# Patient Record
Sex: Male | Born: 1960 | Race: Black or African American | Hispanic: No | Marital: Single | Smoking: Never smoker
Health system: Southern US, Community
[De-identification: ages and names within clinical notes are randomized; demographics above are authoritative.]

## PROBLEM LIST (undated history)

## (undated) DIAGNOSIS — I10 Essential (primary) hypertension: Secondary | ICD-10-CM

## (undated) DIAGNOSIS — I639 Cerebral infarction, unspecified: Secondary | ICD-10-CM

## (undated) DIAGNOSIS — M1990 Unspecified osteoarthritis, unspecified site: Secondary | ICD-10-CM

## (undated) DIAGNOSIS — E119 Type 2 diabetes mellitus without complications: Secondary | ICD-10-CM

## (undated) DIAGNOSIS — M5412 Radiculopathy, cervical region: Secondary | ICD-10-CM

## (undated) DIAGNOSIS — F329 Major depressive disorder, single episode, unspecified: Secondary | ICD-10-CM

## (undated) DIAGNOSIS — I77810 Thoracic aortic ectasia: Secondary | ICD-10-CM

## (undated) DIAGNOSIS — K589 Irritable bowel syndrome without diarrhea: Secondary | ICD-10-CM

## (undated) DIAGNOSIS — I219 Acute myocardial infarction, unspecified: Secondary | ICD-10-CM

## (undated) DIAGNOSIS — K920 Hematemesis: Secondary | ICD-10-CM

## (undated) DIAGNOSIS — D649 Anemia, unspecified: Secondary | ICD-10-CM

## (undated) DIAGNOSIS — K219 Gastro-esophageal reflux disease without esophagitis: Secondary | ICD-10-CM

## (undated) DIAGNOSIS — F32A Depression, unspecified: Secondary | ICD-10-CM

## (undated) DIAGNOSIS — N289 Disorder of kidney and ureter, unspecified: Secondary | ICD-10-CM

## (undated) DIAGNOSIS — K760 Fatty (change of) liver, not elsewhere classified: Secondary | ICD-10-CM

## (undated) DIAGNOSIS — G629 Polyneuropathy, unspecified: Secondary | ICD-10-CM

## (undated) DIAGNOSIS — K279 Peptic ulcer, site unspecified, unspecified as acute or chronic, without hemorrhage or perforation: Secondary | ICD-10-CM

## (undated) DIAGNOSIS — M549 Dorsalgia, unspecified: Secondary | ICD-10-CM

## (undated) DIAGNOSIS — K859 Acute pancreatitis without necrosis or infection, unspecified: Secondary | ICD-10-CM

## (undated) DIAGNOSIS — I82409 Acute embolism and thrombosis of unspecified deep veins of unspecified lower extremity: Secondary | ICD-10-CM

## (undated) DIAGNOSIS — I2510 Atherosclerotic heart disease of native coronary artery without angina pectoris: Secondary | ICD-10-CM

## (undated) DIAGNOSIS — G8929 Other chronic pain: Secondary | ICD-10-CM

## (undated) DIAGNOSIS — I2699 Other pulmonary embolism without acute cor pulmonale: Secondary | ICD-10-CM

## (undated) DIAGNOSIS — T783XXA Angioneurotic edema, initial encounter: Secondary | ICD-10-CM

## (undated) DIAGNOSIS — I5032 Chronic diastolic (congestive) heart failure: Secondary | ICD-10-CM

## (undated) DIAGNOSIS — R0600 Dyspnea, unspecified: Secondary | ICD-10-CM

## (undated) DIAGNOSIS — G4733 Obstructive sleep apnea (adult) (pediatric): Secondary | ICD-10-CM

## (undated) DIAGNOSIS — E785 Hyperlipidemia, unspecified: Secondary | ICD-10-CM

## (undated) HISTORY — PX: OTHER SURGICAL HISTORY: SHX169

## (undated) HISTORY — DX: Hyperlipidemia, unspecified: E78.5

## (undated) HISTORY — DX: Type 2 diabetes mellitus without complications: E11.9

## (undated) HISTORY — DX: Obstructive sleep apnea (adult) (pediatric): G47.33

## (undated) HISTORY — DX: Irritable bowel syndrome, unspecified: K58.9

## (undated) HISTORY — DX: Essential (primary) hypertension: I10

## (undated) HISTORY — DX: Peptic ulcer, site unspecified, unspecified as acute or chronic, without hemorrhage or perforation: K27.9

## (undated) HISTORY — DX: Anemia, unspecified: D64.9

## (undated) HISTORY — DX: Atherosclerotic heart disease of native coronary artery without angina pectoris: I25.10

## (undated) HISTORY — DX: Acute pancreatitis without necrosis or infection, unspecified: K85.90

## (undated) HISTORY — DX: Angioneurotic edema, initial encounter: T78.3XXA

---

## 2000-08-31 ENCOUNTER — Encounter: Payer: Self-pay | Admitting: Emergency Medicine

## 2000-08-31 ENCOUNTER — Emergency Department (HOSPITAL_COMMUNITY): Admission: EM | Admit: 2000-08-31 | Discharge: 2000-08-31 | Payer: Self-pay | Admitting: Emergency Medicine

## 2001-10-30 ENCOUNTER — Emergency Department (HOSPITAL_COMMUNITY): Admission: EM | Admit: 2001-10-30 | Discharge: 2001-10-30 | Payer: Self-pay

## 2002-07-19 ENCOUNTER — Encounter: Payer: Self-pay | Admitting: Emergency Medicine

## 2002-07-19 ENCOUNTER — Emergency Department (HOSPITAL_COMMUNITY): Admission: EM | Admit: 2002-07-19 | Discharge: 2002-07-19 | Payer: Self-pay | Admitting: Emergency Medicine

## 2003-04-07 ENCOUNTER — Emergency Department (HOSPITAL_COMMUNITY): Admission: EM | Admit: 2003-04-07 | Discharge: 2003-04-07 | Payer: Self-pay | Admitting: Emergency Medicine

## 2003-04-07 ENCOUNTER — Encounter: Payer: Self-pay | Admitting: Emergency Medicine

## 2003-05-12 ENCOUNTER — Emergency Department (HOSPITAL_COMMUNITY): Admission: EM | Admit: 2003-05-12 | Discharge: 2003-05-12 | Payer: Self-pay | Admitting: *Deleted

## 2003-05-12 ENCOUNTER — Encounter: Payer: Self-pay | Admitting: Emergency Medicine

## 2003-05-13 ENCOUNTER — Emergency Department (HOSPITAL_COMMUNITY): Admission: EM | Admit: 2003-05-13 | Discharge: 2003-05-14 | Payer: Self-pay | Admitting: Emergency Medicine

## 2003-05-13 ENCOUNTER — Encounter: Payer: Self-pay | Admitting: Emergency Medicine

## 2003-06-30 ENCOUNTER — Emergency Department (HOSPITAL_COMMUNITY): Admission: EM | Admit: 2003-06-30 | Discharge: 2003-06-30 | Payer: Self-pay | Admitting: Emergency Medicine

## 2003-09-16 ENCOUNTER — Emergency Department (HOSPITAL_COMMUNITY): Admission: AD | Admit: 2003-09-16 | Discharge: 2003-09-16 | Payer: Self-pay | Admitting: Family Medicine

## 2003-11-24 ENCOUNTER — Emergency Department (HOSPITAL_COMMUNITY): Admission: EM | Admit: 2003-11-24 | Discharge: 2003-11-25 | Payer: Self-pay | Admitting: Emergency Medicine

## 2003-11-28 ENCOUNTER — Emergency Department (HOSPITAL_COMMUNITY): Admission: EM | Admit: 2003-11-28 | Discharge: 2003-11-28 | Payer: Self-pay | Admitting: Emergency Medicine

## 2003-12-08 ENCOUNTER — Ambulatory Visit (HOSPITAL_COMMUNITY): Admission: RE | Admit: 2003-12-08 | Discharge: 2003-12-08 | Payer: Self-pay | Admitting: Internal Medicine

## 2003-12-09 ENCOUNTER — Inpatient Hospital Stay (HOSPITAL_COMMUNITY): Admission: EM | Admit: 2003-12-09 | Discharge: 2003-12-12 | Payer: Self-pay | Admitting: Emergency Medicine

## 2003-12-10 ENCOUNTER — Encounter (INDEPENDENT_AMBULATORY_CARE_PROVIDER_SITE_OTHER): Payer: Self-pay | Admitting: Specialist

## 2004-03-15 ENCOUNTER — Encounter (INDEPENDENT_AMBULATORY_CARE_PROVIDER_SITE_OTHER): Payer: Self-pay | Admitting: *Deleted

## 2004-03-15 ENCOUNTER — Ambulatory Visit (HOSPITAL_COMMUNITY): Admission: RE | Admit: 2004-03-15 | Discharge: 2004-03-15 | Payer: Self-pay | Admitting: *Deleted

## 2004-07-01 ENCOUNTER — Ambulatory Visit: Payer: Self-pay | Admitting: Family Medicine

## 2004-07-01 ENCOUNTER — Ambulatory Visit: Payer: Self-pay | Admitting: *Deleted

## 2004-07-26 ENCOUNTER — Ambulatory Visit: Payer: Self-pay | Admitting: Family Medicine

## 2007-08-17 ENCOUNTER — Emergency Department (HOSPITAL_COMMUNITY): Admission: EM | Admit: 2007-08-17 | Discharge: 2007-08-17 | Payer: Self-pay | Admitting: *Deleted

## 2007-08-24 ENCOUNTER — Emergency Department (HOSPITAL_COMMUNITY): Admission: EM | Admit: 2007-08-24 | Discharge: 2007-08-24 | Payer: Self-pay | Admitting: Family Medicine

## 2009-09-22 ENCOUNTER — Emergency Department (HOSPITAL_COMMUNITY): Admission: EM | Admit: 2009-09-22 | Discharge: 2009-09-22 | Payer: Self-pay | Admitting: Emergency Medicine

## 2009-10-09 ENCOUNTER — Emergency Department (HOSPITAL_COMMUNITY): Admission: EM | Admit: 2009-10-09 | Discharge: 2009-10-09 | Payer: Self-pay | Admitting: Emergency Medicine

## 2009-10-13 ENCOUNTER — Ambulatory Visit (HOSPITAL_COMMUNITY): Admission: RE | Admit: 2009-10-13 | Discharge: 2009-10-13 | Payer: Self-pay | Admitting: Gastroenterology

## 2009-11-30 ENCOUNTER — Emergency Department (HOSPITAL_COMMUNITY): Admission: EM | Admit: 2009-11-30 | Discharge: 2009-11-30 | Payer: Self-pay | Admitting: Emergency Medicine

## 2009-12-02 ENCOUNTER — Emergency Department (HOSPITAL_COMMUNITY): Admission: EM | Admit: 2009-12-02 | Discharge: 2009-12-02 | Payer: Self-pay | Admitting: Emergency Medicine

## 2009-12-08 ENCOUNTER — Ambulatory Visit: Payer: Self-pay | Admitting: Internal Medicine

## 2009-12-22 ENCOUNTER — Ambulatory Visit: Payer: Self-pay | Admitting: Family Medicine

## 2009-12-23 ENCOUNTER — Encounter (INDEPENDENT_AMBULATORY_CARE_PROVIDER_SITE_OTHER): Payer: Self-pay | Admitting: Family Medicine

## 2009-12-23 ENCOUNTER — Ambulatory Visit: Payer: Self-pay | Admitting: Internal Medicine

## 2009-12-23 LAB — CONVERTED CEMR LAB
ALT: 34 units/L (ref 0–53)
AST: 25 units/L (ref 0–37)
Albumin: 4.6 g/dL (ref 3.5–5.2)
Alkaline Phosphatase: 33 units/L — ABNORMAL LOW (ref 39–117)
BUN: 17 mg/dL (ref 6–23)
Basophils Absolute: 0 10*3/uL (ref 0.0–0.1)
Basophils Relative: 0 % (ref 0–1)
CO2: 26 meq/L (ref 19–32)
Calcium: 9.5 mg/dL (ref 8.4–10.5)
Chloride: 101 meq/L (ref 96–112)
Cholesterol: 213 mg/dL — ABNORMAL HIGH (ref 0–200)
Creatinine, Ser: 0.98 mg/dL (ref 0.40–1.50)
Eosinophils Absolute: 0.1 10*3/uL (ref 0.0–0.7)
Eosinophils Relative: 1 % (ref 0–5)
Glucose, Bld: 98 mg/dL (ref 70–99)
HCT: 40.1 % (ref 39.0–52.0)
HDL: 50 mg/dL (ref >39)
Hemoglobin: 13.4 g/dL (ref 13.0–17.0)
LDL Cholesterol: 102 mg/dL — ABNORMAL HIGH (ref 0–99)
Lymphocytes Relative: 26 % (ref 12–46)
Lymphs Abs: 2.5 10*3/uL (ref 0.7–4.0)
MCHC: 33.4 g/dL (ref 30.0–36.0)
MCV: 93 fL (ref 78.0–100.0)
Monocytes Absolute: 0.8 10*3/uL (ref 0.1–1.0)
Monocytes Relative: 8 % (ref 3–12)
Neutro Abs: 6.1 10*3/uL (ref 1.7–7.7)
Neutrophils Relative %: 64 % (ref 43–77)
Platelets: 293 10*3/uL (ref 150–400)
Potassium: 4.3 meq/L (ref 3.5–5.3)
RBC: 4.31 M/uL (ref 4.22–5.81)
RDW: 13.5 % (ref 11.5–15.5)
Sodium: 140 meq/L (ref 135–145)
Total Bilirubin: 0.4 mg/dL (ref 0.3–1.2)
Total CHOL/HDL Ratio: 4.3
Total Protein: 8.1 g/dL (ref 6.0–8.3)
Triglycerides: 303 mg/dL — ABNORMAL HIGH (ref <150)
VLDL: 61 mg/dL — ABNORMAL HIGH (ref 0–40)
WBC: 9.5 10*3/uL (ref 4.0–10.5)

## 2010-04-05 ENCOUNTER — Encounter (INDEPENDENT_AMBULATORY_CARE_PROVIDER_SITE_OTHER): Payer: Self-pay | Admitting: Family Medicine

## 2010-04-05 ENCOUNTER — Ambulatory Visit: Payer: Self-pay | Admitting: Internal Medicine

## 2010-04-05 LAB — CONVERTED CEMR LAB
BUN: 15 mg/dL (ref 6–23)
CO2: 27 meq/L (ref 19–32)
Calcium: 10 mg/dL (ref 8.4–10.5)
Chloride: 104 meq/L (ref 96–112)
Creatinine, Ser: 0.92 mg/dL (ref 0.40–1.50)
Glucose, Bld: 98 mg/dL (ref 70–99)
Potassium: 4 meq/L (ref 3.5–5.3)
Sodium: 142 meq/L (ref 135–145)

## 2010-04-09 ENCOUNTER — Ambulatory Visit (HOSPITAL_COMMUNITY): Admission: RE | Admit: 2010-04-09 | Discharge: 2010-04-09 | Payer: Self-pay | Admitting: Internal Medicine

## 2010-07-06 ENCOUNTER — Ambulatory Visit: Payer: Self-pay | Admitting: Internal Medicine

## 2011-01-12 LAB — POCT CARDIAC MARKERS
CKMB, poc: 1.1 ng/mL (ref 1.0–8.0)
Myoglobin, poc: 88.4 ng/mL (ref 12–200)
Troponin i, poc: <0.05 ng/mL (ref 0.00–0.09)

## 2011-01-24 LAB — POCT I-STAT, CHEM 8
BUN: 15 mg/dL (ref 6–23)
Calcium, Ion: 1.17 mmol/L (ref 1.12–1.32)
Chloride: 104 mEq/L (ref 96–112)
Creatinine, Ser: 0.8 mg/dL (ref 0.4–1.5)
Glucose, Bld: 99 mg/dL (ref 70–99)
HCT: 43 % (ref 39.0–52.0)
Hemoglobin: 14.6 g/dL (ref 13.0–17.0)
Potassium: 3.9 mEq/L (ref 3.5–5.1)
Sodium: 138 mEq/L (ref 135–145)
TCO2: 27 mmol/L (ref 0–100)

## 2011-01-26 LAB — BASIC METABOLIC PANEL
BUN: 14 mg/dL (ref 6–23)
CO2: 23 mEq/L (ref 19–32)
Calcium: 9.4 mg/dL (ref 8.4–10.5)
Chloride: 104 mEq/L (ref 96–112)
Creatinine, Ser: 0.96 mg/dL (ref 0.4–1.5)
GFR calc Af Amer: >60 mL/min (ref >60)
GFR calc non Af Amer: >60 mL/min (ref >60)
Glucose, Bld: 128 mg/dL — ABNORMAL HIGH (ref 70–99)
Potassium: 3.8 mEq/L (ref 3.5–5.1)
Sodium: 138 mEq/L (ref 135–145)

## 2011-01-26 LAB — TYPE AND SCREEN
ABO/RH(D): O POS
Antibody Screen: NEGATIVE

## 2011-01-26 LAB — CBC
HCT: 40.3 % (ref 39.0–52.0)
Hemoglobin: 14.1 g/dL (ref 13.0–17.0)
MCHC: 35 g/dL (ref 30.0–36.0)
MCV: 94.3 fL (ref 78.0–100.0)
Platelets: 275 10*3/uL (ref 150–400)
RBC: 4.28 MIL/uL (ref 4.22–5.81)
RDW: 13.4 % (ref 11.5–15.5)
WBC: 8.6 10*3/uL (ref 4.0–10.5)

## 2011-01-26 LAB — PROTIME-INR
INR: 0.94 (ref 0.00–1.49)
Prothrombin Time: 12.5 seconds (ref 11.6–15.2)

## 2011-01-26 LAB — DIFFERENTIAL
Basophils Absolute: 0 10*3/uL (ref 0.0–0.1)
Basophils Relative: 0 % (ref 0–1)
Eosinophils Absolute: 0.1 10*3/uL (ref 0.0–0.7)
Eosinophils Relative: 1 % (ref 0–5)
Lymphocytes Relative: 27 % (ref 12–46)
Lymphs Abs: 2.3 10*3/uL (ref 0.7–4.0)
Monocytes Absolute: 0.6 10*3/uL (ref 0.1–1.0)
Monocytes Relative: 8 % (ref 3–12)
Neutro Abs: 5.5 10*3/uL (ref 1.7–7.7)
Neutrophils Relative %: 64 % (ref 43–77)

## 2011-01-26 LAB — HEMOCCULT GUIAC POC 1CARD (OFFICE): Fecal Occult Bld: POSITIVE

## 2011-01-26 LAB — ABO/RH: ABO/RH(D): O POS

## 2011-01-26 LAB — APTT: aPTT: 32 seconds (ref 24–37)

## 2011-02-04 ENCOUNTER — Inpatient Hospital Stay (INDEPENDENT_AMBULATORY_CARE_PROVIDER_SITE_OTHER)
Admission: RE | Admit: 2011-02-04 | Discharge: 2011-02-04 | Disposition: A | Discharge status: Home / Self Care | Payer: Self-pay | Source: Ambulatory Visit | Attending: Emergency Medicine | Admitting: Emergency Medicine

## 2011-02-04 DIAGNOSIS — I10 Essential (primary) hypertension: Secondary | ICD-10-CM

## 2011-02-04 DIAGNOSIS — R51 Headache: Secondary | ICD-10-CM

## 2011-02-04 LAB — GLUCOSE, CAPILLARY: Glucose-Capillary: 106 mg/dL — ABNORMAL HIGH (ref 70–99)

## 2011-03-11 NOTE — Discharge Summary (Signed)
NAMEDALTYN, DEGROAT                        ACCOUNT NO.:  [id number]   MEDICAL RECORD NO.:  [id number]                   PATIENT TYPE:  INP   LOCATION:  6712                                 FACILITY:  [facility]   PHYSICIAN:  Jonna L. Robb Matar, M.D.            DATE OF BIRTH:  Apr 11, 1961   DATE OF ADMISSION:  12/09/2003  DATE OF DISCHARGE:  12/12/2003                                 DISCHARGE SUMMARY   PRIMARY CARE PHYSICIAN:  Unassigned.   FINAL DIAGNOSES:  1. Antral ulcer.  2. Duodenal ulcer.  3. Fatty liver.  4. Gallbladder sludge.  5. Splenic hemangioma.  6. Urinary tract infection.  7. Helicobacter pylori positive.   ALLERGIES:  None.   OPERATION/PROCEDURE:  None.   HISTORY OF PRESENT ILLNESS:  This 50 year old African-American male had a  two to three week history of nausea, vomiting, abdominal pain, and started  to develop coffee-ground vomitus three to four times a day. CT scan on  February 5 showed small splenic lesions. He had and upper GI that was  abnormal. He came back to the emergency room with an elevated white count  and fever and was evaluated. He had elevated LFTs.   Past history is notable for being a recovering drug addict residing at the  [facility].   ADMISSION PHYSICAL EXAMINATION:  VITAL SIGNS:  Temperature 99.0, blood  pressure 188/64.  ABDOMEN:  Unremarkable.   ADMISSION LABORATORY DATA:  White count had gone up to 17.4, positive  esterase and leukocytes.   HOSPITAL COURSE:  The patient's serologic studies for hepatitis came back  normal. He was seen in consultation by Dr. Evette Cristal who did an upper endoscopy  on him, which showed a 3 cm antral ulcer, small duodenal ulcers, and  esophagitis. The patient was in the meantime kept on IV fluids, IV Protonix,  and given Phenergan as needed. Evaluation of he liver and gallbladder with  ultrasound showed sludge and again a lesion in the spleen, which was  probably a benign splenic hemangioma.  The H. pylori test came back positive.   DISPOSITION:  The patient is to be discharge on a Prevpac for two weeks  followed by Prilosec 20 mg daily to fill out a two month course. He has been  instructed to avoid any anti-inflammatories, aspirin, not to eat hot spices.  He can probably return to work in about a week when he is feeling better. I  will ask Dr. Evette Cristal if he needs GI follow-up.                                                Jonna L. Robb Matar, M.D.    Dorna Bloom  D:  12/11/2003  T:  12/13/2003  Job:  [id number]

## 2011-03-11 NOTE — Op Note (Signed)
NAME:  [name], [name]                        ACCOUNT NO.:  [id number]   MEDICAL RECORD NO.:  [id number]                   PATIENT TYPE:  AMB   LOCATION:  ENDO                                 FACILITY:  [facility]   PHYSICIAN:  Graylin Shiver, M.D.                DATE OF BIRTH:  [birth year]   DATE OF PROCEDURE:  03/15/2004  DATE OF DISCHARGE:                                 OPERATIVE REPORT   PROCEDURE:  Esophagogastroduodenoscopy with biopsy.   INDICATIONS FOR PROCEDURE:  This patient is a 50 year old male who was found  to have a 3 cm gastric antral ulcer in February of this year by endoscopy.  He was positive for H. pylori and was treated for same.  He is currently on  Protonix.  The previous biopsies of the antral ulcer showed ulcerated  gastric mucosa with Helicobacter pylori and mild lymphoid atypia.   CONSENT:  Informed consent was obtained after explanation of the risks of  bleeding, infection, and perforation.   PREMEDICATION:  Fentanyl 60 mcg IV, Versed 6 mg IV.   PROCEDURE IN DETAIL:  With the patient in the left lateral decubitus  position, the Olympus gastroscope was inserted into the oropharynx and  passed into the esophagus.  It was advanced down the esophagus, into the  stomach, and into the duodenum.  The second portion of all the duodenum were  normal.  The stomach showed erythematous mucosa compatible with gastritis.  There was scarring in the antrum where the previous ulcer was, but the  ulcer, itself, were healed.  Biopsies were obtained from the area where the  ulcer was and we will check to see whether Helicobacter pylori is still  present or not.  The body of the stomach showed gastritis.  The fundus and  cardia of the stomach was unremarkable.  The esophagus was normal in its  entirety.  He tolerated the procedure well without complications.   IMPRESSION:  1. Healed gastric ulcer.  2. Gastritis.   PLAN:  Biopsies will be checked.                          Graylin Shiver, M.D.    [name]  D:  03/15/2004  T:  03/15/2004  Job:  [id number]

## 2011-03-11 NOTE — Consult Note (Signed)
NAME:  [name], [name]                        ACCOUNT NO.:  [id number]   MEDICAL RECORD NO.:  [id number]                   PATIENT TYPE:  INP   LOCATION:  6712                                 FACILITY:  [facility]   PHYSICIAN:  Graylin Shiver, M.D.                DATE OF BIRTH:  02-Jul-1961   DATE OF CONSULTATION:  12/10/2003  DATE OF DISCHARGE:                                   CONSULTATION   REASON FOR CONSULTATION:  This patient is a 50 year old African-American  male who is currently in the [facility], residing there.  He  is a recovering crack and cocaine addict.  He was admitted to the hospital  on December 09, 2003 because of persistent complaints of nausea and  vomiting, upper abdominal pain.  He has had this problem for the past 1-1/2  weeks.  He has noticed some coffee-grounds emesis at times.  He has been  unable to keep much of anything down.  He denies peptic ulcer disease.   He came to the emergency room a few days ago and was evaluated there.  He  had a CT scan of the abdomen which showed evidence of an old, splenic  infarct or pseudocyst. He had an upper GI series done, however, in talking  to the primary hospitalist, there was some confusion as to the results of  this, and it was unclear as to whether the reading was actually on this  patient or not.  Recently in the emergency room, he did have some elevation  of his liver enzymes as well.  The patient was admitted to the hospital for  further evaluation and treatment of his nausea and vomiting, and upper  abdominal pain.   PAST MEDICAL HISTORY:  No medical problems. He is a recovering crack cocaine  addict, and he has been abstinent since November.   PAST SURGICAL HISTORY:  None.   MEDICATIONS PRIOR TO ADMISSION:  Phenergan, ibuprofen, Amoxil, a proton pump  inhibitor (which he does not know the name).   ALLERGIES:  NONE KNOWN.   SOCIAL HISTORY:  He denies drinking alcohol.   REVIEW OF SYSTEMS:   Has noticed some fever and chills, denies any anginal  chest pains or shortness of breath, cough or sputum production. Denies any  dysuria.   PHYSICAL EXAMINATION:  GENERAL:  He does not appear in any acute distress at  this time.  HEENT:  Nonicteric.  NECK:  Supple.  LUNGS:  Clear.  CARDIOVASCULAR:  Regular rhythm, no murmurs are heard.  ABDOMEN:  Bowel sounds are normal, soft. There is some mild tenderness in  the epigastrium and right upper quadrant but no rebound, guarding or  hepatosplenomegaly.   IMPRESSION:  Nausea and vomiting, upper abdominal pain and some associated  coffee-grounds emesis.   PLAN:  Patient will have EGD today to evaluate the upper GI tract to see if  there is anything going  on that might explain this problem.  Also of  consideration could be his gallbladder. I will also order a gallbladder  ultrasound.  He is currently on Protonix 40 mg IV daily.                                               Graylin Shiver, M.D.    SFG/MEDQ  D:  12/10/2003  T:  12/10/2003  Job:  [id number]   cc:   Hettie Holstein, D.O.  Fax: [phone]

## 2011-03-11 NOTE — H&P (Signed)
NAME:  [name], [name]                        ACCOUNT NO.:  [id number]   MEDICAL RECORD NO.:  [id number]                   PATIENT TYPE:  EMS   LOCATION:  MINO                                 FACILITY:  [facility]   PHYSICIAN:  Hettie Holstein, D.O.                 DATE OF BIRTH:  [birth year]   DATE OF ADMISSION:  12/09/2003  DATE OF DISCHARGE:                                HISTORY & PHYSICAL   CHIEF COMPLAINT:  Intractable nausea, vomiting, inability to eat, and weight  loss.   HISTORY OF PRESENT ILLNESS:  This is a 50 year old African-American male  without remarkable past medical history, who has, for the past 2-3 weeks,  been having increased nausea and vomiting accompanied by abdominal pain that  began with a sore throat.  He recently developed some flecks of dark blood  along with his episodes of vomiting.  He states that he vomits upwards of 3-  4 times daily, irregardless of what he eats, and it is not always in  associated with eating.  He states the dark blood is a very small amount.  It did not start out with blood earlier.  He has been seen in the emergency  department here at [facility] 3 times.  He underwent CT scan on  November 29, 2003 that revealed only a 2.8 x 4 cm low density splenic lesion,  felt to be possibly due to splenic infarction or a pseudocyst related to old  trauma, and subsequently he underwent an upper GI series, though the results  of this are awaiting further clarification, as I have discussed, but not  with the radiologist.  He had some questions with regard to the previous  read, and we await further clarification.  In any event, today he presented  to the emergency department with similar complaints.  He had an elevated  white count and fever, and subsequently is being evaluated for further work-  up.   In review of the records back in his initial evaluation in November 29, 2003,  he had elevated LFT's that have resolved to this point.  They  have been as  high as 105/209 back on November 28, 2003, without elevations in his total  bilirubin.  He continues to remain Hemoccult negative, as tested here in the  emergency department.  His urinalysis is suggestive of a urinary tract  infection at this time, however.  No elevations in lipase or amylase.   PAST MEDICAL HISTORY:  Unremarkable.  He is a recovering crack addict,  residing at [facility].  He has been abstinent since  November.  Contact information is through Nunzio Cory, the director at the  facility there (phone number [phone]).   PAST SURGICAL HISTORY:  He denies any surgery in the past.   MEDICATIONS:  He has been started on his multiple visits on the following -  1. Phenergan.  2. Ibuprofen.  3. Amoxil.  4. A proton pump inhibitor.  5. Flagyl.   ALLERGIES:  He denies any drug allergies.   SOCIAL HISTORY:  Significant for crack cocaine abuse in the past.  He is at  a rehab at this time.  He denies cigarettes.  Denies alcohol.  Denies IV  drugs.  Denies ever being tested for HIV.  He uses condoms occasionally.  He  has 2 heterosexual partners.  He has 5 children, and is separated currently.   FAMILY HISTORY:  His mother is living with epilepsy.  His father died at the  age of 38 with cancer, for which the patient is unable to provide more  information.   REVIEW OF SYSTEMS:  The patient has had weight loss, approximately 10 pounds  in the past 2 weeks, and poor p.o. intake secondary to his vomiting, sore  throat, though he has had a rapid strep screen that was negative.  He has  had some chills and shaking, subjective fevers, and has felt cold.  As noted  above, some episodes of dark flecks of blood in his emesis.  None in his  stool.  No diarrhea, no constipation.  No dysuria.  No prior history of  STD's.  No prior history of tuberculosis.  No childhood food intolerances.   PHYSICAL EXAMINATION:  VITAL SIGNS:  Temperature 99.0 oral, blood  pressure  188/64, heart rate 60, respirations 16, pulse oximetry 98% on room air.  GENERAL:  This is a well-developed African-American male appearing his  stated age in no apparent distress, alert.  HEENT:  Normocephalic and atraumatic.  Extraocular muscles are intact.  No  pallor, no jaundice.  NECK:  Supple, nontender.  No palpable thyromegaly.  CHEST:  Clear bilaterally.  HEART:  Regular S1 and S2 without murmur.  ABDOMEN:  Soft and nontender.  No palpable hepatosplenomegaly.  No rebound.  No CVA tenderness or suprapubic tenderness.  EXTREMITIES:  No edema, no calf tenderness.  Peripheral pulses were  symmetrical.   LABORATORY DATA:  The patient has had earlier this month a negative rapid  strep screen.  Amylase and lipase have persistently been within normal  limits.  Initial comprehensive metabolic panel 2 weeks ago revealed elevated  AST and ALT, as well as a mildly elevated alkaline phosphatase with normal  total bilirubin.  His white count has been slowly coming up to 17.4.  No  bands were noted.  Urinalysis was significant for WBC's, leukocyte esterase,  and leukocytes.  Urine drug screen was negative.   IMAGING STUDIES:  The CT was reported as only the findings stated above.  Upper GI series - awaiting for other clarification by the radiology  department.   IMPRESSION:  1. Intractable nausea and vomiting.  Abnormal LFT's, weight loss.  Question     of duodenitis on prior upper GI study.  Await further clarification.  2. Leukocytosis.  3. Fever.   PLAN:  At this time, I am going to admit Mr. Corine Shelter for further evaluation.  Will send some serologic studies for hepatitis, as well as some stool  studies, though at this time I think he does need a gastroenterologist  consultation.  Will notify them in the morning.  At this time, will just  continue hydration, and wait for the clarification of the upper GI study. We will send H. Pylori studies out, as well as noted above  stool studies.  If these are unremarkable, we may pursue antigliadin and sprue work-up  evaluation.  He  probably will need an upper endoscopy and anti-emetics, and  further plan pending imaging clarification.                                                Hettie Holstein, D.O.    ESS/MEDQ  D:  12/09/2003  T:  12/09/2003  Job:  [id number]

## 2011-03-11 NOTE — H&P (Signed)
NAME:  [name], [name]                        ACCOUNT NO.:  [id number]   MEDICAL RECORD NO.:  [id number]                   PATIENT TYPE:  EMS   LOCATION:  MINO                                 FACILITY:  [facility]   PHYSICIAN:  Hettie Holstein, D.O.                 DATE OF BIRTH:  [birth year]   DATE OF ADMISSION:  12/09/2003  DATE OF DISCHARGE:                                HISTORY & PHYSICAL   ADDENDUM:   DIAGNOSES:  1. Urinary tract infection based on urinalysis.   PLAN OF TREATMENT:  Cipro.                                                Hettie Holstein, D.O.    ESS/MEDQ  D:  12/09/2003  T:  12/09/2003  Job:  [id number]

## 2011-03-11 NOTE — Op Note (Signed)
[name], BORAK                        ACCOUNT NO.:  [id number]   MEDICAL RECORD NO.:  [id number]                   PATIENT TYPE:  INP   LOCATION:  6213                                 FACILITY:  [facility]   PHYSICIAN:  Graylin Shiver, M.D.                DATE OF BIRTH:  1960/10/31   DATE OF PROCEDURE:  12/10/2003  DATE OF DISCHARGE:                                 OPERATIVE REPORT   PROCEDURE:  Esophagogastroduodenoscopy with biopsy.   ENDOSCOPIST:  Graylin Shiver, M.D.   INDICATIONS:  Vomiting, upper abdominal pain.   INFORMED CONSENT:  Informed consent was obtained after explanation of the  risks of bleeding, infection and perforation.   PREMEDICATIONS:  Fentanyl 100 mcg IV, Versed 10 mg IV.   DESCRIPTION OF PROCEDURE:  With the patient in the left lateral decubitus  position, the Olympus gastroscope was inserted into the oropharynx and  passed into the esophagus.  It was advanced down the esophagus and into the  stomach and into the duodenum.  The second portion and bulb of the duodenum  looked normal.  The bulb of the duodenum looked deformed, and there were  some shallow ulcerations present within the bulb itself.  The stomach  revealed a large approximately 3 cm antral ulcer.  Biopsies were obtained  from around the rim of this ulcer.  A CLOtest was also obtained.  The body  of the stomach looked normal.  The fundus and cardia looked normal.  The  esophagus showed linear red streaks in the distal and mid esophagus  consistent with esophagitis.  The proximal esophagus looked normal.  He  tolerated the procedure well without complications.   IMPRESSION:  1. Esophagitis.  2. Large 3 cm antral ulcer.  3. Deformity and ulcerations in the duodenal bulb.   PLAN:  1. Check the biopsies.  2. Check the CLOtest.  3. Continue Protonix 40 mg IV daily.                                               Graylin Shiver, M.D.    SFG/MEDQ  D:  12/10/2003  T:  12/10/2003  Job:   [id number]   cc:   Hettie Holstein, D.O.  Fax: [phone]

## 2013-08-15 DIAGNOSIS — I10 Essential (primary) hypertension: Secondary | ICD-10-CM | POA: Insufficient documentation

## 2014-01-29 ENCOUNTER — Encounter: Payer: Self-pay | Admitting: Internal Medicine

## 2014-01-29 ENCOUNTER — Encounter (HOSPITAL_COMMUNITY): Payer: Self-pay | Admitting: Pharmacy Technician

## 2014-01-29 ENCOUNTER — Ambulatory Visit
Admission: RE | Admit: 2014-01-29 | Discharge: 2014-01-29 | Disposition: A | Discharge status: Home / Self Care | Payer: No Typology Code available for payment source | Source: Ambulatory Visit | Attending: Internal Medicine | Admitting: Internal Medicine

## 2014-01-29 ENCOUNTER — Ambulatory Visit (INDEPENDENT_AMBULATORY_CARE_PROVIDER_SITE_OTHER): Payer: No Typology Code available for payment source | Admitting: Internal Medicine

## 2014-01-29 VITALS — BP 136/80 | HR 85 | Ht 70.0 in | Wt 317.4 lb

## 2014-01-29 DIAGNOSIS — R9439 Abnormal result of other cardiovascular function study: Secondary | ICD-10-CM

## 2014-01-29 DIAGNOSIS — R0683 Snoring: Secondary | ICD-10-CM

## 2014-01-29 DIAGNOSIS — I10 Essential (primary) hypertension: Secondary | ICD-10-CM | POA: Insufficient documentation

## 2014-01-29 DIAGNOSIS — IMO0002 Reserved for concepts with insufficient information to code with codable children: Secondary | ICD-10-CM

## 2014-01-29 DIAGNOSIS — E1365 Other specified diabetes mellitus with hyperglycemia: Secondary | ICD-10-CM | POA: Insufficient documentation

## 2014-01-29 DIAGNOSIS — I429 Cardiomyopathy, unspecified: Secondary | ICD-10-CM

## 2014-01-29 DIAGNOSIS — E785 Hyperlipidemia, unspecified: Secondary | ICD-10-CM

## 2014-01-29 DIAGNOSIS — E1142 Type 2 diabetes mellitus with diabetic polyneuropathy: Secondary | ICD-10-CM

## 2014-01-29 DIAGNOSIS — Z01818 Encounter for other preprocedural examination: Secondary | ICD-10-CM

## 2014-01-29 DIAGNOSIS — R079 Chest pain, unspecified: Secondary | ICD-10-CM

## 2014-01-29 DIAGNOSIS — R0989 Other specified symptoms and signs involving the circulatory and respiratory systems: Secondary | ICD-10-CM

## 2014-01-29 DIAGNOSIS — R5383 Other fatigue: Secondary | ICD-10-CM

## 2014-01-29 DIAGNOSIS — D689 Coagulation defect, unspecified: Secondary | ICD-10-CM

## 2014-01-29 DIAGNOSIS — E1342 Other specified diabetes mellitus with diabetic polyneuropathy: Secondary | ICD-10-CM

## 2014-01-29 DIAGNOSIS — R5381 Other malaise: Secondary | ICD-10-CM

## 2014-01-29 DIAGNOSIS — E1349 Other specified diabetes mellitus with other diabetic neurological complication: Secondary | ICD-10-CM

## 2014-01-29 DIAGNOSIS — E119 Type 2 diabetes mellitus without complications: Secondary | ICD-10-CM | POA: Insufficient documentation

## 2014-01-29 DIAGNOSIS — R0789 Other chest pain: Secondary | ICD-10-CM | POA: Insufficient documentation

## 2014-01-29 DIAGNOSIS — R0609 Other forms of dyspnea: Secondary | ICD-10-CM

## 2014-01-29 DIAGNOSIS — I428 Other cardiomyopathies: Secondary | ICD-10-CM

## 2014-01-29 LAB — CBC
HCT: 37.1 % — ABNORMAL LOW (ref 39.0–52.0)
Hemoglobin: 12.9 g/dL — ABNORMAL LOW (ref 13.0–17.0)
MCH: 30.9 pg (ref 26.0–34.0)
MCHC: 34.8 g/dL (ref 30.0–36.0)
MCV: 89 fL (ref 78.0–100.0)
Platelets: 242 10*3/uL (ref 150–400)
RBC: 4.17 MIL/uL — ABNORMAL LOW (ref 4.22–5.81)
RDW: 14.6 % (ref 11.5–15.5)
WBC: 7.4 10*3/uL (ref 4.0–10.5)

## 2014-01-29 LAB — BASIC METABOLIC PANEL
BUN: 14 mg/dL (ref 6–23)
CO2: 24 mEq/L (ref 19–32)
Calcium: 10 mg/dL (ref 8.4–10.5)
Chloride: 98 mEq/L (ref 96–112)
Creat: 0.8 mg/dL (ref 0.50–1.35)
Glucose, Bld: 178 mg/dL — ABNORMAL HIGH (ref 70–99)
Potassium: 4.1 mEq/L (ref 3.5–5.3)
Sodium: 138 mEq/L (ref 135–145)

## 2014-01-29 LAB — TSH: TSH: 1.82 u[IU]/mL (ref 0.350–4.500)

## 2014-01-29 NOTE — Progress Notes (Signed)
OFFICE NOTE  Chief Complaint:  Recurrent chest pain  Primary Care Physician: Marry Guan  HPI:  [name] is a very pleasant 53 year old male with unfortunately numerous medical problems. He has poorly controlled diabetes with hemoglobin A1c greater than 9. He says marked dyslipidemia with high triglycerides. In addition he probably has sleep apnea and recently underwent a sleep study. There is peripheral neuropathy with lower extremity edema. He also has hypertension and there is a family history of coronary disease. Recently he's been having problems with chest pain. He describes the pain as sharp and almost knifelike in the left upper chest, occasionally in the back. He was admitted to [facility] in March of 2015 and ruled out for MI. His pain was felt to be atypical in light of the fact he had a negative nuclear stress test in February of 2015. The study was interpreted as low risk with no vertebral ischemia however EF was 47% and there was inferior hypokinesis. This is certainly not normal. His EKG demonstrates normal sinus rhythm with a rightward axis and a rate of 85.  He is seeing me today because of ongoing chest pain complaints.  PMHx:  Past Medical History  Diagnosis Date  . Type 2 diabetes mellitus   . Hypertension   . Peptic ulcer     History reviewed. No pertinent past surgical history.  FAMHx:  Family History  Problem Relation Age of Onset  . Hypertension Mother   . Diabetes Mother   . Cancer Father   . Hypertension Brother   . Hypertension Sister     SOCHx:   reports that he has never smoked. He has never used smokeless tobacco. He reports that he does not drink alcohol or use illicit drugs.  ALLERGIES:  Allergies  Allergen Reactions  . Ibuprofen     Made gastric ulcers worse    ROS: A comprehensive review of systems was negative except for: Respiratory: positive for dyspnea on exertion Cardiovascular: positive for exertional chest  pressure/discomfort and lower extremity edema  HOME MEDS: Current Outpatient Prescriptions  Medication Sig Dispense Refill  . amitriptyline (ELAVIL) 25 MG tablet Take 25 mg by mouth at bedtime.      . cloNIDine (CATAPRES) 0.1 MG tablet Take 0.1 mg by mouth 2 (two) times daily.      . cyclobenzaprine (FLEXERIL) 10 MG tablet Take 10 mg by mouth 3 (three) times daily as needed for muscle spasms.      Marland Kitchen esomeprazole (NEXIUM) 40 MG capsule Take 40 mg by mouth 2 (two) times daily before a meal.      . fenofibrate (TRICOR) 145 MG tablet Take 145 mg by mouth daily.      Marland Kitchen glipiZIDE (GLUCOTROL) 5 MG tablet Take 5 mg by mouth daily before breakfast.      . hydrALAZINE (APRESOLINE) 50 MG tablet Take 50 mg by mouth 3 (three) times daily.      Marland Kitchen lisinopril (PRINIVIL,ZESTRIL) 40 MG tablet Take 40 mg by mouth daily.      . metoprolol succinate (TOPROL-XL) 100 MG 24 hr tablet Take 100 mg by mouth daily. Take with or immediately following a meal.      . OXYCODONE-ACETAMINOPHEN PO Take by mouth as needed.      . promethazine (PHENERGAN) 25 MG tablet Take 25 mg by mouth every 6 (six) hours as needed for nausea or vomiting.      . sitaGLIPtin (JANUVIA) 100 MG tablet Take 100 mg by mouth  daily.       No current facility-administered medications for this visit.    LABS/IMAGING: No results found for this or any previous visit (from the past 48 hour(s)). No results found.  VITALS: BP 136/80  Pulse 85  Ht 5\' 10"  (1.778 m)  Wt 317 lb 6.4 oz (143.972 kg)  BMI 45.54 kg/m2  EXAM: General appearance: alert, no distress and morbidly obese Neck: no carotid bruit and no JVD Lungs: clear to auscultation bilaterally Heart: regular rate and rhythm, S1, S2 normal, no murmur, click, rub or gallop Abdomen: soft, non-tender; bowel sounds normal; no masses,  no organomegaly Extremities: edema 1+ edema Pulses: 2+ and symmetric Skin: Skin color, texture, turgor normal. No rashes or lesions Neurologic: Grossly  normal Psych: Pleasant  EKG: Normal sinus rhythm at 85, rightward axis  ASSESSMENT: 1. Persistent chest pain 2. Abnormal nuclear stress test with "normal perfusion" but reduced EF of 47% with inferior hypokinesis 3. Multiple cardiac risk factors 4. Poorly controlled insulin-dependent diabetes with peripheral neuropathy 5. Dyslipidemia 6. Hypertension 7. Morbid obesity 8. Probable obstructive sleep apnea 9. Family history coronary disease  PLAN: 1.   Mr. Tremaine continues to have chest pain although it is somewhat atypical, he does report decreased exercise tolerance, increasing shortness of breath and easier fatigue. He has attributed this to weight gain however I'm concerned this is a manifestation of coronary artery disease. He is nuclear stress test demonstrated "normal" perfusion however I'm concerned this could represent balanced ischemia or left main/high-grade LAD disease. There is inferior hypokinesis and his EF is reduced at 47%. It is incumbent on [place] to explain why his EF has been reduced. Coronary disease is the most likely explanation, although poorly controlled hypertension could be responsible. Based on this a definitive heart catheterization is recommended. I discussed the risk and benefits of this procedure in the office today and is agreeable to proceed. Hopefully we can perform this through a radial approach.  Thank you again for the kind referral. I'll keep you informed the results of his heart catheterization, which should be able to be performed the next few weeks.  Pixie Casino, MD, [facility] Attending Cardiologist [place] 01/29/2014, 1:35 PM

## 2014-01-29 NOTE — Patient Instructions (Signed)
Your physician has requested that you have a cardiac catheterization with Dr. Debara Pickett if possible. Cardiac catheterization is used to diagnose and/or treat various heart conditions. Doctors may recommend this procedure for a number of different reasons. The most common reason is to evaluate chest pain. Chest pain can be a symptom of coronary artery disease (CAD), and cardiac catheterization can show whether plaque is narrowing or blocking your heart's arteries. This procedure is also used to evaluate the valves, as well as measure the blood flow and oxygen levels in different parts of your heart. For further information please visit [email or web address]. Please follow instruction sheet, as given.  You will need to have blood work & a chest x-ray 3-5 days prior to this procedure.  Please go to [phone] do not need an appointment

## 2014-01-30 ENCOUNTER — Encounter: Payer: Self-pay | Admitting: Internal Medicine

## 2014-01-30 LAB — PROTIME-INR
INR: 0.95 (ref <1.50)
Prothrombin Time: 12.6 seconds (ref 11.6–15.2)

## 2014-01-30 LAB — APTT: aPTT: 30 seconds (ref 24–37)

## 2014-02-03 ENCOUNTER — Ambulatory Visit (HOSPITAL_COMMUNITY)
Admission: RE | Admit: 2014-02-03 | Discharge: 2014-02-03 | Disposition: A | Discharge status: Home / Self Care | Payer: No Typology Code available for payment source | Source: Ambulatory Visit | Attending: Internal Medicine | Admitting: Internal Medicine

## 2014-02-03 ENCOUNTER — Encounter (HOSPITAL_COMMUNITY)
Admission: RE | Disposition: A | Discharge status: Home / Self Care | Payer: Self-pay | Source: Ambulatory Visit | Attending: Internal Medicine

## 2014-02-03 DIAGNOSIS — I10 Essential (primary) hypertension: Secondary | ICD-10-CM | POA: Insufficient documentation

## 2014-02-03 DIAGNOSIS — R079 Chest pain, unspecified: Secondary | ICD-10-CM

## 2014-02-03 DIAGNOSIS — R9439 Abnormal result of other cardiovascular function study: Secondary | ICD-10-CM

## 2014-02-03 DIAGNOSIS — I2510 Atherosclerotic heart disease of native coronary artery without angina pectoris: Secondary | ICD-10-CM

## 2014-02-03 DIAGNOSIS — R0989 Other specified symptoms and signs involving the circulatory and respiratory systems: Secondary | ICD-10-CM | POA: Insufficient documentation

## 2014-02-03 DIAGNOSIS — I428 Other cardiomyopathies: Secondary | ICD-10-CM | POA: Insufficient documentation

## 2014-02-03 DIAGNOSIS — I429 Cardiomyopathy, unspecified: Secondary | ICD-10-CM

## 2014-02-03 DIAGNOSIS — E785 Hyperlipidemia, unspecified: Secondary | ICD-10-CM | POA: Insufficient documentation

## 2014-02-03 DIAGNOSIS — G609 Hereditary and idiopathic neuropathy, unspecified: Secondary | ICD-10-CM | POA: Insufficient documentation

## 2014-02-03 DIAGNOSIS — R0609 Other forms of dyspnea: Secondary | ICD-10-CM | POA: Insufficient documentation

## 2014-02-03 DIAGNOSIS — E119 Type 2 diabetes mellitus without complications: Secondary | ICD-10-CM

## 2014-02-03 HISTORY — PX: LEFT HEART CATHETERIZATION WITH CORONARY ANGIOGRAM: SHX5451

## 2014-02-03 LAB — GLUCOSE, CAPILLARY
Glucose-Capillary: 178 mg/dL — ABNORMAL HIGH (ref 70–99)
Glucose-Capillary: 159 mg/dL — ABNORMAL HIGH (ref 70–99)

## 2014-02-03 SURGERY — LEFT HEART CATHETERIZATION WITH CORONARY ANGIOGRAM
Anesthesia: LOCAL

## 2014-02-03 MED ORDER — HEPARIN (PORCINE) IN NACL 2-0.9 UNIT/ML-% IJ SOLN
INTRAMUSCULAR | Status: AC
  Filled 2014-02-03: qty 1000

## 2014-02-03 MED ORDER — LISINOPRIL 40 MG PO TABS
40.0000 mg | ORAL_TABLET | Freq: Every day | ORAL | Status: DC
  Administered 2014-02-03: 40 mg via ORAL
  Filled 2014-02-03: qty 1

## 2014-02-03 MED ORDER — SODIUM CHLORIDE 0.9 % IV SOLN: 1.0000 mL/kg/h | INTRAVENOUS | Status: DC

## 2014-02-03 MED ORDER — HYDRALAZINE HCL 50 MG PO TABS
50.0000 mg | ORAL_TABLET | Freq: Three times a day (TID) | ORAL | Status: DC
  Administered 2014-02-03: 50 mg via ORAL
  Filled 2014-02-03 (×2): qty 1

## 2014-02-03 MED ORDER — HYDROCODONE-ACETAMINOPHEN 5-325 MG PO TABS
2.0000 | ORAL_TABLET | Freq: Once | ORAL | Status: AC
  Administered 2014-02-03: 2 via ORAL

## 2014-02-03 MED ORDER — VERAPAMIL HCL 2.5 MG/ML IV SOLN
INTRAVENOUS | Status: AC
  Filled 2014-02-03: qty 2

## 2014-02-03 MED ORDER — HYDRALAZINE HCL 20 MG/ML IJ SOLN
10.0000 mg | INTRAMUSCULAR | Status: DC | PRN
  Filled 2014-02-03: qty 1

## 2014-02-03 MED ORDER — ASPIRIN 81 MG PO CHEW
81.0000 mg | CHEWABLE_TABLET | ORAL | Status: AC
  Administered 2014-02-03: 81 mg via ORAL
  Filled 2014-02-03: qty 1

## 2014-02-03 MED ORDER — FUROSEMIDE 10 MG/ML IJ SOLN
20.0000 mg | Freq: Once | INTRAMUSCULAR | Status: AC
  Administered 2014-02-03: 13:00:00 via INTRAVENOUS
  Filled 2014-02-03: qty 2

## 2014-02-03 MED ORDER — CLONIDINE HCL 0.1 MG PO TABS
0.1000 mg | ORAL_TABLET | Freq: Two times a day (BID) | ORAL | Status: DC
  Administered 2014-02-03: 0.1 mg via ORAL
  Filled 2014-02-03: qty 1

## 2014-02-03 MED ORDER — LIDOCAINE HCL (PF) 1 % IJ SOLN
INTRAMUSCULAR | Status: AC
  Filled 2014-02-03: qty 30

## 2014-02-03 MED ORDER — SODIUM CHLORIDE 0.9 % IJ SOLN: 3.0000 mL | INTRAMUSCULAR | Status: DC | PRN

## 2014-02-03 MED ORDER — NITROGLYCERIN 0.2 MG/ML ON CALL CATH LAB
INTRAVENOUS | Status: AC
  Filled 2014-02-03: qty 1

## 2014-02-03 MED ORDER — MIDAZOLAM HCL 2 MG/2ML IJ SOLN
INTRAMUSCULAR | Status: AC
  Filled 2014-02-03: qty 2

## 2014-02-03 MED ORDER — CLONIDINE HCL 0.1 MG PO TABS: 0.1000 mg | ORAL_TABLET | Freq: Two times a day (BID) | ORAL | Status: DC

## 2014-02-03 MED ORDER — HEPARIN SODIUM (PORCINE) 1000 UNIT/ML IJ SOLN
INTRAMUSCULAR | Status: AC
Start: 2014-02-03 — End: 2014-02-03
  Filled 2014-02-03: qty 1

## 2014-02-03 MED ORDER — HYDRALAZINE HCL 20 MG/ML IJ SOLN
INTRAMUSCULAR | Status: AC
  Filled 2014-02-03: qty 1

## 2014-02-03 MED ORDER — FENTANYL CITRATE 0.05 MG/ML IJ SOLN
INTRAMUSCULAR | Status: AC
  Filled 2014-02-03: qty 2

## 2014-02-03 MED ORDER — METOPROLOL SUCCINATE ER 100 MG PO TB24
100.0000 mg | ORAL_TABLET | Freq: Every day | ORAL | Status: DC
  Administered 2014-02-03: 100 mg via ORAL
  Filled 2014-02-03 (×2): qty 1

## 2014-02-03 MED ORDER — HYDROCODONE-ACETAMINOPHEN 5-325 MG PO TABS
ORAL_TABLET | ORAL | Status: AC
  Filled 2014-02-03: qty 2

## 2014-02-03 MED ORDER — SODIUM CHLORIDE 0.9 % IV SOLN
INTRAVENOUS | Status: DC
  Administered 2014-02-03: 08:00:00 via INTRAVENOUS

## 2014-02-03 NOTE — Discharge Instructions (Signed)

## 2014-02-03 NOTE — H&P (Signed)
     INTERVAL PROCEDURE H&P  History and Physical Interval Note:  02/03/2014 7:54 AM  [name] has presented today for their planned procedure. The various methods of treatment have been discussed with the patient and family. After consideration of risks, benefits and other options for treatment, the patient has consented to the procedure.  The patients' outpatient history has been reviewed, patient examined, and no change in status from most recent office note within the past 30 days. I have reviewed the patients' chart and labs and will proceed as planned. Questions were answered to the patient's satisfaction.   Cath Lab Visit (complete for each Cath Lab visit)  Clinical Evaluation Leading to the Procedure:   ACS: no  Non-ACS:    Anginal Classification: CCS III  Anti-ischemic medical therapy: Maximal Therapy (2 or more classes of medications)  Non-Invasive Test Results: Intermediate-risk stress test findings: cardiac mortality 1-3%/year  Prior CABG: No previous CABG  Pixie Casino, MD, [facility] Attending Cardiologist [place] 02/03/2014, 7:54 AM

## 2014-02-03 NOTE — Progress Notes (Signed)
C/O FEELING LIKE CAN'T BREATH WHEN GOES TO SLEEP AND CHRIS,PA NOTIFIED AND ORDERS NOTED

## 2014-02-03 NOTE — Progress Notes (Signed)
    S:  CTSP 2/2 mild dyspnea.  He is s/p cath earlier today-> nonobs dzs, EDP 16 mmHg -> Med rx.  No chest pain.  No r radial complaints.  O:   Filed Vitals:   02/03/14 1215  BP: 168/83  Pulse: 104  Temp:   Resp: 18   Pleasant, nad, aaox3.  Neck - obese - difficult to assess JVP.  Lungs cta, cor rrr, abd protuberant, nd, bs+x4, ext, no cce.  R radial cath site w/o bleeding/bruit/hematoma.  R radial 2+.  A/P:  1.  Acute dyspnea:  Mild.  Lungs are clear.  He is hypertensive and has yet to receive his AM meds.  These have been ordered through pharmacy.  EDP was sl elevated @ 16.  In setting of resting dyspnea, will provide one dose of lasix 20mg  IV now.    2.  HTN:  Resume home meds while here.    3.  Dispo:  D/c as previously planned once bp down and feeling better.  Murray Hodgkins, NP

## 2014-02-03 NOTE — Progress Notes (Signed)
UP AND WALKED AND TOL WELL; CHRIS BERGE,PA NOTIFIED OF B/P,HR, AND STATES FEELS BETTER

## 2014-02-03 NOTE — Progress Notes (Signed)
OK TO D/C HOME PER CHRIS,PA

## 2014-02-03 NOTE — CV Procedure (Signed)
CARDIAC CATHETERIZATION REPORT  [name]   [id number] July 24, 1961  Performing Cardiologist: Pixie Casino Primary Physician: Marry Guan Primary Cardiologist:  [facility]  Procedures Performed:  Left Heart Catheterization via 5 Fr right radial artery access  Native Coronary Angiography  Indication(s): chest pain  Pre-Procedural Diagnosis(es):  1. Cardiomyopathy, EF 47% 2. "Normal perfusion" by stress test 3. Persistent chest pain  Post-Procedural Diagnosis(es): 1. Moderate, non-obstructive CAD  Pre-Procedural Non-invasive testing: Normal perfusion, EF 47%  History: 53 y.o. male with unfortunately numerous medical problems. He has poorly controlled diabetes with hemoglobin A1c greater than 9. He says marked dyslipidemia with high triglycerides. In addition he probably has sleep apnea and recently underwent a sleep study. There is peripheral neuropathy with lower extremity edema. He also has hypertension and there is a family history of coronary disease. Recently he's been having problems with chest pain. He describes the pain as sharp and almost knifelike in the left upper chest, occasionally in the back. He was admitted to [facility] in March of 2015 and ruled out for MI. His pain was felt to be atypical in light of the fact he had a negative nuclear stress test in February of 2015. The study was interpreted as low risk with no vertebral ischemia however EF was 47% and there was inferior hypokinesis. This is certainly not normal. His EKG demonstrates normal sinus rhythm with a rightward axis and a rate of 85. He is seeing me today because of ongoing chest pain complaints.  Based on his ongoing symptoms and reduced EF, I recommended definitive cardiac catheterization.  Risks / Complications include, but not limited to: Death, MI, CVA/TIA, VF/VT (with defibrillation), Bradycardia (need for temporary pacer placement), contrast induced nephropathy, bleeding / bruising /  hematoma / pseudoaneurysm, vascular or coronary injury (with possible emergent CT or Vascular Surgery), adverse medication reactions, infection.    Consent: Risks of procedure as well as the alternatives and risks of each were explained to the (patient/caregiver).  Consent for procedure obtained.  Procedure: The patient was brought to the [facility] in the fasting state and prepped and draped in the usual sterile fashion for (Right radial) access. A modified Allen's test with plethysmography was performed on the right wrist demonstrating adequate Ulnar Artery collateral flow.    Time Out: Verified patient identification, verified procedure, site/side was marked, verified correct patient position, special equipment/implants available, radiation safety measures in place (including badges and shielding), medications/allergies/relevent history reviewed, required imaging and test results available.  Performed  Procedure: The right wrist was anesthetized with 1% subcutaneous Lidocaine.  The right radial artery was accessed using the Seldinger Technique with placement of a 6 Fr Glide Sheath. The sheath was aspirated and flushed.  Then a total of 10 ml of standard Radial Artery Cocktail (see medications) was infused.  A 5 Fr TIG 4.0 Catheter was advanced of over a Safety J wire into the ascending Aorta.  The catheter was used to engage the left and right coronary artery as well as sample LV pressure.  Multiple cineangiographic views of the left and right coronary artery system(s) were performed. LV hemodynamics were measured and the catheter was pulled back across the Aortic Valve for measurement of "pull-back" gradient. The LAD was noted to stream dye and was not adequately filled, therefore, the TIG was exchanged for a 46F easy rad left catheter. Multiple images were re-acquired.  The catheter and the wire were then removed completely out of  the body.  The sheath was  removed in the Cath Lab with a TR band placed at 16 ml Air at 10:40 am (time).  Reverse Allen's test did  reveal non-occlusive hemostasis.  Recovery: The patient was transported to the cath lab holding area in stable condition.   The patient  was stable before, during and following the procedure.   Patient did tolerate procedure well. There were not complications.  EBL: Minimal  Medications:  Premedication: none  Sedation:  2 mg IV Versed, 50 mcg IV Fentanyl  Contrast:  75 ml Omnipaque  Local Anesthesia: 3 cc 1% lidocaine  5000 IU of IV heparin  10 cc Radial Cocktail  20 mg IV Hydralazine  Hemodynamics:  Central Aortic Pressure / Mean Aortic Pressure: 132/97  LV Pressure / LV End diastolic Pressure:  16  Coronary Angiographic Data:  Left Main:  Normal  Left Anterior Descending (LAD):  Coarses down the anterior wall around the apex. There is a moderate ~50% stenosis at the LAD/D1 bifurcation, where the LAD takes a sharp bend. Otherwise, there are mild luminal irregularities. This is a large caliber vessel.  Large 1st septal perforator.  1st diagonal (D1):  Mild to moderate ostial disease, but otherwise no stenosis.    Circumflex (LCx):  Makes a corkscrew bend proximally, but no significant stenosis.    Right Coronary Artery: Mild luminal irregularities. Dominant.  right ventricle branch of right coronary artery: Normal  posterior descending artery: No significant stenosis.  posterior lateral branch:  No significant stenosis.  Impression: 1.  Moderate bifurcation disease at the LAD/D1 branchpoint. 2.  Uncontrolled hypertension - (patient ran out of clonidine last week) 3.  New complaints of right upper quadrant abdominal pain  Plan: 1.  While there is moderate LAD disease, it does not appear flow limiting. His nuclear stress test was normal. This would not be balanced ischemia as LCx and RCA circulation are normal, therefore, the LAD lesion is not significant. 2.  I  suspect his pain may be due to biliary disease - would be worthwhile pursing an abdominal ultrasound through his PCP. 3.  He needs better BP control - I advised him that he cannot run out of or stop taking clonidine due to rebound hypertension. I will provide and Rx for him today and he should get it filled and take another dose tonight. 4.  Follow-up with me in a few weeks for BP and radial cath site check.  The case and results was discussed with the patient and family if available.  The case and results was not discussed with the patient's PCP. The case and results was discussed with the patient's Cardiologist.  Time Spent Directly with the Patient:  60 minutes  Pixie Casino, MD, [facility] Attending Cardiologist [place] 02/03/2014, 10:57 AM

## 2014-02-05 ENCOUNTER — Telehealth: Payer: Self-pay | Admitting: *Deleted

## 2014-02-10 ENCOUNTER — Ambulatory Visit (INDEPENDENT_AMBULATORY_CARE_PROVIDER_SITE_OTHER): Payer: No Typology Code available for payment source | Admitting: Cardiovascular Disease

## 2014-02-10 ENCOUNTER — Encounter: Payer: Self-pay | Admitting: Cardiology

## 2014-02-10 VITALS — BP 147/92 | HR 80 | Ht 70.0 in | Wt 319.6 lb

## 2014-02-10 DIAGNOSIS — R079 Chest pain, unspecified: Secondary | ICD-10-CM

## 2014-02-10 NOTE — Patient Instructions (Signed)
Follow up with Dr Debara Pickett in 1 month

## 2014-02-10 NOTE — Progress Notes (Signed)
The patient returns today for right radial catheterization site check. He was on Cecilie Kicks registered nurse practitioners schedule her for such she was not here today. The heart cath was performed on 02/03/14 and showed some LAD/diagonal branch disease with ejection fraction of about 50%. His right radial puncture site is well-healed. I will arrange for him to Dr. Eber Jones be back in approximately one month for further discussion of his catheterization results and medications.  Lorretta Harp, M.D., [place], [facility], [place] [address] [place], [place]  [id number]  [phone] 02/10/2014 10:06 AM

## 2014-02-19 ENCOUNTER — Ambulatory Visit: Payer: Self-pay | Admitting: Cardiology

## 2014-02-28 ENCOUNTER — Ambulatory Visit: Payer: No Typology Code available for payment source | Admitting: Internal Medicine

## 2014-02-28 NOTE — Telephone Encounter (Signed)
Encounter Closed---5/8 TP 

## 2014-03-19 ENCOUNTER — Ambulatory Visit: Payer: No Typology Code available for payment source | Admitting: Internal Medicine

## 2014-03-21 DIAGNOSIS — K861 Other chronic pancreatitis: Secondary | ICD-10-CM | POA: Insufficient documentation

## 2014-03-21 DIAGNOSIS — R609 Edema, unspecified: Secondary | ICD-10-CM | POA: Insufficient documentation

## 2014-03-28 ENCOUNTER — Emergency Department (HOSPITAL_COMMUNITY)
Admission: EM | Admit: 2014-03-28 | Discharge: 2014-03-28 | Discharge status: Against Medical Advice | Payer: No Typology Code available for payment source | Attending: Emergency Medicine | Admitting: Emergency Medicine

## 2014-03-28 ENCOUNTER — Emergency Department (HOSPITAL_COMMUNITY): Payer: No Typology Code available for payment source

## 2014-03-28 ENCOUNTER — Encounter (HOSPITAL_COMMUNITY): Payer: Self-pay | Admitting: Emergency Medicine

## 2014-03-28 DIAGNOSIS — E119 Type 2 diabetes mellitus without complications: Secondary | ICD-10-CM | POA: Insufficient documentation

## 2014-03-28 DIAGNOSIS — R200 Anesthesia of skin: Secondary | ICD-10-CM

## 2014-03-28 DIAGNOSIS — K279 Peptic ulcer, site unspecified, unspecified as acute or chronic, without hemorrhage or perforation: Secondary | ICD-10-CM | POA: Insufficient documentation

## 2014-03-28 DIAGNOSIS — R209 Unspecified disturbances of skin sensation: Secondary | ICD-10-CM | POA: Insufficient documentation

## 2014-03-28 DIAGNOSIS — Z79899 Other long term (current) drug therapy: Secondary | ICD-10-CM | POA: Insufficient documentation

## 2014-03-28 DIAGNOSIS — I10 Essential (primary) hypertension: Secondary | ICD-10-CM | POA: Insufficient documentation

## 2014-03-28 LAB — RAPID URINE DRUG SCREEN, HOSP PERFORMED
Amphetamines: NOT DETECTED
Barbiturates: NOT DETECTED
Benzodiazepines: NOT DETECTED
Cocaine: NOT DETECTED
Opiates: NOT DETECTED
Tetrahydrocannabinol: NOT DETECTED

## 2014-03-28 LAB — URINE MICROSCOPIC-ADD ON

## 2014-03-28 LAB — DIFFERENTIAL
Basophils Absolute: 0 10*3/uL (ref 0.0–0.1)
Basophils Relative: 0 % (ref 0–1)
Eosinophils Absolute: 0.1 10*3/uL (ref 0.0–0.7)
Eosinophils Relative: 2 % (ref 0–5)
Lymphocytes Relative: 25 % (ref 12–46)
Lymphs Abs: 1.9 10*3/uL (ref 0.7–4.0)
Monocytes Absolute: 0.6 10*3/uL (ref 0.1–1.0)
Monocytes Relative: 8 % (ref 3–12)
Neutro Abs: 4.9 10*3/uL (ref 1.7–7.7)
Neutrophils Relative %: 65 % (ref 43–77)

## 2014-03-28 LAB — I-STAT CHEM 8, ED
BUN: 10 mg/dL (ref 6–23)
Calcium, Ion: 1.19 mmol/L (ref 1.12–1.23)
Chloride: 99 mEq/L (ref 96–112)
Creatinine, Ser: 0.8 mg/dL (ref 0.50–1.35)
Glucose, Bld: 181 mg/dL — ABNORMAL HIGH (ref 70–99)
HCT: 43 % (ref 39.0–52.0)
Hemoglobin: 14.6 g/dL (ref 13.0–17.0)
Potassium: 3.4 mEq/L — ABNORMAL LOW (ref 3.7–5.3)
Sodium: 138 mEq/L (ref 137–147)
TCO2: 27 mmol/L (ref 0–100)

## 2014-03-28 LAB — URINALYSIS, ROUTINE W REFLEX MICROSCOPIC
Bilirubin Urine: NEGATIVE
Glucose, UA: >1000 mg/dL — AB
Hgb urine dipstick: NEGATIVE
Ketones, ur: 15 mg/dL — AB
Leukocytes, UA: NEGATIVE
Nitrite: NEGATIVE
Protein, ur: 30 mg/dL — AB
Specific Gravity, Urine: 1.026 (ref 1.005–1.030)
Urobilinogen, UA: 0.2 mg/dL (ref 0.0–1.0)
pH: 5.5 (ref 5.0–8.0)

## 2014-03-28 LAB — APTT: aPTT: 30 seconds (ref 24–37)

## 2014-03-28 LAB — CBC
HCT: 39.3 % (ref 39.0–52.0)
Hemoglobin: 13.5 g/dL (ref 13.0–17.0)
MCH: 32 pg (ref 26.0–34.0)
MCHC: 34.4 g/dL (ref 30.0–36.0)
MCV: 93.1 fL (ref 78.0–100.0)
Platelets: 227 10*3/uL (ref 150–400)
RBC: 4.22 MIL/uL (ref 4.22–5.81)
RDW: 14.1 % (ref 11.5–15.5)
WBC: 7.6 10*3/uL (ref 4.0–10.5)

## 2014-03-28 LAB — COMPREHENSIVE METABOLIC PANEL
ALT: 50 U/L (ref 0–53)
AST: 52 U/L — ABNORMAL HIGH (ref 0–37)
Albumin: 3.8 g/dL (ref 3.5–5.2)
Alkaline Phosphatase: 66 U/L (ref 39–117)
BUN: 11 mg/dL (ref 6–23)
CO2: 22 mEq/L (ref 19–32)
Calcium: 9.8 mg/dL (ref 8.4–10.5)
Chloride: 94 mEq/L — ABNORMAL LOW (ref 96–112)
Creatinine, Ser: 0.58 mg/dL (ref 0.50–1.35)
GFR calc Af Amer: >90 mL/min (ref >90)
GFR calc non Af Amer: >90 mL/min (ref >90)
Glucose, Bld: 170 mg/dL — ABNORMAL HIGH (ref 70–99)
Potassium: 3.6 mEq/L — ABNORMAL LOW (ref 3.7–5.3)
Sodium: 135 mEq/L — ABNORMAL LOW (ref 137–147)
Total Bilirubin: 0.5 mg/dL (ref 0.3–1.2)
Total Protein: 8 g/dL (ref 6.0–8.3)

## 2014-03-28 LAB — I-STAT TROPONIN, ED: Troponin i, poc: 0 ng/mL (ref 0.00–0.08)

## 2014-03-28 LAB — PROTIME-INR
INR: 0.91 (ref 0.00–1.49)
Prothrombin Time: 12.1 seconds (ref 11.6–15.2)

## 2014-03-28 LAB — ETHANOL: Alcohol, Ethyl (B): <11 mg/dL (ref 0–11)

## 2014-03-28 MED ORDER — GI COCKTAIL ~~LOC~~
30.0000 mL | Freq: Once | ORAL | Status: DC
  Filled 2014-03-28: qty 30

## 2014-03-28 MED ORDER — ONDANSETRON HCL 4 MG/2ML IJ SOLN
4.0000 mg | Freq: Once | INTRAMUSCULAR | Status: AC
  Administered 2014-03-28: 4 mg via INTRAVENOUS
  Filled 2014-03-28: qty 2

## 2014-03-28 MED ORDER — HYDROMORPHONE HCL PF 1 MG/ML IJ SOLN
1.0000 mg | Freq: Once | INTRAMUSCULAR | Status: AC
Start: 2014-03-28 — End: 2014-03-28
  Administered 2014-03-28: 1 mg via INTRAVENOUS
  Filled 2014-03-28: qty 1

## 2014-03-28 NOTE — ED Notes (Signed)
Pt returned from MRI °

## 2014-03-28 NOTE — Progress Notes (Signed)
Orthopedic Tech Progress Note Patient Details:  [name] 21-Aug-1961 [id number]  Ortho Devices Type of Ortho Device: Velcro wrist forearm splint Ortho Device/Splint Interventions: Ordered;Application;Adjustment   [name] 03/28/2014, 7:16 PM

## 2014-03-28 NOTE — ED Notes (Signed)
PT comfortable with discharge and follow up instructions. Pt understands that he is leaving AMA r/t not having MRI. No prescriptions.

## 2014-03-28 NOTE — ED Notes (Signed)
Pt reports that he has weakness in the right hand that started about 2 weeks ago. Reports that he is having a hard time picking things up. Denies any pain. Denies any other neuro deficits, pt A&OX4. No chest pain or SOB.

## 2014-03-28 NOTE — Discharge Instructions (Signed)
Neuropathic Pain We often think that pain has a physical cause. If we get rid of the cause, the pain should go away. Nerves themselves can also cause pain. It is called neuropathic pain, which means nerve abnormality. It may be difficult for the patients who have it and for the treating caregivers. Pain is usually described as acute (short-lived) or chronic (long-lasting). Acute pain is related to the physical sensations caused by an injury. It can last from a few seconds to many weeks, but it usually goes away when normal healing occurs. Chronic pain lasts beyond the typical healing time. With neuropathic pain, the nerve fibers themselves may be damaged or injured. They then send incorrect signals to other pain centers. The pain you feel is real, but the cause is not easy to find.  CAUSES  Chronic pain can result from diseases, such as diabetes and shingles (an infection related to chickenpox), or from trauma, surgery, or amputation. It can also happen without any known injury or disease. The nerves are sending pain messages, even though there is no identifiable cause for such messages.   Other common causes of neuropathy include diabetes, phantom limb pain, or Regional Pain Syndrome (RPS).  As with all forms of chronic back pain, if neuropathy is not correctly treated, there can be a number of associated problems that lead to a downward cycle for the patient. These include depression, sleeplessness, feelings of fear and anxiety, limited social interaction and inability to do normal daily activities or work.  The most dramatic and mysterious example of neuropathic pain is called "phantom limb syndrome." This occurs when an arm or a leg has been removed because of illness or injury. The brain still gets pain messages from the nerves that originally carried impulses from the missing limb. These nerves now seem to misfire and cause troubling pain.  Neuropathic pain often seems to have no cause. It responds  poorly to standard pain treatment. Neuropathic pain can occur after:  Shingles (herpes zoster virus infection).  A lasting burning sensation of the skin, caused usually by injury to a peripheral nerve.  Peripheral neuropathy which is widespread nerve damage, often caused by diabetes or alcoholism.  Phantom limb pain following an amputation.  Facial nerve problems (trigeminal neuralgia).  Multiple sclerosis.  Reflex sympathetic dystrophy.  Pain which comes with cancer and cancer chemotherapy.  Entrapment neuropathy such as when pressure is put on a nerve such as in carpal tunnel syndrome.  Back, leg, and hip problems (sciatica).  Spine or back surgery.  HIV Infection or AIDS where nerves are infected by viruses. Your caregiver can explain items in the above list which may apply to you. SYMPTOMS  Characteristics of neuropathic pain are:  Severe, sharp, electric shock-like, shooting, lightening-like, knife-like.  Pins and needles sensation.  Deep burning, deep cold, or deep ache.  Persistent numbness, tingling, or weakness.  Pain resulting from light touch or other stimulus that would not usually cause pain.  Increased sensitivity to something that would normally cause pain, such as a pinprick. Pain may persist for months or years following the healing of damaged tissues. When this happens, pain signals no longer sound an alarm about current injuries or injuries about to happen. Instead, the alarm system itself is not working correctly.  Neuropathic pain may get worse instead of better over time. For some people, it can lead to serious disability. It is important to be aware that severe injury in a limb can occur without a proper, protective pain  response.Burns, cuts, and other injuries may go unnoticed. Without proper treatment, these injuries can become infected or lead to further disability. Take any injury seriously, and consult your caregiver for treatment. DIAGNOSIS    When you have a pain with no known cause, your caregiver will probably ask some specific questions:   Do you have any other conditions, such as diabetes, shingles, multiple sclerosis, or HIV infection?  How would you describe your pain? (Neuropathic pain is often described as shooting, stabbing, burning, or searing.)  Is your pain worse at any time of the day? (Neuropathic pain is usually worse at night.)  Does the pain seem to follow a certain physical pathway?  Does the pain come from an area that has missing or injured nerves? (An example would be phantom limb pain.)  Is the pain triggered by minor things such as rubbing against the sheets at night? These questions often help define the type of pain involved. Once your caregiver knows what is happening, treatment can begin. Anticonvulsant, antidepressant drugs, and various pain relievers seem to work in some cases. If another condition, such as diabetes is involved, better management of that disorder may relieve the neuropathic pain.  TREATMENT  Neuropathic pain is frequently long-lasting and tends not to respond to treatment with narcotic type pain medication. It may respond well to other drugs such as antiseizure and antidepressant medications. Usually, neuropathic problems do not completely go away, but partial improvement is often possible with proper treatment. Your caregivers have large numbers of medications available to treat you. Do not be discouraged if you do not get immediate relief. Sometimes different medications or a combination of medications will be tried before you receive the results you are hoping for. See your caregiver if you have pain that seems to be coming from nowhere and does not go away. Help is available.  SEEK IMMEDIATE MEDICAL CARE IF:   There is a sudden change in the quality of your pain, especially if the change is on only one side of the body.  You notice changes of the skin, such as redness, black or  purple discoloration, swelling, or an ulcer.  You cannot move the affected limbs. Document Released: 07/07/2004 Document Revised: 01/02/2012 Document Reviewed: 07/07/2004 [facility] Patient Information 2014 [place].  Paresthesia Paresthesia is a burning or prickling feeling. This feeling can happen in any part of the body. It often happens in the hands, arms, legs, or feet. HOME CARE  Avoid drinking alcohol.  Try massage or needle therapy (acupuncture) to help with your problems.  Keep all doctor visits as told. GET HELP RIGHT AWAY IF:   You feel weak.  You have trouble walking or moving.  You have problems speaking or seeing.  You feel confused.  You cannot control when you poop (bowel movement) or pee (urinate).  You lose feeling (numbness) after an injury.  You pass out (faint).  Your burning or prickling feeling gets worse when you walk.  You have pain, cramps, or feel dizzy.  You have a rash. MAKE SURE YOU:   Understand these instructions.  Will watch your condition.  Will get help right away if you are not doing well or get worse. Document Released: 09/22/2008 Document Revised: 01/02/2012 Document Reviewed: 07/01/2011 [facility] Patient Information 2014 [place].  Carpal Tunnel Syndrome The carpal tunnel is an area under the skin of the palm of your hand. Nerves, blood vessels, and strong tissues (tendons) pass through the tunnel. The tunnel can become puffy (swollen).  If this happens, a nerve can be pinched in the wrist. This causes carpal tunnel syndrome.  HOME CARE  Take all medicine as told by your doctor.  If you were given a splint, wear it as told. Wear it at night or at times when your doctor told you to.  Rest your wrist from the activity that causes your pain.  Put ice on your wrist after long periods of wrist activity.  Put ice in a plastic bag.  Place a towel between your skin and the bag.  Leave the ice on for 15-20 minutes,  03-04 times a day.  Keep all doctor visits as told. GET HELP RIGHT AWAY IF:  You have new problems you cannot explain.  Your problems get worse and medicine does not help. MAKE SURE YOU:   Understand these instructions.  Will watch your condition.  Will get help right away if you are not doing well or get worse. Document Released: 09/29/2011 Document Revised: 01/02/2012 Document Reviewed: 09/29/2011 [facility] Patient Information 2014 [place], [place].

## 2014-03-28 NOTE — ED Notes (Signed)
Pt to treatment room at this time.

## 2014-03-28 NOTE — ED Notes (Signed)
Pt states is unable to lay flat on his back for duration of MRI, states even with GI cocktail he will not tolerate MRI. PA made aware.

## 2014-03-28 NOTE — ED Provider Notes (Signed)
CSN: [id number]     Arrival date & time 03/28/14  1029 History   First MD Initiated Contact with Patient 03/28/14 1221     Chief Complaint  Patient presents with  . Numbness     (Consider location/radiation/quality/duration/timing/severity/associated sxs/prior Treatment) HPI Comments: Patient is a 53 year old male with history of diabetes and hypertension who presents today with 2 days of gradually worsening pain and numbness. He states that his first 2 fingers are numb and his fourth finger. It is a tingling sensation. He is having trouble holding on to his fork and having difficulty picking things up. He states that he has an associated headache, but this is not new. He has headaches every morning because "he doesn't sleep well". He has an appointment at the sleep clinic coming up. He denies any blurry vision, double vision, photophobia. He denies any chest pain or shortness of breath. No dizziness or lightheadedness.  The history is provided by the patient. No language interpreter was used.    Past Medical History  Diagnosis Date  . Type 2 diabetes mellitus   . Hypertension   . Peptic ulcer    Past Surgical History  Procedure Laterality Date  . Coronary stent placement     Family History  Problem Relation Age of Onset  . Hypertension Mother   . Diabetes Mother   . Cancer Father   . Hypertension Brother   . Hypertension Sister    History  Substance Use Topics  . Smoking status: Never Smoker   . Smokeless tobacco: Never Used  . Alcohol Use: No    Review of Systems  Constitutional: Negative for fever and chills.  Gastrointestinal: Negative for vomiting and abdominal pain.  Neurological: Positive for numbness and headaches. Negative for dizziness.  All other systems reviewed and are negative.     Allergies  Ibuprofen  Home Medications   Prior to Admission medications   Medication Sig Start Date End Date Taking? Authorizing Provider  amitriptyline (ELAVIL) 25 MG  tablet Take 25 mg by mouth at bedtime.   Yes Historical Provider, MD  cloNIDine (CATAPRES) 0.1 MG tablet Take 1 tablet (0.1 mg total) by mouth 2 (two) times daily. 02/03/14  Yes Pixie Casino, MD  esomeprazole (NEXIUM) 40 MG capsule Take 40 mg by mouth 2 (two) times daily before a meal.   Yes Historical Provider, MD  glipiZIDE (GLUCOTROL) 5 MG tablet Take 7.5 mg by mouth daily before breakfast.    Yes Historical Provider, MD  hydrALAZINE (APRESOLINE) 50 MG tablet Take 50 mg by mouth 2 (two) times daily.    Yes Historical Provider, MD  lisinopril (PRINIVIL,ZESTRIL) 40 MG tablet Take 40 mg by mouth daily.   Yes Historical Provider, MD  metoprolol succinate (TOPROL-XL) 100 MG 24 hr tablet Take 100 mg by mouth daily. Take with or immediately following a meal.   Yes Historical Provider, MD  oxyCODONE-acetaminophen (PERCOCET/ROXICET) 5-325 MG per tablet Take 1 tablet by mouth every 4 (four) hours as needed for severe pain.   Yes Historical Provider, MD  promethazine (PHENERGAN) 25 MG tablet Take 25 mg by mouth every 6 (six) hours as needed for nausea or vomiting.   Yes Historical Provider, MD  sitaGLIPtin (JANUVIA) 100 MG tablet Take 100 mg by mouth daily.   Yes Historical Provider, MD   BP 163/84  Pulse 93  Temp(Src) 98.4 F (36.9 C) (Oral)  Resp 19  Ht 5\' 10"  (1.778 m)  Wt 320 lb (145.151 kg)  BMI 45.92 kg/m2  SpO2 94% Physical Exam  Nursing note and vitals reviewed. Constitutional: He is oriented to person, place, and time. He appears well-developed and well-nourished. He does not appear ill. No distress.  Morbidly obese. NAD  HENT:  Head: Normocephalic and atraumatic.  Right Ear: External ear normal.  Left Ear: External ear normal.  Nose: Nose normal.  No temporal artery tenderness  Eyes: Conjunctivae and EOM are normal. Pupils are equal, round, and reactive to light.  Neck: Normal range of motion. No rigidity. No tracheal deviation present.  No nuchal rigidity or meningeal signs   Cardiovascular: Normal rate, regular rhythm, normal heart sounds, intact distal pulses and normal pulses.   Pulses:      Radial pulses are 2+ on the right side, and 2+ on the left side.       Posterior tibial pulses are 2+ on the right side, and 2+ on the left side.  Pulmonary/Chest: Effort normal and breath sounds normal. No stridor.  Abdominal: Soft. He exhibits no distension. There is no tenderness.  Musculoskeletal: Normal range of motion.  Moves all extremities without guarding or ataxia.  Neurological: He is alert and oriented to person, place, and time. No cranial nerve deficit. Coordination and gait normal. GCS eye subscore is 4. GCS verbal subscore is 5. GCS motor subscore is 6.  Mildly decreased grip strength on right due to pain. Finger nose finger normal. Rapid alternating movements normal. Patient with decreased sensation to fingers 1,2 and the distal tip of 4.   Skin: Skin is warm and dry. He is not diaphoretic.  Psychiatric: He has a normal mood and affect. His behavior is normal.    ED Course  Procedures (including critical care time) Labs Review Labs Reviewed  COMPREHENSIVE METABOLIC PANEL - Abnormal; Notable for the following:    Sodium 135 (*)    Potassium 3.6 (*)    Chloride 94 (*)    Glucose, Bld 170 (*)    AST 52 (*)    All other components within normal limits  URINALYSIS, ROUTINE W REFLEX MICROSCOPIC - Abnormal; Notable for the following:    Glucose, UA >1000 (*)    Ketones, ur 15 (*)    Protein, ur 30 (*)    All other components within normal limits  I-STAT CHEM 8, ED - Abnormal; Notable for the following:    Potassium 3.4 (*)    Glucose, Bld 181 (*)    All other components within normal limits  ETHANOL  PROTIME-INR  APTT  CBC  DIFFERENTIAL  URINE RAPID DRUG SCREEN (HOSP PERFORMED)  URINE MICROSCOPIC-ADD ON  I-STAT TROPOININ, ED  I-STAT TROPOININ, ED    Imaging Review Ct Head Wo Contrast  03/28/2014   CLINICAL DATA:  Right hand numbness  EXAM:  CT HEAD WITHOUT CONTRAST  TECHNIQUE: Contiguous axial images were obtained from the base of the skull through the vertex without intravenous contrast.  COMPARISON:  10/20/2013  FINDINGS: The bony calvarium is intact. The ventricles are of normal size and configuration. No findings to suggest acute hemorrhage, acute infarction or space-occupying mass lesion are noted.  IMPRESSION: No acute intracranial abnormality is noted.   Electronically Signed   By: Inez Catalina M.D.   On: 03/28/2014 13:39     EKG Interpretation   Date/Time:  Friday March 28 2014 12:53:56 EDT Ventricular Rate:  95 PR Interval:  158 QRS Duration: 84 QT Interval:  367 QTC Calculation: 461 R Axis:   78 Text Interpretation:  Sinus rhythm Abnormal R-wave progression,  late  transition Borderline T abnormalities, inferior leads No significant  change since last tracing Confirmed by [facility]  MD, [place] [phone]) on  03/28/2014 4:35:38 PM      MDM   Final diagnoses:  Numbness    Patient presents to ED for evaluation of numbness to his fingers. CT head is unremarkable. Labs are unremarkable. Attempted MRI of patient, but patient refused unless he was completely put to sleep. Meds offered which patient refused. Discussed with patient only way to rule out central cause of this numbness is MRI. Risks of leaving Against Medical Advice were discussed. Patient was given wrist splint as peripheral neuropathy such as carpal tunnel is in the differential. Patient encouraged to return to the ED if he changes his mind. Vital signs are stable at this time. Discussed case with Dr. Canary Brim who agrees with plan. Patient / Family / Caregiver informed of clinical course, understand medical decision-making process, and agree with plan.     Elwyn Lade, PA-C 03/29/14 [phone]

## 2014-03-29 NOTE — ED Provider Notes (Signed)
Medical screening examination/treatment/procedure(s) were performed by non-physician practitioner and as supervising physician I was immediately available for consultation/collaboration.   EKG Interpretation   Date/Time:  Friday March 28 2014 12:53:56 EDT Ventricular Rate:  95 PR Interval:  158 QRS Duration: 84 QT Interval:  367 QTC Calculation: 461 R Axis:   78 Text Interpretation:  Sinus rhythm Abnormal R-wave progression, late  transition Borderline T abnormalities, inferior leads No significant  change since last tracing Confirmed by Canary Brim  MD, Darius Fillingim [phone]) on  03/28/2014 4:35:38 PM       Threasa Beards, MD 03/29/14 [phone]

## 2014-05-27 ENCOUNTER — Emergency Department (HOSPITAL_COMMUNITY): Payer: Self-pay

## 2014-05-27 ENCOUNTER — Encounter (HOSPITAL_COMMUNITY): Payer: Self-pay | Admitting: Emergency Medicine

## 2014-05-27 ENCOUNTER — Inpatient Hospital Stay (HOSPITAL_COMMUNITY)
Admission: EM | Admit: 2014-05-27 | Discharge: 2014-06-01 | DRG: 442 | Disposition: A | Discharge status: Home / Self Care | Payer: No Typology Code available for payment source | Attending: Internal Medicine | Admitting: Internal Medicine

## 2014-05-27 DIAGNOSIS — E872 Acidosis, unspecified: Secondary | ICD-10-CM | POA: Diagnosis present

## 2014-05-27 DIAGNOSIS — R0789 Other chest pain: Secondary | ICD-10-CM | POA: Diagnosis present

## 2014-05-27 DIAGNOSIS — E1142 Type 2 diabetes mellitus with diabetic polyneuropathy: Secondary | ICD-10-CM | POA: Diagnosis present

## 2014-05-27 DIAGNOSIS — I10 Essential (primary) hypertension: Secondary | ICD-10-CM | POA: Diagnosis present

## 2014-05-27 DIAGNOSIS — E1365 Other specified diabetes mellitus with hyperglycemia: Secondary | ICD-10-CM | POA: Diagnosis present

## 2014-05-27 DIAGNOSIS — Z8711 Personal history of peptic ulcer disease: Secondary | ICD-10-CM

## 2014-05-27 DIAGNOSIS — E1149 Type 2 diabetes mellitus with other diabetic neurological complication: Secondary | ICD-10-CM | POA: Diagnosis present

## 2014-05-27 DIAGNOSIS — K7689 Other specified diseases of liver: Principal | ICD-10-CM | POA: Diagnosis present

## 2014-05-27 DIAGNOSIS — E119 Type 2 diabetes mellitus without complications: Secondary | ICD-10-CM | POA: Diagnosis present

## 2014-05-27 DIAGNOSIS — R109 Unspecified abdominal pain: Secondary | ICD-10-CM | POA: Diagnosis present

## 2014-05-27 DIAGNOSIS — Z6841 Body Mass Index (BMI) 40.0 and over, adult: Secondary | ICD-10-CM

## 2014-05-27 DIAGNOSIS — R1011 Right upper quadrant pain: Secondary | ICD-10-CM

## 2014-05-27 DIAGNOSIS — E860 Dehydration: Secondary | ICD-10-CM | POA: Diagnosis present

## 2014-05-27 DIAGNOSIS — E785 Hyperlipidemia, unspecified: Secondary | ICD-10-CM | POA: Diagnosis present

## 2014-05-27 DIAGNOSIS — R079 Chest pain, unspecified: Secondary | ICD-10-CM

## 2014-05-27 DIAGNOSIS — E1342 Other specified diabetes mellitus with diabetic polyneuropathy: Secondary | ICD-10-CM | POA: Diagnosis present

## 2014-05-27 DIAGNOSIS — L980 Pyogenic granuloma: Secondary | ICD-10-CM | POA: Diagnosis present

## 2014-05-27 DIAGNOSIS — IMO0002 Reserved for concepts with insufficient information to code with codable children: Secondary | ICD-10-CM

## 2014-05-27 DIAGNOSIS — Z79899 Other long term (current) drug therapy: Secondary | ICD-10-CM

## 2014-05-27 HISTORY — DX: Dorsalgia, unspecified: M54.9

## 2014-05-27 LAB — CBC
HCT: 36.1 % — ABNORMAL LOW (ref 39.0–52.0)
Hemoglobin: 12.6 g/dL — ABNORMAL LOW (ref 13.0–17.0)
MCH: 32.4 pg (ref 26.0–34.0)
MCHC: 34.9 g/dL (ref 30.0–36.0)
MCV: 92.8 fL (ref 78.0–100.0)
Platelets: 230 10*3/uL (ref 150–400)
RBC: 3.89 MIL/uL — ABNORMAL LOW (ref 4.22–5.81)
RDW: 13.4 % (ref 11.5–15.5)
WBC: 8.1 10*3/uL (ref 4.0–10.5)

## 2014-05-27 LAB — HEPATIC FUNCTION PANEL
ALT: 37 U/L (ref 0–53)
AST: 45 U/L — ABNORMAL HIGH (ref 0–37)
Albumin: 3.7 g/dL (ref 3.5–5.2)
Alkaline Phosphatase: 67 U/L (ref 39–117)
Bilirubin, Direct: <0.2 mg/dL (ref 0.0–0.3)
Total Bilirubin: 0.3 mg/dL (ref 0.3–1.2)
Total Protein: 7.6 g/dL (ref 6.0–8.3)

## 2014-05-27 LAB — URINE MICROSCOPIC-ADD ON

## 2014-05-27 LAB — URINALYSIS, ROUTINE W REFLEX MICROSCOPIC
Bilirubin Urine: NEGATIVE
Glucose, UA: >1000 mg/dL — AB
Hgb urine dipstick: NEGATIVE
Ketones, ur: 15 mg/dL — AB
Leukocytes, UA: NEGATIVE
Nitrite: NEGATIVE
Protein, ur: NEGATIVE mg/dL
Specific Gravity, Urine: 1.023 (ref 1.005–1.030)
Urobilinogen, UA: 0.2 mg/dL (ref 0.0–1.0)
pH: 5.5 (ref 5.0–8.0)

## 2014-05-27 LAB — BASIC METABOLIC PANEL
Anion gap: 21 — ABNORMAL HIGH (ref 5–15)
BUN: 9 mg/dL (ref 6–23)
CO2: 22 mEq/L (ref 19–32)
Calcium: 9.2 mg/dL (ref 8.4–10.5)
Chloride: 95 mEq/L — ABNORMAL LOW (ref 96–112)
Creatinine, Ser: 0.55 mg/dL (ref 0.50–1.35)
GFR calc Af Amer: >90 mL/min (ref >90)
GFR calc non Af Amer: >90 mL/min (ref >90)
Glucose, Bld: 219 mg/dL — ABNORMAL HIGH (ref 70–99)
Potassium: 3.4 mEq/L — ABNORMAL LOW (ref 3.7–5.3)
Sodium: 138 mEq/L (ref 137–147)

## 2014-05-27 LAB — I-STAT TROPONIN, ED: Troponin i, poc: 0 ng/mL (ref 0.00–0.08)

## 2014-05-27 LAB — I-STAT CG4 LACTIC ACID, ED: Lactic Acid, Venous: 4.15 mmol/L — ABNORMAL HIGH (ref 0.5–2.2)

## 2014-05-27 LAB — CBG MONITORING, ED: Glucose-Capillary: 276 mg/dL — ABNORMAL HIGH (ref 70–99)

## 2014-05-27 LAB — LIPASE, BLOOD: Lipase: 24 U/L (ref 11–59)

## 2014-05-27 MED ORDER — SODIUM CHLORIDE 0.9 % IV BOLUS (SEPSIS)
500.0000 mL | Freq: Once | INTRAVENOUS | Status: AC
  Administered 2014-05-28: 500 mL via INTRAVENOUS

## 2014-05-27 MED ORDER — MORPHINE SULFATE 4 MG/ML IJ SOLN
4.0000 mg | Freq: Once | INTRAMUSCULAR | Status: AC
  Administered 2014-05-27: 4 mg via INTRAVENOUS
  Filled 2014-05-27: qty 1

## 2014-05-27 MED ORDER — SODIUM CHLORIDE 0.9 % IV BOLUS (SEPSIS)
1000.0000 mL | Freq: Once | INTRAVENOUS | Status: AC
  Administered 2014-05-27: 1000 mL via INTRAVENOUS

## 2014-05-27 NOTE — ED Notes (Signed)
Pt presents with pain to right side of chest that wraps around to his back, worse with inspiration.  Pt reports he begins to feel short of breath and then the pain begins.  Denies recent cough.  Denies N/V/D.  Lung sounds clear.

## 2014-05-27 NOTE — ED Notes (Signed)
Pt ambulates without distress.  

## 2014-05-27 NOTE — ED Notes (Signed)
Lactic Acid results called to Dr.Nanavati

## 2014-05-27 NOTE — ED Notes (Signed)
cbg done in triage.

## 2014-05-27 NOTE — ED Provider Notes (Signed)
CSN: [id number]     Arrival date & time 05/27/14  1601 History   First MD Initiated Contact with Patient 05/27/14 2012     Chief Complaint  Patient presents with  . Shortness of Breath     (Consider location/radiation/quality/duration/timing/severity/associated sxs/prior Treatment) Patient is a 53 y.o. male presenting with shortness of breath and abdominal pain. The history is provided by the patient.  Shortness of Breath Associated symptoms: abdominal pain   Associated symptoms: no chest pain, no fever, no headaches, no rash and no vomiting   Abdominal Pain Pain location:  R flank Pain quality: sharp and shooting   Pain radiates to:  Does not radiate Pain severity:  Severe Onset quality:  Sudden Duration: 20 min. Timing:  Constant Progression:  Unchanged Chronicity:  New Relieved by:  Nothing Worsened by:  Nothing tried Ineffective treatments:  None tried Associated symptoms: shortness of breath   Associated symptoms: no chest pain, no chills, no diarrhea, no fever, no nausea and no vomiting    53 yo M with the chief complaint of right-sided flank pain. Patient states this has been going on for about 4 days now the pain comes on suddenly a sharp stabbing lasts for about 20 minutes and then resolves. Patient thinks it may be associated with food. Patient denies any worsening with palpation denies worsening with rotation.  Never had pain like this before.  Denies fever, chills.    Past Medical History  Diagnosis Date  . Type 2 diabetes mellitus   . Hypertension   . Peptic ulcer   . Back pain    Past Surgical History  Procedure Laterality Date  . Coronary stent placement     Family History  Problem Relation Age of Onset  . Hypertension Mother   . Diabetes Mother   . Cancer Father   . Hypertension Brother   . Hypertension Sister    History  Substance Use Topics  . Smoking status: Never Smoker   . Smokeless tobacco: Never Used  . Alcohol Use: No    Review of  Systems  Constitutional: Negative for fever and chills.  HENT: Negative for congestion and facial swelling.   Eyes: Negative for discharge and visual disturbance.  Respiratory: Positive for shortness of breath.   Cardiovascular: Negative for chest pain and palpitations.  Gastrointestinal: Positive for abdominal pain. Negative for nausea, vomiting and diarrhea.  Genitourinary: Positive for flank pain (R sided).  Musculoskeletal: Negative for arthralgias and myalgias.  Skin: Negative for color change and rash.  Neurological: Negative for tremors, syncope and headaches.  Psychiatric/Behavioral: Negative for confusion and dysphoric mood.      Allergies  Ibuprofen  Home Medications   Prior to Admission medications   Medication Sig Start Date End Date Taking? Authorizing Provider  amitriptyline (ELAVIL) 25 MG tablet Take 25 mg by mouth at bedtime.   Yes Historical Provider, MD  cloNIDine (CATAPRES) 0.1 MG tablet Take 0.1 mg by mouth 2 (two) times daily.   Yes Historical Provider, MD  esomeprazole (NEXIUM) 40 MG capsule Take 40 mg by mouth 2 (two) times daily before a meal.   Yes Historical Provider, MD  glipiZIDE (GLUCOTROL) 5 MG tablet Take 7.5 mg by mouth daily before breakfast.    Yes Historical Provider, MD  hydrALAZINE (APRESOLINE) 50 MG tablet Take 50 mg by mouth 2 (two) times daily.    Yes Historical Provider, MD  lisinopril (PRINIVIL,ZESTRIL) 40 MG tablet Take 40 mg by mouth daily.   Yes Historical Provider, MD  metoprolol succinate (TOPROL-XL) 100 MG 24 hr tablet Take 100 mg by mouth daily. Take with or immediately following a meal.   Yes Historical Provider, MD  oxyCODONE-acetaminophen (PERCOCET/ROXICET) 5-325 MG per tablet Take 1 tablet by mouth every 4 (four) hours as needed for severe pain.   Yes Historical Provider, MD  promethazine (PHENERGAN) 25 MG tablet Take 25 mg by mouth every 6 (six) hours as needed for nausea or vomiting.   Yes Historical Provider, MD  sitaGLIPtin  (JANUVIA) 100 MG tablet Take 100 mg by mouth daily.   Yes Historical Provider, MD   BP 165/91  Pulse 97  Temp(Src) 98.1 F (36.7 C) (Oral)  Resp 24  Wt 322 lb 8 oz (146.285 kg)  SpO2 97% Physical Exam  Constitutional: He is oriented to person, place, and time. He appears well-developed and well-nourished.  HENT:  Head: Normocephalic and atraumatic.  Eyes: EOM are normal. Pupils are equal, round, and reactive to light.  Neck: Normal range of motion. Neck supple. No JVD present.  Cardiovascular: Normal rate and regular rhythm.  Exam reveals no gallop and no friction rub.   No murmur heard. Pulmonary/Chest: No respiratory distress. He has no wheezes.  Abdominal: He exhibits no distension. There is tenderness (RUQ). There is guarding. There is no rebound.  Musculoskeletal: Normal range of motion.  Neurological: He is alert and oriented to person, place, and time.  Skin: No rash noted. No pallor.  Psychiatric: He has a normal mood and affect. His behavior is normal.    ED Course  Procedures (including critical care time) Labs Review Labs Reviewed  CBC - Abnormal; Notable for the following:    RBC 3.89 (*)    Hemoglobin 12.6 (*)    HCT 36.1 (*)    All other components within normal limits  BASIC METABOLIC PANEL - Abnormal; Notable for the following:    Potassium 3.4 (*)    Chloride 95 (*)    Glucose, Bld 219 (*)    Anion gap 21 (*)    All other components within normal limits  HEPATIC FUNCTION PANEL - Abnormal; Notable for the following:    AST 45 (*)    All other components within normal limits  URINALYSIS, ROUTINE W REFLEX MICROSCOPIC - Abnormal; Notable for the following:    APPearance CLOUDY (*)    Glucose, UA >1000 (*)    Ketones, ur 15 (*)    All other components within normal limits  CBG MONITORING, ED - Abnormal; Notable for the following:    Glucose-Capillary 276 (*)    All other components within normal limits  I-STAT CG4 LACTIC ACID, ED - Abnormal; Notable for  the following:    Lactic Acid, Venous 4.15 (*)    All other components within normal limits  LIPASE, BLOOD  URINE MICROSCOPIC-ADD ON  LACTIC ACID, PLASMA  I-STAT TROPOININ, ED    Imaging Review Dg Chest 2 View (if Patient Has Fever And/or Copd)  05/27/2014   CLINICAL DATA:  Shortness of breath, chest pain for 1 week  EXAM: CHEST  2 VIEW  COMPARISON:  Chest x-ray of 03/14/2014  FINDINGS: No focal infiltrate or effusion is seen. Mediastinal hilar contours are stable as is cardiomegaly. There are degenerative changes throughout the thoracic spine.  IMPRESSION: No definite active process.  Stable cardiomegaly   Electronically Signed   By: Ivar Drape M.D.   On: 05/27/2014 16:59   US Abdomen Limited Ruq  05/27/2014   CLINICAL DATA:  Right upper quadrant  pain.  EXAM: US ABDOMEN LIMITED - RIGHT UPPER QUADRANT  COMPARISON:  None.  FINDINGS: Gallbladder:  No gallstones or wall thickening visualized. No sonographic Murphy sign noted.  Common bile duct:  Diameter: 5 mm  Liver:  No focal lesion.  Increased in echogenicity.  IMPRESSION: Hepatic steatosis.  No cholelithiasis or sonographic evidence for acute cholecystitis.   Electronically Signed   By: Lovey Newcomer M.D.   On: 05/27/2014 21:23     EKG Interpretation   Date/Time:  Tuesday May 27 2014 16:27:34 EDT Ventricular Rate:  97 PR Interval:  148 QRS Duration: 82 QT Interval:  378 QTC Calculation: 480 R Axis:   30 Text Interpretation:  Normal sinus rhythm Inferior infarct , age  undetermined Anterior infarct , age undetermined Abnormal ECG No acute  changes Confirmed by Kathrynn Humble, MD, Thelma Comp [phone]) on 05/27/2014 9:41:46 PM      MDM   Final diagnoses:  None    53 yo M with R flank pain.  Exam with no CVA TTP, but with RUQ TTP patient feels like recreates his pain.  No hx of nephrolithiasis.   Patient with elevated anion gap. Obtain i-STAT lactate found to have a lactic acidosis of 4.15. Concern for mesenteric ischemia at this point. We'll  obtain a CT angiogram of the abdomen and pelvis.  Patient care turned over to Dr. Randal Buba, please see her note for further care.     Deno Etienne, MD 05/28/14 [phone]

## 2014-05-28 ENCOUNTER — Emergency Department (HOSPITAL_COMMUNITY): Payer: No Typology Code available for payment source

## 2014-05-28 ENCOUNTER — Encounter (HOSPITAL_COMMUNITY): Payer: Self-pay | Admitting: *Deleted

## 2014-05-28 DIAGNOSIS — R079 Chest pain, unspecified: Secondary | ICD-10-CM

## 2014-05-28 DIAGNOSIS — R109 Unspecified abdominal pain: Secondary | ICD-10-CM | POA: Diagnosis present

## 2014-05-28 DIAGNOSIS — E872 Acidosis, unspecified: Secondary | ICD-10-CM | POA: Diagnosis present

## 2014-05-28 DIAGNOSIS — R1011 Right upper quadrant pain: Secondary | ICD-10-CM

## 2014-05-28 DIAGNOSIS — E1349 Other specified diabetes mellitus with other diabetic neurological complication: Secondary | ICD-10-CM

## 2014-05-28 DIAGNOSIS — E785 Hyperlipidemia, unspecified: Secondary | ICD-10-CM

## 2014-05-28 DIAGNOSIS — I10 Essential (primary) hypertension: Secondary | ICD-10-CM

## 2014-05-28 DIAGNOSIS — E1365 Other specified diabetes mellitus with hyperglycemia: Secondary | ICD-10-CM

## 2014-05-28 DIAGNOSIS — E1142 Type 2 diabetes mellitus with diabetic polyneuropathy: Secondary | ICD-10-CM

## 2014-05-28 LAB — CBC
HCT: 37.4 % — ABNORMAL LOW (ref 39.0–52.0)
Hemoglobin: 12.4 g/dL — ABNORMAL LOW (ref 13.0–17.0)
MCH: 31.7 pg (ref 26.0–34.0)
MCHC: 33.2 g/dL (ref 30.0–36.0)
MCV: 95.7 fL (ref 78.0–100.0)
Platelets: 186 10*3/uL (ref 150–400)
RBC: 3.91 MIL/uL — ABNORMAL LOW (ref 4.22–5.81)
RDW: 13.7 % (ref 11.5–15.5)
WBC: 7.1 10*3/uL (ref 4.0–10.5)

## 2014-05-28 LAB — BASIC METABOLIC PANEL
Anion gap: 19 — ABNORMAL HIGH (ref 5–15)
Anion gap: 18 — ABNORMAL HIGH (ref 5–15)
BUN: 8 mg/dL (ref 6–23)
BUN: 8 mg/dL (ref 6–23)
CO2: 24 mEq/L (ref 19–32)
CO2: 24 mEq/L (ref 19–32)
Calcium: 8.8 mg/dL (ref 8.4–10.5)
Calcium: 8.5 mg/dL (ref 8.4–10.5)
Chloride: 98 mEq/L (ref 96–112)
Chloride: 98 mEq/L (ref 96–112)
Creatinine, Ser: 0.55 mg/dL (ref 0.50–1.35)
Creatinine, Ser: 0.56 mg/dL (ref 0.50–1.35)
GFR calc Af Amer: >90 mL/min (ref >90)
GFR calc Af Amer: >90 mL/min (ref >90)
GFR calc non Af Amer: >90 mL/min (ref >90)
GFR calc non Af Amer: >90 mL/min (ref >90)
Glucose, Bld: 171 mg/dL — ABNORMAL HIGH (ref 70–99)
Glucose, Bld: 197 mg/dL — ABNORMAL HIGH (ref 70–99)
Potassium: 3.4 mEq/L — ABNORMAL LOW (ref 3.7–5.3)
Potassium: 3.2 mEq/L — ABNORMAL LOW (ref 3.7–5.3)
Sodium: 141 mEq/L (ref 137–147)
Sodium: 140 mEq/L (ref 137–147)

## 2014-05-28 LAB — HEMOGLOBIN A1C
Hgb A1c MFr Bld: 9.2 % — ABNORMAL HIGH (ref <5.7)
Mean Plasma Glucose: 217 mg/dL — ABNORMAL HIGH (ref <117)

## 2014-05-28 LAB — HEPATIC FUNCTION PANEL
ALT: 39 U/L (ref 0–53)
AST: 40 U/L — ABNORMAL HIGH (ref 0–37)
Albumin: 3.7 g/dL (ref 3.5–5.2)
Alkaline Phosphatase: 56 U/L (ref 39–117)
Bilirubin, Direct: <0.2 mg/dL (ref 0.0–0.3)
Total Bilirubin: 0.4 mg/dL (ref 0.3–1.2)
Total Protein: 7.3 g/dL (ref 6.0–8.3)

## 2014-05-28 LAB — LACTIC ACID, PLASMA: Lactic Acid, Venous: 3.1 mmol/L — ABNORMAL HIGH (ref 0.5–2.2)

## 2014-05-28 LAB — GLUCOSE, CAPILLARY
Glucose-Capillary: 212 mg/dL — ABNORMAL HIGH (ref 70–99)
Glucose-Capillary: 237 mg/dL — ABNORMAL HIGH (ref 70–99)
Glucose-Capillary: 231 mg/dL — ABNORMAL HIGH (ref 70–99)
Glucose-Capillary: 216 mg/dL — ABNORMAL HIGH (ref 70–99)

## 2014-05-28 MED ORDER — PANTOPRAZOLE SODIUM 40 MG PO TBEC
40.0000 mg | DELAYED_RELEASE_TABLET | Freq: Every day | ORAL | Status: DC
  Administered 2014-05-28 – 2014-06-01 (×5): 40 mg via ORAL
  Filled 2014-05-28 (×5): qty 1

## 2014-05-28 MED ORDER — ACETAMINOPHEN 325 MG PO TABS: 650.0000 mg | ORAL_TABLET | Freq: Four times a day (QID) | ORAL | Status: DC | PRN

## 2014-05-28 MED ORDER — ENOXAPARIN SODIUM 80 MG/0.8ML ~~LOC~~ SOLN
70.0000 mg | SUBCUTANEOUS | Status: DC
  Filled 2014-05-28 (×4): qty 0.8

## 2014-05-28 MED ORDER — IOHEXOL 350 MG/ML SOLN
100.0000 mL | Freq: Once | INTRAVENOUS | Status: AC | PRN
  Administered 2014-05-28: 100 mL via INTRAVENOUS

## 2014-05-28 MED ORDER — INSULIN ASPART 100 UNIT/ML ~~LOC~~ SOLN
0.0000 [IU] | Freq: Three times a day (TID) | SUBCUTANEOUS | Status: DC
  Administered 2014-05-29: 7 [IU] via SUBCUTANEOUS
  Administered 2014-05-29: 4 [IU] via SUBCUTANEOUS
  Administered 2014-05-29: 7 [IU] via SUBCUTANEOUS
  Administered 2014-05-30: 11 [IU] via SUBCUTANEOUS
  Administered 2014-05-30 – 2014-05-31 (×2): 7 [IU] via SUBCUTANEOUS
  Administered 2014-05-31: 11 [IU] via SUBCUTANEOUS
  Administered 2014-05-31: 7 [IU] via SUBCUTANEOUS
  Administered 2014-06-01 (×2): 4 [IU] via SUBCUTANEOUS

## 2014-05-28 MED ORDER — HYDRALAZINE HCL 20 MG/ML IJ SOLN
10.0000 mg | Freq: Four times a day (QID) | INTRAMUSCULAR | Status: DC | PRN
  Administered 2014-05-28 – 2014-05-29 (×3): 10 mg via INTRAVENOUS
  Filled 2014-05-28 (×3): qty 1

## 2014-05-28 MED ORDER — ENOXAPARIN SODIUM 30 MG/0.3ML ~~LOC~~ SOLN
30.0000 mg | SUBCUTANEOUS | Status: DC
  Administered 2014-05-28: 30 mg via SUBCUTANEOUS
  Filled 2014-05-28: qty 0.3

## 2014-05-28 MED ORDER — HYDROMORPHONE HCL PF 1 MG/ML IJ SOLN
0.5000 mg | INTRAMUSCULAR | Status: DC | PRN
  Administered 2014-05-28 – 2014-06-01 (×18): 1 mg via INTRAVENOUS
  Filled 2014-05-28 (×21): qty 1

## 2014-05-28 MED ORDER — HYDRALAZINE HCL 50 MG PO TABS
50.0000 mg | ORAL_TABLET | Freq: Two times a day (BID) | ORAL | Status: DC
  Administered 2014-05-28 – 2014-05-29 (×4): 50 mg via ORAL
  Filled 2014-05-28 (×5): qty 1

## 2014-05-28 MED ORDER — CLONIDINE HCL 0.1 MG PO TABS
0.1000 mg | ORAL_TABLET | Freq: Two times a day (BID) | ORAL | Status: DC
  Administered 2014-05-28 – 2014-06-01 (×10): 0.1 mg via ORAL
  Filled 2014-05-28 (×12): qty 1

## 2014-05-28 MED ORDER — INSULIN ASPART 100 UNIT/ML ~~LOC~~ SOLN
0.0000 [IU] | Freq: Three times a day (TID) | SUBCUTANEOUS | Status: DC
  Administered 2014-05-28 (×3): 3 [IU] via SUBCUTANEOUS

## 2014-05-28 MED ORDER — ENOXAPARIN SODIUM 40 MG/0.4ML ~~LOC~~ SOLN
40.0000 mg | SUBCUTANEOUS | Status: AC
  Administered 2014-05-28: 40 mg via SUBCUTANEOUS
  Filled 2014-05-28: qty 0.4

## 2014-05-28 MED ORDER — SODIUM CHLORIDE 0.9 % IV SOLN
INTRAVENOUS | Status: DC
Start: 2014-05-28 — End: 2014-05-29
  Administered 2014-05-28 – 2014-05-29 (×3): via INTRAVENOUS

## 2014-05-28 MED ORDER — POTASSIUM CHLORIDE CRYS ER 20 MEQ PO TBCR
40.0000 meq | EXTENDED_RELEASE_TABLET | Freq: Two times a day (BID) | ORAL | Status: AC
  Administered 2014-05-28 (×2): 40 meq via ORAL
  Filled 2014-05-28 (×2): qty 2

## 2014-05-28 MED ORDER — OXYCODONE HCL 5 MG PO TABS
5.0000 mg | ORAL_TABLET | ORAL | Status: DC | PRN
Start: 2014-05-28 — End: 2014-06-01
  Administered 2014-05-28 – 2014-06-01 (×3): 5 mg via ORAL
  Filled 2014-05-28 (×3): qty 1

## 2014-05-28 MED ORDER — ALUM & MAG HYDROXIDE-SIMETH 200-200-20 MG/5ML PO SUSP: 30.0000 mL | Freq: Four times a day (QID) | ORAL | Status: DC | PRN

## 2014-05-28 MED ORDER — METOPROLOL SUCCINATE ER 100 MG PO TB24
100.0000 mg | ORAL_TABLET | Freq: Every day | ORAL | Status: DC
  Administered 2014-05-28 – 2014-06-01 (×5): 100 mg via ORAL
  Filled 2014-05-28 (×5): qty 1

## 2014-05-28 MED ORDER — AMITRIPTYLINE HCL 25 MG PO TABS
25.0000 mg | ORAL_TABLET | Freq: Every day | ORAL | Status: DC
  Administered 2014-05-28 – 2014-05-31 (×4): 25 mg via ORAL
  Filled 2014-05-28 (×5): qty 1

## 2014-05-28 MED ORDER — ONDANSETRON HCL 4 MG PO TABS
4.0000 mg | ORAL_TABLET | Freq: Four times a day (QID) | ORAL | Status: DC | PRN
  Administered 2014-05-28 – 2014-05-29 (×2): 4 mg via ORAL
  Filled 2014-05-28 (×2): qty 1

## 2014-05-28 MED ORDER — INSULIN ASPART 100 UNIT/ML ~~LOC~~ SOLN
0.0000 [IU] | Freq: Every day | SUBCUTANEOUS | Status: DC
  Administered 2014-05-28 – 2014-05-30 (×3): 2 [IU] via SUBCUTANEOUS

## 2014-05-28 MED ORDER — ACETAMINOPHEN 650 MG RE SUPP: 650.0000 mg | Freq: Four times a day (QID) | RECTAL | Status: DC | PRN

## 2014-05-28 MED ORDER — ONDANSETRON HCL 4 MG/2ML IJ SOLN: 4.0000 mg | Freq: Four times a day (QID) | INTRAMUSCULAR | Status: DC | PRN

## 2014-05-28 NOTE — Plan of Care (Signed)
Problem: Problem: Pain Management Progression Goal: PAIN MANAGEMENT ALTERNATIVE EFFECTIVE Outcome: Progressing Discussed pain/management.

## 2014-05-28 NOTE — Progress Notes (Signed)
Inpatient Diabetes Program Recommendations  AACE/ADA: New Consensus Statement on Inpatient Glycemic Control (2013)  Target Ranges:  Prepandial:   less than 140 mg/dL      Peak postprandial:   less than 180 mg/dL (1-2 hours)      Critically ill patients:  140 - 180 mg/dL   Results for [name], [name] (MRN [id number]) as of 05/28/2014 13:43  Ref. Range 05/27/2014 16:33 05/28/2014 07:48 05/28/2014 11:58  Glucose-Capillary Latest Range: 70-99 mg/dL 276 (H) 212 (H) 237 (H)   Diabetes history: DM2 Outpatient Diabetes medications: Glipizide 7.5 mg QAM, Januvia 100 mg daily Current orders for Inpatient glycemic control: Novolog 0-9 units AC, Novolog 0-5 units HS  Inpatient Diabetes Program Recommendations Correction (SSI): Please consider increasing Novolog correction to resistant scale.  Thanks, Barnie Alderman, RN, MSN, CCRN Diabetes Coordinator Inpatient Diabetes Program [phone] (Team Pager) [phone] ([facility] office) [phone] [facility] office)

## 2014-05-28 NOTE — ED Provider Notes (Signed)
I saw and evaluated the patient, reviewed the resident's note and I agree with the findings and plan.   EKG Interpretation   Date/Time:  Tuesday May 27 2014 16:27:34 EDT Ventricular Rate:  97 PR Interval:  148 QRS Duration: 82 QT Interval:  378 QTC Calculation: 480 R Axis:   30 Text Interpretation:  Normal sinus rhythm Inferior infarct , age  undetermined Anterior infarct , age undetermined Abnormal ECG No acute  changes Confirmed by Kathrynn Humble, MD, Thelma Comp [phone]) on 05/27/2014 9:41:46 PM      Pt with abd pain. Atypical. RUQ, with abd distention. Lactate is elevated, AG is 19, CBG is not terribly high. Pt has vascular dz and DM - pain post prandial - so ischemia considered, but CT is neg. Pt's lactate not clearing, so will admit. Repeat BMP ordered at the time of admission.  Varney Biles, MD 05/28/14 [phone]

## 2014-05-28 NOTE — Progress Notes (Signed)
ANTICOAGULATION CONSULT NOTE - Initial Consult  Pharmacy Consult for lovenox Indication: VTE prophylaxis  Allergies  Allergen Reactions  . Ibuprofen     Made gastric ulcers worse    Patient Measurements: Height: 5\' 11"  (180.3 cm) Weight: 320 lb 11.2 oz (145.469 kg) IBW/kg (Calculated) : 75.3   Vital Signs: Temp: 97.4 F (36.3 C) (08/05 1033) Temp src: Oral (08/05 1033) BP: 146/86 mmHg (08/05 1033) Pulse Rate: 89 (08/05 1033)  Labs:  Recent Labs  05/27/14 2000 05/28/14 0034 05/28/14 0619  HGB 12.6*  --  12.4*  HCT 36.1*  --  37.4*  PLT 230  --  186  CREATININE 0.55 0.55 0.56    Estimated Creatinine Clearance: 161.6 ml/min (by C-G formula based on Cr of 0.56).   Medical History: Past Medical History  Diagnosis Date  . Type 2 diabetes mellitus   . Hypertension   . Peptic ulcer   . Back pain    Assessment: Patient's a 53 y.o M admitted for CP.  Lovenox 30mg  daily ordered for VTE prophylaxis. Weight 145 kg, height 71 inches-- BMI>30   Goal of Therapy:  Anti-XA level 0.3-0.6 Monitor platelets by anticoagulation protocol: Yes   Plan:  1) with high BMI, will change lovenox dose to 70mg  SQ q24h 2) pharmacy will sign off for now  Maryalice Pasley P 05/28/2014,1:52 PM

## 2014-05-28 NOTE — Progress Notes (Signed)
[name] is a 53 y.o. male with a history of DM2, HTN, and PUD who presents to the ED with complaints of SOB, RUQ ABD Pain that radiates into his back that had sudden onset and lasted 20 -30 minutes tonight. He as found to have hepatic steatosis and elevated lactic acid levels. He was admitted to medical service for further recommendations.  His pain has resolved.  Continue to monitor.   Hosie Poisson, MD  [phone]

## 2014-05-28 NOTE — H&P (Signed)
[facility] Admission History and Physical       [name] DQQ:[id number] DOB: 09/06/1961 DOA: 05/27/2014  Referring physician:  PCP: [name]  Specialists:   Chief Complaint: SOB and Chest and Back Pain  HPI: [name] is a 53 y.o. male with a history of DM2, HTN, and PUD who presents to the ED with complaints of SOB, RUQ ABD Pain that radiates into his back that had sudden onset and lasted 20 -30 minutes tonight.    He denies having any nausea or vomiting or diarrhea or fevers or chills.    He was evaluated in the ED and had a Chest X-ray , US of the ABD and CT scan of the ABD performed which did not reveal acute findings.   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   His labs revealed a Lactic Acid Acidosis of 4.15 initially which decreased after IV Fluids were given to 3.1.   He was referred for medical admission.      Review of Systems:  Constitutional: No Weight Loss, No Weight Gain, Night Sweats, Fevers, Chills, Dizziness, Fatigue, or Generalized Weakness HEENT: No Headaches, Difficulty Swallowing,Tooth/Dental Problems,Sore Throat,  No Sneezing, Rhinitis, Ear Ache, Nasal Congestion, or Post Nasal Drip,  Cardio-vascular:  +Chest pain, Orthopnea, PND, Edema in Lower Extremities, Anasarca, Dizziness, Palpitations  Resp: +Dyspnea, No DOE, No Productive Cough, No Non-Productive Cough, No Hemoptysis, No Wheezing.    GI: No Heartburn, Indigestion, +Abdominal Pain, Nausea, Vomiting, Diarrhea, Hematemesis, Hematochezia, Melena, Change in Bowel Habits,  Loss of Appetite  GU: No Dysuria, Change in Color of Urine, No Urgency or Frequency, No Flank pain.  Musculoskeletal: No Joint Pain or Swelling, No Decreased Range of Motion, +Back Pain.  Neurologic: No Syncope, No Seizures, Muscle Weakness, Paresthesia, Vision Disturbance or Loss, No Diplopia, No Vertigo, No Difficulty Walking,  Skin: No Rash or Lesions. Psych:  No Change in Mood or Affect, No Depression or Anxiety, No Memory loss, No Confusion, or Hallucinations   Past Medical History  Diagnosis Date  . Type 2 diabetes mellitus   . Hypertension   . Peptic ulcer   . Back pain     Past Surgical History  Procedure Laterality Date  . Coronary stent placement       Prior to Admission medications   Medication Sig Start Date End Date Taking? Authorizing Provider  amitriptyline (ELAVIL) 25 MG tablet Take 25 mg by mouth at bedtime.   Yes Historical Provider, MD  cloNIDine (CATAPRES) 0.1 MG tablet Take 0.1 mg by mouth 2 (two) times daily.   Yes Historical Provider, MD  esomeprazole (NEXIUM) 40 MG capsule Take 40 mg by mouth 2 (two) times daily before a meal.   Yes Historical Provider, MD  glipiZIDE (GLUCOTROL) 5 MG tablet Take 7.5 mg by mouth daily before breakfast.    Yes Historical Provider, MD  hydrALAZINE (APRESOLINE) 50 MG tablet Take 50 mg by mouth 2 (two) times daily.    Yes Historical Provider, MD  lisinopril (PRINIVIL,ZESTRIL) 40 MG tablet Take 40 mg by mouth daily.   Yes Historical Provider, MD  metoprolol succinate (TOPROL-XL) 100 MG 24 hr tablet Take 100 mg by mouth daily. Take with or immediately following a meal.   Yes Historical Provider, MD  oxyCODONE-acetaminophen (PERCOCET/ROXICET) 5-325 MG per tablet Take 1 tablet by mouth every 4 (four) hours as needed for severe pain.   Yes Historical Provider, MD  promethazine (PHENERGAN) 25  MG tablet Take 25 mg by mouth every 6 (six) hours as needed for nausea or vomiting.   Yes Historical Provider, MD  sitaGLIPtin (JANUVIA) 100 MG tablet Take 100 mg by mouth daily.   Yes Historical Provider, MD     Allergies  Allergen Reactions  . Ibuprofen     Made gastric ulcers worse    Social History:  reports that he has never smoked. He has never used smokeless tobacco. He reports that he does not drink alcohol or use illicit drugs.     Family History  Problem Relation Age of Onset  .  Hypertension Mother   . Diabetes Mother   . Cancer Father   . Hypertension Brother   . Hypertension Sister        Physical Exam:  GEN: Pleasant Morbidly Obese 53 y.o. African American male examined  and in no acute distress; cooperative with exam Filed Vitals:   05/28/14 0000 05/28/14 0140 05/28/14 0230 05/28/14 0300  BP: 175/94 184/103 155/77 159/91  Pulse: 99 98 96 97  Temp:    98.3 F (36.8 C)  TempSrc:    Oral  Resp:    20  Height:    5\' 11"  (1.803 m)  Weight:    145.469 kg (320 lb 11.2 oz)  SpO2: 96% 96% 96% 96%   Blood pressure 159/91, pulse 97, temperature 98.3 F (36.8 C), temperature source Oral, resp. rate 20, height 5\' 11"  (1.803 m), weight 145.469 kg (320 lb 11.2 oz), SpO2 96.00%. PSYCH: He is alert and oriented x4; does not appear anxious does not appear depressed; affect is normal HEENT: Normocephalic and Atraumatic, Mucous membranes pink; PERRLA; EOM intact; Fundi:  Benign;  No scleral icterus, Nares: Patent, Oropharynx: Clear, Fair Dentition,    Neck:  FROM, No Cervical Lymphadenopathy nor Thyromegaly or Carotid Bruit; No JVD; Breasts:: Not examined CHEST WALL: No tenderness CHEST: Normal respiration, clear to auscultation bilaterally HEART: Regular rate and rhythm; no murmurs rubs or gallops BACK: No kyphosis or scoliosis; No CVA tenderness ABDOMEN: Positive Bowel Sounds, Scaphoid, Obese, Soft Non-Tender; No Masses, No Organomegaly. Rectal Exam: Not done EXTREMITIES: No Cyanosis, Clubbing, or Edema; No Ulcerations. Genitalia: not examined PULSES: 2+ and symmetric SKIN: Normal hydration no rash or ulceration CNS: Alert and Oriented x 4, No Focal Deficits Vascular: pulses palpable throughout    Labs on Admission:  Basic Metabolic Panel:  Recent Labs Lab 05/27/14 2000 05/28/14 0034  NA 138 141  K 3.4* 3.4*  CL 95* 98  CO2 22 24  GLUCOSE 219* 171*  BUN 9 8  CREATININE 0.55 0.55  CALCIUM 9.2 8.8   Liver Function Tests:  Recent Labs Lab  05/27/14 2000  AST 45*  ALT 37  ALKPHOS 67  BILITOT 0.3  PROT 7.6  ALBUMIN 3.7    Recent Labs Lab 05/27/14 2000  LIPASE 24   No results found for this basename: AMMONIA,  in the last 168 hours CBC:  Recent Labs Lab 05/27/14 2000  WBC 8.1  HGB 12.6*  HCT 36.1*  MCV 92.8  PLT 230   Cardiac Enzymes: No results found for this basename: CKTOTAL, CKMB, CKMBINDEX, TROPONINI,  in the last 168 hours  BNP (last 3 results) No results found for this basename: PROBNP,  in the last 8760 hours CBG:  Recent Labs Lab 05/27/14 1633  GLUCAP 276*    Radiological Exams on Admission: Dg Chest 2 View (if Patient Has Fever And/or Copd)  05/27/2014   CLINICAL DATA:  Shortness  of breath, chest pain for 1 week  EXAM: CHEST  2 VIEW  COMPARISON:  Chest x-ray of 03/14/2014  FINDINGS: No focal infiltrate or effusion is seen. Mediastinal hilar contours are stable as is cardiomegaly. There are degenerative changes throughout the thoracic spine.  IMPRESSION: No definite active process.  Stable cardiomegaly   Electronically Signed   By: Ivar Drape M.D.   On: 05/27/2014 16:59   Ct Cta Abd/pel W/cm &/or W/o Cm  05/28/2014   CLINICAL DATA:  Back and abdominal pain  EXAM: CTA ABDOMEN AND PELVIS WITH CONTRAST  TECHNIQUE: Multidetector CT imaging of the abdomen and pelvis was performed using the standard protocol during bolus administration of intravenous contrast. Multiplanar reconstructed images and MIPs were obtained and reviewed to evaluate the vascular anatomy.  CONTRAST:  130mL OMNIPAQUE IOHEXOL 350 MG/ML SOLN  COMPARISON:  03/02/2014  FINDINGS: BODY WALL: Unremarkable.  LOWER CHEST: Calcified granulomas in the right lower lobe.  ABDOMEN/PELVIS:  Liver: Marked in diffuse steatotic infiltration. There are enlarged lymph nodes within the deep liver drainage which are chronic and usually from chronic hepatocellular disease.  Biliary: No evidence of biliary obstruction or stone.  Pancreas: Unremarkable.   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 This largest lesion has been present without significant change from 2005 and is benign - possibly a remote infarct.  Adrenals: Unremarkable.  Kidneys and ureters: No hydronephrosis or stone.  Bladder: Unremarkable.  Reproductive: Unremarkable.  Bowel: No obstruction. Normal appendix.  Retroperitoneum: No mass or adenopathy.  Peritoneum: No ascites or pneumoperitoneum.  Vascular: Standard aortic branching pattern. No major vessel stenosis or occlusion. No significant atherosclerotic plaque deposition.  OSSEOUS: Sclerotic focus in the left iliac wing is stable from 2005 and benign.  Review of the MIP images confirms the above findings.  IMPRESSION: 1. No aortic dissection or other acute intra-abdominal abnormality. 2. Marked hepatic steatosis. 3. Other chronic findings are stable from prior and noted above.   Electronically Signed   By: Jorje Guild M.D.   On: 05/28/2014 01:43   US Abdomen Limited Ruq  05/27/2014   CLINICAL DATA:  Right upper quadrant pain.  EXAM: US ABDOMEN LIMITED - RIGHT UPPER QUADRANT  COMPARISON:  None.  FINDINGS: Gallbladder:  No gallstones or wall thickening visualized. No sonographic Murphy sign noted.  Common bile duct:  Diameter: 5 mm  Liver:  No focal lesion.  Increased in echogenicity.  IMPRESSION: Hepatic steatosis.  No cholelithiasis or sonographic evidence for acute cholecystitis.   Electronically Signed   By: Lovey Newcomer M.D.   On: 05/27/2014 21:23     EKG: Independently reviewed. Normal Sinus Rhythm at 97 with Age Indeterminant Infarcts of the Inferior and Anterior leads.       Assessment/Plan:   53 y.o. male with  Principal Problem:   1.   Abdominal pain   Monitor LFTs, and Lipase Levels   Pain control PRN    2.   Lactic acid acidosis-  Due to Early Sepsis, No Evidence of ischemia on CT scan of ABD   Improving with IVFs,   Monitor Levels      3.   Chest pain   Cardiac Monitoring   Cycle Troponins     4.   Uncontrolled secondary diabetes with peripheral neuropathy   Hold Glipizide and januvia   SSI coverage PRN, and Check HbA1c       5.   Dyslipidemia   No  Rx currently   Check Lipids      6.   HTN (hypertension)   Hold Lisinopril Rx   PRN IV Hydralazine for now.       7.   DVT Prophylaxis    Lovenox.      Code Status:  FULL CODE    Family Communication:  No Family Present Disposition Plan:   Inpatient       Time spent:  Alfred C [facility] Pager [phone]   If [place] Please Contact the Day Rounding Team MD for [facility]  If 7PM-7AM, Please Contact night-coverage  [email or web address] Password [facility] 05/28/2014, 6:21 AM

## 2014-05-28 NOTE — Progress Notes (Signed)
Patient got part of his K+ pill stuck in his throat. He cough for a second and the pill came out. Patient is stable and does not complain of anything else relating to matter. Patient broke the other pill and swallowed it with no issues. Will continue to monitor

## 2014-05-28 NOTE — ED Notes (Signed)
Nanavati, MD at bedside. 

## 2014-05-29 LAB — GLUCOSE, CAPILLARY
Glucose-Capillary: 211 mg/dL — ABNORMAL HIGH (ref 70–99)
Glucose-Capillary: 175 mg/dL — ABNORMAL HIGH (ref 70–99)
Glucose-Capillary: 249 mg/dL — ABNORMAL HIGH (ref 70–99)
Glucose-Capillary: 223 mg/dL — ABNORMAL HIGH (ref 70–99)

## 2014-05-29 LAB — BASIC METABOLIC PANEL
Anion gap: 17 — ABNORMAL HIGH (ref 5–15)
BUN: 9 mg/dL (ref 6–23)
CO2: 23 mEq/L (ref 19–32)
Calcium: 9 mg/dL (ref 8.4–10.5)
Chloride: 95 mEq/L — ABNORMAL LOW (ref 96–112)
Creatinine, Ser: 0.51 mg/dL (ref 0.50–1.35)
GFR calc Af Amer: >90 mL/min (ref >90)
GFR calc non Af Amer: >90 mL/min (ref >90)
Glucose, Bld: 216 mg/dL — ABNORMAL HIGH (ref 70–99)
Potassium: 3.7 mEq/L (ref 3.7–5.3)
Sodium: 135 mEq/L — ABNORMAL LOW (ref 137–147)

## 2014-05-29 MED ORDER — LISINOPRIL 40 MG PO TABS
40.0000 mg | ORAL_TABLET | Freq: Every day | ORAL | Status: DC
  Administered 2014-05-29 – 2014-06-01 (×4): 40 mg via ORAL
  Filled 2014-05-29 (×4): qty 1

## 2014-05-29 MED ORDER — HYDRALAZINE HCL 50 MG PO TABS: 50.0000 mg | ORAL_TABLET | Freq: Three times a day (TID) | ORAL | Status: DC

## 2014-05-29 MED ORDER — AMLODIPINE BESYLATE 10 MG PO TABS
10.0000 mg | ORAL_TABLET | Freq: Every day | ORAL | Status: DC
Start: 2014-05-29 — End: 2014-06-01
  Administered 2014-05-29 – 2014-06-01 (×4): 10 mg via ORAL
  Filled 2014-05-29 (×4): qty 1

## 2014-05-29 MED ORDER — HYDRALAZINE HCL 50 MG PO TABS
50.0000 mg | ORAL_TABLET | Freq: Three times a day (TID) | ORAL | Status: DC
  Administered 2014-05-29 – 2014-06-01 (×10): 50 mg via ORAL
  Filled 2014-05-29 (×12): qty 1

## 2014-05-29 NOTE — Progress Notes (Signed)
Patient asked for something to help him use the restroom- paging NP.

## 2014-05-29 NOTE — Progress Notes (Signed)
Pt BP maintain elevated even after iv hydralazine. IV pain med given to patient for pain but SBP still above 160. Karleen Hampshire MD notified and made aware.

## 2014-05-29 NOTE — Progress Notes (Signed)
[facility] PROGRESS NOTE  [name] RKY:[id number] DOB: [birth year] DOA: 05/27/2014 PCP: Marry Guan Interim summary: [name] is a 53 y.o. male with a history of DM2, HTN, and PUD who presents to the ED with complaints of SOB, RUQ ABD Pain that radiates into his back that had sudden onset and lasted 20 -30 minutes tonight. He as found to have hepatic steatosis and elevated lactic acid levels. He was admitted to medical service for further recommendations.   Assessment/Plan: 1. Abdominal pain: Probably from hepatic steatosis. Its resolved now. No nausea or vomiting. Liver fucntion panel within normal limits except for mildly elevated AST. Repeat liver panel in am.   2 . Uncontrolled DM: HGBA1C IS 9.2. Resume home medications and SSI.  CBG (last 3)   Recent Labs  05/29/14 0743 05/29/14 1139 05/29/14 1702  GLUCAP 211* 175* 249*   3. Atypical chest pain: - point of care troponin negative. EKG sinus .  - repeat EKG pending.   4. Lactic acidosis: Probably from dehydration.  Improved with fludis.  Repeat level in am.   5. Accelerated hypertension: - resume home medications. Prn hydralazine.  - start amlodipine.   6. Dyslipidemia: - resume static.      Code Status: full code.  Family Communication: none at bedside Disposition Plan: pending. , possibly in am.    Consultants:  none  Procedures:  US abdomen.  Antibiotics:  none  HPI/Subjective: No abdominal pain. Back pain occasionally.   Objective: Filed Vitals:   05/29/14 1603  BP: 177/92  Pulse:   Temp:   Resp:     Intake/Output Summary (Last 24 hours) at 05/29/14 1835 Last data filed at 05/29/14 1400  Gross per 24 hour  Intake    525 ml  Output      0 ml  Net    525 ml   Filed Weights   05/27/14 1636 05/28/14 0300  Weight: 146.285 kg (322 lb 8 oz) 145.469 kg (320 lb 11.2 oz)    Exam:   General:  Alert afebrile comfortable  Cardiovascular: s1s2  Respiratory:  ctab  Abdomen: soft obese, NT nd bs+  Musculoskeletal: pedal edema present.   Data Reviewed: Basic Metabolic Panel:  Recent Labs Lab 05/27/14 2000 05/28/14 0034 05/28/14 0619 05/29/14 1032  NA 138 141 140 135*  K 3.4* 3.4* 3.2* 3.7  CL 95* 98 98 95*  CO2 22 24 24 23   GLUCOSE 219* 171* 197* 216*  BUN 9 8 8 9   CREATININE 0.55 0.55 0.56 0.51  CALCIUM 9.2 8.8 8.5 9.0   Liver Function Tests:  Recent Labs Lab 05/27/14 2000 05/28/14 0619  AST 45* 40*  ALT 37 39  ALKPHOS 67 56  BILITOT 0.3 0.4  PROT 7.6 7.3  ALBUMIN 3.7 3.7    Recent Labs Lab 05/27/14 2000  LIPASE 24   No results found for this basename: AMMONIA,  in the last 168 hours CBC:  Recent Labs Lab 05/27/14 2000 05/28/14 0619  WBC 8.1 7.1  HGB 12.6* 12.4*  HCT 36.1* 37.4*  MCV 92.8 95.7  PLT 230 186   Cardiac Enzymes: No results found for this basename: CKTOTAL, CKMB, CKMBINDEX, TROPONINI,  in the last 168 hours BNP (last 3 results) No results found for this basename: PROBNP,  in the last 8760 hours CBG:  Recent Labs Lab 05/28/14 1709 05/28/14 2150 05/29/14 0743 05/29/14 1139 05/29/14 1702  GLUCAP 231* 216* 211* 175* 249*    No results found for this or  any previous visit (from the past 240 hour(s)).   Studies: Ct Cta Abd/pel W/cm &/or W/o Cm  05/28/2014   CLINICAL DATA:  Back and abdominal pain  EXAM: CTA ABDOMEN AND PELVIS WITH CONTRAST  TECHNIQUE: Multidetector CT imaging of the abdomen and pelvis was performed using the standard protocol during bolus administration of intravenous contrast. Multiplanar reconstructed images and MIPs were obtained and reviewed to evaluate the vascular anatomy.  CONTRAST:  122mL OMNIPAQUE IOHEXOL 350 MG/ML SOLN  COMPARISON:  03/02/2014  FINDINGS: BODY WALL: Unremarkable.  LOWER CHEST: Calcified granulomas in the right lower lobe.  ABDOMEN/PELVIS:  Liver: Marked in diffuse steatotic infiltration. There are enlarged lymph nodes within the deep liver drainage  which are chronic and usually from chronic hepatocellular disease.  Biliary: No evidence of biliary obstruction or stone.  Pancreas: Unremarkable.  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 This largest lesion has been present without significant change from 2005 and is benign - possibly a remote infarct.  Adrenals: Unremarkable.  Kidneys and ureters: No hydronephrosis or stone.  Bladder: Unremarkable.  Reproductive: Unremarkable.  Bowel: No obstruction. Normal appendix.  Retroperitoneum: No mass or adenopathy.  Peritoneum: No ascites or pneumoperitoneum.  Vascular: Standard aortic branching pattern. No major vessel stenosis or occlusion. No significant atherosclerotic plaque deposition.  OSSEOUS: Sclerotic focus in the left iliac wing is stable from 2005 and benign.  Review of the MIP images confirms the above findings.  IMPRESSION: 1. No aortic dissection or other acute intra-abdominal abnormality. 2. Marked hepatic steatosis. 3. Other chronic findings are stable from prior and noted above.   Electronically Signed   By: Jorje Guild M.D.   On: 05/28/2014 01:43   US Abdomen Limited Ruq  05/27/2014   CLINICAL DATA:  Right upper quadrant pain.  EXAM: US ABDOMEN LIMITED - RIGHT UPPER QUADRANT  COMPARISON:  None.  FINDINGS: Gallbladder:  No gallstones or wall thickening visualized. No sonographic Murphy sign noted.  Common bile duct:  Diameter: 5 mm  Liver:  No focal lesion.  Increased in echogenicity.  IMPRESSION: Hepatic steatosis.  No cholelithiasis or sonographic evidence for acute cholecystitis.   Electronically Signed   By: Lovey Newcomer M.D.   On: 05/27/2014 21:23    Scheduled Meds: . amitriptyline  25 mg Oral QHS  . amLODipine  10 mg Oral Daily  . cloNIDine  0.1 mg Oral BID  . enoxaparin (LOVENOX) injection  70 mg Subcutaneous Q24H  . hydrALAZINE  50 mg Oral 3 times per day  . insulin aspart  0-20 Units  Subcutaneous TID WC  . insulin aspart  0-5 Units Subcutaneous QHS  . lisinopril  40 mg Oral Daily  . metoprolol succinate  100 mg Oral Daily  . pantoprazole  40 mg Oral Daily   Continuous Infusions:   Principal Problem:   Abdominal pain Active Problems:   Chest pain   Uncontrolled secondary diabetes with peripheral neuropathy   Dyslipidemia   HTN (hypertension)   Lactic acid acidosis    Time spent: 30 minutes.     [facility] Pager [phone]. If 7PM-7AM, please contact night-coverage at [email or web address], password [facility] 05/29/2014, 6:35 PM  LOS: 2 days

## 2014-05-29 NOTE — Progress Notes (Signed)
BP still elevated even after BP medications given. Pt also c/o dizziness with standing and a Headache. Karleen Hampshire MD made aware again. Will continue to monitor pt.

## 2014-05-29 NOTE — Progress Notes (Signed)
Pt stated that he doesn't use CPAP at home and doesn't want to use one here.  RT to monitor and assess as needed.

## 2014-05-29 NOTE — Care Management Note (Signed)
    Page 1 of 1   05/29/2014     4:52:53 PM CARE MANAGEMENT NOTE 05/29/2014  Patient:  [name], [name]   Account Number:  [id number]  Date Initiated:  05/29/2014  Documentation initiated by:  Tomi Bamberger  Subjective/Objective Assessment:   dx sob,chest and back pain  admiit- lives with sister. pta indep.     Action/Plan:   Anticipated DC Date:  05/29/2014   Anticipated DC Plan:  HOME/SELF CARE      DC Planning Services  CM consult      PAC Choice  DURABLE MEDICAL EQUIPMENT   Choice offered to / List presented to:  C-1 Patient   DME arranged  CPAP      DME agency  [place].        Status of service:  Completed, signed off Medicare Important Message given?  NO (If response is "NO", the following Medicare IM given date fields will be blank) Date Medicare IM given:   Medicare IM given by:   Date Additional Medicare IM given:   Additional Medicare IM given by:    Discharge Disposition:  HOME/SELF CARE  Per UR Regulation:  Reviewed for med. necessity/level of care/duration of stay  If discussed at [place] of Stay Meetings, dates discussed:    Comments:  05/29/14 [place], BSN [phone] patient is for dc today, NCM recieved sleep study from [facility], order put in for cpap , Jermaine with [facility] notified.

## 2014-05-29 NOTE — Progress Notes (Signed)
Inpatient Diabetes Program Recommendations  AACE/ADA: New Consensus Statement on Inpatient Glycemic Control (2013)  Target Ranges:  Prepandial:   less than 140 mg/dL      Peak postprandial:   less than 180 mg/dL (1-2 hours)      Critically ill patients:  140 - 180 mg/dL  Results for [name], [name] (MRN [id number]) as of 05/29/2014 14:49  Ref. Range 05/28/2014 11:58 05/28/2014 17:09 05/28/2014 21:50 05/29/2014 07:43 05/29/2014 11:39  Glucose-Capillary Latest Range: 70-99 mg/dL 237 (H) 231 (H) 216 (H) 211 (H) 175 (H)   Inpatient Diabetes Program Recommendations Insulin - Basal: add Lantus 15 units Correction (SSI): Marland Kitchen Thank you  Raoul Pitch BSN, RN,CDE Inpatient Diabetes Coordinator [phone] (team pager)

## 2014-05-29 NOTE — Evaluation (Signed)
Physical Therapy Evaluation Patient Details Name: [name] MRN: [id number] DOB: [birth year] Today's Date: 05/29/2014   History of Present Illness  Patient is a 53 y/o male admitted with SOB, chest pain and back pain. PMH positive for DM2, HTN, and PUD. Noted to have granulomatous disease of the liver and spleen and staeatosis of the liver.   Clinical Impression  Patient functioning close to baseline and able to safely ambulate community distances and negotiate steps without difficulty. Pt becomes SOB with activity- which is premorbid. Pt aware to take frequent rest breaks as needed. Pt does not require skilled therapy services at this time. Safe to discharge from a mobility standpoint. Encourage mobility while in hospital to maintain strength. Pt agreeable. Elevated BP noted post evaluation. Rn made aware.     Follow Up Recommendations No PT follow up    Equipment Recommendations  None recommended by PT    Recommendations for Other Services       Precautions / Restrictions Precautions Precautions: None Restrictions Weight Bearing Restrictions: No      Mobility  Bed Mobility               General bed mobility comments: Sitting in chair upon arrival.   Transfers Overall transfer level: Independent                  Ambulation/Gait Ambulation/Gait assistance: Modified independent (Device/Increase time) Ambulation Distance (Feet): 200 Feet Assistive device: None Gait Pattern/deviations: Step-through pattern;Wide base of support     General Gait Details: Wide BoS most likely due to body habitus. SOB present with ambulation. Episode of dizziness post ambulation and steps, resolved after a few minutes in seated position. BP 164/109. RN made aware.  Stairs Stairs: Yes Stairs assistance: Modified independent (Device/Increase time) Stair Management: Alternating pattern;Two rails Number of Stairs: 12 General stair comments: SOB post stair negotiation -  reports as morbid.  Wheelchair Mobility    Modified Rankin (Stroke Patients Only)       Balance Overall balance assessment: Independent                                           Pertinent Vitals/Pain Pain Assessment: No/denies pain    Home Living Family/patient expects to be discharged to:: Private residence Living Arrangements: Other relatives Available Help at Discharge: Family;Available PRN/intermittently Type of Home: Apartment Home Access: Stairs to enter Entrance Stairs-Rails: Left;Right Entrance Stairs-Number of Steps: 3 flights of steps. Home Layout: One level Home Equipment: None      Prior Function Level of Independence: Independent         Comments: Does not drive. Living with mother right now in [place].     Hand Dominance        Extremity/Trunk Assessment   Upper Extremity Assessment: Overall WFL for tasks assessed           Lower Extremity Assessment: Overall WFL for tasks assessed         Communication   Communication: No difficulties  Cognition Arousal/Alertness: Awake/alert Behavior During Therapy: WFL for tasks assessed/performed Overall Cognitive Status: Within Functional Limits for tasks assessed                      General Comments      Exercises        Assessment/Plan    PT Assessment Patent does not need any  further PT services  PT Diagnosis     PT Problem List    PT Treatment Interventions     PT Goals (Current goals can be found in the Care Plan section) Acute Rehab PT Goals PT Goal Formulation: No goals set, d/c therapy    Frequency     Barriers to discharge        Co-evaluation               End of Session   Activity Tolerance: Patient tolerated treatment well Patient left: in chair;with call bell/phone within reach Nurse Communication: Mobility status         Time: 4332-9518 PT Time Calculation (min): 14 min   Charges:   PT Evaluation $Initial PT  Evaluation Tier I: 1 Procedure     PT G CodesCandy Sledge A 05/29/2014, 3:44 PM Candy Sledge, [place], DPT [phone]

## 2014-05-30 LAB — HEPATIC FUNCTION PANEL
ALT: 49 U/L (ref 0–53)
AST: 57 U/L — ABNORMAL HIGH (ref 0–37)
Albumin: 3.8 g/dL (ref 3.5–5.2)
Alkaline Phosphatase: 64 U/L (ref 39–117)
Bilirubin, Direct: <0.2 mg/dL (ref 0.0–0.3)
Total Bilirubin: 0.5 mg/dL (ref 0.3–1.2)
Total Protein: 7.6 g/dL (ref 6.0–8.3)

## 2014-05-30 LAB — BASIC METABOLIC PANEL
Anion gap: 17 — ABNORMAL HIGH (ref 5–15)
BUN: 9 mg/dL (ref 6–23)
CO2: 24 mEq/L (ref 19–32)
Calcium: 9.6 mg/dL (ref 8.4–10.5)
Chloride: 97 mEq/L (ref 96–112)
Creatinine, Ser: 0.59 mg/dL (ref 0.50–1.35)
GFR calc Af Amer: >90 mL/min (ref >90)
GFR calc non Af Amer: >90 mL/min (ref >90)
Glucose, Bld: 258 mg/dL — ABNORMAL HIGH (ref 70–99)
Potassium: 3.7 mEq/L (ref 3.7–5.3)
Sodium: 138 mEq/L (ref 137–147)

## 2014-05-30 LAB — GLUCOSE, CAPILLARY
Glucose-Capillary: 189 mg/dL — ABNORMAL HIGH (ref 70–99)
Glucose-Capillary: 253 mg/dL — ABNORMAL HIGH (ref 70–99)
Glucose-Capillary: 214 mg/dL — ABNORMAL HIGH (ref 70–99)
Glucose-Capillary: 229 mg/dL — ABNORMAL HIGH (ref 70–99)

## 2014-05-30 LAB — LACTATE DEHYDROGENASE: LDH: 266 U/L — ABNORMAL HIGH (ref 94–250)

## 2014-05-30 LAB — LACTIC ACID, PLASMA: Lactic Acid, Venous: 3.3 mmol/L — ABNORMAL HIGH (ref 0.5–2.2)

## 2014-05-30 MED ORDER — ISOSORBIDE MONONITRATE ER 30 MG PO TB24
30.0000 mg | ORAL_TABLET | Freq: Every day | ORAL | Status: DC
  Administered 2014-05-30 – 2014-06-01 (×3): 30 mg via ORAL
  Filled 2014-05-30 (×3): qty 1

## 2014-05-30 MED ORDER — INSULIN GLARGINE 100 UNIT/ML ~~LOC~~ SOLN
10.0000 [IU] | Freq: Every day | SUBCUTANEOUS | Status: DC
  Administered 2014-05-30: 10 [IU] via SUBCUTANEOUS
  Filled 2014-05-30 (×2): qty 0.1

## 2014-05-30 MED ORDER — SODIUM CHLORIDE 0.9 % IV SOLN
INTRAVENOUS | Status: DC
  Administered 2014-05-30 (×2): 100 mL/h via INTRAVENOUS
  Administered 2014-05-31 – 2014-06-01 (×2): via INTRAVENOUS

## 2014-05-30 NOTE — Progress Notes (Signed)
Pt states he doesn't have his cpap at home yet and does not want to try our cpap as he "does not sleep well in the bed here anyway" encouraged pt to call if he changed his mind, pt communicates understanding.

## 2014-05-30 NOTE — Progress Notes (Signed)
Inpatient Diabetes Program Recommendations  AACE/ADA: New Consensus Statement on Inpatient Glycemic Control (2013)  Target Ranges:  Prepandial:   less than 140 mg/dL      Peak postprandial:   less than 180 mg/dL (1-2 hours)      Critically ill patients:  140 - 180 mg/dL  Results for AMBER, GUTHRIDGE (MRN [id number]) as of 05/30/2014 14:23  Ref. Range 05/29/2014 21:33 05/30/2014 08:05 05/30/2014 11:36  Glucose-Capillary Latest Range: 70-99 mg/dL 223 (H) 189 (H) 253 (H)   Consider adding low dose basal and Novolog meal coverage TID per Glycemic Control order-set.  Thank you  Raoul Pitch BSN, RN,CDE Inpatient Diabetes Coordinator [phone] (team pager)

## 2014-05-30 NOTE — Progress Notes (Signed)
[facility] PROGRESS NOTE  IWAO SHAMBLIN GYI:[id number] DOB: 11-Nov-1960 DOA: 05/27/2014 PCP: Marry Guan Interim summary: [name] is a 53 y.o. male with a history of DM2, HTN, and PUD who presents to the ED with complaints of SOB, RUQ ABD Pain that radiates into his back that had sudden onset and lasted 20 -30 minutes tonight. He as found to have hepatic steatosis and elevated lactic acid levels. He was admitted to medical service for further recommendations.   Assessment/Plan: 1. Abdominal pain: Probably from hepatic steatosis. Its resolved now. No nausea or vomiting. Liver fucntion panel within normal limits except for mildly elevated AST. Repeat liver panel in am the same.   2 . Uncontrolled DM: HGBA1C IS 9.2. Resume home medications and SSI. Started on lantus tonight.  CBG (last 3)   Recent Labs  05/29/14 2133 05/30/14 0805 05/30/14 1136  GLUCAP 223* 189* 253*   3. Atypical chest pain: - point of care troponin negative. EKG sinus .  - repeat EKG show non specific t wave changes. Echocardiogram ordered.   4. Lactic acidosis: Probably from dehydration.  Repeat level in am show persistently high . CT angiogram of the abdomen is negative.   5. Accelerated hypertension: - resume home medications. Prn hydralazine.  - start amlodipine.   6. Dyslipidemia: - resume statin.  DVT prophylaxis.      Code Status: full code.  Family Communication: none at bedside Disposition Plan: pending. , possibly in am.    Consultants:  none  Procedures:  US abdomen.  Antibiotics:  none  HPI/Subjective: No abdominal pain. Back pain occasionally.   Objective: Filed Vitals:   05/30/14 1300  BP: 174/87  Pulse: 83  Temp: 98 F (36.7 C)  Resp: 16    Intake/Output Summary (Last 24 hours) at 05/30/14 1715 Last data filed at 05/30/14 0210  Gross per 24 hour  Intake      0 ml  Output      2 ml  Net     -2 ml   Filed Weights   05/27/14 1636 05/28/14  0300  Weight: 146.285 kg (322 lb 8 oz) 145.469 kg (320 lb 11.2 oz)    Exam:   General:  Alert afebrile comfortable  Cardiovascular: s1s2  Respiratory: ctab  Abdomen: soft obese, NT nd bs+  Musculoskeletal: pedal edema present.   Data Reviewed: Basic Metabolic Panel:  Recent Labs Lab 05/27/14 2000 05/28/14 0034 05/28/14 0619 05/29/14 1032 05/30/14 1025  NA 138 141 140 135* 138  K 3.4* 3.4* 3.2* 3.7 3.7  CL 95* 98 98 95* 97  CO2 22 24 24 23 24   GLUCOSE 219* 171* 197* 216* 258*  BUN 9 8 8 9 9   CREATININE 0.55 0.55 0.56 0.51 0.59  CALCIUM 9.2 8.8 8.5 9.0 9.6   Liver Function Tests:  Recent Labs Lab 05/27/14 2000 05/28/14 0619 05/30/14 0502  AST 45* 40* 57*  ALT 37 39 49  ALKPHOS 67 56 64  BILITOT 0.3 0.4 0.5  PROT 7.6 7.3 7.6  ALBUMIN 3.7 3.7 3.8    Recent Labs Lab 05/27/14 2000  LIPASE 24   No results found for this basename: AMMONIA,  in the last 168 hours CBC:  Recent Labs Lab 05/27/14 2000 05/28/14 0619  WBC 8.1 7.1  HGB 12.6* 12.4*  HCT 36.1* 37.4*  MCV 92.8 95.7  PLT 230 186   Cardiac Enzymes: No results found for this basename: CKTOTAL, CKMB, CKMBINDEX, TROPONINI,  in the last 168 hours BNP (  last 3 results) No results found for this basename: PROBNP,  in the last 8760 hours CBG:  Recent Labs Lab 05/29/14 1139 05/29/14 1702 05/29/14 2133 05/30/14 0805 05/30/14 1136  GLUCAP 175* 249* 223* 189* 253*    No results found for this or any previous visit (from the past 240 hour(s)).   Studies: No results found.  Scheduled Meds: . amitriptyline  25 mg Oral QHS  . amLODipine  10 mg Oral Daily  . cloNIDine  0.1 mg Oral BID  . enoxaparin (LOVENOX) injection  70 mg Subcutaneous Q24H  . hydrALAZINE  50 mg Oral 3 times per day  . insulin aspart  0-20 Units Subcutaneous TID WC  . insulin aspart  0-5 Units Subcutaneous QHS  . insulin glargine  10 Units Subcutaneous QHS  . isosorbide mononitrate  30 mg Oral Daily  . lisinopril  40  mg Oral Daily  . metoprolol succinate  100 mg Oral Daily  . pantoprazole  40 mg Oral Daily   Continuous Infusions: . sodium chloride 100 mL/hr (05/30/14 1549)    Principal Problem:   Abdominal pain Active Problems:   Chest pain   Uncontrolled secondary diabetes with peripheral neuropathy   Dyslipidemia   HTN (hypertension)   Lactic acid acidosis    Time spent: 20 minutes.     [facility] Pager [phone]. If 7PM-7AM, please contact night-coverage at [email or web address], password [facility] 05/30/2014, 5:15 PM  LOS: 3 days

## 2014-05-31 DIAGNOSIS — I517 Cardiomegaly: Secondary | ICD-10-CM

## 2014-05-31 LAB — BASIC METABOLIC PANEL
Anion gap: 18 — ABNORMAL HIGH (ref 5–15)
BUN: 8 mg/dL (ref 6–23)
CO2: 23 mEq/L (ref 19–32)
Calcium: 9.1 mg/dL (ref 8.4–10.5)
Chloride: 95 mEq/L — ABNORMAL LOW (ref 96–112)
Creatinine, Ser: 0.51 mg/dL (ref 0.50–1.35)
GFR calc Af Amer: >90 mL/min (ref >90)
GFR calc non Af Amer: >90 mL/min (ref >90)
Glucose, Bld: 229 mg/dL — ABNORMAL HIGH (ref 70–99)
Potassium: 3.4 mEq/L — ABNORMAL LOW (ref 3.7–5.3)
Sodium: 136 mEq/L — ABNORMAL LOW (ref 137–147)

## 2014-05-31 LAB — GLUCOSE, CAPILLARY
Glucose-Capillary: 219 mg/dL — ABNORMAL HIGH (ref 70–99)
Glucose-Capillary: 214 mg/dL — ABNORMAL HIGH (ref 70–99)
Glucose-Capillary: 283 mg/dL — ABNORMAL HIGH (ref 70–99)
Glucose-Capillary: 198 mg/dL — ABNORMAL HIGH (ref 70–99)

## 2014-05-31 LAB — LACTIC ACID, PLASMA: Lactic Acid, Venous: 3.5 mmol/L — ABNORMAL HIGH (ref 0.5–2.2)

## 2014-05-31 MED ORDER — INSULIN ASPART 100 UNIT/ML ~~LOC~~ SOLN
2.0000 [IU] | Freq: Three times a day (TID) | SUBCUTANEOUS | Status: DC
  Administered 2014-05-31 – 2014-06-01 (×2): 2 [IU] via SUBCUTANEOUS

## 2014-05-31 MED ORDER — GLIPIZIDE 5 MG PO TABS
5.0000 mg | ORAL_TABLET | Freq: Two times a day (BID) | ORAL | Status: DC
  Administered 2014-05-31 – 2014-06-01 (×2): 5 mg via ORAL
  Filled 2014-05-31 (×5): qty 1

## 2014-05-31 MED ORDER — LINAGLIPTIN 5 MG PO TABS
5.0000 mg | ORAL_TABLET | Freq: Every day | ORAL | Status: DC
  Administered 2014-05-31 – 2014-06-01 (×2): 5 mg via ORAL
  Filled 2014-05-31 (×2): qty 1

## 2014-05-31 MED ORDER — INSULIN GLARGINE 100 UNIT/ML ~~LOC~~ SOLN
15.0000 [IU] | Freq: Every day | SUBCUTANEOUS | Status: DC
  Filled 2014-05-31: qty 0.15

## 2014-05-31 MED ORDER — POTASSIUM CHLORIDE CRYS ER 20 MEQ PO TBCR
40.0000 meq | EXTENDED_RELEASE_TABLET | Freq: Two times a day (BID) | ORAL | Status: AC
  Administered 2014-05-31 – 2014-06-01 (×2): 40 meq via ORAL
  Filled 2014-05-31 (×2): qty 2

## 2014-05-31 MED ORDER — INSULIN ASPART PROT & ASPART (70-30 MIX) 100 UNIT/ML ~~LOC~~ SUSP
10.0000 [IU] | Freq: Two times a day (BID) | SUBCUTANEOUS | Status: DC
  Administered 2014-05-31 – 2014-06-01 (×2): 10 [IU] via SUBCUTANEOUS
  Filled 2014-05-31: qty 10

## 2014-05-31 NOTE — Progress Notes (Signed)
  Echocardiogram 2D Echocardiogram has been performed.  Darlina Sicilian M 05/31/2014, 12:44 PM

## 2014-05-31 NOTE — Progress Notes (Signed)
Pt refused cpap, but knows he can call at anytime if he changes his mind. Rt will continue to monitor.

## 2014-05-31 NOTE — Progress Notes (Signed)
[facility] PROGRESS NOTE  [name]:[id number] DOB: 09/27/1961 DOA: 05/27/2014 PCP: [name] Interim summary: [name] is a 53 y.o. male with a history of DM2, HTN, and PUD who presents to the ED with complaints of SOB, RUQ ABD Pain that radiates into his back that had sudden onset and lasted 20 -30 minutes tonight. He as found to have hepatic steatosis and elevated lactic acid levels. He was admitted to medical service for further recommendations.   Assessment/Plan: 1. Abdominal pain: Probably from hepatic steatosis. Its resolved now. No nausea or vomiting. Liver fucntion panel within normal limits except for mildly elevated AST. Repeat liver panel in am the same.   2 . Uncontrolled DM: HGBA1C IS 9.2. Resume home medications and SSI. Started on lantus, later on changed to novolog 70/30 as lantus is expensive. He is adamantly refusing to be on insulin at home. His lactic acid is still elevated and he appears to be in mild DKA WITH gap of 17 to 18. i have added on a little meal coverage and increased the novlog 70/30 and resumed glipizide and januvia. Will continue to monitor.  CBG (last 3)   Recent Labs  05/31/14 0800 05/31/14 1221 05/31/14 1726  GLUCAP 219* 214* 283*   3. Atypical chest pain: - point of care troponin negative. EKG sinus .  - repeat EKG show non specific t wave changes. Echocardiogram ordered.   4. Lactic acidosis: Probably from dehydration.  Repeat level in am show persistently high . CT angiogram of the abdomen is negative.   5. Accelerated hypertension: - resume home medications. Prn hydralazine.  - start amlodipine.   6. Dyslipidemia: - resume statin.  DVT prophylaxis.      Code Status: full code.  Family Communication: none at bedside Disposition Plan: pending. , possibly in am.    Consultants:  none  Procedures:  US abdomen.  Antibiotics:  none  HPI/Subjective: No abdominal pain. Back pain occasionally.    Objective: Filed Vitals:   05/31/14 1432  BP: 121/71  Pulse: 80  Temp: 98.6 F (37 C)  Resp: 18    Intake/Output Summary (Last 24 hours) at 05/31/14 1830 Last data filed at 05/31/14 0700  Gross per 24 hour  Intake   1290 ml  Output      0 ml  Net   1290 ml   Filed Weights   05/27/14 1636 05/28/14 0300  Weight: 146.285 kg (322 lb 8 oz) 145.469 kg (320 lb 11.2 oz)    Exam:   General:  Alert afebrile comfortable  Cardiovascular: s1s2  Respiratory: ctab  Abdomen: soft obese, NT nd bs+  Musculoskeletal: pedal edema present.   Data Reviewed: Basic Metabolic Panel:  Recent Labs Lab 05/28/14 0034 05/28/14 0619 05/29/14 1032 05/30/14 1025 05/31/14 1425  NA 141 140 135* 138 136*  K 3.4* 3.2* 3.7 3.7 3.4*  CL 98 98 95* 97 95*  CO2 24 24 23 24 23   GLUCOSE 171* 197* 216* 258* 229*  BUN 8 8 9 9 8   CREATININE 0.55 0.56 0.51 0.59 0.51  CALCIUM 8.8 8.5 9.0 9.6 9.1   Liver Function Tests:  Recent Labs Lab 05/27/14 2000 05/28/14 0619 05/30/14 0502  AST 45* 40* 57*  ALT 37 39 49  ALKPHOS 67 56 64  BILITOT 0.3 0.4 0.5  PROT 7.6 7.3 7.6  ALBUMIN 3.7 3.7 3.8    Recent Labs Lab 05/27/14 2000  LIPASE 24   No results found for this basename: AMMONIA,  in the last 168 hours CBC:  Recent Labs Lab 05/27/14 2000 05/28/14 0619  WBC 8.1 7.1  HGB 12.6* 12.4*  HCT 36.1* 37.4*  MCV 92.8 95.7  PLT 230 186   Cardiac Enzymes: No results found for this basename: CKTOTAL, CKMB, CKMBINDEX, TROPONINI,  in the last 168 hours BNP (last 3 results) No results found for this basename: PROBNP,  in the last 8760 hours CBG:  Recent Labs Lab 05/30/14 1719 05/30/14 2111 05/31/14 0800 05/31/14 1221 05/31/14 1726  GLUCAP 214* 229* 219* 214* 283*    No results found for this or any previous visit (from the past 240 hour(s)).   Studies: No results found.  Scheduled Meds: . amitriptyline  25 mg Oral QHS  . amLODipine  10 mg Oral Daily  . cloNIDine  0.1 mg  Oral BID  . enoxaparin (LOVENOX) injection  70 mg Subcutaneous Q24H  . glipiZIDE  5 mg Oral BID AC  . hydrALAZINE  50 mg Oral 3 times per day  . insulin aspart  0-20 Units Subcutaneous TID WC  . insulin aspart  0-5 Units Subcutaneous QHS  . insulin aspart  2 Units Subcutaneous TID WC  . insulin aspart protamine- aspart  10 Units Subcutaneous BID WC  . isosorbide mononitrate  30 mg Oral Daily  . linagliptin  5 mg Oral Daily  . lisinopril  40 mg Oral Daily  . metoprolol succinate  100 mg Oral Daily  . pantoprazole  40 mg Oral Daily  . potassium chloride  40 mEq Oral BID   Continuous Infusions: . sodium chloride 100 mL/hr at 05/31/14 0200    Principal Problem:   Abdominal pain Active Problems:   Chest pain   Uncontrolled secondary diabetes with peripheral neuropathy   Dyslipidemia   HTN (hypertension)   Lactic acid acidosis    Time spent: 20 minutes.     [facility] Pager [phone]. If 7PM-7AM, please contact night-coverage at [email or web address], password [facility] 05/31/2014, 6:30 PM  LOS: 4 days

## 2014-06-01 LAB — BASIC METABOLIC PANEL
Anion gap: 16 — ABNORMAL HIGH (ref 5–15)
BUN: 9 mg/dL (ref 6–23)
CO2: 24 mEq/L (ref 19–32)
Calcium: 9.3 mg/dL (ref 8.4–10.5)
Chloride: 100 mEq/L (ref 96–112)
Creatinine, Ser: 0.53 mg/dL (ref 0.50–1.35)
GFR calc Af Amer: >90 mL/min (ref >90)
GFR calc non Af Amer: >90 mL/min (ref >90)
Glucose, Bld: 209 mg/dL — ABNORMAL HIGH (ref 70–99)
Potassium: 3.3 mEq/L — ABNORMAL LOW (ref 3.7–5.3)
Sodium: 140 mEq/L (ref 137–147)

## 2014-06-01 LAB — GLUCOSE, CAPILLARY
Glucose-Capillary: 174 mg/dL — ABNORMAL HIGH (ref 70–99)
Glucose-Capillary: 182 mg/dL — ABNORMAL HIGH (ref 70–99)

## 2014-06-01 MED ORDER — GLIPIZIDE 5 MG PO TABS: 7.5000 mg | ORAL_TABLET | Freq: Two times a day (BID) | ORAL | Status: DC

## 2014-06-01 MED ORDER — OXYCODONE-ACETAMINOPHEN 5-325 MG PO TABS: 1.0000 | ORAL_TABLET | ORAL | Status: DC | PRN

## 2014-06-01 MED ORDER — POTASSIUM CHLORIDE CRYS ER 20 MEQ PO TBCR
40.0000 meq | EXTENDED_RELEASE_TABLET | Freq: Two times a day (BID) | ORAL | Status: DC
  Administered 2014-06-01: 40 meq via ORAL
  Filled 2014-06-01: qty 2

## 2014-06-01 MED ORDER — AMLODIPINE BESYLATE 10 MG PO TABS
10.0000 mg | ORAL_TABLET | Freq: Every day | ORAL | Status: DC
Start: 2014-06-01 — End: 2016-08-30

## 2014-06-01 NOTE — Progress Notes (Signed)
Pt given 1 mg dilaudid for c/o pain.

## 2014-06-01 NOTE — Progress Notes (Signed)
NURSING PROGRESS NOTE  [name] [id number] Discharge Data: 06/01/2014 2:14 PM Attending Provider: Hosie Poisson, MD IRS:[email or web address], Gennie Alma to be D/C'd Home per MD order.  Discussed with the patient the After Visit Summary and all questions fully answered. All IV's discontinued with no bleeding noted. All belongings returned to patient for patient to take home.   Last Vital Signs:  Blood pressure 154/85, pulse 85, temperature 98.2 F (36.8 C), temperature source Oral, resp. rate 18, height 5\' 11"  (1.803 m), weight 145.469 kg (320 lb 11.2 oz), SpO2 96.00%.  Discharge Medication List   Medication List         amitriptyline 25 MG tablet  Commonly known as:  ELAVIL  Take 25 mg by mouth at bedtime.     amLODipine 10 MG tablet  Commonly known as:  NORVASC  Take 1 tablet (10 mg total) by mouth daily.     cloNIDine 0.1 MG tablet  Commonly known as:  CATAPRES  Take 0.1 mg by mouth 2 (two) times daily.     esomeprazole 40 MG capsule  Commonly known as:  NEXIUM  Take 40 mg by mouth 2 (two) times daily before a meal.     glipiZIDE 5 MG tablet  Commonly known as:  GLUCOTROL  Take 1.5 tablets (7.5 mg total) by mouth 2 (two) times daily before a meal.     hydrALAZINE 50 MG tablet  Commonly known as:  APRESOLINE  Take 1 tablet (50 mg total) by mouth every 8 (eight) hours.     lisinopril 40 MG tablet  Commonly known as:  PRINIVIL,ZESTRIL  Take 40 mg by mouth daily.     metoprolol succinate 100 MG 24 hr tablet  Commonly known as:  TOPROL-XL  Take 100 mg by mouth daily. Take with or immediately following a meal.     oxyCODONE-acetaminophen 5-325 MG per tablet  Commonly known as:  PERCOCET/ROXICET  Take 1 tablet by mouth every 4 (four) hours as needed for severe pain.     promethazine 25 MG tablet  Commonly known as:  PHENERGAN  Take 25 mg by mouth every 6 (six) hours as needed for nausea or vomiting.     sitaGLIPtin 100 MG tablet  Commonly known as:   JANUVIA  Take 100 mg by mouth daily.

## 2014-06-06 NOTE — Discharge Summary (Addendum)
Physician Discharge Summary  [name] WUJ:[id number] DOB: 06/05/1961 DOA: 05/27/2014  PCP: Marry Guan  Admit date: 05/27/2014 Discharge date: 06/01/2014  Time spent: 30 minutes  Recommendations for Outpatient Follow-up:  1. Follow up with PCP in one week.   Discharge Diagnoses:  Principal Problem:   Abdominal pain Active Problems:   Chest pain   Uncontrolled secondary diabetes with peripheral neuropathy   Dyslipidemia   HTN (hypertension)   Lactic acid acidosis   Discharge Condition: improved.   Diet recommendation: low sodium diet. Carb modified diet.   Filed Weights   05/27/14 1636 05/28/14 0300  Weight: 146.285 kg (322 lb 8 oz) 145.469 kg (320 lb 11.2 oz)    History of present illness:  [name] is a 53 y.o. male with a history of DM2, HTN, and PUD who presents to the ED with complaints of SOB, RUQ ABD Pain that radiates into his back that had sudden onset and lasted 20 -30 minutes tonight. He as found to have hepatic steatosis and elevated lactic acid levels. He was admitted to medical service for further recommendations. His lactic acidosis was probably secondary to dehydration and elevated blood sugars.    Hospital Course:  1. Abdominal pain: Probably from hepatic steatosis. Its resolved now. No nausea or vomiting. Liver fucntion panel within normal limits except for mildly elevated AST. Repeat liver panel in am the same.  2 . Uncontrolled DM:  HGBA1C IS 9.2. Resume home medications and SSI. Started on lantus, later on changed to novolog 70/30 as lantus is expensive. He is adamantly refusing to be on insulin at home. His lactic acid is still elevated and he appears to be in mild DKA WITH gap of 17 to 18. i have added on a little meal coverage and increased the novlog 70/30 and resumed glipizide and januvia. We finally transitioned to oral anti diabetic medications. He absolutely refused to be on insulin.  CBG (last 3)   Recent Labs   05/31/14 0800   05/31/14 1221  05/31/14 1726   GLUCAP  219*  214*  283*    3. Atypical chest pain: on the right side.  Tender to touch. - point of care troponin negative. EKG sinus .  - repeat EKG show non specific t wave changes. Echocardiogram ordered showed grade 1 diastolic dysfunction. Outpatient follow up .  4. Lactic acidosis:  Probably from dehydration.  Repeat level in am show showed improvement. . CT angiogram of the abdomen is negative.  5. Accelerated hypertension:  - resume home medications. - start amlodipine.  6. Dyslipidemia:  - resume statin.   Procedures:  none  Consultations:  none  Discharge Exam: Filed Vitals:   06/01/14 1300  BP: 154/85  Pulse: 85  Temp: 98.2 F (36.8 C)  Resp: 18    General: alert afebrile comfortable. Cardiovascular: s1s2 Respiratory: ctab.no wheezing heard.   Discharge Instructions You were cared for by a hospitalist during your hospital stay. If you have any questions about your discharge medications or the care you received while you were in the hospital after you are discharged, you can call the unit and asked to speak with the hospitalist on call if the hospitalist that took care of you is not available. Once you are discharged, your primary care physician will handle any further medical issues. Please note that NO REFILLS for any discharge medications will be authorized once you are discharged, as it is imperative that you return to your primary care physician (or establish  a relationship with a primary care physician if you do not have one) for your aftercare needs so that they can reassess your need for medications and monitor your lab values.  Discharge Instructions   Diet - low sodium heart healthy    Complete by:  As directed      Diet - low sodium heart healthy    Complete by:  As directed      Discharge instructions    Complete by:  As directed   FOLLOW UP with PCP in one week.     Discharge instructions    Complete by:  As  directed   Follow up with PCP in one week.            Medication List         amitriptyline 25 MG tablet  Commonly known as:  ELAVIL  Take 25 mg by mouth at bedtime.     amLODipine 10 MG tablet  Commonly known as:  NORVASC  Take 1 tablet (10 mg total) by mouth daily.     cloNIDine 0.1 MG tablet  Commonly known as:  CATAPRES  Take 0.1 mg by mouth 2 (two) times daily.     esomeprazole 40 MG capsule  Commonly known as:  NEXIUM  Take 40 mg by mouth 2 (two) times daily before a meal.     glipiZIDE 5 MG tablet  Commonly known as:  GLUCOTROL  Take 1.5 tablets (7.5 mg total) by mouth 2 (two) times daily before a meal.     hydrALAZINE 50 MG tablet  Commonly known as:  APRESOLINE  Take 1 tablet (50 mg total) by mouth every 8 (eight) hours.     lisinopril 40 MG tablet  Commonly known as:  PRINIVIL,ZESTRIL  Take 40 mg by mouth daily.     metoprolol succinate 100 MG 24 hr tablet  Commonly known as:  TOPROL-XL  Take 100 mg by mouth daily. Take with or immediately following a meal.     oxyCODONE-acetaminophen 5-325 MG per tablet  Commonly known as:  PERCOCET/ROXICET  Take 1 tablet by mouth every 4 (four) hours as needed for severe pain.     promethazine 25 MG tablet  Commonly known as:  PHENERGAN  Take 25 mg by mouth every 6 (six) hours as needed for nausea or vomiting.     sitaGLIPtin 100 MG tablet  Commonly known as:  JANUVIA  Take 100 mg by mouth daily.       Allergies  Allergen Reactions  . Ibuprofen     Made gastric ulcers worse       Follow-up Information   Follow up with Marry Guan. Schedule an appointment as soon as possible for a visit in 1 week.   Specialty:  Family Medicine   Contact information:   [address] [facility] [place] [address] [phone]        The results of significant diagnostics from this hospitalization (including imaging, microbiology, ancillary and laboratory) are listed below  for reference.    Significant Diagnostic Studies: Dg Chest 2 View (if Patient Has Fever And/or Copd)  05/27/2014   CLINICAL DATA:  Shortness of breath, chest pain for 1 week  EXAM: CHEST  2 VIEW  COMPARISON:  Chest x-ray of 03/14/2014  FINDINGS: No focal infiltrate or effusion is seen. Mediastinal hilar contours are stable as is cardiomegaly. There are degenerative changes throughout the thoracic spine.  IMPRESSION: No definite active process.  Stable cardiomegaly   Electronically Signed   By: Ivar Drape M.D.   On: 05/27/2014 16:59   Ct Cta Abd/pel W/cm &/or W/o Cm  05/28/2014   CLINICAL DATA:  Back and abdominal pain  EXAM: CTA ABDOMEN AND PELVIS WITH CONTRAST  TECHNIQUE: Multidetector CT imaging of the abdomen and pelvis was performed using the standard protocol during bolus administration of intravenous contrast. Multiplanar reconstructed images and MIPs were obtained and reviewed to evaluate the vascular anatomy.  CONTRAST:  166mL OMNIPAQUE IOHEXOL 350 MG/ML SOLN  COMPARISON:  03/02/2014  FINDINGS: BODY WALL: Unremarkable.  LOWER CHEST: Calcified granulomas in the right lower lobe.  ABDOMEN/PELVIS:  Liver: Marked in diffuse steatotic infiltration. There are enlarged lymph nodes within the deep liver drainage which are chronic and usually from chronic hepatocellular disease.  Biliary: No evidence of biliary obstruction or stone.  Pancreas: Unremarkable.  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 This largest lesion has been present without significant change from 2005 and is benign - possibly a remote infarct.  Adrenals: Unremarkable.  Kidneys and ureters: No hydronephrosis or stone.  Bladder: Unremarkable.  Reproductive: Unremarkable.  Bowel: No obstruction. Normal appendix.  Retroperitoneum: No mass or adenopathy.  Peritoneum: No ascites or pneumoperitoneum.  Vascular: Standard aortic branching pattern. No  major vessel stenosis or occlusion. No significant atherosclerotic plaque deposition.  OSSEOUS: Sclerotic focus in the left iliac wing is stable from 2005 and benign.  Review of the MIP images confirms the above findings.  IMPRESSION: 1. No aortic dissection or other acute intra-abdominal abnormality. 2. Marked hepatic steatosis. 3. Other chronic findings are stable from prior and noted above.   Electronically Signed   By: Jorje Guild M.D.   On: 05/28/2014 01:43   US Abdomen Limited Ruq  05/27/2014   CLINICAL DATA:  Right upper quadrant pain.  EXAM: US ABDOMEN LIMITED - RIGHT UPPER QUADRANT  COMPARISON:  None.  FINDINGS: Gallbladder:  No gallstones or wall thickening visualized. No sonographic Murphy sign noted.  Common bile duct:  Diameter: 5 mm  Liver:  No focal lesion.  Increased in echogenicity.  IMPRESSION: Hepatic steatosis.  No cholelithiasis or sonographic evidence for acute cholecystitis.   Electronically Signed   By: Lovey Newcomer M.D.   On: 05/27/2014 21:23    Microbiology: No results found for this or any previous visit (from the past 240 hour(s)).   Labs: Basic Metabolic Panel:  Recent Labs Lab 05/31/14 1425 06/01/14 0910  NA 136* 140  K 3.4* 3.3*  CL 95* 100  CO2 23 24  GLUCOSE 229* 209*  BUN 8 9  CREATININE 0.51 0.53  CALCIUM 9.1 9.3   Liver Function Tests: No results found for this basename: AST, ALT, ALKPHOS, BILITOT, PROT, ALBUMIN,  in the last 168 hours No results found for this basename: LIPASE, AMYLASE,  in the last 168 hours No results found for this basename: AMMONIA,  in the last 168 hours CBC: No results found for this basename: WBC, NEUTROABS, HGB, HCT, MCV, PLT,  in the last 168 hours Cardiac Enzymes: No results found for this basename: CKTOTAL, CKMB, CKMBINDEX, TROPONINI,  in the last 168 hours BNP: BNP (last 3 results) No results found for this basename: PROBNP,  in the last 8760 hours CBG:  Recent Labs Lab 05/31/14 1221 05/31/14 1726  05/31/14 2132 06/01/14 0759 06/01/14 1211  GLUCAP 214* 283* 198* 174* 182*       Signed:  Harmon Bommarito  [facility] 06/06/2014, 8:13 PM

## 2014-10-02 ENCOUNTER — Encounter (HOSPITAL_COMMUNITY): Payer: Self-pay | Admitting: Internal Medicine

## 2014-12-21 ENCOUNTER — Emergency Department (HOSPITAL_COMMUNITY)
Admission: EM | Admit: 2014-12-21 | Discharge: 2014-12-22 | Disposition: A | Discharge status: Home / Self Care | Payer: No Typology Code available for payment source | Attending: Emergency Medicine | Admitting: Emergency Medicine

## 2014-12-21 ENCOUNTER — Encounter (HOSPITAL_COMMUNITY): Payer: Self-pay | Admitting: Emergency Medicine

## 2014-12-21 ENCOUNTER — Emergency Department (HOSPITAL_COMMUNITY): Payer: No Typology Code available for payment source

## 2014-12-21 DIAGNOSIS — R42 Dizziness and giddiness: Secondary | ICD-10-CM | POA: Insufficient documentation

## 2014-12-21 DIAGNOSIS — Z8711 Personal history of peptic ulcer disease: Secondary | ICD-10-CM | POA: Insufficient documentation

## 2014-12-21 DIAGNOSIS — E119 Type 2 diabetes mellitus without complications: Secondary | ICD-10-CM | POA: Insufficient documentation

## 2014-12-21 DIAGNOSIS — R5383 Other fatigue: Secondary | ICD-10-CM | POA: Insufficient documentation

## 2014-12-21 DIAGNOSIS — I10 Essential (primary) hypertension: Secondary | ICD-10-CM | POA: Insufficient documentation

## 2014-12-21 DIAGNOSIS — R079 Chest pain, unspecified: Secondary | ICD-10-CM | POA: Insufficient documentation

## 2014-12-21 DIAGNOSIS — Z8719 Personal history of other diseases of the digestive system: Secondary | ICD-10-CM | POA: Insufficient documentation

## 2014-12-21 DIAGNOSIS — R110 Nausea: Secondary | ICD-10-CM | POA: Insufficient documentation

## 2014-12-21 DIAGNOSIS — Z79899 Other long term (current) drug therapy: Secondary | ICD-10-CM | POA: Insufficient documentation

## 2014-12-21 LAB — BASIC METABOLIC PANEL
Anion gap: 11 (ref 5–15)
BUN: 11 mg/dL (ref 6–23)
CO2: 25 mmol/L (ref 19–32)
Calcium: 9.1 mg/dL (ref 8.4–10.5)
Chloride: 99 mmol/L (ref 96–112)
Creatinine, Ser: 0.9 mg/dL (ref 0.50–1.35)
GFR calc Af Amer: >90 mL/min (ref >90)
GFR calc non Af Amer: >90 mL/min (ref >90)
Glucose, Bld: 276 mg/dL — ABNORMAL HIGH (ref 70–99)
Potassium: 3.5 mmol/L (ref 3.5–5.1)
Sodium: 135 mmol/L (ref 135–145)

## 2014-12-21 LAB — CBC
HCT: 37.7 % — ABNORMAL LOW (ref 39.0–52.0)
Hemoglobin: 12.6 g/dL — ABNORMAL LOW (ref 13.0–17.0)
MCH: 30.9 pg (ref 26.0–34.0)
MCHC: 33.4 g/dL (ref 30.0–36.0)
MCV: 92.4 fL (ref 78.0–100.0)
Platelets: 213 10*3/uL (ref 150–400)
RBC: 4.08 MIL/uL — ABNORMAL LOW (ref 4.22–5.81)
RDW: 13.9 % (ref 11.5–15.5)
WBC: 8.2 10*3/uL (ref 4.0–10.5)

## 2014-12-21 LAB — I-STAT TROPONIN, ED: Troponin i, poc: 0 ng/mL (ref 0.00–0.08)

## 2014-12-21 NOTE — ED Notes (Signed)
Patient c/o sharp CP x1 hour, nausea/vomiting earlier today, fatigue, diaphoresis, tingling in bilateral feet, denies SOB.

## 2014-12-21 NOTE — ED Notes (Addendum)
Pt states that tonight he had a sharp chest pain and got tired and now has a headache. Hx of same. Cardiac cath in April of last year w/ stent placement. Alert and oriented.

## 2014-12-21 NOTE — ED Provider Notes (Signed)
CSN: [id number]     Arrival date & time 12/21/14  2124 History   First MD Initiated Contact with Patient 12/21/14 2159     Chief Complaint  Patient presents with  . Chest Pain   HPI  Patient is a 54 year old male with past medical history of diabetes, hypertension, and IBS who presents emergency room for evaluation of chest pain. Patient states that approximately an hour prior to coming in he developed a sharp stabbing substernal and left-sided chest pain that is currently getting better. It is associated with severe fatigue, some nausea, and a feeling of being hot. Patient states that the chest pain is getting better and his nausea is gone because he took promethazine at home. He still feels very fatigued. He states that his been under a lot of stress as he has been taking care of his mother lately. He does say that his blood sugars have been out of control. Patient had a left heart catheterization performed in 2015 with 50% blockage in the LAD which Dr. Debara Pickett felt was not related to anginal chest pain. Patient had adequate flow.  Past Medical History  Diagnosis Date  . Type 2 diabetes mellitus   . Hypertension   . Peptic ulcer   . Back pain    Past Surgical History  Procedure Laterality Date  . Coronary stent placement    . Left heart catheterization with coronary angiogram N/A 02/03/2014    Procedure: LEFT HEART CATHETERIZATION WITH CORONARY ANGIOGRAM;  Surgeon: Pixie Casino, MD;  Location: [facility] CATH LAB;  Service: Cardiovascular;  Laterality: N/A;   Family History  Problem Relation Age of Onset  . Hypertension Mother   . Diabetes Mother   . Cancer Father   . Hypertension Brother   . Hypertension Sister    History  Substance Use Topics  . Smoking status: Never Smoker   . Smokeless tobacco: Never Used  . Alcohol Use: No    Review of Systems  Constitutional: Negative for fever, chills and fatigue.  Respiratory: Negative for cough, chest tightness, shortness of breath and  wheezing.   Cardiovascular: Positive for chest pain. Negative for palpitations and leg swelling.  Gastrointestinal: Positive for nausea. Negative for vomiting, abdominal pain, diarrhea, constipation, blood in stool and anal bleeding.  Genitourinary: Negative for dysuria, urgency, frequency, hematuria and difficulty urinating.  Musculoskeletal: Negative for back pain and arthralgias.  Skin: Negative for rash.  Neurological: Positive for dizziness. Negative for weakness, numbness and headaches.  All other systems reviewed and are negative.     Allergies  Ibuprofen  Home Medications   Prior to Admission medications   Medication Sig Start Date End Date Taking? Authorizing Provider  amLODipine (NORVASC) 10 MG tablet Take 1 tablet (10 mg total) by mouth daily. 06/01/14  Yes Hosie Poisson, MD  cloNIDine (CATAPRES) 0.1 MG tablet Take 0.1 mg by mouth 2 (two) times daily.   Yes Historical Provider, MD  esomeprazole (NEXIUM) 40 MG capsule Take 40 mg by mouth 2 (two) times daily before a meal.   Yes Historical Provider, MD  furosemide (LASIX) 40 MG tablet Take 40 mg by mouth daily.   Yes Historical Provider, MD  gabapentin (NEURONTIN) 300 MG capsule Take 300 mg by mouth 2 (two) times daily.   Yes Historical Provider, MD  glipiZIDE (GLUCOTROL) 5 MG tablet Take 1.5 tablets (7.5 mg total) by mouth 2 (two) times daily before a meal. Patient taking differently: Take 10 mg by mouth daily before breakfast.  06/01/14  Yes Hosie Poisson, MD  hydrALAZINE (APRESOLINE) 50 MG tablet Take 1 tablet (50 mg total) by mouth every 8 (eight) hours. Patient taking differently: Take 100 mg by mouth 2 (two) times daily.  05/29/14  Yes Hosie Poisson, MD  lisinopril (PRINIVIL,ZESTRIL) 40 MG tablet Take 40 mg by mouth daily.   Yes Historical Provider, MD  metoprolol (LOPRESSOR) 50 MG tablet Take 50 mg by mouth 2 (two) times daily.   Yes Historical Provider, MD  oxyCODONE-acetaminophen (PERCOCET/ROXICET) 5-325 MG per tablet Take 1  tablet by mouth every 4 (four) hours as needed for severe pain. 06/01/14  Yes Hosie Poisson, MD  promethazine (PHENERGAN) 25 MG tablet Take 25 mg by mouth every 6 (six) hours as needed for nausea or vomiting.   Yes Historical Provider, MD  sertraline (ZOLOFT) 50 MG tablet Take 50 mg by mouth daily.   Yes Historical Provider, MD  sitaGLIPtin (JANUVIA) 100 MG tablet Take 100 mg by mouth daily.   Yes Historical Provider, MD   BP 156/82 mmHg  Pulse 78  Temp(Src) 98.1 F (36.7 C) (Oral)  Resp 10  Ht 5\' 10"  (1.778 m)  Wt 315 lb (142.883 kg)  BMI 45.20 kg/m2  SpO2 97% Physical Exam  Constitutional: He is oriented to person, place, and time. He appears well-developed and well-nourished. No distress.  HENT:  Head: Normocephalic and atraumatic.  Mouth/Throat: Oropharynx is clear and moist. No oropharyngeal exudate.  Eyes: Conjunctivae and EOM are normal. Pupils are equal, round, and reactive to light. No scleral icterus.  Neck: Normal range of motion. Neck supple. No JVD present. No thyromegaly present.  Cardiovascular: Normal rate, regular rhythm and intact distal pulses.  Exam reveals no gallop and no friction rub.   No murmur heard. Pulmonary/Chest: Effort normal and breath sounds normal. No respiratory distress. He has no wheezes. He has no rales. He exhibits no tenderness.  Abdominal: Soft. Bowel sounds are normal. He exhibits no distension and no mass. There is no tenderness. There is no rebound and no guarding.  Musculoskeletal: Normal range of motion.  Lymphadenopathy:    He has no cervical adenopathy.  Neurological: He is alert and oriented to person, place, and time. He has normal strength. No cranial nerve deficit or sensory deficit. Coordination normal.  Skin: Skin is warm and dry. He is not diaphoretic.  Psychiatric: He has a normal mood and affect. His behavior is normal. Judgment and thought content normal.  Nursing note and vitals reviewed.   ED Course  Procedures (including  critical care time) Labs Review Labs Reviewed  CBC - Abnormal; Notable for the following:    RBC 4.08 (*)    Hemoglobin 12.6 (*)    HCT 37.7 (*)    All other components within normal limits  BASIC METABOLIC PANEL - Abnormal; Notable for the following:    Glucose, Bld 276 (*)    All other components within normal limits  I-STAT TROPOININ, ED    Imaging Review Dg Chest 2 View  12/21/2014   CLINICAL DATA:  Extreme stabbing LEFT chest pain for 45 minutes, or LEFT arm pain. History of hypertension, diabetes and back pain.  EXAM: CHEST  2 VIEW  COMPARISON:  Chest radiograph September 23, 2014  FINDINGS: Cardiac silhouette is upper limits of normal, mediastinal silhouette is unremarkable. The lungs are clear without pleural effusions or focal consolidations. Trachea projects midline and there is no pneumothorax. Soft tissue planes and included osseous structures are non-suspicious. Moderate degenerative change of the thoracic spine.  IMPRESSION: Borderline cardiomegaly, no acute pulmonary process.   Electronically Signed   By: Elon Alas   On: 12/21/2014 22:52     EKG Interpretation   Date/Time:  Sunday December 21 2014 21:33:45 EST Ventricular Rate:  80 PR Interval:  175 QRS Duration: 80 QT Interval:  372 QTC Calculation: 429 R Axis:   74 Text Interpretation:  Sinus rhythm Nonspecific T abnormalities, lateral  leads Confirmed by Lacinda Axon  MD, BRIAN ([id number]) on 12/21/2014 11:23:47 PM      MDM   Final diagnoses:  Chest pain, unspecified chest pain type  Other fatigue    Patient is a 54 year old male who presents emergency room for evaluation of chest pain and fatigue. On initial evaluation patient is alert and nontoxic-appearing. There are no focal neurological deficits. CBC, and BMP are at baseline. I-STAT troponins negative. Chest x-ray reveals mild cardiomegaly. Chart review reveals a cardiac cath done in 2015 with mild blockage of the LAD which was not thought to be contributing  to chest pain. Patient was seen to have adequate flow at that time. EKG reveals nonspecific T-wave abnormalities. Delta troponin is scheduled for 1:42 AM. If delta troponin is negative patient can likely be sent home. Story is not super concerning. HEART score is 3-4.  Feel given recently performed catheterization that this is not likely ACS.  Patient seen by and discussed with Dr. Lacinda Axon who agrees with the above workup and plan.  Patient to be signed out with Charlann Lange PA-C.  Patient currently sleeping and pain free.  Patient to be discharged home if delta trop is negative.  Patient to follow-up with Dr. Debara Pickett and his PCP for further management.      Cherylann Parr, PA-C 12/22/14 [id number]  Nat Christen, MD 12/25/14 [phone]

## 2014-12-22 LAB — I-STAT TROPONIN, ED: Troponin i, poc: 0 ng/mL (ref 0.00–0.08)

## 2014-12-22 NOTE — Discharge Instructions (Signed)
Chest Pain (Nonspecific) °It is often hard to give a specific diagnosis for the cause of chest pain. There is always a chance that your pain could be related to something serious, such as a heart attack or a blood clot in the lungs. You need to follow up with your health care provider for further evaluation. °CAUSES  °· Heartburn. °· Pneumonia or bronchitis. °· Anxiety or stress. °· Inflammation around your heart (pericarditis) or lung (pleuritis or pleurisy). °· A blood clot in the lung. °· A collapsed lung (pneumothorax). It can develop suddenly on its own (spontaneous pneumothorax) or from trauma to the chest. °· Shingles infection (herpes zoster virus). °The chest wall is composed of bones, muscles, and cartilage. Any of these can be the source of the pain. °· The bones can be bruised by injury. °· The muscles or cartilage can be strained by coughing or overwork. °· The cartilage can be affected by inflammation and become sore (costochondritis). °DIAGNOSIS  °Lab tests or other studies may be needed to find the cause of your pain. Your health care provider may have you take a test called an ambulatory electrocardiogram (ECG). An ECG records your heartbeat patterns over a 24-hour period. You may also have other tests, such as: °· Transthoracic echocardiogram (TTE). During echocardiography, sound waves are used to evaluate how blood flows through your heart. °· Transesophageal echocardiogram (TEE). °· Cardiac monitoring. This allows your health care provider to monitor your heart rate and rhythm in real time. °· Holter monitor. This is a portable device that records your heartbeat and can help diagnose heart arrhythmias. It allows your health care provider to track your heart activity for several days, if needed. °· Stress tests by exercise or by giving medicine that makes the heart beat faster. °TREATMENT  °· Treatment depends on what may be causing your chest pain. Treatment may include: °· Acid blockers for  heartburn. °· Anti-inflammatory medicine. °· Pain medicine for inflammatory conditions. °· Antibiotics if an infection is present. °· You may be advised to change lifestyle habits. This includes stopping smoking and avoiding alcohol, caffeine, and chocolate. °· You may be advised to keep your head raised (elevated) when sleeping. This reduces the chance of acid going backward from your stomach into your esophagus. °Most of the time, nonspecific chest pain will improve within 2-3 days with rest and mild pain medicine.  °HOME CARE INSTRUCTIONS  °· If antibiotics were prescribed, take them as directed. Finish them even if you start to feel better. °· For the next few days, avoid physical activities that bring on chest pain. Continue physical activities as directed. °· Do not use any tobacco products, including cigarettes, chewing tobacco, or electronic cigarettes. °· Avoid drinking alcohol. °· Only take medicine as directed by your health care provider. °· Follow your health care provider's suggestions for further testing if your chest pain does not go away. °· Keep any follow-up appointments you made. If you do not go to an appointment, you could develop lasting (chronic) problems with pain. If there is any problem keeping an appointment, call to reschedule. °SEEK MEDICAL CARE IF:  °· Your chest pain does not go away, even after treatment. °· You have a rash with blisters on your chest. °· You have a fever. °SEEK IMMEDIATE MEDICAL CARE IF:  °· You have increased chest pain or pain that spreads to your arm, neck, jaw, back, or abdomen. °· You have shortness of breath. °· You have an increasing cough, or you cough   up blood. °· You have severe back or abdominal pain. °· You feel nauseous or vomit. °· You have severe weakness. °· You faint. °· You have chills. °This is an emergency. Do not wait to see if the pain will go away. Get medical help at once. Call your local emergency services (911 in [place] Do not drive  yourself to the hospital. °MAKE SURE YOU:  °· Understand these instructions. °· Will watch your condition. °· Will get help right away if you are not doing well or get worse. °Document Released: 07/20/2005 Document Revised: 10/15/2013 Document Reviewed: 05/15/2008 °ExitCare® Patient Information ©2015 [employer]. This information is not intended to replace advice given to you by your health care provider. Make sure you discuss any questions you have with your health care provider. °Fatigue °Fatigue is a feeling of tiredness, lack of energy, lack of motivation, or feeling tired all the time. Having enough rest, good nutrition, and reducing stress will normally reduce fatigue. Consult your caregiver if it persists. The nature of your fatigue will help your caregiver to find out its cause. The treatment is based on the cause.  °CAUSES  °There are many causes for fatigue. Most of the time, fatigue can be traced to one or more of your habits or routines. Most causes fit into one or more of three general areas. They are: °Lifestyle problems °· Sleep disturbances. °· Overwork. °· Physical exertion. °· Unhealthy habits. °¨ Poor eating habits or eating disorders. °¨ Alcohol and/or drug use . °¨ Lack of proper nutrition (malnutrition). °Psychological problems °· Stress and/or anxiety problems. °· Depression. °· Grief. °· Boredom. °Medical Problems or Conditions °· Anemia. °· Pregnancy. °· Thyroid gland problems. °· Recovery from major surgery. °· Continuous pain. °· Emphysema or asthma that is not well controlled °· Allergic conditions. °· Diabetes. °· Infections (such as mononucleosis). °· Obesity. °· Sleep disorders, such as sleep apnea. °· Heart failure or other heart-related problems. °· Cancer. °· Kidney disease. °· Liver disease. °· Effects of certain medicines such as antihistamines, cough and cold remedies, prescription pain medicines, heart and blood pressure medicines, drugs used for treatment of cancer, and some  antidepressants. °SYMPTOMS  °The symptoms of fatigue include:  °· Lack of energy. °· Lack of drive (motivation). °· Drowsiness. °· Feeling of indifference to the surroundings. °DIAGNOSIS  °The details of how you feel help guide your caregiver in finding out what is causing the fatigue. You will be asked about your present and past health condition. It is important to review all medicines that you take, including prescription and non-prescription items. A thorough exam will be done. You will be questioned about your feelings, habits, and normal lifestyle. Your caregiver may suggest blood tests, urine tests, or other tests to look for common medical causes of fatigue.  °TREATMENT  °Fatigue is treated by correcting the underlying cause. For example, if you have continuous pain or depression, treating these causes will improve how you feel. Similarly, adjusting the dose of certain medicines will help in reducing fatigue.  °HOME CARE INSTRUCTIONS  °· Try to get the required amount of good sleep every night. °· Eat a healthy and nutritious diet, and drink enough water throughout the day. °· Practice ways of relaxing (including yoga or meditation). °· Exercise regularly. °· Make plans to change situations that cause stress. Act on those plans so that stresses decrease over time. Keep your work and personal routine reasonable. °· Avoid street drugs and minimize use of alcohol. °· Start taking   a daily multivitamin after consulting your caregiver. °SEEK MEDICAL CARE IF:  °· You have persistent tiredness, which cannot be accounted for. °· You have fever. °· You have unintentional weight loss. °· You have headaches. °· You have disturbed sleep throughout the night. °· You are feeling sad. °· You have constipation. °· You have dry skin. °· You have gained weight. °· You are taking any new or different medicines that you suspect are causing fatigue. °· You are unable to sleep at night. °· You develop any unusual swelling of your  legs or other parts of your body. °SEEK IMMEDIATE MEDICAL CARE IF:  °· You are feeling confused. °· Your vision is blurred. °· You feel faint or pass out. °· You develop severe headache. °· You develop severe abdominal, pelvic, or back pain. °· You develop chest pain, shortness of breath, or an irregular or fast heartbeat. °· You are unable to pass a normal amount of urine. °· You develop abnormal bleeding such as bleeding from the rectum or you vomit blood. °· You have thoughts about harming yourself or committing suicide. °· You are worried that you might harm someone else. °MAKE SURE YOU:  °· Understand these instructions. °· Will watch your condition. °· Will get help right away if you are not doing well or get worse. °Document Released: 08/07/2007 Document Revised: 01/02/2012 Document Reviewed: 02/11/2014 °ExitCare® Patient Information ©2015 [employer]. This information is not intended to replace advice given to you by your health care provider. Make sure you discuss any questions you have with your health care provider. ° °

## 2014-12-22 NOTE — ED Provider Notes (Signed)
Left CP, biliary colic (chronic) 45 minutes, sharp stabbing, fatigue Cath in 2015, small LAD lesion - medical mgmt Normal labs, CXR normal x mild cardiomegaly  Pending delta trop - 1:40  Delta troponin negative. Patient discharged to follow up with cardiology.  Dewaine Oats, PA-C 12/26/14 [place], MD 01/03/15 [phone]

## 2015-01-29 ENCOUNTER — Ambulatory Visit: Payer: No Typology Code available for payment source

## 2016-01-06 ENCOUNTER — Ambulatory Visit (INDEPENDENT_AMBULATORY_CARE_PROVIDER_SITE_OTHER): Payer: No Typology Code available for payment source | Admitting: Cardiology

## 2016-01-06 ENCOUNTER — Encounter: Payer: Self-pay | Admitting: Cardiology

## 2016-01-06 DIAGNOSIS — I82409 Acute embolism and thrombosis of unspecified deep veins of unspecified lower extremity: Secondary | ICD-10-CM | POA: Insufficient documentation

## 2016-01-06 DIAGNOSIS — R0789 Other chest pain: Secondary | ICD-10-CM

## 2016-01-06 DIAGNOSIS — I2510 Atherosclerotic heart disease of native coronary artery without angina pectoris: Secondary | ICD-10-CM | POA: Insufficient documentation

## 2016-01-06 HISTORY — DX: Acute embolism and thrombosis of unspecified deep veins of unspecified lower extremity: I82.409

## 2016-01-06 MED ORDER — METOPROLOL TARTRATE 50 MG PO TABS: 75.0000 mg | ORAL_TABLET | Freq: Two times a day (BID) | ORAL | Status: DC

## 2016-01-06 MED ORDER — ATORVASTATIN CALCIUM 80 MG PO TABS: 80.0000 mg | ORAL_TABLET | Freq: Every day | ORAL | Status: DC

## 2016-01-06 NOTE — Progress Notes (Signed)
HPI: FU CP. Previously followed by Dr Debara Pickett. Nuclear 11/2013 in [facility] showed normal perfusion and ejection fraction 47%. Cardiac catheterization April 2015 showed a 50% stenosis at the LAD first diagonal bifurcation but no other significant disease noted. Echocardiogram August 2015 showed normal LV systolic function and grade 1 diastolic dysfunction. I do not have all records available. Patient was placed on anticoagulation approximately 5 months ago for a DVT. He was admitted to [facility] recently with hematochezia and chest pain. He apparently had a stress test which was unremarkable. The patient has dyspnea on exertion but no orthopnea or PND. He has occasional pain in his left chest that is described as a cramping sensation and sharp. It occurs with exertion and at rest. Last several minutes and resolves. Similar to previous chest pain.  Current Outpatient Prescriptions  Medication Sig Dispense Refill  . amitriptyline (ELAVIL) 150 MG tablet Take 1 tablet by mouth at bedtime.    Marland Kitchen amLODipine (NORVASC) 10 MG tablet Take 1 tablet (10 mg total) by mouth daily. 30 tablet 1  . cloNIDine (CATAPRES) 0.1 MG tablet Take 0.1 mg by mouth 2 (two) times daily.    . furosemide (LASIX) 40 MG tablet Take 40 mg by mouth daily.    Marland Kitchen gabapentin (NEURONTIN) 600 MG tablet Take 1 tablet by mouth 2 (two) times daily.    Marland Kitchen glipiZIDE (GLUCOTROL) 10 MG tablet Take 1 tablet by mouth 2 (two) times daily.  5  . hydrALAZINE (APRESOLINE) 50 MG tablet Take 1 tablet (50 mg total) by mouth every 8 (eight) hours. (Patient taking differently: Take 100 mg by mouth 2 (two) times daily. ) 90 tablet 0  . lisinopril (PRINIVIL,ZESTRIL) 40 MG tablet Take 40 mg by mouth daily.    . metoprolol (LOPRESSOR) 50 MG tablet Take 50 mg by mouth 2 (two) times daily.    Marland Kitchen omeprazole (PRILOSEC) 20 MG capsule Take 20 mg by mouth daily.    Marland Kitchen oxyCODONE-acetaminophen (PERCOCET/ROXICET) 5-325 MG per tablet Take 1 tablet by mouth every 4  (four) hours as needed for severe pain. 10 tablet 0  . Potassium Chloride ER (K-TAB) 20 MEQ TBCR Take 1 tablet by mouth 2 (two) times daily.    . promethazine (PHENERGAN) 25 MG tablet Take 25 mg by mouth every 6 (six) hours as needed for nausea or vomiting.    . rivaroxaban (XARELTO) 20 MG TABS tablet Take 20 mg by mouth daily.    . sertraline (ZOLOFT) 50 MG tablet Take 50 mg by mouth daily.    . sitaGLIPtin (JANUVIA) 100 MG tablet Take 100 mg by mouth daily.     No current facility-administered medications for this visit.    Allergies  Allergen Reactions  . Ibuprofen     Made gastric ulcers worse     Past Medical History  Diagnosis Date  . Type 2 diabetes mellitus ([place])   . Hypertension   . Peptic ulcer   . Back pain   . CAD (coronary artery disease)     Past Surgical History  Procedure Laterality Date  . Left heart catheterization with coronary angiogram N/A 02/03/2014    Procedure: LEFT HEART CATHETERIZATION WITH CORONARY ANGIOGRAM;  Surgeon: Pixie Casino, MD;  Location: [facility] CATH LAB;  Service: Cardiovascular;  Laterality: N/A;    Social History   Social History  . Marital Status: Single    Spouse Name: N/A  . Number of Children: 3  . Years of Education: 69  Occupational History  . security    Social History Main Topics  . Smoking status: Never Smoker   . Smokeless tobacco: Never Used  . Alcohol Use: No  . Drug Use: No  . Sexual Activity: Not on file   Other Topics Concern  . Not on file   Social History Narrative    Family History  Problem Relation Age of Onset  . Hypertension Mother   . Diabetes Mother   . Cancer Father   . Hypertension Brother   . Hypertension Sister     ROS: no fevers or chills, productive cough, hemoptysis, dysphasia, odynophagia, melena, hematochezia, dysuria, hematuria, rash, seizure activity, orthopnea, PND, pedal edema, claudication. Remaining systems are negative.  Physical Exam:   Blood pressure 149/87, pulse 93,  height 5\' 10"  (1.778 m), weight 320 lb (145.151 kg).  General:  Well developed/obese in NAD Skin warm/dry Patient not depressed No peripheral clubbing Back-normal HEENT-normal/normal eyelids Neck supple/normal carotid upstroke bilaterally; no bruits; no JVD; no thyromegaly chest - CTA/ normal expansion CV - RRR/normal S1 and S2; no murmurs, rubs or gallops;  PMI nondisplaced Abdomen -NT/ND, no HSM, no mass, + bowel sounds, no bruit 2+ femoral pulses, no bruits Ext-no edema, chords, 2+ DP Neuro-grossly nonfocal  ECG Sinus rhythm at a rate of 93. Nonspecific ST changes.

## 2016-01-06 NOTE — Assessment & Plan Note (Signed)
Blood pressure is elevated. Increase metoprolol to 75 mg twice a day and follow.

## 2016-01-06 NOTE — Patient Instructions (Signed)
Medication Instructions:   INCREASE METOPROLOL TO 75 MG TWICE DAILY= 1 AND 1/2 OF 50 MG TABLET TWICE DAILY  START ATORVASWTATIN 80 MG ONCE DAILY  Labwork:  Your physician recommends that you return for lab work in: 4 WEEKS= DO NOT EAT PRIOR TO LAB WORK  Follow-Up:  Your physician wants you to follow-up in: [place] will receive a reminder letter in the mail two months in advance. If you don't receive a letter, please call our office to schedule the follow-up appointment.

## 2016-01-06 NOTE — Assessment & Plan Note (Signed)
Plan to add Lipitor 80 mg daily given history of moderate coronary disease. Check lipids and liver in 4 weeks.

## 2016-01-06 NOTE — Assessment & Plan Note (Signed)
Management per primary care. 

## 2016-01-06 NOTE — Assessment & Plan Note (Signed)
Discussed weight loss. 

## 2016-01-06 NOTE — Assessment & Plan Note (Signed)
Symptoms are somewhat atypical. He did have a catheterization approximately 2 years ago that showed nonobstructive disease. He apparently had a recent stress test at [facility]. I will obtain the results and review. If normal I would suggest observation at this point.

## 2016-01-06 NOTE — Assessment & Plan Note (Signed)
Patient not on aspirin at present given need for anticoagulation for DVT. Add statin.

## 2016-01-08 ENCOUNTER — Telehealth: Payer: Self-pay | Admitting: Cardiology

## 2016-01-08 NOTE — Telephone Encounter (Signed)
Received records from [facility] as requested by Dr Crenshaw.  Records given to Dr Crenshaw to review. lp °

## 2016-01-12 ENCOUNTER — Telehealth: Payer: Self-pay | Admitting: *Deleted

## 2016-01-12 DIAGNOSIS — R072 Precordial pain: Secondary | ICD-10-CM

## 2016-01-12 NOTE — Telephone Encounter (Signed)
Records from [facility] reviewed by dr Stanford Breed, no stress testing done during recent hospitalization. Per dr Stanford Breed, the pt needs to be scheduled for lexiscan myoview. Left message for pt to call

## 2016-01-13 ENCOUNTER — Encounter: Payer: Self-pay | Admitting: Cardiology

## 2016-01-13 NOTE — Telephone Encounter (Signed)
Follow up    Pt is returning rn call

## 2016-01-13 NOTE — Telephone Encounter (Signed)
This encounter was created in error - please disregard.

## 2016-01-13 NOTE — Telephone Encounter (Signed)
Charlett Nose at 01/13/2016 8:41 AM     Status: Signed       Expand All Collapse All   Follow up    Pt is returning rn call        Reviewed lexiscan instructions with patient. He understands he will have a 2 day test and scheduling will call him with appointment time.  He understands to hold diabetic medications until he can eat and to hold diuretics until after test.  Instructed patient to call back with any further questions.  Patient agrees with treatment plan.

## 2016-01-28 ENCOUNTER — Telehealth (HOSPITAL_COMMUNITY): Payer: Self-pay | Admitting: *Deleted

## 2016-01-28 NOTE — Telephone Encounter (Signed)
Left message on voicemail per DPR in reference to upcoming appointment scheduled on 02/02/16 with detailed instructions given per Myocardial Perfusion Study Information Sheet for the test. LM to arrive 15 minutes early, and that it is imperative to arrive on time for appointment to keep from having the test rescheduled. If you need to cancel or reschedule your appointment, please call the office within 24 hours of your appointment. Failure to do so may result in a cancellation of your appointment, and a $50 no show fee. Phone number given for call back for any questions. Hubbard Robinson, RN

## 2016-02-02 ENCOUNTER — Ambulatory Visit (HOSPITAL_COMMUNITY): Payer: No Typology Code available for payment source | Attending: Cardiology

## 2016-02-02 DIAGNOSIS — R0609 Other forms of dyspnea: Secondary | ICD-10-CM | POA: Insufficient documentation

## 2016-02-02 DIAGNOSIS — R072 Precordial pain: Secondary | ICD-10-CM | POA: Insufficient documentation

## 2016-02-02 DIAGNOSIS — I10 Essential (primary) hypertension: Secondary | ICD-10-CM | POA: Insufficient documentation

## 2016-02-02 DIAGNOSIS — E119 Type 2 diabetes mellitus without complications: Secondary | ICD-10-CM | POA: Insufficient documentation

## 2016-02-02 LAB — HEPATIC FUNCTION PANEL
ALT: 22 U/L (ref 9–46)
AST: 19 U/L (ref 10–35)
Albumin: 4.2 g/dL (ref 3.6–5.1)
Alkaline Phosphatase: 44 U/L (ref 40–115)
Bilirubin, Direct: 0.1 mg/dL (ref <=0.2)
Indirect Bilirubin: 0.4 mg/dL (ref 0.2–1.2)
Total Bilirubin: 0.5 mg/dL (ref 0.2–1.2)
Total Protein: 7.4 g/dL (ref 6.1–8.1)

## 2016-02-02 LAB — LIPID PANEL
Cholesterol: 88 mg/dL — ABNORMAL LOW (ref 125–200)
HDL: 27 mg/dL — ABNORMAL LOW (ref >=40)
LDL Cholesterol: 5 mg/dL (ref <130)
Total CHOL/HDL Ratio: 3.3 Ratio (ref <=5.0)
Triglycerides: 281 mg/dL — ABNORMAL HIGH (ref <150)
VLDL: 56 mg/dL — ABNORMAL HIGH (ref <30)

## 2016-02-02 MED ORDER — TECHNETIUM TC 99M SESTAMIBI GENERIC - CARDIOLITE
33.0000 | Freq: Once | INTRAVENOUS | Status: AC | PRN
  Administered 2016-02-02: 33 via INTRAVENOUS

## 2016-02-03 ENCOUNTER — Ambulatory Visit (HOSPITAL_COMMUNITY): Payer: No Typology Code available for payment source | Attending: Cardiovascular Disease

## 2016-02-03 LAB — MYOCARDIAL PERFUSION IMAGING
LV dias vol: 145 mL (ref 62–150)
LV sys vol: 63 mL
Peak HR: 101 {beats}/min
RATE: 0.3
Rest HR: 85 {beats}/min
SDS: 3
SRS: 3
SSS: 6
TID: 0.93

## 2016-02-03 MED ORDER — REGADENOSON 0.4 MG/5ML IV SOLN
0.4000 mg | Freq: Once | INTRAVENOUS | Status: AC
  Administered 2016-02-03: 0.4 mg via INTRAVENOUS

## 2016-02-03 MED ORDER — TECHNETIUM TC 99M SESTAMIBI GENERIC - CARDIOLITE
31.5000 | Freq: Once | INTRAVENOUS | Status: AC | PRN
  Administered 2016-02-03: 32 via INTRAVENOUS

## 2016-03-18 ENCOUNTER — Telehealth: Payer: Self-pay | Admitting: Cardiology

## 2016-03-18 NOTE — Telephone Encounter (Signed)
Hello Patient stop by today for his records, I fax over to the [facility] office for them to release

## 2016-05-30 ENCOUNTER — Inpatient Hospital Stay (HOSPITAL_COMMUNITY)
Admission: EM | Admit: 2016-05-30 | Discharge: 2016-06-01 | DRG: 247 | Disposition: A | Discharge status: Home / Self Care | Payer: Medicaid Other | Attending: Family Medicine | Admitting: Family Medicine

## 2016-05-30 ENCOUNTER — Emergency Department (HOSPITAL_COMMUNITY): Payer: Medicaid Other

## 2016-05-30 ENCOUNTER — Encounter (HOSPITAL_COMMUNITY): Payer: Self-pay | Admitting: Emergency Medicine

## 2016-05-30 DIAGNOSIS — R0789 Other chest pain: Secondary | ICD-10-CM | POA: Diagnosis present

## 2016-05-30 DIAGNOSIS — I10 Essential (primary) hypertension: Secondary | ICD-10-CM | POA: Diagnosis present

## 2016-05-30 DIAGNOSIS — E1159 Type 2 diabetes mellitus with other circulatory complications: Secondary | ICD-10-CM | POA: Diagnosis present

## 2016-05-30 DIAGNOSIS — Z7902 Long term (current) use of antithrombotics/antiplatelets: Secondary | ICD-10-CM

## 2016-05-30 DIAGNOSIS — D649 Anemia, unspecified: Secondary | ICD-10-CM | POA: Diagnosis present

## 2016-05-30 DIAGNOSIS — Z86718 Personal history of other venous thrombosis and embolism: Secondary | ICD-10-CM

## 2016-05-30 DIAGNOSIS — Z955 Presence of coronary angioplasty implant and graft: Secondary | ICD-10-CM

## 2016-05-30 DIAGNOSIS — R079 Chest pain, unspecified: Secondary | ICD-10-CM | POA: Diagnosis present

## 2016-05-30 DIAGNOSIS — E785 Hyperlipidemia, unspecified: Secondary | ICD-10-CM | POA: Diagnosis present

## 2016-05-30 DIAGNOSIS — E662 Morbid (severe) obesity with alveolar hypoventilation: Secondary | ICD-10-CM | POA: Diagnosis present

## 2016-05-30 DIAGNOSIS — Z6841 Body Mass Index (BMI) 40.0 and over, adult: Secondary | ICD-10-CM

## 2016-05-30 DIAGNOSIS — Z7984 Long term (current) use of oral hypoglycemic drugs: Secondary | ICD-10-CM

## 2016-05-30 DIAGNOSIS — E1140 Type 2 diabetes mellitus with diabetic neuropathy, unspecified: Secondary | ICD-10-CM | POA: Diagnosis present

## 2016-05-30 DIAGNOSIS — I2583 Coronary atherosclerosis due to lipid rich plaque: Secondary | ICD-10-CM

## 2016-05-30 DIAGNOSIS — I2510 Atherosclerotic heart disease of native coronary artery without angina pectoris: Principal | ICD-10-CM | POA: Diagnosis present

## 2016-05-30 LAB — BASIC METABOLIC PANEL
Anion gap: 12 (ref 5–15)
BUN: 9 mg/dL (ref 6–20)
CO2: 27 mmol/L (ref 22–32)
Calcium: 8.7 mg/dL — ABNORMAL LOW (ref 8.9–10.3)
Chloride: 97 mmol/L — ABNORMAL LOW (ref 101–111)
Creatinine, Ser: 0.76 mg/dL (ref 0.61–1.24)
GFR calc Af Amer: >60 mL/min (ref >60)
GFR calc non Af Amer: >60 mL/min (ref >60)
Glucose, Bld: 209 mg/dL — ABNORMAL HIGH (ref 65–99)
Potassium: 3.7 mmol/L (ref 3.5–5.1)
Sodium: 136 mmol/L (ref 135–145)

## 2016-05-30 LAB — CBC
HCT: 38.7 % — ABNORMAL LOW (ref 39.0–52.0)
Hemoglobin: 12.5 g/dL — ABNORMAL LOW (ref 13.0–17.0)
MCH: 29.5 pg (ref 26.0–34.0)
MCHC: 32.3 g/dL (ref 30.0–36.0)
MCV: 91.3 fL (ref 78.0–100.0)
Platelets: 268 10*3/uL (ref 150–400)
RBC: 4.24 MIL/uL (ref 4.22–5.81)
RDW: 14.4 % (ref 11.5–15.5)
WBC: 10.2 10*3/uL (ref 4.0–10.5)

## 2016-05-30 LAB — I-STAT TROPONIN, ED: Troponin i, poc: 0 ng/mL (ref 0.00–0.08)

## 2016-05-30 LAB — BRAIN NATRIURETIC PEPTIDE: B Natriuretic Peptide: 15.8 pg/mL (ref 0.0–100.0)

## 2016-05-30 MED ORDER — HYDROMORPHONE HCL 1 MG/ML IJ SOLN
1.0000 mg | Freq: Once | INTRAMUSCULAR | Status: AC
  Administered 2016-05-30: 1 mg via INTRAVENOUS
  Filled 2016-05-30: qty 1

## 2016-05-30 MED ORDER — SODIUM CHLORIDE 0.9 % IV BOLUS (SEPSIS)
1000.0000 mL | Freq: Once | INTRAVENOUS | Status: AC
  Administered 2016-05-30: 1000 mL via INTRAVENOUS

## 2016-05-30 MED ORDER — IOPAMIDOL (ISOVUE-370) INJECTION 76%
INTRAVENOUS | Status: AC
  Administered 2016-05-30: 100 mL
  Filled 2016-05-30: qty 100

## 2016-05-30 MED ORDER — MORPHINE SULFATE (PF) 4 MG/ML IV SOLN
8.0000 mg | Freq: Once | INTRAVENOUS | Status: AC
  Administered 2016-05-30: 8 mg via INTRAVENOUS
  Filled 2016-05-30: qty 2

## 2016-05-30 MED ORDER — NITROGLYCERIN 2 % TD OINT
1.0000 [in_us] | TOPICAL_OINTMENT | Freq: Once | TRANSDERMAL | Status: AC
  Administered 2016-05-30: 1 [in_us] via TOPICAL
  Filled 2016-05-30: qty 1

## 2016-05-30 NOTE — ED Notes (Signed)
MD gave patient apple juice.,

## 2016-05-30 NOTE — ED Notes (Signed)
Patient transported to CT 

## 2016-05-30 NOTE — ED Notes (Signed)
Called Lab added BNP

## 2016-05-30 NOTE — ED Triage Notes (Signed)
Pt is arrived via [facility] from [place] [facility] office, with acute chest pain, radiated to Lt shoulder. 4 ASA given, and I SL nitro given in route. SBP dropped from 180 to 120. EKG wnl per EMS.

## 2016-05-30 NOTE — ED Notes (Signed)
Report given to [facility] RN

## 2016-05-30 NOTE — ED Provider Notes (Signed)
[facility] DEPT Provider Note   CSN: [id number] Arrival date & time: 05/30/16  1710  First Provider Contact:  First MD Initiated Contact with Patient 05/30/16 1723     History   Chief Complaint Chief Complaint  Patient presents with  . Chest Pain    HPI [name] is a 55 y.o. male.  The history is provided by the patient and the EMS personnel. No language interpreter was used.  Chest Pain   This is a new problem. The current episode started 3 to 5 hours ago. The problem occurs constantly. The pain is associated with rest. The pain is present in the substernal region. The pain is at a severity of 7/10. The pain is moderate. The quality of the pain is described as pressure-like. The pain radiates to the left jaw and left shoulder. Duration of episode(s) is 3 hours. Associated symptoms include malaise/fatigue, nausea and shortness of breath. Pertinent negatives include no abdominal pain, no back pain, no cough, no exertional chest pressure, no fever, no near-syncope, no numbness, no orthopnea, no palpitations and no vomiting. He has tried nitroglycerin for the symptoms. The treatment provided mild relief. Risk factors include male gender and obesity.  His past medical history is significant for CAD, diabetes, hyperlipidemia and hypertension.  Pertinent negatives for past medical history include no seizures.    Past Medical History:  Diagnosis Date  . Back pain   . CAD (coronary artery disease)    Nonobstructive  . Hyperlipidemia   . Hypertension   . IBS (irritable bowel syndrome)   . OSA (obstructive sleep apnea)   . Pancreatitis   . Peptic ulcer   . PUD (peptic ulcer disease)   . Type 2 diabetes mellitus [facility])     Patient Active Problem List   Diagnosis Date Noted  . CAD (coronary artery disease) 01/06/2016  . DVT (deep venous thrombosis) ([place]) 01/06/2016  . Lactic acid acidosis 05/28/2014  . Abdominal pain 05/28/2014  . Chest pain 01/29/2014  . Uncontrolled  secondary diabetes with peripheral neuropathy ([place]) 01/29/2014  . Morbid obesity ([place]) 01/29/2014  . Snoring 01/29/2014  . Dyslipidemia 01/29/2014  . HTN (hypertension) 01/29/2014  . Abnormal nuclear stress test 01/29/2014    Past Surgical History:  Procedure Laterality Date  . LEFT HEART CATHETERIZATION WITH CORONARY ANGIOGRAM N/A 02/03/2014   Procedure: LEFT HEART CATHETERIZATION WITH CORONARY ANGIOGRAM;  Surgeon: Pixie Casino, MD;  Location: [facility] CATH LAB;  Service: Cardiovascular;  Laterality: N/A;       Home Medications    Prior to Admission medications   Medication Sig Start Date End Date Taking? Authorizing Provider  amLODipine (NORVASC) 10 MG tablet Take 1 tablet (10 mg total) by mouth daily. 06/01/14  Yes Hosie Poisson, MD  atorvastatin (LIPITOR) 80 MG tablet Take 1 tablet (80 mg total) by mouth daily. 01/06/16  Yes Lelon Perla, MD  clopidogrel (PLAVIX) 75 MG tablet Take 75 mg by mouth daily.   Yes Historical Provider, MD  famotidine (PEPCID) 20 MG tablet Take 20 mg by mouth 2 (two) times daily.   Yes Historical Provider, MD  furosemide (LASIX) 40 MG tablet Take 40 mg by mouth 2 (two) times daily.    Yes Historical Provider, MD  gabapentin (NEURONTIN) 300 MG capsule Take 300 mg by mouth See admin instructions. Take one tablet by mouth in the morning and lunch, then take two at dinner and 1 tablet at bedtime per patient   Yes Historical Provider, MD  glipiZIDE ([place])  10 MG tablet Take 1 tablet by mouth 2 (two) times daily. 11/13/15  Yes Historical Provider, MD  hydrALAZINE (APRESOLINE) 50 MG tablet Take 1 tablet (50 mg total) by mouth every 8 (eight) hours. Patient taking differently: Take 100 mg by mouth 2 (two) times daily.  05/29/14  Yes Hosie Poisson, MD  isosorbide dinitrate (ISORDIL) 30 MG tablet Take 30 mg by mouth at bedtime.   Yes Historical Provider, MD  lisinopril (PRINIVIL,ZESTRIL) 40 MG tablet Take 40 mg by mouth daily.   Yes Historical Provider, MD  metFORMIN  (GLUCOPHAGE) 500 MG tablet Take 500 mg by mouth 2 (two) times daily with a meal.   Yes Historical Provider, MD  metoprolol (LOPRESSOR) 50 MG tablet Take 1.5 tablets (75 mg total) by mouth 2 (two) times daily. Patient taking differently: Take 50 mg by mouth 2 (two) times daily.  01/06/16  Yes Lelon Perla, MD  Potassium Chloride ER (K-TAB) 20 MEQ TBCR Take 1 tablet by mouth daily.  11/10/15  Yes Historical Provider, MD  promethazine (PHENERGAN) 25 MG tablet Take 25 mg by mouth every 6 (six) hours as needed for nausea or vomiting.   Yes Historical Provider, MD  sertraline (ZOLOFT) 50 MG tablet Take 50 mg by mouth daily.   Yes Historical Provider, MD  sitaGLIPtin (JANUVIA) 100 MG tablet Take 100 mg by mouth daily.   Yes Historical Provider, MD  traZODone (DESYREL) 50 MG tablet Take 50 mg by mouth at bedtime.   Yes Historical Provider, MD  oxyCODONE-acetaminophen (PERCOCET/ROXICET) 5-325 MG per tablet Take 1 tablet by mouth every 4 (four) hours as needed for severe pain. Patient not taking: Reported on 05/30/2016 06/01/14   Hosie Poisson, MD    Family History Family History  Problem Relation Age of Onset  . Cancer Father   . Hypertension Mother   . Diabetes Mother   . Hypertension Brother   . Hypertension Sister     Social History Social History  Substance Use Topics  . Smoking status: Never Smoker  . Smokeless tobacco: Never Used  . Alcohol use No     Allergies   Ibuprofen   Review of Systems Review of Systems  Constitutional: Positive for malaise/fatigue. Negative for chills and fever.  HENT: Negative for ear pain and sore throat.   Eyes: Negative for pain and visual disturbance.  Respiratory: Positive for shortness of breath. Negative for cough.   Cardiovascular: Positive for chest pain. Negative for palpitations, orthopnea, leg swelling and near-syncope.  Gastrointestinal: Positive for nausea. Negative for abdominal pain and vomiting.  Genitourinary: Negative for dysuria and  hematuria.  Musculoskeletal: Negative for arthralgias and back pain.  Skin: Negative for color change and rash.  Neurological: Negative for seizures, syncope and numbness.  All other systems reviewed and are negative.    Physical Exam Updated Vital Signs BP 123/64   Pulse 62   Temp 98.7 F (37.1 C) (Oral)   Resp 15   SpO2 92%   Physical Exam  Constitutional: He appears well-developed and well-nourished.  HENT:  Head: Normocephalic and atraumatic.  Eyes: Conjunctivae are normal.  Neck: Neck supple.  Cardiovascular: Normal rate and regular rhythm.   No murmur heard. Pulmonary/Chest: Effort normal and breath sounds normal. No respiratory distress.  Abdominal: Soft. There is no tenderness.  Musculoskeletal: He exhibits no edema, tenderness or deformity.  Neurological: He is alert.  Skin: Skin is warm and dry.  Psychiatric: He has a normal mood and affect.  Nursing note and vitals reviewed.  ED Treatments / Results  Labs (all labs ordered are listed, but only abnormal results are displayed) Labs Reviewed  BASIC METABOLIC PANEL - Abnormal; Notable for the following:       Result Value   Chloride 97 (*)    Glucose, Bld 209 (*)    Calcium 8.7 (*)    All other components within normal limits  CBC - Abnormal; Notable for the following:    Hemoglobin 12.5 (*)    HCT 38.7 (*)    All other components within normal limits  BRAIN NATRIURETIC PEPTIDE  I-STAT TROPOININ, ED  I-STAT TROPOININ, ED    EKG  EKG Interpretation  Date/Time:  Monday May 30 2016 17:17:11 EDT Ventricular Rate:  75 PR Interval:    QRS Duration: 95 QT Interval:  406 QTC Calculation: 454 R Axis:   59 Text Interpretation:  Sinus rhythm Borderline T abnormalities, inferior leads No significant change since last tracing Confirmed by LITTLE MD, RACHEL [phone]) on 05/30/2016 5:21:45 PM       Radiology Dg Chest 2 View  Result Date: 05/30/2016 CLINICAL DATA:  Chest pain, shortness of Breath  starting today EXAM: CHEST  2 VIEW COMPARISON:  05/23/2016 FINDINGS: Cardiomediastinal silhouette is stable. No acute infiltrate or pulmonary edema. Degenerative changes thoracic spine. IMPRESSION: No active cardiopulmonary disease. Electronically Signed   By: Lahoma Crocker M.D.   On: 05/30/2016 18:19   Ct Head Wo Contrast  Result Date: 05/30/2016 CLINICAL DATA:  Acute onset of headache and blurred vision. Initial encounter. EXAM: CT HEAD WITHOUT CONTRAST TECHNIQUE: Contiguous axial images were obtained from the base of the skull through the vertex without intravenous contrast. COMPARISON:  CT of the head performed 03/21/2016 FINDINGS: There is no evidence of acute infarction, mass lesion, or intra- or extra-axial hemorrhage on CT. Mild periventricular and subcortical white matter change likely reflects small vessel ischemic microangiopathy. The posterior fossa, including the cerebellum, brainstem and fourth ventricle, is within normal limits. The third and lateral ventricles, and basal ganglia are unremarkable in appearance. The cerebral hemispheres are symmetric in appearance, with normal gray-white differentiation. No mass effect or midline shift is seen. There is no evidence of fracture; visualized osseous structures are unremarkable in appearance. The orbits are within normal limits. The paranasal sinuses and mastoid air cells are well-aerated. No significant soft tissue abnormalities are seen. IMPRESSION: 1. No acute intracranial pathology seen on CT. 2. Mild small vessel ischemic microangiopathy. Electronically Signed   By: Garald Balding M.D.   On: 05/30/2016 21:29   Ct Angio Chest Pe W And/or Wo Contrast  Result Date: 05/30/2016 CLINICAL DATA:  Left-sided chest pain today. EXAM: CT ANGIOGRAPHY CHEST WITH CONTRAST TECHNIQUE: Multidetector CT imaging of the chest was performed using the standard protocol during bolus administration of intravenous contrast. Multiplanar CT image reconstructions and MIPs were  obtained to evaluate the vascular anatomy. CONTRAST:  100 cc Isovue 370 COMPARISON:  Chest CT 04/18/2015 FINDINGS: Chest wall: No chest wall mass, supraclavicular or axillary lymphadenopathy. Mild gynecomastia, stable. Cardiovascular: The heart is mildly enlarged but stable. No pericardial effusion. The aorta is normal in caliber. No dissection. The branch vessels are patent. Stable pulmonary artery enlargement suggesting pulmonary hypertension. No filling defects to suggest pulmonary embolism. Mediastinum/Nodes: No mediastinal or hilar mass or adenopathy. The esophagus is grossly normal. Lungs/Pleura: Dependent subpleural atelectasis but no infiltrates, edema or effusions. Upper Abdomen: Diffuse fatty infiltration of the liver but no hepatic lesions. No upper abdominal mass or adenopathy. Stable small scattered celiac axis  and periportal lymph nodes. Musculoskeletal: No significant bony findings. Stable changes of DISH. Review of the MIP images confirms the above findings. IMPRESSION: 1. No CT findings for pulmonary embolism. 2. Normal thoracic aorta. 3. Stable central pulmonary artery enlargement suggesting pulmonary hypertension. 4. No significant pulmonary findings. 5. Diffuse fatty infiltration of the liver. Electronically Signed   By: Marijo Sanes M.D.   On: 05/30/2016 21:36    Procedures Procedures (including critical care time)  Medications Ordered in ED Medications  nitroGLYCERIN (NITROGLYN) 2 % ointment 1 inch (1 inch Topical Given 05/30/16 1819)  morphine 4 MG/ML injection 8 mg (8 mg Intravenous Given 05/30/16 1822)  sodium chloride 0.9 % bolus 1,000 mL (0 mLs Intravenous Stopped 05/30/16 2204)  iopamidol (ISOVUE-370) 76 % injection (100 mLs  Contrast Given 05/30/16 2031)  HYDROmorphone (DILAUDID) injection 1 mg (1 mg Intravenous Given 05/30/16 2202)     Initial Impression / Assessment and Plan / ED Course  I have reviewed the triage vital signs and the nursing notes.  Pertinent labs & imaging  results that were available during my care of the patient were reviewed by me and considered in my medical decision making (see chart for details).  Clinical Course   Patient with a history of CAD status post 1 stent presents via EMS for evaluation of chest sure radiating to his left neck and left arm. He was having some nausea as well as some mild lightheadedness during the peak of the pain. This occurred at rest and has continued. He received 324 mg of aspirin, nitroglycerin prior to arrival. Patient states some improvement in his chest pressure but the pain in his neck and arm continue. Patient with no numbness, weakness, tingling.  Vital signs stable. Patient alert and oriented. Cranial nerve exam unremarkable. Patient normal strength and sensation bilateral upper and lower extremities.  EKG performed and reviewed by myself. There is T-wave flattening in the lateral leads as compared to the prior EKG.  Differential diagnosis with ACS versus dissection versus pneumothorax versus pulmonary embolism versus pneumonia versus musculoskeletal.  Will obtain chest x-ray, troponin 2, BMP, CBC.  Patient given 1 inch Nitropaste, morphine help with his pain. Normal saline bolus ordered.   Due to high risk features, patient's comorbidities, patient will need admission for chest pain evaluation. Nuclear medicine stress test in March 17 was nondiagnostic.  Discussed case with cardiology who requested admission to hospitalist service for chest pain rule out.  Hospitalist paged for admission.  Discussed case with my attending, Dr. Rex Kras.    Final Clinical Impressions(s) / ED Diagnoses   Final diagnoses:  Nonspecific chest pain    New Prescriptions New Prescriptions   No medications on file     Vira Blanco, MD 05/30/16 Vera Cruz, MD 06/03/16 1800

## 2016-05-30 NOTE — ED Notes (Signed)
Pt denies chest pain at this time.

## 2016-05-30 NOTE — ED Notes (Signed)
Patient sent to x-ray by another staff member,

## 2016-05-30 NOTE — ED Notes (Signed)
Pt denies chest pain

## 2016-05-31 ENCOUNTER — Encounter (HOSPITAL_COMMUNITY): Payer: No Typology Code available for payment source

## 2016-05-31 ENCOUNTER — Observation Stay (HOSPITAL_COMMUNITY): Payer: Medicaid Other

## 2016-05-31 ENCOUNTER — Encounter (HOSPITAL_COMMUNITY): Payer: Self-pay | Admitting: Internal Medicine

## 2016-05-31 ENCOUNTER — Encounter (HOSPITAL_COMMUNITY)
Admission: EM | Disposition: A | Discharge status: Home / Self Care | Payer: Self-pay | Source: Home / Self Care | Attending: Internal Medicine

## 2016-05-31 DIAGNOSIS — I2510 Atherosclerotic heart disease of native coronary artery without angina pectoris: Principal | ICD-10-CM

## 2016-05-31 DIAGNOSIS — R609 Edema, unspecified: Secondary | ICD-10-CM | POA: Diagnosis not present

## 2016-05-31 DIAGNOSIS — Z6841 Body Mass Index (BMI) 40.0 and over, adult: Secondary | ICD-10-CM | POA: Diagnosis not present

## 2016-05-31 DIAGNOSIS — R600 Localized edema: Secondary | ICD-10-CM | POA: Diagnosis not present

## 2016-05-31 DIAGNOSIS — Z86718 Personal history of other venous thrombosis and embolism: Secondary | ICD-10-CM | POA: Diagnosis not present

## 2016-05-31 DIAGNOSIS — I209 Angina pectoris, unspecified: Secondary | ICD-10-CM | POA: Diagnosis not present

## 2016-05-31 DIAGNOSIS — R079 Chest pain, unspecified: Secondary | ICD-10-CM | POA: Diagnosis present

## 2016-05-31 DIAGNOSIS — E785 Hyperlipidemia, unspecified: Secondary | ICD-10-CM

## 2016-05-31 DIAGNOSIS — D649 Anemia, unspecified: Secondary | ICD-10-CM | POA: Diagnosis present

## 2016-05-31 DIAGNOSIS — E1159 Type 2 diabetes mellitus with other circulatory complications: Secondary | ICD-10-CM | POA: Diagnosis present

## 2016-05-31 DIAGNOSIS — Z7984 Long term (current) use of oral hypoglycemic drugs: Secondary | ICD-10-CM | POA: Diagnosis not present

## 2016-05-31 DIAGNOSIS — E662 Morbid (severe) obesity with alveolar hypoventilation: Secondary | ICD-10-CM | POA: Diagnosis present

## 2016-05-31 DIAGNOSIS — I10 Essential (primary) hypertension: Secondary | ICD-10-CM | POA: Diagnosis present

## 2016-05-31 DIAGNOSIS — Z7902 Long term (current) use of antithrombotics/antiplatelets: Secondary | ICD-10-CM | POA: Diagnosis not present

## 2016-05-31 DIAGNOSIS — E1140 Type 2 diabetes mellitus with diabetic neuropathy, unspecified: Secondary | ICD-10-CM | POA: Diagnosis present

## 2016-05-31 DIAGNOSIS — Z955 Presence of coronary angioplasty implant and graft: Secondary | ICD-10-CM | POA: Diagnosis not present

## 2016-05-31 HISTORY — PX: CARDIAC CATHETERIZATION: SHX172

## 2016-05-31 LAB — GLUCOSE, CAPILLARY
Glucose-Capillary: 251 mg/dL — ABNORMAL HIGH (ref 65–99)
Glucose-Capillary: 235 mg/dL — ABNORMAL HIGH (ref 65–99)
Glucose-Capillary: 197 mg/dL — ABNORMAL HIGH (ref 65–99)
Glucose-Capillary: 171 mg/dL — ABNORMAL HIGH (ref 65–99)
Glucose-Capillary: 299 mg/dL — ABNORMAL HIGH (ref 65–99)

## 2016-05-31 LAB — LIPID PANEL
Cholesterol: 82 mg/dL (ref 0–200)
HDL: 26 mg/dL — ABNORMAL LOW (ref >40)
LDL Cholesterol: 7 mg/dL (ref 0–99)
Total CHOL/HDL Ratio: 3.2 RATIO
Triglycerides: 244 mg/dL — ABNORMAL HIGH (ref <150)
VLDL: 49 mg/dL — ABNORMAL HIGH (ref 0–40)

## 2016-05-31 LAB — CBC
HCT: 35.9 % — ABNORMAL LOW (ref 39.0–52.0)
Hemoglobin: 11.6 g/dL — ABNORMAL LOW (ref 13.0–17.0)
MCH: 29.4 pg (ref 26.0–34.0)
MCHC: 32.3 g/dL (ref 30.0–36.0)
MCV: 91.1 fL (ref 78.0–100.0)
Platelets: 178 10*3/uL (ref 150–400)
RBC: 3.94 MIL/uL — ABNORMAL LOW (ref 4.22–5.81)
RDW: 14.4 % (ref 11.5–15.5)
WBC: 8.4 10*3/uL (ref 4.0–10.5)

## 2016-05-31 LAB — POCT ACTIVATED CLOTTING TIME: Activated Clotting Time: 456 seconds

## 2016-05-31 LAB — CREATININE, SERUM
Creatinine, Ser: 0.74 mg/dL (ref 0.61–1.24)
GFR calc Af Amer: >60 mL/min (ref >60)
GFR calc non Af Amer: >60 mL/min (ref >60)

## 2016-05-31 LAB — TROPONIN I
Troponin I: <0.03 ng/mL (ref <0.03)
Troponin I: <0.03 ng/mL (ref <0.03)

## 2016-05-31 LAB — MRSA PCR SCREENING: MRSA by PCR: NEGATIVE

## 2016-05-31 SURGERY — LEFT HEART CATH AND CORONARY ANGIOGRAPHY

## 2016-05-31 MED ORDER — BIVALIRUDIN BOLUS VIA INFUSION - CUPID
INTRAVENOUS | Status: DC | PRN
  Administered 2016-05-31: 110.175 mg via INTRAVENOUS

## 2016-05-31 MED ORDER — FUROSEMIDE 40 MG PO TABS
40.0000 mg | ORAL_TABLET | Freq: Two times a day (BID) | ORAL | Status: DC
  Administered 2016-05-31: 40 mg via ORAL
  Filled 2016-05-31: qty 1

## 2016-05-31 MED ORDER — LISINOPRIL 40 MG PO TABS
40.0000 mg | ORAL_TABLET | Freq: Every day | ORAL | Status: DC
  Administered 2016-06-01: 40 mg via ORAL
  Filled 2016-05-31 (×2): qty 1
  Filled 2016-05-31: qty 2

## 2016-05-31 MED ORDER — ATORVASTATIN CALCIUM 80 MG PO TABS
80.0000 mg | ORAL_TABLET | Freq: Every day | ORAL | Status: DC
  Administered 2016-05-31 – 2016-06-01 (×3): 80 mg via ORAL
  Filled 2016-05-31 (×5): qty 1

## 2016-05-31 MED ORDER — VERAPAMIL HCL 2.5 MG/ML IV SOLN
INTRAVENOUS | Status: DC | PRN
  Administered 2016-05-31: 10 mL via INTRA_ARTERIAL

## 2016-05-31 MED ORDER — HYDRALAZINE HCL 50 MG PO TABS
100.0000 mg | ORAL_TABLET | Freq: Two times a day (BID) | ORAL | Status: DC
  Administered 2016-05-31 – 2016-06-01 (×3): 100 mg via ORAL
  Filled 2016-05-31 (×5): qty 2

## 2016-05-31 MED ORDER — GABAPENTIN 300 MG PO CAPS
300.0000 mg | ORAL_CAPSULE | ORAL | Status: DC
  Administered 2016-05-31 – 2016-06-01 (×5): 300 mg via ORAL
  Filled 2016-05-31 (×6): qty 1

## 2016-05-31 MED ORDER — TICAGRELOR 90 MG PO TABS
ORAL_TABLET | ORAL | Status: AC
  Filled 2016-05-31: qty 1

## 2016-05-31 MED ORDER — GLIPIZIDE 5 MG PO TABS
10.0000 mg | ORAL_TABLET | Freq: Two times a day (BID) | ORAL | Status: DC
  Administered 2016-06-01: 10 mg via ORAL
  Filled 2016-05-31: qty 2

## 2016-05-31 MED ORDER — HYDRALAZINE HCL 50 MG PO TABS: 100.0000 mg | ORAL_TABLET | Freq: Two times a day (BID) | ORAL | Status: DC

## 2016-05-31 MED ORDER — FENTANYL CITRATE (PF) 100 MCG/2ML IJ SOLN
INTRAMUSCULAR | Status: DC | PRN
  Administered 2016-05-31: 50 ug via INTRAVENOUS
  Administered 2016-05-31 (×2): 25 ug via INTRAVENOUS

## 2016-05-31 MED ORDER — AMLODIPINE BESYLATE 10 MG PO TABS
10.0000 mg | ORAL_TABLET | Freq: Every day | ORAL | Status: DC
  Administered 2016-05-31 – 2016-06-01 (×2): 10 mg via ORAL
  Filled 2016-05-31 (×2): qty 1
  Filled 2016-05-31: qty 2

## 2016-05-31 MED ORDER — HEPARIN (PORCINE) IN NACL 2-0.9 UNIT/ML-% IJ SOLN
INTRAMUSCULAR | Status: AC
  Filled 2016-05-31: qty 1000

## 2016-05-31 MED ORDER — ADENOSINE (DIAGNOSTIC) 140MCG/KG/MIN
INTRAVENOUS | Status: DC | PRN
  Administered 2016-05-31: 140 ug/kg/min via INTRAVENOUS

## 2016-05-31 MED ORDER — GABAPENTIN 300 MG PO CAPS: 300.0000 mg | ORAL_CAPSULE | ORAL | Status: DC

## 2016-05-31 MED ORDER — VERAPAMIL HCL 2.5 MG/ML IV SOLN
INTRAVENOUS | Status: AC
  Filled 2016-05-31: qty 2

## 2016-05-31 MED ORDER — ADENOSINE 12 MG/4ML IV SOLN
INTRAVENOUS | Status: AC
  Filled 2016-05-31: qty 4

## 2016-05-31 MED ORDER — HEPARIN (PORCINE) IN NACL 2-0.9 UNIT/ML-% IJ SOLN
INTRAMUSCULAR | Status: DC | PRN
  Administered 2016-05-31: 1000 mL

## 2016-05-31 MED ORDER — IOPAMIDOL (ISOVUE-370) INJECTION 76%
INTRAVENOUS | Status: AC
  Filled 2016-05-31: qty 100

## 2016-05-31 MED ORDER — FAMOTIDINE 20 MG PO TABS
20.0000 mg | ORAL_TABLET | Freq: Two times a day (BID) | ORAL | Status: DC
  Administered 2016-05-31 – 2016-06-01 (×3): 20 mg via ORAL
  Filled 2016-05-31 (×5): qty 1

## 2016-05-31 MED ORDER — ENOXAPARIN SODIUM 40 MG/0.4ML ~~LOC~~ SOLN
40.0000 mg | SUBCUTANEOUS | Status: DC
  Administered 2016-06-01: 11:00:00 40 mg via SUBCUTANEOUS
  Filled 2016-05-31: qty 0.4

## 2016-05-31 MED ORDER — MIDAZOLAM HCL 2 MG/2ML IJ SOLN
INTRAMUSCULAR | Status: DC | PRN
  Administered 2016-05-31 (×3): 1 mg via INTRAVENOUS

## 2016-05-31 MED ORDER — SODIUM CHLORIDE 0.9 % IV SOLN: 250.0000 mL | INTRAVENOUS | Status: DC | PRN

## 2016-05-31 MED ORDER — SODIUM CHLORIDE 0.9 % IV SOLN
INTRAVENOUS | Status: DC | PRN
  Administered 2016-05-31 (×2): 1.75 mg/kg/h via INTRAVENOUS

## 2016-05-31 MED ORDER — HEPARIN SODIUM (PORCINE) 1000 UNIT/ML IJ SOLN
INTRAMUSCULAR | Status: AC
  Filled 2016-05-31: qty 1

## 2016-05-31 MED ORDER — CLOPIDOGREL BISULFATE 75 MG PO TABS
75.0000 mg | ORAL_TABLET | Freq: Every day | ORAL | Status: DC
  Administered 2016-05-31 – 2016-06-01 (×2): 75 mg via ORAL
  Filled 2016-05-31 (×2): qty 1

## 2016-05-31 MED ORDER — FENTANYL CITRATE (PF) 100 MCG/2ML IJ SOLN
INTRAMUSCULAR | Status: AC
  Filled 2016-05-31: qty 2

## 2016-05-31 MED ORDER — BIVALIRUDIN 250 MG IV SOLR
INTRAVENOUS | Status: AC
  Filled 2016-05-31: qty 250

## 2016-05-31 MED ORDER — LIDOCAINE HCL (PF) 1 % IJ SOLN
INTRAMUSCULAR | Status: DC | PRN
  Administered 2016-05-31: 2 mL via INTRADERMAL

## 2016-05-31 MED ORDER — SODIUM CHLORIDE 0.9% FLUSH: 3.0000 mL | Freq: Two times a day (BID) | INTRAVENOUS | Status: DC

## 2016-05-31 MED ORDER — POTASSIUM CHLORIDE CRYS ER 20 MEQ PO TBCR
20.0000 meq | EXTENDED_RELEASE_TABLET | Freq: Every day | ORAL | Status: DC
  Administered 2016-05-31 – 2016-06-01 (×2): 20 meq via ORAL
  Filled 2016-05-31 (×3): qty 1

## 2016-05-31 MED ORDER — MORPHINE SULFATE (PF) 2 MG/ML IV SOLN
2.0000 mg | INTRAVENOUS | Status: DC | PRN
  Administered 2016-05-31 – 2016-06-01 (×4): 2 mg via INTRAVENOUS
  Filled 2016-05-31 (×5): qty 1

## 2016-05-31 MED ORDER — ENOXAPARIN SODIUM 40 MG/0.4ML ~~LOC~~ SOLN: 40.0000 mg | SUBCUTANEOUS | Status: DC

## 2016-05-31 MED ORDER — MIDAZOLAM HCL 2 MG/2ML IJ SOLN
INTRAMUSCULAR | Status: AC
  Filled 2016-05-31: qty 2

## 2016-05-31 MED ORDER — FAMOTIDINE 20 MG PO TABS: 20.0000 mg | ORAL_TABLET | Freq: Two times a day (BID) | ORAL | Status: DC

## 2016-05-31 MED ORDER — INSULIN ASPART 100 UNIT/ML ~~LOC~~ SOLN
0.0000 [IU] | Freq: Three times a day (TID) | SUBCUTANEOUS | Status: DC
  Administered 2016-05-31: 3 [IU] via SUBCUTANEOUS
  Administered 2016-05-31 (×2): 2 [IU] via SUBCUTANEOUS
  Administered 2016-06-01 (×2): 3 [IU] via SUBCUTANEOUS

## 2016-05-31 MED ORDER — LIDOCAINE HCL (PF) 1 % IJ SOLN
INTRAMUSCULAR | Status: AC
  Filled 2016-05-31: qty 30

## 2016-05-31 MED ORDER — SODIUM CHLORIDE 0.9% FLUSH: 3.0000 mL | INTRAVENOUS | Status: DC | PRN

## 2016-05-31 MED ORDER — SERTRALINE HCL 50 MG PO TABS
50.0000 mg | ORAL_TABLET | Freq: Every day | ORAL | Status: DC
  Administered 2016-05-31 – 2016-06-01 (×2): 50 mg via ORAL
  Filled 2016-05-31 (×3): qty 1

## 2016-05-31 MED ORDER — HEPARIN SODIUM (PORCINE) 1000 UNIT/ML IJ SOLN
INTRAMUSCULAR | Status: DC | PRN
  Administered 2016-05-31: 6000 [IU] via INTRAVENOUS

## 2016-05-31 MED ORDER — METOPROLOL TARTRATE 25 MG PO TABS
50.0000 mg | ORAL_TABLET | Freq: Two times a day (BID) | ORAL | Status: DC
  Administered 2016-05-31 – 2016-06-01 (×3): 50 mg via ORAL
  Filled 2016-05-31 (×5): qty 2

## 2016-05-31 MED ORDER — SODIUM CHLORIDE 0.9% FLUSH
3.0000 mL | Freq: Two times a day (BID) | INTRAVENOUS | Status: DC
  Administered 2016-05-31 – 2016-06-01 (×2): 3 mL via INTRAVENOUS

## 2016-05-31 MED ORDER — METOPROLOL TARTRATE 25 MG PO TABS: 50.0000 mg | ORAL_TABLET | Freq: Two times a day (BID) | ORAL | Status: DC

## 2016-05-31 MED ORDER — LINAGLIPTIN 5 MG PO TABS
5.0000 mg | ORAL_TABLET | Freq: Every day | ORAL | Status: DC
  Administered 2016-06-01: 5 mg via ORAL
  Filled 2016-05-31: qty 1

## 2016-05-31 MED ORDER — GABAPENTIN 300 MG PO CAPS
600.0000 mg | ORAL_CAPSULE | Freq: Every day | ORAL | Status: DC
  Administered 2016-05-31: 600 mg via ORAL
  Filled 2016-05-31: qty 2

## 2016-05-31 MED ORDER — ISOSORBIDE MONONITRATE ER 30 MG PO TB24
30.0000 mg | ORAL_TABLET | Freq: Every day | ORAL | Status: DC
  Administered 2016-05-31 (×2): 30 mg via ORAL
  Filled 2016-05-31 (×4): qty 1

## 2016-05-31 MED ORDER — SODIUM CHLORIDE 0.9 % WEIGHT BASED INFUSION
1.0000 mL/kg/h | INTRAVENOUS | Status: AC
  Administered 2016-05-31: 1 mL/kg/h via INTRAVENOUS

## 2016-05-31 MED ORDER — ACETAMINOPHEN 325 MG PO TABS
650.0000 mg | ORAL_TABLET | ORAL | Status: DC | PRN
  Filled 2016-05-31: qty 2

## 2016-05-31 MED ORDER — ASPIRIN 81 MG PO CHEW
81.0000 mg | CHEWABLE_TABLET | ORAL | Status: AC
  Administered 2016-05-31: 81 mg via ORAL
  Filled 2016-05-31: qty 1

## 2016-05-31 MED ORDER — IOPAMIDOL (ISOVUE-370) INJECTION 76%
INTRAVENOUS | Status: AC
  Filled 2016-05-31: qty 50

## 2016-05-31 MED ORDER — ASPIRIN 81 MG PO CHEW
81.0000 mg | CHEWABLE_TABLET | Freq: Every day | ORAL | Status: DC
  Administered 2016-06-01: 81 mg via ORAL
  Filled 2016-05-31: qty 1

## 2016-05-31 MED ORDER — TRAZODONE HCL 50 MG PO TABS
50.0000 mg | ORAL_TABLET | Freq: Every day | ORAL | Status: DC
  Administered 2016-05-31: 50 mg via ORAL
  Filled 2016-05-31 (×2): qty 1

## 2016-05-31 MED ORDER — ONDANSETRON HCL 4 MG/2ML IJ SOLN: 4.0000 mg | Freq: Four times a day (QID) | INTRAMUSCULAR | Status: DC | PRN

## 2016-05-31 MED ORDER — SODIUM CHLORIDE 0.9 % IV SOLN
INTRAVENOUS | Status: DC
  Administered 2016-05-31: 11:00:00 via INTRAVENOUS

## 2016-05-31 SURGICAL SUPPLY — 21 items
BALLN EMERGE MR 3.0X12 (BALLOONS) ×2
BALLN ~~LOC~~ EUPHORA RX 4.0X12 (BALLOONS) ×2
BALLOON EMERGE MR 3.0X12 (BALLOONS) ×1 IMPLANT
BALLOON ~~LOC~~ EUPHORA RX 4.0X12 (BALLOONS) ×1 IMPLANT
CATH INFINITI 5 FR JL3.5 (CATHETERS) ×2 IMPLANT
CATH INFINITI 5FR ANG PIGTAIL (CATHETERS) ×2 IMPLANT
CATH INFINITI 5FR JL4 (CATHETERS) ×2 IMPLANT
CATH INFINITI JR4 5F (CATHETERS) ×2 IMPLANT
CATH VISTA GUIDE 6FR XBLAD4 (CATHETERS) ×2 IMPLANT
DEVICE RAD COMP TR BAND LRG (VASCULAR PRODUCTS) ×2 IMPLANT
GLIDESHEATH SLEND SS 6F .021 (SHEATH) ×2 IMPLANT
GUIDEWIRE PRESSURE COMET II (WIRE) ×2 IMPLANT
KIT ENCORE 26 ADVANTAGE (KITS) ×2 IMPLANT
KIT ESSENTIALS PG (KITS) ×2 IMPLANT
KIT HEART LEFT (KITS) ×2 IMPLANT
PACK CARDIAC CATHETERIZATION (CUSTOM PROCEDURE TRAY) ×2 IMPLANT
STENT SYNERGY DES 4X16 (Permanent Stent) ×2 IMPLANT
SYR MEDRAD MARK V 150ML (SYRINGE) IMPLANT
TRANSDUCER W/STOPCOCK (MISCELLANEOUS) ×2 IMPLANT
TUBING CIL FLEX 10 FLL-RA (TUBING) ×2 IMPLANT
WIRE J 3MM .035X260CM (WIRE) ×2 IMPLANT

## 2016-05-31 NOTE — Consult Note (Signed)
CARDIOLOGY CONSULT NOTE   Patient ID: [name] MRN: [id number] DOB/AGE: April 13, 1961 55 y.o.  Admit date: 05/30/2016  Primary Physician   Geannie Risen, MD Primary Cardiologist   Dr. Stanford Breed Reason for Consultation   Chest pain  Requesting Physician  Dr. Tana Coast  HPI: [name] is a 55 y.o. male with a history of non-obstructive coronary artery disease, hypertension, hyperlipidemia, peptic ulcer disease, diabetes, DVT (off anticoagulation last month), and OSA on CPAP who presented to [facility] ER for evaluation of chest pain.  Nuclear 11/2013 in [facility] showed normal perfusion and ejection fraction 47%. Cardiac catheterization April 2015 showed a 50% stenosis at the LAD first diagonal bifurcation but no other significant disease noted. Last seen by Dr. Stanford Breed 01/06/16 for atypical chest pain. BB increased due to elevated blood pressure. Follow-up stress test 02/02/2016 was low risk study with EF of 55-65%.   The patient had a syncope episode 03/21/2016-->  felt vasovagal. Followed by Dr. Tonia Ghent @ [facility]. Echo 02/2016 with EF of 60-65%. Carotid doppler 03/21/16 showed bilateral 40-59% stenosis in the internal carotid arteries. Seen by GI for dysphagia  05/09/2016 by Dr. Dorrene German. Pending EGD 06/08/16. He also has a upcoming referral to vascular surgeon for symptoms concerning for claudication.  Study patient had a sudden onset of left axilla sharp stabbing pain while sitting. The pain radiated to to left anterior chest then left neck and then to left shoulder and arm. Then patient had a dull achy chest pain. Is to having intermittent shortness of breath and lower extremity edema. Recently increased Lasix with good diuresis. The patient had a episode of severe dizziness and headache while in the ER. Few days ago patient had a episode of diarrhea and nausea and felt likely due to IBS. Chest discomfort somewhat improved on nitroglycerin and IV dilaudid in the ER.  Troponin x 2  negative. Point-of-care troponin negative. BNP within normal limits. Creatinine normal. Hemoglobin of 11.6. EKG shows sinus rhythm at a rate of 75 bpm nonspecific T-wave abnormality in inferior lateral lead which is somewhat more pronounced than previous EKG of 01/06/16. CT angiogram of chest showed no evidence of pulmonary embolism, diffuse fatty liver, pulmonary artery enlargement suggests pulmonary hypertension. CT of head in ER shows no acute abnormality however shows mild small vessel ischemic microangiopathy.  Past Medical History:  Diagnosis Date  . Back pain   . CAD (coronary artery disease)    Nonobstructive  . Hyperlipidemia   . Hypertension   . IBS (irritable bowel syndrome)   . OSA (obstructive sleep apnea)   . Pancreatitis   . Peptic ulcer   . PUD (peptic ulcer disease)   . Type 2 diabetes mellitus ([place])      Past Surgical History:  Procedure Laterality Date  . LEFT HEART CATHETERIZATION WITH CORONARY ANGIOGRAM N/A 02/03/2014   Procedure: LEFT HEART CATHETERIZATION WITH CORONARY ANGIOGRAM;  Surgeon: Pixie Casino, MD;  Location: [facility] C[facility] LAB;  Service: Cardiovascular;  Laterality: N/A;    Allergies  Allergen Reactions  . Ibuprofen     Made gastric ulcers worse    I have reviewed the patient's current medications . amLODipine  10 mg Oral Daily  . atorvastatin  80 mg Oral Daily  . clopidogrel  75 mg Oral Daily  . enoxaparin (LOVENOX) injection  40 mg Subcutaneous Q24H  . famotidine  20 mg Oral BID  . furosemide  40 mg Oral BID  . gabapentin  300 mg Oral 3 times per  day  . gabapentin  600 mg Oral Q supper  . glipiZIDE  10 mg Oral BID WC  . hydrALAZINE  100 mg Oral BID  . insulin aspart  0-9 Units Subcutaneous TID WC  . isosorbide mononitrate  30 mg Oral QHS  . linagliptin  5 mg Oral Daily  . lisinopril  40 mg Oral Daily  . metoprolol  50 mg Oral BID  . potassium chloride SA  20 mEq Oral Daily  . sertraline  50 mg Oral Daily  . traZODone  50 mg Oral QHS       acetaminophen, morphine injection, ondansetron (ZOFRAN) IV  Prior to Admission medications   Medication Sig Start Date End Date Taking? Authorizing Provider  amLODipine (NORVASC) 10 MG tablet Take 1 tablet (10 mg total) by mouth daily. 06/01/14  Yes Hosie Poisson, MD  atorvastatin (LIPITOR) 80 MG tablet Take 1 tablet (80 mg total) by mouth daily. 01/06/16  Yes Lelon Perla, MD  clopidogrel (PLAVIX) 75 MG tablet Take 75 mg by mouth daily.   Yes Historical Provider, MD  famotidine (PEPCID) 20 MG tablet Take 20 mg by mouth 2 (two) times daily.   Yes Historical Provider, MD  furosemide (LASIX) 40 MG tablet Take 40 mg by mouth 2 (two) times daily.    Yes Historical Provider, MD  gabapentin (NEURONTIN) 300 MG capsule Take 300 mg by mouth See admin instructions. Take one tablet by mouth in the morning and lunch, then take two at dinner and 1 tablet at bedtime per patient   Yes Historical Provider, MD  glipiZIDE (GLUCOTROL) 10 MG tablet Take 1 tablet by mouth 2 (two) times daily. 11/13/15  Yes Historical Provider, MD  hydrALAZINE (APRESOLINE) 50 MG tablet Take 1 tablet (50 mg total) by mouth every 8 (eight) hours. Patient taking differently: Take 100 mg by mouth 2 (two) times daily.  05/29/14  Yes Hosie Poisson, MD  isosorbide dinitrate (ISORDIL) 30 MG tablet Take 30 mg by mouth at bedtime.   Yes Historical Provider, MD  lisinopril (PRINIVIL,ZESTRIL) 40 MG tablet Take 40 mg by mouth daily.   Yes Historical Provider, MD  metFORMIN (GLUCOPHAGE) 500 MG tablet Take 500 mg by mouth 2 (two) times daily with a meal.   Yes Historical Provider, MD  metoprolol (LOPRESSOR) 50 MG tablet Take 1.5 tablets (75 mg total) by mouth 2 (two) times daily. Patient taking differently: Take 50 mg by mouth 2 (two) times daily.  01/06/16  Yes Lelon Perla, MD  Potassium Chloride ER (K-TAB) 20 MEQ TBCR Take 1 tablet by mouth daily.  11/10/15  Yes Historical Provider, MD  promethazine (PHENERGAN) 25 MG tablet Take 25 mg by mouth  every 6 (six) hours as needed for nausea or vomiting.   Yes Historical Provider, MD  sertraline (ZOLOFT) 50 MG tablet Take 50 mg by mouth daily.   Yes Historical Provider, MD  sitaGLIPtin (JANUVIA) 100 MG tablet Take 100 mg by mouth daily.   Yes Historical Provider, MD  traZODone (DESYREL) 50 MG tablet Take 50 mg by mouth at bedtime.   Yes Historical Provider, MD  oxyCODONE-acetaminophen (PERCOCET/ROXICET) 5-325 MG per tablet Take 1 tablet by mouth every 4 (four) hours as needed for severe pain. Patient not taking: Reported on 05/30/2016 06/01/14   Hosie Poisson, MD     Social History   Social History  . Marital status: Single    Spouse name: N/A  . Number of children: 3  . Years of education: 38  Occupational History  . Scientist, product/process development   Social History Main Topics  . Smoking status: Never Smoker  . Smokeless tobacco: Never Used  . Alcohol use No  . Drug use: No  . Sexual activity: Not on file   Other Topics Concern  . Not on file   Social History Narrative  . No narrative on file    Family Status  Relation Status  . Father Deceased at age 15   cancer  . Mother   . Brother   . Sister    Family History  Problem Relation Age of Onset  . Cancer Father   . Hypertension Mother   . Diabetes Mother   . Hypertension Brother   . Hypertension Sister       ROS:  Full 14 point review of systems complete and found to be negative unless listed above.  Physical Exam: Blood pressure 115/64, pulse 64, temperature 97.9 F (36.6 C), temperature source Oral, resp. rate 18, height 5\' 10"  (1.778 m), weight (!) 323 lb 12.8 oz (146.9 kg), SpO2 98 %.  General: Well developed, well nourished, obsess  male in no acute distress Head: Eyes PERRLA, No xanthomas. Normocephalic and atraumatic, oropharynx without edema or exudate.  Lungs: Resp regular and unlabored, CTA. Heart: RRR no s3, s4, or murmurs..   Neck: No carotid bruits. No lymphadenopathy.  No JVD. Abdomen: Bowel  sounds present, abdomen soft and non-tender without masses or hernias noted. Msk:  No spine or cva tenderness. No weakness, no joint deformities or effusions. Extremities: No clubbing, cyanosis. 1+ BL LE  Edema with chronic skin changes. DP/PT/Radials 2+ and equal bilaterally. Neuro: Alert and oriented X 3. No focal deficits noted. Psych:  Good affect, responds appropriately Skin: No rashes or lesions noted.  Labs:   Lab Results  Component Value Date   WBC 8.4 05/31/2016   HGB 11.6 (L) 05/31/2016   HCT 35.9 (L) 05/31/2016   MCV 91.1 05/31/2016   PLT 178 05/31/2016   No results for input(s): INR in the last 72 hours.  Recent Labs Lab 05/30/16 1813 05/31/16 0050  NA 136  --   K 3.7  --   CL 97*  --   CO2 27  --   BUN 9  --   CREATININE 0.76 0.74  CALCIUM 8.7*  --   GLUCOSE 209*  --    No results found for: MG  Recent Labs  05/31/16 0050 05/31/16 0114  TROPONINI <0.03 <0.03    Recent Labs  05/30/16 1815  TROPIPOC 0.00   No results found for: PROBNP Lab Results  Component Value Date   CHOL 88 (L) 02/01/2016   HDL 27 (L) 02/01/2016   LDLCALC 5 02/01/2016   TRIG 281 (H) 02/01/2016     Echo: 02/2016 Conclusions Summary Normal left ventricular size and systolic function with no appreciable segmental abnormality. Ejection fraction is visually estimated at 60-65% Mild concentric left ventricular hypertrophy Mild tricuspid regurgitation Mild mitral regurgitation Signature ------------------------------------------------------------------------------  Electronically signed by Rozann Lesches, MD(Interpreting physician) on 03/22/2016  02:29 PM ------------------------------------------------------------------------------ Findings Mitral Valve Structurally normal mitral valve with good mobility and no significant regurgitation by color flow doppler examination. Aortic Valve Structurally normal aortic valve with good leaflet mobility, and no regurgitation by color  flow doppler examination. Tricuspid Valve Tricuspid valve is structurally normal. No significant tricuspid regurgitation by color flow doppler examination. Pulmonic Valve Pulmonic valve is structurally normal. No Doppler evidence of pulmonic stenosis or insufficiency. Left Atrium Normal  size left atrium. Left Ventricle Mild concentric left ventricular hypertrophy Ejection fraction is visually estimated at 60-65% Normal left ventricular size and systolic function with no appreciable segmental abnormality. Right Atrium Normal right atrium. Right Ventricle Normal right ventricular size and function. Pericardial Effusion No evidence of pericardial effusion. Miscellaneous The aorta is within normal limits. The IVC is dilated M-Mode/2D Measurements & Calculations  LV Diastolic Dimension:LV Systolic Dimension: LA Dimension: 3.9 cmAO  5.12 cm2.93 cmRoot Dimension: 3.6 cm  LV FS:42.77 %LV Volume Diastolic: Q000111Q  LV PW Diastolic: 123456 cm ml  Septum Diastolic: [id number] LV Volume Systolic: [id number]  cm ml LV EDV/LV EDV Index: 85.2LA/Aorta: 1.08 ml/31 m2LV ESV/LV ESV Index: 35.9 ml/13 m2  LV Area Diastolic: 123456 Calculated: 57.86 %  cm2  LV Area Systolic: 18 cm2 LV Length: 9.4 cm Doppler Measurements & Calculations  MV Peak E-Wave: 104 cm/s  MV Peak A-Wave: 73.2 cm/s  MV E/A Ratio: 1.42  MV Peak Gradient: 4.33 mmHg  MV Deceleration Time: 151 msec  TDI E' Velocity: 10.4 cm/s  E/E' prime 10  ECG:  sinus rhythm at a rate of 75 bpm nonspecific T-wave abnormality in inferior lateral lead which is somewhat more pronounced than previous EKG of 01/06/16.   Radiology:  Dg Chest 2 View  Result Date: 05/30/2016 CLINICAL DATA:  Chest pain, shortness of Breath starting today EXAM: CHEST  2 VIEW COMPARISON:  05/23/2016 FINDINGS:  Cardiomediastinal silhouette is stable. No acute infiltrate or pulmonary edema. Degenerative changes thoracic spine. IMPRESSION: No active cardiopulmonary disease. Electronically Signed   By: Lahoma Crocker M.D.   On: 05/30/2016 18:19   Ct Head Wo Contrast  Result Date: 05/30/2016 CLINICAL DATA:  Acute onset of headache and blurred vision. Initial encounter. EXAM: CT HEAD WITHOUT CONTRAST TECHNIQUE: Contiguous axial images were obtained from the base of the skull through the vertex without intravenous contrast. COMPARISON:  CT of the head performed 03/21/2016 FINDINGS: There is no evidence of acute infarction, mass lesion, or intra- or extra-axial hemorrhage on CT. Mild periventricular and subcortical white matter change likely reflects small vessel ischemic microangiopathy. The posterior fossa, including the cerebellum, brainstem and fourth ventricle, is within normal limits. The third and lateral ventricles, and basal ganglia are unremarkable in appearance. The cerebral hemispheres are symmetric in appearance, with normal gray-white differentiation. No mass effect or midline shift is seen. There is no evidence of fracture; visualized osseous structures are unremarkable in appearance. The orbits are within normal limits. The paranasal sinuses and mastoid air cells are well-aerated. No significant soft tissue abnormalities are seen. IMPRESSION: 1. No acute intracranial pathology seen on CT. 2. Mild small vessel ischemic microangiopathy. Electronically Signed   By: Garald Balding M.D.   On: 05/30/2016 21:29   Ct Angio Chest Pe W And/or Wo Contrast  Result Date: 05/30/2016 CLINICAL DATA:  Left-sided chest pain today. EXAM: CT ANGIOGRAPHY CHEST WITH CONTRAST TECHNIQUE: Multidetector CT imaging of the chest was performed using the standard protocol during bolus administration of intravenous contrast. Multiplanar CT image reconstructions and MIPs were obtained to evaluate the vascular anatomy. CONTRAST:  100 cc Isovue  370 COMPARISON:  Chest CT 04/18/2015 FINDINGS: Chest wall: No chest wall mass, supraclavicular or axillary lymphadenopathy. Mild gynecomastia, stable. Cardiovascular: The heart is mildly enlarged but stable. No pericardial effusion. The aorta is normal in caliber. No dissection. The branch vessels are patent. Stable pulmonary artery enlargement suggesting pulmonary hypertension. No filling defects to suggest pulmonary embolism. Mediastinum/Nodes: No mediastinal or hilar mass or adenopathy. The  esophagus is grossly normal. Lungs/Pleura: Dependent subpleural atelectasis but no infiltrates, edema or effusions. Upper Abdomen: Diffuse fatty infiltration of the liver but no hepatic lesions. No upper abdominal mass or adenopathy. Stable small scattered celiac axis and periportal lymph nodes. Musculoskeletal: No significant bony findings. Stable changes of DISH. Review of the MIP images confirms the above findings. IMPRESSION: 1. No CT findings for pulmonary embolism. 2. Normal thoracic aorta. 3. Stable central pulmonary artery enlargement suggesting pulmonary hypertension. 4. No significant pulmonary findings. 5. Diffuse fatty infiltration of the liver. Electronically Signed   By: Marijo Sanes M.D.   On: 05/30/2016 21:36    ASSESSMENT AND PLAN:     1. Nonspecific chest pain - Seems atypical. History of nonobstructive CA. Cardiac catheterization April 2015 showed a 50% stenosis at the LAD first diagonal bifurcation but no other significant disease noted. Recent stress test  02/02/2016 was low risk study with EF of 55-65%. Echocardiogram 02/2016 with EF of 60-65%.Troponin 2 negative. EKG with nonspecific T-wave changes. CT angiogram of the chest without evidence of pulmonary embolism however suggesting pulmonary hypertension. Likely no further ischemic cardiac evaluation. We will discuss with M.D. - Consider include GI etiology as patient has a dysphagia and pending EGD.   2. Dizziness/blurred vision - Worsening.   CT of head in ER shows no acute abnormality however shows mild small vessel ischemic microangiopathy. Patient had a history of syncope and felt to be vasovagal in May 2017. - Further evaluation per primary.  3. Lower extremity edema with some skin changes. - He also has a upcoming referral to vascular surgeon for symptoms concerning for claudication. Continue lost 7 pounds on increased dose of Lasix. History of DVT and pending lower extremity Doppler during this admission.  - Loss suspicious of CHF given Normal BNP, no evidence of volume overload on exam and chest x-ray.  4. Dyslipidemia - Continue statin    5. HTN (hypertension) - Stable and well controlled. Continue current regimen.    6. Type 2 diabetes mellitus with vascular disease ([place]) - Per primary.   SignedLeanor Kail, PA 05/31/2016, 8:15 AM Pager [phone]  Co-Sign MD  Agree with note by Robbie Lis PA-C  Pt with known non obstructive CAD by cath 2 years ago. Other probs as outlined. Recent neg MV 5/17. CTA neg for PE this admission. CP improved with topical nitrate. Enz neg. EKG w/o acute changes. Will need cor angio today to sort out etiology of CP.  Lorretta Harp, M.D., [place], [facility], [place] [address] [place], [place]  [address]  [phone] 05/31/2016 10:29 AM

## 2016-05-31 NOTE — ED Notes (Signed)
Admitting MD at the bedside.  

## 2016-05-31 NOTE — Interval H&P Note (Signed)
History and Physical Interval Note:  05/31/2016 2:04 PM  ROEL SIVERS  has presented today for surgery, with the diagnosis of cp  The various methods of treatment have been discussed with the patient and family. After consideration of risks, benefits and other options for treatment, the patient has consented to  Procedure(s): Left Heart Cath and Coronary Angiography (N/A) as a surgical intervention .  The patient's history has been reviewed, patient examined, no change in status, stable for surgery.  I have reviewed the patient's chart and labs.  Questions were answered to the patient's satisfaction.   Cath Lab Visit (complete for each Cath Lab visit)  Clinical Evaluation Leading to the Procedure:   ACS: No.  Non-ACS:    Anginal Classification: CCS III  Anti-ischemic medical therapy: Minimal Therapy (1 class of medications)  Non-Invasive Test Results: No non-invasive testing performed  Prior CABG: No previous CABG        Collier Salina [facility] 05/31/2016 2:04 PM

## 2016-05-31 NOTE — Progress Notes (Signed)
Patient seen and examined, admitted this morning by Dr. Hal Hope  Briefly 55 year old male with history of nonobstructive CAD, hypertension, hyperlipidemia, diabetes, OSA on CPAP presented with chest pain. Serial troponins negative so far. EKG with no acute ST-T wave changes suggestive of ischemia Cardiology consulted, recommended cardiac cath today for ischemic evaluation   Grayton Lobo M.D. [facility] 05/31/2016, 11:20 AM  Pager: [phone]

## 2016-05-31 NOTE — ED Notes (Signed)
Attempted report x1. 

## 2016-05-31 NOTE — H&P (Signed)
History and Physical    [name] [id number] DOB: [birth year] DOA: 05/30/2016  PCP: Geannie Risen, MD  Patient coming from: Home.  Chief Complaint: Chest pain.  HPI: [name] is a 55 y.o. male with CAD status post stenting, hypertension, previous history of DVT off anticoagulants last month, diabetes mellitus type 2, chronic anemia presents to the ER with complaints of chest pain. Patient started having chest pain last afternoon. Pain started in the left lateral side of the chest which moved to his center of the chest and became more pressure-like radiating to his left arm and became persistent. Chest pain improved with pain medications and nitroglycerin in the ER. EKG was showing normal sinus rhythm with T-wave changes. And cardiac markers were unremarkable. CT angiogram of the chest was negative for PE. Patient is being admitted for further management of chest pain. Patient denies any fever chills productive cough.  ED Course: Nitroglycerin and IV Dilaudid was given.  Review of Systems: As per HPI, rest all negative.   Past Medical History:  Diagnosis Date  . Back pain   . CAD (coronary artery disease)    Nonobstructive  . Hyperlipidemia   . Hypertension   . IBS (irritable bowel syndrome)   . OSA (obstructive sleep apnea)   . Pancreatitis   . Peptic ulcer   . PUD (peptic ulcer disease)   . Type 2 diabetes mellitus ([place])     Past Surgical History:  Procedure Laterality Date  . LEFT HEART CATHETERIZATION WITH CORONARY ANGIOGRAM N/A 02/03/2014   Procedure: LEFT HEART CATHETERIZATION WITH CORONARY ANGIOGRAM;  Surgeon: Pixie Casino, MD;  Location: [facility] CATH LAB;  Service: Cardiovascular;  Laterality: N/A;     reports that he has never smoked. He has never used smokeless tobacco. He reports that he does not drink alcohol or use drugs.  Allergies  Allergen Reactions  . Ibuprofen     Made gastric ulcers worse    Family History  Problem Relation Age of  Onset  . Cancer Father   . Hypertension Mother   . Diabetes Mother   . Hypertension Brother   . Hypertension Sister     Prior to Admission medications   Medication Sig Start Date End Date Taking? Authorizing Provider  amLODipine (NORVASC) 10 MG tablet Take 1 tablet (10 mg total) by mouth daily. 06/01/14  Yes Hosie Poisson, MD  atorvastatin (LIPITOR) 80 MG tablet Take 1 tablet (80 mg total) by mouth daily. 01/06/16  Yes Lelon Perla, MD  clopidogrel (PLAVIX) 75 MG tablet Take 75 mg by mouth daily.   Yes Historical Provider, MD  famotidine (PEPCID) 20 MG tablet Take 20 mg by mouth 2 (two) times daily.   Yes Historical Provider, MD  furosemide (LASIX) 40 MG tablet Take 40 mg by mouth 2 (two) times daily.    Yes Historical Provider, MD  gabapentin (NEURONTIN) 300 MG capsule Take 300 mg by mouth See admin instructions. Take one tablet by mouth in the morning and lunch, then take two at dinner and 1 tablet at bedtime per patient   Yes Historical Provider, MD  glipiZIDE (GLUCOTROL) 10 MG tablet Take 1 tablet by mouth 2 (two) times daily. 11/13/15  Yes Historical Provider, MD  hydrALAZINE (APRESOLINE) 50 MG tablet Take 1 tablet (50 mg total) by mouth every 8 (eight) hours. Patient taking differently: Take 100 mg by mouth 2 (two) times daily.  05/29/14  Yes Hosie Poisson, MD  isosorbide dinitrate (ISORDIL) 30 MG tablet Take  30 mg by mouth at bedtime.   Yes Historical Provider, MD  lisinopril (PRINIVIL,ZESTRIL) 40 MG tablet Take 40 mg by mouth daily.   Yes Historical Provider, MD  metFORMIN (GLUCOPHAGE) 500 MG tablet Take 500 mg by mouth 2 (two) times daily with a meal.   Yes Historical Provider, MD  metoprolol (LOPRESSOR) 50 MG tablet Take 1.5 tablets (75 mg total) by mouth 2 (two) times daily. Patient taking differently: Take 50 mg by mouth 2 (two) times daily.  01/06/16  Yes Lelon Perla, MD  Potassium Chloride ER (K-TAB) 20 MEQ TBCR Take 1 tablet by mouth daily.  11/10/15  Yes Historical Provider, MD   promethazine (PHENERGAN) 25 MG tablet Take 25 mg by mouth every 6 (six) hours as needed for nausea or vomiting.   Yes Historical Provider, MD  sertraline (ZOLOFT) 50 MG tablet Take 50 mg by mouth daily.   Yes Historical Provider, MD  sitaGLIPtin (JANUVIA) 100 MG tablet Take 100 mg by mouth daily.   Yes Historical Provider, MD  traZODone (DESYREL) 50 MG tablet Take 50 mg by mouth at bedtime.   Yes Historical Provider, MD  oxyCODONE-acetaminophen (PERCOCET/ROXICET) 5-325 MG per tablet Take 1 tablet by mouth every 4 (four) hours as needed for severe pain. Patient not taking: Reported on 05/30/2016 06/01/14   Hosie Poisson, MD    Physical Exam: Vitals:   05/30/16 2245 05/30/16 2330 05/31/16 0000 05/31/16 0017  BP: 128/68 123/64 111/67 108/71  Pulse: 69 62 66 66  Resp: 14 15 14 18   Temp:      TempSrc:      SpO2: 95% 92% 94% 97%      Constitutional: Not in distress. Vitals:   05/30/16 2245 05/30/16 2330 05/31/16 0000 05/31/16 0017  BP: 128/68 123/64 111/67 108/71  Pulse: 69 62 66 66  Resp: 14 15 14 18   Temp:      TempSrc:      SpO2: 95% 92% 94% 97%   Eyes: Anicteric. No pallor. ENMT: No discharge from the ears eyes nose and mouth. Neck: No mass felt. No neck rigidity. Respiratory: No rhonchi or crepitations. Cardiovascular: S1 and S2 heard. Abdomen: Soft nontender bowel sounds present. No guarding or rigidity. Musculoskeletal: No edema. Skin: No rash. Neurologic: Alert awake oriented to time place and person. Moves all extremities. Psychiatric: Appears normal.   Labs on Admission: I have personally reviewed following labs and imaging studies  CBC:  Recent Labs Lab 05/30/16 1813  WBC 10.2  HGB 12.5*  HCT 38.7*  MCV 91.3  PLT [id number]   Basic Metabolic Panel:  Recent Labs Lab 05/30/16 1813  NA 136  K 3.7  CL 97*  CO2 27  GLUCOSE 209*  BUN 9  CREATININE 0.76  CALCIUM 8.7*   GFR: CrCl cannot be calculated (Unknown ideal weight.). Liver Function Tests: No results  for input(s): AST, ALT, ALKPHOS, BILITOT, PROT, ALBUMIN in the last 168 hours. No results for input(s): LIPASE, AMYLASE in the last 168 hours. No results for input(s): AMMONIA in the last 168 hours. Coagulation Profile: No results for input(s): INR, PROTIME in the last 168 hours. Cardiac Enzymes: No results for input(s): CKTOTAL, CKMB, CKMBINDEX, TROPONINI in the last 168 hours. BNP (last 3 results) No results for input(s): PROBNP in the last 8760 hours. HbA1C: No results for input(s): HGBA1C in the last 72 hours. CBG: No results for input(s): GLUCAP in the last 168 hours. Lipid Profile: No results for input(s): CHOL, HDL, LDLCALC, TRIG, CHOLHDL, LDLDIRECT in  the last 72 hours. Thyroid Function Tests: No results for input(s): TSH, T4TOTAL, FREET4, T3FREE, THYROIDAB in the last 72 hours. Anemia Panel: No results for input(s): VITAMINB12, FOLATE, FERRITIN, TIBC, IRON, RETICCTPCT in the last 72 hours. Urine analysis:    Component Value Date/Time   COLORURINE YELLOW 05/27/2014 2312   APPEARANCEUR CLOUDY (A) 05/27/2014 2312   LABSPEC 1.023 05/27/2014 2312   PHURINE 5.5 05/27/2014 2312   GLUCOSEU >1000 (A) 05/27/2014 2312   HGBUR NEGATIVE 05/27/2014 2312   BILIRUBINUR NEGATIVE 05/27/2014 2312   KETONESUR 15 (A) 05/27/2014 2312   PROTEINUR NEGATIVE 05/27/2014 2312   UROBILINOGEN 0.2 05/27/2014 2312   NITRITE NEGATIVE 05/27/2014 2312   LEUKOCYTESUR NEGATIVE 05/27/2014 2312   Sepsis Labs: @LABRCNTIP (procalcitonin:4,lacticidven:4) )No results found for this or any previous visit (from the past 240 hour(s)).   Radiological Exams on Admission: Dg Chest 2 View  Result Date: 05/30/2016 CLINICAL DATA:  Chest pain, shortness of Breath starting today EXAM: CHEST  2 VIEW COMPARISON:  05/23/2016 FINDINGS: Cardiomediastinal silhouette is stable. No acute infiltrate or pulmonary edema. Degenerative changes thoracic spine. IMPRESSION: No active cardiopulmonary disease. Electronically Signed   By:  Lahoma Crocker M.D.   On: 05/30/2016 18:19   Ct Head Wo Contrast  Result Date: 05/30/2016 CLINICAL DATA:  Acute onset of headache and blurred vision. Initial encounter. EXAM: CT HEAD WITHOUT CONTRAST TECHNIQUE: Contiguous axial images were obtained from the base of the skull through the vertex without intravenous contrast. COMPARISON:  CT of the head performed 03/21/2016 FINDINGS: There is no evidence of acute infarction, mass lesion, or intra- or extra-axial hemorrhage on CT. Mild periventricular and subcortical white matter change likely reflects small vessel ischemic microangiopathy. The posterior fossa, including the cerebellum, brainstem and fourth ventricle, is within normal limits. The third and lateral ventricles, and basal ganglia are unremarkable in appearance. The cerebral hemispheres are symmetric in appearance, with normal gray-white differentiation. No mass effect or midline shift is seen. There is no evidence of fracture; visualized osseous structures are unremarkable in appearance. The orbits are within normal limits. The paranasal sinuses and mastoid air cells are well-aerated. No significant soft tissue abnormalities are seen. IMPRESSION: 1. No acute intracranial pathology seen on CT. 2. Mild small vessel ischemic microangiopathy. Electronically Signed   By: Garald Balding M.D.   On: 05/30/2016 21:29   Ct Angio Chest Pe W And/or Wo Contrast  Result Date: 05/30/2016 CLINICAL DATA:  Left-sided chest pain today. EXAM: CT ANGIOGRAPHY CHEST WITH CONTRAST TECHNIQUE: Multidetector CT imaging of the chest was performed using the standard protocol during bolus administration of intravenous contrast. Multiplanar CT image reconstructions and MIPs were obtained to evaluate the vascular anatomy. CONTRAST:  100 cc Isovue 370 COMPARISON:  Chest CT 04/18/2015 FINDINGS: Chest wall: No chest wall mass, supraclavicular or axillary lymphadenopathy. Mild gynecomastia, stable. Cardiovascular: The heart is mildly  enlarged but stable. No pericardial effusion. The aorta is normal in caliber. No dissection. The branch vessels are patent. Stable pulmonary artery enlargement suggesting pulmonary hypertension. No filling defects to suggest pulmonary embolism. Mediastinum/Nodes: No mediastinal or hilar mass or adenopathy. The esophagus is grossly normal. Lungs/Pleura: Dependent subpleural atelectasis but no infiltrates, edema or effusions. Upper Abdomen: Diffuse fatty infiltration of the liver but no hepatic lesions. No upper abdominal mass or adenopathy. Stable small scattered celiac axis and periportal lymph nodes. Musculoskeletal: No significant bony findings. Stable changes of DISH. Review of the MIP images confirms the above findings. IMPRESSION: 1. No CT findings for pulmonary embolism. 2. Normal  thoracic aorta. 3. Stable central pulmonary artery enlargement suggesting pulmonary hypertension. 4. No significant pulmonary findings. 5. Diffuse fatty infiltration of the liver. Electronically Signed   By: Marijo Sanes M.D.   On: 05/30/2016 21:36    EKG: Independently reviewed. Normal sinus rhythm with nonspecific T-wave changes.  Assessment/Plan Principal Problem:   Nonspecific chest pain Active Problems:   Dyslipidemia   HTN (hypertension)   Type 2 diabetes mellitus with vascular disease (HCC)   Normocytic normochromic anemia    1. Chest pain given the history of CAD status post stenting concerning for unstable angina - continue with nitroglycerin patch. Patient is on beta blockers and Plavix statins. Patient has had stress tests in April of this year which was unremarkable. Check 2-D echo. Consult cardiologist in a.m. 2. Hypertension - patient is on hydralazine and lisinopril and beta blockers. 3. Diabetes mellitus type 2 - patient is placed on sliding scale coverage.  4. Chronic anemia - follow CBC. 5. Increasing lower extremity edema - patient's Lasix dose was recently increased. Closely follow intake  output metabolic panel daily weights and 2-D echo. Check Dopplers. 6. Chronic anemia - follow CBC. 7. History of DVT - CT angiogram was negative for PE. Check Dopplers. Due to recent increase in lower extremity edema.   DVT prophylaxis: Lovenox. Code Status: Full code.  Family Communication: Discussed with patient.  Disposition Plan: Home.  Consults called: None.  Admission status: Observation. Telemetry.    Rise Patience MD [facility] Pager [phone].  If 7PM-7AM, please contact night-coverage [email or web address] Password [email or web address]  05/31/2016, 12:42 AM

## 2016-05-31 NOTE — ED Notes (Addendum)
Attempted to draw troponin without success.

## 2016-05-31 NOTE — H&P (View-Only) (Signed)
CARDIOLOGY CONSULT NOTE   Patient ID: [name] MRN: [id number] DOB/AGE: 29-Jun-1961 55 y.o.  Admit date: 05/30/2016  Primary Physician   Geannie Risen, MD Primary Cardiologist   Dr. Stanford Breed Reason for Consultation   Chest pain  Requesting Physician  Dr. Tana Coast  HPI: [name] is a 55 y.o. male with a history of non-obstructive coronary artery disease, hypertension, hyperlipidemia, peptic ulcer disease, diabetes, DVT (off anticoagulation last month), and OSA on CPAP who presented to [facility] ER for evaluation of chest pain.  Nuclear 11/2013 in [facility] showed normal perfusion and ejection fraction 47%. Cardiac catheterization April 2015 showed a 50% stenosis at the LAD first diagonal bifurcation but no other significant disease noted. Last seen by Dr. Stanford Breed 01/06/16 for atypical chest pain. BB increased due to elevated blood pressure. Follow-up stress test 02/02/2016 was low risk study with EF of 55-65%.   The patient had a syncope episode 03/21/2016-->  felt vasovagal. Followed by Dr. Tonia Ghent @ [facility]. Echo 02/2016 with EF of 60-65%. Carotid doppler 03/21/16 showed bilateral 40-59% stenosis in the internal carotid arteries. Seen by GI for dysphagia  05/09/2016 by Dr. Dorrene German. Pending EGD 06/08/16. He also has a upcoming referral to vascular surgeon for symptoms concerning for claudication.  Study patient had a sudden onset of left axilla sharp stabbing pain while sitting. The pain radiated to to left anterior chest then left neck and then to left shoulder and arm. Then patient had a dull achy chest pain. Is to having intermittent shortness of breath and lower extremity edema. Recently increased Lasix with good diuresis. The patient had a episode of severe dizziness and headache while in the ER. Few days ago patient had a episode of diarrhea and nausea and felt likely due to IBS. Chest discomfort somewhat improved on nitroglycerin and IV dilaudid in the ER.  Troponin x 2  negative. Point-of-care troponin negative. BNP within normal limits. Creatinine normal. Hemoglobin of 11.6. EKG shows sinus rhythm at a rate of 75 bpm nonspecific T-wave abnormality in inferior lateral lead which is somewhat more pronounced than previous EKG of 01/06/16. CT angiogram of chest showed no evidence of pulmonary embolism, diffuse fatty liver, pulmonary artery enlargement suggests pulmonary hypertension. CT of head in ER shows no acute abnormality however shows mild small vessel ischemic microangiopathy.  Past Medical History:  Diagnosis Date  . Back pain   . CAD (coronary artery disease)    Nonobstructive  . Hyperlipidemia   . Hypertension   . IBS (irritable bowel syndrome)   . OSA (obstructive sleep apnea)   . Pancreatitis   . Peptic ulcer   . PUD (peptic ulcer disease)   . Type 2 diabetes mellitus ([place])      Past Surgical History:  Procedure Laterality Date  . LEFT HEART CATHETERIZATION WITH CORONARY ANGIOGRAM N/A 02/03/2014   Procedure: LEFT HEART CATHETERIZATION WITH CORONARY ANGIOGRAM;  Surgeon: Pixie Casino, MD;  Location: [facility] CATH LAB;  Service: Cardiovascular;  Laterality: N/A;    Allergies  Allergen Reactions  . Ibuprofen     Made gastric ulcers worse    I have reviewed the patient's current medications . amLODipine  10 mg Oral Daily  . atorvastatin  80 mg Oral Daily  . clopidogrel  75 mg Oral Daily  . enoxaparin (LOVENOX) injection  40 mg Subcutaneous Q24H  . famotidine  20 mg Oral BID  . furosemide  40 mg Oral BID  . gabapentin  300 mg Oral 3 times per  day  . gabapentin  600 mg Oral Q supper  . glipiZIDE  10 mg Oral BID WC  . hydrALAZINE  100 mg Oral BID  . insulin aspart  0-9 Units Subcutaneous TID WC  . isosorbide mononitrate  30 mg Oral QHS  . linagliptin  5 mg Oral Daily  . lisinopril  40 mg Oral Daily  . metoprolol  50 mg Oral BID  . potassium chloride SA  20 mEq Oral Daily  . sertraline  50 mg Oral Daily  . traZODone  50 mg Oral QHS       acetaminophen, morphine injection, ondansetron (ZOFRAN) IV  Prior to Admission medications   Medication Sig Start Date End Date Taking? Authorizing Provider  amLODipine (NORVASC) 10 MG tablet Take 1 tablet (10 mg total) by mouth daily. 06/01/14  Yes Hosie Poisson, MD  atorvastatin (LIPITOR) 80 MG tablet Take 1 tablet (80 mg total) by mouth daily. 01/06/16  Yes Lelon Perla, MD  clopidogrel (PLAVIX) 75 MG tablet Take 75 mg by mouth daily.   Yes Historical Provider, MD  famotidine (PEPCID) 20 MG tablet Take 20 mg by mouth 2 (two) times daily.   Yes Historical Provider, MD  furosemide (LASIX) 40 MG tablet Take 40 mg by mouth 2 (two) times daily.    Yes Historical Provider, MD  gabapentin (NEURONTIN) 300 MG capsule Take 300 mg by mouth See admin instructions. Take one tablet by mouth in the morning and lunch, then take two at dinner and 1 tablet at bedtime per patient   Yes Historical Provider, MD  glipiZIDE (GLUCOTROL) 10 MG tablet Take 1 tablet by mouth 2 (two) times daily. 11/13/15  Yes Historical Provider, MD  hydrALAZINE (APRESOLINE) 50 MG tablet Take 1 tablet (50 mg total) by mouth every 8 (eight) hours. Patient taking differently: Take 100 mg by mouth 2 (two) times daily.  05/29/14  Yes Hosie Poisson, MD  isosorbide dinitrate (ISORDIL) 30 MG tablet Take 30 mg by mouth at bedtime.   Yes Historical Provider, MD  lisinopril (PRINIVIL,ZESTRIL) 40 MG tablet Take 40 mg by mouth daily.   Yes Historical Provider, MD  metFORMIN (GLUCOPHAGE) 500 MG tablet Take 500 mg by mouth 2 (two) times daily with a meal.   Yes Historical Provider, MD  metoprolol (LOPRESSOR) 50 MG tablet Take 1.5 tablets (75 mg total) by mouth 2 (two) times daily. Patient taking differently: Take 50 mg by mouth 2 (two) times daily.  01/06/16  Yes Lelon Perla, MD  Potassium Chloride ER (K-TAB) 20 MEQ TBCR Take 1 tablet by mouth daily.  11/10/15  Yes Historical Provider, MD  promethazine (PHENERGAN) 25 MG tablet Take 25 mg by mouth  every 6 (six) hours as needed for nausea or vomiting.   Yes Historical Provider, MD  sertraline (ZOLOFT) 50 MG tablet Take 50 mg by mouth daily.   Yes Historical Provider, MD  sitaGLIPtin (JANUVIA) 100 MG tablet Take 100 mg by mouth daily.   Yes Historical Provider, MD  traZODone (DESYREL) 50 MG tablet Take 50 mg by mouth at bedtime.   Yes Historical Provider, MD  oxyCODONE-acetaminophen (PERCOCET/ROXICET) 5-325 MG per tablet Take 1 tablet by mouth every 4 (four) hours as needed for severe pain. Patient not taking: Reported on 05/30/2016 06/01/14   Hosie Poisson, MD     Social History   Social History  . Marital status: Single    Spouse name: N/A  . Number of children: 3  . Years of education: 10  Occupational History  . Scientist, product/process development   Social History Main Topics  . Smoking status: Never Smoker  . Smokeless tobacco: Never Used  . Alcohol use No  . Drug use: No  . Sexual activity: Not on file   Other Topics Concern  . Not on file   Social History Narrative  . No narrative on file    Family Status  Relation Status  . Father Deceased at age 39   cancer  . Mother   . Brother   . Sister    Family History  Problem Relation Age of Onset  . Cancer Father   . Hypertension Mother   . Diabetes Mother   . Hypertension Brother   . Hypertension Sister       ROS:  Full 14 point review of systems complete and found to be negative unless listed above.  Physical Exam: Blood pressure 115/64, pulse 64, temperature 97.9 F (36.6 C), temperature source Oral, resp. rate 18, height 5\' 10"  (1.778 m), weight (!) 323 lb 12.8 oz (146.9 kg), SpO2 98 %.  General: Well developed, well nourished, obsess  male in no acute distress Head: Eyes PERRLA, No xanthomas. Normocephalic and atraumatic, oropharynx without edema or exudate.  Lungs: Resp regular and unlabored, CTA. Heart: RRR no s3, s4, or murmurs..   Neck: No carotid bruits. No lymphadenopathy.  No JVD. Abdomen: Bowel  sounds present, abdomen soft and non-tender without masses or hernias noted. Msk:  No spine or cva tenderness. No weakness, no joint deformities or effusions. Extremities: No clubbing, cyanosis. 1+ BL LE  Edema with chronic skin changes. DP/PT/Radials 2+ and equal bilaterally. Neuro: Alert and oriented X 3. No focal deficits noted. Psych:  Good affect, responds appropriately Skin: No rashes or lesions noted.  Labs:   Lab Results  Component Value Date   WBC 8.4 05/31/2016   HGB 11.6 (L) 05/31/2016   HCT 35.9 (L) 05/31/2016   MCV 91.1 05/31/2016   PLT 178 05/31/2016   No results for input(s): INR in the last 72 hours.  Recent Labs Lab 05/30/16 1813 05/31/16 0050  NA 136  --   K 3.7  --   CL 97*  --   CO2 27  --   BUN 9  --   CREATININE 0.76 0.74  CALCIUM 8.7*  --   GLUCOSE 209*  --    No results found for: MG  Recent Labs  05/31/16 0050 05/31/16 0114  TROPONINI <0.03 <0.03    Recent Labs  05/30/16 1815  TROPIPOC 0.00   No results found for: PROBNP Lab Results  Component Value Date   CHOL 88 (L) 02/01/2016   HDL 27 (L) 02/01/2016   LDLCALC 5 02/01/2016   TRIG 281 (H) 02/01/2016     Echo: 02/2016 Conclusions Summary Normal left ventricular size and systolic function with no appreciable segmental abnormality. Ejection fraction is visually estimated at 60-65% Mild concentric left ventricular hypertrophy Mild tricuspid regurgitation Mild mitral regurgitation Signature ------------------------------------------------------------------------------  Electronically signed by Rozann Lesches, MD(Interpreting physician) on 03/22/2016  02:29 PM ------------------------------------------------------------------------------ Findings Mitral Valve Structurally normal mitral valve with good mobility and no significant regurgitation by color flow doppler examination. Aortic Valve Structurally normal aortic valve with good leaflet mobility, and no regurgitation by color  flow doppler examination. Tricuspid Valve Tricuspid valve is structurally normal. No significant tricuspid regurgitation by color flow doppler examination. Pulmonic Valve Pulmonic valve is structurally normal. No Doppler evidence of pulmonic stenosis or insufficiency. Left Atrium Normal  size left atrium. Left Ventricle Mild concentric left ventricular hypertrophy Ejection fraction is visually estimated at 60-65% Normal left ventricular size and systolic function with no appreciable segmental abnormality. Right Atrium Normal right atrium. Right Ventricle Normal right ventricular size and function. Pericardial Effusion No evidence of pericardial effusion. Miscellaneous The aorta is within normal limits. The IVC is dilated M-Mode/2D Measurements & Calculations  LV Diastolic Dimension:LV Systolic Dimension: LA Dimension: 3.9 cmAO  5.12 cm2.93 cmRoot Dimension: 3.6 cm  LV FS:42.77 %LV Volume Diastolic: [id number]  LV PW Diastolic: 123456 cm ml  Septum Diastolic: [id number] LV Volume Systolic: [id number]  cm ml LV EDV/LV EDV Index: 85.2LA/Aorta: 1.08 ml/31 m2LV ESV/LV ESV Index: 35.9 ml/13 m2  LV Area Diastolic: 123456 Calculated: 57.86 %  cm2  LV Area Systolic: 18 cm2 LV Length: 9.4 cm Doppler Measurements & Calculations  MV Peak E-Wave: 104 cm/s  MV Peak A-Wave: 73.2 cm/s  MV E/A Ratio: 1.42  MV Peak Gradient: 4.33 mmHg  MV Deceleration Time: 151 msec  TDI E' Velocity: 10.4 cm/s  E/E' prime 10  ECG:  sinus rhythm at a rate of 75 bpm nonspecific T-wave abnormality in inferior lateral lead which is somewhat more pronounced than previous EKG of 01/06/16.   Radiology:  Dg Chest 2 View  Result Date: 05/30/2016 CLINICAL DATA:  Chest pain, shortness of Breath starting today EXAM: CHEST  2 VIEW COMPARISON:  05/23/2016 FINDINGS:  Cardiomediastinal silhouette is stable. No acute infiltrate or pulmonary edema. Degenerative changes thoracic spine. IMPRESSION: No active cardiopulmonary disease. Electronically Signed   By: Lahoma Crocker M.D.   On: 05/30/2016 18:19   Ct Head Wo Contrast  Result Date: 05/30/2016 CLINICAL DATA:  Acute onset of headache and blurred vision. Initial encounter. EXAM: CT HEAD WITHOUT CONTRAST TECHNIQUE: Contiguous axial images were obtained from the base of the skull through the vertex without intravenous contrast. COMPARISON:  CT of the head performed 03/21/2016 FINDINGS: There is no evidence of acute infarction, mass lesion, or intra- or extra-axial hemorrhage on CT. Mild periventricular and subcortical white matter change likely reflects small vessel ischemic microangiopathy. The posterior fossa, including the cerebellum, brainstem and fourth ventricle, is within normal limits. The third and lateral ventricles, and basal ganglia are unremarkable in appearance. The cerebral hemispheres are symmetric in appearance, with normal gray-white differentiation. No mass effect or midline shift is seen. There is no evidence of fracture; visualized osseous structures are unremarkable in appearance. The orbits are within normal limits. The paranasal sinuses and mastoid air cells are well-aerated. No significant soft tissue abnormalities are seen. IMPRESSION: 1. No acute intracranial pathology seen on CT. 2. Mild small vessel ischemic microangiopathy. Electronically Signed   By: Garald Balding M.D.   On: 05/30/2016 21:29   Ct Angio Chest Pe W And/or Wo Contrast  Result Date: 05/30/2016 CLINICAL DATA:  Left-sided chest pain today. EXAM: CT ANGIOGRAPHY CHEST WITH CONTRAST TECHNIQUE: Multidetector CT imaging of the chest was performed using the standard protocol during bolus administration of intravenous contrast. Multiplanar CT image reconstructions and MIPs were obtained to evaluate the vascular anatomy. CONTRAST:  100 cc Isovue  370 COMPARISON:  Chest CT 04/18/2015 FINDINGS: Chest wall: No chest wall mass, supraclavicular or axillary lymphadenopathy. Mild gynecomastia, stable. Cardiovascular: The heart is mildly enlarged but stable. No pericardial effusion. The aorta is normal in caliber. No dissection. The branch vessels are patent. Stable pulmonary artery enlargement suggesting pulmonary hypertension. No filling defects to suggest pulmonary embolism. Mediastinum/Nodes: No mediastinal or hilar mass or adenopathy. The  esophagus is grossly normal. Lungs/Pleura: Dependent subpleural atelectasis but no infiltrates, edema or effusions. Upper Abdomen: Diffuse fatty infiltration of the liver but no hepatic lesions. No upper abdominal mass or adenopathy. Stable small scattered celiac axis and periportal lymph nodes. Musculoskeletal: No significant bony findings. Stable changes of DISH. Review of the MIP images confirms the above findings. IMPRESSION: 1. No CT findings for pulmonary embolism. 2. Normal thoracic aorta. 3. Stable central pulmonary artery enlargement suggesting pulmonary hypertension. 4. No significant pulmonary findings. 5. Diffuse fatty infiltration of the liver. Electronically Signed   By: Marijo Sanes M.D.   On: 05/30/2016 21:36    ASSESSMENT AND PLAN:     1. Nonspecific chest pain - Seems atypical. History of nonobstructive CA. Cardiac catheterization April 2015 showed a 50% stenosis at the LAD first diagonal bifurcation but no other significant disease noted. Recent stress test  02/02/2016 was low risk study with EF of 55-65%. Echocardiogram 02/2016 with EF of 60-65%.Troponin 2 negative. EKG with nonspecific T-wave changes. CT angiogram of the chest without evidence of pulmonary embolism however suggesting pulmonary hypertension. Likely no further ischemic cardiac evaluation. We will discuss with M.D. - Consider include GI etiology as patient has a dysphagia and pending EGD.   2. Dizziness/blurred vision - Worsening.   CT of head in ER shows no acute abnormality however shows mild small vessel ischemic microangiopathy. Patient had a history of syncope and felt to be vasovagal in May 2017. - Further evaluation per primary.  3. Lower extremity edema with some skin changes. - He also has a upcoming referral to vascular surgeon for symptoms concerning for claudication. Continue lost 7 pounds on increased dose of Lasix. History of DVT and pending lower extremity Doppler during this admission.  - Loss suspicious of CHF given Normal BNP, no evidence of volume overload on exam and chest x-ray.  4. Dyslipidemia - Continue statin    5. HTN (hypertension) - Stable and well controlled. Continue current regimen.    6. Type 2 diabetes mellitus with vascular disease ([place]) - Per primary.   SignedLeanor Kail, PA 05/31/2016, 8:15 AM Pager [phone]  Co-Sign MD  Agree with note by Robbie Lis PA-C  Pt with known non obstructive CAD by cath 2 years ago. Other probs as outlined. Recent neg MV 5/17. CTA neg for PE this admission. CP improved with topical nitrate. Enz neg. EKG w/o acute changes. Will need cor angio today to sort out etiology of CP.  Lorretta Harp, M.D., [place], [facility], [place] [address] [place], [place]  [address]  [phone] 05/31/2016 10:29 AM

## 2016-06-01 ENCOUNTER — Inpatient Hospital Stay (HOSPITAL_COMMUNITY): Payer: Medicaid Other

## 2016-06-01 ENCOUNTER — Encounter (HOSPITAL_COMMUNITY): Payer: Self-pay | Admitting: Cardiology

## 2016-06-01 ENCOUNTER — Telehealth: Payer: Self-pay | Admitting: Cardiology

## 2016-06-01 DIAGNOSIS — R079 Chest pain, unspecified: Secondary | ICD-10-CM

## 2016-06-01 DIAGNOSIS — R600 Localized edema: Secondary | ICD-10-CM

## 2016-06-01 DIAGNOSIS — R609 Edema, unspecified: Secondary | ICD-10-CM

## 2016-06-01 DIAGNOSIS — I209 Angina pectoris, unspecified: Secondary | ICD-10-CM

## 2016-06-01 LAB — ECHOCARDIOGRAM COMPLETE
Ao-asc: 41 cm
E decel time: 313 msec
FS: 39 % (ref 28–44)
Height: 70 in
IVS/LV PW RATIO, ED: 1.17
LA ID, A-P, ES: 34 mm
LA diam end sys: 34 mm
LA diam index: 1.23 cm/m2
LA vol A4C: 65.5 ml
LV PW d: 12 mm — AB (ref 0.6–1.1)
LV dias vol index: 46 mL/m2
LV dias vol: 128 mL (ref 62–150)
LV e' LATERAL: 10.9 cm/s
LV sys vol index: 19 mL/m2
LV sys vol: 53 mL (ref 21–61)
LVOT SV: 108 mL
LVOT VTI: 34.3 cm
LVOT area: 3.14 cm2
LVOT diameter: 20 mm
LVOT peak grad rest: 9 mmHg
LVOT peak vel: 151 cm/s
Lateral S' vel: 25.7 cm/s
MV Dec: 313
MV pk E vel: 1.2 m/s
Simpson's disk: 59
Stroke v: 75 ml
TAPSE: 34.9 mm
TDI e' lateral: 10.9
TDI e' medial: 6.74
Weight: 5149.95 oz

## 2016-06-01 LAB — CBC
HCT: 35.6 % — ABNORMAL LOW (ref 39.0–52.0)
Hemoglobin: 11.2 g/dL — ABNORMAL LOW (ref 13.0–17.0)
MCH: 29.3 pg (ref 26.0–34.0)
MCHC: 31.5 g/dL (ref 30.0–36.0)
MCV: 93.2 fL (ref 78.0–100.0)
Platelets: 218 10*3/uL (ref 150–400)
RBC: 3.82 MIL/uL — ABNORMAL LOW (ref 4.22–5.81)
RDW: 14.6 % (ref 11.5–15.5)
WBC: 9.1 10*3/uL (ref 4.0–10.5)

## 2016-06-01 LAB — GLUCOSE, CAPILLARY
Glucose-Capillary: 233 mg/dL — ABNORMAL HIGH (ref 65–99)
Glucose-Capillary: 240 mg/dL — ABNORMAL HIGH (ref 65–99)

## 2016-06-01 LAB — BASIC METABOLIC PANEL
Anion gap: 11 (ref 5–15)
BUN: 8 mg/dL (ref 6–20)
CO2: 30 mmol/L (ref 22–32)
Calcium: 8.1 mg/dL — ABNORMAL LOW (ref 8.9–10.3)
Chloride: 96 mmol/L — ABNORMAL LOW (ref 101–111)
Creatinine, Ser: 0.72 mg/dL (ref 0.61–1.24)
GFR calc Af Amer: >60 mL/min (ref >60)
GFR calc non Af Amer: >60 mL/min (ref >60)
Glucose, Bld: 248 mg/dL — ABNORMAL HIGH (ref 65–99)
Potassium: 3.3 mmol/L — ABNORMAL LOW (ref 3.5–5.1)
Sodium: 137 mmol/L (ref 135–145)

## 2016-06-01 MED ORDER — ASPIRIN 81 MG PO CHEW: 81.0000 mg | CHEWABLE_TABLET | Freq: Every day | ORAL | 0 refills | Status: DC

## 2016-06-01 MED ORDER — FUROSEMIDE 40 MG PO TABS: 80.0000 mg | ORAL_TABLET | Freq: Two times a day (BID) | ORAL | Status: DC

## 2016-06-01 MED ORDER — NITROGLYCERIN 0.4 MG SL SUBL
SUBLINGUAL_TABLET | SUBLINGUAL | Status: AC
  Filled 2016-06-01: qty 2

## 2016-06-01 MED ORDER — METOPROLOL TARTRATE 50 MG PO TABS: 50.0000 mg | ORAL_TABLET | Freq: Two times a day (BID) | ORAL | 0 refills | Status: DC

## 2016-06-01 MED ORDER — FUROSEMIDE 80 MG PO TABS: 80.0000 mg | ORAL_TABLET | Freq: Two times a day (BID) | ORAL | 0 refills | Status: DC

## 2016-06-01 NOTE — Telephone Encounter (Signed)
TOC Phone Call .Marland Kitchen Appt is on 06/13/16 at 9:30am w/ Almyra Deforest at the [facility]

## 2016-06-01 NOTE — Progress Notes (Signed)
Inpatient Diabetes Program Recommendations  AACE/ADA: New Consensus Statement on Inpatient Glycemic Control (2015)  Target Ranges:  Prepandial:   less than 140 mg/dL      Peak postprandial:   less than 180 mg/dL (1-2 hours)      Critically ill patients:  140 - 180 mg/dL   Results for TRISTAIN, GOTTSHALL (MRN [id number]) as of 06/01/2016 10:43  Ref. Range 05/31/2016 07:33 05/31/2016 11:38 05/31/2016 15:49 05/31/2016 21:12 06/01/2016 07:08  Glucose-Capillary Latest Ref Range: 65 - 99 mg/dL 235 (H) 197 (H) 171 (H) 299 (H) 233 (H)    Review of Glycemic Control  Diabetes history: DM2 Outpatient Diabetes medications: Glipizide 10 BID, Metformin 500 BID, Januvia 100 QD Current orders for Inpatient glycemic control: Glipizide 10 BID, Tradjenta 5 QD, Novolog correction 0-9 units tid  Inpatient Diabetes Program Recommendations:  Please consider A1c to determine prehospital glycemic control and add Novolog correction 0-5 units hs.  Thank you, Nani Gasser. Gunnison Chahal, RN, MSN, CDE Inpatient Glycemic Control Team Team Pager [phone] (8am-5pm) 06/01/2016 11:01 AM

## 2016-06-01 NOTE — Discharge Summary (Signed)
Physician Discharge Summary  SCOTTE GILDEA [id number] DOB: [birth year] DOA: 05/30/2016  PCP: Geannie Risen, MD  Admit date: 05/30/2016 Discharge date: 06/01/2016  Time spent: 35 minutes  Recommendations for Outpatient Follow-up:  1. Cardiology and Vascular follow-up   Discharge Diagnoses:  Principal Problem:   Nonspecific chest pain Active Problems:   Dyslipidemia   HTN (hypertension)   Type 2 diabetes mellitus with vascular disease (HCC)   Normocytic normochromic anemia   Chest pain   Discharge Condition: stable  Diet recommendation: low cal, DM, low salt, hearthealthy  Filed Weights   05/31/16 0118 05/31/16 0433 06/01/16 0400  Weight: (!) 147.8 kg (325 lb 14.4 oz) (!) 146.9 kg (323 lb 12.8 oz) (!) 146 kg (321 lb 14 oz)    History of present illness:  [name] is a 55 y.o. male with CAD status post stenting, hypertension, previous history of DVT off anticoagulants last month, diabetes mellitus type 2, chronic anemia presents to the ER with complaints of chest pain. Patient started having chest pain last afternoon. Pain started in the left lateral side of the chest which moved to his center of the chest and became more pressure-like radiating to his left arm and became persistent. Chest pain improved with pain medications and nitroglycerin in the ER. EKG was showing normal sinus rhythm with T-wave changes. And cardiac markers were unremarkable. CT angiogram of the chest was negative for PE. Patient is being admitted for further management of chest pain. Patient denies any fever chills productive cough.  Hospital Course:  Pt admitted for CP rule out. Neg serial trop. CP better with nitrate. No PE on CTA.  DES placed in LAD which was 70% stenosed. D/c on DAPT.   Procedures: 8/9 Study Conclusions  - Left ventricle: The cavity size was normal. There was mild   concentric hypertrophy. Systolic function was normal. The   estimated ejection fraction was in the range  of 55% to 60%. Wall   motion was normal; there were no regional wall motion   abnormalities. Left ventricular diastolic function parameters   were normal. - Aortic valve: Transvalvular velocity was within the normal range.   There was no stenosis. There was no regurgitation. Valve area   (VTI): 2.67 cm^2. Valve area (Vmax): 2.23 cm^2. Valve area   (Vmean): 2.09 cm^2. - Mitral valve: Transvalvular velocity was within the normal range.   There was no evidence for stenosis. There was trivial   regurgitation. - Right ventricle: The cavity size was normal. Wall thickness was   normal. Systolic function was normal. - Atrial septum: No defect or patent foramen ovale was identified   by color flow Doppler. - Tricuspid valve: There was no regurgitation  8/8   Mid RCA lesion, 25 %stenosed.  A STENT SYNERGY DES 4X16 drug eluting stent was successfully placed.  Mid LAD lesion, 70 %stenosed.  Post intervention, there is a 0% residual stenosis.   1. Single vessel obstructive CAD. 70% mid LAD with abnormal FFR of 0.67.  2. Successful stenting of the mid LAD with DES 3. Elevated LVEDP  Plan: DAPT for one year. Anticipate DC in am.   Consultations:  Cardio  Discharge Exam: Vitals:   06/01/16 0800 06/01/16 0805  BP:  (!) 150/65  Pulse:  70  Resp: (!) 23 16  Temp:  98 F (36.7 C)     General:  No diaphoresis, anxious, no acute distress  Cardiovascular: Regular rate and rhythm no murmurs rubs or gallops  Respiratory: Clear to  auscultation bilaterally no more breathing  Abdomen: Nondistended bowel sounds normal nontender palpation  Musculoskeletal: Moving all extremities, no deformity, 5 out of 5 strength    Discharge Instructions   Discharge Instructions    AMB Referral to Cardiac Rehabilitation - Phase II    Complete by:  As directed   Diagnosis:  Coronary Stents   Amb Referral to Cardiac Rehabilitation    Complete by:  As directed   Diagnosis:  Coronary Stents  Comment - to [place]     Discharge Medication List as of 06/01/2016  4:05 PM    START taking these medications   Details  aspirin 81 MG chewable tablet Chew 1 tablet (81 mg total) by mouth daily., Starting Wed 06/01/2016, Normal      CONTINUE these medications which have CHANGED   Details  furosemide (LASIX) 80 MG tablet Take 1 tablet (80 mg total) by mouth 2 (two) times daily., Starting Wed 06/01/2016, Normal    metoprolol tartrate (LOPRESSOR) 50 MG tablet Take 1 tablet (50 mg total) by mouth 2 (two) times daily., Starting Wed 06/01/2016, Normal      CONTINUE these medications which have NOT CHANGED   Details  amLODipine (NORVASC) 10 MG tablet Take 1 tablet (10 mg total) by mouth daily., Starting 06/01/2014, Until Discontinued, Print    atorvastatin (LIPITOR) 80 MG tablet Take 1 tablet (80 mg total) by mouth daily., Starting Wed 01/06/2016, Normal    clopidogrel (PLAVIX) 75 MG tablet Take 75 mg by mouth daily., Historical Med    famotidine (PEPCID) 20 MG tablet Take 20 mg by mouth 2 (two) times daily., Historical Med    gabapentin (NEURONTIN) 300 MG capsule Take 300 mg by mouth See admin instructions. Take one tablet by mouth in the morning and lunch, then take two at dinner and 1 tablet at bedtime per patient, Historical Med    glipiZIDE (GLUCOTROL) 10 MG tablet Take 1 tablet by mouth 2 (two) times daily., Starting Fri 11/13/2015, Historical Med    hydrALAZINE (APRESOLINE) 50 MG tablet Take 1 tablet (50 mg total) by mouth every 8 (eight) hours., Starting 05/29/2014, Until Discontinued, Print    isosorbide dinitrate (ISORDIL) 30 MG tablet Take 30 mg by mouth at bedtime., Historical Med    lisinopril (PRINIVIL,ZESTRIL) 40 MG tablet Take 40 mg by mouth daily., Until Discontinued, Historical Med    metFORMIN (GLUCOPHAGE) 500 MG tablet Take 500 mg by mouth 2 (two) times daily with a meal., Historical Med    Potassium Chloride ER (K-TAB) 20 MEQ TBCR Take 1 tablet by mouth daily. , Starting  Tue 11/10/2015, Historical Med    sertraline (ZOLOFT) 50 MG tablet Take 50 mg by mouth daily., Until Discontinued, Historical Med    sitaGLIPtin (JANUVIA) 100 MG tablet Take 100 mg by mouth daily., Until Discontinued, Historical Med    traZODone (DESYREL) 50 MG tablet Take 50 mg by mouth at bedtime., Historical Med      STOP taking these medications     promethazine (PHENERGAN) 25 MG tablet      oxyCODONE-acetaminophen (PERCOCET/ROXICET) 5-325 MG per tablet        Allergies  Allergen Reactions  . Ibuprofen     Made gastric ulcers worse   Follow-up Information    Almyra Deforest, PA Follow up on 06/13/2016.   Specialties:  Cardiology, Radiology Why:  @ 9:30am ( please show up 15 minutes early) Contact information: [address] [place] [address] [phone]  The results of significant diagnostics from this hospitalization (including imaging, microbiology, ancillary and laboratory) are listed below for reference.    Significant Diagnostic Studies: Dg Chest 2 View  Result Date: 05/30/2016 CLINICAL DATA:  Chest pain, shortness of Breath starting today EXAM: CHEST  2 VIEW COMPARISON:  05/23/2016 FINDINGS: Cardiomediastinal silhouette is stable. No acute infiltrate or pulmonary edema. Degenerative changes thoracic spine. IMPRESSION: No active cardiopulmonary disease. Electronically Signed   By: Lahoma Crocker M.D.   On: 05/30/2016 18:19   Ct Head Wo Contrast  Result Date: 05/30/2016 CLINICAL DATA:  Acute onset of headache and blurred vision. Initial encounter. EXAM: CT HEAD WITHOUT CONTRAST TECHNIQUE: Contiguous axial images were obtained from the base of the skull through the vertex without intravenous contrast. COMPARISON:  CT of the head performed 03/21/2016 FINDINGS: There is no evidence of acute infarction, mass lesion, or intra- or extra-axial hemorrhage on CT. Mild periventricular and subcortical white matter change likely reflects small vessel ischemic  microangiopathy. The posterior fossa, including the cerebellum, brainstem and fourth ventricle, is within normal limits. The third and lateral ventricles, and basal ganglia are unremarkable in appearance. The cerebral hemispheres are symmetric in appearance, with normal gray-white differentiation. No mass effect or midline shift is seen. There is no evidence of fracture; visualized osseous structures are unremarkable in appearance. The orbits are within normal limits. The paranasal sinuses and mastoid air cells are well-aerated. No significant soft tissue abnormalities are seen. IMPRESSION: 1. No acute intracranial pathology seen on CT. 2. Mild small vessel ischemic microangiopathy. Electronically Signed   By: Garald Balding M.D.   On: 05/30/2016 21:29   Ct Angio Chest Pe W And/or Wo Contrast  Result Date: 05/30/2016 CLINICAL DATA:  Left-sided chest pain today. EXAM: CT ANGIOGRAPHY CHEST WITH CONTRAST TECHNIQUE: Multidetector CT imaging of the chest was performed using the standard protocol during bolus administration of intravenous contrast. Multiplanar CT image reconstructions and MIPs were obtained to evaluate the vascular anatomy. CONTRAST:  100 cc Isovue 370 COMPARISON:  Chest CT 04/18/2015 FINDINGS: Chest wall: No chest wall mass, supraclavicular or axillary lymphadenopathy. Mild gynecomastia, stable. Cardiovascular: The heart is mildly enlarged but stable. No pericardial effusion. The aorta is normal in caliber. No dissection. The branch vessels are patent. Stable pulmonary artery enlargement suggesting pulmonary hypertension. No filling defects to suggest pulmonary embolism. Mediastinum/Nodes: No mediastinal or hilar mass or adenopathy. The esophagus is grossly normal. Lungs/Pleura: Dependent subpleural atelectasis but no infiltrates, edema or effusions. Upper Abdomen: Diffuse fatty infiltration of the liver but no hepatic lesions. No upper abdominal mass or adenopathy. Stable small scattered celiac axis  and periportal lymph nodes. Musculoskeletal: No significant bony findings. Stable changes of DISH. Review of the MIP images confirms the above findings. IMPRESSION: 1. No CT findings for pulmonary embolism. 2. Normal thoracic aorta. 3. Stable central pulmonary artery enlargement suggesting pulmonary hypertension. 4. No significant pulmonary findings. 5. Diffuse fatty infiltration of the liver. Electronically Signed   By: Marijo Sanes M.D.   On: 05/30/2016 21:36    Microbiology: Recent Results (from the past 240 hour(s))  MRSA PCR Screening     Status: None   Collection Time: 05/31/16  1:55 AM  Result Value Ref Range Status   MRSA by PCR NEGATIVE NEGATIVE Final    Comment:        The GeneXpert MRSA Assay (FDA approved for NASAL specimens only), is one component of a comprehensive MRSA colonization surveillance program. It is not intended to diagnose MRSA infection nor to  guide or monitor treatment for MRSA infections.      Labs: Basic Metabolic Panel:  Recent Labs Lab 05/30/16 1813 05/31/16 0050 06/01/16 0411  NA 136  --  137  K 3.7  --  3.3*  CL 97*  --  96*  CO2 27  --  30  GLUCOSE 209*  --  248*  BUN 9  --  8  CREATININE 0.76 0.74 0.72  CALCIUM 8.7*  --  8.1*   Liver Function Tests: No results for input(s): AST, ALT, ALKPHOS, BILITOT, PROT, ALBUMIN in the last 168 hours. No results for input(s): LIPASE, AMYLASE in the last 168 hours. No results for input(s): AMMONIA in the last 168 hours. CBC:  Recent Labs Lab 05/30/16 1813 05/31/16 0050 06/01/16 0411  WBC 10.2 8.4 9.1  HGB 12.5* 11.6* 11.2*  HCT 38.7* 35.9* 35.6*  MCV 91.3 91.1 93.2  PLT 268 178 218   Cardiac Enzymes:  Recent Labs Lab 05/31/16 0050 05/31/16 0114  TROPONINI <0.03 <0.03   BNP: BNP (last 3 results)  Recent Labs  05/30/16 1840  BNP 15.8    ProBNP (last 3 results) No results for input(s): PROBNP in the last 8760 hours.  CBG:  Recent Labs Lab 05/31/16 1138 05/31/16 1549  05/31/16 2112 06/01/16 0708 06/01/16 1203  GLUCAP 197* 171* 299* 233* 240*       Signed:  Elwin Mocha MD  FACP  [facility] 06/01/2016, 3:40 PM

## 2016-06-01 NOTE — Care Management Note (Signed)
Case Management Note  Patient Details  Name: [name] MRN: [id number] Date of Birth: 1961-07-14  Subjective/Objective:     Patient is s/p stent intervention, NCM will follow for dc needs.               Action/Plan:   Expected Discharge Date:                  Expected Discharge Plan:  Home/Self Care  In-House Referral:     Discharge planning Services  CM Consult  Post Acute Care Choice:    Choice offered to:     DME Arranged:    DME Agency:     HH Arranged:    HH Agency:     Status of Service:  Completed, signed off  If discussed at [employer] of Stay Meetings, dates discussed:    Additional Comments:  Zenon Mayo, RN 06/01/2016, 9:06 AM

## 2016-06-01 NOTE — Progress Notes (Signed)
CARDIAC REHAB PHASE I   PRE:  Rate/Rhythm: 71 SR  BP:  Sitting: 150/65        SaO2: 93 RA  MODE:  Ambulation: 500 ft   POST:  Rate/Rhythm: 90 SR  BP:  Sitting: 173/79         SaO2: 94 RA  Pt states he felt very short of breath last night when lying in the bed, experienced heaviness in his chest, and had to sleep in the recliner. Pt states he takes a fluid pill, but does not watch his fluid or sodium intake. Pt ambulated 500 ft on RA, handheld assist, steady gait, tolerated fairly well.  Pt c/o of moderate DOE, mild lightheadedness, and L flank pain, seated rest x1. Pt activity appears soemwhat limited at baseline, pt states he gets very short of breath after climbing two flights of stairs and has to sit and rest (pt lives with his sister in a 3rd floor apartment). Completed PCI/stent education.  Reviewed risk factors, anti-platelet therapy, stent card, activity restrictions, ntg, exercise, heart healthy diet, carb counting, portion control, sodium and fluid restrictions, CHF booklet and zone tool, daily weights and phase 2 cardiac rehab. Pt verbalized understanding, fairly receptive to education, however, pt states he is unwilling to make certain diet changes at this time, of note, pt has seen a dietician in past. Pt agrees to phase 2 cardiac rehab referral, will send to [facility] per pt request. Pt to recliner after walk, call bell within reach.   [id number] Lenna Sciara, RN, BSN 06/01/2016 9:10 AM

## 2016-06-01 NOTE — Discharge Instructions (Signed)
Cardiac Rehabilitation °Cardiac rehabilitation is a medically supervised program that helps improve the health and well-being of people with heart problems. Cardiac rehabilitation includes exercise training, education, and counseling to help you get stronger and return to an active lifestyle. People who participate in cardiac rehabilitation programs get better faster and reduce future hospital stays. °Cardiac rehabilitation programs can help when you have had the following conditions: °· Heart attack. °· Heart failure. °· Peripheral artery disease. °· Coronary artery disease. °· Angina. °· Lung or breathing problems. °Cardiac rehabilitation programs are also used when you have the following procedures: °· Coronary artery bypass graft surgery. °· Heart valve replacement. °· Heart stent placement. °· Heart transplant. °· Aneurysm repair. °CARDIAC REHABILITATION MAY HELP YOU: °· Reduce problems like chest pain and trouble breathing. °· Change risk factors that contribute to heart disease, such as: °¨ Smoking. °¨ High blood pressure. °¨ High cholesterol. °¨ Diabetes. °¨ Being out of shape or not active. °¨ Weighing more than 30% over your ideal weight. °¨ Diet. °· Improve your mental outlook so you feel: °¨ Less depressed or "blue." °¨ More hopeful. °¨ Better about yourself. °¨ More confident about taking care of yourself. °· Get support from health experts as well as other people with similar problems. °· Learn how to manage and understand your medicines. °· Teach your family about your condition and how to participate in your recovery. °WHAT HAPPENS IN CARDIAC REHABILITATION? °You will be assessed by a cardiac rehabilitation team. They will check your health history and do a physical exam. You may need blood tests, stress tests, and other evaluations. You may not start a cardiac rehabilitation program if: °· You develop angina with exercise or while at rest. °· You have severe heart failure that limits your  activity. °· You have an abnormal heart rhythm at rest. °· You develop heart rhythm problems during exercise. °· You have high blood pressure that is not controlled. °The cardiac rehabilitation team works with you to make a plan based on your health and goals. Everyone is unique, so each program is customized and your program may change as you progress. Members of a typical cardiac rehabilitation team may include such health professionals as: °· Doctors. °· Nurses. °· Dietitians. °· Psychologists. °· Exercise specialists. °· Physical and occupational therapists. °A typical cardiac rehabilitation program is divided into phases. You advance from one phase to the next. Most cardiac rehabilitation sessions last for 60 minutes, 3 times a week.  °Phase One starts while you are still in the hospital. You may start by walking in your room and then in the hall. You may start some simple exercises with a therapist. Health care team members will give you information and ask you many questions. You may not be able to remember details, so have a family member or an advocate with you to help keep track of information.  °Phase Two begins when you go home or to another facility. This phase may last 8 to 12 weeks. You will travel to a cardiac rehabilitation center or a place where it is offered. Typically, you gradually increase your activity while being closely watched by a nurse or therapist. Exercises may be a combination of strength or resistance training and "cardio" or aerobic movement on a treadmill or other machines. Your condition will determine how often and how long these sessions will last.  °In phase two, you may learn how to cook healthy meals, control your blood sugar, and manage your medicines. You may need   help with scheduling or planning how and when to take your medicines. Use a timer, divided pill box, or follow a form to make taking your medicines easier. Use the method that works best for you. Some medicines  should not be taken with certain foods. If you take more than one blood pressure medicine, you may need to stagger the times you take them. Taking all your blood pressure medicine at the same time may lower your blood pressure too much. If you have questions about your medicines, ask your health care provider questions until you understand.  Phase Three continues for the rest of your life. There will be less supervision. You may still participate in cardiac rehabilitation activities or become part of a group in your community. You may benefit from talking to other people about your experience if they are facing similar challenges. How soon you drive, have sex, or return to work will depend on your condition. These decisions should be made by you and your health care provider. If you need help, ask for it. Find out where you can get the help you need. Ask questions until you get answers and understand. SEEK IMMEDIATE MEDICAL CARE IF:  Get medical help at once if you experience any of the following symptoms:  Severe chest discomfort, especially if the pain is crushing or pressure-like and spreads to the arms, back, neck, or jaw. Do not wait to see if the pain will go away.  Weakness or numbness in your face, arms, or legs, especially on one side of the body; slurred speech; confusion; sudden severe headache or loss of vision (all symptoms of stroke).  You have shortness of breath.  You are sweating and feel sick to your stomach (nausea).  You feel dizzy or faint.  You experience profound tiredness (fatigue). Call your local emergency service (911 in the [place] Do not drive yourself to the hospital.   This information is not intended to replace advice given to you by your health care provider. Make sure you discuss any questions you have with your health care provider.   Document Released: 07/19/2008 Document Revised: 10/31/2014 Document Reviewed: 01/14/2011 Elsevier Interactive Patient Education  2016 [place].  Chest Pain Observation It is often hard to give a specific diagnosis for the cause of chest pain. Among other possibilities your symptoms might be caused by inadequate oxygen delivery to your heart (angina). Angina that is not treated or evaluated can lead to a heart attack (myocardial infarction) or death. Blood tests, electrocardiograms, and X-rays may have been done to help determine a possible cause of your chest pain. After evaluation and observation, your health care provider has determined that it is unlikely your pain was caused by an unstable condition that requires hospitalization. However, a full evaluation of your pain may need to be completed, with additional diagnostic testing as directed. It is very important to keep your follow-up appointments. Not keeping your follow-up appointments could result in permanent heart damage, disability, or death. If there is any problem keeping your follow-up appointments, you must call your health care provider. HOME CARE INSTRUCTIONS  Due to the slight chance that your pain could be angina, it is important to follow your health care provider's treatment plan and also maintain a healthy lifestyle:  Maintain or work toward achieving a healthy weight.  Stay physically active and exercise regularly.  Decrease your salt intake.  Eat a balanced, healthy diet. Talk to a dietitian to learn about heart-healthy foods.  Increase your  fiber intake by including whole grains, vegetables, fruits, and nuts in your diet.  Avoid situations that cause stress, anger, or depression.  Take medicines as advised by your health care provider. Report any side effects to your health care provider. Do not stop medicines or adjust the dosages on your own.  Quit smoking. Do not use nicotine patches or gum until you check with your health care provider.  Keep your blood pressure, blood sugar, and cholesterol levels within normal limits.  Limit alcohol  intake to no more than 1 drink per day for women who are not pregnant and 2 drinks per day for men.  Do not abuse drugs. SEEK IMMEDIATE MEDICAL CARE IF: You have severe chest pain or pressure which may include symptoms such as:  You feel pain or pressure in your arms, neck, jaw, or back.  You have severe back or abdominal pain, feel sick to your stomach (nauseous), or throw up (vomit).  You are sweating profusely.  You are having a fast or irregular heartbeat.  You feel short of breath while at rest.  You notice increasing shortness of breath during rest, sleep, or with activity.  You have chest pain that does not get better after rest or after taking your usual medicine.  You wake from sleep with chest pain.  You are unable to sleep because you cannot breathe.  You develop a frequent cough or you are coughing up blood.  You feel dizzy, faint, or experience extreme fatigue.  You develop severe weakness, dizziness, fainting, or chills. Any of these symptoms may represent a serious problem that is an emergency. Do not wait to see if the symptoms will go away. Call your local emergency services (911 in the [place] Do not drive yourself to the hospital. MAKE SURE YOU:  Understand these instructions.  Will watch your condition.  Will get help right away if you are not doing well or get worse.   This information is not intended to replace advice given to you by your health care provider. Make sure you discuss any questions you have with your health care provider.   Document Released: 11/12/2010 Document Revised: 10/15/2013 Document Reviewed: 04/11/2013 Elsevier Interactive Patient Education 2016 [employer]   Heart-Healthy Eating Plan Heart-healthy meal planning includes:  Limiting unhealthy fats.  Increasing healthy fats.  Making other small dietary changes. You may need to talk with your doctor or a diet specialist (dietitian) to create an eating plan that is right  for you. WHAT TYPES OF FAT SHOULD I CHOOSE?  Choose healthy fats. These include olive oil and canola oil, flaxseeds, walnuts, almonds, and seeds.  Eat more omega-3 fats. These include salmon, mackerel, sardines, tuna, flaxseed oil, and ground flaxseeds. Try to eat fish at least twice each week.  Limit saturated fats.  Saturated fats are often found in animal products, such as meats, butter, and cream.  Plant sources of saturated fats include palm oil, palm kernel oil, and coconut oil.  Avoid foods with partially hydrogenated oils in them. These include stick margarine, some tub margarines, cookies, crackers, and other baked goods. These contain trans fats. WHAT GENERAL GUIDELINES DO I NEED TO FOLLOW?  Check food labels carefully. Identify foods with trans fats or high amounts of saturated fat.  Fill one half of your plate with vegetables and green salads. Eat 4-5 servings of vegetables per day. A serving of vegetables is:  1 cup of raw leafy vegetables.   cup of raw or cooked cut-up vegetables.  cup of vegetable juice.  Fill one fourth of your plate with whole grains. Look for the word "whole" as the first word in the ingredient list.  Fill one fourth of your plate with lean protein foods.  Eat 4-5 servings of fruit per day. A serving of fruit is:  One medium whole fruit.   cup of dried fruit.   cup of fresh, frozen, or canned fruit.   cup of 100% fruit juice.  Eat more foods that contain soluble fiber. These include apples, broccoli, carrots, beans, peas, and barley. Try to get 20-30 g of fiber per day.  Eat more home-cooked food. Eat less restaurant, buffet, and fast food.  Limit or avoid alcohol.  Limit foods high in starch and sugar.  Avoid fried foods.  Avoid frying your food. Try baking, boiling, grilling, or broiling it instead. You can also reduce fat by:  Removing the skin from poultry.  Removing all visible fats from meats.  Skimming the fat off  of stews, soups, and gravies before serving them.  Steaming vegetables in water or broth.  Lose weight if you are overweight.  Eat 4-5 servings of nuts, legumes, and seeds per week:  One serving of dried beans or legumes equals  cup after being cooked.  One serving of nuts equals 1 ounces.  One serving of seeds equals  ounce or one tablespoon.  You may need to keep track of how much salt or sodium you eat. This is especially true if you have high blood pressure. Talk with your doctor or dietitian to get more information. WHAT FOODS CAN I EAT? Grains Breads, including [place], white, pita, wheat, raisin, rye, oatmeal, and [place]. Tortillas that are neither fried nor made with lard or trans fat. Low-fat rolls, including hotdog and hamburger buns and English muffins. Biscuits. Muffins. Waffles. Pancakes. Light popcorn. Whole-grain cereals. Flatbread. Melba toast. Pretzels. Breadsticks. Rusks. Low-fat snacks. Low-fat crackers, including oyster, saltine, matzo, graham, animal, and rye. Rice and pasta, including brown rice and pastas that are made with whole wheat.  Vegetables All vegetables.  Fruits All fruits, but limit coconut. Meats and Other Protein Sources Lean, well-trimmed beef, veal, pork, and lamb. Chicken and [place] without skin. All fish and shellfish. Wild duck, rabbit, pheasant, and venison. Egg whites or low-cholesterol egg substitutes. Dried beans, peas, lentils, and tofu. Seeds and most nuts. Dairy Low-fat or nonfat cheeses, including ricotta, string, and mozzarella. Skim or 1% milk that is liquid, powdered, or evaporated. Buttermilk that is made with low-fat milk. Nonfat or low-fat yogurt. Beverages Mineral water. Diet carbonated beverages. Sweets and Desserts Sherbets and fruit ices. Honey, jam, marmalade, jelly, and syrups. Meringues and gelatins. Pure sugar candy, such as hard candy, jelly beans, gumdrops, mints, marshmallows, and small amounts of dark chocolate. [employer]. Eat all sweets and desserts in moderation. Fats and Oils Nonhydrogenated (trans-free) margarines. Vegetable oils, including soybean, sesame, sunflower, olive, peanut, safflower, corn, canola, and cottonseed. Salad dressings or mayonnaise made with a vegetable oil. Limit added fats and oils that you use for cooking, baking, salads, and as spreads. Other Cocoa powder. Coffee and tea. All seasonings and condiments. The items listed above may not be a complete list of recommended foods or beverages. Contact your dietitian for more options. WHAT FOODS ARE NOT RECOMMENDED? Grains Breads that are made with saturated or trans fats, oils, or whole milk. Croissants. Butter rolls. Cheese breads. Sweet rolls. Donuts. Buttered popcorn. Chow mein noodles. High-fat crackers, such as cheese or butter  crackers. Meats and Other Protein Sources Fatty meats, such as hotdogs, short ribs, sausage, spareribs, bacon, rib eye roast or steak, and mutton. High-fat deli meats, such as salami and bologna. Caviar. Domestic duck and goose. Organ meats, such as kidney, liver, sweetbreads, and heart. Dairy Cream, sour cream, cream cheese, and creamed cottage cheese. Whole-milk cheeses, including blue (bleu), Monte[place] Jack, [place], [place], American, [place], Swiss, cheddar, [place], and [place]. Whole or 2% milk that is liquid, evaporated, or condensed. Whole buttermilk. Cream sauce or high-fat cheese sauce. Yogurt that is made from whole milk. Beverages Regular sodas and juice drinks with added sugar. Sweets and Desserts Frosting. Pudding. Cookies. Cakes other than angel food cake. Candy that has milk chocolate or white chocolate, hydrogenated fat, butter, coconut, or unknown ingredients. Buttered syrups. Full-fat ice cream or ice cream drinks. Fats and Oils Gravy that has suet, meat fat, or shortening. Cocoa butter, hydrogenated oils, palm oil, coconut oil, palm kernel oil. These can often be found in baked products,  candy, fried foods, nondairy creamers, and whipped toppings. Solid fats and shortenings, including bacon fat, salt pork, lard, and butter. Nondairy cream substitutes, such as coffee creamers and sour cream substitutes. Salad dressings that are made of unknown oils, cheese, or sour cream. The items listed above may not be a complete list of foods and beverages to avoid. Contact your dietitian for more information.   This information is not intended to replace advice given to you by your health care provider. Make sure you discuss any questions you have with your health care provider.   Document Released: 04/10/2012 Document Revised: 10/31/2014 Document Reviewed: 04/03/2014 Elsevier Interactive Patient Education [employer].

## 2016-06-01 NOTE — Progress Notes (Signed)
  Echocardiogram 2D Echocardiogram has been performed.  [name] 06/01/2016, 10:16 AM

## 2016-06-01 NOTE — Progress Notes (Signed)
Subjective:  No CP/SOB s/p LAD PCI/DES  Objective:  Temp:  [97 F (36.1 C)-98.1 F (36.7 C)] 98 F (36.7 C) (08/09 0805) Pulse Rate:  [0-70] 70 (08/09 0805) Resp:  [0-64] 16 (08/09 0805) BP: (109-150)/(60-88) 150/65 (08/09 0805) SpO2:  [0 %-100 %] 95 % (08/09 0805) Weight:  [321 lb 14 oz (146 kg)] 321 lb 14 oz (146 kg) (08/09 0400) Weight change: -4 lb 0.5 oz (-1.827 kg)  Intake/Output from previous day: 08/08 0701 - 08/09 0700 In: 847.3 [P.O.:480; I.V.:367.3] Out: 1000 [Urine:1000]  Intake/Output from this shift: No intake/output data recorded.  Physical Exam: General appearance: alert and no distress Neck: no adenopathy, no carotid bruit, no JVD, supple, symmetrical, trachea midline and thyroid not enlarged, symmetric, no tenderness/mass/nodules Lungs: clear to auscultation bilaterally Heart: regular rate and rhythm, S1, S2 normal, no murmur, click, rub or gallop Extremities: tense edema BLE  Lab Results: Results for orders placed or performed during the hospital encounter of 05/30/16 (from the past 48 hour(s))  Basic metabolic panel     Status: Abnormal   Collection Time: 05/30/16  6:13 PM  Result Value Ref Range   Sodium 136 135 - 145 mmol/L   Potassium 3.7 3.5 - 5.1 mmol/L   Chloride 97 (L) 101 - 111 mmol/L   CO2 27 22 - 32 mmol/L   Glucose, Bld 209 (H) 65 - 99 mg/dL   BUN 9 6 - 20 mg/dL   Creatinine, Ser 0.76 0.61 - 1.24 mg/dL   Calcium 8.7 (L) 8.9 - 10.3 mg/dL   GFR calc non Af Amer >60 >60 mL/min   GFR calc Af Amer >60 >60 mL/min    Comment: (NOTE) The eGFR has been calculated using the CKD EPI equation. This calculation has not been validated in all clinical situations. eGFR's persistently <60 mL/min signify possible Chronic Kidney Disease.    Anion gap 12 5 - 15  CBC     Status: Abnormal   Collection Time: 05/30/16  6:13 PM  Result Value Ref Range   WBC 10.2 4.0 - 10.5 K/uL   RBC 4.24 4.22 - 5.81 MIL/uL   Hemoglobin 12.5 (L) 13.0 - 17.0 g/dL    HCT 38.7 (L) 39.0 - 52.0 %   MCV 91.3 78.0 - 100.0 fL   MCH 29.5 26.0 - 34.0 pg   MCHC 32.3 30.0 - 36.0 g/dL   RDW 14.4 11.5 - 15.5 %   Platelets 268 150 - 400 K/uL  I-stat troponin, ED     Status: None   Collection Time: 05/30/16  6:15 PM  Result Value Ref Range   Troponin i, poc 0.00 0.00 - 0.08 ng/mL   Comment 3            Comment: Due to the release kinetics of cTnI, a negative result within the first hours of the onset of symptoms does not rule out myocardial infarction with certainty. If myocardial infarction is still suspected, repeat the test at appropriate intervals.   Brain natriuretic peptide     Status: None   Collection Time: 05/30/16  6:40 PM  Result Value Ref Range   B Natriuretic Peptide 15.8 0.0 - 100.0 pg/mL  Troponin I (q 6hr x 3)     Status: None   Collection Time: 05/31/16 12:50 AM  Result Value Ref Range   Troponin I <0.03 <0.03 ng/mL  CBC     Status: Abnormal   Collection Time: 05/31/16 12:50 AM  Result Value Ref Range  WBC 8.4 4.0 - 10.5 K/uL   RBC 3.94 (L) 4.22 - 5.81 MIL/uL   Hemoglobin 11.6 (L) 13.0 - 17.0 g/dL   HCT 35.9 (L) 39.0 - 52.0 %   MCV 91.1 78.0 - 100.0 fL   MCH 29.4 26.0 - 34.0 pg   MCHC 32.3 30.0 - 36.0 g/dL   RDW 14.4 11.5 - 15.5 %   Platelets 178 150 - 400 K/uL  Creatinine, serum     Status: None   Collection Time: 05/31/16 12:50 AM  Result Value Ref Range   Creatinine, Ser 0.74 0.61 - 1.24 mg/dL   GFR calc non Af Amer >60 >60 mL/min   GFR calc Af Amer >60 >60 mL/min    Comment: (NOTE) The eGFR has been calculated using the CKD EPI equation. This calculation has not been validated in all clinical situations. eGFR's persistently <60 mL/min signify possible Chronic Kidney Disease.   Glucose, capillary     Status: Abnormal   Collection Time: 05/31/16  1:13 AM  Result Value Ref Range   Glucose-Capillary 251 (H) 65 - 99 mg/dL  Troponin I (q 6hr x 3)     Status: None   Collection Time: 05/31/16  1:14 AM  Result Value Ref  Range   Troponin I <0.03 <0.03 ng/mL  Lipid panel     Status: Abnormal   Collection Time: 05/31/16  1:14 AM  Result Value Ref Range   Cholesterol 82 0 - 200 mg/dL   Triglycerides 244 (H) <150 mg/dL   HDL 26 (L) >40 mg/dL   Total CHOL/HDL Ratio 3.2 RATIO   VLDL 49 (H) 0 - 40 mg/dL   LDL Cholesterol 7 0 - 99 mg/dL    Comment:        Total Cholesterol/HDL:CHD Risk Coronary Heart Disease Risk Table                     Men   Women  1/2 Average Risk   3.4   3.3  Average Risk       5.0   4.4  2 X Average Risk   9.6   7.1  3 X Average Risk  23.4   11.0        Use the calculated Patient Ratio above and the CHD Risk Table to determine the patient's CHD Risk.        ATP III CLASSIFICATION (LDL):  <100     mg/dL   Optimal  100-129  mg/dL   Near or Above                    Optimal  130-159  mg/dL   Borderline  160-189  mg/dL   High  >190     mg/dL   Very High   MRSA PCR Screening     Status: None   Collection Time: 05/31/16  1:55 AM  Result Value Ref Range   MRSA by PCR NEGATIVE NEGATIVE    Comment:        The GeneXpert MRSA Assay (FDA approved for NASAL specimens only), is one component of a comprehensive MRSA colonization surveillance program. It is not intended to diagnose MRSA infection nor to guide or monitor treatment for MRSA infections.   Glucose, capillary     Status: Abnormal   Collection Time: 05/31/16  7:33 AM  Result Value Ref Range   Glucose-Capillary 235 (H) 65 - 99 mg/dL  Glucose, capillary     Status: Abnormal   Collection  Time: 05/31/16 11:38 AM  Result Value Ref Range   Glucose-Capillary 197 (H) 65 - 99 mg/dL   Comment 1 Notify RN    Comment 2 Document in Chart   POCT Activated clotting time     Status: None   Collection Time: 05/31/16  2:48 PM  Result Value Ref Range   Activated Clotting Time 456 seconds  Glucose, capillary     Status: Abnormal   Collection Time: 05/31/16  3:49 PM  Result Value Ref Range   Glucose-Capillary 171 (H) 65 - 99 mg/dL    Glucose, capillary     Status: Abnormal   Collection Time: 05/31/16  9:12 PM  Result Value Ref Range   Glucose-Capillary 299 (H) 65 - 99 mg/dL   Comment 1 Notify RN    Comment 2 Document in Chart   Basic metabolic panel     Status: Abnormal   Collection Time: 06/01/16  4:11 AM  Result Value Ref Range   Sodium 137 135 - 145 mmol/L   Potassium 3.3 (L) 3.5 - 5.1 mmol/L   Chloride 96 (L) 101 - 111 mmol/L   CO2 30 22 - 32 mmol/L   Glucose, Bld 248 (H) 65 - 99 mg/dL   BUN 8 6 - 20 mg/dL   Creatinine, Ser 0.72 0.61 - 1.24 mg/dL   Calcium 8.1 (L) 8.9 - 10.3 mg/dL   GFR calc non Af Amer >60 >60 mL/min   GFR calc Af Amer >60 >60 mL/min    Comment: (NOTE) The eGFR has been calculated using the CKD EPI equation. This calculation has not been validated in all clinical situations. eGFR's persistently <60 mL/min signify possible Chronic Kidney Disease.    Anion gap 11 5 - 15  CBC     Status: Abnormal   Collection Time: 06/01/16  4:11 AM  Result Value Ref Range   WBC 9.1 4.0 - 10.5 K/uL   RBC 3.82 (L) 4.22 - 5.81 MIL/uL   Hemoglobin 11.2 (L) 13.0 - 17.0 g/dL   HCT 35.6 (L) 39.0 - 52.0 %   MCV 93.2 78.0 - 100.0 fL   MCH 29.3 26.0 - 34.0 pg   MCHC 31.5 30.0 - 36.0 g/dL   RDW 14.6 11.5 - 15.5 %   Platelets 218 150 - 400 K/uL  Glucose, capillary     Status: Abnormal   Collection Time: 06/01/16  7:08 AM  Result Value Ref Range   Glucose-Capillary 233 (H) 65 - 99 mg/dL   Comment 1 Notify RN    Comment 2 Document in Chart     Imaging: Imaging results have been reviewed  Tele- NSR 72  Assessment/Plan:   1. Principal Problem: 2.   Nonspecific chest pain 3. Active Problems: 4.   Dyslipidemia 5.   HTN (hypertension) 6.   Type 2 diabetes mellitus with vascular disease ([place]) 7.   Normocytic normochromic anemia 8.   Chest pain 9.   Time Spent Directly with Patient:  20 minutes  Length of Stay:  LOS: 1 day   S/P FFR guided (.67) prox LAD PCI DES with Synergy DES. No other  signig CAD. On DAPT. BLE edema on Lasix 40 mg PO BID. I/O neg 1 Liter. Will increase to 80 mg PO BID. Renal fxn nl. Suspect dietary indiscretion. Will get 2D for LV fxn. Venous dopplers pending. If no DVT can prob be D/Cd home with close OP F/U with Dr Stanford Breed.   [name] 06/01/2016, 9:18 AM

## 2016-06-01 NOTE — Progress Notes (Signed)
**  Preliminary report by tech**   Bilateral lower extremity venous duplex completed. There is no evidence of deep or superficial vein thrombosis involving the right or left lower extremities. All visualized vessels appear patent and compressible. There is no evidence of a Baker's cyst bilaterally.   06/01/16 11:24 AM [name] RVT

## 2016-06-02 NOTE — Telephone Encounter (Signed)
Patient contacted regarding discharge from [facility] on 06/01/16.  Patient understands to follow up with provider Almyra Deforest on 8/21 at 9:30a at [facility]. Patient understands discharge instructions? Yes Patient understands medications and regimen? Yes Patient understands to bring all medications to this visit? Yes

## 2016-06-03 ENCOUNTER — Telehealth: Payer: Self-pay | Admitting: Cardiology

## 2016-06-03 NOTE — Telephone Encounter (Signed)
[name] is calling because he has a question about one of his medications .Marland KitchenPlease call  Thanks

## 2016-06-03 NOTE — Telephone Encounter (Signed)
Question regarding cardiac meds addressed to pt's satisfaction. He voiced understanding and thanks.

## 2016-06-12 NOTE — Progress Notes (Addendum)
Cardiology Office Note    Date:  06/13/2016   ID:  [name], DOB 1960-12-18, MRN [id number]  PCP:  Geannie Risen, MD  Cardiologist:  Dr. Stanford Breed   Chief Complaint  Patient presents with  . Follow-up    has chest pain, has some shortness of breath with walking, has some edema in legs, has pain in legs, has lightheaded and dizziness daily    History of Present Illness:  [name] is a 55 y.o. male with PMH of CAD, HTN, HLD, peptic ulcer disease, DM, h/o DVT and OSA on CPAP. He recently presented to [facility] on 05/30/2016 for evaluation chest pain. He previously had a Myoview in February 2015 at [facility] showed normal perfusion EF of 47%. Cardiac catheterization in April 2015 showed 50% stenosis in LAD and first diagonal bifurcation but no other significant disease noted. He was last seen by Dr. Stanford Breed in March 2017 for atypical chest pain, beta blocker was increased due to elevated blood pressure. Follow-up stress test obtained on 02/02/2016 was low risk study with EF 55-65%. He did have a syncopal episode on 03/21/2016 which was felt to be vasovagal in nature. Echocardiogram obtained in May 2017 showed EF 60-65%. Carotid Doppler obtained on 03/21/2016 showed bilateral 40-59% stenosis. He was recently seen by GI for dysplasia on 05/09/2016, he is pending EGD for further evaluation. He also has upcoming referral to vascular surgery for symptoms concerning for claudication.   On arrival to [facility] on 05/30/2016, he was complaining of sudden onset of left-sided sharp stabbing pain at rest. The symptom was accompanied by episodic shortness of breath, lower extremity edema, severe dizziness and headache while in the ED. His Lasix was recently increased. CT of the head in the ED showed no acute abnormality. CTA of the chest showed no evidence of PE however suggest pulmonary hypertension. Given his morbid obesity and the recent negative Myoview, the decision was made  to proceed with coronary angiography to definitively assess coronary vessels. Cardiac catheterization performed on 05/31/2016 showed 25% mid RCA lesion, 70% mid LAD lesion with abnormal FFR of 0.67, this was treated with Synergy 4 x 16 mm DES, LVEDP of 29 mmHg. Cardiac catheterization performed on 06/01/2016 showed EF 55-60%, no regional wall motion abnormality, no significant valvular issues. Post procedure, he was continued on aspirin and Plavix. His Lasix was increased to 80 mg twice a day. Venous doppler obtained on 06/01/2016 was negative for DVT.  According to the patient, the day after his discharge from the [facility], he was readmitted to [facility] for recurrent chest pain. Serial troponin was negative x 3. He also complained of headache and dizziness. CT of the head was negative for acute process, there are mention of parenchymal masses or mass effect. CT of cervical spine showed mild narrowing in the C5-C7 area with mild loss of disc height around the same area. A limited echocardiogram was obtained on 06/04/2016 which showed EF 65-70%, mild concentric LVH. Patient is not sure what is causing the episode of chest discomfort, he says is similar to what he experienced prior to recent cath, however chest discomfort worse with cough and better with body rotation. Serial enzyme was negative 3 despite more than 6 hours of chest pain. He says he also experienced shortness breath with walking which has been ongoing for awhile and it did not seems to improve after cath, along with shortness breath, he also has chest discomfort. He think  his shortness breath with walking likely agitate him enough to bring on the chest discomfort. His shortness of breath only occurs with exertion, does not occur at rest. He has history of DVT and finished a course of Xarelto. I think his shortness of breath this likely related to body size and obesity instead of PE, I have encouraged him to continue to walk.  Note, recent CTA of chest negative on 8/7.  His symptom is somewhat atypical. I will repeat a BMET given his recent hypokalemia and increased dose of Lasix. On physical exam he appears to be largely euvolemic. His lower extremity appears to be woody with hard scale. I do not think he is fluid overloaded based on physical exam. His EKG is benign without significant ST-T wave changes. Given further workup done at [facility], I am more inclined to continue to observe his symptom. I will refer him to cardiac rehabilitation. He is also planning to see a Dr. Gean Quint with [facility] to further discuss vascular issue and have LE doppler. He says he can walk roughly 30 yards before having pain in the neck. He is compliant with his medication. His GI workup is being delayed given the need for DAPT.     Past Medical History:  Diagnosis Date  . Back pain   . CAD (coronary artery disease)    Nonobstructive  . Hyperlipidemia   . Hypertension   . IBS (irritable bowel syndrome)   . OSA (obstructive sleep apnea)   . Pancreatitis   . Peptic ulcer   . PUD (peptic ulcer disease)   . Type 2 diabetes mellitus ([place])     Past Surgical History:  Procedure Laterality Date  . CARDIAC CATHETERIZATION N/A 05/31/2016   Procedure: Left Heart Cath and Coronary Angiography;  Surgeon: Peter M Martinique, MD;  Location: [place] CV LAB;  Service: Cardiovascular;  Laterality: N/A;  . CARDIAC CATHETERIZATION N/A 05/31/2016   Procedure: Intravascular Pressure Wire/FFR Study;  Surgeon: Peter M Martinique, MD;  Location: [place] CV LAB;  Service: Cardiovascular;  Laterality: N/A;  . CARDIAC CATHETERIZATION N/A 05/31/2016   Procedure: Coronary Stent Intervention;  Surgeon: Peter M Martinique, MD;  Location: [place] CV LAB;  Service: Cardiovascular;  Laterality: N/A;  . LEFT HEART CATHETERIZATION WITH CORONARY ANGIOGRAM N/A 02/03/2014   Procedure: LEFT HEART CATHETERIZATION WITH CORONARY ANGIOGRAM;  Surgeon: Pixie Casino, MD;  Location: [facility] CATH LAB;  Service: Cardiovascular;  Laterality: N/A;    Current Medications: Outpatient Medications Prior to Visit  Medication Sig Dispense Refill  . amLODipine (NORVASC) 10 MG tablet Take 1 tablet (10 mg total) by mouth daily. 30 tablet 1  . aspirin 81 MG chewable tablet Chew 1 tablet (81 mg total) by mouth daily. 30 tablet 0  . atorvastatin (LIPITOR) 80 MG tablet Take 1 tablet (80 mg total) by mouth daily. 90 tablet 3  . clopidogrel (PLAVIX) 75 MG tablet Take 75 mg by mouth daily.    . famotidine (PEPCID) 20 MG tablet Take 20 mg by mouth 2 (two) times daily.    . furosemide (LASIX) 80 MG tablet Take 1 tablet (80 mg total) by mouth 2 (two) times daily. 60 tablet 0  . gabapentin (NEURONTIN) 300 MG capsule Take 300 mg by mouth See admin instructions. Take one tablet by mouth in the morning and lunch, then take two at dinner and 1 tablet at bedtime per patient    . glipiZIDE (GLUCOTROL) 10 MG tablet Take 1 tablet by mouth  2 (two) times daily.  5  . hydrALAZINE (APRESOLINE) 50 MG tablet Take 1 tablet (50 mg total) by mouth every 8 (eight) hours. (Patient taking differently: Take 100 mg by mouth 2 (two) times daily. ) 90 tablet 0  . isosorbide dinitrate (ISORDIL) 30 MG tablet Take 30 mg by mouth at bedtime.    Marland Kitchen lisinopril (PRINIVIL,ZESTRIL) 40 MG tablet Take 40 mg by mouth daily.    . metFORMIN (GLUCOPHAGE) 500 MG tablet Take 500 mg by mouth 2 (two) times daily with a meal.    . metoprolol tartrate (LOPRESSOR) 50 MG tablet Take 1 tablet (50 mg total) by mouth 2 (two) times daily. 60 tablet 0  . Potassium Chloride ER (K-TAB) 20 MEQ TBCR Take 1 tablet by mouth daily.     . sertraline (ZOLOFT) 50 MG tablet Take 50 mg by mouth daily.    . sitaGLIPtin (JANUVIA) 100 MG tablet Take 100 mg by mouth daily.    . traZODone (DESYREL) 50 MG tablet Take 50 mg by mouth at bedtime.     No facility-administered medications prior to visit.      Allergies:   Coconut oil; Aspirin;  Glucosamine; and Ibuprofen   Social History   Social History  . Marital status: Single    Spouse name: N/A  . Number of children: 3  . Years of education: 12   Occupational History  . Scientist, product/process development   Social History Main Topics  . Smoking status: Never Smoker  . Smokeless tobacco: Never Used  . Alcohol use No  . Drug use: No  . Sexual activity: Not Asked   Other Topics Concern  . None   Social History Narrative  . None     Family History:  The patient's family history includes Cancer in his father; Diabetes in his mother; Hypertension in his brother, mother, and sister.   ROS:   Please see the history of present illness.    ROS All other systems reviewed and are negative.   PHYSICAL EXAM:   VS:  BP 110/66 (BP Location: Right Arm, Patient Position: Sitting, Cuff Size: Large)   Pulse 80   Ht 5\' 10"  (1.778 m)   Wt (!) 321 lb 8 oz (145.8 kg)   SpO2 97%   BMI 46.13 kg/m    GEN: Well nourished, well developed, in no acute distress  HEENT: normal  Neck: no JVD, carotid bruits, or masses Cardiac: RRR; no murmurs, rubs, or gallops,no edema  Respiratory:  clear to auscultation bilaterally, normal work of breathing GI: soft, nontender, nondistended, + BS MS: no deformity or atrophy  Skin: warm and dry, no rash. LE woody harding scale Neuro:  Alert and Oriented x 3, Strength and sensation are intact Psych: euthymic mood, full affect  Wt Readings from Last 3 Encounters:  06/13/16 (!) 321 lb 8 oz (145.8 kg)  06/01/16 (!) 321 lb 14 oz (146 kg)  02/02/16 (!) 320 lb (145.2 kg)      Studies/Labs Reviewed:   EKG:  EKG is ordered today.  The ekg ordered today demonstrates NSR with nonspecific T wave changes, no ST changes  Recent Labs: 02/01/2016: ALT 22 05/30/2016: B Natriuretic Peptide 15.8 06/01/2016: BUN 8; Creatinine, Ser 0.72; Hemoglobin 11.2; Platelets 218; Potassium 3.3; Sodium 137   Lipid Panel    Component Value Date/Time   CHOL 82  05/31/2016 0114   TRIG 244 (H) 05/31/2016 0114   HDL 26 (L) 05/31/2016 0114   CHOLHDL 3.2 05/31/2016 0114  VLDL 49 (H) 05/31/2016 0114   LDLCALC 7 05/31/2016 0114    Additional studies/ records that were reviewed today include:   CTA of chest 05/30/2016 IMPRESSION: 1. No CT findings for pulmonary embolism. 2. Normal thoracic aorta. 3. Stable central pulmonary artery enlargement suggesting pulmonary hypertension. 4. No significant pulmonary findings. 5. Diffuse fatty infiltration of the liver.   CT head wo contrast 05/30/2016 IMPRESSION: 1. No acute intracranial pathology seen on CT. 2. Mild small vessel ischemic microangiopathy.   Cath 05/31/2016 Conclusion     Mid RCA lesion, 25 %stenosed.  A STENT SYNERGY DES 4X16 drug eluting stent was successfully placed.  Mid LAD lesion, 70 %stenosed.  Post intervention, there is a 0% residual stenosis.   1. Single vessel obstructive CAD. 70% mid LAD with abnormal FFR of 0.67.  2. Successful stenting of the mid LAD with DES 3. Elevated LVEDP  Plan: DAPT for one year. Anticipate DC in am.       Echo 06/01/2016 LV EF: 55% -   60%  ------------------------------------------------------------------- Indications:      Chest pain 786.51.  ------------------------------------------------------------------- History:   PMH:   Coronary artery disease.  Risk factors: Hypertension. Diabetes mellitus. Morbidly obese. Dyslipidemia.  ------------------------------------------------------------------- Study Conclusions  - Left ventricle: The cavity size was normal. There was mild   concentric hypertrophy. Systolic function was normal. The   estimated ejection fraction was in the range of 55% to 60%. Wall   motion was normal; there were no regional wall motion   abnormalities. Left ventricular diastolic function parameters   were normal. - Aortic valve: Transvalvular velocity was within the normal range.   There was no  stenosis. There was no regurgitation. Valve area   (VTI): 2.67 cm^2. Valve area (Vmax): 2.23 cm^2. Valve area   (Vmean): 2.09 cm^2. - Mitral valve: Transvalvular velocity was within the normal range.   There was no evidence for stenosis. There was trivial   regurgitation. - Right ventricle: The cavity size was normal. Wall thickness was   normal. Systolic function was normal. - Atrial septum: No defect or patent foramen ovale was identified   by color flow Doppler. - Tricuspid valve: There was no regurgitation.   Venous doppler 06/01/2016 - No evidence of deep vein or superficial thrombosis involving the   right lower extremity and left lower extremity. - No evidence of Baker&'s cyst on the right or left.   Limited TTE obtained at [facility] 06/04/2016 Findings Left Ventricle Ejection fraction is visually estimated at 65-70% Mild concentric left ventricular hypertrophy   Head and Neck CT obtained at [facility] on 06/04/2016  CT HEAD FINDINGS  The ventricles are normal in size and configuration.  There are parenchymal masses or mass effect. There is no evidence of an infarct. Subtle hypoattenuation is noted in the subcortical white matter consistent with chronic microvascular ischemic change.  There are no extra-axial masses or abnormal fluid collections.  There is no intracranial hemorrhage.  The visualized sinuses and mastoid air cells are clear.  CT CERVICAL SPINE FINDINGS  No fracture.No spondylolisthesis.  Mild loss of disc height at C5-C6 and C6-C7. There is endplate spurring from C4 through the upper thoracic spine. Mild neural foraminal narrowing from uncovertebral spurring noted on the right at C5-C6. Central spinal canal is mildly narrowed at at C5-C6 and C6-C7.  Soft tissues are unremarkable.Lung apices are clear.    ASSESSMENT:    1. Coronary artery disease involving native coronary artery of native heart with other  form of angina pectoris  ([place])   2. Essential hypertension   3. Hyperlipidemia   4. Diabetes mellitus type 2 in obese ([place])   5. OSA on CPAP   6. Morbid obesity due to excess calories ([place])      PLAN:  In order of problems listed above:  1. CAD s/p DES to mid LAD  - continue ASA and plavix. Check BMET given recent hypokalemia and increased lasix, if K is low, may need to increase KCl. He continued to have intermittent chest discomfort despite cath. He was readmitted at [facility] today after he was released from [facility]. His serial troponin was negative despite longer than 6 hours onset of chest pain and left arm numbness. Repeat echocardiogram obtained at [facility] showed EF 65-70%. Given reassuring workup, I think it is reasonable to monitor his symptom for now.  2. HTN: Very well controlled on current medication, he is on Imdur, hydralazine, metoprolol, lisinopril, and amlodipine. Systolic blood pressure in the 110s range. If he has more dizziness, we may begin to cut back on lisinopril.  3. HLD: lipid panel obtain on 05/31/2016 chol 82, trig 244, HDL 26, LDL 7. Consider add fenofibrate on next visit  4. DM: Poorly controlled, with average blood glucose 2-300, currently being managed by PCP, he says he is going to see an Endocrinologist.  5. h/o DVT: recently finished course of systemic anticoagulation. CTA of chest obtained on 05/30/2016 negative for PE  6. OSA on CPAP: continue on CPAP  7. Morbid obesity: ultimately need to lose weight in order to control his symptom  8. Dizziness: recent CT obtained at [facility] 06/04/2016 showed:" There are parenchymal masses or mass effect". Unclear significance, will defer to PCP.    Medication Adjustments/Labs and Tests Ordered: Current medicines are reviewed at length with the patient today.  Concerns regarding medicines are outlined above.  Medication changes, Labs and Tests ordered today are listed in the Patient Instructions  below. Patient Instructions  Medication Instructions:  Your physician recommends that you continue on your current medications as directed. Please refer to the Current Medication list given to you today.   Labwork: Bmet today  Testing/Procedures: None ordered  Follow-Up: Your physician recommends that you schedule a follow-up appointment in: 2 months with Dr.Crenshaw  You have been referred to [facility] Cardiac Rehab    Any Other Special Instructions Will Be Listed Below (If Applicable).     If you need a refill on your cardiac medications before your next appointment, please call your pharmacy.      Hilbert Corrigan, [place]  06/13/2016 10:46 AM    [address], [place], [place]  [address] Phone: [phone]; Fax: [phone]

## 2016-06-13 ENCOUNTER — Ambulatory Visit (INDEPENDENT_AMBULATORY_CARE_PROVIDER_SITE_OTHER): Payer: Medicaid Other | Admitting: Physician Assistant

## 2016-06-13 ENCOUNTER — Encounter: Payer: Self-pay | Admitting: Physician Assistant

## 2016-06-13 VITALS — BP 110/66 | HR 80 | Ht 70.0 in | Wt 321.5 lb

## 2016-06-13 DIAGNOSIS — E785 Hyperlipidemia, unspecified: Secondary | ICD-10-CM

## 2016-06-13 DIAGNOSIS — I25118 Atherosclerotic heart disease of native coronary artery with other forms of angina pectoris: Secondary | ICD-10-CM

## 2016-06-13 DIAGNOSIS — I10 Essential (primary) hypertension: Secondary | ICD-10-CM

## 2016-06-13 DIAGNOSIS — E119 Type 2 diabetes mellitus without complications: Secondary | ICD-10-CM

## 2016-06-13 DIAGNOSIS — E1169 Type 2 diabetes mellitus with other specified complication: Secondary | ICD-10-CM

## 2016-06-13 DIAGNOSIS — E669 Obesity, unspecified: Secondary | ICD-10-CM

## 2016-06-13 DIAGNOSIS — G4733 Obstructive sleep apnea (adult) (pediatric): Secondary | ICD-10-CM

## 2016-06-13 DIAGNOSIS — Z9989 Dependence on other enabling machines and devices: Secondary | ICD-10-CM

## 2016-06-13 NOTE — Patient Instructions (Signed)
Medication Instructions:  Your physician recommends that you continue on your current medications as directed. Please refer to the Current Medication list given to you today.   Labwork: Bmet today  Testing/Procedures: None ordered  Follow-Up: Your physician recommends that you schedule a follow-up appointment in: 2 months with Dr.Crenshaw  You have been referred to [facility] Cardiac Rehab    Any Other Special Instructions Will Be Listed Below (If Applicable).     If you need a refill on your cardiac medications before your next appointment, please call your pharmacy.

## 2016-06-15 ENCOUNTER — Emergency Department (HOSPITAL_COMMUNITY): Payer: Medicaid Other

## 2016-06-15 ENCOUNTER — Encounter (HOSPITAL_COMMUNITY): Payer: Self-pay

## 2016-06-15 ENCOUNTER — Emergency Department (HOSPITAL_COMMUNITY)
Admission: EM | Admit: 2016-06-15 | Discharge: 2016-06-15 | Disposition: A | Discharge status: Home / Self Care | Payer: Medicaid Other | Attending: Emergency Medicine | Admitting: Emergency Medicine

## 2016-06-15 DIAGNOSIS — I10 Essential (primary) hypertension: Secondary | ICD-10-CM | POA: Diagnosis not present

## 2016-06-15 DIAGNOSIS — Z79899 Other long term (current) drug therapy: Secondary | ICD-10-CM | POA: Insufficient documentation

## 2016-06-15 DIAGNOSIS — Z7984 Long term (current) use of oral hypoglycemic drugs: Secondary | ICD-10-CM | POA: Diagnosis not present

## 2016-06-15 DIAGNOSIS — R0602 Shortness of breath: Secondary | ICD-10-CM | POA: Diagnosis not present

## 2016-06-15 DIAGNOSIS — R079 Chest pain, unspecified: Secondary | ICD-10-CM | POA: Diagnosis present

## 2016-06-15 DIAGNOSIS — Z7982 Long term (current) use of aspirin: Secondary | ICD-10-CM | POA: Insufficient documentation

## 2016-06-15 DIAGNOSIS — I209 Angina pectoris, unspecified: Secondary | ICD-10-CM

## 2016-06-15 DIAGNOSIS — E1140 Type 2 diabetes mellitus with diabetic neuropathy, unspecified: Secondary | ICD-10-CM | POA: Diagnosis not present

## 2016-06-15 LAB — CBC WITH DIFFERENTIAL/PLATELET
Basophils Absolute: 0 10*3/uL (ref 0.0–0.1)
Basophils Relative: 0 %
Eosinophils Absolute: 0.1 10*3/uL (ref 0.0–0.7)
Eosinophils Relative: 1 %
HCT: 33.7 % — ABNORMAL LOW (ref 39.0–52.0)
Hemoglobin: 10.9 g/dL — ABNORMAL LOW (ref 13.0–17.0)
Lymphocytes Relative: 27 %
Lymphs Abs: 2.5 10*3/uL (ref 0.7–4.0)
MCH: 29.7 pg (ref 26.0–34.0)
MCHC: 32.3 g/dL (ref 30.0–36.0)
MCV: 91.8 fL (ref 78.0–100.0)
Monocytes Absolute: 0.6 10*3/uL (ref 0.1–1.0)
Monocytes Relative: 6 %
Neutro Abs: 5.8 10*3/uL (ref 1.7–7.7)
Neutrophils Relative %: 66 %
Platelets: 225 10*3/uL (ref 150–400)
RBC: 3.67 MIL/uL — ABNORMAL LOW (ref 4.22–5.81)
RDW: 14.4 % (ref 11.5–15.5)
WBC: 9 10*3/uL (ref 4.0–10.5)

## 2016-06-15 LAB — COMPREHENSIVE METABOLIC PANEL
ALT: 21 U/L (ref 17–63)
AST: 24 U/L (ref 15–41)
Albumin: 3.6 g/dL (ref 3.5–5.0)
Alkaline Phosphatase: 45 U/L (ref 38–126)
Anion gap: 13 (ref 5–15)
BUN: 10 mg/dL (ref 6–20)
CO2: 24 mmol/L (ref 22–32)
Calcium: 8.4 mg/dL — ABNORMAL LOW (ref 8.9–10.3)
Chloride: 98 mmol/L — ABNORMAL LOW (ref 101–111)
Creatinine, Ser: 0.8 mg/dL (ref 0.61–1.24)
GFR calc Af Amer: >60 mL/min (ref >60)
GFR calc non Af Amer: >60 mL/min (ref >60)
Glucose, Bld: 292 mg/dL — ABNORMAL HIGH (ref 65–99)
Potassium: 3.2 mmol/L — ABNORMAL LOW (ref 3.5–5.1)
Sodium: 135 mmol/L (ref 135–145)
Total Bilirubin: 0.6 mg/dL (ref 0.3–1.2)
Total Protein: 6.6 g/dL (ref 6.5–8.1)

## 2016-06-15 LAB — I-STAT TROPONIN, ED
Troponin i, poc: 0 ng/mL (ref 0.00–0.08)
Troponin i, poc: 0 ng/mL (ref 0.00–0.08)

## 2016-06-15 LAB — BRAIN NATRIURETIC PEPTIDE: B Natriuretic Peptide: 25 pg/mL (ref 0.0–100.0)

## 2016-06-15 MED ORDER — MORPHINE SULFATE (PF) 4 MG/ML IV SOLN
4.0000 mg | Freq: Once | INTRAVENOUS | Status: AC
  Administered 2016-06-15: 4 mg via INTRAVENOUS
  Filled 2016-06-15: qty 1

## 2016-06-15 MED ORDER — FUROSEMIDE 10 MG/ML IJ SOLN: 80.0000 mg | Freq: Once | INTRAMUSCULAR | Status: DC

## 2016-06-15 MED ORDER — HYDROCODONE-ACETAMINOPHEN 5-325 MG PO TABS: 1.0000 | ORAL_TABLET | ORAL | 0 refills | Status: DC | PRN

## 2016-06-15 MED ORDER — ONDANSETRON HCL 4 MG/2ML IJ SOLN
4.0000 mg | Freq: Once | INTRAMUSCULAR | Status: AC
Start: 2016-06-15 — End: 2016-06-15
  Administered 2016-06-15: 4 mg via INTRAVENOUS
  Filled 2016-06-15: qty 2

## 2016-06-15 NOTE — ED Triage Notes (Signed)
Pt arrived via EMS c/o chest pain/pressure radiating to shoulder pain bilaterally.  Stent placed 05/31/16.  Nausea, SOB.  EMS gave 4mg  Zofran, SL Nitro x1.

## 2016-06-15 NOTE — ED Notes (Signed)
Pt ambulated to bathroom 

## 2016-06-15 NOTE — ED Notes (Signed)
Pt ambulated to bathroom without difficulty.

## 2016-06-15 NOTE — ED Provider Notes (Signed)
[place] DEPT Provider Note   CSN: [id number] Arrival date & time: 06/15/16  0009  By signing my name below, I, Higinio Plan, attest that this documentation has been prepared under the direction and in the presence of Orpah Greek, MD . Electronically Signed: Higinio Plan, Scribe. 06/15/2016. 12:42 AM.  History   Chief Complaint Chief Complaint  Patient presents with  . Chest Pain   The history is provided by the patient. No language interpreter was used.   HPI Comments: MINUS BRIGNER is a 55 y.o. male with PMHx of CAD, HLD, HTN and Type II DM, who presents to the Emergency Department by EMS complaining of gradually worsening, persistent, chest pain described as pressure, headache and blurry vision that began at ~3:30 PM this afternoon and worsened PTA. Pt reports his pain is exacerbated when coughing. He notes associated neck pain, nausea, pain in his right shoulder and "tingling" in his right hand. He states he was given 1 NTG and 4 mg zofran by EMS with mild relief of his chest pain but reports it worsened his headache. He reports he was recently admitted to the [facility] on 05/31/16 for similar chest pain and shortness of breath in which he had a cardiac stent placed; he states this pain feels similar but the pain in his right shoulder is new. Pt notes his dosage of furosemide was recently increased and states the swelling in his legs appears reduced than before.   Past Medical History:  Diagnosis Date  . Back pain   . CAD (coronary artery disease)    Nonobstructive  . Hyperlipidemia   . Hypertension   . IBS (irritable bowel syndrome)   . OSA (obstructive sleep apnea)   . Pancreatitis   . Peptic ulcer   . PUD (peptic ulcer disease)   . Type 2 diabetes mellitus [facility])     Patient Active Problem List   Diagnosis Date Noted  . Type 2 diabetes mellitus with vascular disease ([place]) 05/31/2016  . Normocytic normochromic anemia 05/31/2016  . Chest pain 05/31/2016  . CAD  (coronary artery disease) 01/06/2016  . DVT (deep venous thrombosis) ([place]) 01/06/2016  . Lactic acid acidosis 05/28/2014  . Abdominal pain 05/28/2014  . Nonspecific chest pain 01/29/2014  . Uncontrolled secondary diabetes with peripheral neuropathy ([place]) 01/29/2014  . Morbid obesity ([place]) 01/29/2014  . Snoring 01/29/2014  . Dyslipidemia 01/29/2014  . HTN (hypertension) 01/29/2014  . Abnormal nuclear stress test 01/29/2014    Past Surgical History:  Procedure Laterality Date  . CARDIAC CATHETERIZATION N/A 05/31/2016   Procedure: Left Heart Cath and Coronary Angiography;  Surgeon: Peter M Martinique, MD;  Location: [place] CV LAB;  Service: Cardiovascular;  Laterality: N/A;  . CARDIAC CATHETERIZATION N/A 05/31/2016   Procedure: Intravascular Pressure Wire/FFR Study;  Surgeon: Peter M Martinique, MD;  Location: [place] CV LAB;  Service: Cardiovascular;  Laterality: N/A;  . CARDIAC CATHETERIZATION N/A 05/31/2016   Procedure: Coronary Stent Intervention;  Surgeon: Peter M Martinique, MD;  Location: [place] CV LAB;  Service: Cardiovascular;  Laterality: N/A;  . LEFT HEART CATHETERIZATION WITH CORONARY ANGIOGRAM N/A 02/03/2014   Procedure: LEFT HEART CATHETERIZATION WITH CORONARY ANGIOGRAM;  Surgeon: Pixie Casino, MD;  Location: [facility] CATH LAB;  Service: Cardiovascular;  Laterality: N/A;       Home Medications    Prior to Admission medications   Medication Sig Start Date End Date Taking? Authorizing Provider  amLODipine (NORVASC) 10 MG tablet Take 1 tablet (10 mg total) by  mouth daily. 06/01/14  Yes Hosie Poisson, MD  aspirin 81 MG chewable tablet Chew 1 tablet (81 mg total) by mouth daily. 06/01/16  Yes Elwin Mocha, MD  atorvastatin (LIPITOR) 80 MG tablet Take 1 tablet (80 mg total) by mouth daily. 01/06/16  Yes Lelon Perla, MD  clopidogrel (PLAVIX) 75 MG tablet Take 75 mg by mouth daily.   Yes Historical Provider, MD  famotidine (PEPCID) 20 MG tablet Take 20 mg by mouth 2 (two) times  daily.   Yes Historical Provider, MD  furosemide (LASIX) 80 MG tablet Take 1 tablet (80 mg total) by mouth 2 (two) times daily. 06/01/16  Yes Elwin Mocha, MD  gabapentin (NEURONTIN) 300 MG capsule Take 300 mg by mouth See admin instructions. Take one tablet by mouth in the morning and lunch, then take two at dinner and 1 tablet at bedtime per patient   Yes Historical Provider, MD  glipiZIDE (GLUCOTROL) 10 MG tablet Take 1 tablet by mouth 2 (two) times daily. 11/13/15  Yes Historical Provider, MD  hydrALAZINE (APRESOLINE) 50 MG tablet Take 1 tablet (50 mg total) by mouth every 8 (eight) hours. Patient taking differently: Take 100 mg by mouth 2 (two) times daily.  05/29/14  Yes Hosie Poisson, MD  isosorbide dinitrate (ISORDIL) 30 MG tablet Take 30 mg by mouth at bedtime.   Yes Historical Provider, MD  lisinopril (PRINIVIL,ZESTRIL) 40 MG tablet Take 40 mg by mouth daily.   Yes Historical Provider, MD  metFORMIN (GLUCOPHAGE) 500 MG tablet Take 500 mg by mouth 2 (two) times daily with a meal.   Yes Historical Provider, MD  metoprolol tartrate (LOPRESSOR) 50 MG tablet Take 1 tablet (50 mg total) by mouth 2 (two) times daily. 06/01/16  Yes Elwin Mocha, MD  Potassium Chloride ER (K-TAB) 20 MEQ TBCR Take 1 tablet by mouth daily.  11/10/15  Yes Historical Provider, MD  sertraline (ZOLOFT) 50 MG tablet Take 50 mg by mouth daily.   Yes Historical Provider, MD  sitaGLIPtin (JANUVIA) 100 MG tablet Take 100 mg by mouth daily.   Yes Historical Provider, MD  traZODone (DESYREL) 50 MG tablet Take 50 mg by mouth at bedtime.   Yes Historical Provider, MD  HYDROcodone-acetaminophen (NORCO/VICODIN) 5-325 MG tablet Take 1-2 tablets by mouth every 4 (four) hours as needed for moderate pain. 06/15/16   Orpah Greek, MD    Family History Family History  Problem Relation Age of Onset  . Cancer Father   . Hypertension Mother   . Diabetes Mother   . Hypertension Brother   . Hypertension Sister     Social  History Social History  Substance Use Topics  . Smoking status: Never Smoker  . Smokeless tobacco: Never Used  . Alcohol use No     Allergies   Coconut oil; Aspirin; Glucosamine; and Ibuprofen   Review of Systems Review of Systems  Eyes: Positive for visual disturbance.  Cardiovascular: Positive for chest pain. Negative for leg swelling.  Gastrointestinal: Positive for nausea.  Musculoskeletal: Positive for arthralgias, myalgias and neck pain.  Neurological: Positive for headaches.   Physical Exam Updated Vital Signs BP 105/91   Pulse 74   Resp 19   SpO2 94%   Physical Exam  Constitutional: He is oriented to person, place, and time. He appears well-developed and well-nourished. No distress.  HENT:  Head: Normocephalic and atraumatic.  Right Ear: Hearing normal.  Left Ear: Hearing normal.  Nose: Nose normal.  Mouth/Throat: Oropharynx is clear and  moist and mucous membranes are normal.  Eyes: Conjunctivae and EOM are normal. Pupils are equal, round, and reactive to light.  Neck: Normal range of motion. Neck supple.  Cardiovascular: Regular rhythm, S1 normal and S2 normal.  Exam reveals no gallop and no friction rub.   No murmur heard. Pulmonary/Chest: Effort normal and breath sounds normal. No respiratory distress. He exhibits no tenderness.  Abdominal: Soft. Normal appearance and bowel sounds are normal. There is no hepatosplenomegaly. There is no tenderness. There is no rebound, no guarding, no tenderness at McBurney's point and negative Murphy's sign. No hernia.  Musculoskeletal: Normal range of motion. He exhibits edema.  1+ edema with compression stockings in place  Neurological: He is alert and oriented to person, place, and time. He has normal strength. No cranial nerve deficit or sensory deficit. Coordination normal. GCS eye subscore is 4. GCS verbal subscore is 5. GCS motor subscore is 6.  Skin: Skin is warm, dry and intact. No rash noted. No cyanosis.    Psychiatric: He has a normal mood and affect. His speech is normal and behavior is normal. Thought content normal.  Nursing note and vitals reviewed.  ED Treatments / Results  Labs (all labs ordered are listed, but only abnormal results are displayed) Labs Reviewed  CBC WITH DIFFERENTIAL/PLATELET - Abnormal; Notable for the following:       Result Value   RBC 3.67 (*)    Hemoglobin 10.9 (*)    HCT 33.7 (*)    All other components within normal limits  COMPREHENSIVE METABOLIC PANEL - Abnormal; Notable for the following:    Potassium 3.2 (*)    Chloride 98 (*)    Glucose, Bld 292 (*)    Calcium 8.4 (*)    All other components within normal limits  BRAIN NATRIURETIC PEPTIDE  I-STAT TROPOININ, ED  I-STAT TROPOININ, ED    EKG  EKG Interpretation  Date/Time:  Wednesday June 15 2016 00:23:10 EDT Ventricular Rate:  73 PR Interval:    QRS Duration: 88 QT Interval:  408 QTC Calculation: 450 R Axis:   175 Text Interpretation:  Sinus or ectopic atrial rhythm Anterolateral infarct, age indeterminate T wave abnormality, consider inferolateral ischemia new since last EKG Confirmed by POLLINA  MD, CHRISTOPHER [phone]) on 06/15/2016 12:29:52 AM       Radiology Dg Chest 2 View  Result Date: 06/15/2016 CLINICAL DATA:  Chest pain, shortness of breath and cough tonight. EXAM: CHEST  2 VIEW COMPARISON:  Radiograph 06/04/2016.  Chest CT 05/30/2016 FINDINGS: The cardiomediastinal contours are unchanged with stable mild cardiomegaly. The lungs are clear. Pulmonary vasculature is normal. No consolidation, pleural effusion, or pneumothorax. No acute osseous abnormalities are seen. IMPRESSION: No active cardiopulmonary disease. Electronically Signed   By: Jeb Levering M.D.   On: 06/15/2016 01:16    Procedures Procedures  DIAGNOSTIC STUDIES:  Oxygen Saturation is 96% on RA, normal by my interpretation.    COORDINATION OF CARE:  12:30 AM Discussed treatment plan with pt at bedside and pt  agreed to plan.  Medications Ordered in ED Medications  morphine 4 MG/ML injection 4 mg (4 mg Intravenous Given 06/15/16 0136)  ondansetron (ZOFRAN) injection 4 mg (4 mg Intravenous Given 06/15/16 0135)  morphine 4 MG/ML injection 4 mg (4 mg Intravenous Given 06/15/16 0526)    Initial Impression / Assessment and Plan / ED Course  I have reviewed the triage vital signs and the nursing notes.  Pertinent labs & imaging results that were available during my  care of the patient were reviewed by me and considered in my medical decision making (see chart for details).  Clinical Course    Patient presents to the emergency para for evaluation of chest pain. Patient had cardiac catheterization earlier this month with drug-eluting stent placement. Since the procedure, however, he has continued to have chest pain. Patient was admitted to [facility] regional several days after the original procedure and ruled out. He was followed up in the office and felt to be having noncardiac pain. Patient was given aspirin and nitroglycerin prior to arrival without any improvement of his pain. Initial troponin was negative. Patient did have nonspecific T-wave inversions in his EKG. Case was discussed with Dr. Aundra Dubin, on-call for cardiology. Based on the patient's recent history, it was felt that this was likely noncardiac in nature. Dr. Aundra Dubin felt that if second troponin was negative, patient can be discharged and follow-up in the office. Patient did undergo second troponin which was negative.  I personally performed the services described in this documentation, which was scribed in my presence. The recorded information has been reviewed and is accurate.   Final Clinical Impressions(s) / ED Diagnoses   Chest Pain  New Prescriptions Discharge Medication List as of 06/15/2016  6:36 AM    START taking these medications   Details  HYDROcodone-acetaminophen (NORCO/VICODIN) 5-325 MG tablet Take 1-2 tablets by mouth every  4 (four) hours as needed for moderate pain., Starting Wed 06/15/2016, Print         Orpah Greek, MD 06/16/16 [phone]

## 2016-06-24 ENCOUNTER — Telehealth: Payer: Self-pay | Admitting: *Deleted

## 2016-06-24 NOTE — Telephone Encounter (Signed)
Cardiac Rehab Physician Referral Form for [facility] signed by Dr Stanford Breed and faxed to number provided along with most recent EKG. Diagnosis codes used were I25.10 and R07.9.

## 2016-07-03 ENCOUNTER — Emergency Department (HOSPITAL_COMMUNITY): Payer: Medicaid Other

## 2016-07-03 ENCOUNTER — Encounter (HOSPITAL_COMMUNITY): Payer: Self-pay | Admitting: Emergency Medicine

## 2016-07-03 ENCOUNTER — Emergency Department (HOSPITAL_COMMUNITY)
Admission: EM | Admit: 2016-07-03 | Discharge: 2016-07-03 | Disposition: A | Discharge status: Home / Self Care | Payer: Medicaid Other | Attending: Emergency Medicine | Admitting: Emergency Medicine

## 2016-07-03 DIAGNOSIS — I2510 Atherosclerotic heart disease of native coronary artery without angina pectoris: Secondary | ICD-10-CM | POA: Insufficient documentation

## 2016-07-03 DIAGNOSIS — E119 Type 2 diabetes mellitus without complications: Secondary | ICD-10-CM | POA: Insufficient documentation

## 2016-07-03 DIAGNOSIS — R0602 Shortness of breath: Secondary | ICD-10-CM | POA: Diagnosis not present

## 2016-07-03 DIAGNOSIS — Z7982 Long term (current) use of aspirin: Secondary | ICD-10-CM | POA: Insufficient documentation

## 2016-07-03 DIAGNOSIS — R0789 Other chest pain: Secondary | ICD-10-CM | POA: Diagnosis not present

## 2016-07-03 DIAGNOSIS — I25118 Atherosclerotic heart disease of native coronary artery with other forms of angina pectoris: Secondary | ICD-10-CM

## 2016-07-03 DIAGNOSIS — E785 Hyperlipidemia, unspecified: Secondary | ICD-10-CM

## 2016-07-03 DIAGNOSIS — I10 Essential (primary) hypertension: Secondary | ICD-10-CM | POA: Insufficient documentation

## 2016-07-03 DIAGNOSIS — Z7984 Long term (current) use of oral hypoglycemic drugs: Secondary | ICD-10-CM | POA: Insufficient documentation

## 2016-07-03 DIAGNOSIS — R079 Chest pain, unspecified: Secondary | ICD-10-CM | POA: Diagnosis present

## 2016-07-03 DIAGNOSIS — E876 Hypokalemia: Secondary | ICD-10-CM | POA: Diagnosis not present

## 2016-07-03 LAB — CBC WITH DIFFERENTIAL/PLATELET
Basophils Absolute: 0 10*3/uL (ref 0.0–0.1)
Basophils Relative: 0 %
Eosinophils Absolute: 0.1 10*3/uL (ref 0.0–0.7)
Eosinophils Relative: 1 %
HCT: 36.9 % — ABNORMAL LOW (ref 39.0–52.0)
Hemoglobin: 12 g/dL — ABNORMAL LOW (ref 13.0–17.0)
Lymphocytes Relative: 22 %
Lymphs Abs: 1.8 10*3/uL (ref 0.7–4.0)
MCH: 30.2 pg (ref 26.0–34.0)
MCHC: 32.5 g/dL (ref 30.0–36.0)
MCV: 92.7 fL (ref 78.0–100.0)
Monocytes Absolute: 0.5 10*3/uL (ref 0.1–1.0)
Monocytes Relative: 7 %
Neutro Abs: 5.7 10*3/uL (ref 1.7–7.7)
Neutrophils Relative %: 70 %
Platelets: 221 10*3/uL (ref 150–400)
RBC: 3.98 MIL/uL — ABNORMAL LOW (ref 4.22–5.81)
RDW: 15.3 % (ref 11.5–15.5)
WBC: 8.2 10*3/uL (ref 4.0–10.5)

## 2016-07-03 LAB — I-STAT TROPONIN, ED
Troponin i, poc: 0 ng/mL (ref 0.00–0.08)
Troponin i, poc: 0 ng/mL (ref 0.00–0.08)

## 2016-07-03 LAB — COMPREHENSIVE METABOLIC PANEL
ALT: 29 U/L (ref 17–63)
AST: 36 U/L (ref 15–41)
Albumin: 3.6 g/dL (ref 3.5–5.0)
Alkaline Phosphatase: 51 U/L (ref 38–126)
Anion gap: 15 (ref 5–15)
BUN: 8 mg/dL (ref 6–20)
CO2: 26 mmol/L (ref 22–32)
Calcium: 9.1 mg/dL (ref 8.9–10.3)
Chloride: 98 mmol/L — ABNORMAL LOW (ref 101–111)
Creatinine, Ser: 0.92 mg/dL (ref 0.61–1.24)
GFR calc Af Amer: >60 mL/min (ref >60)
GFR calc non Af Amer: >60 mL/min (ref >60)
Glucose, Bld: 328 mg/dL — ABNORMAL HIGH (ref 65–99)
Potassium: 3 mmol/L — ABNORMAL LOW (ref 3.5–5.1)
Sodium: 139 mmol/L (ref 135–145)
Total Bilirubin: 0.6 mg/dL (ref 0.3–1.2)
Total Protein: 7.1 g/dL (ref 6.5–8.1)

## 2016-07-03 LAB — BRAIN NATRIURETIC PEPTIDE: B Natriuretic Peptide: 16.1 pg/mL (ref 0.0–100.0)

## 2016-07-03 MED ORDER — POTASSIUM CHLORIDE CRYS ER 20 MEQ PO TBCR
80.0000 meq | EXTENDED_RELEASE_TABLET | Freq: Once | ORAL | Status: AC
  Administered 2016-07-03: 80 meq via ORAL
  Filled 2016-07-03: qty 4

## 2016-07-03 MED ORDER — ONDANSETRON HCL 4 MG/2ML IJ SOLN
4.0000 mg | Freq: Once | INTRAMUSCULAR | Status: AC
  Administered 2016-07-03: 4 mg via INTRAVENOUS
  Filled 2016-07-03: qty 2

## 2016-07-03 MED ORDER — POTASSIUM CHLORIDE ER 10 MEQ PO CPCR: 30.0000 meq | ORAL_CAPSULE | Freq: Two times a day (BID) | ORAL | 0 refills | Status: DC

## 2016-07-03 MED ORDER — MORPHINE SULFATE (PF) 4 MG/ML IV SOLN
4.0000 mg | Freq: Once | INTRAVENOUS | Status: AC
  Administered 2016-07-03: 4 mg via INTRAVENOUS
  Filled 2016-07-03: qty 1

## 2016-07-03 MED ORDER — HYDROCODONE-ACETAMINOPHEN 5-325 MG PO TABS
1.0000 | ORAL_TABLET | Freq: Once | ORAL | Status: AC
  Administered 2016-07-03: 1 via ORAL
  Filled 2016-07-03: qty 1

## 2016-07-03 NOTE — Consult Note (Signed)
CARDIOLOGY CONSULT NOTE  Patient ID: [name] MRN: [id number] DOB/AGE: [birth year] 53 y.o.  Admit date: 07/03/2016 Primary Physician: Geannie Risen, MD Referring Physician: Maryan Rued MD  Reason for Consultation: chest pain  HPI: 55 yr old male with CAD and LAD stent on 05/31/16 presented to ED with chest pain. Had some pain yesterday but ignored it. Today noticed left-sided pain radiating into neck and left arm. Had normal cardiac function by echo 8/9. PMH also includes HTN, hyperlipidemia, DVT, and OSA.  ECG showed NSR with nonspecific T wave abnormalities.  Chest xray normal.  Troponins normal x 2.  K low at 3.  At time of my evaluation, pain down to 3/10.    Allergies  Allergen Reactions  . Coconut Oil Hives  . Aspirin Other (See Comments)    Gi ulcers  . Ibuprofen     Made gastric ulcers worse    No current facility-administered medications for this encounter.    Current Outpatient Prescriptions  Medication Sig Dispense Refill  . amLODipine (NORVASC) 10 MG tablet Take 1 tablet (10 mg total) by mouth daily. 30 tablet 1  . aspirin 81 MG chewable tablet Chew 1 tablet (81 mg total) by mouth daily. 30 tablet 0  . atorvastatin (LIPITOR) 80 MG tablet Take 1 tablet (80 mg total) by mouth daily. 90 tablet 3  . clopidogrel (PLAVIX) 75 MG tablet Take 75 mg by mouth daily.    . famotidine (PEPCID) 20 MG tablet Take 20 mg by mouth 2 (two) times daily.    . furosemide (LASIX) 80 MG tablet Take 1 tablet (80 mg total) by mouth 2 (two) times daily. (Patient taking differently: Take 40 mg by mouth 2 (two) times daily. ) 60 tablet 0  . gabapentin (NEURONTIN) 300 MG capsule Take 300 mg by mouth See admin instructions. Take one tablet by mouth in the morning and lunch, then take two at dinner and 1 tablet at bedtime per patient    . glipiZIDE (GLUCOTROL) 10 MG tablet Take 1 tablet by mouth 2 (two) times daily.  5  . hydrALAZINE (APRESOLINE) 50 MG tablet Take 1 tablet  (50 mg total) by mouth every 8 (eight) hours. (Patient taking differently: Take 100 mg by mouth 2 (two) times daily. ) 90 tablet 0  . HYDROcodone-acetaminophen (NORCO/VICODIN) 5-325 MG tablet Take 1-2 tablets by mouth every 4 (four) hours as needed for moderate pain. 10 tablet 0  . isosorbide dinitrate (ISORDIL) 30 MG tablet Take 30 mg by mouth at bedtime.    Marland Kitchen lisinopril (PRINIVIL,ZESTRIL) 40 MG tablet Take 40 mg by mouth daily.    . metFORMIN (GLUCOPHAGE) 500 MG tablet Take 500 mg by mouth 2 (two) times daily with a meal.    . metoprolol tartrate (LOPRESSOR) 50 MG tablet Take 1 tablet (50 mg total) by mouth 2 (two) times daily. 60 tablet 0  . sertraline (ZOLOFT) 50 MG tablet Take 50 mg by mouth daily.    . sitaGLIPtin (JANUVIA) 100 MG tablet Take 100 mg by mouth daily.    . traZODone (DESYREL) 50 MG tablet Take 50 mg by mouth at bedtime.    . potassium chloride (MICRO-K) 10 MEQ CR capsule Take 3 capsules (30 mEq total) by mouth 2 (two) times daily. 180 capsule 0    Past Medical History:  Diagnosis Date  . Back pain   . CAD (coronary artery disease)    Nonobstructive  . Hyperlipidemia   . Hypertension   . IBS (  irritable bowel syndrome)   . OSA (obstructive sleep apnea)   . Pancreatitis   . Peptic ulcer   . PUD (peptic ulcer disease)   . Type 2 diabetes mellitus ([place])     Past Surgical History:  Procedure Laterality Date  . CARDIAC CATHETERIZATION N/A 05/31/2016   Procedure: Left Heart Cath and Coronary Angiography;  Surgeon: Peter M Martinique, MD;  Location: [place] CV LAB;  Service: Cardiovascular;  Laterality: N/A;  . CARDIAC CATHETERIZATION N/A 05/31/2016   Procedure: Intravascular Pressure Wire/FFR Study;  Surgeon: Peter M Martinique, MD;  Location: [place] CV LAB;  Service: Cardiovascular;  Laterality: N/A;  . CARDIAC CATHETERIZATION N/A 05/31/2016   Procedure: Coronary Stent Intervention;  Surgeon: Peter M Martinique, MD;  Location: [place] CV LAB;  Service: Cardiovascular;   Laterality: N/A;  . LEFT HEART CATHETERIZATION WITH CORONARY ANGIOGRAM N/A 02/03/2014   Procedure: LEFT HEART CATHETERIZATION WITH CORONARY ANGIOGRAM;  Surgeon: Pixie Casino, MD;  Location: [facility] CATH LAB;  Service: Cardiovascular;  Laterality: N/A;    Social History   Social History  . Marital status: Single    Spouse name: N/A  . Number of children: 3  . Years of education: 12   Occupational History  . Scientist, product/process development   Social History Main Topics  . Smoking status: Never Smoker  . Smokeless tobacco: Never Used  . Alcohol use No  . Drug use: No  . Sexual activity: Not on file   Other Topics Concern  . Not on file   Social History Narrative  . No narrative on file     No family history of premature CAD in 1st degree relatives.  Prior to Admission medications   Medication Sig Start Date End Date Taking? Authorizing Provider  amLODipine (NORVASC) 10 MG tablet Take 1 tablet (10 mg total) by mouth daily. 06/01/14  Yes Hosie Poisson, MD  aspirin 81 MG chewable tablet Chew 1 tablet (81 mg total) by mouth daily. 06/01/16  Yes Elwin Mocha, MD  atorvastatin (LIPITOR) 80 MG tablet Take 1 tablet (80 mg total) by mouth daily. 01/06/16  Yes Lelon Perla, MD  clopidogrel (PLAVIX) 75 MG tablet Take 75 mg by mouth daily.   Yes Historical Provider, MD  famotidine (PEPCID) 20 MG tablet Take 20 mg by mouth 2 (two) times daily.   Yes Historical Provider, MD  furosemide (LASIX) 80 MG tablet Take 1 tablet (80 mg total) by mouth 2 (two) times daily. Patient taking differently: Take 40 mg by mouth 2 (two) times daily.  06/01/16  Yes Elwin Mocha, MD  gabapentin (NEURONTIN) 300 MG capsule Take 300 mg by mouth See admin instructions. Take one tablet by mouth in the morning and lunch, then take two at dinner and 1 tablet at bedtime per patient   Yes Historical Provider, MD  glipiZIDE (GLUCOTROL) 10 MG tablet Take 1 tablet by mouth 2 (two) times daily. 11/13/15  Yes Historical  Provider, MD  hydrALAZINE (APRESOLINE) 50 MG tablet Take 1 tablet (50 mg total) by mouth every 8 (eight) hours. Patient taking differently: Take 100 mg by mouth 2 (two) times daily.  05/29/14  Yes Hosie Poisson, MD  HYDROcodone-acetaminophen (NORCO/VICODIN) 5-325 MG tablet Take 1-2 tablets by mouth every 4 (four) hours as needed for moderate pain. 06/15/16  Yes Orpah Greek, MD  isosorbide dinitrate (ISORDIL) 30 MG tablet Take 30 mg by mouth at bedtime.   Yes Historical Provider, MD  lisinopril (PRINIVIL,ZESTRIL) 40 MG tablet  Take 40 mg by mouth daily.   Yes Historical Provider, MD  metFORMIN (GLUCOPHAGE) 500 MG tablet Take 500 mg by mouth 2 (two) times daily with a meal.   Yes Historical Provider, MD  metoprolol tartrate (LOPRESSOR) 50 MG tablet Take 1 tablet (50 mg total) by mouth 2 (two) times daily. 06/01/16  Yes Elwin Mocha, MD  sertraline (ZOLOFT) 50 MG tablet Take 50 mg by mouth daily.   Yes Historical Provider, MD  sitaGLIPtin (JANUVIA) 100 MG tablet Take 100 mg by mouth daily.   Yes Historical Provider, MD  traZODone (DESYREL) 50 MG tablet Take 50 mg by mouth at bedtime.   Yes Historical Provider, MD  potassium chloride (MICRO-K) 10 MEQ CR capsule Take 3 capsules (30 mEq total) by mouth 2 (two) times daily. 07/03/16   Blanchie Dessert, MD     Review of systems complete and found to be negative unless listed above in HPI     Physical exam Blood pressure 116/69, pulse 69, resp. rate 18, SpO2 95 %. General: NAD Neck: No JVD, no thyromegaly or thyroid nodule.  Lungs: Clear to auscultation bilaterally with normal respiratory effort. CV: Nondisplaced PMI. Regular rate and rhythm, normal S1/S2, no S3/S4, no murmur.  No peripheral edema.  No carotid bruit.  Normal pedal pulses.  Abdomen: Soft, nontender, no hepatosplenomegaly, no distention.  Skin: Intact without lesions or rashes.  Neurologic: Alert and oriented x 3.  Psych: Normal affect. Extremities: No clubbing or cyanosis.    HEENT: Normal.   ECG: Most recent ECG reviewed.  Labs:   Lab Results  Component Value Date   WBC 8.2 07/03/2016   HGB 12.0 (L) 07/03/2016   HCT 36.9 (L) 07/03/2016   MCV 92.7 07/03/2016   PLT 221 07/03/2016    Recent Labs Lab 07/03/16 0955  NA 139  K 3.0*  CL 98*  CO2 26  BUN 8  CREATININE 0.92  CALCIUM 9.1  PROT 7.1  BILITOT 0.6  ALKPHOS 51  ALT 29  AST 36  GLUCOSE 328*   Lab Results  Component Value Date   TROPONINI <0.03 05/31/2016    Lab Results  Component Value Date   CHOL 82 05/31/2016   CHOL 88 (L) 02/01/2016   CHOL 213 (H) 12/23/2009   Lab Results  Component Value Date   HDL 26 (L) 05/31/2016   HDL 27 (L) 02/01/2016   HDL 50 12/23/2009   Lab Results  Component Value Date   LDLCALC 7 05/31/2016   LDLCALC 5 02/01/2016   LDLCALC 102 (H) 12/23/2009   Lab Results  Component Value Date   TRIG 244 (H) 05/31/2016   TRIG 281 (H) 02/01/2016   TRIG 303 (H) 12/23/2009   Lab Results  Component Value Date   CHOLHDL 3.2 05/31/2016   CHOLHDL 3.3 02/01/2016   CHOLHDL 4.3 Ratio 12/23/2009   No results found for: LDLDIRECT       Studies: Dg Chest 2 View  Result Date: 07/03/2016 CLINICAL DATA:  Chest pain EXAM: CHEST  2 VIEW COMPARISON:  None. FINDINGS: The heart size and mediastinal contours are within normal limits. Both lungs are clear. The visualized skeletal structures are unremarkable. IMPRESSION: No active cardiopulmonary disease. Electronically Signed   By: Dorise Bullion III M.D   On: 07/03/2016 10:27    ASSESSMENT AND PLAN:  1. Chest pain: Likely musculoskeletal and due to hypokalemia. K being repleted.   2. CAD s/p LAD stent: Stable. Troponins normal. Continue DAPT, beta blocker, nitrates, and statin.  3. HTN: Controlled. No changes.  4. Hyperlipidemia: Continue statin.  Dispo: Can be discharged from my standpoint.   Signed: Kate Sable, M.D., F.A.C.C.  07/03/2016, 2:20 PM

## 2016-07-03 NOTE — ED Provider Notes (Addendum)
[place] DEPT Provider Note   CSN: [id number] Arrival date & time: 07/03/16  [id number]     History   Chief Complaint Chief Complaint  Patient presents with  . Chest Pain  . Dizziness    HPI [name] is a 55 y.o. male.  Patient is a 55 year old male with a history of coronary artery disease status post stent placed in August of this year, diabetes, DVT, hypertension, GERD presenting today with chest pain. Patient states he had several episodes of dizziness and near syncope over the last 24 hours but this morning after taking out the trash and walking up stairs he developed significant left-sided chest discomfort that radiated down his left arm and into his throat. It caused him to be nauseated and short of breath. He took a nitroglycerin at home which caused him to have a severe headache but did not improve the pain. Patient states he gets this pain fairly regularly but not usually this badly. He was seen at [facility] regional 6 days ago for syncope and at that time was found to be hypokalemic. He had a CTA of his chest done at that time which showed no signs of PE. He continues to take Plavix and has been taking all of his medications that are prescribed to him. He denies any significant weight gain or leg swelling at this time but does complain of orthopnea and has to sleep upright. Patient recently underwent an EGD 4 days ago at [facility] and at that time it was found to be normal with no signs of ulcer or soft stricture.  Currently chest pain as a 4 out of 10 and he feels that is starting to improve.    Chest Pain   Associated symptoms include dizziness.  Dizziness  Associated symptoms: chest pain     Past Medical History:  Diagnosis Date  . Back pain   . CAD (coronary artery disease)    Nonobstructive  . Hyperlipidemia   . Hypertension   . IBS (irritable bowel syndrome)   . OSA (obstructive sleep apnea)   . Pancreatitis   . Peptic ulcer   . PUD (peptic  ulcer disease)   . Type 2 diabetes mellitus [facility])     Patient Active Problem List   Diagnosis Date Noted  . Type 2 diabetes mellitus with vascular disease ([place]) 05/31/2016  . Normocytic normochromic anemia 05/31/2016  . Chest pain 05/31/2016  . CAD (coronary artery disease) 01/06/2016  . DVT (deep venous thrombosis) ([place]) 01/06/2016  . Lactic acid acidosis 05/28/2014  . Abdominal pain 05/28/2014  . Nonspecific chest pain 01/29/2014  . Uncontrolled secondary diabetes with peripheral neuropathy ([place]) 01/29/2014  . Morbid obesity ([place]) 01/29/2014  . Snoring 01/29/2014  . Dyslipidemia 01/29/2014  . HTN (hypertension) 01/29/2014  . Abnormal nuclear stress test 01/29/2014    Past Surgical History:  Procedure Laterality Date  . CARDIAC CATHETERIZATION N/A 05/31/2016   Procedure: Left Heart Cath and Coronary Angiography;  Surgeon: Peter M Martinique, MD;  Location: [place] CV LAB;  Service: Cardiovascular;  Laterality: N/A;  . CARDIAC CATHETERIZATION N/A 05/31/2016   Procedure: Intravascular Pressure Wire/FFR Study;  Surgeon: Peter M Martinique, MD;  Location: [place] CV LAB;  Service: Cardiovascular;  Laterality: N/A;  . CARDIAC CATHETERIZATION N/A 05/31/2016   Procedure: Coronary Stent Intervention;  Surgeon: Peter M Martinique, MD;  Location: [place] CV LAB;  Service: Cardiovascular;  Laterality: N/A;  . LEFT HEART CATHETERIZATION WITH CORONARY ANGIOGRAM N/A 02/03/2014  Procedure: LEFT HEART CATHETERIZATION WITH CORONARY ANGIOGRAM;  Surgeon: Pixie Casino, MD;  Location: [facility] CATH LAB;  Service: Cardiovascular;  Laterality: N/A;       Home Medications    Prior to Admission medications   Medication Sig Start Date End Date Taking? Authorizing Provider  amLODipine (NORVASC) 10 MG tablet Take 1 tablet (10 mg total) by mouth daily. 06/01/14   Hosie Poisson, MD  aspirin 81 MG chewable tablet Chew 1 tablet (81 mg total) by mouth daily. 06/01/16   Elwin Mocha, MD  atorvastatin (LIPITOR) 80 MG  tablet Take 1 tablet (80 mg total) by mouth daily. 01/06/16   Lelon Perla, MD  clopidogrel (PLAVIX) 75 MG tablet Take 75 mg by mouth daily.    Historical Provider, MD  famotidine (PEPCID) 20 MG tablet Take 20 mg by mouth 2 (two) times daily.    Historical Provider, MD  furosemide (LASIX) 80 MG tablet Take 1 tablet (80 mg total) by mouth 2 (two) times daily. 06/01/16   Elwin Mocha, MD  gabapentin (NEURONTIN) 300 MG capsule Take 300 mg by mouth See admin instructions. Take one tablet by mouth in the morning and lunch, then take two at dinner and 1 tablet at bedtime per patient    Historical Provider, MD  glipiZIDE (GLUCOTROL) 10 MG tablet Take 1 tablet by mouth 2 (two) times daily. 11/13/15   Historical Provider, MD  hydrALAZINE (APRESOLINE) 50 MG tablet Take 1 tablet (50 mg total) by mouth every 8 (eight) hours. Patient taking differently: Take 100 mg by mouth 2 (two) times daily.  05/29/14   Hosie Poisson, MD  HYDROcodone-acetaminophen (NORCO/VICODIN) 5-325 MG tablet Take 1-2 tablets by mouth every 4 (four) hours as needed for moderate pain. 06/15/16   Orpah Greek, MD  isosorbide dinitrate (ISORDIL) 30 MG tablet Take 30 mg by mouth at bedtime.    Historical Provider, MD  lisinopril (PRINIVIL,ZESTRIL) 40 MG tablet Take 40 mg by mouth daily.    Historical Provider, MD  metFORMIN (GLUCOPHAGE) 500 MG tablet Take 500 mg by mouth 2 (two) times daily with a meal.    Historical Provider, MD  metoprolol tartrate (LOPRESSOR) 50 MG tablet Take 1 tablet (50 mg total) by mouth 2 (two) times daily. 06/01/16   Elwin Mocha, MD  Potassium Chloride ER (K-TAB) 20 MEQ TBCR Take 1 tablet by mouth daily.  11/10/15   Historical Provider, MD  sertraline (ZOLOFT) 50 MG tablet Take 50 mg by mouth daily.    Historical Provider, MD  sitaGLIPtin (JANUVIA) 100 MG tablet Take 100 mg by mouth daily.    Historical Provider, MD  traZODone (DESYREL) 50 MG tablet Take 50 mg by mouth at bedtime.    Historical Provider, MD     Family History Family History  Problem Relation Age of Onset  . Cancer Father   . Hypertension Mother   . Diabetes Mother   . Hypertension Brother   . Hypertension Sister     Social History Social History  Substance Use Topics  . Smoking status: Never Smoker  . Smokeless tobacco: Never Used  . Alcohol use No     Allergies   Coconut oil; Aspirin; Glucosamine; and Ibuprofen   Review of Systems Review of Systems  Cardiovascular: Positive for chest pain.  Neurological: Positive for dizziness.  All other systems reviewed and are negative.    Physical Exam Updated Vital Signs BP 119/71   Pulse 88   Resp 25   SpO2 96%  Physical Exam  Constitutional: He is oriented to person, place, and time. He appears well-developed and well-nourished. No distress.  Morbidly obese  HENT:  Head: Normocephalic and atraumatic.  Mouth/Throat: Oropharynx is clear and moist.  Eyes: Conjunctivae and EOM are normal. Pupils are equal, round, and reactive to light.  Neck: Normal range of motion. Neck supple.  Cardiovascular: Normal rate, regular rhythm and intact distal pulses.   No murmur heard. Pulses are equal in bilateral upper extremities  Pulmonary/Chest: Effort normal and breath sounds normal. No respiratory distress. He has no wheezes. He has no rales.  Abdominal: Soft. He exhibits no distension. There is no tenderness. There is no rebound and no guarding.  Musculoskeletal: Normal range of motion. He exhibits edema. He exhibits no tenderness.  Compression stockings are on 1+ edema bilaterally  Neurological: He is alert and oriented to person, place, and time.  Skin: Skin is warm and dry. No rash noted. No erythema.  Psychiatric: He has a normal mood and affect. His behavior is normal.  Nursing note and vitals reviewed.    ED Treatments / Results  Labs (all labs ordered are listed, but only abnormal results are displayed) Labs Reviewed  CBC WITH DIFFERENTIAL/PLATELET -  Abnormal; Notable for the following:       Result Value   RBC 3.98 (*)    Hemoglobin 12.0 (*)    HCT 36.9 (*)    All other components within normal limits  COMPREHENSIVE METABOLIC PANEL - Abnormal; Notable for the following:    Potassium 3.0 (*)    Chloride 98 (*)    Glucose, Bld 328 (*)    All other components within normal limits  BRAIN NATRIURETIC PEPTIDE  I-STAT TROPOININ, ED    EKG  EKG Interpretation  Date/Time:  Sunday July 03 2016 09:39:57 EDT Ventricular Rate:  85 PR Interval:    QRS Duration: 84 QT Interval:  452 QTC Calculation: 538 R Axis:   -22 Text Interpretation:  Sinus rhythm Borderline left axis deviation Borderline T wave abnormalities Prolonged QT interval No significant change since last tracing Confirmed by Maryan Rued  MD, Loree Fee ([id number]) on 07/03/2016 9:53:33 AM       Radiology Dg Chest 2 View  Result Date: 07/03/2016 CLINICAL DATA:  Chest pain EXAM: CHEST  2 VIEW COMPARISON:  None. FINDINGS: The heart size and mediastinal contours are within normal limits. Both lungs are clear. The visualized skeletal structures are unremarkable. IMPRESSION: No active cardiopulmonary disease. Electronically Signed   By: Dorise Bullion III M.D   On: 07/03/2016 10:27    Procedures Procedures (including critical care time)  Medications Ordered in ED Medications  potassium chloride SA (K-DUR,KLOR-CON) CR tablet 80 mEq (not administered)  morphine 4 MG/ML injection 4 mg (4 mg Intravenous Given 07/03/16 1008)  ondansetron (ZOFRAN) injection 4 mg (4 mg Intravenous Given 07/03/16 1006)     Initial Impression / Assessment and Plan / ED Course  I have reviewed the triage vital signs and the nursing notes.  Pertinent labs & imaging results that were available during my care of the patient were reviewed by me and considered in my medical decision making (see chart for details).  Clinical Course    Patient is a 55 year old male with multiple medical problems  presenting today with recurrent chest pain that's worse than normal. The pain worse today after he took the trash out and walk back up the stairs. A caused shortness of breath and nausea. He also had radiation of the pain to  the left neck and left upper extremity.  Patient recently with EGD which was normal 4 days ago and evaluation in the emergency room at [facility] regional with a CT of his chest which was negative for PE. Low suspicion for PE or GI cause of his symptoms today. Concern for potential ACS as the cause of his pain versus musculoskeletal. Patient's EKG today is unchanged from prior. Troponin, BNP, CBC and BMP pending. Patient given aspirin prior to arrival and he has Selena Batten taking his Plavix today. Patient given morphine for pain 10:39 AM Trop neg and CBC wnl.  CMP with hypokalemia of 3.0 and replaced with oral potassium.  Pt does take 120mg  of lasix daily and 44mEq of potassium. BNP wnl.  Will consult cards for further advise.  2:09 PM  second troponin is negative. Pain is resolved. At this time and feel that patient is safe for discharge home. He has a follow-up with Dr. Stanford Breed syndrome but recommended that he call tomorrow to ensure versus close follow-up. Also patient has difficulty swallowing his potassium pills so changed to Micro-K and increased to 64mEq per day as on 35mEq pt still hypokalemic.  Final Clinical Impressions(s) / ED Diagnoses   Final diagnoses:  Hypokalemia  Atypical chest pain    New Prescriptions New Prescriptions   POTASSIUM CHLORIDE (MICRO-K) 10 MEQ CR CAPSULE    Take 3 capsules (30 mEq total) by mouth 2 (two) times daily.     Blanchie Dessert, MD 07/03/16 1058    Blanchie Dessert, MD 07/03/16 [phone]

## 2016-07-03 NOTE — ED Triage Notes (Signed)
Pt reports dizziness and gait problem starting yesterday. New onset centralized chest pain radiating to left arm. Pt has hx of cardiac stent placed in August. Pt reports this pain feels the same as it did when he had his stent placed. 324 ASA given. 1nitro given pain currently 4/10 to chest. Pain to head 7/10. 20G  PIV placed to L hand.

## 2016-08-16 ENCOUNTER — Encounter: Payer: Self-pay | Admitting: Cardiology

## 2016-08-24 NOTE — Progress Notes (Signed)
HPI: HPI: FU CAD. Carotid Dopplers May 2017 showed 40-59% bilateral stenosis. Chest CT August 2017 showed no pulmonary embolus and normal thoracic aorta. Cardiac catheterization August 2017 showed a 70% mid LAD which was treated with a drug-eluting stent. Echocardiogram August 2017 showed normal LV systolic function. Lower extremity venous Dopplers August 2017 showed no DVT. He apparently was admitted to [facility] regional the day after the above discharge for chest pain and ruled out. He has had difficulties with hypokalemia. Has been seen in ER with recurrent episodes of chest pain. CTA 9/17 showed no pulmonary embolus. Patient was apparently an [place] regional recently with acute kidney injury and his diuretics were reduced as was his potassium. Since last seen he notes some dyspnea on exertion. He has pain in his left chest and shoulder. It occurs predominantly at night and this is described as a shooting pain. He does not have exertional chest pain. His symptoms lasted an hour and resolved spontaneously.  Current Outpatient Prescriptions  Medication Sig Dispense Refill  . aspirin 81 MG chewable tablet Chew 1 tablet (81 mg total) by mouth daily. 30 tablet 0  . atorvastatin (LIPITOR) 80 MG tablet Take 1 tablet (80 mg total) by mouth daily. 90 tablet 3  . clopidogrel (PLAVIX) 75 MG tablet Take 75 mg by mouth daily.    . famotidine (PEPCID) 20 MG tablet Take 20 mg by mouth 2 (two) times daily.    . furosemide (LASIX) 40 MG tablet Take 40 mg by mouth 2 (two) times daily.    Marland Kitchen gabapentin (NEURONTIN) 300 MG capsule Take 300 mg by mouth See admin instructions. Take one tablet by mouth in the morning and lunch, then take two at dinner and 1 tablet at bedtime per patient    . glipiZIDE (GLUCOTROL) 10 MG tablet Take 1 tablet by mouth 2 (two) times daily.  5  . hydrALAZINE (APRESOLINE) 50 MG tablet Take 1 tablet (50 mg total) by mouth every 8 (eight) hours. (Patient taking differently: Take 100 mg  by mouth 2 (two) times daily. ) 90 tablet 0  . isosorbide dinitrate (ISORDIL) 30 MG tablet Take 30 mg by mouth at bedtime.    Marland Kitchen lisinopril (PRINIVIL,ZESTRIL) 40 MG tablet Take 40 mg by mouth daily.    . metFORMIN (GLUCOPHAGE) 500 MG tablet Take 500 mg by mouth 2 (two) times daily with a meal.    . metoprolol tartrate (LOPRESSOR) 50 MG tablet Take 1 tablet (50 mg total) by mouth 2 (two) times daily. 60 tablet 0  . potassium chloride (MICRO-K) 10 MEQ CR capsule Take 3 capsules (30 mEq total) by mouth 2 (two) times daily. 180 capsule 0  . sertraline (ZOLOFT) 50 MG tablet Take 50 mg by mouth daily.    . sitaGLIPtin (JANUVIA) 100 MG tablet Take 100 mg by mouth daily.    . traZODone (DESYREL) 50 MG tablet Take 50 mg by mouth at bedtime.     No current facility-administered medications for this visit.      Past Medical History:  Diagnosis Date  . Back pain   . CAD (coronary artery disease)    Nonobstructive  . Hyperlipidemia   . Hypertension   . IBS (irritable bowel syndrome)   . OSA (obstructive sleep apnea)   . Pancreatitis   . Peptic ulcer   . PUD (peptic ulcer disease)   . Type 2 diabetes mellitus ([place])     Past Surgical History:  Procedure Laterality Date  .  CARDIAC CATHETERIZATION N/A 05/31/2016   Procedure: Left Heart Cath and Coronary Angiography;  Surgeon: Peter M Martinique, MD;  Location: [place] CV LAB;  Service: Cardiovascular;  Laterality: N/A;  . CARDIAC CATHETERIZATION N/A 05/31/2016   Procedure: Intravascular Pressure Wire/FFR Study;  Surgeon: Peter M Martinique, MD;  Location: [place] CV LAB;  Service: Cardiovascular;  Laterality: N/A;  . CARDIAC CATHETERIZATION N/A 05/31/2016   Procedure: Coronary Stent Intervention;  Surgeon: Peter M Martinique, MD;  Location: [place] CV LAB;  Service: Cardiovascular;  Laterality: N/A;  . LEFT HEART CATHETERIZATION WITH CORONARY ANGIOGRAM N/A 02/03/2014   Procedure: LEFT HEART CATHETERIZATION WITH CORONARY ANGIOGRAM;  Surgeon: Pixie Casino, MD;  Location: [facility] CATH LAB;  Service: Cardiovascular;  Laterality: N/A;    Social History   Social History  . Marital status: Single    Spouse name: N/A  . Number of children: 3  . Years of education: 12   Occupational History  . Scientist, product/process development   Social History Main Topics  . Smoking status: Never Smoker  . Smokeless tobacco: Never Used  . Alcohol use No  . Drug use: No  . Sexual activity: Not on file   Other Topics Concern  . Not on file   Social History Narrative  . No narrative on file    Family History  Problem Relation Age of Onset  . Cancer Father   . Hypertension Mother   . Diabetes Mother   . Hypertension Brother   . Hypertension Sister     ROS: no fevers or chills, productive cough, hemoptysis, dysphasia, odynophagia, melena, hematochezia, dysuria, hematuria, rash, seizure activity, orthopnea, PND, pedal edema, claudication. Remaining systems are negative.  Physical Exam: Well-developed obese in no acute distress.  Skin is warm and dry.  HEENT is normal.  Neck is supple.  Chest is clear to auscultation with normal expansion.  Cardiovascular exam is regular rate and rhythm.  Abdominal exam nontender or distended. No masses palpated. Extremities show no edema. neuro grossly intact  ECG-Sinus rhythm, right axis deviation, nonspecific ST changes.  A/P  1 coronary artery disease-continue aspirin, Plavix and statin.  2 hypertension-blood pressure controlled. Continue present medications. Patient had recent renal insufficiency and increased potassium. Will check potassium and renal function.   3 hyperlipidemia-continue statin.   4 obstructive sleep apnea  5 morbid obesity  6 carotid artery disease-continue aspirin and statin. Follow-up carotid Dopplers May 2018.  7 chest pain-EKG is unremarkable and symptoms atypical. We'll not pursue further ischemia evaluation at this point.   Kirk Ruths, MD

## 2016-08-30 ENCOUNTER — Ambulatory Visit (INDEPENDENT_AMBULATORY_CARE_PROVIDER_SITE_OTHER): Payer: Medicaid Other | Admitting: Cardiology

## 2016-08-30 ENCOUNTER — Encounter: Payer: Self-pay | Admitting: Cardiology

## 2016-08-30 VITALS — BP 144/72 | HR 82 | Ht 70.0 in | Wt 308.0 lb

## 2016-08-30 DIAGNOSIS — I2510 Atherosclerotic heart disease of native coronary artery without angina pectoris: Secondary | ICD-10-CM | POA: Diagnosis not present

## 2016-08-30 DIAGNOSIS — R0789 Other chest pain: Secondary | ICD-10-CM | POA: Diagnosis not present

## 2016-08-30 DIAGNOSIS — E876 Hypokalemia: Secondary | ICD-10-CM | POA: Diagnosis not present

## 2016-08-30 LAB — BASIC METABOLIC PANEL
BUN: 14 mg/dL (ref 7–25)
CO2: 25 mmol/L (ref 20–31)
Calcium: 9.9 mg/dL (ref 8.6–10.3)
Chloride: 100 mmol/L (ref 98–110)
Creat: 0.92 mg/dL (ref 0.70–1.33)
Glucose, Bld: 190 mg/dL — ABNORMAL HIGH (ref 65–99)
Potassium: 3.7 mmol/L (ref 3.5–5.3)
Sodium: 139 mmol/L (ref 135–146)

## 2016-08-30 NOTE — Patient Instructions (Signed)
Medication Instructions:   NO CHANGE  Labwork:  Your physician recommends that you HAVE LAB WORK TODAY  Follow-Up:  Your physician recommends that you schedule a follow-up appointment in: 3 MONTHS WITH DR CRENSHAW      

## 2016-09-16 ENCOUNTER — Emergency Department (HOSPITAL_COMMUNITY): Payer: Medicaid Other

## 2016-09-16 ENCOUNTER — Emergency Department (HOSPITAL_COMMUNITY)
Admission: EM | Admit: 2016-09-16 | Discharge: 2016-09-16 | Disposition: A | Discharge status: Home / Self Care | Payer: Medicaid Other | Attending: Emergency Medicine | Admitting: Emergency Medicine

## 2016-09-16 ENCOUNTER — Encounter (HOSPITAL_COMMUNITY): Payer: Self-pay

## 2016-09-16 DIAGNOSIS — M5432 Sciatica, left side: Secondary | ICD-10-CM | POA: Diagnosis not present

## 2016-09-16 DIAGNOSIS — M791 Myalgia: Secondary | ICD-10-CM | POA: Diagnosis not present

## 2016-09-16 DIAGNOSIS — M7918 Myalgia, other site: Secondary | ICD-10-CM

## 2016-09-16 DIAGNOSIS — E1140 Type 2 diabetes mellitus with diabetic neuropathy, unspecified: Secondary | ICD-10-CM | POA: Insufficient documentation

## 2016-09-16 DIAGNOSIS — M545 Low back pain: Secondary | ICD-10-CM | POA: Diagnosis present

## 2016-09-16 DIAGNOSIS — Z79899 Other long term (current) drug therapy: Secondary | ICD-10-CM | POA: Diagnosis not present

## 2016-09-16 DIAGNOSIS — I2510 Atherosclerotic heart disease of native coronary artery without angina pectoris: Secondary | ICD-10-CM | POA: Diagnosis not present

## 2016-09-16 DIAGNOSIS — Z7984 Long term (current) use of oral hypoglycemic drugs: Secondary | ICD-10-CM | POA: Insufficient documentation

## 2016-09-16 DIAGNOSIS — Z7982 Long term (current) use of aspirin: Secondary | ICD-10-CM | POA: Diagnosis not present

## 2016-09-16 DIAGNOSIS — I10 Essential (primary) hypertension: Secondary | ICD-10-CM | POA: Insufficient documentation

## 2016-09-16 LAB — CBG MONITORING, ED: Glucose-Capillary: 175 mg/dL — ABNORMAL HIGH (ref 65–99)

## 2016-09-16 MED ORDER — DEXAMETHASONE SODIUM PHOSPHATE 10 MG/ML IJ SOLN
10.0000 mg | Freq: Once | INTRAMUSCULAR | Status: AC
  Administered 2016-09-16: 10 mg via INTRAMUSCULAR
  Filled 2016-09-16: qty 1

## 2016-09-16 MED ORDER — HYDROMORPHONE HCL 2 MG/ML IJ SOLN
1.0000 mg | Freq: Once | INTRAMUSCULAR | Status: AC
  Administered 2016-09-16: 1 mg via INTRAMUSCULAR
  Filled 2016-09-16: qty 1

## 2016-09-16 MED ORDER — HYDROCODONE-ACETAMINOPHEN 5-325 MG PO TABS: 1.0000 | ORAL_TABLET | ORAL | 0 refills | Status: DC | PRN

## 2016-09-16 NOTE — ED Triage Notes (Signed)
Pt reports he has been having trouble with his back and spine. He states yesterday his right hip popped and is now causing pain. Pt also states he has lumps bilaterally in his armpits. Pt states he does have diabetes.

## 2016-09-16 NOTE — ED Provider Notes (Signed)
[facility] DEPT Provider Note   CSN: [id number] Arrival date & time: 09/16/16  [id number]     History   Chief Complaint Chief Complaint  Patient presents with  . Hip Pain    HPI [name] is a 55 y.o. male.  HPI 55 year old male with past medical history as below who presents with right hip and lower back pain. The patient states that he has chronic, mild, lower back pain. He was playing with his dog yesterday when he turned and felt a popping sensation in his right hip and posterior buttocks. He states that the pain as an aching, throbbing, stinging sensation intermittently shoots down the back of his right leg. He has intermittent numbness of the leg but denies any weakness. Denies any current numbness. Denies recent fevers, chills, or other symptoms. He denies any change in his back pain and all of his pain is now paraspinal and to the right buttocks area. Denies any recent fevers or chills. Has had no previous surgeries regimentation of his back. Denies any loss of bowel or bladder function. Denies any history of IV drug use.  Past Medical History:  Diagnosis Date  . Back pain   . CAD (coronary artery disease)    Nonobstructive  . Hyperlipidemia   . Hypertension   . IBS (irritable bowel syndrome)   . OSA (obstructive sleep apnea)   . Pancreatitis   . Peptic ulcer   . PUD (peptic ulcer disease)   . Type 2 diabetes mellitus [facility])     Patient Active Problem List   Diagnosis Date Noted  . Type 2 diabetes mellitus with vascular disease ([place]) 05/31/2016  . Normocytic normochromic anemia 05/31/2016  . Chest pain 05/31/2016  . CAD (coronary artery disease) 01/06/2016  . DVT (deep venous thrombosis) ([place]) 01/06/2016  . Lactic acid acidosis 05/28/2014  . Abdominal pain 05/28/2014  . Nonspecific chest pain 01/29/2014  . Uncontrolled secondary diabetes with peripheral neuropathy ([place]) 01/29/2014  . Morbid obesity ([place]) 01/29/2014  . Snoring 01/29/2014  . Dyslipidemia  01/29/2014  . HTN (hypertension) 01/29/2014  . Abnormal nuclear stress test 01/29/2014    Past Surgical History:  Procedure Laterality Date  . CARDIAC CATHETERIZATION N/A 05/31/2016   Procedure: Left Heart Cath and Coronary Angiography;  Surgeon: Peter M Martinique, MD;  Location: [place] CV LAB;  Service: Cardiovascular;  Laterality: N/A;  . CARDIAC CATHETERIZATION N/A 05/31/2016   Procedure: Intravascular Pressure Wire/FFR Study;  Surgeon: Peter M Martinique, MD;  Location: [place] CV LAB;  Service: Cardiovascular;  Laterality: N/A;  . CARDIAC CATHETERIZATION N/A 05/31/2016   Procedure: Coronary Stent Intervention;  Surgeon: Peter M Martinique, MD;  Location: [place] CV LAB;  Service: Cardiovascular;  Laterality: N/A;  . LEFT HEART CATHETERIZATION WITH CORONARY ANGIOGRAM N/A 02/03/2014   Procedure: LEFT HEART CATHETERIZATION WITH CORONARY ANGIOGRAM;  Surgeon: Pixie Casino, MD;  Location: [facility] CATH LAB;  Service: Cardiovascular;  Laterality: N/A;       Home Medications    Prior to Admission medications   Medication Sig Start Date End Date Taking? Authorizing Provider  aspirin 81 MG chewable tablet Chew 1 tablet (81 mg total) by mouth daily. 06/01/16   Elwin Mocha, MD  atorvastatin (LIPITOR) 80 MG tablet Take 1 tablet (80 mg total) by mouth daily. 01/06/16   Lelon Perla, MD  clopidogrel (PLAVIX) 75 MG tablet Take 75 mg by mouth daily.    Historical Provider, MD  famotidine (PEPCID) 20 MG tablet Take 20 mg  by mouth 2 (two) times daily.    Historical Provider, MD  furosemide (LASIX) 40 MG tablet Take 40 mg by mouth 2 (two) times daily.    Historical Provider, MD  gabapentin (NEURONTIN) 300 MG capsule Take 300 mg by mouth See admin instructions. Take one tablet by mouth in the morning and lunch, then take two at dinner and 1 tablet at bedtime per patient    Historical Provider, MD  glipiZIDE (GLUCOTROL) 10 MG tablet Take 1 tablet by mouth 2 (two) times daily. 11/13/15   Historical  Provider, MD  hydrALAZINE (APRESOLINE) 50 MG tablet Take 1 tablet (50 mg total) by mouth every 8 (eight) hours. Patient taking differently: Take 100 mg by mouth 2 (two) times daily.  05/29/14   Hosie Poisson, MD  HYDROcodone-acetaminophen (NORCO/VICODIN) 5-325 MG tablet Take 1-2 tablets by mouth every 4 (four) hours as needed for severe pain. 09/16/16   Duffy Bruce, MD  isosorbide dinitrate (ISORDIL) 30 MG tablet Take 30 mg by mouth at bedtime.    Historical Provider, MD  lisinopril (PRINIVIL,ZESTRIL) 40 MG tablet Take 40 mg by mouth daily.    Historical Provider, MD  metFORMIN (GLUCOPHAGE) 500 MG tablet Take 500 mg by mouth 2 (two) times daily with a meal.    Historical Provider, MD  metoprolol tartrate (LOPRESSOR) 50 MG tablet Take 1 tablet (50 mg total) by mouth 2 (two) times daily. 06/01/16   Elwin Mocha, MD  potassium chloride (MICRO-K) 10 MEQ CR capsule Take 3 capsules (30 mEq total) by mouth 2 (two) times daily. 07/03/16   Blanchie Dessert, MD  sertraline (ZOLOFT) 50 MG tablet Take 50 mg by mouth daily.    Historical Provider, MD  sitaGLIPtin (JANUVIA) 100 MG tablet Take 100 mg by mouth daily.    Historical Provider, MD  traZODone (DESYREL) 50 MG tablet Take 50 mg by mouth at bedtime.    Historical Provider, MD    Family History Family History  Problem Relation Age of Onset  . Cancer Father   . Hypertension Mother   . Diabetes Mother   . Hypertension Brother   . Hypertension Sister     Social History Social History  Substance Use Topics  . Smoking status: Never Smoker  . Smokeless tobacco: Never Used  . Alcohol use No     Allergies   Coconut oil; Aspirin; and Ibuprofen   Review of Systems Review of Systems  Constitutional: Negative for chills, fatigue and fever.  HENT: Negative for congestion and rhinorrhea.   Eyes: Negative for visual disturbance.  Respiratory: Negative for cough, shortness of breath and wheezing.   Cardiovascular: Negative for chest pain and leg  swelling.  Gastrointestinal: Negative for abdominal pain, diarrhea, nausea and vomiting.  Genitourinary: Negative for dysuria and flank pain.  Musculoskeletal: Positive for back pain and gait problem (Due to pain). Negative for neck pain and neck stiffness.  Skin: Negative for rash and wound.  Allergic/Immunologic: Negative for immunocompromised state.  Neurological: Negative for syncope, weakness and headaches.  All other systems reviewed and are negative.    Physical Exam Updated Vital Signs BP 127/83   Pulse 87   Temp 97.4 F (36.3 C) (Oral)   Resp 18   SpO2 100%   Physical Exam  Constitutional: He is oriented to person, place, and time. He appears well-developed and well-nourished. No distress.  HENT:  Head: Normocephalic and atraumatic.  Eyes: Conjunctivae are normal.  Neck: Neck supple.  Cardiovascular: Normal rate, regular rhythm and normal heart  sounds.  Exam reveals no friction rub.   No murmur heard. Pulmonary/Chest: Effort normal and breath sounds normal. No respiratory distress. He has no wheezes. He has no rales.  Abdominal: He exhibits no distension.  Musculoskeletal: He exhibits no edema.  Neurological: He is alert and oriented to person, place, and time. He exhibits normal muscle tone.  Skin: Skin is warm. Capillary refill takes less than 2 seconds.  Small, nontender, freely mobile lipomas versus sebaceous cyst and bilateral axilla  Psychiatric: He has a normal mood and affect.  Nursing note and vitals reviewed.   Spine Exam: Inspection/Palpation: Moderate TTP over right lateral lower paraspinal muscles along lumbosacral spine, with TTP over right medial buttocks. No midline TTP or deformity. Pain worsened with flexion of hip and internal/external rotation of flexed hip. Strength: 5/5 throughout LE bilaterally (hip flexion/extension, adduction/abduction; knee flexion/extension; foot dorsiflexion/plantarflexion, inversion/eversion; great toe  inversion) Sensation: Intact to light touch in proximal and distal LE bilaterally Reflexes: 2+ quadriceps and achilles reflexes   ED Treatments / Results  Labs (all labs ordered are listed, but only abnormal results are displayed) Labs Reviewed  CBG MONITORING, ED - Abnormal; Notable for the following:       Result Value   Glucose-Capillary 175 (*)    All other components within normal limits    EKG  EKG Interpretation None       Radiology Dg Hip Unilat  With Pelvis 2-3 Views Right  Result Date: 09/16/2016 CLINICAL DATA:  Pain EXAM: DG HIP (WITH OR WITHOUT PELVIS) 2-3V RIGHT COMPARISON:  September 02, 2016 FINDINGS: Frontal pelvis as well as frontal and lateral right hip images were obtained. There is no fracture or spondylolisthesis. There is moderate symmetric narrowing of both hip joints. No erosive change. There is a presumed bone island in the left superior iliac bone, stable. IMPRESSION: Moderate narrowing both hip joints, essentially stable. No fracture or dislocation. Presumed bone island left iliac crest. Electronically Signed   By: Lowella Grip III M.D.   On: 09/16/2016 08:14    Procedures Procedures (including critical care time)  Medications Ordered in ED Medications  HYDROmorphone (DILAUDID) injection 1 mg (1 mg Intramuscular Given 09/16/16 1108)  dexamethasone (DECADRON) injection 10 mg (10 mg Intramuscular Given 09/16/16 1107)     Initial Impression / Assessment and Plan / ED Course  I have reviewed the triage vital signs and the nursing notes.  Pertinent labs & imaging results that were available during my care of the patient were reviewed by me and considered in my medical decision making (see chart for details).  Clinical Course     54 year old male with past medical history as above here with atraumatic right buttocks pain radiating to his lower leg. Pain has been present for 24 hours only. On arrival, vital signs are stable. He has no red flag  symptoms. Suspect piriformis syndrome based on exam versus mild sciatica. Plain films of the hip show no acute fracture. There is no pelvic lesions as well. He has no midline tenderness of his back, no loss of bowel or bladder function do not suspect bony lumbar pathology or cauda equina. Will give IM analgesia here and discharge. Of note, patient cannot take NSAIDs due to history of GI bleeds. I also discussed risks versus benefits of steroids and patient would like to try them. Will give her a one-time dose of Decadron and I encouraged him to check his blood sugars regularly and follow-up with his doctor.  Final Clinical Impressions(s) /  ED Diagnoses   Final diagnoses:  Piriformis muscle pain  Sciatica of left side    New Prescriptions Discharge Medication List as of 09/16/2016 10:31 AM    START taking these medications   Details  HYDROcodone-acetaminophen (NORCO/VICODIN) 5-325 MG tablet Take 1-2 tablets by mouth every 4 (four) hours as needed for severe pain., Starting Fri 09/16/2016, Print         Duffy Bruce, MD 09/17/16 [phone]

## 2016-10-13 ENCOUNTER — Emergency Department (HOSPITAL_COMMUNITY)
Admission: EM | Admit: 2016-10-13 | Discharge: 2016-10-13 | Disposition: A | Discharge status: Home / Self Care | Payer: Medicaid Other | Attending: Emergency Medicine | Admitting: Emergency Medicine

## 2016-10-13 ENCOUNTER — Encounter (HOSPITAL_COMMUNITY): Payer: Self-pay | Admitting: Emergency Medicine

## 2016-10-13 DIAGNOSIS — Z7984 Long term (current) use of oral hypoglycemic drugs: Secondary | ICD-10-CM | POA: Insufficient documentation

## 2016-10-13 DIAGNOSIS — Z79899 Other long term (current) drug therapy: Secondary | ICD-10-CM | POA: Diagnosis not present

## 2016-10-13 DIAGNOSIS — M5431 Sciatica, right side: Secondary | ICD-10-CM | POA: Insufficient documentation

## 2016-10-13 DIAGNOSIS — I10 Essential (primary) hypertension: Secondary | ICD-10-CM | POA: Insufficient documentation

## 2016-10-13 DIAGNOSIS — E119 Type 2 diabetes mellitus without complications: Secondary | ICD-10-CM | POA: Diagnosis not present

## 2016-10-13 DIAGNOSIS — Z7982 Long term (current) use of aspirin: Secondary | ICD-10-CM | POA: Insufficient documentation

## 2016-10-13 DIAGNOSIS — R079 Chest pain, unspecified: Secondary | ICD-10-CM | POA: Diagnosis present

## 2016-10-13 DIAGNOSIS — I2510 Atherosclerotic heart disease of native coronary artery without angina pectoris: Secondary | ICD-10-CM | POA: Insufficient documentation

## 2016-10-13 MED ORDER — HYDROCODONE-ACETAMINOPHEN 5-325 MG PO TABS: 2.0000 | ORAL_TABLET | Freq: Two times a day (BID) | ORAL | 0 refills | Status: DC | PRN

## 2016-10-13 MED ORDER — HYDROMORPHONE HCL 2 MG/ML IJ SOLN
1.0000 mg | Freq: Once | INTRAMUSCULAR | Status: AC
  Administered 2016-10-13: 1 mg via INTRAMUSCULAR
  Filled 2016-10-13: qty 1

## 2016-10-13 MED ORDER — DEXAMETHASONE SODIUM PHOSPHATE 10 MG/ML IJ SOLN
10.0000 mg | Freq: Once | INTRAMUSCULAR | Status: AC
  Administered 2016-10-13: 10 mg via INTRAMUSCULAR
  Filled 2016-10-13: qty 1

## 2016-10-13 NOTE — ED Triage Notes (Signed)
Pt BIB EMS with complaint of sciatic pain this week. Today the back pain was worsening and the pt in an attempt to alleviate this was lying flat on his back on the floor.  After 2 hours of this he experienced sharp stabbing chest pain.  EMS gave 4 baby aspirin en route.  No nitro given. Chest pain completely resolved en route and pt has no complaints of chest pain at this time. No shortness of breath. Lung sounds CTA.

## 2016-10-13 NOTE — ED Provider Notes (Signed)
[facility] DEPT Provider Note   CSN: [id number] Arrival date & time: 10/13/16  [id number]     History   Chief Complaint Chief Complaint  Patient presents with  . Back Pain  . Chest Pain    HPI [name] is a 55 y.o. male with a past medical history of back pain and sciatica presenting today with the same. Patient states 2 days ago he twisted his back and the pain started. It radiates all the way down his right lower extremity to his toes. He states this feels like his normal sciatica that flares up on him. He did not take any pain medication at home because he does not have any. There is documentation of chest pain however he states that this was mild and started today and went away. He states is unrelated to his back pain. He states that he is here for his back pain. There are no further complaints. He denies any shortness of breath, vomiting or diaphoresis. He has no exertional component to the chest pain.  10 Systems reviewed and are negative for acute change except as noted in the HPI.    HPI  Past Medical History:  Diagnosis Date  . Back pain   . CAD (coronary artery disease)    Nonobstructive  . Hyperlipidemia   . Hypertension   . IBS (irritable bowel syndrome)   . OSA (obstructive sleep apnea)   . Pancreatitis   . Peptic ulcer   . PUD (peptic ulcer disease)   . Type 2 diabetes mellitus [facility])     Patient Active Problem List   Diagnosis Date Noted  . Type 2 diabetes mellitus with vascular disease ([place]) 05/31/2016  . Normocytic normochromic anemia 05/31/2016  . Chest pain 05/31/2016  . CAD (coronary artery disease) 01/06/2016  . DVT (deep venous thrombosis) ([place]) 01/06/2016  . Lactic acid acidosis 05/28/2014  . Abdominal pain 05/28/2014  . Nonspecific chest pain 01/29/2014  . Uncontrolled secondary diabetes with peripheral neuropathy ([place]) 01/29/2014  . Morbid obesity ([place]) 01/29/2014  . Snoring 01/29/2014  . Dyslipidemia 01/29/2014  . HTN (hypertension)  01/29/2014  . Abnormal nuclear stress test 01/29/2014    Past Surgical History:  Procedure Laterality Date  . CARDIAC CATHETERIZATION N/A 05/31/2016   Procedure: Left Heart Cath and Coronary Angiography;  Surgeon: Peter M Martinique, MD;  Location: [place] CV LAB;  Service: Cardiovascular;  Laterality: N/A;  . CARDIAC CATHETERIZATION N/A 05/31/2016   Procedure: Intravascular Pressure Wire/FFR Study;  Surgeon: Peter M Martinique, MD;  Location: [place] CV LAB;  Service: Cardiovascular;  Laterality: N/A;  . CARDIAC CATHETERIZATION N/A 05/31/2016   Procedure: Coronary Stent Intervention;  Surgeon: Peter M Martinique, MD;  Location: [place] CV LAB;  Service: Cardiovascular;  Laterality: N/A;  . LEFT HEART CATHETERIZATION WITH CORONARY ANGIOGRAM N/A 02/03/2014   Procedure: LEFT HEART CATHETERIZATION WITH CORONARY ANGIOGRAM;  Surgeon: Pixie Casino, MD;  Location: [facility] CATH LAB;  Service: Cardiovascular;  Laterality: N/A;       Home Medications    Prior to Admission medications   Medication Sig Start Date End Date Taking? Authorizing Provider  aspirin 81 MG chewable tablet Chew 1 tablet (81 mg total) by mouth daily. 06/01/16   Elwin Mocha, MD  atorvastatin (LIPITOR) 80 MG tablet Take 1 tablet (80 mg total) by mouth daily. 01/06/16   Lelon Perla, MD  clopidogrel (PLAVIX) 75 MG tablet Take 75 mg by mouth daily.    Historical Provider, MD  famotidine (  PEPCID) 20 MG tablet Take 20 mg by mouth 2 (two) times daily.    Historical Provider, MD  furosemide (LASIX) 40 MG tablet Take 40 mg by mouth 2 (two) times daily.    Historical Provider, MD  gabapentin (NEURONTIN) 300 MG capsule Take 300 mg by mouth See admin instructions. Take one tablet by mouth in the morning and lunch, then take two at dinner and 1 tablet at bedtime per patient    Historical Provider, MD  glipiZIDE (GLUCOTROL) 10 MG tablet Take 1 tablet by mouth 2 (two) times daily. 11/13/15   Historical Provider, MD  hydrALAZINE (APRESOLINE) 50  MG tablet Take 1 tablet (50 mg total) by mouth every 8 (eight) hours. Patient taking differently: Take 100 mg by mouth 2 (two) times daily.  05/29/14   Hosie Poisson, MD  HYDROcodone-acetaminophen (NORCO/VICODIN) 5-325 MG tablet Take 2 tablets by mouth 2 (two) times daily as needed. 10/13/16   Everlene Balls, MD  isosorbide dinitrate (ISORDIL) 30 MG tablet Take 30 mg by mouth at bedtime.    Historical Provider, MD  lisinopril (PRINIVIL,ZESTRIL) 40 MG tablet Take 40 mg by mouth daily.    Historical Provider, MD  metFORMIN (GLUCOPHAGE) 500 MG tablet Take 500 mg by mouth 2 (two) times daily with a meal.    Historical Provider, MD  metoprolol tartrate (LOPRESSOR) 50 MG tablet Take 1 tablet (50 mg total) by mouth 2 (two) times daily. 06/01/16   Elwin Mocha, MD  potassium chloride (MICRO-K) 10 MEQ CR capsule Take 3 capsules (30 mEq total) by mouth 2 (two) times daily. 07/03/16   Blanchie Dessert, MD  sertraline (ZOLOFT) 50 MG tablet Take 50 mg by mouth daily.    Historical Provider, MD  sitaGLIPtin (JANUVIA) 100 MG tablet Take 100 mg by mouth daily.    Historical Provider, MD  traZODone (DESYREL) 50 MG tablet Take 50 mg by mouth at bedtime.    Historical Provider, MD    Family History Family History  Problem Relation Age of Onset  . Cancer Father   . Hypertension Mother   . Diabetes Mother   . Hypertension Brother   . Hypertension Sister     Social History Social History  Substance Use Topics  . Smoking status: Never Smoker  . Smokeless tobacco: Never Used  . Alcohol use No     Allergies   Coconut oil; Aspirin; and Ibuprofen   Review of Systems Review of Systems   Physical Exam Updated Vital Signs BP 142/74 (BP Location: Right Arm)   Pulse 70   Resp 18   SpO2 98%   Physical Exam  Constitutional: He is oriented to person, place, and time. Vital signs are normal. He appears well-developed and well-nourished.  Non-toxic appearance. He does not appear ill. No distress.  HENT:    Head: Normocephalic and atraumatic.  Nose: Nose normal.  Mouth/Throat: Oropharynx is clear and moist. No oropharyngeal exudate.  Eyes: Conjunctivae and EOM are normal. Pupils are equal, round, and reactive to light. No scleral icterus.  Neck: Normal range of motion. Neck supple. No tracheal deviation, no edema, no erythema and normal range of motion present. No thyroid mass and no thyromegaly present.  Cardiovascular: Normal rate, regular rhythm, S1 normal, S2 normal, normal heart sounds, intact distal pulses and normal pulses.  Exam reveals no gallop and no friction rub.   No murmur heard. Pulmonary/Chest: Effort normal and breath sounds normal. No respiratory distress. He has no wheezes. He has no rhonchi. He has no  rales.  Abdominal: Soft. Normal appearance and bowel sounds are normal. He exhibits no distension, no ascites and no mass. There is no hepatosplenomegaly. There is no tenderness. There is no rebound, no guarding and no CVA tenderness.  Musculoskeletal: Normal range of motion. He exhibits no edema or tenderness.  Lymphadenopathy:    He has no cervical adenopathy.  Neurological: He is alert and oriented to person, place, and time. He has normal strength. No cranial nerve deficit or sensory deficit.  Normal strength and sensation in all extremities. Normal cerebellar testing.  Skin: Skin is warm, dry and intact. No petechiae and no rash noted. He is not diaphoretic. No erythema. No pallor.  Nursing note and vitals reviewed.    ED Treatments / Results  Labs (all labs ordered are listed, but only abnormal results are displayed) Labs Reviewed - No data to display  EKG  EKG Interpretation None       Radiology No results found.  Procedures Procedures (including critical care time)  Medications Ordered in ED Medications  HYDROmorphone (DILAUDID) injection 1 mg (1 mg Intramuscular Given 10/13/16 0641)  dexamethasone (DECADRON) injection 10 mg (10 mg Intramuscular Given  10/13/16 0641)     Initial Impression / Assessment and Plan / ED Course  I have reviewed the triage vital signs and the nursing notes.  Pertinent labs & imaging results that were available during my care of the patient were reviewed by me and considered in my medical decision making (see chart for details).  Clinical Course     Patient presents emergency department for back pain. His neurological exam is completely normal. Likely sciatica given his history. He was given Dilaudid and Decadron as he states that works for him last time. We'll discharge home with Norco for severe pain. He artery has follow-up, he was advised to repeat an MRI for further evaluation at his outpatient follow-up. She demonstrates good understanding. He appears well in no acute distress, vital signs were within his normal limits and he is safe for discharge.  Final Clinical Impressions(s) / ED Diagnoses   Final diagnoses:  Sciatica of right side    New Prescriptions New Prescriptions   HYDROCODONE-ACETAMINOPHEN (NORCO/VICODIN) 5-325 MG TABLET    Take 2 tablets by mouth 2 (two) times daily as needed.     Everlene Balls, MD 10/13/16 [phone]

## 2016-10-21 ENCOUNTER — Emergency Department (HOSPITAL_COMMUNITY)
Admission: EM | Admit: 2016-10-21 | Discharge: 2016-10-21 | Disposition: A | Discharge status: Home / Self Care | Payer: Medicaid Other | Attending: Emergency Medicine | Admitting: Emergency Medicine

## 2016-10-21 ENCOUNTER — Emergency Department (HOSPITAL_COMMUNITY): Payer: Medicaid Other

## 2016-10-21 DIAGNOSIS — E1140 Type 2 diabetes mellitus with diabetic neuropathy, unspecified: Secondary | ICD-10-CM | POA: Insufficient documentation

## 2016-10-21 DIAGNOSIS — R1084 Generalized abdominal pain: Secondary | ICD-10-CM | POA: Insufficient documentation

## 2016-10-21 DIAGNOSIS — I2510 Atherosclerotic heart disease of native coronary artery without angina pectoris: Secondary | ICD-10-CM | POA: Diagnosis not present

## 2016-10-21 DIAGNOSIS — Z7982 Long term (current) use of aspirin: Secondary | ICD-10-CM | POA: Insufficient documentation

## 2016-10-21 DIAGNOSIS — Z7984 Long term (current) use of oral hypoglycemic drugs: Secondary | ICD-10-CM | POA: Insufficient documentation

## 2016-10-21 DIAGNOSIS — M5441 Lumbago with sciatica, right side: Secondary | ICD-10-CM | POA: Insufficient documentation

## 2016-10-21 DIAGNOSIS — I10 Essential (primary) hypertension: Secondary | ICD-10-CM | POA: Insufficient documentation

## 2016-10-21 DIAGNOSIS — R1031 Right lower quadrant pain: Secondary | ICD-10-CM | POA: Diagnosis present

## 2016-10-21 DIAGNOSIS — M5431 Sciatica, right side: Secondary | ICD-10-CM

## 2016-10-21 DIAGNOSIS — R109 Unspecified abdominal pain: Secondary | ICD-10-CM

## 2016-10-21 LAB — URINALYSIS, ROUTINE W REFLEX MICROSCOPIC
Bilirubin Urine: NEGATIVE
Glucose, UA: NEGATIVE mg/dL
Hgb urine dipstick: NEGATIVE
Ketones, ur: 5 mg/dL — AB
Leukocytes, UA: NEGATIVE
Nitrite: NEGATIVE
Protein, ur: NEGATIVE mg/dL
Specific Gravity, Urine: 1.02 (ref 1.005–1.030)
pH: 5 (ref 5.0–8.0)

## 2016-10-21 LAB — COMPREHENSIVE METABOLIC PANEL
ALT: 21 U/L (ref 17–63)
AST: 21 U/L (ref 15–41)
Albumin: 3.8 g/dL (ref 3.5–5.0)
Alkaline Phosphatase: 42 U/L (ref 38–126)
Anion gap: 12 (ref 5–15)
BUN: 15 mg/dL (ref 6–20)
CO2: 26 mmol/L (ref 22–32)
Calcium: 8.6 mg/dL — ABNORMAL LOW (ref 8.9–10.3)
Chloride: 97 mmol/L — ABNORMAL LOW (ref 101–111)
Creatinine, Ser: 0.63 mg/dL (ref 0.61–1.24)
GFR calc Af Amer: >60 mL/min (ref >60)
GFR calc non Af Amer: >60 mL/min (ref >60)
Glucose, Bld: 127 mg/dL — ABNORMAL HIGH (ref 65–99)
Potassium: 3 mmol/L — ABNORMAL LOW (ref 3.5–5.1)
Sodium: 135 mmol/L (ref 135–145)
Total Bilirubin: 0.7 mg/dL (ref 0.3–1.2)
Total Protein: 6.8 g/dL (ref 6.5–8.1)

## 2016-10-21 LAB — CBC WITH DIFFERENTIAL/PLATELET
Basophils Absolute: 0 10*3/uL (ref 0.0–0.1)
Basophils Relative: 0 %
Eosinophils Absolute: 0.1 10*3/uL (ref 0.0–0.7)
Eosinophils Relative: 1 %
HCT: 36.8 % — ABNORMAL LOW (ref 39.0–52.0)
Hemoglobin: 12.8 g/dL — ABNORMAL LOW (ref 13.0–17.0)
Lymphocytes Relative: 24 %
Lymphs Abs: 3.1 10*3/uL (ref 0.7–4.0)
MCH: 30.9 pg (ref 26.0–34.0)
MCHC: 34.8 g/dL (ref 30.0–36.0)
MCV: 88.9 fL (ref 78.0–100.0)
Monocytes Absolute: 0.7 10*3/uL (ref 0.1–1.0)
Monocytes Relative: 6 %
Neutro Abs: 8.8 10*3/uL — ABNORMAL HIGH (ref 1.7–7.7)
Neutrophils Relative %: 69 %
Platelets: 247 10*3/uL (ref 150–400)
RBC: 4.14 MIL/uL — ABNORMAL LOW (ref 4.22–5.81)
RDW: 13.8 % (ref 11.5–15.5)
WBC: 12.8 10*3/uL — ABNORMAL HIGH (ref 4.0–10.5)

## 2016-10-21 MED ORDER — HYDROMORPHONE HCL 2 MG/ML IJ SOLN
1.0000 mg | Freq: Once | INTRAMUSCULAR | Status: DC
  Filled 2016-10-21: qty 1

## 2016-10-21 MED ORDER — POTASSIUM CHLORIDE CRYS ER 20 MEQ PO TBCR
40.0000 meq | EXTENDED_RELEASE_TABLET | Freq: Once | ORAL | Status: AC
  Administered 2016-10-21: 40 meq via ORAL
  Filled 2016-10-21: qty 2

## 2016-10-21 MED ORDER — HYDROMORPHONE HCL 2 MG/ML IJ SOLN
1.0000 mg | Freq: Once | INTRAMUSCULAR | Status: AC
  Administered 2016-10-21: 1 mg via INTRAMUSCULAR
  Filled 2016-10-21: qty 1

## 2016-10-21 MED ORDER — HYDROMORPHONE HCL 2 MG/ML IJ SOLN
1.0000 mg | Freq: Once | INTRAMUSCULAR | Status: AC
  Administered 2016-10-21: 1 mg via INTRAMUSCULAR

## 2016-10-21 NOTE — ED Notes (Signed)
Patient aware that a urine sample is needed. Urinal is at the bedside.  

## 2016-10-21 NOTE — ED Notes (Signed)
Confirmed with MD to administer 2nd dose of dilaudid.

## 2016-10-21 NOTE — ED Triage Notes (Addendum)
Per EMS, pt has complaints of right flank pain for last 5 hours that is sharp and 8/10 and painful to palpation. Pt has been told in the past that he has sciatica pain. Per EMS pt denies n/v/d.

## 2016-10-21 NOTE — ED Provider Notes (Signed)
[facility] DEPT Provider Note   CSN: [id number] Arrival date & time: 10/21/16  1413     History   Chief Complaint Chief Complaint  Patient presents with  . Flank Pain    HPI [name] is a 55 y.o. male.  The history is provided by the patient. No language interpreter was used.  Flank Pain    [name] is a 55 y.o. male who presents to the Emergency Department complaining of Flank pain. He has a history of sciatica and has had increased pain in his right low back radiating to his right thigh and knee. Today he developed right lower quadrant abdominal pain that is sharp and burning in nature. It is similar to sciatica pain but is still different location than typical. Pain is constant in nature. He denies any fevers, chest pain, vomiting. He does have some associated nausea and diarrhea. He denies any bloody stools. He has a history of ongoing urinary frequency-unchanged from baseline, no change in urine quantity. He denies any recent injuries. He is followed by pain management in [facility] and has appointment next week. He has chronic abdominal pain, unchanged from prior. Past Medical History:  Diagnosis Date  . Back pain   . CAD (coronary artery disease)    Nonobstructive  . Hyperlipidemia   . Hypertension   . IBS (irritable bowel syndrome)   . OSA (obstructive sleep apnea)   . Pancreatitis   . Peptic ulcer   . PUD (peptic ulcer disease)   . Type 2 diabetes mellitus [facility])     Patient Active Problem List   Diagnosis Date Noted  . Type 2 diabetes mellitus with vascular disease ([place]) 05/31/2016  . Normocytic normochromic anemia 05/31/2016  . Chest pain 05/31/2016  . CAD (coronary artery disease) 01/06/2016  . DVT (deep venous thrombosis) ([place]) 01/06/2016  . Lactic acid acidosis 05/28/2014  . Abdominal pain 05/28/2014  . Nonspecific chest pain 01/29/2014  . Uncontrolled secondary diabetes with peripheral neuropathy ([place]) 01/29/2014  . Morbid obesity  ([place]) 01/29/2014  . Snoring 01/29/2014  . Dyslipidemia 01/29/2014  . HTN (hypertension) 01/29/2014  . Abnormal nuclear stress test 01/29/2014    Past Surgical History:  Procedure Laterality Date  . CARDIAC CATHETERIZATION N/A 05/31/2016   Procedure: Left Heart Cath and Coronary Angiography;  Surgeon: Peter M Martinique, MD;  Location: [place] CV LAB;  Service: Cardiovascular;  Laterality: N/A;  . CARDIAC CATHETERIZATION N/A 05/31/2016   Procedure: Intravascular Pressure Wire/FFR Study;  Surgeon: Peter M Martinique, MD;  Location: [place] CV LAB;  Service: Cardiovascular;  Laterality: N/A;  . CARDIAC CATHETERIZATION N/A 05/31/2016   Procedure: Coronary Stent Intervention;  Surgeon: Peter M Martinique, MD;  Location: [place] CV LAB;  Service: Cardiovascular;  Laterality: N/A;  . LEFT HEART CATHETERIZATION WITH CORONARY ANGIOGRAM N/A 02/03/2014   Procedure: LEFT HEART CATHETERIZATION WITH CORONARY ANGIOGRAM;  Surgeon: Pixie Casino, MD;  Location: [facility] CATH LAB;  Service: Cardiovascular;  Laterality: N/A;       Home Medications    Prior to Admission medications   Medication Sig Start Date End Date Taking? Authorizing Provider  amLODipine (NORVASC) 10 MG tablet Take 10 mg by mouth daily.   Yes Historical Provider, MD  aspirin 81 MG chewable tablet Chew 1 tablet (81 mg total) by mouth daily. 06/01/16  Yes Elwin Mocha, MD  atorvastatin (LIPITOR) 80 MG tablet Take 1 tablet (80 mg total) by mouth daily. 01/06/16  Yes Lelon Perla, MD  clopidogrel (PLAVIX) 75 MG tablet Take 75 mg by mouth daily.   Yes Historical Provider, MD  famotidine (PEPCID) 20 MG tablet Take 20 mg by mouth 2 (two) times daily.   Yes Historical Provider, MD  furosemide (LASIX) 40 MG tablet Take 40 mg by mouth 2 (two) times daily.   Yes Historical Provider, MD  gabapentin (NEURONTIN) 300 MG capsule Take 300 mg by mouth 2 (two) times daily. Take one tablet by mouth in the morning and lunch, then take two at dinner and 1  tablet at bedtime per patient    Yes Historical Provider, MD  glipiZIDE (GLUCOTROL) 10 MG tablet Take 1 tablet by mouth 2 (two) times daily. 11/13/15  Yes Historical Provider, MD  hydrALAZINE (APRESOLINE) 50 MG tablet Take 1 tablet (50 mg total) by mouth every 8 (eight) hours. Patient taking differently: Take 50 mg by mouth 2 (two) times daily.  05/29/14  Yes Hosie Poisson, MD  isosorbide dinitrate (ISORDIL) 30 MG tablet Take 30 mg by mouth at bedtime.   Yes Historical Provider, MD  lisinopril (PRINIVIL,ZESTRIL) 40 MG tablet Take 40 mg by mouth daily.   Yes Historical Provider, MD  metFORMIN (GLUCOPHAGE) 1000 MG tablet Take 1,000 mg by mouth 2 (two) times daily with a meal.   Yes Historical Provider, MD  methocarbamol (ROBAXIN) 750 MG tablet Take 750 mg by mouth 3 (three) times daily as needed for muscle spasms.  10/12/16  Yes Historical Provider, MD  metoprolol tartrate (LOPRESSOR) 50 MG tablet Take 1 tablet (50 mg total) by mouth 2 (two) times daily. 06/01/16  Yes Elwin Mocha, MD  pantoprazole (PROTONIX) 20 MG tablet Take 20 mg by mouth daily.  10/04/16  Yes Historical Provider, MD  potassium chloride SA (K-DUR,KLOR-CON) 20 MEQ tablet Take 20 mEq by mouth daily.   Yes Historical Provider, MD  sertraline (ZOLOFT) 50 MG tablet Take 50 mg by mouth daily.   Yes Historical Provider, MD  sitaGLIPtin (JANUVIA) 100 MG tablet Take 100 mg by mouth daily.   Yes Historical Provider, MD  traMADol (ULTRAM) 50 MG tablet Take 1 tablet by mouth twice daily AS NEEDED PAIN 10/04/16  Yes Historical Provider, MD  traZODone (DESYREL) 50 MG tablet Take 50 mg by mouth at bedtime.   Yes Historical Provider, MD  HYDROcodone-acetaminophen (NORCO/VICODIN) 5-325 MG tablet Take 2 tablets by mouth 2 (two) times daily as needed. Patient not taking: Reported on 10/21/2016 10/13/16   Everlene Balls, MD  potassium chloride (MICRO-K) 10 MEQ CR capsule Take 3 capsules (30 mEq total) by mouth 2 (two) times daily. Patient not taking:  Reported on 10/21/2016 07/03/16   Blanchie Dessert, MD    Family History Family History  Problem Relation Age of Onset  . Cancer Father   . Hypertension Mother   . Diabetes Mother   . Hypertension Brother   . Hypertension Sister     Social History Social History  Substance Use Topics  . Smoking status: Never Smoker  . Smokeless tobacco: Never Used  . Alcohol use No     Allergies   Coconut oil; Aspirin; and Ibuprofen   Review of Systems Review of Systems  Genitourinary: Positive for flank pain.  All other systems reviewed and are negative.    Physical Exam Updated Vital Signs BP 126/85 (BP Location: Left Arm)   Pulse 75   Temp 98.1 F (36.7 C) (Oral)   Resp 16   SpO2 95%   Physical Exam  Constitutional: He is oriented to person,  place, and time. He appears well-developed and well-nourished.  HENT:  Head: Normocephalic and atraumatic.  Cardiovascular: Normal rate and regular rhythm.   No murmur heard. Pulmonary/Chest: Effort normal and breath sounds normal. No respiratory distress.  Abdominal: Soft. There is no rebound and no guarding.  Mild diffuse abdominal tenderness, greatest over the right lower quadrant and right inguinal canal  Musculoskeletal: He exhibits no edema or tenderness.  Mild tenderness to palpation over the lower lumbar spine and the right paraspinous muscles.  Neurological: He is alert and oriented to person, place, and time.  5 out of 5 strength in bilateral lower extremities with sensation to light touch intact in bilateral lower extremities  Skin: Skin is warm and dry. No rash noted.  Psychiatric: He has a normal mood and affect. His behavior is normal.  Nursing note and vitals reviewed.    ED Treatments / Results  Labs (all labs ordered are listed, but only abnormal results are displayed) Labs Reviewed  URINALYSIS, ROUTINE W REFLEX MICROSCOPIC - Abnormal; Notable for the following:       Result Value   Ketones, ur 5 (*)    All  other components within normal limits  COMPREHENSIVE METABOLIC PANEL - Abnormal; Notable for the following:    Potassium 3.0 (*)    Chloride 97 (*)    Glucose, Bld 127 (*)    Calcium 8.6 (*)    All other components within normal limits  CBC WITH DIFFERENTIAL/PLATELET - Abnormal; Notable for the following:    WBC 12.8 (*)    RBC 4.14 (*)    Hemoglobin 12.8 (*)    HCT 36.8 (*)    Neutro Abs 8.8 (*)    All other components within normal limits    EKG  EKG Interpretation None       Radiology Ct Renal Stone Study  Result Date: 10/21/2016 CLINICAL DATA:  RIGHT flank pain for 3-4 months. For seen today. History of pancreatitis and peptic ulcer disease. EXAM: CT ABDOMEN AND PELVIS WITHOUT CONTRAST TECHNIQUE: Multidetector CT imaging of the abdomen and pelvis was performed following the standard protocol without IV contrast. COMPARISON:  CT 03/30/2016 FINDINGS: Lower chest: Lung bases are clear. Hepatobiliary: Low-attenuation of the liver parenchyma diffusely suggests hepatic steatosis. No duct dilatation. Normal gallbladder. Normal common bile duct. Pancreas: No pancreatic inflammation. No duct dilatation. No fluid collections. No significant calcifications. Spleen: Caps Adrenals/urinary tract: Adrenal glands and kidneys are normal. The ureters and bladder normal. Stomach/Bowel: Stomach, small bowel, appendix, and cecum are normal. The colon and rectosigmoid colon are normal. Vascular/Lymphatic: Abdominal aorta is normal caliber. There is no retroperitoneal or periportal lymphadenopathy. No pelvic lymphadenopathy. Reproductive: Prostate normal. Other: No free fluid. Musculoskeletal: No aggressive osseous lesion. Degenerative osteophytosis of the spine. IMPRESSION: 1. No acute findings the abdomen pelvis. 2. Hepatic steatosis. 3. No nephrolithiasis or ureterolithiasis. Electronically Signed   By: Suzy Bouchard M.D.   On: 10/21/2016 15:44    Procedures Procedures (including critical care  time)  Medications Ordered in ED Medications  HYDROmorphone (DILAUDID) injection 1 mg (1 mg Intramuscular Given 10/21/16 1654)  potassium chloride SA (K-DUR,KLOR-CON) CR tablet 40 mEq (40 mEq Oral Given 10/21/16 1707)  HYDROmorphone (DILAUDID) injection 1 mg (1 mg Intramuscular Given 10/21/16 1812)     Initial Impression / Assessment and Plan / ED Course  I have reviewed the triage vital signs and the nursing notes.  Pertinent labs & imaging results that were available during my care of the patient were reviewed by  me and considered in my medical decision making (see chart for details).  Clinical Course   Patient with history of sciatica here with right flank and right lower quadrant/inguinal pain. Pain also radiates down to his knee. He is neurovascularly intact on examination. No evidence of renal stone or appendicitis on imaging. CBC with mild leukocytosis, thought to be secondary to recent Decadron administration. Discussed home care for sciatica with pain control, rest, alternating heat and ice. Discussed importance of PCP follow-up as well as return precautions.  Final Clinical Impressions(s) / ED Diagnoses   Final diagnoses:  Right flank pain  Sciatica of right side    New Prescriptions Discharge Medication List as of 10/21/2016  6:05 PM       Quintella Reichert, MD 10/22/16 [phone]

## 2016-10-21 NOTE — ED Notes (Signed)
Bed: [id number] Expected date:  Expected time:  Means of arrival:  Comments: 55 yo M flank pain

## 2016-10-21 NOTE — ED Notes (Signed)
Patient aware of the need for urine.  NT gave patient urinal and patient will call after he has sample.

## 2016-10-21 NOTE — ED Notes (Signed)
Patient d/c'd self care.  F/U reviewed.  Patient verbalized understanding. 

## 2016-10-28 ENCOUNTER — Encounter (HOSPITAL_COMMUNITY): Payer: Self-pay | Admitting: Emergency Medicine

## 2016-10-28 DIAGNOSIS — E119 Type 2 diabetes mellitus without complications: Secondary | ICD-10-CM | POA: Diagnosis not present

## 2016-10-28 DIAGNOSIS — R112 Nausea with vomiting, unspecified: Secondary | ICD-10-CM | POA: Diagnosis not present

## 2016-10-28 DIAGNOSIS — Z7984 Long term (current) use of oral hypoglycemic drugs: Secondary | ICD-10-CM | POA: Diagnosis not present

## 2016-10-28 DIAGNOSIS — Z7982 Long term (current) use of aspirin: Secondary | ICD-10-CM | POA: Diagnosis not present

## 2016-10-28 DIAGNOSIS — I10 Essential (primary) hypertension: Secondary | ICD-10-CM | POA: Diagnosis not present

## 2016-10-28 DIAGNOSIS — Z79899 Other long term (current) drug therapy: Secondary | ICD-10-CM | POA: Insufficient documentation

## 2016-10-28 DIAGNOSIS — Z7902 Long term (current) use of antithrombotics/antiplatelets: Secondary | ICD-10-CM | POA: Insufficient documentation

## 2016-10-28 DIAGNOSIS — M25551 Pain in right hip: Secondary | ICD-10-CM | POA: Insufficient documentation

## 2016-10-28 DIAGNOSIS — I2510 Atherosclerotic heart disease of native coronary artery without angina pectoris: Secondary | ICD-10-CM | POA: Diagnosis not present

## 2016-10-28 LAB — COMPREHENSIVE METABOLIC PANEL
ALT: 30 U/L (ref 17–63)
AST: 32 U/L (ref 15–41)
Albumin: 3.7 g/dL (ref 3.5–5.0)
Alkaline Phosphatase: 46 U/L (ref 38–126)
Anion gap: 14 (ref 5–15)
BUN: 9 mg/dL (ref 6–20)
CO2: 25 mmol/L (ref 22–32)
Calcium: 9.4 mg/dL (ref 8.9–10.3)
Chloride: 99 mmol/L — ABNORMAL LOW (ref 101–111)
Creatinine, Ser: 0.78 mg/dL (ref 0.61–1.24)
GFR calc Af Amer: >60 mL/min (ref >60)
GFR calc non Af Amer: >60 mL/min (ref >60)
Glucose, Bld: 175 mg/dL — ABNORMAL HIGH (ref 65–99)
Potassium: 3.5 mmol/L (ref 3.5–5.1)
Sodium: 138 mmol/L (ref 135–145)
Total Bilirubin: 1.1 mg/dL (ref 0.3–1.2)
Total Protein: 7 g/dL (ref 6.5–8.1)

## 2016-10-28 LAB — URINALYSIS, ROUTINE W REFLEX MICROSCOPIC
Bacteria, UA: NONE SEEN
Bilirubin Urine: NEGATIVE
Glucose, UA: >=500 mg/dL — AB
Hgb urine dipstick: NEGATIVE
Ketones, ur: 5 mg/dL — AB
Leukocytes, UA: NEGATIVE
Nitrite: NEGATIVE
Protein, ur: NEGATIVE mg/dL
Specific Gravity, Urine: 1.025 (ref 1.005–1.030)
Squamous Epithelial / LPF: NONE SEEN
WBC, UA: NONE SEEN WBC/hpf (ref 0–5)
pH: 5 (ref 5.0–8.0)

## 2016-10-28 LAB — LIPASE, BLOOD: Lipase: 23 U/L (ref 11–51)

## 2016-10-28 LAB — CBC
HCT: 39 % (ref 39.0–52.0)
Hemoglobin: 12.9 g/dL — ABNORMAL LOW (ref 13.0–17.0)
MCH: 30.4 pg (ref 26.0–34.0)
MCHC: 33.1 g/dL (ref 30.0–36.0)
MCV: 92 fL (ref 78.0–100.0)
Platelets: 198 10*3/uL (ref 150–400)
RBC: 4.24 MIL/uL (ref 4.22–5.81)
RDW: 14.1 % (ref 11.5–15.5)
WBC: 12.7 10*3/uL — ABNORMAL HIGH (ref 4.0–10.5)

## 2016-10-28 MED ORDER — OXYCODONE-ACETAMINOPHEN 5-325 MG PO TABS
1.0000 | ORAL_TABLET | ORAL | Status: DC | PRN
  Administered 2016-10-28: 1 via ORAL

## 2016-10-28 MED ORDER — ONDANSETRON 4 MG PO TBDP
4.0000 mg | ORAL_TABLET | Freq: Once | ORAL | Status: AC | PRN
  Administered 2016-10-28: 4 mg via ORAL

## 2016-10-28 MED ORDER — OXYCODONE-ACETAMINOPHEN 5-325 MG PO TABS
ORAL_TABLET | ORAL | Status: AC
  Filled 2016-10-28: qty 1

## 2016-10-28 NOTE — ED Notes (Signed)
Pt stated Pain medicine is wearing off and is requesting more pain medicine.

## 2016-10-28 NOTE — ED Triage Notes (Signed)
Patient felt pop while walking in right hip last night. States he felt it "go out on him." He has had 2 episodes of vomiting today. Hx HTN, diabetes. Hx of arthritis. Patient c/o pain on shoulder as well. 162/98, 78 HR, resp 16. Patient alert and oriented x 4.

## 2016-10-29 ENCOUNTER — Emergency Department (HOSPITAL_COMMUNITY)
Admission: EM | Admit: 2016-10-29 | Discharge: 2016-10-29 | Disposition: A | Discharge status: Home / Self Care | Payer: Medicaid Other | Attending: Emergency Medicine | Admitting: Emergency Medicine

## 2016-10-29 DIAGNOSIS — R112 Nausea with vomiting, unspecified: Secondary | ICD-10-CM

## 2016-10-29 DIAGNOSIS — M25551 Pain in right hip: Secondary | ICD-10-CM

## 2016-10-29 MED ORDER — OXYCODONE-ACETAMINOPHEN 5-325 MG PO TABS: 1.0000 | ORAL_TABLET | Freq: Four times a day (QID) | ORAL | 0 refills | Status: DC | PRN

## 2016-10-29 MED ORDER — OXYCODONE-ACETAMINOPHEN 5-325 MG PO TABS
1.0000 | ORAL_TABLET | Freq: Once | ORAL | Status: AC
  Administered 2016-10-29: 1 via ORAL
  Filled 2016-10-29: qty 1

## 2016-10-29 MED ORDER — ONDANSETRON 4 MG PO TBDP: 4.0000 mg | ORAL_TABLET | Freq: Three times a day (TID) | ORAL | 0 refills | Status: DC | PRN

## 2016-10-29 MED ORDER — ONDANSETRON 4 MG PO TBDP
4.0000 mg | ORAL_TABLET | Freq: Once | ORAL | Status: AC
  Administered 2016-10-29: 4 mg via ORAL
  Filled 2016-10-29: qty 1

## 2016-10-29 NOTE — ED Notes (Signed)
Pt verbalized understanding of d/c instructions. Pt states he will wait in the lobby until bus starts running in the AM

## 2016-10-29 NOTE — Discharge Instructions (Signed)
Return to the ED with any concerns including vomiting and not able to keep down liquids or your medications, abdominal pain especially if it localizes to the right lower abdomen, fever or chills, and decreased urine output, weakness of legs, not able to urinate, loss of control of urine or bowels, decreased level of alertness or lethargy, or any other alarming symptoms.

## 2016-10-29 NOTE — ED Provider Notes (Signed)
[facility] DEPT Provider Note   CSN: [id number] Arrival date & time: 10/28/16  1723  By signing my name below, I, Gwenlyn Fudge, attest that this documentation has been prepared under the direction and in the presence of Alfonzo Beers, MD. Electronically Signed: Gwenlyn Fudge, ED Scribe. 10/29/16. 5:24 PM.   History   Chief Complaint Chief Complaint  Patient presents with  . Nausea  . Hip Pain   Pt has PMHx of DM, Arthritis, HLD and HTN.   Pt complains of constant, moderate right sided hip pain onset 24 hours PTA. Pt was walking 2 nights ago when he heard a "pop" and the yesterday morning felt his hip "give out on him". This is not the first time experiencing this pain before. Pt has degenerative arthritis and has chronic back pain from his neck down to his right hip. Pt has sciatica in lower back. He has fallen 3x in 2 weeks. He has an appointment to received a cortisone injection. Pt receives injections for back and hip pain.  He also complains of gradual onset, episodic vomiting for 1 day. He had 2 episodes of vomiting yesterday and reports associated nausea. Nausea medication that he received in ED has worn off.    The history is provided by the patient. No language interpreter was used.  Hip Pain  This is a new problem. The current episode started 12 to 24 hours ago. The problem occurs constantly. The problem has not changed since onset.The symptoms are aggravated by walking, standing and twisting.   Past Medical History:  Diagnosis Date  . Back pain   . CAD (coronary artery disease)    Nonobstructive  . Hyperlipidemia   . Hypertension   . IBS (irritable bowel syndrome)   . OSA (obstructive sleep apnea)   . Pancreatitis   . Peptic ulcer   . PUD (peptic ulcer disease)   . Type 2 diabetes mellitus [facility])     Patient Active Problem List   Diagnosis Date Noted  . Type 2 diabetes mellitus with vascular disease ([place]) 05/31/2016  . Normocytic normochromic anemia 05/31/2016    . Chest pain 05/31/2016  . CAD (coronary artery disease) 01/06/2016  . DVT (deep venous thrombosis) ([place]) 01/06/2016  . Lactic acid acidosis 05/28/2014  . Abdominal pain 05/28/2014  . Nonspecific chest pain 01/29/2014  . Uncontrolled secondary diabetes with peripheral neuropathy ([place]) 01/29/2014  . Morbid obesity ([place]) 01/29/2014  . Snoring 01/29/2014  . Dyslipidemia 01/29/2014  . HTN (hypertension) 01/29/2014  . Abnormal nuclear stress test 01/29/2014    Past Surgical History:  Procedure Laterality Date  . CARDIAC CATHETERIZATION N/A 05/31/2016   Procedure: Left Heart Cath and Coronary Angiography;  Surgeon: Peter M Martinique, MD;  Location: [place] CV LAB;  Service: Cardiovascular;  Laterality: N/A;  . CARDIAC CATHETERIZATION N/A 05/31/2016   Procedure: Intravascular Pressure Wire/FFR Study;  Surgeon: Peter M Martinique, MD;  Location: [place] CV LAB;  Service: Cardiovascular;  Laterality: N/A;  . CARDIAC CATHETERIZATION N/A 05/31/2016   Procedure: Coronary Stent Intervention;  Surgeon: Peter M Martinique, MD;  Location: [place] CV LAB;  Service: Cardiovascular;  Laterality: N/A;  . LEFT HEART CATHETERIZATION WITH CORONARY ANGIOGRAM N/A 02/03/2014   Procedure: LEFT HEART CATHETERIZATION WITH CORONARY ANGIOGRAM;  Surgeon: Pixie Casino, MD;  Location: [facility] CATH LAB;  Service: Cardiovascular;  Laterality: N/A;       Home Medications    Prior to Admission medications   Medication Sig Start Date End Date Taking? Authorizing Provider  amLODipine (NORVASC) 10 MG tablet Take 10 mg by mouth daily.   Yes Historical Provider, MD  aspirin 81 MG chewable tablet Chew 1 tablet (81 mg total) by mouth daily. 06/01/16  Yes Elwin Mocha, MD  atorvastatin (LIPITOR) 80 MG tablet Take 1 tablet (80 mg total) by mouth daily. 01/06/16  Yes Lelon Perla, MD  clopidogrel (PLAVIX) 75 MG tablet Take 75 mg by mouth daily.   Yes Historical Provider, MD  famotidine (PEPCID) 20 MG tablet Take 20 mg by mouth  2 (two) times daily.   Yes Historical Provider, MD  furosemide (LASIX) 40 MG tablet Take 40 mg by mouth 2 (two) times daily.   Yes Historical Provider, MD  gabapentin (NEURONTIN) 300 MG capsule Take 300 mg by mouth 2 (two) times daily. Take one tablet by mouth in the morning and lunch, then take two at dinner and 1 tablet at bedtime per patient    Yes Historical Provider, MD  glipiZIDE (GLUCOTROL) 10 MG tablet Take 1 tablet by mouth 2 (two) times daily. 11/13/15  Yes Historical Provider, MD  hydrALAZINE (APRESOLINE) 50 MG tablet Take 1 tablet (50 mg total) by mouth every 8 (eight) hours. Patient taking differently: Take 50 mg by mouth 2 (two) times daily.  05/29/14  Yes Hosie Poisson, MD  isosorbide dinitrate (ISORDIL) 30 MG tablet Take 30 mg by mouth at bedtime.   Yes Historical Provider, MD  lisinopril (PRINIVIL,ZESTRIL) 40 MG tablet Take 40 mg by mouth daily.   Yes Historical Provider, MD  metFORMIN (GLUCOPHAGE) 1000 MG tablet Take 1,000 mg by mouth 2 (two) times daily with a meal.   Yes Historical Provider, MD  methocarbamol (ROBAXIN) 750 MG tablet Take 750 mg by mouth 3 (three) times daily as needed for muscle spasms.  10/12/16  Yes Historical Provider, MD  metoprolol tartrate (LOPRESSOR) 50 MG tablet Take 1 tablet (50 mg total) by mouth 2 (two) times daily. 06/01/16  Yes Elwin Mocha, MD  pantoprazole (PROTONIX) 20 MG tablet Take 20 mg by mouth daily.  10/04/16  Yes Historical Provider, MD  potassium chloride SA (K-DUR,KLOR-CON) 20 MEQ tablet Take 20 mEq by mouth daily.   Yes Historical Provider, MD  sertraline (ZOLOFT) 50 MG tablet Take 50 mg by mouth daily.   Yes Historical Provider, MD  sitaGLIPtin (JANUVIA) 100 MG tablet Take 100 mg by mouth daily.   Yes Historical Provider, MD  traMADol (ULTRAM) 50 MG tablet Take 1 tablet by mouth twice daily AS NEEDED PAIN 10/04/16  Yes Historical Provider, MD  traZODone (DESYREL) 50 MG tablet Take 50 mg by mouth at bedtime.   Yes Historical Provider, MD   ondansetron (ZOFRAN ODT) 4 MG disintegrating tablet Take 1 tablet (4 mg total) by mouth every 8 (eight) hours as needed for nausea or vomiting. 10/29/16   Alfonzo Beers, MD  oxyCODONE-acetaminophen (PERCOCET/ROXICET) 5-325 MG tablet Take 1-2 tablets by mouth every 6 (six) hours as needed for severe pain. 10/29/16   Alfonzo Beers, MD    Family History Family History  Problem Relation Age of Onset  . Cancer Father   . Hypertension Mother   . Diabetes Mother   . Hypertension Brother   . Hypertension Sister     Social History Social History  Substance Use Topics  . Smoking status: Never Smoker  . Smokeless tobacco: Never Used  . Alcohol use No     Allergies   Coconut oil; Aspirin; and Ibuprofen   Review of Systems Review of Systems  Constitutional: Negative for fever.  Gastrointestinal: Positive for nausea and vomiting.  Musculoskeletal: Positive for arthralgias.  All other systems reviewed and are negative.  Physical Exam Updated Vital Signs BP 123/77 (BP Location: Right Arm)   Pulse 85   Temp 98.5 F (36.9 C) (Oral)   Resp 16   Ht 5\' 10"  (1.778 m)   Wt (!) 138.8 kg   SpO2 94%   BMI 43.91 kg/m  Vitals reviewed Physical Exam Physical Examination: General appearance - alert, well appearing, and in no distress Mental status - alert, oriented to person, place, and time Eyes -nno conjunctival injection, no scleral icterus Chest - clear to auscultation, no wheezes, rales or rhonchi, symmetric air entry Heart - normal rate, regular rhythm, normal S1, S2, no murmurs, rubs, clicks or gallops Abdomen - soft, nontender, nondistended, no masses or organomegaly Back exam -no midline tenderness to palpation, no CVA Tenderness, ttp over right sided of paralumbar musculature Neurological - alert, oriented x 3, normal speech, strength 5/5 in lower extremitiies bilaterally, pt with limp on right side walks with cane all at baseline per patient Musculoskeletal - no joint tenderness,  deformity or swelling, no pain with ROM of right hip Extremities - peripheral pulses normal, no pedal edema, no clubbing or cyanosis Skin - normal coloration and turgor, no rashes   ED Treatments / Results  DIAGNOSTIC STUDIES: Oxygen Saturation is 97% on RA, normal by my interpretation.    COORDINATION OF CARE: 12:43 AM Discussed treatment plan with pt at bedside which includes Zofran and Percocet and pt agreed to plan.  Labs (all labs ordered are listed, but only abnormal results are displayed) Labs Reviewed  COMPREHENSIVE METABOLIC PANEL - Abnormal; Notable for the following:       Result Value   Chloride 99 (*)    Glucose, Bld 175 (*)    All other components within normal limits  CBC - Abnormal; Notable for the following:    WBC 12.7 (*)    Hemoglobin 12.9 (*)    All other components within normal limits  URINALYSIS, ROUTINE W REFLEX MICROSCOPIC - Abnormal; Notable for the following:    Glucose, UA >=500 (*)    Ketones, ur 5 (*)    All other components within normal limits  LIPASE, BLOOD    EKG  EKG Interpretation None       Radiology No results found.  Procedures Procedures (including critical care time)  Medications Ordered in ED Medications  ondansetron (ZOFRAN-ODT) disintegrating tablet 4 mg (4 mg Oral Given 10/28/16 1631)  ondansetron (ZOFRAN-ODT) disintegrating tablet 4 mg (4 mg Oral Given 10/29/16 0119)  oxyCODONE-acetaminophen (PERCOCET/ROXICET) 5-325 MG per tablet 1 tablet (1 tablet Oral Given 10/29/16 0119)     Initial Impression / Assessment and Plan / ED Course  I have reviewed the triage vital signs and the nursing notes.  Pertinent labs & imaging results that were available during my care of the patient were reviewed by me and considered in my medical decision making (see chart for details).  Clinical Course     Pt presenting with c/o right hip pain- pt states he is able to bear weight on hip- on exam there is no pain with internal or external  rotation. No trauma. Pain is more likely due to radicular pain from low back.  No midline low back tenderness. No signs or sympoms of cauda equina.  Discharged with strict return precautions.  Pt agreeable with plan.  Final Clinical Impressions(s) / ED Diagnoses  Final diagnoses:  Right hip pain  Non-intractable vomiting with nausea, unspecified vomiting type    New Prescriptions Discharge Medication List as of 10/29/2016  1:38 AM    START taking these medications   Details  ondansetron (ZOFRAN ODT) 4 MG disintegrating tablet Take 1 tablet (4 mg total) by mouth every 8 (eight) hours as needed for nausea or vomiting., Starting Sat 10/29/2016, Print    oxyCODONE-acetaminophen (PERCOCET/ROXICET) 5-325 MG tablet Take 1-2 tablets by mouth every 6 (six) hours as needed for severe pain., Starting Sat 10/29/2016, Print      I personally performed the services described in this documentation, which was scribed in my presence. The recorded information has been reviewed and is accurate.     Alfonzo Beers, MD 11/02/16 [phone]

## 2016-10-31 ENCOUNTER — Telehealth: Payer: Self-pay | Admitting: Cardiology

## 2016-10-31 NOTE — Telephone Encounter (Signed)
New Message   Sammie @ [facility] healthcare called to get surgical clearance. Wanted to know if it okay for pt to stop taking   clopidogrel (PLAVIX) 75 MG tablet   Would like call back or fax.

## 2016-10-31 NOTE — Telephone Encounter (Signed)
Left msg to call or fax clearance request.

## 2016-11-01 NOTE — Telephone Encounter (Signed)
Faxed advisements back to requesting personnel.

## 2016-11-01 NOTE — Telephone Encounter (Signed)
Patient is needing the okay to stop plavix prior to epidural steroid injection. Per dr Stanford Breed, patient CAN NOT stop plavix for elective procedure. (DES to LAD 05-31-16). If needed, delay procedure until mid febuary and could d/c plavix for 5-7 days prior to procedure at that time; ASA must be continued.

## 2016-11-03 ENCOUNTER — Telehealth: Payer: Self-pay | Admitting: Cardiology

## 2016-11-03 NOTE — Telephone Encounter (Signed)
From recent note: "Patient is needing the okay to stop plavix prior to epidural steroid injection. Per dr Stanford Breed, patient CAN NOT stop plavix for elective procedure. (DES to LAD 05-31-16). If needed, delay procedure until mid febuary and could d/c plavix for 5-7 days prior to procedure at that time; ASA must be continued."   Spoke to patient, he's wanting to get this procedure done. He understands the risks of interruption to plavix and ASA and that this is not advised prior to February. Recommended he wait until February to have this procedure done. Pt voiced understanding. He will contact [facility] providers regarding options and call back if further needs.

## 2016-11-03 NOTE — Telephone Encounter (Signed)
New Message  Pt voiced he is calling wanting to speak with nurse regarding a procedure he is having.  Please f/u with pt

## 2016-11-18 ENCOUNTER — Telehealth: Payer: Self-pay | Admitting: *Deleted

## 2016-11-18 NOTE — Telephone Encounter (Signed)
Patient is needing clearance for dental cleaning and possible fillings. They are also asking if he requires antibiotics and if he can stop his blood thinners- plavix, prior to the procedure. Will forward for dr Stanford Breed review

## 2016-11-19 NOTE — Telephone Encounter (Signed)
Does not need sbe prophylaxis; cannot stop plavix given recent [place]

## 2016-11-21 ENCOUNTER — Emergency Department (HOSPITAL_COMMUNITY)
Admission: EM | Admit: 2016-11-21 | Discharge: 2016-11-22 | Disposition: A | Discharge status: Home / Self Care | Payer: Medicaid Other | Attending: Emergency Medicine | Admitting: Emergency Medicine

## 2016-11-21 ENCOUNTER — Emergency Department (HOSPITAL_COMMUNITY): Payer: Medicaid Other

## 2016-11-21 ENCOUNTER — Telehealth: Payer: Self-pay | Admitting: Cardiology

## 2016-11-21 ENCOUNTER — Encounter (HOSPITAL_COMMUNITY): Payer: Self-pay

## 2016-11-21 DIAGNOSIS — Z7902 Long term (current) use of antithrombotics/antiplatelets: Secondary | ICD-10-CM | POA: Diagnosis not present

## 2016-11-21 DIAGNOSIS — I2510 Atherosclerotic heart disease of native coronary artery without angina pectoris: Secondary | ICD-10-CM | POA: Diagnosis not present

## 2016-11-21 DIAGNOSIS — I10 Essential (primary) hypertension: Secondary | ICD-10-CM | POA: Diagnosis not present

## 2016-11-21 DIAGNOSIS — R079 Chest pain, unspecified: Secondary | ICD-10-CM

## 2016-11-21 DIAGNOSIS — Z7982 Long term (current) use of aspirin: Secondary | ICD-10-CM | POA: Diagnosis not present

## 2016-11-21 DIAGNOSIS — Z7984 Long term (current) use of oral hypoglycemic drugs: Secondary | ICD-10-CM | POA: Diagnosis not present

## 2016-11-21 DIAGNOSIS — E119 Type 2 diabetes mellitus without complications: Secondary | ICD-10-CM | POA: Diagnosis not present

## 2016-11-21 DIAGNOSIS — R0789 Other chest pain: Secondary | ICD-10-CM

## 2016-11-21 DIAGNOSIS — Z79899 Other long term (current) drug therapy: Secondary | ICD-10-CM | POA: Diagnosis not present

## 2016-11-21 HISTORY — DX: Unspecified osteoarthritis, unspecified site: M19.90

## 2016-11-21 LAB — CBC
HCT: 39.5 % (ref 39.0–52.0)
Hemoglobin: 12.6 g/dL — ABNORMAL LOW (ref 13.0–17.0)
MCH: 29.8 pg (ref 26.0–34.0)
MCHC: 31.9 g/dL (ref 30.0–36.0)
MCV: 93.4 fL (ref 78.0–100.0)
Platelets: 215 10*3/uL (ref 150–400)
RBC: 4.23 MIL/uL (ref 4.22–5.81)
RDW: 14.3 % (ref 11.5–15.5)
WBC: 10.1 10*3/uL (ref 4.0–10.5)

## 2016-11-21 LAB — BASIC METABOLIC PANEL
Anion gap: 13 (ref 5–15)
BUN: 11 mg/dL (ref 6–20)
CO2: 23 mmol/L (ref 22–32)
Calcium: 9.5 mg/dL (ref 8.9–10.3)
Chloride: 101 mmol/L (ref 101–111)
Creatinine, Ser: 0.82 mg/dL (ref 0.61–1.24)
GFR calc Af Amer: >60 mL/min (ref >60)
GFR calc non Af Amer: >60 mL/min (ref >60)
Glucose, Bld: 293 mg/dL — ABNORMAL HIGH (ref 65–99)
Potassium: 3.1 mmol/L — ABNORMAL LOW (ref 3.5–5.1)
Sodium: 137 mmol/L (ref 135–145)

## 2016-11-21 LAB — I-STAT TROPONIN, ED: Troponin i, poc: 0 ng/mL (ref 0.00–0.08)

## 2016-11-21 MED ORDER — ACETAMINOPHEN 500 MG PO TABS
1000.0000 mg | ORAL_TABLET | Freq: Once | ORAL | Status: AC
  Administered 2016-11-22: 1000 mg via ORAL
  Filled 2016-11-21: qty 2

## 2016-11-21 MED ORDER — NITROGLYCERIN 0.4 MG SL SUBL: 0.4000 mg | SUBLINGUAL_TABLET | SUBLINGUAL | 4 refills | Status: DC | PRN

## 2016-11-21 NOTE — ED Notes (Signed)
Pt called out for something to drink and headache

## 2016-11-21 NOTE — Telephone Encounter (Signed)
Spoke with patient Completed rehab - chest pain off /on for 3 days with blurry vision.  No chest pan at present rehab informe patient to call office.   Patient states he did take medication to today and eat breakfasr .   blood sugar this morning 209 .  blood pressure per  Patient  132/60 earlier at rehab then dropped 102/?  Reviewed information with D.O.D ( Dr Sallyanne Kuster)  Instruction given to patient  How to use nitroglycerin  As well symptoms continue to call 911 go to ER. APPOINTMENT SCHEDULE FOR TOMORRROW

## 2016-11-21 NOTE — Telephone Encounter (Signed)
Pt c/o of Chest Pain: STAT if CP now or developed within 24 hours  1. Are you having CP right now?yes  2. Are you experiencing any other symptoms (ex. SOB, nausea, vomiting, sweating)? Blurred Vision and headaches, SOB( nausea and vomitting was on yesterday 11/20/16)  3. How long have you been experiencing CP? 3 days   4. Is your CP continuous or coming and going? Coming and going   5. Have you taken Nitroglycerin? no ?

## 2016-11-21 NOTE — Progress Notes (Signed)
Cardiology Office Note    Date:  11/22/2016   ID:  [name], DOB October 03, 1961, MRN [id number]  PCP:  [name], FNP  Cardiologist:  Dr. Stanford Breed   Chief Complaint  Patient presents with  . Chest Pain    History of Present Illness:    [name] is a 56 y.o. male with past medical history of CAD (s/p DES to LAD in 05/2016), HTN, HLD, Type 2 DM, and OSA (on CPAP) who presents to the office today for evaluation of chest pain.   Seen by Dr. Stanford Breed in 08/2016. Reported having left-sided chest pain which he described as a shooting pain occurring mostly at night. Denied any exertional symptoms at that time EKG was obtained which showed no acute changes and he pain was thought to be atypical for a cardiac etiology.   He called the office yesterday reporting repeat episodes of chest discomfort which had been occurring for the past 3 days. He was added on to my scheduled to be seen the following day but his symptoms persisted so he went to the emergency department. While there, 3 troponin values were negative. CXR showed no active cardiopulmonary disease. EKG showed NSR, HR 82, in no acute ST or T-wave changes. He was discharged and instructed to follow up with Cardiology.  In talking with the patient today, he denies any repeat episodes of chest discomfort since being seen in the ER. Prior to that he had been experiencing chest pain for the past 3-4 days. Reports developing a dry cough and having worsening chest pain when coughing or trying to take a deep breath. Did not take any sublingual nitroglycerin as he has not had the prescription for this filled.   Denies any exertional symptoms. Has been going to cardiac rehabilitation and participating in activities. He reports developing an aching sensation along his chest when moving his arms on the exercise bike. This pain resolves once he holds his arm still.  Reports good compliance with his medications including ASA and  Plavix. Denies any evidence of active bleeding. Does report bruising more easily.   Past Medical History:  Diagnosis Date  . Arthritis   . Back pain   . CAD (coronary artery disease)    a. s/p DES to LAD in 05/2016  . Hyperlipidemia   . Hypertension   . IBS (irritable bowel syndrome)   . OSA (obstructive sleep apnea)   . Pancreatitis   . Peptic ulcer   . PUD (peptic ulcer disease)   . Type 2 diabetes mellitus ([place])     Past Surgical History:  Procedure Laterality Date  . CARDIAC CATHETERIZATION N/A 05/31/2016   Procedure: Left Heart Cath and Coronary Angiography;  Surgeon: Peter M Martinique, MD;  Location: [facility];  Service: Cardiovascular;  Laterality: N/A;  . CARDIAC CATHETERIZATION N/A 05/31/2016   Procedure: Intravascular Pressure Wire/FFR Study;  Surgeon: Peter M Martinique, MD;  Location: [facility];  Service: Cardiovascular;  Laterality: N/A;  . CARDIAC CATHETERIZATION N/A 05/31/2016   Procedure: Coronary Stent Intervention;  Surgeon: Peter M Martinique, MD;  Location: [facility];  Service: Cardiovascular;  Laterality: N/A;  . LEFT HEART CATHETERIZATION WITH CORONARY ANGIOGRAM N/A 02/03/2014   Procedure: LEFT HEART CATHETERIZATION WITH CORONARY ANGIOGRAM;  Surgeon: Pixie Casino, MD;  Location: [facility];  Service: Cardiovascular;  Laterality: N/A;    Current Medications: Outpatient Medications Prior to Visit  Medication Sig Dispense Refill  . amLODipine (NORVASC)  10 MG tablet Take 10 mg by mouth daily.    Marland Kitchen aspirin 81 MG chewable tablet Chew 1 tablet (81 mg total) by mouth daily. 30 tablet 0  . atorvastatin (LIPITOR) 80 MG tablet Take 1 tablet (80 mg total) by mouth daily. 90 tablet 3  . clopidogrel (PLAVIX) 75 MG tablet Take 75 mg by mouth daily.    . famotidine (PEPCID) 20 MG tablet Take 20 mg by mouth 2 (two) times daily.    . furosemide (LASIX) 40 MG tablet Take 40 mg by mouth 2 (two) times daily.    Marland Kitchen gabapentin (NEURONTIN) 300 MG capsule Take 300 mg  by mouth 2 (two) times daily. Take one tablet by mouth in the morning and lunch, then take two at dinner and 1 tablet at bedtime per patient     . glipiZIDE (GLUCOTROL) 10 MG tablet Take 1 tablet by mouth 2 (two) times daily.  5  . lisinopril (PRINIVIL,ZESTRIL) 40 MG tablet Take 40 mg by mouth daily.    . metFORMIN (GLUCOPHAGE) 1000 MG tablet Take 1,000 mg by mouth 2 (two) times daily with a meal.    . methocarbamol (ROBAXIN) 750 MG tablet Take 750 mg by mouth 3 (three) times daily as needed for muscle spasms.   2  . metoprolol tartrate (LOPRESSOR) 50 MG tablet Take 1 tablet (50 mg total) by mouth 2 (two) times daily. 60 tablet 0  . nitroGLYCERIN (NITROSTAT) 0.4 MG SL tablet Place 1 tablet (0.4 mg total) under the tongue every 5 (five) minutes as needed for chest pain. 25 tablet 4  . oxyCODONE-acetaminophen (PERCOCET/ROXICET) 5-325 MG tablet Take 1-2 tablets by mouth every 6 (six) hours as needed for severe pain. 6 tablet 0  . pantoprazole (PROTONIX) 20 MG tablet Take 20 mg by mouth daily.     . potassium chloride SA (K-DUR,KLOR-CON) 20 MEQ tablet Take 20 mEq by mouth daily.    . sertraline (ZOLOFT) 50 MG tablet Take 50 mg by mouth daily.    . sitaGLIPtin (JANUVIA) 100 MG tablet Take 100 mg by mouth daily.    . traZODone (DESYREL) 50 MG tablet Take 50 mg by mouth at bedtime.    . isosorbide dinitrate (ISORDIL) 30 MG tablet Take 30 mg by mouth at bedtime.    . ondansetron (ZOFRAN ODT) 4 MG disintegrating tablet Take 1 tablet (4 mg total) by mouth every 8 (eight) hours as needed for nausea or vomiting. 20 tablet 0  . traMADol (ULTRAM) 50 MG tablet Take 1 tablet by mouth twice daily AS NEEDED PAIN    . hydrALAZINE (APRESOLINE) 50 MG tablet Take 1 tablet (50 mg total) by mouth every 8 (eight) hours. (Patient taking differently: Take 50 mg by mouth 2 (two) times daily. ) 90 tablet 0   No facility-administered medications prior to visit.      Allergies:   Coconut oil and Ibuprofen   Social History     Social History  . Marital status: Single    Spouse name: N/A  . Number of children: 3  . Years of education: 12   Occupational History  . Scientist, product/process development   Social History Main Topics  . Smoking status: Never Smoker  . Smokeless tobacco: Never Used  . Alcohol use No  . Drug use: No  . Sexual activity: Not Asked   Other Topics Concern  . None   Social History Narrative  . None     Family History:  The patient's family history  includes Cancer in his father; Diabetes in his mother; Hypertension in his brother, mother, and sister.   Review of Systems:   Please see the history of present illness.     General:  No chills, fever, night sweats or weight changes.  Cardiovascular:  No dyspnea on exertion, edema, orthopnea, palpitations, paroxysmal nocturnal dyspnea. Positive for chest pain.  Dermatological: No rash, lesions/masses Respiratory: No cough, dyspnea Urologic: No hematuria, dysuria Abdominal:   No nausea, vomiting, diarrhea, bright red blood per rectum, melena, or hematemesis Neurologic:  No visual changes, wkns, changes in mental status. All other systems reviewed and are otherwise negative except as noted above.   Physical Exam:    VS:  BP 126/73   Pulse 80   Ht 5\' 10"  (1.778 m)   Wt (!) 315 lb 12.8 oz (143.2 kg)   BMI 45.31 kg/m    General: Obese African American male appearing in no acute distress. Head: Normocephalic, atraumatic, sclera non-icteric, no xanthomas, nares are without discharge.  Neck: No carotid bruits. JVD not elevated.  Lungs: Respirations regular and unlabored, without wheezes or rales.  Heart: Regular rate and rhythm. No S3 or S4.  No murmur, no rubs, or gallops appreciated. Tender to palpation along left pectoral region.  Abdomen: Soft, non-tender, non-distended with normoactive bowel sounds. No hepatomegaly. No rebound/guarding. No obvious abdominal masses. Msk:  Strength and tone appear normal for age. No joint  deformities or effusions. Extremities: No clubbing or cyanosis. No edema.  Distal pedal pulses are 2+ bilaterally. Neuro: Alert and oriented X 3. Moves all extremities spontaneously. No focal deficits noted. Psych:  Responds to questions appropriately with a normal affect. Skin: No rashes or lesions noted  Wt Readings from Last 3 Encounters:  11/22/16 (!) 315 lb 12.8 oz (143.2 kg)  11/21/16 (!) 311 lb (141.1 kg)  10/28/16 (!) 306 lb (138.8 kg)     Studies/Labs Reviewed:   EKG:  EKG is ordered today. The ekg ordered today demonstrates NSR, HR 80, with minimal TWI in lateral leads (similar to prior tracings).   Recent Labs: 07/03/2016: B Natriuretic Peptide 16.1 10/28/2016: ALT 30 11/21/2016: BUN 11; Creatinine, Ser 0.82; Hemoglobin 12.6; Platelets 215; Potassium 3.1; Sodium 137   Lipid Panel    Component Value Date/Time   CHOL 82 05/31/2016 0114   TRIG 244 (H) 05/31/2016 0114   HDL 26 (L) 05/31/2016 0114   CHOLHDL 3.2 05/31/2016 0114   VLDL 49 (H) 05/31/2016 0114   LDLCALC 7 05/31/2016 0114    Additional studies/ records that were reviewed today include:   Cardiac Catheterization: 05/2016  Mid RCA lesion, 25 %stenosed.  A STENT SYNERGY DES 4X16 drug eluting stent was successfully placed.  Mid LAD lesion, 70 %stenosed.  Post intervention, there is a 0% residual stenosis.   1. Single vessel obstructive CAD. 70% mid LAD with abnormal FFR of 0.67.  2. Successful stenting of the mid LAD with DES 3. Elevated LVEDP  Plan: DAPT for one year. Anticipate DC in am.   Echocardiogram: 05/2016 Study Conclusions  - Left ventricle: The cavity size was normal. There was mild   concentric hypertrophy. Systolic function was normal. The   estimated ejection fraction was in the range of 55% to 60%. Wall   motion was normal; there were no regional wall motion   abnormalities. Left ventricular diastolic function parameters   were normal. - Aortic valve: Transvalvular velocity was  within the normal range.   There was no stenosis. There was no regurgitation.  Valve area   (VTI): 2.67 cm^2. Valve area (Vmax): 2.23 cm^2. Valve area   (Vmean): 2.09 cm^2. - Mitral valve: Transvalvular velocity was within the normal range.   There was no evidence for stenosis. There was trivial   regurgitation. - Right ventricle: The cavity size was normal. Wall thickness was   normal. Systolic function was normal. - Atrial septum: No defect or patent foramen ovale was identified   by color flow Doppler. - Tricuspid valve: There was no regurgitation.  Assessment:    1. Atypical chest pain   2. Coronary artery disease involving native coronary artery of native heart with other form of angina pectoris ([place])   3. Essential hypertension   4. Hyperlipidemia LDL goal <70      Plan:   In order of problems listed above:  1. Atypical Chest Pain - has reported episodes of chest pain ever since stent placement in 05/2016. Presented to the ER yesterday with chest pain for the past 3 days, with CXR showing no acute abnormalities and troponin values x3 being negative. EKG without acute ischemic changes.  - reports his pain has been worse with coughing or trying to take a deep breath. Can last from minutes up to hours. No association with exertion or lying down. Pain is reproducible with palpation on examination. Repeat EKG without acute ischemic changes.  - overall his pain seems atypical for UA and most consistent with MSK/Pleuritic etiology. Would not pursue further ischemic evaluation at this time. Reports SL NTG helped briefly in the ER. Will increase his Isordil from 30mg  daily to 30mg  BID as symptoms occur mostly during the day and he has only been taking one dose in the evening hours.   2. CAD - s/p DES to LAD in 05/2016. Cath only showing a residual 25% stenosis along the RCA. Report thoroughly reviewed with the patient today. - continue ASA, Plavix, BB, and statin therapy.   3.  Essential HTN - BP well-controlled at 126/73 during today's visit.  - continue current medication regimen.   4. HLD - Lipid Panel in 05/2016 showed total cholesterol 82, HDL 26, and LDL 7 (?). - not fasting today. Needs repeat Lipid Panel/LFT's. Says he has an upcoming appointment with his PCP and will have these checked at that time. - continue Atorvastatin 80mg  daily. Can likely decrease to 20mg  or 40mg  daily once FLP obtained. Goal LDL < 70.   Medication Adjustments/Labs and Tests Ordered: Current medicines are reviewed at length with the patient today.  Concerns regarding medicines are outlined above.  Medication changes, Labs and Tests ordered today are listed in the Patient Instructions below. Patient Instructions  Medication Instructions:  INCREASE isosorbide (Isordil) to 30 mg (1 tablet) two times daily.  Labwork: NONE  Testing/Procedures: NONE  Follow-Up: Your physician wants you to follow-up in: 3 MONTHS WITH DR. CRENSHAW. You will receive a reminder letter in the mail two months in advance. If you don't receive a letter, please call our office to schedule the follow-up appointment.  If you need a refill on your cardiac medications before your next appointment, please call your pharmacy.   Weston Brass Erma Heritage, PA  11/22/2016 7:44 PM    [facility] [address], [place], [place]  [address] Phone: [phone]; Fax: [phone]  [address] [place], [place] [address] Phone: [phone]

## 2016-11-21 NOTE — ED Triage Notes (Signed)
Pt brought in by [facility]. Pt c/o 10/10 sharp chest pain for past 3 days. Pt states chest pain started to radiate to left shoulder 2 days ago. C/o nausea, diarrhea, hip pain, and should pain. Pt took 324 of ASA before visit and received 2 nitro on transport with no relief. Hx of stent placement and Dm.

## 2016-11-21 NOTE — ED Notes (Signed)
Patient transported to X-ray 

## 2016-11-22 ENCOUNTER — Ambulatory Visit (INDEPENDENT_AMBULATORY_CARE_PROVIDER_SITE_OTHER): Payer: Medicaid Other | Admitting: Student

## 2016-11-22 ENCOUNTER — Encounter: Payer: Self-pay | Admitting: Student

## 2016-11-22 VITALS — BP 126/73 | HR 80 | Ht 70.0 in | Wt 315.8 lb

## 2016-11-22 DIAGNOSIS — E785 Hyperlipidemia, unspecified: Secondary | ICD-10-CM | POA: Insufficient documentation

## 2016-11-22 DIAGNOSIS — R0789 Other chest pain: Secondary | ICD-10-CM

## 2016-11-22 DIAGNOSIS — I10 Essential (primary) hypertension: Secondary | ICD-10-CM

## 2016-11-22 DIAGNOSIS — I25118 Atherosclerotic heart disease of native coronary artery with other forms of angina pectoris: Secondary | ICD-10-CM | POA: Diagnosis not present

## 2016-11-22 LAB — I-STAT TROPONIN, ED
Troponin i, poc: 0 ng/mL (ref 0.00–0.08)
Troponin i, poc: 0 ng/mL (ref 0.00–0.08)

## 2016-11-22 MED ORDER — ISOSORBIDE DINITRATE 30 MG PO TABS: 30.0000 mg | ORAL_TABLET | Freq: Two times a day (BID) | ORAL | 4 refills | Status: DC

## 2016-11-22 MED ORDER — HYDROCODONE-ACETAMINOPHEN 5-325 MG PO TABS
1.0000 | ORAL_TABLET | Freq: Once | ORAL | Status: AC
Start: 2016-11-22 — End: 2016-11-22
  Administered 2016-11-22: 1 via ORAL
  Filled 2016-11-22: qty 1

## 2016-11-22 NOTE — Discharge Instructions (Signed)
You will need to see your cardiologist today.  Return here as needed.  Your testing here tonight did not show significant abnormality

## 2016-11-22 NOTE — Telephone Encounter (Signed)
Will fax this note to the number provided. 

## 2016-11-22 NOTE — ED Notes (Signed)
EDP at bedside  

## 2016-11-22 NOTE — ED Notes (Signed)
Pt c/o headache returning.

## 2016-11-22 NOTE — Patient Instructions (Signed)
Medication Instructions:  INCREASE isosorbide (Isordil) to 30 mg (1 tablet) two times daily.  Labwork: NONE  Testing/Procedures: NONE  Follow-Up: Your physician wants you to follow-up in: 3 MONTHS WITH DR. CRENSHAW. You will receive a reminder letter in the mail two months in advance. If you don't receive a letter, please call our office to schedule the follow-up appointment.      If you need a refill on your cardiac medications before your next appointment, please call your pharmacy.

## 2016-11-23 NOTE — ED Provider Notes (Signed)
[place] DEPT Provider Note   CSN: [id number] Arrival date & time: 11/21/16  2208     History   Chief Complaint Chief Complaint  Patient presents with  . Chest Pain    HPI [name] is a 56 y.o. male.  HPI Patient presents to the emergency department with a 2 day history of sharp chest discomfort in the left lateral chest.  The patient states that movement and palpation makes the pain worse.  Patient states that he did not take any medications prior to arrival for his symptoms.  Patient states that he has been seen recently for various pain complaints.  Patient states that he did take some aspirin earlier today. The patient denies shortness of breath, headache,blurred vision, neck pain, fever, cough, weakness, numbness, dizziness, anorexia, edema, abdominal pain, nausea, vomiting, diarrhea, rash, back pain, dysuria, hematemesis, bloody stool, near syncope, or syncope.  Patient states he has not had a cardiac history of having one stent placed Past Medical History:  Diagnosis Date  . Arthritis   . Back pain   . CAD (coronary artery disease)    a. s/p DES to LAD in 05/2016  . Hyperlipidemia   . Hypertension   . IBS (irritable bowel syndrome)   . OSA (obstructive sleep apnea)   . Pancreatitis   . Peptic ulcer   . PUD (peptic ulcer disease)   . Type 2 diabetes mellitus [facility])     Patient Active Problem List   Diagnosis Date Noted  . Hyperlipidemia LDL goal <70 11/22/2016  . Type 2 diabetes mellitus with vascular disease ([place]) 05/31/2016  . Normocytic normochromic anemia 05/31/2016  . Chest pain 05/31/2016  . CAD (coronary artery disease) 01/06/2016  . DVT (deep venous thrombosis) ([place]) 01/06/2016  . Lactic acid acidosis 05/28/2014  . Abdominal pain 05/28/2014  . Nonspecific chest pain 01/29/2014  . Uncontrolled secondary diabetes with peripheral neuropathy ([place]) 01/29/2014  . Morbid obesity ([place]) 01/29/2014  . Snoring 01/29/2014  . Dyslipidemia 01/29/2014  .  HTN (hypertension) 01/29/2014  . Abnormal nuclear stress test 01/29/2014    Past Surgical History:  Procedure Laterality Date  . CARDIAC CATHETERIZATION N/A 05/31/2016   Procedure: Left Heart Cath and Coronary Angiography;  Surgeon: Peter M Martinique, MD;  Location: [place] CV LAB;  Service: Cardiovascular;  Laterality: N/A;  . CARDIAC CATHETERIZATION N/A 05/31/2016   Procedure: Intravascular Pressure Wire/FFR Study;  Surgeon: Peter M Martinique, MD;  Location: [place] CV LAB;  Service: Cardiovascular;  Laterality: N/A;  . CARDIAC CATHETERIZATION N/A 05/31/2016   Procedure: Coronary Stent Intervention;  Surgeon: Peter M Martinique, MD;  Location: [place] CV LAB;  Service: Cardiovascular;  Laterality: N/A;  . LEFT HEART CATHETERIZATION WITH CORONARY ANGIOGRAM N/A 02/03/2014   Procedure: LEFT HEART CATHETERIZATION WITH CORONARY ANGIOGRAM;  Surgeon: Pixie Casino, MD;  Location: [facility] CATH LAB;  Service: Cardiovascular;  Laterality: N/A;       Home Medications    Prior to Admission medications   Medication Sig Start Date End Date Taking? Authorizing Provider  amLODipine (NORVASC) 10 MG tablet Take 10 mg by mouth daily.   Yes Historical Provider, MD  aspirin 81 MG chewable tablet Chew 1 tablet (81 mg total) by mouth daily. 06/01/16  Yes Elwin Mocha, MD  atorvastatin (LIPITOR) 80 MG tablet Take 1 tablet (80 mg total) by mouth daily. 01/06/16  Yes Lelon Perla, MD  clopidogrel (PLAVIX) 75 MG tablet Take 75 mg by mouth daily.   Yes Historical Provider,  MD  famotidine (PEPCID) 20 MG tablet Take 20 mg by mouth 2 (two) times daily.   Yes Historical Provider, MD  furosemide (LASIX) 40 MG tablet Take 40 mg by mouth 2 (two) times daily.   Yes Historical Provider, MD  gabapentin (NEURONTIN) 300 MG capsule Take 300 mg by mouth 2 (two) times daily. Take one tablet by mouth in the morning and lunch, then take two at dinner and 1 tablet at bedtime per patient    Yes Historical Provider, MD  glipiZIDE  (GLUCOTROL) 10 MG tablet Take 1 tablet by mouth 2 (two) times daily. 11/13/15  Yes Historical Provider, MD  lisinopril (PRINIVIL,ZESTRIL) 40 MG tablet Take 40 mg by mouth daily.   Yes Historical Provider, MD  metFORMIN (GLUCOPHAGE) 1000 MG tablet Take 1,000 mg by mouth 2 (two) times daily with a meal.   Yes Historical Provider, MD  methocarbamol (ROBAXIN) 750 MG tablet Take 750 mg by mouth 3 (three) times daily as needed for muscle spasms.  10/12/16  Yes Historical Provider, MD  metoprolol tartrate (LOPRESSOR) 50 MG tablet Take 1 tablet (50 mg total) by mouth 2 (two) times daily. 06/01/16  Yes Elwin Mocha, MD  nitroGLYCERIN (NITROSTAT) 0.4 MG SL tablet Place 1 tablet (0.4 mg total) under the tongue every 5 (five) minutes as needed for chest pain. 11/21/16 02/19/17 Yes Mihai Croitoru, MD  pantoprazole (PROTONIX) 20 MG tablet Take 20 mg by mouth daily.  10/04/16  Yes Historical Provider, MD  potassium chloride SA (K-DUR,KLOR-CON) 20 MEQ tablet Take 20 mEq by mouth daily.   Yes Historical Provider, MD  sertraline (ZOLOFT) 50 MG tablet Take 50 mg by mouth daily.   Yes Historical Provider, MD  sitaGLIPtin (JANUVIA) 100 MG tablet Take 100 mg by mouth daily.   Yes Historical Provider, MD  traZODone (DESYREL) 50 MG tablet Take 50 mg by mouth at bedtime.   Yes Historical Provider, MD  hydrALAZINE (APRESOLINE) 50 MG tablet Take 50 mg by mouth 2 (two) times daily.    Historical Provider, MD  isosorbide dinitrate (ISORDIL) 30 MG tablet Take 1 tablet (30 mg total) by mouth 2 (two) times daily. 11/22/16   Erma Heritage, PA  oxyCODONE-acetaminophen (PERCOCET/ROXICET) 5-325 MG tablet Take 1-2 tablets by mouth every 6 (six) hours as needed for severe pain. 10/29/16   Alfonzo Beers, MD    Family History Family History  Problem Relation Age of Onset  . Cancer Father   . Hypertension Mother   . Diabetes Mother   . Hypertension Brother   . Hypertension Sister     Social History Social History  Substance Use  Topics  . Smoking status: Never Smoker  . Smokeless tobacco: Never Used  . Alcohol use No     Allergies   Coconut oil and Ibuprofen   Review of Systems Review of Systems All other systems negative except as documented in the HPI. All pertinent positives and negatives as reviewed in the HPI.  Physical Exam Updated Vital Signs BP 132/74 (BP Location: Right Arm)   Pulse 86   Resp 16   Ht 5\' 10"  (1.778 m)   Wt (!) 141.1 kg   SpO2 99%   BMI 44.62 kg/m   Physical Exam  Constitutional: He is oriented to person, place, and time. He appears well-developed and well-nourished. No distress.  HENT:  Head: Normocephalic and atraumatic.  Mouth/Throat: Oropharynx is clear and moist.  Eyes: Pupils are equal, round, and reactive to light.  Neck: Normal range of  motion. Neck supple.  Cardiovascular: Normal rate, regular rhythm and normal heart sounds.  Exam reveals no gallop and no friction rub.   No murmur heard. Pulmonary/Chest: Effort normal and breath sounds normal. No respiratory distress. He has no wheezes. He exhibits tenderness.    Abdominal: Soft. Bowel sounds are normal. He exhibits no distension. There is no tenderness.  Neurological: He is alert and oriented to person, place, and time. He exhibits normal muscle tone. Coordination normal.  Skin: Skin is warm and dry. No rash noted. No erythema.  Psychiatric: He has a normal mood and affect. His behavior is normal.  Nursing note and vitals reviewed.    ED Treatments / Results  Labs (all labs ordered are listed, but only abnormal results are displayed) Labs Reviewed  BASIC METABOLIC PANEL - Abnormal; Notable for the following:       Result Value   Potassium 3.1 (*)    Glucose, Bld 293 (*)    All other components within normal limits  CBC - Abnormal; Notable for the following:    Hemoglobin 12.6 (*)    All other components within normal limits  I-STAT TROPOININ, ED  I-STAT TROPOININ, ED  I-STAT TROPOININ, ED     EKG  EKG Interpretation  Date/Time:  Tuesday November 22 2016 03:54:25 EST Ventricular Rate:  71 PR Interval:    QRS Duration: 91 QT Interval:  427 QTC Calculation: 464 R Axis:   62 Text Interpretation:  Sinus rhythm Borderline T wave abnormalities No significant change since last tracing Confirmed by Glynn Octave [phone]) on 11/22/2016 4:48:02 AM Also confirmed by Glynn Octave [phone]), editor Lorenda Cahill CT, [place] [id number])  on 11/22/2016 9:32:34 AM       Radiology Dg Chest 2 View  Result Date: 11/21/2016 CLINICAL DATA:  Central and left chest pain with dyspnea for 3 days. EXAM: CHEST  2 VIEW COMPARISON:  07/22/2016 FINDINGS: The heart size and mediastinal contours are within normal limits. Both lungs are clear. The visualized skeletal structures are unremarkable. IMPRESSION: No active cardiopulmonary disease. Electronically Signed   By: Andreas Newport M.D.   On: 11/21/2016 23:34    Procedures Procedures (including critical care time)  Medications Ordered in ED Medications  acetaminophen (TYLENOL) tablet 1,000 mg (1,000 mg Oral Given 11/22/16 0001)  HYDROcodone-acetaminophen (NORCO/VICODIN) 5-325 MG per tablet 1 tablet (1 tablet Oral Given 11/22/16 0432)     Initial Impression / Assessment and Plan / ED Course  I have reviewed the triage vital signs and the nursing notes.  Pertinent labs & imaging results that were available during my care of the patient were reviewed by me and considered in my medical decision making (see chart for details).     Patient's chest pain seems atypical for cardiac disease based on the fact that he has a very specific area of point tenderness in the left upper chest wall.  I did advise the patient that this still could represent cardiac chest pain and that he would need to keep his appointment with his cardiologist today.  The patient states that he is feeling better at this time and is agreeable to following up with his  cardiologist  Final Clinical Impressions(s) / ED Diagnoses   Final diagnoses:  Atypical chest pain  Chest pain, unspecified type    New Prescriptions Discharge Medication List as of 11/22/2016  5:03 AM       Dalia Heading, PA-C 11/23/16 [id number]    Everlene Balls, MD 11/23/16 1515

## 2016-11-30 ENCOUNTER — Encounter: Payer: Self-pay | Admitting: Internal Medicine

## 2016-11-30 ENCOUNTER — Ambulatory Visit: Payer: Medicaid Other | Admitting: Cardiology

## 2016-11-30 ENCOUNTER — Ambulatory Visit (INDEPENDENT_AMBULATORY_CARE_PROVIDER_SITE_OTHER): Payer: Medicaid Other | Admitting: Internal Medicine

## 2016-11-30 DIAGNOSIS — Z9181 History of falling: Secondary | ICD-10-CM | POA: Diagnosis not present

## 2016-11-30 DIAGNOSIS — Z7984 Long term (current) use of oral hypoglycemic drugs: Secondary | ICD-10-CM

## 2016-11-30 DIAGNOSIS — M25562 Pain in left knee: Secondary | ICD-10-CM

## 2016-11-30 DIAGNOSIS — Z803 Family history of malignant neoplasm of breast: Secondary | ICD-10-CM

## 2016-11-30 DIAGNOSIS — Z0189 Encounter for other specified special examinations: Secondary | ICD-10-CM

## 2016-11-30 DIAGNOSIS — Z809 Family history of malignant neoplasm, unspecified: Secondary | ICD-10-CM | POA: Diagnosis not present

## 2016-11-30 DIAGNOSIS — Z833 Family history of diabetes mellitus: Secondary | ICD-10-CM

## 2016-11-30 DIAGNOSIS — E1159 Type 2 diabetes mellitus with other circulatory complications: Secondary | ICD-10-CM | POA: Diagnosis not present

## 2016-11-30 DIAGNOSIS — R208 Other disturbances of skin sensation: Secondary | ICD-10-CM

## 2016-11-30 DIAGNOSIS — R296 Repeated falls: Secondary | ICD-10-CM | POA: Diagnosis not present

## 2016-11-30 DIAGNOSIS — Z8249 Family history of ischemic heart disease and other diseases of the circulatory system: Secondary | ICD-10-CM

## 2016-11-30 DIAGNOSIS — L609 Nail disorder, unspecified: Secondary | ICD-10-CM

## 2016-11-30 MED ORDER — DICLOFENAC SODIUM 1 % TD GEL: 4.0000 g | Freq: Four times a day (QID) | TRANSDERMAL | 3 refills | Status: DC

## 2016-11-30 NOTE — Progress Notes (Signed)
CC: recent fall    HPI: [name] is a 56 y.o. with past medical history as outlined below who presents to clinic to establish care at the internal medicine center and to establish care. He also has left knee pain related to recent fall. Yesterday he was stepping off of the bus when his right knee gave out and he fell to the ground. He fell and landed on his left knee and right arm. Today he went for right hip injection with his [facility] orthopedic and sports medicine physician. The injection worked to relieve his right arm pain but he is still having some discomfort in his left knee. He has had 4 falls since November. All of these falls have been related to his knee giving out and all except this last one have happened in his home. He is currently working with cardiac rehab 3 days per week and PT for his right ankle two days per week.  Diabetes Mellitus  Home medications include metformin 1000 mg BID, sitagliptin 100 mg, glipizide 10 mg qd, and canagliflozin 100 mg qg. Last A1c 10/2016 8.5. Had a steroid injection the day prior to this office visit and we received a phone call the day following this visit that his AM fasting glucose was 375. He denies nausea or abdominal pain and said he was feeling like his usual self.   Please see problem list for status of the pt's chronic medical problems.  Past Medical History:  Diagnosis Date  . Arthritis   . Back pain   . CAD (coronary artery disease)    a. s/p DES to LAD in 05/2016  . Hyperlipidemia   . Hypertension   . IBS (irritable bowel syndrome)   . OSA (obstructive sleep apnea)   . Pancreatitis   . Peptic ulcer   . PUD (peptic ulcer disease)   . Type 2 diabetes mellitus ([place])    Past Surgical History:  Procedure Laterality Date  . CARDIAC CATHETERIZATION N/A 05/31/2016   Procedure: Left Heart Cath and Coronary Angiography;  Surgeon: Peter M Martinique, MD;  Location: [place] CV LAB;  Service: Cardiovascular;  Laterality: N/A;  .  CARDIAC CATHETERIZATION N/A 05/31/2016   Procedure: Intravascular Pressure Wire/FFR Study;  Surgeon: Peter M Martinique, MD;  Location: [place] CV LAB;  Service: Cardiovascular;  Laterality: N/A;  . CARDIAC CATHETERIZATION N/A 05/31/2016   Procedure: Coronary Stent Intervention;  Surgeon: Peter M Martinique, MD;  Location: [place] CV LAB;  Service: Cardiovascular;  Laterality: N/A;  . LEFT HEART CATHETERIZATION WITH CORONARY ANGIOGRAM N/A 02/03/2014   Procedure: LEFT HEART CATHETERIZATION WITH CORONARY ANGIOGRAM;  Surgeon: Pixie Casino, MD;  Location[facility] C[facility] LAB;  Service: Cardiovascular;  Laterality: N/A;  . left leg stent      Family History  Problem Relation Age of Onset  . Cancer Father   . Hypertension Mother   . Diabetes Mother   . Breast cancer Mother   . Hypertension Brother   . Diabetes Brother   . Hypertension Sister   . Diabetes Sister    Social History   Social History  . Marital status: Single    Spouse name: N/A  . Number of children: 3  . Years of education: 12   Occupational History  . Scientist, product/process development   Social History Main Topics  . Smoking status: Never Smoker  . Smokeless tobacco: Never Used  . Alcohol use No  . Drug use: No  . Sexual activity:  Not on file   Other Topics Concern  . Not on file   Social History Narrative  . No narrative on file     Review of Systems:  Please see each problem below for a pertinent review of systems.  Physical Exam:  Vitals:   11/30/16 1442  BP: 138/67  Pulse: 83  Temp: 98.2 F (36.8 C)  TempSrc: Oral  SpO2: 95%  Weight: (!) 312 lb 4.8 oz (141.7 kg)  Height: 5\' 10"  (1.778 m)   Physical Exam  Constitutional: He is oriented to person, place, and time. He appears well-developed and well-nourished. No distress.  Walking with a cane   HENT:  Head: Normocephalic and atraumatic.  Eyes: Conjunctivae are normal. No scleral icterus.  Cardiovascular: Normal rate and regular rhythm.   No murmur  heard. Pulmonary/Chest: Effort normal. No respiratory distress. He has no wheezes. He has no rales.  Abdominal: Bowel sounds are normal. He exhibits distension. There is no tenderness. There is no guarding.  Musculoskeletal:  Left knee joint line tenderness.  Strength equal and intact with bilateral hip, knee, and ankle flexion and extension   Neurological: He is alert and oriented to person, place, and time.  Skin: Skin is warm and dry. He is not diaphoretic.  Foot exam- no signs of fungal infection or ulcers. Toenails are black. Decreased sensation in feet.   Psychiatric: He has a normal mood and affect.   Assessment & Plan:   See Encounters Tab for problem based charting.  Left knee pain  Has no bruising but joint line tenderness on exam. ROM and strength are intact and equal over both ankles, knees, and hips. He is open to trying gait training physical therapy when his schedule is more open. He agrees to trial of ice, rest, and voltaren gel until then.  - prescribed voltaren gel  - trial of rest and ice  - RTC 2 months   Diabetes Mellitus  A1c is not at goal. Reported that he wasn't taking metformin as prescribed (was taking 500 mg BID, not 1000mg  BID). He is on 4 oral medications and is resistant to starting insulin. There is room to increase his dose of glipizide (max daily dose 15mg ), sitagliptin (max daily dose 100 mg), and canagliflozin (max daily dose 300 mg) in the future. BP is at goal today. - Continue current regiment  -will need close monitoring of his CBG for the next 1-2 weeks, I anticipate that his glucose will gradually improve - continue atorvastatin 80 mg daily  - will need eye exam if there is not one in the records he brings to next visit.  -will need urine microalbumin/ creatinine   Patient discussed with Dr. Dareen Piano

## 2016-11-30 NOTE — Patient Instructions (Addendum)
It was a pleasure meeting you today [name],   For your knee pain, try using voltaren gel START taking Metformin 1000 mg twice daily   Schedule a follow up appointment in two months

## 2016-11-30 NOTE — Assessment & Plan Note (Signed)
Yesterday he was stepping off of the bus when his right knee gave out and he fell to the ground. He fell and landed on his left knee and right arm. Today he went for right hip injection with his [facility] orthopedic and sports medicine physician. The injection worked to relieve his right arm pain but he is still having some discomfort in his left knee. He has had 4 falls since November. All of these falls have been related to his knee giving out and all except this last one have happened in his home. He is currently working with cardiac rehab 3 days per week and PT for his right ankle two days per week.   Has no bruising but joint line tenderness on exam. ROM and strength are intact and equal over both ankles, knees, and hips. He is open to trying gait training physical therapy when his schedule is more open. He agrees to trial of ice, rest, and voltaren gel until then.   - prescribed voltaren gel  - trial of rest and ice  - RTC 2 months

## 2016-12-01 ENCOUNTER — Telehealth: Payer: Self-pay | Admitting: Internal Medicine

## 2016-12-01 ENCOUNTER — Encounter (HOSPITAL_COMMUNITY): Payer: Self-pay | Admitting: Emergency Medicine

## 2016-12-01 ENCOUNTER — Emergency Department (HOSPITAL_COMMUNITY)
Admission: EM | Admit: 2016-12-01 | Discharge: 2016-12-01 | Disposition: A | Discharge status: Home / Self Care | Payer: Medicaid Other | Attending: Emergency Medicine | Admitting: Emergency Medicine

## 2016-12-01 DIAGNOSIS — E1165 Type 2 diabetes mellitus with hyperglycemia: Secondary | ICD-10-CM | POA: Diagnosis not present

## 2016-12-01 DIAGNOSIS — E1151 Type 2 diabetes mellitus with diabetic peripheral angiopathy without gangrene: Secondary | ICD-10-CM | POA: Insufficient documentation

## 2016-12-01 DIAGNOSIS — E1140 Type 2 diabetes mellitus with diabetic neuropathy, unspecified: Secondary | ICD-10-CM | POA: Insufficient documentation

## 2016-12-01 DIAGNOSIS — Z7984 Long term (current) use of oral hypoglycemic drugs: Secondary | ICD-10-CM | POA: Diagnosis not present

## 2016-12-01 DIAGNOSIS — I2510 Atherosclerotic heart disease of native coronary artery without angina pectoris: Secondary | ICD-10-CM | POA: Insufficient documentation

## 2016-12-01 DIAGNOSIS — Z7982 Long term (current) use of aspirin: Secondary | ICD-10-CM | POA: Insufficient documentation

## 2016-12-01 DIAGNOSIS — I10 Essential (primary) hypertension: Secondary | ICD-10-CM | POA: Insufficient documentation

## 2016-12-01 DIAGNOSIS — Z79899 Other long term (current) drug therapy: Secondary | ICD-10-CM | POA: Insufficient documentation

## 2016-12-01 DIAGNOSIS — R739 Hyperglycemia, unspecified: Secondary | ICD-10-CM

## 2016-12-01 LAB — URINALYSIS, ROUTINE W REFLEX MICROSCOPIC
Bacteria, UA: NONE SEEN
Bilirubin Urine: NEGATIVE
Glucose, UA: >=500 mg/dL — AB
Hgb urine dipstick: NEGATIVE
Ketones, ur: 5 mg/dL — AB
Leukocytes, UA: NEGATIVE
Nitrite: NEGATIVE
Protein, ur: NEGATIVE mg/dL
Specific Gravity, Urine: 1.029 (ref 1.005–1.030)
pH: 5 (ref 5.0–8.0)

## 2016-12-01 LAB — CBG MONITORING, ED
Glucose-Capillary: 413 mg/dL — ABNORMAL HIGH (ref 65–99)
Glucose-Capillary: 409 mg/dL — ABNORMAL HIGH (ref 65–99)
Glucose-Capillary: 313 mg/dL — ABNORMAL HIGH (ref 65–99)

## 2016-12-01 LAB — BASIC METABOLIC PANEL
Anion gap: 14 (ref 5–15)
BUN: 16 mg/dL (ref 6–20)
CO2: 22 mmol/L (ref 22–32)
Calcium: 9.9 mg/dL (ref 8.9–10.3)
Chloride: 101 mmol/L (ref 101–111)
Creatinine, Ser: 1.06 mg/dL (ref 0.61–1.24)
GFR calc Af Amer: >60 mL/min (ref >60)
GFR calc non Af Amer: >60 mL/min (ref >60)
Glucose, Bld: 426 mg/dL — ABNORMAL HIGH (ref 65–99)
Potassium: 3.5 mmol/L (ref 3.5–5.1)
Sodium: 137 mmol/L (ref 135–145)

## 2016-12-01 LAB — CBC
HCT: 37.6 % — ABNORMAL LOW (ref 39.0–52.0)
Hemoglobin: 12.4 g/dL — ABNORMAL LOW (ref 13.0–17.0)
MCH: 29.9 pg (ref 26.0–34.0)
MCHC: 33 g/dL (ref 30.0–36.0)
MCV: 90.6 fL (ref 78.0–100.0)
Platelets: 247 10*3/uL (ref 150–400)
RBC: 4.15 MIL/uL — ABNORMAL LOW (ref 4.22–5.81)
RDW: 14 % (ref 11.5–15.5)
WBC: 12.9 10*3/uL — ABNORMAL HIGH (ref 4.0–10.5)

## 2016-12-01 MED ORDER — CANAGLIFLOZIN 100 MG PO TABS: 100.0000 mg | ORAL_TABLET | Freq: Every day | ORAL | 3 refills | Status: DC

## 2016-12-01 MED ORDER — SODIUM CHLORIDE 0.9 % IV BOLUS (SEPSIS)
1000.0000 mL | Freq: Once | INTRAVENOUS | Status: AC
  Administered 2016-12-01: 1000 mL via INTRAVENOUS

## 2016-12-01 MED ORDER — INSULIN ASPART 100 UNIT/ML ~~LOC~~ SOLN
10.0000 [IU] | Freq: Once | SUBCUTANEOUS | Status: AC
  Administered 2016-12-01: 10 [IU] via INTRAVENOUS
  Filled 2016-12-01: qty 1

## 2016-12-01 MED ORDER — OXYCODONE-ACETAMINOPHEN 5-325 MG PO TABS
1.0000 | ORAL_TABLET | Freq: Once | ORAL | Status: AC
  Administered 2016-12-01: 1 via ORAL
  Filled 2016-12-01: qty 1

## 2016-12-01 NOTE — Telephone Encounter (Signed)
He should continue monitoring his blood sugar before and after meals, it should improve slowly over the next 1-2 weeks. Should eat foods that are lower in sugar and drink plenty ofwater and avoid juices and sodas until this (hopefully transient) rise in his blood sugar resolves. Should call us or go to the ED if he develops abdominal pain, vomiting, or neurologic changes.

## 2016-12-01 NOTE — ED Triage Notes (Signed)
Patient reports elevated blood sugar today 400+ with dry mouth , thirst and fatigue , denies fever or chills , he received a Cortizone injection at his right hip yesterday at his PCP clinic . He has not taken his oral diabetic medications this evening .

## 2016-12-01 NOTE — ED Provider Notes (Signed)
[facility] DEPT Provider Note   CSN: [id number] Arrival date & time: 12/01/16  1927     History   Chief Complaint Chief Complaint  Patient presents with  . Hyperglycemia    HPI [name] is a 56 y.o. male.  56 yo M with a cc of hyperglycemia. Going on for the past week after a cortisone injection. Only on oral anticoagulants.    The history is provided by the patient.  Hyperglycemia  Associated symptoms: no abdominal pain, no chest pain, no confusion, no fever, no shortness of breath and no vomiting   Illness  This is a new problem. The current episode started less than 1 hour ago. The problem occurs constantly. The problem has not changed since onset.Pertinent negatives include no chest pain, no abdominal pain, no headaches and no shortness of breath. Nothing aggravates the symptoms. Nothing relieves the symptoms. He has tried nothing for the symptoms. The treatment provided no relief.    Past Medical History:  Diagnosis Date  . Arthritis   . Back pain   . CAD (coronary artery disease)    a. s/p DES to LAD in 05/2016  . Hyperlipidemia   . Hypertension   . IBS (irritable bowel syndrome)   . OSA (obstructive sleep apnea)   . Pancreatitis   . PUD (peptic ulcer disease)   . Type 2 diabetes mellitus [facility])     Patient Active Problem List   Diagnosis Date Noted  . Knee pain, left 11/30/2016  . Hyperlipidemia LDL goal <70 11/22/2016  . Type 2 diabetes mellitus with vascular disease ([place]) 05/31/2016  . Normocytic normochromic anemia 05/31/2016  . CAD (coronary artery disease) 01/06/2016  . DVT (deep venous thrombosis) ([place]) 01/06/2016  . Nonspecific chest pain 01/29/2014  . Uncontrolled secondary diabetes with peripheral neuropathy ([place]) 01/29/2014  . Morbid obesity ([place]) 01/29/2014  . Snoring 01/29/2014  . Dyslipidemia 01/29/2014  . HTN (hypertension) 01/29/2014  . Abnormal nuclear stress test 01/29/2014    Past Surgical History:  Procedure Laterality  Date  . CARDIAC CATHETERIZATION N/A 05/31/2016   Procedure: Left Heart Cath and Coronary Angiography;  Surgeon: Peter M Martinique, MD;  Location: [facility] CV LAB;  Service: Cardiovascular;  Laterality: N/A;  . CARDIAC CATHETERIZATION N/A 05/31/2016   Procedure: Intravascular Pressure Wire/FFR Study;  Surgeon: Peter M Martinique, MD;  Location: [facility] CV LAB;  Service: Cardiovascular;  Laterality: N/A;  . CARDIAC CATHETERIZATION N/A 05/31/2016   Procedure: Coronary Stent Intervention;  Surgeon: Peter M Martinique, MD;  Location: [facility] CV LAB;  Service: Cardiovascular;  Laterality: N/A;  . LEFT HEART CATHETERIZATION WITH CORONARY ANGIOGRAM N/A 02/03/2014   Procedure: LEFT HEART CATHETERIZATION WITH CORONARY ANGIOGRAM;  Surgeon: Pixie Casino, MD;  Location: [facility] CATH LAB;  Service: Cardiovascular;  Laterality: N/A;  . left leg stent          Home Medications    Prior to Admission medications   Medication Sig Start Date End Date Taking? Authorizing Provider  aspirin 81 MG chewable tablet Chew 1 tablet (81 mg total) by mouth daily. 06/01/16   Elwin Mocha, MD  atorvastatin (LIPITOR) 80 MG tablet Take 1 tablet (80 mg total) by mouth daily. 01/06/16   Lelon Perla, MD  canagliflozin [facility]) 100 MG TABS tablet Take 1 tablet (100 mg total) by mouth daily before breakfast. 12/01/16   Ledell Noss, MD  carvedilol (COREG) 25 MG tablet Take 25 mg by mouth 2 (two) times daily with a meal.  Historical Provider, MD  clopidogrel (PLAVIX) 75 MG tablet Take 75 mg by mouth daily.    Historical Provider, MD  diclofenac sodium (VOLTAREN) 1 % GEL Apply 4 g topically 4 (four) times daily. 11/30/16   Ledell Noss, MD  famotidine (PEPCID) 20 MG tablet Take 20 mg by mouth 2 (two) times daily.    Historical Provider, MD  furosemide (LASIX) 40 MG tablet Take 40 mg by mouth 2 (two) times daily.    Historical Provider, MD  gabapentin (NEURONTIN) 300 MG capsule Take 300 mg by mouth 3 (three) times daily. Take 1-4 tablets  three times daily    Historical Provider, MD  glipiZIDE (GLUCOTROL) 10 MG tablet Take 1 tablet by mouth 2 (two) times daily. 11/13/15   Historical Provider, MD  hydrALAZINE (APRESOLINE) 50 MG tablet Take 50 mg by mouth 2 (two) times daily.    Historical Provider, MD  isosorbide dinitrate (ISORDIL) 30 MG tablet Take 1 tablet (30 mg total) by mouth 2 (two) times daily. 11/22/16   Erma Heritage, PA  metFORMIN (GLUCOPHAGE) 1000 MG tablet Take 1,000 mg by mouth 2 (two) times daily with a meal.    Historical Provider, MD  methocarbamol (ROBAXIN) 750 MG tablet Take 750 mg by mouth 3 (three) times daily as needed for muscle spasms.  10/12/16   Historical Provider, MD  pantoprazole (PROTONIX) 20 MG tablet Take 20 mg by mouth daily.  10/04/16   Historical Provider, MD  potassium chloride SA (K-DUR,KLOR-CON) 20 MEQ tablet Take 20 mEq by mouth daily.    Historical Provider, MD  sertraline (ZOLOFT) 50 MG tablet Take 50 mg by mouth daily.    Historical Provider, MD  sitaGLIPtin (JANUVIA) 100 MG tablet Take 100 mg by mouth daily.    Historical Provider, MD  traZODone (DESYREL) 50 MG tablet Take 50 mg by mouth at bedtime.    Historical Provider, MD    Family History Family History  Problem Relation Age of Onset  . Cancer Father   . Hypertension Mother   . Diabetes Mother   . Breast cancer Mother   . Hypertension Brother   . Diabetes Brother   . Hypertension Sister   . Diabetes Sister     Social History Social History  Substance Use Topics  . Smoking status: Never Smoker  . Smokeless tobacco: Never Used  . Alcohol use No     Allergies   Coconut oil and Ibuprofen   Review of Systems Review of Systems  Constitutional: Negative for chills and fever.  HENT: Negative for congestion and facial swelling.   Eyes: Negative for discharge and visual disturbance.  Respiratory: Negative for shortness of breath.   Cardiovascular: Negative for chest pain and palpitations.  Gastrointestinal: Negative  for abdominal pain, diarrhea and vomiting.  Musculoskeletal: Negative for arthralgias and myalgias.  Skin: Negative for color change and rash.  Neurological: Negative for tremors, syncope and headaches.  Psychiatric/Behavioral: Negative for confusion and dysphoric mood.     Physical Exam Updated Vital Signs BP 126/86   Pulse 80   Resp 20   SpO2 98%   Physical Exam  Constitutional: He is oriented to person, place, and time. He appears well-developed and well-nourished.  HENT:  Head: Normocephalic and atraumatic.  Eyes: EOM are normal. Pupils are equal, round, and reactive to light.  Neck: Normal range of motion. Neck supple. No JVD present.  Cardiovascular: Normal rate and regular rhythm.  Exam reveals no gallop and no friction rub.   No murmur  heard. Pulmonary/Chest: No respiratory distress. He has no wheezes.  Abdominal: He exhibits no distension and no mass. There is no tenderness. There is no rebound and no guarding.  Musculoskeletal: Normal range of motion.  Neurological: He is alert and oriented to person, place, and time.  Skin: No rash noted. No pallor.  Psychiatric: He has a normal mood and affect. His behavior is normal.  Nursing note and vitals reviewed.    ED Treatments / Results  Labs (all labs ordered are listed, but only abnormal results are displayed) Labs Reviewed  BASIC METABOLIC PANEL - Abnormal; Notable for the following:       Result Value   Glucose, Bld 426 (*)    All other components within normal limits  CBC - Abnormal; Notable for the following:    WBC 12.9 (*)    RBC 4.15 (*)    Hemoglobin 12.4 (*)    HCT 37.6 (*)    All other components within normal limits  URINALYSIS, ROUTINE W REFLEX MICROSCOPIC - Abnormal; Notable for the following:    Color, Urine STRAW (*)    Glucose, UA >=500 (*)    Ketones, ur 5 (*)    Squamous Epithelial / LPF 0-5 (*)    All other components within normal limits  CBG MONITORING, ED - Abnormal; Notable for the  following:    Glucose-Capillary 413 (*)    All other components within normal limits  CBG MONITORING, ED - Abnormal; Notable for the following:    Glucose-Capillary 409 (*)    All other components within normal limits    EKG  EKG Interpretation None       Radiology No results found.  Procedures Procedures (including critical care time)  Medications Ordered in ED Medications  oxyCODONE-acetaminophen (PERCOCET/ROXICET) 5-325 MG per tablet 1 tablet (not administered)  sodium chloride 0.9 % bolus 1,000 mL (1,000 mLs Intravenous New Bag/Given 12/01/16 2136)  insulin aspart (novoLOG) injection 10 Units (10 Units Intravenous Given 12/01/16 2249)     Initial Impression / Assessment and Plan / ED Course  I have reviewed the triage vital signs and the nursing notes.  Pertinent labs & imaging results that were available during my care of the patient were reviewed by me and considered in my medical decision making (see chart for details).     34 yoM with hyperglycemia, not in DKA.  Will have him follow up with his PCP.  Also with some blurry vision on ROS.  Both eyes, doubt stroke, will have follow up with optho.   11:23 PM:  I have discussed the diagnosis/risks/treatment options with the patient and family and believe the pt to be eligible for discharge home to follow-up with PCP. We also discussed returning to the ED immediately if new or worsening sx occur. We discussed the sx which are most concerning (e.g., sudden worsening pain, fever, inability to tolerate by mouth) that necessitate immediate return. Medications administered to the patient during their visit and any new prescriptions provided to the patient are listed below.  Medications given during this visit Medications  oxyCODONE-acetaminophen (PERCOCET/ROXICET) 5-325 MG per tablet 1 tablet (not administered)  sodium chloride 0.9 % bolus 1,000 mL (1,000 mLs Intravenous New Bag/Given 12/01/16 2136)  insulin aspart (novoLOG)  injection 10 Units (10 Units Intravenous Given 12/01/16 2249)     The patient appears reasonably screen and/or stabilized for discharge and I doubt any other medical condition or other [facility] requiring further screening, evaluation, or treatment in the ED at  this time prior to discharge.    Final Clinical Impressions(s) / ED Diagnoses   Final diagnoses:  Hyperglycemia    New Prescriptions New Prescriptions   No medications on file     Deno Etienne, DO 12/01/16 2323

## 2016-12-01 NOTE — Discharge Instructions (Signed)
Discuss with your family doc.  Also call your eye doctor.

## 2016-12-01 NOTE — Telephone Encounter (Signed)
Called pt - stated he had a steroid injection yesterday which he had before but stated it only increased his BS to 200's. And his BS is 375 this morning and wants to know if he should be concerned or should be seen again.

## 2016-12-01 NOTE — ED Notes (Signed)
Dr. Tyrone Nine ( EDP ) notified on pt.'s elevated blood sugar. Nurse first notified.

## 2016-12-01 NOTE — Telephone Encounter (Signed)
Pt states fasting AM cbg at 7:30 was 375. Before the cortisone shot his blood sugar was 164 and he was not expecting this much of a rise because usually his blood sugar increases to 200s after cortisone. When he is not on cortisone his morning cbg is usually 160-170s. He denies abdominal pain or nausea and says he has no other concerns or complaints. Was driving to cardiac rehab at the time of this phone call and said that he would have them check his cbg when he gets there. Told him that his blood sugar may be higher than usual for the next 1-2 weeks. Told him to call us if he develops any symptoms of N/V or abdominal pain.

## 2016-12-01 NOTE — Telephone Encounter (Signed)
He said ok, but he is going to drink his juice, he states he drinks a lot of water but he "aint" going to give up his juice.

## 2016-12-01 NOTE — Telephone Encounter (Signed)
Pt calling says his blood sugars are up this morning 375 with fasting. Spoke with Ulis Rias and sharon about this to see if patient needed to come in. Advised to send not to triage

## 2016-12-01 NOTE — Telephone Encounter (Signed)
Received a call from hme, r/t pt's cbg this am, when I opened chart to write note I see this has been addressed this morning, I will add my note just in case: Fasting bs this am 375, felt fine, but alarmed At 0730 pt had breakfast of grits w/ butter, eggs and liver pudding and 8 oz orange juice 0800 took his meds 1016 recheck bs 386 rtc to [phone] I have now read the other notes from this am, pt did not tell me that he had spoken to others

## 2016-12-02 NOTE — Assessment & Plan Note (Signed)
Home medications include metformin 1000 mg BID, sitagliptin 100 mg, glipizide 10 mg qd, and canagliflozin 100 mg qg. Last A1c 10/2016 8.5. Had a steroid injection the day prior to this office visit and we received a phone call the day following this visit that his AM fasting glucose was 375. He denies nausea or abdominal pain and said he was feeling like his usual self.   A1c is not at goal. Reported that he wasn't taking metformin as prescribed (was taking 500 mg BID, not 1000mg  BID). He is on 4 oral medications and is resistant to starting insulin. There is room to increase his dose of glipizide (max daily dose 15mg ), sitagliptin (max daily dose 100 mg), and canagliflozin (max daily dose 300 mg) in the future. BP is at goal today. - Continue current regiment  -will need close monitoring of his CBG for the next 1-2 weeks, I anticipate that his glucose will gradually improve - continue atorvastatin 80 mg daily  - will need eye exam if there is not one in the records he brings to next visit.  -will need urine microalbumin/ creatinine

## 2016-12-03 ENCOUNTER — Observation Stay (HOSPITAL_COMMUNITY)
Admission: EM | Admit: 2016-12-03 | Discharge: 2016-12-05 | Disposition: A | Discharge status: Home / Self Care | Payer: Medicaid Other | Attending: Student in an Organized Health Care Education/Training Program | Admitting: Student in an Organized Health Care Education/Training Program

## 2016-12-03 ENCOUNTER — Other Ambulatory Visit (HOSPITAL_COMMUNITY): Payer: Self-pay

## 2016-12-03 ENCOUNTER — Ambulatory Visit (HOSPITAL_COMMUNITY)
Admission: EM | Admit: 2016-12-03 | Discharge: 2016-12-03 | Disposition: A | Discharge status: Nursing Home (Includes ICF) | Payer: Medicaid Other | Attending: Radiology | Admitting: Radiology

## 2016-12-03 ENCOUNTER — Encounter (HOSPITAL_COMMUNITY): Payer: Self-pay | Admitting: *Deleted

## 2016-12-03 ENCOUNTER — Other Ambulatory Visit: Payer: Self-pay

## 2016-12-03 ENCOUNTER — Ambulatory Visit (HOSPITAL_COMMUNITY): Payer: Medicaid Other

## 2016-12-03 ENCOUNTER — Emergency Department (HOSPITAL_COMMUNITY): Payer: Medicaid Other

## 2016-12-03 ENCOUNTER — Encounter (HOSPITAL_COMMUNITY): Payer: Self-pay | Admitting: Vascular Surgery

## 2016-12-03 DIAGNOSIS — Z7982 Long term (current) use of aspirin: Secondary | ICD-10-CM | POA: Insufficient documentation

## 2016-12-03 DIAGNOSIS — E1151 Type 2 diabetes mellitus with diabetic peripheral angiopathy without gangrene: Secondary | ICD-10-CM | POA: Diagnosis not present

## 2016-12-03 DIAGNOSIS — R05 Cough: Secondary | ICD-10-CM | POA: Diagnosis not present

## 2016-12-03 DIAGNOSIS — J101 Influenza due to other identified influenza virus with other respiratory manifestations: Secondary | ICD-10-CM | POA: Insufficient documentation

## 2016-12-03 DIAGNOSIS — Z803 Family history of malignant neoplasm of breast: Secondary | ICD-10-CM | POA: Insufficient documentation

## 2016-12-03 DIAGNOSIS — Z886 Allergy status to analgesic agent status: Secondary | ICD-10-CM | POA: Insufficient documentation

## 2016-12-03 DIAGNOSIS — R059 Cough, unspecified: Secondary | ICD-10-CM

## 2016-12-03 DIAGNOSIS — E66813 Obesity, class 3: Secondary | ICD-10-CM | POA: Diagnosis present

## 2016-12-03 DIAGNOSIS — G8929 Other chronic pain: Secondary | ICD-10-CM | POA: Diagnosis not present

## 2016-12-03 DIAGNOSIS — G4733 Obstructive sleep apnea (adult) (pediatric): Secondary | ICD-10-CM | POA: Insufficient documentation

## 2016-12-03 DIAGNOSIS — F329 Major depressive disorder, single episode, unspecified: Secondary | ICD-10-CM | POA: Diagnosis not present

## 2016-12-03 DIAGNOSIS — Z79899 Other long term (current) drug therapy: Secondary | ICD-10-CM | POA: Diagnosis not present

## 2016-12-03 DIAGNOSIS — Z8249 Family history of ischemic heart disease and other diseases of the circulatory system: Secondary | ICD-10-CM | POA: Insufficient documentation

## 2016-12-03 DIAGNOSIS — Z7902 Long term (current) use of antithrombotics/antiplatelets: Secondary | ICD-10-CM | POA: Diagnosis not present

## 2016-12-03 DIAGNOSIS — I2510 Atherosclerotic heart disease of native coronary artery without angina pectoris: Secondary | ICD-10-CM | POA: Diagnosis not present

## 2016-12-03 DIAGNOSIS — R600 Localized edema: Secondary | ICD-10-CM | POA: Insufficient documentation

## 2016-12-03 DIAGNOSIS — Z955 Presence of coronary angioplasty implant and graft: Secondary | ICD-10-CM | POA: Insufficient documentation

## 2016-12-03 DIAGNOSIS — G4700 Insomnia, unspecified: Secondary | ICD-10-CM | POA: Insufficient documentation

## 2016-12-03 DIAGNOSIS — E876 Hypokalemia: Secondary | ICD-10-CM | POA: Insufficient documentation

## 2016-12-03 DIAGNOSIS — E872 Acidosis, unspecified: Secondary | ICD-10-CM | POA: Diagnosis present

## 2016-12-03 DIAGNOSIS — R6889 Other general symptoms and signs: Secondary | ICD-10-CM

## 2016-12-03 DIAGNOSIS — K219 Gastro-esophageal reflux disease without esophagitis: Secondary | ICD-10-CM | POA: Insufficient documentation

## 2016-12-03 DIAGNOSIS — E1159 Type 2 diabetes mellitus with other circulatory complications: Secondary | ICD-10-CM | POA: Diagnosis present

## 2016-12-03 DIAGNOSIS — M1990 Unspecified osteoarthritis, unspecified site: Secondary | ICD-10-CM | POA: Insufficient documentation

## 2016-12-03 DIAGNOSIS — Z7984 Long term (current) use of oral hypoglycemic drugs: Secondary | ICD-10-CM | POA: Diagnosis not present

## 2016-12-03 DIAGNOSIS — E785 Hyperlipidemia, unspecified: Secondary | ICD-10-CM | POA: Diagnosis not present

## 2016-12-03 DIAGNOSIS — Z6841 Body Mass Index (BMI) 40.0 and over, adult: Secondary | ICD-10-CM | POA: Insufficient documentation

## 2016-12-03 DIAGNOSIS — Z833 Family history of diabetes mellitus: Secondary | ICD-10-CM | POA: Insufficient documentation

## 2016-12-03 DIAGNOSIS — K589 Irritable bowel syndrome without diarrhea: Secondary | ICD-10-CM | POA: Diagnosis not present

## 2016-12-03 DIAGNOSIS — Z8711 Personal history of peptic ulcer disease: Secondary | ICD-10-CM | POA: Diagnosis not present

## 2016-12-03 DIAGNOSIS — I10 Essential (primary) hypertension: Secondary | ICD-10-CM | POA: Diagnosis present

## 2016-12-03 DIAGNOSIS — J111 Influenza due to unidentified influenza virus with other respiratory manifestations: Principal | ICD-10-CM | POA: Insufficient documentation

## 2016-12-03 DIAGNOSIS — Z91018 Allergy to other foods: Secondary | ICD-10-CM | POA: Insufficient documentation

## 2016-12-03 LAB — CBC
HCT: 36.4 % — ABNORMAL LOW (ref 39.0–52.0)
Hemoglobin: 12.1 g/dL — ABNORMAL LOW (ref 13.0–17.0)
MCH: 30 pg (ref 26.0–34.0)
MCHC: 33.2 g/dL (ref 30.0–36.0)
MCV: 90.1 fL (ref 78.0–100.0)
Platelets: 220 10*3/uL (ref 150–400)
RBC: 4.04 MIL/uL — ABNORMAL LOW (ref 4.22–5.81)
RDW: 14 % (ref 11.5–15.5)
WBC: 8.7 10*3/uL (ref 4.0–10.5)

## 2016-12-03 LAB — BASIC METABOLIC PANEL
Anion gap: 12 (ref 5–15)
BUN: 13 mg/dL (ref 6–20)
CO2: 24 mmol/L (ref 22–32)
Calcium: 9.2 mg/dL (ref 8.9–10.3)
Chloride: 101 mmol/L (ref 101–111)
Creatinine, Ser: 0.79 mg/dL (ref 0.61–1.24)
GFR calc Af Amer: >60 mL/min (ref >60)
GFR calc non Af Amer: >60 mL/min (ref >60)
Glucose, Bld: 186 mg/dL — ABNORMAL HIGH (ref 65–99)
Potassium: 3.2 mmol/L — ABNORMAL LOW (ref 3.5–5.1)
Sodium: 137 mmol/L (ref 135–145)

## 2016-12-03 LAB — I-STAT CG4 LACTIC ACID, ED: Lactic Acid, Venous: 3.02 mmol/L (ref 0.5–1.9)

## 2016-12-03 LAB — BRAIN NATRIURETIC PEPTIDE: B Natriuretic Peptide: 27.3 pg/mL (ref 0.0–100.0)

## 2016-12-03 LAB — I-STAT TROPONIN, ED: Troponin i, poc: 0 ng/mL (ref 0.00–0.08)

## 2016-12-03 MED ORDER — IPRATROPIUM-ALBUTEROL 0.5-2.5 (3) MG/3ML IN SOLN
RESPIRATORY_TRACT | Status: AC
  Filled 2016-12-03: qty 3

## 2016-12-03 MED ORDER — SODIUM CHLORIDE 0.9 % IV BOLUS (SEPSIS)
1000.0000 mL | Freq: Once | INTRAVENOUS | Status: AC
Start: 2016-12-03 — End: 2016-12-04
  Administered 2016-12-03: 1000 mL via INTRAVENOUS

## 2016-12-03 MED ORDER — IPRATROPIUM-ALBUTEROL 0.5-2.5 (3) MG/3ML IN SOLN
3.0000 mL | Freq: Once | RESPIRATORY_TRACT | Status: AC
  Administered 2016-12-03: 3 mL via RESPIRATORY_TRACT

## 2016-12-03 MED ORDER — HYDROCODONE-HOMATROPINE 5-1.5 MG/5ML PO SYRP
5.0000 mL | ORAL_SOLUTION | Freq: Once | ORAL | Status: AC
  Administered 2016-12-03: 5 mL via ORAL
  Filled 2016-12-03: qty 5

## 2016-12-03 NOTE — ED Triage Notes (Signed)
Pt reports to the ED for eval of cough, body aches, light headedness, N/V, chest pressure, and SOB. Pt has hx of CHF and states that he feels like he has fluid on his chest. Was seen at [facility] and given a breathing tx which he states made his symptoms worse. CP is generalized and radiates into his stomach. Pt also reports some BLE swelling as well but he has this at baseline. Pt reports thick, yellow sputum with the cough. Denies any fevers or chills.

## 2016-12-03 NOTE — ED Provider Notes (Signed)
CSN: [place]:[id number]     Arrival date & time 12/03/16  1654 History   First MD Initiated Contact with Patient 12/03/16 1901     Chief Complaint  Patient presents with  . Cough  . Emesis   (Consider location/radiation/quality/duration/timing/severity/associated sxs/prior Treatment) 56 y.o. male presents with productive cough  X 2 days. Patient states that cough began when he was discharged from the ED for hyperglycemia related to a steroid injection. Patient reports a productive cough that is yellow green in nature. Patient denies any fevers but endorses upper epigastric pain that radiates up to mid sternum with productive  Cough and dizziness.  Condition is acute in nature. Condition is made better by nothing. Condition is made worse by nothing. Patient denies any treatment prior to there arrival at this facility. Patient denies any radiation of pain or pain with palpation. Patient has a history of diabetes, and CHF. Patient has had 2 cardiac catherizations       Past Medical History:  Diagnosis Date  . Arthritis   . Back pain   . CAD (coronary artery disease)    a. s/p DES to LAD in 05/2016  . Hyperlipidemia   . Hypertension   . IBS (irritable bowel syndrome)   . OSA (obstructive sleep apnea)   . Pancreatitis   . PUD (peptic ulcer disease)   . Type 2 diabetes mellitus ([place])    Past Surgical History:  Procedure Laterality Date  . CARDIAC CATHETERIZATION N/A 05/31/2016   Procedure: Left Heart Cath and Coronary Angiography;  Surgeon: Peter M Martinique, MD;  Location: [facility] CV LAB;  Service: Cardiovascular;  Laterality: N/A;  . CARDIAC CATHETERIZATION N/A 05/31/2016   Procedure: Intravascular Pressure Wire/FFR Study;  Surgeon: Peter M Martinique, MD;  Location: [place] [facility] LAB;  Service: Cardiovascular;  Laterality: N/A;  . CARDIAC CATHETERIZATION N/A 05/31/2016   Procedure: Coronary Stent Intervention;  Surgeon: Peter M Martinique, MD;  Location: [facility] LAB;  Service: Cardiovascular;   Laterality: N/A;  . LEFT HEART CATHETERIZATION WITH CORONARY ANGIOGRAM N/A 02/03/2014   Procedure: LEFT HEART CATHETERIZATION WITH CORONARY ANGIOGRAM;  Surgeon: Pixie Casino, MD;  Location: [facility] CATH LAB;  Service: Cardiovascular;  Laterality: N/A;  . left leg stent      Family History  Problem Relation Age of Onset  . Cancer Father   . Hypertension Mother   . Diabetes Mother   . Breast cancer Mother   . Hypertension Brother   . Diabetes Brother   . Hypertension Sister   . Diabetes Sister    Social History  Substance Use Topics  . Smoking status: Never Smoker  . Smokeless tobacco: Never Used  . Alcohol use No    Review of Systems  Constitutional: Negative for chills and fever.  HENT: Negative for ear pain and sore throat.   Eyes: Negative for pain and visual disturbance.  Respiratory: Positive for cough ( productive yellow green in nature. ). Negative for shortness of breath. Choking:  productive yellow green in nature.    Cardiovascular: Negative for chest pain and palpitations.  Gastrointestinal: Negative for abdominal pain and vomiting.  Genitourinary: Negative for dysuria and hematuria.  Musculoskeletal: Negative for arthralgias and back pain.  Skin: Negative for color change and rash.  Neurological: Positive for dizziness. Negative for seizures, syncope and light-headedness.  All other systems reviewed and are negative.   Allergies  Coconut oil and Ibuprofen  Home Medications   Prior to Admission medications   Medication Sig Start  Date End Date Taking? Authorizing Provider  aspirin 81 MG chewable tablet Chew 1 tablet (81 mg total) by mouth daily. 06/01/16  Yes Elwin Mocha, MD  atorvastatin (LIPITOR) 80 MG tablet Take 1 tablet (80 mg total) by mouth daily. 01/06/16  Yes Lelon Perla, MD  canagliflozin (INVOKANA) 100 MG TABS tablet Take 1 tablet (100 mg total) by mouth daily before breakfast. 12/01/16  Yes Ledell Noss, MD  carvedilol (COREG) 25 MG tablet Take 25 mg  by mouth 2 (two) times daily with a meal.   Yes Historical Provider, MD  clopidogrel (PLAVIX) 75 MG tablet Take 75 mg by mouth daily.   Yes Historical Provider, MD  famotidine (PEPCID) 20 MG tablet Take 20 mg by mouth 2 (two) times daily.   Yes Historical Provider, MD  furosemide (LASIX) 40 MG tablet Take 40 mg by mouth 2 (two) times daily.   Yes Historical Provider, MD  gabapentin (NEURONTIN) 300 MG capsule Take 300 mg by mouth 3 (three) times daily. Take 1-4 tablets three times daily   Yes Historical Provider, MD  glipiZIDE (GLUCOTROL) 10 MG tablet Take 1 tablet by mouth 2 (two) times daily. 11/13/15  Yes Historical Provider, MD  hydrALAZINE (APRESOLINE) 50 MG tablet Take 50 mg by mouth 2 (two) times daily.   Yes Historical Provider, MD  isosorbide dinitrate (ISORDIL) 30 MG tablet Take 1 tablet (30 mg total) by mouth 2 (two) times daily. 11/22/16  Yes Erma Heritage, PA  metFORMIN (GLUCOPHAGE) 1000 MG tablet Take 1,000 mg by mouth 2 (two) times daily with a meal.   Yes Historical Provider, MD  methocarbamol (ROBAXIN) 750 MG tablet Take 750 mg by mouth 3 (three) times daily as needed for muscle spasms.  10/12/16  Yes Historical Provider, MD  pantoprazole (PROTONIX) 20 MG tablet Take 20 mg by mouth daily.  10/04/16  Yes Historical Provider, MD  potassium chloride SA (K-DUR,KLOR-CON) 20 MEQ tablet Take 20 mEq by mouth daily.   Yes Historical Provider, MD  sertraline (ZOLOFT) 50 MG tablet Take 50 mg by mouth daily.   Yes Historical Provider, MD  sitaGLIPtin (JANUVIA) 100 MG tablet Take 100 mg by mouth daily.   Yes Historical Provider, MD  traZODone (DESYREL) 50 MG tablet Take 50 mg by mouth at bedtime.   Yes Historical Provider, MD  diclofenac sodium (VOLTAREN) 1 % GEL Apply 4 g topically 4 (four) times daily. 11/30/16   Ledell Noss, MD   Meds Ordered and Administered this Visit   Medications  ipratropium-albuterol (DUONEB) 0.5-2.5 (3) MG/3ML nebulizer solution 3 mL (3 mLs Nebulization Given 12/03/16  1913)    BP 148/74   Pulse 93   Temp 98.4 F (36.9 C) (Oral)   Resp 18   SpO2 95%  No data found.   Physical Exam  Constitutional: He is oriented to person, place, and time. He appears well-developed and well-nourished.  HENT:  Head: Normocephalic.  Neck: Normal range of motion.  Pulmonary/Chest: Effort normal. He has wheezes. He exhibits no tenderness.  Musculoskeletal: Normal range of motion.  Neurological: He is alert and oriented to person, place, and time.  Skin: Skin is dry.  Psychiatric: He has a normal mood and affect.  Nursing note and vitals reviewed.   Urgent Care Course     Procedures (including critical care time)  Labs Review Labs Reviewed - No data to display  Imaging Review No results found.   Patient report worsening symptoms after administration of albuterol nebulizer.   Patient  sent to the ED for further evaluation and possible intervention     MDM   1. Cough        Jacqualine Mau, NP 12/03/16 1936

## 2016-12-03 NOTE — ED Triage Notes (Signed)
Critical lab called from lab.  Tilda Burrow RN charge nurse made aware

## 2016-12-03 NOTE — ED Triage Notes (Signed)
C/O cough, body aches x couple days without fever.  Emesis x 2 today.  Able to keep down PO fluids, no food.

## 2016-12-04 ENCOUNTER — Encounter (HOSPITAL_COMMUNITY): Payer: Self-pay | Admitting: Internal Medicine

## 2016-12-04 DIAGNOSIS — Z803 Family history of malignant neoplasm of breast: Secondary | ICD-10-CM

## 2016-12-04 DIAGNOSIS — K219 Gastro-esophageal reflux disease without esophagitis: Secondary | ICD-10-CM | POA: Diagnosis not present

## 2016-12-04 DIAGNOSIS — E119 Type 2 diabetes mellitus without complications: Secondary | ICD-10-CM | POA: Diagnosis not present

## 2016-12-04 DIAGNOSIS — I10 Essential (primary) hypertension: Secondary | ICD-10-CM | POA: Diagnosis not present

## 2016-12-04 DIAGNOSIS — G4700 Insomnia, unspecified: Secondary | ICD-10-CM

## 2016-12-04 DIAGNOSIS — G4733 Obstructive sleep apnea (adult) (pediatric): Secondary | ICD-10-CM

## 2016-12-04 DIAGNOSIS — J101 Influenza due to other identified influenza virus with other respiratory manifestations: Secondary | ICD-10-CM | POA: Diagnosis not present

## 2016-12-04 DIAGNOSIS — R6889 Other general symptoms and signs: Secondary | ICD-10-CM | POA: Insufficient documentation

## 2016-12-04 DIAGNOSIS — R600 Localized edema: Secondary | ICD-10-CM | POA: Diagnosis not present

## 2016-12-04 DIAGNOSIS — Z8249 Family history of ischemic heart disease and other diseases of the circulatory system: Secondary | ICD-10-CM

## 2016-12-04 DIAGNOSIS — Z9989 Dependence on other enabling machines and devices: Secondary | ICD-10-CM

## 2016-12-04 DIAGNOSIS — K279 Peptic ulcer, site unspecified, unspecified as acute or chronic, without hemorrhage or perforation: Secondary | ICD-10-CM

## 2016-12-04 DIAGNOSIS — E669 Obesity, unspecified: Secondary | ICD-10-CM

## 2016-12-04 DIAGNOSIS — Z833 Family history of diabetes mellitus: Secondary | ICD-10-CM

## 2016-12-04 DIAGNOSIS — F329 Major depressive disorder, single episode, unspecified: Secondary | ICD-10-CM

## 2016-12-04 DIAGNOSIS — E872 Acidosis: Secondary | ICD-10-CM

## 2016-12-04 LAB — INFLUENZA PANEL BY PCR (TYPE A & B)
Influenza A By PCR: NEGATIVE
Influenza B By PCR: POSITIVE — AB

## 2016-12-04 LAB — CBC WITH DIFFERENTIAL/PLATELET
Basophils Absolute: 0 10*3/uL (ref 0.0–0.1)
Basophils Relative: 0 %
Eosinophils Absolute: 0 10*3/uL (ref 0.0–0.7)
Eosinophils Relative: 0 %
HCT: 36.7 % — ABNORMAL LOW (ref 39.0–52.0)
Hemoglobin: 11.9 g/dL — ABNORMAL LOW (ref 13.0–17.0)
Lymphocytes Relative: 16 %
Lymphs Abs: 1.2 10*3/uL (ref 0.7–4.0)
MCH: 29.3 pg (ref 26.0–34.0)
MCHC: 32.4 g/dL (ref 30.0–36.0)
MCV: 90.4 fL (ref 78.0–100.0)
Monocytes Absolute: 0.8 10*3/uL (ref 0.1–1.0)
Monocytes Relative: 11 %
Neutro Abs: 5.5 10*3/uL (ref 1.7–7.7)
Neutrophils Relative %: 73 %
Platelets: 201 10*3/uL (ref 150–400)
RBC: 4.06 MIL/uL — ABNORMAL LOW (ref 4.22–5.81)
RDW: 13.9 % (ref 11.5–15.5)
WBC: 7.6 10*3/uL (ref 4.0–10.5)

## 2016-12-04 LAB — LACTIC ACID, PLASMA
Lactic Acid, Venous: 2.8 mmol/L (ref 0.5–1.9)
Lactic Acid, Venous: 2.6 mmol/L (ref 0.5–1.9)
Lactic Acid, Venous: 2.6 mmol/L (ref 0.5–1.9)
Lactic Acid, Venous: 3 mmol/L (ref 0.5–1.9)

## 2016-12-04 LAB — CBC
HCT: 36.1 % — ABNORMAL LOW (ref 39.0–52.0)
Hemoglobin: 11.7 g/dL — ABNORMAL LOW (ref 13.0–17.0)
MCH: 29.5 pg (ref 26.0–34.0)
MCHC: 32.4 g/dL (ref 30.0–36.0)
MCV: 90.9 fL (ref 78.0–100.0)
Platelets: 197 10*3/uL (ref 150–400)
RBC: 3.97 MIL/uL — ABNORMAL LOW (ref 4.22–5.81)
RDW: 14 % (ref 11.5–15.5)
WBC: 7 10*3/uL (ref 4.0–10.5)

## 2016-12-04 LAB — BASIC METABOLIC PANEL
Anion gap: 14 (ref 5–15)
BUN: 11 mg/dL (ref 6–20)
CO2: 21 mmol/L — ABNORMAL LOW (ref 22–32)
Calcium: 8.5 mg/dL — ABNORMAL LOW (ref 8.9–10.3)
Chloride: 102 mmol/L (ref 101–111)
Creatinine, Ser: 0.83 mg/dL (ref 0.61–1.24)
GFR calc Af Amer: >60 mL/min (ref >60)
GFR calc non Af Amer: >60 mL/min (ref >60)
Glucose, Bld: 176 mg/dL — ABNORMAL HIGH (ref 65–99)
Potassium: 3.1 mmol/L — ABNORMAL LOW (ref 3.5–5.1)
Sodium: 137 mmol/L (ref 135–145)

## 2016-12-04 LAB — I-STAT CG4 LACTIC ACID, ED: Lactic Acid, Venous: 4.43 mmol/L (ref 0.5–1.9)

## 2016-12-04 LAB — HIV ANTIBODY (ROUTINE TESTING W REFLEX): HIV Screen 4th Generation wRfx: NONREACTIVE

## 2016-12-04 LAB — GLUCOSE, CAPILLARY
Glucose-Capillary: 179 mg/dL — ABNORMAL HIGH (ref 65–99)
Glucose-Capillary: 222 mg/dL — ABNORMAL HIGH (ref 65–99)
Glucose-Capillary: 199 mg/dL — ABNORMAL HIGH (ref 65–99)
Glucose-Capillary: 244 mg/dL — ABNORMAL HIGH (ref 65–99)

## 2016-12-04 MED ORDER — PANTOPRAZOLE SODIUM 20 MG PO TBEC
20.0000 mg | DELAYED_RELEASE_TABLET | Freq: Every day | ORAL | Status: DC
  Administered 2016-12-04 – 2016-12-05 (×2): 20 mg via ORAL
  Filled 2016-12-04 (×2): qty 1

## 2016-12-04 MED ORDER — GUAIFENESIN-CODEINE 100-10 MG/5ML PO SOLN
5.0000 mL | ORAL | Status: DC | PRN
  Filled 2016-12-04: qty 5

## 2016-12-04 MED ORDER — ONDANSETRON HCL 4 MG PO TABS: 4.0000 mg | ORAL_TABLET | Freq: Four times a day (QID) | ORAL | Status: DC | PRN

## 2016-12-04 MED ORDER — TRAZODONE HCL 50 MG PO TABS
50.0000 mg | ORAL_TABLET | Freq: Every day | ORAL | Status: DC
  Administered 2016-12-04: 50 mg via ORAL
  Filled 2016-12-04: qty 1

## 2016-12-04 MED ORDER — ASPIRIN 81 MG PO CHEW
81.0000 mg | CHEWABLE_TABLET | Freq: Every day | ORAL | Status: DC
  Administered 2016-12-04 – 2016-12-05 (×2): 81 mg via ORAL
  Filled 2016-12-04 (×2): qty 1

## 2016-12-04 MED ORDER — ENOXAPARIN SODIUM 80 MG/0.8ML ~~LOC~~ SOLN
70.0000 mg | SUBCUTANEOUS | Status: DC
  Administered 2016-12-04 – 2016-12-05 (×2): 70 mg via SUBCUTANEOUS
  Filled 2016-12-04 (×2): qty 0.8

## 2016-12-04 MED ORDER — POTASSIUM CHLORIDE CRYS ER 20 MEQ PO TBCR
20.0000 meq | EXTENDED_RELEASE_TABLET | Freq: Every day | ORAL | Status: DC
  Administered 2016-12-04 – 2016-12-05 (×2): 20 meq via ORAL
  Filled 2016-12-04 (×2): qty 1

## 2016-12-04 MED ORDER — CARVEDILOL 25 MG PO TABS
25.0000 mg | ORAL_TABLET | Freq: Two times a day (BID) | ORAL | Status: DC
  Administered 2016-12-04 – 2016-12-05 (×3): 25 mg via ORAL
  Filled 2016-12-04 (×3): qty 1

## 2016-12-04 MED ORDER — ACETAMINOPHEN 325 MG PO TABS
650.0000 mg | ORAL_TABLET | Freq: Four times a day (QID) | ORAL | Status: DC | PRN
Start: 2016-12-04 — End: 2016-12-05

## 2016-12-04 MED ORDER — INSULIN ASPART 100 UNIT/ML ~~LOC~~ SOLN
0.0000 [IU] | Freq: Every day | SUBCUTANEOUS | Status: DC
  Administered 2016-12-04: 2 [IU] via SUBCUTANEOUS

## 2016-12-04 MED ORDER — ONDANSETRON HCL 4 MG/2ML IJ SOLN: 4.0000 mg | Freq: Four times a day (QID) | INTRAMUSCULAR | Status: DC | PRN

## 2016-12-04 MED ORDER — ISOSORBIDE DINITRATE 30 MG PO TABS
30.0000 mg | ORAL_TABLET | Freq: Two times a day (BID) | ORAL | Status: DC
  Administered 2016-12-04 – 2016-12-05 (×3): 30 mg via ORAL
  Filled 2016-12-04 (×4): qty 1

## 2016-12-04 MED ORDER — VANCOMYCIN HCL IN DEXTROSE 1-5 GM/200ML-% IV SOLN: 1000.0000 mg | Freq: Once | INTRAVENOUS | Status: DC

## 2016-12-04 MED ORDER — SODIUM CHLORIDE 0.9 % IV BOLUS (SEPSIS)
500.0000 mL | Freq: Once | INTRAVENOUS | Status: AC
  Administered 2016-12-04: 500 mL via INTRAVENOUS

## 2016-12-04 MED ORDER — POLYETHYLENE GLYCOL 3350 17 G PO PACK: 17.0000 g | PACK | Freq: Every day | ORAL | Status: DC | PRN

## 2016-12-04 MED ORDER — MENTHOL 3 MG MT LOZG: 1.0000 | LOZENGE | OROMUCOSAL | Status: DC | PRN

## 2016-12-04 MED ORDER — SODIUM CHLORIDE 0.9 % IV BOLUS (SEPSIS)
1000.0000 mL | Freq: Once | INTRAVENOUS | Status: AC
  Administered 2016-12-04: 1000 mL via INTRAVENOUS

## 2016-12-04 MED ORDER — METHOCARBAMOL 750 MG PO TABS
750.0000 mg | ORAL_TABLET | Freq: Three times a day (TID) | ORAL | Status: DC | PRN
  Administered 2016-12-04: 750 mg via ORAL
  Filled 2016-12-04: qty 1

## 2016-12-04 MED ORDER — FAMOTIDINE 20 MG PO TABS
20.0000 mg | ORAL_TABLET | Freq: Two times a day (BID) | ORAL | Status: DC
  Administered 2016-12-04 – 2016-12-05 (×3): 20 mg via ORAL
  Filled 2016-12-04 (×3): qty 1

## 2016-12-04 MED ORDER — HYDRALAZINE HCL 50 MG PO TABS
50.0000 mg | ORAL_TABLET | Freq: Two times a day (BID) | ORAL | Status: DC
  Administered 2016-12-04 – 2016-12-05 (×3): 50 mg via ORAL
  Filled 2016-12-04 (×3): qty 1

## 2016-12-04 MED ORDER — SERTRALINE HCL 50 MG PO TABS
50.0000 mg | ORAL_TABLET | Freq: Every day | ORAL | Status: DC
  Administered 2016-12-04 – 2016-12-05 (×2): 50 mg via ORAL
  Filled 2016-12-04 (×2): qty 1

## 2016-12-04 MED ORDER — FUROSEMIDE 40 MG PO TABS
40.0000 mg | ORAL_TABLET | Freq: Two times a day (BID) | ORAL | Status: DC
  Administered 2016-12-04 – 2016-12-05 (×3): 40 mg via ORAL
  Filled 2016-12-04 (×3): qty 1

## 2016-12-04 MED ORDER — ACETAMINOPHEN 650 MG RE SUPP: 650.0000 mg | Freq: Four times a day (QID) | RECTAL | Status: DC | PRN

## 2016-12-04 MED ORDER — CLOPIDOGREL BISULFATE 75 MG PO TABS
75.0000 mg | ORAL_TABLET | Freq: Every day | ORAL | Status: DC
  Administered 2016-12-04 – 2016-12-05 (×2): 75 mg via ORAL
  Filled 2016-12-04 (×2): qty 1

## 2016-12-04 MED ORDER — HYDROCODONE-ACETAMINOPHEN 5-325 MG PO TABS
1.0000 | ORAL_TABLET | Freq: Four times a day (QID) | ORAL | Status: DC | PRN
  Administered 2016-12-04 – 2016-12-05 (×5): 2 via ORAL
  Filled 2016-12-04 (×5): qty 2

## 2016-12-04 MED ORDER — SODIUM CHLORIDE 0.9 % IV SOLN
2500.0000 mg | Freq: Once | INTRAVENOUS | Status: DC
  Filled 2016-12-04: qty 2500

## 2016-12-04 MED ORDER — ATORVASTATIN CALCIUM 80 MG PO TABS
80.0000 mg | ORAL_TABLET | Freq: Every day | ORAL | Status: DC
  Administered 2016-12-04: 80 mg via ORAL
  Filled 2016-12-04: qty 1

## 2016-12-04 MED ORDER — GABAPENTIN 300 MG PO CAPS
600.0000 mg | ORAL_CAPSULE | Freq: Four times a day (QID) | ORAL | Status: DC
  Administered 2016-12-04 – 2016-12-05 (×5): 600 mg via ORAL
  Filled 2016-12-04 (×5): qty 2

## 2016-12-04 MED ORDER — SODIUM CHLORIDE 0.9 % IV SOLN
INTRAVENOUS | Status: AC
  Administered 2016-12-04: 11:00:00 via INTRAVENOUS

## 2016-12-04 MED ORDER — PIPERACILLIN-TAZOBACTAM 3.375 G IVPB 30 MIN
3.3750 g | Freq: Once | INTRAVENOUS | Status: AC
  Administered 2016-12-04: 3.375 g via INTRAVENOUS
  Filled 2016-12-04: qty 50

## 2016-12-04 MED ORDER — POTASSIUM CHLORIDE CRYS ER 20 MEQ PO TBCR
40.0000 meq | EXTENDED_RELEASE_TABLET | Freq: Once | ORAL | Status: AC
  Administered 2016-12-04: 40 meq via ORAL
  Filled 2016-12-04: qty 2

## 2016-12-04 MED ORDER — OSELTAMIVIR PHOSPHATE 75 MG PO CAPS
75.0000 mg | ORAL_CAPSULE | Freq: Two times a day (BID) | ORAL | Status: DC
  Administered 2016-12-04 – 2016-12-05 (×4): 75 mg via ORAL
  Filled 2016-12-04 (×4): qty 1

## 2016-12-04 MED ORDER — SODIUM CHLORIDE 0.9 % IV BOLUS (SEPSIS): 1000.0000 mL | Freq: Once | INTRAVENOUS | Status: DC

## 2016-12-04 MED ORDER — INSULIN ASPART 100 UNIT/ML ~~LOC~~ SOLN
0.0000 [IU] | Freq: Three times a day (TID) | SUBCUTANEOUS | Status: DC
  Administered 2016-12-04: 5 [IU] via SUBCUTANEOUS
  Administered 2016-12-04 (×2): 3 [IU] via SUBCUTANEOUS
  Administered 2016-12-05: 8 [IU] via SUBCUTANEOUS
  Administered 2016-12-05: 5 [IU] via SUBCUTANEOUS

## 2016-12-04 NOTE — H&P (Signed)
Date: 12/04/2016               Patient Name:  [name] MRN: [id number]  DOB: 12/31/1960 Age / Sex: 56 y.o., male   PCP: Pcp Not In System         Medical Service: Internal Medicine Teaching Service         Attending Physician: Dr. Gilles Chiquito    First Contact: Dr. Alphonzo Grieve Pager: [phone]  Second Contact: Dr. Maryellen Pile Pager: [phone]       After Hours (After 5p/  First Contact Pager: [phone]  weekends / holidays): Second Contact Pager: [phone]   Chief Complaint: Cough  History of Present Illness: [name] is a 56 y.o. gentleman with PMH T2DM, HTN, HLD, morbid obesity, OSA, PUD, osteoarthritis, CAD (s/p DES to CAD 05/2016) who presents for 1 week of worsening cough productive of yellow sputum. He reports subjective dyspnea and wheezing, and fatigue and myalgias that worsened today. He denies known sick contacts but lives in an ALF due to arthritis and falls. He endorses intermittent chills but no fever. He has developed aching pain across his chest that has gradually worsened with his persistent cough. He reports He has been using Robitussin-DM cough syrup with decreasing effectiveness. He developed nausea and vomiting today after trying a new OTC medication (Coricidin HBP) and was generally feeling worse and so presented first to an urgent care. They attempted a breathing treatment but he could not tolerate this due to increased coughing and was sent to the ED. He denies pharyngitis, rhinorrhea, sinus pain, or appetite loss.   In the ED, vitals 99.7 F, HR 92, RR 20, BP 152/79, SpO2 92% on RA. CXR with no acute findings, stable cardiomegaly. EKG NSR. Labs remarkable for initial lactic acid 3.02, which trended up to 4.43 four hours later. Otherwise CBC, BMP, BNP, troponin unremarkable. He received IV Vancomycin, Zosyn, 2x 1L NS bolus, and Tamiflu in the ED. IMTS contacted for admission.    Meds:  Current Meds  Medication Sig  . aspirin 81 MG chewable tablet  Chew 1 tablet (81 mg total) by mouth daily.  Marland Kitchen atorvastatin (LIPITOR) 80 MG tablet Take 1 tablet (80 mg total) by mouth daily.  . canagliflozin (INVOKANA) 100 MG TABS tablet Take 1 tablet (100 mg total) by mouth daily before breakfast.  . carvedilol (COREG) 25 MG tablet Take 25 mg by mouth 2 (two) times daily with a meal.  . clopidogrel (PLAVIX) 75 MG tablet Take 75 mg by mouth daily.  . famotidine (PEPCID) 20 MG tablet Take 20 mg by mouth 2 (two) times daily.  . furosemide (LASIX) 40 MG tablet Take 40 mg by mouth 2 (two) times daily.  Marland Kitchen gabapentin (NEURONTIN) 300 MG capsule Take 600 mg by mouth 4 (four) times daily.   Marland Kitchen glipiZIDE (GLUCOTROL) 10 MG tablet Take 1 tablet by mouth 2 (two) times daily.  . hydrALAZINE (APRESOLINE) 50 MG tablet Take 50 mg by mouth 2 (two) times daily.  . isosorbide dinitrate (ISORDIL) 30 MG tablet Take 1 tablet (30 mg total) by mouth 2 (two) times daily.  . metFORMIN (GLUCOPHAGE) 1000 MG tablet Take 1,000 mg by mouth 2 (two) times daily with a meal.  . methocarbamol (ROBAXIN) 750 MG tablet Take 750 mg by mouth 3 (three) times daily as needed for muscle spasms.   . pantoprazole (PROTONIX) 20 MG tablet Take 20 mg by mouth daily.   . potassium chloride SA (K-DUR,KLOR-CON) 20  MEQ tablet Take 20 mEq by mouth daily.  . sertraline (ZOLOFT) 50 MG tablet Take 50 mg by mouth daily.  . sitaGLIPtin (JANUVIA) 100 MG tablet Take 100 mg by mouth daily.  . traZODone (DESYREL) 50 MG tablet Take 50 mg by mouth at bedtime.   Hydrocodone-Acetaminophen 7.5-325 mg BID PRN via pain clinic provider in [address], [place]  Allergies: Allergies as of 12/03/2016 - Review Complete 12/03/2016  Allergen Reaction Noted  . Coconut oil Hives 03/09/2016  . Ibuprofen Other (See Comments) 01/29/2014   Past Medical History:  Diagnosis Date  . Arthritis   . Back pain   . CAD (coronary artery disease)    a. s/p DES to LAD in 05/2016  . Hyperlipidemia   . Hypertension   . IBS (irritable bowel  syndrome)   . OSA (obstructive sleep apnea)   . Pancreatitis   . PUD (peptic ulcer disease)   . Type 2 diabetes mellitus (HCC)     Family History:  Family History  Problem Relation Age of Onset  . Cancer Father   . Hypertension Mother   . Diabetes Mother   . Breast cancer Mother   . Hypertension Brother   . Diabetes Brother   . Hypertension Sister   . Diabetes Sister     Social History:  Social History   Social History  . Marital status: Single    Spouse name: N/A  . Number of children: 3  . Years of education: 12   Occupational History  . Scientist, product/process development   Social History Main Topics  . Smoking status: Never Smoker  . Smokeless tobacco: Never Used  . Alcohol use No  . Drug use: No  . Sexual activity: Not on file   Other Topics Concern  . Not on file   Social History Narrative   Lives in an ALF in [place]. Independent and ambulatory with cane.     Review of Systems: A complete ROS was negative except as per HPI.   Physical Exam: Stein pressure 136/88, pulse 84, temperature 99.7 F (37.6 C), temperature source Oral, resp. rate 24, SpO2 94 %.  General appearance: Obese gentleman resting in ER bed, in mild distress with persistent hacking cough HENT: Normocephalic, atraumatic, moist mucous membranes, poor dentition, scattered white plaques over buccal membranes and soft palate, no palpable lymphadenopathy, sinuses non-tender to palpation Eyes: PERRL, non-icteric Cardiovascular/Chest: Regular rate and rhythm, no murmurs, rubs, gallops, tender to palpation across sternum and bilateral chest wall Respiratory: Clear to auscultation bilaterally, normal work of breathing, no rales, rhonchi Abdomen: Obese, BS+, soft, mild epigastric tenderness, non-distended Extremities: Normal bulk and range of motion, 1+ pitting edema to mid calves, 2+ peripheral pulses Skin: Warm, dry, intact Psych: Appropriate affect, clear speech, thoughts linear and  goal-directed  Assessment & Plan by Problem: Principal Problem:   URI (upper respiratory infection) Active Problems:   Morbid obesity (HCC)   Dyslipidemia   HTN (hypertension)   Lactic acidosis   CAD (coronary artery disease)   Type 2 diabetes mellitus with vascular disease (HCC)  Upper respiratory infection, 1 week of progressive productive cough now with chills, myalgias, nausea flu-like illness. No fever, leukocytosis, or infiltrate on CXR but lactate trending up. Exam largely unremarkable except for white plaques over buccal membranes and soft palate, asymptomatic. Received 1 dose of Vanc/Zosyn and 2L IVF in the ED. -- Check influenza panel -- Tamiflu 75 mg BID for 5 days, empiric flu coverage -- Continue IV fluids, scheduled to  receive additional 2.5 L NS bolus via sepsis protocol -- Trend Lactate Q3h -- Follow Stein cultures -- Trend CBC -- Checking HIV antibody  -- Guaifenesin-codeine 5 mL Q4H PRN for cough -- Cepacol lozenge PRN -- Tylenol 650 mg Q6H PRN fever, mild pain -- Zofran 4 mg Q6H PRN for nausea  CAD, s/p DES to LAD in 05/2016 -- home Aspirin 81 mg and Plavix 75 mg -- home Atorvastatin 80 mg daily -- home Coreg 25 mg BID -- home Isordil 30 mg BID  Chronic lower extremity edema, 1+ pitting to mid calves on exam, no clear history of CHF with last LVEF 55-60% with no WMA and no diastolic dysfunction in [id number]. Weight 310 lbs, near dry weight of 305 lbs. -- Daily weights and strict I/Os -- home Lasix 40 mg BID -- home KCl 20 mEq daily -- TED compression hose  GERD/PUD -- home Pepcid 20 mg BID -- home Protonix 20 mg daily  T2DM, last A1c 8.5 on 11/01/2016, on four oral agents at home (Januvia, Invokana, Glipizide, Metformin) -- SSI-M and CBG TID AC HS -- home Gabapentin 600 mg QID  Hypertension -- home Hydralazine 50 mg BID  Chronic pain, osteoarthritis -- Hydrocodone-Acetaminophen 5-325 mg Q6h PRN -- home Methocarbamol 750 mg TID PRN  OSA - CPAP  QHS Depression - home Zoloft 50 mg daily Insomnia - home Trazodone 50 mg QHS  FEN/GI: CM diet, replete electrolytes as needed  DVT ppx: Lovenox  Code status: Full code  Dispo: Admit patient to Observation with expected length of stay less than 2 midnights.  Signed: Asencion Partridge, MD 12/04/2016, 12:37 AM  Pager: [phone]

## 2016-12-04 NOTE — Plan of Care (Signed)
Problem: Pain Managment: Goal: General experience of comfort will improve Outcome: Progressing Medicated once for pain  Problem: Activity: Goal: Risk for activity intolerance will decrease Outcome: Progressing Ambulates to BR independently and tolerates well  Problem: Bowel/Gastric: Goal: Will not experience complications related to bowel motility Outcome: Progressing Denies issues

## 2016-12-04 NOTE — Progress Notes (Signed)
Subjective: [name] reports no improvement this morning. Still reporting cough with yellow sputum production. Denies any shortness of breath. Reports myalgias, chills but no fever. Reports PO intake it reduced with little appetite.   Objective: Vital signs in last 24 hours: Vitals:   12/04/16 0245 12/04/16 0315 12/04/16 0414 12/04/16 0811  BP: 167/85 161/83 (!) 150/76 (!) 151/75  Pulse: 77 89 81 77  Resp: 26 20 18    Temp:   98.9 F (37.2 C)   TempSrc:   Oral   SpO2: 96% 97% 95%   Weight:   (!) 313 lb 11.2 oz (142.3 kg)   Height:       Weight change:   Intake/Output Summary (Last 24 hours) at 12/04/16 1257 Last data filed at 12/04/16 0900  Gross per 24 hour  Intake              880 ml  Output                0 ml  Net              880 ml    Physical Exam  Constitutional: He is well-developed, well-nourished, and in no distress. No distress.  Cardiovascular: Normal rate, regular rhythm and normal heart sounds.   Pulmonary/Chest: Effort normal and breath sounds normal. No respiratory distress. He has no wheezes. He has no rales.  Musculoskeletal: He exhibits edema.  1+ pitting edema in LE bilaterally, right wrist band tight 2/2 edema  Skin: Skin is warm and dry.  Psychiatric: Mood and affect normal.   Medications: I have reviewed the patient's current medications. Scheduled Meds: . aspirin  81 mg Oral Daily  . atorvastatin  80 mg Oral q1800  . carvedilol  25 mg Oral BID WC  . clopidogrel  75 mg Oral Daily  . enoxaparin (LOVENOX) injection  70 mg Subcutaneous Q24H  . famotidine  20 mg Oral BID  . furosemide  40 mg Oral BID  . gabapentin  600 mg Oral QID  . hydrALAZINE  50 mg Oral BID  . insulin aspart  0-15 Units Subcutaneous TID WC  . insulin aspart  0-5 Units Subcutaneous QHS  . isosorbide dinitrate  30 mg Oral BID  . oseltamivir  75 mg Oral BID  . pantoprazole  20 mg Oral Daily  . potassium chloride SA  20 mEq Oral Daily  . sertraline  50 mg Oral Daily  .  sodium chloride  1,000 mL Intravenous Once  . traZODone  50 mg Oral QHS   Continuous Infusions: . sodium chloride 100 mL/hr at 12/04/16 1042   PRN Meds:.acetaminophen **OR** acetaminophen, guaiFENesin-codeine, HYDROcodone-acetaminophen, menthol-cetylpyridinium, methocarbamol, ondansetron **OR** ondansetron (ZOFRAN) IV, polyethylene glycol Assessment/Plan:  Influenza B with lactic acidosis: Patient with continued influenza symptoms today. He is hemodynamically stable but still with poor PO intake. He received 3.5 L NS on admission. He has remained afebrile with no tachycardia, tachypnea or leukocytosis. His lactate was elevated >4 but has trended down to 2.6 with IVF. Suspect this is secondary to dehydration with continued Metformin use. Will continue IVF today until PO intake improves and trend lactate; holding metformin. Continue Tamiflu and symptomatic control. -Tamiflu 75 mg BID, day 1/5 -Acetaminophen prn (Ibuprofen listed as allergy 2/2 history of ulcers) -Continue NS 100 mL/hr x 10 hours -Trend Lactate  -Follow up blood cultures, still pending -Guaifenesin-codeine 5 mL q4hr prn -Cepacol lozenge prn -Zofran 4 mg q6hr prn   T2DM: Last A1c 8.5 on 11/01/2016. On  Januvia, Invokana, Glipizide, Metformin at home. Holding home medications.  -SSI-M and checking CBGs -Continue home gabapentin   HTN: Continue home hydralazine  CAD, s/p DES to LAD in 05/2016 -- home Aspirin 81 mg and Plavix 75 mg -- home Atorvastatin 80 mg daily -- home Coreg 25 mg BID -- home Isordil 30 mg BID  Chronic LE edema: No CHF history with last ECHO 05/2016 unremarkable. Likely chronic venous stasis. -Home lasix and KCl -TED hose  Chronic Pian Followed by pain clinic -Continue home Hydrocodone and methocarbamol   OSA: CPAP qhs  Depression - home Zoloft 50 mg daily  Insomnia - home Trazodone 50 mg QHS  Dispo: Disposition is deferred at this time, awaiting improvement of current medical problems.   Anticipated discharge in approximately 1 day(s).   The patient does have a current PCP (Pcp Not In System) and does need an [facility] hospital follow-up appointment after discharge.  The patient does not have transportation limitations that hinder transportation to clinic appointments.  .Services Needed at time of discharge: Y = Yes, Blank = No PT:   OT:   RN:   Equipment:   Other:     LOS: 0 days   Maryellen Pile, MD IMTS PGY-2 [phone] 12/04/2016, 12:57 PM

## 2016-12-04 NOTE — Progress Notes (Signed)
CRITICAL VALUE ALERT  Critical value received:  Lactic acid 2.6  Date of notification:  12/04/2016  Time of notification:  1222  Critical value read back:Yes.    Nurse who received alert:  Dorcas Carrow, RN  MD notified (1st page):  Dr. Lovena Le  Time of first page:  58  MD notified (2nd page):  Time of second page:  Responding MD:  Dr. Lovena Le  Time MD responded:  [phone]

## 2016-12-04 NOTE — ED Provider Notes (Signed)
[facility] DEPT Provider Note   CSN: [id number] Arrival date & time: 12/03/16  1947     History   Chief Complaint Chief Complaint  Patient presents with  . Cough  . Emesis    HPI [name] is a 56 y.o. male.  Patient presents emergency department with chief complaint of fever, chills, cough, and body aches. Contrary to triage note, patient has no known history of CHF. When questioned about this specifically, he states that he does not have CHF. Patient did not get a flu shot. He states that he has had a productive cough. He also reports some nausea, and vomiting. He was sent from urgent care, where he did have some wheezing. There are no other associated symptoms.   The history is provided by the patient. No language interpreter was used.    Past Medical History:  Diagnosis Date  . Arthritis   . Back pain   . CAD (coronary artery disease)    a. s/p DES to LAD in 05/2016  . Hyperlipidemia   . Hypertension   . IBS (irritable bowel syndrome)   . OSA (obstructive sleep apnea)   . Pancreatitis   . PUD (peptic ulcer disease)   . Type 2 diabetes mellitus [facility])     Patient Active Problem List   Diagnosis Date Noted  . Knee pain, left 11/30/2016  . Hyperlipidemia LDL goal <70 11/22/2016  . Type 2 diabetes mellitus with vascular disease ([place]) 05/31/2016  . Normocytic normochromic anemia 05/31/2016  . CAD (coronary artery disease) 01/06/2016  . DVT (deep venous thrombosis) ([place]) 01/06/2016  . Nonspecific chest pain 01/29/2014  . Uncontrolled secondary diabetes with peripheral neuropathy ([place]) 01/29/2014  . Morbid obesity ([place]) 01/29/2014  . Snoring 01/29/2014  . Dyslipidemia 01/29/2014  . HTN (hypertension) 01/29/2014  . Abnormal nuclear stress test 01/29/2014    Past Surgical History:  Procedure Laterality Date  . CARDIAC CATHETERIZATION N/A 05/31/2016   Procedure: Left Heart Cath and Coronary Angiography;  Surgeon: Peter M Martinique, MD;  Location: [facility]  CV LAB;  Service: Cardiovascular;  Laterality: N/A;  . CARDIAC CATHETERIZATION N/A 05/31/2016   Procedure: Intravascular Pressure Wire/FFR Study;  Surgeon: Peter M Martinique, MD;  Location: [facility] CV LAB;  Service: Cardiovascular;  Laterality: N/A;  . CARDIAC CATHETERIZATION N/A 05/31/2016   Procedure: Coronary Stent Intervention;  Surgeon: Peter M Martinique, MD;  Location: [facility] CV LAB;  Service: Cardiovascular;  Laterality: N/A;  . LEFT HEART CATHETERIZATION WITH CORONARY ANGIOGRAM N/A 02/03/2014   Procedure: LEFT HEART CATHETERIZATION WITH CORONARY ANGIOGRAM;  Surgeon: Pixie Casino, MD;  Location[facility] CATH LAB;  Service: Cardiovascular;  Laterality: N/A;  . left leg stent          Home Medications    Prior to Admission medications   Medication Sig Start Date End Date Taking? Authorizing Provider  aspirin 81 MG chewable tablet Chew 1 tablet (81 mg total) by mouth daily. 06/01/16  Yes Elwin Mocha, MD  atorvastatin (LIPITOR) 80 MG tablet Take 1 tablet (80 mg total) by mouth daily. 01/06/16  Yes Lelon Perla, MD  canagliflozin (INVOKANA) 100 MG TABS tablet Take 1 tablet (100 mg total) by mouth daily before breakfast. 12/01/16  Yes Ledell Noss, MD  carvedilol (COREG) 25 MG tablet Take 25 mg by mouth 2 (two) times daily with a meal.   Yes Historical Provider, MD  clopidogrel (PLAVIX) 75 MG tablet Take 75 mg by mouth daily.   Yes Historical Provider,  MD  famotidine (PEPCID) 20 MG tablet Take 20 mg by mouth 2 (two) times daily.   Yes Historical Provider, MD  furosemide (LASIX) 40 MG tablet Take 40 mg by mouth 2 (two) times daily.   Yes Historical Provider, MD  gabapentin (NEURONTIN) 300 MG capsule Take 600 mg by mouth 4 (four) times daily.    Yes Historical Provider, MD  glipiZIDE (GLUCOTROL) 10 MG tablet Take 1 tablet by mouth 2 (two) times daily. 11/13/15  Yes Historical Provider, MD  hydrALAZINE (APRESOLINE) 50 MG tablet Take 50 mg by mouth 2 (two) times daily.   Yes Historical Provider,  MD  isosorbide dinitrate (ISORDIL) 30 MG tablet Take 1 tablet (30 mg total) by mouth 2 (two) times daily. 11/22/16  Yes Erma [place], PA  metFORMIN (GLUCOPHAGE) 1000 MG tablet Take 1,000 mg by mouth 2 (two) times daily with a meal.   Yes Historical Provider, MD  methocarbamol (ROBAXIN) 750 MG tablet Take 750 mg by mouth 3 (three) times daily as needed for muscle spasms.  10/12/16  Yes Historical Provider, MD  pantoprazole (PROTONIX) 20 MG tablet Take 20 mg by mouth daily.  10/04/16  Yes Historical Provider, MD  potassium chloride SA (K-DUR,KLOR-CON) 20 MEQ tablet Take 20 mEq by mouth daily.   Yes Historical Provider, MD  sertraline (ZOLOFT) 50 MG tablet Take 50 mg by mouth daily.   Yes Historical Provider, MD  sitaGLIPtin (JANUVIA) 100 MG tablet Take 100 mg by mouth daily.   Yes Historical Provider, MD  traZODone (DESYREL) 50 MG tablet Take 50 mg by mouth at bedtime.   Yes Historical Provider, MD    Family History Family History  Problem Relation Age of Onset  . Cancer Father   . Hypertension Mother   . Diabetes Mother   . Breast cancer Mother   . Hypertension Brother   . Diabetes Brother   . Hypertension Sister   . Diabetes Sister     Social History Social History  Substance Use Topics  . Smoking status: Never Smoker  . Smokeless tobacco: Never Used  . Alcohol use No     Allergies   Coconut oil and Ibuprofen   Review of Systems Review of Systems  Constitutional: Positive for chills and fever.  Respiratory: Positive for cough.   Gastrointestinal: Positive for nausea and vomiting.  All other systems reviewed and are negative.    Physical Exam Updated Vital Signs BP 136/88   Pulse 84   Temp 99.7 F (37.6 C) (Oral)   Resp 24   SpO2 94%   Physical Exam  Constitutional: He is oriented to person, place, and time. He appears well-developed and well-nourished.  HENT:  Head: Normocephalic and atraumatic.  Eyes: Conjunctivae and EOM are normal. Pupils are equal,  round, and reactive to light. Right eye exhibits no discharge. Left eye exhibits no discharge. No scleral icterus.  Neck: Normal range of motion. Neck supple. No JVD present.  Cardiovascular: Normal rate, regular rhythm and normal heart sounds.  Exam reveals no gallop and no friction rub.   No murmur heard. Pulmonary/Chest: Effort normal and breath sounds normal. No respiratory distress. He has no wheezes. He has no rales. He exhibits no tenderness.  Clear to auscultation  Abdominal: Soft. He exhibits no distension and no mass. There is no tenderness. There is no rebound and no guarding.  Musculoskeletal: Normal range of motion. He exhibits no edema or tenderness.  Neurological: He is alert and oriented to person, place, and time.  Skin: Skin is warm and dry.  Psychiatric: He has a normal mood and affect. His behavior is normal. Judgment and thought content normal.  Nursing note and vitals reviewed.    ED Treatments / Results  Labs (all labs ordered are listed, but only abnormal results are displayed) Labs Reviewed  BASIC METABOLIC PANEL - Abnormal; Notable for the following:       Result Value   Potassium 3.2 (*)    Glucose, Bld 186 (*)    All other components within normal limits  CBC - Abnormal; Notable for the following:    RBC 4.04 (*)    Hemoglobin 12.1 (*)    HCT 36.4 (*)    All other components within normal limits  I-STAT CG4 LACTIC ACID, ED - Abnormal; Notable for the following:    Lactic Acid, Venous 3.02 (*)    All other components within normal limits  I-STAT CG4 LACTIC ACID, ED - Abnormal; Notable for the following:    Lactic Acid, Venous 4.43 (*)    All other components within normal limits  BRAIN NATRIURETIC PEPTIDE  I-STAT TROPOININ, ED    EKG  EKG Interpretation None       Radiology Dg Chest 2 View  Result Date: 12/03/2016 CLINICAL DATA:  Chest pain. EXAM: CHEST  2 VIEW COMPARISON:  November 21, 2016 FINDINGS: Stable cardiomegaly. The hila,  mediastinum, lungs, and pleura are unchanged. No acute abnormalities. IMPRESSION: No interval change. Electronically Signed   By: Dorise Bullion III M.D   On: 12/03/2016 21:23    Procedures Procedures (including critical care time) CRITICAL CARE Performed by: Montine Circle   Total critical care time: 35 minutes  Critical care time was exclusive of separately billable procedures and treating other patients.  Critical care was necessary to treat or prevent imminent or life-threatening deterioration.  Critical care was time spent personally by me on the following activities: development of treatment plan with patient and/or surrogate as well as nursing, discussions with consultants, evaluation of patient's response to treatment, examination of patient, obtaining history from patient or surrogate, ordering and performing treatments and interventions, ordering and review of laboratory studies, ordering and review of radiographic studies, pulse oximetry and re-evaluation of patient's condition.  Medications Ordered in ED Medications  sodium chloride 0.9 % bolus 1,000 mL (1,000 mLs Intravenous New Bag/Given 12/03/16 2247)  HYDROcodone-homatropine (HYCODAN) 5-1.5 MG/5ML syrup 5 mL (5 mLs Oral Given 12/03/16 2316)     Initial Impression / Assessment and Plan / ED Course  I have reviewed the triage vital signs and the nursing notes.  Pertinent labs & imaging results that were available during my care of the patient were reviewed by me and considered in my medical decision making (see chart for details).     Patient with flulike symptoms. He has had a cough, fever, body aches, nausea, and vomiting. He has a low-grade temperature of 99.7. His oxygen saturation is greater than 90%. His blood pressures in men stable. His initial lactic acid level is 3.04. Because his blood pressures were stable, and lactate was less than 4, could sepsis was not activated, and the patient did not initially receive  30 ML per kilogram fluid boluses.  Patient given 1 L of fluid, we'll repeat lactic acid. BNP is 27.3   Repeat lactate is 4.43, trending up. I suspect that the patient's symptoms are flu related, however now that his lactate is greater than 4, will activate code sepsis. Will treat with Bactrim antibiotics, give fluid  boluses, and will also give Tamiflu.  I discussed this plan with Dr. Betsey Holiday, who agrees.  Discussed the case with the internal medicine teaching service, who will come to admit the patient.  Final Clinical Impressions(s) / ED Diagnoses   Final diagnoses:  Flu-like symptoms    New Prescriptions New Prescriptions   No medications on file     Montine Circle, PA-C 12/04/16 0042    Leo Grosser, MD 12/04/16 [phone]

## 2016-12-04 NOTE — Progress Notes (Signed)
CRITICAL VALUE ALERT  Critical value received:  Lactic acid 3.0  Date of notification:  12/04/16  Time of notification:  [id number]  Critical value read back:Yes.    Nurse who received alert:  H. Rosana Hoes, RN  MD notified (1st page):  Dr. Lovena Le  Time of first page:  29  MD notified (2nd page):  Time of second page:  Responding MD:  Dr. Lovena Le  Time MD responded:  1426

## 2016-12-04 NOTE — Progress Notes (Signed)
CRITICAL VALUE ALERT  Critical value received:  Lactic acid 2.6  Date of notification:  12/04/2016  Time of notification:  [id number]  Critical value read back:Yes.    Nurse who received alert:  Dorcas Carrow, RN  MD notified (1st page):  Dr. Lovena Le  Time of first page:  [phone]  MD notified (2nd page):  Time of second page:  Responding MD:  Dr. Lovena Le  Time MD responded:  [phone]

## 2016-12-04 NOTE — Progress Notes (Signed)
Pt declines CPAP machine at this time.

## 2016-12-04 NOTE — Progress Notes (Signed)
Page did not repeat lactic acid. Currently to acid 2.6. This was the same as his previous lactic acid. Continue to monitor.

## 2016-12-04 NOTE — Progress Notes (Signed)
I was paged by the nurse concerning a critical lactic acid value. The most recent value is improved from prior and his LA is trending down. I will notify the primary team taking care of [name] of this value. Currently, the patient is afebrile and hemodynamically stable. We will continue to monitor.

## 2016-12-05 ENCOUNTER — Telehealth: Payer: Self-pay

## 2016-12-05 DIAGNOSIS — Z7982 Long term (current) use of aspirin: Secondary | ICD-10-CM

## 2016-12-05 DIAGNOSIS — E876 Hypokalemia: Secondary | ICD-10-CM

## 2016-12-05 DIAGNOSIS — Z79891 Long term (current) use of opiate analgesic: Secondary | ICD-10-CM

## 2016-12-05 DIAGNOSIS — K219 Gastro-esophageal reflux disease without esophagitis: Secondary | ICD-10-CM

## 2016-12-05 DIAGNOSIS — E119 Type 2 diabetes mellitus without complications: Secondary | ICD-10-CM

## 2016-12-05 DIAGNOSIS — Z7984 Long term (current) use of oral hypoglycemic drugs: Secondary | ICD-10-CM

## 2016-12-05 DIAGNOSIS — I2510 Atherosclerotic heart disease of native coronary artery without angina pectoris: Secondary | ICD-10-CM

## 2016-12-05 DIAGNOSIS — J101 Influenza due to other identified influenza virus with other respiratory manifestations: Secondary | ICD-10-CM

## 2016-12-05 DIAGNOSIS — R600 Localized edema: Secondary | ICD-10-CM

## 2016-12-05 DIAGNOSIS — Z886 Allergy status to analgesic agent status: Secondary | ICD-10-CM

## 2016-12-05 DIAGNOSIS — I10 Essential (primary) hypertension: Secondary | ICD-10-CM

## 2016-12-05 DIAGNOSIS — M1990 Unspecified osteoarthritis, unspecified site: Secondary | ICD-10-CM

## 2016-12-05 DIAGNOSIS — Z79899 Other long term (current) drug therapy: Secondary | ICD-10-CM

## 2016-12-05 DIAGNOSIS — Z91018 Allergy to other foods: Secondary | ICD-10-CM

## 2016-12-05 DIAGNOSIS — Z955 Presence of coronary angioplasty implant and graft: Secondary | ICD-10-CM

## 2016-12-05 LAB — CK: Total CK: 49 U/L (ref 49–397)

## 2016-12-05 LAB — LACTIC ACID, PLASMA
Lactic Acid, Venous: 2.7 mmol/L (ref 0.5–1.9)
Lactic Acid, Venous: 2 mmol/L (ref 0.5–1.9)

## 2016-12-05 LAB — GLUCOSE, CAPILLARY
Glucose-Capillary: 212 mg/dL — ABNORMAL HIGH (ref 65–99)
Glucose-Capillary: 289 mg/dL — ABNORMAL HIGH (ref 65–99)

## 2016-12-05 MED ORDER — OSELTAMIVIR PHOSPHATE 75 MG PO CAPS: 75.0000 mg | ORAL_CAPSULE | Freq: Two times a day (BID) | ORAL | 0 refills | Status: DC

## 2016-12-05 MED ORDER — BENZONATATE 100 MG PO CAPS: 200.0000 mg | ORAL_CAPSULE | Freq: Two times a day (BID) | ORAL | 0 refills | Status: DC

## 2016-12-05 MED ORDER — MENTHOL 3 MG MT LOZG: 1.0000 | LOZENGE | OROMUCOSAL | 12 refills | Status: DC | PRN

## 2016-12-05 NOTE — Progress Notes (Signed)
Reviewed discharge instructions/medications with patient.  Answered all of his questions.  Patient is stable and ready for discharge.  Patient is waiting on his ride.

## 2016-12-05 NOTE — Discharge Summary (Signed)
Name: [name] MRN: [id number] DOB: 12-Jan-1961 56 y.o. PCP: Pcp Not In System  Date of Admission: 12/03/2016  8:23 PM Date of Discharge: 12/05/2016 Attending Physician: Axel Filler, MD  Discharge Diagnosis: 1. Influenza B Principal Problem:   Influenza B Active Problems:   Morbid obesity (HCC)   Dyslipidemia   HTN (hypertension)   Lactic acidosis   CAD (coronary artery disease)   Type 2 diabetes mellitus with vascular disease ([place])  Discharge Medications: Allergies as of 12/05/2016      Reactions   Coconut Oil Hives   Ibuprofen Other (See Comments)   Made gastric ulcers worse      Medication List    TAKE these medications   aspirin 81 MG chewable tablet Chew 1 tablet (81 mg total) by mouth daily.   atorvastatin 80 MG tablet Commonly known as:  LIPITOR Take 1 tablet (80 mg total) by mouth daily.   benzonatate 100 MG capsule Commonly known as:  TESSALON PERLES Take 2 capsules (200 mg total) by mouth 2 (two) times daily.   canagliflozin 100 MG Tabs tablet Commonly known as:  INVOKANA Take 1 tablet (100 mg total) by mouth daily before breakfast.   carvedilol 25 MG tablet Commonly known as:  COREG Take 25 mg by mouth 2 (two) times daily with a meal.   clopidogrel 75 MG tablet Commonly known as:  PLAVIX Take 75 mg by mouth daily.   famotidine 20 MG tablet Commonly known as:  PEPCID Take 20 mg by mouth 2 (two) times daily.   furosemide 40 MG tablet Commonly known as:  LASIX Take 40 mg by mouth 2 (two) times daily.   gabapentin 300 MG capsule Commonly known as:  NEURONTIN Take 600 mg by mouth 4 (four) times daily.   glipiZIDE 10 MG tablet Commonly known as:  GLUCOTROL Take 1 tablet by mouth 2 (two) times daily.   hydrALAZINE 50 MG tablet Commonly known as:  APRESOLINE Take 50 mg by mouth 2 (two) times daily.   isosorbide dinitrate 30 MG tablet Commonly known as:  ISORDIL Take 1 tablet (30 mg total) by mouth 2 (two) times daily.   menthol-cetylpyridinium 3 MG lozenge Commonly known as:  CEPACOL Take 1 lozenge (3 mg total) by mouth as needed for sore throat.   metFORMIN 1000 MG tablet Commonly known as:  GLUCOPHAGE Take 1,000 mg by mouth 2 (two) times daily with a meal.   methocarbamol 750 MG tablet Commonly known as:  ROBAXIN Take 750 mg by mouth 3 (three) times daily as needed for muscle spasms.   oseltamivir 75 MG capsule Commonly known as:  TAMIFLU Take 1 capsule (75 mg total) by mouth 2 (two) times daily.   pantoprazole 20 MG tablet Commonly known as:  PROTONIX Take 20 mg by mouth daily.   potassium chloride SA 20 MEQ tablet Commonly known as:  K-DUR,KLOR-CON Take 20 mEq by mouth daily.   sertraline 50 MG tablet Commonly known as:  ZOLOFT Take 50 mg by mouth daily.   sitaGLIPtin 100 MG tablet Commonly known as:  JANUVIA Take 100 mg by mouth daily.   traZODone 50 MG tablet Commonly known as:  DESYREL Take 50 mg by mouth at bedtime.       Disposition and follow-up:   [name] was discharged from [facility] in Stable condition.  At the hospital follow up visit please address:  1.   Influenza B: --has patient finished Tamiflu course? Last day 2/15 --  is his cough improving?  --any fevers or chills?  Hypokalemia: --is he taking his KCl 47mEq daily? --please check BMP  2.  Labs / imaging needed at time of follow-up: BMP  3.  Pending labs/ test needing follow-up: Blood culture, NGTD x 4d  Hospital Course by problem list: Principal Problem:   Influenza B Active Problems:   Morbid obesity (HCC)   Dyslipidemia   HTN (hypertension)   Lactic acidosis   CAD (coronary artery disease)   Type 2 diabetes mellitus with vascular disease ([place])   Influenza B: Patient was admitted with productive cough, wheezing, fatigue and myalgias and was found to have Influenza B on PCR. On admission he was found to have lactic acidosis likely due to influenza infection. His  CK level was unremarkable and no other acute cause of lactic acidosis was found; his blood cultures remained negative to date, final results pending. He was started on Tamiflu, given IVF and supportive therapy for his symptoms. His lactic acidosis resolved and his symptoms overall improved. He was discharged with Tamiflu to complete a 5 day course, tylenol for myalgias, and Tessalon Perles for cough.  Chronic LE edema, hypokalemia: Patient with no history of CHF (last echo 05/2016 was unremarkable), treated with lasix. Due to lasix therapy, he is supplementing his potassium 35mEq per day. On admission, his K was 3.1. He was given an extra 74mEq KDUR on top of his daily dose. Please check his BMP for potassium levels and consider increasing his daily dose if still hypokalemic.  T2DM: Last A1c 8.5 on 11/01/2016. While in the hospital, he received sliding scale insulin. He was continued on his home regimen of Januvia, Invokana, Glipizide, and metformin at discharge.  HTN: Patient was continued on his home regimen of hydralazine 50mg  BID.  CAD: Patient with DES to LAD in 05/2016. He was continued on his home regimen of aspirin 81mg  daily, plavix 75mg  daily, atorvastatin 80mg  daily, Coreg 25mg  BID, and Isordil 30mg  BID.  GERD: Patient was continued on his home regimen of Pecid 20mg  BID and protonix 20mg  daily.  Chronic pain: Patient with chronic pain 2/2 osteoarthritis. He was continued on his home regimen of hydrocodone-acetaminophen 5-325mg  q6hr PRN and methocarbamol 750mg  TID PRN.   Discharge Vitals:   BP (!) 154/74 (BP Location: Right Arm)   Pulse 79   Temp 99.4 F (37.4 C) (Oral)   Resp 18   Ht 5\' 10"  (1.778 m)   Wt (!) 313 lb 11.2 oz (142.3 kg)   SpO2 95%   BMI 45.01 kg/m   Pertinent Labs, Studies, and Procedures:  BMP Latest Ref Rng & Units 12/07/2016 12/04/2016 12/03/2016  Glucose 65 - 99 mg/dL 179(H) 176(H) 186(H)  BUN 6 - 24 mg/dL 9 11 13   Creatinine 0.76 - 1.27 mg/dL 0.69(L) 0.83  0.79  BUN/Creat Ratio 9 - 20 13 - -  Sodium 134 - 144 mmol/L 141 137 137  Potassium 3.5 - 5.2 mmol/L 3.9 3.1(L) 3.2(L)  Chloride 96 - 106 mmol/L 97 102 101  CO2 18 - 29 mmol/L 23 21(L) 24  Calcium 8.7 - 10.2 mg/dL 9.2 8.5(L) 9.2   CBC Latest Ref Rng & Units 12/04/2016 12/04/2016 12/03/2016  WBC 4.0 - 10.5 K/uL 7.0 7.6 8.7  Hemoglobin 13.0 - 17.0 g/dL 11.7(L) 11.9(L) 12.1(L)  Hematocrit 39.0 - 52.0 % 36.1(L) 36.7(L) 36.4(L)  Platelets 150 - 400 K/uL 197 201 220   Influenza B positive CK 49 HIV nonreactive  Discharge Instructions: Discharge Instructions  Call MD for:  difficulty breathing, headache or visual disturbances    Complete by:  As directed    Call MD for:  extreme fatigue    Complete by:  As directed    Call MD for:  persistant dizziness or light-headedness    Complete by:  As directed    Call MD for:  persistant nausea and vomiting    Complete by:  As directed    Call MD for:  severe uncontrolled pain    Complete by:  As directed    Call MD for:  temperature >100.4    Complete by:  As directed    Diet - low sodium heart healthy    Complete by:  As directed    Discharge instructions    Complete by:  As directed    You were in the hospital for the flu.  --please pick up Tamiflu (sent to your pharmacy) and take twice a day (total 5 days) --Your cough may continue for a week or so; if you feel it is getting worse, you're producing more sputum, shortness of breath, fevers, chills, or if it is not improving at all, please return to the clinic for evaluation. --I have sent Cepachol losanges and tessalon perles to your pharmacy to help with the cough --you can take up 3000mg  of tylenol a day in total for your body aches if needed.  Your clinic follow up appointment that was scheduled for Feb 15 has been moved to the 22nd. Please call if you need to be seen before that time.   Increase activity slowly    Complete by:  As directed       Signed: Alphonzo Grieve,  MD 12/05/2016, 11:51 AM   Pager [phone]

## 2016-12-05 NOTE — Telephone Encounter (Signed)
Hospital TOC per Dr Jari Favre, discharge 12/05/2016, appt 12/15/2016.

## 2016-12-05 NOTE — Progress Notes (Signed)
Subjective: Patient reports improvement of symptoms though still not back to baseline. He continues endorsing body aches but this seems to be localized to his chronic right sided body pain. He continues to have a productive cough with no increase in shortness of breath. He endorses bilateral chest pain with coughing episodes, but none otherwise. He is tolerating PO intake and has walked in the hall with no shortness of breath or chest pain.   Objective: Vital signs in last 24 hours: Vitals:   12/04/16 1500 12/04/16 2053 12/05/16 0439 12/05/16 0826  BP: (!) 154/77 (!) 111/58 130/79 (!) 154/74  Pulse: 79 77 74 79  Resp: 18 18 18    Temp: 98.7 F (37.1 C) 99.4 F (37.4 C) 99.4 F (37.4 C)   TempSrc: Oral Oral Oral   SpO2: 98% 94% 98% 95%  Weight:      Height:       Weight change:   Intake/Output Summary (Last 24 hours) at 12/05/16 1131 Last data filed at 12/05/16 0900  Gross per 24 hour  Intake             1570 ml  Output                0 ml  Net             1570 ml    Physical Exam  Constitutional: He is well-developed, well-nourished, and in no distress.  Cardiovascular: Normal rate, regular rhythm and normal heart sounds.   Pulmonary/Chest: Effort normal and breath sounds normal. No respiratory distress. He has no wheezes. He has no rales.  Abdominal: Soft. Bowel sounds are normal. He exhibits no distension.  Musculoskeletal: He exhibits edema.  1+ pitting edema in LE bilaterally; TED hose on.  Skin: Skin is warm and dry.  Psychiatric: Mood and affect normal.   Medications: I have reviewed the patient's current medications. Scheduled Meds: . aspirin  81 mg Oral Daily  . atorvastatin  80 mg Oral q1800  . carvedilol  25 mg Oral BID WC  . clopidogrel  75 mg Oral Daily  . enoxaparin (LOVENOX) injection  70 mg Subcutaneous Q24H  . famotidine  20 mg Oral BID  . furosemide  40 mg Oral BID  . gabapentin  600 mg Oral QID  . hydrALAZINE  50 mg Oral BID  . insulin aspart   0-15 Units Subcutaneous TID WC  . insulin aspart  0-5 Units Subcutaneous QHS  . isosorbide dinitrate  30 mg Oral BID  . oseltamivir  75 mg Oral BID  . pantoprazole  20 mg Oral Daily  . potassium chloride SA  20 mEq Oral Daily  . sertraline  50 mg Oral Daily  . sodium chloride  1,000 mL Intravenous Once  . traZODone  50 mg Oral QHS   Continuous Infusions:  PRN Meds:.acetaminophen **OR** acetaminophen, guaiFENesin-codeine, HYDROcodone-acetaminophen, menthol-cetylpyridinium, methocarbamol, ondansetron **OR** ondansetron (ZOFRAN) IV, polyethylene glycol Assessment/Plan:  Influenza B with lactic acidosis: Patient with improvement of LA (at 2 early this AM). He states improvement in symptoms, though still not back to baseline. He was able to ambulate without much trouble and is taking PO intake, which is reassuring. Will d/c home with f/u in [facility]. Patient stated understanding of return precautions and is in agreement with continuing flu treatment with tamiflu and prn tylenol. Due to continued complaints of body aches and lactic acidosis, we checked a CK level which was 49. --Tamiflu 75 mg BID, day 2/5 --Acetaminophen prn (Ibuprofen listed as  allergy 2/2 history of ulcers) --Follow up blood cultures, still pending --Cepacol lozenge prn  T2DM: Last A1c 8.5 on 11/01/2016. On Januvia, Invokana, Glipizide, Metformin at home. --restart home meds at discharge   HTN: Continue home hydralazine  CAD, s/p DES to LAD in 05/2016 -- home Aspirin 81 mg and Plavix 75 mg -- home Atorvastatin 80 mg daily -- home Coreg 25 mg BID -- home Isordil 30 mg BID  Chronic LE edema: No CHF history with last ECHO 05/2016 unremarkable.Per patient a little worse after receiving IVF. -Home lasix and KCl -TED hose  Chronic Pian Followed by pain clinic -Continue home Hydrocodone and methocarbamol   OSA: CPAP qhs  Depression - home Zoloft 50 mg daily  Insomnia - home Trazodone 50 mg QHS  Dispo: Discharge to home  today.  Alphonzo Grieve, MD Pager: [phone] 12/05/2016, 11:31 AM

## 2016-12-05 NOTE — Progress Notes (Signed)
CRITICAL VALUE ALERT  Critical value received:  Lactic Acid 2.7  Date of notification:  12/05/2016  Time of notification:  0015  Critical value read back: Yes  Nurse who received alert:  Jari Sportsman, RN  MD notified (1st page):  [facility]  Time of first page:  0020  MD notified (2nd page):  Time of second page:  Responding MD:    Time MD responded:

## 2016-12-06 ENCOUNTER — Telehealth: Payer: Self-pay

## 2016-12-06 NOTE — Telephone Encounter (Signed)
Pt states he is not feeling well requesting to speak with a nurse. Please call back.  Scheduled appt 12/07/2016 @ 8:15.

## 2016-12-07 ENCOUNTER — Ambulatory Visit (INDEPENDENT_AMBULATORY_CARE_PROVIDER_SITE_OTHER): Payer: Medicaid Other | Admitting: Pulmonary Disease

## 2016-12-07 ENCOUNTER — Ambulatory Visit (HOSPITAL_COMMUNITY)
Admission: RE | Admit: 2016-12-07 | Discharge: 2016-12-07 | Disposition: A | Discharge status: Home / Self Care | Payer: Medicaid Other | Source: Ambulatory Visit | Attending: Internal Medicine | Admitting: Internal Medicine

## 2016-12-07 VITALS — BP 137/73 | HR 79 | Temp 98.1°F | Ht 70.0 in | Wt 311.3 lb

## 2016-12-07 DIAGNOSIS — I10 Essential (primary) hypertension: Secondary | ICD-10-CM

## 2016-12-07 DIAGNOSIS — M25551 Pain in right hip: Secondary | ICD-10-CM | POA: Diagnosis not present

## 2016-12-07 DIAGNOSIS — E876 Hypokalemia: Secondary | ICD-10-CM | POA: Insufficient documentation

## 2016-12-07 DIAGNOSIS — G8929 Other chronic pain: Secondary | ICD-10-CM | POA: Diagnosis not present

## 2016-12-07 DIAGNOSIS — J101 Influenza due to other identified influenza virus with other respiratory manifestations: Secondary | ICD-10-CM

## 2016-12-07 DIAGNOSIS — E559 Vitamin D deficiency, unspecified: Secondary | ICD-10-CM | POA: Diagnosis not present

## 2016-12-07 DIAGNOSIS — B9789 Other viral agents as the cause of diseases classified elsewhere: Secondary | ICD-10-CM

## 2016-12-07 DIAGNOSIS — Z79899 Other long term (current) drug therapy: Secondary | ICD-10-CM | POA: Diagnosis not present

## 2016-12-07 MED ORDER — KETOROLAC TROMETHAMINE 60 MG/2ML IM SOLN
60.0000 mg | Freq: Once | INTRAMUSCULAR | Status: AC
  Administered 2016-12-07: 60 mg via INTRAMUSCULAR

## 2016-12-07 NOTE — Telephone Encounter (Signed)
Pt is in clinic now, closing encounter

## 2016-12-07 NOTE — Progress Notes (Signed)
Internal Medicine Clinic Attending  Case discussed with Dr. Blum at the time of the visit.  We reviewed the resident's history and exam and pertinent patient test results.  I agree with the assessment, diagnosis, and plan of care documented in the resident's note. 

## 2016-12-07 NOTE — Patient Instructions (Addendum)
Please get your xrays done. We will call you with results. Follow up with your sports medicine doctors Follow up here in 4-6 weeks

## 2016-12-07 NOTE — Progress Notes (Signed)
   CC: right hip pain  HPI:  [name] is a 56 y.o. man with history as noted below presenting with acute on chronic right hip pain.  He has had right hip pain for 2-3 months. He has been seeing Sports Medicine in [facility]. Got worse yesterday. Pain is most in hip down to calf. He took Robaxin and hydrocodone for it. He last had a steroid injection two weeks ago. He has fallen 4 times over the last month. He last feel on the 6th. Sitting makes the pain worse. Standing improves it. He has associated lower back pain.     Past Medical History:  Diagnosis Date  . Arthritis   . Back pain   . CAD (coronary artery disease)    a. s/p DES to LAD in 05/2016  . Hyperlipidemia   . Hypertension   . IBS (irritable bowel syndrome)   . OSA (obstructive sleep apnea)   . Pancreatitis   . PUD (peptic ulcer disease)   . Type 2 diabetes mellitus (HCC)     Review of Systems:   +chills, no fever No dysuria  Physical Exam:  Vitals:   12/07/16 0823 12/07/16 0825  BP: (!) 151/73 137/73  Pulse: 83 79  Temp: 98.1 F (36.7 C)   TempSrc: Oral   SpO2: 96%   Weight: (!) 311 lb 4.8 oz (141.2 kg)   Height: 5\' 10"  (1.778 m)    General Apperance: NAD HEENT: Normocephalic, atraumatic, anicteric sclera Neck: Supple, trachea midline Lungs: Clear to auscultation bilaterally. No wheezes, rhonchi or rales. Breathing comfortably Heart: Regular rate and rhythm, no murmur/rub/gallop Abdomen: Soft, nontender, nondistended, no rebound/guarding Extremities: Warm and well perfused, no edema. Pain with internal and external rotation. No pain on palpation of thigh muscles. Skin: No rashes or lesions Neurologic: Alert and interactive. No gross deficits.  Assessment & Plan:   See Encounters Tab for problem based charting.  Patient discussed with Dr. Dareen Piano

## 2016-12-07 NOTE — Assessment & Plan Note (Signed)
Had mild hypokalemia on discharge.  Recheck BMP

## 2016-12-07 NOTE — Assessment & Plan Note (Signed)
Assessment: BP 137/73 after repeat.   Plan: Continue carvedilol 25mg  BID Continue hydralazine 50mg  BID

## 2016-12-07 NOTE — Assessment & Plan Note (Signed)
Finishing Tamiflu

## 2016-12-07 NOTE — Assessment & Plan Note (Signed)
Assessment: Acute on chronic right hip pain. Followed by sports medicine in [facility]. Unrelieved by Robaxin and hydrocodone. Getting steroid injections from them. XR hip and lumbar spine with no acute abnormality. R>L hip degenerative changes.   Plan: Toradol 60mg  IM x 1 Continue current home pain meds Follow up with sports medicine Check vitamin D level

## 2016-12-08 ENCOUNTER — Ambulatory Visit: Payer: Medicaid Other

## 2016-12-08 LAB — BMP8+ANION GAP
Anion Gap: 21 mmol/L — ABNORMAL HIGH (ref 10.0–18.0)
BUN/Creatinine Ratio: 13 (ref 9–20)
BUN: 9 mg/dL (ref 6–24)
CO2: 23 mmol/L (ref 18–29)
Calcium: 9.2 mg/dL (ref 8.7–10.2)
Chloride: 97 mmol/L (ref 96–106)
Creatinine, Ser: 0.69 mg/dL — ABNORMAL LOW (ref 0.76–1.27)
GFR calc Af Amer: 125 mL/min/{1.73_m2} (ref >59)
GFR calc non Af Amer: 108 mL/min/{1.73_m2} (ref >59)
Glucose: 179 mg/dL — ABNORMAL HIGH (ref 65–99)
Potassium: 3.9 mmol/L (ref 3.5–5.2)
Sodium: 141 mmol/L (ref 134–144)

## 2016-12-08 LAB — VITAMIN D 25 HYDROXY (VIT D DEFICIENCY, FRACTURES): Vit D, 25-Hydroxy: 15.5 ng/mL — ABNORMAL LOW (ref 30.0–100.0)

## 2016-12-08 NOTE — Progress Notes (Signed)
Internal Medicine Clinic Attending  Case discussed with Dr. Krall at the time of the visit.  We reviewed the resident's history and exam and pertinent patient test results.  I agree with the assessment, diagnosis, and plan of care documented in the resident's note.  

## 2016-12-09 DIAGNOSIS — E559 Vitamin D deficiency, unspecified: Secondary | ICD-10-CM | POA: Insufficient documentation

## 2016-12-09 LAB — CULTURE, BLOOD (ROUTINE X 2)
Culture: NO GROWTH
Culture: NO GROWTH

## 2016-12-09 MED ORDER — VITAMIN D 1000 UNITS PO TABS: 1000.0000 [IU] | ORAL_TABLET | Freq: Every day | ORAL | 3 refills | Status: DC

## 2016-12-09 NOTE — Telephone Encounter (Signed)
Transition Care Management Follow-up Telephone Call   Date discharged? I think monday   How have you been since you were released from the hospital? good   Do you understand why you were in the hospital? yes   Do you understand the discharge instructions? yes   Where were you discharged to? home   Items Reviewed:  Medications reviewed: yes  Allergies reviewed: yes  Dietary changes reviewed: yes  Referrals reviewed: yes   Functional Questionnaire:   Activities of Daily Living (ADLs):   He states they are independent in the following: self States they require assistance with the following: self   Any transportation issues/concerns?: no   Any patient concerns? Wish my doctor can find out what's wrong with my leg but it was going on before I got sick   Confirmed importance and date/time of follow-up visits scheduled yes  Provider Appointment booked with dr Randell Patient 2/14, already saw my dr, dr tipton too  Confirmed with patient if condition begins to worsen call PCP or go to the ER.  Patient was given the office number and encouraged to call back with question or concerns.  : yes

## 2016-12-09 NOTE — Assessment & Plan Note (Signed)
Vit D 15.5. Will start 1000 international units daily. Discussed with patient via phone

## 2016-12-09 NOTE — Addendum Note (Signed)
Addended by: Jacques Earthly T on: 12/09/2016 05:25 PM   Modules accepted: Orders

## 2016-12-15 ENCOUNTER — Ambulatory Visit (INDEPENDENT_AMBULATORY_CARE_PROVIDER_SITE_OTHER): Payer: Medicaid Other | Admitting: Internal Medicine

## 2016-12-15 ENCOUNTER — Encounter: Payer: Self-pay | Admitting: Internal Medicine

## 2016-12-15 VITALS — BP 139/62 | HR 97 | Temp 98.1°F | Ht 70.0 in | Wt 310.2 lb

## 2016-12-15 DIAGNOSIS — E559 Vitamin D deficiency, unspecified: Secondary | ICD-10-CM

## 2016-12-15 DIAGNOSIS — Z7984 Long term (current) use of oral hypoglycemic drugs: Secondary | ICD-10-CM

## 2016-12-15 DIAGNOSIS — G8929 Other chronic pain: Secondary | ICD-10-CM

## 2016-12-15 DIAGNOSIS — F32A Depression, unspecified: Secondary | ICD-10-CM

## 2016-12-15 DIAGNOSIS — E876 Hypokalemia: Secondary | ICD-10-CM | POA: Diagnosis not present

## 2016-12-15 DIAGNOSIS — J101 Influenza due to other identified influenza virus with other respiratory manifestations: Secondary | ICD-10-CM

## 2016-12-15 DIAGNOSIS — F329 Major depressive disorder, single episode, unspecified: Secondary | ICD-10-CM

## 2016-12-15 DIAGNOSIS — Z79899 Other long term (current) drug therapy: Secondary | ICD-10-CM

## 2016-12-15 DIAGNOSIS — E1159 Type 2 diabetes mellitus with other circulatory complications: Secondary | ICD-10-CM

## 2016-12-15 DIAGNOSIS — M25551 Pain in right hip: Secondary | ICD-10-CM | POA: Diagnosis not present

## 2016-12-15 DIAGNOSIS — Z8709 Personal history of other diseases of the respiratory system: Secondary | ICD-10-CM | POA: Diagnosis not present

## 2016-12-15 MED ORDER — VITAMIN D 1000 UNITS PO TABS: 1000.0000 [IU] | ORAL_TABLET | Freq: Every day | ORAL | 3 refills | Status: DC

## 2016-12-15 MED ORDER — TRAZODONE HCL 50 MG PO TABS: 50.0000 mg | ORAL_TABLET | Freq: Every day | ORAL | 11 refills | Status: AC

## 2016-12-15 NOTE — Assessment & Plan Note (Signed)
Patient was recently hospitalized from February 10 through February 12 with influenza B. He finished his course of Tamiflu. He feels much better, denies fever, chills, nasal congestion, nausea, vomiting. He does continue to have a mild cough which is improving.  Assessment: Resolved

## 2016-12-15 NOTE — Patient Instructions (Addendum)
Continue taking all medications as prescribed. I will send in a refill of the vitamin D and trazodone to [place] medication assistance program. Please follow up in 6 months, sooner if needed.

## 2016-12-15 NOTE — Assessment & Plan Note (Signed)
Patient is on trazodone 50 mg at night in addition to Zoloft 50 mg daily. He requests a refill of his trazodone   Plan: -Refilled trazadone 50 mg at night

## 2016-12-15 NOTE — Assessment & Plan Note (Signed)
Given degenerative changes and recurrent steroid injections, vitamin D level was checked which was low at 15.  He was prescribed 1000 units of vitamin D daily at previous visit. He states he was unable to get this medication from his family pharmacy due to cost. He prefers to get his medications if they have done at [facility] medication assistance program.  Plan:  Sent vitamin D 1000 units daily to [facility]

## 2016-12-15 NOTE — Assessment & Plan Note (Signed)
Patient does have diabetes and reports compliance with glipizide, sitagliptin, and Invokana, metformin. He follows with an endocrinologist. He also has an ophthalmologist Dr. Rober Minion at [facility]. He was seen there in fall 2017. He would prefer to get retinal scans here if he is able to.  Plan: -Continue current medications -Follow-up with endocrinology -Obtain records from ophthalmologist

## 2016-12-15 NOTE — Progress Notes (Signed)
Case discussed with Dr. Burns soon after the resident saw the patient. We reviewed the resident's history and exam and pertinent patient test results. I agree with the assessment, diagnosis, and plan of care documented in the resident's note. 

## 2016-12-15 NOTE — Progress Notes (Signed)
    CC: Follow-up for right hip pain  HPI: [name] is a 56 y.o. male with PMHx of obesity, hyperlipidemia, hypertension, CAD, type 2 diabetes who presents to the clinic for follow-up for right hip pain.   Patient was recently hospitalized from February 10 through February 12 with influenza B. He finished his course of Tamiflu. He feels much better, denies fever, chills, nasal congestion, nausea, vomiting. He does continue to have a mild cough which is improving.  Patient has a history of hypokalemia but reports compliance with his potassium replacement. Basic metabolic panel on February 14 revealed a normal potassium at 3.9.  Patient was diagnosed with vitamin D deficiency and prescribed 1000 units of vitamin D daily at previous visit. He states he was unable to get this medication from his family pharmacy due to cost. He prefers to get his medications if they have done at [facility] medication assistance program.  Patient does have diabetes and reports compliance with glipizide, sitagliptin, and Invokana, metformin. He follows with an endocrinologist. He also has an ophthalmologist Dr. Clinton Quant at [facility]. He was seen there in fall 2017. He would prefer to get retinal scans here if he is able to.  Patient has a history of acute on chronic right hip pain. He follows closely with source medicine in [facility]. He was last seen there on February 16. Given his radicular symptoms and right lower extremity weakness particularly with hip flexion, MRI was ordered. MRI will be completed on February 28. He has a follow-up appointment with them on March 9 to discuss further management which may include epidural injections, facet injections or possible hip replacement. Patient is not on NSAIDs due to history of bleeding ulcers. He also follows with pain management. He does not receive chronic opioid prescriptions from the internal medicine clinic.  Past Medical History:  Diagnosis  Date  . Arthritis   . Back pain   . CAD (coronary artery disease)    a. s/p DES to LAD in 05/2016  . Hyperlipidemia   . Hypertension   . IBS (irritable bowel syndrome)   . OSA (obstructive sleep apnea)   . Pancreatitis   . PUD (peptic ulcer disease)   . Type 2 diabetes mellitus ([place])      Review of Systems: Please see pertinent ROS reviewed in HPI and problem based charting.   Physical Exam: Vitals:   12/15/16 1026  BP: 139/62  Pulse: 97  Temp: 98.1 F (36.7 C)  TempSrc: Oral  SpO2: 97%  Weight: (!) 310 lb 3.2 oz (140.7 kg)  Height: 5\' 10"  (1.778 m)   General: Vital signs reviewed.  Patient is Obese in no acute distress and cooperative with exam.  Cardiovascular: RRR, S1 normal, S2 normal, no murmurs, gallops, or rubs. Pulmonary/Chest: Clear to auscultation bilaterally, no wheezes, rales, or rhonchi. Abdominal: Soft, non-tender, non-distended, BS +, obese.  Musculoskeletal: 3 out of 5 strength in right hip flexion, 5 out of 5 in right hip extension, 5 out of 5 right knee extension and flexion. 5 out of 5 strength in left lower extremity. Extremities: No lower extremity edema bilaterally Neurological: Sensory intact to light touch bilaterally in lower extremities.  Skin: Warm, dry and intact. No rashes or erythema. Psychiatric: Normal mood and affect. speech and behavior is normal. Cognition and memory are normal.   Assessment & Plan:  See encounters tab for problem based medical decision making. Patient discussed with Dr. Eppie Gibson

## 2016-12-15 NOTE — Assessment & Plan Note (Signed)
Patient has a history of acute on chronic right hip pain. He follows closely with source medicine in [facility]. He was last seen there on February 16. Given his radicular symptoms and right lower extremity weakness particularly with hip flexion, MRI was ordered. MRI will be completed on February 28. He has a follow-up appointment with them on March 9 to discuss further management which may include epidural injections, facet injections or possible hip replacement. Patient is not on NSAIDs due to history of bleeding ulcers. He also follows with pain management. He does not receive chronic opioid prescriptions from the internal medicine clinic.  Patient has 3 out of 5  Weakness in right hip flexion.   Plan: Follow-up wth sports medicine MRI on February 29   physical therapy

## 2016-12-15 NOTE — Assessment & Plan Note (Signed)
Patient has a history of hypokalemia but reports compliance with his potassium replacement. Basic metabolic panel on February 14 revealed a normal potassium at 3.9.   Plan: -Continue potassium replacement

## 2016-12-29 ENCOUNTER — Encounter: Payer: Self-pay | Admitting: Internal Medicine

## 2016-12-29 ENCOUNTER — Telehealth: Payer: Self-pay | Admitting: *Deleted

## 2016-12-29 ENCOUNTER — Ambulatory Visit (INDEPENDENT_AMBULATORY_CARE_PROVIDER_SITE_OTHER): Payer: Medicaid Other | Admitting: Internal Medicine

## 2016-12-29 DIAGNOSIS — G8929 Other chronic pain: Secondary | ICD-10-CM

## 2016-12-29 DIAGNOSIS — M5441 Lumbago with sciatica, right side: Secondary | ICD-10-CM | POA: Diagnosis not present

## 2016-12-29 DIAGNOSIS — M549 Dorsalgia, unspecified: Secondary | ICD-10-CM

## 2016-12-29 DIAGNOSIS — R200 Anesthesia of skin: Secondary | ICD-10-CM | POA: Diagnosis not present

## 2016-12-29 DIAGNOSIS — M546 Pain in thoracic spine: Secondary | ICD-10-CM

## 2016-12-29 DIAGNOSIS — Z79891 Long term (current) use of opiate analgesic: Secondary | ICD-10-CM

## 2016-12-29 MED ORDER — KETOROLAC TROMETHAMINE 30 MG/ML IJ SOLN
60.0000 mg | Freq: Once | INTRAMUSCULAR | Status: AC
  Administered 2016-12-29: 60 mg via INTRAMUSCULAR

## 2016-12-29 NOTE — Progress Notes (Signed)
CC: back pain  HPI:  [name] is a 56 y.o. with PMH as listed below is here with back pain.   Xray lspine 11/2016: Stable mild anterior spurring at L1-L2 level. Stable moderate anterior spurring at L3-L4 level. Mild anterior spurring upper endplate of L5. Stable mild disc space flattening at L5-S1 level. Mild facet degenerative changes at L4 and L5 level.   MRI lspine 2/28 1. Congenital and acquired spinal stenosis as described.  2. Moderate right foraminal stenosis at T12-L1  3. Mild left foraminal narrowing at L2-3.  4. Mild right subarticular narrowing at L3-4.  5. Moderate right and mild left foraminal stenosis at L3-4.  6. Moderate subarticular and foraminal narrowing bilaterally at  L4-5.  7. Mild subarticular narrowing and moderate foraminal stenosis  bilaterally at L5-S1, right greater than left.     On hydrocodone, robaxin, and neurontin. Pain has been gradually worsening and for last 2 days it has been unbearable. Could not go to his pain clinic due to transportation issues. Has spinal injection planned on March 22nd. Pain is not improving with his current meds. Has intermittent weakness and paralysis of right leg due in additional to his sciatica pain on this side. Also has chronic right hand numbness/tingling which was thought to be from spinal stenosis as well. He is following with ortho and sports medicine. They are still discussing options regarding PT and surgical intervention. He wants to give him a "shot" as he is tired of taking pills and not improving his pain. No bowel or bladder loss, fever, chills.     Past Medical History:  Diagnosis Date  . Arthritis   . Back pain   . CAD (coronary artery disease)    a. s/p DES to LAD in 05/2016  . Hyperlipidemia   . Hypertension   . IBS (irritable bowel syndrome)   . OSA (obstructive sleep apnea)   . Pancreatitis   . PUD (peptic ulcer disease)   . Type 2 diabetes mellitus ([place])     Review  of Systems:   Review of Systems  Constitutional: Negative for chills and fever.  Cardiovascular: Negative for chest pain and palpitations.  Gastrointestinal: Negative for abdominal pain, heartburn, nausea and vomiting.  Genitourinary: Negative for dysuria.  Musculoskeletal: Positive for back pain and joint pain.  Neurological: Positive for tingling and sensory change. Negative for dizziness and headaches.     Physical Exam:  Vitals:   12/29/16 1327  BP: (!) 144/76  Pulse: 80  Temp: 98.4 F (36.9 C)  TempSrc: Oral  SpO2: 97%  Weight: (!) 310 lb 9.6 oz (140.9 kg)  Height: 5\' 10"  (1.610 m)   Physical Exam  Constitutional: He is oriented to person, place, and time.  Obese male, NAD, sitting in chair.   HENT:  Head: Normocephalic and atraumatic.  Eyes: Conjunctivae are normal. Right eye exhibits no discharge.  Cardiovascular: Normal rate and regular rhythm.  Exam reveals no gallop and no friction rub.   No murmur heard. Respiratory: Effort normal and breath sounds normal. No respiratory distress. He has no wheezes.  Musculoskeletal:  Has tenderness over T and L spine. Has paraspinal muscle tenderness b/l on lower spine. Right leg ROM due to pain and weakness. ROM and str is movement in other areas.   Neurological: He is alert and oriented to person, place, and time.    Assessment & Plan:   See Encounters Tab for problem based charting.  Patient discussed with Dr. Eppie Gibson

## 2016-12-29 NOTE — Patient Instructions (Signed)
We gave you toradol shot today for the pain.  You need to get in the pain medicine clinic ASAP to further manage your pain.

## 2016-12-29 NOTE — Assessment & Plan Note (Signed)
Has back pain upper and lower and right leg sciatica and right finger numbness. MRI showed multilevel spinal stenosis. Has team of physicians who is managing this including ortho, sports medicine, and pain medicine clinic. On hydrocodone, robaxin, and neurontin. Has spinal steroid injection planned for later this month.  - will do toradol IM 60mg  today and continue his chronic med regimen. Asked him to get in pain clinic ASAP for further main management.

## 2016-12-29 NOTE — Telephone Encounter (Signed)
WALK IN PT STATES HE IS HAVING INCREASED PAIN TO HIS BACK, STATES HE GETS INJECTIONS AT PAIN CLINIC [place] BUT CANNOT GET TRANSPORTATION TO [place], wants to be seen today, ACC at 1315

## 2016-12-29 NOTE — Progress Notes (Signed)
Case discussed with Dr. Ahmed at the time of the visit.  We reviewed the resident's history and exam and pertinent patient test results.  I agree with the assessment, diagnosis and plan of care documented in the resident's note. 

## 2017-01-09 ENCOUNTER — Encounter (HOSPITAL_COMMUNITY): Payer: Self-pay | Admitting: Emergency Medicine

## 2017-01-09 ENCOUNTER — Emergency Department (HOSPITAL_COMMUNITY)
Admission: EM | Admit: 2017-01-09 | Discharge: 2017-01-09 | Disposition: A | Discharge status: Home / Self Care | Payer: Medicaid Other | Attending: Emergency Medicine | Admitting: Emergency Medicine

## 2017-01-09 DIAGNOSIS — I2510 Atherosclerotic heart disease of native coronary artery without angina pectoris: Secondary | ICD-10-CM | POA: Insufficient documentation

## 2017-01-09 DIAGNOSIS — Y999 Unspecified external cause status: Secondary | ICD-10-CM | POA: Insufficient documentation

## 2017-01-09 DIAGNOSIS — Z7982 Long term (current) use of aspirin: Secondary | ICD-10-CM | POA: Insufficient documentation

## 2017-01-09 DIAGNOSIS — S39012A Strain of muscle, fascia and tendon of lower back, initial encounter: Secondary | ICD-10-CM | POA: Insufficient documentation

## 2017-01-09 DIAGNOSIS — X58XXXA Exposure to other specified factors, initial encounter: Secondary | ICD-10-CM | POA: Insufficient documentation

## 2017-01-09 DIAGNOSIS — S3992XA Unspecified injury of lower back, initial encounter: Secondary | ICD-10-CM | POA: Diagnosis present

## 2017-01-09 DIAGNOSIS — I10 Essential (primary) hypertension: Secondary | ICD-10-CM | POA: Diagnosis not present

## 2017-01-09 DIAGNOSIS — Z79899 Other long term (current) drug therapy: Secondary | ICD-10-CM | POA: Insufficient documentation

## 2017-01-09 DIAGNOSIS — Z7984 Long term (current) use of oral hypoglycemic drugs: Secondary | ICD-10-CM | POA: Diagnosis not present

## 2017-01-09 DIAGNOSIS — Y929 Unspecified place or not applicable: Secondary | ICD-10-CM | POA: Diagnosis not present

## 2017-01-09 DIAGNOSIS — T148XXA Other injury of unspecified body region, initial encounter: Secondary | ICD-10-CM

## 2017-01-09 DIAGNOSIS — M545 Low back pain, unspecified: Secondary | ICD-10-CM

## 2017-01-09 DIAGNOSIS — Y939 Activity, unspecified: Secondary | ICD-10-CM | POA: Insufficient documentation

## 2017-01-09 MED ORDER — KETOROLAC TROMETHAMINE 30 MG/ML IJ SOLN
30.0000 mg | Freq: Once | INTRAMUSCULAR | Status: AC
  Administered 2017-01-09: 30 mg via INTRAMUSCULAR
  Filled 2017-01-09: qty 1

## 2017-01-09 NOTE — ED Triage Notes (Signed)
Pt sts lower back pain worse upon waking this am

## 2017-01-09 NOTE — ED Provider Notes (Signed)
[facility] DEPT Provider Note   CSN: [id number] Arrival date & time: 01/09/17  1017  By signing my name below, I, [name], attest that this documentation has been prepared under the direction and in the presence of [name], PA-C. Electronically Signed: Hansel Stein, ED Scribe. 01/09/17. 11:57 AM.    History   Chief Complaint Chief Complaint  Patient presents with  . Back Pain    HPI [name] is a 56 y.o. male with h/o spinal stenosis, sciatica who presents to the Emergency Department complaining of moderate, acute on chronic lower back pain with radiation to the right leg to the knee that began this morning. Per pt, his pain worsened this morning upon getting out of bed and standing up straight. He reports after about 2 hours of trying to stretch his back, he also felt a "pop". Pt denies recent trauma, injury, heavy lifting, falls, twisting. Pt also reports some numbness to the medial aspect of his right thigh. Pt is ambulatory with minimal difficulty with a cane per his baseline. Pt states his pain is worsened in certain positions and with standing up straight. Pt states he has Robaxin at home, but it did not provide relief of his pain today. Pt reports his PCP normally gives him IM Toradol with significant relief of his pain. He has f/u on the 29th of this month with sports medicine for spinal injections. He denies bowel or bladder incontinence, saddle anesthesia, dysuria, hematuria, frequency. He also denies focal weakness or paresthesia of the lower extremities.    The history is provided by the patient. No language interpreter was used.    Past Medical History:  Diagnosis Date  . Arthritis   . Back pain   . CAD (coronary artery disease)    a. s/p DES to LAD in 05/2016  . Hyperlipidemia   . Hypertension   . IBS (irritable bowel syndrome)   . OSA (obstructive sleep apnea)   . Pancreatitis   . PUD (peptic ulcer disease)   . Type 2 diabetes mellitus [facility])      Patient Active Problem List   Diagnosis Date Noted  . Chronic back pain 12/29/2016  . Depression 12/15/2016  . Vitamin D deficiency 12/09/2016  . Right hip pain 12/07/2016  . Hypokalemia 12/07/2016  . Influenza B 12/04/2016  . Type 2 diabetes mellitus with vascular disease ([name]) 05/31/2016  . Normocytic normochromic anemia 05/31/2016  . CAD (coronary artery disease) 01/06/2016  . DVT (deep venous thrombosis) ([name]) 01/06/2016  . Lactic acidosis 05/28/2014  . Nonspecific chest pain 01/29/2014  . Uncontrolled secondary diabetes with peripheral neuropathy ([name]) 01/29/2014  . Morbid obesity ([name]) 01/29/2014  . Snoring 01/29/2014  . Dyslipidemia 01/29/2014  . HTN (hypertension) 01/29/2014  . Abnormal nuclear stress test 01/29/2014    Past Surgical History:  Procedure Laterality Date  . CARDIAC CATHETERIZATION N/A 05/31/2016   Procedure: Left Heart Cath and Coronary Angiography;  Surgeon: Peter M Martinique, MD;  Location: [place] CV LAB;  Service: Cardiovascular;  Laterality: N/A;  . CARDIAC CATHETERIZATION N/A 05/31/2016   Procedure: Intravascular Pressure Wire/FFR Study;  Surgeon: Peter M Martinique, MD;  Location: [place] CV LAB;  Service: Cardiovascular;  Laterality: N/A;  . CARDIAC CATHETERIZATION N/A 05/31/2016   Procedure: Coronary Stent Intervention;  Surgeon: Peter M Martinique, MD;  Location: [place] CV LAB;  Service: Cardiovascular;  Laterality: N/A;  . LEFT HEART CATHETERIZATION WITH CORONARY ANGIOGRAM N/A 02/03/2014   Procedure: LEFT HEART CATHETERIZATION WITH CORONARY ANGIOGRAM;  Surgeon: Pixie Casino, MD;  Location: [facility] CATH LAB;  Service: Cardiovascular;  Laterality: N/A;  . left leg stent          Home Medications    Prior to Admission medications   Medication Sig Start Date End Date Taking? Authorizing Provider  aspirin 81 MG chewable tablet Chew 1 tablet (81 mg total) by mouth daily. 06/01/16   Elwin Mocha, MD  atorvastatin (LIPITOR) 80 MG tablet Take 1  tablet (80 mg total) by mouth daily. 01/06/16   Lelon Perla, MD  benzonatate (TESSALON PERLES) 100 MG capsule Take 2 capsules (200 mg total) by mouth 2 (two) times daily. 12/05/16   Alphonzo Grieve, MD  canagliflozin (INVOKANA) 100 MG TABS tablet Take 1 tablet (100 mg total) by mouth daily before breakfast. 12/01/16   Ledell Noss, MD  carvedilol (COREG) 25 MG tablet Take 25 mg by mouth 2 (two) times daily with a meal.    Historical Provider, MD  cholecalciferol (VITAMIN D) 1000 units tablet Take 1 tablet (1,000 Units total) by mouth daily. 12/15/16   Florinda Marker, MD  clopidogrel (PLAVIX) 75 MG tablet Take 75 mg by mouth daily.    Historical Provider, MD  famotidine (PEPCID) 20 MG tablet Take 20 mg by mouth 2 (two) times daily.    Historical Provider, MD  furosemide (LASIX) 40 MG tablet Take 40 mg by mouth 2 (two) times daily.    Historical Provider, MD  gabapentin (NEURONTIN) 300 MG capsule Take 600 mg by mouth 4 (four) times daily.     Historical Provider, MD  glipiZIDE (GLUCOTROL) 10 MG tablet Take 1 tablet by mouth 2 (two) times daily. 11/13/15   Historical Provider, MD  hydrALAZINE (APRESOLINE) 50 MG tablet Take 50 mg by mouth 2 (two) times daily.    Historical Provider, MD  isosorbide dinitrate (ISORDIL) 30 MG tablet Take 1 tablet (30 mg total) by mouth 2 (two) times daily. 11/22/16   Erma Heritage, PA  menthol-cetylpyridinium (CEPACOL) 3 MG lozenge Take 1 lozenge (3 mg total) by mouth as needed for sore throat. 12/05/16   Alphonzo Grieve, MD  metFORMIN (GLUCOPHAGE) 1000 MG tablet Take 1,000 mg by mouth 2 (two) times daily with a meal.    Historical Provider, MD  methocarbamol (ROBAXIN) 750 MG tablet Take 750 mg by mouth 3 (three) times daily as needed for muscle spasms.  10/12/16   Historical Provider, MD  oseltamivir (TAMIFLU) 75 MG capsule Take 1 capsule (75 mg total) by mouth 2 (two) times daily. 12/05/16   Alphonzo Grieve, MD  pantoprazole (PROTONIX) 20 MG tablet Take 20 mg by mouth daily.   10/04/16   Historical Provider, MD  potassium chloride SA (K-DUR,KLOR-CON) 20 MEQ tablet Take 20 mEq by mouth daily.    Historical Provider, MD  sertraline (ZOLOFT) 50 MG tablet Take 50 mg by mouth daily.    Historical Provider, MD  sitaGLIPtin (JANUVIA) 100 MG tablet Take 100 mg by mouth daily.    Historical Provider, MD  traZODone (DESYREL) 50 MG tablet Take 1 tablet (50 mg total) by mouth at bedtime. 12/15/16   Florinda Marker, MD    Family History Family History  Problem Relation Age of Onset  . Cancer Father   . Hypertension Mother   . Diabetes Mother   . Breast cancer Mother   . Hypertension Brother   . Diabetes Brother   . Hypertension Sister   . Diabetes Sister     Social History Social  History  Substance Use Topics  . Smoking status: Never Smoker  . Smokeless tobacco: Never Used  . Alcohol use No     Allergies   Coconut oil and Ibuprofen   Review of Systems Review of Systems  Respiratory: Negative for cough.   Gastrointestinal:       Negative for bowel incontinence.   Genitourinary: Negative for dysuria, frequency and hematuria.       Negative for bladder incontinence and saddle anesthesia.   Musculoskeletal: Positive for back pain.  Neurological: Positive for numbness. Negative for weakness.       Negative for paresthesias.      Physical Exam Updated Vital Signs BP (!) 157/80 (BP Location: Right Arm)   Pulse 86   Temp 98.8 F (37.1 C) (Oral)   Resp 18   SpO2 95%   Physical Exam  Constitutional: He is oriented to person, place, and time. He appears well-developed and well-nourished. No distress.  HENT:  Head: Normocephalic and atraumatic.  Mouth/Throat: Oropharynx is clear and moist.  Eyes: Conjunctivae are normal.  Neck: Normal range of motion. Neck supple. No spinous process tenderness and no muscular tenderness present.  Cardiovascular: Normal rate, regular rhythm and normal heart sounds.   Pulmonary/Chest: Effort normal and breath sounds  normal. No respiratory distress.  Abdominal: He exhibits no distension.  Musculoskeletal: Normal range of motion. He exhibits tenderness. He exhibits no edema.  Right lumbar musculature tenderness. No midline spinous process tenderness.   Neurological: He is alert and oriented to person, place, and time. He has normal strength.  Strength lower extremities 5/5 and equal bilateral. Sensation intact. Antalgic gait.  Skin: Skin is warm and dry. No rash noted. He is not diaphoretic.  Psychiatric: He has a normal mood and affect. His behavior is normal.  Nursing note and vitals reviewed.  ED Treatments / Results   DIAGNOSTIC STUDIES: Oxygen Saturation is 95% on RA, adequate by my interpretation.    COORDINATION OF CARE: 11:51 AM Discussed treatment plan with pt at bedside which includes IM Toradol and pt agreed to plan.    Labs (all labs ordered are listed, but only abnormal results are displayed) Labs Reviewed - No data to display  EKG  EKG Interpretation None       Radiology No results found.  Procedures Procedures (including critical care time)  Medications Ordered in ED Medications  ketorolac (TORADOL) 30 MG/ML injection 30 mg (not administered)     Initial Impression / Assessment and Plan / ED Course  I have reviewed the triage vital signs and the nursing notes.  Pertinent labs & imaging results that were available during my care of the patient were reviewed by me and considered in my medical decision making (see chart for details).     I have reviewed the relevant previous healthcare records. I obtained HPI from historian.  ED Course: IM Toradol   Assessment: Patient is a 56 y.o. male with a hx of spinal stenosis, sciatica who presents to the ED with back pain. No neurological deficits appreciated. Patient is ambulatory. No warning symptoms of back pain including: fecal incontinence, urinary retention or overflow incontinence, night sweats, waking from sleep  with back pain, unexplained fevers or weight loss, h/o cancer, IVDU, recent trauma. No concern for cauda equina, epidural abscess, or other serious cause of back pain. Conservative measures such as rest, ice/heat and pain medicine indicated with PCP follow-up if no improvement with conservative management.    Disposition/Plan:  Pt given IM Toradol  in the ED. Additional Verbal discharge instructions given and discussed with patient. Pt Instructed to f/u with PCP in the next week for evaluation and treatment of symptoms. Pt advised to keep f/u appointments with his specialists for further management of chronic pain.  Return precautions given Pt acknowledges and agrees with plan  Supervising Physician Gareth Morgan, MD    Final Clinical Impressions(s) / ED Diagnoses   Final diagnoses:  Acute bilateral low back pain without sciatica  Muscle strain    New Prescriptions New Prescriptions   No medications on file    I personally performed the services described in this documentation, which was scribed in my presence. The recorded information has been reviewed and is accurate.    [name], PA-C 01/09/17 1214    Gareth Morgan, MD 01/09/17 [phone]

## 2017-01-09 NOTE — Discharge Instructions (Signed)
Please read and follow all provided instructions.  Your diagnoses today include:  1. Acute bilateral low back pain without sciatica   2. Muscle strain     Tests performed today include: Vital signs - see below for your results today  Medications prescribed:   Take any prescribed medications only as directed.  Home care instructions:  Follow any educational materials contained in this packet Please rest, use ice or heat on your back for the next several days Do not lift, push, pull anything more than 10 pounds for the next week  Follow-up instructions: Please follow-up with your primary care provider in the next 1 week for further evaluation of your symptoms.   Return instructions:  SEEK IMMEDIATE MEDICAL ATTENTION IF YOU HAVE: New numbness, tingling, weakness, or problem with the use of your arms or legs Severe back pain not relieved with medications Loss control of your bowels or bladder Increasing pain in any areas of the body (such as chest or abdominal pain) Shortness of breath, dizziness, or fainting.  Worsening nausea (feeling sick to your stomach), vomiting, fever, or sweats Any other emergent concerns regarding your health   Additional Information:  Your vital signs today were: BP (!) 157/80 (BP Location: Right Arm)    Pulse 86    Temp 98.8 F (37.1 C) (Oral)    Resp 18    SpO2 95%  If your blood pressure (BP) was elevated above 135/85 this visit, please have this repeated by your doctor within one month. --------------

## 2017-01-15 ENCOUNTER — Emergency Department (HOSPITAL_COMMUNITY)
Admission: EM | Admit: 2017-01-15 | Discharge: 2017-01-15 | Disposition: A | Discharge status: Home / Self Care | Payer: Medicaid Other | Attending: Emergency Medicine | Admitting: Emergency Medicine

## 2017-01-15 ENCOUNTER — Encounter (HOSPITAL_COMMUNITY): Payer: Self-pay | Admitting: Emergency Medicine

## 2017-01-15 DIAGNOSIS — Z7982 Long term (current) use of aspirin: Secondary | ICD-10-CM | POA: Insufficient documentation

## 2017-01-15 DIAGNOSIS — M5441 Lumbago with sciatica, right side: Secondary | ICD-10-CM | POA: Diagnosis not present

## 2017-01-15 DIAGNOSIS — Z7984 Long term (current) use of oral hypoglycemic drugs: Secondary | ICD-10-CM | POA: Diagnosis not present

## 2017-01-15 DIAGNOSIS — I10 Essential (primary) hypertension: Secondary | ICD-10-CM | POA: Insufficient documentation

## 2017-01-15 DIAGNOSIS — M545 Low back pain: Secondary | ICD-10-CM | POA: Diagnosis present

## 2017-01-15 DIAGNOSIS — Z79899 Other long term (current) drug therapy: Secondary | ICD-10-CM | POA: Insufficient documentation

## 2017-01-15 DIAGNOSIS — I2510 Atherosclerotic heart disease of native coronary artery without angina pectoris: Secondary | ICD-10-CM | POA: Insufficient documentation

## 2017-01-15 DIAGNOSIS — G8929 Other chronic pain: Secondary | ICD-10-CM | POA: Insufficient documentation

## 2017-01-15 DIAGNOSIS — E119 Type 2 diabetes mellitus without complications: Secondary | ICD-10-CM | POA: Insufficient documentation

## 2017-01-15 MED ORDER — HYDROCODONE-ACETAMINOPHEN 5-325 MG PO TABS
1.0000 | ORAL_TABLET | Freq: Once | ORAL | Status: AC
Start: 2017-01-15 — End: 2017-01-15
  Administered 2017-01-15: 1 via ORAL
  Filled 2017-01-15: qty 1

## 2017-01-15 MED ORDER — KETOROLAC TROMETHAMINE 60 MG/2ML IM SOLN
60.0000 mg | Freq: Once | INTRAMUSCULAR | Status: AC
  Administered 2017-01-15: 60 mg via INTRAMUSCULAR
  Filled 2017-01-15: qty 2

## 2017-01-15 NOTE — ED Notes (Signed)
Declined W/C at D/C and was escorted to lobby by RN. 

## 2017-01-15 NOTE — ED Provider Notes (Signed)
[facility] DEPT Provider Note   CSN: [id number] Arrival date & time: 01/15/17  1023  By signing my name below, I, [name], attest that this documentation has been prepared under the direction and in the presence of [employer], PA-C. Electronically Signed: Sonum Stein, Education administrator. 01/15/17. 11:16 AM.  History   Chief Complaint Chief Complaint  Patient presents with  . Back Pain    The history is provided by the patient. No language interpreter was used.     HPI Comments: [name] is a 56 y.o. male who presents to the Emergency Department complaining of chronic, ongoing right sided back pain that has worsened over the last few days. He has associated weakness to the right lower leg due to pain and numbness to the right thigh which is chronic. He reports having an appointment with ortho due to spinal stenosis on 01/19/17 but states he has been unable to deal with the pain is requesting a shot. He denies any new injuries or trauma to the affected area. He is able to ambulate with a cane. He was seen on 01/09/17 for the same symptoms. He denies bowel/bladder incontinence, saddle anesthesia.    Past Medical History:  Diagnosis Date  . Arthritis   . Back pain   . CAD (coronary artery disease)    a. s/p DES to LAD in 05/2016  . Hyperlipidemia   . Hypertension   . IBS (irritable bowel syndrome)   . OSA (obstructive sleep apnea)   . Pancreatitis   . PUD (peptic ulcer disease)   . Type 2 diabetes mellitus [facility])     Patient Active Problem List   Diagnosis Date Noted  . Chronic back pain 12/29/2016  . Depression 12/15/2016  . Vitamin D deficiency 12/09/2016  . Right hip pain 12/07/2016  . Hypokalemia 12/07/2016  . Influenza B 12/04/2016  . Type 2 diabetes mellitus with vascular disease ([place]) 05/31/2016  . Normocytic normochromic anemia 05/31/2016  . CAD (coronary artery disease) 01/06/2016  . DVT (deep venous thrombosis) ([place]) 01/06/2016  . Lactic acidosis 05/28/2014  .  Nonspecific chest pain 01/29/2014  . Uncontrolled secondary diabetes with peripheral neuropathy ([place]) 01/29/2014  . Morbid obesity ([place]) 01/29/2014  . Snoring 01/29/2014  . Dyslipidemia 01/29/2014  . HTN (hypertension) 01/29/2014  . Abnormal nuclear stress test 01/29/2014    Past Surgical History:  Procedure Laterality Date  . CARDIAC CATHETERIZATION N/A 05/31/2016   Procedure: Left Heart Cath and Coronary Angiography;  Surgeon: Peter M Martinique, MD;  Location: [facility] CV LAB;  Service: Cardiovascular;  Laterality: N/A;  . CARDIAC CATHETERIZATION N/A 05/31/2016   Procedure: Intravascular Pressure Wire/FFR Study;  Surgeon: Peter M Martinique, MD;  Location: [facility] CV LAB;  Service: Cardiovascular;  Laterality: N/A;  . CARDIAC CATHETERIZATION N/A 05/31/2016   Procedure: Coronary Stent Intervention;  Surgeon: Peter M Martinique, MD;  Location: [facility] CV LAB;  Service: Cardiovascular;  Laterality: N/A;  . LEFT HEART CATHETERIZATION WITH CORONARY ANGIOGRAM N/A 02/03/2014   Procedure: LEFT HEART CATHETERIZATION WITH CORONARY ANGIOGRAM;  Surgeon: Pixie Casino, MD;  Location: [facility] CATH LAB;  Service: Cardiovascular;  Laterality: N/A;  . left leg stent          Home Medications    Prior to Admission medications   Medication Sig Start Date End Date Taking? Authorizing Provider  aspirin 81 MG chewable tablet Chew 1 tablet (81 mg total) by mouth daily. 06/01/16   Elwin Mocha, MD  atorvastatin (LIPITOR) 80 MG tablet  Take 1 tablet (80 mg total) by mouth daily. 01/06/16   Lelon Perla, MD  benzonatate (TESSALON PERLES) 100 MG capsule Take 2 capsules (200 mg total) by mouth 2 (two) times daily. 12/05/16   Alphonzo Grieve, MD  canagliflozin (INVOKANA) 100 MG TABS tablet Take 1 tablet (100 mg total) by mouth daily before breakfast. 12/01/16   Ledell Noss, MD  carvedilol (COREG) 25 MG tablet Take 25 mg by mouth 2 (two) times daily with a meal.    Historical Provider, MD  cholecalciferol (VITAMIN D) 1000  units tablet Take 1 tablet (1,000 Units total) by mouth daily. 12/15/16   Florinda Marker, MD  clopidogrel (PLAVIX) 75 MG tablet Take 75 mg by mouth daily.    Historical Provider, MD  famotidine (PEPCID) 20 MG tablet Take 20 mg by mouth 2 (two) times daily.    Historical Provider, MD  furosemide (LASIX) 40 MG tablet Take 40 mg by mouth 2 (two) times daily.    Historical Provider, MD  gabapentin (NEURONTIN) 300 MG capsule Take 600 mg by mouth 4 (four) times daily.     Historical Provider, MD  glipiZIDE (GLUCOTROL) 10 MG tablet Take 1 tablet by mouth 2 (two) times daily. 11/13/15   Historical Provider, MD  hydrALAZINE (APRESOLINE) 50 MG tablet Take 50 mg by mouth 2 (two) times daily.    Historical Provider, MD  isosorbide dinitrate (ISORDIL) 30 MG tablet Take 1 tablet (30 mg total) by mouth 2 (two) times daily. 11/22/16   Erma Heritage, PA  menthol-cetylpyridinium (CEPACOL) 3 MG lozenge Take 1 lozenge (3 mg total) by mouth as needed for sore throat. 12/05/16   Alphonzo Grieve, MD  metFORMIN (GLUCOPHAGE) 1000 MG tablet Take 1,000 mg by mouth 2 (two) times daily with a meal.    Historical Provider, MD  methocarbamol (ROBAXIN) 750 MG tablet Take 750 mg by mouth 3 (three) times daily as needed for muscle spasms.  10/12/16   Historical Provider, MD  oseltamivir (TAMIFLU) 75 MG capsule Take 1 capsule (75 mg total) by mouth 2 (two) times daily. 12/05/16   Alphonzo Grieve, MD  pantoprazole (PROTONIX) 20 MG tablet Take 20 mg by mouth daily.  10/04/16   Historical Provider, MD  potassium chloride SA (K-DUR,KLOR-CON) 20 MEQ tablet Take 20 mEq by mouth daily.    Historical Provider, MD  sertraline (ZOLOFT) 50 MG tablet Take 50 mg by mouth daily.    Historical Provider, MD  sitaGLIPtin (JANUVIA) 100 MG tablet Take 100 mg by mouth daily.    Historical Provider, MD  traZODone (DESYREL) 50 MG tablet Take 1 tablet (50 mg total) by mouth at bedtime. 12/15/16   Florinda Marker, MD    Family History Family History  Problem  Relation Age of Onset  . Cancer Father   . Hypertension Mother   . Diabetes Mother   . Breast cancer Mother   . Hypertension Brother   . Diabetes Brother   . Hypertension Sister   . Diabetes Sister     Social History Social History  Substance Use Topics  . Smoking status: Never Smoker  . Smokeless tobacco: Never Used  . Alcohol use No     Allergies   Coconut oil and Ibuprofen   Review of Systems Review of Systems  Musculoskeletal: Positive for back pain.  Neurological: Positive for weakness and numbness.     Physical Exam Updated Vital Signs BP 116/60 (BP Location: Left Arm)   Pulse 79   Temp 98 F (  36.7 C) (Oral)   Resp 20   SpO2 98%   Physical Exam  Constitutional: He is oriented to person, place, and time. He appears well-developed and well-nourished.  HENT:  Head: Normocephalic and atraumatic.  Cardiovascular: Normal rate.   Pulmonary/Chest: Effort normal.  Musculoskeletal: He exhibits tenderness. He exhibits no deformity.  Diffuse midline tenderness and right sided lumbar paraspinal muscle tenderness. Subjective numbness of right leg to knee. 5/5 strength bilaterally. Ambulatory with a cane.   Neurological: He is alert and oriented to person, place, and time.  Skin: Skin is warm and dry.  Psychiatric: He has a normal mood and affect.  Nursing note and vitals reviewed.    ED Treatments / Results  DIAGNOSTIC STUDIES: Oxygen Saturation is 98% on RA, normal by my interpretation.    COORDINATION OF CARE: 11:10 AM Discussed treatment plan with pt at bedside and pt agreed to plan.   Labs (all labs ordered are listed, but only abnormal results are displayed) Labs Reviewed - No data to display  EKG  EKG Interpretation None       Radiology No results found.  Procedures Procedures (including critical care time)  Medications Ordered in ED Medications  ketorolac (TORADOL) injection 60 mg (60 mg Intramuscular Given 01/15/17 1129)    HYDROcodone-acetaminophen (NORCO/VICODIN) 5-325 MG per tablet 1 tablet (1 tablet Oral Given 01/15/17 1129)     Initial Impression / Assessment and Plan / ED Course  I have reviewed the triage vital signs and the nursing notes.  Pertinent labs & imaging results that were available during my care of the patient were reviewed by me and considered in my medical decision making (see chart for details).  Patient with chronic back pain.  No neurological deficits and normal neuro exam.  Patient is ambulatory.  No red flags. Dose of norco given in ED as well as Toradol shot. Advised he cannot get rx for pain medicine for chronic pain. He verbalized understanding. Supportive care and return precaution discussed. Appears safe for discharge at this time. Follow up as indicated in discharge paperwork.   Final Clinical Impressions(s) / ED Diagnoses   Final diagnoses:  Chronic right-sided low back pain with right-sided sciatica    New Prescriptions New Prescriptions   No medications on file   I personally performed the services described in this documentation, which was scribed in my presence. The recorded information has been reviewed and is accurate.     Recardo Evangelist, PA-C 01/17/17 [place], MD 01/17/17 2005

## 2017-01-15 NOTE — ED Notes (Signed)
PA in to see pt. 

## 2017-01-15 NOTE — Discharge Instructions (Signed)
Please follow up with your doctor on the 29th Return for worsening symptoms

## 2017-01-15 NOTE — ED Triage Notes (Signed)
Bad back pain since yesterday states has run out of pain med  He wants a shot, hurts to lay down , sees his dr on the 29

## 2017-01-19 DIAGNOSIS — M48061 Spinal stenosis, lumbar region without neurogenic claudication: Secondary | ICD-10-CM | POA: Insufficient documentation

## 2017-01-22 ENCOUNTER — Emergency Department (HOSPITAL_COMMUNITY): Payer: Medicaid Other

## 2017-01-22 ENCOUNTER — Encounter (HOSPITAL_COMMUNITY): Payer: Self-pay | Admitting: Emergency Medicine

## 2017-01-22 ENCOUNTER — Emergency Department (HOSPITAL_COMMUNITY)
Admission: EM | Admit: 2017-01-22 | Discharge: 2017-01-22 | Disposition: A | Discharge status: Home / Self Care | Payer: Medicaid Other | Attending: Emergency Medicine | Admitting: Emergency Medicine

## 2017-01-22 DIAGNOSIS — R51 Headache: Secondary | ICD-10-CM | POA: Diagnosis not present

## 2017-01-22 DIAGNOSIS — I10 Essential (primary) hypertension: Secondary | ICD-10-CM | POA: Diagnosis not present

## 2017-01-22 DIAGNOSIS — I2510 Atherosclerotic heart disease of native coronary artery without angina pectoris: Secondary | ICD-10-CM | POA: Insufficient documentation

## 2017-01-22 DIAGNOSIS — E119 Type 2 diabetes mellitus without complications: Secondary | ICD-10-CM | POA: Diagnosis not present

## 2017-01-22 DIAGNOSIS — K529 Noninfective gastroenteritis and colitis, unspecified: Secondary | ICD-10-CM | POA: Insufficient documentation

## 2017-01-22 DIAGNOSIS — Z7984 Long term (current) use of oral hypoglycemic drugs: Secondary | ICD-10-CM | POA: Insufficient documentation

## 2017-01-22 DIAGNOSIS — R109 Unspecified abdominal pain: Secondary | ICD-10-CM | POA: Diagnosis present

## 2017-01-22 DIAGNOSIS — Z79899 Other long term (current) drug therapy: Secondary | ICD-10-CM | POA: Insufficient documentation

## 2017-01-22 DIAGNOSIS — Z7982 Long term (current) use of aspirin: Secondary | ICD-10-CM | POA: Insufficient documentation

## 2017-01-22 DIAGNOSIS — R42 Dizziness and giddiness: Secondary | ICD-10-CM

## 2017-01-22 DIAGNOSIS — R519 Headache, unspecified: Secondary | ICD-10-CM

## 2017-01-22 HISTORY — DX: Other pulmonary embolism without acute cor pulmonale: I26.99

## 2017-01-22 LAB — DIFFERENTIAL
Basophils Absolute: 0 10*3/uL (ref 0.0–0.1)
Basophils Relative: 0 %
Eosinophils Absolute: 0 10*3/uL (ref 0.0–0.7)
Eosinophils Relative: 0 %
Lymphocytes Relative: 23 %
Lymphs Abs: 3 10*3/uL (ref 0.7–4.0)
Monocytes Absolute: 1 10*3/uL (ref 0.1–1.0)
Monocytes Relative: 8 %
Neutro Abs: 9.4 10*3/uL — ABNORMAL HIGH (ref 1.7–7.7)
Neutrophils Relative %: 69 %

## 2017-01-22 LAB — PROTIME-INR
INR: 1
Prothrombin Time: 13.2 seconds (ref 11.4–15.2)

## 2017-01-22 LAB — ETHANOL: Alcohol, Ethyl (B): <5 mg/dL (ref <5)

## 2017-01-22 LAB — URINALYSIS, ROUTINE W REFLEX MICROSCOPIC
Bacteria, UA: NONE SEEN
Bilirubin Urine: NEGATIVE
Glucose, UA: >=500 mg/dL — AB
Hgb urine dipstick: NEGATIVE
Ketones, ur: NEGATIVE mg/dL
Leukocytes, UA: NEGATIVE
Nitrite: NEGATIVE
Protein, ur: NEGATIVE mg/dL
Specific Gravity, Urine: 1.013 (ref 1.005–1.030)
Squamous Epithelial / LPF: NONE SEEN
pH: 5 (ref 5.0–8.0)

## 2017-01-22 LAB — I-STAT CHEM 8, ED
BUN: 18 mg/dL (ref 6–20)
Calcium, Ion: 1.1 mmol/L — ABNORMAL LOW (ref 1.15–1.40)
Chloride: 101 mmol/L (ref 101–111)
Creatinine, Ser: 0.6 mg/dL — ABNORMAL LOW (ref 0.61–1.24)
Glucose, Bld: 205 mg/dL — ABNORMAL HIGH (ref 65–99)
HCT: 38 % — ABNORMAL LOW (ref 39.0–52.0)
Hemoglobin: 12.9 g/dL — ABNORMAL LOW (ref 13.0–17.0)
Potassium: 3.3 mmol/L — ABNORMAL LOW (ref 3.5–5.1)
Sodium: 140 mmol/L (ref 135–145)
TCO2: 27 mmol/L (ref 0–100)

## 2017-01-22 LAB — CBC
HCT: 36.9 % — ABNORMAL LOW (ref 39.0–52.0)
Hemoglobin: 12 g/dL — ABNORMAL LOW (ref 13.0–17.0)
MCH: 29.4 pg (ref 26.0–34.0)
MCHC: 32.5 g/dL (ref 30.0–36.0)
MCV: 90.4 fL (ref 78.0–100.0)
Platelets: 312 10*3/uL (ref 150–400)
RBC: 4.08 MIL/uL — ABNORMAL LOW (ref 4.22–5.81)
RDW: 13.9 % (ref 11.5–15.5)
WBC: 13.5 10*3/uL — ABNORMAL HIGH (ref 4.0–10.5)

## 2017-01-22 LAB — COMPREHENSIVE METABOLIC PANEL
ALT: 15 U/L — ABNORMAL LOW (ref 17–63)
AST: 18 U/L (ref 15–41)
Albumin: 4.1 g/dL (ref 3.5–5.0)
Alkaline Phosphatase: 37 U/L — ABNORMAL LOW (ref 38–126)
Anion gap: 14 (ref 5–15)
BUN: 16 mg/dL (ref 6–20)
CO2: 23 mmol/L (ref 22–32)
Calcium: 9.4 mg/dL (ref 8.9–10.3)
Chloride: 101 mmol/L (ref 101–111)
Creatinine, Ser: 0.79 mg/dL (ref 0.61–1.24)
GFR calc Af Amer: >60 mL/min (ref >60)
GFR calc non Af Amer: >60 mL/min (ref >60)
Glucose, Bld: 197 mg/dL — ABNORMAL HIGH (ref 65–99)
Potassium: 3.4 mmol/L — ABNORMAL LOW (ref 3.5–5.1)
Sodium: 138 mmol/L (ref 135–145)
Total Bilirubin: 0.7 mg/dL (ref 0.3–1.2)
Total Protein: 7.6 g/dL (ref 6.5–8.1)

## 2017-01-22 LAB — RAPID URINE DRUG SCREEN, HOSP PERFORMED
Amphetamines: NOT DETECTED
Barbiturates: NOT DETECTED
Benzodiazepines: NOT DETECTED
Cocaine: NOT DETECTED
Opiates: POSITIVE — AB
Tetrahydrocannabinol: NOT DETECTED

## 2017-01-22 LAB — I-STAT TROPONIN, ED: Troponin i, poc: 0 ng/mL (ref 0.00–0.08)

## 2017-01-22 LAB — LIPASE, BLOOD: Lipase: 16 U/L (ref 11–51)

## 2017-01-22 LAB — APTT: aPTT: 29 seconds (ref 24–36)

## 2017-01-22 LAB — CBG MONITORING, ED: Glucose-Capillary: 200 mg/dL — ABNORMAL HIGH (ref 65–99)

## 2017-01-22 MED ORDER — IOPAMIDOL (ISOVUE-300) INJECTION 61%
INTRAVENOUS | Status: AC
  Administered 2017-01-22: 100 mL
  Filled 2017-01-22: qty 100

## 2017-01-22 MED ORDER — PANTOPRAZOLE SODIUM 20 MG PO TBEC: 20.0000 mg | DELAYED_RELEASE_TABLET | Freq: Two times a day (BID) | ORAL | 0 refills | Status: DC

## 2017-01-22 MED ORDER — SODIUM CHLORIDE 0.9 % IV BOLUS (SEPSIS)
1000.0000 mL | Freq: Once | INTRAVENOUS | Status: AC
  Administered 2017-01-22: 1000 mL via INTRAVENOUS

## 2017-01-22 MED ORDER — ONDANSETRON HCL 4 MG/2ML IJ SOLN: 4.0000 mg | Freq: Once | INTRAMUSCULAR | Status: DC

## 2017-01-22 NOTE — ED Triage Notes (Addendum)
STATES WAS AT [place] H/A AND GOT DIZZY , IS ON PLAVIX FOR  dvt  FEELS LIKE HIS SUGAR WAS HIGH TODAY , feels tired , pt has equal grip and smile  Has no other deficits

## 2017-01-22 NOTE — ED Notes (Signed)
Pt understood dc material. NAD noted. Scripts given at dc 

## 2017-01-22 NOTE — ED Notes (Signed)
Pt insist he must stand to void. Assisted x 2

## 2017-01-22 NOTE — ED Notes (Signed)
Pt ambulatory to restroom with steady gait  Denies dizziness at this time

## 2017-01-22 NOTE — ED Provider Notes (Signed)
[facility] DEPT Provider Note   CSN: [id number] Arrival date & time: 01/22/17  1140     History   Chief Complaint Chief Complaint  Patient presents with  . Headache  . Dizziness    HPI JOSEFF LUCKMAN is a 56 y.o. male.  HPI Patient presents with dizziness which he describes as lightheadedness. He also is having generalized weakness and fatigue. This started while he was in church this morning. He complains of frontal headache with nausea. No vomiting. Patient has chronic abdominal pain which she states unchanged. Had loose bowel movement earlier this morning. No blood in stool. Denies any fever or chills. Denies any speech changes though he states his vision is blurred. States he felt like his blood sugar was elevated. Take his normal medications this morning. Past Medical History:  Diagnosis Date  . Arthritis   . Back pain   . CAD (coronary artery disease)    a. s/p DES to LAD in 05/2016  . Hyperlipidemia   . Hypertension   . IBS (irritable bowel syndrome)   . OSA (obstructive sleep apnea)   . Pancreatitis   . PE (pulmonary thromboembolism) ([place])   . PUD (peptic ulcer disease)   . Type 2 diabetes mellitus [facility])     Patient Active Problem List   Diagnosis Date Noted  . Chronic back pain 12/29/2016  . Depression 12/15/2016  . Vitamin D deficiency 12/09/2016  . Right hip pain 12/07/2016  . Hypokalemia 12/07/2016  . Influenza B 12/04/2016  . Type 2 diabetes mellitus with vascular disease ([place]) 05/31/2016  . Normocytic normochromic anemia 05/31/2016  . CAD (coronary artery disease) 01/06/2016  . DVT (deep venous thrombosis) ([place]) 01/06/2016  . Lactic acidosis 05/28/2014  . Nonspecific chest pain 01/29/2014  . Uncontrolled secondary diabetes with peripheral neuropathy ([place]) 01/29/2014  . Morbid obesity ([place]) 01/29/2014  . Snoring 01/29/2014  . Dyslipidemia 01/29/2014  . HTN (hypertension) 01/29/2014  . Abnormal nuclear stress test 01/29/2014    Past Surgical  History:  Procedure Laterality Date  . CARDIAC CATHETERIZATION N/A 05/31/2016   Procedure: Left Heart Cath and Coronary Angiography;  Surgeon: Peter M Martinique, MD;  Location: [facility] LAB;  Service: Cardiovascular;  Laterality: N/A;  . CARDIAC CATHETERIZATION N/A 05/31/2016   Procedure: Intravascular Pressure Wire/FFR Study;  Surgeon: Peter M Martinique, MD;  Location: [facility] CV LAB;  Service: Cardiovascular;  Laterality: N/A;  . CARDIAC CATHETERIZATION N/A 05/31/2016   Procedure: Coronary Stent Intervention;  Surgeon: Peter M Martinique, MD;  Location: [facility] LAB;  Service: Cardiovascular;  Laterality: N/A;  . LEFT HEART CATHETERIZATION WITH CORONARY ANGIOGRAM N/A 02/03/2014   Procedure: LEFT HEART CATHETERIZATION WITH CORONARY ANGIOGRAM;  Surgeon: Pixie Casino, MD;  Location: [facility] CATH LAB;  Service: Cardiovascular;  Laterality: N/A;  . left leg stent          Home Medications    Prior to Admission medications   Medication Sig Start Date End Date Taking? Authorizing Provider  aspirin 81 MG chewable tablet Chew 1 tablet (81 mg total) by mouth daily. 06/01/16  Yes Elwin Mocha, MD  atorvastatin (LIPITOR) 80 MG tablet Take 1 tablet (80 mg total) by mouth daily. 01/06/16  Yes Lelon Perla, MD  canagliflozin [facility]) 100 MG TABS tablet Take 1 tablet (100 mg total) by mouth daily before breakfast. 12/01/16  Yes Ledell Noss, MD  carvedilol (COREG) 25 MG tablet Take 25 mg by mouth 2 (two) times daily with a meal.  Yes Historical Provider, MD  clopidogrel (PLAVIX) 75 MG tablet Take 75 mg by mouth daily.   Yes Historical Provider, MD  famotidine (PEPCID) 20 MG tablet Take 20 mg by mouth 2 (two) times daily.   Yes Historical Provider, MD  furosemide (LASIX) 40 MG tablet Take 40 mg by mouth 2 (two) times daily.   Yes Historical Provider, MD  gabapentin (NEURONTIN) 300 MG capsule Take 600 mg by mouth 4 (four) times daily.    Yes Historical Provider, MD  glipiZIDE (GLUCOTROL) 10 MG tablet  Take 1 tablet by mouth 2 (two) times daily. 11/13/15  Yes Historical Provider, MD  hydrALAZINE (APRESOLINE) 50 MG tablet Take 50 mg by mouth 2 (two) times daily.   Yes Historical Provider, MD  isosorbide dinitrate (ISORDIL) 30 MG tablet Take 1 tablet (30 mg total) by mouth 2 (two) times daily. 11/22/16  Yes Erma Heritage, PA  menthol-cetylpyridinium (CEPACOL) 3 MG lozenge Take 1 lozenge (3 mg total) by mouth as needed for sore throat. 12/05/16  Yes Alphonzo Grieve, MD  metFORMIN (GLUCOPHAGE) 1000 MG tablet Take 1,000 mg by mouth 2 (two) times daily with a meal.   Yes Historical Provider, MD  methocarbamol (ROBAXIN) 750 MG tablet Take 750 mg by mouth 3 (three) times daily as needed for muscle spasms.  10/12/16  Yes Historical Provider, MD  nitroGLYCERIN (NITROSTAT) 0.4 MG SL tablet Place 0.4 mg under the tongue every 5 (five) minutes as needed for chest pain. 12/17/16  Yes Historical Provider, MD  potassium chloride SA (K-DUR,KLOR-CON) 20 MEQ tablet Take 20 mEq by mouth daily.   Yes Historical Provider, MD  sertraline (ZOLOFT) 50 MG tablet Take 50 mg by mouth daily.   Yes Historical Provider, MD  sitaGLIPtin (JANUVIA) 100 MG tablet Take 100 mg by mouth daily.   Yes Historical Provider, MD  traMADol (ULTRAM) 50 MG tablet Take 50 mg by mouth 2 (two) times daily as needed for pain. 11/05/16  Yes Historical Provider, MD  traZODone (DESYREL) 50 MG tablet Take 1 tablet (50 mg total) by mouth at bedtime. 12/15/16  Yes Alexa Angela Burke, MD  cholecalciferol (VITAMIN D) 1000 units tablet Take 1 tablet (1,000 Units total) by mouth daily. Patient not taking: Reported on 01/22/2017 12/15/16   Alexa Angela Burke, MD  pantoprazole (PROTONIX) 20 MG tablet Take 1 tablet (20 mg total) by mouth 2 (two) times daily. 01/22/17   Gareth Morgan, MD    Family History Family History  Problem Relation Age of Onset  . Cancer Father   . Hypertension Mother   . Diabetes Mother   . Breast cancer Mother   . Hypertension Brother   .  Diabetes Brother   . Hypertension Sister   . Diabetes Sister     Social History Social History  Substance Use Topics  . Smoking status: Never Smoker  . Smokeless tobacco: Never Used  . Alcohol use No     Allergies   Coconut oil and Ibuprofen   Review of Systems Review of Systems  Constitutional: Positive for appetite change and fatigue. Negative for chills and fever.  HENT: Positive for sinus pressure and tinnitus. Negative for ear pain and voice change.   Eyes: Positive for visual disturbance. Negative for photophobia.  Respiratory: Negative for cough and shortness of breath.   Cardiovascular: Negative for chest pain and leg swelling.  Gastrointestinal: Positive for abdominal pain, diarrhea and nausea. Negative for blood in stool, constipation and vomiting.  Genitourinary: Negative for dysuria, flank pain, frequency  and hematuria.  Musculoskeletal: Positive for back pain. Negative for myalgias, neck pain and neck stiffness.  Skin: Negative for rash and wound.  Neurological: Positive for dizziness, weakness (generalized), light-headedness and headaches. Negative for numbness.  All other systems reviewed and are negative.    Physical Exam Updated Vital Signs BP (!) 149/87   Pulse 71   Temp 99.1 F (37.3 C) (Oral)   Resp 19   SpO2 95%   Physical Exam  Constitutional: He is oriented to person, place, and time. He appears well-developed and well-nourished. No distress.  Patient appears drowsy but easily aroused.  HENT:  Head: Normocephalic and atraumatic.  Mouth/Throat: Oropharynx is clear and moist.  Tenderness to percussion especially in the right frontal sinus. Normal TMs bilaterally.  Eyes: EOM are normal. Pupils are equal, round, and reactive to light.  Pupils are 2+ and reactive. No nystagmus noted.  Neck: Normal range of motion. Neck supple.  No meningismus. No posterior midline cervical tenderness to palpation.  Cardiovascular: Normal rate and regular rhythm.   Exam reveals no gallop and no friction rub.   No murmur heard. Pulmonary/Chest: Effort normal and breath sounds normal. No respiratory distress. He has no wheezes. He has no rales. He exhibits no tenderness.  Abdominal: Soft. Bowel sounds are normal. There is tenderness. There is no rebound and no guarding.  Diffuse abdominal tenderness especially in the epigastric region. No definite rebound or guarding.  Musculoskeletal: Normal range of motion. He exhibits no edema or tenderness.  No lower extremity swelling or asymmetry.  Neurological: He is oriented to person, place, and time.  5/5 motor in bilateral upper and left lower extremities. Patient's having pain with range of motion of his right lower extremity limiting exam. Sensation is fully intact.  Skin: Skin is warm and dry. Capillary refill takes less than 2 seconds. No rash noted. No erythema.  Psychiatric: He has a normal mood and affect. His behavior is normal.  Nursing note and vitals reviewed.    ED Treatments / Results  Labs (all labs ordered are listed, but only abnormal results are displayed) Labs Reviewed  CBC - Abnormal; Notable for the following:       Result Value   WBC 13.5 (*)    RBC 4.08 (*)    Hemoglobin 12.0 (*)    HCT 36.9 (*)    All other components within normal limits  DIFFERENTIAL - Abnormal; Notable for the following:    Neutro Abs 9.4 (*)    All other components within normal limits  COMPREHENSIVE METABOLIC PANEL - Abnormal; Notable for the following:    Potassium 3.4 (*)    Glucose, Bld 197 (*)    ALT 15 (*)    Alkaline Phosphatase 37 (*)    All other components within normal limits  RAPID URINE DRUG SCREEN, HOSP PERFORMED - Abnormal; Notable for the following:    Opiates POSITIVE (*)    All other components within normal limits  URINALYSIS, ROUTINE W REFLEX MICROSCOPIC - Abnormal; Notable for the following:    Color, Urine STRAW (*)    Glucose, UA >=500 (*)    All other components within normal  limits  CBG MONITORING, ED - Abnormal; Notable for the following:    Glucose-Capillary 200 (*)    All other components within normal limits  I-STAT CHEM 8, ED - Abnormal; Notable for the following:    Potassium 3.3 (*)    Creatinine, Ser 0.60 (*)    Glucose, Bld 205 (*)  Calcium, Ion 1.10 (*)    Hemoglobin 12.9 (*)    HCT 38.0 (*)    All other components within normal limits  PROTIME-INR  APTT  LIPASE, BLOOD  ETHANOL  I-STAT TROPOININ, ED  CBG MONITORING, ED    EKG  EKG Interpretation  Date/Time:  Sunday January 22 2017 11:59:04 EDT Ventricular Rate:  75 PR Interval:  156 QRS Duration: 82 QT Interval:  396 QTC Calculation: 442 R Axis:   27 Text Interpretation:  Normal sinus rhythm Nonspecific T wave abnormality Abnormal ECG Confirmed by Lita Mains  MD, Zacari Stiff ([id number]) on 01/22/2017 12:45:00 PM       Radiology Ct Head Wo Contrast  Result Date: 01/22/2017 CLINICAL DATA:  Headache and dizziness. History of diabetes and hypertension. EXAM: CT HEAD WITHOUT CONTRAST TECHNIQUE: Contiguous axial images were obtained from the base of the skull through the vertex without intravenous contrast. COMPARISON:  07/22/2016 FINDINGS: Brain: No evidence of acute infarction, hemorrhage, hydrocephalus, extra-axial collection or mass lesion/mass effect. Vascular: No hyperdense vessel or unexpected calcification. Skull: Normal. Negative for fracture or focal lesion. Sinuses/Orbits: Globes and orbits are unremarkable. The sinuses and mastoid air cells are clear. Other: None. IMPRESSION: 1. Normal unenhanced CT scan of the brain. Electronically Signed   By: Lajean Manes M.D.   On: 01/22/2017 13:05   Ct Abdomen Pelvis W Contrast  Result Date: 01/22/2017 CLINICAL DATA:  Initial evaluation for acute severe abdominal pain. EXAM: CT ABDOMEN AND PELVIS WITH CONTRAST TECHNIQUE: Multidetector CT imaging of the abdomen and pelvis was performed using the standard protocol following bolus administration of  intravenous contrast. CONTRAST:  171mL ISOVUE-300 IOPAMIDOL (ISOVUE-300) INJECTION 61% COMPARISON:  Prior CT from 10/21/2016. FINDINGS: Lower chest: Mild bibasilar atelectatic changes present within the visualized lung bases. Visualized lung bases are otherwise clear. Cardiomegaly partially visualized. Hepatobiliary: Diffuse hypoattenuation liver consistent with steatosis. Liver otherwise unremarkable. Gallbladder within normal limits. No biliary dilatation. Pancreas: Pancreas within normal limits. Spleen: Scattered hypodensities within the spleen noted, indeterminate, grossly stable from previous. These are of doubtful significance. Wedging of the splenic contour also stable. Spleen otherwise unremarkable. Adrenals/Urinary Tract: Adrenal glands are normal. Kidneys equal size with symmetric enhancement. The no nephrolithiasis, hydronephrosis, or focal enhancing renal mass. No hydroureter. Bladder within normal limits. Stomach/Bowel: Stomach within normal limits. Mild hazy stranding about the fourth portion of the duodenum, which may reflect sequelae of acute do adenitis (series 3, image 38). No evidence for bowel obstruction. No other acute inflammatory changes seen about the bowels. Appendix within normal limits. Vascular/Lymphatic: Normal intravascular enhancement seen throughout the intra-abdominal aorta and its branch vessels. No pathologically enlarged intra-abdominal or pelvic lymph nodes identified. Mildly prominent 15 mm left inguinal node measures at the upper limits of normal. Reproductive: Prostate normal. Other: No free air or fluid. Musculoskeletal: No acute osseous abnormality. No worrisome lytic or blastic osseous lesions. Sclerotic focus within the left iliac wing most compatible with a benign bone island. Bilateral facet arthrosis noted within the lower lumbar spine. Degenerative osteoarthritic changes present about the hips. IMPRESSION: 1. Hazy inflammatory stranding about the duodenum, consistent  with acute duodenitis. 2. No other acute intra-abdominal or pelvic process identified. 3. Hepatic steatosis. Electronically Signed   By: Jeannine Boga M.D.   On: 01/22/2017 17:39    Procedures Procedures (including critical care time)  Medications Ordered in ED Medications  sodium chloride 0.9 % bolus 1,000 mL (0 mLs Intravenous Stopped 01/22/17 1749)  iopamidol (ISOVUE-300) 61 % injection (100 mLs  Contrast Given 01/22/17 1658)  Initial Impression / Assessment and Plan / ED Course  I have reviewed the triage vital signs and the nursing notes.  Pertinent labs & imaging results that were available during my care of the patient were reviewed by me and considered in my medical decision making (see chart for details).     CT head without acute abnormality. Patient does have elevation in white blood cell count. Continues to have diffuse abdominal tenderness. Will get CT abdomen and reevaluate. Signed out to oncoming emergency physician pending CT abdomen. Final Clinical Impressions(s) / ED Diagnoses   Final diagnoses:  Nonintractable headache, unspecified chronicity pattern, unspecified headache type  Enteritis  Lightheadedness    New Prescriptions Discharge Medication List as of 01/22/2017  7:15 PM       Julianne Rice, MD 01/23/17 1007

## 2017-01-24 ENCOUNTER — Telehealth: Payer: Self-pay

## 2017-01-24 NOTE — Telephone Encounter (Signed)
Lm for rtc 

## 2017-01-24 NOTE — Telephone Encounter (Signed)
Pt is calling back to speak with a nurse. Please call back.  

## 2017-01-24 NOTE — Telephone Encounter (Signed)
Requesting lab result. Please call back. 

## 2017-01-25 NOTE — Telephone Encounter (Signed)
Called pt, he states he thinks he might need some lab work, reviewed and he had a lot of bloodwork done 4/1 in ED visit, he wanted to see dr rice asap, that turns out to be 6/1, in the meantime he would like to know if there is anything you would like to f/u on?

## 2017-01-25 NOTE — Progress Notes (Signed)
Cardiology Office Note    Date:  01/26/2017   ID:  [name], DOB Mar 17, 1961, MRN [id number]  PCP:  Hinton Lovely, MD  Cardiologist: Dr. Stanford Breed  Chief Complaint  Patient presents with  . Follow-up    medical clearance     History of Present Illness:    [name] is a 56 y.o. male with past medical history of CAD (s/p DES to LAD in 05/2016), HTN, HLD, Type 2 DM, spinal stenosis, and OSA (on CPAP) who presents to the office today for medical clearance in regards to an upcoming GI procedure.   Was last seen by myself on 11/22/2016 after having been seen in the ER for evaluation of chest pain. His symptoms seemed atypical in etiology as they were worse with coughing and deep inspiration. EKG was without acute ischemic changes and he reported going to cardiac rehab and performing exercises without any anginal symptoms.   In the interim, he was hospitalized in 11/2016 for Influenza B. Completed a 5-day course of Tamiflu and no cardiac complications were noted during his admission.   He has since been seen in the ER 3 times within the past month, twice for back pain and once for dizziness.   By review of Care Everywhere, he has been followed by [facility] for dysphagia, vomiting, and rectal bleeding (history of hemorrhoids) and they are planning to perform and EGD and Flex Sigmoidoscopy to evaluate his symptoms.   In talking with the patient today, he denies any recent episodes of chest discomfort or dyspnea on exertion. He was participating in cardiac rehabilitation but is unable to do so now secondary to his significant back pain and right knee pain. He reports having episodes of chest discomfort when he was diagnosed with the flu in 11/2016 but says this was worse with coughing. Denies any repeat episodes of chest discomfort since.  Reports good compliance with his ASA and Plavix, having not missed any recent doses.   Past Medical History:  Diagnosis Date  . Arthritis     . Back pain   . CAD (coronary artery disease)    a. s/p DES to LAD in 05/2016  . Hyperlipidemia   . Hypertension   . IBS (irritable bowel syndrome)   . OSA (obstructive sleep apnea)   . Pancreatitis   . PE (pulmonary thromboembolism) ([place])   . PUD (peptic ulcer disease)   . Type 2 diabetes mellitus ([place])     Past Surgical History:  Procedure Laterality Date  . CARDIAC CATHETERIZATION N/A 05/31/2016   Procedure: Left Heart Cath and Coronary Angiography;  Surgeon: Peter M Martinique, MD;  Location: [place] [facility];  Service: Cardiovascular;  Laterality: N/A;  . CARDIAC CATHETERIZATION N/A 05/31/2016   Procedure: Intravascular Pressure Wire/FFR Study;  Surgeon: Peter M Martinique, MD;  Location: [place] [facility];  Service: Cardiovascular;  Laterality: N/A;  . CARDIAC CATHETERIZATION N/A 05/31/2016   Procedure: Coronary Stent Intervention;  Surgeon: Peter M Martinique, MD;  Location: [place] [facility];  Service: Cardiovascular;  Laterality: N/A;  . LEFT HEART CATHETERIZATION WITH CORONARY ANGIOGRAM N/A 02/03/2014   Procedure: LEFT HEART CATHETERIZATION WITH CORONARY ANGIOGRAM;  Surgeon: Pixie Casino, MD;  Location: [facility] C[facility];  Service: Cardiovascular;  Laterality: N/A;  . left leg stent       Current Medications: Outpatient Medications Prior to Visit  Medication Sig Dispense Refill  . aspirin 81 MG chewable tablet Chew 1 tablet (81 mg total) by  mouth daily. 30 tablet 0  . canagliflozin (INVOKANA) 100 MG TABS tablet Take 1 tablet (100 mg total) by mouth daily before breakfast. 30 tablet 3  . clopidogrel (PLAVIX) 75 MG tablet Take 75 mg by mouth daily.    . famotidine (PEPCID) 20 MG tablet Take 20 mg by mouth 2 (two) times daily.    Marland Kitchen gabapentin (NEURONTIN) 300 MG capsule Take 600 mg by mouth 4 (four) times daily.     Marland Kitchen glipiZIDE (GLUCOTROL) 10 MG tablet Take 1 tablet by mouth 2 (two) times daily.  5  . hydrALAZINE (APRESOLINE) 50 MG tablet Take 50 mg by mouth 2 (two) times daily.    .  isosorbide dinitrate (ISORDIL) 30 MG tablet Take 1 tablet (30 mg total) by mouth 2 (two) times daily. 60 tablet 4  . metFORMIN (GLUCOPHAGE) 1000 MG tablet Take 1,000 mg by mouth 2 (two) times daily with a meal.    . methocarbamol (ROBAXIN) 750 MG tablet Take 750 mg by mouth 3 (three) times daily as needed for muscle spasms.   2  . nitroGLYCERIN (NITROSTAT) 0.4 MG SL tablet Place 0.4 mg under the tongue every 5 (five) minutes as needed for chest pain.  4  . pantoprazole (PROTONIX) 20 MG tablet Take 1 tablet (20 mg total) by mouth 2 (two) times daily. 30 tablet 0  . potassium chloride SA (K-DUR,KLOR-CON) 20 MEQ tablet Take 20 mEq by mouth daily.    . sertraline (ZOLOFT) 50 MG tablet Take 50 mg by mouth daily.    . sitaGLIPtin (JANUVIA) 100 MG tablet Take 100 mg by mouth daily.    . traZODone (DESYREL) 50 MG tablet Take 1 tablet (50 mg total) by mouth at bedtime. 30 tablet 11  . atorvastatin (LIPITOR) 80 MG tablet Take 1 tablet (80 mg total) by mouth daily. 90 tablet 3  . carvedilol (COREG) 25 MG tablet Take 25 mg by mouth 2 (two) times daily with a meal.    . furosemide (LASIX) 40 MG tablet Take 40 mg by mouth 2 (two) times daily.    . cholecalciferol (VITAMIN D) 1000 units tablet Take 1 tablet (1,000 Units total) by mouth daily. (Patient not taking: Reported on 01/22/2017) 30 tablet 3  . menthol-cetylpyridinium (CEPACOL) 3 MG lozenge Take 1 lozenge (3 mg total) by mouth as needed for sore throat. (Patient not taking: Reported on 01/26/2017) 100 tablet 12  . traMADol (ULTRAM) 50 MG tablet Take 50 mg by mouth 2 (two) times daily as needed for pain.  0   No facility-administered medications prior to visit.      Allergies:   Coconut flavor [flavoring agent]; Coconut oil; and Ibuprofen   Social History   Social History  . Marital status: Single    Spouse name: N/A  . Number of children: 3  . Years of education: 12   Occupational History  . Scientist, product/process development   Social History Main  Topics  . Smoking status: Never Smoker  . Smokeless tobacco: Never Used  . Alcohol use No  . Drug use: No  . Sexual activity: Not Asked   Other Topics Concern  . None   Social History Narrative   Lives in an ALF in [place]. Independent and ambulatory with cane.      Family History:  The patient's family history includes Breast cancer in his mother; Cancer in his father; Diabetes in his brother, mother, and sister; Hypertension in his brother, mother, and sister.   Review of Systems:  Please see the history of present illness.     General:  No chills, fever, night sweats or weight changes.  Cardiovascular:  No chest pain, dyspnea on exertion, edema, orthopnea, palpitations, paroxysmal nocturnal dyspnea. Dermatological: No rash, lesions/masses Respiratory: No cough, dyspnea MSK: Positive for back pain and bilateral knee pain.  Urologic: No hematuria, dysuria Abdominal:   No nausea, vomiting, diarrhea, bright red blood per rectum, melena, or hematemesis Neurologic:  No visual changes, wkns, changes in mental status. All other systems reviewed and are otherwise negative except as noted above.   Physical Exam:    VS:  BP 126/70   Pulse 88   Ht 5\' 10"  (1.778 m)   Wt (!) 305 lb (138.3 kg)   BMI 43.76 kg/m    General: Well developed, obese African American male appearing in no acute distress. Head: Normocephalic, atraumatic, sclera non-icteric, no xanthomas, nares are without discharge.  Neck: No carotid bruits. JVD not elevated.  Lungs: Respirations regular and unlabored, without wheezes or rales.  Heart: Regular rate and rhythm. No S3 or S4.  No murmur, no rubs, or gallops appreciated. Abdomen: Soft, non-tender, non-distended with normoactive bowel sounds. No hepatomegaly. No rebound/guarding. No obvious abdominal masses. Msk:  Strength and tone appear normal for age. No joint deformities or effusions. Extremities: No clubbing or cyanosis. No lower extremity edema.  Distal  pedal pulses are 2+ bilaterally. Neuro: Alert and oriented X 3. Moves all extremities spontaneously. No focal deficits noted. Psych:  Responds to questions appropriately with a normal affect. Skin: No rashes or lesions noted  Wt Readings from Last 3 Encounters:  01/26/17 (!) 305 lb (138.3 kg)  12/29/16 (!) 310 lb 9.6 oz (140.9 kg)  12/15/16 (!) 310 lb 3.2 oz (140.7 kg)     Studies/Labs Reviewed:   EKG:  EKG is not ordered today. EKG from 01/22/2017 reviewed which showed NSR, HR 75, with no acute ST or T-wave changes when compared to prior tracings.   Recent Labs: 12/03/2016: B Natriuretic Peptide 27.3 01/22/2017: ALT 15; BUN 18; Creatinine, Ser 0.60; Hemoglobin 12.9; Platelets 312; Potassium 3.3; Sodium 140   Lipid Panel    Component Value Date/Time   CHOL 82 05/31/2016 0114   TRIG 244 (H) 05/31/2016 0114   HDL 26 (L) 05/31/2016 0114   CHOLHDL 3.2 05/31/2016 0114   VLDL 49 (H) 05/31/2016 0114   LDLCALC 7 05/31/2016 0114    Additional studies/ records that were reviewed today include:   Cardiac Catheterization: 05/31/2016  Mid RCA lesion, 25 %stenosed.  A STENT SYNERGY DES 4X16 drug eluting stent was successfully placed.  Mid LAD lesion, 70 %stenosed.  Post intervention, there is a 0% residual stenosis.   1. Single vessel obstructive CAD. 70% mid LAD with abnormal FFR of 0.67.  2. Successful stenting of the mid LAD with DES 3. Elevated LVEDP  Plan: DAPT for one year. Anticipate DC in am.   Assessment:    1. Encounter for pre-operative cardiovascular clearance   2. Coronary artery disease involving native coronary artery of native heart without angina pectoris   3. Essential hypertension   4. Pure hypercholesterolemia   5. OSA on CPAP   6. Spinal stenosis, unspecified spinal region      Plan:   In order of problems listed above:  1. Preoperative Cardiac Clearance for Endoscopy and Flexible Sigmoidoscopy - the patient has known CAD with DES placement to the LAD  in 05/2016. He experienced atypical chest pain in 11/2016 while battling the flu  with his pain being worse with coughing and deep inspiration. Denies any exertional chest pain or dyspnea on exertion. EKG from 01/22/2017 reviewed which is without acute ischemic changes. - has been experiencing dysphagia and rectal bleeding intermittently for several years. Says he was diagnosed with a stomach polyp last year and this needs further investigation as well.   - from a cardiac perspective, no further ischemic testing is indicated prior to the scheduled procedure. The procedure itself is low-risk from a cardiac perspective and he is of acceptable risk. Reviewed holding Plavix for the procedure with Dr. Stanford Breed and with him being 8 months out from stent placement, can hold Plavix 7 days prior to the procedure. Will need to continue with ASA 81mg  daily during this time frame. Resume both ASA and Plavix after the procedure, as he should be on DAPT for 12 months from stent placement.   2. CAD - s/p DES to LAD in 05/2016.  - recent EKG is without acute ischemic changes and he denies any recent exertional symptoms.  - continue ASA, Plavix (hold as above), Isordil, statin, and BB therapy.   3. HTN - BP well-controlled at 126/70 during today's visit.  - continue current medication regimen.   4. HLD - Lipid Panel in 05/2016 showed total cholesterol 82, HDL 26, and LDL 7. - continue statin therapy.   5. OSA - on CPAP.  6. Spinal Stenosis - being followed by Orthopedics as he has experienced worsening symptoms over the past 6+ months. Walking continuously with a cane. Had to stop cardiac rehab due to his significant back and knee pain.  - transportation request for [facility] completed today.    Medication Adjustments/Labs and Tests Ordered: Current medicines are reviewed at length with the patient today.  Concerns regarding medicines are outlined above.  Medication changes, Labs and Tests ordered today are listed  in the Patient Instructions below. Patient Instructions  Medication Instructions:  Continue current medications  Labwork: None Odered  Testing/Procedures: None Ordered  Follow-Up: Your physician recommends that you schedule a follow-up appointment in: 3 Months with Dr Stanford Breed.  Any Other Special Instructions Will Be Listed Below (If Applicable).  Hold Plavix 7 days prior to your procedure  If you need a refill on your cardiac medications before your next appointment, please call your pharmacy.  Signed, Erma Heritage, PA  01/26/2017 1:27 PM    [facility] [address], [address], [place]  [address] Phone: [phone]; Fax: [phone]  [address] [place],  [address] Phone: [phone]

## 2017-01-26 ENCOUNTER — Ambulatory Visit (INDEPENDENT_AMBULATORY_CARE_PROVIDER_SITE_OTHER): Payer: Medicaid Other | Admitting: Student

## 2017-01-26 ENCOUNTER — Encounter: Payer: Self-pay | Admitting: Student

## 2017-01-26 VITALS — BP 126/70 | HR 88 | Ht 70.0 in | Wt 305.0 lb

## 2017-01-26 DIAGNOSIS — Z01810 Encounter for preprocedural cardiovascular examination: Secondary | ICD-10-CM | POA: Diagnosis not present

## 2017-01-26 DIAGNOSIS — I10 Essential (primary) hypertension: Secondary | ICD-10-CM

## 2017-01-26 DIAGNOSIS — I2510 Atherosclerotic heart disease of native coronary artery without angina pectoris: Secondary | ICD-10-CM

## 2017-01-26 DIAGNOSIS — Z9989 Dependence on other enabling machines and devices: Secondary | ICD-10-CM

## 2017-01-26 DIAGNOSIS — E7800 Pure hypercholesterolemia, unspecified: Secondary | ICD-10-CM

## 2017-01-26 DIAGNOSIS — M4800 Spinal stenosis, site unspecified: Secondary | ICD-10-CM

## 2017-01-26 DIAGNOSIS — G4733 Obstructive sleep apnea (adult) (pediatric): Secondary | ICD-10-CM

## 2017-01-26 MED ORDER — CARVEDILOL 25 MG PO TABS: 25.0000 mg | ORAL_TABLET | Freq: Two times a day (BID) | ORAL | 3 refills | Status: DC

## 2017-01-26 MED ORDER — FUROSEMIDE 40 MG PO TABS: 40.0000 mg | ORAL_TABLET | Freq: Two times a day (BID) | ORAL | 3 refills | Status: DC

## 2017-01-26 MED ORDER — ATORVASTATIN CALCIUM 80 MG PO TABS: 80.0000 mg | ORAL_TABLET | Freq: Every day | ORAL | 3 refills | Status: DC

## 2017-01-26 NOTE — Patient Instructions (Signed)
Medication Instructions:  Continue current medications  Labwork: None Odered  Testing/Procedures: None Ordered  Follow-Up: Your physician recommends that you schedule a follow-up appointment in: 3 Months with Dr Stanford Breed.   Any Other Special Instructions Will Be Listed Below (If Applicable).   Hold Plavix 7 days prior to your procedure  If you need a refill on your cardiac medications before your next appointment, please call your pharmacy.

## 2017-02-08 ENCOUNTER — Ambulatory Visit (HOSPITAL_COMMUNITY)
Admission: RE | Admit: 2017-02-08 | Discharge: 2017-02-08 | Disposition: A | Discharge status: Home / Self Care | Payer: Medicaid Other | Source: Ambulatory Visit | Attending: Internal Medicine | Admitting: Internal Medicine

## 2017-02-08 ENCOUNTER — Encounter: Payer: Self-pay | Admitting: Internal Medicine

## 2017-02-08 ENCOUNTER — Ambulatory Visit (INDEPENDENT_AMBULATORY_CARE_PROVIDER_SITE_OTHER): Payer: Medicaid Other | Admitting: Internal Medicine

## 2017-02-08 VITALS — BP 147/76 | HR 88 | Temp 98.2°F | Ht 70.0 in | Wt 311.7 lb

## 2017-02-08 DIAGNOSIS — Z79891 Long term (current) use of opiate analgesic: Secondary | ICD-10-CM | POA: Diagnosis not present

## 2017-02-08 DIAGNOSIS — R1031 Right lower quadrant pain: Secondary | ICD-10-CM

## 2017-02-08 DIAGNOSIS — G8929 Other chronic pain: Secondary | ICD-10-CM

## 2017-02-08 DIAGNOSIS — M25551 Pain in right hip: Secondary | ICD-10-CM

## 2017-02-08 MED ORDER — KETOROLAC TROMETHAMINE 60 MG/2ML IM SOLN
60.0000 mg | Freq: Once | INTRAMUSCULAR | Status: AC
  Administered 2017-02-08: 60 mg via INTRAMUSCULAR

## 2017-02-08 NOTE — Patient Instructions (Signed)
[name],  Please schedule follow up with your orthopedic surgeon.

## 2017-02-08 NOTE — Progress Notes (Signed)
   CC: Groin pain  HPI:  Mr.Jomel L Antigua is a 56 y.o. male with a past medical history listed below here today with complaints of right groin pain.  For details of today's visit and the status of his chronic medical issues please refer to the assessment and plan.   Past Medical History:  Diagnosis Date  . Arthritis   . Back pain   . CAD (coronary artery disease)    a. s/p DES to LAD in 05/2016  . Hyperlipidemia   . Hypertension   . IBS (irritable bowel syndrome)   . OSA (obstructive sleep apnea)   . Pancreatitis   . PE (pulmonary thromboembolism) ([place])   . PUD (peptic ulcer disease)   . Type 2 diabetes mellitus ([facility])     Review of Systems:   See HPI  Physical Exam:  Vitals:   02/08/17 1527  BP: (!) 147/76  Pulse: 88  Temp: 98.2 F (36.8 C)  TempSrc: Oral  SpO2: 97%  Weight: (!) 311 lb 11.2 oz (141.4 kg)  Height: 5\' 10"  (1.778 m)   Physical Exam  Constitutional: He is well-developed, well-nourished, and in no distress. No distress.  Obese male  Cardiovascular: Normal rate and regular rhythm.   Pulmonary/Chest: Effort normal and breath sounds normal.  Musculoskeletal:       Right hip: Normal. He exhibits normal range of motion and no tenderness.       Right knee: He exhibits normal range of motion, no swelling and no bony tenderness. No tenderness found.  No tenderness to palpation. No erythema or warmth.   Neurological:  Strength in proximal right leg limited to pain. Otherwise 5/5 strength throughout. Sensation intact throughout.   Vitals reviewed.    Assessment & Plan:   See Encounters Tab for problem based charting.  Patient discussed with Dr. Lynnae January

## 2017-02-10 NOTE — Progress Notes (Signed)
Internal Medicine Clinic Attending  Case discussed with Dr. Boswell at the time of the visit.  We reviewed the resident's history and exam and pertinent patient test results.  I agree with the assessment, diagnosis, and plan of care documented in the resident's note.  

## 2017-02-10 NOTE — Assessment & Plan Note (Signed)
[name] presents today with complaints of acute onset right groin pain. He reports walking up a flight of stairs 3 days prior and experienced acute onset pain in his right groin. He describes the pain as if 'someone him me in the leg with a sledge hammer.' He reports this pain radiates down to his right knee. Denies any decreased sensation, bowel or bladder incontinence or weakness. He is followed by sports medicine in [facility] [place]. Reports that he has been seen in the past and gotten injections of Toradol which has helped with his pain. Currently on chronic opioid therapy for chronic pain.   Plan: toradol 60 mg IM x1 Recommended follow up with sports medicine XR right hip with no acute changes

## 2017-02-20 ENCOUNTER — Emergency Department (HOSPITAL_COMMUNITY)
Admission: EM | Admit: 2017-02-20 | Discharge: 2017-02-20 | Disposition: A | Discharge status: Home / Self Care | Payer: Medicaid Other | Attending: Emergency Medicine | Admitting: Emergency Medicine

## 2017-02-20 ENCOUNTER — Encounter (HOSPITAL_COMMUNITY): Payer: Self-pay

## 2017-02-20 DIAGNOSIS — G8929 Other chronic pain: Secondary | ICD-10-CM | POA: Diagnosis not present

## 2017-02-20 DIAGNOSIS — I2510 Atherosclerotic heart disease of native coronary artery without angina pectoris: Secondary | ICD-10-CM | POA: Insufficient documentation

## 2017-02-20 DIAGNOSIS — Z7984 Long term (current) use of oral hypoglycemic drugs: Secondary | ICD-10-CM | POA: Insufficient documentation

## 2017-02-20 DIAGNOSIS — M549 Dorsalgia, unspecified: Secondary | ICD-10-CM | POA: Diagnosis present

## 2017-02-20 DIAGNOSIS — Z7982 Long term (current) use of aspirin: Secondary | ICD-10-CM | POA: Diagnosis not present

## 2017-02-20 DIAGNOSIS — M5441 Lumbago with sciatica, right side: Secondary | ICD-10-CM | POA: Diagnosis not present

## 2017-02-20 DIAGNOSIS — I10 Essential (primary) hypertension: Secondary | ICD-10-CM | POA: Insufficient documentation

## 2017-02-20 DIAGNOSIS — E119 Type 2 diabetes mellitus without complications: Secondary | ICD-10-CM | POA: Diagnosis not present

## 2017-02-20 MED ORDER — KETOROLAC TROMETHAMINE 60 MG/2ML IM SOLN
60.0000 mg | Freq: Once | INTRAMUSCULAR | Status: AC
Start: 2017-02-20 — End: 2017-02-20
  Administered 2017-02-20: 60 mg via INTRAMUSCULAR
  Filled 2017-02-20: qty 2

## 2017-02-20 NOTE — Discharge Instructions (Signed)
Patient with back pain.  No neurological deficits and normal neuro exam.  Patient can walk but states is painful.  No loss of bowel or bladder control.  No concern for cauda equina.  No fever, night sweats, weight loss, h/o cancer, IVDU.  RICE protocol and pain medicine indicated and discussed with patient.

## 2017-02-20 NOTE — ED Triage Notes (Signed)
Per Pt, Pt is coming from home with complaints of sciatica pain that has flared up and created pain in his right knee and leg. Denies any change in bowel or bladder. Reports some numbness and tingling.

## 2017-02-20 NOTE — ED Provider Notes (Signed)
[place] DEPT Provider Note    By signing my name below, I, [name], attest that this documentation has been prepared under the direction and in the presence of Juan Mail, PA-C. Electronically Signed: Bea Stein, ED Scribe. 02/20/17. 9:53 AM.    History   Chief Complaint Chief Complaint  Patient presents with  . Back Pain   The history is provided by the patient and medical records. No language interpreter was used.    [name] is an obese 56 y.o. male with PMHx of chronic back pain, arthritis, HLD, HTN, CAD, T2DM who presents to the Emergency Department complaining of intermittent sharp, aching right sided back pain that began about six hours ago. He reports associated pain radiating down his right thigh. He reports some numbness into the right 4th and 5th toes. He states each episode of pain lasts about 3-4 minutes. He has not taken anything for pain. He denies modifying factors. He denies fever, chills, nausea, vomiting, bowel or bladder incontinence. He has an appt with his orthopedist next week. He is ambulatory with a cane.   Past Medical History:  Diagnosis Date  . Arthritis   . Back pain   . CAD (coronary artery disease)    a. s/p DES to LAD in 05/2016  . Hyperlipidemia   . Hypertension   . IBS (irritable bowel syndrome)   . OSA (obstructive sleep apnea)   . Pancreatitis   . PE (pulmonary thromboembolism) ([place])   . PUD (peptic ulcer disease)   . Type 2 diabetes mellitus [facility])     Patient Active Problem List   Diagnosis Date Noted  . Chronic back pain 12/29/2016  . Depression 12/15/2016  . Vitamin D deficiency 12/09/2016  . Right hip pain 12/07/2016  . Hypokalemia 12/07/2016  . Type 2 diabetes mellitus with vascular disease ([place]) 05/31/2016  . Normocytic normochromic anemia 05/31/2016  . CAD (coronary artery disease) 01/06/2016  . DVT (deep venous thrombosis) ([place]) 01/06/2016  . Lactic acidosis 05/28/2014  . Nonspecific chest  pain 01/29/2014  . Uncontrolled secondary diabetes with peripheral neuropathy ([place]) 01/29/2014  . Morbid obesity ([place]) 01/29/2014  . Snoring 01/29/2014  . Dyslipidemia 01/29/2014  . HTN (hypertension) 01/29/2014  . Abnormal nuclear stress test 01/29/2014    Past Surgical History:  Procedure Laterality Date  . CARDIAC CATHETERIZATION N/A 05/31/2016   Procedure: Left Heart Cath and Coronary Angiography;  Surgeon: Peter M Martinique, MD;  Location: [place] CV LAB;  Service: Cardiovascular;  Laterality: N/A;  . CARDIAC CATHETERIZATION N/A 05/31/2016   Procedure: Intravascular Pressure Wire/FFR Study;  Surgeon: Peter M Martinique, MD;  Location: [place] CV LAB;  Service: Cardiovascular;  Laterality: N/A;  . CARDIAC CATHETERIZATION N/A 05/31/2016   Procedure: Coronary Stent Intervention;  Surgeon: Peter M Martinique, MD;  Location: [place] CV LAB;  Service: Cardiovascular;  Laterality: N/A;  . LEFT HEART CATHETERIZATION WITH CORONARY ANGIOGRAM N/A 02/03/2014   Procedure: LEFT HEART CATHETERIZATION WITH CORONARY ANGIOGRAM;  Surgeon: Pixie Casino, MD;  Location[facility] LAB;  Service: Cardiovascular;  Laterality: N/A;  . left leg stent          Home Medications    Prior to Admission medications   Medication Sig Start Date End Date Taking? Authorizing Provider  aspirin 81 MG chewable tablet Chew 1 tablet (81 mg total) by mouth daily. 06/01/16   Elwin Mocha, MD  atorvastatin (LIPITOR) 80 MG tablet Take 1 tablet (80 mg total) by mouth daily. 01/26/17  Hidalgo, PA-C  canagliflozin (INVOKANA) 100 MG TABS tablet Take 1 tablet (100 mg total) by mouth daily before breakfast. 12/01/16   Ledell Noss, MD  carvedilol (COREG) 25 MG tablet Take 1 tablet (25 mg total) by mouth 2 (two) times daily with a meal. 01/26/17   Erma Heritage, PA-C  clopidogrel (PLAVIX) 75 MG tablet Take 75 mg by mouth daily.    Historical Provider, MD  famotidine (PEPCID) 20 MG tablet Take 20 mg by mouth 2 (two) times  daily.    Historical Provider, MD  furosemide (LASIX) 40 MG tablet Take 1 tablet (40 mg total) by mouth 2 (two) times daily. 01/26/17   Erma Heritage, PA-C  gabapentin (NEURONTIN) 300 MG capsule Take 600 mg by mouth 4 (four) times daily.     Historical Provider, MD  glipiZIDE (GLUCOTROL) 10 MG tablet Take 1 tablet by mouth 2 (two) times daily. 11/13/15   Historical Provider, MD  hydrALAZINE (APRESOLINE) 50 MG tablet Take 50 mg by mouth 2 (two) times daily.    Historical Provider, MD  isosorbide dinitrate (ISORDIL) 30 MG tablet Take 1 tablet (30 mg total) by mouth 2 (two) times daily. 11/22/16   Erma Heritage, PA-C  metFORMIN (GLUCOPHAGE) 1000 MG tablet Take 1,000 mg by mouth 2 (two) times daily with a meal.    Historical Provider, MD  methocarbamol (ROBAXIN) 750 MG tablet Take 750 mg by mouth 3 (three) times daily as needed for muscle spasms.  10/12/16   Historical Provider, MD  nitroGLYCERIN (NITROSTAT) 0.4 MG SL tablet Place 0.4 mg under the tongue every 5 (five) minutes as needed for chest pain. 12/17/16   Historical Provider, MD  pantoprazole (PROTONIX) 20 MG tablet Take 1 tablet (20 mg total) by mouth 2 (two) times daily. 01/22/17   Gareth Morgan, MD  potassium chloride SA (K-DUR,KLOR-CON) 20 MEQ tablet Take 20 mEq by mouth daily.    Historical Provider, MD  sertraline (ZOLOFT) 50 MG tablet Take 50 mg by mouth daily.    Historical Provider, MD  sitaGLIPtin (JANUVIA) 100 MG tablet Take 100 mg by mouth daily.    Historical Provider, MD  traZODone (DESYREL) 50 MG tablet Take 1 tablet (50 mg total) by mouth at bedtime. 12/15/16   Florinda Marker, MD    Family History Family History  Problem Relation Age of Onset  . Cancer Father   . Hypertension Mother   . Diabetes Mother   . Breast cancer Mother   . Hypertension Brother   . Diabetes Brother   . Hypertension Sister   . Diabetes Sister     Social History Social History  Substance Use Topics  . Smoking status: Never Smoker  .  Smokeless tobacco: Never Used  . Alcohol use No     Allergies   Coconut flavor [flavoring agent]; Coconut oil; and Ibuprofen   Review of Systems Review of Systems  Constitutional: Negative for chills and fever.  Gastrointestinal: Negative for nausea and vomiting.       No bowel or bladder incontinence  Musculoskeletal: Positive for back pain.  Neurological: Negative for weakness and numbness.     Physical Exam Updated Vital Signs BP (!) 147/74 (BP Location: Right Arm)   Pulse 83   Temp 98.5 F (36.9 C) (Oral)   Resp 16   Ht 5\' 10"  (1.778 m)   Wt (!) 307 lb (139.3 kg)   SpO2 97%   BMI 44.05 kg/m   Physical Exam  Constitutional: He  is oriented to person, place, and time. He appears well-developed and well-nourished.  HENT:  Head: Normocephalic and atraumatic.  Neck: Normal range of motion.  Cardiovascular: Normal rate.   Pulmonary/Chest: Effort normal.  Musculoskeletal: Normal range of motion. He exhibits tenderness.  Limited ROM. No midline spinal tenderness. Acute spasm on the right lower lumbar region. Tenderness to anterior right thigh. Normal ROM of right knee.  Neurological: He is alert and oriented to person, place, and time. He displays normal reflexes. No sensory deficit.  Antalgic gait. Normal DTRs. Sensations normal. Strength 5/5 of bilateral lower extremities.  Skin: Skin is warm and dry.  Psychiatric: He has a normal mood and affect. His behavior is normal.  Nursing note and vitals reviewed.    ED Treatments / Results  DIAGNOSTIC STUDIES: Oxygen Saturation is 97% on RA, normal by my interpretation.   COORDINATION OF CARE: 9:44 AM- Will order injection of Toradol. Pt verbalizes understanding and agrees to plan.  Medications  ketorolac (TORADOL) injection 60 mg (60 mg Intramuscular Given 02/20/17 0950)    Labs (all labs ordered are listed, but only abnormal results are displayed) Labs Reviewed - No data to display  EKG  EKG  Interpretation None       Radiology No results found.  Procedures Procedures (including critical care time)  Medications Ordered in ED Medications  ketorolac (TORADOL) injection 60 mg (60 mg Intramuscular Given 02/20/17 0950)     Initial Impression / Assessment and Plan / ED Course  I have reviewed the triage vital signs and the nursing notes.  Pertinent labs & imaging results that were available during my care of the patient were reviewed by me and considered in my medical decision making (see chart for details).     Patient with back pain. No neurological deficits and normal neuro exam. Patient is ambulatory. No loss of bowel or bladder control.  No concern for cauda equina.  No fever, night sweats, weight loss, h/o cancer, IVDA, no recent procedure to back. No urinary symptoms suggestive of UTI.  Supportive care and return precaution discussed. Appears safe for discharge at this time. Follow up as indicated in discharge paperwork.    Final Clinical Impressions(s) / ED Diagnoses   Final diagnoses:  None    New Prescriptions New Prescriptions   No medications on file   I personally performed the services described in this documentation, which was scribed in my presence. The recorded information has been reviewed and is accurate.      Juan Mail, PA-C 02/20/17 [place], MD 02/21/17 [phone]

## 2017-02-28 ENCOUNTER — Ambulatory Visit: Payer: Medicaid Other | Admitting: Cardiology

## 2017-03-24 ENCOUNTER — Encounter: Payer: Medicaid Other | Admitting: Internal Medicine

## 2017-03-27 ENCOUNTER — Emergency Department (HOSPITAL_COMMUNITY)
Admission: EM | Admit: 2017-03-27 | Discharge: 2017-03-27 | Disposition: A | Discharge status: Home / Self Care | Payer: Medicaid Other | Attending: Emergency Medicine | Admitting: Emergency Medicine

## 2017-03-27 ENCOUNTER — Encounter (HOSPITAL_COMMUNITY): Payer: Self-pay | Admitting: Emergency Medicine

## 2017-03-27 ENCOUNTER — Emergency Department (HOSPITAL_COMMUNITY): Payer: Medicaid Other

## 2017-03-27 DIAGNOSIS — M5441 Lumbago with sciatica, right side: Secondary | ICD-10-CM | POA: Diagnosis not present

## 2017-03-27 DIAGNOSIS — M545 Low back pain: Secondary | ICD-10-CM | POA: Diagnosis present

## 2017-03-27 DIAGNOSIS — H1131 Conjunctival hemorrhage, right eye: Secondary | ICD-10-CM | POA: Insufficient documentation

## 2017-03-27 DIAGNOSIS — Z7982 Long term (current) use of aspirin: Secondary | ICD-10-CM | POA: Diagnosis not present

## 2017-03-27 DIAGNOSIS — R51 Headache: Secondary | ICD-10-CM | POA: Diagnosis not present

## 2017-03-27 DIAGNOSIS — E1140 Type 2 diabetes mellitus with diabetic neuropathy, unspecified: Secondary | ICD-10-CM | POA: Diagnosis not present

## 2017-03-27 DIAGNOSIS — Z8673 Personal history of transient ischemic attack (TIA), and cerebral infarction without residual deficits: Secondary | ICD-10-CM | POA: Diagnosis not present

## 2017-03-27 DIAGNOSIS — G8929 Other chronic pain: Secondary | ICD-10-CM | POA: Diagnosis not present

## 2017-03-27 DIAGNOSIS — R519 Headache, unspecified: Secondary | ICD-10-CM

## 2017-03-27 DIAGNOSIS — Z7984 Long term (current) use of oral hypoglycemic drugs: Secondary | ICD-10-CM | POA: Insufficient documentation

## 2017-03-27 DIAGNOSIS — Z79899 Other long term (current) drug therapy: Secondary | ICD-10-CM | POA: Diagnosis not present

## 2017-03-27 DIAGNOSIS — I2510 Atherosclerotic heart disease of native coronary artery without angina pectoris: Secondary | ICD-10-CM | POA: Diagnosis not present

## 2017-03-27 HISTORY — DX: Cerebral infarction, unspecified: I63.9

## 2017-03-27 HISTORY — DX: Acute embolism and thrombosis of unspecified deep veins of unspecified lower extremity: I82.409

## 2017-03-27 HISTORY — DX: Disorder of kidney and ureter, unspecified: N28.9

## 2017-03-27 MED ORDER — KETOROLAC TROMETHAMINE 30 MG/ML IJ SOLN
60.0000 mg | Freq: Once | INTRAMUSCULAR | Status: AC
  Administered 2017-03-27: 60 mg via INTRAMUSCULAR
  Filled 2017-03-27: qty 2

## 2017-03-27 MED ORDER — KETOROLAC TROMETHAMINE 30 MG/ML IJ SOLN: 30.0000 mg | Freq: Once | INTRAMUSCULAR | Status: DC

## 2017-03-27 MED ORDER — TETRACAINE HCL 0.5 % OP SOLN
1.0000 [drp] | Freq: Once | OPHTHALMIC | Status: AC
  Administered 2017-03-27: 1 [drp] via OPHTHALMIC
  Filled 2017-03-27: qty 2

## 2017-03-27 MED ORDER — TIMOLOL MALEATE 0.5 % OP SOLN: 1.0000 [drp] | Freq: Two times a day (BID) | OPHTHALMIC | 0 refills | Status: DC

## 2017-03-27 NOTE — ED Notes (Signed)
Declined W/C at D/C and was escorted to lobby by RN. 

## 2017-03-27 NOTE — ED Notes (Signed)
Pt reports right leg and hip pain ongoing.  Pt states he normally goes to a pain clinic for a shot that starts with a "T" in [facility] but has moved to [place] and cannot get to [facility] for further pain management. NAD, A&O.

## 2017-03-27 NOTE — ED Provider Notes (Signed)
[facility] DEPT Provider Note   CSN: [id number] Arrival date & time: 03/27/17  1226  By signing my name below, I, Mayer Masker, attest that this documentation has been prepared under the direction and in the presence of Janetta Hora, PA-C. Electronically Signed: Mayer Masker, Scribe. 03/27/17. 2:26 PM.  History   Chief Complaint Chief Complaint  Patient presents with  . Leg Pain  . Hip Pain   The history is provided by the patient. No language interpreter was used.    HPI Comments: [name] is a 56 y.o. male with chronic pain who presents to the Emergency Department complaining of constant, chronic lower back, right leg, and hip pain that has worsened over the course of the last 3 days. He states he had associated numbness in his right toe, but this resolved. He has tried muscle relaxers at home with no relief. He denies any injury or trauma to the area. He has an appointment with his orthopedist in 2 days. Pt is on medication for his HTN and notes he stopped taking blood thinners 2 weeks ago due to a surgery. No leg weakness, bowel/bladder incontinence, saddle anesthesia. He states when the pain gets this bad he comes to get a shot of Toradol.  Additionally, pt also complains of a sudden onset, right-sided headache that began earlier this morning. He has associated blood and pain in his right eye and sneezing. Pt has had migraines in the past but states this is not similar to his current symptoms. He denies vision changes beyond baseline and cough. Pt denies glaucoma but does have an ophthalmologist in [facility] for his diabetes. He denies that this is the worst headache he's ever had. No fever, LOC, trauma, neck stiffness.  Past Medical History:  Diagnosis Date  . Arthritis   . Back pain   . CAD (coronary artery disease)    a. s/p DES to LAD in 05/2016  . DVT (deep venous thrombosis) ([place])   . Hyperlipidemia   . Hypertension   . IBS (irritable bowel syndrome)   . OSA  (obstructive sleep apnea)   . Pancreatitis   . PE (pulmonary thromboembolism) ([place])   . PUD (peptic ulcer disease)   . Renal disorder   . Stroke ([place])   . Type 2 diabetes mellitus [facility])     Patient Active Problem List   Diagnosis Date Noted  . Chronic back pain 12/29/2016  . Depression 12/15/2016  . Vitamin D deficiency 12/09/2016  . Right hip pain 12/07/2016  . Hypokalemia 12/07/2016  . Type 2 diabetes mellitus with vascular disease ([place]) 05/31/2016  . Normocytic normochromic anemia 05/31/2016  . CAD (coronary artery disease) 01/06/2016  . DVT (deep venous thrombosis) ([place]) 01/06/2016  . Lactic acidosis 05/28/2014  . Nonspecific chest pain 01/29/2014  . Uncontrolled secondary diabetes with peripheral neuropathy ([place]) 01/29/2014  . Morbid obesity ([place]) 01/29/2014  . Snoring 01/29/2014  . Dyslipidemia 01/29/2014  . HTN (hypertension) 01/29/2014  . Abnormal nuclear stress test 01/29/2014    Past Surgical History:  Procedure Laterality Date  . CARDIAC CATHETERIZATION N/A 05/31/2016   Procedure: Left Heart Cath and Coronary Angiography;  Surgeon: Peter M Martinique, MD;  Location: [place] CV LAB;  Service: Cardiovascular;  Laterality: N/A;  . CARDIAC CATHETERIZATION N/A 05/31/2016   Procedure: Intravascular Pressure Wire/FFR Study;  Surgeon: Peter M Martinique, MD;  Location: [place] CV LAB;  Service: Cardiovascular;  Laterality: N/A;  . CARDIAC CATHETERIZATION N/A 05/31/2016   Procedure: Coronary Stent Intervention;  Surgeon: Peter M Martinique, MD;  Location: [place] CV LAB;  Service: Cardiovascular;  Laterality: N/A;  . LEFT HEART CATHETERIZATION WITH CORONARY ANGIOGRAM N/A 02/03/2014   Procedure: LEFT HEART CATHETERIZATION WITH CORONARY ANGIOGRAM;  Surgeon: Pixie Casino, MD;  Location: [facility] CATH LAB;  Service: Cardiovascular;  Laterality: N/A;  . left leg stent          Home Medications    Prior to Admission medications   Medication Sig Start Date End Date Taking?  Authorizing Provider  aspirin 81 MG chewable tablet Chew 1 tablet (81 mg total) by mouth daily. 06/01/16   Elwin Mocha, MD  atorvastatin (LIPITOR) 80 MG tablet Take 1 tablet (80 mg total) by mouth daily. 01/26/17   Strader, Fransisco Hertz, PA-C  canagliflozin (INVOKANA) 100 MG TABS tablet Take 1 tablet (100 mg total) by mouth daily before breakfast. 12/01/16   Ledell Noss, MD  carvedilol (COREG) 25 MG tablet Take 1 tablet (25 mg total) by mouth 2 (two) times daily with a meal. 01/26/17   Strader, [place] M, PA-C  clopidogrel (PLAVIX) 75 MG tablet Take 75 mg by mouth daily.    [provider]  famotidine (PEPCID) 20 MG tablet Take 20 mg by mouth 2 (two) times daily.    [provider]  furosemide (LASIX) 40 MG tablet Take 1 tablet (40 mg total) by mouth 2 (two) times daily. 01/26/17   Strader, Fransisco Hertz, PA-C  gabapentin (NEURONTIN) 300 MG capsule Take 600 mg by mouth 4 (four) times daily.     [provider]  glipiZIDE (GLUCOTROL) 10 MG tablet Take 1 tablet by mouth 2 (two) times daily. 11/13/15   [provider]  hydrALAZINE (APRESOLINE) 50 MG tablet Take 50 mg by mouth 2 (two) times daily.    [provider]  isosorbide dinitrate (ISORDIL) 30 MG tablet Take 1 tablet (30 mg total) by mouth 2 (two) times daily. 11/22/16   Strader, Fransisco Hertz, PA-C  metFORMIN (GLUCOPHAGE) 1000 MG tablet Take 1,000 mg by mouth 2 (two) times daily with a meal.    [provider]  methocarbamol (ROBAXIN) 750 MG tablet Take 750 mg by mouth 3 (three) times daily as needed for muscle spasms.  10/12/16   [provider]  nitroGLYCERIN (NITROSTAT) 0.4 MG SL tablet Place 0.4 mg under the tongue every 5 (five) minutes as needed for chest pain. 12/17/16   [provider]  pantoprazole (PROTONIX) 20 MG tablet Take 1 tablet (20 mg total) by mouth 2 (two) times daily. 01/22/17   Gareth Morgan, MD  potassium chloride SA (K-DUR,KLOR-CON) 20 MEQ tablet Take 20 mEq by mouth  daily.    [provider]  sertraline (ZOLOFT) 50 MG tablet Take 50 mg by mouth daily.    [provider]  sitaGLIPtin (JANUVIA) 100 MG tablet Take 100 mg by mouth daily.    [provider]  traZODone (DESYREL) 50 MG tablet Take 1 tablet (50 mg total) by mouth at bedtime. 12/15/16   Florinda Marker, MD    Family History Family History  Problem Relation Age of Onset  . Cancer Father   . Hypertension Mother   . Diabetes Mother   . Breast cancer Mother   . Hypertension Brother   . Diabetes Brother   . Hypertension Sister   . Diabetes Sister     Social History Social History  Substance Use Topics  . Smoking status: Never Smoker  . Smokeless tobacco: Never Used  .  Alcohol use No     Allergies   Coconut flavor [flavoring agent]; Coconut oil; and Ibuprofen   Review of Systems Review of Systems  Constitutional: Negative for fever.  HENT: Positive for sneezing.   Eyes: Positive for pain and redness. Negative for visual disturbance.  Musculoskeletal: Positive for arthralgias, back pain and myalgias.  Neurological: Positive for numbness and headaches.  All other systems reviewed and are negative.    Physical Exam Updated Vital Signs BP (!) 146/86 (BP Location: Right Arm)   Pulse 93   Temp 98.5 F (36.9 C) (Oral)   Resp 20   Ht 5\' 10"  (1.778 m)   Wt (!) 310 lb (140.6 kg)   SpO2 97%   BMI 44.48 kg/m   Physical Exam  Constitutional: He is oriented to person, place, and time. He appears well-developed and well-nourished. No distress.  Calm, comfortable, pleasant  HENT:  Head: Normocephalic and atraumatic.  Eyes: EOM and lids are normal. Pupils are equal, round, and reactive to light. Right eye exhibits no discharge. Left eye exhibits no discharge. Right conjunctiva is not injected. Right conjunctiva has a hemorrhage (small subconjunctival hemorrhage in 9 o clock position). Left conjunctiva is not injected. Left conjunctiva has no hemorrhage. No  scleral icterus.  L eye IOP: 20 R eye IOP: 25, 22    Neck: Normal range of motion.  Cardiovascular: Normal rate.   Pulmonary/Chest: Effort normal. No respiratory distress.  Abdominal: He exhibits no distension.  Musculoskeletal:  Back: Inspection: No masses, deformity, or rash Palpation: Lumbar tenderness with right sided paraspinal tenderness Strength: 5/5 in lower extremities and normal plantar and dorsiflexion Sensation: Intact sensation with light touch in lower extremities bilaterally  Neurological: He is alert and oriented to person, place, and time.  Lying on stretcher in NAD. GCS 15. Speaks in a clear voice. Cranial nerves II through XII grossly intact. 5/5 strength in all extremities. Sensation fully intact.  Bilateral finger-to-nose intact. Ambulatory with limp and cane   Skin: Skin is warm and dry.  Psychiatric: He has a normal mood and affect. His behavior is normal.  Nursing note and vitals reviewed.    ED Treatments / Results  DIAGNOSTIC STUDIES: Oxygen Saturation is 97% on RA, normal by my interpretation.    COORDINATION OF CARE: 1:54 PM Discussed treatment plan with pt at bedside and pt agreed to plan.  Labs (all labs ordered are listed, but only abnormal results are displayed) Labs Reviewed - No data to display  EKG  EKG Interpretation None       Radiology No results found.  Procedures Procedures (including critical care time)  Medications Ordered in ED Medications - No data to display   Initial Impression / Assessment and Plan / ED Course  I have reviewed the triage vital signs and the nursing notes.  Pertinent labs & imaging results that were available during my care of the patient were reviewed by me and considered in my medical decision making (see chart for details).  56 year old with acute on chronic back pain as well as right sided headache with associated subconjunctival hemorrhage. He is mildly hypertensive but otherwise vitals are  normal. CT of head obtained for new headache which was negative. IOP was borderline elevated. Discussed with Dr. Wilson Singer. Will rx Timolol drops and advised follow up with ophthalmologist. Will give referral to one here in [place] since he has transportation issues.  Back pain is similar to prior episodes of worsening back pain. No red flags. Toradol  given and discussed other supportive measures. He has a follow up with his doctor in a couple days. Return precautions given.  Final Clinical Impressions(s) / ED Diagnoses   Final diagnoses:  Right-sided headache  Subconjunctival hemorrhage of right eye  Chronic right-sided low back pain with right-sided sciatica    New Prescriptions New Prescriptions   No medications on file   I personally performed the services described in this documentation, which was scribed in my presence. The recorded information has been reviewed and is accurate.     Recardo Evangelist, PA-C 03/29/17 1211    Virgel Manifold, MD 04/03/17 [phone]

## 2017-03-27 NOTE — Discharge Instructions (Signed)
Please follow up with your doctor regarding your back pain/hip pain Use eye drops twice a day in each eye until you can follow up with eye doctor Return for worsening symptoms

## 2017-04-09 DIAGNOSIS — M159 Polyosteoarthritis, unspecified: Secondary | ICD-10-CM | POA: Insufficient documentation

## 2017-04-20 ENCOUNTER — Emergency Department (HOSPITAL_COMMUNITY): Payer: Medicaid Other

## 2017-04-20 ENCOUNTER — Encounter (HOSPITAL_COMMUNITY): Payer: Self-pay | Admitting: Emergency Medicine

## 2017-04-20 ENCOUNTER — Other Ambulatory Visit: Payer: Self-pay

## 2017-04-20 ENCOUNTER — Emergency Department (HOSPITAL_COMMUNITY)
Admission: EM | Admit: 2017-04-20 | Discharge: 2017-04-20 | Disposition: A | Discharge status: Home / Self Care | Payer: Medicaid Other | Attending: Emergency Medicine | Admitting: Emergency Medicine

## 2017-04-20 DIAGNOSIS — Z955 Presence of coronary angioplasty implant and graft: Secondary | ICD-10-CM | POA: Insufficient documentation

## 2017-04-20 DIAGNOSIS — M5441 Lumbago with sciatica, right side: Secondary | ICD-10-CM | POA: Diagnosis not present

## 2017-04-20 DIAGNOSIS — Z7982 Long term (current) use of aspirin: Secondary | ICD-10-CM | POA: Insufficient documentation

## 2017-04-20 DIAGNOSIS — M79605 Pain in left leg: Secondary | ICD-10-CM

## 2017-04-20 DIAGNOSIS — E1140 Type 2 diabetes mellitus with diabetic neuropathy, unspecified: Secondary | ICD-10-CM | POA: Diagnosis not present

## 2017-04-20 DIAGNOSIS — M7989 Other specified soft tissue disorders: Secondary | ICD-10-CM | POA: Diagnosis present

## 2017-04-20 DIAGNOSIS — G8929 Other chronic pain: Secondary | ICD-10-CM | POA: Diagnosis not present

## 2017-04-20 DIAGNOSIS — R600 Localized edema: Secondary | ICD-10-CM | POA: Insufficient documentation

## 2017-04-20 DIAGNOSIS — Z8673 Personal history of transient ischemic attack (TIA), and cerebral infarction without residual deficits: Secondary | ICD-10-CM | POA: Diagnosis not present

## 2017-04-20 DIAGNOSIS — Z7902 Long term (current) use of antithrombotics/antiplatelets: Secondary | ICD-10-CM | POA: Diagnosis not present

## 2017-04-20 DIAGNOSIS — I2510 Atherosclerotic heart disease of native coronary artery without angina pectoris: Secondary | ICD-10-CM | POA: Diagnosis not present

## 2017-04-20 DIAGNOSIS — I10 Essential (primary) hypertension: Secondary | ICD-10-CM | POA: Diagnosis not present

## 2017-04-20 DIAGNOSIS — Z7984 Long term (current) use of oral hypoglycemic drugs: Secondary | ICD-10-CM | POA: Diagnosis not present

## 2017-04-20 DIAGNOSIS — R0789 Other chest pain: Secondary | ICD-10-CM | POA: Insufficient documentation

## 2017-04-20 LAB — CBC
HCT: 35.4 % — ABNORMAL LOW (ref 39.0–52.0)
Hemoglobin: 11.2 g/dL — ABNORMAL LOW (ref 13.0–17.0)
MCH: 28.1 pg (ref 26.0–34.0)
MCHC: 31.6 g/dL (ref 30.0–36.0)
MCV: 88.9 fL (ref 78.0–100.0)
Platelets: 239 10*3/uL (ref 150–400)
RBC: 3.98 MIL/uL — ABNORMAL LOW (ref 4.22–5.81)
RDW: 14.4 % (ref 11.5–15.5)
WBC: 9.4 10*3/uL (ref 4.0–10.5)

## 2017-04-20 LAB — I-STAT TROPONIN, ED
Troponin i, poc: 0.01 ng/mL (ref 0.00–0.08)
Troponin i, poc: 0 ng/mL (ref 0.00–0.08)

## 2017-04-20 LAB — BASIC METABOLIC PANEL
Anion gap: 11 (ref 5–15)
BUN: 8 mg/dL (ref 6–20)
CO2: 25 mmol/L (ref 22–32)
Calcium: 8.9 mg/dL (ref 8.9–10.3)
Chloride: 100 mmol/L — ABNORMAL LOW (ref 101–111)
Creatinine, Ser: 0.85 mg/dL (ref 0.61–1.24)
GFR calc Af Amer: >60 mL/min (ref >60)
GFR calc non Af Amer: >60 mL/min (ref >60)
Glucose, Bld: 276 mg/dL — ABNORMAL HIGH (ref 65–99)
Potassium: 3.4 mmol/L — ABNORMAL LOW (ref 3.5–5.1)
Sodium: 136 mmol/L (ref 135–145)

## 2017-04-20 LAB — D-DIMER, QUANTITATIVE (NOT AT ARMC): D-Dimer, Quant: 0.35 ug/mL-FEU (ref 0.00–0.50)

## 2017-04-20 LAB — BRAIN NATRIURETIC PEPTIDE: B Natriuretic Peptide: 14.4 pg/mL (ref 0.0–100.0)

## 2017-04-20 MED ORDER — OXYCODONE-ACETAMINOPHEN 5-325 MG PO TABS
2.0000 | ORAL_TABLET | Freq: Once | ORAL | Status: AC
  Administered 2017-04-20: 2 via ORAL
  Filled 2017-04-20: qty 2

## 2017-04-20 NOTE — ED Notes (Signed)
Called lab and BNP and CBC added on

## 2017-04-20 NOTE — ED Triage Notes (Signed)
Pt states "my legs are swollen really bad, and today I had some chest pains, I was hoping it was gas." pt endorsing sharp left sided chest pain. Pt had stroke and a stent in his heart. Pt had stent placed in august.

## 2017-04-20 NOTE — ED Notes (Signed)
Patient Alert and oriented X4. Stable and ambulatory. Patient verbalized understanding of the discharge instructions.  Patient belongings were taken by the patient.  

## 2017-04-20 NOTE — ED Notes (Signed)
Patient asking for pain medication.  Reviewed chart with triage RN.   Patient states he

## 2017-04-20 NOTE — ED Provider Notes (Signed)
[place] DEPT Provider Note   CSN: [id number] Arrival date & time: 04/20/17  1531     History   Chief Complaint Chief Complaint  Patient presents with  . Chest Pain  . Leg Swelling    HPI [name] is a 56 y.o. male.  HPI   Presents with concern for leg pain and tightness in the chest. Leg problem 4 days ago, bilateral leg swelling, tough to bend down, stand up, both sides.  Chest tightness going on for day and a half. Thought it was gas or too much fluid.  Comes and goes, not positional notes it when doing nothing, reports not exerting self so not sure if it is worse.  No shortness of breath.  Don't lay down flat because of low back pain. Nausea, no vomiting.  Mild abdominal pain, has hx of other troubles. Does not feel like prior MI.  Reports right-sided low back pain. He is scheduled to see neurosurgery at [facility] because of this. Reports he has been taking oxycodone for it, but does not take it" like he shows" because he would rather treat his pain in other ways. Reports his leg swelling has actually been worse on the left, and that this was assigned to have previous DVT. Reports he is concerned that this may represent new DVT. Also reports bilateral knee pain.  Past Medical History:  Diagnosis Date  . Arthritis   . Back pain   . CAD (coronary artery disease)    a. s/p DES to LAD in 05/2016  . DVT (deep venous thrombosis) ([place])   . Hyperlipidemia   . Hypertension   . IBS (irritable bowel syndrome)   . OSA (obstructive sleep apnea)   . Pancreatitis   . PE (pulmonary thromboembolism) ([place])   . PUD (peptic ulcer disease)   . Renal disorder   . Stroke ([place])   . Type 2 diabetes mellitus [facility])     Patient Active Problem List   Diagnosis Date Noted  . Chronic back pain 12/29/2016  . Depression 12/15/2016  . Vitamin D deficiency 12/09/2016  . Right hip pain 12/07/2016  . Hypokalemia 12/07/2016  . Type 2 diabetes mellitus with vascular disease ([place]) 05/31/2016   . Normocytic normochromic anemia 05/31/2016  . CAD (coronary artery disease) 01/06/2016  . DVT (deep venous thrombosis) ([place]) 01/06/2016  . Lactic acidosis 05/28/2014  . Nonspecific chest pain 01/29/2014  . Uncontrolled secondary diabetes with peripheral neuropathy ([place]) 01/29/2014  . Morbid obesity ([place]) 01/29/2014  . Snoring 01/29/2014  . Dyslipidemia 01/29/2014  . HTN (hypertension) 01/29/2014  . Abnormal nuclear stress test 01/29/2014    Past Surgical History:  Procedure Laterality Date  . CARDIAC CATHETERIZATION N/A 05/31/2016   Procedure: Left Heart Cath and Coronary Angiography;  Surgeon: Peter M Martinique, MD;  Location: [place] CV LAB;  Service: Cardiovascular;  Laterality: N/A;  . CARDIAC CATHETERIZATION N/A 05/31/2016   Procedure: Intravascular Pressure Wire/FFR Study;  Surgeon: Peter M Martinique, MD;  Location: [place] CV LAB;  Service: Cardiovascular;  Laterality: N/A;  . CARDIAC CATHETERIZATION N/A 05/31/2016   Procedure: Coronary Stent Intervention;  Surgeon: Peter M Martinique, MD;  Location: [place] CV LAB;  Service: Cardiovascular;  Laterality: N/A;  . LEFT HEART CATHETERIZATION WITH CORONARY ANGIOGRAM N/A 02/03/2014   Procedure: LEFT HEART CATHETERIZATION WITH CORONARY ANGIOGRAM;  Surgeon: Pixie Casino, MD;  Location: [facility] CATH LAB;  Service: Cardiovascular;  Laterality: N/A;  . left leg stent  Home Medications    Prior to Admission medications   Medication Sig Start Date End Date Taking? Authorizing Provider  amLODipine (NORVASC) 10 MG tablet Take 10 mg by mouth daily.   Yes [provider]  aspirin 81 MG chewable tablet Chew 1 tablet (81 mg total) by mouth daily. 06/01/16  Yes Elwin Mocha, MD  atorvastatin (LIPITOR) 80 MG tablet Take 1 tablet (80 mg total) by mouth daily. 01/26/17  Yes Strader, Fransisco Hertz, PA-C  carvedilol (COREG) 25 MG tablet Take 1 tablet (25 mg total) by mouth 2 (two) times daily with a meal. 01/26/17  Yes Strader, [place]  M, PA-C  clopidogrel (PLAVIX) 75 MG tablet Take 75 mg by mouth daily.   Yes [provider]  famotidine (PEPCID) 20 MG tablet Take 20 mg by mouth 2 (two) times daily.   Yes [provider]  furosemide (LASIX) 40 MG tablet Take 1 tablet (40 mg total) by mouth 2 (two) times daily. 01/26/17  Yes Strader, [place] M, PA-C  glipiZIDE (GLUCOTROL) 10 MG tablet Take 10 mg by mouth 2 (two) times daily.  11/13/15  Yes [provider]  hydrALAZINE (APRESOLINE) 50 MG tablet Take 50 mg by mouth 2 (two) times daily.   Yes [provider]  isosorbide dinitrate (ISORDIL) 30 MG tablet Take 1 tablet (30 mg total) by mouth 2 (two) times daily. 11/22/16  Yes Strader, [place] M, PA-C  LYRICA 75 MG capsule Take 75 mg by mouth 2 (two) times daily. 04/03/17  Yes [provider]  metFORMIN (GLUCOPHAGE-XR) 500 MG 24 hr tablet Take 1,000 mg by mouth 2 (two) times daily. 04/14/17  Yes [provider]  nitroGLYCERIN (NITROSTAT) 0.4 MG SL tablet Place 0.4 mg under the tongue every 5 (five) minutes as needed for chest pain. 12/17/16  Yes [provider]  Oxycodone HCl 10 MG TABS Take 10 mg by mouth 3 (three) times daily as needed (for pain).  04/18/17  Yes [provider]  pantoprazole (PROTONIX) 20 MG tablet Take 1 tablet (20 mg total) by mouth 2 (two) times daily. 01/22/17  Yes Gareth Morgan, MD  potassium chloride SA (K-DUR,KLOR-CON) 20 MEQ tablet Take 20 mEq by mouth daily.   Yes [provider]  sertraline (ZOLOFT) 50 MG tablet Take 50 mg by mouth daily.   Yes [provider]  sitaGLIPtin (JANUVIA) 100 MG tablet Take 100 mg by mouth daily.   Yes [provider]  tiZANidine (ZANAFLEX) 2 MG tablet Take 2 mg by mouth 2 (two) times daily as needed for muscle spasms.  04/18/17  Yes [provider]  traZODone (DESYREL) 50 MG tablet Take 1 tablet (50 mg total) by mouth at bedtime. 12/15/16  Yes Burns, Arloa Koh, MD  canagliflozin  (INVOKANA) 100 MG TABS tablet Take 1 tablet (100 mg total) by mouth daily before breakfast. Patient not taking: Reported on 04/20/2017 12/01/16   Ledell Noss, MD  timolol (TIMOPTIC) 0.5 % ophthalmic solution Place 1 drop into both eyes 2 (two) times daily. Patient not taking: Reported on 04/20/2017 03/27/17   Recardo Evangelist, PA-C    Family History Family History  Problem Relation Age of Onset  . Cancer Father   . Hypertension Mother   . Diabetes Mother   . Breast cancer Mother   . Hypertension Brother   . Diabetes Brother   . Hypertension Sister   . Diabetes Sister     Social History Social History  Substance Use Topics  . Smoking  status: Never Smoker  . Smokeless tobacco: Never Used  . Alcohol use No     Allergies   Coconut flavor [flavoring agent]; Coconut oil; and Ibuprofen   Review of Systems Review of Systems  Constitutional: Negative for fever.  HENT: Negative for sore throat.   Eyes: Negative for visual disturbance.  Respiratory: Negative for shortness of breath.   Cardiovascular: Positive for chest pain and leg swelling.  Gastrointestinal: Positive for nausea. Negative for abdominal pain, diarrhea and vomiting.  Genitourinary: Negative for difficulty urinating and dysuria.  Musculoskeletal: Positive for arthralgias, back pain and myalgias. Negative for neck stiffness.  Skin: Negative for rash.  Neurological: Positive for headaches. Negative for syncope.     Physical Exam Updated Vital Signs BP 135/66   Pulse 70   Temp 98.4 F (36.9 C) (Oral)   Resp 16   Ht 5\' 10"  (1.778 m)   Wt (!) 147.7 kg (325 lb 11.2 oz)   SpO2 98%   BMI 46.73 kg/m   Physical Exam  Constitutional: He is oriented to person, place, and time. He appears well-developed and well-nourished. No distress.  HENT:  Head: Normocephalic and atraumatic.  Eyes: Conjunctivae and EOM are normal.  Neck: Normal range of motion.  Cardiovascular: Normal rate, regular rhythm, normal heart sounds  and intact distal pulses.  Exam reveals no gallop and no friction rub.   No murmur heard. Pulmonary/Chest: Effort normal and breath sounds normal. No respiratory distress. He has no wheezes. He has no rales.  Abdominal: Soft. He exhibits no distension. There is no tenderness. There is no guarding.  Musculoskeletal: He exhibits edema (trace bilaterally, removed compression stockings).       Lumbar back: He exhibits tenderness. He exhibits no bony tenderness.  Neurological: He is alert and oriented to person, place, and time. He has normal strength. No sensory deficit. GCS eye subscore is 4. GCS verbal subscore is 5. GCS motor subscore is 6.  Skin: Skin is warm and dry. He is not diaphoretic.  Nursing note and vitals reviewed.    ED Treatments / Results  Labs (all labs ordered are listed, but only abnormal results are displayed) Labs Reviewed  BASIC METABOLIC PANEL - Abnormal; Notable for the following:       Result Value   Potassium 3.4 (*)    Chloride 100 (*)    Glucose, Bld 276 (*)    All other components within normal limits  CBC - Abnormal; Notable for the following:    RBC 3.98 (*)    Hemoglobin 11.2 (*)    HCT 35.4 (*)    All other components within normal limits  D-DIMER, QUANTITATIVE (NOT AT [facility])  BRAIN NATRIURETIC PEPTIDE  I-STAT TROPOININ, ED  I-STAT TROPOININ, ED    EKG  EKG Interpretation  Date/Time:  Thursday April 20 2017 15:37:34 EDT Ventricular Rate:  77 PR Interval:  162 QRS Duration: 80 QT Interval:  406 QTC Calculation: 459 R Axis:   52 Text Interpretation:  Normal sinus rhythm Possible Inferior infarct , age undetermined Anterior infarct , age undetermined Abnormal ECG No significant change since last tracing Confirmed by Gareth Morgan [phone]) on 04/20/2017 6:16:08 PM       Radiology Dg Chest 2 View  Result Date: 04/20/2017 CLINICAL DATA:  Bilateral lower extremity swelling over the last 4 days. EXAM: CHEST  2 VIEW COMPARISON:  12/03/2016  FINDINGS: Heart size at the upper limits of normal. Coronary stents evident. Mediastinal shadows otherwise normal. There is no pulmonary edema  or pleural effusion. Mild chronic scarring at the lung bases. IMPRESSION: No gross congestive heart failure by radiography. Mild basilar pulmonary scarring. Electronically Signed   By: Nelson Chimes M.D.   On: 04/20/2017 16:10    Procedures Procedures (including critical care time)  Medications Ordered in ED Medications  oxyCODONE-acetaminophen (PERCOCET/ROXICET) 5-325 MG per tablet 2 tablet (2 tablets Oral Given 04/20/17 1931)     Initial Impression / Assessment and Plan / ED Course  I have reviewed the triage vital signs and the nursing notes.  Pertinent labs & imaging results that were available during my care of the patient were reviewed by me and considered in my medical decision making (see chart for details).     56 year old male with a history of coronary artery disease, DVT, hypertension, hyperlipidemia, peptic ulcer disease, diabetes, CVA, chronic back pain presents with concern for bilateral leg swelling, with concern that left is worse than right, platelet of the legs, chest tightness for 1.5 days.  EKG showsa began change from last. Delta troponins negative. Discussed with patient that it is reasonable to observe him given his history of coronary artery disease, however given this does not feel like his typical cardiac pain, is not exertional, feel outpatient follow-up is also reasonable.  D-dimer is negative, and have low suspicion for pulmonary embolus, and overall low suspicion for DVT. However, given patient concerns, possibility of false negative ddimer, DVT ultrasound scheduled for tomorrow. Will not order empiric anticoagulation given negative d-dimer.   Pt with good pulses bilateral upper and lower extremities, doubt dissection or acute arterial occlusion.   Suspect patient's pain in legs is likely multifactorial, with hx of spinal  stenosis, right sided sciatica, and suspect arthritis in bilateral knees.  Offered lidocaine patch, however patient reports he'll continue to follow up with his pain physician, PCP, as well as neurosurgeon as scheduled at [place].  He will return tomorrow for left lower extension of the ultrasound. Patient discharged in stable condition with understanding of reasons to return.    Final Clinical Impressions(s) / ED Diagnoses   Final diagnoses:  Bilateral lower extremity edema  Left leg pain  Chronic right-sided low back pain with right-sided sciatica    New Prescriptions Discharge Medication List as of 04/20/2017 11:28 PM       Gareth Morgan, MD 04/21/17 0107

## 2017-04-21 ENCOUNTER — Ambulatory Visit (HOSPITAL_COMMUNITY)
Admission: RE | Admit: 2017-04-21 | Discharge: 2017-04-21 | Disposition: A | Discharge status: Home / Self Care | Payer: Medicaid Other | Source: Ambulatory Visit | Attending: Emergency Medicine | Admitting: Emergency Medicine

## 2017-04-21 DIAGNOSIS — M7989 Other specified soft tissue disorders: Secondary | ICD-10-CM | POA: Insufficient documentation

## 2017-04-21 DIAGNOSIS — M79605 Pain in left leg: Secondary | ICD-10-CM | POA: Insufficient documentation

## 2017-04-21 NOTE — Progress Notes (Signed)
VASCULAR LAB PRELIMINARY  PRELIMINARY  PRELIMINARY  PRELIMINARY  Left lower extremity venous duplex completed.    Preliminary report:  Left:  No obviousevidence of DVT unable to visualize the peroneal veins well enough to evaluate, superficial thrombosis, or Baker's cyst.  Kenden Brandt, RVS 04/21/2017, 8:40 AM

## 2017-04-22 ENCOUNTER — Emergency Department (HOSPITAL_COMMUNITY)
Admission: EM | Admit: 2017-04-22 | Discharge: 2017-04-22 | Disposition: A | Discharge status: Home / Self Care | Payer: Medicaid Other | Attending: Emergency Medicine | Admitting: Emergency Medicine

## 2017-04-22 ENCOUNTER — Encounter (HOSPITAL_COMMUNITY): Payer: Self-pay | Admitting: *Deleted

## 2017-04-22 DIAGNOSIS — I2510 Atherosclerotic heart disease of native coronary artery without angina pectoris: Secondary | ICD-10-CM | POA: Diagnosis not present

## 2017-04-22 DIAGNOSIS — R2243 Localized swelling, mass and lump, lower limb, bilateral: Secondary | ICD-10-CM | POA: Insufficient documentation

## 2017-04-22 DIAGNOSIS — E1140 Type 2 diabetes mellitus with diabetic neuropathy, unspecified: Secondary | ICD-10-CM | POA: Diagnosis not present

## 2017-04-22 DIAGNOSIS — Z7984 Long term (current) use of oral hypoglycemic drugs: Secondary | ICD-10-CM | POA: Diagnosis not present

## 2017-04-22 DIAGNOSIS — I10 Essential (primary) hypertension: Secondary | ICD-10-CM | POA: Diagnosis not present

## 2017-04-22 DIAGNOSIS — Z79899 Other long term (current) drug therapy: Secondary | ICD-10-CM | POA: Diagnosis not present

## 2017-04-22 DIAGNOSIS — R600 Localized edema: Secondary | ICD-10-CM

## 2017-04-22 LAB — BASIC METABOLIC PANEL
Anion gap: 10 (ref 5–15)
BUN: 8 mg/dL (ref 6–20)
CO2: 28 mmol/L (ref 22–32)
Calcium: 8.6 mg/dL — ABNORMAL LOW (ref 8.9–10.3)
Chloride: 99 mmol/L — ABNORMAL LOW (ref 101–111)
Creatinine, Ser: 0.81 mg/dL (ref 0.61–1.24)
GFR calc Af Amer: >60 mL/min (ref >60)
GFR calc non Af Amer: >60 mL/min (ref >60)
Glucose, Bld: 221 mg/dL — ABNORMAL HIGH (ref 65–99)
Potassium: 3.1 mmol/L — ABNORMAL LOW (ref 3.5–5.1)
Sodium: 137 mmol/L (ref 135–145)

## 2017-04-22 MED ORDER — OXYCODONE-ACETAMINOPHEN 5-325 MG PO TABS
2.0000 | ORAL_TABLET | Freq: Once | ORAL | Status: AC
  Administered 2017-04-22: 2 via ORAL
  Filled 2017-04-22: qty 2

## 2017-04-22 NOTE — Discharge Instructions (Addendum)
As discussed, wear your compression stockings and keep legs elevated. Follow up with your primary care office regarding management of lasix dosing. Take your prescribed dose of Lasix and call your doctor's office before modifying doses. Your kidney function was normal today.  Return if you experience chest pain, shortness of breath, redness that persist, warmth to the touch, fever, purulent discharge or other new concerning symptoms in the meantime.

## 2017-04-22 NOTE — ED Triage Notes (Signed)
Pt reports being seen on 6/28 for same. Having bilateral leg swelling. No sob. Reports having vascular study done yesterday and it was negative. Returned today due to still having the swelling. No acute distress is noted at triage.

## 2017-04-22 NOTE — ED Notes (Signed)
Pt verbalized understanding discharge instructions and denies any further needs or questions at this time. VS stable, ambulatory and steady gait.   

## 2017-04-22 NOTE — ED Provider Notes (Signed)
[place] DEPT Provider Note   CSN: [id number] Arrival date & time: 04/22/17  1236     History   Chief Complaint Chief Complaint  Patient presents with  . Leg Swelling    HPI [name] is a 56 y.o. male presenting with bilateral peripheral edema. Patient explains that he's been having this for months and was seen 2 days ago for same. He had an ultrasound yesterday which was negative for DVT. He denies any worsening other than his left foot having a little bit more swelling. He was unable to get to his PCP office so he increased his Lasix dose to 120 mg. He explains that he has not been wearing his compression stockings for the last 2 days. He reports that he has tried to elevate his legs. He cannot lay fully flatten truly have his legs up higher due to back pain. He denies chest pain, shortness of breath, fever, chills, nausea, vomiting, unilateral leg swelling, erythema, warmth or purulence.  HPI  Past Medical History:  Diagnosis Date  . Arthritis   . Back pain   . CAD (coronary artery disease)    a. s/p DES to LAD in 05/2016  . DVT (deep venous thrombosis) ([place])   . Hyperlipidemia   . Hypertension   . IBS (irritable bowel syndrome)   . OSA (obstructive sleep apnea)   . Pancreatitis   . PE (pulmonary thromboembolism) ([place])   . PUD (peptic ulcer disease)   . Renal disorder   . Stroke ([place])   . Type 2 diabetes mellitus [facility])     Patient Active Problem List   Diagnosis Date Noted  . Chronic back pain 12/29/2016  . Depression 12/15/2016  . Vitamin D deficiency 12/09/2016  . Right hip pain 12/07/2016  . Hypokalemia 12/07/2016  . Type 2 diabetes mellitus with vascular disease ([place]) 05/31/2016  . Normocytic normochromic anemia 05/31/2016  . CAD (coronary artery disease) 01/06/2016  . DVT (deep venous thrombosis) ([place]) 01/06/2016  . Lactic acidosis 05/28/2014  . Nonspecific chest pain 01/29/2014  . Uncontrolled secondary diabetes with peripheral neuropathy  ([place]) 01/29/2014  . Morbid obesity ([place]) 01/29/2014  . Snoring 01/29/2014  . Dyslipidemia 01/29/2014  . HTN (hypertension) 01/29/2014  . Abnormal nuclear stress test 01/29/2014    Past Surgical History:  Procedure Laterality Date  . CARDIAC CATHETERIZATION N/A 05/31/2016   Procedure: Left Heart Cath and Coronary Angiography;  Surgeon: Peter M Martinique, MD;  Location: [place] CV LAB;  Service: Cardiovascular;  Laterality: N/A;  . CARDIAC CATHETERIZATION N/A 05/31/2016   Procedure: Intravascular Pressure Wire/FFR Study;  Surgeon: Peter M Martinique, MD;  Location: [place] CV LAB;  Service: Cardiovascular;  Laterality: N/A;  . CARDIAC CATHETERIZATION N/A 05/31/2016   Procedure: Coronary Stent Intervention;  Surgeon: Peter M Martinique, MD;  Location: [place] CV LAB;  Service: Cardiovascular;  Laterality: N/A;  . LEFT HEART CATHETERIZATION WITH CORONARY ANGIOGRAM N/A 02/03/2014   Procedure: LEFT HEART CATHETERIZATION WITH CORONARY ANGIOGRAM;  Surgeon: Pixie Casino, MD;  Location: [facility] CATH LAB;  Service: Cardiovascular;  Laterality: N/A;  . left leg stent          Home Medications    Prior to Admission medications   Medication Sig Start Date End Date Taking? Authorizing Provider  amLODipine (NORVASC) 10 MG tablet Take 10 mg by mouth daily.    [provider]  aspirin 81 MG chewable tablet Chew 1 tablet (81 mg total) by mouth daily. 06/01/16   Benjaman Lobe  M, MD  atorvastatin (LIPITOR) 80 MG tablet Take 1 tablet (80 mg total) by mouth daily. 01/26/17   Strader, Fransisco Hertz, PA-C  canagliflozin (INVOKANA) 100 MG TABS tablet Take 1 tablet (100 mg total) by mouth daily before breakfast. Patient not taking: Reported on 04/20/2017 12/01/16   Ledell Noss, MD  carvedilol (COREG) 25 MG tablet Take 1 tablet (25 mg total) by mouth 2 (two) times daily with a meal. 01/26/17   Strader, [place] M, PA-C  clopidogrel (PLAVIX) 75 MG tablet Take 75 mg by mouth daily.    [provider]  famotidine  (PEPCID) 20 MG tablet Take 20 mg by mouth 2 (two) times daily.    [provider]  furosemide (LASIX) 40 MG tablet Take 1 tablet (40 mg total) by mouth 2 (two) times daily. 01/26/17   Strader, Fransisco Hertz, PA-C  glipiZIDE (GLUCOTROL) 10 MG tablet Take 10 mg by mouth 2 (two) times daily.  11/13/15   [provider]  hydrALAZINE (APRESOLINE) 50 MG tablet Take 50 mg by mouth 2 (two) times daily.    [provider]  isosorbide dinitrate (ISORDIL) 30 MG tablet Take 1 tablet (30 mg total) by mouth 2 (two) times daily. 11/22/16   Strader, Fransisco Hertz, PA-C  LYRICA 75 MG capsule Take 75 mg by mouth 2 (two) times daily. 04/03/17   [provider]  metFORMIN (GLUCOPHAGE-XR) 500 MG 24 hr tablet Take 1,000 mg by mouth 2 (two) times daily. 04/14/17   [provider]  nitroGLYCERIN (NITROSTAT) 0.4 MG SL tablet Place 0.4 mg under the tongue every 5 (five) minutes as needed for chest pain. 12/17/16   [provider]  Oxycodone HCl 10 MG TABS Take 10 mg by mouth 3 (three) times daily as needed (for pain).  04/18/17   [provider]  pantoprazole (PROTONIX) 20 MG tablet Take 1 tablet (20 mg total) by mouth 2 (two) times daily. 01/22/17   Gareth Morgan, MD  potassium chloride SA (K-DUR,KLOR-CON) 20 MEQ tablet Take 20 mEq by mouth daily.    [provider]  sertraline (ZOLOFT) 50 MG tablet Take 50 mg by mouth daily.    [provider]  sitaGLIPtin (JANUVIA) 100 MG tablet Take 100 mg by mouth daily.    [provider]  timolol (TIMOPTIC) 0.5 % ophthalmic solution Place 1 drop into both eyes 2 (two) times daily. Patient not taking: Reported on 04/20/2017 03/27/17   Recardo Evangelist, PA-C  tiZANidine (ZANAFLEX) 2 MG tablet Take 2 mg by mouth 2 (two) times daily as needed for muscle spasms.  04/18/17   [provider]  traZODone (DESYREL) 50 MG tablet Take 1 tablet (50 mg total) by mouth at bedtime. 12/15/16   Florinda Marker, MD     Family History Family History  Problem Relation Age of Onset  . Cancer Father   . Hypertension Mother   . Diabetes Mother   . Breast cancer Mother   . Hypertension Brother   . Diabetes Brother   . Hypertension Sister   . Diabetes Sister     Social History Social History  Substance Use Topics  . Smoking status: Never Smoker  . Smokeless tobacco: Never Used  . Alcohol use No     Allergies   Coconut flavor [flavoring agent]; Coconut oil; and Ibuprofen   Review of Systems Review of Systems  Constitutional: Negative for chills and fever.  HENT: Negative for ear pain and sore throat.   Eyes: Negative for  pain and visual disturbance.  Respiratory: Negative for cough, shortness of breath, wheezing and stridor.   Cardiovascular: Positive for leg swelling. Negative for chest pain and palpitations.  Gastrointestinal: Negative for abdominal pain, diarrhea, nausea and vomiting.  Genitourinary: Negative for dysuria and hematuria.  Musculoskeletal: Positive for back pain. Negative for arthralgias.       Chronic back pain  Skin: Negative for color change, pallor, rash and wound.  Neurological: Negative for dizziness, seizures, syncope, facial asymmetry, weakness, light-headedness and headaches.     Physical Exam Updated Vital Signs BP 136/85 (BP Location: Right Arm)   Pulse 72   Temp 98.7 F (37.1 C) (Oral)   Resp 17   SpO2 95%   Physical Exam  Constitutional: He appears well-developed and well-nourished. No distress.  Afebrile, nontoxic-appearing, sitting comfortably in bed in no acute distress.  HENT:  Head: Normocephalic and atraumatic.  Eyes: Conjunctivae and EOM are normal.  Neck: Neck supple.  Cardiovascular: Normal rate, regular rhythm, normal heart sounds and intact distal pulses.   No murmur heard. Pulmonary/Chest: Effort normal and breath sounds normal. No respiratory distress. He has no wheezes. He has no rales.  Abdominal: He exhibits no distension.   Musculoskeletal: Normal range of motion. He exhibits edema. He exhibits no tenderness or deformity.  Bilateral 3+ pitting edema lower extremities.  Neurological: He is alert. No sensory deficit.  Skin: Skin is warm and dry. He is not diaphoretic. No erythema. No pallor.  No erythema, warmth or signs of cellulitis.  Psychiatric: He has a normal mood and affect.  Nursing note and vitals reviewed.    ED Treatments / Results  Labs (all labs ordered are listed, but only abnormal results are displayed) Labs Reviewed  BASIC METABOLIC PANEL - Abnormal; Notable for the following:       Result Value   Potassium 3.1 (*)    Chloride 99 (*)    Glucose, Bld 221 (*)    Calcium 8.6 (*)    All other components within normal limits    EKG  EKG Interpretation None       Radiology No results found.  Procedures Procedures (including critical care time)  Medications Ordered in ED Medications  oxyCODONE-acetaminophen (PERCOCET/ROXICET) 5-325 MG per tablet 2 tablet (2 tablets Oral Given 04/22/17 1717)     Initial Impression / Assessment and Plan / ED Course  I have reviewed the triage vital signs and the nursing notes.  Pertinent labs & imaging results that were available during my care of the patient were reviewed by me and considered in my medical decision making (see chart for details).    Patient presents with chronic bilateral lower extremity edema. He was seen in the emergency department 2 days ago for same was sent for DVT study outpatient yesterday which was negative for DVT. Patient has increased his Lasix dose to 120 mg.  Skin is without evidence of cellulitis. Patient is afebrile nontoxic-appearing. This has been a chronic issue for him and he reports no change over the last couple days he just wants helps bring down the swelling and requesting an aspiration.   States he had his knee aspirated with swelling in the past and thought maybe that would help. Explained to patient  that a joint aspiration is different from fluid pooling in his lower extremities due to venous stasis. Provided patient education on venous return physiology.  Urge patient to take his regular dose of Lasix and contact his PCP for medication adjustments. He has not  been wearing his compression stockings over the last couple days. Encouraged compression stocking, reduced salt intake, elevation and PCP follow up. Dc home.  Discussed with patient and family reasons to return to the emergency department including erythema, warmth, fever, chest pain, shortness of breath which was understood. Patient agrees with discharge plan. Patient was discussed with Dr. Laverta Baltimore who has seen patient and agrees with assessment and plan.  Final Clinical Impressions(s) / ED Diagnoses   Final diagnoses:  Bilateral lower extremity edema    New Prescriptions Discharge Medication List as of 04/22/2017  5:33 PM       Emeline General, PA-C 04/22/17 1737    Long, Wonda Old[facility], MD 04/22/17 [phone]

## 2017-05-05 ENCOUNTER — Encounter (HOSPITAL_COMMUNITY): Payer: Self-pay

## 2017-05-05 ENCOUNTER — Emergency Department (HOSPITAL_COMMUNITY): Payer: Medicaid Other

## 2017-05-05 ENCOUNTER — Emergency Department (HOSPITAL_COMMUNITY)
Admission: EM | Admit: 2017-05-05 | Discharge: 2017-05-05 | Disposition: A | Discharge status: Home / Self Care | Payer: Medicaid Other | Attending: Emergency Medicine | Admitting: Emergency Medicine

## 2017-05-05 DIAGNOSIS — I2510 Atherosclerotic heart disease of native coronary artery without angina pectoris: Secondary | ICD-10-CM | POA: Insufficient documentation

## 2017-05-05 DIAGNOSIS — I10 Essential (primary) hypertension: Secondary | ICD-10-CM | POA: Diagnosis not present

## 2017-05-05 DIAGNOSIS — Z7902 Long term (current) use of antithrombotics/antiplatelets: Secondary | ICD-10-CM | POA: Insufficient documentation

## 2017-05-05 DIAGNOSIS — Z7982 Long term (current) use of aspirin: Secondary | ICD-10-CM | POA: Diagnosis not present

## 2017-05-05 DIAGNOSIS — Z7984 Long term (current) use of oral hypoglycemic drugs: Secondary | ICD-10-CM | POA: Diagnosis not present

## 2017-05-05 DIAGNOSIS — G44209 Tension-type headache, unspecified, not intractable: Secondary | ICD-10-CM | POA: Insufficient documentation

## 2017-05-05 DIAGNOSIS — Z8673 Personal history of transient ischemic attack (TIA), and cerebral infarction without residual deficits: Secondary | ICD-10-CM | POA: Insufficient documentation

## 2017-05-05 DIAGNOSIS — Z79899 Other long term (current) drug therapy: Secondary | ICD-10-CM | POA: Diagnosis not present

## 2017-05-05 DIAGNOSIS — E119 Type 2 diabetes mellitus without complications: Secondary | ICD-10-CM | POA: Insufficient documentation

## 2017-05-05 DIAGNOSIS — R0602 Shortness of breath: Secondary | ICD-10-CM | POA: Insufficient documentation

## 2017-05-05 DIAGNOSIS — H538 Other visual disturbances: Secondary | ICD-10-CM

## 2017-05-05 LAB — CBC WITH DIFFERENTIAL/PLATELET
Basophils Absolute: 0 10*3/uL (ref 0.0–0.1)
Basophils Relative: 0 %
Eosinophils Absolute: 0.1 10*3/uL (ref 0.0–0.7)
Eosinophils Relative: 1 %
HCT: 38.6 % — ABNORMAL LOW (ref 39.0–52.0)
Hemoglobin: 12.3 g/dL — ABNORMAL LOW (ref 13.0–17.0)
Lymphocytes Relative: 28 %
Lymphs Abs: 3 10*3/uL (ref 0.7–4.0)
MCH: 28.1 pg (ref 26.0–34.0)
MCHC: 31.9 g/dL (ref 30.0–36.0)
MCV: 88.1 fL (ref 78.0–100.0)
Monocytes Absolute: 0.8 10*3/uL (ref 0.1–1.0)
Monocytes Relative: 8 %
Neutro Abs: 6.6 10*3/uL (ref 1.7–7.7)
Neutrophils Relative %: 63 %
Platelets: 265 10*3/uL (ref 150–400)
RBC: 4.38 MIL/uL (ref 4.22–5.81)
RDW: 14.4 % (ref 11.5–15.5)
WBC: 10.5 10*3/uL (ref 4.0–10.5)

## 2017-05-05 LAB — I-STAT CHEM 8, ED
BUN: 10 mg/dL (ref 6–20)
Calcium, Ion: 1.06 mmol/L — ABNORMAL LOW (ref 1.15–1.40)
Chloride: 99 mmol/L — ABNORMAL LOW (ref 101–111)
Creatinine, Ser: 0.5 mg/dL — ABNORMAL LOW (ref 0.61–1.24)
Glucose, Bld: 201 mg/dL — ABNORMAL HIGH (ref 65–99)
HCT: 39 % (ref 39.0–52.0)
Hemoglobin: 13.3 g/dL (ref 13.0–17.0)
Potassium: 3.1 mmol/L — ABNORMAL LOW (ref 3.5–5.1)
Sodium: 141 mmol/L (ref 135–145)
TCO2: 30 mmol/L (ref 0–100)

## 2017-05-05 LAB — I-STAT TROPONIN, ED: Troponin i, poc: 0.03 ng/mL (ref 0.00–0.08)

## 2017-05-05 LAB — BRAIN NATRIURETIC PEPTIDE: B Natriuretic Peptide: 11.5 pg/mL (ref 0.0–100.0)

## 2017-05-05 MED ORDER — ACETAMINOPHEN 325 MG PO TABS
650.0000 mg | ORAL_TABLET | Freq: Once | ORAL | Status: AC
  Administered 2017-05-05: 650 mg via ORAL
  Filled 2017-05-05: qty 2

## 2017-05-05 NOTE — Discharge Instructions (Signed)
Today your evaluated for headache, shortness of blood per revision neck and shoulder pain.  All other studies, which include looking at your heart with an EKG and specific cardiac enzymes.  CT scan of your head and chest x-ray all within normal limits.  I feel it is safe for you to return home and follow-up with your primary care physician.  Please continue taking medications as prescribed.  If you develop new symptoms or worsening symptoms please return for further evaluation

## 2017-05-05 NOTE — ED Provider Notes (Signed)
[facility] DEPT Provider Note   CSN: [id number] Arrival date & time: 05/05/17  1923     History   Chief Complaint Chief Complaint  Patient presents with  . Blurred Vision    HPI [name] is a 56 y.o. male.  Obese 56 year old male who presents with increased shortness of breath, left-sided headache, left shoulder, arm and chest discomfort, which he states is chronic and it comes and goes, it is stabbing in nature.  Has lower extremity edema, which is per patient.  Stable or slightly improved.  He states that his shortness of breath is slightly better since arrival in the emergency department headache is worse, and blurry vision has improved.      Past Medical History:  Diagnosis Date  . Arthritis   . Back pain   . CAD (coronary artery disease)    a. s/p DES to LAD in 05/2016  . DVT (deep venous thrombosis) ([place])   . Hyperlipidemia   . Hypertension   . IBS (irritable bowel syndrome)   . OSA (obstructive sleep apnea)   . Pancreatitis   . PE (pulmonary thromboembolism) ([place])   . PUD (peptic ulcer disease)   . Renal disorder   . Stroke ([place])   . Type 2 diabetes mellitus [facility])     Patient Active Problem List   Diagnosis Date Noted  . Chronic back pain 12/29/2016  . Depression 12/15/2016  . Vitamin D deficiency 12/09/2016  . Right hip pain 12/07/2016  . Hypokalemia 12/07/2016  . Type 2 diabetes mellitus with vascular disease ([place]) 05/31/2016  . Normocytic normochromic anemia 05/31/2016  . CAD (coronary artery disease) 01/06/2016  . DVT (deep venous thrombosis) ([place]) 01/06/2016  . Lactic acidosis 05/28/2014  . Nonspecific chest pain 01/29/2014  . Uncontrolled secondary diabetes with peripheral neuropathy ([place]) 01/29/2014  . Morbid obesity ([place]) 01/29/2014  . Snoring 01/29/2014  . Dyslipidemia 01/29/2014  . HTN (hypertension) 01/29/2014  . Abnormal nuclear stress test 01/29/2014    Past Surgical History:  Procedure Laterality Date  . CARDIAC  CATHETERIZATION N/A 05/31/2016   Procedure: Left Heart Cath and Coronary Angiography;  Surgeon: Peter M Martinique, MD;  Location: [place] [facility] CV LAB;  Service: Cardiovascular;  Laterality: N/A;  . CARDIAC CATHETERIZATION N/A 05/31/2016   Procedure: Intravascular Pressure Wire/FFR Study;  Surgeon: Peter M Martinique, MD;  Location: [place] CV LAB;  Service: Cardiovascular;  Laterality: N/A;  . CARDIAC CATHETERIZATION N/A 05/31/2016   Procedure: Coronary Stent Intervention;  Surgeon: Peter M Martinique, MD;  Location: [facility] CV LAB;  Service: Cardiovascular;  Laterality: N/A;  . LEFT HEART CATHETERIZATION WITH CORONARY ANGIOGRAM N/A 02/03/2014   Procedure: LEFT HEART CATHETERIZATION WITH CORONARY ANGIOGRAM;  Surgeon: Pixie Casino, MD;  Location: [facility] CATH LAB;  Service: Cardiovascular;  Laterality: N/A;  . left leg stent          Home Medications    Prior to Admission medications   Medication Sig Start Date End Date Taking? Authorizing Provider  amLODipine (NORVASC) 10 MG tablet Take 10 mg by mouth daily.   Yes [provider]  aspirin 81 MG chewable tablet Chew 1 tablet (81 mg total) by mouth daily. 06/01/16  Yes Elwin Mocha, MD  atorvastatin (LIPITOR) 80 MG tablet Take 1 tablet (80 mg total) by mouth daily. 01/26/17  Yes Strader, Dixon, PA-C  carvedilol (COREG) 25 MG tablet Take 1 tablet (25 mg total) by mouth 2 (two) times daily with a meal. 01/26/17  Yes Strader,  [place] M, PA-C  clopidogrel (PLAVIX) 75 MG tablet Take 75 mg by mouth daily.   Yes [provider]  famotidine (PEPCID) 20 MG tablet Take 20 mg by mouth 2 (two) times daily.   Yes [provider]  furosemide (LASIX) 40 MG tablet Take 1 tablet (40 mg total) by mouth 2 (two) times daily. 01/26/17  Yes Strader, [place] M, PA-C  glipiZIDE (GLUCOTROL) 10 MG tablet Take 10 mg by mouth 2 (two) times daily.  11/13/15  Yes [provider]  hydrALAZINE (APRESOLINE) 50 MG tablet Take 50 mg by mouth 2 (two)  times daily.   Yes [provider]  isosorbide dinitrate (ISORDIL) 30 MG tablet Take 1 tablet (30 mg total) by mouth 2 (two) times daily. 11/22/16  Yes Strader, [place] M, PA-C  LYRICA 75 MG capsule Take 75 mg by mouth 2 (two) times daily. 04/03/17  Yes [provider]  metFORMIN (GLUCOPHAGE-XR) 500 MG 24 hr tablet Take 1,000 mg by mouth 2 (two) times daily. 04/14/17  Yes [provider]  Oxycodone HCl 10 MG TABS Take 10 mg by mouth 3 (three) times daily as needed (for pain).  04/18/17  Yes [provider]  pantoprazole (PROTONIX) 20 MG tablet Take 1 tablet (20 mg total) by mouth 2 (two) times daily. 01/22/17  Yes Gareth Morgan, MD  potassium chloride SA (K-DUR,KLOR-CON) 20 MEQ tablet Take 20 mEq by mouth daily.   Yes [provider]  sertraline (ZOLOFT) 50 MG tablet Take 50 mg by mouth daily.   Yes [provider]  sitaGLIPtin (JANUVIA) 100 MG tablet Take 100 mg by mouth daily.   Yes [provider]  tiZANidine (ZANAFLEX) 2 MG tablet Take 2 mg by mouth every 6 (six) hours as needed for muscle spasms.   Yes [provider]  traZODone (DESYREL) 50 MG tablet Take 1 tablet (50 mg total) by mouth at bedtime. 12/15/16  Yes Burns, Arloa Koh, MD  canagliflozin (INVOKANA) 100 MG TABS tablet Take 1 tablet (100 mg total) by mouth daily before breakfast. Patient not taking: Reported on 04/20/2017 12/01/16   Ledell Noss, MD  nitroGLYCERIN (NITROSTAT) 0.4 MG SL tablet Place 0.4 mg under the tongue every 5 (five) minutes as needed for chest pain. 12/17/16   [provider]  timolol (TIMOPTIC) 0.5 % ophthalmic solution Place 1 drop into both eyes 2 (two) times daily. Patient not taking: Reported on 04/20/2017 03/27/17   Recardo Evangelist, PA-C    Family History Family History  Problem Relation Age of Onset  . Cancer Father   . Hypertension Mother   . Diabetes Mother   . Breast cancer Mother   . Hypertension Brother   . Diabetes Brother     . Hypertension Sister   . Diabetes Sister     Social History Social History  Substance Use Topics  . Smoking status: Never Smoker  . Smokeless tobacco: Never Used  . Alcohol use No     Allergies   Coconut flavor [flavoring agent]; Coconut oil; and Ibuprofen   Review of Systems Review of Systems  Constitutional: Negative for fatigue and fever.  Respiratory: Positive for shortness of breath.   Cardiovascular: Positive for leg swelling. Negative for chest pain.  Gastrointestinal: Negative for nausea.  Musculoskeletal: Positive for neck pain.  Neurological: Positive for headaches.  All other systems reviewed and are negative.    Physical Exam Updated Vital Signs BP (!) 146/61 (BP Location: Left Arm)   Pulse 79  Temp 99.1 F (37.3 C) (Oral)   Resp 18   SpO2 95%   Physical Exam  Constitutional: He is oriented to person, place, and time. He appears well-developed and well-nourished.  HENT:  Head: Normocephalic.  Eyes: Pupils are equal, round, and reactive to light.  Neck: Normal range of motion.  Cardiovascular: Normal rate.   Pulmonary/Chest: Effort normal.  Abdominal: Soft.  Musculoskeletal: He exhibits edema. He exhibits no tenderness.  Neurological: He is alert and oriented to person, place, and time.  Skin: Skin is warm and dry. No rash noted.  Psychiatric: He has a normal mood and affect.  Nursing note and vitals reviewed.    ED Treatments / Results  Labs (all labs ordered are listed, but only abnormal results are displayed) Labs Reviewed  CBC WITH DIFFERENTIAL/PLATELET - Abnormal; Notable for the following:       Result Value   Hemoglobin 12.3 (*)    HCT 38.6 (*)    All other components within normal limits  I-STAT CHEM 8, ED - Abnormal; Notable for the following:    Potassium 3.1 (*)    Chloride 99 (*)    Creatinine, Ser 0.50 (*)    Glucose, Bld 201 (*)    Calcium, Ion 1.06 (*)    All other components within normal limits  BRAIN NATRIURETIC  PEPTIDE  I-STAT TROPOININ, ED    EKG  EKG Interpretation None       Radiology Dg Chest 2 View  Result Date: 05/05/2017 CLINICAL DATA:  Shortness of breath. EXAM: CHEST  2 VIEW COMPARISON:  Radiograph 04/20/2017.  CT 06/27/2016 FINDINGS: The cardiomediastinal contours are unchanged, heart size upper normal. Scarring at the left lung base. Pulmonary vasculature is normal. No consolidation, pleural effusion, or pneumothorax. No acute osseous abnormalities are seen. Degenerative change in the spine. IMPRESSION: No acute abnormality or change from prior exam. Electronically Signed   By: Jeb Levering M.D.   On: 05/05/2017 20:36   Ct Head Wo Contrast  Result Date: 05/05/2017 CLINICAL DATA:  Headache. Left upper extremity pain. History of CVA. EXAM: CT HEAD WITHOUT CONTRAST TECHNIQUE: Contiguous axial images were obtained from the base of the skull through the vertex without intravenous contrast. COMPARISON:  03/27/2017 head CT. FINDINGS: Brain: No evidence of parenchymal hemorrhage or extra-axial fluid collection. No mass lesion, mass effect, or midline shift. No CT evidence of acute infarction. Nonspecific mild subcortical and periventricular white matter hypodensity, most in keeping with chronic small vessel ischemic change. Cerebral volume is age appropriate. No ventriculomegaly. Vascular: No acute abnormality. Skull: No evidence of calvarial fracture. Sinuses/Orbits: The visualized paranasal sinuses are essentially clear. Other:  The mastoid air cells are unopacified. IMPRESSION: 1.  No evidence of acute intracranial abnormality. 2. Mild chronic small vessel ischemic change. Electronically Signed   By: Ilona Sorrel M.D.   On: 05/05/2017 21:12    Procedures Procedures (including critical care time)  Medications Ordered in ED Medications  acetaminophen (TYLENOL) tablet 650 mg (650 mg Oral Given 05/05/17 2049)     Initial Impression / Assessment and Plan / ED Course  I have reviewed the  triage vital signs and the nursing notes.  Pertinent labs & imaging results that were available during my care of the patient were reviewed by me and considered in my medical decision making (see chart for details).      Patient not does not appear to be in any distress.  Will obtain CBC i-STAT i-STAT troponin.  EKG, chest x-ray, head CT,  and we will reevaluate.  All studies and labs within normal parameters.  BNP is 11.  EKG is unchanged.  Headache, resolved with the use of Tylenol.  This was all discussed with the patient who is in agreement to go home and follow-up with his primary care physician.  He has been given.  Return parameters Final Clinical Impressions(s) / ED Diagnoses   Final diagnoses:  Blurred vision  Tension-type headache, not intractable, unspecified chronicity pattern  Shortness of breath    New Prescriptions New Prescriptions   No medications on file     Junius Creamer, NP 05/05/17 2224    Veryl Speak, MD 05/05/17 [phone]

## 2017-05-05 NOTE — ED Triage Notes (Signed)
PER EMS: pt from home with c/o blurred vision and SOB that started at 6pm, both of which have subsided since he called EMS. He states he always has blurred vision (does not have his glasses with him) and states this blurred vision he has now is his normal blurred vision. Pt also reports posterior HA that radiates down his left arm and to his left flank area. 12 lead unremarkable with EMS. BP-136/80, CBG-216, 94% RA.

## 2017-05-29 ENCOUNTER — Encounter (HOSPITAL_COMMUNITY): Payer: Self-pay

## 2017-05-29 ENCOUNTER — Emergency Department (HOSPITAL_COMMUNITY)
Admission: EM | Admit: 2017-05-29 | Discharge: 2017-05-29 | Discharge status: Against Medical Advice | Payer: Medicaid Other | Attending: Emergency Medicine | Admitting: Emergency Medicine

## 2017-05-29 ENCOUNTER — Other Ambulatory Visit: Payer: Self-pay

## 2017-05-29 DIAGNOSIS — R42 Dizziness and giddiness: Secondary | ICD-10-CM | POA: Insufficient documentation

## 2017-05-29 DIAGNOSIS — Z5321 Procedure and treatment not carried out due to patient leaving prior to being seen by health care provider: Secondary | ICD-10-CM | POA: Insufficient documentation

## 2017-05-29 LAB — CBG MONITORING, ED: Glucose-Capillary: 287 mg/dL — ABNORMAL HIGH (ref 65–99)

## 2017-05-29 LAB — CBC
HCT: 37.5 % — ABNORMAL LOW (ref 39.0–52.0)
Hemoglobin: 11.9 g/dL — ABNORMAL LOW (ref 13.0–17.0)
MCH: 27.9 pg (ref 26.0–34.0)
MCHC: 31.7 g/dL (ref 30.0–36.0)
MCV: 87.8 fL (ref 78.0–100.0)
Platelets: 241 10*3/uL (ref 150–400)
RBC: 4.27 MIL/uL (ref 4.22–5.81)
RDW: 14.4 % (ref 11.5–15.5)
WBC: 13.1 10*3/uL — ABNORMAL HIGH (ref 4.0–10.5)

## 2017-05-29 LAB — BASIC METABOLIC PANEL
Anion gap: 12 (ref 5–15)
BUN: 15 mg/dL (ref 6–20)
CO2: 25 mmol/L (ref 22–32)
Calcium: 9.1 mg/dL (ref 8.9–10.3)
Chloride: 96 mmol/L — ABNORMAL LOW (ref 101–111)
Creatinine, Ser: 0.77 mg/dL (ref 0.61–1.24)
GFR calc Af Amer: >60 mL/min (ref >60)
GFR calc non Af Amer: >60 mL/min (ref >60)
Glucose, Bld: 283 mg/dL — ABNORMAL HIGH (ref 65–99)
Potassium: 3.7 mmol/L (ref 3.5–5.1)
Sodium: 133 mmol/L — ABNORMAL LOW (ref 135–145)

## 2017-05-29 NOTE — ED Notes (Signed)
Writer called for room assignment, no response 

## 2017-05-29 NOTE — ED Triage Notes (Signed)
Pt presents for evaluation of dizziness since yesterday. Denies injury to head or falls. Patient reports elevated CBG. Pt denies pain to chest or sob.

## 2017-05-29 NOTE — ED Triage Notes (Signed)
Patient called for room again, no answer x 3. Moved OTF

## 2017-06-08 ENCOUNTER — Encounter (HOSPITAL_COMMUNITY): Payer: Self-pay | Admitting: Emergency Medicine

## 2017-06-08 DIAGNOSIS — I119 Hypertensive heart disease without heart failure: Secondary | ICD-10-CM | POA: Diagnosis not present

## 2017-06-08 DIAGNOSIS — Z7982 Long term (current) use of aspirin: Secondary | ICD-10-CM | POA: Insufficient documentation

## 2017-06-08 DIAGNOSIS — R112 Nausea with vomiting, unspecified: Secondary | ICD-10-CM | POA: Diagnosis not present

## 2017-06-08 DIAGNOSIS — K226 Gastro-esophageal laceration-hemorrhage syndrome: Secondary | ICD-10-CM | POA: Diagnosis not present

## 2017-06-08 DIAGNOSIS — Z79899 Other long term (current) drug therapy: Secondary | ICD-10-CM | POA: Diagnosis not present

## 2017-06-08 DIAGNOSIS — Z8673 Personal history of transient ischemic attack (TIA), and cerebral infarction without residual deficits: Secondary | ICD-10-CM | POA: Diagnosis not present

## 2017-06-08 DIAGNOSIS — R040 Epistaxis: Secondary | ICD-10-CM | POA: Insufficient documentation

## 2017-06-08 DIAGNOSIS — I2510 Atherosclerotic heart disease of native coronary artery without angina pectoris: Secondary | ICD-10-CM | POA: Diagnosis not present

## 2017-06-08 DIAGNOSIS — E119 Type 2 diabetes mellitus without complications: Secondary | ICD-10-CM | POA: Diagnosis not present

## 2017-06-08 DIAGNOSIS — Z7984 Long term (current) use of oral hypoglycemic drugs: Secondary | ICD-10-CM | POA: Diagnosis not present

## 2017-06-08 DIAGNOSIS — R1110 Vomiting, unspecified: Secondary | ICD-10-CM | POA: Diagnosis present

## 2017-06-08 LAB — COMPREHENSIVE METABOLIC PANEL
ALT: 21 U/L (ref 17–63)
AST: 30 U/L (ref 15–41)
Albumin: 3.8 g/dL (ref 3.5–5.0)
Alkaline Phosphatase: 44 U/L (ref 38–126)
Anion gap: 14 (ref 5–15)
BUN: 11 mg/dL (ref 6–20)
CO2: 23 mmol/L (ref 22–32)
Calcium: 9 mg/dL (ref 8.9–10.3)
Chloride: 99 mmol/L — ABNORMAL LOW (ref 101–111)
Creatinine, Ser: 0.83 mg/dL (ref 0.61–1.24)
GFR calc Af Amer: >60 mL/min (ref >60)
GFR calc non Af Amer: >60 mL/min (ref >60)
Glucose, Bld: 277 mg/dL — ABNORMAL HIGH (ref 65–99)
Potassium: 3.5 mmol/L (ref 3.5–5.1)
Sodium: 136 mmol/L (ref 135–145)
Total Bilirubin: 0.8 mg/dL (ref 0.3–1.2)
Total Protein: 6.8 g/dL (ref 6.5–8.1)

## 2017-06-08 LAB — LIPASE, BLOOD: Lipase: 34 U/L (ref 11–51)

## 2017-06-08 LAB — URINALYSIS, ROUTINE W REFLEX MICROSCOPIC
Bilirubin Urine: NEGATIVE
Glucose, UA: >=500 mg/dL — AB
Hgb urine dipstick: NEGATIVE
Ketones, ur: 5 mg/dL — AB
Leukocytes, UA: NEGATIVE
Nitrite: NEGATIVE
Protein, ur: NEGATIVE mg/dL
Specific Gravity, Urine: 1.023 (ref 1.005–1.030)
pH: 5 (ref 5.0–8.0)

## 2017-06-08 LAB — CBC
HCT: 36.8 % — ABNORMAL LOW (ref 39.0–52.0)
Hemoglobin: 11.8 g/dL — ABNORMAL LOW (ref 13.0–17.0)
MCH: 28.6 pg (ref 26.0–34.0)
MCHC: 32.1 g/dL (ref 30.0–36.0)
MCV: 89.1 fL (ref 78.0–100.0)
Platelets: 248 10*3/uL (ref 150–400)
RBC: 4.13 MIL/uL — ABNORMAL LOW (ref 4.22–5.81)
RDW: 14.8 % (ref 11.5–15.5)
WBC: 10.5 10*3/uL (ref 4.0–10.5)

## 2017-06-08 MED ORDER — ONDANSETRON 4 MG PO TBDP
ORAL_TABLET | ORAL | Status: AC
  Filled 2017-06-08: qty 1

## 2017-06-08 MED ORDER — ONDANSETRON 4 MG PO TBDP
4.0000 mg | ORAL_TABLET | Freq: Once | ORAL | Status: AC
  Administered 2017-06-08: 4 mg via ORAL

## 2017-06-08 MED ORDER — FENTANYL CITRATE (PF) 100 MCG/2ML IJ SOLN
50.0000 ug | INTRAMUSCULAR | Status: DC | PRN
Start: 2017-06-08 — End: 2017-06-09
  Administered 2017-06-08: 50 ug via NASAL

## 2017-06-08 MED ORDER — FENTANYL CITRATE (PF) 100 MCG/2ML IJ SOLN
INTRAMUSCULAR | Status: AC
  Filled 2017-06-08: qty 2

## 2017-06-08 NOTE — ED Notes (Signed)
Attempted phelbotomy x1 in L hand, no success. Second staff member to attempt.

## 2017-06-08 NOTE — ED Triage Notes (Signed)
Pt reports emesis and abdominal pain after eating. States emesis x3 in one hour. States "I feel like my stomach on fire." Reports dizziness x1year.

## 2017-06-09 ENCOUNTER — Emergency Department (HOSPITAL_COMMUNITY)
Admission: EM | Admit: 2017-06-09 | Discharge: 2017-06-09 | Disposition: A | Discharge status: Home / Self Care | Payer: Medicaid Other | Attending: Emergency Medicine | Admitting: Emergency Medicine

## 2017-06-09 ENCOUNTER — Other Ambulatory Visit: Payer: Self-pay | Admitting: Orthopedic Surgery

## 2017-06-09 DIAGNOSIS — K226 Gastro-esophageal laceration-hemorrhage syndrome: Secondary | ICD-10-CM

## 2017-06-09 DIAGNOSIS — R040 Epistaxis: Secondary | ICD-10-CM

## 2017-06-09 DIAGNOSIS — M50122 Cervical disc disorder at C5-C6 level with radiculopathy: Secondary | ICD-10-CM

## 2017-06-09 DIAGNOSIS — R112 Nausea with vomiting, unspecified: Secondary | ICD-10-CM

## 2017-06-09 LAB — CBC
HCT: 35.3 % — ABNORMAL LOW (ref 39.0–52.0)
Hemoglobin: 11.6 g/dL — ABNORMAL LOW (ref 13.0–17.0)
MCH: 28.7 pg (ref 26.0–34.0)
MCHC: 32.9 g/dL (ref 30.0–36.0)
MCV: 87.4 fL (ref 78.0–100.0)
Platelets: 219 10*3/uL (ref 150–400)
RBC: 4.04 MIL/uL — ABNORMAL LOW (ref 4.22–5.81)
RDW: 14.4 % (ref 11.5–15.5)
WBC: 10 10*3/uL (ref 4.0–10.5)

## 2017-06-09 MED ORDER — HYDROCODONE-ACETAMINOPHEN 5-325 MG PO TABS
1.0000 | ORAL_TABLET | Freq: Once | ORAL | Status: AC
  Administered 2017-06-09: 1 via ORAL
  Filled 2017-06-09: qty 1

## 2017-06-09 MED ORDER — SUCRALFATE 1 G PO TABS
1.0000 g | ORAL_TABLET | Freq: Once | ORAL | Status: AC
  Administered 2017-06-09: 1 g via ORAL
  Filled 2017-06-09: qty 1

## 2017-06-09 MED ORDER — FAMOTIDINE 20 MG PO TABS: 20.0000 mg | ORAL_TABLET | Freq: Two times a day (BID) | ORAL | 0 refills | Status: DC

## 2017-06-09 MED ORDER — ONDANSETRON 4 MG PO TBDP: 4.0000 mg | ORAL_TABLET | Freq: Three times a day (TID) | ORAL | 0 refills | Status: DC | PRN

## 2017-06-09 MED ORDER — PANTOPRAZOLE SODIUM 40 MG IV SOLR: 40.0000 mg | Freq: Once | INTRAVENOUS | Status: DC

## 2017-06-09 MED ORDER — ONDANSETRON 4 MG PO TBDP
4.0000 mg | ORAL_TABLET | Freq: Once | ORAL | Status: AC
  Administered 2017-06-09: 4 mg via ORAL
  Filled 2017-06-09: qty 1

## 2017-06-09 NOTE — ED Provider Notes (Signed)
[facility] DEPT Provider Note   CSN: [id number] Arrival date & time: 06/08/17  1919  By signing my name below, I, Margit Banda, attest that this documentation has been prepared under the direction and in the presence of Varney Biles, MD. Electronically Signed: Margit Banda, ED Scribe. 06/09/17. 1:29 AM.  History   Chief Complaint Chief Complaint  Patient presents with  . Emesis    HPI [name] is a 56 y.o. male with a PMHx of CAD, IBS, OSA, PE, PUD, renal disorder and stroke, who presents to the Emergency Department complaining of sudden emesis (3x) that started after eating ~ 6:30 pm on 06/08/17. Associated sx include nausea, sharp abdominal pain, nosebleeds, hematemesis (between BRB and DRB - small volume), dizziness, HA and CP. Pt reports every three months he will experience hematemesis and/or blood in stool. Notes it will get better on its own, however, this time he had a nosebleed, which he hasn't had before. Hasn't vomited since coming to the ED. Takes Plavix. Last BM was on 06/08/17. Pt denies fever, blood in stool, SOB, or any other complaints at this time.   The history is provided by the patient. No language interpreter was used.    Past Medical History:  Diagnosis Date  . Arthritis   . Back pain   . CAD (coronary artery disease)    a. s/p DES to LAD in 05/2016  . DVT (deep venous thrombosis) ([place])   . Hyperlipidemia   . Hypertension   . IBS (irritable bowel syndrome)   . OSA (obstructive sleep apnea)   . Pancreatitis   . PE (pulmonary thromboembolism) ([place])   . PUD (peptic ulcer disease)   . Renal disorder   . Stroke ([place])   . Type 2 diabetes mellitus [facility])     Patient Active Problem List   Diagnosis Date Noted  . Chronic back pain 12/29/2016  . Depression 12/15/2016  . Vitamin D deficiency 12/09/2016  . Right hip pain 12/07/2016  . Hypokalemia 12/07/2016  . Type 2 diabetes mellitus with vascular disease ([place]) 05/31/2016  . Normocytic  normochromic anemia 05/31/2016  . CAD (coronary artery disease) 01/06/2016  . DVT (deep venous thrombosis) ([place]) 01/06/2016  . Lactic acidosis 05/28/2014  . Nonspecific chest pain 01/29/2014  . Uncontrolled secondary diabetes with peripheral neuropathy ([place]) 01/29/2014  . Morbid obesity ([place]) 01/29/2014  . Snoring 01/29/2014  . Dyslipidemia 01/29/2014  . HTN (hypertension) 01/29/2014  . Abnormal nuclear stress test 01/29/2014    Past Surgical History:  Procedure Laterality Date  . CARDIAC CATHETERIZATION N/A 05/31/2016   Procedure: Left Heart Cath and Coronary Angiography;  Surgeon: Peter M Martinique, MD;  Location: [facility];  Service: Cardiovascular;  Laterality: N/A;  . CARDIAC CATHETERIZATION N/A 05/31/2016   Procedure: Intravascular Pressure Wire/FFR Study;  Surgeon: Peter M Martinique, MD;  Location: [facility];  Service: Cardiovascular;  Laterality: N/A;  . CARDIAC CATHETERIZATION N/A 05/31/2016   Procedure: Coronary Stent Intervention;  Surgeon: Peter M Martinique, MD;  Location: [place] [facility];  Service: Cardiovascular;  Laterality: N/A;  . LEFT HEART CATHETERIZATION WITH CORONARY ANGIOGRAM N/A 02/03/2014   Procedure: LEFT HEART CATHETERIZATION WITH CORONARY ANGIOGRAM;  Surgeon: Pixie Casino, MD;  Location: [facility] CATH LAB;  Service: Cardiovascular;  Laterality: N/A;  . left leg stent          Home Medications    Prior to Admission medications   Medication Sig Start Date End Date Taking? Authorizing Provider  amLODipine (  NORVASC) 10 MG tablet Take 10 mg by mouth daily.   Yes [provider]  aspirin 81 MG chewable tablet Chew 1 tablet (81 mg total) by mouth daily. 06/01/16  Yes Elwin Mocha, MD  atorvastatin (LIPITOR) 80 MG tablet Take 1 tablet (80 mg total) by mouth daily. 01/26/17  Yes Strader, Fransisco Hertz, PA-C  carvedilol (COREG) 25 MG tablet Take 1 tablet (25 mg total) by mouth 2 (two) times daily with a meal. 01/26/17  Yes Strader, [place] M, PA-C    clopidogrel (PLAVIX) 75 MG tablet Take 75 mg by mouth daily.   Yes [provider]  furosemide (LASIX) 40 MG tablet Take 1 tablet (40 mg total) by mouth 2 (two) times daily. 01/26/17  Yes Strader, [place] M, PA-C  glipiZIDE (GLUCOTROL) 10 MG tablet Take 10 mg by mouth 2 (two) times daily.  11/13/15  Yes [provider]  hydrALAZINE (APRESOLINE) 50 MG tablet Take 50 mg by mouth 2 (two) times daily.   Yes [provider]  isosorbide dinitrate (ISORDIL) 30 MG tablet Take 1 tablet (30 mg total) by mouth 2 (two) times daily. 11/22/16  Yes Strader, [place] M, PA-C  LYRICA 75 MG capsule Take 75 mg by mouth 2 (two) times daily. 04/03/17  Yes [provider]  metFORMIN (GLUCOPHAGE-XR) 500 MG 24 hr tablet Take 1,000 mg by mouth 2 (two) times daily. 04/14/17  Yes [provider]  nitroGLYCERIN (NITROSTAT) 0.4 MG SL tablet Place 0.4 mg under the tongue every 5 (five) minutes as needed for chest pain. 12/17/16  Yes [provider]  Oxycodone HCl 10 MG TABS Take 10 mg by mouth 3 (three) times daily as needed (for pain).  04/18/17  Yes [provider]  pantoprazole (PROTONIX) 20 MG tablet Take 1 tablet (20 mg total) by mouth 2 (two) times daily. 01/22/17  Yes Gareth Morgan, MD  potassium chloride SA (K-DUR,KLOR-CON) 20 MEQ tablet Take 20 mEq by mouth daily.   Yes [provider]  sertraline (ZOLOFT) 50 MG tablet Take 50 mg by mouth daily.   Yes [provider]  sitaGLIPtin (JANUVIA) 100 MG tablet Take 100 mg by mouth daily.   Yes [provider]  tiZANidine (ZANAFLEX) 2 MG tablet Take 2 mg by mouth every 6 (six) hours as needed for muscle spasms.   Yes [provider]  traZODone (DESYREL) 50 MG tablet Take 1 tablet (50 mg total) by mouth at bedtime. 12/15/16  Yes Burns, Arloa Koh, MD  famotidine (PEPCID) 20 MG tablet Take 1 tablet (20 mg total) by mouth 2 (two) times daily. 06/09/17   Varney Biles, MD  ondansetron (ZOFRAN  ODT) 4 MG disintegrating tablet Take 1 tablet (4 mg total) by mouth every 8 (eight) hours as needed for nausea or vomiting. 06/09/17   Varney Biles, MD    Family History Family History  Problem Relation Age of Onset  . Cancer Father   . Hypertension Mother   . Diabetes Mother   . Breast cancer Mother   . Hypertension Brother   . Diabetes Brother   . Hypertension Sister   . Diabetes Sister     Social History Social History  Substance Use Topics  . Smoking status: Never Smoker  . Smokeless tobacco: Never Used  . Alcohol use No     Allergies   Coconut flavor [flavoring agent]; Coconut oil; and Ibuprofen   Review of Systems Review of Systems  Constitutional: Negative for fever.  HENT: Positive for nosebleeds.  Respiratory: Negative for shortness of breath.   Cardiovascular: Positive for chest pain.  Gastrointestinal: Positive for abdominal pain, nausea and vomiting. Negative for blood in stool.  Neurological: Positive for dizziness and headaches.  All other systems reviewed and are negative.    Physical Exam Updated Vital Signs BP 128/76   Pulse 69   Temp 98.7 F (37.1 C) (Oral)   Resp 18   Ht 5\' 10"  (1.778 m)   Wt (!) 140.6 kg (310 lb)   SpO2 92%   BMI 44.48 kg/m   Physical Exam  Constitutional: He is oriented to person, place, and time. He appears well-developed and well-nourished.  HENT:  Head: Normocephalic.  No active scabbing, or bleeding appreciated in bilateral nares. No bleeding the oropharynx.   Eyes: EOM are normal.  Neck: Normal range of motion.  Pulmonary/Chest: Effort normal.  Abdominal: He exhibits no distension. There is generalized tenderness and tenderness in the right upper quadrant.  Musculoskeletal: Normal range of motion.  Neurological: He is alert and oriented to person, place, and time.  Psychiatric: He has a normal mood and affect.  Nursing note and vitals reviewed.    ED Treatments / Results  DIAGNOSTIC STUDIES: Oxygen  Saturation is 95% on RA, adequate by my interpretation.   COORDINATION OF CARE: 1:29 AM-Discussed next steps with pt. Pt verbalized understanding and is agreeable with the plan.   Labs (all labs ordered are listed, but only abnormal results are displayed) Labs Reviewed  COMPREHENSIVE METABOLIC PANEL - Abnormal; Notable for the following:       Result Value   Chloride 99 (*)    Glucose, Bld 277 (*)    All other components within normal limits  CBC - Abnormal; Notable for the following:    RBC 4.13 (*)    Hemoglobin 11.8 (*)    HCT 36.8 (*)    All other components within normal limits  URINALYSIS, ROUTINE W REFLEX MICROSCOPIC - Abnormal; Notable for the following:    Glucose, UA >=500 (*)    Ketones, ur 5 (*)    Bacteria, UA RARE (*)    Squamous Epithelial / LPF 0-5 (*)    All other components within normal limits  CBC - Abnormal; Notable for the following:    RBC 4.04 (*)    Hemoglobin 11.6 (*)    HCT 35.3 (*)    All other components within normal limits  LIPASE, BLOOD    EKG  EKG Interpretation None       Radiology No results found.  Procedures Procedures (including critical care time)  Medications Ordered in ED Medications  ondansetron (ZOFRAN-ODT) disintegrating tablet 4 mg (4 mg Oral Given 06/08/17 2044)  ondansetron (ZOFRAN-ODT) disintegrating tablet 4 mg (4 mg Oral Given 06/09/17 0208)  sucralfate (CARAFATE) tablet 1 g (1 g Oral Given 06/09/17 0207)  HYDROcodone-acetaminophen (NORCO/VICODIN) 5-325 MG per tablet 1 tablet (1 tablet Oral Given 06/09/17 0207)     Initial Impression / Assessment and Plan / ED Course  I have reviewed the triage vital signs and the nursing notes.  Pertinent labs & imaging results that were available during my care of the patient were reviewed by me and considered in my medical decision making (see chart for details).  Clinical Course as of Jun 13 1811  Fri Jun 09, 2017  0123 Findings from 06/2016 EGD for hematemesis and bloody  stools  1. Esophagus-. The exam of the esophagus was normal. The Z line was crisp, regular and corresponded to the  GE junction confirmed by the most proximal extent of gastric folds at approximately 44 cm from the incisors. The GE junction appeared to be normally patent. There was no evidence of any ring, stricture, web or narrowing in the esophagus. There was no endoscopic evidence of eosinophilic esophagitis.  2. Stomach-. The exam of the stomach included a retroflexed view of the angularis, cardia and fundus. In the fundus of the stomach there was a 5 mm sessile polyp. The exam of the stomach was otherwise normal. Gastric fold size, thickness and distensibility was normal.  3. Pylorus and duodenum-. The exam of the pylorus and duodenum was normal.  The upper digestive tract was deflated and the scope was removed.  The patient tolerated the procedure well.      [AN]  0331 Pt reassessed, he has passed oral challenge. Pt denies any emesis in the ER and the nose bleedings have stopped. Repeat hemoglobin is unchanged. Pt declined rectal exam.  [JG]  0335 Results from the ER workup discussed with the patient face to face and all questions answered to the best of my ability.  Please return to the ER if your symptoms worsen; you have increased pain, fevers, chills, inability to keep any medications down, confusion. Otherwise see the outpatient doctor as requested.   [AN]    Clinical Course User Index [AN] Varney Biles, MD [JG] Margit Banda   Pt comes to the ER with cc of GI bleed and nasal bleed. Nose bleeds have stopped spontaneously. Pt has hx of GI bleed. He gets care at OSH, and chart reviews show EGD last year that showed no significant abnormality - specifically there were no bleeding ulcers or severe gastritis/esophagitis and no varices. Pt reports similar episodes that come and go. He has non peritoneal abd exam. Initial CBC is showing Hb of 11.8. We dont have baseline in our  system. BUN is normal - thus making UGIB less likely. Pt hasn't had emesis for the 3.5 hours he has been in the Er, and he hasnt had any bloody stools either. Plan is to watch the patient even longer in the ER, and if he continues to have no bleeding, we will d/c with close return precautions and a close GI f/u recommendations.  Final Clinical Impressions(s) / ED Diagnoses   Final diagnoses:  Mallory-Weiss tear  Bleeding from the nose  Non-intractable vomiting with nausea, unspecified vomiting type    New Prescriptions Discharge Medication List as of 06/09/2017  3:38 AM    START taking these medications   Details  ondansetron (ZOFRAN ODT) 4 MG disintegrating tablet Take 1 tablet (4 mg total) by mouth every 8 (eight) hours as needed for nausea or vomiting., Starting Fri 06/09/2017, Print      I personally performed the services described in this documentation, which was scribed in my presence. The recorded information has been reviewed and is accurate.    Varney Biles, MD 06/12/17 [phone]

## 2017-06-09 NOTE — Discharge Instructions (Signed)
We saw you in the ER for the bleeding - which has stopped. All the results in the ER are normal, labs and imaging.  The workup in the ER is not complete, and is limited to screening for life threatening and emergent conditions only, so please see your GI doctor as soon as possible.  PLEASE RETURN TO THE ER IF THE BLEEDING GETS WORSE, YOU GET SHORT OF BREATH, HAVE CHEST PAINS OR GET DIZZY.

## 2017-06-16 ENCOUNTER — Encounter: Payer: Medicaid Other | Admitting: Internal Medicine

## 2017-06-17 ENCOUNTER — Emergency Department (HOSPITAL_COMMUNITY): Payer: Medicaid Other

## 2017-06-17 ENCOUNTER — Observation Stay (HOSPITAL_COMMUNITY)
Admission: EM | Admit: 2017-06-17 | Discharge: 2017-06-18 | Disposition: A | Discharge status: Home / Self Care | Payer: Medicaid Other | Attending: Internal Medicine | Admitting: Internal Medicine

## 2017-06-17 ENCOUNTER — Observation Stay (HOSPITAL_COMMUNITY): Payer: Medicaid Other

## 2017-06-17 DIAGNOSIS — Z7982 Long term (current) use of aspirin: Secondary | ICD-10-CM | POA: Insufficient documentation

## 2017-06-17 DIAGNOSIS — E1165 Type 2 diabetes mellitus with hyperglycemia: Secondary | ICD-10-CM | POA: Diagnosis not present

## 2017-06-17 DIAGNOSIS — D649 Anemia, unspecified: Secondary | ICD-10-CM | POA: Diagnosis not present

## 2017-06-17 DIAGNOSIS — R471 Dysarthria and anarthria: Secondary | ICD-10-CM | POA: Diagnosis not present

## 2017-06-17 DIAGNOSIS — F329 Major depressive disorder, single episode, unspecified: Secondary | ICD-10-CM | POA: Insufficient documentation

## 2017-06-17 DIAGNOSIS — E1151 Type 2 diabetes mellitus with diabetic peripheral angiopathy without gangrene: Secondary | ICD-10-CM | POA: Diagnosis not present

## 2017-06-17 DIAGNOSIS — Z86718 Personal history of other venous thrombosis and embolism: Secondary | ICD-10-CM | POA: Insufficient documentation

## 2017-06-17 DIAGNOSIS — I672 Cerebral atherosclerosis: Secondary | ICD-10-CM | POA: Diagnosis not present

## 2017-06-17 DIAGNOSIS — E876 Hypokalemia: Secondary | ICD-10-CM | POA: Diagnosis not present

## 2017-06-17 DIAGNOSIS — H538 Other visual disturbances: Secondary | ICD-10-CM | POA: Insufficient documentation

## 2017-06-17 DIAGNOSIS — G4733 Obstructive sleep apnea (adult) (pediatric): Secondary | ICD-10-CM | POA: Insufficient documentation

## 2017-06-17 DIAGNOSIS — G8929 Other chronic pain: Secondary | ICD-10-CM | POA: Insufficient documentation

## 2017-06-17 DIAGNOSIS — I6340 Cerebral infarction due to embolism of unspecified cerebral artery: Secondary | ICD-10-CM | POA: Diagnosis present

## 2017-06-17 DIAGNOSIS — E782 Mixed hyperlipidemia: Secondary | ICD-10-CM | POA: Diagnosis not present

## 2017-06-17 DIAGNOSIS — I10 Essential (primary) hypertension: Secondary | ICD-10-CM | POA: Diagnosis not present

## 2017-06-17 DIAGNOSIS — G459 Transient cerebral ischemic attack, unspecified: Secondary | ICD-10-CM | POA: Diagnosis present

## 2017-06-17 DIAGNOSIS — E1159 Type 2 diabetes mellitus with other circulatory complications: Secondary | ICD-10-CM | POA: Diagnosis not present

## 2017-06-17 DIAGNOSIS — M1990 Unspecified osteoarthritis, unspecified site: Secondary | ICD-10-CM | POA: Insufficient documentation

## 2017-06-17 DIAGNOSIS — I2510 Atherosclerotic heart disease of native coronary artery without angina pectoris: Secondary | ICD-10-CM | POA: Insufficient documentation

## 2017-06-17 DIAGNOSIS — I6310 Cerebral infarction due to embolism of unspecified precerebral artery: Secondary | ICD-10-CM | POA: Diagnosis not present

## 2017-06-17 DIAGNOSIS — M25512 Pain in left shoulder: Secondary | ICD-10-CM | POA: Insufficient documentation

## 2017-06-17 DIAGNOSIS — M542 Cervicalgia: Secondary | ICD-10-CM | POA: Insufficient documentation

## 2017-06-17 DIAGNOSIS — I6523 Occlusion and stenosis of bilateral carotid arteries: Secondary | ICD-10-CM | POA: Diagnosis not present

## 2017-06-17 DIAGNOSIS — D1779 Benign lipomatous neoplasm of other sites: Secondary | ICD-10-CM | POA: Diagnosis not present

## 2017-06-17 DIAGNOSIS — Z86711 Personal history of pulmonary embolism: Secondary | ICD-10-CM | POA: Insufficient documentation

## 2017-06-17 DIAGNOSIS — K219 Gastro-esophageal reflux disease without esophagitis: Secondary | ICD-10-CM | POA: Insufficient documentation

## 2017-06-17 DIAGNOSIS — Z955 Presence of coronary angioplasty implant and graft: Secondary | ICD-10-CM | POA: Insufficient documentation

## 2017-06-17 DIAGNOSIS — Z79899 Other long term (current) drug therapy: Secondary | ICD-10-CM | POA: Insufficient documentation

## 2017-06-17 DIAGNOSIS — Z91018 Allergy to other foods: Secondary | ICD-10-CM | POA: Insufficient documentation

## 2017-06-17 DIAGNOSIS — I272 Pulmonary hypertension, unspecified: Secondary | ICD-10-CM | POA: Diagnosis not present

## 2017-06-17 DIAGNOSIS — R4701 Aphasia: Secondary | ICD-10-CM | POA: Insufficient documentation

## 2017-06-17 DIAGNOSIS — Z7902 Long term (current) use of antithrombotics/antiplatelets: Secondary | ICD-10-CM | POA: Insufficient documentation

## 2017-06-17 DIAGNOSIS — Z8673 Personal history of transient ischemic attack (TIA), and cerebral infarction without residual deficits: Secondary | ICD-10-CM | POA: Diagnosis present

## 2017-06-17 DIAGNOSIS — R200 Anesthesia of skin: Principal | ICD-10-CM

## 2017-06-17 DIAGNOSIS — Z886 Allergy status to analgesic agent status: Secondary | ICD-10-CM | POA: Insufficient documentation

## 2017-06-17 DIAGNOSIS — Z7984 Long term (current) use of oral hypoglycemic drugs: Secondary | ICD-10-CM | POA: Insufficient documentation

## 2017-06-17 DIAGNOSIS — K589 Irritable bowel syndrome without diarrhea: Secondary | ICD-10-CM | POA: Insufficient documentation

## 2017-06-17 DIAGNOSIS — Z6841 Body Mass Index (BMI) 40.0 and over, adult: Secondary | ICD-10-CM | POA: Insufficient documentation

## 2017-06-17 DIAGNOSIS — Z8711 Personal history of peptic ulcer disease: Secondary | ICD-10-CM | POA: Insufficient documentation

## 2017-06-17 LAB — COMPREHENSIVE METABOLIC PANEL
ALT: 28 U/L (ref 17–63)
AST: 31 U/L (ref 15–41)
Albumin: 3.6 g/dL (ref 3.5–5.0)
Alkaline Phosphatase: 37 U/L — ABNORMAL LOW (ref 38–126)
Anion gap: 13 (ref 5–15)
BUN: 10 mg/dL (ref 6–20)
CO2: 25 mmol/L (ref 22–32)
Calcium: 8.8 mg/dL — ABNORMAL LOW (ref 8.9–10.3)
Chloride: 98 mmol/L — ABNORMAL LOW (ref 101–111)
Creatinine, Ser: 0.72 mg/dL (ref 0.61–1.24)
GFR calc Af Amer: >60 mL/min (ref >60)
GFR calc non Af Amer: >60 mL/min (ref >60)
Glucose, Bld: 249 mg/dL — ABNORMAL HIGH (ref 65–99)
Potassium: 3.4 mmol/L — ABNORMAL LOW (ref 3.5–5.1)
Sodium: 136 mmol/L (ref 135–145)
Total Bilirubin: 0.6 mg/dL (ref 0.3–1.2)
Total Protein: 7.1 g/dL (ref 6.5–8.1)

## 2017-06-17 LAB — URINALYSIS, ROUTINE W REFLEX MICROSCOPIC
Bacteria, UA: NONE SEEN
Bilirubin Urine: NEGATIVE
Glucose, UA: >=500 mg/dL — AB
Hgb urine dipstick: NEGATIVE
Ketones, ur: NEGATIVE mg/dL
Leukocytes, UA: NEGATIVE
Nitrite: NEGATIVE
Protein, ur: NEGATIVE mg/dL
Specific Gravity, Urine: 1.002 — ABNORMAL LOW (ref 1.005–1.030)
Squamous Epithelial / LPF: NONE SEEN
pH: 5 (ref 5.0–8.0)

## 2017-06-17 LAB — CBC
HCT: 35.5 % — ABNORMAL LOW (ref 39.0–52.0)
Hemoglobin: 11.2 g/dL — ABNORMAL LOW (ref 13.0–17.0)
MCH: 28.1 pg (ref 26.0–34.0)
MCHC: 31.5 g/dL (ref 30.0–36.0)
MCV: 89 fL (ref 78.0–100.0)
Platelets: 204 10*3/uL (ref 150–400)
RBC: 3.99 MIL/uL — ABNORMAL LOW (ref 4.22–5.81)
RDW: 14.5 % (ref 11.5–15.5)
WBC: 10.1 10*3/uL (ref 4.0–10.5)

## 2017-06-17 LAB — RAPID URINE DRUG SCREEN, HOSP PERFORMED
Amphetamines: NOT DETECTED
Barbiturates: NOT DETECTED
Benzodiazepines: NOT DETECTED
Cocaine: NOT DETECTED
Opiates: NOT DETECTED
Tetrahydrocannabinol: NOT DETECTED

## 2017-06-17 LAB — DIFFERENTIAL
Basophils Absolute: 0 10*3/uL (ref 0.0–0.1)
Basophils Relative: 0 %
Eosinophils Absolute: 0.1 10*3/uL (ref 0.0–0.7)
Eosinophils Relative: 1 %
Lymphocytes Relative: 26 %
Lymphs Abs: 2.6 10*3/uL (ref 0.7–4.0)
Monocytes Absolute: 0.7 10*3/uL (ref 0.1–1.0)
Monocytes Relative: 7 %
Neutro Abs: 6.6 10*3/uL (ref 1.7–7.7)
Neutrophils Relative %: 66 %

## 2017-06-17 LAB — CBG MONITORING, ED
Glucose-Capillary: 230 mg/dL — ABNORMAL HIGH (ref 65–99)
Glucose-Capillary: 245 mg/dL — ABNORMAL HIGH (ref 65–99)
Glucose-Capillary: 257 mg/dL — ABNORMAL HIGH (ref 65–99)

## 2017-06-17 LAB — GLUCOSE, CAPILLARY
Glucose-Capillary: 253 mg/dL — ABNORMAL HIGH (ref 65–99)
Glucose-Capillary: 242 mg/dL — ABNORMAL HIGH (ref 65–99)
Glucose-Capillary: 253 mg/dL — ABNORMAL HIGH (ref 65–99)

## 2017-06-17 LAB — I-STAT TROPONIN, ED: Troponin i, poc: 0 ng/mL (ref 0.00–0.08)

## 2017-06-17 LAB — PROTIME-INR
INR: 0.93
Prothrombin Time: 12.5 seconds (ref 11.4–15.2)

## 2017-06-17 LAB — I-STAT CHEM 8, ED
BUN: 11 mg/dL (ref 6–20)
Calcium, Ion: 0.88 mmol/L — CL (ref 1.15–1.40)
Chloride: 99 mmol/L — ABNORMAL LOW (ref 101–111)
Creatinine, Ser: 0.5 mg/dL — ABNORMAL LOW (ref 0.61–1.24)
Glucose, Bld: 249 mg/dL — ABNORMAL HIGH (ref 65–99)
HCT: 34 % — ABNORMAL LOW (ref 39.0–52.0)
Hemoglobin: 11.6 g/dL — ABNORMAL LOW (ref 13.0–17.0)
Potassium: 3.4 mmol/L — ABNORMAL LOW (ref 3.5–5.1)
Sodium: 136 mmol/L (ref 135–145)
TCO2: 25 mmol/L (ref 22–32)

## 2017-06-17 LAB — APTT: aPTT: 27 seconds (ref 24–36)

## 2017-06-17 MED ORDER — PREGABALIN 75 MG PO CAPS
75.0000 mg | ORAL_CAPSULE | Freq: Two times a day (BID) | ORAL | Status: DC
  Administered 2017-06-17 – 2017-06-18 (×3): 75 mg via ORAL
  Filled 2017-06-17 (×3): qty 1

## 2017-06-17 MED ORDER — ASPIRIN 81 MG PO CHEW
81.0000 mg | CHEWABLE_TABLET | Freq: Every day | ORAL | Status: DC
  Administered 2017-06-17 – 2017-06-18 (×2): 81 mg via ORAL
  Filled 2017-06-17 (×2): qty 1

## 2017-06-17 MED ORDER — ATORVASTATIN CALCIUM 80 MG PO TABS
80.0000 mg | ORAL_TABLET | Freq: Every day | ORAL | Status: DC
  Administered 2017-06-17: 80 mg via ORAL
  Filled 2017-06-17: qty 1

## 2017-06-17 MED ORDER — ACETAMINOPHEN 325 MG PO TABS: 650.0000 mg | ORAL_TABLET | ORAL | Status: DC | PRN

## 2017-06-17 MED ORDER — SENNOSIDES-DOCUSATE SODIUM 8.6-50 MG PO TABS: 1.0000 | ORAL_TABLET | Freq: Every evening | ORAL | Status: DC | PRN

## 2017-06-17 MED ORDER — OXYCODONE HCL 5 MG PO TABS
10.0000 mg | ORAL_TABLET | Freq: Three times a day (TID) | ORAL | Status: DC | PRN
  Administered 2017-06-17 – 2017-06-18 (×3): 10 mg via ORAL
  Filled 2017-06-17 (×3): qty 2

## 2017-06-17 MED ORDER — CARVEDILOL 25 MG PO TABS
25.0000 mg | ORAL_TABLET | Freq: Two times a day (BID) | ORAL | Status: DC
  Administered 2017-06-17 – 2017-06-18 (×3): 25 mg via ORAL
  Filled 2017-06-17: qty 2
  Filled 2017-06-17 (×2): qty 1

## 2017-06-17 MED ORDER — POTASSIUM CHLORIDE 20 MEQ PO PACK
20.0000 meq | PACK | Freq: Two times a day (BID) | ORAL | Status: DC
  Administered 2017-06-17: 20 meq via ORAL
  Filled 2017-06-17: qty 1

## 2017-06-17 MED ORDER — ACETAMINOPHEN 160 MG/5ML PO SOLN: 650.0000 mg | ORAL | Status: DC | PRN

## 2017-06-17 MED ORDER — TIZANIDINE HCL 4 MG PO TABS
2.0000 mg | ORAL_TABLET | Freq: Four times a day (QID) | ORAL | Status: DC | PRN
  Administered 2017-06-17: 2 mg via ORAL
  Filled 2017-06-17: qty 1

## 2017-06-17 MED ORDER — IOPAMIDOL (ISOVUE-370) INJECTION 76%
50.0000 mL | Freq: Once | INTRAVENOUS | Status: AC | PRN
  Administered 2017-06-17: 50 mL via INTRAVENOUS

## 2017-06-17 MED ORDER — INSULIN ASPART 100 UNIT/ML ~~LOC~~ SOLN
0.0000 [IU] | Freq: Three times a day (TID) | SUBCUTANEOUS | Status: DC
  Administered 2017-06-17 – 2017-06-18 (×3): 5 [IU] via SUBCUTANEOUS
  Filled 2017-06-17: qty 1

## 2017-06-17 MED ORDER — POTASSIUM CHLORIDE CRYS ER 20 MEQ PO TBCR
40.0000 meq | EXTENDED_RELEASE_TABLET | ORAL | Status: AC
  Administered 2017-06-17 – 2017-06-18 (×2): 40 meq via ORAL
  Filled 2017-06-17 (×3): qty 2

## 2017-06-17 MED ORDER — IOPAMIDOL (ISOVUE-370) INJECTION 76%
INTRAVENOUS | Status: AC
  Filled 2017-06-17: qty 50

## 2017-06-17 MED ORDER — ENOXAPARIN SODIUM 40 MG/0.4ML ~~LOC~~ SOLN
40.0000 mg | SUBCUTANEOUS | Status: DC
  Administered 2017-06-18: 40 mg via SUBCUTANEOUS
  Filled 2017-06-17: qty 0.4

## 2017-06-17 MED ORDER — TRAZODONE HCL 50 MG PO TABS
50.0000 mg | ORAL_TABLET | Freq: Every day | ORAL | Status: DC
  Administered 2017-06-17: 50 mg via ORAL
  Filled 2017-06-17: qty 1

## 2017-06-17 MED ORDER — PANTOPRAZOLE SODIUM 20 MG PO TBEC
20.0000 mg | DELAYED_RELEASE_TABLET | Freq: Two times a day (BID) | ORAL | Status: DC
  Administered 2017-06-17 – 2017-06-18 (×3): 20 mg via ORAL
  Filled 2017-06-17 (×3): qty 1

## 2017-06-17 MED ORDER — ACETAMINOPHEN 650 MG RE SUPP: 650.0000 mg | RECTAL | Status: DC | PRN

## 2017-06-17 MED ORDER — ONDANSETRON 4 MG PO TBDP: 4.0000 mg | ORAL_TABLET | Freq: Three times a day (TID) | ORAL | Status: DC | PRN

## 2017-06-17 MED ORDER — STROKE: EARLY STAGES OF RECOVERY BOOK
Freq: Once | Status: DC
  Filled 2017-06-17: qty 1

## 2017-06-17 MED ORDER — CLOPIDOGREL BISULFATE 75 MG PO TABS
75.0000 mg | ORAL_TABLET | Freq: Every day | ORAL | Status: DC
  Administered 2017-06-17 – 2017-06-18 (×2): 75 mg via ORAL
  Filled 2017-06-17 (×2): qty 1

## 2017-06-17 MED ORDER — FAMOTIDINE 20 MG PO TABS
20.0000 mg | ORAL_TABLET | Freq: Two times a day (BID) | ORAL | Status: DC
  Administered 2017-06-17 – 2017-06-18 (×3): 20 mg via ORAL
  Filled 2017-06-17 (×3): qty 1

## 2017-06-17 MED ORDER — HYDRALAZINE HCL 20 MG/ML IJ SOLN: 10.0000 mg | INTRAMUSCULAR | Status: DC | PRN

## 2017-06-17 MED ORDER — SERTRALINE HCL 50 MG PO TABS
50.0000 mg | ORAL_TABLET | Freq: Every day | ORAL | Status: DC
  Administered 2017-06-17 – 2017-06-18 (×2): 50 mg via ORAL
  Filled 2017-06-17 (×2): qty 1

## 2017-06-17 NOTE — Evaluation (Signed)
Speech Language Pathology Evaluation Patient Details Name: [name] MRN: [id number] DOB: November 22, 1960 Today's Date: 06/17/2017 Time: 1517-6160 SLP Time Calculation (min) (ACUTE ONLY): 10 min  Problem List:  Patient Active Problem List   Diagnosis Date Noted  . Cerebral embolism with cerebral infarction 06/17/2017  . TIA (transient ischemic attack) 06/17/2017  . Chronic back pain 12/29/2016  . Depression 12/15/2016  . Vitamin D deficiency 12/09/2016  . Right hip pain 12/07/2016  . Hypokalemia 12/07/2016  . Type 2 diabetes mellitus with vascular disease ([place]) 05/31/2016  . Normocytic normochromic anemia 05/31/2016  . CAD (coronary artery disease) 01/06/2016  . DVT (deep venous thrombosis) ([place]) 01/06/2016  . Lactic acidosis 05/28/2014  . Nonspecific chest pain 01/29/2014  . Uncontrolled secondary diabetes with peripheral neuropathy ([place]) 01/29/2014  . Morbid obesity ([place]) 01/29/2014  . Snoring 01/29/2014  . Dyslipidemia 01/29/2014  . HTN (hypertension) 01/29/2014  . Abnormal nuclear stress test 01/29/2014   Past Medical History:  Past Medical History:  Diagnosis Date  . Arthritis   . Back pain   . CAD (coronary artery disease)    a. s/p DES to LAD in 05/2016  . DVT (deep venous thrombosis) ([place])   . Hyperlipidemia   . Hypertension   . IBS (irritable bowel syndrome)   . OSA (obstructive sleep apnea)   . Pancreatitis   . PE (pulmonary thromboembolism) ([place])   . PUD (peptic ulcer disease)   . Renal disorder   . Stroke ([place])   . Type 2 diabetes mellitus ([place])    Past Surgical History:  Past Surgical History:  Procedure Laterality Date  . CARDIAC CATHETERIZATION N/A 05/31/2016   Procedure: Left Heart Cath and Coronary Angiography;  Surgeon: Peter M Martinique, MD;  Location: [facility];  Service: Cardiovascular;  Laterality: N/A;  . CARDIAC CATHETERIZATION N/A 05/31/2016   Procedure: Intravascular Pressure Wire/FFR Study;  Surgeon: Peter M Martinique, MD;  Location:  [place] [facility];  Service: Cardiovascular;  Laterality: N/A;  . CARDIAC CATHETERIZATION N/A 05/31/2016   Procedure: Coronary Stent Intervention;  Surgeon: Peter M Martinique, MD;  Location: [place] [facility];  Service: Cardiovascular;  Laterality: N/A;  . LEFT HEART CATHETERIZATION WITH CORONARY ANGIOGRAM N/A 02/03/2014   Procedure: LEFT HEART CATHETERIZATION WITH CORONARY ANGIOGRAM;  Surgeon: Pixie Casino, MD;  Location: [facility] CATH LAB;  Service: Cardiovascular;  Laterality: N/A;  . left leg stent      HPI:  56 y.o.malewith medical history significant of Stroke, HTN, DM2, CAD. Patient presents to the ED with sudden onset of L arm and shoulder numbness. MR brain did not reveal any acute intracranial abnormality.   Assessment / Plan / Recommendation Clinical Impression  Patient presents with a mild flaccid dysarthria secondary to right-sided facial weakness and decreased right-sided bilabial ROM, leading to mild slurring of speech. Overall speech intelligibility was 95% and patient also appeared to be tired, eyes heavy, which could be exacerbating symptoms.     SLP Assessment  SLP Recommendation/Assessment: Patient does not need any further Speech Lanaguage Pathology Services SLP Visit Diagnosis: Dysarthria and anarthria (R47.1)    Follow Up Recommendations  None    Frequency and Duration   N/A        SLP Evaluation Cognition  Overall Cognitive Status: Within Functional Limits for tasks assessed       Comprehension  Auditory Comprehension Overall Auditory Comprehension: Appears within functional limits for tasks assessed    Expression Expression Primary Mode of Expression: Verbal Verbal Expression Overall  Verbal Expression: Appears within functional limits for tasks assessed   Oral / Motor  Oral Motor/Sensory Function Overall Oral Motor/Sensory Function: Mild impairment Facial ROM: Reduced right Facial Symmetry: Abnormal symmetry right Lingual ROM: Within Functional  Limits Lingual Symmetry: Within Functional Limits Lingual Strength: Within Functional Limits Lingual Sensation: Within Functional Limits Velum: Within Functional Limits Mandible: Within Functional Limits Motor Speech Overall Motor Speech: Impaired Phonation: Normal Resonance: Within functional limits Articulation: Impaired Level of Impairment: Conversation Intelligibility: Intelligibility reduced Word: 75-100% accurate Phrase: 75-100% accurate Sentence: 75-100% accurate Conversation: 75-100% accurate Motor Planning: Witnin functional limits   GO          Functional Limitations: Motor speech Motor Speech Current Status [phone]): At least 1 percent but less than 20 percent impaired, limited or restricted Motor Speech Goal Status [phone]): At least 1 percent but less than 20 percent impaired, limited or restricted Motor Speech Goal Status [phone]): At least 1 percent but less than 20 percent impaired, limited or restricted         [name] 06/17/2017, 12:37 PM   [name], [place], [place] 06/17/17 12:37 PM

## 2017-06-17 NOTE — Progress Notes (Signed)
STROKE TEAM PROGRESS NOTE   HISTORY OF PRESENT ILLNESS (per record) [name] is an 56 y.o. male who presented via EMS as a Code Stroke for sudden onset of left arm and shoulder numbness at 0010. 5 minutes later the patient began to have headache, dizziness, blurred vision and intermittent dysphagia/aphasia. CBG was 244. The patient arrived to the ED at 0050 and continued to complain of the above symptoms, but felt that they were improving.   Emergent CT head was negative for acute abnormality. CTA of head and neck showed no LVO.   His PMHx includes CAD, DVT, IBS, OSA, PE, PUD, HLD, HTN, DM2, renal disorder and stroke. He was recently seen in the ED on 8/20 for sudden onset of bloody emesis x 3.   Home medications include ASA, Plavix and Lipitor.   LSN: [id number] tPA Given: No: NIHSS of 1 with rapidly resolving symptoms.    SUBJECTIVE (INTERVAL HISTORY) His RNis at the bedside.  He has no complaints. He has h/o spinal stenosis and c/o chronic neck and left shoulder pain  OBJECTIVE Temp:  [98.7 F (37.1 C)] 98.7 F (37.1 C) (08/25 0100) Pulse Rate:  [68-79] 70 (08/25 0733) Cardiac Rhythm: Normal sinus rhythm (08/25 0100) Resp:  [14-19] 19 (08/25 0733) BP: (111-156)/(67-89) 150/89 (08/25 0733) SpO2:  [90 %-97 %] 97 % (08/25 0733) Weight:  [324 lb 15.3 oz (147.4 kg)] 324 lb 15.3 oz (147.4 kg) (08/25 0124)  CBC:  Recent Labs Lab 06/17/17 0051 06/17/17 0100  WBC 10.1  --   NEUTROABS 6.6  --   HGB 11.2* 11.6*  HCT 35.5* 34.0*  MCV 89.0  --   PLT 204  --     Basic Metabolic Panel:  Recent Labs Lab 06/17/17 0051 06/17/17 0100  NA 136 136  K 3.4* 3.4*  CL 98* 99*  CO2 25  --   GLUCOSE 249* 249*  BUN 10 11  CREATININE 0.72 0.50*  CALCIUM 8.8*  --     Lipid Panel:    Component Value Date/Time   CHOL 82 05/31/2016 0114   TRIG 244 (H) 05/31/2016 0114   HDL 26 (L) 05/31/2016 0114   CHOLHDL 3.2 05/31/2016 0114   VLDL 49 (H) 05/31/2016 0114   LDLCALC 7 05/31/2016  0114   HgbA1c:  Lab Results  Component Value Date   HGBA1C 9.2 (H) 05/28/2014   Urine Drug Screen:    Component Value Date/Time   LABOPIA NONE DETECTED 06/17/2017 0132   COCAINSCRNUR NONE DETECTED 06/17/2017 0132   LABBENZ NONE DETECTED 06/17/2017 0132   AMPHETMU NONE DETECTED 06/17/2017 0132   THCU NONE DETECTED 06/17/2017 0132   LABBARB NONE DETECTED 06/17/2017 0132    Alcohol Level     Component Value Date/Time   ETH <5 01/22/2017 1225    IMAGING   Ct Angio Head W Or Wo Contrast Ct Angio Neck W Or Wo Contrast 06/17/2017 1. Suboptimal arterial contrast timing. Negative for large vessel occlusion.  2. Widespread intracranial atherosclerosis, including involvement of the bilateral M1 segments. No MCA branch occlusion identified.  3. No hemodynamically significant stenosis identified in the neck.  4. Chronically enlarged main pulmonary artery suggesting pulmonary artery hypertension.     Mr Brain Wo Contrast 06/17/2017 Prematurely truncated exam demonstrating no definite acute or focal intracranial findings.      Ct Head Code Stroke Wo Contrast 06/17/2017 1. Stable and normal noncontrast CT appearance of the brain.  2. ASPECTS is 10.     Transthoracic Echocardiogram -  pending     PHYSICAL EXAM Vitals:   06/17/17 0800 06/17/17 0900 06/17/17 1000 06/17/17 1200  BP: 128/70 (!) 153/81 121/85 131/63  Pulse: 65 71 71 72  Resp: (!) 21 15 15 18   Temp:    98 F (36.7 C)  TempSrc:    Oral  SpO2: 99% 91% 95% 95%  Weight:       obese middle aged african [place] male not in distress. . Afebrile. Head is nontraumatic. Neck is supple without bruit.    Cardiac exam no murmur or gallop. Lungs are clear to auscultation. Distal pulses are well felt. Neurological Exam ;  Awake  Alert oriented x 3. Normal speech and language.eye movements full without nystagmus.fundi were not visualized. Vision acuity and fields appear normal. Hearing is normal. Palatal movements are  normal. Face symmetric. Tongue midline. Normal strength, tone, reflexes and coordination. Normal sensation. Gait deferred.   ASSESSMENT/PLAN [name] is a 56 y.o. male with history of coronary artery disease, DVT, IBS, obstructive sleep apnea, pulmonary embolus, peptic ulcer disease, hyperlipidemia, hypertension, diabetes mellitus, renal disorder, previous stroke, and recent bloody emesis presenting with left arm and shoulder numbness, headache, dizziness, blurred vision, intermittent dysphagia/aphasia and an elevated glucose of 244. He did not receive IV t-PA due to rapidly resolving deficits.  Possible TIA: versus cervical radiculopathy from spinal stenosis patient refusing MRI C Spine a sprefers open scanner  Resultant  No deficits  CT head - normal  MRI head - limited exam with no significant findings.  MRA head - not performed  Carotid Doppler - CTA neck  CTA H&N - negative for large vessel occlusion.   2D Echo - pending  LDL - pending  HgbA1c - pending  VTE prophylaxis - Lovenox  Diet Carb Modified Fluid consistency: Thin; Room service appropriate? Yes  aspirin 81 mg daily and clopidogrel 75 mg daily prior to admission, now on aspirin 81 mg daily and clopidogrel 75 mg daily  Patient counseled to be compliant with his antithrombotic medications  Ongoing aggressive stroke risk factor management  Therapy recommendations: pending  Disposition: Pending  Hypertension  Stable  Permissive hypertension (OK if < 220/120) but gradually normalize in 5-7 days  Long-term BP goal normotensive  Hyperlipidemia  Home meds:  Lipitor 80 mg daily resumed in hospital  LDL pending, goal < 70  Continue statin at discharge  Diabetes  HgbA1c pending, goal < 7.0  Unc / Controlled  Other Stroke Risk Factors  Obesity, Body mass index is 46.63 kg/m., recommend weight loss, diet and exercise as appropriate   Hx stroke/TIA  Coronary artery disease  Obstructive  sleep apnea  Other Active Problems  Mild anemia - 11.6 / 34  Hypokalemia - 3.4  Chronically enlarged main pulmonary artery suggesting pulmonary artery hypertension.   Hx of previous stroke, DVT and PE - ? hypercoagulable  Hospital day # 0  I have personally examined this patient, reviewed notes, independently viewed imaging studies, participated in medical decision making and plan of care.ROS completed by me personally and pertinent positives fully documented  I have made any additions or clarifications directly to the above note.  He presented with transient left upper extremity numbness of unclear etiology TIA versus so cervical radiculopathy. Continue dual antiplatelet therapy given history of cardiac disease and ongoing stroke evaluation. Patient is refusing to undergo MRI scan of the cervical spine due to claustrophobia and prefers to d do this on outpatient basis on the open scanner. Greater than 50% time  during this 35 minute visit was spent on counseling and and coordination of care about his TIA and spinal stenosis discussion, evaluation and treatment plan and answering questions. Antony Contras, MD Medical Director [facility] Pager: [phone] 06/17/2017 3:59 PM   To contact Stroke Continuity provider, please refer to [email or web address] After hours, contact General Neurology

## 2017-06-17 NOTE — ED Provider Notes (Signed)
TIME SEEN: 12:52 AM  CHIEF COMPLAINT: Code stroke  HPI: Patient is a 56 year old obese male with history of CAD with drug-eluting stent to LAD in August 2017 at [facility], hypertension, hyperlipidemia, PE, diabetes, previous stroke who presents emergency department as a code stroke. Patient called EMS because he started having difficulty with his speech, slurred speech and left arm numbness that started 12:15 AM. He is on Plavix and aspirin. States symptoms feel similar to his previous stroke but that time he also had blurry vision which he does not have today. No weakness. Is complaining of diffuse mild headache. No head injury. No chest pain or shortness of breath. Was recently seen in the hospital for nosebleeds and hematemesis. Blood glucose 224 with EMS. Here it is 245.  ROS: See HPI Constitutional: no fever  Eyes: no drainage  ENT: no runny nose   Cardiovascular:  no chest pain  Resp: no SOB  GI: no vomiting GU: no dysuria Integumentary: no rash  Allergy: no hives  Musculoskeletal: no leg swelling  Neurological: slurred speech ROS otherwise negative  PAST MEDICAL HISTORY/PAST SURGICAL HISTORY:  Past Medical History:  Diagnosis Date  . Arthritis   . Back pain   . CAD (coronary artery disease)    a. s/p DES to LAD in 05/2016  . DVT (deep venous thrombosis) ([place])   . Hyperlipidemia   . Hypertension   . IBS (irritable bowel syndrome)   . OSA (obstructive sleep apnea)   . Pancreatitis   . PE (pulmonary thromboembolism) ([place])   . PUD (peptic ulcer disease)   . Renal disorder   . Stroke ([place])   . Type 2 diabetes mellitus ([facility])     MEDICATIONS:  Prior to Admission medications   Medication Sig Start Date End Date Taking? Authorizing Provider  amLODipine (NORVASC) 10 MG tablet Take 10 mg by mouth daily.    [provider]  aspirin 81 MG chewable tablet Chew 1 tablet (81 mg total) by mouth daily. 06/01/16   Elwin Mocha, MD  atorvastatin (LIPITOR) 80 MG  tablet Take 1 tablet (80 mg total) by mouth daily. 01/26/17   Ahmed Prima, Fransisco Hertz, PA-C  carvedilol (COREG) 25 MG tablet Take 1 tablet (25 mg total) by mouth 2 (two) times daily with a meal. 01/26/17   Strader, [place] M, PA-C  clopidogrel (PLAVIX) 75 MG tablet Take 75 mg by mouth daily.    [provider]  famotidine (PEPCID) 20 MG tablet Take 1 tablet (20 mg total) by mouth 2 (two) times daily. 06/09/17   Varney Biles, MD  furosemide (LASIX) 40 MG tablet Take 1 tablet (40 mg total) by mouth 2 (two) times daily. 01/26/17   Strader, Fransisco Hertz, PA-C  glipiZIDE (GLUCOTROL) 10 MG tablet Take 10 mg by mouth 2 (two) times daily.  11/13/15   [provider]  hydrALAZINE (APRESOLINE) 50 MG tablet Take 50 mg by mouth 2 (two) times daily.    [provider]  isosorbide dinitrate (ISORDIL) 30 MG tablet Take 1 tablet (30 mg total) by mouth 2 (two) times daily. 11/22/16   Strader, Fransisco Hertz, PA-C  LYRICA 75 MG capsule Take 75 mg by mouth 2 (two) times daily. 04/03/17   [provider]  metFORMIN (GLUCOPHAGE-XR) 500 MG 24 hr tablet Take 1,000 mg by mouth 2 (two) times daily. 04/14/17   [provider]  nitroGLYCERIN (NITROSTAT) 0.4 MG SL tablet Place 0.4 mg under the tongue every 5 (five) minutes as needed for  chest pain. 12/17/16   [provider]  ondansetron (ZOFRAN ODT) 4 MG disintegrating tablet Take 1 tablet (4 mg total) by mouth every 8 (eight) hours as needed for nausea or vomiting. 06/09/17   Varney Biles, MD  Oxycodone HCl 10 MG TABS Take 10 mg by mouth 3 (three) times daily as needed (for pain).  04/18/17   [provider]  pantoprazole (PROTONIX) 20 MG tablet Take 1 tablet (20 mg total) by mouth 2 (two) times daily. 01/22/17   Gareth Morgan, MD  potassium chloride SA (K-DUR,KLOR-CON) 20 MEQ tablet Take 20 mEq by mouth daily.    [provider]  sertraline (ZOLOFT) 50 MG tablet Take 50 mg by mouth daily.    [provider]   sitaGLIPtin (JANUVIA) 100 MG tablet Take 100 mg by mouth daily.    [provider]  tiZANidine (ZANAFLEX) 2 MG tablet Take 2 mg by mouth every 6 (six) hours as needed for muscle spasms.    [provider]  traZODone (DESYREL) 50 MG tablet Take 1 tablet (50 mg total) by mouth at bedtime. 12/15/16   Florinda Marker, MD    ALLERGIES:  Allergies  Allergen Reactions  . Coconut Flavor [Flavoring Agent] Hives  . Coconut Oil Hives  . Ibuprofen Other (See Comments)    Made gastric ulcers worse    SOCIAL HISTORY:  Social History  Substance Use Topics  . Smoking status: Never Smoker  . Smokeless tobacco: Never Used  . Alcohol use No    FAMILY HISTORY: Family History  Problem Relation Age of Onset  . Cancer Father   . Hypertension Mother   . Diabetes Mother   . Breast cancer Mother   . Hypertension Brother   . Diabetes Brother   . Hypertension Sister   . Diabetes Sister     EXAM: There were no vitals taken for this visit. CONSTITUTIONAL: Alert and oriented and responds appropriately to questions. Well-appearing; well-nourished, Obese HEAD: Normocephalic EYES: Conjunctivae clear, pupils appear equal, EOMI ENT: normal nose; moist mucous membranes NECK: Supple, no meningismus, no nuchal rigidity, no LAD  CARD: RRR; S1 and S2 appreciated; no murmurs, no clicks, no rubs, no gallops RESP: Normal chest excursion without splinting or tachypnea; breath sounds clear and equal bilaterally; no wheezes, no rhonchi, no rales, no hypoxia or respiratory distress, speaking full sentences ABD/GI: Normal bowel sounds; non-distended; soft, non-tender, no rebound, no guarding, no peritoneal signs, no hepatosplenomegaly BACK:  The back appears normal and is non-tender to palpation, there is no CVA tenderness EXT: Normal ROM in all joints; non-tender to palpation; no edema; normal capillary refill; no cyanosis, no calf tenderness or swelling    SKIN: Normal color for age and race; warm;  no rash NEURO: Moves all extremities equally, strength 5/5 in all 4 extremities, sensation to light touch diminished in the left arm but otherwise diffusely normal, slightly slurred speech on exam PSYCH: The patient's mood and manner are appropriate. Grooming and personal hygiene are appropriate.  MEDICAL DECISION MAKING: Patient here as a code stroke with left arm numbness and slurred speech. Seen by neurology. CT and CTA showed no large vessel occlusion. Widespread intracranial arthrosclerosis. Code stroke canceled by neurology. The recommended medical admission. Will discuss with hospitalist. PCP is Dr. [name].  Dr. Cheral Marker with neurology saw patient immediately upon arrival to the emergency part. We appreciate his help. Given AN NIH stroke scale of 1 and improving symptoms, patient not a TPA candidate per neurology.  ED PROGRESS: Patient's labs unremarkable. We'll discuss with medicine for admission for stroke workup per neurology recommendations.   4:03 AM Discussed patient's case with hospitalist, Dr. Alcario Drought.  I have recommended admission and patient (and family if present) agree with this plan. Admitting physician will place admission orders.   I reviewed all nursing notes, vitals, pertinent previous records, EKGs, lab and urine results, imaging (as available).        Date: 06/17/2017 1:20 AM  Rate: 75  Rhythm: normal sinus rhythm  QRS Axis: normal  Intervals: normal  ST/T Wave abnormalities: normal  Conduction Disutrbances: none  Narrative Interpretation: unremarkable       Ward, Delice Bison, DO 06/17/17 0403

## 2017-06-17 NOTE — H&P (Signed)
History and Physical    [name] [name]:[id number] DOB: 08-18-1961 DOA: 06/17/2017  PCP: Rogers Blocker, MD  Patient coming from: Home  I have personally briefly reviewed patient's old medical records in [place]  Chief Complaint: L arm and shoulder numbness  HPI: [name] is a 57 y.o. male with medical history significant of Stroke, HTN, DM2, CAD.  Patient presents to the ED with sudden onset of L arm and shoulder numbness at 0010 this morning.  5 min later patient began to have a headache, dizziness, blurred vision, and intermittent dysphagia / aphasia.  CBG 244.   ED Course: Patient arrived to ED at 0050.  Symptoms improving with NIH of 1.  CTA head and neg showed no acute abnormality.  Hospitalist asked to admit.    Review of Systems: As per HPI otherwise 10 point review of systems negative.   Past Medical History:  Diagnosis Date  . Arthritis   . Back pain   . CAD (coronary artery disease)    a. s/p DES to LAD in 05/2016  . DVT (deep venous thrombosis) ([place])   . Hyperlipidemia   . Hypertension   . IBS (irritable bowel syndrome)   . OSA (obstructive sleep apnea)   . Pancreatitis   . PE (pulmonary thromboembolism) ([place])   . PUD (peptic ulcer disease)   . Renal disorder   . Stroke ([place])   . Type 2 diabetes mellitus ([place])     Past Surgical History:  Procedure Laterality Date  . CARDIAC CATHETERIZATION N/A 05/31/2016   Procedure: Left Heart Cath and Coronary Angiography;  Surgeon: Peter M Martinique, MD;  Location: [facility];  Service: Cardiovascular;  Laterality: N/A;  . CARDIAC CATHETERIZATION N/A 05/31/2016   Procedure: Intravascular Pressure Wire/FFR Study;  Surgeon: Peter M Martinique, MD;  Location: [facility];  Service: Cardiovascular;  Laterality: N/A;  . CARDIAC CATHETERIZATION N/A 05/31/2016   Procedure: Coronary Stent Intervention;  Surgeon: Peter M Martinique, MD;  Location: [facility];  Service: Cardiovascular;  Laterality: N/A;    . LEFT HEART CATHETERIZATION WITH CORONARY ANGIOGRAM N/A 02/03/2014   Procedure: LEFT HEART CATHETERIZATION WITH CORONARY ANGIOGRAM;  Surgeon: Pixie Casino, MD;  Location: [facility];  Service: Cardiovascular;  Laterality: N/A;  . left leg stent        reports that he has never smoked. He has never used smokeless tobacco. He reports that he does not drink alcohol or use drugs.  Allergies  Allergen Reactions  . Coconut Flavor [Flavoring Agent] Hives  . Coconut Oil Hives  . Ibuprofen Other (See Comments)    Made gastric ulcers worse    Family History  Problem Relation Age of Onset  . Cancer Father   . Hypertension Mother   . Diabetes Mother   . Breast cancer Mother   . Hypertension Brother   . Diabetes Brother   . Hypertension Sister   . Diabetes Sister      Prior to Admission medications   Medication Sig Start Date End Date Taking? Authorizing Provider  amLODipine (NORVASC) 10 MG tablet Take 10 mg by mouth daily.   Yes [provider]  aspirin 81 MG chewable tablet Chew 1 tablet (81 mg total) by mouth daily. 06/01/16  Yes Elwin Mocha, MD  atorvastatin (LIPITOR) 80 MG tablet Take 1 tablet (80 mg total) by mouth daily. 01/26/17  Yes Strader, [place] M, PA-C  carvedilol (COREG) 25 MG tablet Take 1  tablet (25 mg total) by mouth 2 (two) times daily with a meal. 01/26/17  Yes Strader, [place] M, PA-C  clopidogrel (PLAVIX) 75 MG tablet Take 75 mg by mouth daily.   Yes [provider]  famotidine (PEPCID) 20 MG tablet Take 1 tablet (20 mg total) by mouth 2 (two) times daily. 06/09/17  Yes Varney Biles, MD  furosemide (LASIX) 40 MG tablet Take 1 tablet (40 mg total) by mouth 2 (two) times daily. 01/26/17  Yes Strader, [place] M, PA-C  glipiZIDE (GLUCOTROL) 10 MG tablet Take 10 mg by mouth 2 (two) times daily.  11/13/15  Yes [provider]  hydrALAZINE (APRESOLINE) 50 MG tablet Take 50 mg by mouth 2 (two) times daily.   Yes [provider]   isosorbide dinitrate (ISORDIL) 30 MG tablet Take 1 tablet (30 mg total) by mouth 2 (two) times daily. 11/22/16  Yes Strader, [place] M, PA-C  LYRICA 75 MG capsule Take 75 mg by mouth 2 (two) times daily. 04/03/17  Yes [provider]  metFORMIN (GLUCOPHAGE-XR) 500 MG 24 hr tablet Take 1,000 mg by mouth 2 (two) times daily. 04/14/17  Yes [provider]  nitroGLYCERIN (NITROSTAT) 0.4 MG SL tablet Place 0.4 mg under the tongue every 5 (five) minutes as needed for chest pain. 12/17/16  Yes [provider]  ondansetron (ZOFRAN ODT) 4 MG disintegrating tablet Take 1 tablet (4 mg total) by mouth every 8 (eight) hours as needed for nausea or vomiting. 06/09/17  Yes Varney Biles, MD  Oxycodone HCl 10 MG TABS Take 10 mg by mouth 3 (three) times daily as needed (for pain).  04/18/17  Yes [provider]  pantoprazole (PROTONIX) 20 MG tablet Take 1 tablet (20 mg total) by mouth 2 (two) times daily. 01/22/17  Yes Gareth Morgan, MD  potassium chloride SA (K-DUR,KLOR-CON) 20 MEQ tablet Take 20 mEq by mouth daily.   Yes [provider]  sertraline (ZOLOFT) 50 MG tablet Take 50 mg by mouth daily.   Yes [provider]  sitaGLIPtin (JANUVIA) 100 MG tablet Take 100 mg by mouth daily.   Yes [provider]  tiZANidine (ZANAFLEX) 2 MG tablet Take 2 mg by mouth every 6 (six) hours as needed for muscle spasms.   Yes [provider]  traZODone (DESYREL) 50 MG tablet Take 1 tablet (50 mg total) by mouth at bedtime. 12/15/16  Yes Florinda Marker, MD    Physical Exam: Vitals:   06/17/17 0330 06/17/17 0415 06/17/17 0430 06/17/17 0445  BP: (!) 152/83 (!) 144/72 137/85 132/70  Pulse: 68 79 74 76  Resp: 18 14 16 16   Temp:      TempSrc:      SpO2: 96% 94% 95% 95%  Weight:        Constitutional: NAD, calm, comfortable Eyes: PERRL, lids and conjunctivae normal ENMT: Mucous membranes are moist. Posterior pharynx clear of any exudate or lesions.Normal  dentition.  Neck: normal, supple, no masses, no thyromegaly Respiratory: clear to auscultation bilaterally, no wheezing, no crackles. Normal respiratory effort. No accessory muscle use.  Cardiovascular: Regular rate and rhythm, no murmurs / rubs / gallops. No extremity edema. 2+ pedal pulses. No carotid bruits.  Abdomen: no tenderness, no masses palpated. No hepatosplenomegaly. Bowel sounds positive.  Musculoskeletal: no clubbing / cyanosis. No joint deformity upper and lower extremities. Good ROM, no contractures. Normal muscle tone.  Skin: no rashes, lesions, ulcers. No induration Neurologic: CN 2-12 grossly intact. Sensation intact, DTR normal. Strength 5/5 in  all 4.  Psychiatric: Normal judgment and insight. Alert and oriented x 3. Normal mood.    Labs on Admission: I have personally reviewed following labs and imaging studies  CBC:  Recent Labs Lab 06/17/17 0051 06/17/17 0100  WBC 10.1  --   NEUTROABS 6.6  --   HGB 11.2* 11.6*  HCT 35.5* 34.0*  MCV 89.0  --   PLT 204  --    Basic Metabolic Panel:  Recent Labs Lab 06/17/17 0051 06/17/17 0100  NA 136 136  K 3.4* 3.4*  CL 98* 99*  CO2 25  --   GLUCOSE 249* 249*  BUN 10 11  CREATININE 0.72 0.50*  CALCIUM 8.8*  --    GFR: Estimated Creatinine Clearance: 155.3 mL/min (A) (by C-G formula based on SCr of 0.5 mg/dL (L)). Liver Function Tests:  Recent Labs Lab 06/17/17 0051  AST 31  ALT 28  ALKPHOS 37*  BILITOT 0.6  PROT 7.1  ALBUMIN 3.6   No results for input(s): LIPASE, AMYLASE in the last 168 hours. No results for input(s): AMMONIA in the last 168 hours. Coagulation Profile:  Recent Labs Lab 06/17/17 0051  INR 0.93   Cardiac Enzymes: No results for input(s): CKTOTAL, CKMB, CKMBINDEX, TROPONINI in the last 168 hours. BNP (last 3 results) No results for input(s): PROBNP in the last 8760 hours. HbA1C: No results for input(s): HGBA1C in the last 72 hours. CBG:  Recent Labs Lab 06/17/17 0049  GLUCAP  245*   Lipid Profile: No results for input(s): CHOL, HDL, LDLCALC, TRIG, CHOLHDL, LDLDIRECT in the last 72 hours. Thyroid Function Tests: No results for input(s): TSH, T4TOTAL, FREET4, T3FREE, THYROIDAB in the last 72 hours. Anemia Panel: No results for input(s): VITAMINB12, FOLATE, FERRITIN, TIBC, IRON, RETICCTPCT in the last 72 hours. Urine analysis:    Component Value Date/Time   COLORURINE COLORLESS (A) 06/17/2017 0131   APPEARANCEUR CLEAR 06/17/2017 0131   LABSPEC 1.002 (L) 06/17/2017 0131   PHURINE 5.0 06/17/2017 0131   GLUCOSEU >=500 (A) 06/17/2017 0131   HGBUR NEGATIVE 06/17/2017 0131   BILIRUBINUR NEGATIVE 06/17/2017 0131   KETONESUR NEGATIVE 06/17/2017 0131   PROTEINUR NEGATIVE 06/17/2017 0131   UROBILINOGEN 0.2 05/27/2014 2312   NITRITE NEGATIVE 06/17/2017 0131   LEUKOCYTESUR NEGATIVE 06/17/2017 0131    Radiological Exams on Admission: Ct Angio Head W Or Wo Contrast  Result Date: 06/17/2017 CLINICAL DATA:  56 year old male code stroke presentation with left side weakness. EXAM: CT ANGIOGRAPHY HEAD AND NECK TECHNIQUE: Multidetector CT imaging of the head and neck was performed using the standard protocol during bolus administration of intravenous contrast. Multiplanar CT image reconstructions and MIPs were obtained to evaluate the vascular anatomy. Carotid stenosis measurements (when applicable) are obtained utilizing NASCET criteria, using the distal internal carotid diameter as the denominator. CONTRAST:  50 mL Isovue 370 COMPARISON:  Head CT without contrast 0057 hours today, and earlier. Brain MRI 04/01/2014. Chest CTA 06/27/2016. FINDINGS: CTA NECK Skeleton: No acute osseous abnormality identified. Degenerative osteophytes and occasional interbody ankylosis in the spine. Upper chest: Mediastinal lipomatosis. Enlarged central pulmonary artery (estimated at 43 mm diameter versus adjacent ascending aorta of 41 mm). Atelectatic changes to the trachea. Negative visible lung  parenchyma. No superior mediastinal lymphadenopathy. Other neck: Negative.  No cervical lymphadenopathy. Aortic arch: Suboptimal contrast bolus, with significant reflux of venous contrast into both internal jugular veins, and left chest wall venous collaterals. There is no significant central venous stenosis visible on these images. Three vessel arch configuration with minimal  arch atherosclerosis. Right carotid system: Mild soft plaque in the right CCA without stenosis. Soft and calcified plaque at the right carotid bifurcation and posterior right ICA origin and bulb with less than 50 % stenosis with respect to the distal vessel. Left carotid system: The left CCA at the level of the hypopharynx is obscured by dense streak artifact from contrast reflux into the left IJ. The visible left CCA is patent without stenosis. Left carotid bifurcation is patent without stenosis. Partially retropharyngeal course of the left ICA in the neck. Vertebral arteries: No proximal right subclavian artery stenosis. Right vertebral artery origin appears normal. Suboptimal proximal V 2 contrast, but no right vertebral artery stenosis identified in the neck. No proximal left subclavian artery stenosis. Suboptimal proximal left vertebral artery contrast, but the left vertebral artery origin and proximal vessel appear grossly normal. The distal left vertebral artery is patent without stenosis. CTA HEAD Posterior circulation: Patent distal vertebral arteries and vertebrobasilar junction with atherosclerosis but no definite hemodynamically significant stenosis. Patent basilar artery without stenosis. SCA and PCA origins are patent. Posterior communicating arteries are diminutive or absent. Bilateral PCA branches are regular but patent. Anterior circulation: Both ICA siphons are patent. Moderate left and mild right ICA siphon calcified plaque. No significant siphon stenosis. Patent carotid termini. Patent MCA and ACA origins. Both MCA M1  segments are patent but irregular compatible with atherosclerosis. Allowing for the suboptimal contrast bolus timing, no left MCA branch occlusion is identified. Dominant right ACA A1 segment. Anterior communicating artery and bilateral ACA branches are patent with mild to moderate A2 segment irregularity. Venous sinuses: Early contrast timing, not evaluated. Anatomic variants: Dominant right A1 segment. Review of the MIP images confirms the above findings IMPRESSION: 1. Suboptimal arterial contrast timing. Negative for large vessel occlusion. This was preliminarily discussed by telephone with Dr. Kerney Elbe on 06/17/2017 at 0118 hours. 2. Widespread intracranial atherosclerosis, including involvement of the bilateral M1 segments. No MCA branch occlusion identified. 3. No hemodynamically significant stenosis identified in the neck. 4. Chronically enlarged main pulmonary artery suggesting pulmonary artery hypertension. Electronically Signed   By: Genevie Ann M.D.   On: 06/17/2017 01:33   Ct Angio Neck W Or Wo Contrast  Result Date: 06/17/2017 CLINICAL DATA:  56 year old male code stroke presentation with left side weakness. EXAM: CT ANGIOGRAPHY HEAD AND NECK TECHNIQUE: Multidetector CT imaging of the head and neck was performed using the standard protocol during bolus administration of intravenous contrast. Multiplanar CT image reconstructions and MIPs were obtained to evaluate the vascular anatomy. Carotid stenosis measurements (when applicable) are obtained utilizing NASCET criteria, using the distal internal carotid diameter as the denominator. CONTRAST:  50 mL Isovue 370 COMPARISON:  Head CT without contrast 0057 hours today, and earlier. Brain MRI 04/01/2014. Chest CTA 06/27/2016. FINDINGS: CTA NECK Skeleton: No acute osseous abnormality identified. Degenerative osteophytes and occasional interbody ankylosis in the spine. Upper chest: Mediastinal lipomatosis. Enlarged central pulmonary artery (estimated at 43  mm diameter versus adjacent ascending aorta of 41 mm). Atelectatic changes to the trachea. Negative visible lung parenchyma. No superior mediastinal lymphadenopathy. Other neck: Negative.  No cervical lymphadenopathy. Aortic arch: Suboptimal contrast bolus, with significant reflux of venous contrast into both internal jugular veins, and left chest wall venous collaterals. There is no significant central venous stenosis visible on these images. Three vessel arch configuration with minimal arch atherosclerosis. Right carotid system: Mild soft plaque in the right CCA without stenosis. Soft and calcified plaque at the right carotid bifurcation and posterior  right ICA origin and bulb with less than 50 % stenosis with respect to the distal vessel. Left carotid system: The left CCA at the level of the hypopharynx is obscured by dense streak artifact from contrast reflux into the left IJ. The visible left CCA is patent without stenosis. Left carotid bifurcation is patent without stenosis. Partially retropharyngeal course of the left ICA in the neck. Vertebral arteries: No proximal right subclavian artery stenosis. Right vertebral artery origin appears normal. Suboptimal proximal V 2 contrast, but no right vertebral artery stenosis identified in the neck. No proximal left subclavian artery stenosis. Suboptimal proximal left vertebral artery contrast, but the left vertebral artery origin and proximal vessel appear grossly normal. The distal left vertebral artery is patent without stenosis. CTA HEAD Posterior circulation: Patent distal vertebral arteries and vertebrobasilar junction with atherosclerosis but no definite hemodynamically significant stenosis. Patent basilar artery without stenosis. SCA and PCA origins are patent. Posterior communicating arteries are diminutive or absent. Bilateral PCA branches are regular but patent. Anterior circulation: Both ICA siphons are patent. Moderate left and mild right ICA siphon  calcified plaque. No significant siphon stenosis. Patent carotid termini. Patent MCA and ACA origins. Both MCA M1 segments are patent but irregular compatible with atherosclerosis. Allowing for the suboptimal contrast bolus timing, no left MCA branch occlusion is identified. Dominant right ACA A1 segment. Anterior communicating artery and bilateral ACA branches are patent with mild to moderate A2 segment irregularity. Venous sinuses: Early contrast timing, not evaluated. Anatomic variants: Dominant right A1 segment. Review of the MIP images confirms the above findings IMPRESSION: 1. Suboptimal arterial contrast timing. Negative for large vessel occlusion. This was preliminarily discussed by telephone with Dr. Kerney Elbe on 06/17/2017 at 0118 hours. 2. Widespread intracranial atherosclerosis, including involvement of the bilateral M1 segments. No MCA branch occlusion identified. 3. No hemodynamically significant stenosis identified in the neck. 4. Chronically enlarged main pulmonary artery suggesting pulmonary artery hypertension. Electronically Signed   By: Genevie Ann M.D.   On: 06/17/2017 01:33   Ct Head Code Stroke Wo Contrast  Result Date: 06/17/2017 CLINICAL DATA:  Code stroke. 56 year old male with left side weakness. Last seen normal 0015 hours. EXAM: CT HEAD WITHOUT CONTRAST TECHNIQUE: Contiguous axial images were obtained from the base of the skull through the vertex without intravenous contrast. COMPARISON:  05/05/2017 and earlier. FINDINGS: Brain: Cerebral volume remains normal. No midline shift, ventriculomegaly, mass effect, evidence of mass lesion, intracranial hemorrhage or evidence of cortically based acute infarction. Gray-white matter differentiation is within normal limits throughout the brain. Vascular: Calcified atherosclerosis at the skull base. No suspicious intracranial vascular hyperdensity. Skull: Stable.  No acute osseous abnormality identified. Sinuses/Orbits: Visualized paranasal  sinuses and mastoids are stable and well pneumatized. Other: No acute orbit or scalp soft tissue finding. ASPECTS ([place] Stroke Program Early CT Score) - Ganglionic level infarction (caudate, lentiform nuclei, internal capsule, insula, M1-M3 cortex): 7 - Supraganglionic infarction (M4-M6 cortex): 3 Total score (0-10 with 10 being normal): 10 IMPRESSION: 1. Stable and normal noncontrast CT appearance of the brain. 2. ASPECTS is 10. 3. The above was relayed via text pager to Dr. Bruce Donath On 06/17/2017 at 01:07 . Electronically Signed   By: Genevie Ann M.D.   On: 06/17/2017 01:07    EKG: Independently reviewed.  Assessment/Plan Principal Problem:   Cerebral embolism with cerebral infarction Active Problems:   HTN (hypertension)   Type 2 diabetes mellitus with vascular disease (HCC)   TIA (transient ischemic attack)    1.  Stroke vs TIA - 1. Stroke pathway 2. MRI brain 3. 2D echo 4. Tele monitor 5. PT/OT/SLP 6. Continue ASA/plavix and lipitor 7. Lipid profile and A1C 2. HTN - Per Dr. Cheral Marker, treat if BP > 180 1. Will continue home coreg 2. Will hold all his other home BP meds (amlodipine, PO hydralazine, lasix, isordil) 3. And use PRN hydralazine IV as needed for BP 3. DM2 - 1. Hold home PO hypoglycemics 2. Mod scale SSI AC  DVT prophylaxis: Lovenox Code Status: Full Family Communication: No family in room Disposition Plan: Home after admit Consults called: Neuro Admission status: Place in obs   Shaquille Murdy, [facility] Pager [phone]  If 7AM-7PM, please contact day team taking care of patient [email or web address] Password [email or web address]  06/17/2017, 5:13 AM

## 2017-06-17 NOTE — ED Notes (Signed)
Attempted report 

## 2017-06-17 NOTE — Progress Notes (Signed)
Patient seen and examined while in ED. Admitted after midnight secondary to left arm numbness and associated HA, dizziness, blurred vision and aphasia.  No CP, no SOB. Reporting mild HA. Hemodynamically stable. While in ED CT scan w/o acute intracranial abnormalities. Patient admitted for further work up and evaluation of most likely TIA vs Stroke. Please see H&P written by Dr. Alcario Drought for further info/details on admission.   Barton Dubois MD [phone]

## 2017-06-17 NOTE — ED Notes (Signed)
Pt arrives via EMS as a code stroke LSW 0010. Sudden onset numbness L arm and shoulder, 5 minutes later pt began to have headache, dizziness, blurred vision, intermittent dysphagia/asphasia.

## 2017-06-17 NOTE — Code Documentation (Signed)
Responded to Code Stroke called at [place].  Pt arrived at [place].  Pt c/o L arm numbness, dizziness, headache, SOB, slurred speech, and blurry vision.  [id number]. CBG-244. NIH-2 for sensory and mild dysarthria.  CT negative for acute findings. Code stroke cancelled at [place].

## 2017-06-17 NOTE — Consult Note (Signed)
Referring Physician: Dr. Leonides Schanz    Chief Complaint: Acute onset of left arm and shoulder numbness  HPI: CAIDON FOTI is an 56 y.o. male who presented via EMS as a Code Stroke for sudden onset of left arm and shoulder numbness at 0010. 5 minutes later the patient began to have headache, dizziness, blurred vision and intermittent dysphagia/aphasia. CBG was 244. The patient arrived to the ED at 0050 and continued to complain of the above symptoms, but felt that they were improving.   Emergent CT head was negative for acute abnormality. CTA of head and neck showed no LVO.   His PMHx includes CAD, DVT, IBS, OSA, PE, PUD, HLD, HTN, DM2, renal disorder and stroke. He was recently seen in the ED on 8/20 for sudden onset of bloody emesis x 3.   Home medications include ASA, Plavix and Lipitor.   LSN: [id number] tPA Given: No: NIHSS of 1 with rapidly resolving symptoms.   Past Medical History:  Diagnosis Date  . Arthritis   . Back pain   . CAD (coronary artery disease)    a. s/p DES to LAD in 05/2016  . DVT (deep venous thrombosis) ([place])   . Hyperlipidemia   . Hypertension   . IBS (irritable bowel syndrome)   . OSA (obstructive sleep apnea)   . Pancreatitis   . PE (pulmonary thromboembolism) ([place])   . PUD (peptic ulcer disease)   . Renal disorder   . Stroke ([place])   . Type 2 diabetes mellitus ([place])     Past Surgical History:  Procedure Laterality Date  . CARDIAC CATHETERIZATION N/A 05/31/2016   Procedure: Left Heart Cath and Coronary Angiography;  Surgeon: Peter M Martinique, MD;  Location: [facility];  Service: Cardiovascular;  Laterality: N/A;  . CARDIAC CATHETERIZATION N/A 05/31/2016   Procedure: Intravascular Pressure Wire/FFR Study;  Surgeon: Peter M Martinique, MD;  Location: [facility];  Service: Cardiovascular;  Laterality: N/A;  . CARDIAC CATHETERIZATION N/A 05/31/2016   Procedure: Coronary Stent Intervention;  Surgeon: Peter M Martinique, MD;  Location: [facility];  Service:  Cardiovascular;  Laterality: N/A;  . LEFT HEART CATHETERIZATION WITH CORONARY ANGIOGRAM N/A 02/03/2014   Procedure: LEFT HEART CATHETERIZATION WITH CORONARY ANGIOGRAM;  Surgeon: Pixie Casino, MD;  Location: [facility];  Service: Cardiovascular;  Laterality: N/A;  . left leg stent       Family History  Problem Relation Age of Onset  . Cancer Father   . Hypertension Mother   . Diabetes Mother   . Breast cancer Mother   . Hypertension Brother   . Diabetes Brother   . Hypertension Sister   . Diabetes Sister    Social History:  reports that he has never smoked. He has never used smokeless tobacco. He reports that he does not drink alcohol or use drugs.  Allergies:  Allergies  Allergen Reactions  . Coconut Flavor [Flavoring Agent] Hives  . Coconut Oil Hives  . Ibuprofen Other (See Comments)    Made gastric ulcers worse    Home Medications:    ROS: Positive for SOB, vertigo, right sided headache, visual blurring, and left upper extremity sensory numbness. Other ROS as per HPI.   Physical Examination: Blood pressure (!) 147/67, pulse 72, temperature 98.7 F (37.1 C), temperature source Oral, resp. rate 18, weight (!) 147.4 kg (324 lb 15.3 oz), SpO2 93 %.  HEENT: [place]/AT Lungs: Respirations unlabored Ext: Warm and well perfused  Neurologic Examination: Mental Status: Alert,  oriented, thought content appropriate.  In the context of lingual dysarthria, speech is fluent with intact comprehension, naming and repetition. Able to follow all commands without difficulty. Cranial Nerves: II:  Visual fields intact. PERRL. III,IV, VI: Ptosis not present, EOMI without nystagmus V,VII: No facial droop. Facial temp sensation normal bilaterally VIII: hearing intact to voice IX,X: No hypophonia XI: Symmetric XII: midline tongue extension  Motor: Right : Upper extremity   5/5    Left:     Upper extremity   5/5  Lower extremity   4/5 at hip, otherwise 5/5  Lower extremity   4/5 at hip,  otherwise 5/5 Normal tone throughout; no atrophy noted No drift. Sensory: Decreased temperature sensation to LUE, otherwise intact. No extinction.  Deep Tendon Reflexes:  Normoactive x 4 without asymmetry  Plantars: Right: downgoing  Left: downgoing Cerebellar: No ataxia with FNF bilaterally Gait: Deferred   Results for orders placed or performed during the hospital encounter of 06/17/17 (from the past 48 hour(s))  CBG monitoring, ED     Status: Abnormal   Collection Time: 06/17/17 12:49 AM  Result Value Ref Range   Glucose-Capillary 245 (H) 65 - 99 mg/dL  Protime-INR     Status: None   Collection Time: 06/17/17 12:51 AM  Result Value Ref Range   Prothrombin Time 12.5 11.4 - 15.2 seconds   INR 0.93   APTT     Status: None   Collection Time: 06/17/17 12:51 AM  Result Value Ref Range   aPTT 27 24 - 36 seconds  CBC     Status: Abnormal   Collection Time: 06/17/17 12:51 AM  Result Value Ref Range   WBC 10.1 4.0 - 10.5 K/uL   RBC 3.99 (L) 4.22 - 5.81 MIL/uL   Hemoglobin 11.2 (L) 13.0 - 17.0 g/dL   HCT 35.5 (L) 39.0 - 52.0 %   MCV 89.0 78.0 - 100.0 fL   MCH 28.1 26.0 - 34.0 pg   MCHC 31.5 30.0 - 36.0 g/dL   RDW 14.5 11.5 - 15.5 %   Platelets 204 150 - 400 K/uL  Differential     Status: None   Collection Time: 06/17/17 12:51 AM  Result Value Ref Range   Neutrophils Relative % 66 %   Neutro Abs 6.6 1.7 - 7.7 K/uL   Lymphocytes Relative 26 %   Lymphs Abs 2.6 0.7 - 4.0 K/uL   Monocytes Relative 7 %   Monocytes Absolute 0.7 0.1 - 1.0 K/uL   Eosinophils Relative 1 %   Eosinophils Absolute 0.1 0.0 - 0.7 K/uL   Basophils Relative 0 %   Basophils Absolute 0.0 0.0 - 0.1 K/uL  Comprehensive metabolic panel     Status: Abnormal   Collection Time: 06/17/17 12:51 AM  Result Value Ref Range   Sodium 136 135 - 145 mmol/L   Potassium 3.4 (L) 3.5 - 5.1 mmol/L   Chloride 98 (L) 101 - 111 mmol/L   CO2 25 22 - 32 mmol/L   Glucose, Bld 249 (H) 65 - 99 mg/dL   BUN 10 6 - 20 mg/dL    Creatinine, Ser 0.72 0.61 - 1.24 mg/dL   Calcium 8.8 (L) 8.9 - 10.3 mg/dL   Total Protein 7.1 6.5 - 8.1 g/dL   Albumin 3.6 3.5 - 5.0 g/dL   AST 31 15 - 41 U/L   ALT 28 17 - 63 U/L   Alkaline Phosphatase 37 (L) 38 - 126 U/L   Total Bilirubin 0.6 0.3 - 1.2 mg/dL  GFR calc non Af Amer >60 >60 mL/min   GFR calc Af Amer >60 >60 mL/min    Comment: (NOTE) The eGFR has been calculated using the CKD EPI equation. This calculation has not been validated in all clinical situations. eGFR's persistently <60 mL/min signify possible Chronic Kidney Disease.    Anion gap 13 5 - 15  I-stat troponin, ED     Status: None   Collection Time: 06/17/17 12:56 AM  Result Value Ref Range   Troponin i, poc 0.00 0.00 - 0.08 ng/mL   Comment 3            Comment: Due to the release kinetics of cTnI, a negative result within the first hours of the onset of symptoms does not rule out myocardial infarction with certainty. If myocardial infarction is still suspected, repeat the test at appropriate intervals.   I-Stat Chem 8, ED     Status: Abnormal   Collection Time: 06/17/17  1:00 AM  Result Value Ref Range   Sodium 136 135 - 145 mmol/L   Potassium 3.4 (L) 3.5 - 5.1 mmol/L   Chloride 99 (L) 101 - 111 mmol/L   BUN 11 6 - 20 mg/dL   Creatinine, Ser 0.50 (L) 0.61 - 1.24 mg/dL   Glucose, Bld 249 (H) 65 - 99 mg/dL   Calcium, Ion 0.88 (LL) 1.15 - 1.40 mmol/L   TCO2 25 22 - 32 mmol/L   Hemoglobin 11.6 (L) 13.0 - 17.0 g/dL   HCT 34.0 (L) 39.0 - 52.0 %  Urinalysis, Routine w reflex microscopic     Status: Abnormal   Collection Time: 06/17/17  1:31 AM  Result Value Ref Range   Color, Urine COLORLESS (A) YELLOW   APPearance CLEAR CLEAR   Specific Gravity, Urine 1.002 (L) 1.005 - 1.030   pH 5.0 5.0 - 8.0   Glucose, UA >=500 (A) NEGATIVE mg/dL   Hgb urine dipstick NEGATIVE NEGATIVE   Bilirubin Urine NEGATIVE NEGATIVE   Ketones, ur NEGATIVE NEGATIVE mg/dL   Protein, ur NEGATIVE NEGATIVE mg/dL   Nitrite  NEGATIVE NEGATIVE   Leukocytes, UA NEGATIVE NEGATIVE   RBC / HPF 0-5 0 - 5 RBC/hpf   WBC, UA 0-5 0 - 5 WBC/hpf   Bacteria, UA NONE SEEN NONE SEEN   Squamous Epithelial / LPF NONE SEEN NONE SEEN  Rapid urine drug screen (hospital performed)     Status: None   Collection Time: 06/17/17  1:32 AM  Result Value Ref Range   Opiates NONE DETECTED NONE DETECTED   Cocaine NONE DETECTED NONE DETECTED   Benzodiazepines NONE DETECTED NONE DETECTED   Amphetamines NONE DETECTED NONE DETECTED   Tetrahydrocannabinol NONE DETECTED NONE DETECTED   Barbiturates NONE DETECTED NONE DETECTED    Comment:        DRUG SCREEN FOR MEDICAL PURPOSES ONLY.  IF CONFIRMATION IS NEEDED FOR ANY PURPOSE, NOTIFY LAB WITHIN 5 DAYS.        LOWEST DETECTABLE LIMITS FOR URINE DRUG SCREEN Drug Class       Cutoff (ng/mL) Amphetamine      1000 Barbiturate      200 Benzodiazepine   237 Tricyclics       628 Opiates          300 Cocaine          300 THC              50    Ct Angio Head W Or Wo Contrast  Result Date: 06/17/2017 CLINICAL  DATA:  55 year old male code stroke presentation with left side weakness. EXAM: CT ANGIOGRAPHY HEAD AND NECK TECHNIQUE: Multidetector CT imaging of the head and neck was performed using the standard protocol during bolus administration of intravenous contrast. Multiplanar CT image reconstructions and MIPs were obtained to evaluate the vascular anatomy. Carotid stenosis measurements (when applicable) are obtained utilizing NASCET criteria, using the distal internal carotid diameter as the denominator. CONTRAST:  50 mL Isovue 370 COMPARISON:  Head CT without contrast 0057 hours today, and earlier. Brain MRI 04/01/2014. Chest CTA 06/27/2016. FINDINGS: CTA NECK Skeleton: No acute osseous abnormality identified. Degenerative osteophytes and occasional interbody ankylosis in the spine. Upper chest: Mediastinal lipomatosis. Enlarged central pulmonary artery (estimated at 43 mm diameter versus adjacent  ascending aorta of 41 mm). Atelectatic changes to the trachea. Negative visible lung parenchyma. No superior mediastinal lymphadenopathy. Other neck: Negative.  No cervical lymphadenopathy. Aortic arch: Suboptimal contrast bolus, with significant reflux of venous contrast into both internal jugular veins, and left chest wall venous collaterals. There is no significant central venous stenosis visible on these images. Three vessel arch configuration with minimal arch atherosclerosis. Right carotid system: Mild soft plaque in the right CCA without stenosis. Soft and calcified plaque at the right carotid bifurcation and posterior right ICA origin and bulb with less than 50 % stenosis with respect to the distal vessel. Left carotid system: The left CCA at the level of the hypopharynx is obscured by dense streak artifact from contrast reflux into the left IJ. The visible left CCA is patent without stenosis. Left carotid bifurcation is patent without stenosis. Partially retropharyngeal course of the left ICA in the neck. Vertebral arteries: No proximal right subclavian artery stenosis. Right vertebral artery origin appears normal. Suboptimal proximal V 2 contrast, but no right vertebral artery stenosis identified in the neck. No proximal left subclavian artery stenosis. Suboptimal proximal left vertebral artery contrast, but the left vertebral artery origin and proximal vessel appear grossly normal. The distal left vertebral artery is patent without stenosis. CTA HEAD Posterior circulation: Patent distal vertebral arteries and vertebrobasilar junction with atherosclerosis but no definite hemodynamically significant stenosis. Patent basilar artery without stenosis. SCA and PCA origins are patent. Posterior communicating arteries are diminutive or absent. Bilateral PCA branches are regular but patent. Anterior circulation: Both ICA siphons are patent. Moderate left and mild right ICA siphon calcified plaque. No significant  siphon stenosis. Patent carotid termini. Patent MCA and ACA origins. Both MCA M1 segments are patent but irregular compatible with atherosclerosis. Allowing for the suboptimal contrast bolus timing, no left MCA branch occlusion is identified. Dominant right ACA A1 segment. Anterior communicating artery and bilateral ACA branches are patent with mild to moderate A2 segment irregularity. Venous sinuses: Early contrast timing, not evaluated. Anatomic variants: Dominant right A1 segment. Review of the MIP images confirms the above findings IMPRESSION: 1. Suboptimal arterial contrast timing. Negative for large vessel occlusion. This was preliminarily discussed by telephone with Dr. Kerney Elbe on 06/17/2017 at 0118 hours. 2. Widespread intracranial atherosclerosis, including involvement of the bilateral M1 segments. No MCA branch occlusion identified. 3. No hemodynamically significant stenosis identified in the neck. 4. Chronically enlarged main pulmonary artery suggesting pulmonary artery hypertension. Electronically Signed   By: Genevie Ann M.D.   On: 06/17/2017 01:33   Ct Angio Neck W Or Wo Contrast  Result Date: 06/17/2017 CLINICAL DATA:  56 year old male code stroke presentation with left side weakness. EXAM: CT ANGIOGRAPHY HEAD AND NECK TECHNIQUE: Multidetector CT imaging of the head and  neck was performed using the standard protocol during bolus administration of intravenous contrast. Multiplanar CT image reconstructions and MIPs were obtained to evaluate the vascular anatomy. Carotid stenosis measurements (when applicable) are obtained utilizing NASCET criteria, using the distal internal carotid diameter as the denominator. CONTRAST:  50 mL Isovue 370 COMPARISON:  Head CT without contrast 0057 hours today, and earlier. Brain MRI 04/01/2014. Chest CTA 06/27/2016. FINDINGS: CTA NECK Skeleton: No acute osseous abnormality identified. Degenerative osteophytes and occasional interbody ankylosis in the spine. Upper  chest: Mediastinal lipomatosis. Enlarged central pulmonary artery (estimated at 43 mm diameter versus adjacent ascending aorta of 41 mm). Atelectatic changes to the trachea. Negative visible lung parenchyma. No superior mediastinal lymphadenopathy. Other neck: Negative.  No cervical lymphadenopathy. Aortic arch: Suboptimal contrast bolus, with significant reflux of venous contrast into both internal jugular veins, and left chest wall venous collaterals. There is no significant central venous stenosis visible on these images. Three vessel arch configuration with minimal arch atherosclerosis. Right carotid system: Mild soft plaque in the right CCA without stenosis. Soft and calcified plaque at the right carotid bifurcation and posterior right ICA origin and bulb with less than 50 % stenosis with respect to the distal vessel. Left carotid system: The left CCA at the level of the hypopharynx is obscured by dense streak artifact from contrast reflux into the left IJ. The visible left CCA is patent without stenosis. Left carotid bifurcation is patent without stenosis. Partially retropharyngeal course of the left ICA in the neck. Vertebral arteries: No proximal right subclavian artery stenosis. Right vertebral artery origin appears normal. Suboptimal proximal V 2 contrast, but no right vertebral artery stenosis identified in the neck. No proximal left subclavian artery stenosis. Suboptimal proximal left vertebral artery contrast, but the left vertebral artery origin and proximal vessel appear grossly normal. The distal left vertebral artery is patent without stenosis. CTA HEAD Posterior circulation: Patent distal vertebral arteries and vertebrobasilar junction with atherosclerosis but no definite hemodynamically significant stenosis. Patent basilar artery without stenosis. SCA and PCA origins are patent. Posterior communicating arteries are diminutive or absent. Bilateral PCA branches are regular but patent. Anterior  circulation: Both ICA siphons are patent. Moderate left and mild right ICA siphon calcified plaque. No significant siphon stenosis. Patent carotid termini. Patent MCA and ACA origins. Both MCA M1 segments are patent but irregular compatible with atherosclerosis. Allowing for the suboptimal contrast bolus timing, no left MCA branch occlusion is identified. Dominant right ACA A1 segment. Anterior communicating artery and bilateral ACA branches are patent with mild to moderate A2 segment irregularity. Venous sinuses: Early contrast timing, not evaluated. Anatomic variants: Dominant right A1 segment. Review of the MIP images confirms the above findings IMPRESSION: 1. Suboptimal arterial contrast timing. Negative for large vessel occlusion. This was preliminarily discussed by telephone with Dr. Kerney Elbe on 06/17/2017 at 0118 hours. 2. Widespread intracranial atherosclerosis, including involvement of the bilateral M1 segments. No MCA branch occlusion identified. 3. No hemodynamically significant stenosis identified in the neck. 4. Chronically enlarged main pulmonary artery suggesting pulmonary artery hypertension. Electronically Signed   By: Genevie Ann M.D.   On: 06/17/2017 01:33   Ct Head Code Stroke Wo Contrast  Result Date: 06/17/2017 CLINICAL DATA:  Code stroke. 56 year old male with left side weakness. Last seen normal 0015 hours. EXAM: CT HEAD WITHOUT CONTRAST TECHNIQUE: Contiguous axial images were obtained from the base of the skull through the vertex without intravenous contrast. COMPARISON:  05/05/2017 and earlier. FINDINGS: Brain: Cerebral volume remains normal. No midline  shift, ventriculomegaly, mass effect, evidence of mass lesion, intracranial hemorrhage or evidence of cortically based acute infarction. Gray-white matter differentiation is within normal limits throughout the brain. Vascular: Calcified atherosclerosis at the skull base. No suspicious intracranial vascular hyperdensity. Skull: Stable.  No  acute osseous abnormality identified. Sinuses/Orbits: Visualized paranasal sinuses and mastoids are stable and well pneumatized. Other: No acute orbit or scalp soft tissue finding. ASPECTS ([place] Stroke Program Early CT Score) - Ganglionic level infarction (caudate, lentiform nuclei, internal capsule, insula, M1-M3 cortex): 7 - Supraganglionic infarction (M4-M6 cortex): 3 Total score (0-10 with 10 being normal): 10 IMPRESSION: 1. Stable and normal noncontrast CT appearance of the brain. 2. ASPECTS is 10. 3. The above was relayed via text pager to Dr. Bruce Donath On 06/17/2017 at 01:07 . Electronically Signed   By: Genevie Ann M.D.   On: 06/17/2017 01:07    Assessment: 56 y.o. male with acute onset of left arm and shoulder numbness, dysarthria, dysphagia, headache and blurred vision 1. Exam shows mild sensory loss to LUE. IV tPA not indicated as risks of hemorrhage outweigh limited benefit in the setting of NIHSS of 1 and rapidly improving symptoms.  2. CT head is normal. 3. CTA head and neck is negative for large vessel occlusion. There is widespread intracranial atherosclerosis, including involvement of the bilateral M1 segments. No MCA branch occlusion identified. No hemodynamically significant stenosis identified in the neck.  4. Stroke Risk Factors - CAD, history of DVT, OSA, HLD, HTN, DM2 and prior stroke 5. Also seen on CTA is a chronically enlarged main pulmonary artery, suggesting pulmonary artery hypertension  Plan: 1. HgbA1c, fasting lipid panel 2. MRI of the brain without contrast 3. PT consult, OT consult, Speech consult 4. Echocardiogram 5. Continue ASA, Plavix and Lipitor 6. Risk factor modification to include weight loss with diet and non-strenuous aerobic exercise 7. Telemetry monitoring 8. Frequent neuro checks 9. Modified permissive HTN protocol: Treat BP if SBP is > 180. He has evidence for pulmonary HTN on CTA as well as CAD which would put him at risk for cardiac complications  with higher blood pressures  '@Electronically'  signed: Dr. Kerney Elbe  06/17/2017, 2:23 AM

## 2017-06-17 NOTE — Progress Notes (Signed)
PT Cancellation Note  Patient Details Name: [name] MRN: [id number] DOB: [birth year]   Cancelled Treatment:    Reason Eval/Treat Not Completed: Patient at procedure or test/unavailable   Duncan Dull 06/17/2017, 9:25 AM

## 2017-06-17 NOTE — ED Notes (Signed)
Per [place] cancel code stroke

## 2017-06-18 ENCOUNTER — Encounter (HOSPITAL_COMMUNITY): Payer: Self-pay | Admitting: Surgery

## 2017-06-18 DIAGNOSIS — I10 Essential (primary) hypertension: Secondary | ICD-10-CM | POA: Diagnosis not present

## 2017-06-18 DIAGNOSIS — R200 Anesthesia of skin: Secondary | ICD-10-CM | POA: Diagnosis not present

## 2017-06-18 DIAGNOSIS — E876 Hypokalemia: Secondary | ICD-10-CM

## 2017-06-18 DIAGNOSIS — E1159 Type 2 diabetes mellitus with other circulatory complications: Secondary | ICD-10-CM | POA: Diagnosis not present

## 2017-06-18 LAB — GLUCOSE, CAPILLARY
Glucose-Capillary: 247 mg/dL — ABNORMAL HIGH (ref 65–99)
Glucose-Capillary: 286 mg/dL — ABNORMAL HIGH (ref 65–99)
Glucose-Capillary: 261 mg/dL — ABNORMAL HIGH (ref 65–99)

## 2017-06-18 LAB — LIPID PANEL
Cholesterol: 81 mg/dL (ref 0–200)
HDL: 28 mg/dL — ABNORMAL LOW (ref >40)
LDL Cholesterol: 2 mg/dL (ref 0–99)
Total CHOL/HDL Ratio: 2.9 RATIO
Triglycerides: 255 mg/dL — ABNORMAL HIGH (ref <150)
VLDL: 51 mg/dL — ABNORMAL HIGH (ref 0–40)

## 2017-06-18 LAB — HEMOGLOBIN A1C
Hgb A1c MFr Bld: 9.7 % — ABNORMAL HIGH (ref 4.8–5.6)
Mean Plasma Glucose: 231.69 mg/dL

## 2017-06-18 MED ORDER — FAMOTIDINE 40 MG PO TABS: 40.0000 mg | ORAL_TABLET | Freq: Every day | ORAL | 2 refills | Status: DC

## 2017-06-18 MED ORDER — PANTOPRAZOLE SODIUM 40 MG PO TBEC
40.0000 mg | DELAYED_RELEASE_TABLET | Freq: Two times a day (BID) | ORAL | 1 refills | Status: DC
Start: 2017-06-18 — End: 2019-02-26

## 2017-06-18 MED ORDER — INSULIN PEN NEEDLE 31G X 5 MM MISC: 2 refills | Status: AC

## 2017-06-18 MED ORDER — FISH OIL CONCENTRATE 1000 MG PO CAPS: 2000.0000 mg | ORAL_CAPSULE | Freq: Every day | ORAL | 2 refills | Status: DC

## 2017-06-18 MED ORDER — INSULIN DETEMIR 100 UNIT/ML FLEXPEN: 20.0000 [IU] | PEN_INJECTOR | Freq: Every day | SUBCUTANEOUS | 3 refills | Status: DC

## 2017-06-18 NOTE — Progress Notes (Signed)
STROKE TEAM PROGRESS NOTE   HISTORY OF PRESENT ILLNESS (per record) [name] is an 56 y.o. male who presented via EMS as a Code Stroke for sudden onset of left arm and shoulder numbness at 0010. 5 minutes later the patient began to have headache, dizziness, blurred vision and intermittent dysphagia/aphasia. CBG was 244. The patient arrived to the ED at 0050 and continued to complain of the above symptoms, but felt that they were improving.   Emergent CT head was negative for acute abnormality. CTA of head and neck showed no LVO.   His PMHx includes CAD, DVT, IBS, OSA, PE, PUD, HLD, HTN, DM2, renal disorder and stroke. He was recently seen in the ED on 8/20 for sudden onset of bloody emesis x 3.   Home medications include ASA, Plavix and Lipitor.   LSN: [id number] tPA Given: No: NIHSS of 1 with rapidly resolving symptoms.    SUBJECTIVE (INTERVAL HISTORY) His RNis at the bedside.  He has no complaints. He has h/o spinal stenosis and c/o chronic neck and left shoulder pain  OBJECTIVE Temp:  [97.7 F (36.5 C)-98.6 F (37 C)] 97.7 F (36.5 C) (08/26 1400) Pulse Rate:  [66-69] 66 (08/26 1400) Cardiac Rhythm: Normal sinus rhythm (08/26 0710) Resp:  [18-20] 20 (08/26 1400) BP: (155-165)/(80-82) 165/82 (08/26 1400) SpO2:  [94 %-97 %] 94 % (08/26 1400)  CBC:   Recent Labs Lab 06/17/17 0051 06/17/17 0100  WBC 10.1  --   NEUTROABS 6.6  --   HGB 11.2* 11.6*  HCT 35.5* 34.0*  MCV 89.0  --   PLT 204  --     Basic Metabolic Panel:   Recent Labs Lab 06/17/17 0051 06/17/17 0100  NA 136 136  K 3.4* 3.4*  CL 98* 99*  CO2 25  --   GLUCOSE 249* 249*  BUN 10 11  CREATININE 0.72 0.50*  CALCIUM 8.8*  --     Lipid Panel:     Component Value Date/Time   CHOL 81 06/18/2017 0558   TRIG 255 (H) 06/18/2017 0558   HDL 28 (L) 06/18/2017 0558   CHOLHDL 2.9 06/18/2017 0558   VLDL 51 (H) 06/18/2017 0558   LDLCALC 2 06/18/2017 0558   HgbA1c:  Lab Results  Component Value Date    HGBA1C 9.7 (H) 06/18/2017   Urine Drug Screen:     Component Value Date/Time   LABOPIA NONE DETECTED 06/17/2017 0132   COCAINSCRNUR NONE DETECTED 06/17/2017 0132   LABBENZ NONE DETECTED 06/17/2017 0132   AMPHETMU NONE DETECTED 06/17/2017 0132   THCU NONE DETECTED 06/17/2017 0132   LABBARB NONE DETECTED 06/17/2017 0132    Alcohol Level     Component Value Date/Time   ETH <5 01/22/2017 1225    IMAGING   Ct Angio Head W Or Wo Contrast Ct Angio Neck W Or Wo Contrast 06/17/2017 1. Suboptimal arterial contrast timing. Negative for large vessel occlusion.  2. Widespread intracranial atherosclerosis, including involvement of the bilateral M1 segments. No MCA branch occlusion identified.  3. No hemodynamically significant stenosis identified in the neck.  4. Chronically enlarged main pulmonary artery suggesting pulmonary artery hypertension.     Mr Brain Wo Contrast 06/17/2017 Prematurely truncated exam demonstrating no definite acute or focal intracranial findings.      Ct Head Code Stroke Wo Contrast 06/17/2017 1. Stable and normal noncontrast CT appearance of the brain.  2. ASPECTS is 10.     Transthoracic Echocardiogram - pending     PHYSICAL EXAM Vitals:  06/17/17 1400 06/17/17 2219 06/18/17 0710 06/18/17 1400  BP: 128/66 (!) 155/80  (!) 165/82  Pulse: 71 69  66  Resp: 18 18  20   Temp: 97.6 F (36.4 C) 98.6 F (37 C)  97.7 F (36.5 C)  TempSrc: Oral Oral  Oral  SpO2: 94% 97%  94%  Weight:      Height:   5\' 10"  (1.778 m)    obese middle aged african [place] male not in distress. . Afebrile. Head is nontraumatic. Neck is supple without bruit.    Cardiac exam no murmur or gallop. Lungs are clear to auscultation. Distal pulses are well felt. Neurological Exam ;  Awake  Alert oriented x 3. Normal speech and language.eye movements full without nystagmus.fundi were not visualized. Vision acuity and fields appear normal. Hearing is normal. Palatal movements  are normal. Face symmetric. Tongue midline. Normal strength, tone, reflexes and coordination. Normal sensation. Gait deferred.   ASSESSMENT/PLAN [name] is a 56 y.o. male with history of coronary artery disease, DVT, IBS, obstructive sleep apnea, pulmonary embolus, peptic ulcer disease, hyperlipidemia, hypertension, diabetes mellitus, renal disorder, previous stroke, and recent bloody emesis presenting with left arm and shoulder numbness, headache, dizziness, blurred vision, intermittent dysphagia/aphasia and an elevated glucose of 244. He did not receive IV t-PA due to rapidly resolving deficits.  Possible TIA: versus cervical radiculopathy from spinal stenosis patient refusing MRI C Spine a sprefers open scanner  Resultant  No deficits  CT head - normal  MRI head - limited exam with no significant findings.  MRA head - not performed  Carotid Doppler - CTA neck  CTA H&N - negative for large vessel occlusion.   2D Echo - pending  LDL - 2  HgbA1c - 9.7  VTE prophylaxis - Lovenox Diet Carb Modified Fluid consistency: Thin; Room service appropriate? Yes Diet - low sodium heart healthy Diet Carb Modified  aspirin 81 mg daily and clopidogrel 75 mg daily prior to admission, now on aspirin 81 mg daily and clopidogrel 75 mg daily  Patient counseled to be compliant with his antithrombotic medications  Ongoing aggressive stroke risk factor management  Therapy recommendations: Outpatient physical therapy. No follow-up OT recommended.  Disposition: Pending  Hypertension  Stable  Permissive hypertension (OK if < 220/120) but gradually normalize in 5-7 days  Long-term BP goal normotensive  Hyperlipidemia  Home meds:  Lipitor 80 mg daily resumed in hospital  LDL pending, goal < 70  Continue statin at discharge  Diabetes  HgbA1c 9.7, goal < 7.0  Uncontrolled  Other Stroke Risk Factors  Obesity, Body mass index is 46.63 kg/m., recommend weight loss, diet  and exercise as appropriate   Hx stroke/TIA  Coronary artery disease  Obstructive sleep apnea  Other Active Problems  Mild anemia - 11.6 / 34  Hypokalemia - 3.4  Chronically enlarged main pulmonary artery suggesting pulmonary artery hypertension.   Hx of previous stroke, DVT and PE - ? hypercoagulable  Hospital day # 0  I have personally examined this patient, reviewed notes, independently viewed imaging studies, participated in medical decision making and plan of care.ROS completed by me personally and pertinent positives fully documented  I have made any additions or clarifications directly to the above note.  He presented with transient left upper extremity numbness of unclear etiology TIA versus so cervical radiculopathy. Continue dual antiplatelet therapy given history of cardiac disease and ongoing stroke evaluation. Patient is refusing to undergo MRI scan of the cervical spine due to claustrophobia  and prefers to d do this on outpatient basis on the open scanner. Discharge the patient home and arrange   For outpatient C Spine MRI on open scanner unit.   Discussed with Dr. Dyann Kief. Stroke team will sign off. Call for questions.  Antony Contras, MD  To contact Stroke Continuity provider, please refer to [email or web address] After hours, contact General Neurology

## 2017-06-18 NOTE — Progress Notes (Signed)
Patient states he does not want to wear a CPAP. He just wants to use oxygen. Placed machine in his room if he changes his mind. RT will continue to monitor.

## 2017-06-18 NOTE — Evaluation (Signed)
Physical Therapy Evaluation Patient Details Name: [name] MRN: [id number] DOB: 03/28/1961 Today's Date: 06/18/2017   History of Present Illness  Pt is a 56 yo male admitted through ED on 06/17/17 with L UE arm and shoulder numbness. MRI and CT are negative for acute infarct. Pt was admitted under observation. PMH significant for stroke, HTN, DM2, CAD and lumbar DDD and cervical stenosis.   Clinical Impression  Pt presents with the above diagnosis and below deficits for therapy evaluation. Prior to admission, pt was living alone in a third floor apartment and was completely independent with mobility. Pt now presents with Mod I to supervision for mobility due to increased low back pain and weakness in UE's. Pt will benefit from continued therapy follow-up at discharge in order to improve LE strength and assist with core strengthening prior to potential surgery. Pt will benefit from continued acute PT follow-up in order to address the below deficits including providing an HEP for lumbar exercises.     Follow Up Recommendations Outpatient PT    Equipment Recommendations  Rolling walker with 5" wheels;Other (comment) (4 wheeled RW and transfer bench)    Recommendations for Other Services       Precautions / Restrictions Precautions Precautions: None Restrictions Weight Bearing Restrictions: No      Mobility  Bed Mobility Overal bed mobility: Modified Independent                Transfers Overall transfer level: Modified independent               General transfer comment: able to stand from recliner with use of hands, no physical assistance.   Ambulation/Gait Ambulation/Gait assistance: Supervision Ambulation Distance (Feet): 500 Feet Assistive device: None Gait Pattern/deviations: Step-through pattern;Wide base of support Gait velocity: decreased Gait velocity interpretation: Below normal speed for age/gender General Gait Details: Pt with WBOS and mild to  moderate antalgia as gait distance increases. Pt has no LOB or significant instability noted.   Stairs            Wheelchair Mobility    Modified Rankin (Stroke Patients Only)       Balance Overall balance assessment: No apparent balance deficits (not formally assessed)                                           Pertinent Vitals/Pain Pain Assessment: 0-10 Pain Score: 8  Pain Location: back, neck and RUE Pain Descriptors / Indicators: Aching;Discomfort;Constant Pain Intervention(s): Monitored during session;Premedicated before session    [place] expects to be discharged to:: Private residence Living Arrangements: Alone Available Help at Discharge: Neighbor Type of Home: Apartment Home Access: Level entry;Elevator     Home Layout: One level Home Equipment: Cane - single point      Prior Function Level of Independence: Independent with assistive device(s)         Comments: uses  for mobility. Has diffiuclty donning compression stockings     Hand Dominance   Dominant Hand: Right    Extremity/Trunk Assessment   Upper Extremity Assessment Upper Extremity Assessment: Defer to OT evaluation    Lower Extremity Assessment Lower Extremity Assessment: Generalized weakness    Cervical / Trunk Assessment Cervical / Trunk Assessment: Normal  Communication   Communication: No difficulties  Cognition Arousal/Alertness: Awake/alert Behavior During Therapy: WFL for tasks assessed/performed Overall Cognitive Status: Within Functional Limits for  tasks assessed                                        General Comments      Exercises     Assessment/Plan    PT Assessment Patient needs continued PT services  PT Problem List Decreased strength;Decreased activity tolerance;Decreased mobility;Pain       PT Treatment Interventions DME instruction;Gait training;Functional mobility training;Therapeutic  activities;Therapeutic exercise;Balance training    PT Goals (Current goals can be found in the Care Plan section)  Acute Rehab PT Goals Patient Stated Goal: to have less pain and be able to move better PT Goal Formulation: With patient Time For Goal Achievement: 06/25/17 Potential to Achieve Goals: Good    Frequency Min 3X/week   Barriers to discharge        Co-evaluation               AM-PAC PT "6 Clicks" Daily Activity  Outcome Measure Difficulty turning over in bed (including adjusting bedclothes, sheets and blankets)?: None Difficulty moving from lying on back to sitting on the side of the bed? : None Difficulty sitting down on and standing up from a chair with arms (e.g., wheelchair, bedside commode, etc,.)?: A Little Help needed moving to and from a bed to chair (including a wheelchair)?: A Little Help needed walking in hospital room?: A Little Help needed climbing 3-5 steps with a railing? : A Little 6 Click Score: 20    End of Session Equipment Utilized During Treatment: Gait belt Activity Tolerance: Patient tolerated treatment well Patient left: in chair;with call bell/phone within reach Nurse Communication: Mobility status PT Visit Diagnosis: Pain;Difficulty in walking, not elsewhere classified (R26.2) Pain - Right/Left:  (cervical and lumbar) Pain - part of body:  (back)    Time: 1610-9604 PT Time Calculation (min) (ACUTE ONLY): 23 min   Charges:   PT Evaluation $PT Eval Moderate Complexity: 1 Mod PT Treatments $Gait Training: 8-22 mins   PT G Codes:   PT G-Codes **NOT FOR INPATIENT CLASS** Functional Assessment Tool Used: AM-PAC 6 Clicks Basic Mobility;Clinical judgement Functional Limitation: Mobility: Walking and moving around Mobility: Walking and Moving Around Current Status (V4098): At least 20 percent but less than 40 percent impaired, limited or restricted Mobility: Walking and Moving Around Goal Status [phone]): 0 percent impaired, limited  or restricted    Scheryl Marten PT, DPT  [phone]   Shanon Rosser 06/18/2017, 10:11 AM

## 2017-06-18 NOTE — Care Management Note (Signed)
Case Management Note  Patient Details  Name: [name] MRN: [id number] Date of Birth: [birth year]  Subjective/Objective:   56 y.o. M to be discharged with Rollator and transfer Bench to be delivered to room by [place].                  Action/Plan:CM will sign off for now but will be available should additional discharge needs arise or disposition change.    Expected Discharge Date:  06/18/17               Expected Discharge Plan:     In-House Referral:     Discharge planning Services  CM Consult  Post Acute Care Choice:  Durable Medical Equipment Choice offered to:     DME Arranged:  Other see comment, Walker rolling with seat DME Agency:  [place]:    [place] Agency:     Status of Service:  Completed, signed off  If discussed at [place] of Stay Meetings, dates discussed:    Additional Comments:  Delrae Sawyers, RN 06/18/2017, 2:37 PM

## 2017-06-18 NOTE — Evaluation (Signed)
Occupational Therapy Evaluation Patient Details Name: [name] MRN: [id number] DOB: 02-23-1961 Today's Date: 06/18/2017    History of Present Illness Pt is a 56 yo male admitted through ED on 06/17/17 with L UE arm and shoulder numbness. MRI and CT are negative for acute infarct. Pt was admitted under observation. PMH significant for stroke, HTN, DM2, CAD and lumbar DDD and cervical stenosis.    Clinical Impression   This 56 y/o M presents with the above. Pt lives alone, and at baseline is independent with ADLs and functional mobility with intermittent assist from neighbors for completing LB dressing (mostly TEDs). Pt completed room level functional mobility with supervision this session, reports increased difficulty completing LB ADLs and tub transfers. Educated Pt on proper DME and AE to increase safety and independence during task completion. Will continue to follow acutely to maximize Pt's safety and mobility with ADLs and functional mobility.     Follow Up Recommendations  No OT follow up;Supervision - Intermittent    Equipment Recommendations  Tub/shower bench;Toilet riser;Other (comment) (will need bariatric equipment)           Precautions / Restrictions Precautions Precautions: None Restrictions Weight Bearing Restrictions: No      Mobility Bed Mobility               General bed mobility comments: OOB in recliner   Transfers Overall transfer level: Modified independent               General transfer comment: no physical assist needed to stand     Balance Overall balance assessment: No apparent balance deficits (not formally assessed)                                         ADL either performed or assessed with clinical judgement   ADL Overall ADL's : Needs assistance/impaired Eating/Feeding: Independent;Sitting   Grooming: Wash/dry hands;Supervision/safety;Standing   Upper Body Bathing: Supervision/ safety;Sitting    Lower Body Bathing: Min guard;Sit to/from stand   Upper Body Dressing : Supervision/safety;Sitting   Lower Body Dressing: Minimal assistance;Sit to/from stand Lower Body Dressing Details (indicate cue type and reason): educated Pt on use of AE for completing LB dressing; Pt reports increased difficulty donning TEDs, has a neighbor assist; issued reacher to aide in LB dressing (Pt declined sock aide as they typically aren't strong enough to support donning TEDs)  Toilet Transfer: Supervision/safety;Ambulation;Grab bars;Regular Museum/gallery exhibitions officer and Hygiene: Min guard;Sit to/from stand       Functional mobility during ADLs: Supervision/safety General ADL Comments: Educated Pt on DME and AE use, issued pictures of sample bathroom DME for increased understanding     Vision Baseline Vision/History:  (blurred vision ) Patient Visual Report: Blurring of vision Vision Assessment?: Yes Eye Alignment: Within Functional Limits Ocular Range of Motion: Within Functional Limits Tracking/Visual Pursuits: Able to track stimulus in all quads without difficulty                Pertinent Vitals/Pain Pain Assessment: Faces Faces Pain Scale: Hurts a little bit Pain Location: back, neck and RUE Pain Descriptors / Indicators: Aching;Discomfort;Constant Pain Intervention(s): Monitored during session;Limited activity within patient's tolerance     Hand Dominance Right   Extremity/Trunk Assessment Upper Extremity Assessment Upper Extremity Assessment: Generalized weakness   Lower Extremity Assessment Lower Extremity Assessment: Defer to PT evaluation   Cervical / Trunk Assessment  Cervical / Trunk Assessment: Normal   Communication Communication Communication: No difficulties   Cognition Arousal/Alertness: Awake/alert Behavior During Therapy: WFL for tasks assessed/performed Overall Cognitive Status: Within Functional Limits for tasks assessed                                                       Home Living Family/patient expects to be discharged to:: Private residence Living Arrangements: Alone Available Help at Discharge: Neighbor Type of Home: Apartment Home Access: Level entry;Elevator     Home Layout: One level     Bathroom Shower/Tub: Teacher, early years/pre: Standard     Home Equipment: Cane - single point          Prior Functioning/Environment Level of Independence: Independent with assistive device(s)        Comments: uses [place] for mobility. Has diffiuclty donning compression stockings        OT Problem List: Decreased strength;Decreased knowledge of use of DME or AE;Decreased activity tolerance      OT Treatment/Interventions: Self-care/ADL training;DME and/or AE instruction;Therapeutic activities;Therapeutic exercise;Patient/family education    OT Goals(Current goals can be found in the care plan section) Acute Rehab OT Goals Patient Stated Goal: to have less pain and be able to move better OT Goal Formulation: With patient Time For Goal Achievement: 07/02/17 Potential to Achieve Goals: Good  OT Frequency: Min 2X/week                             AM-PAC PT "6 Clicks" Daily Activity     Outcome Measure Help from another person eating meals?: None Help from another person taking care of personal grooming?: None Help from another person toileting, which includes using toliet, bedpan, or urinal?: A Little Help from another person bathing (including washing, rinsing, drying)?: A Little Help from another person to put on and taking off regular upper body clothing?: None Help from another person to put on and taking off regular lower body clothing?: A Little 6 Click Score: 21   End of Session Nurse Communication: Mobility status  Activity Tolerance: Patient tolerated treatment well Patient left: in chair;with call bell/phone within reach  OT Visit Diagnosis: Other symptoms  and signs involving the nervous system (R29.898);Muscle weakness (generalized) (M62.81)                Time: 4656-8127 OT Time Calculation (min): 23 min Charges:  OT General Charges $OT Visit: 1 Procedure OT Evaluation $OT Eval Low Complexity: 1 Procedure G-Codes: OT G-codes **NOT FOR INPATIENT CLASS** Functional Assessment Tool Used: AM-PAC 6 Clicks Daily Activity;Clinical judgement Functional Limitation: Self care Self Care Current Status (N1700): At least 20 percent but less than 40 percent impaired, limited or restricted Self Care Goal Status (F7494): At least 1 percent but less than 20 percent impaired, limited or restricted   Lou Cal, OT Pager [phone] 06/18/2017   Raymondo Band 06/18/2017, 2:44 PM

## 2017-06-18 NOTE — Discharge Summary (Signed)
Physician Discharge Summary  TRAEGER SULTANA NIO:[id number] DOB: Sep 27, 1961 DOA: 06/17/2017  PCP: Rogers Blocker, MD  Admit date: 06/17/2017 Discharge date: 06/18/2017  Time spent: 35 minutes  Recommendations for Outpatient Follow-up:  Repeat BMET to follow electrolytes and renal function Close follow up to patient CBG's and TG level; started on Fish oil and Levemir; adjust medications as needed. Check 2-D echo as an outpatient ( recommended by neurology) Continue work up for cervical radiculopathy and left shoulder pain (with possible nerve impingement)  Discharge Diagnoses:  Hx of stroke and TIA Left arm numbness HLD (mixed; with elevated TG) HTN (hypertension) Type 2 diabetes mellitus with vascular disease ([place]) morbid obesity   Discharge Condition: stable and improved. Discharge home with instruction to follow up with PCP and to continue work up for cervical radiculopathy.  Diet recommendation: low calorie/modified carbohydrates and heart healthy diet.   Filed Weights   06/17/17 0124  Weight: (!) 147.4 kg (324 lb 15.3 oz)    History of present illness:  56 y.o. male with medical history significant of Stroke, HTN, DM2, CAD.  Patient presents to the ED with sudden onset of L arm and shoulder numbness at 0010 this morning.  5 min later patient began to have a headache, dizziness, blurred vision, and intermittent dysphagia / aphasia.  CBG 244.    Hospital Course:  1-left arm numbness/TIA symptoms/prior hx of strokes -neg CT and no acute abnormalities seen on MRI -after discussing with neurology service, presentation most likely due to cervical radiculopathy  -patient recommended to have 2-D echo as an outpatient -continue risk factors modifications, especially diabetes control -will continue ASA and plavix for secondary precaution   2-HTN -continue home antihypertensive regimen -advise to follow heart healthy diet   3-type 2 diabetes: uncontrolled -will d/c  glipizide -continue metformin and sitagliptin  -and start patient on levemir -advise to follow low carb diet  4-morbid obesity -Body mass index is 46.63 kg/m. -low calorie diet and exercise recommended -will benefit of evaluation in bariatric clinic   5-GERD -continue PPI and pepcid  6-depression -continue trazodone and zoloft  7-HLD/elevated TG -will continue statins and started fish oil   Procedures:  See below for x-ray report  Consultations:  Neurology   Discharge Exam: Vitals:   06/17/17 1400 06/17/17 2219  BP: 128/66 (!) 155/80  Pulse: 71 69  Resp: 18 18  Temp: 97.6 F (36.4 C) 98.6 F (37 C)  SpO2: 94% 97%    General: afebrile, no CP, no SOB, no dysarthria. Able to follow commands properly and in no acute distress. Cardiovascular: S1 and S2, no rubs, no gallops Respiratory: CTA bilaterally Abd: obese, soft, NT, ND Extremities: no edema, no cyanosis Neurology:AAOX3, LUE weakness 4/5, with associated intermittent tingling/numbness (no present at discharge); no dysarthria and no other neurologic deficit appreciated.   Discharge Instructions   Discharge Instructions    Diet - low sodium heart healthy    Complete by:  As directed    Diet Carb Modified    Complete by:  As directed    Discharge instructions    Complete by:  As directed    Follow low calorie diet and modified carbohydrates Take medications as prescribed Watch and decrease intake of fatty food. Follow up with PCP as instructed or not longer than 10 days Wear your CPAP every night     Current Discharge Medication List    START taking these medications   Details  Insulin Detemir (LEVEMIR) 100 UNIT/ML Pen Inject 20  Units into the skin daily at 10 pm. Qty: 15 mL, Refills: 3    Insulin Pen Needle 31G X 5 MM MISC Use 1 needle daily to inject insulin as prescribed Qty: 100 each, Refills: 2    Omega-3 Fatty Acids (FISH OIL CONCENTRATE) 1000 MG CAPS Take 2 capsules (2,000 mg total) by  mouth daily. Qty: 60 capsule, Refills: 2      CONTINUE these medications which have CHANGED   Details  famotidine (PEPCID) 40 MG tablet Take 1 tablet (40 mg total) by mouth at bedtime. Qty: 30 tablet, Refills: 2    pantoprazole (PROTONIX) 40 MG tablet Take 1 tablet (40 mg total) by mouth 2 (two) times daily. Qty: 60 tablet, Refills: 1      CONTINUE these medications which have NOT CHANGED   Details  amLODipine (NORVASC) 10 MG tablet Take 10 mg by mouth daily.    aspirin 81 MG chewable tablet Chew 1 tablet (81 mg total) by mouth daily. Qty: 30 tablet, Refills: 0    atorvastatin (LIPITOR) 80 MG tablet Take 1 tablet (80 mg total) by mouth daily. Qty: 90 tablet, Refills: 3   Associated Diagnoses: Encounter for pre-operative cardiovascular clearance    carvedilol (COREG) 25 MG tablet Take 1 tablet (25 mg total) by mouth 2 (two) times daily with a meal. Qty: 180 tablet, Refills: 3    clopidogrel (PLAVIX) 75 MG tablet Take 75 mg by mouth daily.    furosemide (LASIX) 40 MG tablet Take 1 tablet (40 mg total) by mouth 2 (two) times daily. Qty: 180 tablet, Refills: 3    hydrALAZINE (APRESOLINE) 50 MG tablet Take 50 mg by mouth 2 (two) times daily.    isosorbide dinitrate (ISORDIL) 30 MG tablet Take 1 tablet (30 mg total) by mouth 2 (two) times daily. Qty: 60 tablet, Refills: 4    LYRICA 75 MG capsule Take 75 mg by mouth 2 (two) times daily. Refills: 2    metFORMIN (GLUCOPHAGE-XR) 500 MG 24 hr tablet Take 1,000 mg by mouth 2 (two) times daily. Refills: 5    nitroGLYCERIN (NITROSTAT) 0.4 MG SL tablet Place 0.4 mg under the tongue every 5 (five) minutes as needed for chest pain. Refills: 4    ondansetron (ZOFRAN ODT) 4 MG disintegrating tablet Take 1 tablet (4 mg total) by mouth every 8 (eight) hours as needed for nausea or vomiting. Qty: 20 tablet, Refills: 0    Oxycodone HCl 10 MG TABS Take 10 mg by mouth 3 (three) times daily as needed (for pain).  Refills: 0    potassium  chloride SA (K-DUR,KLOR-CON) 20 MEQ tablet Take 20 mEq by mouth daily.    sertraline (ZOLOFT) 50 MG tablet Take 50 mg by mouth daily.    sitaGLIPtin (JANUVIA) 100 MG tablet Take 100 mg by mouth daily.    tiZANidine (ZANAFLEX) 2 MG tablet Take 2 mg by mouth every 6 (six) hours as needed for muscle spasms.    traZODone (DESYREL) 50 MG tablet Take 1 tablet (50 mg total) by mouth at bedtime. Qty: 30 tablet, Refills: 11   Associated Diagnoses: Depression, unspecified depression type      STOP taking these medications     glipiZIDE (GLUCOTROL) 10 MG tablet        Allergies  Allergen Reactions  . Coconut Flavor [Flavoring Agent] Hives  . Coconut Oil Hives  . Ibuprofen Other (See Comments)    Made gastric ulcers worse   Follow-up Information    Rogers Blocker, MD.  Schedule an appointment as soon as possible for a visit in 10 day(s).   Specialty:  Internal Medicine Contact information: [address] [place] [address] [phone]            The results of significant diagnostics from this hospitalization (including imaging, microbiology, ancillary and laboratory) are listed below for reference.    Significant Diagnostic Studies: Ct Angio Head W Or Wo Contrast  Result Date: 06/17/2017 CLINICAL DATA:  56 year old male code stroke presentation with left side weakness. EXAM: CT ANGIOGRAPHY HEAD AND NECK TECHNIQUE: Multidetector CT imaging of the head and neck was performed using the standard protocol during bolus administration of intravenous contrast. Multiplanar CT image reconstructions and MIPs were obtained to evaluate the vascular anatomy. Carotid stenosis measurements (when applicable) are obtained utilizing NASCET criteria, using the distal internal carotid diameter as the denominator. CONTRAST:  50 mL Isovue 370 COMPARISON:  Head CT without contrast 0057 hours today, and earlier. Brain MRI 04/01/2014. Chest CTA 06/27/2016. FINDINGS: CTA NECK Skeleton: No acute  osseous abnormality identified. Degenerative osteophytes and occasional interbody ankylosis in the spine. Upper chest: Mediastinal lipomatosis. Enlarged central pulmonary artery (estimated at 43 mm diameter versus adjacent ascending aorta of 41 mm). Atelectatic changes to the trachea. Negative visible lung parenchyma. No superior mediastinal lymphadenopathy. Other neck: Negative.  No cervical lymphadenopathy. Aortic arch: Suboptimal contrast bolus, with significant reflux of venous contrast into both internal jugular veins, and left chest wall venous collaterals. There is no significant central venous stenosis visible on these images. Three vessel arch configuration with minimal arch atherosclerosis. Right carotid system: Mild soft plaque in the right CCA without stenosis. Soft and calcified plaque at the right carotid bifurcation and posterior right ICA origin and bulb with less than 50 % stenosis with respect to the distal vessel. Left carotid system: The left CCA at the level of the hypopharynx is obscured by dense streak artifact from contrast reflux into the left IJ. The visible left CCA is patent without stenosis. Left carotid bifurcation is patent without stenosis. Partially retropharyngeal course of the left ICA in the neck. Vertebral arteries: No proximal right subclavian artery stenosis. Right vertebral artery origin appears normal. Suboptimal proximal V 2 contrast, but no right vertebral artery stenosis identified in the neck. No proximal left subclavian artery stenosis. Suboptimal proximal left vertebral artery contrast, but the left vertebral artery origin and proximal vessel appear grossly normal. The distal left vertebral artery is patent without stenosis. CTA HEAD Posterior circulation: Patent distal vertebral arteries and vertebrobasilar junction with atherosclerosis but no definite hemodynamically significant stenosis. Patent basilar artery without stenosis. SCA and PCA origins are patent.  Posterior communicating arteries are diminutive or absent. Bilateral PCA branches are regular but patent. Anterior circulation: Both ICA siphons are patent. Moderate left and mild right ICA siphon calcified plaque. No significant siphon stenosis. Patent carotid termini. Patent MCA and ACA origins. Both MCA M1 segments are patent but irregular compatible with atherosclerosis. Allowing for the suboptimal contrast bolus timing, no left MCA branch occlusion is identified. Dominant right ACA A1 segment. Anterior communicating artery and bilateral ACA branches are patent with mild to moderate A2 segment irregularity. Venous sinuses: Early contrast timing, not evaluated. Anatomic variants: Dominant right A1 segment. Review of the MIP images confirms the above findings IMPRESSION: 1. Suboptimal arterial contrast timing. Negative for large vessel occlusion. This was preliminarily discussed by telephone with Dr. Kerney Elbe on 06/17/2017 at 0118 hours. 2. Widespread intracranial atherosclerosis, including involvement of the bilateral M1  segments. No MCA branch occlusion identified. 3. No hemodynamically significant stenosis identified in the neck. 4. Chronically enlarged main pulmonary artery suggesting pulmonary artery hypertension. Electronically Signed   By: Genevie Ann M.D.   On: 06/17/2017 01:33   Ct Angio Neck W Or Wo Contrast  Result Date: 06/17/2017 CLINICAL DATA:  56 year old male code stroke presentation with left side weakness. EXAM: CT ANGIOGRAPHY HEAD AND NECK TECHNIQUE: Multidetector CT imaging of the head and neck was performed using the standard protocol during bolus administration of intravenous contrast. Multiplanar CT image reconstructions and MIPs were obtained to evaluate the vascular anatomy. Carotid stenosis measurements (when applicable) are obtained utilizing NASCET criteria, using the distal internal carotid diameter as the denominator. CONTRAST:  50 mL Isovue 370 COMPARISON:  Head CT without  contrast 0057 hours today, and earlier. Brain MRI 04/01/2014. Chest CTA 06/27/2016. FINDINGS: CTA NECK Skeleton: No acute osseous abnormality identified. Degenerative osteophytes and occasional interbody ankylosis in the spine. Upper chest: Mediastinal lipomatosis. Enlarged central pulmonary artery (estimated at 43 mm diameter versus adjacent ascending aorta of 41 mm). Atelectatic changes to the trachea. Negative visible lung parenchyma. No superior mediastinal lymphadenopathy. Other neck: Negative.  No cervical lymphadenopathy. Aortic arch: Suboptimal contrast bolus, with significant reflux of venous contrast into both internal jugular veins, and left chest wall venous collaterals. There is no significant central venous stenosis visible on these images. Three vessel arch configuration with minimal arch atherosclerosis. Right carotid system: Mild soft plaque in the right CCA without stenosis. Soft and calcified plaque at the right carotid bifurcation and posterior right ICA origin and bulb with less than 50 % stenosis with respect to the distal vessel. Left carotid system: The left CCA at the level of the hypopharynx is obscured by dense streak artifact from contrast reflux into the left IJ. The visible left CCA is patent without stenosis. Left carotid bifurcation is patent without stenosis. Partially retropharyngeal course of the left ICA in the neck. Vertebral arteries: No proximal right subclavian artery stenosis. Right vertebral artery origin appears normal. Suboptimal proximal V 2 contrast, but no right vertebral artery stenosis identified in the neck. No proximal left subclavian artery stenosis. Suboptimal proximal left vertebral artery contrast, but the left vertebral artery origin and proximal vessel appear grossly normal. The distal left vertebral artery is patent without stenosis. CTA HEAD Posterior circulation: Patent distal vertebral arteries and vertebrobasilar junction with atherosclerosis but no  definite hemodynamically significant stenosis. Patent basilar artery without stenosis. SCA and PCA origins are patent. Posterior communicating arteries are diminutive or absent. Bilateral PCA branches are regular but patent. Anterior circulation: Both ICA siphons are patent. Moderate left and mild right ICA siphon calcified plaque. No significant siphon stenosis. Patent carotid termini. Patent MCA and ACA origins. Both MCA M1 segments are patent but irregular compatible with atherosclerosis. Allowing for the suboptimal contrast bolus timing, no left MCA branch occlusion is identified. Dominant right ACA A1 segment. Anterior communicating artery and bilateral ACA branches are patent with mild to moderate A2 segment irregularity. Venous sinuses: Early contrast timing, not evaluated. Anatomic variants: Dominant right A1 segment. Review of the MIP images confirms the above findings IMPRESSION: 1. Suboptimal arterial contrast timing. Negative for large vessel occlusion. This was preliminarily discussed by telephone with Dr. Kerney Elbe on 06/17/2017 at 0118 hours. 2. Widespread intracranial atherosclerosis, including involvement of the bilateral M1 segments. No MCA branch occlusion identified. 3. No hemodynamically significant stenosis identified in the neck. 4. Chronically enlarged main pulmonary artery suggesting pulmonary artery hypertension.  Electronically Signed   By: Genevie Ann M.D.   On: 06/17/2017 01:33   Mr Brain Wo Contrast  Result Date: 06/17/2017 CLINICAL DATA:  Patient presents to the ED with history of LEFT arm and shoulder numbness beginning earlier today. LEFT-sided weakness? Headache with dizziness. Blurred vision. Aphasia. EXAM: MRI HEAD WITHOUT CONTRAST TECHNIQUE: Multiplanar, multiecho pulse sequences of the brain and surrounding structures were obtained without intravenous contrast. COMPARISON:  None. FINDINGS: The patient could not tolerate the exam due to shortness of breath and it was prematurely  truncated. Brain: No evidence for acute infarction, hemorrhage, mass lesion, hydrocephalus, or extra-axial fluid. Normal for age cerebral volume. No definite white matter disease. Vascular: Flow voids are maintained. Skull and upper cervical spine: Normal marrow signal. Sinuses/Orbits: Negative. Other: None. IMPRESSION: Prematurely truncated exam demonstrating no definite acute or focal intracranial findings. Electronically Signed   By: Staci Righter M.D.   On: 06/17/2017 07:09   Ct Head Code Stroke Wo Contrast  Result Date: 06/17/2017 CLINICAL DATA:  Code stroke. 56 year old male with left side weakness. Last seen normal 0015 hours. EXAM: CT HEAD WITHOUT CONTRAST TECHNIQUE: Contiguous axial images were obtained from the base of the skull through the vertex without intravenous contrast. COMPARISON:  05/05/2017 and earlier. FINDINGS: Brain: Cerebral volume remains normal. No midline shift, ventriculomegaly, mass effect, evidence of mass lesion, intracranial hemorrhage or evidence of cortically based acute infarction. Gray-white matter differentiation is within normal limits throughout the brain. Vascular: Calcified atherosclerosis at the skull base. No suspicious intracranial vascular hyperdensity. Skull: Stable.  No acute osseous abnormality identified. Sinuses/Orbits: Visualized paranasal sinuses and mastoids are stable and well pneumatized. Other: No acute orbit or scalp soft tissue finding. ASPECTS ([place] Stroke Program Early CT Score) - Ganglionic level infarction (caudate, lentiform nuclei, internal capsule, insula, M1-M3 cortex): 7 - Supraganglionic infarction (M4-M6 cortex): 3 Total score (0-10 with 10 being normal): 10 IMPRESSION: 1. Stable and normal noncontrast CT appearance of the brain. 2. ASPECTS is 10. 3. The above was relayed via text pager to Dr. Bruce Donath On 06/17/2017 at 01:07 . Electronically Signed   By: Genevie Ann M.D.   On: 06/17/2017 01:07    Microbiology: No results found for this or  any previous visit (from the past 240 hour(s)).   Labs: Basic Metabolic Panel:  Recent Labs Lab 06/17/17 0051 06/17/17 0100  NA 136 136  K 3.4* 3.4*  CL 98* 99*  CO2 25  --   GLUCOSE 249* 249*  BUN 10 11  CREATININE 0.72 0.50*  CALCIUM 8.8*  --    Liver Function Tests:  Recent Labs Lab 06/17/17 0051  AST 31  ALT 28  ALKPHOS 37*  BILITOT 0.6  PROT 7.1  ALBUMIN 3.6   CBC:  Recent Labs Lab 06/17/17 0051 06/17/17 0100  WBC 10.1  --   NEUTROABS 6.6  --   HGB 11.2* 11.6*  HCT 35.5* 34.0*  MCV 89.0  --   PLT 204  --     BNP (last 3 results)  Recent Labs  12/03/16 1959 04/20/17 1926 05/05/17 2051  BNP 27.3 14.4 11.5    CBG:  Recent Labs Lab 06/17/17 1003 06/17/17 1157 06/17/17 1635 06/17/17 2050 06/18/17 0746  GLUCAP 230* 253* 242* 253* 247*    Signed:  Barton Dubois MD.  [facility] 06/18/2017, 11:06 AM

## 2017-06-19 ENCOUNTER — Ambulatory Visit
Admission: RE | Admit: 2017-06-19 | Discharge: 2017-06-19 | Disposition: A | Discharge status: Home / Self Care | Payer: Medicaid Other | Source: Ambulatory Visit | Attending: Orthopedic Surgery | Admitting: Orthopedic Surgery

## 2017-06-19 DIAGNOSIS — M50122 Cervical disc disorder at C5-C6 level with radiculopathy: Secondary | ICD-10-CM

## 2017-06-29 ENCOUNTER — Emergency Department (HOSPITAL_COMMUNITY): Payer: Medicaid Other

## 2017-06-29 ENCOUNTER — Observation Stay (HOSPITAL_COMMUNITY)
Admission: EM | Admit: 2017-06-29 | Discharge: 2017-06-30 | Disposition: A | Discharge status: Home / Self Care | Payer: Medicaid Other | Attending: Internal Medicine | Admitting: Internal Medicine

## 2017-06-29 DIAGNOSIS — F329 Major depressive disorder, single episode, unspecified: Secondary | ICD-10-CM | POA: Insufficient documentation

## 2017-06-29 DIAGNOSIS — E785 Hyperlipidemia, unspecified: Secondary | ICD-10-CM | POA: Diagnosis present

## 2017-06-29 DIAGNOSIS — Z7902 Long term (current) use of antithrombotics/antiplatelets: Secondary | ICD-10-CM | POA: Insufficient documentation

## 2017-06-29 DIAGNOSIS — I6310 Cerebral infarction due to embolism of unspecified precerebral artery: Secondary | ICD-10-CM | POA: Diagnosis not present

## 2017-06-29 DIAGNOSIS — K219 Gastro-esophageal reflux disease without esophagitis: Secondary | ICD-10-CM | POA: Diagnosis present

## 2017-06-29 DIAGNOSIS — Z8673 Personal history of transient ischemic attack (TIA), and cerebral infarction without residual deficits: Secondary | ICD-10-CM | POA: Diagnosis present

## 2017-06-29 DIAGNOSIS — R072 Precordial pain: Secondary | ICD-10-CM | POA: Diagnosis not present

## 2017-06-29 DIAGNOSIS — R079 Chest pain, unspecified: Secondary | ICD-10-CM | POA: Diagnosis present

## 2017-06-29 DIAGNOSIS — I10 Essential (primary) hypertension: Secondary | ICD-10-CM | POA: Diagnosis present

## 2017-06-29 DIAGNOSIS — Z79899 Other long term (current) drug therapy: Secondary | ICD-10-CM | POA: Diagnosis not present

## 2017-06-29 DIAGNOSIS — E119 Type 2 diabetes mellitus without complications: Secondary | ICD-10-CM | POA: Insufficient documentation

## 2017-06-29 DIAGNOSIS — Z7982 Long term (current) use of aspirin: Secondary | ICD-10-CM | POA: Diagnosis not present

## 2017-06-29 DIAGNOSIS — R0602 Shortness of breath: Secondary | ICD-10-CM | POA: Insufficient documentation

## 2017-06-29 DIAGNOSIS — I2510 Atherosclerotic heart disease of native coronary artery without angina pectoris: Secondary | ICD-10-CM | POA: Diagnosis present

## 2017-06-29 DIAGNOSIS — M79669 Pain in unspecified lower leg: Secondary | ICD-10-CM

## 2017-06-29 DIAGNOSIS — E1159 Type 2 diabetes mellitus with other circulatory complications: Secondary | ICD-10-CM | POA: Diagnosis not present

## 2017-06-29 DIAGNOSIS — Z794 Long term (current) use of insulin: Secondary | ICD-10-CM | POA: Insufficient documentation

## 2017-06-29 DIAGNOSIS — F32A Depression, unspecified: Secondary | ICD-10-CM | POA: Diagnosis present

## 2017-06-29 DIAGNOSIS — I6340 Cerebral infarction due to embolism of unspecified cerebral artery: Secondary | ICD-10-CM | POA: Diagnosis present

## 2017-06-29 LAB — HEPATIC FUNCTION PANEL
ALT: 22 U/L (ref 17–63)
AST: 31 U/L (ref 15–41)
Albumin: 3.7 g/dL (ref 3.5–5.0)
Alkaline Phosphatase: 37 U/L — ABNORMAL LOW (ref 38–126)
Bilirubin, Direct: 0.2 mg/dL (ref 0.1–0.5)
Indirect Bilirubin: 0.5 mg/dL (ref 0.3–0.9)
Total Bilirubin: 0.7 mg/dL (ref 0.3–1.2)
Total Protein: 6.4 g/dL — ABNORMAL LOW (ref 6.5–8.1)

## 2017-06-29 LAB — CBC
HCT: 35.5 % — ABNORMAL LOW (ref 39.0–52.0)
Hemoglobin: 11.3 g/dL — ABNORMAL LOW (ref 13.0–17.0)
MCH: 28.2 pg (ref 26.0–34.0)
MCHC: 31.8 g/dL (ref 30.0–36.0)
MCV: 88.5 fL (ref 78.0–100.0)
Platelets: 235 10*3/uL (ref 150–400)
RBC: 4.01 MIL/uL — ABNORMAL LOW (ref 4.22–5.81)
RDW: 14.4 % (ref 11.5–15.5)
WBC: 8.8 10*3/uL (ref 4.0–10.5)

## 2017-06-29 LAB — BASIC METABOLIC PANEL
Anion gap: 14 (ref 5–15)
BUN: 8 mg/dL (ref 6–20)
CO2: 23 mmol/L (ref 22–32)
Calcium: 8.5 mg/dL — ABNORMAL LOW (ref 8.9–10.3)
Chloride: 96 mmol/L — ABNORMAL LOW (ref 101–111)
Creatinine, Ser: 0.79 mg/dL (ref 0.61–1.24)
GFR calc Af Amer: >60 mL/min (ref >60)
GFR calc non Af Amer: >60 mL/min (ref >60)
Glucose, Bld: 217 mg/dL — ABNORMAL HIGH (ref 65–99)
Potassium: 4.3 mmol/L (ref 3.5–5.1)
Sodium: 133 mmol/L — ABNORMAL LOW (ref 135–145)

## 2017-06-29 LAB — BRAIN NATRIURETIC PEPTIDE: B Natriuretic Peptide: 19.4 pg/mL (ref 0.0–100.0)

## 2017-06-29 LAB — I-STAT TROPONIN, ED: Troponin i, poc: 0 ng/mL (ref 0.00–0.08)

## 2017-06-29 LAB — LIPASE, BLOOD: Lipase: 32 U/L (ref 11–51)

## 2017-06-29 MED ORDER — PANTOPRAZOLE SODIUM 40 MG PO TBEC
40.0000 mg | DELAYED_RELEASE_TABLET | Freq: Two times a day (BID) | ORAL | Status: DC
  Administered 2017-06-30 (×2): 40 mg via ORAL
  Filled 2017-06-29 (×2): qty 1

## 2017-06-29 MED ORDER — PREGABALIN 50 MG PO CAPS
75.0000 mg | ORAL_CAPSULE | Freq: Two times a day (BID) | ORAL | Status: DC
  Administered 2017-06-30 (×2): 75 mg via ORAL
  Filled 2017-06-29: qty 1
  Filled 2017-06-29: qty 3

## 2017-06-29 MED ORDER — SERTRALINE HCL 50 MG PO TABS
50.0000 mg | ORAL_TABLET | Freq: Every day | ORAL | Status: DC
  Administered 2017-06-30: 50 mg via ORAL
  Filled 2017-06-29: qty 1

## 2017-06-29 MED ORDER — MORPHINE SULFATE (PF) 4 MG/ML IV SOLN
4.0000 mg | Freq: Once | INTRAVENOUS | Status: AC
  Administered 2017-06-29: 4 mg via INTRAVENOUS
  Filled 2017-06-29: qty 1

## 2017-06-29 MED ORDER — NITROGLYCERIN 0.4 MG SL SUBL: 0.4000 mg | SUBLINGUAL_TABLET | SUBLINGUAL | Status: DC | PRN

## 2017-06-29 MED ORDER — ASPIRIN 81 MG PO CHEW
81.0000 mg | CHEWABLE_TABLET | Freq: Every day | ORAL | Status: DC
  Administered 2017-06-30 (×2): 81 mg via ORAL
  Filled 2017-06-29 (×2): qty 1

## 2017-06-29 MED ORDER — ONDANSETRON HCL 4 MG/2ML IJ SOLN: 4.0000 mg | Freq: Four times a day (QID) | INTRAMUSCULAR | Status: DC | PRN

## 2017-06-29 MED ORDER — INSULIN ASPART 100 UNIT/ML ~~LOC~~ SOLN
0.0000 [IU] | Freq: Three times a day (TID) | SUBCUTANEOUS | Status: DC
  Administered 2017-06-30 (×2): 2 [IU] via SUBCUTANEOUS

## 2017-06-29 MED ORDER — TRAZODONE HCL 50 MG PO TABS
50.0000 mg | ORAL_TABLET | Freq: Every day | ORAL | Status: DC
  Administered 2017-06-30: 50 mg via ORAL
  Filled 2017-06-29: qty 1

## 2017-06-29 MED ORDER — HYDROMORPHONE HCL 1 MG/ML IJ SOLN
1.0000 mg | INTRAMUSCULAR | Status: DC | PRN
  Administered 2017-06-30 (×2): 1 mg via INTRAVENOUS
  Filled 2017-06-29 (×2): qty 1

## 2017-06-29 MED ORDER — SODIUM CHLORIDE 0.9 % IV SOLN
INTRAVENOUS | Status: DC
  Administered 2017-06-30: 02:00:00 via INTRAVENOUS

## 2017-06-29 MED ORDER — CLOPIDOGREL BISULFATE 75 MG PO TABS
75.0000 mg | ORAL_TABLET | Freq: Every day | ORAL | Status: DC
  Administered 2017-06-30: 75 mg via ORAL
  Filled 2017-06-29: qty 1

## 2017-06-29 MED ORDER — HYDRALAZINE HCL 20 MG/ML IJ SOLN: 5.0000 mg | INTRAMUSCULAR | Status: DC | PRN

## 2017-06-29 MED ORDER — HYDRALAZINE HCL 50 MG PO TABS
50.0000 mg | ORAL_TABLET | Freq: Two times a day (BID) | ORAL | Status: DC
  Filled 2017-06-29 (×2): qty 1

## 2017-06-29 MED ORDER — OXYCODONE HCL 5 MG PO TABS
10.0000 mg | ORAL_TABLET | Freq: Four times a day (QID) | ORAL | Status: DC | PRN
Start: 2017-06-29 — End: 2017-06-30
  Administered 2017-06-30: 10 mg via ORAL
  Filled 2017-06-29: qty 2

## 2017-06-29 MED ORDER — CARVEDILOL 25 MG PO TABS
25.0000 mg | ORAL_TABLET | Freq: Two times a day (BID) | ORAL | Status: DC
  Filled 2017-06-29: qty 1

## 2017-06-29 MED ORDER — HEPARIN (PORCINE) IN NACL 100-0.45 UNIT/ML-% IJ SOLN
1350.0000 [IU]/h | INTRAMUSCULAR | Status: DC
  Administered 2017-06-30: 1350 [IU]/h via INTRAVENOUS
  Filled 2017-06-29: qty 250

## 2017-06-29 MED ORDER — ISOSORBIDE DINITRATE 30 MG PO TABS
30.0000 mg | ORAL_TABLET | Freq: Two times a day (BID) | ORAL | Status: DC
  Administered 2017-06-30 (×2): 30 mg via ORAL
  Filled 2017-06-29: qty 1
  Filled 2017-06-29: qty 3
  Filled 2017-06-29: qty 1
  Filled 2017-06-29: qty 3

## 2017-06-29 MED ORDER — HEPARIN BOLUS VIA INFUSION
4000.0000 [IU] | Freq: Once | INTRAVENOUS | Status: AC
Start: 2017-06-30 — End: 2017-06-30
  Administered 2017-06-30: 4000 [IU] via INTRAVENOUS
  Filled 2017-06-29: qty 4000

## 2017-06-29 MED ORDER — ASPIRIN 81 MG PO CHEW: 324.0000 mg | CHEWABLE_TABLET | Freq: Every day | ORAL | Status: DC

## 2017-06-29 MED ORDER — ATORVASTATIN CALCIUM 80 MG PO TABS
80.0000 mg | ORAL_TABLET | Freq: Every day | ORAL | Status: DC
  Administered 2017-06-30: 80 mg via ORAL
  Filled 2017-06-29: qty 1

## 2017-06-29 MED ORDER — FAMOTIDINE 20 MG PO TABS
40.0000 mg | ORAL_TABLET | Freq: Every day | ORAL | Status: DC
  Administered 2017-06-30: 40 mg via ORAL
  Filled 2017-06-29: qty 2

## 2017-06-29 MED ORDER — ACETAMINOPHEN 325 MG PO TABS: 650.0000 mg | ORAL_TABLET | ORAL | Status: DC | PRN

## 2017-06-29 MED ORDER — TIZANIDINE HCL 2 MG PO TABS
2.0000 mg | ORAL_TABLET | Freq: Four times a day (QID) | ORAL | Status: DC | PRN
  Filled 2017-06-29: qty 1

## 2017-06-29 MED ORDER — INSULIN DETEMIR 100 UNIT/ML ~~LOC~~ SOLN
20.0000 [IU] | Freq: Every day | SUBCUTANEOUS | Status: DC
  Filled 2017-06-29: qty 0.2

## 2017-06-29 MED ORDER — ZOLPIDEM TARTRATE 5 MG PO TABS: 5.0000 mg | ORAL_TABLET | Freq: Every evening | ORAL | Status: DC | PRN

## 2017-06-29 MED ORDER — FUROSEMIDE 40 MG PO TABS
40.0000 mg | ORAL_TABLET | Freq: Two times a day (BID) | ORAL | Status: DC
  Administered 2017-06-30: 40 mg via ORAL
  Filled 2017-06-29: qty 1

## 2017-06-29 MED ORDER — DIPHENHYDRAMINE HCL 50 MG/ML IJ SOLN
25.0000 mg | Freq: Once | INTRAMUSCULAR | Status: AC
  Administered 2017-06-29: 25 mg via INTRAVENOUS
  Filled 2017-06-29: qty 1

## 2017-06-29 NOTE — ED Provider Notes (Signed)
[facility] DEPT Provider Note   CSN: [id number] Arrival date & time: 06/29/17  2020     History   Chief Complaint Chief Complaint  Patient presents with  . Chest Pain    HPI [name] is a 56 y.o. male.  The history is provided by the patient and medical records.  Chest Pain   This is a recurrent problem. The current episode started 1 to 2 hours ago. The problem occurs constantly. The problem has not changed since onset.The pain is present in the substernal region. The pain is at a severity of 8/10. The pain is severe. The quality of the pain is described as heavy and pressure-like. The pain radiates to the epigastrium, left jaw, left shoulder and left arm. Duration of episode(s) is 1 hour. Associated symptoms include abdominal pain, exertional chest pressure, lower extremity edema, palpitations and shortness of breath. Pertinent negatives include no back pain, no cough, no diaphoresis, no fever, no headaches, no hemoptysis, no irregular heartbeat, no nausea, no near-syncope, no sputum production, no syncope and no vomiting. He has tried nitroglycerin for the symptoms. The treatment provided mild relief. Risk factors include male gender and obesity.  His past medical history is significant for CAD, DVT, MI and PE.    Past Medical History:  Diagnosis Date  . Arthritis   . Back pain   . CAD (coronary artery disease)    a. s/p DES to LAD in 05/2016  . DVT (deep venous thrombosis) ([place])   . Hyperlipidemia   . Hypertension   . IBS (irritable bowel syndrome)   . OSA (obstructive sleep apnea)   . Pancreatitis   . PE (pulmonary thromboembolism) ([place])   . PUD (peptic ulcer disease)   . Renal disorder   . Stroke ([place])   . Type 2 diabetes mellitus [facility])     Patient Active Problem List   Diagnosis Date Noted  . Left arm numbness   . Cerebral embolism with cerebral infarction 06/17/2017  . TIA (transient ischemic attack) 06/17/2017  . Chronic back pain 12/29/2016  .  Depression 12/15/2016  . Vitamin D deficiency 12/09/2016  . Right hip pain 12/07/2016  . Hypokalemia 12/07/2016  . Type 2 diabetes mellitus with vascular disease ([place]) 05/31/2016  . Normocytic normochromic anemia 05/31/2016  . CAD (coronary artery disease) 01/06/2016  . DVT (deep venous thrombosis) ([place]) 01/06/2016  . Lactic acidosis 05/28/2014  . Nonspecific chest pain 01/29/2014  . Uncontrolled secondary diabetes with peripheral neuropathy ([place]) 01/29/2014  . Obesity, Class III, BMI 40-49.9 (morbid obesity) ([place]) 01/29/2014  . Snoring 01/29/2014  . Dyslipidemia 01/29/2014  . HTN (hypertension) 01/29/2014  . Abnormal nuclear stress test 01/29/2014    Past Surgical History:  Procedure Laterality Date  . CARDIAC CATHETERIZATION N/A 05/31/2016   Procedure: Left Heart Cath and Coronary Angiography;  Surgeon: Peter M Martinique, MD;  Location: [place] [facility] LAB;  Service: Cardiovascular;  Laterality: N/A;  . CARDIAC CATHETERIZATION N/A 05/31/2016   Procedure: Intravascular Pressure Wire/FFR Study;  Surgeon: Peter M Martinique, MD;  Location: [place] CV LAB;  Service: Cardiovascular;  Laterality: N/A;  . CARDIAC CATHETERIZATION N/A 05/31/2016   Procedure: Coronary Stent Intervention;  Surgeon: Peter M Martinique, MD;  Location: [place] CV LAB;  Service: Cardiovascular;  Laterality: N/A;  . LEFT HEART CATHETERIZATION WITH CORONARY ANGIOGRAM N/A 02/03/2014   Procedure: LEFT HEART CATHETERIZATION WITH CORONARY ANGIOGRAM;  Surgeon: Pixie Casino, MD;  Location: [facility] CATH LAB;  Service: Cardiovascular;  Laterality: N/A;  .  left leg stent          Home Medications    Prior to Admission medications   Medication Sig Start Date End Date Taking? Authorizing Provider  amLODipine (NORVASC) 10 MG tablet Take 10 mg by mouth daily.    [provider]  aspirin 81 MG chewable tablet Chew 1 tablet (81 mg total) by mouth daily. 06/01/16   Elwin Mocha, MD  atorvastatin (LIPITOR) 80 MG tablet Take 1  tablet (80 mg total) by mouth daily. 01/26/17   Ahmed Prima, Fransisco Hertz, PA-C  carvedilol (COREG) 25 MG tablet Take 1 tablet (25 mg total) by mouth 2 (two) times daily with a meal. 01/26/17   Strader, [place] M, PA-C  clopidogrel (PLAVIX) 75 MG tablet Take 75 mg by mouth daily.    [provider]  famotidine (PEPCID) 40 MG tablet Take 1 tablet (40 mg total) by mouth at bedtime. 06/18/17   Barton Dubois, MD  furosemide (LASIX) 40 MG tablet Take 1 tablet (40 mg total) by mouth 2 (two) times daily. 01/26/17   Strader, Fransisco Hertz, PA-C  hydrALAZINE (APRESOLINE) 50 MG tablet Take 50 mg by mouth 2 (two) times daily.    [provider]  Insulin Detemir (LEVEMIR) 100 UNIT/ML Pen Inject 20 Units into the skin daily at 10 pm. 06/18/17   Barton Dubois, MD  Insulin Pen Needle 31G X 5 MM MISC Use 1 needle daily to inject insulin as prescribed 06/18/17   Barton Dubois, MD  isosorbide dinitrate (ISORDIL) 30 MG tablet Take 1 tablet (30 mg total) by mouth 2 (two) times daily. 11/22/16   Strader, Fransisco Hertz, PA-C  LYRICA 75 MG capsule Take 75 mg by mouth 2 (two) times daily. 04/03/17   [provider]  metFORMIN (GLUCOPHAGE-XR) 500 MG 24 hr tablet Take 1,000 mg by mouth 2 (two) times daily. 04/14/17   [provider]  nitroGLYCERIN (NITROSTAT) 0.4 MG SL tablet Place 0.4 mg under the tongue every 5 (five) minutes as needed for chest pain. 12/17/16   [provider]  Omega-3 Fatty Acids (FISH OIL CONCENTRATE) 1000 MG CAPS Take 2 capsules (2,000 mg total) by mouth daily. 06/18/17   Barton Dubois, MD  ondansetron (ZOFRAN ODT) 4 MG disintegrating tablet Take 1 tablet (4 mg total) by mouth every 8 (eight) hours as needed for nausea or vomiting. 06/09/17   Varney Biles, MD  Oxycodone HCl 10 MG TABS Take 10 mg by mouth 3 (three) times daily as needed (for pain).  04/18/17   [provider]  pantoprazole (PROTONIX) 40 MG tablet Take 1 tablet (40 mg total) by mouth 2 (two) times daily.  06/18/17   Barton Dubois, MD  potassium chloride SA (K-DUR,KLOR-CON) 20 MEQ tablet Take 20 mEq by mouth daily.    [provider]  sertraline (ZOLOFT) 50 MG tablet Take 50 mg by mouth daily.    [provider]  sitaGLIPtin (JANUVIA) 100 MG tablet Take 100 mg by mouth daily.    [provider]  tiZANidine (ZANAFLEX) 2 MG tablet Take 2 mg by mouth every 6 (six) hours as needed for muscle spasms.    [provider]  traZODone (DESYREL) 50 MG tablet Take 1 tablet (50 mg total) by mouth at bedtime. 12/15/16   Florinda Marker, MD    Family History Family History  Problem Relation Age of Onset  . Cancer Father   . Hypertension Mother   . Diabetes Mother   . Breast cancer Mother   .  Hypertension Brother   . Diabetes Brother   . Hypertension Sister   . Diabetes Sister     Social History Social History  Substance Use Topics  . Smoking status: Never Smoker  . Smokeless tobacco: Never Used  . Alcohol use No     Allergies   Coconut flavor [flavoring agent]; Coconut oil; and Ibuprofen   Review of Systems Review of Systems  Constitutional: Negative for chills, diaphoresis, fatigue and fever.  HENT: Negative for rhinorrhea.   Eyes: Negative for visual disturbance.  Respiratory: Positive for chest tightness and shortness of breath. Negative for cough, hemoptysis, sputum production, choking, wheezing and stridor.   Cardiovascular: Positive for chest pain and palpitations. Negative for leg swelling (bilateral), syncope and near-syncope.  Gastrointestinal: Positive for abdominal pain. Negative for constipation, diarrhea, nausea and vomiting.  Genitourinary: Negative for dysuria and flank pain.  Musculoskeletal: Negative for back pain, neck pain and neck stiffness.  Neurological: Positive for light-headedness. Negative for headaches.  Psychiatric/Behavioral: Negative for agitation.  All other systems reviewed and are negative.    Physical Exam Updated  Vital Signs BP 128/67   Pulse 76   Temp 97.9 F (36.6 C) (Oral)   Resp (!) 23   Ht 5\' 10"  (1.778 m)   Wt (!) 139.3 kg (307 lb)   SpO2 97%   BMI 44.05 kg/m   Physical Exam  Constitutional: He is oriented to person, place, and time. He appears well-developed and well-nourished. No distress.  HENT:  Head: Normocephalic and atraumatic.  Mouth/Throat: Oropharynx is clear and moist. No oropharyngeal exudate.  Eyes: Pupils are equal, round, and reactive to light. Conjunctivae and EOM are normal.  Neck: Normal range of motion. Neck supple.  Cardiovascular: Normal rate.   No murmur heard. Pulmonary/Chest: Effort normal and breath sounds normal. No stridor. No respiratory distress. He has no wheezes. He has no rales. He exhibits tenderness.  Abdominal: Soft. There is tenderness (epigastric).  Musculoskeletal: He exhibits no edema or tenderness.  Neurological: He is alert and oriented to person, place, and time. No cranial nerve deficit or sensory deficit. He exhibits normal muscle tone.  Skin: Skin is warm and dry. Capillary refill takes less than 2 seconds. He is not diaphoretic. No erythema. No pallor.  Psychiatric: He has a normal mood and affect.  Nursing note and vitals reviewed.    ED Treatments / Results  Labs (all labs ordered are listed, but only abnormal results are displayed) Labs Reviewed  BASIC METABOLIC PANEL - Abnormal; Notable for the following:       Result Value   Sodium 133 (*)    Chloride 96 (*)    Glucose, Bld 217 (*)    Calcium 8.5 (*)    All other components within normal limits  HEPATIC FUNCTION PANEL - Abnormal; Notable for the following:    Total Protein 6.4 (*)    Alkaline Phosphatase 37 (*)    All other components within normal limits  CBC - Abnormal; Notable for the following:    RBC 4.01 (*)    Hemoglobin 11.3 (*)    HCT 35.5 (*)    All other components within normal limits  LIPASE, BLOOD  BRAIN NATRIURETIC PEPTIDE  D-DIMER, QUANTITATIVE (NOT  AT [facility])  RAPID URINE DRUG SCREEN, HOSP PERFORMED  HEMOGLOBIN A1C  LIPID PANEL  TROPONIN I  TROPONIN I  TROPONIN I  I-STAT TROPONIN, ED    EKG  EKG Interpretation  Date/Time:  Thursday June 29 2017 20:37:56 EDT Ventricular Rate:  73 PR Interval:    QRS Duration: 88 QT Interval:  402 QTC Calculation: 443 R Axis:   25 Text Interpretation:  Sinus rhythm Borderline T abnormalities, inferior leads when compared to prior, no significant changes. No STEMI Confirmed by Antony Blackbird [phone]) on 06/29/2017 10:22:35 PM       Radiology Dg Chest 2 View  Result Date: 06/29/2017 CLINICAL DATA:  Left chest pain radiating to the left arm EXAM: CHEST  2 VIEW COMPARISON:  May 05, 2017 FINDINGS: The mediastinal contour is normal. The heart size is enlarged. There is no focal infiltrate, pulmonary edema, or pleural effusion. Minimal atelectasis of right lung base is identified. Degenerative joint changes of the spine are noted. IMPRESSION: No focal pneumonia or pulmonary edema. Cardiomegaly. Electronically Signed   By: Abelardo Diesel M.D.   On: 06/29/2017 21:23    Procedures Procedures (including critical care time)  Medications Ordered in ED Medications  famotidine (PEPCID) tablet 40 mg (not administered)  Insulin Detemir (LEVEMIR) FlexPen 20 Units (not administered)  pantoprazole (PROTONIX) EC tablet 40 mg (not administered)  tiZANidine (ZANAFLEX) tablet 2 mg (not administered)  pregabalin (LYRICA) capsule 75 mg (not administered)  oxyCODONE (Oxy IR/ROXICODONE) immediate release tablet 10 mg (not administered)  atorvastatin (LIPITOR) tablet 80 mg (not administered)  carvedilol (COREG) tablet 25 mg (not administered)  furosemide (LASIX) tablet 40 mg (not administered)  nitroGLYCERIN (NITROSTAT) SL tablet 0.4 mg (not administered)  traZODone (DESYREL) tablet 50 mg (not administered)  hydrALAZINE (APRESOLINE) tablet 50 mg (not administered)  isosorbide dinitrate (ISORDIL) tablet 30 mg  (not administered)  clopidogrel (PLAVIX) tablet 75 mg (not administered)  sertraline (ZOLOFT) tablet 50 mg (not administered)  HYDROmorphone (DILAUDID) injection 1 mg (not administered)  aspirin chewable tablet 81 mg (not administered)  0.9 %  sodium chloride infusion (not administered)  insulin aspart (novoLOG) injection 0-9 Units (not administered)  acetaminophen (TYLENOL) tablet 650 mg (not administered)  ondansetron (ZOFRAN) injection 4 mg (not administered)  zolpidem (AMBIEN) tablet 5 mg (not administered)  hydrALAZINE (APRESOLINE) injection 5 mg (not administered)  morphine 4 MG/ML injection 4 mg (4 mg Intravenous Given 06/29/17 2317)     Initial Impression / Assessment and Plan / ED Course  I have reviewed the triage vital signs and the nursing notes.  Pertinent labs & imaging results that were available during my care of the patient were reviewed by me and considered in my medical decision making (see chart for details).     [name] is a 56 y.o. male with a past medical history significant for diabetes, hypertension, hyperlipidemia, prior DVT/PE, stroke, on Plavix, and CAD with PCI who presents with chest pain. Patient reports that he has a history of chronic chest pain but says one hour ago he had onset of severe chest pain that is different and chronic pain. He describes a central chest pressure and feels like a weight in his central chest. He reports the pain radiates towards his left neck and down his left arm with numbness. He says that this is how he felt when his prior heart attack was taking place. He reports lightheadedness and associated shortness of breath. His shortness of breath has improved since onset. He describes palpitations but denies any diaphoresis, nausea, vomiting, or syncope. He denies recent reticulocyte injuries. He denies productive cough but does report some mild congestion. He denies any new leg pain or leg swelling. He reports no urinary symptoms,  constipation, or diarrhea. He describes his chest pain as an  8 out of 10 severity currently.  Pt was given aspirin and nitroglycerin by EMS with some improvement in discomfort.  On exam, lungs are clear. Chest is tender to palpation as is the central upper epigastrium. Patient has normal sensation in all x-rays. Symmetric pulses in upper extremity. Patient has no focal neurologic deficits on my exam. Legs are slightly edematous bilaterally.  As patient reports his pain is the same as is prior heart attack,patient will need diagnostic testing to rule this out. Anticipate admission for high risk chest pain after workup.  Initial laboratory testing is reassuring. Initial troponin negative. Given patient's history, patient will be admitted for high risk chest pain.    Final Clinical Impressions(s) / ED Diagnoses   Final diagnoses:  Precordial chest pain   Clinical Impression: 1. Precordial chest pain     Disposition: Admit to Hospitalist service    Tegeler, Gwenyth Allegra, MD 06/30/17 1036

## 2017-06-29 NOTE — H&P (Addendum)
History and Physical    CARRIE SCHOONMAKER LPF:[id number] DOB: 1961-05-22 DOA: 06/29/2017  Referring MD/NP/PA:   PCP: Rogers Blocker, MD   Patient coming from:  The patient is coming from home.  At baseline, pt is independent for most of ADL.  Chief Complaint: chest pain  HPI: [name] is a 56 y.o. male with medical history significant of hypertension, hyperlipidemia, diabetes mellitus, stroke, depression, PUD, DVT, PE, OSA on CPAP, CAD, stent placement, chronic back pain, chronic abdominal pain and diarrhea due to IBS, who presents with chest pain.  Patient states that his chest pain started at about 7 PM. It is located in the left central chest, pressure-like, 10 out of 10 in severity, radiating to left arm. The chest pain has been persistent since it started. It is associated with SOB and dizziness. It is not pleuritic, not aggravated by deep breath. Patient states that he has mild tenderness in the left calf area, no recent long distant traveling. He has some mild cough, but no fever or chills. Patient states that he has chronic abdominal pain and diarrhea due to IBS, which has not changed significantly. Patient does not have nausea or vomiting, symptoms of UTI or unilateral weakness.  ED Course: pt was found to have negative troponin, lipase is 32, WBC 8.8, electrolytes renal function okay, temperature normal, no tachycardia, oxygen sat 95% on room air, negative chest x-ray. Patient is placed on telemetry bed for observation.  Review of Systems:   General: no fevers, chills, no body weight gain, has fatigue HEENT: no blurry vision, hearing changes or sore throat Respiratory: has dyspnea, coughing, no heezing CV: has chest pain, no palpitations GI: no nausea, vomiting, has dominal pain, diarrhea, no onstipation GU: no dysuria, burning on urination, increased urinary frequency, hematuria  Ext: no leg edema Neuro: no unilateral weakness, numbness, or tingling, no vision change or  hearing loss Skin: no rash, no skin tear. MSK: No muscle spasm, no deformity, no limitation of range of movement in spin Heme: No easy bruising.  Travel history: No recent long distant travel.  Allergy:  Allergies  Allergen Reactions  . Coconut Flavor [Flavoring Agent] Hives  . Coconut Oil Hives  . Ibuprofen Other (See Comments)    Made gastric ulcers worse    Past Medical History:  Diagnosis Date  . Arthritis   . Back pain   . CAD (coronary artery disease)    a. s/p DES to LAD in 05/2016  . DVT (deep venous thrombosis) ([place])   . Hyperlipidemia   . Hypertension   . IBS (irritable bowel syndrome)   . OSA (obstructive sleep apnea)   . Pancreatitis   . PE (pulmonary thromboembolism) ([place])   . PUD (peptic ulcer disease)   . Renal disorder   . Stroke ([place])   . Type 2 diabetes mellitus ([place])     Past Surgical History:  Procedure Laterality Date  . CARDIAC CATHETERIZATION N/A 05/31/2016   Procedure: Left Heart Cath and Coronary Angiography;  Surgeon: Peter M Martinique, MD;  Location: [facility];  Service: Cardiovascular;  Laterality: N/A;  . CARDIAC CATHETERIZATION N/A 05/31/2016   Procedure: Intravascular Pressure Wire/FFR Study;  Surgeon: Peter M Martinique, MD;  Location: [facility];  Service: Cardiovascular;  Laterality: N/A;  . CARDIAC CATHETERIZATION N/A 05/31/2016   Procedure: Coronary Stent Intervention;  Surgeon: Peter M Martinique, MD;  Location: [facility];  Service: Cardiovascular;  Laterality: N/A;  . LEFT HEART CATHETERIZATION  WITH CORONARY ANGIOGRAM N/A 02/03/2014   Procedure: LEFT HEART CATHETERIZATION WITH CORONARY ANGIOGRAM;  Surgeon: Pixie Casino, MD;  Location: [facility];  Service: Cardiovascular;  Laterality: N/A;  . left leg stent       Social History:  reports that he has never smoked. He has never used smokeless tobacco. He reports that he does not drink alcohol or use drugs.  Family History:  Family History  Problem Relation Age of  Onset  . Cancer Father   . Hypertension Mother   . Diabetes Mother   . Breast cancer Mother   . Hypertension Brother   . Diabetes Brother   . Hypertension Sister   . Diabetes Sister      Prior to Admission medications   Medication Sig Start Date End Date Taking? Authorizing Provider  aspirin 81 MG chewable tablet Chew 1 tablet (81 mg total) by mouth daily. 06/01/16  Yes Elwin Mocha, MD  atorvastatin (LIPITOR) 80 MG tablet Take 1 tablet (80 mg total) by mouth daily. 01/26/17  Yes Strader, Fransisco Hertz, PA-C  carvedilol (COREG) 25 MG tablet Take 1 tablet (25 mg total) by mouth 2 (two) times daily with a meal. 01/26/17  Yes Strader, [place] M, PA-C  clopidogrel (PLAVIX) 75 MG tablet Take 75 mg by mouth daily.   Yes [provider]  famotidine (PEPCID) 40 MG tablet Take 1 tablet (40 mg total) by mouth at bedtime. Patient taking differently: Take 40 mg by mouth daily.  06/18/17  Yes Barton Dubois, MD  furosemide (LASIX) 40 MG tablet Take 1 tablet (40 mg total) by mouth 2 (two) times daily. 01/26/17  Yes Strader, [place] M, PA-C  glipiZIDE (GLUCOTROL) 10 MG tablet Take 10 mg by mouth 2 (two) times daily. 05/30/17  Yes [provider]  hydrALAZINE (APRESOLINE) 50 MG tablet Take 50 mg by mouth 2 (two) times daily.   Yes [provider]  Insulin Detemir (LEVEMIR) 100 UNIT/ML Pen Inject 20 Units into the skin daily at 10 pm. 06/18/17  Yes Barton Dubois, MD  isosorbide dinitrate (ISORDIL) 30 MG tablet Take 1 tablet (30 mg total) by mouth 2 (two) times daily. Patient taking differently: Take 30 mg by mouth every morning.  11/22/16  Yes Strader, [place] M, PA-C  LYRICA 75 MG capsule Take 75 mg by mouth 2 (two) times daily. 04/03/17  Yes [provider]  metFORMIN (GLUCOPHAGE-XR) 500 MG 24 hr tablet Take 1,000 mg by mouth 2 (two) times daily. 04/14/17  Yes [provider]  nitroGLYCERIN (NITROSTAT) 0.4 MG SL tablet Place 0.4 mg under the tongue every 5 (five)  minutes as needed for chest pain. 12/17/16  Yes [provider]  ondansetron (ZOFRAN ODT) 4 MG disintegrating tablet Take 1 tablet (4 mg total) by mouth every 8 (eight) hours as needed for nausea or vomiting. 06/09/17  Yes Varney Biles, MD  Oxycodone HCl 10 MG TABS Take 10 mg by mouth 2 (two) times daily as needed (for pain).  04/18/17  Yes [provider]  pantoprazole (PROTONIX) 40 MG tablet Take 1 tablet (40 mg total) by mouth 2 (two) times daily. 06/18/17  Yes Barton Dubois, MD  potassium chloride SA (K-DUR,KLOR-CON) 20 MEQ tablet Take 20 mEq by mouth daily.   Yes [provider]  sertraline (ZOLOFT) 50 MG tablet Take 50 mg by mouth daily.   Yes [provider]  sitaGLIPtin (JANUVIA) 100 MG tablet Take 100 mg by mouth daily.   Yes [provider]  tiZANidine (ZANAFLEX) 2 MG tablet Take 2 mg by mouth every 6 (six) hours as needed for muscle spasms.   Yes [provider]  traZODone (DESYREL) 50 MG tablet Take 1 tablet (50 mg total) by mouth at bedtime. 12/15/16  Yes Burns, Arloa Koh, MD  Insulin Pen Needle 31G X 5 MM MISC Use 1 needle daily to inject insulin as prescribed 06/18/17   Barton Dubois, MD  Omega-3 Fatty Acids (FISH OIL CONCENTRATE) 1000 MG CAPS Take 2 capsules (2,000 mg total) by mouth daily. Patient not taking: Reported on 06/29/2017 06/18/17   Barton Dubois, MD    Physical Exam: Vitals:   06/29/17 2100 06/29/17 2145 06/29/17 2245 06/29/17 2300  BP: 118/78 (!) 142/69 (!) 146/67 (!) 106/48  Pulse: 73 74 72   Resp: (!) 25 (!) 25 (!) 21 (!) 23  Temp:      TempSrc:      SpO2: 97% 95% 95%   Weight:      Height:       General: Not in acute distress HEENT:       Eyes: PERRL, EOMI, no scleral icterus.       ENT: No discharge from the ears and nose, no pharynx injection, no tonsillar enlargement.        Neck: No JVD, no bruit, no mass felt. Heme: No neck lymph node enlargement. Cardiac: S1/S2, RRR, No murmurs, No gallops or  rubs. Respiratory: No rales, wheezing, rhonchi or rubs. GI: Soft, nondistended, has tender in right middle quadrant, no rebound pain, no organomegaly, BS present. GU: No hematuria Ext: No pitting leg edema bilaterally. 2+DP/PT pulse bilaterally. Musculoskeletal: No joint deformities, No joint redness or warmth, no limitation of ROM in spin. Skin: No rashes.  Neuro: Alert, oriented X3, cranial nerves II-XII grossly intact, moves all extremities normally. Psych: Patient is not psychotic, no suicidal or hemocidal ideation.  Labs on Admission: I have personally reviewed following labs and imaging studies  CBC:  Recent Labs Lab 06/29/17 2254  WBC 8.8  HGB 11.3*  HCT 35.5*  MCV 88.5  PLT 737   Basic Metabolic Panel:  Recent Labs Lab 06/29/17 2044  NA 133*  K 4.3  CL 96*  CO2 23  GLUCOSE 217*  BUN 8  CREATININE 0.79  CALCIUM 8.5*   GFR: Estimated Creatinine Clearance: 150.3 mL/min (by C-G formula based on SCr of 0.79 mg/dL). Liver Function Tests:  Recent Labs Lab 06/29/17 2107  AST 31  ALT 22  ALKPHOS 37*  BILITOT 0.7  PROT 6.4*  ALBUMIN 3.7    Recent Labs Lab 06/29/17 2107  LIPASE 32   No results for input(s): AMMONIA in the last 168 hours. Coagulation Profile: No results for input(s): INR, PROTIME in the last 168 hours. Cardiac Enzymes: No results for input(s): CKTOTAL, CKMB, CKMBINDEX, TROPONINI in the last 168 hours. BNP (last 3 results) No results for input(s): PROBNP in the last 8760 hours. HbA1C: No results for input(s): HGBA1C in the last 72 hours. CBG: No results for input(s): GLUCAP in the last 168 hours. Lipid Profile: No results for input(s): CHOL, HDL, LDLCALC, TRIG, CHOLHDL, LDLDIRECT in the last 72 hours. Thyroid Function Tests: No results for input(s): TSH, T4TOTAL, FREET4, T3FREE, THYROIDAB in the last 72 hours. Anemia Panel: No results for input(s): VITAMINB12, FOLATE, FERRITIN, TIBC, IRON, RETICCTPCT in the last 72 hours. Urine  analysis:    Component Value Date/Time   COLORURINE COLORLESS (A) 06/17/2017 0131   APPEARANCEUR CLEAR 06/17/2017 0131   LABSPEC  1.002 (L) 06/17/2017 0131   PHURINE 5.0 06/17/2017 0131   GLUCOSEU >=500 (A) 06/17/2017 0131   HGBUR NEGATIVE 06/17/2017 0131   BILIRUBINUR NEGATIVE 06/17/2017 0131   KETONESUR NEGATIVE 06/17/2017 0131   PROTEINUR NEGATIVE 06/17/2017 0131   UROBILINOGEN 0.2 05/27/2014 2312   NITRITE NEGATIVE 06/17/2017 0131   LEUKOCYTESUR NEGATIVE 06/17/2017 0131   Sepsis Labs: @LABRCNTIP (procalcitonin:4,lacticidven:4) )No results found for this or any previous visit (from the past 240 hour(s)).   Radiological Exams on Admission: Dg Chest 2 View  Result Date: 06/29/2017 CLINICAL DATA:  Left chest pain radiating to the left arm EXAM: CHEST  2 VIEW COMPARISON:  May 05, 2017 FINDINGS: The mediastinal contour is normal. The heart size is enlarged. There is no focal infiltrate, pulmonary edema, or pleural effusion. Minimal atelectasis of right lung base is identified. Degenerative joint changes of the spine are noted. IMPRESSION: No focal pneumonia or pulmonary edema. Cardiomegaly. Electronically Signed   By: Abelardo Diesel M.D.   On: 06/29/2017 21:23     EKG: Independently reviewed. Sinus rhythm, QTC 443, T-wave flattening, poor R-wave progression, Q wave in lead 3/aVF.  Assessment/Plan Principal Problem:   Chest pain Active Problems:   Dyslipidemia   HTN (hypertension)   CAD (coronary artery disease)   Type 2 diabetes mellitus with vascular disease (HCC)   Depression   Cerebral embolism with cerebral infarction   GERD (gastroesophageal reflux disease)   Chest pain and hx of CAD: Patient seems to have atypical chest pain. His chest pain has been persistent. Given history of CAD, myocardial infarction and s/p of stent placement, very concerning for unstable angina. Patient has history of DVT and PE, now has mild left calf tenderness, will also need to r/o PE.   - will  place on Tele bed for obs - start IV heparin  - cycle CE q6 x3 and repeat EKG in the am  - prn Nitroglycerin, dilaudid, and aspirin, lipitor, Lipitor - Risk factor stratification: will UDS (pt had A1c 9.7 and LDL 2 on 06/18/17) - 2d echo - Stat D-dimer, if positive, will get CTA to r/o PE - LE doppler to r/o DVT - Inpatient non-urgent consult order was put in Epic and message to Birdie Sons was sent out.  Addendum: D-dimer positive 0.75-->will get CTA to r/o PE.  Hx of stroke: -continue Plavix, aspirin, Lipitor  HLD: -lipitor  HTN (hypertension): -Continue Coreg, Lasix -IV hydralazine when necessary  Depression: Stable, no suicidal or homicidal ideations. -Continue home medications: Zoloft  GERD and hx of PUD -Protonix and Pepcid  Type 2 diabetes mellitus with vascular disease ([place]): Last A1c 9.7 on 06/19/15 , poorly controled. Patient is taking glipizide, metformin, Januvia, Levemir at home -will decrease Levemir dose from  20-15 units daily -SSI  OSA: -CPAP  DVT ppx: On IV Heparin  Code Status: Full code Family Communication: None at bed side.   Disposition Plan:  Anticipate discharge back to previous home environment Consults called:  none Admission status: Obs / tele    Date of Service 06/29/2017    Ivor Costa [facility] Pager [phone]  If 7PM-7AM, please contact night-coverage [email or web address] Password [email or web address] 06/29/2017, 11:38 PM

## 2017-06-29 NOTE — Progress Notes (Addendum)
ANTICOAGULATION CONSULT NOTE - Initial Consult  Pharmacy Consult for heparin  Indication: chest pain/ACS  Allergies  Allergen Reactions  . Coconut Flavor [Flavoring Agent] Hives  . Coconut Oil Hives  . Ibuprofen Other (See Comments)    Made gastric ulcers worse    Patient Measurements: Height: 5\' 10"  (177.8 cm) Weight: (!) 307 lb (139.3 kg) IBW/kg (Calculated) : 73 Heparin Dosing Weight: 105.7 kg   Vital Signs: Temp: 97.9 F (36.6 C) (09/06 2041) Temp Source: Oral (09/06 2041) BP: 146/67 (09/06 2245) Pulse Rate: 72 (09/06 2245)  Labs:  Recent Labs  06/29/17 2044 06/29/17 2254  HGB  --  11.3*  HCT  --  35.5*  PLT  --  235  CREATININE 0.79  --     Estimated Creatinine Clearance: 150.3 mL/min (by C-G formula based on SCr of 0.79 mg/dL).   Medical History: Past Medical History:  Diagnosis Date  . Arthritis   . Back pain   . CAD (coronary artery disease)    a. s/p DES to LAD in 05/2016  . DVT (deep venous thrombosis) ([place])   . Hyperlipidemia   . Hypertension   . IBS (irritable bowel syndrome)   . OSA (obstructive sleep apnea)   . Pancreatitis   . PE (pulmonary thromboembolism) ([place])   . PUD (peptic ulcer disease)   . Renal disorder   . Stroke ([place])   . Type 2 diabetes mellitus [facility])    Assessment: 56 yo male admitted with chest pain. Pharmacy consulted to dose heparin. No oral anticoagulation PTA. Hgb 11.3 and platelets normal. No s/s bleeding noted.   Noted recent admission for possible TIA work-up (8/26). Per neurology notes from that admission, possible TIA vs. cervical radiculopathy. No acute findings on head CT/MRI from 8/25.   Patient also with history of DVT/PE and previously on Xarelto. He reports not taking since some time in early 2017.   Goal of Therapy:  Heparin level 0.3-0.7 units/ml Monitor platelets by anticoagulation protocol: Yes   Plan:  Bolus heparin 4000 units/hr  Start heparin gtt at 1350 units/hr  Heparin level in 6 hrs Daily  heparin level and CBC Monitor for s/s bleeding  Argie Ramming, PharmD Clinical Pharmacist 06/29/17 11:32 PM

## 2017-06-29 NOTE — ED Triage Notes (Signed)
Pt from home via [facility]. Pt c/o L sided CP that radiates down to L arm. Rates pain @ 8/10 sharp & pressure. Endorses SHOB, weakness, dizziness, & back pain. Denies n/v. Given 324 ASA & 1 NTG PTA by EMS. Pt A&O x4 on arrival.

## 2017-06-30 ENCOUNTER — Encounter: Payer: Medicaid Other | Admitting: Internal Medicine

## 2017-06-30 ENCOUNTER — Observation Stay (HOSPITAL_COMMUNITY): Payer: Medicaid Other

## 2017-06-30 ENCOUNTER — Observation Stay (HOSPITAL_BASED_OUTPATIENT_CLINIC_OR_DEPARTMENT_OTHER): Payer: Medicaid Other

## 2017-06-30 ENCOUNTER — Encounter (HOSPITAL_COMMUNITY): Payer: Self-pay | Admitting: *Deleted

## 2017-06-30 DIAGNOSIS — I2510 Atherosclerotic heart disease of native coronary artery without angina pectoris: Secondary | ICD-10-CM | POA: Diagnosis not present

## 2017-06-30 DIAGNOSIS — R0789 Other chest pain: Secondary | ICD-10-CM | POA: Diagnosis not present

## 2017-06-30 DIAGNOSIS — I6310 Cerebral infarction due to embolism of unspecified precerebral artery: Secondary | ICD-10-CM | POA: Diagnosis not present

## 2017-06-30 DIAGNOSIS — I10 Essential (primary) hypertension: Secondary | ICD-10-CM | POA: Diagnosis not present

## 2017-06-30 DIAGNOSIS — K219 Gastro-esophageal reflux disease without esophagitis: Secondary | ICD-10-CM | POA: Diagnosis not present

## 2017-06-30 DIAGNOSIS — R072 Precordial pain: Secondary | ICD-10-CM | POA: Diagnosis not present

## 2017-06-30 DIAGNOSIS — E785 Hyperlipidemia, unspecified: Secondary | ICD-10-CM | POA: Diagnosis not present

## 2017-06-30 DIAGNOSIS — E1159 Type 2 diabetes mellitus with other circulatory complications: Secondary | ICD-10-CM | POA: Diagnosis not present

## 2017-06-30 DIAGNOSIS — F329 Major depressive disorder, single episode, unspecified: Secondary | ICD-10-CM

## 2017-06-30 LAB — ECHOCARDIOGRAM COMPLETE
Height: 70 in
Weight: 5036.8 oz

## 2017-06-30 LAB — GLUCOSE, CAPILLARY
Glucose-Capillary: 176 mg/dL — ABNORMAL HIGH (ref 65–99)
Glucose-Capillary: 172 mg/dL — ABNORMAL HIGH (ref 65–99)

## 2017-06-30 LAB — RAPID URINE DRUG SCREEN, HOSP PERFORMED
Amphetamines: NOT DETECTED
Barbiturates: NOT DETECTED
Benzodiazepines: NOT DETECTED
Cocaine: NOT DETECTED
Opiates: POSITIVE — AB
Tetrahydrocannabinol: NOT DETECTED

## 2017-06-30 LAB — TROPONIN I
Troponin I: <0.03 ng/mL (ref <0.03)
Troponin I: <0.03 ng/mL (ref <0.03)

## 2017-06-30 LAB — D-DIMER, QUANTITATIVE (NOT AT ARMC): D-Dimer, Quant: 0.73 ug/mL-FEU — ABNORMAL HIGH (ref 0.00–0.50)

## 2017-06-30 MED ORDER — IOPAMIDOL (ISOVUE-370) INJECTION 76%
INTRAVENOUS | Status: AC
  Administered 2017-06-30: 100 mL
  Filled 2017-06-30: qty 100

## 2017-06-30 MED ORDER — METFORMIN HCL ER 500 MG PO TB24: 1000.0000 mg | ORAL_TABLET | Freq: Two times a day (BID) | ORAL | Status: DC

## 2017-06-30 NOTE — Progress Notes (Signed)
Patient D-Dimer resulted at 0.73.  RN text paged [facility] with this information.

## 2017-06-30 NOTE — Progress Notes (Signed)
  Echocardiogram 2D Echocardiogram has been performed.  [name] 06/30/2017, 12:45 PM

## 2017-06-30 NOTE — Discharge Summary (Signed)
Physician Discharge Summary  [name]:[id number] DOB: 1961/05/17 DOA: 06/29/2017  PCP: Rogers Blocker, MD  Admit date: 06/29/2017 Discharge date: 06/30/2017  Time spent: 35 minutes  Recommendations for Outpatient Follow-up:  1. Repeat BMET to follow electrolyte and renal function 2. Reassess patient CBG's and adjust hypoglycemic regimen  3. Follow resolution/stability of chornic pain and make sure patient has follow up with provider for further assessment on his cervical radiculopathy. 4. Needs aggressive intervention on his obesity, make referral to bariatric clinic    Discharge Diagnoses:  Principal Problem:   Chest pain Active Problems:   Dyslipidemia   HTN (hypertension)   CAD (coronary artery disease)   Type 2 diabetes mellitus with vascular disease ([place])   Depression   Cerebral embolism with cerebral infarction   GERD (gastroesophageal reflux disease)   Discharge Condition: stable and improved. Discharge home with instructions to follow up with PCP.  Diet recommendation: heart healthy, modified carbohydrates and low calorie diet   Filed Weights   06/29/17 2042 06/30/17 0225  Weight: (!) 139.3 kg (307 lb) (!) 142.8 kg (314 lb 12.8 oz)    History of present illness:  As per H&P written by Dr. Blaine Hamper on 06/29/17 56 y.o. male with medical history significant of hypertension, hyperlipidemia, diabetes mellitus, stroke, depression, PUD, DVT, PE, OSA on CPAP, CAD, stent placement, chronic back pain, chronic abdominal pain and diarrhea due to IBS, who presents with chest pain.  Patient states that his chest pain started at about 7 PM. It is located in the left central chest, pressure-like, 10 out of 10 in severity, radiating to left arm. The chest pain has been persistent since it started. It is associated with SOB and dizziness. It is not pleuritic, not aggravated by deep breath. Patient states that he has mild tenderness in the left calf area, no recent long distant  traveling. He has some mild cough, but no fever or chills. Patient states that he has chronic abdominal pain and diarrhea due to IBS, which has not changed significantly. Patient does not have nausea or vomiting, symptoms of UTI or unilateral weakness.  Hospital Course:  1-chest pain: atypical in presentation. But in a patient with significant risk factors. (Heart score is 5) -still having some discomfort in left chest, but improved. Pain is reproducible on palpation and with movement on his left arm. -EKG w/o ischemic process and reassuring telemetry tracing. -patient's troponin neg X 3 -case discussed with cardiology who recommended continue current medication therapy and not to pursuit any further ischemic work up, as his pain is most likely MSK or due to known radiculopathy. -patient lipase and LFT's were normal and had a CTA that r/o PE or any other acute pulmonary process. -continue ASA, plavix, lipitor and coreg  2-type 2 diabetes: uncontrolled -patient reported that he was placed back on glipizide as well -will continue home hypoglycemic regimen -advise to follow low carb diet and to take medications as prescribed   3-morbid obesity -Body mass index is 46.63 kg/m. -low calorie diet and exercise recommended -will benefit of evaluation in bariatric clinic   4-GERD -continue PPI and pepcid  5-depression -continue trazodone and zoloft  6-HLD/elevated TG -will continue statins   7-radiculopathy/chronic pain -continue oxycodone and lyrica  -follow up with PCP and per patient already with upcoming appointment for outpatient follow up.  8-HTN -will continue current antihypertensive regimen -patient advise to follow heart healthy diet   Procedures:  See below for x-ray reports   Consultations:  Cardiology   Discharge Exam: Vitals:   06/30/17 0912 06/30/17 1010  BP: 116/67 105/71  Pulse:  80  Resp:  19  Temp:    SpO2:  98%    General: afebrile, no SOB, no  nausea, no vomiting. Patient with reproducible chest discomfort on left chest on palpation and while moving left arm. Most likely MSK and/or associated with known radiculopathy problem.   Cardiovascular: S1 and S2, no rubs, no gallops. No JVD Respiratory: CTA bilaterally Abd: soft, no tender, positive BS, no guarding  Extremities: trace edema bilaterally, no cyanosis or clubbing     Discharge Instructions   Discharge Instructions    Diet - low sodium heart healthy    Complete by:  As directed    Discharge instructions    Complete by:  As directed    Follow up with PCP in 2 weeks Take medications as prescribed Follow low calorie, heart healthy and modified carbohydrates diet  Maintain adequate hydration.     Current Discharge Medication List    CONTINUE these medications which have CHANGED   Details  metFORMIN (GLUCOPHAGE-XR) 500 MG 24 hr tablet Take 2 tablets (1,000 mg total) by mouth 2 (two) times daily.      CONTINUE these medications which have NOT CHANGED   Details  aspirin 81 MG chewable tablet Chew 1 tablet (81 mg total) by mouth daily. Qty: 30 tablet, Refills: 0    atorvastatin (LIPITOR) 80 MG tablet Take 1 tablet (80 mg total) by mouth daily. Qty: 90 tablet, Refills: 3   Associated Diagnoses: Encounter for pre-operative cardiovascular clearance    carvedilol (COREG) 25 MG tablet Take 1 tablet (25 mg total) by mouth 2 (two) times daily with a meal. Qty: 180 tablet, Refills: 3    clopidogrel (PLAVIX) 75 MG tablet Take 75 mg by mouth daily.    famotidine (PEPCID) 40 MG tablet Take 1 tablet (40 mg total) by mouth at bedtime. Qty: 30 tablet, Refills: 2    furosemide (LASIX) 40 MG tablet Take 1 tablet (40 mg total) by mouth 2 (two) times daily. Qty: 180 tablet, Refills: 3    glipiZIDE (GLUCOTROL) 10 MG tablet Take 10 mg by mouth 2 (two) times daily. Refills: 4    hydrALAZINE (APRESOLINE) 50 MG tablet Take 50 mg by mouth 2 (two) times daily.    Insulin Detemir  (LEVEMIR) 100 UNIT/ML Pen Inject 20 Units into the skin daily at 10 pm. Qty: 15 mL, Refills: 3    isosorbide dinitrate (ISORDIL) 30 MG tablet Take 1 tablet (30 mg total) by mouth 2 (two) times daily. Qty: 60 tablet, Refills: 4    LYRICA 75 MG capsule Take 75 mg by mouth 2 (two) times daily. Refills: 2    nitroGLYCERIN (NITROSTAT) 0.4 MG SL tablet Place 0.4 mg under the tongue every 5 (five) minutes as needed for chest pain. Refills: 4    ondansetron (ZOFRAN ODT) 4 MG disintegrating tablet Take 1 tablet (4 mg total) by mouth every 8 (eight) hours as needed for nausea or vomiting. Qty: 20 tablet, Refills: 0    Oxycodone HCl 10 MG TABS Take 10 mg by mouth 2 (two) times daily as needed (for pain).  Refills: 0    pantoprazole (PROTONIX) 40 MG tablet Take 1 tablet (40 mg total) by mouth 2 (two) times daily. Qty: 60 tablet, Refills: 1    potassium chloride SA (K-DUR,KLOR-CON) 20 MEQ tablet Take 20 mEq by mouth daily.    sertraline (ZOLOFT) 50 MG tablet  Take 50 mg by mouth daily.    sitaGLIPtin (JANUVIA) 100 MG tablet Take 100 mg by mouth daily.    tiZANidine (ZANAFLEX) 2 MG tablet Take 2 mg by mouth every 6 (six) hours as needed for muscle spasms.    traZODone (DESYREL) 50 MG tablet Take 1 tablet (50 mg total) by mouth at bedtime. Qty: 30 tablet, Refills: 11   Associated Diagnoses: Depression, unspecified depression type    Insulin Pen Needle 31G X 5 MM MISC Use 1 needle daily to inject insulin as prescribed Qty: 100 each, Refills: 2    Omega-3 Fatty Acids (FISH OIL CONCENTRATE) 1000 MG CAPS Take 2 capsules (2,000 mg total) by mouth daily. Qty: 60 capsule, Refills: 2       Allergies  Allergen Reactions  . Coconut Flavor [Flavoring Agent] Hives  . Coconut Oil Hives  . Ibuprofen Other (See Comments)    Made gastric ulcers worse   Follow-up Information    Rogers Blocker, MD. Schedule an appointment as soon as possible for a visit in 2 week(s).   Specialty:  Internal  Medicine Contact information: [address] [place] [address] [phone]           The results of significant diagnostics from this hospitalization (including imaging, microbiology, ancillary and laboratory) are listed below for reference.    Significant Diagnostic Studies: Ct Angio Head W Or Wo Contrast  Result Date: 06/17/2017 CLINICAL DATA:  56 year old male code stroke presentation with left side weakness. EXAM: CT ANGIOGRAPHY HEAD AND NECK TECHNIQUE: Multidetector CT imaging of the head and neck was performed using the standard protocol during bolus administration of intravenous contrast. Multiplanar CT image reconstructions and MIPs were obtained to evaluate the vascular anatomy. Carotid stenosis measurements (when applicable) are obtained utilizing NASCET criteria, using the distal internal carotid diameter as the denominator. CONTRAST:  50 mL Isovue 370 COMPARISON:  Head CT without contrast 0057 hours today, and earlier. Brain MRI 04/01/2014. Chest CTA 06/27/2016. FINDINGS: CTA NECK Skeleton: No acute osseous abnormality identified. Degenerative osteophytes and occasional interbody ankylosis in the spine. Upper chest: Mediastinal lipomatosis. Enlarged central pulmonary artery (estimated at 43 mm diameter versus adjacent ascending aorta of 41 mm). Atelectatic changes to the trachea. Negative visible lung parenchyma. No superior mediastinal lymphadenopathy. Other neck: Negative.  No cervical lymphadenopathy. Aortic arch: Suboptimal contrast bolus, with significant reflux of venous contrast into both internal jugular veins, and left chest wall venous collaterals. There is no significant central venous stenosis visible on these images. Three vessel arch configuration with minimal arch atherosclerosis. Right carotid system: Mild soft plaque in the right CCA without stenosis. Soft and calcified plaque at the right carotid bifurcation and posterior right ICA origin and bulb with  less than 50 % stenosis with respect to the distal vessel. Left carotid system: The left CCA at the level of the hypopharynx is obscured by dense streak artifact from contrast reflux into the left IJ. The visible left CCA is patent without stenosis. Left carotid bifurcation is patent without stenosis. Partially retropharyngeal course of the left ICA in the neck. Vertebral arteries: No proximal right subclavian artery stenosis. Right vertebral artery origin appears normal. Suboptimal proximal V 2 contrast, but no right vertebral artery stenosis identified in the neck. No proximal left subclavian artery stenosis. Suboptimal proximal left vertebral artery contrast, but the left vertebral artery origin and proximal vessel appear grossly normal. The distal left vertebral artery is patent without stenosis. CTA HEAD Posterior circulation: Patent distal vertebral  arteries and vertebrobasilar junction with atherosclerosis but no definite hemodynamically significant stenosis. Patent basilar artery without stenosis. SCA and PCA origins are patent. Posterior communicating arteries are diminutive or absent. Bilateral PCA branches are regular but patent. Anterior circulation: Both ICA siphons are patent. Moderate left and mild right ICA siphon calcified plaque. No significant siphon stenosis. Patent carotid termini. Patent MCA and ACA origins. Both MCA M1 segments are patent but irregular compatible with atherosclerosis. Allowing for the suboptimal contrast bolus timing, no left MCA branch occlusion is identified. Dominant right ACA A1 segment. Anterior communicating artery and bilateral ACA branches are patent with mild to moderate A2 segment irregularity. Venous sinuses: Early contrast timing, not evaluated. Anatomic variants: Dominant right A1 segment. Review of the MIP images confirms the above findings IMPRESSION: 1. Suboptimal arterial contrast timing. Negative for large vessel occlusion. This was preliminarily discussed by  telephone with Dr. Kerney Elbe on 06/17/2017 at 0118 hours. 2. Widespread intracranial atherosclerosis, including involvement of the bilateral M1 segments. No MCA branch occlusion identified. 3. No hemodynamically significant stenosis identified in the neck. 4. Chronically enlarged main pulmonary artery suggesting pulmonary artery hypertension. Electronically Signed   By: Genevie Ann M.D.   On: 06/17/2017 01:33   Dg Chest 2 View  Result Date: 06/29/2017 CLINICAL DATA:  Left chest pain radiating to the left arm EXAM: CHEST  2 VIEW COMPARISON:  May 05, 2017 FINDINGS: The mediastinal contour is normal. The heart size is enlarged. There is no focal infiltrate, pulmonary edema, or pleural effusion. Minimal atelectasis of right lung base is identified. Degenerative joint changes of the spine are noted. IMPRESSION: No focal pneumonia or pulmonary edema. Cardiomegaly. Electronically Signed   By: Abelardo Diesel M.D.   On: 06/29/2017 21:23   Ct Angio Neck W Or Wo Contrast  Result Date: 06/17/2017 CLINICAL DATA:  56 year old male code stroke presentation with left side weakness. EXAM: CT ANGIOGRAPHY HEAD AND NECK TECHNIQUE: Multidetector CT imaging of the head and neck was performed using the standard protocol during bolus administration of intravenous contrast. Multiplanar CT image reconstructions and MIPs were obtained to evaluate the vascular anatomy. Carotid stenosis measurements (when applicable) are obtained utilizing NASCET criteria, using the distal internal carotid diameter as the denominator. CONTRAST:  50 mL Isovue 370 COMPARISON:  Head CT without contrast 0057 hours today, and earlier. Brain MRI 04/01/2014. Chest CTA 06/27/2016. FINDINGS: CTA NECK Skeleton: No acute osseous abnormality identified. Degenerative osteophytes and occasional interbody ankylosis in the spine. Upper chest: Mediastinal lipomatosis. Enlarged central pulmonary artery (estimated at 43 mm diameter versus adjacent ascending aorta of 41 mm).  Atelectatic changes to the trachea. Negative visible lung parenchyma. No superior mediastinal lymphadenopathy. Other neck: Negative.  No cervical lymphadenopathy. Aortic arch: Suboptimal contrast bolus, with significant reflux of venous contrast into both internal jugular veins, and left chest wall venous collaterals. There is no significant central venous stenosis visible on these images. Three vessel arch configuration with minimal arch atherosclerosis. Right carotid system: Mild soft plaque in the right CCA without stenosis. Soft and calcified plaque at the right carotid bifurcation and posterior right ICA origin and bulb with less than 50 % stenosis with respect to the distal vessel. Left carotid system: The left CCA at the level of the hypopharynx is obscured by dense streak artifact from contrast reflux into the left IJ. The visible left CCA is patent without stenosis. Left carotid bifurcation is patent without stenosis. Partially retropharyngeal course of the left ICA in the neck. Vertebral arteries: No proximal  right subclavian artery stenosis. Right vertebral artery origin appears normal. Suboptimal proximal V 2 contrast, but no right vertebral artery stenosis identified in the neck. No proximal left subclavian artery stenosis. Suboptimal proximal left vertebral artery contrast, but the left vertebral artery origin and proximal vessel appear grossly normal. The distal left vertebral artery is patent without stenosis. CTA HEAD Posterior circulation: Patent distal vertebral arteries and vertebrobasilar junction with atherosclerosis but no definite hemodynamically significant stenosis. Patent basilar artery without stenosis. SCA and PCA origins are patent. Posterior communicating arteries are diminutive or absent. Bilateral PCA branches are regular but patent. Anterior circulation: Both ICA siphons are patent. Moderate left and mild right ICA siphon calcified plaque. No significant siphon stenosis. Patent  carotid termini. Patent MCA and ACA origins. Both MCA M1 segments are patent but irregular compatible with atherosclerosis. Allowing for the suboptimal contrast bolus timing, no left MCA branch occlusion is identified. Dominant right ACA A1 segment. Anterior communicating artery and bilateral ACA branches are patent with mild to moderate A2 segment irregularity. Venous sinuses: Early contrast timing, not evaluated. Anatomic variants: Dominant right A1 segment. Review of the MIP images confirms the above findings IMPRESSION: 1. Suboptimal arterial contrast timing. Negative for large vessel occlusion. This was preliminarily discussed by telephone with Dr. Kerney Elbe on 06/17/2017 at 0118 hours. 2. Widespread intracranial atherosclerosis, including involvement of the bilateral M1 segments. No MCA branch occlusion identified. 3. No hemodynamically significant stenosis identified in the neck. 4. Chronically enlarged main pulmonary artery suggesting pulmonary artery hypertension. Electronically Signed   By: Genevie Ann M.D.   On: 06/17/2017 01:33   Ct Angio Chest Pe W Or Wo Contrast  Result Date: 06/30/2017 CLINICAL DATA:  56 y/o  M; chest wall pain. EXAM: CT ANGIOGRAPHY CHEST WITH CONTRAST TECHNIQUE: Multidetector CT imaging of the chest was performed using the standard protocol during bolus administration of intravenous contrast. Multiplanar CT image reconstructions and MIPs were obtained to evaluate the vascular anatomy. CONTRAST:  100 cc Isovue 370 COMPARISON:  06/27/2016 CTA of the chest. FINDINGS: Cardiovascular: Stable ascending thoracic aorta measuring 4.3 cm. Stable enlarged main pulmonary artery measures 4.7 cm. Stable mild cardiomegaly. Coronary stent in the LAD. Satisfactory opacification of the main pulmonary artery. No evidence of acute pulmonary embolus. Mediastinum/Nodes: No enlarged mediastinal, hilar, or axillary lymph nodes. Thyroid gland, trachea, and esophagus demonstrate no significant findings.  Lungs/Pleura: Lungs are clear. No pleural effusion or pneumothorax. Upper Abdomen: No acute abnormality. Musculoskeletal: No chest wall abnormality. No acute or significant osseous findings. Review of the MIP images confirms the above findings. IMPRESSION: 1. No pulmonary embolus identified. 2. Stable ectatic ascending thoracic aorta measuring 4.3 cm. Recommend annual imaging followup by CTA or MRA. This recommendation follows 2010 ACCF/AHA/AATS/ACR/ASA/SCA/SCAI/SIR/STS/SVM Guidelines for the Diagnosis and Management of Patients with Thoracic Aortic Disease. Circulation. 2010; 121: Y694-W546. 3. Enlarged main pulmonary artery compatible with pulmonary artery hypertension. 4. No acute pulmonary process. Electronically Signed   By: Kristine Garbe M.D.   On: 06/30/2017 06:47   Mr Brain Wo Contrast  Result Date: 06/17/2017 CLINICAL DATA:  Patient presents to the ED with history of LEFT arm and shoulder numbness beginning earlier today. LEFT-sided weakness? Headache with dizziness. Blurred vision. Aphasia. EXAM: MRI HEAD WITHOUT CONTRAST TECHNIQUE: Multiplanar, multiecho pulse sequences of the brain and surrounding structures were obtained without intravenous contrast. COMPARISON:  None. FINDINGS: The patient could not tolerate the exam due to shortness of breath and it was prematurely truncated. Brain: No evidence for acute infarction, hemorrhage, mass  lesion, hydrocephalus, or extra-axial fluid. Normal for age cerebral volume. No definite white matter disease. Vascular: Flow voids are maintained. Skull and upper cervical spine: Normal marrow signal. Sinuses/Orbits: Negative. Other: None. IMPRESSION: Prematurely truncated exam demonstrating no definite acute or focal intracranial findings. Electronically Signed   By: Staci Righter M.D.   On: 06/17/2017 07:09   Mr Cervical Spine Wo Contrast  Result Date: 06/20/2017 CLINICAL DATA:  Disc disorder at C5-6 with radiculopathy. Pain radiates down the left arm  for 1.5 years. EXAM: MRI CERVICAL SPINE WITHOUT CONTRAST TECHNIQUE: Multiplanar, multisequence MR imaging of the cervical spine was performed. No intravenous contrast was administered. COMPARISON:  Cervical spine CT 06/04/2016 FINDINGS: Alignment: Straightening without subluxation. Vertebrae: No fracture, evidence of discitis, or bone lesion. Cord: Normal signal and morphology, relying mainly on sagittal acquisition due to motion degraded axials. Posterior Fossa, vertebral arteries, paraspinal tissues: Negative Disc levels: C2-3: No herniation or impingement C3-4: Minor facet spurring.  No herniation or impingement C4-5: Shallow central disc protrusion contacting the ventral cord with mild deformity. By CT, there is mild ossification of the posterior longitudinal ligament. Patent foramina C5-6: Degenerative disc narrowing with broad protrusion that migrates superiorly and inferiorly, accentuated by posterior longitudinal ligament ossification by CT. There is asymmetric right-sided uncovertebral spurring with right foraminal impingement. Left foramen is patent. Mild ligamentum flavum buckling. Spinal stenosis causes mild cord flattening. C6-7: Subligamentous disc protrusion with posterior longitudinal ligament ossification causing borderline stenosis without cord deformity. Negative facets. Patent foramina. C7-T1:Small right paracentral disc protrusion without visible neural contact. Negative facets. Motion degraded study, especially affecting axial acquisition. IMPRESSION: 1. 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 Stenosis is greatest at C5-6 where there is mild cord flattening. 2. C5-6 right foraminal impingement from asymmetric uncovertebral spurring. No definite foraminal impingement on the left. 3. Motion degraded axial acquisitions due to patient pain. Electronically Signed   By: Monte Fantasia M.D.   On: 06/20/2017 08:50    Ct Head Code Stroke Wo Contrast  Result Date: 06/17/2017 CLINICAL DATA:  Code stroke. 56 year old male with left side weakness. Last seen normal 0015 hours. EXAM: CT HEAD WITHOUT CONTRAST TECHNIQUE: Contiguous axial images were obtained from the base of the skull through the vertex without intravenous contrast. COMPARISON:  05/05/2017 and earlier. FINDINGS: Brain: Cerebral volume remains normal. No midline shift, ventriculomegaly, mass effect, evidence of mass lesion, intracranial hemorrhage or evidence of cortically based acute infarction. Gray-white matter differentiation is within normal limits throughout the brain. Vascular: Calcified atherosclerosis at the skull base. No suspicious intracranial vascular hyperdensity. Skull: Stable.  No acute osseous abnormality identified. Sinuses/Orbits: Visualized paranasal sinuses and mastoids are stable and well pneumatized. Other: No acute orbit or scalp soft tissue finding. ASPECTS ([place] Stroke Program Early CT Score) - Ganglionic level infarction (caudate, lentiform nuclei, internal capsule, insula, M1-M3 cortex): 7 - Supraganglionic infarction (M4-M6 cortex): 3 Total score (0-10 with 10 being normal): 10 IMPRESSION: 1. Stable and normal noncontrast CT appearance of the brain. 2. ASPECTS is 10. 3. The above was relayed via text pager to Dr. Bruce Donath On 06/17/2017 at 01:07 . Electronically Signed   By: Genevie Ann M.D.   On: 06/17/2017 01:07   Labs: Basic Metabolic Panel:  Recent Labs Lab 06/29/17 2044  NA 133*  K 4.3  CL 96*  CO2 23  GLUCOSE 217*  BUN 8  CREATININE 0.79  CALCIUM 8.5*   Liver Function Tests:  Recent  Labs Lab 06/29/17 2107  AST 31  ALT 22  ALKPHOS 37*  BILITOT 0.7  PROT 6.4*  ALBUMIN 3.7    Recent Labs Lab 06/29/17 2107  LIPASE 32   CBC:  Recent Labs Lab 06/29/17 2254  WBC 8.8  HGB 11.3*  HCT 35.5*  MCV 88.5  PLT 235   Cardiac Enzymes:  Recent Labs Lab 06/30/17 0320 06/30/17 0851  TROPONINI <0.03  <0.03   BNP: BNP (last 3 results)  Recent Labs  04/20/17 1926 05/05/17 2051 06/29/17 2218  BNP 14.4 11.5 19.4   CBG:  Recent Labs Lab 06/30/17 0810 06/30/17 1104  GLUCAP 176* 172*    Signed:  Barton Dubois MD.  [facility] 06/30/2017, 12:08 PM

## 2017-06-30 NOTE — Consult Note (Signed)
Cardiology Consultation:   Patient ID: [id number] FECTEAU; [id number]; 1961-10-18   Admit date: 06/29/2017 Date of Consult: 06/30/2017  Primary Care Provider: Rogers Blocker, MD Primary Cardiologist: Dr. Stanford Breed Primary Electrophysiologist:     Patient Profile:   DIO GILLER is a 56 y.o. male with a hx of CAD (s/p DES to LAD 05/2016), HTN, HLD, DM2, OSA on CPA, hx of DVT and PE, and hx of stroke (on ASA and plavix) who is being seen today for the evaluation of chest pain at the request of Dr. Dyann Kief.  History of Present Illness:   Mr. Bogue is known to this service and last saw [facility] in clnic on 11/22/16. At that time, he was being evaluated for chest pain. He visited the [facility] 11/21/16 with chest pain. While there had negative troponin x 3, EKG without signs of ischemia, and CXR without active cardiopulmonary disease. He was discharged and saw Strader in the office the next day. It was thought that this pain was related to MSK and no further workup was completed at that time. Imdur was increased to 30 mg BID from daily as he was having chest discomfort during the and was previously taking imdur once in the evening.  Since that clinic visit, he has had several admissions. On 06/17/17-06/18/17 he was evaluated by neurology for left arm numbness thought to be related to new stroke/TIA. Head CT and MRI brain without acute process. His left arm numbness and tingling was thought to be related to cervical radiculopathy - he had a history of spinal stenosis.  On 06/09/17 he was seen for hematemesis.  EGD from 2017 was reviewed and showed no ulcer or signs of bleeding. He was monitored in the ED for several hours without recurrence of hematemesis or bloody stool and was discharged.  On 05/05/17 he was seen in the ED for blurry vision and headache. Headache was resolved with administration of tylenol in ED and he was discharged.  On 04/20/17 he was seen for leg swelling. He was discharged  and duplex on 04/21/17 was negative for DVT. On 04/22/17 he was seen in the ED again for leg swelling. He had not been noncompliant with compression socks. Lasix was increased and he was discharged.   On 03/27/17 he was seen for leg and hip pain, thought to be related to his chronic back issues and he was discharged.  He was seen on 01/22/17, 01/15/17, 01/09/17 in the ED for similar complaints.   Yesterday, he presented to [facility] with recurrence of chest pain. He reported that chest pain started last evening around 6pm, located substernally and to the left, started in his head and radiated to his left arm. He also reported left calf tenderness and swelling that is at his baseline. He denies recent travel. The chest pain was rated as a 9/10, described as a pressure and as a stabbing pain, made worse by raising his left arm in the air, with no relieving factors. He was sitting outside when the chest pain started and reported diaphoresis, although it was very hot and humid outside yesterday. The pain never subsided. He did not take nitro and reports that nitro by EMS did not relieve his pain.   D-dimer was positive at 0.73. CTA negative for PE. Lower extremity duplex pending. BNP WNL   Past Medical History:  Diagnosis Date  . Arthritis   . Back pain   . CAD (coronary artery disease)    a. s/p DES  to LAD in 05/2016  . DVT (deep venous thrombosis) ([place])   . Hyperlipidemia   . Hypertension   . IBS (irritable bowel syndrome)   . OSA (obstructive sleep apnea)   . Pancreatitis   . PE (pulmonary thromboembolism) ([place])   . PUD (peptic ulcer disease)   . Renal disorder   . Stroke ([place])   . Type 2 diabetes mellitus ([place])     Past Surgical History:  Procedure Laterality Date  . CARDIAC CATHETERIZATION N/A 05/31/2016   Procedure: Left Heart Cath and Coronary Angiography;  Surgeon: Peter M Martinique, MD;  Location: [place] CV [facility];  Service: Cardiovascular;  Laterality: N/A;  . CARDIAC CATHETERIZATION N/A  05/31/2016   Procedure: Intravascular Pressure Wire/FFR Study;  Surgeon: Peter M Martinique, MD;  Location: [place] [facility];  Service: Cardiovascular;  Laterality: N/A;  . CARDIAC CATHETERIZATION N/A 05/31/2016   Procedure: Coronary Stent Intervention;  Surgeon: Peter M Martinique, MD;  Location: [place] [facility];  Service: Cardiovascular;  Laterality: N/A;  . LEFT HEART CATHETERIZATION WITH CORONARY ANGIOGRAM N/A 02/03/2014   Procedure: LEFT HEART CATHETERIZATION WITH CORONARY ANGIOGRAM;  Surgeon: Pixie Casino, MD;  Location: [facility] CATH LAB;  Service: Cardiovascular;  Laterality: N/A;  . left leg stent        Home Medications:  Prior to Admission medications   Medication Sig Start Date End Date Taking? Authorizing Provider  aspirin 81 MG chewable tablet Chew 1 tablet (81 mg total) by mouth daily. 06/01/16  Yes Elwin Mocha, MD  atorvastatin (LIPITOR) 80 MG tablet Take 1 tablet (80 mg total) by mouth daily. 01/26/17  Yes Strader, Fransisco Hertz, PA-C  carvedilol (COREG) 25 MG tablet Take 1 tablet (25 mg total) by mouth 2 (two) times daily with a meal. 01/26/17  Yes Strader, [place] M, PA-C  clopidogrel (PLAVIX) 75 MG tablet Take 75 mg by mouth daily.   Yes [provider]  famotidine (PEPCID) 40 MG tablet Take 1 tablet (40 mg total) by mouth at bedtime. Patient taking differently: Take 40 mg by mouth daily.  06/18/17  Yes Barton Dubois, MD  furosemide (LASIX) 40 MG tablet Take 1 tablet (40 mg total) by mouth 2 (two) times daily. 01/26/17  Yes Strader, [place] M, PA-C  glipiZIDE (GLUCOTROL) 10 MG tablet Take 10 mg by mouth 2 (two) times daily. 05/30/17  Yes [provider]  hydrALAZINE (APRESOLINE) 50 MG tablet Take 50 mg by mouth 2 (two) times daily.   Yes [provider]  Insulin Detemir (LEVEMIR) 100 UNIT/ML Pen Inject 20 Units into the skin daily at 10 pm. 06/18/17  Yes Barton Dubois, MD  isosorbide dinitrate (ISORDIL) 30 MG tablet Take 1 tablet (30 mg total) by mouth 2 (two)  times daily. Patient taking differently: Take 30 mg by mouth every morning.  11/22/16  Yes Strader, [place] M, PA-C  LYRICA 75 MG capsule Take 75 mg by mouth 2 (two) times daily. 04/03/17  Yes [provider]  metFORMIN (GLUCOPHAGE-XR) 500 MG 24 hr tablet Take 1,000 mg by mouth 2 (two) times daily. 04/14/17  Yes [provider]  nitroGLYCERIN (NITROSTAT) 0.4 MG SL tablet Place 0.4 mg under the tongue every 5 (five) minutes as needed for chest pain. 12/17/16  Yes [provider]  ondansetron (ZOFRAN ODT) 4 MG disintegrating tablet Take 1 tablet (4 mg total) by mouth every 8 (eight) hours as needed for nausea or vomiting. 06/09/17  Yes Varney Biles, MD  Oxycodone HCl 10 MG TABS  Take 10 mg by mouth 2 (two) times daily as needed (for pain).  04/18/17  Yes [provider]  pantoprazole (PROTONIX) 40 MG tablet Take 1 tablet (40 mg total) by mouth 2 (two) times daily. 06/18/17  Yes Barton Dubois, MD  potassium chloride SA (K-DUR,KLOR-CON) 20 MEQ tablet Take 20 mEq by mouth daily.   Yes [provider]  sertraline (ZOLOFT) 50 MG tablet Take 50 mg by mouth daily.   Yes [provider]  sitaGLIPtin (JANUVIA) 100 MG tablet Take 100 mg by mouth daily.   Yes [provider]  tiZANidine (ZANAFLEX) 2 MG tablet Take 2 mg by mouth every 6 (six) hours as needed for muscle spasms.   Yes [provider]  traZODone (DESYREL) 50 MG tablet Take 1 tablet (50 mg total) by mouth at bedtime. 12/15/16  Yes Burns, Arloa Koh, MD  Insulin Pen Needle 31G X 5 MM MISC Use 1 needle daily to inject insulin as prescribed 06/18/17   Barton Dubois, MD  Omega-3 Fatty Acids (FISH OIL CONCENTRATE) 1000 MG CAPS Take 2 capsules (2,000 mg total) by mouth daily. Patient not taking: Reported on 06/29/2017 06/18/17   Barton Dubois, MD    Inpatient Medications: Scheduled Meds: . aspirin  81 mg Oral Daily  . atorvastatin  80 mg Oral Daily  . carvedilol  25 mg Oral BID WC  .  clopidogrel  75 mg Oral Daily  . famotidine  40 mg Oral QHS  . furosemide  40 mg Oral BID  . hydrALAZINE  50 mg Oral BID  . insulin aspart  0-9 Units Subcutaneous TID WC  . insulin detemir  20 Units Subcutaneous Q2200  . isosorbide dinitrate  30 mg Oral BID  . pantoprazole  40 mg Oral BID  . pregabalin  75 mg Oral BID  . sertraline  50 mg Oral Daily  . traZODone  50 mg Oral QHS   Continuous Infusions: . sodium chloride 75 mL/hr at 06/30/17 0228  . heparin 1,350 Units/hr (06/30/17 0159)   PRN Meds: acetaminophen, hydrALAZINE, HYDROmorphone (DILAUDID) injection, nitroGLYCERIN, ondansetron (ZOFRAN) IV, oxyCODONE, tiZANidine, zolpidem  Allergies:    Allergies  Allergen Reactions  . Coconut Flavor [Flavoring Agent] Hives  . Coconut Oil Hives  . Ibuprofen Other (See Comments)    Made gastric ulcers worse    Social History:   Social History   Social History  . Marital status: Single    Spouse name: N/A  . Number of children: 3  . Years of education: 12   Occupational History  . Scientist, product/process development   Social History Main Topics  . Smoking status: Never Smoker  . Smokeless tobacco: Never Used  . Alcohol use No  . Drug use: No  . Sexual activity: Not on file   Other Topics Concern  . Not on file   Social History Narrative   Lives in an ALF in [place]. Independent and ambulatory with cane.     Family History:    Family History  Problem Relation Age of Onset  . Cancer Father   . Hypertension Mother   . Diabetes Mother   . Breast cancer Mother   . Hypertension Brother   . Diabetes Brother   . Hypertension Sister   . Diabetes Sister      ROS:  Please see the history of present illness.  ROS  All other ROS reviewed and negative.     Physical Exam/Data:   Vitals:   06/29/17 2330 06/30/17  0000 06/30/17 0030 06/30/17 0225  BP: 113/86 136/82 129/64 (!) 132/59  Pulse: 68 71 70 69  Resp: 11 17 (!) 22 (!) 22  Temp:    98.2 F (36.8 C)    TempSrc:    Oral  SpO2: 98% 94% 95% 97%  Weight:    (!) 314 lb 12.8 oz (142.8 kg)  Height:        Intake/Output Summary (Last 24 hours) at 06/30/17 0735 Last data filed at 06/30/17 0400  Gross per 24 hour  Intake           142.23 ml  Output                0 ml  Net           142.23 ml   Filed Weights   06/29/17 2042 06/30/17 0225  Weight: (!) 307 lb (139.3 kg) (!) 314 lb 12.8 oz (142.8 kg)   Body mass index is 45.17 kg/m.  General:  Morbidly obese male in no acute distress HEENT: normal Lymph: no adenopathy Neck: no JVD Endocrine:  No thryomegaly Vascular: No carotid bruits Cardiac:  normal S1, S2; RRR; no murmur Lungs:  clear to auscultation bilaterally, no wheezing, rhonchi or rales  Abd: soft, nontender, no hepatomegaly  Ext: 1+ to 2+ B LE edema with chronic skin changes Musculoskeletal:  No deformities, BUE and BLE strength normal and equal Skin: warm and dry  Psych:  Normal affect   EKG:  The EKG was personally reviewed and demonstrates:  NSR Telemetry:  Telemetry was personally reviewed and demonstrates:  NSR  Relevant CV Studies:  Cardiac Catheterization: 05/2016  Mid RCA lesion, 25 %stenosed.  A STENT SYNERGY DES 4X16 drug eluting stent was successfully placed.  Mid LAD lesion, 70 %stenosed.  Post intervention, there is a 0% residual stenosis.  1. Single vessel obstructive CAD. 70% mid LAD with abnormal FFR of 0.67.  2. Successful stenting of the mid LAD with DES 3. Elevated LVEDP  Plan: DAPT for one year. Anticipate DC in am.    Echocardiogram: 05/2016 Study Conclusions - Left ventricle: The cavity size was normal. There was mild concentric hypertrophy. Systolic function was normal. The estimated ejection fraction was in the range of 55% to 60%. Wall motion was normal; there were no regional wall motion abnormalities. Left ventricular diastolic function parameters were normal. - Aortic valve: Transvalvular velocity was within the  normal range. There was no stenosis. There was no regurgitation. Valve area (VTI): 2.67 cm^2. Valve area (Vmax): 2.23 cm^2. Valve area (Vmean): 2.09 cm^2. - Mitral valve: Transvalvular velocity was within the normal range. There was no evidence for stenosis. There was trivial regurgitation. - Right ventricle: The cavity size was normal. Wall thickness was normal. Systolic function was normal. - Atrial septum: No defect or patent foramen ovale was identified by color flow Doppler. - Tricuspid valve: There was no regurgitation.   Laboratory Data:  Chemistry Recent Labs Lab 06/29/17 2044  NA 133*  K 4.3  CL 96*  CO2 23  GLUCOSE 217*  BUN 8  CREATININE 0.79  CALCIUM 8.5*  GFRNONAA >60  GFRAA >60  ANIONGAP 14     Recent Labs Lab 06/29/17 2107  PROT 6.4*  ALBUMIN 3.7  AST 31  ALT 22  ALKPHOS 37*  BILITOT 0.7   Hematology Recent Labs Lab 06/29/17 2254  WBC 8.8  RBC 4.01*  HGB 11.3*  HCT 35.5*  MCV 88.5  MCH 28.2  MCHC 31.8  RDW  14.4  PLT 235   Cardiac Enzymes Recent Labs Lab 06/30/17 0320  TROPONINI <0.03    Recent Labs Lab 06/29/17 2109  TROPIPOC 0.00    BNP Recent Labs Lab 06/29/17 2218  BNP 19.4    DDimer  Recent Labs Lab 06/30/17 0226  DDIMER 0.73*    Radiology/Studies:  Dg Chest 2 View  Result Date: 06/29/2017 CLINICAL DATA:  Left chest pain radiating to the left arm EXAM: CHEST  2 VIEW COMPARISON:  May 05, 2017 FINDINGS: The mediastinal contour is normal. The heart size is enlarged. There is no focal infiltrate, pulmonary edema, or pleural effusion. Minimal atelectasis of right lung base is identified. Degenerative joint changes of the spine are noted. IMPRESSION: No focal pneumonia or pulmonary edema. Cardiomegaly. Electronically Signed   By: Abelardo Diesel M.D.   On: 06/29/2017 21:23   Ct Angio Chest Pe W Or Wo Contrast  Result Date: 06/30/2017 CLINICAL DATA:  56 y/o  M; chest wall pain. EXAM: CT ANGIOGRAPHY CHEST  WITH CONTRAST TECHNIQUE: Multidetector CT imaging of the chest was performed using the standard protocol during bolus administration of intravenous contrast. Multiplanar CT image reconstructions and MIPs were obtained to evaluate the vascular anatomy. CONTRAST:  100 cc Isovue 370 COMPARISON:  06/27/2016 CTA of the chest. FINDINGS: Cardiovascular: Stable ascending thoracic aorta measuring 4.3 cm. Stable enlarged main pulmonary artery measures 4.7 cm. Stable mild cardiomegaly. Coronary stent in the LAD. Satisfactory opacification of the main pulmonary artery. No evidence of acute pulmonary embolus. Mediastinum/Nodes: No enlarged mediastinal, hilar, or axillary lymph nodes. Thyroid gland, trachea, and esophagus demonstrate no significant findings. Lungs/Pleura: Lungs are clear. No pleural effusion or pneumothorax. Upper Abdomen: No acute abnormality. Musculoskeletal: No chest wall abnormality. No acute or significant osseous findings. Review of the MIP images confirms the above findings. IMPRESSION: 1. No pulmonary embolus identified. 2. Stable ectatic ascending thoracic aorta measuring 4.3 cm. Recommend annual imaging followup by CTA or MRA. This recommendation follows 2010 ACCF/AHA/AATS/ACR/ASA/SCA/SCAI/SIR/STS/SVM Guidelines for the Diagnosis and Management of Patients with Thoracic Aortic Disease. Circulation. 2010; 121: Q683-M196. 3. Enlarged main pulmonary artery compatible with pulmonary artery hypertension. 4. No acute pulmonary process. Electronically Signed   By: Kristine Garbe M.D.   On: 06/30/2017 06:47    Assessment and Plan:   1. Chest pain - troponin x 2 negative - EKG without signs of ischemia - recent heart cath (2017) with mild (25%) stenosis in the RCA following DES to LAD  - This chest pain has typical and atypical features. He has known disease, however was recently cathed in 2017 with only 25% stenosis in the RCA. Chest pain sounds musculoskeletal in nature with arm movement  worsening his pain, not relieved by nitro, and described by patient as related to his back, leg, and hip pain. Because of his weight, he is not a good coronary CT candidate. Will discuss with attending need for ischemic evaluation. Given his multiple recent ED visits this year, may elect to proceed with repeat heart cath to definitively rule out ACS. Alternatively, a repeat echo to evaluate WMA may help guide clinical decision. BNP WNL.   2. Elevated D-dimer - CTA negative for PE - lower extremity duplex pending   3. Hx of stroke, TIA - on ASA, palvix, and statin   4. HTN - he now takes 30 mg imdur every morning  - continue home coreg, lasix, and hydralazine    5. HLD - continue statin  Signed, Ledora Bottcher, [place]  06/30/2017 7:35  AM  Personally seen and examined. Agree with above.  56 year old male with LAD stent placed approximately one year ago here with atypical chest pain.  Exam: Alert, regular rate and rhythm, obese male with ability to duplicate chest discomfort described a sharpness when pushing on his left pectoralis muscle.  Atypical chest pain  - Musculoskeletal in etiology. I was able to duplicate with palpation.  - He also feels some of the sharp quality chest discomfort when using his left arm.  - No further cardiac ischemic testing needed.  - Troponin reassuring, EKG reassuring.  Morbid obesity  - Continue to encourage weight loss we discussed.  Hypertension  - Continue her medications as reviewed above.  No further cardiac ischemic testing. I'm comfortable with him being discharged from a cardiac perspective.  Candee Furbish, MD

## 2017-07-10 ENCOUNTER — Ambulatory Visit: Payer: Self-pay | Admitting: Physician Assistant

## 2017-07-11 ENCOUNTER — Encounter: Payer: Self-pay | Admitting: Physician Assistant

## 2017-07-11 ENCOUNTER — Ambulatory Visit (INDEPENDENT_AMBULATORY_CARE_PROVIDER_SITE_OTHER): Payer: Medicaid Other | Admitting: Physician Assistant

## 2017-07-11 VITALS — BP 150/74 | HR 84 | Ht 70.0 in | Wt 325.0 lb

## 2017-07-11 DIAGNOSIS — I10 Essential (primary) hypertension: Secondary | ICD-10-CM

## 2017-07-11 DIAGNOSIS — I5032 Chronic diastolic (congestive) heart failure: Secondary | ICD-10-CM

## 2017-07-11 DIAGNOSIS — R0789 Other chest pain: Secondary | ICD-10-CM | POA: Diagnosis not present

## 2017-07-11 DIAGNOSIS — I2510 Atherosclerotic heart disease of native coronary artery without angina pectoris: Secondary | ICD-10-CM | POA: Diagnosis not present

## 2017-07-11 MED ORDER — HYDRALAZINE HCL 50 MG PO TABS: 50.0000 mg | ORAL_TABLET | Freq: Three times a day (TID) | ORAL | 1 refills | Status: DC

## 2017-07-11 MED ORDER — HYDRALAZINE HCL 50 MG PO TABS: 50.0000 mg | ORAL_TABLET | Freq: Three times a day (TID) | ORAL | 6 refills | Status: DC

## 2017-07-11 NOTE — Patient Instructions (Addendum)
Medication Instructions:  INCREASE Hydralazine to 3 times per day.  Labwork: None   Testing/Procedures: None   Follow-Up: Your physician wants you to follow-up in: 6 months with Dr Stanford Breed. You will receive a reminder letter in the mail two months in advance. If you don't receive a letter, please call our office to schedule the follow-up appointment.  Any Other Special Instructions Will Be Listed Below (If Applicable).  Daily weights are important, start doing them as soon as possible. If the organization does not give you a scale, contact us, we may be able to provide it.   Minimize sodium in your diet, 2000 mg per day. If you need a refill on your cardiac medications before your next appointment, please call your pharmacy.

## 2017-07-11 NOTE — Progress Notes (Signed)
Cardiology Office Note   Date:  07/11/2017   ID:  [name], DOB [birth year], MRN [id number]  PCP:  Rogers Blocker, MD  Cardiologist:  Dr. Stanford Breed 08/2016 Darden Flemister, Suanne Marker, PA-C    History of Present Illness: [name] is a 56 y.o. male with a history of DES LAD 05/2016, HTN, HLD, DM, stroke (aspirin and Plavix), depression, PUD, DVT 2014 PE, OSA on CPAP, chronic back pain, chronic abdominal pain and diarrhea due to IBS, remote hx crack use, ARI 2017>> diuretics and potassium decreased  Admitted 09/6-09/04/2017 for chest pain, felt musculoskeletal or secondary to radiculopathy, CTA negative for PE, EF normal on echo, chest wall tenderness noted, no further cardiac testing needed (total of 15 ER visits in 2018 for various things)  [name] presents for cardiology follow up.  He is still getting chest pain. It comes and goes. It is pressure. It is not exertional. It is sharp, it resolves spontaneously. He gets it multiple times a week, but may skip a day at times.   Sometimes he will start breathing heavy, with minor exertion such as getting up out of a chair. He is not checking his weight at home. His weight was down 2 lbs from yesterday. He has been contacted about getting assistance with the CHF, they will be giving him scales. He does not currently have scales. He will do daily weights when he gets the scales. He does not always wake up with LE edema. He does not normally sleep in the bed because of pain in his back and hip. If he is able to sleep in the bed, his legs go down. Compression socks help. He is not doing that well with a low Na diet. He is baking more and trying to eat better.   He has gained weight on insulin and is frustrated with this.   He had never heard the term radiculopathy. However, he was aware that he has spinal stenosis and multiple problems in his neck and back.  He has several physicians in [place], but has moved to [place]. He wants to switch to  physicians that are in [place].  He has had his am medications today, but did not take them yesterday, was out of the house at appts all day.    Past Medical History:  Diagnosis Date  . Arthritis   . Back pain   . CAD (coronary artery disease)    a. s/p DES to LAD in 05/2016  . DVT (deep venous thrombosis) ([place])   . Hyperlipidemia   . Hypertension   . IBS (irritable bowel syndrome)   . OSA (obstructive sleep apnea)   . Pancreatitis   . PE (pulmonary thromboembolism) ([place])   . PUD (peptic ulcer disease)   . Renal disorder   . Stroke ([place])   . Type 2 diabetes mellitus ([place])     Past Surgical History:  Procedure Laterality Date  . CARDIAC CATHETERIZATION N/A 05/31/2016   Procedure: Left Heart Cath and Coronary Angiography;  Surgeon: Peter M Martinique, MD;  Location: [facility];  Service: Cardiovascular;  Laterality: N/A;  . CARDIAC CATHETERIZATION N/A 05/31/2016   Procedure: Intravascular Pressure Wire/FFR Study;  Surgeon: Peter M Martinique, MD;  Location: [facility];  Service: Cardiovascular;  Laterality: N/A;  . CARDIAC CATHETERIZATION N/A 05/31/2016   Procedure: Coronary Stent Intervention;  Surgeon: Peter M Martinique, MD;  Location: [facility];  Service: Cardiovascular;  Laterality: N/A;  .  LEFT HEART CATHETERIZATION WITH CORONARY ANGIOGRAM N/A 02/03/2014   Procedure: LEFT HEART CATHETERIZATION WITH CORONARY ANGIOGRAM;  Surgeon: Pixie Casino, MD;  Location: [facility];  Service: Cardiovascular;  Laterality: N/A;  . left leg stent       Current Outpatient Prescriptions  Medication Sig Dispense Refill  . aspirin 81 MG chewable tablet Chew 1 tablet (81 mg total) by mouth daily. 30 tablet 0  . atorvastatin (LIPITOR) 80 MG tablet Take 1 tablet (80 mg total) by mouth daily. 90 tablet 3  . carvedilol (COREG) 25 MG tablet Take 1 tablet (25 mg total) by mouth 2 (two) times daily with a meal. 180 tablet 3  . clopidogrel (PLAVIX) 75 MG tablet Take 75 mg by mouth daily.     . famotidine (PEPCID) 40 MG tablet Take 1 tablet (40 mg total) by mouth at bedtime. (Patient taking differently: Take 40 mg by mouth daily. ) 30 tablet 2  . furosemide (LASIX) 40 MG tablet Take 1 tablet (40 mg total) by mouth 2 (two) times daily. 180 tablet 3  . glipiZIDE (GLUCOTROL) 10 MG tablet Take 10 mg by mouth 2 (two) times daily.  4  . hydrALAZINE (APRESOLINE) 50 MG tablet Take 1 tablet (50 mg total) by mouth 2 (three) times daily. 270 tablet 1  . Insulin Detemir (LEVEMIR) 100 UNIT/ML Pen Inject 20 Units into the skin daily at 10 pm. 15 mL 3  . Insulin Pen Needle 31G X 5 MM MISC Use 1 needle daily to inject insulin as prescribed 100 each 2  . isosorbide dinitrate (ISORDIL) 30 MG tablet Take 1 tablet (30 mg total) by mouth 2 (two) times daily. (Patient taking differently: Take 30 mg by mouth every morning. ) 60 tablet 4  . LYRICA 75 MG capsule Take 75 mg by mouth 2 (two) times daily.  2  . metFORMIN (GLUCOPHAGE-XR) 500 MG 24 hr tablet Take 2 tablets (1,000 mg total) by mouth 2 (two) times daily.    . nitroGLYCERIN (NITROSTAT) 0.4 MG SL tablet Place 0.4 mg under the tongue every 5 (five) minutes as needed for chest pain.  4  . ondansetron (ZOFRAN ODT) 4 MG disintegrating tablet Take 1 tablet (4 mg total) by mouth every 8 (eight) hours as needed for nausea or vomiting. 20 tablet 0  . Oxycodone HCl 10 MG TABS Take 10 mg by mouth 2 (two) times daily as needed (for pain).   0  . pantoprazole (PROTONIX) 40 MG tablet Take 1 tablet (40 mg total) by mouth 2 (two) times daily. 60 tablet 1  . potassium chloride SA (K-DUR,KLOR-CON) 20 MEQ tablet Take 20 mEq by mouth daily.    . sertraline (ZOLOFT) 50 MG tablet Take 50 mg by mouth daily.    . sitaGLIPtin (JANUVIA) 100 MG tablet Take 100 mg by mouth daily.    Marland Kitchen tiZANidine (ZANAFLEX) 2 MG tablet Take 2 mg by mouth every 6 (six) hours as needed for muscle spasms.    . traZODone (DESYREL) 50 MG tablet Take 1 tablet (50 mg total) by mouth at bedtime. 30  tablet 11   No current facility-administered medications for this visit.     Allergies:   Coconut flavor [flavoring agent]; Coconut oil; and Ibuprofen    Social History:  The patient  reports that he has never smoked. He has never used smokeless tobacco. He reports that he does not drink alcohol or use drugs.   Family History:  The patient's family history includes Breast cancer  in his mother; Cancer in his father; Diabetes in his brother, mother, and sister; Hypertension in his brother, mother, and sister.    ROS:  Please see the history of present illness. All other systems are reviewed and negative.    PHYSICAL EXAM: VS:  BP (!) 150/74   Pulse 84   Ht 5\' 10"  (1.778 m)   Wt (!) 325 lb (147.4 kg)   BMI 46.63 kg/m  , BMI Body mass index is 46.63 kg/m. GEN: Well nourished, well developed, male in no acute distress  HEENT: normal for age  Neck: no JVD seen, difficult to evaluate secondary to body habitus, no carotid bruit, no masses Cardiac: RRR; soft murmur, no rubs, or gallops Respiratory:  clear to auscultation bilaterally, normal work of breathing GI: soft, nontender, nondistended, + BS MS: no deformity or atrophy; trace edema; distal pulses are 2+ in all 4 extremities; compression socks are in place   Skin: warm and dry, no rash Neuro:  Strength and sensation are intact Psych: euthymic mood, full affect   EKG:  EKG is not ordered today.  Echocardiogram: 06/30/2017 - Left ventricle: The cavity size was normal. Wall thickness was   increased in a pattern of mild LVH. Systolic function was normal.   The estimated ejection fraction was in the range of 50% to 55%.   Wall motion was normal; there were no regional wall motion   abnormalities. Features are consistent with a pseudonormal left   ventricular filling pattern, with concomitant abnormal relaxation   and increased filling pressure (grade 2 diastolic dysfunction). - Aortic valve: Valve area (VTI): 2.5 cm^2. Valve  area (Vmax): 2.33   cm^2. Valve area (Vmean): 2.28 cm^2.  CATH: 05/31/2016  Mid RCA lesion, 25 %stenosed.  A STENT SYNERGY DES 4X16 drug eluting stent was successfully placed.  Mid LAD lesion, 70 %stenosed.  Post intervention, there is a 0% residual stenosis.  1. Single vessel obstructive CAD. 70% mid LAD with abnormal FFR of 0.67.  2. Successful stenting of the mid LAD with DES 3. Elevated LVEDP Plan: DAPT for one year. Anticipate DC in am.    Recent Labs: 06/29/2017: ALT 22; B Natriuretic Peptide 19.4; BUN 8; Creatinine, Ser 0.79; Hemoglobin 11.3; Platelets 235; Potassium 4.3; Sodium 133    Lipid Panel    Component Value Date/Time   CHOL 81 06/18/2017 0558   TRIG 255 (H) 06/18/2017 0558   HDL 28 (L) 06/18/2017 0558   CHOLHDL 2.9 06/18/2017 0558   VLDL 51 (H) 06/18/2017 0558   LDLCALC 2 06/18/2017 0558     Wt Readings from Last 3 Encounters:  07/11/17 (!) 325 lb (147.4 kg)  06/30/17 (!) 314 lb 12.8 oz (142.8 kg)  06/17/17 (!) 324 lb 15.3 oz (147.4 kg)     Other studies Reviewed: Additional studies/ records that were reviewed today include: Office notes, hospital records and testing.  ASSESSMENT AND PLAN:  1.  Chronic diastolic CHF: According to the patient his weight goes up and down quite a bit. He is not paying that much attention to the amount of sodium in the food that he eats. However, he currently feels that his breathing is about at baseline. He has chronic dyspnea on exertion. He does not have a way to weigh himself, but hopes to get a scale given to him by an organization that has contacted him.  He is encouraged to be diet compliant and weigh himself as he is able. If he can get support socks or compression  socks that go up to his knees, that would also be helpful.  2. Hypertension: His blood pressure is elevated today. He has had his morning medications, but did not take them yesterday. I doubt his blood pressures controlled at baseline. He states he can  take his hydralazine 3 times a day. We will increase him from 50 mg twice a day to 3 times a day and he is to follow-up with primary care as well as cardiology for this.  3. CAD: He is not very active, but is not having chest pain with activity. He is on aspirin, high-dose beta blocker, Plavix, high-dose statin and Imdur. His EF is low normal. No medication changes.  4. Chest pain: His symptoms are atypical. I explained that he has multiple areas of musculoskeletal pain and the most likely culprit for his chest pain is also musculoskeletal pain. He is encouraged to keep treating his symptoms as directed.   Current medicines are reviewed at length with the patient today.  The patient does not have concerns regarding medicines.  The following changes have been made:  Increase hydralazine  Labs/ tests ordered today include:  No orders of the defined types were placed in this encounter.    Disposition:   FU with Dr. Stanford Breed  Signed, Calynn Ferrero, [address]  07/11/2017 3:12 PM    [place] Phone: [phone]; Fax: [phone]  This note was written with the assistance of speech recognition software. Please excuse any transcriptional errors.

## 2017-07-12 ENCOUNTER — Ambulatory Visit (HOSPITAL_BASED_OUTPATIENT_CLINIC_OR_DEPARTMENT_OTHER): Payer: Medicaid Other | Admitting: Oncology

## 2017-07-12 VITALS — BP 155/81 | HR 71 | Temp 98.5°F | Resp 18 | Ht 70.0 in | Wt 325.4 lb

## 2017-07-12 DIAGNOSIS — D649 Anemia, unspecified: Secondary | ICD-10-CM | POA: Diagnosis not present

## 2017-07-12 NOTE — Progress Notes (Signed)
Reason for Referral: Anemia.   HPI: [name] is a 56 year old gentleman currently of [place] where he lived the majority of his life. He is a gentleman with history of coronary disease, hypertension, obesity as well as a few other comorbid conditions. He was hospitalized on 2 separate occasions between August and September 2018 for complaints of chest pain as well as TIA was noted in August 2018. During that time he was noted to be mildly anemic with a hemoglobin of 11.2. His white cell count, platelet count and indices are all within normal range. He had a GI workup in the past including colonoscopy and in no scoop he was unrevealing. He denied any hematochezia or melena. He denied any excessive fatigue or tiredness. He does report chest pain and dyspnea on exertion as well as chronic back pain related to his spinal stenosis. Despite these symptoms, he still able to drive and attends to activities of daily living. His performance status and activity level has not changed. His most recent CBC on 06/29/2017 showed a hemoglobin 11.3, white cell count of 8.8, platelet count of 235. She had a normal differential at that time.  He does not report any headaches, blurry vision, syncope or seizures. He does not report any fevers, chills or sweats. He does not report any cough, wheezing or hemoptysis. He does not report any nausea, vomiting or abdominal pain. He does not report any frequency urgency or hesitancy. He is reporting skeletal complaints. Remaining review of systems unremarkable.   Past Medical History:  Diagnosis Date  . Arthritis   . Back pain   . CAD (coronary artery disease)    a. s/p DES to LAD in 05/2016  . DVT (deep venous thrombosis) ([place])   . Hyperlipidemia   . Hypertension   . IBS (irritable bowel syndrome)   . OSA (obstructive sleep apnea)   . Pancreatitis   . PE (pulmonary thromboembolism) ([place])   . PUD (peptic ulcer disease)   . Renal disorder   . Stroke ([place])   . Type 2  diabetes mellitus ([place])   :  Past Surgical History:  Procedure Laterality Date  . CARDIAC CATHETERIZATION N/A 05/31/2016   Procedure: Left Heart Cath and Coronary Angiography;  Surgeon: Peter M Martinique, MD;  Location: [facility];  Service: Cardiovascular;  Laterality: N/A;  . CARDIAC CATHETERIZATION N/A 05/31/2016   Procedure: Intravascular Pressure Wire/FFR Study;  Surgeon: Peter M Martinique, MD;  Location: [facility];  Service: Cardiovascular;  Laterality: N/A;  . CARDIAC CATHETERIZATION N/A 05/31/2016   Procedure: Coronary Stent Intervention;  Surgeon: Peter M Martinique, MD;  Location: [facility];  Service: Cardiovascular;  Laterality: N/A;  . LEFT HEART CATHETERIZATION WITH CORONARY ANGIOGRAM N/A 02/03/2014   Procedure: LEFT HEART CATHETERIZATION WITH CORONARY ANGIOGRAM;  Surgeon: Pixie Casino, MD;  Location: [facility];  Service: Cardiovascular;  Laterality: N/A;  . left leg stent     :   Current Outpatient Prescriptions:  .  aspirin 81 MG chewable tablet, Chew 1 tablet (81 mg total) by mouth daily., Disp: 30 tablet, Rfl: 0 .  atorvastatin (LIPITOR) 80 MG tablet, Take 1 tablet (80 mg total) by mouth daily., Disp: 90 tablet, Rfl: 3 .  carvedilol (COREG) 25 MG tablet, Take 1 tablet (25 mg total) by mouth 2 (two) times daily with a meal., Disp: 180 tablet, Rfl: 3 .  clopidogrel (PLAVIX) 75 MG tablet, Take 75 mg by mouth daily., Disp: , Rfl:  .  famotidine (PEPCID) 40 MG tablet, Take 1 tablet (40 mg total) by mouth at bedtime. (Patient taking differently: Take 40 mg by mouth daily. ), Disp: 30 tablet, Rfl: 2 .  furosemide (LASIX) 40 MG tablet, Take 1 tablet (40 mg total) by mouth 2 (two) times daily., Disp: 180 tablet, Rfl: 3 .  glipiZIDE (GLUCOTROL) 10 MG tablet, Take 10 mg by mouth 2 (two) times daily., Disp: , Rfl: 4 .  hydrALAZINE (APRESOLINE) 50 MG tablet, Take 1 tablet (50 mg total) by mouth 3 (three) times daily., Disp: 270 tablet, Rfl: 1 .  Insulin Detemir (LEVEMIR)  100 UNIT/ML Pen, Inject 20 Units into the skin daily at 10 pm., Disp: 15 mL, Rfl: 3 .  Insulin Pen Needle 31G X 5 MM MISC, Use 1 needle daily to inject insulin as prescribed, Disp: 100 each, Rfl: 2 .  isosorbide dinitrate (ISORDIL) 30 MG tablet, Take 1 tablet (30 mg total) by mouth 2 (two) times daily. (Patient taking differently: Take 30 mg by mouth every morning. ), Disp: 60 tablet, Rfl: 4 .  LYRICA 75 MG capsule, Take 75 mg by mouth 2 (two) times daily., Disp: , Rfl: 2 .  metFORMIN (GLUCOPHAGE-XR) 500 MG 24 hr tablet, Take 2 tablets (1,000 mg total) by mouth 2 (two) times daily., Disp: , Rfl:  .  nitroGLYCERIN (NITROSTAT) 0.4 MG SL tablet, Place 0.4 mg under the tongue every 5 (five) minutes as needed for chest pain., Disp: , Rfl: 4 .  ondansetron (ZOFRAN ODT) 4 MG disintegrating tablet, Take 1 tablet (4 mg total) by mouth every 8 (eight) hours as needed for nausea or vomiting., Disp: 20 tablet, Rfl: 0 .  Oxycodone HCl 10 MG TABS, Take 10 mg by mouth 2 (two) times daily as needed (for pain). , Disp: , Rfl: 0 .  pantoprazole (PROTONIX) 40 MG tablet, Take 1 tablet (40 mg total) by mouth 2 (two) times daily., Disp: 60 tablet, Rfl: 1 .  potassium chloride SA (K-DUR,KLOR-CON) 20 MEQ tablet, Take 20 mEq by mouth daily., Disp: , Rfl:  .  sertraline (ZOLOFT) 50 MG tablet, Take 50 mg by mouth daily., Disp: , Rfl:  .  sitaGLIPtin (JANUVIA) 100 MG tablet, Take 100 mg by mouth daily., Disp: , Rfl:  .  tiZANidine (ZANAFLEX) 2 MG tablet, Take 2 mg by mouth every 6 (six) hours as needed for muscle spasms., Disp: , Rfl:  .  traZODone (DESYREL) 50 MG tablet, Take 1 tablet (50 mg total) by mouth at bedtime., Disp: 30 tablet, Rfl: 11:  Allergies  Allergen Reactions  . Coconut Flavor [Flavoring Agent] Hives  . Coconut Oil Hives  . Ibuprofen Other (See Comments)    Made gastric ulcers worse  :  Family History  Problem Relation Age of Onset  . Cancer Father   . Hypertension Mother   . Diabetes Mother   .  Breast cancer Mother   . Hypertension Brother   . Diabetes Brother   . Hypertension Sister   . Diabetes Sister   :  Social History   Social History  . Marital status: Single    Spouse name: N/A  . Number of children: 3  . Years of education: 12   Occupational History  . Scientist, product/process development   Social History Main Topics  . Smoking status: Never Smoker  . Smokeless tobacco: Never Used  . Alcohol use No  . Drug use: No  . Sexual activity: Not on file   Other Topics Concern  .  Not on file   Social History Narrative   Lives in an ALF in [place]. Independent and ambulatory with cane.   :  Pertinent items are noted in HPI.  Exam: Blood pressure (!) 155/81, pulse 71, temperature 98.5 F (36.9 C), temperature source Oral, resp. rate 18, height 5\' 10"  (1.778 m), weight (!) 325 lb 6.4 oz (147.6 kg), SpO2 97 %.  ECOG 1  General appearance: alert and cooperative and no distress. Throat: No oral thrush. Neck: no adenopathy Back: negative Resp: clear to auscultation bilaterally dullness to percussion. Chest wall: no tenderness Cardio: regular rate and rhythm, S1, S2 normal, no murmur, click, rub or gallop GI: soft, non-tender; bowel sounds normal; no masses,  no organomegaly Extremities: extremities normal, atraumatic, no cyanosis or edema Pulses: 2+ and symmetric Skin: Skin color, texture, turgor normal. No rashes or lesions  CBC    Component Value Date/Time   WBC 8.8 06/29/2017 2254   RBC 4.01 (L) 06/29/2017 2254   HGB 11.3 (L) 06/29/2017 2254   HCT 35.5 (L) 06/29/2017 2254   PLT 235 06/29/2017 2254   MCV 88.5 06/29/2017 2254   MCH 28.2 06/29/2017 2254   MCHC 31.8 06/29/2017 2254   RDW 14.4 06/29/2017 2254   LYMPHSABS 2.6 06/17/2017 0051   MONOABS 0.7 06/17/2017 0051   EOSABS 0.1 06/17/2017 0051   BASOSABS 0.0 06/17/2017 0051     Chemistry      Component Value Date/Time   NA 133 (L) 06/29/2017 2044   NA 141 12/07/2016 1013   K 4.3 06/29/2017  2044   CL 96 (L) 06/29/2017 2044   CO2 23 06/29/2017 2044   BUN 8 06/29/2017 2044   BUN 9 12/07/2016 1013   CREATININE 0.79 06/29/2017 2044   CREATININE 0.92 08/30/2016 1526      Component Value Date/Time   CALCIUM 8.5 (L) 06/29/2017 2044   ALKPHOS 37 (L) 06/29/2017 2107   AST 31 06/29/2017 2107   ALT 22 06/29/2017 2107   BILITOT 0.7 06/29/2017 2107        Ct Angio Head W Or Wo Contrast  Result Date: 06/17/2017 CLINICAL DATA:  56 year old male code stroke presentation with left side weakness. EXAM: CT ANGIOGRAPHY HEAD AND NECK TECHNIQUE: Multidetector CT imaging of the head and neck was performed using the standard protocol during bolus administration of intravenous contrast. Multiplanar CT image reconstructions and MIPs were obtained to evaluate the vascular anatomy. Carotid stenosis measurements (when applicable) are obtained utilizing NASCET criteria, using the distal internal carotid diameter as the denominator. CONTRAST:  50 mL Isovue 370 COMPARISON:  Head CT without contrast 0057 hours today, and earlier. Brain MRI 04/01/2014. Chest CTA 06/27/2016. FINDINGS: CTA NECK Skeleton: No acute osseous abnormality identified. Degenerative osteophytes and occasional interbody ankylosis in the spine. Upper chest: Mediastinal lipomatosis. Enlarged central pulmonary artery (estimated at 43 mm diameter versus adjacent ascending aorta of 41 mm). Atelectatic changes to the trachea. Negative visible lung parenchyma. No superior mediastinal lymphadenopathy. Other neck: Negative.  No cervical lymphadenopathy. Aortic arch: Suboptimal contrast bolus, with significant reflux of venous contrast into both internal jugular veins, and left chest wall venous collaterals. There is no significant central venous stenosis visible on these images. Three vessel arch configuration with minimal arch atherosclerosis. Right carotid system: Mild soft plaque in the right CCA without stenosis. Soft and calcified plaque at the  right carotid bifurcation and posterior right ICA origin and bulb with less than 50 % stenosis with respect to the distal vessel. Left carotid system:  The left CCA at the level of the hypopharynx is obscured by dense streak artifact from contrast reflux into the left IJ. The visible left CCA is patent without stenosis. Left carotid bifurcation is patent without stenosis. Partially retropharyngeal course of the left ICA in the neck. Vertebral arteries: No proximal right subclavian artery stenosis. Right vertebral artery origin appears normal. Suboptimal proximal V 2 contrast, but no right vertebral artery stenosis identified in the neck. No proximal left subclavian artery stenosis. Suboptimal proximal left vertebral artery contrast, but the left vertebral artery origin and proximal vessel appear grossly normal. The distal left vertebral artery is patent without stenosis. CTA HEAD Posterior circulation: Patent distal vertebral arteries and vertebrobasilar junction with atherosclerosis but no definite hemodynamically significant stenosis. Patent basilar artery without stenosis. SCA and PCA origins are patent. Posterior communicating arteries are diminutive or absent. Bilateral PCA branches are regular but patent. Anterior circulation: Both ICA siphons are patent. Moderate left and mild right ICA siphon calcified plaque. No significant siphon stenosis. Patent carotid termini. Patent MCA and ACA origins. Both MCA M1 segments are patent but irregular compatible with atherosclerosis. Allowing for the suboptimal contrast bolus timing, no left MCA branch occlusion is identified. Dominant right ACA A1 segment. Anterior communicating artery and bilateral ACA branches are patent with mild to moderate A2 segment irregularity. Venous sinuses: Early contrast timing, not evaluated. Anatomic variants: Dominant right A1 segment. Review of the MIP images confirms the above findings IMPRESSION: 1. Suboptimal arterial contrast timing.  Negative for large vessel occlusion. This was preliminarily discussed by telephone with Dr. Kerney Elbe on 06/17/2017 at 0118 hours. 2. Widespread intracranial atherosclerosis, including involvement of the bilateral M1 segments. No MCA branch occlusion identified. 3. No hemodynamically significant stenosis identified in the neck. 4. Chronically enlarged main pulmonary artery suggesting pulmonary artery hypertension. Electronically Signed   By: Genevie Ann M.D.   On: 06/17/2017 01:33     Assessment and Plan:   56 year old gentleman with the following issues:  1. Normocytic normochromic anemia detected in August 2018. His hemoglobin have ranged between 11.3 and 11.8 with normal range of close to 13. His hemoglobin actually close to normal range in the last 3 years. His GI workup did not reveal any GI blood losses at this time.  The differential diagnosis was discussed today. I see no evidence to suggest acute hemolysis, autoimmune anemia or bone marrow disorder. His white cell count and platelet count all within normal range including a differential. Early myelodysplastic syndrome as a possibility but considered less likely.  Anemia of chronic disease is a possibility given his multiple comorbid conditions and the bone marrow per suppression associated with some the medication he is taking. From a management standpoint, his anemia is rather mild and asymptomatic. I recommend continued observation and surveillance at this time. I will repeat iron studies, W58, folic acid and a serum protein electrophoresis with the next visit to complete the workup.  2. Follow-up: Will be in 4 months to repeat his laboratory testing.

## 2017-07-13 ENCOUNTER — Telehealth: Payer: Self-pay | Admitting: Oncology

## 2017-07-13 NOTE — Telephone Encounter (Signed)
Spoke with patient regarding appts that were added per 9/19 sch msg. Sending confirmation letter

## 2017-07-14 ENCOUNTER — Encounter: Payer: Medicaid Other | Admitting: Internal Medicine

## 2017-07-14 ENCOUNTER — Encounter: Payer: Self-pay | Admitting: Internal Medicine

## 2017-08-03 ENCOUNTER — Encounter (HOSPITAL_COMMUNITY): Payer: Self-pay | Admitting: Emergency Medicine

## 2017-08-03 DIAGNOSIS — M25561 Pain in right knee: Secondary | ICD-10-CM | POA: Diagnosis not present

## 2017-08-03 DIAGNOSIS — Z8673 Personal history of transient ischemic attack (TIA), and cerebral infarction without residual deficits: Secondary | ICD-10-CM | POA: Diagnosis not present

## 2017-08-03 DIAGNOSIS — Z7982 Long term (current) use of aspirin: Secondary | ICD-10-CM | POA: Insufficient documentation

## 2017-08-03 DIAGNOSIS — M25551 Pain in right hip: Secondary | ICD-10-CM | POA: Diagnosis present

## 2017-08-03 DIAGNOSIS — E119 Type 2 diabetes mellitus without complications: Secondary | ICD-10-CM | POA: Insufficient documentation

## 2017-08-03 DIAGNOSIS — Z794 Long term (current) use of insulin: Secondary | ICD-10-CM | POA: Insufficient documentation

## 2017-08-03 DIAGNOSIS — I10 Essential (primary) hypertension: Secondary | ICD-10-CM | POA: Diagnosis not present

## 2017-08-03 DIAGNOSIS — Z79899 Other long term (current) drug therapy: Secondary | ICD-10-CM | POA: Insufficient documentation

## 2017-08-03 DIAGNOSIS — I2510 Atherosclerotic heart disease of native coronary artery without angina pectoris: Secondary | ICD-10-CM | POA: Insufficient documentation

## 2017-08-03 DIAGNOSIS — Z7902 Long term (current) use of antithrombotics/antiplatelets: Secondary | ICD-10-CM | POA: Insufficient documentation

## 2017-08-03 NOTE — ED Triage Notes (Signed)
Patient from home, states that he fell and landed on right knee, tried to stand again and fell again and landed on right knee and right hip.  Patient having pain with walking.

## 2017-08-04 ENCOUNTER — Emergency Department (HOSPITAL_COMMUNITY): Payer: Medicaid Other

## 2017-08-04 ENCOUNTER — Emergency Department (HOSPITAL_COMMUNITY)
Admission: EM | Admit: 2017-08-04 | Discharge: 2017-08-04 | Disposition: A | Discharge status: Home / Self Care | Payer: Medicaid Other | Attending: Emergency Medicine | Admitting: Emergency Medicine

## 2017-08-04 DIAGNOSIS — W19XXXA Unspecified fall, initial encounter: Secondary | ICD-10-CM

## 2017-08-04 DIAGNOSIS — M79604 Pain in right leg: Secondary | ICD-10-CM

## 2017-08-04 LAB — CBG MONITORING, ED: Glucose-Capillary: 199 mg/dL — ABNORMAL HIGH (ref 65–99)

## 2017-08-04 MED ORDER — KETOROLAC TROMETHAMINE 60 MG/2ML IM SOLN
60.0000 mg | Freq: Once | INTRAMUSCULAR | Status: AC
  Administered 2017-08-04: 60 mg via INTRAMUSCULAR
  Filled 2017-08-04: qty 2

## 2017-08-04 NOTE — ED Notes (Signed)
Pt verbalizes understanding of d/c instructions. Pt ambulatory at discharge with all belongings.

## 2017-08-04 NOTE — ED Provider Notes (Signed)
[facility] DEPT Provider Note   CSN: [id number] Arrival date & time: 08/03/17  2313     History   Chief Complaint Chief Complaint  Patient presents with  . Fall  . Leg Pain    HPI [name] is a 56 y.o. male with a hx of arthritis, back pain, coronary DVT, hypertension presents to the Emergency Department complaining of chronic right-sided hip pain which caused him to fall tonight. Patient reports that he was walking when his legs gave out and he fell, striking his right knee and hip. He denies loss of consciousness, hitting his head, chest pain or shortness of breath. Patient reports after attempting to get up he fell a second time onto his hip. He states that he has been able to bear weight on the hip since that time but it is painful. Patient reports he takes oxycodone 15 mg and tizanidine for his pain at home. He reports that his big concern is a possible fracture. He requests Toradol for pain control. He denies numbness, tingling, persistent weakness, loss of bowel or bladder control. Patient does report associated low back pain but reports this is chronic and not worse than usual. Low back pain is located on the right side and radiates through his buttock and into his leg. He states no change in this pain. He reports that he normally walks with a walker or cane due to his severe pain.  These problems are being managed by an orthopedist.  The history is provided by the patient and medical records. No language interpreter was used.    Past Medical History:  Diagnosis Date  . Arthritis   . Back pain   . CAD (coronary artery disease)    a. s/p DES to LAD in 05/2016  . DVT (deep venous thrombosis) ([place])   . Hyperlipidemia   . Hypertension   . IBS (irritable bowel syndrome)   . OSA (obstructive sleep apnea)   . Pancreatitis   . PE (pulmonary thromboembolism) ([place])   . PUD (peptic ulcer disease)   . Renal disorder   . Stroke ([place])   . Type 2 diabetes mellitus [facility])       Patient Active Problem List   Diagnosis Date Noted  . GERD (gastroesophageal reflux disease) 06/29/2017  . Left arm numbness   . Cerebral embolism with cerebral infarction 06/17/2017  . TIA (transient ischemic attack) 06/17/2017  . Chronic back pain 12/29/2016  . Depression 12/15/2016  . Vitamin D deficiency 12/09/2016  . Right hip pain 12/07/2016  . Hypokalemia 12/07/2016  . Type 2 diabetes mellitus with vascular disease ([place]) 05/31/2016  . Normocytic normochromic anemia 05/31/2016  . Chest pain 05/31/2016  . CAD (coronary artery disease) 01/06/2016  . DVT (deep venous thrombosis) ([place]) 01/06/2016  . Lactic acidosis 05/28/2014  . Nonspecific chest pain 01/29/2014  . Uncontrolled secondary diabetes with peripheral neuropathy ([place]) 01/29/2014  . Obesity, Class III, BMI 40-49.9 (morbid obesity) ([place]) 01/29/2014  . Snoring 01/29/2014  . Dyslipidemia 01/29/2014  . HTN (hypertension) 01/29/2014  . Abnormal nuclear stress test 01/29/2014    Past Surgical History:  Procedure Laterality Date  . CARDIAC CATHETERIZATION N/A 05/31/2016   Procedure: Left Heart Cath and Coronary Angiography;  Surgeon: Peter M Martinique, MD;  Location: [facility] CV LAB;  Service: Cardiovascular;  Laterality: N/A;  . CARDIAC CATHETERIZATION N/A 05/31/2016   Procedure: Intravascular Pressure Wire/FFR Study;  Surgeon: Peter M Martinique, MD;  Location: [facility] CV LAB;  Service: Cardiovascular;  Laterality:  N/A;  . CARDIAC CATHETERIZATION N/A 05/31/2016   Procedure: Coronary Stent Intervention;  Surgeon: Peter M Martinique, MD;  Location: [facility] CV LAB;  Service: Cardiovascular;  Laterality: N/A;  . LEFT HEART CATHETERIZATION WITH CORONARY ANGIOGRAM N/A 02/03/2014   Procedure: LEFT HEART CATHETERIZATION WITH CORONARY ANGIOGRAM;  Surgeon: Pixie Casino, MD;  Location: [facility] CATH LAB;  Service: Cardiovascular;  Laterality: N/A;  . left leg stent          Home Medications    Prior to Admission medications    Medication Sig Start Date End Date Taking? Authorizing Provider  aspirin 81 MG chewable tablet Chew 1 tablet (81 mg total) by mouth daily. 06/01/16   Elwin Mocha, MD  atorvastatin (LIPITOR) 80 MG tablet Take 1 tablet (80 mg total) by mouth daily. 01/26/17   Ahmed Prima, Fransisco Hertz, PA-C  carvedilol (COREG) 25 MG tablet Take 1 tablet (25 mg total) by mouth 2 (two) times daily with a meal. 01/26/17   Strader, [place] M, PA-C  clopidogrel (PLAVIX) 75 MG tablet Take 75 mg by mouth daily.    [provider]  famotidine (PEPCID) 40 MG tablet Take 1 tablet (40 mg total) by mouth at bedtime. Patient taking differently: Take 40 mg by mouth daily.  06/18/17   Barton Dubois, MD  furosemide (LASIX) 40 MG tablet Take 1 tablet (40 mg total) by mouth 2 (two) times daily. 01/26/17   Strader, Fransisco Hertz, PA-C  glipiZIDE (GLUCOTROL) 10 MG tablet Take 10 mg by mouth 2 (two) times daily. 05/30/17   [provider]  hydrALAZINE (APRESOLINE) 50 MG tablet Take 1 tablet (50 mg total) by mouth 3 (three) times daily. 07/11/17   Barrett, Evelene Croon, PA-C  Insulin Detemir (LEVEMIR) 100 UNIT/ML Pen Inject 20 Units into the skin daily at 10 pm. 06/18/17   Barton Dubois, MD  Insulin Pen Needle 31G X 5 MM MISC Use 1 needle daily to inject insulin as prescribed 06/18/17   Barton Dubois, MD  isosorbide dinitrate (ISORDIL) 30 MG tablet Take 1 tablet (30 mg total) by mouth 2 (two) times daily. Patient taking differently: Take 30 mg by mouth every morning.  11/22/16   Strader, Fransisco Hertz, PA-C  LYRICA 75 MG capsule Take 75 mg by mouth 2 (two) times daily. 04/03/17   [provider]  metFORMIN (GLUCOPHAGE-XR) 500 MG 24 hr tablet Take 2 tablets (1,000 mg total) by mouth 2 (two) times daily. 07/02/17   Barton Dubois, MD  nitroGLYCERIN (NITROSTAT) 0.4 MG SL tablet Place 0.4 mg under the tongue every 5 (five) minutes as needed for chest pain. 12/17/16   [provider]  ondansetron (ZOFRAN ODT) 4 MG disintegrating  tablet Take 1 tablet (4 mg total) by mouth every 8 (eight) hours as needed for nausea or vomiting. 06/09/17   Varney Biles, MD  Oxycodone HCl 10 MG TABS Take 10 mg by mouth 2 (two) times daily as needed (for pain).  04/18/17   [provider]  pantoprazole (PROTONIX) 40 MG tablet Take 1 tablet (40 mg total) by mouth 2 (two) times daily. 06/18/17   Barton Dubois, MD  potassium chloride SA (K-DUR,KLOR-CON) 20 MEQ tablet Take 20 mEq by mouth daily.    [provider]  sertraline (ZOLOFT) 50 MG tablet Take 50 mg by mouth daily.    [provider]  sitaGLIPtin (JANUVIA) 100 MG tablet Take 100 mg by mouth daily.    [provider]  tiZANidine (ZANAFLEX) 2 MG tablet Take 2  mg by mouth every 6 (six) hours as needed for muscle spasms.    [provider]  traZODone (DESYREL) 50 MG tablet Take 1 tablet (50 mg total) by mouth at bedtime. 12/15/16   Florinda Marker, MD    Family History Family History  Problem Relation Age of Onset  . Cancer Father   . Hypertension Mother   . Diabetes Mother   . Breast cancer Mother   . Hypertension Brother   . Diabetes Brother   . Hypertension Sister   . Diabetes Sister     Social History Social History  Substance Use Topics  . Smoking status: Never Smoker  . Smokeless tobacco: Never Used  . Alcohol use No     Allergies   Coconut flavor [flavoring agent]; Coconut oil; and Ibuprofen   Review of Systems Review of Systems  Constitutional: Negative for chills and fever.  Gastrointestinal: Negative for nausea and vomiting.  Musculoskeletal: Positive for arthralgias. Negative for back pain, joint swelling, neck pain and neck stiffness.  Skin: Negative for wound.  Neurological: Negative for numbness.  Hematological: Does not bruise/bleed easily.  Psychiatric/Behavioral: The patient is not nervous/anxious.   All other systems reviewed and are negative.    Physical Exam Updated Vital Signs BP (!) 141/82    Pulse (!) 58   Temp 97.7 F (36.5 C) (Oral)   Resp 18   SpO2 95%   Physical Exam  Constitutional: He appears well-developed and well-nourished. No distress.  HENT:  Head: Normocephalic and atraumatic.  Mouth/Throat: Oropharynx is clear and moist. No oropharyngeal exudate.  Eyes: Conjunctivae are normal.  Neck: Normal range of motion. Neck supple.  Full ROM without pain  Cardiovascular: Normal rate, regular rhythm and intact distal pulses.   Capillary refill < 3 sec  Pulmonary/Chest: Effort normal and breath sounds normal. No respiratory distress. He has no wheezes.  Abdominal: Soft. He exhibits no distension. There is no tenderness.  Musculoskeletal: He exhibits tenderness. He exhibits no edema.  Baseline range of motion of the T-spine and L-spine No midline tenderness to the  T-spine or L-spine Tenderness to palpation of the right paraspinous muscles of the L-spine and in the right buttock Painful, but baseline ROM to the right hip and knee. Pt is able to ambulate with his cane at baseline.  No peripheral edema  Lymphadenopathy:    He has no cervical adenopathy.  Neurological: He is alert. Coordination normal.  Speech is clear and goal oriented, follows commands Normal 5/5 strength in upper and lower extremities bilaterally including dorsiflexion and plantar flexion, strong and equal grip strength Sensation normal to light and sharp touch Moves extremities without ataxia, coordination intact Antalgic gait Normal balance No Clonus  Skin: Skin is warm and dry. No rash noted. He is not diaphoretic. No erythema.  No tenting of the skin  Psychiatric: He has a normal mood and affect. His behavior is normal.  Nursing note and vitals reviewed.    ED Treatments / Results  Labs (all labs ordered are listed, but only abnormal results are displayed) Labs Reviewed  CBG MONITORING, ED - Abnormal; Notable for the following:       Result Value   Glucose-Capillary 199 (*)    All other  components within normal limits     Radiology Dg Knee Complete 4 Views Right  Result Date: 08/04/2017 CLINICAL DATA:  [place] twice in 1 day, RIGHT hip pain EXAM: RIGHT KNEE - COMPLETE 4+ VIEW COMPARISON:  None FINDINGS: Diffuse osseous demineralization.  Minimal joint space narrowing. Superimposed clothing/bedding artifacts. Mild spur formation at patellofemoral joint. Patellar spur at quadriceps tendon insertion. No acute fracture, dislocation, or bone destruction. IMPRESSION: Mild degenerative changes RIGHT knee. No acute abnormalities. Electronically Signed   By: Lavonia Dana M.D.   On: 08/04/2017 00:57   Dg Hip Unilat  With Pelvis 2-3 Views Right  Result Date: 08/04/2017 CLINICAL DATA:  [place] twice in 1 day, RIGHT hip pain EXAM: DG HIP (WITH OR WITHOUT PELVIS) 2-3V RIGHT COMPARISON:  02/08/2017 FINDINGS: Osseous mineralization normal. SI joints symmetric. RIGHT hip joint space narrowing and spur formation. No acute fracture, dislocation, or bone destruction. IMPRESSION: Degenerative changes RIGHT hip. No acute abnormalities. Electronically Signed   By: Lavonia Dana M.D.   On: 08/04/2017 00:58    Procedures Procedures (including critical care time)  Medications Ordered in ED Medications  ketorolac (TORADOL) injection 60 mg (60 mg Intramuscular Given 08/04/17 0429)     Initial Impression / Assessment and Plan / ED Course  I have reviewed the triage vital signs and the nursing notes.  Pertinent labs & imaging results that were available during my care of the patient were reviewed by me and considered in my medical decision making (see chart for details).     Patient reports after several falls today. No syncope. No saddle anesthesia, numbness, tingling of the lower extremities. No clinical evidence of cauda equina. No loss of bowel or bladder control. No overflow incontinence. Patient is neurovascularly intact in the bilateral lower extremities. X-rays are without acute abnormalities  including no fractures. Significant degenerative arthritis is noted of the right hip and knee. Patient request Toradol for pain control and discharge home. He reports that he has an orthopedist that continues to monitor and manage his pain. Patient is able to ambulate in the room. No evidence of cauda equina. Discussed reasons to return immediately to the emergency department.  Patient states understanding and is in agreement with the plan.  Final Clinical Impressions(s) / ED Diagnoses   Final diagnoses:  Fall, initial encounter  Right leg pain    New Prescriptions Discharge Medication List as of 08/04/2017  4:15 AM       Harlee Pursifull, Jarrett Soho, PA-C 08/04/17 [place], [place], MD 08/11/17 [phone]

## 2017-08-04 NOTE — Discharge Instructions (Signed)
1. Medications: use home oxycodone and tizanidine for pain control, usual home medications 2. Treatment: rest, ice, elevate and use brace, drink plenty of fluids, gentle stretching 3. Follow Up: Please followup with orthopedics as directed or your PCP in 1 week if no improvement for discussion of your diagnoses and further evaluation after today's visit; if you do not have a primary care doctor use the resource guide provided to find one; Please return to the ER for worsening symptoms or other concerns

## 2017-08-04 NOTE — ED Notes (Signed)
Per pt request checked pt CBG 199, RN Woody informed of CBG. Pt also requested some ice water or ice chips ok per [employer]. Pt given the same

## 2017-08-09 ENCOUNTER — Ambulatory Visit (INDEPENDENT_AMBULATORY_CARE_PROVIDER_SITE_OTHER): Payer: Medicaid Other | Admitting: Neurology

## 2017-08-09 ENCOUNTER — Encounter: Payer: Self-pay | Admitting: Neurology

## 2017-08-09 VITALS — BP 131/73 | HR 82 | Ht 70.0 in | Wt 316.0 lb

## 2017-08-09 DIAGNOSIS — R51 Headache: Secondary | ICD-10-CM | POA: Diagnosis not present

## 2017-08-09 DIAGNOSIS — G459 Transient cerebral ischemic attack, unspecified: Secondary | ICD-10-CM | POA: Diagnosis not present

## 2017-08-09 DIAGNOSIS — I2510 Atherosclerotic heart disease of native coronary artery without angina pectoris: Secondary | ICD-10-CM

## 2017-08-09 DIAGNOSIS — R519 Headache, unspecified: Secondary | ICD-10-CM | POA: Insufficient documentation

## 2017-08-09 MED ORDER — PROPRANOLOL HCL ER 80 MG PO CP24: 80.0000 mg | ORAL_CAPSULE | Freq: Every day | ORAL | 11 refills | Status: DC

## 2017-08-09 MED ORDER — NAPROXEN 500 MG PO TABS: 500.0000 mg | ORAL_TABLET | Freq: Two times a day (BID) | ORAL | 11 refills | Status: DC

## 2017-08-09 NOTE — Progress Notes (Signed)
PATIENT: [name] DOB: [birth year]  Chief Complaint  Patient presents with  . Headache    He is here with his case manager, [name].  Reports daily headaches that vary in severity.  He will sometimes have dizziness, nausea and vision difficulty.  He takes oxycodone and tizanidine for his most severe pain.  These medications tend to improve his headaches to a tolerable level.  He has history of strokes with his most recent being September 2018.  Marland Kitchen PCP    Juan Blocker, MD     [place] [name] is a 56 year old male, seen in refer by his primary care doctor  [name] for evaluation of headaches, initial evaluation was on August 09 2017. He came in with case worker.  I reviewed and summarized in note, he has history of hyperlipidemia, diabetes since 2012, vitamin D deficiency, obesity, coronary artery disease, stroke in August 2017, He presented with blurred vision, could not breath, pancreatitis,   He began to have headaches since 2017, starting from occipital region, pressure sensation, spreading forward to become bilateral frontal headaches, mild blurry vision, pressure pain, radiating his ear, he denies significant light noise sensitivity, goes all day long, take NSAIDs, muscle relaxant, which has been helpful,  He also complains of chronic neck pain, multiple joints pain, low back pain, hip pain, radiating pain to bilateral upper extremity, and hands, paresthesia,  Laboratory evaluations in June 2018, CMP, creatinine of 0.62, CBC hemoglobin of 12.5, A1c was elevated 9.4,  Personally reviewed MRI of the brain June 17 2017, no acute abnormality.  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, due to disc protrusion, posterior longitudinal ligament ossification, no cord signal change, no significant foraminal narrowing  CT angiogram of the chest, no evidence of pulmonary emboli, stable ectatic ascending thoracic aorta are   Labs, September 2018, UDS  was positive for opiates, CBC hemoglobin of 11.3, normal BNP, negative troponin, lipase was 32, lipid profile, LDL was 2, triglyceride 255,     CT angiogram of the head and neck showed no large vessel disease, evidence of intracranial atherosclerotic disease  REVIEW OF SYSTEMS: Full 14 system review of systems performed and notable only for gait abnormality due to knee pain,  ALLERGIES: Allergies  Allergen Reactions  . Coconut Flavor [Flavoring Agent] Hives  . Coconut Oil Hives  . Ibuprofen Other (See Comments)    Made gastric ulcers worse    HOME MEDICATIONS: Current Outpatient Prescriptions  Medication Sig Dispense Refill  . aspirin 81 MG chewable tablet Chew 1 tablet (81 mg total) by mouth daily. 30 tablet 0  . atorvastatin (LIPITOR) 80 MG tablet Take 1 tablet (80 mg total) by mouth daily. 90 tablet 3  . carvedilol (COREG) 25 MG tablet Take 1 tablet (25 mg total) by mouth 2 (two) times daily with a meal. 180 tablet 3  . clopidogrel (PLAVIX) 75 MG tablet Take 75 mg by mouth daily.    . famotidine (PEPCID) 40 MG tablet Take 1 tablet (40 mg total) by mouth at bedtime. (Patient taking differently: Take 40 mg by mouth daily. ) 30 tablet 2  . furosemide (LASIX) 40 MG tablet Take 1 tablet (40 mg total) by mouth 2 (two) times daily. 180 tablet 3  . glipiZIDE (GLUCOTROL) 10 MG tablet Take 10 mg by mouth 2 (two) times daily.  4  . hydrALAZINE (APRESOLINE) 50 MG tablet Take 1 tablet (50 mg total) by mouth 3 (three) times daily. [name]  tablet 1  . Insulin Detemir (LEVEMIR) 100 UNIT/ML Pen Inject 20 Units into the skin daily at 10 pm. 15 mL 3  . Insulin Pen Needle 31G X 5 MM MISC Use 1 needle daily to inject insulin as prescribed 100 each 2  . isosorbide dinitrate (ISORDIL) 30 MG tablet Take 1 tablet (30 mg total) by mouth 2 (two) times daily. (Patient taking differently: Take 30 mg by mouth every morning. ) 60 tablet 4  . LYRICA 75 MG capsule Take 75 mg by mouth 2 (two) times daily.  2  . metFORMIN  (GLUCOPHAGE-XR) 500 MG 24 hr tablet Take 2 tablets (1,000 mg total) by mouth 2 (two) times daily.    . nitroGLYCERIN (NITROSTAT) 0.4 MG SL tablet Place 0.4 mg under the tongue every 5 (five) minutes as needed for chest pain.  4  . ondansetron (ZOFRAN ODT) 4 MG disintegrating tablet Take 1 tablet (4 mg total) by mouth every 8 (eight) hours as needed for nausea or vomiting. 20 tablet 0  . Oxycodone HCl 10 MG TABS Take 10 mg by mouth 2 (two) times daily as needed (for pain).   0  . pantoprazole (PROTONIX) 40 MG tablet Take 1 tablet (40 mg total) by mouth 2 (two) times daily. 60 tablet 1  . potassium chloride SA (K-DUR,KLOR-CON) 20 MEQ tablet Take 20 mEq by mouth daily.    . sertraline (ZOLOFT) 50 MG tablet Take 50 mg by mouth daily.    . sitaGLIPtin (JANUVIA) 100 MG tablet Take 100 mg by mouth daily.    Marland Kitchen tiZANidine (ZANAFLEX) 2 MG tablet Take 2 mg by mouth every 6 (six) hours as needed for muscle spasms.    . traZODone (DESYREL) 50 MG tablet Take 1 tablet (50 mg total) by mouth at bedtime. 30 tablet 11   No current facility-administered medications for this visit.     PAST MEDICAL HISTORY: Past Medical History:  Diagnosis Date  . Arthritis   . Back pain   . CAD (coronary artery disease)    a. s/p DES to LAD in 05/2016  . DVT (deep venous thrombosis) ([place])   . Hyperlipidemia   . Hypertension   . IBS (irritable bowel syndrome)   . OSA (obstructive sleep apnea)   . Pancreatitis   . PE (pulmonary thromboembolism) ([place])   . PUD (peptic ulcer disease)   . Renal disorder   . Stroke ([place])   . Type 2 diabetes mellitus ([place])     PAST SURGICAL HISTORY: Past Surgical History:  Procedure Laterality Date  . CARDIAC CATHETERIZATION N/A 05/31/2016   Procedure: Left Heart Cath and Coronary Angiography;  Surgeon: Juan M Martinique, MD;  Location: [facility] CV LAB;  Service: Cardiovascular;  Laterality: N/A;  . CARDIAC CATHETERIZATION N/A 05/31/2016   Procedure: Intravascular Pressure Wire/FFR Study;   Surgeon: Juan M Martinique, MD;  Location: [facility] CV LAB;  Service: Cardiovascular;  Laterality: N/A;  . CARDIAC CATHETERIZATION N/A 05/31/2016   Procedure: Coronary Stent Intervention;  Surgeon: Juan M Martinique, MD;  Location: [facility] CV LAB;  Service: Cardiovascular;  Laterality: N/A;  . LEFT HEART CATHETERIZATION WITH CORONARY ANGIOGRAM N/A 02/03/2014   Procedure: LEFT HEART CATHETERIZATION WITH CORONARY ANGIOGRAM;  Surgeon: Pixie Casino, MD;  Location: [facility] CATH LAB;  Service: Cardiovascular;  Laterality: N/A;  . left leg stent       FAMILY HISTORY: Family History  Problem Relation Age of Onset  . Cancer Father   . Hypertension Mother   .  Diabetes Mother   . Breast cancer Mother   . Hypertension Brother   . Diabetes Brother   . Hypertension Sister   . Diabetes Sister     SOCIAL HISTORY:  Social History   Social History  . Marital status: Single    Spouse name: N/A  . Number of children: 3  . Years of education: 12   Occupational History  . Disabled    Social History Main Topics  . Smoking status: Never Smoker  . Smokeless tobacco: Never Used  . Alcohol use No  . Drug use: No  . Sexual activity: Not on file   Other Topics Concern  . Not on file   Social History Narrative   Independent and ambulatory with cane.   Lives at home alone.   Right-handed.   1-2 cups caffeine per day.     PHYSICAL EXAM   Vitals:   08/09/17 1618  BP: 131/73  Pulse: 82  Weight: (!) 316 lb (143.3 kg)  Height: '5\' 10"'  (1.778 m)    Not recorded      Body mass index is 45.34 kg/m.  PHYSICAL EXAMNIATION:  Gen: NAD, conversant, well nourised, obese, well groomed                     Cardiovascular: Regular rate rhythm, no peripheral edema, warm, nontender. Eyes: Conjunctivae clear without exudates or hemorrhage Neck: Supple, no carotid bruits. Pulmonary: Clear to auscultation bilaterally   NEUROLOGICAL EXAM:  MENTAL STATUS: Speech:    Speech is normal; fluent and  spontaneous with normal comprehension.  Cognition:     Orientation to time, place and person     Normal recent and remote memory     Normal Attention span and concentration     Normal Language, naming, repeating,spontaneous speech     Fund of knowledge   CRANIAL NERVES: CN II: Visual fields are full to confrontation. Fundoscopic exam is normal with sharp discs and no vascular changes. Pupils are round equal and briskly reactive to light. CN III, IV, VI: extraocular movement are normal. No ptosis. CN V: Facial sensation is intact to pinprick in all 3 divisions bilaterally. Corneal responses are intact.  CN VII: Face is symmetric with normal eye closure and smile. CN VIII: Hearing is normal to rubbing fingers CN IX, X: Palate elevates symmetrically. Phonation is normal. CN XI: Head turning and shoulder shrug are intact CN XII: Tongue is midline with normal movements and no atrophy.  MOTOR: There is no pronator drift of out-stretched arms. Muscle bulk and tone are normal. Muscle strength is normal.  REFLEXES: Reflexes are 2+ and symmetric at the biceps, triceps, knees, and ankles. Plantar responses are flexor.  SENSORY: Intact to light touch, pinprick, positional sensation and vibratory sensation are intact in fingers and toes.  COORDINATION: Rapid alternating movements and fine finger movements are intact. There is no dysmetria on finger-to-nose and heel-knee-shin.    GAIT/STANCE: He needs push up to get up from seated position, rely on his cane, obese, cautious, unsteady gait   DIAGNOSTIC DATA (LABS, IMAGING, TESTING) - I reviewed patient records, labs, notes, testing and imaging myself where available.   ASSESSMENT AND PLAN  RAYMAR JOINER is a 56 y.o. male   New-onset of headaches  C-reactive protein, ESR to rule out temporal arteritis  He has multiple vascular risk factors, hypertension, hyperlipidemia, obesity, diabetes  As needed NSAIDs for headaches,  Add on beta  Stein Inderal 80 mg every night as  headache prevention  Marcial Pacas, M.D. Ph.D.  [facility] [address], [place], [place] [address] Ph: [phone] Fax: [phone]   CC: Juan Blocker, MD

## 2017-08-10 ENCOUNTER — Encounter: Payer: Self-pay | Admitting: *Deleted

## 2017-08-10 ENCOUNTER — Telehealth: Payer: Self-pay | Admitting: Neurology

## 2017-08-10 DIAGNOSIS — E538 Deficiency of other specified B group vitamins: Secondary | ICD-10-CM | POA: Insufficient documentation

## 2017-08-10 LAB — RPR: RPR Ser Ql: NONREACTIVE

## 2017-08-10 LAB — TSH: TSH: 1.88 u[IU]/mL (ref 0.450–4.500)

## 2017-08-10 LAB — C-REACTIVE PROTEIN: CRP: 7.2 mg/L — ABNORMAL HIGH (ref 0.0–4.9)

## 2017-08-10 LAB — ANA W/REFLEX: Anti Nuclear Antibody(ANA): NEGATIVE

## 2017-08-10 LAB — VITAMIN B12: Vitamin B-12: <150 pg/mL — ABNORMAL LOW (ref 232–1245)

## 2017-08-10 LAB — SEDIMENTATION RATE: Sed Rate: 27 mm/hr (ref 0–30)

## 2017-08-10 NOTE — Telephone Encounter (Addendum)
Spoke to [name], case worker - she requested B12 schedule be written down and faxed to her so she can set this up with his home health nurse.  Completed, faxed and confirmed to [phone].  Injection schedule:  B12 1056mcg, IM, daily x one week. B12 1053mcg, IM, weekly x one month. B12 1070mcg, IM, monthly x one year.

## 2017-08-10 NOTE — Telephone Encounter (Signed)
Please call patient, laboratory evaluation showed evidence of vitamin B12 deficiency less than 150, he should take vitamin B12 supplements, 1000 g daily, then weekly, then monthly, this can be arranged at home, he has a case Freight forwarder, primary care's office, or our office,

## 2017-08-10 NOTE — Telephone Encounter (Signed)
Spoke to patient - he is aware of results.  He has home health nursing and would like injections added to his orders.  I have called his case worker, Craig Guess (on HIPAA) and left a message requesting a return call.  We will need her to add the B12 injections to his care plan.

## 2017-08-11 ENCOUNTER — Telehealth: Payer: Self-pay | Admitting: *Deleted

## 2017-08-11 IMAGING — DX DG CHEST 2V
2 series · 2 of 2 positions shown · non-contrast
Comparison: 07/22/2016

CLINICAL DATA: Central and left chest pain with dyspnea for 3 days.

EXAM:
CHEST  2 VIEW

[chest pa]
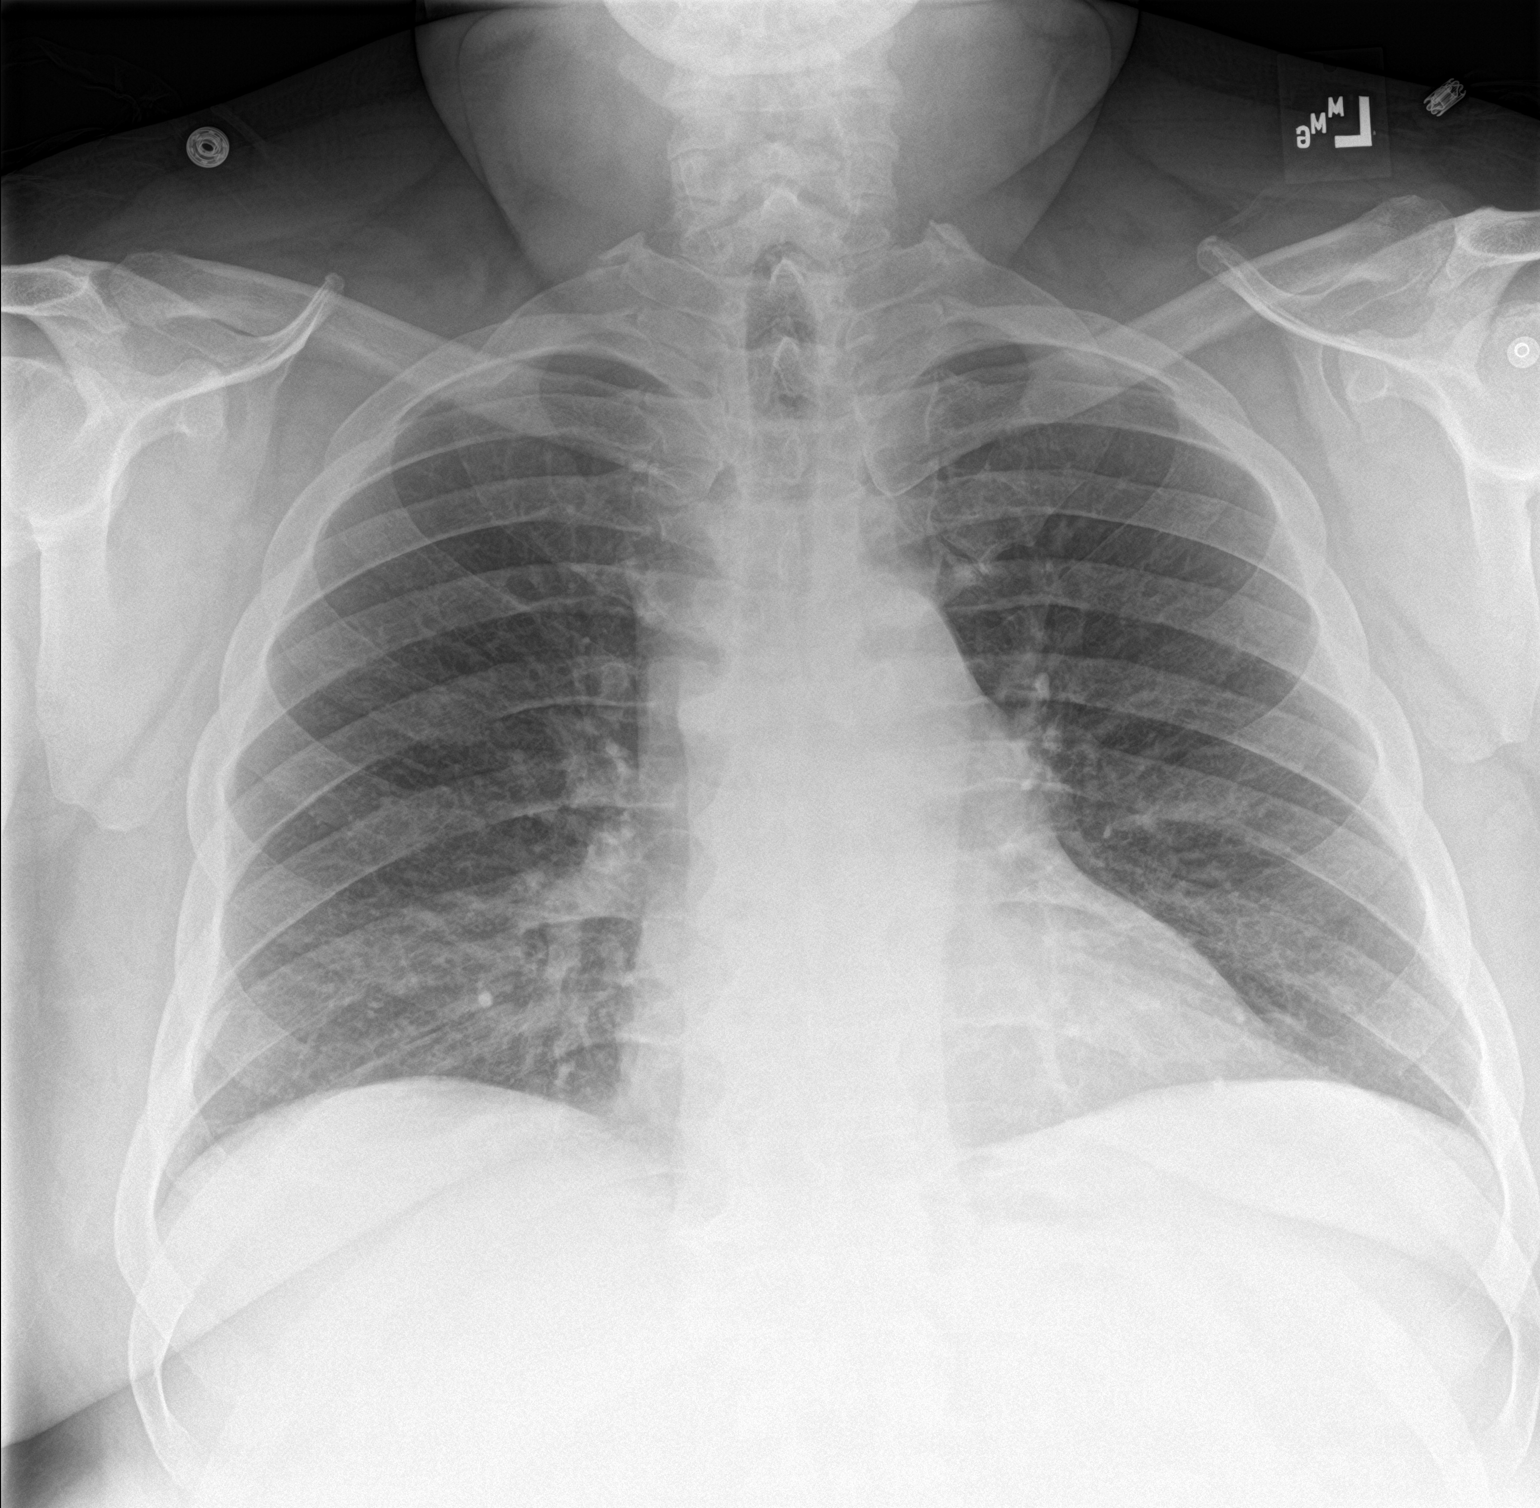

[chest lat]
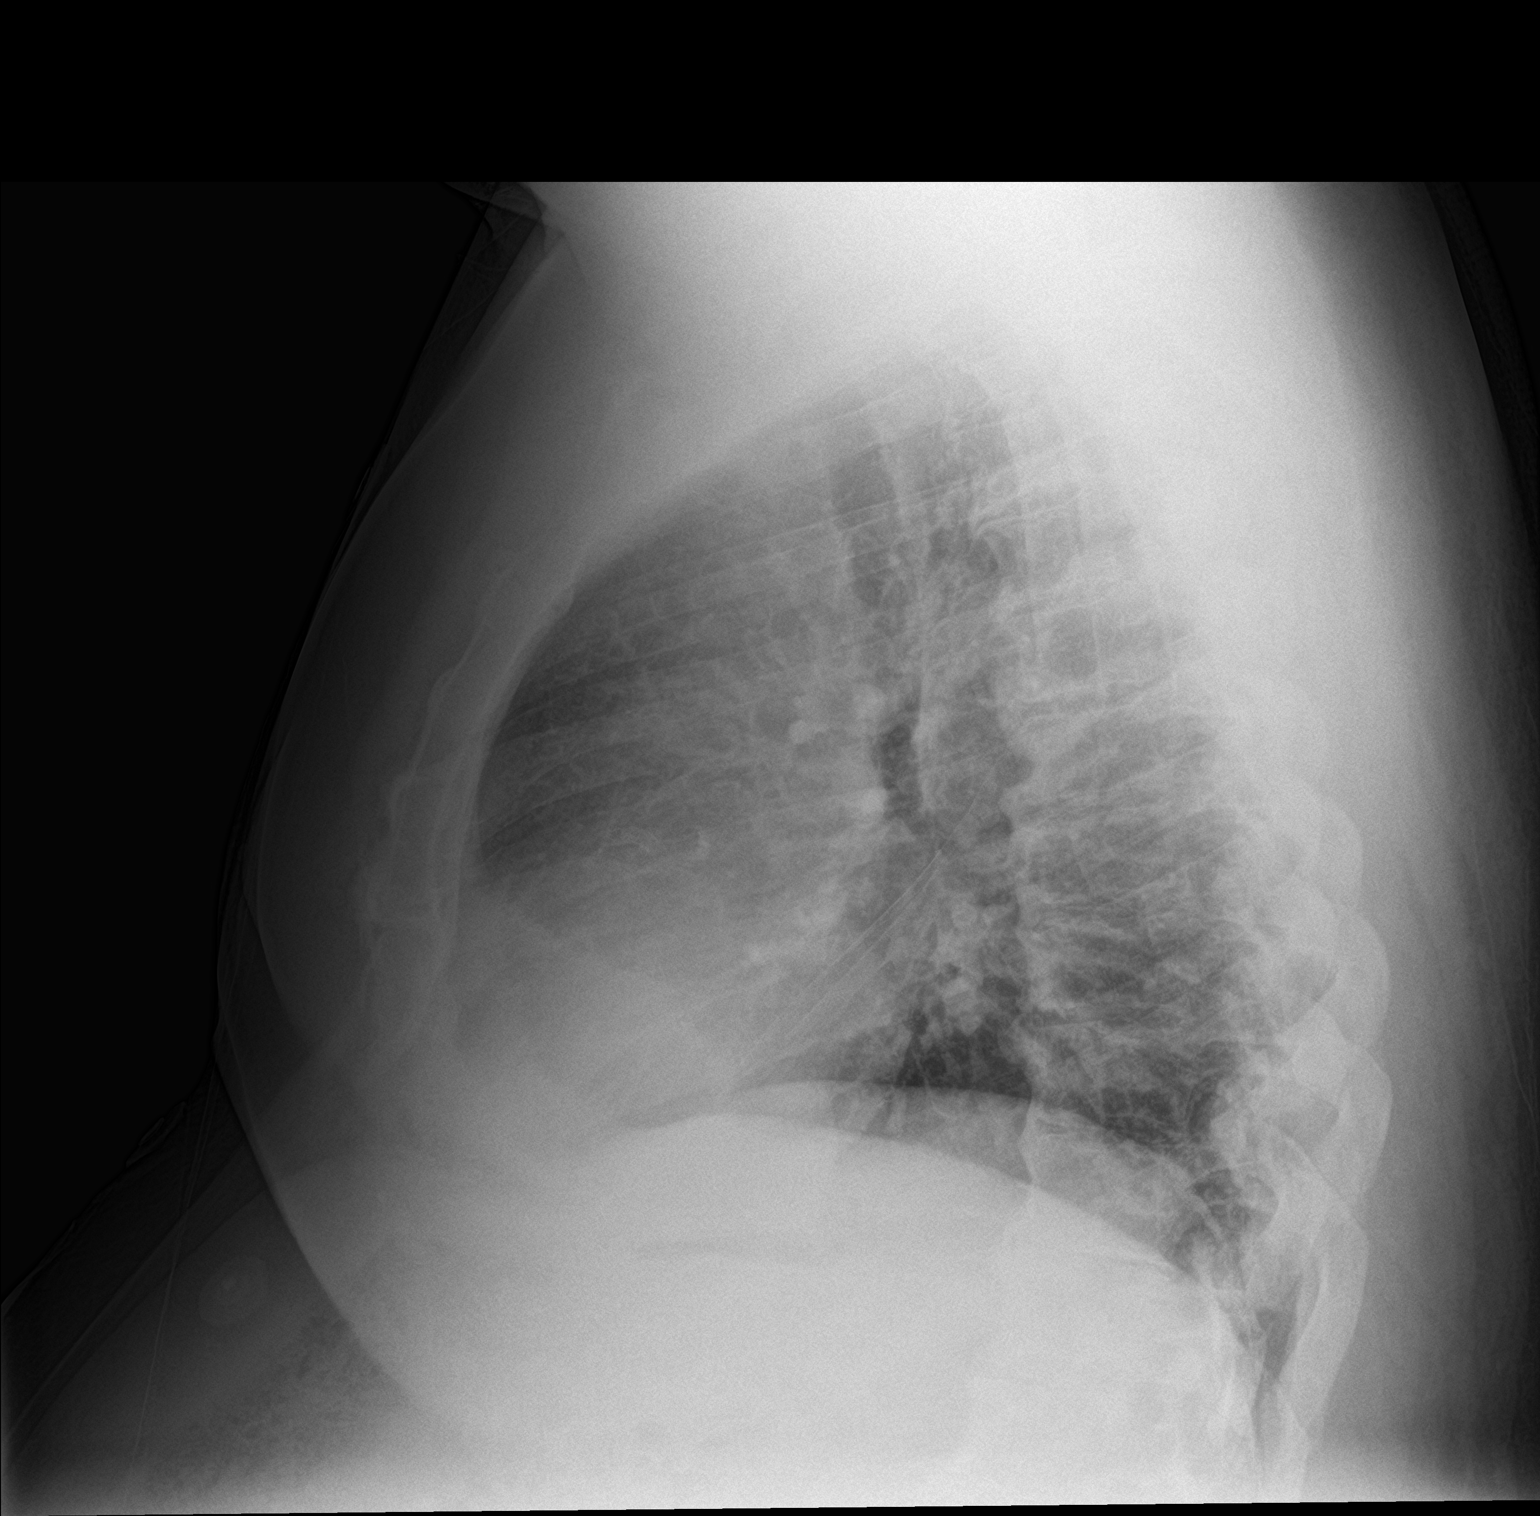

[2 of 2 positions shown; findings below may reference images not displayed]

FINDINGS: The heart size and mediastinal contours are within normal limits.
Both lungs are clear. The visualized skeletal structures are
unremarkable.
IMPRESSION: No active cardiopulmonary disease.

## 2017-08-11 NOTE — Telephone Encounter (Signed)
Labs received from PCP (collected 04/03/17):  CMP: Glucose - 134 (H) Creatinine - 0.62 (L) Potassium - 3.1 (L) Alkaline Phosphatase - 39 (L) (all other values wnl)  CBC: Hemoglobin - 12.5 (L) Hematocrit - 36.3% (L)  Vitamin D - 25 (L)  Hemoglobin A1C - 9.4% (H)

## 2017-08-14 NOTE — Telephone Encounter (Signed)
Pt states he has not heard from anyone re:his 12 shots and he is asking for a call please

## 2017-08-14 NOTE — Telephone Encounter (Signed)
Returned call to patient - he is aware that his case worker has the orders and is getting his injections set up through his home health nurse.

## 2017-08-21 ENCOUNTER — Other Ambulatory Visit: Payer: Self-pay | Admitting: *Deleted

## 2017-08-21 ENCOUNTER — Ambulatory Visit (INDEPENDENT_AMBULATORY_CARE_PROVIDER_SITE_OTHER): Payer: Medicaid Other | Admitting: *Deleted

## 2017-08-21 DIAGNOSIS — E538 Deficiency of other specified B group vitamins: Secondary | ICD-10-CM | POA: Diagnosis not present

## 2017-08-21 MED ORDER — CYANOCOBALAMIN 1000 MCG/ML IJ SOLN: 1000.0000 ug | INTRAMUSCULAR | Status: DC

## 2017-08-21 MED ORDER — CYANOCOBALAMIN 1000 MCG/ML IJ SOLN
1000.0000 ug | INTRAMUSCULAR | Status: AC
  Administered 2017-08-31 – 2017-09-20 (×4): 1000 ug via INTRAMUSCULAR

## 2017-08-21 MED ORDER — CYANOCOBALAMIN 1000 MCG/ML IJ SOLN
1000.0000 ug | Freq: Every day | INTRAMUSCULAR | Status: AC
  Administered 2017-08-21 – 2017-08-24 (×4): 1000 ug via INTRAMUSCULAR

## 2017-08-21 NOTE — Telephone Encounter (Signed)
Spoke to both Craig Guess and patient.  The PA doing home health care is unable to inject B12.  He will start injections today in our office.

## 2017-08-21 NOTE — Telephone Encounter (Signed)
His caseworker< Craig Guess on [place], was suppose to set his injections up through his home health nurse.  I have called and left her a message requesting a return call.

## 2017-08-21 NOTE — Telephone Encounter (Signed)
Pt called he has not rec'd a call from home health RN and is wanting to come to the clinic and get it daily. Pt said he lives close and would rather do that. Please call to discuss at [phone]

## 2017-08-22 ENCOUNTER — Ambulatory Visit (INDEPENDENT_AMBULATORY_CARE_PROVIDER_SITE_OTHER): Payer: Medicaid Other | Admitting: *Deleted

## 2017-08-22 DIAGNOSIS — E538 Deficiency of other specified B group vitamins: Secondary | ICD-10-CM | POA: Diagnosis not present

## 2017-08-23 ENCOUNTER — Ambulatory Visit (INDEPENDENT_AMBULATORY_CARE_PROVIDER_SITE_OTHER): Payer: Medicaid Other | Admitting: *Deleted

## 2017-08-23 DIAGNOSIS — E538 Deficiency of other specified B group vitamins: Secondary | ICD-10-CM | POA: Diagnosis not present

## 2017-08-24 ENCOUNTER — Ambulatory Visit (INDEPENDENT_AMBULATORY_CARE_PROVIDER_SITE_OTHER): Payer: Medicaid Other | Admitting: *Deleted

## 2017-08-24 DIAGNOSIS — E538 Deficiency of other specified B group vitamins: Secondary | ICD-10-CM | POA: Diagnosis not present

## 2017-08-31 ENCOUNTER — Ambulatory Visit (INDEPENDENT_AMBULATORY_CARE_PROVIDER_SITE_OTHER): Payer: Medicaid Other | Admitting: *Deleted

## 2017-08-31 DIAGNOSIS — E538 Deficiency of other specified B group vitamins: Secondary | ICD-10-CM

## 2017-08-31 NOTE — Progress Notes (Signed)
Pt here for B12 injection .  Under aseptic technique cyanocobalamin 1074mcg/1ml IM to L deltoid.    Tolerated well.  Bandaid applied.

## 2017-09-02 ENCOUNTER — Other Ambulatory Visit: Payer: Self-pay

## 2017-09-02 ENCOUNTER — Emergency Department (HOSPITAL_COMMUNITY)
Admission: EM | Admit: 2017-09-02 | Discharge: 2017-09-02 | Disposition: A | Discharge status: Home / Self Care | Payer: Medicaid Other | Attending: Emergency Medicine | Admitting: Emergency Medicine

## 2017-09-02 ENCOUNTER — Encounter (HOSPITAL_COMMUNITY): Payer: Self-pay | Admitting: Emergency Medicine

## 2017-09-02 DIAGNOSIS — R809 Proteinuria, unspecified: Secondary | ICD-10-CM

## 2017-09-02 DIAGNOSIS — M545 Low back pain, unspecified: Secondary | ICD-10-CM

## 2017-09-02 DIAGNOSIS — Z794 Long term (current) use of insulin: Secondary | ICD-10-CM | POA: Diagnosis not present

## 2017-09-02 DIAGNOSIS — Z7982 Long term (current) use of aspirin: Secondary | ICD-10-CM | POA: Diagnosis not present

## 2017-09-02 DIAGNOSIS — Z8673 Personal history of transient ischemic attack (TIA), and cerebral infarction without residual deficits: Secondary | ICD-10-CM | POA: Insufficient documentation

## 2017-09-02 DIAGNOSIS — R319 Hematuria, unspecified: Secondary | ICD-10-CM

## 2017-09-02 DIAGNOSIS — I2510 Atherosclerotic heart disease of native coronary artery without angina pectoris: Secondary | ICD-10-CM | POA: Diagnosis not present

## 2017-09-02 DIAGNOSIS — I10 Essential (primary) hypertension: Secondary | ICD-10-CM | POA: Insufficient documentation

## 2017-09-02 DIAGNOSIS — Z79899 Other long term (current) drug therapy: Secondary | ICD-10-CM | POA: Diagnosis not present

## 2017-09-02 DIAGNOSIS — F329 Major depressive disorder, single episode, unspecified: Secondary | ICD-10-CM | POA: Insufficient documentation

## 2017-09-02 DIAGNOSIS — E119 Type 2 diabetes mellitus without complications: Secondary | ICD-10-CM | POA: Diagnosis not present

## 2017-09-02 DIAGNOSIS — G8929 Other chronic pain: Secondary | ICD-10-CM

## 2017-09-02 DIAGNOSIS — Z7902 Long term (current) use of antithrombotics/antiplatelets: Secondary | ICD-10-CM | POA: Diagnosis not present

## 2017-09-02 LAB — URINALYSIS, ROUTINE W REFLEX MICROSCOPIC
Glucose, UA: 50 mg/dL — AB
Ketones, ur: NEGATIVE mg/dL
Leukocytes, UA: NEGATIVE
Nitrite: NEGATIVE
Protein, ur: >=300 mg/dL — AB
Specific Gravity, Urine: 1.014 (ref 1.005–1.030)
pH: 9 — ABNORMAL HIGH (ref 5.0–8.0)

## 2017-09-02 LAB — LIPASE, BLOOD: Lipase: 22 U/L (ref 11–51)

## 2017-09-02 LAB — COMPREHENSIVE METABOLIC PANEL
ALT: 33 U/L (ref 17–63)
AST: 35 U/L (ref 15–41)
Albumin: 3.8 g/dL (ref 3.5–5.0)
Alkaline Phosphatase: 38 U/L (ref 38–126)
Anion gap: 12 (ref 5–15)
BUN: 10 mg/dL (ref 6–20)
CO2: 26 mmol/L (ref 22–32)
Calcium: 8.7 mg/dL — ABNORMAL LOW (ref 8.9–10.3)
Chloride: 98 mmol/L — ABNORMAL LOW (ref 101–111)
Creatinine, Ser: 0.77 mg/dL (ref 0.61–1.24)
GFR calc Af Amer: >60 mL/min (ref >60)
GFR calc non Af Amer: >60 mL/min (ref >60)
Glucose, Bld: 256 mg/dL — ABNORMAL HIGH (ref 65–99)
Potassium: 3.1 mmol/L — ABNORMAL LOW (ref 3.5–5.1)
Sodium: 136 mmol/L (ref 135–145)
Total Bilirubin: 0.7 mg/dL (ref 0.3–1.2)
Total Protein: 6.7 g/dL (ref 6.5–8.1)

## 2017-09-02 LAB — CBC
HCT: 35.7 % — ABNORMAL LOW (ref 39.0–52.0)
Hemoglobin: 11.2 g/dL — ABNORMAL LOW (ref 13.0–17.0)
MCH: 27.9 pg (ref 26.0–34.0)
MCHC: 31.4 g/dL (ref 30.0–36.0)
MCV: 89 fL (ref 78.0–100.0)
Platelets: 220 10*3/uL (ref 150–400)
RBC: 4.01 MIL/uL — ABNORMAL LOW (ref 4.22–5.81)
RDW: 15 % (ref 11.5–15.5)
WBC: 7.1 10*3/uL (ref 4.0–10.5)

## 2017-09-02 MED ORDER — KETOROLAC TROMETHAMINE 60 MG/2ML IM SOLN
60.0000 mg | Freq: Once | INTRAMUSCULAR | Status: AC
  Administered 2017-09-02: 60 mg via INTRAMUSCULAR
  Filled 2017-09-02: qty 2

## 2017-09-02 MED ORDER — ONDANSETRON HCL 4 MG PO TABS: 4.0000 mg | ORAL_TABLET | Freq: Three times a day (TID) | ORAL | 0 refills | Status: DC | PRN

## 2017-09-02 MED ORDER — KETOROLAC TROMETHAMINE 30 MG/ML IJ SOLN: 30.0000 mg | Freq: Once | INTRAMUSCULAR | Status: DC

## 2017-09-02 NOTE — ED Provider Notes (Signed)
[facility] EMERGENCY DEPARTMENT Provider Note   CSN: [id number] Arrival date & time: 09/02/17  0957     History   Chief Complaint Chief Complaint  Patient presents with  . Back Pain  . Diarrhea    HPI [name] is a 56 y.o. male with a past medical history of morbid obesity, coronary artery disease, chronic back pain managed by spinal stenosis orthopod specialist, type 2 diabetes, and obstructive sleep apnea with intermittent compliance to CPAP.  Patient states that over the past 2 days he has had intermittent sharp bilateral lumbar spine pain.  He states that his pain is worse with certain movements.  He denies any numbness or tingling in the bilateral lower extremities, weakness, saddle anesthesia, loss of bowel or bladder continence.  The patient was unsure if it might be from his kidneys.  He denies hematuria, dysuria, or history of kidney stones.  Patient is on chronic opiate narcotics which had not been helping with his pain.  HPI  Past Medical History:  Diagnosis Date  . Arthritis   . Back pain   . CAD (coronary artery disease)    a. s/p DES to LAD in 05/2016  . DVT (deep venous thrombosis) ([place])   . Hyperlipidemia   . Hypertension   . IBS (irritable bowel syndrome)   . OSA (obstructive sleep apnea)   . Pancreatitis   . PE (pulmonary thromboembolism) ([place])   . PUD (peptic ulcer disease)   . Renal disorder   . Stroke ([place])   . Type 2 diabetes mellitus [facility])     Patient Active Problem List   Diagnosis Date Noted  . B12 deficiency 08/10/2017  . Persistent headaches 08/09/2017  . GERD (gastroesophageal reflux disease) 06/29/2017  . Left arm numbness   . Cerebral embolism with cerebral infarction 06/17/2017  . TIA (transient ischemic attack) 06/17/2017  . Chronic back pain 12/29/2016  . Depression 12/15/2016  . Vitamin D deficiency 12/09/2016  . Right hip pain 12/07/2016  . Hypokalemia 12/07/2016  . Type 2 diabetes mellitus with  vascular disease ([place]) 05/31/2016  . Normocytic normochromic anemia 05/31/2016  . Chest pain 05/31/2016  . CAD (coronary artery disease) 01/06/2016  . DVT (deep venous thrombosis) ([place]) 01/06/2016  . Lactic acidosis 05/28/2014  . Nonspecific chest pain 01/29/2014  . Uncontrolled secondary diabetes with peripheral neuropathy ([place]) 01/29/2014  . Obesity, Class III, BMI 40-49.9 (morbid obesity) ([place]) 01/29/2014  . Snoring 01/29/2014  . Dyslipidemia 01/29/2014  . HTN (hypertension) 01/29/2014  . Abnormal nuclear stress test 01/29/2014    Past Surgical History:  Procedure Laterality Date  . left leg stent          Home Medications    Prior to Admission medications   Medication Sig Start Date End Date Taking? Authorizing Provider  ACCU-CHEK SOFTCLIX LANCETS lancets 1 each as needed. 02/03/17  Yes [provider]  aspirin 81 MG chewable tablet Chew 1 tablet (81 mg total) by mouth daily. 06/01/16  Yes Elwin Mocha, MD  atorvastatin (LIPITOR) 80 MG tablet Take 1 tablet (80 mg total) by mouth daily. 01/26/17  Yes Strader, Fransisco Hertz, PA-C  carvedilol (COREG) 25 MG tablet Take 1 tablet (25 mg total) by mouth 2 (two) times daily with a meal. 01/26/17  Yes Strader, [place] M, PA-C  clopidogrel (PLAVIX) 75 MG tablet Take 75 mg by mouth daily.   Yes [provider]  famotidine (PEPCID) 40 MG tablet Take 1 tablet (40 mg total) by  mouth at bedtime. Patient taking differently: Take 40 mg by mouth daily.  06/18/17  Yes Barton Dubois, MD  furosemide (LASIX) 40 MG tablet Take 1 tablet (40 mg total) by mouth 2 (two) times daily. 01/26/17  Yes Strader, [place] M, PA-C  glipiZIDE (GLUCOTROL) 10 MG tablet Take 10 mg by mouth 2 (two) times daily. 05/30/17  Yes [provider]  hydrALAZINE (APRESOLINE) 50 MG tablet Take 1 tablet (50 mg total) by mouth 3 (three) times daily. 07/11/17  Yes Barrett, Evelene Croon, PA-C  Insulin Detemir (LEVEMIR) 100 UNIT/ML Pen Inject 20 Units into the skin  daily at 10 pm. Patient taking differently: Inject 50 Units daily at 10 pm into the skin.  06/18/17  Yes Barton Dubois, MD  Insulin Pen Needle 31G X 5 MM MISC Use 1 needle daily to inject insulin as prescribed 06/18/17  Yes Barton Dubois, MD  isosorbide dinitrate (ISORDIL) 30 MG tablet Take 1 tablet (30 mg total) by mouth 2 (two) times daily. Patient taking differently: Take 30 mg by mouth every morning.  11/22/16  Yes Strader, [place] M, PA-C  ketoconazole (NIZORAL) 2 % cream Apply 1 application daily as needed topically for irritation.  07/04/17  Yes [provider]  LYRICA 75 MG capsule Take 75 mg by mouth 2 (two) times daily. 04/03/17  Yes [provider]  metFORMIN (GLUCOPHAGE-XR) 500 MG 24 hr tablet Take 2 tablets (1,000 mg total) by mouth 2 (two) times daily. 07/02/17  Yes Barton Dubois, MD  nitroGLYCERIN (NITROSTAT) 0.4 MG SL tablet Place 0.4 mg under the tongue every 5 (five) minutes as needed for chest pain. 12/17/16  Yes [provider]  ondansetron (ZOFRAN ODT) 4 MG disintegrating tablet Take 1 tablet (4 mg total) by mouth every 8 (eight) hours as needed for nausea or vomiting. 06/09/17  Yes Nanavati, Ankit, MD  oxyCODONE (ROXICODONE) 15 MG immediate release tablet Take 15 mg 3 (three) times daily as needed by mouth for pain.  08/28/17  Yes [provider]  pantoprazole (PROTONIX) 40 MG tablet Take 1 tablet (40 mg total) by mouth 2 (two) times daily. 06/18/17  Yes Barton Dubois, MD  potassium chloride SA (K-DUR,KLOR-CON) 20 MEQ tablet Take 20 mEq by mouth daily.   Yes [provider]  propranolol ER (INDERAL LA) 80 MG 24 hr capsule Take 1 capsule (80 mg total) by mouth at bedtime. 08/09/17  Yes Marcial Pacas, MD  sertraline (ZOLOFT) 50 MG tablet Take 50 mg by mouth daily.   Yes [provider]  sitaGLIPtin (JANUVIA) 100 MG tablet Take 100 mg by mouth daily.   Yes [provider]  terbinafine (LAMISIL) 250 MG tablet Take 250 mg daily by  mouth. 08/09/17  Yes [provider]  tiZANidine (ZANAFLEX) 2 MG tablet Take 2 mg by mouth every 6 (six) hours as needed for muscle spasms.   Yes [provider]  traZODone (DESYREL) 50 MG tablet Take 1 tablet (50 mg total) by mouth at bedtime. 12/15/16  Yes Burns, Arloa Koh, MD  Oxycodone HCl 10 MG TABS Take 10 mg by mouth 2 (two) times daily as needed (for pain).  04/18/17   [provider]    Family History Family History  Problem Relation Age of Onset  . Cancer Father   . Hypertension Mother   . Diabetes Mother   . Breast cancer Mother   . Hypertension Brother   . Diabetes Brother   . Hypertension Sister   . Diabetes Sister  Social History Social History   Tobacco Use  . Smoking status: Never Smoker  . Smokeless tobacco: Never Used  Substance Use Topics  . Alcohol use: No  . Drug use: No     Allergies   Coconut flavor [flavoring agent]; Coconut oil; Ibuprofen; and Aleve [naproxen]   Review of Systems Review of Systems  Ten systems reviewed and are negative for acute change, except as noted in the HPI.   Physical Exam Updated Vital Signs BP (!) 152/88   Pulse 74   Temp 97.9 F (36.6 C) (Oral)   Resp (!) 22   Ht 5\' 10"  (1.778 m)   Wt (!) 140.6 kg (310 lb)   SpO2 97%   BMI 44.48 kg/m   Physical Exam  Constitutional: He is oriented to person, place, and time. He appears well-developed and well-nourished. No distress.  HENT:  Head: Normocephalic and atraumatic.  Eyes: Conjunctivae and EOM are normal. Pupils are equal, round, and reactive to light. No scleral icterus.  Neck: Normal range of motion. Neck supple.  Cardiovascular: Normal rate, regular rhythm and normal heart sounds.  Pulmonary/Chest: Effort normal and breath sounds normal. No respiratory distress.  Abdominal: Soft. He exhibits no distension. There is no tenderness.  No CVA tenderness  Musculoskeletal: He exhibits no edema.       Right shoulder: He exhibits decreased  range of motion and spasm. He exhibits no bony tenderness.       Arms: Pain is worse with forward/ lateral flexion esp to the R. Palpable Lumbar paraspinal spasm    Neurological: He is alert and oriented to person, place, and time. He displays normal reflexes. No sensory deficit.  Reflex Scores:      Patellar reflexes are 2+ on the right side and 2+ on the left side.      Achilles reflexes are 2+ on the right side and 2+ on the left side. Skin: Skin is warm and dry. He is not diaphoretic.  Psychiatric: His behavior is normal.  Nursing note and vitals reviewed.    ED Treatments / Results  Labs (all labs ordered are listed, but only abnormal results are displayed) Labs Reviewed  COMPREHENSIVE METABOLIC PANEL - Abnormal; Notable for the following components:      Result Value   Potassium 3.1 (*)    Chloride 98 (*)    Glucose, Bld 256 (*)    Calcium 8.7 (*)    All other components within normal limits  CBC - Abnormal; Notable for the following components:   RBC 4.01 (*)    Hemoglobin 11.2 (*)    HCT 35.7 (*)    All other components within normal limits  URINALYSIS, ROUTINE W REFLEX MICROSCOPIC - Abnormal; Notable for the following components:   Color, Urine AMBER (*)    pH 9.0 (*)    Glucose, UA 50 (*)    Hgb urine dipstick SMALL (*)    Bilirubin Urine SMALL (*)    Protein, ur >=300 (*)    All other components within normal limits  LIPASE, BLOOD    EKG  EKG Interpretation None       Radiology No results found.  Procedures Procedures (including critical care time)  Medications Ordered in ED Medications - No data to display   Initial Impression / Assessment and Plan / ED Course  I have reviewed the triage vital signs and the nursing notes.  Pertinent labs & imaging results that were available during my care of the patient were reviewed  by me and considered in my medical decision making (see chart for details).  Clinical Course as of Sep 03 1627  Sat Sep 02, 2017  1626 Patient pain well controlled here in the emergency department.  No CVA tenderness, patient presentation does not appear to be consistent with renal colic.  I believe this is an exacerbation of his existing back pain.  He is taken Toradol in the past with good relief of his symptoms.  Also has low potassium.  The patient has refused to take any at this visit and has known hypokalemia and will take his regular prescription at home.  [AH]    Clinical Course User Index [AH] [name], PA-C   Patient advised to have a recheck with his primary care physician regarding his hematuria and proteinuria. Patient with back pain.  No neurological deficits and normal neuro exam.  Patient can walk but states is painful.  No loss of bowel or bladder control.  No concern for cauda equina.  No fever, night sweats, weight loss, h/o cancer, IVDU.  RICE protocol and pain medicine indicated and discussed with patient.    Final Clinical Impressions(s) / ED Diagnoses   Final diagnoses:  Chronic bilateral low back pain without sciatica  Hematuria, unspecified type  Proteinuria, unspecified type    ED Discharge Orders    None       [name], PA-C 09/02/17 1628    Gareth Morgan, MD 09/03/17 2147

## 2017-09-02 NOTE — ED Notes (Signed)
Got patient hooked up to the monitor patient is resting with call bell in reach 

## 2017-09-02 NOTE — Discharge Instructions (Signed)
SEEK IMMEDIATE MEDICAL ATTENTION IF: New numbness, tingling, weakness, or problem with the use of your arms or legs.  Severe back pain not relieved with medications.  Change in bowel or bladder control.  Increasing pain in any areas of the body (such as chest or abdominal pain).  Shortness of breath, dizziness or fainting.  Nausea (feeling sick to your stomach), vomiting, fever, or sweats.  

## 2017-09-02 NOTE — ED Triage Notes (Signed)
Pt c/o diarrhea for "a few days" now is watery-- has hx of IBS- and diabetes, pt states has "shooting low back pains" across lumbar area-- "that takes my breath away"

## 2017-09-04 ENCOUNTER — Encounter (HOSPITAL_COMMUNITY): Payer: Self-pay | Admitting: Emergency Medicine

## 2017-09-04 ENCOUNTER — Emergency Department (HOSPITAL_COMMUNITY): Payer: Medicaid Other

## 2017-09-04 ENCOUNTER — Other Ambulatory Visit: Payer: Self-pay

## 2017-09-04 ENCOUNTER — Emergency Department (HOSPITAL_COMMUNITY)
Admission: EM | Admit: 2017-09-04 | Discharge: 2017-09-04 | Disposition: A | Discharge status: Home / Self Care | Payer: Medicaid Other | Attending: Emergency Medicine | Admitting: Emergency Medicine

## 2017-09-04 DIAGNOSIS — Z8673 Personal history of transient ischemic attack (TIA), and cerebral infarction without residual deficits: Secondary | ICD-10-CM | POA: Diagnosis not present

## 2017-09-04 DIAGNOSIS — Z79899 Other long term (current) drug therapy: Secondary | ICD-10-CM | POA: Insufficient documentation

## 2017-09-04 DIAGNOSIS — Z794 Long term (current) use of insulin: Secondary | ICD-10-CM | POA: Diagnosis not present

## 2017-09-04 DIAGNOSIS — I2510 Atherosclerotic heart disease of native coronary artery without angina pectoris: Secondary | ICD-10-CM | POA: Diagnosis not present

## 2017-09-04 DIAGNOSIS — E119 Type 2 diabetes mellitus without complications: Secondary | ICD-10-CM | POA: Diagnosis not present

## 2017-09-04 DIAGNOSIS — Z7982 Long term (current) use of aspirin: Secondary | ICD-10-CM | POA: Insufficient documentation

## 2017-09-04 DIAGNOSIS — F329 Major depressive disorder, single episode, unspecified: Secondary | ICD-10-CM | POA: Diagnosis not present

## 2017-09-04 DIAGNOSIS — I10 Essential (primary) hypertension: Secondary | ICD-10-CM | POA: Insufficient documentation

## 2017-09-04 DIAGNOSIS — Z7902 Long term (current) use of antithrombotics/antiplatelets: Secondary | ICD-10-CM | POA: Diagnosis not present

## 2017-09-04 LAB — CBC
HCT: 35.3 % — ABNORMAL LOW (ref 39.0–52.0)
Hemoglobin: 11.3 g/dL — ABNORMAL LOW (ref 13.0–17.0)
MCH: 28.3 pg (ref 26.0–34.0)
MCHC: 32 g/dL (ref 30.0–36.0)
MCV: 88.5 fL (ref 78.0–100.0)
Platelets: 211 10*3/uL (ref 150–400)
RBC: 3.99 MIL/uL — ABNORMAL LOW (ref 4.22–5.81)
RDW: 14.9 % (ref 11.5–15.5)
WBC: 7.8 10*3/uL (ref 4.0–10.5)

## 2017-09-04 LAB — BASIC METABOLIC PANEL
Anion gap: 12 (ref 5–15)
BUN: 10 mg/dL (ref 6–20)
CO2: 27 mmol/L (ref 22–32)
Calcium: 9 mg/dL (ref 8.9–10.3)
Chloride: 100 mmol/L — ABNORMAL LOW (ref 101–111)
Creatinine, Ser: 0.72 mg/dL (ref 0.61–1.24)
GFR calc Af Amer: >60 mL/min (ref >60)
GFR calc non Af Amer: >60 mL/min (ref >60)
Glucose, Bld: 218 mg/dL — ABNORMAL HIGH (ref 65–99)
Potassium: 3.1 mmol/L — ABNORMAL LOW (ref 3.5–5.1)
Sodium: 139 mmol/L (ref 135–145)

## 2017-09-04 LAB — I-STAT TROPONIN, ED: Troponin i, poc: 0 ng/mL (ref 0.00–0.08)

## 2017-09-04 MED ORDER — MAGNESIUM OXIDE 400 (241.3 MG) MG PO TABS
800.0000 mg | ORAL_TABLET | Freq: Once | ORAL | Status: AC
  Administered 2017-09-04: 800 mg via ORAL
  Filled 2017-09-04: qty 2

## 2017-09-04 MED ORDER — POTASSIUM CHLORIDE CRYS ER 20 MEQ PO TBCR
40.0000 meq | EXTENDED_RELEASE_TABLET | Freq: Once | ORAL | Status: AC
  Administered 2017-09-04: 40 meq via ORAL
  Filled 2017-09-04: qty 2

## 2017-09-04 NOTE — ED Triage Notes (Signed)
Pt to ER for evaluation of shortness of breath and diaphoresis onset 2 days ago, states was here two days ago for "issues with my kidneys." pt in NAD, dyspneic on exertion. BP 183/100.

## 2017-09-04 NOTE — Discharge Instructions (Signed)
Call your family doctor today and discuss your blood pressure regimen.

## 2017-09-04 NOTE — ED Provider Notes (Signed)
[facility] EMERGENCY DEPARTMENT Provider Note   CSN: [id number] Arrival date & time: 09/04/17  1007     History   Chief Complaint Chief Complaint  Patient presents with  . Shortness of Breath    HPI [name] is a 56 y.o. male.  56 yo M with a chief complaint of hypertension.  The patient checked his blood pressure at home and it was noted to be elevated.  He has a monitoring system at home and he was called about his reading.  At that point he was on his way to the pharmacy to get a prescription refilled.  They suggested he get his blood pressure rechecked.  It remained high there.  They then told him to come immediately to the emergency department.  The patient has had some shortness of breath on exertion.  He estimates this been going on for at least the past 2 years.  He denies any recent change.  He has had some lower extremity edema as well which has not had any significant change.  He denies chest pain or pressure.  Patient also has had headaches and blurry vision.  This also has been going on for at least 2 years.  He has had no change to this.   The history is provided by the patient.  Shortness of Breath  This is a chronic problem. The average episode lasts 2 years. The problem occurs continuously.The current episode started more than 1 week ago. The problem has not changed since onset.Associated symptoms include headaches. Pertinent negatives include no fever, no chest pain, no vomiting, no abdominal pain and no rash. He has tried nothing for the symptoms. The treatment provided no relief. He has had prior hospitalizations. He has had prior ED visits. Associated medical issues include heart failure and past MI.    Past Medical History:  Diagnosis Date  . Arthritis   . Back pain   . CAD (coronary artery disease)    a. s/p DES to LAD in 05/2016  . DVT (deep venous thrombosis) ([place])   . Hyperlipidemia   . Hypertension   . IBS (irritable bowel  syndrome)   . OSA (obstructive sleep apnea)   . Pancreatitis   . PE (pulmonary thromboembolism) ([place])   . PUD (peptic ulcer disease)   . Renal disorder   . Stroke ([place])   . Type 2 diabetes mellitus [facility])     Patient Active Problem List   Diagnosis Date Noted  . B12 deficiency 08/10/2017  . Persistent headaches 08/09/2017  . GERD (gastroesophageal reflux disease) 06/29/2017  . Left arm numbness   . Cerebral embolism with cerebral infarction 06/17/2017  . TIA (transient ischemic attack) 06/17/2017  . Chronic back pain 12/29/2016  . Depression 12/15/2016  . Vitamin D deficiency 12/09/2016  . Right hip pain 12/07/2016  . Hypokalemia 12/07/2016  . Type 2 diabetes mellitus with vascular disease ([place]) 05/31/2016  . Normocytic normochromic anemia 05/31/2016  . Chest pain 05/31/2016  . CAD (coronary artery disease) 01/06/2016  . DVT (deep venous thrombosis) ([place]) 01/06/2016  . Lactic acidosis 05/28/2014  . Nonspecific chest pain 01/29/2014  . Uncontrolled secondary diabetes with peripheral neuropathy ([place]) 01/29/2014  . Obesity, Class III, BMI 40-49.9 (morbid obesity) ([place]) 01/29/2014  . Snoring 01/29/2014  . Dyslipidemia 01/29/2014  . HTN (hypertension) 01/29/2014  . Abnormal nuclear stress test 01/29/2014    Past Surgical History:  Procedure Laterality Date  . left leg stent  Home Medications    Prior to Admission medications   Medication Sig Start Date End Date Taking? Authorizing Provider  ACCU-CHEK SOFTCLIX LANCETS lancets 1 each as needed. 02/03/17   [provider]  aspirin 81 MG chewable tablet Chew 1 tablet (81 mg total) by mouth daily. 06/01/16   Elwin Mocha, MD  atorvastatin (LIPITOR) 80 MG tablet Take 1 tablet (80 mg total) by mouth daily. 01/26/17   Ahmed Prima, Fransisco Hertz, PA-C  carvedilol (COREG) 25 MG tablet Take 1 tablet (25 mg total) by mouth 2 (two) times daily with a meal. 01/26/17   Strader, [place] M, PA-C  clopidogrel (PLAVIX) 75 MG  tablet Take 75 mg by mouth daily.    [provider]  famotidine (PEPCID) 40 MG tablet Take 1 tablet (40 mg total) by mouth at bedtime. Patient taking differently: Take 40 mg by mouth daily.  06/18/17   Barton Dubois, MD  furosemide (LASIX) 40 MG tablet Take 1 tablet (40 mg total) by mouth 2 (two) times daily. 01/26/17   Strader, Fransisco Hertz, PA-C  glipiZIDE (GLUCOTROL) 10 MG tablet Take 10 mg by mouth 2 (two) times daily. 05/30/17   [provider]  hydrALAZINE (APRESOLINE) 50 MG tablet Take 1 tablet (50 mg total) by mouth 3 (three) times daily. 07/11/17   Barrett, Evelene Croon, PA-C  Insulin Detemir (LEVEMIR) 100 UNIT/ML Pen Inject 20 Units into the skin daily at 10 pm. Patient taking differently: Inject 50 Units daily at 10 pm into the skin.  06/18/17   Barton Dubois, MD  Insulin Pen Needle 31G X 5 MM MISC Use 1 needle daily to inject insulin as prescribed 06/18/17   Barton Dubois, MD  isosorbide dinitrate (ISORDIL) 30 MG tablet Take 1 tablet (30 mg total) by mouth 2 (two) times daily. Patient taking differently: Take 30 mg by mouth every morning.  11/22/16   Strader, Fransisco Hertz, PA-C  ketoconazole (NIZORAL) 2 % cream Apply 1 application daily as needed topically for irritation.  07/04/17   [provider]  LYRICA 75 MG capsule Take 75 mg by mouth 2 (two) times daily. 04/03/17   [provider]  metFORMIN (GLUCOPHAGE-XR) 500 MG 24 hr tablet Take 2 tablets (1,000 mg total) by mouth 2 (two) times daily. 07/02/17   Barton Dubois, MD  nitroGLYCERIN (NITROSTAT) 0.4 MG SL tablet Place 0.4 mg under the tongue every 5 (five) minutes as needed for chest pain. 12/17/16   [provider]  ondansetron (ZOFRAN ODT) 4 MG disintegrating tablet Take 1 tablet (4 mg total) by mouth every 8 (eight) hours as needed for nausea or vomiting. 06/09/17   Varney Biles, MD  ondansetron (ZOFRAN) 4 MG tablet Take 1 tablet (4 mg total) every 8 (eight) hours as needed by mouth for nausea or  vomiting. 09/02/17   Harris, Vernie Shanks, PA-C  oxyCODONE (ROXICODONE) 15 MG immediate release tablet Take 15 mg 3 (three) times daily as needed by mouth for pain.  08/28/17   [provider]  Oxycodone HCl 10 MG TABS Take 10 mg by mouth 2 (two) times daily as needed (for pain).  04/18/17   [provider]  pantoprazole (PROTONIX) 40 MG tablet Take 1 tablet (40 mg total) by mouth 2 (two) times daily. 06/18/17   Barton Dubois, MD  potassium chloride SA (K-DUR,KLOR-CON) 20 MEQ tablet Take 20 mEq by mouth daily.    [provider]  propranolol ER (INDERAL LA) 80 MG 24 hr capsule Take 1 capsule (80 mg total)  by mouth at bedtime. 08/09/17   Marcial Pacas, MD  sertraline (ZOLOFT) 50 MG tablet Take 50 mg by mouth daily.    [provider]  sitaGLIPtin (JANUVIA) 100 MG tablet Take 100 mg by mouth daily.    [provider]  terbinafine (LAMISIL) 250 MG tablet Take 250 mg daily by mouth. 08/09/17   [provider]  tiZANidine (ZANAFLEX) 2 MG tablet Take 2 mg by mouth every 6 (six) hours as needed for muscle spasms.    [provider]  traZODone (DESYREL) 50 MG tablet Take 1 tablet (50 mg total) by mouth at bedtime. 12/15/16   Florinda Marker, MD    Family History Family History  Problem Relation Age of Onset  . Cancer Father   . Hypertension Mother   . Diabetes Mother   . Breast cancer Mother   . Hypertension Brother   . Diabetes Brother   . Hypertension Sister   . Diabetes Sister     Social History Social History   Tobacco Use  . Smoking status: Never Smoker  . Smokeless tobacco: Never Used  Substance Use Topics  . Alcohol use: No  . Drug use: No     Allergies   Coconut flavor [flavoring agent]; Coconut oil; Ibuprofen; and Aleve [naproxen]   Review of Systems Review of Systems  Constitutional: Negative for chills and fever.  HENT: Negative for congestion and facial swelling.   Eyes: Positive for visual disturbance. Negative for  discharge.  Respiratory: Positive for shortness of breath.   Cardiovascular: Negative for chest pain and palpitations.  Gastrointestinal: Negative for abdominal pain, diarrhea and vomiting.  Musculoskeletal: Negative for arthralgias and myalgias.  Skin: Negative for color change and rash.  Neurological: Positive for headaches. Negative for tremors and syncope.  Psychiatric/Behavioral: Negative for confusion and dysphoric mood.     Physical Exam Updated Vital Signs BP (!) 183/100 (BP Location: Right Arm)   Pulse 70   Temp 98.1 F (36.7 C) (Oral)   Resp 20   Physical Exam  Constitutional: He is oriented to person, place, and time. He appears well-developed and well-nourished.  HENT:  Head: Normocephalic and atraumatic.  Eyes: EOM are normal. Pupils are equal, round, and reactive to light.  Neck: Normal range of motion. Neck supple. No JVD present.  Cardiovascular: Normal rate and regular rhythm. Exam reveals no gallop and no friction rub.  No murmur heard. Pulmonary/Chest: No respiratory distress. He has no wheezes.  Abdominal: He exhibits no distension. There is no rebound and no guarding.  Musculoskeletal: Normal range of motion.       Right lower leg: He exhibits edema (3+\).       Left lower leg: He exhibits edema (3+).  Neurological: He is alert and oriented to person, place, and time. He has normal strength. No cranial nerve deficit or sensory deficit. He displays a negative Romberg sign. Coordination and gait normal. GCS eye subscore is 4. GCS verbal subscore is 5. GCS motor subscore is 6.  Benign neuro exam  Skin: No rash noted. No pallor.  Psychiatric: He has a normal mood and affect. His behavior is normal.  Nursing note and vitals reviewed.    ED Treatments / Results  Labs (all labs ordered are listed, but only abnormal results are displayed) Labs Reviewed  BASIC METABOLIC PANEL - Abnormal; Notable for the following components:      Result Value   Potassium 3.1  (*)    Chloride 100 (*)  Glucose, Bld 218 (*)    All other components within normal limits  CBC - Abnormal; Notable for the following components:   RBC 3.99 (*)    Hemoglobin 11.3 (*)    HCT 35.3 (*)    All other components within normal limits  I-STAT TROPONIN, ED    EKG  EKG Interpretation None       Radiology Dg Chest 2 View  Result Date: 09/04/2017 CLINICAL DATA:  Revision and headache this morning, found to be hypertensive. The patient reports some shortness of breath and chest tightness which has abated. EXAM: CHEST  2 VIEW COMPARISON:  CT scan of the chest of July 01, 2015 and chest x-ray of June 30, 2015 FINDINGS: The lungs are well-expanded. The interstitial markings are mildly prominent especially in the lower lung zones. The cardiac silhouette is enlarged. The central pulmonary vascularity is mildly prominent. The mediastinum is normal in width. The trachea is midline. There is calcification in the wall of the aortic arch. IMPRESSION: Cardiomegaly with mild central pulmonary vascular congestion and mild interstitial edema. No alveolar edema, pneumonia, or pleural effusion. Thoracic aortic atherosclerosis. Electronically Signed   By: David  Martinique M.D.   On: 09/04/2017 11:17    Procedures Procedures (including critical care time)  Medications Ordered in ED Medications  potassium chloride SA (K-DUR,KLOR-CON) CR tablet 40 mEq (not administered)  magnesium oxide (MAG-OX) tablet 800 mg (not administered)     Initial Impression / Assessment and Plan / ED Course  I have reviewed the triage vital signs and the nursing notes.  Pertinent labs & imaging results that were available during my care of the patient were reviewed by me and considered in my medical decision making (see chart for details).     56  Yo M with a chief complaint of hypertension.  He has a benign neuro exam.  Blood pressure is improved significantly with an appropriate sized cuff.  As he is  having no change of his chronic symptoms I feel he is safe for discharge.  We will have him follow-up with his PCP.  Patient asymptomatic with no noted s/s of end organ damage.  No chest pain, diaphoresis, nausea different than his baseline.  No headache or neurologic complaints changed from baselien, no unequal pulses, normal pulse ox without rales.  Feel this is unlikely to be a Hypertensive Emergency and recent studies suggest no benefit for inpatient admission.  There are also no studies to my knowledge suggesting that patients with hypertensive urgency have increased risk for end organ disease.The patient will follow up closely with their PCP.  Compliance with their medication stressed.    Lowell Guitar, Cicero Duck EH, et al. Characteristics and outcomes of patients presenting with hypertensive urgency in the office setting. JAMA Intern Med. 2016 Jul 1; 176(7): 981-8.   12:07 PM:  I have discussed the diagnosis/risks/treatment options with the patient and family and believe the pt to be eligible for discharge home to follow-up with PCP, neuro. We also discussed returning to the ED immediately if new or worsening sx occur. We discussed the sx which are most concerning (e.g., sudden worsening pain, fever, inability to tolerate by mouth) that necessitate immediate return. Medications administered to the patient during their visit and any new prescriptions provided to the patient are listed below.  Medications given during this visit Medications  potassium chloride SA (K-DUR,KLOR-CON) CR tablet 40 mEq (not administered)  magnesium oxide (MAG-OX) tablet 800 mg (not administered)  The patient appears reasonably screen and/or stabilized for discharge and I doubt any other medical condition or other [facility] requiring further screening, evaluation, or treatment in the ED at this time prior to discharge.    Final Clinical Impressions(s) / ED Diagnoses   Final diagnoses:  Asymptomatic hypertension      ED Discharge Orders    None       Deno Etienne, DO 09/04/17 1207

## 2017-09-07 ENCOUNTER — Ambulatory Visit (INDEPENDENT_AMBULATORY_CARE_PROVIDER_SITE_OTHER): Payer: Medicaid Other

## 2017-09-07 DIAGNOSIS — E538 Deficiency of other specified B group vitamins: Secondary | ICD-10-CM

## 2017-09-07 NOTE — Progress Notes (Signed)
B 12 INJECTION IN RIGHT DELTOID. Patient tolerated well.

## 2017-09-13 ENCOUNTER — Ambulatory Visit (INDEPENDENT_AMBULATORY_CARE_PROVIDER_SITE_OTHER): Payer: Medicaid Other

## 2017-09-13 DIAGNOSIS — E538 Deficiency of other specified B group vitamins: Secondary | ICD-10-CM

## 2017-09-20 ENCOUNTER — Ambulatory Visit (INDEPENDENT_AMBULATORY_CARE_PROVIDER_SITE_OTHER): Payer: Medicaid Other

## 2017-09-20 DIAGNOSIS — E538 Deficiency of other specified B group vitamins: Secondary | ICD-10-CM | POA: Diagnosis not present

## 2017-09-27 ENCOUNTER — Ambulatory Visit: Payer: Medicaid Other

## 2017-10-10 ENCOUNTER — Encounter (HOSPITAL_COMMUNITY): Payer: Self-pay | Admitting: Family Medicine

## 2017-10-10 ENCOUNTER — Ambulatory Visit (HOSPITAL_COMMUNITY)
Admission: EM | Admit: 2017-10-10 | Discharge: 2017-10-10 | Disposition: A | Discharge status: Home / Self Care | Payer: Medicaid Other | Attending: Family Medicine | Admitting: Family Medicine

## 2017-10-10 DIAGNOSIS — M542 Cervicalgia: Secondary | ICD-10-CM

## 2017-10-10 DIAGNOSIS — M5412 Radiculopathy, cervical region: Secondary | ICD-10-CM

## 2017-10-10 NOTE — Discharge Instructions (Signed)
Episode earlier today likely related to neck spinal stenosis.   No immediate concern to go to emergency room right now.  Please go straight to emergency room if experiencing changes in vision, blackening of vision, severe headache, difficulty speaking, one sided weakness/paralysis, dizziness, chest pain, shortness of breath.

## 2017-10-10 NOTE — ED Provider Notes (Signed)
[place]    CSN: [id number] Arrival date & time: 10/10/17  1817     History   Chief Complaint Chief Complaint  Patient presents with  . Neck Pain    HPI [name] is a 56 y.o. male with complicated PMH presenting after episode of sharp cervical pain and weakness in both arms. Episode lasted 3-4 minutes. He was eating when this happened. No associated changes in vision, dizziness, headache. Paitent has headache and intermittent blurry vision that is normal for him. Known cervical stenosis on MRI (results below). History of stroke in 2016 with more recent TIA. Neck pain now is an aching sensation at the base of his neck. He sees multiple specialist for pain management and to follow his multiple issues.  MRI from 05/2017 : 1. 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 Stenosis is greatest at C5-6 where there is mild cord flattening. 2. C5-6 right foraminal impingement from asymmetric uncovertebral spurring. No definite foraminal impingement on the left  Surgery on neck deferred until he can lose some weight. Receives injections into neck.   HPI  Past Medical History:  Diagnosis Date  . Arthritis   . Back pain   . CAD (coronary artery disease)    a. s/p DES to LAD in 05/2016  . DVT (deep venous thrombosis) ([place])   . Hyperlipidemia   . Hypertension   . IBS (irritable bowel syndrome)   . OSA (obstructive sleep apnea)   . Pancreatitis   . PE (pulmonary thromboembolism) ([place])   . PUD (peptic ulcer disease)   . Renal disorder   . Stroke ([place])   . Type 2 diabetes mellitus [facility])     Patient Active Problem List   Diagnosis Date Noted  . B12 deficiency 08/10/2017  . Persistent headaches 08/09/2017  . GERD (gastroesophageal reflux disease) 06/29/2017  . Left arm numbness   . Cerebral embolism with cerebral infarction 06/17/2017  . TIA (transient ischemic attack) 06/17/2017    . Chronic back pain 12/29/2016  . Depression 12/15/2016  . Vitamin D deficiency 12/09/2016  . Right hip pain 12/07/2016  . Hypokalemia 12/07/2016  . Type 2 diabetes mellitus with vascular disease ([place]) 05/31/2016  . Normocytic normochromic anemia 05/31/2016  . Chest pain 05/31/2016  . CAD (coronary artery disease) 01/06/2016  . DVT (deep venous thrombosis) ([place]) 01/06/2016  . Lactic acidosis 05/28/2014  . Nonspecific chest pain 01/29/2014  . Uncontrolled secondary diabetes with peripheral neuropathy ([place]) 01/29/2014  . Obesity, Class III, BMI 40-49.9 (morbid obesity) ([place]) 01/29/2014  . Snoring 01/29/2014  . Dyslipidemia 01/29/2014  . HTN (hypertension) 01/29/2014  . Abnormal nuclear stress test 01/29/2014    Past Surgical History:  Procedure Laterality Date  . CARDIAC CATHETERIZATION N/A 05/31/2016   Procedure: Left Heart Cath and Coronary Angiography;  Surgeon: Peter M Martinique, MD;  Location: [place] CV LAB;  Service: Cardiovascular;  Laterality: N/A;  . CARDIAC CATHETERIZATION N/A 05/31/2016   Procedure: Intravascular Pressure Wire/FFR Study;  Surgeon: Peter M Martinique, MD;  Location: [place] CV LAB;  Service: Cardiovascular;  Laterality: N/A;  . CARDIAC CATHETERIZATION N/A 05/31/2016   Procedure: Coronary Stent Intervention;  Surgeon: Peter M Martinique, MD;  Location: [place] CV LAB;  Service: Cardiovascular;  Laterality: N/A;  . LEFT HEART CATHETERIZATION WITH CORONARY ANGIOGRAM N/A 02/03/2014   Procedure: LEFT HEART CATHETERIZATION WITH CORONARY ANGIOGRAM;  Surgeon: Pixie Casino, MD;  Location: [facility] CATH  LAB;  Service: Cardiovascular;  Laterality: N/A;  . left leg stent          Home Medications    Prior to Admission medications   Medication Sig Start Date End Date Taking? Authorizing Provider  ACCU-CHEK SOFTCLIX LANCETS lancets 1 each as needed. 02/03/17   [provider]  aspirin 81 MG chewable tablet Chew 1 tablet (81 mg total) by mouth daily. 06/01/16    Elwin Mocha, MD  atorvastatin (LIPITOR) 80 MG tablet Take 1 tablet (80 mg total) by mouth daily. 01/26/17   Ahmed Prima, Fransisco Hertz, PA-C  carvedilol (COREG) 25 MG tablet Take 1 tablet (25 mg total) by mouth 2 (two) times daily with a meal. 01/26/17   Strader, [place] M, PA-C  clopidogrel (PLAVIX) 75 MG tablet Take 75 mg by mouth daily.    [provider]  famotidine (PEPCID) 40 MG tablet Take 1 tablet (40 mg total) by mouth at bedtime. Patient taking differently: Take 40 mg by mouth daily.  06/18/17   Barton Dubois, MD  furosemide (LASIX) 40 MG tablet Take 1 tablet (40 mg total) by mouth 2 (two) times daily. 01/26/17   Strader, Fransisco Hertz, PA-C  glipiZIDE (GLUCOTROL) 10 MG tablet Take 10 mg by mouth 2 (two) times daily. 05/30/17   [provider]  hydrALAZINE (APRESOLINE) 50 MG tablet Take 1 tablet (50 mg total) by mouth 3 (three) times daily. 07/11/17   Barrett, Evelene Croon, PA-C  Insulin Detemir (LEVEMIR) 100 UNIT/ML Pen Inject 20 Units into the skin daily at 10 pm. Patient taking differently: Inject 50 Units daily at 10 pm into the skin.  06/18/17   Barton Dubois, MD  Insulin Pen Needle 31G X 5 MM MISC Use 1 needle daily to inject insulin as prescribed 06/18/17   Barton Dubois, MD  isosorbide dinitrate (ISORDIL) 30 MG tablet Take 1 tablet (30 mg total) by mouth 2 (two) times daily. Patient taking differently: Take 30 mg by mouth every morning.  11/22/16   Strader, Fransisco Hertz, PA-C  ketoconazole (NIZORAL) 2 % cream Apply 1 application daily as needed topically for irritation.  07/04/17   [provider]  LYRICA 75 MG capsule Take 75 mg by mouth 2 (two) times daily. 04/03/17   [provider]  metFORMIN (GLUCOPHAGE-XR) 500 MG 24 hr tablet Take 2 tablets (1,000 mg total) by mouth 2 (two) times daily. 07/02/17   Barton Dubois, MD  nitroGLYCERIN (NITROSTAT) 0.4 MG SL tablet Place 0.4 mg under the tongue every 5 (five) minutes as needed for chest pain. 12/17/16   [provider]  ondansetron (ZOFRAN ODT) 4 MG disintegrating tablet Take 1 tablet (4 mg total) by mouth every 8 (eight) hours as needed for nausea or vomiting. 06/09/17   Varney Biles, MD  ondansetron (ZOFRAN) 4 MG tablet Take 1 tablet (4 mg total) every 8 (eight) hours as needed by mouth for nausea or vomiting. 09/02/17   Harris, Vernie Shanks, PA-C  oxyCODONE (ROXICODONE) 15 MG immediate release tablet Take 15 mg 3 (three) times daily as needed by mouth for pain.  08/28/17   [provider]  Oxycodone HCl 10 MG TABS Take 10 mg by mouth 2 (two) times daily as needed (for pain).  04/18/17   [provider]  pantoprazole (PROTONIX) 40 MG tablet Take 1 tablet (40 mg total) by mouth 2 (two) times daily. 06/18/17   Barton Dubois, MD  potassium chloride SA (K-DUR,KLOR-CON) 20 MEQ tablet Take 20 mEq by mouth daily.  [provider]  propranolol ER (INDERAL LA) 80 MG 24 hr capsule Take 1 capsule (80 mg total) by mouth at bedtime. 08/09/17   Marcial Pacas, MD  sertraline (ZOLOFT) 50 MG tablet Take 50 mg by mouth daily.    [provider]  sitaGLIPtin (JANUVIA) 100 MG tablet Take 100 mg by mouth daily.    [provider]  terbinafine (LAMISIL) 250 MG tablet Take 250 mg daily by mouth. 08/09/17   [provider]  tiZANidine (ZANAFLEX) 2 MG tablet Take 2 mg by mouth every 6 (six) hours as needed for muscle spasms.    [provider]  traZODone (DESYREL) 50 MG tablet Take 1 tablet (50 mg total) by mouth at bedtime. 12/15/16   Florinda Marker, MD    Family History Family History  Problem Relation Age of Onset  . Cancer Father   . Hypertension Mother   . Diabetes Mother   . Breast cancer Mother   . Hypertension Brother   . Diabetes Brother   . Hypertension Sister   . Diabetes Sister     Social History Social History   Tobacco Use  . Smoking status: Never Smoker  . Smokeless tobacco: Never Used  Substance Use Topics  . Alcohol use: No  . Drug  use: No     Allergies   Coconut flavor [flavoring agent]; Coconut oil; Ibuprofen; and Aleve [naproxen]   Review of Systems Review of Systems  Constitutional: Negative for fatigue and fever.  HENT: Negative for trouble swallowing.   Eyes: Negative for visual disturbance.  Respiratory: Negative for shortness of breath.   Cardiovascular: Negative for chest pain.  Gastrointestinal: Negative for abdominal pain, nausea and vomiting.  Musculoskeletal: Positive for back pain, neck pain and neck stiffness.  Skin: Negative for rash.  Neurological: Positive for weakness, numbness and headaches. Negative for dizziness, speech difficulty and light-headedness.     Physical Exam Triage Vital Signs ED Triage Vitals  Enc Vitals Group     BP 10/10/17 1902 (!) 184/89     Pulse Rate 10/10/17 1902 78     Resp 10/10/17 1902 18     Temp 10/10/17 1902 98.3 F (36.8 C)     Temp src --      SpO2 10/10/17 1902 98 %     Weight --      Height --      Head Circumference --      Peak Flow --      Pain Score 10/10/17 1901 7     Pain Loc --      Pain Edu? --      Excl. in [place]? --    No data found.  Updated Vital Signs BP (!) 184/89   Pulse 78   Temp 98.3 F (36.8 C)   Resp 18   SpO2 98%  BP rechecked- 159/69  Visual Acuity Right Eye Distance:   20/70 Left Eye Distance:   20/80 Bilateral Distance:  20/70 Patient normally wears glasses and does not have on today  Physical Exam  Constitutional: He is oriented to person, place, and time. He appears well-developed and well-nourished.  HENT:  Head: Normocephalic and atraumatic.  Eyes: Conjunctivae are normal.  Neck: Neck supple.  Cardiovascular: Normal rate and regular rhythm.  No murmur heard. Pulmonary/Chest: Effort normal and breath sounds normal. No respiratory distress.  Abdominal: Soft. There is no tenderness.  Musculoskeletal: He exhibits no edema.  Neurological: He is alert and oriented to person, place, and  time. He has normal  strength and normal reflexes. No cranial nerve deficit or sensory deficit. Coordination normal.  Cranial nerves 2-12 grossly intact. Strength and sensation normal and symmetric. Normal finger to nose, rapid alternating movements   Skin: Skin is warm and dry.  Psychiatric: He has a normal mood and affect.  Nursing note and vitals reviewed.    UC Treatments / Results  Labs (all labs ordered are listed, but only abnormal results are displayed) Labs Reviewed - No data to display  EKG  EKG Interpretation None      Lab Results  Component Value Date   HGBA1C 9.7 (H) 06/18/2017   Lab Results  Component Value Date   CHOL 81 06/18/2017   HDL 28 (L) 06/18/2017   LDLCALC 2 06/18/2017   TRIG 255 (H) 06/18/2017   CHOLHDL 2.9 06/18/2017    Radiology No results found.  Procedures Procedures (including critical care time)  Medications Ordered in UC Medications - No data to display   Initial Impression / Assessment and Plan / UC Course  I have reviewed the triage vital signs and the nursing notes.  Pertinent labs & imaging results that were available during my care of the patient were reviewed by me and considered in my medical decision making (see chart for details).    Patient does not seem to be experiencing any neuro deficits from his baseline. Neuro exam grossly normal. Blood pressure decreased from initial reading. Weakness bilateral vs unilateral with no vision changes, no speech difficulty; does not appear to be experiencing CVA/TIA. Given known history of cervical stenosis  And impingement likely an acute episode of nerve impingement with quick resolution.  Advised to follow up with Primary care and Neurosurgeon.    Discussed return precautions. Patient verbalized understanding and is agreeable with plan.  Final Clinical Impressions(s) / UC Diagnoses   Final diagnoses:  None    ED Discharge Orders    None       Controlled Substance Prescriptions [place]  Controlled Substance Registry consulted? Not Applicable   Janith Lima, PA-C 10/10/17 2113    Janith Lima, PA-C 10/10/17 2113

## 2017-10-10 NOTE — ED Triage Notes (Signed)
Pt here today with episode of cervical pain and numbness,  Weakness, in both arms reports he felt like he was paralyzed. This lasted about 4 minutes and resolved. Only complaint now is that his neck hurts.

## 2017-10-16 ENCOUNTER — Other Ambulatory Visit: Payer: Self-pay | Admitting: Orthopedic Surgery

## 2017-10-16 DIAGNOSIS — M4722 Other spondylosis with radiculopathy, cervical region: Secondary | ICD-10-CM

## 2017-10-20 ENCOUNTER — Ambulatory Visit (INDEPENDENT_AMBULATORY_CARE_PROVIDER_SITE_OTHER): Payer: Medicaid Other

## 2017-10-20 DIAGNOSIS — E538 Deficiency of other specified B group vitamins: Secondary | ICD-10-CM | POA: Diagnosis not present

## 2017-10-20 MED ORDER — CYANOCOBALAMIN 1000 MCG/ML IJ SOLN
1000.0000 ug | Freq: Once | INTRAMUSCULAR | Status: AC
  Administered 2017-10-20: 1000 ug via INTRAMUSCULAR

## 2017-10-25 ENCOUNTER — Ambulatory Visit
Admission: RE | Admit: 2017-10-25 | Discharge: 2017-10-25 | Disposition: A | Discharge status: Home / Self Care | Payer: Medicaid Other | Source: Ambulatory Visit | Attending: Orthopedic Surgery | Admitting: Orthopedic Surgery

## 2017-10-25 DIAGNOSIS — M4722 Other spondylosis with radiculopathy, cervical region: Secondary | ICD-10-CM

## 2017-10-29 IMAGING — DX DG HIP (WITH OR WITHOUT PELVIS) 2-3V*R*
3 series · 3 of 3 positions shown · non-contrast
Comparison: 12/07/2016

CLINICAL DATA: Right hip pain, worse over the last 3 days

EXAM:
DG HIP (WITH OR WITHOUT PELVIS) 2-3V RIGHT

[pelvis ap]
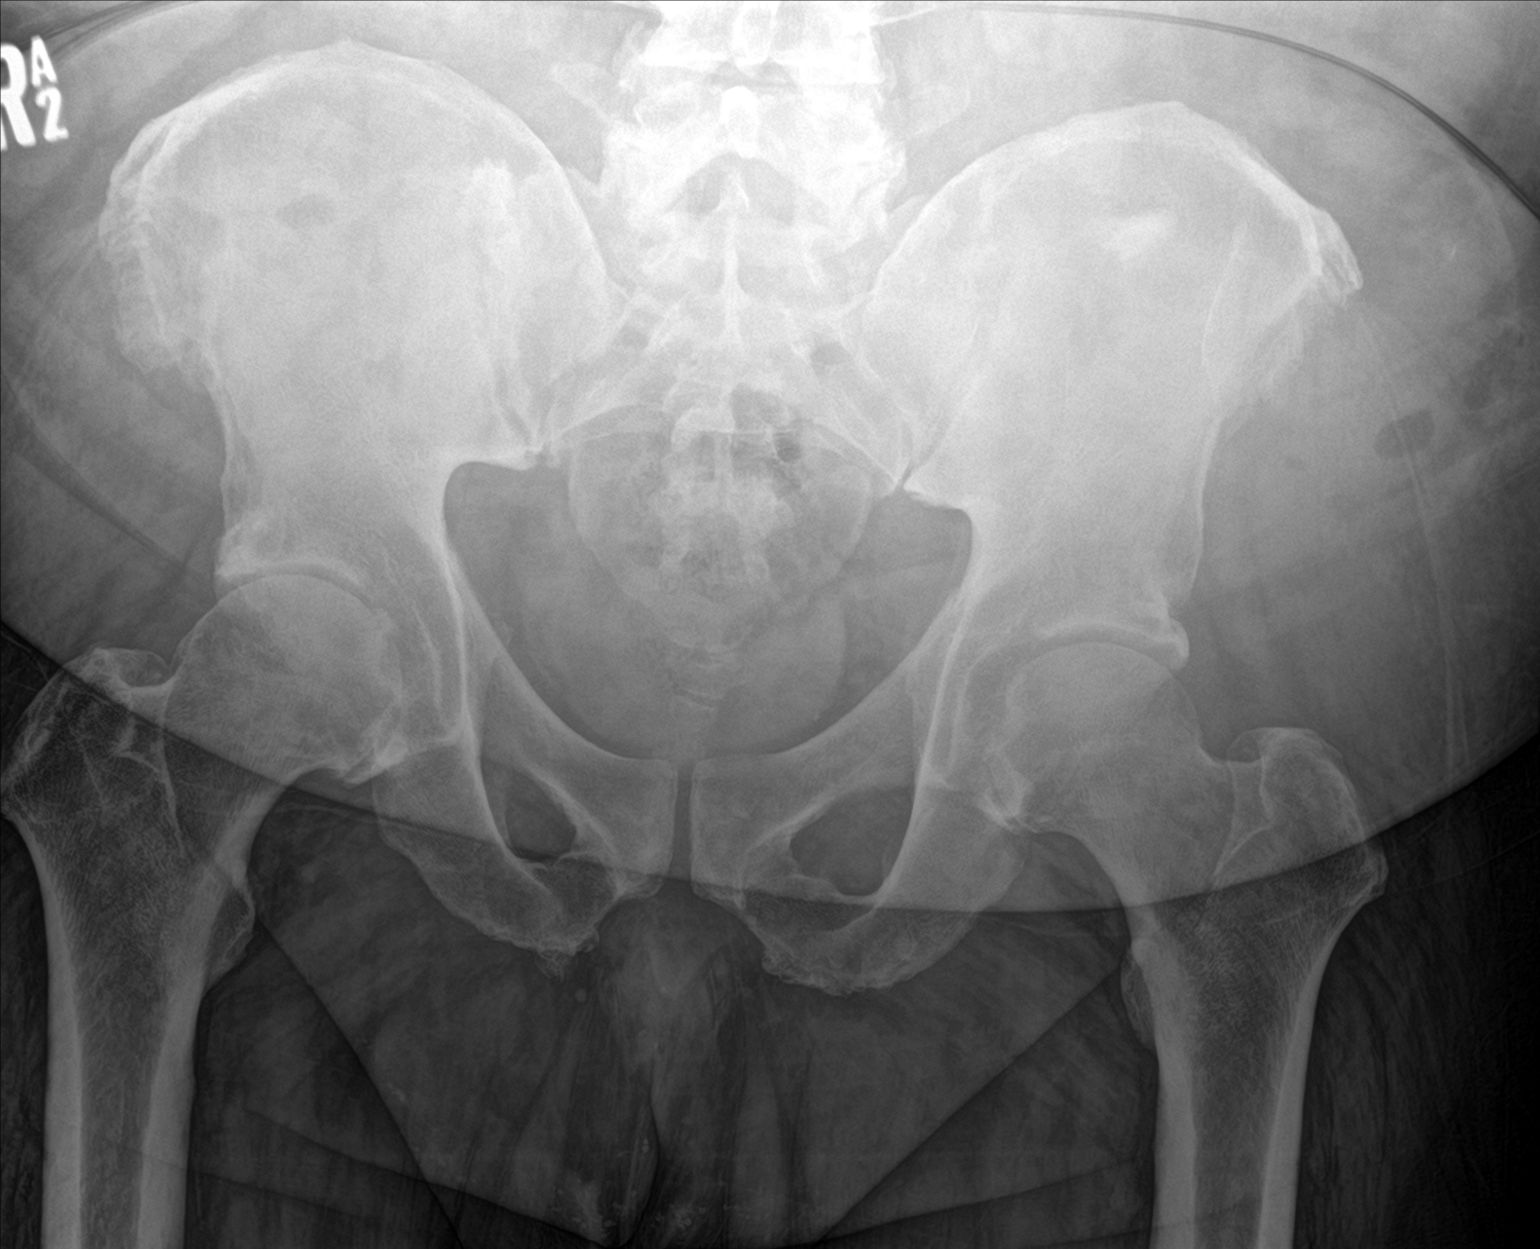

[hip ap]
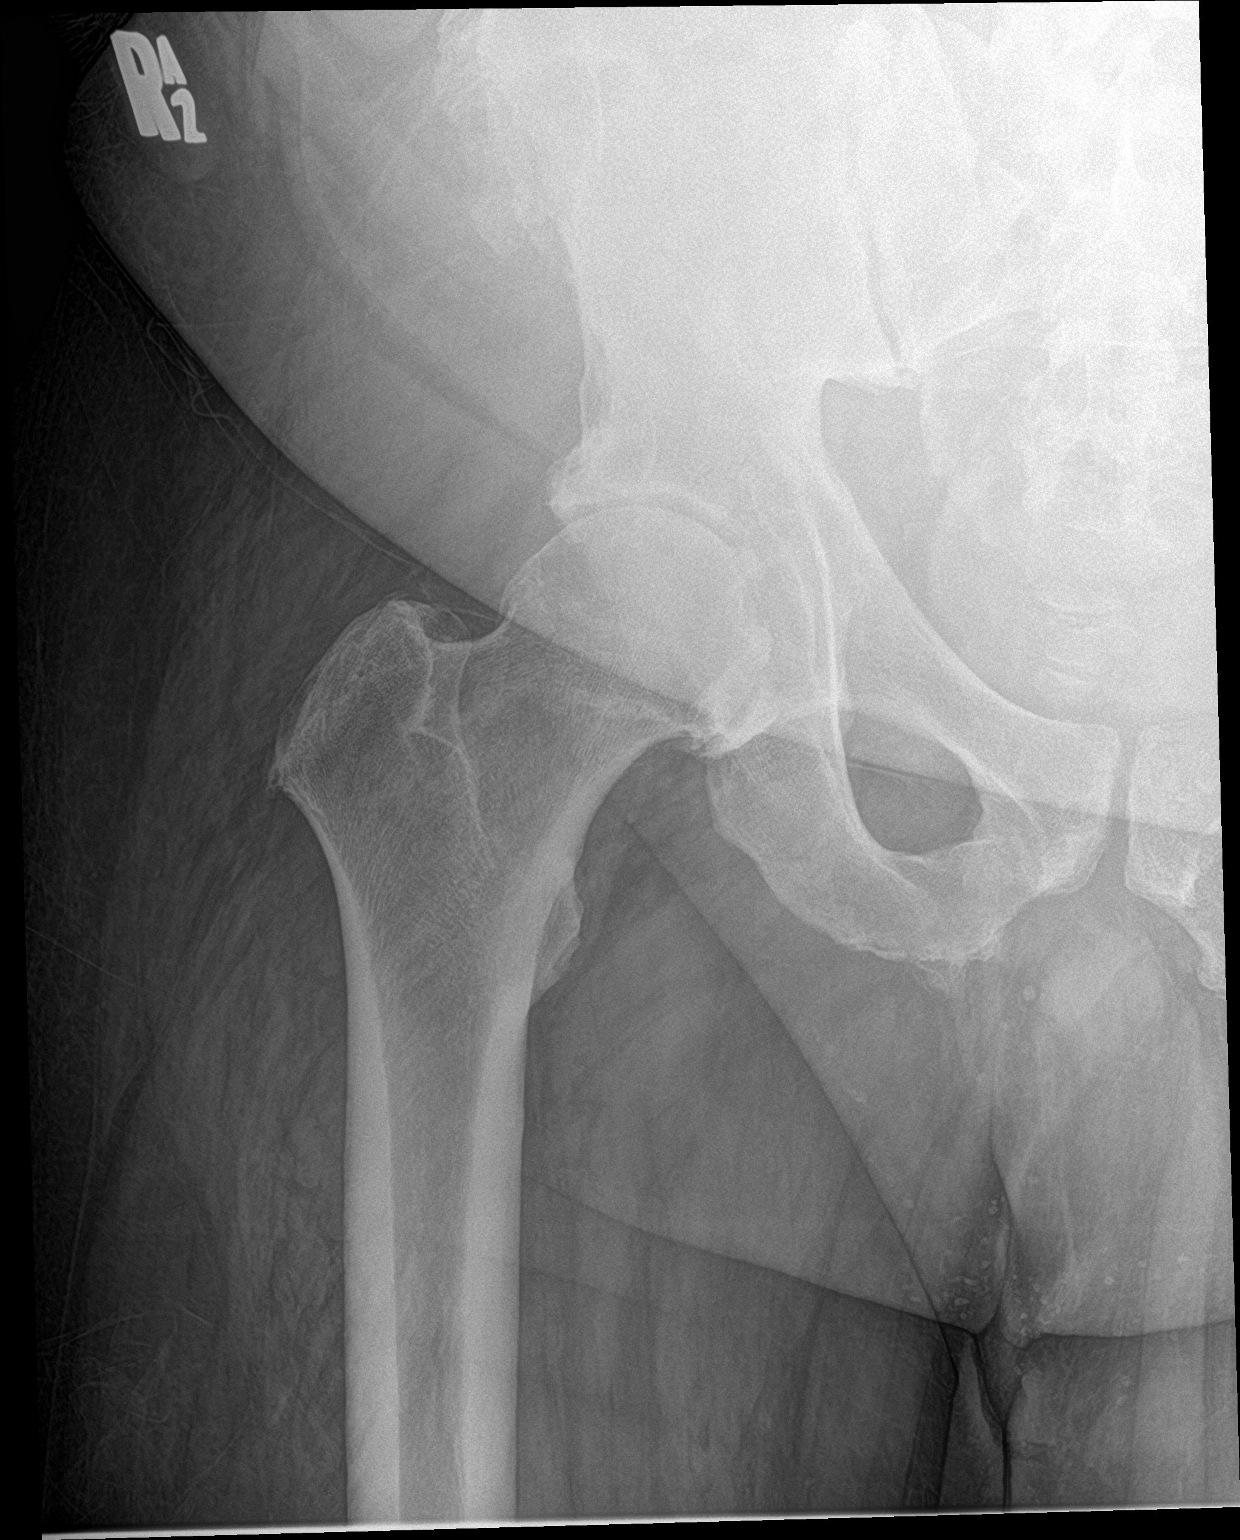

[hip lat]
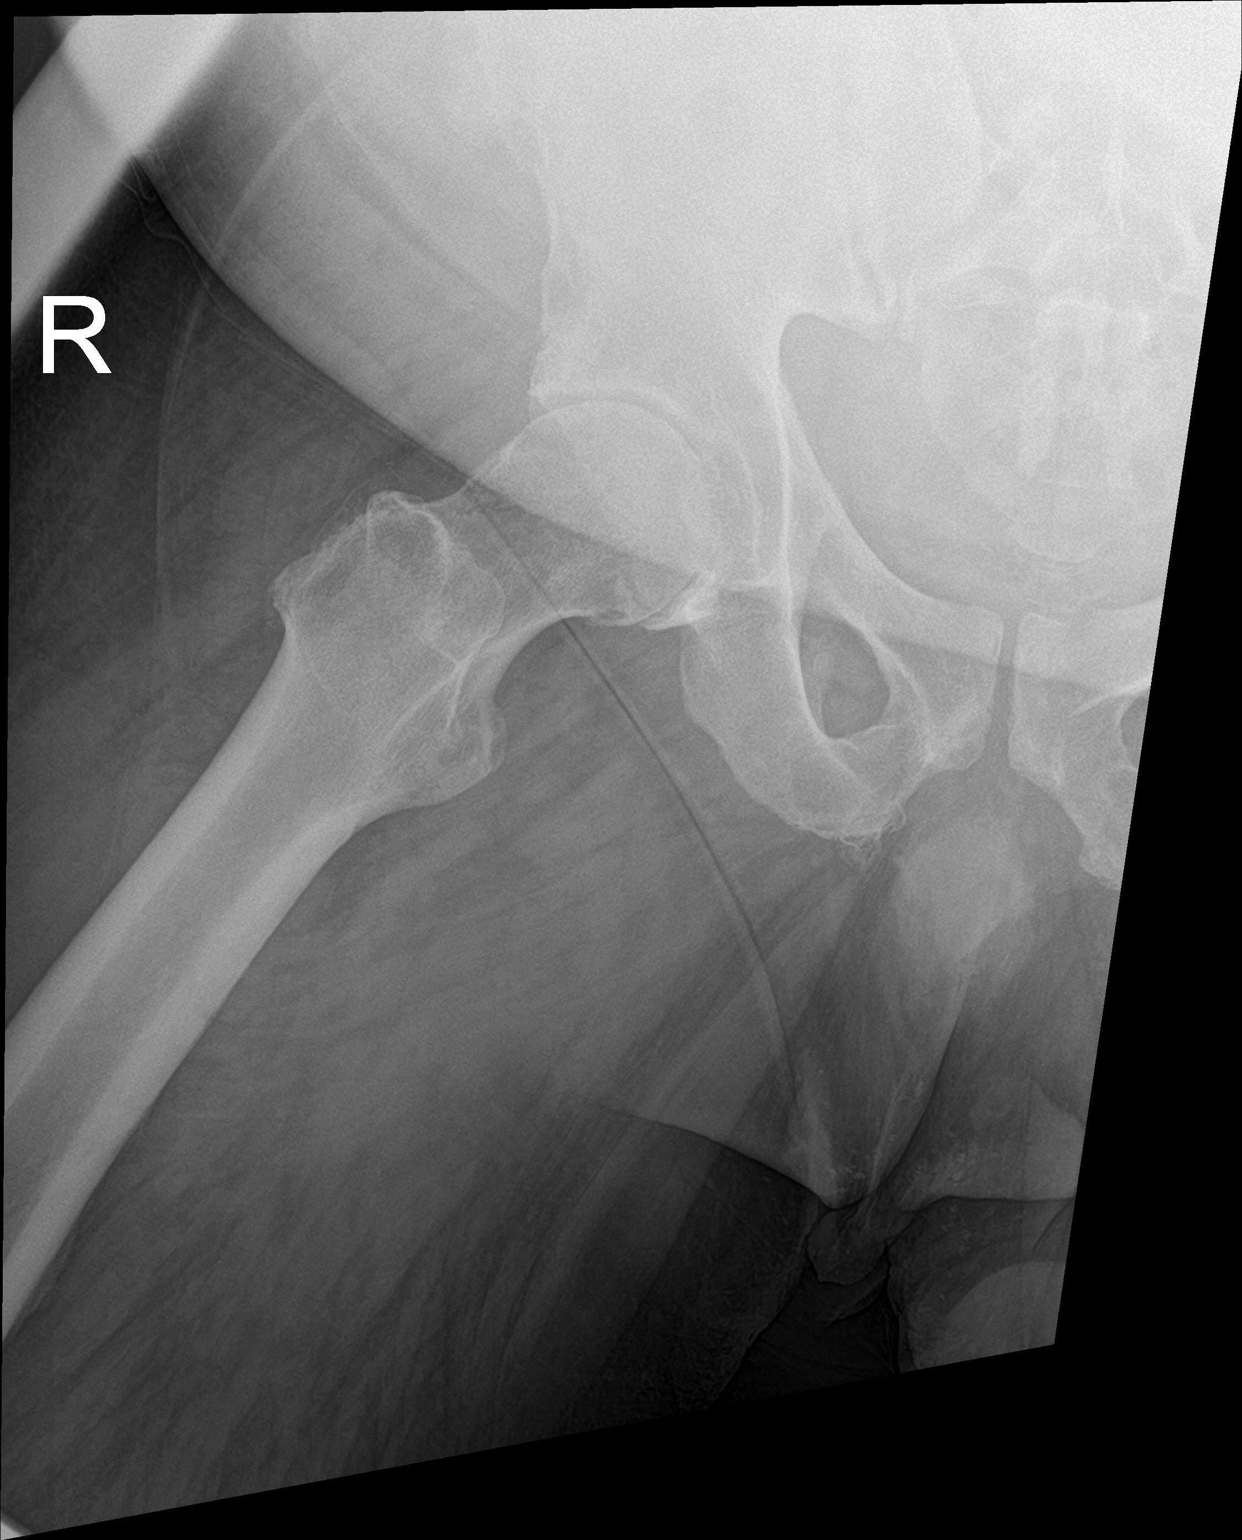

[3 of 3 positions shown; findings below may reference images not displayed]

FINDINGS: Moderately severe degenerative arthropathy of both hips with joint
space loss, sclerosis and bony spurring. No malalignment,
subluxation or dislocation. Hips appear symmetric. Normal SI joints
for age.
IMPRESSION: Moderately severe degenerative changes of both hips.

No acute osseous abnormality.

## 2017-11-06 ENCOUNTER — Ambulatory Visit: Payer: Medicaid Other | Admitting: Neurology

## 2017-11-06 ENCOUNTER — Encounter: Payer: Self-pay | Admitting: Neurology

## 2017-11-06 VITALS — BP 136/74 | HR 83 | Ht 70.0 in | Wt 325.0 lb

## 2017-11-06 DIAGNOSIS — R519 Headache, unspecified: Secondary | ICD-10-CM

## 2017-11-06 DIAGNOSIS — R269 Unspecified abnormalities of gait and mobility: Secondary | ICD-10-CM

## 2017-11-06 DIAGNOSIS — R51 Headache: Secondary | ICD-10-CM | POA: Diagnosis not present

## 2017-11-06 DIAGNOSIS — E538 Deficiency of other specified B group vitamins: Secondary | ICD-10-CM

## 2017-11-06 MED ORDER — DULOXETINE HCL 60 MG PO CPEP: 60.0000 mg | ORAL_CAPSULE | Freq: Every day | ORAL | 12 refills | Status: DC

## 2017-11-06 NOTE — Progress Notes (Signed)
PATIENT: [name] DOB: January 28, 1961  Chief Complaint  Patient presents with  . Follow-up     HISTORICAL  [name] is a 57 year old male, seen in refer by his primary care doctor  [name] for evaluation of headaches, initial evaluation was on August 09 2017. He came in with case worker.  I reviewed and summarized in note, he has history of hyperlipidemia, diabetes since 2012, vitamin D deficiency, obesity, coronary artery disease, stroke in August 2017, He presented with blurred vision, could not breath, pancreatitis,   He began to have headaches since 2017, starting from occipital region, pressure sensation, spreading forward to become bilateral frontal headaches, mild blurry vision, pressure pain, radiating his ear, he denies significant light noise sensitivity, goes all day long, take NSAIDs, muscle relaxant, which has been helpful,  He also complains of chronic neck pain, multiple joints pain, low back pain, hip pain, radiating pain to bilateral upper extremity, and hands, paresthesia,  Laboratory evaluations in June 2018, CMP, creatinine of 0.62, CBC hemoglobin of 12.5, A1c was elevated 9.4,  Personally reviewed MRI of the brain June 17 2017, no acute abnormality.  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, due to disc protrusion, posterior longitudinal ligament ossification, no cord signal change, no significant foraminal narrowing  CT angiogram of the chest, no evidence of pulmonary emboli, stable ectatic ascending thoracic aorta are   Labs, September 2018, UDS was positive for opiates, CBC hemoglobin of 11.3, normal BNP, negative troponin, lipase was 32, lipid profile, LDL was 2, triglyceride 255,     CT angiogram of the head and neck showed no large vessel disease, evidence of intracranial atherosclerotic disease  UPDATE Nov 06 2017: He still has right arm and leg numbness, weakness, occasionally left hand numbness, no gait difficulty, he has  true history of obesity, is using CPAP machine  Reviewed laboratory evaluations, CMP, creatinine 0.62, hemoglobin of 12 vitamin D 25, A1c was elevated 9.4,  He had a sudden onset acute worsening of neck pain December 2018, was evaluated at emergency room, had a repeat CT of cervical spine, multilevel degenerative changes, spinal stenosis, there was no significant spinal cord signal change.  He has gait abnormality due to knee pain, no bowel bladder incontinence  REVIEW OF SYSTEMS: Full 14 system review of systems performed and notable only for unexpected weight change, light sensitivity, double vision, ringing the ears, trouble swallowing, shortness of breath, chest pain, leg swelling, excessive thirst, abdominal pain, shortness of breath, restless leg, apnea, frequent awakening, daytime sleepiness, frequent urination, back pain, joint pain, anemia, dizziness, numbness, behavior change, anxiety  ALLERGIES: Allergies  Allergen Reactions  . Coconut Flavor [Flavoring Agent] Hives  . Coconut Oil Hives  . Ibuprofen Other (See Comments)    Made gastric ulcers worse  . Aleve [Naproxen]     DUE TO KIDNEYS    HOME MEDICATIONS: Current Outpatient Medications  Medication Sig Dispense Refill  . ACCU-CHEK SOFTCLIX LANCETS lancets 1 each as needed.    Marland Kitchen aspirin 81 MG chewable tablet Chew 1 tablet (81 mg total) by mouth daily. 30 tablet 0  . atorvastatin (LIPITOR) 80 MG tablet Take 1 tablet (80 mg total) by mouth daily. 90 tablet 3  . carvedilol (COREG) 25 MG tablet Take 1 tablet (25 mg total) by mouth 2 (two) times daily with a meal. 180 tablet 3  . clopidogrel (PLAVIX) 75 MG tablet Take 75 mg by mouth daily.    . famotidine (PEPCID) 40  MG tablet Take 1 tablet (40 mg total) by mouth at bedtime. (Patient taking differently: Take 40 mg by mouth daily. ) 30 tablet 2  . furosemide (LASIX) 40 MG tablet Take 1 tablet (40 mg total) by mouth 2 (two) times daily. 180 tablet 3  . glipiZIDE (GLUCOTROL) 10 MG  tablet Take 10 mg by mouth 2 (two) times daily.  4  . hydrALAZINE (APRESOLINE) 50 MG tablet Take 1 tablet (50 mg total) by mouth 3 (three) times daily. 270 tablet 1  . Insulin Detemir (LEVEMIR) 100 UNIT/ML Pen Inject 20 Units into the skin daily at 10 pm. (Patient taking differently: Inject 50 Units daily at 10 pm into the skin. ) 15 mL 3  . Insulin Pen Needle 31G X 5 MM MISC Use 1 needle daily to inject insulin as prescribed 100 each 2  . isosorbide dinitrate (ISORDIL) 30 MG tablet Take 1 tablet (30 mg total) by mouth 2 (two) times daily. (Patient taking differently: Take 30 mg by mouth every morning. ) 60 tablet 4  . ketoconazole (NIZORAL) 2 % cream Apply 1 application daily as needed topically for irritation.   1  . LYRICA 75 MG capsule Take 75 mg by mouth 2 (two) times daily.  2  . metFORMIN (GLUCOPHAGE-XR) 500 MG 24 hr tablet Take 2 tablets (1,000 mg total) by mouth 2 (two) times daily.    . nitroGLYCERIN (NITROSTAT) 0.4 MG SL tablet Place 0.4 mg under the tongue every 5 (five) minutes as needed for chest pain.  4  . ondansetron (ZOFRAN ODT) 4 MG disintegrating tablet Take 1 tablet (4 mg total) by mouth every 8 (eight) hours as needed for nausea or vomiting. 20 tablet 0  . ondansetron (ZOFRAN) 4 MG tablet Take 1 tablet (4 mg total) every 8 (eight) hours as needed by mouth for nausea or vomiting. 10 tablet 0  . oxyCODONE (ROXICODONE) 15 MG immediate release tablet Take 15 mg 3 (three) times daily as needed by mouth for pain.   0  . Oxycodone HCl 10 MG TABS Take 10 mg by mouth 2 (two) times daily as needed (for pain).   0  . pantoprazole (PROTONIX) 40 MG tablet Take 1 tablet (40 mg total) by mouth 2 (two) times daily. 60 tablet 1  . potassium chloride SA (K-DUR,KLOR-CON) 20 MEQ tablet Take 20 mEq by mouth daily.    . propranolol ER (INDERAL LA) 80 MG 24 hr capsule Take 1 capsule (80 mg total) by mouth at bedtime. 30 capsule 11  . sertraline (ZOLOFT) 50 MG tablet Take 50 mg by mouth daily.    .  sitaGLIPtin (JANUVIA) 100 MG tablet Take 100 mg by mouth daily.    Marland Kitchen terbinafine (LAMISIL) 250 MG tablet Take 250 mg daily by mouth.  0  . tiZANidine (ZANAFLEX) 2 MG tablet Take 2 mg by mouth every 6 (six) hours as needed for muscle spasms.    . traZODone (DESYREL) 50 MG tablet Take 1 tablet (50 mg total) by mouth at bedtime. 30 tablet 11   Current Facility-Administered Medications  Medication Dose Route Frequency Provider Last Rate Last Dose  . cyanocobalamin ((VITAMIN B-12)) injection 1,000 mcg  1,000 mcg Intramuscular Q30 days Marcial Pacas, MD        PAST MEDICAL HISTORY: Past Medical History:  Diagnosis Date  . Arthritis   . Back pain   . CAD (coronary artery disease)    a. s/p DES to LAD in 05/2016  . DVT (deep venous thrombosis) (  [place])   . Hyperlipidemia   . Hypertension   . IBS (irritable bowel syndrome)   . OSA (obstructive sleep apnea)   . Pancreatitis   . PE (pulmonary thromboembolism) ([place])   . PUD (peptic ulcer disease)   . Renal disorder   . Stroke ([place])   . Type 2 diabetes mellitus ([place])     PAST SURGICAL HISTORY: Past Surgical History:  Procedure Laterality Date  . CARDIAC CATHETERIZATION N/A 05/31/2016   Procedure: Left Heart Cath and Coronary Angiography;  Surgeon: Peter M Martinique, MD;  Location: [facility];  Service: Cardiovascular;  Laterality: N/A;  . CARDIAC CATHETERIZATION N/A 05/31/2016   Procedure: Intravascular Pressure Wire/FFR Study;  Surgeon: Peter M Martinique, MD;  Location: [facility];  Service: Cardiovascular;  Laterality: N/A;  . CARDIAC CATHETERIZATION N/A 05/31/2016   Procedure: Coronary Stent Intervention;  Surgeon: Peter M Martinique, MD;  Location: [facility];  Service: Cardiovascular;  Laterality: N/A;  . LEFT HEART CATHETERIZATION WITH CORONARY ANGIOGRAM N/A 02/03/2014   Procedure: LEFT HEART CATHETERIZATION WITH CORONARY ANGIOGRAM;  Surgeon: Pixie Casino, MD;  Location[facility];  Service: Cardiovascular;  Laterality: N/A;    . left leg stent       FAMILY HISTORY: Family History  Problem Relation Age of Onset  . Cancer Father   . Hypertension Mother   . Diabetes Mother   . Breast cancer Mother   . Hypertension Brother   . Diabetes Brother   . Hypertension Sister   . Diabetes Sister     SOCIAL HISTORY:  Social History   Socioeconomic History  . Marital status: Single    Spouse name: Not on file  . Number of children: 3  . Years of education: 67  . Highest education level: Not on file  Social Needs  . Financial resource strain: Not on file  . Food insecurity - worry: Not on file  . Food insecurity - inability: Not on file  . Transportation needs - medical: Not on file  . Transportation needs - non-medical: Not on file  Occupational History  . Occupation: Disabled  Tobacco Use  . Smoking status: Never Smoker  . Smokeless tobacco: Never Used  Substance and Sexual Activity  . Alcohol use: No  . Drug use: No  . Sexual activity: Not on file  Other Topics Concern  . Not on file  Social History Narrative   Independent and ambulatory with cane.   Lives at home alone.   Right-handed.   1-2 cups caffeine per day.     PHYSICAL EXAM   Vitals:   11/06/17 1541  BP: 136/74  Pulse: 83  Weight: (!) 325 lb (147.4 kg)  Height: 5\' 10"  (1.778 m)    Not recorded      Body mass index is 46.63 kg/m.  PHYSICAL EXAMNIATION:  Gen: NAD, conversant, well nourised, obese, well groomed                     Cardiovascular: Regular rate rhythm, no peripheral edema, warm, nontender. Eyes: Conjunctivae clear without exudates or hemorrhage Neck: Supple, no carotid bruits. Pulmonary: Clear to auscultation bilaterally   NEUROLOGICAL EXAM:  MENTAL STATUS: Speech:    Speech is normal; fluent and spontaneous with normal comprehension.  Cognition:     Orientation to time, place and person     Normal recent and remote memory     Normal Attention span and concentration     Normal  Language, naming,  repeating,spontaneous speech     Fund of knowledge   CRANIAL NERVES: CN II: Visual fields are full to confrontation. Fundoscopic exam is normal with sharp discs and no vascular changes. Pupils are round equal and briskly reactive to light. CN III, IV, VI: extraocular movement are normal. No ptosis. CN V: Facial sensation is intact to pinprick in all 3 divisions bilaterally. Corneal responses are intact.  CN VII: Face is symmetric with normal eye closure and smile. CN VIII: Hearing is normal to rubbing fingers CN IX, X: Palate elevates symmetrically. Phonation is normal. CN XI: Head turning and shoulder shrug are intact CN XII: Tongue is midline with normal movements and no atrophy.  MOTOR: There is no pronator drift of out-stretched arms. Muscle bulk and tone are normal. Muscle strength is normal.  REFLEXES: Reflexes are 2+ and symmetric at the biceps, triceps, knees, and ankles. Plantar responses are flexor.  SENSORY: Intact to light touch, pinprick, positional sensation and vibratory sensation are intact in fingers and toes.  COORDINATION: Rapid alternating movements and fine finger movements are intact. There is no dysmetria on finger-to-nose and heel-knee-shin.    GAIT/STANCE: He needs push up to get up from seated position, rely on his cane, obese, cautious, unsteady gait   DIAGNOSTIC DATA (LABS, IMAGING, TESTING) - I reviewed patient records, labs, notes, testing and imaging myself where available.   ASSESSMENT AND PLAN  [name] is a 57 y.o. male   New-onset of headaches Neck pain Gait abnormality  His headaches are likely multifactorial including obstructive sleep apnea, cervicogenic pain,  There is no evidence of cervical myelopathy  Will refer him to physical therapy,  He was noted to have mild droopy eyelid bilaterally, acetylcholine receptor antibody to rule out neuromuscular junctional disorder,   Marcial Pacas, M.D. Ph.D.  [facility] [address] [place], [place] [address] Ph: [phone] Fax: [phone]   CC: Rogers Blocker, MD

## 2017-11-09 ENCOUNTER — Telehealth: Payer: Self-pay | Admitting: *Deleted

## 2017-11-09 NOTE — Telephone Encounter (Signed)
Left patient a detailed message, with results, on voicemail (ok per DPR).  Provided our number to call back with any questions.  

## 2017-11-09 NOTE — Telephone Encounter (Signed)
-----   Message from Marcial Pacas, MD sent at 11/09/2017  5:15 PM EST ----- Please call patient: Laboratory evaluation showed elevated A1c 8.9, he should have better control of his diabetes.  Rest of the laboratory evaluation showed no significant abnormality

## 2017-11-11 LAB — ACETYLCHOLINE RECEPTOR AB, ALL
AChR Binding Ab, Serum: <0.03 nmol/L (ref 0.00–0.24)
Acetylchol Block Ab: 19 % (ref 0–25)
Acetylcholine Modulat Ab: <12 % (ref 0–20)

## 2017-11-11 LAB — HEMOGLOBIN A1C
Est. average glucose Bld gHb Est-mCnc: 209 mg/dL
Hgb A1c MFr Bld: 8.9 % — ABNORMAL HIGH (ref 4.8–5.6)

## 2017-11-14 ENCOUNTER — Inpatient Hospital Stay: Payer: Medicaid Other

## 2017-11-14 ENCOUNTER — Inpatient Hospital Stay: Payer: Medicaid Other | Attending: Oncology | Admitting: Oncology

## 2017-11-14 VITALS — BP 147/74 | HR 74 | Temp 98.5°F | Resp 18 | Ht 70.0 in | Wt 323.8 lb

## 2017-11-14 DIAGNOSIS — E538 Deficiency of other specified B group vitamins: Secondary | ICD-10-CM

## 2017-11-14 DIAGNOSIS — D649 Anemia, unspecified: Secondary | ICD-10-CM

## 2017-11-14 DIAGNOSIS — D72829 Elevated white blood cell count, unspecified: Secondary | ICD-10-CM

## 2017-11-14 DIAGNOSIS — D638 Anemia in other chronic diseases classified elsewhere: Secondary | ICD-10-CM | POA: Diagnosis present

## 2017-11-14 LAB — CBC WITH DIFFERENTIAL/PLATELET
Basophils Absolute: 0 10*3/uL (ref 0.0–0.1)
Basophils Relative: 0 %
Eosinophils Absolute: 0.1 10*3/uL (ref 0.0–0.5)
Eosinophils Relative: 1 %
HCT: 37.4 % — ABNORMAL LOW (ref 38.4–49.9)
Hemoglobin: 12 g/dL — ABNORMAL LOW (ref 13.0–17.1)
Lymphocytes Relative: 21 %
Lymphs Abs: 2.3 10*3/uL (ref 0.9–3.3)
MCH: 28.8 pg (ref 27.2–33.4)
MCHC: 32.1 g/dL (ref 32.0–36.0)
MCV: 89.7 fL (ref 79.3–98.0)
Monocytes Absolute: 0.6 10*3/uL (ref 0.1–0.9)
Monocytes Relative: 6 %
Neutro Abs: 8.1 10*3/uL — ABNORMAL HIGH (ref 1.5–6.5)
Neutrophils Relative %: 72 %
Platelets: 227 10*3/uL (ref 140–400)
RBC: 4.17 MIL/uL — ABNORMAL LOW (ref 4.20–5.82)
RDW: 14.7 % (ref 11.0–15.6)
WBC: 11.1 10*3/uL — ABNORMAL HIGH (ref 4.0–10.3)

## 2017-11-14 LAB — COMPREHENSIVE METABOLIC PANEL
ALT: 13 U/L (ref 0–55)
AST: 12 U/L (ref 5–34)
Albumin: 3.5 g/dL (ref 3.5–5.0)
Alkaline Phosphatase: 44 U/L (ref 40–150)
Anion gap: 11 (ref 3–11)
BUN: 11 mg/dL (ref 7–26)
CO2: 26 mmol/L (ref 22–29)
Calcium: 8.9 mg/dL (ref 8.4–10.4)
Chloride: 102 mmol/L (ref 98–109)
Creatinine, Ser: 0.8 mg/dL (ref 0.70–1.30)
GFR calc Af Amer: >60 mL/min (ref >60)
GFR calc non Af Amer: >60 mL/min (ref >60)
Glucose, Bld: 190 mg/dL — ABNORMAL HIGH (ref 70–140)
Potassium: 3.3 mmol/L — ABNORMAL LOW (ref 3.5–5.1)
Sodium: 139 mmol/L (ref 136–145)
Total Bilirubin: 0.5 mg/dL (ref 0.2–1.2)
Total Protein: 6.8 g/dL (ref 6.4–8.3)

## 2017-11-14 LAB — IRON AND TIBC
Iron: 44 ug/dL (ref 42–163)
Saturation Ratios: 13 % — ABNORMAL LOW (ref 42–163)
TIBC: 324 ug/dL (ref 202–409)
UIBC: 280 ug/dL

## 2017-11-14 LAB — SAVE SMEAR

## 2017-11-14 LAB — FERRITIN: Ferritin: 25 ng/mL (ref 22–316)

## 2017-11-14 NOTE — Progress Notes (Signed)
Hematology and Oncology Follow Up Visit  [name] [id number] [birth year] 57 y.o. 11/14/2017 12:15 PM [name] [name], MDDean, [name], MD   Principle Diagnosis: 57 year old gentleman with mild anemia.  His anemia is related to B12 deficiency and chronic disease diagnosed in 2018.    Current therapy: B12 injections on a monthly basis.  Interim History: [name] presents today for a follow-up visit.  Since the last visit, he was seen in the emergency department on multiple occasions in November and in December 2018.  He was complaining of neck pain and chest pain at times.  He is evaluation has been unrevealing for clear-cut diagnosis.  He was found to have low B12 levels and started on B12 injections on a monthly basis.  He reports he is able to drive and attend activities of daily living but does report symptoms of fatigue and mild dyspnea on exertion.  He denies any hematochezia or melena.  He does not report any headaches, blurry vision, syncope or seizures. Does not report any fevers, chills or sweats.  Does not report any cough, wheezing or hemoptysis.  Does not report any chest pain, palpitation, orthopnea or leg edema.  Does not report any nausea, vomiting or abdominal pain.  She does not report any constipation or diarrhea.  Does not report any skeletal complaints.   Does not report any skin rashes or lesions. Does not report any heat or cold intolerance.  Does not report any lymphadenopathy or petechiae.  Does not report any anxiety or depression.  Remaining review of systems is negative.    Medications: I have reviewed the patient's current medications.  Current Outpatient Medications  Medication Sig Dispense Refill  . ACCU-CHEK SOFTCLIX LANCETS lancets 1 each as needed.    Marland Kitchen aspirin 81 MG chewable tablet Chew 1 tablet (81 mg total) by mouth daily. 30 tablet 0  . atorvastatin (LIPITOR) 80 MG tablet Take 1 tablet (80 mg total) by mouth daily. 90 tablet 3  . carvedilol (COREG) 25  MG tablet Take 1 tablet (25 mg total) by mouth 2 (two) times daily with a meal. 180 tablet 3  . clopidogrel (PLAVIX) 75 MG tablet Take 75 mg by mouth daily.    . DULoxetine (CYMBALTA) 60 MG capsule Take 1 capsule (60 mg total) by mouth daily. 30 capsule 12  . famotidine (PEPCID) 40 MG tablet Take 1 tablet (40 mg total) by mouth at bedtime. (Patient taking differently: Take 40 mg by mouth daily. ) 30 tablet 2  . furosemide (LASIX) 40 MG tablet Take 1 tablet (40 mg total) by mouth 2 (two) times daily. 180 tablet 3  . glipiZIDE (GLUCOTROL) 10 MG tablet Take 10 mg by mouth 2 (two) times daily.  4  . hydrALAZINE (APRESOLINE) 50 MG tablet Take 1 tablet (50 mg total) by mouth 3 (three) times daily. 270 tablet 1  . Insulin Detemir (LEVEMIR) 100 UNIT/ML Pen Inject 20 Units into the skin daily at 10 pm. (Patient taking differently: Inject 50 Units daily at 10 pm into the skin. ) 15 mL 3  . Insulin Pen Needle 31G X 5 MM MISC Use 1 needle daily to inject insulin as prescribed 100 each 2  . isosorbide dinitrate (ISORDIL) 30 MG tablet Take 1 tablet (30 mg total) by mouth 2 (two) times daily. (Patient taking differently: Take 30 mg by mouth every morning. ) 60 tablet 4  . ketoconazole (NIZORAL) 2 % cream Apply 1 application daily as needed topically for irritation.  1  . LYRICA 75 MG capsule Take 75 mg by mouth 2 (two) times daily.  2  . metFORMIN (GLUCOPHAGE-XR) 500 MG 24 hr tablet Take 2 tablets (1,000 mg total) by mouth 2 (two) times daily.    . nitroGLYCERIN (NITROSTAT) 0.4 MG SL tablet Place 0.4 mg under the tongue every 5 (five) minutes as needed for chest pain.  4  . ondansetron (ZOFRAN ODT) 4 MG disintegrating tablet Take 1 tablet (4 mg total) by mouth every 8 (eight) hours as needed for nausea or vomiting. 20 tablet 0  . ondansetron (ZOFRAN) 4 MG tablet Take 1 tablet (4 mg total) every 8 (eight) hours as needed by mouth for nausea or vomiting. 10 tablet 0  . oxyCODONE (ROXICODONE) 15 MG immediate release  tablet Take 15 mg 3 (three) times daily as needed by mouth for pain.   0  . Oxycodone HCl 10 MG TABS Take 10 mg by mouth 2 (two) times daily as needed (for pain).   0  . pantoprazole (PROTONIX) 40 MG tablet Take 1 tablet (40 mg total) by mouth 2 (two) times daily. 60 tablet 1  . potassium chloride SA (K-DUR,KLOR-CON) 20 MEQ tablet Take 20 mEq by mouth daily.    . propranolol ER (INDERAL LA) 80 MG 24 hr capsule Take 1 capsule (80 mg total) by mouth at bedtime. 30 capsule 11  . sertraline (ZOLOFT) 50 MG tablet Take 50 mg by mouth daily.    . sitaGLIPtin (JANUVIA) 100 MG tablet Take 100 mg by mouth daily.    Marland Kitchen terbinafine (LAMISIL) 250 MG tablet Take 250 mg daily by mouth.  0  . tiZANidine (ZANAFLEX) 2 MG tablet Take 2 mg by mouth every 6 (six) hours as needed for muscle spasms.    . traZODone (DESYREL) 50 MG tablet Take 1 tablet (50 mg total) by mouth at bedtime. 30 tablet 11   Current Facility-Administered Medications  Medication Dose Route Frequency Provider Last Rate Last Dose  . cyanocobalamin ((VITAMIN B-12)) injection 1,000 mcg  1,000 mcg Intramuscular Q30 days Marcial Pacas, MD         Allergies:  Allergies  Allergen Reactions  . Coconut Flavor [Flavoring Agent] Hives  . Coconut Oil Hives  . Ibuprofen Other (See Comments)    Made gastric ulcers worse  . Aleve [Naproxen]     DUE TO KIDNEYS    Past Medical History, Surgical history, Social history, and Family History were reviewed and updated.   Physical Exam: Blood pressure (!) 147/74, pulse 74, temperature 98.5 F (36.9 C), temperature source Oral, resp. rate 18, height 5\' 10"  (1.778 m), weight (!) 323 lb 12.8 oz (146.9 kg), SpO2 98 %. ECOG: 1 General appearance: alert and cooperative appeared without distress. Head: Atraumatic. Oropharynx: Oral ulcers or lesions. Eyes: No scleral icterus. Lymph nodes: Cervical, supraclavicular, and axillary nodes normal. Heart:regular rate and rhythm, S1, S2 normal, no murmur, click, rub or  gallop Lung:chest clear, no wheezing, rales, normal symmetric air entry Abdomin: soft, non-tender, without masses or organomegaly Musculoskeletal: No joint deformity or effusions.   Lab Results: Lab Results  Component Value Date   WBC 11.1 (H) 11/14/2017   HGB 12.0 (L) 11/14/2017   HCT 37.4 (L) 11/14/2017   MCV 89.7 11/14/2017   PLT 227 11/14/2017     Chemistry      Component Value Date/Time   NA 139 09/04/2017 1028   NA 141 12/07/2016 1013   K 3.1 (L) 09/04/2017 1028   CL 100 (L)  09/04/2017 1028   CO2 27 09/04/2017 1028   BUN 10 09/04/2017 1028   BUN 9 12/07/2016 1013   CREATININE 0.72 09/04/2017 1028   CREATININE 0.92 08/30/2016 1526      Component Value Date/Time   CALCIUM 9.0 09/04/2017 1028   ALKPHOS 38 09/02/2017 1005   AST 35 09/02/2017 1005   ALT 33 09/02/2017 1005   BILITOT 0.7 09/02/2017 1005     Results for DUSTAN, HYAMS (MRN [id number]) as of 11/14/2017 12:10  Ref. Range 08/09/2017 16:54  Vitamin B12 Latest Ref Range: 232 - 1245 pg/mL <150 (L)    Impression and Plan:  57 year old gentleman with the following issues:  1.  Anemia: Multifactorial in nature with element of B12 deficiency in addition to chronic disease.  His hemoglobin is 12.0 today and currently receiving B12 injections on a monthly basis.   The rest of his workup is currently pending including serum protein electrophoresis as well as iron studies.  I do not believe there is a hematological disorder at this time including myelodysplasia or plasma cell disorder.  I recommend continuing B12 injections as he is receiving him right now.  2.  Leukocytosis: Reactive in nature without any suggestion of a primary hematological condition.  3.  Follow-up: I am happy to see him in the future as needed.  His hemoglobin start to drop in the future we will certainly reevaluate the differential diagnosis and other possibilities.   15 minutes was spent with the patient face-to-face today.  More than  50% of time was dedicated to patient counseling, education and coordination of the his care.   Zola Button, MD 1/22/201912:15 PM

## 2017-11-16 LAB — MULTIPLE MYELOMA PANEL, SERUM
Albumin SerPl Elph-Mcnc: 3.4 g/dL (ref 2.9–4.4)
Albumin/Glob SerPl: 1.1 (ref 0.7–1.7)
Alpha 1: 0.2 g/dL (ref 0.0–0.4)
Alpha2 Glob SerPl Elph-Mcnc: 0.7 g/dL (ref 0.4–1.0)
B-Globulin SerPl Elph-Mcnc: 1.3 g/dL (ref 0.7–1.3)
Gamma Glob SerPl Elph-Mcnc: 0.9 g/dL (ref 0.4–1.8)
Globulin, Total: 3.1 g/dL (ref 2.2–3.9)
IgA: 319 mg/dL (ref 90–386)
IgG (Immunoglobin G), Serum: 905 mg/dL (ref 700–1600)
IgM (Immunoglobulin M), Srm: 119 mg/dL (ref 20–172)
Total Protein ELP: 6.5 g/dL (ref 6.0–8.5)

## 2017-11-16 LAB — METHYLMALONIC ACID, SERUM: Methylmalonic Acid, Quantitative: 71 nmol/L (ref 0–378)

## 2017-11-17 ENCOUNTER — Encounter: Payer: Self-pay | Admitting: Physical Therapy

## 2017-11-17 ENCOUNTER — Ambulatory Visit: Payer: Medicaid Other | Attending: Neurology | Admitting: Physical Therapy

## 2017-11-17 DIAGNOSIS — M6281 Muscle weakness (generalized): Secondary | ICD-10-CM | POA: Diagnosis present

## 2017-11-17 DIAGNOSIS — M545 Low back pain: Secondary | ICD-10-CM | POA: Insufficient documentation

## 2017-11-17 DIAGNOSIS — G8929 Other chronic pain: Secondary | ICD-10-CM

## 2017-11-17 DIAGNOSIS — R293 Abnormal posture: Secondary | ICD-10-CM

## 2017-11-17 DIAGNOSIS — R2689 Other abnormalities of gait and mobility: Secondary | ICD-10-CM | POA: Insufficient documentation

## 2017-11-17 DIAGNOSIS — R2681 Unsteadiness on feet: Secondary | ICD-10-CM | POA: Diagnosis present

## 2017-11-17 NOTE — Therapy (Signed)
[facility] [address] [place], [place], [address] Phone: [phone]   Fax:  [phone]  Physical Therapy Evaluation  Patient Details  Name: [name] MRN: [id number] Date of Birth: 02-20-1961 Referring Provider: Marcial Pacas   Encounter Date: 11/17/2017  PT End of Session - 11/17/17 1623    Visit Number  1    Number of Visits  12 3 + 8 + eval    Date for PT Re-Evaluation  02/09/18    Authorization Type  Medicaid    PT Start Time  0806    PT Stop Time  0848    PT Time Calculation (min)  42 min    Activity Tolerance  Patient tolerated treatment well    Behavior During Therapy  [facility] for tasks assessed/performed       Past Medical History:  Diagnosis Date  . Arthritis   . Back pain   . CAD (coronary artery disease)    a. s/p DES to LAD in 05/2016  . DVT (deep venous thrombosis) ([place])   . Hyperlipidemia   . Hypertension   . IBS (irritable bowel syndrome)   . OSA (obstructive sleep apnea)   . Pancreatitis   . PE (pulmonary thromboembolism) ([place])   . PUD (peptic ulcer disease)   . Renal disorder   . Stroke ([place])   . Type 2 diabetes mellitus ([place])     Past Surgical History:  Procedure Laterality Date  . CARDIAC CATHETERIZATION N/A 05/31/2016   Procedure: Left Heart Cath and Coronary Angiography;  Surgeon: Peter M Martinique, MD;  Location: [place] [facility];  Service: Cardiovascular;  Laterality: N/A;  . CARDIAC CATHETERIZATION N/A 05/31/2016   Procedure: Intravascular Pressure Wire/FFR Study;  Surgeon: Peter M Martinique, MD;  Location: [place] [facility];  Service: Cardiovascular;  Laterality: N/A;  . CARDIAC CATHETERIZATION N/A 05/31/2016   Procedure: Coronary Stent Intervention;  Surgeon: Peter M Martinique, MD;  Location: [place] [facility];  Service: Cardiovascular;  Laterality: N/A;  . LEFT HEART CATHETERIZATION WITH CORONARY ANGIOGRAM N/A 02/03/2014   Procedure: LEFT HEART CATHETERIZATION WITH CORONARY  ANGIOGRAM;  Surgeon: Pixie Casino, MD;  Location: [facility];  Service: Cardiovascular;  Laterality: N/A;  . left leg stent       There were no vitals filed for this visit.   Subjective Assessment - 11/17/17 0809    Subjective  Pt reports stenosis, arthritis through shoulders, and "bad right hip".  Have a hard time getting up in the mornings.  From the recent CVA (Nov/Dec 2018 per patient report), I've noticed changes in balance.  Sometimes using a cane, but for the most part, trying to walk without it.  Has fallen twice in the past 6 months-leg just gave way.  Think my R leg is weaker.    Pertinent History  PMH includes CVA (last one December 2018-per patient report), arthritis R hip, DVT LLE, spinal stenosis C4-5, C5-6 with disc protrusion/no cord signal change; multi-level degenerative changes and spinal stenosis    Patient Stated Goals  Pt's goals for PT are to walk normal, without pain.    Currently in Pain?  Yes    Pain Score  6     Pain Location  Shoulder    Pain Orientation  Right;Left    Pain Descriptors / Indicators  Aching    Pain Type  Chronic pain    Pain Onset  More than a month ago "almost a year"  Pain Frequency  Constant    Aggravating Factors   cold weather    Pain Relieving Factors  gentle stretches and movement    Effect of Pain on Daily Activities  PT will attempt to address pain through exercises, posture/positioning education, possible use of modalities    Multiple Pain Sites  Yes    Pain Score  7    Pain Location  Back    Pain Orientation  Lower;Right    Pain Descriptors / Indicators  Aching;Sharp    Pain Type  Chronic pain    Pain Onset  More than a month ago a year    Pain Frequency  Constant    Aggravating Factors   "everything I do", don't even sleep in the bed (8-9 months)    Pain Relieving Factors  nothing much except injections    Pain Score  8    Pain Location  Hip    Pain Orientation  Right    Pain Descriptors / Indicators  Aching    Pain  Type  Chronic pain    Pain Onset  More than a month ago year ago at least    Pain Frequency  Constant    Aggravating Factors   walking, sitting sometimes    Pain Relieving Factors  injections    Effect of Pain on Daily Activities  Limitations from activities, on a bad day, "don't even get out of the house"         [facility] PT Assessment - 11/17/17 0820      Assessment   Medical Diagnosis  gait abnormality    Referring Provider  Marcial Pacas    Onset Date/Surgical Date  -- MD visit 11/06/17    Hand Dominance  Right      Precautions   Precautions  Fall      Balance Screen   Has the patient fallen in the past 6 months  Yes    How many times?  2    Has the patient had a decrease in activity level because of a fear of falling?   No    Is the patient reluctant to leave their home because of a fear of falling?   No      Home Environment   Living Environment  Private residence    Living Arrangements  Alone    Type of [place]  One level    Home Equipment  [place] - 4 wheels;Cane - single point;Shower seat      Prior Function   Level of Independence  Independent    Vocation  Retired;On disability    Leisure  Enjoyed sports  in the past, but does not do that anymore; has a young grandson and would like to be able to play with him.      Observation/Other Assessments   Focus on Therapeutic Outcomes (FOTO)   NA      Posture/Postural Control   Posture/Postural Control  Postural limitations    Postural Limitations  Rounded Shoulders;Forward head;Posterior pelvic tilt      ROM / Strength   AROM / PROM / Strength  Strength      Strength   Overall Strength Comments  grossly tested bilateral lower extremities:  5/5 hip flexion, quads, hamstrings; bilateral ankle dorsiflexion 4/5.  Grossly tested UE strength:  WFL, no deficits noted R vs. L      Transfers   Transfers  Sit to  Stand;Stand to Sit    Sit to Stand  6: Modified independent  (Device/Increase time);With upper extremity assist;From chair/3-in-1    Stand to Sit  6: Modified independent (Device/Increase time);With upper extremity assist;To chair/3-in-1      Ambulation/Gait   Ambulation/Gait  Yes    Ambulation/Gait Assistance  6: Modified independent (Device/Increase time)    Ambulation Distance (Feet)  250 Feet    Assistive device  None    Gait Pattern  Step-through pattern;Lateral trunk lean to left;Antalgic;Trendelenburg;Wide base of support    Ambulation Surface  Level    Gait velocity  12.94 sec = 2.53 ft/sec      Standardized Balance Assessment   Standardized Balance Assessment  Timed Up and Go Test;Dynamic Gait Index      Dynamic Gait Index   Level Surface  Mild Impairment    Change in Gait Speed  Normal    Gait with Horizontal Head Turns  Mild Impairment    Gait with Vertical Head Turns  Mild Impairment    Gait and Pivot Turn  Mild Impairment    Step Over Obstacle  Mild Impairment    Step Around Obstacles  Mild Impairment    Steps  Moderate Impairment    Total Score  16    DGI comment:  Scores <19/24 indicate increased fall risk.      Timed Up and Go Test   Normal TUG (seconds)  19.03 Reports this is better walking since recent injection    TUG Comments  Scores>13.5 seconds indicate increased fall risk.             Objective measurements completed on examination: See above findings.                PT Short Term Goals - 11/17/17 1634      PT SHORT TERM GOAL #1   Title  Pt will be independent with HEP to address back pain, balance, and gait.  TARGET 12/15/17    Baseline  No current HEP    Time  3    Period  Weeks    Status  New    Target Date  12/15/17      PT SHORT TERM GOAL #2   Title  Pt will improve improve TUG score to less than or equal to 15 seconds for decreased fall risk.    Baseline  TUG 19.03 seconds (Scores >13.5 seconds indicate increased fall risk.)    Time  3    Period  Weeks    Status  New    Target  Date  12/15/17      PT SHORT TERM GOAL #3   Title  Pt will verbalize understanding of posture/positioning with ADLs for improved functional mobility and decreased pain.    Baseline  rates pain as 6-8/10 in shoulders, hip, back at eval; sits in forward head, rounded shoulders, posterior pelvic tilt posture    Time  3    Period  Weeks    Status  New    Target Date  12/15/17      PT SHORT TERM GOAL #4   Title  Pt will improve gait velocity score to at least 2.62 ft/sec for improved efficiency and safety with gait.    Baseline  Gait velocity 2.53 ft/sec at eval; scores 1.31-2.62 ft/sec indicate limited community ambulator    Time  3    Period  Weeks    Status  New    Target Date  12/15/17  PT Long Term Goals - 11/17/17 1639      PT LONG TERM GOAL #1   Title  Pt will verbalize understanding of fall prevention in home environment.  TARGET 02/09/18    Baseline  at fall risk per DGI and TUG scores    Time  12    Period  Weeks    Status  New    Target Date  02/09/18      PT LONG TERM GOAL #2   Title  Pt will improve DGI score to at least 19/24 for decreased fall risk.    Baseline  DGI 16/24 (scores <19/24 indicate increased fall risk)    Time  12    Period  Weeks    Status  New    Target Date  02/09/18      PT LONG TERM GOAL #3   Title  Pt will rate at least 2 point decrease in pain scale for shoulder, hip, back pain, with functional mobility activities.    Baseline  rates pain 6-8/10 at eval    Time  12    Period  Weeks    Status  New    Target Date  02/09/18      PT LONG TERM GOAL #4   Title  Pt will improve TUGs core to less than or equal to 13.5 seconds for decreased fall risk.    Baseline  TUG score 19.03 sec at eval    Time  12    Period  Weeks    Status  New    Target Date  02/09/18      PT LONG TERM GOAL #5   Title  Pt will verbalize plans for ongoing community fitness upon D/C from PT.    Baseline  no current HEP or community fitness routine    Time  12     Period  Weeks    Status  New    Target Date  02/09/18             Plan - 11/17/17 1624    Clinical Impression Statement  Pt is a 57 year old male who presents to OP PT with diagnosis of gait abnormality, which pt attributes to pain from arthritis in hip and spinal stenosis in neck and low back.  He is limited in leisure activities and playing with his 76 yo grandson.  He has significant PMH and has had 2 falls in the past 6 months, due to leg giving way.  He presents to OP PT with decreased balance, abnormal gait, decreased strength, pain in shoulder/neck area, low back and hip (pain is not aggravated by resisted or ROM movements, except for slight radiating pain into L shoulder/arm area).  Pt is at risk of falls per DGI and TUG scores.  Pt would benefit from skilled PT to address the above stated deficits to improve functional mobility and decrease fall risk.    History and Personal Factors relevant to plan of care:  PMH:  arthritis, back pain, CAD, DVT, HTN, IBS, OSA, PE, CVA,  DM, MRI shows cervical spinal stenosis, disc protrusion with no cord signal damage, multilevel spinal stensosis and degenerative changes    Clinical Presentation  Evolving    Clinical Presentation due to:  2 falls in past 6 month; cervical spine, hip, and lumbar spine pain involvement    Clinical Decision Making  Moderate    Rehab Potential  Good    PT Frequency  Other (comment) 1x/wk for 3  weeks, then 2x/wk for 4 weeks    PT Duration  Other (comment) over 12 week period    PT Treatment/Interventions  ADLs/Self Care Home Management;Electrical Stimulation;Traction;Gait training;Functional mobility training;Ultrasound;Therapeutic activities;Therapeutic exercise;Balance training;DME Instruction;Neuromuscular re-education;Stair training;Patient/family education    PT Next Visit Plan  Initiate HEP and postural/positioning education; gait training with device for improved staiblity with gait    Consulted and Agree with  Plan of Care  Patient       Patient will benefit from skilled therapeutic intervention in order to improve the following deficits and impairments:  Abnormal gait, Decreased activity tolerance, Decreased balance, Decreased mobility, Decreased knowledge of use of DME, Decreased strength, Difficulty walking, Postural dysfunction, Pain  Visit Diagnosis: Other abnormalities of gait and mobility  Unsteadiness on feet  Abnormal posture  Muscle weakness (generalized)  Chronic right-sided low back pain without sciatica     Problem List Patient Active Problem List   Diagnosis Date Noted  . B12 deficiency 08/10/2017  . Persistent headaches 08/09/2017  . GERD (gastroesophageal reflux disease) 06/29/2017  . Left arm numbness   . Cerebral embolism with cerebral infarction 06/17/2017  . TIA (transient ischemic attack) 06/17/2017  . Chronic back pain 12/29/2016  . Depression 12/15/2016  . Vitamin D deficiency 12/09/2016  . Right hip pain 12/07/2016  . Hypokalemia 12/07/2016  . Type 2 diabetes mellitus with vascular disease ([place]) 05/31/2016  . Normocytic normochromic anemia 05/31/2016  . Chest pain 05/31/2016  . CAD (coronary artery disease) 01/06/2016  . DVT (deep venous thrombosis) ([place]) 01/06/2016  . Lactic acidosis 05/28/2014  . Nonspecific chest pain 01/29/2014  . Uncontrolled secondary diabetes with peripheral neuropathy ([place]) 01/29/2014  . Obesity, Class III, BMI 40-49.9 (morbid obesity) ([place]) 01/29/2014  . Snoring 01/29/2014  . Dyslipidemia 01/29/2014  . HTN (hypertension) 01/29/2014  . Abnormal nuclear stress test 01/29/2014    [place] Krist W. 11/17/2017, 4:47 PM  Frazier Butt., PT   [facility] [address] [place], [place], [address] Phone: [phone]   Fax:  [phone]  Name: VIKRAM TILLETT MRN: [id number] Date of Birth: Jul 05, 1961

## 2017-11-20 ENCOUNTER — Ambulatory Visit (INDEPENDENT_AMBULATORY_CARE_PROVIDER_SITE_OTHER): Payer: Medicaid Other

## 2017-11-20 DIAGNOSIS — E538 Deficiency of other specified B group vitamins: Secondary | ICD-10-CM

## 2017-11-20 MED ORDER — CYANOCOBALAMIN 1000 MCG/ML IJ SOLN
1000.0000 ug | Freq: Once | INTRAMUSCULAR | Status: AC
  Administered 2017-11-20: 1000 ug via INTRAMUSCULAR

## 2017-11-24 ENCOUNTER — Ambulatory Visit: Payer: Medicaid Other | Attending: Neurology | Admitting: Physical Therapy

## 2017-11-24 ENCOUNTER — Encounter: Payer: Self-pay | Admitting: Physical Therapy

## 2017-11-24 DIAGNOSIS — R2681 Unsteadiness on feet: Secondary | ICD-10-CM | POA: Diagnosis present

## 2017-11-24 DIAGNOSIS — M545 Low back pain: Secondary | ICD-10-CM | POA: Diagnosis present

## 2017-11-24 DIAGNOSIS — G8929 Other chronic pain: Secondary | ICD-10-CM | POA: Insufficient documentation

## 2017-11-24 DIAGNOSIS — R2689 Other abnormalities of gait and mobility: Secondary | ICD-10-CM | POA: Diagnosis not present

## 2017-11-24 DIAGNOSIS — R293 Abnormal posture: Secondary | ICD-10-CM | POA: Insufficient documentation

## 2017-11-24 DIAGNOSIS — M6281 Muscle weakness (generalized): Secondary | ICD-10-CM | POA: Insufficient documentation

## 2017-11-24 NOTE — Patient Instructions (Addendum)
Chair Sitting    Sit at edge of seat, spine straight, one leg extended. Put a hand on each thigh and bend forward from the hip, keeping spine straight. Allow hand on extended leg to reach toward toes. Support upper body with other arm. Hold _30__ seconds. Repeat _2-3_ times each leg per session. Do 1-2 sessions per day.  Copyright  VHI. All rights reserved.   Functional Quadriceps: Sit to Stand    Sit on edge of chair, feet flat on floor. Stand upright, extending knees fully. Repeat _10_ times per set. Do _1_ sets per session. Do _1-2_ sessions per day.  http://orth.exer.us/734   Copyright  VHI. All rights reserved.   Holding onto something sturdy for balance assistance:  Ankle Plantar Flexion / Dorsiflexion, Standing    Stand while holding a stable object. Rise up on toes. Then rock back on heels. Hold each position _3-5__ seconds. Repeat _10_ times per session. Do _1-2_ sessions per day.  Copyright  VHI. All rights reserved.   Hip Side Kick    Holding sturdy surface for balance, keep legs shoulder width apart and toes pointed forward. Bring one leg out to side, keeping knee straight.Do not lean . Repeat using other leg, alternating legs. Repeat _10_ times. Do _1-2_ sessions per day.  http://gt2.exer.us/342   Copyright  VHI. All rights reserved.   http://gt2.exer.us/483     HIP EXTENSION - STANDING  While standing, balance on one leg and move your other leg in a backward direction. Do not swing the leg. Perform smooth and controlled movements.   Keep your trunk stable and without arching during the movement.   Use your arms for support if needed for balance and safety.   10 reps each leg, 1-2 times a day.  Perform this for balance in a corner with a chair in front of you for safety:       Feet Together, Varied Arm Positions - Eyes Closed    Stand with feet together and arms at sides. Close eyes and visualize upright position. Hold __30__  seconds. Repeat __2-3__ times per session. Do _1-2_ sessions per day.  Copyright  VHI. All rights reserved.

## 2017-11-25 NOTE — Therapy (Signed)
[facility] [address] [place], [place], [address] Phone: [phone]   Fax:  [phone]  Physical Therapy Treatment  Patient Details  Name: [name] MRN: [id number] Date of Birth: 1961/01/28 Referring Provider: Marcial Pacas   Encounter Date: 11/24/2017  PT End of Session - 11/24/17 1535    Visit Number  2    Number of Visits  12 3 + 8 + eval    Date for PT Re-Evaluation  02/09/18    Authorization Type  Medicaid    Authorization - Visit Number  1    Authorization - Number of Visits  3    PT Start Time  1533    PT Stop Time  1612    PT Time Calculation (min)  39 min    Equipment Utilized During Treatment  Gait belt    Activity Tolerance  Patient tolerated treatment well    Behavior During Therapy  WFL for tasks assessed/performed       Past Medical History:  Diagnosis Date  . Arthritis   . Back pain   . CAD (coronary artery disease)    a. s/p DES to LAD in 05/2016  . DVT (deep venous thrombosis) ([place])   . Hyperlipidemia   . Hypertension   . IBS (irritable bowel syndrome)   . OSA (obstructive sleep apnea)   . Pancreatitis   . PE (pulmonary thromboembolism) ([facility])   . PUD (peptic ulcer disease)   . Renal disorder   . Stroke ([facility] [facility])   . Type 2 diabetes mellitus ([place])     Past Surgical History:  Procedure Laterality Date  . CARDIAC CATHETERIZATION N/A 05/31/2016   Procedure: Left Heart Cath and Coronary Angiography;  Surgeon: Peter M Martinique, MD;  Location: [facility];  Service: Cardiovascular;  Laterality: N/A;  . CARDIAC CATHETERIZATION N/A 05/31/2016   Procedure: Intravascular Pressure Wire/FFR Study;  Surgeon: Peter M Martinique, MD;  Location: [facility];  Service: Cardiovascular;  Laterality: N/A;  . CARDIAC CATHETERIZATION N/A 05/31/2016   Procedure: Coronary Stent Intervention;  Surgeon: Peter M Martinique, MD;  Location: [facility];  Service: Cardiovascular;  Laterality: N/A;  .  LEFT HEART CATHETERIZATION WITH CORONARY ANGIOGRAM N/A 02/03/2014   Procedure: LEFT HEART CATHETERIZATION WITH CORONARY ANGIOGRAM;  Surgeon: Pixie Casino, MD;  Location: [facility];  Service: Cardiovascular;  Laterality: N/A;  . left leg stent       There were no vitals filed for this visit.  Subjective Assessment - 11/24/17 1533    Subjective  Having back pain today. Was 9-10/10. He took a pain pill and muscle relaxer so now down to a 7/10. To therapy today without cane.      Pertinent History  PMH includes CVA (last one December 2018-per patient report), arthritis R hip, DVT LLE, spinal stenosis C4-5, C5-6 with disc protrusion/no cord signal change; multi-level degenerative changes and spinal stenosis    Patient Stated Goals  Pt's goals for PT are to walk normal, without pain.    Currently in Pain?  Yes    Pain Score  7     Pain Location  Back    Pain Orientation  Lower;Right    Pain Descriptors / Indicators  Aching    Pain Type  Chronic pain    Pain Onset  More than a month ago    Pain Frequency  Constant    Aggravating Factors   cold weather    Pain  Relieving Factors  gentle movements      issued the following to HEP today with cues on correct ex form and posture provided with practice in session\ today.   Chair Sitting    Sit at edge of seat, spine straight, one leg extended. Put a hand on each thigh and bend forward from the hip, keeping spine straight. Allow hand on extended leg to reach toward toes. Support upper body with other arm. Hold _30__ seconds. Repeat _2-3_ times each leg per session. Do 1-2 sessions per day.  Copyright  VHI. All rights reserved.   Functional Quadriceps: Sit to Stand    Sit on edge of chair, feet flat on floor. Stand upright, extending knees fully. Repeat _10_ times per set. Do _1_ sets per session. Do _1-2_ sessions per day.  http://orth.exer.us/734   Copyright  VHI. All rights reserved.   Holding onto something sturdy for balance  assistance:  Ankle Plantar Flexion / Dorsiflexion, Standing    Stand while holding a stable object. Rise up on toes. Then rock back on heels. Hold each position _3-5__ seconds. Repeat _10_ times per session. Do _1-2_ sessions per day.  Copyright  VHI. All rights reserved.   Hip Side Kick    Holding sturdy surface for balance, keep legs shoulder width apart and toes pointed forward. Bring one leg out to side, keeping knee straight.Do not lean . Repeat using other leg, alternating legs. Repeat _10_ times. Do _1-2_ sessions per day.  http://gt2.exer.us/342   Copyright  VHI. All rights reserved.   http://gt2.exer.us/483     HIP EXTENSION - STANDING  While standing, balance on one leg and move your other leg in a backward direction. Do not swing the leg. Perform smooth and controlled movements.   Keep your trunk stable and without arching during the movement.   Use your arms for support if needed for balance and safety.   10 reps each leg, 1-2 times a day.  Perform this for balance in a corner with a chair in front of you for safety:       Feet Together, Varied Arm Positions - Eyes Closed    Stand with feet together and arms at sides. Close eyes and visualize upright position. Hold __30__ seconds. Repeat __2-3__ times per session. Do _1-2_ sessions per day.  Copyright  VHI. All rights reserved.       PT Education - 11/24/17 1609    Education provided  Yes    Education Details  HEP for flexibility, strengthening and balance    Person(s) Educated  Patient    Methods  Demonstration;Explanation;Verbal cues;Handout       PT Short Term Goals - 11/17/17 1634      PT SHORT TERM GOAL #1   Title  Pt will be independent with HEP to address back pain, balance, and gait.  TARGET 12/15/17    Baseline  No current HEP    Time  3    Period  Weeks    Status  New    Target Date  12/15/17      PT SHORT TERM GOAL #2   Title  Pt will improve improve TUG score to  less than or equal to 15 seconds for decreased fall risk.    Baseline  TUG 19.03 seconds (Scores >13.5 seconds indicate increased fall risk.)    Time  3    Period  Weeks    Status  New    Target Date  12/15/17  PT SHORT TERM GOAL #3   Title  Pt will verbalize understanding of posture/positioning with ADLs for improved functional mobility and decreased pain.    Baseline  rates pain as 6-8/10 in shoulders, hip, back at eval; sits in forward head, rounded shoulders, posterior pelvic tilt posture    Time  3    Period  Weeks    Status  New    Target Date  12/15/17      PT SHORT TERM GOAL #4   Title  Pt will improve gait velocity score to at least 2.62 ft/sec for improved efficiency and safety with gait.    Baseline  Gait velocity 2.53 ft/sec at eval; scores 1.31-2.62 ft/sec indicate limited community ambulator    Time  3    Period  Weeks    Status  New    Target Date  12/15/17        PT Long Term Goals - 11/17/17 1639      PT LONG TERM GOAL #1   Title  Pt will verbalize understanding of fall prevention in home environment.  TARGET 02/09/18    Baseline  at fall risk per DGI and TUG scores    Time  12    Period  Weeks    Status  New    Target Date  02/09/18      PT LONG TERM GOAL #2   Title  Pt will improve DGI score to at least 19/24 for decreased fall risk.    Baseline  DGI 16/24 (scores <19/24 indicate increased fall risk)    Time  12    Period  Weeks    Status  New    Target Date  02/09/18      PT LONG TERM GOAL #3   Title  Pt will rate at least 2 point decrease in pain scale for shoulder, hip, back pain, with functional mobility activities.    Baseline  rates pain 6-8/10 at eval    Time  12    Period  Weeks    Status  New    Target Date  02/09/18      PT LONG TERM GOAL #4   Title  Pt will improve TUGs core to less than or equal to 13.5 seconds for decreased fall risk.    Baseline  TUG score 19.03 sec at eval    Time  12    Period  Weeks    Status  New     Target Date  02/09/18      PT LONG TERM GOAL #5   Title  Pt will verbalize plans for ongoing community fitness upon D/C from PT.    Baseline  no current HEP or community fitness routine    Time  12    Period  Weeks    Status  New    Target Date  02/09/18         Plan - 11/24/17 1535    Clinical Impression Statement  Today's skilled session focused on establishment of an HEP for home to address flexibility, strengthening and balance. No complaints with performance in session today with pain 6/10 after session. Pt should benefit from continued PT to progress toward unmet goals.    Rehab Potential  Good    PT Frequency  Other (comment) 1x/wk for 3 weeks, then 2x/wk for 4 weeks    PT Duration  Other (comment) over 12 week period    PT Treatment/Interventions  ADLs/Self Care Home Management;Electrical Stimulation;Traction;Gait training;Functional  mobility training;Ultrasound;Therapeutic activities;Therapeutic exercise;Balance training;DME Instruction;Neuromuscular re-education;Stair training;Patient/family education    PT Next Visit Plan  gait training with device for improved staiblity with gait, continue to address mm tightness with stretching, gentle core/LE strengthening, balance activities as back/hip pain permit    Consulted and Agree with Plan of Care  Patient       Patient will benefit from skilled therapeutic intervention in order to improve the following deficits and impairments:  Abnormal gait, Decreased activity tolerance, Decreased balance, Decreased mobility, Decreased knowledge of use of DME, Decreased strength, Difficulty walking, Postural dysfunction, Pain  Visit Diagnosis: Other abnormalities of gait and mobility  Unsteadiness on feet  Abnormal posture  Muscle weakness (generalized)     Problem List Patient Active Problem List   Diagnosis Date Noted  . B12 deficiency 08/10/2017  . Persistent headaches 08/09/2017  . GERD (gastroesophageal reflux disease)  06/29/2017  . Left arm numbness   . Cerebral embolism with cerebral infarction 06/17/2017  . TIA (transient ischemic attack) 06/17/2017  . Chronic back pain 12/29/2016  . Depression 12/15/2016  . Vitamin D deficiency 12/09/2016  . Right hip pain 12/07/2016  . Hypokalemia 12/07/2016  . Type 2 diabetes mellitus with vascular disease ([facility]) 05/31/2016  . Normocytic normochromic anemia 05/31/2016  . Chest pain 05/31/2016  . CAD (coronary artery disease) 01/06/2016  . DVT (deep venous thrombosis) ([place] [facility]) 01/06/2016  . Lactic acidosis 05/28/2014  . Nonspecific chest pain 01/29/2014  . Uncontrolled secondary diabetes with peripheral neuropathy ([place]) 01/29/2014  . Obesity, Class III, BMI 40-49.9 (morbid obesity) ([place]) 01/29/2014  . Snoring 01/29/2014  . Dyslipidemia 01/29/2014  . HTN (hypertension) 01/29/2014  . Abnormal nuclear stress test 01/29/2014    Willow Ora, PTA, [facility] [address] [place], [place] [address] [phone] 11/25/17, 6:21 PM   Name: [name] MRN: [id number] Date of Birth: 20-Sep-1961

## 2017-12-01 ENCOUNTER — Ambulatory Visit: Payer: Medicaid Other | Admitting: Physical Therapy

## 2017-12-01 ENCOUNTER — Encounter: Payer: Self-pay | Admitting: Physical Therapy

## 2017-12-01 DIAGNOSIS — R293 Abnormal posture: Secondary | ICD-10-CM

## 2017-12-01 DIAGNOSIS — M545 Low back pain, unspecified: Secondary | ICD-10-CM

## 2017-12-01 DIAGNOSIS — R2689 Other abnormalities of gait and mobility: Secondary | ICD-10-CM

## 2017-12-01 DIAGNOSIS — G8929 Other chronic pain: Secondary | ICD-10-CM

## 2017-12-01 DIAGNOSIS — M6281 Muscle weakness (generalized): Secondary | ICD-10-CM

## 2017-12-01 DIAGNOSIS — R2681 Unsteadiness on feet: Secondary | ICD-10-CM

## 2017-12-01 NOTE — Therapy (Signed)
[facility] [address] [place], [place], [address] Phone: [phone]   Fax:  [phone]  Physical Therapy Treatment  Patient Details  Name: [name] MRN: [id number] Date of Birth: 03-10-1961 Referring Provider: Marcial Pacas   Encounter Date: 12/01/2017  PT End of Session - 12/01/17 1540    Visit Number  3    Number of Visits  12 3 + 8 + eval    Date for PT Re-Evaluation  02/09/18    Authorization Type  Medicaid    Authorization - Visit Number  2    Authorization - Number of Visits  3    PT Start Time  1535    PT Stop Time  1614    PT Time Calculation (min)  39 min    Equipment Utilized During Treatment  Gait belt    Activity Tolerance  Patient tolerated treatment well    Behavior During Therapy  WFL for tasks assessed/performed       Past Medical History:  Diagnosis Date  . Arthritis   . Back pain   . CAD (coronary artery disease)    a. s/p DES to LAD in 05/2016  . DVT (deep venous thrombosis) ([facility])   . Hyperlipidemia   . Hypertension   . IBS (irritable bowel syndrome)   . OSA (obstructive sleep apnea)   . Pancreatitis   . PE (pulmonary thromboembolism) ([place])   . PUD (peptic ulcer disease)   . Renal disorder   . Stroke ([place])   . Type 2 diabetes mellitus ([place])     Past Surgical History:  Procedure Laterality Date  . CARDIAC CATHETERIZATION N/A 05/31/2016   Procedure: Left Heart Cath and Coronary Angiography;  Surgeon: Peter M Martinique, MD;  Location: [facility];  Service: Cardiovascular;  Laterality: N/A;  . CARDIAC CATHETERIZATION N/A 05/31/2016   Procedure: Intravascular Pressure Wire/FFR Study;  Surgeon: Peter M Martinique, MD;  Location: [facility];  Service: Cardiovascular;  Laterality: N/A;  . CARDIAC CATHETERIZATION N/A 05/31/2016   Procedure: Coronary Stent Intervention;  Surgeon: Peter M Martinique, MD;  Location: [facility];  Service: Cardiovascular;  Laterality: N/A;  .  LEFT HEART CATHETERIZATION WITH CORONARY ANGIOGRAM N/A 02/03/2014   Procedure: LEFT HEART CATHETERIZATION WITH CORONARY ANGIOGRAM;  Surgeon: Pixie Casino, MD;  Location: [facility];  Service: Cardiovascular;  Laterality: N/A;  . left leg stent       There were no vitals filed for this visit.  Subjective Assessment - 12/01/17 1537    Subjective  Having a rough day due to had some chest pain that he took his nitroglycerin tablet for and that caused headache. HEP is going well, not flaring up pain too much. Has been consistent with doing a little of everything issued for up to 30 mintues at a time.     Pertinent History  PMH includes CVA (last one December 2018-per patient report), arthritis R hip, DVT LLE, spinal stenosis C4-5, C5-6 with disc protrusion/no cord signal change; multi-level degenerative changes and spinal stenosis    Patient Stated Goals  Pt's goals for PT are to walk normal, without pain.    Currently in Pain?  Yes    Pain Location  Head    Pain Descriptors / Indicators  Headache    Pain Type  Acute pain    Pain Onset  Today    Pain Frequency  Occasional    Aggravating Factors   nitroglycerin tablets  Pain Relieving Factors  time, other medications he takes         [facility] PT Assessment - 12/01/17 1544      Timed Up and Go Test   TUG  Normal TUG    Normal TUG (seconds)  14.41 no AD    TUG Comments  Scores>13.5 seconds indicate increased fall risk.           [facility] Adult PT Treatment/Exercise - 12/01/17 1544      Ambulation/Gait   Ambulation/Gait  Yes    Ambulation/Gait Assistance  5: Supervision    Ambulation Distance (Feet)  100 Feet x2, plus around gym for testing/activities    Assistive device  None    Gait Pattern  Step-through pattern;Lateral trunk lean to left;Antalgic;Trendelenburg;Wide base of support    Ambulation Surface  Level;Indoor    Gait velocity  11.72 sec's= 2.80 ft/sec      High Level Balance   High Level Balance Activities  Side  stepping;Tandem walking;Marching forwards;Marching backwards tandem/toe walk fwd/bwd    High Level Balance Comments  at counter top with no to single UE support: on floor x2 laps each, then on blue mat x 2 laps each. min guard to min assist for balance.       Exercises   Other Exercises   seated in chair with pilllow to lower back: upper trunk extension to back of chair with arms up/out to side for chest stretch as well. 5 sec holds x 10 reps. standing with back against door frame reaching up to top of doorframe then sliding hands back for upper back/chest stretch, 5 sec holds x 5 reps.           Balance Exercises - 12/01/17 1549      Balance Exercises: Standing   Standing Eyes Closed  Wide (BOA);Foam/compliant surface;Other reps (comment);30 secs;Limitations    Balance Beam  standign across red beam: alternaitng fwd heel taps, then alternating bwd toe taps x5 reps each with min to mod assist for balance and intermittent./light UE support on bars.       Balance Exercises: Standing   Standing Eyes Closed Limitations  on airex in corner with chair in front for safety: EC no head movements, progressing to EC head movements left<>right, up<>down and diagonals both ways. min guard to occasional min assit for balance. cues on posture, weight shifting and stance position for balance.           PT Short Term Goals - 11/17/17 1634      PT SHORT TERM GOAL #1   Title  Pt will be independent with HEP to address back pain, balance, and gait.  TARGET 12/15/17    Baseline  No current HEP    Time  3    Period  Weeks    Status  New    Target Date  12/15/17      PT SHORT TERM GOAL #2   Title  Pt will improve improve TUG score to less than or equal to 15 seconds for decreased fall risk.    Baseline  TUG 19.03 seconds (Scores >13.5 seconds indicate increased fall risk.)    Time  3    Period  Weeks    Status  New    Target Date  12/15/17      PT SHORT TERM GOAL #3   Title  Pt will verbalize  understanding of posture/positioning with ADLs for improved functional mobility and decreased pain.    Baseline  rates pain  as 6-8/10 in shoulders, hip, back at eval; sits in forward head, rounded shoulders, posterior pelvic tilt posture    Time  3    Period  Weeks    Status  New    Target Date  12/15/17      PT SHORT TERM GOAL #4   Title  Pt will improve gait velocity score to at least 2.62 ft/sec for improved efficiency and safety with gait.    Baseline  Gait velocity 2.53 ft/sec at eval; scores 1.31-2.62 ft/sec indicate limited community ambulator    Time  3    Period  Weeks    Status  New    Target Date  12/15/17        PT Long Term Goals - 11/17/17 1639      PT LONG TERM GOAL #1   Title  Pt will verbalize understanding of fall prevention in home environment.  TARGET 02/09/18    Baseline  at fall risk per DGI and TUG scores    Time  12    Period  Weeks    Status  New    Target Date  02/09/18      PT LONG TERM GOAL #2   Title  Pt will improve DGI score to at least 19/24 for decreased fall risk.    Baseline  DGI 16/24 (scores <19/24 indicate increased fall risk)    Time  12    Period  Weeks    Status  New    Target Date  02/09/18      PT LONG TERM GOAL #3   Title  Pt will rate at least 2 point decrease in pain scale for shoulder, hip, back pain, with functional mobility activities.    Baseline  rates pain 6-8/10 at eval    Time  12    Period  Weeks    Status  New    Target Date  02/09/18      PT LONG TERM GOAL #4   Title  Pt will improve TUGs core to less than or equal to 13.5 seconds for decreased fall risk.    Baseline  TUG score 19.03 sec at eval    Time  12    Period  Weeks    Status  New    Target Date  02/09/18      PT LONG TERM GOAL #5   Title  Pt will verbalize plans for ongoing community fitness upon D/C from PT.    Baseline  no current HEP or community fitness routine    Time  12    Period  Weeks    Status  New    Target Date  02/09/18             Plan - 12/01/17 1541    Clinical Impression Statement  Today's skilled session continued to address postural re-education and high level balance activities with pt continuing to fatigue and need short rest breaks. Pt did demo improvement on timed up and go test and 10 meter gait speed test today. Will request additional visits from Medicaid. Pt is progressing toward goals and should benefit from continued PT to progress toward unmet goals.    Rehab Potential  Good    PT Frequency  Other (comment) 1x/wk for 3 weeks, then 2x/wk for 4 weeks    PT Duration  Other (comment) over 12 week period    PT Treatment/Interventions  ADLs/Self Care Home Management;Electrical Stimulation;Traction;Gait training;Functional mobility training;Ultrasound;Therapeutic activities;Therapeutic exercise;Balance training;DME Instruction;Neuromuscular  re-education;Stair training;Patient/family education    PT Next Visit Plan  continue to work on postural stretching, LE stretching, balance and exercises. add more visits as they are approved vs transition to [facility] (they have already been in contact with pt)    Consulted and Agree with Plan of Care  Patient       Patient will benefit from skilled therapeutic intervention in order to improve the following deficits and impairments:  Abnormal gait, Decreased activity tolerance, Decreased balance, Decreased mobility, Decreased knowledge of use of DME, Decreased strength, Difficulty walking, Postural dysfunction, Pain, Decreased knowledge of precautions  Visit Diagnosis: Other abnormalities of gait and mobility  Unsteadiness on feet  Abnormal posture  Muscle weakness (generalized)  Chronic right-sided low back pain without sciatica     Problem List Patient Active Problem List   Diagnosis Date Noted  . B12 deficiency 08/10/2017  . Persistent headaches 08/09/2017  . GERD (gastroesophageal reflux disease) 06/29/2017  . Left  arm numbness   . Cerebral embolism with cerebral infarction 06/17/2017  . TIA (transient ischemic attack) 06/17/2017  . Chronic back pain 12/29/2016  . Depression 12/15/2016  . Vitamin D deficiency 12/09/2016  . Right hip pain 12/07/2016  . Hypokalemia 12/07/2016  . Type 2 diabetes mellitus with vascular disease ([place] [place]) 05/31/2016  . Normocytic normochromic anemia 05/31/2016  . Chest pain 05/31/2016  . CAD (coronary artery disease) 01/06/2016  . DVT (deep venous thrombosis) ([place]) 01/06/2016  . Lactic acidosis 05/28/2014  . Nonspecific chest pain 01/29/2014  . Uncontrolled secondary diabetes with peripheral neuropathy ([place]) 01/29/2014  . Obesity, Class III, BMI 40-49.9 (morbid obesity) ([place] [facility]) 01/29/2014  . Snoring 01/29/2014  . Dyslipidemia 01/29/2014  . HTN (hypertension) 01/29/2014  . Abnormal nuclear stress test 01/29/2014   Willow Ora, PTA, [facility] [address] [place], [place] [address] [phone] 12/01/17, 11:29 PM   Name: [name] MRN: [id number] Date of Birth: 1961/05/03

## 2017-12-07 ENCOUNTER — Observation Stay (HOSPITAL_COMMUNITY)
Admission: EM | Admit: 2017-12-07 | Discharge: 2017-12-09 | Disposition: A | Discharge status: Home / Self Care | Payer: Medicaid Other | Attending: Family Medicine | Admitting: Family Medicine

## 2017-12-07 ENCOUNTER — Encounter (HOSPITAL_COMMUNITY): Payer: Self-pay | Admitting: Emergency Medicine

## 2017-12-07 ENCOUNTER — Ambulatory Visit (HOSPITAL_BASED_OUTPATIENT_CLINIC_OR_DEPARTMENT_OTHER)
Admit: 2017-12-07 | Discharge: 2017-12-07 | Disposition: A | Discharge status: Home / Self Care | Payer: Medicaid Other | Attending: Emergency Medicine | Admitting: Emergency Medicine

## 2017-12-07 ENCOUNTER — Other Ambulatory Visit: Payer: Self-pay

## 2017-12-07 ENCOUNTER — Emergency Department (HOSPITAL_COMMUNITY): Payer: Medicaid Other

## 2017-12-07 DIAGNOSIS — R609 Edema, unspecified: Secondary | ICD-10-CM

## 2017-12-07 DIAGNOSIS — Z794 Long term (current) use of insulin: Secondary | ICD-10-CM | POA: Insufficient documentation

## 2017-12-07 DIAGNOSIS — R079 Chest pain, unspecified: Principal | ICD-10-CM | POA: Diagnosis present

## 2017-12-07 DIAGNOSIS — E876 Hypokalemia: Secondary | ICD-10-CM | POA: Diagnosis not present

## 2017-12-07 DIAGNOSIS — F32A Depression, unspecified: Secondary | ICD-10-CM | POA: Diagnosis present

## 2017-12-07 DIAGNOSIS — R0600 Dyspnea, unspecified: Secondary | ICD-10-CM | POA: Insufficient documentation

## 2017-12-07 DIAGNOSIS — E1165 Type 2 diabetes mellitus with hyperglycemia: Secondary | ICD-10-CM | POA: Diagnosis not present

## 2017-12-07 DIAGNOSIS — I5032 Chronic diastolic (congestive) heart failure: Secondary | ICD-10-CM | POA: Insufficient documentation

## 2017-12-07 DIAGNOSIS — I2510 Atherosclerotic heart disease of native coronary artery without angina pectoris: Secondary | ICD-10-CM | POA: Diagnosis not present

## 2017-12-07 DIAGNOSIS — E1142 Type 2 diabetes mellitus with diabetic polyneuropathy: Secondary | ICD-10-CM | POA: Diagnosis not present

## 2017-12-07 DIAGNOSIS — Z7902 Long term (current) use of antithrombotics/antiplatelets: Secondary | ICD-10-CM | POA: Diagnosis not present

## 2017-12-07 DIAGNOSIS — E1342 Other specified diabetes mellitus with diabetic polyneuropathy: Secondary | ICD-10-CM | POA: Diagnosis present

## 2017-12-07 DIAGNOSIS — I200 Unstable angina: Secondary | ICD-10-CM

## 2017-12-07 DIAGNOSIS — R0789 Other chest pain: Secondary | ICD-10-CM

## 2017-12-07 DIAGNOSIS — I6340 Cerebral infarction due to embolism of unspecified cerebral artery: Secondary | ICD-10-CM | POA: Diagnosis present

## 2017-12-07 DIAGNOSIS — Z8673 Personal history of transient ischemic attack (TIA), and cerebral infarction without residual deficits: Secondary | ICD-10-CM | POA: Diagnosis present

## 2017-12-07 DIAGNOSIS — D649 Anemia, unspecified: Secondary | ICD-10-CM | POA: Insufficient documentation

## 2017-12-07 DIAGNOSIS — Z86718 Personal history of other venous thrombosis and embolism: Secondary | ICD-10-CM | POA: Diagnosis not present

## 2017-12-07 DIAGNOSIS — Z955 Presence of coronary angioplasty implant and graft: Secondary | ICD-10-CM | POA: Diagnosis not present

## 2017-12-07 DIAGNOSIS — F329 Major depressive disorder, single episode, unspecified: Secondary | ICD-10-CM | POA: Diagnosis not present

## 2017-12-07 DIAGNOSIS — IMO0002 Reserved for concepts with insufficient information to code with codable children: Secondary | ICD-10-CM | POA: Diagnosis present

## 2017-12-07 DIAGNOSIS — G8929 Other chronic pain: Secondary | ICD-10-CM | POA: Diagnosis not present

## 2017-12-07 DIAGNOSIS — E66813 Obesity, class 3: Secondary | ICD-10-CM | POA: Diagnosis present

## 2017-12-07 DIAGNOSIS — Z79891 Long term (current) use of opiate analgesic: Secondary | ICD-10-CM | POA: Diagnosis not present

## 2017-12-07 DIAGNOSIS — G4733 Obstructive sleep apnea (adult) (pediatric): Secondary | ICD-10-CM | POA: Diagnosis not present

## 2017-12-07 DIAGNOSIS — R072 Precordial pain: Secondary | ICD-10-CM

## 2017-12-07 DIAGNOSIS — Z6841 Body Mass Index (BMI) 40.0 and over, adult: Secondary | ICD-10-CM | POA: Diagnosis not present

## 2017-12-07 DIAGNOSIS — Z79899 Other long term (current) drug therapy: Secondary | ICD-10-CM | POA: Insufficient documentation

## 2017-12-07 DIAGNOSIS — E785 Hyperlipidemia, unspecified: Secondary | ICD-10-CM | POA: Diagnosis not present

## 2017-12-07 DIAGNOSIS — E1365 Other specified diabetes mellitus with hyperglycemia: Secondary | ICD-10-CM | POA: Diagnosis present

## 2017-12-07 DIAGNOSIS — Z7982 Long term (current) use of aspirin: Secondary | ICD-10-CM | POA: Diagnosis not present

## 2017-12-07 DIAGNOSIS — Z86711 Personal history of pulmonary embolism: Secondary | ICD-10-CM | POA: Diagnosis not present

## 2017-12-07 DIAGNOSIS — I82409 Acute embolism and thrombosis of unspecified deep veins of unspecified lower extremity: Secondary | ICD-10-CM | POA: Diagnosis present

## 2017-12-07 DIAGNOSIS — E1159 Type 2 diabetes mellitus with other circulatory complications: Secondary | ICD-10-CM | POA: Diagnosis not present

## 2017-12-07 DIAGNOSIS — I10 Essential (primary) hypertension: Secondary | ICD-10-CM | POA: Diagnosis not present

## 2017-12-07 DIAGNOSIS — E119 Type 2 diabetes mellitus without complications: Secondary | ICD-10-CM | POA: Diagnosis present

## 2017-12-07 DIAGNOSIS — I110 Hypertensive heart disease with heart failure: Secondary | ICD-10-CM | POA: Diagnosis not present

## 2017-12-07 HISTORY — DX: Major depressive disorder, single episode, unspecified: F32.9

## 2017-12-07 HISTORY — DX: Thoracic aortic ectasia: I77.810

## 2017-12-07 HISTORY — DX: Hematemesis: K92.0

## 2017-12-07 HISTORY — DX: Other chronic pain: G89.29

## 2017-12-07 HISTORY — DX: Chronic diastolic (congestive) heart failure: I50.32

## 2017-12-07 HISTORY — DX: Radiculopathy, cervical region: M54.12

## 2017-12-07 HISTORY — DX: Polyneuropathy, unspecified: G62.9

## 2017-12-07 HISTORY — DX: Depression, unspecified: F32.A

## 2017-12-07 HISTORY — DX: Fatty (change of) liver, not elsewhere classified: K76.0

## 2017-12-07 HISTORY — DX: Morbid (severe) obesity due to excess calories: E66.01

## 2017-12-07 LAB — BASIC METABOLIC PANEL
Anion gap: 14 (ref 5–15)
BUN: 10 mg/dL (ref 6–20)
CO2: 25 mmol/L (ref 22–32)
Calcium: 8.5 mg/dL — ABNORMAL LOW (ref 8.9–10.3)
Chloride: 100 mmol/L — ABNORMAL LOW (ref 101–111)
Creatinine, Ser: 0.72 mg/dL (ref 0.61–1.24)
GFR calc Af Amer: >60 mL/min (ref >60)
GFR calc non Af Amer: >60 mL/min (ref >60)
Glucose, Bld: 220 mg/dL — ABNORMAL HIGH (ref 65–99)
Potassium: 2.8 mmol/L — ABNORMAL LOW (ref 3.5–5.1)
Sodium: 139 mmol/L (ref 135–145)

## 2017-12-07 LAB — CBC
HCT: 36.5 % — ABNORMAL LOW (ref 39.0–52.0)
Hemoglobin: 11.8 g/dL — ABNORMAL LOW (ref 13.0–17.0)
MCH: 28.9 pg (ref 26.0–34.0)
MCHC: 32.3 g/dL (ref 30.0–36.0)
MCV: 89.5 fL (ref 78.0–100.0)
Platelets: 230 10*3/uL (ref 150–400)
RBC: 4.08 MIL/uL — ABNORMAL LOW (ref 4.22–5.81)
RDW: 14 % (ref 11.5–15.5)
WBC: 8.1 10*3/uL (ref 4.0–10.5)

## 2017-12-07 LAB — RAPID URINE DRUG SCREEN, HOSP PERFORMED
Amphetamines: NOT DETECTED
Barbiturates: NOT DETECTED
Benzodiazepines: NOT DETECTED
Cocaine: NOT DETECTED
Opiates: NOT DETECTED
Tetrahydrocannabinol: NOT DETECTED

## 2017-12-07 LAB — I-STAT TROPONIN, ED: Troponin i, poc: 0 ng/mL (ref 0.00–0.08)

## 2017-12-07 LAB — TROPONIN I
Troponin I: <0.03 ng/mL (ref <0.03)
Troponin I: <0.03 ng/mL (ref <0.03)

## 2017-12-07 LAB — BRAIN NATRIURETIC PEPTIDE: B Natriuretic Peptide: 23.6 pg/mL (ref 0.0–100.0)

## 2017-12-07 LAB — D-DIMER, QUANTITATIVE: D-Dimer, Quant: 0.47 ug/mL-FEU (ref 0.00–0.50)

## 2017-12-07 MED ORDER — ASPIRIN 81 MG PO CHEW
81.0000 mg | CHEWABLE_TABLET | Freq: Every day | ORAL | Status: DC
  Administered 2017-12-09: 81 mg via ORAL
  Filled 2017-12-07: qty 1

## 2017-12-07 MED ORDER — ATORVASTATIN CALCIUM 40 MG PO TABS
80.0000 mg | ORAL_TABLET | Freq: Every day | ORAL | Status: DC
  Administered 2017-12-09: 80 mg via ORAL
  Filled 2017-12-07: qty 2

## 2017-12-07 MED ORDER — HYDRALAZINE HCL 50 MG PO TABS
50.0000 mg | ORAL_TABLET | Freq: Three times a day (TID) | ORAL | Status: DC
  Administered 2017-12-07 – 2017-12-09 (×3): 50 mg via ORAL
  Filled 2017-12-07 (×3): qty 1

## 2017-12-07 MED ORDER — ASPIRIN 81 MG PO CHEW
324.0000 mg | CHEWABLE_TABLET | Freq: Once | ORAL | Status: AC
  Administered 2017-12-07: 324 mg via ORAL
  Filled 2017-12-07: qty 4

## 2017-12-07 MED ORDER — NITROGLYCERIN 2 % TD OINT
1.0000 [in_us] | TOPICAL_OINTMENT | Freq: Once | TRANSDERMAL | Status: AC
  Administered 2017-12-07: 1 [in_us] via TOPICAL
  Filled 2017-12-07: qty 1

## 2017-12-07 MED ORDER — CLOPIDOGREL BISULFATE 75 MG PO TABS
75.0000 mg | ORAL_TABLET | Freq: Every day | ORAL | Status: DC
  Administered 2017-12-09: 75 mg via ORAL
  Filled 2017-12-07: qty 1

## 2017-12-07 MED ORDER — INSULIN DETEMIR 100 UNIT/ML ~~LOC~~ SOLN
20.0000 [IU] | Freq: Every day | SUBCUTANEOUS | Status: DC
  Administered 2017-12-07 – 2017-12-08 (×2): 20 [IU] via SUBCUTANEOUS
  Filled 2017-12-07 (×3): qty 0.2

## 2017-12-07 MED ORDER — DULOXETINE HCL 60 MG PO CPEP
60.0000 mg | ORAL_CAPSULE | Freq: Every day | ORAL | Status: DC
  Administered 2017-12-07 – 2017-12-09 (×2): 60 mg via ORAL
  Filled 2017-12-07: qty 2
  Filled 2017-12-07: qty 1

## 2017-12-07 MED ORDER — CARVEDILOL 25 MG PO TABS
25.0000 mg | ORAL_TABLET | Freq: Two times a day (BID) | ORAL | Status: DC
  Administered 2017-12-07 – 2017-12-09 (×4): 25 mg via ORAL
  Filled 2017-12-07 (×4): qty 1

## 2017-12-07 MED ORDER — ISOSORBIDE DINITRATE 20 MG PO TABS
30.0000 mg | ORAL_TABLET | Freq: Every morning | ORAL | Status: DC
  Administered 2017-12-09: 30 mg via ORAL
  Filled 2017-12-07 (×2): qty 1

## 2017-12-07 MED ORDER — FAMOTIDINE 20 MG PO TABS
40.0000 mg | ORAL_TABLET | Freq: Every day | ORAL | Status: DC
  Administered 2017-12-07 – 2017-12-08 (×2): 40 mg via ORAL
  Filled 2017-12-07 (×2): qty 2

## 2017-12-07 MED ORDER — MORPHINE SULFATE (PF) 2 MG/ML IV SOLN: 2.0000 mg | INTRAVENOUS | Status: DC | PRN

## 2017-12-07 MED ORDER — NITROGLYCERIN 2 % TD OINT
1.0000 [in_us] | TOPICAL_OINTMENT | Freq: Once | TRANSDERMAL | Status: AC
  Administered 2017-12-07: 1 [in_us] via TOPICAL
  Filled 2017-12-07: qty 30

## 2017-12-07 MED ORDER — MORPHINE SULFATE (PF) 4 MG/ML IV SOLN
2.0000 mg | INTRAVENOUS | Status: DC | PRN
  Administered 2017-12-08 – 2017-12-09 (×2): 2 mg via INTRAVENOUS
  Filled 2017-12-07 (×2): qty 1

## 2017-12-07 MED ORDER — ACETAMINOPHEN 325 MG PO TABS: 650.0000 mg | ORAL_TABLET | ORAL | Status: DC | PRN

## 2017-12-07 MED ORDER — METOPROLOL TARTRATE 25 MG PO TABS
12.5000 mg | ORAL_TABLET | Freq: Two times a day (BID) | ORAL | Status: DC
  Administered 2017-12-07: 12.5 mg via ORAL
  Filled 2017-12-07: qty 1

## 2017-12-07 MED ORDER — ONDANSETRON HCL 4 MG/2ML IJ SOLN
4.0000 mg | Freq: Four times a day (QID) | INTRAMUSCULAR | Status: DC | PRN
  Administered 2017-12-08: 4 mg via INTRAVENOUS
  Filled 2017-12-07: qty 2

## 2017-12-07 MED ORDER — POTASSIUM CHLORIDE CRYS ER 20 MEQ PO TBCR
40.0000 meq | EXTENDED_RELEASE_TABLET | Freq: Once | ORAL | Status: AC
  Administered 2017-12-07: 40 meq via ORAL
  Filled 2017-12-07: qty 2

## 2017-12-07 MED ORDER — GI COCKTAIL ~~LOC~~: 30.0000 mL | Freq: Four times a day (QID) | ORAL | Status: DC | PRN

## 2017-12-07 NOTE — H&P (View-Only) (Signed)
Cardiology Consultation:   Patient ID: [name]; [id number]; [birth year]   Admit date: 12/07/2017 Date of Consult: 12/07/2017  Primary Care Provider: Rogers Blocker, MD Primary Cardiologist: Dr. Stanford Stein previously, pt transitioned care to [facility] closer to home  Chief Complaint: chest pain, nose bleeds, metal taste in mouth  Patient Profile:   [name] is a 57 y.o. male with a hx of PUD 2005, CAD (s/p DES to LAD 05/2016), frequent ED visits, morbid obesity, cervical radiculopathy, HTN, HLD, DM2, peripheral neuropathy/chronic pain, OSA, DVT ?2016-2017 in [place] (on Xarelto for several months per pt report), reported hx of stroke (not seen on imaging during adm for TIA symptoms 05/2017 felt more likely due to cervical radiculopathy), chronic dCHF, multifactorial/B12 deficient anemia, depression, 4.3cm thoracic aorta by CT 06/2017 who is being seen today for the evaluation of chest pain at the request of Dr. Leonette Stein.  History of Present Illness:   [name] has history of numerous encounters for atypical chest pain amongst other varying complaints (total of 15 ER visits in 2018, 5 in the last 6 months). Last cath was in 05/2016 with 70% LAD with abnormal FFR 0.67, residual 25% mRCA, elevated LVEDP. Last echo was in 06/2017 with mild LVH, EF 50-55%, grade 1 DD.  He presented to the ER with 10 days worth of intermittent chest pain, about once a day. Some of these episodes have occurred while watching TV; others have occurred during exertion including during sex. He has chronic dyspnea which is unchanged. Episodes last about 15 minutes, but are able to be cut short if he takes a SL NTG. He estimates he's taken about 10 over the last 10 days. He is able to make the pain worse by lifting or rotating his left arm. It feels like prior angina as well as prior atypical CP. BNP normal and troponin is negative, d-dimer negative, hemoglobin is 11.8 (baseline 11.2-12), K 2.8 and  glucose 220. EKG shows NSR with lateral TW changes - new TWI I, aVL, chronic in V5-V6. He is not tachycardic, tachypneic or hypoxic but is hypertensive up to 178/79. He reports frequent hx of LEE. LE duplex for DVT.  He reports spotty compliance with CPAP. When asked if he takes his medication regularly, he admits he occasionally misses doses if he has a doctor's appointment. This was also mentioned during his OV 06/2017 when his BP was high.   Past Medical History:  Diagnosis Date  . Arthritis   . Back pain   . CAD (coronary artery disease)    a. s/p DES to LAD in 05/2016  . Cervical radiculopathy   . Chronic diastolic CHF (congestive heart failure) ([place])   . Chronic pain   . Depression   . DVT (deep venous thrombosis) ([place])   . Hematemesis   . Hepatic steatosis   . Hyperlipidemia   . Hypertension   . IBS (irritable bowel syndrome)   . Morbid obesity ([place])   . OSA (obstructive sleep apnea)   . Pancreatitis   . PE (pulmonary thromboembolism) ([place])   . Peripheral neuropathy   . PUD (peptic ulcer disease)   . Renal disorder   . Stroke [facility])    a. ?details unclear - not seen on imaging when he was admitted in 05/2017 for TIA symptoms which were felt due to cervical radiculopathy.  . Thoracic aortic ectasia ([facility])    a. 4.3cm ectatic ascending thoracic aorta by CT 06/2017.   Marland Kitchen Type  2 diabetes mellitus ([place])     Past Surgical History:  Procedure Laterality Date  . CARDIAC CATHETERIZATION N/A 05/31/2016   Procedure: Left Heart Cath and Coronary Angiography;  Surgeon: [name] Martinique, MD;  Location: [place] [place] CV LAB;  Service: Cardiovascular;  Laterality: N/A;  . CARDIAC CATHETERIZATION N/A 05/31/2016   Procedure: Intravascular Pressure Wire/FFR Study;  Surgeon: [name] Martinique, MD;  Location: [place] CV LAB;  Service: Cardiovascular;  Laterality: N/A;  . CARDIAC CATHETERIZATION N/A 05/31/2016   Procedure: Coronary Stent Intervention;  Surgeon: [name] Martinique, MD;  Location: [place]  CV LAB;  Service: Cardiovascular;  Laterality: N/A;  . LEFT HEART CATHETERIZATION WITH CORONARY ANGIOGRAM N/A 02/03/2014   Procedure: LEFT HEART CATHETERIZATION WITH CORONARY ANGIOGRAM;  Surgeon: Juan Casino, MD;  Location[facility] CATH LAB;  Service: Cardiovascular;  Laterality: N/A;  . left leg stent        Inpatient Medications: Scheduled Meds:  Continuous Infusions:  PRN Meds:   Home Meds: Prior to Admission medications   Medication Sig Start Date End Date Taking? Authorizing Provider  aspirin 81 MG chewable tablet Chew 1 tablet (81 mg total) by mouth daily. 06/01/16  Yes Juan Mocha, MD  atorvastatin (LIPITOR) 80 MG tablet Take 1 tablet (80 mg total) by mouth daily. 01/26/17  Yes [name], Juan Hertz, PA-C  carvedilol (COREG) 25 MG tablet Take 1 tablet (25 mg total) by mouth 2 (two) times daily with a meal. 01/26/17  Yes [name], Juan M, PA-C  clopidogrel (PLAVIX) 75 MG tablet Take 75 mg by mouth daily.   Yes [provider]  DULoxetine (CYMBALTA) 60 MG capsule Take 1 capsule (60 mg total) by mouth daily. 11/06/17  Yes Juan Pacas, MD  famotidine (PEPCID) 40 MG tablet Take 1 tablet (40 mg total) by mouth at bedtime. Patient taking differently: Take 40 mg by mouth daily.  06/18/17  Yes Juan Dubois, MD  furosemide (LASIX) 40 MG tablet Take 1 tablet (40 mg total) by mouth 2 (two) times daily. 01/26/17  Yes [name], Juan M, PA-C  glipiZIDE (GLUCOTROL) 10 MG tablet Take 10 mg by mouth 2 (two) times daily. 05/30/17  Yes [provider]  hydrALAZINE (APRESOLINE) 50 MG tablet Take 1 tablet (50 mg total) by mouth 3 (three) times daily. 07/11/17  Yes [name], Juan Croon, PA-C  Insulin Detemir (LEVEMIR) 100 UNIT/ML Pen Inject 20 Units into the skin daily at 10 pm. Patient taking differently: Inject 50 Units daily at 10 pm into the skin.  06/18/17  Yes Juan Dubois, MD  isosorbide dinitrate (ISORDIL) 30 MG tablet Take 1 tablet (30 mg total) by mouth 2 (two) times  daily. Patient taking differently: Take 30 mg by mouth every morning.  11/22/16  Yes [name], Juan M, PA-C  LYRICA 100 MG capsule Take 100 mg by mouth 2 (two) times daily. 11/29/17  Yes [provider]  metFORMIN (GLUCOPHAGE-XR) 500 MG 24 hr tablet Take 2 tablets (1,000 mg total) by mouth 2 (two) times daily. 07/02/17  Yes Juan Dubois, MD  nitroGLYCERIN (NITROSTAT) 0.4 MG SL tablet Place 0.4 mg under the tongue every 5 (five) minutes as needed for chest pain. 12/17/16  Yes [provider]  ondansetron (ZOFRAN) 4 MG tablet Take 1 tablet (4 mg total) every 8 (eight) hours as needed by mouth for nausea or vomiting. 09/02/17  Yes Harris, Abigail, PA-C  oxyCODONE (ROXICODONE) 15 MG immediate release tablet Take 15 mg 3 (three) times daily as needed by mouth for  pain.  08/28/17  Yes [provider]  pantoprazole (PROTONIX) 40 MG tablet Take 1 tablet (40 mg total) by mouth 2 (two) times daily. 06/18/17  Yes Juan Dubois, MD  potassium chloride SA (K-DUR,KLOR-CON) 20 MEQ tablet Take 20 mEq by mouth daily.   Yes [provider]  propranolol ER (INDERAL LA) 80 MG 24 hr capsule Take 1 capsule (80 mg total) by mouth at bedtime. 08/09/17  Yes Juan Pacas, MD  sertraline (ZOLOFT) 50 MG tablet Take 50 mg by mouth daily.   Yes [provider]  sitaGLIPtin (JANUVIA) 100 MG tablet Take 100 mg by mouth daily.   Yes [provider]  tiZANidine (ZANAFLEX) 2 MG tablet Take 2 mg by mouth every 6 (six) hours as needed for muscle spasms.   Yes [provider]  traZODone (DESYREL) 50 MG tablet Take 1 tablet (50 mg total) by mouth at bedtime. 12/15/16  Yes Burns, Arloa Koh, MD  Vitamin D, Ergocalciferol, (DRISDOL) 50000 units CAPS capsule Take 1 capsule by mouth every Monday. 11/06/17  Yes [provider]  Insulin Pen Needle 31G X 5 MM MISC Use 1 needle daily to inject insulin as prescribed 06/18/17   Juan Dubois, MD  ondansetron (ZOFRAN ODT) 4 MG  disintegrating tablet Take 1 tablet (4 mg total) by mouth every 8 (eight) hours as needed for nausea or vomiting. Patient not taking: Reported on 12/07/2017 06/09/17   Varney Biles, MD    Allergies:    Allergies  Allergen Reactions  . Coconut Flavor [Flavoring Agent] Hives  . Coconut Oil Hives  . Ibuprofen Other (See Comments)    Made gastric ulcers worse  . Aleve [Naproxen]     DUE TO KIDNEYS    Social History:   Social History   Socioeconomic History  . Marital status: Single    Spouse name: Not on file  . Number of children: 3  . Years of education: 8  . Highest education level: Not on file  Social Needs  . Financial resource strain: Not on file  . Food insecurity - worry: Not on file  . Food insecurity - inability: Not on file  . Transportation needs - medical: Not on file  . Transportation needs - non-medical: Not on file  Occupational History  . Occupation: Disabled  Tobacco Use  . Smoking status: Never Smoker  . Smokeless tobacco: Never Used  Substance and Sexual Activity  . Alcohol use: No  . Drug use: No  . Sexual activity: Not on file  Other Topics Concern  . Not on file  Social History Narrative   Independent and ambulatory with cane.   Lives at home alone.   Right-handed.   1-2 cups caffeine per day.    Family History:   The patient's family history includes Breast cancer in his mother; Cancer in his father; Diabetes in his brother, mother, and sister; Hypertension in his brother, mother, and sister.   ROS:  Please see the history of present illness.  all other ROS reviewed and negative.     Physical Exam/Data:   Vitals:   12/07/17 1300 12/07/17 1302 12/07/17 1330 12/07/17 1400  BP: (!) 161/87 (!) 161/87 (!) 187/97 (!) 159/98  Pulse: 71 78 67 70  Resp:  18 19 14   Temp:      TempSrc:      SpO2: 94% 97% 93% 97%   No intake or output data in the 24 hours ending 12/07/17 1524 There were no vitals filed for  this visit. There is no height or  weight on file to calculate BMI.  General: Well developed, well nourished morbidly obese AAM in no acute distress. Head: Normocephalic, atraumatic, sclera non-icteric, no xanthomas, nares are without discharge.  Neck: Negative for carotid bruits. JVD not elevated. Lungs: Clear bilaterally to auscultation without wheezes, rales, or rhonchi. Breathing is unlabored. Heart: RRR with S1 S2. No murmurs, rubs, or gallops appreciated. Abdomen: Soft, non-tender, non-distended with normoactive bowel sounds. No hepatomegaly. No rebound/guarding. No obvious abdominal masses. Msk:  Strength and tone appear normal for age. Extremities: No clubbing or cyanosis. Chronic appearing venous stasis changes with mild edema superimposed on baseline large leg habitus.  Distal pedal pulses are 2+ and equal bilaterally. Neuro: Alert and oriented X 3. No facial asymmetry. No focal deficit. Moves all extremities spontaneously. Psych:  Responds to questions appropriately with a normal affect.  EKG:  The EKG was personally reviewed and demonstrates EKG shows NSR with lateral TW changes - new TWI I, aVL, chronic in V5-V6.  Relevant CV Studies: Cath 2017  Conclusion     Mid RCA lesion, 25 %stenosed.  A STENT SYNERGY DES 4X16 drug eluting stent was successfully placed.  Mid LAD lesion, 70 %stenosed.  Post intervention, there is a 0% residual stenosis.   1. Single vessel obstructive CAD. 70% mid LAD with abnormal FFR of 0.67.  2. Successful stenting of the mid LAD with DES 3. Elevated LVEDP  Plan: DAPT for one year. Anticipate DC in am.   Laboratory Data:  Chemistry Recent Labs  Lab 12/07/17 1111  NA 139  K 2.8*  CL 100*  CO2 25  GLUCOSE 220*  BUN 10  CREATININE 0.72  CALCIUM 8.5*  GFRNONAA >60  GFRAA >60  ANIONGAP 14    No results for input(s): PROT, ALBUMIN, AST, ALT, ALKPHOS, BILITOT in the last 168 hours. Hematology Recent Labs  Lab 12/07/17 1111  WBC 8.1  RBC 4.08*  HGB 11.8*  HCT  36.5*  MCV 89.5  MCH 28.9  MCHC 32.3  RDW 14.0  PLT 230   Cardiac EnzymesNo results for input(s): TROPONINI in the last 168 hours.  Recent Labs  Lab 12/07/17 1233  TROPIPOC 0.00    BNP Recent Labs  Lab 12/07/17 1111  BNP 23.6    DDimer  Recent Labs  Lab 12/07/17 1111  DDIMER 0.47    Radiology/Studies:  Dg Chest 2 View  Result Date: 12/07/2017 CLINICAL DATA:  Ten days of left-sided chest pain which is been relieved with nitroglycerin. History of coronary artery disease with stent placement, diabetes, previous CVA, nonsmoker. EXAM: CHEST  2 VIEW COMPARISON:  Chest x-ray of September 04, 2017 FINDINGS: The lungs are adequately inflated. There is no focal infiltrate. There is no pleural effusion. The heart is top-normal in size. The pulmonary vascularity is normal. The mediastinum is normal in width. There is multilevel degenerative disc disease of the thoracic spine. IMPRESSION: There is no pneumonia nor other acute cardiopulmonary disease. Top-normal cardiac size. Electronically Signed   By: David  Stein Stein.D.   On: 12/07/2017 11:33   Vas [place] Lower Extremity Venous (dvt) (only [facility])  Result Date: 12/07/2017  Lower Venous Study Indication: Edema. Risk Factors: Obesity. 323 lbs Examination Guidelines: A complete evaluation includes B-mode imaging, spectral doppler, color doppler, and power doppler as needed of all accessible portions of each vessel. Bilateral testing is considered an integral part of a complete examination. Limited examinations for reoccurring indications may be performed as noted.  Right Venous Findings: +---+---------------+---------+-----------+----------+-------+    CompressibilityPhasicitySpontaneityPropertiesSummary +---+---------------+---------+-----------+----------+-------+ CFVFull           Yes      Yes                          +---+---------------+---------+-----------+----------+-------+  Left Venous Findings:  +---------+---------------+---------+-----------+----------+-------------------+          CompressibilityPhasicitySpontaneityPropertiesSummary             +---------+---------------+---------+-----------+----------+-------------------+ CFV      Full           Yes      Yes                                      +---------+---------------+---------+-----------+----------+-------------------+ FV Prox  Full           Yes      Yes                                      +---------+---------------+---------+-----------+----------+-------------------+ FV Mid   Full           Yes      Yes                                      +---------+---------------+---------+-----------+----------+-------------------+ FV DistalFull           Yes      Yes                                      +---------+---------------+---------+-----------+----------+-------------------+ PFV      Full           Yes      Yes                                      +---------+---------------+---------+-----------+----------+-------------------+ POP      Full           Yes      Yes                                      +---------+---------------+---------+-----------+----------+-------------------+ PTV      Full                                                             +---------+---------------+---------+-----------+----------+-------------------+ PERO                                                  Limited  visualization due                                                         to body habitus     +---------+---------------+---------+-----------+----------+-------------------+ Gastroc  Full                                                             +---------+---------------+---------+-----------+----------+-------------------+    Final Interpretation: Right: There is no evidence of a common femoral vein  obstruction. Left: Limited visualization due o body habitus. There is no obvious evidence of deep vein thrombosis of the lower extremity. There is no obvious evidence of a superficial thrombosis in the lower extremity. There is no obvious evidence of a Baker's cyst.  *See table(s) above for measurements and observations. Electronically signed by Servando Snare on 12/07/2017 at 3:20:29 PM.     Assessment and Plan:   1. Chest pain with mixed typical/atypical features - chest pain has mixed atypical/typical features - worse when specifically moving left arm, but has been NTG responsive with new TW changes in I, avL. Body habitus makes noninvasive testing significantly less sensitive for detection of ischemia. Furthermore, he has had multiple presentations for chest pain. Cardiac cath is indicated for definitive evaluation. Dr. Stanford Stein reviewed risks/benefits with patient who is agreeable to proceed. IM to admit given other complaints and comorbidities. We are OK with him being admitted to [facility] and can transfer to [facility] for cath. This will be tomorrow at 10:30 with Dr. Ellyn Hack. Orders written- currently listed under sign/held. Keep NPO after midnight. Otherwise OK to eat today once cleared by IM. Would hold off heparin unless enzymes turn positive.  Continue ASA, Plavix, nitrate, carvedilol and statin. Will also check UDS for completeness.  2. Chronic dyspnea, likely multifactorial, compounded by severe obesity with untreated OSA - cath as above. Importance of long-term weight loss reviewed with patient.  3. Chronic diastolic CHF - volume status nearly impossible to assess given habitus. CXR was nonacute. He reports LEE flucutates. Prior dietary noncompliance noted in chart. Anticipate LVEDP will be evaluated by cath. Continue home regimen.  4. Hypokalemia - frequently hypokalemic. Likely needs more standing repletion. Further management per primary team.  5. HTN - poor control today in ER but questionable  compliance. Can continue home regimen and follow.  6. DM - when admitting team places orders, should keep in mind patient will be NPO after midnight for cath at 10:30 therefore home regimen will likely need to be adjusted.  For questions or updates, please contact [place] Please consult [email or web address] for contact info under Cardiology/STEMI.    Signed, Charlie Pitter, PA-C  12/07/2017 3:24 PM As above, patient seen and examined.  Briefly he is a 57 year old male with past medical history of coronary artery disease, hypertension, diabetes mellitus, hyperlipidemia,, question prior stroke, chronic diastolic congestive heart failure, dilated thoracic aorta with recurrent chest pain.  Patient had PCI of his LAD in August 2017.  He had no other obstructive disease at that time.  He has continued to have chest pain with multiple ER visits.  He states he develops chest pain radiating  to his left upper extremity.  It is described as a pressure.  There is shortness of breath.  It is improved with nitroglycerin.  It occurs both with exertion and at rest.  It is not pleuritic or related to food.  He also has chronic dyspnea on exertion.  Patient presents to the emergency room for further evaluation. Troponin is 0.00.  BNP 23.6.  Hemoglobin 11.8.  Electrocardiogram shows sinus rhythm with slightly more prominent lateral T wave inversion.  1 chest pain-difficult situation.  Patient has had previous PCI but also has chronic chest pain.  He has had multiple emergency room visits (15 in 2018).  I feel we must be definitive.  We will plan to proceed with cardiac catheterization to rule out restenosis of LAD.  The risks and benefits including myocardial infarction, CVA and death discussed and he agrees to proceed.  Hold Glucophage for 48 hours following procedure.  2 history of chronic diastolic congestive heart failure-would hold Lasix prior to procedure.  3 hypertension-blood pressure is elevated.  However  unclear if he is compliant with all of his medications.  Would resume home regimen and follow.  4 coronary artery disease-continue aspirin, Plavix and statin.  Kirk Ruths, MD

## 2017-12-07 NOTE — ED Triage Notes (Addendum)
Per pt, states he has been having left chest pain for 10 days-states he has been using his nitro for relief-cant see cardiology till March-states having nose bleeds and had metal taste in mouth this am-stent placed in 2017

## 2017-12-07 NOTE — Consult Note (Signed)
Cardiology Consultation:   Patient ID: [name]; [id number]; 23-Feb-1961   Admit date: 12/07/2017 Date of Consult: 12/07/2017  Primary Care Provider: Rogers Blocker, MD Primary Cardiologist: Dr. Stanford Breed previously, pt transitioned care to [facility] closer to home  Chief Complaint: chest pain, nose bleeds, metal taste in mouth  Patient Profile:   [name] is a 57 y.o. male with a hx of PUD 2005, CAD (s/p DES to LAD 05/2016), frequent ED visits, morbid obesity, cervical radiculopathy, HTN, HLD, DM2, peripheral neuropathy/chronic pain, OSA, DVT ?2016-2017 in [place] (on Xarelto for several months per pt report), reported hx of stroke (not seen on imaging during adm for TIA symptoms 05/2017 felt more likely due to cervical radiculopathy), chronic dCHF, multifactorial/B12 deficient anemia, depression, 4.3cm thoracic aorta by CT 06/2017 who is being seen today for the evaluation of chest pain at the request of Dr. Leonette Monarch.  History of Present Illness:   [name] has history of numerous encounters for atypical chest pain amongst other varying complaints (total of 15 ER visits in 2018, 5 in the last 6 months). Last cath was in 05/2016 with 70% LAD with abnormal FFR 0.67, residual 25% mRCA, elevated LVEDP. Last echo was in 06/2017 with mild LVH, EF 50-55%, grade 1 DD.  He presented to the ER with 10 days worth of intermittent chest pain, about once a day. Some of these episodes have occurred while watching TV; others have occurred during exertion including during sex. He has chronic dyspnea which is unchanged. Episodes last about 15 minutes, but are able to be cut short if he takes a SL NTG. He estimates he's taken about 10 over the last 10 days. He is able to make the pain worse by lifting or rotating his left arm. It feels like prior angina as well as prior atypical CP. BNP normal and troponin is negative, d-dimer negative, hemoglobin is 11.8 (baseline 11.2-12), K 2.8 and  glucose 220. EKG shows NSR with lateral TW changes - new TWI I, aVL, chronic in V5-V6. He is not tachycardic, tachypneic or hypoxic but is hypertensive up to 178/79. He reports frequent hx of LEE. LE duplex for DVT.  He reports spotty compliance with CPAP. When asked if he takes his medication regularly, he admits he occasionally misses doses if he has a doctor's appointment. This was also mentioned during his OV 06/2017 when his BP was high.   Past Medical History:  Diagnosis Date  . Arthritis   . Back pain   . CAD (coronary artery disease)    a. s/p DES to LAD in 05/2016  . Cervical radiculopathy   . Chronic diastolic CHF (congestive heart failure) ([place])   . Chronic pain   . Depression   . DVT (deep venous thrombosis) ([place])   . Hematemesis   . Hepatic steatosis   . Hyperlipidemia   . Hypertension   . IBS (irritable bowel syndrome)   . Morbid obesity ([place])   . OSA (obstructive sleep apnea)   . Pancreatitis   . PE (pulmonary thromboembolism) ([place])   . Peripheral neuropathy   . PUD (peptic ulcer disease)   . Renal disorder   . Stroke [facility])    a. ?details unclear - not seen on imaging when he was admitted in 05/2017 for TIA symptoms which were felt due to cervical radiculopathy.  . Thoracic aortic ectasia ([facility])    a. 4.3cm ectatic ascending thoracic aorta by CT 06/2017.   Marland Kitchen Type  2 diabetes mellitus ([place])     Past Surgical History:  Procedure Laterality Date  . CARDIAC CATHETERIZATION N/A 05/31/2016   Procedure: Left Heart Cath and Coronary Angiography;  Surgeon: Peter M Martinique, MD;  Location: [place] CV LAB;  Service: Cardiovascular;  Laterality: N/A;  . CARDIAC CATHETERIZATION N/A 05/31/2016   Procedure: Intravascular Pressure Wire/FFR Study;  Surgeon: Peter M Martinique, MD;  Location: [place] CV LAB;  Service: Cardiovascular;  Laterality: N/A;  . CARDIAC CATHETERIZATION N/A 05/31/2016   Procedure: Coronary Stent Intervention;  Surgeon: Peter M Martinique, MD;  Location: [place]  CV LAB;  Service: Cardiovascular;  Laterality: N/A;  . LEFT HEART CATHETERIZATION WITH CORONARY ANGIOGRAM N/A 02/03/2014   Procedure: LEFT HEART CATHETERIZATION WITH CORONARY ANGIOGRAM;  Surgeon: Pixie Casino, MD;  Location: [facility] CATH LAB;  Service: Cardiovascular;  Laterality: N/A;  . left leg stent        Inpatient Medications: Scheduled Meds:  Continuous Infusions:  PRN Meds:   Home Meds: Prior to Admission medications   Medication Sig Start Date End Date Taking? Authorizing Provider  aspirin 81 MG chewable tablet Chew 1 tablet (81 mg total) by mouth daily. 06/01/16  Yes Elwin Mocha, MD  atorvastatin (LIPITOR) 80 MG tablet Take 1 tablet (80 mg total) by mouth daily. 01/26/17  Yes Strader, Fransisco Hertz, PA-C  carvedilol (COREG) 25 MG tablet Take 1 tablet (25 mg total) by mouth 2 (two) times daily with a meal. 01/26/17  Yes Strader, [place] M, PA-C  clopidogrel (PLAVIX) 75 MG tablet Take 75 mg by mouth daily.   Yes [provider]  DULoxetine (CYMBALTA) 60 MG capsule Take 1 capsule (60 mg total) by mouth daily. 11/06/17  Yes Marcial Pacas, MD  famotidine (PEPCID) 40 MG tablet Take 1 tablet (40 mg total) by mouth at bedtime. Patient taking differently: Take 40 mg by mouth daily.  06/18/17  Yes Barton Dubois, MD  furosemide (LASIX) 40 MG tablet Take 1 tablet (40 mg total) by mouth 2 (two) times daily. 01/26/17  Yes Strader, [place] M, PA-C  glipiZIDE (GLUCOTROL) 10 MG tablet Take 10 mg by mouth 2 (two) times daily. 05/30/17  Yes [provider]  hydrALAZINE (APRESOLINE) 50 MG tablet Take 1 tablet (50 mg total) by mouth 3 (three) times daily. 07/11/17  Yes Barrett, Evelene Croon, PA-C  Insulin Detemir (LEVEMIR) 100 UNIT/ML Pen Inject 20 Units into the skin daily at 10 pm. Patient taking differently: Inject 50 Units daily at 10 pm into the skin.  06/18/17  Yes Barton Dubois, MD  isosorbide dinitrate (ISORDIL) 30 MG tablet Take 1 tablet (30 mg total) by mouth 2 (two) times  daily. Patient taking differently: Take 30 mg by mouth every morning.  11/22/16  Yes Strader, [place] M, PA-C  LYRICA 100 MG capsule Take 100 mg by mouth 2 (two) times daily. 11/29/17  Yes [provider]  metFORMIN (GLUCOPHAGE-XR) 500 MG 24 hr tablet Take 2 tablets (1,000 mg total) by mouth 2 (two) times daily. 07/02/17  Yes Barton Dubois, MD  nitroGLYCERIN (NITROSTAT) 0.4 MG SL tablet Place 0.4 mg under the tongue every 5 (five) minutes as needed for chest pain. 12/17/16  Yes [provider]  ondansetron (ZOFRAN) 4 MG tablet Take 1 tablet (4 mg total) every 8 (eight) hours as needed by mouth for nausea or vomiting. 09/02/17  Yes Harris, Abigail, PA-C  oxyCODONE (ROXICODONE) 15 MG immediate release tablet Take 15 mg 3 (three) times daily as needed by mouth for  pain.  08/28/17  Yes [provider]  pantoprazole (PROTONIX) 40 MG tablet Take 1 tablet (40 mg total) by mouth 2 (two) times daily. 06/18/17  Yes Barton Dubois, MD  potassium chloride SA (K-DUR,KLOR-CON) 20 MEQ tablet Take 20 mEq by mouth daily.   Yes [provider]  propranolol ER (INDERAL LA) 80 MG 24 hr capsule Take 1 capsule (80 mg total) by mouth at bedtime. 08/09/17  Yes Marcial Pacas, MD  sertraline (ZOLOFT) 50 MG tablet Take 50 mg by mouth daily.   Yes [provider]  sitaGLIPtin (JANUVIA) 100 MG tablet Take 100 mg by mouth daily.   Yes [provider]  tiZANidine (ZANAFLEX) 2 MG tablet Take 2 mg by mouth every 6 (six) hours as needed for muscle spasms.   Yes [provider]  traZODone (DESYREL) 50 MG tablet Take 1 tablet (50 mg total) by mouth at bedtime. 12/15/16  Yes Burns, Arloa Koh, MD  Vitamin D, Ergocalciferol, (DRISDOL) 50000 units CAPS capsule Take 1 capsule by mouth every Monday. 11/06/17  Yes [provider]  Insulin Pen Needle 31G X 5 MM MISC Use 1 needle daily to inject insulin as prescribed 06/18/17   Barton Dubois, MD  ondansetron (ZOFRAN ODT) 4 MG  disintegrating tablet Take 1 tablet (4 mg total) by mouth every 8 (eight) hours as needed for nausea or vomiting. Patient not taking: Reported on 12/07/2017 06/09/17   Varney Biles, MD    Allergies:    Allergies  Allergen Reactions  . Coconut Flavor [Flavoring Agent] Hives  . Coconut Oil Hives  . Ibuprofen Other (See Comments)    Made gastric ulcers worse  . Aleve [Naproxen]     DUE TO KIDNEYS    Social History:   Social History   Socioeconomic History  . Marital status: Single    Spouse name: Not on file  . Number of children: 3  . Years of education: 57  . Highest education level: Not on file  Social Needs  . Financial resource strain: Not on file  . Food insecurity - worry: Not on file  . Food insecurity - inability: Not on file  . Transportation needs - medical: Not on file  . Transportation needs - non-medical: Not on file  Occupational History  . Occupation: Disabled  Tobacco Use  . Smoking status: Never Smoker  . Smokeless tobacco: Never Used  Substance and Sexual Activity  . Alcohol use: No  . Drug use: No  . Sexual activity: Not on file  Other Topics Concern  . Not on file  Social History Narrative   Independent and ambulatory with cane.   Lives at home alone.   Right-handed.   1-2 cups caffeine per day.    Family History:   The patient's family history includes Breast cancer in his mother; Cancer in his father; Diabetes in his brother, mother, and sister; Hypertension in his brother, mother, and sister.   ROS:  Please see the history of present illness.  all other ROS reviewed and negative.     Physical Exam/Data:   Vitals:   12/07/17 1300 12/07/17 1302 12/07/17 1330 12/07/17 1400  BP: (!) 161/87 (!) 161/87 (!) 187/97 (!) 159/98  Pulse: 71 78 67 70  Resp:  18 19 14   Temp:      TempSrc:      SpO2: 94% 97% 93% 97%   No intake or output data in the 24 hours ending 12/07/17 1524 There were no vitals filed for  this visit. There is no height or  weight on file to calculate BMI.  General: Well developed, well nourished morbidly obese AAM in no acute distress. Head: Normocephalic, atraumatic, sclera non-icteric, no xanthomas, nares are without discharge.  Neck: Negative for carotid bruits. JVD not elevated. Lungs: Clear bilaterally to auscultation without wheezes, rales, or rhonchi. Breathing is unlabored. Heart: RRR with S1 S2. No murmurs, rubs, or gallops appreciated. Abdomen: Soft, non-tender, non-distended with normoactive bowel sounds. No hepatomegaly. No rebound/guarding. No obvious abdominal masses. Msk:  Strength and tone appear normal for age. Extremities: No clubbing or cyanosis. Chronic appearing venous stasis changes with mild edema superimposed on baseline large leg habitus.  Distal pedal pulses are 2+ and equal bilaterally. Neuro: Alert and oriented X 3. No facial asymmetry. No focal deficit. Moves all extremities spontaneously. Psych:  Responds to questions appropriately with a normal affect.  EKG:  The EKG was personally reviewed and demonstrates EKG shows NSR with lateral TW changes - new TWI I, aVL, chronic in V5-V6.  Relevant CV Studies: Cath 2017  Conclusion     Mid RCA lesion, 25 %stenosed.  A STENT SYNERGY DES 4X16 drug eluting stent was successfully placed.  Mid LAD lesion, 70 %stenosed.  Post intervention, there is a 0% residual stenosis.   1. Single vessel obstructive CAD. 70% mid LAD with abnormal FFR of 0.67.  2. Successful stenting of the mid LAD with DES 3. Elevated LVEDP  Plan: DAPT for one year. Anticipate DC in am.   Laboratory Data:  Chemistry Recent Labs  Lab 12/07/17 1111  NA 139  K 2.8*  CL 100*  CO2 25  GLUCOSE 220*  BUN 10  CREATININE 0.72  CALCIUM 8.5*  GFRNONAA >60  GFRAA >60  ANIONGAP 14    No results for input(s): PROT, ALBUMIN, AST, ALT, ALKPHOS, BILITOT in the last 168 hours. Hematology Recent Labs  Lab 12/07/17 1111  WBC 8.1  RBC 4.08*  HGB 11.8*  HCT  36.5*  MCV 89.5  MCH 28.9  MCHC 32.3  RDW 14.0  PLT 230   Cardiac EnzymesNo results for input(s): TROPONINI in the last 168 hours.  Recent Labs  Lab 12/07/17 1233  TROPIPOC 0.00    BNP Recent Labs  Lab 12/07/17 1111  BNP 23.6    DDimer  Recent Labs  Lab 12/07/17 1111  DDIMER 0.47    Radiology/Studies:  Dg Chest 2 View  Result Date: 12/07/2017 CLINICAL DATA:  Ten days of left-sided chest pain which is been relieved with nitroglycerin. History of coronary artery disease with stent placement, diabetes, previous CVA, nonsmoker. EXAM: CHEST  2 VIEW COMPARISON:  Chest x-ray of September 04, 2017 FINDINGS: The lungs are adequately inflated. There is no focal infiltrate. There is no pleural effusion. The heart is top-normal in size. The pulmonary vascularity is normal. The mediastinum is normal in width. There is multilevel degenerative disc disease of the thoracic spine. IMPRESSION: There is no pneumonia nor other acute cardiopulmonary disease. Top-normal cardiac size. Electronically Signed   By: David  Martinique M.D.   On: 12/07/2017 11:33   Vas [place] Lower Extremity Venous (dvt) (only [facility])  Result Date: 12/07/2017  Lower Venous Study Indication: Edema. Risk Factors: Obesity. 323 lbs Examination Guidelines: A complete evaluation includes B-mode imaging, spectral doppler, color doppler, and power doppler as needed of all accessible portions of each vessel. Bilateral testing is considered an integral part of a complete examination. Limited examinations for reoccurring indications may be performed as noted.  Right Venous Findings: +---+---------------+---------+-----------+----------+-------+    CompressibilityPhasicitySpontaneityPropertiesSummary +---+---------------+---------+-----------+----------+-------+ CFVFull           Yes      Yes                          +---+---------------+---------+-----------+----------+-------+  Left Venous Findings:  +---------+---------------+---------+-----------+----------+-------------------+          CompressibilityPhasicitySpontaneityPropertiesSummary             +---------+---------------+---------+-----------+----------+-------------------+ CFV      Full           Yes      Yes                                      +---------+---------------+---------+-----------+----------+-------------------+ FV Prox  Full           Yes      Yes                                      +---------+---------------+---------+-----------+----------+-------------------+ FV Mid   Full           Yes      Yes                                      +---------+---------------+---------+-----------+----------+-------------------+ FV DistalFull           Yes      Yes                                      +---------+---------------+---------+-----------+----------+-------------------+ PFV      Full           Yes      Yes                                      +---------+---------------+---------+-----------+----------+-------------------+ POP      Full           Yes      Yes                                      +---------+---------------+---------+-----------+----------+-------------------+ PTV      Full                                                             +---------+---------------+---------+-----------+----------+-------------------+ PERO                                                  Limited  visualization due                                                         to body habitus     +---------+---------------+---------+-----------+----------+-------------------+ Gastroc  Full                                                             +---------+---------------+---------+-----------+----------+-------------------+    Final Interpretation: Right: There is no evidence of a common femoral vein  obstruction. Left: Limited visualization due o body habitus. There is no obvious evidence of deep vein thrombosis of the lower extremity. There is no obvious evidence of a superficial thrombosis in the lower extremity. There is no obvious evidence of a Baker's cyst.  *See table(s) above for measurements and observations. Electronically signed by Servando Snare on 12/07/2017 at 3:20:29 PM.     Assessment and Plan:   1. Chest pain with mixed typical/atypical features - chest pain has mixed atypical/typical features - worse when specifically moving left arm, but has been NTG responsive with new TW changes in I, avL. Body habitus makes noninvasive testing significantly less sensitive for detection of ischemia. Furthermore, he has had multiple presentations for chest pain. Cardiac cath is indicated for definitive evaluation. Dr. Stanford Breed reviewed risks/benefits with patient who is agreeable to proceed. IM to admit given other complaints and comorbidities. We are OK with him being admitted to [facility] and can transfer to [facility] for cath. This will be tomorrow at 10:30 with Dr. Ellyn Hack. Orders written- currently listed under sign/held. Keep NPO after midnight. Otherwise OK to eat today once cleared by IM. Would hold off heparin unless enzymes turn positive.  Continue ASA, Plavix, nitrate, carvedilol and statin. Will also check UDS for completeness.  2. Chronic dyspnea, likely multifactorial, compounded by severe obesity with untreated OSA - cath as above. Importance of long-term weight loss reviewed with patient.  3. Chronic diastolic CHF - volume status nearly impossible to assess given habitus. CXR was nonacute. He reports LEE flucutates. Prior dietary noncompliance noted in chart. Anticipate LVEDP will be evaluated by cath. Continue home regimen.  4. Hypokalemia - frequently hypokalemic. Likely needs more standing repletion. Further management per primary team.  5. HTN - poor control today in ER but questionable  compliance. Can continue home regimen and follow.  6. DM - when admitting team places orders, should keep in mind patient will be NPO after midnight for cath at 10:30 therefore home regimen will likely need to be adjusted.  For questions or updates, please contact [place] Please consult [email or web address] for contact info under Cardiology/STEMI.    Signed, Charlie Pitter, PA-C  12/07/2017 3:24 PM As above, patient seen and examined.  Briefly he is a 57 year old male with past medical history of coronary artery disease, hypertension, diabetes mellitus, hyperlipidemia,, question prior stroke, chronic diastolic congestive heart failure, dilated thoracic aorta with recurrent chest pain.  Patient had PCI of his LAD in August 2017.  He had no other obstructive disease at that time.  He has continued to have chest pain with multiple ER visits.  He states he develops chest pain radiating  to his left upper extremity.  It is described as a pressure.  There is shortness of breath.  It is improved with nitroglycerin.  It occurs both with exertion and at rest.  It is not pleuritic or related to food.  He also has chronic dyspnea on exertion.  Patient presents to the emergency room for further evaluation. Troponin is 0.00.  BNP 23.6.  Hemoglobin 11.8.  Electrocardiogram shows sinus rhythm with slightly more prominent lateral T wave inversion.  1 chest pain-difficult situation.  Patient has had previous PCI but also has chronic chest pain.  He has had multiple emergency room visits (15 in 2018).  I feel we must be definitive.  We will plan to proceed with cardiac catheterization to rule out restenosis of LAD.  The risks and benefits including myocardial infarction, CVA and death discussed and he agrees to proceed.  Hold Glucophage for 48 hours following procedure.  2 history of chronic diastolic congestive heart failure-would hold Lasix prior to procedure.  3 hypertension-blood pressure is elevated.  However  unclear if he is compliant with all of his medications.  Would resume home regimen and follow.  4 coronary artery disease-continue aspirin, Plavix and statin.  Kirk Ruths, MD

## 2017-12-07 NOTE — H&P (Signed)
History and Physical    [name] IEP:[id number] DOB: [birth year] DOA: 12/07/2017  PCP: Rogers Blocker, MD   Patient coming from: Home   Chief Complaint: Chest pain  HPI: [name] is a 57 y.o. male with medical history significant of coronary artery disease status post drug-eluting stent to LAD in August 2017, hypertension, hyperlipidemia, type 2 diabetes, likely provoked DVT, obstructive sleep apnea, TIAs, diastolic heart failure, anemia who comes in with chest pain.  Patient has had approximately 15 ER visits in the past year for chest pain.  Last cardiac catheterization resulted in stent in 2017 showed a 70% LAD lesion with abnormal fractional flow reserve.  He reports that frequently without any particular exacerbating factor he has episodes of squeezing left-sided chest pain.  He reports is occasionally radiates down his arm.  He reports this pain lasts for several minutes and improves with sublingual nitro.  He reports that for the past 2 weeks this episodes of chest pain have been occurring daily.  He denies any syncope, presyncope, nausea vomiting, diarrhea, abdominal pain, lower extremity edema, orthopnea, paroxysmal nocturnal dyspnea.  He does have chronic lower extremity swelling he reports somewhat compliance with his CPAP machine.  He is just started a physical therapy program however he does not report that any of the physical therapy regimen induces this chest pain.  ED Course: Patient was seen in the ED vitals wereNotable only for mild hypertension.  Labs were notable for negative troponin.  Chronic anemia.  Hypokalemia.  Cardiology was consulted and recommended cardiac catheterization.  DVT ultrasound of lower extremities was negative.  Patient was admitted for observation and cardiac catheterization tomorrow.  Review of Systems: As per HPI otherwise 10 point review of systems negative.     Past Medical History:  Diagnosis Date  . Arthritis   . Back pain   . CAD  (coronary artery disease)    a. s/p DES to LAD in 05/2016  . Cervical radiculopathy   . Chronic diastolic CHF (congestive heart failure) ([place])   . Chronic pain   . Depression   . DVT (deep venous thrombosis) ([place])   . Hematemesis   . Hepatic steatosis   . Hyperlipidemia   . Hypertension   . IBS (irritable bowel syndrome)   . Morbid obesity ([place])   . OSA (obstructive sleep apnea)   . Pancreatitis   . PE (pulmonary thromboembolism) ([place])   . Peripheral neuropathy   . PUD (peptic ulcer disease)   . Renal disorder   . Stroke [facility])    a. ?details unclear - not seen on imaging when he was admitted in 05/2017 for TIA symptoms which were felt due to cervical radiculopathy.  . Thoracic aortic ectasia (H[facility])    a. 4.3cm ectatic ascending thoracic aorta by CT 06/2017.   . Type 2 diabetes mellitus ([place])     Past Surgical History:  Procedure Laterality Date  . CARDIAC CATHETERIZATION N/A 05/31/2016   Procedure: Left Heart Cath and Coronary Angiography;  Surgeon: Peter M Martinique, MD;  Location: [place] [facility];  Service: Cardiovascular;  Laterality: N/A;  . CARDIAC CATHETERIZATION N/A 05/31/2016   Procedure: Intravascular Pressure Wire/FFR Study;  Surgeon: Peter M Martinique, MD;  Location: [place] [facility];  Service: Cardiovascular;  Laterality: N/A;  . CARDIAC CATHETERIZATION N/A 05/31/2016   Procedure: Coronary Stent Intervention;  Surgeon: Peter M Martinique, MD;  Location: [place] [facility];  Service: Cardiovascular;  Laterality: N/A;  . LEFT HEART CATHETERIZATION  WITH CORONARY ANGIOGRAM N/A 02/03/2014   Procedure: LEFT HEART CATHETERIZATION WITH CORONARY ANGIOGRAM;  Surgeon: Pixie Casino, MD;  Location: [facility];  Service: Cardiovascular;  Laterality: N/A;  . left leg stent        reports that  has never smoked. he has never used smokeless tobacco. He reports that he does not drink alcohol or use drugs.  Allergies  Allergen Reactions  . Coconut Flavor [Flavoring Agent] Hives  . Coconut  Oil Hives  . Ibuprofen Other (See Comments)    Made gastric ulcers worse  . Aleve [Naproxen]     DUE TO KIDNEYS    Family History  Problem Relation Age of Onset  . Cancer Father   . Hypertension Mother   . Diabetes Mother   . Breast cancer Mother   . Hypertension Brother   . Diabetes Brother   . Hypertension Sister   . Diabetes Sister    Unacceptable: Noncontributory, unremarkable, or negative. Acceptable: Family history reviewed and not pertinent (If you reviewed it)  Prior to Admission medications   Medication Sig Start Date End Date Taking? Authorizing Provider  aspirin 81 MG chewable tablet Chew 1 tablet (81 mg total) by mouth daily. 06/01/16  Yes Elwin Mocha, MD  atorvastatin (LIPITOR) 80 MG tablet Take 1 tablet (80 mg total) by mouth daily. 01/26/17  Yes Strader, Fransisco Hertz, PA-C  carvedilol (COREG) 25 MG tablet Take 1 tablet (25 mg total) by mouth 2 (two) times daily with a meal. 01/26/17  Yes Strader, [place] M, PA-C  clopidogrel (PLAVIX) 75 MG tablet Take 75 mg by mouth daily.   Yes [provider]  DULoxetine (CYMBALTA) 60 MG capsule Take 1 capsule (60 mg total) by mouth daily. 11/06/17  Yes Marcial Pacas, MD  famotidine (PEPCID) 40 MG tablet Take 1 tablet (40 mg total) by mouth at bedtime. Patient taking differently: Take 40 mg by mouth daily.  06/18/17  Yes Barton Dubois, MD  furosemide (LASIX) 40 MG tablet Take 1 tablet (40 mg total) by mouth 2 (two) times daily. 01/26/17  Yes Strader, [place] M, PA-C  glipiZIDE (GLUCOTROL) 10 MG tablet Take 10 mg by mouth 2 (two) times daily. 05/30/17  Yes [provider]  hydrALAZINE (APRESOLINE) 50 MG tablet Take 1 tablet (50 mg total) by mouth 3 (three) times daily. 07/11/17  Yes Barrett, Evelene Croon, PA-C  Insulin Detemir (LEVEMIR) 100 UNIT/ML Pen Inject 20 Units into the skin daily at 10 pm. Patient taking differently: Inject 50 Units daily at 10 pm into the skin.  06/18/17  Yes Barton Dubois, MD  isosorbide dinitrate  (ISORDIL) 30 MG tablet Take 1 tablet (30 mg total) by mouth 2 (two) times daily. Patient taking differently: Take 30 mg by mouth every morning.  11/22/16  Yes Strader, [place] M, PA-C  LYRICA 100 MG capsule Take 100 mg by mouth 2 (two) times daily. 11/29/17  Yes [provider]  metFORMIN (GLUCOPHAGE-XR) 500 MG 24 hr tablet Take 2 tablets (1,000 mg total) by mouth 2 (two) times daily. 07/02/17  Yes Barton Dubois, MD  nitroGLYCERIN (NITROSTAT) 0.4 MG SL tablet Place 0.4 mg under the tongue every 5 (five) minutes as needed for chest pain. 12/17/16  Yes [provider]  ondansetron (ZOFRAN) 4 MG tablet Take 1 tablet (4 mg total) every 8 (eight) hours as needed by mouth for nausea or vomiting. 09/02/17  Yes Harris, Abigail, PA-C  oxyCODONE (ROXICODONE) 15 MG immediate release tablet Take 15 mg 3 (three)  times daily as needed by mouth for pain.  08/28/17  Yes [provider]  pantoprazole (PROTONIX) 40 MG tablet Take 1 tablet (40 mg total) by mouth 2 (two) times daily. 06/18/17  Yes Barton Dubois, MD  potassium chloride SA (K-DUR,KLOR-CON) 20 MEQ tablet Take 20 mEq by mouth daily.   Yes [provider]  propranolol ER (INDERAL LA) 80 MG 24 hr capsule Take 1 capsule (80 mg total) by mouth at bedtime. 08/09/17  Yes Marcial Pacas, MD  sertraline (ZOLOFT) 50 MG tablet Take 50 mg by mouth daily.   Yes [provider]  sitaGLIPtin (JANUVIA) 100 MG tablet Take 100 mg by mouth daily.   Yes [provider]  tiZANidine (ZANAFLEX) 2 MG tablet Take 2 mg by mouth every 6 (six) hours as needed for muscle spasms.   Yes [provider]  traZODone (DESYREL) 50 MG tablet Take 1 tablet (50 mg total) by mouth at bedtime. 12/15/16  Yes Burns, Arloa Koh, MD  Vitamin D, Ergocalciferol, (DRISDOL) 50000 units CAPS capsule Take 1 capsule by mouth every Monday. 11/06/17  Yes [provider]  Insulin Pen Needle 31G X 5 MM MISC Use 1 needle daily to inject insulin as  prescribed 06/18/17   Barton Dubois, MD  ondansetron (ZOFRAN ODT) 4 MG disintegrating tablet Take 1 tablet (4 mg total) by mouth every 8 (eight) hours as needed for nausea or vomiting. Patient not taking: Reported on 12/07/2017 06/09/17   Varney Biles, MD    Physical Exam: Vitals:   12/07/17 1300 12/07/17 1302 12/07/17 1330 12/07/17 1400  BP: (!) 161/87 (!) 161/87 (!) 187/97 (!) 159/98  Pulse: 71 78 67 70  Resp:  18 19 14   Temp:      TempSrc:      SpO2: 94% 97% 93% 97%    Constitutional: NAD, calm, comfortable Vitals:   12/07/17 1300 12/07/17 1302 12/07/17 1330 12/07/17 1400  BP: (!) 161/87 (!) 161/87 (!) 187/97 (!) 159/98  Pulse: 71 78 67 70  Resp:  18 19 14   Temp:      TempSrc:      SpO2: 94% 97% 93% 97%   Eyes: Anicteric l ENMT: Poor dentition,  Neck: pickwickian neck Respiratory: Clear to auscultation bilaterally, no crackles rales or rhonchi Cardiovascular: Distant heart sounds, normal S1-S2, no murmurs Abdomen: no tenderness, no masses palpated. No hepatosplenomegaly. Bowel sounds positive.  Musculoskeletal: Trace lower extremity edema Skin: Tonic venous stasis changes Neurologic: Moving all extremities, Psychiatric: Normal judgment and insight. Alert and oriented x 3. Normal mood.   (Anything < 9 systems with 2 bullets each down codes to level 1) (If patient refuses exam can't bill higher level) (Make sure to document decubitus ulcers present on admission -- if possible -- and whether patient has chronic indwelling catheter at time of admission)  Labs on Admission: I have personally reviewed following labs and imaging studies  CBC: Recent Labs  Lab 12/07/17 1111  WBC 8.1  HGB 11.8*  HCT 36.5*  MCV 89.5  PLT 629   Basic Metabolic Panel: Recent Labs  Lab 12/07/17 1111  NA 139  K 2.8*  CL 100*  CO2 25  GLUCOSE 220*  BUN 10  CREATININE 0.72  CALCIUM 8.5*   GFR: CrCl cannot be calculated (Unknown ideal weight.). Liver Function Tests: No results  for input(s): AST, ALT, ALKPHOS, BILITOT, PROT, ALBUMIN in the last 168 hours. No results for input(s): LIPASE, AMYLASE in the last 168 hours. No results for input(s): AMMONIA  in the last 168 hours. Coagulation Profile: No results for input(s): INR, PROTIME in the last 168 hours. Cardiac Enzymes: No results for input(s): CKTOTAL, CKMB, CKMBINDEX, TROPONINI in the last 168 hours. BNP (last 3 results) No results for input(s): PROBNP in the last 8760 hours. HbA1C: No results for input(s): HGBA1C in the last 72 hours. CBG: No results for input(s): GLUCAP in the last 168 hours. Lipid Profile: No results for input(s): CHOL, HDL, LDLCALC, TRIG, CHOLHDL, LDLDIRECT in the last 72 hours. Thyroid Function Tests: No results for input(s): TSH, T4TOTAL, FREET4, T3FREE, THYROIDAB in the last 72 hours. Anemia Panel: No results for input(s): VITAMINB12, FOLATE, FERRITIN, TIBC, IRON, RETICCTPCT in the last 72 hours. Urine analysis:    Component Value Date/Time   COLORURINE AMBER (A) 09/02/2017 1030   APPEARANCEUR CLEAR 09/02/2017 1030   LABSPEC 1.014 09/02/2017 1030   PHURINE 9.0 (H) 09/02/2017 1030   GLUCOSEU 50 (A) 09/02/2017 1030   HGBUR SMALL (A) 09/02/2017 1030   BILIRUBINUR SMALL (A) 09/02/2017 1030   KETONESUR NEGATIVE 09/02/2017 1030   PROTEINUR >=300 (A) 09/02/2017 1030   UROBILINOGEN 0.2 05/27/2014 2312   NITRITE NEGATIVE 09/02/2017 1030   LEUKOCYTESUR NEGATIVE 09/02/2017 1030    Radiological Exams on Admission: Dg Chest 2 View  Result Date: 12/07/2017 CLINICAL DATA:  Ten days of left-sided chest pain which is been relieved with nitroglycerin. History of coronary artery disease with stent placement, diabetes, previous CVA, nonsmoker. EXAM: CHEST  2 VIEW COMPARISON:  Chest x-ray of September 04, 2017 FINDINGS: The lungs are adequately inflated. There is no focal infiltrate. There is no pleural effusion. The heart is top-normal in size. The pulmonary vascularity is normal. The mediastinum  is normal in width. There is multilevel degenerative disc disease of the thoracic spine. IMPRESSION: There is no pneumonia nor other acute cardiopulmonary disease. Top-normal cardiac size. Electronically Signed   By: David  Martinique M.D.   On: 12/07/2017 11:33   Vas [place] Lower Extremity Venous (dvt) (only [facility])  Result Date: 12/07/2017  Lower Venous Study Indication: Edema. Risk Factors: Obesity. 323 lbs Examination Guidelines: A complete evaluation includes B-mode imaging, spectral doppler, color doppler, and power doppler as needed of all accessible portions of each vessel. Bilateral testing is considered an integral part of a complete examination. Limited examinations for reoccurring indications may be performed as noted.  Right Venous Findings: +---+---------------+---------+-----------+----------+-------+    CompressibilityPhasicitySpontaneityPropertiesSummary +---+---------------+---------+-----------+----------+-------+ CFVFull           Yes      Yes                          +---+---------------+---------+-----------+----------+-------+  Left Venous Findings: +---------+---------------+---------+-----------+----------+-------------------+          CompressibilityPhasicitySpontaneityPropertiesSummary             +---------+---------------+---------+-----------+----------+-------------------+ CFV      Full           Yes      Yes                                      +---------+---------------+---------+-----------+----------+-------------------+ FV Prox  Full           Yes      Yes                                      +---------+---------------+---------+-----------+----------+-------------------+  FV Mid   Full           Yes      Yes                                      +---------+---------------+---------+-----------+----------+-------------------+ FV DistalFull           Yes      Yes                                       +---------+---------------+---------+-----------+----------+-------------------+ PFV      Full           Yes      Yes                                      +---------+---------------+---------+-----------+----------+-------------------+ POP      Full           Yes      Yes                                      +---------+---------------+---------+-----------+----------+-------------------+ PTV      Full                                                             +---------+---------------+---------+-----------+----------+-------------------+ PERO                                                  Limited                                                                   visualization due                                                         to body habitus     +---------+---------------+---------+-----------+----------+-------------------+ Gastroc  Full                                                             +---------+---------------+---------+-----------+----------+-------------------+    Final Interpretation: Right: There is no evidence of a common femoral vein obstruction. Left: Limited visualization due o body habitus. There is no obvious evidence of deep vein thrombosis of the lower extremity. There is no obvious evidence of a  superficial thrombosis in the lower extremity. There is no obvious evidence of a Baker's cyst.  *See table(s) above for measurements and observations. Electronically signed by Servando Snare on 12/07/2017 at 3:20:29 PM.     EKG: Independently reviewed.  Noted to have some T wave inversions in 1 and aVL that are new.  Assessment/Plan Principal Problem:   Chest pain Active Problems:   Uncontrolled secondary diabetes with peripheral neuropathy (HCC)   Obesity, Class III, BMI 40-49.9 (morbid obesity) (HCC)   Dyslipidemia   HTN (hypertension)   CAD (coronary artery disease)   DVT (deep venous thrombosis) (HCC)   Type 2 diabetes  mellitus with vascular disease (HCC)   Depression   Cerebral embolism with cerebral infarction   ##) Coronary artery disease status post stent with chest pain: Patient's chest pain seems atypical and not completely consistent with angina.  He does however have several risk factors including prior coronary artery disease, diabetes, hyperlipidemia, obesity.  He is Artie been seen by cardiology was pending definitive evaluation with cardiac catheterization.  We will hold on heparin at this time unless troponins come back abnormal. -Continue aspirin 81 mg -Continue atorvastatin 80 mg daily -Continue clopidogrel 75 mg daily -Continue isosorbide dinitrate 30 mg twice a day - Continue carvedilol 25 mg twice a day -Pre-hydration with 75 mL's an hour of normal saline  ##) Type 2 diabetes: Patient is on significant amounts of insulin at home. -Continue Levemir 20 units nightly, patient is on 50 units nightly at home next line-hold home glipizide 10 mg twice a day -Sliding scale insulin  ##) History of DVT: Patient is now off rivaroxaban - Lower extremity ultrasound negative  ##) Hypertension: -Continue carvedilol, isosorbide dinitrate -Continue hydralazine 50 mg 3 times daily  ##) Hyperlipidemia: -Continue statin  ##) Chronic pain: -Continue duloxetine 60 mg daily   Fluids: IV rehydration prior to cardiac catheterization Electrolytes: Monitor and supplement Nutrition: N.p.o. at midnight, heart healthy low carb diet  Disposition: Pending cardiac catheterization  Prophylaxis: Enoxaparin  Full code    Cristy Folks MD [facility]   If 7PM-7AM, please contact night-coverage [email or web address] Password [facility]  12/07/2017, 4:28 PM

## 2017-12-07 NOTE — ED Notes (Signed)
ED TO INPATIENT HANDOFF REPORT  Name/Age/Gender [name] 57 y.o. male  Code Status    Code Status Orders  (From admission, onward)        Start     Ordered   12/07/17 1938  Full code  Continuous     12/07/17 1937    Code Status History    Date Active Date Inactive Code Status Order ID Comments User Context   06/29/2017 23:21 06/30/2017 17:04 Full Code [id number]  Ivor Costa, MD ED   06/17/2017 05:13 06/18/2017 22:21 Full Code [id number]  Etta Quill, DO ED   12/04/2016 02:47 12/05/2016 16:23 Full Code [id number]  Jule Ser, DO ED   05/31/2016 00:42 06/01/2016 19:38 Full Code [id number]  Rise Patience, MD ED   05/28/2014 03:37 06/01/2014 18:35 Full Code [id number]  Theressa Millard, MD Inpatient   02/03/2014 10:49 02/03/2014 17:54 Full Code [id number]  Pixie Casino, MD Inpatient      Home/SNF/Other Home  Chief Complaint Unstable angina ([place]) [I20.0]  Level of Care/Admitting Diagnosis ED Disposition    ED Disposition Condition Comment   [place]: [facility] [[id number]]  Level of Care: Telemetry [5]  Admit to tele based on following criteria: Monitor for Ischemic changes  Diagnosis: Chest pain [509326]  Admitting Physician: Cristy Folks [[id number]]  Attending Physician: Cristy Folks [[id number]]  PT Class (Do Not Modify): Observation [104]  PT Acc Code (Do Not Modify): Observation [10022]       Medical History Past Medical History:  Diagnosis Date  . Arthritis   . Back pain   . CAD (coronary artery disease)    a. s/p DES to LAD in 05/2016  . Cervical radiculopathy   . Chronic diastolic CHF (congestive heart failure) ([place])   . Chronic pain   . Depression   . DVT (deep venous thrombosis) ([place])   . Hematemesis   . Hepatic steatosis   . Hyperlipidemia   . Hypertension   . IBS (irritable bowel syndrome)   . Morbid obesity ([place])   . OSA (obstructive sleep apnea)   . Pancreatitis   . PE (pulmonary thromboembolism)  ([place])   . Peripheral neuropathy   . PUD (peptic ulcer disease)   . Renal disorder   . Stroke [facility])    a. ?details unclear - not seen on imaging when he was admitted in 05/2017 for TIA symptoms which were felt due to cervical radiculopathy.  . Thoracic aortic ectasia ([facility])    a. 4.3cm ectatic ascending thoracic aorta by CT 06/2017.   . Type 2 diabetes mellitus ([facility])     Allergies Allergies  Allergen Reactions  . Coconut Flavor [Flavoring Agent] Hives  . Coconut Oil Hives  . Ibuprofen Other (See Comments)    Made gastric ulcers worse  . Aleve [Naproxen]     DUE TO KIDNEYS    IV Location/Drains/Wounds Patient Lines/Drains/Airways Status   Active Line/Drains/Airways    Name:   Placement date:   Placement time:   Site:   Days:   Peripheral IV 12/07/17 Right Antecubital   12/07/17    1235    Antecubital   less than 1          Labs/Imaging Results for orders placed or performed during the hospital encounter of 12/07/17 (from the past 48 hour(s))  Basic metabolic panel     Status: Abnormal   Collection Time: 12/07/17 11:11 AM  Result Value Ref Range   Sodium 139  135 - 145 mmol/L   Potassium 2.8 (L) 3.5 - 5.1 mmol/L   Chloride 100 (L) 101 - 111 mmol/L   CO2 25 22 - 32 mmol/L   Glucose, Bld 220 (H) 65 - 99 mg/dL   BUN 10 6 - 20 mg/dL   Creatinine, Ser 0.72 0.61 - 1.24 mg/dL   Calcium 8.5 (L) 8.9 - 10.3 mg/dL   GFR calc non Af Amer >60 >60 mL/min   GFR calc Af Amer >60 >60 mL/min    Comment: (NOTE) The eGFR has been calculated using the CKD EPI equation. This calculation has not been validated in all clinical situations. eGFR's persistently <60 mL/min signify possible Chronic Kidney Disease.    Anion gap 14 5 - 15    Comment: Performed at [facility], [place] [address] [place], [place] [address]  CBC     Status: Abnormal   Collection Time: 12/07/17 11:11 AM  Result Value Ref Range   WBC 8.1 4.0 - 10.5 K/uL   RBC 4.08 (L) 4.22 - 5.81 MIL/uL   Hemoglobin  11.8 (L) 13.0 - 17.0 g/dL   HCT 36.5 (L) 39.0 - 52.0 %   MCV 89.5 78.0 - 100.0 fL   MCH 28.9 26.0 - 34.0 pg   MCHC 32.3 30.0 - 36.0 g/dL   RDW 14.0 11.5 - 15.5 %   Platelets 230 150 - 400 K/uL    Comment: Performed at [facility], [place] [address], [place], [place] [address]  Brain natriuretic peptide     Status: None   Collection Time: 12/07/17 11:11 AM  Result Value Ref Range   B Natriuretic Peptide 23.6 0.0 - 100.0 pg/mL    Comment: Performed at [facility], [place] [address], [place], [place] [address]  D-dimer, quantitative (not at [facility])     Status: None   Collection Time: 12/07/17 11:11 AM  Result Value Ref Range   D-Dimer, Quant 0.47 0.00 - 0.50 ug/mL-FEU    Comment: (NOTE) At the manufacturer cut-off of 0.50 ug/mL FEU, this assay has been documented to exclude PE with a sensitivity and negative predictive value of 97 to 99%.  At this time, this assay has not been approved by the FDA to exclude DVT/VTE. Results should be correlated with clinical presentation. Performed at [facility], [place] [address] [place], [place] [address]   I-stat troponin, ED     Status: None   Collection Time: 12/07/17 12:33 PM  Result Value Ref Range   Troponin i, poc 0.00 0.00 - 0.08 ng/mL   Comment 3            Comment: Due to the release kinetics of cTnI, a negative result within the first hours of the onset of symptoms does not rule out myocardial infarction with certainty. If myocardial infarction is still suspected, repeat the test at appropriate intervals.   Urine rapid drug screen (hosp performed)     Status: None   Collection Time: 12/07/17  4:33 PM  Result Value Ref Range   Opiates NONE DETECTED NONE DETECTED   Cocaine NONE DETECTED NONE DETECTED   Benzodiazepines NONE DETECTED NONE DETECTED   Amphetamines NONE DETECTED NONE DETECTED   Tetrahydrocannabinol NONE DETECTED NONE DETECTED   Barbiturates NONE DETECTED NONE DETECTED     Comment: (NOTE) DRUG SCREEN FOR MEDICAL PURPOSES ONLY.  IF CONFIRMATION IS NEEDED FOR ANY PURPOSE, NOTIFY LAB WITHIN 5 DAYS. LOWEST DETECTABLE LIMITS FOR URINE DRUG SCREEN Drug Class  Cutoff (ng/mL) Amphetamine and metabolites    1000 Barbiturate and metabolites    200 Benzodiazepine                 193 Tricyclics and metabolites     300 Opiates and metabolites        300 Cocaine and metabolites        300 THC                            50 Performed at [facility], [place] [address], [place], [place] [address]   Troponin I (q 6hr x 3)     Status: None   Collection Time: 12/07/17  5:27 PM  Result Value Ref Range   Troponin I <0.03 <0.03 ng/mL    Comment: Performed at [facility], [place] [address], [place], [place] [address]  Troponin I-serum (0, 3, 6 hours)     Status: None   Collection Time: 12/07/17  7:56 PM  Result Value Ref Range   Troponin I <0.03 <0.03 ng/mL    Comment: Performed at [facility], [place] [address] [place], [place] [address]     Pending Labs Unresulted Labs (From admission, onward)   Start     Ordered   12/08/17 0500  Lipid panel  Tomorrow morning,   R     12/07/17 1532   12/07/17 1938  HIV antibody (Routine Testing)  Once,   R     12/07/17 1937   12/07/17 1938  Troponin I-serum (0, 3, 6 hours)  Now then every 3 hours,   TIMED     12/07/17 1937   12/07/17 1800  Troponin I (q 6hr x 3)  Now then every 6 hours,   R     12/07/17 1722      Vitals/Pain Today's Vitals   12/07/17 1700 12/07/17 2001 12/07/17 2003 12/07/17 2008  BP: (!) 172/89 (!) 194/99  (!) (P) 194/99  Pulse: 68 71  (P) 70  Resp: 19   (P) 20  Temp:    (P) 98.1 F (36.7 C)  TempSrc:    (P) Oral  SpO2: 98%   (P) 97%  Weight:  (!) 319 lb 9.6 oz (145 kg)    Height:    (P) _0  (1.778 m)  PainSc:   0-No pain     Isolation Precautions No active isolations  Medications Medications  aspirin chewable  tablet 81 mg (not administered)  atorvastatin (LIPITOR) tablet 80 mg (not administered)  carvedilol (COREG) tablet 25 mg (25 mg Oral Given 12/07/17 2001)  clopidogrel (PLAVIX) tablet 75 mg (not administered)  DULoxetine (CYMBALTA) DR capsule 60 mg (60 mg Oral Given 12/07/17 2001)  famotidine (PEPCID) tablet 40 mg (not administered)  hydrALAZINE (APRESOLINE) tablet 50 mg (not administered)  insulin detemir (LEVEMIR) injection 20 Units (not administered)  isosorbide dinitrate (ISORDIL) tablet 30 mg (not administered)  acetaminophen (TYLENOL) tablet 650 mg (not administered)  ondansetron (ZOFRAN) injection 4 mg (not administered)  gi cocktail (Maalox,Lidocaine,Donnatal) (not administered)  morphine 4 MG/ML injection 2 mg (not administered)  nitroGLYCERIN (NITROGLYN) 2 % ointment 1 inch (not administered)  metoprolol tartrate (LOPRESSOR) tablet 12.5 mg (12.5 mg Oral Given 12/07/17 2002)  aspirin chewable tablet 324 mg (324 mg Oral Given 12/07/17 1301)  nitroGLYCERIN (NITROGLYN) 2 % ointment 1 inch (1 inch Topical Given 12/07/17 1301)  potassium chloride SA (K-DUR,KLOR-CON) CR tablet 40 mEq (40 mEq Oral Given  12/07/17 1348)    Mobility walks

## 2017-12-07 NOTE — Progress Notes (Addendum)
Reviewed precath fluids with Dr. Stanford Breed - 75/hr starting 6am tomorrow for 10:30 case. He also recommends to hold the patient's Lasix in prep for cath  (I clicked Dont Order on home meds). Dayna Dunn PA-C

## 2017-12-07 NOTE — ED Provider Notes (Signed)
[facility] DEPT Provider Note  CSN: [id number] Arrival date & time: 12/07/17 1051  Chief Complaint(s) Chest Pain  HPI KYPTON ELTRINGHAM is a 57 y.o. male    Chest Pain   This is a recurrent problem. Episode onset: for several yrs, but more frequent in the last 10 days. Episode frequency: intermittent. Progression since onset: fluctuating. The pain is associated with exertion and rest. The pain is present in the substernal region. The pain is moderate. The quality of the pain is described as sharp (and achy). The pain radiates to the left arm. The symptoms are aggravated by exertion. Associated symptoms include shortness of breath. Pertinent negatives include no cough, no fever, no leg pain and no lower extremity edema. Risk factors include male gender and obesity.  His past medical history is significant for CAD (with stent ), diabetes, DVT, hyperlipidemia, hypertension, PE and strokes.  Pertinent negatives for past medical history include no MI.  Procedure history is positive for cardiac catheterization.    Past Medical History Past Medical History:  Diagnosis Date  . Arthritis   . Back pain   . CAD (coronary artery disease)    a. s/p DES to LAD in 05/2016  . Cervical radiculopathy   . Chronic diastolic CHF (congestive heart failure) ([place])   . Chronic pain   . Depression   . DVT (deep venous thrombosis) ([place])   . Hematemesis   . Hepatic steatosis   . Hyperlipidemia   . Hypertension   . IBS (irritable bowel syndrome)   . Morbid obesity ([place])   . OSA (obstructive sleep apnea)   . Pancreatitis   . PE (pulmonary thromboembolism) ([place])   . Peripheral neuropathy   . PUD (peptic ulcer disease)   . Renal disorder   . Stroke [facility])    a. ?details unclear - not seen on imaging when he was admitted in 05/2017 for TIA symptoms which were felt due to cervical radiculopathy.  . Thoracic aortic ectasia (HCC)    a. 4.3cm ectatic ascending thoracic aorta by  CT 06/2017.   . Type 2 diabetes mellitus [facility])    Patient Active Problem List   Diagnosis Date Noted  . B12 deficiency 08/10/2017  . Persistent headaches 08/09/2017  . GERD (gastroesophageal reflux disease) 06/29/2017  . Left arm numbness   . Cerebral embolism with cerebral infarction 06/17/2017  . TIA (transient ischemic attack) 06/17/2017  . Chronic back pain 12/29/2016  . Depression 12/15/2016  . Vitamin D deficiency 12/09/2016  . Right hip pain 12/07/2016  . Hypokalemia 12/07/2016  . Type 2 diabetes mellitus with vascular disease ([place]) 05/31/2016  . Normocytic normochromic anemia 05/31/2016  . Chest pain 05/31/2016  . CAD (coronary artery disease) 01/06/2016  . DVT (deep venous thrombosis) ([place]) 01/06/2016  . Lactic acidosis 05/28/2014  . Nonspecific chest pain 01/29/2014  . Uncontrolled secondary diabetes with peripheral neuropathy ([place]) 01/29/2014  . Obesity, Class III, BMI 40-49.9 (morbid obesity) ([place]) 01/29/2014  . Snoring 01/29/2014  . Dyslipidemia 01/29/2014  . HTN (hypertension) 01/29/2014  . Abnormal nuclear stress test 01/29/2014   Home Medication(s) Prior to Admission medications   Medication Sig Start Date End Date Taking? Authorizing Provider  aspirin 81 MG chewable tablet Chew 1 tablet (81 mg total) by mouth daily. 06/01/16  Yes Elwin Mocha, MD  atorvastatin (LIPITOR) 80 MG tablet Take 1 tablet (80 mg total) by mouth daily. 01/26/17  Yes Strader, [place] M, PA-C  carvedilol (COREG) 25 MG tablet Take 1  tablet (25 mg total) by mouth 2 (two) times daily with a meal. 01/26/17  Yes Strader, [place] M, PA-C  clopidogrel (PLAVIX) 75 MG tablet Take 75 mg by mouth daily.   Yes [provider]  DULoxetine (CYMBALTA) 60 MG capsule Take 1 capsule (60 mg total) by mouth daily. 11/06/17  Yes Marcial Pacas, MD  famotidine (PEPCID) 40 MG tablet Take 1 tablet (40 mg total) by mouth at bedtime. Patient taking differently: Take 40 mg by mouth daily.  06/18/17  Yes Barton Dubois, MD  furosemide (LASIX) 40 MG tablet Take 1 tablet (40 mg total) by mouth 2 (two) times daily. 01/26/17  Yes Strader, [place] M, PA-C  glipiZIDE (GLUCOTROL) 10 MG tablet Take 10 mg by mouth 2 (two) times daily. 05/30/17  Yes [provider]  hydrALAZINE (APRESOLINE) 50 MG tablet Take 1 tablet (50 mg total) by mouth 3 (three) times daily. 07/11/17  Yes Barrett, Evelene Croon, PA-C  Insulin Detemir (LEVEMIR) 100 UNIT/ML Pen Inject 20 Units into the skin daily at 10 pm. Patient taking differently: Inject 50 Units daily at 10 pm into the skin.  06/18/17  Yes Barton Dubois, MD  isosorbide dinitrate (ISORDIL) 30 MG tablet Take 1 tablet (30 mg total) by mouth 2 (two) times daily. Patient taking differently: Take 30 mg by mouth every morning.  11/22/16  Yes Strader, [place] M, PA-C  LYRICA 100 MG capsule Take 100 mg by mouth 2 (two) times daily. 11/29/17  Yes [provider]  metFORMIN (GLUCOPHAGE-XR) 500 MG 24 hr tablet Take 2 tablets (1,000 mg total) by mouth 2 (two) times daily. 07/02/17  Yes Barton Dubois, MD  nitroGLYCERIN (NITROSTAT) 0.4 MG SL tablet Place 0.4 mg under the tongue every 5 (five) minutes as needed for chest pain. 12/17/16  Yes [provider]  ondansetron (ZOFRAN) 4 MG tablet Take 1 tablet (4 mg total) every 8 (eight) hours as needed by mouth for nausea or vomiting. 09/02/17  Yes Harris, Abigail, PA-C  oxyCODONE (ROXICODONE) 15 MG immediate release tablet Take 15 mg 3 (three) times daily as needed by mouth for pain.  08/28/17  Yes [provider]  pantoprazole (PROTONIX) 40 MG tablet Take 1 tablet (40 mg total) by mouth 2 (two) times daily. 06/18/17  Yes Barton Dubois, MD  potassium chloride SA (K-DUR,KLOR-CON) 20 MEQ tablet Take 20 mEq by mouth daily.   Yes [provider]  propranolol ER (INDERAL LA) 80 MG 24 hr capsule Take 1 capsule (80 mg total) by mouth at bedtime. 08/09/17  Yes Marcial Pacas, MD  sertraline (ZOLOFT) 50 MG tablet Take 50 mg by  mouth daily.   Yes [provider]  sitaGLIPtin (JANUVIA) 100 MG tablet Take 100 mg by mouth daily.   Yes [provider]  tiZANidine (ZANAFLEX) 2 MG tablet Take 2 mg by mouth every 6 (six) hours as needed for muscle spasms.   Yes [provider]  traZODone (DESYREL) 50 MG tablet Take 1 tablet (50 mg total) by mouth at bedtime. 12/15/16  Yes Burns, Arloa Koh, MD  Vitamin D, Ergocalciferol, (DRISDOL) 50000 units CAPS capsule Take 1 capsule by mouth every Monday. 11/06/17  Yes [provider]  Insulin Pen Needle 31G X 5 MM MISC Use 1 needle daily to inject insulin as prescribed 06/18/17   Barton Dubois, MD  ondansetron (ZOFRAN ODT) 4 MG disintegrating tablet Take 1 tablet (4 mg total) by mouth every 8 (eight) hours as needed for nausea or vomiting. Patient not taking:  Reported on 12/07/2017 06/09/17   Varney Biles, MD                                                                                                                                    Past Surgical History Past Surgical History:  Procedure Laterality Date  . CARDIAC CATHETERIZATION N/A 05/31/2016   Procedure: Left Heart Cath and Coronary Angiography;  Surgeon: Peter M Martinique, MD;  Location: [facility] LAB;  Service: Cardiovascular;  Laterality: N/A;  . CARDIAC CATHETERIZATION N/A 05/31/2016   Procedure: Intravascular Pressure Wire/FFR Study;  Surgeon: Peter M Martinique, MD;  Location: [facility] LAB;  Service: Cardiovascular;  Laterality: N/A;  . CARDIAC CATHETERIZATION N/A 05/31/2016   Procedure: Coronary Stent Intervention;  Surgeon: Peter M Martinique, MD;  Location: [facility];  Service: Cardiovascular;  Laterality: N/A;  . LEFT HEART CATHETERIZATION WITH CORONARY ANGIOGRAM N/A 02/03/2014   Procedure: LEFT HEART CATHETERIZATION WITH CORONARY ANGIOGRAM;  Surgeon: Pixie Casino, MD;  Location: [facility] CATH LAB;  Service: Cardiovascular;  Laterality: N/A;  . left leg stent      Family History Family  History  Problem Relation Age of Onset  . Cancer Father   . Hypertension Mother   . Diabetes Mother   . Breast cancer Mother   . Hypertension Brother   . Diabetes Brother   . Hypertension Sister   . Diabetes Sister     Social History Social History   Tobacco Use  . Smoking status: Never Smoker  . Smokeless tobacco: Never Used  Substance Use Topics  . Alcohol use: No  . Drug use: No   Allergies Coconut flavor [flavoring agent]; Coconut oil; Ibuprofen; and Aleve [naproxen]  Review of Systems Review of Systems  Constitutional: Negative for fever.  Respiratory: Positive for shortness of breath. Negative for cough.   Cardiovascular: Positive for chest pain.   All other systems are reviewed and are negative for acute change except as noted in the HPI  Physical Exam Vital Signs  I have reviewed the triage vital signs BP (!) 178/79   Pulse 70   Temp 98 F (36.7 C) (Oral)   Resp (!) 22   SpO2 94%   Physical Exam  Constitutional: He is oriented to person, place, and time. He appears well-developed and well-nourished. No distress.  obese  HENT:  Head: Normocephalic and atraumatic.  Nose: Nose normal.  Eyes: Conjunctivae and EOM are normal. Pupils are equal, round, and reactive to light. Right eye exhibits no discharge. Left eye exhibits no discharge. No scleral icterus.  Neck: Normal range of motion. Neck supple.  Cardiovascular: Normal rate and regular rhythm. Exam reveals no gallop and no friction rub.  No murmur heard. Pulmonary/Chest: Effort normal and breath sounds normal. No stridor. No respiratory distress. He has no rales.  Abdominal: Soft. He exhibits no distension. There is no tenderness.  Musculoskeletal: He exhibits no edema or  tenderness.  BLE swelling; L>R  Neurological: He is alert and oriented to person, place, and time.  Skin: Skin is warm and dry. No rash noted. He is not diaphoretic. No erythema.  Psychiatric: He has a normal mood and affect.    Vitals reviewed.   ED Results and Treatments Labs (all labs ordered are listed, but only abnormal results are displayed) Labs Reviewed  BASIC METABOLIC PANEL - Abnormal; Notable for the following components:      Result Value   Potassium 2.8 (*)    Chloride 100 (*)    Glucose, Bld 220 (*)    Calcium 8.5 (*)    All other components within normal limits  CBC - Abnormal; Notable for the following components:   RBC 4.08 (*)    Hemoglobin 11.8 (*)    HCT 36.5 (*)    All other components within normal limits  BRAIN NATRIURETIC PEPTIDE  D-DIMER, QUANTITATIVE (NOT AT [facility])  RAPID URINE DRUG SCREEN, HOSP PERFORMED  I-STAT TROPONIN, ED                                                                                                                         EKG  EKG Interpretation  Date/Time:  Thursday December 07 2017 11:13:38 EST Ventricular Rate:  70 PR Interval:    QRS Duration: 87 QT Interval:  421 QTC Calculation: 455 R Axis:   14 Text Interpretation:  Sinus rhythm Abnormal T, consider ischemia, lateral leads more prominent that previous tracings NO STEMI Confirmed by Addison Lank [phone]) on 12/07/2017 12:36:58 PM      Radiology Dg Chest 2 View  Result Date: 12/07/2017 CLINICAL DATA:  Ten days of left-sided chest pain which is been relieved with nitroglycerin. History of coronary artery disease with stent placement, diabetes, previous CVA, nonsmoker. EXAM: CHEST  2 VIEW COMPARISON:  Chest x-ray of September 04, 2017 FINDINGS: The lungs are adequately inflated. There is no focal infiltrate. There is no pleural effusion. The heart is top-normal in size. The pulmonary vascularity is normal. The mediastinum is normal in width. There is multilevel degenerative disc disease of the thoracic spine. IMPRESSION: There is no pneumonia nor other acute cardiopulmonary disease. Top-normal cardiac size. Electronically Signed   By: David  Martinique M.D.   On: 12/07/2017 11:33   Vas [place] Lower  Extremity Venous (dvt) (only [facility])  Result Date: 12/07/2017  Lower Venous Study Indication: Edema. Risk Factors: Obesity. 323 lbs Examination Guidelines: A complete evaluation includes B-mode imaging, spectral doppler, color doppler, and power doppler as needed of all accessible portions of each vessel. Bilateral testing is considered an integral part of a complete examination. Limited examinations for reoccurring indications may be performed as noted.  Right Venous Findings: +---+---------------+---------+-----------+----------+-------+    CompressibilityPhasicitySpontaneityPropertiesSummary +---+---------------+---------+-----------+----------+-------+ CFVFull           Yes      Yes                          +---+---------------+---------+-----------+----------+-------+  Left Venous Findings: +---------+---------------+---------+-----------+----------+-------------------+          CompressibilityPhasicitySpontaneityPropertiesSummary             +---------+---------------+---------+-----------+----------+-------------------+ CFV      Full           Yes      Yes                                      +---------+---------------+---------+-----------+----------+-------------------+ FV Prox  Full           Yes      Yes                                      +---------+---------------+---------+-----------+----------+-------------------+ FV Mid   Full           Yes      Yes                                      +---------+---------------+---------+-----------+----------+-------------------+ FV DistalFull           Yes      Yes                                      +---------+---------------+---------+-----------+----------+-------------------+ PFV      Full           Yes      Yes                                      +---------+---------------+---------+-----------+----------+-------------------+ POP      Full           Yes      Yes                                       +---------+---------------+---------+-----------+----------+-------------------+ PTV      Full                                                             +---------+---------------+---------+-----------+----------+-------------------+ PERO                                                  Limited                                                                   visualization due  to body habitus     +---------+---------------+---------+-----------+----------+-------------------+ Gastroc  Full                                                             +---------+---------------+---------+-----------+----------+-------------------+    Final Interpretation: Right: There is no evidence of a common femoral vein obstruction. Left: Limited visualization due o body habitus. There is no obvious evidence of deep vein thrombosis of the lower extremity. There is no obvious evidence of a superficial thrombosis in the lower extremity. There is no obvious evidence of a Baker's cyst.  *See table(s) above for measurements and observations. Electronically signed by Servando Snare on 12/07/2017 at 3:20:29 PM.  ** Final **  Pertinent labs & imaging results that were available during my care of the patient were reviewed by me and considered in my medical decision making (see chart for details).  Medications Ordered in ED Medications  aspirin chewable tablet 324 mg (324 mg Oral Given 12/07/17 1301)  nitroGLYCERIN (NITROGLYN) 2 % ointment 1 inch (1 inch Topical Given 12/07/17 1301)  potassium chloride SA (K-DUR,KLOR-CON) CR tablet 40 mEq (40 mEq Oral Given 12/07/17 1348)                                                                                                                                    Procedures Procedures  (including critical care time)  Medical Decision Making / ED Course I have reviewed the nursing notes for this  encounter and the patient's prior records (if available in EHR or on provided paperwork).    Patient is describing substernal chest pain that is concerning for unstable angina.  EKG with new T wave inversions in high lateral leads.  Initial troponin negative.  Will discuss case with cardiology for possible admission for ACS rule out if rest of the workup is negative.   Patient does have a history of DVTs and PEs but pain is not pleuritic in nature.  D-dimer was negative.  Ultrasound of the left lower extremity was obtained which rule out DVT.  Presentation is not classic for aortic dissection or esophageal perforation. Chest x-ray without evidence suggestive of pneumonia, pneumothorax, pneumomediastinum.  No abnormal contour of the mediastinum to suggest dissection. No evidence of acute injuries.  Cardiology evaluated the patient in the emergency department and recommended admission for ACS rule out.  They plan to catheterize the patient.  They requested we admit the patient to hospitalist service here at [facility].  They will continue to follow along during his admission.  Final Clinical Impression(s) / ED Diagnoses Final diagnoses:  Unstable angina ([place])      This chart was dictated using voice recognition software.  Despite best efforts to proofread,  errors can occur  which can change the documentation meaning.   Fatima Blank, MD 12/07/17 1531

## 2017-12-07 NOTE — Progress Notes (Signed)
Left lower extremity venous duplex completed. No obvious evidence of DVT, superficial thrombosis, or Baker's cyst. No changes since previous exam of 04/20/2017. [facility], [place] 12/07/2017, 2 :34 PM

## 2017-12-08 ENCOUNTER — Ambulatory Visit: Payer: Medicaid Other | Admitting: Physical Therapy

## 2017-12-08 ENCOUNTER — Telehealth: Payer: Self-pay | Admitting: Physical Therapy

## 2017-12-08 ENCOUNTER — Ambulatory Visit (HOSPITAL_COMMUNITY)
Admission: EM | Disposition: A | Discharge status: Home / Self Care | Payer: Self-pay | Source: Home / Self Care | Attending: Emergency Medicine

## 2017-12-08 DIAGNOSIS — I25118 Atherosclerotic heart disease of native coronary artery with other forms of angina pectoris: Secondary | ICD-10-CM

## 2017-12-08 DIAGNOSIS — I10 Essential (primary) hypertension: Secondary | ICD-10-CM | POA: Diagnosis not present

## 2017-12-08 DIAGNOSIS — E785 Hyperlipidemia, unspecified: Secondary | ICD-10-CM | POA: Diagnosis not present

## 2017-12-08 DIAGNOSIS — R079 Chest pain, unspecified: Secondary | ICD-10-CM | POA: Diagnosis not present

## 2017-12-08 HISTORY — PX: LEFT HEART CATH AND CORONARY ANGIOGRAPHY: CATH118249

## 2017-12-08 LAB — BASIC METABOLIC PANEL
Anion gap: 15 (ref 5–15)
BUN: 14 mg/dL (ref 6–20)
CO2: 27 mmol/L (ref 22–32)
Calcium: 8.8 mg/dL — ABNORMAL LOW (ref 8.9–10.3)
Chloride: 101 mmol/L (ref 101–111)
Creatinine, Ser: 0.66 mg/dL (ref 0.61–1.24)
GFR calc Af Amer: >60 mL/min (ref >60)
GFR calc non Af Amer: >60 mL/min (ref >60)
Glucose, Bld: 209 mg/dL — ABNORMAL HIGH (ref 65–99)
Potassium: 2.9 mmol/L — ABNORMAL LOW (ref 3.5–5.1)
Sodium: 143 mmol/L (ref 135–145)

## 2017-12-08 LAB — GLUCOSE, CAPILLARY
Glucose-Capillary: 185 mg/dL — ABNORMAL HIGH (ref 65–99)
Glucose-Capillary: 169 mg/dL — ABNORMAL HIGH (ref 65–99)

## 2017-12-08 LAB — HIV ANTIBODY (ROUTINE TESTING W REFLEX): HIV Screen 4th Generation wRfx: NONREACTIVE

## 2017-12-08 LAB — LIPID PANEL
Cholesterol: 88 mg/dL (ref 0–200)
HDL: 29 mg/dL — ABNORMAL LOW (ref >40)
LDL Cholesterol: 16 mg/dL (ref 0–99)
Total CHOL/HDL Ratio: 3 RATIO
Triglycerides: 217 mg/dL — ABNORMAL HIGH (ref <150)
VLDL: 43 mg/dL — ABNORMAL HIGH (ref 0–40)

## 2017-12-08 LAB — PROTIME-INR
INR: 1.01
Prothrombin Time: 13.2 seconds (ref 11.4–15.2)

## 2017-12-08 LAB — TROPONIN I: Troponin I: <0.03 ng/mL (ref <0.03)

## 2017-12-08 SURGERY — LEFT HEART CATH AND CORONARY ANGIOGRAPHY
Anesthesia: LOCAL

## 2017-12-08 MED ORDER — LIDOCAINE HCL (PF) 1 % IJ SOLN
INTRAMUSCULAR | Status: AC
  Filled 2017-12-08: qty 30

## 2017-12-08 MED ORDER — SODIUM CHLORIDE 0.9 % IV SOLN
INTRAVENOUS | Status: DC
  Administered 2017-12-08: 05:00:00 via INTRAVENOUS

## 2017-12-08 MED ORDER — VERAPAMIL HCL 2.5 MG/ML IV SOLN
INTRAVENOUS | Status: DC | PRN
  Administered 2017-12-08: 10 mL via INTRA_ARTERIAL

## 2017-12-08 MED ORDER — HYDRALAZINE HCL 20 MG/ML IJ SOLN
10.0000 mg | INTRAMUSCULAR | Status: DC | PRN
  Filled 2017-12-08: qty 1

## 2017-12-08 MED ORDER — SODIUM CHLORIDE 0.9 % IV SOLN: INTRAVENOUS | Status: AC

## 2017-12-08 MED ORDER — MIDAZOLAM HCL 2 MG/2ML IJ SOLN
INTRAMUSCULAR | Status: AC
  Filled 2017-12-08: qty 2

## 2017-12-08 MED ORDER — MIDAZOLAM HCL 2 MG/2ML IJ SOLN
INTRAMUSCULAR | Status: DC | PRN
  Administered 2017-12-08: 1 mg via INTRAVENOUS
  Administered 2017-12-08: 2 mg via INTRAVENOUS

## 2017-12-08 MED ORDER — FENTANYL CITRATE (PF) 100 MCG/2ML IJ SOLN
INTRAMUSCULAR | Status: DC | PRN
  Administered 2017-12-08 (×2): 50 ug via INTRAVENOUS

## 2017-12-08 MED ORDER — SODIUM CHLORIDE 0.9% FLUSH: 3.0000 mL | INTRAVENOUS | Status: DC | PRN

## 2017-12-08 MED ORDER — HEPARIN SODIUM (PORCINE) 1000 UNIT/ML IJ SOLN
INTRAMUSCULAR | Status: DC | PRN
  Administered 2017-12-08: 6000 [IU] via INTRAVENOUS

## 2017-12-08 MED ORDER — ASPIRIN 81 MG PO CHEW
81.0000 mg | CHEWABLE_TABLET | ORAL | Status: AC
  Administered 2017-12-08: 81 mg via ORAL
  Filled 2017-12-08: qty 1

## 2017-12-08 MED ORDER — VERAPAMIL HCL 2.5 MG/ML IV SOLN
INTRAVENOUS | Status: AC
  Filled 2017-12-08: qty 2

## 2017-12-08 MED ORDER — IOPAMIDOL (ISOVUE-370) INJECTION 76%
INTRAVENOUS | Status: AC
  Filled 2017-12-08: qty 100

## 2017-12-08 MED ORDER — OXYCODONE HCL 5 MG PO TABS
ORAL_TABLET | ORAL | Status: AC
  Filled 2017-12-08: qty 3

## 2017-12-08 MED ORDER — POTASSIUM CHLORIDE CRYS ER 20 MEQ PO TBCR
40.0000 meq | EXTENDED_RELEASE_TABLET | ORAL | Status: AC
Start: 2017-12-08 — End: 2017-12-08
  Administered 2017-12-08: 40 meq via ORAL
  Filled 2017-12-08: qty 2

## 2017-12-08 MED ORDER — SODIUM CHLORIDE 0.9 % IV SOLN: 250.0000 mL | INTRAVENOUS | Status: DC | PRN

## 2017-12-08 MED ORDER — CLOPIDOGREL BISULFATE 75 MG PO TABS
ORAL_TABLET | ORAL | Status: DC | PRN
  Administered 2017-12-08: 75 mg via ORAL

## 2017-12-08 MED ORDER — CLOPIDOGREL BISULFATE 75 MG PO TABS
ORAL_TABLET | ORAL | Status: AC
  Filled 2017-12-08: qty 1

## 2017-12-08 MED ORDER — OXYCODONE HCL 5 MG PO TABS
15.0000 mg | ORAL_TABLET | Freq: Three times a day (TID) | ORAL | Status: DC | PRN
  Administered 2017-12-08 – 2017-12-09 (×2): 15 mg via ORAL
  Filled 2017-12-08 (×3): qty 3

## 2017-12-08 MED ORDER — FUROSEMIDE 40 MG PO TABS: 40.0000 mg | ORAL_TABLET | Freq: Every evening | ORAL | Status: DC

## 2017-12-08 MED ORDER — ASPIRIN 81 MG PO CHEW: 81.0000 mg | CHEWABLE_TABLET | ORAL | Status: DC

## 2017-12-08 MED ORDER — POTASSIUM CHLORIDE CRYS ER 20 MEQ PO TBCR: 40.0000 meq | EXTENDED_RELEASE_TABLET | Freq: Every day | ORAL | Status: DC

## 2017-12-08 MED ORDER — SODIUM CHLORIDE 0.9% FLUSH: 3.0000 mL | Freq: Two times a day (BID) | INTRAVENOUS | Status: DC

## 2017-12-08 MED ORDER — POTASSIUM CHLORIDE IN NACL 40-0.9 MEQ/L-% IV SOLN
INTRAVENOUS | Status: AC
  Administered 2017-12-08: 75 mL/h via INTRAVENOUS
  Filled 2017-12-08: qty 1000

## 2017-12-08 MED ORDER — HEPARIN SODIUM (PORCINE) 1000 UNIT/ML IJ SOLN
INTRAMUSCULAR | Status: AC
  Filled 2017-12-08: qty 1

## 2017-12-08 MED ORDER — POTASSIUM CHLORIDE CRYS ER 20 MEQ PO TBCR: 40.0000 meq | EXTENDED_RELEASE_TABLET | Freq: Once | ORAL | Status: DC

## 2017-12-08 MED ORDER — SODIUM CHLORIDE 0.9% FLUSH
3.0000 mL | Freq: Two times a day (BID) | INTRAVENOUS | Status: DC
  Administered 2017-12-08: 3 mL via INTRAVENOUS

## 2017-12-08 MED ORDER — HEPARIN (PORCINE) IN NACL 2-0.9 UNIT/ML-% IJ SOLN
INTRAMUSCULAR | Status: AC | PRN
  Administered 2017-12-08 (×2): 500 mL via INTRA_ARTERIAL

## 2017-12-08 MED ORDER — FUROSEMIDE 40 MG PO TABS
80.0000 mg | ORAL_TABLET | Freq: Every day | ORAL | Status: DC
  Administered 2017-12-08 – 2017-12-09 (×2): 80 mg via ORAL
  Filled 2017-12-08 (×2): qty 2

## 2017-12-08 MED ORDER — POTASSIUM CHLORIDE CRYS ER 20 MEQ PO TBCR
EXTENDED_RELEASE_TABLET | ORAL | Status: AC
  Filled 2017-12-08: qty 1

## 2017-12-08 MED ORDER — FENTANYL CITRATE (PF) 100 MCG/2ML IJ SOLN
INTRAMUSCULAR | Status: AC
  Filled 2017-12-08: qty 2

## 2017-12-08 MED ORDER — HEPARIN (PORCINE) IN NACL 2-0.9 UNIT/ML-% IJ SOLN
INTRAMUSCULAR | Status: AC
  Filled 2017-12-08: qty 1000

## 2017-12-08 MED ORDER — POTASSIUM CHLORIDE CRYS ER 20 MEQ PO TBCR
40.0000 meq | EXTENDED_RELEASE_TABLET | Freq: Two times a day (BID) | ORAL | Status: DC
  Administered 2017-12-08 – 2017-12-09 (×2): 40 meq via ORAL
  Filled 2017-12-08 (×2): qty 2

## 2017-12-08 MED ORDER — LIDOCAINE HCL (PF) 1 % IJ SOLN
INTRAMUSCULAR | Status: DC | PRN
  Administered 2017-12-08: 2 mL via INTRADERMAL

## 2017-12-08 MED ORDER — POTASSIUM CHLORIDE CRYS ER 20 MEQ PO TBCR
20.0000 meq | EXTENDED_RELEASE_TABLET | Freq: Once | ORAL | Status: AC
  Administered 2017-12-08: 20 meq via ORAL

## 2017-12-08 MED ORDER — IOPAMIDOL (ISOVUE-370) INJECTION 76%
INTRAVENOUS | Status: DC | PRN
  Administered 2017-12-08: 95 mL via INTRA_ARTERIAL

## 2017-12-08 SURGICAL SUPPLY — 11 items
CATH OPTITORQUE TIG 4.0 5F (CATHETERS) ×2 IMPLANT
COVER PRB 48X5XTLSCP FOLD TPE (BAG) ×1 IMPLANT
COVER PROBE 5X48 (BAG) ×1
DEVICE RAD COMP TR BAND LRG (VASCULAR PRODUCTS) ×2 IMPLANT
GLIDESHEATH SLEND SS 6F .021 (SHEATH) ×2 IMPLANT
GUIDEWIRE INQWIRE 1.5J.035X260 (WIRE) ×1 IMPLANT
INQWIRE 1.5J .035X260CM (WIRE) ×2
KIT HEART LEFT (KITS) ×2 IMPLANT
PACK CARDIAC CATHETERIZATION (CUSTOM PROCEDURE TRAY) ×2 IMPLANT
TRANSDUCER W/STOPCOCK (MISCELLANEOUS) ×2 IMPLANT
TUBING CIL FLEX 10 FLL-RA (TUBING) ×2 IMPLANT

## 2017-12-08 NOTE — Progress Notes (Addendum)
Progress Note  Patient Name: [name] Date of Encounter: 12/08/2017  Primary Cardiologist: Previously Dr. Stanford Breed, now [place]  Subjective   No CP this AM, only mild headache.  Inpatient Medications    Scheduled Meds: . aspirin  81 mg Oral Daily  . aspirin  81 mg Oral Pre-Cath  . atorvastatin  80 mg Oral Daily  . carvedilol  25 mg Oral BID WC  . clopidogrel  75 mg Oral Daily  . DULoxetine  60 mg Oral Daily  . famotidine  40 mg Oral QHS  . hydrALAZINE  50 mg Oral TID  . insulin detemir  20 Units Subcutaneous Q2200  . isosorbide dinitrate  30 mg Oral q morning - 10a  . metoprolol tartrate  12.5 mg Oral BID  . potassium chloride  40 mEq Oral Q4H  . sodium chloride flush  3 mL Intravenous Q12H   Continuous Infusions: . sodium chloride    . sodium chloride 75 mL/hr at 12/08/17 0459  . 0.9 % NaCl with KCl 40 mEq / L 75 mL/hr (12/08/17 0339)   PRN Meds: sodium chloride, acetaminophen, gi cocktail, hydrALAZINE, morphine injection, ondansetron (ZOFRAN) IV, sodium chloride flush   Vital Signs    Vitals:   12/07/17 2001 12/07/17 2008 12/07/17 2120 12/08/17 0524  BP: (!) 194/99 (!) 194/99 (!) 153/74 (!) 167/80  Pulse: 71 70 67 65  Resp:  20 18 16   Temp:  98.1 F (36.7 C)  98 F (36.7 C)  TempSrc:  Oral  Oral  SpO2:  97%  96%  Weight: (!) 319 lb 9.6 oz (145 kg)     Height:  5\' 10"  (1.778 m)      Intake/Output Summary (Last 24 hours) at 12/08/2017 0755 Last data filed at 12/08/2017 0200 Gross per 24 hour  Intake 0 ml  Output -  Net 0 ml   Filed Weights   12/07/17 2001  Weight: (!) 319 lb 9.6 oz (145 kg)    Telemetry    NSR occasional variability in HR with difficult to see p wave amplitude but still apparent P wave activity - Personally Reviewed  Physical Exam   GEN: Morbidly obese, no acute distress.  HEENT: Normocephalic, atraumatic, sclera non-icteric. Neck: No JVD or bruits. Cardiac: RRR no murmurs, rubs, or gallops.  Radials/DP/PT 1+ and  equal bilaterally.  Respiratory: Clear to auscultation bilaterally. Breathing is unlabored. GI: Soft, nontender, non-distended, BS +x 4. MS: no deformity. Extremities: No clubbing or cyanosis. Woody appearing chronic LEE superimposed on baseline obese leg habitus.  Neuro:  AAOx3. Follows commands. Psych:  Responds to questions appropriately with a normal affect.  Labs    Chemistry Recent Labs  Lab 12/07/17 1111 12/08/17 0359  NA 139 143  K 2.8* 2.9*  CL 100* 101  CO2 25 27  GLUCOSE 220* 209*  BUN 10 14  CREATININE 0.72 0.66  CALCIUM 8.5* 8.8*  GFRNONAA >60 >60  GFRAA >60 >60  ANIONGAP 14 15     Hematology Recent Labs  Lab 12/07/17 1111  WBC 8.1  RBC 4.08*  HGB 11.8*  HCT 36.5*  MCV 89.5  MCH 28.9  MCHC 32.3  RDW 14.0  PLT 230    Cardiac Enzymes Recent Labs  Lab 12/07/17 1727 12/07/17 1956 12/08/17 0212  TROPONINI <0.03 <0.03 <0.03    Recent Labs  Lab 12/07/17 1233  TROPIPOC 0.00     BNP Recent Labs  Lab 12/07/17 1111  BNP 23.6     DDimer  Recent Labs  Lab 12/07/17 1111  DDIMER 0.47     Radiology    Dg Chest 2 View  Result Date: 12/07/2017 CLINICAL DATA:  Ten days of left-sided chest pain which is been relieved with nitroglycerin. History of coronary artery disease with stent placement, diabetes, previous CVA, nonsmoker. EXAM: CHEST  2 VIEW COMPARISON:  Chest x-ray of September 04, 2017 FINDINGS: The lungs are adequately inflated. There is no focal infiltrate. There is no pleural effusion. The heart is top-normal in size. The pulmonary vascularity is normal. The mediastinum is normal in width. There is multilevel degenerative disc disease of the thoracic spine. IMPRESSION: There is no pneumonia nor other acute cardiopulmonary disease. Top-normal cardiac size. Electronically Signed   By: Zavannah Deblois  [place] M.D.   On: 12/07/2017 11:33   Vas [place] Lower Extremity Venous (dvt) (only Mc & Wl)  Result Date: 12/07/2017  Lower Venous Study Indication:  Edema. Risk Factors: Obesity. 323 lbs Examination Guidelines: A complete evaluation includes B-mode imaging, spectral doppler, color doppler, and power doppler as needed of all accessible portions of each vessel. Bilateral testing is considered an integral part of a complete examination. Limited examinations for reoccurring indications may be performed as noted.  Right Venous Findings: +---+---------------+---------+-----------+----------+-------+    CompressibilityPhasicitySpontaneityPropertiesSummary +---+---------------+---------+-----------+----------+-------+ CFVFull           Yes      Yes                          +---+---------------+---------+-----------+----------+-------+  Left Venous Findings: +---------+---------------+---------+-----------+----------+-------------------+          CompressibilityPhasicitySpontaneityPropertiesSummary             +---------+---------------+---------+-----------+----------+-------------------+ CFV      Full           Yes      Yes                                      +---------+---------------+---------+-----------+----------+-------------------+ FV Prox  Full           Yes      Yes                                      +---------+---------------+---------+-----------+----------+-------------------+ FV Mid   Full           Yes      Yes                                      +---------+---------------+---------+-----------+----------+-------------------+ FV DistalFull           Yes      Yes                                      +---------+---------------+---------+-----------+----------+-------------------+ PFV      Full           Yes      Yes                                      +---------+---------------+---------+-----------+----------+-------------------+ POP      Full  Yes      Yes                                      +---------+---------------+---------+-----------+----------+-------------------+ PTV       Full                                                             +---------+---------------+---------+-----------+----------+-------------------+ PERO                                                  Limited                                                                   visualization due                                                         to body habitus     +---------+---------------+---------+-----------+----------+-------------------+ Gastroc  Full                                                             +---------+---------------+---------+-----------+----------+-------------------+    Final Interpretation: Right: There is no evidence of a common femoral vein obstruction. Left: Limited visualization due o body habitus. There is no obvious evidence of deep vein thrombosis of the lower extremity. There is no obvious evidence of a superficial thrombosis in the lower extremity. There is no obvious evidence of a Baker's cyst.  *See table(s) above for measurements and observations. Electronically signed by Servando Snare on 12/07/2017 at 3:20:29 PM.   Final    Cardiac Studies   2D Echo 06/2017 Study Conclusions - Left ventricle: The cavity size was normal. Wall thickness was   increased in a pattern of mild LVH. Systolic function was normal.   The estimated ejection fraction was in the range of 50% to 55%.   Wall motion was normal; there were no regional wall motion   abnormalities. Features are consistent with a pseudonormal left   ventricular filling pattern, with concomitant abnormal relaxation   and increased filling pressure (grade 2 diastolic dysfunction). - Aortic valve: Valve area (VTI): 2.5 cm^2. Valve area (Vmax): 2.33   cm^2. Valve area (Vmean): 2.28 cm^2.   Patient Profile     57 y.o. 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 Leonette Monarch.  Assessment & Plan    1. Chest pain with mixed typical/atypical features - plan cath today for definitive eval. Ruled out for MI.  2. Chronic dyspnea, likely multifactorial, compounded by severe obesity with untreated OSA - cath as above. Importance of long-term weight loss reviewed with patient. Also reinforced importance of CPAP.  3. Chronic diastolic CHF - weight is actually down a few lbs from baseline. Volume status nearly impossible to assess given habitus. CXR was nonacute. He reports LEE flucutates. Prior dietary noncompliance noted in chart. Anticipate LVEDP will be evaluated by cath. Lasix on hold today for cath; recommend resumption post-cath if renal status stable.  4. Hypokalemia - frequently hypokalemic. Likely needs more standing repletion than home regimen. Early cardiology APP has already put in 72meq po x 2. Will increase standing repletion to 78meq daily. Placed order for renin/aldosterone eval with next labs in AM. If he ends up going home today post-cath, this can be evaluated as OP.  5. HTN - poor control today in ER but questionable compliance. Not clear to me why IM ordered metoprolol in addition to his home carvedilol; will hold Lopressor. Continue hydralazine and nitrates. Not clear why he is not on ACEI/ARB in setting of his DM but this can be reviewed as OP by PCP. He receives care at multiple institutions making it challenging to track down med changes.  6. DM - primary team has adjusted meds to allow for NPO status.  For questions or updates, please contact [facility] Please consult  [email or web address] for contact info under Cardiology/STEMI.  Signed, Charlie Pitter, PA-C 12/08/2017, 7:55 AM    I saw and evaluated the patient this morning in the Cath Lab after being seen by Sharrell Ku, PA-C over [facility].  The patient is not having any more chest pain.  Plan for today is left heart catheterization to be performed by me. He was given potassium 4 hypokalemia we will recheck in the Cath Lab. He seems euvolemic lying comfortably flat on the table. Need to ensure compliance with medical management of home blood pressure and cholesterol medications prior to discharge.  More plans per cath note.   Glenetta Hew, M.D., M.S. Interventional Cardiologist   Pager # [phone] Phone # [phone] [address] [place], [place] [address]

## 2017-12-08 NOTE — Telephone Encounter (Signed)
Pt currently being seen at [facility] for decreased balance and gait instability. On chart review for today's note it was discovered he is currently admitted to hospital for work up due to chest pain. Pt will need MD orders okaying him to resume PT upon discharge before outpatient PT can resume treating him. Attempted to call pt regarding this and unable to reach him.   Willow Ora, PTA, [place] [address], [place], [place] [address] [phone] 12/08/17, 3:48 PM

## 2017-12-08 NOTE — Progress Notes (Signed)
Inpatient Diabetes Program Recommendations  AACE/ADA: New Consensus Statement on Inpatient Glycemic Control (2015)  Target Ranges:  Prepandial:   less than 140 mg/dL      Peak postprandial:   less than 180 mg/dL (1-2 hours)      Critically ill patients:  140 - 180 mg/dL   Lab Results  Component Value Date   GLUCAP 199 (H) 08/04/2017   HGBA1C 8.9 (H) 11/06/2017   Review of Glycemic Control  Diabetes history: DM 2 Outpatient Diabetes medications: Levemir 50 units Daily, Metformin 1000 mg BID, Glipizide 10 mg BID, Januvia 100 mg Daily Current orders for Inpatient glycemic control: Levemir 20 units QHS  Inpatient Diabetes Program Recommendations:    Consider Novolog Moderate Correction 0-15 units tid + Novolog HS scale while here in addition to Levemir already prescribed.  Thanks,  Tama Headings RN, MSN, [facility] Inpatient Diabetes Coordinator Team Pager [phone] (8a-5p)

## 2017-12-08 NOTE — Progress Notes (Signed)
Pt BP 167/80. No PRN orders at this time. MD on call notified. Will continue to monitor.

## 2017-12-08 NOTE — Progress Notes (Addendum)
Per discussion with Dr. Ellyn Hack, re-initiate Lasix at slightly higher dose than home dose (oral form). As previously stated, compliance has been in question. He was supposed to be on 40mg  BID but reported occasionally missing doses when he had doctor's or other appointments. Per MD discussion will start 80mg  QAM/40mg  QPM. Recommend following BP and diuretic response today with possible DC in AM. Also need to consider ACEI/ARB at dc unless there is another reason why he's not previously been on one. Repeat potassium around 4pm to help guide further repletion (received IVF with KCl per IM overnight and also 75meq po x 2 prior to cath). Will start standing repletion in AM at 66meq BID.  Dayna Dunn PA-C

## 2017-12-08 NOTE — Progress Notes (Signed)
TR BAND REMOVAL  LOCATION:    Radial rt radial  DEFLATED PER PROTOCOL:   yes  TIME BAND OFF / DRESSING APPLIED:    1300/ gauze and tegaderm  SITE UPON ARRIVAL:    Level 0  SITE AFTER BAND REMOVAL:    Level 0  CIRCULATION SENSATION AND MOVEMENT:    Within Normal Limits : yes; rt hand and fingers warm and pink; sensation present  COMMENTS:   Instructions reviewed w/patient

## 2017-12-08 NOTE — Progress Notes (Signed)
Pt have [facility]. A call was made to [facility] to confirm services. Pt have PCS only, with [facility]. There is no need to have an order to resume PCS.

## 2017-12-08 NOTE — Progress Notes (Signed)
PROGRESS NOTE    [name]  ZSW:[id number] DOB: [birth year] DOA: 12/07/2017 PCP: Rogers Blocker, MD    Brief Narrative:  57 y.o. male with medical history significant of coronary artery disease status post drug-eluting stent to LAD in August 2017, hypertension, hyperlipidemia, type 2 diabetes, likely provoked DVT, obstructive sleep apnea, TIAs, diastolic heart failure, anemia who comes in with chest pain.  Patient has had approximately 15 ER visits in the past year for chest pain.    Assessment & Plan:   Principal Problem:   Chest pain - continue supportive therapy - cardiology assisting. Pt had heart cath, no stents placed but plan is to optimize medical management.  Active Problems:   Uncontrolled secondary diabetes with peripheral neuropathy (HCC) - Pt on Levemir - diabetic diet.    Obesity, Class III, BMI 40-49.9 (morbid obesity) (HCC)   Dyslipidemia - Pt on statin    HTN (hypertension) - pt on carvedilol, isordil, hydralazine,     CAD (coronary artery disease) - pt on statin, aspirin, and plavix.    DVT (deep venous thrombosis) (HCC)    Depression - Pt on cymbalta    Cerebral embolism with cerebral infarction - pt on statin, asa, plavix   DVT prophylaxis:  Early ambulation Code Status: Full Family Communication: d/c patient directly Disposition Plan: once cleared by oncology  Consultants:  Cardiology   Procedures: cardiac cath   Antimicrobials: none   Subjective: Pt has no new complaints. Looking forward to going home soon. Denies any chest pain.  Objective: Vitals:   12/08/17 1355 12/08/17 1425 12/08/17 1555 12/08/17 1600  BP: (!) 157/89 (!) 147/78 (!) 169/88 (!) 164/94  Pulse: 66 63 (!) 58 60  Resp: 18 11    Temp:   97.6 F (36.4 C)   TempSrc:   Oral   SpO2: 95% 95% 92%   Weight:      Height:        Intake/Output Summary (Last 24 hours) at 12/08/2017 1733 Last data filed at 12/08/2017 0200 Gross per 24 hour  Intake 0 ml  Output -    Net 0 ml   Filed Weights   12/07/17 2001  Weight: (!) 145 kg (319 lb 9.6 oz)    Examination:  General exam: Appears calm and comfortable, in nad. Respiratory system: Clear to auscultation. Respiratory effort normal, no wheezes Cardiovascular system: S1 & S2 heard, RRR. No JVD, murmurs, rubs, gallops  Gastrointestinal system: Abdomen is nondistended, soft and nontender. No organomegaly or masses felt. Normal bowel sounds heard. Central nervous system: Alert and oriented. No focal neurological deficits. Extremities: Symmetric 5 x 5 power. Skin: No rashes, lesions or ulcers, on limited exam. Psychiatry: Judgement and insight appear normal. Mood & affect appropriate.   Data Reviewed: I have personally reviewed following labs and imaging studies  CBC: Recent Labs  Lab 12/07/17 1111  WBC 8.1  HGB 11.8*  HCT 36.5*  MCV 89.5  PLT 322   Basic Metabolic Panel: Recent Labs  Lab 12/07/17 1111 12/08/17 0359  NA 139 143  K 2.8* 2.9*  CL 100* 101  CO2 25 27  GLUCOSE 220* 209*  BUN 10 14  CREATININE 0.72 0.66  CALCIUM 8.5* 8.8*   GFR: Estimated Creatinine Clearance: 152 mL/min (by C-G formula based on SCr of 0.66 mg/dL). Liver Function Tests: No results for input(s): AST, ALT, ALKPHOS, BILITOT, PROT, ALBUMIN in the last 168 hours. No results for input(s): LIPASE, AMYLASE in the last 168 hours. No results for  input(s): AMMONIA in the last 168 hours. Coagulation Profile: Recent Labs  Lab 12/08/17 0359  INR 1.01   Cardiac Enzymes: Recent Labs  Lab 12/07/17 1727 12/07/17 1956 12/08/17 0212  TROPONINI <0.03 <0.03 <0.03   BNP (last 3 results) No results for input(s): PROBNP in the last 8760 hours. HbA1C: No results for input(s): HGBA1C in the last 72 hours. CBG: Recent Labs  Lab 12/08/17 0903 12/08/17 1128  GLUCAP 185* 169*   Lipid Profile: Recent Labs    12/08/17 0212  CHOL 88  HDL 29*  LDLCALC 16  TRIG 217*  CHOLHDL 3.0   Thyroid Function Tests: No  results for input(s): TSH, T4TOTAL, FREET4, T3FREE, THYROIDAB in the last 72 hours. Anemia Panel: No results for input(s): VITAMINB12, FOLATE, FERRITIN, TIBC, IRON, RETICCTPCT in the last 72 hours. Sepsis Labs: No results for input(s): PROCALCITON, LATICACIDVEN in the last 168 hours.  No results found for this or any previous visit (from the past 240 hour(s)).       Radiology Studies: Dg Chest 2 View  Result Date: 12/07/2017 CLINICAL DATA:  Ten days of left-sided chest pain which is been relieved with nitroglycerin. History of coronary artery disease with stent placement, diabetes, previous CVA, nonsmoker. EXAM: CHEST  2 VIEW COMPARISON:  Chest x-ray of September 04, 2017 FINDINGS: The lungs are adequately inflated. There is no focal infiltrate. There is no pleural effusion. The heart is top-normal in size. The pulmonary vascularity is normal. The mediastinum is normal in width. There is multilevel degenerative disc disease of the thoracic spine. IMPRESSION: There is no pneumonia nor other acute cardiopulmonary disease. Top-normal cardiac size. Electronically Signed   By: David  Martinique M.D.   On: 12/07/2017 11:33   Vas [place] Lower Extremity Venous (dvt) (only Mc & Wl)  Result Date: 12/07/2017  Lower Venous Study Indication: Edema. Risk Factors: Obesity. 323 lbs Examination Guidelines: A complete evaluation includes B-mode imaging, spectral doppler, color doppler, and power doppler as needed of all accessible portions of each vessel. Bilateral testing is considered an integral part of a complete examination. Limited examinations for reoccurring indications may be performed as noted.  Right Venous Findings: +---+---------------+---------+-----------+----------+-------+    CompressibilityPhasicitySpontaneityPropertiesSummary +---+---------------+---------+-----------+----------+-------+ CFVFull           Yes      Yes                           +---+---------------+---------+-----------+----------+-------+  Left Venous Findings: +---------+---------------+---------+-----------+----------+-------------------+          CompressibilityPhasicitySpontaneityPropertiesSummary             +---------+---------------+---------+-----------+----------+-------------------+ CFV      Full           Yes      Yes                                      +---------+---------------+---------+-----------+----------+-------------------+ FV Prox  Full           Yes      Yes                                      +---------+---------------+---------+-----------+----------+-------------------+ FV Mid   Full           Yes      Yes                                      +---------+---------------+---------+-----------+----------+-------------------+  FV DistalFull           Yes      Yes                                      +---------+---------------+---------+-----------+----------+-------------------+ PFV      Full           Yes      Yes                                      +---------+---------------+---------+-----------+----------+-------------------+ POP      Full           Yes      Yes                                      +---------+---------------+---------+-----------+----------+-------------------+ PTV      Full                                                             +---------+---------------+---------+-----------+----------+-------------------+ PERO                                                  Limited                                                                   visualization due                                                         to body habitus     +---------+---------------+---------+-----------+----------+-------------------+ Gastroc  Full                                                             +---------+---------------+---------+-----------+----------+-------------------+     Final Interpretation: Right: There is no evidence of a common femoral vein obstruction. Left: Limited visualization due o body habitus. There is no obvious evidence of deep vein thrombosis of the lower extremity. There is no obvious evidence of a superficial thrombosis in the lower extremity. There is no obvious evidence of a Baker's cyst.  *See table(s) above for measurements and observations. Electronically signed by Servando Snare on 12/07/2017 at 3:20:29 PM.         Scheduled Meds: . aspirin  81 mg Oral Daily  . atorvastatin  80 mg Oral Daily  .  carvedilol  25 mg Oral BID WC  . clopidogrel  75 mg Oral Daily  . DULoxetine  60 mg Oral Daily  . famotidine  40 mg Oral QHS  . furosemide  80 mg Oral Daily   And  . [START ON 12/09/2017] furosemide  40 mg Oral QPM  . hydrALAZINE  50 mg Oral TID  . insulin detemir  20 Units Subcutaneous Q2200  . isosorbide dinitrate  30 mg Oral q morning - 10a  . potassium chloride SA  40 mEq Oral BID  . sodium chloride flush  3 mL Intravenous Q12H   Continuous Infusions: . sodium chloride       LOS: 0 days    Time spent: > 35 minutes  Velvet Bathe, MD [facility] Pager [phone]  If 7PM-7AM, please contact night-coverage [email or web address] Password [facility] 12/08/2017, 5:33 PM

## 2017-12-08 NOTE — Interval H&P Note (Signed)
History and Physical Interval Note:  12/08/2017 10:27 AM  [name]  has presented today for surgery, with the diagnosis of cp- concerning for progressive angina.  The various methods of treatment have been discussed with the patient and family. After consideration of risks, benefits and other options for treatment, the patient has consented to  Procedure(s): LEFT HEART CATH AND CORONARY ANGIOGRAPHY (N/A) with possible PERCUTANEOUS CORONARY INTERVENTION as a surgical intervention .  The patient's history has been reviewed, patient examined, no change in status, stable for surgery.  I have reviewed the patient's chart and labs.  Questions were answered to the patient's satisfaction.    Cath Lab Visit (complete for each Cath Lab visit)  Clinical Evaluation Leading to the Procedure:   ACS: Yes.   r/o MI - but + Progressive Angina  Non-ACS:    Anginal Classification: CCS III  Anti-ischemic medical therapy: Maximal Therapy (2 or more classes of medications)  Non-Invasive Test Results: No non-invasive testing performed  Prior CABG: No previous CABG    Glenetta Hew

## 2017-12-09 DIAGNOSIS — R079 Chest pain, unspecified: Secondary | ICD-10-CM | POA: Diagnosis not present

## 2017-12-09 DIAGNOSIS — I200 Unstable angina: Secondary | ICD-10-CM

## 2017-12-09 DIAGNOSIS — I5033 Acute on chronic diastolic (congestive) heart failure: Secondary | ICD-10-CM | POA: Diagnosis not present

## 2017-12-09 DIAGNOSIS — I10 Essential (primary) hypertension: Secondary | ICD-10-CM | POA: Diagnosis not present

## 2017-12-09 DIAGNOSIS — E1159 Type 2 diabetes mellitus with other circulatory complications: Secondary | ICD-10-CM | POA: Diagnosis not present

## 2017-12-09 DIAGNOSIS — I2510 Atherosclerotic heart disease of native coronary artery without angina pectoris: Secondary | ICD-10-CM

## 2017-12-09 DIAGNOSIS — E785 Hyperlipidemia, unspecified: Secondary | ICD-10-CM | POA: Diagnosis not present

## 2017-12-09 LAB — BASIC METABOLIC PANEL
Anion gap: 14 (ref 5–15)
BUN: 9 mg/dL (ref 6–20)
CO2: 28 mmol/L (ref 22–32)
Calcium: 9 mg/dL (ref 8.9–10.3)
Chloride: 99 mmol/L — ABNORMAL LOW (ref 101–111)
Creatinine, Ser: 0.63 mg/dL (ref 0.61–1.24)
GFR calc Af Amer: >60 mL/min (ref >60)
GFR calc non Af Amer: >60 mL/min (ref >60)
Glucose, Bld: 213 mg/dL — ABNORMAL HIGH (ref 65–99)
Potassium: 3.5 mmol/L (ref 3.5–5.1)
Sodium: 141 mmol/L (ref 135–145)

## 2017-12-09 LAB — GLUCOSE, CAPILLARY: Glucose-Capillary: 206 mg/dL — ABNORMAL HIGH (ref 65–99)

## 2017-12-09 MED ORDER — FUROSEMIDE 80 MG PO TABS: 80.0000 mg | ORAL_TABLET | Freq: Every day | ORAL | Status: DC

## 2017-12-09 MED ORDER — ISOSORBIDE DINITRATE 30 MG PO TABS: 30.0000 mg | ORAL_TABLET | Freq: Every morning | ORAL | 0 refills | Status: DC

## 2017-12-09 MED ORDER — FUROSEMIDE 40 MG PO TABS: 40.0000 mg | ORAL_TABLET | Freq: Every evening | ORAL | Status: DC

## 2017-12-09 MED ORDER — LISINOPRIL 10 MG PO TABS: 10.0000 mg | ORAL_TABLET | Freq: Every day | ORAL | 0 refills | Status: DC

## 2017-12-09 MED ORDER — LISINOPRIL 10 MG PO TABS
10.0000 mg | ORAL_TABLET | Freq: Every day | ORAL | Status: DC
  Administered 2017-12-09: 10 mg via ORAL
  Filled 2017-12-09: qty 1

## 2017-12-09 NOTE — Discharge Summary (Signed)
Physician Discharge Summary  [name]:[id number] DOB: 06/22/1961 DOA: 12/07/2017  PCP: Rogers Blocker, MD  Admit date: 12/07/2017 Discharge date: 12/09/2017  Time spent: > 35 minutes  Recommendations for Outpatient Follow-up:  1.    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  Filed Weights   12/07/17 2001  Weight: (!) 145 kg (319 lb 9.6 oz)    History of present illness:  57 y.o. male withPUD2005, CAD (s/p DES to LAD 05/2016), frequent ED visits, morbid obesity, cervical radiculopathy, HTN, HLD, DM2,peripheral neuropathy/chronic pain, OSA, DVT?2016-2017 in [place] (on Xarelto for several months per pt report), reported hx of stroke (not seen on imaging during adm for TIA symptoms 05/2017 felt more likely due to cervical radiculopathy),chronic dCHF,multifactorial/B12 deficient anemia,depression, 4.3cmthoracic aorta by CT 9/2018who is being seen today for the evaluation ofchest pain  Hospital Course:  1.  Chest pain - this has resolved.   - cath showed patent prox to mid LAD stent, 20% prox RCA, normal LVF and elevated LVEDP at 23mmHg.   - it was felt patient's CP secondary to elevated BP and LVEDP and Lasix increased.   - continue ASA, Plavix, long acting nitrate, statin and BB.    2.  Acute on chronic diastolic CHF  - I&O's and daily weights are incomplete - creatinine stable at 0.63.   - continue Lasix 80mg  qam and 40mg  qpm  3.  HTN - BP remains elevated - continue Hydralazine 50mg  TID, Imdur 30mg  daily, carvedilol  25mg  BID - add Lisinopril 10mg  daily  4.  Hypokalemia - Low normal on last check. - pt will be discharge on supplemental K regimen  Agree with plan listed above.  Procedures:  None  Consultations:  cardiology  Discharge Exam: Vitals:   12/09/17 0447 12/09/17 0949  BP: (!) 170/90 (!) 153/78  Pulse: 66   Resp: 16   Temp: 98.4 F (36.9 C)   SpO2: 93%     General: Pt in nad, alert and awake Cardiovascular: rrr, no rubs Respiratory: no increased wob, no wheezes  Discharge Instructions   Discharge Instructions    Call MD for:  extreme fatigue   Complete by:  As directed    Call MD for:  severe uncontrolled pain   Complete by:  As directed    Call MD for:  temperature >100.4   Complete by:  As directed    Diet - low sodium heart healthy   Complete by:  As directed    Discharge instructions   Complete by:  As directed    Please follow up with your cardiologist for further evaluation and recommendations.   Increase activity slowly   Complete by:  As directed      Allergies as of 12/09/2017      Reactions   Coconut Flavor [flavoring Agent] Hives   Coconut Oil Hives   Ibuprofen Other (See Comments)   Made gastric ulcers worse   Aleve [naproxen]    DUE TO KIDNEYS      Medication List    STOP taking these medications  propranolol ER 80 MG 24 hr capsule Commonly known as:  INDERAL LA     TAKE these medications   aspirin 81 MG chewable tablet Chew 1 tablet (81 mg total) by mouth daily.   atorvastatin 80 MG tablet Commonly known as:  LIPITOR Take 1 tablet (80 mg total) by mouth daily.   carvedilol 25 MG tablet Commonly known as:  COREG Take 1 tablet (25 mg total) by mouth 2 (two) times daily with a meal.   clopidogrel 75 MG tablet Commonly known as:  PLAVIX Take 75 mg by mouth daily.   DULoxetine 60 MG capsule Commonly known as:  CYMBALTA Take 1 capsule (60 mg total) by mouth daily.   famotidine 40 MG tablet Commonly known as:  PEPCID Take 1  tablet (40 mg total) by mouth at bedtime. What changed:  when to take this   furosemide 40 MG tablet Commonly known as:  LASIX Take 1 tablet (40 mg total) by mouth every evening. What changed:  You were already taking a medication with the same name, and this prescription was added. Make sure you understand how and when to take each.   furosemide 80 MG tablet Commonly known as:  LASIX Take 1 tablet (80 mg total) by mouth daily. Start taking on:  12/10/2017 What changed:    medication strength  how much to take  when to take this   glipiZIDE 10 MG tablet Commonly known as:  GLUCOTROL Take 10 mg by mouth 2 (two) times daily.   hydrALAZINE 50 MG tablet Commonly known as:  APRESOLINE Take 1 tablet (50 mg total) by mouth 3 (three) times daily.   Insulin Detemir 100 UNIT/ML Pen Commonly known as:  LEVEMIR Inject 20 Units into the skin daily at 10 pm. What changed:  how much to take   Insulin Pen Needle 31G X 5 MM Misc Use 1 needle daily to inject insulin as prescribed   isosorbide dinitrate 30 MG tablet Commonly known as:  ISORDIL Take 1 tablet (30 mg total) by mouth every morning. Start taking on:  12/10/2017   lisinopril 10 MG tablet Commonly known as:  PRINIVIL,ZESTRIL Take 1 tablet (10 mg total) by mouth daily. Start taking on:  12/10/2017   LYRICA 100 MG capsule Generic drug:  pregabalin Take 100 mg by mouth 2 (two) times daily.   metFORMIN 500 MG 24 hr tablet Commonly known as:  GLUCOPHAGE-XR Take 2 tablets (1,000 mg total) by mouth 2 (two) times daily.   nitroGLYCERIN 0.4 MG SL tablet Commonly known as:  NITROSTAT Place 0.4 mg under the tongue every 5 (five) minutes as needed for chest pain.   ondansetron 4 MG disintegrating tablet Commonly known as:  ZOFRAN ODT Take 1 tablet (4 mg total) by mouth every 8 (eight) hours as needed for nausea or vomiting.   ondansetron 4 MG tablet Commonly known as:  ZOFRAN Take 1 tablet (4 mg total) every 8 (eight) hours as  needed by mouth for nausea or vomiting.   oxyCODONE 15 MG immediate release tablet Commonly known as:  ROXICODONE Take 15 mg 3 (three) times daily as needed by mouth for pain.   pantoprazole 40 MG tablet Commonly known as:  PROTONIX Take 1 tablet (40 mg total) by mouth 2 (two) times daily.   potassium chloride SA 20 MEQ tablet Commonly known as:  K-DUR,KLOR-CON Take 20 mEq by mouth daily.   sertraline 50 MG tablet Commonly known as:  ZOLOFT Take 50 mg by mouth daily.  sitaGLIPtin 100 MG tablet Commonly known as:  JANUVIA Take 100 mg by mouth daily.   tiZANidine 2 MG tablet Commonly known as:  ZANAFLEX Take 2 mg by mouth every 6 (six) hours as needed for muscle spasms.   traZODone 50 MG tablet Commonly known as:  DESYREL Take 1 tablet (50 mg total) by mouth at bedtime.   Vitamin D (Ergocalciferol) 50000 units Caps capsule Commonly known as:  DRISDOL Take 1 capsule by mouth every Monday.      Allergies  Allergen Reactions  . Coconut Flavor [Flavoring Agent] Hives  . Coconut Oil Hives  . Ibuprofen Other (See Comments)    Made gastric ulcers worse  . Aleve [Naproxen]     DUE TO KIDNEYS      The results of significant diagnostics from this hospitalization (including imaging, microbiology, ancillary and laboratory) are listed below for reference.    Significant Diagnostic Studies: Dg Chest 2 View  Result Date: 12/07/2017 CLINICAL DATA:  Ten days of left-sided chest pain which is been relieved with nitroglycerin. History of coronary artery disease with stent placement, diabetes, previous CVA, nonsmoker. EXAM: CHEST  2 VIEW COMPARISON:  Chest x-ray of September 04, 2017 FINDINGS: The lungs are adequately inflated. There is no focal infiltrate. There is no pleural effusion. The heart is top-normal in size. The pulmonary vascularity is normal. The mediastinum is normal in width. There is multilevel degenerative disc disease of the thoracic spine. IMPRESSION: There is no  pneumonia nor other acute cardiopulmonary disease. Top-normal cardiac size. Electronically Signed   By: David  Martinique M.D.   On: 12/07/2017 11:33   Vas [place] Lower Extremity Venous (dvt) (only [facility])  Result Date: 12/07/2017  Lower Venous Study Indication: Edema. Risk Factors: Obesity. 323 lbs Examination Guidelines: A complete evaluation includes B-mode imaging, spectral doppler, color doppler, and power doppler as needed of all accessible portions of each vessel. Bilateral testing is considered an integral part of a complete examination. Limited examinations for reoccurring indications may be performed as noted.  Right Venous Findings: +---+---------------+---------+-----------+----------+-------+    CompressibilityPhasicitySpontaneityPropertiesSummary +---+---------------+---------+-----------+----------+-------+ CFVFull           Yes      Yes                          +---+---------------+---------+-----------+----------+-------+  Left Venous Findings: +---------+---------------+---------+-----------+----------+-------------------+          CompressibilityPhasicitySpontaneityPropertiesSummary             +---------+---------------+---------+-----------+----------+-------------------+ CFV      Full           Yes      Yes                                      +---------+---------------+---------+-----------+----------+-------------------+ FV Prox  Full           Yes      Yes                                      +---------+---------------+---------+-----------+----------+-------------------+ FV Mid   Full           Yes      Yes                                      +---------+---------------+---------+-----------+----------+-------------------+  FV DistalFull           Yes      Yes                                      +---------+---------------+---------+-----------+----------+-------------------+ PFV      Full           Yes      Yes                                       +---------+---------------+---------+-----------+----------+-------------------+ POP      Full           Yes      Yes                                      +---------+---------------+---------+-----------+----------+-------------------+ PTV      Full                                                             +---------+---------------+---------+-----------+----------+-------------------+ PERO                                                  Limited                                                                   visualization due                                                         to body habitus     +---------+---------------+---------+-----------+----------+-------------------+ Gastroc  Full                                                             +---------+---------------+---------+-----------+----------+-------------------+    Final Interpretation: Right: There is no evidence of a common femoral vein obstruction. Left: Limited visualization due o body habitus. There is no obvious evidence of deep vein thrombosis of the lower extremity. There is no obvious evidence of a superficial thrombosis in the lower extremity. There is no obvious evidence of a Baker's cyst.  *See table(s) above for measurements and observations. Electronically signed by Servando Snare on 12/07/2017 at 3:20:29 PM.  Final    Microbiology: No results found for this or any previous visit (from the past 240 hour(s)).   Labs: Basic Metabolic Panel: Recent  Labs  Lab 12/07/17 1111 12/08/17 0359 12/09/17 0420  NA 139 143 141  K 2.8* 2.9* 3.5  CL 100* 101 99*  CO2 25 27 28   GLUCOSE 220* 209* 213*  BUN 10 14 9   CREATININE 0.72 0.66 0.63  CALCIUM 8.5* 8.8* 9.0   Liver Function Tests: No results for input(s): AST, ALT, ALKPHOS, BILITOT, PROT, ALBUMIN in the last 168 hours. No results for input(s): LIPASE, AMYLASE in the last 168 hours. No results for input(s): AMMONIA in  the last 168 hours. CBC: Recent Labs  Lab 12/07/17 1111  WBC 8.1  HGB 11.8*  HCT 36.5*  MCV 89.5  PLT 230   Cardiac Enzymes: Recent Labs  Lab 12/07/17 1727 12/07/17 1956 12/08/17 0212  TROPONINI <0.03 <0.03 <0.03   BNP: BNP (last 3 results) Recent Labs    05/05/17 2051 06/29/17 2218 12/07/17 1111  BNP 11.5 19.4 23.6    ProBNP (last 3 results) No results for input(s): PROBNP in the last 8760 hours.  CBG: Recent Labs  Lab 12/08/17 0903 12/08/17 1128 12/09/17 0809  GLUCAP 185* 169* 206*     Signed:  Velvet Bathe MD.  [facility] 12/09/2017, 11:34 AM

## 2017-12-09 NOTE — Progress Notes (Signed)
Pt discharged from the unit via wheelchair. Discharge instructions reviewed with patient. No questions or concerns at this time.  Hye Trawick W Abdulrahman Bracey, RN

## 2017-12-09 NOTE — Progress Notes (Signed)
Progress Note  Patient Name: [name] Date of Encounter: 12/09/2017  Primary Cardiologist: No primary care provider on file.   Subjective   Feeling better.  No SOB or CP  Inpatient Medications    Scheduled Meds: . aspirin  81 mg Oral Daily  . atorvastatin  80 mg Oral Daily  . carvedilol  25 mg Oral BID WC  . clopidogrel  75 mg Oral Daily  . DULoxetine  60 mg Oral Daily  . famotidine  40 mg Oral QHS  . furosemide  80 mg Oral Daily   And  . furosemide  40 mg Oral QPM  . hydrALAZINE  50 mg Oral TID  . insulin detemir  20 Units Subcutaneous Q2200  . isosorbide dinitrate  30 mg Oral q morning - 10a  . potassium chloride SA  40 mEq Oral BID  . sodium chloride flush  3 mL Intravenous Q12H   Continuous Infusions: . sodium chloride     PRN Meds: sodium chloride, acetaminophen, gi cocktail, hydrALAZINE, morphine injection, ondansetron (ZOFRAN) IV, oxyCODONE, sodium chloride flush   Vital Signs    Vitals:   12/08/17 1555 12/08/17 1600 12/08/17 2022 12/09/17 0447  BP: (!) 169/88 (!) 164/94 140/90 (!) 170/90  Pulse: (!) 58 60 68 66  Resp:   16 16  Temp: 97.6 F (36.4 C)  97.8 F (36.6 C) 98.4 F (36.9 C)  TempSrc: Oral  Oral Oral  SpO2: 92%  98% 93%  Weight:      Height:        Intake/Output Summary (Last 24 hours) at 12/09/2017 0754 Last data filed at 12/09/2017 0600 Gross per 24 hour  Intake 0 ml  Output -  Net 0 ml   Filed Weights   12/07/17 2001  Weight: (!) 319 lb 9.6 oz (145 kg)    Telemetry    NSR - Personally Reviewed  ECG    No new EKG to review - Personally Reviewed  Physical Exam   GEN: No acute distress.   Neck: No JVD Cardiac: RRR, no murmurs, rubs, or gallops.  Respiratory: Clear to auscultation bilaterally. GI: Soft, nontender, non-distended  MS: No edema; No deformity. Neuro:  Nonfocal  Psych: Normal affect   Labs    Chemistry Recent Labs  Lab 12/07/17 1111 12/08/17 0359 12/09/17 0420  NA 139 143 141  K 2.8* 2.9*  3.5  CL 100* 101 99*  CO2 25 27 28   GLUCOSE 220* 209* 213*  BUN 10 14 9   CREATININE 0.72 0.66 0.63  CALCIUM 8.5* 8.8* 9.0  GFRNONAA >60 >60 >60  GFRAA >60 >60 >60  ANIONGAP 14 15 14      Hematology Recent Labs  Lab 12/07/17 1111  WBC 8.1  RBC 4.08*  HGB 11.8*  HCT 36.5*  MCV 89.5  MCH 28.9  MCHC 32.3  RDW 14.0  PLT 230    Cardiac Enzymes Recent Labs  Lab 12/07/17 1727 12/07/17 1956 12/08/17 0212  TROPONINI <0.03 <0.03 <0.03    Recent Labs  Lab 12/07/17 1233  TROPIPOC 0.00     BNP Recent Labs  Lab 12/07/17 1111  BNP 23.6     DDimer  Recent Labs  Lab 12/07/17 1111  DDIMER 0.47     Radiology    Dg Chest 2 View  Result Date: 12/07/2017 CLINICAL DATA:  Ten days of left-sided chest pain which is been relieved with nitroglycerin. History of coronary artery disease with stent placement, diabetes, previous CVA, nonsmoker. EXAM: CHEST  2 VIEW COMPARISON:  Chest x-ray of September 04, 2017 FINDINGS: The lungs are adequately inflated. There is no focal infiltrate. There is no pleural effusion. The heart is top-normal in size. The pulmonary vascularity is normal. The mediastinum is normal in width. There is multilevel degenerative disc disease of the thoracic spine. IMPRESSION: There is no pneumonia nor other acute cardiopulmonary disease. Top-normal cardiac size. Electronically Signed   By: David  Martinique M.D.   On: 12/07/2017 11:33   Vas [place] Lower Extremity Venous (dvt) (only [facility])  Result Date: 12/07/2017  Lower Venous Study Indication: Edema. Risk Factors: Obesity. 323 lbs Examination Guidelines: A complete evaluation includes B-mode imaging, spectral doppler, color doppler, and power doppler as needed of all accessible portions of each vessel. Bilateral testing is considered an integral part of a complete examination. Limited examinations for reoccurring indications may be performed as noted.  Right Venous Findings:  +---+---------------+---------+-----------+----------+-------+    CompressibilityPhasicitySpontaneityPropertiesSummary +---+---------------+---------+-----------+----------+-------+ CFVFull           Yes      Yes                          +---+---------------+---------+-----------+----------+-------+  Left Venous Findings: +---------+---------------+---------+-----------+----------+-------------------+          CompressibilityPhasicitySpontaneityPropertiesSummary             +---------+---------------+---------+-----------+----------+-------------------+ CFV      Full           Yes      Yes                                      +---------+---------------+---------+-----------+----------+-------------------+ FV Prox  Full           Yes      Yes                                      +---------+---------------+---------+-----------+----------+-------------------+ FV Mid   Full           Yes      Yes                                      +---------+---------------+---------+-----------+----------+-------------------+ FV DistalFull           Yes      Yes                                      +---------+---------------+---------+-----------+----------+-------------------+ PFV      Full           Yes      Yes                                      +---------+---------------+---------+-----------+----------+-------------------+ POP      Full           Yes      Yes                                      +---------+---------------+---------+-----------+----------+-------------------+ PTV  Full                                                             +---------+---------------+---------+-----------+----------+-------------------+ PERO                                                  Limited                                                                   visualization due                                                         to body  habitus     +---------+---------------+---------+-----------+----------+-------------------+ Gastroc  Full                                                             +---------+---------------+---------+-----------+----------+-------------------+    Final Interpretation: Right: There is no evidence of a common femoral vein obstruction. Left: Limited visualization due o body habitus. There is no obvious evidence of deep vein thrombosis of the lower extremity. There is no obvious evidence of a superficial thrombosis in the lower extremity. There is no obvious evidence of a Baker's cyst.  *See table(s) above for measurements and observations. Electronically signed by Servando Snare on 12/07/2017 at 3:20:29 PM.     Cardiac Studies   2D Echo 06/2017 Study Conclusions - Left ventricle: The cavity size was normal. Wall thickness was increased in a pattern of mild LVH. Systolic function was normal. The estimated ejection fraction was in the range of 50% to 55%. Wall motion was normal; there were no regional wall motion abnormalities. Features are consistent with a pseudonormal left ventricular filling pattern, with concomitant abnormal relaxation and increased filling pressure (grade 2 diastolic dysfunction). - Aortic valve: Valve area (VTI): 2.5 cm^2. Valve area (Vmax): 2.33 cm^2. Valve area (Vmean): 2.28 cm^2.  Cardiac Cath 12/08/2016 Conclusion     Previously placed Prox LAD to Mid LAD stent (Synergy DES 4.0 mm x 16 mm) is widely patent.  Prox RCA lesion is 20% stenosed.  The left ventricular systolic function is normal. The left ventricular ejection fraction is 55-65% by visual estimate.  LV end diastolic pressure is severely elevated. 27 mmHg  The left ventricular ejection fraction is 55-65% by visual estimate.   Angiographically normal coronary arteries with widely patent LAD stent.  No significant lesions noted. Technically right dominant, but anatomically  codominant.  Essential hypertension with elevated LVEDP.  This could potentially be the explanation for his symptoms.  Recommend continued aggressive  blood pressure management and diuresis.      Patient Profile     57 y.o. male with PUD2005, CAD (s/p DES to LAD 05/2016), frequent ED visits, morbid obesity, cervical radiculopathy, HTN, HLD, DM2,peripheral neuropathy/chronic pain, OSA, DVT?2016-2017 in [place] (on Xarelto for several months per pt report), reported hx of stroke (not seen on imaging during adm for TIA symptoms 05/2017 felt more likely due to cervical radiculopathy),chronic dCHF,multifactorial/B12 deficient anemia,depression, 4.3cmthoracic aorta by CT 9/2018who is being seen today for the evaluation ofchest painat the request of Dr. Leonette Monarch.  Assessment & Plan    1.  Chest pain - this has resolved.   - cath showed patent prox to mid LAD stent, 20% prox RCA, normal LVF and elevated LVEDP at 44mmHg.   - it was felt patient's CP secondary to elevated BP and LVEDP and Lasix increased.   - continue ASA, Plavix, long acting nitrate, statin and BB.    2.  Acute on chronic diastolic CHF  - I&O's and daily weights are incomplete - creatinine stable at 0.63.   - continue Lasix 80mg  qam and 40mg  qpm  3.  HTN - BP remains elevated - continue Hydralazine 50mg  TID, Imdur 30mg  daily, carvedilol 25mg  BID - add Lisinopril 10mg  daily  4.  Hypokalemia - K+ on low end of normal - continue current dose of potassium repletion - we are adding an ACE I so this may help with keeping K+ in normal range - repeat BMET in am  For questions or updates, please contact [place] Please consult [email or web address] for contact info under Cardiology/STEMI.      Signed, Fransico Him, MD  12/09/2017, 7:54 AM

## 2017-12-11 ENCOUNTER — Telehealth: Payer: Self-pay | Admitting: Cardiology

## 2017-12-11 ENCOUNTER — Encounter (HOSPITAL_COMMUNITY): Payer: Self-pay | Admitting: Cardiology

## 2017-12-11 MED FILL — Heparin Sodium (Porcine) 2 Unit/ML in Sodium Chloride 0.9%: INTRAMUSCULAR | Qty: 1000 | Status: AC

## 2017-12-11 NOTE — Telephone Encounter (Signed)
New Message   Patient is calling stating that he was advised from his physical therapist that he needs a letter of release in order to resume therapy. He states that it can be faxed to [facility] at [phone]. Thank you.

## 2017-12-11 NOTE — Telephone Encounter (Signed)
Spoke with pt, aware okay for PT. This note will be faxed to the number provided.

## 2017-12-11 NOTE — Telephone Encounter (Signed)
Ok to resume pt

## 2017-12-11 NOTE — Telephone Encounter (Signed)
Spoke with pt, he is not having any problems since discharge.n his PT is for balance and gait instibility. Will forward for dr Stanford Breed review and ok to resume.

## 2017-12-14 ENCOUNTER — Ambulatory Visit: Payer: Medicaid Other

## 2017-12-14 DIAGNOSIS — M545 Low back pain: Secondary | ICD-10-CM

## 2017-12-14 DIAGNOSIS — R2689 Other abnormalities of gait and mobility: Secondary | ICD-10-CM | POA: Diagnosis not present

## 2017-12-14 DIAGNOSIS — G8929 Other chronic pain: Secondary | ICD-10-CM

## 2017-12-14 DIAGNOSIS — R2681 Unsteadiness on feet: Secondary | ICD-10-CM

## 2017-12-14 DIAGNOSIS — R293 Abnormal posture: Secondary | ICD-10-CM

## 2017-12-14 DIAGNOSIS — M6281 Muscle weakness (generalized): Secondary | ICD-10-CM

## 2017-12-14 LAB — ALDOSTERONE + RENIN ACTIVITY W/ RATIO
ALDO / PRA Ratio: 19.8 (ref 0.0–30.0)
Aldosterone: 5.4 ng/dL (ref 0.0–30.0)
PRA LC/MS/MS: 0.273 ng/mL/hr (ref 0.167–5.380)

## 2017-12-14 NOTE — Patient Instructions (Signed)
Chair Sitting    Sit at edge of seat, spine straight, one leg extended. Put a hand on each thigh and bend forward from the hip, keeping spine straight. Allow hand on extended leg to reach toward toes. Support upper body with other arm. Hold _30__ seconds. Repeat _2-3_ times each leg per session. Do 1-2 sessions per day.  Copyright  VHI. All rights reserved.   Functional Quadriceps: Sit to Stand    Sit on edge of chair, feet flat on floor. Stand upright, extending knees fully. Repeat _10_ times per set. Do _1_ sets per session. Do _1-2_ sessions per day.  http://orth.exer.us/734   Copyright  VHI. All rights reserved.   Holding onto something sturdy for balance assistance:  Ankle Plantar Flexion / Dorsiflexion, Standing    Stand while holding a stable object. Rise up on toes. Then rock back on heels. Hold each position _3-5__ seconds. Repeat _10_ times per session. Do _1-2_ sessions per day.  Copyright  VHI. All rights reserved.   Hip Side Kick    Holding sturdy surface for balance, keep legs shoulder width apart and toes pointed forward. Make sure that your kicking leg is half a step back. Bring one leg out to side, keeping knee straight.Do not lean . Repeat using other leg, alternating legs. Repeat _10_ times. Do _1-2_ sessions per day. Perform 3 days a week.   http://gt2.exer.us/342   Copyright  VHI. All rights reserved.   http://gt2.exer.us/483     HIP EXTENSION - STANDING  While standing, balance on one leg and move your other leg in a backward direction. Do not swing the leg. Perform smooth and controlled movements.   Keep your trunk stable and without arching during the movement.   Use your arms for support if needed for balance and safety.   10 reps each leg, 1-2 times a day. Perform 3 days a week.   Perform this for balance in a corner with a chair in front of you for safety:       Feet Together, Varied Arm Positions - Eyes  Closed    Stand with feet together and arms at sides. Close eyes and visualize upright position. Hold __30__ seconds. Repeat __2-3__ times per session. Do _1-2_ sessions per day.  Copyright  VHI. All rights reserved.

## 2017-12-14 NOTE — Therapy (Signed)
[facility] [address] [place], [place], [address] Phone: [phone]   Fax:  [phone]  Physical Therapy Treatment  Patient Details  Name: [name] MRN: [id number] Date of Birth: 21-Dec-1960 Referring Provider: Marcial Pacas   Encounter Date: 12/14/2017  PT End of Session - 12/14/17 1627    Visit Number  4    Number of Visits  12    Date for PT Re-Evaluation  02/09/18    Authorization Type  Medicaid    Authorization - Visit Number  3    Authorization - Number of Visits  3    PT Start Time  7948    PT Stop Time  1620    PT Time Calculation (min)  46 min    Equipment Utilized During Treatment  -- min guard to S prn    Activity Tolerance  Patient tolerated treatment well    Behavior During Therapy  [facility] for tasks assessed/performed       Past Medical History:  Diagnosis Date  . Arthritis   . Back pain   . CAD (coronary artery disease)    a. s/p DES to LAD in 05/2016  . Cervical radiculopathy   . Chronic diastolic CHF (congestive heart failure) ([place])   . Chronic pain   . Depression   . DVT (deep venous thrombosis) ([place])   . Hematemesis   . Hepatic steatosis   . Hyperlipidemia   . Hypertension   . IBS (irritable bowel syndrome)   . Morbid obesity ([place])   . OSA (obstructive sleep apnea)   . Pancreatitis   . PE (pulmonary thromboembolism) ([place])   . Peripheral neuropathy   . PUD (peptic ulcer disease)   . Renal disorder   . Stroke [facility])    a. ?details unclear - not seen on imaging when he was admitted in 05/2017 for TIA symptoms which were felt due to cervical radiculopathy.  . Thoracic aortic ectasia ([facility])    a. 4.3cm ectatic ascending thoracic aorta by CT 06/2017.   . Type 2 diabetes mellitus ([place])     Past Surgical History:  Procedure Laterality Date  . CARDIAC CATHETERIZATION N/A 05/31/2016   Procedure: Left Heart Cath and Coronary Angiography;  Surgeon: Peter M Martinique, MD;  Location: [facility];  Service: Cardiovascular;  Laterality: N/A;  . CARDIAC CATHETERIZATION N/A 05/31/2016   Procedure: Intravascular Pressure Wire/FFR Study;  Surgeon: Peter M Martinique, MD;  Location: [place] [facility];  Service: Cardiovascular;  Laterality: N/A;  . CARDIAC CATHETERIZATION N/A 05/31/2016   Procedure: Coronary Stent Intervention;  Surgeon: Peter M Martinique, MD;  Location: [place] [facility];  Service: Cardiovascular;  Laterality: N/A;  . LEFT HEART CATH AND CORONARY ANGIOGRAPHY N/A 12/08/2017   Procedure: LEFT HEART CATH AND CORONARY ANGIOGRAPHY;  Surgeon: Leonie Man, MD;  Location: [facility];  Service: Cardiovascular;  Laterality: N/A;  . LEFT HEART CATHETERIZATION WITH CORONARY ANGIOGRAM N/A 02/03/2014   Procedure: LEFT HEART CATHETERIZATION WITH CORONARY ANGIOGRAM;  Surgeon: Pixie Casino, MD;  Location: [facility] CATH LAB;  Service: Cardiovascular;  Laterality: N/A;  . left leg stent       There were no vitals filed for this visit.  Subjective Assessment - 12/14/17 1539    Subjective  Pt reported he was hospitalized for chest pain and reported he has another stent, MD send resume therapy order. Pt denied chest pain today. Pt denied falls.     Pertinent History  PMH includes CVA (last one December 2018-per patient report), arthritis R hip, DVT LLE, spinal stenosis C4-5, C5-6 with disc protrusion/no cord signal change; multi-level degenerative changes and spinal stenosis    Patient Stated Goals  Pt's goals for PT are to walk normal, without pain.    Currently in Pain?  Yes    Pain Score  -- 4-5/10    Pain Location  Back    Pain Orientation  Lower;Left;Right    Pain Descriptors / Indicators  Aching;Sharp;Cramping    Pain Type  Chronic pain    Pain Onset  More than a month ago    Pain Frequency  Constant    Aggravating Factors   certain movements    Pain Relieving Factors  pain medication, injections every 3 months        Therex: Pt performed the following HEP seated  and standing with minimal cues for technique. Pt required intermittent short, seated rest breaks 2/2 pain and fatigue. Please see pt instructions for HEP details.    Chair Sitting    Sit at edge of seat, spine straight, one leg extended. Put a hand on each thigh and bend forward from the hip, keeping spine straight. Allow hand on extended leg to reach toward toes. Support upper body with other arm. Hold _30__ seconds. Repeat _2-3_ times each leg per session. Do 1-2 sessions per day.  Copyright  VHI. All rights reserved.   Functional Quadriceps: Sit to Stand    Sit on edge of chair, feet flat on floor. Stand upright, extending knees fully. Repeat _10_ times per set. Do _1_ sets per session. Do _1-2_ sessions per day.  http://orth.exer.us/734   Copyright  VHI. All rights reserved.   Holding onto something sturdy for balance assistance:  Ankle Plantar Flexion / Dorsiflexion, Standing    Stand while holding a stable object. Rise up on toes. Then rock back on heels. Hold each position _3-5__ seconds. Repeat _10_ times per session. Do _1-2_ sessions per day.  Copyright  VHI. All rights reserved.   Hip Side Kick    Holding sturdy surface for balance, keep legs shoulder width apart and toes pointed forward. Bring one leg out to side, keeping knee straight.Do not lean . Repeat using other leg, alternating legs. Repeat _10_ times. Do _1-2_ sessions per day.  http://gt2.exer.us/342   Copyright  VHI. All rights reserved.   http://gt2.exer.us/483     HIP EXTENSION - STANDING  While standing, balance on one leg and move your other leg in a backward direction. Do not swing the leg. Perform smooth and controlled movements.   Keep your trunk stable and without arching during the movement.   Use your arms for support if needed for balance and safety.   10 reps each leg, 1-2 times a day.  Perform this for balance in a corner with a chair in front of you for  safety:       Feet Together, Varied Arm Positions - Eyes Closed    Stand with feet together and arms at sides. Close eyes and visualize upright position. Hold __30__ seconds. Repeat __2-3__ times per session. Do _1-2_ sessions per day.  Copyright  VHI. All rights reserved.             [facility] Adult PT Treatment/Exercise - 12/14/17 1543      Ambulation/Gait   Ambulation/Gait  Yes    Ambulation/Gait Assistance  5: Supervision    Ambulation/Gait Assistance Details  Pt noted to experience antalgic gait.  Ambulation Distance (Feet)  100 Feet    Assistive device  None    Gait Pattern  Step-through pattern;Lateral trunk lean to left;Antalgic;Trendelenburg;Wide base of support    Ambulation Surface  Level;Indoor    Gait velocity  3.36f/sec and 3.497fsec.       Standardized Balance Assessment   Standardized Balance Assessment  Timed Up and Go Test      Timed Up and Go Test   TUG  Normal TUG    Normal TUG (seconds)  12.05 no AD               PT Short Term Goals - 12/14/17 1630      PT SHORT TERM GOAL #1   Title  Pt will be independent with HEP to address back pain, balance, and gait.  TARGET 12/15/17    Baseline  No current HEP upon eval; pt required cues for technique on 12/14/17 while assessing STGs.     Time  3    Period  Weeks    Status  Partially Met      PT SHORT TERM GOAL #2   Title  Pt will improve improve TUG score to less than or equal to 15 seconds for decreased fall risk.    Baseline  TUG 19.03 seconds (Scores >13.5 seconds indicate increased fall risk.) during eval and 12.05 sec. on 12/14/17    Time  3    Period  Weeks    Status  Achieved      PT SHORT TERM GOAL #3   Title  Pt will verbalize understanding of posture/positioning with ADLs for improved functional mobility and decreased pain.    Baseline  rates pain as 6-8/10 in shoulders, hip, back at eval; sits in forward head, rounded shoulders, posterior pelvic tilt posture, pt rated  pain 4-5/10 in back/LEs and was able to verbalize ways to decr. pain and improve posture on 12/14/17    Time  3    Period  Weeks    Status  Achieved      PT SHORT TERM GOAL #4   Title  Pt will improve gait velocity score to at least 2.62 ft/sec for improved efficiency and safety with gait.    Baseline  Gait velocity 2.53 ft/sec at eval; scores 1.31-2.62 ft/sec indicate limited community ambulator, 3.0036fec. and 3.42f23fc. no AD on 12/14/17    Time  3    Period  Weeks    Status  Achieved        PT Long Term Goals - 12/14/17 1631      PT LONG TERM GOAL #1   Title  Pt will verbalize understanding of fall prevention in home environment.  TARGET 02/09/18    Baseline  at fall risk per DGI and TUG scores    Time  12    Period  Weeks    Status  New      PT LONG TERM GOAL #2   Title  Pt will improve DGI score to at least 19/24 for decreased fall risk.    Baseline  DGI 16/24 (scores <19/24 indicate increased fall risk)    Time  12    Period  Weeks    Status  New      PT LONG TERM GOAL #3   Title  Pt will rate at least 2 point decrease in pain scale for shoulder, hip, back pain, with functional mobility activities.    Baseline  rates pain 6-8/10 at eval    Time  12    Period  Weeks    Status  New      PT LONG TERM GOAL #4   Title  Pt will improve TUGs core to less than or equal to 13.5 seconds for decreased fall risk.    Baseline  TUG score 19.03 sec at eval, 12.05 sec. on 12/14/17    Time  12    Period  Weeks    Status  Achieved      PT LONG TERM GOAL #5   Title  Pt will verbalize plans for ongoing community fitness upon D/C from PT.    Baseline  no current HEP or community fitness routine    Time  12    Period  Weeks    Status  New            Plan - 12/14/17 1627    Clinical Impression Statement  Pt demonstrated progress, as he met STGs 2, 3, and 4. Pt partially met STG 1, as he required minimal cues during HEP. Pt also met LTG 4. Pt continues to require short rest  breaks 2/2 pain and fatigue but is very willing to participate in PT. Pt would continue to benefit from skilled PT to improve safety during functional mobility and decr. pain. PT requesting additional 2x/week for 4 weeks.    Rehab Potential  Good    PT Frequency  Other (comment) 1x/wk for 3 weeks, then 2x/wk for 4 weeks    PT Duration  Other (comment) over 12 week period    PT Treatment/Interventions  ADLs/Self Care Home Management;Electrical Stimulation;Traction;Gait training;Functional mobility training;Ultrasound;Therapeutic activities;Therapeutic exercise;Balance training;DME Instruction;Neuromuscular re-education;Stair training;Patient/family education    PT Next Visit Plan  continue to work on postural stretching, LE stretching, balance and exercises. add more visits as they are approved vs transition to [facility] (they have already been in contact with pt)    Consulted and Agree with Plan of Care  Patient       Patient will benefit from skilled therapeutic intervention in order to improve the following deficits and impairments:  Abnormal gait, Decreased activity tolerance, Decreased balance, Decreased mobility, Decreased knowledge of use of DME, Decreased strength, Difficulty walking, Postural dysfunction, Pain, Decreased knowledge of precautions  Visit Diagnosis: Other abnormalities of gait and mobility  Unsteadiness on feet  Abnormal posture  Muscle weakness (generalized)  Chronic right-sided low back pain without sciatica     Problem List Patient Active Problem List   Diagnosis Date Noted  . B12 deficiency 08/10/2017  . Persistent headaches 08/09/2017  . GERD (gastroesophageal reflux disease) 06/29/2017  . Left arm numbness   . Cerebral embolism with cerebral infarction 06/17/2017  . TIA (transient ischemic attack) 06/17/2017  . Chronic back pain 12/29/2016  . Depression 12/15/2016  . Vitamin D deficiency 12/09/2016  . Right hip pain  12/07/2016  . Hypokalemia 12/07/2016  . Type 2 diabetes mellitus with vascular disease ([place]) 05/31/2016  . Normocytic normochromic anemia 05/31/2016  . Chest pain 05/31/2016  . CAD (coronary artery disease) 01/06/2016  . DVT (deep venous thrombosis) ([place]) 01/06/2016  . Lactic acidosis 05/28/2014  . Nonspecific chest pain 01/29/2014  . Uncontrolled secondary diabetes with peripheral neuropathy ([place]) 01/29/2014  . Obesity, Class III, BMI 40-49.9 (morbid obesity) ([place]) 01/29/2014  . Snoring 01/29/2014  . Dyslipidemia 01/29/2014  . HTN (hypertension) 01/29/2014  . Abnormal nuclear stress test 01/29/2014    Darryl Willner L 12/14/2017, 4:34 PM  [facility]  [address] [place], [place], [address] Phone: [phone]   Fax:  [phone]  Name: BORNA WESSINGER MRN: [id number] Date of Birth: 1961-02-21  Geoffry Paradise, PT,DPT 12/14/17 4:35 PM Phone: [phone] Fax: [phone]

## 2017-12-21 ENCOUNTER — Ambulatory Visit: Payer: Medicaid Other | Admitting: *Deleted

## 2017-12-21 DIAGNOSIS — E538 Deficiency of other specified B group vitamins: Secondary | ICD-10-CM

## 2017-12-21 MED ORDER — CYANOCOBALAMIN 1000 MCG/ML IJ SOLN
1000.0000 ug | Freq: Once | INTRAMUSCULAR | Status: AC
  Administered 2017-12-21: 1000 ug via INTRAMUSCULAR

## 2017-12-26 ENCOUNTER — Encounter: Payer: Self-pay | Admitting: Physical Therapy

## 2017-12-26 ENCOUNTER — Ambulatory Visit: Payer: Medicaid Other | Attending: Neurology | Admitting: Physical Therapy

## 2017-12-26 DIAGNOSIS — M545 Low back pain: Secondary | ICD-10-CM | POA: Insufficient documentation

## 2017-12-26 DIAGNOSIS — R293 Abnormal posture: Secondary | ICD-10-CM

## 2017-12-26 DIAGNOSIS — R2681 Unsteadiness on feet: Secondary | ICD-10-CM | POA: Diagnosis present

## 2017-12-26 DIAGNOSIS — M6281 Muscle weakness (generalized): Secondary | ICD-10-CM | POA: Diagnosis present

## 2017-12-26 DIAGNOSIS — R2689 Other abnormalities of gait and mobility: Secondary | ICD-10-CM | POA: Diagnosis not present

## 2017-12-26 DIAGNOSIS — G8929 Other chronic pain: Secondary | ICD-10-CM | POA: Diagnosis present

## 2017-12-26 NOTE — Therapy (Addendum)
[facility] [address] [place], [place], [address] Phone: [phone]   Fax:  [phone]  Physical Therapy Treatment  Patient Details  Name: [name] MRN: [id number] Date of Birth: [birth year] Referring Provider: Marcial Pacas   Encounter Date: 12/26/2017  PT End of Session - 12/26/17 1516    Visit Number  5    Number of Visits  12    Date for PT Re-Evaluation  02/09/18    Authorization Type  Medicaid: 3 visits initially authorized, recieved 8 additional visits from 12/26/17- 01/22/18    Authorization - Visit Number  5    Authorization - Number of Visits  11    PT Start Time  4076    PT Stop Time  1445    PT Time Calculation (min)  43 min    Equipment Utilized During Treatment  Gait belt    Activity Tolerance  Patient tolerated treatment well;Patient limited by pain    Behavior During Therapy  [facility] for tasks assessed/performed       Past Medical History:  Diagnosis Date  . Arthritis   . Back pain   . CAD (coronary artery disease)    a. s/p DES to LAD in 05/2016  . Cervical radiculopathy   . Chronic diastolic CHF (congestive heart failure) ([place])   . Chronic pain   . Depression   . DVT (deep venous thrombosis) ([place])   . Hematemesis   . Hepatic steatosis   . Hyperlipidemia   . Hypertension   . IBS (irritable bowel syndrome)   . Morbid obesity ([place])   . OSA (obstructive sleep apnea)   . Pancreatitis   . PE (pulmonary thromboembolism) ([place])   . Peripheral neuropathy   . PUD (peptic ulcer disease)   . Renal disorder   . Stroke [facility])    a. ?details unclear - not seen on imaging when he was admitted in 05/2017 for TIA symptoms which were felt due to cervical radiculopathy.  . Thoracic aortic ectasia ([facility])    a. 4.3cm ectatic ascending thoracic aorta by CT 06/2017.   . Type 2 diabetes mellitus ([place])     Past Surgical History:  Procedure Laterality Date  . CARDIAC CATHETERIZATION N/A 05/31/2016    Procedure: Left Heart Cath and Coronary Angiography;  Surgeon: Peter M Martinique, MD;  Location: [facility];  Service: Cardiovascular;  Laterality: N/A;  . CARDIAC CATHETERIZATION N/A 05/31/2016   Procedure: Intravascular Pressure Wire/FFR Study;  Surgeon: Peter M Martinique, MD;  Location: [facility];  Service: Cardiovascular;  Laterality: N/A;  . CARDIAC CATHETERIZATION N/A 05/31/2016   Procedure: Coronary Stent Intervention;  Surgeon: Peter M Martinique, MD;  Location: [facility];  Service: Cardiovascular;  Laterality: N/A;  . LEFT HEART CATH AND CORONARY ANGIOGRAPHY N/A 12/08/2017   Procedure: LEFT HEART CATH AND CORONARY ANGIOGRAPHY;  Surgeon: Leonie Man, MD;  Location: [facility];  Service: Cardiovascular;  Laterality: N/A;  . LEFT HEART CATHETERIZATION WITH CORONARY ANGIOGRAM N/A 02/03/2014   Procedure: LEFT HEART CATHETERIZATION WITH CORONARY ANGIOGRAM;  Surgeon: Pixie Casino, MD;  Location: [facility] CATH LAB;  Service: Cardiovascular;  Laterality: N/A;  . left leg stent       There were no vitals filed for this visit.  Subjective Assessment - 12/26/17 1401    Subjective  Pt reported increased low back and B knee pain. No changes in medication and no falls.     Pertinent History  PMH includes CVA (last one December 2018-per patient report), arthritis R hip, DVT LLE, spinal stenosis C4-5, C5-6 with disc protrusion/no cord signal change; multi-level degenerative changes and spinal stenosis    Patient Stated Goals  Pt's goals for PT are to walk normal, without pain.    Currently in Pain?  Yes    Pain Score  7     Pain Location  Back    Pain Orientation  Lower    Pain Descriptors / Indicators  Aching    Pain Type  Chronic pain    Pain Onset  More than a month ago    Pain Frequency  Constant    Multiple Pain Sites  Yes    Pain Score  7    Pain Location  Knee    Pain Orientation  Right;Left    Pain Descriptors / Indicators  Aching    Pain Onset  More than a month  ago    Pain Frequency  Constant              [facility] Adult PT Treatment/Exercise - 12/26/17 1453      Transfers   Transfers  Sit to Stand;Stand to Sit    Sit to Stand  6: Modified independent (Device/Increase time);With upper extremity assist;From chair/3-in-1    Stand to Sit  6: Modified independent (Device/Increase time);With upper extremity assist;To chair/3-in-1 x 5 reps during pt rest breaks.       Ambulation/Gait   Ambulation/Gait  Yes    Ambulation/Gait Assistance  5: Supervision    Ambulation Distance (Feet)  50 Feet x 3 reps around clinic    Assistive device  None    Gait Pattern  Step-through pattern;Lateral trunk lean to left;Antalgic;Trendelenburg;Wide base of support    Ambulation Surface  Level;Indoor      High Level Balance   High Level Balance Activities  Side stepping;Tandem walking;Marching forwards;Marching backwards;Backward walking;Other (comment) ; Forward walking; Fwd/Bwd tandem    High Level Balance Comments  Performed at counter top with no to single UE support: on red and blue mat x 3 laps each. min guard to min assist for balance.           Balance Exercises - 12/26/17 1455      Balance Exercises: Standing   Standing Eyes Opened  Narrow base of support (BOS);Wide (BOA);Head turns;Foam/compliant surface;30 secs;2 reps standing on Airex; chair in front for occasional UE support    Standing Eyes Closed  Wide (BOA);Foam/compliant surface;Other reps (comment);30 secs;Limitations;Narrow base of support (BOS);Head turns;2 reps Standing on airex pad, with chair for occasional UE support.    SLS with Vectors  --    Rockerboard  Anterior/posterior;Lateral;Head turns;EO;EC;30 seconds;Intermittent UE support;10 reps Standing on small rockerboard, at parallel bars    Balance Beam  standing across black foam beam: alternating fwd heel taps, and forward step x10 reps each with min to mod assist for balance and intermittent./light UE support on chair.        Balance Exercises: Standing   Standing Eyes Closed Limitations  on airex in corner with chair in front for safety: EC no head movements, EC head movements left to right, up and down, min guard to occasional min assist for balance. cues on posture, weight shifting and stance position for balance.     Rebounder Limitations  Standing on rockerboard performed 30 sec x 2 each: A/P weight shifting, Lateral weight shifting, open stance EC with head turns up/down, left/right. Pt required occasional two finger touch to  regain balance during EC and head turn activities. Pt swayed in all directions, but most of the time had a posterior lean when trying to regain his center of gravity.            PT Short Term Goals - 12/14/17 1630      PT SHORT TERM GOAL #1   Title  Pt will be independent with HEP to address back pain, balance, and gait.  TARGET 12/15/17    Baseline  No current HEP upon eval; pt required cues for technique on 12/14/17 while assessing STGs.     Time  3    Period  Weeks    Status  Partially Met      PT SHORT TERM GOAL #2   Title  Pt will improve improve TUG score to less than or equal to 15 seconds for decreased fall risk.    Baseline  TUG 19.03 seconds (Scores >13.5 seconds indicate increased fall risk.) during eval and 12.05 sec. on 12/14/17    Time  3    Period  Weeks    Status  Achieved      PT SHORT TERM GOAL #3   Title  Pt will verbalize understanding of posture/positioning with ADLs for improved functional mobility and decreased pain.    Baseline  rates pain as 6-8/10 in shoulders, hip, back at eval; sits in forward head, rounded shoulders, posterior pelvic tilt posture, pt rated pain 4-5/10 in back/LEs and was able to verbalize ways to decr. pain and improve posture on 12/14/17    Time  3    Period  Weeks    Status  Achieved      PT SHORT TERM GOAL #4   Title  Pt will improve gait velocity score to at least 2.62 ft/sec for improved efficiency and safety with gait.     Baseline  Gait velocity 2.53 ft/sec at eval; scores 1.31-2.62 ft/sec indicate limited community ambulator, 3.74f/sec. and 3.480fsec. no AD on 12/14/17    Time  3    Period  Weeks    Status  Achieved        PT Long Term Goals - 12/14/17 1631      PT LONG TERM GOAL #1   Title  Pt will verbalize understanding of fall prevention in home environment.  TARGET 02/09/18    Baseline  at fall risk per DGI and TUG scores    Time  12    Period  Weeks    Status  New      PT LONG TERM GOAL #2   Title  Pt will improve DGI score to at least 19/24 for decreased fall risk.    Baseline  DGI 16/24 (scores <19/24 indicate increased fall risk)    Time  12    Period  Weeks    Status  New      PT LONG TERM GOAL #3   Title  Pt will rate at least 2 point decrease in pain scale for shoulder, hip, back pain, with functional mobility activities.    Baseline  rates pain 6-8/10 at eval    Time  12    Period  Weeks    Status  New      PT LONG TERM GOAL #4   Title  Pt will improve TUGs core to less than or equal to 13.5 seconds for decreased fall risk.    Baseline  TUG score 19.03 sec at eval, 12.05 sec. on 12/14/17    Time  12    Period  Weeks    Status  Achieved      PT LONG TERM GOAL #5   Title  Pt will verbalize plans for ongoing community fitness upon D/C from PT.    Baseline  no current HEP or community fitness routine    Time  12    Period  Weeks    Status  New            Plan - 12/26/17 1519    Clinical Impression Statement  Today's skilled PT session focused on working on improving static and dynamic balance activities on uneven surfaces. Pt required brief rest breaks due to increased pain and fatigue throughout session. Pt continues to progress with balance activity tolerance. However, is not yet compliant to current HEP due to reported pain. Pt would benefit from continued PT towards unmet LTG's.     History and Personal Factors relevant to plan of care:  PMH: arthritis, back pain,  CAD, DVT, HTN, IBS, OSA, PE, CVA, DM, MRI shows cervical spinal stenosis, disc protrusion with no cord signal damage, multilevel spinal stenosis and degenerative changes    Clinical Presentation  Evolving    Clinical Presentation due to:  2 falls in past 6 months; cervical spine, hip, lumbar spine pain involvement    Clinical Decision Making  Moderate    Rehab Potential  Good    PT Frequency  Other (comment)    PT Duration  Other (comment)    PT Treatment/Interventions  ADLs/Self Care Home Management;Electrical Stimulation;Traction;Gait training;Functional mobility training;Ultrasound;Therapeutic activities;Therapeutic exercise;Balance training;DME Instruction;Neuromuscular re-education;Stair training;Patient/family education    PT Next Visit Plan  Continue to work on high level balance on uneven surfaces. Consider postural stretching, LE stretching, balance and exercises.     Consulted and Agree with Plan of Care  Patient       Patient will benefit from skilled therapeutic intervention in order to improve the following deficits and impairments:  Abnormal gait, Decreased activity tolerance, Decreased balance, Decreased mobility, Decreased knowledge of use of DME, Decreased strength, Difficulty walking, Postural dysfunction, Pain, Decreased knowledge of precautions  Visit Diagnosis: Other abnormalities of gait and mobility  Unsteadiness on feet  Abnormal posture     Problem List Patient Active Problem List   Diagnosis Date Noted  . B12 deficiency 08/10/2017  . Persistent headaches 08/09/2017  . GERD (gastroesophageal reflux disease) 06/29/2017  . Left arm numbness   . Cerebral embolism with cerebral infarction 06/17/2017  . TIA (transient ischemic attack) 06/17/2017  . Chronic back pain 12/29/2016  . Depression 12/15/2016  . Vitamin D deficiency 12/09/2016  . Right hip pain 12/07/2016  . Hypokalemia 12/07/2016  . Type 2 diabetes mellitus with vascular disease ([place]) 05/31/2016   . Normocytic normochromic anemia 05/31/2016  . Chest pain 05/31/2016  . CAD (coronary artery disease) 01/06/2016  . DVT (deep venous thrombosis) ([place]) 01/06/2016  . Lactic acidosis 05/28/2014  . Nonspecific chest pain 01/29/2014  . Uncontrolled secondary diabetes with peripheral neuropathy ([place]) 01/29/2014  . Obesity, Class III, BMI 40-49.9 (morbid obesity) ([place]) 01/29/2014  . Snoring 01/29/2014  . Dyslipidemia 01/29/2014  . HTN (hypertension) 01/29/2014  . Abnormal nuclear stress test 01/29/2014    Carlena Sax 12/26/2017, 3:27 PM  [facility] [address] [place], [place], [address] Phone: [phone]   Fax:  [phone]  Name: [name] MRN: [id number] Date of Birth: 1961-02-02

## 2017-12-27 ENCOUNTER — Encounter (HOSPITAL_COMMUNITY): Payer: Self-pay | Admitting: Emergency Medicine

## 2017-12-27 ENCOUNTER — Emergency Department (HOSPITAL_COMMUNITY)
Admission: EM | Admit: 2017-12-27 | Discharge: 2017-12-27 | Disposition: A | Discharge status: Home / Self Care | Payer: Medicaid Other | Attending: Emergency Medicine | Admitting: Emergency Medicine

## 2017-12-27 ENCOUNTER — Emergency Department (HOSPITAL_COMMUNITY): Payer: Medicaid Other

## 2017-12-27 DIAGNOSIS — Z7902 Long term (current) use of antithrombotics/antiplatelets: Secondary | ICD-10-CM | POA: Diagnosis not present

## 2017-12-27 DIAGNOSIS — Z79899 Other long term (current) drug therapy: Secondary | ICD-10-CM | POA: Insufficient documentation

## 2017-12-27 DIAGNOSIS — Z7982 Long term (current) use of aspirin: Secondary | ICD-10-CM | POA: Diagnosis not present

## 2017-12-27 DIAGNOSIS — E1140 Type 2 diabetes mellitus with diabetic neuropathy, unspecified: Secondary | ICD-10-CM | POA: Diagnosis not present

## 2017-12-27 DIAGNOSIS — I5032 Chronic diastolic (congestive) heart failure: Secondary | ICD-10-CM | POA: Insufficient documentation

## 2017-12-27 DIAGNOSIS — Z794 Long term (current) use of insulin: Secondary | ICD-10-CM | POA: Insufficient documentation

## 2017-12-27 DIAGNOSIS — I259 Chronic ischemic heart disease, unspecified: Secondary | ICD-10-CM | POA: Insufficient documentation

## 2017-12-27 DIAGNOSIS — E1159 Type 2 diabetes mellitus with other circulatory complications: Secondary | ICD-10-CM | POA: Insufficient documentation

## 2017-12-27 DIAGNOSIS — I110 Hypertensive heart disease with heart failure: Secondary | ICD-10-CM | POA: Diagnosis not present

## 2017-12-27 DIAGNOSIS — R079 Chest pain, unspecified: Secondary | ICD-10-CM | POA: Diagnosis not present

## 2017-12-27 DIAGNOSIS — Z955 Presence of coronary angioplasty implant and graft: Secondary | ICD-10-CM | POA: Insufficient documentation

## 2017-12-27 LAB — CBC
HCT: 33.6 % — ABNORMAL LOW (ref 39.0–52.0)
Hemoglobin: 10.9 g/dL — ABNORMAL LOW (ref 13.0–17.0)
MCH: 29.5 pg (ref 26.0–34.0)
MCHC: 32.4 g/dL (ref 30.0–36.0)
MCV: 91.1 fL (ref 78.0–100.0)
Platelets: 240 K/uL (ref 150–400)
RBC: 3.69 MIL/uL — ABNORMAL LOW (ref 4.22–5.81)
RDW: 14.7 % (ref 11.5–15.5)
WBC: 9.6 K/uL (ref 4.0–10.5)

## 2017-12-27 LAB — BASIC METABOLIC PANEL WITH GFR
Anion gap: 17 — ABNORMAL HIGH (ref 5–15)
BUN: 15 mg/dL (ref 6–20)
CO2: 23 mmol/L (ref 22–32)
Calcium: 7.9 mg/dL — ABNORMAL LOW (ref 8.9–10.3)
Chloride: 98 mmol/L — ABNORMAL LOW (ref 101–111)
Creatinine, Ser: 1.38 mg/dL — ABNORMAL HIGH (ref 0.61–1.24)
GFR calc Af Amer: >60 mL/min
GFR calc non Af Amer: 57 mL/min — ABNORMAL LOW
Glucose, Bld: 231 mg/dL — ABNORMAL HIGH (ref 65–99)
Potassium: 2.9 mmol/L — ABNORMAL LOW (ref 3.5–5.1)
Sodium: 138 mmol/L (ref 135–145)

## 2017-12-27 LAB — I-STAT TROPONIN, ED
Troponin i, poc: 0.01 ng/mL (ref 0.00–0.08)
Troponin i, poc: 0 ng/mL (ref 0.00–0.08)

## 2017-12-27 MED ORDER — POTASSIUM CHLORIDE CRYS ER 20 MEQ PO TBCR
40.0000 meq | EXTENDED_RELEASE_TABLET | Freq: Once | ORAL | Status: AC
  Administered 2017-12-27: 40 meq via ORAL
  Filled 2017-12-27: qty 2

## 2017-12-27 MED ORDER — NITROGLYCERIN 0.4 MG SL SUBL: 0.4000 mg | SUBLINGUAL_TABLET | SUBLINGUAL | Status: DC | PRN

## 2017-12-27 NOTE — ED Provider Notes (Signed)
[facility] EMERGENCY DEPARTMENT Provider Note   CSN: [id number] Arrival date & time: 12/27/17  1044     History   Chief Complaint Chief Complaint  Patient presents with  . Chest Pain    HPI [name] is a 57 y.o. male with a past medical history of CAD s/p DES to LAD in 2017, HTN, HLD, T2DM, remote hx provoke DVT, OSA, obesity, TIAs, CHF, anemia who presented to the ED today complaining of chest pain. Patient states that he woke up around 7:30 this morning and upon standing he felt sudden onset dizziness, cold sweats. He had associated left-sided chest pain that was sharp in nature and then eventually changed to a chest pressure which has persisted for several hours. He denies any associated paresthesias, syncope, shortness of breath. Patient states this feels like similar chest pain he's pad in the past. He did not take his nitroglycerin this morning but did take 2-3 baby aspirins. Patient had a similar presentation to the ED on 12/07/2017. He underwent left heart cath which showed patent LAD stent, RCA with 20% stenosis, EF 55-60%, elevated left ventricular end diastolic pressure. Per cardiology notes they reported that his chest pain is most likely related to his essential hypertension and LV EDP. Patient states he been doing well at home without pain until today. He does take his Plavix and reports compliance with this medication.  Of note, he had a vascular doppler study of his lower extremities in February 2019 that was negative for DVT.  HPI  Past Medical History:  Diagnosis Date  . Arthritis   . Back pain   . CAD (coronary artery disease)    a. s/p DES to LAD in 05/2016  . Cervical radiculopathy   . Chronic diastolic CHF (congestive heart failure) ([place])   . Chronic pain   . Depression   . DVT (deep venous thrombosis) ([place])   . Hematemesis   . Hepatic steatosis   . Hyperlipidemia   . Hypertension   . IBS (irritable bowel syndrome)   . Morbid  obesity ([place])   . OSA (obstructive sleep apnea)   . Pancreatitis   . PE (pulmonary thromboembolism) ([place])   . Peripheral neuropathy   . PUD (peptic ulcer disease)   . Renal disorder   . Stroke [facility])    a. ?details unclear - not seen on imaging when he was admitted in 05/2017 for TIA symptoms which were felt due to cervical radiculopathy.  . Thoracic aortic ectasia (HCC)    a. 4.3cm ectatic ascending thoracic aorta by CT 06/2017.   . Type 2 diabetes mellitus [facility])     Patient Active Problem List   Diagnosis Date Noted  . B12 deficiency 08/10/2017  . Persistent headaches 08/09/2017  . GERD (gastroesophageal reflux disease) 06/29/2017  . Left arm numbness   . Cerebral embolism with cerebral infarction 06/17/2017  . TIA (transient ischemic attack) 06/17/2017  . Chronic back pain 12/29/2016  . Depression 12/15/2016  . Vitamin D deficiency 12/09/2016  . Right hip pain 12/07/2016  . Hypokalemia 12/07/2016  . Type 2 diabetes mellitus with vascular disease ([place]) 05/31/2016  . Normocytic normochromic anemia 05/31/2016  . Chest pain 05/31/2016  . CAD (coronary artery disease) 01/06/2016  . DVT (deep venous thrombosis) ([place]) 01/06/2016  . Lactic acidosis 05/28/2014  . Nonspecific chest pain 01/29/2014  . Uncontrolled secondary diabetes with peripheral neuropathy ([place]) 01/29/2014  . Obesity, Class III, BMI 40-49.9 (morbid obesity) ([place]) 01/29/2014  . Snoring  01/29/2014  . Dyslipidemia 01/29/2014  . HTN (hypertension) 01/29/2014  . Abnormal nuclear stress test 01/29/2014    Past Surgical History:  Procedure Laterality Date  . CARDIAC CATHETERIZATION N/A 05/31/2016   Procedure: Left Heart Cath and Coronary Angiography;  Surgeon: Peter M Martinique, MD;  Location: [facility] CV LAB;  Service: Cardiovascular;  Laterality: N/A;  . CARDIAC CATHETERIZATION N/A 05/31/2016   Procedure: Intravascular Pressure Wire/FFR Study;  Surgeon: Peter M Martinique, MD;  Location: [facility] CV LAB;  Service:  Cardiovascular;  Laterality: N/A;  . CARDIAC CATHETERIZATION N/A 05/31/2016   Procedure: Coronary Stent Intervention;  Surgeon: Peter M Martinique, MD;  Location: [facility] CV LAB;  Service: Cardiovascular;  Laterality: N/A;  . LEFT HEART CATH AND CORONARY ANGIOGRAPHY N/A 12/08/2017   Procedure: LEFT HEART CATH AND CORONARY ANGIOGRAPHY;  Surgeon: Leonie Man, MD;  Location: [facility] CV LAB;  Service: Cardiovascular;  Laterality: N/A;  . LEFT HEART CATHETERIZATION WITH CORONARY ANGIOGRAM N/A 02/03/2014   Procedure: LEFT HEART CATHETERIZATION WITH CORONARY ANGIOGRAM;  Surgeon: Pixie Casino, MD;  Location: [facility] CATH LAB;  Service: Cardiovascular;  Laterality: N/A;  . left leg stent          Home Medications    Prior to Admission medications   Medication Sig Start Date End Date Taking? Authorizing Provider  aspirin 81 MG chewable tablet Chew 1 tablet (81 mg total) by mouth daily. 06/01/16   Elwin Mocha, MD  atorvastatin (LIPITOR) 80 MG tablet Take 1 tablet (80 mg total) by mouth daily. 01/26/17   Ahmed Prima, Fransisco Hertz, PA-C  carvedilol (COREG) 25 MG tablet Take 1 tablet (25 mg total) by mouth 2 (two) times daily with a meal. 01/26/17   Strader, [place] M, PA-C  clopidogrel (PLAVIX) 75 MG tablet Take 75 mg by mouth daily.    [provider]  DULoxetine (CYMBALTA) 60 MG capsule Take 1 capsule (60 mg total) by mouth daily. 11/06/17   Marcial Pacas, MD  famotidine (PEPCID) 40 MG tablet Take 1 tablet (40 mg total) by mouth at bedtime. Patient taking differently: Take 40 mg by mouth daily.  06/18/17   Barton Dubois, MD  furosemide (LASIX) 40 MG tablet Take 1 tablet (40 mg total) by mouth every evening. 12/09/17   Velvet Bathe, MD  furosemide (LASIX) 80 MG tablet Take 1 tablet (80 mg total) by mouth daily. 12/10/17   Velvet Bathe, MD  glipiZIDE (GLUCOTROL) 10 MG tablet Take 10 mg by mouth 2 (two) times daily. 05/30/17   [provider]  hydrALAZINE (APRESOLINE) 50 MG tablet Take 1  tablet (50 mg total) by mouth 3 (three) times daily. 07/11/17   Barrett, Evelene Croon, PA-C  Insulin Detemir (LEVEMIR) 100 UNIT/ML Pen Inject 20 Units into the skin daily at 10 pm. Patient taking differently: Inject 50 Units daily at 10 pm into the skin.  06/18/17   Barton Dubois, MD  Insulin Pen Needle 31G X 5 MM MISC Use 1 needle daily to inject insulin as prescribed 06/18/17   Barton Dubois, MD  isosorbide dinitrate (ISORDIL) 30 MG tablet Take 1 tablet (30 mg total) by mouth every morning. 12/10/17   Velvet Bathe, MD  lisinopril (PRINIVIL,ZESTRIL) 10 MG tablet Take 1 tablet (10 mg total) by mouth daily. 12/10/17   Velvet Bathe, MD  LYRICA 100 MG capsule Take 100 mg by mouth 2 (two) times daily. 11/29/17   [provider]  metFORMIN (GLUCOPHAGE-XR) 500 MG 24 hr tablet Take 2 tablets (1,000 mg  total) by mouth 2 (two) times daily. 07/02/17   Barton Dubois, MD  nitroGLYCERIN (NITROSTAT) 0.4 MG SL tablet Place 0.4 mg under the tongue every 5 (five) minutes as needed for chest pain. 12/17/16   [provider]  ondansetron (ZOFRAN ODT) 4 MG disintegrating tablet Take 1 tablet (4 mg total) by mouth every 8 (eight) hours as needed for nausea or vomiting. Patient not taking: Reported on 12/07/2017 06/09/17   Varney Biles, MD  ondansetron (ZOFRAN) 4 MG tablet Take 1 tablet (4 mg total) every 8 (eight) hours as needed by mouth for nausea or vomiting. 09/02/17   Harris, Vernie Shanks, PA-C  oxyCODONE (ROXICODONE) 15 MG immediate release tablet Take 15 mg 3 (three) times daily as needed by mouth for pain.  08/28/17   [provider]  pantoprazole (PROTONIX) 40 MG tablet Take 1 tablet (40 mg total) by mouth 2 (two) times daily. 06/18/17   Barton Dubois, MD  potassium chloride SA (K-DUR,KLOR-CON) 20 MEQ tablet Take 20 mEq by mouth daily.    [provider]  sertraline (ZOLOFT) 50 MG tablet Take 50 mg by mouth daily.    [provider]  sitaGLIPtin (JANUVIA) 100 MG tablet Take 100 mg  by mouth daily.    [provider]  tiZANidine (ZANAFLEX) 2 MG tablet Take 2 mg by mouth every 6 (six) hours as needed for muscle spasms.    [provider]  traZODone (DESYREL) 50 MG tablet Take 1 tablet (50 mg total) by mouth at bedtime. 12/15/16   Florinda Marker, MD  Vitamin D, Ergocalciferol, (DRISDOL) 50000 units CAPS capsule Take 1 capsule by mouth every Monday. 11/06/17   [provider]    Family History Family History  Problem Relation Age of Onset  . Cancer Father   . Hypertension Mother   . Diabetes Mother   . Breast cancer Mother   . Hypertension Brother   . Diabetes Brother   . Hypertension Sister   . Diabetes Sister     Social History Social History   Tobacco Use  . Smoking status: Never Smoker  . Smokeless tobacco: Never Used  Substance Use Topics  . Alcohol use: No  . Drug use: No     Allergies   Coconut flavor [flavoring agent]; Coconut oil; Ibuprofen; and Aleve [naproxen]   Review of Systems Review of Systems  All other systems reviewed and are negative.    Physical Exam Updated Vital Signs BP (!) 100/50   Pulse 69   Resp 14   Ht 5\' 10"  (1.778 m)   Wt (!) 144.7 kg (319 lb)   SpO2 94%   BMI 45.77 kg/m   Physical Exam  Constitutional: He is oriented to person, place, and time. He appears well-developed. No distress.  obese  HENT:  Head: Normocephalic and atraumatic.  Mouth/Throat: No oropharyngeal exudate.  Eyes: Conjunctivae and EOM are normal. Pupils are equal, round, and reactive to light. Right eye exhibits no discharge. Left eye exhibits no discharge. No scleral icterus.  Cardiovascular: Normal rate, regular rhythm, normal heart sounds and intact distal pulses. Exam reveals no gallop and no friction rub.  No murmur heard. Venous stasis in b/l LE  Pulmonary/Chest: Effort normal and breath sounds normal. No respiratory distress. He has no wheezes. He has no rales. He exhibits no tenderness.  Abdominal: Soft. He  exhibits no distension. There is no tenderness. There is no guarding.  Musculoskeletal: Normal range of motion. He exhibits no edema.  Neurological: He is  alert and oriented to person, place, and time.  Skin: Skin is warm and dry. No rash noted. He is not diaphoretic. No erythema. No pallor.  Psychiatric: He has a normal mood and affect. His behavior is normal.  Nursing note and vitals reviewed.    ED Treatments / Results  Labs (all labs ordered are listed, but only abnormal results are displayed) Labs Reviewed  BASIC METABOLIC PANEL - Abnormal; Notable for the following components:      Result Value   Potassium 2.9 (*)    Chloride 98 (*)    Glucose, Bld 231 (*)    Creatinine, Ser 1.38 (*)    Calcium 7.9 (*)    GFR calc non Af Amer 57 (*)    Anion gap 17 (*)    All other components within normal limits  CBC - Abnormal; Notable for the following components:   RBC 3.69 (*)    Hemoglobin 10.9 (*)    HCT 33.6 (*)    All other components within normal limits  I-STAT TROPONIN, ED    EKG  EKG Interpretation None       Radiology No results found.  Procedures Procedures (including critical care time)  Medications Ordered in ED Medications  nitroGLYCERIN (NITROSTAT) SL tablet 0.4 mg (not administered)     Initial Impression / Assessment and Plan / ED Course  I have reviewed the triage vital signs and the nursing notes.  Pertinent labs & imaging results that were available during my care of the patient were reviewed by me and considered in my medical decision making (see chart for details).     57 year old male with history of coronary artery disease presents to the ED today complaining of chest pain onset this morning. On arrival to the ED vitals are stable. He was given several nitroglycerin with relief in his symptoms. Per chart review he had a left heart catheterization 2/15 with a patent LAD stent, 20% RCA stenosis. Per cardiology note his chest pain at that time  was related to essential hypertension in the setting of elevated left ventricular end diastolic pressure. EKG today unchanged from prior and without evidence of ischemia. Initial and delta troponin also unremarkable. Given his recent normal catheterization and stable clinical appearance and the setting of a normal EKG and normal cardiac enzymes feel that his pain is again most likely related to essential hypertension setting of elevated LVEDP. Patient will be discharged to home with close cardiology follow-up. Return precautions outlined in patient discharge instructions.  Patient was discussed with and seen by Dr. Leonette Monarch who agrees with the treatment plan.    Final Clinical Impressions(s) / ED Diagnoses   Final diagnoses:  None    ED Discharge Orders    None       Wonda Cerise, Dondra Spry, PA-C 12/27/17 1800    Fatima Blank, MD 12/27/17 2112

## 2017-12-27 NOTE — ED Triage Notes (Signed)
Pt arrives from home via EMS with complaints of central chest pressure with no radiation that began at 730 this AM. Pt reports using his home nitro multiple times that past couple days. EMS gave 324 ASA.

## 2017-12-27 NOTE — Discharge Instructions (Signed)
Follow up with your cardiologist as well as your primary care provider. Take home medications as prescribed. Return to the ED if you experience severe worsening of your symptoms, loss of consciousness, weakness, difficulty breathing.

## 2017-12-29 ENCOUNTER — Ambulatory Visit: Payer: Medicaid Other | Admitting: Physical Therapy

## 2018-01-01 ENCOUNTER — Ambulatory Visit: Payer: Medicaid Other | Admitting: Physical Therapy

## 2018-01-05 ENCOUNTER — Ambulatory Visit: Payer: Medicaid Other | Admitting: Physical Therapy

## 2018-01-05 ENCOUNTER — Encounter: Payer: Self-pay | Admitting: Cardiology

## 2018-01-05 DIAGNOSIS — R2681 Unsteadiness on feet: Secondary | ICD-10-CM

## 2018-01-05 DIAGNOSIS — M545 Low back pain: Secondary | ICD-10-CM

## 2018-01-05 DIAGNOSIS — R2689 Other abnormalities of gait and mobility: Secondary | ICD-10-CM | POA: Diagnosis not present

## 2018-01-05 DIAGNOSIS — G8929 Other chronic pain: Secondary | ICD-10-CM

## 2018-01-05 NOTE — Therapy (Signed)
[facility] [address] [place], [place], [address] Phone: [phone]   Fax:  [phone]  Physical Therapy Treatment  Patient Details  Name: [name] MRN: [id number] Date of Birth: 12-27-1960 Referring Provider: Marcial Pacas   Encounter Date: 01/05/2018  PT End of Session - 01/05/18 1614    Visit Number  6    Number of Visits  12    Date for PT Re-Evaluation  02/09/18    Authorization Type  Medicaid: 3 visits initially authorized, recieved 8 additional visits from 12/26/17- 01/22/18    Authorization - Visit Number  6    Authorization - Number of Visits  11    PT Start Time  1533    PT Stop Time  1615    PT Time Calculation (min)  42 min    Equipment Utilized During Treatment  --    Activity Tolerance  Patient tolerated treatment well;Patient limited by pain    Behavior During Therapy  [facility] for tasks assessed/performed       Past Medical History:  Diagnosis Date  . Arthritis   . Back pain   . CAD (coronary artery disease)    a. s/p DES to LAD in 05/2016  . Cervical radiculopathy   . Chronic diastolic CHF (congestive heart failure) ([place])   . Chronic pain   . Depression   . DVT (deep venous thrombosis) ([place])   . Hematemesis   . Hepatic steatosis   . Hyperlipidemia   . Hypertension   . IBS (irritable bowel syndrome)   . Morbid obesity ([place])   . OSA (obstructive sleep apnea)   . Pancreatitis   . PE (pulmonary thromboembolism) ([place])   . Peripheral neuropathy   . PUD (peptic ulcer disease)   . Renal disorder   . Stroke [facility])    a. ?details unclear - not seen on imaging when he was admitted in 05/2017 for TIA symptoms which were felt due to cervical radiculopathy.  . Thoracic aortic ectasia (HCC)    a. 4.3cm ectatic ascending thoracic aorta by CT 06/2017.   . Type 2 diabetes mellitus ([place])     Past Surgical History:  Procedure Laterality Date  . CARDIAC CATHETERIZATION N/A 05/31/2016   Procedure: Left  Heart Cath and Coronary Angiography;  Surgeon: Peter M Martinique, MD;  Location: [facility] CV LAB;  Service: Cardiovascular;  Laterality: N/A;  . CARDIAC CATHETERIZATION N/A 05/31/2016   Procedure: Intravascular Pressure Wire/FFR Study;  Surgeon: Peter M Martinique, MD;  Location: [place] CV LAB;  Service: Cardiovascular;  Laterality: N/A;  . CARDIAC CATHETERIZATION N/A 05/31/2016   Procedure: Coronary Stent Intervention;  Surgeon: Peter M Martinique, MD;  Location: [place] CV LAB;  Service: Cardiovascular;  Laterality: N/A;  . LEFT HEART CATH AND CORONARY ANGIOGRAPHY N/A 12/08/2017   Procedure: LEFT HEART CATH AND CORONARY ANGIOGRAPHY;  Surgeon: Leonie Man, MD;  Location: [facility] CV LAB;  Service: Cardiovascular;  Laterality: N/A;  . LEFT HEART CATHETERIZATION WITH CORONARY ANGIOGRAM N/A 02/03/2014   Procedure: LEFT HEART CATHETERIZATION WITH CORONARY ANGIOGRAM;  Surgeon: Pixie Casino, MD;  [facility] CATH LAB;  Service: Cardiovascular;  Laterality: N/A;  . left leg stent       There were no vitals filed for this visit.  Subjective Assessment - 01/05/18 1534    Pertinent History  PMH includes CVA (last one December 2018-per patient report), arthritis R hip, DVT LLE, spinal stenosis C4-5, C5-6 with disc protrusion/no cord signal  change; multi-level degenerative changes and spinal stenosis  (Pended)     Patient Stated Goals  Pt's goals for PT are to walk normal, without pain.  (Pended)     Currently in Pain?  Yes  (Pended)     Pain Score  8   (Pended)     Pain Location  Back  (Pended)     Pain Orientation  Lower  (Pended)     Pain Type  Chronic pain  (Pended)     Pain Radiating Towards  knees   (Pended)     Pain Onset  More than a month ago  (Pended)     Pain Frequency  Intermittent  (Pended)     Pain Relieving Factors  pain meds; injections are not lasting   (Pended)     Pain Score  7  (Pended)     Pain Location  Knee  (Pended)     Pain Orientation  Right;Left  (Pended)     Pain  Descriptors / Indicators  Aching  (Pended)     Pain Type  Chronic pain  (Pended)     Pain Onset  More than a month ago  (Pended)     Pain Frequency  Constant  (Pended)                       [facility] Adult PT Treatment/Exercise - 01/05/18 1645      Lumbar Exercises: Stretches   Single Knee to Chest Stretch  Right;Left;2 reps;30 seconds    Single Knee to Chest Stretch Limitations  no strap 1st round; strap under thigh and UE assist 2nd round    Lower Trunk Rotation  1 rep;30 seconds rt and lt    Pelvic Tilt  10 reps;5 seconds          Balance Exercises - 01/05/18 1646      Balance Exercises: Standing   Standing Eyes Closed  Narrow base of support (BOS);Foam/compliant surface;30 secs intermittent UE light support    Tandem Stance  Eyes open;Foam/compliant surface;Intermittent upper extremity support blue beam in // bars    Balance Beam  blue beam crosswise, feet apart no UE support EO up to 15 sec; step on/off forward and backward 10 reps each leg        PT Education - 01/05/18 1613    Education Details  see additions to HEP     Person(s) Educated  Patient    Methods  Explanation;Demonstration;Handout       PT Short Term Goals - 12/14/17 1630      PT SHORT TERM GOAL #1   Title  Pt will be independent with HEP to address back pain, balance, and gait.  TARGET 12/15/17    Baseline  No current HEP upon eval; pt required cues for technique on 12/14/17 while assessing STGs.     Time  3    Period  Weeks    Status  Partially Met      PT SHORT TERM GOAL #2   Title  Pt will improve improve TUG score to less than or equal to 15 seconds for decreased fall risk.    Baseline  TUG 19.03 seconds (Scores >13.5 seconds indicate increased fall risk.) during eval and 12.05 sec. on 12/14/17    Time  3    Period  Weeks    Status  Achieved      PT SHORT TERM GOAL #3   Title  Pt will verbalize understanding of posture/positioning  with ADLs for improved functional mobility and  decreased pain.    Baseline  rates pain as 6-8/10 in shoulders, hip, back at eval; sits in forward head, rounded shoulders, posterior pelvic tilt posture, pt rated pain 4-5/10 in back/LEs and was able to verbalize ways to decr. pain and improve posture on 12/14/17    Time  3    Period  Weeks    Status  Achieved      PT SHORT TERM GOAL #4   Title  Pt will improve gait velocity score to at least 2.62 ft/sec for improved efficiency and safety with gait.    Baseline  Gait velocity 2.53 ft/sec at eval; scores 1.31-2.62 ft/sec indicate limited community ambulator, 3.63f/sec. and 3.488fsec. no AD on 12/14/17    Time  3    Period  Weeks    Status  Achieved        PT Long Term Goals - 12/14/17 1631      PT LONG TERM GOAL #1   Title  Pt will verbalize understanding of fall prevention in home environment.  TARGET 02/09/18    Baseline  at fall risk per DGI and TUG scores    Time  12    Period  Weeks    Status  New      PT LONG TERM GOAL #2   Title  Pt will improve DGI score to at least 19/24 for decreased fall risk.    Baseline  DGI 16/24 (scores <19/24 indicate increased fall risk)    Time  12    Period  Weeks    Status  New      PT LONG TERM GOAL #3   Title  Pt will rate at least 2 point decrease in pain scale for shoulder, hip, back pain, with functional mobility activities.    Baseline  rates pain 6-8/10 at eval    Time  12    Period  Weeks    Status  New      PT LONG TERM GOAL #4   Title  Pt will improve TUGs core to less than or equal to 13.5 seconds for decreased fall risk.    Baseline  TUG score 19.03 sec at eval, 12.05 sec. on 12/14/17    Time  12    Period  Weeks    Status  Achieved      PT LONG TERM GOAL #5   Title  Pt will verbalize plans for ongoing community fitness upon D/C from PT.    Baseline  no current HEP or community fitness routine    Time  12    Period  Weeks    Status  New            Plan - 01/05/18 1629    Clinical Impression Statement  Patient  arrived with 8/10 back pain and 7/10 bil knee pain. Patient with antalgic gait and reporting rt hip also painful. Educated on back stretches for pain relief prior to initiating balance training. Utlized hot pack to low back while stretching for incr relaxation of muscles. Patient reported pain down to 6/10 and able to participate in balance training. Patient significantly improved in balance activities on compliant surfaces. By end of session, reported back pain down to 5/10. Reiterated benefits of incr activity on pain due to arthritis, joint stiffness, or muscle spasms/tightness.     Rehab Potential  Good    PT Frequency  Other (comment) 1x/wk for 3 weeks, then 2x/wk for 4  weeks    PT Duration  Other (comment) over 12 week period    PT Treatment/Interventions  ADLs/Self Care Home Management;Electrical Stimulation;Traction;Gait training;Functional mobility training;Ultrasound;Therapeutic activities;Therapeutic exercise;Balance training;DME Instruction;Neuromuscular re-education;Stair training;Patient/family education    PT Next Visit Plan  See if has tried heat (shower or heating pad) followed by lumbar stretches. See if any ?'s. Continue to work on high level balance on uneven surfaces. Has goal related to decreased pain with functional activities--may need other postural stretching.     Consulted and Agree with Plan of Care  Patient       Patient will benefit from skilled therapeutic intervention in order to improve the following deficits and impairments:  Abnormal gait, Decreased activity tolerance, Decreased balance, Decreased mobility, Decreased knowledge of use of DME, Decreased strength, Difficulty walking, Postural dysfunction, Pain, Decreased knowledge of precautions  Visit Diagnosis: Unsteadiness on feet  Chronic right-sided low back pain without sciatica     Problem List Patient Active Problem List   Diagnosis Date Noted  . B12 deficiency 08/10/2017  . Persistent headaches  08/09/2017  . GERD (gastroesophageal reflux disease) 06/29/2017  . Left arm numbness   . Cerebral embolism with cerebral infarction 06/17/2017  . TIA (transient ischemic attack) 06/17/2017  . Chronic back pain 12/29/2016  . Depression 12/15/2016  . Vitamin D deficiency 12/09/2016  . Right hip pain 12/07/2016  . Hypokalemia 12/07/2016  . Type 2 diabetes mellitus with vascular disease ([place]) 05/31/2016  . Normocytic normochromic anemia 05/31/2016  . Chest pain 05/31/2016  . CAD (coronary artery disease) 01/06/2016  . DVT (deep venous thrombosis) ([place]) 01/06/2016  . Lactic acidosis 05/28/2014  . Nonspecific chest pain 01/29/2014  . Uncontrolled secondary diabetes with peripheral neuropathy ([place]) 01/29/2014  . Obesity, Class III, BMI 40-49.9 (morbid obesity) ([place]) 01/29/2014  . Snoring 01/29/2014  . Dyslipidemia 01/29/2014  . HTN (hypertension) 01/29/2014  . Abnormal nuclear stress test 01/29/2014    Rexanne Mano, PT 01/05/2018, 4:48 PM  [facility] [address] [place], [place], [address] Phone: [phone]   Fax:  [phone]  Name: [name] MRN: [id number] Date of Birth: 12-14-1960

## 2018-01-05 NOTE — Patient Instructions (Signed)
Pelvic Tilt: Posterior - Legs Bent (Supine)    Tighten stomach and flatten back by rolling pelvis down, pushing your low back into the bed. Hold __10__ seconds. Relax. Repeat __10__ times per set. Do __1__ sets per session.  http://orth.exer.us/202   Copyright  VHI. All rights reserved.     Knee to Chest (Flexion)    Pull knee toward chest. May use belt or towel behind your knee to help pull knee to chest. Feel stretch in lower back or buttock area. Breathing deeply, Hold _30___ seconds. Repeat with other knee. Repeat __3__ times. Do __1__ sessions per day.  http://gt2.exer.us/225   Copyright  VHI. All rights reserved.    Lower Trunk Rotation Stretch    Keeping back flat and feet together, rotate knees to left side. Hold __30__ seconds. Repeat to other side.  Repeat _3___ times per set. Do __1__ sets per session.  http://orth.exer.us/122   Copyright  VHI. All rights reserved.

## 2018-01-08 NOTE — Progress Notes (Signed)
HPI: FU CAD and diastolic CHF. Carotid Dopplers May 2017 showed 40-59% bilateral stenosis. Pt had DES to LAD 8/17. CTA 9/17 showed no pulmonary embolus. Last echocardiogram September 2018 showed normal LV function and grade 2 diastolic dysfunction.  Chest CTA September 2018 showed thoracic aorta measuring 4.3 cm.  Cardiac catheterization February 2019 showed patent stents and normal LV function.  Left ventricular end-diastolic pressure elevated at 27 mmHg.  Since last seen  patient has dyspnea with more vigorous activities but not routine activities.  No orthopnea or PND.  Occasional chest pain not exertional.  Current Outpatient Medications  Medication Sig Dispense Refill  . aspirin 81 MG chewable tablet Chew 1 tablet (81 mg total) by mouth daily. 30 tablet 0  . atorvastatin (LIPITOR) 80 MG tablet Take 1 tablet (80 mg total) by mouth daily. 90 tablet 3  . carvedilol (COREG) 25 MG tablet Take 1 tablet (25 mg total) by mouth 2 (two) times daily with a meal. 180 tablet 3  . clopidogrel (PLAVIX) 75 MG tablet Take 75 mg by mouth daily.    . DULoxetine (CYMBALTA) 60 MG capsule Take 1 capsule (60 mg total) by mouth daily. 30 capsule 12  . famotidine (PEPCID) 40 MG tablet Take 1 tablet (40 mg total) by mouth at bedtime. (Patient taking differently: Take 40 mg by mouth daily. ) 30 tablet 2  . furosemide (LASIX) 40 MG tablet Take 1 tablet (40 mg total) by mouth every evening. 30 tablet   . furosemide (LASIX) 80 MG tablet Take 1 tablet (80 mg total) by mouth daily. 30 tablet   . glipiZIDE (GLUCOTROL) 10 MG tablet Take 10 mg by mouth 2 (two) times daily.  4  . hydrALAZINE (APRESOLINE) 50 MG tablet Take 1 tablet (50 mg total) by mouth 3 (three) times daily. 270 tablet 1  . Insulin Detemir (LEVEMIR) 100 UNIT/ML Pen Inject 20 Units into the skin daily at 10 pm. (Patient taking differently: Inject 55 Units into the skin daily at 10 pm. ) 15 mL 3  . Insulin Pen Needle 31G X 5 MM MISC Use 1 needle daily to  inject insulin as prescribed 100 each 2  . isosorbide dinitrate (ISORDIL) 30 MG tablet Take 1 tablet (30 mg total) by mouth every morning. 30 tablet 0  . lisinopril (PRINIVIL,ZESTRIL) 10 MG tablet Take 1 tablet (10 mg total) by mouth daily. 30 tablet 0  . LYRICA 100 MG capsule Take 100 mg by mouth 2 (two) times daily.  4  . metFORMIN (GLUCOPHAGE-XR) 500 MG 24 hr tablet Take 2 tablets (1,000 mg total) by mouth 2 (two) times daily.    . nitroGLYCERIN (NITROSTAT) 0.4 MG SL tablet Place 0.4 mg under the tongue every 5 (five) minutes as needed for chest pain.  4  . ondansetron (ZOFRAN ODT) 4 MG disintegrating tablet Take 1 tablet (4 mg total) by mouth every 8 (eight) hours as needed for nausea or vomiting. 20 tablet 0  . ondansetron (ZOFRAN) 4 MG tablet Take 1 tablet (4 mg total) every 8 (eight) hours as needed by mouth for nausea or vomiting. 10 tablet 0  . oxyCODONE (ROXICODONE) 15 MG immediate release tablet Take 15 mg 3 (three) times daily as needed by mouth for pain.   0  . pantoprazole (PROTONIX) 40 MG tablet Take 1 tablet (40 mg total) by mouth 2 (two) times daily. 60 tablet 1  . potassium chloride SA (K-DUR,KLOR-CON) 20 MEQ tablet Take 20 mEq by mouth daily.    Marland Kitchen  sertraline (ZOLOFT) 50 MG tablet Take 50 mg by mouth daily.    . sitaGLIPtin (JANUVIA) 100 MG tablet Take 100 mg by mouth daily.    Marland Kitchen terbinafine (LAMISIL) 250 MG tablet Take 250 mg by mouth daily.  0  . tiZANidine (ZANAFLEX) 2 MG tablet Take 2 mg by mouth every 6 (six) hours as needed for muscle spasms.    . traZODone (DESYREL) 50 MG tablet Take 1 tablet (50 mg total) by mouth at bedtime. 30 tablet 11  . Vitamin D, Ergocalciferol, (DRISDOL) 50000 units CAPS capsule Take 1 capsule by mouth every Monday.  2   No current facility-administered medications for this visit.      Past Medical History:  Diagnosis Date  . Arthritis   . Back pain   . CAD (coronary artery disease)    a. s/p DES to LAD in 05/2016  . Cervical radiculopathy    . Chronic diastolic CHF (congestive heart failure) ([place])   . Chronic pain   . Depression   . DVT (deep venous thrombosis) ([place])   . Hematemesis   . Hepatic steatosis   . Hyperlipidemia   . Hypertension   . IBS (irritable bowel syndrome)   . Morbid obesity ([place])   . OSA (obstructive sleep apnea)   . Pancreatitis   . PE (pulmonary thromboembolism) ([place])   . Peripheral neuropathy   . PUD (peptic ulcer disease)   . Renal disorder   . Stroke [facility])    a. ?details unclear - not seen on imaging when he was admitted in 05/2017 for TIA symptoms which were felt due to cervical radiculopathy.  . Thoracic aortic ectasia ([facility])    a. 4.3cm ectatic ascending thoracic aorta by CT 06/2017.   . Type 2 diabetes mellitus ([place])     Past Surgical History:  Procedure Laterality Date  . CARDIAC CATHETERIZATION N/A 05/31/2016   Procedure: Left Heart Cath and Coronary Angiography;  Surgeon: Peter M Martinique, MD;  Location: [place] [facility];  Service: Cardiovascular;  Laterality: N/A;  . CARDIAC CATHETERIZATION N/A 05/31/2016   Procedure: Intravascular Pressure Wire/FFR Study;  Surgeon: Peter M Martinique, MD;  Location: [place] [facility];  Service: Cardiovascular;  Laterality: N/A;  . CARDIAC CATHETERIZATION N/A 05/31/2016   Procedure: Coronary Stent Intervention;  Surgeon: Peter M Martinique, MD;  Location: [place] [facility];  Service: Cardiovascular;  Laterality: N/A;  . LEFT HEART CATH AND CORONARY ANGIOGRAPHY N/A 12/08/2017   Procedure: LEFT HEART CATH AND CORONARY ANGIOGRAPHY;  Surgeon: Leonie Man, MD;  Location: [place] [facility];  Service: Cardiovascular;  Laterality: N/A;  . LEFT HEART CATHETERIZATION WITH CORONARY ANGIOGRAM N/A 02/03/2014   Procedure: LEFT HEART CATHETERIZATION WITH CORONARY ANGIOGRAM;  Surgeon: Pixie Casino, MD;  Location: [facility] C[facility] LAB;  Service: Cardiovascular;  Laterality: N/A;  . left leg stent       Social History   Socioeconomic History  . Marital status: Single    Spouse  name: Not on file  . Number of children: 3  . Years of education: 49  . Highest education level: Not on file  Occupational History  . Occupation: Disabled  Social Needs  . Financial resource strain: Not on file  . Food insecurity:    Worry: Not on file    Inability: Not on file  . Transportation needs:    Medical: Not on file    Non-medical: Not on file  Tobacco Use  . Smoking status: Never Smoker  . Smokeless  tobacco: Never Used  Substance and Sexual Activity  . Alcohol use: No  . Drug use: No  . Sexual activity: Not on file  Lifestyle  . Physical activity:    Days per week: Not on file    Minutes per session: Not on file  . Stress: Not on file  Relationships  . Social connections:    Talks on phone: Not on file    Gets together: Not on file    Attends religious service: Not on file    Active member of club or organization: Not on file    Attends meetings of clubs or organizations: Not on file    Relationship status: Not on file  . Intimate partner violence:    Fear of current or ex partner: Not on file    Emotionally abused: Not on file    Physically abused: Not on file    Forced sexual activity: Not on file  Other Topics Concern  . Not on file  Social History Narrative   Independent and ambulatory with cane.   Lives at home alone.   Right-handed.   1-2 cups caffeine per day.    Family History  Problem Relation Age of Onset  . Cancer Father   . Hypertension Mother   . Diabetes Mother   . Breast cancer Mother   . Hypertension Brother   . Diabetes Brother   . Hypertension Sister   . Diabetes Sister     ROS: Back and hip pain but no fevers or chills, productive cough, hemoptysis, dysphasia, odynophagia, melena, hematochezia, dysuria, hematuria, rash, seizure activity, orthopnea, PND, pedal edema, claudication. Remaining systems are negative.  Physical Exam: Well-developed morbidly obese in no acute distress.  Skin is warm and dry.  HEENT is normal.    Neck is supple.  Chest is clear to auscultation with normal expansion.  Cardiovascular exam is regular rate and rhythm.  Abdominal exam nontender or distended. No masses palpated. Extremities show trace edema. neuro grossly intact  A/P  1 coronary artery disease-continue aspirin and statin. DC plavix.  2 Thoracic aortic aneurysm-plan follow-up CTA September 2019.  3 chest pain-symptoms are chronic.  Most recent catheterization revealed patent stents.  No plans for further evaluation at this point.  4 chronic diastolic congestive heart failure-patient appears to be euvolemic on examination.  Plan continue present dose of Lasix.  Continue low-sodium diet and fluid restriction.  Check potassium and renal function.  5 hypertension-blood pressure mildly elevated.  Increase hydralazine to 75 mg 3 times daily and follow.  6 hypokalemia-recheck potassium today.  7 dyspnea-likely multifactorial including chronic diastolic congestive heart failure but also obesity hypoventilation syndrome and sleep apnea.  8 carotid artery disease-schedule follow-up carotid Dopplers.  9 Morbid obesity-we discussed importance of weight loss.  Kirk Ruths, MD

## 2018-01-10 ENCOUNTER — Encounter: Payer: Self-pay | Admitting: Physical Therapy

## 2018-01-10 ENCOUNTER — Ambulatory Visit: Payer: Medicaid Other | Admitting: Physical Therapy

## 2018-01-10 DIAGNOSIS — G8929 Other chronic pain: Secondary | ICD-10-CM

## 2018-01-10 DIAGNOSIS — M545 Low back pain, unspecified: Secondary | ICD-10-CM

## 2018-01-10 DIAGNOSIS — R2689 Other abnormalities of gait and mobility: Secondary | ICD-10-CM | POA: Diagnosis not present

## 2018-01-10 DIAGNOSIS — R2681 Unsteadiness on feet: Secondary | ICD-10-CM

## 2018-01-10 DIAGNOSIS — R293 Abnormal posture: Secondary | ICD-10-CM

## 2018-01-11 NOTE — Therapy (Signed)
[facility] [address] [place], [place], [address] Phone: [phone]   Fax:  [phone]  Physical Therapy Treatment  Patient Details  Name: [name] MRN: [id number] Date of Birth: 1961-09-26 Referring Provider: Marcial Pacas   Encounter Date: 01/10/2018  PT End of Session - 01/11/18 0746    Visit Number  7    Number of Visits  12    Date for PT Re-Evaluation  02/09/18    Authorization Type  Medicaid: 3 visits initially authorized, recieved 8 additional visits from 12/26/17- 01/22/18    Authorization - Visit Number  7    Authorization - Number of Visits  11    PT Start Time  6378    PT Stop Time  1657    PT Time Calculation (min)  42 min    Equipment Utilized During Treatment  Gait belt    Activity Tolerance  Patient tolerated treatment well;Patient limited by pain    Behavior During Therapy  [facility] for tasks assessed/performed       Past Medical History:  Diagnosis Date  . Arthritis   . Back pain   . CAD (coronary artery disease)    a. s/p DES to LAD in 05/2016  . Cervical radiculopathy   . Chronic diastolic CHF (congestive heart failure) ([place])   . Chronic pain   . Depression   . DVT (deep venous thrombosis) ([place])   . Hematemesis   . Hepatic steatosis   . Hyperlipidemia   . Hypertension   . IBS (irritable bowel syndrome)   . Morbid obesity ([place])   . OSA (obstructive sleep apnea)   . Pancreatitis   . PE (pulmonary thromboembolism) ([place])   . Peripheral neuropathy   . PUD (peptic ulcer disease)   . Renal disorder   . Stroke [facility])    a. ?details unclear - not seen on imaging when he was admitted in 05/2017 for TIA symptoms which were felt due to cervical radiculopathy.  . Thoracic aortic ectasia (H[facility])    a. 4.3cm ectatic ascending thoracic aorta by CT 06/2017.   . Type 2 diabetes mellitus ([place])     Past Surgical History:  Procedure Laterality Date  . CARDIAC CATHETERIZATION N/A 05/31/2016   Procedure: Left Heart Cath and Coronary Angiography;  Surgeon: Peter M Martinique, MD;  Location: [place] [facility];  Service: Cardiovascular;  Laterality: N/A;  . CARDIAC CATHETERIZATION N/A 05/31/2016   Procedure: Intravascular Pressure Wire/FFR Study;  Surgeon: Peter M Martinique, MD;  Location: [place] [facility];  Service: Cardiovascular;  Laterality: N/A;  . CARDIAC CATHETERIZATION N/A 05/31/2016   Procedure: Coronary Stent Intervention;  Surgeon: Peter M Martinique, MD;  Location: [place] [facility];  Service: Cardiovascular;  Laterality: N/A;  . LEFT HEART CATH AND CORONARY ANGIOGRAPHY N/A 12/08/2017   Procedure: LEFT HEART CATH AND CORONARY ANGIOGRAPHY;  Surgeon: Leonie Man, MD;  Location: [place] [facility];  Service: Cardiovascular;  Laterality: N/A;  . LEFT HEART CATHETERIZATION WITH CORONARY ANGIOGRAM N/A 02/03/2014   Procedure: LEFT HEART CATHETERIZATION WITH CORONARY ANGIOGRAM;  Surgeon: Pixie Casino, MD;  Location: [facility] CATH LAB;  Service: Cardiovascular;  Laterality: N/A;  . left leg stent       There were no vitals filed for this visit.  Subjective Assessment - 01/10/18 1617    Subjective  Pt reported no changes in medication and no falls. However , stated " I have high low back and both knee pain. Both at  9/10 pain since I haven't took any medications. Pt also stated "I did use the warm shower to help with my back pain, but it only helped a few minutes."     Pertinent History  PMH includes CVA (last one December 2018-per patient report), arthritis R hip, DVT LLE, spinal stenosis C4-5, C5-6 with disc protrusion/no cord signal change; multi-level degenerative changes and spinal stenosis    Patient Stated Goals  Pt's goals for PT are to walk normal, without pain.    Currently in Pain?  Yes    Pain Score  9     Pain Location  Back    Pain Orientation  Lower    Pain Descriptors / Indicators  Tightness;Aching    Pain Type  Chronic pain    Pain Onset  More than a month ago    Pain  Frequency  Intermittent    Aggravating Factors   walking a long time    Pain Relieving Factors  pain medication, injections     Multiple Pain Sites  Yes    Pain Score  9    Pain Location  Knee    Pain Orientation  Right;Left;Anterior    Pain Descriptors / Indicators  Aching    Pain Type  Chronic pain    Pain Onset  More than a month ago    Pain Frequency  Constant    Aggravating Factors   squatting deep and walking alot    Pain Relieving Factors  injections, laying on my back with pillows support on my head and with MHP         [facility] Adult PT Treatment/Exercise - 01/10/18 1755      Bed Mobility   Bed Mobility  Sit to Supine    Sit to Supine  5: Supervision;Other (comment) Elevated head using wedge and two pillows.     Sit to Supine - Details (indicate cue type and reason)  Performed for supine stretches and exercises.       Transfers   Transfers  Sit to Stand;Stand to Sit    Sit to Stand  6: Modified independent (Device/Increase time);5: Supervision    Sit to Stand Details  Verbal cues for sequencing;Verbal cues for technique    Sit to Stand Details (indicate cue type and reason)  Verbal and demo cues for foot positioning, neutral foot alignment, and forward momentum trunk flexion to help with energy conservation during task. Pt able to perform sit to stands/ stand to sits from PT mat without hand or UE assistance.     Stand to Sit  6: Modified independent (Device/Increase time);To chair/3-in-1;5: Supervision    Number of Reps  10 reps;2 sets Using no UE assistance.       Ambulation/Gait   Ambulation/Gait  Yes    Ambulation/Gait Assistance  5: Supervision    Ambulation Distance (Feet)  70 Feet plus 50 x 2 reps around clinic.     Assistive device  None    Gait Pattern  Step-through pattern;Lateral trunk lean to left;Antalgic;Trendelenburg;Wide base of support    Ambulation Surface  Level;Indoor      High Level Balance   High Level Balance Activities  Side stepping    High  Level Balance Comments  Performed at counter top with no to single UE support: x 3 laps each, supervision assist for balance.       Lumbar Exercises: Stretches   Single Knee to Chest Stretch  Right;Left;2 reps;30 seconds    Single Knee to Chest  Stretch Limitations  Used strap under thigh and UE assist    Lower Trunk Rotation  10 seconds;5 reps;Other (comment) 5 reps x 2.     Pelvic Tilt  10 reps;5 seconds    Standing Extension  20 seconds;3 reps;Other (comment) Using counter for stable support.       Lumbar Exercises: Standing   Wall Slides  15 reps;3 seconds;Other (comment) Cues for back and head against wall for upright posture       Knee/Hip Exercises: Standing   Heel Raises  Both;1 set;15 reps      Knee/Hip Exercises: Seated   Hamstring Curl  Strengthening;Left;Right;15 reps;Other (comment) Using blue theraband      Knee/Hip Exercises: Supine   Bridges  Strengthening;Both;10 reps;2 sets;Limitations;Other (comment) Partial and modified bridge    Other Supine Knee/Hip Exercises  Supine alternating march to 90 degrees x 10 reps. Followed by single leg marching to 90 degrees hip and knee flexion due to fatigue. Pt required verbal and tactile cues for march distance.       Modalities   Modalities  Moist Heat x 10 mins to lower back, pt in supported supine position.           PT Short Term Goals - 12/14/17 1630      PT SHORT TERM GOAL #1   Title  Pt will be independent with HEP to address back pain, balance, and gait.  TARGET 12/15/17    Baseline  No current HEP upon eval; pt required cues for technique on 12/14/17 while assessing STGs.     Time  3    Period  Weeks    Status  Partially Met      PT SHORT TERM GOAL #2   Title  Pt will improve improve TUG score to less than or equal to 15 seconds for decreased fall risk.    Baseline  TUG 19.03 seconds (Scores >13.5 seconds indicate increased fall risk.) during eval and 12.05 sec. on 12/14/17    Time  3    Period  Weeks    Status   Achieved      PT SHORT TERM GOAL #3   Title  Pt will verbalize understanding of posture/positioning with ADLs for improved functional mobility and decreased pain.    Baseline  rates pain as 6-8/10 in shoulders, hip, back at eval; sits in forward head, rounded shoulders, posterior pelvic tilt posture, pt rated pain 4-5/10 in back/LEs and was able to verbalize ways to decr. pain and improve posture on 12/14/17    Time  3    Period  Weeks    Status  Achieved      PT SHORT TERM GOAL #4   Title  Pt will improve gait velocity score to at least 2.62 ft/sec for improved efficiency and safety with gait.    Baseline  Gait velocity 2.53 ft/sec at eval; scores 1.31-2.62 ft/sec indicate limited community ambulator, 3.57f/sec. and 3.434fsec. no AD on 12/14/17    Time  3    Period  Weeks    Status  Achieved        PT Long Term Goals - 12/14/17 1631      PT LONG TERM GOAL #1   Title  Pt will verbalize understanding of fall prevention in home environment.  TARGET 02/09/18    Baseline  at fall risk per DGI and TUG scores    Time  12    Period  Weeks    Status  New  PT LONG TERM GOAL #2   Title  Pt will improve DGI score to at least 19/24 for decreased fall risk.    Baseline  DGI 16/24 (scores <19/24 indicate increased fall risk)    Time  12    Period  Weeks    Status  New      PT LONG TERM GOAL #3   Title  Pt will rate at least 2 point decrease in pain scale for shoulder, hip, back pain, with functional mobility activities.    Baseline  rates pain 6-8/10 at eval    Time  12    Period  Weeks    Status  New      PT LONG TERM GOAL #4   Title  Pt will improve TUGs core to less than or equal to 13.5 seconds for decreased fall risk.    Baseline  TUG score 19.03 sec at eval, 12.05 sec. on 12/14/17    Time  12    Period  Weeks    Status  Achieved      PT LONG TERM GOAL #5   Title  Pt will verbalize plans for ongoing community fitness upon D/C from PT.    Baseline  no current HEP or  community fitness routine    Time  12    Period  Weeks    Status  New        Plan - 01/10/18 1807    Clinical Impression Statement  Today's session focused maintaining and reducing low back pain using a MHP during supine stretches and exercises. After pain was maintained at baseline, pt was able to perform supine bilateral LE exercises, standing closed chain lower extremity exercises, and open chain lower extremity exercises. Pt was also able to perform HEP stretches and exercises during today session without any issues. Pt stated pain was a factor for not being able to perform HEP lately. Pt required minimal sitting rest breaks due to increased fatigue. Pt is making progress towards LTG's and would benefit from continued PT interventions towards  reaching unmet goals.       History and Personal Factors relevant to plan of care:  PMH; arthritis, back pain, CAD, DVT, HTN, IBS, OSA, PE, CVA, DM, MRI shows cervical spinal stenosis, disc protrusion with no cord signal damage, multilevel spinal stenosis and degenerative     Clinical Presentation  Evolving    Clinical Presentation due to:  2 falls in past 6 months; cervical spine, hip, lumbar spine pain involvement     Clinical Decision Making  Moderate    Rehab Potential  Good    PT Frequency  Other (comment)    PT Treatment/Interventions  ADLs/Self Care Home Management;Electrical Stimulation;Traction;Gait training;Functional mobility training;Ultrasound;Therapeutic activities;Therapeutic exercise;Balance training;DME Instruction;Neuromuscular re-education;Stair training;Patient/family education    PT Next Visit Plan  Follow up on pain levels after today's session. Continue to work on high level balance on uneven surfaces. Has goal related to decreased pain with functional activities--may need other postural stretching.        Patient will benefit from skilled therapeutic intervention in order to improve the following deficits and impairments:   Abnormal gait, Decreased activity tolerance, Decreased balance, Decreased mobility, Decreased knowledge of use of DME, Decreased strength, Difficulty walking, Postural dysfunction, Pain, Decreased knowledge of precautions  Visit Diagnosis: Unsteadiness on feet  Chronic right-sided low back pain without sciatica  Other abnormalities of gait and mobility  Abnormal posture     Problem List Patient Active Problem List  Diagnosis Date Noted  . B12 deficiency 08/10/2017  . Persistent headaches 08/09/2017  . GERD (gastroesophageal reflux disease) 06/29/2017  . Left arm numbness   . Cerebral embolism with cerebral infarction 06/17/2017  . TIA (transient ischemic attack) 06/17/2017  . Chronic back pain 12/29/2016  . Depression 12/15/2016  . Vitamin D deficiency 12/09/2016  . Right hip pain 12/07/2016  . Hypokalemia 12/07/2016  . Type 2 diabetes mellitus with vascular disease ([place]) 05/31/2016  . Normocytic normochromic anemia 05/31/2016  . Chest pain 05/31/2016  . CAD (coronary artery disease) 01/06/2016  . DVT (deep venous thrombosis) ([place]) 01/06/2016  . Lactic acidosis 05/28/2014  . Nonspecific chest pain 01/29/2014  . Uncontrolled secondary diabetes with peripheral neuropathy ([place]) 01/29/2014  . Obesity, Class III, BMI 40-49.9 (morbid obesity) ([place]) 01/29/2014  . Snoring 01/29/2014  . Dyslipidemia 01/29/2014  . HTN (hypertension) 01/29/2014  . Abnormal nuclear stress test 01/29/2014    Carlena Sax, SPTA 01/11/2018, 7:51 AM  [facility] [address] [place], [place], [address] Phone: [phone]   Fax:  [phone]  Name: [name] MRN: [id number] Date of Birth: 03-15-1961

## 2018-01-12 ENCOUNTER — Ambulatory Visit: Payer: Medicaid Other | Admitting: Physical Therapy

## 2018-01-12 DIAGNOSIS — G8929 Other chronic pain: Secondary | ICD-10-CM

## 2018-01-12 DIAGNOSIS — R2689 Other abnormalities of gait and mobility: Secondary | ICD-10-CM

## 2018-01-12 DIAGNOSIS — M6281 Muscle weakness (generalized): Secondary | ICD-10-CM

## 2018-01-12 DIAGNOSIS — M545 Low back pain: Secondary | ICD-10-CM

## 2018-01-12 DIAGNOSIS — R2681 Unsteadiness on feet: Secondary | ICD-10-CM

## 2018-01-12 NOTE — Therapy (Signed)
[facility] [address] [place], [place], [address] Phone: [phone]   Fax:  [phone]  Physical Therapy Treatment  Patient Details  Name: [name] MRN: [id number] Date of Birth: Feb 05, 1961 Referring Provider: Marcial Pacas   Encounter Date: 01/12/2018  PT End of Session - 01/12/18 1711    Visit Number  8    Number of Visits  12    Date for PT Re-Evaluation  02/09/18    Authorization Type  Medicaid: 3 visits initially authorized, recieved 8 additional visits from 12/26/17- 01/22/18    Authorization - Visit Number  8    Authorization - Number of Visits  11    PT Start Time  1532    PT Stop Time  1614    PT Time Calculation (min)  42 min    Equipment Utilized During Treatment  Gait belt    Activity Tolerance  Patient tolerated treatment well;Patient limited by pain    Behavior During Therapy  [facility] for tasks assessed/performed       Past Medical History:  Diagnosis Date  . Arthritis   . Back pain   . CAD (coronary artery disease)    a. s/p DES to LAD in 05/2016  . Cervical radiculopathy   . Chronic diastolic CHF (congestive heart failure) ([place])   . Chronic pain   . Depression   . DVT (deep venous thrombosis) ([place])   . Hematemesis   . Hepatic steatosis   . Hyperlipidemia   . Hypertension   . IBS (irritable bowel syndrome)   . Morbid obesity ([place])   . OSA (obstructive sleep apnea)   . Pancreatitis   . PE (pulmonary thromboembolism) ([place])   . Peripheral neuropathy   . PUD (peptic ulcer disease)   . Renal disorder   . Stroke [facility])    a. ?details unclear - not seen on imaging when he was admitted in 05/2017 for TIA symptoms which were felt due to cervical radiculopathy.  . Thoracic aortic ectasia (H[facility])    a. 4.3cm ectatic ascending thoracic aorta by CT 06/2017.   . Type 2 diabetes mellitus ([place])     Past Surgical History:  Procedure Laterality Date  . CARDIAC CATHETERIZATION N/A 05/31/2016   Procedure: Left Heart Cath and Coronary Angiography;  Surgeon: Peter M Martinique, MD;  Location: [facility] [facility];  Service: Cardiovascular;  Laterality: N/A;  . CARDIAC CATHETERIZATION N/A 05/31/2016   Procedure: Intravascular Pressure Wire/FFR Study;  Surgeon: Peter M Martinique, MD;  Location: [facility] [facility];  Service: Cardiovascular;  Laterality: N/A;  . CARDIAC CATHETERIZATION N/A 05/31/2016   Procedure: Coronary Stent Intervention;  Surgeon: Peter M Martinique, MD;  Location: [place] [facility] LAB;  Service: Cardiovascular;  Laterality: N/A;  . LEFT HEART CATH AND CORONARY ANGIOGRAPHY N/A 12/08/2017   Procedure: LEFT HEART CATH AND CORONARY ANGIOGRAPHY;  Surgeon: Leonie Man, MD;  Location: [place] CV LAB;  Service: Cardiovascular;  Laterality: N/A;  . LEFT HEART CATHETERIZATION WITH CORONARY ANGIOGRAM N/A 02/03/2014   Procedure: LEFT HEART CATHETERIZATION WITH CORONARY ANGIOGRAM;  Surgeon: Pix[facility] Casino, MD;  Location: [facility] CATH LAB;  Service: Cardiovascular;  Laterality: N/A;  . left leg stent       There were no vitals filed for this visit.  Subjective Assessment - 01/12/18 1534    Subjective  Pt reported no changes in medication and no falls. However, stated "Today I have high low back, right hip pain, and both knee pain.  I also attempted 12 flight of stairs at my apartment complex today."      Pertinent History  PMH includes CVA (last one December 2018-per patient report), arthritis R hip, DVT LLE, spinal stenosis C4-5, C5-6 with disc protrusion/no cord signal change; multi-level degenerative changes and spinal stenosis    Patient Stated Goals  Pt's goals for PT are to walk normal, without pain.    Currently in Pain?  Yes    Pain Score  7     Pain Location  Back    Pain Orientation  Lower    Pain Descriptors / Indicators  Aching;Tightness    Pain Type  Chronic pain    Pain Onset  More than a month ago    Pain Frequency  Intermittent    Pain Relieving Factors  pain medication,  injections    Multiple Pain Sites  Yes    Pain Score  7    Pain Location  Knee    Pain Orientation  Right;Left;Anterior    Pain Descriptors / Indicators  Aching    Pain Onset  More than a month ago    Pain Frequency  Constant    Aggravating Factors   squatting deep    Pain Relieving Factors  injections, laying on my back with pillow support on head    Pain Score  7    Pain Location  Hip    Pain Orientation  Right    Pain Descriptors / Indicators  Aching    Pain Type  Chronic pain    Pain Onset  More than a month ago    Pain Frequency  Constant    Aggravating Factors   stairs    Pain Relieving Factors  injections          [facility] Adult PT Treatment/Exercise - 01/12/18 1601      Bed Mobility   Bed Mobility  Sit to Supine    Sit to Supine  5: Supervision;Other (comment)    Sit to Supine - Details (indicate cue type and reason)  Performed x 1 for supine stretches, MHP to low back, and exercises      Transfers   Transfers  Sit to Stand;Stand to Sit    Sit to Stand  6: Modified independent (Device/Increase time);5: Supervision    Sit to Stand Details  Verbal cues for sequencing;Verbal cues for technique    Stand to Sit  6: Modified independent (Device/Increase time);To chair/3-in-1;5: Supervision    Number of Reps  Other reps (comment) x 5 reps during seated rest breaks.       Ambulation/Gait   Ambulation/Gait  Yes    Ambulation/Gait Assistance  5: Supervision    Ambulation Distance (Feet)  100 Feet plus 70 feet x 2    Assistive device  None    Gait Pattern  Step-through pattern;Lateral trunk lean to left;Antalgic;Trendelenburg;Wide base of support    Ambulation Surface  Level;Indoor      Lumbar Exercises: Stretches   Single Knee to Chest Stretch  Right;Left;2 reps;30 seconds    Single Knee to Chest Stretch Limitations  Used strap under thigh and UE assist; with MHP    Lower Trunk Rotation  10 seconds;5 reps;Other (comment) with MHP    Pelvic Tilt  10 reps;5 seconds;Other  (comment) with MHP      Lumbar Exercises: Aerobic   Nustep  L5 x 10 mins using UE and LE's for endurance and lower extremity strengthening; also for supportive seated position patient could tolerate.  Knee/Hip Exercises: Standing   Wall Squat  10 reps;5 seconds;1 set      Knee/Hip Exercises: Supine   Hip Adduction Isometric  Strengthening;10 reps;Both;Left;Right;Other (comment)    Hip Adduction Isometric Limitations  Performed in supine/hooklying position: hip adduction with ball squeeze along with an transverse abdominal activation, and pillow press down of UE's x 10 reps with 5 sec holds; for core strengthening and stabilzation to reduce low back pain. Progressed to hooklying hip adduction with ball squeeze, with abdominal bracing with isometric oblique press down towards pillows to x 10 reps for 5 sec holds bilaterally.     Other Supine Knee/Hip Exercises  Leg Press # 100 pounds 2 x 10 reps.       Modalities   Modalities  Moist Heat concurrent with supine exercises          PT Short Term Goals - 12/14/17 1630      PT SHORT TERM GOAL #1   Title  Pt will be independent with HEP to address back pain, balance, and gait.  TARGET 12/15/17    Baseline  No current HEP upon eval; pt required cues for technique on 12/14/17 while assessing STGs.     Time  3    Period  Weeks    Status  Partially Met      PT SHORT TERM GOAL #2   Title  Pt will improve improve TUG score to less than or equal to 15 seconds for decreased fall risk.    Baseline  TUG 19.03 seconds (Scores >13.5 seconds indicate increased fall risk.) during eval and 12.05 sec. on 12/14/17    Time  3    Period  Weeks    Status  Achieved      PT SHORT TERM GOAL #3   Title  Pt will verbalize understanding of posture/positioning with ADLs for improved functional mobility and decreased pain.    Baseline  rates pain as 6-8/10 in shoulders, hip, back at eval; sits in forward head, rounded shoulders, posterior pelvic tilt posture,  pt rated pain 4-5/10 in back/LEs and was able to verbalize ways to decr. pain and improve posture on 12/14/17    Time  3    Period  Weeks    Status  Achieved      PT SHORT TERM GOAL #4   Title  Pt will improve gait velocity score to at least 2.62 ft/sec for improved efficiency and safety with gait.    Baseline  Gait velocity 2.53 ft/sec at eval; scores 1.31-2.62 ft/sec indicate limited community ambulator, 3.32f/sec. and 3.462fsec. no AD on 12/14/17    Time  3    Period  Weeks    Status  Achieved        PT Long Term Goals - 12/14/17 1631      PT LONG TERM GOAL #1   Title  Pt will verbalize understanding of fall prevention in home environment.  TARGET 02/09/18    Baseline  at fall risk per DGI and TUG scores    Time  12    Period  Weeks    Status  New      PT LONG TERM GOAL #2   Title  Pt will improve DGI score to at least 19/24 for decreased fall risk.    Baseline  DGI 16/24 (scores <19/24 indicate increased fall risk)    Time  12    Period  Weeks    Status  New      PT LONG  TERM GOAL #3   Title  Pt will rate at least 2 point decrease in pain scale for shoulder, hip, back pain, with functional mobility activities.    Baseline  rates pain 6-8/10 at eval    Time  12    Period  Weeks    Status  New      PT LONG TERM GOAL #4   Title  Pt will improve TUGs core to less than or equal to 13.5 seconds for decreased fall risk.    Baseline  TUG score 19.03 sec at eval, 12.05 sec. on 12/14/17    Time  12    Period  Weeks    Status  Achieved      PT LONG TERM GOAL #5   Title  Pt will verbalize plans for ongoing community fitness upon D/C from PT.    Baseline  no current HEP or community fitness routine    Time  12    Period  Weeks    Status  New            Plan - 01/12/18 1712    Clinical Impression Statement  Today's skilled session focused on low back stretching with MHP, core strengthening and stabilization to help with low back pain, using of Nustep for endurance  training and BLE strengthening in a supportive seated position, and bilateral lower extermity strengthening exercises. Pt is making progress towards LTG's and would benefit towards continued PT interventions towards unmet goals.     History and Personal Factors relevant to plan of care:  PMH; arthritis, back pain, CAD, DVT, HTN, IBS, OSA, PE, CVA, DM, MRI shows cervical spinal stenosis, disc protrusion with no cord signal damage, multilevel spinal stenosis and degenerative    Clinical Presentation  Evolving    Clinical Presentation due to:  2 falls in past 6 months; cervical spine, hip, lumbar spine involvement     Clinical Decision Making  Moderate    Rehab Potential  Good    PT Frequency  Other (comment)    PT Duration  Other (comment)    PT Next Visit Plan  Follow up on pain levels after today's session. Continue to work on high level balance on uneven surfaces. Has goal related to decreased pain with functional activities--may need other postural stretching.     Consulted and Agree with Plan of Care  Patient       Patient will benefit from skilled therapeutic intervention in order to improve the following deficits and impairments:  Abnormal gait, Decreased activity tolerance, Decreased balance, Decreased mobility, Decreased knowledge of use of DME, Decreased strength, Difficulty walking, Postural dysfunction, Pain, Decreased knowledge of precautions  Visit Diagnosis: Unsteadiness on feet  Chronic right-sided low back pain without sciatica  Other abnormalities of gait and mobility  Muscle weakness (generalized)     Problem List Patient Active Problem List   Diagnosis Date Noted  . B12 deficiency 08/10/2017  . Persistent headaches 08/09/2017  . GERD (gastroesophageal reflux disease) 06/29/2017  . Left arm numbness   . Cerebral embolism with cerebral infarction 06/17/2017  . TIA (transient ischemic attack) 06/17/2017  . Chronic back pain 12/29/2016  . Depression 12/15/2016  .  Vitamin D deficiency 12/09/2016  . Right hip pain 12/07/2016  . Hypokalemia 12/07/2016  . Type 2 diabetes mellitus with vascular disease ([place]) 05/31/2016  . Normocytic normochromic anemia 05/31/2016  . Chest pain 05/31/2016  . CAD (coronary artery disease) 01/06/2016  . DVT (deep venous thrombosis) ([place]) 01/06/2016  .  Lactic acidosis 05/28/2014  . Nonspecific chest pain 01/29/2014  . Uncontrolled secondary diabetes with peripheral neuropathy ([place]) 01/29/2014  . Obesity, Class III, BMI 40-49.9 (morbid obesity) ([place]) 01/29/2014  . Snoring 01/29/2014  . Dyslipidemia 01/29/2014  . HTN (hypertension) 01/29/2014  . Abnormal nuclear stress test 01/29/2014    Carlena Sax, SPTA  01/12/2018, 5:19 PM  [facility] [address] [place], [place], [address] Phone: [phone]   Fax:  [phone]  Name: [name] MRN: [id number] Date of Birth: [birth year]

## 2018-01-15 ENCOUNTER — Encounter: Payer: Self-pay | Admitting: Cardiology

## 2018-01-15 ENCOUNTER — Ambulatory Visit (INDEPENDENT_AMBULATORY_CARE_PROVIDER_SITE_OTHER): Payer: Medicaid Other | Admitting: Cardiology

## 2018-01-15 VITALS — BP 139/78 | HR 81 | Ht 70.0 in | Wt 316.2 lb

## 2018-01-15 DIAGNOSIS — I679 Cerebrovascular disease, unspecified: Secondary | ICD-10-CM

## 2018-01-15 DIAGNOSIS — I5032 Chronic diastolic (congestive) heart failure: Secondary | ICD-10-CM

## 2018-01-15 DIAGNOSIS — I2510 Atherosclerotic heart disease of native coronary artery without angina pectoris: Secondary | ICD-10-CM

## 2018-01-15 DIAGNOSIS — I10 Essential (primary) hypertension: Secondary | ICD-10-CM | POA: Diagnosis not present

## 2018-01-15 LAB — BASIC METABOLIC PANEL
BUN/Creatinine Ratio: 17 (ref 9–20)
BUN: 11 mg/dL (ref 6–24)
CO2: 25 mmol/L (ref 20–29)
Calcium: 8.6 mg/dL — ABNORMAL LOW (ref 8.7–10.2)
Chloride: 99 mmol/L (ref 96–106)
Creatinine, Ser: 0.66 mg/dL — ABNORMAL LOW (ref 0.76–1.27)
GFR calc Af Amer: 127 mL/min/{1.73_m2} (ref >59)
GFR calc non Af Amer: 110 mL/min/{1.73_m2} (ref >59)
Glucose: 196 mg/dL — ABNORMAL HIGH (ref 65–99)
Potassium: 3 mmol/L — ABNORMAL LOW (ref 3.5–5.2)
Sodium: 141 mmol/L (ref 134–144)

## 2018-01-15 MED ORDER — HYDRALAZINE HCL 25 MG PO TABS: 75.0000 mg | ORAL_TABLET | Freq: Three times a day (TID) | ORAL | 3 refills | Status: DC

## 2018-01-15 NOTE — Patient Instructions (Signed)
Medication Instructions:   INCREASE HYDRALAZINE TO 75 MG THREE TIMES DAILY= 3 OF THE 25 MG TABLETS THREE TIMES DAILY  STOP PLAVIX  Labwork:  Your physician recommends that you HAVE LAB WORK TODAY  Testing/Procedures:  Your physician has requested that you have a carotid duplex. This test is an ultrasound of the carotid arteries in your neck. It looks at blood flow through these arteries that supply the brain with blood. Allow one hour for this exam. There are no restrictions or special instructions.    Follow-Up:  Your physician wants you to follow-up in: [place] will receive a reminder letter in the mail two months in advance. If you don't receive a letter, please call our office to schedule the follow-up appointment.   If you need a refill on your cardiac medications before your next appointment, please call your pharmacy.

## 2018-01-16 ENCOUNTER — Telehealth: Payer: Self-pay | Admitting: *Deleted

## 2018-01-16 DIAGNOSIS — E876 Hypokalemia: Secondary | ICD-10-CM

## 2018-01-16 NOTE — Telephone Encounter (Addendum)
-----   Message from Lelon Perla, MD sent at 01/16/2018  7:43 AM EDT ----- Change kdur to 40 meq daily; bmet one week Kirk Ruths  Left message for pt to call

## 2018-01-17 ENCOUNTER — Encounter: Payer: Self-pay | Admitting: Physical Therapy

## 2018-01-17 ENCOUNTER — Ambulatory Visit: Payer: Medicaid Other | Admitting: Physical Therapy

## 2018-01-17 VITALS — BP 161/74 | HR 97

## 2018-01-17 DIAGNOSIS — G8929 Other chronic pain: Secondary | ICD-10-CM

## 2018-01-17 DIAGNOSIS — R2689 Other abnormalities of gait and mobility: Secondary | ICD-10-CM | POA: Diagnosis not present

## 2018-01-17 DIAGNOSIS — M545 Low back pain, unspecified: Secondary | ICD-10-CM

## 2018-01-17 DIAGNOSIS — R2681 Unsteadiness on feet: Secondary | ICD-10-CM
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
## 2018-01-18 ENCOUNTER — Ambulatory Visit (INDEPENDENT_AMBULATORY_CARE_PROVIDER_SITE_OTHER): Payer: Medicaid Other

## 2018-01-18 DIAGNOSIS — E538 Deficiency of other specified B group vitamins: Secondary | ICD-10-CM

## 2018-01-18 MED ORDER — CYANOCOBALAMIN 1000 MCG/ML IJ SOLN
1000.0000 ug | Freq: Once | INTRAMUSCULAR | Status: AC
  Administered 2018-01-18: 1000 ug via INTRAMUSCULAR

## 2018-01-19 ENCOUNTER — Encounter (HOSPITAL_COMMUNITY): Payer: Self-pay | Admitting: Emergency Medicine

## 2018-01-19 ENCOUNTER — Ambulatory Visit: Payer: Medicaid Other | Admitting: Physical Therapy

## 2018-01-19 ENCOUNTER — Encounter: Payer: Self-pay | Admitting: Physical Therapy

## 2018-01-19 ENCOUNTER — Ambulatory Visit (HOSPITAL_COMMUNITY)
Admission: EM | Admit: 2018-01-19 | Discharge: 2018-01-19 | Disposition: A | Discharge status: Home / Self Care | Payer: Medicaid Other | Attending: Emergency Medicine | Admitting: Emergency Medicine

## 2018-01-19 DIAGNOSIS — R2689 Other abnormalities of gait and mobility: Secondary | ICD-10-CM

## 2018-01-19 DIAGNOSIS — L600 Ingrowing nail: Secondary | ICD-10-CM | POA: Diagnosis not present

## 2018-01-19 DIAGNOSIS — R2681 Unsteadiness on feet: Secondary | ICD-10-CM

## 2018-01-19 DIAGNOSIS — M6281 Muscle weakness (generalized): Secondary | ICD-10-CM

## 2018-01-19 MED ORDER — CEPHALEXIN 500 MG PO CAPS: 500.0000 mg | ORAL_CAPSULE | Freq: Four times a day (QID) | ORAL | 0 refills | Status: DC

## 2018-01-19 NOTE — Telephone Encounter (Signed)
Patient aware of lab results, MD recommendations. He is coming to the office on 4/3 for carotid doppler and would like to get repeat BMET then. Lab ordered.    Notes recorded by Lelon Perla, MD on 01/16/2018 at 7:43 AM EDT Change kdur to 40 meq daily; bmet one week Kirk [facility]s

## 2018-01-19 NOTE — Telephone Encounter (Signed)
Left message for pt to call.

## 2018-01-19 NOTE — ED Triage Notes (Signed)
Pt states he noticed the edge of his R thumb pus started leaking out. NO redness or swelling noted. Pt concerned due to diabetes hx.

## 2018-01-19 NOTE — ED Provider Notes (Signed)
[facility]    CSN: [id number] Arrival date & time: 01/19/18  1025     History   Chief Complaint Chief Complaint  Patient presents with  . Finger Injury    HPI ASWAD WANDREY is a 57 y.o. male.   Pt noticed drainage to LT thumb in the corner area with tenderness. He was concerned it was an infected ingrown nail. Pt is a diabetic and was told that he should have an ABX. Has not treated pta.      Past Medical History:  Diagnosis Date  . Arthritis   . Back pain   . CAD (coronary artery disease)    a. s/p DES to LAD in 05/2016  . Cervical radiculopathy   . Chronic diastolic CHF (congestive heart failure) ([place])   . Chronic pain   . Depression   . DVT (deep venous thrombosis) ([place])   . Hematemesis   . Hepatic steatosis   . Hyperlipidemia   . Hypertension   . IBS (irritable bowel syndrome)   . Morbid obesity ([place])   . OSA (obstructive sleep apnea)   . Pancreatitis   . PE (pulmonary thromboembolism) ([place])   . Peripheral neuropathy   . PUD (peptic ulcer disease)   . Renal disorder   . Stroke [facility])    a. ?details unclear - not seen on imaging when he was admitted in 05/2017 for TIA symptoms which were felt due to cervical radiculopathy.  . Thoracic aortic ectasia (HCC)    a. 4.3cm ectatic ascending thoracic aorta by CT 06/2017.   . Type 2 diabetes mellitus [facility])     Patient Active Problem List   Diagnosis Date Noted  . B12 deficiency 08/10/2017  . Persistent headaches 08/09/2017  . GERD (gastroesophageal reflux disease) 06/29/2017  . Left arm numbness   . Cerebral embolism with cerebral infarction 06/17/2017  . TIA (transient ischemic attack) 06/17/2017  . Chronic back pain 12/29/2016  . Depression 12/15/2016  . Vitamin D deficiency 12/09/2016  . Right hip pain 12/07/2016  . Hypokalemia 12/07/2016  . Type 2 diabetes mellitus with vascular disease ([place] ) 05/31/2016  . Normocytic normochromic anemia 05/31/2016  . Chest pain 05/31/2016  . CAD  (coronary artery disease) 01/06/2016  . DVT (deep venous thrombosis) ([place]) 01/06/2016  . Lactic acidosis 05/28/2014  . Nonspecific chest pain 01/29/2014  . Uncontrolled secondary diabetes with peripheral neuropathy ([place]) 01/29/2014  . Obesity, Class III, BMI 40-49.9 (morbid obesity) ([place]) 01/29/2014  . Snoring 01/29/2014  . Dyslipidemia 01/29/2014  . HTN (hypertension) 01/29/2014  . Abnormal nuclear stress test 01/29/2014    Past Surgical History:  Procedure Laterality Date  . CARDIAC CATHETERIZATION N/A 05/31/2016   Procedure: Left Heart Cath and Coronary Angiography;  Surgeon: Peter M Martinique, MD;  Location: [place] CV LAB;  Service: Cardiovascular;  Laterality: N/A;  . CARDIAC CATHETERIZATION N/A 05/31/2016   Procedure: Intravascular Pressure Wire/FFR Study;  Surgeon: Peter M Martinique, MD;  Location: [place] CV LAB;  Service: Cardiovascular;  Laterality: N/A;  . CARDIAC CATHETERIZATION N/A 05/31/2016   Procedure: Coronary Stent Intervention;  Surgeon: Peter M Martinique, MD;  Location: [place] CV LAB;  Service: Cardiovascular;  Laterality: N/A;  . LEFT HEART CATH AND CORONARY ANGIOGRAPHY N/A 12/08/2017   Procedure: LEFT HEART CATH AND CORONARY ANGIOGRAPHY;  Surgeon: Leonie Man, MD;  Location: [place] CV LAB;  Service: Cardiovascular;  Laterality: N/A;  . LEFT HEART CATHETERIZATION WITH CORONARY ANGIOGRAM N/A 02/03/2014   Procedure: LEFT HEART CATHETERIZATION  WITH CORONARY ANGIOGRAM;  Surgeon: Pixie Casino, MD;  Location: [facility] CATH LAB;  Service: Cardiovascular;  Laterality: N/A;  . left leg stent          Home Medications    Prior to Admission medications   Medication Sig Start Date End Date Taking? Authorizing Provider  aspirin 81 MG chewable tablet Chew 1 tablet (81 mg total) by mouth daily. 06/01/16   Elwin Mocha, MD  atorvastatin (LIPITOR) 80 MG tablet Take 1 tablet (80 mg total) by mouth daily. 01/26/17   Ahmed Prima, Fransisco Hertz, PA-C  carvedilol (COREG) 25 MG tablet  Take 1 tablet (25 mg total) by mouth 2 (two) times daily with a meal. 01/26/17   Strader, [place] M, PA-C  cephALEXin (KEFLEX) 500 MG capsule Take 1 capsule (500 mg total) by mouth 4 (four) times daily. 01/19/18   Marney Setting, NP  DULoxetine (CYMBALTA) 60 MG capsule Take 1 capsule (60 mg total) by mouth daily. 11/06/17   Marcial Pacas, MD  famotidine (PEPCID) 40 MG tablet Take 1 tablet (40 mg total) by mouth at bedtime. Patient taking differently: Take 40 mg by mouth daily.  06/18/17   Barton Dubois, MD  furosemide (LASIX) 40 MG tablet Take 1 tablet (40 mg total) by mouth every evening. 12/09/17   Velvet Bathe, MD  furosemide (LASIX) 80 MG tablet Take 1 tablet (80 mg total) by mouth daily. 12/10/17   Velvet Bathe, MD  glipiZIDE (GLUCOTROL) 10 MG tablet Take 10 mg by mouth 2 (two) times daily. 05/30/17   [provider]  hydrALAZINE (APRESOLINE) 25 MG tablet Take 3 tablets (75 mg total) by mouth 3 (three) times daily. 01/15/18   Lelon Perla, MD  Insulin Detemir (LEVEMIR) 100 UNIT/ML Pen Inject 20 Units into the skin daily at 10 pm. Patient taking differently: Inject 55 Units into the skin daily at 10 pm.  06/18/17   Barton Dubois, MD  Insulin Pen Needle 31G X 5 MM MISC Use 1 needle daily to inject insulin as prescribed 06/18/17   Barton Dubois, MD  isosorbide dinitrate (ISORDIL) 30 MG tablet Take 1 tablet (30 mg total) by mouth every morning. 12/10/17   Velvet Bathe, MD  lisinopril (PRINIVIL,ZESTRIL) 10 MG tablet Take 1 tablet (10 mg total) by mouth daily. 12/10/17   Velvet Bathe, MD  LYRICA 100 MG capsule Take 100 mg by mouth 2 (two) times daily. 11/29/17   [provider]  metFORMIN (GLUCOPHAGE-XR) 500 MG 24 hr tablet Take 2 tablets (1,000 mg total) by mouth 2 (two) times daily. 07/02/17   Barton Dubois, MD  nitroGLYCERIN (NITROSTAT) 0.4 MG SL tablet Place 0.4 mg under the tongue every 5 (five) minutes as needed for chest pain. 12/17/16   [provider]  ondansetron  (ZOFRAN ODT) 4 MG disintegrating tablet Take 1 tablet (4 mg total) by mouth every 8 (eight) hours as needed for nausea or vomiting. 06/09/17   Varney Biles, MD  ondansetron (ZOFRAN) 4 MG tablet Take 1 tablet (4 mg total) every 8 (eight) hours as needed by mouth for nausea or vomiting. 09/02/17   Harris, Vernie Shanks, PA-C  oxyCODONE (ROXICODONE) 15 MG immediate release tablet Take 15 mg 3 (three) times daily as needed by mouth for pain.  08/28/17   [provider]  pantoprazole (PROTONIX) 40 MG tablet Take 1 tablet (40 mg total) by mouth 2 (two) times daily. 06/18/17   Barton Dubois, MD  potassium chloride SA (K-DUR,KLOR-CON) 20 MEQ tablet Take 20 mEq by  mouth daily.    [provider]  sertraline (ZOLOFT) 50 MG tablet Take 50 mg by mouth daily.    [provider]  sitaGLIPtin (JANUVIA) 100 MG tablet Take 100 mg by mouth daily.    [provider]  terbinafine (LAMISIL) 250 MG tablet Take 250 mg by mouth daily. 12/07/17   [provider]  tiZANidine (ZANAFLEX) 2 MG tablet Take 2 mg by mouth every 6 (six) hours as needed for muscle spasms.    [provider]  traZODone (DESYREL) 50 MG tablet Take 1 tablet (50 mg total) by mouth at bedtime. 12/15/16   Florinda Marker, MD  Vitamin D, Ergocalciferol, (DRISDOL) 50000 units CAPS capsule Take 1 capsule by mouth every Monday. 11/06/17   [provider]    Family History Family History  Problem Relation Age of Onset  . Cancer Father   . Hypertension Mother   . Diabetes Mother   . Breast cancer Mother   . Hypertension Brother   . Diabetes Brother   . Hypertension Sister   . Diabetes Sister     Social History Social History   Tobacco Use  . Smoking status: Never Smoker  . Smokeless tobacco: Never Used  Substance Use Topics  . Alcohol use: No  . Drug use: No     Allergies   Coconut flavor [flavoring agent]; Coconut oil; Ibuprofen; Aleve [naproxen]; and Nsaids   Review of  Systems Review of Systems  Constitutional: Negative.   Respiratory: Negative.   Cardiovascular: Negative.   Musculoskeletal: Negative.   Skin:       LT thumb small amount of swelling, redness, and drainage   Neurological: Negative.      Physical Exam Triage Vital Signs ED Triage Vitals  Enc Vitals Group     BP 01/19/18 1140 (!) 150/65     Pulse Rate 01/19/18 1140 81     Resp 01/19/18 1140 16     Temp 01/19/18 1140 98.4 F (36.9 C)     Temp src --      SpO2 01/19/18 1140 96 %     Weight --      Height --      Head Circumference --      Peak Flow --      Pain Score 01/19/18 1142 0     Pain Loc --      Pain Edu? --      Excl. in [place]? --    No data found.  Updated Vital Signs BP (!) 150/65   Pulse 81   Temp 98.4 F (36.9 C)   Resp 16   SpO2 96%   Visual Acuity Right Eye Distance:   Left Eye Distance:   Bilateral Distance:    Right Eye Near:   Left Eye Near:    Bilateral Near:     Physical Exam  Constitutional: He appears well-developed.  Cardiovascular: Normal rate and regular rhythm.  Pulmonary/Chest: Effort normal and breath sounds normal.  Musculoskeletal: Normal range of motion.  Skin: There is erythema.  Lt corner of thumb has small amount of erythema, swelling. No signs of cellulitis. Full ROM,      UC Treatments / Results  Labs (all labs ordered are listed, but only abnormal results are displayed) Labs Reviewed - No data to display  EKG None Radiology No results found.  Procedures Procedures (including critical care time)  Medications Ordered in UC Medications - No data to display   Initial Impression / Assessment  and Plan / UC Course  I have reviewed the triage vital signs and the nursing notes.  Pertinent labs & imaging results that were available during my care of the patient were reviewed by me and considered in my medical decision making (see chart for details).     May use warm compressed to help promote drainage Take  tylenol as needed for discomfort Take full dose of abx with food  Keep area covered if possible exposure   Final Clinical Impressions(s) / UC Diagnoses   Final diagnoses:  Nail, ingrown    ED Discharge Orders        Ordered    cephALEXin (KEFLEX) 500 MG capsule  4 times daily     01/19/18 1203       Controlled Substance Prescriptions [place] Controlled Substance Registry consulted? Not Applicable   Marney Setting, NP 01/19/18 1208

## 2018-01-19 NOTE — Discharge Instructions (Addendum)
May use warm compressed to help promote drainage Take tylenol as needed for discomfort Take full dose of abx with food  Keep area covered if possible exposure

## 2018-01-19 NOTE — Telephone Encounter (Signed)
New Message ° ° ° °Pt is returning call  °

## 2018-01-19 NOTE — Therapy (Signed)
[facility] [address] [place], [place], [address] Phone: [phone]   Fax:  [phone]  Physical Therapy Treatment  Patient Details  Name: [name] MRN: [id number] Date of Birth: [birth year] Referring Provider: Marcial Pacas   Encounter Date: 01/19/2018  PT End of Session - 01/19/18 1536    Visit Number  10    Number of Visits  12    Date for PT Re-Evaluation  02/09/18    Authorization Type  Medicaid: 3 visits initially authorized, recieved 8 additional visits from 12/26/17- 01/22/18    Authorization - Visit Number  9    Authorization - Number of Visits  11    PT Start Time  6761    PT Stop Time  1615    PT Time Calculation (min)  42 min    Equipment Utilized During Treatment  Gait belt    Activity Tolerance  Patient tolerated treatment well;Patient limited by pain;No increased pain    Behavior During Therapy  WFL for tasks assessed/performed       Past Medical History:  Diagnosis Date  . Arthritis   . Back pain   . CAD (coronary artery disease)    a. s/p DES to LAD in 05/2016  . Cervical radiculopathy   . Chronic diastolic CHF (congestive heart failure) ([place])   . Chronic pain   . Depression   . DVT (deep venous thrombosis) ([place])   . Hematemesis   . Hepatic steatosis   . Hyperlipidemia   . Hypertension   . IBS (irritable bowel syndrome)   . Morbid obesity ([place])   . OSA (obstructive sleep apnea)   . Pancreatitis   . PE (pulmonary thromboembolism) ([place])   . Peripheral neuropathy   . PUD (peptic ulcer disease)   . Renal disorder   . Stroke [facility])    a. ?details unclear - not seen on imaging when he was admitted in 05/2017 for TIA symptoms which were felt due to cervical radiculopathy.  . Thoracic aortic ectasia (HCC)    a. 4.3cm ectatic ascending thoracic aorta by CT 06/2017.   . Type 2 diabetes mellitus ([place])     Past Surgical History:  Procedure Laterality Date  . CARDIAC CATHETERIZATION N/A  05/31/2016   Procedure: Left Heart Cath and Coronary Angiography;  Surgeon: Peter M Martinique, MD;  Location: [place] [facility];  Service: Cardiovascular;  Laterality: N/A;  . CARDIAC CATHETERIZATION N/A 05/31/2016   Procedure: Intravascular Pressure Wire/FFR Study;  Surgeon: Peter M Martinique, MD;  Location: [place] CV LAB;  Service: Cardiovascular;  Laterality: N/A;  . CARDIAC CATHETERIZATION N/A 05/31/2016   Procedure: Coronary Stent Intervention;  Surgeon: Peter M Martinique, MD;  Location: [place] CV LAB;  Service: Cardiovascular;  Laterality: N/A;  . LEFT HEART CATH AND CORONARY ANGIOGRAPHY N/A 12/08/2017   Procedure: LEFT HEART CATH AND CORONARY ANGIOGRAPHY;  Surgeon: Leonie Man, MD;  Location: [place] [facility] LAB;  Service: Cardiovascular;  Laterality: N/A;  . LEFT HEART CATHETERIZATION WITH CORONARY ANGIOGRAM N/A 02/03/2014   Procedure: LEFT HEART CATHETERIZATION WITH CORONARY ANGIOGRAM;  Surgeon: Pixie Casino, MD;  Location: [facility] CATH LAB;  Service: Cardiovascular;  Laterality: N/A;  . left leg stent       There were no vitals filed for this visit.  Subjective Assessment - 01/19/18 1534    Subjective  No new complaints. No falls to report. Was in urgent care this am due to ingrown fingernail, now on an antibiotic.  Reports the HEP is going well at home.     Pertinent History  PMH includes CVA (last one December 2018-per patient report), arthritis R hip, DVT LLE, spinal stenosis C4-5, C5-6 with disc protrusion/no cord signal change; multi-level degenerative changes and spinal stenosis    Patient Stated Goals  Pt's goals for PT are to walk normal, without pain.    Currently in Pain?  Yes    Pain Score  6     Pain Location  Generalized    Pain Descriptors / Indicators  Aching;Sore    Pain Type  Chronic pain    Pain Onset  More than a month ago    Pain Frequency  Constant    Aggravating Factors   increased activity, walking a long time    Pain Relieving Factors  pain medication,  injections          [facility] Adult PT Treatment/Exercise - 01/19/18 1537      High Level Balance   High Level Balance Activities  Side stepping;Marching forwards;Marching backwards;Tandem walking tandem fwd/bwd    High Level Balance Comments  on both red mats next to counter top: 3-4 laps each with no UE support, min guard assist with intermittent touch to counter as needed for balance.       Lumbar Exercises: Aerobic   Tread Mill  1.5-1.7 mph x 5 minutes with bil UE support, HR max 108.           Balance Exercises - 01/19/18 1551      Balance Exercises: Standing   Standing Eyes Closed  Narrow base of support (BOS);Wide (BOA);Head turns;Foam/compliant surface;Other reps (comment);30 secs;Limitations    SLS with Vectors  Foam/compliant surface;Limitations    Rockerboard  Anterior/posterior;Head turns;Lateral;EO;EC;30 seconds;10 reps;Intermittent UE support      Balance Exercises: Standing   Standing Eyes Closed Limitations  on airex in corner with chair in front for safety: wide base of support EC no head movements progressing to narrow base of support EC no head movements, progressing to wide base of support EC head movements left/right, up/down and diagonals both ways. min guard to min assist for balance.                      SLS with Vectors Limitations  on blue mat with 2 tall cones (in bars for safety): alternating fwd toe taps, then alternating cross toe taps x 10 reps each. min guard assist with intermittent touch to bars for support, cues on posture/weight shifting to assist with balance.                             Rebounder Limitations  both ways on balance board: EO rocking board with emphasis on tall posture, weight shifting; EC holding board steady, progressing to EC rocking board, progressed to holding board steady with EC for head movements left/right and up/down. min guard to min assist for balance with cues on posture/weight shifting to assist with balance and intermittent  touch to bars.          PT Short Term Goals - 12/14/17 1630      PT SHORT TERM GOAL #1   Title  Pt will be independent with HEP to address back pain, balance, and gait.  TARGET 12/15/17    Baseline  No current HEP upon eval; pt required cues for technique on 12/14/17 while assessing STGs.     Time  3  Period  Weeks    Status  Partially Met      PT SHORT TERM GOAL #2   Title  Pt will improve improve TUG score to less than or equal to 15 seconds for decreased fall risk.    Baseline  TUG 19.03 seconds (Scores >13.5 seconds indicate increased fall risk.) during eval and 12.05 sec. on 12/14/17    Time  3    Period  Weeks    Status  Achieved      PT SHORT TERM GOAL #3   Title  Pt will verbalize understanding of posture/positioning with ADLs for improved functional mobility and decreased pain.    Baseline  rates pain as 6-8/10 in shoulders, hip, back at eval; sits in forward head, rounded shoulders, posterior pelvic tilt posture, pt rated pain 4-5/10 in back/LEs and was able to verbalize ways to decr. pain and improve posture on 12/14/17    Time  3    Period  Weeks    Status  Achieved      PT SHORT TERM GOAL #4   Title  Pt will improve gait velocity score to at least 2.62 ft/sec for improved efficiency and safety with gait.    Baseline  Gait velocity 2.53 ft/sec at eval; scores 1.31-2.62 ft/sec indicate limited community ambulator, 3.67f/sec. and 3.459fsec. no AD on 12/14/17    Time  3    Period  Weeks    Status  Achieved        PT Long Term Goals - 12/14/17 1631      PT LONG TERM GOAL #1   Title  Pt will verbalize understanding of fall prevention in home environment.  TARGET 02/09/18    Baseline  at fall risk per DGI and TUG scores    Time  12    Period  Weeks    Status  New      PT LONG TERM GOAL #2   Title  Pt will improve DGI score to at least 19/24 for decreased fall risk.    Baseline  DGI 16/24 (scores <19/24 indicate increased fall risk)    Time  12    Period  Weeks     Status  New      PT LONG TERM GOAL #3   Title  Pt will rate at least 2 point decrease in pain scale for shoulder, hip, back pain, with functional mobility activities.    Baseline  rates pain 6-8/10 at eval    Time  12    Period  Weeks    Status  New      PT LONG TERM GOAL #4   Title  Pt will improve TUGs core to less than or equal to 13.5 seconds for decreased fall risk.    Baseline  TUG score 19.03 sec at eval, 12.05 sec. on 12/14/17    Time  12    Period  Weeks    Status  Achieved      PT LONG TERM GOAL #5   Title  Pt will verbalize plans for ongoing community fitness upon D/C from PT.    Baseline  no current HEP or community fitness routine    Time  12    Period  Weeks    Status  New          Plan - 01/19/18 1536    Clinical Impression Statement  Today's skilled session shifted focus to high level balance and endurace training with treadmill. Pt did need a  few seated rest breaks due to hip pain that resolved to base line with rest. Again encouraged pt to go to apartment gym and begin a fitness routine to carry over after PT. Pt is progressing toward goals.    Rehab Potential  Good    PT Frequency  Other (comment)    PT Duration  Other (comment)    PT Treatment/Interventions  ADLs/Self Care Home Management;Electrical Stimulation;Traction;Gait training;Functional mobility training;Ultrasound;Therapeutic activities;Therapeutic exercise;Balance training;DME Instruction;Neuromuscular re-education;Stair training;Patient/family education    PT Next Visit Plan  Check LTGs for discharge vs renew/ask for date extension from Medicaid. inquire if pt has gone to/used apartment gym.    Consulted and Agree with Plan of Care  Patient       Patient will benefit from skilled therapeutic intervention in order to improve the following deficits and impairments:  Abnormal gait, Decreased activity tolerance, Decreased balance, Decreased mobility, Decreased knowledge of use of DME, Decreased  strength, Difficulty walking, Postural dysfunction, Pain, Decreased knowledge of precautions  Visit Diagnosis: Unsteadiness on feet  Other abnormalities of gait and mobility  Muscle weakness (generalized)     Problem List Patient Active Problem List   Diagnosis Date Noted  . B12 deficiency 08/10/2017  . Persistent headaches 08/09/2017  . GERD (gastroesophageal reflux disease) 06/29/2017  . Left arm numbness   . Cerebral embolism with cerebral infarction 06/17/2017  . TIA (transient ischemic attack) 06/17/2017  . Chronic back pain 12/29/2016  . Depression 12/15/2016  . Vitamin D deficiency 12/09/2016  . Right hip pain 12/07/2016  . Hypokalemia 12/07/2016  . Type 2 diabetes mellitus with vascular disease ([place]) 05/31/2016  . Normocytic normochromic anemia 05/31/2016  . Chest pain 05/31/2016  . CAD (coronary artery disease) 01/06/2016  . DVT (deep venous thrombosis) ([place]) 01/06/2016  . Lactic acidosis 05/28/2014  . Nonspecific chest pain 01/29/2014  . Uncontrolled secondary diabetes with peripheral neuropathy ([place]) 01/29/2014  . Obesity, Class III, BMI 40-49.9 (morbid obesity) ([place]) 01/29/2014  . Snoring 01/29/2014  . Dyslipidemia 01/29/2014  . HTN (hypertension) 01/29/2014  . Abnormal nuclear stress test 01/29/2014    Willow Ora, PTA, [facility] [address], [place], [place] [address] [phone] 01/19/18, 4:32 PM   Name: [name] MRN: [id number] Date of Birth: 07/10/1961

## 2018-01-22 ENCOUNTER — Encounter: Payer: Self-pay | Admitting: Physical Therapy

## 2018-01-22 ENCOUNTER — Ambulatory Visit: Payer: Medicaid Other | Attending: Neurology | Admitting: Physical Therapy

## 2018-01-22 DIAGNOSIS — R2681 Unsteadiness on feet: Secondary | ICD-10-CM | POA: Diagnosis not present

## 2018-01-22 DIAGNOSIS — M6281 Muscle weakness (generalized): Secondary | ICD-10-CM | POA: Diagnosis present

## 2018-01-22 DIAGNOSIS — R2689 Other abnormalities of gait and mobility: Secondary | ICD-10-CM | POA: Diagnosis present

## 2018-01-22 NOTE — Therapy (Signed)
[facility] [address] [place], [place], [address] Phone: [phone]   Fax:  [phone]  Physical Therapy Treatment  Patient Details  Name: [name] MRN: [id number] Date of Birth: June 28, 1961 Referring Provider: Marcial Pacas   Encounter Date: 01/22/2018  PT End of Session - 01/22/18 1505    Visit Number  11    Number of Visits  12    Date for PT Re-Evaluation  02/09/18    Authorization Type  Medicaid: 3 visits initially authorized, recieved 8 additional visits from 12/26/17- 01/22/18    Authorization - Visit Number  10    Authorization - Number of Visits  11    PT Start Time  1409    PT Stop Time  1450    PT Time Calculation (min)  41 min    Equipment Utilized During Treatment  Gait belt    Activity Tolerance  Patient tolerated treatment well;Patient limited by pain    Behavior During Therapy  [facility] for tasks assessed/performed       Past Medical History:  Diagnosis Date  . Arthritis   . Back pain   . CAD (coronary artery disease)    a. s/p DES to LAD in 05/2016  . Cervical radiculopathy   . Chronic diastolic CHF (congestive heart failure) ([place])   . Chronic pain   . Depression   . DVT (deep venous thrombosis) ([place])   . Hematemesis   . Hepatic steatosis   . Hyperlipidemia   . Hypertension   . IBS (irritable bowel syndrome)   . Morbid obesity ([place])   . OSA (obstructive sleep apnea)   . Pancreatitis   . PE (pulmonary thromboembolism) ([place])   . Peripheral neuropathy   . PUD (peptic ulcer disease)   . Renal disorder   . Stroke [facility])    a. ?details unclear - not seen on imaging when he was admitted in 05/2017 for TIA symptoms which were felt due to cervical radiculopathy.  . Thoracic aortic ectasia (HCC)    a. 4.3cm ectatic ascending thoracic aorta by CT 06/2017.   . Type 2 diabetes mellitus ([place])     Past Surgical History:  Procedure Laterality Date  . CARDIAC CATHETERIZATION N/A 05/31/2016    Procedure: Left Heart Cath and Coronary Angiography;  Surgeon: Peter M Martinique, MD;  Location: [place] [facility];  Service: Cardiovascular;  Laterality: N/A;  . CARDIAC CATHETERIZATION N/A 05/31/2016   Procedure: Intravascular Pressure Wire/FFR Study;  Surgeon: Peter M Martinique, MD;  Location: [place] CV LAB;  Service: Cardiovascular;  Laterality: N/A;  . CARDIAC CATHETERIZATION N/A 05/31/2016   Procedure: Coronary Stent Intervention;  Surgeon: Peter M Martinique, MD;  Location: [place] CV LAB;  Service: Cardiovascular;  Laterality: N/A;  . LEFT HEART CATH AND CORONARY ANGIOGRAPHY N/A 12/08/2017   Procedure: LEFT HEART CATH AND CORONARY ANGIOGRAPHY;  Surgeon: Leonie Man, MD;  Location: [place] CV LAB;  Service: Cardiovascular;  Laterality: N/A;  . LEFT HEART CATHETERIZATION WITH CORONARY ANGIOGRAM N/A 02/03/2014   Procedure: LEFT HEART CATHETERIZATION WITH CORONARY ANGIOGRAM;  Surgeon: Pixie Casino, MD;  Location: [facility] CATH LAB;  Service: Cardiovascular;  Laterality: N/A;  . left leg stent       There were no vitals filed for this visit.  Subjective Assessment - 01/22/18 1410    Subjective  Think therapy has helped.  More pain today, with the cold weather.    Pertinent History  PMH includes CVA (last one  December 2018-per patient report), arthritis R hip, DVT LLE, spinal stenosis C4-5, C5-6 with disc protrusion/no cord signal change; multi-level degenerative changes and spinal stenosis    Patient Stated Goals  Pt's goals for PT are to walk normal, without pain.    Currently in Pain?  Yes    Pain Score  6     Pain Location  Generalized    Pain Descriptors / Indicators  Aching;Sore    Pain Type  Chronic pain    Pain Onset  More than a month ago    Pain Frequency  Constant    Aggravating Factors   "just about everything"-standing and sitting    Pain Relieving Factors  pain medications                       [facility] Adult PT Treatment/Exercise - 01/22/18 0001       Standardized Balance Assessment   Standardized Balance Assessment  Timed Up and Go Test;Dynamic Gait Index      Dynamic Gait Index   Level Surface  Mild Impairment    Change in Gait Speed  Normal    Gait with Horizontal Head Turns  Normal    Gait with Vertical Head Turns  Normal    Gait and Pivot Turn  Normal    Step Over Obstacle  Mild Impairment    Step Around Obstacles  Mild Impairment    Steps  Moderate Impairment    Total Score  19      Timed Up and Go Test   TUG  Normal TUG    Normal TUG (seconds)  12.75 12, 11.28 sec      Self-Care   Self-Care  Other Self-Care Comments    Other Self-Care Comments   Fall prevention education discussed; discussion regarding continued community fitness options including [employer] fitness room and pool for water aerobics.  Discussed POC, progress towards PT  and plans for d/c this visit.      Lumbar Exercises: Aerobic   Nustep  L5 x 10 mins using UE and LE's for endurance and lower extremity strengthening; also for supportive seated position. (Pace over 90-98 today)      Knee/Hip Exercises: Standing   Forward Step Up  Right;Left;1 set;5 reps;Hand Hold: 2;Step Height: 6" Pt c/o pain in R groin after 4-5 reps RLE step ups      Knee/Hip Exercises: Supine   Other Supine Knee/Hip Exercises  Leg Press 110# x 10 reps ,120 pounds x 10 reps.              PT Education - 01/22/18 1504    Education provided  Yes    Education Details  Fall prevention, [facility] fitness center and pool options for ongoing fitness; POC and plans for d/c this visit.    Person(s) Educated  Patient    Methods  Explanation;Handout    Comprehension  Verbalized understanding       PT Short Term Goals - 12/14/17 1630      PT SHORT TERM GOAL #1   Title  Pt will be independent with HEP to address back pain, balance, and gait.  TARGET 12/15/17    Baseline  No current HEP upon eval; pt required cues for technique on 12/14/17 while assessing STGs.     Time  3     Period  Weeks    Status  Partially Met      PT SHORT TERM GOAL #2   Title  Pt will improve improve TUG score to less than or equal to 15 seconds for decreased fall risk.    Baseline  TUG 19.03 seconds (Scores >13.5 seconds indicate increased fall risk.) during eval and 12.05 sec. on 12/14/17    Time  3    Period  Weeks    Status  Achieved      PT SHORT TERM GOAL #3   Title  Pt will verbalize understanding of posture/positioning with ADLs for improved functional mobility and decreased pain.    Baseline  rates pain as 6-8/10 in shoulders, hip, back at eval; sits in forward head, rounded shoulders, posterior pelvic tilt posture, pt rated pain 4-5/10 in back/LEs and was able to verbalize ways to decr. pain and improve posture on 12/14/17    Time  3    Period  Weeks    Status  Achieved      PT SHORT TERM GOAL #4   Title  Pt will improve gait velocity score to at least 2.62 ft/sec for improved efficiency and safety with gait.    Baseline  Gait velocity 2.53 ft/sec at eval; scores 1.31-2.62 ft/sec indicate limited community ambulator, 3.37f/sec. and 3.423fsec. no AD on 12/14/17    Time  3    Period  Weeks    Status  Achieved        PT Long Term Goals - 01/22/18 1413      PT LONG TERM GOAL #1   Title  Pt will verbalize understanding of fall prevention in home environment.  TARGET 02/09/18    Baseline  at fall risk per DGI and TUG scores    Time  12    Period  Weeks    Status  Achieved      PT LONG TERM GOAL #2   Title  Pt will improve DGI score to at least 19/24 for decreased fall risk.    Baseline  DGI 16/24 (scores <19/24 indicate increased fall risk); 01/22/18 19/24    Time  12    Period  Weeks    Status  Achieved      PT LONG TERM GOAL #3   Title  Pt will rate at least 2 point decrease in pain scale for shoulder, hip, back pain, with functional mobility activities.    Baseline  rates pain 6-8/10 at eval; some days pain very low 01/22/18    Time  12    Period  Weeks    Status   Partially Met      PT LONG TERM GOAL #4   Title  Pt will improve TUGs core to less than or equal to 13.5 seconds for decreased fall risk.    Baseline  TUG score 19.03 sec at eval, 12.05 sec. on 12/14/17; 11.28 sec 01/22/18    Time  12    Period  Weeks    Status  Achieved      PT LONG TERM GOAL #5   Title  Pt will verbalize plans for ongoing community fitness upon D/C from PT.    Baseline  no current HEP or community fitness routine    Time  12    Period  Weeks    Status  Achieved            Plan - 01/22/18 1506    Clinical Impression Statement  Assessed pt's LTGs this visit, with pt meeting LTG 1, 2, 4, 5.  LTG 3 partially met, with some days pain lower than others with activities.  Pt has demonstrated improvements in TUG and Dynamic Gait Index scores and has been provided information on fall prevention (discussion) and community fitness (discussion and handouts).  He has tried the treadmill in apartment complex gym, and PT provided information today on [facility] fitness center, which patient is interested in.  Pt appropriate for d/c this visit.    Rehab Potential  Good    PT Frequency  Other (comment)    PT Duration  Other (comment)    PT Treatment/Interventions  ADLs/Self Care Home Management;Electrical Stimulation;Traction;Gait training;Functional mobility training;Ultrasound;Therapeutic activities;Therapeutic exercise;Balance training;DME Instruction;Neuromuscular re-education;Stair training;Patient/family education    PT Next Visit Plan  Discharge this visit.    Consulted and Agree with Plan of Care  Patient       Patient will benefit from skilled therapeutic intervention in order to improve the following deficits and impairments:  Abnormal gait, Decreased activity tolerance, Decreased balance, Decreased mobility, Decreased knowledge of use of DME, Decreased strength, Difficulty walking, Postural dysfunction, Pain, Decreased knowledge of precautions  Visit  Diagnosis: Unsteadiness on feet  Other abnormalities of gait and mobility  Muscle weakness (generalized)     Problem List Patient Active Problem List   Diagnosis Date Noted  . B12 deficiency 08/10/2017  . Persistent headaches 08/09/2017  . GERD (gastroesophageal reflux disease) 06/29/2017  . Left arm numbness   . Cerebral embolism with cerebral infarction 06/17/2017  . TIA (transient ischemic attack) 06/17/2017  . Chronic back pain 12/29/2016  . Depression 12/15/2016  . Vitamin D deficiency 12/09/2016  . Right hip pain 12/07/2016  . Hypokalemia 12/07/2016  . Type 2 diabetes mellitus with vascular disease ([place]) 05/31/2016  . Normocytic normochromic anemia 05/31/2016  . Chest pain 05/31/2016  . CAD (coronary artery disease) 01/06/2016  . DVT (deep venous thrombosis) ([place]) 01/06/2016  . Lactic acidosis 05/28/2014  . Nonspecific chest pain 01/29/2014  . Uncontrolled secondary diabetes with peripheral neuropathy ([place]) 01/29/2014  . Obesity, Class III, BMI 40-49.9 (morbid obesity) ([place]) 01/29/2014  . Snoring 01/29/2014  . Dyslipidemia 01/29/2014  . HTN (hypertension) 01/29/2014  . Abnormal nuclear stress test 01/29/2014    Kaydense Rizo W. 01/22/2018, 3:09 PM  Frazier Butt., PT   [facility] [address] [place], [place], [address] Phone: [phone]   Fax:  [phone]  Name: NICOLAS BANH MRN: [id number] Date of Birth: [birth year]   PHYSICAL THERAPY DISCHARGE SUMMARY  Visits from Start of Care: 11  Current functional level related to goals / functional outcomes: PT Long Term Goals - 01/22/18 1413      PT LONG TERM GOAL #1   Title  Pt will verbalize understanding of fall prevention in home environment.  TARGET 02/09/18    Baseline  at fall risk per DGI and TUG scores    Time  12    Period  Weeks    Status  Achieved      PT LONG TERM GOAL #2   Title  Pt will improve DGI score to at least 19/24  for decreased fall risk.    Baseline  DGI 16/24 (scores <19/24 indicate increased fall risk); 01/22/18 19/24    Time  12    Period  Weeks    Status  Achieved      PT LONG TERM GOAL #3   Title  Pt will rate at least 2 point decrease in pain scale for shoulder, hip, back pain, with functional mobility activities.    Baseline  rates pain 6-8/10 at eval; some  days pain very low 01/22/18    Time  12    Period  Weeks    Status  Partially Met      PT LONG TERM GOAL #4   Title  Pt will improve TUGs core to less than or equal to 13.5 seconds for decreased fall risk.    Baseline  TUG score 19.03 sec at eval, 12.05 sec. on 12/14/17; 11.28 sec 01/22/18    Time  12    Period  Weeks    Status  Achieved      PT LONG TERM GOAL #5   Title  Pt will verbalize plans for ongoing community fitness upon D/C from PT.    Baseline  no current HEP or community fitness routine    Time  12    Period  Weeks    Status  Achieved      Pt has met 4 of 5 goals, with LTG partially met for fluctuating pain with functional activities.  Pt has improved TUG score and DGI to lower his fall risk throughout course of therapy.   Remaining deficits: R hip and back pain   Education / Equipment: Educated in [employer], fall prevention, community fitness.  Plan: Patient agrees to discharge.  Patient goals were met. Patient is being discharged due to meeting the stated rehab goals.  ?????   Mady Haagensen, PT 01/22/18 3:12 PM Phone: [phone] Fax: [phone]

## 2018-01-24 ENCOUNTER — Ambulatory Visit (HOSPITAL_COMMUNITY)
Admission: RE | Admit: 2018-01-24 | Discharge: 2018-01-24 | Disposition: A | Discharge status: Home / Self Care | Payer: Medicaid Other | Source: Ambulatory Visit | Attending: Cardiovascular Disease | Admitting: Cardiovascular Disease

## 2018-01-24 DIAGNOSIS — R55 Syncope and collapse: Secondary | ICD-10-CM | POA: Insufficient documentation

## 2018-01-24 DIAGNOSIS — H538 Other visual disturbances: Secondary | ICD-10-CM | POA: Insufficient documentation

## 2018-01-24 DIAGNOSIS — Z794 Long term (current) use of insulin: Secondary | ICD-10-CM | POA: Diagnosis not present

## 2018-01-24 DIAGNOSIS — R51 Headache: Secondary | ICD-10-CM | POA: Insufficient documentation

## 2018-01-24 DIAGNOSIS — I679 Cerebrovascular disease, unspecified: Secondary | ICD-10-CM | POA: Diagnosis present

## 2018-01-24 DIAGNOSIS — R42 Dizziness and giddiness: Secondary | ICD-10-CM | POA: Diagnosis not present

## 2018-01-24 DIAGNOSIS — I6521 Occlusion and stenosis of right carotid artery: Secondary | ICD-10-CM | POA: Insufficient documentation

## 2018-01-24 DIAGNOSIS — Z8673 Personal history of transient ischemic attack (TIA), and cerebral infarction without residual deficits: Secondary | ICD-10-CM | POA: Insufficient documentation

## 2018-01-24 DIAGNOSIS — E119 Type 2 diabetes mellitus without complications: Secondary | ICD-10-CM | POA: Insufficient documentation

## 2018-01-24 DIAGNOSIS — E785 Hyperlipidemia, unspecified: Secondary | ICD-10-CM | POA: Insufficient documentation

## 2018-01-24 DIAGNOSIS — I10 Essential (primary) hypertension: Secondary | ICD-10-CM | POA: Insufficient documentation

## 2018-01-24 DIAGNOSIS — R200 Anesthesia of skin: Secondary | ICD-10-CM | POA: Diagnosis not present

## 2018-01-24 LAB — BASIC METABOLIC PANEL
BUN/Creatinine Ratio: 15 (ref 9–20)
BUN: 9 mg/dL (ref 6–24)
CO2: 30 mmol/L — ABNORMAL HIGH (ref 20–29)
Calcium: 8.9 mg/dL (ref 8.7–10.2)
Chloride: 95 mmol/L — ABNORMAL LOW (ref 96–106)
Creatinine, Ser: 0.6 mg/dL — ABNORMAL LOW (ref 0.76–1.27)
GFR calc Af Amer: 132 mL/min/{1.73_m2} (ref >59)
GFR calc non Af Amer: 114 mL/min/{1.73_m2} (ref >59)
Glucose: 168 mg/dL — ABNORMAL HIGH (ref 65–99)
Potassium: 3 mmol/L — ABNORMAL LOW (ref 3.5–5.2)
Sodium: 141 mmol/L (ref 134–144)

## 2018-01-25 ENCOUNTER — Other Ambulatory Visit: Payer: Self-pay | Admitting: *Deleted

## 2018-01-25 DIAGNOSIS — Z79899 Other long term (current) drug therapy: Secondary | ICD-10-CM

## 2018-01-25 DIAGNOSIS — E876 Hypokalemia: Secondary | ICD-10-CM

## 2018-01-26 ENCOUNTER — Other Ambulatory Visit: Payer: Self-pay | Admitting: *Deleted

## 2018-01-26 DIAGNOSIS — Z79899 Other long term (current) drug therapy: Secondary | ICD-10-CM

## 2018-01-26 DIAGNOSIS — E876 Hypokalemia: Secondary | ICD-10-CM

## 2018-01-30 ENCOUNTER — Other Ambulatory Visit: Payer: Self-pay | Admitting: *Deleted

## 2018-01-30 MED ORDER — HYDRALAZINE HCL 25 MG PO TABS: 75.0000 mg | ORAL_TABLET | Freq: Three times a day (TID) | ORAL | 3 refills | Status: DC

## 2018-02-02 ENCOUNTER — Encounter (HOSPITAL_COMMUNITY): Payer: Self-pay | Admitting: Family Medicine

## 2018-02-02 ENCOUNTER — Ambulatory Visit (HOSPITAL_COMMUNITY)
Admission: EM | Admit: 2018-02-02 | Discharge: 2018-02-02 | Disposition: A | Discharge status: Home / Self Care | Payer: Medicaid Other | Attending: Family Medicine | Admitting: Family Medicine

## 2018-02-02 DIAGNOSIS — M25551 Pain in right hip: Secondary | ICD-10-CM | POA: Diagnosis not present

## 2018-02-02 DIAGNOSIS — I10 Essential (primary) hypertension: Secondary | ICD-10-CM

## 2018-02-02 MED ORDER — KETOROLAC TROMETHAMINE 60 MG/2ML IM SOLN
INTRAMUSCULAR | Status: AC
  Filled 2018-02-02: qty 2

## 2018-02-02 MED ORDER — KETOROLAC TROMETHAMINE 60 MG/2ML IM SOLN
60.0000 mg | Freq: Once | INTRAMUSCULAR | Status: AC
  Administered 2018-02-02: 60 mg via INTRAMUSCULAR

## 2018-02-02 NOTE — ED Provider Notes (Signed)
[place]    CSN: [id number] Arrival date & time: 02/02/18  1655     History   Chief Complaint Chief Complaint  Patient presents with  . Hip Pain    HPI ALANDIS BLUEMEL is a 57 y.o. male.   57 yo male with arthritis and chronic HTN here for right hip pain that is acute on chronic. He says toradol usually helps and requests this today. He denies headache or vision changes.      Past Medical History:  Diagnosis Date  . Arthritis   . Back pain   . CAD (coronary artery disease)    a. s/p DES to LAD in 05/2016  . Cervical radiculopathy   . Chronic diastolic CHF (congestive heart failure) ([place])   . Chronic pain   . Depression   . DVT (deep venous thrombosis) ([place])   . Hematemesis   . Hepatic steatosis   . Hyperlipidemia   . Hypertension   . IBS (irritable bowel syndrome)   . Morbid obesity ([place])   . OSA (obstructive sleep apnea)   . Pancreatitis   . PE (pulmonary thromboembolism) ([place])   . Peripheral neuropathy   . PUD (peptic ulcer disease)   . Renal disorder   . Stroke [facility])    a. ?details unclear - not seen on imaging when he was admitted in 05/2017 for TIA symptoms which were felt due to cervical radiculopathy.  . Thoracic aortic ectasia (HCC)    a. 4.3cm ectatic ascending thoracic aorta by CT 06/2017.   . Type 2 diabetes mellitus [facility])     Patient Active Problem List   Diagnosis Date Noted  . B12 deficiency 08/10/2017  . Persistent headaches 08/09/2017  . GERD (gastroesophageal reflux disease) 06/29/2017  . Left arm numbness   . Cerebral embolism with cerebral infarction 06/17/2017  . TIA (transient ischemic attack) 06/17/2017  . Chronic back pain 12/29/2016  . Depression 12/15/2016  . Vitamin D deficiency 12/09/2016  . Right hip pain 12/07/2016  . Hypokalemia 12/07/2016  . Type 2 diabetes mellitus with vascular disease ([place]) 05/31/2016  . Normocytic normochromic anemia 05/31/2016  . Chest pain 05/31/2016  . CAD (coronary artery  disease) 01/06/2016  . DVT (deep venous thrombosis) ([place]) 01/06/2016  . Lactic acidosis 05/28/2014  . Nonspecific chest pain 01/29/2014  . Uncontrolled secondary diabetes with peripheral neuropathy ([place]) 01/29/2014  . Obesity, Class III, BMI 40-49.9 (morbid obesity) ([place]) 01/29/2014  . Snoring 01/29/2014  . Dyslipidemia 01/29/2014  . HTN (hypertension) 01/29/2014  . Abnormal nuclear stress test 01/29/2014    Past Surgical History:  Procedure Laterality Date  . CARDIAC CATHETERIZATION N/A 05/31/2016   Procedure: Left Heart Cath and Coronary Angiography;  Surgeon: Peter M Martinique, MD;  Location: [place] CV LAB;  Service: Cardiovascular;  Laterality: N/A;  . CARDIAC CATHETERIZATION N/A 05/31/2016   Procedure: Intravascular Pressure Wire/FFR Study;  Surgeon: Peter M Martinique, MD;  Location: [place] CV LAB;  Service: Cardiovascular;  Laterality: N/A;  . CARDIAC CATHETERIZATION N/A 05/31/2016   Procedure: Coronary Stent Intervention;  Surgeon: Peter M Martinique, MD;  Location: [place] CV LAB;  Service: Cardiovascular;  Laterality: N/A;  . LEFT HEART CATH AND CORONARY ANGIOGRAPHY N/A 12/08/2017   Procedure: LEFT HEART CATH AND CORONARY ANGIOGRAPHY;  Surgeon: Leonie Man, MD;  Location: [place] CV LAB;  Service: Cardiovascular;  Laterality: N/A;  . LEFT HEART CATHETERIZATION WITH CORONARY ANGIOGRAM N/A 02/03/2014   Procedure: LEFT HEART CATHETERIZATION WITH CORONARY ANGIOGRAM;  Surgeon:  Pixie Casino, MD;  Location: [facility] CATH LAB;  Service: Cardiovascular;  Laterality: N/A;  . left leg stent          Home Medications    Prior to Admission medications   Medication Sig Start Date End Date Taking? Authorizing Provider  aspirin 81 MG chewable tablet Chew 1 tablet (81 mg total) by mouth daily. 06/01/16   Elwin Mocha, MD  atorvastatin (LIPITOR) 80 MG tablet Take 1 tablet (80 mg total) by mouth daily. 01/26/17   Ahmed Prima, Fransisco Hertz, PA-C  carvedilol (COREG) 25 MG tablet Take 1 tablet  (25 mg total) by mouth 2 (two) times daily with a meal. 01/26/17   Strader, [place] M, PA-C  DULoxetine (CYMBALTA) 60 MG capsule Take 1 capsule (60 mg total) by mouth daily. 11/06/17   Marcial Pacas, MD  famotidine (PEPCID) 40 MG tablet Take 1 tablet (40 mg total) by mouth at bedtime. Patient taking differently: Take 40 mg by mouth daily.  06/18/17   Barton Dubois, MD  furosemide (LASIX) 40 MG tablet Take 1 tablet (40 mg total) by mouth every evening. 12/09/17   Velvet Bathe, MD  furosemide (LASIX) 80 MG tablet Take 1 tablet (80 mg total) by mouth daily. 12/10/17   Velvet Bathe, MD  glipiZIDE (GLUCOTROL) 10 MG tablet Take 10 mg by mouth 2 (two) times daily. 05/30/17   [provider]  hydrALAZINE (APRESOLINE) 25 MG tablet Take 3 tablets (75 mg total) by mouth 3 (three) times daily. 01/30/18   Barrett, Evelene Croon, PA-C  Insulin Detemir (LEVEMIR) 100 UNIT/ML Pen Inject 20 Units into the skin daily at 10 pm. Patient taking differently: Inject 55 Units into the skin daily at 10 pm.  06/18/17   Barton Dubois, MD  Insulin Pen Needle 31G X 5 MM MISC Use 1 needle daily to inject insulin as prescribed 06/18/17   Barton Dubois, MD  isosorbide dinitrate (ISORDIL) 30 MG tablet Take 1 tablet (30 mg total) by mouth every morning. 12/10/17   Velvet Bathe, MD  lisinopril (PRINIVIL,ZESTRIL) 10 MG tablet Take 1 tablet (10 mg total) by mouth daily. 12/10/17   Velvet Bathe, MD  LYRICA 100 MG capsule Take 100 mg by mouth 2 (two) times daily. 11/29/17   [provider]  metFORMIN (GLUCOPHAGE-XR) 500 MG 24 hr tablet Take 2 tablets (1,000 mg total) by mouth 2 (two) times daily. 07/02/17   Barton Dubois, MD  nitroGLYCERIN (NITROSTAT) 0.4 MG SL tablet Place 0.4 mg under the tongue every 5 (five) minutes as needed for chest pain. 12/17/16   [provider]  ondansetron (ZOFRAN ODT) 4 MG disintegrating tablet Take 1 tablet (4 mg total) by mouth every 8 (eight) hours as needed for nausea or vomiting. 06/09/17    Varney Biles, MD  ondansetron (ZOFRAN) 4 MG tablet Take 1 tablet (4 mg total) every 8 (eight) hours as needed by mouth for nausea or vomiting. 09/02/17   Harris, Vernie Shanks, PA-C  oxyCODONE (ROXICODONE) 15 MG immediate release tablet Take 15 mg 3 (three) times daily as needed by mouth for pain.  08/28/17   [provider]  pantoprazole (PROTONIX) 40 MG tablet Take 1 tablet (40 mg total) by mouth 2 (two) times daily. 06/18/17   Barton Dubois, MD  potassium chloride SA (K-DUR,KLOR-CON) 20 MEQ tablet Take 40 mEq by mouth daily.     [provider]  sertraline (ZOLOFT) 50 MG tablet Take 50 mg by mouth daily.    [provider]  sitaGLIPtin Celesta Gentile)  100 MG tablet Take 100 mg by mouth daily.    [provider]  terbinafine (LAMISIL) 250 MG tablet Take 250 mg by mouth daily. 12/07/17   [provider]  tiZANidine (ZANAFLEX) 2 MG tablet Take 2 mg by mouth every 6 (six) hours as needed for muscle spasms.    [provider]  traZODone (DESYREL) 50 MG tablet Take 1 tablet (50 mg total) by mouth at bedtime. 12/15/16   Florinda Marker, MD  Vitamin D, Ergocalciferol, (DRISDOL) 50000 units CAPS capsule Take 1 capsule by mouth every Monday. 11/06/17   [provider]    Family History Family History  Problem Relation Age of Onset  . Cancer Father   . Hypertension Mother   . Diabetes Mother   . Breast cancer Mother   . Hypertension Brother   . Diabetes Brother   . Hypertension Sister   . Diabetes Sister     Social History Social History   Tobacco Use  . Smoking status: Never Smoker  . Smokeless tobacco: Never Used  Substance Use Topics  . Alcohol use: No  . Drug use: No     Allergies   Coconut flavor [flavoring agent]; Coconut oil; Ibuprofen; Aleve [naproxen]; and Nsaids   Review of Systems Review of Systems  Constitutional: Negative for activity change and appetite change.  HENT: Negative for congestion and ear discharge.     Eyes: Negative for discharge and itching.  Respiratory: Negative for apnea and chest tightness.   Cardiovascular: Negative for chest pain and leg swelling.  Gastrointestinal: Negative for abdominal distention and abdominal pain.  Endocrine: Negative for cold intolerance and heat intolerance.  Genitourinary: Negative for difficulty urinating and dysuria.  Musculoskeletal:       Hip pain  Neurological: Negative for dizziness and headaches.  Hematological: Negative for adenopathy. Does not bruise/bleed easily.     Physical Exam Triage Vital Signs ED Triage Vitals  Enc Vitals Group     BP 02/02/18 1720 (!) 172/96     Pulse Rate 02/02/18 1720 82     Resp 02/02/18 1720 18     Temp 02/02/18 1720 98.6 F (37 C)     Temp Source 02/02/18 1720 Oral     SpO2 02/02/18 1720 95 %     Weight --      Height --      Head Circumference --      Peak Flow --      Pain Score 02/02/18 1724 6     Pain Loc --      Pain Edu? --      Excl. in [place]? --    No data found.  Updated Vital Signs BP (!) 172/96 (BP Location: Left Arm) Comment: reported BP to Nurse Traci Bast  Pulse 82   Temp 98.6 F (37 C) (Oral)   Resp 18   SpO2 95%   Visual Acuity Right Eye Distance:   Left Eye Distance:   Bilateral Distance:    Right Eye Near:   Left Eye Near:    Bilateral Near:     Physical Exam  Constitutional: He is oriented to person, place, and time. He appears well-developed and well-nourished.  HENT:  Head: Normocephalic and atraumatic.  Eyes: Pupils are equal, round, and reactive to light. EOM are normal.  Neck: Normal range of motion. Neck supple.  Pulmonary/Chest: [place] normal. No respiratory distress.  Musculoskeletal: Normal range of motion. He exhibits no edema, tenderness or deformity.  Neurological: He is  alert and oriented to person, place, and time.  Skin: Skin is warm and dry.  Psychiatric: He has a normal mood and affect. His behavior is normal.     UC Treatments / Results   Labs (all labs ordered are listed, but only abnormal results are displayed) Labs Reviewed - No data to display  EKG None Radiology No results found.  Procedures Procedures (including critical care time)  Medications Ordered in UC Medications - No data to display   Initial Impression / Assessment and Plan / UC Course  I have reviewed the triage vital signs and the nursing notes.  Pertinent labs & imaging results that were available during my care of the patient were reviewed by me and considered in my medical decision making (see chart for details).     1. Right hip pain- toradol for pain 2. Chronic HTN with elevated blood pressure today- asymptomatic. follow up with PCP  Final Clinical Impressions(s) / UC Diagnoses   Final diagnoses:  None    ED Discharge Orders    None       Controlled Substance Prescriptions [place] Controlled Substance Registry consulted? Not Applicable   Dannielle Huh, DO 02/02/18 1807

## 2018-02-02 NOTE — ED Triage Notes (Signed)
Pt here for right hip pain. He reports that he feels as if its grinding or out of socket. He has had a Toradol shot in the past that has helped.

## 2018-02-19 ENCOUNTER — Ambulatory Visit (INDEPENDENT_AMBULATORY_CARE_PROVIDER_SITE_OTHER): Payer: Medicaid Other | Admitting: *Deleted

## 2018-02-19 DIAGNOSIS — E538 Deficiency of other specified B group vitamins: Secondary | ICD-10-CM | POA: Diagnosis not present

## 2018-02-19 MED ORDER — CYANOCOBALAMIN 1000 MCG/ML IJ SOLN
1000.0000 ug | Freq: Once | INTRAMUSCULAR | Status: AC
  Administered 2018-02-19: 1000 ug via INTRAMUSCULAR

## 2018-02-19 NOTE — Progress Notes (Signed)
Gave Vit B12 IM in left deltoid. Cleaned with alcohol wipe prior to injection. Band-aid applied. Pt tolerated well.

## 2018-02-23 ENCOUNTER — Encounter (HOSPITAL_COMMUNITY): Payer: Self-pay | Admitting: Emergency Medicine

## 2018-02-23 ENCOUNTER — Ambulatory Visit (HOSPITAL_COMMUNITY)
Admission: EM | Admit: 2018-02-23 | Discharge: 2018-02-23 | Disposition: A | Discharge status: Home / Self Care | Payer: Medicaid Other | Attending: Family Medicine | Admitting: Family Medicine

## 2018-02-23 DIAGNOSIS — J111 Influenza due to unidentified influenza virus with other respiratory manifestations: Secondary | ICD-10-CM

## 2018-02-23 DIAGNOSIS — R69 Illness, unspecified: Secondary | ICD-10-CM

## 2018-02-23 MED ORDER — OSELTAMIVIR PHOSPHATE 75 MG PO CAPS: 75.0000 mg | ORAL_CAPSULE | Freq: Two times a day (BID) | ORAL | 0 refills | Status: DC

## 2018-02-23 MED ORDER — BENZONATATE 200 MG PO CAPS: 200.0000 mg | ORAL_CAPSULE | Freq: Two times a day (BID) | ORAL | 0 refills | Status: DC | PRN

## 2018-02-23 MED ORDER — CETIRIZINE HCL 10 MG PO TABS: 10.0000 mg | ORAL_TABLET | Freq: Every day | ORAL | 0 refills | Status: DC

## 2018-02-23 NOTE — Discharge Instructions (Addendum)
Make sure you drink plenty of fluids Take the Tamiflu twice a day for 5 days Take cetirizine (generic Zyrtec) daily to help with runny nose Take Tessalon twice a day for cough until symptoms resolve Follow-up with your PCP if not improving by Monday

## 2018-02-23 NOTE — ED Provider Notes (Signed)
[place]    CSN: [id number] Arrival date & time: 02/23/18  1046     History   Chief Complaint Chief Complaint  Patient presents with  . URI    HPI [name] is a 57 y.o. male.   HPI  Chart is reviewed.  This is an insulin-dependent diabetic with hypertension hyperlipidemia and heart disease.  He states he is compliant with his medications.  He states last time he had symptoms similar, he ended up in ketoacidosis and hospitalization.  He is here today with cough cold runny nose symptoms.  It started 2 days ago.  He is coughing so hard that he vomits.  He feels significantly fatigued.  His whole body hurts.  Symptoms came on suddenly.  He feels like he has "the flu".  No flu exposure known.  The patient is out a lot socially.  He did not get a flu shot.  He has clear mucus from his nose and sinuses.  Some postnasal drip.  Some sore throat.  Moderate headache.  Cough and congestion.  No chest pain.  Currently no nausea or vomiting, he is keeping down liquids well.  Past Medical History:  Diagnosis Date  . Arthritis   . Back pain   . CAD (coronary artery disease)    a. s/p DES to LAD in 05/2016  . Cervical radiculopathy   . Chronic diastolic CHF (congestive heart failure) ([place])   . Chronic pain   . Depression   . DVT (deep venous thrombosis) ([place])   . Hematemesis   . Hepatic steatosis   . Hyperlipidemia   . Hypertension   . IBS (irritable bowel syndrome)   . Morbid obesity ([place])   . OSA (obstructive sleep apnea)   . Pancreatitis   . PE (pulmonary thromboembolism) ([place])   . Peripheral neuropathy   . PUD (peptic ulcer disease)   . Renal disorder   . Stroke [facility])    a. ?details unclear - not seen on imaging when he was admitted in 05/2017 for TIA symptoms which were felt due to cervical radiculopathy.  . Thoracic aortic ectasia (HCC)    a. 4.3cm ectatic ascending thoracic aorta by CT 06/2017.   . Type 2 diabetes mellitus [facility])     Patient Active  Problem List   Diagnosis Date Noted  . B12 deficiency 08/10/2017  . Persistent headaches 08/09/2017  . GERD (gastroesophageal reflux disease) 06/29/2017  . Left arm numbness   . Cerebral embolism with cerebral infarction 06/17/2017  . TIA (transient ischemic attack) 06/17/2017  . Chronic back pain 12/29/2016  . Depression 12/15/2016  . Vitamin D deficiency 12/09/2016  . Right hip pain 12/07/2016  . Hypokalemia 12/07/2016  . Type 2 diabetes mellitus with vascular disease ([place]) 05/31/2016  . Normocytic normochromic anemia 05/31/2016  . Chest pain 05/31/2016  . CAD (coronary artery disease) 01/06/2016  . DVT (deep venous thrombosis) ([place]) 01/06/2016  . Lactic acidosis 05/28/2014  . Nonspecific chest pain 01/29/2014  . Uncontrolled secondary diabetes with peripheral neuropathy ([place]) 01/29/2014  . Obesity, Class III, BMI 40-49.9 (morbid obesity) ([place]) 01/29/2014  . Snoring 01/29/2014  . Dyslipidemia 01/29/2014  . HTN (hypertension) 01/29/2014  . Abnormal nuclear stress test 01/29/2014    Past Surgical History:  Procedure Laterality Date  . CARDIAC CATHETERIZATION N/A 05/31/2016   Procedure: Left Heart Cath and Coronary Angiography;  Surgeon: Peter M Martinique, MD;  Location: [place] CV LAB;  Service: Cardiovascular;  Laterality: N/A;  . CARDIAC CATHETERIZATION  N/A 05/31/2016   Procedure: Intravascular Pressure Wire/FFR Study;  Surgeon: Peter M Martinique, MD;  Location: [place] CV LAB;  Service: Cardiovascular;  Laterality: N/A;  . CARDIAC CATHETERIZATION N/A 05/31/2016   Procedure: Coronary Stent Intervention;  Surgeon: Peter M Martinique, MD;  Location: [place] CV LAB;  Service: Cardiovascular;  Laterality: N/A;  . LEFT HEART CATH AND CORONARY ANGIOGRAPHY N/A 12/08/2017   Procedure: LEFT HEART CATH AND CORONARY ANGIOGRAPHY;  Surgeon: Leonie Man, MD;  Location: [place] CV LAB;  Service: Cardiovascular;  Laterality: N/A;  . LEFT HEART CATHETERIZATION WITH CORONARY ANGIOGRAM N/A  02/03/2014   Procedure: LEFT HEART CATHETERIZATION WITH CORONARY ANGIOGRAM;  Surgeon: Pixie Casino, MD;  Location: [facility] CATH LAB;  Service: Cardiovascular;  Laterality: N/A;  . left leg stent          Home Medications    Prior to Admission medications   Medication Sig Start Date End Date Taking? Authorizing Provider  aspirin 81 MG chewable tablet Chew 1 tablet (81 mg total) by mouth daily. 06/01/16   Elwin Mocha, MD  atorvastatin (LIPITOR) 80 MG tablet Take 1 tablet (80 mg total) by mouth daily. 01/26/17   Strader, Fransisco Hertz, PA-C  benzonatate (TESSALON) 200 MG capsule Take 1 capsule (200 mg total) by mouth 2 (two) times daily as needed for cough. 02/23/18   Raylene Everts, MD  carvedilol (COREG) 25 MG tablet Take 1 tablet (25 mg total) by mouth 2 (two) times daily with a meal. 01/26/17   Strader, [place] M, PA-C  cetirizine (ZYRTEC) 10 MG tablet Take 1 tablet (10 mg total) by mouth daily. 02/23/18   Raylene Everts, MD  DULoxetine (CYMBALTA) 60 MG capsule Take 1 capsule (60 mg total) by mouth daily. 11/06/17   Marcial Pacas, MD  famotidine (PEPCID) 40 MG tablet Take 1 tablet (40 mg total) by mouth at bedtime. Patient taking differently: Take 40 mg by mouth daily.  06/18/17   Barton Dubois, MD  furosemide (LASIX) 40 MG tablet Take 1 tablet (40 mg total) by mouth every evening. 12/09/17   Velvet Bathe, MD  furosemide (LASIX) 80 MG tablet Take 1 tablet (80 mg total) by mouth daily. 12/10/17   Velvet Bathe, MD  glipiZIDE (GLUCOTROL) 10 MG tablet Take 10 mg by mouth 2 (two) times daily. 05/30/17   [provider]  hydrALAZINE (APRESOLINE) 25 MG tablet Take 3 tablets (75 mg total) by mouth 3 (three) times daily. 01/30/18   Barrett, Evelene Croon, PA-C  Insulin Detemir (LEVEMIR) 100 UNIT/ML Pen Inject 20 Units into the skin daily at 10 pm. Patient taking differently: Inject 55 Units into the skin daily at 10 pm.  06/18/17   Barton Dubois, MD  Insulin Pen Needle 31G X 5 MM MISC Use 1 needle daily  to inject insulin as prescribed 06/18/17   Barton Dubois, MD  isosorbide dinitrate (ISORDIL) 30 MG tablet Take 1 tablet (30 mg total) by mouth every morning. 12/10/17   Velvet Bathe, MD  lisinopril (PRINIVIL,ZESTRIL) 10 MG tablet Take 1 tablet (10 mg total) by mouth daily. 12/10/17   Velvet Bathe, MD  LYRICA 100 MG capsule Take 100 mg by mouth 2 (two) times daily. 11/29/17   [provider]  metFORMIN (GLUCOPHAGE-XR) 500 MG 24 hr tablet Take 2 tablets (1,000 mg total) by mouth 2 (two) times daily. 07/02/17   Barton Dubois, MD  nitroGLYCERIN (NITROSTAT) 0.4 MG SL tablet Place 0.4 mg under the tongue every 5 (five) minutes as  needed for chest pain. 12/17/16   [provider]  ondansetron (ZOFRAN ODT) 4 MG disintegrating tablet Take 1 tablet (4 mg total) by mouth every 8 (eight) hours as needed for nausea or vomiting. 06/09/17   Varney Biles, MD  ondansetron (ZOFRAN) 4 MG tablet Take 1 tablet (4 mg total) every 8 (eight) hours as needed by mouth for nausea or vomiting. 09/02/17   [name], PA-C  oseltamivir (TAMIFLU) 75 MG capsule Take 1 capsule (75 mg total) by mouth every 12 (twelve) hours. 02/23/18   Raylene Everts, MD  oxyCODONE (ROXICODONE) 15 MG immediate release tablet Take 15 mg 3 (three) times daily as needed by mouth for pain.  08/28/17   [provider]  pantoprazole (PROTONIX) 40 MG tablet Take 1 tablet (40 mg total) by mouth 2 (two) times daily. 06/18/17   Barton Dubois, MD  potassium chloride SA (K-DUR,KLOR-CON) 20 MEQ tablet Take 40 mEq by mouth daily.     [provider]  sertraline (ZOLOFT) 50 MG tablet Take 50 mg by mouth daily.    [provider]  sitaGLIPtin (JANUVIA) 100 MG tablet Take 100 mg by mouth daily.    [provider]  terbinafine (LAMISIL) 250 MG tablet Take 250 mg by mouth daily. 12/07/17   [provider]  tiZANidine (ZANAFLEX) 2 MG tablet Take 2 mg by mouth every 6 (six) hours as needed for muscle  spasms.    [provider]  traZODone (DESYREL) 50 MG tablet Take 1 tablet (50 mg total) by mouth at bedtime. 12/15/16   Florinda Marker, MD  Vitamin D, Ergocalciferol, (DRISDOL) 50000 units CAPS capsule Take 1 capsule by mouth every Monday. 11/06/17   [provider]    Family History Family History  Problem Relation Age of Onset  . Cancer Father   . Hypertension Mother   . Diabetes Mother   . Breast cancer Mother   . Hypertension Brother   . Diabetes Brother   . Hypertension Sister   . Diabetes Sister     Social History Social History   Tobacco Use  . Smoking status: Never Smoker  . Smokeless tobacco: Never Used  Substance Use Topics  . Alcohol use: No  . Drug use: No     Allergies   Coconut flavor [flavoring agent]; Coconut oil; Ibuprofen; Aleve [naproxen]; and Nsaids   Review of Systems Review of Systems  Constitutional: Positive for chills and fatigue. Negative for fever.       Sweats and chills all night, did not take temp.  Took Tylenol prior to his visit today.  HENT: Positive for congestion, postnasal drip and rhinorrhea. Negative for ear pain and sore throat.   Eyes: Negative for pain and visual disturbance.  Respiratory: Positive for cough. Negative for shortness of breath.   Cardiovascular: Negative for chest pain and palpitations.  Gastrointestinal: Negative for abdominal pain and vomiting.  Genitourinary: Negative for dysuria and hematuria.  Musculoskeletal: Negative for arthralgias and back pain.  Skin: Negative for color change and rash.  Neurological: Positive for dizziness and headaches. Negative for seizures and syncope.  Psychiatric/Behavioral: Positive for sleep disturbance.  All other systems reviewed and are negative.    Physical Exam Triage Vital Signs ED Triage Vitals  Enc Vitals Group     BP 02/23/18 1102 (!) 143/78     Pulse Rate 02/23/18 1102 87     Resp 02/23/18 1102 18     Temp 02/23/18 1102 98.5 F (36.9 C)  Temp src --      SpO2 02/23/18 1102 98 %     Weight --      Height --      Head Circumference --      Peak Flow --      Pain Score 02/23/18 1233 0     Pain Loc --      Pain Edu? --      Excl. in [place]? --    No data found.  Updated Vital Signs BP (!) 143/78   Pulse 87   Temp 98.5 F (36.9 C)   Resp 18   SpO2 98%   Visual Acuity Right Eye Distance:   Left Eye Distance:   Bilateral Distance:    Right Eye Near:   Left Eye Near:    Bilateral Near:     Physical Exam  Constitutional: He is oriented to person, place, and time. He appears well-developed and well-nourished.  HENT:  Head: Normocephalic and atraumatic.  Right Ear: External ear normal.  Left Ear: External ear normal.  Nose: Nose normal.  Mouth/Throat: Oropharynx is clear and moist.  Nose congested, clear mucus.   edentulous.  No sinus tenderness.  Eyes: Pupils are equal, round, and reactive to light. Conjunctivae are normal.  Neck: Normal range of motion. Neck supple.  Cardiovascular: Normal rate and regular rhythm.  Murmur heard. Soft systolic murmur  Pulmonary/Chest: [place] normal and breath sounds normal. No respiratory distress.  Lungs are clear  Abdominal: Soft. There is no tenderness.  Obese abdomen  Musculoskeletal: He exhibits edema.  Chronic significant pedal edema with stasis changes  Lymphadenopathy:    He has no cervical adenopathy.  Neurological: He is alert and oriented to person, place, and time.  Skin: Skin is warm and dry.  Psychiatric: He has a normal mood and affect.  Nursing note and vitals reviewed.    UC Treatments / Results    Initial Impression / Assessment and Plan / UC Course  I have reviewed the triage vital signs and the nursing notes.  Pertinent labs & imaging results that were available during my care of the patient were reviewed by me and considered in my medical decision making (see chart for details).     Discussed that this is possibly influenza.  Likely a viral  illness.  Because he feels like his flu, and has significant underlying illness I will treat him with Tamiflu.  Symptoms for slightly less than 48 hours.  Discussed  symptomatic care. Final Clinical Impressions(s) / UC Diagnoses   Final diagnoses:  Influenza-like illness     Discharge Instructions     Make sure you drink plenty of fluids Take the Tamiflu twice a day for 5 days Take cetirizine (generic Zyrtec) daily to help with runny nose Take Tessalon twice a day for cough until symptoms resolve Follow-up with your PCP if not improving by Monday   ED Prescriptions    Medication Sig Dispense Auth. Provider   oseltamivir (TAMIFLU) 75 MG capsule Take 1 capsule (75 mg total) by mouth every 12 (twelve) hours. 10 capsule Raylene Everts, MD   benzonatate (TESSALON) 200 MG capsule Take 1 capsule (200 mg total) by mouth 2 (two) times daily as needed for cough. 20 capsule Raylene Everts, MD   cetirizine (ZYRTEC) 10 MG tablet Take 1 tablet (10 mg total) by mouth daily. 14 tablet Raylene Everts, MD     Controlled Substance Prescriptions [place] Controlled Substance Registry consulted? Not Applicable   Meda Coffee,  Jennette Banker, MD 02/23/18 1253

## 2018-02-23 NOTE — ED Triage Notes (Signed)
Pt c/o chest congestion, cold sweats, bad mucous, coughing.

## 2018-03-06 ENCOUNTER — Telehealth: Payer: Self-pay | Admitting: Cardiology

## 2018-03-06 NOTE — Telephone Encounter (Signed)
[facility] nurse called in on behalf of NP  Spoke with nurse at this practice. NP wanted Dr. Stanford Breed to be aware that patient is on carvedilol 25mg  BID and propranolol 80mg  for headaches Rx'ed by Dr. Krista Blue (neurologist)  BP & HR were "good" during his office visit yesterday - 129/72 HR 75  Will route to Dr. Stanford Breed & Hilda Blades RN as Juluis Rainier

## 2018-03-08 ENCOUNTER — Telehealth: Payer: Self-pay | Admitting: Neurology

## 2018-03-08 ENCOUNTER — Other Ambulatory Visit: Payer: Self-pay

## 2018-03-08 DIAGNOSIS — I83893 Varicose veins of bilateral lower extremities with other complications: Secondary | ICD-10-CM

## 2018-03-08 DIAGNOSIS — I82401 Acute embolism and thrombosis of unspecified deep veins of right lower extremity: Secondary | ICD-10-CM

## 2018-03-08 MED ORDER — DULOXETINE HCL 60 MG PO CPEP: 60.0000 mg | ORAL_CAPSULE | Freq: Every day | ORAL | 8 refills | Status: DC

## 2018-03-08 NOTE — Telephone Encounter (Signed)
PT stated he would like a refill sent to [facility] [address]. Pt change pharmacy. PT wanted his inderal, and [facility]ymbalta [facility]. Rn stated the Cymbalta has been sent. Rn stated a message will be sent to Dr. Krista Blue about the inderal. Pt stated he had one pill left of inderal. PT verbalized understanding.

## 2018-03-08 NOTE — Telephone Encounter (Signed)
Patient in lobby stating he needs Rx to send [facility]

## 2018-03-08 NOTE — Telephone Encounter (Signed)
Patient presented to lobby stating he needs to know the medications he takes as he switched pharmacies. [facility] [address] He stated the pharmacy told him to get new Rx to bring in. Best call back [phone]

## 2018-03-09 NOTE — Telephone Encounter (Signed)
Reviewed his comprehensive medication list, he is on Coreg 25 mg twice a day, but there was no Inderal listed on his medication,  He may contact prescriber to clarify the issue about Inderal

## 2018-03-13 NOTE — Telephone Encounter (Signed)
Called, LVM for pt letting him know per Dr. Krista Blue, he will need to contact PCP about refill on Inderal. Gave [place] phone number if he has further questions/concerns.

## 2018-03-21 ENCOUNTER — Ambulatory Visit: Payer: Medicaid Other

## 2018-03-22 ENCOUNTER — Ambulatory Visit (INDEPENDENT_AMBULATORY_CARE_PROVIDER_SITE_OTHER): Payer: Medicaid Other | Admitting: *Deleted

## 2018-03-22 DIAGNOSIS — E538 Deficiency of other specified B group vitamins: Secondary | ICD-10-CM | POA: Diagnosis not present

## 2018-03-22 MED ORDER — CYANOCOBALAMIN 1000 MCG/ML IJ SOLN
1000.0000 ug | Freq: Once | INTRAMUSCULAR | Status: AC
  Administered 2018-03-22: 1000 ug via INTRAMUSCULAR

## 2018-03-22 NOTE — Progress Notes (Signed)
Gave Vitamin B12 1058mcg/ml in Left deltoid. Cleaned with alcohol wipe prior to injection.  Pt tolerated well.

## 2018-03-24 ENCOUNTER — Other Ambulatory Visit: Payer: Self-pay | Admitting: Physician Assistant

## 2018-03-25 ENCOUNTER — Emergency Department (HOSPITAL_COMMUNITY): Payer: Medicaid Other

## 2018-03-25 ENCOUNTER — Encounter (HOSPITAL_COMMUNITY): Payer: Self-pay | Admitting: Emergency Medicine

## 2018-03-25 ENCOUNTER — Other Ambulatory Visit: Payer: Self-pay

## 2018-03-25 ENCOUNTER — Emergency Department (HOSPITAL_COMMUNITY)
Admission: EM | Admit: 2018-03-25 | Discharge: 2018-03-25 | Disposition: A | Discharge status: Home / Self Care | Payer: Medicaid Other | Attending: Emergency Medicine | Admitting: Emergency Medicine

## 2018-03-25 DIAGNOSIS — Z7982 Long term (current) use of aspirin: Secondary | ICD-10-CM | POA: Diagnosis not present

## 2018-03-25 DIAGNOSIS — E119 Type 2 diabetes mellitus without complications: Secondary | ICD-10-CM | POA: Diagnosis not present

## 2018-03-25 DIAGNOSIS — Z79899 Other long term (current) drug therapy: Secondary | ICD-10-CM | POA: Diagnosis not present

## 2018-03-25 DIAGNOSIS — Z794 Long term (current) use of insulin: Secondary | ICD-10-CM | POA: Diagnosis not present

## 2018-03-25 DIAGNOSIS — I2510 Atherosclerotic heart disease of native coronary artery without angina pectoris: Secondary | ICD-10-CM | POA: Diagnosis not present

## 2018-03-25 DIAGNOSIS — R2243 Localized swelling, mass and lump, lower limb, bilateral: Secondary | ICD-10-CM | POA: Diagnosis present

## 2018-03-25 DIAGNOSIS — R609 Edema, unspecified: Secondary | ICD-10-CM | POA: Diagnosis not present

## 2018-03-25 DIAGNOSIS — I10 Essential (primary) hypertension: Secondary | ICD-10-CM | POA: Insufficient documentation

## 2018-03-25 LAB — CBC
HCT: 34.9 % — ABNORMAL LOW (ref 39.0–52.0)
Hemoglobin: 11 g/dL — ABNORMAL LOW (ref 13.0–17.0)
MCH: 29 pg (ref 26.0–34.0)
MCHC: 31.5 g/dL (ref 30.0–36.0)
MCV: 92.1 fL (ref 78.0–100.0)
Platelets: 228 10*3/uL (ref 150–400)
RBC: 3.79 MIL/uL — ABNORMAL LOW (ref 4.22–5.81)
RDW: 14.5 % (ref 11.5–15.5)
WBC: 9 10*3/uL (ref 4.0–10.5)

## 2018-03-25 LAB — BASIC METABOLIC PANEL
Anion gap: 13 (ref 5–15)
BUN: 8 mg/dL (ref 6–20)
CO2: 26 mmol/L (ref 22–32)
Calcium: 8.6 mg/dL — ABNORMAL LOW (ref 8.9–10.3)
Chloride: 96 mmol/L — ABNORMAL LOW (ref 101–111)
Creatinine, Ser: 0.85 mg/dL (ref 0.61–1.24)
GFR calc Af Amer: >60 mL/min (ref >60)
GFR calc non Af Amer: >60 mL/min (ref >60)
Glucose, Bld: 246 mg/dL — ABNORMAL HIGH (ref 65–99)
Potassium: 2.7 mmol/L — CL (ref 3.5–5.1)
Sodium: 135 mmol/L (ref 135–145)

## 2018-03-25 LAB — I-STAT TROPONIN, ED: Troponin i, poc: 0 ng/mL (ref 0.00–0.08)

## 2018-03-25 MED ORDER — FUROSEMIDE 20 MG PO TABS
40.0000 mg | ORAL_TABLET | Freq: Once | ORAL | Status: AC
  Administered 2018-03-25: 40 mg via ORAL
  Filled 2018-03-25: qty 2

## 2018-03-25 MED ORDER — OXYCODONE-ACETAMINOPHEN 5-325 MG PO TABS
1.0000 | ORAL_TABLET | Freq: Once | ORAL | Status: AC
  Administered 2018-03-25: 1 via ORAL
  Filled 2018-03-25 (×2): qty 1

## 2018-03-25 MED ORDER — POTASSIUM CHLORIDE CRYS ER 20 MEQ PO TBCR
40.0000 meq | EXTENDED_RELEASE_TABLET | Freq: Once | ORAL | Status: AC
  Administered 2018-03-25: 40 meq via ORAL
  Filled 2018-03-25: qty 2

## 2018-03-25 NOTE — ED Triage Notes (Addendum)
Pt presents with bilat lower extremity swelling and pain, open wound noted to bilat lower extremities.  Pt states pain and swelling is normal but blisters opening is not normal for him Pt reports upper chest discomfort radiating to left at times onset this afternoon. Minor shob

## 2018-03-25 NOTE — Discharge Instructions (Signed)
Take an extra 40 mg of Lasix daily, for the next 3 to 4 days to help with your leg swelling.  Double the amount of potassium you are taking daily, take 80 mEq to prevent your potassium from going too low while you increase your Lasix. Wear the compression stockings at all times, and elevate your legs as much as possible. Keep your wounds clean. Schedule an appointment with your primary care provider within the next few days to follow-up.

## 2018-03-25 NOTE — ED Provider Notes (Signed)
[facility] EMERGENCY DEPARTMENT Provider Note   CSN: [id number] Arrival date & time: 03/25/18  1945     History   Chief Complaint Chief Complaint  Patient presents with  . Leg Swelling    HPI [name] is a 57 y.o. male with past medical history of hypertension, hyperlipidemia, CHF, CAD, type 2 diabetes, hypokalemia, presenting to the ED with worsening swelling to bilateral lower extremities.  He states today his legs began to blister with associated burning pain.  Per triage note, patient reported chest discomfort and shortness of breath, though repeatedly states those symptoms are not new nor are they worsening. States he is here for evaluation of his legs. Reports he takes his lasix daily as prescribed, as well as potassium supplement. States he stopped wearing his compression stockings 2 days ago due to discomfort.  He does not elevate his legs very often, only at bedtime.  He takes oxycodone for pain, however ran out today and does not have appointment with pain clinic until the 20th of this month. Denies fever, purulent drainage, or any other complaints.  The history is provided by the patient.    Past Medical History:  Diagnosis Date  . Arthritis   . Back pain   . CAD (coronary artery disease)    a. s/p DES to LAD in 05/2016  . Cervical radiculopathy   . Chronic diastolic CHF (congestive heart failure) ([place])   . Chronic pain   . Depression   . DVT (deep venous thrombosis) ([place])   . Hematemesis   . Hepatic steatosis   . Hyperlipidemia   . Hypertension   . IBS (irritable bowel syndrome)   . Morbid obesity ([place])   . OSA (obstructive sleep apnea)   . Pancreatitis   . PE (pulmonary thromboembolism) ([place])   . Peripheral neuropathy   . PUD (peptic ulcer disease)   . Renal disorder   . Stroke [facility])    a. ?details unclear - not seen on imaging when he was admitted in 05/2017 for TIA symptoms which were felt due to cervical radiculopathy.  .  Thoracic aortic ectasia (H[facility])    a. 4.3cm ectatic ascending thoracic aorta by CT 06/2017.   . Type 2 diabetes mellitus [facility])     Patient Active Problem List   Diagnosis Date Noted  . B12 deficiency 08/10/2017  . Persistent headaches 08/09/2017  . GERD (gastroesophageal reflux disease) 06/29/2017  . Left arm numbness   . Cerebral embolism with cerebral infarction 06/17/2017  . TIA (transient ischemic attack) 06/17/2017  . Chronic back pain 12/29/2016  . Depression 12/15/2016  . Vitamin D deficiency 12/09/2016  . Right hip pain 12/07/2016  . Hypokalemia 12/07/2016  . Type 2 diabetes mellitus with vascular disease ([place]) 05/31/2016  . Normocytic normochromic anemia 05/31/2016  . Chest pain 05/31/2016  . CAD (coronary artery disease) 01/06/2016  . DVT (deep venous thrombosis) ([place]) 01/06/2016  . Lactic acidosis 05/28/2014  . Nonspecific chest pain 01/29/2014  . Uncontrolled secondary diabetes with peripheral neuropathy ([place]) 01/29/2014  . Obesity, Class III, BMI 40-49.9 (morbid obesity) ([place]) 01/29/2014  . Snoring 01/29/2014  . Dyslipidemia 01/29/2014  . HTN (hypertension) 01/29/2014  . Abnormal nuclear stress test 01/29/2014    Past Surgical History:  Procedure Laterality Date  . CARDIAC CATHETERIZATION N/A 05/31/2016   Procedure: Left Heart Cath and Coronary Angiography;  Surgeon: Peter M [place]ique, MD;  Location: [facility] [facility] CV LAB;  Service: Cardiovascular;  Laterality: N/A;  . CARDIAC CATHETERIZATION  N/A 05/31/2016   Procedure: Intravascular Pressure Wire/FFR Study;  Surgeon: Peter M [place], MD;  Location: [place] CV LAB;  Service: Cardiovascular;  Laterality: N/A;  . CARDIAC CATHETERIZATION N/A 05/31/2016   Procedure: Coronary Stent Intervention;  Surgeon: Peter M [place], MD;  Location: [place] CV LAB;  Service: Cardiovascular;  Laterality: N/A;  . LEFT HEART CATH AND CORONARY ANGIOGRAPHY N/A 12/08/2017   Procedure: LEFT HEART CATH AND CORONARY ANGIOGRAPHY;  Surgeon: Leonie Man, MD;  Location: [place] CV LAB;  Service: Cardiovascular;  Laterality: N/A;  . LEFT HEART CATHETERIZATION WITH CORONARY ANGIOGRAM N/A 02/03/2014   Procedure: LEFT HEART CATHETERIZATION WITH CORONARY ANGIOGRAM;  Surgeon: Pixie Casino, MD;  Location[facility] CATH LAB;  Service: Cardiovascular;  Laterality: N/A;  . left leg stent           Home Medications    Prior to Admission medications   Medication Sig Start Date End Date Taking? Authorizing Provider  aspirin 81 MG chewable tablet Chew 1 tablet (81 mg total) by mouth daily. 06/01/16   Elwin Mocha, MD  atorvastatin (LIPITOR) 80 MG tablet Take 1 tablet (80 mg total) by mouth daily. 01/26/17   Strader, Fransisco Hertz, PA-C  benzonatate (TESSALON) 200 MG capsule Take 1 capsule (200 mg total) by mouth 2 (two) times daily as needed for cough. 02/23/18   Raylene Everts, MD  carvedilol (COREG) 25 MG tablet Take 1 tablet (25 mg total) by mouth 2 (two) times daily with a meal. 01/26/17   Strader, [place] M, PA-C  cetirizine (ZYRTEC) 10 MG tablet Take 1 tablet (10 mg total) by mouth daily. 02/23/18   Raylene Everts, MD  DULoxetine (CYMBALTA) 60 MG capsule Take 1 capsule (60 mg total) by mouth daily. 03/08/18   Marcial Pacas, MD  famotidine (PEPCID) 40 MG tablet Take 1 tablet (40 mg total) by mouth at bedtime. Patient taking differently: Take 40 mg by mouth daily.  06/18/17   Barton Dubois, MD  furosemide (LASIX) 40 MG tablet Take 1 tablet (40 mg total) by mouth every evening. 12/09/17   Velvet Bathe, MD  furosemide (LASIX) 80 MG tablet Take 1 tablet (80 mg total) by mouth daily. 12/10/17   Velvet Bathe, MD  glipiZIDE (GLUCOTROL) 10 MG tablet Take 10 mg by mouth 2 (two) times daily. 05/30/17   [provider]  hydrALAZINE (APRESOLINE) 25 MG tablet Take 3 tablets (75 mg total) by mouth 3 (three) times daily. 01/30/18   Barrett, Evelene Croon, PA-C  Insulin Detemir (LEVEMIR) 100 UNIT/ML Pen Inject 20 Units into the skin daily at 10 pm. Patient  taking differently: Inject 55 Units into the skin daily at 10 pm.  06/18/17   Barton Dubois, MD  Insulin Pen Needle 31G X 5 MM MISC Use 1 needle daily to inject insulin as prescribed 06/18/17   Barton Dubois, MD  isosorbide dinitrate (ISORDIL) 30 MG tablet Take 1 tablet (30 mg total) by mouth every morning. 12/10/17   Velvet Bathe, MD  lisinopril (PRINIVIL,ZESTRIL) 10 MG tablet Take 1 tablet (10 mg total) by mouth daily. 12/10/17   Velvet Bathe, MD  LYRICA 100 MG capsule Take 100 mg by mouth 2 (two) times daily. 11/29/17   [provider]  metFORMIN (GLUCOPHAGE-XR) 500 MG 24 hr tablet Take 2 tablets (1,000 mg total) by mouth 2 (two) times daily. 07/02/17   Barton Dubois, MD  nitroGLYCERIN (NITROSTAT) 0.4 MG SL tablet Place 0.4 mg under the tongue every 5 (five) minutes  as needed for chest pain. 12/17/16   [provider]  ondansetron (ZOFRAN ODT) 4 MG disintegrating tablet Take 1 tablet (4 mg total) by mouth every 8 (eight) hours as needed for nausea or vomiting. 06/09/17   Varney Biles, MD  ondansetron (ZOFRAN) 4 MG tablet Take 1 tablet (4 mg total) every 8 (eight) hours as needed by mouth for nausea or vomiting. 09/02/17   [name], PA-C  oseltamivir (TAMIFLU) 75 MG capsule Take 1 capsule (75 mg total) by mouth every 12 (twelve) hours. 02/23/18   Raylene Everts, MD  oxyCODONE (ROXICODONE) 15 MG immediate release tablet Take 15 mg 3 (three) times daily as needed by mouth for pain.  08/28/17   [provider]  pantoprazole (PROTONIX) 40 MG tablet Take 1 tablet (40 mg total) by mouth 2 (two) times daily. 06/18/17   Barton Dubois, MD  potassium chloride SA (K-DUR,KLOR-CON) 20 MEQ tablet Take 40 mEq by mouth daily.     [provider]  propranolol (INDERAL) 80 MG tablet Take 80 mg by mouth.    [provider]  sertraline (ZOLOFT) 50 MG tablet Take 50 mg by mouth daily.    [provider]  sitaGLIPtin (JANUVIA) 100 MG tablet Take 100 mg by mouth  daily.    [provider]  terbinafine (LAMISIL) 250 MG tablet Take 250 mg by mouth daily. 12/07/17   [provider]  tiZANidine (ZANAFLEX) 2 MG tablet Take 2 mg by mouth every 6 (six) hours as needed for muscle spasms.    [provider]  traZODone (DESYREL) 50 MG tablet Take 1 tablet (50 mg total) by mouth at bedtime. 12/15/16   Florinda Marker, MD  Vitamin D, Ergocalciferol, (DRISDOL) 50000 units CAPS capsule Take 1 capsule by mouth every Monday. 11/06/17   [provider]    Family History Family History  Problem Relation Age of Onset  . Cancer Father   . Hypertension Mother   . Diabetes Mother   . Breast cancer Mother   . Hypertension Brother   . Diabetes Brother   . Hypertension Sister   . Diabetes Sister     Social History Social History   Tobacco Use  . Smoking status: Never Smoker  . Smokeless tobacco: Never Used  Substance Use Topics  . Alcohol use: No  . Drug use: No     Allergies   Coconut flavor [flavoring agent]; Coconut oil; Ibuprofen; Aleve [naproxen]; and Nsaids   Review of Systems Review of Systems  Constitutional: Negative for fever.  Respiratory: Positive for shortness of breath (chronic, exertional, unchanged).   Cardiovascular: Positive for chest pain (chronic, unchanged) and leg swelling.  Skin: Positive for wound.  All other systems reviewed and are negative.    Physical Exam Updated Vital Signs BP 138/72 (BP Location: Right Arm)   Pulse 69   Temp 98.1 F (36.7 C) (Oral)   Resp 15   Ht 5\' 10"  (1.778 m)   Wt (!) 140.6 kg (310 lb)   SpO2 98%   BMI 44.48 kg/m   Physical Exam  Constitutional: He appears well-developed and well-nourished. No distress.  HENT:  Head: Normocephalic and atraumatic.  Eyes: Conjunctivae are normal.  Cardiovascular: Normal rate, regular rhythm and intact distal pulses.  Pulmonary/Chest: Effort normal and breath sounds normal. No stridor. No respiratory distress. He has no  wheezes. He has no rales.  Abdominal: Soft.  Musculoskeletal:  Bilateral lower extremity edema with venous stasis changes.  There are superficial  weeping wounds to bilateral anterior lower legs, no purulence, no erythema.  Mild warmth.   Neurological: He is alert.  Skin: Skin is warm.  Psychiatric: He has a normal mood and affect. His behavior is normal.  Nursing note and vitals reviewed.    ED Treatments / Results  Labs (all labs ordered are listed, but only abnormal results are displayed) Labs Reviewed  BASIC METABOLIC PANEL - Abnormal; Notable for the following components:      Result Value   Potassium 2.7 (*)    Chloride 96 (*)    Glucose, Bld 246 (*)    Calcium 8.6 (*)    All other components within normal limits  CBC - Abnormal; Notable for the following components:   RBC 3.79 (*)    Hemoglobin 11.0 (*)    HCT 34.9 (*)    All other components within normal limits  I-STAT TROPONIN, ED    EKG EKG Interpretation  Date/Time:  Sunday March 25 2018 20:44:25 EDT Ventricular Rate:  81 PR Interval:    QRS Duration: 105 QT Interval:  404 QTC Calculation: 469 R Axis:   48 Text Interpretation:  Sinus rhythm Borderline T abnormalities, diffuse leads No significant change since last tracing Confirmed by Blanchie Dessert [phone]) on 03/25/2018 8:54:22 PM   Radiology Dg Chest 2 View  Result Date: 03/25/2018 CLINICAL DATA:  Chest pain EXAM: CHEST - 2 VIEW COMPARISON:  12/27/2017 FINDINGS: The heart size and mediastinal contours are within normal limits. Both lungs are clear. The visualized skeletal structures are unremarkable. IMPRESSION: No active cardiopulmonary disease. Electronically Signed   By: Inez Catalina M.D.   On: 03/25/2018 20:51    Procedures Procedures (including critical care time)  Medications Ordered in ED Medications  potassium chloride SA (K-DUR,KLOR-CON) CR tablet 40 mEq (40 mEq Oral Given 03/25/18 2153)  furosemide (LASIX) tablet 40 mg (40 mg Oral Given  03/25/18 2153)  oxyCODONE-acetaminophen (PERCOCET/ROXICET) 5-325 MG per tablet 1 tablet (1 tablet Oral Given 03/25/18 2154)     Initial Impression / Assessment and Plan / ED Course  I have reviewed the triage vital signs and the nursing notes.  Pertinent labs & imaging results that were available during my care of the patient were reviewed by me and considered in my medical decision making (see chart for details).     Patient with peripheral edema to bilateral lower extremities, superficial weeping wounds.  No evidence of infection.  Denies new shortness of breath or chest pains.  Afebrile, not in distress.  Labs with hypokalemia though not significantly worse from baseline.  No EKG changes.  Chest x-ray negative. VSS.  Patient does take daily supplement of potassium as well as Lasix.  Patient care plan discussed with Dr. Maryan Rued.  Will provide dose of p.o. potassium in the ED as well as dose of Lasix.  Recommend patient take increased dose of Lasix and double his potassium supplement intake for the next 3 to 4 days to help with swelling.  Encouraged compression stockings, elevation, low-salt diet.  PCP follow-up for potassium recheck.  Wound care instructions and wound clinic referral provided as needed.  Return precautions discussed.  Safe for discharge.  Discussed results, findings, treatment and follow up. Patient advised of return precautions. Patient verbalized understanding and agreed with plan.]  Final Clinical Impressions(s) / ED Diagnoses   Final diagnoses:  Peripheral edema    ED Discharge Orders    None       Saylee Sherrill, [place] N, [place] 03/25/18 2242  Blanchie Dessert, MD 03/26/18 2105

## 2018-03-27 NOTE — Telephone Encounter (Signed)
Rx sent to pharmacy   

## 2018-03-28 ENCOUNTER — Telehealth: Payer: Self-pay | Admitting: Cardiology

## 2018-03-28 NOTE — Telephone Encounter (Signed)
Follow up   [name], states she called last month about the pt being on 2 beta blockers Carvedilol and propanolol and never heard anything back. Please call  See not 5/14

## 2018-03-28 NOTE — Telephone Encounter (Signed)
Left message for [name], the patient is on propranolol for headaches. As long as his heart rate and blood pressure are ok and the patient is not having problems, we are not making any changes in his medications.

## 2018-04-01 ENCOUNTER — Emergency Department (HOSPITAL_COMMUNITY)
Admission: EM | Admit: 2018-04-01 | Discharge: 2018-04-01 | Disposition: A | Discharge status: Home / Self Care | Payer: Medicaid Other | Attending: Emergency Medicine | Admitting: Emergency Medicine

## 2018-04-01 ENCOUNTER — Encounter (HOSPITAL_COMMUNITY): Payer: Self-pay | Admitting: Emergency Medicine

## 2018-04-01 DIAGNOSIS — L909 Atrophic disorder of skin, unspecified: Secondary | ICD-10-CM | POA: Insufficient documentation

## 2018-04-01 DIAGNOSIS — Z79899 Other long term (current) drug therapy: Secondary | ICD-10-CM | POA: Diagnosis not present

## 2018-04-01 DIAGNOSIS — R079 Chest pain, unspecified: Secondary | ICD-10-CM | POA: Diagnosis not present

## 2018-04-01 DIAGNOSIS — Z7982 Long term (current) use of aspirin: Secondary | ICD-10-CM | POA: Insufficient documentation

## 2018-04-01 DIAGNOSIS — Z5189 Encounter for other specified aftercare: Secondary | ICD-10-CM

## 2018-04-01 DIAGNOSIS — Z8673 Personal history of transient ischemic attack (TIA), and cerebral infarction without residual deficits: Secondary | ICD-10-CM | POA: Diagnosis not present

## 2018-04-01 DIAGNOSIS — E1140 Type 2 diabetes mellitus with diabetic neuropathy, unspecified: Secondary | ICD-10-CM | POA: Diagnosis not present

## 2018-04-01 DIAGNOSIS — Z794 Long term (current) use of insulin: Secondary | ICD-10-CM | POA: Diagnosis not present

## 2018-04-01 DIAGNOSIS — R2243 Localized swelling, mass and lump, lower limb, bilateral: Secondary | ICD-10-CM | POA: Diagnosis not present

## 2018-04-01 DIAGNOSIS — I110 Hypertensive heart disease with heart failure: Secondary | ICD-10-CM | POA: Insufficient documentation

## 2018-04-01 DIAGNOSIS — I2510 Atherosclerotic heart disease of native coronary artery without angina pectoris: Secondary | ICD-10-CM | POA: Diagnosis not present

## 2018-04-01 DIAGNOSIS — I5032 Chronic diastolic (congestive) heart failure: Secondary | ICD-10-CM | POA: Diagnosis not present

## 2018-04-01 DIAGNOSIS — R238 Other skin changes: Secondary | ICD-10-CM

## 2018-04-01 LAB — CBC WITH DIFFERENTIAL/PLATELET
Abs Immature Granulocytes: 0 10*3/uL (ref 0.0–0.1)
Basophils Absolute: 0 10*3/uL (ref 0.0–0.1)
Basophils Relative: 0 %
Eosinophils Absolute: 0.1 10*3/uL (ref 0.0–0.7)
Eosinophils Relative: 1 %
HCT: 36.1 % — ABNORMAL LOW (ref 39.0–52.0)
Hemoglobin: 11.3 g/dL — ABNORMAL LOW (ref 13.0–17.0)
Immature Granulocytes: 1 %
Lymphocytes Relative: 23 %
Lymphs Abs: 1.9 10*3/uL (ref 0.7–4.0)
MCH: 29 pg (ref 26.0–34.0)
MCHC: 31.3 g/dL (ref 30.0–36.0)
MCV: 92.6 fL (ref 78.0–100.0)
Monocytes Absolute: 0.7 10*3/uL (ref 0.1–1.0)
Monocytes Relative: 8 %
Neutro Abs: 5.5 10*3/uL (ref 1.7–7.7)
Neutrophils Relative %: 67 %
Platelets: 252 10*3/uL (ref 150–400)
RBC: 3.9 MIL/uL — ABNORMAL LOW (ref 4.22–5.81)
RDW: 14.7 % (ref 11.5–15.5)
WBC: 8.2 10*3/uL (ref 4.0–10.5)

## 2018-04-01 LAB — COMPREHENSIVE METABOLIC PANEL
ALT: 19 U/L (ref 17–63)
AST: 20 U/L (ref 15–41)
Albumin: 3.5 g/dL (ref 3.5–5.0)
Alkaline Phosphatase: 37 U/L — ABNORMAL LOW (ref 38–126)
Anion gap: 13 (ref 5–15)
BUN: 9 mg/dL (ref 6–20)
CO2: 28 mmol/L (ref 22–32)
Calcium: 8.9 mg/dL (ref 8.9–10.3)
Chloride: 101 mmol/L (ref 101–111)
Creatinine, Ser: 0.71 mg/dL (ref 0.61–1.24)
GFR calc Af Amer: >60 mL/min (ref >60)
GFR calc non Af Amer: >60 mL/min (ref >60)
Glucose, Bld: 229 mg/dL — ABNORMAL HIGH (ref 65–99)
Potassium: 3.2 mmol/L — ABNORMAL LOW (ref 3.5–5.1)
Sodium: 142 mmol/L (ref 135–145)
Total Bilirubin: 0.5 mg/dL (ref 0.3–1.2)
Total Protein: 6.7 g/dL (ref 6.5–8.1)

## 2018-04-01 LAB — I-STAT CG4 LACTIC ACID, ED
Lactic Acid, Venous: 4.28 mmol/L (ref 0.5–1.9)
Lactic Acid, Venous: 4.01 mmol/L (ref 0.5–1.9)

## 2018-04-01 LAB — I-STAT TROPONIN, ED: Troponin i, poc: 0.01 ng/mL (ref 0.00–0.08)

## 2018-04-01 MED ORDER — MORPHINE SULFATE (PF) 4 MG/ML IV SOLN
4.0000 mg | Freq: Once | INTRAVENOUS | Status: AC
  Administered 2018-04-01: 4 mg via INTRAVENOUS
  Filled 2018-04-01: qty 1

## 2018-04-01 MED ORDER — DOXYCYCLINE HYCLATE 100 MG PO CAPS: 100.0000 mg | ORAL_CAPSULE | Freq: Two times a day (BID) | ORAL | 0 refills | Status: DC

## 2018-04-01 MED ORDER — SODIUM CHLORIDE 0.9 % IV BOLUS
1000.0000 mL | Freq: Once | INTRAVENOUS | Status: AC
  Administered 2018-04-01: 1000 mL via INTRAVENOUS

## 2018-04-01 MED ORDER — MUPIROCIN CALCIUM 2 % EX CREA
TOPICAL_CREAM | Freq: Once | CUTANEOUS | Status: AC
  Administered 2018-04-01: 16:00:00 via TOPICAL
  Filled 2018-04-01: qty 15

## 2018-04-01 MED ORDER — POTASSIUM CHLORIDE CRYS ER 20 MEQ PO TBCR
40.0000 meq | EXTENDED_RELEASE_TABLET | Freq: Once | ORAL | Status: AC
  Administered 2018-04-01: 40 meq via ORAL
  Filled 2018-04-01: qty 2

## 2018-04-01 MED ORDER — ONDANSETRON HCL 4 MG/2ML IJ SOLN
4.0000 mg | Freq: Once | INTRAMUSCULAR | Status: AC
  Administered 2018-04-01: 4 mg via INTRAVENOUS
  Filled 2018-04-01: qty 2

## 2018-04-01 NOTE — ED Notes (Signed)
Pt stable, ambulatory, and verbalizes understanding of d/c instructions.  

## 2018-04-01 NOTE — ED Triage Notes (Signed)
Pt states he needs a wound check and dressing change to bilateral ankles, diabetic wounds. Pt has an appointment Tuesday to establish his care at the wound center in [place]. Pt reports increase drainage to left wound.

## 2018-04-01 NOTE — Discharge Instructions (Addendum)
Please change the dressings over your shins daily as I showed you.  Take antibiotic twice a day for the next week.  Follow-up at your scheduled appointment with the wound clinic on Tuesday 6/11.  Return to the emergency department if you have any new or concerning symptoms like fever, worsening redness and swelling over the legs or vomiting.

## 2018-04-01 NOTE — ED Provider Notes (Addendum)
With superficial open wounds clean appearing along bilateral shins.  No inguinal adenopathy.  No red streaks proximally.  DP pulses 2+ bilaterally.  Elevated lactic acid felt to be secondary to metformin.  Patient certainly exhibiting no signs of sepsis   Orlie Dakin, MD 04/01/18 1548 ED ECG REPORT   Date: 04/01/2018  Rate: 75  Rhythm: normal sinus rhythm  QRS Axis: normal  Intervals: normal  ST/T Wave abnormalities: nonspecific T wave changes  Conduction Disutrbances:none  Narrative Interpretation:   Old EKG Reviewed: unchanged  I have personally reviewed the EKG tracing and agree with the computerized printout as noted.   Orlie Dakin, MD 04/01/18 1730

## 2018-04-01 NOTE — ED Provider Notes (Signed)
[facility] EMERGENCY DEPARTMENT Provider Note   CSN: [id number] Arrival date & time: 04/01/18  1122     History   Chief Complaint Chief Complaint  Patient presents with  . Wound Check    HPI [name] is a 57 y.o. male.  HPI  Patient is a 57yo male with a history of CHF (EF 50 to 55%), CAD, HTN, HLD, obesity, OSA, who presents to the emergency department for evaluation of bilateral venous stasis ulcers.  Patient reports that he has had bilateral lower extremity swelling and associated wounds for the past week now.  He was seen in the ED 6/2 for this.  States that he also saw his PCP last week for wound check and has a follow-up appointment with wound care in two days.  States that he has the lower legs wrapped by home nursing staff every few days.  He was concerned because yesterday he noticed some leaking from the bandage on his left leg.  He reports burning sensation over bilateral legs which is 9/10 in severity.  Takes oxycodone at home for pain, but it has not been providing relief.  Denies fevers, chills, abdominal pain, nausea/vomiting, diarrhea, shortness of breath, numbness, weakness, lightheadedness, syncope.  He reports that overnight he felt sharp left-sided chest pain which radiated down his left arm, but that went away around 7 AM today and has not returned.  He denies associated diaphoresis, shortness of breath, nausea/vomiting or lightheadedness at the time.    Past Medical History:  Diagnosis Date  . Arthritis   . Back pain   . CAD (coronary artery disease)    a. s/p DES to LAD in 05/2016  . Cervical radiculopathy   . Chronic diastolic CHF (congestive heart failure) ([place])   . Chronic pain   . Depression   . DVT (deep venous thrombosis) ([place])   . Hematemesis   . Hepatic steatosis   . Hyperlipidemia   . Hypertension   . IBS (irritable bowel syndrome)   . Morbid obesity ([place])   . OSA (obstructive sleep apnea)   . Pancreatitis   . PE  (pulmonary thromboembolism) ([place])   . Peripheral neuropathy   . PUD (peptic ulcer disease)   . Renal disorder   . Stroke [facility])    a. ?details unclear - not seen on imaging when he was admitted in 05/2017 for TIA symptoms which were felt due to cervical radiculopathy.  . Thoracic aortic ectasia (HCC)    a. 4.3cm ectatic ascending thoracic aorta by CT 06/2017.   . Type 2 diabetes mellitus [facility])     Patient Active Problem List   Diagnosis Date Noted  . B12 deficiency 08/10/2017  . Persistent headaches 08/09/2017  . GERD (gastroesophageal reflux disease) 06/29/2017  . Left arm numbness   . Cerebral embolism with cerebral infarction 06/17/2017  . TIA (transient ischemic attack) 06/17/2017  . Chronic back pain 12/29/2016  . Depression 12/15/2016  . Vitamin D deficiency 12/09/2016  . Right hip pain 12/07/2016  . Hypokalemia 12/07/2016  . Type 2 diabetes mellitus with vascular disease ([place]) 05/31/2016  . Normocytic normochromic anemia 05/31/2016  . Chest pain 05/31/2016  . CAD (coronary artery disease) 01/06/2016  . DVT (deep venous thrombosis) ([place]) 01/06/2016  . Lactic acidosis 05/28/2014  . Nonspecific chest pain 01/29/2014  . Uncontrolled secondary diabetes with peripheral neuropathy ([place]) 01/29/2014  . Obesity, Class III, BMI 40-49.9 (morbid obesity) ([place]) 01/29/2014  . Snoring 01/29/2014  . Dyslipidemia 01/29/2014  .  HTN (hypertension) 01/29/2014  . Abnormal nuclear stress test 01/29/2014    Past Surgical History:  Procedure Laterality Date  . CARDIAC CATHETERIZATION N/A 05/31/2016   Procedure: Left Heart Cath and Coronary Angiography;  Surgeon: Peter M Martinique, MD;  Location: [facility] CV LAB;  Service: Cardiovascular;  Laterality: N/A;  . CARDIAC CATHETERIZATION N/A 05/31/2016   Procedure: Intravascular Pressure Wire/FFR Study;  Surgeon: Peter M Martinique, MD;  Location: [place] CV LAB;  Service: Cardiovascular;  Laterality: N/A;  . CARDIAC CATHETERIZATION N/A 05/31/2016    Procedure: Coronary Stent Intervention;  Surgeon: Peter M Martinique, MD;  Location: [place] CV LAB;  Service: Cardiovascular;  Laterality: N/A;  . LEFT HEART CATH AND CORONARY ANGIOGRAPHY N/A 12/08/2017   Procedure: LEFT HEART CATH AND CORONARY ANGIOGRAPHY;  Surgeon: Leonie Man, MD;  Location: [place] [facility] CV LAB;  Service: Cardiovascular;  Laterality: N/A;  . LEFT HEART CATHETERIZATION WITH CORONARY ANGIOGRAM N/A 02/03/2014   Procedure: LEFT HEART CATHETERIZATION WITH CORONARY ANGIOGRAM;  Surgeon: Pixie Casino, MD;  Location: [facility] CATH LAB;  Service: Cardiovascular;  Laterality: N/A;  . left leg stent           Home Medications    Prior to Admission medications   Medication Sig Start Date End Date Taking? Authorizing Provider  aspirin 81 MG chewable tablet Chew 1 tablet (81 mg total) by mouth daily. 06/01/16   Elwin Mocha, MD  atorvastatin (LIPITOR) 80 MG tablet Take 1 tablet (80 mg total) by mouth daily. 01/26/17   Strader, Fransisco Hertz, PA-C  benzonatate (TESSALON) 200 MG capsule Take 1 capsule (200 mg total) by mouth 2 (two) times daily as needed for cough. 02/23/18   Raylene Everts, MD  carvedilol (COREG) 25 MG tablet Take 1 tablet (25 mg total) by mouth 2 (two) times daily with a meal. 01/26/17   Strader, [place] M, PA-C  cetirizine (ZYRTEC) 10 MG tablet Take 1 tablet (10 mg total) by mouth daily. 02/23/18   Raylene Everts, MD  DULoxetine (CYMBALTA) 60 MG capsule Take 1 capsule (60 mg total) by mouth daily. 03/08/18   Marcial Pacas, MD  famotidine (PEPCID) 40 MG tablet Take 1 tablet (40 mg total) by mouth at bedtime. Patient taking differently: Take 40 mg by mouth daily.  06/18/17   Barton Dubois, MD  furosemide (LASIX) 40 MG tablet Take 1 tablet (40 mg total) by mouth every evening. 12/09/17   Velvet Bathe, MD  furosemide (LASIX) 80 MG tablet Take 1 tablet (80 mg total) by mouth daily. 12/10/17   Velvet Bathe, MD  glipiZIDE (GLUCOTROL) 10 MG tablet Take 10 mg by mouth 2 (two)  times daily. 05/30/17   [provider]  hydrALAZINE (APRESOLINE) 25 MG tablet Take 3 tablets (75 mg total) by mouth 3 (three) times daily. 01/30/18   Barrett, Evelene Croon, PA-C  hydrALAZINE (APRESOLINE) 50 MG tablet TAKE 1 TABLET (50 MG TOTAL) BY MOUTH 3 (THREE) TIMES DAILY. 03/27/18   Lelon Perla, MD  Insulin Detemir (LEVEMIR) 100 UNIT/ML Pen Inject 20 Units into the skin daily at 10 pm. Patient taking differently: Inject 55 Units into the skin daily at 10 pm.  06/18/17   Barton Dubois, MD  Insulin Pen Needle 31G X 5 MM MISC Use 1 needle daily to inject insulin as prescribed 06/18/17   Barton Dubois, MD  isosorbide dinitrate (ISORDIL) 30 MG tablet Take 1 tablet (30 mg total) by mouth every morning. 12/10/17   Velvet Bathe, MD  lisinopril (  PRINIVIL,ZESTRIL) 10 MG tablet Take 1 tablet (10 mg total) by mouth daily. 12/10/17   Velvet Bathe, MD  LYRICA 100 MG capsule Take 100 mg by mouth 2 (two) times daily. 11/29/17   [provider]  metFORMIN (GLUCOPHAGE-XR) 500 MG 24 hr tablet Take 2 tablets (1,000 mg total) by mouth 2 (two) times daily. 07/02/17   Barton Dubois, MD  nitroGLYCERIN (NITROSTAT) 0.4 MG SL tablet Place 0.4 mg under the tongue every 5 (five) minutes as needed for chest pain. 12/17/16   [provider]  ondansetron (ZOFRAN ODT) 4 MG disintegrating tablet Take 1 tablet (4 mg total) by mouth every 8 (eight) hours as needed for nausea or vomiting. 06/09/17   Varney Biles, MD  ondansetron (ZOFRAN) 4 MG tablet Take 1 tablet (4 mg total) every 8 (eight) hours as needed by mouth for nausea or vomiting. 09/02/17   [name], PA-C  oseltamivir (TAMIFLU) 75 MG capsule Take 1 capsule (75 mg total) by mouth every 12 (twelve) hours. 02/23/18   Raylene Everts, MD  oxyCODONE (ROXICODONE) 15 MG immediate release tablet Take 15 mg 3 (three) times daily as needed by mouth for pain.  08/28/17   [provider]  pantoprazole (PROTONIX) 40 MG tablet Take 1 tablet (40 mg  total) by mouth 2 (two) times daily. 06/18/17   Barton Dubois, MD  potassium chloride SA (K-DUR,KLOR-CON) 20 MEQ tablet Take 40 mEq by mouth daily.     [provider]  propranolol (INDERAL) 80 MG tablet Take 80 mg by mouth.    [provider]  sertraline (ZOLOFT) 50 MG tablet Take 50 mg by mouth daily.    [provider]  sitaGLIPtin (JANUVIA) 100 MG tablet Take 100 mg by mouth daily.    [provider]  terbinafine (LAMISIL) 250 MG tablet Take 250 mg by mouth daily. 12/07/17   [provider]  tiZANidine (ZANAFLEX) 2 MG tablet Take 2 mg by mouth every 6 (six) hours as needed for muscle spasms.    [provider]  traZODone (DESYREL) 50 MG tablet Take 1 tablet (50 mg total) by mouth at bedtime. 12/15/16   Florinda Marker, MD  Vitamin D, Ergocalciferol, (DRISDOL) 50000 units CAPS capsule Take 1 capsule by mouth every Monday. 11/06/17   [provider]    Family History Family History  Problem Relation Age of Onset  . Cancer Father   . Hypertension Mother   . Diabetes Mother   . Breast cancer Mother   . Hypertension Brother   . Diabetes Brother   . Hypertension Sister   . Diabetes Sister     Social History Social History   Tobacco Use  . Smoking status: Never Smoker  . Smokeless tobacco: Never Used  Substance Use Topics  . Alcohol use: No  . Drug use: No     Allergies   Coconut flavor [flavoring agent]; Coconut oil; Ibuprofen; Aleve [naproxen]; and Nsaids   Review of Systems Review of Systems  Constitutional: Negative for chills, diaphoresis and fever.  Eyes: Negative for visual disturbance.  Respiratory: Negative for shortness of breath.   Cardiovascular: Positive for leg swelling (bilateral). Negative for chest pain (had chest pain last night, resolved since 7am today).  Gastrointestinal: Negative for abdominal pain, nausea and vomiting.  Genitourinary: Negative for difficulty urinating and frequency.    Musculoskeletal: Positive for myalgias (bilateral leg swelling). Negative for gait problem.  Skin: Positive for wound (venous stasis ulcers bilaterally).  Neurological: Negative for weakness,  light-headedness and headaches.  Psychiatric/Behavioral: Negative for agitation.     Physical Exam Updated Vital Signs BP 133/81   Pulse 79   Temp 98.2 F (36.8 C) (Oral)   Resp 17   Ht 5\' 10"  (1.778 m)   Wt 136.1 kg (300 lb)   SpO2 97%   BMI 43.05 kg/m   Physical Exam  Constitutional: He is oriented to person, place, and time. He appears well-developed and well-nourished. No distress.  Sitting at bedside in no apparent distress, nontoxic-appearing.  HENT:  Head: Normocephalic and atraumatic.  Mouth/Throat: Oropharynx is clear and moist. No oropharyngeal exudate.  Eyes: Pupils are equal, round, and reactive to light. Conjunctivae are normal. Right eye exhibits no discharge. Left eye exhibits no discharge.  Neck: Normal range of motion. Neck supple. No JVD present. No tracheal deviation present.  Cardiovascular: Normal rate, regular rhythm and intact distal pulses.  Pulmonary/Chest: Effort normal and breath sounds normal. No respiratory distress.  Abdominal: Soft. There is no tenderness.  Musculoskeletal:  Bilateral shins with superficial open wounds.  No active drainage.  No surrounding erythema, warmth or induration.  DP pulses 2+ bilaterally.  See picture below.  Neurological: He is alert and oriented to person, place, and time. Coordination normal.  Skin: Skin is warm and dry. He is not diaphoretic.  Psychiatric: He has a normal mood and affect. His behavior is normal.  Nursing note and vitals reviewed.      ED Treatments / Results  Labs (all labs ordered are listed, but only abnormal results are displayed) Labs Reviewed  COMPREHENSIVE METABOLIC PANEL - Abnormal; Notable for the following components:      Result Value   Potassium 3.2 (*)    Glucose, Bld 229 (*)    Alkaline  Phosphatase 37 (*)    All other components within normal limits  CBC WITH DIFFERENTIAL/PLATELET - Abnormal; Notable for the following components:   RBC 3.90 (*)    Hemoglobin 11.3 (*)    HCT 36.1 (*)    All other components within normal limits  I-STAT CG4 LACTIC ACID, ED - Abnormal; Notable for the following components:   Lactic Acid, Venous 4.28 (*)    All other components within normal limits  I-STAT CG4 LACTIC ACID, ED - Abnormal; Notable for the following components:   Lactic Acid, Venous 4.01 (*)    All other components within normal limits  I-STAT TROPONIN, ED    EKG None  Radiology No results found.  Procedures Procedures (including critical care time)  Medications Ordered in ED Medications  sodium chloride 0.9 % bolus 1,000 mL (0 mLs Intravenous Stopped 04/01/18 1603)  morphine 4 MG/ML injection 4 mg (4 mg Intravenous Given 04/01/18 1442)  ondansetron (ZOFRAN) injection 4 mg (4 mg Intravenous Given 04/01/18 1442)  potassium chloride SA (K-DUR,KLOR-CON) CR tablet 40 mEq (40 mEq Oral Given 04/01/18 1604)  mupirocin cream (BACTROBAN) 2 % ( Topical Given 04/01/18 1606)     Initial Impression / Assessment and Plan / ED Course  I have reviewed the triage vital signs and the nursing notes.  Pertinent labs & imaging results that were available during my care of the patient were reviewed by me and considered in my medical decision making (see chart for details).    Presents to the emergency department for evaluation of painful bilateral venous stasis ulcers which according to patient were draining brown pus yesterday evening.  On exam he is afebrile and in NAD.  He has shallow ulcers present over bilateral  shins. No streaking over the upper thigh. No active drainage. Bilateral lower extremities are neurovascularly intact.   Lab work reviewed.  He has a lactic acidosis of 4.28.  CBC without leukocytosis, hemoglobin baseline. CMP with hypokalemia (3.2), although this appears improved  from baseline. Replaced with PO potassium in the ED. His glucose is elevated at 229, counseled pt to follow up with his PCP for further management. No anion gap acidosis. Discussed this patient with Dr. Cathleen Fears who also saw the patient. Given patient afebrile, non-toxic, no leukocytosis and legs do not appear overtly infected agree that his lactic acidosis is likely due to his Metformin use.  Patient's wounds redressed in the emergency department. Have counseled him on daily dressing changes. Will cover him with doxycycline for infection given he reports noting pus coming from wounds yesterday evening. Patient instructed to follow up at his appointment with wound care in two days. Discussed reasons to return to the emergency department and he agrees and voiced understanding to the above plan and appears reliable for follow-up.  This was a shared visit with Dr. Cathleen Fears who also saw the patient and agrees with plan and discharge home.  Final Clinical Impressions(s) / ED Diagnoses   Final diagnoses:  Skin breakdown  Visit for wound check    ED Discharge Orders        Ordered    doxycycline (VIBRAMYCIN) 100 MG capsule  2 times daily     04/01/18 1546       Glyn Ade, PA-C 04/01/18 1701    Orlie Dakin, MD 04/01/18 [phone]

## 2018-04-03 ENCOUNTER — Encounter: Payer: Medicaid Other | Attending: Physician Assistant | Admitting: Physician Assistant

## 2018-04-03 DIAGNOSIS — K589 Irritable bowel syndrome without diarrhea: Secondary | ICD-10-CM | POA: Insufficient documentation

## 2018-04-03 DIAGNOSIS — Z8711 Personal history of peptic ulcer disease: Secondary | ICD-10-CM | POA: Insufficient documentation

## 2018-04-03 DIAGNOSIS — E1140 Type 2 diabetes mellitus with diabetic neuropathy, unspecified: Secondary | ICD-10-CM | POA: Diagnosis not present

## 2018-04-03 DIAGNOSIS — I890 Lymphedema, not elsewhere classified: Secondary | ICD-10-CM | POA: Insufficient documentation

## 2018-04-03 DIAGNOSIS — L97921 Non-pressure chronic ulcer of unspecified part of left lower leg limited to breakdown of skin: Secondary | ICD-10-CM | POA: Insufficient documentation

## 2018-04-03 DIAGNOSIS — I5032 Chronic diastolic (congestive) heart failure: Secondary | ICD-10-CM | POA: Insufficient documentation

## 2018-04-03 DIAGNOSIS — L97811 Non-pressure chronic ulcer of other part of right lower leg limited to breakdown of skin: Secondary | ICD-10-CM | POA: Diagnosis not present

## 2018-04-03 DIAGNOSIS — I2510 Atherosclerotic heart disease of native coronary artery without angina pectoris: Secondary | ICD-10-CM | POA: Insufficient documentation

## 2018-04-03 DIAGNOSIS — M1990 Unspecified osteoarthritis, unspecified site: Secondary | ICD-10-CM | POA: Diagnosis not present

## 2018-04-03 DIAGNOSIS — E11622 Type 2 diabetes mellitus with other skin ulcer: Secondary | ICD-10-CM | POA: Diagnosis not present

## 2018-04-03 DIAGNOSIS — I110 Hypertensive heart disease with heart failure: Secondary | ICD-10-CM | POA: Insufficient documentation

## 2018-04-03 DIAGNOSIS — E1151 Type 2 diabetes mellitus with diabetic peripheral angiopathy without gangrene: Secondary | ICD-10-CM | POA: Insufficient documentation

## 2018-04-03 DIAGNOSIS — Z823 Family history of stroke: Secondary | ICD-10-CM | POA: Insufficient documentation

## 2018-04-03 DIAGNOSIS — E669 Obesity, unspecified: Secondary | ICD-10-CM | POA: Diagnosis not present

## 2018-04-03 DIAGNOSIS — Z86718 Personal history of other venous thrombosis and embolism: Secondary | ICD-10-CM | POA: Diagnosis not present

## 2018-04-03 DIAGNOSIS — Z6841 Body Mass Index (BMI) 40.0 and over, adult: Secondary | ICD-10-CM | POA: Diagnosis not present

## 2018-04-03 DIAGNOSIS — Z8673 Personal history of transient ischemic attack (TIA), and cerebral infarction without residual deficits: Secondary | ICD-10-CM | POA: Diagnosis not present

## 2018-04-05 ENCOUNTER — Encounter (HOSPITAL_COMMUNITY): Payer: Self-pay | Admitting: Emergency Medicine

## 2018-04-05 ENCOUNTER — Ambulatory Visit (HOSPITAL_COMMUNITY)
Admission: EM | Admit: 2018-04-05 | Discharge: 2018-04-05 | Disposition: A | Discharge status: Home / Self Care | Payer: Medicaid Other | Attending: Emergency Medicine | Admitting: Emergency Medicine

## 2018-04-05 DIAGNOSIS — M545 Low back pain, unspecified: Secondary | ICD-10-CM

## 2018-04-05 MED ORDER — DICLOFENAC SODIUM 1 % TD GEL: 1.0000 "application " | Freq: Four times a day (QID) | TRANSDERMAL | 0 refills | Status: DC

## 2018-04-05 MED ORDER — METHOCARBAMOL 750 MG PO TABS: 750.0000 mg | ORAL_TABLET | ORAL | 0 refills | Status: DC

## 2018-04-05 MED ORDER — ACETAMINOPHEN 500 MG PO TABS
1000.0000 mg | ORAL_TABLET | Freq: Once | ORAL | Status: AC
  Administered 2018-04-05: 975 mg via ORAL

## 2018-04-05 MED ORDER — ACETAMINOPHEN 325 MG PO TABS
ORAL_TABLET | ORAL | Status: AC
Start: 2018-04-05 — End: ?
  Filled 2018-04-05: qty 3

## 2018-04-05 MED ORDER — KETOROLAC TROMETHAMINE 30 MG/ML IJ SOLN
30.0000 mg | Freq: Once | INTRAMUSCULAR | Status: AC
  Administered 2018-04-05: 30 mg via INTRAMUSCULAR

## 2018-04-05 MED ORDER — KETOROLAC TROMETHAMINE 30 MG/ML IJ SOLN
INTRAMUSCULAR | Status: AC
  Filled 2018-04-05: qty 1

## 2018-04-05 NOTE — ED Provider Notes (Signed)
HPI  SUBJECTIVE:  [name] is a 57 y.o. male who presents with 3 to 4 days of intermittent, bilateral low back pain that lasts hours and then resolves.  He states that this pain is identical to previous episodes of back pain but he states it is getting worse.  He is requesting a shot of Toradol which has worked well for him in the past.  Tried lidocaine patch, oxycodone 15 mg rx'd to him for chronic pain.  He has not tried any oral NSAIDs, with h/o gastric ulcers but can have toradol.  He has not also tried any Tylenol. No alleviating factors. symptoms worse with walking, standing for long period of time, lifting. He reports nausea, but denies Vomiting, fevers, flank pain, abdominal pain, urinary urgency, frequency, dysuria, cloudy or odorous urine, hematuria.  No syncope. No saddle anesthesia, distal weakness new numbness, bilateral radicular leg pain/weakness, fevers/night sweats, recent h/o trauma, neurological deficits,  bladder/ bowel incontinence, urinary retention, h/o CA / multiple myleoma, unexplained weight loss, pain worse at night,  h/o prolonged steroid use, h/o osteopenia, h/o IVDU, h/o HIV, known AAA.  States feels similar to previous episodes of back pain. no h/o UTI, pyelonephritis, nephrolithiasis. PMH: kidney disease, CHF , DM with neuropathy, TIA, DVT, CAD, spinal stenosis. PCP; [name], FNP]   Past Medical History:  Diagnosis Date  . Arthritis   . Back pain   . CAD (coronary artery disease)    a. s/p DES to LAD in 05/2016  . Cervical radiculopathy   . Chronic diastolic CHF (congestive heart failure) ([place])   . Chronic pain   . Depression   . DVT (deep venous thrombosis) ([place])   . Hematemesis   . Hepatic steatosis   . Hyperlipidemia   . Hypertension   . IBS (irritable bowel syndrome)   . Morbid obesity ([place])   . OSA (obstructive sleep apnea)   . Pancreatitis   . PE (pulmonary thromboembolism) ([place])   . Peripheral neuropathy   . PUD (peptic ulcer disease)    . Renal disorder   . Stroke [facility])    a. ?details unclear - not seen on imaging when he was admitted in 05/2017 for TIA symptoms which were felt due to cervical radiculopathy.  . Thoracic aortic ectasia ([facility])    a. 4.3cm ectatic ascending thoracic aorta by CT 06/2017.   . Type 2 diabetes mellitus ([place])     Past Surgical History:  Procedure Laterality Date  . CARDIAC CATHETERIZATION N/A 05/31/2016   Procedure: Left Heart Cath and Coronary Angiography;  Surgeon: Peter M Martinique, MD;  Location: [place] CV LAB;  Service: Cardiovascular;  Laterality: N/A;  . CARDIAC CATHETERIZATION N/A 05/31/2016   Procedure: Intravascular Pressure Wire/FFR Study;  Surgeon: Peter M Martinique, MD;  Location: [place] CV LAB;  Service: Cardiovascular;  Laterality: N/A;  . CARDIAC CATHETERIZATION N/A 05/31/2016   Procedure: Coronary Stent Intervention;  Surgeon: Peter M Martinique, MD;  Location: [place] CV LAB;  Service: Cardiovascular;  Laterality: N/A;  . LEFT HEART CATH AND CORONARY ANGIOGRAPHY N/A 12/08/2017   Procedure: LEFT HEART CATH AND CORONARY ANGIOGRAPHY;  Surgeon: Leonie Man, MD;  Location: [place] CV LAB;  Service: Cardiovascular;  Laterality: N/A;  . LEFT HEART CATHETERIZATION WITH CORONARY ANGIOGRAM N/A 02/03/2014   Procedure: LEFT HEART CATHETERIZATION WITH CORONARY ANGIOGRAM;  Surgeon: Pixie Casino, MD;  Location: [facility] CATH LAB;  Service: Cardiovascular;  Laterality: N/A;  . left leg stent  Family History  Problem Relation Age of Onset  . Cancer Father   . Hypertension Mother   . Diabetes Mother   . Breast cancer Mother   . Hypertension Brother   . Diabetes Brother   . Hypertension Sister   . Diabetes Sister     Social History   Tobacco Use  . Smoking status: Never Smoker  . Smokeless tobacco: Never Used  Substance Use Topics  . Alcohol use: No  . Drug use: No    No current facility-administered medications for this encounter.   Current Outpatient Medications:  .   aspirin 81 MG chewable tablet, Chew 1 tablet (81 mg total) by mouth daily., Disp: 30 tablet, Rfl: 0 .  atorvastatin (LIPITOR) 80 MG tablet, Take 1 tablet (80 mg total) by mouth daily., Disp: 90 tablet, Rfl: 3 .  carvedilol (COREG) 25 MG tablet, Take 1 tablet (25 mg total) by mouth 2 (two) times daily with a meal., Disp: 180 tablet, Rfl: 3 .  doxycycline (VIBRAMYCIN) 100 MG capsule, Take 1 capsule (100 mg total) by mouth 2 (two) times daily., Disp: 14 capsule, Rfl: 0 .  DULoxetine (CYMBALTA) 60 MG capsule, Take 1 capsule (60 mg total) by mouth daily., Disp: 30 capsule, Rfl: 8 .  famotidine (PEPCID) 40 MG tablet, Take 1 tablet (40 mg total) by mouth at bedtime. (Patient taking differently: Take 40 mg by mouth daily. ), Disp: 30 tablet, Rfl: 2 .  furosemide (LASIX) 40 MG tablet, Take 1 tablet (40 mg total) by mouth every evening., Disp: 30 tablet, Rfl:  .  furosemide (LASIX) 80 MG tablet, Take 1 tablet (80 mg total) by mouth daily., Disp: 30 tablet, Rfl:  .  glipiZIDE (GLUCOTROL) 10 MG tablet, Take 10 mg by mouth 2 (two) times daily., Disp: , Rfl: 4 .  hydrALAZINE (APRESOLINE) 50 MG tablet, TAKE 1 TABLET (50 MG TOTAL) BY MOUTH 3 (THREE) TIMES DAILY., Disp: 90 tablet, Rfl: 3 .  Insulin Detemir (LEVEMIR) 100 UNIT/ML Pen, Inject 20 Units into the skin daily at 10 pm. (Patient taking differently: Inject 55 Units into the skin daily at 10 pm. ), Disp: 15 mL, Rfl: 3 .  Insulin Pen Needle 31G X 5 MM MISC, Use 1 needle daily to inject insulin as prescribed, Disp: 100 each, Rfl: 2 .  isosorbide dinitrate (ISORDIL) 30 MG tablet, Take 1 tablet (30 mg total) by mouth every morning., Disp: 30 tablet, Rfl: 0 .  lisinopril (PRINIVIL,ZESTRIL) 10 MG tablet, Take 1 tablet (10 mg total) by mouth daily., Disp: 30 tablet, Rfl: 0 .  LYRICA 100 MG capsule, Take 100 mg by mouth 2 (two) times daily., Disp: , Rfl: 4 .  metFORMIN (GLUCOPHAGE-XR) 500 MG 24 hr tablet, Take 2 tablets (1,000 mg total) by mouth 2 (two) times daily.,  Disp: , Rfl:  .  nitroGLYCERIN (NITROSTAT) 0.4 MG SL tablet, Place 0.4 mg under the tongue every 5 (five) minutes as needed for chest pain., Disp: , Rfl: 4 .  potassium chloride SA (K-DUR,KLOR-CON) 20 MEQ tablet, Take 40 mEq by mouth daily. , Disp: , Rfl:  .  sertraline (ZOLOFT) 50 MG tablet, Take 50 mg by mouth daily., Disp: , Rfl:  .  sitaGLIPtin (JANUVIA) 100 MG tablet, Take 100 mg by mouth daily., Disp: , Rfl:  .  traZODone (DESYREL) 50 MG tablet, Take 1 tablet (50 mg total) by mouth at bedtime., Disp: 30 tablet, Rfl: 11 .  Vitamin D, Ergocalciferol, (DRISDOL) 50000 units CAPS capsule, Take 1 capsule  by mouth every Monday., Disp: , Rfl: 2 .  diclofenac sodium (VOLTAREN) 1 % GEL, Apply 1 application topically 4 (four) times daily., Disp: 100 g, Rfl: 0 .  hydrALAZINE (APRESOLINE) 25 MG tablet, Take 3 tablets (75 mg total) by mouth 3 (three) times daily., Disp: 540 tablet, Rfl: 3 .  methocarbamol (ROBAXIN) 750 MG tablet, Take 1 tablet (750 mg total) by mouth every 4 (four) hours., Disp: 40 tablet, Rfl: 0 .  oxyCODONE (ROXICODONE) 15 MG immediate release tablet, Take 15 mg 3 (three) times daily as needed by mouth for pain. , Disp: , Rfl: 0 .  pantoprazole (PROTONIX) 40 MG tablet, Take 1 tablet (40 mg total) by mouth 2 (two) times daily., Disp: 60 tablet, Rfl: 1 .  propranolol (INDERAL) 80 MG tablet, Take 80 mg by mouth., Disp: , Rfl:  .  terbinafine (LAMISIL) 250 MG tablet, Take 250 mg by mouth daily., Disp: , Rfl: 0  Allergies  Allergen Reactions  . Coconut Flavor [Flavoring Agent] Hives  . Coconut Oil Hives  . Ibuprofen Other (See Comments)    Made gastric ulcers worse  . Aleve [Naproxen] Other (See Comments)    DUE TO KIDNEYS  . Nsaids Other (See Comments)    Stomach ulcers      ROS  As noted in HPI.   Physical Exam  BP (!) 148/98   Pulse 89   Temp 98.5 F (36.9 C)   Resp 18   SpO2 98%   Constitutional: Well developed, well nourished, no acute distress Eyes:  EOMI,  conjunctiva normal bilaterally HENT: Normocephalic, atraumatic,mucus membranes moist Respiratory: Normal inspiratory effort Cardiovascular: Normal rate GI: nondistended. No suprapubic tenderness skin: No rash, skin intact Musculoskeletal: no CVAT. + Bilateral paralumbar tenderness, + muscle spasm. No bony tenderness. Bilateral lower extremities nontender.  baseline ROM with intact DP  Pulses.  Pain with internal/external rotation of the right hip but patient states that this is not new. No pain with passive flex/extension hips bilaterally.  Pain Aggravated with right hip flexion against resistance.  SLR neg bilaterally. Sensation baseline light touch bilaterally for Pt, DTR's symmetric and intact bilaterally KJ, Motor symmetric bilateral 5/5 hip flexion, quadriceps, hamstrings, EHL, foot dorsiflexion, foot plantarflexion, gait somewhat antalgic but without apparent new ataxia.  Uses cane for ambulation.   Neurologic: Alert & oriented x 3, no focal neuro deficits Psychiatric: Speech and behavior appropriate   ED Course   Medications  ketorolac (TORADOL) 30 MG/ML injection 30 mg (30 mg Intramuscular Given 04/05/18 1143)  acetaminophen (TYLENOL) tablet 1,000 mg (975 mg Oral Given 04/05/18 1142)    No orders of the defined types were placed in this encounter.   No results found for this or any previous visit (from the past 24 hour(s)). No results found.  ED Clinical Impression  Acute bilateral low back pain without sciatica   ED Assessment/Plan  [place] Narcotic database reviewed for this patient.  Patient was prescribed 15 mg of oxycodone #90 on 5/23 which is a 1 month supply.  He was also prescribed 60 Lyrica on 5/13.  No historical evidence of uti, nephrolithiasis  No evidence of spinal cord involvement based on H&P. Pt describing typical back pain, has been < 6 week duration. No historical red flags as noted in HPI. No physical red flags such as fever, bony tenderness, lower extremity  weakness, saddle anesthesia. Imaging not indicated at this time.   Kidney function from 6/9 normal.  Giving 30 mg of Toradol IM with 1 g  of Tylenol p.o.  Will send home with Tylenol 1 g 3 or 4 times a day for him to take with his OxyContin as prescribed.  He is not taking any other Tylenol-containing products currently.  Also Robaxin and Voltaren gel.  Follow-up with his primary care physician in several days if not getting better.  To the ER if he gets worse.  Discussed MDM, treatment plan, and plan for follow-up with patient. Discussed sn/sx that should prompt return to the ED. patient agrees with plan.   Meds ordered this encounter  Medications  . ketorolac (TORADOL) 30 MG/ML injection 30 mg  . acetaminophen (TYLENOL) tablet 1,000 mg  . diclofenac sodium (VOLTAREN) 1 % GEL    Sig: Apply 1 application topically 4 (four) times daily.    Dispense:  100 g    Refill:  0  . methocarbamol (ROBAXIN) 750 MG tablet    Sig: Take 1 tablet (750 mg total) by mouth every 4 (four) hours.    Dispense:  40 tablet    Refill:  0    *This clinic note was created using [employer]. Therefore, there may be occasional mistakes despite careful proofreading.  ?    Melynda Ripple, MD 04/05/18 1424

## 2018-04-05 NOTE — Discharge Instructions (Addendum)
1 g of Tylenol 3 or 4 times a day as needed for pain.  Continue your oxycodone as tolerated.  Try the Voltaren gel, which is like ibuprofen, but it will not affect your stomach and the Robaxin which is a muscle relaxant.

## 2018-04-05 NOTE — ED Triage Notes (Signed)
Pt presents with complaints of pain in his lower back, pt has known bulging disc. Ambulatory with cane.

## 2018-04-05 NOTE — Progress Notes (Signed)
BRIGHTEN, ORNDOFF ([id number]) Visit Report for 04/03/2018 Abuse/Suicide Risk Screen Details Patient Name: [name], [name] Date of Service: 04/03/2018 9:45 AM Medical Record Number: [id number] Patient Account Number: [id number] Date of Birth/Sex: 1961-05-13 (57 y.o. Male) Treating RN: Montey Hora Primary Care Mirra Basilio: [name] Other Clinician: Referring Shivan Hodes: Blanchie Dessert Treating Shawnya Mayor/Extender: Melburn Hake, [name] Weeks in Treatment: 0 Abuse/Suicide Risk Screen Items Answer ABUSE/SUICIDE RISK SCREEN: Has anyone close to you tried to hurt or harm you recentlyo No Do you feel uncomfortable with anyone in your familyo No Has anyone forced you do things that you didnot want to doo No Do you have any thoughts of harming yourselfo No Patient displays signs or symptoms of abuse and/or neglect. No Electronic Signature(s) Signed: 04/03/2018 3:48:56 PM By: Montey Hora Entered By: Montey Hora on 04/03/2018 10:05:54 Newmann, Chadwin L. ([id number]) -------------------------------------------------------------------------------- Activities of Daily Living Details Patient Name: [name], [name] Date of Service: 04/03/2018 9:45 AM Medical Record Number: [id number] Patient Account Number: [id number] Date of Birth/Sex: 06/05/1961 (57 y.o. Male) Treating RN: Montey Hora Primary Care Elmore Hyslop: [name] Other Clinician: Referring Leviticus Harton: Blanchie Dessert Treating Merlyn Bollen/Extender: Melburn Hake, [name] Weeks in Treatment: 0 Activities of Daily Living Items Answer Activities of Daily Living (Please select one for each item) Drive Automobile Completely Able Take Medications Completely Able Use Telephone Completely Able Care for Appearance Completely Able Use Toilet Completely Able Bath / Shower Completely Able Dress Self Completely Able Feed Self Completely Able Walk Completely Able Get In / Out Bed Completely Able Housework Completely Able Prepare Meals  Completely Able Handle Money Completely Able Shop for Self Completely Able Electronic Signature(s) Signed: 04/03/2018 3:48:56 PM By: Montey Hora Entered By: Montey Hora on 04/03/2018 10:06:16 Jesus, Requan L. ([id number]) -------------------------------------------------------------------------------- Education Assessment Details Patient Name: [name], [name] Date of Service: 04/03/2018 9:45 AM Medical Record Number: [id number] Patient Account Number: [id number] Date of Birth/Sex: 23-Oct-1961 (57 y.o. Male) Treating RN: Montey Hora Primary Care Oseias Horsey: [name] Other Clinician: Referring Yuji Walth: Blanchie Dessert Treating Cherish Runde/Extender: Melburn Hake, [name] Weeks in Treatment: 0 Primary Learner Assessed: Patient Learning Preferences/Education Level/Primary Language Learning Preference: Explanation, Demonstration Highest Education Level: College or Above Preferred Language: English Cognitive Barrier Assessment/Beliefs Language Barrier: No Translator Needed: No Memory Deficit: No Emotional Barrier: No Cultural/Religious Beliefs Affecting Medical Care: No Physical Barrier Assessment Impaired Vision: No Impaired Hearing: No Decreased Hand dexterity: No Knowledge/Comprehension Assessment Knowledge Level: Medium Comprehension Level: Medium Ability to understand written Medium instructions: Ability to understand verbal Medium instructions: Motivation Assessment Anxiety Level: Calm Cooperation: Cooperative Education Importance: Acknowledges Need Interest in Health Problems: Asks Questions Perception: Coherent Willingness to Engage in Self- Medium Management Activities: Readiness to Engage in Self- Medium Management Activities: Electronic Signature(s) Signed: 04/03/2018 3:48:56 PM By: Montey Hora Entered By: Montey Hora on 04/03/2018 10:08:18 Sallie, Jareth Carlean Jews  ([id number]) -------------------------------------------------------------------------------- Fall Risk Assessment Details Patient Name: [name], [name] Date of Service: 04/03/2018 9:45 AM Medical Record Number: [id number] Patient Account Number: [id number] Date of Birth/Sex: [birth year] (57 y.o. Male) Treating RN: Montey Hora Primary Care Burney Calzadilla: [name] Other Clinician: Referring Altha Sweitzer: Blanchie Dessert Treating Jackolyn Geron/Extender: Melburn Hake, [name] Weeks in Treatment: 0 Fall Risk Assessment Items Have you had 2 or more falls in the last 12 monthso 0 No Have you had any fall that resulted in injury in the last 12 monthso 0 No FALL RISK ASSESSMENT: History of falling - immediate or within 3 months 0 No Secondary diagnosis 0 No Ambulatory aid None/bed rest/wheelchair/nurse 0 Yes Crutches/cane/walker 0 No Furniture  0 No IV Access/Saline Lock 0 No Gait/Training Normal/bed rest/immobile 0 No Weak 10 Yes Impaired 0 No Mental Status Oriented to own ability 0 Yes Electronic Signature(s) Signed: 04/03/2018 3:48:56 PM By: Montey Hora Entered By: Montey Hora on 04/03/2018 10:08:26 Machi, Jaidyn L. ([id number]) -------------------------------------------------------------------------------- Foot Assessment Details Patient Name: [name], [name] Date of Service: 04/03/2018 9:45 AM Medical Record Number: [id number] Patient Account Number: [id number] Date of Birth/Sex: 10/30/1960 (57 y.o. Male) Treating RN: Montey Hora Primary Care Aidee Latimore: [name] Other Clinician: Referring Sherial Ebrahim: Blanchie Dessert Treating Tirth Cothron/Extender: Melburn Hake, [name] Weeks in Treatment: 0 Foot Assessment Items Site Locations + = Sensation present, - = Sensation absent, C = Callus, U = Ulcer R = Redness, W = Warmth, M = Maceration, PU = Pre-ulcerative lesion F = Fissure, S = Swelling, D = Dryness Assessment Right: Left: Other Deformity: No No Prior Foot Ulcer: No No Prior  Amputation: No No Charcot Joint: No No Ambulatory Status: Ambulatory Without Help Gait: Steady Electronic Signature(s) Signed: 04/03/2018 3:48:56 PM By: Montey Hora Entered By: Montey Hora on 04/03/2018 10:20:32 Shelnutt, Nnamdi L. ([id number]) -------------------------------------------------------------------------------- Nutrition Risk Assessment Details Patient Name: [name], Danilo L. Date of Service: 04/03/2018 9:45 AM Medical Record Number: [id number] Patient Account Number: [id number] Date of Birth/Sex: August 02, 1961 (57 y.o. Male) Treating RN: Montey Hora Primary Care Layza Summa: [name] Other Clinician: Referring Keegan Bensch: Blanchie Dessert Treating Kenley Rettinger/Extender: [name], [name] Weeks in Treatment: 0 Height (in): Weight (lbs): Body Mass Index (BMI): Nutrition Risk Assessment Items NUTRITION RISK SCREEN: I have an illness or condition that made me change the kind and/or amount of 0 No food I eat I eat fewer than two meals per day 0 No I eat few fruits and vegetables, or milk products 0 No I have three or more drinks of beer, liquor or wine almost every day 0 No I have tooth or mouth problems that make it hard for me to eat 0 No I don't always have enough money to buy the food I need 0 No I eat alone most of the time 0 No I take three or more different prescribed or over-the-counter drugs a day 1 Yes Without wanting to, I have lost or gained 10 pounds in the last six months 0 No I am not always physically able to shop, cook and/or feed myself 0 No Nutrition Protocols Good Risk Protocol 0 No interventions needed Moderate Risk Protocol Electronic Signature(s) Signed: 04/03/2018 3:48:56 PM By: Montey Hora Entered By: Montey Hora on 04/03/2018 10:08:36

## 2018-04-06 NOTE — Progress Notes (Signed)
WELCOME, FULTS ([id number]) Visit Report for 04/03/2018 Allergy List Details Patient Name: [name], Shray L. Date of Service: 04/03/2018 9:45 AM Medical Record Number: [id number] Patient Account Number: [id number] Date of Birth/Sex: 12/20/1960 (57 y.o. Male) Treating RN: Montey Hora Primary Care Leata Dominy: [name] Other Clinician: Referring Catalaya Garr: Blanchie Dessert Treating Izella Ybanez/Extender: [name], [name] Weeks in Treatment: 0 Allergies Active Allergies ibuprofen naproxen Allergy Notes Electronic Signature(s) Signed: 04/03/2018 3:48:56 PM By: Montey Hora Entered By: Montey Hora on 04/03/2018 10:04:31 Agostinelli, Willford L. ([id number]) -------------------------------------------------------------------------------- [place] Information Details Patient Name: [name], Colson L. Date of Service: 04/03/2018 9:45 AM Medical Record Number: [id number] Patient Account Number: [id number] Date of Birth/Sex: [birth year] (57 y.o. Male) Treating RN: Montey Hora Primary Care Citlalli Weikel: [name] Other Clinician: Referring Beyounce Dickens: Blanchie Dessert Treating Avrielle Fry/Extender: Melburn Hake, [name] Weeks in Treatment: 0 Visit Information Patient Arrived: Ambulatory Arrival Time: 10:02 Accompanied By: self Transfer Assistance: None Patient Identification Verified: Yes Secondary Verification Process Yes Completed: Patient Has Alerts: Yes Patient Alerts: DMII ABI [place] BILATERAL >220 Electronic Signature(s) Signed: 04/03/2018 3:48:56 PM By: Montey Hora Entered By: Montey Hora on 04/03/2018 10:34:52 Cirilo, Hagen L. ([id number]) -------------------------------------------------------------------------------- Clinic Level of Care Assessment Details Patient Name: [name], Kaydon L. Date of Service: 04/03/2018 9:45 AM Medical Record Number: [id number] Patient Account Number: [id number] Date of Birth/Sex: 1961-02-11 (57 y.o. Male) Treating RN: Carolyne Fiscal, Debi Primary Care  Cristopher Ciccarelli: [name] Other Clinician: Referring Shrey Boike: Blanchie Dessert Treating Willodean Leven/Extender: Melburn Hake, [name] Weeks in Treatment: 0 Clinic Level of Care Assessment Items TOOL 2 Quantity Score X - Use when only an EandM is performed on the INITIAL visit 1 0 ASSESSMENTS - Nursing Assessment / Reassessment X - General Physical Exam (combine w/ comprehensive assessment (listed just below) when 1 20 performed on new pt. evals) X- 1 25 Comprehensive Assessment (HX, ROS, Risk Assessments, Wounds Hx, etc.) ASSESSMENTS - Wound and Skin Assessment / Reassessment []  - Simple Wound Assessment / Reassessment - one wound 0 X- 2 5 Complex Wound Assessment / Reassessment - multiple wounds []  - 0 Dermatologic / Skin Assessment (not related to wound area) ASSESSMENTS - Ostomy and/or Continence Assessment and Care []  - Incontinence Assessment and Management 0 []  - 0 Ostomy Care Assessment and Management (repouching, etc.) PROCESS - Coordination of Care []  - Simple Patient / Family Education for ongoing care 0 X- 1 20 Complex (extensive) Patient / Family Education for ongoing care X- 1 10 Staff obtains Programmer, systems, Records, Test Results / Process Orders X- 1 10 Staff telephones HHA, Nursing Homes / Clarify orders / etc []  - 0 Routine Transfer to another Facility (non-emergent condition) []  - 0 Routine Hospital Admission (non-emergent condition) X- 1 15 New Admissions / Biomedical engineer / Ordering NPWT, Apligraf, etc. []  - 0 Emergency Hospital Admission (emergent condition) X- 1 10 Simple Discharge Coordination []  - 0 Complex (extensive) Discharge Coordination PROCESS - Special Needs []  - Pediatric / Minor Patient Management 0 []  - 0 Isolation Patient Management Rackham, Tyse L. ([id number]) []  - 0 Hearing / Language / Visual special needs []  - 0 Assessment of Community assistance (transportation, D/C planning, etc.) []  - 0 Additional assistance / Altered  mentation []  - 0 Support Surface(s) Assessment (bed, cushion, seat, etc.) INTERVENTIONS - Wound Cleansing / Measurement X - Wound Imaging (photographs - any number of wounds) 1 5 []  - 0 Wound Tracing (instead of photographs) []  - 0 Simple Wound Measurement - one wound X- 2 5 Complex Wound Measurement - multiple wounds []  - 0 Simple Wound  Cleansing - one wound X- 2 5 Complex Wound Cleansing - multiple wounds INTERVENTIONS - Wound Dressings []  - Small Wound Dressing one or multiple wounds 0 X- 2 15 Medium Wound Dressing one or multiple wounds []  - 0 Large Wound Dressing one or multiple wounds []  - 0 Application of Medications - injection INTERVENTIONS - Miscellaneous []  - External ear exam 0 []  - 0 Specimen Collection (cultures, biopsies, blood, body fluids, etc.) []  - 0 Specimen(s) / Culture(s) sent or taken to Lab for analysis []  - 0 Patient Transfer (multiple staff / Harrel Lemon Lift / Similar devices) []  - 0 Simple Staple / Suture removal (25 or less) []  - 0 Complex Staple / Suture removal (26 or more) []  - 0 Hypo / Hyperglycemic Management (close monitor of Blood Glucose) X- 1 15 Ankle / Brachial Index (ABI) - do not check if billed separately Has the patient been seen at the hospital within the last three years: Yes Total Score: 190 Level Of Care: New/Established - Level 5 Electronic Signature(s) Signed: 04/03/2018 4:32:24 PM By: Alric Quan Entered By: Alric Quan on 04/03/2018 16:09:18 Snowdon, Andrews L. ([id number]) -------------------------------------------------------------------------------- Encounter Discharge Information Details Patient Name: [name], Khallid L. Date of Service: 04/03/2018 9:45 AM Medical Record Number: [id number] Patient Account Number: [id number] Date of Birth/Sex: 01-22-1961 (57 y.o. Male) Treating RN: Roger Shelter Primary Care Navjot Pilgrim: [name] Other Clinician: Referring Tyler Robidoux: Blanchie Dessert Treating  Genowefa Morga/Extender: Melburn Hake, [name] Weeks in Treatment: 0 Encounter Discharge Information Items Discharge Condition: Stable Ambulatory Status: Ambulatory Discharge Destination: Home Transportation: Private Auto Schedule Follow-up Appointment: Yes Clinical Summary of Care: Electronic Signature(s) Signed: 04/03/2018 11:44:07 AM By: Roger Shelter Entered By: Roger Shelter on 04/03/2018 11:02:02 Krager, Lejon L. ([id number]) -------------------------------------------------------------------------------- Lower Extremity Assessment Details Patient Name: [name], [name] Date of Service: 04/03/2018 9:45 AM Medical Record Number: [id number] Patient Account Number: [id number] Date of Birth/Sex: [birth year] (57 y.o. Male) Treating RN: Montey Hora Primary Care Demaryius Imran: [name] Other Clinician: Referring Daven Pinckney: Blanchie Dessert Treating Kayvon Mo/Extender: Melburn Hake, [name] Weeks in Treatment: 0 Edema Assessment Assessed: [Left: No] [Right: No] Edema: [Left: Yes] [Right: Yes] Calf Left: Right: Point of Measurement: 36 cm From Medial Instep 47.6 cm 46.5 cm Ankle Left: Right: Point of Measurement: 12 cm From Medial Instep 28 cm 27.1 cm Vascular Assessment Pulses: Dorsalis Pedis Palpable: [Left:Yes] [Right:Yes] Doppler Audible: [Left:Yes] [Right:Yes] Posterior Tibial Palpable: [Left:Yes] [Right:Yes] Doppler Audible: [Left:Yes] [Right:Yes] Extremity colors, hair growth, and conditions: Extremity Color: [Left:Hyperpigmented] [Right:Hyperpigmented] Hair Growth on Extremity: [Left:No] [Right:No] Temperature of Extremity: [Left:Warm] [Right:Warm] Capillary Refill: [Left:< 3 seconds] [Right:< 3 seconds] Toe Nail Assessment Left: Right: Thick: Yes Yes Discolored: Yes Yes Deformed: No No Improper Length and Hygiene: Yes Yes Notes ABI [place] BILATERAL >220 Electronic Signature(s) Signed: 04/03/2018 3:48:56 PM By: Montey Hora Entered By: Montey Hora on 04/03/2018  10:34:22 Deandrade, Jhamir L. ([id number]) -------------------------------------------------------------------------------- Multi Wound Chart Details Patient Name: [name], Ravinder L. Date of Service: 04/03/2018 9:45 AM Medical Record Number: [id number] Patient Account Number: [id number] Date of Birth/Sex: 1961/06/19 (57 y.o. Male) Treating RN: Ahmed Prima Primary Care [name]: [name] Other Clinician: Referring Hayleigh Bawa: Blanchie Dessert Treating Eiza Canniff/Extender: [name], [name] Weeks in Treatment: 0 Vital Signs Height(in): 70 Pulse(bpm): 45 Weight(lbs): 302 Blood Pressure(mmHg): 136/65 Body Mass Index(BMI): 43 Temperature(F): 97.9 Respiratory Rate 18 (breaths/min): Photos: [1:No Photos] [2:No Photos] [N/A:N/A] Wound Location: [1:Right Lower Leg - Anterior] [2:Left Lower Leg - Anterior] [N/A:N/A] Wounding Event: [1:Gradually Appeared] [2:Gradually Appeared] [N/A:N/A] Primary Etiology: [1:Diabetic Wound/Ulcer of the Lower Extremity] [2:Diabetic Wound/Ulcer of  the Lower Extremity] [N/A:N/A] Secondary Etiology: [1:Lymphedema] [2:Lymphedema] [N/A:N/A] Comorbid History: [1:Congestive Heart Failure, Coronary Artery Disease, Deep Vein Thrombosis, Hypertension, Type II Diabetes, Osteoarthritis, Neuropathy] [2:Congestive Heart Failure, Coronary Artery Disease, Deep Vein Thrombosis, Hypertension, Type II  Diabetes, Osteoarthritis, Neuropathy] [N/A:N/A] Date Acquired: [1:03/20/2018] [2:03/20/2018] [N/A:N/A] Weeks of Treatment: [1:0] [2:0] [N/A:N/A] Wound Status: [1:Open] [2:Open] [N/A:N/A] Clustered Wound: [1:Yes] [2:Yes] [N/A:N/A] Clustered Quantity: [1:3] [2:4] [N/A:N/A] Measurements L x W x D [1:3x3x0.1] [2:4.5x2x0.1] [N/A:N/A] (cm) Area (cm) : [1:7.069] [2:7.069] [N/A:N/A] Volume (cm) : [1:0.707] [2:0.707] [N/A:N/A] Classification: [1:Grade 1] [2:Grade 1] [N/A:N/A] Exudate Amount: [1:Medium] [2:Medium] [N/A:N/A] Exudate Type: [1:Serous] [2:Serous] [N/A:N/A] Exudate  Color: [1:amber] [2:amber] [N/A:N/A] Wound Margin: [1:Flat and Intact] [2:Flat and Intact] [N/A:N/A] Granulation Amount: [1:Large (67-100%)] [2:Large (67-100%)] [N/A:N/A] Granulation Quality: [1:Pink] [2:Red] [N/A:N/A] Necrotic Amount: [1:None Present (0%)] [2:Small (1-33%)] [N/A:N/A] Exposed Structures: [1:Fascia: No Fat Layer (Subcutaneous Tissue) Exposed: No Tendon: No Muscle: No Joint: No Bone: No] [2:Fascia: No Fat Layer (Subcutaneous Tissue) Exposed: No Tendon: No Muscle: No Joint: No Bone: No] [N/A:N/A] Epithelialization: [1:Large (67-100%)] [2:Medium (34-66%)] [N/A:N/A] Periwound Skin Texture: Excoriation: No Excoriation: No N/A Induration: No Induration: No Callus: No Callus: No Crepitus: No Crepitus: No Rash: No Rash: No Scarring: No Scarring: No Periwound Skin Moisture: Maceration: No Maceration: No N/A Dry/Scaly: No Dry/Scaly: No Periwound Skin Color: Hemosiderin Staining: Yes Hemosiderin Staining: Yes N/A Atrophie Blanche: No Atrophie Blanche: No Cyanosis: No Cyanosis: No Ecchymosis: No Ecchymosis: No Erythema: No Erythema: No Mottled: No Mottled: No Pallor: No Pallor: No Rubor: No Rubor: No Temperature: No Abnormality No Abnormality N/A Tenderness on Palpation: Yes Yes N/A Wound Preparation: Ulcer Cleansing: Ulcer Cleansing: N/A Rinsed/Irrigated with Saline Rinsed/Irrigated with Saline Topical Anesthetic Applied: Topical Anesthetic Applied: None None Treatment Notes Electronic Signature(s) Signed: 04/03/2018 4:32:24 PM By: Alric Quan Entered By: Alric Quan on 04/03/2018 10:44:27 Radney, Prescott L. ([id number]) -------------------------------------------------------------------------------- [place] Details Patient Name: [name], [name] Date of Service: 04/03/2018 9:45 AM Medical Record Number: [id number] Patient Account Number: [id number] Date of Birth/Sex: June 28, 1961 (57 y.o. Male) Treating RN: Carolyne Fiscal,  Debi Primary Care Arben Packman: [name] Other Clinician: Referring Sigourney Portillo: Blanchie Dessert Treating Hollyn Stucky/Extender: Melburn Hake, [name] Weeks in Treatment: 0 Active Inactive ` Abuse / Safety / Falls / Self Care Management Nursing Diagnoses: Potential for falls Goals: Patient will not experience any injury related to falls Date Initiated: 04/03/2018 Target Resolution Date: 06/30/2018 Goal Status: Active Interventions: Assess Activities of Daily Living upon admission and as needed Assess fall risk on admission and as needed Assess: immobility, friction, shearing, incontinence upon admission and as needed Assess impairment of mobility on admission and as needed per policy Assess personal safety and home safety (as indicated) on admission and as needed Assess self care needs on admission and as needed Notes: ` Nutrition Nursing Diagnoses: Imbalanced nutrition Impaired glucose control: actual or potential Potential for alteratiion in Nutrition/Potential for imbalanced nutrition Goals: Patient/caregiver agrees to and verbalizes understanding of need to use nutritional supplements and/or vitamins as prescribed Date Initiated: 04/03/2018 Target Resolution Date: 07/28/2018 Goal Status: Active Interventions: Assess patient nutrition upon admission and as needed per policy Provide education on elevated blood sugars and impact on wound healing Provide education on nutrition Notes: ` Orientation to the Wound Care Program RAMEEN, QUINNEY ([id number]) Nursing Diagnoses: Knowledge deficit related to the wound healing center program Goals: Patient/caregiver will verbalize understanding of the [place] Date Initiated: 04/03/2018 Target Resolution Date: 04/28/2018 Goal Status: Active Interventions: Provide education on orientation to the wound center  Notes: ` Wound/Skin Impairment Nursing Diagnoses: Impaired tissue integrity Knowledge deficit related to  ulceration/compromised skin integrity Goals: Ulcer/skin breakdown will have a volume reduction of 80% by week 12 Date Initiated: 04/03/2018 Target Resolution Date: 07/21/2018 Goal Status: Active Interventions: Assess patient/caregiver ability to perform ulcer/skin care regimen upon admission and as needed Assess ulceration(s) every visit Notes: Electronic Signature(s) Signed: 04/03/2018 4:32:24 PM By: Alric Quan Entered By: Alric Quan on 04/03/2018 10:44:13 Kent, Talton L. ([id number]) -------------------------------------------------------------------------------- Pain Assessment Details Patient Name: [name], [name] Date of Service: 04/03/2018 9:45 AM Medical Record Number: [id number] Patient Account Number: [id number] Date of Birth/Sex: [birth year] (58 y.o. Male) Treating RN: Montey Hora Primary Care Clorissa Gruenberg: [name] Other Clinician: Referring Laray Corbit: Blanchie Dessert Treating Jolin Benavides/Extender: Melburn Hake, [name] Weeks in Treatment: 0 Active Problems Location of Pain Severity and Description of Pain Patient Has Paino Yes Site Locations Pain Location: Pain in Ulcers With Dressing Change: Yes Pain Management and Medication Current Pain Management: Electronic Signature(s) Signed: 04/03/2018 3:48:56 PM By: Montey Hora Entered By: Montey Hora on 04/03/2018 10:03:10 [name] ([id number]) -------------------------------------------------------------------------------- Patient/Caregiver Education Details Patient Name: [name], [name] Date of Service: 04/03/2018 9:45 AM Medical Record Number: [id number] Patient Account Number: [id number] Date of Birth/Gender: [birth year] (57 y.o. Male) Treating RN: Roger Shelter Primary Care Physician: [name] Other Clinician: Referring Physician: Blanchie Dessert Treating Physician/Extender: Sharalyn Ink in Treatment: 0 Education Assessment Education Provided To: Patient Education  Topics Provided Welcome To The [facility]: Handouts: Welcome To [facility] Methods: Explain/Verbal Responses: State content correctly Wound/Skin Impairment: Handouts: Caring for Your Ulcer Methods: Explain/Verbal Responses: State content correctly Electronic Signature(s) Signed: 04/03/2018 11:44:07 AM By: Roger Shelter Entered By: Roger Shelter on 04/03/2018 11:02:21 Eye, Jameel L. ([id number]) -------------------------------------------------------------------------------- Wound Assessment Details Patient Name: [name], [name] Date of Service: 04/03/2018 9:45 AM Medical Record Number: [id number] Patient Account Number: [id number] Date of Birth/Sex: Sep 22, 1961 (57 y.o. Male) Treating RN: Montey Hora Primary Care Johanny Segers: [name] Other Clinician: Referring Levonia Wolfley: Blanchie Dessert Treating Deshonda Cryderman/Extender: Melburn Hake, [name] Weeks in Treatment: 0 Wound Status Wound Number: 1 Primary Diabetic Wound/Ulcer of the Lower Extremity Etiology: Wound Location: Right Lower Leg - Anterior Secondary Lymphedema Wounding Event: Gradually Appeared Etiology: Date Acquired: 03/20/2018 Wound Open Weeks Of Treatment: 0 Status: Clustered Wound: Yes Comorbid Congestive Heart Failure, Coronary Artery History: Disease, Deep Vein Thrombosis, Hypertension, Type II Diabetes, Osteoarthritis, Neuropathy Photos Photo Uploaded By: Montey Hora on 04/03/2018 11:07:29 Wound Measurements Length: (cm) 3 % Redu Width: (cm) 3 % Redu Depth: (cm) 0.1 Epithe Clustered Quantity: 3 Tunnel Area: (cm) 7.069 Under Volume: (cm) 0.707 ction in Area: ction in Volume: lialization: Large (67-100%) ing: No mining: No Wound Description Classification: Grade 1 Wound Margin: Flat and Intact Exudate Amount: Medium Exudate Type: Serous Exudate Color: amber Foul Odor After Cleansing: No Slough/Fibrino No Wound Bed Granulation Amount: Large (67-100%) Exposed  Structure Granulation Quality: Pink Fascia Exposed: No Necrotic Amount: None Present (0%) Fat Layer (Subcutaneous Tissue) Exposed: No Tendon Exposed: No Muscle Exposed: No Joint Exposed: No Pierro, Phinneas L. ([id number]) Bone Exposed: No Periwound Skin Texture Texture Color No Abnormalities Noted: No No Abnormalities Noted: No Callus: No Atrophie Blanche: No Crepitus: No Cyanosis: No Excoriation: No Ecchymosis: No Induration: No Erythema: No Rash: No Hemosiderin Staining: Yes Scarring: No Mottled: No Pallor: No Moisture Rubor: No No Abnormalities Noted: No Dry / Scaly: No Temperature / Pain Maceration: No Temperature: No Abnormality Tenderness on Palpation: Yes Wound Preparation Ulcer Cleansing: Rinsed/Irrigated with Saline  Topical Anesthetic Applied: None Treatment Notes Wound #1 (Right, Anterior Lower Leg) 1. Cleansed with: Clean wound with Normal Saline 2. Anesthetic Topical Lidocaine 4% cream to wound bed prior to debridement 4. Dressing Applied: Other dressing (specify in notes) Notes kerlix and tubigrip biateral Electronic Signature(s) Signed: 04/03/2018 3:48:56 PM By: Montey Hora Entered By: Montey Hora on 04/03/2018 10:23:10 Purkey, Ewell L. ([id number]) -------------------------------------------------------------------------------- Wound Assessment Details Patient Name: [name], [name] Date of Service: 04/03/2018 9:45 AM Medical Record Number: [id number] Patient Account Number: [id number] Date of Birth/Sex: 08/02/1961 (57 y.o. Male) Treating RN: Montey Hora Primary Care Shaquina Gillham: [name] Other Clinician: Referring Gabriella Woodhead: Blanchie Dessert Treating Shaina Gullatt/Extender: Melburn Hake, [name] Weeks in Treatment: 0 Wound Status Wound Number: 2 Primary Diabetic Wound/Ulcer of the Lower Extremity Etiology: Wound Location: Left Lower Leg - Anterior Secondary Lymphedema Wounding Event: Gradually Appeared Etiology: Date Acquired:  03/20/2018 Wound Open Weeks Of Treatment: 0 Status: Clustered Wound: Yes Comorbid Congestive Heart Failure, Coronary Artery History: Disease, Deep Vein Thrombosis, Hypertension, Type II Diabetes, Osteoarthritis, Neuropathy Photos Photo Uploaded By: Montey Hora on 04/03/2018 11:07:29 Wound Measurements Length: (cm) 4.5 % Redu Width: (cm) 2 % Redu Depth: (cm) 0.1 Epithe Clustered Quantity: 4 Tunnel Area: (cm) 7.069 Under Volume: (cm) 0.707 ction in Area: ction in Volume: lialization: Medium (34-66%) ing: No mining: No Wound Description Classification: Grade 1 Wound Margin: Flat and Intact Exudate Amount: Medium Exudate Type: Serous Exudate Color: amber Foul Odor After Cleansing: No Slough/Fibrino Yes Wound Bed Granulation Amount: Large (67-100%) Exposed Structure Granulation Quality: Red Fascia Exposed: No Necrotic Amount: Small (1-33%) Fat Layer (Subcutaneous Tissue) Exposed: No Necrotic Quality: Adherent Slough Tendon Exposed: No Muscle Exposed: No Joint Exposed: No Hight, Ademola L. ([id number]) Bone Exposed: No Periwound Skin Texture Texture Color No Abnormalities Noted: No No Abnormalities Noted: No Callus: No Atrophie Blanche: No Crepitus: No Cyanosis: No Excoriation: No Ecchymosis: No Induration: No Erythema: No Rash: No Hemosiderin Staining: Yes Scarring: No Mottled: No Pallor: No Moisture Rubor: No No Abnormalities Noted: No Dry / Scaly: No Temperature / Pain Maceration: No Temperature: No Abnormality Tenderness on Palpation: Yes Wound Preparation Ulcer Cleansing: Rinsed/Irrigated with Saline Topical Anesthetic Applied: None Treatment Notes Wound #2 (Left, Anterior Lower Leg) 1. Cleansed with: Clean wound with Normal Saline 2. Anesthetic Topical Lidocaine 4% cream to wound bed prior to debridement 4. Dressing Applied: Other dressing (specify in notes) Notes kerlix and tubigrip biateral Electronic Signature(s) Signed:  04/03/2018 3:48:56 PM By: Montey Hora Entered By: Montey Hora on 04/03/2018 10:25:03 Patane, Dequavius L. ([id number]) -------------------------------------------------------------------------------- [place] Details Patient Name: [name], [name] Date of Service: 04/03/2018 9:45 AM Medical Record Number: [id number] Patient Account Number: [id number] Date of Birth/Sex: 1961-06-24 (57 y.o. Male) Treating RN: Montey Hora Primary Care Dawood Spitler: [name] Other Clinician: Referring Skylyn Slezak: Blanchie Dessert Treating Dondre Catalfamo/Extender: Melburn Hake, [name] Weeks in Treatment: 0 Vital Signs Time Taken: 10:14 Temperature (F): 97.9 Height (in): 70 Pulse (bpm): 85 Source: Measured Respiratory Rate (breaths/min): 18 Weight (lbs): 302 Blood Pressure (mmHg): 136/65 Source: Measured Reference Range: 80 - 120 mg / dl Body Mass Index (BMI): 43.3 Electronic Signature(s) Signed: 04/03/2018 3:48:56 PM By: Montey Hora Entered By: Montey Hora on 04/03/2018 10:14:39

## 2018-04-06 NOTE — Progress Notes (Addendum)
[name] ([id number]) Visit Report for 04/03/2018 Chief Complaint Document Details Patient Name: [name], [name]. Date of Service: 04/03/2018 9:45 AM Medical Record Number: [id number] Patient Account Number: [id number] Date of Birth/Sex: Apr 10, 1961 (57 y.o. Male) Treating RN: [name] Primary Care Provider: Windy Stein Other Clinician: Referring Provider: Blanchie Stein Treating Provider/Extender: Juan Hake, [name]: 0 Information Obtained from: Patient Chief Complaint Bilateral anterior lower leg ulcer Electronic Signature(s) Signed: 04/03/2018 5:32:46 PM By: Juan Keeler PA-C Entered By: [name] on 04/03/2018 16:32:17 [name], [name]. ([id number]) -------------------------------------------------------------------------------- HPI Details Patient Name: [name], [name]. Date of Service: 04/03/2018 9:45 AM Medical Record Number: [id number] Patient Account Number: [id number] Date of Birth/Sex: 07-Mar-1961 (57 y.o. Male) Treating RN: Carolyne Fiscal, Debi Primary Care Provider: Windy Stein Other Clinician: Referring Provider: Blanchie Stein Treating Provider/Extender: Juan Hake, [name]: 0 History of Present Illness HPI Description: 04/03/18 patient presents today for initial evaluation and our clinic as root referral from his emergency department after patient was seen on 03/25/18 due to bilateral leg swelling. He had blistering along with burning discomfort. According to the report patient does take Lasix daily as prescribed as well is a potassium supplement. He has not been wearing his compression stockings and states these are thigh-high and he does not like to wear them at all. Subsequently he has previously warned stockings on a regular basis but these are just too uncomfortable. He did not have any fever no evidence of cellulitis at that point in time. Subsequently they seem to be more focused on his potassium level  during the stay. Nonetheless the patient was discharged and told to call the wound care center for a follow-up appointment to evaluate his lower extremities. He does have evidence of lymphedema this changes including thickened/scaly skin over the bilateral lower extremities this does appear to be stage III lymphedema. Patient has had a vascular ultrasound for DVT on 12/07/17 and apparently has a repeat evaluation in July. However I did not find venous reflex testing as a part of any of the studies seen through epic. Subsequently his hemoglobin A-1 C was 8.9 as obtained on 11/09/17. Patient states that he has pain and seems to be more consistent with neuropathy but really no significant discomfort otherwise at this point. He does have a history of hypertension, DVT, lymphedema, congestive heart failure, and diabetes mellitus type II. Electronic Signature(s) Signed: 04/03/2018 5:32:46 PM By: Juan Keeler PA-C Entered By: [name] on 04/03/2018 16:41:06 [name] Kitchen ([id number]) -------------------------------------------------------------------------------- Physical Exam Details Patient Name: [name], [name]. Date of Service: 04/03/2018 9:45 AM Medical Record Number: [id number] Patient Account Number: [id number] Date of Birth/Sex: May 08, 1961 (57 y.o. Male) Treating RN: Carolyne Fiscal, Debi Primary Care Provider: Windy Stein Other Clinician: Referring Provider: Blanchie Stein Treating Provider/Extender: Juan III, [name]: 0 Constitutional patient is hypertensive.. pulse regular and within target range for patient.Marland Kitchen respirations regular, non-labored and within target range for patient.Marland Kitchen temperature within target range for patient.. Well-nourished and well-hydrated in no acute distress. Eyes conjunctiva clear no eyelid edema noted. pupils equal round and reactive to light and accommodation. Ears, Nose, Mouth, and Throat no gross abnormality of ear auricles or  external auditory canals. normal hearing noted during conversation. mucus membranes moist. Respiratory normal breathing without difficulty. clear to auscultation bilaterally. Cardiovascular regular rate and rhythm with normal S1, S2. 1+ dorsalis pedis/posterior tibialis pulses. no clubbing, cyanosis, significant edema, <3 sec cap refill. Gastrointestinal (GI) soft, non-tender, non-distended, +BS. no  ventral hernia noted. Musculoskeletal normal gait and posture. no significant deformity or arthritic changes, no loss or range of motion, no clubbing. Psychiatric this patient is able to make decisions and demonstrates good insight into disease process. Alert and Oriented x 3. pleasant and cooperative. Notes On inspection today patient's wounds appear to be rather superficial which is good news. He in fact seems to have scratched these off and in fact tells me that he was in the shower noted the dry skin and begin to scrub and pick off the areas causing several small wounds. I explained that this is something he definitely does not want to do as that can cause open wounds which can lead to subsequently him developing issues with infection which is something he definitely does not want. He understands. With that being said I told him that the most I would do as far as cleaning the legs would be dilated time record real soap on his hand gently rubbing the area but nothing more brazen than that. No debridement was required today. Electronic Signature(s) Signed: 04/03/2018 5:32:46 PM By: Juan Keeler PA-C Entered By: [name] on 04/03/2018 16:42:31 [name], [name] ([id number]) -------------------------------------------------------------------------------- Physician Orders Details Patient Name: [name], [name]. Date of Service: 04/03/2018 9:45 AM Medical Record Number: [id number] Patient Account Number: [id number] Date of Birth/Sex: Mar 14, 1961 (57 y.o. Male) Treating RN: Carolyne Fiscal,  Debi Primary Care Provider: Windy Stein Other Clinician: Referring Provider: Blanchie Stein Treating Provider/Extender: Juan Hake, [name]: 0 Verbal / Phone Orders: Yes Clinician: Pinkerton, Debi Read Back and Verified: Yes Diagnosis Coding Wound Cleansing Wound #1 Right,Anterior Lower Leg o Clean wound with Normal Saline. o Cleanse wound with mild soap and water o May Shower, gently pat wound dry prior to applying new dressing. - before the nurse changes dressings Wound #2 Left,Anterior Lower Leg o Clean wound with Normal Saline. o Cleanse wound with mild soap and water o May Shower, gently pat wound dry prior to applying new dressing. - before the nurse changes dressings Skin Barriers/Peri-Wound Care Wound #1 Right,Anterior Lower Leg o Triamcinolone Acetonide Ointment (TCA) Wound #2 Left,Anterior Lower Leg o Triamcinolone Acetonide Ointment (TCA) Primary Wound Dressing Wound #1 Right,Anterior Lower Leg o Silver Alginate Wound #2 Left,Anterior Lower Leg o Silver Alginate Secondary Dressing Wound #1 Right,Anterior Lower Leg o ABD pad o Conform/Kerlix Wound #2 Left,Anterior Lower Leg o ABD pad o Conform/Kerlix Dressing Change Frequency Wound #1 Right,Anterior Lower Leg o Change dressing every other day. Wound #2 Left,Anterior Lower Leg o Change dressing every other day. Follow-up Appointments Wound #1 Right,Anterior Lower Leg Face, Jmichael Stein. ([id number]) o Return Appointment in 1 week. Wound #2 Left,Anterior Lower Leg o Return Appointment in 1 week. Edema Control Wound #1 Right,Anterior Lower Leg o Elevate legs to the level of the heart and pump ankles as often as possible o Other: - tubigrip to bilateral legs Wound #2 Left,Anterior Lower Leg o Elevate legs to the level of the heart and pump ankles as often as possible o Other: - tubigrip to bilateral legs Additional Orders / Instructions Wound #1  Right,Anterior Lower Leg o Increase protein intake. Wound #2 Left,Anterior Lower Leg o Increase protein intake. Home Health Wound #1 [place] Visits o Home Health Nurse may visit PRN to address patientos wound care needs. o FACE TO FACE ENCOUNTER: MEDICARE and MEDICAID PATIENTS: I certify that this patient is under my care and that I had a face-to-face encounter that meets the  physician face-to-face encounter requirements with this patient on this date. The encounter with the patient was in whole or in part for the following MEDICAL CONDITION: (primary reason for [place]) MEDICAL NECESSITY: I certify, that based on my findings, NURSING services are a medically necessary home health service. HOME BOUND STATUS: I certify that my clinical findings support that this patient is homebound (i.e., Due to illness or injury, pt requires aid of supportive devices such as crutches, cane, wheelchairs, walkers, the use of special transportation or the assistance of another person to leave their place of residence. There is a normal inability to leave the home and doing so requires considerable and taxing effort. Other absences are for medical reasons / religious services and are infrequent or of short duration when for other reasons). o If current dressing causes regression in wound condition, may D/C ordered dressing product/s and apply Normal Saline Moist Dressing daily until next [place] / Other MD appointment. [place] of regression in wound condition at [phone]. o Please direct any NON-WOUND related issues/requests for orders to patient's Primary Care Physician Wound #2 [place] Nurse may visit PRN to address patientos wound care needs. o FACE TO FACE ENCOUNTER: MEDICARE and MEDICAID PATIENTS: I certify that this patient is under my  care and that I had a face-to-face encounter that meets the physician face-to-face encounter requirements with this patient on this date. The encounter with the patient was in whole or in part for the following MEDICAL CONDITION: (primary reason for [place]) MEDICAL NECESSITY: I certify, that based on my findings, NURSING services are a medically necessary home health service. HOME BOUND STATUS: I certify that my clinical findings support that this patient is homebound (i.e., Due to illness or injury, pt requires aid of supportive devices such as crutches, cane, wheelchairs, walkers, the use of special transportation or the assistance of another person to leave their place of residence. There is a normal inability to leave the home and doing so requires considerable and taxing effort. Other absences are for medical reasons / religious services and are infrequent or of short duration when for other reasons). o If current dressing causes regression in wound condition, may D/C ordered dressing product/s and apply Normal Saline Moist Dressing daily until next [place] / Other MD appointment. [place] of regression in wound condition at [phone]. ANANTH, FIALLOS ([id number]) o Please direct any NON-WOUND related issues/requests for orders to patient's Primary Care Physician Services and Therapies o Arterial Studies- Bilateral - [facility] o Venous Duplex Doppler - Bilateral - [facility] Electronic Signature(s) Signed: 04/05/2018 3:04:13 PM By: Alric Quan Signed: 04/08/2018 11:18:14 PM By: Juan Keeler PA-C Previous Signature: 04/03/2018 4:32:24 PM Version By: Alric Quan Previous Signature: 04/03/2018 5:32:46 PM Version By: Juan Keeler PA-C Entered By: Alric Quan on 04/05/2018 08:34:46 [name], [name]. ([id number]) -------------------------------------------------------------------------------- Problem List  Details Patient Name: [name], [name]. Date of Service: 04/03/2018 9:45 AM Medical Record Number: [id number] Patient Account Number: [id number] Date of Birth/Sex: 08-Nov-1960 (57 y.o. 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 ([id number]) -------------------------------------------------------------------------------- Progress Note Details Patient Name: [name], [name]. Date of Service: 04/03/2018 9:45 AM Medical Record Number: [id number] Patient Account Number: [id number] Date of Birth/Sex: 21-Jul-1961 (57 y.o. Male) Treating RN: [name] Primary Care Provider: Windy Stein Other Clinician: Referring Provider: Blanchie Stein Treating Provider/Extender: Juan Hake, [name]: 0 Subjective Chief Complaint Information obtained from Patient Bilateral anterior lower leg ulcer History of Present Illness (HPI) 04/03/18 patient presents today for initial evaluation and our  clinic as root referral from his emergency department after patient was seen on 03/25/18 due to bilateral leg swelling. He had blistering along with burning discomfort. According to the report patient does take Lasix daily as prescribed as well is a potassium supplement. He has not been wearing his compression stockings and states these are thigh-high and he does not like to wear them at all. Subsequently he has previously warned stockings on a regular basis but these are just too uncomfortable. He did not have any fever no evidence of cellulitis at that point in time. Subsequently they seem to be more focused on his potassium level during the stay. Nonetheless the patient was discharged and told to call the wound care center for a follow-up appointment to evaluate his lower extremities. He does have evidence of lymphedema this changes including thickened/scaly skin over the bilateral lower extremities this does appear to be stage III lymphedema. Patient has had a vascular ultrasound for DVT on 12/07/17 and apparently has a repeat evaluation in July. However I did not find venous reflex testing as a part of any of the studies seen through epic. Subsequently his hemoglobin A-1 C was 8.9 as obtained on 11/09/17. Patient states that he has pain and seems to be more consistent with neuropathy but really no significant discomfort otherwise at this point. He does have a history of hypertension, DVT, lymphedema, congestive heart failure, and diabetes mellitus type II. Wound History Patient reportedly has not tested positive for osteomyelitis. Patient reportedly has not had testing performed to evaluate circulation in the legs. Patient History Information obtained from Patient. Allergies ibuprofen, naproxen Family History Cancer - Father,Mother, Diabetes, Seizures - Mother, Stroke - Siblings, No family history of Heart Disease, Hereditary Spherocytosis, Hypertension, Kidney Disease, Lung Disease,  Thyroid Problems, Tuberculosis. Social History Never smoker, Marital Status - Single, Alcohol Use - Never, Drug Use - No History, Caffeine Use - Daily. Medical History Eyes Denies history of Cataracts, Glaucoma, Optic Neuritis Ear/Nose/Mouth/Throat Denies history of Chronic sinus problems/congestion, Middle ear problems Hematologic/Lymphatic Denies history of Anemia, Hemophilia, Human Immunodeficiency Virus, Lymphedema, Sickle Cell Disease [name], [name]. ([id number]) Respiratory Denies history of Aspiration, Asthma, Chronic Obstructive Pulmonary Disease (COPD), Pneumothorax, Sleep Apnea, Tuberculosis Cardiovascular Patient has history of Congestive Heart Failure, Coronary Artery Disease, Deep Vein Thrombosis, Hypertension Denies history of Angina, Arrhythmia, Hypotension, Myocardial Infarction, Peripheral Arterial Disease, Peripheral Venous Disease, Phlebitis, Vasculitis Gastrointestinal Denies history of Cirrhosis , Colitis, Crohn s, Hepatitis A, Hepatitis B, Hepatitis C Endocrine Patient has history of Type II Diabetes  Denies history of Type I Diabetes Genitourinary Denies history of End Stage Renal Disease Immunological Denies history of Lupus Erythematosus, Raynaud s, Scleroderma Integumentary (Skin) Denies history of History of Burn, History of pressure wounds Musculoskeletal Patient has history of Osteoarthritis Denies history of Gout, Rheumatoid Arthritis, Osteomyelitis Neurologic Patient has history of Neuropathy Denies history of Dementia, Quadriplegia, Paraplegia, Seizure Disorder Oncologic Denies history of Received Chemotherapy, Received Radiation Psychiatric Denies history of Anorexia/bulimia, Confinement Anxiety Patient is treated with Insulin, Oral Agents. 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 Eyes Denies complaints or symptoms of Dry Eyes, Vision Changes, Glasses / Contacts. Ear/Nose/Mouth/Throat Denies complaints or symptoms of Difficult clearing ears, Sinusitis. Hematologic/Lymphatic Denies complaints or symptoms of Bleeding / Clotting Disorders, Human Immunodeficiency Virus. Cardiovascular Complains or has symptoms of LE edema. Denies complaints or symptoms of Chest pain. Gastrointestinal Denies complaints or symptoms of Frequent diarrhea, Nausea, Vomiting. Endocrine Denies complaints or symptoms of Hepatitis, Thyroid disease, Polydypsia (Excessive Thirst). Genitourinary Denies complaints or symptoms of Kidney failure/ Dialysis, Incontinence/dribbling. Immunological Denies complaints or symptoms of Hives, Itching. Integumentary (Skin) Bisig, Darragh Stein. ([id number]) Complains or has symptoms of Wounds, Swelling. Denies complaints or symptoms of Bleeding or bruising tendency, Breakdown. Musculoskeletal Complains or has symptoms of Muscle Weakness. Denies complaints or symptoms of Muscle Pain. Neurologic Denies complaints or symptoms of Numbness/parasthesias, Focal/Weakness. Psychiatric Denies complaints or symptoms of Anxiety, Claustrophobia. Objective Constitutional patient is hypertensive.. pulse regular and within target range for patient.Marland Kitchen respirations regular, non-labored and within target range for patient.Marland Kitchen temperature within target range for patient.. Well-nourished and well-hydrated in no acute distress. Vitals Time Taken: 10:14 AM, Height: 70 in, Source: Measured, Weight: 302 lbs, Source: Measured, BMI: 43.3, Temperature: 97.9 F, Pulse: 85 bpm, Respiratory Rate: 18 breaths/min, Blood Pressure: 136/65 mmHg. Eyes conjunctiva clear no eyelid edema noted. pupils equal round and reactive to light and accommodation. Ears, Nose, Mouth, and Throat no gross abnormality of ear  auricles or external auditory canals. normal hearing noted during conversation. mucus membranes moist. Respiratory normal breathing without difficulty. clear to auscultation bilaterally. Cardiovascular regular rate and rhythm with normal S1, S2. 1+ dorsalis pedis/posterior tibialis pulses. no clubbing, cyanosis, significant edema, Gastrointestinal (GI) soft, non-tender, non-distended, +BS. no ventral hernia noted. Musculoskeletal normal gait and posture. no significant deformity or arthritic changes, no loss or range of motion, no clubbing. Psychiatric this patient is able to make decisions and demonstrates good insight into disease process. Alert and Oriented x 3. pleasant and cooperative. General Notes: On inspection today patient's wounds appear to be rather superficial which is good news. He in fact seems to have scratched these off and in fact tells me that he was in the shower noted the dry skin and begin to scrub and pick off the areas causing several small wounds. I explained that this is something he definitely does not want to do as that can cause open wounds which can lead to subsequently him developing issues with infection which is something he definitely does not want. He understands. With that being said I told him that the most I would do as far as cleaning the legs would be dilated time record real soap on his hand gently rubbing the area but nothing more brazen than that. No debridement was required today. [place], Melbert Stein. ([id number]) Integumentary (Hair, Skin) Wound #1 status is Open.  Original cause of wound was Gradually Appeared. The wound is located on the Right,Anterior Lower Leg. The wound measures 3cm length x 3cm width x 0.1cm depth; 7.069cm^2 area and 0.707cm^3 volume. There is no tunneling or undermining noted. There is a medium amount of serous drainage noted. The wound margin is flat and intact. There is large (67-100%) pink granulation within the wound bed.  There is no necrotic tissue within the wound bed. The periwound skin appearance exhibited: Hemosiderin Staining. The periwound skin appearance did not exhibit: Callus, Crepitus, Excoriation, Induration, Rash, Scarring, Dry/Scaly, Maceration, Atrophie Blanche, Cyanosis, Ecchymosis, Mottled, Pallor, Rubor, Erythema. Periwound temperature was noted as No Abnormality. The periwound has tenderness on palpation. Wound #2 status is Open. Original cause of wound was Gradually Appeared. The wound is located on the Left,Anterior Lower Leg. The wound measures 4.5cm length x 2cm width x 0.1cm depth; 7.069cm^2 area and 0.707cm^3 volume. There is no tunneling or undermining noted. There is a medium amount of serous drainage noted. The wound margin is flat and intact. There is large (67-100%) red granulation within the wound bed. There is a small (1-33%) amount of necrotic tissue within the wound bed including Adherent Slough. The periwound skin appearance exhibited: Hemosiderin Staining. The periwound skin appearance did not exhibit: Callus, Crepitus, Excoriation, Induration, Rash, Scarring, Dry/Scaly, Maceration, Atrophie Blanche, Cyanosis, Ecchymosis, Mottled, Pallor, Rubor, Erythema. Periwound temperature was noted as No Abnormality. The periwound has tenderness on palpation. 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 Cleanse wound with mild soap and water May Shower, gently pat wound dry prior to applying new dressing. -  before the nurse changes dressings Wound #2 Left,Anterior Lower Leg: Clean wound with Normal Saline. Cleanse wound with mild soap and water May Shower, gently pat wound dry prior to applying new dressing. - before the nurse changes dressings Skin Barriers/Peri-Wound Care: Wound #1 Right,Anterior Lower Leg: Triamcinolone Acetonide Ointment (TCA) Wound #2 Left,Anterior Lower Leg: Triamcinolone Acetonide Ointment (TCA) Primary Wound Dressing: Wound #1 Right,Anterior Lower Leg: [name], [name]. ([id number]) Silver Alginate Wound #2 Left,Anterior Lower Leg: Silver Alginate Secondary Dressing: Wound #1 Right,Anterior Lower Leg: ABD pad Conform/Kerlix Wound #2 Left,Anterior Lower Leg: ABD pad Conform/Kerlix Dressing Change Frequency: Wound #1 Right,Anterior Lower Leg: Change dressing every other day. Wound #2 Left,Anterior Lower Leg: Change dressing every other day. Follow-up Appointments: Wound #1 Right,Anterior Lower Leg: Return Appointment in 1 week. Wound #2 Left,Anterior Lower Leg: Return Appointment in 1 week. Edema Control: Wound #1 Right,Anterior Lower Leg: Elevate legs to the level of the heart and pump ankles as often as possible Other: - tubigrip to bilateral legs Wound #2 Left,Anterior Lower Leg: Elevate legs to the level of the heart and pump ankles as often as possible Other: - tubigrip to bilateral legs Additional Orders / Instructions: Wound #1 Right,Anterior Lower Leg: Increase protein intake. Wound #2 Left,Anterior Lower Leg: Increase protein intake. Home Health: Wound #1 Right,Anterior Lower Leg: Continue Home Health Visits - [facility] Nurse may visit PRN to address patient s wound care needs. FACE TO FACE ENCOUNTER: MEDICARE and MEDICAID PATIENTS: I certify that this patient is  under my care and that I had a face-to-face encounter that meets the physician face-to-face encounter requirements with this patient on this date. The encounter with  the patient was in whole or in part for the following MEDICAL CONDITION: (primary reason for [place]) MEDICAL NECESSITY: I certify, that based on my findings, NURSING services are a medically necessary home health service. HOME BOUND STATUS: I certify that my clinical findings support that this patient is homebound (i.e., Due to illness or injury, pt requires aid of supportive devices such as crutches, cane, wheelchairs, walkers, the use of special transportation or the assistance of another person to leave their place of residence. There is a normal inability to leave the home and doing so requires considerable and taxing effort. Other absences are for medical reasons / religious services and are infrequent or of short duration when for other reasons). If current dressing causes regression in wound condition, may D/C ordered dressing product/s and apply Normal Saline Moist Dressing daily until next [place] / Other MD appointment. [place] of regression in wound condition at [phone]. Please direct any NON-WOUND related issues/requests for orders to patient's Primary Care Physician Wound #2 Left,Anterior Lower Leg: [place] Visits - [facility] Nurse may visit PRN to address patient s wound care needs. FACE TO FACE ENCOUNTER: MEDICARE and MEDICAID PATIENTS: I certify that this patient is under my care and that I had a face-to-face encounter that meets the physician face-to-face encounter requirements with this patient on this date. The encounter with the patient was in whole or in part for the following MEDICAL CONDITION: (primary reason for [place]) MEDICAL NECESSITY: I certify, that based on my findings, NURSING services are a medically necessary home health service. HOME BOUND STATUS: I certify that my clinical findings support that this patient is homebound (i.e., Due to illness or injury, pt requires aid of supportive  devices such as crutches, cane, wheelchairs, walkers, the use of special transportation or the assistance of another person to leave their place of residence. There is a normal inability to leave the home and doing so requires considerable and taxing effort. Other absences are for medical reasons / religious services and NICANDRO, PERRAULT. ([id number]) are infrequent or of short duration when for other reasons). If current dressing causes regression in wound condition, may D/C ordered dressing product/s and apply Normal Saline Moist Dressing daily until next [place] / Other MD appointment. [place] of regression in wound condition at [phone]. Please direct any NON-WOUND related issues/requests for orders to patient's Primary Care Physician Services and Therapies ordered were: Arterial Studies- Bilateral - [facility], Venous Duplex Doppler - Bilateral - [place] [facility] At this point in time I am definitely concerned about the fact that the patient does seem to have chronic lymphedema and I think this is something that needs to be addressed. With that being said before initiating compression wrapping I would like them to have an arterial study to further evaluate his blood flow. In the interim we will use Tubigrip in order to help with some compression. Patient is in agreement with this plan. We will see were things stand at follow-up in one weeks time. Please see above for specific wound care orders. We will see patient for re-evaluation in 1 week(s) here in the clinic. If anything worsens or changes patient will contact our office for additional recommendations. Electronic Signature(s) Signed: 04/03/2018 5:32:46 PM By: Juan Keeler PA-C  Entered By: [name] on 04/03/2018 16:43:36 Gotwalt, Trusten Carlean Jews ([id number]) -------------------------------------------------------------------------------- ROS/PFSH Details Patient Name: [name], [name]. Date  of Service: 04/03/2018 9:45 AM Medical Record Number: [id number] Patient Account Number: [id number] Date of Birth/Sex: 04-19-1961 (57 y.o. Male) Treating RN: Montey Hora Primary Care Provider: Windy Stein Other Clinician: Referring Provider: Blanchie Stein Treating Provider/Extender: Juan Hake, [name]: 0 Information Obtained From Patient Wound History Do you currently have one or more open woundso Yes Approximately how long have you had your woundso 2 weeks How have you been treating your wound(s) until nowo nurse is wrapping Has your wound(s) ever healed and then re-openedo No Have you had any lab work done in the past montho Yes Who ordered the lab work doneo [facility] Have you tested positive for an antibiotic resistant organism (MRSA, VRE)o No Have you tested positive for osteomyelitis (bone infection)o No Have you had any tests for circulation on your legso No Constitutional Symptoms (General Health) Complaints and Symptoms: Negative for: Fatigue; Fever; Chills; Marked Weight Change Eyes Complaints and Symptoms: Negative for: Dry Eyes; Vision Changes; Glasses / Contacts Medical History: Negative for: Cataracts; Glaucoma; Optic Neuritis Ear/Nose/Mouth/Throat Complaints and Symptoms: Negative for: Difficult clearing ears; Sinusitis Medical History: Negative for: Chronic sinus problems/congestion; Middle ear problems Hematologic/Lymphatic Complaints and Symptoms: Negative for: Bleeding / Clotting Disorders; Human Immunodeficiency Virus Medical History: Negative for: Anemia; Hemophilia; Human Immunodeficiency Virus; Lymphedema; Sickle Cell Disease Cardiovascular Complaints and Symptoms: Positive for: LE edema Negative for: Chest pain Medical History: Positive for: Congestive Heart Failure; Coronary Artery Disease; Deep Vein Thrombosis; Hypertension Sartin, Rollan Stein. ([id number]) Negative for: Angina; Arrhythmia; Hypotension; Myocardial Infarction;  Peripheral Arterial Disease; Peripheral Venous Disease; Phlebitis; Vasculitis Gastrointestinal Complaints and Symptoms: Negative for: Frequent diarrhea; Nausea; Vomiting Medical History: Negative for: Cirrhosis ; Colitis; Crohnos; Hepatitis A; Hepatitis B; Hepatitis C Past Medical History Notes: PUD, IBS Endocrine Complaints and Symptoms: Negative for: Hepatitis; Thyroid disease; Polydypsia (Excessive Thirst) Medical History: Positive for: Type II Diabetes Negative for: Type I Diabetes Treated with: Insulin, Oral agents Genitourinary Complaints and Symptoms: Negative for: Kidney failure/ Dialysis; Incontinence/dribbling Medical History: Negative for: End Stage Renal Disease Past Medical History Notes: kidney disease in hospital Immunological Complaints and Symptoms: Negative for: Hives; Itching Medical History: Negative for: Lupus Erythematosus; Raynaudos; Scleroderma Integumentary (Skin) Complaints and Symptoms: Positive for: Wounds; Swelling Negative for: Bleeding or bruising tendency; Breakdown Medical History: Negative for: History of Burn; History of pressure wounds Musculoskeletal Complaints and Symptoms: Positive for: Muscle Weakness Negative for: Muscle Pain Medical History: Positive for: Osteoarthritis Deboard, Saquan Stein. ([id number]) Negative for: Gout; Rheumatoid Arthritis; Osteomyelitis Neurologic Complaints and Symptoms: Negative for: Numbness/parasthesias; Focal/Weakness Medical History: Positive for: Neuropathy Negative for: Dementia; Quadriplegia; Paraplegia; Seizure Disorder Past Medical History Notes: history of CVA Psychiatric Complaints and Symptoms: Negative for: Anxiety; Claustrophobia Medical History: Negative for: Anorexia/bulimia; Confinement Anxiety Respiratory Medical History: Negative for: Aspiration; Asthma; Chronic Obstructive Pulmonary Disease (COPD); Pneumothorax; Sleep Apnea; Tuberculosis Oncologic Medical History: Negative  for: Received Chemotherapy; Received Radiation Immunizations Pneumococcal Vaccine: Received Pneumococcal Vaccination: No Implantable Devices Family and Social History Cancer: Yes - Father,Mother; Diabetes: Yes; Heart Disease: No; Hereditary Spherocytosis: No; Hypertension: No; Kidney Disease: No; Lung Disease: No; Seizures: Yes - Mother; Stroke: Yes - Siblings; Thyroid Problems: No; Tuberculosis: No; Never smoker; Marital Status - Single; Alcohol Use: Never; Drug Use: No History; Caffeine Use: Daily; Financial Concerns: No; Food, Clothing or Shelter Needs: No; Support System Lacking: No; Transportation Concerns: No; Advanced Directives: No; Patient does not want  information on Administrator) Signed: 04/03/2018 3:48:56 PM By: Montey Hora Signed: 04/03/2018 5:32:46 PM By: Juan Keeler PA-C Entered By: Montey Hora on 04/03/2018 10:13:56 Cygan, Andrae Stein. ([id number]) -------------------------------------------------------------------------------- Export Details Patient Name: [name], Makari Stein. Date of Service: 04/03/2018 Medical Record Number: [id number] Patient Account Number: [id number] Date of Birth/Sex: [birth year] (57 y.o. 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.821 Non-pressure chronic ulcer of other part of left lower leg limited to breakdown of skin N16.57 Chronic diastolic (congestive) heart failure E11.40 Type 2 diabetes mellitus with diabetic neuropathy, unspecified I10 Essential (primary) hypertension Facility Procedures CPT4 Code:  [id number] Description: 32919 - WOUND CARE VISIT-LEV 5 EST PT Modifier: Quantity: 1 Physician Procedures CPT4 Code Description: [id number] - WC PHYS LEVEL 4 - NEW PT ICD-10 Diagnosis Description E11.622 Type 2 diabetes mellitus with other skin ulcer I89.0 Lymphedema, not elsewhere classified L97.811 Non-pressure chronic ulcer of other part of right lower  leg lim L97.821 Non-pressure chronic ulcer of other part of left lower leg limi Modifier: ited to breakdown ted to breakdown Quantity: 1 of skin of skin Electronic Signature(s) Signed: 04/03/2018 5:32:46 PM By: Juan Keeler PA-C Previous Signature: 04/03/2018 4:09:24 PM Version By: Alric Quan Entered By: [name] on 04/03/2018 16:44:03

## 2018-04-10 ENCOUNTER — Encounter: Payer: Medicaid Other | Admitting: Physician Assistant

## 2018-04-10 DIAGNOSIS — E11622 Type 2 diabetes mellitus with other skin ulcer: Secondary | ICD-10-CM | POA: Diagnosis not present

## 2018-04-11 ENCOUNTER — Other Ambulatory Visit: Payer: Self-pay | Admitting: Physician Assistant

## 2018-04-11 DIAGNOSIS — M79606 Pain in leg, unspecified: Secondary | ICD-10-CM

## 2018-04-12 NOTE — Progress Notes (Signed)
JERONE, CUDMORE ([id number]) Visit Report for 04/10/2018 Arrival Information Details Patient Name: CHOICE, KLEINSASSER. Date of Service: 04/10/2018 2:15 PM Medical Record Number: [id number] Patient Account Number: [id number] Date of Birth/Sex: 03/15/1961 (57 y.o. M) Treating RN: Montey Hora Primary Care Jesseka Drinkard: [name] Other Clinician: Referring Teneil Shiller: [name] Treating Marquia Costello/Extender: Melburn Hake, [name] Weeks in Treatment: 1 Visit Information History Since Last Visit Added or deleted any medications: No Patient Arrived: Ambulatory Any new allergies or adverse reactions: No Arrival Time: 14:53 Had a fall or experienced change in No Accompanied By: self activities of daily living that may affect Transfer Assistance: None risk of falls: Patient Identification Verified: Yes Signs or symptoms of abuse/neglect since last visito No Secondary Verification Process Yes Hospitalized since last visit: No Completed: Implantable device outside of the clinic excluding No Patient Has Alerts: Yes cellular tissue based products placed in the center Patient Alerts: DMII since last visit: ABI  BILATERAL Has Dressing in Place as Prescribed: Yes >220 Has Compression in Place as Prescribed: Yes Pain Present Now: No Electronic Signature(s) Signed: 04/10/2018 3:48:21 PM By: Montey Hora Entered By: Montey Hora on 04/10/2018 14:54:41 Sherrer, Therman L. ([id number]) -------------------------------------------------------------------------------- Clinic Level of Care Assessment Details Patient Name: [name], Macintyre L. Date of Service: 04/10/2018 2:15 PM Medical Record Number: [id number] Patient Account Number: [id number] Date of Birth/Sex: Feb 15, 1961 (57 y.o. M) Treating RN: Ahmed Prima Primary Care Milia Warth: [name] Other Clinician: Referring Tema Alire: [name] Treating Abubakr Wieman/Extender: Melburn Hake, [name] Weeks in Treatment: 1 Clinic Level of Care Assessment  Items TOOL 4 Quantity Score X - Use when only an EandM is performed on FOLLOW-UP visit 1 0 ASSESSMENTS - Nursing Assessment / Reassessment []  - Reassessment of Co-morbidities (includes updates in patient status) 0 []  - 0 Reassessment of Adherence to Treatment Plan ASSESSMENTS - Wound and Skin Assessment / Reassessment []  - Simple Wound Assessment / Reassessment - one wound 0 []  - 0 Complex Wound Assessment / Reassessment - multiple wounds []  - 0 Dermatologic / Skin Assessment (not related to wound area) ASSESSMENTS - Focused Assessment []  - Circumferential Edema Measurements - multi extremities 0 []  - 0 Nutritional Assessment / Counseling / Intervention []  - 0 Lower Extremity Assessment (monofilament, tuning fork, pulses) []  - 0 Peripheral Arterial Disease Assessment (using hand held doppler) ASSESSMENTS - Ostomy and/or Continence Assessment and Care []  - Incontinence Assessment and Management 0 []  - 0 Ostomy Care Assessment and Management (repouching, etc.) PROCESS - Coordination of Care []  - Simple Patient / Family Education for ongoing care 0 X- 1 20 Complex (extensive) Patient / Family Education for ongoing care X- 1 10 Staff obtains Programmer, systems, Records, Test Results / Process Orders X- 1 10 Staff telephones HHA, Nursing Homes / Clarify orders / etc []  - 0 Routine Transfer to another Facility (non-emergent condition) []  - 0 Routine Hospital Admission (non-emergent condition) []  - 0 New Admissions / Biomedical engineer / Ordering NPWT, Apligraf, etc. []  - 0 Emergency Hospital Admission (emergent condition) X- 1 10 Simple Discharge Coordination Faiella, Danilo L. ([id number]) []  - 0 Complex (extensive) Discharge Coordination PROCESS - Special Needs []  - Pediatric / Minor Patient Management 0 []  - 0 Isolation Patient Management []  - 0 Hearing / Language / Visual special needs []  - 0 Assessment of Community assistance (transportation, D/C planning, etc.) []  -  0 Additional assistance / Altered mentation []  - 0 Support Surface(s) Assessment (bed, cushion, seat, etc.) INTERVENTIONS - Wound Cleansing / Measurement []  - Simple Wound Cleansing - one wound  0 []  - 0 Complex Wound Cleansing - multiple wounds []  - 0 Wound Imaging (photographs - any number of wounds) []  - 0 Wound Tracing (instead of photographs) []  - 0 Simple Wound Measurement - one wound []  - 0 Complex Wound Measurement - multiple wounds INTERVENTIONS - Wound Dressings []  - Small Wound Dressing one or multiple wounds 0 []  - 0 Medium Wound Dressing one or multiple wounds []  - 0 Large Wound Dressing one or multiple wounds []  - 0 Application of Medications - topical []  - 0 Application of Medications - injection INTERVENTIONS - Miscellaneous []  - External ear exam 0 []  - 0 Specimen Collection (cultures, biopsies, blood, body fluids, etc.) []  - 0 Specimen(s) / Culture(s) sent or taken to Lab for analysis []  - 0 Patient Transfer (multiple staff / Civil Service fast streamer / Similar devices) []  - 0 Simple Staple / Suture removal (25 or less) []  - 0 Complex Staple / Suture removal (26 or more) []  - 0 Hypo / Hyperglycemic Management (close monitor of Blood Glucose) []  - 0 Ankle / Brachial Index (ABI) - do not check if billed separately X- 1 5 Vital Signs Beegle, Cache L. ([id number]) Has the patient been seen at the hospital within the last three years: Yes Total Score: 55 Level Of Care: New/Established - Level 2 Electronic Signature(s) Signed: 04/10/2018 4:21:54 PM By: Alric Quan Entered By: Alric Quan on 04/10/2018 16:19:03 Savary, Lesley L. ([id number]) -------------------------------------------------------------------------------- Encounter Discharge Information Details Patient Name: [name], Xavius L. Date of Service: 04/10/2018 2:15 PM Medical Record Number: [id number] Patient Account Number: [id number] Date of Birth/Sex: 1961-09-08 (57 y.o. M) Treating RN:  Roger Shelter Primary Care Brinson Tozzi: [name] Other Clinician: Referring Devra Stare: [name] Treating Rhyli Depaula/Extender: Melburn Hake, [name] Weeks in Treatment: 1 Encounter Discharge Information Items Discharge Condition: Stable Ambulatory Status: Cane Discharge Destination: Home Transportation: Private Auto Schedule Follow-up Appointment: Yes Clinical Summary of Care: Electronic Signature(s) Signed: 04/10/2018 4:08:56 PM By: Roger Shelter Entered By: Roger Shelter on 04/10/2018 15:21:31 Greff, Aubrey L. ([id number]) -------------------------------------------------------------------------------- Lower Extremity Assessment Details Patient Name: [name], Shonta L. Date of Service: 04/10/2018 2:15 PM Medical Record Number: [id number] Patient Account Number: [id number] Date of Birth/Sex: Jan 11, 1961 (57 y.o. M) Treating RN: Montey Hora Primary Care Jeremaih Klima: [name] Other Clinician: Referring Evey Mcmahan: [name] Treating Avah Bashor/Extender: Melburn Hake, [name] Weeks in Treatment: 1 Edema Assessment Assessed: [Left: No] [Right: No] [Left: Edema] [Right: :] Calf Left: Right: Point of Measurement: 36 cm From Medial Instep 47.7 cm 45.7 cm Ankle Left: Right: Point of Measurement: 12 cm From Medial Instep 28.3 cm 27.5 cm Vascular Assessment Pulses: Dorsalis Pedis Palpable: [Left:Yes] [Right:Yes] Posterior Tibial Extremity colors, hair growth, and conditions: Extremity Color: [Left:Hyperpigmented] [Right:Hyperpigmented] Hair Growth on Extremity: [Left:No] [Right:No] Temperature of Extremity: [Left:Warm] [Right:Warm] Capillary Refill: [Left:< 3 seconds] [Right:< 3 seconds] Toe Nail Assessment Left: Right: Thick: Yes Yes Discolored: Yes Yes Deformed: No No Improper Length and Hygiene: Yes Yes Electronic Signature(s) Signed: 04/10/2018 3:48:21 PM By: Montey Hora Entered By: Montey Hora on 04/10/2018 15:01:18 Delbridge, Jayshon L.  ([id number]) -------------------------------------------------------------------------------- Multi Wound Chart Details Patient Name: [name], Khalid L. Date of Service: 04/10/2018 2:15 PM Medical Record Number: [id number] Patient Account Number: [id number] Date of Birth/Sex: Mar 21, 1961 (57 y.o. 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 Extremity Secondary Etiology: Lymphedema Lymphedema N/A Date Acquired: 03/20/2018 03/20/2018 N/A Weeks of Treatment: 1 1 N/A Wound Status: Open Open N/A Clustered Wound: Yes Yes N/A Measurements L x W x D 0.1x0.1x0.1 0.1x0.1x0.1 N/A (cm) Area (cm) : 0.008 0.008 N/A Volume (cm) : 0.001 0.001 N/A % Reduction in Area: 99.90% 99.90% N/A % Reduction in Volume: 99.90% 99.90% N/A Classification: Grade 1 Grade 1 N/A Periwound Skin Texture: No Abnormalities Noted No Abnormalities Noted N/A Periwound Skin Moisture: No Abnormalities Noted No Abnormalities Noted N/A Periwound Skin Color: No Abnormalities Noted No Abnormalities Noted N/A Tenderness on Palpation: No No N/A Treatment Notes Electronic Signature(s) Signed: 04/10/2018 4:21:54 PM By: Alric Quan Entered By: Alric Quan on 04/10/2018 15:10:49 Plante, Jovany L. ([id number]) Zumbro, Dossie L. ([id number]) -------------------------------------------------------------------------------- [place] Details Patient  Name: [name], Tobyn L. Date of Service: 04/10/2018 2:15 PM Medical Record Number: [id number] Patient Account Number: [id number] Date of Birth/Sex: 1961-09-09 (57 y.o. M) Treating RN: Ahmed Prima Primary Care Shakora Nordquist: [name] Other Clinician: Referring Mercedees Convery: [name] Treating Ledia Hanford/Extender: Melburn Hake, [name] Weeks in Treatment: 1 Active Inactive ` Abuse / Safety / Falls / Self Care Management Nursing Diagnoses: Potential for falls Goals: Patient will not experience any injury related to falls Date Initiated: 04/03/2018 Target Resolution Date: 06/30/2018 Goal Status: Active Interventions: Assess Activities of Daily Living upon admission and as needed Assess fall risk on admission and as needed Assess: immobility, friction, shearing, incontinence upon admission and as needed Assess impairment of mobility on admission and as needed per policy Assess personal safety and home safety (as indicated) on admission and as needed Assess self care needs on admission and as needed Notes: ` Nutrition Nursing Diagnoses: Imbalanced nutrition Impaired glucose control: actual or potential Potential for alteratiion in Nutrition/Potential for imbalanced nutrition Goals: Patient/caregiver agrees to and verbalizes understanding of need to use nutritional supplements and/or vitamins as prescribed Date Initiated: 04/03/2018 Target Resolution Date: 07/28/2018 Goal Status: Active Interventions: Assess patient nutrition upon admission and as needed per policy Provide education on elevated blood sugars and impact on wound healing Provide education on nutrition Notes: ` Orientation to the Wound Care Program KEITHON, MCCOIN ([id number]) Nursing Diagnoses: Knowledge deficit related to the wound healing center program Goals: Patient/caregiver will verbalize understanding of the [facility] Date Initiated: 04/03/2018 Target Resolution Date: 04/28/2018 Goal Status:  Active Interventions: Provide education on orientation to the wound center Notes: ` Wound/Skin Impairment Nursing Diagnoses: Impaired tissue integrity Knowledge deficit related to ulceration/compromised skin integrity Goals: Ulcer/skin breakdown will have a volume reduction of 80% by week 12 Date Initiated: 04/03/2018 Target Resolution Date: 07/21/2018 Goal Status: Active Interventions: Assess patient/caregiver ability to perform ulcer/skin care regimen upon admission and as needed Assess ulceration(s) every visit Notes: Electronic Signature(s) Signed: 04/10/2018 4:21:54 PM By: Alric Quan Entered By: Alric Quan on 04/10/2018 15:10:37 Kolbeck, Jaquail L. ([id number]) -------------------------------------------------------------------------------- Pain Assessment Details Patient Name: [name], Jerzy L. Date of Service: 04/10/2018 2:15 PM Medical Record Number: [id number] Patient Account Number: [id number] Date of Birth/Sex: 09/16/1961 (57 y.o. M) Treating RN: Montey Hora Primary Care Dellene Mcgroarty: [name] Other Clinician: Referring Victorya Hillman: [name] Treating Harshal Sirmon/Extender: Melburn Hake, [name] Weeks in Treatment: 1 Active Problems Location of Pain Severity and Description of Pain Patient Has Paino Yes Site Locations Pain Location: Pain in Ulcers With Dressing  Change: No Duration of the Pain. Constant / Intermittento Intermittent Character of Pain Describe the Pain: Burning Pain Management and Medication Current Pain Management: Electronic Signature(s) Signed: 04/10/2018 3:48:21 PM By: Montey Hora Entered By: Montey Hora on 04/10/2018 14:54:53 Moorman, Maicol L. ([id number]) -------------------------------------------------------------------------------- Patient/Caregiver Education Details Patient Name: [name], Nakota L. Date of Service: 04/10/2018 2:15 PM Medical Record Number: [id number] Patient Account Number: [id number] Date of Birth/Gender:  04/03/1961 (57 y.o. M) Treating RN: Roger Shelter Primary Care Physician: [name] Other Clinician: Referring Physician: [name] Treating Physician/Extender: Sharalyn Ink in Treatment: 1 Education Assessment Education Provided To: Patient Education Topics Provided Wound/Skin Impairment: Handouts: Skin Care Do's and Dont's Methods: Explain/Verbal Responses: State content correctly Electronic Signature(s) Signed: 04/10/2018 4:08:56 PM By: Roger Shelter Entered By: Roger Shelter on 04/10/2018 15:21:44 [place]uk, Akshay L. ([id number]) -------------------------------------------------------------------------------- Wound Assessment Details Patient Name: [name], Benjaman L. Date of Service: 04/10/2018 2:15 PM Medical Record Number: [id number] Patient Account Number: [id number] Date of Birth/Sex: Apr 28, 1961 (57 y.o. M) Treating RN: Ahmed Prima Primary Care Shaundrea Carrigg: [name] Other Clinician: Referring Lawsen Arnott: [name] Treating Jaydien Panepinto/Extender: [name], [name] Weeks in Treatment: 1 Wound Status Wound Number: 1 Primary Diabetic Wound/Ulcer of the Lower Extremity Etiology: Wound Location: Right Lower Leg - Anterior Secondary Lymphedema Wounding Event: Gradually Appeared Etiology: Date Acquired: 03/20/2018 Wound Healed - Epithelialized Weeks Of Treatment: 1 Status: Clustered Wound: Yes Comorbid Congestive Heart Failure, Coronary Artery History: Disease, Deep Vein Thrombosis, Hypertension, Type II Diabetes, Osteoarthritis, Neuropathy Photos Wound Measurements Length: (cm) 0 % Reductio Width: (cm) 0 % Reductio Depth: (cm) 0 Epithelial Clustered Quantity: 3 Area: (cm) 0 Volume: (cm) 0 n in Area: 100% n in Volume: 100% ization: Large (67-100%) Wound Description Classification: Grade 1 Foul Odor Wound Margin: Flat and Intact Slough/Fib Exudate Amount: Medium Exudate Type: Serous Exudate Color: amber After Cleansing: No rino  No Wound Bed Granulation Amount: Large (67-100%) Exposed Structure Granulation Quality: Pink Fascia Exposed: No Necrotic Amount: None Present (0%) Fat Layer (Subcutaneous Tissue) Exposed: No Tendon Exposed: No Muscle Exposed: No Joint Exposed: No Bone Exposed: No Angelucci, Nobel L. ([id number]) Periwound Skin Texture Texture Color No Abnormalities Noted: No No Abnormalities Noted: No Callus: No Atrophie Blanche: No Crepitus: No Cyanosis: No Excoriation: No Ecchymosis: No Induration: No Erythema: No Rash: No Hemosiderin Staining: Yes Scarring: No Mottled: No Pallor: No Moisture Rubor: No No Abnormalities Noted: No Dry / Scaly: No Temperature / Pain Maceration: No Temperature: No Abnormality Tenderness on Palpation: Yes Wound Preparation Ulcer Cleansing: Rinsed/Irrigated with Saline Topical Anesthetic Applied: None Electronic Signature(s) Signed: 04/10/2018 4:21:54 PM By: Alric Quan Entered By: Alric Quan on 04/10/2018 15:14:18 Dunson, Nichael L. ([id number]) -------------------------------------------------------------------------------- Wound Assessment Details Patient Name: [name], Abhiraj L. Date of Service: 04/10/2018 2:15 PM Medical Record Number: [id number] Patient Account Number: [id number] Date of Birth/Sex: April 21, 1961 (57 y.o. M) Treating RN: Ahmed Prima Primary Care Brittain Smithey: [name] Other Clinician: Referring Meri Pelot: [name] Treating Rosali Augello/Extender: [name], [name] Weeks in Treatment: 1 Wound Status Wound Number: 2 Primary Etiology: Diabetic Wound/Ulcer of the Lower Extremity Wound Location: Left, Anterior Lower Leg Secondary Lymphedema Wounding Event: Gradually Appeared Etiology: Date Acquired: 03/20/2018 Wound Status: Healed - Epithelialized Weeks Of Treatment: 1 Clustered Wound: Yes Photos Photo Uploaded By: Montey Hora on 04/10/2018 15:09:58 Wound Measurements Length: (cm) 0 % Width: (cm) 0 % Depth:  (cm) 0 Area: (cm) 0 Volume: (cm) 0 Reduction in Area: 100% Reduction in Volume: 100% Wound Description Classification: Grade 1 Periwound Skin Texture Texture Color No Abnormalities  Noted: No No Abnormalities Noted: No Moisture No Abnormalities Noted: No Electronic Signature(s) Signed: 04/10/2018 4:21:54 PM By: Alric Quan Entered By: Alric Quan on 04/10/2018 15:13:51 Deshotel, Tevion L. ([id number]) -------------------------------------------------------------------------------- [place] Details Patient Name: [name], Bazil L. Date of Service: 04/10/2018 2:15 PM Medical Record Number: [id number] Patient Account Number: [id number] Date of Birth/Sex: 1960-11-03 (57 y.o. M) Treating RN: Montey Hora Primary Care Goran Olden: [name] Other Clinician: Referring Helon Wisinski: [name] Treating Salote Weidmann/Extender: Melburn Hake, [name] Weeks in Treatment: 1 Vital Signs Time Taken: 14:55 Temperature (F): 98.2 Height (in): 70 Pulse (bpm): 91 Weight (lbs): 302 Respiratory Rate (breaths/min): 20 Body Mass Index (BMI): 43.3 Blood Pressure (mmHg): 148/65 Reference Range: 80 - 120 mg / dl Electronic Signature(s) Signed: 04/10/2018 3:48:21 PM By: Montey Hora Entered By: Montey Hora on 04/10/2018 14:58:58

## 2018-04-13 ENCOUNTER — Ambulatory Visit (HOSPITAL_COMMUNITY)
Admission: RE | Admit: 2018-04-13 | Discharge: 2018-04-13 | Disposition: A | Discharge status: Home / Self Care | Payer: Medicaid Other | Source: Ambulatory Visit | Attending: Family | Admitting: Family

## 2018-04-13 ENCOUNTER — Encounter (HOSPITAL_COMMUNITY): Payer: Self-pay

## 2018-04-13 DIAGNOSIS — Z87891 Personal history of nicotine dependence: Secondary | ICD-10-CM | POA: Insufficient documentation

## 2018-04-13 DIAGNOSIS — I2510 Atherosclerotic heart disease of native coronary artery without angina pectoris: Secondary | ICD-10-CM | POA: Insufficient documentation

## 2018-04-13 DIAGNOSIS — I10 Essential (primary) hypertension: Secondary | ICD-10-CM | POA: Diagnosis not present

## 2018-04-13 DIAGNOSIS — M79604 Pain in right leg: Secondary | ICD-10-CM | POA: Insufficient documentation

## 2018-04-13 DIAGNOSIS — M79606 Pain in leg, unspecified: Secondary | ICD-10-CM

## 2018-04-13 DIAGNOSIS — E785 Hyperlipidemia, unspecified: Secondary | ICD-10-CM | POA: Insufficient documentation

## 2018-04-13 DIAGNOSIS — E119 Type 2 diabetes mellitus without complications: Secondary | ICD-10-CM | POA: Diagnosis not present

## 2018-04-13 DIAGNOSIS — M79605 Pain in left leg: Secondary | ICD-10-CM | POA: Insufficient documentation

## 2018-04-14 NOTE — Progress Notes (Signed)
XAN, INGRAHAM ([id number]) Visit Report for 04/10/2018 Chief Complaint Document Details Patient Name: [name], [name] Date of Service: 04/10/2018 2:15 PM Medical Record Number: [id number] Patient Account Number: [id number] Date of Birth/Sex: 1960/11/29 (57 y.o. M) Treating RN: Ahmed Prima Primary Care Provider: [name] Other Clinician: Referring Provider: [name] Treating Provider/Extender: Melburn Hake, [name] Weeks in Treatment: 1 Information Obtained from: Patient Chief Complaint Bilateral anterior lower leg ulcer Electronic Signature(s) Signed: 04/11/2018 7:52:50 AM By: Worthy Keeler PA-C Entered By: Worthy Keeler on 04/10/2018 15:03:26 [name], [name] ([id number]) -------------------------------------------------------------------------------- HPI Details Patient Name: [name], [name] Date of Service: 04/10/2018 2:15 PM Medical Record Number: [id number] Patient Account Number: [id number] Date of Birth/Sex: 03/07/1961 (57 y.o. M) Treating RN: Ahmed Prima Primary Care Provider: [name] Other Clinician: Referring Provider: [name] Treating Provider/Extender: Melburn Hake, [name] Weeks in Treatment: 1 History of Present Illness HPI Description: 04/03/18 patient presents today for initial evaluation and our clinic as root referral from his emergency department after patient was seen on 03/25/18 due to bilateral leg swelling. He had blistering along with burning discomfort. According to the report patient does take Lasix daily as prescribed as well is a potassium supplement. He has not been wearing his compression stockings and states these are thigh-high and he does not like to wear them at all. Subsequently he has previously warned stockings on a regular basis but these are just too uncomfortable. He did not have any fever no evidence of cellulitis at that point in time. Subsequently they seem to be more focused on his potassium level during the  stay. Nonetheless the patient was discharged and told to call the wound care center for a follow-up appointment to evaluate his lower extremities. He does have evidence of lymphedema this changes including thickened/scaly skin over the bilateral lower extremities this does appear to be stage III lymphedema. Patient has had a vascular ultrasound for DVT on 12/07/17 and apparently has a repeat evaluation in July. However I did not find venous reflex testing as a part of any of the studies seen through epic. Subsequently his hemoglobin A-1 C was 8.9 as obtained on 11/09/17. Patient states that he has pain and seems to be more consistent with neuropathy but really no significant discomfort otherwise at this point. He does have a history of hypertension, DVT, lymphedema, congestive heart failure, and diabetes mellitus type II. 04/10/18 on evaluation today patient's bilateral lower extremities actually appear to be doing well from the standpoint of ulcers. There do not appear to be any significant issues with his legs and weeping either. In fact they are little bit dry if anything. Overall I'm pleased with how things seem to be progressing. He is scheduled for his vascular appointment upcoming shortly him we had this [facility] is July 8 but he tells me that he is actually seeing them on the 21st. That is of June. This is even better news obviously. The sooner we can determine whether or not the patient can tolerate compression therapy the sooner I think we can get him better. Electronic Signature(s) Signed: 04/11/2018 7:52:50 AM By: Worthy Keeler PA-C Entered By: Worthy Keeler on 04/10/2018 17:16:11 [name], Juan LMarland Kitchen ([id number]) -------------------------------------------------------------------------------- Physical Exam Details Patient Name: [name], [name] Date of Service: 04/10/2018 2:15 PM Medical Record Number: [id number] Patient Account Number: [id number] Date of Birth/Sex: 1961/07/30 (57  y.o. M) Treating RN: Ahmed Prima Primary Care Provider: [name] Other Clinician: Referring Provider: [name] Treating Provider/Extender: Melburn Hake, [name]  Weeks in Treatment: 1 Constitutional Obese and well-hydrated in no acute distress. Respiratory normal breathing without difficulty. clear to auscultation bilaterally. Cardiovascular 1+ pitting edema of the bilateral lower extremities. Psychiatric this patient is able to make decisions and demonstrates good insight into disease process. Alert and Oriented x 3. pleasant and cooperative. Notes At this point on evaluation patient's leg show no evidence of weeping or wounds which is great news. He does have swelling this is a little bit better but nonetheless would do even better with better that is stronger compression. In general however I'm very pleased with the progress. Electronic Signature(s) Signed: 04/11/2018 7:52:50 AM By: Worthy Keeler PA-C Entered By: Worthy Keeler on 04/10/2018 17:17:59 [name], [name] ([id number]) -------------------------------------------------------------------------------- Physician Orders Details Patient Name: [name], [name] Date of Service: 04/10/2018 2:15 PM Medical Record Number: [id number] Patient Account Number: [id number] Date of Birth/Sex: 1961/04/12 (57 y.o. M) Treating RN: Ahmed Prima Primary Care Provider: [name] Other Clinician: Referring Provider: [name] Treating Provider/Extender: Melburn Hake, [name] Weeks in Treatment: 1 Verbal / Phone Orders: Yes Clinician: Pinkerton, Debi Read Back and Verified: Yes Diagnosis Coding ICD-10 Coding Code Description E11.622 Type 2 diabetes mellitus with other skin ulcer I89.0 Lymphedema, not elsewhere classified L97.811 Non-pressure chronic ulcer of other part of right lower leg limited to breakdown of skin L97.821 Non-pressure chronic ulcer of other part of left lower leg limited to breakdown of skin I45.80  Chronic diastolic (congestive) heart failure E11.40 Type 2 diabetes mellitus with diabetic neuropathy, unspecified I10 Essential (primary) hypertension Wound Cleansing o Cleanse wound with mild soap and water o May Shower, gently pat wound dry prior to applying new dressing. Skin Barriers/Peri-Wound Care o Moisturizing lotion - bilateral legs Follow-up Appointments o Return Appointment in 1 week. Edema Control o Elevate legs to the level of the heart and pump ankles as often as possible - bilateral legs o Other: - tubigrip bilateral legs bilateral legs Additional Orders / Instructions o Increase protein intake. Home Health o D/C Home Health Services Electronic Signature(s) Signed: 04/10/2018 4:21:54 PM By: Alric Quan Signed: 04/11/2018 7:52:50 AM By: Worthy Keeler PA-C Entered By: Alric Quan on 04/10/2018 15:15:56 [name], [name] ([id number]) -------------------------------------------------------------------------------- Problem List Details Patient Name: [name], Garwood L. Date of Service: 04/10/2018 2:15 PM Medical Record Number: [id number] Patient Account Number: [id number] Date of Birth/Sex: 1961-04-27 (57 y.o. 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 ([id number]) -------------------------------------------------------------------------------- Progress Note Details Patient Name: [name], Frans L. Date of Service: 04/10/2018 2:15 PM Medical Record Number: [id number] Patient Account Number: [id number] Date of Birth/Sex: [birth year] (57 y.o. M) Treating RN: Ahmed Prima Primary Care Provider: [name] Other Clinician: Referring Provider: Amedeo Plenty,  ROMANA Treating Provider/Extender: [name], [name] Weeks in Treatment: 1 Subjective Chief Complaint Information obtained from Patient Bilateral anterior lower leg ulcer History of Present Illness (HPI) 04/03/18 patient presents today for initial evaluation and our clinic as root referral from his emergency department after patient was seen on 03/25/18 due to bilateral leg swelling. He had blistering along with burning discomfort. According to the report patient does take Lasix daily as prescribed as well is a potassium supplement. He has not been wearing his compression stockings and states these are thigh-high and he does not like to wear them at all. Subsequently he has previously warned stockings on a regular basis but these are just too uncomfortable. He did not have any fever no evidence of cellulitis at that point in time. Subsequently they seem to be more focused on his potassium level during the stay. Nonetheless the patient was discharged and told to call the wound care center for a follow-up appointment to evaluate his lower extremities. He does have evidence of lymphedema this changes including thickened/scaly skin over the bilateral lower extremities this does appear to be stage III lymphedema.  Patient has had a vascular ultrasound for DVT on 12/07/17 and apparently has a repeat evaluation in July. However I did not find venous reflex testing as a part of any of the studies seen through epic. Subsequently his hemoglobin A-1 C was 8.9 as obtained on 11/09/17. Patient states that he has pain and seems to be more consistent with neuropathy but really no significant discomfort otherwise at this point. He does have a history of hypertension, DVT, lymphedema, congestive heart failure, and diabetes mellitus type II. 04/10/18 on evaluation today patient's bilateral lower extremities actually appear to be doing well from the standpoint of ulcers. There do not appear to be any significant issues with his legs and weeping either. In fact they are little bit dry if anything. Overall I'm pleased with how things seem to be progressing. He is scheduled for his vascular appointment upcoming shortly him we had this [facility] is July 8 but he tells me that he is actually seeing them on the 21st. That is of June. This is even better news obviously. The sooner we can determine whether or not the patient can tolerate compression therapy the sooner I think we can get him better. Patient History Information obtained from Patient. Family History Cancer - Father,Mother, Diabetes, Seizures - Mother, Stroke - Siblings, No family history of Heart Disease, Hereditary Spherocytosis, Hypertension, Kidney Disease, Lung Disease, Thyroid Problems, Tuberculosis. Social History Never smoker, Marital Status - Single, Alcohol Use - Never, Drug Use - No History, Caffeine Use - Daily. 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 ([id number]) Review of Systems (ROS) Constitutional Symptoms (General Health) Denies complaints or symptoms of Fever, Chills. Respiratory The patient has no complaints or  symptoms. Cardiovascular Complains or has symptoms of LE edema. Psychiatric The patient has no complaints or symptoms. Objective Constitutional Obese and well-hydrated in no acute distress. Vitals Time Taken: 2:55 PM, Height: 70 in, Weight: 302 lbs, BMI: 43.3, Temperature: 98.2 F, Pulse: 91 bpm, Respiratory Rate: 20 breaths/min, Blood Pressure: 148/65 mmHg. Respiratory normal breathing without difficulty. clear to auscultation bilaterally. Cardiovascular 1+ pitting edema of the bilateral lower extremities. Psychiatric this patient is able to make decisions and demonstrates good insight into disease process. Alert and Oriented x 3. pleasant and cooperative. General Notes: At this point on evaluation patient's leg  show no evidence of weeping or wounds which is great news. He does have swelling this is a little bit better but nonetheless would do even better with better that is stronger compression. In general however I'm very pleased with the progress. Integumentary (Hair, Skin) Wound #1 status is Healed - Epithelialized. Original cause of wound was Gradually Appeared. The wound is located on the Right,Anterior Lower Leg. The wound measures 0cm length x 0cm width x 0cm depth; 0cm^2 area and 0cm^3 volume. There is a medium amount of serous drainage noted. The wound margin is flat and intact. There is large (67-100%) pink granulation within the wound bed. There is no necrotic tissue within the wound bed. The periwound skin appearance exhibited: Hemosiderin Staining. The periwound skin appearance did not exhibit: Callus, Crepitus, Excoriation, Induration, Rash, Scarring, Dry/Scaly, Maceration, Atrophie Blanche, Cyanosis, Ecchymosis, Mottled, Pallor, Rubor, Erythema. Periwound temperature was noted as No Abnormality. The periwound has tenderness on palpation. Wound #2 status is Healed - Epithelialized. Original cause of wound was Gradually Appeared. The wound is located on the Left,Anterior  Lower Leg. The wound measures 0cm length x 0cm width x 0cm depth; 0cm^2 area and 0cm^3 volume. 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 Skin Barriers/Peri-Wound Care: Moisturizing lotion - bilateral legs Follow-up Appointments: Return Appointment in 1 week. Edema Control: Elevate legs to the level of the heart and pump ankles as often as possible - bilateral legs Other: - tubigrip bilateral legs bilateral legs Additional Orders / Instructions: Increase protein intake. Home Health: D/C Home Health Services I am going to suggest currently based on the findings that we see at this point that we go ahead and continue with the Current wound care measures for the next week at least until we get the results of his arterial study back. Then hopefully we will be able to initiate a stronger compression therapy as well as recommending what strength of compression stockings he would need. Patient completely understands. Please see above for specific wound care orders. We will see patient for re-evaluation in 1 week(s) here in the clinic. If anything worsens or changes patient will contact our office for additional recommendations. Electronic Signature(s) Signed: 04/11/2018 7:52:50 AM By: Worthy Keeler PA-C Entered By: Worthy Keeler on 04/10/2018 17:19:46 [name], Juan LMarland Kitchen  ([id number]) -------------------------------------------------------------------------------- ROS/PFSH Details Patient Name: [name], Ulysess L. Date of Service: 04/10/2018 2:15 PM Medical Record Number: [id number] Patient Account Number: [id number] Date of Birth/Sex: 18-Feb-1961 (57 y.o. M) Treating RN: Ahmed Prima Primary Care Provider: [name] Other Clinician: Referring Provider: [name] Treating Provider/Extender: Melburn Hake, [name] Weeks in Treatment: 1 Information Obtained From Patient Wound History Do you currently have one or more open woundso Yes Approximately how long have you had your woundso 2 weeks How have you been treating your wound(s) until nowo nurse is wrapping Has your wound(s) ever healed and then re-openedo No Have you had any lab work done in the past montho Yes Who ordered the lab [facility] Have you tested positive for an antibiotic resistant  organism (MRSA, VRE)o No Have you tested positive for osteomyelitis (bone infection)o No Have you had any tests for circulation on your legso No Constitutional Symptoms (General Health) Complaints and Symptoms: Negative for: Fever; Chills Cardiovascular Complaints and Symptoms: Positive for: LE edema Medical History: Positive for: Congestive Heart Failure; Coronary Artery Disease; Deep Vein Thrombosis; Hypertension Negative for: Angina; Arrhythmia; Hypotension; Myocardial Infarction; Peripheral Arterial Disease; Peripheral Venous Disease; Phlebitis; Vasculitis Eyes Medical History: Negative for: Cataracts; Glaucoma; Optic Neuritis Ear/Nose/Mouth/Throat Medical History: Negative for: Chronic sinus problems/congestion; Middle ear problems Hematologic/Lymphatic Medical History: Negative for: Anemia; Hemophilia; Human Immunodeficiency Virus; Lymphedema; Sickle Cell Disease Respiratory Complaints and Symptoms: No Complaints or Symptoms Medical History: Negative for: Aspiration; Asthma; Chronic  Obstructive Pulmonary Disease (COPD); Pneumothorax; Sleep Apnea; [name], [name] ([id number]) Tuberculosis Gastrointestinal Medical History: Negative for: Cirrhosis ; Colitis; Crohnos; Hepatitis A; Hepatitis B; Hepatitis C Past Medical History Notes: PUD, IBS Endocrine Medical History: Positive for: Type II Diabetes Negative for: Type I Diabetes Treated with: Insulin, Oral agents Genitourinary Medical History: Negative for: End Stage Renal Disease Past Medical History Notes: kidney disease in hospital Immunological Medical History: Negative for: Lupus Erythematosus; Raynaudos; Scleroderma Integumentary (Skin) Medical History: Negative for: History of Burn; History of pressure wounds Musculoskeletal Medical History: Positive for: Osteoarthritis Negative for: Gout; Rheumatoid Arthritis; Osteomyelitis Neurologic Medical History: Positive for: Neuropathy Negative for: Dementia; Quadriplegia; Paraplegia; Seizure Disorder Past Medical History Notes: history of CVA Oncologic Medical History: Negative for: Received Chemotherapy; Received Radiation Psychiatric Complaints and Symptoms: No Complaints or Symptoms Medical History: [name], [name] ([id number]) Negative for: Anorexia/bulimia; Confinement Anxiety Immunizations Pneumococcal Vaccine: Received Pneumococcal Vaccination: No Implantable Devices Family and Social History Cancer: Yes - Father,Mother; Diabetes: Yes; Heart Disease: No; Hereditary Spherocytosis: No; Hypertension: No; Kidney Disease: No; Lung Disease: No; Seizures: Yes - Mother; Stroke: Yes - Siblings; Thyroid Problems: No; Tuberculosis: No; Never smoker; Marital Status - Single; Alcohol Use: Never; Drug Use: No History; Caffeine Use: Daily; Financial Concerns: No; Food, Clothing or Shelter Needs: No; Support System Lacking: No; Transportation Concerns: No; Advanced Directives: No; Patient does not want information on Advanced Directives Physician  Affirmation I have reviewed and agree with the above information. Electronic Signature(s) Signed: 04/11/2018 7:52:50 AM By: Worthy Keeler PA-C Signed: 04/12/2018 4:29:22 PM By: Alric Quan Entered By: Worthy Keeler on 04/10/2018 17:17:30 [name], [name] ([id number]) -------------------------------------------------------------------------------- SuperBill Details Patient Name: [name], Indio L. Date of Service: 04/10/2018 Medical Record Number: [id number] Patient Account Number: [id number] Date of Birth/Sex: [birth year] (57 y.o. M) Treating RN: Ahmed Prima Primary Care Provider: [name] Other Clinician: Referring Provider: [name] Treating Provider/Extender: Melburn Hake, [name] Weeks in Treatment: 1 Diagnosis Coding ICD-10 Codes Code Description E11.622 Type 2 diabetes mellitus with other skin ulcer I89.0 Lymphedema, not elsewhere classified L97.811 Non-pressure chronic ulcer of other part of right lower leg limited to breakdown of skin L97.821 Non-pressure chronic ulcer of other part of left lower leg limited to breakdown of skin V61.60 Chronic diastolic (congestive) heart failure E11.40 Type 2 diabetes mellitus with diabetic neuropathy, unspecified I10 Essential (primary) hypertension Facility Procedures CPT4 Code: [id number] Description: 94854 - WOUND CARE VISIT-LEV 2 EST PT Modifier: Quantity: 1 Physician Procedures CPT4 Code Description: [id number] - WC PHYS LEVEL 3 - EST PT ICD-10 Diagnosis Description E11.622 Type 2 diabetes mellitus with other skin ulcer L97.811 Non-pressure chronic ulcer of other part of right lower leg lim I89.0 Lymphedema, not elsewhere  classified L97.821 Non-pressure chronic ulcer of other part of left lower leg limi Modifier:  ited to breakdown ted to breakdown Quantity: 1 of skin of skin Electronic Signature(s) Signed: 04/11/2018 7:52:50 AM By: Worthy Keeler PA-C Entered By: Worthy Keeler on 04/10/2018 17:20:19

## 2018-04-17 ENCOUNTER — Encounter: Payer: Medicaid Other | Admitting: Physician Assistant

## 2018-04-17 DIAGNOSIS — E11622 Type 2 diabetes mellitus with other skin ulcer: Secondary | ICD-10-CM | POA: Diagnosis not present

## 2018-04-19 ENCOUNTER — Ambulatory Visit: Payer: Medicaid Other

## 2018-04-19 NOTE — Progress Notes (Signed)
RAYLAN, HANTON ([id number]) Visit Report for 04/17/2018 Arrival Information Details Patient Name: [name], [name]. Date of Service: 04/17/2018 10:30 AM Medical Record Number: [id number] Patient Account Number: [id number] Date of Birth/Sex: [birth year] (57 y.o. M) Treating RN: Roger Shelter Primary Care Tadashi Burkel: [name] Other Clinician: Referring Ellarie Picking: [name] Treating Marcanthony Sleight/Extender: Melburn Hake, [name] Weeks in Treatment: 2 Visit Information History Since Last Visit All ordered tests and consults were completed: No Patient Arrived: Ambulatory Added or deleted any medications: No Arrival Time: 10:33 Any new allergies or adverse reactions: No Accompanied By: self Had a fall or experienced change in No Transfer Assistance: None activities of daily living that may affect Patient Identification Verified: Yes risk of falls: Secondary Verification Process Yes Signs or symptoms of abuse/neglect since last visito No Completed: Hospitalized since last visit: No Patient Has Alerts: Yes Implantable device outside of the clinic excluding No Patient Alerts: DMII cellular tissue based products placed in the center ABI [place] BILATERAL since last visit: >220 Pain Present Now: No Electronic Signature(s) Signed: 04/17/2018 4:47:28 PM By: Roger Shelter Entered By: Roger Shelter on 04/17/2018 10:33:44 Robidoux, Sherrod L. ([id number]) -------------------------------------------------------------------------------- Clinic Level of Care Assessment Details Patient Name: [name], Price L. Date of Service: 04/17/2018 10:30 AM Medical Record Number: [id number] Patient Account Number: [id number] Date of Birth/Sex: 1961-08-20 (57 y.o. M) Treating RN: Montey Hora Primary Care Elvin Mccartin: [name] Other Clinician: Referring Ericca Labra: [name] Treating Kemp Gomes/Extender: Melburn Hake, [name] Weeks in Treatment: 2 Clinic Level of Care Assessment Items TOOL 4 Quantity  Score []  - Use when only an EandM is performed on FOLLOW-UP visit 0 ASSESSMENTS - Nursing Assessment / Reassessment X - Reassessment of Co-morbidities (includes updates in patient status) 1 10 X- 1 5 Reassessment of Adherence to Treatment Plan ASSESSMENTS - Wound and Skin Assessment / Reassessment X - Simple Wound Assessment / Reassessment - one wound 1 5 []  - 0 Complex Wound Assessment / Reassessment - multiple wounds []  - 0 Dermatologic / Skin Assessment (not related to wound area) ASSESSMENTS - Focused Assessment X - Circumferential Edema Measurements - multi extremities 2 5 []  - 0 Nutritional Assessment / Counseling / Intervention X- 1 5 Lower Extremity Assessment (monofilament, tuning fork, pulses) []  - 0 Peripheral Arterial Disease Assessment (using hand held doppler) ASSESSMENTS - Ostomy and/or Continence Assessment and Care []  - Incontinence Assessment and Management 0 []  - 0 Ostomy Care Assessment and Management (repouching, etc.) PROCESS - Coordination of Care X - Simple Patient / Family Education for ongoing care 1 15 []  - 0 Complex (extensive) Patient / Family Education for ongoing care []  - 0 Staff obtains Programmer, systems, Records, Test Results / Process Orders []  - 0 Staff telephones HHA, Nursing Homes / Clarify orders / etc []  - 0 Routine Transfer to another Facility (non-emergent condition) []  - 0 Routine Hospital Admission (non-emergent condition) []  - 0 New Admissions / Biomedical engineer / Ordering NPWT, Apligraf, etc. []  - 0 Emergency Hospital Admission (emergent condition) X- 1 10 Simple Discharge Coordination Koker, Silver L. ([id number]) []  - 0 Complex (extensive) Discharge Coordination PROCESS - Special Needs []  - Pediatric / Minor Patient Management 0 []  - 0 Isolation Patient Management []  - 0 Hearing / Language / Visual special needs []  - 0 Assessment of Community assistance (transportation, D/C planning, etc.) []  - 0 Additional  assistance / Altered mentation []  - 0 Support Surface(s) Assessment (bed, cushion, seat, etc.) INTERVENTIONS - Wound Cleansing / Measurement X - Simple Wound Cleansing - one wound 1 5 []  -  0 Complex Wound Cleansing - multiple wounds X- 1 5 Wound Imaging (photographs - any number of wounds) []  - 0 Wound Tracing (instead of photographs) X- 1 5 Simple Wound Measurement - one wound []  - 0 Complex Wound Measurement - multiple wounds INTERVENTIONS - Wound Dressings X - Small Wound Dressing one or multiple wounds 1 10 []  - 0 Medium Wound Dressing one or multiple wounds []  - 0 Large Wound Dressing one or multiple wounds []  - 0 Application of Medications - topical []  - 0 Application of Medications - injection INTERVENTIONS - Miscellaneous []  - External ear exam 0 []  - 0 Specimen Collection (cultures, biopsies, blood, body fluids, etc.) []  - 0 Specimen(s) / Culture(s) sent or taken to Lab for analysis []  - 0 Patient Transfer (multiple staff / Civil Service fast streamer / Similar devices) []  - 0 Simple Staple / Suture removal (25 or less) []  - 0 Complex Staple / Suture removal (26 or more) []  - 0 Hypo / Hyperglycemic Management (close monitor of Blood Glucose) []  - 0 Ankle / Brachial Index (ABI) - do not check if billed separately X- 1 5 Vital Signs Segundo, Darvell L. ([id number]) Has the patient been seen at the hospital within the last three years: Yes Total Score: 90 Level Of Care: New/Established - Level 3 Electronic Signature(s) Signed: 04/17/2018 5:42:13 PM By: Montey Hora Entered By: Montey Hora on 04/17/2018 11:35:38 Welden, Marshel L. ([id number]) -------------------------------------------------------------------------------- Encounter Discharge Information Details Patient Name: [name], Manolo L. Date of Service: 04/17/2018 10:30 AM Medical Record Number: [id number] Patient Account Number: [id number] Date of Birth/Sex: 01-23-1961 (57 y.o. M) Treating RN: Cornell Barman Primary  Care Leonarda Leis: [name] Other Clinician: Referring Sherrell Weir: [name] Treating Marigold Mom/Extender: Sharalyn Ink in Treatment: 2 Encounter Discharge Information Items Discharge Condition: Stable Ambulatory Status: Ambulatory Discharge Destination: Home Transportation: Private Auto Accompanied By: self Schedule Follow-up Appointment: Yes Clinical Summary of Care: Electronic Signature(s) Signed: 04/17/2018 5:19:43 PM By: Gretta Cool, BSN, RN, CWS, Kim RN, BSN Entered By: Gretta Cool, BSN, RN, CWS, Kim on 04/17/2018 11:51:14 [name], Susano Carlean Jews ([id number]) -------------------------------------------------------------------------------- Lower Extremity Assessment Details Patient Name: Mantel, Orestes L. Date of Service: 04/17/2018 10:30 AM Medical Record Number: [id number] Patient Account Number: [id number] Date of Birth/Sex: 1961-04-16 (57 y.o. M) Treating RN: Roger Shelter Primary Care Juliano Mceachin: [name] Other Clinician: Referring Jaylend Reiland: [name] Treating Makaylyn Sinyard/Extender: Melburn Hake, [name] Weeks in Treatment: 2 Edema Assessment Assessed: [Left: No] [Right: No] Edema: [Left: Yes] [Right: Yes] Calf Left: Right: Point of Measurement: 37 cm From Medial Instep 47 cm 46 cm Ankle Left: Right: Point of Measurement: 13 cm From Medial Instep 27.5 cm 27.5 cm Vascular Assessment Claudication: Claudication Assessment [Left:None] [Right:None] Pulses: Dorsalis Pedis Palpable: [Left:No] [Right:No] Doppler Audible: [Left:Yes] [Right:Yes] Posterior Tibial Extremity colors, hair growth, and conditions: Extremity Color: [Left:Hyperpigmented] [Right:Hyperpigmented] Hair Growth on Extremity: [Left:No] [Right:No] Temperature of Extremity: [Left:Warm] [Right:Warm] Capillary Refill: [Left:< 3 seconds] [Right:< 3 seconds] Toe Nail Assessment Left: Right: Thick: Yes Yes Discolored: Yes Yes Deformed: No No Improper Length and Hygiene: No No Electronic Signature(s) Signed:  04/17/2018 4:47:28 PM By: Roger Shelter Entered By: Roger Shelter on 04/17/2018 10:47:44 Kayes, Franko L. ([id number]) -------------------------------------------------------------------------------- Multi Wound Chart Details Patient Name: [name], Torie L. Date of Service: 04/17/2018 10:30 AM Medical Record Number: [id number] Patient Account Number: [id number] Date of Birth/Sex: 11-14-1960 (57 y.o. 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] [N/A:N/A] Secondary Etiology: [2R:Lymphedema] [N/A:N/A] Comorbid History: [2R:Congestive Heart Failure, Coronary Artery Disease, Deep Vein Thrombosis, Hypertension, Type II Diabetes, Osteoarthritis, Neuropathy] [N/A:N/A] Date Acquired: [2R:04/14/2018] [N/A:N/A] Weeks of Treatment: [2R:2] [N/A:N/A] Wound Status: [2R:Open] [N/A:N/A] Wound Recurrence: [2R:Yes] [N/A:N/A] Clustered Wound: [2R:Yes] [N/A:N/A] Measurements L x W x D [2R:4.5x7x0.1] [N/A:N/A] (cm) Area (cm) : [2R:24.74] [N/A:N/A] Volume (cm) : [2R:2.474] [N/A:N/A] % Reduction in Area: [2R:-250.00%] [N/A:N/A] % Reduction in Volume: [2R:-249.90%] [N/A:N/A] Classification: [2R:Grade 1] [N/A:N/A] Exudate Amount: [2R:Medium] [N/A:N/A] Exudate Type: [2R:Serous] [N/A:N/A] Exudate Color: [2R:amber] [N/A:N/A] Wound Margin: [2R:Distinct, outline attached] [N/A:N/A] Granulation Amount: [2R:Medium (34-66%)] [N/A:N/A] Granulation Quality: [2R:Red, Pink]  [N/A:N/A] Necrotic Amount: [2R:Medium (34-66%)] [N/A:N/A] Exposed Structures: [2R:Fascia: No Fat Layer (Subcutaneous Tissue) Exposed: No Tendon: No Muscle: No] [N/A:N/A] Joint: No Bone: No Epithelialization: Small (1-33%) N/A N/A Periwound Skin Texture: Excoriation: No N/A N/A Induration: No Callus: No Crepitus: No Rash: No Scarring: No Periwound Skin Moisture: Dry/Scaly: Yes N/A N/A Maceration: No Periwound Skin Color: Atrophie Blanche: No N/A N/A Cyanosis: No Ecchymosis: No Erythema: No Hemosiderin Staining: No Mottled: No Pallor: No Rubor: No Tenderness on Palpation: No N/A N/A Wound Preparation: Ulcer Cleansing: N/A N/A Rinsed/Irrigated with Saline Topical Anesthetic Applied: Other: lidocaine 4% Treatment Notes Electronic Signature(s) Signed: 04/17/2018 5:42:13 PM By: Montey Hora Entered By: Montey Hora on 04/17/2018 11:31:39 Iten, Barre Carlean Jews ([id number]) -------------------------------------------------------------------------------- [place] Details Patient Name: [name], Benicio L. Date of Service: 04/17/2018 10:30 AM Medical Record Number: [id number] Patient Account Number: [id number] Date of Birth/Sex: Sep 24, 1961 (57 y.o. M) Treating RN: Montey Hora Primary Care Martino Tompson: [name] Other Clinician: Referring Ahleah Simko: [name] Treating Tawona Filsinger/Extender: Melburn Hake, [name] Weeks in Treatment: 2 Active Inactive ` Abuse / Safety / Falls / Self Care Management Nursing Diagnoses: Potential for falls Goals: Patient will not experience any injury related to falls Date Initiated: 04/03/2018 Target Resolution Date: 06/30/2018 Goal Status: Active Interventions: Assess Activities of Daily Living upon admission and as needed Assess fall risk on admission and as needed Assess: immobility, friction, shearing, incontinence upon admission and as needed Assess impairment of mobility on admission and as needed per policy Assess  personal safety and home safety (as indicated) on admission and as needed Assess self care needs on admission and as needed Notes: ` Nutrition Nursing Diagnoses: Imbalanced nutrition Impaired glucose control: actual or potential Potential for alteratiion in Nutrition/Potential for imbalanced nutrition Goals: Patient/caregiver agrees to and verbalizes understanding of need to use nutritional supplements and/or vitamins as prescribed Date Initiated: 04/03/2018 Target Resolution Date: 07/28/2018 Goal Status: Active Interventions: Assess patient nutrition upon admission and as needed per policy Provide education on elevated blood sugars and impact on wound healing Provide education on nutrition Notes: ` Orientation to the Wound Care Program LEVY, CEDANO ([id number]) Nursing Diagnoses: Knowledge deficit related to the wound healing center program Goals: Patient/caregiver will verbalize understanding of the [place] Date Initiated: 04/03/2018 Target Resolution Date: 04/28/2018 Goal Status: Active Interventions: Provide education on orientation to the wound center Notes: ` Wound/Skin Impairment Nursing Diagnoses: Impaired tissue integrity Knowledge deficit related to ulceration/compromised skin integrity Goals: Ulcer/skin breakdown will have a volume reduction of 80% by week 12 Date Initiated: 04/03/2018 Target Resolution Date: 07/21/2018 Goal Status: Active Interventions: Assess patient/caregiver ability to perform ulcer/skin care regimen  upon admission and as needed Assess ulceration(s) every visit Notes: Electronic Signature(s) Signed: 04/17/2018 5:42:13 PM By: Montey Hora Entered By: Montey Hora on 04/17/2018 11:31:18 Gaw, Cooper L. ([id number]) -------------------------------------------------------------------------------- Pain Assessment Details Patient Name: [name], Kha L. Date of Service: 04/17/2018 10:30 AM Medical Record Number:  [id number] Patient Account Number: [id number] Date of Birth/Sex: Mar 12, 1961 (58 y.o. M) Treating RN: Roger Shelter Primary Care Preslyn Warr: [name] Other Clinician: Referring Nayquan Evinger: [name] Treating Vanna Sailer/Extender: Melburn Hake, [name] Weeks in Treatment: 2 Active Problems Location of Pain Severity and Description of Pain Patient Has Paino No Site Locations Pain Management and Medication Current Pain Management: Electronic Signature(s) Signed: 04/17/2018 4:47:28 PM By: Roger Shelter Entered By: Roger Shelter on 04/17/2018 10:34:49 Bramble, Eddrick L. ([id number]) -------------------------------------------------------------------------------- Patient/Caregiver Education Details Patient Name: [name], Dewey L. Date of Service: 04/17/2018 10:30 AM Medical Record Number: [id number] Patient Account Number: [id number] Date of Birth/Gender: [birth year] (57 y.o. M) Treating RN: Cornell Barman Primary Care Physician: [name] Other Clinician: Referring Physician: [name] Treating Physician/Extender: Sharalyn Ink in Treatment: 2 Education Assessment Education Provided To: Patient Education Topics Provided Wound/Skin Impairment: Handouts: Caring for Your Ulcer Methods: Demonstration, Explain/Verbal Responses: State content correctly Electronic Signature(s) Signed: 04/17/2018 5:19:43 PM By: Gretta Cool, BSN, RN, CWS, Kim RN, BSN Entered By: Gretta Cool, BSN, RN, CWS, Kim on 04/17/2018 11:51:36 Lincks, Juleon Carlean Jews ([id number]) -------------------------------------------------------------------------------- Wound Assessment Details Patient Name: [name], Captain L. Date of Service: 04/17/2018 10:30 AM Medical Record Number: [id number] Patient Account Number: [id number] Date of Birth/Sex: 04-19-1961 (57 y.o. M) Treating RN: Roger Shelter Primary Care Del Overfelt: [name] Other Clinician: Referring [place] Seaborn: [name] Treating Lindell Renfrew/Extender: [name],  [name] Weeks in Treatment: 2 Wound Status Wound Number: 2R Primary Diabetic Wound/Ulcer of the Lower Extremity Etiology: Wound Location: Left Lower Leg - Anterior Secondary Lymphedema Wounding Event: Gradually Appeared Etiology: Date Acquired: 04/14/2018 Wound Open Weeks Of Treatment: 2 Status: Clustered Wound: Yes Comorbid Congestive Heart Failure, Coronary Artery History: Disease, Deep Vein Thrombosis, Hypertension, Type II Diabetes, Osteoarthritis, Neuropathy Photos Photo Uploaded By: Montey Hora on 04/17/2018 17:44:00 Wound Measurements Length: (cm) 4.5 Width: (cm) 7 Depth: (cm) 0.1 Area: (cm) 24.74 Volume: (cm) 2.474 % Reduction in Area: -250% % Reduction in Volume: -249.9% Epithelialization: Small (1-33%) Tunneling: No Undermining: No Wound Description Classification: Grade 1 Wound Margin: Distinct, outline attached Exudate Amount: Medium Exudate Type: Serous Exudate Color: amber Foul Odor After Cleansing: No Slough/Fibrino Yes Wound Bed Granulation Amount: Medium (34-66%) Exposed Structure Granulation Quality: Red, Pink Fascia Exposed: No Necrotic Amount: Medium (34-66%) Fat Layer (Subcutaneous Tissue) Exposed: No Necrotic Quality: Adherent Slough Tendon Exposed: No Muscle Exposed: No Joint Exposed: No Bone Exposed: No Bors, Lundy L. ([id number]) Periwound Skin Texture Texture Color No Abnormalities Noted: No No Abnormalities Noted: No Callus: No Atrophie Blanche: No Crepitus: No Cyanosis: No Excoriation: No Ecchymosis: No Induration: No Erythema: No Rash: No Hemosiderin Staining: No Scarring: No Mottled: No Pallor: No Moisture Rubor: No No Abnormalities Noted: No Dry / Scaly: Yes Maceration: No Wound Preparation Ulcer Cleansing: Rinsed/Irrigated with Saline Topical Anesthetic Applied: Other: lidocaine 4%, Treatment Notes Wound #2R (Left, Anterior Lower Leg) 1. Cleansed with: Clean wound with Normal Saline 4. Dressing  Applied: Other dressing (specify in notes) 5. Secondary Dressing Applied ABD and Kerlix/Conform Notes silver cell, ABD, Conform, secured with tubigrip G Electronic Signature(s) Signed: 04/17/2018 4:47:28 PM By: Roger Shelter Entered By: Roger Shelter on 04/17/2018 10:43:56 Dunbar, Landynn L. ([id number]) -------------------------------------------------------------------------------- [place] Details Patient Name: [name], Daryon L. Date of Service: 04/17/2018 10:30  AM Medical Record Number: [id number] Patient Account Number: [id number] Date of Birth/Sex: Jan 10, 1961 (57 y.o. M) Treating RN: Roger Shelter Primary Care Zilpha Mcandrew: [name] Other Clinician: Referring Averil Digman: [name] Treating Nichalas Coin/Extender: Melburn Hake, [name] Weeks in Treatment: 2 Vital Signs Time Taken: 10:34 Temperature (F): 98.4 Height (in): 70 Pulse (bpm): 93 Weight (lbs): 302 Respiratory Rate (breaths/min): 18 Body Mass Index (BMI): 43.3 Blood Pressure (mmHg): 133/71 Reference Range: 80 - 120 mg / dl Electronic Signature(s) Signed: 04/17/2018 4:47:28 PM By: Roger Shelter Entered By: Roger Shelter on 04/17/2018 10:35:11

## 2018-04-19 NOTE — Progress Notes (Signed)
TATSUO, MUSIAL ([id number]) Visit Report for 04/17/2018 Chief Complaint Document Details Patient Name: Stein, [name] Date of Service: 04/17/2018 10:30 AM Medical Record Number: [id number] Patient Account Number: [id number] Date of Birth/Sex: 04/08/1961 (57 y.o. M) Treating RN: Ahmed Prima Primary Care Provider: [name] Other Clinician: Referring Provider: [name] Treating Provider/Extender: Melburn Hake, Avereigh Spainhower Weeks in Treatment: 2 Information Obtained from: Patient Chief Complaint Bilateral anterior lower leg ulcer Electronic Signature(s) Signed: 04/18/2018 12:21:38 AM By: Worthy Keeler PA-C Entered By: Worthy Keeler on 04/17/2018 10:45:56 Stein, [name] ([id number]) -------------------------------------------------------------------------------- HPI Details Patient Name: [name], [name] Date of Service: 04/17/2018 10:30 AM Medical Record Number: [id number] Patient Account Number: [id number] Date of Birth/Sex: 15-Mar-1961 (57 y.o. M) Treating RN: Montey Hora Primary Care Provider: [name] Other Clinician: Referring Provider: [name] Treating Provider/Extender: Melburn Hake, Ferd Horrigan Weeks in Treatment: 2 History of Present Illness HPI Description: 04/03/18 patient presents today for initial evaluation and our clinic as root referral from his emergency department after patient was seen on 03/25/18 due to bilateral leg swelling. He had blistering along with burning discomfort. According to the report patient does take Lasix daily as prescribed as well is a potassium supplement. He has not been wearing his compression stockings and states these are thigh-high and he does not like to wear them at all. Subsequently he has previously warned stockings on a regular basis but these are just too uncomfortable. He did not have any fever no evidence of cellulitis at that point in time. Subsequently they seem to be more focused on his potassium level during the  stay. Nonetheless the patient was discharged and told to call the wound care center for a follow-up appointment to evaluate his lower extremities. He does have evidence of lymphedema this changes including thickened/scaly skin over the bilateral lower extremities this does appear to be stage III lymphedema. Patient has had a vascular ultrasound for DVT on 12/07/17 and apparently has a repeat evaluation in July. However I did not find venous reflex testing as a part of any of the studies seen through epic. Subsequently his hemoglobin A-1 C was 8.9 as obtained on 11/09/17. Patient states that he has pain and seems to be more consistent with neuropathy but really no significant discomfort otherwise at this point. He does have a history of hypertension, DVT, lymphedema, congestive heart failure, and diabetes mellitus type II. 04/10/18 on evaluation today patient's bilateral lower extremities actually appear to be doing well from the standpoint of ulcers. There do not appear to be any significant issues with his legs and weeping either. In fact they are little bit dry if anything. Overall I'm pleased with how things seem to be progressing. He is scheduled for his vascular appointment upcoming shortly him we had this [facility] is July 8 but he tells me that he is actually seeing them on the 21st. That is of June. This is even better news obviously. The sooner we can determine whether or not the patient can tolerate compression therapy the sooner I think we can get him better. 04/17/18 on evaluation today patient actually appears to be doing very well. Did review the results of his vascular testing which did appear to be normal he had a right ABI of 1.26 with a TBI of 0.95 in the left ABI of 1.30 with a TBI of 0.71. There did not appear to be any evidence of significant arterial disease bilaterally. Overall this is excellent news. He seems to be doing very well  today and in fact he does have some evidence of  the right lower extremity actually doing very well with no openings. He does have several superficial openings in regard to the left lower extremity fortunately this does not appear to be too terrible however there mainly superficial. Electronic Signature(s) Signed: 04/18/2018 12:21:38 AM By: Worthy Keeler PA-C Entered By: Worthy Keeler on 04/17/2018 13:49:56 Stein, [name] ([id number]) -------------------------------------------------------------------------------- Physical Exam Details Patient Name: [name], [name] Date of Service: 04/17/2018 10:30 AM Medical Record Number: [id number] Patient Account Number: [id number] Date of Birth/Sex: 1961-06-06 (57 y.o. M) Treating RN: Montey Hora Primary Care Provider: [name] Other Clinician: Referring Provider: [name] Treating Provider/Extender: [name], Arlynn Stare Weeks in Treatment: 2 Constitutional Obese and well-hydrated in no acute distress. Respiratory normal breathing without difficulty. clear to auscultation bilaterally. Cardiovascular regular rate and rhythm with normal S1, S2. 1+ pitting edema of the bilateral lower extremities. Psychiatric this patient is able to make decisions and demonstrates good insight into disease process. Alert and Oriented x 3. pleasant and cooperative. Notes At this time I'm very pleased with the progress that the patient has made in regard to his wounds. He seems to be tolerating the compression well and now with his normal arterial studies I think is definitely a candidate for good compression stockings. I'd recommend 20 to 30 mmHg range. Overall I think she will likely be healed by next week in regard to left lower extremity she just had some issues this week he tells me with having bumped his leg. Electronic Signature(s) Signed: 04/18/2018 12:21:38 AM By: Worthy Keeler PA-C Entered By: Worthy Keeler on 04/17/2018 13:50:49 [name], [name]  ([id number]) -------------------------------------------------------------------------------- Physician Orders Details Patient Name: [name], [name] Date of Service: 04/17/2018 10:30 AM Medical Record Number: [id number] Patient Account Number: [id number] Date of Birth/Sex: [birth year] (57 y.o. M) Treating RN: Montey Hora Primary Care Provider: [name] Other Clinician: Referring Provider: [name] Treating Provider/Extender: Melburn Hake, Nocole Zammit Weeks in Treatment: 2 Verbal / Phone Orders: No Diagnosis Coding ICD-10 Coding Code Description E11.622 Type 2 diabetes mellitus with other skin ulcer I89.0 Lymphedema, not elsewhere classified L97.811 Non-pressure chronic ulcer of other part of right lower leg limited to breakdown of skin L97.821 Non-pressure chronic ulcer of other part of left lower leg limited to breakdown of skin H96.22 Chronic diastolic (congestive) heart failure E11.40 Type 2 diabetes mellitus with diabetic neuropathy, unspecified I10 Essential (primary) hypertension Wound Cleansing o Cleanse wound with mild soap and water o May Shower, gently pat wound dry prior to applying new dressing. Skin Barriers/Peri-Wound Care o Moisturizing lotion - bilateral legs Primary Wound Dressing Wound #2R Left,Anterior Lower Leg o Silver Alginate Secondary Dressing o Conform/Kerlix Dressing Change Frequency Wound #2R Left,Anterior Lower Leg o Change dressing every week - or if needed Follow-up Appointments o Return Appointment in 1 week. Edema Control o Elevate legs to the level of the heart and pump ankles as often as possible - bilateral legs o Other: - tubigrip bilateral legs bilateral legs Additional Orders / Instructions o Increase protein intake. Electronic Signature(s) Signed: 04/17/2018 5:42:13 PM By: Montey Hora Signed: 04/18/2018 12:21:38 AM By: Worthy Keeler PA-C Entered By: Montey Hora on 04/17/2018 11:32:35 Pollino, Chinonso L.  ([id number]) Claude, Diandre LMarland Kitchen ([id number]) -------------------------------------------------------------------------------- Problem List Details Patient Name: GERDEMAN, Marcellus L. Date of Service: 04/17/2018 10:30 AM Medical Record Number: [id number] Patient Account Number: [id number] Date of Birth/Sex: July 15, 1961 (57 y.o. 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 ([id number]) -------------------------------------------------------------------------------- Progress Note Details Patient Name: [name], [name] Date of Service: 04/17/2018 10:30 AM Medical Record Number: [id number] Patient Account Number: [id number] Date of Birth/Sex: 11-20-1960 (57 y.o. M) Treating RN: Montey Hora Primary Care Provider: [name] Other Clinician: Referring Provider: [name] Treating Provider/Extender: Melburn Hake, Rileigh Kawashima Weeks in Treatment: 2 Subjective Chief Complaint Information obtained from Patient Bilateral  anterior lower leg ulcer History of Present Illness (HPI) 04/03/18 patient presents today for initial evaluation and our clinic as root referral from his emergency department after patient was seen on 03/25/18 due to bilateral leg swelling. He had blistering along with burning discomfort. According to the report patient does take Lasix daily as prescribed as well is a potassium supplement. He has not been wearing his compression stockings and states these are thigh-high and he does not like to wear them at all. Subsequently he has previously warned stockings on a regular basis but these are just too uncomfortable. He did not have any fever no evidence of cellulitis at that point in time. Subsequently they seem to be more focused on his potassium level during the stay. Nonetheless the patient was discharged and told to call the wound care center for a follow-up appointment to evaluate his lower extremities. He does have evidence of lymphedema this changes including thickened/scaly skin over the bilateral lower extremities this does appear to be stage III lymphedema. Patient has had a vascular ultrasound for DVT on 12/07/17 and apparently has a repeat evaluation in July. However I did not find venous reflex testing as a part of any of the studies seen through epic. Subsequently his hemoglobin A-1 C was 8.9 as obtained on 11/09/17. Patient states that he has pain and seems to be more consistent with neuropathy but really no significant discomfort otherwise at this point. He does have a history of hypertension, DVT, lymphedema, congestive heart failure, and diabetes mellitus type II. 04/10/18 on evaluation today patient's bilateral lower extremities actually appear to be doing well from the standpoint of ulcers. There do not appear to be any significant issues with his legs and weeping either. In fact they are little bit dry if anything. Overall I'm pleased with how things seem to be progressing. He is  scheduled for his vascular appointment upcoming shortly him we had this [facility] is July 8 but he tells me that he is actually seeing them on the 21st. That is of June. This is even better news obviously. The sooner we can determine whether or not the patient can tolerate compression therapy the sooner I think we can get him better. 04/17/18 on evaluation today patient actually appears to be doing very well. Did review the results of  his vascular testing which did appear to be normal he had a right ABI of 1.26 with a TBI of 0.95 in the left ABI of 1.30 with a TBI of 0.71. There did not appear to be any evidence of significant arterial disease bilaterally. Overall this is excellent news. He seems to be doing very well today and in fact he does have some evidence of the right lower extremity actually doing very well with no openings. He does have several superficial openings in regard to the left lower extremity fortunately this does not appear to be too terrible however there mainly superficial. Patient History Information obtained from Patient. Family History Cancer - Father,Mother, Diabetes, Seizures - Mother, Stroke - Siblings, No family history of Heart Disease, Hereditary Spherocytosis, Hypertension, Kidney Disease, Lung Disease, Thyroid Problems, Tuberculosis. Social History Never smoker, Marital Status - Single, Alcohol Use - Never, Drug Use - No History, Caffeine Use - Daily. 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 Respiratory The patient has no complaints or symptoms. Cardiovascular Complains or has symptoms of LE edema. Psychiatric The patient has no complaints or symptoms. Objective Constitutional Obese and well-hydrated in no acute  distress. Vitals Time Taken: 10:34 AM, Height: 70 in, Weight: 302 lbs, BMI: 43.3, Temperature: 98.4 F, Pulse: 93 bpm, Respiratory Rate: 18 breaths/min, Blood Pressure: 133/71 mmHg. Respiratory normal breathing without difficulty. clear to auscultation bilaterally. Cardiovascular regular rate and rhythm with normal S1, S2. 1+ pitting edema of the bilateral lower extremities. Psychiatric this patient is able to make decisions and demonstrates good insight into disease process. Alert and Oriented x 3. pleasant and cooperative. General Notes: At this time I'm very pleased with the progress that the patient has made in regard to his wounds. He seems to be tolerating the compression well and now with his normal arterial studies I think is definitely a candidate for good compression stockings. I'd recommend 20 to 30 mmHg range. Overall I think she will likely be healed by next week in regard to left lower extremity she just had some issues this week he tells me with having bumped his leg. Integumentary (Hair, Skin) Wound #2R status is Open. Original cause of wound was Gradually Appeared. The wound is located on the Left,Anterior Lower Leg. The wound measures 4.5cm length x 7cm width x 0.1cm depth; 24.74cm^2 area and 2.474cm^3 volume. There is no tunneling or undermining noted. There is a medium amount of serous drainage noted. The wound margin is distinct with the outline attached to the wound base. There is medium (34-66%) red, pink granulation within the wound bed. There is a medium (34-66%) amount of necrotic tissue within the wound bed including Adherent Slough. The periwound skin appearance exhibited: Dry/Scaly. The periwound skin appearance did not exhibit: Callus, Crepitus, Excoriation, Induration, Rash, Scarring, Maceration, Atrophie Blanche, Cyanosis, Ecchymosis, Hemosiderin Staining, Mottled, Pallor, Rubor, Javed, Reubin L. ([id number]) Erythema. 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 Skin Barriers/Peri-Wound Care: Moisturizing lotion - bilateral legs Primary Wound Dressing: Wound #2R Left,Anterior Lower Leg: Silver Alginate Secondary Dressing: Conform/Kerlix Dressing Change Frequency: Wound #2R Left,Anterior Lower Leg: Change dressing every week - or if needed Follow-up Appointments: Return Appointment in 1 week. Edema Control: Elevate legs to the level of the heart and pump ankles as often as possible - bilateral legs Other: - tubigrip bilateral legs bilateral legs Additional Orders / Instructions: Increase protein intake. At this point again I'm gonna recommend that we go ahead and have him order compression stockings from [facility] in [place]. Again I would recommend the 20-30 mmHg strength. He's in agreement this plan. We will subsequently see were things stand at follow-up when we see him back next week. Hopefully that point you will be completely healed with his compression stockings and we will just have him proceed from there. Please see above for specific wound care orders. We will see patient for re-evaluation in 1 week(s) here in the clinic. If anything worsens or changes patient will contact our office for additional recommendations. [name], [name] ([id number]) Electronic Signature(s) Signed: 04/18/2018 12:21:38 AM By: Worthy Keeler PA-C Entered By: Worthy Keeler on 04/17/2018 13:51:57 [name],  Juan LMarland Kitchen ([id number]) -------------------------------------------------------------------------------- ROS/PFSH Details Patient Name: [name], [name] Date of Service: 04/17/2018 10:30 AM Medical Record Number: [id number] Patient Account Number: [id number] Date of Birth/Sex: 1961-02-09 (57 y.o. M) Treating RN: Montey Hora Primary Care Provider: [name] Other Clinician: Referring Provider: [name] Treating Provider/Extender: Melburn Hake, Daden Mahany Weeks in Treatment: 2 Information Obtained From Patient Wound History Do you currently have one or more open woundso Yes Approximately how long have you had your woundso 2 weeks How have you been treating your wound(s) until nowo nurse is wrapping Has your wound(s) ever healed and then re-openedo No Have you had any lab work done in the past montho Yes Who ordered the lab work [facility]o [facility] Have you tested positive for an antibiotic resistant organism (MRSA, VRE)o No Have you tested positive for osteomyelitis (bone infection)o No Have you had any tests for circulation on your legso No Constitutional Symptoms (General Health) Complaints and Symptoms: Negative for: Fever; Chills Cardiovascular Complaints and Symptoms: Positive for: LE edema Medical History: Positive for: Congestive Heart Failure; Coronary Artery Disease; Deep Vein Thrombosis; Hypertension Negative for: Angina; Arrhythmia; Hypotension; Myocardial Infarction; Peripheral Arterial Disease; Peripheral Venous Disease; Phlebitis; Vasculitis Eyes Medical History: Negative for: Cataracts; Glaucoma; Optic Neuritis Ear/Nose/Mouth/Throat Medical History: Negative for: Chronic sinus problems/congestion; Middle ear problems Hematologic/Lymphatic Medical History: Negative for: Anemia; Hemophilia; Human Immunodeficiency Virus; Lymphedema; Sickle Cell Disease Respiratory Complaints and Symptoms: No Complaints or Symptoms Medical History: Negative for: Aspiration; Asthma;  Chronic Obstructive Pulmonary Disease (COPD); Pneumothorax; Sleep Apnea; Giese, [name] ([id number]) Tuberculosis Gastrointestinal Medical History: Negative for: Cirrhosis ; Colitis; Crohnos; Hepatitis A; Hepatitis B; Hepatitis C Past Medical History Notes: PUD, IBS Endocrine Medical History: Positive for: Type II Diabetes Negative for: Type I Diabetes Treated with: Insulin, Oral agents Genitourinary Medical History: Negative for: End Stage Renal Disease Past Medical History Notes: kidney disease in hospital Immunological Medical History: Negative for: Lupus Erythematosus; Raynaudos; Scleroderma Integumentary (Skin) Medical History: Negative for: History of Burn; History of pressure wounds Musculoskeletal Medical History: Positive for: Osteoarthritis Negative for: Gout; Rheumatoid Arthritis; Osteomyelitis Neurologic Medical History: Positive for: Neuropathy Negative for: Dementia; Quadriplegia; Paraplegia; Seizure Disorder Past Medical History Notes: history of CVA Oncologic Medical History: Negative for: Received Chemotherapy; Received Radiation Psychiatric Complaints and Symptoms:  No Complaints or Symptoms Medical History: MISHAWN, [name] ([id number]) Negative for: Anorexia/bulimia; Confinement Anxiety Immunizations Pneumococcal Vaccine: Received Pneumococcal Vaccination: No Implantable Devices Family and Social History Cancer: Yes - Father,Mother; Diabetes: Yes; Heart Disease: No; Hereditary Spherocytosis: No; Hypertension: No; Kidney Disease: No; Lung Disease: No; Seizures: Yes - Mother; Stroke: Yes - Siblings; Thyroid Problems: No; Tuberculosis: No; Never smoker; Marital Status - Single; Alcohol Use: Never; Drug Use: No History; Caffeine Use: Daily; Financial Concerns: No; Food, Clothing or Shelter Needs: No; Support System Lacking: No; Transportation Concerns: No; Advanced Directives: No; Patient does not want information on Advanced  Directives Physician Affirmation I have reviewed and agree with the above information. Electronic Signature(s) Signed: 04/17/2018 5:42:13 PM By: Montey Hora Signed: 04/18/2018 12:21:38 AM By: Worthy Keeler PA-C Entered By: Worthy Keeler on 04/17/2018 13:50:15 Venard, Jagger L. ([id number]) -------------------------------------------------------------------------------- SuperBill Details Patient Name: [name], Napoleon L. Date of Service: 04/17/2018 Medical Record Number: [id number] Patient Account Number: [id number] Date of Birth/Sex: 12/05/1960 (57 y.o. M) Treating RN: Montey Hora Primary Care Provider: [name] Other Clinician: Referring Provider: [name] Treating Provider/Extender: Melburn Hake, Waldemar Siegel Weeks in Treatment: 2 Diagnosis Coding ICD-10 Codes Code Description E11.622 Type 2 diabetes mellitus with other skin ulcer I89.0 Lymphedema, not elsewhere classified L97.811 Non-pressure chronic ulcer of other part of right lower leg limited to breakdown of skin L97.821 Non-pressure chronic ulcer of other part of left lower leg limited to breakdown of skin F54.36 Chronic diastolic (congestive) heart failure E11.40 Type 2 diabetes mellitus with diabetic neuropathy, unspecified I10 Essential (primary) hypertension Facility Procedures CPT4 Code: [id number] Description: 99213 - WOUND CARE VISIT-LEV 3 EST PT Modifier: Quantity: 1 Physician Procedures CPT4 Code Description: [id number] - WC PHYS LEVEL 3 - EST PT ICD-10 Diagnosis Description E11.622 Type 2 diabetes mellitus with other skin ulcer I89.0 Lymphedema, not elsewhere classified L97.811 Non-pressure chronic ulcer of other part of right lower  leg lim L97.821 Non-pressure chronic ulcer of other part of left lower leg limi Modifier: ited to breakdown ted to breakdown Quantity: 1 of skin of skin Electronic Signature(s) Signed: 04/18/2018 12:21:38 AM By: Worthy Keeler PA-C Entered By: Worthy Keeler on 04/17/2018  13:52:12

## 2018-04-23 ENCOUNTER — Encounter: Payer: Self-pay | Admitting: *Deleted

## 2018-04-23 ENCOUNTER — Telehealth: Payer: Self-pay | Admitting: Neurology

## 2018-04-23 NOTE — Telephone Encounter (Signed)
Dr. Krista Blue is aware and she is not dismissing him from our practice. However, he does not need to schedule a follow up with her unless a new neurological need arises.  Per vo by Dr. Krista Blue, he can discontinue B12 injections and convert to a daily oral supplement of 1064mcg daily.  He should request his PCP to take over his duloxetine prescription and continuation of his B12 monitoring.  Pt was agreeable to this plan and appreciative of call.

## 2018-04-23 NOTE — Telephone Encounter (Signed)
Pt called to r/s the appt he missed on 6/27. He questioned if he could r/s it. I advised him there have been 3 no shows since 12/18 and would need to send message to RN. I did explain the no show policy to him. Please advise

## 2018-04-24 ENCOUNTER — Other Ambulatory Visit: Payer: Self-pay

## 2018-04-24 ENCOUNTER — Encounter: Payer: Medicaid Other | Attending: Physician Assistant | Admitting: Physician Assistant

## 2018-04-24 ENCOUNTER — Ambulatory Visit (HOSPITAL_COMMUNITY)
Admission: EM | Admit: 2018-04-24 | Discharge: 2018-04-24 | Disposition: A | Discharge status: Home / Self Care | Payer: Medicaid Other | Attending: Family Medicine | Admitting: Family Medicine

## 2018-04-24 ENCOUNTER — Encounter (HOSPITAL_COMMUNITY): Payer: Self-pay | Admitting: Emergency Medicine

## 2018-04-24 DIAGNOSIS — Z09 Encounter for follow-up examination after completed treatment for conditions other than malignant neoplasm: Secondary | ICD-10-CM | POA: Insufficient documentation

## 2018-04-24 DIAGNOSIS — I5032 Chronic diastolic (congestive) heart failure: Secondary | ICD-10-CM | POA: Diagnosis not present

## 2018-04-24 DIAGNOSIS — Z8673 Personal history of transient ischemic attack (TIA), and cerebral infarction without residual deficits: Secondary | ICD-10-CM | POA: Insufficient documentation

## 2018-04-24 DIAGNOSIS — I110 Hypertensive heart disease with heart failure: Secondary | ICD-10-CM | POA: Insufficient documentation

## 2018-04-24 DIAGNOSIS — Z8631 Personal history of diabetic foot ulcer: Secondary | ICD-10-CM | POA: Diagnosis not present

## 2018-04-24 DIAGNOSIS — I890 Lymphedema, not elsewhere classified: Secondary | ICD-10-CM | POA: Diagnosis not present

## 2018-04-24 DIAGNOSIS — M25551 Pain in right hip: Secondary | ICD-10-CM

## 2018-04-24 DIAGNOSIS — E1140 Type 2 diabetes mellitus with diabetic neuropathy, unspecified: Secondary | ICD-10-CM | POA: Insufficient documentation

## 2018-04-24 MED ORDER — METHYLPREDNISOLONE ACETATE 80 MG/ML IJ SUSP
INTRAMUSCULAR | Status: AC
  Filled 2018-04-24: qty 1

## 2018-04-24 MED ORDER — BUPIVACAINE HCL (PF) 0.5 % IJ SOLN
INTRAMUSCULAR | Status: AC
  Filled 2018-04-24: qty 10

## 2018-04-24 NOTE — ED Provider Notes (Signed)
[place]    CSN: [id number] Arrival date & time: 04/24/18  1149     History   Chief Complaint Chief Complaint  Patient presents with  . Hip Pain    HPI [name] is a 57 y.o. male.  Patient complains of hip pain today.  He has a history of lumbar disc disease as well as degenerative changes in the hip.  This pain radiates down to the knee and also hurts in the groin.  Pain localizes mostly though over the lateral aspect of the hip.   HPI  Past Medical History:  Diagnosis Date  . Arthritis   . Back pain   . CAD (coronary artery disease)    a. s/p DES to LAD in 05/2016  . Cervical radiculopathy   . Chronic diastolic CHF (congestive heart failure) ([place])   . Chronic pain   . Depression   . DVT (deep venous thrombosis) ([place])   . Hematemesis   . Hepatic steatosis   . Hyperlipidemia   . Hypertension   . IBS (irritable bowel syndrome)   . Morbid obesity ([place])   . OSA (obstructive sleep apnea)   . Pancreatitis   . PE (pulmonary thromboembolism) ([place])   . Peripheral neuropathy   . PUD (peptic ulcer disease)   . Renal disorder   . Stroke [facility])    a. ?details unclear - not seen on imaging when he was admitted in 05/2017 for TIA symptoms which were felt due to cervical radiculopathy.  . Thoracic aortic ectasia (HCC)    a. 4.3cm ectatic ascending thoracic aorta by CT 06/2017.   . Type 2 diabetes mellitus [facility])     Patient Active Problem List   Diagnosis Date Noted  . B12 deficiency 08/10/2017  . Persistent headaches 08/09/2017  . GERD (gastroesophageal reflux disease) 06/29/2017  . Left arm numbness   . Cerebral embolism with cerebral infarction 06/17/2017  . TIA (transient ischemic attack) 06/17/2017  . Chronic back pain 12/29/2016  . Depression 12/15/2016  . Vitamin D deficiency 12/09/2016  . Right hip pain 12/07/2016  . Hypokalemia 12/07/2016  . Type 2 diabetes mellitus with vascular disease ([place]) 05/31/2016  . Normocytic normochromic anemia  05/31/2016  . Chest pain 05/31/2016  . CAD (coronary artery disease) 01/06/2016  . DVT (deep venous thrombosis) ([place]) 01/06/2016  . Lactic acidosis 05/28/2014  . Nonspecific chest pain 01/29/2014  . Uncontrolled secondary diabetes with peripheral neuropathy ([place]) 01/29/2014  . Obesity, Class III, BMI 40-49.9 (morbid obesity) ([place]) 01/29/2014  . Snoring 01/29/2014  . Dyslipidemia 01/29/2014  . HTN (hypertension) 01/29/2014  . Abnormal nuclear stress test 01/29/2014    Past Surgical History:  Procedure Laterality Date  . CARDIAC CATHETERIZATION N/A 05/31/2016   Procedure: Left Heart Cath and Coronary Angiography;  Surgeon: Peter M Martinique, MD;  Location: [place] CV [facility];  Service: Cardiovascular;  Laterality: N/A;  . CARDIAC CATHETERIZATION N/A 05/31/2016   Procedure: Intravascular Pressure Wire/FFR Study;  Surgeon: Peter M Martinique, MD;  Location: [place] [facility];  Service: Cardiovascular;  Laterality: N/A;  . CARDIAC CATHETERIZATION N/A 05/31/2016   Procedure: Coronary Stent Intervention;  Surgeon: Peter M Martinique, MD;  Location: [place] [facility] LAB;  Service: Cardiovascular;  Laterality: N/A;  . LEFT HEART CATH AND CORONARY ANGIOGRAPHY N/A 12/08/2017   Procedure: LEFT HEART CATH AND CORONARY ANGIOGRAPHY;  Surgeon: Leonie Man, MD;  Location: [place] [facility];  Service: Cardiovascular;  Laterality: N/A;  . LEFT HEART CATHETERIZATION WITH CORONARY  ANGIOGRAM N/A 02/03/2014   Procedure: LEFT HEART CATHETERIZATION WITH CORONARY ANGIOGRAM;  Surgeon: Pixie Casino, MD;  Location: [facility] CATH LAB;  Service: Cardiovascular;  Laterality: N/A;  . left leg stent          Home Medications    Prior to Admission medications   Medication Sig Start Date End Date Taking? Authorizing Provider  aspirin 81 MG chewable tablet Chew 1 tablet (81 mg total) by mouth daily. 06/01/16   Elwin Mocha, MD  atorvastatin (LIPITOR) 80 MG tablet Take 1 tablet (80 mg total) by mouth daily. 01/26/17   Ahmed Prima,  Fransisco Hertz, PA-C  carvedilol (COREG) 25 MG tablet Take 1 tablet (25 mg total) by mouth 2 (two) times daily with a meal. 01/26/17   Strader, [place] M, PA-C  diclofenac sodium (VOLTAREN) 1 % GEL Apply 1 application topically 4 (four) times daily. 04/05/18   Melynda Ripple, MD  doxycycline (VIBRAMYCIN) 100 MG capsule Take 1 capsule (100 mg total) by mouth 2 (two) times daily. 04/01/18   Glyn Ade, PA-C  DULoxetine (CYMBALTA) 60 MG capsule Take 1 capsule (60 mg total) by mouth daily. 03/08/18   Marcial Pacas, MD  famotidine (PEPCID) 40 MG tablet Take 1 tablet (40 mg total) by mouth at bedtime. Patient taking differently: Take 40 mg by mouth daily.  06/18/17   Barton Dubois, MD  furosemide (LASIX) 40 MG tablet Take 1 tablet (40 mg total) by mouth every evening. 12/09/17   Velvet Bathe, MD  furosemide (LASIX) 80 MG tablet Take 1 tablet (80 mg total) by mouth daily. 12/10/17   Velvet Bathe, MD  glipiZIDE (GLUCOTROL) 10 MG tablet Take 10 mg by mouth 2 (two) times daily. 05/30/17   [provider]  hydrALAZINE (APRESOLINE) 25 MG tablet Take 3 tablets (75 mg total) by mouth 3 (three) times daily. 01/30/18   Barrett, Evelene Croon, PA-C  hydrALAZINE (APRESOLINE) 50 MG tablet TAKE 1 TABLET (50 MG TOTAL) BY MOUTH 3 (THREE) TIMES DAILY. 03/27/18   Lelon Perla, MD  Insulin Detemir (LEVEMIR) 100 UNIT/ML Pen Inject 20 Units into the skin daily at 10 pm. Patient taking differently: Inject 55 Units into the skin daily at 10 pm.  06/18/17   Barton Dubois, MD  Insulin Pen Needle 31G X 5 MM MISC Use 1 needle daily to inject insulin as prescribed 06/18/17   Barton Dubois, MD  isosorbide dinitrate (ISORDIL) 30 MG tablet Take 1 tablet (30 mg total) by mouth every morning. 12/10/17   Velvet Bathe, MD  lisinopril (PRINIVIL,ZESTRIL) 10 MG tablet Take 1 tablet (10 mg total) by mouth daily. 12/10/17   Velvet Bathe, MD  LYRICA 100 MG capsule Take 100 mg by mouth 2 (two) times daily. 11/29/17   [provider]    metFORMIN (GLUCOPHAGE-XR) 500 MG 24 hr tablet Take 2 tablets (1,000 mg total) by mouth 2 (two) times daily. 07/02/17   Barton Dubois, MD  methocarbamol (ROBAXIN) 750 MG tablet Take 1 tablet (750 mg total) by mouth every 4 (four) hours. 04/05/18   Melynda Ripple, MD  nitroGLYCERIN (NITROSTAT) 0.4 MG SL tablet Place 0.4 mg under the tongue every 5 (five) minutes as needed for chest pain. 12/17/16   [provider]  oxyCODONE (ROXICODONE) 15 MG immediate release tablet Take 15 mg 3 (three) times daily as needed by mouth for pain.  08/28/17   [provider]  pantoprazole (PROTONIX) 40 MG tablet Take 1 tablet (40 mg total) by mouth 2 (two) times daily.  06/18/17   Barton Dubois, MD  potassium chloride SA (K-DUR,KLOR-CON) 20 MEQ tablet Take 40 mEq by mouth daily.     [provider]  propranolol (INDERAL) 80 MG tablet Take 80 mg by mouth.    [provider]  sertraline (ZOLOFT) 50 MG tablet Take 50 mg by mouth daily.    [provider]  sitaGLIPtin (JANUVIA) 100 MG tablet Take 100 mg by mouth daily.    [provider]  terbinafine (LAMISIL) 250 MG tablet Take 250 mg by mouth daily. 12/07/17   [provider]  traZODone (DESYREL) 50 MG tablet Take 1 tablet (50 mg total) by mouth at bedtime. 12/15/16   Burns, Arloa Koh, MD  vitamin B-12 (CYANOCOBALAMIN) 1000 MCG tablet Take 1,000 mcg by mouth daily.    [provider]  Vitamin D, Ergocalciferol, (DRISDOL) 50000 units CAPS capsule Take 1 capsule by mouth every Monday. 11/06/17   [provider]    Family History Family History  Problem Relation Age of Onset  . Cancer Father   . Hypertension Mother   . Diabetes Mother   . Breast cancer Mother   . Hypertension Brother   . Diabetes Brother   . Hypertension Sister   . Diabetes Sister     Social History Social History   Tobacco Use  . Smoking status: Never Smoker  . Smokeless tobacco: Never Used  Substance Use Topics  .  Alcohol use: No  . Drug use: No     Allergies   Coconut flavor [flavoring agent]; Coconut oil; Ibuprofen; Aleve [naproxen]; and Nsaids   Review of Systems Review of Systems  Constitutional: Negative.   HENT: Negative.   Respiratory: Negative.   Cardiovascular: Negative.   Musculoskeletal: Positive for arthralgias.     Physical Exam Triage Vital Signs ED Triage Vitals  Enc Vitals Group     BP 04/24/18 1200 127/64     Pulse Rate 04/24/18 1200 91     Resp 04/24/18 1200 18     Temp 04/24/18 1200 98.6 F (37 C)     Temp Source 04/24/18 1200 Oral     SpO2 04/24/18 1200 97 %     Weight --      Height --      Head Circumference --      Peak Flow --      Pain Score 04/24/18 1159 9     Pain Loc --      Pain Edu? --      Excl. in [place]? --    No data found.  Updated Vital Signs BP 127/64 (BP Location: Left Arm)   Pulse 91   Temp 98.6 F (37 C) (Oral)   Resp 18   SpO2 97%   Visual Acuity Right Eye Distance:   Left Eye Distance:   Bilateral Distance:    Right Eye Near:   Left Eye Near:    Bilateral Near:     Physical Exam  Constitutional: He appears well-developed and well-nourished.  Cardiovascular: Normal rate.  Musculoskeletal:  Patient has tenderness over the greater trochanter of the right hip.  Straight leg raising is positive.  Unable to check reflexes since he has a brace on the right knee.     UC Treatments / Results  Labs (all labs ordered are listed, but only abnormal results are displayed) Labs Reviewed - No data to display  EKG None  Radiology No results found.  Procedures Procedures (including critical care time)  Medications Ordered in  UC Medications - No data to display  Initial Impression / Assessment and Plan / UC Course  I have reviewed the triage vital signs and the nursing notes.  Pertinent labs & imaging results that were available during my care of the patient were reviewed by me and considered in my medical decision making  (see chart for details).     Right hip pain, likely trochanteric bursitis.  Injection with Depo-Medrol and Marcaine performed.  Patient experienced some immediate relief from Marcaine.  He does have follow-up appointment with orthopedics in about 1 month Final Clinical Impressions(s) / UC Diagnoses   Final diagnoses:  None   Discharge Instructions   None    ED Prescriptions    None     Controlled Substance Prescriptions [place] Controlled Substance Registry consulted? No   Wardell Honour, MD 04/24/18 1244

## 2018-04-24 NOTE — ED Triage Notes (Signed)
The patient presented to the [facility] with a complaint of chronic right side hip pain. The patient requested a toradol injection.

## 2018-04-25 NOTE — Progress Notes (Signed)
GLENDALE, YOUNGBLOOD ([id number]) Visit Report for 04/24/2018 Chief Complaint Document Details Patient Name: [name], [name]. Date of Service: 04/24/2018 11:00 AM Medical Record Number: [id number] Patient Account Number: [id number] Date of Birth/Sex: Mar 29, 1961 (57 y.o. M) Treating RN: [name] Primary Care Provider: Windy Stein Other Clinician: Referring Provider: Windy Stein Treating Provider/Extender: [name], [name] in Treatment: 3 Information Obtained from: Patient Chief Complaint Bilateral anterior lower leg ulcer Electronic Signature(s) Signed: 04/25/2018 1:22:53 AM By: Worthy Keeler PA-C Entered By: Worthy Stein on 04/24/2018 11:11:55 Stein, [name]. ([id number]) -------------------------------------------------------------------------------- HPI Details Patient Name: [name], [name]. Date of Service: 04/24/2018 11:00 AM Medical Record Number: [id number] Patient Account Number: [id number] Date of Birth/Sex: 19-Jan-1961 (57 y.o. M) Treating RN: [name] Primary Care Provider: Windy Stein Other Clinician: Referring Provider: Windy Stein Treating Provider/Extender: [name], [name] in Treatment: 3 History of Present Illness HPI Description: 04/03/18 patient presents today for initial evaluation and our clinic as root referral from his emergency department after patient was seen on 03/25/18 due to bilateral leg swelling. He had blistering along with burning discomfort. According to the report patient does take Lasix daily as prescribed as well is a potassium supplement. He has not been wearing his compression stockings and states these are thigh-high and he does not like to wear them at all. Subsequently he has previously warned stockings on a regular basis but these are just too uncomfortable. He did not have any fever no evidence of cellulitis at that point in time. Subsequently they seem to be more focused on his potassium level during the  stay. Nonetheless the patient was discharged and told to call the wound care center for a follow-up appointment to evaluate his lower extremities. He does have evidence of lymphedema this changes including thickened/scaly skin over the bilateral lower extremities this does appear to be stage III lymphedema. Patient has had a vascular ultrasound for DVT on 12/07/17 and apparently has a repeat evaluation in July. However I did not find venous reflex testing as a part of any of the studies seen through epic. Subsequently his hemoglobin A-1 C was 8.9 as obtained on 11/09/17. Patient states that he has pain and seems to be more consistent with neuropathy but really no significant discomfort otherwise at this point. He does have a history of hypertension, DVT, lymphedema, congestive heart failure, and diabetes mellitus type II. 04/10/18 on evaluation today patient's bilateral lower extremities actually appear to be doing well from the standpoint of ulcers. There do not appear to be any significant issues with his legs and weeping either. In fact they are little bit dry if anything. Overall I'm pleased with how things seem to be progressing. He is scheduled for his vascular appointment upcoming shortly him we had this [name] is July 8 but he tells me that he is actually seeing them on the 21st. That is of June. This is even better news obviously. The sooner we can determine whether or not the patient can tolerate compression therapy the sooner I think we can get him better. 04/17/18 on evaluation today patient actually appears to be doing very well. Did review the results of his vascular testing which did appear to be normal he had a right ABI of 1.26 with a TBI of 0.95 in the left ABI of 1.30 with a TBI of 0.71. There did not appear to be any evidence of significant arterial disease bilaterally. Overall this is excellent news. He seems to be doing very well  today and in fact he does have some evidence of  the right lower extremity actually doing very well with no openings. He does have several superficial openings in regard to the left lower extremity fortunately this does not appear to be too terrible however there mainly superficial. 04/24/18 on evaluation today patient appears to be doing rather well in regard to his bilateral lower extremities. He has been tolerating the dressing changes without complication and there does not appear to be any evidence of infection. The good news is his wounds actually all appear to be completely healed which is great. He states that he's actually going to take the measurements and got [place] to get his compression stockings today. I think this is definitely going to be the best thing for him although he can continue to wear the Tubigrip until he gets them. Electronic Signature(s) Signed: 04/25/2018 1:22:53 AM By: Worthy Keeler PA-C Entered By: Worthy Stein on 04/24/2018 11:12:06 [name], Terril LMarland Kitchen ([id number]) -------------------------------------------------------------------------------- Physical Exam Details Patient Name: [name], [name]. Date of Service: 04/24/2018 11:00 AM Medical Record Number: [id number] Patient Account Number: [id number] Date of Birth/Sex: 1961-07-04 (57 y.o. M) Treating RN: [name] Primary Care Provider: Windy Stein Other Clinician: Referring Provider: Windy Stein Treating Provider/Extender: [name], [name] in Treatment: 3 Constitutional Well-nourished and well-hydrated in no acute distress. Respiratory normal breathing without difficulty. Cardiovascular 1+ pitting edema of the bilateral lower extremities. Psychiatric this patient is able to make decisions and demonstrates good insight into disease process. Alert and Oriented x 3. pleasant and cooperative. Notes On evaluation today patient seems to be doing excellent in regard to his lower extremity ulcers which are all completely healed. Overall his  swelling seems to be decent although I do think she could tolerate and do better with stronger compression at this point he is going to get compression stockings today he tells me. Electronic Signature(s) Signed: 04/25/2018 1:22:53 AM By: Worthy Keeler PA-C Entered By: Worthy Stein on 04/24/2018 11:13:08 Bracknell, Isaac Carlean Jews ([id number]) -------------------------------------------------------------------------------- Physician Orders Details Patient Name: [name], [name]. Date of Service: 04/24/2018 11:00 AM Medical Record Number: [id number] Patient Account Number: [id number] Date of Birth/Sex: 1960/10/31 (57 y.o. M) Treating RN: [name] Primary Care Provider: Windy Stein Other Clinician: Referring Provider: Windy Stein Treating Provider/Extender: [name], [name] in Treatment: 3 Verbal / Phone Orders: Yes Clinician: Carolyne Fiscal, Debi Read Back and Verified: Yes Diagnosis Coding Discharge From [facility] o Discharge from [place] - Use lotion on legs at night before bed. Wear your compression stockings daily, put them on fist thing in the morning and take them off at night. Please call our office if you have any questions or concerns. Electronic Signature(s) Signed: 04/24/2018 4:10:39 PM By: Alric Quan Signed: 04/25/2018 1:22:53 AM By: Worthy Keeler PA-C Entered By: Alric Quan on 04/24/2018 11:09:42 Below, [place] ([id number]) -------------------------------------------------------------------------------- Problem List Details Patient Name: [name], [name]. Date of Service: 04/24/2018 11:00 AM Medical Record Number: [id number] Patient Account Number: [id number] Date of Birth/Sex: May 25, 1961 (57 y.o. M) Treating RN: [name] Primary Care Provider: Windy Stein Other Clinician: Referring Provider: Windy Stein Treating Provider/Extender: [name], [name] in Treatment: 3 Active Problems ICD-10 Evaluated Encounter Code  Description Active Date Today Diagnosis E11.622 Type 2 diabetes mellitus with other skin ulcer 04/03/2018 No Yes I89.0 Lymphedema, not elsewhere classified 04/03/2018 No Yes L97.811 Non-pressure chronic ulcer of other part of right lower leg 04/03/2018 No Yes limited to breakdown of skin  E93.810 Non-pressure chronic ulcer of other part of left lower leg 04/03/2018 No Yes limited to breakdown of skin F75.10 Chronic diastolic (congestive) heart failure 04/03/2018 No Yes E11.40 Type 2 diabetes mellitus with diabetic neuropathy, 04/03/2018 No Yes unspecified I10 Essential (primary) hypertension 04/03/2018 No Yes Inactive Problems Resolved Problems Electronic Signature(s) Signed: 04/25/2018 1:22:53 AM By: Worthy Keeler PA-C Entered By: Worthy Stein on 04/24/2018 11:11:46 [name], [name]. ([id number]) -------------------------------------------------------------------------------- Progress Note Details Patient Name: [name], [name]. Date of Service: 04/24/2018 11:00 AM Medical Record Number: [id number] Patient Account Number: [id number] Date of Birth/Sex: 1961/05/07 (57 y.o. M) Treating RN: [name] Primary Care Provider: Windy Stein Other Clinician: Referring Provider: Windy Stein Treating Provider/Extender: [name], [name] in Treatment: 3 Subjective Chief Complaint Information obtained from Patient Bilateral anterior lower leg ulcer History of Present Illness (HPI) 04/03/18 patient presents today for initial evaluation and our clinic as root referral from his emergency department after patient was seen on 03/25/18 due to bilateral leg swelling. He had blistering along with burning discomfort. According to the report patient does take Lasix daily as prescribed as well is a potassium supplement. He has not been wearing his compression stockings and states these are thigh-high and he does not like to wear them at all. Subsequently he has previously warned stockings on a  regular basis but these are just too uncomfortable. He did not have any fever no evidence of cellulitis at that point in time. Subsequently they seem to be more focused on his potassium level during the stay. Nonetheless the patient was discharged and told to call the wound care center for a follow-up appointment to evaluate his lower extremities. He does have evidence of lymphedema this changes including thickened/scaly skin over the bilateral lower extremities this does appear to be stage III lymphedema. Patient has had a vascular ultrasound for DVT on 12/07/17 and apparently has a repeat evaluation in July. However I did not find venous reflex testing as a part of any of the studies seen through epic. Subsequently his hemoglobin A-1 C was 8.9 as obtained on 11/09/17. Patient states that he has pain and seems to be more consistent with neuropathy but really no significant discomfort otherwise at this point. He does have a history of hypertension, DVT, lymphedema, congestive heart failure, and diabetes mellitus type II. 04/10/18 on evaluation today patient's bilateral lower extremities actually appear to be doing well from the standpoint of ulcers. There do not appear to be any significant issues with his legs and weeping either. In fact they are little bit dry if anything. Overall I'm pleased with how things seem to be progressing. He is scheduled for his vascular appointment upcoming shortly him we had this [name] is July 8 but he tells me that he is actually seeing them on the 21st. That is of June. This is even better news obviously. The sooner we can determine whether or not the patient can tolerate compression therapy the sooner I think we can get him better. 04/17/18 on evaluation today patient actually appears to be doing very well. Did review the results of his vascular testing which did appear to be normal he had a right ABI of 1.26 with a TBI of 0.95 in the left ABI of 1.30 with a TBI of  0.71. There did not appear to be any evidence of significant arterial disease bilaterally. Overall this is excellent news. He seems to be doing very well today and in fact he does have some evidence  of the right lower extremity actually doing very well with no openings. He does have several superficial openings in regard to the left lower extremity fortunately this does not appear to be too terrible however there mainly superficial. 04/24/18 on evaluation today patient appears to be doing rather well in regard to his bilateral lower extremities. He has been tolerating the dressing changes without complication and there does not appear to be any evidence of infection. The good news is his wounds actually all appear to be completely healed which is great. He states that he's actually going to take the measurements and got [place] to get his compression stockings today. I think this is definitely going to be the best thing for him although he can continue to wear the Tubigrip until he gets them. Patient History Information obtained from Patient. Family History Cancer - Father,Mother, Diabetes, Seizures - Mother, Stroke - Siblings, No family history of Heart Disease, Hereditary Spherocytosis, Hypertension, Kidney Disease, Lung Disease, Thyroid [name], [name]. ([id number]) Problems, Tuberculosis. Social History Never smoker, Marital Status - Single, Alcohol Use - Never, Drug Use - No History, Caffeine Use - Daily. 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 Respiratory The patient has no complaints or symptoms. Cardiovascular Complains or has symptoms of LE edema. Psychiatric The patient has no complaints or symptoms. Objective Constitutional Well-nourished and well-hydrated in no acute  distress. Vitals Time Taken: 10:54 AM, Height: 70 in, Weight: 302 lbs, BMI: 43.3, Temperature: 98.1 F, Pulse: 93 bpm, Respiratory Rate: 18 breaths/min, Blood Pressure: 129/64 mmHg. Respiratory normal breathing without difficulty. Cardiovascular 1+ pitting edema of the bilateral lower extremities. Psychiatric this patient is able to make decisions and demonstrates good insight into disease process. Alert and Oriented x 3. pleasant and cooperative. General Notes: On evaluation today patient seems to be doing excellent in regard to his lower extremity ulcers which are all completely healed. Overall his swelling seems to be decent although I do think she could tolerate and do better with stronger compression at this point he is going to get compression stockings today he tells me. Integumentary (Hair, Skin) Wound #2R status is Open. Original cause of wound was Gradually Appeared. The wound is located on the Left,Anterior Lower Leg. The wound measures 0cm length x 0cm width x 0cm depth; 0cm^2 area and 0cm^3 volume. 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 Wear your compression stockings daily, put them on fist thing in the morning and take them off at night. Please call our office if you have any questions or concerns. At this point we will discontinue wound care services and we will see were things stand at follow-up in one Stein time. Patient is in agreement  with that plan. If anything changes or worsens he will contact the office and let us know otherwise his only question he really had for me was whether or not the skin changes that he is  seeing will get better that is reversed. I explained that reversal of the skin changes can happen although the only way that's gonna happen is with good compression in time. With that being said is at the stage where it's gonna be difficult to know how much will change or not just depending on how things progress. He understands. He knows to wears compression stockings from when he gets up first thing in the morning until he goes to bed. Electronic Signature(s) Signed: 04/25/2018 1:22:53 AM By: Worthy Keeler PA-C Entered By: Worthy Stein on 04/24/2018 11:13:44 [name], Juan LMarland Kitchen ([id number]) -------------------------------------------------------------------------------- ROS/PFSH Details Patient Name: [name], Carden Stein. Date of Service: 04/24/2018 11:00 AM Medical Record Number: [id number] Patient Account Number: [id number] Date of Birth/Sex: November 27, 1960 (57 y.o. M) Treating RN: [name] Primary Care Provider: Windy Stein Other Clinician: Referring Provider: Windy Stein Treating Provider/Extender: [name], [name] in Treatment: 3 Information Obtained From Patient Wound History Do you currently have one or more open woundso Yes Approximately how long have you had your woundso 2 Stein How have you been treating your wound(s) until nowo nurse is wrapping Has your wound(s) ever healed and then re-openedo No Have you had any lab work done in the past montho Yes Who ordered the lab work doneo [facility] Have you tested positive for an antibiotic resistant organism (MRSA, VRE)o No Have you tested positive for osteomyelitis (bone infection)o No Have you had any tests for circulation on your legso No Constitutional Symptoms (General Health) Complaints and Symptoms: Negative for: Fever;  Chills Cardiovascular Complaints and Symptoms: Positive for: LE edema Medical History: Positive for: Congestive Heart Failure; Coronary Artery Disease; Deep Vein Thrombosis; Hypertension Negative for: Angina; Arrhythmia; Hypotension; Myocardial Infarction; Peripheral Arterial Disease; Peripheral Venous Disease; Phlebitis; Vasculitis Eyes Medical History: Negative for: Cataracts; Glaucoma; Optic Neuritis Ear/Nose/Mouth/Throat Medical History: Negative for: Chronic sinus problems/congestion; Middle ear problems Hematologic/Lymphatic Medical History: Negative for: Anemia; Hemophilia; Human Immunodeficiency Virus; Lymphedema; Sickle Cell Disease Respiratory Complaints and Symptoms: No Complaints or Symptoms Medical History: Negative for: Aspiration; Asthma; Chronic Obstructive Pulmonary Disease (COPD); Pneumothorax; Sleep Apnea; Gunning, Gianni Stein. ([id number]) Tuberculosis Gastrointestinal Medical History: Negative for: Cirrhosis ; Colitis; Crohnos; Hepatitis A; Hepatitis B; Hepatitis C Past Medical History Notes: PUD, IBS Endocrine Medical History: Positive for: Type II Diabetes Negative for: Type I Diabetes Treated with: Insulin, Oral agents Genitourinary Medical History: Negative for: End Stage Renal Disease Past Medical History Notes: kidney disease in hospital Immunological Medical History: Negative for: Lupus Erythematosus; Raynaudos; Scleroderma Integumentary (Skin) Medical History: Negative for: History of Burn; History of pressure wounds Musculoskeletal Medical History: Positive for: Osteoarthritis Negative for: Gout; Rheumatoid Arthritis; Osteomyelitis Neurologic Medical History: Positive for: Neuropathy Negative for: Dementia; Quadriplegia; Paraplegia; Seizure Disorder Past Medical History Notes: history of CVA Oncologic Medical History: Negative for: Received Chemotherapy; Received Radiation Psychiatric Complaints and Symptoms: No Complaints or  Symptoms Medical History: LAYKEN, DOENGES ([id number]) Negative for: Anorexia/bulimia; Confinement Anxiety Immunizations Pneumococcal Vaccine: Received Pneumococcal Vaccination: No Implantable Devices Family and Social History Cancer: Yes - Father,Mother; Diabetes: Yes; Heart Disease: No; Hereditary Spherocytosis: No; Hypertension: No; Kidney Disease: No; Lung Disease: No; Seizures: Yes - Mother; Stroke: Yes - Siblings; Thyroid Problems: No; Tuberculosis: No; Never smoker; Marital Status - Single; Alcohol Use: Never; Drug Use: No History; Caffeine Use: Daily; Financial Concerns: No; Food, Clothing or Shelter Needs: No; Support System Lacking: No; Transportation Concerns: No; Advanced Directives: No; Patient does not want information on Advanced Directives Physician Affirmation I have reviewed and agree with  the above information. Electronic Signature(s) Signed: 04/24/2018 4:10:39 PM By: Alric Quan Signed: 04/25/2018 1:22:53 AM By: Worthy Keeler PA-C Entered By: Worthy Stein on 04/24/2018 11:12:31 Zetina, Edelmiro Stein. ([id number]) -------------------------------------------------------------------------------- SuperBill Details Patient Name: [name], Kayson Stein. Date of Service: 04/24/2018 Medical Record Number: [id number] Patient Account Number: [id number] Date of Birth/Sex: Jul 05, 1961 (57 y.o. M) Treating RN: [name] Primary Care Provider: Windy Stein Other Clinician: Referring Provider: Windy Stein Treating Provider/Extender: [name], [name] in Treatment: 3 Diagnosis Coding ICD-10 Codes Code Description E11.622 Type 2 diabetes mellitus with other skin ulcer I89.0 Lymphedema, not elsewhere classified L97.811 Non-pressure chronic ulcer of other part of right lower leg limited to breakdown of skin L97.821 Non-pressure chronic ulcer of other part of left lower leg limited to breakdown of skin G40.10 Chronic diastolic (congestive) heart failure E11.40 Type 2  diabetes mellitus with diabetic neuropathy, unspecified I10 Essential (primary) hypertension Facility Procedures CPT4 Code: [id number] Description: [phone] - WOUND CARE VISIT-LEV 2 EST PT Modifier: Quantity: 1 Physician Procedures CPT4 Code Description: [id number] - WC PHYS LEVEL 2 - EST PT ICD-10 Diagnosis Description E11.622 Type 2 diabetes mellitus with other skin ulcer I89.0 Lymphedema, not elsewhere classified L97.811 Non-pressure chronic ulcer of other part of right lower  leg lim L97.821 Non-pressure chronic ulcer of other part of left lower leg limi Modifier: ited to breakdown ted to breakdown Quantity: 1 of skin of skin Electronic Signature(s) Unsigned Previous Signature: 04/25/2018 1:22:53 AM Version By: Worthy Keeler PA-C Entered By: Sharon Mt on 04/25/2018 11:52:22 Signature(s): Date(s):

## 2018-04-25 NOTE — Progress Notes (Signed)
ARLYNN, MCDERMID ([id number]) Visit Report for 04/24/2018 Arrival Information Details Patient Name: [name], [name]. Date of Service: 04/24/2018 11:00 AM Medical Record Number: [id number] Patient Account Number: [id number] Date of Birth/Sex: 12/04/1960 (57 y.o. M) Treating RN: Montey Hora Primary Care Romon Devereux: [name] Other Clinician: Referring Joane Postel: [name] Treating Lylith Bebeau/Extender: Melburn Hake, [name] Weeks in Treatment: 3 Visit Information History Since Last Visit Added or deleted any medications: No Patient Arrived: Ambulatory Any new allergies or adverse reactions: No Arrival Time: 10:50 Had a fall or experienced change in No Accompanied By: self activities of daily living that may affect Transfer Assistance: None risk of falls: Patient Identification Verified: Yes Signs or symptoms of abuse/neglect since last visito No Secondary Verification Process Yes Hospitalized since last visit: No Completed: Implantable device outside of the clinic excluding No Patient Has Alerts: Yes cellular tissue based products placed in the center Patient Alerts: DMII since last visit: ABI [place] BILATERAL Has Dressing in Place as Prescribed: Yes >220 Has Compression in Place as Prescribed: Yes Pain Present Now: No Electronic Signature(s) Signed: 04/24/2018 4:10:25 PM By: Montey Hora Entered By: Montey Hora on 04/24/2018 10:51:01 Shaler, Kolbie Stein. ([id number]) -------------------------------------------------------------------------------- Clinic Level of Care Assessment Details Patient Name: [name], [name]. Date of Service: 04/24/2018 11:00 AM Medical Record Number: [id number] Patient Account Number: [id number] Date of Birth/Sex: 11/15/1960 (57 y.o. M) Treating RN: Ahmed Prima Primary Care Melana Hingle: [name] Other Clinician: Referring Mykel Sponaugle: [name] Treating Tanessa Tidd/Extender: Melburn Hake, [name] Weeks in Treatment: 3 Clinic Level of Care Assessment  Items TOOL 4 Quantity Score X - Use when only an EandM is performed on FOLLOW-UP visit 1 0 ASSESSMENTS - Nursing Assessment / Reassessment X - Reassessment of Co-morbidities (includes updates in patient status) 1 10 X- 1 5 Reassessment of Adherence to Treatment Plan ASSESSMENTS - Wound and Skin Assessment / Reassessment X - Simple Wound Assessment / Reassessment - one wound 1 5 []  - 0 Complex Wound Assessment / Reassessment - multiple wounds []  - 0 Dermatologic / Skin Assessment (not related to wound area) ASSESSMENTS - Focused Assessment []  - Circumferential Edema Measurements - multi extremities 0 []  - 0 Nutritional Assessment / Counseling / Intervention []  - 0 Lower Extremity Assessment (monofilament, tuning fork, pulses) []  - 0 Peripheral Arterial Disease Assessment (using hand held doppler) ASSESSMENTS - Ostomy and/or Continence Assessment and Care []  - Incontinence Assessment and Management 0 []  - 0 Ostomy Care Assessment and Management (repouching, etc.) PROCESS - Coordination of Care X - Simple Patient / Family Education for ongoing care 1 15 []  - 0 Complex (extensive) Patient / Family Education for ongoing care []  - 0 Staff obtains Programmer, systems, Records, Test Results / Process Orders []  - 0 Staff telephones HHA, Nursing Homes / Clarify orders / etc []  - 0 Routine Transfer to another Facility (non-emergent condition) []  - 0 Routine Hospital Admission (non-emergent condition) []  - 0 New Admissions / Biomedical engineer / Ordering NPWT, Apligraf, etc. []  - 0 Emergency Hospital Admission (emergent condition) X- 1 10 Simple Discharge Coordination [name], [name]. ([id number]) []  - 0 Complex (extensive) Discharge Coordination PROCESS - Special Needs []  - Pediatric / Minor Patient Management 0 []  - 0 Isolation Patient Management []  - 0 Hearing / Language / Visual special needs []  - 0 Assessment of Community assistance (transportation, D/C planning, etc.) []  -  0 Additional assistance / Altered mentation []  - 0 Support Surface(s) Assessment (bed, cushion, seat, etc.) INTERVENTIONS - Wound Cleansing / Measurement X - Simple Wound Cleansing -  one wound 1 5 []  - 0 Complex Wound Cleansing - multiple wounds X- 1 5 Wound Imaging (photographs - any number of wounds) []  - 0 Wound Tracing (instead of photographs) []  - 0 Simple Wound Measurement - one wound []  - 0 Complex Wound Measurement - multiple wounds INTERVENTIONS - Wound Dressings []  - Small Wound Dressing one or multiple wounds 0 []  - 0 Medium Wound Dressing one or multiple wounds []  - 0 Large Wound Dressing one or multiple wounds []  - 0 Application of Medications - topical []  - 0 Application of Medications - injection INTERVENTIONS - Miscellaneous []  - External ear exam 0 []  - 0 Specimen Collection (cultures, biopsies, blood, body fluids, etc.) []  - 0 Specimen(s) / Culture(s) sent or taken to Lab for analysis []  - 0 Patient Transfer (multiple staff / Civil Service fast streamer / Similar devices) []  - 0 Simple Staple / Suture removal (25 or less) []  - 0 Complex Staple / Suture removal (26 or more) []  - 0 Hypo / Hyperglycemic Management (close monitor of Blood Glucose) []  - 0 Ankle / Brachial Index (ABI) - do not check if billed separately X- 1 5 Vital Signs [name], [name]. ([id number]) Has the patient been seen at the hospital within the last three years: Yes Total Score: 60 Level Of Care: New/Established - Level 2 Electronic Signature(s) Signed: 04/24/2018 4:10:39 PM By: Alric Quan Entered By: Alric Quan on 04/24/2018 11:15:02 [name], [name]. ([id number]) -------------------------------------------------------------------------------- Encounter Discharge Information Details Patient Name: [name], [name]. Date of Service: 04/24/2018 11:00 AM Medical Record Number: [id number] Patient Account Number: [id number] Date of Birth/Sex: 11-23-1960 (57 y.o. M) Treating RN:  Ahmed Prima Primary Care Shaylen Nephew: [name] Other Clinician: Referring Johnie Stadel: [name] Treating Trudee Chirino/Extender: Melburn Hake, [name] Weeks in Treatment: 3 Encounter Discharge Information Items Discharge Condition: Stable Ambulatory Status: Ambulatory Discharge Destination: Home Transportation: Private Auto Accompanied By: self Schedule Follow-up Appointment: No Clinical Summary of Care: Electronic Signature(s) Signed: 04/24/2018 4:10:39 PM By: Alric Quan Entered By: Alric Quan on 04/24/2018 11:10:05 Amer, Korie Stein. ([id number]) -------------------------------------------------------------------------------- Lower Extremity Assessment Details Patient Name: [name], [name]. Date of Service: 04/24/2018 11:00 AM Medical Record Number: [id number] Patient Account Number: [id number] Date of Birth/Sex: 04/25/1961 (57 y.o. M) Treating RN: Montey Hora Primary Care Mckenzi Buonomo: [name] Other Clinician: Referring Wilkin Lippy: [name] Treating Larrell Rapozo/Extender: Melburn Hake, [name] Weeks in Treatment: 3 Edema Assessment Assessed: [Left: No] [Right: No] [Left: Edema] [Right: :] Calf Left: Right: Point of Measurement: 37 cm From Medial Instep 45.3 cm 47.5 cm Ankle Left: Right: Point of Measurement: 13 cm From Medial Instep 27 cm 27.6 cm Vascular Assessment Pulses: Dorsalis Pedis Palpable: [Left:Yes] [Right:Yes] Posterior Tibial Extremity colors, hair growth, and conditions: Extremity Color: [Left:Hyperpigmented] [Right:Hyperpigmented] Hair Growth on Extremity: [Left:No] [Right:No] Temperature of Extremity: [Left:Warm] [Right:Warm] Capillary Refill: [Left:< 3 seconds] [Right:< 3 seconds] Toe Nail Assessment Left: Right: Thick: Yes Yes Discolored: Yes Yes Deformed: No No Improper Length and Hygiene: No No Electronic Signature(s) Signed: 04/24/2018 4:10:25 PM By: Montey Hora Entered By: Montey Hora on 04/24/2018 10:56:25 [name], [name].  ([id number]) -------------------------------------------------------------------------------- Multi Wound Chart Details Patient Name: [name], [name]. Date of Service: 04/24/2018 11:00 AM Medical Record Number: [id number] Patient Account Number: [id number] Date of Birth/Sex: 07/07/1961 (57 y.o. 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] [N/A:N/A] Secondary Etiology: [2R:Lymphedema] [N/A:N/A] Date Acquired: [2R:04/14/2018] [N/A:N/A] Weeks of Treatment: [2R:3] [N/A:N/A] Wound Status: [2R:Open] [N/A:N/A] Wound Recurrence: [2R:Yes] [N/A:N/A] Clustered Wound: [2R:Yes] [N/A:N/A] Measurements Stein x W x D [2R:0x0x0] [N/A:N/A] (cm) Area (cm) : [2R:0] [N/A:N/A] Volume (cm) : [2R:0] [N/A:N/A] % Reduction in Area: [2R:100.00%] [N/A:N/A] % Reduction in Volume: [2R:100.00%] [N/A:N/A] Classification: [2R:Grade 1] [N/A:N/A] Periwound Skin Texture: [2R:No Abnormalities Noted] [N/A:N/A] Periwound Skin Moisture: [2R:No Abnormalities Noted] [N/A:N/A] Periwound Skin Color: [2R:No Abnormalities Noted No] [N/A:N/A N/A] Treatment Notes Electronic Signature(s) Signed: 04/24/2018 4:10:39 PM By: Alric Quan Entered By: Alric Quan on 04/24/2018 11:07:04 Christman, Abhiraj Stein. ([id number]) -------------------------------------------------------------------------------- [place] Details Patient Name: [name], [name]. Date of Service: 04/24/2018 11:00 AM Medical  Record Number: [id number] Patient Account Number: [id number] Date of Birth/Sex: 1961-07-08 (57 y.o. M) Treating RN: Ahmed Prima Primary Care Amyre Segundo: [name] Other Clinician: Referring Cristen Bredeson: [name] Treating Eugenie Harewood/Extender: Melburn Hake, [name] Weeks in Treatment: 3 Active Inactive Electronic Signature(s) Signed: 04/24/2018 4:10:39 PM By: Alric Quan Entered By: Alric Quan on 04/24/2018 11:10:45 Mellinger, Darshan Stein. ([id number]) -------------------------------------------------------------------------------- Pain Assessment Details Patient Name: [name], [name]. Date of Service: 04/24/2018 11:00 AM Medical Record Number: [id number] Patient Account Number: [id number] Date of Birth/Sex: 01-18-1961 (56 y.o. M) Treating RN: Montey Hora Primary Care Modean Mccullum: [name] Other Clinician: Referring Jamelyn Bovard: [name] Treating Sumayyah Custodio/Extender: Melburn Hake, [name] Weeks in Treatment: 3 Active Problems Location of Pain Severity and Description of Pain Patient Has Paino Yes Site Locations Pain Location: Generalized Pain Pain Management and Medication Current Pain Management: Goals for Pain Management legs ache Electronic Signature(s) Signed: 04/24/2018 4:10:25 PM By: Montey Hora Entered By: Montey Hora on 04/24/2018 10:51:16 [name], [name]. ([id number]) -------------------------------------------------------------------------------- Patient/Caregiver Education Details Patient Name: [name], [name]. Date of Service: 04/24/2018 11:00 AM Medical Record Number: [id number] Patient Account Number: [id number] Date of Birth/Gender: 1961-04-23 (57 y.o. M) Treating RN: Ahmed Prima Primary Care Physician: [name] Other Clinician: Referring Physician: [name] Treating Physician/Extender: Sharalyn Ink in Treatment: 3 Education Assessment Education Provided To: Patient Education Topics Provided Wound/Skin  Impairment: Handouts: Other: Please call our office if you have any questions or concerns. Methods: Explain/Verbal Responses: State content correctly Electronic Signature(s) Signed: 04/24/2018 4:10:39 PM By: Alric Quan Entered By: Alric Quan on 04/24/2018 11:10:26 [name], [name]. ([id number]) -------------------------------------------------------------------------------- Wound Assessment Details Patient Name: [name], [name]. Date of Service: 04/24/2018 11:00 AM Medical Record Number: [id number] Patient Account Number: [id number] Date of Birth/Sex: [birth year] (57 y.o. M) Treating RN: Montey Hora Primary Care Shakeyla Giebler: [name] Other Clinician: Referring Kacelyn Rowzee: [name] Treating Yanky Vanderburg/Extender: Melburn Hake, [name] Weeks in Treatment: 3 Wound Status Wound Number: [id number] Primary Etiology: Diabetic Wound/Ulcer of the Lower Extremity Wound Location: Left, Anterior Lower Leg Secondary Lymphedema Wounding Event: Gradually Appeared Etiology: Date Acquired: 04/14/2018 Wound Status: Open Weeks Of Treatment: 3 Clustered Wound: Yes Photos Photo Uploaded By: Montey Hora on 04/24/2018 13:05:41 Wound Measurements Length: (cm) 0 % Width: (cm) 0 % Depth: (cm) 0 Area: (cm) 0 Volume: (cm) 0 Reduction in Area: 100% Reduction in Volume: 100% Wound Description Classification: Grade 1 Periwound Skin Texture Texture Color No Abnormalities Noted: No No Abnormalities Noted: No Moisture No Abnormalities Noted: No Electronic Signature(s) Signed: 04/24/2018 4:10:25 PM By: Montey Hora Entered By: Montey Hora on 04/24/2018 10:53:56 Marcon, Jerol Stein. ([id number]) -------------------------------------------------------------------------------- [place] Details Patient Name:  Vasconcelos, [name]. Date of Service: 04/24/2018 11:00 AM Medical Record Number: [id number] Patient Account Number: [id number] Date of Birth/Sex: 1961-06-06 (57 y.o. M) Treating RN: Montey Hora Primary Care Christo Hain: [name] Other Clinician: Referring Jeanmarie Mccowen: [name] Treating Jashiya Bassett/Extender: Melburn Hake, [name] Weeks in Treatment: 3 Vital Signs Time Taken: 10:54 Temperature (F): 98.1 Height (in): 70 Pulse (bpm): 93 Weight (lbs): 302 Respiratory Rate (breaths/min): 18 Body Mass Index (BMI): 43.3 Blood Pressure (mmHg): 129/64 Reference Range: 80 - 120 mg / dl Electronic Signature(s) Signed: 04/24/2018 4:10:25 PM By: Montey Hora Entered By: Montey Hora on 04/24/2018 10:54:38

## 2018-04-30 ENCOUNTER — Other Ambulatory Visit: Payer: Self-pay

## 2018-04-30 ENCOUNTER — Encounter: Payer: Self-pay | Admitting: Surgery

## 2018-04-30 ENCOUNTER — Ambulatory Visit (HOSPITAL_COMMUNITY)
Admission: RE | Admit: 2018-04-30 | Discharge: 2018-04-30 | Disposition: A | Discharge status: Home / Self Care | Payer: Medicaid Other | Source: Ambulatory Visit | Attending: Surgery | Admitting: Surgery

## 2018-04-30 ENCOUNTER — Ambulatory Visit (INDEPENDENT_AMBULATORY_CARE_PROVIDER_SITE_OTHER): Payer: Medicaid Other | Admitting: Surgery

## 2018-04-30 VITALS — BP 119/69 | HR 82 | Resp 20 | Ht 70.0 in | Wt 309.0 lb

## 2018-04-30 DIAGNOSIS — Z86718 Personal history of other venous thrombosis and embolism: Secondary | ICD-10-CM | POA: Diagnosis not present

## 2018-04-30 DIAGNOSIS — I872 Venous insufficiency (chronic) (peripheral): Secondary | ICD-10-CM

## 2018-04-30 DIAGNOSIS — I82401 Acute embolism and thrombosis of unspecified deep veins of right lower extremity: Secondary | ICD-10-CM | POA: Diagnosis not present

## 2018-04-30 DIAGNOSIS — I83893 Varicose veins of bilateral lower extremities with other complications: Secondary | ICD-10-CM | POA: Diagnosis not present

## 2018-04-30 NOTE — Progress Notes (Signed)
[facility] [place]  Patient name: [name] MRN: [id number] DOB: Sep 09, 1961 Sex: male   REQUESTING PROVIDER:    Jeri Cos   REASON FOR CONSULT:    Venous stasis  HISTORY OF PRESENT ILLNESS:   [name] is a 57 y.o. male, who is referred for evaluation of venous stasis changes.  Patient has had long-standing history of bilateral leg swelling.  He does wear 20-30 compression stockings.  He has had some skin discoloration and drainage.  He does report a history of DVT and PE in the past.  The patient suffers from diabetes.  His last hemoglobin A1c was 8.2.  He is also managed by pain management with oxycodone's.  He also gets injections from neurology as well as Lyrica.  He suffers from morbid obesity and is entertaining weight loss surgery.  He is medically managed for hypertension.  He takes a statin for hypercholesterolemia.  He has a history of coronary artery disease and has undergone stenting to his LAD in 2017.   PAST MEDICAL HISTORY    Past Medical History:  Diagnosis Date  . Anemia   . Arthritis   . Back pain   . CAD (coronary artery disease)    a. s/p DES to LAD in 05/2016  . Cervical radiculopathy   . Chronic diastolic CHF (congestive heart failure) ([place])   . Chronic pain   . Depression   . DVT (deep venous thrombosis) ([place])   . Hematemesis   . Hepatic steatosis   . Hyperlipidemia   . Hypertension   . IBS (irritable bowel syndrome)   . Morbid obesity ([place])   . OSA (obstructive sleep apnea)   . Pancreatitis   . PE (pulmonary thromboembolism) ([facility])   . Peripheral neuropathy   . PUD (peptic ulcer disease)   . Renal disorder   . Stroke [facility])    a. ?details unclear - not seen on imaging when he was admitted in 05/2017 for TIA symptoms which were felt due to cervical radiculopathy.  . Thoracic aortic ectasia ([facility])    a. 4.3cm ectatic ascending thoracic aorta by CT 06/2017.   . Type 2  diabetes mellitus ([place])      FAMILY HISTORY   Family History  Problem Relation Age of Onset  . Cancer Father   . Hypertension Mother   . Diabetes Mother   . Breast cancer Mother   . Hypertension Brother   . Diabetes Brother   . Hypertension Sister   . Diabetes Sister     SOCIAL HISTORY:   Social History   Socioeconomic History  . Marital status: Single    Spouse name: Not on file  . Number of children: 3  . Years of education: 13  . Highest education level: Not on file  Occupational History  . Occupation: Disabled  Social Needs  . Financial resource strain: Not on file  . Food insecurity:    Worry: Not on file    Inability: Not on file  . Transportation needs:    Medical: Not on file    Non-medical: Not on file  Tobacco Use  . Smoking status: Never Smoker  . Smokeless tobacco: Never Used  Substance and Sexual Activity  . Alcohol use: No  . Drug use: No  . Sexual activity: Not on file  Lifestyle  . Physical activity:    Days per week: Not on file    Minutes per session: Not on file  . Stress: Not  on file  Relationships  . Social connections:    Talks on phone: Not on file    Gets together: Not on file    Attends religious service: Not on file    Active member of club or organization: Not on file    Attends meetings of clubs or organizations: Not on file    Relationship status: Not on file  . Intimate partner violence:    Fear of current or ex partner: Not on file    Emotionally abused: Not on file    Physically abused: Not on file    Forced sexual activity: Not on file  Other Topics Concern  . Not on file  Social History Narrative   Independent and ambulatory with cane.   Lives at home alone.   Right-handed.   1-2 cups caffeine per day.    ALLERGIES:    Allergies  Allergen Reactions  . Coconut Flavor [Flavoring Agent] Hives  . Coconut Oil Hives  . Ibuprofen Other (See Comments)    Made gastric ulcers worse  . Aleve [Naproxen] Other (See  Comments)    DUE TO KIDNEYS  . Nsaids Other (See Comments)    Stomach ulcers     CURRENT MEDICATIONS:    Current Outpatient Medications  Medication Sig Dispense Refill  . aspirin 81 MG chewable tablet Chew 1 tablet (81 mg total) by mouth daily. 30 tablet 0  . atorvastatin (LIPITOR) 80 MG tablet Take 1 tablet (80 mg total) by mouth daily. 90 tablet 3  . carvedilol (COREG) 25 MG tablet Take 1 tablet (25 mg total) by mouth 2 (two) times daily with a meal. 180 tablet 3  . diclofenac sodium (VOLTAREN) 1 % GEL Apply 1 application topically 4 (four) times daily. 100 g 0  . doxycycline (VIBRAMYCIN) 100 MG capsule Take 1 capsule (100 mg total) by mouth 2 (two) times daily. 14 capsule 0  . DULoxetine (CYMBALTA) 60 MG capsule Take 1 capsule (60 mg total) by mouth daily. 30 capsule 8  . furosemide (LASIX) 40 MG tablet Take 1 tablet (40 mg total) by mouth every evening. 30 tablet   . furosemide (LASIX) 80 MG tablet Take 1 tablet (80 mg total) by mouth daily. 30 tablet   . glipiZIDE (GLUCOTROL) 10 MG tablet Take 10 mg by mouth 2 (two) times daily.  4  . hydrALAZINE (APRESOLINE) 25 MG tablet Take 3 tablets (75 mg total) by mouth 3 (three) times daily. 540 tablet 3  . hydrALAZINE (APRESOLINE) 50 MG tablet TAKE 1 TABLET (50 MG TOTAL) BY MOUTH 3 (THREE) TIMES DAILY. 90 tablet 3  . Insulin Detemir (LEVEMIR) 100 UNIT/ML Pen Inject 20 Units into the skin daily at 10 pm. (Patient taking differently: Inject 55 Units into the skin daily at 10 pm. ) 15 mL 3  . Insulin Pen Needle 31G X 5 MM MISC Use 1 needle daily to inject insulin as prescribed 100 each 2  . isosorbide dinitrate (ISORDIL) 30 MG tablet Take 1 tablet (30 mg total) by mouth every morning. 30 tablet 0  . lisinopril (PRINIVIL,ZESTRIL) 10 MG tablet Take 1 tablet (10 mg total) by mouth daily. 30 tablet 0  . LYRICA 100 MG capsule Take 100 mg by mouth 2 (two) times daily.  4  . metFORMIN (GLUCOPHAGE-XR) 500 MG 24 hr tablet Take 2 tablets (1,000 mg total)  by mouth 2 (two) times daily.    . nitroGLYCERIN (NITROSTAT) 0.4 MG SL tablet Place 0.4 mg under the tongue every 5 (  five) minutes as needed for chest pain.  4  . oxyCODONE (ROXICODONE) 15 MG immediate release tablet Take 15 mg 3 (three) times daily as needed by mouth for pain.   0  . pantoprazole (PROTONIX) 40 MG tablet Take 1 tablet (40 mg total) by mouth 2 (two) times daily. 60 tablet 1  . potassium chloride SA (K-DUR,KLOR-CON) 20 MEQ tablet Take 40 mEq by mouth daily.     . sertraline (ZOLOFT) 50 MG tablet Take 50 mg by mouth daily.    . sitaGLIPtin (JANUVIA) 100 MG tablet Take 100 mg by mouth daily.    . traZODone (DESYREL) 50 MG tablet Take 1 tablet (50 mg total) by mouth at bedtime. 30 tablet 11  . vitamin B-12 (CYANOCOBALAMIN) 1000 MCG tablet Take 1,000 mcg by mouth daily.    . Vitamin D, Ergocalciferol, (DRISDOL) 50000 units CAPS capsule Take 1 capsule by mouth every Monday.  2  . famotidine (PEPCID) 40 MG tablet Take 1 tablet (40 mg total) by mouth at bedtime. (Patient not taking: Reported on 04/30/2018) 30 tablet 2  . methocarbamol (ROBAXIN) 750 MG tablet Take 1 tablet (750 mg total) by mouth every 4 (four) hours. (Patient not taking: Reported on 04/30/2018) 40 tablet 0  . propranolol (INDERAL) 80 MG tablet Take 80 mg by mouth.    . terbinafine (LAMISIL) 250 MG tablet Take 250 mg by mouth daily.  0   No current facility-administered medications for this visit.     REVIEW OF SYSTEMS:   [X]  denotes positive finding, [ ]  denotes negative finding Cardiac  Comments:  Chest pain or chest pressure: x   Shortness of breath upon exertion: x   Short of breath when lying flat:    Irregular heart rhythm:        Vascular    Pain in calf, thigh, or hip brought on by ambulation: x   Pain in feet at night that wakes you up from your sleep:     Blood clot in your veins: x   Leg swelling:  x       Pulmonary    Oxygen at home:    Productive cough:     Wheezing:         Neurologic    Sudden  weakness in arms or legs:  x   Sudden numbness in arms or legs:  x   Sudden onset of difficulty speaking or slurred speech:    Temporary loss of vision in one eye:     Problems with dizziness:  x       Gastrointestinal    Blood in stool:      Vomited blood:         Genitourinary    Burning when urinating:     Blood in urine:        Psychiatric    Major depression:  x       Hematologic    Bleeding problems: x   Problems with blood clotting too easily:        Skin    Rashes or ulcers:        Constitutional    Fever or chills:     PHYSICAL EXAM:   Vitals:   04/30/18 1419  BP: 119/69  Pulse: 82  Resp: 20  SpO2: 97%  Weight: (!) 309 lb (140.2 kg)  Height: 5\' 10"  (1.778 m)    GENERAL: The patient is a well-nourished male, in no acute distress. The vital signs are documented above.  CARDIAC: There is a regular rate and rhythm.  VASCULAR: Nonpitting bilateral edema  PULMONARY: Nonlabored respirations ABDOMEN: Soft and non-tender with normal pitched bowel sounds.  MUSCULOSKELETAL: There are no major deformities or cyanosis. NEUROLOGIC: No focal weakness or paresthesias are detected. SKIN:  Bilateral hyperpigmentation with superficial sloughing of the skin and drainage pSYCHIATRIC: The patient has a normal affect.  STUDIES:   I have ordered and reviewed her vascular lab studies.  There were abnormal reflux times within the right common femoral vein.  There is no evidence of DVT.  There are abnormal reflux times in the left common femoral vein.  There is no evidence of DVT.  ASSESSMENT and PLAN   Chronic venous stasis changes: By ultrasound, there is no correctable axial reflux.  He does have bilateral common femoral reflux.  I suspect there is also a significant component of lymphedema.  He will be best managed with compression therapy.  He may even want to consider higher compression in 20-30.  I am also making a referral to the lymphedema clinic.  He will follow-up as  needed.   Annamarie Major, MD [facility] [phone] Pager [phone]

## 2018-05-04 ENCOUNTER — Emergency Department (HOSPITAL_COMMUNITY)
Admission: EM | Admit: 2018-05-04 | Discharge: 2018-05-04 | Disposition: A | Discharge status: Home / Self Care | Payer: Medicaid Other | Attending: Emergency Medicine | Admitting: Emergency Medicine

## 2018-05-04 ENCOUNTER — Emergency Department (HOSPITAL_COMMUNITY): Payer: Medicaid Other

## 2018-05-04 ENCOUNTER — Encounter (HOSPITAL_COMMUNITY): Payer: Self-pay | Admitting: Emergency Medicine

## 2018-05-04 DIAGNOSIS — Z86711 Personal history of pulmonary embolism: Secondary | ICD-10-CM | POA: Diagnosis not present

## 2018-05-04 DIAGNOSIS — I5032 Chronic diastolic (congestive) heart failure: Secondary | ICD-10-CM | POA: Diagnosis not present

## 2018-05-04 DIAGNOSIS — Z7982 Long term (current) use of aspirin: Secondary | ICD-10-CM | POA: Diagnosis not present

## 2018-05-04 DIAGNOSIS — S8001XA Contusion of right knee, initial encounter: Secondary | ICD-10-CM | POA: Diagnosis not present

## 2018-05-04 DIAGNOSIS — I2510 Atherosclerotic heart disease of native coronary artery without angina pectoris: Secondary | ICD-10-CM | POA: Insufficient documentation

## 2018-05-04 DIAGNOSIS — I110 Hypertensive heart disease with heart failure: Secondary | ICD-10-CM | POA: Insufficient documentation

## 2018-05-04 DIAGNOSIS — Y9301 Activity, walking, marching and hiking: Secondary | ICD-10-CM | POA: Diagnosis not present

## 2018-05-04 DIAGNOSIS — Z794 Long term (current) use of insulin: Secondary | ICD-10-CM | POA: Insufficient documentation

## 2018-05-04 DIAGNOSIS — Z86718 Personal history of other venous thrombosis and embolism: Secondary | ICD-10-CM | POA: Diagnosis not present

## 2018-05-04 DIAGNOSIS — Y929 Unspecified place or not applicable: Secondary | ICD-10-CM | POA: Insufficient documentation

## 2018-05-04 DIAGNOSIS — Y999 Unspecified external cause status: Secondary | ICD-10-CM | POA: Insufficient documentation

## 2018-05-04 DIAGNOSIS — E1140 Type 2 diabetes mellitus with diabetic neuropathy, unspecified: Secondary | ICD-10-CM | POA: Diagnosis not present

## 2018-05-04 DIAGNOSIS — Z8673 Personal history of transient ischemic attack (TIA), and cerebral infarction without residual deficits: Secondary | ICD-10-CM | POA: Diagnosis not present

## 2018-05-04 DIAGNOSIS — W19XXXA Unspecified fall, initial encounter: Secondary | ICD-10-CM | POA: Diagnosis not present

## 2018-05-04 DIAGNOSIS — Z79899 Other long term (current) drug therapy: Secondary | ICD-10-CM | POA: Diagnosis not present

## 2018-05-04 MED ORDER — KETOROLAC TROMETHAMINE 60 MG/2ML IM SOLN
60.0000 mg | Freq: Once | INTRAMUSCULAR | Status: AC
  Administered 2018-05-04: 60 mg via INTRAMUSCULAR
  Filled 2018-05-04: qty 2

## 2018-05-04 NOTE — ED Triage Notes (Signed)
Patient reports "my hip gave out and I fell." C/o right knee and right hip pain. Denies head injury and LOC. Ambulatory with cane.

## 2018-05-04 NOTE — Discharge Instructions (Addendum)
See your Orthopaedist for recheck.   Call Monday to schedule appointment.

## 2018-05-06 NOTE — ED Provider Notes (Signed)
[facility] DEPT Provider Note   CSN: [id number] Arrival date & time: 05/04/18  1719     History   Chief Complaint Chief Complaint  Patient presents with  . Fall    HPI [name] is a 57 y.o. male.  The history is provided by the patient. No language interpreter was used.  Fall  This is a new problem. The current episode started 3 to 5 hours ago. The problem occurs constantly. The problem has been rapidly worsening. Nothing aggravates the symptoms. Nothing relieves the symptoms. He has tried nothing for the symptoms. The treatment provided no relief.   Pt complains of his hip going out and he fell hitting right knee.  Pt reports he did not hit hip.  Pt complains of soren3ss with walking.   Past Medical History:  Diagnosis Date  . Anemia   . Arthritis   . Back pain   . CAD (coronary artery disease)    a. s/p DES to LAD in 05/2016  . Cervical radiculopathy   . Chronic diastolic CHF (congestive heart failure) ([place])   . Chronic pain   . Depression   . DVT (deep venous thrombosis) ([place])   . Hematemesis   . Hepatic steatosis   . Hyperlipidemia   . Hypertension   . IBS (irritable bowel syndrome)   . Morbid obesity ([place])   . OSA (obstructive sleep apnea)   . Pancreatitis   . PE (pulmonary thromboembolism) ([place])   . Peripheral neuropathy   . PUD (peptic ulcer disease)   . Renal disorder   . Stroke [facility])    a. ?details unclear - not seen on imaging when he was admitted in 05/2017 for TIA symptoms which were felt due to cervical radiculopathy.  . Thoracic aortic ectasia (HCC)    a. 4.3cm ectatic ascending thoracic aorta by CT 06/2017.   . Type 2 diabetes mellitus [facility])     Patient Active Problem List   Diagnosis Date Noted  . B12 deficiency 08/10/2017  . Persistent headaches 08/09/2017  . GERD (gastroesophageal reflux disease) 06/29/2017  . Left arm numbness   . Cerebral embolism with cerebral infarction 06/17/2017  . TIA  (transient ischemic attack) 06/17/2017  . Chronic back pain 12/29/2016  . Depression 12/15/2016  . Vitamin D deficiency 12/09/2016  . Right hip pain 12/07/2016  . Hypokalemia 12/07/2016  . Type 2 diabetes mellitus with vascular disease ([place]) 05/31/2016  . Normocytic normochromic anemia 05/31/2016  . Chest pain 05/31/2016  . CAD (coronary artery disease) 01/06/2016  . DVT (deep venous thrombosis) ([place]) 01/06/2016  . Lactic acidosis 05/28/2014  . Nonspecific chest pain 01/29/2014  . Uncontrolled secondary diabetes with peripheral neuropathy ([place]) 01/29/2014  . Obesity, Class III, BMI 40-49.9 (morbid obesity) ([place]) 01/29/2014  . Snoring 01/29/2014  . Dyslipidemia 01/29/2014  . HTN (hypertension) 01/29/2014  . Abnormal nuclear stress test 01/29/2014    Past Surgical History:  Procedure Laterality Date  . CARDIAC CATHETERIZATION N/A 05/31/2016   Procedure: Left Heart Cath and Coronary Angiography;  Surgeon: Peter M Martinique, MD;  Location: [place] CV LAB;  Service: Cardiovascular;  Laterality: N/A;  . CARDIAC CATHETERIZATION N/A 05/31/2016   Procedure: Intravascular Pressure Wire/FFR Study;  Surgeon: Peter M Martinique, MD;  Location: [place] CV LAB;  Service: Cardiovascular;  Laterality: N/A;  . CARDIAC CATHETERIZATION N/A 05/31/2016   Procedure: Coronary Stent Intervention;  Surgeon: Peter M Martinique, MD;  Location: [place] CV LAB;  Service: Cardiovascular;  Laterality: N/A;  .  LEFT HEART CATH AND CORONARY ANGIOGRAPHY N/A 12/08/2017   Procedure: LEFT HEART CATH AND CORONARY ANGIOGRAPHY;  Surgeon: Leonie Man, MD;  Location: [place] CV LAB;  Service: Cardiovascular;  Laterality: N/A;  . LEFT HEART CATHETERIZATION WITH CORONARY ANGIOGRAM N/A 02/03/2014   Procedure: LEFT HEART CATHETERIZATION WITH CORONARY ANGIOGRAM;  Surgeon: Pixie Casino, MD;  Location: [facility] C[facility] LAB;  Service: Cardiovascular;  Laterality: N/A;  . left leg stent           Home Medications    Prior to  Admission medications   Medication Sig Start Date End Date Taking? Authorizing Provider  aspirin 81 MG chewable tablet Chew 1 tablet (81 mg total) by mouth daily. 06/01/16   Elwin Mocha, MD  atorvastatin (LIPITOR) 80 MG tablet Take 1 tablet (80 mg total) by mouth daily. 01/26/17   Ahmed Prima, Fransisco Hertz, PA-C  carvedilol (COREG) 25 MG tablet Take 1 tablet (25 mg total) by mouth 2 (two) times daily with a meal. 01/26/17   Strader, [place] M, PA-C  diclofenac sodium (VOLTAREN) 1 % GEL Apply 1 application topically 4 (four) times daily. 04/05/18   Melynda Ripple, MD  doxycycline (VIBRAMYCIN) 100 MG capsule Take 1 capsule (100 mg total) by mouth 2 (two) times daily. 04/01/18   Glyn Ade, PA-C  DULoxetine (CYMBALTA) 60 MG capsule Take 1 capsule (60 mg total) by mouth daily. 03/08/18   Marcial Pacas, MD  famotidine (PEPCID) 40 MG tablet Take 1 tablet (40 mg total) by mouth at bedtime. Patient not taking: Reported on 04/30/2018 06/18/17   Barton Dubois, MD  furosemide (LASIX) 40 MG tablet Take 1 tablet (40 mg total) by mouth every evening. 12/09/17   Velvet Bathe, MD  furosemide (LASIX) 80 MG tablet Take 1 tablet (80 mg total) by mouth daily. 12/10/17   Velvet Bathe, MD  glipiZIDE (GLUCOTROL) 10 MG tablet Take 10 mg by mouth 2 (two) times daily. 05/30/17   [provider]  hydrALAZINE (APRESOLINE) 25 MG tablet Take 3 tablets (75 mg total) by mouth 3 (three) times daily. 01/30/18   Barrett, Evelene Croon, PA-C  hydrALAZINE (APRESOLINE) 50 MG tablet TAKE 1 TABLET (50 MG TOTAL) BY MOUTH 3 (THREE) TIMES DAILY. 03/27/18   Lelon Perla, MD  Insulin Detemir (LEVEMIR) 100 UNIT/ML Pen Inject 20 Units into the skin daily at 10 pm. Patient taking differently: Inject 55 Units into the skin daily at 10 pm.  06/18/17   Barton Dubois, MD  Insulin Pen Needle 31G X 5 MM MISC Use 1 needle daily to inject insulin as prescribed 06/18/17   Barton Dubois, MD  isosorbide dinitrate (ISORDIL) 30 MG tablet Take 1 tablet (30 mg  total) by mouth every morning. 12/10/17   Velvet Bathe, MD  lisinopril (PRINIVIL,ZESTRIL) 10 MG tablet Take 1 tablet (10 mg total) by mouth daily. 12/10/17   Velvet Bathe, MD  LYRICA 100 MG capsule Take 100 mg by mouth 2 (two) times daily. 11/29/17   [provider]  metFORMIN (GLUCOPHAGE-XR) 500 MG 24 hr tablet Take 2 tablets (1,000 mg total) by mouth 2 (two) times daily. 07/02/17   Barton Dubois, MD  methocarbamol (ROBAXIN) 750 MG tablet Take 1 tablet (750 mg total) by mouth every 4 (four) hours. Patient not taking: Reported on 04/30/2018 04/05/18   Melynda Ripple, MD  nitroGLYCERIN (NITROSTAT) 0.4 MG SL tablet Place 0.4 mg under the tongue every 5 (five) minutes as needed for chest pain. 12/17/16   [provider]  oxyCODONE (  ROXICODONE) 15 MG immediate release tablet Take 15 mg 3 (three) times daily as needed by mouth for pain.  08/28/17   [provider]  pantoprazole (PROTONIX) 40 MG tablet Take 1 tablet (40 mg total) by mouth 2 (two) times daily. 06/18/17   Barton Dubois, MD  potassium chloride SA (K-DUR,KLOR-CON) 20 MEQ tablet Take 40 mEq by mouth daily.     [provider]  propranolol (INDERAL) 80 MG tablet Take 80 mg by mouth.    [provider]  sertraline (ZOLOFT) 50 MG tablet Take 50 mg by mouth daily.    [provider]  sitaGLIPtin (JANUVIA) 100 MG tablet Take 100 mg by mouth daily.    [provider]  terbinafine (LAMISIL) 250 MG tablet Take 250 mg by mouth daily. 12/07/17   [provider]  traZODone (DESYREL) 50 MG tablet Take 1 tablet (50 mg total) by mouth at bedtime. 12/15/16   Burns, Arloa Koh, MD  vitamin B-12 (CYANOCOBALAMIN) 1000 MCG tablet Take 1,000 mcg by mouth daily.    [provider]  Vitamin D, Ergocalciferol, (DRISDOL) 50000 units CAPS capsule Take 1 capsule by mouth every Monday. 11/06/17   [provider]    Family History Family History  Problem Relation Age of Onset  . Cancer  Father   . Hypertension Mother   . Diabetes Mother   . Breast cancer Mother   . Hypertension Brother   . Diabetes Brother   . Hypertension Sister   . Diabetes Sister     Social History Social History   Tobacco Use  . Smoking status: Never Smoker  . Smokeless tobacco: Never Used  Substance Use Topics  . Alcohol use: No  . Drug use: No     Allergies   Coconut flavor [flavoring agent]; Coconut oil; Ibuprofen; Aleve [naproxen]; and Nsaids   Review of Systems Review of Systems  Musculoskeletal: Positive for joint swelling and myalgias.  All other systems reviewed and are negative.    Physical Exam Updated Vital Signs BP 136/87 (BP Location: Left Arm)   Pulse 91   Temp 98.6 F (37 C) (Oral)   Resp 18   Ht 5\' 10"  (1.778 m)   Wt (!) 138.8 kg (306 lb)   SpO2 96%   BMI 43.91 kg/m   Physical Exam  Constitutional: He is oriented to person, place, and time. He appears well-developed and well-nourished.  HENT:  Head: Normocephalic and atraumatic.  Eyes: Conjunctivae and EOM are normal.  Cardiovascular:  No murmur heard. Pulmonary/Chest: No respiratory distress.  Abdominal: He exhibits no distension. There is no tenderness.  Musculoskeletal: He exhibits tenderness and deformity. He exhibits no edema.  Diffusely tender right knee, pain with movement,nv and ns intact  Neurological: He is alert and oriented to person, place, and time.  Skin: Skin is warm and dry.  Psychiatric: He has a normal mood and affect.  Nursing note and vitals reviewed.    ED Treatments / Results  Labs (all labs ordered are listed, but only abnormal results are displayed) Labs Reviewed - No data to display  EKG None  Radiology Dg Knee Complete 4 Views Right  Result Date: 05/04/2018 CLINICAL DATA:  57 y/o  M; fall with right knee pain. EXAM: RIGHT KNEE - COMPLETE 4+ VIEW COMPARISON:  08/04/2017 right knee radiograph. FINDINGS: No acute fracture or dislocation. No joint effusion. Stable  tricompartmental osteoarthrosis with mild narrowing of the medial femorotibial compartment and moderate tricompartmental osteophytosis. Superior patellar enthesophyte. IMPRESSION: No  acute fracture or dislocation identified. Stable osteoarthrosis of the knee. Electronically Signed   By: Kristine Garbe M.D.   On: 05/04/2018 19:19    Procedures Procedures (including critical care time)  Medications Ordered in ED Medications  ketorolac (TORADOL) injection 60 mg (60 mg Intramuscular Given 05/04/18 1837)     Initial Impression / Assessment and Plan / ED Course  I have reviewed the triage vital signs and the nursing notes.  Pertinent labs & imaging results that were available during my care of the patient were reviewed by me and considered in my medical decision making (see chart for details).     Pt advisedt o follow up with his Orthopaedist for evaluation  Final Clinical Impressions(s) / ED Diagnoses   Final diagnoses:  Contusion of right knee, initial encounter    ED Discharge Orders    None    An After Visit Summary was printed and given to the patient.   Fransico Meadow, [place] 05/06/18 1118    Daleen Bo, MD 05/06/18 [id number]

## 2018-05-15 ENCOUNTER — Emergency Department (HOSPITAL_COMMUNITY)
Admission: EM | Admit: 2018-05-15 | Discharge: 2018-05-15 | Disposition: A | Discharge status: Home / Self Care | Payer: Medicaid Other | Attending: Emergency Medicine | Admitting: Emergency Medicine

## 2018-05-15 ENCOUNTER — Encounter (HOSPITAL_COMMUNITY): Payer: Self-pay | Admitting: Emergency Medicine

## 2018-05-15 DIAGNOSIS — I110 Hypertensive heart disease with heart failure: Secondary | ICD-10-CM | POA: Insufficient documentation

## 2018-05-15 DIAGNOSIS — M25551 Pain in right hip: Secondary | ICD-10-CM | POA: Diagnosis present

## 2018-05-15 DIAGNOSIS — Z794 Long term (current) use of insulin: Secondary | ICD-10-CM | POA: Diagnosis not present

## 2018-05-15 DIAGNOSIS — Z7982 Long term (current) use of aspirin: Secondary | ICD-10-CM | POA: Insufficient documentation

## 2018-05-15 DIAGNOSIS — I5032 Chronic diastolic (congestive) heart failure: Secondary | ICD-10-CM | POA: Diagnosis not present

## 2018-05-15 DIAGNOSIS — I2510 Atherosclerotic heart disease of native coronary artery without angina pectoris: Secondary | ICD-10-CM | POA: Diagnosis not present

## 2018-05-15 DIAGNOSIS — M5441 Lumbago with sciatica, right side: Secondary | ICD-10-CM | POA: Diagnosis not present

## 2018-05-15 DIAGNOSIS — E119 Type 2 diabetes mellitus without complications: Secondary | ICD-10-CM | POA: Insufficient documentation

## 2018-05-15 DIAGNOSIS — Z8673 Personal history of transient ischemic attack (TIA), and cerebral infarction without residual deficits: Secondary | ICD-10-CM | POA: Diagnosis not present

## 2018-05-15 DIAGNOSIS — Z79899 Other long term (current) drug therapy: Secondary | ICD-10-CM | POA: Insufficient documentation

## 2018-05-15 DIAGNOSIS — G8929 Other chronic pain: Secondary | ICD-10-CM | POA: Insufficient documentation

## 2018-05-15 MED ORDER — KETOROLAC TROMETHAMINE 15 MG/ML IJ SOLN
15.0000 mg | Freq: Once | INTRAMUSCULAR | Status: AC
  Administered 2018-05-15: 15 mg via INTRAMUSCULAR
  Filled 2018-05-15: qty 1

## 2018-05-15 NOTE — ED Provider Notes (Signed)
[place] DEPT Provider Note   CSN: [id number] Arrival date & time: 05/15/18  1524     History   Chief Complaint Chief Complaint  Patient presents with  . Hip Pain  . Back Pain    HPI [name] is a 57 y.o. male.  HPI   Pt is a 57 y/o male with a h/o anemia, arthritis, chronic back pain, CAD, CHF, depression, hyperlipidemia, hypertension, morbid obesity, T2DM who presents to the ED today for evaluation of right lower back pain and right hip pain that has been chronic, but seemed to worsen last month when he fell. States he was seen in the ED and received Toradol which improved his sxs temprarily up until last night when they seemed to worsen again.  Patient states he did some heavy lifting yesterday which he thinks caused his symptoms to worsen.  States he takes oxycodone at home from chronic back pain, but he has not taken any because it makes him sleeping and he did not want to sleep. Denies any numbness to the legs. Has been ambulatory at home. No loss of control of bowel or bladder function. No personally h/o CA or IVDU. No dysuria, frequency, or GU sxs.  No chest pain, short of breath, headaches, numbness or weakness.  Past Medical History:  Diagnosis Date  . Anemia   . Arthritis   . Back pain   . CAD (coronary artery disease)    a. s/p DES to LAD in 05/2016  . Cervical radiculopathy   . Chronic diastolic CHF (congestive heart failure) ([place])   . Chronic pain   . Depression   . DVT (deep venous thrombosis) ([place])   . Hematemesis   . Hepatic steatosis   . Hyperlipidemia   . Hypertension   . IBS (irritable bowel syndrome)   . Morbid obesity ([place])   . OSA (obstructive sleep apnea)   . Pancreatitis   . PE (pulmonary thromboembolism) ([place])   . Peripheral neuropathy   . PUD (peptic ulcer disease)   . Renal disorder   . Stroke [facility])    a. ?details unclear - not seen on imaging when he was admitted in 05/2017 for TIA symptoms which  were felt due to cervical radiculopathy.  . Thoracic aortic ectasia (HCC)    a. 4.3cm ectatic ascending thoracic aorta by CT 06/2017.   . Type 2 diabetes mellitus [facility])     Patient Active Problem List   Diagnosis Date Noted  . B12 deficiency 08/10/2017  . Persistent headaches 08/09/2017  . GERD (gastroesophageal reflux disease) 06/29/2017  . Left arm numbness   . Cerebral embolism with cerebral infarction 06/17/2017  . TIA (transient ischemic attack) 06/17/2017  . Chronic back pain 12/29/2016  . Depression 12/15/2016  . Vitamin D deficiency 12/09/2016  . Right hip pain 12/07/2016  . Hypokalemia 12/07/2016  . Type 2 diabetes mellitus with vascular disease ([place]) 05/31/2016  . Normocytic normochromic anemia 05/31/2016  . Chest pain 05/31/2016  . CAD (coronary artery disease) 01/06/2016  . DVT (deep venous thrombosis) ([place]) 01/06/2016  . Lactic acidosis 05/28/2014  . Nonspecific chest pain 01/29/2014  . Uncontrolled secondary diabetes with peripheral neuropathy ([place]) 01/29/2014  . Obesity, Class III, BMI 40-49.9 (morbid obesity) ([place]) 01/29/2014  . Snoring 01/29/2014  . Dyslipidemia 01/29/2014  . HTN (hypertension) 01/29/2014  . Abnormal nuclear stress test 01/29/2014    Past Surgical History:  Procedure Laterality Date  . CARDIAC CATHETERIZATION N/A 05/31/2016   Procedure: Left  Heart Cath and Coronary Angiography;  Surgeon: Peter M Martinique, MD;  Location: [place] CV LAB;  Service: Cardiovascular;  Laterality: N/A;  . CARDIAC CATHETERIZATION N/A 05/31/2016   Procedure: Intravascular Pressure Wire/FFR Study;  Surgeon: Peter M Martinique, MD;  Location: [place] CV LAB;  Service: Cardiovascular;  Laterality: N/A;  . CARDIAC CATHETERIZATION N/A 05/31/2016   Procedure: Coronary Stent Intervention;  Surgeon: Peter M Martinique, MD;  Location: [place] CV LAB;  Service: Cardiovascular;  Laterality: N/A;  . LEFT HEART CATH AND CORONARY ANGIOGRAPHY N/A 12/08/2017   Procedure: LEFT HEART CATH  AND CORONARY ANGIOGRAPHY;  Surgeon: Leonie Man, MD;  Location: [place] CV LAB;  Service: Cardiovascular;  Laterality: N/A;  . LEFT HEART CATHETERIZATION WITH CORONARY ANGIOGRAM N/A 02/03/2014   Procedure: LEFT HEART CATHETERIZATION WITH CORONARY ANGIOGRAM;  Surgeon: Pixie Casino, MD;  Location: [facility] CATH LAB;  Service: Cardiovascular;  Laterality: N/A;  . left leg stent           Home Medications    Prior to Admission medications   Medication Sig Start Date End Date Taking? Authorizing Provider  aspirin 81 MG chewable tablet Chew 1 tablet (81 mg total) by mouth daily. 06/01/16  Yes Elwin Mocha, MD  atorvastatin (LIPITOR) 80 MG tablet Take 1 tablet (80 mg total) by mouth daily. 01/26/17  Yes Strader, [place], PA-C  carvedilol (COREG) 25 MG tablet Take 1 tablet (25 mg total) by mouth 2 (two) times daily with a meal. 01/26/17  Yes Strader, [place] M, PA-C  diclofenac (FLECTOR) 1.3 % PTCH APPLY 1 PATCH ONCE DAILY AS NEEDED FOR 30 DAYS 04/03/18  Yes [provider]  diclofenac sodium (VOLTAREN) 1 % GEL Apply 1 application topically 4 (four) times daily. 04/05/18  Yes Melynda Ripple, MD  DULoxetine (CYMBALTA) 60 MG capsule Take 1 capsule (60 mg total) by mouth daily. 03/08/18  Yes Marcial Pacas, MD  famotidine (PEPCID) 40 MG tablet Take 1 tablet (40 mg total) by mouth at bedtime. 06/18/17  Yes Barton Dubois, MD  furosemide (LASIX) 40 MG tablet Take 1 tablet (40 mg total) by mouth every evening. 12/09/17  Yes Velvet Bathe, MD  furosemide (LASIX) 80 MG tablet Take 1 tablet (80 mg total) by mouth daily. 12/10/17  Yes Velvet Bathe, MD  glipiZIDE (GLUCOTROL) 10 MG tablet Take 10 mg by mouth 2 (two) times daily. 05/30/17  Yes [provider]  hydrALAZINE (APRESOLINE) 25 MG tablet Take 3 tablets (75 mg total) by mouth 3 (three) times daily. 01/30/18  Yes Barrett, Evelene Croon, PA-C  Insulin Detemir (LEVEMIR) 100 UNIT/ML Pen Inject 20 Units into the skin daily at 10 pm. Patient taking  differently: Inject 55 Units into the skin daily at 10 pm.  06/18/17  Yes Barton Dubois, MD  isosorbide dinitrate (ISORDIL) 30 MG tablet Take 1 tablet (30 mg total) by mouth every morning. 12/10/17  Yes Velvet Bathe, MD  lisinopril (PRINIVIL,ZESTRIL) 10 MG tablet Take 1 tablet (10 mg total) by mouth daily. 12/10/17  Yes Velvet Bathe, MD  LYRICA 100 MG capsule Take 100 mg by mouth 2 (two) times daily. 11/29/17  Yes [provider]  metFORMIN (GLUCOPHAGE-XR) 500 MG 24 hr tablet Take 2 tablets (1,000 mg total) by mouth 2 (two) times daily. 07/02/17  Yes Barton Dubois, MD  oxyCODONE (ROXICODONE) 15 MG immediate release tablet Take 15 mg 3 (three) times daily as needed by mouth for pain.  08/28/17  Yes [provider]  potassium chloride SA (  K-DUR,KLOR-CON) 20 MEQ tablet Take 40 mEq by mouth daily.    Yes [provider]  sertraline (ZOLOFT) 50 MG tablet Take 50 mg by mouth daily.   Yes [provider]  sitaGLIPtin (JANUVIA) 100 MG tablet Take 100 mg by mouth daily.   Yes [provider]  tiZANidine (ZANAFLEX) 2 MG tablet Take 2 mg by mouth 2 (two) times daily as needed for pain.   Yes [provider]  traZODone (DESYREL) 50 MG tablet Take 1 tablet (50 mg total) by mouth at bedtime. 12/15/16  Yes Burns, Arloa Koh, MD  doxycycline (VIBRAMYCIN) 100 MG capsule Take 1 capsule (100 mg total) by mouth 2 (two) times daily. Patient not taking: Reported on 05/15/2018 04/01/18   Glyn Ade, PA-C  hydrALAZINE (APRESOLINE) 50 MG tablet TAKE 1 TABLET (50 MG TOTAL) BY MOUTH 3 (THREE) TIMES DAILY. Patient not taking: Reported on 05/15/2018 03/27/18   Lelon Perla, MD  Insulin Pen Needle 31G X 5 MM MISC Use 1 needle daily to inject insulin as prescribed 06/18/17   Barton Dubois, MD  methocarbamol (ROBAXIN) 750 MG tablet Take 1 tablet (750 mg total) by mouth every 4 (four) hours. Patient not taking: Reported on 04/30/2018 04/05/18   Melynda Ripple, MD  nitroGLYCERIN  (NITROSTAT) 0.4 MG SL tablet Place 0.4 mg under the tongue every 5 (five) minutes as needed for chest pain. 12/17/16   [provider]  pantoprazole (PROTONIX) 40 MG tablet Take 1 tablet (40 mg total) by mouth 2 (two) times daily. Patient not taking: Reported on 05/15/2018 06/18/17   Barton Dubois, MD    Family History Family History  Problem Relation Age of Onset  . Cancer Father   . Hypertension Mother   . Diabetes Mother   . Breast cancer Mother   . Hypertension Brother   . Diabetes Brother   . Hypertension Sister   . Diabetes Sister     Social History Social History   Tobacco Use  . Smoking status: Never Smoker  . Smokeless tobacco: Never Used  Substance Use Topics  . Alcohol use: No  . Drug use: No     Allergies   Coconut flavor [flavoring agent]; Coconut oil; Ibuprofen; Aleve [naproxen]; and Nsaids   Review of Systems Review of Systems  Constitutional: Negative for fever.  Eyes: Negative for visual disturbance.  Respiratory: Negative for shortness of breath.   Cardiovascular: Negative for chest pain.  Gastrointestinal: Negative for abdominal pain.  Genitourinary: Negative for flank pain.  Musculoskeletal: Positive for back pain.  Skin: Negative for wound.  Neurological: Negative for dizziness, weakness, light-headedness, numbness and headaches.     Physical Exam Updated Vital Signs BP (!) 173/101 (BP Location: Right Arm)   Pulse 88   Temp 98.9 F (37.2 C) (Oral)   Resp 20   SpO2 96%   Physical Exam  Constitutional: He is oriented to person, place, and time. He appears well-developed and well-nourished. No distress.  HENT:  Mouth/Throat: Oropharynx is clear and moist.  Eyes: Conjunctivae are normal.  Neck: Neck supple.  Cardiovascular: Normal rate, regular rhythm, normal heart sounds and intact distal pulses.  Pulmonary/Chest: Effort normal and breath sounds normal. No stridor. No respiratory distress. He has no wheezes.  Abdominal: Soft.  Bowel sounds are normal. There is no tenderness.  Musculoskeletal:  TTP to the lumbar spine, right paraspinous muscles, and right hip area.   Neurological: He is alert and oriented to person, place, and time.  Motor:  Normal  tone. 5/5 strength of BUE and BLE major muscle groups including strong and equal grip strength and dorsiflexion/plantar flexion Sensory: light touch normal in all extremities. Gait: normal gait and balance. CV: 2+ radial and DP/PT pulses  Skin: Skin is warm and dry. Capillary refill takes less than 2 seconds.  Psychiatric: He has a normal mood and affect.   ED Treatments / Results  Labs (all labs ordered are listed, but only abnormal results are displayed) Labs Reviewed - No data to display  EKG None  Radiology No results found.  Procedures Procedures (including critical care time)  Medications Ordered in ED Medications  ketorolac (TORADOL) 15 MG/ML injection 15 mg (has no administration in time range)     Initial Impression / Assessment and Plan / ED Course  I have reviewed the triage vital signs and the nursing notes.  Pertinent labs & imaging results that were available during my care of the patient were reviewed by me and considered in my medical decision making (see chart for details).     Final Clinical Impressions(s) / ED Diagnoses   Final diagnoses:  Chronic low back pain with right-sided sciatica, unspecified back pain laterality  Right hip pain   Patient with back pain.  No neurological deficits and normal neuro exam.  Patient can walk but states is painful.  No loss of bowel or bladder control.  No concern for cauda equina.  No fever, night sweats, weight loss, h/o cancer, IVDU.  RICE protocol and pain medicine indicated and discussed with patient.  Patient has elevated blood pressure today.  No chest pain, shortness of breath, headaches to suggest hypertensive emergency.  Advised patient to take his normal blood pressure medications when  he gets home.  Suspect that his pressures are also elevated due to his pain.  Doubt AAA.  He has an appointment with his orthopedist in 1 week for follow-up.  Advised him to follow-up as directed and return if he has any new or worsening symptoms in the meantime.  All questions answered and patient understands plan reasons to return immediately to the ED.   ED Discharge Orders    None       Rodney Booze, [place] 05/15/18 1653    Malvin Johns, MD 05/15/18 1941

## 2018-05-15 NOTE — ED Triage Notes (Signed)
Patient here from home with complaints of right hip pain and lower back pain. Hx of same. Ambulatory.

## 2018-05-15 NOTE — Discharge Instructions (Addendum)
Please complete stretches at home and use warm/cold compresses for your symptoms. Follow up with your orthopedist next week and return to the ER for any new or worsening symptoms.

## 2018-05-17 ENCOUNTER — Other Ambulatory Visit: Payer: Self-pay

## 2018-05-17 ENCOUNTER — Emergency Department (HOSPITAL_COMMUNITY)
Admission: EM | Admit: 2018-05-17 | Discharge: 2018-05-17 | Disposition: A | Discharge status: Home / Self Care | Payer: Medicaid Other | Attending: Emergency Medicine | Admitting: Emergency Medicine

## 2018-05-17 ENCOUNTER — Encounter (HOSPITAL_COMMUNITY): Payer: Self-pay

## 2018-05-17 ENCOUNTER — Emergency Department (HOSPITAL_COMMUNITY): Payer: Medicaid Other

## 2018-05-17 DIAGNOSIS — E1140 Type 2 diabetes mellitus with diabetic neuropathy, unspecified: Secondary | ICD-10-CM | POA: Insufficient documentation

## 2018-05-17 DIAGNOSIS — Z8673 Personal history of transient ischemic attack (TIA), and cerebral infarction without residual deficits: Secondary | ICD-10-CM | POA: Insufficient documentation

## 2018-05-17 DIAGNOSIS — L03032 Cellulitis of left toe: Secondary | ICD-10-CM

## 2018-05-17 DIAGNOSIS — I5032 Chronic diastolic (congestive) heart failure: Secondary | ICD-10-CM | POA: Insufficient documentation

## 2018-05-17 DIAGNOSIS — L089 Local infection of the skin and subcutaneous tissue, unspecified: Secondary | ICD-10-CM | POA: Diagnosis not present

## 2018-05-17 DIAGNOSIS — Z79899 Other long term (current) drug therapy: Secondary | ICD-10-CM | POA: Diagnosis not present

## 2018-05-17 DIAGNOSIS — Z794 Long term (current) use of insulin: Secondary | ICD-10-CM | POA: Diagnosis not present

## 2018-05-17 DIAGNOSIS — I2510 Atherosclerotic heart disease of native coronary artery without angina pectoris: Secondary | ICD-10-CM | POA: Insufficient documentation

## 2018-05-17 DIAGNOSIS — Z7982 Long term (current) use of aspirin: Secondary | ICD-10-CM | POA: Diagnosis not present

## 2018-05-17 DIAGNOSIS — I110 Hypertensive heart disease with heart failure: Secondary | ICD-10-CM | POA: Diagnosis not present

## 2018-05-17 DIAGNOSIS — M79675 Pain in left toe(s): Secondary | ICD-10-CM | POA: Diagnosis present

## 2018-05-17 LAB — COMPREHENSIVE METABOLIC PANEL
ALT: 29 U/L (ref 0–44)
AST: 24 U/L (ref 15–41)
Albumin: 3.4 g/dL — ABNORMAL LOW (ref 3.5–5.0)
Alkaline Phosphatase: 35 U/L — ABNORMAL LOW (ref 38–126)
Anion gap: 9 (ref 5–15)
BUN: 11 mg/dL (ref 6–20)
CO2: 27 mmol/L (ref 22–32)
Calcium: 8.7 mg/dL — ABNORMAL LOW (ref 8.9–10.3)
Chloride: 106 mmol/L (ref 98–111)
Creatinine, Ser: 0.91 mg/dL (ref 0.61–1.24)
GFR calc Af Amer: >60 mL/min (ref >60)
GFR calc non Af Amer: >60 mL/min (ref >60)
Glucose, Bld: 158 mg/dL — ABNORMAL HIGH (ref 70–99)
Potassium: 3.7 mmol/L (ref 3.5–5.1)
Sodium: 142 mmol/L (ref 135–145)
Total Bilirubin: 0.7 mg/dL (ref 0.3–1.2)
Total Protein: 6 g/dL — ABNORMAL LOW (ref 6.5–8.1)

## 2018-05-17 LAB — CBC WITH DIFFERENTIAL/PLATELET
Abs Immature Granulocytes: 0.1 10*3/uL (ref 0.0–0.1)
Basophils Absolute: 0 10*3/uL (ref 0.0–0.1)
Basophils Relative: 0 %
Eosinophils Absolute: 0.1 10*3/uL (ref 0.0–0.7)
Eosinophils Relative: 1 %
HCT: 33.1 % — ABNORMAL LOW (ref 39.0–52.0)
Hemoglobin: 10.2 g/dL — ABNORMAL LOW (ref 13.0–17.0)
Immature Granulocytes: 1 %
Lymphocytes Relative: 22 %
Lymphs Abs: 2.4 10*3/uL (ref 0.7–4.0)
MCH: 29.1 pg (ref 26.0–34.0)
MCHC: 30.8 g/dL (ref 30.0–36.0)
MCV: 94.3 fL (ref 78.0–100.0)
Monocytes Absolute: 0.8 10*3/uL (ref 0.1–1.0)
Monocytes Relative: 7 %
Neutro Abs: 7.4 10*3/uL (ref 1.7–7.7)
Neutrophils Relative %: 69 %
Platelets: 235 10*3/uL (ref 150–400)
RBC: 3.51 MIL/uL — ABNORMAL LOW (ref 4.22–5.81)
RDW: 14.7 % (ref 11.5–15.5)
WBC: 10.7 10*3/uL — ABNORMAL HIGH (ref 4.0–10.5)

## 2018-05-17 MED ORDER — SULFAMETHOXAZOLE-TRIMETHOPRIM 800-160 MG PO TABS
1.0000 | ORAL_TABLET | Freq: Once | ORAL | Status: AC
  Administered 2018-05-17: 1 via ORAL
  Filled 2018-05-17: qty 1

## 2018-05-17 MED ORDER — CEPHALEXIN 500 MG PO CAPS: 500.0000 mg | ORAL_CAPSULE | Freq: Three times a day (TID) | ORAL | 0 refills | Status: AC

## 2018-05-17 MED ORDER — LIDOCAINE HCL 2 % IJ SOLN
10.0000 mL | Freq: Once | INTRAMUSCULAR | Status: AC
  Administered 2018-05-17: 200 mg via INTRADERMAL
  Filled 2018-05-17: qty 20

## 2018-05-17 MED ORDER — CEPHALEXIN 250 MG PO CAPS
500.0000 mg | ORAL_CAPSULE | Freq: Once | ORAL | Status: AC
  Administered 2018-05-17: 500 mg via ORAL
  Filled 2018-05-17: qty 2

## 2018-05-17 MED ORDER — SULFAMETHOXAZOLE-TRIMETHOPRIM 800-160 MG PO TABS: 1.0000 | ORAL_TABLET | Freq: Two times a day (BID) | ORAL | 0 refills | Status: AC

## 2018-05-17 MED ORDER — OXYCODONE-ACETAMINOPHEN 5-325 MG PO TABS
2.0000 | ORAL_TABLET | Freq: Once | ORAL | Status: AC
  Administered 2018-05-17: 2 via ORAL
  Filled 2018-05-17: qty 2

## 2018-05-17 MED ORDER — OXYCODONE-ACETAMINOPHEN 5-325 MG PO TABS: 1.0000 | ORAL_TABLET | Freq: Four times a day (QID) | ORAL | 0 refills | Status: DC | PRN

## 2018-05-17 NOTE — ED Triage Notes (Signed)
Pt endorses pain and redness to big toe on left foot. Pt has DM. VSS. Afebrile.

## 2018-05-17 NOTE — ED Provider Notes (Signed)
[facility] EMERGENCY DEPARTMENT Provider Note   CSN: [id number] Arrival date & time: 05/17/18  1100     History   Chief Complaint Chief Complaint  Patient presents with  . Toe Pain    HPI [name] is a 57 y.o. male.  HPI 57 year old male with past medical history as below including diabetes and morbid obesity who with left toe pain.  Patient states that over the last week, he is noticed pain and redness along the medial aspect of his left great toe.  He states that his home health nurse was trying to put on a stocking today when she hit it.  He endorses associated aching, throbbing, 8 out of 10 pain along the left toe.  He states the redness has mildly worsened over the last several days.  He denies any pain with movement of the toe.  Denies any drainage.  Denies any fevers, chills, nausea, vomiting, or signs of systemic illness.  Pain is worse with movement, weightbearing, and palpation.  No alleviating factors.  Past Medical History:  Diagnosis Date  . Anemia   . Arthritis   . Back pain   . CAD (coronary artery disease)    a. s/p DES to LAD in 05/2016  . Cervical radiculopathy   . Chronic diastolic CHF (congestive heart failure) ([place])   . Chronic pain   . Depression   . DVT (deep venous thrombosis) ([place])   . Hematemesis   . Hepatic steatosis   . Hyperlipidemia   . Hypertension   . IBS (irritable bowel syndrome)   . Morbid obesity ([place])   . OSA (obstructive sleep apnea)   . Pancreatitis   . PE (pulmonary thromboembolism) ([place])   . Peripheral neuropathy   . PUD (peptic ulcer disease)   . Renal disorder   . Stroke [facility])    a. ?details unclear - not seen on imaging when he was admitted in 05/2017 for TIA symptoms which were felt due to cervical radiculopathy.  . Thoracic aortic ectasia (HCC)    a. 4.3cm ectatic ascending thoracic aorta by CT 06/2017.   . Type 2 diabetes mellitus [facility])     Patient Active Problem List   Diagnosis Date Noted    . B12 deficiency 08/10/2017  . Persistent headaches 08/09/2017  . GERD (gastroesophageal reflux disease) 06/29/2017  . Left arm numbness   . Cerebral embolism with cerebral infarction 06/17/2017  . TIA (transient ischemic attack) 06/17/2017  . Chronic back pain 12/29/2016  . Depression 12/15/2016  . Vitamin D deficiency 12/09/2016  . Right hip pain 12/07/2016  . Hypokalemia 12/07/2016  . Type 2 diabetes mellitus with vascular disease ([place]) 05/31/2016  . Normocytic normochromic anemia 05/31/2016  . Chest pain 05/31/2016  . CAD (coronary artery disease) 01/06/2016  . DVT (deep venous thrombosis) ([place]) 01/06/2016  . Lactic acidosis 05/28/2014  . Nonspecific chest pain 01/29/2014  . Uncontrolled secondary diabetes with peripheral neuropathy ([place]) 01/29/2014  . Obesity, Class III, BMI 40-49.9 (morbid obesity) ([place]) 01/29/2014  . Snoring 01/29/2014  . Dyslipidemia 01/29/2014  . HTN (hypertension) 01/29/2014  . Abnormal nuclear stress test 01/29/2014    Past Surgical History:  Procedure Laterality Date  . CARDIAC CATHETERIZATION N/A 05/31/2016   Procedure: Left Heart Cath and Coronary Angiography;  Surgeon: Peter M Martinique, MD;  Location: [facility] CV LAB;  Service: Cardiovascular;  Laterality: N/A;  . CARDIAC CATHETERIZATION N/A 05/31/2016   Procedure: Intravascular Pressure Wire/FFR Study;  Surgeon: Peter M Martinique, MD;  Location: [facility] CV LAB;  Service: Cardiovascular;  Laterality: N/A;  . CARDIAC CATHETERIZATION N/A 05/31/2016   Procedure: Coronary Stent Intervention;  Surgeon: Peter M Martinique, MD;  Location: [facility] CV LAB;  Service: Cardiovascular;  Laterality: N/A;  . LEFT HEART CATH AND CORONARY ANGIOGRAPHY N/A 12/08/2017   Procedure: LEFT HEART CATH AND CORONARY ANGIOGRAPHY;  Surgeon: Leonie Man, MD;  Location: [facility] CV LAB;  Service: Cardiovascular;  Laterality: N/A;  . LEFT HEART CATHETERIZATION WITH CORONARY ANGIOGRAM N/A 02/03/2014   Procedure: LEFT HEART  CATHETERIZATION WITH CORONARY ANGIOGRAM;  Surgeon: Pixie Casino, MD;  Location: [facility] CATH LAB;  Service: Cardiovascular;  Laterality: N/A;  . left leg stent           Home Medications    Prior to Admission medications   Medication Sig Start Date End Date Taking? Authorizing Provider  aspirin 81 MG chewable tablet Chew 1 tablet (81 mg total) by mouth daily. 06/01/16   Elwin Mocha, MD  atorvastatin (LIPITOR) 80 MG tablet Take 1 tablet (80 mg total) by mouth daily. 01/26/17   Ahmed Prima, Fransisco Hertz, PA-C  carvedilol (COREG) 25 MG tablet Take 1 tablet (25 mg total) by mouth 2 (two) times daily with a meal. 01/26/17   Strader, [place] M, PA-C  cephALEXin (KEFLEX) 500 MG capsule Take 1 capsule (500 mg total) by mouth 3 (three) times daily for 7 days. 05/17/18 05/24/18  Duffy Bruce, MD  diclofenac (FLECTOR) 1.3 % PTCH APPLY 1 PATCH ONCE DAILY AS NEEDED FOR 30 DAYS 04/03/18   [provider]  diclofenac sodium (VOLTAREN) 1 % GEL Apply 1 application topically 4 (four) times daily. 04/05/18   Melynda Ripple, MD  doxycycline (VIBRAMYCIN) 100 MG capsule Take 1 capsule (100 mg total) by mouth 2 (two) times daily. Patient not taking: Reported on 05/15/2018 04/01/18   Glyn Ade, PA-C  DULoxetine (CYMBALTA) 60 MG capsule Take 1 capsule (60 mg total) by mouth daily. 03/08/18   Marcial Pacas, MD  famotidine (PEPCID) 40 MG tablet Take 1 tablet (40 mg total) by mouth at bedtime. 06/18/17   Barton Dubois, MD  furosemide (LASIX) 40 MG tablet Take 1 tablet (40 mg total) by mouth every evening. 12/09/17   Velvet Bathe, MD  furosemide (LASIX) 80 MG tablet Take 1 tablet (80 mg total) by mouth daily. 12/10/17   Velvet Bathe, MD  glipiZIDE (GLUCOTROL) 10 MG tablet Take 10 mg by mouth 2 (two) times daily. 05/30/17   [provider]  hydrALAZINE (APRESOLINE) 25 MG tablet Take 3 tablets (75 mg total) by mouth 3 (three) times daily. 01/30/18   Barrett, Evelene Croon, PA-C  hydrALAZINE (APRESOLINE) 50 MG tablet  TAKE 1 TABLET (50 MG TOTAL) BY MOUTH 3 (THREE) TIMES DAILY. Patient not taking: Reported on 05/15/2018 03/27/18   Lelon Perla, MD  Insulin Detemir (LEVEMIR) 100 UNIT/ML Pen Inject 20 Units into the skin daily at 10 pm. Patient taking differently: Inject 55 Units into the skin daily at 10 pm.  06/18/17   Barton Dubois, MD  Insulin Pen Needle 31G X 5 MM MISC Use 1 needle daily to inject insulin as prescribed 06/18/17   Barton Dubois, MD  isosorbide dinitrate (ISORDIL) 30 MG tablet Take 1 tablet (30 mg total) by mouth every morning. 12/10/17   Velvet Bathe, MD  lisinopril (PRINIVIL,ZESTRIL) 10 MG tablet Take 1 tablet (10 mg total) by mouth daily. 12/10/17   Velvet Bathe, MD  LYRICA 100 MG capsule Take 100 mg  by mouth 2 (two) times daily. 11/29/17   [provider]  metFORMIN (GLUCOPHAGE-XR) 500 MG 24 hr tablet Take 2 tablets (1,000 mg total) by mouth 2 (two) times daily. 07/02/17   Barton Dubois, MD  methocarbamol (ROBAXIN) 750 MG tablet Take 1 tablet (750 mg total) by mouth every 4 (four) hours. Patient not taking: Reported on 04/30/2018 04/05/18   Melynda Ripple, MD  nitroGLYCERIN (NITROSTAT) 0.4 MG SL tablet Place 0.4 mg under the tongue every 5 (five) minutes as needed for chest pain. 12/17/16   [provider]  oxyCODONE (ROXICODONE) 15 MG immediate release tablet Take 15 mg 3 (three) times daily as needed by mouth for pain.  08/28/17   [provider]  oxyCODONE-acetaminophen (PERCOCET/ROXICET) 5-325 MG tablet Take 1-2 tablets by mouth every 6 (six) hours as needed for moderate pain or severe pain. 05/17/18   Duffy Bruce, MD  pantoprazole (PROTONIX) 40 MG tablet Take 1 tablet (40 mg total) by mouth 2 (two) times daily. Patient not taking: Reported on 05/15/2018 06/18/17   Barton Dubois, MD  potassium chloride SA (K-DUR,KLOR-CON) 20 MEQ tablet Take 40 mEq by mouth daily.     [provider]  sertraline (ZOLOFT) 50 MG tablet Take 50 mg by mouth daily.     [provider]  sitaGLIPtin (JANUVIA) 100 MG tablet Take 100 mg by mouth daily.    [provider]  sulfamethoxazole-trimethoprim (BACTRIM DS,SEPTRA DS) 800-160 MG tablet Take 1 tablet by mouth 2 (two) times daily for 7 days. 05/17/18 05/24/18  Duffy Bruce, MD  tiZANidine (ZANAFLEX) 2 MG tablet Take 2 mg by mouth 2 (two) times daily as needed for pain.    [provider]  traZODone (DESYREL) 50 MG tablet Take 1 tablet (50 mg total) by mouth at bedtime. 12/15/16   Florinda Marker, MD    Family History Family History  Problem Relation Age of Onset  . Cancer Father   . Hypertension Mother   . Diabetes Mother   . Breast cancer Mother   . Hypertension Brother   . Diabetes Brother   . Hypertension Sister   . Diabetes Sister     Social History Social History   Tobacco Use  . Smoking status: Never Smoker  . Smokeless tobacco: Never Used  Substance Use Topics  . Alcohol use: No  . Drug use: No     Allergies   Coconut flavor [flavoring agent]; Coconut oil; Ibuprofen; Aleve [naproxen]; and Nsaids   Review of Systems Review of Systems  Constitutional: Negative for chills, fatigue and fever.  HENT: Negative for congestion and rhinorrhea.   Eyes: Negative for visual disturbance.  Respiratory: Negative for cough, shortness of breath and wheezing.   Cardiovascular: Negative for chest pain and leg swelling.  Gastrointestinal: Negative for abdominal pain, diarrhea, nausea and vomiting.  Genitourinary: Negative for dysuria and flank pain.  Musculoskeletal: Positive for gait problem. Negative for neck pain and neck stiffness.  Skin: Positive for rash and wound.  Allergic/Immunologic: Negative for immunocompromised state.  Neurological: Negative for syncope, weakness and headaches.  All other systems reviewed and are negative.    Physical Exam Updated Vital Signs BP 123/75   Pulse 63   Temp 97.9 F (36.6 C) (Oral)   Resp 18   Ht 5\' 10"  (1.778 m)   Wt  (!) 138.8 kg (306 lb)   SpO2 97%   BMI 43.91 kg/m   Physical Exam  Constitutional: He is oriented to person, place, and time. He  appears well-developed and well-nourished. No distress.  HENT:  Head: Normocephalic and atraumatic.  Eyes: Conjunctivae are normal.  Neck: Neck supple.  Cardiovascular: Normal rate, regular rhythm and normal heart sounds. Exam reveals no friction rub.  No murmur heard. Pulmonary/Chest: Effort normal and breath sounds normal. No respiratory distress. He has no wheezes. He has no rales.  Abdominal: He exhibits no distension.  Musculoskeletal: He exhibits no edema.  Neurological: He is alert and oriented to person, place, and time. He exhibits normal muscle tone.  Skin: Skin is warm. Capillary refill takes less than 2 seconds.  Psychiatric: He has a normal mood and affect.  Nursing note and vitals reviewed.   LOWER EXTREMITY EXAM: LEFT  INSPECTION & PALPATION: Moderate erythema along the distal aspect of the left great toe.  Significant onychomycosis noted with nail thickening, as well as bilateral ingrown aspects of the nail.  Along the medial aspect, there is moderate erythema and a small area of dried purulence.  No open ulcerations.  No pain with range of motion of the toe.  SENSORY: sensation is intact to light touch in:  Superficial peroneal nerve distribution (over dorsum of foot) Deep peroneal nerve distribution (over first dorsal web space) Sural nerve distribution (over lateral aspect 5th metatarsal) Saphenous nerve distribution (over medial instep)  MOTOR:  + Motor EHL (great toe dorsiflexion) + FHL (great toe plantar flexion)  + TA (ankle dorsiflexion)  + GSC (ankle plantar flexion)  VASCULAR: 2+ dorsalis pedis and posterior tibialis pulses Capillary refill < 2 sec, toes warm and well-perfused  COMPARTMENTS: Soft, warm, well-perfused No pain with passive extension No parethesias     ED Treatments / Results  Labs (all labs  ordered are listed, but only abnormal results are displayed) Labs Reviewed  COMPREHENSIVE METABOLIC PANEL - Abnormal; Notable for the following components:      Result Value   Glucose, Bld 158 (*)    Calcium 8.7 (*)    Total Protein 6.0 (*)    Albumin 3.4 (*)    Alkaline Phosphatase 35 (*)    All other components within normal limits  CBC WITH DIFFERENTIAL/PLATELET - Abnormal; Notable for the following components:   WBC 10.7 (*)    RBC 3.51 (*)    Hemoglobin 10.2 (*)    HCT 33.1 (*)    All other components within normal limits    EKG None  Radiology Dg Toe Great Left  Result Date: 05/17/2018 CLINICAL DATA:  Great toe pain. EXAM: LEFT GREAT TOE COMPARISON:  Left foot radiographs 10/12/2015 FINDINGS: No acute fracture, dislocation, or osseous erosion is seen. Mild-to-moderate marginal spurring at the first MTP joint is unchanged. There is also mild great toe IP joint spurring. No focal soft tissue abnormality is seen. IMPRESSION: Degenerative changes without acute osseous abnormality. Electronically Signed   By: Logan Bores M.D.   On: 05/17/2018 12:56    Procedures .Marland KitchenIncision and Drainage Date/Time: 05/17/2018 8:21 PM Performed by: Duffy Bruce, MD Authorized by: Duffy Bruce, MD   Consent:    Consent obtained:  Verbal   Consent given by:  Patient   Risks discussed:  Bleeding, damage to other organs, incomplete drainage, infection and pain   Alternatives discussed:  Alternative treatment and delayed treatment Location:    Type:  Abscess   Location: Left great toe. Pre-procedure details:    Skin preparation:  Betadine Anesthesia (see MAR for exact dosages):    Anesthesia method:  Local infiltration   Local anesthetic:  Lidocaine 1% WITH  epi Procedure type:    Complexity:  Simple Procedure details:    Incision types:  Single straight   Incision depth:  Dermal   Scalpel blade:  11   Wound management:  Probed and deloculated and irrigated with saline   Drainage:   Purulent   Drainage amount:  Moderate   Wound treatment:  Wound left open   Packing materials:  None Post-procedure details:    Patient tolerance of procedure:  Tolerated well, no immediate complications   (including critical care time)  Medications Ordered in ED Medications  oxyCODONE-acetaminophen (PERCOCET/ROXICET) 5-325 MG per tablet 2 tablet (2 tablets Oral Given 05/17/18 1219)  sulfamethoxazole-trimethoprim (BACTRIM DS,SEPTRA DS) 800-160 MG per tablet 1 tablet (1 tablet Oral Given 05/17/18 1219)  cephALEXin (KEFLEX) capsule 500 mg (500 mg Oral Given 05/17/18 1219)  lidocaine (XYLOCAINE) 2 % (with pres) injection 200 mg (200 mg Intradermal Given 05/17/18 1219)     Initial Impression / Assessment and Plan / ED Course  I have reviewed the triage vital signs and the nursing notes.  Pertinent labs & imaging results that were available during my care of the patient were reviewed by me and considered in my medical decision making (see chart for details).     57 year old male here with left great toe pain.  Exam is consistent with likely local infected ingrown toenail, complicated by mild paronychia.  Following a digital block, the wound was gently open with expression of moderate amount of purulence.  The incision was made along the medial nail fold, and was well approximated.  Will advise warm soaks, start on empiric antibiotics, and refer to podiatry.  Given the degree of his nail thickening and onychomycosis, nail removal will be difficult.  Advised him to wear diabetic shoes, return to his PCP in 48 hours for wound check.  Final Clinical Impressions(s) / ED Diagnoses   Final diagnoses:  Toe infection  Paronychia of great toe of left foot    ED Discharge Orders        Ordered    oxyCODONE-acetaminophen (PERCOCET/ROXICET) 5-325 MG tablet  Every 6 hours PRN     05/17/18 1357    cephALEXin (KEFLEX) 500 MG capsule  3 times daily     05/17/18 1357    sulfamethoxazole-trimethoprim  (BACTRIM DS,SEPTRA DS) 800-160 MG tablet  2 times daily     05/17/18 1357       Duffy Bruce, MD 05/17/18 2023

## 2018-05-18 ENCOUNTER — Encounter (HOSPITAL_COMMUNITY): Payer: Self-pay

## 2018-05-18 ENCOUNTER — Emergency Department (HOSPITAL_COMMUNITY)
Admission: EM | Admit: 2018-05-18 | Discharge: 2018-05-19 | Disposition: A | Discharge status: Home / Self Care | Payer: Medicaid Other | Attending: Emergency Medicine | Admitting: Emergency Medicine

## 2018-05-18 DIAGNOSIS — Z8673 Personal history of transient ischemic attack (TIA), and cerebral infarction without residual deficits: Secondary | ICD-10-CM | POA: Insufficient documentation

## 2018-05-18 DIAGNOSIS — M79675 Pain in left toe(s): Secondary | ICD-10-CM

## 2018-05-18 DIAGNOSIS — Z79899 Other long term (current) drug therapy: Secondary | ICD-10-CM | POA: Insufficient documentation

## 2018-05-18 DIAGNOSIS — I110 Hypertensive heart disease with heart failure: Secondary | ICD-10-CM | POA: Diagnosis not present

## 2018-05-18 DIAGNOSIS — Z794 Long term (current) use of insulin: Secondary | ICD-10-CM | POA: Diagnosis not present

## 2018-05-18 DIAGNOSIS — Z7982 Long term (current) use of aspirin: Secondary | ICD-10-CM | POA: Diagnosis not present

## 2018-05-18 DIAGNOSIS — I2510 Atherosclerotic heart disease of native coronary artery without angina pectoris: Secondary | ICD-10-CM | POA: Diagnosis not present

## 2018-05-18 DIAGNOSIS — E119 Type 2 diabetes mellitus without complications: Secondary | ICD-10-CM | POA: Insufficient documentation

## 2018-05-18 DIAGNOSIS — I5032 Chronic diastolic (congestive) heart failure: Secondary | ICD-10-CM | POA: Insufficient documentation

## 2018-05-18 NOTE — ED Triage Notes (Signed)
Pt states that he was here yesterday for ingrown tonail on L foot, put on antibiotics, hx of DM, pain is not better

## 2018-05-19 ENCOUNTER — Encounter (HOSPITAL_COMMUNITY): Payer: Self-pay | Admitting: Student

## 2018-05-19 NOTE — Discharge Instructions (Signed)
You were seen in the emergency department today for a recheck of your left toe.  We recommend that you continue the medication prescribed at your visit yesterday.  We would like you to follow-up with the podiatrist within the next 48 hours.  We have given you information for multiple podiatrist in the area of your podiatrist is unable to see you sooner than August 7.  You unable to see a podiatrist he would like you to see your primary care provider.  Return to the ER for worsening pain, spreading redness, fever, numbness, or any other concerns.

## 2018-05-19 NOTE — ED Provider Notes (Signed)
[place] EMERGENCY DEPARTMENT Provider Note   CSN: [id number] Arrival date & time: 05/18/18  1951     History   Chief Complaint Chief Complaint  Patient presents with  . Toe Pain    HPI [name] is a 57 y.o. male with past medical history as below including diabetes and morbid obesity who returns to the emergency department for left great toe pain and swelling.  He was seen in the emergency department yesterday after about 1 week of left great toe medial pain and redness -previous note reviewed, exam appeared to be consistent with local infection of an ingrown toenail complicated by a mild paronychia-I&D performed at the bedside, patient discharged home recommendations for warm soaks and prescriptions for Keflex, Bactrim, and Percocet with 48 hours follow up with PCP. He states he has been taking medication as prescribed and utilizing warm soaks with improvement.  He states that tonight after he was up on his feet a lot cooking dinner he had some increased swelling and discomfort to the medial aspect of the toe, he did not try percocet or warm soaks after this increase in sxs.  He has not had any purulent drainage.  He has not noticed return of redness.  Denies fevers. No other specific alleviating or aggravating factors. he states that he called his podiatrist and is unable to be seen until August 7.   HPI  Past Medical History:  Diagnosis Date  . Anemia   . Arthritis   . Back pain   . CAD (coronary artery disease)    a. s/p DES to LAD in 05/2016  . Cervical radiculopathy   . Chronic diastolic CHF (congestive heart failure) ([place])   . Chronic pain   . Depression   . DVT (deep venous thrombosis) ([place])   . Hematemesis   . Hepatic steatosis   . Hyperlipidemia   . Hypertension   . IBS (irritable bowel syndrome)   . Morbid obesity ([place])   . OSA (obstructive sleep apnea)   . Pancreatitis   . PE (pulmonary thromboembolism) ([place])   . Peripheral  neuropathy   . PUD (peptic ulcer disease)   . Renal disorder   . Stroke [facility])    a. ?details unclear - not seen on imaging when he was admitted in 05/2017 for TIA symptoms which were felt due to cervical radiculopathy.  . Thoracic aortic ectasia (H[facility])    a. 4.3cm ectatic ascending thoracic aorta by CT 06/2017.   . Type 2 diabetes mellitus [facility])     Patient Active Problem List   Diagnosis Date Noted  . B12 deficiency 08/10/2017  . Persistent headaches 08/09/2017  . GERD (gastroesophageal reflux disease) 06/29/2017  . Left arm numbness   . Cerebral embolism with cerebral infarction 06/17/2017  . TIA (transient ischemic attack) 06/17/2017  . Chronic back pain 12/29/2016  . Depression 12/15/2016  . Vitamin D deficiency 12/09/2016  . Right hip pain 12/07/2016  . Hypokalemia 12/07/2016  . Type 2 diabetes mellitus with vascular disease ([place]) 05/31/2016  . Normocytic normochromic anemia 05/31/2016  . Chest pain 05/31/2016  . CAD (coronary artery disease) 01/06/2016  . DVT (deep venous thrombosis) ([place]) 01/06/2016  . Lactic acidosis 05/28/2014  . Nonspecific chest pain 01/29/2014  . Uncontrolled secondary diabetes with peripheral neuropathy ([place]) 01/29/2014  . Obesity, Class III, BMI 40-49.9 (morbid obesity) ([place]) 01/29/2014  . Snoring 01/29/2014  . Dyslipidemia 01/29/2014  . HTN (hypertension) 01/29/2014  . Abnormal nuclear stress test 01/29/2014  Past Surgical History:  Procedure Laterality Date  . CARDIAC CATHETERIZATION N/A 05/31/2016   Procedure: Left Heart Cath and Coronary Angiography;  Surgeon: Peter M Martinique, MD;  Location: [place] CV LAB;  Service: Cardiovascular;  Laterality: N/A;  . CARDIAC CATHETERIZATION N/A 05/31/2016   Procedure: Intravascular Pressure Wire/FFR Study;  Surgeon: Peter M Martinique, MD;  Location: [place] CV LAB;  Service: Cardiovascular;  Laterality: N/A;  . CARDIAC CATHETERIZATION N/A 05/31/2016   Procedure: Coronary Stent Intervention;  Surgeon:  Peter M Martinique, MD;  Location: [place] CV LAB;  Service: Cardiovascular;  Laterality: N/A;  . LEFT HEART CATH AND CORONARY ANGIOGRAPHY N/A 12/08/2017   Procedure: LEFT HEART CATH AND CORONARY ANGIOGRAPHY;  Surgeon: Leonie Man, MD;  Location: [place] CV LAB;  Service: Cardiovascular;  Laterality: N/A;  . LEFT HEART CATHETERIZATION WITH CORONARY ANGIOGRAM N/A 02/03/2014   Procedure: LEFT HEART CATHETERIZATION WITH CORONARY ANGIOGRAM;  Surgeon: Pixie Casino, MD;  Location: [facility] CATH LAB;  Service: Cardiovascular;  Laterality: N/A;  . left leg stent           Home Medications    Prior to Admission medications   Medication Sig Start Date End Date Taking? Authorizing Provider  aspirin 81 MG chewable tablet Chew 1 tablet (81 mg total) by mouth daily. 06/01/16   Elwin Mocha, MD  atorvastatin (LIPITOR) 80 MG tablet Take 1 tablet (80 mg total) by mouth daily. 01/26/17   Ahmed Prima, Fransisco Hertz, PA-C  carvedilol (COREG) 25 MG tablet Take 1 tablet (25 mg total) by mouth 2 (two) times daily with a meal. 01/26/17   Strader, [place] M, PA-C  cephALEXin (KEFLEX) 500 MG capsule Take 1 capsule (500 mg total) by mouth 3 (three) times daily for 7 days. 05/17/18 05/24/18  Duffy Bruce, MD  diclofenac (FLECTOR) 1.3 % PTCH APPLY 1 PATCH ONCE DAILY AS NEEDED FOR 30 DAYS 04/03/18   [provider]  diclofenac sodium (VOLTAREN) 1 % GEL Apply 1 application topically 4 (four) times daily. 04/05/18   Melynda Ripple, MD  DULoxetine (CYMBALTA) 60 MG capsule Take 1 capsule (60 mg total) by mouth daily. 03/08/18   Marcial Pacas, MD  famotidine (PEPCID) 40 MG tablet Take 1 tablet (40 mg total) by mouth at bedtime. 06/18/17   Barton Dubois, MD  furosemide (LASIX) 40 MG tablet Take 1 tablet (40 mg total) by mouth every evening. 12/09/17   Velvet Bathe, MD  furosemide (LASIX) 80 MG tablet Take 1 tablet (80 mg total) by mouth daily. 12/10/17   Velvet Bathe, MD  glipiZIDE (GLUCOTROL) 10 MG tablet Take 10 mg by  mouth 2 (two) times daily. 05/30/17   [provider]  hydrALAZINE (APRESOLINE) 25 MG tablet Take 3 tablets (75 mg total) by mouth 3 (three) times daily. 01/30/18   Barrett, Evelene Croon, PA-C  hydrALAZINE (APRESOLINE) 50 MG tablet TAKE 1 TABLET (50 MG TOTAL) BY MOUTH 3 (THREE) TIMES DAILY. Patient not taking: Reported on 05/15/2018 03/27/18   Lelon Perla, MD  Insulin Detemir (LEVEMIR) 100 UNIT/ML Pen Inject 20 Units into the skin daily at 10 pm. Patient taking differently: Inject 55 Units into the skin daily at 10 pm.  06/18/17   Barton Dubois, MD  Insulin Pen Needle 31G X 5 MM MISC Use 1 needle daily to inject insulin as prescribed 06/18/17   Barton Dubois, MD  isosorbide dinitrate (ISORDIL) 30 MG tablet Take 1 tablet (30 mg total) by mouth every morning. 12/10/17   Velvet Bathe, MD  lisinopril (PRINIVIL,ZESTRIL) 10 MG tablet Take 1 tablet (10 mg total) by mouth daily. 12/10/17   Velvet Bathe, MD  LYRICA 100 MG capsule Take 100 mg by mouth 2 (two) times daily. 11/29/17   [provider]  metFORMIN (GLUCOPHAGE-XR) 500 MG 24 hr tablet Take 2 tablets (1,000 mg total) by mouth 2 (two) times daily. 07/02/17   Barton Dubois, MD  methocarbamol (ROBAXIN) 750 MG tablet Take 1 tablet (750 mg total) by mouth every 4 (four) hours. Patient not taking: Reported on 04/30/2018 04/05/18   Melynda Ripple, MD  nitroGLYCERIN (NITROSTAT) 0.4 MG SL tablet Place 0.4 mg under the tongue every 5 (five) minutes as needed for chest pain. 12/17/16   [provider]  oxyCODONE (ROXICODONE) 15 MG immediate release tablet Take 15 mg 3 (three) times daily as needed by mouth for pain.  08/28/17   [provider]  oxyCODONE-acetaminophen (PERCOCET/ROXICET) 5-325 MG tablet Take 1-2 tablets by mouth every 6 (six) hours as needed for moderate pain or severe pain. 05/17/18   Duffy Bruce, MD  pantoprazole (PROTONIX) 40 MG tablet Take 1 tablet (40 mg total) by mouth 2 (two) times daily. Patient not taking:  Reported on 05/15/2018 06/18/17   Barton Dubois, MD  potassium chloride SA (K-DUR,KLOR-CON) 20 MEQ tablet Take 40 mEq by mouth daily.     [provider]  sertraline (ZOLOFT) 50 MG tablet Take 50 mg by mouth daily.    [provider]  sitaGLIPtin (JANUVIA) 100 MG tablet Take 100 mg by mouth daily.    [provider]  sulfamethoxazole-trimethoprim (BACTRIM DS,SEPTRA DS) 800-160 MG tablet Take 1 tablet by mouth 2 (two) times daily for 7 days. 05/17/18 05/24/18  Duffy Bruce, MD  tiZANidine (ZANAFLEX) 2 MG tablet Take 2 mg by mouth 2 (two) times daily as needed for pain.    [provider]  traZODone (DESYREL) 50 MG tablet Take 1 tablet (50 mg total) by mouth at bedtime. 12/15/16   Florinda Marker, MD    Family History Family History  Problem Relation Age of Onset  . Cancer Father   . Hypertension Mother   . Diabetes Mother   . Breast cancer Mother   . Hypertension Brother   . Diabetes Brother   . Hypertension Sister   . Diabetes Sister     Social History Social History   Tobacco Use  . Smoking status: Never Smoker  . Smokeless tobacco: Never Used  Substance Use Topics  . Alcohol use: No  . Drug use: No     Allergies   Coconut flavor [flavoring agent]; Coconut oil; Ibuprofen; Aleve [naproxen]; and Nsaids   Review of Systems Review of Systems  Constitutional: Negative for chills and fever.  Musculoskeletal:       Positive for left great toe pain and swelling.  Skin: Negative for color change.  Neurological: Negative for weakness and numbness.     Physical Exam Updated Vital Signs BP (!) 149/85 (BP Location: Right Arm)   Pulse 69   Temp 98.4 F (36.9 C) (Oral)   Resp 18   SpO2 98%   Physical Exam  Constitutional: He appears well-developed and well-nourished. No distress.  HENT:  Head: Normocephalic and atraumatic.  Eyes: Conjunctivae are normal. Right eye exhibits no discharge. Left eye exhibits no discharge.  Cardiovascular:    2+ symmetric DP and PT pulses.  Musculoskeletal:  Lower extremities: Patient has significant onychomycotic changes to the nails with associated thickening.  The left great toenail appears  ingrown medially and laterally.  There is some mild swelling to the medial aspect, however there is no erythema, warmth, purulent drainage, or palpable fluctuance.  No open wounds.  He has normal range of motion to bilateral ankles and all digits.  Medial aspect of the toe is somewhat tender to palpation.  Neurovascularly intact distally.  Brisk capillary refill.    Neurological: He is alert.  Clear speech.  Sensation grossly intact bilateral lower extremities.  5 out of 5 strength plantar dorsiflexion bilaterally.  Gait is intact.  Skin: Skin is warm and dry.  Psychiatric: He has a normal mood and affect. His behavior is normal. Thought content normal.  Nursing note and vitals reviewed.    ED Treatments / Results  Labs (all labs ordered are listed, but only abnormal results are displayed) Labs Reviewed - No data to display  EKG None  Radiology Dg Toe Great Left  Result Date: 05/17/2018 CLINICAL DATA:  Great toe pain. EXAM: LEFT GREAT TOE COMPARISON:  Left foot radiographs 10/12/2015 FINDINGS: No acute fracture, dislocation, or osseous erosion is seen. Mild-to-moderate marginal spurring at the first MTP joint is unchanged. There is also mild great toe IP joint spurring. No focal soft tissue abnormality is seen. IMPRESSION: Degenerative changes without acute osseous abnormality. Electronically Signed   By: Logan Bores M.D.   On: 05/17/2018 12:56    Procedures Procedures (including critical care time)  Medications Ordered in ED Medications - No data to display   Initial Impression / Assessment and Plan / ED Course  I have reviewed the triage vital signs and the nursing notes.  Pertinent labs & imaging results that were available during my care of the patient were reviewed by me and considered  in my medical decision making (see chart for details).   Patient returns to the emergency department for pain/swelling to the left toe after being on his feet and cooking dinner.  His ED visit yesterday has been reviewed, nonspecific leukocytosis at 10.7, glucose 158.  X-ray obtained negative for acute osseous abnormality.  Incision and drainage performed at bedside for mild paronychia complicating likely locally infected ingrown toenail.  He was discharged home with antibiotics and Percocet with recommendation for warm soaks.  This regimen seemed to be helping until he was on his feet cooking dinner.  He did not retry warm soaks or Percocet after this increase in pain/swelling.  On exam nail is fairly thick with onychomycosis changes, mild swelling and tenderness to the medial aspect of the toe, no erythema/warmth/drainage/ fluctuance, do not feel that repeat I&D is necessary.  Infection does not appear to be worsened, it appears improved upon review of note from yesterday.  Doubtful of underlying osteomyelitis.  Given the onychomycosis and thickness of the nail foresee difficult nail removal, do not feel that this needs to be emergently performed at this time.  Recommended continuation of his antibiotics and warm soaks.  Information for alternative podiatrist in the area given.  Follow-up for recheck on Monday, if unable to see podiatry would like him to see PCP. I discussed  treatment plan, need for follow-up, and return precautions with the patient. Provided opportunity for questions, patient confirmed understanding and is in agreement with plan.    Final Clinical Impressions(s) / ED Diagnoses   Final diagnoses:  Pain of toe of left foot    ED Discharge Orders    None       Amaryllis Dyke, PA-C 37/16/96 [id number]    Delora Fuel, MD  05/19/18 [id number]  

## 2018-05-19 NOTE — ED Notes (Signed)
Pt ready for discharge VS documented 

## 2018-05-19 NOTE — ED Notes (Signed)
See providers notes

## 2018-06-05 ENCOUNTER — Encounter (HOSPITAL_COMMUNITY): Payer: Self-pay

## 2018-06-05 ENCOUNTER — Emergency Department (HOSPITAL_COMMUNITY)
Admission: EM | Admit: 2018-06-05 | Discharge: 2018-06-06 | Disposition: A | Discharge status: Home / Self Care | Payer: Medicaid Other | Attending: Emergency Medicine | Admitting: Emergency Medicine

## 2018-06-05 ENCOUNTER — Other Ambulatory Visit: Payer: Self-pay

## 2018-06-05 DIAGNOSIS — E119 Type 2 diabetes mellitus without complications: Secondary | ICD-10-CM | POA: Diagnosis not present

## 2018-06-05 DIAGNOSIS — R1033 Periumbilical pain: Secondary | ICD-10-CM | POA: Diagnosis present

## 2018-06-05 DIAGNOSIS — I2510 Atherosclerotic heart disease of native coronary artery without angina pectoris: Secondary | ICD-10-CM | POA: Insufficient documentation

## 2018-06-05 DIAGNOSIS — I5032 Chronic diastolic (congestive) heart failure: Secondary | ICD-10-CM | POA: Insufficient documentation

## 2018-06-05 DIAGNOSIS — I10 Essential (primary) hypertension: Secondary | ICD-10-CM | POA: Diagnosis not present

## 2018-06-05 DIAGNOSIS — K580 Irritable bowel syndrome with diarrhea: Secondary | ICD-10-CM

## 2018-06-05 DIAGNOSIS — Z7984 Long term (current) use of oral hypoglycemic drugs: Secondary | ICD-10-CM | POA: Diagnosis not present

## 2018-06-05 DIAGNOSIS — R072 Precordial pain: Secondary | ICD-10-CM | POA: Diagnosis not present

## 2018-06-05 DIAGNOSIS — Z86711 Personal history of pulmonary embolism: Secondary | ICD-10-CM | POA: Insufficient documentation

## 2018-06-05 DIAGNOSIS — Z86718 Personal history of other venous thrombosis and embolism: Secondary | ICD-10-CM | POA: Insufficient documentation

## 2018-06-05 DIAGNOSIS — Z79899 Other long term (current) drug therapy: Secondary | ICD-10-CM | POA: Diagnosis not present

## 2018-06-05 NOTE — ED Notes (Signed)
Attempted to obtain EKG on 2 different monitors. Attempted EKG on portable EKG machine, unsuccessful. Pt states they always have a difficult time obtaining EKGs.

## 2018-06-06 ENCOUNTER — Emergency Department (HOSPITAL_COMMUNITY): Payer: Medicaid Other

## 2018-06-06 ENCOUNTER — Encounter (HOSPITAL_COMMUNITY): Payer: Self-pay

## 2018-06-06 LAB — I-STAT CHEM 8, ED
BUN: 15 mg/dL (ref 6–20)
Calcium, Ion: 1.03 mmol/L — ABNORMAL LOW (ref 1.15–1.40)
Chloride: 97 mmol/L — ABNORMAL LOW (ref 98–111)
Creatinine, Ser: 0.8 mg/dL (ref 0.61–1.24)
Glucose, Bld: 232 mg/dL — ABNORMAL HIGH (ref 70–99)
HCT: 40 % (ref 39.0–52.0)
Hemoglobin: 13.6 g/dL (ref 13.0–17.0)
Potassium: 3.7 mmol/L (ref 3.5–5.1)
Sodium: 139 mmol/L (ref 135–145)
TCO2: 27 mmol/L (ref 22–32)

## 2018-06-06 LAB — CBC WITH DIFFERENTIAL/PLATELET
Basophils Absolute: 0 10*3/uL (ref 0.0–0.1)
Basophils Relative: 0 %
Eosinophils Absolute: 0.1 10*3/uL (ref 0.0–0.7)
Eosinophils Relative: 1 %
HCT: 38.9 % — ABNORMAL LOW (ref 39.0–52.0)
Hemoglobin: 12.6 g/dL — ABNORMAL LOW (ref 13.0–17.0)
Lymphocytes Relative: 18 %
Lymphs Abs: 2.1 10*3/uL (ref 0.7–4.0)
MCH: 29.6 pg (ref 26.0–34.0)
MCHC: 32.4 g/dL (ref 30.0–36.0)
MCV: 91.3 fL (ref 78.0–100.0)
Monocytes Absolute: 0.9 10*3/uL (ref 0.1–1.0)
Monocytes Relative: 8 %
Neutro Abs: 8.5 10*3/uL — ABNORMAL HIGH (ref 1.7–7.7)
Neutrophils Relative %: 73 %
Platelets: 288 10*3/uL (ref 150–400)
RBC: 4.26 MIL/uL (ref 4.22–5.81)
RDW: 14.2 % (ref 11.5–15.5)
WBC: 11.6 10*3/uL — ABNORMAL HIGH (ref 4.0–10.5)

## 2018-06-06 LAB — URINALYSIS, ROUTINE W REFLEX MICROSCOPIC
Bilirubin Urine: NEGATIVE
Glucose, UA: 150 mg/dL — AB
Hgb urine dipstick: NEGATIVE
Ketones, ur: NEGATIVE mg/dL
Leukocytes, UA: NEGATIVE
Nitrite: NEGATIVE
Protein, ur: NEGATIVE mg/dL
Specific Gravity, Urine: 1.008 (ref 1.005–1.030)
pH: 5 (ref 5.0–8.0)

## 2018-06-06 LAB — I-STAT TROPONIN, ED
Troponin i, poc: 0.01 ng/mL (ref 0.00–0.08)
Troponin i, poc: 0.01 ng/mL (ref 0.00–0.08)

## 2018-06-06 MED ORDER — GI COCKTAIL ~~LOC~~
30.0000 mL | Freq: Once | ORAL | Status: AC
  Administered 2018-06-06: 30 mL via ORAL
  Filled 2018-06-06: qty 30

## 2018-06-06 MED ORDER — DICYCLOMINE HCL 10 MG/ML IM SOLN
20.0000 mg | Freq: Once | INTRAMUSCULAR | Status: AC
  Administered 2018-06-06: 20 mg via INTRAMUSCULAR
  Filled 2018-06-06: qty 2

## 2018-06-06 MED ORDER — IOPAMIDOL (ISOVUE-370) INJECTION 76%
INTRAVENOUS | Status: AC
  Filled 2018-06-06: qty 100

## 2018-06-06 MED ORDER — IOPAMIDOL (ISOVUE-370) INJECTION 76%
100.0000 mL | Freq: Once | INTRAVENOUS | Status: AC | PRN
  Administered 2018-06-06: 100 mL via INTRAVENOUS

## 2018-06-06 MED ORDER — DICYCLOMINE HCL 20 MG PO TABS: 20.0000 mg | ORAL_TABLET | Freq: Two times a day (BID) | ORAL | 0 refills | Status: DC

## 2018-06-06 NOTE — ED Provider Notes (Signed)
[facility] DEPT Provider Note   CSN: [id number] Arrival date & time: 06/05/18  2302     History   Chief Complaint Chief Complaint  Patient presents with  . Abdominal Pain  . Chest Pain    HPI [name] is a 57 y.o. male.  The history is provided by the patient.  Abdominal Pain   This is a recurrent problem. The current episode started more than 2 days ago. The problem occurs constantly. The problem has not changed since onset.The pain is associated with an unknown factor. The pain is located in the suprapubic region. The quality of the pain is cramping. The pain is at a severity of 5/10. The pain is moderate. Associated symptoms include diarrhea. Pertinent negatives include anorexia, fever, belching, flatus, hematochezia, melena, nausea, vomiting, constipation, dysuria, frequency, hematuria, headaches, arthralgias and myalgias. Nothing aggravates the symptoms. Nothing relieves the symptoms. Past workup includes CT scan. His past medical history is significant for irritable bowel syndrome.  Chest Pain   This is a recurrent problem. The current episode started more than 2 days ago. The problem occurs rarely. The problem has been resolved. The pain is associated with rest. The pain is present in the lateral region. The pain is moderate. The quality of the pain is described as sharp and pleuritic. The pain does not radiate. The symptoms are aggravated by deep breathing. Associated symptoms include abdominal pain. Pertinent negatives include no diaphoresis, no fever, no headaches, no nausea, no palpitations, no shortness of breath and no vomiting. He has tried nothing for the symptoms. The treatment provided no relief. Risk factors include male gender.  Pertinent negatives for past medical history include no aneurysm.  Pertinent negatives for family medical history include: no Marfan's syndrome.  Procedure history is negative for EPS study.  No DOE, no  n/v/d.  No f/c/r. No leg pain or swelling.  No weakness or numbness.    Past Medical History:  Diagnosis Date  . Anemia   . Arthritis   . Back pain   . CAD (coronary artery disease)    a. s/p DES to LAD in 05/2016  . Cervical radiculopathy   . Chronic diastolic CHF (congestive heart failure) ([place])   . Chronic pain   . Depression   . DVT (deep venous thrombosis) ([place])   . Hematemesis   . Hepatic steatosis   . Hyperlipidemia   . Hypertension   . IBS (irritable bowel syndrome)   . Morbid obesity ([place])   . OSA (obstructive sleep apnea)   . Pancreatitis   . PE (pulmonary thromboembolism) ([place])   . Peripheral neuropathy   . PUD (peptic ulcer disease)   . Renal disorder   . Stroke [facility])    a. ?details unclear - not seen on imaging when he was admitted in 05/2017 for TIA symptoms which were felt due to cervical radiculopathy.  . Thoracic aortic ectasia (HCC)    a. 4.3cm ectatic ascending thoracic aorta by CT 06/2017.   . Type 2 diabetes mellitus [facility])     Patient Active Problem List   Diagnosis Date Noted  . B12 deficiency 08/10/2017  . Persistent headaches 08/09/2017  . GERD (gastroesophageal reflux disease) 06/29/2017  . Left arm numbness   . Cerebral embolism with cerebral infarction 06/17/2017  . TIA (transient ischemic attack) 06/17/2017  . Chronic back pain 12/29/2016  . Depression 12/15/2016  . Vitamin D deficiency 12/09/2016  . Right hip pain 12/07/2016  . Hypokalemia 12/07/2016  .  Type 2 diabetes mellitus with vascular disease ([place]) 05/31/2016  . Normocytic normochromic anemia 05/31/2016  . Chest pain 05/31/2016  . CAD (coronary artery disease) 01/06/2016  . DVT (deep venous thrombosis) ([place]) 01/06/2016  . Lactic acidosis 05/28/2014  . Nonspecific chest pain 01/29/2014  . Uncontrolled secondary diabetes with peripheral neuropathy ([place]) 01/29/2014  . Obesity, Class III, BMI 40-49.9 (morbid obesity) ([place]) 01/29/2014  . Snoring 01/29/2014  . Dyslipidemia 01/29/2014   . HTN (hypertension) 01/29/2014  . Abnormal nuclear stress test 01/29/2014    Past Surgical History:  Procedure Laterality Date  . CARDIAC CATHETERIZATION N/A 05/31/2016   Procedure: Left Heart Cath and Coronary Angiography;  Surgeon: Peter M Martinique, MD;  Location: [facility] CV LAB;  Service: Cardiovascular;  Laterality: N/A;  . CARDIAC CATHETERIZATION N/A 05/31/2016   Procedure: Intravascular Pressure Wire/FFR Study;  Surgeon: Peter M Martinique, MD;  Location: [facility] CV LAB;  Service: Cardiovascular;  Laterality: N/A;  . CARDIAC CATHETERIZATION N/A 05/31/2016   Procedure: Coronary Stent Intervention;  Surgeon: Peter M Martinique, MD;  Location: [facility] CV LAB;  Service: Cardiovascular;  Laterality: N/A;  . LEFT HEART CATH AND CORONARY ANGIOGRAPHY N/A 12/08/2017   Procedure: LEFT HEART CATH AND CORONARY ANGIOGRAPHY;  Surgeon: Leonie Man, MD;  Location: [facility] CV LAB;  Service: Cardiovascular;  Laterality: N/A;  . LEFT HEART CATHETERIZATION WITH CORONARY ANGIOGRAM N/A 02/03/2014   Procedure: LEFT HEART CATHETERIZATION WITH CORONARY ANGIOGRAM;  Surgeon: Pixie Casino, MD;  Location: [facility] CATH LAB;  Service: Cardiovascular;  Laterality: N/A;  . left leg stent           Home Medications    Prior to Admission medications   Medication Sig Start Date End Date Taking? Authorizing Provider  aspirin 81 MG chewable tablet Chew 1 tablet (81 mg total) by mouth daily. 06/01/16  Yes Elwin Mocha, MD  atorvastatin (LIPITOR) 80 MG tablet Take 1 tablet (80 mg total) by mouth daily. 01/26/17  Yes Strader, [place] M, PA-C  diclofenac (FLECTOR) 1.3 % PTCH APPLY 1 PATCH ONCE DAILY AS NEEDED FOR 30 DAYS 04/03/18  Yes [provider]  DULoxetine (CYMBALTA) 60 MG capsule Take 1 capsule (60 mg total) by mouth daily. 03/08/18  Yes Marcial Pacas, MD  famotidine (PEPCID) 40 MG tablet Take 1 tablet (40 mg total) by mouth at bedtime. 06/18/17  Yes Barton Dubois, MD  furosemide (LASIX) 20 MG tablet  Take 60 mg by mouth daily.   Yes [provider]  glipiZIDE (GLUCOTROL) 10 MG tablet Take 10 mg by mouth 2 (two) times daily. 05/30/17  Yes [provider]  hydrALAZINE (APRESOLINE) 25 MG tablet Take 3 tablets (75 mg total) by mouth 3 (three) times daily. 01/30/18  Yes Barrett, Evelene Croon, PA-C  Insulin Detemir (LEVEMIR) 100 UNIT/ML Pen Inject 20 Units into the skin daily at 10 pm. Patient taking differently: Inject 55 Units into the skin daily at 10 pm.  06/18/17  Yes Barton Dubois, MD  isosorbide dinitrate (ISORDIL) 30 MG tablet Take 1 tablet (30 mg total) by mouth every morning. 12/10/17  Yes Velvet Bathe, MD  lisinopril (PRINIVIL,ZESTRIL) 10 MG tablet Take 1 tablet (10 mg total) by mouth daily. 12/10/17  Yes Velvet Bathe, MD  LYRICA 100 MG capsule Take 100 mg by mouth 2 (two) times daily. 11/29/17  Yes [provider]  metFORMIN (GLUCOPHAGE-XR) 500 MG 24 hr tablet Take 2 tablets (1,000 mg total) by mouth 2 (two) times daily. 07/02/17  Yes Barton Dubois,  MD  nitroGLYCERIN (NITROSTAT) 0.4 MG SL tablet Place 0.4 mg under the tongue every 5 (five) minutes as needed for chest pain. 12/17/16  Yes [provider]  oxyCODONE (ROXICODONE) 15 MG immediate release tablet Take 15 mg 3 (three) times daily as needed by mouth for pain.  08/28/17  Yes [provider]  potassium chloride SA (K-DUR,KLOR-CON) 20 MEQ tablet Take 40 mEq by mouth daily.    Yes [provider]  sertraline (ZOLOFT) 50 MG tablet Take 50 mg by mouth daily.   Yes [provider]  sitaGLIPtin (JANUVIA) 100 MG tablet Take 100 mg by mouth daily.   Yes [provider]  tiZANidine (ZANAFLEX) 2 MG tablet Take 2 mg by mouth 2 (two) times daily as needed for pain.   Yes [provider]  traZODone (DESYREL) 50 MG tablet Take 1 tablet (50 mg total) by mouth at bedtime. 12/15/16  Yes Burns, Arloa Koh, MD  carvedilol (COREG) 25 MG tablet Take 1 tablet (25 mg total) by mouth 2 (two)  times daily with a meal. Patient not taking: Reported on 06/06/2018 01/26/17   Erma Heritage, PA-C  diclofenac sodium (VOLTAREN) 1 % GEL Apply 1 application topically 4 (four) times daily. Patient not taking: Reported on 06/06/2018 04/05/18   Melynda Ripple, MD  furosemide (LASIX) 40 MG tablet Take 1 tablet (40 mg total) by mouth every evening. Patient not taking: Reported on 06/06/2018 12/09/17   Velvet Bathe, MD  furosemide (LASIX) 80 MG tablet Take 1 tablet (80 mg total) by mouth daily. Patient not taking: Reported on 06/06/2018 12/10/17   Velvet Bathe, MD  hydrALAZINE (APRESOLINE) 50 MG tablet TAKE 1 TABLET (50 MG TOTAL) BY MOUTH 3 (THREE) TIMES DAILY. Patient not taking: Reported on 05/15/2018 03/27/18   Lelon Perla, MD  Insulin Pen Needle 31G X 5 MM MISC Use 1 needle daily to inject insulin as prescribed 06/18/17   Barton Dubois, MD  oxyCODONE-acetaminophen (PERCOCET/ROXICET) 5-325 MG tablet Take 1-2 tablets by mouth every 6 (six) hours as needed for moderate pain or severe pain. Patient not taking: Reported on 06/06/2018 05/17/18   Duffy Bruce, MD  pantoprazole (PROTONIX) 40 MG tablet Take 1 tablet (40 mg total) by mouth 2 (two) times daily. Patient not taking: Reported on 05/15/2018 06/18/17   Barton Dubois, MD    Family History Family History  Problem Relation Age of Onset  . Cancer Father   . Hypertension Mother   . Diabetes Mother   . Breast cancer Mother   . Hypertension Brother   . Diabetes Brother   . Hypertension Sister   . Diabetes Sister     Social History Social History   Tobacco Use  . Smoking status: Never Smoker  . Smokeless tobacco: Never Used  Substance Use Topics  . Alcohol use: No  . Drug use: No     Allergies   Coconut flavor [flavoring agent]; Coconut oil; Ibuprofen; Aleve [naproxen]; and Nsaids   Review of Systems Review of Systems  Constitutional: Negative for diaphoresis and fever.  Respiratory: Negative for chest tightness and  shortness of breath.   Cardiovascular: Positive for chest pain. Negative for palpitations and leg swelling.  Gastrointestinal: Positive for abdominal pain and diarrhea. Negative for anorexia, constipation, flatus, hematochezia, melena, nausea and vomiting.  Genitourinary: Negative for dysuria, frequency and hematuria.  Musculoskeletal: Negative for arthralgias, myalgias and neck pain.  Neurological: Negative for headaches.  All other systems reviewed and are negative.    Physical Exam  Updated Vital Signs BP 124/77 (BP Location: Right Arm)   Pulse 94   Temp 98.1 F (36.7 C) (Oral)   Resp 19   Ht 5\' 10"  (1.778 m)   Wt (!) 138.8 kg   SpO2 91%   BMI 43.91 kg/m   Physical Exam  Constitutional: He is oriented to person, place, and time. He appears well-developed and well-nourished. No distress.  HENT:  Head: Normocephalic and atraumatic.  Mouth/Throat: No oropharyngeal exudate.  Eyes: Pupils are equal, round, and reactive to light. Conjunctivae are normal.  Neck: Normal range of motion. Neck supple. No JVD present. No tracheal deviation present.  Cardiovascular: Normal rate, regular rhythm, normal heart sounds and intact distal pulses.  Pulmonary/Chest: Effort normal and breath sounds normal. No stridor. No respiratory distress. He has no wheezes. He has no rales.  Abdominal: Soft. Bowel sounds are normal. He exhibits no mass. There is no tenderness. There is no rebound and no guarding.  Musculoskeletal: Normal range of motion. He exhibits no edema or tenderness.  Neurological: He is alert and oriented to person, place, and time. He displays normal reflexes.  Skin: Skin is warm and dry. Capillary refill takes less than 2 seconds.  Psychiatric: He has a normal mood and affect.     ED Treatments / Results  Labs (all labs ordered are listed, but only abnormal results are displayed) Results for orders placed or performed during the hospital encounter of 06/05/18  CBC with  Differential  Result Value Ref Range   WBC 11.6 (H) 4.0 - 10.5 K/uL   RBC 4.26 4.22 - 5.81 MIL/uL   Hemoglobin 12.6 (L) 13.0 - 17.0 g/dL   HCT 38.9 (L) 39.0 - 52.0 %   MCV 91.3 78.0 - 100.0 fL   MCH 29.6 26.0 - 34.0 pg   MCHC 32.4 30.0 - 36.0 g/dL   RDW 14.2 11.5 - 15.5 %   Platelets 288 150 - 400 K/uL   Neutrophils Relative % 73 %   Neutro Abs 8.5 (H) 1.7 - 7.7 K/uL   Lymphocytes Relative 18 %   Lymphs Abs 2.1 0.7 - 4.0 K/uL   Monocytes Relative 8 %   Monocytes Absolute 0.9 0.1 - 1.0 K/uL   Eosinophils Relative 1 %   Eosinophils Absolute 0.1 0.0 - 0.7 K/uL   Basophils Relative 0 %   Basophils Absolute 0.0 0.0 - 0.1 K/uL  Urinalysis, Routine w reflex microscopic  Result Value Ref Range   Color, Urine STRAW (A) YELLOW   APPearance CLEAR CLEAR   Specific Gravity, Urine 1.008 1.005 - 1.030   pH 5.0 5.0 - 8.0   Glucose, UA 150 (A) NEGATIVE mg/dL   Hgb urine dipstick NEGATIVE NEGATIVE   Bilirubin Urine NEGATIVE NEGATIVE   Ketones, ur NEGATIVE NEGATIVE mg/dL   Protein, ur NEGATIVE NEGATIVE mg/dL   Nitrite NEGATIVE NEGATIVE   Leukocytes, UA NEGATIVE NEGATIVE  I-Stat Troponin, ED (not at [facility])  Result Value Ref Range   Troponin i, poc 0.01 0.00 - 0.08 ng/mL   Comment 3          I-Stat Chem 8, ED  Result Value Ref Range   Sodium 139 135 - 145 mmol/L   Potassium 3.7 3.5 - 5.1 mmol/L   Chloride 97 (L) 98 - 111 mmol/L   BUN 15 6 - 20 mg/dL   Creatinine, Ser 0.80 0.61 - 1.24 mg/dL   Glucose, Bld 232 (H) 70 - 99 mg/dL   Calcium, Ion 1.03 (L) 1.15 - 1.40  mmol/L   TCO2 27 22 - 32 mmol/L   Hemoglobin 13.6 13.0 - 17.0 g/dL   HCT 40.0 39.0 - 52.0 %  I-stat troponin, ED  Result Value Ref Range   Troponin i, poc 0.01 0.00 - 0.08 ng/mL   Comment 3           Ct Angio Chest Pe W And/or Wo Contrast  Result Date: 06/06/2018 CLINICAL DATA:  Chest pain and heaviness. History of hypertension and diabetes. Previous history of pulmonary embolus and deep venous thrombosis. EXAM: CT  ANGIOGRAPHY CHEST WITH CONTRAST TECHNIQUE: Multidetector CT imaging of the chest was performed using the standard protocol during bolus administration of intravenous contrast. Multiplanar CT image reconstructions and MIPs were obtained to evaluate the vascular anatomy. CONTRAST:  12mL ISOVUE-370 IOPAMIDOL (ISOVUE-370) INJECTION 76% COMPARISON:  06/30/2017 FINDINGS: Cardiovascular: There is good opacification of the central and segmental pulmonary arteries. No focal filling defects. No evidence of significant pulmonary embolus. Normal caliber thoracic aorta. Normal heart size. No pericardial effusion. Coronary artery calcifications. Mediastinum/Nodes: No significant lymphadenopathy in the chest. Esophagus is decompressed. Lungs/Pleura: Motion artifact limits examination. No evidence of focal consolidation. No pleural effusions. No pneumothorax. Airways are patent. Upper Abdomen: Diffuse fatty infiltration of the liver. Musculoskeletal: Degenerative changes throughout the spine. No destructive bone lesions. Review of the MIP images confirms the above findings. IMPRESSION: No evidence of significant pulmonary embolus. No evidence of active pulmonary disease. Electronically Signed   By: Lucienne Capers M.D.   On: 06/06/2018 02:57   Dg Toe Great Left  Result Date: 05/17/2018 CLINICAL DATA:  Great toe pain. EXAM: LEFT GREAT TOE COMPARISON:  Left foot radiographs 10/12/2015 FINDINGS: No acute fracture, dislocation, or osseous erosion is seen. Mild-to-moderate marginal spurring at the first MTP joint is unchanged. There is also mild great toe IP joint spurring. No focal soft tissue abnormality is seen. IMPRESSION: Degenerative changes without acute osseous abnormality. Electronically Signed   By: Logan Bores M.D.   On: 05/17/2018 12:56    EKG EKG Interpretation  Date/Time:  Tuesday June 05 2018 23:59:36 EDT Ventricular Rate:  100 PR Interval:    QRS Duration: 76 QT Interval:  385 QTC Calculation: 497 R  Axis:   32 Text Interpretation:  Sinus tachycardia Confirmed by Dory Horn) on 06/06/2018 1:18:21 AM   Radiology Ct Angio Chest Pe W And/or Wo Contrast  Result Date: 06/06/2018 CLINICAL DATA:  Chest pain and heaviness. History of hypertension and diabetes. Previous history of pulmonary embolus and deep venous thrombosis. EXAM: CT ANGIOGRAPHY CHEST WITH CONTRAST TECHNIQUE: Multidetector CT imaging of the chest was performed using the standard protocol during bolus administration of intravenous contrast. Multiplanar CT image reconstructions and MIPs were obtained to evaluate the vascular anatomy. CONTRAST:  166mL ISOVUE-370 IOPAMIDOL (ISOVUE-370) INJECTION 76% COMPARISON:  06/30/2017 FINDINGS: Cardiovascular: There is good opacification of the central and segmental pulmonary arteries. No focal filling defects. No evidence of significant pulmonary embolus. Normal caliber thoracic aorta. Normal heart size. No pericardial effusion. Coronary artery calcifications. Mediastinum/Nodes: No significant lymphadenopathy in the chest. Esophagus is decompressed. Lungs/Pleura: Motion artifact limits examination. No evidence of focal consolidation. No pleural effusions. No pneumothorax. Airways are patent. Upper Abdomen: Diffuse fatty infiltration of the liver. Musculoskeletal: Degenerative changes throughout the spine. No destructive bone lesions. Review of the MIP images confirms the above findings. IMPRESSION: No evidence of significant pulmonary embolus. No evidence of active pulmonary disease. Electronically Signed   By: Lucienne Capers M.D.   On: 06/06/2018 02:57  Procedures Procedures (including critical care time)  Medications Ordered in ED Medications  gi cocktail (Maalox,Lidocaine,Donnatal) (30 mLs Oral Given 06/06/18 0137)  dicyclomine (BENTYL) injection 20 mg (20 mg Intramuscular Given 06/06/18 0137)  iopamidol (ISOVUE-370) 76 % injection 100 mL (100 mLs Intravenous Contrast Given 06/06/18  0233)      Final Clinical Impressions(s) / ED Diagnoses   Ruled out for MI and PE in the ED.  Symptoms lasting only seconds is inconsistent with cardiac pain.  His abdominal pain is consistent with his IBS.  Will start bentyl and have patient follow up with his PMD.  Return for numbness, changes in vision or speech, fevers >100.4 unrelieved by medication, shortness of breath, intractable vomiting, or diarrhea, abdominal pain, Inability to tolerate liquids or food, cough, altered mental status or any concerns. No signs of systemic illness or infection. The patient is nontoxic-appearing on exam and vital signs are within normal limits. Will refer to urology for microscopy hematuria as patient is asymptomatic.  I have reviewed the triage vital signs and the nursing notes. Pertinent labs &imaging results that were available during my care of the patient were reviewed by me and considered in my medical decision making (see chart for details).  After history, exam, and medical workup I feel the patient has been appropriately medically screened and is safe for discharge home. Pertinent diagnoses were discussed with the patient. Patient was given return precautions.      Karrisa Didio, MD 06/06/18 1884

## 2018-06-10 ENCOUNTER — Emergency Department (HOSPITAL_COMMUNITY): Payer: Medicaid Other

## 2018-06-10 ENCOUNTER — Emergency Department (HOSPITAL_COMMUNITY)
Admission: EM | Admit: 2018-06-10 | Discharge: 2018-06-10 | Disposition: A | Discharge status: Home / Self Care | Payer: Medicaid Other | Attending: Emergency Medicine | Admitting: Emergency Medicine

## 2018-06-10 ENCOUNTER — Encounter (HOSPITAL_COMMUNITY): Payer: Self-pay | Admitting: Emergency Medicine

## 2018-06-10 DIAGNOSIS — R0602 Shortness of breath: Secondary | ICD-10-CM | POA: Insufficient documentation

## 2018-06-10 DIAGNOSIS — Z79899 Other long term (current) drug therapy: Secondary | ICD-10-CM | POA: Diagnosis not present

## 2018-06-10 DIAGNOSIS — R202 Paresthesia of skin: Secondary | ICD-10-CM | POA: Insufficient documentation

## 2018-06-10 DIAGNOSIS — Z8673 Personal history of transient ischemic attack (TIA), and cerebral infarction without residual deficits: Secondary | ICD-10-CM | POA: Insufficient documentation

## 2018-06-10 DIAGNOSIS — R200 Anesthesia of skin: Secondary | ICD-10-CM

## 2018-06-10 DIAGNOSIS — L03032 Cellulitis of left toe: Secondary | ICD-10-CM | POA: Diagnosis not present

## 2018-06-10 DIAGNOSIS — I110 Hypertensive heart disease with heart failure: Secondary | ICD-10-CM | POA: Insufficient documentation

## 2018-06-10 DIAGNOSIS — R531 Weakness: Secondary | ICD-10-CM

## 2018-06-10 DIAGNOSIS — M542 Cervicalgia: Secondary | ICD-10-CM | POA: Diagnosis not present

## 2018-06-10 DIAGNOSIS — R079 Chest pain, unspecified: Secondary | ICD-10-CM | POA: Diagnosis not present

## 2018-06-10 DIAGNOSIS — E1140 Type 2 diabetes mellitus with diabetic neuropathy, unspecified: Secondary | ICD-10-CM | POA: Diagnosis not present

## 2018-06-10 DIAGNOSIS — I5032 Chronic diastolic (congestive) heart failure: Secondary | ICD-10-CM | POA: Insufficient documentation

## 2018-06-10 DIAGNOSIS — Z794 Long term (current) use of insulin: Secondary | ICD-10-CM | POA: Diagnosis not present

## 2018-06-10 DIAGNOSIS — I77810 Thoracic aortic ectasia: Secondary | ICD-10-CM | POA: Insufficient documentation

## 2018-06-10 LAB — CBC
HCT: 38.7 % — ABNORMAL LOW (ref 39.0–52.0)
Hemoglobin: 12.2 g/dL — ABNORMAL LOW (ref 13.0–17.0)
MCH: 29.2 pg (ref 26.0–34.0)
MCHC: 31.5 g/dL (ref 30.0–36.0)
MCV: 92.6 fL (ref 78.0–100.0)
Platelets: 261 10*3/uL (ref 150–400)
RBC: 4.18 MIL/uL — ABNORMAL LOW (ref 4.22–5.81)
RDW: 14.1 % (ref 11.5–15.5)
WBC: 10.2 10*3/uL (ref 4.0–10.5)

## 2018-06-10 LAB — COMPREHENSIVE METABOLIC PANEL
ALT: 33 U/L (ref 0–44)
AST: 31 U/L (ref 15–41)
Albumin: 3.6 g/dL (ref 3.5–5.0)
Alkaline Phosphatase: 47 U/L (ref 38–126)
Anion gap: 15 (ref 5–15)
BUN: 10 mg/dL (ref 6–20)
CO2: 25 mmol/L (ref 22–32)
Calcium: 8.6 mg/dL — ABNORMAL LOW (ref 8.9–10.3)
Chloride: 98 mmol/L (ref 98–111)
Creatinine, Ser: 0.82 mg/dL (ref 0.61–1.24)
GFR calc Af Amer: >60 mL/min (ref >60)
GFR calc non Af Amer: >60 mL/min (ref >60)
Glucose, Bld: 251 mg/dL — ABNORMAL HIGH (ref 70–99)
Potassium: 3.6 mmol/L (ref 3.5–5.1)
Sodium: 138 mmol/L (ref 135–145)
Total Bilirubin: 0.7 mg/dL (ref 0.3–1.2)
Total Protein: 6.9 g/dL (ref 6.5–8.1)

## 2018-06-10 LAB — DIFFERENTIAL
Abs Immature Granulocytes: 0 10*3/uL (ref 0.0–0.1)
Basophils Absolute: 0.1 10*3/uL (ref 0.0–0.1)
Basophils Relative: 1 %
Eosinophils Absolute: 0.1 10*3/uL (ref 0.0–0.7)
Eosinophils Relative: 1 %
Immature Granulocytes: 0 %
Lymphocytes Relative: 26 %
Lymphs Abs: 2.6 10*3/uL (ref 0.7–4.0)
Monocytes Absolute: 0.7 10*3/uL (ref 0.1–1.0)
Monocytes Relative: 7 %
Neutro Abs: 6.6 10*3/uL (ref 1.7–7.7)
Neutrophils Relative %: 65 %

## 2018-06-10 LAB — I-STAT TROPONIN, ED
Troponin i, poc: 0.01 ng/mL (ref 0.00–0.08)
Troponin i, poc: 0.01 ng/mL (ref 0.00–0.08)

## 2018-06-10 LAB — I-STAT CHEM 8, ED
BUN: 12 mg/dL (ref 6–20)
Calcium, Ion: 0.99 mmol/L — ABNORMAL LOW (ref 1.15–1.40)
Chloride: 98 mmol/L (ref 98–111)
Creatinine, Ser: 0.6 mg/dL — ABNORMAL LOW (ref 0.61–1.24)
Glucose, Bld: 259 mg/dL — ABNORMAL HIGH (ref 70–99)
HCT: 39 % (ref 39.0–52.0)
Hemoglobin: 13.3 g/dL (ref 13.0–17.0)
Potassium: 3.7 mmol/L (ref 3.5–5.1)
Sodium: 137 mmol/L (ref 135–145)
TCO2: 26 mmol/L (ref 22–32)

## 2018-06-10 LAB — PROTIME-INR
INR: 0.92
Prothrombin Time: 12.3 seconds (ref 11.4–15.2)

## 2018-06-10 LAB — APTT: aPTT: 28 seconds (ref 24–36)

## 2018-06-10 MED ORDER — SODIUM CHLORIDE 0.9 % IV BOLUS
1000.0000 mL | Freq: Once | INTRAVENOUS | Status: AC
  Administered 2018-06-10: 1000 mL via INTRAVENOUS

## 2018-06-10 MED ORDER — ACETAMINOPHEN 325 MG PO TABS
650.0000 mg | ORAL_TABLET | Freq: Once | ORAL | Status: AC
Start: 2018-06-10 — End: 2018-06-10
  Administered 2018-06-10: 650 mg via ORAL
  Filled 2018-06-10: qty 2

## 2018-06-10 MED ORDER — IOPAMIDOL (ISOVUE-370) INJECTION 76%
50.0000 mL | Freq: Once | INTRAVENOUS | Status: AC | PRN
Start: 2018-06-10 — End: 2018-06-10
  Administered 2018-06-10: 50 mL via INTRAVENOUS

## 2018-06-10 MED ORDER — CLINDAMYCIN HCL 150 MG PO CAPS: 450.0000 mg | ORAL_CAPSULE | Freq: Three times a day (TID) | ORAL | 0 refills | Status: AC

## 2018-06-10 NOTE — Discharge Instructions (Signed)
Please take the new antibiotic for the next week for your toe.  Please follow-up with your podiatrist and PCP for further management of this.  Please follow-up with neurology for the neurologic symptoms you had.  We did not see evidence of stroke on the imaging today.  If any symptoms change or worsen, please return to the nearest emergency department.

## 2018-06-10 NOTE — ED Notes (Signed)
Pt states that he is having trouble breathing. Phlebotomy at bedside. RN aware.

## 2018-06-10 NOTE — ED Notes (Signed)
Pt ambulated without difficulty to wheelchair.

## 2018-06-10 NOTE — ED Provider Notes (Signed)
4:25 PM Care assumed from Dr. Melina Copa.  At time of transfer care, patient is awaiting MRI to rule out stroke.  If patient's MRI is negative, plan is to discharge patient with outpatient neurology and PCP follow-up.  Delta troponin was negative and previous team thought patient was cleared from a cardiac standpoint.  Next  Anticipate discharge if MRI is negative.  6:51 PM MRI was negative.  Neurology confirmed plan to discharge after MRI was negative.  Patient will follow-up with outpatient neurologist for further evaluation and management.  As patient is being discharged she requested evaluation of his left great toe.  He says it has been infected.  He reports that he is complete other antibiotics but continues to have some purulence and foul smell.  On my exam, he had no tenderness but there was a small amount of purulence.  Patient was switched to clindamycin and follow-up with his PCP and podiatrist.  Patient voiced understanding plan of care as well as return precautions.  Patient had no other questions or concerns and was discharged in good condition.     Clinical Impression: 1. Neck pain   2. Chest pain, unspecified type   3. Left sided numbness   4. Cellulitis of great toe of left foot     Disposition: Discharge  Condition: Good  I have discussed the results, Dx and Tx plan with the pt(& family if present). He/she/they expressed understanding and agree(s) with the plan. Discharge instructions discussed at great length. Strict return precautions discussed and pt &/or family have verbalized understanding of the instructions. No further questions at time of discharge.    New Prescriptions   CLINDAMYCIN (CLEOCIN) 150 MG CAPSULE    Take 3 capsules (450 mg total) by mouth 3 (three) times daily for 7 days.    Follow Up: [facility] [address]     [address] [place] [address] [place], [place], [place] [address] [place] [address] [id number] mc [place] [phone]       Bobbette Eakes, Gwenyth Allegra, MD 06/10/18 [phone]

## 2018-06-10 NOTE — ED Triage Notes (Addendum)
Pt arrives c.o. Left sided neck pain that started in church at 11am. Pt states he also was having difficulty breathing, but denied chest pain when asked.  Pt has numbness to the left side of his body as well, to his cheek, left arm and left leg. Significant hx, hx of blood clots. Pt case discussed with MD Melina Copa who requested we call a code stroke on the pt, LSN 11am.

## 2018-06-10 NOTE — ED Notes (Signed)
Hot pack place to L shoulder.

## 2018-06-10 NOTE — ED Notes (Signed)
Pt requesting meds for L sided headache 7/10.  MD notified.

## 2018-06-10 NOTE — ED Provider Notes (Signed)
[facility] EMERGENCY DEPARTMENT Provider Note   CSN: [id number] Arrival date & time: 06/10/18  1245   An emergency department physician performed an initial assessment on this suspected stroke patient at 1352.  History   Chief Complaint Chief Complaint  Patient presents with  . Neck Pain  . Left sided numbness  . Shortness of Breath  . Code Stroke    HPI [name] is a 57 y.o. male.  He has multiple medical problems including coronary disease CHF stroke IBS DVT.  He was just at [facility] a few days ago.  He presents today complaining of acute onset of left-sided neck pain around 11 or 12 today with associated dizziness and left face left arm left leg numbness.  He has had face arm and leg numbness before.  He presented to triage with these complaints and also said he had a little bit of chest pain and shortness of breath.  He felt it was worse when he was lying down the CAT scan table but prior to the injection of the contrast.  He has had these symptoms of chest pain or shortness of breath in the past.  He states his had strokes in the past and his residual symptoms are to do with balance and sometimes forgetfulness.  The history is provided by the patient.  Cerebrovascular Accident  This is a recurrent problem. The current episode started 3 to 5 hours ago. The problem occurs constantly. The problem has not changed since onset.Associated symptoms include chest pain and shortness of breath. Pertinent negatives include no abdominal pain and no headaches. Nothing aggravates the symptoms. Nothing relieves the symptoms. He has tried nothing for the symptoms. The treatment provided no relief.  Chest Pain   This is a recurrent problem. The current episode started 1 to 2 hours ago. The problem occurs constantly. The problem has been gradually improving. The pain is present in the substernal region. The pain is moderate. The quality of the pain is described as  pressure-like. The pain radiates to the left jaw, left neck and left shoulder. Associated symptoms include dizziness, numbness and shortness of breath. Pertinent negatives include no abdominal pain, no cough, no diaphoresis, no fever, no headaches, no hemoptysis, no leg pain, no nausea, no palpitations, no syncope and no vomiting. He has tried nothing for the symptoms. The treatment provided no relief. Risk factors include male gender.    Past Medical History:  Diagnosis Date  . Anemia   . Arthritis   . Back pain   . CAD (coronary artery disease)    a. s/p DES to LAD in 05/2016  . Cervical radiculopathy   . Chronic diastolic CHF (congestive heart failure) ([place])   . Chronic pain   . Depression   . DVT (deep venous thrombosis) ([place])   . Hematemesis   . Hepatic steatosis   . Hyperlipidemia   . Hypertension   . IBS (irritable bowel syndrome)   . Morbid obesity ([place])   . OSA (obstructive sleep apnea)   . Pancreatitis   . PE (pulmonary thromboembolism) ([place])   . Peripheral neuropathy   . PUD (peptic ulcer disease)   . Renal disorder   . Stroke [facility])    a. ?details unclear - not seen on imaging when he was admitted in 05/2017 for TIA symptoms which were felt due to cervical radiculopathy.  . Thoracic aortic ectasia (HCC)    a. 4.3cm ectatic ascending thoracic aorta by CT 06/2017.   Marland Kitchen  Type 2 diabetes mellitus [facility])     Patient Active Problem List   Diagnosis Date Noted  . B12 deficiency 08/10/2017  . Persistent headaches 08/09/2017  . GERD (gastroesophageal reflux disease) 06/29/2017  . Left arm numbness   . Cerebral embolism with cerebral infarction 06/17/2017  . TIA (transient ischemic attack) 06/17/2017  . Chronic back pain 12/29/2016  . Depression 12/15/2016  . Vitamin D deficiency 12/09/2016  . Right hip pain 12/07/2016  . Hypokalemia 12/07/2016  . Type 2 diabetes mellitus with vascular disease ([place]) 05/31/2016  . Normocytic normochromic anemia 05/31/2016  . Chest pain  05/31/2016  . CAD (coronary artery disease) 01/06/2016  . DVT (deep venous thrombosis) ([place]) 01/06/2016  . Lactic acidosis 05/28/2014  . Nonspecific chest pain 01/29/2014  . Uncontrolled secondary diabetes with peripheral neuropathy ([place]) 01/29/2014  . Obesity, Class III, BMI 40-49.9 (morbid obesity) ([place]) 01/29/2014  . Snoring 01/29/2014  . Dyslipidemia 01/29/2014  . HTN (hypertension) 01/29/2014  . Abnormal nuclear stress test 01/29/2014    Past Surgical History:  Procedure Laterality Date  . CARDIAC CATHETERIZATION N/A 05/31/2016   Procedure: Left Heart Cath and Coronary Angiography;  Surgeon: Peter M Martinique, MD;  Location: [place] CV LAB;  Service: Cardiovascular;  Laterality: N/A;  . CARDIAC CATHETERIZATION N/A 05/31/2016   Procedure: Intravascular Pressure Wire/FFR Study;  Surgeon: Peter M Martinique, MD;  Location: [place] CV LAB;  Service: Cardiovascular;  Laterality: N/A;  . CARDIAC CATHETERIZATION N/A 05/31/2016   Procedure: Coronary Stent Intervention;  Surgeon: Peter M Martinique, MD;  Location: [place] CV LAB;  Service: Cardiovascular;  Laterality: N/A;  . LEFT HEART CATH AND CORONARY ANGIOGRAPHY N/A 12/08/2017   Procedure: LEFT HEART CATH AND CORONARY ANGIOGRAPHY;  Surgeon: Leonie Man, MD;  Location: [place] CV LAB;  Service: Cardiovascular;  Laterality: N/A;  . LEFT HEART CATHETERIZATION WITH CORONARY ANGIOGRAM N/A 02/03/2014   Procedure: LEFT HEART CATHETERIZATION WITH CORONARY ANGIOGRAM;  Surgeon: Pixie Casino, MD;  Location: [facility] CATH LAB;  Service: Cardiovascular;  Laterality: N/A;  . left leg stent           Home Medications    Prior to Admission medications   Medication Sig Start Date End Date Taking? Authorizing Provider  aspirin 81 MG chewable tablet Chew 1 tablet (81 mg total) by mouth daily. 06/01/16  Yes Elwin Mocha, MD  atorvastatin (LIPITOR) 80 MG tablet Take 1 tablet (80 mg total) by mouth daily. Patient taking differently: Take 80 mg by  mouth daily at 6 PM.  01/26/17  Yes Strader, [place] M, PA-C  diclofenac (FLECTOR) 1.3 % PTCH Place 1 patch onto the skin as needed.  04/03/18  Yes [provider]  dicyclomine (BENTYL) 20 MG tablet Take 1 tablet (20 mg total) by mouth 2 (two) times daily. 06/06/18  Yes Palumbo, April, MD  DULoxetine (CYMBALTA) 60 MG capsule Take 1 capsule (60 mg total) by mouth daily. 03/08/18  Yes Marcial Pacas, MD  famotidine (PEPCID) 40 MG tablet Take 1 tablet (40 mg total) by mouth at bedtime. Patient taking differently: Take 40 mg by mouth daily.  06/18/17  Yes Barton Dubois, MD  furosemide (LASIX) 40 MG tablet Take 1 tablet (40 mg total) by mouth every evening. Patient taking differently: Take 40 mg by mouth 3 (three) times daily.  12/09/17  Yes Velvet Bathe, MD  glipiZIDE (GLUCOTROL) 10 MG tablet Take 10 mg by mouth 2 (two) times daily. 05/30/17  Yes [provider]  hydrALAZINE (APRESOLINE) 25  MG tablet Take 3 tablets (75 mg total) by mouth 3 (three) times daily. 01/30/18  Yes Barrett, Evelene Croon, PA-C  Insulin Detemir (LEVEMIR) 100 UNIT/ML Pen Inject 20 Units into the skin daily at 10 pm. Patient taking differently: Inject 55 Units into the skin daily at 10 pm.  06/18/17  Yes Barton Dubois, MD  isosorbide dinitrate (ISORDIL) 30 MG tablet Take 1 tablet (30 mg total) by mouth every morning. 12/10/17  Yes Velvet Bathe, MD  lisinopril (PRINIVIL,ZESTRIL) 10 MG tablet Take 1 tablet (10 mg total) by mouth daily. Patient taking differently: Take 40 mg by mouth daily.  12/10/17  Yes Velvet Bathe, MD  LYRICA 100 MG capsule Take 100 mg by mouth 2 (two) times daily. 11/29/17  Yes [provider]  metFORMIN (GLUCOPHAGE-XR) 500 MG 24 hr tablet Take 2 tablets (1,000 mg total) by mouth 2 (two) times daily. 07/02/17  Yes Barton Dubois, MD  carvedilol (COREG) 25 MG tablet Take 1 tablet (25 mg total) by mouth 2 (two) times daily with a meal. Patient not taking: Reported on 06/06/2018 01/26/17   Erma Heritage,  PA-C  diclofenac sodium (VOLTAREN) 1 % GEL Apply 1 application topically 4 (four) times daily. Patient not taking: Reported on 06/06/2018 04/05/18   Melynda Ripple, MD  furosemide (LASIX) 80 MG tablet Take 1 tablet (80 mg total) by mouth daily. Patient not taking: Reported on 06/06/2018 12/10/17   Velvet Bathe, MD  hydrALAZINE (APRESOLINE) 50 MG tablet TAKE 1 TABLET (50 MG TOTAL) BY MOUTH 3 (THREE) TIMES DAILY. Patient not taking: Reported on 06/10/2018 03/27/18   Lelon Perla, MD  Insulin Pen Needle 31G X 5 MM MISC Use 1 needle daily to inject insulin as prescribed 06/18/17   Barton Dubois, MD  nitroGLYCERIN (NITROSTAT) 0.4 MG SL tablet Place 0.4 mg under the tongue every 5 (five) minutes as needed for chest pain. 12/17/16   [provider]  oxyCODONE (ROXICODONE) 15 MG immediate release tablet Take 15 mg 3 (three) times daily as needed by mouth for pain.  08/28/17   [provider]  oxyCODONE-acetaminophen (PERCOCET/ROXICET) 5-325 MG tablet Take 1-2 tablets by mouth every 6 (six) hours as needed for moderate pain or severe pain. Patient not taking: Reported on 06/06/2018 05/17/18   Duffy Bruce, MD  pantoprazole (PROTONIX) 40 MG tablet Take 1 tablet (40 mg total) by mouth 2 (two) times daily. Patient not taking: Reported on 05/15/2018 06/18/17   Barton Dubois, MD  potassium chloride SA (K-DUR,KLOR-CON) 20 MEQ tablet Take 40 mEq by mouth daily.     [provider]  sertraline (ZOLOFT) 50 MG tablet Take 50 mg by mouth daily.    [provider]  sitaGLIPtin (JANUVIA) 100 MG tablet Take 100 mg by mouth daily.    [provider]  tiZANidine (ZANAFLEX) 2 MG tablet Take 2 mg by mouth 2 (two) times daily as needed for pain.    [provider]  traZODone (DESYREL) 50 MG tablet Take 1 tablet (50 mg total) by mouth at bedtime. 12/15/16   Florinda Marker, MD    Family History Family History  Problem Relation Age of Onset  . Cancer Father   .  Hypertension Mother   . Diabetes Mother   . Breast cancer Mother   . Hypertension Brother   . Diabetes Brother   . Hypertension Sister   . Diabetes Sister     Social History Social History   Tobacco Use  . Smoking status: Never Smoker  .  Smokeless tobacco: Never Used  Substance Use Topics  . Alcohol use: No  . Drug use: No     Allergies   Coconut flavor [flavoring agent]; Coconut oil; Ibuprofen; Aleve [naproxen]; and Nsaids   Review of Systems Review of Systems  Constitutional: Negative for diaphoresis and fever.  HENT: Negative for hearing loss and sore throat.   Eyes: Negative for pain and visual disturbance.  Respiratory: Positive for chest tightness and shortness of breath. Negative for cough and hemoptysis.   Cardiovascular: Positive for chest pain. Negative for palpitations and syncope.  Gastrointestinal: Positive for diarrhea. Negative for abdominal pain, nausea and vomiting.  Genitourinary: Negative for frequency and hematuria.  Musculoskeletal: Positive for neck pain.  Skin: Negative for rash.  Neurological: Positive for dizziness and numbness. Negative for headaches.     Physical Exam Updated Vital Signs BP 108/61   Pulse 99   Temp 99.1 F (37.3 C) (Oral)   Resp 20   Ht 5\' 10"  (1.778 m)   Wt 131.5 kg   SpO2 97%   BMI 41.61 kg/m   Physical Exam  Constitutional: He is oriented to person, place, and time. He appears well-developed and well-nourished.  HENT:  Head: Normocephalic and atraumatic.  Eyes: Conjunctivae are normal.  Neck: Neck supple.  Cardiovascular: Normal rate and regular rhythm.  No murmur heard. Pulmonary/Chest: Effort normal and breath sounds normal. No respiratory distress.  Abdominal: Soft. There is no tenderness.  Musculoskeletal: He exhibits no edema.       Right lower leg: Normal. He exhibits no tenderness.       Left lower leg: Normal. He exhibits no tenderness.  Neurological: He is alert and oriented to person, place, and  time. GCS eye subscore is 4. GCS verbal subscore is 5. GCS motor subscore is 6.  Patient has subjective decreased sensation left face left arm left leg.  Strength intact.  Skin: Skin is warm and dry. Capillary refill takes less than 2 seconds.  Psychiatric: He has a normal mood and affect.  Nursing note and vitals reviewed.    ED Treatments / Results  Labs (all labs ordered are listed, but only abnormal results are displayed) Labs Reviewed  CBC - Abnormal; Notable for the following components:      Result Value   RBC 4.18 (*)    Hemoglobin 12.2 (*)    HCT 38.7 (*)    All other components within normal limits  I-STAT CHEM 8, ED - Abnormal; Notable for the following components:   Creatinine, Ser 0.60 (*)    Glucose, Bld 259 (*)    Calcium, Ion 0.99 (*)    All other components within normal limits  DIFFERENTIAL  PROTIME-INR  APTT  COMPREHENSIVE METABOLIC PANEL  I-STAT TROPONIN, ED  CBG MONITORING, ED    EKG EKG Interpretation  Date/Time:  Sunday June 10 2018 13:25:42 EDT Ventricular Rate:  104 PR Interval:  154 QRS Duration: 74 QT Interval:  382 QTC Calculation: 502 R Axis:   88 Text Interpretation:  Sinus tachycardia Cannot rule out Anterior infarct , age undetermined Abnormal ECG nonspecific t wave inversions compared with 8/19 Confirmed by Aletta Edouard [phone]) on 06/10/2018 2:06:45 PM   Radiology Ct Head Code Stroke Wo Contrast  Result Date: 06/10/2018 CLINICAL DATA:  Code stroke. 57 year old male with left side numbness. Last seen normal 1100 hours. EXAM: CT HEAD WITHOUT CONTRAST TECHNIQUE: Contiguous axial images were obtained from the base of the skull through the vertex without intravenous contrast. COMPARISON:  Brain  MRI and head CT 06/17/2017. FINDINGS: Brain: Cerebral volume remains normal. No midline shift, ventriculomegaly, mass effect, evidence of mass lesion, intracranial hemorrhage or evidence of cortically based acute infarction. Gray-white matter  differentiation is within normal limits throughout the brain. Vascular: Calcified atherosclerosis at the skull base. No suspicious intracranial vascular hyperdensity. Skull: Negative. Sinuses/Orbits: Clear. Other: No acute orbit or scalp soft tissue findings. ASPECTS ([place] Stroke Program Early CT Score) - Ganglionic level infarction (caudate, lentiform nuclei, internal capsule, insula, M1-M3 cortex): 7 - Supraganglionic infarction (M4-M6 cortex): 3 Total score (0-10 with 10 being normal): 10 IMPRESSION: 1. Stable and normal noncontrast CT appearance of the brain. 2. ASPECTS is 10. 3. These results were communicated to Dr. Rory Percy at [place] 8/18/2019by text page via the [facility] messaging system. Electronically Signed   By: Genevie Ann M.D.   On: 06/10/2018 13:59    Procedures Procedures (including critical care time)  Medications Ordered in ED Medications  sodium chloride 0.9 % bolus 1,000 mL (has no administration in time range)  iopamidol (ISOVUE-370) 76 % injection 50 mL (50 mLs Intravenous Contrast Given 06/10/18 1414)     Initial Impression / Assessment and Plan / ED Course  I have reviewed the triage vital signs and the nursing notes.  Pertinent labs & imaging results that were available during my care of the patient were reviewed by me and considered in my medical decision making (see chart for details).  Clinical Course as of Jun 10 1736  Sun Jun 10, 2018  1447 Patient was stroke activation on arrival.  He was seen by Dr. Malen Gauze from neurology who examined the patient and reviewed his imaging.  He feels that the patient needs continued work-up for other causes of his symptoms but if no other concerning findings found he would recommend the patient get an MRI.   [MB]    Clinical Course User Index [MB] Hayden Rasmussen, MD    Patient was signed out to oncoming physician Dr Gustavus Messing. Plan is to followup on second trop and MRI brain results. If both are unremarkable, plan from neurology is  outpatient followup.   Final Clinical Impressions(s) / ED Diagnoses   Final diagnoses:  Neck pain  Chest pain, unspecified type  Left sided numbness    ED Discharge Orders    None       Hayden Rasmussen, MD 06/10/18 1739

## 2018-06-10 NOTE — Consult Note (Addendum)
Neurology Consultation  Reason for Consult: Code stroke for left-sided numbness Referring Physician: Chest pain, left-sided neck pain  CC: Code stroke for left-sided numbness  History is obtained from: EMS, patient  HPI: [name] is a 57 y.o. male PMH diabetes, hyperlipidemia, obesity, coronary artery disease, history of stroke, admission to the stroke service in 2018 with left-sided symptoms with MRI not positive for stroke, presents for evaluation when he had sudden onset of left-sided chest pain, shortness of breath along with pain radiating from his lower jaw to the arm while at church at around 11 AM. The symptoms have been persistent.  He denies any headaches.  He thinks this episode is different than what had brought him in in August 2018. At the time, he was discharged with dual antiplatelet therapy because of cardiac disease.  He was advised to get a MRI of the C-spine which showed multilevel degenerative disc disease.  Also showed spinal stenosis at C4-5 and C5-6 due to disc protrusions, stenosis greatest at C5-6 with mild cord flattening.  C5-6 right foraminal impingement from asymmetric uncovertebral spurring.  No left-sided foraminal narrowing.  LKW: 11am tpa given?: no, NIH stroke scale of 1 for sensory Premorbid modified Rankin scale (mRS): 2  ROS:ROS was performed and is negative except as noted in the HPI   Past Medical History:  Diagnosis Date  . Anemia   . Arthritis   . Back pain   . CAD (coronary artery disease)    a. s/p DES to LAD in 05/2016  . Cervical radiculopathy   . Chronic diastolic CHF (congestive heart failure) ([place])   . Chronic pain   . Depression   . DVT (deep venous thrombosis) ([place])   . Hematemesis   . Hepatic steatosis   . Hyperlipidemia   . Hypertension   . IBS (irritable bowel syndrome)   . Morbid obesity ([place])   . OSA (obstructive sleep apnea)   . Pancreatitis   . PE (pulmonary thromboembolism) ([place])   . Peripheral neuropathy   .  PUD (peptic ulcer disease)   . Renal disorder   . Stroke [facility])    a. ?details unclear - not seen on imaging when he was admitted in 05/2017 for TIA symptoms which were felt due to cervical radiculopathy.  . Thoracic aortic ectasia ([facility])    a. 4.3cm ectatic ascending thoracic aorta by CT 06/2017.   . Type 2 diabetes mellitus ([place])     Family History  Problem Relation Age of Onset  . Cancer Father   . Hypertension Mother   . Diabetes Mother   . Breast cancer Mother   . Hypertension Brother   . Diabetes Brother   . Hypertension Sister   . Diabetes Sister    Social History:   reports that he has never smoked. He has never used smokeless tobacco. He reports that he does not drink alcohol or use drugs.  Medications  Current Facility-Administered Medications:  .  sodium chloride 0.9 % bolus 1,000 mL, 1,000 mL, Intravenous, Once, Hayden Rasmussen, MD, Last Rate: 984 mL/hr at 06/10/18 1511, 1,000 mL at 06/10/18 1511  Current Outpatient Medications:  .  aspirin 81 MG chewable tablet, Chew 1 tablet (81 mg total) by mouth daily., Disp: 30 tablet, Rfl: 0 .  atorvastatin (LIPITOR) 80 MG tablet, Take 1 tablet (80 mg total) by mouth daily. (Patient taking differently: Take 80 mg by mouth daily at 6 PM. ), Disp: 90 tablet, Rfl: 3 .  diclofenac (FLECTOR) 1.3 %  PTCH, Place 1 patch onto the skin as needed. , Disp: , Rfl: 4 .  dicyclomine (BENTYL) 20 MG tablet, Take 1 tablet (20 mg total) by mouth 2 (two) times daily., Disp: 20 tablet, Rfl: 0 .  DULoxetine (CYMBALTA) 60 MG capsule, Take 1 capsule (60 mg total) by mouth daily., Disp: 30 capsule, Rfl: 8 .  famotidine (PEPCID) 40 MG tablet, Take 1 tablet (40 mg total) by mouth at bedtime. (Patient taking differently: Take 40 mg by mouth daily. ), Disp: 30 tablet, Rfl: 2 .  furosemide (LASIX) 40 MG tablet, Take 1 tablet (40 mg total) by mouth every evening. (Patient taking differently: Take 40 mg by mouth 3 (three) times daily. ), Disp: 30 tablet, Rfl:  .   glipiZIDE (GLUCOTROL) 10 MG tablet, Take 10 mg by mouth 2 (two) times daily., Disp: , Rfl: 4 .  hydrALAZINE (APRESOLINE) 25 MG tablet, Take 3 tablets (75 mg total) by mouth 3 (three) times daily., Disp: 540 tablet, Rfl: 3 .  Insulin Detemir (LEVEMIR) 100 UNIT/ML Pen, Inject 20 Units into the skin daily at 10 pm. (Patient taking differently: Inject 55 Units into the skin daily at 10 pm. ), Disp: 15 mL, Rfl: 3 .  isosorbide dinitrate (ISORDIL) 30 MG tablet, Take 1 tablet (30 mg total) by mouth every morning., Disp: 30 tablet, Rfl: 0 .  lisinopril (PRINIVIL,ZESTRIL) 10 MG tablet, Take 1 tablet (10 mg total) by mouth daily. (Patient taking differently: Take 40 mg by mouth daily. ), Disp: 30 tablet, Rfl: 0 .  LYRICA 100 MG capsule, Take 100 mg by mouth 2 (two) times daily., Disp: , Rfl: 4 .  metFORMIN (GLUCOPHAGE-XR) 500 MG 24 hr tablet, Take 2 tablets (1,000 mg total) by mouth 2 (two) times daily., Disp: , Rfl:  .  nitroGLYCERIN (NITROSTAT) 0.4 MG SL tablet, Place 0.4 mg under the tongue every 5 (five) minutes as needed for chest pain., Disp: , Rfl: 4 .  oxyCODONE (ROXICODONE) 15 MG immediate release tablet, Take 15 mg 3 (three) times daily as needed by mouth for pain. , Disp: , Rfl: 0 .  pantoprazole (PROTONIX) 40 MG tablet, Take 1 tablet (40 mg total) by mouth 2 (two) times daily., Disp: 60 tablet, Rfl: 1 .  potassium chloride SA (K-DUR,KLOR-CON) 20 MEQ tablet, Take 40 mEq by mouth daily. , Disp: , Rfl:  .  sertraline (ZOLOFT) 50 MG tablet, Take 50 mg by mouth daily., Disp: , Rfl:  .  sitaGLIPtin (JANUVIA) 100 MG tablet, Take 100 mg by mouth daily., Disp: , Rfl:  .  tiZANidine (ZANAFLEX) 2 MG tablet, Take 2 mg by mouth 2 (two) times daily as needed for pain., Disp: , Rfl:  .  traZODone (DESYREL) 50 MG tablet, Take 1 tablet (50 mg total) by mouth at bedtime., Disp: 30 tablet, Rfl: 11 .  carvedilol (COREG) 25 MG tablet, Take 1 tablet (25 mg total) by mouth 2 (two) times daily with a meal. (Patient not  taking: Reported on 06/06/2018), Disp: 180 tablet, Rfl: 3 .  diclofenac sodium (VOLTAREN) 1 % GEL, Apply 1 application topically 4 (four) times daily. (Patient not taking: Reported on 06/06/2018), Disp: 100 g, Rfl: 0 .  furosemide (LASIX) 80 MG tablet, Take 1 tablet (80 mg total) by mouth daily. (Patient not taking: Reported on 06/06/2018), Disp: 30 tablet, Rfl:  .  hydrALAZINE (APRESOLINE) 50 MG tablet, TAKE 1 TABLET (50 MG TOTAL) BY MOUTH 3 (THREE) TIMES DAILY. (Patient not taking: Reported on 06/10/2018), Disp: 90 tablet,  Rfl: 3 .  Insulin Pen Needle 31G X 5 MM MISC, Use 1 needle daily to inject insulin as prescribed, Disp: 100 each, Rfl: 2 .  oxyCODONE-acetaminophen (PERCOCET/ROXICET) 5-325 MG tablet, Take 1-2 tablets by mouth every 6 (six) hours as needed for moderate pain or severe pain. (Patient not taking: Reported on 06/06/2018), Disp: 15 tablet, Rfl: 0  Exam: Current vital signs: BP 108/61   Pulse 99   Temp 99.1 F (37.3 C) (Oral)   Resp 20   Ht 5\' 10"  (1.778 m)   Wt 131.5 kg   SpO2 97%   BMI 41.61 kg/m  Vital signs in last 24 hours: Temp:  [99.1 F (37.3 C)] 99.1 F (37.3 C) (08/18 1322) Pulse Rate:  [99] 99 (08/18 1322) Resp:  [20] 20 (08/18 1322) BP: (108)/(61) 108/61 (08/18 1322) SpO2:  [97 %] 97 % (08/18 1322) Weight:  [131.5 kg] 131.5 kg (08/18 1322)  GENERAL: Awake, alert in NAD HEENT: - Normocephalic and atraumatic, dry mm, no LN++, no Thyromegally LUNGS - Clear to auscultation bilaterally with no wheezes CV - S1S2 RRR, no m/r/g, equal pulses bilaterally. ABDOMEN -obese, nontender, with normoactive BS Ext: warm, well perfused, intact peripheral pulses, no edema  NEURO:  Mental Status: AA&Ox3  Language: speech is not dysarthric.  Naming, repetition, fluency, and comprehension intact. Cranial Nerves: PERRL, visual fields full, no facial asymmetry, facial sensation decreased on the left, hearing intact, tongue/uvula/soft palate midline, normal sternocleidomastoid and  trapezius muscle strength. No evidence of tongue atrophy or fibrillations Motor: 5/5 right upper extremity, 5/5 left upper extremity.  Limited by pain strength exam on the left lower extremity due to chronic pain there.  No weakness on the right lower extremity. Tone: is normal and bulk is normal Sensation-decreased sensation to all modalities in the left hemibody Coordination: FTN intact bilaterally Gait- deferred  NIHSS-1 for sensory   Labs I have reviewed labs in epic and the results pertinent to this consultation are:  CBC    Component Value Date/Time   WBC 10.2 06/10/2018 1407   RBC 4.18 (L) 06/10/2018 1407   HGB 13.3 06/10/2018 1429   HCT 39.0 06/10/2018 1429   PLT 261 06/10/2018 1407   MCV 92.6 06/10/2018 1407   MCH 29.2 06/10/2018 1407   MCHC 31.5 06/10/2018 1407   RDW 14.1 06/10/2018 1407   LYMPHSABS 2.6 06/10/2018 1407   MONOABS 0.7 06/10/2018 1407   EOSABS 0.1 06/10/2018 1407   BASOSABS 0.1 06/10/2018 1407    CMP     Component Value Date/Time   NA 137 06/10/2018 1429   NA 141 01/24/2018 1055   K 3.7 06/10/2018 1429   CL 98 06/10/2018 1429   CO2 25 06/10/2018 1407   GLUCOSE 259 (H) 06/10/2018 1429   BUN 12 06/10/2018 1429   BUN 9 01/24/2018 1055   CREATININE 0.60 (L) 06/10/2018 1429   CREATININE 0.92 08/30/2016 1526   CALCIUM 8.6 (L) 06/10/2018 1407   PROT 6.9 06/10/2018 1407   ALBUMIN 3.6 06/10/2018 1407   AST 31 06/10/2018 1407   ALT 33 06/10/2018 1407   ALKPHOS 47 06/10/2018 1407   BILITOT 0.7 06/10/2018 1407   GFRNONAA >60 06/10/2018 1407   GFRAA >60 06/10/2018 1407   Imaging I have reviewed the images obtained:  CT-scan of the brain-noncontrast CT of the head is unremarkable. CT angiogram head and the neck negative for LVO.  No significant carotid or vertebral artery stenosis.  Assessment:  57 year old man past history of diabetes hyperlipidemia obesity  coronary artery disease history of recommended stroke, and admission for evaluation of  stroke with MRI negative for stroke in 2018 with left-sided symptoms with no residuals right now, presents for left-sided weakness that started in the setting of chest pain or shortness of breath. On my examination, there is subjective decreased sensation on the left but no other objective findings. He was in the window for IV TPA but was not given IV TPA because of mild symptoms. CT scan of the brain noncontrast as well as CT Angie of head and neck were negative for LVO. At this time, I think his symptoms might be related to worsening of his cervical radiculopathy. He would benefit from an outpatient evaluation for cervical adenopathy and he has establish care for that. From an inpatient work-up perspective, I think he should only get a MRI of the brain without contrast.  If that is negative.  No further work-up inpatient is required.  Recommendations: MRI brain without contrast-no further work-up needed if negative for stroke. Outpatient neurology follow-up for sleep apnea as well as potential cervical radiculopathy.. Please call with questions.  -- Amie Portland, MD [facility] Pager: [phone] If 7pm to 7am, please call on call as listed on AMION.   ADDENDUM MRI Reviewed 1756 hrs - no acute stroke. Official read pending. Await official read and if confirms no acute stroke, discharged home with outpatient neurology follow-up.  Plan has been relayed to the ED providers.

## 2018-06-10 NOTE — ED Notes (Signed)
Pt states decreased tingling and L chest pain after hot packs placed.

## 2018-06-18 ENCOUNTER — Other Ambulatory Visit: Payer: Self-pay | Admitting: Orthopedic Surgery

## 2018-06-18 DIAGNOSIS — M4722 Other spondylosis with radiculopathy, cervical region: Secondary | ICD-10-CM

## 2018-06-29 ENCOUNTER — Ambulatory Visit
Admission: RE | Admit: 2018-06-29 | Discharge: 2018-06-29 | Disposition: A | Discharge status: Home / Self Care | Payer: Medicaid Other | Source: Ambulatory Visit | Attending: Orthopedic Surgery | Admitting: Orthopedic Surgery

## 2018-06-29 DIAGNOSIS — M4722 Other spondylosis with radiculopathy, cervical region: Secondary | ICD-10-CM

## 2018-07-03 ENCOUNTER — Encounter (HOSPITAL_COMMUNITY): Payer: Self-pay | Admitting: Emergency Medicine

## 2018-07-03 ENCOUNTER — Emergency Department (HOSPITAL_COMMUNITY)
Admission: EM | Admit: 2018-07-03 | Discharge: 2018-07-03 | Disposition: A | Discharge status: Home / Self Care | Payer: Medicaid Other | Attending: Emergency Medicine | Admitting: Emergency Medicine

## 2018-07-03 ENCOUNTER — Emergency Department (HOSPITAL_COMMUNITY): Payer: Medicaid Other

## 2018-07-03 DIAGNOSIS — Z79899 Other long term (current) drug therapy: Secondary | ICD-10-CM | POA: Diagnosis not present

## 2018-07-03 DIAGNOSIS — I2510 Atherosclerotic heart disease of native coronary artery without angina pectoris: Secondary | ICD-10-CM | POA: Diagnosis not present

## 2018-07-03 DIAGNOSIS — I110 Hypertensive heart disease with heart failure: Secondary | ICD-10-CM | POA: Insufficient documentation

## 2018-07-03 DIAGNOSIS — E1151 Type 2 diabetes mellitus with diabetic peripheral angiopathy without gangrene: Secondary | ICD-10-CM | POA: Insufficient documentation

## 2018-07-03 DIAGNOSIS — Z794 Long term (current) use of insulin: Secondary | ICD-10-CM | POA: Insufficient documentation

## 2018-07-03 DIAGNOSIS — Z7982 Long term (current) use of aspirin: Secondary | ICD-10-CM | POA: Diagnosis not present

## 2018-07-03 DIAGNOSIS — I5032 Chronic diastolic (congestive) heart failure: Secondary | ICD-10-CM | POA: Diagnosis not present

## 2018-07-03 DIAGNOSIS — R200 Anesthesia of skin: Secondary | ICD-10-CM | POA: Insufficient documentation

## 2018-07-03 LAB — BASIC METABOLIC PANEL
Anion gap: 15 (ref 5–15)
BUN: 12 mg/dL (ref 6–20)
CO2: 30 mmol/L (ref 22–32)
Calcium: 9.1 mg/dL (ref 8.9–10.3)
Chloride: 98 mmol/L (ref 98–111)
Creatinine, Ser: 0.66 mg/dL (ref 0.61–1.24)
GFR calc Af Amer: >60 mL/min (ref >60)
GFR calc non Af Amer: >60 mL/min (ref >60)
Glucose, Bld: 214 mg/dL — ABNORMAL HIGH (ref 70–99)
Potassium: 3.3 mmol/L — ABNORMAL LOW (ref 3.5–5.1)
Sodium: 143 mmol/L (ref 135–145)

## 2018-07-03 LAB — CBC
HCT: 37.2 % — ABNORMAL LOW (ref 39.0–52.0)
Hemoglobin: 12.1 g/dL — ABNORMAL LOW (ref 13.0–17.0)
MCH: 29.7 pg (ref 26.0–34.0)
MCHC: 32.5 g/dL (ref 30.0–36.0)
MCV: 91.2 fL (ref 78.0–100.0)
Platelets: 296 10*3/uL (ref 150–400)
RBC: 4.08 MIL/uL — ABNORMAL LOW (ref 4.22–5.81)
RDW: 15 % (ref 11.5–15.5)
WBC: 10.7 10*3/uL — ABNORMAL HIGH (ref 4.0–10.5)

## 2018-07-03 MED ORDER — IOPAMIDOL (ISOVUE-370) INJECTION 76%
80.0000 mL | Freq: Once | INTRAVENOUS | Status: AC | PRN
  Administered 2018-07-03: 100 mL via INTRAVENOUS

## 2018-07-03 MED ORDER — IOPAMIDOL (ISOVUE-370) INJECTION 76%
INTRAVENOUS | Status: AC
  Filled 2018-07-03: qty 100

## 2018-07-03 MED ORDER — OXYCODONE-ACETAMINOPHEN 5-325 MG PO TABS
3.0000 | ORAL_TABLET | Freq: Once | ORAL | Status: AC
  Administered 2018-07-03: 3 via ORAL
  Filled 2018-07-03: qty 3

## 2018-07-03 NOTE — ED Provider Notes (Signed)
[name]   CSN: [id number] Arrival date & time: 07/03/18  1004     History   Chief Complaint Chief Complaint  Patient presents with  . Blurred Vision  . arm numbness  . Headache    HPI [name] is a 57 y.o. male.  HPI Patient is a 57 year old male presents to the emergency department with complaints of left facial numbness and transient blurred vision as well as left-sided headache over the past 5 to 6 hours.  He states this started around 7 AM while he was in the shower.  He is currently being seen and evaluated by a spine specialist for cervical radiculopathy.  He was seen by a spinal specialist today who did not believe the cause was coming from his cervical spine as the patient is had a recent MRI which was somewhat reassuring to the spine specialist.  Given the patient's complaints of symptoms in the office he recommended the ER for further evaluation of the left-sided facial numbness.  Denies new weakness in the arms or legs.  No fevers or chills.  No history of complicated migraines.  No recent head injury or trauma   Past Medical History:  Diagnosis Date  . Anemia   . Arthritis   . Back pain   . CAD (coronary artery disease)    a. s/p DES to LAD in 05/2016  . Cervical radiculopathy   . Chronic diastolic CHF (congestive heart failure) ([place])   . Chronic pain   . Depression   . DVT (deep venous thrombosis) ([place])   . Hematemesis   . Hepatic steatosis   . Hyperlipidemia   . Hypertension   . IBS (irritable bowel syndrome)   . Morbid obesity ([place])   . OSA (obstructive sleep apnea)   . Pancreatitis   . PE (pulmonary thromboembolism) ([place])   . Peripheral neuropathy   . PUD (peptic ulcer disease)   . Renal disorder   . Stroke [facility])    a. ?details unclear - not seen on imaging when he was admitted in 05/2017 for TIA symptoms which were felt due to cervical radiculopathy.  . Thoracic aortic ectasia (HCC)    a.  4.3cm ectatic ascending thoracic aorta by CT 06/2017.   . Type 2 diabetes mellitus [facility])     Patient Active Problem List   Diagnosis Date Noted  . B12 deficiency 08/10/2017  . Persistent headaches 08/09/2017  . GERD (gastroesophageal reflux disease) 06/29/2017  . Left arm numbness   . Cerebral embolism with cerebral infarction 06/17/2017  . TIA (transient ischemic attack) 06/17/2017  . Chronic back pain 12/29/2016  . Depression 12/15/2016  . Vitamin D deficiency 12/09/2016  . Right hip pain 12/07/2016  . Hypokalemia 12/07/2016  . Type 2 diabetes mellitus with vascular disease ([place]) 05/31/2016  . Normocytic normochromic anemia 05/31/2016  . Chest pain 05/31/2016  . CAD (coronary artery disease) 01/06/2016  . DVT (deep venous thrombosis) ([place]) 01/06/2016  . Lactic acidosis 05/28/2014  . Nonspecific chest pain 01/29/2014  . Uncontrolled secondary diabetes with peripheral neuropathy ([place]) 01/29/2014  . Obesity, Class III, BMI 40-49.9 (morbid obesity) ([place]) 01/29/2014  . Snoring 01/29/2014  . Dyslipidemia 01/29/2014  . HTN (hypertension) 01/29/2014  . Abnormal nuclear stress test 01/29/2014    Past Surgical History:  Procedure Laterality Date  . CARDIAC CATHETERIZATION N/A 05/31/2016   Procedure: Left Heart Cath and Coronary Angiography;  Surgeon: Peter M Martinique, MD;  Location: [place] [facility];  Service: Cardiovascular;  Laterality: N/A;  . CARDIAC CATHETERIZATION N/A 05/31/2016   Procedure: Intravascular Pressure Wire/FFR Study;  Surgeon: Peter M Martinique, MD;  Location: [place] [facility];  Service: Cardiovascular;  Laterality: N/A;  . CARDIAC CATHETERIZATION N/A 05/31/2016   Procedure: Coronary Stent Intervention;  Surgeon: Peter M Martinique, MD;  Location: [place] CV [facility];  Service: Cardiovascular;  Laterality: N/A;  . LEFT HEART CATH AND CORONARY ANGIOGRAPHY N/A 12/08/2017   Procedure: LEFT HEART CATH AND CORONARY ANGIOGRAPHY;  Surgeon: Leonie Man, MD;  Location: [place]  CV LAB;  Service: Cardiovascular;  Laterality: N/A;  . LEFT HEART CATHETERIZATION WITH CORONARY ANGIOGRAM N/A 02/03/2014   Procedure: LEFT HEART CATHETERIZATION WITH CORONARY ANGIOGRAM;  Surgeon: Pixie Casino, MD;  Location: [facility] CATH LAB;  Service: Cardiovascular;  Laterality: N/A;  . left leg stent           Home Medications    Prior to Admission medications   Medication Sig Start Date End Date Taking? Authorizing Provider  aspirin 81 MG chewable tablet Chew 1 tablet (81 mg total) by mouth daily. 06/01/16  Yes Elwin Mocha, MD  atorvastatin (LIPITOR) 80 MG tablet Take 1 tablet (80 mg total) by mouth daily. Patient taking differently: Take 80 mg by mouth daily at 6 PM.  01/26/17  Yes Strader, Fransisco Hertz, PA-C  carvedilol (COREG) 25 MG tablet Take 1 tablet (25 mg total) by mouth 2 (two) times daily with a meal. 01/26/17  Yes Strader, [place] M, PA-C  DULoxetine (CYMBALTA) 60 MG capsule Take 1 capsule (60 mg total) by mouth daily. 03/08/18  Yes Marcial Pacas, MD  famotidine (PEPCID) 40 MG tablet Take 1 tablet (40 mg total) by mouth at bedtime. Patient taking differently: Take 40 mg by mouth daily.  06/18/17  Yes Barton Dubois, MD  furosemide (LASIX) 40 MG tablet Take 1 tablet (40 mg total) by mouth every evening. Patient taking differently: Take 40 mg by mouth 3 (three) times daily.  12/09/17  Yes Velvet Bathe, MD  glipiZIDE (GLUCOTROL) 10 MG tablet Take 10 mg by mouth 2 (two) times daily. 05/30/17  Yes [provider]  hydrALAZINE (APRESOLINE) 50 MG tablet TAKE 1 TABLET (50 MG TOTAL) BY MOUTH 3 (THREE) TIMES DAILY. Patient taking differently: Take 50 mg by mouth 3 (three) times daily.  03/27/18  Yes Lelon Perla, MD  Insulin Detemir (LEVEMIR) 100 UNIT/ML Pen Inject 20 Units into the skin daily at 10 pm. Patient taking differently: Inject 55 Units into the skin daily at 10 pm.  06/18/17  Yes Barton Dubois, MD  isosorbide dinitrate (ISORDIL) 30 MG tablet Take 1 tablet (30 mg total) by  mouth every morning. 12/10/17  Yes Velvet Bathe, MD  isosorbide mononitrate (IMDUR) 30 MG 24 hr tablet Take 30 mg by mouth daily. 06/27/18  Yes [provider]  lisinopril (PRINIVIL,ZESTRIL) 10 MG tablet Take 1 tablet (10 mg total) by mouth daily. Patient taking differently: Take 40 mg by mouth daily.  12/10/17  Yes Velvet Bathe, MD  LYRICA 100 MG capsule Take 100 mg by mouth 2 (two) times daily. 11/29/17  Yes [provider]  magnesium oxide (MAG-OX) 400 MG tablet Take 400 mg by mouth daily. 06/28/18  Yes [provider]  metFORMIN (GLUCOPHAGE-XR) 500 MG 24 hr tablet Take 2 tablets (1,000 mg total) by mouth 2 (two) times daily. 07/02/17  Yes Barton Dubois, MD  oxyCODONE (ROXICODONE) 15 MG immediate release tablet Take 15 mg 3 (three) times daily  as needed by mouth for pain.  08/28/17  Yes [provider]  pantoprazole (PROTONIX) 40 MG tablet Take 1 tablet (40 mg total) by mouth 2 (two) times daily. 06/18/17  Yes Barton Dubois, MD  potassium chloride (MICRO-K) 10 MEQ CR capsule Take 20 mEq by mouth 3 (three) times daily with meals. 20 meq at lunch, 10 meq at dinner and 10 meq at bedtime 06/28/18  Yes [provider]  potassium chloride SA (K-DUR,KLOR-CON) 20 MEQ tablet Take 20-40 mEq by mouth daily. 40 meq in the lunch, 20 med at dinner and 20 meq at bedtime   Yes [provider]  sertraline (ZOLOFT) 50 MG tablet Take 50 mg by mouth daily.   Yes [provider]  sitaGLIPtin (JANUVIA) 100 MG tablet Take 100 mg by mouth daily.   Yes [provider]  tiZANidine (ZANAFLEX) 2 MG tablet Take 2 mg by mouth 2 (two) times daily as needed for pain.   Yes [provider]  traZODone (DESYREL) 50 MG tablet Take 1 tablet (50 mg total) by mouth at bedtime. 12/15/16  Yes Burns, Alexa R, MD  diclofenac sodium (VOLTAREN) 1 % GEL Apply 1 application topically 4 (four) times daily. Patient not taking: Reported on 07/03/2018 04/05/18   Melynda Ripple,  MD  dicyclomine (BENTYL) 20 MG tablet Take 1 tablet (20 mg total) by mouth 2 (two) times daily. Patient not taking: Reported on 07/03/2018 06/06/18   Palumbo, April, MD  furosemide (LASIX) 80 MG tablet Take 1 tablet (80 mg total) by mouth daily. Patient not taking: Reported on 06/06/2018 12/10/17   Velvet Bathe, MD  hydrALAZINE (APRESOLINE) 25 MG tablet Take 3 tablets (75 mg total) by mouth 3 (three) times daily. Patient not taking: Reported on 07/03/2018 01/30/18   Barrett, Evelene Croon, PA-C  Insulin Pen Needle 31G X 5 MM MISC Use 1 needle daily to inject insulin as prescribed 06/18/17   Barton Dubois, MD  nitroGLYCERIN (NITROSTAT) 0.4 MG SL tablet Place 0.4 mg under the tongue every 5 (five) minutes as needed for chest pain. 12/17/16   [provider]  oxyCODONE-acetaminophen (PERCOCET/ROXICET) 5-325 MG tablet Take 1-2 tablets by mouth every 6 (six) hours as needed for moderate pain or severe pain. Patient not taking: Reported on 07/03/2018 05/17/18   Duffy Bruce, MD    Family History Family History  Problem Relation Age of Onset  . Cancer Father   . Hypertension Mother   . Diabetes Mother   . Breast cancer Mother   . Hypertension Brother   . Diabetes Brother   . Hypertension Sister   . Diabetes Sister     Social History Social History   Tobacco Use  . Smoking status: Never Smoker  . Smokeless tobacco: Never Used  Substance Use Topics  . Alcohol use: No  . Drug use: No     Allergies   Coconut flavor [flavoring agent]; Coconut oil; Ibuprofen; Aleve [naproxen]; and Nsaids   Review of Systems Review of Systems  All other systems reviewed and are negative.    Physical Exam Updated Vital Signs BP (!) 117/57   Pulse 74   Temp 98.7 F (37.1 C) (Oral)   Resp 12   Ht 5\' 10"  (1.778 m)   Wt 135.6 kg   SpO2 92%   BMI 42.90 kg/m   Physical Exam  Constitutional: He is oriented to person, place, and time. He appears well-developed and well-nourished.  HENT:  Head:  Normocephalic and atraumatic.  Eyes: Pupils are  equal, round, and reactive to light. EOM are normal.  Neck: Normal range of motion.  Cardiovascular: Normal rate, regular rhythm, normal heart sounds and intact distal pulses.  Pulmonary/Chest: Effort normal and breath sounds normal. No respiratory distress.  Abdominal: Soft. He exhibits no distension. There is no tenderness.  Musculoskeletal: Normal range of motion.  Neurological: He is alert and oriented to person, place, and time.  5/5 strength in major muscle groups of  bilateral upper and lower extremities. Speech normal. No facial asymetry.   Skin: Skin is warm and dry.  Psychiatric: He has a normal mood and affect. Judgment normal.  Nursing Stein and vitals reviewed.    ED Treatments / Results  Labs (all labs ordered are listed, but only abnormal results are displayed) Labs Reviewed  CBC - Abnormal; Notable for the following components:      Result Value   WBC 10.7 (*)    RBC 4.08 (*)    Hemoglobin 12.1 (*)    HCT 37.2 (*)    All other components within normal limits  BASIC METABOLIC PANEL - Abnormal; Notable for the following components:   Potassium 3.3 (*)    Glucose, Bld 214 (*)    All other components within normal limits    EKG None  Radiology Ct Angio Head W Or Wo Contrast  Result Date: 07/03/2018 CLINICAL DATA:  Numbness or tingling, paresthesia. Headache with blurred vision and left arm numbness. EXAM: CT ANGIOGRAPHY HEAD AND NECK TECHNIQUE: Multidetector CT imaging of the head and neck was performed using the standard protocol during bolus administration of intravenous contrast. Multiplanar CT image reconstructions and MIPs were obtained to evaluate the vascular anatomy. Carotid stenosis measurements (when applicable) are obtained utilizing NASCET criteria, using the distal internal carotid diameter as the denominator. CONTRAST:  113mL ISOVUE-370 IOPAMIDOL (ISOVUE-370) INJECTION 76% COMPARISON:  CTA head neck  06/10/2018. FINDINGS: CT HEAD FINDINGS Brain: No evidence of acute infarction, hemorrhage, hydrocephalus, extra-axial collection or mass lesion/mass effect. Vascular: Atherosclerotic calcification of the carotid siphons. Skull: No acute or aggressive finding Sinuses: Negative Orbits: Negative Review of the MIP images confirms the above findings CTA NECK FINDINGS Aortic arch: Normal. Right carotid system: Atheromatous wall thickening of the distal common carotid. Calcified plaque at the ICA bulb. No flow limiting stenosis or ulceration. Negative for beading. Left carotid system: Mild atheromatous wall thickening mainly about the common carotid bifurcation. No stenosis or ulceration. Vertebral arteries: No proximal subclavian stenosis or atherosclerosis. The vertebrals are smooth and widely patent. Skeleton: Spondylosis and posterior longitudinal ligament ossification causes spinal stenosis as characterized by MRI 4 days ago. No acute osseous finding. Other neck: Negative Upper chest: Negative Review of the MIP images confirms the above findings CTA HEAD FINDINGS Anterior circulation: Calcified plaque on the carotid siphons with mild left mid cavernous narrowing. No branch occlusion, beading, or aneurysm. Posterior circulation: Codominant vertebral arteries. The vertebral and basilar arteries are smooth and widely patent. Posterior circulation branches are symmetrically opacified. Venous sinuses: Patent. Anatomic variants: None significant Delayed phase: No abnormal intracranial enhancement Review of the MIP images confirms the above findings IMPRESSION: 1. No emergent finding.  Stable from CTA last month. 2. Atherosclerosis without flow limiting stenosis in the head or neck. 3. 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 Electronically Signed   By: Monte Fantasia M.D.   On: 07/03/2018 14:24   Ct Angio Neck W And/or Wo  Contrast  Result Date: 07/03/2018  CLINICAL DATA:  Numbness or tingling, paresthesia. Headache with blurred vision and left arm numbness. EXAM: CT ANGIOGRAPHY HEAD AND NECK TECHNIQUE: Multidetector CT imaging of the head and neck was performed using the standard protocol during bolus administration of intravenous contrast. Multiplanar CT image reconstructions and MIPs were obtained to evaluate the vascular anatomy. Carotid stenosis measurements (when applicable) are obtained utilizing NASCET criteria, using the distal internal carotid diameter as the denominator. CONTRAST:  160mL ISOVUE-370 IOPAMIDOL (ISOVUE-370) INJECTION 76% COMPARISON:  CTA head neck 06/10/2018. FINDINGS: CT HEAD FINDINGS Brain: No evidence of acute infarction, hemorrhage, hydrocephalus, extra-axial collection or mass lesion/mass effect. Vascular: Atherosclerotic calcification of the carotid siphons. Skull: No acute or aggressive finding Sinuses: Negative Orbits: Negative Review of the MIP images confirms the above findings CTA NECK FINDINGS Aortic arch: Normal. Right carotid system: Atheromatous wall thickening of the distal common carotid. Calcified plaque at the ICA bulb. No flow limiting stenosis or ulceration. Negative for beading. Left carotid system: Mild atheromatous wall thickening mainly about the common carotid bifurcation. No stenosis or ulceration. Vertebral arteries: No proximal subclavian stenosis or atherosclerosis. The vertebrals are smooth and widely patent. Skeleton: Spondylosis and posterior longitudinal ligament ossification causes spinal stenosis as characterized by MRI 4 days ago. No acute osseous finding. Other neck: Negative Upper chest: Negative Review of the MIP images confirms the above findings CTA HEAD FINDINGS Anterior circulation: Calcified plaque on the carotid siphons with mild left mid cavernous narrowing. No branch occlusion, beading, or aneurysm. Posterior circulation: Codominant vertebral arteries. The  vertebral and basilar arteries are smooth and widely patent. Posterior circulation branches are symmetrically opacified. Venous sinuses: Patent. Anatomic variants: None significant Delayed phase: No abnormal intracranial enhancement Review of the MIP images confirms the above findings IMPRESSION: 1. No emergent finding.  Stable from CTA last month. 2. Atherosclerosis without flow limiting stenosis in the head or neck. 3. 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 Electronically Signed   By: Monte Fantasia M.D.   On: 07/03/2018 14:24    Procedures Procedures (including critical care time)  Medications Ordered in ED Medications  iopamidol (ISOVUE-370) 76 % injection (has no administration in time range)  iopamidol (ISOVUE-370) 76 % injection (has no administration in time range)  oxyCODONE-acetaminophen (PERCOCET/ROXICET) 5-325 MG per tablet 3 tablet (3 tablets Oral Given 07/03/18 1139)  iopamidol (ISOVUE-370) 76 % injection 80 mL (100 mLs Intravenous Contrast Given 07/03/18 1346)     Initial Impression / Assessment and Plan / ED Course  I have reviewed the triage vital signs and the nursing notes.  Pertinent labs & imaging results that were available during my care of the patient were reviewed by me and considered in my medical decision making (see chart for details).     CTA and CT head as well as CT angios neck without acute abnormality.  Chronic findings.  Primary care and neurology follow-up.  No indication for additional work-up at this time.   Final Clinical Impressions(s) / ED Diagnoses   Final diagnoses:  Facial numbness    ED Discharge Orders    None       Jola Schmidt, MD 07/03/18 1450

## 2018-07-03 NOTE — ED Triage Notes (Signed)
Pt c/o headache, blurred vision and left arm numbness that started around 7am this morning while in shower and heard popping and grinding when lifted arm up. Went to spine specialist and was advised to to go ED. Pt denies left leg numbness like episode in August.

## 2018-07-12 NOTE — Progress Notes (Signed)
HPI: FU CAD and diastolic CHF.Pt had DES to LAD 8/17. CTA 9/17 showed no pulmonary embolus.Last echocardiogram September 2018 showed normal LV function and grade 2 diastolic dysfunction. Cardiac catheterization February 2019 showed patent stents and normal LV function.  Left ventricular end-diastolic pressure elevated at 27 mmHg.    Carotid Dopplers April 2019 showed 1 to 39% right and near normal left carotid.  CTA August 2019 showed no thoracic aortic aneurysm.  Since last seenoccasional dyspnea but no chest pain.  He denies increased pedal edema or syncope.  Current Outpatient Medications  Medication Sig Dispense Refill  . aspirin 81 MG chewable tablet Chew 1 tablet (81 mg total) by mouth daily. 30 tablet 0  . atorvastatin (LIPITOR) 80 MG tablet Take 1 tablet (80 mg total) by mouth daily. (Patient taking differently: Take 80 mg by mouth daily at 6 PM. ) 90 tablet 3  . carvedilol (COREG) 25 MG tablet Take 1 tablet (25 mg total) by mouth 2 (two) times daily with a meal. 180 tablet 3  . diclofenac sodium (VOLTAREN) 1 % GEL Apply 1 application topically 4 (four) times daily. 100 g 0  . dicyclomine (BENTYL) 20 MG tablet Take 1 tablet (20 mg total) by mouth 2 (two) times daily. 20 tablet 0  . DULoxetine (CYMBALTA) 60 MG capsule Take 1 capsule (60 mg total) by mouth daily. 30 capsule 8  . famotidine (PEPCID) 40 MG tablet Take 1 tablet (40 mg total) by mouth at bedtime. (Patient taking differently: Take 40 mg by mouth daily. ) 30 tablet 2  . furosemide (LASIX) 40 MG tablet Take 1 tablet (40 mg total) by mouth every evening. (Patient taking differently: Take 40 mg by mouth 3 (three) times daily. ) 30 tablet   . furosemide (LASIX) 80 MG tablet Take 1 tablet (80 mg total) by mouth daily. 30 tablet   . glipiZIDE (GLUCOTROL) 10 MG tablet Take 10 mg by mouth 2 (two) times daily.  4  . hydrALAZINE (APRESOLINE) 25 MG tablet Take 3 tablets (75 mg total) by mouth 3 (three) times daily. 540 tablet 3  .  hydrALAZINE (APRESOLINE) 50 MG tablet TAKE 1 TABLET (50 MG TOTAL) BY MOUTH 3 (THREE) TIMES DAILY. (Patient taking differently: Take 50 mg by mouth 3 (three) times daily. ) 90 tablet 3  . Insulin Detemir (LEVEMIR) 100 UNIT/ML Pen Inject 20 Units into the skin daily at 10 pm. (Patient taking differently: Inject 55 Units into the skin daily at 10 pm. ) 15 mL 3  . Insulin Pen Needle 31G X 5 MM MISC Use 1 needle daily to inject insulin as prescribed 100 each 2  . isosorbide dinitrate (ISORDIL) 30 MG tablet Take 1 tablet (30 mg total) by mouth every morning. 30 tablet 0  . isosorbide mononitrate (IMDUR) 30 MG 24 hr tablet Take 30 mg by mouth daily.  2  . lisinopril (PRINIVIL,ZESTRIL) 10 MG tablet Take 1 tablet (10 mg total) by mouth daily. (Patient taking differently: Take 40 mg by mouth daily. ) 30 tablet 0  . LYRICA 100 MG capsule Take 100 mg by mouth 2 (two) times daily.  4  . magnesium oxide (MAG-OX) 400 MG tablet Take 400 mg by mouth daily.  0  . metFORMIN (GLUCOPHAGE-XR) 500 MG 24 hr tablet Take 2 tablets (1,000 mg total) by mouth 2 (two) times daily.    . nitroGLYCERIN (NITROSTAT) 0.4 MG SL tablet Place 0.4 mg under the tongue every 5 (five) minutes as needed  for chest pain.  4  . oxyCODONE (ROXICODONE) 15 MG immediate release tablet Take 15 mg 3 (three) times daily as needed by mouth for pain.   0  . oxyCODONE-acetaminophen (PERCOCET/ROXICET) 5-325 MG tablet Take 1-2 tablets by mouth every 6 (six) hours as needed for moderate pain or severe pain. 15 tablet 0  . pantoprazole (PROTONIX) 40 MG tablet Take 1 tablet (40 mg total) by mouth 2 (two) times daily. 60 tablet 1  . potassium chloride (MICRO-K) 10 MEQ CR capsule Take 20 mEq by mouth 3 (three) times daily with meals. 20 meq at lunch, 10 meq at dinner and 10 meq at bedtime  0  . potassium chloride SA (K-DUR,KLOR-CON) 20 MEQ tablet Take 20-40 mEq by mouth daily. 40 meq in the lunch, 20 med at dinner and 20 meq at bedtime    . sertraline (ZOLOFT) 50  MG tablet Take 50 mg by mouth daily.    . sitaGLIPtin (JANUVIA) 100 MG tablet Take 100 mg by mouth daily.    Marland Kitchen tiZANidine (ZANAFLEX) 2 MG tablet Take 2 mg by mouth 2 (two) times daily as needed for pain.    . traZODone (DESYREL) 50 MG tablet Take 1 tablet (50 mg total) by mouth at bedtime. 30 tablet 11   No current facility-administered medications for this visit.      Past Medical History:  Diagnosis Date  . Anemia   . Arthritis   . Back pain   . CAD (coronary artery disease)    a. s/p DES to LAD in 05/2016  . Cervical radiculopathy   . Chronic diastolic CHF (congestive heart failure) ([place])   . Chronic pain   . Depression   . DVT (deep venous thrombosis) ([place])   . Hematemesis   . Hepatic steatosis   . Hyperlipidemia   . Hypertension   . IBS (irritable bowel syndrome)   . Morbid obesity ([place])   . OSA (obstructive sleep apnea)   . Pancreatitis   . PE (pulmonary thromboembolism) ([place])   . Peripheral neuropathy   . PUD (peptic ulcer disease)   . Renal disorder   . Stroke [facility])    a. ?details unclear - not seen on imaging when he was admitted in 05/2017 for TIA symptoms which were felt due to cervical radiculopathy.  . Thoracic aortic ectasia ([facility])    a. 4.3cm ectatic ascending thoracic aorta by CT 06/2017.   . Type 2 diabetes mellitus ([place])     Past Surgical History:  Procedure Laterality Date  . CARDIAC CATHETERIZATION N/A 05/31/2016   Procedure: Left Heart Cath and Coronary Angiography;  Surgeon: Peter M Martinique, MD;  Location: [place] [facility] LAB;  Service: Cardiovascular;  Laterality: N/A;  . CARDIAC CATHETERIZATION N/A 05/31/2016   Procedure: Intravascular Pressure Wire/FFR Study;  Surgeon: Peter M Martinique, MD;  Location: [place] CV LAB;  Service: Cardiovascular;  Laterality: N/A;  . CARDIAC CATHETERIZATION N/A 05/31/2016   Procedure: Coronary Stent Intervention;  Surgeon: Peter M Martinique, MD;  Location: [place] CV LAB;  Service: Cardiovascular;  Laterality: N/A;  . LEFT  HEART CATH AND CORONARY ANGIOGRAPHY N/A 12/08/2017   Procedure: LEFT HEART CATH AND CORONARY ANGIOGRAPHY;  Surgeon: Leonie Man, MD;  Location: [place] CV LAB;  Service: Cardiovascular;  Laterality: N/A;  . LEFT HEART CATHETERIZATION WITH CORONARY ANGIOGRAM N/A 02/03/2014   Procedure: LEFT HEART CATHETERIZATION WITH CORONARY ANGIOGRAM;  Surgeon: Pixie Casino, MD;  Location: [facility] CATH LAB;  Service: Cardiovascular;  Laterality: N/A;  .  left leg stent       Social History   Socioeconomic History  . Marital status: Single    Spouse name: Not on file  . Number of children: 3  . Years of education: 87  . Highest education level: Not on file  Occupational History  . Occupation: Disabled  Social Needs  . Financial resource strain: Not on file  . Food insecurity:    Worry: Not on file    Inability: Not on file  . Transportation needs:    Medical: Not on file    Non-medical: Not on file  Tobacco Use  . Smoking status: Never Smoker  . Smokeless tobacco: Never Used  Substance and Sexual Activity  . Alcohol use: No  . Drug use: No  . Sexual activity: Not on file  Lifestyle  . Physical activity:    Days per week: Not on file    Minutes per session: Not on file  . Stress: Not on file  Relationships  . Social connections:    Talks on phone: Not on file    Gets together: Not on file    Attends religious service: Not on file    Active member of club or organization: Not on file    Attends meetings of clubs or organizations: Not on file    Relationship status: Not on file  . Intimate partner violence:    Fear of current or ex partner: Not on file    Emotionally abused: Not on file    Physically abused: Not on file    Forced sexual activity: Not on file  Other Topics Concern  . Not on file  Social History Narrative   Independent and ambulatory with cane.   Lives at home alone.   Right-handed.   1-2 cups caffeine per day.    Family History  Problem Relation Age of  Onset  . Cancer Father   . Hypertension Mother   . Diabetes Mother   . Breast cancer Mother   . Hypertension Brother   . Diabetes Brother   . Hypertension Sister   . Diabetes Sister     ROS: no fevers or chills, productive cough, hemoptysis, dysphasia, odynophagia, melena, hematochezia, dysuria, hematuria, rash, seizure activity, orthopnea, PND, pedal edema, claudication. Remaining systems are negative.  Physical Exam: Well-developed obese in no acute distress.  Skin is warm and dry.  HEENT is normal.  Neck is supple.  Chest is clear to auscultation with normal expansion.  Cardiovascular exam is regular rate and rhythm.  Abdominal exam nontender or distended. No masses palpated. Extremities show no edema. neuro grossly intact  ECG-normal sinus rhythm at a rate of 85, nonspecific ST changes.  Personally reviewed  A/P  1 coronary artery disease-plan to continue medical therapy with aspirin and statin.  2 thoracic aortic aneurysm-last CT did not show aneurysm.  3 chest pain-No recent symptoms. Continue medical therapy.  4 chronic diastolic congestive heart failure-plan to continue present dose of Lasix as he appears to be euvolemic on examination.  Continue low-sodium diet and fluid restriction.    5 hypertension-patient's blood pressure is elevated; increase hydralazine to 75 mg p.o. 3 times daily and follow.  6 morbid obesity-we discussed the importance of weight loss.  He has lost 20 pounds by his report and he is exercising routinely.  7 carotid artery disease-mild on most recent carotid Dopplers.  8 dyspnea-likely multifactorial including chronic diastolic congestive heart failure, obesity hypoventilation syndrome and sleep apnea.  Kirk Ruths, MD

## 2018-07-19 ENCOUNTER — Encounter: Payer: Self-pay | Admitting: Cardiology

## 2018-07-19 ENCOUNTER — Ambulatory Visit (INDEPENDENT_AMBULATORY_CARE_PROVIDER_SITE_OTHER): Payer: Medicaid Other | Admitting: Cardiology

## 2018-07-19 VITALS — BP 148/70 | HR 85 | Ht 70.0 in

## 2018-07-19 DIAGNOSIS — I5032 Chronic diastolic (congestive) heart failure: Secondary | ICD-10-CM | POA: Diagnosis not present

## 2018-07-19 DIAGNOSIS — I2510 Atherosclerotic heart disease of native coronary artery without angina pectoris: Secondary | ICD-10-CM | POA: Diagnosis not present

## 2018-07-19 DIAGNOSIS — I10 Essential (primary) hypertension: Secondary | ICD-10-CM

## 2018-07-19 MED ORDER — HYDRALAZINE HCL 25 MG PO TABS: 75.0000 mg | ORAL_TABLET | Freq: Three times a day (TID) | ORAL | 3 refills | Status: DC

## 2018-07-19 NOTE — Patient Instructions (Signed)
Medication Instructions:   INCREASE HYDRALAZINE TO 75 MG THREE TIMES DAILY= 3 OF THE 25 MG TABLETS THREE TIMES DAILY  Follow-Up:  Your physician wants you to follow-up in: [place] will receive a reminder letter in the mail two months in advance. If you don't receive a letter, please call our office to schedule the follow-up appointment.   If you need a refill on your cardiac medications before your next appointment, please call your pharmacy.

## 2018-07-20 ENCOUNTER — Other Ambulatory Visit: Payer: Self-pay

## 2018-07-20 ENCOUNTER — Ambulatory Visit (HOSPITAL_COMMUNITY)
Admission: EM | Admit: 2018-07-20 | Discharge: 2018-07-20 | Disposition: A | Discharge status: Home / Self Care | Payer: Medicaid Other | Attending: Family Medicine | Admitting: Family Medicine

## 2018-07-20 ENCOUNTER — Encounter (HOSPITAL_COMMUNITY): Payer: Self-pay

## 2018-07-20 DIAGNOSIS — M1611 Unilateral primary osteoarthritis, right hip: Secondary | ICD-10-CM

## 2018-07-20 DIAGNOSIS — M25551 Pain in right hip: Secondary | ICD-10-CM

## 2018-07-20 DIAGNOSIS — M545 Low back pain, unspecified: Secondary | ICD-10-CM

## 2018-07-20 DIAGNOSIS — M51369 Other intervertebral disc degeneration, lumbar region without mention of lumbar back pain or lower extremity pain: Secondary | ICD-10-CM

## 2018-07-20 DIAGNOSIS — G8929 Other chronic pain: Secondary | ICD-10-CM

## 2018-07-20 DIAGNOSIS — M5136 Other intervertebral disc degeneration, lumbar region: Secondary | ICD-10-CM

## 2018-07-20 MED ORDER — KETOROLAC TROMETHAMINE 60 MG/2ML IM SOLN
60.0000 mg | Freq: Once | INTRAMUSCULAR | Status: AC
  Administered 2018-07-20: 60 mg via INTRAMUSCULAR

## 2018-07-20 MED ORDER — KETOROLAC TROMETHAMINE 60 MG/2ML IM SOLN
INTRAMUSCULAR | Status: AC
  Filled 2018-07-20: qty 2

## 2018-07-20 NOTE — ED Provider Notes (Signed)
MRN: [id number] DOB: 05-30-1961  Subjective:   [name] is a 57 y.o. male presenting for recurrent chronic low back pain and right hip pain for the past 2 days.  Patient has a history of degenerative disc disease, osteoarthritis of right hip.  He sees to orthopedist and has received steroid injections, has been advised that he needs surgery but has not yet been approved for this.  He has his chronic pain managed by [facility].  Takes oxycodone regularly for his chronic pain.  He also uses tizanidine as a muscle relaxant.  Currently he states that his back pain is pretty debilitating and has a hard time working.  His pain is also interrupting his sleep but gets good relief with this from using tizanidine.  He has a history of peptic ulcer disease from using NSAIDs and so has been advised to not use these.  However, he has used Toradol injections without any issues.  He does have uncontrolled diabetes, hypertension.  Reports that he went to his cardiologist yesterday and was told that his heart is healthy.  Denies falls, trauma, weakness, incontinence, difficulty urinating.  reports that he has never smoked. He has never used smokeless tobacco. He reports that he does not drink alcohol or use drugs.   No current facility-administered medications for this encounter.   Current Outpatient Medications:  .  aspirin 81 MG chewable tablet, Chew 1 tablet (81 mg total) by mouth daily., Disp: 30 tablet, Rfl: 0 .  atorvastatin (LIPITOR) 80 MG tablet, Take 1 tablet (80 mg total) by mouth daily. (Patient taking differently: Take 80 mg by mouth daily at 6 PM. ), Disp: 90 tablet, Rfl: 3 .  carvedilol (COREG) 25 MG tablet, Take 1 tablet (25 mg total) by mouth 2 (two) times daily with a meal., Disp: 180 tablet, Rfl: 3 .  diclofenac sodium (VOLTAREN) 1 % GEL, Apply 1 application topically 4 (four) times daily., Disp: 100 g, Rfl: 0 .  dicyclomine (BENTYL) 20 MG tablet, Take 1 tablet (20 mg total) by mouth 2  (two) times daily., Disp: 20 tablet, Rfl: 0 .  DULoxetine (CYMBALTA) 60 MG capsule, Take 1 capsule (60 mg total) by mouth daily., Disp: 30 capsule, Rfl: 8 .  famotidine (PEPCID) 40 MG tablet, Take 1 tablet (40 mg total) by mouth at bedtime. (Patient taking differently: Take 40 mg by mouth daily. ), Disp: 30 tablet, Rfl: 2 .  furosemide (LASIX) 40 MG tablet, Take 1 tablet (40 mg total) by mouth every evening. (Patient taking differently: Take 40 mg by mouth 3 (three) times daily. ), Disp: 30 tablet, Rfl:  .  furosemide (LASIX) 80 MG tablet, Take 1 tablet (80 mg total) by mouth daily., Disp: 30 tablet, Rfl:  .  glipiZIDE (GLUCOTROL) 10 MG tablet, Take 10 mg by mouth 2 (two) times daily., Disp: , Rfl: 4 .  hydrALAZINE (APRESOLINE) 25 MG tablet, Take 3 tablets (75 mg total) by mouth 3 (three) times daily., Disp: 540 tablet, Rfl: 3 .  Insulin Detemir (LEVEMIR) 100 UNIT/ML Pen, Inject 20 Units into the skin daily at 10 pm. (Patient taking differently: Inject 55 Units into the skin daily at 10 pm. ), Disp: 15 mL, Rfl: 3 .  Insulin Pen Needle 31G X 5 MM MISC, Use 1 needle daily to inject insulin as prescribed, Disp: 100 each, Rfl: 2 .  isosorbide dinitrate (ISORDIL) 30 MG tablet, Take 1 tablet (30 mg total) by mouth every morning., Disp: 30 tablet, Rfl: 0 .  isosorbide mononitrate (IMDUR) 30 MG 24 hr tablet, Take 30 mg by mouth daily., Disp: , Rfl: 2 .  lisinopril (PRINIVIL,ZESTRIL) 10 MG tablet, Take 1 tablet (10 mg total) by mouth daily. (Patient taking differently: Take 40 mg by mouth daily. ), Disp: 30 tablet, Rfl: 0 .  LYRICA 100 MG capsule, Take 100 mg by mouth 2 (two) times daily., Disp: , Rfl: 4 .  magnesium oxide (MAG-OX) 400 MG tablet, Take 400 mg by mouth daily., Disp: , Rfl: 0 .  metFORMIN (GLUCOPHAGE-XR) 500 MG 24 hr tablet, Take 2 tablets (1,000 mg total) by mouth 2 (two) times daily., Disp: , Rfl:  .  nitroGLYCERIN (NITROSTAT) 0.4 MG SL tablet, Place 0.4 mg under the tongue every 5 (five)  minutes as needed for chest pain., Disp: , Rfl: 4 .  oxyCODONE (ROXICODONE) 15 MG immediate release tablet, Take 15 mg 3 (three) times daily as needed by mouth for pain. , Disp: , Rfl: 0 .  oxyCODONE-acetaminophen (PERCOCET/ROXICET) 5-325 MG tablet, Take 1-2 tablets by mouth every 6 (six) hours as needed for moderate pain or severe pain., Disp: 15 tablet, Rfl: 0 .  pantoprazole (PROTONIX) 40 MG tablet, Take 1 tablet (40 mg total) by mouth 2 (two) times daily., Disp: 60 tablet, Rfl: 1 .  potassium chloride (MICRO-K) 10 MEQ CR capsule, Take 20 mEq by mouth 3 (three) times daily with meals. 20 meq at lunch, 10 meq at dinner and 10 meq at bedtime, Disp: , Rfl: 0 .  potassium chloride SA (K-DUR,KLOR-CON) 20 MEQ tablet, Take 20-40 mEq by mouth daily. 40 meq in the lunch, 20 med at dinner and 20 meq at bedtime, Disp: , Rfl:  .  sertraline (ZOLOFT) 50 MG tablet, Take 50 mg by mouth daily., Disp: , Rfl:  .  sitaGLIPtin (JANUVIA) 100 MG tablet, Take 100 mg by mouth daily., Disp: , Rfl:  .  tiZANidine (ZANAFLEX) 2 MG tablet, Take 2 mg by mouth 2 (two) times daily as needed for pain., Disp: , Rfl:  .  traZODone (DESYREL) 50 MG tablet, Take 1 tablet (50 mg total) by mouth at bedtime., Disp: 30 tablet, Rfl: 11   Allergies  Allergen Reactions  . Coconut Flavor [Flavoring Agent] Hives  . Coconut Oil Hives  . Ibuprofen Other (See Comments)    Made gastric ulcers worse  . Aleve [Naproxen] Other (See Comments)    DUE TO KIDNEYS  . Nsaids Other (See Comments)    Stomach ulcers     Past Medical History:  Diagnosis Date  . Anemia   . Arthritis   . Back pain   . CAD (coronary artery disease)    a. s/p DES to LAD in 05/2016  . Cervical radiculopathy   . Chronic diastolic CHF (congestive heart failure) ([place])   . Chronic pain   . Depression   . DVT (deep venous thrombosis) ([place])   . Hematemesis   . Hepatic steatosis   . Hyperlipidemia   . Hypertension   . IBS (irritable bowel syndrome)   . Morbid  obesity ([place])   . OSA (obstructive sleep apnea)   . Pancreatitis   . PE (pulmonary thromboembolism) ([place])   . Peripheral neuropathy   . PUD (peptic ulcer disease)   . Renal disorder   . Stroke [facility])    a. ?details unclear - not seen on imaging when he was admitted in 05/2017 for TIA symptoms which were felt due to cervical radiculopathy.  . Thoracic aortic ectasia ([place])  a. 4.3cm ectatic ascending thoracic aorta by CT 06/2017.   . Type 2 diabetes mellitus ([place])      Past Surgical History:  Procedure Laterality Date  . CARDIAC CATHETERIZATION N/A 05/31/2016   Procedure: Left Heart Cath and Coronary Angiography;  Surgeon: Peter M Martinique, MD;  Location: [place] CV LAB;  Service: Cardiovascular;  Laterality: N/A;  . CARDIAC CATHETERIZATION N/A 05/31/2016   Procedure: Intravascular Pressure Wire/FFR Study;  Surgeon: Peter M Martinique, MD;  Location: [place] CV LAB;  Service: Cardiovascular;  Laterality: N/A;  . CARDIAC CATHETERIZATION N/A 05/31/2016   Procedure: Coronary Stent Intervention;  Surgeon: Peter M Martinique, MD;  Location: [place] CV LAB;  Service: Cardiovascular;  Laterality: N/A;  . LEFT HEART CATH AND CORONARY ANGIOGRAPHY N/A 12/08/2017   Procedure: LEFT HEART CATH AND CORONARY ANGIOGRAPHY;  Surgeon: Leonie Man, MD;  Location: [place] CV LAB;  Service: Cardiovascular;  Laterality: N/A;  . LEFT HEART CATHETERIZATION WITH CORONARY ANGIOGRAM N/A 02/03/2014   Procedure: LEFT HEART CATHETERIZATION WITH CORONARY ANGIOGRAM;  Surgeon: Pixie Casino, MD;  Location: [facility] CATH LAB;  Service: Cardiovascular;  Laterality: N/A;  . left leg stent       Objective:   Vitals: BP (!) 154/84 (BP Location: Right Arm)   Pulse 86   Temp 98.4 F (36.9 C) (Oral)   Resp 20   Wt 299 lb (135.6 kg)   SpO2 96%   BMI 42.90 kg/m   BP Readings from Last 3 Encounters:  07/20/18 (!) 154/84  07/19/18 (!) 148/70  07/03/18 (!) 100/56    Physical Exam  Constitutional: He is oriented to  person, place, and time. He appears well-developed and well-nourished.  Cardiovascular: Normal rate.  Pulmonary/Chest: Effort normal.  Musculoskeletal:       Right hip: He exhibits decreased range of motion and decreased strength (secondary to pain). He exhibits no swelling, no crepitus and no deformity.       Lumbar back: He exhibits decreased range of motion, tenderness and spasm. He exhibits no swelling, no edema and no deformity.       Back:  Neurological: He is alert and oriented to person, place, and time. Coordination (moving very gingerly favoring his back) abnormal.    Assessment and Plan :   Right hip pain  Acute bilateral low back pain without sciatica  Chronic low back pain without sciatica, unspecified back pain laterality  Degenerative disc disease, lumbar  Osteoarthritis of right hip, unspecified osteoarthritis type  Patient needs to have surgery but is not currently a good candidate and is working with his orthopedist for this.  He has uncontrolled diabetes and recently received a steroid injection and therefore another steroid course is not viable.  He already takes oxycodone and tizanidine for his chronic back pain and has up-to-date scripts for this.  His primary goal was to obtain a Toradol injection as this provides patient good relief and has not had issues with it given his history of PUD with NSAIDs. Counseled patient on potential for adverse effects with medications prescribed today, patient verbalized understanding.  Otherwise patient is to follow-up with his orthopedist, chronic pain provider.   Jaynee Eagles, PA-C 07/20/18 1108

## 2018-07-20 NOTE — ED Triage Notes (Signed)
Pt states he has back and right hip pain x 1 day.

## 2018-08-11 ENCOUNTER — Encounter (HOSPITAL_COMMUNITY): Payer: Self-pay

## 2018-08-11 ENCOUNTER — Ambulatory Visit (HOSPITAL_COMMUNITY)
Admission: EM | Admit: 2018-08-11 | Discharge: 2018-08-11 | Disposition: A | Discharge status: Home / Self Care | Payer: Medicaid Other | Attending: Family Medicine | Admitting: Family Medicine

## 2018-08-11 DIAGNOSIS — G8929 Other chronic pain: Secondary | ICD-10-CM

## 2018-08-11 DIAGNOSIS — M79604 Pain in right leg: Secondary | ICD-10-CM | POA: Diagnosis not present

## 2018-08-11 DIAGNOSIS — M25562 Pain in left knee: Secondary | ICD-10-CM | POA: Diagnosis not present

## 2018-08-11 DIAGNOSIS — M79602 Pain in left arm: Secondary | ICD-10-CM

## 2018-08-11 MED ORDER — KETOROLAC TROMETHAMINE 30 MG/ML IJ SOLN
INTRAMUSCULAR | Status: AC
  Filled 2018-08-11: qty 1

## 2018-08-11 MED ORDER — KETOROLAC TROMETHAMINE 30 MG/ML IJ SOLN
30.0000 mg | Freq: Once | INTRAMUSCULAR | Status: AC
  Administered 2018-08-11: 30 mg via INTRAMUSCULAR

## 2018-08-11 NOTE — ED Provider Notes (Signed)
[place]    CSN: [id number] Arrival date & time: 08/11/18  1627     History   Chief Complaint Chief Complaint  Patient presents with  . Hip Pain  . Hand Pain  . Knee Pain    HPI [name] is a 57 y.o. male.   Pt c/o he fell today injured left leg and arm and right hip pain and pr states he was walking around in his house and his right leg gave away and he fell. Patient does not believe he broke anything.  He is moving his left leg and left arm normally and there is no significant or observable swelling.  He also lacks any ecchymosis or point tenderness.    Note from recent visit(07/20/18) here when he rec'd Toradol injection: [name] is a 57 y.o. male presenting for recurrent chronic low back pain and right hip pain for the past 2 days.  Patient has a history of degenerative disc disease, osteoarthritis of right hip.  He sees to orthopedist and has received steroid injections, has been advised that he needs surgery but has not yet been approved for this.  He has his chronic pain managed by [facility] medical.  Takes oxycodone regularly for his chronic pain.  He also uses tizanidine as a muscle relaxant.  Currently he states that his back pain is pretty debilitating and has a hard time working.  His pain is also interrupting his sleep but gets good relief with this from using tizanidine.  He has a history of peptic ulcer disease from using NSAIDs and so has been advised to not use these.  However, he has used Toradol injections without any issues.  He does have uncontrolled diabetes, hypertension.  Reports that he went to his cardiologist yesterday and was told that his heart is healthy.  Denies falls, trauma, weakness, incontinence, difficulty urinating.  reports that he has never smoked. He has never used smokeless tobacco. He reports that he does not drink alcohol or use drugs.      Past Medical History:  Diagnosis Date  . Anemia   . Arthritis   . Back  pain   . CAD (coronary artery disease)    a. s/p DES to LAD in 05/2016  . Cervical radiculopathy   . Chronic diastolic CHF (congestive heart failure) ([place])   . Chronic pain   . Depression   . DVT (deep venous thrombosis) ([place])   . Hematemesis   . Hepatic steatosis   . Hyperlipidemia   . Hypertension   . IBS (irritable bowel syndrome)   . Morbid obesity ([place])   . OSA (obstructive sleep apnea)   . Pancreatitis   . PE (pulmonary thromboembolism) ([place])   . Peripheral neuropathy   . PUD (peptic ulcer disease)   . Renal disorder   . Stroke [facility])    a. ?details unclear - not seen on imaging when he was admitted in 05/2017 for TIA symptoms which were felt due to cervical radiculopathy.  . Thoracic aortic ectasia (HCC)    a. 4.3cm ectatic ascending thoracic aorta by CT 06/2017.   . Type 2 diabetes mellitus [facility])     Patient Active Problem List   Diagnosis Date Noted  . B12 deficiency 08/10/2017  . Persistent headaches 08/09/2017  . GERD (gastroesophageal reflux disease) 06/29/2017  . Left arm numbness   . Cerebral embolism with cerebral infarction 06/17/2017  . TIA (transient ischemic attack) 06/17/2017  . Chronic back pain 12/29/2016  .  Depression 12/15/2016  . Vitamin D deficiency 12/09/2016  . Right hip pain 12/07/2016  . Hypokalemia 12/07/2016  . Type 2 diabetes mellitus with vascular disease ([place]) 05/31/2016  . Normocytic normochromic anemia 05/31/2016  . Chest pain 05/31/2016  . CAD (coronary artery disease) 01/06/2016  . DVT (deep venous thrombosis) ([place]) 01/06/2016  . Lactic acidosis 05/28/2014  . Nonspecific chest pain 01/29/2014  . Uncontrolled secondary diabetes with peripheral neuropathy ([place]) 01/29/2014  . Obesity, Class III, BMI 40-49.9 (morbid obesity) ([place]) 01/29/2014  . Snoring 01/29/2014  . Dyslipidemia 01/29/2014  . HTN (hypertension) 01/29/2014  . Abnormal nuclear stress test 01/29/2014    Past Surgical History:  Procedure Laterality Date  . CARDIAC  CATHETERIZATION N/A 05/31/2016   Procedure: Left Heart Cath and Coronary Angiography;  Surgeon: Peter M Martinique, MD;  Location: [place] CV LAB;  Service: Cardiovascular;  Laterality: N/A;  . CARDIAC CATHETERIZATION N/A 05/31/2016   Procedure: Intravascular Pressure Wire/FFR Study;  Surgeon: Peter M Martinique, MD;  Location: [place] CV LAB;  Service: Cardiovascular;  Laterality: N/A;  . CARDIAC CATHETERIZATION N/A 05/31/2016   Procedure: Coronary Stent Intervention;  Surgeon: Peter M Martinique, MD;  Location: [place] CV LAB;  Service: Cardiovascular;  Laterality: N/A;  . LEFT HEART CATH AND CORONARY ANGIOGRAPHY N/A 12/08/2017   Procedure: LEFT HEART CATH AND CORONARY ANGIOGRAPHY;  Surgeon: Leonie Man, MD;  Location: [place] CV LAB;  Service: Cardiovascular;  Laterality: N/A;  . LEFT HEART CATHETERIZATION WITH CORONARY ANGIOGRAM N/A 02/03/2014   Procedure: LEFT HEART CATHETERIZATION WITH CORONARY ANGIOGRAM;  Surgeon: Pixie Casino, MD;  Location: [facility] CATH LAB;  Service: Cardiovascular;  Laterality: N/A;  . left leg stent          Home Medications    Prior to Admission medications   Medication Sig Start Date End Date Taking? Authorizing Provider  aspirin 81 MG chewable tablet Chew 1 tablet (81 mg total) by mouth daily. 06/01/16   Elwin Mocha, MD  atorvastatin (LIPITOR) 80 MG tablet Take 1 tablet (80 mg total) by mouth daily. Patient taking differently: Take 80 mg by mouth daily at 6 PM.  01/26/17   Ahmed Prima, Fransisco Hertz, PA-C  carvedilol (COREG) 25 MG tablet Take 1 tablet (25 mg total) by mouth 2 (two) times daily with a meal. 01/26/17   Strader, [place] M, PA-C  diclofenac sodium (VOLTAREN) 1 % GEL Apply 1 application topically 4 (four) times daily. 04/05/18   Melynda Ripple, MD  dicyclomine (BENTYL) 20 MG tablet Take 1 tablet (20 mg total) by mouth 2 (two) times daily. 06/06/18   Palumbo, April, MD  DULoxetine (CYMBALTA) 60 MG capsule Take 1 capsule (60 mg total) by mouth daily.  03/08/18   Marcial Pacas, MD  famotidine (PEPCID) 40 MG tablet Take 1 tablet (40 mg total) by mouth at bedtime. Patient taking differently: Take 40 mg by mouth daily.  06/18/17   Barton Dubois, MD  furosemide (LASIX) 40 MG tablet Take 1 tablet (40 mg total) by mouth every evening. Patient taking differently: Take 40 mg by mouth 3 (three) times daily.  12/09/17   Velvet Bathe, MD  furosemide (LASIX) 80 MG tablet Take 1 tablet (80 mg total) by mouth daily. 12/10/17   Velvet Bathe, MD  glipiZIDE (GLUCOTROL) 10 MG tablet Take 10 mg by mouth 2 (two) times daily. 05/30/17   [provider]  hydrALAZINE (APRESOLINE) 25 MG tablet Take 3 tablets (75 mg total) by mouth 3 (three) times daily. 07/19/18  Lelon Perla, MD  Insulin Detemir (LEVEMIR) 100 UNIT/ML Pen Inject 20 Units into the skin daily at 10 pm. Patient taking differently: Inject 55 Units into the skin daily at 10 pm.  06/18/17   Barton Dubois, MD  Insulin Pen Needle 31G X 5 MM MISC Use 1 needle daily to inject insulin as prescribed 06/18/17   Barton Dubois, MD  isosorbide dinitrate (ISORDIL) 30 MG tablet Take 1 tablet (30 mg total) by mouth every morning. 12/10/17   Velvet Bathe, MD  isosorbide mononitrate (IMDUR) 30 MG 24 hr tablet Take 30 mg by mouth daily. 06/27/18   [provider]  lisinopril (PRINIVIL,ZESTRIL) 10 MG tablet Take 1 tablet (10 mg total) by mouth daily. Patient taking differently: Take 40 mg by mouth daily.  12/10/17   Velvet Bathe, MD  LYRICA 100 MG capsule Take 100 mg by mouth 2 (two) times daily. 11/29/17   [provider]  magnesium oxide (MAG-OX) 400 MG tablet Take 400 mg by mouth daily. 06/28/18   [provider]  metFORMIN (GLUCOPHAGE-XR) 500 MG 24 hr tablet Take 2 tablets (1,000 mg total) by mouth 2 (two) times daily. 07/02/17   Barton Dubois, MD  nitroGLYCERIN (NITROSTAT) 0.4 MG SL tablet Place 0.4 mg under the tongue every 5 (five) minutes as needed for chest pain. 12/17/16   [provider]  oxyCODONE (ROXICODONE) 15 MG immediate release tablet Take 15 mg 3 (three) times daily as needed by mouth for pain.  08/28/17   [provider]  oxyCODONE-acetaminophen (PERCOCET/ROXICET) 5-325 MG tablet Take 1-2 tablets by mouth every 6 (six) hours as needed for moderate pain or severe pain. 05/17/18   Duffy Bruce, MD  pantoprazole (PROTONIX) 40 MG tablet Take 1 tablet (40 mg total) by mouth 2 (two) times daily. 06/18/17   Barton Dubois, MD  potassium chloride (MICRO-K) 10 MEQ CR capsule Take 20 mEq by mouth 3 (three) times daily with meals. 20 meq at lunch, 10 meq at dinner and 10 meq at bedtime 06/28/18   [provider]  potassium chloride SA (K-DUR,KLOR-CON) 20 MEQ tablet Take 20-40 mEq by mouth daily. 40 meq in the lunch, 20 med at dinner and 20 meq at bedtime    [provider]  sertraline (ZOLOFT) 50 MG tablet Take 50 mg by mouth daily.    [provider]  sitaGLIPtin (JANUVIA) 100 MG tablet Take 100 mg by mouth daily.    [provider]  tiZANidine (ZANAFLEX) 2 MG tablet Take 2 mg by mouth 2 (two) times daily as needed for pain.    [provider]  traZODone (DESYREL) 50 MG tablet Take 1 tablet (50 mg total) by mouth at bedtime. 12/15/16   Florinda Marker, MD    Family History Family History  Problem Relation Age of Onset  . Cancer Father   . Hypertension Mother   . Diabetes Mother   . Breast cancer Mother   . Hypertension Brother   . Diabetes Brother   . Hypertension Sister   . Diabetes Sister     Social History Social History   Tobacco Use  . Smoking status: Never Smoker  . Smokeless tobacco: Never Used  Substance Use Topics  . Alcohol use: No  . Drug use: No     Allergies   Coconut flavor [flavoring agent]; Coconut oil; Ibuprofen; Aleve [naproxen]; and Nsaids   Review of Systems Review of Systems   Physical Exam Triage Vital Signs ED Triage Vitals  Enc  Vitals Group     BP      Pulse       Resp      Temp      Temp src      SpO2      Weight      Height      Head Circumference      Peak Flow      Pain Score      Pain Loc      Pain Edu?      Excl. in [place]?    No data found.  Updated Vital Signs BP (!) 157/70   Pulse 81   Temp 98.1 F (36.7 C) (Oral)   Resp 18   SpO2 98%   Physical Exam  Constitutional: He is oriented to person, place, and time. He appears well-developed and well-nourished. No distress.  Patient is seen in the exam room, in no distress whatsoever wearing headphones.  HENT:  Head: Normocephalic and atraumatic.  Right Ear: External ear normal.  Left Ear: External ear normal.  Mouth/Throat: Oropharynx is clear and moist.  Eyes: Conjunctivae are normal.  Neck: Normal range of motion. Neck supple.  Pulmonary/Chest: Effort normal.  Musculoskeletal: Normal range of motion. He exhibits no tenderness or deformity.  I palpated patient's back, left arm including elbow and wrist, left knee, and shoulder and found no localized tenderness, ecchymosis, or swelling.  Patient is moving normally in each of these joints.  Neurological: He is alert and oriented to person, place, and time.  Skin: Skin is warm and dry. He is not diaphoretic.  Patient has brawny edema with induration in both lower extremities up to the knees.  Nursing note and vitals reviewed.    UC Treatments / Results  Labs (all labs ordered are listed, but only abnormal results are displayed) Labs Reviewed - No data to display  EKG None  Radiology No results found.  Procedures Procedures (including critical care time)  Medications Ordered in UC Medications  ketorolac (TORADOL) 30 MG/ML injection 30 mg (has no administration in time range)    Initial Impression / Assessment and Plan / UC Course  I have reviewed the triage vital signs and the nursing notes.  Pertinent labs & imaging results that were available during my care of the patient were reviewed by me and considered in my  medical decision making (see chart for details).    Final Clinical Impressions(s) / UC Diagnoses   Final diagnoses:  Left arm pain  Acute pain of left knee  Chronic pain of right lower extremity     Discharge Instructions     Follow-up with your doctor if you develop any significant pain.  Apply ice to injured areas as needed.    ED Prescriptions    None     Controlled Substance Prescriptions [place] Controlled Substance Registry consulted? Not Applicable   Robyn Haber, MD 08/11/18 1733

## 2018-08-11 NOTE — ED Triage Notes (Signed)
Pt c/o he fell today injured left leg and arm. And right hip pain and pr states he was walking around in his house and his right leg gave away and he fell.

## 2018-08-11 NOTE — Discharge Instructions (Addendum)
Follow-up with your doctor if you develop any significant pain.  Apply ice to injured areas as needed.

## 2018-08-14 ENCOUNTER — Other Ambulatory Visit: Payer: Self-pay

## 2018-08-14 ENCOUNTER — Encounter (HOSPITAL_COMMUNITY): Payer: Self-pay

## 2018-08-14 ENCOUNTER — Emergency Department (HOSPITAL_COMMUNITY)
Admission: EM | Admit: 2018-08-14 | Discharge: 2018-08-14 | Disposition: A | Discharge status: Home / Self Care | Payer: Medicaid Other | Attending: Emergency Medicine | Admitting: Emergency Medicine

## 2018-08-14 DIAGNOSIS — Z794 Long term (current) use of insulin: Secondary | ICD-10-CM | POA: Diagnosis not present

## 2018-08-14 DIAGNOSIS — I5032 Chronic diastolic (congestive) heart failure: Secondary | ICD-10-CM | POA: Diagnosis not present

## 2018-08-14 DIAGNOSIS — M79602 Pain in left arm: Secondary | ICD-10-CM | POA: Insufficient documentation

## 2018-08-14 DIAGNOSIS — Z7982 Long term (current) use of aspirin: Secondary | ICD-10-CM | POA: Diagnosis not present

## 2018-08-14 DIAGNOSIS — E119 Type 2 diabetes mellitus without complications: Secondary | ICD-10-CM | POA: Diagnosis not present

## 2018-08-14 DIAGNOSIS — I110 Hypertensive heart disease with heart failure: Secondary | ICD-10-CM | POA: Insufficient documentation

## 2018-08-14 DIAGNOSIS — Z79899 Other long term (current) drug therapy: Secondary | ICD-10-CM | POA: Insufficient documentation

## 2018-08-14 DIAGNOSIS — M5412 Radiculopathy, cervical region: Secondary | ICD-10-CM | POA: Diagnosis not present

## 2018-08-14 MED ORDER — KETOROLAC TROMETHAMINE 30 MG/ML IJ SOLN
30.0000 mg | Freq: Once | INTRAMUSCULAR | Status: AC
  Administered 2018-08-14: 30 mg via INTRAMUSCULAR
  Filled 2018-08-14: qty 1

## 2018-08-14 NOTE — ED Triage Notes (Signed)
Pt states he has had decreased use of the left arm. He reports a fall 2 days ago. Pt states intermittent numbness of the extremity.

## 2018-08-14 NOTE — Discharge Instructions (Signed)
Follow-up with your spine specialist for further evaluation. Return to ED for worsening symptoms, injuries or falls, fever, chest pain, shortness of breath.

## 2018-08-14 NOTE — ED Provider Notes (Signed)
Patient placed in Quick Look pathway, seen and evaluated   Chief Complaint: Left arm pain  HPI:   [name] is a 57 y.o. male presents to the emergency department for evaluation of with some intermittent numbness and tingling.  He did fall 2 days ago but reports he has been having issues with this arm for longer than that, was seen last month for similar issues with numbness and weakness and had a MR of the brain and cervical spine done as well as other imaging which did show some central disc protrusions at C4 and C5.  Patient reports pain seems to be improving at this point he is not experiencing any deficits currently.  No significant change in his symptoms since the fall.  No severe neck pain also bowel or bladder control numbness or tingling in his lower extremities or right arm.  ROS: + Arm pain, numbness, paresthesia. -Neck pain, headache, loss of bowel or bladder control, lower extremity pain or weakness Physical Exam:   Gen: No distress  Neuro: Awake and Alert  Skin: Warm    Focused Exam: No tenderness to palpation over the cervical spine.  Normal cervical range of motion.  There is some mild tenderness throughout the left upper extremity with no overlying swelling, erythema or warmth, normal range of motion, 5/5 strength and sensation is intact.  Chart review reveals cervical MRI 06/29/2018 which showed some central disc protrusions but normal cord signal.   Initiation of care has begun. The patient has been counseled on the process, plan, and necessity for staying for the completion/evaluation, and the remainder of the medical screening examination    [name] 08/14/18 1510    Mesner, Corene Cornea, MD 08/15/18 0000

## 2018-08-14 NOTE — ED Provider Notes (Signed)
[facility] EMERGENCY DEPARTMENT Provider Note   CSN: [id number] Arrival date & time: 08/14/18  1446     History   Chief Complaint Chief Complaint  Patient presents with  . Arm Pain    HPI [name] is a 57 y.o. male past medical history of CAD, cervical radiculopathy, CHF, chronic pain, diabetes who presents to ED for left arm pain.  He has had symptoms on and off for the past several months.  The specific episode worsened in the past 4 days.  Describes it as an aching sensation, sharp shooting pain radiating from his neck down to his arm.  He is established and following up with a spine specialist.  States that he takes Roxicodone at home with improvement in his symptoms.  Denies any injuries or falls, numbness in arms or legs, chest pain. Chart review reveals cervical MRI 06/29/2018 which showed some central disc protrusions but normal cord signal.  HPI  Past Medical History:  Diagnosis Date  . Anemia   . Arthritis   . Back pain   . CAD (coronary artery disease)    a. s/p DES to LAD in 05/2016  . Cervical radiculopathy   . Chronic diastolic CHF (congestive heart failure) ([place])   . Chronic pain   . Depression   . DVT (deep venous thrombosis) ([place])   . Hematemesis   . Hepatic steatosis   . Hyperlipidemia   . Hypertension   . IBS (irritable bowel syndrome)   . Morbid obesity ([place])   . OSA (obstructive sleep apnea)   . Pancreatitis   . PE (pulmonary thromboembolism) ([place])   . Peripheral neuropathy   . PUD (peptic ulcer disease)   . Renal disorder   . Stroke [facility])    a. ?details unclear - not seen on imaging when he was admitted in 05/2017 for TIA symptoms which were felt due to cervical radiculopathy.  . Thoracic aortic ectasia (HCC)    a. 4.3cm ectatic ascending thoracic aorta by CT 06/2017.   . Type 2 diabetes mellitus [facility])     Patient Active Problem List   Diagnosis Date Noted  . B12 deficiency 08/10/2017  . Persistent headaches  08/09/2017  . GERD (gastroesophageal reflux disease) 06/29/2017  . Left arm numbness   . Cerebral embolism with cerebral infarction 06/17/2017  . TIA (transient ischemic attack) 06/17/2017  . Chronic back pain 12/29/2016  . Depression 12/15/2016  . Vitamin D deficiency 12/09/2016  . Right hip pain 12/07/2016  . Hypokalemia 12/07/2016  . Type 2 diabetes mellitus with vascular disease ([place]) 05/31/2016  . Normocytic normochromic anemia 05/31/2016  . Chest pain 05/31/2016  . CAD (coronary artery disease) 01/06/2016  . DVT (deep venous thrombosis) ([place]) 01/06/2016  . Lactic acidosis 05/28/2014  . Nonspecific chest pain 01/29/2014  . Uncontrolled secondary diabetes with peripheral neuropathy ([place]) 01/29/2014  . Obesity, Class III, BMI 40-49.9 (morbid obesity) ([place]) 01/29/2014  . Snoring 01/29/2014  . Dyslipidemia 01/29/2014  . HTN (hypertension) 01/29/2014  . Abnormal nuclear stress test 01/29/2014    Past Surgical History:  Procedure Laterality Date  . CARDIAC CATHETERIZATION N/A 05/31/2016   Procedure: Left Heart Cath and Coronary Angiography;  Surgeon: Peter M Martinique, MD;  Location: [facility] CV LAB;  Service: Cardiovascular;  Laterality: N/A;  . CARDIAC CATHETERIZATION N/A 05/31/2016   Procedure: Intravascular Pressure Wire/FFR Study;  Surgeon: Peter M Martinique, MD;  Location: [facility] CV LAB;  Service: Cardiovascular;  Laterality: N/A;  . CARDIAC CATHETERIZATION  N/A 05/31/2016   Procedure: Coronary Stent Intervention;  Surgeon: Peter M Martinique, MD;  Location: [facility] CV LAB;  Service: Cardiovascular;  Laterality: N/A;  . LEFT HEART CATH AND CORONARY ANGIOGRAPHY N/A 12/08/2017   Procedure: LEFT HEART CATH AND CORONARY ANGIOGRAPHY;  Surgeon: Leonie Man, MD;  Location: [facility] CV LAB;  Service: Cardiovascular;  Laterality: N/A;  . LEFT HEART CATHETERIZATION WITH CORONARY ANGIOGRAM N/A 02/03/2014   Procedure: LEFT HEART CATHETERIZATION WITH CORONARY ANGIOGRAM;  Surgeon: Pixie Casino, MD;  Location: [facility] CATH LAB;  Service: Cardiovascular;  Laterality: N/A;  . left leg stent           Home Medications    Prior to Admission medications   Medication Sig Start Date End Date Taking? Authorizing Provider  aspirin 81 MG chewable tablet Chew 1 tablet (81 mg total) by mouth daily. 06/01/16   Elwin Mocha, MD  atorvastatin (LIPITOR) 80 MG tablet Take 1 tablet (80 mg total) by mouth daily. Patient taking differently: Take 80 mg by mouth daily at 6 PM.  01/26/17   Ahmed Prima, Fransisco Hertz, PA-C  carvedilol (COREG) 25 MG tablet Take 1 tablet (25 mg total) by mouth 2 (two) times daily with a meal. 01/26/17   Strader, [place] M, PA-C  diclofenac sodium (VOLTAREN) 1 % GEL Apply 1 application topically 4 (four) times daily. 04/05/18   Melynda Ripple, MD  dicyclomine (BENTYL) 20 MG tablet Take 1 tablet (20 mg total) by mouth 2 (two) times daily. 06/06/18   Palumbo, April, MD  DULoxetine (CYMBALTA) 60 MG capsule Take 1 capsule (60 mg total) by mouth daily. 03/08/18   Marcial Pacas, MD  famotidine (PEPCID) 40 MG tablet Take 1 tablet (40 mg total) by mouth at bedtime. Patient taking differently: Take 40 mg by mouth daily.  06/18/17   Barton Dubois, MD  furosemide (LASIX) 40 MG tablet Take 1 tablet (40 mg total) by mouth every evening. Patient taking differently: Take 40 mg by mouth 3 (three) times daily.  12/09/17   Velvet Bathe, MD  furosemide (LASIX) 80 MG tablet Take 1 tablet (80 mg total) by mouth daily. 12/10/17   Velvet Bathe, MD  glipiZIDE (GLUCOTROL) 10 MG tablet Take 10 mg by mouth 2 (two) times daily. 05/30/17   [provider]  hydrALAZINE (APRESOLINE) 25 MG tablet Take 3 tablets (75 mg total) by mouth 3 (three) times daily. 07/19/18   Lelon Perla, MD  Insulin Detemir (LEVEMIR) 100 UNIT/ML Pen Inject 20 Units into the skin daily at 10 pm. Patient taking differently: Inject 55 Units into the skin daily at 10 pm.  06/18/17   Barton Dubois, MD  Insulin Pen Needle 31G X 5  MM MISC Use 1 needle daily to inject insulin as prescribed 06/18/17   Barton Dubois, MD  isosorbide dinitrate (ISORDIL) 30 MG tablet Take 1 tablet (30 mg total) by mouth every morning. 12/10/17   Velvet Bathe, MD  isosorbide mononitrate (IMDUR) 30 MG 24 hr tablet Take 30 mg by mouth daily. 06/27/18   [provider]  lisinopril (PRINIVIL,ZESTRIL) 10 MG tablet Take 1 tablet (10 mg total) by mouth daily. Patient taking differently: Take 40 mg by mouth daily.  12/10/17   Velvet Bathe, MD  LYRICA 100 MG capsule Take 100 mg by mouth 2 (two) times daily. 11/29/17   [provider]  magnesium oxide (MAG-OX) 400 MG tablet Take 400 mg by mouth daily. 06/28/18   [provider]  metFORMIN (GLUCOPHAGE-XR) 500  MG 24 hr tablet Take 2 tablets (1,000 mg total) by mouth 2 (two) times daily. 07/02/17   Barton Dubois, MD  nitroGLYCERIN (NITROSTAT) 0.4 MG SL tablet Place 0.4 mg under the tongue every 5 (five) minutes as needed for chest pain. 12/17/16   [provider]  oxyCODONE (ROXICODONE) 15 MG immediate release tablet Take 15 mg 3 (three) times daily as needed by mouth for pain.  08/28/17   [provider]  oxyCODONE-acetaminophen (PERCOCET/ROXICET) 5-325 MG tablet Take 1-2 tablets by mouth every 6 (six) hours as needed for moderate pain or severe pain. 05/17/18   Duffy Bruce, MD  pantoprazole (PROTONIX) 40 MG tablet Take 1 tablet (40 mg total) by mouth 2 (two) times daily. 06/18/17   Barton Dubois, MD  potassium chloride (MICRO-K) 10 MEQ CR capsule Take 20 mEq by mouth 3 (three) times daily with meals. 20 meq at lunch, 10 meq at dinner and 10 meq at bedtime 06/28/18   [provider]  potassium chloride SA (K-DUR,KLOR-CON) 20 MEQ tablet Take 20-40 mEq by mouth daily. 40 meq in the lunch, 20 med at dinner and 20 meq at bedtime    [provider]  sertraline (ZOLOFT) 50 MG tablet Take 50 mg by mouth daily.    [provider]  sitaGLIPtin (JANUVIA) 100  MG tablet Take 100 mg by mouth daily.    [provider]  tiZANidine (ZANAFLEX) 2 MG tablet Take 2 mg by mouth 2 (two) times daily as needed for pain.    [provider]  traZODone (DESYREL) 50 MG tablet Take 1 tablet (50 mg total) by mouth at bedtime. 12/15/16   Florinda Marker, MD    Family History Family History  Problem Relation Age of Onset  . Cancer Father   . Hypertension Mother   . Diabetes Mother   . Breast cancer Mother   . Hypertension Brother   . Diabetes Brother   . Hypertension Sister   . Diabetes Sister     Social History Social History   Tobacco Use  . Smoking status: Never Smoker  . Smokeless tobacco: Never Used  Substance Use Topics  . Alcohol use: No  . Drug use: No     Allergies   Coconut flavor [flavoring agent]; Coconut oil; Ibuprofen; Aleve [naproxen]; and Nsaids   Review of Systems Review of Systems  Constitutional: Negative for chills and fever.  Gastrointestinal: Negative for vomiting.  Musculoskeletal: Positive for arthralgias and myalgias.  Neurological: Negative for weakness and numbness.     Physical Exam Updated Vital Signs BP (!) 112/51 (BP Location: Right Arm)   Pulse 73   Temp 99.1 F (37.3 C) (Oral)   Resp 18   SpO2 95%   Physical Exam  Constitutional: He appears well-developed and well-nourished. No distress.  HENT:  Head: Normocephalic and atraumatic.  Eyes: Conjunctivae and EOM are normal. No scleral icterus.  Neck: Normal range of motion.  Pulmonary/Chest: Effort normal. No respiratory distress.  Musculoskeletal:  No C, T or L-spine tenderness to palpation.  No tenderness palpation of the arm.  No overlying skin changes noted.  No edema, erythema or warmth of joints noted.  Equal grip strength bilaterally.  2+ radial pulse noted bilaterally.  Neurological: He is alert.  Skin: No rash noted. He is not diaphoretic.  Psychiatric: He has a normal mood and affect.  Nursing note and vitals  reviewed.    ED Treatments / Results  Labs (all labs ordered are listed, but only  abnormal results are displayed) Labs Reviewed - No data to display  EKG None  Radiology No results found.  Procedures Procedures (including critical care time)  Medications Ordered in ED Medications  ketorolac (TORADOL) 30 MG/ML injection 30 mg (30 mg Intramuscular Given 08/14/18 1552)     Initial Impression / Assessment and Plan / ED Course  I have reviewed the triage vital signs and the nursing notes.  Pertinent labs & imaging results that were available during my care of the patient were reviewed by me and considered in my medical decision making (see chart for details).     57 year old male presents for chronic left arm pain.  States that he has cervical radiculopathy which is followed by spine specialist for.  He takes oxycodone at home with improvement of his symptoms.  States that this episode again 4 days ago.  He is requesting anti-inflammatory shot which has helped him in the past.  On exam there are no neurological deficits noted.  Equal grip strength bilaterally.  Area is neurovascularly intact.  Denies any chest pain.  Denies any new injuries.  No skin changes that would concern me for infectious cause.  Suspect that symptoms are due to radiculopathy.  Will give Toradol, advised him to follow-up with spine specialist.  Advised to return to ED for any severe worsening symptoms.  Portions of this note were generated with [employer]. Dictation errors may occur despite best attempts at proofreading.   Final Clinical Impressions(s) / ED Diagnoses   Final diagnoses:  Left arm pain  Cervical radiculopathy    ED Discharge Orders    None       Delia Heady, PA-C 08/14/18 1634    Quintella Reichert, MD 08/15/18 [phone]

## 2018-09-12 DIAGNOSIS — R208 Other disturbances of skin sensation: Secondary | ICD-10-CM | POA: Insufficient documentation

## 2018-09-25 ENCOUNTER — Other Ambulatory Visit: Payer: Self-pay

## 2018-09-25 ENCOUNTER — Ambulatory Visit: Payer: Medicaid Other | Attending: Family Medicine | Admitting: Physical Therapy

## 2018-09-25 ENCOUNTER — Encounter: Payer: Self-pay | Admitting: Physical Therapy

## 2018-09-25 DIAGNOSIS — M545 Low back pain, unspecified: Secondary | ICD-10-CM

## 2018-09-25 DIAGNOSIS — G8929 Other chronic pain: Secondary | ICD-10-CM | POA: Diagnosis present

## 2018-09-25 DIAGNOSIS — M6281 Muscle weakness (generalized): Secondary | ICD-10-CM | POA: Diagnosis present

## 2018-09-25 DIAGNOSIS — R2681 Unsteadiness on feet: Secondary | ICD-10-CM | POA: Diagnosis present

## 2018-09-25 DIAGNOSIS — R2689 Other abnormalities of gait and mobility: Secondary | ICD-10-CM | POA: Diagnosis present

## 2018-09-25 NOTE — Therapy (Signed)
[place], [place], [address] Phone: [phone]   Fax:  [phone]  Physical Therapy Evaluation  Patient Details  Name: [name] MRN: [id number] Date of Birth: 20-Nov-1960 Referring Provider (PT): Lowella Fairy Jerilee Hoh, FNP-C   Encounter Date: 09/25/2018  PT End of Session - 09/25/18 1308    Visit Number  1    Number of Visits  4   eval + 3 treatment visits   Date for PT Re-Evaluation  10/23/18    Authorization Type  Medicaid    PT Start Time  0924    PT Stop Time  1006    PT Time Calculation (min)  42 min    Activity Tolerance  Patient tolerated treatment well    Behavior During Therapy  [facility] for tasks assessed/performed       Past Medical History:  Diagnosis Date  . Anemia   . Arthritis   . Back pain   . CAD (coronary artery disease)    a. s/p DES to LAD in 05/2016  . Cervical radiculopathy   . Chronic diastolic CHF (congestive heart failure) ([place])   . Chronic pain   . Depression   . DVT (deep venous thrombosis) ([place])   . Hematemesis   . Hepatic steatosis   . Hyperlipidemia   . Hypertension   . IBS (irritable bowel syndrome)   . Morbid obesity ([place])   . OSA (obstructive sleep apnea)   . Pancreatitis   . PE (pulmonary thromboembolism) ([place])   . Peripheral neuropathy   . PUD (peptic ulcer disease)   . Renal disorder   . Stroke [facility])    a. ?details unclear - not seen on imaging when he was admitted in 05/2017 for TIA symptoms which were felt due to cervical radiculopathy.  . Thoracic aortic ectasia (HCC)    a. 4.3cm ectatic ascending thoracic aorta by CT 06/2017.   . Type 2 diabetes mellitus ([place])     Past Surgical History:  Procedure Laterality Date  . CARDIAC CATHETERIZATION N/A 05/31/2016   Procedure: Left Heart Cath and Coronary Angiography;  Surgeon: Peter M Martinique, MD;  Location: [facility] CV LAB;  Service: Cardiovascular;  Laterality: N/A;  . CARDIAC CATHETERIZATION N/A  05/31/2016   Procedure: Intravascular Pressure Wire/FFR Study;  Surgeon: Peter M Martinique, MD;  Location: [facility] [place] CV LAB;  Service: Cardiovascular;  Laterality: N/A;  . CARDIAC CATHETERIZATION N/A 05/31/2016   Procedure: Coronary Stent Intervention;  Surgeon: Peter M Martinique, MD;  Location: [place] CV LAB;  Service: Cardiovascular;  Laterality: N/A;  . LEFT HEART CATH AND CORONARY ANGIOGRAPHY N/A 12/08/2017   Procedure: LEFT HEART CATH AND CORONARY ANGIOGRAPHY;  Surgeon: Leonie Man, MD;  Location: [place] CV LAB;  Service: Cardiovascular;  Laterality: N/A;  . LEFT HEART CATHETERIZATION WITH CORONARY ANGIOGRAM N/A 02/03/2014   Procedure: LEFT HEART CATHETERIZATION WITH CORONARY ANGIOGRAM;  Surgeon: Pixie Casino, MD;  Location: [facility] CATH LAB;  Service: Cardiovascular;  Laterality: N/A;  . left leg stent       There were no vitals filed for this visit.   Subjective Assessment - 09/25/18 0935    Subjective  Pt. reports 3 year history of insidious onset LBP. No specific mechanism of injury noted but associates with many years spent in physically demanding jobs including working for [employer]. Pt. also with right hip pain with estblished OA dx. this region but otherwise no radiating symptoms noted.    Pertinent  History  cardiac history, stents, cervical radiculopathy, CVA, chronic pain-in pain management    Limitations  Sitting;Standing;Walking;Lifting;House hold activities    How long can you sit comfortably?  20-25 minutes    How long can you stand comfortably?  25-20 minutes    How long can you walk comfortably?  30 minutes    Diagnostic tests  MRI per pt. report, X-rays    Patient Stated Goals  "Walk straight", "Run again", play with grandson    Currently in Pain?  Yes    Pain Score  6     Pain Location  Back    Pain Orientation  Right;Left;Lower    Pain Descriptors / Indicators  Aching;Dull;Sharp;Burning    Pain Type  Chronic pain    Pain Onset  More than a  month ago    Pain Frequency  Constant    Aggravating Factors   "everything"    Pain Relieving Factors  medication and exercise when able to perform    Effect of Pain on Daily Activities  limits positional tolerance for sitting, standing, ability lifting for chores         [facility] PT Assessment - 09/25/18 0001      Assessment   Medical Diagnosis  Back pain, lumbosacral pain    Referring Provider (PT)  Lowella Fairy Jerilee Hoh, FNP-C    Onset Date/Surgical Date  --   estimated 09/24/15 per report 3 year history   Hand Dominance  Right    Prior Therapy  --   PT early this year, 3 past episodes PT for back     Precautions   Precautions  None      Restrictions   Weight Bearing Restrictions  No      Balance Screen   Has the patient fallen in the past 6 months  Yes    How many times?  [place] residence    Living Arrangements  Alone    Type of [place] Access  --   no stairs   Home Equipment  --   reports has RW, cane, shower chair     Prior Function   Level of Independence  Independent with basic ADLs      Cognition   Overall Cognitive Status  Within Functional Limits for tasks assessed      Sensation   Additional Comments  "burning" reported in lower legs with history peripheral neuropathy otherwise LE dermatomal screen grossly intact      ROM / Strength   AROM / PROM / Strength  AROM;Strength      AROM   AROM Assessment Site  Hip;Lumbar    Right/Left Hip  Right   Left hip AROM grossly WFL   Right Hip Flexion  100    Right Hip External Rotation   30    Right Hip Internal Rotation   0    Right Hip ABduction  30    Lumbar Flexion  80    Lumbar Extension  20   increased LBP   Lumbar - Right Side Bend  20   increased hip pain   Lumbar - Left Side Bend  30   increased LBP   Lumbar - Right Rotation  50%    Lumbar - Left Rotation  70%      Strength   Strength Assessment Site  Hip;Knee;Ankle    Right/Left Hip   Right;Left  Right Hip Flexion  4/5    Right Hip Extension  4+/5    Right Hip External Rotation   5/5    Right Hip Internal Rotation  5/5   at neutral   Right Hip ABduction  4/5    Left Hip Flexion  5/5    Left Hip Extension  5/5    Left Hip External Rotation  5/5    Left Hip Internal Rotation  5/5    Left Hip ABduction  5/5    Right/Left Knee  Right;Left    Right Knee Flexion  5/5    Right Knee Extension  5/5    Left Knee Flexion  5/5    Left Knee Extension  5/5    Right/Left Ankle  Right;Left    Right Ankle Dorsiflexion  5/5    Right Ankle Inversion  5/5    Right Ankle Eversion  5/5    Left Ankle Dorsiflexion  5/5    Left Ankle Inversion  5/5    Left Ankle Eversion  5/5      Flexibility   Soft Tissue Assessment /Muscle Length  --   piriformis tightness bilat., left SLR 80 deg, right 70 deg     Palpation   Palpation comment  general tightness/tenderness to palpation bilat. lumbar paraspinals      Special Tests   Other special tests  R anterior innominate rotation noted                Objective measurements completed on examination: See above findings.      [facility] Adult PT Treatment/Exercise - 09/25/18 0001      Exercises   Exercises  Lumbar      Lumbar Exercises: Stretches   Passive Hamstring Stretch  --   instructed HEP seated stretch   Single Knee to Chest Stretch  --   instructed HEP with brief practice 2-3 reps bilat.   Pelvic Tilt  --   instructed HEP with brief practice x 5 reps   Piriformis Stretch  --   instructed HEP figure 4 stretch     Lumbar Exercises: Supine   Bridge  --   instructed HEP with brief practice x 5 reps            PT Education - 09/25/18 1007    Education Details  HEP, spine, SI anatomy, POC    Person(s) Educated  Patient    Methods  Explanation;Demonstration;Tactile cues;Verbal cues;Handout    Comprehension  Verbalized understanding;Returned demonstration          PT Long Term Goals - 09/25/18 1315       PT LONG TERM GOAL #1   Title  Independent with HEP    Baseline  no HEP    Time  4    Period  Weeks    Status  New    Target Date  10/23/18      PT LONG TERM GOAL #2   Title  Tolerate sitting 30 min or greater for eating meals, car travel    Baseline  20-25 min    Time  4    Period  Weeks    Status  New    Target Date  10/23/18      PT LONG TERM GOAL #3   Title  Tolerate walking 35-40 min for grocery shopping, IADLs    Baseline  30 min    Time  4    Period  Weeks    Status  New  Target Date  10/23/18      PT LONG TERM GOAL #5   Title  Tolerate walking             Plan - 09/25/18 1309    Clinical Impression Statement  Pt. presents with lumbosacral pain with flexion bias ROM which suspect is associated with underlying degenerative changes. Pt. also with SI region pain with right anterior innominate rotation noted. Pt. does present with hip pain but suspect this is associated with local pain from OA/not radicular in etiology. Pt. would benefit from PT to address current associated functional limitations.    History and Personal Factors relevant to plan of care:  underlying degenerative changes, cardiac + CVA history, chronic pain history, past PT efforts    Clinical Presentation  Stable    Clinical Decision Making  Low    Rehab Potential  Fair    Clinical Impairments Affecting Rehab Potential  chronic pain history, past therapy, comorbidities    PT Frequency  --   eval + 3 treatment visits   PT Duration  4 weeks    PT Treatment/Interventions  ADLs/Self Care Home Management;Cryotherapy;Electrical Stimulation;Ultrasound;Moist Heat;Traction;Neuromuscular re-education;Functional mobility training;Therapeutic activities;Therapeutic exercise;Patient/family education;Manual techniques;Dry needling    PT Next Visit Plan  review HEP as needed, check for innominate rotation (right anterior) and perform METs as needed to address, NUSTEP, pelvic tilts, SKTC, hip bridges, hip add.  isometrics, clamshells, hamstring and piriformis stretches, flexion bias core strengthening    PT Home Exercise Plan  pelvic tilts, SKTC, hip. add. isometrics, clamshell, hip bridges, hamstring and piriformis stretches    Consulted and Agree with Plan of Care  Patient       Patient will benefit from skilled therapeutic intervention in order to improve the following deficits and impairments:  Pain, Impaired sensation, Decreased activity tolerance, Decreased endurance, Decreased range of motion, Decreased strength, Obesity, Difficulty walking  Visit Diagnosis: Chronic bilateral low back pain without sciatica  Muscle weakness (generalized)     Problem List Patient Active Problem List   Diagnosis Date Noted  . B12 deficiency 08/10/2017  . Persistent headaches 08/09/2017  . GERD (gastroesophageal reflux disease) 06/29/2017  . Left arm numbness   . Cerebral embolism with cerebral infarction 06/17/2017  . TIA (transient ischemic attack) 06/17/2017  . Chronic back pain 12/29/2016  . Depression 12/15/2016  . Vitamin D deficiency 12/09/2016  . Right hip pain 12/07/2016  . Hypokalemia 12/07/2016  . Type 2 diabetes mellitus with vascular disease ([place]) 05/31/2016  . Normocytic normochromic anemia 05/31/2016  . Chest pain 05/31/2016  . CAD (coronary artery disease) 01/06/2016  . DVT (deep venous thrombosis) ([place]) 01/06/2016  . Lactic acidosis 05/28/2014  . Nonspecific chest pain 01/29/2014  . Uncontrolled secondary diabetes with peripheral neuropathy ([place]) 01/29/2014  . Obesity, Class III, BMI 40-49.9 (morbid obesity) ([place]) 01/29/2014  . Snoring 01/29/2014  . Dyslipidemia 01/29/2014  . HTN (hypertension) 01/29/2014  . Abnormal nuclear stress test 01/29/2014    Beaulah Dinning, PT, DPT 09/25/18 1:21 PM  [facility] [address] [place], [place], [address] Phone: [phone]   Fax:  [phone]  Name: CRISTIANO CAPRI MRN:  [id number] Date of Birth: [birth year]

## 2018-10-03 ENCOUNTER — Ambulatory Visit: Payer: Medicaid Other

## 2018-10-03 DIAGNOSIS — M545 Low back pain, unspecified: Secondary | ICD-10-CM

## 2018-10-03 DIAGNOSIS — R2689 Other abnormalities of gait and mobility: Secondary | ICD-10-CM

## 2018-10-03 DIAGNOSIS — G8929 Other chronic pain: Secondary | ICD-10-CM

## 2018-10-03 DIAGNOSIS — M6281 Muscle weakness (generalized): Secondary | ICD-10-CM

## 2018-10-03 DIAGNOSIS — R2681 Unsteadiness on feet: Secondary | ICD-10-CM

## 2018-10-03 NOTE — Therapy (Signed)
[place], [place], [address] Phone: [phone]   Fax:  [phone]  Physical Therapy Treatment  Patient Details  Name: [name] MRN: [id number] Date of Birth: 1961/05/29 Referring Provider (PT): Lowella Fairy Jerilee Hoh, FNP-C   Encounter Date: 10/03/2018  PT End of Session - 10/03/18 0848    Visit Number  2    Date for PT Re-Evaluation  10/23/18    Authorization Type  Medicaid    Authorization Time Period  through 10/15/18    Authorization - Visit Number  1    PT Start Time  0845    PT Stop Time  0945    PT Time Calculation (min)  60 min    Activity Tolerance  Patient tolerated treatment well;No increased pain    Behavior During Therapy  WFL for tasks assessed/performed       Past Medical History:  Diagnosis Date  . Anemia   . Arthritis   . Back pain   . CAD (coronary artery disease)    a. s/p DES to LAD in 05/2016  . Cervical radiculopathy   . Chronic diastolic CHF (congestive heart failure) ([place])   . Chronic pain   . Depression   . DVT (deep venous thrombosis) ([place])   . Hematemesis   . Hepatic steatosis   . Hyperlipidemia   . Hypertension   . IBS (irritable bowel syndrome)   . Morbid obesity ([place])   . OSA (obstructive sleep apnea)   . Pancreatitis   . PE (pulmonary thromboembolism) ([place])   . Peripheral neuropathy   . PUD (peptic ulcer disease)   . Renal disorder   . Stroke [facility])    a. ?details unclear - not seen on imaging when he was admitted in 05/2017 for TIA symptoms which were felt due to cervical radiculopathy.  . Thoracic aortic ectasia (HCC)    a. 4.3cm ectatic ascending thoracic aorta by CT 06/2017.   . Type 2 diabetes mellitus ([place])     Past Surgical History:  Procedure Laterality Date  . CARDIAC CATHETERIZATION N/A 05/31/2016   Procedure: Left Heart Cath and Coronary Angiography;  Surgeon: Peter M Martinique, MD;  Location: [place] CV LAB;  Service: Cardiovascular;   Laterality: N/A;  . CARDIAC CATHETERIZATION N/A 05/31/2016   Procedure: Intravascular Pressure Wire/FFR Study;  Surgeon: Peter M Martinique, MD;  Location: [place] CV [facility];  Service: Cardiovascular;  Laterality: N/A;  . CARDIAC CATHETERIZATION N/A 05/31/2016   Procedure: Coronary Stent Intervention;  Surgeon: Peter M Martinique, MD;  Location: [place] CV LAB;  Service: Cardiovascular;  Laterality: N/A;  . LEFT HEART CATH AND CORONARY ANGIOGRAPHY N/A 12/08/2017   Procedure: LEFT HEART CATH AND CORONARY ANGIOGRAPHY;  Surgeon: Leonie Man, MD;  Location: [place] [facility];  Service: Cardiovascular;  Laterality: N/A;  . LEFT HEART CATHETERIZATION WITH CORONARY ANGIOGRAM N/A 02/03/2014   Procedure: LEFT HEART CATHETERIZATION WITH CORONARY ANGIOGRAM;  Surgeon: Pixie Casino, MD;  Location: [facility] CATH LAB;  Service: Cardiovascular;  Laterality: N/A;  . left leg stent       There were no vitals filed for this visit.  Subjective Assessment - 10/03/18 0851    Subjective  Reports doing HEP    Pain Score  6     Pain Location  Back    Pain Orientation  Right;Left;Lower    Pain Descriptors / Indicators  Aching;Dull;Burning    Pain Type  Chronic pain    Pain Onset  More  than a month ago    Pain Frequency  Constant                       [facility] Adult PT Treatment/Exercise - 10/03/18 0001      Exercises   Exercises  Lumbar      Lumbar Exercises: Sidelying   Clam  Right;Left;10 reps    Clam Limitations  cued to keep body alighned.       Lumbar Exercises: Quadruped   [place]  10 reps    Other Quadruped Lumbar Exercises  childs pose movin but unable  so flexed and did back raises with abdomen      Manual Therapy   Manual Therapy  Passive ROM;Manual Traction;Soft tissue mobilization;Joint mobilization    Joint Mobilization  to lower thoracic to lower lumba Gr 4     Passive ROM  IR /ER each hip nad knee flexion all prone.     Manual Traction  long axix pulls to each leg x  100 reps       Added to HEp      PT Education - 10/03/18 0930    Education Details  HEP    Person(s) Educated  Patient    Methods  Explanation;Tactile cues;Verbal cues;Handout;Demonstration    Comprehension  Returned demonstration;Verbalized understanding      All HEP were reviewed and he needed  Cuing for 75% or them.     PT Long Term Goals - 09/25/18 1315      PT LONG TERM GOAL #1   Title  Independent with HEP    Baseline  no HEP    Time  4    Period  Weeks    Status  New    Target Date  10/23/18      PT LONG TERM GOAL #2   Title  Tolerate sitting 30 min or greater for eating meals, car travel    Baseline  20-25 min    Time  4    Period  Weeks    Status  New    Target Date  10/23/18      PT LONG TERM GOAL #3   Title  Tolerate walking 35-40 min for grocery shopping, IADLs    Baseline  30 min    Time  4    Period  Weeks    Status  New    Target Date  10/23/18      PT LONG TERM GOAL #5   Title  Tolerate walking            Plan - 10/03/18 0926    Clinical Impression Statement  Tolerated al session. Very stiff all over in spine /hips and thighs.   Did HEP correctly.   Continue manual and exercises    PT Treatment/Interventions  ADLs/Self Care Home Management;Cryotherapy;Electrical Stimulation;Ultrasound;Moist Heat;Traction;Neuromuscular re-education;Functional mobility training;Therapeutic activities;Therapeutic exercise;Patient/family education;Manual techniques;Dry needling    PT Next Visit Plan  Continue core strength and manual heat as needed    PT Home Exercise Plan  pelvic tilts, SKTC, hip. add. isometrics, clamshell, hip bridges, hamstring and piriformis stretches, clam and reverse clam, ab set in standing , prone hip ext with pillows.     Consulted and Agree with Plan of Care  Patient       Patient will benefit from skilled therapeutic intervention in order to improve the following deficits and impairments:  Pain, Impaired sensation, Decreased  activity tolerance, Decreased endurance, Decreased range of motion, Decreased strength,  Obesity, Difficulty walking  Visit Diagnosis: Chronic bilateral low back pain without sciatica  Muscle weakness (generalized)  Unsteadiness on feet  Other abnormalities of gait and mobility     Problem List Patient Active Problem List   Diagnosis Date Noted  . B12 deficiency 08/10/2017  . Persistent headaches 08/09/2017  . GERD (gastroesophageal reflux disease) 06/29/2017  . Left arm numbness   . Cerebral embolism with cerebral infarction 06/17/2017  . TIA (transient ischemic attack) 06/17/2017  . Chronic back pain 12/29/2016  . Depression 12/15/2016  . Vitamin D deficiency 12/09/2016  . Right hip pain 12/07/2016  . Hypokalemia 12/07/2016  . Type 2 diabetes mellitus with vascular disease ([place]) 05/31/2016  . Normocytic normochromic anemia 05/31/2016  . Chest pain 05/31/2016  . CAD (coronary artery disease) 01/06/2016  . DVT (deep venous thrombosis) ([place]) 01/06/2016  . Lactic acidosis 05/28/2014  . Nonspecific chest pain 01/29/2014  . Uncontrolled secondary diabetes with peripheral neuropathy ([place]) 01/29/2014  . Obesity, Class III, BMI 40-49.9 (morbid obesity) ([place]) 01/29/2014  . Snoring 01/29/2014  . Dyslipidemia 01/29/2014  . HTN (hypertension) 01/29/2014  . Abnormal nuclear stress test 01/29/2014    Darrel Hoover  PT 10/03/2018, 9:30 AM  [facility] [address] [place], [place], [address] Phone: [phone]   Fax:  [phone]  Name: [name] MRN: [id number] Date of Birth: 04-14-1961

## 2018-10-03 NOTE — Patient Instructions (Signed)
Clam    Lie on side, legs bent 90. Open top knee to ceiling, rotating leg outward. Touch toes to ankle of bottom leg. Close knees, rotating leg inward. Maintain hip position. Repeat _10-20___ times. Repeat on other side. Do _1Hip Extension (Prone)    Lift left leg _8-12___ inches from floor, keeping knee locked. Repeat _10-20___ times per set. Do _1___ sets per session. Do _1Pelvic Tilt: Posterior - Legs Bent (Supine)    Tighten stomach and flatten back by rolling pelvis down. Hold ____ seconds. Relax. Repeat ____ times per set. Do ____ sets per session. Do ____ sessions per day.  http://orth.exer.us/202   Copyright  VHI. All rights reserved.  Pelvic Tilt: Posterior (Standing)    With knees slightly bent, tighten stomach and flatten back by rolling pelvis down. Hold __5__ seconds. Relax. Repeat __2-10__ times per set. Do __1__ sets per session. Do __4-5__ sessions per day.  http://orth.exer.us/206   Copyright  VHI. All rights reserved.  ___ sessions per day.  http://orth.exer.us/98   Copyright  VHI. All rights reserved.  ___ sessions per day.  http://pm.exer.us/68   Copyright  VHI. All rights reserved.

## 2018-10-09 ENCOUNTER — Encounter: Payer: Self-pay | Admitting: Neurology

## 2018-10-09 ENCOUNTER — Ambulatory Visit: Payer: Medicaid Other | Admitting: Neurology

## 2018-10-09 VITALS — BP 122/67 | HR 96 | Ht 70.0 in | Wt 304.0 lb

## 2018-10-09 DIAGNOSIS — R29898 Other symptoms and signs involving the musculoskeletal system: Secondary | ICD-10-CM | POA: Diagnosis not present

## 2018-10-09 DIAGNOSIS — M542 Cervicalgia: Secondary | ICD-10-CM | POA: Diagnosis not present

## 2018-10-09 MED ORDER — LYRICA 100 MG PO CAPS: 100.0000 mg | ORAL_CAPSULE | Freq: Two times a day (BID) | ORAL | 4 refills | Status: DC

## 2018-10-09 NOTE — Progress Notes (Signed)
PATIENT: [name] DOB: Mar 03, 1961  Chief Complaint  Patient presents with  . Left-sided weakness    Reports left-sided weakness since August 2019.  It started without injury and his symptoms have become progressively worse.  He is having more difficulty walking.  Marland Kitchen PCP    [name], FNP     HISTORICAL  [name]      PATIENT: [name] DOB: 1961-08-23  Chief Complaint  Patient presents with  . Left-sided weakness    Reports left-sided weakness since August 2019.  It started without injury and his symptoms have become progressively become worse.  He is having more difficulty walking.  Marland Kitchen PCP    [name], FNP     HISTORICAL  [name] is a 57 year old male, seen in refer by his primary care doctor  [name] for evaluation of headaches, initial evaluation was on August 09 2017. He came in with case worker.  I reviewed and summarized in note, he has history of hyperlipidemia, diabetes since 2012, vitamin D deficiency, obesity, coronary artery disease, stroke in August 2017, He presented with blurred vision, could not breath, pancreatitis,   He began to have headaches since 2017, starting from occipital region, pressure sensation, spreading forward to become bilateral frontal headaches, mild blurry vision, pressure pain, radiating his ear, he denies significant light noise sensitivity, goes all day long, take NSAIDs, muscle relaxant, which has been helpful,  He also complains of chronic neck pain, multiple joints pain, low back pain, hip pain, radiating pain to bilateral upper extremity, and hands, paresthesia,  Laboratory evaluations in June 2018, CMP, creatinine of 0.62, CBC hemoglobin of 12.5, A1c was elevated 9.4,  Personally reviewed MRI of the brain June 17 2017, no acute abnormality.  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, due to disc protrusion, posterior longitudinal ligament ossification, no cord signal  change, no significant foraminal narrowing  CT angiogram of the chest, no evidence of pulmonary emboli, stable ectatic ascending thoracic aorta are   Labs, September 2018, UDS was positive for opiates, CBC hemoglobin of 11.3, normal BNP, negative troponin, lipase was 32, lipid profile, LDL was 2, triglyceride 255,     CT angiogram of the head and neck showed no large vessel disease, evidence of intracranial atherosclerotic disease  UPDATE Nov 06 2017: He still has right arm and leg numbness, weakness, occasionally left hand numbness, no gait difficulty, he has true history of obesity, is using CPAP machine  Reviewed laboratory evaluations, CMP, creatinine 0.62, hemoglobin of 12 vitamin D 25, A1c was elevated 9.4,  He had a sudden onset acute worsening of neck pain December 2018, was evaluated at emergency room, had a repeat CT of cervical spine, multilevel degenerative changes, spinal stenosis, there was no significant spinal cord signal change.  He has gait abnormality due to knee pain, no bowel bladder incontinence  UPDATE Oct 09 2018: I reviewed multiple ED presentation, June 10, 2018, acute left neck pain, July 03, 2018, left facial numbness, transient blurred vision, left-sided headaches, July 20, 2018, recurrent chronic low back pain, right hip pain, August 14, 2018, numbness tingling,  CT Angiogram of head and neck were normal.   He now complains of left-sided weakness, neck pain, radiating pain to left shoulder, left arm,  We personally reviewed MRI of cervical spine September 2019, no significant change, central disc protrusion at C4-5, C5-6, with mild cord flattening, no cord signal changes, moderate foraminal narrowing,  REVIEW OF SYSTEMS: Full 14 system review of systems performed and notable only for appetite change, fatigue, excessive sweating, thirst, abdominal pain, diarrhea, nausea, chest pain, leg swelling, memory loss, frequent urination, joint pain  aching muscles walking difficulty neck pain, stiffness, depression, weakness,  All rest review of the system were negative  ALLERGIES: Allergies  Allergen Reactions  . Coconut Flavor [Flavoring Agent] Hives  . Coconut Oil Hives  . Ibuprofen Other (See Comments)    Made gastric ulcers worse  . Aleve [Naproxen] Other (See Comments)    DUE TO KIDNEYS  . Nsaids Other (See Comments)    Stomach ulcers     HOME MEDICATIONS: Current Outpatient Medications  Medication Sig Dispense Refill  . aspirin 81 MG chewable tablet Chew 1 tablet (81 mg total) by mouth daily. 30 tablet 0  . atorvastatin (LIPITOR) 80 MG tablet Take 1 tablet (80 mg total) by mouth daily. (Patient taking differently: Take 80 mg by mouth daily at 6 PM. ) 90 tablet 3  . carvedilol (COREG) 25 MG tablet Take 1 tablet (25 mg total) by mouth 2 (two) times daily with a meal. 180 tablet 3  . diclofenac sodium (VOLTAREN) 1 % GEL Apply 1 application topically 4 (four) times daily. (Patient not taking: Reported on 09/25/2018) 100 g 0  . dicyclomine (BENTYL) 20 MG tablet Take 1 tablet (20 mg total) by mouth 2 (two) times daily. (Patient not taking: Reported on 09/25/2018) 20 tablet 0  . DULoxetine (CYMBALTA) 60 MG capsule Take 1 capsule (60 mg total) by mouth daily. 30 capsule 8  . famotidine (PEPCID) 40 MG tablet Take 1 tablet (40 mg total) by mouth at bedtime. (Patient taking differently: Take 40 mg by mouth daily. ) 30 tablet 2  . furosemide (LASIX) 40 MG tablet Take 1 tablet (40 mg total) by mouth every evening. (Patient taking differently: Take 40 mg by mouth 3 (three) times daily. ) 30 tablet   . glipiZIDE (GLUCOTROL) 10 MG tablet Take 10 mg by mouth 2 (two) times daily.  4  . hydrALAZINE (APRESOLINE) 25 MG tablet Take 3 tablets (75 mg total) by mouth 3 (three) times daily. 540 tablet 3  . Insulin Detemir (LEVEMIR) 100 UNIT/ML Pen Inject 20 Units into the skin daily at 10 pm. (Patient taking differently: Inject 55 Units into the skin  daily at 10 pm. ) 15 mL 3  . Insulin Pen Needle 31G X 5 MM MISC Use 1 needle daily to inject insulin as prescribed 100 each 2  . isosorbide dinitrate (ISORDIL) 30 MG tablet Take 1 tablet (30 mg total) by mouth every morning. 30 tablet 0  . isosorbide mononitrate (IMDUR) 30 MG 24 hr tablet Take 30 mg by mouth daily.  2  . lisinopril (PRINIVIL,ZESTRIL) 10 MG tablet Take 1 tablet (10 mg total) by mouth daily. (Patient taking differently: Take 40 mg by mouth daily. ) 30 tablet 0  . LYRICA 100 MG capsule Take 100 mg by mouth 2 (two) times daily.  4  . metFORMIN (GLUCOPHAGE-XR) 500 MG 24 hr tablet Take 2 tablets (1,000 mg total) by mouth 2 (two) times daily.    . nitroGLYCERIN (NITROSTAT) 0.4 MG SL tablet Place 0.4 mg under the tongue every 5 (five) minutes as needed for chest pain.  4  . oxyCODONE (ROXICODONE) 15 MG immediate release tablet Take 15 mg 3 (three) times daily as needed by mouth for pain.   0  . pantoprazole (PROTONIX) 40 MG tablet Take 1 tablet (40  mg total) by mouth 2 (two) times daily. 60 tablet 1  . potassium chloride (MICRO-K) 10 MEQ CR capsule Take 20 mEq by mouth 3 (three) times daily with meals. 20 meq at lunch, 10 meq at dinner and 10 meq at bedtime  0  . potassium chloride SA (K-DUR,KLOR-CON) 20 MEQ tablet Take 20-40 mEq by mouth daily. 40 meq in the lunch, 20 med at dinner and 20 meq at bedtime    . sertraline (ZOLOFT) 50 MG tablet Take 50 mg by mouth daily.    . sitaGLIPtin (JANUVIA) 100 MG tablet Take 100 mg by mouth daily.    Marland Kitchen tiZANidine (ZANAFLEX) 2 MG tablet Take 2 mg by mouth 2 (two) times daily as needed for pain.    . traZODone (DESYREL) 50 MG tablet Take 1 tablet (50 mg total) by mouth at bedtime. 30 tablet 11   No current facility-administered medications for this visit.     PAST MEDICAL HISTORY: Past Medical History:  Diagnosis Date  . Anemia   . Arthritis   . Back pain   . CAD (coronary artery disease)    a. s/p DES to LAD in 05/2016  . Cervical  radiculopathy   . Chronic diastolic CHF (congestive heart failure) ([place])   . Chronic pain   . Depression   . DVT (deep venous thrombosis) ([place])   . Hematemesis   . Hepatic steatosis   . Hyperlipidemia   . Hypertension   . IBS (irritable bowel syndrome)   . Morbid obesity ([place])   . OSA (obstructive sleep apnea)   . Pancreatitis   . PE (pulmonary thromboembolism) ([place])   . Peripheral neuropathy   . PUD (peptic ulcer disease)   . Renal disorder   . Stroke [facility])    a. ?details unclear - not seen on imaging when he was admitted in 05/2017 for TIA symptoms which were felt due to cervical radiculopathy.  . Thoracic aortic ectasia (HCC)    a. 4.3cm ectatic ascending thoracic aorta by CT 06/2017.   . Type 2 diabetes mellitus ([place])     PAST SURGICAL HISTORY: Past Surgical History:  Procedure Laterality Date  . CARDIAC CATHETERIZATION N/A 05/31/2016   Procedure: Left Heart Cath and Coronary Angiography;  Surgeon: Peter M Martinique, MD;  Location: [place] CV LAB;  Service: Cardiovascular;  Laterality: N/A;  . CARDIAC CATHETERIZATION N/A 05/31/2016   Procedure: Intravascular Pressure Wire/FFR Study;  Surgeon: Peter M Martinique, MD;  Location: [place] CV LAB;  Service: Cardiovascular;  Laterality: N/A;  . CARDIAC CATHETERIZATION N/A 05/31/2016   Procedure: Coronary Stent Intervention;  Surgeon: Peter M Martinique, MD;  Location: [place] CV LAB;  Service: Cardiovascular;  Laterality: N/A;  . LEFT HEART CATH AND CORONARY ANGIOGRAPHY N/A 12/08/2017   Procedure: LEFT HEART CATH AND CORONARY ANGIOGRAPHY;  Surgeon: Leonie Man, MD;  Location: [place] CV LAB;  Service: Cardiovascular;  Laterality: N/A;  . LEFT HEART CATHETERIZATION WITH CORONARY ANGIOGRAM N/A 02/03/2014   Procedure: LEFT HEART CATHETERIZATION WITH CORONARY ANGIOGRAM;  Surgeon: Pixie Casino, MD;  Location: [facility] CATH LAB;  Service: Cardiovascular;  Laterality: N/A;  . left leg stent       FAMILY HISTORY: Family History  Problem  Relation Age of Onset  . Cancer Father   . Hypertension Mother   . Diabetes Mother   . Breast cancer Mother   . Hypertension Brother   . Diabetes Brother   . Hypertension Sister   . Diabetes Sister  SOCIAL HISTORY:  Social History   Socioeconomic History  . Marital status: Single    Spouse name: Not on file  . Number of children: 3  . Years of education: 29  . Highest education level: Not on file  Occupational History  . Occupation: Disabled  Social Needs  . Financial resource strain: Not on file  . Food insecurity:    Worry: Not on file    Inability: Not on file  . Transportation needs:    Medical: Not on file    Non-medical: Not on file  Tobacco Use  . Smoking status: Never Smoker  . Smokeless tobacco: Never Used  Substance and Sexual Activity  . Alcohol use: No  . Drug use: No  . Sexual activity: Not on file  Lifestyle  . Physical activity:    Days per week: Not on file    Minutes per session: Not on file  . Stress: Not on file  Relationships  . Social connections:    Talks on phone: Not on file    Gets together: Not on file    Attends religious service: Not on file    Active member of club or organization: Not on file    Attends meetings of clubs or organizations: Not on file    Relationship status: Not on file  . Intimate partner violence:    Fear of current or ex partner: Not on file    Emotionally abused: Not on file    Physically abused: Not on file    Forced sexual activity: Not on file  Other Topics Concern  . Not on file  Social History Narrative   Independent and ambulatory with cane.   Lives at home alone.   Right-handed.   1-2 cups caffeine per day.     PHYSICAL EXAM   Vitals:   10/09/18 1452  BP: 122/67  Pulse: 96  Weight: (!) 304 lb (137.9 kg)  Height: 5\' 10"  (1.778 m)    Not recorded      Body mass index is 43.62 kg/m.  PHYSICAL EXAMNIATION:  Gen: NAD, conversant, well nourised, obese, well groomed                      Cardiovascular: Regular rate rhythm, no peripheral edema, warm, nontender. Eyes: Conjunctivae clear without exudates or hemorrhage Neck: Supple, no carotid bruits. Pulmonary: Clear to auscultation bilaterally   NEUROLOGICAL EXAM:  MENTAL STATUS: Speech:    Speech is normal; fluent and spontaneous with normal comprehension.  Cognition:     Orientation to time, place and person     Normal recent and remote memory     Normal Attention span and concentration     Normal Language, naming, repeating,spontaneous speech     Fund of knowledge   CRANIAL NERVES: CN II: Visual fields are full to confrontation. Pupils are round equal and briskly reactive to light. CN III, IV, VI: extraocular movement are normal. No ptosis. CN V: Facial sensation is intact to pinprick in all 3 divisions bilaterally. Corneal responses are intact.  CN VII: Face is symmetric with normal eye closure and smile. CN VIII: Hearing is normal to rubbing fingers CN IX, X: Palate elevates symmetrically. Phonation is normal. CN XI: Head turning and shoulder shrug are intact CN XII: Tongue is midline with normal movements and no atrophy.  MOTOR: Variable effort on motor examination, felt there was no significant muscle weakness  REFLEXES: Reflexes are 2+ and symmetric at the biceps, triceps,  knees, and ankles. Plantar responses are flexor.  SENSORY: Intact to light touch, pinprick, positional sensation and vibratory sensation are intact in fingers and toes.  COORDINATION: Rapid alternating movements and fine finger movements are intact. There is no dysmetria on finger-to-nose and heel-knee-shin.    GAIT/STANCE: He needs push up to get up from seated position, rely on his cane, obese, cautious, unsteady gait   DIAGNOSTIC DATA (LABS, IMAGING, TESTING) - I reviewed patient records, labs, notes, testing and imaging myself where available.   ASSESSMENT AND PLAN  [name] is a 57 y.o. male   Neck pain,  radiating pain to left shoulder,  Need to rule out left cervical radiculopathy  Proceed with EMG nerve conduction study  Evidence of cervical degenerative changes on MRI of cervical spine, will refer him to neurosurgeon for further evaluation   Marcial Pacas, M.D. Ph.D.  [facility] [address] [place],  [address] Ph: [phone] Fax: [phone]   CC: Rogers Blocker, MD, seen in request by [name], FNP

## 2018-10-10 ENCOUNTER — Ambulatory Visit: Payer: Medicaid Other

## 2018-10-10 DIAGNOSIS — R2681 Unsteadiness on feet: Secondary | ICD-10-CM

## 2018-10-10 DIAGNOSIS — R2689 Other abnormalities of gait and mobility: Secondary | ICD-10-CM

## 2018-10-10 DIAGNOSIS — M6281 Muscle weakness (generalized): Secondary | ICD-10-CM

## 2018-10-10 DIAGNOSIS — M545 Low back pain, unspecified: Secondary | ICD-10-CM

## 2018-10-10 DIAGNOSIS — G8929 Other chronic pain: Secondary | ICD-10-CM

## 2018-10-10 NOTE — Therapy (Signed)
[place], [place], [address] Phone: [phone]   Fax:  [phone]  Physical Therapy Treatment  Patient Details  Name: [name] MRN: [id number] Date of Birth: [birth year] Referring Provider (PT): Lowella Fairy Jerilee Hoh, FNP-C   Encounter Date: 10/10/2018  PT End of Session - 10/10/18 1026    Visit Number  3    Number of Visits  4    Date for PT Re-Evaluation  10/23/18    Authorization Type  Medicaid    Authorization Time Period  through 10/15/18    Authorization - Visit Number  2    Authorization - Number of Visits  3    PT Start Time  0630   PT late   PT Stop Time  1110   heat last 12 min   PT Time Calculation (min)  42 min    Activity Tolerance  Patient tolerated treatment well;No increased pain    Behavior During Therapy  WFL for tasks assessed/performed       Past Medical History:  Diagnosis Date  . Anemia   . Arthritis   . Back pain   . CAD (coronary artery disease)    a. s/p DES to LAD in 05/2016  . Cervical radiculopathy   . Chronic diastolic CHF (congestive heart failure) ([place])   . Chronic pain   . Depression   . DVT (deep venous thrombosis) ([place])   . Hematemesis   . Hepatic steatosis   . Hyperlipidemia   . Hypertension   . IBS (irritable bowel syndrome)   . Morbid obesity ([place])   . OSA (obstructive sleep apnea)   . Pancreatitis   . PE (pulmonary thromboembolism) ([place])   . Peripheral neuropathy   . PUD (peptic ulcer disease)   . Renal disorder   . Stroke [facility])    a. ?details unclear - not seen on imaging when he was admitted in 05/2017 for TIA symptoms which were felt due to cervical radiculopathy.  . Thoracic aortic ectasia (HCC)    a. 4.3cm ectatic ascending thoracic aorta by CT 06/2017.   . Type 2 diabetes mellitus ([place])     Past Surgical History:  Procedure Laterality Date  . CARDIAC CATHETERIZATION N/A 05/31/2016   Procedure: Left Heart Cath and Coronary  Angiography;  Surgeon: Peter M Martinique, MD;  Location: [place] [facility];  Service: Cardiovascular;  Laterality: N/A;  . CARDIAC CATHETERIZATION N/A 05/31/2016   Procedure: Intravascular Pressure Wire/FFR Study;  Surgeon: Peter M Martinique, MD;  Location: [place] [facility];  Service: Cardiovascular;  Laterality: N/A;  . CARDIAC CATHETERIZATION N/A 05/31/2016   Procedure: Coronary Stent Intervention;  Surgeon: Peter M Martinique, MD;  Location: [place] [facility];  Service: Cardiovascular;  Laterality: N/A;  . LEFT HEART CATH AND CORONARY ANGIOGRAPHY N/A 12/08/2017   Procedure: LEFT HEART CATH AND CORONARY ANGIOGRAPHY;  Surgeon: Leonie Man, MD;  Location: [place] [facility];  Service: Cardiovascular;  Laterality: N/A;  . LEFT HEART CATHETERIZATION WITH CORONARY ANGIOGRAM N/A 02/03/2014   Procedure: LEFT HEART CATHETERIZATION WITH CORONARY ANGIOGRAM;  Surgeon: Pixie Casino, MD;  Location: [facility] CATH LAB;  Service: Cardiovascular;  Laterality: N/A;  . left leg stent       There were no vitals filed for this visit.  Subjective Assessment - 10/10/18 1032    Subjective  No better and other issues make getting out of bed hard today.  He states LT neck and arm to hand numbness. MD  said degenerative changes. He will see neurosurgeon    Pertinent History  cardiac history, stents, cervical radiculopathy, CVA, chronic pain-in pain management    Pain Score  6     Pain Location  Back    Pain Orientation  Right;Left;Lower    Pain Descriptors / Indicators  Aching;Dull;Burning    Pain Type  Chronic pain    Pain Onset  More than a month ago    Pain Frequency  Constant    Aggravating Factors   every thing                       [facility] Adult PT Treatment/Exercise - 10/10/18 0001      Lumbar Exercises: Stretches   Pelvic Tilt  10 reps;10 seconds    Figure 4 Stretch  2 reps;30 seconds    Figure 4 Stretch Limitations  RT/LT    Other Lumbar Stretch Exercise  Arm openings RT and LT x 5     Other  Lumbar Stretch Exercise  LTR  x 3 RT/LT  30 sec with legs apart to get incr hip rotation stretching      Lumbar Exercises: Aerobic   Nustep  L5   5 min   LE only      Lumbar Exercises: Supine   Clam  10 reps    Clam Limitations  single leg with ab set    Bridge with [employer]  15 reps    Bridge with [employer] Limitations  short range      Modalities   Modalities  Moist Heat      Moist Heat Therapy   Number Minutes Moist Heat  12 Minutes    Moist Heat Location  Lumbar Spine   sitting                 PT Long Term Goals - 10/10/18 1114      PT LONG TERM GOAL #1   Title  Independent with HEP    Status  On-going      PT LONG TERM GOAL #2   Title  Tolerate sitting 30 min or greater for eating meals, car travel    Baseline  about same    Status  On-going      PT LONG TERM GOAL #3   Title  Tolerate walking 35-40 min for grocery shopping, IADLs    Baseline  30 max    Status  On-going            Plan - 10/10/18 1031    Clinical Impression Statement  No better He reports other things going on.  On review he was at neurologist yesterday.  Degenerative changes in neck. ALways feel better after heat    Clinical Impairments Affecting Rehab Potential  chronic pain history, past therapy, comorbidities    PT Treatment/Interventions  ADLs/Self Care Home Management;Cryotherapy;Electrical Stimulation;Ultrasound;Moist Heat;Traction;Neuromuscular re-education;Functional mobility training;Therapeutic activities;Therapeutic exercise;Patient/family education;Manual techniques;Dry needling    PT Next Visit Plan  Continue core strength and manual heat as needed.  Reassessment and extention request    PT Home Exercise Plan  pelvic tilts, SKTC, hip. add. isometrics, clamshell, hip bridges, hamstring and piriformis stretches, clam and reverse clam, ab set in standing , prone hip ext with pillows.     Consulted and Agree with Plan of Care  Patient       Patient will benefit  from skilled therapeutic intervention in order to improve the following deficits and impairments:  Pain, Impaired  sensation, Decreased activity tolerance, Decreased endurance, Decreased range of motion, Decreased strength, Obesity, Difficulty walking  Visit Diagnosis: Chronic bilateral low back pain without sciatica  Muscle weakness (generalized)  Unsteadiness on feet  Other abnormalities of gait and mobility     Problem List Patient Active Problem List   Diagnosis Date Noted  . Neck pain 10/09/2018  . Left arm weakness 10/09/2018  . B12 deficiency 08/10/2017  . Persistent headaches 08/09/2017  . GERD (gastroesophageal reflux disease) 06/29/2017  . Left arm numbness   . Cerebral embolism with cerebral infarction 06/17/2017  . TIA (transient ischemic attack) 06/17/2017  . Chronic back pain 12/29/2016  . Depression 12/15/2016  . Vitamin D deficiency 12/09/2016  . Right hip pain 12/07/2016  . Hypokalemia 12/07/2016  . Type 2 diabetes mellitus with vascular disease ([place]) 05/31/2016  . Normocytic normochromic anemia 05/31/2016  . Chest pain 05/31/2016  . CAD (coronary artery disease) 01/06/2016  . DVT (deep venous thrombosis) ([place]) 01/06/2016  . Lactic acidosis 05/28/2014  . Nonspecific chest pain 01/29/2014  . Uncontrolled secondary diabetes with peripheral neuropathy ([place]) 01/29/2014  . Obesity, Class III, BMI 40-49.9 (morbid obesity) ([place]) 01/29/2014  . Snoring 01/29/2014  . Dyslipidemia 01/29/2014  . HTN (hypertension) 01/29/2014  . Abnormal nuclear stress test 01/29/2014    Darrel Hoover  PT 10/10/2018, 11:18 AM  [facility] [address] [place], [place], [address] Phone: [phone]   Fax:  [phone]  Name: [name] MRN: [id number] Date of Birth: 1961/09/10

## 2018-10-16 ENCOUNTER — Ambulatory Visit: Payer: Medicaid Other

## 2018-10-16 DIAGNOSIS — G8929 Other chronic pain: Secondary | ICD-10-CM

## 2018-10-16 DIAGNOSIS — R2689 Other abnormalities of gait and mobility: Secondary | ICD-10-CM

## 2018-10-16 DIAGNOSIS — M545 Low back pain, unspecified: Secondary | ICD-10-CM

## 2018-10-16 DIAGNOSIS — M6281 Muscle weakness (generalized): Secondary | ICD-10-CM

## 2018-10-16 DIAGNOSIS — R2681 Unsteadiness on feet: Secondary | ICD-10-CM

## 2018-10-16 NOTE — Therapy (Signed)
[place], [place], [address] Phone: [phone]   Fax:  [phone]  Physical Therapy Treatment  Patient Details  Name: [name] MRN: [id number] Date of Birth: 09/06/1961 Referring Provider (PT): Lowella Fairy Jerilee Hoh, FNP-C   Encounter Date: 10/16/2018  PT End of Session - 10/16/18 0936    Visit Number  4    Number of Visits  4    Date for PT Re-Evaluation  10/23/18    Authorization Type  Medicaid    Authorization Time Period  through 10/15/18    Authorization - Visit Number  3    Authorization - Number of Visits  3    PT Start Time  0930    PT Stop Time  1025    PT Time Calculation (min)  55 min    Activity Tolerance  Patient tolerated treatment well;No increased pain    Behavior During Therapy  WFL for tasks assessed/performed       Past Medical History:  Diagnosis Date  . Anemia   . Arthritis   . Back pain   . CAD (coronary artery disease)    a. s/p DES to LAD in 05/2016  . Cervical radiculopathy   . Chronic diastolic CHF (congestive heart failure) ([place])   . Chronic pain   . Depression   . DVT (deep venous thrombosis) ([place])   . Hematemesis   . Hepatic steatosis   . Hyperlipidemia   . Hypertension   . IBS (irritable bowel syndrome)   . Morbid obesity ([place])   . OSA (obstructive sleep apnea)   . Pancreatitis   . PE (pulmonary thromboembolism) ([place])   . Peripheral neuropathy   . PUD (peptic ulcer disease)   . Renal disorder   . Stroke [facility])    a. ?details unclear - not seen on imaging when he was admitted in 05/2017 for TIA symptoms which were felt due to cervical radiculopathy.  . Thoracic aortic ectasia (H[facility])    a. 4.3cm ectatic ascending thoracic aorta by CT 06/2017.   . Type 2 diabetes mellitus ([place])     Past Surgical History:  Procedure Laterality Date  . CARDIAC CATHETERIZATION N/A 05/31/2016   Procedure: Left Heart Cath and Coronary Angiography;  Surgeon: Peter M Martinique,  MD;  Location: [place] [facility];  Service: Cardiovascular;  Laterality: N/A;  . CARDIAC CATHETERIZATION N/A 05/31/2016   Procedure: Intravascular Pressure Wire/FFR Study;  Surgeon: Peter M Martinique, MD;  Location: [place] [facility];  Service: Cardiovascular;  Laterality: N/A;  . CARDIAC CATHETERIZATION N/A 05/31/2016   Procedure: Coronary Stent Intervention;  Surgeon: Peter M Martinique, MD;  Location: [place] [facility];  Service: Cardiovascular;  Laterality: N/A;  . LEFT HEART CATH AND CORONARY ANGIOGRAPHY N/A 12/08/2017   Procedure: LEFT HEART CATH AND CORONARY ANGIOGRAPHY;  Surgeon: Leonie Man, MD;  Location: [place] [facility];  Service: Cardiovascular;  Laterality: N/A;  . LEFT HEART CATHETERIZATION WITH CORONARY ANGIOGRAM N/A 02/03/2014   Procedure: LEFT HEART CATHETERIZATION WITH CORONARY ANGIOGRAM;  Surgeon: Pixie Casino, MD;  Location: [facility] CATH LAB;  Service: Cardiovascular;  Laterality: N/A;  . left leg stent       There were no vitals filed for this visit.  Subjective Assessment - 10/16/18 0935    Subjective  Mild pain and stiffness today.     Pain Score  3     Pain Location  Back    Pain Orientation  Right;Left;Lower  Pain Descriptors / Indicators  Dull;Aching;Burning    Pain Type  Chronic pain    Pain Onset  More than a month ago    Pain Frequency  Constant    Aggravating Factors   activity    Pain Relieving Factors  med and sometime exercise                       [facility] Adult PT Treatment/Exercise - 10/16/18 0001      Lumbar Exercises: Stretches   Passive Hamstring Stretch  Right;Left;1 rep;30 seconds    Passive Hamstring Stretch Limitations  sitting  with DF    Pelvic Tilt  10 reps;10 seconds    Figure 4 Stretch  2 reps;30 seconds    Figure 4 Stretch Limitations  RT/LT    Other Lumbar Stretch Exercise  Arm openings RT and LT x 5       Lumbar Exercises: Aerobic   Nustep  L5   5 min   LE only      Lumbar Exercises: Supine   Clam  10 reps     Clam Limitations  both legs with ab set  with green band and  ab set    Bridge with [employer]  15 reps    Bridge with [employer] Limitations  short range with glut set      Lumbar Exercises: Sidelying   Clam  Right;Left;10 reps      Lumbar Exercises: Prone   Straight Leg Raise  10 reps    Straight Leg Raises Limitations  RT/LT                  PT Long Term Goals - 10/16/18 0102      PT LONG TERM GOAL #1   Title  Independent with HEP    Baseline  independent with initial hEp     Time  4    Period  Weeks    Status  On-going      PT LONG TERM GOAL #2   Title  Tolerate sitting 30 min or greater for eating meals, car travel    Baseline  about same    Time  4    Period  Weeks    Status  On-going      PT LONG TERM GOAL #3   Title  Tolerate walking 35-40 min for grocery shopping, IADLs    Baseline  30 max    Time  4    Status  On-going            Plan - 10/16/18 0933    Clinical Impression Statement  Better day today. Able to perform all  HEP correctly.  As he is good with his HEp he desrve extension to progress HEP and see if pain can be reduced as it was today.    Clinical Impairments Affecting Rehab Potential  chronic pain history, past therapy, comorbidities    PT Frequency  2x / week    PT Duration  4 weeks    PT Treatment/Interventions  ADLs/Self Care Home Management;Cryotherapy;Electrical Stimulation;Ultrasound;Moist Heat;Traction;Neuromuscular re-education;Functional mobility training;Therapeutic activities;Therapeutic exercise;Patient/family education;Manual techniques;Dry needling    PT Next Visit Plan  Continue core strength and manual heat as needed.      PT Home Exercise Plan  pelvic tilts, SKTC, hip. add. isometrics, clamshell, hip bridges, hamstring and piriformis stretches, clam and reverse clam, ab set in standing , prone hip ext with pillows.     Consulted and Agree  with Plan of Care  Patient       Patient will benefit from skilled  therapeutic intervention in order to improve the following deficits and impairments:  Pain, Impaired sensation, Decreased activity tolerance, Decreased endurance, Decreased range of motion, Decreased strength, Obesity, Difficulty walking  Visit Diagnosis: Chronic bilateral low back pain without sciatica  Muscle weakness (generalized)  Unsteadiness on feet  Other abnormalities of gait and mobility     Problem List Patient Active Problem List   Diagnosis Date Noted  . Neck pain 10/09/2018  . Left arm weakness 10/09/2018  . B12 deficiency 08/10/2017  . Persistent headaches 08/09/2017  . GERD (gastroesophageal reflux disease) 06/29/2017  . Left arm numbness   . Cerebral embolism with cerebral infarction 06/17/2017  . TIA (transient ischemic attack) 06/17/2017  . Chronic back pain 12/29/2016  . Depression 12/15/2016  . Vitamin D deficiency 12/09/2016  . Right hip pain 12/07/2016  . Hypokalemia 12/07/2016  . Type 2 diabetes mellitus with vascular disease ([place]) 05/31/2016  . Normocytic normochromic anemia 05/31/2016  . Chest pain 05/31/2016  . CAD (coronary artery disease) 01/06/2016  . DVT (deep venous thrombosis) ([place]) 01/06/2016  . Lactic acidosis 05/28/2014  . Nonspecific chest pain 01/29/2014  . Uncontrolled secondary diabetes with peripheral neuropathy ([place]) 01/29/2014  . Obesity, Class III, BMI 40-49.9 (morbid obesity) ([place]) 01/29/2014  . Snoring 01/29/2014  . Dyslipidemia 01/29/2014  . HTN (hypertension) 01/29/2014  . Abnormal nuclear stress test 01/29/2014    Darrel Hoover  PT 10/16/2018, 10:11 AM  [facility] [address] [place], [place], [address] Phone: [phone]   Fax:  [phone]  Name: [name] MRN: [id number] Date of Birth: Nov 24, 1960

## 2018-10-24 ENCOUNTER — Ambulatory Visit (HOSPITAL_COMMUNITY)
Admission: EM | Admit: 2018-10-24 | Discharge: 2018-10-24 | Disposition: A | Discharge status: Home / Self Care | Payer: Medicaid Other | Attending: Family Medicine | Admitting: Family Medicine

## 2018-10-24 ENCOUNTER — Encounter (HOSPITAL_COMMUNITY): Payer: Self-pay | Admitting: Emergency Medicine

## 2018-10-24 DIAGNOSIS — G8929 Other chronic pain: Secondary | ICD-10-CM | POA: Insufficient documentation

## 2018-10-24 DIAGNOSIS — M25551 Pain in right hip: Secondary | ICD-10-CM | POA: Diagnosis not present

## 2018-10-24 MED ORDER — KETOROLAC TROMETHAMINE 60 MG/2ML IM SOLN
INTRAMUSCULAR | Status: AC
  Filled 2018-10-24: qty 2

## 2018-10-24 MED ORDER — KETOROLAC TROMETHAMINE 60 MG/2ML IM SOLN
60.0000 mg | Freq: Once | INTRAMUSCULAR | Status: AC
  Administered 2018-10-24: 60 mg via INTRAMUSCULAR

## 2018-10-24 NOTE — ED Triage Notes (Signed)
Pt c/o chronic R hip pain, states its been going on for "a while" it just recently has been hurting more, states he usually goes to his ortho doctor for it.

## 2018-10-24 NOTE — Discharge Instructions (Addendum)
Toradol injection given here for hip pain Follow-up with your primary care for continued or worsening symptoms

## 2018-10-24 NOTE — ED Provider Notes (Signed)
[place]    CSN: [id number] Arrival date & time: 10/24/18  7616     History   Chief Complaint Chief Complaint  Patient presents with  . Hip Pain    HPI [name] is a 58 y.o. male.   Is a 58 year old male with chronic right hip pain.  He presents with worsening pain since waking up this morning.  He has been taking his oxycodone and muscle relaxers as prescribed for his chronic pain.  He denies any new injuries or new problems associated with his chronic pain.  He denies any fever, chills, body aches, night sweats.   ROS per HPI    Hip Pain     Past Medical History:  Diagnosis Date  . Anemia   . Arthritis   . Back pain   . CAD (coronary artery disease)    a. s/p DES to LAD in 05/2016  . Cervical radiculopathy   . Chronic diastolic CHF (congestive heart failure) ([place])   . Chronic pain   . Depression   . DVT (deep venous thrombosis) ([place])   . Hematemesis   . Hepatic steatosis   . Hyperlipidemia   . Hypertension   . IBS (irritable bowel syndrome)   . Morbid obesity ([place])   . OSA (obstructive sleep apnea)   . Pancreatitis   . PE (pulmonary thromboembolism) ([place])   . Peripheral neuropathy   . PUD (peptic ulcer disease)   . Renal disorder   . Stroke [facility])    a. ?details unclear - not seen on imaging when he was admitted in 05/2017 for TIA symptoms which were felt due to cervical radiculopathy.  . Thoracic aortic ectasia (HCC)    a. 4.3cm ectatic ascending thoracic aorta by CT 06/2017.   . Type 2 diabetes mellitus [facility])     Patient Active Problem List   Diagnosis Date Noted  . Neck pain 10/09/2018  . Left arm weakness 10/09/2018  . B12 deficiency 08/10/2017  . Persistent headaches 08/09/2017  . GERD (gastroesophageal reflux disease) 06/29/2017  . Left arm numbness   . Cerebral embolism with cerebral infarction 06/17/2017  . TIA (transient ischemic attack) 06/17/2017  . Chronic back pain 12/29/2016  . Depression 12/15/2016  . Vitamin D  deficiency 12/09/2016  . Right hip pain 12/07/2016  . Hypokalemia 12/07/2016  . Type 2 diabetes mellitus with vascular disease ([place]) 05/31/2016  . Normocytic normochromic anemia 05/31/2016  . Chest pain 05/31/2016  . CAD (coronary artery disease) 01/06/2016  . DVT (deep venous thrombosis) ([place]) 01/06/2016  . Lactic acidosis 05/28/2014  . Nonspecific chest pain 01/29/2014  . Uncontrolled secondary diabetes with peripheral neuropathy ([place]) 01/29/2014  . Obesity, Class III, BMI 40-49.9 (morbid obesity) ([place]) 01/29/2014  . Snoring 01/29/2014  . Dyslipidemia 01/29/2014  . HTN (hypertension) 01/29/2014  . Abnormal nuclear stress test 01/29/2014    Past Surgical History:  Procedure Laterality Date  . CARDIAC CATHETERIZATION N/A 05/31/2016   Procedure: Left Heart Cath and Coronary Angiography;  Surgeon: Peter M Martinique, MD;  Location: [place] [facility];  Service: Cardiovascular;  Laterality: N/A;  . CARDIAC CATHETERIZATION N/A 05/31/2016   Procedure: Intravascular Pressure Wire/FFR Study;  Surgeon: Peter M Martinique, MD;  Location: [place] CV LAB;  Service: Cardiovascular;  Laterality: N/A;  . CARDIAC CATHETERIZATION N/A 05/31/2016   Procedure: Coronary Stent Intervention;  Surgeon: Peter M Martinique, MD;  Location: [place] [facility] LAB;  Service: Cardiovascular;  Laterality: N/A;  . LEFT HEART CATH AND CORONARY  ANGIOGRAPHY N/A 12/08/2017   Procedure: LEFT HEART CATH AND CORONARY ANGIOGRAPHY;  Surgeon: Leonie Man, MD;  Location: [place] [facility];  Service: Cardiovascular;  Laterality: N/A;  . LEFT HEART CATHETERIZATION WITH CORONARY ANGIOGRAM N/A 02/03/2014   Procedure: LEFT HEART CATHETERIZATION WITH CORONARY ANGIOGRAM;  Surgeon: Pixie Casino, MD;  Location: [facility] CATH LAB;  Service: Cardiovascular;  Laterality: N/A;  . left leg stent          Home Medications    Prior to Admission medications   Medication Sig Start Date End Date Taking? Authorizing Provider  aspirin 81 MG chewable  tablet Chew 1 tablet (81 mg total) by mouth daily. 06/01/16   Elwin Mocha, MD  atorvastatin (LIPITOR) 80 MG tablet Take 1 tablet (80 mg total) by mouth daily. Patient taking differently: Take 80 mg by mouth daily at 6 PM.  01/26/17   Ahmed Prima, Fransisco Hertz, PA-C  carvedilol (COREG) 25 MG tablet Take 1 tablet (25 mg total) by mouth 2 (two) times daily with a meal. 01/26/17   Strader, [place] M, PA-C  dapagliflozin propanediol (FARXIGA) 5 MG TABS tablet Take 5 mg by mouth daily.    [provider]  diclofenac sodium (VOLTAREN) 1 % GEL Apply 1 application topically 4 (four) times daily. 04/05/18   Melynda Ripple, MD  dicyclomine (BENTYL) 20 MG tablet Take 1 tablet (20 mg total) by mouth 2 (two) times daily. 06/06/18   Palumbo, April, MD  DULoxetine (CYMBALTA) 60 MG capsule Take 1 capsule (60 mg total) by mouth daily. 03/08/18   Marcial Pacas, MD  famotidine (PEPCID) 40 MG tablet Take 1 tablet (40 mg total) by mouth at bedtime. Patient taking differently: Take 40 mg by mouth daily.  06/18/17   Barton Dubois, MD  furosemide (LASIX) 40 MG tablet Take 1 tablet (40 mg total) by mouth every evening. Patient taking differently: Take 40 mg by mouth 3 (three) times daily.  12/09/17   Velvet Bathe, MD  glipiZIDE (GLUCOTROL) 10 MG tablet Take 10 mg by mouth 2 (two) times daily. 05/30/17   [provider]  hydrALAZINE (APRESOLINE) 25 MG tablet Take 3 tablets (75 mg total) by mouth 3 (three) times daily. 07/19/18   Lelon Perla, MD  Insulin Detemir (LEVEMIR) 100 UNIT/ML Pen Inject 20 Units into the skin daily at 10 pm. Patient taking differently: Inject 55 Units into the skin daily at 10 pm.  06/18/17   Barton Dubois, MD  Insulin Pen Needle 31G X 5 MM MISC Use 1 needle daily to inject insulin as prescribed 06/18/17   Barton Dubois, MD  isosorbide dinitrate (ISORDIL) 30 MG tablet Take 1 tablet (30 mg total) by mouth every morning. 12/10/17   Velvet Bathe, MD  isosorbide mononitrate (IMDUR) 30 MG 24 hr  tablet Take 30 mg by mouth daily. 06/27/18   [provider]  lisinopril (PRINIVIL,ZESTRIL) 10 MG tablet Take 1 tablet (10 mg total) by mouth daily. Patient taking differently: Take 40 mg by mouth daily.  12/10/17   Velvet Bathe, MD  LYRICA 100 MG capsule Take 1 capsule (100 mg total) by mouth 2 (two) times daily. 10/09/18   Marcial Pacas, MD  metFORMIN (GLUCOPHAGE-XR) 500 MG 24 hr tablet Take 2 tablets (1,000 mg total) by mouth 2 (two) times daily. 07/02/17   Barton Dubois, MD  nitroGLYCERIN (NITROSTAT) 0.4 MG SL tablet Place 0.4 mg under the tongue every 5 (five) minutes as needed for chest pain. 12/17/16   [provider]  oxyCODONE (ROXICODONE) 15 MG immediate release tablet Take 15 mg 3 (three) times daily as needed by mouth for pain.  08/28/17   [provider]  pantoprazole (PROTONIX) 40 MG tablet Take 1 tablet (40 mg total) by mouth 2 (two) times daily. 06/18/17   Barton Dubois, MD  potassium chloride (MICRO-K) 10 MEQ CR capsule Take 20 mEq by mouth 3 (three) times daily with meals. 20 meq at lunch, 10 meq at dinner and 10 meq at bedtime 06/28/18   [provider]  potassium chloride SA (K-DUR,KLOR-CON) 20 MEQ tablet Take 20-40 mEq by mouth daily. 40 meq in the lunch, 20 med at dinner and 20 meq at bedtime    [provider]  sertraline (ZOLOFT) 50 MG tablet Take 50 mg by mouth daily.    [provider]  tiZANidine (ZANAFLEX) 2 MG tablet Take 2 mg by mouth 2 (two) times daily as needed for pain.    [provider]  traZODone (DESYREL) 50 MG tablet Take 1 tablet (50 mg total) by mouth at bedtime. 12/15/16   Florinda Marker, MD    Family History Family History  Problem Relation Age of Onset  . Cancer Father   . Hypertension Mother   . Diabetes Mother   . Breast cancer Mother   . Hypertension Brother   . Diabetes Brother   . Hypertension Sister   . Diabetes Sister     Social History Social History   Tobacco Use  . Smoking status:  Never Smoker  . Smokeless tobacco: Never Used  Substance Use Topics  . Alcohol use: No  . Drug use: No     Allergies   Coconut flavor [flavoring agent]; Coconut oil; Ibuprofen; Aleve [naproxen]; and Nsaids   Review of Systems Review of Systems   Physical Exam Triage Vital Signs ED Triage Vitals  Enc Vitals Group     BP 10/24/18 1007 136/78     Pulse Rate 10/24/18 1007 88     Resp 10/24/18 1007 16     Temp 10/24/18 1007 98.5 F (36.9 C)     Temp Source 10/24/18 1007 Oral     SpO2 10/24/18 1007 98 %     Weight --      Height --      Head Circumference --      Peak Flow --      Pain Score 10/24/18 1008 9     Pain Loc --      Pain Edu? --      Excl. in [place]? --    No data found.  Updated Vital Signs BP 136/78 (BP Location: Right Arm)   Pulse 88   Temp 98.5 F (36.9 C) (Oral)   Resp 16   SpO2 98%   Visual Acuity Right Eye Distance:   Left Eye Distance:   Bilateral Distance:    Right Eye Near:   Left Eye Near:    Bilateral Near:     Physical Exam Vitals signs reviewed.  Constitutional:      General: He is not in acute distress.    Appearance: Normal appearance. He is not ill-appearing, toxic-appearing or diaphoretic.  HENT:     Head: Normocephalic and atraumatic.     Nose: Nose normal.  Eyes:     Conjunctiva/sclera: Conjunctivae normal.  Neck:     Musculoskeletal: Normal range of motion.  Pulmonary:     Effort: Pulmonary effort is normal.  Musculoskeletal:        General:  Tenderness present. No swelling, deformity or signs of injury.     Comments: Tenderness to palpation of right lower lumbar paravertebral muscles and right hip area.  No bony tenderness. Pain elicited with standing, sitting and rotation of the hip.   Skin:    General: Skin is warm and dry.  Neurological:     Mental Status: He is alert.  Psychiatric:        Mood and Affect: Mood normal.      UC Treatments / Results  Labs (all labs ordered are listed, but only abnormal  results are displayed) Labs Reviewed - No data to display  EKG None  Radiology No results found.  Procedures Procedures (including critical care time)  Medications Ordered in UC Medications  ketorolac (TORADOL) injection 60 mg (60 mg Intramuscular Given 10/24/18 1103)    Initial Impression / Assessment and Plan / UC Course  I have reviewed the triage vital signs and the nursing notes.  Pertinent labs & imaging results that were available during my care of the patient were reviewed by me and considered in my medical decision making (see chart for details).     Toradol injection given here for right hip pain. He received this at a prior visit with relief Instructed to follow-up with his PCP for continued or worsening symptoms. Final Clinical Impressions(s) / UC Diagnoses   Final diagnoses:  Chronic pain of right hip     Discharge Instructions     Toradol injection given here for hip pain Follow-up with your primary care for continued or worsening symptoms    ED Prescriptions    None     Controlled Substance Prescriptions [place] Controlled Substance Registry consulted? Not Applicable   Orvan July, NP 10/24/18 1119

## 2018-10-30 ENCOUNTER — Ambulatory Visit: Payer: Medicaid Other | Attending: Family Medicine

## 2018-10-30 DIAGNOSIS — R293 Abnormal posture: Secondary | ICD-10-CM | POA: Diagnosis present

## 2018-10-30 DIAGNOSIS — M6281 Muscle weakness (generalized): Secondary | ICD-10-CM | POA: Insufficient documentation

## 2018-10-30 DIAGNOSIS — M545 Low back pain, unspecified: Secondary | ICD-10-CM

## 2018-10-30 DIAGNOSIS — G8929 Other chronic pain: Secondary | ICD-10-CM | POA: Insufficient documentation

## 2018-10-30 NOTE — Therapy (Addendum)
[facility] [place], [place], [address] Phone: [phone]   Fax:  [phone]  Physical Therapy Treatment  Patient Details  Name: [name] MRN: [id number] Date of Birth: June 10, 1961 Referring Provider (PT): Lowella Fairy Jerilee Hoh, FNP-C   Encounter Date: 10/30/2018  PT End of Session - 10/30/18 0846    Date for PT Re-Evaluation  11/28/18    Authorization Type  Medicaid    Authorization Time Period  through 11/28/18    Authorization - Visit Number  1    Authorization - Number of Visits  3    PT Start Time  0800    PT Stop Time  6811    PT Time Calculation (min)  44 min    Activity Tolerance  Patient tolerated treatment well;No increased pain    Behavior During Therapy  WFL for tasks assessed/performed       Past Medical History:  Diagnosis Date  . Anemia   . Arthritis   . Back pain   . CAD (coronary artery disease)    a. s/p DES to LAD in 05/2016  . Cervical radiculopathy   . Chronic diastolic CHF (congestive heart failure) ([place])   . Chronic pain   . Depression   . DVT (deep venous thrombosis) ([place])   . Hematemesis   . Hepatic steatosis   . Hyperlipidemia   . Hypertension   . IBS (irritable bowel syndrome)   . Morbid obesity ([place])   . OSA (obstructive sleep apnea)   . Pancreatitis   . PE (pulmonary thromboembolism) ([place])   . Peripheral neuropathy   . PUD (peptic ulcer disease)   . Renal disorder   . Stroke [facility])    a. ?details unclear - not seen on imaging when he was admitted in 05/2017 for TIA symptoms which were felt due to cervical radiculopathy.  . Thoracic aortic ectasia (HCC)    a. 4.3cm ectatic ascending thoracic aorta by CT 06/2017.   . Type 2 diabetes mellitus ([place])     Past Surgical History:  Procedure Laterality Date  . CARDIAC CATHETERIZATION N/A 05/31/2016   Procedure: Left Heart Cath and Coronary Angiography;  Surgeon: Peter M Martinique, MD;  Location: [place] CV LAB;  Service:  Cardiovascular;  Laterality: N/A;  . CARDIAC CATHETERIZATION N/A 05/31/2016   Procedure: Intravascular Pressure Wire/FFR Study;  Surgeon: Peter M Martinique, MD;  Location: [place] CV LAB;  Service: Cardiovascular;  Laterality: N/A;  . CARDIAC CATHETERIZATION N/A 05/31/2016   Procedure: Coronary Stent Intervention;  Surgeon: Peter M Martinique, MD;  Location: [place] CV LAB;  Service: Cardiovascular;  Laterality: N/A;  . LEFT HEART CATH AND CORONARY ANGIOGRAPHY N/A 12/08/2017   Procedure: LEFT HEART CATH AND CORONARY ANGIOGRAPHY;  Surgeon: Leonie Man, MD;  Location: [place] CV LAB;  Service: Cardiovascular;  Laterality: N/A;  . LEFT HEART CATHETERIZATION WITH CORONARY ANGIOGRAM N/A 02/03/2014   Procedure: LEFT HEART CATHETERIZATION WITH CORONARY ANGIOGRAM;  Surgeon: Pixie Casino, MD;  Location: [facility] CATH LAB;  Service: Cardiovascular;  Laterality: N/A;  . left leg stent       There were no vitals filed for this visit.  Subjective Assessment - 10/30/18 0803    Subjective  Pain today 9/10 in RT hip and lower back    Pain Score  9     Pain Location  Back    Pain Orientation  Right;Left;Lower    Pain Descriptors / Indicators  Dull;Aching;Burning    Pain Type  Chronic pain    Pain Onset  More than a month ago    Pain Frequency  Constant    Aggravating Factors   activity    Pain Relieving Factors  meds         [facility] PT Assessment - 10/30/18 0001      Standardized Balance Assessment   Standardized Balance Assessment  Berg Balance Test      Berg Balance Test   Sit to Stand  Able to stand without using hands and stabilize independently    Standing Unsupported  Able to stand safely 2 minutes    Sitting with Back Unsupported but Feet Supported on Floor or Stool  Able to sit safely and securely 2 minutes    Stand to Sit  Sits safely with minimal use of hands    Transfers  Able to transfer safely, minor use of hands    Standing Unsupported with Eyes Closed  Able to stand 10 seconds  safely    Standing Ubsupported with Feet Together  Able to place feet together independently and stand 1 minute safely    From Standing, Reach Forward with Outstretched Arm  Can reach forward >12 cm safely (5")    From Standing Position, Pick up Object from Floor  Able to pick up shoe, needs supervision   limited back flexion   From Standing Position, Turn to Look Behind Over each Shoulder  Turn sideways only but maintains balance   decreased trunk and hip flexibility   Turn 360 Degrees  Able to turn 360 degrees safely in 4 seconds or less    Standing Unsupported, Alternately Place Feet on Step/Stool  Able to stand independently and safely and complete 8 steps in 20 seconds    Standing Unsupported, One Foot in Front  Able to take small step independently and hold 30 seconds    Standing on One Leg  Able to lift leg independently and hold equal to or more than 3 seconds    Total Score  48    Berg comment:  Score in range for use of cane out of home . Explained to pt.                    [place] Adult PT Treatment/Exercise - 10/30/18 0001      Lumbar Exercises: Stretches   Active Hamstring Stretch  Right;Left;10 seconds    Active Hamstring Stretch Limitations  SLR sitting then 30 sec stretch each    Single Knee to Chest Stretch  Right;Left;2 reps;30 seconds    Single Knee to Chest Stretch Limitations  sitting      Lumbar Exercises: Aerobic   Nustep  L6   5 min   LE only      Lumbar Exercises: Standing   Row  Right;Left;20 reps    Theraband Level (Row)  Level 3 (Green)    Shoulder Extension  Right;Left;20 reps    Theraband Level (Shoulder Extension)  Level 3 (Green)    Other Standing Lumbar Exercises  and green band RT/Lt  cross chest pulls issued for home along with green band       Lumbar Exercises: Seated   Other Seated Lumbar Exercises  Ball squeeze with abdominal draw      Lumbar Exercises: Supine   Clam  10 reps    Clam Limitations  2 setsboth legs with ab set  with  green band and  ab set   sitting  PT Education - 10/30/18 0827    Education Details  active hamstring stretching  sit SLR x 10 with 30 sec passive stretching RT. /LT   2-3 reps  2x/day  also sit hip flexion 20-30 sec x 2 RT /Lt  2x/day    Person(s) Educated  Patient    Methods  Explanation;Demonstration;Handout;Verbal cues    Comprehension  Returned demonstration;Verbalized understanding          PT Long Term Goals - 10/16/18 0939      PT LONG TERM GOAL #1   Title  Independent with HEP    Baseline  independent with initial hEp     Time  4    Period  Weeks    Status  On-going      PT LONG TERM GOAL #2   Title  Tolerate sitting 30 min or greater for eating meals, car travel    Baseline  about same    Time  4    Period  Weeks    Status  On-going      PT LONG TERM GOAL #3   Title  Tolerate walking 35-40 min for grocery shopping, IADLs    Baseline  30 max    Time  4    Status  On-going            Plan - 10/30/18 0806    Clinical Impression Statement  Pt reports 9/10 pain yetr walks with no limp or significnat deviations and affect pleasant.   declined heat.   Progress HEP     Clinical Impairments Affecting Rehab Potential  chronic pain history, past therapy, comorbidities    PT Treatment/Interventions  ADLs/Self Care Home Management;Cryotherapy;Electrical Stimulation;Ultrasound;Moist Heat;Traction;Neuromuscular re-education;Functional mobility training;Therapeutic activities;Therapeutic exercise;Patient/family education;Manual techniques;Dry needling    PT Next Visit Plan  Continue core strength and manual heat as needed.      PT Home Exercise Plan  pelvic tilts, SKTC, hip. add. isometrics, clamshell, hip bridges, hamstring and piriformis stretches, clam and reverse clam, ab set in standing , prone hip ext with pillows.   Row , extension snd cross chest pulls RT and LT green band    Consulted and Agree with Plan of Care  Patient       Patient will  benefit from skilled therapeutic intervention in order to improve the following deficits and impairments:  Pain, Impaired sensation, Decreased activity tolerance, Decreased endurance, Decreased range of motion, Decreased strength, Obesity, Difficulty walking  Visit Diagnosis: Chronic bilateral low back pain without sciatica  Muscle weakness (generalized)     Problem List Patient Active Problem List   Diagnosis Date Noted  . Neck pain 10/09/2018  . Left arm weakness 10/09/2018  . B12 deficiency 08/10/2017  . Persistent headaches 08/09/2017  . GERD (gastroesophageal reflux disease) 06/29/2017  . Left arm numbness   . Cerebral embolism with cerebral infarction 06/17/2017  . TIA (transient ischemic attack) 06/17/2017  . Chronic back pain 12/29/2016  . Depression 12/15/2016  . Vitamin D deficiency 12/09/2016  . Right hip pain 12/07/2016  . Hypokalemia 12/07/2016  . Type 2 diabetes mellitus with vascular disease ([place]) 05/31/2016  . Normocytic normochromic anemia 05/31/2016  . Chest pain 05/31/2016  . CAD (coronary artery disease) 01/06/2016  . DVT (deep venous thrombosis) ([place]) 01/06/2016  . Lactic acidosis 05/28/2014  . Nonspecific chest pain 01/29/2014  . Uncontrolled secondary diabetes with peripheral neuropathy ([place]) 01/29/2014  . Obesity, Class III, BMI 40-49.9 (morbid obesity) ([place]) 01/29/2014  . Snoring 01/29/2014  . Dyslipidemia  01/29/2014  . HTN (hypertension) 01/29/2014  . Abnormal nuclear stress test 01/29/2014    Darrel Hoover  PT 10/30/2018, 8:46 AM  [facility] [address] [place], [place], [address] Phone: [phone]   Fax:  [phone]  Name: [name] MRN: [id number] Date of Birth: June 13, 1961

## 2018-11-01 ENCOUNTER — Other Ambulatory Visit: Payer: Self-pay | Admitting: Neurological Surgery

## 2018-11-01 ENCOUNTER — Ambulatory Visit: Payer: Medicaid Other

## 2018-11-01 DIAGNOSIS — M6281 Muscle weakness (generalized): Secondary | ICD-10-CM

## 2018-11-01 DIAGNOSIS — M5412 Radiculopathy, cervical region: Secondary | ICD-10-CM

## 2018-11-01 DIAGNOSIS — M545 Low back pain, unspecified: Secondary | ICD-10-CM

## 2018-11-01 DIAGNOSIS — R293 Abnormal posture: Secondary | ICD-10-CM

## 2018-11-01 DIAGNOSIS — G8929 Other chronic pain: Secondary | ICD-10-CM

## 2018-11-01 NOTE — Therapy (Signed)
[place], [place], [address] Phone: [phone]   Fax:  [phone]  Physical Therapy Treatment  Patient Details  Name: [name] MRN: [id number] Date of Birth: [birth year] Referring Provider (PT): Lowella Fairy Jerilee Hoh, FNP-C   Encounter Date: 11/01/2018  PT End of Session - 11/01/18 0932    Visit Number  6    Number of Visits  8    Date for PT Re-Evaluation  11/28/18    Authorization Type  Medicaid    Authorization Time Period  through 11/28/18    Authorization - Visit Number  2    Authorization - Number of Visits  3    PT Start Time  0932    PT Stop Time  2778    PT Time Calculation (min)  43 min    Activity Tolerance  Patient tolerated treatment well;No increased pain    Behavior During Therapy  WFL for tasks assessed/performed       Past Medical History:  Diagnosis Date  . Anemia   . Arthritis   . Back pain   . CAD (coronary artery disease)    a. s/p DES to LAD in 05/2016  . Cervical radiculopathy   . Chronic diastolic CHF (congestive heart failure) ([place])   . Chronic pain   . Depression   . DVT (deep venous thrombosis) ([place])   . Hematemesis   . Hepatic steatosis   . Hyperlipidemia   . Hypertension   . IBS (irritable bowel syndrome)   . Morbid obesity ([place])   . OSA (obstructive sleep apnea)   . Pancreatitis   . PE (pulmonary thromboembolism) ([place])   . Peripheral neuropathy   . PUD (peptic ulcer disease)   . Renal disorder   . Stroke [facility])    a. ?details unclear - not seen on imaging when he was admitted in 05/2017 for TIA symptoms which were felt due to cervical radiculopathy.  . Thoracic aortic ectasia (HCC)    a. 4.3cm ectatic ascending thoracic aorta by CT 06/2017.   . Type 2 diabetes mellitus ([place])     Past Surgical History:  Procedure Laterality Date  . CARDIAC CATHETERIZATION N/A 05/31/2016   Procedure: Left Heart Cath and Coronary Angiography;  Surgeon: Peter M Martinique, MD;   Location: [place] CV LAB;  Service: Cardiovascular;  Laterality: N/A;  . CARDIAC CATHETERIZATION N/A 05/31/2016   Procedure: Intravascular Pressure Wire/FFR Study;  Surgeon: Peter M Martinique, MD;  Location: [place] [facility];  Service: Cardiovascular;  Laterality: N/A;  . CARDIAC CATHETERIZATION N/A 05/31/2016   Procedure: Coronary Stent Intervention;  Surgeon: Peter M Martinique, MD;  Location: [place] [facility];  Service: Cardiovascular;  Laterality: N/A;  . LEFT HEART CATH AND CORONARY ANGIOGRAPHY N/A 12/08/2017   Procedure: LEFT HEART CATH AND CORONARY ANGIOGRAPHY;  Surgeon: Leonie Man, MD;  Location: [place] [facility];  Service: Cardiovascular;  Laterality: N/A;  . LEFT HEART CATHETERIZATION WITH CORONARY ANGIOGRAM N/A 02/03/2014   Procedure: LEFT HEART CATHETERIZATION WITH CORONARY ANGIOGRAM;  Surgeon: Pixie Casino, MD;  Location: [facility] CATH LAB;  Service: Cardiovascular;  Laterality: N/A;  . left leg stent       There were no vitals filed for this visit.  Subjective Assessment - 11/01/18 0943    Subjective  10/10 in LT neck and arm . Pinched nerve. Saw Psychologist, sport and exercise . Injections and if not better  possible surgery.  Back moderate high today.    Pertinent History  Pain Score  7     Pain Location  Back    Pain Orientation  Right;Left;Lower    Pain Descriptors / Indicators  Aching    Pain Type  Chronic pain    Pain Onset  More than a month ago    Pain Frequency  Constant    Multiple Pain Sites  Yes    Pain Score  10    Pain Location  Neck    Pain Orientation  Left    Pain Descriptors / Indicators  Radiating    Pain Type  Chronic pain    Pain Radiating Towards  Lt arm    Pain Onset  More than a month ago    Pain Frequency  Constant    Aggravating Factors   using arm     Pain Relieving Factors  rest, meds                       [facility] Adult PT Treatment/Exercise - 11/01/18 0001      Lumbar Exercises: Stretches   Active Hamstring Stretch  Right;Left;10 seconds     Active Hamstring Stretch Limitations  SLR sitting then 30 sec stretch each    Single Knee to Chest Stretch  Right;Left;2 reps;30 seconds    Single Knee to Chest Stretch Limitations  sitting    Other Lumbar Stretch Exercise  rotation and side stretches of trunk 3 x 15 .        Lumbar Exercises: Aerobic   Nustep  L6   5 min   LE only      Lumbar Exercises: Standing   Row  Right;Left;20 reps    Theraband Level (Row)  Level 3 (Green)    Shoulder Extension  Right;Left;20 reps    Theraband Level (Shoulder Extension)  Level 3 (Green)    Other Standing Lumbar Exercises  and green band RT/Lt  cross chest pulls x20     Other Standing Lumbar Exercises  hip ext lean to counter x 12 RT/LT       Lumbar Exercises: Seated   Sit to Stand  15 reps    Other Seated Lumbar Exercises  Ball squeeze with abdominal draw x 15  and clam green band x 15 reps with ab draw                   PT Long Term Goals - 11/01/18 1014      PT LONG TERM GOAL #1   Title  Independent with HEP    Baseline  doing well with initial program and added exer    Status  Partially Met            Plan - 11/01/18 0933    Clinical Impression Statement  He reported back flet good post session an dlikes band exercises and has been doing them at home.   Continue core strenght and stretching    PT Treatment/Interventions  ADLs/Self Care Home Management;Cryotherapy;Electrical Stimulation;Ultrasound;Moist Heat;Traction;Neuromuscular re-education;Functional mobility training;Therapeutic activities;Therapeutic exercise;Patient/family education;Manual techniques;Dry needling    PT Next Visit Plan  Continue core strength and manual heat as needed.    maybe incr band to blue    PT Home Exercise Plan  pelvic tilts, SKTC, hip. add. isometrics, clamshell, hip bridges, hamstring and piriformis stretches, clam and reverse clam, ab set in standing , prone hip ext with pillows.   Row , extension snd cross chest pulls RT and LT green  band    Consulted and Agree  with Plan of Care  Patient       Patient will benefit from skilled therapeutic intervention in order to improve the following deficits and impairments:  Pain, Impaired sensation, Decreased activity tolerance, Decreased endurance, Decreased range of motion, Decreased strength, Obesity, Difficulty walking  Visit Diagnosis: Chronic bilateral low back pain without sciatica  Muscle weakness (generalized)  Abnormal posture     Problem List Patient Active Problem List   Diagnosis Date Noted  . Neck pain 10/09/2018  . Left arm weakness 10/09/2018  . B12 deficiency 08/10/2017  . Persistent headaches 08/09/2017  . GERD (gastroesophageal reflux disease) 06/29/2017  . Left arm numbness   . Cerebral embolism with cerebral infarction 06/17/2017  . TIA (transient ischemic attack) 06/17/2017  . Chronic back pain 12/29/2016  . Depression 12/15/2016  . Vitamin D deficiency 12/09/2016  . Right hip pain 12/07/2016  . Hypokalemia 12/07/2016  . Type 2 diabetes mellitus with vascular disease ([place]) 05/31/2016  . Normocytic normochromic anemia 05/31/2016  . Chest pain 05/31/2016  . CAD (coronary artery disease) 01/06/2016  . DVT (deep venous thrombosis) ([place]) 01/06/2016  . Lactic acidosis 05/28/2014  . Nonspecific chest pain 01/29/2014  . Uncontrolled secondary diabetes with peripheral neuropathy ([place]) 01/29/2014  . Obesity, Class III, BMI 40-49.9 (morbid obesity) ([place]) 01/29/2014  . Snoring 01/29/2014  . Dyslipidemia 01/29/2014  . HTN (hypertension) 01/29/2014  . Abnormal nuclear stress test 01/29/2014    Darrel Hoover  PT 11/01/2018, 10:15 AM  [facility] [address] [place], [place], [address] Phone: [phone]   Fax:  [phone]  Name: [name] MRN: [id number] Date of Birth: 1961/05/19

## 2018-11-06 ENCOUNTER — Ambulatory Visit: Payer: Medicaid Other | Admitting: Physical Therapy

## 2018-11-06 ENCOUNTER — Ambulatory Visit (HOSPITAL_COMMUNITY)
Admission: EM | Admit: 2018-11-06 | Discharge: 2018-11-06 | Disposition: A | Discharge status: Home / Self Care | Payer: Medicaid Other | Attending: Family Medicine | Admitting: Family Medicine

## 2018-11-06 ENCOUNTER — Encounter (HOSPITAL_COMMUNITY): Payer: Self-pay

## 2018-11-06 ENCOUNTER — Other Ambulatory Visit: Payer: Self-pay

## 2018-11-06 ENCOUNTER — Encounter: Payer: Self-pay | Admitting: Physical Therapy

## 2018-11-06 DIAGNOSIS — R293 Abnormal posture: Secondary | ICD-10-CM

## 2018-11-06 DIAGNOSIS — I10 Essential (primary) hypertension: Secondary | ICD-10-CM

## 2018-11-06 DIAGNOSIS — E669 Obesity, unspecified: Secondary | ICD-10-CM

## 2018-11-06 DIAGNOSIS — Z6841 Body Mass Index (BMI) 40.0 and over, adult: Secondary | ICD-10-CM

## 2018-11-06 DIAGNOSIS — R0789 Other chest pain: Secondary | ICD-10-CM | POA: Diagnosis not present

## 2018-11-06 DIAGNOSIS — M545 Low back pain, unspecified: Secondary | ICD-10-CM

## 2018-11-06 DIAGNOSIS — J029 Acute pharyngitis, unspecified: Secondary | ICD-10-CM | POA: Insufficient documentation

## 2018-11-06 DIAGNOSIS — G8929 Other chronic pain: Secondary | ICD-10-CM

## 2018-11-06 DIAGNOSIS — M6281 Muscle weakness (generalized): Secondary | ICD-10-CM

## 2018-11-06 LAB — POCT RAPID STREP A: Streptococcus, Group A Screen (Direct): NEGATIVE

## 2018-11-06 MED ORDER — ALBUTEROL SULFATE 108 (90 BASE) MCG/ACT IN AEPB: 2.0000 | INHALATION_SPRAY | Freq: Four times a day (QID) | RESPIRATORY_TRACT | 0 refills | Status: DC | PRN

## 2018-11-06 MED ORDER — ALBUTEROL SULFATE (2.5 MG/3ML) 0.083% IN NEBU
2.5000 mg | INHALATION_SOLUTION | Freq: Once | RESPIRATORY_TRACT | Status: AC
  Administered 2018-11-06: 2.5 mg via RESPIRATORY_TRACT

## 2018-11-06 MED ORDER — ALBUTEROL SULFATE (2.5 MG/3ML) 0.083% IN NEBU
INHALATION_SOLUTION | RESPIRATORY_TRACT | Status: AC
  Filled 2018-11-06: qty 3

## 2018-11-06 NOTE — ED Provider Notes (Addendum)
[name]    CSN: [id number] Arrival date & time: 11/06/18  1344     History   Chief Complaint Chief Complaint  Patient presents with  . Sore Throat    HPI [name] is a 58 y.o. male.   This is a 58 year old man who presents an established patient at [facility] urgent care.  He complains about  sore throat. Pt  states his throat feels like its closing up this started last night.  Chest discomfort that comes and goes.left arm numbness and chest discomfort has been going on for several months.  No chest pain or shortness of breath no fever, no sore throat     Past Medical History:  Diagnosis Date  . Anemia   . Arthritis   . Back pain   . CAD (coronary artery disease)    a. s/p DES to LAD in 05/2016  . Cervical radiculopathy   . Chronic diastolic CHF (congestive heart failure) ([place])   . Chronic pain   . Depression   . DVT (deep venous thrombosis) ([place])   . Hematemesis   . Hepatic steatosis   . Hyperlipidemia   . Hypertension   . IBS (irritable bowel syndrome)   . Morbid obesity ([place])   . OSA (obstructive sleep apnea)   . Pancreatitis   . PE (pulmonary thromboembolism) ([place])   . Peripheral neuropathy   . PUD (peptic ulcer disease)   . Renal disorder   . Stroke [facility])    a. ?details unclear - not seen on imaging when he was admitted in 05/2017 for TIA symptoms which were felt due to cervical radiculopathy.  . Thoracic aortic ectasia (HCC)    a. 4.3cm ectatic ascending thoracic aorta by CT 06/2017.   . Type 2 diabetes mellitus [facility])     Patient Active Problem List   Diagnosis Date Noted  . Neck pain 10/09/2018  . Left arm weakness 10/09/2018  . B12 deficiency 08/10/2017  . Persistent headaches 08/09/2017  . GERD (gastroesophageal reflux disease) 06/29/2017  . Left arm numbness   . Cerebral embolism with cerebral infarction 06/17/2017  . TIA (transient ischemic attack) 06/17/2017  . Chronic back pain 12/29/2016  . Depression 12/15/2016    . Vitamin D deficiency 12/09/2016  . Right hip pain 12/07/2016  . Hypokalemia 12/07/2016  . Type 2 diabetes mellitus with vascular disease ([place]) 05/31/2016  . Normocytic normochromic anemia 05/31/2016  . Chest pain 05/31/2016  . CAD (coronary artery disease) 01/06/2016  . DVT (deep venous thrombosis) ([place]) 01/06/2016  . Lactic acidosis 05/28/2014  . Nonspecific chest pain 01/29/2014  . Uncontrolled secondary diabetes with peripheral neuropathy ([place]) 01/29/2014  . Obesity, Class III, BMI 40-49.9 (morbid obesity) ([place]) 01/29/2014  . Snoring 01/29/2014  . Dyslipidemia 01/29/2014  . HTN (hypertension) 01/29/2014  . Abnormal nuclear stress test 01/29/2014    Past Surgical History:  Procedure Laterality Date  . CARDIAC CATHETERIZATION N/A 05/31/2016   Procedure: Left Heart Cath and Coronary Angiography;  Surgeon: Peter M Martinique, MD;  Location: [place] [facility];  Service: Cardiovascular;  Laterality: N/A;  . CARDIAC CATHETERIZATION N/A 05/31/2016   Procedure: Intravascular Pressure Wire/FFR Study;  Surgeon: Peter M Martinique, MD;  Location: [place] [facility];  Service: Cardiovascular;  Laterality: N/A;  . CARDIAC CATHETERIZATION N/A 05/31/2016   Procedure: Coronary Stent Intervention;  Surgeon: Peter M Martinique, MD;  Location: [place] CV LAB;  Service: Cardiovascular;  Laterality: N/A;  . LEFT HEART CATH AND CORONARY ANGIOGRAPHY  N/A 12/08/2017   Procedure: LEFT HEART CATH AND CORONARY ANGIOGRAPHY;  Surgeon: Leonie Man, MD;  Location: [place] CV LAB;  Service: Cardiovascular;  Laterality: N/A;  . LEFT HEART CATHETERIZATION WITH CORONARY ANGIOGRAM N/A 02/03/2014   Procedure: LEFT HEART CATHETERIZATION WITH CORONARY ANGIOGRAM;  Surgeon: Pixie Casino, MD;  Location: [facility] CATH LAB;  Service: Cardiovascular;  Laterality: N/A;  . left leg stent          Home Medications    Prior to Admission medications   Medication Sig Start Date End Date Taking? Authorizing Provider  Albuterol  Sulfate (PROAIR RESPICLICK) 254 (90 Base) MCG/ACT AEPB Inhale 2 puffs into the lungs every 6 (six) hours as needed. 11/06/18   Robyn Haber, MD  aspirin 81 MG chewable tablet Chew 1 tablet (81 mg total) by mouth daily. 06/01/16   Elwin Mocha, MD  atorvastatin (LIPITOR) 80 MG tablet Take 1 tablet (80 mg total) by mouth daily. Patient taking differently: Take 80 mg by mouth daily at 6 PM.  01/26/17   Ahmed Prima, Fransisco Hertz, PA-C  carvedilol (COREG) 25 MG tablet Take 1 tablet (25 mg total) by mouth 2 (two) times daily with a meal. 01/26/17   Strader, [place] M, PA-C  dapagliflozin propanediol (FARXIGA) 5 MG TABS tablet Take 5 mg by mouth daily.    [provider]  diclofenac sodium (VOLTAREN) 1 % GEL Apply 1 application topically 4 (four) times daily. 04/05/18   Melynda Ripple, MD  dicyclomine (BENTYL) 20 MG tablet Take 1 tablet (20 mg total) by mouth 2 (two) times daily. 06/06/18   Palumbo, April, MD  DULoxetine (CYMBALTA) 60 MG capsule Take 1 capsule (60 mg total) by mouth daily. 03/08/18   Marcial Pacas, MD  famotidine (PEPCID) 40 MG tablet Take 1 tablet (40 mg total) by mouth at bedtime. Patient taking differently: Take 40 mg by mouth daily.  06/18/17   Barton Dubois, MD  furosemide (LASIX) 40 MG tablet Take 1 tablet (40 mg total) by mouth every evening. Patient taking differently: Take 40 mg by mouth 3 (three) times daily.  12/09/17   Velvet Bathe, MD  glipiZIDE (GLUCOTROL) 10 MG tablet Take 10 mg by mouth 2 (two) times daily. 05/30/17   [provider]  hydrALAZINE (APRESOLINE) 25 MG tablet Take 3 tablets (75 mg total) by mouth 3 (three) times daily. 07/19/18   Lelon Perla, MD  Insulin Detemir (LEVEMIR) 100 UNIT/ML Pen Inject 20 Units into the skin daily at 10 pm. Patient taking differently: Inject 55 Units into the skin daily at 10 pm.  06/18/17   Barton Dubois, MD  Insulin Pen Needle 31G X 5 MM MISC Use 1 needle daily to inject insulin as prescribed 06/18/17   Barton Dubois,  MD  isosorbide dinitrate (ISORDIL) 30 MG tablet Take 1 tablet (30 mg total) by mouth every morning. 12/10/17   Velvet Bathe, MD  isosorbide mononitrate (IMDUR) 30 MG 24 hr tablet Take 30 mg by mouth daily. 06/27/18   [provider]  lisinopril (PRINIVIL,ZESTRIL) 10 MG tablet Take 1 tablet (10 mg total) by mouth daily. Patient taking differently: Take 40 mg by mouth daily.  12/10/17   Velvet Bathe, MD  LYRICA 100 MG capsule Take 1 capsule (100 mg total) by mouth 2 (two) times daily. 10/09/18   Marcial Pacas, MD  metFORMIN (GLUCOPHAGE-XR) 500 MG 24 hr tablet Take 2 tablets (1,000 mg total) by mouth 2 (two) times daily. 07/02/17   Barton Dubois, MD  nitroGLYCERIN (  NITROSTAT) 0.4 MG SL tablet Place 0.4 mg under the tongue every 5 (five) minutes as needed for chest pain. 12/17/16   [provider]  oxyCODONE (ROXICODONE) 15 MG immediate release tablet Take 15 mg 3 (three) times daily as needed by mouth for pain.  08/28/17   [provider]  pantoprazole (PROTONIX) 40 MG tablet Take 1 tablet (40 mg total) by mouth 2 (two) times daily. 06/18/17   Barton Dubois, MD  potassium chloride (MICRO-K) 10 MEQ CR capsule Take 20 mEq by mouth 3 (three) times daily with meals. 20 meq at lunch, 10 meq at dinner and 10 meq at bedtime 06/28/18   [provider]  potassium chloride SA (K-DUR,KLOR-CON) 20 MEQ tablet Take 20-40 mEq by mouth daily. 40 meq in the lunch, 20 med at dinner and 20 meq at bedtime    [provider]  sertraline (ZOLOFT) 50 MG tablet Take 50 mg by mouth daily.    [provider]  tiZANidine (ZANAFLEX) 2 MG tablet Take 2 mg by mouth 2 (two) times daily as needed for pain.    [provider]  traZODone (DESYREL) 50 MG tablet Take 1 tablet (50 mg total) by mouth at bedtime. 12/15/16   Florinda Marker, MD    Family History Family History  Problem Relation Age of Onset  . Cancer Father   . Hypertension Mother   . Diabetes Mother   . Breast cancer  Mother   . Hypertension Brother   . Diabetes Brother   . Hypertension Sister   . Diabetes Sister     Social History Social History   Tobacco Use  . Smoking status: Never Smoker  . Smokeless tobacco: Never Used  Substance Use Topics  . Alcohol use: No  . Drug use: No     Allergies   Coconut flavor [flavoring agent]; Coconut oil; Ibuprofen; Aleve [naproxen]; and Nsaids   Review of Systems Review of Systems   Physical Exam Triage Vital Signs ED Triage Vitals  Enc Vitals Group     BP 11/06/18 1415 (!) 178/90     Pulse Rate 11/06/18 1415 63     Resp 11/06/18 1415 18     Temp 11/06/18 1415 97.8 F (36.6 C)     Temp src --      SpO2 11/06/18 1415 98 %     Weight 11/06/18 1417 (!) 306 lb (138.8 kg)     Height --      Head Circumference --      Peak Flow --      Pain Score 11/06/18 1417 9     Pain Loc --      Pain Edu? --      Excl. in [place]? --    No data found.  Updated Vital Signs BP (!) 178/90 (BP Location: Right Arm)   Pulse 63   Temp 97.8 F (36.6 C)   Resp 18   Wt (!) 138.8 kg   SpO2 98%   BMI 43.91 kg/m    Physical Exam Vitals signs and nursing note reviewed.  Constitutional:      Appearance: He is well-developed. He is obese.  HENT:     Head: Normocephalic and atraumatic.     Right Ear: Tympanic membrane normal.     Left Ear: Tympanic membrane normal.     Nose: Congestion present.     Mouth/Throat:     Mouth: Mucous membranes are moist.     Pharynx: Uvula midline.  Tonsils: Swelling: 2+ on the right. 2+ on the left.  Neck:     Musculoskeletal: Normal range of motion and neck supple.  Pulmonary:     Effort: Pulmonary effort is normal.     Breath sounds: Normal breath sounds.  Skin:    General: Skin is warm and dry.  Neurological:     General: No focal deficit present.     Mental Status: He is alert.  Psychiatric:        Mood and Affect: Mood normal.   Nebulizer treatment was ordered to see whether tightness in chest would resolve  with this treatment.  He reports the chest discomfort resolved with the treatment   UC Treatments / Results  Labs (all labs ordered are listed, but only abnormal results are displayed) Labs Reviewed  CULTURE, GROUP A STREP [facility])  POCT RAPID STREP A    EKG Normal sinus rhythm without worrisome abnormality Radiology No results found.  Procedures Procedures (including critical care time)  Medications Ordered in UC Medications  albuterol (PROVENTIL) (2.5 MG/3ML) 0.083% nebulizer solution 2.5 mg (has no administration in time range)    Initial Impression / Assessment and Plan / UC Course  I have reviewed the triage vital signs and the nursing notes.  Pertinent labs & imaging results that were available during my care of the patient were reviewed by me and considered in my medical decision making (see chart for details).    Final Clinical Impressions(s) / UC Diagnoses   Final diagnoses:  Pharyngitis, unspecified etiology   Discharge Instructions   None    ED Prescriptions    Medication Sig Dispense Auth. Provider   Albuterol Sulfate (PROAIR RESPICLICK) 023 (90 Base) MCG/ACT AEPB Inhale 2 puffs into the lungs every 6 (six) hours as needed. 1 each Robyn Haber, MD     Controlled Substance Prescriptions [place] Controlled Substance Registry consulted? Not Applicable   Robyn Haber, MD 11/06/18 [place], Aamori Mcmasters, MD 12/05/18 1530

## 2018-11-06 NOTE — ED Notes (Signed)
Patient at [facility] for throat concern.  Throat feeling different since last night.  No new meds, throat feels slightly "tight".  No sob, able to talk without difficulty.  Patient swallows liquids without difficulty.  Patient reports he has not tried to swallow solids, feels like"something might be in the way".    Patient has cardiac history and left arm numbness that is being worked up by specialist.    Patient made aware the ED would be the most appropriate.  Patient not willing to go .  Explained that provider can talk and examine him, but we may not have the available tests to determine diagnosi.

## 2018-11-06 NOTE — Therapy (Signed)
[place], [place], [address] Phone: [phone]   Fax:  [phone]  Physical Therapy Treatment  Patient Details  Name: [name] MRN: [id number] Date of Birth: January 10, 1961 Referring Provider (PT): Elenore Paddy, FNP-C   Encounter Date: 11/06/2018  PT End of Session - 11/06/18 0950    Visit Number  7    Number of Visits  15    Date for PT Re-Evaluation  12/11/18    Authorization Type  Medicaid    Authorization Time Period  through 11/28/18    Authorization - Visit Number  3    Authorization - Number of Visits  3    PT Start Time  0923    PT Stop Time  1001    PT Time Calculation (min)  38 min    Activity Tolerance  --   limited tolerance supine position due to cough today so more focus seated + standing activities   Behavior During Therapy  [facility] for tasks assessed/performed       Past Medical History:  Diagnosis Date  . Anemia   . Arthritis   . Back pain   . CAD (coronary artery disease)    a. s/p DES to LAD in 05/2016  . Cervical radiculopathy   . Chronic diastolic CHF (congestive heart failure) ([place])   . Chronic pain   . Depression   . DVT (deep venous thrombosis) ([place])   . Hematemesis   . Hepatic steatosis   . Hyperlipidemia   . Hypertension   . IBS (irritable bowel syndrome)   . Morbid obesity ([place])   . OSA (obstructive sleep apnea)   . Pancreatitis   . PE (pulmonary thromboembolism) ([place])   . Peripheral neuropathy   . PUD (peptic ulcer disease)   . Renal disorder   . Stroke [facility])    a. ?details unclear - not seen on imaging when he was admitted in 05/2017 for TIA symptoms which were felt due to cervical radiculopathy.  . Thoracic aortic ectasia (HCC)    a. 4.3cm ectatic ascending thoracic aorta by CT 06/2017.   . Type 2 diabetes mellitus ([place])     Past Surgical History:  Procedure Laterality Date  . CARDIAC CATHETERIZATION N/A 05/31/2016   Procedure: Left Heart Cath and  Coronary Angiography;  Surgeon: Peter M Martinique, MD;  Location: [place] CV LAB;  Service: Cardiovascular;  Laterality: N/A;  . CARDIAC CATHETERIZATION N/A 05/31/2016   Procedure: Intravascular Pressure Wire/FFR Study;  Surgeon: Peter M Martinique, MD;  Location: [place] [facility];  Service: Cardiovascular;  Laterality: N/A;  . CARDIAC CATHETERIZATION N/A 05/31/2016   Procedure: Coronary Stent Intervention;  Surgeon: Peter M Martinique, MD;  Location: [place] [facility];  Service: Cardiovascular;  Laterality: N/A;  . LEFT HEART CATH AND CORONARY ANGIOGRAPHY N/A 12/08/2017   Procedure: LEFT HEART CATH AND CORONARY ANGIOGRAPHY;  Surgeon: Leonie Man, MD;  Location: [place] [facility];  Service: Cardiovascular;  Laterality: N/A;  . LEFT HEART CATHETERIZATION WITH CORONARY ANGIOGRAM N/A 02/03/2014   Procedure: LEFT HEART CATHETERIZATION WITH CORONARY ANGIOGRAM;  Surgeon: Pixie Casino, MD;  Location: [facility] C[facility] LAB;  Service: Cardiovascular;  Laterality: N/A;  . left leg stent       There were no vitals filed for this visit.  Subjective Assessment - 11/06/18 0925    Subjective  LBP 5/10 this morning (with medication). Pt. reports therapy and HEP have been helping with LBP. Improving positional tolerance with  decreased LBP but still limited with bending, lifting activities for chores.    Pertinent History  chronic LBP history, left cervical radiculopathy pending injections    Limitations  Sitting;Standing;Walking;Lifting;House hold activities    How long can you sit comfortably?  up to an hour    How long can you stand comfortably?  30-40 min "on a good day"    How long can you walk comfortably?  30 minutes/variable-at times can walk longer    Diagnostic tests  MRI per pt. report, X-rays    Patient Stated Goals  "Walk straight", "Run again", play with grandson    Currently in Pain?  Yes    Pain Score  5     Pain Location  Back    Pain Orientation  Right;Left;Lower    Pain Descriptors / Indicators   Aching    Pain Type  Chronic pain    Pain Onset  More than a month ago    Pain Frequency  Constant    Aggravating Factors   activity    Pain Relieving Factors  medication    Effect of Pain on Daily Activities  limits positional tolerance for sitting, standing, ability for lifting for chores    Pain Score  7    Pain Location  Neck    Pain Orientation  Left    Pain Descriptors / Indicators  Radiating    Pain Type  Chronic pain    Pain Radiating Towards  left arm    Pain Onset  More than a month ago    Pain Frequency  Constant    Aggravating Factors   activity, arm use    Pain Relieving Factors  medication         [facility] PT Assessment - 11/06/18 0001      AROM   Right Hip Flexion  104    Right Hip External Rotation   34    Right Hip Internal Rotation   5    Right Hip ABduction  30    Lumbar Flexion  75    Lumbar Extension  20    Lumbar - Right Side Bend  24    Lumbar - Left Side Bend  25    Lumbar - Right Rotation  60%    Lumbar - Left Rotation  70%      Strength   Right Hip Flexion  4+/5    Right Hip Extension  4+/5    Right Hip External Rotation   5/5    Right Hip Internal Rotation  5/5    Right Hip ABduction  5/5    Left Hip Flexion  5/5    Left Hip Extension  5/5    Left Hip External Rotation  5/5    Left Hip Internal Rotation  5/5    Left Hip ABduction  5/5    Right Knee Flexion  5/5    Right Knee Extension  5/5    Left Knee Flexion  5/5    Left Knee Extension  5/5    Right Ankle Dorsiflexion  5/5    Right Ankle Inversion  5/5    Right Ankle Eversion  5/5    Left Ankle Dorsiflexion  5/5    Left Ankle Inversion  5/5    Left Ankle Eversion  5/5                   [facility] Adult PT Treatment/Exercise - 11/06/18 0001      Lumbar Exercises: Stretches  Passive Hamstring Stretch  Right;Left;3 reps;30 seconds    Single Knee to Chest Stretch  Right;Left;3 reps;20 seconds    Single Knee to Chest Stretch Limitations  with strap    Piriformis Stretch   Right;Left;2 reps;30 seconds      Lumbar Exercises: Aerobic   Nustep  L4 x 5 min LE only      Lumbar Exercises: Standing   Row  Right;Left;20 reps    Theraband Level (Row)  Level 4 (Blue)    Shoulder Extension  Right;Left;20 reps    Theraband Level (Shoulder Extension)  Level 4 (Blue)    Other Standing Lumbar Exercises  Pall of press blue band x 15 ea. way      Lumbar Exercises: Seated   Other Seated Lumbar Exercises  ball squeeze with abd. bracing 3-5 sec holds x 15 reps    Other Seated Lumbar Exercises  seated clamshell green 2x10      Lumbar Exercises: Supine   Bridge with [employer]  15 reps    Bridge with [employer] Limitations  partial ROM             PT Education - 11/06/18 0949    Education Details  POC    Person(s) Educated  Patient    Methods  Explanation    Comprehension  Verbalized understanding          PT Long Term Goals - 11/06/18 0943      PT LONG TERM GOAL #1   Title  Independent with HEP    Baseline  met, will further update prn    Time  4    Period  Weeks    Status  Achieved    Target Date  11/28/18      PT LONG TERM GOAL #2   Title  Tolerate sitting 30 min or greater for eating meals, car travel    Baseline  met    Time  4    Period  Weeks    Target Date  11/28/18      PT LONG TERM GOAL #3   Title  Tolerate walking 35-40 min for grocery shopping, IADLs    Baseline  improved but not consistently met    Time  4    Period  Weeks    Status  On-going    Target Date  12/11/18      PT LONG TERM GOAL #4   Title  Right hip strength 5/5 to improve ability for lifting for chores at home    Baseline  4+/5 flex and ext    Time  4    Period  Weeks    Status  New    Target Date  12/11/18      PT LONG TERM GOAL #5   Title  Increase trunk flexion AROM 5-10 deg to improve ability for bending for lifting for chores, donning shoes    Baseline  75 deg    Time  4    Period  Weeks    Status  New    Target Date  12/11/18             Plan - 11/06/18 0950    Clinical Impression Statement  Pt. has made moderate improvement with therapy for decreased LBP with objective improvements for strength gains, associated functional gains for positional tolerance for standing and sitting as well as walking tolerance from baseline status. Pt. continues with functional limitations for bending and lifting activities. Status and speed of progress  impacted by chronic LBP history and comorbidities with cervical radiculopathy pending injections next week. Pt. would benefit form continued PT for further progress to address remaining functional limitations.    History and Personal Factors relevant to plan of care:  underlying degenerative changes, cardiac and CVA history, chronic pain history, past PT, comorbidities with cervical radiculopathy    Clinical Presentation  Stable    Clinical Decision Making  Low    Clinical Impairments Affecting Rehab Potential  chronic pain history, past therapy, comorbidities    PT Frequency  2x / week    PT Duration  4 weeks    PT Treatment/Interventions  ADLs/Self Care Home Management;Cryotherapy;Electrical Stimulation;Ultrasound;Moist Heat;Traction;Neuromuscular re-education;Functional mobility training;Therapeutic activities;Therapeutic exercise;Patient/family education;Manual techniques;Dry needling;Taping    PT Next Visit Plan  Continue core strength and manual heat as needed.  Check for innominate rotation and perform METs prn to address (right anterior)-unable to tolerate supine positioning for METs today due to cough    PT Home Exercise Plan  pelvic tilts, SKTC, hip. add. isometrics, clamshell, hip bridges, hamstring and piriformis stretches, clam and reverse clam, ab set in standing , prone hip ext with pillows.   Row , extension snd cross chest pulls RT and LT green band    Consulted and Agree with Plan of Care  Patient       Patient will benefit from skilled therapeutic intervention in order  to improve the following deficits and impairments:  Pain, Impaired sensation, Decreased activity tolerance, Decreased endurance, Decreased range of motion, Decreased strength, Obesity, Difficulty walking  Visit Diagnosis: Chronic bilateral low back pain without sciatica  Muscle weakness (generalized)  Abnormal posture     Problem List Patient Active Problem List   Diagnosis Date Noted  . Neck pain 10/09/2018  . Left arm weakness 10/09/2018  . B12 deficiency 08/10/2017  . Persistent headaches 08/09/2017  . GERD (gastroesophageal reflux disease) 06/29/2017  . Left arm numbness   . Cerebral embolism with cerebral infarction 06/17/2017  . TIA (transient ischemic attack) 06/17/2017  . Chronic back pain 12/29/2016  . Depression 12/15/2016  . Vitamin D deficiency 12/09/2016  . Right hip pain 12/07/2016  . Hypokalemia 12/07/2016  . Type 2 diabetes mellitus with vascular disease ([place]) 05/31/2016  . Normocytic normochromic anemia 05/31/2016  . Chest pain 05/31/2016  . CAD (coronary artery disease) 01/06/2016  . DVT (deep venous thrombosis) ([place]) 01/06/2016  . Lactic acidosis 05/28/2014  . Nonspecific chest pain 01/29/2014  . Uncontrolled secondary diabetes with peripheral neuropathy ([place]) 01/29/2014  . Obesity, Class III, BMI 40-49.9 (morbid obesity) ([place]) 01/29/2014  . Snoring 01/29/2014  . Dyslipidemia 01/29/2014  . HTN (hypertension) 01/29/2014  . Abnormal nuclear stress test 01/29/2014    Beaulah Dinning, PT, DPT 11/06/18 10:04 AM  [facility] [address] [place], [place], [address] Phone: [phone]   Fax:  [phone]  Name: [name] MRN: [id number] Date of Birth: March 29, 1961

## 2018-11-06 NOTE — ED Triage Notes (Signed)
Pt cc sore throat. Pt  states his throat feels like its closing up this started last night.  Chest discomfort that comes and goes.left arm numbness and chest discomfort has been going on for several months.

## 2018-11-07 ENCOUNTER — Other Ambulatory Visit: Payer: Self-pay

## 2018-11-07 ENCOUNTER — Encounter (HOSPITAL_COMMUNITY): Payer: Self-pay

## 2018-11-07 ENCOUNTER — Emergency Department (HOSPITAL_COMMUNITY)
Admission: EM | Admit: 2018-11-07 | Discharge: 2018-11-07 | Disposition: A | Discharge status: Home / Self Care | Payer: Medicaid Other | Attending: Emergency Medicine | Admitting: Emergency Medicine

## 2018-11-07 ENCOUNTER — Emergency Department (HOSPITAL_COMMUNITY): Payer: Medicaid Other

## 2018-11-07 DIAGNOSIS — E119 Type 2 diabetes mellitus without complications: Secondary | ICD-10-CM | POA: Diagnosis not present

## 2018-11-07 DIAGNOSIS — Z79899 Other long term (current) drug therapy: Secondary | ICD-10-CM | POA: Diagnosis not present

## 2018-11-07 DIAGNOSIS — I110 Hypertensive heart disease with heart failure: Secondary | ICD-10-CM | POA: Diagnosis not present

## 2018-11-07 DIAGNOSIS — Z794 Long term (current) use of insulin: Secondary | ICD-10-CM | POA: Insufficient documentation

## 2018-11-07 DIAGNOSIS — Z7982 Long term (current) use of aspirin: Secondary | ICD-10-CM | POA: Diagnosis not present

## 2018-11-07 DIAGNOSIS — M7918 Myalgia, other site: Secondary | ICD-10-CM

## 2018-11-07 DIAGNOSIS — R55 Syncope and collapse: Secondary | ICD-10-CM | POA: Diagnosis present

## 2018-11-07 DIAGNOSIS — I5032 Chronic diastolic (congestive) heart failure: Secondary | ICD-10-CM | POA: Diagnosis not present

## 2018-11-07 DIAGNOSIS — M791 Myalgia, unspecified site: Secondary | ICD-10-CM | POA: Diagnosis not present

## 2018-11-07 LAB — CBC WITH DIFFERENTIAL/PLATELET
Abs Immature Granulocytes: 0.03 10*3/uL (ref 0.00–0.07)
Basophils Absolute: 0 10*3/uL (ref 0.0–0.1)
Basophils Relative: 0 %
Eosinophils Absolute: 0.1 10*3/uL (ref 0.0–0.5)
Eosinophils Relative: 1 %
HCT: 33.4 % — ABNORMAL LOW (ref 39.0–52.0)
Hemoglobin: 10.2 g/dL — ABNORMAL LOW (ref 13.0–17.0)
Immature Granulocytes: 0 %
Lymphocytes Relative: 21 %
Lymphs Abs: 1.9 10*3/uL (ref 0.7–4.0)
MCH: 27.4 pg (ref 26.0–34.0)
MCHC: 30.5 g/dL (ref 30.0–36.0)
MCV: 89.8 fL (ref 80.0–100.0)
Monocytes Absolute: 0.7 10*3/uL (ref 0.1–1.0)
Monocytes Relative: 8 %
Neutro Abs: 6.3 10*3/uL (ref 1.7–7.7)
Neutrophils Relative %: 70 %
Platelets: 250 10*3/uL (ref 150–400)
RBC: 3.72 MIL/uL — ABNORMAL LOW (ref 4.22–5.81)
RDW: 14 % (ref 11.5–15.5)
WBC: 9.1 10*3/uL (ref 4.0–10.5)
nRBC: 0 % (ref 0.0–0.2)

## 2018-11-07 LAB — BASIC METABOLIC PANEL
Anion gap: 9 (ref 5–15)
BUN: 9 mg/dL (ref 6–20)
CO2: 25 mmol/L (ref 22–32)
Calcium: 8.8 mg/dL — ABNORMAL LOW (ref 8.9–10.3)
Chloride: 107 mmol/L (ref 98–111)
Creatinine, Ser: 0.85 mg/dL (ref 0.61–1.24)
GFR calc Af Amer: >60 mL/min (ref >60)
GFR calc non Af Amer: >60 mL/min (ref >60)
Glucose, Bld: 120 mg/dL — ABNORMAL HIGH (ref 70–99)
Potassium: 3.4 mmol/L — ABNORMAL LOW (ref 3.5–5.1)
Sodium: 141 mmol/L (ref 135–145)

## 2018-11-07 MED ORDER — OXYCODONE HCL 5 MG PO TABS
10.0000 mg | ORAL_TABLET | Freq: Once | ORAL | Status: AC
  Administered 2018-11-07: 10 mg via ORAL
  Filled 2018-11-07: qty 2

## 2018-11-07 NOTE — ED Notes (Signed)
Splint placed. Pt verbalizes understanding of directions with device
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
## 2018-11-07 NOTE — ED Notes (Signed)
ED Provider at bedside. 

## 2018-11-07 NOTE — ED Notes (Signed)
Patient transported to X-ray 

## 2018-11-07 NOTE — Discharge Instructions (Signed)
Please read attached information. If you experience any new or worsening signs or symptoms please return to the emergency room for evaluation. Please follow-up with your primary care provider or specialist as discussed. Please use home medication as needed for pain.

## 2018-11-08 ENCOUNTER — Encounter: Payer: Medicaid Other | Admitting: Physical Therapy

## 2018-11-08 ENCOUNTER — Telehealth: Payer: Self-pay | Admitting: Neurology
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
## 2018-11-09 LAB — CULTURE, GROUP A STREP (THRC)

## 2018-11-13 ENCOUNTER — Encounter: Payer: Medicaid Other | Admitting: Physical Therapy

## 2018-11-13 ENCOUNTER — Ambulatory Visit
Admission: RE | Admit: 2018-11-13 | Discharge: 2018-11-13 | Disposition: A | Discharge status: Home / Self Care | Payer: Medicaid Other | Source: Ambulatory Visit | Attending: Neurological Surgery | Admitting: Neurological Surgery

## 2018-11-13 DIAGNOSIS — M5412 Radiculopathy, cervical region: Secondary | ICD-10-CM

## 2018-11-13 MED ORDER — TRIAMCINOLONE ACETONIDE 40 MG/ML IJ SUSP (RADIOLOGY)
60.0000 mg | Freq: Once | INTRAMUSCULAR | Status: AC
  Administered 2018-11-13: 60 mg via EPIDURAL

## 2018-11-13 MED ORDER — IOPAMIDOL (ISOVUE-M 300) INJECTION 61%
1.0000 mL | Freq: Once | INTRAMUSCULAR | Status: AC | PRN
  Administered 2018-11-13: 1 mL via EPIDURAL

## 2018-11-13 NOTE — Discharge Instructions (Signed)

## 2018-11-15 ENCOUNTER — Telehealth: Payer: Self-pay | Admitting: Neurology

## 2018-11-15 ENCOUNTER — Encounter: Payer: Medicaid Other | Admitting: Physical Therapy
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
## 2018-11-19 ENCOUNTER — Ambulatory Visit: Payer: Medicaid Other | Admitting: Neurology

## 2018-11-19 ENCOUNTER — Encounter: Payer: Self-pay | Admitting: Neurology

## 2018-11-19 VITALS — BP 123/65 | HR 80 | Ht 70.0 in | Wt 303.0 lb

## 2018-11-19 DIAGNOSIS — R404 Transient alteration of awareness: Secondary | ICD-10-CM | POA: Diagnosis not present
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
## 2018-11-20 ENCOUNTER — Encounter: Payer: Medicaid Other | Admitting: Physical Therapy

## 2018-11-20 ENCOUNTER — Ambulatory Visit: Payer: Medicaid Other | Admitting: Neurology

## 2018-11-20 DIAGNOSIS — R55 Syncope and collapse: Secondary | ICD-10-CM

## 2018-11-20 DIAGNOSIS — R404 Transient alteration of awareness: Secondary | ICD-10-CM

## 2018-11-22 ENCOUNTER — Encounter: Payer: Medicaid Other | Admitting: Physical Therapy

## 2018-11-23 ENCOUNTER — Ambulatory Visit (INDEPENDENT_AMBULATORY_CARE_PROVIDER_SITE_OTHER): Payer: Medicaid Other | Admitting: Neurology

## 2018-11-23 ENCOUNTER — Encounter: Payer: Medicaid Other | Admitting: Neurology

## 2018-11-23 DIAGNOSIS — M542 Cervicalgia: Secondary | ICD-10-CM | POA: Diagnosis not present

## 2018-11-23 DIAGNOSIS — R29898 Other symptoms and signs involving the musculoskeletal system: Secondary | ICD-10-CM

## 2018-11-23 DIAGNOSIS — Z0289 Encounter for other administrative examinations: Secondary | ICD-10-CM

## 2018-11-23 NOTE — Procedures (Signed)
Full Name: Teague Goynes Gender: Male MRN #: [id number] Date of Birth: Mar 25, 1961    Visit Date: 11/23/2018 11:53 Age: 58 Years 2 Months Old Examining Physician: Marcial Pacas, MD  Referring Physician: Marcial Pacas, MD History: 58 year old male presented with chronic neck pain radiating pain to left upper extremity  Summary of the tests:  Nerve conduction study: Left sural, superficial peroneal, median, ulnar sensory responses were absent.  Right median, ulnar sensory responses were also absent.  Left peroneal to EDB, tibial motor responses were absent.  Left ulnar motor responses showed no significant abnormality.  Bilateral median motor responses showed significantly prolonged distal latency, left worse than right, with normal C map amplitude, conduction velocity.  Right ulnar motor response showed normal distal latency, C map amplitude at distal stimulation sites, but was not able to elicit any motor response at right below elbow and above elbow sites.  It was technically difficult study due to patient's big body habitus, and very thick callused skin  Electromyography:  Selective needle examination of left upper extremity and left cervical paraspinals showed no significant abnormality.    Conclusion: This is an abnormal study.  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  In addition, there is evidence of moderate bilateral carpal tunnel syndromes.  There is no evidence of left cervical radiculopathy.    ------------------------------- Marcial Pacas, M.D. PhD  [facility] [place], [place] [address] Tel: [phone] Fax: [phone]        [facility]    Nerve / Sites Muscle Latency Ref. Amplitude Ref. Rel Amp Segments Distance Velocity Ref. Area    ms ms mV mV %  cm m/s m/s mVms  L Median - APB     Wrist APB 8.9 ?4.4 4.6 ?4.0 100 Wrist - APB 7   20.0     Upper arm APB 13.4  4.2  92.7  Upper arm - Wrist 22 49 ?49 20.4  R Median - APB     Wrist APB 5.8 ?4.4 6.3 ?4.0 100 Wrist - APB 7   23.8     Upper arm APB 12.4  5.7  91.6 Upper arm - Wrist 23 35 ?49 22.7  L Ulnar - ADM     Wrist ADM 3.2 ?3.3 6.0 ?6.0 100 Wrist - ADM 7   23.2     B.Elbow ADM 9.3  5.4  90.4 B.Elbow - Wrist 26 43 ?49 22.8     A.Elbow ADM 11.6  4.8  88.5 A.Elbow - B.Elbow 10 44 ?49 22.9         A.Elbow - Wrist      R Ulnar - ADM     Wrist ADM 2.7 ?3.3 5.9 ?6.0 100 Wrist - ADM 7   21.8     B.Elbow ADM NR  NR  NR B.Elbow - Wrist 22 NR ?49 NR     A.Elbow ADM 10.9  5.2   A.Elbow - B.Elbow 10 NR ?49 22.1         A.Elbow - Wrist      L Peroneal - EDB     Ankle EDB NR ?6.5 NR ?2.0 NR Ankle - EDB 9   NR     Fib head EDB NR  NR  NR Fib head - Ankle   ?44 NR  L Tibial - AH     Ankle AH NR ?5.8 NR ?4.0 NR Ankle - AH 9   NR  [place]    Nerve / Sites Rec. Site Peak Lat Ref.  Amp Ref. Segments Distance    ms ms V V  cm  L Sural - Ankle (Calf)     Calf Ankle NR ?4.4 NR ?6 Calf - Ankle 14  L Superficial peroneal - Ankle     Lat leg Ankle NR ?4.4 NR ?6 Lat leg - Ankle 14  L Median - Orthodromic (Dig II, Mid palm)     Dig II Wrist NR ?3.4 NR ?10 Dig II - Wrist 13  R Median - Orthodromic (Dig II, Mid palm)     Dig II Wrist NR ?3.4 NR ?10 Dig II - Wrist 13  L Ulnar - Orthodromic, (Dig V, Mid palm)     Dig V Wrist NR ?3.1 NR ?5 Dig V - Wrist 11  R Ulnar - Orthodromic, (Dig V, Mid palm)     Dig V Wrist NR ?3.1 NR ?5 Dig V - Wrist 93                  F  Wave    Nerve F Lat Ref.   ms ms  L Ulnar - ADM 36.9 ?32.0  R Ulnar - ADM 36.5 ?32.0         EMG full       EMG Summary Table    Spontaneous MUAP Recruitment  Muscle IA Fib PSW Fasc Other Amp Dur. Poly Pattern  L. Abductor digiti minimi (manus) Normal None None None _______ Normal Normal Normal Normal  L. Pronator teres Normal None None None _______ Normal Normal Normal Normal  L. Biceps brachii Normal None None None _______ Normal Normal  Normal Normal  L. Deltoid Normal None None None _______ Normal Normal Normal Normal  L. Triceps brachii Normal None None None _______ Normal Normal Normal Normal  L. Flexor carpi ulnaris Normal None None None _______ Normal Normal Normal Normal  L. First dorsal interosseous Normal None None None _______ Normal Normal Normal Normal  L. Abductor pollicis brevis Normal None None None _______ Normal Normal Normal Reduced

## 2018-11-29 ENCOUNTER — Encounter: Payer: Self-pay | Admitting: Physical Therapy

## 2018-11-29 ENCOUNTER — Ambulatory Visit: Payer: Medicaid Other | Attending: Family Medicine | Admitting: Physical Therapy

## 2018-11-29 DIAGNOSIS — R293 Abnormal posture: Secondary | ICD-10-CM | POA: Insufficient documentation

## 2018-11-29 DIAGNOSIS — M6281 Muscle weakness (generalized): Secondary | ICD-10-CM | POA: Diagnosis present

## 2018-11-29 DIAGNOSIS — R2681 Unsteadiness on feet: Secondary | ICD-10-CM | POA: Diagnosis present

## 2018-11-29 DIAGNOSIS — R2689 Other abnormalities of gait and mobility: Secondary | ICD-10-CM | POA: Insufficient documentation

## 2018-11-29 DIAGNOSIS — M545 Low back pain, unspecified: Secondary | ICD-10-CM

## 2018-11-29 DIAGNOSIS — G8929 Other chronic pain: Secondary | ICD-10-CM | POA: Diagnosis present
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
## 2018-11-30 NOTE — Procedures (Signed)
   HISTORY: 58 year old male with history of passing out spells  TECHNIQUE:  This is a routine 16 channel EEG recording with one channel devoted to a limited EKG recording.  It was performed during wakefulness, drowsiness and asleep.  Hyperventilation and photic stimulation were performed as activating procedures.  There are minimum muscle and movement artifact noted.  Upon maximum arousal, posterior dominant waking rhythm consistent of mildly dysrhythmic activity with mixed alpha and theta range activity, activities are symmetric over the bilateral posterior derivations and attenuated with eye opening.  Hyperventilation produced mild/moderate buildup with higher amplitude and the slower activities noted.  Photic stimulation did not alter the tracing.  During EEG recording, patient developed drowsiness and no deeper stage of sleep was achieved  During EEG recording, there was no epileptiform discharge noted.  EKG demonstrate sinus rhythm, with heart rate of 84 bpm  CONCLUSION: This is a mild abnormal EEG.  There is no electrodiagnostic evidence of epileptiform discharge, the mild dysrhythmic slow background activity is usually due to metabolic toxic etiology.  Marcial Pacas, M.D. Ph.D.  [facility] [place], [place] [address] Phone: [phone] Fax:      [phone]

## 2018-12-04 ENCOUNTER — Ambulatory Visit: Payer: Medicaid Other | Admitting: Physical Therapy

## 2018-12-06 ENCOUNTER — Ambulatory Visit: Payer: Medicaid Other | Admitting: Physical Therapy

## 2018-12-06 ENCOUNTER — Encounter: Payer: Self-pay | Admitting: Physical Therapy

## 2018-12-06 DIAGNOSIS — G8929 Other chronic pain: Secondary | ICD-10-CM

## 2018-12-06 DIAGNOSIS — M6281 Muscle weakness (generalized): Secondary | ICD-10-CM

## 2018-12-06 DIAGNOSIS — M545 Low back pain, unspecified: Secondary | ICD-10-CM

## 2018-12-06 DIAGNOSIS — R2689 Other abnormalities of gait and mobility: Secondary | ICD-10-CM

## 2018-12-06 DIAGNOSIS — R293 Abnormal posture: Secondary | ICD-10-CM

## 2018-12-06 DIAGNOSIS — R2681 Unsteadiness on feet: Secondary | ICD-10-CM

## 2018-12-06 NOTE — Therapy (Signed)
[place], [place], [address] Phone: [phone]   Fax:  [phone]  Physical Therapy Treatment  Patient Details  Name: [name] MRN: [id number] Date of Birth: [birth year] Referring Provider (PT): Lowella Fairy Jerilee Hoh, FNP-C   Encounter Date: 12/06/2018  PT End of Session - 12/06/18 1052    Visit Number  9    Number of Visits  15    Date for PT Re-Evaluation  12/11/18    Authorization Type  Medicaid    Authorization Time Period  11/29/18-12/26/18    Authorization - Visit Number  2    Authorization - Number of Visits  8    PT Start Time  0935    PT Stop Time  6256    PT Time Calculation (min)  39 min    Activity Tolerance  Patient tolerated treatment well    Behavior During Therapy  [facility] for tasks assessed/performed       Past Medical History:  Diagnosis Date  . Anemia   . Arthritis   . Back pain   . CAD (coronary artery disease)    a. s/p DES to LAD in 05/2016  . Cervical radiculopathy   . Chronic diastolic CHF (congestive heart failure) ([place])   . Chronic pain   . Depression   . DVT (deep venous thrombosis) ([place])   . Hematemesis   . Hepatic steatosis   . Hyperlipidemia   . Hypertension   . IBS (irritable bowel syndrome)   . Morbid obesity ([place])   . OSA (obstructive sleep apnea)   . Pancreatitis   . PE (pulmonary thromboembolism) ([place])   . Peripheral neuropathy   . PUD (peptic ulcer disease)   . Renal disorder   . Stroke [facility])    a. ?details unclear - not seen on imaging when he was admitted in 05/2017 for TIA symptoms which were felt due to cervical radiculopathy.  . Thoracic aortic ectasia (HCC)    a. 4.3cm ectatic ascending thoracic aorta by CT 06/2017.   . Type 2 diabetes mellitus ([place])     Past Surgical History:  Procedure Laterality Date  . CARDIAC CATHETERIZATION N/A 05/31/2016   Procedure: Left Heart Cath and Coronary Angiography;  Surgeon: Peter M Martinique, MD;  Location: [place] CV LAB;  Service: Cardiovascular;  Laterality: N/A;  . CARDIAC CATHETERIZATION N/A 05/31/2016   Procedure: Intravascular Pressure Wire/FFR Study;  Surgeon: Peter M Martinique, MD;  Location: [place] CV LAB;  Service: Cardiovascular;  Laterality: N/A;  . CARDIAC CATHETERIZATION N/A 05/31/2016   Procedure: Coronary Stent Intervention;  Surgeon: Peter M Martinique, MD;  Location: [place] [facility] LAB;  Service: Cardiovascular;  Laterality: N/A;  . LEFT HEART CATH AND CORONARY ANGIOGRAPHY N/A 12/08/2017   Procedure: LEFT HEART CATH AND CORONARY ANGIOGRAPHY;  Surgeon: Leonie Man, MD;  Location: [place] CV LAB;  Service: Cardiovascular;  Laterality: N/A;  . LEFT HEART CATHETERIZATION WITH CORONARY ANGIOGRAM N/A 02/03/2014   Procedure: LEFT HEART CATHETERIZATION WITH CORONARY ANGIOGRAM;  Surgeon: Pixie Casino, MD;  Location: [facility] CATH LAB;  Service: Cardiovascular;  Laterality: N/A;  . left leg stent       There were no vitals filed for this visit.  Subjective Assessment - 12/06/18 0937    Subjective  Pt reports same pain as usual in hips, back, and knees.     Pain Score  7     Pain Location  Hip   And back   Pain  Orientation  Right    Pain Descriptors / Indicators  Stabbing;Aching    Pain Type  Chronic pain    Aggravating Factors   eather    Pain Relieving Factors  meds, band exercises                       [facility] Adult PT Treatment/Exercise - 12/06/18 0001      Self-Care   Self-Care  Other Self-Care Comments    Other Self-Care Comments   Pt requested information about how to strengthen core and lose weight, so PTA instructed pt to be mindful of tightening abdominal when performing other exercises for now and just be aware of what he's eating. Educated on importance of stretching to reduce back pain.       Lumbar Exercises: Stretches   Active Hamstring Stretch  2 reps;30 seconds    Passive Hamstring Stretch  Right   With VMO    Piriformis Stretch  1 rep;10  seconds   Right side, but pt reports pain in groin     Lumbar Exercises: Machines for Strengthening   Leg Press  2x10; half on #100; half on #80      Lumbar Exercises: Standing   Heel Raises  20 reps   Break between     Lumbar Exercises: Seated   Other Seated Lumbar Exercises  ball squeeze with abd. bracing 3-5 sec holds 10 reps   on sit fit     Manual Therapy   Manual Therapy  Soft tissue mobilization;Joint mobilization;Manual Traction    Joint Mobilization  A/P glides to rt hip to stretch posterior capsule and increase hip flexion    Soft tissue mobilization  Along rt quads and ITB to reduce hip pain and increase ROM    Done while pt in supine hip flexor stretch   Manual Traction  Long axis Distraction of rt hip to reduce pain and increase ROM; x3 for 30 sec holds               PT Short Term Goals - 12/06/18 1056      PT SHORT TERM GOAL #1   Title  Pt will be independent with HEP to address back pain, balance, and gait.  TARGET 12/15/17    Baseline  Pt reports not performing stretches and supine exercises.     Time  3    Period  Weeks    Status  On-going    Target Date  12/15/17      PT SHORT TERM GOAL #2   Title  Pt will improve improve TUG score to less than or equal to 15 seconds for decreased fall risk.    Baseline  TUG 19.03 seconds (Scores >13.5 seconds indicate increased fall risk.) during eval and 12.05 sec. on 12/14/17    Time  3    Period  Weeks    Status  Achieved      PT SHORT TERM GOAL #3   Title  Pt will verbalize understanding of posture/positioning with ADLs for improved functional mobility and decreased pain.    Baseline  rates pain as 6-8/10 in shoulders, hip, back at eval; sits in forward head, rounded shoulders, posterior pelvic tilt posture, pt rated pain 4-5/10 in back/LEs and was able to verbalize ways to decr. pain and improve posture on 12/14/17    Time  3    Period  Weeks    Status  Achieved    Target Date  12/15/17  PT SHORT TERM  GOAL #4   Title  Pt will improve gait velocity score to at least 2.62 ft/sec for improved efficiency and safety with gait.    Baseline  Gait velocity 2.53 ft/sec at eval; scores 1.31-2.62 ft/sec indicate limited community ambulator, 3.67f/sec. and 3.439fsec. no AD on 12/14/17    Time  3    Period  Weeks    Status  Achieved    Target Date  12/15/17        PT Long Term Goals - 12/06/18 1057      PT LONG TERM GOAL #1   Title  Independent with HEP    Baseline  met, will further update prn    Time  4    Period  Weeks    Status  Achieved      PT LONG TERM GOAL #2   Title  Tolerate sitting 30 min or greater for eating meals, car travel    Baseline  met    Time  4    Period  Weeks    Status  On-going      PT LONG TERM GOAL #3   Title  Tolerate walking 35-40 min for grocery shopping, IADLs    Baseline  improved but not consistently met    Time  4    Period  Weeks    Status  On-going      PT LONG TERM GOAL #4   Title  Right hip strength 5/5 to improve ability for lifting for chores at home    Baseline  4+/5 flex and ext    Time  4    Period  Weeks    Status  On-going      PT LONG TERM GOAL #5   Title  Increase trunk flexion AROM 5-10 deg to improve ability for bending for lifting for chores, donning shoes    Baseline  75 deg    Time  4    Period  Weeks    Status  On-going            Plan - 12/06/18 1059    Clinical Impression Statement  Mr.[place] still having stabbing pain at the same rate he was last week in his lower back, hips, and knees. He stated that his right hip is more stiff and painful, so PTA focused on stretching and reducing pain. Pt reports laying on his back increases his hip and back pain, so he tends to fall asleep in his recliner. Educated pt on why his hips might be tight from staying in flexed position. Provided joint mobs to rt hip to reduce pain and increase flexibility. Pt reports having less pain after tx.    PT Next Visit Plan   Continue core strength and manual heat as needed.  Check for innominate rotation and perform METs prn to address (right anterior), check goals    PT Home Exercise Plan  pelvic tilts, SKTC, hip. add. isometrics, clamshell, hip bridges, hamstring and piriformis stretches, clam and reverse clam, ab set in standing , prone hip ext with pillows.   Row , extension snd cross chest pulls RT and LT green band    Consulted and Agree with Plan of Care  Patient      During this treatment session, the therapist was present, participating in and directing the treatment.  Patient will benefit from skilled therapeutic intervention in order to improve the following deficits and impairments:  Pain, Impaired sensation, Decreased activity tolerance, Decreased endurance,  Decreased range of motion, Decreased strength, Obesity, Difficulty walking  Visit Diagnosis: Chronic bilateral low back pain without sciatica  Muscle weakness (generalized)  Abnormal posture  Unsteadiness on feet  Other abnormalities of gait and mobility  Chronic right-sided low back pain without sciatica     Problem List Patient Active Problem List   Diagnosis Date Noted  . Alteration consciousness 11/19/2018  . Neck pain 10/09/2018  . Left arm weakness 10/09/2018  . B12 deficiency 08/10/2017  . Persistent headaches 08/09/2017  . GERD (gastroesophageal reflux disease) 06/29/2017  . Left arm numbness   . Cerebral embolism with cerebral infarction 06/17/2017  . TIA (transient ischemic attack) 06/17/2017  . Chronic back pain 12/29/2016  . Depression 12/15/2016  . Vitamin D deficiency 12/09/2016  . Right hip pain 12/07/2016  . Hypokalemia 12/07/2016  . Type 2 diabetes mellitus with vascular disease ([place]) 05/31/2016  . Normocytic normochromic anemia 05/31/2016  . Chest pain 05/31/2016  . CAD (coronary artery disease) 01/06/2016  . DVT (deep venous thrombosis) ([place]) 01/06/2016  . Lactic acidosis 05/28/2014  . Nonspecific chest  pain 01/29/2014  . Uncontrolled secondary diabetes with peripheral neuropathy ([place]) 01/29/2014  . Obesity, Class III, BMI 40-49.9 (morbid obesity) ([place]) 01/29/2014  . Snoring 01/29/2014  . Dyslipidemia 01/29/2014  . HTN (hypertension) 01/29/2014  . Abnormal nuclear stress test 01/29/2014    Fuller Mandril, SPTA 12/06/2018, 11:47 AM   [name], PTA 12/06/18 12:08 PM Phone: [phone] Fax: [facility] [place], [place], [address] Phone: [phone]   Fax:  [phone]  Name: [name] MRN: [id number] Date of Birth: [birth year]

## 2018-12-11 ENCOUNTER — Ambulatory Visit: Payer: Medicaid Other

## 2018-12-11 DIAGNOSIS — M545 Low back pain, unspecified: Secondary | ICD-10-CM

## 2018-12-11 DIAGNOSIS — G8929 Other chronic pain: Secondary | ICD-10-CM

## 2018-12-11 DIAGNOSIS — R293 Abnormal posture: Secondary | ICD-10-CM

## 2018-12-11 DIAGNOSIS — M6281 Muscle weakness (generalized): Secondary | ICD-10-CM

## 2018-12-11 NOTE — Therapy (Signed)
[facility] [place], [place], [address] Phone: [phone]   Fax:  [phone]  Physical Therapy Treatment  Patient Details  Name: [name] MRN: [id number] Date of Birth: [birth year] Referring Provider (PT): Lowella Fairy Jerilee Hoh, FNP-C   Encounter Date: 12/11/2018  PT End of Session - 12/11/18 0929    Visit Number  10    Number of Visits  15    Date for PT Re-Evaluation  12/11/18    Authorization Type  Medicaid    Authorization Time Period  11/29/18-12/26/18    Authorization - Visit Number  3    Authorization - Number of Visits  8    PT Start Time  0930    PT Stop Time  3007    PT Time Calculation (min)  44 min    Activity Tolerance  Patient tolerated treatment well    Behavior During Therapy  [facility] for tasks assessed/performed       Past Medical History:  Diagnosis Date  . Anemia   . Arthritis   . Back pain   . CAD (coronary artery disease)    a. s/p DES to LAD in 05/2016  . Cervical radiculopathy   . Chronic diastolic CHF (congestive heart failure) ([place])   . Chronic pain   . Depression   . DVT (deep venous thrombosis) ([place])   . Hematemesis   . Hepatic steatosis   . Hyperlipidemia   . Hypertension   . IBS (irritable bowel syndrome)   . Morbid obesity ([place])   . OSA (obstructive sleep apnea)   . Pancreatitis   . PE (pulmonary thromboembolism) ([place])   . Peripheral neuropathy   . PUD (peptic ulcer disease)   . Renal disorder   . Stroke [facility])    a. ?details unclear - not seen on imaging when he was admitted in 05/2017 for TIA symptoms which were felt due to cervical radiculopathy.  . Thoracic aortic ectasia (HCC)    a. 4.3cm ectatic ascending thoracic aorta by CT 06/2017.   . Type 2 diabetes mellitus ([place])     Past Surgical History:  Procedure Laterality Date  . CARDIAC CATHETERIZATION N/A 05/31/2016   Procedure: Left Heart Cath and Coronary Angiography;  Surgeon: Peter M Martinique, MD;  Location: [place] CV LAB;  Service: Cardiovascular;  Laterality: N/A;  . CARDIAC CATHETERIZATION N/A 05/31/2016   Procedure: Intravascular Pressure Wire/FFR Study;  Surgeon: Peter M Martinique, MD;  Location: [place] CV LAB;  Service: Cardiovascular;  Laterality: N/A;  . CARDIAC CATHETERIZATION N/A 05/31/2016   Procedure: Coronary Stent Intervention;  Surgeon: Peter M Martinique, MD;  Location: [place] CV LAB;  Service: Cardiovascular;  Laterality: N/A;  . LEFT HEART CATH AND CORONARY ANGIOGRAPHY N/A 12/08/2017   Procedure: LEFT HEART CATH AND CORONARY ANGIOGRAPHY;  Surgeon: Leonie Man, MD;  Location: [place] CV LAB;  Service: Cardiovascular;  Laterality: N/A;  . LEFT HEART CATHETERIZATION WITH CORONARY ANGIOGRAM N/A 02/03/2014   Procedure: LEFT HEART CATHETERIZATION WITH CORONARY ANGIOGRAM;  Surgeon: Pixie Casino, MD;  Location: [facility] CATH LAB;  Service: Cardiovascular;  Laterality: N/A;  . left leg stent       There were no vitals filed for this visit.  Subjective Assessment - 12/11/18 0934    Subjective  5/10 pain today . Low BP so have not done much.     Pain Score  5     Pain Location  Back   and hip   Pain  Orientation  Right    Pain Descriptors / Indicators  Aching;Stabbing    Pain Type  Chronic pain    Pain Onset  More than a month ago    Pain Frequency  Constant    Aggravating Factors   activity    Pain Relieving Factors  meds , exercise    Pain Score  5    Pain Location  Neck    Pain Orientation  Left    Pain Descriptors / Indicators  Radiating    Pain Type  Chronic pain    Pain Frequency  Constant         [facility] PT Assessment - 12/11/18 0001      Assessment   Medical Diagnosis  Back pain, lumbosacral pain    Referring Provider (PT)  Lowella Fairy Jerilee Hoh, FNP-C      AROM   Right Hip Flexion  110   opposite knee flexed   Right Hip External Rotation   30    Right Hip Internal Rotation   0    Right Hip ABduction  23    Lumbar Flexion  75      Strength   Right Hip  Flexion  4+/5    Right Hip Extension  4+/5    Right Hip External Rotation   5/5    Right Hip Internal Rotation  5/5    Right Hip ABduction  5/5    Left Hip Flexion  5/5    Left Hip Extension  5/5    Left Hip External Rotation  5/5    Left Hip Internal Rotation  5/5    Left Hip ABduction  5/5    Right Knee Flexion  5/5    Right Knee Extension  5/5    Left Knee Flexion  5/5    Left Knee Extension  5/5    Right Ankle Dorsiflexion  5/5    Left Ankle Dorsiflexion  5/5                   [facility] Adult PT Treatment/Exercise - 12/11/18 0001      Lumbar Exercises: Stretches   Active Hamstring Stretch  2 reps;30 seconds      Lumbar Exercises: Standing   Heel Raises  20 reps      Lumbar Exercises: Seated   Other Seated Lumbar Exercises   on sit fit LAQ 3 # x 15 RT/Lt  ball squeeze with abd. bracing 3-5 sec holds 10 reps      Lumbar Exercises: Supine   Ab Set  10 reps    AB Set Limitations  10 sec    Pelvic Tilt  15 reps;5 seconds    Bridge  15 reps      Lumbar Exercises: Sidelying   Clam  Right;Left    Clam Limitations  12 reps and also reverse clams.     Hip Abduction  Right;Left;10 reps       Blue band shoulder ext and rows x 15 and cross chest pulls RT and LT x12         PT Short Term Goals - 12/11/18 0940      PT SHORT TERM GOAL #1   Title  Pt will be independent with HEP to address back pain, balance, and gait.  TARGET 12/15/17    Baseline  Band exer every day  other exercises 1-2days per week.     Status  Partially Met      PT SHORT TERM GOAL #2  Title  Pt will improve improve TUG score to less than or equal to 15 seconds for decreased fall risk.    Baseline  TUG 19.03 seconds (Scores >13.5 seconds indicate increased fall risk.) during eval and 12.05 sec. on 12/14/17    Status  Achieved      PT SHORT TERM GOAL #3   Title  Pt will verbalize understanding of posture/positioning with ADLs for improved functional mobility and decreased pain.    Status   Achieved      PT SHORT TERM GOAL #4   Title  Pt will improve gait velocity score to at least 2.62 ft/sec for improved efficiency and safety with gait.    Status  Achieved        PT Long Term Goals - 12/11/18 0941      PT LONG TERM GOAL #1   Title  Independent with HEP    Status  On-going      PT LONG TERM GOAL #2   Title  Tolerate sitting 30 min or greater for eating meals, car travel    Baseline  45 min generally    Status  Achieved      PT LONG TERM GOAL #3   Title  Tolerate walking 35-40 min for grocery shopping, IADLs    Baseline  close to an hour    Status  Achieved      PT LONG TERM GOAL #4   Title  Right hip strength 5/5 to improve ability for lifting for chores at home    Baseline  4+/5 flex and ext    Status  On-going      PT LONG TERM GOAL #5   Title  Increase trunk flexion AROM 5-10 deg to improve ability for bending for lifting for chores, donning shoes    Baseline  75 deg    Status  On-going            Plan - 12/11/18 0930    Clinical Impression Statement  less pain today due to less activity.   His pain levels fluctuate.  His function reportedly is better. RT hip ROM decreased from last measured (different PT).   He is not doing his HEP consistently.   His  ilia rotation RT probably due to RT hip joint tightness.  Will extend per plan and then probable discharge at that time    PT Treatment/Interventions  ADLs/Self Care Home Management;Cryotherapy;Electrical Stimulation;Ultrasound;Moist Heat;Traction;Neuromuscular re-education;Functional mobility training;Therapeutic activities;Therapeutic exercise;Patient/family education;Manual techniques;Dry needling;Taping    PT Next Visit Plan  Continue core strength and manual heat as needed.  Check for innominate rotation and perform METs prn to address (right anterior), check goals    PT Home Exercise Plan  pelvic tilts, SKTC, hip. add. isometrics, clamshell, hip bridges, hamstring and piriformis stretches, clam and  reverse clam, ab set in standing , prone hip ext with pillows.   Row , extension snd cross chest pulls RT and LT green band    Consulted and Agree with Plan of Care  Patient       Patient will benefit from skilled therapeutic intervention in order to improve the following deficits and impairments:  Pain, Impaired sensation, Decreased activity tolerance, Decreased endurance, Decreased range of motion, Decreased strength, Obesity, Difficulty walking  Visit Diagnosis: Chronic bilateral low back pain without sciatica  Muscle weakness (generalized)  Abnormal posture     Problem List Patient Active Problem List   Diagnosis Date Noted  . Alteration consciousness 11/19/2018  . Neck pain  10/09/2018  . Left arm weakness 10/09/2018  . B12 deficiency 08/10/2017  . Persistent headaches 08/09/2017  . GERD (gastroesophageal reflux disease) 06/29/2017  . Left arm numbness   . Cerebral embolism with cerebral infarction 06/17/2017  . TIA (transient ischemic attack) 06/17/2017  . Chronic back pain 12/29/2016  . Depression 12/15/2016  . Vitamin D deficiency 12/09/2016  . Right hip pain 12/07/2016  . Hypokalemia 12/07/2016  . Type 2 diabetes mellitus with vascular disease ([place]) 05/31/2016  . Normocytic normochromic anemia 05/31/2016  . Chest pain 05/31/2016  . CAD (coronary artery disease) 01/06/2016  . DVT (deep venous thrombosis) ([place]) 01/06/2016  . Lactic acidosis 05/28/2014  . Nonspecific chest pain 01/29/2014  . Uncontrolled secondary diabetes with peripheral neuropathy ([place]) 01/29/2014  . Obesity, Class III, BMI 40-49.9 (morbid obesity) ([place]) 01/29/2014  . Snoring 01/29/2014  . Dyslipidemia 01/29/2014  . HTN (hypertension) 01/29/2014  . Abnormal nuclear stress test 01/29/2014    Darrel Hoover  PT 12/11/2018, 10:04 AM  [facility] [address] [place], [place], [address] Phone: [phone]   Fax:  [phone]  Name:  MELECIO CUETO MRN: [id number] Date of Birth: 06-11-1961

## 2018-12-13 ENCOUNTER — Encounter: Payer: Self-pay | Admitting: Physical Therapy

## 2018-12-13 ENCOUNTER — Ambulatory Visit (INDEPENDENT_AMBULATORY_CARE_PROVIDER_SITE_OTHER): Payer: Medicaid Other

## 2018-12-13 ENCOUNTER — Encounter (HOSPITAL_COMMUNITY): Payer: Self-pay | Admitting: Emergency Medicine

## 2018-12-13 ENCOUNTER — Other Ambulatory Visit: Payer: Self-pay

## 2018-12-13 ENCOUNTER — Ambulatory Visit: Payer: Medicaid Other | Admitting: Physical Therapy

## 2018-12-13 ENCOUNTER — Ambulatory Visit (HOSPITAL_COMMUNITY)
Admission: EM | Admit: 2018-12-13 | Discharge: 2018-12-13 | Disposition: A | Discharge status: Home / Self Care | Payer: Medicaid Other | Attending: Family Medicine | Admitting: Family Medicine

## 2018-12-13 DIAGNOSIS — M25522 Pain in left elbow: Secondary | ICD-10-CM

## 2018-12-13 DIAGNOSIS — M545 Low back pain, unspecified: Secondary | ICD-10-CM

## 2018-12-13 DIAGNOSIS — R293 Abnormal posture: Secondary | ICD-10-CM

## 2018-12-13 DIAGNOSIS — G8929 Other chronic pain: Secondary | ICD-10-CM

## 2018-12-13 DIAGNOSIS — M6281 Muscle weakness (generalized): Secondary | ICD-10-CM

## 2018-12-13 MED ORDER — PREDNISONE 10 MG (21) PO TBPK: ORAL_TABLET | Freq: Every day | ORAL | 0 refills | Status: DC

## 2018-12-13 NOTE — Therapy (Signed)
[facility] [place], [place], [address] Phone: [phone]   Fax:  [phone]  Physical Therapy Treatment  Patient Details  Name: [name] MRN: [id number] Date of Birth: [birth year] Referring Provider (PT): Lowella Fairy Jerilee Hoh, FNP-C   Encounter Date: 12/13/2018  PT End of Session - 12/13/18 1018    Visit Number  11    Number of Visits  15    Date for PT Re-Evaluation  12/26/18    Authorization Type  Medicaid    Authorization Time Period  11/29/18-12/26/18    Authorization - Visit Number  4    Authorization - Number of Visits  8    PT Start Time  0929    PT Stop Time  1008    PT Time Calculation (min)  39 min    Activity Tolerance  Patient tolerated treatment well    Behavior During Therapy  [facility] for tasks assessed/performed       Past Medical History:  Diagnosis Date  . Anemia   . Arthritis   . Back pain   . CAD (coronary artery disease)    a. s/p DES to LAD in 05/2016  . Cervical radiculopathy   . Chronic diastolic CHF (congestive heart failure) ([place])   . Chronic pain   . Depression   . DVT (deep venous thrombosis) ([place])   . Hematemesis   . Hepatic steatosis   . Hyperlipidemia   . Hypertension   . IBS (irritable bowel syndrome)   . Morbid obesity ([place])   . OSA (obstructive sleep apnea)   . Pancreatitis   . PE (pulmonary thromboembolism) ([place])   . Peripheral neuropathy   . PUD (peptic ulcer disease)   . Renal disorder   . Stroke [facility])    a. ?details unclear - not seen on imaging when he was admitted in 05/2017 for TIA symptoms which were felt due to cervical radiculopathy.  . Thoracic aortic ectasia (HCC)    a. 4.3cm ectatic ascending thoracic aorta by CT 06/2017.   . Type 2 diabetes mellitus ([place])     Past Surgical History:  Procedure Laterality Date  . CARDIAC CATHETERIZATION N/A 05/31/2016   Procedure: Left Heart Cath and Coronary Angiography;  Surgeon: Peter M Martinique, MD;  Location: [place] CV LAB;  Service: Cardiovascular;  Laterality: N/A;  . CARDIAC CATHETERIZATION N/A 05/31/2016   Procedure: Intravascular Pressure Wire/FFR Study;  Surgeon: Peter M Martinique, MD;  Location: [place] CV LAB;  Service: Cardiovascular;  Laterality: N/A;  . CARDIAC CATHETERIZATION N/A 05/31/2016   Procedure: Coronary Stent Intervention;  Surgeon: Peter M Martinique, MD;  Location: [place] CV LAB;  Service: Cardiovascular;  Laterality: N/A;  . LEFT HEART CATH AND CORONARY ANGIOGRAPHY N/A 12/08/2017   Procedure: LEFT HEART CATH AND CORONARY ANGIOGRAPHY;  Surgeon: Leonie Man, MD;  Location: [place] CV LAB;  Service: Cardiovascular;  Laterality: N/A;  . LEFT HEART CATHETERIZATION WITH CORONARY ANGIOGRAM N/A 02/03/2014   Procedure: LEFT HEART CATHETERIZATION WITH CORONARY ANGIOGRAM;  Surgeon: Pixie Casino, MD;  Location: [facility] CATH LAB;  Service: Cardiovascular;  Laterality: N/A;  . left leg stent       There were no vitals filed for this visit.  Subjective Assessment - 12/13/18 0932    Subjective  No new complaints/concerns regarding low back or hip. Pt. continues with complaints of left arm symptoms-he reports this was exacerbated with driving and that he plans to go to the ED later this AM  to have arm checked. Pt. has had arm assessed recently with testing including cervical MRI, nerve conduction studies with findings of peripheral neuropathy and carpal tunnel syndrome. Did not advise pt. against ED visit but based on previous assessment of these symptoms by MD and diagnostics to this point advised to contact MD office (has seen neurologist re: symptoms) for further advice/options.    Currently in Pain?  Yes    Pain Score  9     Pain Location  Arm    Pain Orientation  Left    Pain Descriptors / Indicators  Sharp;Numbness    Pain Type  --   acute on chronic   Pain Radiating Towards  left elbow to hand    Pain Onset  --   exacerbated for the past 3 days   Pain Frequency  Constant     Aggravating Factors   activity, use of arm    Pain Relieving Factors  use of brace, topical analgesic    Effect of Pain on Daily Activities  Limits ability for left UE use for ADLs with reaching and lifting activities                       [facility] Adult PT Treatment/Exercise - 12/13/18 0001      Lumbar Exercises: Stretches   Passive Hamstring Stretch  Right;Left;3 reps;30 seconds    Double Knee to Chest Stretch  --   AAROM with 55 cm P-ball x 15 reps     Lumbar Exercises: Seated   Other Seated Lumbar Exercises  LAQ with ball squeeze 3 lbs. x 15 ea. bilat.    Other Seated Lumbar Exercises  clamshell x 15 with green band      Lumbar Exercises: Supine   Pelvic Tilt  15 reps;5 seconds    Clam  15 reps    Clam Limitations  Green band    Bent Knee Raise  15 reps    Bridge with [employer]  15 reps      Manual Therapy   Joint Mobilization  AP glides to right hip with belt use and long axis distraction to bilat. hips grade I-IV             PT Education - 12/13/18 1016    Education Details  Contact MD office re: arm symptoms for advice as noted in subjective    Person(s) Educated  Patient    Methods  Explanation    Comprehension  Verbalized understanding       PT Short Term Goals - 12/11/18 0940      PT SHORT TERM GOAL #1   Title  Pt will be independent with HEP to address back pain, balance, and gait.  TARGET 12/15/17    Baseline  Band exer every day  other exercises 1-2days per week.     Status  Partially Met      PT SHORT TERM GOAL #2   Title  Pt will improve improve TUG score to less than or equal to 15 seconds for decreased fall risk.    Baseline  TUG 19.03 seconds (Scores >13.5 seconds indicate increased fall risk.) during eval and 12.05 sec. on 12/14/17    Status  Achieved      PT SHORT TERM GOAL #3   Title  Pt will verbalize understanding of posture/positioning with ADLs for improved functional mobility and decreased pain.    Status  Achieved       PT SHORT TERM GOAL #  4   Title  Pt will improve gait velocity score to at least 2.62 ft/sec for improved efficiency and safety with gait.    Status  Achieved        PT Long Term Goals - 12/11/18 0941      PT LONG TERM GOAL #1   Title  Independent with HEP    Status  On-going      PT LONG TERM GOAL #2   Title  Tolerate sitting 30 min or greater for eating meals, car travel    Baseline  45 min generally    Status  Achieved      PT LONG TERM GOAL #3   Title  Tolerate walking 35-40 min for grocery shopping, IADLs    Baseline  close to an hour    Status  Achieved      PT LONG TERM GOAL #4   Title  Right hip strength 5/5 to improve ability for lifting for chores at home    Baseline  4+/5 flex and ext    Status  On-going      PT LONG TERM GOAL #5   Title  Increase trunk flexion AROM 5-10 deg to improve ability for bending for lifting for chores, donning shoes    Baseline  75 deg    Status  On-going            Plan - 12/13/18 1018    Clinical Impression Statement  Held exercises involving UE use due to c/o severe arm pain. Pt. rated (arm pain) 9/10 with no overt signs of physiological distress-pt. advised to contact/follow up with MD as needed re: arm symptoms. Pt. continuing to improve in terms of back and hip pain symptoms from baseline status.    Clinical Impairments Affecting Rehab Potential  chronic pain history, past therapy, comorbidities    PT Frequency  2x / week    PT Duration  4 weeks    PT Treatment/Interventions  ADLs/Self Care Home Management;Cryotherapy;Electrical Stimulation;Ultrasound;Moist Heat;Traction;Neuromuscular re-education;Functional mobility training;Therapeutic activities;Therapeutic exercise;Patient/family education;Manual techniques;Dry needling;Taping    PT Next Visit Plan  Monitor arm pain symptoms, Continue core strength and manual therapy as needed.  Check for innominate rotation and perform METs prn to address (right anterior), check goals    PT  Home Exercise Plan  pelvic tilts, SKTC, hip. add. isometrics, clamshell, hip bridges, hamstring and piriformis stretches, clam and reverse clam, ab set in standing , prone hip ext with pillows.   Row , extension snd cross chest pulls RT and LT green band    Consulted and Agree with Plan of Care  Patient       Patient will benefit from skilled therapeutic intervention in order to improve the following deficits and impairments:  Pain, Impaired sensation, Decreased activity tolerance, Decreased endurance, Decreased range of motion, Decreased strength, Obesity, Difficulty walking  Visit Diagnosis: Chronic bilateral low back pain without sciatica  Muscle weakness (generalized)  Abnormal posture     Problem List Patient Active Problem List   Diagnosis Date Noted  . Alteration consciousness 11/19/2018  . Neck pain 10/09/2018  . Left arm weakness 10/09/2018  . B12 deficiency 08/10/2017  . Persistent headaches 08/09/2017  . GERD (gastroesophageal reflux disease) 06/29/2017  . Left arm numbness   . Cerebral embolism with cerebral infarction 06/17/2017  . TIA (transient ischemic attack) 06/17/2017  . Chronic back pain 12/29/2016  . Depression 12/15/2016  . Vitamin D deficiency 12/09/2016  . Right hip pain 12/07/2016  . Hypokalemia 12/07/2016  .  Type 2 diabetes mellitus with vascular disease ([place]) 05/31/2016  . Normocytic normochromic anemia 05/31/2016  . Chest pain 05/31/2016  . CAD (coronary artery disease) 01/06/2016  . DVT (deep venous thrombosis) ([place]) 01/06/2016  . Lactic acidosis 05/28/2014  . Nonspecific chest pain 01/29/2014  . Uncontrolled secondary diabetes with peripheral neuropathy ([place]) 01/29/2014  . Obesity, Class III, BMI 40-49.9 (morbid obesity) ([place]) 01/29/2014  . Snoring 01/29/2014  . Dyslipidemia 01/29/2014  . HTN (hypertension) 01/29/2014  . Abnormal nuclear stress test 01/29/2014    Beaulah Dinning, PT, DPT 12/13/18 10:24 AM  [facility] [address] [place], [place], [address] Phone: [phone]   Fax:  [phone]  Name: OATHER MUILENBURG MRN: [id number] Date of Birth: Sep 15, 1961

## 2018-12-13 NOTE — ED Provider Notes (Signed)
Aneta   [id number] 12/13/18 Arrival Time: [place] PLAN:  1. Left elbow pain    I have personally viewed the imaging studies ordered this visit. Noticeable elevated anterior and posterior fat pads.  I do not suspect infectious etiology. Discussed inflammatory vs occult fx but he reports no trauma. Plans to call his orthopaedist to arrange prompt follow up.  Cannot take NSAIDS. Trial of: Meds ordered this encounter  Medications  . predniSONE (STERAPRED UNI-PAK 21 TAB) 10 MG (21) TBPK tablet    Sig: Take by mouth daily. Take as directed.    Dispense:  21 tablet    Refill:  0    Reviewed expectations re: course of current medical issues. Questions answered. Outlined signs and symptoms indicating need for more acute intervention. Patient verbalized understanding. After Visit Summary given.  SUBJECTIVE: History from: patient. [name] is a 58 y.o. male who reports persistent marked pain of his left elbow; described as aching without radiation. Onset: abrupt, today. Injury/trama: no, reports acute pain in L elbow while turning his steering wheel to the right; having difficulty fully extending his arm at elbow since. Symptoms have progressed to a point and plateaued since beginning. Aggravating factors: movement. Alleviating factors: holding still. Associated symptoms: none reported. Extremity sensation changes or weakness: none. Self treatment: has not tried OTCs for relief of pain. History of similar: no.  Past Surgical History:  Procedure Laterality Date  . CARDIAC CATHETERIZATION N/A 05/31/2016   Procedure: Left Heart Cath and Coronary Angiography;  Surgeon: Peter M Martinique, MD;  Location: [facility];  Service: Cardiovascular;  Laterality: N/A;  . CARDIAC CATHETERIZATION N/A 05/31/2016   Procedure: Intravascular Pressure Wire/FFR Study;  Surgeon: Peter M Martinique, MD;  Location: [facility];  Service: Cardiovascular;  Laterality:  N/A;  . CARDIAC CATHETERIZATION N/A 05/31/2016   Procedure: Coronary Stent Intervention;  Surgeon: Peter M Martinique, MD;  Location: [facility];  Service: Cardiovascular;  Laterality: N/A;  . LEFT HEART CATH AND CORONARY ANGIOGRAPHY N/A 12/08/2017   Procedure: LEFT HEART CATH AND CORONARY ANGIOGRAPHY;  Surgeon: Leonie Man, MD;  Location: [facility];  Service: Cardiovascular;  Laterality: N/A;  . LEFT HEART CATHETERIZATION WITH CORONARY ANGIOGRAM N/A 02/03/2014   Procedure: LEFT HEART CATHETERIZATION WITH CORONARY ANGIOGRAM;  Surgeon: Pixie Casino, MD;  Location: [facility] CATH LAB;  Service: Cardiovascular;  Laterality: N/A;  . left leg stent       ROS: As per HPI. All other systems negative.    OBJECTIVE:  Vitals:   12/13/18 1417  BP: 120/63  Pulse: 85  Resp: 16  Temp: 98.5 F (36.9 C)  TempSrc: Oral  SpO2: 98%    General appearance: alert; no distress Extremities: . LUE: warm and well perfused; poorly localized marked tenderness over left elbow; "just hurts all over"; without gross deformities; with mild swelling; with no bruising; ROM: limited by pain; difficulty fully extending L elbow CV: brisk extremity capillary refill of LUE; 2+ radial pulse of LUE. Skin: warm and dry; no visible rashes Neurologic: gait normal; normal reflexes of RUE and LUE; normal sensation of RUE and LUE; normal strength of RUE and LUE Psychological: alert and cooperative; normal mood and affect  Imaging: Dg Elbow Complete Left  Result Date: 12/13/2018 CLINICAL DATA:  58 y/o M; pain and swelling of the left elbow. No known injury. EXAM: LEFT ELBOW - COMPLETE 3+ VIEW COMPARISON:  04/18/2015 left elbow radiographs. FINDINGS: No acute  fracture or dislocation identified. Elevation of anterior and posterior fat pads indicates an underlying joint effusion which may be infectious, inflammatory, or related to occult fracture. Osteoarthrosis of the elbow joint with periarticular osteophytosis.  Enthesopathy of the epicondyles. IMPRESSION: 1. No acute fracture or dislocation identified. 2. Elevation of anterior and posterior fat pads indicates an underlying joint effusion which may be infectious, inflammatory, or related to occult fracture. Electronically Signed   By: Kristine Garbe M.D.   On: 12/13/2018 14:43     Allergies  Allergen Reactions  . Coconut Flavor [Flavoring Agent] Hives  . Coconut Oil Hives  . Ibuprofen Other (See Comments)    Made gastric ulcers worse  . Aleve [Naproxen] Other (See Comments)    DUE TO KIDNEYS  . Nsaids Other (See Comments)    Stomach ulcers     Past Medical History:  Diagnosis Date  . Anemia   . Arthritis   . Back pain   . CAD (coronary artery disease)    a. s/p DES to LAD in 05/2016  . Cervical radiculopathy   . Chronic diastolic CHF (congestive heart failure) ([place])   . Chronic pain   . Depression   . DVT (deep venous thrombosis) ([place])   . Hematemesis   . Hepatic steatosis   . Hyperlipidemia   . Hypertension   . IBS (irritable bowel syndrome)   . Morbid obesity ([place])   . OSA (obstructive sleep apnea)   . Pancreatitis   . PE (pulmonary thromboembolism) ([place])   . Peripheral neuropathy   . PUD (peptic ulcer disease)   . Renal disorder   . Stroke [facility])    a. ?details unclear - not seen on imaging when he was admitted in 05/2017 for TIA symptoms which were felt due to cervical radiculopathy.  . Thoracic aortic ectasia (HCC)    a. 4.3cm ectatic ascending thoracic aorta by CT 06/2017.   . Type 2 diabetes mellitus ([place])    Social History   Socioeconomic History  . Marital status: Single    Spouse name: Not on file  . Number of children: 3  . Years of education: 57  . Highest education level: Not on file  Occupational History  . Occupation: Disabled  Social Needs  . Financial resource strain: Not on file  . Food insecurity:    Worry: Not on file    Inability: Not on file  . Transportation needs:    Medical: Not  on file    Non-medical: Not on file  Tobacco Use  . Smoking status: Never Smoker  . Smokeless tobacco: Never Used  Substance and Sexual Activity  . Alcohol use: No  . Drug use: No  . Sexual activity: Not on file  Lifestyle  . Physical activity:    Days per week: Not on file    Minutes per session: Not on file  . Stress: Not on file  Relationships  . Social connections:    Talks on phone: Not on file    Gets together: Not on file    Attends religious service: Not on file    Active member of club or organization: Not on file    Attends meetings of clubs or organizations: Not on file    Relationship status: Not on file  Other Topics Concern  . Not on file  Social History Narrative   Independent and ambulatory with cane.   Lives at home alone.   Right-handed.   1-2 cups caffeine per day.  Family History  Problem Relation Age of Onset  . Cancer Father   . Hypertension Mother   . Diabetes Mother   . Breast cancer Mother   . Hypertension Brother   . Diabetes Brother   . Hypertension Sister   . Diabetes Sister    Past Surgical History:  Procedure Laterality Date  . CARDIAC CATHETERIZATION N/A 05/31/2016   Procedure: Left Heart Cath and Coronary Angiography;  Surgeon: Peter M Martinique, MD;  Location: [place];  Service: Cardiovascular;  Laterality: N/A;  . CARDIAC CATHETERIZATION N/A 05/31/2016   Procedure: Intravascular Pressure Wire/FFR Study;  Surgeon: Peter M Martinique, MD;  Location: [facility];  Service: Cardiovascular;  Laterality: N/A;  . CARDIAC CATHETERIZATION N/A 05/31/2016   Procedure: Coronary Stent Intervention;  Surgeon: Peter M Martinique, MD;  Location: [facility];  Service: Cardiovascular;  Laterality: N/A;  . LEFT HEART CATH AND CORONARY ANGIOGRAPHY N/A 12/08/2017   Procedure: LEFT HEART CATH AND CORONARY ANGIOGRAPHY;  Surgeon: Leonie Man, MD;  Location: [facility];  Service: Cardiovascular;  Laterality: N/A;  . LEFT HEART  CATHETERIZATION WITH CORONARY ANGIOGRAM N/A 02/03/2014   Procedure: LEFT HEART CATHETERIZATION WITH CORONARY ANGIOGRAM;  Surgeon: Pixie Casino, MD;  Location: [facility] CATH LAB;  Service: Cardiovascular;  Laterality: N/A;  . left leg stent         Vanessa Kick, MD 12/13/18 1515

## 2018-12-13 NOTE — Discharge Instructions (Addendum)
If you decide to start the prednisone, be aware that this will raise your blood sugars.
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
## 2018-12-17 ENCOUNTER — Ambulatory Visit: Payer: Medicaid Other | Admitting: Physical Therapy

## 2018-12-20 ENCOUNTER — Encounter: Payer: Medicaid Other | Admitting: Physical Therapy

## 2018-12-20 ENCOUNTER — Ambulatory Visit: Payer: Medicaid Other | Admitting: Physical Therapy

## 2018-12-20 ENCOUNTER — Encounter: Payer: Self-pay | Admitting: Physical Therapy

## 2018-12-20 DIAGNOSIS — M6281 Muscle weakness (generalized): Secondary | ICD-10-CM

## 2018-12-20 DIAGNOSIS — G8929 Other chronic pain: Secondary | ICD-10-CM

## 2018-12-20 DIAGNOSIS — M545 Low back pain, unspecified: Secondary | ICD-10-CM

## 2018-12-20 DIAGNOSIS — R293 Abnormal posture: Secondary | ICD-10-CM

## 2018-12-20 NOTE — Therapy (Signed)
[place], [place], [address] Phone: [phone]   Fax:  [phone]  Physical Therapy Treatment  Patient Details  Name: [name] MRN: [id number] Date of Birth: 30-Jun-1961 Referring Provider (PT): Lowella Fairy Jerilee Hoh, FNP-C   Encounter Date: 12/20/2018  PT End of Session - 12/20/18 0851    Visit Number  12    Number of Visits  15    Date for PT Re-Evaluation  12/26/18    Authorization Type  Medicaid    Authorization Time Period  11/29/18-12/26/18    Authorization - Visit Number  5    Authorization - Number of Visits  8    PT Start Time  0846    PT Stop Time  0925    PT Time Calculation (min)  39 min       Past Medical History:  Diagnosis Date  . Anemia   . Arthritis   . Back pain   . CAD (coronary artery disease)    a. s/p DES to LAD in 05/2016  . Cervical radiculopathy   . Chronic diastolic CHF (congestive heart failure) ([place])   . Chronic pain   . Depression   . DVT (deep venous thrombosis) ([place])   . Hematemesis   . Hepatic steatosis   . Hyperlipidemia   . Hypertension   . IBS (irritable bowel syndrome)   . Morbid obesity ([place])   . OSA (obstructive sleep apnea)   . Pancreatitis   . PE (pulmonary thromboembolism) ([place])   . Peripheral neuropathy   . PUD (peptic ulcer disease)   . Renal disorder   . Stroke [facility])    a. ?details unclear - not seen on imaging when he was admitted in 05/2017 for TIA symptoms which were felt due to cervical radiculopathy.  . Thoracic aortic ectasia (HCC)    a. 4.3cm ectatic ascending thoracic aorta by CT 06/2017.   . Type 2 diabetes mellitus ([place])     Past Surgical History:  Procedure Laterality Date  . CARDIAC CATHETERIZATION N/A 05/31/2016   Procedure: Left Heart Cath and Coronary Angiography;  Surgeon: Peter M Martinique, MD;  Location: [facility];  Service: Cardiovascular;  Laterality: N/A;  . CARDIAC CATHETERIZATION N/A 05/31/2016   Procedure:  Intravascular Pressure Wire/FFR Study;  Surgeon: Peter M Martinique, MD;  Location: [facility];  Service: Cardiovascular;  Laterality: N/A;  . CARDIAC CATHETERIZATION N/A 05/31/2016   Procedure: Coronary Stent Intervention;  Surgeon: Peter M Martinique, MD;  Location: [facility];  Service: Cardiovascular;  Laterality: N/A;  . LEFT HEART CATH AND CORONARY ANGIOGRAPHY N/A 12/08/2017   Procedure: LEFT HEART CATH AND CORONARY ANGIOGRAPHY;  Surgeon: Leonie Man, MD;  Location: [facility];  Service: Cardiovascular;  Laterality: N/A;  . LEFT HEART CATHETERIZATION WITH CORONARY ANGIOGRAM N/A 02/03/2014   Procedure: LEFT HEART CATHETERIZATION WITH CORONARY ANGIOGRAM;  Surgeon: Pixie Casino, MD;  Location: [facility] CATH LAB;  Service: Cardiovascular;  Laterality: N/A;  . left leg stent       There were no vitals filed for this visit.  Subjective Assessment - 12/20/18 0849    Subjective  MD sent me to hospital for arm pain. Found inflammation in my elbow and they put me on steroids which helped but still have arm pain at night. No back pain likely due to predisone as well. I am seeing another MD today at [facility] who is a pain doctor. No pain right now.  Currently in Pain?  No/denies         [facility] PT Assessment - 12/20/18 0001      AROM   Lumbar Flexion  90   able to touch toes today     Strength   Right Hip Flexion  4+/5    Right Hip Extension  4+/5                   [facility] Adult PT Treatment/Exercise - 12/20/18 0001      Lumbar Exercises: Stretches   Other Lumbar Stretch Exercise  seated childs pose stretch with large ball- right hip pain       Lumbar Exercises: Aerobic   Nustep  L6 LE only       Lumbar Exercises: Standing   Row  20 reps    Theraband Level (Row)  Level 4 (Blue)    Shoulder Extension  20 reps    Theraband Level (Shoulder Extension)  Level 4 (Blue)    Other Standing Lumbar Exercises  Pall of press double blue band x 20 ea. way,  green band D1 extension , D2 extension, D2 flexion all with green x 20, rotational stab single green band pull horizontal adduction to mid line -feet narrow x 15 each way     Other Standing Lumbar Exercises  squat to chair tap 10 x 2, added 10# kettle bell, few reps of dead lift however mechanics not the best so discontinued                PT Short Term Goals - 12/11/18 0940      PT SHORT TERM GOAL #1   Title  Pt will be independent with HEP to address back pain, balance, and gait.  TARGET 12/15/17    Baseline  Band exer every day  other exercises 1-2days per week.     Status  Partially Met      PT SHORT TERM GOAL #2   Title  Pt will improve improve TUG score to less than or equal to 15 seconds for decreased fall risk.    Baseline  TUG 19.03 seconds (Scores >13.5 seconds indicate increased fall risk.) during eval and 12.05 sec. on 12/14/17    Status  Achieved      PT SHORT TERM GOAL #3   Title  Pt will verbalize understanding of posture/positioning with ADLs for improved functional mobility and decreased pain.    Status  Achieved      PT SHORT TERM GOAL #4   Title  Pt will improve gait velocity score to at least 2.62 ft/sec for improved efficiency and safety with gait.    Status  Achieved        PT Long Term Goals - 12/20/18 0926      PT LONG TERM GOAL #1   Title  Independent with HEP    Baseline  prefers standing, sitting HEP    Time  4    Period  Weeks    Status  On-going      PT LONG TERM GOAL #2   Title  Tolerate sitting 30 min or greater for eating meals, car travel    Baseline  45 min generally    Time  4    Period  Weeks    Status  Achieved      PT LONG TERM GOAL #3   Title  Tolerate walking 35-40 min for grocery shopping, IADLs    Baseline  close to an hour  Time  4    Period  Weeks    Status  Achieved      PT LONG TERM GOAL #4   Title  Right hip strength 5/5 to improve ability for lifting for chores at home    Baseline  4+/5 flex and ext    Time   4    Period  Weeks    Status  Partially Met      PT LONG TERM GOAL #5   Title  Increase trunk flexion AROM 5-10 deg to improve ability for bending for lifting for chores, donning shoes    Baseline  90 degrees flexion    Time  4    Period  Weeks    Status  Partially Met            Plan - 12/20/18 0908    Clinical Impression Statement  Pt reported being treated with steroids for arm pain. Elbow still hurts at night. He has no LBP today likey due to steroids treatment. He requests to work with [facility]ands for standing core. He did not have any arm pain with this. Added kettle bell squats. Attempted dead lifts but poor form so discontinued. Lumbar AROM improved, able to touch toes, LTG#4 partially met. Hip flexion and extension still 4+/5, LTG#5 partially met. He has 2 more treatments prior to discarge. Overall, much improved. Sme limitiations from right hip , which he reports may need hip replacement.     PT Next Visit Plan  Monitor arm pain symptoms, Finalize HEP next 2 visits and discahrge.   Review squats and standing core (he does not like mat exercises. Prefers standing/seated.     PT Home Exercise Plan  pelvic tilts, SKTC, hip. add. isometrics, clamshell, hip bridges, hamstring and piriformis stretches, clam and reverse clam, ab set in standing , prone hip ext with pillows.   Row , extension snd cross chest pulls RT and LT green band    Consulted and Agree with Plan of Care  Patient       Patient will benefit from skilled therapeutic intervention in order to improve the following deficits and impairments:  Pain, Impaired sensation, Decreased activity tolerance, Decreased endurance, Decreased range of motion, Decreased strength, Obesity, Difficulty walking  Visit Diagnosis: Chronic bilateral low back pain without sciatica  Muscle weakness (generalized)  Abnormal posture     Problem List Patient Active Problem List   Diagnosis Date Noted  . Alteration consciousness 11/19/2018   . Neck pain 10/09/2018  . Left arm weakness 10/09/2018  . B12 deficiency 08/10/2017  . Persistent headaches 08/09/2017  . GERD (gastroesophageal reflux disease) 06/29/2017  . Left arm numbness   . Cerebral embolism with cerebral infarction 06/17/2017  . TIA (transient ischemic attack) 06/17/2017  . Chronic back pain 12/29/2016  . Depression 12/15/2016  . Vitamin D deficiency 12/09/2016  . Right hip pain 12/07/2016  . Hypokalemia 12/07/2016  . Type 2 diabetes mellitus with vascular disease ([place]) 05/31/2016  . Normocytic normochromic anemia 05/31/2016  . Chest pain 05/31/2016  . CAD (coronary artery disease) 01/06/2016  . DVT (deep venous thrombosis) ([place]) 01/06/2016  . Lactic acidosis 05/28/2014  . Nonspecific chest pain 01/29/2014  . Uncontrolled secondary diabetes with peripheral neuropathy ([place]) 01/29/2014  . Obesity, Class III, BMI 40-49.9 (morbid obesity) ([place]) 01/29/2014  . Snoring 01/29/2014  . Dyslipidemia 01/29/2014  . HTN (hypertension) 01/29/2014  . Abnormal nuclear stress test 01/29/2014    Dorene Ar , PTA 12/20/2018, 10:50 AM  [facility] [address] [place], [place], [address] Phone: [phone]   Fax:  [phone]  Name: CHUNG CHAGOYA MRN: [id number] Date of Birth: [birth year]

## 2018-12-22 ENCOUNTER — Other Ambulatory Visit: Payer: Self-pay | Admitting: Neurology

## 2018-12-24 ENCOUNTER — Encounter: Payer: Medicaid Other | Admitting: Physical Therapy

## 2018-12-25 ENCOUNTER — Encounter: Payer: Self-pay | Admitting: Physical Therapy

## 2018-12-25 ENCOUNTER — Ambulatory Visit: Payer: Medicaid Other | Attending: Family Medicine | Admitting: Physical Therapy

## 2018-12-25 DIAGNOSIS — M545 Low back pain, unspecified: Secondary | ICD-10-CM

## 2018-12-25 DIAGNOSIS — R293 Abnormal posture: Secondary | ICD-10-CM

## 2018-12-25 DIAGNOSIS — R2689 Other abnormalities of gait and mobility: Secondary | ICD-10-CM | POA: Diagnosis present

## 2018-12-25 DIAGNOSIS — G8929 Other chronic pain: Secondary | ICD-10-CM | POA: Diagnosis present

## 2018-12-25 DIAGNOSIS — R2681 Unsteadiness on feet: Secondary | ICD-10-CM | POA: Insufficient documentation

## 2018-12-25 DIAGNOSIS — M6281 Muscle weakness (generalized): Secondary | ICD-10-CM | POA: Diagnosis present

## 2018-12-25 NOTE — Therapy (Signed)
[place], [place], [address] Phone: [phone]   Fax:  [phone]  Physical Therapy Treatment  Patient Details  Name: [name] MRN: [id number] Date of Birth: 1961-09-16 Referring Provider (PT): Lowella Fairy Jerilee Hoh, FNP-C   Encounter Date: 12/25/2018  PT End of Session - 12/25/18 1324    Visit Number  13    Number of Visits  15    Date for PT Re-Evaluation  12/26/18    Authorization Type  Medicaid    Authorization Time Period  11/29/18-12/26/18    Authorization - Visit Number  6    Authorization - Number of Visits  8    PT Start Time  0762    PT Stop Time  0915    PT Time Calculation (min)  41 min    Activity Tolerance  Patient tolerated treatment well    Behavior During Therapy  [facility] for tasks assessed/performed       Past Medical History:  Diagnosis Date  . Anemia   . Arthritis   . Back pain   . CAD (coronary artery disease)    a. s/p DES to LAD in 05/2016  . Cervical radiculopathy   . Chronic diastolic CHF (congestive heart failure) ([place])   . Chronic pain   . Depression   . DVT (deep venous thrombosis) ([place])   . Hematemesis   . Hepatic steatosis   . Hyperlipidemia   . Hypertension   . IBS (irritable bowel syndrome)   . Morbid obesity ([place])   . OSA (obstructive sleep apnea)   . Pancreatitis   . PE (pulmonary thromboembolism) ([place])   . Peripheral neuropathy   . PUD (peptic ulcer disease)   . Renal disorder   . Stroke [facility])    a. ?details unclear - not seen on imaging when he was admitted in 05/2017 for TIA symptoms which were felt due to cervical radiculopathy.  . Thoracic aortic ectasia (HCC)    a. 4.3cm ectatic ascending thoracic aorta by CT 06/2017.   . Type 2 diabetes mellitus ([place])     Past Surgical History:  Procedure Laterality Date  . CARDIAC CATHETERIZATION N/A 05/31/2016   Procedure: Left Heart Cath and Coronary Angiography;  Surgeon: Peter M Martinique, MD;  Location: [place] [facility];  Service: Cardiovascular;  Laterality: N/A;  . CARDIAC CATHETERIZATION N/A 05/31/2016   Procedure: Intravascular Pressure Wire/FFR Study;  Surgeon: Peter M Martinique, MD;  Location: [place] [facility];  Service: Cardiovascular;  Laterality: N/A;  . CARDIAC CATHETERIZATION N/A 05/31/2016   Procedure: Coronary Stent Intervention;  Surgeon: Peter M Martinique, MD;  Location: [place] [facility] LAB;  Service: Cardiovascular;  Laterality: N/A;  . LEFT HEART CATH AND CORONARY ANGIOGRAPHY N/A 12/08/2017   Procedure: LEFT HEART CATH AND CORONARY ANGIOGRAPHY;  Surgeon: Leonie Man, MD;  Location: [place] [facility];  Service: Cardiovascular;  Laterality: N/A;  . LEFT HEART CATHETERIZATION WITH CORONARY ANGIOGRAM N/A 02/03/2014   Procedure: LEFT HEART CATHETERIZATION WITH CORONARY ANGIOGRAM;  Surgeon: Pixie Casino, MD;  Location: [facility] CATH LAB;  Service: Cardiovascular;  Laterality: N/A;  . left leg stent       There were no vitals filed for this visit.  Subjective Assessment - 12/25/18 0854    Subjective    Off steriods  ended last week.  Elbow still sore.    I am doing the exercises I like th e bands the best.   Hates supine exercises.  and  prone exercises  due to increased pain.                       [place] Adult PT Treatment/Exercise - 12/25/18 0001      Self-Care   Self-Care  Other Self-Care Comments    Other Self-Care Comments   anatomy hip flexor.      Lumbar Exercises: Stretches   Passive Hamstring Stretch  3 reps;20 seconds    Passive Hamstring Stretch Limitations  sitting  mod cues for trunk posture    Hip Flexor Stretch  2 reps;30 seconds;Left;Right    Hip Flexor Stretch Limitations  tried standing , bench pillow for knee      Lumbar Exercises: Aerobic   Nustep  L6 LE only       Lumbar Exercises: Standing   Row  20 reps   also extension cued for scapulare motion   Theraband Level (Row)  Level 4 (Blue)    Shoulder Extension  20 reps    Theraband Level  (Shoulder Extension)  Level 4 (Blue)    Other Standing Lumbar Exercises  [place] press  double blue band 20 X    Other Standing Lumbar Exercises  2 sets of 10 1 set with 6 LB barbell.  hurt shoulders      Lumbar Exercises: Seated   Other Seated Lumbar Exercises  on sit fit,  pelvic tilt,   tilt and hold hip flexion alternatoing each,  Hip IR/ER 5 X each with good posture.               PT Education - 12/25/18 1324    Education Details  Exercise form    Person(s) Educated  Patient    Methods  Explanation;Demonstration;Tactile cues;Verbal cues    Comprehension  Returned demonstration;Verbalized understanding       PT Short Term Goals - 12/11/18 0940      PT SHORT TERM GOAL #1   Title  Pt will be independent with HEP to address back pain, balance, and gait.  TARGET 12/15/17    Baseline  Band exer every day  other exercises 1-2days per week.     Status  Partially Met      PT SHORT TERM GOAL #2   Title  Pt will improve improve TUG score to less than or equal to 15 seconds for decreased fall risk.    Baseline  TUG 19.03 seconds (Scores >13.5 seconds indicate increased fall risk.) during eval and 12.05 sec. on 12/14/17    Status  Achieved      PT SHORT TERM GOAL #3   Title  Pt will verbalize understanding of posture/positioning with ADLs for improved functional mobility and decreased pain.    Status  Achieved      PT SHORT TERM GOAL #4   Title  Pt will improve gait velocity score to at least 2.62 ft/sec for improved efficiency and safety with gait.    Status  Achieved        PT Long Term Goals - 12/20/18 0926      PT LONG TERM GOAL #1   Title  Independent with HEP    Baseline  prefers standing, sitting HEP    Time  4    Period  Weeks    Status  On-going      PT LONG TERM GOAL #2   Title  Tolerate sitting 30 min or greater for eating meals, car travel    Baseline  45 min generally  Time  4    Period  Weeks    Status  Achieved      PT LONG TERM GOAL #3   Title   Tolerate walking 35-40 min for grocery shopping, IADLs    Baseline  close to an hour    Time  4    Period  Weeks    Status  Achieved      PT LONG TERM GOAL #4   Title  Right hip strength 5/5 to improve ability for lifting for chores at home    Baseline  4+/5 flex and ext    Time  4    Period  Weeks    Status  Partially Met      PT LONG TERM GOAL #5   Title  Increase trunk flexion AROM 5-10 deg to improve ability for bending for lifting for chores, donning shoes    Baseline  90 degrees flexion    Time  4    Period  Weeks    Status  Partially Met            Plan - 12/25/18 1325    Clinical Impression Statement  Exercise standing and sitting due to patient does not like to do exercises prone or supine.  Hip flexor is tight and probably is painful in these positions.  He needs work with pole for posture exercises  he declined the need for modalities for pain.   Moderate cues for some of his HEP for modifications.      PT Next Visit Plan    Hip hinge with pole. Monitor arm pain symptoms, Finalize HEP next 2 visits and discahrge.   Review squats and standing core (he does not like mat exercises. Prefers standing/seated.     PT Home Exercise Plan  pelvic tilts, SKTC, hip. add. isometrics, clamshell, hip bridges, hamstring and piriformis stretches, clam and reverse clam, ab set in standing , prone hip ext with pillows.   Row , extension snd cross chest pulls RT and LT green band    Consulted and Agree with Plan of Care  Patient       Patient will benefit from skilled therapeutic intervention in order to improve the following deficits and impairments:     Visit Diagnosis: Chronic bilateral low back pain without sciatica  Muscle weakness (generalized)  Abnormal posture  Unsteadiness on feet  Other abnormalities of gait and mobility  Chronic right-sided low back pain without sciatica     Problem List Patient Active Problem List   Diagnosis Date Noted  . Alteration  consciousness 11/19/2018  . Neck pain 10/09/2018  . Left arm weakness 10/09/2018  . B12 deficiency 08/10/2017  . Persistent headaches 08/09/2017  . GERD (gastroesophageal reflux disease) 06/29/2017  . Left arm numbness   . Cerebral embolism with cerebral infarction 06/17/2017  . TIA (transient ischemic attack) 06/17/2017  . Chronic back pain 12/29/2016  . Depression 12/15/2016  . Vitamin D deficiency 12/09/2016  . Right hip pain 12/07/2016  . Hypokalemia 12/07/2016  . Type 2 diabetes mellitus with vascular disease ([place]) 05/31/2016  . Normocytic normochromic anemia 05/31/2016  . Chest pain 05/31/2016  . CAD (coronary artery disease) 01/06/2016  . DVT (deep venous thrombosis) ([place]) 01/06/2016  . Lactic acidosis 05/28/2014  . Nonspecific chest pain 01/29/2014  . Uncontrolled secondary diabetes with peripheral neuropathy ([place]) 01/29/2014  . Obesity, Class III, BMI 40-49.9 (morbid obesity) ([place]) 01/29/2014  . Snoring 01/29/2014  . Dyslipidemia 01/29/2014  . HTN (hypertension) 01/29/2014  .  Abnormal nuclear stress test 01/29/2014    Joesphine Schemm  PTA 12/25/2018, 1:29 PM  [facility] [address] [place], [place], [address] Phone: [phone]   Fax:  [phone]  Name: [name] MRN: [id number] Date of Birth: 05-Nov-1960

## 2018-12-26 ENCOUNTER — Encounter: Payer: Self-pay | Admitting: Physical Therapy

## 2018-12-26 ENCOUNTER — Ambulatory Visit: Payer: Medicaid Other | Admitting: Physical Therapy

## 2018-12-26 VITALS — BP 133/70

## 2018-12-26 DIAGNOSIS — R2689 Other abnormalities of gait and mobility: Secondary | ICD-10-CM

## 2018-12-26 DIAGNOSIS — M545 Low back pain, unspecified: Secondary | ICD-10-CM

## 2018-12-26 DIAGNOSIS — G8929 Other chronic pain: Secondary | ICD-10-CM

## 2018-12-26 DIAGNOSIS — M6281 Muscle weakness (generalized): Secondary | ICD-10-CM

## 2018-12-26 DIAGNOSIS — R293 Abnormal posture: Secondary | ICD-10-CM

## 2018-12-26 DIAGNOSIS — R2681 Unsteadiness on feet: Secondary | ICD-10-CM

## 2018-12-26 NOTE — Therapy (Signed)
[place], [place], [address] Phone: [phone]   Fax:  [phone]  Physical Therapy Treatment  Patient Details  Name: [name] MRN: [id number] Date of Birth: 07-Dec-1960 Referring Provider (PT): Lowella Fairy Jerilee Hoh, FNP-C   Encounter Date: 12/26/2018  PT End of Session - 12/26/18 1128    Visit Number  14    Number of Visits  15    Date for PT Re-Evaluation  12/26/18    Authorization Type  Medicaid    Authorization Time Period  11/29/18-12/26/18    Authorization - Visit Number  7    Authorization - Number of Visits  8    PT Start Time  0848   short session due to patient dizzy,   PT Stop Time  0920    PT Time Calculation (min)  32 min    Activity Tolerance  Treatment limited secondary to medical complications (Comment)       Past Medical History:  Diagnosis Date  . Anemia   . Arthritis   . Back pain   . CAD (coronary artery disease)    a. s/p DES to LAD in 05/2016  . Cervical radiculopathy   . Chronic diastolic CHF (congestive heart failure) ([place])   . Chronic pain   . Depression   . DVT (deep venous thrombosis) ([place])   . Hematemesis   . Hepatic steatosis   . Hyperlipidemia   . Hypertension   . IBS (irritable bowel syndrome)   . Morbid obesity ([place])   . OSA (obstructive sleep apnea)   . Pancreatitis   . PE (pulmonary thromboembolism) ([place])   . Peripheral neuropathy   . PUD (peptic ulcer disease)   . Renal disorder   . Stroke [facility])    a. ?details unclear - not seen on imaging when he was admitted in 05/2017 for TIA symptoms which were felt due to cervical radiculopathy.  . Thoracic aortic ectasia (HCC)    a. 4.3cm ectatic ascending thoracic aorta by CT 06/2017.   . Type 2 diabetes mellitus ([name])     Past Surgical History:  Procedure Laterality Date  . CARDIAC CATHETERIZATION N/A 05/31/2016   Procedure: Left Heart Cath and Coronary Angiography;  Surgeon: Peter M Martinique, MD;  Location:  [place] [facility];  Service: Cardiovascular;  Laterality: N/A;  . CARDIAC CATHETERIZATION N/A 05/31/2016   Procedure: Intravascular Pressure Wire/FFR Study;  Surgeon: Peter M Martinique, MD;  Location: [place] CV LAB;  Service: Cardiovascular;  Laterality: N/A;  . CARDIAC CATHETERIZATION N/A 05/31/2016   Procedure: Coronary Stent Intervention;  Surgeon: Peter M Martinique, MD;  Location: [place] CV LAB;  Service: Cardiovascular;  Laterality: N/A;  . LEFT HEART CATH AND CORONARY ANGIOGRAPHY N/A 12/08/2017   Procedure: LEFT HEART CATH AND CORONARY ANGIOGRAPHY;  Surgeon: Leonie Man, MD;  Location: [place] CV LAB;  Service: Cardiovascular;  Laterality: N/A;  . LEFT HEART CATHETERIZATION WITH CORONARY ANGIOGRAM N/A 02/03/2014   Procedure: LEFT HEART CATHETERIZATION WITH CORONARY ANGIOGRAM;  Surgeon: Pixie Casino, MD;  Location: [facility] CATH LAB;  Service: Cardiovascular;  Laterality: N/A;  . left leg stent       Vitals:   12/26/18 0913  BP: 133/70    Subjective Assessment - 12/26/18 0905    Subjective  lrft arm pain getting worse                       [facility] Adult PT Treatment/Exercise - 12/26/18  0001      Self-Care   Other Self-Care Comments   Best time to exercise for diabetics is 1 hour after eating.       Lumbar Exercises: Stretches   Passive Hamstring Stretch  3 reps;20 seconds    Passive Hamstring Stretch Limitations  gentle        Lumbar Exercises: Aerobic   Nustep  L6 LE only 6 minutes      Lumbar Exercises: Standing   Functional Squats Limitations  5 reps 2 sets with long pole for neutral back able to do correctly      Lumbar Exercises: Seated   Other Seated Lumbar Exercises  on sit fit,  pelvic tilt,   tilt and hold hip flexion alternatoing each,  Hip I abd single leg  with green band ,  limite dissasociation  trunk from pelvis today.               PT Education - 12/26/18 1127    Education Details  self care:  best time to exercise for diabetics  is 1 hour after eating.    Person(s) Educated  Patient    Methods  Explanation    Comprehension  Verbalized understanding       PT Short Term Goals - 12/11/18 0940      PT SHORT TERM GOAL #1   Title  Pt will be independent with HEP to address back pain, balance, and gait.  TARGET 12/15/17    Baseline  Band exer every day  other exercises 1-2days per week.     Status  Partially Met      PT SHORT TERM GOAL #2   Title  Pt will improve improve TUG score to less than or equal to 15 seconds for decreased fall risk.    Baseline  TUG 19.03 seconds (Scores >13.5 seconds indicate increased fall risk.) during eval and 12.05 sec. on 12/14/17    Status  Achieved      PT SHORT TERM GOAL #3   Title  Pt will verbalize understanding of posture/positioning with ADLs for improved functional mobility and decreased pain.    Status  Achieved      PT SHORT TERM GOAL #4   Title  Pt will improve gait velocity score to at least 2.62 ft/sec for improved efficiency and safety with gait.    Status  Achieved        PT Long Term Goals - 12/26/18 1134      PT LONG TERM GOAL #1   Title  Independent with HEP    Baseline  prefers standing, sitting HEP    Time  4    Period  Weeks    Status  On-going      PT LONG TERM GOAL #2   Title  Tolerate sitting 30 min or greater for eating meals, car travel    Time  4    Period  Weeks    Status  Achieved      PT LONG TERM GOAL #3   Title  Tolerate walking 35-40 min for grocery shopping, IADLs    Baseline  close to an hour    Time  4    Period  Weeks    Status  Achieved      PT LONG TERM GOAL #4   Title  4    Period  Weeks    Status  Unable to assess      PT LONG TERM GOAL #5   Title  Increase trunk flexion AROM 5-10 deg to improve ability for bending for lifting for chores, donning shoes    Time  4    Period  Weeks    Status  Unable to assess            Plan - 12/26/18 1130    Clinical Impression Statement  Session started well with patient  being able to learn good form for squatting.  Then he became dizzy.  BP taken and was in the normal range,  see flow sheet.  Session shortened due to patient not feeling good about continueing exercise.  Dizziness may be due to eating 2.5 hours prior to PT vs the optimal 1 hour.  He palns to follow up with his MD.  today.  P.O.C. ends today. He wanted 1 more visit , so he was asked to schedule with his PT.  Goals not assessed due to short session..  Patient  did not leave until his dizziness was resolved..    PT Next Visit Plan    Hip hinge with pole. Monitor arm pain symptoms, Finalize HEP next 2 visits and discahrge.   Review squats and standing core (he does not like mat exercises. Prefers standing/seated.        Patient will benefit from skilled therapeutic intervention in order to improve the following deficits and impairments:     Visit Diagnosis: Chronic bilateral low back pain without sciatica  Muscle weakness (generalized)  Abnormal posture  Unsteadiness on feet  Other abnormalities of gait and mobility  Chronic right-sided low back pain without sciatica     Problem List Patient Active Problem List   Diagnosis Date Noted  . Alteration consciousness 11/19/2018  . Neck pain 10/09/2018  . Left arm weakness 10/09/2018  . B12 deficiency 08/10/2017  . Persistent headaches 08/09/2017  . GERD (gastroesophageal reflux disease) 06/29/2017  . Left arm numbness   . Cerebral embolism with cerebral infarction 06/17/2017  . TIA (transient ischemic attack) 06/17/2017  . Chronic back pain 12/29/2016  . Depression 12/15/2016  . Vitamin D deficiency 12/09/2016  . Right hip pain 12/07/2016  . Hypokalemia 12/07/2016  . Type 2 diabetes mellitus with vascular disease ([place]) 05/31/2016  . Normocytic normochromic anemia 05/31/2016  . Chest pain 05/31/2016  . CAD (coronary artery disease) 01/06/2016  . DVT (deep venous thrombosis) ([place]) 01/06/2016  . Lactic acidosis 05/28/2014  .  Nonspecific chest pain 01/29/2014  . Uncontrolled secondary diabetes with peripheral neuropathy ([place]) 01/29/2014  . Obesity, Class III, BMI 40-49.9 (morbid obesity) ([place]) 01/29/2014  . Snoring 01/29/2014  . Dyslipidemia 01/29/2014  . HTN (hypertension) 01/29/2014  . Abnormal nuclear stress test 01/29/2014    [facility] PTA 12/26/2018, 11:41 AM  [facility] [address] [place], [place], [address] Phone: [phone]   Fax:  [phone]  Name: TRAVAS SCHEXNAYDER MRN: [id number] Date of Birth: 1961/01/23

## 2018-12-30 ENCOUNTER — Other Ambulatory Visit: Payer: Self-pay

## 2018-12-30 ENCOUNTER — Emergency Department (HOSPITAL_COMMUNITY)
Admission: EM | Admit: 2018-12-30 | Discharge: 2018-12-30 | Disposition: A | Discharge status: Home / Self Care | Payer: Medicaid Other | Attending: Emergency Medicine | Admitting: Emergency Medicine

## 2018-12-30 ENCOUNTER — Encounter (HOSPITAL_COMMUNITY): Payer: Self-pay | Admitting: Emergency Medicine

## 2018-12-30 DIAGNOSIS — E785 Hyperlipidemia, unspecified: Secondary | ICD-10-CM | POA: Diagnosis not present

## 2018-12-30 DIAGNOSIS — I110 Hypertensive heart disease with heart failure: Secondary | ICD-10-CM | POA: Diagnosis not present

## 2018-12-30 DIAGNOSIS — I5032 Chronic diastolic (congestive) heart failure: Secondary | ICD-10-CM | POA: Diagnosis not present

## 2018-12-30 DIAGNOSIS — Z7982 Long term (current) use of aspirin: Secondary | ICD-10-CM | POA: Diagnosis not present

## 2018-12-30 DIAGNOSIS — M109 Gout, unspecified: Secondary | ICD-10-CM

## 2018-12-30 DIAGNOSIS — I2510 Atherosclerotic heart disease of native coronary artery without angina pectoris: Secondary | ICD-10-CM | POA: Diagnosis not present

## 2018-12-30 DIAGNOSIS — Z8673 Personal history of transient ischemic attack (TIA), and cerebral infarction without residual deficits: Secondary | ICD-10-CM | POA: Diagnosis not present

## 2018-12-30 DIAGNOSIS — Z86718 Personal history of other venous thrombosis and embolism: Secondary | ICD-10-CM | POA: Diagnosis not present

## 2018-12-30 DIAGNOSIS — Z794 Long term (current) use of insulin: Secondary | ICD-10-CM | POA: Diagnosis not present

## 2018-12-30 DIAGNOSIS — Z79899 Other long term (current) drug therapy: Secondary | ICD-10-CM | POA: Insufficient documentation

## 2018-12-30 DIAGNOSIS — E119 Type 2 diabetes mellitus without complications: Secondary | ICD-10-CM | POA: Insufficient documentation

## 2018-12-30 DIAGNOSIS — M25522 Pain in left elbow: Secondary | ICD-10-CM | POA: Diagnosis present

## 2018-12-30 MED ORDER — PREDNISONE 50 MG PO TABS: 50.0000 mg | ORAL_TABLET | Freq: Every day | ORAL | 0 refills | Status: DC

## 2018-12-30 MED ORDER — COLCHICINE 0.6 MG PO TABS: 0.6000 mg | ORAL_TABLET | Freq: Two times a day (BID) | ORAL | 1 refills | Status: DC

## 2018-12-30 NOTE — Discharge Instructions (Addendum)
Take the medication as prescribed to help with the inflammation in your left elbow joint, monitor your blood sugar while you are taking the prednisone as it can make it run higher, follow-up with your primary care doctor or orthopedic doctor for further evaluation as we discussed

## 2018-12-30 NOTE — ED Provider Notes (Signed)
[facility] EMERGENCY DEPARTMENT Provider Note   CSN: [id number] Arrival date & time: 12/30/18  1145    History   Chief Complaint Chief Complaint  Patient presents with  . Arm Pain    HPI [name] is a 58 y.o. male.   HPI Patient presents to the emergency room for evaluation of left elbow pain.  Patient states he is had this problem off and on for months.  Pain increases when he tries to bend his elbow or prop it on a table.  Patient denies any recent falls or injuries.  He was seen a few weeks ago for similar symptoms and had x-rays that suggested some joint inflammation.  Patient states he took prednisone which seemed to help but the symptoms returned.  Patient does have an orthopedic doctor but has not seen him for this particular issue.  He denies any fevers or chills.  No chest pain or shortness of breath. Past Medical History:  Diagnosis Date  . Anemia   . Arthritis   . Back pain   . CAD (coronary artery disease)    a. s/p DES to LAD in 05/2016  . Cervical radiculopathy   . Chronic diastolic CHF (congestive heart failure) ([place])   . Chronic pain   . Depression   . DVT (deep venous thrombosis) ([place])   . Hematemesis   . Hepatic steatosis   . Hyperlipidemia   . Hypertension   . IBS (irritable bowel syndrome)   . Morbid obesity ([place])   . OSA (obstructive sleep apnea)   . Pancreatitis   . PE (pulmonary thromboembolism) ([place])   . Peripheral neuropathy   . PUD (peptic ulcer disease)   . Renal disorder   . Stroke [facility])    a. ?details unclear - not seen on imaging when he was admitted in 05/2017 for TIA symptoms which were felt due to cervical radiculopathy.  . Thoracic aortic ectasia (HCC)    a. 4.3cm ectatic ascending thoracic aorta by CT 06/2017.   . Type 2 diabetes mellitus [facility])     Patient Active Problem List   Diagnosis Date Noted  . Alteration consciousness 11/19/2018  . Neck pain 10/09/2018  . Left arm weakness 10/09/2018  . B12  deficiency 08/10/2017  . Persistent headaches 08/09/2017  . GERD (gastroesophageal reflux disease) 06/29/2017  . Left arm numbness   . Cerebral embolism with cerebral infarction 06/17/2017  . TIA (transient ischemic attack) 06/17/2017  . Chronic back pain 12/29/2016  . Depression 12/15/2016  . Vitamin D deficiency 12/09/2016  . Right hip pain 12/07/2016  . Hypokalemia 12/07/2016  . Type 2 diabetes mellitus with vascular disease ([place]) 05/31/2016  . Normocytic normochromic anemia 05/31/2016  . Chest pain 05/31/2016  . CAD (coronary artery disease) 01/06/2016  . DVT (deep venous thrombosis) ([place]) 01/06/2016  . Lactic acidosis 05/28/2014  . Nonspecific chest pain 01/29/2014  . Uncontrolled secondary diabetes with peripheral neuropathy ([place]) 01/29/2014  . Obesity, Class III, BMI 40-49.9 (morbid obesity) ([place]) 01/29/2014  . Snoring 01/29/2014  . Dyslipidemia 01/29/2014  . HTN (hypertension) 01/29/2014  . Abnormal nuclear stress test 01/29/2014    Past Surgical History:  Procedure Laterality Date  . CARDIAC CATHETERIZATION N/A 05/31/2016   Procedure: Left Heart Cath and Coronary Angiography;  Surgeon: Peter M Martinique, MD;  Location: [place] CV LAB;  Service: Cardiovascular;  Laterality: N/A;  . CARDIAC CATHETERIZATION N/A 05/31/2016   Procedure: Intravascular Pressure Wire/FFR Study;  Surgeon: Peter M Martinique, MD;  Location: [place] CV LAB;  Service: Cardiovascular;  Laterality: N/A;  . CARDIAC CATHETERIZATION N/A 05/31/2016   Procedure: Coronary Stent Intervention;  Surgeon: Peter M Martinique, MD;  Location: [place] CV LAB;  Service: Cardiovascular;  Laterality: N/A;  . LEFT HEART CATH AND CORONARY ANGIOGRAPHY N/A 12/08/2017   Procedure: LEFT HEART CATH AND CORONARY ANGIOGRAPHY;  Surgeon: Leonie Man, MD;  Location: [place] CV LAB;  Service: Cardiovascular;  Laterality: N/A;  . LEFT HEART CATHETERIZATION WITH CORONARY ANGIOGRAM N/A 02/03/2014   Procedure: LEFT HEART  CATHETERIZATION WITH CORONARY ANGIOGRAM;  Surgeon: Pixie Casino, MD;  Location: [facility] C[facility] LAB;  Service: Cardiovascular;  Laterality: N/A;  . left leg stent           Home Medications    Prior to Admission medications   Medication Sig Start Date End Date Taking? Authorizing Provider  Albuterol Sulfate (PROAIR RESPICLICK) 998 (90 Base) MCG/ACT AEPB Inhale 2 puffs into the lungs every 6 (six) hours as needed. 11/06/18   Robyn Haber, MD  aspirin 81 MG chewable tablet Chew 1 tablet (81 mg total) by mouth daily. 06/01/16   Elwin Mocha, MD  atorvastatin (LIPITOR) 80 MG tablet Take 1 tablet (80 mg total) by mouth daily. Patient taking differently: Take 80 mg by mouth daily at 6 PM.  01/26/17   Strader, Fransisco Hertz, PA-C  carvedilol (COREG) 25 MG tablet Take 1 tablet (25 mg total) by mouth 2 (two) times daily with a meal. 01/26/17   Strader, [place] M, PA-C  colchicine 0.6 MG tablet Take 1 tablet (0.6 mg total) by mouth 2 (two) times daily. 12/30/18   Dorie Rank, MD  dapagliflozin propanediol (FARXIGA) 5 MG TABS tablet Take 5 mg by mouth daily.    [provider]  diclofenac sodium (VOLTAREN) 1 % GEL Apply 1 application topically 4 (four) times daily. 04/05/18   Melynda Ripple, MD  dicyclomine (BENTYL) 20 MG tablet Take 1 tablet (20 mg total) by mouth 2 (two) times daily. 06/06/18   Palumbo, April, MD  DULoxetine (CYMBALTA) 60 MG capsule TAKE 1 CAPSULE BY MOUTH EVERY DAY 12/24/18   Marcial Pacas, MD  famotidine (PEPCID) 40 MG tablet Take 1 tablet (40 mg total) by mouth at bedtime. Patient taking differently: Take 40 mg by mouth daily.  06/18/17   Barton Dubois, MD  furosemide (LASIX) 40 MG tablet Take 1 tablet (40 mg total) by mouth every evening. Patient taking differently: Take 40 mg by mouth 3 (three) times daily.  12/09/17   Velvet Bathe, MD  glipiZIDE (GLUCOTROL) 10 MG tablet Take 10 mg by mouth 2 (two) times daily. 05/30/17   [provider]  hydrALAZINE (APRESOLINE) 25 MG  tablet Take 3 tablets (75 mg total) by mouth 3 (three) times daily. 07/19/18   Lelon Perla, MD  Insulin Detemir (LEVEMIR) 100 UNIT/ML Pen Inject 20 Units into the skin daily at 10 pm. Patient taking differently: Inject 55 Units into the skin daily at 10 pm.  06/18/17   Barton Dubois, MD  Insulin Pen Needle 31G X 5 MM MISC Use 1 needle daily to inject insulin as prescribed 06/18/17   Barton Dubois, MD  isosorbide dinitrate (ISORDIL) 30 MG tablet Take 1 tablet (30 mg total) by mouth every morning. 12/10/17   Velvet Bathe, MD  isosorbide mononitrate (IMDUR) 30 MG 24 hr tablet Take 30 mg by mouth daily. 06/27/18   [provider]  lisinopril (PRINIVIL,ZESTRIL) 10 MG tablet Take 1 tablet (10 mg  total) by mouth daily. Patient taking differently: Take 40 mg by mouth daily.  12/10/17   Velvet Bathe, MD  LYRICA 100 MG capsule Take 1 capsule (100 mg total) by mouth 2 (two) times daily. 10/09/18   Marcial Pacas, MD  metFORMIN (GLUCOPHAGE-XR) 500 MG 24 hr tablet Take 2 tablets (1,000 mg total) by mouth 2 (two) times daily. 07/02/17   Barton Dubois, MD  nitroGLYCERIN (NITROSTAT) 0.4 MG SL tablet Place 0.4 mg under the tongue every 5 (five) minutes as needed for chest pain. 12/17/16   [provider]  oxyCODONE (ROXICODONE) 15 MG immediate release tablet Take 15 mg 3 (three) times daily as needed by mouth for pain.  08/28/17   [provider]  pantoprazole (PROTONIX) 40 MG tablet Take 1 tablet (40 mg total) by mouth 2 (two) times daily. 06/18/17   Barton Dubois, MD  potassium chloride (MICRO-K) 10 MEQ CR capsule Take 20 mEq by mouth 3 (three) times daily with meals. 20 meq at lunch, 10 meq at dinner and 10 meq at bedtime 06/28/18   [provider]  potassium chloride SA (K-DUR,KLOR-CON) 20 MEQ tablet Take 20-40 mEq by mouth daily. 40 meq in the lunch, 20 med at dinner and 20 meq at bedtime    [provider]  predniSONE (DELTASONE) 50 MG tablet Take 1 tablet (50 mg total) by  mouth daily. 12/30/18   Dorie Rank, MD  sertraline (ZOLOFT) 50 MG tablet Take 50 mg by mouth daily.    [provider]  tiZANidine (ZANAFLEX) 2 MG tablet Take 2 mg by mouth 2 (two) times daily as needed for pain.    [provider]  traZODone (DESYREL) 50 MG tablet Take 1 tablet (50 mg total) by mouth at bedtime. 12/15/16   Florinda Marker, MD    Family History Family History  Problem Relation Age of Onset  . Cancer Father   . Hypertension Mother   . Diabetes Mother   . Breast cancer Mother   . Hypertension Brother   . Diabetes Brother   . Hypertension Sister   . Diabetes Sister     Social History Social History   Tobacco Use  . Smoking status: Never Smoker  . Smokeless tobacco: Never Used  Substance Use Topics  . Alcohol use: No  . Drug use: No     Allergies   Coconut flavor [flavoring agent]; Coconut oil; Ibuprofen; Aleve [naproxen]; and Nsaids   Review of Systems Review of Systems  All other systems reviewed and are negative.    Physical Exam Updated Vital Signs BP (!) 118/54 (BP Location: Left Arm)   Pulse 89   Temp 99.6 F (37.6 C) (Oral)   Resp 16   Ht 1.778 m (5\' 10" )   Wt 133.8 kg   SpO2 96%   BMI 42.33 kg/m   Physical Exam Vitals signs and nursing note reviewed.  Constitutional:      General: He is not in acute distress.    Appearance: He is well-developed.  HENT:     Head: Normocephalic and atraumatic.     Right Ear: External ear normal.     Left Ear: External ear normal.  Eyes:     General: No scleral icterus.       Right eye: No discharge.        Left eye: No discharge.     Conjunctiva/sclera: Conjunctivae normal.  Neck:     Musculoskeletal: Neck supple.     Trachea: No tracheal deviation.  Cardiovascular:     Rate and Rhythm: Normal rate.  Pulmonary:     Effort: Pulmonary effort is normal. No respiratory distress.     Breath sounds: No stridor.  Abdominal:     General: There is no distension.  Musculoskeletal:         General: Tenderness present. No swelling, deformity or signs of injury.     Comments: Mild tenderness palpation left elbow, pain with range of motion, no evidence of erythema, extremities are warm and well perfused, normal sensation and strength  Skin:    General: Skin is warm and dry.     Findings: No rash.  Neurological:     Mental Status: He is alert.     Cranial Nerves: Cranial nerve deficit: no gross deficits.      ED Treatments / Results  Labs (all labs ordered are listed, but only abnormal results are displayed) Labs Reviewed - No data to display  EKG None  Radiology No results found.  Procedures Procedures (including critical care time)  Medications Ordered in ED Medications - No data to display   Initial Impression / Assessment and Plan / ED Course  I have reviewed the triage vital signs and the nursing notes.  Pertinent labs & imaging results that were available during my care of the patient were reviewed by me and considered in my medical decision making (see chart for details).   Reviewed the x-rays that the patient had from a few weeks ago.  There was the suggestion of a joint effusion.  Patient did notice improvement after he took the prior course of steroids.  I suspect he could be having gouty arthropathy.  Plan on discharge home with prednisone and colchicine.  Outpatient follow-up with his primary care doctor or orthopedic doctor.  Final Clinical Impressions(s) / ED Diagnoses   Final diagnoses:  Gouty arthritis    ED Discharge Orders         Ordered    colchicine 0.6 MG tablet  2 times daily     12/30/18 1246    predniSONE (DELTASONE) 50 MG tablet  Daily     12/30/18 1246           Dorie Rank, MD 12/30/18 1248

## 2018-12-30 NOTE — ED Notes (Signed)
Patient verbalizes understanding of discharge instructions. Opportunity for questioning and answers were provided. Armband removed by staff, pt discharged from ED ambulatory.   

## 2018-12-30 NOTE — ED Triage Notes (Signed)
The patient said he has been having left arm pain.  He has been seen for it before and was told he has arthritis and has seen ortho specialist.  He is here today with the same pain and rate 8/10.  He has not taken anything for the pain.   Patient says his arm is swollen.  Has good pulse.

## 2019-01-09 ENCOUNTER — Encounter: Payer: Self-pay | Admitting: Physical Therapy

## 2019-01-09 ENCOUNTER — Other Ambulatory Visit: Payer: Self-pay

## 2019-01-09 ENCOUNTER — Ambulatory Visit: Payer: Medicaid Other | Admitting: Physical Therapy

## 2019-01-09 DIAGNOSIS — M6281 Muscle weakness (generalized): Secondary | ICD-10-CM

## 2019-01-09 DIAGNOSIS — M545 Low back pain, unspecified: Secondary | ICD-10-CM

## 2019-01-09 DIAGNOSIS — G8929 Other chronic pain: Secondary | ICD-10-CM

## 2019-01-09 NOTE — Therapy (Signed)
[place], [place], [address] Phone: [phone]   Fax:  [phone]  Physical Therapy Treatment/Re-evaluation/Discharge  Patient Details  Name: [name] MRN: [id number] Date of Birth: 02-08-1961 Referring Provider (PT): Lowella Fairy Jerilee Hoh, FNP-C   Encounter Date: 01/09/2019  PT End of Session - 01/09/19 0909    Visit Number  15    Number of Visits  15    Date for PT Re-Evaluation  --   re-eval today   Authorization Type  Medicaid    PT Start Time  0840    PT Stop Time  0918    PT Time Calculation (min)  38 min    Activity Tolerance  Treatment limited secondary to medical complications (Comment)    Behavior During Therapy  [facility] for tasks assessed/performed       Past Medical History:  Diagnosis Date  . Anemia   . Arthritis   . Back pain   . CAD (coronary artery disease)    a. s/p DES to LAD in 05/2016  . Cervical radiculopathy   . Chronic diastolic CHF (congestive heart failure) ([place])   . Chronic pain   . Depression   . DVT (deep venous thrombosis) ([place])   . Hematemesis   . Hepatic steatosis   . Hyperlipidemia   . Hypertension   . IBS (irritable bowel syndrome)   . Morbid obesity ([place])   . OSA (obstructive sleep apnea)   . Pancreatitis   . PE (pulmonary thromboembolism) ([place])   . Peripheral neuropathy   . PUD (peptic ulcer disease)   . Renal disorder   . Stroke [facility])    a. ?details unclear - not seen on imaging when he was admitted in 05/2017 for TIA symptoms which were felt due to cervical radiculopathy.  . Thoracic aortic ectasia (HCC)    a. 4.3cm ectatic ascending thoracic aorta by CT 06/2017.   . Type 2 diabetes mellitus ([place])     Past Surgical History:  Procedure Laterality Date  . CARDIAC CATHETERIZATION N/A 05/31/2016   Procedure: Left Heart Cath and Coronary Angiography;  Surgeon: Peter M Martinique, MD;  Location: [place] CV LAB;  Service: Cardiovascular;  Laterality: N/A;   . CARDIAC CATHETERIZATION N/A 05/31/2016   Procedure: Intravascular Pressure Wire/FFR Study;  Surgeon: Peter M Martinique, MD;  Location: [place] CV LAB;  Service: Cardiovascular;  Laterality: N/A;  . CARDIAC CATHETERIZATION N/A 05/31/2016   Procedure: Coronary Stent Intervention;  Surgeon: Peter M Martinique, MD;  Location: [place] CV LAB;  Service: Cardiovascular;  Laterality: N/A;  . LEFT HEART CATH AND CORONARY ANGIOGRAPHY N/A 12/08/2017   Procedure: LEFT HEART CATH AND CORONARY ANGIOGRAPHY;  Surgeon: Leonie Man, MD;  Location: [place] CV LAB;  Service: Cardiovascular;  Laterality: N/A;  . LEFT HEART CATHETERIZATION WITH CORONARY ANGIOGRAM N/A 02/03/2014   Procedure: LEFT HEART CATHETERIZATION WITH CORONARY ANGIOGRAM;  Surgeon: Pixie Casino, MD;  Location[facility] CATH LAB;  Service: Cardiovascular;  Laterality: N/A;  . left leg stent       There were no vitals filed for this visit.  Subjective Assessment - 01/09/19 0842    Subjective  Pt. went to ED last week for elbow and was diagnosed with suspected gout. He reports pain including back and hip pain increased after fall last Friday with impact to right hip region.     Pertinent History  chronic LBP history, left cervical radiculopathy pending injections    Currently in Pain?  Yes    Pain Score  10-Worst pain ever    Pain Location  Arm    Pain Orientation  Left    Pain Descriptors / Indicators  Sharp;Aching    Pain Type  Chronic pain    Pain Radiating Towards  elbow    Pain Frequency  Constant    Aggravating Factors   no specific aggs noted    Pain Relieving Factors  topical patch    Effect of Pain on Daily Activities  limits ability left UE use for reaching ADLs, lifting activities    Multiple Pain Sites  Yes    Pain Score  7    Pain Location  Back    Pain Orientation  Lower    Pain Descriptors / Indicators  Sharp;Aching    Pain Type  Chronic pain    Pain Onset  More than a month ago    Pain Frequency  Constant     Aggravating Factors   bending, sitting    Pain Relieving Factors  medication    Effect of Pain on Daily Activities  limits positional tolerance and ability bending    Pain Score  7    Pain Location  Hip    Pain Orientation  Right    Pain Descriptors / Indicators  Sharp;Aching    Pain Type  Chronic pain    Pain Onset  --   acute on chronic exacerbation 01/04/19   Pain Frequency  Constant    Aggravating Factors   no specific aggs noted    Pain Relieving Factors  medication         [facility] PT Assessment - 01/09/19 0001      AROM   Right Hip Flexion  105    Right Hip External Rotation   42    Right Hip Internal Rotation   6    Right Hip ABduction  40    Right Hip ADduction  --   [facility]   Lumbar Flexion  90   able to touch toes   Lumbar Extension  27    Lumbar - Right Side Bend  28    Lumbar - Left Side Bend  30    Lumbar - Right Rotation  50%    Lumbar - Left Rotation  70%      Strength   Right Hip Flexion  5/5    Right Hip Extension  4+/5    Right Hip External Rotation   5/5    Right Hip Internal Rotation  5/5    Right Hip ABduction  4+/5                   [facility] Adult PT Treatment/Exercise - 01/09/19 0001      Lumbar Exercises: Stretches   Passive Hamstring Stretch  Right;Left;3 reps;30 seconds    Single Knee to Chest Stretch  Right;Left;3 reps;20 seconds      Lumbar Exercises: Aerobic   Nustep  L3 x 6 min LE only      Lumbar Exercises: Supine   Clam  20 reps    Clam Limitations  blue band    Bent Knee Raise  15 reps    Bridge with Ball Squeeze  20 reps      Manual Therapy   Joint Mobilization  Right hip long axis distraction grade I-IV             PT Education - 01/09/19 0909    Education Details  POC, HEP  Person(s) Educated  Patient    Methods  Explanation    Comprehension  Verbalized understanding       PT Short Term Goals - 01/09/19 0927      PT SHORT TERM GOAL #1   Title  Pt will be independent with HEP to address back pain,  balance, and gait.  TARGET 12/15/17    Baseline  Band exer every day  other exercises 1-2days per week.     Time  3    Period  Weeks    Status  Achieved      PT SHORT TERM GOAL #2   Title  Pt will improve improve TUG score to less than or equal to 15 seconds for decreased fall risk.    Baseline  TUG 19.03 seconds (Scores >13.5 seconds indicate increased fall risk.) during eval and 12.05 sec. on 12/14/17    Time  3    Period  Weeks    Status  Achieved        PT Long Term Goals - 01/09/19 4239      PT LONG TERM GOAL #1   Title  Independent with HEP    Baseline  met    Time  4    Period  Weeks    Status  Achieved      PT LONG TERM GOAL #2   Title  Tolerate sitting 30 min or greater for eating meals, car travel    Baseline  45 min generally    Time  4    Period  Weeks    Status  Achieved      PT LONG TERM GOAL #3   Title  Tolerate walking 35-40 min for grocery shopping, IADLs    Baseline  met    Time  4    Period  Weeks    Status  Achieved      PT LONG TERM GOAL #4   Title  Right hip strength 5/5 to improve ability for lifting for chores, IADLs    Baseline  see flowsheet    Time  4    Period  Weeks    Status  Partially Met      PT LONG TERM GOAL #5   Title  Increase trunk flexion AROM 5-10 deg to improve ability for bending for lifting for chores, donning shoes    Baseline  90 degrees flexion    Time  4    Period  Weeks    Status  Achieved            Plan - 01/09/19 0925    Clinical Impression Statement  P.t has attended 15 therapy session for chronic LBP issues. Though recent setback secondary to fall otherwise functional goals have been met (all goals met excepting LTG for hip strength partially met). Expect at this point pt. can continue progress via HEP, recommend follow up with MD with any changes in status.    Stability/Clinical Decision Making  Evolving/Moderate complexity    Clinical Decision Making  Moderate    Rehab Potential  Fair    Clinical  Impairments Affecting Rehab Potential  chronic pain history, past therapy, comorbidities    PT Frequency  2x / week    PT Duration  4 weeks    PT Treatment/Interventions  ADLs/Self Care Home Management;Cryotherapy;Electrical Stimulation;Ultrasound;Moist Heat;Traction;Neuromuscular re-education;Functional mobility training;Therapeutic activities;Therapeutic exercise;Patient/family education;Manual techniques;Dry needling;Taping    PT Next Visit Plan  NA    PT Home Exercise Plan  pelvic tilts, SKTC, hip.  add. isometrics, clamshell, hip bridges, hamstring and piriformis stretches, clam and reverse clam, ab set in standing , prone hip ext with pillows.   Row , extension snd cross chest pulls RT and LT green band    Consulted and Agree with Plan of Care  Patient       Patient will benefit from skilled therapeutic intervention in order to improve the following deficits and impairments:  Pain, Impaired sensation, Decreased activity tolerance, Decreased endurance, Decreased range of motion, Decreased strength, Obesity, Difficulty walking  Visit Diagnosis: Chronic bilateral low back pain without sciatica - Plan: PT plan of care cert/re-cert  Muscle weakness (generalized) - Plan: PT plan of care cert/re-cert     Problem List Patient Active Problem List   Diagnosis Date Noted  . Alteration consciousness 11/19/2018  . Neck pain 10/09/2018  . Left arm weakness 10/09/2018  . B12 deficiency 08/10/2017  . Persistent headaches 08/09/2017  . GERD (gastroesophageal reflux disease) 06/29/2017  . Left arm numbness   . Cerebral embolism with cerebral infarction 06/17/2017  . TIA (transient ischemic attack) 06/17/2017  . Chronic back pain 12/29/2016  . Depression 12/15/2016  . Vitamin D deficiency 12/09/2016  . Right hip pain 12/07/2016  . Hypokalemia 12/07/2016  . Type 2 diabetes mellitus with vascular disease ([place]) 05/31/2016  . Normocytic normochromic anemia 05/31/2016  . Chest pain 05/31/2016  .  CAD (coronary artery disease) 01/06/2016  . DVT (deep venous thrombosis) ([place]) 01/06/2016  . Lactic acidosis 05/28/2014  . Nonspecific chest pain 01/29/2014  . Uncontrolled secondary diabetes with peripheral neuropathy ([place]) 01/29/2014  . Obesity, Class III, BMI 40-49.9 (morbid obesity) ([place]) 01/29/2014  . Snoring 01/29/2014  . Dyslipidemia 01/29/2014  . HTN (hypertension) 01/29/2014  . Abnormal nuclear stress test 01/29/2014        PHYSICAL THERAPY DISCHARGE SUMMARY  Visits from Start of Care: 15  Current functional level related to goals / functional outcomes: See above-exacerbation of pain s/p fall but functional goals met   Remaining deficits: Right hip weakness and stiffness   Education / Equipment: HEP Plan: Patient agrees to discharge.  Patient goals were partially met. Patient is being discharged due to meeting the stated rehab goals.  ?????          Beaulah Dinning, PT, DPT 01/09/19 9:32 AM     [facility] [address] [place], [place], [address] Phone: [phone]   Fax:  [phone]  Name: [name] MRN: [id number] Date of Birth: 04/15/1961

## 2019-01-09 NOTE — Therapy (Signed)
[place], [place], [address] Phone: [phone]   Fax:  [phone]  Physical Therapy Treatment/Re-evaluation  Patient Details  Name: [name] MRN: [id number] Date of Birth: 1961/05/07 Referring Provider (PT): Lowella Fairy Jerilee Hoh, FNP-C   Encounter Date: 01/09/2019  PT End of Session - 01/09/19 0909    Visit Number  15    Number of Visits  15    Date for PT Re-Evaluation  --   re-eval today   Authorization Type  Medicaid    PT Start Time  0840    PT Stop Time  0918    PT Time Calculation (min)  38 min    Activity Tolerance  Treatment limited secondary to medical complications (Comment)    Behavior During Therapy  [facility] for tasks assessed/performed       Past Medical History:  Diagnosis Date  . Anemia   . Arthritis   . Back pain   . CAD (coronary artery disease)    a. s/p DES to LAD in 05/2016  . Cervical radiculopathy   . Chronic diastolic CHF (congestive heart failure) ([place])   . Chronic pain   . Depression   . DVT (deep venous thrombosis) ([place])   . Hematemesis   . Hepatic steatosis   . Hyperlipidemia   . Hypertension   . IBS (irritable bowel syndrome)   . Morbid obesity ([place])   . OSA (obstructive sleep apnea)   . Pancreatitis   . PE (pulmonary thromboembolism) ([place])   . Peripheral neuropathy   . PUD (peptic ulcer disease)   . Renal disorder   . Stroke [facility])    a. ?details unclear - not seen on imaging when he was admitted in 05/2017 for TIA symptoms which were felt due to cervical radiculopathy.  . Thoracic aortic ectasia (HCC)    a. 4.3cm ectatic ascending thoracic aorta by CT 06/2017.   . Type 2 diabetes mellitus ([place])     Past Surgical History:  Procedure Laterality Date  . CARDIAC CATHETERIZATION N/A 05/31/2016   Procedure: Left Heart Cath and Coronary Angiography;  Surgeon: Peter M Martinique, MD;  Location: [place] [facility];  Service: Cardiovascular;  Laterality: N/A;  .  CARDIAC CATHETERIZATION N/A 05/31/2016   Procedure: Intravascular Pressure Wire/FFR Study;  Surgeon: Peter M Martinique, MD;  Location: [place] [facility];  Service: Cardiovascular;  Laterality: N/A;  . CARDIAC CATHETERIZATION N/A 05/31/2016   Procedure: Coronary Stent Intervention;  Surgeon: Peter M Martinique, MD;  Location: [place] [facility];  Service: Cardiovascular;  Laterality: N/A;  . LEFT HEART CATH AND CORONARY ANGIOGRAPHY N/A 12/08/2017   Procedure: LEFT HEART CATH AND CORONARY ANGIOGRAPHY;  Surgeon: Leonie Man, MD;  Location: [place] [facility];  Service: Cardiovascular;  Laterality: N/A;  . LEFT HEART CATHETERIZATION WITH CORONARY ANGIOGRAM N/A 02/03/2014   Procedure: LEFT HEART CATHETERIZATION WITH CORONARY ANGIOGRAM;  Surgeon: Pixie Casino, MD;  Location: [facility] CATH LAB;  Service: Cardiovascular;  Laterality: N/A;  . left leg stent       There were no vitals filed for this visit.  Subjective Assessment - 01/09/19 0842    Subjective  Pt. went to ED last week for elbow and was diagnosed with suspected gout. He reports pain including back and hip pain increased after fall last Friday with impact to right hip region.     Pertinent History  chronic LBP history, left cervical radiculopathy pending injections    Currently in Pain?  Yes    Pain Score  10-Worst pain ever    Pain Location  Arm    Pain Orientation  Left    Pain Descriptors / Indicators  Sharp;Aching    Pain Type  Chronic pain    Pain Radiating Towards  elbow    Pain Frequency  Constant    Aggravating Factors   no specific aggs noted    Pain Relieving Factors  topical patch    Effect of Pain on Daily Activities  limits ability left UE use for reaching ADLs, lifting activities    Multiple Pain Sites  Yes    Pain Score  7    Pain Location  Back    Pain Orientation  Lower    Pain Descriptors / Indicators  Sharp;Aching    Pain Type  Chronic pain    Pain Onset  More than a month ago    Pain Frequency  Constant     Aggravating Factors   bending, sitting    Pain Relieving Factors  medication    Effect of Pain on Daily Activities  limits positional tolerance and ability bending    Pain Score  7    Pain Location  Hip    Pain Orientation  Right    Pain Descriptors / Indicators  Sharp;Aching    Pain Type  Chronic pain    Pain Onset  --   acute on chronic exacerbation 01/04/19   Pain Frequency  Constant    Aggravating Factors   no specific aggs noted    Pain Relieving Factors  medication         [facility] PT Assessment - 01/09/19 0001      AROM   Right Hip Flexion  105    Right Hip External Rotation   42    Right Hip Internal Rotation   6    Right Hip ABduction  40    Right Hip ADduction  --   [facility]   Lumbar Flexion  90   able to touch toes   Lumbar Extension  27    Lumbar - Right Side Bend  28    Lumbar - Left Side Bend  30    Lumbar - Right Rotation  50%    Lumbar - Left Rotation  70%      Strength   Right Hip Flexion  5/5    Right Hip Extension  4+/5    Right Hip External Rotation   5/5    Right Hip Internal Rotation  5/5    Right Hip ABduction  4+/5                   [facility] Adult PT Treatment/Exercise - 01/09/19 0001      Lumbar Exercises: Stretches   Passive Hamstring Stretch  Right;Left;3 reps;30 seconds    Single Knee to Chest Stretch  Right;Left;3 reps;20 seconds      Lumbar Exercises: Aerobic   Nustep  L3 x 6 min LE only      Lumbar Exercises: Supine   Clam  20 reps    Clam Limitations  blue band    Bent Knee Raise  15 reps    Bridge with Ball Squeeze  20 reps      Manual Therapy   Joint Mobilization  Right hip long axis distraction grade I-IV             PT Education - 01/09/19 0909    Education Details  POC, HEP  Person(s) Educated  Patient    Methods  Explanation    Comprehension  Verbalized understanding       PT Short Term Goals - 01/09/19 0927      PT SHORT TERM GOAL #1   Title  Pt will be independent with HEP to address back pain,  balance, and gait.  TARGET 12/15/17    Baseline  Band exer every day  other exercises 1-2days per week.     Time  3    Period  Weeks    Status  Achieved      PT SHORT TERM GOAL #2   Title  Pt will improve improve TUG score to less than or equal to 15 seconds for decreased fall risk.    Baseline  TUG 19.03 seconds (Scores >13.5 seconds indicate increased fall risk.) during eval and 12.05 sec. on 12/14/17    Time  3    Period  Weeks    Status  Achieved        PT Long Term Goals - 01/09/19 4239      PT LONG TERM GOAL #1   Title  Independent with HEP    Baseline  met    Time  4    Period  Weeks    Status  Achieved      PT LONG TERM GOAL #2   Title  Tolerate sitting 30 min or greater for eating meals, car travel    Baseline  45 min generally    Time  4    Period  Weeks    Status  Achieved      PT LONG TERM GOAL #3   Title  Tolerate walking 35-40 min for grocery shopping, IADLs    Baseline  met    Time  4    Period  Weeks    Status  Achieved      PT LONG TERM GOAL #4   Title  Right hip strength 5/5 to improve ability for lifting for chores, IADLs    Baseline  see flowsheet    Time  4    Period  Weeks    Status  Partially Met      PT LONG TERM GOAL #5   Title  Increase trunk flexion AROM 5-10 deg to improve ability for bending for lifting for chores, donning shoes    Baseline  90 degrees flexion    Time  4    Period  Weeks    Status  Achieved            Plan - 01/09/19 0925    Clinical Impression Statement  P.t has attended 15 therapy session for chronic LBP issues. Though recent setback secondary to fall otherwise functional goals have been met (all goals met excepting LTG for hip strength partially met). Expect at this point pt. can continue progress via HEP, recommend follow up with MD with any changes in status.    Stability/Clinical Decision Making  Evolving/Moderate complexity    Clinical Decision Making  Moderate    Rehab Potential  Fair    Clinical  Impairments Affecting Rehab Potential  chronic pain history, past therapy, comorbidities    PT Frequency  2x / week    PT Duration  4 weeks    PT Treatment/Interventions  ADLs/Self Care Home Management;Cryotherapy;Electrical Stimulation;Ultrasound;Moist Heat;Traction;Neuromuscular re-education;Functional mobility training;Therapeutic activities;Therapeutic exercise;Patient/family education;Manual techniques;Dry needling;Taping    PT Next Visit Plan  NA    PT Home Exercise Plan  pelvic tilts, SKTC, hip.  add. isometrics, clamshell, hip bridges, hamstring and piriformis stretches, clam and reverse clam, ab set in standing , prone hip ext with pillows.   Row , extension snd cross chest pulls RT and LT green band    Consulted and Agree with Plan of Care  Patient       Patient will benefit from skilled therapeutic intervention in order to improve the following deficits and impairments:  Pain, Impaired sensation, Decreased activity tolerance, Decreased endurance, Decreased range of motion, Decreased strength, Obesity, Difficulty walking  Visit Diagnosis: Chronic bilateral low back pain without sciatica  Muscle weakness (generalized)     Problem List Patient Active Problem List   Diagnosis Date Noted  . Alteration consciousness 11/19/2018  . Neck pain 10/09/2018  . Left arm weakness 10/09/2018  . B12 deficiency 08/10/2017  . Persistent headaches 08/09/2017  . GERD (gastroesophageal reflux disease) 06/29/2017  . Left arm numbness   . Cerebral embolism with cerebral infarction 06/17/2017  . TIA (transient ischemic attack) 06/17/2017  . Chronic back pain 12/29/2016  . Depression 12/15/2016  . Vitamin D deficiency 12/09/2016  . Right hip pain 12/07/2016  . Hypokalemia 12/07/2016  . Type 2 diabetes mellitus with vascular disease ([place]) 05/31/2016  . Normocytic normochromic anemia 05/31/2016  . Chest pain 05/31/2016  . CAD (coronary artery disease) 01/06/2016  . DVT (deep venous thrombosis)  ([place]) 01/06/2016  . Lactic acidosis 05/28/2014  . Nonspecific chest pain 01/29/2014  . Uncontrolled secondary diabetes with peripheral neuropathy ([place]) 01/29/2014  . Obesity, Class III, BMI 40-49.9 (morbid obesity) ([place]) 01/29/2014  . Snoring 01/29/2014  . Dyslipidemia 01/29/2014  . HTN (hypertension) 01/29/2014  . Abnormal nuclear stress test 01/29/2014    Beaulah Dinning, PT, DPT 01/09/19 9:29 AM  [facility] [address] [place], [place], [address] Phone: [phone]   Fax:  [phone]  Name: [name] MRN: [id number] Date of Birth: 11-17-1960

## 2019-01-22 ENCOUNTER — Telehealth: Payer: Self-pay | Admitting: *Deleted

## 2019-01-22 NOTE — Telephone Encounter (Signed)
Left message for pt to call to discuss 02-04-2019 appointment. 

## 2019-01-22 NOTE — Telephone Encounter (Signed)
Follow up: ° ° °Patient returning call back concerning appt. °

## 2019-01-22 NOTE — Telephone Encounter (Signed)
Spoke with pt, he would like to keep his appointment, he has concerns about some chest pain that he is having occasionally. [phone] questions and recommendations regarding appointment discussed with the patient.

## 2019-01-25 DIAGNOSIS — Z794 Long term (current) use of insulin: Secondary | ICD-10-CM | POA: Insufficient documentation

## 2019-01-31 ENCOUNTER — Telehealth: Payer: Self-pay

## 2019-01-31 NOTE — Telephone Encounter (Signed)
Left detailed message for the patient about switching his upcoming appointment scheduled for 4/13 at 64 with Dr. Stanford Breed to a virtual visit and that he could give our office a call back and someone would be able to assist with the matter

## 2019-02-01 ENCOUNTER — Telehealth: Payer: Self-pay

## 2019-02-01 NOTE — Telephone Encounter (Signed)
Created in error

## 2019-02-01 NOTE — Telephone Encounter (Signed)
Called and left a message telling patient to disregard previous message left for him and that this is a reminder call of his appointment for Monday 02/04/2019 at 12:00 PM with Dr. Stanford Breed in office and to call our office to let us know that he received the message and will be in office on 02/04/19 at 12:00 PM

## 2019-02-01 NOTE — Telephone Encounter (Signed)
Called the patient's case worker Craig Guess who is on the patient's DPR and informed her that the office has been trying to get in contact with Mr. Werts and that I was calling her to inform her of his upcoming in office visit with Dr. Stanford Breed on Monday 4/13 at 12:00 PM. She stated that she will get in touch with Mr. Miranda and that he is pretty good about going to his appointments.

## 2019-02-04 ENCOUNTER — Ambulatory Visit (INDEPENDENT_AMBULATORY_CARE_PROVIDER_SITE_OTHER): Payer: Medicaid Other | Admitting: Cardiology

## 2019-02-04 DIAGNOSIS — I2510 Atherosclerotic heart disease of native coronary artery without angina pectoris: Secondary | ICD-10-CM

## 2019-02-04 NOTE — Progress Notes (Signed)
Not seen Kirk Ruths

## 2019-02-05 ENCOUNTER — Telehealth: Payer: Self-pay | Admitting: Cardiology

## 2019-02-05 NOTE — Telephone Encounter (Signed)
Spoke with pt, Follow up scheduled with the DOD.

## 2019-02-05 NOTE — Telephone Encounter (Signed)
New message  Patient would like a call in reference to rescheduling appt that was scheduled on 02/04/2019. The patient states that he should be seen in the office. Please call to discuss.

## 2019-02-06 ENCOUNTER — Ambulatory Visit (INDEPENDENT_AMBULATORY_CARE_PROVIDER_SITE_OTHER): Payer: Medicaid Other | Admitting: Cardiovascular Disease

## 2019-02-06 ENCOUNTER — Other Ambulatory Visit: Payer: Self-pay

## 2019-02-06 ENCOUNTER — Encounter: Payer: Self-pay | Admitting: Cardiovascular Disease

## 2019-02-06 VITALS — BP 122/62 | HR 88 | Ht 70.0 in | Wt 300.4 lb

## 2019-02-06 DIAGNOSIS — I679 Cerebrovascular disease, unspecified: Secondary | ICD-10-CM

## 2019-02-06 DIAGNOSIS — I5033 Acute on chronic diastolic (congestive) heart failure: Secondary | ICD-10-CM | POA: Diagnosis not present

## 2019-02-06 DIAGNOSIS — R079 Chest pain, unspecified: Secondary | ICD-10-CM | POA: Diagnosis not present

## 2019-02-06 DIAGNOSIS — I10 Essential (primary) hypertension: Secondary | ICD-10-CM

## 2019-02-06 DIAGNOSIS — I82401 Acute embolism and thrombosis of unspecified deep veins of right lower extremity: Secondary | ICD-10-CM

## 2019-02-06 DIAGNOSIS — I2510 Atherosclerotic heart disease of native coronary artery without angina pectoris: Secondary | ICD-10-CM | POA: Diagnosis not present

## 2019-02-06 MED ORDER — ISOSORBIDE MONONITRATE ER 60 MG PO TB24: 60.0000 mg | ORAL_TABLET | Freq: Every day | ORAL | 1 refills | Status: DC

## 2019-02-06 MED ORDER — NITROGLYCERIN 0.4 MG SL SUBL: 0.4000 mg | SUBLINGUAL_TABLET | SUBLINGUAL | 1 refills | Status: AC | PRN

## 2019-02-06 NOTE — Progress Notes (Signed)
Cardiology Office Note   Date:  02/06/2019   ID:  BREYLIN DOM, DOB 07/05/1961, MRN [id number]  PCP:  [name], FNP  Cardiologist:   Kirk Ruths, MD  No chief complaint on file.    History of Present Illness: [name] is a 58 y.o. male with chronic diastolic heart failure, CAD status post LAD PCI (05/2016), mild carotid stenosis, hypertension, hyperlipidemia, diabetes DVT, morbid obesity, OSA, and ascending aortic aneurysm who presents for follow up.  [name] reports 5 or 6 months of intermittent chest tightness.  The episodes come and go randomly.  He was previously going to the gym regularly and felt better in the gym.  Lately he notes it when sitting at rest or when stretching his arms.  It lasts for approximately 5 minutes and there is no associated shortness of breath.  He has nausea constantly and it is not worse during these episodes.  He takes a nitroglycerin and this seems to help.  He has persistent lower extremity edema that is at baseline.  He wears compression socks with seems to help.  He does report orthopnea but no PND.  He drinks 2.5 gallons of water daily in addition to other beverages. His weight has been increasing lately, but he attributes this to not being as active in the setting of COVID-19.  [name] underwent cardiac catheterization 11/2017 that showed a patent stent and normal systolic function.  LVEDP was 27 mmHg.  He previously had a DVT/PE and recalls being on Xarelto followed by another medication for at least 8 months.  He has been off all anticoagulation for at least a year.  He denies any asymmetric edema or recent travel.  He last saw Dr. Stanford Breed 06/2018 at which time he reported some exertional dyspnea but no chest pain.  Hydralazine was increased due to poorly controlled blood pressure.  Past Medical History:  Diagnosis Date  . Anemia   . Arthritis   . Back pain   . CAD (coronary artery disease)    a. s/p DES to LAD in  05/2016  . Cervical radiculopathy   . Chronic diastolic CHF (congestive heart failure) ([place])   . Chronic pain   . Depression   . DVT (deep venous thrombosis) ([place])   . Hematemesis   . Hepatic steatosis   . Hyperlipidemia   . Hypertension   . IBS (irritable bowel syndrome)   . Morbid obesity ([place])   . OSA (obstructive sleep apnea)   . Pancreatitis   . PE (pulmonary thromboembolism) ([place])   . Peripheral neuropathy   . PUD (peptic ulcer disease)   . Renal disorder   . Stroke [facility])    a. ?details unclear - not seen on imaging when he was admitted in 05/2017 for TIA symptoms which were felt due to cervical radiculopathy.  . Thoracic aortic ectasia (HCC)    a. 4.3cm ectatic ascending thoracic aorta by CT 06/2017.   . Type 2 diabetes mellitus ([place])     Past Surgical History:  Procedure Laterality Date  . CARDIAC CATHETERIZATION N/A 05/31/2016   Procedure: Left Heart Cath and Coronary Angiography;  Surgeon: Peter M Martinique, MD;  Location: [place] [facility];  Service: Cardiovascular;  Laterality: N/A;  . CARDIAC CATHETERIZATION N/A 05/31/2016   Procedure: Intravascular Pressure Wire/FFR Study;  Surgeon: Peter M Martinique, MD;  Location: [place] [facility];  Service: Cardiovascular;  Laterality: N/A;  . CARDIAC CATHETERIZATION N/A 05/31/2016   Procedure:  Coronary Stent Intervention;  Surgeon: Peter M Martinique, MD;  Location: [place] [facility];  Service: Cardiovascular;  Laterality: N/A;  . LEFT HEART CATH AND CORONARY ANGIOGRAPHY N/A 12/08/2017   Procedure: LEFT HEART CATH AND CORONARY ANGIOGRAPHY;  Surgeon: Leonie Man, MD;  Location: [place] [facility];  Service: Cardiovascular;  Laterality: N/A;  . LEFT HEART CATHETERIZATION WITH CORONARY ANGIOGRAM N/A 02/03/2014   Procedure: LEFT HEART CATHETERIZATION WITH CORONARY ANGIOGRAM;  Surgeon: Pixie Casino, MD;  Location: [facility] CATH LAB;  Service: Cardiovascular;  Laterality: N/A;  . left leg stent        Current Outpatient Medications   Medication Sig Dispense Refill  . Albuterol Sulfate (PROAIR RESPICLICK) 322 (90 Base) MCG/ACT AEPB Inhale 2 puffs into the lungs every 6 (six) hours as needed. 1 each 0  . aspirin 81 MG chewable tablet Chew 1 tablet (81 mg total) by mouth daily. 30 tablet 0  . atorvastatin (LIPITOR) 80 MG tablet Take 1 tablet (80 mg total) by mouth daily. 90 tablet 3  . carvedilol (COREG) 25 MG tablet Take 1 tablet (25 mg total) by mouth 2 (two) times daily with a meal. 180 tablet 3  . colchicine-probenecid 0.5-500 MG tablet Take 1 tablet by mouth 2 (two) times daily.    . diclofenac (FLECTOR) 1.3 % PTCH Place 1 patch onto the skin daily as needed.    . diclofenac sodium (VOLTAREN) 1 % GEL Apply 1 application topically 4 (four) times daily. 100 g 0  . DULoxetine (CYMBALTA) 60 MG capsule TAKE 1 CAPSULE BY MOUTH EVERY DAY 90 capsule 3  . famotidine (PEPCID) 40 MG tablet Take 1 tablet (40 mg total) by mouth at bedtime. 30 tablet 2  . FARXIGA 10 MG TABS tablet Take 10 mg by mouth daily.    . furosemide (LASIX) 40 MG tablet Take 40 mg by mouth 3 (three) times daily.    Marland Kitchen glipiZIDE (GLUCOTROL) 10 MG tablet Take 10 mg by mouth 2 (two) times daily.  4  . hydrALAZINE (APRESOLINE) 25 MG tablet Take 3 tablets (75 mg total) by mouth 3 (three) times daily. 540 tablet 3  . Insulin Detemir (LEVEMIR FLEXTOUCH) 100 UNIT/ML Pen Inject 55 Units into the skin daily.    . Insulin Pen Needle 31G X 5 MM MISC Use 1 needle daily to inject insulin as prescribed 100 each 2  . isosorbide mononitrate (IMDUR) 60 MG 24 hr tablet Take 1 tablet (60 mg total) by mouth daily. 90 tablet 1  . lisinopril (PRINIVIL,ZESTRIL) 10 MG tablet Take 1 tablet (10 mg total) by mouth daily. 30 tablet 0  . LYRICA 100 MG capsule Take 1 capsule (100 mg total) by mouth 2 (two) times daily. 60 capsule 4  . metFORMIN (GLUCOPHAGE-XR) 500 MG 24 hr tablet Take 2 tablets (1,000 mg total) by mouth 2 (two) times daily.    . nitroGLYCERIN (NITROSTAT) 0.4 MG SL tablet Place  1 tablet (0.4 mg total) under the tongue every 5 (five) minutes as needed for chest pain. 100 tablet 1  . oxyCODONE (ROXICODONE) 15 MG immediate release tablet Take 15 mg 3 (three) times daily as needed by mouth for pain.   0  . pantoprazole (PROTONIX) 40 MG tablet Take 1 tablet (40 mg total) by mouth 2 (two) times daily. 60 tablet 1  . potassium chloride SA (K-DUR,KLOR-CON) 20 MEQ tablet Take 20-40 mEq by mouth daily. 40 meq in the lunch, 20 med at dinner and 20 meq at bedtime    .  sertraline (ZOLOFT) 50 MG tablet Take 50 mg by mouth daily.    Marland Kitchen tiZANidine (ZANAFLEX) 2 MG tablet Take 2 mg by mouth 2 (two) times daily as needed for pain.    . traZODone (DESYREL) 50 MG tablet Take 1 tablet (50 mg total) by mouth at bedtime. 30 tablet 11   No current facility-administered medications for this visit.     Allergies:   Coconut flavor [flavoring agent]; Coconut oil; Ibuprofen; Aleve [naproxen]; and Nsaids    Social History:  The patient  reports that he has never smoked. He has never used smokeless tobacco. He reports that he does not drink alcohol or use drugs.   Family History:  The patient's family history includes Breast cancer in his mother; Cancer in his father; Diabetes in his brother, mother, and sister; Hypertension in his brother, mother, and sister.    ROS:  Please see the history of present illness.   Otherwise, review of systems are positive for none.   All other systems are reviewed and negative.    PHYSICAL EXAM: VS:  BP 122/62   Pulse 88   Ht 5\' 10"  (1.778 m)   Wt (!) 300 lb 6.4 oz (136.3 kg)   BMI 43.10 kg/m  , BMI Body mass index is 43.1 kg/m. GENERAL:  Well appearing HEENT:  Pupils equal round and reactive, fundi not visualized, oral mucosa unremarkable NECK:  No jugular venous distention, waveform within normal limits, carotid upstroke brisk and symmetric, no bruits, no thyromegaly LYMPHATICS:  No cervical adenopathy LUNGS:  Clear to auscultation bilaterally HEART:   RRR.  PMI not displaced or sustained,S1 and S2 within normal limits, no S3, no S4, no clicks, no rubs, II/VI systolic murmur at the LUSB ABD:  Flat, positive bowel sounds normal in frequency in pitch, no bruits, no rebound, no guarding, no midline pulsatile mass, no hepatomegaly, no splenomegaly EXT:  2 plus pulses throughout, no edema, no cyanosis no clubbing SKIN:  No rashes no nodules NEURO:  Cranial nerves II through XII grossly intact, motor grossly intact throughout PSYCH:  Cognitively intact, oriented to person place and time   EKG:  EKG is ordered today. The ekg ordered today demonstrates sinus rhythm.  Rate 88bpm.     Recent Labs: 06/10/2018: ALT 33 11/07/2018: BUN 9; Creatinine, Ser 0.85; Hemoglobin 10.2; Platelets 250; Potassium 3.4; Sodium 141    Lipid Panel    Component Value Date/Time   CHOL 88 12/08/2017 0212   TRIG 217 (H) 12/08/2017 0212   HDL 29 (L) 12/08/2017 0212   CHOLHDL 3.0 12/08/2017 0212   VLDL 43 (H) 12/08/2017 0212   LDLCALC 16 12/08/2017 0212      Wt Readings from Last 3 Encounters:  02/06/19 (!) 300 lb 6.4 oz (136.3 kg)  12/30/18 295 lb (133.8 kg)  11/19/18 (!) 303 lb (137.4 kg)      ASSESSMENT AND PLAN:  # Atypical chest pain: # Acute on chronic diastolic heart failure: Mr. Fontenette has atypical chest pain that seems to be more attributable to volume overload than ischemia. He is drinking >2.5 gallons of water daily and has chronic LE edema.  Neck veins are difficult to assess 2/2 body habitus.  Symptoms have been ongoing for months and are unchanged from when he underwent [facility] 2/20219.  He is going to work on limiting fluid intake to 2L.  Continue lasix 40mg  tid. Given that his symptoms improve with sublingual nitroglycerin and elevated LVEDP in the past, we will increase Imdur to  60mg , which will also help afterload reduction.  His symptoms are not pleuritic, associated with SOB or asymmetric edema, making recurrent DVT/PE less likely.  #  Hypertension:  BP well-controlled.  Continue carvedilol, lisinopril, hydralazine, and increase Imdur as above.   # CAD: s/p LAD PCI.  Symptoms not consistent with ischemia as above.  Continue aspirin, atorvastatin and carvedilol.  Current medicines are reviewed at length with the patient today.  The patient does not have concerns regarding medicines.  The following changes have been made:  Increase Imdur  Labs/ tests ordered today include:   Orders Placed This Encounter  Procedures  . EKG 12-Lead     Disposition:   FU with Dr. Stanford Breed in 2-4 weeks.    Signed, Christos Mixson C. Oval Linsey, MD, [facility]  02/06/2019 1:02 PM    [facility]

## 2019-02-06 NOTE — Patient Instructions (Signed)
Medication Instructions:  INCREASE YOUR ISOSORBIDE TO 60 MG DAILY   If you need a refill on your cardiac medications before your next appointment, please call your pharmacy.   Lab work: NONE  Testing/Procedures: NONE   Follow-Up: Your physician recommends that you schedule a follow-up appointment in: 2-4 WEEKS VIDEO VISIT WITH DR Stanford Breed   Any Other Special Instructions Will Be Listed Below (If Applicable).  LIMIT YOUR FLUIDS TO 2 AND 1/2 LITERS A DAY   Fluid Restriction With some health conditions, you must restrict your fluid intake. This means that you need to limit the amount of fluid that you drink each day (fluid restriction). When you have a fluid restriction, you must carefully measure and keep track of the amount of fluid that you drink. Your health care provider will identify the specific amount of fluid you are allowed each day (fluid allowance). This amount may depend on several things, such as:  How well your kidneys function.  How much fluid you are keeping (retaining) in your body tissues.  Your blood pressure.  Your heart function.  Your blood sodium level. What is my plan? Your health care provider recommends that you limit your fluid intake to _2 AND 1/2 LITERS _ per day. What counts toward my fluid intake? Your fluid intake includes all liquids that you drink, as well as any foods that become liquid at room temperature. The following are examples of some fluids that you will have to restrict:  Tea, coffee, soda, lemonade, milk, water, juice, sports drinks, and nutritional supplement beverages.  Alcoholic beverages.  Cream.  Gravy.  Ice cubes.  Soup and broth. The following are examples of foods that become liquid at room temperature. These foods will also count toward your fluid intake.  Ice cream and ice milk.  Frozen yogurt and sherbet.  Frozen ice pops.  Flavored gelatin. How do I keep track of my fluid intake? Each morning, fill a jug  with the amount of water that is equal to your daily fluid allowance. You can use this water as a guideline for fluid allowance. Each time you take in any form of fluid (including ice cubes and foods that become liquid at room temperature), pour an equal amount of water out of the container. This helps you to see how much fluid you are taking in. It also helps you to see how much more fluid you can take in during the rest of the day. The following conversions may also be helpful in measuring your fluid intake:  1 cup equals 8 oz (240 mL).   cup equals 6 oz (180 mL).  ? cup equals 5? oz (160 mL).   cup equals 4 oz (120 mL).  ? cup equals 2? oz (80 mL).   cup equals 2 oz (60 mL).  2 Tbsp equals 1 oz (30 mL). What are tips for following this plan? General instructions  Make sure that you stay within your recommended fluid allowance each day. Always measure and keep track of your fluids (including ice cubes and foods that become liquid at room temperature).  Use small cups and glasses and learn to sip fluids slowly.  Try frozen fruits between meals, such as grapes or strawberries. These can satisfy thirst without adding to your fluid intake.  Swallow your pills along with meals or soft foods such as applesauce or mashed potatoes, instead of with liquids. Doing this helps you to save your fluid allowance for something that you enjoy. Weigh yourself each  day     Weigh yourself every day. Keeping track of your daily weight can help you and your health care provider to notice as soon as possible if you are retaining too much fluid in your body.  Follow this sequence every morning: 1. Urinate. 2. Weigh yourself. 3. Eat breakfast.  Wear the same amount of clothing each time you weigh yourself.  Write down your daily weight. Give this weight record to your health care provider. If your weight is going up, you may be retaining too much fluid. Every 1 lb (0.45 kg) of body weight that you  gain is a sign that your body is retaining 2 cups (480 mL) of fluid.  Manage your thirst  Add lemon juice or a slice of fresh lemon to water or ice. Doing this helps to satisfy your thirst.  Freeze fruit juice or water in an ice cube tray. Use this as part of your fluid allowance. These cubes are useful for quenching your thirst. Before you freeze the juice or water, measure how much liquid you use to fill a cube section of the ice tray. Subtract this amount from your day's allowance each time you consume a frozen cube.  Avoid salty (high-sodium) foods. These foods make you thirsty and make it more difficult to stay within your daily fluid allowance.  Keep the temperature in your home at a cooler level.  Keep the air in your home as humid as possible. Dry air increases thirst.  Avoid being out in the hot sun, which can cause you to sweat and become thirsty.  To help avoid dry mouth, brush your teeth often or rinse out your mouth with mouthwash. Lemon wedges, hard sour candies, chewing gum, or breath spray may also help to moisten your mouth. What are some signs that I may be taking in too much fluid? You may be taking in too much fluid if:  Your weight increases. Contact your health care provider if you gain weight rapidly.  Your face, hands, legs, feet, and abdomen start to swell.  You have trouble breathing. Summary  With some health conditions, you must limit (restrict) your fluid intake. This means that you need to limit the amount of fluid you drink each day (fluid restriction). Your health care provider will identify the specific amount of fluid that you are allowed each day.  When you have a fluid restriction, you must carefully measure and keep track of the amount of fluid that you drink.  Your fluid intake includes all liquids that you drink, as well as any foods that become liquid at room temperature (such as ice cream and gelatin).  You may be taking in too much fluid if  your weight increases, your body starts to swell, or you have trouble breathing. This information is not intended to replace advice given to you by your health care provider. Make sure you discuss any questions you have with your health care provider. Document Released: 08/07/2007 Document Revised: 06/14/2017 Document Reviewed: 06/14/2017 Elsevier Interactive Patient Education  2019 [employer].

## 2019-02-11 DIAGNOSIS — M7060 Trochanteric bursitis, unspecified hip: Secondary | ICD-10-CM | POA: Insufficient documentation

## 2019-02-25 ENCOUNTER — Telehealth: Payer: Self-pay | Admitting: Cardiology

## 2019-02-25 NOTE — Progress Notes (Signed)
Virtual Visit via Video Note changed to telephone visit at patient request due to technical difficulties   This visit type was conducted due to national recommendations for restrictions regarding the COVID-19 Pandemic (e.g. social distancing) in an effort to limit this patient's exposure and mitigate transmission in our community.  Due to his co-morbid illnesses, this patient is at least at moderate risk for complications without adequate follow up.  This format is felt to be most appropriate for this patient at this time.  All issues noted in this document were discussed and addressed.  A limited physical exam was performed with this format.  Please refer to the patient's chart for his consent to telehealth for [facility].   Date:  02/26/2019   ID:  [name], DOB September 01, 1961, MRN [id number]  Patient Location: Home Provider Location: Home  PCP:  [name], FNP  Cardiologist:  Dr Stanford Breed  Evaluation Performed:  Follow-Up Visit  Chief Complaint:  FU CAD  History of Present Illness:    FU CADand diastolic CHF.Pt had DES to LAD 8/17.CTA 9/17 showed no pulmonary embolus.Last echocardiogram September 2018 showed normal LV function and grade 2 diastolic dysfunction. Cardiac catheterization February 2019 showed patent stents and normal LV function. Left ventricular end-diastolic pressure elevated at 27 mmHg.   Carotid Dopplers April 2019 showed 1 to 39% right and near normal left carotid.  CTA August 2019 showed no thoracic aortic aneurysm.  Since last seenpatient continues to have occasional chest pain.  It is in the left chest area described as a tightness lasting several minutes and resolves spontaneously.  It improves with exercise.  It has been chronic.  He has mild dyspnea on exertion but no orthopnea or PND.  He did have a syncopal episode while driving his car back in January by his report.  No preceding palpitations, chest pain or dyspnea.  The patient does not have  symptoms concerning for COVID-19 infection (fever, chills, cough, or new shortness of breath).    Past Medical History:  Diagnosis Date  . Anemia   . Arthritis   . Back pain   . CAD (coronary artery disease)    a. s/p DES to LAD in 05/2016  . Cervical radiculopathy   . Chronic diastolic CHF (congestive heart failure) ([place])   . Chronic pain   . Depression   . DVT (deep venous thrombosis) ([place])   . Hematemesis   . Hepatic steatosis   . Hyperlipidemia   . Hypertension   . IBS (irritable bowel syndrome)   . Morbid obesity ([place])   . OSA (obstructive sleep apnea)   . Pancreatitis   . PE (pulmonary thromboembolism) ([place])   . Peripheral neuropathy   . PUD (peptic ulcer disease)   . Renal disorder   . Stroke [facility])    a. ?details unclear - not seen on imaging when he was admitted in 05/2017 for TIA symptoms which were felt due to cervical radiculopathy.  . Thoracic aortic ectasia ([facility])    a. 4.3cm ectatic ascending thoracic aorta by CT 06/2017.   . Type 2 diabetes mellitus ([place])    Past Surgical History:  Procedure Laterality Date  . CARDIAC CATHETERIZATION N/A 05/31/2016   Procedure: Left Heart Cath and Coronary Angiography;  Surgeon: Peter M Martinique, MD;  Location: [place] CV LAB;  Service: Cardiovascular;  Laterality: N/A;  . CARDIAC CATHETERIZATION N/A 05/31/2016   Procedure: Intravascular Pressure Wire/FFR Study;  Surgeon: Peter M Martinique, MD;  Location: [place]  CV LAB;  Service: Cardiovascular;  Laterality: N/A;  . CARDIAC CATHETERIZATION N/A 05/31/2016   Procedure: Coronary Stent Intervention;  Surgeon: Peter M Martinique, MD;  Location: [place] CV LAB;  Service: Cardiovascular;  Laterality: N/A;  . LEFT HEART CATH AND CORONARY ANGIOGRAPHY N/A 12/08/2017   Procedure: LEFT HEART CATH AND CORONARY ANGIOGRAPHY;  Surgeon: Leonie Man, MD;  Location: [place] CV LAB;  Service: Cardiovascular;  Laterality: N/A;  . LEFT HEART CATHETERIZATION WITH CORONARY ANGIOGRAM N/A 02/03/2014    Procedure: LEFT HEART CATHETERIZATION WITH CORONARY ANGIOGRAM;  Surgeon: Pixie Casino, MD;  Location: [facility] CATH LAB;  Service: Cardiovascular;  Laterality: N/A;  . left leg stent        Current Meds  Medication Sig  . Albuterol Sulfate (PROAIR RESPICLICK) 601 (90 Base) MCG/ACT AEPB Inhale 2 puffs into the lungs every 6 (six) hours as needed.  Marland Kitchen aspirin 81 MG chewable tablet Chew 1 tablet (81 mg total) by mouth daily.  Marland Kitchen atorvastatin (LIPITOR) 80 MG tablet Take 1 tablet (80 mg total) by mouth daily.  . carvedilol (COREG) 25 MG tablet Take 1 tablet (25 mg total) by mouth 2 (two) times daily with a meal.  . colchicine-probenecid 0.5-500 MG tablet Take 1 tablet by mouth 2 (two) times daily.  . diclofenac (FLECTOR) 1.3 % PTCH Place 1 patch onto the skin daily as needed.  . diclofenac sodium (VOLTAREN) 1 % GEL Apply 1 application topically 4 (four) times daily.  . DULoxetine (CYMBALTA) 60 MG capsule TAKE 1 CAPSULE BY MOUTH EVERY DAY  . famotidine (PEPCID) 40 MG tablet Take 1 tablet (40 mg total) by mouth at bedtime.  Marland Kitchen FARXIGA 10 MG TABS tablet Take 10 mg by mouth daily.  . furosemide (LASIX) 40 MG tablet Take 40 mg by mouth 3 (three) times daily.  Marland Kitchen glipiZIDE (GLUCOTROL) 10 MG tablet Take 10 mg by mouth 2 (two) times daily.  . Insulin Detemir (LEVEMIR FLEXTOUCH) 100 UNIT/ML Pen Inject 55 Units into the skin daily.  . Insulin Pen Needle 31G X 5 MM MISC Use 1 needle daily to inject insulin as prescribed  . isosorbide mononitrate (IMDUR) 60 MG 24 hr tablet Take 1 tablet (60 mg total) by mouth daily.  Marland Kitchen lisinopril (PRINIVIL,ZESTRIL) 10 MG tablet Take 1 tablet (10 mg total) by mouth daily.  . metFORMIN (GLUCOPHAGE-XR) 500 MG 24 hr tablet Take 2 tablets (1,000 mg total) by mouth 2 (two) times daily.  . nitroGLYCERIN (NITROSTAT) 0.4 MG SL tablet Place 1 tablet (0.4 mg total) under the tongue every 5 (five) minutes as needed for chest pain.  Marland Kitchen oxyCODONE (ROXICODONE) 15 MG immediate release tablet  Take 15 mg 3 (three) times daily as needed by mouth for pain.   . potassium chloride SA (K-DUR,KLOR-CON) 20 MEQ tablet Take 20-40 mEq by mouth daily. 40 meq in the lunch, 20 med at dinner and 20 meq at bedtime  . sertraline (ZOLOFT) 50 MG tablet Take 50 mg by mouth daily.  Marland Kitchen tiZANidine (ZANAFLEX) 2 MG tablet Take 2 mg by mouth 2 (two) times daily as needed for pain.  . traZODone (DESYREL) 50 MG tablet Take 1 tablet (50 mg total) by mouth at bedtime.  . [DISCONTINUED] LYRICA 100 MG capsule Take 1 capsule (100 mg total) by mouth 2 (two) times daily.  . [DISCONTINUED] pantoprazole (PROTONIX) 40 MG tablet Take 1 tablet (40 mg total) by mouth 2 (two) times daily.     Allergies:   Coconut flavor [flavoring agent]; Coconut  oil; Ibuprofen; Aleve [naproxen]; and Nsaids   Social History   Tobacco Use  . Smoking status: Never Smoker  . Smokeless tobacco: Never Used  Substance Use Topics  . Alcohol use: No  . Drug use: No     Family Hx: The patient's family history includes Breast cancer in his mother; Cancer in his father; Diabetes in his brother, mother, and sister; Hypertension in his brother, mother, and sister.  ROS:   Please see the history of present illness.    No fevers, chills or productive cough. All other systems reviewed and are negative.  Recent Labs: 06/10/2018: ALT 33 11/07/2018: BUN 9; Creatinine, Ser 0.85; Hemoglobin 10.2; Platelets 250; Potassium 3.4; Sodium 141   Recent Lipid Panel Lab Results  Component Value Date/Time   CHOL 88 12/08/2017 02:12 AM   TRIG 217 (H) 12/08/2017 02:12 AM   HDL 29 (L) 12/08/2017 02:12 AM   CHOLHDL 3.0 12/08/2017 02:12 AM   LDLCALC 16 12/08/2017 02:12 AM    Wt Readings from Last 3 Encounters:  02/26/19 (!) 301 lb (136.5 kg)  02/06/19 (!) 300 lb 6.4 oz (136.3 kg)  12/30/18 295 lb (133.8 kg)    February 06, 2019-sinus rhythm, nonspecific ST changes.  Personally reviewed.  Objective:    Vital Signs:  BP 123/61   Pulse 91   Ht 5\' 10"   (1.778 m)   Wt (!) 301 lb (136.5 kg)   BMI 43.19 kg/m    VITAL SIGNS:  reviewed  No acute distress Normal affect Answers questions appropriately Remainder of physical examination not performed (telehealth visit; coronavirus pandemic)  ASSESSMENT & PLAN:    1. Coronary artery disease-continue aspirin and statin. 2. Chest pain-symptoms are chronic.  Most recent catheterization did not reveal obstructive coronary disease. We will not pursue further ischemia evaluation at this time. 3. Chronic diastolic congestive heart failure-patient apparently euvolemic based on history.  Plan to continue present dose of Lasix, fluid restriction and low-sodium diet. 4. Hypertension-patient's blood pressure is controlled.  Continue present medications and follow. 5. Morbid obesity-we discussed the importance of diet, exercise and weight loss. 6. Carotid artery disease-mild on most recent Dopplers. 7. Dyspnea-this is well to be multifactorial including sleep apnea, obesity hypoventilation syndrome, chronic diastolic congestive heart failure. 8. Syncope-patient describes a syncopal episode in January.  He has had no recurrences since then.  His LV function has been normal in the past but we will plan to repeat echocardiogram.  If he has more frequent episodes in the future we can consider an event monitor versus implantable loop monitor.  COVID-19 Education: The importance of social distancing was discussed today.  Time:   Today, I have spent 15 minutes with the patient with telehealth technology discussing the above problems.     Medication Adjustments/Labs and Tests Ordered: Current medicines are reviewed at length with the patient today.  Concerns regarding medicines are outlined above.   Tests Ordered: No orders of the defined types were placed in this encounter.   Medication Changes: No orders of the defined types were placed in this encounter.   Disposition:  Follow up in 6 month(s)   Signed, Kirk Ruths, MD  02/26/2019 8:30 AM    [facility]

## 2019-02-26 ENCOUNTER — Telehealth (INDEPENDENT_AMBULATORY_CARE_PROVIDER_SITE_OTHER): Payer: Medicaid Other | Admitting: Cardiology

## 2019-02-26 VITALS — BP 123/61 | HR 91 | Ht 70.0 in | Wt 301.0 lb

## 2019-02-26 DIAGNOSIS — I10 Essential (primary) hypertension: Secondary | ICD-10-CM

## 2019-02-26 DIAGNOSIS — R55 Syncope and collapse: Secondary | ICD-10-CM

## 2019-02-26 DIAGNOSIS — R079 Chest pain, unspecified: Secondary | ICD-10-CM | POA: Diagnosis not present

## 2019-02-26 DIAGNOSIS — I2510 Atherosclerotic heart disease of native coronary artery without angina pectoris: Secondary | ICD-10-CM

## 2019-02-26 DIAGNOSIS — I5032 Chronic diastolic (congestive) heart failure: Secondary | ICD-10-CM

## 2019-02-26 NOTE — Patient Instructions (Signed)
Medication Instructions:  NO CHANGE If you need a refill on your cardiac medications before your next appointment, please call your pharmacy.   Lab work: Your physician recommends that you return for lab work in: [place] If you have labs (blood work) drawn today and your tests are completely normal, you will receive your results only by: Marland Kitchen MyChart Message (if you have MyChart) OR . A paper copy in the mail If you have any lab test that is abnormal or we need to change your treatment, we will call you to review the results.  Testing/Procedures: Your physician has requested that you have an echocardiogram. Echocardiography is a painless test that uses sound waves to create images of your heart. It provides your doctor with information about the size and shape of your heart and how well your heart's chambers and valves are working. This procedure takes approximately one hour. There are no restrictions for this procedure.  [place]  Follow-Up: At [facility], you and your health needs are our priority.  As part of our continuing mission to provide you with exceptional heart care, we have created designated Provider Care Teams.  These Care Teams include your primary Cardiologist (physician) and Advanced Practice Providers (APPs -  Physician Assistants and Nurse Practitioners) who all work together to provide you with the care you need, when you need it. You will need a follow up appointment in 6 months.  Please call our office 2 months in advance to schedule this appointment.  You may see Kirk Ruths MD or one of the following Advanced Practice Providers on your designated Care Team:   Kerin Ransom, PA-C Roby Lofts, [place] . Sande Rives, PA-C

## 2019-03-08 ENCOUNTER — Other Ambulatory Visit: Payer: Self-pay | Admitting: Cardiology

## 2019-03-09 LAB — COMPLETE METABOLIC PANEL WITH GFR
AG Ratio: 1.6 (calc) (ref 1.0–2.5)
ALT: 15 U/L (ref 9–46)
AST: 14 U/L (ref 10–35)
Albumin: 4.2 g/dL (ref 3.6–5.1)
Alkaline phosphatase (APISO): 39 U/L (ref 35–144)
BUN: 15 mg/dL (ref 7–25)
CO2: 27 mmol/L (ref 20–32)
Calcium: 9.8 mg/dL (ref 8.6–10.3)
Chloride: 99 mmol/L (ref 98–110)
Creat: 0.74 mg/dL (ref 0.70–1.33)
GFR, Est African American: 120 mL/min/{1.73_m2} (ref >=60)
GFR, Est Non African American: 104 mL/min/{1.73_m2} (ref >=60)
Globulin: 2.6 g/dL (calc) (ref 1.9–3.7)
Glucose, Bld: 180 mg/dL — ABNORMAL HIGH (ref 65–99)
Potassium: 4 mmol/L (ref 3.5–5.3)
Sodium: 139 mmol/L (ref 135–146)
Total Bilirubin: 0.5 mg/dL (ref 0.2–1.2)
Total Protein: 6.8 g/dL (ref 6.1–8.1)

## 2019-03-09 LAB — LIPID PANEL
Cholesterol: 152 mg/dL (ref <200)
HDL: 35 mg/dL — ABNORMAL LOW (ref >=40)
Non-HDL Cholesterol (Calc): 117 mg/dL (calc) (ref <130)
Total CHOL/HDL Ratio: 4.3 (calc) (ref <5.0)
Triglycerides: 599 mg/dL — ABNORMAL HIGH (ref <150)

## 2019-03-13 ENCOUNTER — Encounter: Payer: Self-pay | Admitting: *Deleted

## 2019-03-13 ENCOUNTER — Telehealth: Payer: Self-pay | Admitting: Cardiology

## 2019-03-13 DIAGNOSIS — E782 Mixed hyperlipidemia: Secondary | ICD-10-CM

## 2019-03-13 NOTE — Telephone Encounter (Signed)
Letter of results sent to pt, referral to lipid clinic placed

## 2019-03-13 NOTE — Telephone Encounter (Signed)
New message   Patient received a phone call. He believes it could have been about lab results. Please call.

## 2019-04-04 ENCOUNTER — Other Ambulatory Visit: Payer: Self-pay | Admitting: Family Medicine

## 2019-04-05 ENCOUNTER — Other Ambulatory Visit: Payer: Self-pay | Admitting: Family Medicine

## 2019-04-05 DIAGNOSIS — R1900 Intra-abdominal and pelvic swelling, mass and lump, unspecified site: Secondary | ICD-10-CM

## 2019-04-17 ENCOUNTER — Ambulatory Visit
Admission: RE | Admit: 2019-04-17 | Discharge: 2019-04-17 | Disposition: A | Discharge status: Home / Self Care | Payer: Medicaid Other | Source: Ambulatory Visit | Attending: Family Medicine | Admitting: Family Medicine

## 2019-04-17 ENCOUNTER — Other Ambulatory Visit: Payer: Medicaid Other

## 2019-04-17 DIAGNOSIS — R1900 Intra-abdominal and pelvic swelling, mass and lump, unspecified site: Secondary | ICD-10-CM

## 2019-04-19 ENCOUNTER — Telehealth: Payer: Self-pay | Admitting: Internal Medicine

## 2019-04-19 ENCOUNTER — Other Ambulatory Visit: Payer: Medicaid Other

## 2019-04-19 ENCOUNTER — Telehealth (HOSPITAL_COMMUNITY): Payer: Self-pay | Admitting: *Deleted

## 2019-04-19 NOTE — Telephone Encounter (Signed)
Left Message per DPR containing following:  COVID-19 Pre-Screening Questions:  . Do you currently have a fever? vm (yes = cancel and refer to pcp for e-visit) . Have you recently travelled on a cruise, internationally, or to [place], [place], [place], [place], [place], or [place], [place] [employer]) ? vm (yes = cancel, stay home, monitor symptoms, and contact pcp or initiate e-visit if symptoms develop) . Have you been in contact with someone that is currently pending confirmation of Covid19 testing or has been confirmed to have the Covid19 virus?  vm (yes = cancel, stay home, away from tested individual, monitor symptoms, and contact pcp or initiate e-visit if symptoms develop) . Are you currently experiencing fatigue or cough? vm (yes = pt should be prepared to have a mask placed at the time of their visit).   . Reiterated no additional visitors. [name] no earlier than 15 minutes before appointment time. . Please bring own mask.  [name]

## 2019-04-22 ENCOUNTER — Ambulatory Visit (HOSPITAL_COMMUNITY): Payer: Medicaid Other | Attending: Cardiology

## 2019-04-22 ENCOUNTER — Other Ambulatory Visit: Payer: Self-pay

## 2019-04-22 DIAGNOSIS — R55 Syncope and collapse: Secondary | ICD-10-CM | POA: Insufficient documentation

## 2019-04-23 NOTE — Telephone Encounter (Signed)
LVM, reminding pt of his appt on 04-24-19 with Dr Debara Pickett.

## 2019-04-23 NOTE — Telephone Encounter (Signed)
Open n error °

## 2019-04-24 ENCOUNTER — Telehealth: Payer: Self-pay

## 2019-04-24 ENCOUNTER — Telehealth (INDEPENDENT_AMBULATORY_CARE_PROVIDER_SITE_OTHER): Payer: Medicaid Other | Admitting: Internal Medicine

## 2019-04-24 ENCOUNTER — Encounter: Payer: Self-pay | Admitting: Internal Medicine

## 2019-04-24 ENCOUNTER — Telehealth: Payer: Self-pay | Admitting: Internal Medicine

## 2019-04-24 VITALS — BP 110/60 | HR 87 | Ht 70.0 in | Wt 296.0 lb

## 2019-04-24 DIAGNOSIS — E1165 Type 2 diabetes mellitus with hyperglycemia: Secondary | ICD-10-CM | POA: Diagnosis not present

## 2019-04-24 DIAGNOSIS — I2510 Atherosclerotic heart disease of native coronary artery without angina pectoris: Secondary | ICD-10-CM

## 2019-04-24 DIAGNOSIS — E782 Mixed hyperlipidemia: Secondary | ICD-10-CM | POA: Diagnosis not present

## 2019-04-24 DIAGNOSIS — E66813 Obesity, class 3: Secondary | ICD-10-CM

## 2019-04-24 DIAGNOSIS — I10 Essential (primary) hypertension: Secondary | ICD-10-CM

## 2019-04-24 DIAGNOSIS — IMO0001 Reserved for inherently not codable concepts without codable children: Secondary | ICD-10-CM

## 2019-04-24 DIAGNOSIS — Z794 Long term (current) use of insulin: Secondary | ICD-10-CM

## 2019-04-24 MED ORDER — VASCEPA 1 G PO CAPS: 2.0000 g | ORAL_CAPSULE | Freq: Two times a day (BID) | ORAL | 11 refills | Status: DC

## 2019-04-24 NOTE — Telephone Encounter (Signed)
Prior Auth started for Rx Vascepa 1gm. PA # [id number]  TRACKS

## 2019-04-24 NOTE — Patient Instructions (Signed)
Medication Instructions:  START vasecepa 2 grams (2 capsules) twice daily = 4 capsules daily total If you need a refill on your cardiac medications before your next appointment, please call your pharmacy.   Lab work: FASTING lab work to check cholesterol in 3 months If you have labs (blood work) drawn today and your tests are completely normal, you will receive your results only by: Marland Kitchen MyChart Message (if you have MyChart) OR . A paper copy in the mail If you have any lab test that is abnormal or we need to change your treatment, we will call you to review the results.  Testing/Procedures: NONE  Follow-Up: Dr. Debara Pickett recommends that you schedule a follow up visit with him the in the [place] in 3 months.  Please have fasting blood work about 1 week prior to this visit and he will review the blood work results with you at your appointment.

## 2019-04-24 NOTE — Telephone Encounter (Signed)
Patient called to review e-visit instructions. Patient aware that the following changes have been made: start vascepa 2g PO BID - will need PA (patient aware) Patient aware that they will need the following labs: fasting lab work in 3 months Patient aware that they will need the following test(s): none  Recall for 3 month (lipid clinic) entered.   No further assistance needed at this time.

## 2019-04-24 NOTE — Progress Notes (Signed)
Virtual Visit via Telephone Note   This visit type was conducted due to national recommendations for restrictions regarding the COVID-19 Pandemic (e.g. social distancing) in an effort to limit this patient's exposure and mitigate transmission in our community.  Due to his co-morbid illnesses, this patient is at least at moderate risk for complications without adequate follow up.  This format is felt to be most appropriate for this patient at this time.  The patient did not have access to video technology/had technical difficulties with video requiring transitioning to audio format only (telephone).  All issues noted in this document were discussed and addressed.  No physical exam could be performed with this format.  Please refer to the patient's chart for his  consent to telehealth for [facility].   Evaluation Performed:  Video visit  Date:  04/24/2019   ID:  [name], DOB 1960-11-24, MRN [id number]  Patient Location:  [place] [id number]  Provider location:   [address], [place] [address] [place], [place] [address]  PCP:  [name], FNP  Cardiologist:  No primary care provider on file. Electrophysiologist:  None   Chief Complaint:  Lipid clinic visit  History of Present Illness:    [name] is a 58 y.o. male who presents via audio/video conferencing for a telehealth visit today.  [name] is a pleasant 58 year old male patient who I previously took care of back in 2015.  In fact he underwent cardiac catheterization by myself which showed just moderate LAD disease at the time.  He had been managed medically for this.  Other comorbidities include morbid obesity, insulin-dependent diabetes with recently worse blood sugar controlled hemoglobin A1c 9.8.  He has dyslipidemia with persistently elevated triglycerides which has ranged between 202 and 250 on treatment with atorvastatin 80 mg, however recently a repeat lipid profile showed  markedly elevated triglycerides near 600.  Over the past year he is suffered from chronic pain and is received steroids, he also has a history of pancreatitis in the past.  Is not clear whether this was related to elevated triglycerides or not.  He ultimately had progression of coronary disease and received a stent to the LAD in August 2017 and transition care over to Dr. Stanford Breed.  He is currently referred today for evaluation and management of elevated triglycerides.  The patient does not have symptoms concerning for COVID-19 infection (fever, chills, cough, or new SHORTNESS OF BREATH).    Prior CV studies:   The following studies were reviewed today:  Chart review Lab work  PMHx:  Past Medical History:  Diagnosis Date  . Anemia   . Arthritis   . Back pain   . CAD (coronary artery disease)    a. s/p DES to LAD in 05/2016  . Cervical radiculopathy   . Chronic diastolic CHF (congestive heart failure) ([place])   . Chronic pain   . Depression   . DVT (deep venous thrombosis) ([place])   . Hematemesis   . Hepatic steatosis   . Hyperlipidemia   . Hypertension   . IBS (irritable bowel syndrome)   . Morbid obesity ([place])   . OSA (obstructive sleep apnea)   . Pancreatitis   . PE (pulmonary thromboembolism) ([place])   . Peripheral neuropathy   . PUD (peptic ulcer disease)   . Renal disorder   . Stroke [facility])    a. ?details unclear - not seen on imaging when he was admitted in 05/2017  for TIA symptoms which were felt due to cervical radiculopathy.  . Thoracic aortic ectasia ([facility])    a. 4.3cm ectatic ascending thoracic aorta by CT 06/2017.   . Type 2 diabetes mellitus ([place])     Past Surgical History:  Procedure Laterality Date  . CARDIAC CATHETERIZATION N/A 05/31/2016   Procedure: Left Heart Cath and Coronary Angiography;  Surgeon: Peter M Martinique, MD;  Location: [place] [facility];  Service: Cardiovascular;  Laterality: N/A;  . CARDIAC CATHETERIZATION N/A 05/31/2016   Procedure: Intravascular  Pressure Wire/FFR Study;  Surgeon: Peter M Martinique, MD;  Location: [place] [facility];  Service: Cardiovascular;  Laterality: N/A;  . CARDIAC CATHETERIZATION N/A 05/31/2016   Procedure: Coronary Stent Intervention;  Surgeon: Peter M Martinique, MD;  Location: [place] [facility];  Service: Cardiovascular;  Laterality: N/A;  . LEFT HEART CATH AND CORONARY ANGIOGRAPHY N/A 12/08/2017   Procedure: LEFT HEART CATH AND CORONARY ANGIOGRAPHY;  Surgeon: Leonie Man, MD;  Location: [place] CV [facility];  Service: Cardiovascular;  Laterality: N/A;  . LEFT HEART CATHETERIZATION WITH CORONARY ANGIOGRAM N/A 02/03/2014   Procedure: LEFT HEART CATHETERIZATION WITH CORONARY ANGIOGRAM;  Surgeon: Pixie Casino, MD;  Location: [facility] CATH LAB;  Service: Cardiovascular;  Laterality: N/A;  . left leg stent       FAMHx:  Family History  Problem Relation Age of Onset  . Cancer Father   . Hypertension Mother   . Diabetes Mother   . Breast cancer Mother   . Hypertension Brother   . Diabetes Brother   . Hypertension Sister   . Diabetes Sister     SOCHx:   reports that he has never smoked. He has never used smokeless tobacco. He reports that he does not drink alcohol or use drugs.  ALLERGIES:  Allergies  Allergen Reactions  . Coconut Flavor [Flavoring Agent] Hives  . Coconut Oil Hives  . Ibuprofen Other (See Comments)    Made gastric ulcers worse  . Aleve [Naproxen] Other (See Comments)    DUE TO KIDNEYS  . Nsaids Other (See Comments)    Stomach ulcers     MEDS:  Current Meds  Medication Sig  . Albuterol Sulfate (PROAIR RESPICLICK) 245 (90 Base) MCG/ACT AEPB Inhale 2 puffs into the lungs every 6 (six) hours as needed.  Marland Kitchen aspirin 81 MG chewable tablet Chew 1 tablet (81 mg total) by mouth daily.  Marland Kitchen atorvastatin (LIPITOR) 80 MG tablet Take 1 tablet (80 mg total) by mouth daily.  . carvedilol (COREG) 25 MG tablet Take 1 tablet (25 mg total) by mouth 2 (two) times daily with a meal.  . diclofenac (FLECTOR)  1.3 % PTCH Place 1 patch onto the skin daily as needed.  . diclofenac sodium (VOLTAREN) 1 % GEL Apply 1 application topically 4 (four) times daily.  . DULoxetine (CYMBALTA) 60 MG capsule TAKE 1 CAPSULE BY MOUTH EVERY DAY  . famotidine (PEPCID) 40 MG tablet Take 1 tablet (40 mg total) by mouth at bedtime.  Marland Kitchen FARXIGA 10 MG TABS tablet Take 10 mg by mouth daily.  . furosemide (LASIX) 40 MG tablet Take 40 mg by mouth 3 (three) times daily.  Marland Kitchen glipiZIDE (GLUCOTROL) 10 MG tablet Take 10 mg by mouth 2 (two) times daily.  . Insulin Detemir (LEVEMIR FLEXTOUCH) 100 UNIT/ML Pen Inject 55 Units into the skin daily.  . Insulin Pen Needle 31G X 5 MM MISC Use 1 needle daily to inject insulin as prescribed  . isosorbide mononitrate (IMDUR) 60  MG 24 hr tablet Take 1 tablet (60 mg total) by mouth daily.  Marland Kitchen lisinopril (PRINIVIL,ZESTRIL) 10 MG tablet Take 1 tablet (10 mg total) by mouth daily.  . metFORMIN (GLUCOPHAGE-XR) 500 MG 24 hr tablet Take 2 tablets (1,000 mg total) by mouth 2 (two) times daily.  . nitroGLYCERIN (NITROSTAT) 0.4 MG SL tablet Place 1 tablet (0.4 mg total) under the tongue every 5 (five) minutes as needed for chest pain.  Marland Kitchen oxyCODONE (ROXICODONE) 15 MG immediate release tablet Take 15 mg 3 (three) times daily as needed by mouth for pain.   . potassium chloride SA (K-DUR,KLOR-CON) 20 MEQ tablet Take 20-40 mEq by mouth daily. 40 meq in the lunch, 20 med at dinner and 20 meq at bedtime  . sertraline (ZOLOFT) 50 MG tablet Take 50 mg by mouth daily.  Marland Kitchen tiZANidine (ZANAFLEX) 2 MG tablet Take 2 mg by mouth 2 (two) times daily as needed for pain.  . traZODone (DESYREL) 50 MG tablet Take 1 tablet (50 mg total) by mouth at bedtime.     ROS: Pertinent items noted in HPI and remainder of comprehensive ROS otherwise negative.  Labs/Other Tests and Data Reviewed:    Recent Labs: 11/07/2018: Hemoglobin 10.2; Platelets 250 03/08/2019: ALT 15; BUN 15; Creat 0.74; Potassium 4.0; Sodium 139   Recent Lipid  Panel Lab Results  Component Value Date/Time   CHOL 152 03/08/2019 10:08 AM   TRIG 599 (H) 03/08/2019 10:08 AM   HDL 35 (L) 03/08/2019 10:08 AM   CHOLHDL 4.3 03/08/2019 10:08 AM   LDLCALC  03/08/2019 10:08 AM     Comment:     . LDL cholesterol not calculated. Triglyceride levels greater than 400 mg/dL invalidate calculated LDL results. . Reference range: <100 . Desirable range <100 mg/dL for primary prevention;   <70 mg/dL for patients with CHD or diabetic patients  with > or = 2 CHD risk factors. Marland Kitchen LDL-C is now calculated using the Martin-Hopkins  calculation, which is a validated novel method providing  better accuracy than the Friedewald equation in the  estimation of LDL-C.  Cresenciano Genre et al. Annamaria Helling. 8938;101(75): 2061-2068  (http://education.[employer].com/faq/FAQ164)     Wt Readings from Last 3 Encounters:  04/24/19 296 lb (134.3 kg)  02/26/19 (!) 301 lb (136.5 kg)  02/06/19 (!) 300 lb 6.4 oz (136.3 kg)     Exam:    Vital Signs:  BP 110/60   Pulse 87   Ht 5\' 10"  (1.778 m)   Wt 296 lb (134.3 kg)   BMI 42.47 kg/m    Exam not performed due to telephone visit  ASSESSMENT & PLAN:    1. Coronary artery disease status post PCI to the LAD (2017) 2. Insulin-dependent diabetes-uncontrolled, hemoglobin A1c 9.8 3. Morbid obesity with recent weight loss 4. Hypertension 5. Mixed dyslipidemia with very high triglycerides 6. Intermittent prescription steroid use 7. History of pancreatitis  Mr. Luckow has a history of coronary artery disease and multiple cardiovascular risk factors including poorly controlled diabetes with elevated blood sugar, recent steroid use, morbid obesity and a history of pancreatitis.  Multiple factors as above have contributed to elevated triglycerides include the fact that his study was nonfasting.  He mentioned that he had had some food just prior to the lab work.  However, if we adjust this down about 20% to accommodate for fasting, his  triglycerides remain quite elevated.  Per data from the reduce it trial he has had persistently elevated triglycerides despite LDL at goal, therefore remains at  residual risk.  I think he is a good candidate for Vascepa 2 g twice daily in addition to his current regimen.  He will need to continue to work on weight loss, reduction in carbohydrates and saturated fats.  He still drinks a moderate amount of soda which would help if he could abstain from that.  More physical activity would be helpful as well.  We will plan a repeat lipid profile in about 3 months with direct LDL and follow-up with me then in the clinic.  Thanks again for the kind referral.  COVID-19 Education: The signs and symptoms of COVID-19 were discussed with the patient and how to seek care for testing (follow up with PCP or arrange E-visit).  The importance of social distancing was discussed today.  Patient Risk:   After full review of this patients clinical status, I feel that they are at least moderate risk at this time.  Time:   Today, I have spent 25 minutes with the patient with telehealth technology discussing dyslipidemia, persistent coronary risk, coronary artery disease, management of triglycerides.     Medication Adjustments/Labs and Tests Ordered: Current medicines are reviewed at length with the patient today.  Concerns regarding medicines are outlined above.   Tests Ordered: Orders Placed This Encounter  Procedures  . Lipid panel  . LDL cholesterol, direct    Medication Changes: Meds ordered this encounter  Medications  . Icosapent Ethyl (VASCEPA) 1 g CAPS    Sig: Take 2 capsules (2 g total) by mouth 2 (two) times a day.    Dispense:  120 capsule    Refill:  11    Disposition:  in 3 month(s)  Pixie Casino, MD, [facility], [place] Director of the [facility] Lipid Disorders &  Cardiovascular Risk Reduction Clinic Diplomate of the American Board of Clinical Lipidology  Attending Cardiologist  Direct Dial: [phone]  Fax: [phone]  Website:  [email or web address]  Pixie Casino, MD  04/24/2019 9:00 AM

## 2019-04-29 ENCOUNTER — Telehealth: Payer: Self-pay | Admitting: Internal Medicine

## 2019-04-29 NOTE — Telephone Encounter (Signed)
PA for vascepa submitted to [employer] @ [phone] Faxed in [place] [facility] PA form, letter, MD note, recent labs

## 2019-05-04 ENCOUNTER — Other Ambulatory Visit: Payer: Self-pay

## 2019-05-04 ENCOUNTER — Emergency Department (HOSPITAL_COMMUNITY): Payer: Medicaid Other

## 2019-05-04 ENCOUNTER — Observation Stay (HOSPITAL_COMMUNITY)
Admission: EM | Admit: 2019-05-04 | Discharge: 2019-05-06 | Disposition: A | Discharge status: Home / Self Care | Payer: Medicaid Other | Attending: Internal Medicine | Admitting: Internal Medicine

## 2019-05-04 DIAGNOSIS — R072 Precordial pain: Secondary | ICD-10-CM | POA: Insufficient documentation

## 2019-05-04 DIAGNOSIS — Z8673 Personal history of transient ischemic attack (TIA), and cerebral infarction without residual deficits: Secondary | ICD-10-CM | POA: Diagnosis not present

## 2019-05-04 DIAGNOSIS — I110 Hypertensive heart disease with heart failure: Secondary | ICD-10-CM | POA: Insufficient documentation

## 2019-05-04 DIAGNOSIS — G4733 Obstructive sleep apnea (adult) (pediatric): Secondary | ICD-10-CM | POA: Diagnosis not present

## 2019-05-04 DIAGNOSIS — Z794 Long term (current) use of insulin: Secondary | ICD-10-CM | POA: Diagnosis not present

## 2019-05-04 DIAGNOSIS — Z7982 Long term (current) use of aspirin: Secondary | ICD-10-CM | POA: Insufficient documentation

## 2019-05-04 DIAGNOSIS — Z955 Presence of coronary angioplasty implant and graft: Secondary | ICD-10-CM | POA: Diagnosis not present

## 2019-05-04 DIAGNOSIS — E785 Hyperlipidemia, unspecified: Secondary | ICD-10-CM | POA: Diagnosis not present

## 2019-05-04 DIAGNOSIS — Z86718 Personal history of other venous thrombosis and embolism: Secondary | ICD-10-CM | POA: Diagnosis not present

## 2019-05-04 DIAGNOSIS — F329 Major depressive disorder, single episode, unspecified: Secondary | ICD-10-CM | POA: Insufficient documentation

## 2019-05-04 DIAGNOSIS — I5032 Chronic diastolic (congestive) heart failure: Secondary | ICD-10-CM | POA: Insufficient documentation

## 2019-05-04 DIAGNOSIS — Z8249 Family history of ischemic heart disease and other diseases of the circulatory system: Secondary | ICD-10-CM | POA: Insufficient documentation

## 2019-05-04 DIAGNOSIS — Z6841 Body Mass Index (BMI) 40.0 and over, adult: Secondary | ICD-10-CM | POA: Insufficient documentation

## 2019-05-04 DIAGNOSIS — I2510 Atherosclerotic heart disease of native coronary artery without angina pectoris: Secondary | ICD-10-CM | POA: Diagnosis not present

## 2019-05-04 DIAGNOSIS — E1140 Type 2 diabetes mellitus with diabetic neuropathy, unspecified: Secondary | ICD-10-CM | POA: Diagnosis not present

## 2019-05-04 DIAGNOSIS — E1110 Type 2 diabetes mellitus with ketoacidosis without coma: Principal | ICD-10-CM | POA: Diagnosis present

## 2019-05-04 DIAGNOSIS — Z8711 Personal history of peptic ulcer disease: Secondary | ICD-10-CM | POA: Insufficient documentation

## 2019-05-04 DIAGNOSIS — Z833 Family history of diabetes mellitus: Secondary | ICD-10-CM | POA: Diagnosis not present

## 2019-05-04 DIAGNOSIS — Z79899 Other long term (current) drug therapy: Secondary | ICD-10-CM | POA: Insufficient documentation

## 2019-05-04 DIAGNOSIS — K589 Irritable bowel syndrome without diarrhea: Secondary | ICD-10-CM | POA: Insufficient documentation

## 2019-05-04 DIAGNOSIS — Z886 Allergy status to analgesic agent status: Secondary | ICD-10-CM | POA: Insufficient documentation

## 2019-05-04 DIAGNOSIS — R079 Chest pain, unspecified: Secondary | ICD-10-CM | POA: Diagnosis present

## 2019-05-04 DIAGNOSIS — H538 Other visual disturbances: Secondary | ICD-10-CM | POA: Diagnosis not present

## 2019-05-04 DIAGNOSIS — Z1159 Encounter for screening for other viral diseases: Secondary | ICD-10-CM | POA: Diagnosis not present

## 2019-05-04 DIAGNOSIS — I10 Essential (primary) hypertension: Secondary | ICD-10-CM | POA: Diagnosis present

## 2019-05-04 DIAGNOSIS — F419 Anxiety disorder, unspecified: Secondary | ICD-10-CM | POA: Insufficient documentation

## 2019-05-04 DIAGNOSIS — R0789 Other chest pain: Secondary | ICD-10-CM | POA: Diagnosis present

## 2019-05-04 LAB — CBC
HCT: 41.1 % (ref 39.0–52.0)
Hemoglobin: 13.5 g/dL (ref 13.0–17.0)
MCH: 30.2 pg (ref 26.0–34.0)
MCHC: 32.8 g/dL (ref 30.0–36.0)
MCV: 91.9 fL (ref 80.0–100.0)
Platelets: 239 10*3/uL (ref 150–400)
RBC: 4.47 MIL/uL (ref 4.22–5.81)
RDW: 15 % (ref 11.5–15.5)
WBC: 9.5 10*3/uL (ref 4.0–10.5)
nRBC: 0 % (ref 0.0–0.2)

## 2019-05-04 LAB — DIFFERENTIAL
Abs Immature Granulocytes: 0.02 10*3/uL (ref 0.00–0.07)
Basophils Absolute: 0 10*3/uL (ref 0.0–0.1)
Basophils Relative: 0 %
Eosinophils Absolute: 0.1 10*3/uL (ref 0.0–0.5)
Eosinophils Relative: 1 %
Immature Granulocytes: 0 %
Lymphocytes Relative: 31 %
Lymphs Abs: 2.9 10*3/uL (ref 0.7–4.0)
Monocytes Absolute: 0.8 10*3/uL (ref 0.1–1.0)
Monocytes Relative: 8 %
Neutro Abs: 5.7 10*3/uL (ref 1.7–7.7)
Neutrophils Relative %: 60 %

## 2019-05-04 LAB — BASIC METABOLIC PANEL
Anion gap: 17 — ABNORMAL HIGH (ref 5–15)
BUN: 12 mg/dL (ref 6–20)
CO2: 18 mmol/L — ABNORMAL LOW (ref 22–32)
Calcium: 9.6 mg/dL (ref 8.9–10.3)
Chloride: 102 mmol/L (ref 98–111)
Creatinine, Ser: 1.15 mg/dL (ref 0.61–1.24)
GFR calc Af Amer: >60 mL/min (ref >60)
GFR calc non Af Amer: >60 mL/min (ref >60)
Glucose, Bld: 335 mg/dL — ABNORMAL HIGH (ref 70–99)
Potassium: 4.4 mmol/L (ref 3.5–5.1)
Sodium: 137 mmol/L (ref 135–145)

## 2019-05-04 LAB — TROPONIN I (HIGH SENSITIVITY): Troponin I (High Sensitivity): 5 ng/L (ref <18)

## 2019-05-04 LAB — PROTIME-INR
INR: 0.9 (ref 0.8–1.2)
Prothrombin Time: 12.3 seconds (ref 11.4–15.2)

## 2019-05-04 LAB — APTT: aPTT: 28 seconds (ref 24–36)

## 2019-05-04 MED ORDER — OXYCODONE-ACETAMINOPHEN 5-325 MG PO TABS
1.0000 | ORAL_TABLET | ORAL | Status: DC | PRN
  Administered 2019-05-04: 21:00:00 1 via ORAL
  Filled 2019-05-04: qty 1

## 2019-05-04 MED ORDER — SODIUM CHLORIDE 0.9% FLUSH
3.0000 mL | Freq: Once | INTRAVENOUS | Status: AC
  Administered 2019-05-04: 3 mL via INTRAVENOUS

## 2019-05-04 NOTE — ED Triage Notes (Addendum)
Per EMS pt called to report feelings like his past TIAs. Endorses left-sided chest pain non radiating, headache, double vision, and states he normally is hypertensive. CHF hx. Swelling in abdomen per pt. VSS 124/84  95%, HR 120  Given 324 ASA no nitro. 20 g L hand  Pt;'s neuro exam intact in triage. NIH 0

## 2019-05-04 NOTE — ED Provider Notes (Signed)
[facility] EMERGENCY DEPARTMENT Provider Note   CSN: [id number] Arrival date & time: 05/04/19  2015    History   Chief Complaint Chief Complaint  Patient presents with   Chest Pain   Blurred Vision    HPI [name] is a 58 y.o. male with a PMHx of CAD s/p LAD stent placement in 2017, CHF, PE, TIA, T2DM on insulin and chronic pain who presents to the ED for evaluation of left-sided chest pain.   [name] states his symptoms began suddenly at 7:15pm, approximately 4 hours ago, after becoming angry. Symptoms began as left-sided chest pain that radiates to his jaw, neck and left arm. Pain has been consistent since and is described as stabbing. Pain is worsened by talking or movement, no alleviating factors. Has not taken any nitroglycerin due to HA side effect.He also endorses SOB that is worsened by walking. He denies fever, chills, cough. He also c/o headache that is concentrated behind his eyes, blurry vision, double vision and dizziness. He denies any syncope or head trauma. [name] is also experiencing left foot numbness and tingling. He denies pain in his right sided arm or leg. He states current symptoms feel like his past TIA. [name] reports he is in significant pain all over and only oxycodone reliefs his pain generally.   He is not currently taking any blood thinners.     The history is provided by the patient.  Chest Pain Associated symptoms: abdominal pain (Right sided, chronic), back pain (chronic), dizziness, headache, numbness and shortness of breath   Associated symptoms: no cough, no fever, no nausea and no vomiting     Past Medical History:  Diagnosis Date   Anemia    Arthritis    Back pain    CAD (coronary artery disease)    a. s/p DES to LAD in 05/2016   Cervical radiculopathy    Chronic diastolic CHF (congestive heart failure) (HCC)    Chronic pain    Depression    DVT (deep venous thrombosis) (HCC)     Hematemesis    Hepatic steatosis    Hyperlipidemia    Hypertension    IBS (irritable bowel syndrome)    Morbid obesity (HCC)    OSA (obstructive sleep apnea)    Pancreatitis    PE (pulmonary thromboembolism) ([place])    Peripheral neuropathy    PUD (peptic ulcer disease)    Renal disorder    Stroke [facility])    a. ?details unclear - not seen on imaging when he was admitted in 05/2017 for TIA symptoms which were felt due to cervical radiculopathy.   Thoracic aortic ectasia (HCC)    a. 4.3cm ectatic ascending thoracic aorta by CT 06/2017.    Type 2 diabetes mellitus [facility])     Patient Active Problem List   Diagnosis Date Noted   DKA (diabetic ketoacidoses) ([place]) 05/05/2019   Blurred vision 05/05/2019   Alteration consciousness 11/19/2018   Neck pain 10/09/2018   Left arm weakness 10/09/2018   B12 deficiency 08/10/2017   Persistent headaches 08/09/2017   GERD (gastroesophageal reflux disease) 06/29/2017   Left arm numbness    Cerebral embolism with cerebral infarction 06/17/2017   TIA (transient ischemic attack) 06/17/2017   Chronic back pain 12/29/2016   Depression 12/15/2016   Vitamin D deficiency 12/09/2016   Right hip pain 12/07/2016   Hypokalemia 12/07/2016   Type 2 diabetes mellitus with vascular disease ([place]) 05/31/2016   Normocytic normochromic anemia 05/31/2016  Chest pain 05/31/2016   CAD (coronary artery disease) 01/06/2016   DVT (deep venous thrombosis) ([place]) 01/06/2016   Lactic acidosis 05/28/2014   Nonspecific chest pain 01/29/2014   Uncontrolled secondary diabetes with peripheral neuropathy ([place]) 01/29/2014   Obesity, Class III, BMI 40-49.9 (morbid obesity) ([place]) 01/29/2014   Snoring 01/29/2014   Dyslipidemia 01/29/2014   HTN (hypertension) 01/29/2014   Abnormal nuclear stress test 01/29/2014    Past Surgical History:  Procedure Laterality Date   CARDIAC CATHETERIZATION N/A 05/31/2016   Procedure: Left Heart Cath and  Coronary Angiography;  Surgeon: Peter M Martinique, MD;  Location: [place] CV LAB;  Service: Cardiovascular;  Laterality: N/A;   CARDIAC CATHETERIZATION N/A 05/31/2016   Procedure: Intravascular Pressure Wire/FFR Study;  Surgeon: Peter M Martinique, MD;  Location: [place] CV LAB;  Service: Cardiovascular;  Laterality: N/A;   CARDIAC CATHETERIZATION N/A 05/31/2016   Procedure: Coronary Stent Intervention;  Surgeon: Peter M Martinique, MD;  Location: [place] CV LAB;  Service: Cardiovascular;  Laterality: N/A;   LEFT HEART CATH AND CORONARY ANGIOGRAPHY N/A 12/08/2017   Procedure: LEFT HEART CATH AND CORONARY ANGIOGRAPHY;  Surgeon: Leonie Man, MD;  Location: [place] CV LAB;  Service: Cardiovascular;  Laterality: N/A;   LEFT HEART CATHETERIZATION WITH CORONARY ANGIOGRAM N/A 02/03/2014   Procedure: LEFT HEART CATHETERIZATION WITH CORONARY ANGIOGRAM;  Surgeon: Pixie Casino, MD;  Location: [facility] CATH LAB;  Service: Cardiovascular;  Laterality: N/A;   left leg stent           Home Medications    Prior to Admission medications   Medication Sig Start Date End Date Taking? Authorizing Provider  Albuterol Sulfate (PROAIR RESPICLICK) 992 (90 Base) MCG/ACT AEPB Inhale 2 puffs into the lungs every 6 (six) hours as needed. Patient taking differently: Inhale 2 puffs into the lungs every 6 (six) hours as needed (for breathing).  11/06/18  Yes Robyn Haber, MD  aspirin 81 MG chewable tablet Chew 1 tablet (81 mg total) by mouth daily. 06/01/16  Yes Elwin Mocha, MD  atorvastatin (LIPITOR) 80 MG tablet Take 1 tablet (80 mg total) by mouth daily. 01/26/17  Yes Strader, Fransisco Hertz, PA-C  carvedilol (COREG) 25 MG tablet Take 1 tablet (25 mg total) by mouth 2 (two) times daily with a meal. 01/26/17  Yes Strader, [place] M, PA-C  diclofenac (FLECTOR) 1.3 % PTCH Place 1 patch onto the skin daily as needed (for pain).  12/31/18  Yes [provider]  diclofenac sodium (VOLTAREN) 1 % GEL Apply 1  application topically 4 (four) times daily. Patient taking differently: Apply 1 application topically 4 (four) times daily as needed (for pain).  04/05/18  Yes Melynda Ripple, MD  DULoxetine (CYMBALTA) 60 MG capsule TAKE 1 CAPSULE BY MOUTH EVERY DAY Patient taking differently: Take 60 mg by mouth daily.  12/24/18  Yes Marcial Pacas, MD  FARXIGA 10 MG TABS tablet Take 10 mg by mouth daily. 01/25/19  Yes [provider]  furosemide (LASIX) 40 MG tablet Take 40 mg by mouth 3 (three) times daily.   Yes [provider]  glipiZIDE (GLUCOTROL) 10 MG tablet Take 10 mg by mouth 2 (two) times daily. 05/30/17  Yes [provider]  Icosapent Ethyl (VASCEPA) 1 g CAPS Take 2 capsules (2 g total) by mouth 2 (two) times a day. 04/24/19  Yes Hilty, Nadean Corwin, MD  Insulin Detemir (LEVEMIR FLEXTOUCH) 100 UNIT/ML Pen Inject 55 Units into the skin daily. 01/25/19  Yes [provider]  isosorbide mononitrate (IMDUR) 60 MG 24 hr tablet Take 1 tablet (60 mg total) by mouth daily. 02/06/19  Yes Skeet Latch, MD  lisinopril (PRINIVIL,ZESTRIL) 10 MG tablet Take 1 tablet (10 mg total) by mouth daily. 12/10/17  Yes Velvet Bathe, MD  metFORMIN (GLUCOPHAGE-XR) 500 MG 24 hr tablet Take 2 tablets (1,000 mg total) by mouth 2 (two) times daily. 07/02/17  Yes Barton Dubois, MD  nitroGLYCERIN (NITROSTAT) 0.4 MG SL tablet Place 1 tablet (0.4 mg total) under the tongue every 5 (five) minutes as needed for chest pain. 02/06/19  Yes Skeet Latch, MD  oxyCODONE (ROXICODONE) 15 MG immediate release tablet Take 15 mg 3 (three) times daily as needed by mouth for pain.  08/28/17  Yes [provider]  potassium chloride SA (K-DUR,KLOR-CON) 20 MEQ tablet Take 20-40 mEq by mouth See admin instructions. Take 2 tablets at lunch then take 1 tablet at dinner and at bedtime   Yes [provider]  sertraline (ZOLOFT) 50 MG tablet Take 50 mg by mouth daily.   Yes [provider]  tiZANidine  (ZANAFLEX) 2 MG tablet Take 2 mg by mouth 2 (two) times daily as needed for pain.   Yes [provider]  traZODone (DESYREL) 50 MG tablet Take 1 tablet (50 mg total) by mouth at bedtime. 12/15/16  Yes Burns, Arloa Koh, MD  famotidine (PEPCID) 40 MG tablet Take 1 tablet (40 mg total) by mouth at bedtime. Patient not taking: Reported on 05/05/2019 06/18/17   Barton Dubois, MD  Insulin Pen Needle 31G X 5 MM MISC Use 1 needle daily to inject insulin as prescribed 06/18/17   Barton Dubois, MD    Family History Family History  Problem Relation Age of Onset   Cancer Father    Hypertension Mother    Diabetes Mother    Breast cancer Mother    Hypertension Brother    Diabetes Brother    Hypertension Sister    Diabetes Sister     Social History Social History   Tobacco Use   Smoking status: Never Smoker   Smokeless tobacco: Never Used  Substance Use Topics   Alcohol use: No   Drug use: No     Allergies   Coconut flavor [flavoring agent], Coconut oil, Ibuprofen, Aleve [naproxen], and Nsaids   Review of Systems Review of Systems  Constitutional: Negative for chills and fever.  HENT: Negative for rhinorrhea and sore throat.   Eyes: Positive for visual disturbance (double vision, blurry vision).  Respiratory: Positive for shortness of breath. Negative for cough.   Cardiovascular: Positive for chest pain and leg swelling.  Gastrointestinal: Positive for abdominal pain (Right sided, chronic) and diarrhea (chronic). Negative for nausea and vomiting.  Musculoskeletal: Positive for arthralgias, back pain (chronic) and myalgias.  Skin: Negative for rash and wound.  Neurological: Positive for dizziness, numbness and headaches. Negative for syncope.  All other systems reviewed and are negative.    Physical Exam Updated Vital Signs BP (!) 144/71 (BP Location: Left Arm)    Pulse 85    Temp (!) 97.5 F (36.4 C) (Oral)    Resp 19    Ht 5\' 10"  (1.778 m)    Wt (!) 136.3 kg     SpO2 93%    BMI 43.12 kg/m   Physical Exam Constitutional:      General: He is not in acute distress.    Appearance: He is obese. He is not toxic-appearing.  HENT:     Head: Normocephalic and atraumatic.  Eyes:     Extraocular Movements: Extraocular movements intact.     Pupils: Pupils are equal, round, and reactive to light.  Neck:     Musculoskeletal: Normal range of motion and neck supple.     Vascular: No JVD.  Cardiovascular:     Rate and Rhythm: Regular rhythm. Tachycardia present.     Heart sounds: Normal heart sounds. No murmur. No S3 or S4 sounds.   Pulmonary:     Effort: Pulmonary effort is normal. Tachypnea present. No respiratory distress.     Breath sounds: Normal breath sounds.  Chest:     Chest wall: Tenderness (left sided tenderness to palpation) present. No deformity, crepitus or edema.  Abdominal:     General: Bowel sounds are normal. There is no abdominal bruit.     Palpations: Abdomen is soft.     Tenderness: There is no abdominal tenderness. There is no guarding or rebound.  Skin:    General: Skin is warm and dry.     Capillary Refill: Capillary refill takes less than 2 seconds.  Neurological:     General: No focal deficit present.     Mental Status: He is alert and oriented to person, place, and time.  Psychiatric:        Mood and Affect: Mood normal.        Behavior: Behavior normal.      ED Treatments / Results  Labs (all labs ordered are listed, but only abnormal results are displayed) Labs Reviewed  BASIC METABOLIC PANEL - Abnormal; Notable for the following components:      Result Value   CO2 18 (*)    Glucose, Bld 335 (*)    Anion gap 17 (*)    All other components within normal limits  BETA-HYDROXYBUTYRIC ACID - Abnormal; Notable for the following components:   Beta-Hydroxybutyric Acid 0.91 (*)    All other components within normal limits  URINALYSIS, ROUTINE W REFLEX MICROSCOPIC - Abnormal; Notable for the following components:    Specific Gravity, Urine 1.033 (*)    Glucose, UA >=500 (*)    Ketones, ur 20 (*)    All other components within normal limits  BASIC METABOLIC PANEL - Abnormal; Notable for the following components:   Glucose, Bld 229 (*)    All other components within normal limits  BASIC METABOLIC PANEL - Abnormal; Notable for the following components:   Glucose, Bld 220 (*)    All other components within normal limits  HEMOGLOBIN A1C - Abnormal; Notable for the following components:   Hgb A1c MFr Bld 10.5 (*)    All other components within normal limits  GLUCOSE, CAPILLARY - Abnormal; Notable for the following components:   Glucose-Capillary 211 (*)    All other components within normal limits  GLUCOSE, CAPILLARY - Abnormal; Notable for the following components:   Glucose-Capillary 194 (*)    All other components within normal limits  GLUCOSE, CAPILLARY - Abnormal; Notable for the following components:   Glucose-Capillary 181 (*)    All other components within normal limits  CBG MONITORING, ED - Abnormal; Notable for the following components:   Glucose-Capillary 226 (*)    All other components within normal limits  POCT I-STAT EG7 - Abnormal; Notable for the following components:   pH, Ven 7.438 (*)    pCO2, Ven 37.9 (*)    pO2, Ven 61.0 (*)    All other components within normal limits  CBG MONITORING, ED - Abnormal; Notable for the following components:  Glucose-Capillary 215 (*)    All other components within normal limits  CBG MONITORING, ED - Abnormal; Notable for the following components:   Glucose-Capillary 226 (*)    All other components within normal limits  NOVEL CORONAVIRUS, NAA (HOSPITAL ORDER, SEND-OUT TO REF LAB)  CBC  PROTIME-INR  APTT  DIFFERENTIAL  D-DIMER, QUANTITATIVE (NOT AT [facility])  CK TOTAL AND CKMB (NOT AT [facility])  HIV ANTIBODY (ROUTINE TESTING W REFLEX)  BASIC METABOLIC PANEL  BASIC METABOLIC PANEL  BASIC METABOLIC PANEL  I-STAT CHEM 8, ED  I-STAT VENOUS BLOOD  GAS, ED  TROPONIN I (HIGH SENSITIVITY)  TROPONIN I (HIGH SENSITIVITY)  TROPONIN I (HIGH SENSITIVITY)  TROPONIN I (HIGH SENSITIVITY)    EKG None  Radiology Dg Chest 2 View  Result Date: 05/04/2019 CLINICAL DATA:  Acute onset left chest pain radiating to jaw 1 hour ago. Coronary artery disease. Diabetes. EXAM: CHEST - 2 VIEW COMPARISON:  11/07/2018 FINDINGS: The heart size and mediastinal contours are within normal limits. Both lungs are clear. Incidental note is made of diffuse idiopathic skeletal hyperostosis involving the thoracic spine. IMPRESSION: No active cardiopulmonary disease. Electronically Signed   By: Marlaine Hind M.D.   On: 05/04/2019 20:50   Ct Head Wo Contrast  Result Date: 05/04/2019 CLINICAL DATA:  Left-sided chest pain.  Headache and double vision. EXAM: CT HEAD WITHOUT CONTRAST TECHNIQUE: Contiguous axial images were obtained from the base of the skull through the vertex without intravenous contrast. COMPARISON:  July 03, 2018 FINDINGS: Brain: No evidence of acute infarction, hemorrhage, hydrocephalus, extra-axial collection or mass lesion/mass effect. Vascular: Calcified atherosclerosis is seen in the intracranial carotids. Skull: Normal. Negative for fracture or focal lesion. Sinuses/Orbits: No acute finding. Other: None. IMPRESSION: No acute intracranial abnormalities identified. Electronically Signed   By: Dorise Bullion III M.D   On: 05/04/2019 21:15    Procedures Procedures (including critical care time)  Medications Ordered in ED Medications  oxyCODONE-acetaminophen (PERCOCET/ROXICET) 5-325 MG per tablet 1 tablet (1 tablet Oral Not Given 05/04/19 2332)  0.9 %  sodium chloride infusion (has no administration in time range)  0.9 %  sodium chloride infusion ( Intravenous Not Given 05/05/19 0513)  dextrose 5 %-0.45 % sodium chloride infusion ( Intravenous Transfusing/Transfer 05/05/19 0457)  insulin regular, human (MYXREDLIN) 100 units/ 100 mL infusion (4.5  Units/hr Intravenous Rate/Dose Change 05/05/19 0450)  enoxaparin (LOVENOX) injection 40 mg (has no administration in time range)  aspirin chewable tablet 81 mg (has no administration in time range)  oxyCODONE (Oxy IR/ROXICODONE) immediate release tablet 15 mg (15 mg Oral Given 05/05/19 0639)  atorvastatin (LIPITOR) tablet 80 mg (has no administration in time range)  carvedilol (COREG) tablet 25 mg (has no administration in time range)  furosemide (LASIX) tablet 40 mg (has no administration in time range)  Icosapent Ethyl CAPS 2 g (has no administration in time range)  isosorbide mononitrate (IMDUR) 24 hr tablet 60 mg (has no administration in time range)  lisinopril (ZESTRIL) tablet 10 mg (has no administration in time range)  traZODone (DESYREL) tablet 50 mg (has no administration in time range)  DULoxetine (CYMBALTA) DR capsule 60 mg (has no administration in time range)  sertraline (ZOLOFT) tablet 50 mg (has no administration in time range)  tiZANidine (ZANAFLEX) tablet 2 mg (has no administration in time range)  potassium chloride SA (K-DUR) CR tablet 40 mEq (has no administration in time range)  albuterol (PROVENTIL) (2.5 MG/3ML) 0.083% nebulizer solution 3 mL (has no administration in time range)  diclofenac  sodium (VOLTAREN) 1 % transdermal gel 2 g (has no administration in time range)  potassium chloride SA (K-DUR) CR tablet 20 mEq (has no administration in time range)  sodium chloride flush (NS) 0.9 % injection 3 mL (3 mLs Intravenous Given 05/04/19 2341)  oxyCODONE (Oxy IR/ROXICODONE) immediate release tablet 15 mg (15 mg Oral Given 05/05/19 0040)     Initial Impression / Assessment and Plan / ED Course  I have reviewed the triage vital signs and the nursing notes.  Pertinent labs & imaging results that were available during my care of the patient were reviewed by me and considered in my medical decision making (see chart for details).  [name] is a 58 y/o male who  presents to the ED with c/o left-sided chest pain, SOB, HA, vision changes. PMHx includes CAD s/p stent placement in 2017, CHF with recent Juan that showed a EF of 60%, T2DM on insulin that is poorly controlled with recent A1c of 9.8. Per chart review, he recently followed up with his cardiologist regarding his significantly elevated triglycerides in the 600s and he was started on Vascepa.   Initial blood work demonstrated a CMP consistent with metabolic acidosis (CO2 18) and elevated anion gap of 17. Glucose elevated at 335. Renal function is WNL with a BUN of 12 and creatinine of 1.15. CBC is WNL with no leukocytosis or anemia. Initial high sensitivity troponin is 5 and second was 7. D-dimer was negative. VBG demonstrated an elevated pH at 7.438 with a decreased CO2 of 37.9.   CT Head did not show any acute intracranial processes. Chest xray was negative for acute cardiopulmonary processes. EKG did not show any acute ST changes.   Patient is having reproducible chest pain with tachycardia. BP is stable. Tachypnea with 90% O2 sat initially but O2sats were 96% during physical exam. Acute CP with SOB differential includes ACS, PE, anxiety. Patient does have hx of thoracic aortic ectasia but xray was negative. Wells criteria score of 3 but with a negative D-dimer, PE is a more unlikely differential. Patient's tachycardia and tachypnea has improved with pain control.  Metabolic acidosis may be secondary to ketosis given patient's initial elevated glucose at 335. Beta-hydroxybutyric acid was elevated as well, supporting possible DKA. At this time, patient will be admitted for DKA. He understands and agrees with this plan.     Final Clinical Impressions(s) / ED Diagnoses   Final diagnoses:  Diabetic ketoacidosis without coma associated with type 2 diabetes mellitus [facility])    ED Discharge Orders    None       Jose Persia, MD 05/05/19 4650    Merrily Pew, MD 05/05/19 2309

## 2019-05-05 ENCOUNTER — Encounter (HOSPITAL_COMMUNITY): Payer: Self-pay | Admitting: Internal Medicine

## 2019-05-05 DIAGNOSIS — E1110 Type 2 diabetes mellitus with ketoacidosis without coma: Secondary | ICD-10-CM | POA: Diagnosis not present

## 2019-05-05 DIAGNOSIS — H538 Other visual disturbances: Secondary | ICD-10-CM | POA: Diagnosis not present

## 2019-05-05 DIAGNOSIS — R079 Chest pain, unspecified: Secondary | ICD-10-CM | POA: Diagnosis not present

## 2019-05-05 DIAGNOSIS — I2510 Atherosclerotic heart disease of native coronary artery without angina pectoris: Secondary | ICD-10-CM

## 2019-05-05 LAB — URINALYSIS, ROUTINE W REFLEX MICROSCOPIC
Bacteria, UA: NONE SEEN
Bilirubin Urine: NEGATIVE
Glucose, UA: >=500 mg/dL — AB
Hgb urine dipstick: NEGATIVE
Ketones, ur: 20 mg/dL — AB
Leukocytes,Ua: NEGATIVE
Nitrite: NEGATIVE
Protein, ur: NEGATIVE mg/dL
Specific Gravity, Urine: 1.033 — ABNORMAL HIGH (ref 1.005–1.030)
pH: 5 (ref 5.0–8.0)

## 2019-05-05 LAB — POCT I-STAT EG7
Acid-Base Excess: 2 mmol/L (ref 0.0–2.0)
Bicarbonate: 25.6 mmol/L (ref 20.0–28.0)
Calcium, Ion: 1.17 mmol/L (ref 1.15–1.40)
HCT: 39 % (ref 39.0–52.0)
Hemoglobin: 13.3 g/dL (ref 13.0–17.0)
O2 Saturation: 92 %
Potassium: 3.8 mmol/L (ref 3.5–5.1)
Sodium: 138 mmol/L (ref 135–145)
TCO2: 27 mmol/L (ref 22–32)
pCO2, Ven: 37.9 mmHg — ABNORMAL LOW (ref 44.0–60.0)
pH, Ven: 7.438 — ABNORMAL HIGH (ref 7.250–7.430)
pO2, Ven: 61 mmHg — ABNORMAL HIGH (ref 32.0–45.0)

## 2019-05-05 LAB — TROPONIN I (HIGH SENSITIVITY)
Troponin I (High Sensitivity): 7 ng/L (ref <18)
Troponin I (High Sensitivity): 7 ng/L (ref <18)
Troponin I (High Sensitivity): 7 ng/L (ref <18)

## 2019-05-05 LAB — BASIC METABOLIC PANEL
Anion gap: 14 (ref 5–15)
Anion gap: 11 (ref 5–15)
Anion gap: 13 (ref 5–15)
Anion gap: 11 (ref 5–15)
Anion gap: 12 (ref 5–15)
BUN: 12 mg/dL (ref 6–20)
BUN: 12 mg/dL (ref 6–20)
BUN: 10 mg/dL (ref 6–20)
BUN: 10 mg/dL (ref 6–20)
BUN: 11 mg/dL (ref 6–20)
CO2: 22 mmol/L (ref 22–32)
CO2: 24 mmol/L (ref 22–32)
CO2: 23 mmol/L (ref 22–32)
CO2: 23 mmol/L (ref 22–32)
CO2: 22 mmol/L (ref 22–32)
Calcium: 9 mg/dL (ref 8.9–10.3)
Calcium: 8.9 mg/dL (ref 8.9–10.3)
Calcium: 9.2 mg/dL (ref 8.9–10.3)
Calcium: 9.1 mg/dL (ref 8.9–10.3)
Calcium: 9.1 mg/dL (ref 8.9–10.3)
Chloride: 100 mmol/L (ref 98–111)
Chloride: 100 mmol/L (ref 98–111)
Chloride: 100 mmol/L (ref 98–111)
Chloride: 100 mmol/L (ref 98–111)
Chloride: 99 mmol/L (ref 98–111)
Creatinine, Ser: 0.84 mg/dL (ref 0.61–1.24)
Creatinine, Ser: 0.66 mg/dL (ref 0.61–1.24)
Creatinine, Ser: 0.69 mg/dL (ref 0.61–1.24)
Creatinine, Ser: 0.7 mg/dL (ref 0.61–1.24)
Creatinine, Ser: 0.73 mg/dL (ref 0.61–1.24)
GFR calc Af Amer: >60 mL/min (ref >60)
GFR calc Af Amer: >60 mL/min (ref >60)
GFR calc Af Amer: >60 mL/min (ref >60)
GFR calc Af Amer: >60 mL/min (ref >60)
GFR calc Af Amer: >60 mL/min (ref >60)
GFR calc non Af Amer: >60 mL/min (ref >60)
GFR calc non Af Amer: >60 mL/min (ref >60)
GFR calc non Af Amer: >60 mL/min (ref >60)
GFR calc non Af Amer: >60 mL/min (ref >60)
GFR calc non Af Amer: >60 mL/min (ref >60)
Glucose, Bld: 229 mg/dL — ABNORMAL HIGH (ref 70–99)
Glucose, Bld: 220 mg/dL — ABNORMAL HIGH (ref 70–99)
Glucose, Bld: 175 mg/dL — ABNORMAL HIGH (ref 70–99)
Glucose, Bld: 260 mg/dL — ABNORMAL HIGH (ref 70–99)
Glucose, Bld: 273 mg/dL — ABNORMAL HIGH (ref 70–99)
Potassium: 4 mmol/L (ref 3.5–5.1)
Potassium: 3.5 mmol/L (ref 3.5–5.1)
Potassium: 4.1 mmol/L (ref 3.5–5.1)
Potassium: 4.3 mmol/L (ref 3.5–5.1)
Potassium: 4.5 mmol/L (ref 3.5–5.1)
Sodium: 136 mmol/L (ref 135–145)
Sodium: 135 mmol/L (ref 135–145)
Sodium: 136 mmol/L (ref 135–145)
Sodium: 134 mmol/L — ABNORMAL LOW (ref 135–145)
Sodium: 133 mmol/L — ABNORMAL LOW (ref 135–145)

## 2019-05-05 LAB — GLUCOSE, CAPILLARY
Glucose-Capillary: 211 mg/dL — ABNORMAL HIGH (ref 70–99)
Glucose-Capillary: 194 mg/dL — ABNORMAL HIGH (ref 70–99)
Glucose-Capillary: 181 mg/dL — ABNORMAL HIGH (ref 70–99)
Glucose-Capillary: 188 mg/dL — ABNORMAL HIGH (ref 70–99)
Glucose-Capillary: 186 mg/dL — ABNORMAL HIGH (ref 70–99)
Glucose-Capillary: 199 mg/dL — ABNORMAL HIGH (ref 70–99)
Glucose-Capillary: 225 mg/dL — ABNORMAL HIGH (ref 70–99)
Glucose-Capillary: 256 mg/dL — ABNORMAL HIGH (ref 70–99)
Glucose-Capillary: 360 mg/dL — ABNORMAL HIGH (ref 70–99)

## 2019-05-05 LAB — HEMOGLOBIN A1C
Hgb A1c MFr Bld: 10.5 % — ABNORMAL HIGH (ref 4.8–5.6)
Mean Plasma Glucose: 254.65 mg/dL

## 2019-05-05 LAB — BETA-HYDROXYBUTYRIC ACID: Beta-Hydroxybutyric Acid: 0.91 mmol/L — ABNORMAL HIGH (ref 0.05–0.27)

## 2019-05-05 LAB — CBG MONITORING, ED
Glucose-Capillary: 226 mg/dL — ABNORMAL HIGH (ref 70–99)
Glucose-Capillary: 215 mg/dL — ABNORMAL HIGH (ref 70–99)
Glucose-Capillary: 226 mg/dL — ABNORMAL HIGH (ref 70–99)

## 2019-05-05 LAB — HIV ANTIBODY (ROUTINE TESTING W REFLEX): HIV Screen 4th Generation wRfx: NONREACTIVE

## 2019-05-05 LAB — CK TOTAL AND CKMB (NOT AT ARMC)
CK, MB: 2.9 ng/mL (ref 0.5–5.0)
Relative Index: INVALID (ref 0.0–2.5)
Total CK: 91 U/L (ref 49–397)

## 2019-05-05 LAB — D-DIMER, QUANTITATIVE: D-Dimer, Quant: 0.3 ug/mL-FEU (ref 0.00–0.50)

## 2019-05-05 MED ORDER — INSULIN ASPART 100 UNIT/ML ~~LOC~~ SOLN
0.0000 [IU] | Freq: Every day | SUBCUTANEOUS | Status: DC
  Administered 2019-05-05: 22:00:00 5 [IU] via SUBCUTANEOUS

## 2019-05-05 MED ORDER — ENOXAPARIN SODIUM 40 MG/0.4ML ~~LOC~~ SOLN
40.0000 mg | SUBCUTANEOUS | Status: DC
  Administered 2019-05-05 – 2019-05-06 (×2): 40 mg via SUBCUTANEOUS
  Filled 2019-05-05 (×3): qty 0.4

## 2019-05-05 MED ORDER — SERTRALINE HCL 50 MG PO TABS
50.0000 mg | ORAL_TABLET | Freq: Every day | ORAL | Status: DC
  Administered 2019-05-05 – 2019-05-06 (×2): 50 mg via ORAL
  Filled 2019-05-05 (×2): qty 1

## 2019-05-05 MED ORDER — DEXTROSE-NACL 5-0.45 % IV SOLN
INTRAVENOUS | Status: DC
  Administered 2019-05-05: 03:00:00 via INTRAVENOUS

## 2019-05-05 MED ORDER — SODIUM CHLORIDE 0.9 % IV SOLN: INTRAVENOUS | Status: DC

## 2019-05-05 MED ORDER — POTASSIUM CHLORIDE CRYS ER 20 MEQ PO TBCR
40.0000 meq | EXTENDED_RELEASE_TABLET | Freq: Every day | ORAL | Status: DC
  Administered 2019-05-05 – 2019-05-06 (×2): 40 meq via ORAL
  Filled 2019-05-05 (×2): qty 2

## 2019-05-05 MED ORDER — POTASSIUM CHLORIDE CRYS ER 20 MEQ PO TBCR
20.0000 meq | EXTENDED_RELEASE_TABLET | ORAL | Status: DC
  Administered 2019-05-05: 20 meq via ORAL
  Filled 2019-05-05: qty 1

## 2019-05-05 MED ORDER — LISINOPRIL 10 MG PO TABS
10.0000 mg | ORAL_TABLET | Freq: Every day | ORAL | Status: DC
  Administered 2019-05-05 – 2019-05-06 (×2): 10 mg via ORAL
  Filled 2019-05-05 (×2): qty 1

## 2019-05-05 MED ORDER — DICLOFENAC SODIUM 1 % TD GEL
2.0000 g | Freq: Four times a day (QID) | TRANSDERMAL | Status: DC | PRN
  Filled 2019-05-05: qty 100

## 2019-05-05 MED ORDER — CARVEDILOL 25 MG PO TABS
25.0000 mg | ORAL_TABLET | Freq: Two times a day (BID) | ORAL | Status: DC
  Administered 2019-05-05 – 2019-05-06 (×2): 25 mg via ORAL
  Filled 2019-05-05 (×2): qty 1

## 2019-05-05 MED ORDER — INSULIN REGULAR(HUMAN) IN NACL 100-0.9 UT/100ML-% IV SOLN
INTRAVENOUS | Status: DC
  Administered 2019-05-05: 1.6 [IU]/h via INTRAVENOUS
  Filled 2019-05-05: qty 100

## 2019-05-05 MED ORDER — OXYCODONE HCL 5 MG PO TABS
15.0000 mg | ORAL_TABLET | Freq: Once | ORAL | Status: AC
  Administered 2019-05-05: 15 mg via ORAL
  Filled 2019-05-05: qty 3

## 2019-05-05 MED ORDER — ICOSAPENT ETHYL 1 G PO CAPS
2.0000 g | ORAL_CAPSULE | Freq: Two times a day (BID) | ORAL | Status: DC
  Administered 2019-05-05 (×2): 2 g via ORAL
  Filled 2019-05-05 (×3): qty 2

## 2019-05-05 MED ORDER — DULOXETINE HCL 60 MG PO CPEP
60.0000 mg | ORAL_CAPSULE | Freq: Every day | ORAL | Status: DC
  Administered 2019-05-05 – 2019-05-06 (×2): 60 mg via ORAL
  Filled 2019-05-05 (×2): qty 1

## 2019-05-05 MED ORDER — INSULIN GLARGINE 100 UNIT/ML ~~LOC~~ SOLN: 40.0000 [IU] | Freq: Every day | SUBCUTANEOUS | Status: DC

## 2019-05-05 MED ORDER — FUROSEMIDE 40 MG PO TABS
40.0000 mg | ORAL_TABLET | Freq: Three times a day (TID) | ORAL | Status: DC
  Administered 2019-05-05 – 2019-05-06 (×4): 40 mg via ORAL
  Filled 2019-05-05 (×4): qty 1

## 2019-05-05 MED ORDER — INSULIN REGULAR(HUMAN) IN NACL 100-0.9 UT/100ML-% IV SOLN: INTRAVENOUS | Status: DC

## 2019-05-05 MED ORDER — INSULIN DETEMIR 100 UNIT/ML ~~LOC~~ SOLN
40.0000 [IU] | Freq: Every day | SUBCUTANEOUS | Status: DC
  Administered 2019-05-05: 12:00:00 40 [IU] via SUBCUTANEOUS
  Filled 2019-05-05 (×2): qty 0.4

## 2019-05-05 MED ORDER — ASPIRIN 81 MG PO CHEW
81.0000 mg | CHEWABLE_TABLET | Freq: Every day | ORAL | Status: DC
  Administered 2019-05-05 – 2019-05-06 (×2): 81 mg via ORAL
  Filled 2019-05-05 (×2): qty 1

## 2019-05-05 MED ORDER — OXYCODONE HCL 5 MG PO TABS
15.0000 mg | ORAL_TABLET | Freq: Three times a day (TID) | ORAL | Status: DC | PRN
  Administered 2019-05-05 – 2019-05-06 (×3): 15 mg via ORAL
  Filled 2019-05-05 (×3): qty 3

## 2019-05-05 MED ORDER — ATORVASTATIN CALCIUM 80 MG PO TABS
80.0000 mg | ORAL_TABLET | Freq: Every day | ORAL | Status: DC
  Administered 2019-05-05 – 2019-05-06 (×2): 80 mg via ORAL
  Filled 2019-05-05 (×2): qty 1

## 2019-05-05 MED ORDER — ISOSORBIDE MONONITRATE ER 60 MG PO TB24
60.0000 mg | ORAL_TABLET | Freq: Every day | ORAL | Status: DC
  Administered 2019-05-05 – 2019-05-06 (×2): 60 mg via ORAL
  Filled 2019-05-05 (×2): qty 1

## 2019-05-05 MED ORDER — TIZANIDINE HCL 4 MG PO TABS: 2.0000 mg | ORAL_TABLET | Freq: Two times a day (BID) | ORAL | Status: DC | PRN

## 2019-05-05 MED ORDER — ALBUTEROL SULFATE (2.5 MG/3ML) 0.083% IN NEBU: 3.0000 mL | INHALATION_SOLUTION | Freq: Four times a day (QID) | RESPIRATORY_TRACT | Status: DC | PRN

## 2019-05-05 MED ORDER — POTASSIUM CHLORIDE 10 MEQ/100ML IV SOLN
10.0000 meq | INTRAVENOUS | Status: DC
  Administered 2019-05-05: 10 meq via INTRAVENOUS
  Filled 2019-05-05: qty 100

## 2019-05-05 MED ORDER — DEXTROSE-NACL 5-0.45 % IV SOLN: INTRAVENOUS | Status: DC

## 2019-05-05 MED ORDER — TRAZODONE HCL 50 MG PO TABS
50.0000 mg | ORAL_TABLET | Freq: Every day | ORAL | Status: DC
  Administered 2019-05-05: 50 mg via ORAL
  Filled 2019-05-05: qty 1

## 2019-05-05 MED ORDER — INSULIN ASPART 100 UNIT/ML ~~LOC~~ SOLN
0.0000 [IU] | Freq: Three times a day (TID) | SUBCUTANEOUS | Status: DC
  Administered 2019-05-05: 12:00:00 5 [IU] via SUBCUTANEOUS
  Administered 2019-05-05: 8 [IU] via SUBCUTANEOUS
  Administered 2019-05-06: 5 [IU] via SUBCUTANEOUS
  Administered 2019-05-06: 12:00:00 8 [IU] via SUBCUTANEOUS

## 2019-05-05 NOTE — H&P (Addendum)
TRH H&P    Patient Demographics:    [name], is a 58 y.o. male  MRN: [id number]  DOB - 1961/06/28  Admit Date - 05/04/2019  Referring MD/NP/PA:  Merrily Pew  Outpatient Primary MD for the patient is [name], FNP  Patient coming from:  home  Chief complaint-  Chest pain,    HPI:    Ryker Sudbury  is a 58 y.o. male,w hypertension, hyperlipidemia, dm2 w neuropathy, CAD s/p DES to LAD in 2017, h/o DVT, h/o Stroke, apparently presents with varied complaints in particular blurred vision and headache starting yesterday afternoon. Pt was concerned about stroke and therefore presented to ED.  Pt had some substernal chest pain but this has been going on since 2016.    In ED,  T 98.2, P 125  R 17, Bp 97/64  Pox 96%   CT brain IMPRESSION: No acute intracranial abnormalities identified.  CXR IMPRESSION: No active cardiopulmonary disease.  Na 137, K 4.4  Bun 12, Creatinine 1.15 AG 17, Hco3 18  Wbc 9.5, Hgb 13.5, Plt 239 Trop 5 INR 0.9  Reviewed prior cardiac catheterizations 12/08/2017  Previously placed Prox LAD to Mid LAD stent (Synergy DES 4.0 mm x 16 mm) is widely patent.  Prox RCA lesion is 20% stenosed.  The left ventricular systolic function is normal. The left ventricular ejection fraction is 55-65% by visual estimate.  LV end diastolic pressure is severely elevated. 27 mmHg  The left ventricular ejection fraction is 55-65% by visual estimate.   Pt will be admitted for  hyperglycemia and DKA , and blurred vision, chest pain.      Review of systems:    In addition to the HPI above,  No Fever-chills, No Headache, No changes with Vision or hearing, No problems swallowing food or Liquids, No Cough or Shortness of Breath, No Abdominal pain, No Nausea or Vomiting, bowel movements are regular, No Blood in stool or Urine, No dysuria, No new skin rashes or bruises, No  new joints pains-aches,  No new weakness, tingling, numbness in any extremity, No recent weight gain or loss, No polyuria, polydypsia or polyphagia, No significant Mental Stressors.  All other systems reviewed and are negative.    Past History of the following :    Past Medical History:  Diagnosis Date   Anemia    Arthritis    Back pain    CAD (coronary artery disease)    a. s/p DES to LAD in 05/2016   Cervical radiculopathy    Chronic diastolic CHF (congestive heart failure) (HCC)    Chronic pain    Depression    DVT (deep venous thrombosis) (HCC)    Hematemesis    Hepatic steatosis    Hyperlipidemia    Hypertension    IBS (irritable bowel syndrome)    Morbid obesity (HCC)    OSA (obstructive sleep apnea)    Pancreatitis    PE (pulmonary thromboembolism) ([place])    Peripheral neuropathy    PUD (peptic ulcer disease)    Renal disorder  Stroke [facility])    a. ?details unclear - not seen on imaging when he was admitted in 05/2017 for TIA symptoms which were felt due to cervical radiculopathy.   Thoracic aortic ectasia (HCC)    a. 4.3cm ectatic ascending thoracic aorta by CT 06/2017.    Type 2 diabetes mellitus ([place])       Past Surgical History:  Procedure Laterality Date   CARDIAC CATHETERIZATION N/A 05/31/2016   Procedure: Left Heart Cath and Coronary Angiography;  Surgeon: Peter M Martinique, MD;  Location: [place] [facility];  Service: Cardiovascular;  Laterality: N/A;   CARDIAC CATHETERIZATION N/A 05/31/2016   Procedure: Intravascular Pressure Wire/FFR Study;  Surgeon: Peter M Martinique, MD;  Location: [facility];  Service: Cardiovascular;  Laterality: N/A;   CARDIAC CATHETERIZATION N/A 05/31/2016   Procedure: Coronary Stent Intervention;  Surgeon: Peter M Martinique, MD;  Location: [place] [facility];  Service: Cardiovascular;  Laterality: N/A;   LEFT HEART CATH AND CORONARY ANGIOGRAPHY N/A 12/08/2017   Procedure: LEFT HEART CATH AND CORONARY  ANGIOGRAPHY;  Surgeon: Leonie Man, MD;  Location: [facility] [facility];  Service: Cardiovascular;  Laterality: N/A;   LEFT HEART CATHETERIZATION WITH CORONARY ANGIOGRAM N/A 02/03/2014   Procedure: LEFT HEART CATHETERIZATION WITH CORONARY ANGIOGRAM;  Surgeon: Pixie Casino, MD;  Location: [facility] CATH LAB;  Service: Cardiovascular;  Laterality: N/A;   left leg stent         Social History:      Social History   Tobacco Use   Smoking status: Never Smoker   Smokeless tobacco: Never Used  Substance Use Topics   Alcohol use: No       Family History :     Family History  Problem Relation Age of Onset   Cancer Father    Hypertension Mother    Diabetes Mother    Breast cancer Mother    Hypertension Brother    Diabetes Brother    Hypertension Sister    Diabetes Sister        Home Medications:   Prior to Admission medications   Medication Sig Start Date End Date Taking? Authorizing Provider  Albuterol Sulfate (PROAIR RESPICLICK) 751 (90 Base) MCG/ACT AEPB Inhale 2 puffs into the lungs every 6 (six) hours as needed. Patient taking differently: Inhale 2 puffs into the lungs every 6 (six) hours as needed (for breathing).  11/06/18  Yes Robyn Haber, MD  aspirin 81 MG chewable tablet Chew 1 tablet (81 mg total) by mouth daily. 06/01/16  Yes Elwin Mocha, MD  atorvastatin (LIPITOR) 80 MG tablet Take 1 tablet (80 mg total) by mouth daily. 01/26/17  Yes Strader, Fransisco Hertz, PA-C  carvedilol (COREG) 25 MG tablet Take 1 tablet (25 mg total) by mouth 2 (two) times daily with a meal. 01/26/17  Yes Strader, [place] M, PA-C  diclofenac (FLECTOR) 1.3 % PTCH Place 1 patch onto the skin daily as needed (for pain).  12/31/18  Yes [provider]  diclofenac sodium (VOLTAREN) 1 % GEL Apply 1 application topically 4 (four) times daily. Patient taking differently: Apply 1 application topically 4 (four) times daily as needed (for pain).  04/05/18  Yes Melynda Ripple, MD    DULoxetine (CYMBALTA) 60 MG capsule TAKE 1 CAPSULE BY MOUTH EVERY DAY Patient taking differently: Take 60 mg by mouth daily.  12/24/18  Yes Marcial Pacas, MD  FARXIGA 10 MG TABS tablet Take 10 mg by mouth daily. 01/25/19  Yes [provider]  furosemide (  LASIX) 40 MG tablet Take 40 mg by mouth 3 (three) times daily.   Yes [provider]  glipiZIDE (GLUCOTROL) 10 MG tablet Take 10 mg by mouth 2 (two) times daily. 05/30/17  Yes [provider]  Icosapent Ethyl (VASCEPA) 1 g CAPS Take 2 capsules (2 g total) by mouth 2 (two) times a day. 04/24/19  Yes Hilty, Nadean Corwin, MD  Insulin Detemir (LEVEMIR FLEXTOUCH) 100 UNIT/ML Pen Inject 55 Units into the skin daily. 01/25/19  Yes [provider]  isosorbide mononitrate (IMDUR) 60 MG 24 hr tablet Take 1 tablet (60 mg total) by mouth daily. 02/06/19  Yes Skeet Latch, MD  lisinopril (PRINIVIL,ZESTRIL) 10 MG tablet Take 1 tablet (10 mg total) by mouth daily. 12/10/17  Yes Velvet Bathe, MD  metFORMIN (GLUCOPHAGE-XR) 500 MG 24 hr tablet Take 2 tablets (1,000 mg total) by mouth 2 (two) times daily. 07/02/17  Yes Barton Dubois, MD  nitroGLYCERIN (NITROSTAT) 0.4 MG SL tablet Place 1 tablet (0.4 mg total) under the tongue every 5 (five) minutes as needed for chest pain. 02/06/19  Yes Skeet Latch, MD  oxyCODONE (ROXICODONE) 15 MG immediate release tablet Take 15 mg 3 (three) times daily as needed by mouth for pain.  08/28/17  Yes [provider]  potassium chloride SA (K-DUR,KLOR-CON) 20 MEQ tablet Take 20-40 mEq by mouth See admin instructions. Take 2 tablets at lunch then take 1 tablet at dinner and at bedtime   Yes [provider]  sertraline (ZOLOFT) 50 MG tablet Take 50 mg by mouth daily.   Yes [provider]  tiZANidine (ZANAFLEX) 2 MG tablet Take 2 mg by mouth 2 (two) times daily as needed for pain.   Yes [provider]  traZODone (DESYREL) 50 MG tablet Take 1 tablet (50 mg total) by mouth at  bedtime. 12/15/16  Yes Burns, Arloa Koh, MD  famotidine (PEPCID) 40 MG tablet Take 1 tablet (40 mg total) by mouth at bedtime. Patient not taking: Reported on 05/05/2019 06/18/17   Barton Dubois, MD  Insulin Pen Needle 31G X 5 MM MISC Use 1 needle daily to inject insulin as prescribed 06/18/17   Barton Dubois, MD     Allergies:     Allergies  Allergen Reactions   Coconut Flavor [Flavoring Agent] Hives   Coconut Oil Hives   Ibuprofen Other (See Comments)    Made gastric ulcers worse   Aleve [Naproxen] Other (See Comments)    DUE TO KIDNEYS   Nsaids Other (See Comments)    Stomach ulcers      Physical Exam:   Vitals  Blood pressure (!) 142/90, pulse 89, temperature 98.2 F (36.8 C), temperature source Oral, resp. rate 18, SpO2 96 %.  1.  General: axox3  2. Psychiatric: euthymic  3. Neurologic: cn2-12 intact, reflexes 2+ symmetric, diffuse with no clonus, motor 5/5 in all 4 ext   4. HEENMT:  Anicteric, pupils 1.76mm symmetric, direct, consensual , near intact Neck: no jvd, no bruit  5. Respiratory : CTAB  6. Cardiovascular : rrr s1, s2, no m/g/r  7. Gastrointestinal:  Abd: soft, obese, nt, nd, +bs  8. Skin:  Ext: no c/c/e,  No rash  9.Musculoskeletal:  Good ROM  No adenopathy    Data Review:    CBC Recent Labs  Lab 05/04/19 2037 05/05/19 0125  WBC 9.5  --   HGB 13.5 13.3  HCT 41.1 39.0  PLT 239  --   MCV 91.9  --   MCH 30.2  --  MCHC 32.8  --   RDW 15.0  --   LYMPHSABS 2.9  --   MONOABS 0.8  --   EOSABS 0.1  --   BASOSABS 0.0  --    ------------------------------------------------------------------------------------------------------------------  Results for orders placed or performed during the hospital encounter of 05/04/19 (from the past 48 hour(s))  Basic metabolic panel     Status: Abnormal   Collection Time: 05/04/19  8:37 PM  Result Value Ref Range   Sodium 137 135 - 145 mmol/L    Comment: POST-ULTRACENTRIFUGATION LIPEMIC  SPECIMEN    Potassium 4.4 3.5 - 5.1 mmol/L    Comment: SLIGHT HEMOLYSIS   Chloride 102 98 - 111 mmol/L   CO2 18 (L) 22 - 32 mmol/L   Glucose, Bld 335 (H) 70 - 99 mg/dL   BUN 12 6 - 20 mg/dL   Creatinine, Ser 1.15 0.61 - 1.24 mg/dL   Calcium 9.6 8.9 - 10.3 mg/dL   GFR calc non Af Amer >60 >60 mL/min   GFR calc Af Amer >60 >60 mL/min   Anion gap 17 (H) 5 - 15    Comment: Performed at [facility], [address] [place] [address]  CBC     Status: None   Collection Time: 05/04/19  8:37 PM  Result Value Ref Range   WBC 9.5 4.0 - 10.5 K/uL   RBC 4.47 4.22 - 5.81 MIL/uL   Hemoglobin 13.5 13.0 - 17.0 g/dL   HCT 41.1 39.0 - 52.0 %   MCV 91.9 80.0 - 100.0 fL   MCH 30.2 26.0 - 34.0 pg   MCHC 32.8 30.0 - 36.0 g/dL   RDW 15.0 11.5 - 15.5 %   Platelets 239 150 - 400 K/uL   nRBC 0.0 0.0 - 0.2 %    Comment: Performed at [facility], [place] [address] [place], [place] [address]  Troponin I (High Sensitivity)     Status: None   Collection Time: 05/04/19  8:37 PM  Result Value Ref Range   Troponin I (High Sensitivity) 5 <18 ng/L    Comment: (NOTE) Elevated high sensitivity troponin I (hsTnI) values and significant  changes across serial measurements may suggest ACS but many other  chronic and acute conditions are known to elevate hsTnI results.  Refer to the Links section for chest pain algorithms and additional  guidance. Performed at [facility], [place] [address], [place], [place] [address]   Protime-INR     Status: None   Collection Time: 05/04/19  8:37 PM  Result Value Ref Range   Prothrombin Time 12.3 11.4 - 15.2 seconds   INR 0.9 0.8 - 1.2    Comment: (NOTE) INR goal varies based on device and disease states. Performed at [facility], [place] [address] [place], [place] [address]   APTT     Status: None   Collection Time: 05/04/19  8:37 PM  Result Value Ref Range   aPTT 28 24 - 36 seconds    Comment: Performed at [place] [address] [place], [place] [address]  Differential     Status: None   Collection Time: 05/04/19  8:37 PM  Result Value Ref Range   Neutrophils Relative % 60 %   Neutro Abs 5.7 1.7 - 7.7 K/uL   Lymphocytes Relative 31 %   Lymphs Abs 2.9 0.7 - 4.0 K/uL   Monocytes Relative 8 %   Monocytes Absolute 0.8 0.1 - 1.0  K/uL   Eosinophils Relative 1 %   Eosinophils Absolute 0.1 0.0 - 0.5 K/uL   Basophils Relative 0 %   Basophils Absolute 0.0 0.0 - 0.1 K/uL   Immature Granulocytes 0 %   Abs Immature Granulocytes 0.02 0.00 - 0.07 K/uL    Comment: Performed at [facility], [place] [address], [place], [place] [address]  Troponin I (High Sensitivity)     Status: None   Collection Time: 05/04/19 11:22 PM  Result Value Ref Range   Troponin I (High Sensitivity) 7 <18 ng/L    Comment: (NOTE) Elevated high sensitivity troponin I (hsTnI) values and significant  changes across serial measurements may suggest ACS but many other  chronic and acute conditions are known to elevate hsTnI results.  Refer to the "Links" section for chest pain algorithms and additional  guidance. Performed at [facility], [place] [address] [place], [place] [address]   D-dimer, quantitative (not at [facility])     Status: None   Collection Time: 05/05/19  1:20 AM  Result Value Ref Range   D-Dimer, Quant 0.30 0.00 - 0.50 ug/mL-FEU    Comment: (NOTE) At the manufacturer cut-off of 0.50 ug/mL FEU, this assay has been documented to exclude PE with a sensitivity and negative predictive value of 97 to 99%.  At this time, this assay has not been approved by the FDA to exclude DVT/VTE. Results should be correlated with clinical presentation. Performed at [facility], [place] [address] [place], [place] [address]   Beta-hydroxybutyric acid     Status: Abnormal   Collection Time: 05/05/19  1:20 AM  Result Value Ref Range   Beta-Hydroxybutyric Acid 0.91 (H) 0.05 - 0.27 mmol/L    Comment: Performed at [facility] [address] [place], [place] [address]  CBG monitoring, ED     Status: Abnormal   Collection Time: 05/05/19  1:24 AM  Result Value Ref Range   Glucose-Capillary 226 (H) 70 - 99 mg/dL  POCT I-Stat EG7     Status: Abnormal   Collection Time: 05/05/19  1:25 AM  Result Value Ref Range   pH, Ven 7.438 (H) 7.250 - 7.430   pCO2, Ven 37.9 (L) 44.0 - 60.0 mmHg   pO2, Ven 61.0 (H) 32.0 - 45.0 mmHg   Bicarbonate 25.6 20.0 - 28.0 mmol/L   TCO2 27 22 - 32 mmol/L   O2 Saturation 92.0 %   Acid-Base Excess 2.0 0.0 - 2.0 mmol/L   Sodium 138 135 - 145 mmol/L   Potassium 3.8 3.5 - 5.1 mmol/L   Calcium, Ion 1.17 1.15 - 1.40 mmol/L   HCT 39.0 39.0 - 52.0 %   Hemoglobin 13.3 13.0 - 17.0 g/dL   Patient temperature HIDE    Sample type VENOUS   Urinalysis, Routine w reflex microscopic     Status: Abnormal   Collection Time: 05/05/19  2:30 AM  Result Value Ref Range   Color, Urine YELLOW YELLOW   APPearance CLEAR CLEAR   Specific Gravity, Urine 1.033 (H) 1.005 - 1.030   pH 5.0 5.0 - 8.0   Glucose, UA >=500 (A) NEGATIVE mg/dL   Hgb urine dipstick NEGATIVE NEGATIVE   Bilirubin Urine NEGATIVE NEGATIVE   Ketones, ur 20 (A) NEGATIVE mg/dL   Protein, ur NEGATIVE NEGATIVE mg/dL   Nitrite NEGATIVE NEGATIVE   Leukocytes,Ua NEGATIVE NEGATIVE   RBC / HPF 0-5 0 - 5 RBC/hpf   WBC, UA 0-5 0 - 5 WBC/hpf  Bacteria, UA NONE SEEN NONE SEEN   Squamous Epithelial / LPF 0-5 0 - 5    Comment: Performed at [facility], [place] [address] [place], [place] [address]  CBG monitoring, ED     Status: Abnormal   Collection Time: 05/05/19  2:41 AM  Result Value Ref Range   Glucose-Capillary 215 (H) 70 - 99 mg/dL  Hemoglobin A1c     Status: Abnormal   Collection Time: 05/05/19  3:34 AM  Result Value Ref Range   Hgb A1c MFr Bld 10.5 (H) 4.8 - 5.6 %    Comment: (NOTE) Pre diabetes:          5.7%-6.4% Diabetes:              >6.4% Glycemic control for   <7.0% adults with diabetes    Mean Plasma Glucose 254.65 mg/dL     Comment: Performed at [facility] [address], [place], [place] [address]  CBG monitoring, ED     Status: Abnormal   Collection Time: 05/05/19  3:44 AM  Result Value Ref Range   Glucose-Capillary 226 (H) 70 - 99 mg/dL    Chemistries  Recent Labs  Lab 05/04/19 2037 05/05/19 0125  NA 137 138  K 4.4 3.8  CL 102  --   CO2 18*  --   GLUCOSE 335*  --   BUN 12  --   CREATININE 1.15  --   CALCIUM 9.6  --    ------------------------------------------------------------------------------------------------------------------  ------------------------------------------------------------------------------------------------------------------ GFR: Estimated Creatinine Clearance: 100.1 mL/min (by C-G formula based on SCr of 1.15 mg/dL). Liver Function Tests: No results for input(s): AST, ALT, ALKPHOS, BILITOT, PROT, ALBUMIN in the last 168 hours. No results for input(s): LIPASE, AMYLASE in the last 168 hours. No results for input(s): AMMONIA in the last 168 hours. Coagulation Profile: Recent Labs  Lab 05/04/19 2037  INR 0.9   Cardiac Enzymes: No results for input(s): CKTOTAL, CKMB, CKMBINDEX, TROPONINI in the last 168 hours. BNP (last 3 results) No results for input(s): PROBNP in the last 8760 hours. HbA1C: Recent Labs    05/05/19 0334  HGBA1C 10.5*   CBG: Recent Labs  Lab 05/05/19 0124 05/05/19 0241 05/05/19 0344  GLUCAP 226* 215* 226*   Lipid Profile: No results for input(s): CHOL, HDL, LDLCALC, TRIG, CHOLHDL, LDLDIRECT in the last 72 hours. Thyroid Function Tests: No results for input(s): TSH, T4TOTAL, FREET4, T3FREE, THYROIDAB in the last 72 hours. Anemia Panel: No results for input(s): VITAMINB12, FOLATE, FERRITIN, TIBC, IRON, RETICCTPCT in the last 72 hours.  --------------------------------------------------------------------------------------------------------------- Urine analysis:    Component Value Date/Time   COLORURINE YELLOW 05/05/2019 0230    APPEARANCEUR CLEAR 05/05/2019 0230   LABSPEC 1.033 (H) 05/05/2019 0230   PHURINE 5.0 05/05/2019 0230   GLUCOSEU >=500 (A) 05/05/2019 0230   HGBUR NEGATIVE 05/05/2019 0230   BILIRUBINUR NEGATIVE 05/05/2019 0230   KETONESUR 20 (A) 05/05/2019 0230   PROTEINUR NEGATIVE 05/05/2019 0230   UROBILINOGEN 0.2 05/27/2014 2312   NITRITE NEGATIVE 05/05/2019 0230   LEUKOCYTESUR NEGATIVE 05/05/2019 0230      Imaging Results:    Dg Chest 2 View  Result Date: 05/04/2019 CLINICAL DATA:  Acute onset left chest pain radiating to jaw 1 hour ago. Coronary artery disease. Diabetes. EXAM: CHEST - 2 VIEW COMPARISON:  11/07/2018 FINDINGS: The heart size and mediastinal contours are within normal limits. Both lungs are clear. Incidental note is made of diffuse idiopathic skeletal hyperostosis involving the thoracic spine. IMPRESSION: No active cardiopulmonary  disease. Electronically Signed   By: Marlaine Hind M.D.   On: 05/04/2019 20:50   Ct Head Wo Contrast  Result Date: 05/04/2019 CLINICAL DATA:  Left-sided chest pain.  Headache and double vision. EXAM: CT HEAD WITHOUT CONTRAST TECHNIQUE: Contiguous axial images were obtained from the base of the skull through the vertex without intravenous contrast. COMPARISON:  July 03, 2018 FINDINGS: Brain: No evidence of acute infarction, hemorrhage, hydrocephalus, extra-axial collection or mass lesion/mass effect. Vascular: Calcified atherosclerosis is seen in the intracranial carotids. Skull: Normal. Negative for fracture or focal lesion. Sinuses/Orbits: No acute finding. Other: None. IMPRESSION: No acute intracranial abnormalities identified. Electronically Signed   By: Dorise Bullion III M.D   On: 05/04/2019 21:15     ekg st at 125, nl axis, nl int, t inversion in v6    Assessment & Plan:    Principal Problem:   DKA (diabetic ketoacidoses) ([place]) Active Problems:   Nonspecific chest pain   Dyslipidemia   HTN (hypertension)   CAD (coronary artery disease)    Blurred vision  Hyperglycemia w DKA NPO NS iv Insulin iv Bmp q4h x5  Blurred vision likely secondary to hyperglycemia Check MRI brain r/o CVA Lower sugar as above  Chest pain Prior cardiac catheterization 2019-> no significant lesions Trop I  CAD s/p DES to LAD 2017 Cont Aspirin 81mg  po qday Cont Lipitor 80mg  po qhs Cont carvedilol 25mg  po bid Cont Vascepa  Cont Imdur 60mg  po qday Cont Lisinopril 10mg  po qday Cont Lasix 40mg  po tid  Dm2 Hold Metformin Hold Glipizide Hold Farxiga  (note that this can be  Hold Levemir Insulin iv as above  Anxiety Cont Zoloft 50mg  po qday Cont Duloxetine 60mg  po qday Cont Trazodone  DVT Prophylaxis-    Lovenox,  SCDs   AM Labs Ordered, also please review Full Orders  Family Communication: Admission, patients condition and plan of care including tests being ordered have been discussed with the patient  who indicate understanding and agree with the plan and Code Status.  Code Status:  FULL CODE, attempted call to  sister to let her know patient admitted,  No response, left message  Admission status: Observation: Based on patients clinical presentation and evaluation of above clinical data, I have made determination that patient meets observation criteria at this time.    Time spent in minutes : 70   Jani Gravel M.D on 05/05/2019 at 3:56 AM

## 2019-05-05 NOTE — ED Notes (Signed)
Patient transported to MRI 

## 2019-05-05 NOTE — Progress Notes (Addendum)
Please H&P from this morning for full details. 58 y/o m with DM , TN, hyperlipidemia,DVT, CVA, CAD, S/O DES to LAD in 2017, chronic angina presents with chest pain associated with headache/blurred vision which felt like symptoms during old stroke. Noted to have BG 461 by EMS and Glucose elevated at 335 with HCO3 18 on labs here in ED. Renal function is WNL with a BUN of 12 and creatinine of 1.15. CBC is WNL with no leukocytosis or anemia. Initial high sensitivity troponin is 5 and second was 7. D-dimer was negative. VBG demonstrated an elevated pH at 7.438 with a decreased CO2 of 37.9. CT Head did not show any acute intracranial processes. Chest xray was negative for acute cardiopulmonary processes. EKG did not show any acute ST changes.    Admitted with DKA , insulin drip. MRI head pending to r/o CVA. He underwent recent echo that showed EF of 60%. Per chart review, he recently followed up with his cardiologist regarding his significantly elevated triglycerides in the 600s and he was started on Vascepa. He states he takes 55 units of Levemir along with oral hypoglycemics at home and is semi strick with diabetic diet. His last HgbAIc was 9.8. Labs this morning show improved Hco3 and BG levels. Will resume Levemir at 40 units and d/c insulin drip an hour later. Awaiting MRI results. He feels better in terms of chest pain/blurred vision.Can be downgraded to telemetry status once off insulin drip . Ok to eat. Anticipate d/c in am if BG well controlled and stroke w/u -ve

## 2019-05-05 NOTE — ED Notes (Signed)
ED TO INPATIENT HANDOFF REPORT  ED Nurse Name and Phone #: [phone] Threasa Beards, RN  S Name/Age/Gender [name] 58 y.o. male Room/Bed: 021C/021C  Code Status   Code Status: Full Code  Home/SNF/Other Home Patient oriented to: self, place, time and situation Is this baseline? Yes   Triage Complete: Triage complete  Chief Complaint cp  Triage Note Per EMS pt called to report feelings like his past TIAs. Endorses left-sided chest pain non radiating, headache, double vision, and states he normally is hypertensive. CHF hx. Swelling in abdomen per pt. VSS 124/84  95%, HR 120  Given 324 ASA no nitro. 20 g L hand  Pt;'s neuro exam intact in triage. NIH 0   Allergies Allergies  Allergen Reactions  . Coconut Flavor [Flavoring Agent] Hives  . Coconut Oil Hives  . Ibuprofen Other (See Comments)    Made gastric ulcers worse  . Aleve [Naproxen] Other (See Comments)    DUE TO KIDNEYS  . Nsaids Other (See Comments)    Stomach ulcers     Level of Care/Admitting Diagnosis ED Disposition    ED Disposition Condition Comment   Admit  Hospital Area: [facility] [[id number]]  Level of Care: Progressive [102]  I expect the patient will be discharged within 24 hours: No (not a candidate for 5C-Observation unit)  Covid Evaluation: Asymptomatic Screening Protocol (No Symptoms)  Diagnosis: DKA (diabetic ketoacidoses) [facility]) [588502]  Admitting Physician: Jani Gravel [[id number]]  Attending Physician: Jani Gravel [[id number]]  PT Class (Do Not Modify): Observation [104]  PT Acc Code (Do Not Modify): Observation [10022]       B Medical/Surgery History Past Medical History:  Diagnosis Date  . Anemia   . Arthritis   . Back pain   . CAD (coronary artery disease)    a. s/p DES to LAD in 05/2016  . Cervical radiculopathy   . Chronic diastolic CHF (congestive heart failure) ([place])   . Chronic pain   . Depression   . DVT (deep venous thrombosis) ([place])   . Hematemesis   . Hepatic  steatosis   . Hyperlipidemia   . Hypertension   . IBS (irritable bowel syndrome)   . Morbid obesity ([place])   . OSA (obstructive sleep apnea)   . Pancreatitis   . PE (pulmonary thromboembolism) ([place])   . Peripheral neuropathy   . PUD (peptic ulcer disease)   . Renal disorder   . Stroke [facility])    a. ?details unclear - not seen on imaging when he was admitted in 05/2017 for TIA symptoms which were felt due to cervical radiculopathy.  . Thoracic aortic ectasia ([facility])    a. 4.3cm ectatic ascending thoracic aorta by CT 06/2017.   . Type 2 diabetes mellitus ([place])    Past Surgical History:  Procedure Laterality Date  . CARDIAC CATHETERIZATION N/A 05/31/2016   Procedure: Left Heart Cath and Coronary Angiography;  Surgeon: Peter M Martinique, MD;  Location: [facility] CV LAB;  Service: Cardiovascular;  Laterality: N/A;  . CARDIAC CATHETERIZATION N/A 05/31/2016   Procedure: Intravascular Pressure Wire/FFR Study;  Surgeon: Peter M Martinique, MD;  Location: [facility];  Service: Cardiovascular;  Laterality: N/A;  . CARDIAC CATHETERIZATION N/A 05/31/2016   Procedure: Coronary Stent Intervention;  Surgeon: Peter M Martinique, MD;  Location: [facility] CV [facility];  Service: Cardiovascular;  Laterality: N/A;  . LEFT HEART CATH AND CORONARY ANGIOGRAPHY N/A 12/08/2017   Procedure: LEFT HEART CATH AND CORONARY ANGIOGRAPHY;  Surgeon: Leonie Man,  MD;  Location: [facility];  Service: Cardiovascular;  Laterality: N/A;  . LEFT HEART CATHETERIZATION WITH CORONARY ANGIOGRAM N/A 02/03/2014   Procedure: LEFT HEART CATHETERIZATION WITH CORONARY ANGIOGRAM;  Surgeon: Pixie Casino, MD;  Location: [facility] CATH LAB;  Service: Cardiovascular;  Laterality: N/A;  . left leg stent        A IV Location/Drains/Wounds Patient Lines/Drains/Airways Status   Active Line/Drains/Airways    Name:   Placement date:   Placement time:   Site:   Days:   Peripheral IV 05/04/19 Left Forearm   05/04/19    2332    Forearm   1           Intake/Output Last 24 hours No intake or output data in the 24 hours ending 05/05/19 0354  Labs/Imaging Results for orders placed or performed during the hospital encounter of 05/04/19 (from the past 48 hour(s))  Basic metabolic panel     Status: Abnormal   Collection Time: 05/04/19  8:37 PM  Result Value Ref Range   Sodium 137 135 - 145 mmol/L    Comment: POST-ULTRACENTRIFUGATION LIPEMIC SPECIMEN    Potassium 4.4 3.5 - 5.1 mmol/L    Comment: SLIGHT HEMOLYSIS   Chloride 102 98 - 111 mmol/L   CO2 18 (L) 22 - 32 mmol/L   Glucose, Bld 335 (H) 70 - 99 mg/dL   BUN 12 6 - 20 mg/dL   Creatinine, Ser 1.15 0.61 - 1.24 mg/dL   Calcium 9.6 8.9 - 10.3 mg/dL   GFR calc non Af Amer >60 >60 mL/min   GFR calc Af Amer >60 >60 mL/min   Anion gap 17 (H) 5 - 15    Comment: Performed at [facility], [address] [place] [address]  CBC     Status: None   Collection Time: 05/04/19  8:37 PM  Result Value Ref Range   WBC 9.5 4.0 - 10.5 K/uL   RBC 4.47 4.22 - 5.81 MIL/uL   Hemoglobin 13.5 13.0 - 17.0 g/dL   HCT 41.1 39.0 - 52.0 %   MCV 91.9 80.0 - 100.0 fL   MCH 30.2 26.0 - 34.0 pg   MCHC 32.8 30.0 - 36.0 g/dL   RDW 15.0 11.5 - 15.5 %   Platelets 239 150 - 400 K/uL   nRBC 0.0 0.0 - 0.2 %    Comment: Performed at [facility], [place] [address], [place], [place] [address]  Troponin I (High Sensitivity)     Status: None   Collection Time: 05/04/19  8:37 PM  Result Value Ref Range   Troponin I (High Sensitivity) 5 <18 ng/L    Comment: (NOTE) Elevated high sensitivity troponin I (hsTnI) values and significant  changes across serial measurements may suggest ACS but many other  chronic and acute conditions are known to elevate hsTnI results.  Refer to the Links section for chest pain algorithms and additional  guidance. Performed at [facility], [place] [address] [place], [place] [address]   Protime-INR     Status: None   Collection Time: 05/04/19  8:37 PM   Result Value Ref Range   Prothrombin Time 12.3 11.4 - 15.2 seconds   INR 0.9 0.8 - 1.2    Comment: (NOTE) INR goal varies based on device and disease states. Performed at [facility], [place] [address] [place], [place] [address]   APTT     Status: None   Collection Time: 05/04/19  8:37 PM  Result Value Ref Range   aPTT 28 24 - 36 seconds    Comment: Performed at [facility], [place] [address], [place], [place] [address]  Differential     Status: None   Collection Time: 05/04/19  8:37 PM  Result Value Ref Range   Neutrophils Relative % 60 %   Neutro Abs 5.7 1.7 - 7.7 K/uL   Lymphocytes Relative 31 %   Lymphs Abs 2.9 0.7 - 4.0 K/uL   Monocytes Relative 8 %   Monocytes Absolute 0.8 0.1 - 1.0 K/uL   Eosinophils Relative 1 %   Eosinophils Absolute 0.1 0.0 - 0.5 K/uL   Basophils Relative 0 %   Basophils Absolute 0.0 0.0 - 0.1 K/uL   Immature Granulocytes 0 %   Abs Immature Granulocytes 0.02 0.00 - 0.07 K/uL    Comment: Performed at [facility], [place] [address], [place], [place] [address]  Troponin I (High Sensitivity)     Status: None   Collection Time: 05/04/19 11:22 PM  Result Value Ref Range   Troponin I (High Sensitivity) 7 <18 ng/L    Comment: (NOTE) Elevated high sensitivity troponin I (hsTnI) values and significant  changes across serial measurements may suggest ACS but many other  chronic and acute conditions are known to elevate hsTnI results.  Refer to the "Links" section for chest pain algorithms and additional  guidance. Performed at [facility], [place] [address] [place], [place] [address]   D-dimer, quantitative (not at [facility])     Status: None   Collection Time: 05/05/19  1:20 AM  Result Value Ref Range   D-Dimer, Quant 0.30 0.00 - 0.50 ug/mL-FEU    Comment: (NOTE) At the manufacturer cut-off of 0.50 ug/mL FEU, this assay has been documented to exclude PE with a sensitivity and negative predictive value of 97 to 99%.  At this time, this  assay has not been approved by the FDA to exclude DVT/VTE. Results should be correlated with clinical presentation. Performed at [facility], [place] [address], [place], [place] [address]   Beta-hydroxybutyric acid     Status: Abnormal   Collection Time: 05/05/19  1:20 AM  Result Value Ref Range   Beta-Hydroxybutyric Acid 0.91 (H) 0.05 - 0.27 mmol/L    Comment: Performed at [facility] [address], [place],  [address]  CBG monitoring, ED     Status: Abnormal   Collection Time: 05/05/19  1:24 AM  Result Value Ref Range   Glucose-Capillary 226 (H) 70 - 99 mg/dL  POCT I-Stat EG7     Status: Abnormal   Collection Time: 05/05/19  1:25 AM  Result Value Ref Range   pH, Ven 7.438 (H) 7.250 - 7.430   pCO2, Ven 37.9 (L) 44.0 - 60.0 mmHg   pO2, Ven 61.0 (H) 32.0 - 45.0 mmHg   Bicarbonate 25.6 20.0 - 28.0 mmol/L   TCO2 27 22 - 32 mmol/L   O2 Saturation 92.0 %   Acid-Base Excess 2.0 0.0 - 2.0 mmol/L   Sodium 138 135 - 145 mmol/L   Potassium 3.8 3.5 - 5.1 mmol/L   Calcium, Ion 1.17 1.15 - 1.40 mmol/L   HCT 39.0 39.0 - 52.0 %   Hemoglobin 13.3 13.0 - 17.0 g/dL   Patient temperature HIDE    Sample type VENOUS   Urinalysis, Routine w reflex microscopic     Status: Abnormal   Collection Time: 05/05/19  2:30 AM  Result Value Ref Range   Color, Urine YELLOW YELLOW   APPearance CLEAR CLEAR   Specific Gravity, Urine 1.033 (H) 1.005 - 1.030   pH 5.0 5.0 - 8.0   Glucose, UA >=500 (A) NEGATIVE mg/dL   Hgb urine dipstick NEGATIVE NEGATIVE   Bilirubin Urine NEGATIVE NEGATIVE   Ketones, ur 20 (A) NEGATIVE mg/dL   Protein, ur NEGATIVE NEGATIVE mg/dL   Nitrite NEGATIVE NEGATIVE   Leukocytes,Ua NEGATIVE NEGATIVE   RBC / HPF 0-5 0 - 5 RBC/hpf   WBC, UA 0-5 0 - 5 WBC/hpf   Bacteria, UA NONE SEEN NONE SEEN   Squamous Epithelial / LPF 0-5 0 - 5    Comment: Performed at [facility], [place] [address], [place], [place] [address]  CBG monitoring, ED     Status: Abnormal    Collection Time: 05/05/19  2:41 AM  Result Value Ref Range   Glucose-Capillary 215 (H) 70 - 99 mg/dL  Hemoglobin A1c     Status: Abnormal   Collection Time: 05/05/19  3:34 AM  Result Value Ref Range   Hgb A1c MFr Bld 10.5 (H) 4.8 - 5.6 %    Comment: (NOTE) Pre diabetes:          5.7%-6.4% Diabetes:              >6.4% Glycemic control for   <7.0% adults with diabetes    Mean Plasma Glucose 254.65 mg/dL    Comment: Performed at [facility] [address] [place], [place] [address]  CBG monitoring, ED     Status: Abnormal   Collection Time: 05/05/19  3:44 AM  Result Value Ref Range   Glucose-Capillary 226 (H) 70 - 99 mg/dL   Dg Chest 2 View  Result Date: 05/04/2019 CLINICAL DATA:  Acute onset left chest pain radiating to jaw 1 hour ago. Coronary artery disease. Diabetes. EXAM: CHEST - 2 VIEW COMPARISON:  11/07/2018 FINDINGS: The heart size and mediastinal contours are within normal limits. Both lungs are clear. Incidental note is made of diffuse idiopathic skeletal hyperostosis involving the thoracic spine. IMPRESSION: No active cardiopulmonary disease. Electronically Signed   By: Marlaine Hind M.D.   On: 05/04/2019 20:50   Ct Head Wo Contrast  Result Date: 05/04/2019 CLINICAL DATA:  Left-sided chest pain.  Headache and double vision. EXAM: CT HEAD WITHOUT CONTRAST TECHNIQUE: Contiguous axial images were obtained from the base of the skull through the vertex without intravenous contrast. COMPARISON:  July 03, 2018 FINDINGS: Brain: No evidence of acute infarction, hemorrhage, hydrocephalus, extra-axial collection or mass lesion/mass effect. Vascular: Calcified atherosclerosis is seen in the intracranial carotids. Skull: Normal. Negative for fracture or focal lesion. Sinuses/Orbits: No acute finding. Other: None. IMPRESSION: No acute intracranial abnormalities identified. Electronically Signed   By: Dorise Bullion III M.D   On: 05/04/2019 21:15    Pending Labs Unresulted Labs  (From admission, onward)    Start     Ordered   05/12/19 0500  Creatinine, serum  (enoxaparin (LOVENOX)    CrCl >/= 30 ml/min)  Weekly,   R    Comments: while on enoxaparin therapy    05/05/19 0314   05/05/19 0312  Novel Coronavirus,NAA,(SEND-OUT TO REF LAB - TAT 24-48 hrs); Hosp Order  (Asymptomatic Patients Labs)  ONCE - STAT,   STAT    Question:  Rule Out  Answer:  Yes   05/05/19 0311   05/05/19 0312  HIV antibody (Routine Testing)  Once,   STAT  05/05/19 0314   05/05/19 9458  Basic metabolic panel  STAT Now then every 4 hours ,   STAT     05/05/19 0314          Vitals/Pain Today's Vitals   05/05/19 0030 05/05/19 0039 05/05/19 0200 05/05/19 0245  BP: 126/67  130/80 (!) 142/90  Pulse: 99  86 89  Resp: 13  19 18   Temp:      TempSrc:      SpO2: 96%  94% 96%  PainSc:  10-Worst pain ever      Isolation Precautions No active isolations  Medications Medications  oxyCODONE-acetaminophen (PERCOCET/ROXICET) 5-325 MG per tablet 1 tablet (1 tablet Oral Not Given 05/04/19 2332)  dextrose 5 %-0.45 % sodium chloride infusion ( Intravenous New Bag/Given 05/05/19 0255)  0.9 %  sodium chloride infusion (has no administration in time range)  0.9 %  sodium chloride infusion (has no administration in time range)  dextrose 5 %-0.45 % sodium chloride infusion (has no administration in time range)  insulin regular, human (MYXREDLIN) 100 units/ 100 mL infusion (has no administration in time range)  enoxaparin (LOVENOX) injection 40 mg (has no administration in time range)  potassium chloride 10 mEq in 100 mL IVPB (has no administration in time range)  sodium chloride flush (NS) 0.9 % injection 3 mL (3 mLs Intravenous Given 05/04/19 2341)  oxyCODONE (Oxy IR/ROXICODONE) immediate release tablet 15 mg (15 mg Oral Given 05/05/19 0040)    Mobility walks Low fall risk   Focused Assessments Neuro Assessment Handoff:  Swallow screen pass? Yes    NIH Stroke Scale ( + Modified Stroke Scale  Criteria)  Interval: Initial Level of Consciousness (1a.)   : Alert, keenly responsive LOC Questions (1b. )   +: Answers both questions correctly LOC Commands (1c. )   + : Performs both tasks correctly Best Gaze (2. )  +: Normal Facial Palsy (4. )    : Normal symmetrical movements Motor Arm, Left (5a. )   +: No drift Motor Arm, Right (5b. )   +: No drift Motor Leg, Left (6a. )   +: No drift Motor Leg, Right (6b. )   +: No drift Limb Ataxia (7. ): Absent Sensory (8. )   +: Normal, no sensory loss Best Language (9. )   +: No aphasia Dysarthria (10. ): Normal Extinction/Inattention (11.)   +: No Abnormality     Neuro Assessment:   Neuro Checks:   Initial (05/05/19 0009)  Last Documented NIHSS Modified Score:   Has TPA been given? No If patient is a Neuro Trauma and patient is going to OR before floor call report to [place] nurse: [phone] or [phone]     R Recommendations: See Admitting Provider Note  Report given to:   Additional Notes:

## 2019-05-05 NOTE — Progress Notes (Signed)
Pt received from ED, AO x4. Pt on glucose stabilizer. CHG bath completed. Connected to tele, CCMD notified. On droplet precaution for covid rule out. Oriented patient to room and call bell system. Call bell within reach. Will continue to monitor.

## 2019-05-05 NOTE — Plan of Care (Signed)
Poc progressing.  

## 2019-05-06 DIAGNOSIS — R079 Chest pain, unspecified: Secondary | ICD-10-CM | POA: Diagnosis not present

## 2019-05-06 DIAGNOSIS — H538 Other visual disturbances: Secondary | ICD-10-CM | POA: Diagnosis not present

## 2019-05-06 DIAGNOSIS — E1110 Type 2 diabetes mellitus with ketoacidosis without coma: Secondary | ICD-10-CM | POA: Diagnosis not present

## 2019-05-06 LAB — GLUCOSE, CAPILLARY
Glucose-Capillary: 245 mg/dL — ABNORMAL HIGH (ref 70–99)
Glucose-Capillary: 282 mg/dL — ABNORMAL HIGH (ref 70–99)

## 2019-05-06 LAB — BASIC METABOLIC PANEL
Anion gap: 12 (ref 5–15)
BUN: 10 mg/dL (ref 6–20)
CO2: 24 mmol/L (ref 22–32)
Calcium: 9 mg/dL (ref 8.9–10.3)
Chloride: 96 mmol/L — ABNORMAL LOW (ref 98–111)
Creatinine, Ser: 0.58 mg/dL — ABNORMAL LOW (ref 0.61–1.24)
GFR calc Af Amer: >60 mL/min (ref >60)
GFR calc non Af Amer: >60 mL/min (ref >60)
Glucose, Bld: 258 mg/dL — ABNORMAL HIGH (ref 70–99)
Potassium: 4.5 mmol/L (ref 3.5–5.1)
Sodium: 132 mmol/L — ABNORMAL LOW (ref 135–145)

## 2019-05-06 MED ORDER — LEVEMIR FLEXTOUCH 100 UNIT/ML ~~LOC~~ SOPN: 60.0000 [IU] | PEN_INJECTOR | Freq: Every day | SUBCUTANEOUS | 11 refills | Status: DC

## 2019-05-06 MED ORDER — OMEGA-3-ACID ETHYL ESTERS 1 G PO CAPS
2.0000 g | ORAL_CAPSULE | Freq: Two times a day (BID) | ORAL | Status: DC
  Administered 2019-05-06: 2 g via ORAL
  Filled 2019-05-06: qty 2

## 2019-05-06 MED ORDER — INSULIN DETEMIR 100 UNIT/ML ~~LOC~~ SOLN
50.0000 [IU] | Freq: Every day | SUBCUTANEOUS | Status: DC
  Administered 2019-05-06: 10:00:00 50 [IU] via SUBCUTANEOUS
  Filled 2019-05-06: qty 0.5

## 2019-05-06 NOTE — Progress Notes (Addendum)
Inpatient Diabetes Program Recommendations  AACE/ADA: New Consensus Statement on Inpatient Glycemic Control (2015)  Target Ranges:  Prepandial:   less than 140 mg/dL      Peak postprandial:   less than 180 mg/dL (1-2 hours)      Critically ill patients:  140 - 180 mg/dL     Review of Glycemic Control  Diabetes history: DM 2 Outpatient Diabetes medications: Levemir 55 units Daily, Farxiga 10 mg Daily, Metformin 1000 mg bid, Glipizide 10 mg bid Current orders for Inpatient glycemic control:  Levemir 50 units, Novolog 0-15 units tid, Novolog 0-5 units qhs  A1c 10.5 on 7/12  Inpatient Diabetes Program Recommendations:    Patient admitted with DKA, also noted patient on Farxiga an SGLT-2 inhibitor that is known to cause DKA in patients.  Noted current trends elevated. Levemir increased closer to home dose, 50 units Daily today.  Will see patient to inquire when he was placed on Farxiga, however may need to d/c at time of d/c until he can follow up with the MD that placed him on it.  Addendum 1:35pm:  Spoke with patient regarding A1c level. Discussed farxiga having risk for DKA and to follow up with his physician. Patient's glucose runs slightly elevated but did spike up recently with an addition of another home medication. Told patient to follow up with his PCP> Discussed for him to monitor his glucose closely and we want his glucose better controlled at a goal of 7% or less.  Thanks,  Tama Headings RN, MSN, BC-ADM Inpatient Diabetes Coordinator Team Pager [phone] (8a-5p)

## 2019-05-06 NOTE — Telephone Encounter (Signed)
Spoke with [facility]. Patient has picked up Rx - assume it was approved with [place] Medicaid

## 2019-05-06 NOTE — Discharge Instructions (Signed)
Your A1c was 10.5% this admission. You need close follow up with your doctor to get your glucose levels down to a 7% or less (150 blood sugar all the time).

## 2019-05-06 NOTE — Discharge Summary (Signed)
Physician Discharge Summary  [name]:[id number] DOB: 1961/02/17 DOA: 05/04/2019  PCP: [name], FNP  Admit date: 05/04/2019 Discharge date: 05/06/2019  Time spent: 45 minutes  Recommendations for Outpatient Follow-up:  1. Follow up with PCP as scheduled for evaluation of symptoms, DM control, medication review 2. Follow up with cardiology regarding medication changes.    Discharge Diagnoses:  Principal Problem:   DKA (diabetic ketoacidoses) ([place]) Active Problems:   Nonspecific chest pain   Dyslipidemia   HTN (hypertension)   CAD (coronary artery disease)   Blurred vision   Discharge Condition: stable  Diet recommendation: heart healthy carb modified  Filed Weights   05/05/19 0447  Weight: (!) 136.3 kg    History of present illness:  [name]  is a 58 y.o. male,w hypertension, hyperlipidemia, dm2 w neuropathy, CAD s/p DES to LAD in 2017, h/o DVT, h/o Stroke,  Presented 7/12 with varied complaints in particular blurred vision and headache starting the day prior.  Pt was concerned about stroke and therefore presented to ED.  Pt had some substernal chest pain but this had been going on since 2016.   Hospital Course:  Hyperglycemia w DKA . In setting of Farxiga that, according to diabetes coordinator is known to cause DKA . Resolved at discharge.  Poor control at best. Hg A1c 10.5 on 7/12. Patient states readings at home "between 150 and 200". He was placed on insulin gtt that was discontinued 7/12 in afternoon. Home levemir increased and Farxiga stopped. Has appointment with PCP in 2 weeks.   Blurred vision likely secondary to hyperglycemia. MRI unremarkable. CT head without acute abnormality. Resolved at discharge  Chest pain Prior cardiac catheterization 2019-> no significant lesions Chronic. No events on tele. Pain free at discharge  CAD s/p DES to LAD 2017 Cont Aspirin 81mg  po qday Cont Lipitor 80mg  po qhs Cont carvedilol 25mg  po bid stop  Vascepa  Cont Imdur 60mg  po qday Cont Lisinopril 10mg  po qday Cont Lasix 40mg  po tid  Dm2 see #1 stop Farxiga  Increase Levemir  Anxiety Cont Zoloft 50mg  po qday Cont Duloxetine 60mg  po qday Cont Trazodone   Procedures: Consultations:    Discharge Exam: Vitals:   05/06/19 0700 05/06/19 0900  BP:    Pulse:  (!) 55  Resp: 20 19  Temp:    SpO2:  100%    General: sitting in chair alert and oriented Cardiovascular: rrr no mgr trace LE edema Respiratory: normal effort BS clear bilaterally no wheeze  Discharge Instructions   Discharge Instructions    Call MD for:  difficulty breathing, headache or visual disturbances   Complete by: As directed    Call MD for:  persistant dizziness or light-headedness   Complete by: As directed    Call MD for:  temperature >100.4   Complete by: As directed    Diet - low sodium heart healthy   Complete by: As directed    Discharge instructions   Complete by: As directed    Follow up with PCP as scheduled for evaluation of symptoms, DM control and review of medication changes.  Follow up with cardiology regarding medication changes   Increase activity slowly   Complete by: As directed      Allergies as of 05/06/2019      Reactions   Coconut Flavor [flavoring Agent] Hives   Coconut Oil Hives   Ibuprofen Other (See Comments)   Made gastric ulcers worse   Aleve [naproxen] Other (See Comments)  DUE TO KIDNEYS   Nsaids Other (See Comments)   Stomach ulcers      Medication List    STOP taking these medications   famotidine 40 MG tablet Commonly known as: PEPCID   Farxiga 10 MG Tabs tablet Generic drug: dapagliflozin propanediol   Vascepa 1 g Caps Generic drug: Icosapent Ethyl     TAKE these medications   Albuterol Sulfate 108 (90 Base) MCG/ACT Aepb Commonly known as: ProAir RespiClick Inhale 2 puffs into the lungs every 6 (six) hours as needed. What changed: reasons to take this   aspirin 81 MG chewable  tablet Chew 1 tablet (81 mg total) by mouth daily.   atorvastatin 80 MG tablet Commonly known as: LIPITOR Take 1 tablet (80 mg total) by mouth daily.   carvedilol 25 MG tablet Commonly known as: COREG Take 1 tablet (25 mg total) by mouth 2 (two) times daily with a meal.   diclofenac 1.3 % Ptch Commonly known as: FLECTOR Place 1 patch onto the skin daily as needed (for pain).   diclofenac sodium 1 % Gel Commonly known as: VOLTAREN Apply 1 application topically 4 (four) times daily. What changed:   when to take this  reasons to take this   DULoxetine 60 MG capsule Commonly known as: CYMBALTA TAKE 1 CAPSULE BY MOUTH EVERY DAY What changed: how much to take   furosemide 40 MG tablet Commonly known as: LASIX Take 40 mg by mouth 3 (three) times daily.   glipiZIDE 10 MG tablet Commonly known as: GLUCOTROL Take 10 mg by mouth 2 (two) times daily.   Insulin Pen Needle 31G X 5 MM Misc Use 1 needle daily to inject insulin as prescribed   isosorbide mononitrate 60 MG 24 hr tablet Commonly known as: IMDUR Take 1 tablet (60 mg total) by mouth daily.   Levemir FlexTouch 100 UNIT/ML Pen Generic drug: Insulin Detemir Inject 60 Units into the skin daily. What changed: how much to take   lisinopril 10 MG tablet Commonly known as: ZESTRIL Take 1 tablet (10 mg total) by mouth daily.   metFORMIN 500 MG 24 hr tablet Commonly known as: GLUCOPHAGE-XR Take 2 tablets (1,000 mg total) by mouth 2 (two) times daily.   nitroGLYCERIN 0.4 MG SL tablet Commonly known as: NITROSTAT Place 1 tablet (0.4 mg total) under the tongue every 5 (five) minutes as needed for chest pain.   oxyCODONE 15 MG immediate release tablet Commonly known as: ROXICODONE Take 15 mg 3 (three) times daily as needed by mouth for pain.   potassium chloride SA 20 MEQ tablet Commonly known as: K-DUR Take 20-40 mEq by mouth See admin instructions. Take 2 tablets at lunch then take 1 tablet at dinner and at  bedtime   sertraline 50 MG tablet Commonly known as: ZOLOFT Take 50 mg by mouth daily.   tiZANidine 2 MG tablet Commonly known as: ZANAFLEX Take 2 mg by mouth 2 (two) times daily as needed for pain.   traZODone 50 MG tablet Commonly known as: DESYREL Take 1 tablet (50 mg total) by mouth at bedtime.      Allergies  Allergen Reactions  . Coconut Flavor [Flavoring Agent] Hives  . Coconut Oil Hives  . Ibuprofen Other (See Comments)    Made gastric ulcers worse  . Aleve [Naproxen] Other (See Comments)    DUE TO KIDNEYS  . Nsaids Other (See Comments)    Stomach ulcers       The results of significant diagnostics from this hospitalization (  including imaging, microbiology, ancillary and laboratory) are listed below for reference.    Significant Diagnostic Studies: Dg Chest 2 View  Result Date: 05/04/2019 CLINICAL DATA:  Acute onset left chest pain radiating to jaw 1 hour ago. Coronary artery disease. Diabetes. EXAM: CHEST - 2 VIEW COMPARISON:  11/07/2018 FINDINGS: The heart size and mediastinal contours are within normal limits. Both lungs are clear. Incidental note is made of diffuse idiopathic skeletal hyperostosis involving the thoracic spine. IMPRESSION: No active cardiopulmonary disease. Electronically Signed   By: Marlaine Hind M.D.   On: 05/04/2019 20:50   Ct Head Wo Contrast  Result Date: 05/04/2019 CLINICAL DATA:  Left-sided chest pain.  Headache and double vision. EXAM: CT HEAD WITHOUT CONTRAST TECHNIQUE: Contiguous axial images were obtained from the base of the skull through the vertex without intravenous contrast. COMPARISON:  July 03, 2018 FINDINGS: Brain: No evidence of acute infarction, hemorrhage, hydrocephalus, extra-axial collection or mass lesion/mass effect. Vascular: Calcified atherosclerosis is seen in the intracranial carotids. Skull: Normal. Negative for fracture or focal lesion. Sinuses/Orbits: No acute finding. Other: None. IMPRESSION: No acute  intracranial abnormalities identified. Electronically Signed   By: Dorise Bullion III M.D   On: 05/04/2019 21:15   US Abdomen Limited  Result Date: 04/17/2019 CLINICAL DATA:  Palpable lump. EXAM: ULTRASOUND ABDOMEN LIMITED COMPARISON:  None. FINDINGS: In the patient's palpable area of concern, there is no sonographic abnormality detected. No discrete mass or fluid collection. IMPRESSION: No sonographic abnormality detected in the patient's palpable area of concern. Electronically Signed   By: Constance Holster M.D.   On: 04/17/2019 20:25    Microbiology: No results found for this or any previous visit (from the past 240 hour(s)).   Labs: Basic Metabolic Panel: Recent Labs  Lab 05/05/19 0500 05/05/19 1055 05/05/19 1512 05/05/19 1917 05/06/19 0410  NA 135 136 134* 133* 132*  K 3.5 4.1 4.3 4.5 4.5  CL 100 100 100 99 96*  CO2 24 23 23 22 24   GLUCOSE 220* 175* 260* 273* 258*  BUN 12 10 10 11 10   CREATININE 0.66 0.69 0.70 0.73 0.58*  CALCIUM 8.9 9.2 9.1 9.1 9.0   Liver Function Tests: No results for input(s): AST, ALT, ALKPHOS, BILITOT, PROT, ALBUMIN in the last 168 hours. No results for input(s): LIPASE, AMYLASE in the last 168 hours. No results for input(s): AMMONIA in the last 168 hours. CBC: Recent Labs  Lab 05/04/19 2037 05/05/19 0125  WBC 9.5  --   NEUTROABS 5.7  --   HGB 13.5 13.3  HCT 41.1 39.0  MCV 91.9  --   PLT 239  --    Cardiac Enzymes: Recent Labs  Lab 05/05/19 0500  CKTOTAL 91  CKMB 2.9   BNP: BNP (last 3 results) No results for input(s): BNP in the last 8760 hours.  ProBNP (last 3 results) No results for input(s): PROBNP in the last 8760 hours.  CBG: Recent Labs  Lab 05/05/19 1123 05/05/19 1637 05/05/19 2153 05/06/19 0634 05/06/19 1114  GLUCAP 225* 256* 360* 245* 282*       Signed:  Radene Gunning NP [facility] 05/06/2019, 12:58 PM

## 2019-05-06 NOTE — Progress Notes (Signed)
Discharged home w/ paperwork and belongings. Verbalized understanding. Escorted to [employer] entrance via wheelchair.

## 2019-05-08 LAB — NOVEL CORONAVIRUS, NAA (HOSP ORDER, SEND-OUT TO REF LAB; TAT 18-24 HRS): SARS-CoV-2, NAA: NOT DETECTED

## 2019-06-05 ENCOUNTER — Emergency Department (HOSPITAL_COMMUNITY): Payer: Medicaid Other

## 2019-06-05 ENCOUNTER — Emergency Department (HOSPITAL_COMMUNITY)
Admission: EM | Admit: 2019-06-05 | Discharge: 2019-06-05 | Disposition: A | Discharge status: Home / Self Care | Payer: Medicaid Other | Attending: Emergency Medicine | Admitting: Emergency Medicine

## 2019-06-05 DIAGNOSIS — I2510 Atherosclerotic heart disease of native coronary artery without angina pectoris: Secondary | ICD-10-CM | POA: Diagnosis not present

## 2019-06-05 DIAGNOSIS — R0600 Dyspnea, unspecified: Secondary | ICD-10-CM | POA: Diagnosis not present

## 2019-06-05 DIAGNOSIS — R601 Generalized edema: Secondary | ICD-10-CM | POA: Insufficient documentation

## 2019-06-05 DIAGNOSIS — Z7982 Long term (current) use of aspirin: Secondary | ICD-10-CM | POA: Insufficient documentation

## 2019-06-05 DIAGNOSIS — R0602 Shortness of breath: Secondary | ICD-10-CM | POA: Diagnosis present

## 2019-06-05 DIAGNOSIS — I110 Hypertensive heart disease with heart failure: Secondary | ICD-10-CM | POA: Insufficient documentation

## 2019-06-05 DIAGNOSIS — Z79899 Other long term (current) drug therapy: Secondary | ICD-10-CM | POA: Diagnosis not present

## 2019-06-05 DIAGNOSIS — E119 Type 2 diabetes mellitus without complications: Secondary | ICD-10-CM | POA: Diagnosis not present

## 2019-06-05 DIAGNOSIS — I5032 Chronic diastolic (congestive) heart failure: Secondary | ICD-10-CM | POA: Insufficient documentation

## 2019-06-05 DIAGNOSIS — Z7984 Long term (current) use of oral hypoglycemic drugs: Secondary | ICD-10-CM | POA: Insufficient documentation

## 2019-06-05 DIAGNOSIS — Z86718 Personal history of other venous thrombosis and embolism: Secondary | ICD-10-CM | POA: Diagnosis not present

## 2019-06-05 LAB — BASIC METABOLIC PANEL
Anion gap: 13 (ref 5–15)
BUN: 9 mg/dL (ref 6–20)
CO2: 21 mmol/L — ABNORMAL LOW (ref 22–32)
Calcium: 8.6 mg/dL — ABNORMAL LOW (ref 8.9–10.3)
Chloride: 104 mmol/L (ref 98–111)
Creatinine, Ser: 0.83 mg/dL (ref 0.61–1.24)
GFR calc Af Amer: >60 mL/min (ref >60)
GFR calc non Af Amer: >60 mL/min (ref >60)
Glucose, Bld: 306 mg/dL — ABNORMAL HIGH (ref 70–99)
Potassium: 3.6 mmol/L (ref 3.5–5.1)
Sodium: 138 mmol/L (ref 135–145)

## 2019-06-05 LAB — BRAIN NATRIURETIC PEPTIDE: B Natriuretic Peptide: 24.1 pg/mL (ref 0.0–100.0)

## 2019-06-05 LAB — CBC
HCT: 33.4 % — ABNORMAL LOW (ref 39.0–52.0)
Hemoglobin: 10.6 g/dL — ABNORMAL LOW (ref 13.0–17.0)
MCH: 28.5 pg (ref 26.0–34.0)
MCHC: 31.7 g/dL (ref 30.0–36.0)
MCV: 89.8 fL (ref 80.0–100.0)
Platelets: 209 10*3/uL (ref 150–400)
RBC: 3.72 MIL/uL — ABNORMAL LOW (ref 4.22–5.81)
RDW: 15.1 % (ref 11.5–15.5)
WBC: 7.8 10*3/uL (ref 4.0–10.5)
nRBC: 0 % (ref 0.0–0.2)

## 2019-06-05 LAB — TROPONIN I (HIGH SENSITIVITY)
Troponin I (High Sensitivity): 5 ng/L (ref <18)
Troponin I (High Sensitivity): 5 ng/L (ref <18)

## 2019-06-05 LAB — D-DIMER, QUANTITATIVE: D-Dimer, Quant: 0.39 ug/mL-FEU (ref 0.00–0.50)

## 2019-06-05 MED ORDER — FUROSEMIDE 10 MG/ML IJ SOLN
40.0000 mg | Freq: Once | INTRAMUSCULAR | Status: AC
  Administered 2019-06-05: 40 mg via INTRAVENOUS
  Filled 2019-06-05: qty 4

## 2019-06-05 MED ORDER — SODIUM CHLORIDE 0.9% FLUSH: 3.0000 mL | Freq: Once | INTRAVENOUS | Status: DC

## 2019-06-05 MED ORDER — OXYCODONE HCL 5 MG PO TABS
10.0000 mg | ORAL_TABLET | Freq: Once | ORAL | Status: AC
  Administered 2019-06-05: 10 mg via ORAL
  Filled 2019-06-05: qty 2

## 2019-06-05 NOTE — ED Triage Notes (Signed)
Pt arrives to ED ambulatory with c/o of 3 day history of sob with swelling in his left arm and leg. Pt denies any chest pain, cough fever or chills.

## 2019-06-05 NOTE — ED Provider Notes (Signed)
[facility] EMERGENCY DEPARTMENT Provider Note   CSN: [id number] Arrival date & time: 06/05/19  1212    History   Chief Complaint Chief Complaint  Patient presents with  . Shortness of Breath    HPI DELVON CHIPPS is a 58 y.o. male.     The history is provided by the patient and medical records. No language interpreter was used.  Shortness of Breath  NAKAI POLLIO is a 58 y.o. male who presents to the Emergency Department complaining of sob. He presents for evaluation of shortness of breath over the last three days. He has dyspnea on exertion. He also complains of progressive swelling all over his body but it started on the left side. He denies any fevers, chest pain, abdominal pain. He does have a mild cough that is nonproductive. He has chronic back pain and this is worse compared to prior. Pain is located in the thoracic back. He was recently admitted to the hospital for DKA related to medications. He is compliant with his medications since hospital discharge. No history of blood clot. Symptoms are moderate, constant, worsening. Past Medical History:  Diagnosis Date  . Anemia   . Arthritis   . Back pain   . CAD (coronary artery disease)    a. s/p DES to LAD in 05/2016  . Cervical radiculopathy   . Chronic diastolic CHF (congestive heart failure) ([place])   . Chronic pain   . Depression   . DVT (deep venous thrombosis) ([place])   . Hematemesis   . Hepatic steatosis   . Hyperlipidemia   . Hypertension   . IBS (irritable bowel syndrome)   . Morbid obesity ([place])   . OSA (obstructive sleep apnea)   . Pancreatitis   . PE (pulmonary thromboembolism) ([place])   . Peripheral neuropathy   . PUD (peptic ulcer disease)   . Renal disorder   . Stroke [facility])    a. ?details unclear - not seen on imaging when he was admitted in 05/2017 for TIA symptoms which were felt due to cervical radiculopathy.  . Thoracic aortic ectasia ([facility])    a. 4.3cm ectatic ascending  thoracic aorta by CT 06/2017.   . Type 2 diabetes mellitus [facility])     Patient Active Problem List   Diagnosis Date Noted  . DKA (diabetic ketoacidoses) ([place]) 05/05/2019  . Blurred vision 05/05/2019  . Alteration consciousness 11/19/2018  . Neck pain 10/09/2018  . Left arm weakness 10/09/2018  . B12 deficiency 08/10/2017  . Persistent headaches 08/09/2017  . GERD (gastroesophageal reflux disease) 06/29/2017  . Left arm numbness   . Cerebral embolism with cerebral infarction 06/17/2017  . TIA (transient ischemic attack) 06/17/2017  . Chronic back pain 12/29/2016  . Depression 12/15/2016  . Vitamin D deficiency 12/09/2016  . Right hip pain 12/07/2016  . Hypokalemia 12/07/2016  . Type 2 diabetes mellitus with vascular disease ([place]) 05/31/2016  . Normocytic normochromic anemia 05/31/2016  . Chest pain 05/31/2016  . CAD (coronary artery disease) 01/06/2016  . DVT (deep venous thrombosis) ([place]) 01/06/2016  . Lactic acidosis 05/28/2014  . Nonspecific chest pain 01/29/2014  . Uncontrolled secondary diabetes with peripheral neuropathy ([place]) 01/29/2014  . Obesity, Class III, BMI 40-49.9 (morbid obesity) ([place]) 01/29/2014  . Snoring 01/29/2014  . Dyslipidemia 01/29/2014  . HTN (hypertension) 01/29/2014  . Abnormal nuclear stress test 01/29/2014    Past Surgical History:  Procedure Laterality Date  . CARDIAC CATHETERIZATION N/A 05/31/2016   Procedure: Left Heart Cath and  Coronary Angiography;  Surgeon: Peter M Martinique, MD;  Location: [facility] CV LAB;  Service: Cardiovascular;  Laterality: N/A;  . CARDIAC CATHETERIZATION N/A 05/31/2016   Procedure: Intravascular Pressure Wire/FFR Study;  Surgeon: Peter M Martinique, MD;  Location: [place] CV LAB;  Service: Cardiovascular;  Laterality: N/A;  . CARDIAC CATHETERIZATION N/A 05/31/2016   Procedure: Coronary Stent Intervention;  Surgeon: Peter M Martinique, MD;  Location: [facility] CV LAB;  Service: Cardiovascular;  Laterality: N/A;  . LEFT HEART  CATH AND CORONARY ANGIOGRAPHY N/A 12/08/2017   Procedure: LEFT HEART CATH AND CORONARY ANGIOGRAPHY;  Surgeon: Leonie Man, MD;  Location: [place] CV LAB;  Service: Cardiovascular;  Laterality: N/A;  . LEFT HEART CATHETERIZATION WITH CORONARY ANGIOGRAM N/A 02/03/2014   Procedure: LEFT HEART CATHETERIZATION WITH CORONARY ANGIOGRAM;  Surgeon: Pixie Casino, MD;  Location: [facility] CATH LAB;  Service: Cardiovascular;  Laterality: N/A;  . left leg stent           Home Medications    Prior to Admission medications   Medication Sig Start Date End Date Taking? Authorizing Provider  Albuterol Sulfate (PROAIR RESPICLICK) 097 (90 Base) MCG/ACT AEPB Inhale 2 puffs into the lungs every 6 (six) hours as needed. Patient taking differently: Inhale 2 puffs into the lungs every 6 (six) hours as needed (for breathing).  11/06/18   Robyn Haber, MD  aspirin 81 MG chewable tablet Chew 1 tablet (81 mg total) by mouth daily. 06/01/16   Elwin Mocha, MD  atorvastatin (LIPITOR) 80 MG tablet Take 1 tablet (80 mg total) by mouth daily. 01/26/17   Ahmed Prima, Fransisco Hertz, PA-C  carvedilol (COREG) 25 MG tablet Take 1 tablet (25 mg total) by mouth 2 (two) times daily with a meal. 01/26/17   Strader, [place] M, PA-C  diclofenac (FLECTOR) 1.3 % PTCH Place 1 patch onto the skin daily as needed (for pain).  12/31/18   [provider]  diclofenac sodium (VOLTAREN) 1 % GEL Apply 1 application topically 4 (four) times daily. Patient taking differently: Apply 1 application topically 4 (four) times daily as needed (for pain).  04/05/18   Melynda Ripple, MD  DULoxetine (CYMBALTA) 60 MG capsule TAKE 1 CAPSULE BY MOUTH EVERY DAY Patient taking differently: Take 60 mg by mouth daily.  12/24/18   Marcial Pacas, MD  furosemide (LASIX) 40 MG tablet Take 40 mg by mouth 3 (three) times daily.    [provider]  glipiZIDE (GLUCOTROL) 10 MG tablet Take 10 mg by mouth 2 (two) times daily. 05/30/17   [provider]   Insulin Detemir (LEVEMIR FLEXTOUCH) 100 UNIT/ML Pen Inject 60 Units into the skin daily. 05/06/19   Black, Lezlie Octave, NP  Insulin Pen Needle 31G X 5 MM MISC Use 1 needle daily to inject insulin as prescribed 06/18/17   Barton Dubois, MD  isosorbide mononitrate (IMDUR) 60 MG 24 hr tablet Take 1 tablet (60 mg total) by mouth daily. 02/06/19   Skeet Latch, MD  lisinopril (PRINIVIL,ZESTRIL) 10 MG tablet Take 1 tablet (10 mg total) by mouth daily. 12/10/17   Velvet Bathe, MD  metFORMIN (GLUCOPHAGE-XR) 500 MG 24 hr tablet Take 2 tablets (1,000 mg total) by mouth 2 (two) times daily. 07/02/17   Barton Dubois, MD  nitroGLYCERIN (NITROSTAT) 0.4 MG SL tablet Place 1 tablet (0.4 mg total) under the tongue every 5 (five) minutes as needed for chest pain. 02/06/19   Skeet Latch, MD  oxyCODONE (ROXICODONE) 15 MG immediate release tablet Take 15 mg  3 (three) times daily as needed by mouth for pain.  08/28/17   [provider]  potassium chloride SA (K-DUR,KLOR-CON) 20 MEQ tablet Take 20-40 mEq by mouth See admin instructions. Take 2 tablets at lunch then take 1 tablet at dinner and at bedtime    [provider]  sertraline (ZOLOFT) 50 MG tablet Take 50 mg by mouth daily.    [provider]  tiZANidine (ZANAFLEX) 2 MG tablet Take 2 mg by mouth 2 (two) times daily as needed for pain.    [provider]  traZODone (DESYREL) 50 MG tablet Take 1 tablet (50 mg total) by mouth at bedtime. 12/15/16   Florinda Marker, MD    Family History Family History  Problem Relation Age of Onset  . Cancer Father   . Hypertension Mother   . Diabetes Mother   . Breast cancer Mother   . Hypertension Brother   . Diabetes Brother   . Hypertension Sister   . Diabetes Sister     Social History Social History   Tobacco Use  . Smoking status: Never Smoker  . Smokeless tobacco: Never Used  Substance Use Topics  . Alcohol use: No  . Drug use: No     Allergies   Coconut flavor  [flavoring agent], Coconut oil, Ibuprofen, Aleve [naproxen], and Nsaids   Review of Systems Review of Systems  Respiratory: Positive for shortness of breath.   All other systems reviewed and are negative.    Physical Exam Updated Vital Signs BP (!) 144/82   Pulse 61   Temp 98.8 F (37.1 C) (Oral)   Resp 13   SpO2 99%   Physical Exam Vitals signs and nursing note reviewed.  Constitutional:      Appearance: He is well-developed.  HENT:     Head: Normocephalic and atraumatic.  Cardiovascular:     Rate and Rhythm: Normal rate and regular rhythm.     Heart sounds: No murmur.  Pulmonary:     Effort: Pulmonary effort is normal. No respiratory distress.     Breath sounds: Normal breath sounds.  Abdominal:     Palpations: Abdomen is soft.     Tenderness: There is no abdominal tenderness. There is no guarding or rebound.  Musculoskeletal:        General: No tenderness.     Comments: Nonpitting edema to BLE and BUE  Skin:    General: Skin is warm and dry.  Neurological:     Mental Status: He is alert and oriented to person, place, and time.  Psychiatric:        Behavior: Behavior normal.      ED Treatments / Results  Labs (all labs ordered are listed, but only abnormal results are displayed) Labs Reviewed  BASIC METABOLIC PANEL - Abnormal; Notable for the following components:      Result Value   CO2 21 (*)    Glucose, Bld 306 (*)    Calcium 8.6 (*)    All other components within normal limits  CBC - Abnormal; Notable for the following components:   RBC 3.72 (*)    Hemoglobin 10.6 (*)    HCT 33.4 (*)    All other components within normal limits  BRAIN NATRIURETIC PEPTIDE  D-DIMER, QUANTITATIVE (NOT AT [facility])  TROPONIN I (HIGH SENSITIVITY)  TROPONIN I (HIGH SENSITIVITY)    EKG EKG Interpretation  Date/Time:  Wednesday June 05 2019 12:15:03 EDT Ventricular Rate:  76 PR Interval:  164 QRS Duration: 76  QT Interval:  400 QTC Calculation: 450 R Axis:   109  Text Interpretation:  Normal sinus rhythm Rightward axis Cannot rule out Anterior infarct , age undetermined Abnormal ECG Confirmed by Quintella Reichert [phone]) on 06/05/2019 2:31:31 PM   Radiology Dg Chest 2 View  Result Date: 06/05/2019 CLINICAL DATA:  Shortness of breath for 3 days with swelling of the legs. EXAM: CHEST - 2 VIEW COMPARISON:  May 04, 2019 FINDINGS: The mediastinal contour and cardiac silhouette are normal. There is no focal infiltrate, pulmonary edema, or pleural effusion. Degenerative joint changes of the spine are identified. IMPRESSION: No active cardiopulmonary disease. Electronically Signed   By: Abelardo Diesel M.D.   On: 06/05/2019 12:49    Procedures Procedures (including critical care time)  Medications Ordered in ED Medications  sodium chloride flush (NS) 0.9 % injection 3 mL (has no administration in time range)  oxyCODONE (Oxy IR/ROXICODONE) immediate release tablet 10 mg (has no administration in time range)  furosemide (LASIX) injection 40 mg (has no administration in time range)     Initial Impression / Assessment and Plan / ED Course  I have reviewed the triage vital signs and the nursing notes.  Pertinent labs & imaging results that were available during my care of the patient were reviewed by me and considered in my medical decision making (see chart for details).        Patient with history of diabetes, hypertension here for evaluation of progressive edema and shortness of breath. He does have some edema on examination. He has no respiratory distress, crackles or pulmonary edema on imaging. Presentation is not consistent with ACS, PE, CHF, respiratory failure. Discussed with patient home care for edema as well as dyspnea. Discussed outpatient follow-up as well as return precautions.  Final Clinical Impressions(s) / ED Diagnoses   Final diagnoses:  Dyspnea, unspecified type  Generalized edema    ED Discharge Orders    None       Quintella Reichert, MD 06/05/19 [phone]

## 2019-06-05 NOTE — ED Notes (Signed)
Pt O2 sat 97% @ bedside. During ambulation pt O2 sat remained @ 97%. Pt ambulated self efficiently with no difficulty, no SHOB, and no complaints of dizziness. Pt returned safely to bedside.

## 2019-06-20 ENCOUNTER — Encounter: Payer: Self-pay | Admitting: Family Medicine

## 2019-06-20 ENCOUNTER — Ambulatory Visit (INDEPENDENT_AMBULATORY_CARE_PROVIDER_SITE_OTHER): Payer: Medicaid Other | Admitting: Family Medicine

## 2019-06-20 DIAGNOSIS — M19022 Primary osteoarthritis, left elbow: Secondary | ICD-10-CM

## 2019-06-20 DIAGNOSIS — M25522 Pain in left elbow: Secondary | ICD-10-CM
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
## 2019-07-18 ENCOUNTER — Other Ambulatory Visit: Payer: Self-pay

## 2019-07-18 ENCOUNTER — Ambulatory Visit (HOSPITAL_COMMUNITY)
Admission: EM | Admit: 2019-07-18 | Discharge: 2019-07-18 | Disposition: A | Discharge status: Home / Self Care | Payer: Medicaid Other | Attending: Internal Medicine | Admitting: Internal Medicine

## 2019-07-18 DIAGNOSIS — M5440 Lumbago with sciatica, unspecified side: Secondary | ICD-10-CM

## 2019-07-18 MED ORDER — DEXAMETHASONE SODIUM PHOSPHATE 10 MG/ML IJ SOLN
10.0000 mg | Freq: Once | INTRAMUSCULAR | Status: AC
  Administered 2019-07-18: 10 mg via INTRAMUSCULAR

## 2019-07-18 MED ORDER — DEXAMETHASONE SODIUM PHOSPHATE 10 MG/ML IJ SOLN
INTRAMUSCULAR | Status: AC
  Filled 2019-07-18: qty 1

## 2019-07-18 MED ORDER — PREDNISONE 20 MG PO TABS: 40.0000 mg | ORAL_TABLET | Freq: Every day | ORAL | 0 refills | Status: AC

## 2019-07-18 NOTE — ED Provider Notes (Addendum)
[facility]    CSN: [id number] Arrival date & time: 07/18/19  1622      History   Chief Complaint Chief Complaint  Patient presents with  . Back Pain    HPI [name] is a 58 y.o. male with a history of hypertension-controlled, hyperlipidemia-controlled comes to urgent care with complaints of severe back pain of a couple of days duration.  Pain is sharp in nature and is currently 10 out of 10.  Is worse with movement.  Current pain medication regimen has not helped patient.  Pain radiating to both legs.  No trauma to the back.  No bruising.  No change in bowel or bladder habits.  No urinary retention or bowel incontinence.   HPI  Past Medical History:  Diagnosis Date  . Anemia   . Arthritis   . Back pain   . CAD (coronary artery disease)    a. s/p DES to LAD in 05/2016  . Cervical radiculopathy   . Chronic diastolic CHF (congestive heart failure) ([place])   . Chronic pain   . Depression   . DVT (deep venous thrombosis) ([place])   . Hematemesis   . Hepatic steatosis   . Hyperlipidemia   . Hypertension   . IBS (irritable bowel syndrome)   . Morbid obesity ([place])   . OSA (obstructive sleep apnea)   . Pancreatitis   . PE (pulmonary thromboembolism) ([place])   . Peripheral neuropathy   . PUD (peptic ulcer disease)   . Renal disorder   . Stroke [facility])    a. ?details unclear - not seen on imaging when he was admitted in 05/2017 for TIA symptoms which were felt due to cervical radiculopathy.  . Thoracic aortic ectasia ([facility])    a. 4.3cm ectatic ascending thoracic aorta by CT 06/2017.   . Type 2 diabetes mellitus [facility])     Patient Active Problem List   Diagnosis Date Noted  . DKA (diabetic ketoacidoses) ([place]) 05/05/2019  . Blurred vision 05/05/2019  . Alteration consciousness 11/19/2018  . Neck pain 10/09/2018  . Left arm weakness 10/09/2018  . B12 deficiency 08/10/2017  . Persistent headaches 08/09/2017  . GERD (gastroesophageal reflux disease) 06/29/2017  .  Left arm numbness   . Cerebral embolism with cerebral infarction 06/17/2017  . TIA (transient ischemic attack) 06/17/2017  . Chronic back pain 12/29/2016  . Depression 12/15/2016  . Vitamin D deficiency 12/09/2016  . Right hip pain 12/07/2016  . Hypokalemia 12/07/2016  . Type 2 diabetes mellitus with vascular disease ([place]) 05/31/2016  . Normocytic normochromic anemia 05/31/2016  . Chest pain 05/31/2016  . CAD (coronary artery disease) 01/06/2016  . DVT (deep venous thrombosis) ([place]) 01/06/2016  . Lactic acidosis 05/28/2014  . Nonspecific chest pain 01/29/2014  . Uncontrolled secondary diabetes with peripheral neuropathy ([place]) 01/29/2014  . Obesity, Class III, BMI 40-49.9 (morbid obesity) ([place]) 01/29/2014  . Snoring 01/29/2014  . Dyslipidemia 01/29/2014  . HTN (hypertension) 01/29/2014  . Abnormal nuclear stress test 01/29/2014    Past Surgical History:  Procedure Laterality Date  . CARDIAC CATHETERIZATION N/A 05/31/2016   Procedure: Left Heart Cath and Coronary Angiography;  Surgeon: Peter M Martinique, MD;  Location: [place] CV [facility];  Service: Cardiovascular;  Laterality: N/A;  . CARDIAC CATHETERIZATION N/A 05/31/2016   Procedure: Intravascular Pressure Wire/FFR Study;  Surgeon: Peter M Martinique, MD;  Location: [place] CV LAB;  Service: Cardiovascular;  Laterality: N/A;  . CARDIAC CATHETERIZATION N/A 05/31/2016   Procedure: Coronary Stent Intervention;  Surgeon: Peter M Martinique, MD;  Location: [place] [facility];  Service: Cardiovascular;  Laterality: N/A;  . LEFT HEART CATH AND CORONARY ANGIOGRAPHY N/A 12/08/2017   Procedure: LEFT HEART CATH AND CORONARY ANGIOGRAPHY;  Surgeon: Leonie Man, MD;  Location: [place] CV LAB;  Service: Cardiovascular;  Laterality: N/A;  . LEFT HEART CATHETERIZATION WITH CORONARY ANGIOGRAM N/A 02/03/2014   Procedure: LEFT HEART CATHETERIZATION WITH CORONARY ANGIOGRAM;  Surgeon: Pixie Casino, MD;  Location: [facility] C[facility] LAB;  Service: Cardiovascular;   Laterality: N/A;  . left leg stent          Home Medications    Prior to Admission medications   Medication Sig Start Date End Date Taking? Authorizing Provider  Accu-Chek FastClix Lancets MISC CHECK BLOOD GLUCOSE 2 TIMES/DAY. DX E11.65 03/20/19   [provider]  Albuterol Sulfate (PROAIR RESPICLICK) [id number] (90 Base) MCG/ACT AEPB Inhale 2 puffs into the lungs every 6 (six) hours as needed. Patient taking differently: Inhale 2 puffs into the lungs every 6 (six) hours as needed (for breathing).  11/06/18   Robyn Haber, MD  aspirin 81 MG chewable tablet Chew 1 tablet (81 mg total) by mouth daily. 06/01/16   Elwin Mocha, MD  atorvastatin (LIPITOR) 80 MG tablet Take 1 tablet (80 mg total) by mouth daily. 01/26/17   Ahmed Prima, Fransisco Hertz, PA-C  carvedilol (COREG) 25 MG tablet Take 1 tablet (25 mg total) by mouth 2 (two) times daily with a meal. 01/26/17   Strader, [place] M, PA-C  diclofenac (FLECTOR) 1.3 % PTCH Place 1 patch onto the skin daily as needed (for pain).  12/31/18   [provider]  diclofenac sodium (VOLTAREN) 1 % GEL Apply 1 application topically 4 (four) times daily. Patient taking differently: Apply 1 application topically 4 (four) times daily as needed (for pain).  04/05/18   Melynda Ripple, MD  DULoxetine (CYMBALTA) 60 MG capsule TAKE 1 CAPSULE BY MOUTH EVERY DAY Patient taking differently: Take 60 mg by mouth daily.  12/24/18   Marcial Pacas, MD  furosemide (LASIX) 40 MG tablet Take 40 mg by mouth 3 (three) times daily.    [provider]  glipiZIDE (GLUCOTROL) 10 MG tablet Take 10 mg by mouth 2 (two) times daily. 05/30/17   [provider]  Insulin Detemir (LEVEMIR FLEXTOUCH) 100 UNIT/ML Pen Inject 60 Units into the skin daily. 05/06/19   Black, Lezlie Octave, NP  Insulin Pen Needle 31G X 5 MM MISC Use 1 needle daily to inject insulin as prescribed 06/18/17   Barton Dubois, MD  isosorbide mononitrate (IMDUR) 60 MG 24 hr tablet Take 1 tablet (60 mg total)  by mouth daily. 02/06/19   Skeet Latch, MD  lisinopril (PRINIVIL,ZESTRIL) 10 MG tablet Take 1 tablet (10 mg total) by mouth daily. 12/10/17   Velvet Bathe, MD  metFORMIN (GLUCOPHAGE-XR) 500 MG 24 hr tablet Take 2 tablets (1,000 mg total) by mouth 2 (two) times daily. 07/02/17   Barton Dubois, MD  nitroGLYCERIN (NITROSTAT) 0.4 MG SL tablet Place 1 tablet (0.4 mg total) under the tongue every 5 (five) minutes as needed for chest pain. 02/06/19   Skeet Latch, MD  NOVOLOG FLEXPEN 100 UNIT/ML FlexPen USE 3 TIMES DAILY BEFORE MEALS PER SSI 151 200 4U, 201 250 6U, 251 300 8U, 301 350 10U, 351 400 12U. 05/13/19   [provider]  oxyCODONE (ROXICODONE) 15 MG immediate release tablet Take 15 mg 3 (three) times daily as needed by mouth for pain.  08/28/17  [provider]  potassium chloride SA (K-DUR,KLOR-CON) 20 MEQ tablet Take 20-40 mEq by mouth See admin instructions. Take 2 tablets at lunch then take 1 tablet at dinner and at bedtime    [provider]  pregabalin (LYRICA) 300 MG capsule Take 300 mg by mouth 2 (two) times daily. 05/18/19   [provider]  sertraline (ZOLOFT) 50 MG tablet Take 50 mg by mouth daily.    [provider]  spironolactone (ALDACTONE) 25 MG tablet TAKE 1 TABLET (25 MG) BY ORAL ROUTE ONCE DAILY FOR 14 DAYS 06/13/19   [provider]  tiZANidine (ZANAFLEX) 2 MG tablet Take 2 mg by mouth 2 (two) times daily as needed for pain.    [provider]  traZODone (DESYREL) 50 MG tablet Take 1 tablet (50 mg total) by mouth at bedtime. 12/15/16   Florinda Marker, MD    Family History Family History  Problem Relation Age of Onset  . Cancer Father   . Hypertension Mother   . Diabetes Mother   . Breast cancer Mother   . Hypertension Brother   . Diabetes Brother   . Hypertension Sister   . Diabetes Sister     Social History Social History   Tobacco Use  . Smoking status: Never Smoker  . Smokeless tobacco: Never  Used  Substance Use Topics  . Alcohol use: No  . Drug use: No     Allergies   Coconut flavor [flavoring agent], Coconut oil, Ibuprofen, Aleve [naproxen], and Nsaids   Review of Systems Review of Systems  Constitutional: Positive for activity change. Negative for fatigue.  HENT: Negative.   Eyes: Negative.   Respiratory: Negative.   Cardiovascular: Negative.   Gastrointestinal: Negative.   Genitourinary: Negative.   Musculoskeletal: Positive for arthralgias, back pain and gait problem.  Skin: Negative.   Neurological: Positive for numbness. Negative for dizziness, weakness and headaches.  All other systems reviewed and are negative.    Physical Exam Triage Vital Signs ED Triage Vitals  Enc Vitals Group     BP 07/18/19 1737 113/65     Pulse Rate 07/18/19 1737 94     Resp 07/18/19 1737 18     Temp 07/18/19 1737 (!) 97.1 F (36.2 C)     Temp Source 07/18/19 1737 Temporal     SpO2 07/18/19 1737 95 %     Weight --      Height --      Head Circumference --      Peak Flow --      Pain Score 07/18/19 1738 8     Pain Loc --      Pain Edu? --      Excl. in [place]? --    No data found.  Updated Vital Signs BP 113/65 (BP Location: Right Arm)   Pulse 94   Temp (!) 97.1 F (36.2 C) (Temporal)   Resp 18   SpO2 95%   Visual Acuity Right Eye Distance:   Left Eye Distance:   Bilateral Distance:    Right Eye Near:   Left Eye Near:    Bilateral Near:     Physical Exam   UC Treatments / Results  Labs (all labs ordered are listed, but only abnormal results are displayed) Labs Reviewed - No data to display  EKG   Radiology No results found.  Procedures Procedures (including critical care time)  Medications Ordered in UC Medications  dexamethasone (DECADRON) injection 10 mg (has no administration in  time range)    Initial Impression / Assessment and Plan / UC Course  I have reviewed the triage vital signs and the nursing notes.  Pertinent labs & imaging  results that were available during my care of the patient were reviewed by me and considered in my medical decision making (see chart for details).     1.  Lower back pain with sciatica: Continue current pain medication regimen Short course of steroids i.e. prednisone Dexamethasone 10 mg IM x1 If patient develops worsening back pain, weakness in the lower extremity or urinary/bowel continence problems he will need CT scan of the spine to rule out cervical spine or lumbar spine stenosis. Final Clinical Impressions(s) / UC Diagnoses   Final diagnoses:  Low back pain with sciatica, sciatica laterality unspecified, unspecified back pain laterality, unspecified chronicity   Discharge Instructions   None    ED Prescriptions    None     PDMP not reviewed this encounter.   Chase Picket, MD 07/19/19 1458    Chase Picket, MD 07/19/19 [phone]

## 2019-07-18 NOTE — ED Triage Notes (Signed)
Pt here for right sided lower back pain with radiation down right leg

## 2019-07-29 ENCOUNTER — Other Ambulatory Visit: Payer: Self-pay

## 2019-07-29 DIAGNOSIS — Z20822 Contact with and (suspected) exposure to covid-19: Secondary | ICD-10-CM

## 2019-07-31 ENCOUNTER — Other Ambulatory Visit: Payer: Self-pay

## 2019-07-31 ENCOUNTER — Telehealth: Payer: Self-pay | Admitting: General Practice

## 2019-07-31 ENCOUNTER — Inpatient Hospital Stay (HOSPITAL_COMMUNITY)
Admission: EM | Admit: 2019-07-31 | Discharge: 2019-08-04 | DRG: 314 | Disposition: A | Discharge status: Home / Self Care | Payer: Medicaid Other | Attending: Internal Medicine | Admitting: Internal Medicine

## 2019-07-31 ENCOUNTER — Encounter (HOSPITAL_COMMUNITY): Payer: Self-pay

## 2019-07-31 ENCOUNTER — Emergency Department (HOSPITAL_COMMUNITY): Payer: Medicaid Other

## 2019-07-31 DIAGNOSIS — Z833 Family history of diabetes mellitus: Secondary | ICD-10-CM

## 2019-07-31 DIAGNOSIS — T502X5A Adverse effect of carbonic-anhydrase inhibitors, benzothiadiazides and other diuretics, initial encounter: Secondary | ICD-10-CM | POA: Diagnosis present

## 2019-07-31 DIAGNOSIS — Z5320 Procedure and treatment not carried out because of patient's decision for unspecified reasons: Secondary | ICD-10-CM | POA: Diagnosis present

## 2019-07-31 DIAGNOSIS — I5032 Chronic diastolic (congestive) heart failure: Secondary | ICD-10-CM | POA: Diagnosis present

## 2019-07-31 DIAGNOSIS — Z86711 Personal history of pulmonary embolism: Secondary | ICD-10-CM

## 2019-07-31 DIAGNOSIS — Z8711 Personal history of peptic ulcer disease: Secondary | ICD-10-CM

## 2019-07-31 DIAGNOSIS — I2510 Atherosclerotic heart disease of native coronary artery without angina pectoris: Secondary | ICD-10-CM | POA: Diagnosis present

## 2019-07-31 DIAGNOSIS — G4733 Obstructive sleep apnea (adult) (pediatric): Secondary | ICD-10-CM | POA: Diagnosis present

## 2019-07-31 DIAGNOSIS — E875 Hyperkalemia: Secondary | ICD-10-CM | POA: Diagnosis present

## 2019-07-31 DIAGNOSIS — Z7982 Long term (current) use of aspirin: Secondary | ICD-10-CM

## 2019-07-31 DIAGNOSIS — Z8673 Personal history of transient ischemic attack (TIA), and cerebral infarction without residual deficits: Secondary | ICD-10-CM

## 2019-07-31 DIAGNOSIS — Z6841 Body Mass Index (BMI) 40.0 and over, adult: Secondary | ICD-10-CM

## 2019-07-31 DIAGNOSIS — I110 Hypertensive heart disease with heart failure: Secondary | ICD-10-CM | POA: Diagnosis present

## 2019-07-31 DIAGNOSIS — Z79891 Long term (current) use of opiate analgesic: Secondary | ICD-10-CM

## 2019-07-31 DIAGNOSIS — E1159 Type 2 diabetes mellitus with other circulatory complications: Secondary | ICD-10-CM | POA: Diagnosis present

## 2019-07-31 DIAGNOSIS — Z91018 Allergy to other foods: Secondary | ICD-10-CM

## 2019-07-31 DIAGNOSIS — N179 Acute kidney failure, unspecified: Secondary | ICD-10-CM | POA: Diagnosis present

## 2019-07-31 DIAGNOSIS — E1142 Type 2 diabetes mellitus with diabetic polyneuropathy: Secondary | ICD-10-CM | POA: Diagnosis present

## 2019-07-31 DIAGNOSIS — J039 Acute tonsillitis, unspecified: Secondary | ICD-10-CM | POA: Diagnosis present

## 2019-07-31 DIAGNOSIS — E860 Dehydration: Secondary | ICD-10-CM | POA: Diagnosis present

## 2019-07-31 DIAGNOSIS — I952 Hypotension due to drugs: Secondary | ICD-10-CM

## 2019-07-31 DIAGNOSIS — I959 Hypotension, unspecified: Principal | ICD-10-CM | POA: Diagnosis present

## 2019-07-31 DIAGNOSIS — E1165 Type 2 diabetes mellitus with hyperglycemia: Secondary | ICD-10-CM | POA: Diagnosis present

## 2019-07-31 DIAGNOSIS — Z794 Long term (current) use of insulin: Secondary | ICD-10-CM

## 2019-07-31 DIAGNOSIS — E785 Hyperlipidemia, unspecified: Secondary | ICD-10-CM | POA: Diagnosis present

## 2019-07-31 DIAGNOSIS — Z8249 Family history of ischemic heart disease and other diseases of the circulatory system: Secondary | ICD-10-CM

## 2019-07-31 DIAGNOSIS — Z955 Presence of coronary angioplasty implant and graft: Secondary | ICD-10-CM

## 2019-07-31 DIAGNOSIS — Z886 Allergy status to analgesic agent status: Secondary | ICD-10-CM

## 2019-07-31 DIAGNOSIS — E1110 Type 2 diabetes mellitus with ketoacidosis without coma: Secondary | ICD-10-CM | POA: Diagnosis present

## 2019-07-31 DIAGNOSIS — Z79899 Other long term (current) drug therapy: Secondary | ICD-10-CM

## 2019-07-31 DIAGNOSIS — Z20828 Contact with and (suspected) exposure to other viral communicable diseases: Secondary | ICD-10-CM | POA: Diagnosis present

## 2019-07-31 LAB — URINALYSIS, ROUTINE W REFLEX MICROSCOPIC
Bacteria, UA: NONE SEEN
Bilirubin Urine: NEGATIVE
Glucose, UA: >=500 mg/dL — AB
Hgb urine dipstick: NEGATIVE
Ketones, ur: NEGATIVE mg/dL
Leukocytes,Ua: NEGATIVE
Nitrite: NEGATIVE
Protein, ur: NEGATIVE mg/dL
Specific Gravity, Urine: 1.017 (ref 1.005–1.030)
pH: 5 (ref 5.0–8.0)

## 2019-07-31 LAB — I-STAT CHEM 8, ED
BUN: 38 mg/dL — ABNORMAL HIGH (ref 6–20)
Calcium, Ion: 1.27 mmol/L (ref 1.15–1.40)
Chloride: 100 mmol/L (ref 98–111)
Creatinine, Ser: 1.6 mg/dL — ABNORMAL HIGH (ref 0.61–1.24)
Glucose, Bld: 392 mg/dL — ABNORMAL HIGH (ref 70–99)
HCT: 36 % — ABNORMAL LOW (ref 39.0–52.0)
Hemoglobin: 12.2 g/dL — ABNORMAL LOW (ref 13.0–17.0)
Potassium: 6.6 mmol/L (ref 3.5–5.1)
Sodium: 130 mmol/L — ABNORMAL LOW (ref 135–145)
TCO2: 23 mmol/L (ref 22–32)

## 2019-07-31 LAB — CBC
HCT: 36.5 % — ABNORMAL LOW (ref 39.0–52.0)
HCT: 41.8 % (ref 39.0–52.0)
Hemoglobin: 11.9 g/dL — ABNORMAL LOW (ref 13.0–17.0)
Hemoglobin: 13.7 g/dL (ref 13.0–17.0)
MCH: 29.5 pg (ref 26.0–34.0)
MCH: 29.8 pg (ref 26.0–34.0)
MCHC: 32.6 g/dL (ref 30.0–36.0)
MCHC: 32.8 g/dL (ref 30.0–36.0)
MCV: 90.6 fL (ref 80.0–100.0)
MCV: 90.9 fL (ref 80.0–100.0)
Platelets: 203 10*3/uL (ref 150–400)
Platelets: 169 10*3/uL (ref 150–400)
RBC: 4.03 MIL/uL — ABNORMAL LOW (ref 4.22–5.81)
RBC: 4.6 MIL/uL (ref 4.22–5.81)
RDW: 14.4 % (ref 11.5–15.5)
RDW: 14.4 % (ref 11.5–15.5)
WBC: 10.8 10*3/uL — ABNORMAL HIGH (ref 4.0–10.5)
WBC: 8.6 10*3/uL (ref 4.0–10.5)
nRBC: 0 % (ref 0.0–0.2)
nRBC: 0 % (ref 0.0–0.2)

## 2019-07-31 LAB — BASIC METABOLIC PANEL
Anion gap: 14 (ref 5–15)
BUN: 39 mg/dL — ABNORMAL HIGH (ref 6–20)
CO2: 18 mmol/L — ABNORMAL LOW (ref 22–32)
Calcium: 9.5 mg/dL (ref 8.9–10.3)
Chloride: 97 mmol/L — ABNORMAL LOW (ref 98–111)
Creatinine, Ser: 1.75 mg/dL — ABNORMAL HIGH (ref 0.61–1.24)
GFR calc Af Amer: 50 mL/min — ABNORMAL LOW (ref >60)
GFR calc non Af Amer: 43 mL/min — ABNORMAL LOW (ref >60)
Glucose, Bld: 394 mg/dL — ABNORMAL HIGH (ref 70–99)
Potassium: 6.5 mmol/L (ref 3.5–5.1)
Sodium: 129 mmol/L — ABNORMAL LOW (ref 135–145)

## 2019-07-31 LAB — DIFFERENTIAL
Abs Immature Granulocytes: 0.05 10*3/uL (ref 0.00–0.07)
Basophils Absolute: 0 10*3/uL (ref 0.0–0.1)
Basophils Relative: 0 %
Eosinophils Absolute: 0 10*3/uL (ref 0.0–0.5)
Eosinophils Relative: 0 %
Immature Granulocytes: 1 %
Lymphocytes Relative: 12 %
Lymphs Abs: 1 10*3/uL (ref 0.7–4.0)
Monocytes Absolute: 0.2 10*3/uL (ref 0.1–1.0)
Monocytes Relative: 2 %
Neutro Abs: 7.4 10*3/uL (ref 1.7–7.7)
Neutrophils Relative %: 85 %

## 2019-07-31 LAB — HEPATIC FUNCTION PANEL
ALT: 23 U/L (ref 0–44)
AST: 22 U/L (ref 15–41)
Albumin: 3.4 g/dL — ABNORMAL LOW (ref 3.5–5.0)
Alkaline Phosphatase: 61 U/L (ref 38–126)
Bilirubin, Direct: <0.1 mg/dL (ref 0.0–0.2)
Total Bilirubin: 0.6 mg/dL (ref 0.3–1.2)
Total Protein: 6.8 g/dL (ref 6.5–8.1)

## 2019-07-31 LAB — LACTIC ACID, PLASMA
Lactic Acid, Venous: 3.4 mmol/L (ref 0.5–1.9)
Lactic Acid, Venous: 3.8 mmol/L (ref 0.5–1.9)

## 2019-07-31 LAB — LIPASE, BLOOD: Lipase: 29 U/L (ref 11–51)

## 2019-07-31 LAB — TROPONIN I (HIGH SENSITIVITY)
Troponin I (High Sensitivity): 4 ng/L (ref <18)
Troponin I (High Sensitivity): 5 ng/L (ref <18)

## 2019-07-31 LAB — CBG MONITORING, ED
Glucose-Capillary: 357 mg/dL — ABNORMAL HIGH (ref 70–99)
Glucose-Capillary: 340 mg/dL — ABNORMAL HIGH (ref 70–99)

## 2019-07-31 LAB — SARS CORONAVIRUS 2 BY RT PCR (HOSPITAL ORDER, PERFORMED IN ~~LOC~~ HOSPITAL LAB): SARS Coronavirus 2: NEGATIVE

## 2019-07-31 LAB — CORTISOL: Cortisol, Plasma: 2.5 ug/dL

## 2019-07-31 LAB — NOVEL CORONAVIRUS, NAA: SARS-CoV-2, NAA: NOT DETECTED

## 2019-07-31 LAB — BRAIN NATRIURETIC PEPTIDE: B Natriuretic Peptide: 13.5 pg/mL (ref 0.0–100.0)

## 2019-07-31 MED ORDER — SODIUM CHLORIDE 0.9 % IV BOLUS
1000.0000 mL | Freq: Once | INTRAVENOUS | Status: AC
  Administered 2019-07-31: 1000 mL via INTRAVENOUS

## 2019-07-31 MED ORDER — SODIUM CHLORIDE 0.9% FLUSH: 3.0000 mL | Freq: Once | INTRAVENOUS | Status: DC

## 2019-07-31 MED ORDER — INSULIN DETEMIR 100 UNIT/ML ~~LOC~~ SOLN
60.0000 [IU] | Freq: Every day | SUBCUTANEOUS | Status: DC
  Administered 2019-08-01: 60 [IU] via SUBCUTANEOUS
  Filled 2019-07-31: qty 0.6

## 2019-07-31 MED ORDER — INSULIN ASPART 100 UNIT/ML IV SOLN
5.0000 [IU] | Freq: Once | INTRAVENOUS | Status: AC
  Administered 2019-07-31: 5 [IU] via INTRAVENOUS

## 2019-07-31 MED ORDER — SODIUM CHLORIDE 0.9 % IV BOLUS: 1000.0000 mL | Freq: Once | INTRAVENOUS | Status: DC

## 2019-07-31 MED ORDER — IOHEXOL 350 MG/ML SOLN
100.0000 mL | Freq: Once | INTRAVENOUS | Status: AC | PRN
  Administered 2019-07-31: 100 mL via INTRAVENOUS

## 2019-07-31 MED ORDER — DEXAMETHASONE SODIUM PHOSPHATE 4 MG/ML IJ SOLN: 4.0000 mg | Freq: Four times a day (QID) | INTRAMUSCULAR | Status: DC

## 2019-07-31 MED ORDER — ALBUTEROL SULFATE HFA 108 (90 BASE) MCG/ACT IN AERS
4.0000 | INHALATION_SPRAY | Freq: Once | RESPIRATORY_TRACT | Status: AC
  Administered 2019-07-31: 4 via RESPIRATORY_TRACT
  Filled 2019-07-31: qty 6.7

## 2019-07-31 MED ORDER — SODIUM CHLORIDE 0.9 % IV SOLN
INTRAVENOUS | Status: AC
  Administered 2019-07-31: 21:00:00 via INTRAVENOUS

## 2019-07-31 MED ORDER — SODIUM ZIRCONIUM CYCLOSILICATE 10 G PO PACK
10.0000 g | PACK | Freq: Once | ORAL | Status: AC
  Administered 2019-07-31: 18:00:00 10 g via ORAL
  Filled 2019-07-31: qty 1

## 2019-07-31 MED ORDER — SODIUM ZIRCONIUM CYCLOSILICATE 5 G PO PACK: 5.0000 g | PACK | Freq: Once | ORAL | Status: DC

## 2019-07-31 NOTE — ED Triage Notes (Signed)
Patient arrived by [facility] for hypotension. Patient complains of weakness and sleepiness. Patient alert but pale. Denies any discomfort.

## 2019-07-31 NOTE — Telephone Encounter (Signed)
Patient informed of negative covid-19 result. Patient verbalized understanding.  °

## 2019-07-31 NOTE — ED Provider Notes (Signed)
[facility] EMERGENCY DEPARTMENT Provider Note   CSN: [id number] Arrival date & time: 07/31/19  1556     History   Chief Complaint Chief Complaint  Patient presents with   Hypotension    HPI [name] is a 58 y.o. male.     [name] is a 58 y.o. male with a history of CAD, CHF, PE, thoracic aortic aneurysm, diabetes, hypertension, hyperlipidemia, IBS, sleep apnea, chronic back pain, who presents to the ED for evaluation of hypotension.  Patient reports he went to his neurosurgeons office to get routine steroid injection for chronic low back pain, there he was noted to have a low blood pressure in the 80s and was sent to the ED for further evaluation.  He reports that aside from feeling a bit generally weak and fatigued he has not had any acute symptoms.  He reports weakness started yesterday.  He denies any fevers or chills.  No chest pain, shortness of breath or cough.  Patient reports that he intermittently has diarrhea associated with his IBS, has had some intermittent right-sided abdominal pains, these seem to come and go.  No associated nausea or vomiting.  Patient does report over the past few months he has had some decreased appetite, and some slight weight loss.  Patient denies any dysuria.  He denies any headache, no focal numbness or weakness.  Patient reports that his Lasix dosage was recently increased and he has not had issues with swelling in his legs since then.  History of PE but is not currently on any anticoagulation.     Past Medical History:  Diagnosis Date   Anemia    Arthritis    Back pain    CAD (coronary artery disease)    a. s/p DES to LAD in 05/2016   Cervical radiculopathy    Chronic diastolic CHF (congestive heart failure) (HCC)    Chronic pain    Depression    DVT (deep venous thrombosis) (HCC)    Hematemesis    Hepatic steatosis    Hyperlipidemia    Hypertension    IBS (irritable bowel syndrome)      Morbid obesity (HCC)    OSA (obstructive sleep apnea)    Pancreatitis    PE (pulmonary thromboembolism) ([place])    Peripheral neuropathy    PUD (peptic ulcer disease)    Renal disorder    Stroke [facility])    a. ?details unclear - not seen on imaging when he was admitted in 05/2017 for TIA symptoms which were felt due to cervical radiculopathy.   Thoracic aortic ectasia (HCC)    a. 4.3cm ectatic ascending thoracic aorta by CT 06/2017.    Type 2 diabetes mellitus [facility])     Patient Active Problem List   Diagnosis Date Noted   DKA (diabetic ketoacidoses) ([place]) 05/05/2019   Blurred vision 05/05/2019   Alteration consciousness 11/19/2018   Neck pain 10/09/2018   Left arm weakness 10/09/2018   B12 deficiency 08/10/2017   Persistent headaches 08/09/2017   GERD (gastroesophageal reflux disease) 06/29/2017   Left arm numbness    Cerebral embolism with cerebral infarction 06/17/2017   TIA (transient ischemic attack) 06/17/2017   Chronic back pain 12/29/2016   Depression 12/15/2016   Vitamin D deficiency 12/09/2016   Right hip pain 12/07/2016   Hypokalemia 12/07/2016   Type 2 diabetes mellitus with vascular disease ([place]) 05/31/2016   Normocytic normochromic anemia 05/31/2016   Chest pain 05/31/2016   CAD (coronary artery disease)  01/06/2016   DVT (deep venous thrombosis) ([place]) 01/06/2016   Lactic acidosis 05/28/2014   Nonspecific chest pain 01/29/2014   Uncontrolled secondary diabetes with peripheral neuropathy ([place]) 01/29/2014   Obesity, Class III, BMI 40-49.9 (morbid obesity) ([place]) 01/29/2014   Snoring 01/29/2014   Dyslipidemia 01/29/2014   HTN (hypertension) 01/29/2014   Abnormal nuclear stress test 01/29/2014    Past Surgical History:  Procedure Laterality Date   CARDIAC CATHETERIZATION N/A 05/31/2016   Procedure: Left Heart Cath and Coronary Angiography;  Surgeon: Peter M Martinique, MD;  Location: [place] CV LAB;  Service: Cardiovascular;   Laterality: N/A;   CARDIAC CATHETERIZATION N/A 05/31/2016   Procedure: Intravascular Pressure Wire/FFR Study;  Surgeon: Peter M Martinique, MD;  Location: [place] CV LAB;  Service: Cardiovascular;  Laterality: N/A;   CARDIAC CATHETERIZATION N/A 05/31/2016   Procedure: Coronary Stent Intervention;  Surgeon: Peter M Martinique, MD;  Location: [place] CV LAB;  Service: Cardiovascular;  Laterality: N/A;   LEFT HEART CATH AND CORONARY ANGIOGRAPHY N/A 12/08/2017   Procedure: LEFT HEART CATH AND CORONARY ANGIOGRAPHY;  Surgeon: Leonie Man, MD;  Location: [place] CV LAB;  Service: Cardiovascular;  Laterality: N/A;   LEFT HEART CATHETERIZATION WITH CORONARY ANGIOGRAM N/A 02/03/2014   Procedure: LEFT HEART CATHETERIZATION WITH CORONARY ANGIOGRAM;  Surgeon: Pixie Casino, MD;  Location[facility] CATH LAB;  Service: Cardiovascular;  Laterality: N/A;   left leg stent           Home Medications    Prior to Admission medications   Medication Sig Start Date End Date Taking? Authorizing Provider  Accu-Chek FastClix Lancets MISC CHECK BLOOD GLUCOSE 2 TIMES/DAY. DX E11.65 03/20/19   [provider]  Albuterol Sulfate (PROAIR RESPICLICK) [id number] (90 Base) MCG/ACT AEPB Inhale 2 puffs into the lungs every 6 (six) hours as needed. Patient taking differently: Inhale 2 puffs into the lungs every 6 (six) hours as needed (for breathing).  11/06/18   Robyn Haber, MD  aspirin 81 MG chewable tablet Chew 1 tablet (81 mg total) by mouth daily. 06/01/16   Elwin Mocha, MD  atorvastatin (LIPITOR) 80 MG tablet Take 1 tablet (80 mg total) by mouth daily. 01/26/17   Ahmed Prima, Fransisco Hertz, PA-C  carvedilol (COREG) 25 MG tablet Take 1 tablet (25 mg total) by mouth 2 (two) times daily with a meal. 01/26/17   Strader, [place] M, PA-C  diclofenac (FLECTOR) 1.3 % PTCH Place 1 patch onto the skin daily as needed (for pain).  12/31/18   [provider]  diclofenac sodium (VOLTAREN) 1 % GEL Apply 1 application  topically 4 (four) times daily. Patient taking differently: Apply 1 application topically 4 (four) times daily as needed (for pain).  04/05/18   Melynda Ripple, MD  DULoxetine (CYMBALTA) 60 MG capsule TAKE 1 CAPSULE BY MOUTH EVERY DAY Patient taking differently: Take 60 mg by mouth daily.  12/24/18   Marcial Pacas, MD  furosemide (LASIX) 40 MG tablet Take 40 mg by mouth 3 (three) times daily.    [provider]  glipiZIDE (GLUCOTROL) 10 MG tablet Take 10 mg by mouth 2 (two) times daily. 05/30/17   [provider]  Insulin Detemir (LEVEMIR FLEXTOUCH) 100 UNIT/ML Pen Inject 60 Units into the skin daily. 05/06/19   Black, Lezlie Octave, NP  Insulin Pen Needle 31G X 5 MM MISC Use 1 needle daily to inject insulin as prescribed 06/18/17   Barton Dubois, MD  isosorbide mononitrate (IMDUR) 60 MG 24 hr tablet Take 1  tablet (60 mg total) by mouth daily. 02/06/19   Skeet Latch, MD  lisinopril (PRINIVIL,ZESTRIL) 10 MG tablet Take 1 tablet (10 mg total) by mouth daily. 12/10/17   Velvet Bathe, MD  metFORMIN (GLUCOPHAGE-XR) 500 MG 24 hr tablet Take 2 tablets (1,000 mg total) by mouth 2 (two) times daily. 07/02/17   Barton Dubois, MD  nitroGLYCERIN (NITROSTAT) 0.4 MG SL tablet Place 1 tablet (0.4 mg total) under the tongue every 5 (five) minutes as needed for chest pain. 02/06/19   Skeet Latch, MD  NOVOLOG FLEXPEN 100 UNIT/ML FlexPen USE 3 TIMES DAILY BEFORE MEALS PER SSI 151 200 4U, 201 250 6U, 251 300 8U, 301 350 10U, 351 400 12U. 05/13/19   [provider]  oxyCODONE (ROXICODONE) 15 MG immediate release tablet Take 15 mg 3 (three) times daily as needed by mouth for pain.  08/28/17   [provider]  potassium chloride SA (K-DUR,KLOR-CON) 20 MEQ tablet Take 20-40 mEq by mouth See admin instructions. Take 2 tablets at lunch then take 1 tablet at dinner and at bedtime    [provider]  pregabalin (LYRICA) 300 MG capsule Take 300 mg by mouth 2 (two) times daily. 05/18/19    [provider]  sertraline (ZOLOFT) 50 MG tablet Take 50 mg by mouth daily.    [provider]  spironolactone (ALDACTONE) 25 MG tablet TAKE 1 TABLET (25 MG) BY ORAL ROUTE ONCE DAILY FOR 14 DAYS 06/13/19   [provider]  tiZANidine (ZANAFLEX) 2 MG tablet Take 2 mg by mouth 2 (two) times daily as needed for pain.    [provider]  traZODone (DESYREL) 50 MG tablet Take 1 tablet (50 mg total) by mouth at bedtime. 12/15/16   Florinda Marker, MD    Family History Family History  Problem Relation Age of Onset   Cancer Father    Hypertension Mother    Diabetes Mother    Breast cancer Mother    Hypertension Brother    Diabetes Brother    Hypertension Sister    Diabetes Sister     Social History Social History   Tobacco Use   Smoking status: Never Smoker   Smokeless tobacco: Never Used  Substance Use Topics   Alcohol use: No   Drug use: No     Allergies   Coconut flavor [flavoring agent], Coconut oil, Ibuprofen, Aleve [naproxen], and Nsaids   Review of Systems Review of Systems  Constitutional: Positive for fatigue. Negative for chills and fever.  HENT: Negative.   Respiratory: Negative for cough and shortness of breath.   Cardiovascular: Negative for chest pain.  Gastrointestinal: Positive for abdominal pain and diarrhea. Negative for blood in stool, nausea and vomiting.  Genitourinary: Negative for dysuria and frequency.  Musculoskeletal: Negative for arthralgias and myalgias.  Skin: Negative for color change and rash.  Neurological: Positive for weakness (generalized). Negative for dizziness, syncope and light-headedness.     Physical Exam Updated Vital Signs BP (!) 74/57    Pulse 66    Temp 97.8 F (36.6 C) (Oral)    Resp 15    SpO2 98%   Physical Exam Vitals signs and nursing note reviewed.  Constitutional:      General: He is not in acute distress.    Appearance: He is well-developed. He is obese. He is  ill-appearing. He is not toxic-appearing or diaphoretic.     Comments: Alert obese male sitting up in bed, somewhat pale and ill-appearing  HENT:  Head: Normocephalic and atraumatic.     Mouth/Throat:     Comments: Mucous membranes are dry Eyes:     General:        Right eye: No discharge.        Left eye: No discharge.     Pupils: Pupils are equal, round, and reactive to light.  Neck:     Musculoskeletal: Neck supple.  Cardiovascular:     Rate and Rhythm: Normal rate and regular rhythm.     Heart sounds: Normal heart sounds. No murmur. No friction rub. No gallop.   Pulmonary:     Effort: Pulmonary effort is normal. No respiratory distress.     Breath sounds: Normal breath sounds. No wheezing or rales.     Comments: Respirations equal and unlabored, patient able to speak in full sentences, lungs clear to auscultation bilaterally Abdominal:     General: Bowel sounds are normal. There is no distension.     Palpations: Abdomen is soft. There is no mass.     Tenderness: There is abdominal tenderness. There is no guarding.     Comments: Obese abdomen soft and nondistended, bowel sounds present throughout.  There is some tenderness in the right upper and lower quadrants without guarding or peritoneal signs.  No CVA tenderness bilaterally.  Musculoskeletal:        General: No deformity.     Right lower leg: No edema.     Left lower leg: No edema.     Comments: Bilateral lower extremities without edema.  Skin:    General: Skin is warm and dry.     Capillary Refill: Capillary refill takes less than 2 seconds.     Coloration: Skin is pale.  Neurological:     Mental Status: He is alert.     Coordination: Coordination normal.     Comments: Speech is clear, able to follow commands CN III-XII intact Normal strength in upper and lower extremities bilaterally including dorsiflexion and plantar flexion, strong and equal grip strength Sensation normal to light and sharp touch Moves  extremities without ataxia, coordination intact  Psychiatric:        Mood and Affect: Mood normal.        Behavior: Behavior normal.      ED Treatments / Results  Labs (all labs ordered are listed, but only abnormal results are displayed) Labs Reviewed  BASIC METABOLIC PANEL - Abnormal; Notable for the following components:      Result Value   Sodium 129 (*)    Potassium 6.5 (*)    Chloride 97 (*)    CO2 18 (*)    Glucose, Bld 394 (*)    BUN 39 (*)    Creatinine, Ser 1.75 (*)    GFR calc non Af Amer 43 (*)    GFR calc Af Amer 50 (*)    All other components within normal limits  CBC - Abnormal; Notable for the following components:   WBC 10.8 (*)    RBC 4.03 (*)    Hemoglobin 11.9 (*)    HCT 36.5 (*)    All other components within normal limits  CBG MONITORING, ED - Abnormal; Notable for the following components:   Glucose-Capillary 357 (*)    All other components within normal limits  I-STAT CHEM 8, ED - Abnormal; Notable for the following components:   Sodium 130 (*)    Potassium 6.6 (*)    BUN 38 (*)    Creatinine, Ser 1.60 (*)  Glucose, Bld 392 (*)    Hemoglobin 12.2 (*)    HCT 36.0 (*)    All other components within normal limits  CULTURE, BLOOD (ROUTINE X 2)  CULTURE, BLOOD (ROUTINE X 2)  SARS CORONAVIRUS 2 (HOSPITAL ORDER, [place] [facility])  DIFFERENTIAL  CBC  LACTIC ACID, PLASMA  LACTIC ACID, PLASMA  URINALYSIS, ROUTINE W REFLEX MICROSCOPIC  HEPATIC FUNCTION PANEL  LIPASE, BLOOD  BRAIN NATRIURETIC PEPTIDE  TROPONIN I (HIGH SENSITIVITY)    EKG EKG Interpretation  Date/Time:  Wednesday July 31 2019 16:06:22 EDT Ventricular Rate:  69 PR Interval:  170 QRS Duration: 76 QT Interval:  360 QTC Calculation: 385 R Axis:   51 Text Interpretation:  Normal sinus rhythm Normal ECG No significant change since last tracing Confirmed by Wandra Arthurs [phone]) on 07/31/2019 5:27:10 PM   Radiology Ct Angio Chest Pe W And/or Wo  Contrast  Result Date: 07/31/2019 CLINICAL DATA:  Concern for pulmonary embolism.  Weakness. EXAM: CT ANGIOGRAPHY CHEST CT ABDOMEN AND PELVIS WITH CONTRAST TECHNIQUE: Multidetector CT imaging of the chest was performed using the standard protocol during bolus administration of intravenous contrast. Multiplanar CT image reconstructions and MIPs were obtained to evaluate the vascular anatomy. Multidetector CT imaging of the abdomen and pelvis was performed using the standard protocol during bolus administration of intravenous contrast. CONTRAST:  19mL OMNIPAQUE IOHEXOL 350 MG/ML SOLN COMPARISON:  Chest CT 07/03/2018 FINDINGS: CTA CHEST FINDINGS Cardiovascular: No filling defects within the pulmonary arteries to localize acute pulmonary embolism. There is image degradation in the lower lobe pulmonary arteries secondary to patient body habitus with mottled streak artifact. No RIGHT ventricular strain. Mediastinum/Nodes: No axillary supraclavicular adenopathy. No mediastinal hilar adenopathy. No pericardial effusion. Esophagus normal. Lungs/Pleura: No pulmonary infarction. No airspace disease. No atelectasis. No effusion. Is Musculoskeletal: No rib fracture. Review of the MIP images confirms the above findings. CT ABDOMEN and PELVIS FINDINGS Hepatobiliary: Peripheral regions of hypo attenuation/hypoperfusion in the LEFT and RIGHT hepatic lobes. These ill-defined regions are seen on axial image 20/6 for example. No enhancing lesion. No biliary duct dilatation. Gallbladder normal. Pancreas: Normal pancreas Spleen: Low-density lesion in the spleen measures 3.6 cm. This is unchanged from CT 2018. Adrenals/Urinary Tract: Adrenal glands normal. Kidneys enhance uniformly ureters and bladder normal. Stomach/Bowel: Stomach, small bowel appendix and cecum normal. The colon and rectum sigmoid colon normal. Vascular/Lymphatic: Abdominal aorta is normal caliber. There is no retroperitoneal or periportal lymphadenopathy. No pelvic  lymphadenopathy. Reproductive: Prostate normal. Other: No free fluid. Musculoskeletal: No aggressive osseous lesion. Review of the MIP images confirms the above findings. IMPRESSION: Chest Impression: 1. No evidence acute pulmonary embolism. 2. No acute pulmonary parenchymal findings. Abdomen / Pelvis Impression: 1. Hypoperfusion in the periphery of the LEFT and RIGHT hepatic lobe is nonspecific finding and may represent a benign vascular phenomena. Consider serologic correlation for hepatitis. Abdominal/hepatic MRI could potentially help characterize hepatic abnormality 2. Hypoperfusion in the spleen unchanged from 2018 likely represents a hemangioma or cyst . Electronically Signed   By: Suzy Bouchard M.D.   On: 07/31/2019 19:04   Ct Abdomen Pelvis W Contrast  Result Date: 07/31/2019 CLINICAL DATA:  Concern for pulmonary embolism.  Weakness. EXAM: CT ANGIOGRAPHY CHEST CT ABDOMEN AND PELVIS WITH CONTRAST TECHNIQUE: Multidetector CT imaging of the chest was performed using the standard protocol during bolus administration of intravenous contrast. Multiplanar CT image reconstructions and MIPs were obtained to evaluate the vascular anatomy. Multidetector CT imaging of the abdomen and pelvis was performed  using the standard protocol during bolus administration of intravenous contrast. CONTRAST:  173mL OMNIPAQUE IOHEXOL 350 MG/ML SOLN COMPARISON:  Chest CT 07/03/2018 FINDINGS: CTA CHEST FINDINGS Cardiovascular: No filling defects within the pulmonary arteries to localize acute pulmonary embolism. There is image degradation in the lower lobe pulmonary arteries secondary to patient body habitus with mottled streak artifact. No RIGHT ventricular strain. Mediastinum/Nodes: No axillary supraclavicular adenopathy. No mediastinal hilar adenopathy. No pericardial effusion. Esophagus normal. Lungs/Pleura: No pulmonary infarction. No airspace disease. No atelectasis. No effusion. Is Musculoskeletal: No rib fracture. Review  of the MIP images confirms the above findings. CT ABDOMEN and PELVIS FINDINGS Hepatobiliary: Peripheral regions of hypo attenuation/hypoperfusion in the LEFT and RIGHT hepatic lobes. These ill-defined regions are seen on axial image 20/6 for example. No enhancing lesion. No biliary duct dilatation. Gallbladder normal. Pancreas: Normal pancreas Spleen: Low-density lesion in the spleen measures 3.6 cm. This is unchanged from CT 2018. Adrenals/Urinary Tract: Adrenal glands normal. Kidneys enhance uniformly ureters and bladder normal. Stomach/Bowel: Stomach, small bowel appendix and cecum normal. The colon and rectum sigmoid colon normal. Vascular/Lymphatic: Abdominal aorta is normal caliber. There is no retroperitoneal or periportal lymphadenopathy. No pelvic lymphadenopathy. Reproductive: Prostate normal. Other: No free fluid. Musculoskeletal: No aggressive osseous lesion. Review of the MIP images confirms the above findings. IMPRESSION: Chest Impression: 1. No evidence acute pulmonary embolism. 2. No acute pulmonary parenchymal findings. Abdomen / Pelvis Impression: 1. Hypoperfusion in the periphery of the LEFT and RIGHT hepatic lobe is nonspecific finding and may represent a benign vascular phenomena. Consider serologic correlation for hepatitis. Abdominal/hepatic MRI could potentially help characterize hepatic abnormality 2. Hypoperfusion in the spleen unchanged from 2018 likely represents a hemangioma or cyst . Electronically Signed   By: Suzy Bouchard M.D.   On: 07/31/2019 19:04   Dg Chest Port 1 View  Result Date: 07/31/2019 CLINICAL DATA:  Weakness, sleepiness EXAM: PORTABLE CHEST 1 VIEW COMPARISON:  06/05/2019 FINDINGS: The heart size and mediastinal contours are within normal limits. Both lungs are clear. The visualized skeletal structures are unremarkable. IMPRESSION: No active disease. Electronically Signed   By: Kathreen Devoid   On: 07/31/2019 17:07    Procedures .Critical Care Performed by:  Jacqlyn Larsen, PA-C Authorized by: Jacqlyn Larsen, PA-C   Critical care provider statement:    Critical care time (minutes):  45   Critical care was necessary to treat or prevent imminent or life-threatening deterioration of the following conditions:  Renal failure and metabolic crisis (AKI with hyperkalemia, hypotensive requiring multiple fluid boluses)   Critical care was time spent personally by me on the following activities:  Discussions with consultants, evaluation of patient's response to treatment, examination of patient, ordering and performing treatments and interventions, ordering and review of laboratory studies, ordering and review of radiographic studies, pulse oximetry, re-evaluation of patient's condition, obtaining history from patient or surrogate and review of old charts Ultrasound ED Peripheral IV (Provider)  Date/Time: 07/31/2019 9:16 PM Performed by: Jacqlyn Larsen, PA-C Authorized by: Jacqlyn Larsen, PA-C   Procedure details:    Indications: hydration and hypotension     Skin Prep: isopropyl alcohol     Location:  Left anterior forearm   Angiocath:  20 G   Bedside Ultrasound Guided: Yes     Images: not archived     Patient tolerated procedure without complications: Yes     Dressing applied: Yes     (including critical care time)  Medications Ordered in ED Medications  sodium chloride flush (  NS) 0.9 % injection 3 mL (3 mLs Intravenous Not Given 07/31/19 1658)  0.9 %  sodium chloride infusion ( Intravenous New Bag/Given 07/31/19 2107)  sodium chloride 0.9 % bolus 1,000 mL (1,000 mLs Intravenous New Bag/Given 07/31/19 1636)  iohexol (OMNIPAQUE) 350 MG/ML injection 100 mL (100 mLs Intravenous Contrast Given 07/31/19 1740)  sodium chloride 0.9 % bolus 1,000 mL (1,000 mLs Intravenous New Bag/Given 07/31/19 1817)  insulin aspart (novoLOG) injection 5 Units (5 Units Intravenous Given 07/31/19 1813)  albuterol (VENTOLIN HFA) 108 (90 Base) MCG/ACT inhaler 4 puff (4 puffs  Inhalation Given 07/31/19 1814)  sodium zirconium cyclosilicate (LOKELMA) packet 10 g (10 g Oral Given 07/31/19 1814)     Initial Impression / Assessment and Plan / ED Course  I have reviewed the triage vital signs and the nursing notes.  Pertinent labs & imaging results that were available during my care of the patient were reviewed by me and considered in my medical decision making (see chart for details).  58 year old male presents for evaluation of hypotension, on arrival blood pressure of 79/33 but patient is alert, able to stand and verbally responsive.  Vitals otherwise normal.  Patient without fevers or focal infectious symptoms.  Reports feeling generally weak and fatigued starting yesterday.  Was seen at neurosurgeons office for spinal steroid injection which he received, blood pressure noted to be low and was sent to ED for further evaluation.  Patient is somewhat pale but is alert.  Reports that he has had some intermittent right-sided abdominal pain but denies any other focal discomfort.  Has not had any chest pain or shortness of breath today.  No coughing or fever.  No vomiting.  Intermittent nonbloody diarrhea associated with IBS.  Denies any urinary symptoms.  No neurologic symptoms.  Given nonspecific symptoms with hypotension will initiate broad work-up including lactic acid, blood cultures, CBC and CMP to assess for infectious source as well as chest x-ray and urinalysis, will also check troponin and BNP, patient does not appear significantly fluid overloaded.  Given abdominal pain will get CT abdomen pelvis.  Patient history of PE as well and is not currently on any anticoagulation, will check CT PE study.  1 L IV fluid bolus given for hypotension.  Will continue to monitor blood pressure closely.  In talking with patient more he reports Lasix were recently increased to 40 3 times daily in the last month and he has had decreased appetite with poor p.o. intake, labs show acute AKI,  creatinine of 1.75, typically 0.6-0.8 and BUN of 39 with associated hyperkalemia of 6.5, CO2 of 18, sodium of 129.  Glucose elevated at 394, but no anion gap to suggest DKA.  I suspect patient has AKI of prerenal etiology given elevated BUN, suspect there may be some overdiuresis in the setting of poor p.o. intake causing worsening renal function.  Patient also is taking potassium tablets at home.  No leukocytosis and normal hemoglobin.  Normal troponin and BNP.  Chest x-ray is clear.  Lactic acid elevated at 3.4, but again I have lower suspicion for infectious etiology and suspect this is due to dehydration.  Urinalysis without signs of infection.  CT PE study is negative, CT of the abdomen and pelvis shows some hypoperfusion of the spleen which is unchanged from 2018, as well as some hyperperfusion, likely benign vascular phenomenon per radiology.  After 2 L of IV fluids blood pressure significantly improved.  Patient given albuterol, IV insulin, and Lokelma for elevated potassium.  EKG  without any changes associated with hyperkalemia, so did not give calcium.  Will discuss with hospitalist for admission.  Case discussed with Dr. Hal Hope with [facility] hospitalist who will see and admit the patient.  Given recent steroid injections request cortisol level added on to assess for adrenal crisis.  Patient discussed with Dr. Darl Householder, who saw patient as well and agrees with plan.   Final Clinical Impressions(s) / ED Diagnoses   Final diagnoses:  Hypotension, unspecified hypotension type    ED Discharge Orders    None       Jacqlyn Larsen, [place] 08/01/19 0030    Drenda Freeze, MD 08/01/19 1531

## 2019-08-01 ENCOUNTER — Encounter (HOSPITAL_COMMUNITY): Payer: Self-pay | Admitting: Internal Medicine

## 2019-08-01 DIAGNOSIS — Z8711 Personal history of peptic ulcer disease: Secondary | ICD-10-CM | POA: Diagnosis not present

## 2019-08-01 DIAGNOSIS — Z833 Family history of diabetes mellitus: Secondary | ICD-10-CM | POA: Diagnosis not present

## 2019-08-01 DIAGNOSIS — Z79891 Long term (current) use of opiate analgesic: Secondary | ICD-10-CM | POA: Diagnosis not present

## 2019-08-01 DIAGNOSIS — Z20828 Contact with and (suspected) exposure to other viral communicable diseases: Secondary | ICD-10-CM | POA: Diagnosis not present

## 2019-08-01 DIAGNOSIS — Z86711 Personal history of pulmonary embolism: Secondary | ICD-10-CM | POA: Diagnosis not present

## 2019-08-01 DIAGNOSIS — E1165 Type 2 diabetes mellitus with hyperglycemia: Secondary | ICD-10-CM | POA: Diagnosis not present

## 2019-08-01 DIAGNOSIS — E1110 Type 2 diabetes mellitus with ketoacidosis without coma: Secondary | ICD-10-CM | POA: Diagnosis not present

## 2019-08-01 DIAGNOSIS — Z8249 Family history of ischemic heart disease and other diseases of the circulatory system: Secondary | ICD-10-CM | POA: Diagnosis not present

## 2019-08-01 DIAGNOSIS — I952 Hypotension due to drugs: Secondary | ICD-10-CM

## 2019-08-01 DIAGNOSIS — E875 Hyperkalemia: Secondary | ICD-10-CM | POA: Diagnosis present

## 2019-08-01 DIAGNOSIS — G4733 Obstructive sleep apnea (adult) (pediatric): Secondary | ICD-10-CM | POA: Diagnosis not present

## 2019-08-01 DIAGNOSIS — I110 Hypertensive heart disease with heart failure: Secondary | ICD-10-CM | POA: Diagnosis not present

## 2019-08-01 DIAGNOSIS — I2510 Atherosclerotic heart disease of native coronary artery without angina pectoris: Secondary | ICD-10-CM | POA: Diagnosis not present

## 2019-08-01 DIAGNOSIS — E860 Dehydration: Secondary | ICD-10-CM | POA: Diagnosis not present

## 2019-08-01 DIAGNOSIS — J039 Acute tonsillitis, unspecified: Secondary | ICD-10-CM | POA: Diagnosis present

## 2019-08-01 DIAGNOSIS — Z79899 Other long term (current) drug therapy: Secondary | ICD-10-CM | POA: Diagnosis not present

## 2019-08-01 DIAGNOSIS — E785 Hyperlipidemia, unspecified: Secondary | ICD-10-CM | POA: Diagnosis present

## 2019-08-01 DIAGNOSIS — Z7982 Long term (current) use of aspirin: Secondary | ICD-10-CM | POA: Diagnosis not present

## 2019-08-01 DIAGNOSIS — N179 Acute kidney failure, unspecified: Secondary | ICD-10-CM | POA: Insufficient documentation

## 2019-08-01 DIAGNOSIS — Z794 Long term (current) use of insulin: Secondary | ICD-10-CM | POA: Diagnosis not present

## 2019-08-01 DIAGNOSIS — Z955 Presence of coronary angioplasty implant and graft: Secondary | ICD-10-CM | POA: Diagnosis not present

## 2019-08-01 DIAGNOSIS — I959 Hypotension, unspecified: Secondary | ICD-10-CM | POA: Diagnosis not present

## 2019-08-01 DIAGNOSIS — Z6841 Body Mass Index (BMI) 40.0 and over, adult: Secondary | ICD-10-CM | POA: Diagnosis not present

## 2019-08-01 DIAGNOSIS — Z886 Allergy status to analgesic agent status: Secondary | ICD-10-CM | POA: Diagnosis not present

## 2019-08-01 DIAGNOSIS — I5032 Chronic diastolic (congestive) heart failure: Secondary | ICD-10-CM | POA: Diagnosis not present

## 2019-08-01 LAB — CBC
HCT: 36.1 % — ABNORMAL LOW (ref 39.0–52.0)
Hemoglobin: 11.9 g/dL — ABNORMAL LOW (ref 13.0–17.0)
MCH: 29.6 pg (ref 26.0–34.0)
MCHC: 33 g/dL (ref 30.0–36.0)
MCV: 89.8 fL (ref 80.0–100.0)
Platelets: 225 10*3/uL (ref 150–400)
RBC: 4.02 MIL/uL — ABNORMAL LOW (ref 4.22–5.81)
RDW: 14.3 % (ref 11.5–15.5)
WBC: 14.3 10*3/uL — ABNORMAL HIGH (ref 4.0–10.5)
nRBC: 0 % (ref 0.0–0.2)

## 2019-08-01 LAB — CBG MONITORING, ED
Glucose-Capillary: 144 mg/dL — ABNORMAL HIGH (ref 70–99)
Glucose-Capillary: 158 mg/dL — ABNORMAL HIGH (ref 70–99)
Glucose-Capillary: 200 mg/dL — ABNORMAL HIGH (ref 70–99)
Glucose-Capillary: 206 mg/dL — ABNORMAL HIGH (ref 70–99)
Glucose-Capillary: 214 mg/dL — ABNORMAL HIGH (ref 70–99)
Glucose-Capillary: 228 mg/dL — ABNORMAL HIGH (ref 70–99)
Glucose-Capillary: 235 mg/dL — ABNORMAL HIGH (ref 70–99)
Glucose-Capillary: 244 mg/dL — ABNORMAL HIGH (ref 70–99)
Glucose-Capillary: 271 mg/dL — ABNORMAL HIGH (ref 70–99)
Glucose-Capillary: 346 mg/dL — ABNORMAL HIGH (ref 70–99)
Glucose-Capillary: 356 mg/dL — ABNORMAL HIGH (ref 70–99)
Glucose-Capillary: 410 mg/dL — ABNORMAL HIGH (ref 70–99)
Glucose-Capillary: 455 mg/dL — ABNORMAL HIGH (ref 70–99)

## 2019-08-01 LAB — BASIC METABOLIC PANEL
Anion gap: 16 — ABNORMAL HIGH (ref 5–15)
Anion gap: 12 (ref 5–15)
Anion gap: 12 (ref 5–15)
Anion gap: 12 (ref 5–15)
Anion gap: 12 (ref 5–15)
BUN: 33 mg/dL — ABNORMAL HIGH (ref 6–20)
BUN: 33 mg/dL — ABNORMAL HIGH (ref 6–20)
BUN: 32 mg/dL — ABNORMAL HIGH (ref 6–20)
BUN: 29 mg/dL — ABNORMAL HIGH (ref 6–20)
BUN: 28 mg/dL — ABNORMAL HIGH (ref 6–20)
CO2: 16 mmol/L — ABNORMAL LOW (ref 22–32)
CO2: 19 mmol/L — ABNORMAL LOW (ref 22–32)
CO2: 19 mmol/L — ABNORMAL LOW (ref 22–32)
CO2: 24 mmol/L (ref 22–32)
CO2: 23 mmol/L (ref 22–32)
Calcium: 9.1 mg/dL (ref 8.9–10.3)
Calcium: 9.5 mg/dL (ref 8.9–10.3)
Calcium: 9.3 mg/dL (ref 8.9–10.3)
Calcium: 9.8 mg/dL (ref 8.9–10.3)
Calcium: 9.4 mg/dL (ref 8.9–10.3)
Chloride: 98 mmol/L (ref 98–111)
Chloride: 101 mmol/L (ref 98–111)
Chloride: 104 mmol/L (ref 98–111)
Chloride: 101 mmol/L (ref 98–111)
Chloride: 99 mmol/L (ref 98–111)
Creatinine, Ser: 1.56 mg/dL — ABNORMAL HIGH (ref 0.61–1.24)
Creatinine, Ser: 1.45 mg/dL — ABNORMAL HIGH (ref 0.61–1.24)
Creatinine, Ser: 1.32 mg/dL — ABNORMAL HIGH (ref 0.61–1.24)
Creatinine, Ser: 1.25 mg/dL — ABNORMAL HIGH (ref 0.61–1.24)
Creatinine, Ser: 1.04 mg/dL (ref 0.61–1.24)
GFR calc Af Amer: 57 mL/min — ABNORMAL LOW (ref >60)
GFR calc Af Amer: >60 mL/min (ref >60)
GFR calc Af Amer: >60 mL/min (ref >60)
GFR calc Af Amer: >60 mL/min (ref >60)
GFR calc Af Amer: >60 mL/min (ref >60)
GFR calc non Af Amer: 49 mL/min — ABNORMAL LOW (ref >60)
GFR calc non Af Amer: 54 mL/min — ABNORMAL LOW (ref >60)
GFR calc non Af Amer: >60 mL/min (ref >60)
GFR calc non Af Amer: >60 mL/min (ref >60)
GFR calc non Af Amer: >60 mL/min (ref >60)
Glucose, Bld: 510 mg/dL (ref 70–99)
Glucose, Bld: 370 mg/dL — ABNORMAL HIGH (ref 70–99)
Glucose, Bld: 229 mg/dL — ABNORMAL HIGH (ref 70–99)
Glucose, Bld: 165 mg/dL — ABNORMAL HIGH (ref 70–99)
Glucose, Bld: 142 mg/dL — ABNORMAL HIGH (ref 70–99)
Potassium: 7 mmol/L (ref 3.5–5.1)
Potassium: 6.5 mmol/L (ref 3.5–5.1)
Potassium: 5.5 mmol/L — ABNORMAL HIGH (ref 3.5–5.1)
Potassium: 5 mmol/L (ref 3.5–5.1)
Potassium: 5.9 mmol/L — ABNORMAL HIGH (ref 3.5–5.1)
Sodium: 130 mmol/L — ABNORMAL LOW (ref 135–145)
Sodium: 132 mmol/L — ABNORMAL LOW (ref 135–145)
Sodium: 135 mmol/L (ref 135–145)
Sodium: 137 mmol/L (ref 135–145)
Sodium: 134 mmol/L — ABNORMAL LOW (ref 135–145)

## 2019-08-01 LAB — GLUCOSE, CAPILLARY
Glucose-Capillary: 152 mg/dL — ABNORMAL HIGH (ref 70–99)
Glucose-Capillary: 218 mg/dL — ABNORMAL HIGH (ref 70–99)
Glucose-Capillary: 224 mg/dL — ABNORMAL HIGH (ref 70–99)
Glucose-Capillary: 230 mg/dL — ABNORMAL HIGH (ref 70–99)
Glucose-Capillary: 231 mg/dL — ABNORMAL HIGH (ref 70–99)
Glucose-Capillary: 265 mg/dL — ABNORMAL HIGH (ref 70–99)
Glucose-Capillary: 70 mg/dL (ref 70–99)

## 2019-08-01 LAB — LACTIC ACID, PLASMA: Lactic Acid, Venous: 3.1 mmol/L (ref 0.5–1.9)

## 2019-08-01 LAB — CORTISOL: Cortisol, Plasma: 0.8 ug/dL

## 2019-08-01 MED ORDER — DEXTROSE 50 % IV SOLN
12.0000 mL | Freq: Once | INTRAVENOUS | Status: AC
  Administered 2019-08-01: 17:00:00 12 mL via INTRAVENOUS

## 2019-08-01 MED ORDER — ALBUTEROL SULFATE (2.5 MG/3ML) 0.083% IN NEBU: 2.5000 mg | INHALATION_SOLUTION | Freq: Four times a day (QID) | RESPIRATORY_TRACT | Status: DC | PRN

## 2019-08-01 MED ORDER — ONDANSETRON HCL 4 MG PO TABS: 4.0000 mg | ORAL_TABLET | Freq: Four times a day (QID) | ORAL | Status: DC | PRN

## 2019-08-01 MED ORDER — DULOXETINE HCL 60 MG PO CPEP
60.0000 mg | ORAL_CAPSULE | Freq: Every day | ORAL | Status: DC
  Administered 2019-08-01 – 2019-08-04 (×4): 60 mg via ORAL
  Filled 2019-08-01 (×4): qty 1

## 2019-08-01 MED ORDER — PREGABALIN 100 MG PO CAPS
300.0000 mg | ORAL_CAPSULE | Freq: Two times a day (BID) | ORAL | Status: DC
  Administered 2019-08-01 – 2019-08-04 (×8): 300 mg via ORAL
  Filled 2019-08-01 (×8): qty 3

## 2019-08-01 MED ORDER — DEXTROSE 50 % IV SOLN: 25.0000 mL | INTRAVENOUS | Status: DC | PRN

## 2019-08-01 MED ORDER — CALCIUM GLUCONATE 10 % IV SOLN
1.0000 g | Freq: Once | INTRAVENOUS | Status: AC
  Administered 2019-08-01: 1 g via INTRAVENOUS
  Filled 2019-08-01: qty 10

## 2019-08-01 MED ORDER — INSULIN REGULAR BOLUS VIA INFUSION
0.0000 [IU] | Freq: Three times a day (TID) | INTRAVENOUS | Status: DC
  Filled 2019-08-01: qty 10

## 2019-08-01 MED ORDER — INSULIN ASPART 100 UNIT/ML ~~LOC~~ SOLN: 0.0000 [IU] | SUBCUTANEOUS | Status: DC

## 2019-08-01 MED ORDER — ACETAMINOPHEN 650 MG RE SUPP: 650.0000 mg | Freq: Four times a day (QID) | RECTAL | Status: DC | PRN

## 2019-08-01 MED ORDER — INSULIN DETEMIR 100 UNIT/ML ~~LOC~~ SOLN
50.0000 [IU] | Freq: Two times a day (BID) | SUBCUTANEOUS | Status: DC
  Filled 2019-08-01 (×2): qty 0.5

## 2019-08-01 MED ORDER — INSULIN REGULAR(HUMAN) IN NACL 100-0.9 UT/100ML-% IV SOLN
INTRAVENOUS | Status: AC
  Filled 2019-08-01: qty 100

## 2019-08-01 MED ORDER — TIZANIDINE HCL 4 MG PO TABS: 2.0000 mg | ORAL_TABLET | Freq: Two times a day (BID) | ORAL | Status: DC | PRN

## 2019-08-01 MED ORDER — SODIUM CHLORIDE 0.9 % IV SOLN
INTRAVENOUS | Status: DC
  Administered 2019-08-01: 03:00:00 via INTRAVENOUS

## 2019-08-01 MED ORDER — CALCIUM CHLORIDE 10 % IV SOLN
INTRAVENOUS | Status: AC
  Filled 2019-08-01: qty 10

## 2019-08-01 MED ORDER — ENOXAPARIN SODIUM 40 MG/0.4ML ~~LOC~~ SOLN
40.0000 mg | Freq: Every day | SUBCUTANEOUS | Status: DC
  Administered 2019-08-01 – 2019-08-03 (×3): 40 mg via SUBCUTANEOUS
  Filled 2019-08-01 (×4): qty 0.4

## 2019-08-01 MED ORDER — TRAZODONE HCL 50 MG PO TABS
50.0000 mg | ORAL_TABLET | Freq: Every day | ORAL | Status: DC
  Administered 2019-08-01 – 2019-08-03 (×4): 50 mg via ORAL
  Filled 2019-08-01 (×4): qty 1

## 2019-08-01 MED ORDER — SERTRALINE HCL 50 MG PO TABS
50.0000 mg | ORAL_TABLET | Freq: Every day | ORAL | Status: DC
  Administered 2019-08-01 – 2019-08-04 (×4): 50 mg via ORAL
  Filled 2019-08-01 (×4): qty 1

## 2019-08-01 MED ORDER — DEXTROSE-NACL 5-0.45 % IV SOLN
INTRAVENOUS | Status: DC
  Administered 2019-08-01: 12:00:00 via INTRAVENOUS

## 2019-08-01 MED ORDER — INSULIN ASPART 100 UNIT/ML ~~LOC~~ SOLN
10.0000 [IU] | Freq: Once | SUBCUTANEOUS | Status: AC
  Administered 2019-08-01: 10 [IU] via INTRAVENOUS

## 2019-08-01 MED ORDER — ATORVASTATIN CALCIUM 80 MG PO TABS
80.0000 mg | ORAL_TABLET | Freq: Every day | ORAL | Status: DC
  Administered 2019-08-01 – 2019-08-04 (×4): 80 mg via ORAL
  Filled 2019-08-01 (×4): qty 1

## 2019-08-01 MED ORDER — DEXTROSE 50 % IV SOLN
INTRAVENOUS | Status: AC
  Administered 2019-08-01: 17:00:00 12 mL via INTRAVENOUS
  Filled 2019-08-01: qty 50

## 2019-08-01 MED ORDER — OXYCODONE HCL 5 MG PO TABS
15.0000 mg | ORAL_TABLET | Freq: Three times a day (TID) | ORAL | Status: DC | PRN
  Administered 2019-08-02 – 2019-08-04 (×5): 15 mg via ORAL
  Filled 2019-08-01 (×6): qty 3

## 2019-08-01 MED ORDER — ACETAMINOPHEN 325 MG PO TABS
650.0000 mg | ORAL_TABLET | Freq: Four times a day (QID) | ORAL | Status: DC | PRN
  Administered 2019-08-04: 650 mg via ORAL
  Filled 2019-08-01: qty 2

## 2019-08-01 MED ORDER — SODIUM ZIRCONIUM CYCLOSILICATE 5 G PO PACK
5.0000 g | PACK | Freq: Once | ORAL | Status: AC
  Administered 2019-08-01: 5 g via ORAL
  Filled 2019-08-01: qty 1

## 2019-08-01 MED ORDER — INSULIN REGULAR(HUMAN) IN NACL 100-0.9 UT/100ML-% IV SOLN
INTRAVENOUS | Status: DC
  Administered 2019-08-01: 7.5 [IU]/h via INTRAVENOUS
  Administered 2019-08-01: 14 [IU]/h via INTRAVENOUS
  Administered 2019-08-01: 3.5 [IU]/h via INTRAVENOUS
  Administered 2019-08-01: 8.2 [IU]/h via INTRAVENOUS
  Administered 2019-08-01: 9.8 [IU]/h via INTRAVENOUS
  Administered 2019-08-01: 7.9 [IU]/h via INTRAVENOUS
  Administered 2019-08-02: 7 [IU]/h via INTRAVENOUS
  Administered 2019-08-02: 6.4 [IU]/h via INTRAVENOUS
  Administered 2019-08-02: 7.8 [IU]/h via INTRAVENOUS
  Administered 2019-08-02: 9.8 [IU]/h via INTRAVENOUS
  Administered 2019-08-02: 5.4 [IU]/h via INTRAVENOUS
  Administered 2019-08-02: 4 [IU]/h via INTRAVENOUS
  Administered 2019-08-02: 10 [IU]/h via INTRAVENOUS
  Administered 2019-08-02: 7.8 [IU]/h via INTRAVENOUS
  Administered 2019-08-02: 4.7 [IU]/h via INTRAVENOUS
  Administered 2019-08-02: 5.4 [IU]/h via INTRAVENOUS
  Administered 2019-08-02: 11.9 [IU]/h via INTRAVENOUS
  Filled 2019-08-01 (×2): qty 100

## 2019-08-01 MED ORDER — ASPIRIN 81 MG PO CHEW
81.0000 mg | CHEWABLE_TABLET | Freq: Every day | ORAL | Status: DC
  Administered 2019-08-01 – 2019-08-04 (×4): 81 mg via ORAL
  Filled 2019-08-01 (×4): qty 1

## 2019-08-01 MED ORDER — ONDANSETRON HCL 4 MG/2ML IJ SOLN: 4.0000 mg | Freq: Four times a day (QID) | INTRAMUSCULAR | Status: DC | PRN

## 2019-08-01 NOTE — Progress Notes (Signed)
Inpatient Diabetes Program Recommendations  AACE/ADA: New Consensus Statement on Inpatient Glycemic Control (2015)  Target Ranges:  Prepandial:   less than 140 mg/dL      Peak postprandial:   less than 180 mg/dL (1-2 hours)      Critically ill patients:  140 - 180 mg/dL   Lab Results  Component Value Date   GLUCAP 235 (H) 08/01/2019   HGBA1C 10.5 (H) 05/05/2019    Review of Glycemic Control  Diabetes history: DM 2 Outpatient Diabetes medications: Levemir 60 units qhs, Novolog 16-22 units tid, Glipizide 10 mg bid, Metformin 1000 mg bid Current orders for Inpatient glycemic control: IV insulin gtt  Inpatient Diabetes Program Recommendations:    Pt with steroid injection in back with hypotension and hyperglycemia. Pt is requiring at least 6 units/hour (currently 14 units/hour) on the IV insulin gtt. BMET close to closing acidosis.  May want to leave pt on IV insulin until late this afternoon/evening (until basal insulin dose). Pt will need his home dose at least when transitioning off IV insulin. Suspect pt will require more after his steroid injection.  Consider:   -   then Levemir 50 units BID starting tomorrow 10/9   -  Novolog 0-20 units + hs  -  Novolog 10 units tid meal coverage (may need to increase based on home doses).  Spoke with Dr. Eliseo Squires and discussed pt plan of care.  Thanks,  Tama Headings RN, MSN, BC-ADM Inpatient Diabetes Coordinator Team Pager [phone] (8a-5p)

## 2019-08-01 NOTE — H&P (Addendum)
History and Physical    [name] [id number] DOB: [birth year] DOA: 07/31/2019  PCP: [name], FNP  Patient coming from: Home.  Chief Complaint: Low blood pressure.  HPI: [name] is a 58 y.o. male with history of CAD status post stenting, diastolic dysfunction last EF measured in June 2020 was 49 to 55%, diabetes mellitus type 2, hypertension, chronic back pain was brought in from the patient's pain clinic after patient received steroid shot in the low back and was found to be hypotensive.  Patient states over the last few days patient has been getting very weak and nauseated but has not had any nausea or vomiting and has not been eating well.  No diarrhea.  Has been having chest pain off and on lasting for few seconds each time.  No relation to exertion.  Patient states about 3 weeks ago he was started on a new medication called spironolactone.  Patient was brought to the ER by ambulance.  ED Course: In the ER patient had a blood pressure systolic of 80 systolic with a lactate of 3.8 patient was given 2 L normal saline bolus following which blood pressure improved.  Troponin was 4.  Cortisol level was 2.5.  Sodium 130 potassium 6.6 glucose 392 creatinine 1.6 bicarb 23 anion gap of 7 initially.  UA is unremarkable.  BNP 13.5.  Patient had CT abdomen and pelvis and CT angiogram of the chest which were unremarkable showing nonspecific findings.  For the hyperkalemia patient was given IV insulin Lokelma calcium gluconate.  EKG showed normal sinus rhythm patient admitted for hyperkalemia with acute renal failure and hypotension cause not clear likely from dehydration but patient's cortisol level came at around 2.5.  Also has elevated blood sugar.  W  Review of Systems: As per HPI, rest all negative.   Past Medical History:  Diagnosis Date   Anemia    Arthritis    Back pain    CAD (coronary artery disease)    a. s/p DES to LAD in 05/2016   Cervical radiculopathy      Chronic diastolic CHF (congestive heart failure) (HCC)    Chronic pain    Depression    DVT (deep venous thrombosis) (HCC)    Hematemesis    Hepatic steatosis    Hyperlipidemia    Hypertension    IBS (irritable bowel syndrome)    Morbid obesity (HCC)    OSA (obstructive sleep apnea)    Pancreatitis    PE (pulmonary thromboembolism) ([place])    Peripheral neuropathy    PUD (peptic ulcer disease)    Renal disorder    Stroke [facility]    a. ?details unclear - not seen on imaging when he was admitted in 05/2017 for TIA symptoms which were felt due to cervical radiculopathy.   Thoracic aortic ectasia (HCC)    a. 4.3cm ectatic ascending thoracic aorta by CT 06/2017.    Type 2 diabetes mellitus ([place])     Past Surgical History:  Procedure Laterality Date   CARDIAC CATHETERIZATION N/A 05/31/2016   Procedure: Left Heart Cath and Coronary Angiography;  Surgeon: Peter M Martinique, MD;  Location: [place] [facility];  Service: Cardiovascular;  Laterality: N/A;   CARDIAC CATHETERIZATION N/A 05/31/2016   Procedure: Intravascular Pressure Wire/FFR Study;  Surgeon: Peter M Martinique, MD;  Location: [place] [facility];  Service: Cardiovascular;  Laterality: N/A;   CARDIAC CATHETERIZATION N/A 05/31/2016   Procedure: Coronary Stent Intervention;  Surgeon: Peter M Martinique, MD;  Location: [place] [facility];  Service: Cardiovascular;  Laterality: N/A;   LEFT HEART CATH AND CORONARY ANGIOGRAPHY N/A 12/08/2017   Procedure: LEFT HEART CATH AND CORONARY ANGIOGRAPHY;  Surgeon: Leonie Man, MD;  Location: [place] [facility];  Service: Cardiovascular;  Laterality: N/A;   LEFT HEART CATHETERIZATION WITH CORONARY ANGIOGRAM N/A 02/03/2014   Procedure: LEFT HEART CATHETERIZATION WITH CORONARY ANGIOGRAM;  Surgeon: Pixie Casino, MD;  Location: [facility] C[facility] LAB;  Service: Cardiovascular;  Laterality: N/A;   left leg stent        reports that he has never smoked. He has never used smokeless tobacco. He  reports that he does not drink alcohol or use drugs.  Allergies  Allergen Reactions   Coconut Flavor [Flavoring Agent] Hives   Coconut Oil Hives   Ibuprofen Other (See Comments)    Made gastric ulcers worse   Aleve [Naproxen] Other (See Comments)    DUE TO KIDNEYS   Nsaids Other (See Comments)    Stomach ulcers     Family History  Problem Relation Age of Onset   Cancer Father    Hypertension Mother    Diabetes Mother    Breast cancer Mother    Hypertension Brother    Diabetes Brother    Hypertension Sister    Diabetes Sister     Prior to Admission medications   Medication Sig Start Date End Date Taking? Authorizing Provider  Accu-Chek FastClix Lancets MISC CHECK BLOOD GLUCOSE 2 TIMES/DAY. DX E11.65 03/20/19  Yes [provider]  Albuterol Sulfate (PROAIR RESPICLICK) 123XX123 (90 Base) MCG/ACT AEPB Inhale 2 puffs into the lungs every 6 (six) hours as needed. Patient taking differently: Inhale 2 puffs into the lungs every 6 (six) hours as needed (for breathing).  11/06/18  Yes Robyn Haber, MD  aspirin 81 MG chewable tablet Chew 1 tablet (81 mg total) by mouth daily. 06/01/16  Yes Elwin Mocha, MD  atorvastatin (LIPITOR) 80 MG tablet Take 1 tablet (80 mg total) by mouth daily. 01/26/17  Yes Strader, Fransisco Hertz, PA-C  carvedilol (COREG) 25 MG tablet Take 1 tablet (25 mg total) by mouth 2 (two) times daily with a meal. 01/26/17  Yes Strader, [place] M, PA-C  diclofenac (FLECTOR) 1.3 % PTCH Place 1 patch onto the skin daily as needed (for pain).  12/31/18  Yes [provider]  diclofenac sodium (VOLTAREN) 1 % GEL Apply 1 application topically 4 (four) times daily. Patient taking differently: Apply 1 application topically 4 (four) times daily as needed (for pain).  04/05/18  Yes Melynda Ripple, MD  DULoxetine (CYMBALTA) 60 MG capsule TAKE 1 CAPSULE BY MOUTH EVERY DAY Patient taking differently: Take 60 mg by mouth daily.  12/24/18  Yes Marcial Pacas, MD    furosemide (LASIX) 40 MG tablet Take 40 mg by mouth 3 (three) times daily.   Yes [provider]  glipiZIDE (GLUCOTROL) 10 MG tablet Take 10 mg by mouth 2 (two) times daily. 05/30/17  Yes [provider]  Insulin Detemir (LEVEMIR FLEXTOUCH) 100 UNIT/ML Pen Inject 60 Units into the skin daily. Patient taking differently: Inject 60 Units into the skin at bedtime.  05/06/19  Yes Black, Lezlie Octave, NP  Insulin Pen Needle 31G X 5 MM MISC Use 1 needle daily to inject insulin as prescribed 06/18/17  Yes Barton Dubois, MD  isosorbide mononitrate (IMDUR) 60 MG 24 hr tablet Take 1 tablet (60 mg total) by mouth daily. 02/06/19  Yes Skeet Latch, MD  lisinopril (PRINIVIL,ZESTRIL)  10 MG tablet Take 1 tablet (10 mg total) by mouth daily. 12/10/17  Yes Velvet Bathe, MD  metFORMIN (GLUCOPHAGE-XR) 500 MG 24 hr tablet Take 2 tablets (1,000 mg total) by mouth 2 (two) times daily. 07/02/17  Yes Barton Dubois, MD  nitroGLYCERIN (NITROSTAT) 0.4 MG SL tablet Place 1 tablet (0.4 mg total) under the tongue every 5 (five) minutes as needed for chest pain. 02/06/19  Yes Skeet Latch, MD  NOVOLOG FLEXPEN 100 UNIT/ML FlexPen Inject 16-22 Units into the skin 3 (three) times daily with meals. Per sliding scale 05/13/19  Yes [provider]  oxyCODONE (ROXICODONE) 15 MG immediate release tablet Take 15 mg 3 (three) times daily as needed by mouth for pain.  08/28/17  Yes [provider]  potassium chloride SA (K-DUR,KLOR-CON) 20 MEQ tablet Take 20-40 mEq by mouth See admin instructions. Take 2 tablets at lunch then take 1 tablet at dinner and at bedtime   Yes [provider]  pregabalin (LYRICA) 300 MG capsule Take 300 mg by mouth 2 (two) times daily. 05/18/19  Yes [provider]  sertraline (ZOLOFT) 50 MG tablet Take 50 mg by mouth daily.   Yes [provider]  spironolactone (ALDACTONE) 25 MG tablet Take 25 mg by mouth daily.  06/13/19  Yes [provider]   tiZANidine (ZANAFLEX) 2 MG tablet Take 2 mg by mouth 2 (two) times daily as needed for pain.   Yes [provider]  traZODone (DESYREL) 50 MG tablet Take 1 tablet (50 mg total) by mouth at bedtime. 12/15/16  Yes Florinda Marker, MD    Physical Exam: Constitutional: Moderately built and nourished. Vitals:   07/31/19 2200 07/31/19 2218 07/31/19 2230 07/31/19 2300  BP: 106/60 105/67 118/66 (!) 128/92  Pulse: 86 92 85 85  Resp: (!) 22 17 (!) 23 19  Temp:      TempSrc:      SpO2: 96% 95% 97% 97%   Eyes: Anicteric no pallor. ENMT: No discharge from the ears eyes nose or mouth. Neck: No mass or.  No neck rigidity. Respiratory: No rhonchi or crepitations. Cardiovascular: S1-S2 heard. Abdomen: Soft nontender bowel sounds present. Musculoskeletal: No edema. Skin: No rash. Neurologic: Alert awake oriented to time place and person.  Moves all extremities. Psychiatric: Appears normal per normal affect.   Labs on Admission: I have personally reviewed following labs and imaging studies  CBC: Recent Labs  Lab 07/31/19 1608 07/31/19 1738 07/31/19 1744  WBC 10.8*  --  8.6  NEUTROABS  --   --  7.4  HGB 11.9* 12.2* 13.7  HCT 36.5* 36.0* 41.8  MCV 90.6  --  90.9  PLT 203  --  123XX123   Basic Metabolic Panel: Recent Labs  Lab 07/31/19 1608 07/31/19 1738  NA 129* 130*  K 6.5* 6.6*  CL 97* 100  CO2 18*  --   GLUCOSE 394* 392*  BUN 39* 38*  CREATININE 1.75* 1.60*  CALCIUM 9.5  --    GFR: CrCl cannot be calculated (Unknown ideal weight.). Liver Function Tests: Recent Labs  Lab 07/31/19 1744  AST 22  ALT 23  ALKPHOS 61  BILITOT 0.6  PROT 6.8  ALBUMIN 3.4*   Recent Labs  Lab 07/31/19 1744  LIPASE 29   No results for input(s): AMMONIA in the last 168 hours. Coagulation Profile: No results for input(s): INR, PROTIME in the last 168 hours. Cardiac Enzymes: No results for input(s): CKTOTAL, CKMB, CKMBINDEX, TROPONINI in the last 168 hours. BNP (  last 3 results) No  results for input(s): PROBNP in the last 8760 hours. HbA1C: No results for input(s): HGBA1C in the last 72 hours. CBG: Recent Labs  Lab 07/31/19 1619 07/31/19 1949 08/01/19 0015  GLUCAP 357* 340* 455*   Lipid Profile: No results for input(s): CHOL, HDL, LDLCALC, TRIG, CHOLHDL, LDLDIRECT in the last 72 hours. Thyroid Function Tests: No results for input(s): TSH, T4TOTAL, FREET4, T3FREE, THYROIDAB in the last 72 hours. Anemia Panel: No results for input(s): VITAMINB12, FOLATE, FERRITIN, TIBC, IRON, RETICCTPCT in the last 72 hours. Urine analysis:    Component Value Date/Time   COLORURINE YELLOW 07/31/2019 2035   APPEARANCEUR CLEAR 07/31/2019 2035   LABSPEC 1.017 07/31/2019 2035   PHURINE 5.0 07/31/2019 2035   GLUCOSEU >=500 (A) 07/31/2019 2035   HGBUR NEGATIVE 07/31/2019 2035   BILIRUBINUR NEGATIVE 07/31/2019 2035   KETONESUR NEGATIVE 07/31/2019 2035   PROTEINUR NEGATIVE 07/31/2019 2035   UROBILINOGEN 0.2 05/27/2014 2312   NITRITE NEGATIVE 07/31/2019 2035   LEUKOCYTESUR NEGATIVE 07/31/2019 2035   Sepsis Labs: @LABRCNTIP (procalcitonin:4,lacticidven:4) ) Recent Results (from the past 240 hour(s))  Novel Coronavirus, NAA ([facility])     Status: None   Collection Time: 07/29/19 12:40 PM   Specimen: Nasopharyngeal(NP) swabs in vial transport medium   NASOPHARYNGE  TESTING  Result Value Ref Range Status   SARS-CoV-2, NAA Not Detected Not Detected Final    Comment: This nucleic acid amplification test was developed and its performance characteristics determined by [employer]. Nucleic acid amplification tests include PCR and TMA. This test has not been FDA cleared or approved. This test has been authorized by FDA under an Emergency Use Authorization (EUA). This test is only authorized for the duration of time the declaration that circumstances exist justifying the authorization of the emergency use of in vitro diagnostic tests for detection of SARS-CoV-2 virus and/or  diagnosis of COVID-19 infection under section 564(b)(1) of the Act, 21 U.S.C. [id number]) (1), unless the authorization is terminated or revoked sooner. When diagnostic testing is negative, the possibility of a false negative result should be considered in the context of a patient's recent exposures and the presence of clinical signs and symptoms consistent with COVID-19. An individual without symptoms of COVID-19 and who is not shedding SARS-CoV-2 virus would  expect to have a negative (not detected) result in this assay.   SARS Coronavirus 2 [facility] order, Performed in [facility] hospital lab) Nasopharyngeal Nasopharyngeal Swab     Status: None   Collection Time: 07/31/19  6:25 PM   Specimen: Nasopharyngeal Swab  Result Value Ref Range Status   SARS Coronavirus 2 NEGATIVE NEGATIVE Final    Comment: (NOTE) If result is NEGATIVE SARS-CoV-2 target nucleic acids are NOT DETECTED. The SARS-CoV-2 RNA is generally detectable in upper and lower  respiratory specimens during the acute phase of infection. The lowest  concentration of SARS-CoV-2 viral copies this assay can detect is 250  copies / mL. A negative result does not preclude SARS-CoV-2 infection  and should not be used as the sole basis for treatment or other  patient management decisions.  A negative result may occur with  improper specimen collection / handling, submission of specimen other  than nasopharyngeal swab, presence of viral mutation(s) within the  areas targeted by this assay, and inadequate number of viral copies  (<250 copies / mL). A negative result must be combined with clinical  observations, patient history, and epidemiological information. If result is POSITIVE SARS-CoV-2 target nucleic acids are DETECTED. The SARS-CoV-2 RNA  is generally detectable in upper and lower  respiratory specimens dur ing the acute phase of infection.  Positive  results are indicative of active infection with SARS-CoV-2.  Clinical   correlation with patient history and other diagnostic information is  necessary to determine patient infection status.  Positive results do  not rule out bacterial infection or co-infection with other viruses. If result is PRESUMPTIVE POSTIVE SARS-CoV-2 nucleic acids MAY BE PRESENT.   A presumptive positive result was obtained on the submitted specimen  and confirmed on repeat testing.  While 2019 novel coronavirus  (SARS-CoV-2) nucleic acids may be present in the submitted sample  additional confirmatory testing may be necessary for epidemiological  and / or clinical management purposes  to differentiate between  SARS-CoV-2 and other Sarbecovirus currently known to infect humans.  If clinically indicated additional testing with an alternate test  methodology [phone]) is advised. The SARS-CoV-2 RNA is generally  detectable in upper and lower respiratory sp ecimens during the acute  phase of infection. The expected result is Negative. Fact Sheet for Patients:  StrictlyIdeas.no Fact Sheet for Healthcare Providers: BankingDealers.co.za This test is not yet approved or cleared by the [place] FDA and has been authorized for detection and/or diagnosis of SARS-CoV-2 by FDA under an Emergency Use Authorization (EUA).  This EUA will remain in effect (meaning this test can be used) for the duration of the COVID-19 declaration under Section 564(b)(1) of the Act, 21 U.S.C. section 360bbb-3(b)(1), unless the authorization is terminated or revoked sooner. Performed at [facility], [place] [address] [place], [place] [address]      Radiological Exams on Admission: Ct Angio Chest Pe W And/or Wo Contrast  Result Date: 07/31/2019 CLINICAL DATA:  Concern for pulmonary embolism.  Weakness. EXAM: CT ANGIOGRAPHY CHEST CT ABDOMEN AND PELVIS WITH CONTRAST TECHNIQUE: Multidetector CT imaging of the chest was performed using the standard protocol  during bolus administration of intravenous contrast. Multiplanar CT image reconstructions and MIPs were obtained to evaluate the vascular anatomy. Multidetector CT imaging of the abdomen and pelvis was performed using the standard protocol during bolus administration of intravenous contrast. CONTRAST:  160mL OMNIPAQUE IOHEXOL 350 MG/ML SOLN COMPARISON:  Chest CT 07/03/2018 FINDINGS: CTA CHEST FINDINGS Cardiovascular: No filling defects within the pulmonary arteries to localize acute pulmonary embolism. There is image degradation in the lower lobe pulmonary arteries secondary to patient body habitus with mottled streak artifact. No RIGHT ventricular strain. Mediastinum/Nodes: No axillary supraclavicular adenopathy. No mediastinal hilar adenopathy. No pericardial effusion. Esophagus normal. Lungs/Pleura: No pulmonary infarction. No airspace disease. No atelectasis. No effusion. Is Musculoskeletal: No rib fracture. Review of the MIP images confirms the above findings. CT ABDOMEN and PELVIS FINDINGS Hepatobiliary: Peripheral regions of hypo attenuation/hypoperfusion in the LEFT and RIGHT hepatic lobes. These ill-defined regions are seen on axial image 20/6 for example. No enhancing lesion. No biliary duct dilatation. Gallbladder normal. Pancreas: Normal pancreas Spleen: Low-density lesion in the spleen measures 3.6 cm. This is unchanged from CT 2018. Adrenals/Urinary Tract: Adrenal glands normal. Kidneys enhance uniformly ureters and bladder normal. Stomach/Bowel: Stomach, small bowel appendix and cecum normal. The colon and rectum sigmoid colon normal. Vascular/Lymphatic: Abdominal aorta is normal caliber. There is no retroperitoneal or periportal lymphadenopathy. No pelvic lymphadenopathy. Reproductive: Prostate normal. Other: No free fluid. Musculoskeletal: No aggressive osseous lesion. Review of the MIP images confirms the above findings. IMPRESSION: Chest Impression: 1. No evidence acute pulmonary embolism. 2. No  acute pulmonary parenchymal findings. Abdomen / Pelvis Impression: 1. Hypoperfusion  in the periphery of the LEFT and RIGHT hepatic lobe is nonspecific finding and may represent a benign vascular phenomena. Consider serologic correlation for hepatitis. Abdominal/hepatic MRI could potentially help characterize hepatic abnormality 2. Hypoperfusion in the spleen unchanged from 2018 likely represents a hemangioma or cyst . Electronically Signed   By: Suzy Bouchard M.D.   On: 07/31/2019 19:04   Ct Abdomen Pelvis W Contrast  Result Date: 07/31/2019 CLINICAL DATA:  Concern for pulmonary embolism.  Weakness. EXAM: CT ANGIOGRAPHY CHEST CT ABDOMEN AND PELVIS WITH CONTRAST TECHNIQUE: Multidetector CT imaging of the chest was performed using the standard protocol during bolus administration of intravenous contrast. Multiplanar CT image reconstructions and MIPs were obtained to evaluate the vascular anatomy. Multidetector CT imaging of the abdomen and pelvis was performed using the standard protocol during bolus administration of intravenous contrast. CONTRAST:  153mL OMNIPAQUE IOHEXOL 350 MG/ML SOLN COMPARISON:  Chest CT 07/03/2018 FINDINGS: CTA CHEST FINDINGS Cardiovascular: No filling defects within the pulmonary arteries to localize acute pulmonary embolism. There is image degradation in the lower lobe pulmonary arteries secondary to patient body habitus with mottled streak artifact. No RIGHT ventricular strain. Mediastinum/Nodes: No axillary supraclavicular adenopathy. No mediastinal hilar adenopathy. No pericardial effusion. Esophagus normal. Lungs/Pleura: No pulmonary infarction. No airspace disease. No atelectasis. No effusion. Is Musculoskeletal: No rib fracture. Review of the MIP images confirms the above findings. CT ABDOMEN and PELVIS FINDINGS Hepatobiliary: Peripheral regions of hypo attenuation/hypoperfusion in the LEFT and RIGHT hepatic lobes. These ill-defined regions are seen on axial image 20/6 for  example. No enhancing lesion. No biliary duct dilatation. Gallbladder normal. Pancreas: Normal pancreas Spleen: Low-density lesion in the spleen measures 3.6 cm. This is unchanged from CT 2018. Adrenals/Urinary Tract: Adrenal glands normal. Kidneys enhance uniformly ureters and bladder normal. Stomach/Bowel: Stomach, small bowel appendix and cecum normal. The colon and rectum sigmoid colon normal. Vascular/Lymphatic: Abdominal aorta is normal caliber. There is no retroperitoneal or periportal lymphadenopathy. No pelvic lymphadenopathy. Reproductive: Prostate normal. Other: No free fluid. Musculoskeletal: No aggressive osseous lesion. Review of the MIP images confirms the above findings. IMPRESSION: Chest Impression: 1. No evidence acute pulmonary embolism. 2. No acute pulmonary parenchymal findings. Abdomen / Pelvis Impression: 1. Hypoperfusion in the periphery of the LEFT and RIGHT hepatic lobe is nonspecific finding and may represent a benign vascular phenomena. Consider serologic correlation for hepatitis. Abdominal/hepatic MRI could potentially help characterize hepatic abnormality 2. Hypoperfusion in the spleen unchanged from 2018 likely represents a hemangioma or cyst . Electronically Signed   By: Suzy Bouchard M.D.   On: 07/31/2019 19:04   Dg Chest Port 1 View  Result Date: 07/31/2019 CLINICAL DATA:  Weakness, sleepiness EXAM: PORTABLE CHEST 1 VIEW COMPARISON:  06/05/2019 FINDINGS: The heart size and mediastinal contours are within normal limits. Both lungs are clear. The visualized skeletal structures are unremarkable. IMPRESSION: No active disease. Electronically Signed   By: Kathreen Devoid   On: 07/31/2019 17:07    EKG: Independently reviewed.  Normal sinus rhythm with nonspecific T changes.  Assessment/Plan Principal Problem:   Hypotension Active Problems:   CAD (coronary artery disease)   ARF (acute renal failure) (HCC)   Hyperkalemia   Uncontrolled type 2 diabetes mellitus with  hyperglycemia ([place])    1. Hypotension cause not clear but likely from dehydration from patient not eating recently in addition to taking more antihypertensives and also was recently started on spironolactone.  For now we will continue with hydration hold all antihypertensives and diuretics.  Closely monitor in stepdown.  Patient does not look septic.  Patient's cortisol level is low and discussed with on-call nephrologist who at this time advised to not give steroids and to address acute issues.  Recheck cortisol level. 2. Hyperkalemia with acute renal failure in the context of using spironolactone and ACE inhibitor.  Will discontinue both these medications and patient received Lokelma IV insulin and calcium gluconate.  Will repeat metabolic panel in couple of hours. 3. Diabetes mellitus type 2 uncontrolled with possibility of developing DKA -patient is on Levemir 60 units which has been dosed.  Will repeat metabolic panel and if there is any elevated anion gap will start IV insulin therapy. 4. Hypertension presently hypotensive holding oral antihypertensives. 5. History of CAD status post ending did complain of some chest pressure.  We will cycle cardiac markers for follow-up cardiac markers were negative.  On antiplatelet agents and statins.  Beta-blockers on hold due to low normal blood pressure. 6. History of diastolic CHF presently appears dehydrated and hypotensive holding diuretics.  Getting fluids.  Addendum -repeat metabolic panel showed potassium of 7 bicarb 16 anion gap 16 blood glucose 510.  Given the DKA-like picture I started patient on IV insulin infusion.  Also gave calcium gluconate.  Repeat EKG was ordered.  I also discussed with on-call nephrologist Dr. Royce Macadamia about the persistent hyperkalemia.  At this time Dr. Royce Macadamia advised that since patient is likely having possible DKA IV insulin would likely bring down the potassium.  I also discussed about the low cortisol levels and given the  acute stressful situation Dr. Royce Macadamia advised to treat the DKA and cortisol level can be rechecked again after the acute status.  Not give any steroids for now.   DVT prophylaxis: Lovenox. Code Status: Full code. Family Communication: Discussed with patient. Disposition Plan: Home. Consults called: Discussed with nephrologist. Admission status: Inpatient.   Rise Patience MD [facility] Pager [phone].  If 7PM-7AM, please contact night-coverage [email or web address] Password [facility]  08/01/2019, 12:37 AM

## 2019-08-01 NOTE — ED Notes (Signed)
Breakfast Tray Ordered. 

## 2019-08-01 NOTE — Progress Notes (Signed)
Per gluclose stabilizer protocol drip resumed at 1730 for cbg 152 at 0.9 units per/hr.

## 2019-08-01 NOTE — ED Notes (Signed)
TELE 

## 2019-08-01 NOTE — ED Notes (Signed)
Spoke to Dr. Eliseo Squires. Clarified that it is okay for patient to eat while on insulin gtt.

## 2019-08-01 NOTE — ED Notes (Signed)
ED TO INPATIENT HANDOFF REPORT  ED Nurse Name and Phone #:  Madlynn Lundeen [phone]  S Name/Age/Gender [name] 58 y.o. male Room/Bed: 041C/041C  Code Status   Code Status: Full Code  Home/SNF/Other Home Patient oriented to: self, place, time and situation Is this baseline? Yes   Triage Complete: Triage complete  Chief Complaint Hypotensive  Triage Note Patient arrived by [facility] for hypotension. Patient complains of weakness and sleepiness. Patient alert but pale. Denies any discomfort.   Allergies Allergies  Allergen Reactions  . Coconut Flavor [Flavoring Agent] Hives  . Coconut Oil Hives  . Ibuprofen Other (See Comments)    Made gastric ulcers worse  . Aleve [Naproxen] Other (See Comments)    DUE TO KIDNEYS  . Nsaids Other (See Comments)    Stomach ulcers     Level of Care/Admitting Diagnosis ED Disposition    ED Disposition Condition Comment   Admit  Hospital Area: [place] [[id number]]  Level of Care: Progressive [102]  Covid Evaluation: Asymptomatic Screening Protocol (No Symptoms)  Diagnosis: Hyperkalemia [id number]  Admitting Physician: Rise Patience [phone]  Attending Physician: Rise Patience [phone]  Estimated length of stay: past midnight tomorrow  Certification:: I certify this patient will need inpatient services for at least 2 midnights  PT Class (Do Not Modify): Inpatient [101]  PT Acc Code (Do Not Modify): Private [1]       B Medical/Surgery History Past Medical History:  Diagnosis Date  . Anemia   . Arthritis   . Back pain   . CAD (coronary artery disease)    a. s/p DES to LAD in 05/2016  . Cervical radiculopathy   . Chronic diastolic CHF (congestive heart failure) ([place])   . Chronic pain   . Depression   . DVT (deep venous thrombosis) ([place])   . Hematemesis   . Hepatic steatosis   . Hyperlipidemia   . Hypertension   . IBS (irritable bowel syndrome)   . Morbid obesity ([place])   . OSA (obstructive sleep  apnea)   . Pancreatitis   . PE (pulmonary thromboembolism) ([place])   . Peripheral neuropathy   . PUD (peptic ulcer disease)   . Renal disorder   . Stroke [facility])    a. ?details unclear - not seen on imaging when he was admitted in 05/2017 for TIA symptoms which were felt due to cervical radiculopathy.  . Thoracic aortic ectasia ([facility])    a. 4.3cm ectatic ascending thoracic aorta by CT 06/2017.   . Type 2 diabetes mellitus ([place])    Past Surgical History:  Procedure Laterality Date  . CARDIAC CATHETERIZATION N/A 05/31/2016   Procedure: Left Heart Cath and Coronary Angiography;  Surgeon: Peter M Martinique, MD;  Location: [place] CV LAB;  Service: Cardiovascular;  Laterality: N/A;  . CARDIAC CATHETERIZATION N/A 05/31/2016   Procedure: Intravascular Pressure Wire/FFR Study;  Surgeon: Peter M Martinique, MD;  Location: [place] CV [facility];  Service: Cardiovascular;  Laterality: N/A;  . CARDIAC CATHETERIZATION N/A 05/31/2016   Procedure: Coronary Stent Intervention;  Surgeon: Peter M Martinique, MD;  Location: [place] [facility];  Service: Cardiovascular;  Laterality: N/A;  . LEFT HEART CATH AND CORONARY ANGIOGRAPHY N/A 12/08/2017   Procedure: LEFT HEART CATH AND CORONARY ANGIOGRAPHY;  Surgeon: Leonie Man, MD;  Location: [place] [facility];  Service: Cardiovascular;  Laterality: N/A;  . LEFT HEART CATHETERIZATION WITH CORONARY ANGIOGRAM N/A 02/03/2014   Procedure: LEFT HEART CATHETERIZATION WITH CORONARY ANGIOGRAM;  Surgeon: [name]  C. Hilty, MD;  Location: [place] CATH LAB;  Service: Cardiovascular;  Laterality: N/A;  . left leg stent        A IV Location/Drains/Wounds Patient Lines/Drains/Airways Status   Active Line/Drains/Airways    Name:   Placement date:   Placement time:   Site:   Days:   Peripheral IV 07/31/19 Left;Upper Forearm   07/31/19    1636    Forearm   1          Intake/Output Last 24 hours  Intake/Output Summary (Last 24 hours) at 08/01/2019 1557 Last data filed at 08/01/2019 0400 Gross  per 24 hour  Intake -  Output 750 ml  Net -750 ml    Labs/Imaging Results for orders placed or performed during the hospital encounter of 07/31/19 (from the past 48 hour(s))  Basic metabolic panel     Status: Abnormal   Collection Time: 07/31/19  4:08 PM  Result Value Ref Range   Sodium 129 (L) 135 - 145 mmol/L   Potassium 6.5 (HH) 3.5 - 5.1 mmol/L    Comment: NO VISIBLE HEMOLYSIS CRITICAL RESULT CALLED TO, READ BACK BY AND VERIFIED WITH:  Johny Blamer 1657 07/31/2019 WBOND    Chloride 97 (L) 98 - 111 mmol/L   CO2 18 (L) 22 - 32 mmol/L   Glucose, Bld 394 (H) 70 - 99 mg/dL   BUN 39 (H) 6 - 20 mg/dL   Creatinine, Ser 1.75 (H) 0.61 - 1.24 mg/dL   Calcium 9.5 8.9 - 10.3 mg/dL   GFR calc non Af Amer 43 (L) >60 mL/min   GFR calc Af Amer 50 (L) >60 mL/min   Anion gap 14 5 - 15    Comment: Performed at [facility], [address] [place], [place] [address]  CBC     Status: Abnormal   Collection Time: 07/31/19  4:08 PM  Result Value Ref Range   WBC 10.8 (H) 4.0 - 10.5 K/uL   RBC 4.03 (L) 4.22 - 5.81 MIL/uL   Hemoglobin 11.9 (L) 13.0 - 17.0 g/dL   HCT 36.5 (L) 39.0 - 52.0 %   MCV 90.6 80.0 - 100.0 fL   MCH 29.5 26.0 - 34.0 pg   MCHC 32.6 30.0 - 36.0 g/dL   RDW 14.4 11.5 - 15.5 %   Platelets 203 150 - 400 K/uL   nRBC 0.0 0.0 - 0.2 %    Comment: Performed at [facility], [place] [address], [place], [place] [address]  Culture, blood (routine x 2)     Status: None (Preliminary result)   Collection Time: 07/31/19  4:10 PM   Specimen: BLOOD LEFT HAND  Result Value Ref Range   Specimen Description BLOOD LEFT HAND    Special Requests      BOTTLES DRAWN AEROBIC ONLY Blood Culture results may not be optimal due to an inadequate volume of blood received in culture bottles   Culture      NO GROWTH < 24 HOURS Performed at [facility] [address], [place], [place] [address]    Report Status PENDING   CBG monitoring, ED     Status: Abnormal   Collection Time:  07/31/19  4:19 PM  Result Value Ref Range   Glucose-Capillary 357 (H) 70 - 99 mg/dL   Comment 1 Document in Chart   Lactic acid, plasma     Status: Abnormal   Collection Time: 07/31/19  4:33 PM  Result Value Ref Range   Lactic  Acid, Venous 3.4 (HH) 0.5 - 1.9 mmol/L    Comment: CRITICAL RESULT CALLED TO, READ BACK BY AND VERIFIED WITH:  Rudean Curt ,[place] 1824 07/31/2019 WBOND Performed at [facility], [place] [address], [place], [place] [address]   Culture, blood (routine x 2)     Status: None (Preliminary result)   Collection Time: 07/31/19  4:38 PM   Specimen: BLOOD  Result Value Ref Range   Specimen Description BLOOD SITE NOT SPECIFIED    Special Requests      BOTTLES DRAWN AEROBIC AND ANAEROBIC Blood Culture adequate volume   Culture      NO GROWTH < 24 HOURS Performed at [facility], [place] [address], [place], [place] [address]    Report Status PENDING   I-stat chem 8, ED (not at [facility] or [facility])     Status: Abnormal   Collection Time: 07/31/19  5:38 PM  Result Value Ref Range   Sodium 130 (L) 135 - 145 mmol/L   Potassium 6.6 (HH) 3.5 - 5.1 mmol/L   Chloride 100 98 - 111 mmol/L   BUN 38 (H) 6 - 20 mg/dL   Creatinine, Ser 1.60 (H) 0.61 - 1.24 mg/dL   Glucose, Bld 392 (H) 70 - 99 mg/dL   Calcium, Ion 1.27 1.15 - 1.40 mmol/L   TCO2 23 22 - 32 mmol/L   Hemoglobin 12.2 (L) 13.0 - 17.0 g/dL   HCT 36.0 (L) 39.0 - 52.0 %   Comment NOTIFIED PHYSICIAN   Hepatic function panel     Status: Abnormal   Collection Time: 07/31/19  5:44 PM  Result Value Ref Range   Total Protein 6.8 6.5 - 8.1 g/dL   Albumin 3.4 (L) 3.5 - 5.0 g/dL   AST 22 15 - 41 U/L   ALT 23 0 - 44 U/L   Alkaline Phosphatase 61 38 - 126 U/L   Total Bilirubin 0.6 0.3 - 1.2 mg/dL   Bilirubin, Direct <0.1 0.0 - 0.2 mg/dL   Indirect Bilirubin NOT CALCULATED 0.3 - 0.9 mg/dL    Comment: Performed at [facility], [address] [place], [place] [address]  Lipase, blood     Status: None   Collection Time:  07/31/19  5:44 PM  Result Value Ref Range   Lipase 29 11 - 51 U/L    Comment: Performed at [facility] [address] [place], [place] [address]  Differential     Status: None   Collection Time: 07/31/19  5:44 PM  Result Value Ref Range   Neutrophils Relative % 85 %   Neutro Abs 7.4 1.7 - 7.7 K/uL   Lymphocytes Relative 12 %   Lymphs Abs 1.0 0.7 - 4.0 K/uL   Monocytes Relative 2 %   Monocytes Absolute 0.2 0.1 - 1.0 K/uL   Eosinophils Relative 0 %   Eosinophils Absolute 0.0 0.0 - 0.5 K/uL   Basophils Relative 0 %   Basophils Absolute 0.0 0.0 - 0.1 K/uL   Immature Granulocytes 1 %   Abs Immature Granulocytes 0.05 0.00 - 0.07 K/uL    Comment: Performed at [facility] [address] [place],  [address]  Troponin I (High Sensitivity)     Status: None   Collection Time: 07/31/19  5:44 PM  Result Value Ref Range   Troponin I (High Sensitivity) 4 <18 ng/L    Comment: (NOTE) Elevated high sensitivity troponin I (hsTnI) values and significant  changes across serial measurements  may suggest ACS but many other  chronic and acute conditions are known to elevate hsTnI results.  Refer to the Links section for chest pain algorithms and additional  guidance. Performed at [facility], [place] [address] [place], [place] [address]   CBC     Status: None   Collection Time: 07/31/19  5:44 PM  Result Value Ref Range   WBC 8.6 4.0 - 10.5 K/uL   RBC 4.60 4.22 - 5.81 MIL/uL   Hemoglobin 13.7 13.0 - 17.0 g/dL   HCT 41.8 39.0 - 52.0 %   MCV 90.9 80.0 - 100.0 fL   MCH 29.8 26.0 - 34.0 pg   MCHC 32.8 30.0 - 36.0 g/dL   RDW 14.4 11.5 - 15.5 %   Platelets 169 150 - 400 K/uL   nRBC 0.0 0.0 - 0.2 %    Comment: Performed at [facility], [place] [address] [place], [place] [address]  Brain natriuretic peptide     Status: None   Collection Time: 07/31/19  5:46 PM  Result Value Ref Range   B Natriuretic Peptide 13.5 0.0 - 100.0 pg/mL    Comment: Performed at [facility] [address] [place], [place] [address]  SARS Coronavirus 2 [facility] order, Performed in [facility] hospital lab) Nasopharyngeal Nasopharyngeal Swab     Status: None   Collection Time: 07/31/19  6:25 PM   Specimen: Nasopharyngeal Swab  Result Value Ref Range   SARS Coronavirus 2 NEGATIVE NEGATIVE    Comment: (NOTE) If result is NEGATIVE SARS-CoV-2 target nucleic acids are NOT DETECTED. The SARS-CoV-2 RNA is generally detectable in upper and lower  respiratory specimens during the acute phase of infection. The lowest  concentration of SARS-CoV-2 viral copies this assay can detect is 250  copies / mL. A negative result does not preclude SARS-CoV-2 infection  and should not be used as the sole basis for treatment or other  patient management decisions.  A negative result may occur with  improper specimen collection / handling, submission of specimen other  than nasopharyngeal swab, presence of viral mutation(s) within the  areas targeted by this assay, and inadequate number of viral copies  (<250 copies / mL). A negative result must be combined with clinical  observations, patient history, and epidemiological information. If result is POSITIVE SARS-CoV-2 target nucleic acids are DETECTED. The SARS-CoV-2 RNA is generally detectable in upper and lower  respiratory specimens dur ing the acute phase of infection.  Positive  results are indicative of active infection with SARS-CoV-2.  Clinical  correlation with patient history and other diagnostic information is  necessary to determine patient infection status.  Positive results do  not rule out bacterial infection or co-infection with other viruses. If result is PRESUMPTIVE POSTIVE SARS-CoV-2 nucleic acids MAY BE PRESENT.   A presumptive positive result was obtained on the submitted specimen  and confirmed on repeat testing.  While 2019 novel coronavirus  (SARS-CoV-2) nucleic acids may be present in the submitted sample   additional confirmatory testing may be necessary for epidemiological  and / or clinical management purposes  to differentiate between  SARS-CoV-2 and other Sarbecovirus currently known to infect humans.  If clinically indicated additional testing with an alternate test  methodology [phone]) is advised. The SARS-CoV-2 RNA is generally  detectable in upper and lower respiratory sp ecimens during the acute  phase of infection. The expected result is Negative. Fact Sheet for Patients:  StrictlyIdeas.no Fact Sheet for Healthcare  Providers: BankingDealers.co.za This test is not yet approved or cleared by the [place] and has been authorized for detection and/or diagnosis of SARS-CoV-2 by FDA under an Emergency Use Authorization (EUA).  This EUA will remain in effect (meaning this test can be used) for the duration of the COVID-19 declaration under Section 564(b)(1) of the Act, 21 U.S.C. section 360bbb-3(b)(1), unless the authorization is terminated or revoked sooner. Performed at [facility], [place] [address] [place], [place] [address]   CBG monitoring, ED     Status: Abnormal   Collection Time: 07/31/19  7:49 PM  Result Value Ref Range   Glucose-Capillary 340 (H) 70 - 99 mg/dL  Lactic acid, plasma     Status: Abnormal   Collection Time: 07/31/19  8:05 PM  Result Value Ref Range   Lactic Acid, Venous 3.8 (HH) 0.5 - 1.9 mmol/L    Comment: CRITICAL VALUE NOTED.  VALUE IS CONSISTENT WITH PREVIOUSLY REPORTED AND CALLED VALUE. Performed at [facility], [place] [address] [place], [place] [address]   Troponin I (High Sensitivity)     Status: None   Collection Time: 07/31/19  8:06 PM  Result Value Ref Range   Troponin I (High Sensitivity) 5 <18 ng/L    Comment: (NOTE) Elevated high sensitivity troponin I (hsTnI) values and significant  changes across serial measurements may suggest ACS but many other  chronic and acute  conditions are known to elevate hsTnI results.  Refer to the "Links" section for chest pain algorithms and additional  guidance. Performed at [facility], [place] [address], [place], [place] [address]   Cortisol     Status: None   Collection Time: 07/31/19  8:19 PM  Result Value Ref Range   Cortisol, Plasma 2.5 ug/dL    Comment: (NOTE) AM    6.7 - 22.6 ug/dL PM   <10.0       ug/dL Performed at [facility] [address] [place], [place] [address]   Urinalysis, Routine w reflex microscopic     Status: Abnormal   Collection Time: 07/31/19  8:35 PM  Result Value Ref Range   Color, Urine YELLOW YELLOW   APPearance CLEAR CLEAR   Specific Gravity, Urine 1.017 1.005 - 1.030   pH 5.0 5.0 - 8.0   Glucose, UA >=500 (A) NEGATIVE mg/dL   Hgb urine dipstick NEGATIVE NEGATIVE   Bilirubin Urine NEGATIVE NEGATIVE   Ketones, ur NEGATIVE NEGATIVE mg/dL   Protein, ur NEGATIVE NEGATIVE mg/dL   Nitrite NEGATIVE NEGATIVE   Leukocytes,Ua NEGATIVE NEGATIVE   WBC, UA 0-5 0 - 5 WBC/hpf   Bacteria, UA NONE SEEN NONE SEEN   Mucus PRESENT     Comment: Performed at [facility] [address], [place], [place] [address]  CBG monitoring, ED     Status: Abnormal   Collection Time: 08/01/19 12:15 AM  Result Value Ref Range   Glucose-Capillary 455 (H) 70 - 99 mg/dL  Basic metabolic panel     Status: Abnormal   Collection Time: 08/01/19 12:37 AM  Result Value Ref Range   Sodium 130 (L) 135 - 145 mmol/L   Potassium 7.0 (HH) 3.5 - 5.1 mmol/L    Comment: NO VISIBLE HEMOLYSIS CRITICAL RESULT CALLED TO, READ BACK BY AND VERIFIED WITH: FLORES M,RN 08/01/19 0145 WAYK    Chloride 98 98 - 111 mmol/L   CO2 16 (L) 22 - 32 mmol/L   Glucose, Bld 510 (HH) 70 -  99 mg/dL    Comment: CRITICAL RESULT CALLED TO, READ BACK BY AND VERIFIED WITH: FLORES M,RN 08/01/19 0145 WAYK    BUN 33 (H) 6 - 20 mg/dL   Creatinine, Ser 1.56 (H) 0.61 - 1.24 mg/dL   Calcium 9.1 8.9 - 10.3 mg/dL   GFR calc non Af  Amer 49 (L) >60 mL/min   GFR calc Af Amer 57 (L) >60 mL/min   Anion gap 16 (H) 5 - 15    Comment: Performed at [facility] [address] [place], [place] [address]  CBG monitoring, ED     Status: Abnormal   Collection Time: 08/01/19  2:28 AM  Result Value Ref Range   Glucose-Capillary 410 (H) 70 - 99 mg/dL  CBG monitoring, ED     Status: Abnormal   Collection Time: 08/01/19  3:56 AM  Result Value Ref Range   Glucose-Capillary 356 (H) 70 - 99 mg/dL  CBG monitoring, ED     Status: Abnormal   Collection Time: 08/01/19  4:54 AM  Result Value Ref Range   Glucose-Capillary 346 (H) 70 - 99 mg/dL  Basic metabolic panel     Status: Abnormal   Collection Time: 08/01/19  4:56 AM  Result Value Ref Range   Sodium 132 (L) 135 - 145 mmol/L   Potassium 6.5 (HH) 3.5 - 5.1 mmol/L    Comment: NO VISIBLE HEMOLYSIS CRITICAL RESULT CALLED TO, READ BACK BY AND VERIFIED WITH: LEBRON Y,RN 08/01/19 0539 WAYK    Chloride 101 98 - 111 mmol/L   CO2 19 (L) 22 - 32 mmol/L   Glucose, Bld 370 (H) 70 - 99 mg/dL   BUN 33 (H) 6 - 20 mg/dL   Creatinine, Ser 1.45 (H) 0.61 - 1.24 mg/dL   Calcium 9.5 8.9 - 10.3 mg/dL   GFR calc non Af Amer 54 (L) >60 mL/min   GFR calc Af Amer >60 >60 mL/min   Anion gap 12 5 - 15    Comment: Performed at [facility], [place] [address] [place], [place] [address]  CBC     Status: Abnormal   Collection Time: 08/01/19  4:56 AM  Result Value Ref Range   WBC 14.3 (H) 4.0 - 10.5 K/uL   RBC 4.02 (L) 4.22 - 5.81 MIL/uL   Hemoglobin 11.9 (L) 13.0 - 17.0 g/dL   HCT 36.1 (L) 39.0 - 52.0 %   MCV 89.8 80.0 - 100.0 fL   MCH 29.6 26.0 - 34.0 pg   MCHC 33.0 30.0 - 36.0 g/dL   RDW 14.3 11.5 - 15.5 %   Platelets 225 150 - 400 K/uL   nRBC 0.0 0.0 - 0.2 %    Comment: Performed at [facility], [place] [address] [place], [place] [address]  CBG monitoring, ED     Status: Abnormal   Collection Time: 08/01/19  5:57 AM  Result Value Ref Range   Glucose-Capillary 271 (H) 70 - 99  mg/dL  CBG monitoring, ED     Status: Abnormal   Collection Time: 08/01/19  6:59 AM  Result Value Ref Range   Glucose-Capillary 244 (H) 70 - 99 mg/dL  Cortisol     Status: None   Collection Time: 08/01/19  7:04 AM  Result Value Ref Range   Cortisol, Plasma 0.8 ug/dL    Comment: (NOTE) AM    6.7 - 22.6 ug/dL PM   <10.0       ug/dL Performed at [facility],  [address], [place], [place] [address]   CBG monitoring, ED     Status: Abnormal   Collection Time: 08/01/19  8:05 AM  Result Value Ref Range   Glucose-Capillary 214 (H) 70 - 99 mg/dL   Comment 1 Notify RN    Comment 2 Document in Chart   Basic metabolic panel     Status: Abnormal   Collection Time: 08/01/19  9:00 AM  Result Value Ref Range   Sodium 135 135 - 145 mmol/L   Potassium 5.5 (H) 3.5 - 5.1 mmol/L   Chloride 104 98 - 111 mmol/L   CO2 19 (L) 22 - 32 mmol/L   Glucose, Bld 229 (H) 70 - 99 mg/dL   BUN 32 (H) 6 - 20 mg/dL   Creatinine, Ser 1.32 (H) 0.61 - 1.24 mg/dL   Calcium 9.3 8.9 - 10.3 mg/dL   GFR calc non Af Amer >60 >60 mL/min   GFR calc Af Amer >60 >60 mL/min   Anion gap 12 5 - 15    Comment: Performed at [facility] [address], [place], [place] [address]  CBG monitoring, ED     Status: Abnormal   Collection Time: 08/01/19  9:13 AM  Result Value Ref Range   Glucose-Capillary 206 (H) 70 - 99 mg/dL  Lactic acid, plasma     Status: Abnormal   Collection Time: 08/01/19  9:46 AM  Result Value Ref Range   Lactic Acid, Venous 3.1 (HH) 0.5 - 1.9 mmol/L    Comment: CRITICAL VALUE NOTED.  VALUE IS CONSISTENT WITH PREVIOUSLY REPORTED AND CALLED VALUE. Performed at [facility], [place] [address] [place], [place] [address]   CBG monitoring, ED     Status: Abnormal   Collection Time: 08/01/19 10:44 AM  Result Value Ref Range   Glucose-Capillary 228 (H) 70 - 99 mg/dL   Comment 1 Notify RN    Comment 2 Document in Chart   CBG monitoring, ED     Status: Abnormal   Collection Time: 08/01/19  11:52 AM  Result Value Ref Range   Glucose-Capillary 235 (H) 70 - 99 mg/dL  Basic metabolic panel     Status: Abnormal   Collection Time: 08/01/19  1:15 PM  Result Value Ref Range   Sodium 137 135 - 145 mmol/L   Potassium 5.0 3.5 - 5.1 mmol/L   Chloride 101 98 - 111 mmol/L   CO2 24 22 - 32 mmol/L   Glucose, Bld 165 (H) 70 - 99 mg/dL   BUN 29 (H) 6 - 20 mg/dL   Creatinine, Ser 1.25 (H) 0.61 - 1.24 mg/dL   Calcium 9.8 8.9 - 10.3 mg/dL   GFR calc non Af Amer >60 >60 mL/min   GFR calc Af Amer >60 >60 mL/min   Anion gap 12 5 - 15    Comment: Performed at [facility], [place] [address], [place], [place] [address]  CBG monitoring, ED     Status: Abnormal   Collection Time: 08/01/19  1:17 PM  Result Value Ref Range   Glucose-Capillary 158 (H) 70 - 99 mg/dL  CBG monitoring, ED     Status: Abnormal   Collection Time: 08/01/19  2:26 PM  Result Value Ref Range   Glucose-Capillary 200 (H) 70 - 99 mg/dL   Comment 1 Notify RN    Comment 2 Document in Chart    Ct Angio Chest Pe W And/or Wo Contrast  Result Date: 07/31/2019 CLINICAL DATA:  Concern for pulmonary embolism.  Weakness. EXAM: CT ANGIOGRAPHY CHEST CT ABDOMEN AND PELVIS WITH CONTRAST TECHNIQUE: Multidetector CT imaging of the chest was performed using the standard protocol during bolus administration of intravenous contrast. Multiplanar CT image reconstructions and MIPs were obtained to evaluate the vascular anatomy. Multidetector CT imaging of the abdomen and pelvis was performed using the standard protocol during bolus administration of intravenous contrast. CONTRAST:  181mL OMNIPAQUE IOHEXOL 350 MG/ML SOLN COMPARISON:  Chest CT 07/03/2018 FINDINGS: CTA CHEST FINDINGS Cardiovascular: No filling defects within the pulmonary arteries to localize acute pulmonary embolism. There is image degradation in the lower lobe pulmonary arteries secondary to patient body habitus with mottled streak artifact. No RIGHT ventricular strain.  Mediastinum/Nodes: No axillary supraclavicular adenopathy. No mediastinal hilar adenopathy. No pericardial effusion. Esophagus normal. Lungs/Pleura: No pulmonary infarction. No airspace disease. No atelectasis. No effusion. Is Musculoskeletal: No rib fracture. Review of the MIP images confirms the above findings. CT ABDOMEN and PELVIS FINDINGS Hepatobiliary: Peripheral regions of hypo attenuation/hypoperfusion in the LEFT and RIGHT hepatic lobes. These ill-defined regions are seen on axial image 20/6 for example. No enhancing lesion. No biliary duct dilatation. Gallbladder normal. Pancreas: Normal pancreas Spleen: Low-density lesion in the spleen measures 3.6 cm. This is unchanged from CT 2018. Adrenals/Urinary Tract: Adrenal glands normal. Kidneys enhance uniformly ureters and bladder normal. Stomach/Bowel: Stomach, small bowel appendix and cecum normal. The colon and rectum sigmoid colon normal. Vascular/Lymphatic: Abdominal aorta is normal caliber. There is no retroperitoneal or periportal lymphadenopathy. No pelvic lymphadenopathy. Reproductive: Prostate normal. Other: No free fluid. Musculoskeletal: No aggressive osseous lesion. Review of the MIP images confirms the above findings. IMPRESSION: Chest Impression: 1. No evidence acute pulmonary embolism. 2. No acute pulmonary parenchymal findings. Abdomen / Pelvis Impression: 1. Hypoperfusion in the periphery of the LEFT and RIGHT hepatic lobe is nonspecific finding and may represent a benign vascular phenomena. Consider serologic correlation for hepatitis. Abdominal/hepatic MRI could potentially help characterize hepatic abnormality 2. Hypoperfusion in the spleen unchanged from 2018 likely represents a hemangioma or cyst . Electronically Signed   By: Suzy Bouchard M.D.   On: 07/31/2019 19:04   Ct Abdomen Pelvis W Contrast  Result Date: 07/31/2019 CLINICAL DATA:  Concern for pulmonary embolism.  Weakness. EXAM: CT ANGIOGRAPHY CHEST CT ABDOMEN AND PELVIS  WITH CONTRAST TECHNIQUE: Multidetector CT imaging of the chest was performed using the standard protocol during bolus administration of intravenous contrast. Multiplanar CT image reconstructions and MIPs were obtained to evaluate the vascular anatomy. Multidetector CT imaging of the abdomen and pelvis was performed using the standard protocol during bolus administration of intravenous contrast. CONTRAST:  110mL OMNIPAQUE IOHEXOL 350 MG/ML SOLN COMPARISON:  Chest CT 07/03/2018 FINDINGS: CTA CHEST FINDINGS Cardiovascular: No filling defects within the pulmonary arteries to localize acute pulmonary embolism. There is image degradation in the lower lobe pulmonary arteries secondary to patient body habitus with mottled streak artifact. No RIGHT ventricular strain. Mediastinum/Nodes: No axillary supraclavicular adenopathy. No mediastinal hilar adenopathy. No pericardial effusion. Esophagus normal. Lungs/Pleura: No pulmonary infarction. No airspace disease. No atelectasis. No effusion. Is Musculoskeletal: No rib fracture. Review of the MIP images confirms the above findings. CT ABDOMEN and PELVIS FINDINGS Hepatobiliary: Peripheral regions of hypo attenuation/hypoperfusion in the LEFT and RIGHT hepatic lobes. These ill-defined regions are seen on axial image 20/6 for example. No enhancing lesion. No biliary duct dilatation. Gallbladder normal. Pancreas: Normal pancreas Spleen: Low-density lesion in the spleen measures 3.6 cm. This is unchanged from CT 2018. Adrenals/Urinary Tract: Adrenal glands normal.  Kidneys enhance uniformly ureters and bladder normal. Stomach/Bowel: Stomach, small bowel appendix and cecum normal. The colon and rectum sigmoid colon normal. Vascular/Lymphatic: Abdominal aorta is normal caliber. There is no retroperitoneal or periportal lymphadenopathy. No pelvic lymphadenopathy. Reproductive: Prostate normal. Other: No free fluid. Musculoskeletal: No aggressive osseous lesion. Review of the MIP images  confirms the above findings. IMPRESSION: Chest Impression: 1. No evidence acute pulmonary embolism. 2. No acute pulmonary parenchymal findings. Abdomen / Pelvis Impression: 1. Hypoperfusion in the periphery of the LEFT and RIGHT hepatic lobe is nonspecific finding and may represent a benign vascular phenomena. Consider serologic correlation for hepatitis. Abdominal/hepatic MRI could potentially help characterize hepatic abnormality 2. Hypoperfusion in the spleen unchanged from 2018 likely represents a hemangioma or cyst . Electronically Signed   By: Suzy Bouchard M.D.   On: 07/31/2019 19:04   Dg Chest Port 1 View  Result Date: 07/31/2019 CLINICAL DATA:  Weakness, sleepiness EXAM: PORTABLE CHEST 1 VIEW COMPARISON:  06/05/2019 FINDINGS: The heart size and mediastinal contours are within normal limits. Both lungs are clear. The visualized skeletal structures are unremarkable. IMPRESSION: No active disease. Electronically Signed   By: Kathreen Devoid   On: 07/31/2019 17:07    Pending Labs Unresulted Labs (From admission, onward)    Start     Ordered   08/08/19 0500  Creatinine, serum  (enoxaparin (LOVENOX)    CrCl >/= 30 ml/min)  Weekly,   R    Comments: while on enoxaparin therapy    08/01/19 0036   08/02/19 0500  CBC  Tomorrow morning,   R     08/01/19 1046   08/02/19 [id number]  Basic metabolic panel  Tomorrow morning,   R     08/01/19 1046   08/01/19 [id number]  Basic metabolic panel  Now then every 4 hours,   R (with STAT occurrences)     08/01/19 0642          Vitals/Pain Today's Vitals   08/01/19 1000 08/01/19 1030 08/01/19 1203 08/01/19 1500  BP: 100/69 (!) 102/48 (!) 105/59 (!) 100/56  Pulse:   73 85  Resp:   15 16  Temp:      TempSrc:   Oral Oral  SpO2:   98% 97%  PainSc:        Isolation Precautions No active isolations  Medications Medications  sodium chloride flush (NS) 0.9 % injection 3 mL (3 mLs Intravenous Not Given 07/31/19 1658)  0.9 %  sodium chloride infusion (  Intravenous Hold 08/01/19 1520)  aspirin chewable tablet 81 mg (81 mg Oral Given 08/01/19 0957)  oxyCODONE (Oxy IR/ROXICODONE) immediate release tablet 15 mg (has no administration in time range)  atorvastatin (LIPITOR) tablet 80 mg (80 mg Oral Given 08/01/19 0957)  DULoxetine (CYMBALTA) DR capsule 60 mg (60 mg Oral Given 08/01/19 0957)  sertraline (ZOLOFT) tablet 50 mg (50 mg Oral Given 08/01/19 0957)  traZODone (DESYREL) tablet 50 mg (50 mg Oral Given 08/01/19 0300)  pregabalin (LYRICA) capsule 300 mg (300 mg Oral Given 08/01/19 1525)  tiZANidine (ZANAFLEX) tablet 2 mg (has no administration in time range)  albuterol (PROVENTIL) (2.5 MG/3ML) 0.083% nebulizer solution 2.5 mg (has no administration in time range)  acetaminophen (TYLENOL) tablet 650 mg (has no administration in time range)    Or  acetaminophen (TYLENOL) suppository 650 mg (has no administration in time range)  ondansetron (ZOFRAN) tablet 4 mg (has no administration in time range)    Or  ondansetron (ZOFRAN) injection 4 mg (has no administration  in time range)  enoxaparin (LOVENOX) injection 40 mg (has no administration in time range)  dextrose 5 %-0.45 % sodium chloride infusion ( Intravenous New Bag/Given 08/01/19 1202)  insulin regular bolus via infusion 0-10 Units (0 Units Intravenous Not Given 08/01/19 1528)  insulin regular, human (MYXREDLIN) 100 units/ 100 mL infusion (7.8 Units/hr Intravenous Rate/Dose Change 08/01/19 1328)  dextrose 50 % solution 25 mL (has no administration in time range)  0.9 %  sodium chloride infusion ( Intravenous New Bag/Given 08/01/19 0238)  insulin detemir (LEVEMIR) injection 50 Units (has no administration in time range)  sodium chloride 0.9 % bolus 1,000 mL (0 mLs Intravenous Stopped 07/31/19 2153)  iohexol (OMNIPAQUE) 350 MG/ML injection 100 mL (100 mLs Intravenous Contrast Given 07/31/19 1740)  sodium chloride 0.9 % bolus 1,000 mL (0 mLs Intravenous Stopped 07/31/19 2229)  insulin aspart (novoLOG)  injection 5 Units (5 Units Intravenous Given 07/31/19 1813)  albuterol (VENTOLIN HFA) 108 (90 Base) MCG/ACT inhaler 4 puff (4 puffs Inhalation Given 07/31/19 1814)  sodium zirconium cyclosilicate (LOKELMA) packet 10 g (10 g Oral Given 07/31/19 1814)  calcium gluconate inj 10% (1 g) URGENT USE ONLY! (1 g Intravenous Given 08/01/19 0234)  insulin aspart (novoLOG) injection 10 Units (10 Units Intravenous Given 08/01/19 0231)  sodium zirconium cyclosilicate (LOKELMA) packet 5 g (5 g Oral Given 08/01/19 0630)    Mobility walks Low fall risk   Focused Assessments Cardiac Assessment Handoff:    Lab Results  Component Value Date   CKTOTAL 91 05/05/2019   CKMB 2.9 05/05/2019   TROPONINI <0.03 12/08/2017   Lab Results  Component Value Date   DDIMER 0.39 06/05/2019   Does the Patient currently have chest pain? No     R Recommendations: See Admitting Provider Note  Report given to:   Additional Notes:

## 2019-08-01 NOTE — ED Notes (Signed)
RN paged Dr. Hal Hope to change bed to SDU

## 2019-08-01 NOTE — Progress Notes (Signed)
Patient refused CPAP tonight 

## 2019-08-01 NOTE — Progress Notes (Addendum)
Patient admitted to the hospital after midnight but care began in the ER prior.   1. Hypotension cause not clear but likely from dehydration from patient not eating recently in addition to taking more antihypertensives and also was recently started on spironolactone.  For now we will continue with hydration hold all antihypertensives and diuretics.  Closely monitor in stepdown.  Patient does not look septic.  Patient's cortisol level is low and Dr. Hal Hope discussed with on-call nephrologist who at this time advised to not give steroids and to address acute issues 2. Hyperkalemia with acute renal failure in the context of using spironolactone and ACE inhibitor.  Will discontinue both these medications and patient received Lokelma IV insulin and calcium gluconate.  BMP q 4 hour 3. Diabetes mellitus type 2 uncontrolled with possibility of developing DKA like picture so in ER was started on IV insulin gtt-- BMP improving, continue to monitor.  PLAN TO LEAVE ON GTT OVERNIGHT TO SEE HOW MUCH INSULIN HE IS REQUIRING-- DISCUSSED WITH DM COORDINATOR- plan to start levemir 50 BID tomm AM and then transition off gtt TOMORROW 4. Hypertension: currently hypotensive holding oral antihypertensives. 5. History of CAD status post ending did complain of some chest pressure: cardiac markers were negative.  On antiplatelet agents and statins.  Beta-blockers on hold due to low normal blood pressure. 6. History of diastolic CHF: appears dehydrated and hypotensive holding diuretics.  Getting fluids.  Juan Bear DO

## 2019-08-01 NOTE — Progress Notes (Addendum)
cbg is 230 per stabilizer protocol insuline drip increased to 3.4 units per hour.

## 2019-08-01 NOTE — Progress Notes (Signed)
Insulin drip stopped due to cbg 70,and given IVP dextrose [id number] per policy see MAR. Patient awake alert drinking fluids at bedside asymtomatic of cbg drop. Will continue to observe for changes.

## 2019-08-01 NOTE — ED Notes (Signed)
Date and time results received: 08/01/19  (use smartphrase ".now" to insert current time)  Test:Potassium Critical Value: 6.5

## 2019-08-02 ENCOUNTER — Encounter (HOSPITAL_COMMUNITY): Payer: Self-pay | Admitting: General Practice

## 2019-08-02 LAB — GLUCOSE, CAPILLARY
Glucose-Capillary: 134 mg/dL — ABNORMAL HIGH (ref 70–99)
Glucose-Capillary: 138 mg/dL — ABNORMAL HIGH (ref 70–99)
Glucose-Capillary: 140 mg/dL — ABNORMAL HIGH (ref 70–99)
Glucose-Capillary: 144 mg/dL — ABNORMAL HIGH (ref 70–99)
Glucose-Capillary: 147 mg/dL — ABNORMAL HIGH (ref 70–99)
Glucose-Capillary: 157 mg/dL — ABNORMAL HIGH (ref 70–99)
Glucose-Capillary: 158 mg/dL — ABNORMAL HIGH (ref 70–99)
Glucose-Capillary: 165 mg/dL — ABNORMAL HIGH (ref 70–99)
Glucose-Capillary: 185 mg/dL — ABNORMAL HIGH (ref 70–99)
Glucose-Capillary: 186 mg/dL — ABNORMAL HIGH (ref 70–99)
Glucose-Capillary: 194 mg/dL — ABNORMAL HIGH (ref 70–99)
Glucose-Capillary: 204 mg/dL — ABNORMAL HIGH (ref 70–99)
Glucose-Capillary: 221 mg/dL — ABNORMAL HIGH (ref 70–99)
Glucose-Capillary: 254 mg/dL — ABNORMAL HIGH (ref 70–99)
Glucose-Capillary: 259 mg/dL — ABNORMAL HIGH (ref 70–99)
Glucose-Capillary: 275 mg/dL — ABNORMAL HIGH (ref 70–99)
Glucose-Capillary: 295 mg/dL — ABNORMAL HIGH (ref 70–99)
Glucose-Capillary: 310 mg/dL — ABNORMAL HIGH (ref 70–99)

## 2019-08-02 LAB — BASIC METABOLIC PANEL
Anion gap: 15 (ref 5–15)
Anion gap: 15 (ref 5–15)
Anion gap: 13 (ref 5–15)
Anion gap: 17 — ABNORMAL HIGH (ref 5–15)
BUN: 23 mg/dL — ABNORMAL HIGH (ref 6–20)
BUN: 21 mg/dL — ABNORMAL HIGH (ref 6–20)
BUN: 20 mg/dL (ref 6–20)
BUN: 19 mg/dL (ref 6–20)
CO2: 22 mmol/L (ref 22–32)
CO2: 19 mmol/L — ABNORMAL LOW (ref 22–32)
CO2: 21 mmol/L — ABNORMAL LOW (ref 22–32)
CO2: 15 mmol/L — ABNORMAL LOW (ref 22–32)
Calcium: 9.8 mg/dL (ref 8.9–10.3)
Calcium: 9.8 mg/dL (ref 8.9–10.3)
Calcium: 9.4 mg/dL (ref 8.9–10.3)
Calcium: 9.1 mg/dL (ref 8.9–10.3)
Chloride: 100 mmol/L (ref 98–111)
Chloride: 99 mmol/L (ref 98–111)
Chloride: 99 mmol/L (ref 98–111)
Chloride: 101 mmol/L (ref 98–111)
Creatinine, Ser: 0.99 mg/dL (ref 0.61–1.24)
Creatinine, Ser: 1.08 mg/dL (ref 0.61–1.24)
Creatinine, Ser: 0.96 mg/dL (ref 0.61–1.24)
Creatinine, Ser: 0.94 mg/dL (ref 0.61–1.24)
GFR calc Af Amer: >60 mL/min (ref >60)
GFR calc Af Amer: >60 mL/min (ref >60)
GFR calc Af Amer: >60 mL/min (ref >60)
GFR calc Af Amer: >60 mL/min (ref >60)
GFR calc non Af Amer: >60 mL/min (ref >60)
GFR calc non Af Amer: >60 mL/min (ref >60)
GFR calc non Af Amer: >60 mL/min (ref >60)
GFR calc non Af Amer: >60 mL/min (ref >60)
Glucose, Bld: 151 mg/dL — ABNORMAL HIGH (ref 70–99)
Glucose, Bld: 257 mg/dL — ABNORMAL HIGH (ref 70–99)
Glucose, Bld: 236 mg/dL — ABNORMAL HIGH (ref 70–99)
Glucose, Bld: 324 mg/dL — ABNORMAL HIGH (ref 70–99)
Potassium: 4.4 mmol/L (ref 3.5–5.1)
Potassium: 5.2 mmol/L — ABNORMAL HIGH (ref 3.5–5.1)
Potassium: 5.2 mmol/L — ABNORMAL HIGH (ref 3.5–5.1)
Potassium: 6 mmol/L — ABNORMAL HIGH (ref 3.5–5.1)
Sodium: 137 mmol/L (ref 135–145)
Sodium: 133 mmol/L — ABNORMAL LOW (ref 135–145)
Sodium: 133 mmol/L — ABNORMAL LOW (ref 135–145)
Sodium: 133 mmol/L — ABNORMAL LOW (ref 135–145)

## 2019-08-02 LAB — CBC
HCT: 38.3 % — ABNORMAL LOW (ref 39.0–52.0)
Hemoglobin: 12.3 g/dL — ABNORMAL LOW (ref 13.0–17.0)
MCH: 29.1 pg (ref 26.0–34.0)
MCHC: 32.1 g/dL (ref 30.0–36.0)
MCV: 90.5 fL (ref 80.0–100.0)
Platelets: 223 10*3/uL (ref 150–400)
RBC: 4.23 MIL/uL (ref 4.22–5.81)
RDW: 14.8 % (ref 11.5–15.5)
WBC: 12.2 10*3/uL — ABNORMAL HIGH (ref 4.0–10.5)
nRBC: 0 % (ref 0.0–0.2)

## 2019-08-02 LAB — CORTISOL-AM, BLOOD: Cortisol - AM: 17.2 ug/dL (ref 6.7–22.6)

## 2019-08-02 MED ORDER — INSULIN ASPART 100 UNIT/ML ~~LOC~~ SOLN
10.0000 [IU] | Freq: Three times a day (TID) | SUBCUTANEOUS | Status: DC
  Administered 2019-08-02: 10 [IU] via SUBCUTANEOUS

## 2019-08-02 MED ORDER — INSULIN ASPART 100 UNIT/ML ~~LOC~~ SOLN
0.0000 [IU] | Freq: Every day | SUBCUTANEOUS | Status: DC
  Administered 2019-08-02: 3 [IU] via SUBCUTANEOUS

## 2019-08-02 MED ORDER — INSULIN ASPART 100 UNIT/ML ~~LOC~~ SOLN: 0.0000 [IU] | Freq: Three times a day (TID) | SUBCUTANEOUS | Status: DC

## 2019-08-02 MED ORDER — INSULIN ASPART 100 UNIT/ML ~~LOC~~ SOLN: 0.0000 [IU] | Freq: Every day | SUBCUTANEOUS | Status: DC

## 2019-08-02 MED ORDER — INSULIN ASPART 100 UNIT/ML ~~LOC~~ SOLN: 13.0000 [IU] | Freq: Three times a day (TID) | SUBCUTANEOUS | Status: DC

## 2019-08-02 MED ORDER — AMOXICILLIN 500 MG PO CAPS
500.0000 mg | ORAL_CAPSULE | Freq: Three times a day (TID) | ORAL | Status: DC
  Administered 2019-08-02 – 2019-08-04 (×6): 500 mg via ORAL
  Filled 2019-08-02 (×6): qty 1

## 2019-08-02 MED ORDER — SODIUM CHLORIDE 0.9 % IV BOLUS
500.0000 mL | Freq: Once | INTRAVENOUS | Status: AC
  Administered 2019-08-02: 500 mL via INTRAVENOUS

## 2019-08-02 MED ORDER — SODIUM CHLORIDE 0.9 % IV BOLUS: 500.0000 mL | Freq: Once | INTRAVENOUS | Status: DC

## 2019-08-02 MED ORDER — SODIUM ZIRCONIUM CYCLOSILICATE 10 G PO PACK
10.0000 g | PACK | Freq: Once | ORAL | Status: AC
  Administered 2019-08-02: 10 g via ORAL
  Filled 2019-08-02: qty 1

## 2019-08-02 MED ORDER — INSULIN ASPART 100 UNIT/ML ~~LOC~~ SOLN
16.0000 [IU] | Freq: Three times a day (TID) | SUBCUTANEOUS | Status: DC
  Administered 2019-08-03 – 2019-08-04 (×4): 16 [IU] via SUBCUTANEOUS

## 2019-08-02 MED ORDER — INSULIN DETEMIR 100 UNIT/ML ~~LOC~~ SOLN
20.0000 [IU] | Freq: Once | SUBCUTANEOUS | Status: AC
  Administered 2019-08-02: 20 [IU] via SUBCUTANEOUS
  Filled 2019-08-02: qty 0.2

## 2019-08-02 MED ORDER — SODIUM CHLORIDE 0.9 % IV SOLN
INTRAVENOUS | Status: DC
  Administered 2019-08-02 – 2019-08-04 (×4): via INTRAVENOUS

## 2019-08-02 MED ORDER — INSULIN ASPART 100 UNIT/ML ~~LOC~~ SOLN
5.0000 [IU] | Freq: Once | SUBCUTANEOUS | Status: AC
  Administered 2019-08-02: 5 [IU] via SUBCUTANEOUS

## 2019-08-02 MED ORDER — INSULIN ASPART 100 UNIT/ML ~~LOC~~ SOLN
0.0000 [IU] | Freq: Three times a day (TID) | SUBCUTANEOUS | Status: DC
  Administered 2019-08-02: 12:00:00 5 [IU] via SUBCUTANEOUS
  Administered 2019-08-02 – 2019-08-03 (×2): 3 [IU] via SUBCUTANEOUS
  Administered 2019-08-03 (×2): 5 [IU] via SUBCUTANEOUS
  Administered 2019-08-04: 3 [IU] via SUBCUTANEOUS

## 2019-08-02 MED ORDER — INSULIN ASPART 100 UNIT/ML ~~LOC~~ SOLN
14.0000 [IU] | Freq: Three times a day (TID) | SUBCUTANEOUS | Status: DC
  Administered 2019-08-02: 14 [IU] via SUBCUTANEOUS

## 2019-08-02 MED ORDER — SODIUM CHLORIDE 0.9 % IV BOLUS
1000.0000 mL | Freq: Once | INTRAVENOUS | Status: AC
  Administered 2019-08-02: 1000 mL via INTRAVENOUS

## 2019-08-02 MED ORDER — INSULIN DETEMIR 100 UNIT/ML ~~LOC~~ SOLN
40.0000 [IU] | Freq: Every day | SUBCUTANEOUS | Status: DC
  Administered 2019-08-02: 40 [IU] via SUBCUTANEOUS
  Filled 2019-08-02: qty 0.4

## 2019-08-02 MED ORDER — INSULIN DETEMIR 100 UNIT/ML ~~LOC~~ SOLN
60.0000 [IU] | Freq: Every day | SUBCUTANEOUS | Status: DC
  Administered 2019-08-03 – 2019-08-04 (×2): 60 [IU] via SUBCUTANEOUS
  Filled 2019-08-02 (×2): qty 0.6

## 2019-08-02 MED ORDER — INSULIN ASPART 100 UNIT/ML ~~LOC~~ SOLN: 6.0000 [IU] | Freq: Three times a day (TID) | SUBCUTANEOUS | Status: DC

## 2019-08-02 NOTE — Progress Notes (Signed)
Patient is educated about his food/carb intake. He requested some grits for breakfast but it will make him go over his carb limit for the day. He said that if the cafeteria does not give him the grits and what he wants he will have someone from home to bring it to him. He is educated about carb intake and his blood sugar, and I will continue to reinforce.

## 2019-08-02 NOTE — Progress Notes (Signed)
PROGRESS NOTE    [name]  ZOX:[id number] DOB: 11-27-1960 DOA: 07/31/2019 PCP: [name], FNP   Brief Narrative: 58 year old with past medical history significant for coronary artery disease a status post stenting, diastolic dysfunction, diabetes type 2 hypertension, chronic back pain who presented from pain clinic after he received a steroid shot in his back and he was found to be hypotensive.  Patient reports that over the last few days he has not been eating and his been feeling nauseated.  Is been having some sore throat.  He had a COVID test was negative. Evaluation in the ED his blood pressure was in the 80s lactic acid 3.8, he received 2 L of IV fluids.  His initial cortisol level was 2.5.  Potassium 6.6, glucose 392, creatinine 1.6 bicarb 23 8 Of 7 initially.  Subsequently his gap increase and his bicarb decreased though he developed DKA.  He received IV fluids a started on insulin drip.  Blood pressure improved after IV bolus.   Assessment & Plan:   Principal Problem:   Hypotension Active Problems:   CAD (coronary artery disease)   ARF (acute renal failure) (HCC)   Hyperkalemia   Uncontrolled type 2 diabetes mellitus with hyperglycemia (HCC)   1-Hypotension: Related to dehydration and multiple diuretics. Improved with IV fluids.  Continue to hold BP medications.  Repeated cortisol level normal.   2-DKA;  He was transition to levemir today. Will need to monitor on long acting insulin.  Repeat B-met this afternoon.   3-Hyperkalemia; related to DKA and AKI; ACE and spironolactone.  Resolved.   4-Chronic Diastolic Heart failure. holding diuretics.  5-AKI; multifactorial related to diuretics, ACE, hypotension. DKA; Improved.   6-Sore throat; check for strep,  Start amoxicillin.     Estimated body mass index is 44.18 kg/m as calculated from the following:   Height as of this encounter: '5\' 10"'  (1.778 m).   Weight as of this encounter: 139.7  kg.   DVT prophylaxis: Lovenox Code Status: full code Family Communication: care discussed with patient.  Disposition Plan: remain in the hospital, adjust insulin regimen, he was just transition to long acting insulin.  Consultants:     Procedures:     Antimicrobials:  Amoxicillin.   Subjective: Report sore throat.   Objective: Vitals:   08/02/19 0258 08/02/19 0333 08/02/19 0620 08/02/19 0901  BP: 104/62 (!) 124/50  117/63  Pulse: 75 74  79  Resp:    17  Temp:    98.6 F (37 C)  TempSrc:    Oral  SpO2:    100%  Weight:   (!) 139.7 kg   Height:        Intake/Output Summary (Last 24 hours) at 08/02/2019 1105 Last data filed at 08/02/2019 0900 Gross per 24 hour  Intake 961.61 ml  Output 950 ml  Net 11.61 ml   Filed Weights   08/01/19 1655 08/02/19 0620  Weight: (!) 139.8 kg (!) 139.7 kg    Examination:  General exam: Appears calm and comfortable  ENT; white plaque throat Respiratory system: Clear to auscultation. Respiratory effort normal. Cardiovascular system: S1 & S2 heard, RRR. No JVD, murmurs, rubs, gallops or clicks. No pedal edema. Gastrointestinal system: Abdomen is nondistended, soft and nontender. No organomegaly or masses felt. Normal bowel sounds heard. Central nervous system: Alert and oriented. No focal neurological deficits. Extremities: Symmetric 5 x 5 power. Skin: No rashes, lesions or ulcers Psychiatry: Judgement and insight appear normal. Mood & affect appropriate.  Data Reviewed: I have personally reviewed following labs and imaging studies  CBC: Recent Labs  Lab 07/31/19 1608 07/31/19 1738 07/31/19 1744 08/01/19 0456 08/02/19 0601  WBC 10.8*  --  8.6 14.3* 12.2*  NEUTROABS  --   --  7.4  --   --   HGB 11.9* 12.2* 13.7 11.9* 12.3*  HCT 36.5* 36.0* 41.8 36.1* 38.3*  MCV 90.6  --  90.9 89.8 90.5  PLT 203  --  169 225 867   Basic Metabolic Panel: Recent Labs  Lab 08/01/19 0456 08/01/19 0900 08/01/19 1315 08/01/19 1850  08/02/19 0601  NA 132* 135 137 134* 137  K 6.5* 5.5* 5.0 5.9* 4.4  CL 101 104 101 99 100  CO2 19* 19* '24 23 22  ' GLUCOSE 370* 229* 165* 142* 151*  BUN 33* 32* 29* 28* 23*  CREATININE 1.45* 1.32* 1.25* 1.04 0.99  CALCIUM 9.5 9.3 9.8 9.4 9.8   GFR: Estimated Creatinine Clearance: 118.9 mL/min (by C-G formula based on SCr of 0.99 mg/dL). Liver Function Tests: Recent Labs  Lab 07/31/19 1744  AST 22  ALT 23  ALKPHOS 61  BILITOT 0.6  PROT 6.8  ALBUMIN 3.4*   Recent Labs  Lab 07/31/19 1744  LIPASE 29   No results for input(s): AMMONIA in the last 168 hours. Coagulation Profile: No results for input(s): INR, PROTIME in the last 168 hours. Cardiac Enzymes: No results for input(s): CKTOTAL, CKMB, CKMBINDEX, TROPONINI in the last 168 hours. BNP (last 3 results) No results for input(s): PROBNP in the last 8760 hours. HbA1C: No results for input(s): HGBA1C in the last 72 hours. CBG: Recent Labs  Lab 08/02/19 0702 08/02/19 0739 08/02/19 0853 08/02/19 0944 08/02/19 1041  GLUCAP 144* 138* 165* 221* 204*   Lipid Profile: No results for input(s): CHOL, HDL, LDLCALC, TRIG, CHOLHDL, LDLDIRECT in the last 72 hours. Thyroid Function Tests: No results for input(s): TSH, T4TOTAL, FREET4, T3FREE, THYROIDAB in the last 72 hours. Anemia Panel: No results for input(s): VITAMINB12, FOLATE, FERRITIN, TIBC, IRON, RETICCTPCT in the last 72 hours. Sepsis Labs: Recent Labs  Lab 07/31/19 1633 07/31/19 2005 08/01/19 0946  LATICACIDVEN 3.4* 3.8* 3.1*    Recent Results (from the past 240 hour(s))  Novel Coronavirus, NAA ([facility])     Status: None   Collection Time: 07/29/19 12:40 PM   Specimen: Nasopharyngeal(NP) swabs in vial transport medium   NASOPHARYNGE  TESTING  Result Value Ref Range Status   SARS-CoV-2, NAA Not Detected Not Detected Final    Comment: This nucleic acid amplification test was developed and its performance characteristics determined by [employer].  Nucleic acid amplification tests include PCR and TMA. This test has not been FDA cleared or approved. This test has been authorized by FDA under an Emergency Use Authorization (EUA). This test is only authorized for the duration of time the declaration that circumstances exist justifying the authorization of the emergency use of in vitro diagnostic tests for detection of SARS-CoV-2 virus and/or diagnosis of COVID-19 infection under section 564(b)(1) of the Act, 21 U.S.C. 672CNO-7(S) (1), unless the authorization is terminated or revoked sooner. When diagnostic testing is negative, the possibility of a false negative result should be considered in the context of a patient's recent exposures and the presence of clinical signs and symptoms consistent with COVID-19. An individual without symptoms of COVID-19 and who is not shedding SARS-CoV-2 virus would  expect to have a negative (not detected) result in this assay.   Culture, blood (routine x 2)  Status: None (Preliminary result)   Collection Time: 07/31/19  4:10 PM   Specimen: BLOOD LEFT HAND  Result Value Ref Range Status   Specimen Description BLOOD LEFT HAND  Final   Special Requests   Final    BOTTLES DRAWN AEROBIC ONLY Blood Culture results may not be optimal due to an inadequate volume of blood received in culture bottles   Culture   Final    NO GROWTH 2 DAYS Performed at [facility], [place] [address], [place], [place] [address]    Report Status PENDING  Incomplete  Culture, blood (routine x 2)     Status: None (Preliminary result)   Collection Time: 07/31/19  4:38 PM   Specimen: BLOOD  Result Value Ref Range Status   Specimen Description BLOOD SITE NOT SPECIFIED  Final   Special Requests   Final    BOTTLES DRAWN AEROBIC AND ANAEROBIC Blood Culture adequate volume   Culture   Final    NO GROWTH 2 DAYS Performed at [facility], [address] [place], [place] [address]    Report Status PENDING  Incomplete   SARS Coronavirus 2 [facility] order, Performed in [facility] hospital lab) Nasopharyngeal Nasopharyngeal Swab     Status: None   Collection Time: 07/31/19  6:25 PM   Specimen: Nasopharyngeal Swab  Result Value Ref Range Status   SARS Coronavirus 2 NEGATIVE NEGATIVE Final    Comment: (NOTE) If result is NEGATIVE SARS-CoV-2 target nucleic acids are NOT DETECTED. The SARS-CoV-2 RNA is generally detectable in upper and lower  respiratory specimens during the acute phase of infection. The lowest  concentration of SARS-CoV-2 viral copies this assay can detect is 250  copies / mL. A negative result does not preclude SARS-CoV-2 infection  and should not be used as the sole basis for treatment or other  patient management decisions.  A negative result may occur with  improper specimen collection / handling, submission of specimen other  than nasopharyngeal swab, presence of viral mutation(s) within the  areas targeted by this assay, and inadequate number of viral copies  (<250 copies / mL). A negative result must be combined with clinical  observations, patient history, and epidemiological information. If result is POSITIVE SARS-CoV-2 target nucleic acids are DETECTED. The SARS-CoV-2 RNA is generally detectable in upper and lower  respiratory specimens dur ing the acute phase of infection.  Positive  results are indicative of active infection with SARS-CoV-2.  Clinical  correlation with patient history and other diagnostic information is  necessary to determine patient infection status.  Positive results do  not rule out bacterial infection or co-infection with other viruses. If result is PRESUMPTIVE POSTIVE SARS-CoV-2 nucleic acids MAY BE PRESENT.   A presumptive positive result was obtained on the submitted specimen  and confirmed on repeat testing.  While 2019 novel coronavirus  (SARS-CoV-2) nucleic acids may be present in the submitted sample  additional confirmatory testing may be  necessary for epidemiological  and / or clinical management purposes  to differentiate between  SARS-CoV-2 and other Sarbecovirus currently known to infect humans.  If clinically indicated additional testing with an alternate test  methodology [phone]) is advised. The SARS-CoV-2 RNA is generally  detectable in upper and lower respiratory sp ecimens during the acute  phase of infection. The expected result is Negative. Fact Sheet for Patients:  StrictlyIdeas.no Fact Sheet for Healthcare Providers: BankingDealers.co.za This test is not yet approved or cleared by the [place] FDA and has  been authorized for detection and/or diagnosis of SARS-CoV-2 by FDA under an Emergency Use Authorization (EUA).  This EUA will remain in effect (meaning this test can be used) for the duration of the COVID-19 declaration under Section 564(b)(1) of the Act, 21 U.S.C. section 360bbb-3(b)(1), unless the authorization is terminated or revoked sooner. Performed at [facility], [place] [address], [place], [place] [address]          Radiology Studies: Ct Angio Chest Pe W And/or Wo Contrast  Result Date: 07/31/2019 CLINICAL DATA:  Concern for pulmonary embolism.  Weakness. EXAM: CT ANGIOGRAPHY CHEST CT ABDOMEN AND PELVIS WITH CONTRAST TECHNIQUE: Multidetector CT imaging of the chest was performed using the standard protocol during bolus administration of intravenous contrast. Multiplanar CT image reconstructions and MIPs were obtained to evaluate the vascular anatomy. Multidetector CT imaging of the abdomen and pelvis was performed using the standard protocol during bolus administration of intravenous contrast. CONTRAST:  175m OMNIPAQUE IOHEXOL 350 MG/ML SOLN COMPARISON:  Chest CT 07/03/2018 FINDINGS: CTA CHEST FINDINGS Cardiovascular: No filling defects within the pulmonary arteries to localize acute pulmonary embolism. There is image degradation in the  lower lobe pulmonary arteries secondary to patient body habitus with mottled streak artifact. No RIGHT ventricular strain. Mediastinum/Nodes: No axillary supraclavicular adenopathy. No mediastinal hilar adenopathy. No pericardial effusion. Esophagus normal. Lungs/Pleura: No pulmonary infarction. No airspace disease. No atelectasis. No effusion. Is Musculoskeletal: No rib fracture. Review of the MIP images confirms the above findings. CT ABDOMEN and PELVIS FINDINGS Hepatobiliary: Peripheral regions of hypo attenuation/hypoperfusion in the LEFT and RIGHT hepatic lobes. These ill-defined regions are seen on axial image 20/6 for example. No enhancing lesion. No biliary duct dilatation. Gallbladder normal. Pancreas: Normal pancreas Spleen: Low-density lesion in the spleen measures 3.6 cm. This is unchanged from CT 2018. Adrenals/Urinary Tract: Adrenal glands normal. Kidneys enhance uniformly ureters and bladder normal. Stomach/Bowel: Stomach, small bowel appendix and cecum normal. The colon and rectum sigmoid colon normal. Vascular/Lymphatic: Abdominal aorta is normal caliber. There is no retroperitoneal or periportal lymphadenopathy. No pelvic lymphadenopathy. Reproductive: Prostate normal. Other: No free fluid. Musculoskeletal: No aggressive osseous lesion. Review of the MIP images confirms the above findings. IMPRESSION: Chest Impression: 1. No evidence acute pulmonary embolism. 2. No acute pulmonary parenchymal findings. Abdomen / Pelvis Impression: 1. Hypoperfusion in the periphery of the LEFT and RIGHT hepatic lobe is nonspecific finding and may represent a benign vascular phenomena. Consider serologic correlation for hepatitis. Abdominal/hepatic MRI could potentially help characterize hepatic abnormality 2. Hypoperfusion in the spleen unchanged from 2018 likely represents a hemangioma or cyst . Electronically Signed   By: SSuzy BouchardM.D.   On: 07/31/2019 19:04   Ct Abdomen Pelvis W Contrast  Result Date:  07/31/2019 CLINICAL DATA:  Concern for pulmonary embolism.  Weakness. EXAM: CT ANGIOGRAPHY CHEST CT ABDOMEN AND PELVIS WITH CONTRAST TECHNIQUE: Multidetector CT imaging of the chest was performed using the standard protocol during bolus administration of intravenous contrast. Multiplanar CT image reconstructions and MIPs were obtained to evaluate the vascular anatomy. Multidetector CT imaging of the abdomen and pelvis was performed using the standard protocol during bolus administration of intravenous contrast. CONTRAST:  1032mOMNIPAQUE IOHEXOL 350 MG/ML SOLN COMPARISON:  Chest CT 07/03/2018 FINDINGS: CTA CHEST FINDINGS Cardiovascular: No filling defects within the pulmonary arteries to localize acute pulmonary embolism. There is image degradation in the lower lobe pulmonary arteries secondary to patient body habitus with mottled streak artifact. No RIGHT ventricular strain. Mediastinum/Nodes: No axillary supraclavicular adenopathy. No mediastinal  hilar adenopathy. No pericardial effusion. Esophagus normal. Lungs/Pleura: No pulmonary infarction. No airspace disease. No atelectasis. No effusion. Is Musculoskeletal: No rib fracture. Review of the MIP images confirms the above findings. CT ABDOMEN and PELVIS FINDINGS Hepatobiliary: Peripheral regions of hypo attenuation/hypoperfusion in the LEFT and RIGHT hepatic lobes. These ill-defined regions are seen on axial image 20/6 for example. No enhancing lesion. No biliary duct dilatation. Gallbladder normal. Pancreas: Normal pancreas Spleen: Low-density lesion in the spleen measures 3.6 cm. This is unchanged from CT 2018. Adrenals/Urinary Tract: Adrenal glands normal. Kidneys enhance uniformly ureters and bladder normal. Stomach/Bowel: Stomach, small bowel appendix and cecum normal. The colon and rectum sigmoid colon normal. Vascular/Lymphatic: Abdominal aorta is normal caliber. There is no retroperitoneal or periportal lymphadenopathy. No pelvic lymphadenopathy.  Reproductive: Prostate normal. Other: No free fluid. Musculoskeletal: No aggressive osseous lesion. Review of the MIP images confirms the above findings. IMPRESSION: Chest Impression: 1. No evidence acute pulmonary embolism. 2. No acute pulmonary parenchymal findings. Abdomen / Pelvis Impression: 1. Hypoperfusion in the periphery of the LEFT and RIGHT hepatic lobe is nonspecific finding and may represent a benign vascular phenomena. Consider serologic correlation for hepatitis. Abdominal/hepatic MRI could potentially help characterize hepatic abnormality 2. Hypoperfusion in the spleen unchanged from 2018 likely represents a hemangioma or cyst . Electronically Signed   By: Suzy Bouchard M.D.   On: 07/31/2019 19:04   Dg Chest Port 1 View  Result Date: 07/31/2019 CLINICAL DATA:  Weakness, sleepiness EXAM: PORTABLE CHEST 1 VIEW COMPARISON:  06/05/2019 FINDINGS: The heart size and mediastinal contours are within normal limits. Both lungs are clear. The visualized skeletal structures are unremarkable. IMPRESSION: No active disease. Electronically Signed   By: Kathreen Devoid   On: 07/31/2019 17:07        Scheduled Meds:  amoxicillin  500 mg Oral Q8H   aspirin  81 mg Oral Daily   atorvastatin  80 mg Oral Daily   DULoxetine  60 mg Oral Daily   enoxaparin (LOVENOX) injection  40 mg Subcutaneous Daily   insulin aspart  0-15 Units Subcutaneous TID WC   insulin aspart  0-5 Units Subcutaneous QHS   insulin aspart  10 Units Subcutaneous TID WC   insulin detemir  20 Units Subcutaneous Once   [START ON 08/03/2019] insulin detemir  60 Units Subcutaneous Daily   pregabalin  300 mg Oral BID   sertraline  50 mg Oral Daily   sodium chloride flush  3 mL Intravenous Once   traZODone  50 mg Oral QHS   Continuous Infusions:  sodium chloride 10 mL/hr at 08/01/19 0238     LOS: 1 day    Time spent: 35 minutes    Elmarie Shiley, MD [facility] Pager [phone]  If 7PM-7AM,  please contact night-coverage [email or web address] Password [email or web address] 08/02/2019, 11:05 AM

## 2019-08-02 NOTE — Plan of Care (Signed)
  Problem: Nutrition: Goal: Adequate nutrition will be maintained Outcome: Completed/Met   Problem: Coping: Goal: Level of anxiety will decrease Outcome: Completed/Met   Problem: Elimination: Goal: Will not experience complications related to urinary retention Outcome: Completed/Met   Problem: Pain Managment: Goal: General experience of comfort will improve Outcome: Completed/Met   Problem: Skin Integrity: Goal: Risk for impaired skin integrity will decrease Outcome: Completed/Met

## 2019-08-02 NOTE — Progress Notes (Signed)
Inpatient Diabetes Program Recommendations  AACE/ADA: New Consensus Statement on Inpatient Glycemic Control (2015)  Target Ranges:  Prepandial:   less than 140 mg/dL      Peak postprandial:   less than 180 mg/dL (1-2 hours)      Critically ill patients:  140 - 180 mg/dL   Lab Results  Component Value Date   GLUCAP 275 (H) 08/02/2019   HGBA1C 10.5 (H) 05/05/2019    Review of Glycemic Control Results for [name], [name] (MRN [id number]) as of 08/02/2019 12:06  Ref. Range 08/02/2019 09:44 08/02/2019 10:41 08/02/2019 11:42  Glucose-Capillary Latest Ref Range: 70 - 99 mg/dL 221 (H) 204 (H) 194 (H)   Diabetes history: Type 2 DM Outpatient Diabetes medications: Levemir 60 units QHS, Novolog 16-22 units TID, Metformin 1000 mg BID, Glipizide 10 mg BID Current orders for Inpatient glycemic control: Novolog 0-15 units TID, Novolog 0-5 units QHS, Novolog 10 units TID, Levemir 60 units QD  Inpatient Diabetes Program Recommendations:    Spoke with patient regarding outpatient diabetes management. Patient is followed by Peri Jefferson, outpatient endocrinology with last visit 05/31/2019. Reviewed patient's previous A1c of 10.3% (05/2019). Explained what a A1c is and what it measures. Also reviewed goal A1c with patient, importance of good glucose control @ home, and blood sugar goals. Patient has a meter and supplies and reports CBG is highest around lunchtime. But he sometimes misses checks because he "gets busy". Discussed the importance of checking and trying to avoid missed doses. Also, reviewed with patient when to reach out to endo office for insulin adjustments in an attempt to improve A1C.  Patient is not interested in dietitian or outpatient referral for diabetes. Has no further questions at this time.   Thanks, Bronson Curb, MSN, RNC-OB Diabetes Coordinator [phone] (8a-5p)

## 2019-08-03 LAB — BASIC METABOLIC PANEL
Anion gap: 14 (ref 5–15)
Anion gap: 12 (ref 5–15)
BUN: 17 mg/dL (ref 6–20)
BUN: 17 mg/dL (ref 6–20)
CO2: 22 mmol/L (ref 22–32)
CO2: 20 mmol/L — ABNORMAL LOW (ref 22–32)
Calcium: 9.4 mg/dL (ref 8.9–10.3)
Calcium: 8.9 mg/dL (ref 8.9–10.3)
Chloride: 100 mmol/L (ref 98–111)
Chloride: 99 mmol/L (ref 98–111)
Creatinine, Ser: 0.88 mg/dL (ref 0.61–1.24)
Creatinine, Ser: 0.93 mg/dL (ref 0.61–1.24)
GFR calc Af Amer: >60 mL/min (ref >60)
GFR calc Af Amer: >60 mL/min (ref >60)
GFR calc non Af Amer: >60 mL/min (ref >60)
GFR calc non Af Amer: >60 mL/min (ref >60)
Glucose, Bld: 198 mg/dL — ABNORMAL HIGH (ref 70–99)
Glucose, Bld: 299 mg/dL — ABNORMAL HIGH (ref 70–99)
Potassium: 4.8 mmol/L (ref 3.5–5.1)
Potassium: 4.6 mmol/L (ref 3.5–5.1)
Sodium: 136 mmol/L (ref 135–145)
Sodium: 131 mmol/L — ABNORMAL LOW (ref 135–145)

## 2019-08-03 LAB — GLUCOSE, CAPILLARY
Glucose-Capillary: 187 mg/dL — ABNORMAL HIGH (ref 70–99)
Glucose-Capillary: 245 mg/dL — ABNORMAL HIGH (ref 70–99)
Glucose-Capillary: 237 mg/dL — ABNORMAL HIGH (ref 70–99)
Glucose-Capillary: 178 mg/dL — ABNORMAL HIGH (ref 70–99)

## 2019-08-03 LAB — COMPREHENSIVE METABOLIC PANEL
ALT: 23 U/L (ref 0–44)
AST: 33 U/L (ref 15–41)
Albumin: 3.4 g/dL — ABNORMAL LOW (ref 3.5–5.0)
Alkaline Phosphatase: 58 U/L (ref 38–126)
Anion gap: 12 (ref 5–15)
BUN: 18 mg/dL (ref 6–20)
CO2: 21 mmol/L — ABNORMAL LOW (ref 22–32)
Calcium: 9.1 mg/dL (ref 8.9–10.3)
Chloride: 101 mmol/L (ref 98–111)
Creatinine, Ser: 0.91 mg/dL (ref 0.61–1.24)
GFR calc Af Amer: >60 mL/min (ref >60)
GFR calc non Af Amer: >60 mL/min (ref >60)
Glucose, Bld: 190 mg/dL — ABNORMAL HIGH (ref 70–99)
Potassium: 5 mmol/L (ref 3.5–5.1)
Sodium: 134 mmol/L — ABNORMAL LOW (ref 135–145)
Total Bilirubin: 0.6 mg/dL (ref 0.3–1.2)
Total Protein: 6.8 g/dL (ref 6.5–8.1)

## 2019-08-03 LAB — CBC
HCT: 37.3 % — ABNORMAL LOW (ref 39.0–52.0)
Hemoglobin: 12.7 g/dL — ABNORMAL LOW (ref 13.0–17.0)
MCH: 30.3 pg (ref 26.0–34.0)
MCHC: 34 g/dL (ref 30.0–36.0)
MCV: 89 fL (ref 80.0–100.0)
Platelets: 183 10*3/uL (ref 150–400)
RBC: 4.19 MIL/uL — ABNORMAL LOW (ref 4.22–5.81)
RDW: 14.7 % (ref 11.5–15.5)
WBC: 11.3 10*3/uL — ABNORMAL HIGH (ref 4.0–10.5)
nRBC: 0 % (ref 0.0–0.2)

## 2019-08-03 MED ORDER — NYSTATIN 100000 UNIT/ML MT SUSP
5.0000 mL | Freq: Four times a day (QID) | OROMUCOSAL | Status: DC
  Administered 2019-08-03 – 2019-08-04 (×4): 500000 [IU] via ORAL
  Filled 2019-08-03 (×4): qty 5

## 2019-08-03 MED ORDER — GLIPIZIDE 10 MG PO TABS
10.0000 mg | ORAL_TABLET | Freq: Two times a day (BID) | ORAL | Status: DC
  Administered 2019-08-03 – 2019-08-04 (×3): 10 mg via ORAL
  Filled 2019-08-03 (×3): qty 1

## 2019-08-03 NOTE — Progress Notes (Signed)
PROGRESS NOTE    [name]  [id number] DOB: [birth year] DOA: 07/31/2019 PCP: [name], FNP   Brief Narrative: 58 year old with past medical history significant for coronary artery disease a status post stenting, diastolic dysfunction, diabetes type 2 hypertension, chronic back pain who presented from pain clinic after he received a steroid shot in his back and he was found to be hypotensive.  Patient reports that over the last few days he has not been eating and his been feeling nauseated.  Is been having some sore throat.  He had a COVID test was negative. Evaluation in the ED his blood pressure was in the 80s lactic acid 3.8, he received 2 L of IV fluids.  His initial cortisol level was 2.5.  Potassium 6.6, glucose 392, creatinine 1.6 bicarb 23 8 Of 7 initially.  Subsequently his gap increase and his bicarb decreased though he developed DKA.  He received IV fluids a started on insulin drip.  Blood pressure improved after IV bolus.   Assessment & Plan:   Principal Problem:   Hypotension Active Problems:   CAD (coronary artery disease)   ARF (acute renal failure) (HCC)   Hyperkalemia   Uncontrolled type 2 diabetes mellitus with hyperglycemia (HCC)   1-Hypotension: Related to dehydration and multiple diuretics. Improved with IV fluids.  Continue to hold BP medications.  Repeated cortisol level normal.   2-DKA;  He was transition to levemir today. Will need to monitor on long acting insulin.  Patient gap increase and bicarb decrease, develops hyperkalemia. He received 1.5 L fluids, extra dose of novolog last night. Gap close this am.  Meals coverage increase to 16 units. Resume glipizide. Remain overnight to monitor cbg and repeat Bmet   3-Hyperkalemia; related to DKA and AKI; ACE and spironolactone.  Resolved.   4-Chronic Diastolic Heart failure. holding diuretics.  5-AKI; multifactorial related to diuretics, ACE, hypotension. DKA; Improved.   6-Tonsillitis;  Sore throat; check for strep,  Started amoxicillin.  Improved.    Estimated body mass index is 44.71 kg/m as calculated from the following:   Height as of this encounter: 5\' 10"  (1.778 m).   Weight as of this encounter: 141.3 kg.   DVT prophylaxis: Lovenox Code Status: full code Family Communication: care discussed with patient.  Disposition Plan: Patient will need to remain in the hospital tonight to monitor for development of recurrent DKA.  Will check labs tonight.  Last night he developed worsening hyperkalemia with a potassium at 6, his bicarb decreased and his gap increased.  He received 1.54 L of normal saline and extra dose of insulin. Consultants:     Procedures:     Antimicrobials:  Amoxicillin.   Subjective: He relate that the sore throat has improved.  Objective: Vitals:   08/02/19 1606 08/02/19 2102 08/03/19 0032 08/03/19 0408  BP: (!) 118/55 133/73 131/68 (!) 121/59  Pulse: 88 64 69 70  Resp: 18 18 18 20   Temp: 98.2 F (36.8 C) 98.1 F (36.7 C) 97.6 F (36.4 C) (!) 97.5 F (36.4 C)  TempSrc: Oral Oral Oral Oral  SpO2: 97% 99% 100% 99%  Weight:    (!) 141.3 kg  Height:        Intake/Output Summary (Last 24 hours) at 08/03/2019 0851 Last data filed at 08/03/2019 0722 Gross per 24 hour  Intake 3252.48 ml  Output 650 ml  Net 2602.48 ml   Filed Weights   08/01/19 1655 08/02/19 0620 08/03/19 0408  Weight: (!) 139.8 kg (!) 139.7  kg (!) 141.3 kg    Examination:  General exam: NAD ENT; white plaque throat Respiratory system: CTA Cardiovascular system: S 1, S 2 RRR Gastrointestinal system: BS present, soft, nt Central nervous system: Non focal.  Extremities: Symmetric power.  Skin: no rashes   Data Reviewed: I have personally reviewed following labs and imaging studies  CBC: Recent Labs  Lab 07/31/19 1608 07/31/19 1738 07/31/19 1744 08/01/19 0456 08/02/19 0601 08/03/19 0423  WBC 10.8*  --  8.6 14.3* 12.2* 11.3*  NEUTROABS  --   --   7.4  --   --   --   HGB 11.9* 12.2* 13.7 11.9* 12.3* 12.7*  HCT 36.5* 36.0* 41.8 36.1* 38.3* 37.3*  MCV 90.6  --  90.9 89.8 90.5 89.0  PLT 203  --  169 225 223 [id number]   Basic Metabolic Panel: Recent Labs  Lab 08/02/19 1438 08/02/19 1735 08/02/19 2050 08/03/19 0051 08/03/19 0423  NA 133* 133* 133* 134* 136  K 5.2* 5.2* 6.0* 5.0 4.8  CL 99 99 101 101 100  CO2 19* 21* 15* 21* 22  GLUCOSE 257* 236* 324* 190* 198*  BUN 21* 20 19 18 17   CREATININE 1.08 0.96 0.94 0.91 0.88  CALCIUM 9.8 9.4 9.1 9.1 9.4   GFR: Estimated Creatinine Clearance: 134.6 mL/min (by C-G formula based on SCr of 0.88 mg/dL). Liver Function Tests: Recent Labs  Lab 07/31/19 1744 08/03/19 0051  AST 22 33  ALT 23 23  ALKPHOS 61 58  BILITOT 0.6 0.6  PROT 6.8 6.8  ALBUMIN 3.4* 3.4*   Recent Labs  Lab 07/31/19 1744  LIPASE 29   No results for input(s): AMMONIA in the last 168 hours. Coagulation Profile: No results for input(s): INR, PROTIME in the last 168 hours. Cardiac Enzymes: No results for input(s): CKTOTAL, CKMB, CKMBINDEX, TROPONINI in the last 168 hours. BNP (last 3 results) No results for input(s): PROBNP in the last 8760 hours. HbA1C: No results for input(s): HGBA1C in the last 72 hours. CBG: Recent Labs  Lab 08/02/19 1346 08/02/19 1443 08/02/19 1652 08/02/19 2107 08/03/19 0653  GLUCAP 275* 259* 186* 254* 187*   Lipid Profile: No results for input(s): CHOL, HDL, LDLCALC, TRIG, CHOLHDL, LDLDIRECT in the last 72 hours. Thyroid Function Tests: No results for input(s): TSH, T4TOTAL, FREET4, T3FREE, THYROIDAB in the last 72 hours. Anemia Panel: No results for input(s): VITAMINB12, FOLATE, FERRITIN, TIBC, IRON, RETICCTPCT in the last 72 hours. Sepsis Labs: Recent Labs  Lab 07/31/19 1633 07/31/19 2005 08/01/19 0946  LATICACIDVEN 3.4* 3.8* 3.1*    Recent Results (from the past 240 hour(s))  Novel Coronavirus, NAA ([facility])     Status: None   Collection Time: 07/29/19 12:40 PM    Specimen: Nasopharyngeal(NP) swabs in vial transport medium   NASOPHARYNGE  TESTING  Result Value Ref Range Status   SARS-CoV-2, NAA Not Detected Not Detected Final    Comment: This nucleic acid amplification test was developed and its performance characteristics determined by [employer]. Nucleic acid amplification tests include PCR and TMA. This test has not been FDA cleared or approved. This test has been authorized by FDA under an Emergency Use Authorization (EUA). This test is only authorized for the duration of time the declaration that circumstances exist justifying the authorization of the emergency use of in vitro diagnostic tests for detection of SARS-CoV-2 virus and/or diagnosis of COVID-19 infection under section 564(b)(1) of the Act, 21 U.S.C. [id number]) (1), unless the authorization is terminated or revoked sooner. When diagnostic  testing is negative, the possibility of a false negative result should be considered in the context of a patient's recent exposures and the presence of clinical signs and symptoms consistent with COVID-19. An individual without symptoms of COVID-19 and who is not shedding SARS-CoV-2 virus would  expect to have a negative (not detected) result in this assay.   Culture, blood (routine x 2)     Status: None (Preliminary result)   Collection Time: 07/31/19  4:10 PM   Specimen: BLOOD LEFT HAND  Result Value Ref Range Status   Specimen Description BLOOD LEFT HAND  Final   Special Requests   Final    BOTTLES DRAWN AEROBIC ONLY Blood Culture results may not be optimal due to an inadequate volume of blood received in culture bottles   Culture   Final    NO GROWTH 2 DAYS Performed at [facility], [place] [address], [place], [place] [address]    Report Status PENDING  Incomplete  Culture, blood (routine x 2)     Status: None (Preliminary result)   Collection Time: 07/31/19  4:38 PM   Specimen: BLOOD  Result Value Ref Range Status    Specimen Description BLOOD SITE NOT SPECIFIED  Final   Special Requests   Final    BOTTLES DRAWN AEROBIC AND ANAEROBIC Blood Culture adequate volume   Culture   Final    NO GROWTH 2 DAYS Performed at [facility], [address] [place], [place] [address]    Report Status PENDING  Incomplete  SARS Coronavirus 2 [facility] order, Performed in [facility] hospital lab) Nasopharyngeal Nasopharyngeal Swab     Status: None   Collection Time: 07/31/19  6:25 PM   Specimen: Nasopharyngeal Swab  Result Value Ref Range Status   SARS Coronavirus 2 NEGATIVE NEGATIVE Final    Comment: (NOTE) If result is NEGATIVE SARS-CoV-2 target nucleic acids are NOT DETECTED. The SARS-CoV-2 RNA is generally detectable in upper and lower  respiratory specimens during the acute phase of infection. The lowest  concentration of SARS-CoV-2 viral copies this assay can detect is 250  copies / mL. A negative result does not preclude SARS-CoV-2 infection  and should not be used as the sole basis for treatment or other  patient management decisions.  A negative result may occur with  improper specimen collection / handling, submission of specimen other  than nasopharyngeal swab, presence of viral mutation(s) within the  areas targeted by this assay, and inadequate number of viral copies  (<250 copies / mL). A negative result must be combined with clinical  observations, patient history, and epidemiological information. If result is POSITIVE SARS-CoV-2 target nucleic acids are DETECTED. The SARS-CoV-2 RNA is generally detectable in upper and lower  respiratory specimens dur ing the acute phase of infection.  Positive  results are indicative of active infection with SARS-CoV-2.  Clinical  correlation with patient history and other diagnostic information is  necessary to determine patient infection status.  Positive results do  not rule out bacterial infection or co-infection with other viruses. If result is  PRESUMPTIVE POSTIVE SARS-CoV-2 nucleic acids MAY BE PRESENT.   A presumptive positive result was obtained on the submitted specimen  and confirmed on repeat testing.  While 2019 novel coronavirus  (SARS-CoV-2) nucleic acids may be present in the submitted sample  additional confirmatory testing may be necessary for epidemiological  and / or clinical management purposes  to differentiate between  SARS-CoV-2 and other Sarbecovirus currently known to infect  humans.  If clinically indicated additional testing with an alternate test  methodology [phone]) is advised. The SARS-CoV-2 RNA is generally  detectable in upper and lower respiratory sp ecimens during the acute  phase of infection. The expected result is Negative. Fact Sheet for Patients:  StrictlyIdeas.no Fact Sheet for Healthcare Providers: BankingDealers.co.za This test is not yet approved or cleared by the [place] FDA and has been authorized for detection and/or diagnosis of SARS-CoV-2 by FDA under an Emergency Use Authorization (EUA).  This EUA will remain in effect (meaning this test can be used) for the duration of the COVID-19 declaration under Section 564(b)(1) of the Act, 21 U.S.C. section 360bbb-3(b)(1), unless the authorization is terminated or revoked sooner. Performed at [facility], [place] [address], [place], [place] [address]          Radiology Studies: No results found.      Scheduled Meds: . amoxicillin  500 mg Oral Q8H  . aspirin  81 mg Oral Daily  . atorvastatin  80 mg Oral Daily  . DULoxetine  60 mg Oral Daily  . enoxaparin (LOVENOX) injection  40 mg Subcutaneous Daily  . insulin aspart  0-15 Units Subcutaneous TID WC  . insulin aspart  0-5 Units Subcutaneous QHS  . insulin aspart  16 Units Subcutaneous TID WC  . insulin detemir  60 Units Subcutaneous Daily  . nystatin  5 mL Oral QID  . pregabalin  300 mg Oral BID  . sertraline  50 mg  Oral Daily  . sodium chloride flush  3 mL Intravenous Once  . traZODone  50 mg Oral QHS   Continuous Infusions: . sodium chloride 100 mL/hr at 08/03/19 0406     LOS: 2 days    Time spent: 35 minutes    Elmarie Shiley, MD [facility] Pager [phone]  If 7PM-7AM, please contact night-coverage [email or web address] Password [email or web address] 08/03/2019, 8:51 AM

## 2019-08-03 NOTE — Progress Notes (Signed)
Patient refused CPAP.

## 2019-08-03 NOTE — Plan of Care (Signed)
  Problem: Education: Goal: Knowledge of General Education information will improve Description Including pain rating scale, medication(s)/side effects and non-pharmacologic comfort measures Outcome: Progressing   Problem: Health Behavior/Discharge Planning: Goal: Ability to manage health-related needs will improve Outcome: Progressing   

## 2019-08-04 LAB — BASIC METABOLIC PANEL
Anion gap: 12 (ref 5–15)
BUN: 14 mg/dL (ref 6–20)
CO2: 19 mmol/L — ABNORMAL LOW (ref 22–32)
Calcium: 8.9 mg/dL (ref 8.9–10.3)
Chloride: 104 mmol/L (ref 98–111)
Creatinine, Ser: 0.72 mg/dL (ref 0.61–1.24)
GFR calc Af Amer: >60 mL/min (ref >60)
GFR calc non Af Amer: >60 mL/min (ref >60)
Glucose, Bld: 180 mg/dL — ABNORMAL HIGH (ref 70–99)
Potassium: 4.4 mmol/L (ref 3.5–5.1)
Sodium: 135 mmol/L (ref 135–145)

## 2019-08-04 LAB — GLUCOSE, CAPILLARY: Glucose-Capillary: 173 mg/dL — ABNORMAL HIGH (ref 70–99)

## 2019-08-04 MED ORDER — AMOXICILLIN 500 MG PO CAPS: 500.0000 mg | ORAL_CAPSULE | Freq: Three times a day (TID) | ORAL | 0 refills | Status: AC

## 2019-08-04 MED ORDER — FUROSEMIDE 40 MG PO TABS: 40.0000 mg | ORAL_TABLET | Freq: Every day | ORAL | 0 refills | Status: DC

## 2019-08-04 MED ORDER — NYSTATIN 100000 UNIT/ML MT SUSP: 5.0000 mL | Freq: Four times a day (QID) | OROMUCOSAL | 0 refills | Status: DC

## 2019-08-04 NOTE — Plan of Care (Signed)

## 2019-08-04 NOTE — Procedures (Signed)
Patient refused CPAP.

## 2019-08-04 NOTE — Discharge Summary (Signed)
Physician Discharge Summary  [name] [id number] DOB: 1961-02-11 DOA: 07/31/2019  PCP: [name], FNP  Admit date: 07/31/2019 Discharge date: 08/04/2019  Admitted From: Home  Disposition: Home   Recommendations for Outpatient Follow-up:  1. Follow up with PCP in 1-2 weeks 2. Please obtain BMP/CBC in one week 3. Needs further adjustment of Diabetes regimen.  4. Follow up on resolution of tonsillitis.  5. Evaluate volume status and adjust lasix as needed.   Home Health: none  Discharge Condition: Stable.  CODE STATUS: full code Diet recommendation: Carb Modified  Brief/Interim Summary: 58 year old with past medical history significant for coronary artery disease a status post stenting, diastolic dysfunction, diabetes type 2 hypertension, chronic back pain who presented from pain clinic after he received a steroid shot in his back and he was found to be hypotensive.  Patient reports that over the last few days he has not been eating and his been feeling nauseated.  Is been having some sore throat.  He had a COVID test was negative. Evaluation in the ED his blood pressure was in the 80s lactic acid 3.8, he received 2 L of IV fluids.  His initial cortisol level was 2.5.  Potassium 6.6, glucose 392, creatinine 1.6 bicarb 23 8 Of 7 initially.  Subsequently his gap increase and his bicarb decreased though he developed DKA.  He received IV fluids a started on insulin drip.  Blood pressure improved after IV bolus.    1-Hypotension: Related to dehydration and multiple diuretics. Improved with IV fluids.  Continue to hold BP medications.  Repeated cortisol level normal.  resolved. Resume lasix and imdur.   2-DKA; might have be related to steroid injection vs tonsillitis.  He was transition to levemir today. Will need to monitor on long acting insulin.  Patient gap increase and bicarb decrease, develops hyperkalemia. He received 1.5 L fluids, extra dose of novolog last  night. Gap close this am.  Meals coverage increase to 16 units. Resume glipizide. Remain overnight to monitor cbg and repeat Bmet  Resolved.  Resume meals coverage home dose.   3-Hyperkalemia; related to DKA and AKI; ACE and spironolactone.  Resolved.   4-Chronic Diastolic Heart failure. Resume lower dose lasix.  5-AKI; multifactorial related to diuretics, ACE, hypotension. DKA; Improved.   6-Tonsillitis; Sore throat; check for strep,  Started amoxicillin.  Improved.    Discharge Diagnoses:  Principal Problem:   Hypotension Active Problems:   CAD (coronary artery disease)   ARF (acute renal failure) (HCC)   Hyperkalemia   Uncontrolled type 2 diabetes mellitus with hyperglycemia [facility])    Discharge Instructions  Discharge Instructions    Diet Carb Modified   Complete by: As directed    Increase activity slowly   Complete by: As directed      Allergies as of 08/04/2019      Reactions   Coconut Flavor [flavoring Agent] Hives   Coconut Oil Hives   Ibuprofen Other (See Comments)   Made gastric ulcers worse   Aleve [naproxen] Other (See Comments)   DUE TO KIDNEYS   Nsaids Other (See Comments)   Stomach ulcers      Medication List    STOP taking these medications   furosemide 40 MG tablet Commonly known as: LASIX   lisinopril 10 MG tablet Commonly known as: ZESTRIL   potassium chloride SA 20 MEQ tablet Commonly known as: KLOR-CON   spironolactone 25 MG tablet Commonly known as: ALDACTONE     TAKE these medications  Accu-Chek FastClix Lancets Misc CHECK BLOOD GLUCOSE 2 TIMES/DAY. DX E11.65   Albuterol Sulfate 108 (90 Base) MCG/ACT Aepb Commonly known as: ProAir RespiClick Inhale 2 puffs into the lungs every 6 (six) hours as needed. What changed: reasons to take this   amoxicillin 500 MG capsule Commonly known as: AMOXIL Take 1 capsule (500 mg total) by mouth every 8 (eight) hours for 5 days.   aspirin 81 MG chewable tablet Chew 1 tablet (81 mg  total) by mouth daily.   atorvastatin 80 MG tablet Commonly known as: LIPITOR Take 1 tablet (80 mg total) by mouth daily.   carvedilol 25 MG tablet Commonly known as: COREG Take 1 tablet (25 mg total) by mouth 2 (two) times daily with a meal.   diclofenac 1.3 % Ptch Commonly known as: FLECTOR Place 1 patch onto the skin daily as needed (for pain).   diclofenac sodium 1 % Gel Commonly known as: VOLTAREN Apply 1 application topically 4 (four) times daily. What changed:   when to take this  reasons to take this   DULoxetine 60 MG capsule Commonly known as: CYMBALTA TAKE 1 CAPSULE BY MOUTH EVERY DAY What changed: how much to take   glipiZIDE 10 MG tablet Commonly known as: GLUCOTROL Take 10 mg by mouth 2 (two) times daily.   Insulin Pen Needle 31G X 5 MM Misc Use 1 needle daily to inject insulin as prescribed   isosorbide mononitrate 60 MG 24 hr tablet Commonly known as: IMDUR Take 1 tablet (60 mg total) by mouth daily.   Levemir FlexTouch 100 UNIT/ML Pen Generic drug: Insulin Detemir Inject 60 Units into the skin daily. What changed: when to take this   metFORMIN 500 MG 24 hr tablet Commonly known as: GLUCOPHAGE-XR Take 2 tablets (1,000 mg total) by mouth 2 (two) times daily.   nitroGLYCERIN 0.4 MG SL tablet Commonly known as: NITROSTAT Place 1 tablet (0.4 mg total) under the tongue every 5 (five) minutes as needed for chest pain.   NovoLOG FlexPen 100 UNIT/ML FlexPen Generic drug: insulin aspart Inject 16-22 Units into the skin 3 (three) times daily with meals. Per sliding scale   nystatin 100000 UNIT/ML suspension Commonly known as: MYCOSTATIN Take 5 mLs (500,000 Units total) by mouth 4 (four) times daily.   oxyCODONE 15 MG immediate release tablet Commonly known as: ROXICODONE Take 15 mg 3 (three) times daily as needed by mouth for pain.   pregabalin 300 MG capsule Commonly known as: LYRICA Take 300 mg by mouth 2 (two) times daily.   sertraline 50  MG tablet Commonly known as: ZOLOFT Take 50 mg by mouth daily.   tiZANidine 2 MG tablet Commonly known as: ZANAFLEX Take 2 mg by mouth 2 (two) times daily as needed for pain.   traZODone 50 MG tablet Commonly known as: DESYREL Take 1 tablet (50 mg total) by mouth at bedtime.       Allergies  Allergen Reactions  . Coconut Flavor [Flavoring Agent] Hives  . Coconut Oil Hives  . Ibuprofen Other (See Comments)    Made gastric ulcers worse  . Aleve [Naproxen] Other (See Comments)    DUE TO KIDNEYS  . Nsaids Other (See Comments)    Stomach ulcers     Consultations:  none   Procedures/Studies: Ct Angio Chest Pe W And/or Wo Contrast  Result Date: 07/31/2019 CLINICAL DATA:  Concern for pulmonary embolism.  Weakness. EXAM: CT ANGIOGRAPHY CHEST CT ABDOMEN AND PELVIS WITH CONTRAST TECHNIQUE: Multidetector CT imaging of the  chest was performed using the standard protocol during bolus administration of intravenous contrast. Multiplanar CT image reconstructions and MIPs were obtained to evaluate the vascular anatomy. Multidetector CT imaging of the abdomen and pelvis was performed using the standard protocol during bolus administration of intravenous contrast. CONTRAST:  112mL OMNIPAQUE IOHEXOL 350 MG/ML SOLN COMPARISON:  Chest CT 07/03/2018 FINDINGS: CTA CHEST FINDINGS Cardiovascular: No filling defects within the pulmonary arteries to localize acute pulmonary embolism. There is image degradation in the lower lobe pulmonary arteries secondary to patient body habitus with mottled streak artifact. No RIGHT ventricular strain. Mediastinum/Nodes: No axillary supraclavicular adenopathy. No mediastinal hilar adenopathy. No pericardial effusion. Esophagus normal. Lungs/Pleura: No pulmonary infarction. No airspace disease. No atelectasis. No effusion. Is Musculoskeletal: No rib fracture. Review of the MIP images confirms the above findings. CT ABDOMEN and PELVIS FINDINGS Hepatobiliary: Peripheral  regions of hypo attenuation/hypoperfusion in the LEFT and RIGHT hepatic lobes. These ill-defined regions are seen on axial image 20/6 for example. No enhancing lesion. No biliary duct dilatation. Gallbladder normal. Pancreas: Normal pancreas Spleen: Low-density lesion in the spleen measures 3.6 cm. This is unchanged from CT 2018. Adrenals/Urinary Tract: Adrenal glands normal. Kidneys enhance uniformly ureters and bladder normal. Stomach/Bowel: Stomach, small bowel appendix and cecum normal. The colon and rectum sigmoid colon normal. Vascular/Lymphatic: Abdominal aorta is normal caliber. There is no retroperitoneal or periportal lymphadenopathy. No pelvic lymphadenopathy. Reproductive: Prostate normal. Other: No free fluid. Musculoskeletal: No aggressive osseous lesion. Review of the MIP images confirms the above findings. IMPRESSION: Chest Impression: 1. No evidence acute pulmonary embolism. 2. No acute pulmonary parenchymal findings. Abdomen / Pelvis Impression: 1. Hypoperfusion in the periphery of the LEFT and RIGHT hepatic lobe is nonspecific finding and may represent a benign vascular phenomena. Consider serologic correlation for hepatitis. Abdominal/hepatic MRI could potentially help characterize hepatic abnormality 2. Hypoperfusion in the spleen unchanged from 2018 likely represents a hemangioma or cyst . Electronically Signed   By: Suzy Bouchard M.D.   On: 07/31/2019 19:04   Ct Abdomen Pelvis W Contrast  Result Date: 07/31/2019 CLINICAL DATA:  Concern for pulmonary embolism.  Weakness. EXAM: CT ANGIOGRAPHY CHEST CT ABDOMEN AND PELVIS WITH CONTRAST TECHNIQUE: Multidetector CT imaging of the chest was performed using the standard protocol during bolus administration of intravenous contrast. Multiplanar CT image reconstructions and MIPs were obtained to evaluate the vascular anatomy. Multidetector CT imaging of the abdomen and pelvis was performed using the standard protocol during bolus administration of  intravenous contrast. CONTRAST:  136mL OMNIPAQUE IOHEXOL 350 MG/ML SOLN COMPARISON:  Chest CT 07/03/2018 FINDINGS: CTA CHEST FINDINGS Cardiovascular: No filling defects within the pulmonary arteries to localize acute pulmonary embolism. There is image degradation in the lower lobe pulmonary arteries secondary to patient body habitus with mottled streak artifact. No RIGHT ventricular strain. Mediastinum/Nodes: No axillary supraclavicular adenopathy. No mediastinal hilar adenopathy. No pericardial effusion. Esophagus normal. Lungs/Pleura: No pulmonary infarction. No airspace disease. No atelectasis. No effusion. Is Musculoskeletal: No rib fracture. Review of the MIP images confirms the above findings. CT ABDOMEN and PELVIS FINDINGS Hepatobiliary: Peripheral regions of hypo attenuation/hypoperfusion in the LEFT and RIGHT hepatic lobes. These ill-defined regions are seen on axial image 20/6 for example. No enhancing lesion. No biliary duct dilatation. Gallbladder normal. Pancreas: Normal pancreas Spleen: Low-density lesion in the spleen measures 3.6 cm. This is unchanged from CT 2018. Adrenals/Urinary Tract: Adrenal glands normal. Kidneys enhance uniformly ureters and bladder normal. Stomach/Bowel: Stomach, small bowel appendix and cecum normal. The colon and rectum sigmoid colon normal.  Vascular/Lymphatic: Abdominal aorta is normal caliber. There is no retroperitoneal or periportal lymphadenopathy. No pelvic lymphadenopathy. Reproductive: Prostate normal. Other: No free fluid. Musculoskeletal: No aggressive osseous lesion. Review of the MIP images confirms the above findings. IMPRESSION: Chest Impression: 1. No evidence acute pulmonary embolism. 2. No acute pulmonary parenchymal findings. Abdomen / Pelvis Impression: 1. Hypoperfusion in the periphery of the LEFT and RIGHT hepatic lobe is nonspecific finding and may represent a benign vascular phenomena. Consider serologic correlation for hepatitis. Abdominal/hepatic  MRI could potentially help characterize hepatic abnormality 2. Hypoperfusion in the spleen unchanged from 2018 likely represents a hemangioma or cyst . Electronically Signed   By: Suzy Bouchard M.D.   On: 07/31/2019 19:04   Dg Chest Port 1 View  Result Date: 07/31/2019 CLINICAL DATA:  Weakness, sleepiness EXAM: PORTABLE CHEST 1 VIEW COMPARISON:  06/05/2019 FINDINGS: The heart size and mediastinal contours are within normal limits. Both lungs are clear. The visualized skeletal structures are unremarkable. IMPRESSION: No active disease. Electronically Signed   By: Kathreen Devoid   On: 07/31/2019 17:07     Subjective: Feeling better, sore throat better  Discharge Exam: Vitals:   08/04/19 0148 08/04/19 0530  BP: (!) 149/81 (!) 151/97  Pulse: 70 78  Resp: 18 18  Temp: 97.8 F (36.6 C) 98.1 F (36.7 C)  SpO2: 99% 98%     General: Pt is alert, awake, not in acute distress Cardiovascular: RRR, S1/S2 +, no rubs, no gallops Respiratory: CTA bilaterally, no wheezing, no rhonchi Abdominal: Soft, NT, ND, bowel sounds + Extremities: no edema, no cyanosis    The results of significant diagnostics from this hospitalization (including imaging, microbiology, ancillary and laboratory) are listed below for reference.     Microbiology: Recent Results (from the past 240 hour(s))  Novel Coronavirus, NAA ([facility])     Status: None   Collection Time: 07/29/19 12:40 PM   Specimen: Nasopharyngeal(NP) swabs in vial transport medium   NASOPHARYNGE  TESTING  Result Value Ref Range Status   SARS-CoV-2, NAA Not Detected Not Detected Final    Comment: This nucleic acid amplification test was developed and its performance characteristics determined by [employer]. Nucleic acid amplification tests include PCR and TMA. This test has not been FDA cleared or approved. This test has been authorized by FDA under an Emergency Use Authorization (EUA). This test is only authorized for the duration of  time the declaration that circumstances exist justifying the authorization of the emergency use of in vitro diagnostic tests for detection of SARS-CoV-2 virus and/or diagnosis of COVID-19 infection under section 564(b)(1) of the Act, 21 U.S.C. [id number]) (1), unless the authorization is terminated or revoked sooner. When diagnostic testing is negative, the possibility of a false negative result should be considered in the context of a patient's recent exposures and the presence of clinical signs and symptoms consistent with COVID-19. An individual without symptoms of COVID-19 and who is not shedding SARS-CoV-2 virus would  expect to have a negative (not detected) result in this assay.   Culture, blood (routine x 2)     Status: None (Preliminary result)   Collection Time: 07/31/19  4:10 PM   Specimen: BLOOD LEFT HAND  Result Value Ref Range Status   Specimen Description BLOOD LEFT HAND  Final   Special Requests   Final    BOTTLES DRAWN AEROBIC ONLY Blood Culture results may not be optimal due to an inadequate volume of blood received in culture bottles   Culture   Final  NO GROWTH 3 DAYS Performed at [facility], [place] [address], [place], [place] [address]    Report Status PENDING  Incomplete  Culture, blood (routine x 2)     Status: None (Preliminary result)   Collection Time: 07/31/19  4:38 PM   Specimen: BLOOD  Result Value Ref Range Status   Specimen Description BLOOD SITE NOT SPECIFIED  Final   Special Requests   Final    BOTTLES DRAWN AEROBIC AND ANAEROBIC Blood Culture adequate volume   Culture   Final    NO GROWTH 3 DAYS Performed at [facility], [address], [place], [place] [address]    Report Status PENDING  Incomplete  SARS Coronavirus 2 [facility] order, Performed in [facility] hospital lab) Nasopharyngeal Nasopharyngeal Swab     Status: None   Collection Time: 07/31/19  6:25 PM   Specimen: Nasopharyngeal Swab  Result Value Ref Range Status    SARS Coronavirus 2 NEGATIVE NEGATIVE Final    Comment: (NOTE) If result is NEGATIVE SARS-CoV-2 target nucleic acids are NOT DETECTED. The SARS-CoV-2 RNA is generally detectable in upper and lower  respiratory specimens during the acute phase of infection. The lowest  concentration of SARS-CoV-2 viral copies this assay can detect is 250  copies / mL. A negative result does not preclude SARS-CoV-2 infection  and should not be used as the sole basis for treatment or other  patient management decisions.  A negative result may occur with  improper specimen collection / handling, submission of specimen other  than nasopharyngeal swab, presence of viral mutation(s) within the  areas targeted by this assay, and inadequate number of viral copies  (<250 copies / mL). A negative result must be combined with clinical  observations, patient history, and epidemiological information. If result is POSITIVE SARS-CoV-2 target nucleic acids are DETECTED. The SARS-CoV-2 RNA is generally detectable in upper and lower  respiratory specimens dur ing the acute phase of infection.  Positive  results are indicative of active infection with SARS-CoV-2.  Clinical  correlation with patient history and other diagnostic information is  necessary to determine patient infection status.  Positive results do  not rule out bacterial infection or co-infection with other viruses. If result is PRESUMPTIVE POSTIVE SARS-CoV-2 nucleic acids MAY BE PRESENT.   A presumptive positive result was obtained on the submitted specimen  and confirmed on repeat testing.  While 2019 novel coronavirus  (SARS-CoV-2) nucleic acids may be present in the submitted sample  additional confirmatory testing may be necessary for epidemiological  and / or clinical management purposes  to differentiate between  SARS-CoV-2 and other Sarbecovirus currently known to infect humans.  If clinically indicated additional testing with an alternate test   methodology [phone]) is advised. The SARS-CoV-2 RNA is generally  detectable in upper and lower respiratory sp ecimens during the acute  phase of infection. The expected result is Negative. Fact Sheet for Patients:  StrictlyIdeas.no Fact Sheet for Healthcare Providers: BankingDealers.co.za This test is not yet approved or cleared by the [place] FDA and has been authorized for detection and/or diagnosis of SARS-CoV-2 by FDA under an Emergency Use Authorization (EUA).  This EUA will remain in effect (meaning this test can be used) for the duration of the COVID-19 declaration under Section 564(b)(1) of the Act, 21 U.S.C. section 360bbb-3(b)(1), unless the authorization is terminated or revoked sooner. Performed at [facility], [place] [address] [place], [place] [address]      Labs: BNP (  last 3 results) Recent Labs    06/05/19 1453 07/31/19 1746  BNP 24.1 [id number]   Basic Metabolic Panel: Recent Labs  Lab 08/02/19 2050 08/03/19 0051 08/03/19 0423 08/03/19 1854 08/04/19 0701  NA 133* 134* 136 131* 135  K 6.0* 5.0 4.8 4.6 4.4  CL 101 101 100 99 104  CO2 15* 21* 22 20* 19*  GLUCOSE 324* 190* 198* 299* 180*  BUN 19 18 17 17 14   CREATININE 0.94 0.91 0.88 0.93 0.72  CALCIUM 9.1 9.1 9.4 8.9 8.9   Liver Function Tests: Recent Labs  Lab 07/31/19 1744 08/03/19 0051  AST 22 33  ALT 23 23  ALKPHOS 61 58  BILITOT 0.6 0.6  PROT 6.8 6.8  ALBUMIN 3.4* 3.4*   Recent Labs  Lab 07/31/19 1744  LIPASE 29   No results for input(s): AMMONIA in the last 168 hours. CBC: Recent Labs  Lab 07/31/19 1608 07/31/19 1738 07/31/19 1744 08/01/19 0456 08/02/19 0601 08/03/19 0423  WBC 10.8*  --  8.6 14.3* 12.2* 11.3*  NEUTROABS  --   --  7.4  --   --   --   HGB 11.9* 12.2* 13.7 11.9* 12.3* 12.7*  HCT 36.5* 36.0* 41.8 36.1* 38.3* 37.3*  MCV 90.6  --  90.9 89.8 90.5 89.0  PLT 203  --  169 225 223 183   Cardiac Enzymes: No  results for input(s): CKTOTAL, CKMB, CKMBINDEX, TROPONINI in the last 168 hours. BNP: Invalid input(s): POCBNP CBG: Recent Labs  Lab 08/03/19 0653 08/03/19 1118 08/03/19 1603 08/03/19 2115 08/04/19 0632  GLUCAP 187* 245* 237* 178* 173*   D-Dimer No results for input(s): DDIMER in the last 72 hours. Hgb A1c No results for input(s): HGBA1C in the last 72 hours. Lipid Profile No results for input(s): CHOL, HDL, LDLCALC, TRIG, CHOLHDL, LDLDIRECT in the last 72 hours. Thyroid function studies No results for input(s): TSH, T4TOTAL, T3FREE, THYROIDAB in the last 72 hours.  Invalid input(s): FREET3 Anemia work up No results for input(s): VITAMINB12, FOLATE, FERRITIN, TIBC, IRON, RETICCTPCT in the last 72 hours. Urinalysis    Component Value Date/Time   COLORURINE YELLOW 07/31/2019 2035   APPEARANCEUR CLEAR 07/31/2019 2035   LABSPEC 1.017 07/31/2019 2035   PHURINE 5.0 07/31/2019 2035   GLUCOSEU >=500 (A) 07/31/2019 2035   HGBUR NEGATIVE 07/31/2019 2035   BILIRUBINUR NEGATIVE 07/31/2019 2035   KETONESUR NEGATIVE 07/31/2019 2035   PROTEINUR NEGATIVE 07/31/2019 2035   UROBILINOGEN 0.2 05/27/2014 2312   NITRITE NEGATIVE 07/31/2019 2035   LEUKOCYTESUR NEGATIVE 07/31/2019 2035   Sepsis Labs Invalid input(s): PROCALCITONIN,  WBC,  LACTICIDVEN Microbiology Recent Results (from the past 240 hour(s))  Novel Coronavirus, NAA ([facility])     Status: None   Collection Time: 07/29/19 12:40 PM   Specimen: Nasopharyngeal(NP) swabs in vial transport medium   NASOPHARYNGE  TESTING  Result Value Ref Range Status   SARS-CoV-2, NAA Not Detected Not Detected Final    Comment: This nucleic acid amplification test was developed and its performance characteristics determined by [employer]. Nucleic acid amplification tests include PCR and TMA. This test has not been FDA cleared or approved. This test has been authorized by FDA under an Emergency Use Authorization (EUA). This test is only  authorized for the duration of time the declaration that circumstances exist justifying the authorization of the emergency use of in vitro diagnostic tests for detection of SARS-CoV-2 virus and/or diagnosis of COVID-19 infection under section 564(b)(1) of the Act, 21 U.S.C. [id number]) (1), unless  the authorization is terminated or revoked sooner. When diagnostic testing is negative, the possibility of a false negative result should be considered in the context of a patient's recent exposures and the presence of clinical signs and symptoms consistent with COVID-19. An individual without symptoms of COVID-19 and who is not shedding SARS-CoV-2 virus would  expect to have a negative (not detected) result in this assay.   Culture, blood (routine x 2)     Status: None (Preliminary result)   Collection Time: 07/31/19  4:10 PM   Specimen: BLOOD LEFT HAND  Result Value Ref Range Status   Specimen Description BLOOD LEFT HAND  Final   Special Requests   Final    BOTTLES DRAWN AEROBIC ONLY Blood Culture results may not be optimal due to an inadequate volume of blood received in culture bottles   Culture   Final    NO GROWTH 3 DAYS Performed at [facility], [place] [address] [place], [place] [address]    Report Status PENDING  Incomplete  Culture, blood (routine x 2)     Status: None (Preliminary result)   Collection Time: 07/31/19  4:38 PM   Specimen: BLOOD  Result Value Ref Range Status   Specimen Description BLOOD SITE NOT SPECIFIED  Final   Special Requests   Final    BOTTLES DRAWN AEROBIC AND ANAEROBIC Blood Culture adequate volume   Culture   Final    NO GROWTH 3 DAYS Performed at [facility], [address] [place], [place] [address]    Report Status PENDING  Incomplete  SARS Coronavirus 2 [facility] order, Performed in [facility] hospital lab) Nasopharyngeal Nasopharyngeal Swab     Status: None   Collection Time: 07/31/19  6:25 PM   Specimen: Nasopharyngeal Swab   Result Value Ref Range Status   SARS Coronavirus 2 NEGATIVE NEGATIVE Final    Comment: (NOTE) If result is NEGATIVE SARS-CoV-2 target nucleic acids are NOT DETECTED. The SARS-CoV-2 RNA is generally detectable in upper and lower  respiratory specimens during the acute phase of infection. The lowest  concentration of SARS-CoV-2 viral copies this assay can detect is 250  copies / mL. A negative result does not preclude SARS-CoV-2 infection  and should not be used as the sole basis for treatment or other  patient management decisions.  A negative result may occur with  improper specimen collection / handling, submission of specimen other  than nasopharyngeal swab, presence of viral mutation(s) within the  areas targeted by this assay, and inadequate number of viral copies  (<250 copies / mL). A negative result must be combined with clinical  observations, patient history, and epidemiological information. If result is POSITIVE SARS-CoV-2 target nucleic acids are DETECTED. The SARS-CoV-2 RNA is generally detectable in upper and lower  respiratory specimens dur ing the acute phase of infection.  Positive  results are indicative of active infection with SARS-CoV-2.  Clinical  correlation with patient history and other diagnostic information is  necessary to determine patient infection status.  Positive results do  not rule out bacterial infection or co-infection with other viruses. If result is PRESUMPTIVE POSTIVE SARS-CoV-2 nucleic acids MAY BE PRESENT.   A presumptive positive result was obtained on the submitted specimen  and confirmed on repeat testing.  While 2019 novel coronavirus  (SARS-CoV-2) nucleic acids may be present in the submitted sample  additional confirmatory testing may be necessary for epidemiological  and / or clinical management purposes  to differentiate between  SARS-CoV-2 and other Sarbecovirus currently known to infect humans.  If clinically indicated additional  testing with an alternate test  methodology [phone]) is advised. The SARS-CoV-2 RNA is generally  detectable in upper and lower respiratory sp ecimens during the acute  phase of infection. The expected result is Negative. Fact Sheet for Patients:  StrictlyIdeas.no Fact Sheet for Healthcare Providers: BankingDealers.co.za This test is not yet approved or cleared by the [place] FDA and has been authorized for detection and/or diagnosis of SARS-CoV-2 by FDA under an Emergency Use Authorization (EUA).  This EUA will remain in effect (meaning this test can be used) for the duration of the COVID-19 declaration under Section 564(b)(1) of the Act, 21 U.S.C. section 360bbb-3(b)(1), unless the authorization is terminated or revoked sooner. Performed at [facility], [place] [address] [place], [place] [address]      Time coordinating discharge: 40 minutes  SIGNED:   Elmarie Shiley, MD  [facility]

## 2019-08-05 LAB — CULTURE, BLOOD (ROUTINE X 2)
Culture: NO GROWTH
Culture: NO GROWTH
Special Requests: ADEQUATE

## 2019-08-22 ENCOUNTER — Observation Stay (HOSPITAL_COMMUNITY)
Admission: EM | Admit: 2019-08-22 | Discharge: 2019-08-23 | Disposition: A | Discharge status: Home / Self Care | Payer: Medicaid Other | Attending: Family Medicine | Admitting: Family Medicine

## 2019-08-22 ENCOUNTER — Encounter (HOSPITAL_COMMUNITY): Payer: Self-pay

## 2019-08-22 ENCOUNTER — Other Ambulatory Visit: Payer: Self-pay

## 2019-08-22 ENCOUNTER — Emergency Department (HOSPITAL_COMMUNITY): Payer: Medicaid Other

## 2019-08-22 DIAGNOSIS — E785 Hyperlipidemia, unspecified: Secondary | ICD-10-CM | POA: Diagnosis not present

## 2019-08-22 DIAGNOSIS — G4733 Obstructive sleep apnea (adult) (pediatric): Secondary | ICD-10-CM | POA: Diagnosis not present

## 2019-08-22 DIAGNOSIS — I77810 Thoracic aortic ectasia: Secondary | ICD-10-CM | POA: Diagnosis not present

## 2019-08-22 DIAGNOSIS — Z20828 Contact with and (suspected) exposure to other viral communicable diseases: Secondary | ICD-10-CM | POA: Insufficient documentation

## 2019-08-22 DIAGNOSIS — K219 Gastro-esophageal reflux disease without esophagitis: Secondary | ICD-10-CM | POA: Diagnosis not present

## 2019-08-22 DIAGNOSIS — D649 Anemia, unspecified: Secondary | ICD-10-CM | POA: Diagnosis not present

## 2019-08-22 DIAGNOSIS — K760 Fatty (change of) liver, not elsewhere classified: Secondary | ICD-10-CM | POA: Diagnosis not present

## 2019-08-22 DIAGNOSIS — I110 Hypertensive heart disease with heart failure: Secondary | ICD-10-CM | POA: Diagnosis not present

## 2019-08-22 DIAGNOSIS — K589 Irritable bowel syndrome without diarrhea: Secondary | ICD-10-CM | POA: Insufficient documentation

## 2019-08-22 DIAGNOSIS — I5033 Acute on chronic diastolic (congestive) heart failure: Secondary | ICD-10-CM | POA: Diagnosis not present

## 2019-08-22 DIAGNOSIS — Z79899 Other long term (current) drug therapy: Secondary | ICD-10-CM | POA: Insufficient documentation

## 2019-08-22 DIAGNOSIS — Z6841 Body Mass Index (BMI) 40.0 and over, adult: Secondary | ICD-10-CM | POA: Diagnosis not present

## 2019-08-22 DIAGNOSIS — Z86711 Personal history of pulmonary embolism: Secondary | ICD-10-CM | POA: Diagnosis not present

## 2019-08-22 DIAGNOSIS — Z8711 Personal history of peptic ulcer disease: Secondary | ICD-10-CM | POA: Insufficient documentation

## 2019-08-22 DIAGNOSIS — Z955 Presence of coronary angioplasty implant and graft: Secondary | ICD-10-CM | POA: Insufficient documentation

## 2019-08-22 DIAGNOSIS — E1165 Type 2 diabetes mellitus with hyperglycemia: Secondary | ICD-10-CM | POA: Diagnosis present

## 2019-08-22 DIAGNOSIS — Z794 Long term (current) use of insulin: Secondary | ICD-10-CM | POA: Insufficient documentation

## 2019-08-22 DIAGNOSIS — E1110 Type 2 diabetes mellitus with ketoacidosis without coma: Secondary | ICD-10-CM | POA: Insufficient documentation

## 2019-08-22 DIAGNOSIS — Z86718 Personal history of other venous thrombosis and embolism: Secondary | ICD-10-CM | POA: Diagnosis not present

## 2019-08-22 DIAGNOSIS — F329 Major depressive disorder, single episode, unspecified: Secondary | ICD-10-CM | POA: Insufficient documentation

## 2019-08-22 DIAGNOSIS — E872 Acidosis, unspecified: Secondary | ICD-10-CM | POA: Diagnosis present

## 2019-08-22 DIAGNOSIS — Z833 Family history of diabetes mellitus: Secondary | ICD-10-CM | POA: Insufficient documentation

## 2019-08-22 DIAGNOSIS — Z8673 Personal history of transient ischemic attack (TIA), and cerebral infarction without residual deficits: Secondary | ICD-10-CM | POA: Insufficient documentation

## 2019-08-22 DIAGNOSIS — G8929 Other chronic pain: Secondary | ICD-10-CM | POA: Diagnosis present

## 2019-08-22 DIAGNOSIS — M549 Dorsalgia, unspecified: Secondary | ICD-10-CM | POA: Insufficient documentation

## 2019-08-22 DIAGNOSIS — R600 Localized edema: Secondary | ICD-10-CM

## 2019-08-22 DIAGNOSIS — I2510 Atherosclerotic heart disease of native coronary artery without angina pectoris: Secondary | ICD-10-CM | POA: Diagnosis present

## 2019-08-22 DIAGNOSIS — Z8249 Family history of ischemic heart disease and other diseases of the circulatory system: Secondary | ICD-10-CM | POA: Insufficient documentation

## 2019-08-22 DIAGNOSIS — Z7982 Long term (current) use of aspirin: Secondary | ICD-10-CM | POA: Insufficient documentation

## 2019-08-22 DIAGNOSIS — M1990 Unspecified osteoarthritis, unspecified site: Secondary | ICD-10-CM | POA: Insufficient documentation

## 2019-08-22 DIAGNOSIS — I5032 Chronic diastolic (congestive) heart failure: Secondary | ICD-10-CM | POA: Diagnosis present

## 2019-08-22 DIAGNOSIS — E1142 Type 2 diabetes mellitus with diabetic polyneuropathy: Secondary | ICD-10-CM | POA: Insufficient documentation

## 2019-08-22 DIAGNOSIS — Z886 Allergy status to analgesic agent status: Secondary | ICD-10-CM | POA: Insufficient documentation

## 2019-08-22 LAB — BASIC METABOLIC PANEL
Anion gap: 19 — ABNORMAL HIGH (ref 5–15)
BUN: 12 mg/dL (ref 6–20)
CO2: 19 mmol/L — ABNORMAL LOW (ref 22–32)
Calcium: 8.8 mg/dL — ABNORMAL LOW (ref 8.9–10.3)
Chloride: 98 mmol/L (ref 98–111)
Creatinine, Ser: 1.04 mg/dL (ref 0.61–1.24)
GFR calc Af Amer: >60 mL/min (ref >60)
GFR calc non Af Amer: >60 mL/min (ref >60)
Glucose, Bld: 399 mg/dL — ABNORMAL HIGH (ref 70–99)
Potassium: 4 mmol/L (ref 3.5–5.1)
Sodium: 136 mmol/L (ref 135–145)

## 2019-08-22 LAB — CBG MONITORING, ED: Glucose-Capillary: 209 mg/dL — ABNORMAL HIGH (ref 70–99)

## 2019-08-22 LAB — CBC
HCT: 32.3 % — ABNORMAL LOW (ref 39.0–52.0)
Hemoglobin: 10.3 g/dL — ABNORMAL LOW (ref 13.0–17.0)
MCH: 29.4 pg (ref 26.0–34.0)
MCHC: 31.9 g/dL (ref 30.0–36.0)
MCV: 92.3 fL (ref 80.0–100.0)
Platelets: 245 10*3/uL (ref 150–400)
RBC: 3.5 MIL/uL — ABNORMAL LOW (ref 4.22–5.81)
RDW: 15.4 % (ref 11.5–15.5)
WBC: 7 10*3/uL (ref 4.0–10.5)
nRBC: 0 % (ref 0.0–0.2)

## 2019-08-22 MED ORDER — SODIUM CHLORIDE 0.9% FLUSH
3.0000 mL | Freq: Once | INTRAVENOUS | Status: AC
  Administered 2019-08-22: 3 mL via INTRAVENOUS

## 2019-08-22 NOTE — ED Notes (Signed)
Patient roommate Lazarus Salines [phone]  She is calling would like an update on patient.

## 2019-08-22 NOTE — ED Triage Notes (Signed)
Pt reports bilateral arm swelling since last night, and SOB since this morning. Hx of CHF. resp e.u at this time

## 2019-08-22 NOTE — ED Provider Notes (Addendum)
[facility] EMERGENCY DEPARTMENT Provider Note   CSN: [id number] Arrival date & time: 08/22/19  1552     History   Chief Complaint Chief Complaint  Patient presents with  . Shortness of Breath  . Arm Swelling    HPI [name] is a 58 y.o. male with a history of diabetes mellitus type 2, CAD s/p DES to LAD 123XX123, chronic diastolic CHF, DVT, PE, OSA, pancreatitis, thoracic aortic ectasia, CVA, PUD, HTN, and HLD who presents to the emergency department who presents to the emergency department with a chief complaint of shortness of breath.  The patient endorses shortness of breath, onset today.  Shortness of breath is worse with exertion, but he is also feeling winded at rest.  He reports associated swelling to his bilateral arms and legs.  No chest pain, dizziness, lightheadedness, or palpitations.  He denies recent fever, cough, nausea, vomiting, diarrhea, and abdominal pain.  He reports that his Lasix dosing was adjusted from 3-2 during his last hospitalization.  His last dose was this morning.  Although he is oriented to person, place, and time, the patient seems somnolent and confused.  Initially says, that he has had a 15 pound weight gain since his last hospitalization, but later says that he has had a 15 pound weight loss.  He seems to have difficulty remembering if he took his morning medications.     The history is provided by the patient. No language interpreter was used.    Past Medical History:  Diagnosis Date  . Anemia   . Arthritis   . Back pain   . CAD (coronary artery disease)    a. s/p DES to LAD in 05/2016  . Cervical radiculopathy   . Chronic diastolic CHF (congestive heart failure) ([place])   . Chronic pain   . Depression   . DVT (deep venous thrombosis) ([place])   . Hematemesis   . Hepatic steatosis   . Hyperlipidemia   . Hypertension   . IBS (irritable bowel syndrome)   . Morbid obesity ([place])   . OSA (obstructive sleep apnea)    . Pancreatitis   . PE (pulmonary thromboembolism) ([place])   . Peripheral neuropathy   . PUD (peptic ulcer disease)   . Renal disorder   . Stroke [facility])    a. ?details unclear - not seen on imaging when he was admitted in 05/2017 for TIA symptoms which were felt due to cervical radiculopathy.  . Thoracic aortic ectasia (H[facility])    a. 4.3cm ectatic ascending thoracic aorta by CT 06/2017.   . Type 2 diabetes mellitus [facility])     Patient Active Problem List   Diagnosis Date Noted  . Acute on chronic diastolic CHF (congestive heart failure) ([place]) 08/23/2019  . ARF (acute renal failure) ([place]) 08/01/2019  . Hyperkalemia 08/01/2019  . Uncontrolled type 2 diabetes mellitus with hyperglycemia ([place]) 08/01/2019  . AKI (acute kidney injury) ([place])   . Hypotension 07/31/2019  . DKA (diabetic ketoacidoses) ([place]) 05/05/2019  . Blurred vision 05/05/2019  . Alteration consciousness 11/19/2018  . Neck pain 10/09/2018  . Left arm weakness 10/09/2018  . B12 deficiency 08/10/2017  . Persistent headaches 08/09/2017  . GERD (gastroesophageal reflux disease) 06/29/2017  . Left arm numbness   . Cerebral embolism with cerebral infarction 06/17/2017  . TIA (transient ischemic attack) 06/17/2017  . Chronic back pain 12/29/2016  . Depression 12/15/2016  . Vitamin D deficiency 12/09/2016  . Right hip pain 12/07/2016  . Hypokalemia 12/07/2016  .  Type 2 diabetes mellitus with vascular disease ([place]) 05/31/2016  . Normocytic normochromic anemia 05/31/2016  . Chest pain 05/31/2016  . CAD (coronary artery disease) 01/06/2016  . DVT (deep venous thrombosis) ([place]) 01/06/2016  . Lactic acidosis 05/28/2014  . Nonspecific chest pain 01/29/2014  . Uncontrolled secondary diabetes with peripheral neuropathy ([place]) 01/29/2014  . Obesity, Class III, BMI 40-49.9 (morbid obesity) ([place]) 01/29/2014  . Snoring 01/29/2014  . Dyslipidemia 01/29/2014  . HTN (hypertension) 01/29/2014  . Abnormal nuclear stress test 01/29/2014     Past Surgical History:  Procedure Laterality Date  . CARDIAC CATHETERIZATION N/A 05/31/2016   Procedure: Left Heart Cath and Coronary Angiography;  Surgeon: Peter M Martinique, MD;  Location: [place] [facility] CV LAB;  Service: Cardiovascular;  Laterality: N/A;  . CARDIAC CATHETERIZATION N/A 05/31/2016   Procedure: Intravascular Pressure Wire/FFR Study;  Surgeon: Peter M Martinique, MD;  Location: [facility] CV LAB;  Service: Cardiovascular;  Laterality: N/A;  . CARDIAC CATHETERIZATION N/A 05/31/2016   Procedure: Coronary Stent Intervention;  Surgeon: Peter M Martinique, MD;  Location: [facility] CV LAB;  Service: Cardiovascular;  Laterality: N/A;  . LEFT HEART CATH AND CORONARY ANGIOGRAPHY N/A 12/08/2017   Procedure: LEFT HEART CATH AND CORONARY ANGIOGRAPHY;  Surgeon: Leonie Man, MD;  Location: [place] CV LAB;  Service: Cardiovascular;  Laterality: N/A;  . LEFT HEART CATHETERIZATION WITH CORONARY ANGIOGRAM N/A 02/03/2014   Procedure: LEFT HEART CATHETERIZATION WITH CORONARY ANGIOGRAM;  Surgeon: Pixie Casino, MD;  [facility] C[facility];  Service: Cardiovascular;  Laterality: N/A;  . left leg stent           Home Medications    Prior to Admission medications   Medication Sig Start Date End Date Taking? Authorizing Provider  Accu-Chek FastClix Lancets MISC 1 Device by Other route 2 (two) times daily.  03/20/19  Yes [provider]  Albuterol Sulfate (PROAIR RESPICLICK) 123XX123 (90 Base) MCG/ACT AEPB Inhale 2 puffs into the lungs every 6 (six) hours as needed. Patient taking differently: Inhale 2 puffs into the lungs every 6 (six) hours as needed (for breathing).  11/06/18  Yes Robyn Haber, MD  aspirin 81 MG chewable tablet Chew 1 tablet (81 mg total) by mouth daily. 06/01/16  Yes Elwin Mocha, MD  atorvastatin (LIPITOR) 80 MG tablet Take 1 tablet (80 mg total) by mouth daily. 01/26/17  Yes Strader, [place] M, PA-C  carvedilol (COREG) 25 MG tablet Take 1 tablet (25 mg total) by mouth 2 (two)  times daily with a meal. 01/26/17  Yes Strader, [place] M, PA-C  DULoxetine (CYMBALTA) 60 MG capsule TAKE 1 CAPSULE BY MOUTH EVERY DAY Patient taking differently: Take 60 mg by mouth daily.  12/24/18  Yes Marcial Pacas, MD  glipiZIDE (GLUCOTROL) 10 MG tablet Take 10 mg by mouth 2 (two) times daily. 05/30/17  Yes [provider]  Insulin Detemir (LEVEMIR FLEXTOUCH) 100 UNIT/ML Pen Inject 60 Units into the skin daily. 05/06/19  Yes Black, Lezlie Octave, NP  Insulin Pen Needle 31G X 5 MM MISC Use 1 needle daily to inject insulin as prescribed 06/18/17  Yes Barton Dubois, MD  isosorbide mononitrate (IMDUR) 60 MG 24 hr tablet Take 1 tablet (60 mg total) by mouth daily. 02/06/19  Yes Skeet Latch, MD  nitroGLYCERIN (NITROSTAT) 0.4 MG SL tablet Place 1 tablet (0.4 mg total) under the tongue every 5 (five) minutes as needed for chest pain. 02/06/19  Yes Skeet Latch, MD  NOVOLOG FLEXPEN 100 UNIT/ML FlexPen Inject 16-22 Units  into the skin 3 (three) times daily with meals. Per sliding scale 05/13/19  Yes [provider]  nystatin (MYCOSTATIN) 100000 UNIT/ML suspension Take 5 mLs (500,000 Units total) by mouth 4 (four) times daily. 08/04/19  Yes Regalado, Belkys A, MD  oxyCODONE (ROXICODONE) 15 MG immediate release tablet Take 15 mg 3 (three) times daily as needed by mouth for pain.  08/28/17  Yes [provider]  pantoprazole (PROTONIX) 40 MG tablet Take 40 mg by mouth daily. 08/12/19  Yes [provider]  pregabalin (LYRICA) 300 MG capsule Take 300 mg by mouth 2 (two) times daily. 05/18/19  Yes [provider]  sertraline (ZOLOFT) 50 MG tablet Take 50 mg by mouth daily.   Yes [provider]  tiZANidine (ZANAFLEX) 2 MG tablet Take 2 mg by mouth 2 (two) times daily as needed for pain.   Yes [provider]  traZODone (DESYREL) 50 MG tablet Take 1 tablet (50 mg total) by mouth at bedtime. 12/15/16  Yes Burns, Arloa Koh, MD  Vitamin D, Ergocalciferol, (DRISDOL)  1.25 MG (50000 UT) CAPS capsule Take 50,000 Units by mouth once a week. Wednesdays 08/15/19  Yes [provider]  XTAMPZA ER 27 MG C12A Take 27 mg by mouth 2 (two) times daily. 08/15/19  Yes [provider]  diclofenac sodium (VOLTAREN) 1 % GEL Apply 1 application topically 4 (four) times daily. Patient not taking: Reported on 08/22/2019 04/05/18   Melynda Ripple, MD  furosemide (LASIX) 40 MG tablet Take 1 tablet (40 mg total) by mouth 3 (three) times daily. 08/23/19 08/22/20  Darliss Cheney, MD    Family History Family History  Problem Relation Age of Onset  . Cancer Father   . Hypertension Mother   . Diabetes Mother   . Breast cancer Mother   . Hypertension Brother   . Diabetes Brother   . Hypertension Sister   . Diabetes Sister     Social History Social History   Tobacco Use  . Smoking status: Never Smoker  . Smokeless tobacco: Never Used  Substance Use Topics  . Alcohol use: No  . Drug use: No     Allergies   Coconut flavor [flavoring agent], Coconut oil, Ibuprofen, Aleve [naproxen], and Nsaids   Review of Systems Review of Systems  Constitutional: Negative for appetite change, chills and fever.  HENT: Negative for congestion, rhinorrhea and sore throat.   Respiratory: Positive for shortness of breath. Negative for wheezing.   Cardiovascular: Positive for leg swelling. Negative for chest pain and palpitations.  Gastrointestinal: Negative for abdominal pain, diarrhea, nausea and vomiting.  Genitourinary: Negative for dysuria.  Musculoskeletal: Negative for back pain, myalgias and neck pain.  Skin: Negative for rash.  Allergic/Immunologic: Negative for immunocompromised state.  Neurological: Negative for seizures, syncope, weakness, numbness and headaches.  Psychiatric/Behavioral: Positive for confusion. Negative for self-injury. The patient is not hyperactive.      Physical Exam Updated Vital Signs BP 101/68 (BP Location: Right Arm)   Pulse  77   Temp 97.7 F (36.5 C) (Oral)   Resp 20   Ht 5\' 10"  (1.778 m)   Wt (!) 147 kg   SpO2 95%   BMI 46.50 kg/m   Physical Exam Vitals signs and nursing note reviewed.  Constitutional:      Appearance: He is well-developed.     Comments: Obese  HENT:     Head: Normocephalic.  Eyes:     Conjunctiva/sclera: Conjunctivae normal.  Neck:     Musculoskeletal: Neck  supple.  Cardiovascular:     Rate and Rhythm: Normal rate and regular rhythm.     Pulses: Normal pulses.     Heart sounds: Normal heart sounds. No murmur. No friction rub. No gallop.   Pulmonary:     Effort: Pulmonary effort is normal.     Comments: Lungs are clear to auscultation bilaterally without accessory muscle use or respiratory distress. Abdominal:     General: There is no distension.     Palpations: Abdomen is soft.     Comments: Obese, but nontender  Musculoskeletal:     Comments: Pitting edema noted to the bilateral lower extremities.  Skin:    General: Skin is warm and dry.  Neurological:     Mental Status: He is alert.     Comments: Oriented to person, time, and place.  He has intermittent confusion when answering questions regarding information over the last few days, including if he took his morning medications.   Psychiatric:        Behavior: Behavior normal.      ED Treatments / Results  Labs (all labs ordered are listed, but only abnormal results are displayed) Labs Reviewed  BASIC METABOLIC PANEL - Abnormal; Notable for the following components:      Result Value   CO2 19 (*)    Glucose, Bld 399 (*)    Calcium 8.8 (*)    Anion gap 19 (*)    All other components within normal limits  CBC - Abnormal; Notable for the following components:   RBC 3.50 (*)    Hemoglobin 10.3 (*)    HCT 32.3 (*)    All other components within normal limits  LACTIC ACID, PLASMA - Abnormal; Notable for the following components:   Lactic Acid, Venous 4.4 (*)    All other components within normal limits   COMPREHENSIVE METABOLIC PANEL - Abnormal; Notable for the following components:   Potassium 3.4 (*)    CO2 21 (*)    Glucose, Bld 212 (*)    Anion gap 16 (*)    All other components within normal limits  LACTIC ACID, PLASMA - Abnormal; Notable for the following components:   Lactic Acid, Venous 3.4 (*)    All other components within normal limits  LACTIC ACID, PLASMA - Abnormal; Notable for the following components:   Lactic Acid, Venous 2.9 (*)    All other components within normal limits  GLUCOSE, CAPILLARY - Abnormal; Notable for the following components:   Glucose-Capillary 220 (*)    All other components within normal limits  CBC WITH DIFFERENTIAL/PLATELET - Abnormal; Notable for the following components:   RBC 3.57 (*)    Hemoglobin 10.3 (*)    HCT 32.3 (*)    All other components within normal limits  MAGNESIUM - Abnormal; Notable for the following components:   Magnesium 1.5 (*)    All other components within normal limits  LACTIC ACID, PLASMA - Abnormal; Notable for the following components:   Lactic Acid, Venous 3.3 (*)    All other components within normal limits  GLUCOSE, CAPILLARY - Abnormal; Notable for the following components:   Glucose-Capillary 315 (*)    All other components within normal limits  CBG MONITORING, ED - Abnormal; Notable for the following components:   Glucose-Capillary 209 (*)    All other components within normal limits  POCT I-STAT 7, (LYTES, BLD GAS, ICA,H+H) - Abnormal; Notable for the following components:   pO2, Arterial 78.0 (*)    Potassium  3.2 (*)    HCT 30.0 (*)    Hemoglobin 10.2 (*)    All other components within normal limits  CBG MONITORING, ED - Abnormal; Notable for the following components:   Glucose-Capillary 205 (*)    All other components within normal limits  SARS CORONAVIRUS 2 (TAT 6-24 HRS)  BRAIN NATRIURETIC PEPTIDE  I-STAT ARTERIAL BLOOD GAS, ED    EKG EKG Interpretation  Date/Time:  Thursday August 22 2019  16:02:26 EDT Ventricular Rate:  88 PR Interval:  160 QRS Duration: 80 QT Interval:  382 QTC Calculation: 462 R Axis:   72 Text Interpretation: Normal sinus rhythm Normal ECG No significant change since last tracing Confirmed by Addison Lank [phone]) on 08/22/2019 11:04:30 PM   Radiology Dg Chest 2 View  Result Date: 08/22/2019 CLINICAL DATA:  Short of breath EXAM: CHEST - 2 VIEW COMPARISON:  07/31/2019 FINDINGS: The heart size and mediastinal contours are within normal limits. Both lungs are clear. Degenerative changes of the spine. IMPRESSION: No active cardiopulmonary disease. Electronically Signed   By: Donavan Foil M.D.   On: 08/22/2019 16:53    Procedures .Critical Care Performed by: Joanne Gavel, PA-C Authorized by: Joanne Gavel, PA-C   Critical care provider statement:    Critical care time (minutes):  35   Critical care time was exclusive of:  Separately billable procedures and treating other patients and teaching time   Critical care was necessary to treat or prevent imminent or life-threatening deterioration of the following conditions:  Metabolic crisis   Critical care was time spent personally by me on the following activities:  Ordering and performing treatments and interventions, ordering and review of laboratory studies, ordering and review of radiographic studies, pulse oximetry, re-evaluation of patient's condition, review of old charts, obtaining history from patient or surrogate, examination of patient, evaluation of patient's response to treatment and development of treatment plan with patient or surrogate   I assumed direction of critical care for this patient from another provider in my specialty: no     (including critical care time)  Medications Ordered in ED Medications  sodium chloride flush (NS) 0.9 % injection 3 mL (3 mLs Intravenous Given 08/22/19 2338)  sodium chloride 0.9 % bolus 4,000 mL (0 mLs Intravenous Stopped 08/23/19 0217)  potassium  chloride SA (KLOR-CON) CR tablet 40 mEq (40 mEq Oral Given 08/23/19 0256)     Initial Impression / Assessment and Plan / ED Course  I have reviewed the triage vital signs and the nursing notes.  Pertinent labs & imaging results that were available during my care of the patient were reviewed by me and considered in my medical decision making (see chart for details).        58 year old male with a history of diabetes mellitus type 2, CAD s/p DES to LAD 123XX123, chronic diastolic CHF, DVT, PE, OSA, pancreatitis, thoracic aortic ectasia, CVA, PUD, HTN, and HLD who presents with shortness of breath, onset today and swelling to his arms and legs.  Reports his Lasix dosing was adjusted during his last hospitalization and his last dose was this morning.  On exam, he is mildly hypertensive, but has no hypoxia or tachypnea.  Although he is alert and oriented x3, he seems somewhat confused when asking questions about his life or events over the last few days, including whether or not he took his morning medications for diabetes.  .  Initially, glucose with is 399 with a bicarb of 19 and anion gap of  19.  However, the patient waited in the waiting room of the ER for several hours.  By the time that his glucose was rechecked it was 209.  Repeat metabolic panel was ordered and anion gap has improved to 16 with bicarb improving to 21 despite not having treatment.   BNP is 58.  Chest x-ray is unremarkable.  Arterial blood gas with pH of 7.401.  He does note that his arms and legs have been swelling.  He has not missed any doses of his home Lasix.  His weight during his last admission was noted to be 141.3 kg, but today was 140.6 kg in the ER.  Although he does have a history of congestive heart failure, echo from earlier this year demonstrated an EF of 60 to 65%.  Lactate was also found to be 4.4.  He does appear to have chronically elevated lactate, but this is concerning in the setting of Metformin use with  poor glycemic control. Will give IV fluids since his weight is down from previous and EF is preserved. The patient was seen and independently evaluated by Dr. Leonette Monarch, attending physician, who is in agreement with work-up and plan.  Hemoglobin is also noted to be 10.3, down from 12.7 earlier this month.  Denies bleeding at this time.  Consult to the hospitalist team and Dr. Myna Hidalgo will admit for lactic acidosis. The patient appears reasonably stabilized for admission considering the current resources, flow, and capabilities available in the ED at this time, and I doubt any other [facility] requiring further screening and/or treatment in the ED prior to admission.  Final Clinical Impressions(s) / ED Diagnoses   Final diagnoses:  Lactic acidosis    ED Discharge Orders         Ordered    furosemide (LASIX) 40 MG tablet  3 times daily     08/23/19 1201    Discharge patient     08/23/19 1201           Joanne Gavel, PA-C 08/23/19 [id number]    Fatima Blank, MD 08/24/19 0806    Joanne Gavel, PA-C 08/24/19 [id number]    Fatima Blank, MD 08/28/19 [phone]

## 2019-08-23 ENCOUNTER — Encounter (HOSPITAL_COMMUNITY): Payer: Self-pay | Admitting: Family Medicine

## 2019-08-23 DIAGNOSIS — M545 Low back pain: Secondary | ICD-10-CM

## 2019-08-23 DIAGNOSIS — D649 Anemia, unspecified: Secondary | ICD-10-CM

## 2019-08-23 DIAGNOSIS — E872 Acidosis: Secondary | ICD-10-CM | POA: Diagnosis not present

## 2019-08-23 DIAGNOSIS — I2510 Atherosclerotic heart disease of native coronary artery without angina pectoris: Secondary | ICD-10-CM

## 2019-08-23 DIAGNOSIS — I5033 Acute on chronic diastolic (congestive) heart failure: Secondary | ICD-10-CM

## 2019-08-23 DIAGNOSIS — E1165 Type 2 diabetes mellitus with hyperglycemia: Secondary | ICD-10-CM

## 2019-08-23 DIAGNOSIS — G8929 Other chronic pain: Secondary | ICD-10-CM

## 2019-08-23 DIAGNOSIS — I5032 Chronic diastolic (congestive) heart failure: Secondary | ICD-10-CM | POA: Diagnosis present

## 2019-08-23 LAB — CBG MONITORING, ED: Glucose-Capillary: 205 mg/dL — ABNORMAL HIGH (ref 70–99)

## 2019-08-23 LAB — POCT I-STAT 7, (LYTES, BLD GAS, ICA,H+H)
Bicarbonate: 24.5 mmol/L (ref 20.0–28.0)
Calcium, Ion: 1.16 mmol/L (ref 1.15–1.40)
HCT: 30 % — ABNORMAL LOW (ref 39.0–52.0)
Hemoglobin: 10.2 g/dL — ABNORMAL LOW (ref 13.0–17.0)
O2 Saturation: 95 %
Patient temperature: 98.5
Potassium: 3.2 mmol/L — ABNORMAL LOW (ref 3.5–5.1)
Sodium: 139 mmol/L (ref 135–145)
TCO2: 26 mmol/L (ref 22–32)
pCO2 arterial: 39.5 mmHg (ref 32.0–48.0)
pH, Arterial: 7.401 (ref 7.350–7.450)
pO2, Arterial: 78 mmHg — ABNORMAL LOW (ref 83.0–108.0)

## 2019-08-23 LAB — CBC WITH DIFFERENTIAL/PLATELET
Abs Immature Granulocytes: 0.03 10*3/uL (ref 0.00–0.07)
Basophils Absolute: 0 10*3/uL (ref 0.0–0.1)
Basophils Relative: 0 %
Eosinophils Absolute: 0.1 10*3/uL (ref 0.0–0.5)
Eosinophils Relative: 1 %
HCT: 32.3 % — ABNORMAL LOW (ref 39.0–52.0)
Hemoglobin: 10.3 g/dL — ABNORMAL LOW (ref 13.0–17.0)
Immature Granulocytes: 0 %
Lymphocytes Relative: 30 %
Lymphs Abs: 2.3 10*3/uL (ref 0.7–4.0)
MCH: 28.9 pg (ref 26.0–34.0)
MCHC: 31.9 g/dL (ref 30.0–36.0)
MCV: 90.5 fL (ref 80.0–100.0)
Monocytes Absolute: 0.7 10*3/uL (ref 0.1–1.0)
Monocytes Relative: 9 %
Neutro Abs: 4.4 10*3/uL (ref 1.7–7.7)
Neutrophils Relative %: 60 %
Platelets: 238 10*3/uL (ref 150–400)
RBC: 3.57 MIL/uL — ABNORMAL LOW (ref 4.22–5.81)
RDW: 15.5 % (ref 11.5–15.5)
WBC: 7.6 10*3/uL (ref 4.0–10.5)
nRBC: 0 % (ref 0.0–0.2)

## 2019-08-23 LAB — COMPREHENSIVE METABOLIC PANEL
ALT: 30 U/L (ref 0–44)
AST: 29 U/L (ref 15–41)
Albumin: 3.6 g/dL (ref 3.5–5.0)
Alkaline Phosphatase: 47 U/L (ref 38–126)
Anion gap: 16 — ABNORMAL HIGH (ref 5–15)
BUN: 11 mg/dL (ref 6–20)
CO2: 21 mmol/L — ABNORMAL LOW (ref 22–32)
Calcium: 8.9 mg/dL (ref 8.9–10.3)
Chloride: 102 mmol/L (ref 98–111)
Creatinine, Ser: 0.79 mg/dL (ref 0.61–1.24)
GFR calc Af Amer: >60 mL/min (ref >60)
GFR calc non Af Amer: >60 mL/min (ref >60)
Glucose, Bld: 212 mg/dL — ABNORMAL HIGH (ref 70–99)
Potassium: 3.4 mmol/L — ABNORMAL LOW (ref 3.5–5.1)
Sodium: 139 mmol/L (ref 135–145)
Total Bilirubin: 0.6 mg/dL (ref 0.3–1.2)
Total Protein: 6.6 g/dL (ref 6.5–8.1)

## 2019-08-23 LAB — LACTIC ACID, PLASMA
Lactic Acid, Venous: 4.4 mmol/L (ref 0.5–1.9)
Lactic Acid, Venous: 3.4 mmol/L (ref 0.5–1.9)
Lactic Acid, Venous: 2.9 mmol/L (ref 0.5–1.9)
Lactic Acid, Venous: 3.3 mmol/L (ref 0.5–1.9)

## 2019-08-23 LAB — GLUCOSE, CAPILLARY
Glucose-Capillary: 220 mg/dL — ABNORMAL HIGH (ref 70–99)
Glucose-Capillary: 315 mg/dL — ABNORMAL HIGH (ref 70–99)

## 2019-08-23 LAB — SARS CORONAVIRUS 2 (TAT 6-24 HRS): SARS Coronavirus 2: NEGATIVE

## 2019-08-23 LAB — MAGNESIUM: Magnesium: 1.5 mg/dL — ABNORMAL LOW (ref 1.7–2.4)

## 2019-08-23 LAB — BRAIN NATRIURETIC PEPTIDE: B Natriuretic Peptide: 58.1 pg/mL (ref 0.0–100.0)

## 2019-08-23 MED ORDER — ENOXAPARIN SODIUM 80 MG/0.8ML ~~LOC~~ SOLN: 70.0000 mg | SUBCUTANEOUS | Status: DC

## 2019-08-23 MED ORDER — DULOXETINE HCL 60 MG PO CPEP
60.0000 mg | ORAL_CAPSULE | Freq: Every day | ORAL | Status: DC
  Administered 2019-08-23: 60 mg via ORAL
  Filled 2019-08-23: qty 1

## 2019-08-23 MED ORDER — PREGABALIN 100 MG PO CAPS
300.0000 mg | ORAL_CAPSULE | Freq: Two times a day (BID) | ORAL | Status: DC
  Administered 2019-08-23 (×2): 300 mg via ORAL
  Filled 2019-08-23 (×2): qty 3

## 2019-08-23 MED ORDER — ASPIRIN 81 MG PO CHEW
81.0000 mg | CHEWABLE_TABLET | Freq: Every day | ORAL | Status: DC
  Administered 2019-08-23: 81 mg via ORAL
  Filled 2019-08-23: qty 1

## 2019-08-23 MED ORDER — ISOSORBIDE MONONITRATE ER 60 MG PO TB24
60.0000 mg | ORAL_TABLET | Freq: Every day | ORAL | Status: DC
  Administered 2019-08-23: 60 mg via ORAL
  Filled 2019-08-23: qty 1

## 2019-08-23 MED ORDER — ALBUTEROL SULFATE (2.5 MG/3ML) 0.083% IN NEBU: 3.0000 mL | INHALATION_SOLUTION | Freq: Four times a day (QID) | RESPIRATORY_TRACT | Status: DC | PRN

## 2019-08-23 MED ORDER — ENOXAPARIN SODIUM 40 MG/0.4ML ~~LOC~~ SOLN
40.0000 mg | SUBCUTANEOUS | Status: DC
  Administered 2019-08-23: 40 mg via SUBCUTANEOUS
  Filled 2019-08-23: qty 0.4

## 2019-08-23 MED ORDER — INSULIN ASPART 100 UNIT/ML ~~LOC~~ SOLN: 0.0000 [IU] | Freq: Every day | SUBCUTANEOUS | Status: DC

## 2019-08-23 MED ORDER — PANTOPRAZOLE SODIUM 40 MG PO TBEC
40.0000 mg | DELAYED_RELEASE_TABLET | Freq: Every day | ORAL | Status: DC
  Administered 2019-08-23: 40 mg via ORAL
  Filled 2019-08-23: qty 1

## 2019-08-23 MED ORDER — HYDRALAZINE HCL 50 MG PO TABS: 50.0000 mg | ORAL_TABLET | Freq: Four times a day (QID) | ORAL | Status: DC | PRN

## 2019-08-23 MED ORDER — FUROSEMIDE 40 MG PO TABS: 40.0000 mg | ORAL_TABLET | Freq: Three times a day (TID) | ORAL | 0 refills | Status: DC

## 2019-08-23 MED ORDER — ACETAMINOPHEN 325 MG PO TABS: 650.0000 mg | ORAL_TABLET | ORAL | Status: DC | PRN

## 2019-08-23 MED ORDER — SERTRALINE HCL 50 MG PO TABS
50.0000 mg | ORAL_TABLET | Freq: Every day | ORAL | Status: DC
  Administered 2019-08-23: 50 mg via ORAL
  Filled 2019-08-23: qty 1

## 2019-08-23 MED ORDER — SODIUM CHLORIDE 0.9% FLUSH: 3.0000 mL | INTRAVENOUS | Status: DC | PRN

## 2019-08-23 MED ORDER — INSULIN ASPART 100 UNIT/ML ~~LOC~~ SOLN
4.0000 [IU] | Freq: Three times a day (TID) | SUBCUTANEOUS | Status: DC
  Administered 2019-08-23 (×2): 4 [IU] via SUBCUTANEOUS

## 2019-08-23 MED ORDER — FUROSEMIDE 10 MG/ML IJ SOLN
40.0000 mg | Freq: Two times a day (BID) | INTRAMUSCULAR | Status: DC
  Administered 2019-08-23: 40 mg via INTRAVENOUS
  Filled 2019-08-23: qty 4

## 2019-08-23 MED ORDER — TRAZODONE HCL 50 MG PO TABS: 50.0000 mg | ORAL_TABLET | Freq: Every day | ORAL | Status: DC

## 2019-08-23 MED ORDER — CARVEDILOL 25 MG PO TABS
25.0000 mg | ORAL_TABLET | Freq: Two times a day (BID) | ORAL | Status: DC
  Administered 2019-08-23: 25 mg via ORAL
  Filled 2019-08-23: qty 1

## 2019-08-23 MED ORDER — POTASSIUM CHLORIDE CRYS ER 20 MEQ PO TBCR
40.0000 meq | EXTENDED_RELEASE_TABLET | Freq: Once | ORAL | Status: AC
  Administered 2019-08-23: 40 meq via ORAL
  Filled 2019-08-23: qty 2

## 2019-08-23 MED ORDER — OXYCODONE HCL 5 MG PO TABS
15.0000 mg | ORAL_TABLET | Freq: Three times a day (TID) | ORAL | Status: DC | PRN
  Administered 2019-08-23: 15 mg via ORAL
  Filled 2019-08-23: qty 3

## 2019-08-23 MED ORDER — SODIUM CHLORIDE 0.9 % IV BOLUS
4000.0000 mL | Freq: Once | INTRAVENOUS | Status: AC
  Administered 2019-08-23: 1000 mL via INTRAVENOUS

## 2019-08-23 MED ORDER — OXYCODONE HCL ER 10 MG PO T12A
30.0000 mg | EXTENDED_RELEASE_TABLET | Freq: Two times a day (BID) | ORAL | Status: DC
  Administered 2019-08-23: 30 mg via ORAL
  Filled 2019-08-23: qty 3

## 2019-08-23 MED ORDER — SODIUM CHLORIDE 0.9% FLUSH
3.0000 mL | Freq: Two times a day (BID) | INTRAVENOUS | Status: DC
  Administered 2019-08-23 (×2): 3 mL via INTRAVENOUS

## 2019-08-23 MED ORDER — INSULIN ASPART 100 UNIT/ML ~~LOC~~ SOLN
0.0000 [IU] | Freq: Three times a day (TID) | SUBCUTANEOUS | Status: DC
  Administered 2019-08-23: 11 [IU] via SUBCUTANEOUS
  Administered 2019-08-23: 5 [IU] via SUBCUTANEOUS

## 2019-08-23 MED ORDER — ATORVASTATIN CALCIUM 80 MG PO TABS: 80.0000 mg | ORAL_TABLET | Freq: Every day | ORAL | Status: DC

## 2019-08-23 MED ORDER — TIZANIDINE HCL 4 MG PO TABS: 2.0000 mg | ORAL_TABLET | Freq: Two times a day (BID) | ORAL | Status: DC | PRN

## 2019-08-23 MED ORDER — ONDANSETRON HCL 4 MG/2ML IJ SOLN: 4.0000 mg | Freq: Four times a day (QID) | INTRAMUSCULAR | Status: DC | PRN

## 2019-08-23 MED ORDER — INSULIN DETEMIR 100 UNIT/ML ~~LOC~~ SOLN
50.0000 [IU] | Freq: Every day | SUBCUTANEOUS | Status: DC
  Administered 2019-08-23: 50 [IU] via SUBCUTANEOUS
  Filled 2019-08-23: qty 0.5

## 2019-08-23 MED ORDER — SODIUM CHLORIDE 0.9 % IV SOLN: 250.0000 mL | INTRAVENOUS | Status: DC | PRN

## 2019-08-23 NOTE — H&P (Signed)
History and Physical    [name] [id number] DOB: [birth year] DOA: 08/22/2019  PCP: [name], FNP   Patient coming from: Home   Chief Complaint: SOB, orthopnea, bilateral leg and arm swelling   HPI: [name] is a 58 y.o. male with medical history significant for coronary artery disease with stent, chronic diastolic CHF, chronic back pain, depression, and poorly controlled type 2 diabetes mellitus with neuropathy, now presenting to the emergency department for evaluation of leg swelling, bilateral arm swelling, shortness of breath, and orthopnea.  Patient was admitted to the hospital earlier this month with hypotension, this was felt to be secondary to diuretics and antihypertensives, he was hydrated with IVF, and diuretics held at discharge.  Since that time, patient reports insidiously worsening bilateral lower extremity edema, approximately 1 day of bilateral upper extremity swelling, and 1 day of shortness of breath.  Dyspnea is worse with exertion or lying flat.  Patient reports "I feel like I am drowning" when he tries to lay down.  He denies any chest pain, fevers, chills, or cough.  He denies any lower extremity tenderness or color change.  ED Course: Upon arrival to the ED, patient is found to be afebrile, saturating well on room air, and with stable blood pressure.  EKG features a sinus rhythm and chest x-ray is negative for acute cardiopulmonary disease.  Chemistry panel is notable for mild hypokalemia, glucose 212, and anion gap 16.  CBC is notable for a normocytic anemia with hemoglobin 10.3, down from 12.7 earlier this month.  Lactic acid was elevated to 4.4.  COVID-19 testing has been ordered but not yet performed.  Review of Systems:  All other systems reviewed and apart from HPI, are negative.  Past Medical History:  Diagnosis Date  . Anemia   . Arthritis   . Back pain   . CAD (coronary artery disease)    a. s/p DES to LAD in 05/2016  . Cervical  radiculopathy   . Chronic diastolic CHF (congestive heart failure) ([place])   . Chronic pain   . Depression   . DVT (deep venous thrombosis) ([place])   . Hematemesis   . Hepatic steatosis   . Hyperlipidemia   . Hypertension   . IBS (irritable bowel syndrome)   . Morbid obesity ([place])   . OSA (obstructive sleep apnea)   . Pancreatitis   . PE (pulmonary thromboembolism) ([place])   . Peripheral neuropathy   . PUD (peptic ulcer disease)   . Renal disorder   . Stroke [facility])    a. ?details unclear - not seen on imaging when he was admitted in 05/2017 for TIA symptoms which were felt due to cervical radiculopathy.  . Thoracic aortic ectasia ([facility])    a. 4.3cm ectatic ascending thoracic aorta by CT 06/2017.   . Type 2 diabetes mellitus ([place])     Past Surgical History:  Procedure Laterality Date  . CARDIAC CATHETERIZATION N/A 05/31/2016   Procedure: Left Heart Cath and Coronary Angiography;  Surgeon: Peter M Martinique, MD;  Location: [place] [facility];  Service: Cardiovascular;  Laterality: N/A;  . CARDIAC CATHETERIZATION N/A 05/31/2016   Procedure: Intravascular Pressure Wire/FFR Study;  Surgeon: Peter M Martinique, MD;  Location: [place] [facility];  Service: Cardiovascular;  Laterality: N/A;  . CARDIAC CATHETERIZATION N/A 05/31/2016   Procedure: Coronary Stent Intervention;  Surgeon: Peter M Martinique, MD;  Location: [place] [facility];  Service: Cardiovascular;  Laterality: N/A;  . LEFT HEART CATH AND  CORONARY ANGIOGRAPHY N/A 12/08/2017   Procedure: LEFT HEART CATH AND CORONARY ANGIOGRAPHY;  Surgeon: Leonie Man, MD;  Location: [place] [facility];  Service: Cardiovascular;  Laterality: N/A;  . LEFT HEART CATHETERIZATION WITH CORONARY ANGIOGRAM N/A 02/03/2014   Procedure: LEFT HEART CATHETERIZATION WITH CORONARY ANGIOGRAM;  Surgeon: Pixie Casino, MD;  Location: [facility] CATH LAB;  Service: Cardiovascular;  Laterality: N/A;  . left leg stent        reports that he has never smoked. He has never used smokeless  tobacco. He reports that he does not drink alcohol or use drugs.  Allergies  Allergen Reactions  . Coconut Flavor [Flavoring Agent] Hives  . Coconut Oil Hives  . Ibuprofen Other (See Comments)    Made gastric ulcers worse  . Aleve [Naproxen] Other (See Comments)    DUE TO KIDNEYS  . Nsaids Other (See Comments)    Stomach ulcers     Family History  Problem Relation Age of Onset  . Cancer Father   . Hypertension Mother   . Diabetes Mother   . Breast cancer Mother   . Hypertension Brother   . Diabetes Brother   . Hypertension Sister   . Diabetes Sister      Prior to Admission medications   Medication Sig Start Date End Date Taking? Authorizing Provider  Accu-Chek FastClix Lancets MISC 1 Device by Other route 2 (two) times daily.  03/20/19  Yes [provider]  Albuterol Sulfate (PROAIR RESPICLICK) 123XX123 (90 Base) MCG/ACT AEPB Inhale 2 puffs into the lungs every 6 (six) hours as needed. Patient taking differently: Inhale 2 puffs into the lungs every 6 (six) hours as needed (for breathing).  11/06/18  Yes Robyn Haber, MD  aspirin 81 MG chewable tablet Chew 1 tablet (81 mg total) by mouth daily. 06/01/16  Yes Elwin Mocha, MD  atorvastatin (LIPITOR) 80 MG tablet Take 1 tablet (80 mg total) by mouth daily. 01/26/17  Yes Strader, [place] M, PA-C  carvedilol (COREG) 25 MG tablet Take 1 tablet (25 mg total) by mouth 2 (two) times daily with a meal. 01/26/17  Yes Strader, [place] M, PA-C  DULoxetine (CYMBALTA) 60 MG capsule TAKE 1 CAPSULE BY MOUTH EVERY DAY Patient taking differently: Take 60 mg by mouth daily.  12/24/18  Yes Marcial Pacas, MD  glipiZIDE (GLUCOTROL) 10 MG tablet Take 10 mg by mouth 2 (two) times daily. 05/30/17  Yes [provider]  Insulin Detemir (LEVEMIR FLEXTOUCH) 100 UNIT/ML Pen Inject 60 Units into the skin daily. 05/06/19  Yes Black, Lezlie Octave, NP  Insulin Pen Needle 31G X 5 MM MISC Use 1 needle daily to inject insulin as prescribed 06/18/17  Yes Barton Dubois, MD  isosorbide mononitrate (IMDUR) 60 MG 24 hr tablet Take 1 tablet (60 mg total) by mouth daily. 02/06/19  Yes Skeet Latch, MD  metFORMIN (GLUCOPHAGE-XR) 500 MG 24 hr tablet Take 2 tablets (1,000 mg total) by mouth 2 (two) times daily. 07/02/17  Yes Barton Dubois, MD  nitroGLYCERIN (NITROSTAT) 0.4 MG SL tablet Place 1 tablet (0.4 mg total) under the tongue every 5 (five) minutes as needed for chest pain. 02/06/19  Yes Skeet Latch, MD  NOVOLOG FLEXPEN 100 UNIT/ML FlexPen Inject 16-22 Units into the skin 3 (three) times daily with meals. Per sliding scale 05/13/19  Yes [provider]  nystatin (MYCOSTATIN) 100000 UNIT/ML suspension Take 5 mLs (500,000 Units total) by mouth 4 (four) times daily. 08/04/19  Yes Regalado, Cassie Freer, MD  oxyCODONE (  ROXICODONE) 15 MG immediate release tablet Take 15 mg 3 (three) times daily as needed by mouth for pain.  08/28/17  Yes [provider]  pantoprazole (PROTONIX) 40 MG tablet Take 40 mg by mouth daily. 08/12/19  Yes [provider]  pregabalin (LYRICA) 300 MG capsule Take 300 mg by mouth 2 (two) times daily. 05/18/19  Yes [provider]  sertraline (ZOLOFT) 50 MG tablet Take 50 mg by mouth daily.   Yes [provider]  tiZANidine (ZANAFLEX) 2 MG tablet Take 2 mg by mouth 2 (two) times daily as needed for pain.   Yes [provider]  traZODone (DESYREL) 50 MG tablet Take 1 tablet (50 mg total) by mouth at bedtime. 12/15/16  Yes Burns, Arloa Koh, MD  Vitamin D, Ergocalciferol, (DRISDOL) 1.25 MG (50000 UT) CAPS capsule Take 50,000 Units by mouth once a week. Wednesdays 08/15/19  Yes [provider]  XTAMPZA ER 27 MG C12A Take 27 mg by mouth 2 (two) times daily. 08/15/19  Yes [provider]  diclofenac sodium (VOLTAREN) 1 % GEL Apply 1 application topically 4 (four) times daily. Patient not taking: Reported on 08/22/2019 04/05/18   Melynda Ripple, MD    Physical Exam: Vitals:    08/23/19 0030 08/23/19 0100 08/23/19 0145 08/23/19 0215  BP: (!) 153/86 (!) 144/83 (!) 150/95 (!) 171/91  Pulse: 80 79 70 71  Resp: 20 (!) 23 (!) 22 (!) 22  Temp:      TempSrc:      SpO2: 94% 95% 96% 97%  Weight:      Height:        Constitutional: NAD, calm  Eyes: PERTLA, lids and conjunctivae normal ENMT: Mucous membranes are moist. Posterior pharynx clear of any exudate or lesions.   Neck: normal, supple, no masses, no thyromegaly Respiratory: no wheezing, no crackles. No accessory muscle use.  Cardiovascular: S1 & S2 heard, regular rate and rhythm. Pretibial pitting edema bilaterally.   Abdomen: No distension, no tenderness, no masses palpated. Bowel sounds normal.  Musculoskeletal: no clubbing / cyanosis. No joint deformity upper and lower extremities.    Skin: no significant rashes, lesions, ulcers. Warm, dry, well-perfused. Neurologic: No facial asymmetry. Sensation intact. Moving all extremities.  Psychiatric: Alert and oriented to person, place, and situation. Pleasant, cooperative.    Labs on Admission: I have personally reviewed following labs and imaging studies  CBC: Recent Labs  Lab 08/22/19 1626 08/23/19 0000  WBC 7.0  --   HGB 10.3* 10.2*  HCT 32.3* 30.0*  MCV 92.3  --   PLT 245  --    Basic Metabolic Panel: Recent Labs  Lab 08/22/19 1626 08/22/19 2345 08/23/19 0000  NA 136 139 139  K 4.0 3.4* 3.2*  CL 98 102  --   CO2 19* 21*  --   GLUCOSE 399* 212*  --   BUN 12 11  --   CREATININE 1.04 0.79  --   CALCIUM 8.8* 8.9  --    GFR: Estimated Creatinine Clearance: 147.6 mL/min (by C-G formula based on SCr of 0.79 mg/dL). Liver Function Tests: Recent Labs  Lab 08/22/19 2345  AST 29  ALT 30  ALKPHOS 47  BILITOT 0.6  PROT 6.6  ALBUMIN 3.6   No results for input(s): LIPASE, AMYLASE in the last 168 hours. No results for input(s): AMMONIA in the last 168 hours. Coagulation Profile: No results for input(s): INR, PROTIME in the last 168 hours.  Cardiac Enzymes: No results for input(s): CKTOTAL,  CKMB, CKMBINDEX, TROPONINI in the last 168 hours. BNP (last 3 results) No results for input(s): PROBNP in the last 8760 hours. HbA1C: No results for input(s): HGBA1C in the last 72 hours. CBG: Recent Labs  Lab 08/22/19 2220 08/23/19 0138  GLUCAP 209* 205*   Lipid Profile: No results for input(s): CHOL, HDL, LDLCALC, TRIG, CHOLHDL, LDLDIRECT in the last 72 hours. Thyroid Function Tests: No results for input(s): TSH, T4TOTAL, FREET4, T3FREE, THYROIDAB in the last 72 hours. Anemia Panel: No results for input(s): VITAMINB12, FOLATE, FERRITIN, TIBC, IRON, RETICCTPCT in the last 72 hours. Urine analysis:    Component Value Date/Time   COLORURINE YELLOW 07/31/2019 2035   APPEARANCEUR CLEAR 07/31/2019 2035   LABSPEC 1.017 07/31/2019 2035   PHURINE 5.0 07/31/2019 2035   GLUCOSEU >=500 (A) 07/31/2019 2035   HGBUR NEGATIVE 07/31/2019 2035   BILIRUBINUR NEGATIVE 07/31/2019 2035   KETONESUR NEGATIVE 07/31/2019 2035   PROTEINUR NEGATIVE 07/31/2019 2035   UROBILINOGEN 0.2 05/27/2014 2312   NITRITE NEGATIVE 07/31/2019 2035   LEUKOCYTESUR NEGATIVE 07/31/2019 2035   Sepsis Labs: @LABRCNTIP (procalcitonin:4,lacticidven:4) )No results found for this or any previous visit (from the past 240 hour(s)).   Radiological Exams on Admission: Dg Chest 2 View  Result Date: 08/22/2019 CLINICAL DATA:  Short of breath EXAM: CHEST - 2 VIEW COMPARISON:  07/31/2019 FINDINGS: The heart size and mediastinal contours are within normal limits. Both lungs are clear. Degenerative changes of the spine. IMPRESSION: No active cardiopulmonary disease. Electronically Signed   By: Donavan Foil M.D.   On: 08/22/2019 16:53    EKG: Independently reviewed. Sinus rhythm, rate 88, 462 ms.   Assessment/Plan   1. Acute on chronic diastolic CHF  - Presents with insidiously worsening leg swelling, 1 day of b/l arm swelling, and 1 day of SOB and orthopnea  - He had been  admitted with hypotension 3 wks ago, diuretics were held, he was hydrated with IVF, and diuretics held at d/c  - Fluid status difficult clinically d/t body habitus; BNP is normal in ED but much higher than any priors; BUN/Cr ratio lower than priors  - Echo from June 2020 with preserved EF  - Diurese with Lasix 40 mg IV q12h, continue beta-blocker, follow I/O's and daily wt, monitor renal function and electrolytes    2. Uncontrolled insulin-dependent DM    - A1c was 10.5% in July 2020  - Hold metformin and glipizide and continue Levemir with Novolog correctional    3. CAD  - No anginal complaints  - Continue ASA, statin, nitrates, and beta-blocker    4. Chronic pain  - Stable, continue home regimen with long- and short-acting oxycodone, Lyrica, Cymbalta, and Zanaflex    5. Anemia  - Hgb is 10.3 in ED, down from 12.7 two weeks earlier  - He denies melena or hematochezia and this could reflect hypervolemia   6. Lactic acidosis  - Lactic acid is 4.4 in ED  - There is no fever or leukocytosis and BP has remained normal to elevated   - Appears to be chronically elevated, possibly related to metformin  - He appears hypervolemic, complains of swelling and orthopnea, and lactate may decrease with diuresis    PPE: Mask, face shield  DVT prophylaxis: Lovenox  Code Status: Full  Family Communication: Discussed with patient  Consults called: none  Admission status: Observation     Vianne Bulls, MD [facility] Pager [phone]  If 7PM-7AM, please contact night-coverage [email or web address] Password [email or web address]  08/23/2019, 2:33 AM

## 2019-08-23 NOTE — ED Notes (Signed)
Order for 4L bolus dropped to 2L

## 2019-08-23 NOTE — Progress Notes (Addendum)
Inpatient Diabetes Program Recommendations  AACE/ADA: New Consensus Statement on Inpatient Glycemic Control (2015)  Target Ranges:  Prepandial:   less than 140 mg/dL      Peak postprandial:   less than 180 mg/dL (1-2 hours)      Critically ill patients:  140 - 180 mg/dL   Lab Results  Component Value Date   GLUCAP 220 (H) 08/23/2019   HGBA1C 10.5 (H) 05/05/2019    Review of Glycemic Control  Diabetes history: Type 2 Outpatient Diabetes medications: Glucotrol 10mg  BID, Metformin 1000 BID, levemir 60 Current orders for Inpatient glycemic control: levemir 50u once a day, novolog 0-5 HS, novolog 0-15 TID & novolog 4 with meals.  Inpatient Diabetes Program Recommendations:  Patient was seen by diabetes coordinator during last admission (discharged 08/04/19) for A1c of 10.3 in July 2020.  Sees Peri Jefferson for endocrinology; last visit was via telehealth on 9/28.  Per previous note he has a meter and sometimes misses checking his sugar.  He was not interested in a dietician consult or diabetes referrals at that time.  A1c now 11.4 on 07/19/19 from [facility].  Started on current diabetes medications  this morning.  Will monitor blood sugars and speak with patient about increased A1c.    10/30 @1415 -spoke with patient in room.  He is preparing for discharge.  Patient is aware his A1c has increased to 11.4 from 10.5.  He states it is related to his hospitalizations and frequent changes made by all physicians.  Suggested he check 2 hours postprandial; he states he does not wish to carry his meter around.   He states he checks his blood sugar but misses sometimes.  He drinks 1 cup of O.J. and milk each morning.  Suggested he decreased to 1/2 cup of O.J. He states he would rather not.  He states at this time he is not willing to make any changes to his diet.  He is keeping his scheduled appointments with PCP, endocrinology and dietician.  He has an appointment with Dr. Ronnald Ramp in November.     Thanks, Geoffry Paradise, RN, BSN Diabetes Coordinator [phone] (8a-5p)

## 2019-08-23 NOTE — TOC Progression Note (Signed)
Transition of Care [facility]) - Progression Note    Patient Details  Name: [name] MRN: [id number] Date of Birth: [birth year]  [facility] of [facility]) CM/SW Contact  Zenon Mayo, RN Phone Number: 08/23/2019, 12:48 PM  Clinical Narrative:    NCM spoke with patient, he states he does not want a HHRN , he has an aide with [name] and will continue with that services.  He has no other needs.        Expected Discharge Plan and Services           Expected Discharge Date: 08/23/19                                     Social Determinants of Health (SDOH) Interventions    Readmission Risk Interventions No flowsheet data found.

## 2019-08-23 NOTE — ED Provider Notes (Signed)
Attestation: Medical screening examination/treatment/procedure(s) were conducted as a shared visit with non-physician practitioner(s) and myself.  I personally evaluated the patient during the encounter.  Briefly, the patient is a 58 y.o. male with a history of type 2 diabetes, CAD status post stenting, chronic diastolic heart failure, DVT, PE, sleep apnea who presents to the emergency department with shortness of breath worse with exertion.  Reports peripheral edema.  No infectious symptoms.   BP (!) 149/91   Pulse 92   Temp 98.5 F (36.9 C) (Oral)   Resp (!) 26   Ht 5\' 10"  (1.778 m)   Wt (!) 140.6 kg   SpO2 97%   BMI 44.48 kg/m   Physical Exam Vitals signs reviewed.  Constitutional:      General: He is not in acute distress.    Appearance: He is well-developed. He is not diaphoretic.  HENT:     Head: Normocephalic and atraumatic.     Nose: Nose normal.  Eyes:     General: No scleral icterus.       Right eye: No discharge.        Left eye: No discharge.     Conjunctiva/sclera: Conjunctivae normal.     Pupils: Pupils are equal, round, and reactive to light.  Neck:     Musculoskeletal: Normal range of motion and neck supple.  Cardiovascular:     Rate and Rhythm: Normal rate and regular rhythm.     Heart sounds: No murmur. No friction rub. No gallop.   Pulmonary:     Effort: Pulmonary effort is normal. No respiratory distress.     Breath sounds: Normal breath sounds. No stridor. No rales.  Abdominal:     General: There is no distension.     Palpations: Abdomen is soft.     Tenderness: There is no abdominal tenderness.  Musculoskeletal:        General: No tenderness.     Right lower leg: 1+ Pitting Edema present.     Left lower leg: 1+ Pitting Edema present.  Skin:    General: Skin is warm and dry.     Findings: No erythema or rash.  Neurological:     Mental Status: He is alert and oriented to person, place, and time.       EKG  Interpretation  Date/Time:  Thursday August 22 2019 16:02:26 EDT Ventricular Rate:  88 PR Interval:  160 QRS Duration: 80 QT Interval:  382 QTC Calculation: 462 R Axis:   72 Text Interpretation: Normal sinus rhythm Normal ECG No significant change since last tracing Confirmed by Addison Lank [phone]) on 08/22/2019 11:04:30 PM       Work up notable for hyperglycemia with evidence of early DKA.  Blood sugars improving without intervention.  Repeated metabolic panel shows closing gap.  VBG without acidosis.  Lactic acid was noted to be elevated.  Concerning for Metformin induced lactic acidosis.  He is afebrile and does not have infectious symptoms concerning for sepsis.  Patient will be admitted to medicine for continued work-up and management.   CRITICAL CARE Performed by: Grayce Sessions  Total critical care time: 15 minutes Critical care time was exclusive of separately billable procedures and treating other patients. Critical care was necessary to treat or prevent imminent or life-threatening deterioration. Critical care was time spent personally by me on the following activities: development of treatment plan with patient and/or surrogate as well as nursing, discussions with consultants, evaluation of patient's response to treatment, examination of patient, obtaining  history from patient or surrogate, ordering and performing treatments and interventions, ordering and review of laboratory studies, ordering and review of radiographic studies, pulse oximetry and re-evaluation of patient's condition.     Fatima Blank, MD 08/23/19 [phone]

## 2019-08-23 NOTE — Discharge Summary (Signed)
Physician Discharge Summary  [name] [id number] DOB: 07-19-1961 DOA: 08/22/2019  PCP: [name], FNP  Admit date: 08/22/2019 Discharge date: 08/23/2019  Admitted From: Home Disposition: Home  Recommendations for Outpatient Follow-up:  1. Follow up with PCP in 1-2 weeks 2. Please obtain BMP/CBC in one week 3. Please follow up on the following pending results:  Home Health: None Equipment/Devices: None  Discharge Condition: Stable CODE STATUS: Full code Diet recommendation: Low-sodium/cardiac  Subjective: Seen and examined.  He feels much better.  No more shortness of breath.  He states that his swelling has improved significantly just the last few hours.  He is agreeable to go home.  Brief/Interim Summary: [name] is a 58 y.o. male with medical history significant for coronary artery disease with stent, chronic diastolic CHF, chronic back pain, depression, and poorly controlled type 2 diabetes mellitus with neuropathy who presented to ED yesterday with a complaint of bilateral arm and leg swelling and orthopnea. Patient was admitted to the hospital earlier this month with hypotension, this was felt to be secondary to diuretics and antihypertensives, he was hydrated with IVF, and according to the discharge summary that I personally reviewed, his diuretics were held at discharge however patient states that he was told to reduce his Lasix dose from 40 mg 3 times daily to twice daily and he has been taking twice a day however despite of that, since that time, patient reports insidiously worsening bilateral lower extremity edema, approximately 1 day of bilateral upper extremity swelling, and 1 day of shortness of breath.  Dyspnea is worse with exertion or lying flat.  Upon arrival to the ED, patient was afebrile and saturating well on room air, and with stable blood pressure.  EKG features a sinus rhythm and chest x-ray is negative for acute cardiopulmonary disease.   Chemistry panel is notable for mild hypokalemia 3.4, glucose 212, and anion gap 16.  CBC is notable for a normocytic anemia with hemoglobin 10.3, down from 12.7 earlier this month.  Lactic acid was elevated to 4.4.  COVID-19 negative.  He was admitted under hospital service with acute on chronic diastolic congestive heart failure and edema.  He was given IV Lasix in the emergency department and with this was continued.  When seen today, he states that he does not have any more shortness of breath and he feels like his swelling in both upper and lower extremities have improved significantly after diuresis.  Currently he has +1 pitting edema bilateral lower extremity and trace pitting edema bilateral upper extremity.  Patient feels comfortable going home.  Now that his blood pressure has remained stable, I am sending him home back with his prior regimen of Lasix which was 40 mg 3 times daily.  He has chronic lactic acidosis based on the chart review and it could very well be due to Metformin.  He is on high-dose of long-acting insulin and glipizide.  Although his hemoglobin A1c is higher but this can be managed with increasing dose of his Lantus so I have advised him to discontinue Metformin.  He is being discharged in a stable condition and he is in agreement with the plan.  Discharge Diagnoses:  Principal Problem:   Acute on chronic diastolic CHF (congestive heart failure) (HCC) Active Problems:   Lactic acidosis   CAD (coronary artery disease)   Normocytic normochromic anemia   Chronic back pain   Uncontrolled type 2 diabetes mellitus with hyperglycemia [facility])    Discharge Instructions  Discharge Instructions    Discharge patient   Complete by: As directed    Discharge disposition: 01-Home or Self Care   Discharge patient date: 08/23/2019     Allergies as of 08/23/2019      Reactions   Coconut Flavor [flavoring Agent] Hives   Coconut Oil Hives   Ibuprofen Other (See Comments)   Made  gastric ulcers worse   Aleve [naproxen] Other (See Comments)   DUE TO KIDNEYS   Nsaids Other (See Comments)   Stomach ulcers      Medication List    STOP taking these medications   metFORMIN 500 MG 24 hr tablet Commonly known as: GLUCOPHAGE-XR     TAKE these medications   Accu-Chek FastClix Lancets Misc 1 Device by Other route 2 (two) times daily.   Albuterol Sulfate 108 (90 Base) MCG/ACT Aepb Commonly known as: ProAir RespiClick Inhale 2 puffs into the lungs every 6 (six) hours as needed. What changed: reasons to take this   aspirin 81 MG chewable tablet Chew 1 tablet (81 mg total) by mouth daily.   atorvastatin 80 MG tablet Commonly known as: LIPITOR Take 1 tablet (80 mg total) by mouth daily.   carvedilol 25 MG tablet Commonly known as: COREG Take 1 tablet (25 mg total) by mouth 2 (two) times daily with a meal.   diclofenac sodium 1 % Gel Commonly known as: VOLTAREN Apply 1 application topically 4 (four) times daily.   DULoxetine 60 MG capsule Commonly known as: CYMBALTA TAKE 1 CAPSULE BY MOUTH EVERY DAY What changed: how much to take   furosemide 40 MG tablet Commonly known as: Lasix Take 1 tablet (40 mg total) by mouth 3 (three) times daily.   glipiZIDE 10 MG tablet Commonly known as: GLUCOTROL Take 10 mg by mouth 2 (two) times daily.   Insulin Pen Needle 31G X 5 MM Misc Use 1 needle daily to inject insulin as prescribed   isosorbide mononitrate 60 MG 24 hr tablet Commonly known as: IMDUR Take 1 tablet (60 mg total) by mouth daily.   Levemir FlexTouch 100 UNIT/ML Pen Generic drug: Insulin Detemir Inject 60 Units into the skin daily.   nitroGLYCERIN 0.4 MG SL tablet Commonly known as: NITROSTAT Place 1 tablet (0.4 mg total) under the tongue every 5 (five) minutes as needed for chest pain.   NovoLOG FlexPen 100 UNIT/ML FlexPen Generic drug: insulin aspart Inject 16-22 Units into the skin 3 (three) times daily with meals. Per sliding scale    nystatin 100000 UNIT/ML suspension Commonly known as: MYCOSTATIN Take 5 mLs (500,000 Units total) by mouth 4 (four) times daily.   oxyCODONE 15 MG immediate release tablet Commonly known as: ROXICODONE Take 15 mg 3 (three) times daily as needed by mouth for pain.   pantoprazole 40 MG tablet Commonly known as: PROTONIX Take 40 mg by mouth daily.   pregabalin 300 MG capsule Commonly known as: LYRICA Take 300 mg by mouth 2 (two) times daily.   sertraline 50 MG tablet Commonly known as: ZOLOFT Take 50 mg by mouth daily.   tiZANidine 2 MG tablet Commonly known as: ZANAFLEX Take 2 mg by mouth 2 (two) times daily as needed for pain.   traZODone 50 MG tablet Commonly known as: DESYREL Take 1 tablet (50 mg total) by mouth at bedtime.   Vitamin D (Ergocalciferol) 1.25 MG (50000 UT) Caps capsule Commonly known as: DRISDOL Take 50,000 Units by mouth once a week. Wednesdays   Xtampza ER 27 MG C12a Generic  drug: oxyCODONE ER Take 27 mg by mouth 2 (two) times daily.      Follow-up Information    [name], FNP Follow up in 1 week(s).   Specialty: Family Medicine Contact information: [place] [address] [phone]          Allergies  Allergen Reactions  . Coconut Flavor [Flavoring Agent] Hives  . Coconut Oil Hives  . Ibuprofen Other (See Comments)    Made gastric ulcers worse  . Aleve [Naproxen] Other (See Comments)    DUE TO KIDNEYS  . Nsaids Other (See Comments)    Stomach ulcers     Consultations: None   Procedures/Studies: Dg Chest 2 View  Result Date: 08/22/2019 CLINICAL DATA:  Short of breath EXAM: CHEST - 2 VIEW COMPARISON:  07/31/2019 FINDINGS: The heart size and mediastinal contours are within normal limits. Both lungs are clear. Degenerative changes of the spine. IMPRESSION: No active cardiopulmonary disease. Electronically Signed   By: Donavan Foil M.D.   On: 08/22/2019 16:53   Ct Angio Chest Pe W And/or Wo  Contrast  Result Date: 07/31/2019 CLINICAL DATA:  Concern for pulmonary embolism.  Weakness. EXAM: CT ANGIOGRAPHY CHEST CT ABDOMEN AND PELVIS WITH CONTRAST TECHNIQUE: Multidetector CT imaging of the chest was performed using the standard protocol during bolus administration of intravenous contrast. Multiplanar CT image reconstructions and MIPs were obtained to evaluate the vascular anatomy. Multidetector CT imaging of the abdomen and pelvis was performed using the standard protocol during bolus administration of intravenous contrast. CONTRAST:  150mL OMNIPAQUE IOHEXOL 350 MG/ML SOLN COMPARISON:  Chest CT 07/03/2018 FINDINGS: CTA CHEST FINDINGS Cardiovascular: No filling defects within the pulmonary arteries to localize acute pulmonary embolism. There is image degradation in the lower lobe pulmonary arteries secondary to patient body habitus with mottled streak artifact. No RIGHT ventricular strain. Mediastinum/Nodes: No axillary supraclavicular adenopathy. No mediastinal hilar adenopathy. No pericardial effusion. Esophagus normal. Lungs/Pleura: No pulmonary infarction. No airspace disease. No atelectasis. No effusion. Is Musculoskeletal: No rib fracture. Review of the MIP images confirms the above findings. CT ABDOMEN and PELVIS FINDINGS Hepatobiliary: Peripheral regions of hypo attenuation/hypoperfusion in the LEFT and RIGHT hepatic lobes. These ill-defined regions are seen on axial image 20/6 for example. No enhancing lesion. No biliary duct dilatation. Gallbladder normal. Pancreas: Normal pancreas Spleen: Low-density lesion in the spleen measures 3.6 cm. This is unchanged from CT 2018. Adrenals/Urinary Tract: Adrenal glands normal. Kidneys enhance uniformly ureters and bladder normal. Stomach/Bowel: Stomach, small bowel appendix and cecum normal. The colon and rectum sigmoid colon normal. Vascular/Lymphatic: Abdominal aorta is normal caliber. There is no retroperitoneal or periportal lymphadenopathy. No pelvic  lymphadenopathy. Reproductive: Prostate normal. Other: No free fluid. Musculoskeletal: No aggressive osseous lesion. Review of the MIP images confirms the above findings. IMPRESSION: Chest Impression: 1. No evidence acute pulmonary embolism. 2. No acute pulmonary parenchymal findings. Abdomen / Pelvis Impression: 1. Hypoperfusion in the periphery of the LEFT and RIGHT hepatic lobe is nonspecific finding and may represent a benign vascular phenomena. Consider serologic correlation for hepatitis. Abdominal/hepatic MRI could potentially help characterize hepatic abnormality 2. Hypoperfusion in the spleen unchanged from 2018 likely represents a hemangioma or cyst . Electronically Signed   By: Suzy Bouchard M.D.   On: 07/31/2019 19:04   Ct Abdomen Pelvis W Contrast  Result Date: 07/31/2019 CLINICAL DATA:  Concern for pulmonary embolism.  Weakness. EXAM: CT ANGIOGRAPHY CHEST CT ABDOMEN AND PELVIS WITH CONTRAST TECHNIQUE: Multidetector CT imaging of the chest was performed  using the standard protocol during bolus administration of intravenous contrast. Multiplanar CT image reconstructions and MIPs were obtained to evaluate the vascular anatomy. Multidetector CT imaging of the abdomen and pelvis was performed using the standard protocol during bolus administration of intravenous contrast. CONTRAST:  169mL OMNIPAQUE IOHEXOL 350 MG/ML SOLN COMPARISON:  Chest CT 07/03/2018 FINDINGS: CTA CHEST FINDINGS Cardiovascular: No filling defects within the pulmonary arteries to localize acute pulmonary embolism. There is image degradation in the lower lobe pulmonary arteries secondary to patient body habitus with mottled streak artifact. No RIGHT ventricular strain. Mediastinum/Nodes: No axillary supraclavicular adenopathy. No mediastinal hilar adenopathy. No pericardial effusion. Esophagus normal. Lungs/Pleura: No pulmonary infarction. No airspace disease. No atelectasis. No effusion. Is Musculoskeletal: No rib fracture. Review  of the MIP images confirms the above findings. CT ABDOMEN and PELVIS FINDINGS Hepatobiliary: Peripheral regions of hypo attenuation/hypoperfusion in the LEFT and RIGHT hepatic lobes. These ill-defined regions are seen on axial image 20/6 for example. No enhancing lesion. No biliary duct dilatation. Gallbladder normal. Pancreas: Normal pancreas Spleen: Low-density lesion in the spleen measures 3.6 cm. This is unchanged from CT 2018. Adrenals/Urinary Tract: Adrenal glands normal. Kidneys enhance uniformly ureters and bladder normal. Stomach/Bowel: Stomach, small bowel appendix and cecum normal. The colon and rectum sigmoid colon normal. Vascular/Lymphatic: Abdominal aorta is normal caliber. There is no retroperitoneal or periportal lymphadenopathy. No pelvic lymphadenopathy. Reproductive: Prostate normal. Other: No free fluid. Musculoskeletal: No aggressive osseous lesion. Review of the MIP images confirms the above findings. IMPRESSION: Chest Impression: 1. No evidence acute pulmonary embolism. 2. No acute pulmonary parenchymal findings. Abdomen / Pelvis Impression: 1. Hypoperfusion in the periphery of the LEFT and RIGHT hepatic lobe is nonspecific finding and may represent a benign vascular phenomena. Consider serologic correlation for hepatitis. Abdominal/hepatic MRI could potentially help characterize hepatic abnormality 2. Hypoperfusion in the spleen unchanged from 2018 likely represents a hemangioma or cyst . Electronically Signed   By: Suzy Bouchard M.D.   On: 07/31/2019 19:04   Dg Chest Port 1 View  Result Date: 07/31/2019 CLINICAL DATA:  Weakness, sleepiness EXAM: PORTABLE CHEST 1 VIEW COMPARISON:  06/05/2019 FINDINGS: The heart size and mediastinal contours are within normal limits. Both lungs are clear. The visualized skeletal structures are unremarkable. IMPRESSION: No active disease. Electronically Signed   By: Kathreen Devoid   On: 07/31/2019 17:07      Discharge Exam: Vitals:   08/23/19 0506  08/23/19 0813  BP: (!) 142/63 128/70  Pulse: 80 75  Resp: 16 20  Temp: 98.9 F (37.2 C) 98.4 F (36.9 C)  SpO2: 98% 96%   Vitals:   08/23/19 0330 08/23/19 0345 08/23/19 0506 08/23/19 0813  BP:   (!) 142/63 128/70  Pulse: 85 79 80 75  Resp: 19 19 16 20   Temp:   98.9 F (37.2 C) 98.4 F (36.9 C)  TempSrc:   Oral Oral  SpO2: 94% 93% 98% 96%  Weight:   (!) 147 kg   Height:   5\' 10"  (1.778 m)     General: Pt is alert, awake, not in acute distress Cardiovascular: RRR, S1/S2 +, no rubs, no gallops Respiratory: CTA bilaterally, no wheezing, no rhonchi Abdominal: Soft, NT, ND, bowel sounds + Extremities: +1 pitting edema bilateral lower extremity and trace pitting edema bilateral upper extremity.  No cyanosis.    The results of significant diagnostics from this hospitalization (including imaging, microbiology, ancillary and laboratory) are listed below for reference.     Microbiology: Recent Results (from the past 240  hour(s))  SARS CORONAVIRUS 2 (TAT 6-24 HRS) Nasopharyngeal Nasopharyngeal Swab     Status: None   Collection Time: 08/22/19 11:11 PM   Specimen: Nasopharyngeal Swab  Result Value Ref Range Status   SARS Coronavirus 2 NEGATIVE NEGATIVE Final    Comment: (NOTE) SARS-CoV-2 target nucleic acids are NOT DETECTED. The SARS-CoV-2 RNA is generally detectable in upper and lower respiratory specimens during the acute phase of infection. Negative results do not preclude SARS-CoV-2 infection, do not rule out co-infections with other pathogens, and should not be used as the sole basis for treatment or other patient management decisions. Negative results must be combined with clinical observations, patient history, and epidemiological information. The expected result is Negative. Fact Sheet for Patients: SugarRoll.be Fact Sheet for Healthcare Providers: https://www.woods-mathews.com/ This test is not yet approved or cleared by the  [place] FDA and  has been authorized for detection and/or diagnosis of SARS-CoV-2 by FDA under an Emergency Use Authorization (EUA). This EUA will remain  in effect (meaning this test can be used) for the duration of the COVID-19 declaration under Section 56 4(b)(1) of the Act, 21 U.S.C. section 360bbb-3(b)(1), unless the authorization is terminated or revoked sooner. Performed at [facility], [place] [address] [place], [place] [address]      Labs: BNP (last 3 results) Recent Labs    06/05/19 1453 07/31/19 1746 08/22/19 2345  BNP 24.1 13.5 [id number]   Basic Metabolic Panel: Recent Labs  Lab 08/22/19 1626 08/22/19 2345 08/23/19 0000  NA 136 139 139  K 4.0 3.4* 3.2*  CL 98 102  --   CO2 19* 21*  --   GLUCOSE 399* 212*  --   BUN 12 11  --   CREATININE 1.04 0.79  --   CALCIUM 8.8* 8.9  --    Liver Function Tests: Recent Labs  Lab 08/22/19 2345  AST 29  ALT 30  ALKPHOS 47  BILITOT 0.6  PROT 6.6  ALBUMIN 3.6   No results for input(s): LIPASE, AMYLASE in the last 168 hours. No results for input(s): AMMONIA in the last 168 hours. CBC: Recent Labs  Lab 08/22/19 1626 08/23/19 0000  WBC 7.0  --   HGB 10.3* 10.2*  HCT 32.3* 30.0*  MCV 92.3  --   PLT 245  --    Cardiac Enzymes: No results for input(s): CKTOTAL, CKMB, CKMBINDEX, TROPONINI in the last 168 hours. BNP: Invalid input(s): POCBNP CBG: Recent Labs  Lab 08/22/19 2220 08/23/19 0138 08/23/19 0641  GLUCAP 209* 205* 220*   D-Dimer No results for input(s): DDIMER in the last 72 hours. Hgb A1c No results for input(s): HGBA1C in the last 72 hours. Lipid Profile No results for input(s): CHOL, HDL, LDLCALC, TRIG, CHOLHDL, LDLDIRECT in the last 72 hours. Thyroid function studies No results for input(s): TSH, T4TOTAL, T3FREE, THYROIDAB in the last 72 hours.  Invalid input(s): FREET3 Anemia work up No results for input(s): VITAMINB12, FOLATE, FERRITIN, TIBC, IRON, RETICCTPCT in the last 72  hours. Urinalysis    Component Value Date/Time   COLORURINE YELLOW 07/31/2019 2035   APPEARANCEUR CLEAR 07/31/2019 2035   LABSPEC 1.017 07/31/2019 2035   PHURINE 5.0 07/31/2019 2035   GLUCOSEU >=500 (A) 07/31/2019 2035   HGBUR NEGATIVE 07/31/2019 2035   BILIRUBINUR NEGATIVE 07/31/2019 2035   KETONESUR NEGATIVE 07/31/2019 2035   PROTEINUR NEGATIVE 07/31/2019 2035   UROBILINOGEN 0.2 05/27/2014 2312   NITRITE NEGATIVE 07/31/2019 2035   LEUKOCYTESUR NEGATIVE 07/31/2019 2035   Sepsis Labs  Invalid input(s): PROCALCITONIN,  WBC,  LACTICIDVEN Microbiology Recent Results (from the past 240 hour(s))  SARS CORONAVIRUS 2 (TAT 6-24 HRS) Nasopharyngeal Nasopharyngeal Swab     Status: None   Collection Time: 08/22/19 11:11 PM   Specimen: Nasopharyngeal Swab  Result Value Ref Range Status   SARS Coronavirus 2 NEGATIVE NEGATIVE Final    Comment: (NOTE) SARS-CoV-2 target nucleic acids are NOT DETECTED. The SARS-CoV-2 RNA is generally detectable in upper and lower respiratory specimens during the acute phase of infection. Negative results do not preclude SARS-CoV-2 infection, do not rule out co-infections with other pathogens, and should not be used as the sole basis for treatment or other patient management decisions. Negative results must be combined with clinical observations, patient history, and epidemiological information. The expected result is Negative. Fact Sheet for Patients: SugarRoll.be Fact Sheet for Healthcare Providers: https://www.woods-mathews.com/ This test is not yet approved or cleared by the [place] FDA and  has been authorized for detection and/or diagnosis of SARS-CoV-2 by FDA under an Emergency Use Authorization (EUA). This EUA will remain  in effect (meaning this test can be used) for the duration of the COVID-19 declaration under Section 56 4(b)(1) of the Act, 21 U.S.C. section 360bbb-3(b)(1), unless the authorization  is terminated or revoked sooner. Performed at [facility], [place] [address] [place], [place] [address]      Time coordinating discharge: Over 30 minutes  SIGNED:   Darliss Cheney, MD  [facility] 08/23/2019, 12:01 PM  If 7PM-7AM, please contact night-coverage [email or web address] Password [email or web address]

## 2019-08-23 NOTE — ED Notes (Signed)
Breakfast Ordered 

## 2019-08-23 NOTE — Discharge Instructions (Signed)
Heart Failure and Exercise °Heart failure is a condition in which the heart does not fill or pump enough blood and oxygen to support your body and its functions. Heart failure is a long-term (chronic) condition. Living with heart failure can be challenging. However, following your health care provider's instructions about a healthy lifestyle may help improve your symptoms. This includes choosing the right exercise plan. °Doing daily physical activity is important after a diagnosis of heart failure. You may have some activity restrictions, so talk to your health care provider before doing any exercises. °What are the benefits of exercise? °Exercise may: °· Make your heart muscles stronger. °· Lower your blood pressure. °· Lower your cholesterol. °· Help you lose weight. °· Help your bones stay strong. °· Improve your blood circulation. °· Help your body use oxygen better. This relieves symptoms such as fatigue and shortness of breath. °· Help your mental health by lowering the risk of depression and other problems. °· Improve your quality of life. °· Decrease your chance of hospital admission for heart failure. °What is an exercise plan? °An exercise plan is a set of specific exercises and training activities. You will work with your health care provider to create the exercise plan that works for you. The plan may include: °· Different types of exercises and how to do them. °· Cardiac rehabilitation exercises. These are supervised programs that are designed to strengthen your heart. °What are strengthening exercises? °Strengthening exercises are a type of physical activity that involves using resistance to improve your muscle strength. Strengthening exercises usually have repetitive motions. These types of exercises can include: °· Lifting weights. °· Using weight machines. °· Using resistance tubes and bands. °· Using kettlebells. °· Using your body weight, such as doing push-ups or squats. °What are balance  exercises? °Balance exercises are another type of physical activity. They strengthen the muscles of the back, stomach, and pelvis (core muscles) and improve your balance. They can also lower your risk of falling. These types of exercises can include: °· Standing on one leg. °· Walking backward, sideways, and in a straight line. °· Standing up after sitting, without using your hands. °· Shifting your weight from one leg to the other. °· Lifting one leg in front of you. °· Doing tai chi. This is a type of exercise that uses slow movements and deep breathing. °How can I increase my flexibility? °Having better flexibility can keep you from falling. It can also lengthen your muscles, improve your range of motion, and help your joints. You can increase your flexibility by: °· Doing tai chi. °· Doing yoga. °· Stretching. °How much aerobic exercise should I get? ° °Aerobic exercises strengthen your breathing and circulation system and increase your body's use of oxygen. Examples of aerobic exercise include biking, walking, running, and swimming. Talk to your health care provider to find out how much aerobic exercise is safe for you.  °To do these exercises: °· Start exercising slowly, limiting the amount of time at first. You may need to start with 5 minutes of aerobic exercise every day. °· Slowly add more minutes until you can safely do at least 30 minutes of exercise at least 4 days a week. °Summary °· Daily physical activity is important after a diagnosis of heart failure. °· Exercise can make your heart muscles stronger. It also offers other benefits that will improve your health. °· Talk to your health care provider before doing any exercises. °This information is not intended to replace advice   given to you by your health care provider. Make sure you discuss any questions you have with your health care provider. °Document Released: 02/21/2017 Document Revised: 02/24/2017 Document Reviewed: 02/21/2017 °Elsevier Patient  Education © 2020 [employer] ° °

## 2019-08-23 NOTE — ED Notes (Signed)
Admitting provider ordered that fluids be stopped.  Done.

## 2019-08-23 NOTE — ED Notes (Signed)
Tele

## 2019-09-24 ENCOUNTER — Telehealth: Payer: Self-pay | Admitting: Internal Medicine

## 2019-09-24 DIAGNOSIS — M5412 Radiculopathy, cervical region: Secondary | ICD-10-CM | POA: Insufficient documentation

## 2019-09-24 DIAGNOSIS — M7061 Trochanteric bursitis, right hip: Secondary | ICD-10-CM | POA: Insufficient documentation

## 2019-09-24 NOTE — Telephone Encounter (Signed)
New Message    Pt is calling and says he is not taking his LIPID medication anymore. He says when he went to the hospital in October they took him off of the medication. He does not remember the name    Please call

## 2019-09-24 NOTE — Telephone Encounter (Signed)
Spoke to patient- he states at one of the visits in October- he was very sick, and they took him off the [place], and since coming off of this he has been feeling better. He just wanted to let Dr.Hilty know that he was off of that medication.  Advised I would send message to MD to notify

## 2019-09-25 ENCOUNTER — Telehealth: Payer: Self-pay | Admitting: Cardiology

## 2019-09-25 NOTE — Telephone Encounter (Signed)
Thanks .. did he specifically say what his symptoms were?  Dr. Lemmie Evens

## 2019-09-25 NOTE — Telephone Encounter (Signed)
   [facility] Pre-operative Risk Assessment    Request for surgical clearance:  1. What type of surgery is being performed? Cervical spine   2. When is this surgery scheduled? TBD   3. What type of clearance is required (medical clearance vs. Pharmacy clearance to hold med vs. Both)? both  4. Are there any medications that need to be held prior to surgery and how long? Aspirin - 7 days    5. Practice name and name of physician performing surgery? Marlaine Hind, MD @ [facility]   6. What is your office phone number [phone]    7.   What is your office fax number [phone]  8.   Anesthesia type (None, local, MAC, general) ?  None/not specified    [name] 09/25/2019, 4:14 PM  _________________________________________________________________   (provider comments below)

## 2019-09-26 NOTE — Telephone Encounter (Signed)
Ok to hold asa if absolutely necessary 7 D prior to surgery and resume after.  Kirk Ruths

## 2019-09-26 NOTE — Telephone Encounter (Signed)
Dr. Stanford Breed-  Can you make recommendations on holding this patients ASA therapy for 7 days prior to cervical spine injection? Procedure date has not yet been determined. He has a hx of CAD s/p PCI 2017 with most recent cath 11/2017 with patent stents and normal LV. Also with a hx of uncontrolled DM2, mixed HLD, HTN, obesity, OSA, and DVT. You last saw the patient 02/2019 at which time he had chronic CP (no further ischemic workup recommended). He also had a syncopal episode however has had no recurrence since that time after speaking with him today. He was having hypotension possibly secondary to Vascepa therapy with two hospital admissions and was therefore taken off this with significant improvement in symptoms.   Please send your recommendations to the pre-op pool   Thank you  Sharee Pimple

## 2019-09-26 NOTE — Telephone Encounter (Signed)
   Primary Cardiologist: Kirk Ruths, MD  Chart reviewed as part of pre-operative protocol coverage. Given past medical history and time since last visit, based on ACC/AHA guidelines, [name] would be at acceptable risk for the planned procedure without further cardiovascular testing.   He was last seen by Dr. Stanford Breed 02/2019 and had stable anginal symptoms with no further ischemic workup neccessary. Last cath 11/2017 with patent stent and normal LV function. Spoke with patient today, 09/26/2019 in which he was doing well. Reported he was feeling better after being taken off Vascepa recently. Fluid volume was under control with no c/o of SOB.   Per Dr. Stanford Breed, it will be acceptable to hold the patients ASA therapy for 7 days if absolutely necessary prior to injection then resume when ok per procedural team.  I will route this recommendation to the requesting party via Epic fax function and remove from pre-op pool.  Please call with questions.  Kathyrn Drown, NP 09/26/2019, 9:02 AM

## 2019-09-27 NOTE — Telephone Encounter (Signed)
Routed to MD with updated on side effects/symptoms

## 2019-09-27 NOTE — Telephone Encounter (Signed)
LMTCB to f/up on symptoms/side effects of vascepa

## 2019-09-27 NOTE — Telephone Encounter (Signed)
Ok .. may have been anaphylaxis - agree must stop medicine.  Dr Lemmie Evens

## 2019-09-27 NOTE — Telephone Encounter (Signed)
Patient states his BP went down to 80/30 , headaches, chest pain, similar to sx of a stroke. The hospital asked if there were any new medications, and this was the only one. The hospital told him he can not take that medication any more  Patient reports once he stopped taking the medication his sx got better

## 2019-09-30 NOTE — Telephone Encounter (Signed)
Added to allergy list.  

## 2019-10-16 ENCOUNTER — Encounter (INDEPENDENT_AMBULATORY_CARE_PROVIDER_SITE_OTHER): Payer: Self-pay

## 2019-10-16 ENCOUNTER — Encounter: Payer: Self-pay | Admitting: Adult Health

## 2019-10-16 ENCOUNTER — Ambulatory Visit: Payer: Medicaid Other | Admitting: Adult Health

## 2019-10-16 ENCOUNTER — Other Ambulatory Visit: Payer: Self-pay

## 2019-10-16 VITALS — BP 104/62 | HR 85 | Ht 70.0 in | Wt 315.0 lb

## 2019-10-16 DIAGNOSIS — E66813 Obesity, class 3: Secondary | ICD-10-CM

## 2019-10-16 DIAGNOSIS — Z79899 Other long term (current) drug therapy: Secondary | ICD-10-CM | POA: Diagnosis not present

## 2019-10-16 DIAGNOSIS — E1165 Type 2 diabetes mellitus with hyperglycemia: Secondary | ICD-10-CM | POA: Diagnosis not present

## 2019-10-16 DIAGNOSIS — G4733 Obstructive sleep apnea (adult) (pediatric): Secondary | ICD-10-CM

## 2019-10-16 DIAGNOSIS — E785 Hyperlipidemia, unspecified: Secondary | ICD-10-CM

## 2019-10-16 DIAGNOSIS — I10 Essential (primary) hypertension: Secondary | ICD-10-CM | POA: Diagnosis not present

## 2019-10-16 DIAGNOSIS — G47429 Narcolepsy in conditions classified elsewhere without cataplexy: Secondary | ICD-10-CM

## 2019-10-16 MED ORDER — ISOSORBIDE MONONITRATE ER 30 MG PO TB24: 30.0000 mg | ORAL_TABLET | Freq: Every day | ORAL | 3 refills | Status: DC

## 2019-10-16 NOTE — Progress Notes (Signed)
Cardiology Office Note   Date:  10/16/2019   ID:  [name], DOB Jul 26, 1961, MRN [id number]  PCP:  [name], FNP  Cardiologist: Dr.Crenshaw/ Dr. Debara Pickett (lipids). No chief complaint on file.    History of Present Illness: [name] is a 58 y.o. male who presents for ongoing assessment and management of CAD with cardiac cath in 2015 revealing moderate LAD disease with repeat cath in 2017 with PCI and DES to the LAD., chronic diastolic CHF, hypertension, carotid artery disease.  Other history to include IDDM, pancreatitis, morbid obesity, and hyperlipidemia.  He was last seen by Dr. Debara Pickett on 04/24/2019 for referral for elevated TG. He was found to be a candidate for Vascepa 2 gm BID. He is to have repeat lipid studies this office visit.   He comes today with no complaints. He has stopped using his CPAP because the mask is uncomfortable. He is also taking pain (opiod) medications. He falls asleep easily during my conversation with him today.  He states his breathing has been an issue. He could not bend over yesterday during spine injection, and this procedure was aborted.   Past Medical History:  Diagnosis Date  . Anemia   . Arthritis   . Back pain   . CAD (coronary artery disease)    a. s/p DES to LAD in 05/2016  . Cervical radiculopathy   . Chronic diastolic CHF (congestive heart failure) ([place])   . Chronic pain   . Depression   . DVT (deep venous thrombosis) ([place])   . Hematemesis   . Hepatic steatosis   . Hyperlipidemia   . Hypertension   . IBS (irritable bowel syndrome)   . Morbid obesity ([place])   . OSA (obstructive sleep apnea)   . Pancreatitis   . PE (pulmonary thromboembolism) ([place])   . Peripheral neuropathy   . PUD (peptic ulcer disease)   . Renal disorder   . Stroke [facility])    a. ?details unclear - not seen on imaging when he was admitted in 05/2017 for TIA symptoms which were felt due to cervical radiculopathy.  . Thoracic aortic ectasia (H[facility])    a.  4.3cm ectatic ascending thoracic aorta by CT 06/2017.   . Type 2 diabetes mellitus ([place])     Past Surgical History:  Procedure Laterality Date  . CARDIAC CATHETERIZATION N/A 05/31/2016   Procedure: Left Heart Cath and Coronary Angiography;  Surgeon: Peter M Martinique, MD;  Location: [place] [facility];  Service: Cardiovascular;  Laterality: N/A;  . CARDIAC CATHETERIZATION N/A 05/31/2016   Procedure: Intravascular Pressure Wire/FFR Study;  Surgeon: Peter M Martinique, MD;  Location: [place] [facility];  Service: Cardiovascular;  Laterality: N/A;  . CARDIAC CATHETERIZATION N/A 05/31/2016   Procedure: Coronary Stent Intervention;  Surgeon: Peter M Martinique, MD;  Location: [place] [facility];  Service: Cardiovascular;  Laterality: N/A;  . LEFT HEART CATH AND CORONARY ANGIOGRAPHY N/A 12/08/2017   Procedure: LEFT HEART CATH AND CORONARY ANGIOGRAPHY;  Surgeon: Leonie Man, MD;  Location: [place] [facility];  Service: Cardiovascular;  Laterality: N/A;  . LEFT HEART CATHETERIZATION WITH CORONARY ANGIOGRAM N/A 02/03/2014   Procedure: LEFT HEART CATHETERIZATION WITH CORONARY ANGIOGRAM;  Surgeon: Pixie Casino, MD;  Location: [facility] CATH LAB;  Service: Cardiovascular;  Laterality: N/A;  . left leg stent        Current Outpatient Medications  Medication Sig Dispense Refill  . Accu-Chek FastClix Lancets MISC 1 Device by Other route 2 (two) times  daily.     . Albuterol Sulfate (PROAIR RESPICLICK) [id number] (90 Base) MCG/ACT AEPB Inhale 2 puffs into the lungs every 6 (six) hours as needed. (Patient taking differently: Inhale 2 puffs into the lungs every 6 (six) hours as needed (for breathing). ) 1 each 0  . aspirin 81 MG chewable tablet Chew 1 tablet (81 mg total) by mouth daily. 30 tablet 0  . atorvastatin (LIPITOR) 80 MG tablet Take 1 tablet (80 mg total) by mouth daily. 90 tablet 3  . carvedilol (COREG) 25 MG tablet Take 1 tablet (25 mg total) by mouth 2 (two) times daily with a meal. 180 tablet 3  . DULoxetine (CYMBALTA)  60 MG capsule TAKE 1 CAPSULE BY MOUTH EVERY DAY (Patient taking differently: Take 60 mg by mouth daily. ) 90 capsule 3  . furosemide (LASIX) 40 MG tablet Take 1 tablet (40 mg total) by mouth 3 (three) times daily. 90 tablet 0  . glipiZIDE (GLUCOTROL) 10 MG tablet Take 10 mg by mouth 2 (two) times daily.  4  . Insulin Degludec 200 UNIT/ML SOPN Inject 100 Units as directed at bedtime.    . Insulin Pen Needle 31G X 5 MM MISC Use 1 needle daily to inject insulin as prescribed 100 each 2  . isosorbide mononitrate (IMDUR) 30 MG 24 hr tablet Take 1 tablet (30 mg total) by mouth daily. 90 tablet 3  . nitroGLYCERIN (NITROSTAT) 0.4 MG SL tablet Place 1 tablet (0.4 mg total) under the tongue every 5 (five) minutes as needed for chest pain. 100 tablet 1  . NOVOLOG FLEXPEN 100 UNIT/ML FlexPen Inject 16-22 Units into the skin 3 (three) times daily with meals. Per sliding scale    . nystatin (MYCOSTATIN) 100000 UNIT/ML suspension Take 5 mLs (500,000 Units total) by mouth 4 (four) times daily. 60 mL 0  . oxyCODONE (ROXICODONE) 15 MG immediate release tablet Take 15 mg 3 (three) times daily as needed by mouth for pain.   0  . pantoprazole (PROTONIX) 40 MG tablet Take 40 mg by mouth daily.    . pregabalin (LYRICA) 300 MG capsule Take 300 mg by mouth 2 (two) times daily.    . sertraline (ZOLOFT) 50 MG tablet Take 50 mg by mouth daily.    Marland Kitchen tiZANidine (ZANAFLEX) 2 MG tablet Take 2 mg by mouth 2 (two) times daily as needed for pain.    . traZODone (DESYREL) 50 MG tablet Take 1 tablet (50 mg total) by mouth at bedtime. 30 tablet 11  . Vitamin D, Ergocalciferol, (DRISDOL) 1.25 MG (50000 UT) CAPS capsule Take 50,000 Units by mouth once a week. Wednesdays    . XTAMPZA ER 27 MG C12A Take 27 mg by mouth 2 (two) times daily.     No current facility-administered medications for this visit.    Allergies:   Coconut flavor [flavoring agent], Coconut oil, Ibuprofen, Aleve [naproxen], Nsaids, and Vascepa [icosapent ethyl]     Social History:  The patient  reports that he has never smoked. He has never used smokeless tobacco. He reports that he does not drink alcohol or use drugs.   Family History:  The patient's family history includes Breast cancer in his mother; Cancer in his father; Diabetes in his brother, mother, and sister; Hypertension in his brother, mother, and sister.    ROS: All other systems are reviewed and negative. Unless otherwise mentioned in H&P    PHYSICAL EXAM: VS:  BP 104/62   Pulse 85   Ht 5\' 10"  (1.778  m)   Wt (!) 315 lb (142.9 kg)   SpO2 95%   BMI 45.20 kg/m  , BMI Body mass index is 45.2 kg/m. GEN: Well nourished, well developed, in no acute distress. Morbidly obese HEENT: normal Neck: no JVD, carotid bruits, or masses Cardiac: RRR; no murmurs, rubs, or gallops,no edema  Respiratory:  Clear to auscultation bilaterally, normal work of breathing GI: soft, nontender, nondistended, + BS MS: no deformity or atrophy Skin: warm and dry, no rash Neuro:  Strength and sensation are intact. Very somnolent, groggy, falls asleep easily.   Psych: euthymic mood, full affect   EKG: NSR rate of 80 bpm.  Recent Labs: 08/22/2019: ALT 30; B Natriuretic Peptide 58.1; BUN 11; Creatinine, Ser 0.79 08/23/2019: Hemoglobin 10.3; Magnesium 1.5; Platelets 238; Potassium 3.2; Sodium 139    Lipid Panel    Component Value Date/Time   CHOL 152 03/08/2019 1008   TRIG 599 (H) 03/08/2019 1008   HDL 35 (L) 03/08/2019 1008   CHOLHDL 4.3 03/08/2019 1008   VLDL 43 (H) 12/08/2017 0212   LDLCALC  03/08/2019 1008     Comment:     . LDL cholesterol not calculated. Triglyceride levels greater than 400 mg/dL invalidate calculated LDL results. . Reference range: <100 . Desirable range <100 mg/dL for primary prevention;   <70 mg/dL for patients with CHD or diabetic patients  with > or = 2 CHD risk factors. Marland Kitchen LDL-C is now calculated using the Martin-Hopkins  calculation, which is a validated novel  method providing  better accuracy than the Friedewald equation in the  estimation of LDL-C.  Cresenciano Genre et al. Annamaria Helling. [id number]): 2061-2068  (http://education.[employer].com/faq/FAQ164)       Wt Readings from Last 3 Encounters:  10/16/19 (!) 315 lb (142.9 kg)  08/23/19 (!) 324 lb 1.6 oz (147 kg)  08/04/19 (!) 314 lb 4.8 oz (142.6 kg)      Other studies Reviewed: Echocardiogram 05/07/19 1. The left ventricle has normal systolic function with an ejection fraction of 60-65%. The cavity size was normal. There is mildly increased left ventricular wall thickness. Left ventricular diastolic Doppler parameters are consistent with impaired  relaxation.  2. The right ventricle has normal systolic function. The cavity was normal.  3. There is mild dilatation of the ascending aorta measuring 38 mm.  4. The aortic valve is tricuspid.  5. Normal LV systolic function; mild diastolic dysfunction; mild LVH.  6. The mitral valve is grossly normal.  ASSESSMENT AND PLAN:  1. Narcolepsy symptoms: Difficult to keep awake in the clinic today during assessment. Checked blood glucose 229.  O2 Sat 92-95%.  I was very uncomfortable allowing him to drive home  We did not have Narcan, so we used Ammonia capsules. This awakened him enough to be responsive.   This may be related to OSA not using his CPAP and opioid use. He was advised not to drive when he takes oxycodone. Once he was fully awake, he was allowed to leave the office. He answered questions appropriately, able to walk and was fully functional.   2. Hyperlipidemia: He did not tolerate Vescepa as this made him sick, so he stopped taking it. I will have fasting lipids and LFT's, along with BMET in the am.   3. IDDM: BG today was 229.  Somnolence was unlikely related to hypoglycemia. Follow up with PCP  4. Chronic Back Pain: On several medications for pain, including oxycodone. This may have influenced his level of consciousness, and grogginess.    5.  OSA:  Not compliant with CPAP. I offered to have our sleep clinic re-evaluate him and possibly get another mask that fit him better. He refused this referral.      Current medicines are reviewed at length with the patient today.  I have spent > 45 minutes with this patient in assessment, treatment due to difficulty in keeping aroused. Treated with ammonia capsule   Labs/ tests ordered today include: Fasting lipids and LFT's. BMET., Hgb A1c.  Phill Myron. West Pugh, ANP, AACC   10/16/2019 2:21 PM    [facility] [address] Office [phone] Fax [phone]  Notice: This dictation was prepared with Dragon dictation along with smaller phrase technology. Any transcriptional errors that result from this process are unintentional and may not be corrected upon review.

## 2019-10-16 NOTE — Patient Instructions (Addendum)
Medication Instructions:  DECREASE- Isosorbide(Imdur) 30 mg by mouth daily  If you need a refill on your cardiac medications before your next appointment, please call your pharmacy.  Labwork: CMP, Lipid and Liver, HgB A1C HERE IN OUR OFFICE AT [facility]   You will need to fast. DO NOT EAT OR DRINK PAST MIDNIGHT.     If you have labs (blood work) drawn today and your tests are completely normal, you will receive your results only by: Marland Kitchen MyChart Message (if you have MyChart) OR . A paper copy in the mail If you have any lab test that is abnormal or we need to change your treatment, we will call you to review the results.  Testing/Procedures: None Ordered  PLEASE READ AND FOLLOW SALTY 6 ATTACHED  Reduce your risk of getting COVID-19 With your heart disease it is especially important for people at increased risk of severe illness from COVID-19, and those who live with them, to protect themselves from getting COVID-19. The best way to protect yourself and to help reduce the spread of the virus that causes COVID-19 is to: Marland Kitchen Limit your interactions with other people as much as possible. . Take precautions to prevent getting COVID-19 when you do interact with others. If you start feeling sick and think you may have COVID-19, get in touch with your healthcare provider within 24 hours.  Follow-Up: IN 3 months Please call our office 2 months in advance,  to schedule this appointment. In Person Kirk Ruths, MD.    At [facility], you and your health needs are our priority.  As part of our continuing mission to provide you with exceptional heart care, we have created designated Provider Care Teams.  These Care Teams include your primary Cardiologist (physician) and Advanced Practice Providers (APPs -  Physician Assistants and Nurse Practitioners) who all work together to provide you with the care you need, when you need it.  Thank you for choosing [facility] at [facility]!!     Happy  Holidays!!

## 2019-10-18 LAB — COMPREHENSIVE METABOLIC PANEL
ALT: 19 IU/L (ref 0–44)
AST: 16 IU/L (ref 0–40)
Albumin/Globulin Ratio: 1.5 (ref 1.2–2.2)
Albumin: 4 g/dL (ref 3.8–4.9)
Alkaline Phosphatase: 68 IU/L (ref 39–117)
BUN/Creatinine Ratio: 19 (ref 9–20)
BUN: 13 mg/dL (ref 6–24)
Bilirubin Total: 0.5 mg/dL (ref 0.0–1.2)
CO2: 22 mmol/L (ref 20–29)
Calcium: 9.2 mg/dL (ref 8.7–10.2)
Chloride: 98 mmol/L (ref 96–106)
Creatinine, Ser: 0.68 mg/dL — ABNORMAL LOW (ref 0.76–1.27)
GFR calc Af Amer: 123 mL/min/{1.73_m2} (ref >59)
GFR calc non Af Amer: 107 mL/min/{1.73_m2} (ref >59)
Globulin, Total: 2.7 g/dL (ref 1.5–4.5)
Glucose: 253 mg/dL — ABNORMAL HIGH (ref 65–99)
Potassium: 3.9 mmol/L (ref 3.5–5.2)
Sodium: 139 mmol/L (ref 134–144)
Total Protein: 6.7 g/dL (ref 6.0–8.5)

## 2019-10-18 LAB — HEPATIC FUNCTION PANEL: Bilirubin, Direct: 0.15 mg/dL (ref 0.00–0.40)

## 2019-10-18 LAB — HEMOGLOBIN A1C
Est. average glucose Bld gHb Est-mCnc: 286 mg/dL
Hgb A1c MFr Bld: 11.6 % — ABNORMAL HIGH (ref 4.8–5.6)

## 2019-10-18 LAB — LIPID PANEL
Chol/HDL Ratio: 4.8 ratio (ref 0.0–5.0)
Cholesterol, Total: 163 mg/dL (ref 100–199)
HDL: 34 mg/dL — ABNORMAL LOW (ref >39)
LDL Chol Calc (NIH): 51 mg/dL (ref 0–99)
Triglycerides: 529 mg/dL — ABNORMAL HIGH (ref 0–149)
VLDL Cholesterol Cal: 78 mg/dL — ABNORMAL HIGH (ref 5–40)

## 2019-10-22 NOTE — Addendum Note (Signed)
Addended by: Venetia Maxon on: 10/22/2019 09:54 AM   Modules accepted: Orders

## 2019-10-30 DIAGNOSIS — M12819 Other specific arthropathies, not elsewhere classified, unspecified shoulder: Secondary | ICD-10-CM | POA: Insufficient documentation

## 2019-11-04 ENCOUNTER — Encounter: Payer: Self-pay | Admitting: Internal Medicine

## 2019-11-04 ENCOUNTER — Ambulatory Visit: Payer: Medicaid Other | Admitting: Internal Medicine

## 2019-11-04 ENCOUNTER — Other Ambulatory Visit: Payer: Self-pay

## 2019-11-04 VITALS — BP 128/60 | HR 85 | Temp 97.7°F | Ht 70.0 in | Wt 318.0 lb

## 2019-11-04 DIAGNOSIS — E785 Hyperlipidemia, unspecified: Secondary | ICD-10-CM

## 2019-11-04 DIAGNOSIS — E1165 Type 2 diabetes mellitus with hyperglycemia: Secondary | ICD-10-CM | POA: Diagnosis not present

## 2019-11-04 DIAGNOSIS — I2510 Atherosclerotic heart disease of native coronary artery without angina pectoris: Secondary | ICD-10-CM

## 2019-11-04 MED ORDER — FENOFIBRATE 145 MG PO TABS: 145.0000 mg | ORAL_TABLET | Freq: Every day | ORAL | 3 refills | Status: DC

## 2019-11-04 NOTE — Patient Instructions (Signed)
Medication Instructions:  BEGIN TAKING FENOFIBRATE 145MG  DAILY=1 TABLET DAILY *If you need a refill on your cardiac medications before your next appointment, please call your pharmacy*  Lab Work: LABS TO BE COMPLETED IN 3 MONTHS PRIOR TO VISIT WITH DR.HILTY. If you have labs (blood work) drawn today and your tests are completely normal, you will receive your results only by: Marland Kitchen MyChart Message (if you have MyChart) OR . A paper copy in the mail If you have any lab test that is abnormal or we need to change your treatment, we will call you to review the results.   Follow-Up: At [facility], you and your health needs are our priority.  As part of our continuing mission to provide you with exceptional heart care, we have created designated Provider Care Teams.  These Care Teams include your primary Cardiologist (physician) and Advanced Practice Providers (APPs -  Physician Assistants and Nurse Practitioners) who all work together to provide you with the care you need, when you need it.  Your next appointment:   3 month(s)  The format for your next appointment:   Either In Person or Virtual  Provider:   Raliegh Ip Mali Hilty, MD

## 2019-11-04 NOTE — Progress Notes (Signed)
LIPID OFFICE NOTE  Chief Complaint:  Follow-up  Primary Care Physician: [name], FNP  Primary Cardiologist:  Kirk Ruths, MD  HPI:  [name] is a 59 y.o. male who is being seen today for the evaluation of hypertriglyceridemia at the request of [name], FNP. [name] is a pleasant 59 year old male patient who I previously took care of back in 2015.  In fact he underwent cardiac catheterization by myself which showed just moderate LAD disease at the time.  He had been managed medically for this.  Other comorbidities include morbid obesity, insulin-dependent diabetes with recently worse blood sugar controlled hemoglobin A1c 9.8.  He has dyslipidemia with persistently elevated triglycerides which has ranged between 202 and 250 on treatment with atorvastatin 80 mg, however recently a repeat lipid profile showed markdly elevated triglycerides near 600.  Over the past year he is suffered from chronic pain and is received steroids, he also has a history of pancreatitis in the past.  Is not clear whether this was related to elevated triglycerides or not.  He ultimately had progression of coronary disease and received a stent to the LAD in August 2017 and transition care over to Dr. Stanford Breed.  He is currently referred today for evaluation and management of elevated triglycerides.   08/24/2020  [name] is seen today in follow-up.  Over the summer he was hospitalized for DKA/hyperglycemia.  He was hypotensive and he was concerned about possible anaphylaxis as he had just started Vascepa.  He also mentioned that he thought that it increased his blood sugar however there is little evidence of this.  According to the hospitalist physician Dr. Broadus John, the medication was held pending follow-up with me.  Subsequently had been admitted several times with hyperglycemia, infection and other issues including chest pain.  Hemoglobin A1c is 0 poorly controlled at 11.6 despite being on  insulin at this point.  Triglycerides again were rechecked at 529.  PMHx:  Past Medical History:  Diagnosis Date  . Anemia   . Arthritis   . Back pain   . CAD (coronary artery disease)    a. s/p DES to LAD in 05/2016  . Cervical radiculopathy   . Chronic diastolic CHF (congestive heart failure) ([place])   . Chronic pain   . Depression   . DVT (deep venous thrombosis) ([place])   . Hematemesis   . Hepatic steatosis   . Hyperlipidemia   . Hypertension   . IBS (irritable bowel syndrome)   . Morbid obesity ([place])   . OSA (obstructive sleep apnea)   . Pancreatitis   . PE (pulmonary thromboembolism) ([place])   . Peripheral neuropathy   . PUD (peptic ulcer disease)   . Renal disorder   . Stroke [facility])    a. ?details unclear - not seen on imaging when he was admitted in 05/2017 for TIA symptoms which were felt due to cervical radiculopathy.  . Thoracic aortic ectasia (HCC)    a. 4.3cm ectatic ascending thoracic aorta by CT 06/2017.   . Type 2 diabetes mellitus ([place])     Past Surgical History:  Procedure Laterality Date  . CARDIAC CATHETERIZATION N/A 05/31/2016   Procedure: Left Heart Cath and Coronary Angiography;  Surgeon: Peter M Martinique, MD;  Location: [place] CV [facility];  Service: Cardiovascular;  Laterality: N/A;  . CARDIAC CATHETERIZATION N/A 05/31/2016   Procedure: Intravascular Pressure Wire/FFR Study;  Surgeon: Peter M Martinique, MD;  Location: [place] CV LAB;  Service: Cardiovascular;  Laterality: N/A;  . CARDIAC CATHETERIZATION N/A 05/31/2016   Procedure: Coronary Stent Intervention;  Surgeon: Peter M Martinique, MD;  Location: [place] CV LAB;  Service: Cardiovascular;  Laterality: N/A;  . LEFT HEART CATH AND CORONARY ANGIOGRAPHY N/A 12/08/2017   Procedure: LEFT HEART CATH AND CORONARY ANGIOGRAPHY;  Surgeon: Leonie Man, MD;  Location: [place] CV LAB;  Service: Cardiovascular;  Laterality: N/A;  . LEFT HEART CATHETERIZATION WITH CORONARY ANGIOGRAM N/A 02/03/2014   Procedure: LEFT  HEART CATHETERIZATION WITH CORONARY ANGIOGRAM;  Surgeon: Pixie Casino, MD;  Location: [facility] C[facility] LAB;  Service: Cardiovascular;  Laterality: N/A;  . left leg stent       FAMHx:  Family History  Problem Relation Age of Onset  . Cancer Father   . Hypertension Mother   . Diabetes Mother   . Breast cancer Mother   . Hypertension Brother   . Diabetes Brother   . Hypertension Sister   . Diabetes Sister     SOCHx:   reports that he has never smoked. He has never used smokeless tobacco. He reports that he does not drink alcohol or use drugs.  ALLERGIES:  Allergies  Allergen Reactions  . Coconut Flavor [Flavoring Agent] Hives  . Coconut Oil Hives  . Ibuprofen Other (See Comments)    Made gastric ulcers worse  . Aleve [Naproxen] Other (See Comments)    DUE TO KIDNEYS  . Nsaids Other (See Comments)    Stomach ulcers   . Vascepa [Icosapent Ethyl] Other (See Comments)    headaches, chest pain, similar to sx of a stroke, hypotension     ROS: Pertinent items noted in HPI and remainder of comprehensive ROS otherwise negative.  HOME MEDS: Current Outpatient Medications on File Prior to Visit  Medication Sig Dispense Refill  . Accu-Chek FastClix Lancets MISC 1 Device by Other route 2 (two) times daily.     . Albuterol Sulfate (PROAIR RESPICLICK) 123XX123 (90 Base) MCG/ACT AEPB Inhale 2 puffs into the lungs every 6 (six) hours as needed. (Patient taking differently: Inhale 2 puffs into the lungs every 6 (six) hours as needed (for breathing). ) 1 each 0  . aspirin 81 MG chewable tablet Chew 1 tablet (81 mg total) by mouth daily. 30 tablet 0  . atorvastatin (LIPITOR) 80 MG tablet Take 1 tablet (80 mg total) by mouth daily. 90 tablet 3  . carvedilol (COREG) 25 MG tablet Take 1 tablet (25 mg total) by mouth 2 (two) times daily with a meal. 180 tablet 3  . Dulaglutide (TRULICITY [place]) Inject 123456 Units into the skin daily.    . DULoxetine (CYMBALTA) 60 MG capsule TAKE 1 CAPSULE BY MOUTH EVERY DAY  (Patient taking differently: Take 60 mg by mouth daily. ) 90 capsule 3  . furosemide (LASIX) 40 MG tablet Take 1 tablet (40 mg total) by mouth 3 (three) times daily. 90 tablet 0  . glipiZIDE (GLUCOTROL) 10 MG tablet Take 10 mg by mouth 2 (two) times daily.  4  . Insulin Degludec 200 UNIT/ML SOPN Inject 100 Units as directed at bedtime.    . Insulin Pen Needle 31G X 5 MM MISC Use 1 needle daily to inject insulin as prescribed 100 each 2  . isosorbide mononitrate (IMDUR) 30 MG 24 hr tablet Take 1 tablet (30 mg total) by mouth daily. 90 tablet 3  . nitroGLYCERIN (NITROSTAT) 0.4 MG SL tablet Place 1 tablet (0.4 mg total) under the tongue every 5 (five) minutes as needed for chest  pain. 100 tablet 1  . NOVOLOG FLEXPEN 100 UNIT/ML FlexPen Inject 16-22 Units into the skin 3 (three) times daily with meals. Per sliding scale    . nystatin (MYCOSTATIN) 100000 UNIT/ML suspension Take 5 mLs (500,000 Units total) by mouth 4 (four) times daily. 60 mL 0  . oxyCODONE (ROXICODONE) 15 MG immediate release tablet Take 15 mg 3 (three) times daily as needed by mouth for pain.   0  . pantoprazole (PROTONIX) 40 MG tablet Take 40 mg by mouth daily.    . pregabalin (LYRICA) 300 MG capsule Take 300 mg by mouth 2 (two) times daily.    . sertraline (ZOLOFT) 50 MG tablet Take 50 mg by mouth daily.    Marland Kitchen tiZANidine (ZANAFLEX) 2 MG tablet Take 2 mg by mouth 2 (two) times daily as needed for pain.    . traZODone (DESYREL) 50 MG tablet Take 1 tablet (50 mg total) by mouth at bedtime. 30 tablet 11  . Vitamin D, Ergocalciferol, (DRISDOL) 1.25 MG (50000 UT) CAPS capsule Take 50,000 Units by mouth once a week. Wednesdays    . XTAMPZA ER 27 MG C12A Take 27 mg by mouth 2 (two) times daily.     No current facility-administered medications on file prior to visit.    LABS/IMAGING: No results found for this or any previous visit (from the past 48 hour(s)). No results found.  LIPID PANEL:    Component Value Date/Time   CHOL 163  10/17/2019 0836   TRIG 529 (H) 10/17/2019 0836   HDL 34 (L) 10/17/2019 0836   CHOLHDL 4.8 10/17/2019 0836   CHOLHDL 4.3 03/08/2019 1008   VLDL 43 (H) 12/08/2017 0212   LDLCALC 51 10/17/2019 0836   LDLCALC  03/08/2019 1008     Comment:     . LDL cholesterol not calculated. Triglyceride levels greater than 400 mg/dL invalidate calculated LDL results. . Reference range: <100 . Desirable range <100 mg/dL for primary prevention;   <70 mg/dL for patients with CHD or diabetic patients  with > or = 2 CHD risk factors. Marland Kitchen LDL-C is now calculated using the Martin-Hopkins  calculation, which is a validated novel method providing  better accuracy than the Friedewald equation in the  estimation of LDL-C.  Cresenciano Genre et al. Annamaria Helling. [id number]): 2061-2068  (http://education.[employer].com/faq/FAQ164)     WEIGHTS: Wt Readings from Last 3 Encounters:  11/04/19 (!) 318 lb (144.2 kg)  10/16/19 (!) 315 lb (142.9 kg)  08/23/19 (!) 324 lb 1.6 oz (147 kg)    VITALS: BP 128/60 (BP Location: Left Arm, Patient Position: Sitting, Cuff Size: Large)   Pulse 85   Temp 97.7 F (36.5 C)   Ht 5\' 10"  ([id number] m)   Wt (!) 318 lb (144.2 kg)   BMI 45.63 kg/m   EXAM: Deferred  EKG: Deferred  ASSESSMENT: 1. Coronary artery disease status post PCI to the LAD (2017) 2. Insulin-dependent diabetes-uncontrolled, hemoglobin A1c 11.6 3. Morbid obesity with recent weight loss 4. Hypertension 5. Mixed dyslipidemia with very high triglycerides 6. Intermittent prescription steroid use 7. History of pancreatitis  PLAN: 1.   [name] has poorly controlled insulin-dependent diabetes with multiple admissions for DKA, hypotension and infection.  I question whether or not he actually had an anaphylactic reaction to the Vascepa or that it was causing him worsening hyperglycemia.  For now however we will continue off of it and I recommend starting fenofibrate 145 mg daily.  We will repeat lipids in about 3  months.  He  needs further intensification of his glycemic control through his primary provider as his fasting blood glucoses are still in the 200s.  Plan follow-up with me in 3 months.  Pixie Casino, MD, [facility], [place] Director of the [facility] Diplomate of the American Board of Clinical Lipidology Attending Cardiologist  Direct Dial: [phone]  Fax: [phone]  Website:  [email or web address]  11/04/2019, 9:30 AM

## 2019-11-13 ENCOUNTER — Ambulatory Visit (HOSPITAL_COMMUNITY)
Admission: EM | Admit: 2019-11-13 | Discharge: 2019-11-13 | Disposition: A | Discharge status: Home / Self Care | Payer: Medicaid Other | Attending: Urgent Care | Admitting: Urgent Care

## 2019-11-13 ENCOUNTER — Other Ambulatory Visit: Payer: Self-pay

## 2019-11-13 ENCOUNTER — Encounter (HOSPITAL_COMMUNITY): Payer: Self-pay

## 2019-11-13 DIAGNOSIS — H6121 Impacted cerumen, right ear: Secondary | ICD-10-CM

## 2019-11-13 DIAGNOSIS — H9191 Unspecified hearing loss, right ear: Secondary | ICD-10-CM

## 2019-11-13 NOTE — ED Triage Notes (Signed)
Pt states his right ear is clogged up. His ear has been like that for about 10 days.

## 2019-11-13 NOTE — ED Provider Notes (Addendum)
[name]   MRN: [id number] DOB: [birth year]  Subjective:   [name] is a 59 y.o. male presenting for 10-day history of right ear fullness.  Patient states that he tried to use peroxide with minimal relief.  He has a history of earwax buildup.  Denies fever, tinnitus, dizziness, ear drainage, sinus pain, throat pain, cough.  No current facility-administered medications for this encounter.  Current Outpatient Medications:  .  Accu-Chek FastClix Lancets MISC, 1 Device by Other route 2 (two) times daily. , Disp: , Rfl:  .  Albuterol Sulfate (PROAIR RESPICLICK) 123XX123 (90 Base) MCG/ACT AEPB, Inhale 2 puffs into the lungs every 6 (six) hours as needed. (Patient taking differently: Inhale 2 puffs into the lungs every 6 (six) hours as needed (for breathing). ), Disp: 1 each, Rfl: 0 .  aspirin 81 MG chewable tablet, Chew 1 tablet (81 mg total) by mouth daily., Disp: 30 tablet, Rfl: 0 .  atorvastatin (LIPITOR) 80 MG tablet, Take 1 tablet (80 mg total) by mouth daily., Disp: 90 tablet, Rfl: 3 .  carvedilol (COREG) 25 MG tablet, Take 1 tablet (25 mg total) by mouth 2 (two) times daily with a meal., Disp: 180 tablet, Rfl: 3 .  Dulaglutide (TRULICITY [place]), Inject 123456 Units into the skin daily., Disp: , Rfl:  .  DULoxetine (CYMBALTA) 60 MG capsule, TAKE 1 CAPSULE BY MOUTH EVERY DAY (Patient taking differently: Take 60 mg by mouth daily. ), Disp: 90 capsule, Rfl: 3 .  fenofibrate (TRICOR) 145 MG tablet, Take 1 tablet (145 mg total) by mouth daily., Disp: 90 tablet, Rfl: 3 .  furosemide (LASIX) 40 MG tablet, Take 1 tablet (40 mg total) by mouth 3 (three) times daily., Disp: 90 tablet, Rfl: 0 .  glipiZIDE (GLUCOTROL) 10 MG tablet, Take 10 mg by mouth 2 (two) times daily., Disp: , Rfl: 4 .  Insulin Degludec 200 UNIT/ML SOPN, Inject 100 Units as directed at bedtime., Disp: , Rfl:  .  Insulin Pen Needle 31G X 5 MM MISC, Use 1 needle daily to inject insulin as prescribed, Disp: 100 each, Rfl: 2 .   isosorbide mononitrate (IMDUR) 30 MG 24 hr tablet, Take 1 tablet (30 mg total) by mouth daily., Disp: 90 tablet, Rfl: 3 .  nitroGLYCERIN (NITROSTAT) 0.4 MG SL tablet, Place 1 tablet (0.4 mg total) under the tongue every 5 (five) minutes as needed for chest pain., Disp: 100 tablet, Rfl: 1 .  NOVOLOG FLEXPEN 100 UNIT/ML FlexPen, Inject 16-22 Units into the skin 3 (three) times daily with meals. Per sliding scale, Disp: , Rfl:  .  nystatin (MYCOSTATIN) 100000 UNIT/ML suspension, Take 5 mLs (500,000 Units total) by mouth 4 (four) times daily., Disp: 60 mL, Rfl: 0 .  oxyCODONE (ROXICODONE) 15 MG immediate release tablet, Take 15 mg 3 (three) times daily as needed by mouth for pain. , Disp: , Rfl: 0 .  pantoprazole (PROTONIX) 40 MG tablet, Take 40 mg by mouth daily., Disp: , Rfl:  .  pregabalin (LYRICA) 300 MG capsule, Take 300 mg by mouth 2 (two) times daily., Disp: , Rfl:  .  sertraline (ZOLOFT) 50 MG tablet, Take 50 mg by mouth daily., Disp: , Rfl:  .  tiZANidine (ZANAFLEX) 2 MG tablet, Take 2 mg by mouth 2 (two) times daily as needed for pain., Disp: , Rfl:  .  traZODone (DESYREL) 50 MG tablet, Take 1 tablet (50 mg total) by mouth at bedtime., Disp: 30 tablet, Rfl: 11 .  Vitamin D, Ergocalciferol, (DRISDOL)  1.25 MG (50000 UT) CAPS capsule, Take 50,000 Units by mouth once a week. Wednesdays, Disp: , Rfl:  .  XTAMPZA ER 27 MG C12A, Take 27 mg by mouth 2 (two) times daily., Disp: , Rfl:    Allergies  Allergen Reactions  . Coconut Flavor [Flavoring Agent] Hives  . Coconut Oil Hives  . Ibuprofen Other (See Comments)    Made gastric ulcers worse  . Aleve [Naproxen] Other (See Comments)    DUE TO KIDNEYS  . Nsaids Other (See Comments)    Stomach ulcers   . Vascepa [Icosapent Ethyl] Other (See Comments)    headaches, chest pain, similar to sx of a stroke, hypotension     Past Medical History:  Diagnosis Date  . Anemia   . Arthritis   . Back pain   . CAD (coronary artery disease)    a. s/p DES  to LAD in 05/2016  . Cervical radiculopathy   . Chronic diastolic CHF (congestive heart failure) ([place])   . Chronic pain   . Depression   . DVT (deep venous thrombosis) ([place])   . Hematemesis   . Hepatic steatosis   . Hyperlipidemia   . Hypertension   . IBS (irritable bowel syndrome)   . Morbid obesity ([place])   . OSA (obstructive sleep apnea)   . Pancreatitis   . PE (pulmonary thromboembolism) ([place])   . Peripheral neuropathy   . PUD (peptic ulcer disease)   . Renal disorder   . Stroke [facility])    a. ?details unclear - not seen on imaging when he was admitted in 05/2017 for TIA symptoms which were felt due to cervical radiculopathy.  . Thoracic aortic ectasia (HCC)    a. 4.3cm ectatic ascending thoracic aorta by CT 06/2017.   . Type 2 diabetes mellitus ([place])      Past Surgical History:  Procedure Laterality Date  . CARDIAC CATHETERIZATION N/A 05/31/2016   Procedure: Left Heart Cath and Coronary Angiography;  Surgeon: Peter M Martinique, MD;  Location: [place] [facility] LAB;  Service: Cardiovascular;  Laterality: N/A;  . CARDIAC CATHETERIZATION N/A 05/31/2016   Procedure: Intravascular Pressure Wire/FFR Study;  Surgeon: Peter M Martinique, MD;  Location: [place] CV LAB;  Service: Cardiovascular;  Laterality: N/A;  . CARDIAC CATHETERIZATION N/A 05/31/2016   Procedure: Coronary Stent Intervention;  Surgeon: Peter M Martinique, MD;  Location: [place] CV LAB;  Service: Cardiovascular;  Laterality: N/A;  . LEFT HEART CATH AND CORONARY ANGIOGRAPHY N/A 12/08/2017   Procedure: LEFT HEART CATH AND CORONARY ANGIOGRAPHY;  Surgeon: Leonie Man, MD;  Location: [place] CV LAB;  Service: Cardiovascular;  Laterality: N/A;  . LEFT HEART CATHETERIZATION WITH CORONARY ANGIOGRAM N/A 02/03/2014   Procedure: LEFT HEART CATHETERIZATION WITH CORONARY ANGIOGRAM;  Surgeon: Pixie Casino, MD;  Location: [facility] CATH LAB;  Service: Cardiovascular;  Laterality: N/A;  . left leg stent       Family History  Problem Relation  Age of Onset  . Cancer Father   . Hypertension Mother   . Diabetes Mother   . Breast cancer Mother   . Hypertension Brother   . Diabetes Brother   . Hypertension Sister   . Diabetes Sister     Social History   Tobacco Use  . Smoking status: Never Smoker  . Smokeless tobacco: Never Used  Substance Use Topics  . Alcohol use: No  . Drug use: No    ROS   Objective:   Vitals: BP 122/68 (BP Location: Right  Arm)   Pulse 66   Temp 98.3 F (36.8 C) (Oral)   Resp 18   Wt (!) 318 lb (144.2 kg)   SpO2 100%   BMI 45.63 kg/m   Physical Exam Constitutional:      General: He is not in acute distress.    Appearance: Normal appearance. He is well-developed and normal weight. He is not ill-appearing, toxic-appearing or diaphoretic.  HENT:     Head: Normocephalic and atraumatic.     Right Ear: There is impacted cerumen.     Left Ear: Tympanic membrane, ear canal and external ear normal. There is no impacted cerumen.     Nose: Nose normal.     Mouth/Throat:     Pharynx: Oropharynx is clear.  Eyes:     General: No scleral icterus.       Right eye: No discharge.        Left eye: No discharge.     Extraocular Movements: Extraocular movements intact.     Pupils: Pupils are equal, round, and reactive to light.  Cardiovascular:     Rate and Rhythm: Normal rate.  Pulmonary:     Effort: Pulmonary effort is normal.  Musculoskeletal:     Cervical back: Normal range of motion.  Neurological:     Mental Status: He is alert and oriented to person, place, and time.  Psychiatric:        Mood and Affect: Mood normal.        Behavior: Behavior normal.        Thought Content: Thought content normal.        Judgment: Judgment normal.     Ear lavage performed using mixture of peroxide and water.  Pressure irrigation performed using a bottle and a thin ear tube.  Right ear lavage only.  Curette was attempted and note feasible given how hard the cerumen was.   Assessment and Plan :    1. Impacted cerumen of right ear   2. Decreased hearing of right ear     Failed multiple attempts with pressure irrigation, curette. Patient will need to be seen by ENT for procedural removal of his cerumen impaction. Counseled patient on potential for adverse effects with medications prescribed/recommended today, ER and return-to-clinic precautions discussed, patient verbalized understanding.    Jaynee Eagles, [place] 11/13/19 1449

## 2019-11-13 NOTE — Discharge Instructions (Addendum)
Please call [facility] today for a consult on ear wax removal. Unfortunately, we were unsuccessful today after multiple attempts and you may require more aggressive intervention. In the meantime, take Tylenol at a dose of 500mg -650mg  once every 6 hours as needed for pain.

## 2019-11-27 ENCOUNTER — Other Ambulatory Visit: Payer: Self-pay | Admitting: *Deleted

## 2019-11-27 MED ORDER — DULOXETINE HCL 60 MG PO CPEP: 60.0000 mg | ORAL_CAPSULE | Freq: Every day | ORAL | 1 refills | Status: DC

## 2019-11-28 ENCOUNTER — Other Ambulatory Visit: Payer: Self-pay | Admitting: *Deleted

## 2019-11-28 ENCOUNTER — Telehealth: Payer: Self-pay | Admitting: Neurology

## 2019-11-28 MED ORDER — DULOXETINE HCL 60 MG PO CPEP: 60.0000 mg | ORAL_CAPSULE | Freq: Every day | ORAL | 1 refills | Status: DC

## 2019-11-28 NOTE — Telephone Encounter (Signed)
Kim from [facility] in [place] called and LVM yesterday afternoon wanting to confirm that the pt's prescription was sent to them correctly since they have not been able to reach the pt. Maudie Mercury [phone]

## 2019-11-28 NOTE — Telephone Encounter (Signed)
I spoke to the patient and he confirmed he is still taking duloxetine 60mg , one tablet daily. He has not been seen here since 11/19/2018. He is going to request his PCP to take over management of this prescription. He would like a refill sent to pharmacy to give him time to discuss this with his PCP. #30 x 1 was sent to the pharmacy.

## 2019-11-28 NOTE — Telephone Encounter (Signed)
I also returned the call to the pharmacy and left a detailed message with this information below. I provided our hours of operation and number to call back with any questions.

## 2020-01-06 ENCOUNTER — Other Ambulatory Visit: Payer: Self-pay

## 2020-01-06 ENCOUNTER — Encounter (HOSPITAL_BASED_OUTPATIENT_CLINIC_OR_DEPARTMENT_OTHER): Payer: Medicaid Other | Attending: Internal Medicine | Admitting: Internal Medicine

## 2020-01-06 DIAGNOSIS — I509 Heart failure, unspecified: Secondary | ICD-10-CM | POA: Insufficient documentation

## 2020-01-06 DIAGNOSIS — E1151 Type 2 diabetes mellitus with diabetic peripheral angiopathy without gangrene: Secondary | ICD-10-CM | POA: Insufficient documentation

## 2020-01-06 DIAGNOSIS — G4733 Obstructive sleep apnea (adult) (pediatric): Secondary | ICD-10-CM | POA: Insufficient documentation

## 2020-01-06 DIAGNOSIS — Z86718 Personal history of other venous thrombosis and embolism: Secondary | ICD-10-CM | POA: Diagnosis not present

## 2020-01-06 DIAGNOSIS — Z833 Family history of diabetes mellitus: Secondary | ICD-10-CM | POA: Diagnosis not present

## 2020-01-06 DIAGNOSIS — E11622 Type 2 diabetes mellitus with other skin ulcer: Secondary | ICD-10-CM | POA: Diagnosis present

## 2020-01-06 DIAGNOSIS — E1142 Type 2 diabetes mellitus with diabetic polyneuropathy: Secondary | ICD-10-CM | POA: Insufficient documentation

## 2020-01-06 DIAGNOSIS — Z809 Family history of malignant neoplasm, unspecified: Secondary | ICD-10-CM | POA: Insufficient documentation

## 2020-01-06 DIAGNOSIS — I110 Hypertensive heart disease with heart failure: Secondary | ICD-10-CM | POA: Insufficient documentation

## 2020-01-06 DIAGNOSIS — I890 Lymphedema, not elsewhere classified: Secondary | ICD-10-CM | POA: Insufficient documentation

## 2020-01-06 DIAGNOSIS — I2510 Atherosclerotic heart disease of native coronary artery without angina pectoris: Secondary | ICD-10-CM | POA: Insufficient documentation

## 2020-01-06 DIAGNOSIS — Z886 Allergy status to analgesic agent status: Secondary | ICD-10-CM | POA: Diagnosis not present

## 2020-01-06 DIAGNOSIS — I872 Venous insufficiency (chronic) (peripheral): Secondary | ICD-10-CM | POA: Diagnosis not present

## 2020-01-06 DIAGNOSIS — Z8249 Family history of ischemic heart disease and other diseases of the circulatory system: Secondary | ICD-10-CM | POA: Insufficient documentation

## 2020-01-06 NOTE — Progress Notes (Signed)
ASHWATH, CHAU ([id number]) Visit Report for 01/06/2020 Allergy List Details Patient Name: Date of Service: [name], [name] 01/06/2020 9:00 AM Medical Record Number:[id number] Patient Account Number: [id number] Date of Birth/Sex: Treating RN: 09/23/1961 (59 y.o. Jerilynn Mages) Carlene Coria Primary Care [place] Alfredo: [name] Other Clinician: Referring Kana Reimann: Treating Solace Wendorff/Extender:Robson, Biagio Quint, ROMANA Weeks in Treatment: 0 Allergies Active Allergies ibuprofen Aleve NSAIDS (Non-Steroidal Anti-Inflammatory Drug) Vascepa Allergy Notes Electronic Signature(s) Signed: 01/06/2020 5:26:01 PM By: Carlene Coria RN Entered By: Carlene Coria on 01/06/2020 09:08:39 -------------------------------------------------------------------------------- [place] Information Details Patient Name: Date of Service: [name] 01/06/2020 9:00 AM Medical Record Number:[id number] Patient Account Number: [id number] Date of Birth/Sex: Treating RN: July 27, 1961 (59 y.o. Jerilynn Mages) Carlene Coria Primary Care Ellowyn Rieves: [name] Other Clinician: Referring Brynlynn Walko: Treating Shanna Strength/Extender:Robson, Biagio Quint, ROMANA Weeks in Treatment: 0 Visit Information Patient Arrived: Ambulatory Arrival Time: 09:04 Accompanied By: self Transfer Assistance: None Patient Identification Verified: Yes Secondary Verification Process Completed: Yes Patient Requires Transmission-Based No Precautions: Patient Has Alerts: No Electronic Signature(s) Signed: 01/06/2020 5:26:01 PM By: Carlene Coria RN Entered By: Carlene Coria on 01/06/2020 09:06:15 -------------------------------------------------------------------------------- Clinic Level of Care Assessment Details Patient Name: Date of Service: [name], [name] 01/06/2020 9:00 AM Medical Record Number:[id number] Patient Account Number: [id number] Date of Birth/Sex: Treating RN: [birth year] (59 y.o. Janyth Contes Primary Care Donnavin Vandenbrink: [name] Other Clinician: Referring Bladen Umar: Treating Missi Mcmackin/Extender:Robson, Biagio Quint, ROMANA Weeks in Treatment: 0 Clinic Level of Care Assessment Items TOOL 2 Quantity Score X - Use when only an EandM is performed on the INITIAL visit 1 0 ASSESSMENTS - Nursing Assessment / Reassessment X - General Physical Exam (combine w/ comprehensive assessment (listed just below) 1 20 when performed on new pt. evals) X - Comprehensive Assessment (HX, ROS, Risk Assessments, Wounds Hx, etc.) 1 25 ASSESSMENTS - Wound and Skin Assessment / Reassessment []  - Simple Wound Assessment / Reassessment - one wound 0 []  - Complex Wound Assessment / Reassessment - multiple wounds 0 X - Dermatologic / Skin Assessment (not related to wound area) 1 10 ASSESSMENTS - Ostomy and/or Continence Assessment and Care []  - Incontinence Assessment and Management 0 []  - Ostomy Care Assessment and Management (repouching, etc.) 0 PROCESS - Coordination of Care X - Simple Patient / Family Education for ongoing care 1 15 []  - Complex (extensive) Patient / Family Education for ongoing care 0 X - Staff obtains Consents, Records, Test Results / Process Orders 1 10 []  - Staff telephones HHA, Nursing Homes / Clarify orders / etc 0 []  - Routine Transfer to another Facility (non-emergent condition) 0 []  - Routine Hospital Admission (non-emergent condition) 0 X - New Admissions / Biomedical engineer / Ordering NPWT, Apligraf, etc. 1 15 []  - Emergency Hospital Admission (emergent condition) 0 X - Simple Discharge Coordination 1 10 []  - Complex (extensive) Discharge Coordination 0 PROCESS - Special Needs []  - Pediatric / Minor Patient Management 0 []  - Isolation Patient Management 0 []  - Hearing / Language / Visual special needs 0 []  - Assessment of Community assistance (transportation, D/C planning, etc.) 0 []  - Additional assistance / Altered mentation 0 []  - Support Surface(s) Assessment (bed, cushion, seat, etc.)  0 INTERVENTIONS - Wound Cleansing / Measurement []  - Wound Imaging (photographs - any number of wounds) 0 []  - Wound Tracing (instead of photographs) 0 []  - Simple Wound Measurement - one wound 0 []  - Complex Wound Measurement - multiple wounds 0 []  - Simple Wound Cleansing - one wound 0 []  - Complex Wound Cleansing - multiple  wounds 0 INTERVENTIONS - Wound Dressings []  - Small Wound Dressing one or multiple wounds 0 []  - Medium Wound Dressing one or multiple wounds 0 []  - Large Wound Dressing one or multiple wounds 0 []  - Application of Medications - injection 0 INTERVENTIONS - Miscellaneous []  - External ear exam 0 []  - Specimen Collection (cultures, biopsies, blood, body fluids, etc.) 0 []  - Specimen(s) / Culture(s) sent or taken to Lab for analysis 0 []  - Patient Transfer (multiple staff / Harrel Lemon Lift / Similar devices) 0 []  - Simple Staple / Suture removal (25 or less) 0 []  - Complex Staple / Suture removal (26 or more) 0 []  - Hypo / Hyperglycemic Management (close monitor of Blood Glucose) 0 []  - Ankle / Brachial Index (ABI) - do not check if billed separately 0 Has the patient been seen at the hospital within the last three years: Yes Total Score: 105 Level Of Care: New/Established - Level 3 Electronic Signature(s) Signed: 01/06/2020 5:44:18 PM By: Levan Hurst RN, BSN Entered By: Levan Hurst on 01/06/2020 09:35:18 -------------------------------------------------------------------------------- Encounter Discharge Information Details Patient Name: Date of Service: [name]. 01/06/2020 9:00 AM Medical Record Number:[id number] Patient Account Number: [id number] Date of Birth/Sex: Treating RN: Feb 02, 1961 (59 y.o. [name] Primary Care Judene Logue: [name] Other Clinician: Referring Cleburn Maiolo: Treating Maicol Bowland/Extender:Robson, Biagio Quint, ROMANA Weeks in Treatment: 0 Encounter Discharge Information Items Discharge Condition: Stable Ambulatory  Status: Ambulatory Discharge Destination: Home Transportation: Private Auto Accompanied By: self Schedule Follow-up Appointment: Yes Clinical Summary of Care: Electronic Signature(s) Signed: 01/06/2020 5:25:09 PM By: Deon Pilling Entered By: Deon Pilling on 01/06/2020 09:39:08 -------------------------------------------------------------------------------- Pain Assessment Details Patient Name: Date of Service: PRESTEN, BEH 01/06/2020 9:00 AM Medical Record Number:[id number] Patient Account Number: [id number] Date of Birth/Sex: Treating RN: [birth year] (58 y.o. Jerilynn Mages) Carlene Coria Primary Care Takako Minckler: [name] Other Clinician: Referring Tenzin Edelman: Treating Reason Helzer/Extender:Robson, Biagio Quint, ROMANA Weeks in Treatment: 0 Active Problems Location of Pain Severity and Description of Pain Patient Has Paino No Site Locations Pain Management and Medication Current Pain Management: Electronic Signature(s) Signed: 01/06/2020 5:26:01 PM By: Carlene Coria RN Entered By: Carlene Coria on 01/06/2020 09:17:31 -------------------------------------------------------------------------------- Patient/Caregiver Education Details Patient Name: [name] 3/15/2021andnbsp9:00 Date of Service: AM Medical Record [id number] Number: Patient Account Number: [id number] Treating RN: Date of Birth/Gender: 12-30-1960 (58 y.o. Janyth Contes) Other Clinician: Primary Care Physician: [name] Treating Linton Ham Referring Physician: Physician/Extender: Bary Castilla in Treatment: 0 Education Assessment Education Provided To: Patient Education Topics Provided Venous: Handouts: Controlling Swelling with Compression Stockings , Managing Venous Disease and Related Ulcers Methods: Explain/Verbal Responses: State content correctly Wound/Skin Impairment: Handouts: Skin Care Do's and Dont's Methods: Explain/Verbal Responses: State content correctly Electronic  Signature(s) Signed: 01/06/2020 5:44:18 PM By: Levan Hurst RN, BSN Entered By: Levan Hurst on 01/06/2020 09:32:07 -------------------------------------------------------------------------------- Vitals Details Patient Name: Date of Service: [name]. 01/06/2020 9:00 AM Medical Record Number:[id number] Patient Account Number: [id number] Date of Birth/Sex: Treating RN: 04-21-1961 (58 y.o. Jerilynn Mages) Dolores Lory, [place] Primary Care Khylen Riolo: [name] Other Clinician: Referring Matteson Blue: Treating Makell Cyr/Extender:Robson, Biagio Quint, ROMANA Weeks in Treatment: 0 Vital Signs Time Taken: 09:06 Temperature (F): 98.5 Height (in): 70 Pulse (bpm): 79 Source: Stated Respiratory Rate (breaths/min): 18 Weight (lbs): 320 Blood Pressure (mmHg): 123/63 Source: Stated Reference Range: 80 - 120 mg / dl Body Mass Index (BMI): 45.9 Electronic Signature(s) Signed: 01/06/2020 5:26:01 PM By: Carlene Coria RN Entered By: Carlene Coria on 01/06/2020 09:07:00

## 2020-01-06 NOTE — Progress Notes (Signed)
[name], [name] ([id number]) Visit Report for 01/06/2020 Abuse/Suicide Risk Screen Details Patient Name: Date of Service: [name], [name] 01/06/2020 9:00 AM Medical Record Number:[id number] Patient Account Number: [id number] Date of Birth/Sex: Treating RN: [birth year] (58 y.o. Jerilynn Mages) Carlene Coria Primary Care Jadalynn Burr: [name] Other Clinician: Referring Jeanne Diefendorf: Treating Jamoni Broadfoot/Extender:Robson, Biagio Quint, ROMANA Weeks in Treatment: 0 Abuse/Suicide Risk Screen Items Answer ABUSE RISK SCREEN: Has anyone close to you tried to hurt or harm you recentlyo No Do you feel uncomfortable with anyone in your familyo No Has anyone forced you do things that you didnt want to doo No Electronic Signature(s) Signed: 01/06/2020 5:26:01 PM By: Carlene Coria RN Entered By: Carlene Coria on 01/06/2020 09:13:19 -------------------------------------------------------------------------------- Activities of Daily Living Details Patient Name: Date of Service: [name], [name] 01/06/2020 9:00 AM Medical Record Number:[id number] Patient Account Number: [id number] Date of Birth/Sex: Treating RN: [birth year] (58 y.o. Jerilynn Mages) Carlene Coria Primary Care Al Gagen: [name] Other Clinician: Referring Taje Littler: Treating Daimien Patmon/Extender:Robson, Biagio Quint, ROMANA Weeks in Treatment: 0 Activities of Daily Living Items Answer Activities of Daily Living (Please select one for each item) Drive Automobile Completely Able Take Medications Completely Able Use Telephone Completely Able Care for Appearance Completely Able Use Toilet Completely Able Bath / Shower Completely Able Dress Self Completely Able Feed Self Completely Able Walk Completely Able Get In / Out Bed Completely Able Housework Completely Able Prepare Meals Completely Able Handle Money Completely Able Shop for Self Completely Able Electronic Signature(s) Signed: 01/06/2020 5:26:01 PM By: Carlene Coria RN Entered By: Carlene Coria on  01/06/2020 09:13:43 -------------------------------------------------------------------------------- Education Screening Details Patient Name: Date of Service: [name] 01/06/2020 9:00 AM Medical Record Number:[id number] Patient Account Number: [id number] Date of Birth/Sex: Treating RN: 1961/06/17 (58 y.o. Jerilynn Mages) Carlene Coria Primary Care Regla Fitzgibbon: [name] Other Clinician: Referring Arieon Scalzo: Treating Kalyna Paolella/Extender:Robson, Biagio Quint, ROMANA Weeks in Treatment: 0 Primary Learner Assessed: Patient Learning Preferences/Education Level/Primary Language Learning Preference: Explanation Highest Education Level: High School Preferred Language: English Cognitive Barrier Language Barrier: No Translator Needed: No Memory Deficit: No Emotional Barrier: No Cultural/Religious Beliefs Affecting Medical Care: No Physical Barrier Impaired Vision: Yes Glasses Impaired Hearing: No Decreased Hand dexterity: No Knowledge/Comprehension Knowledge Level: Medium Comprehension Level: High Ability to understand written High instructions: Ability to understand verbal High instructions: Motivation Anxiety Level: Calm Cooperation: Cooperative Education Importance: Acknowledges Need Interest in Health Problems: Asks Questions Perception: Coherent Willingness to Engage in Self- High Management Activities: Readiness to Engage in Self- High Management Activities: Electronic Signature(s) Signed: 01/06/2020 5:26:01 PM By: Carlene Coria RN Entered By: Carlene Coria on 01/06/2020 09:14:27 -------------------------------------------------------------------------------- Fall Risk Assessment Details Patient Name: Date of Service: [name] 01/06/2020 9:00 AM Medical Record Number:[id number] Patient Account Number: [id number] Date of Birth/Sex: Treating RN: 1961/05/06 (58 y.o. Jerilynn Mages) Carlene Coria Primary Care Abenezer Odonell: [name] Other Clinician: Referring Lanna Labella:  Treating Danta Baumgardner/Extender:Robson, Biagio Quint, ROMANA Weeks in Treatment: 0 Fall Risk Assessment Items Have you had 2 or more falls in the last 12 monthso 0 No Have you had any fall that resulted in injury in the last 12 monthso 0 No FALLS RISK SCREEN History of falling - immediate or within 3 months 0 No Secondary diagnosis (Do you have 2 or more medical diagnoseso) 0 No Ambulatory aid None/bed rest/wheelchair/nurse 0 No Crutches/cane/walker 0 No Furniture 0 No Intravenous therapy Access/Saline/Heparin Lock 0 No Weak (short steps with or without shuffle, stooped but able to lift head 0 No while walking, may seek support from furniture) Impaired (short steps with shuffle, may have  difficulty arising from chair, 0 No head down, impaired balance) Mental Status Oriented to own ability 0 No Overestimates or forgets limitations 0 No Risk Level: Low Risk Score: 0 Electronic Signature(s) Signed: 01/06/2020 5:26:01 PM By: Carlene Coria RN Entered By: Carlene Coria on 01/06/2020 09:14:36 -------------------------------------------------------------------------------- Foot Assessment Details Patient Name: Date of Service: [name]. 01/06/2020 9:00 AM Medical Record Number:[id number] Patient Account Number: [id number] Date of Birth/Sex: Treating RN: 1961/03/15 (58 y.o. Jerilynn Mages) Carlene Coria Primary Care Tavionna Grout: [name] Other Clinician: Referring Iysis Germain: Treating Alik Mawson/Extender:Robson, Biagio Quint, ROMANA Weeks in Treatment: 0 Foot Assessment Items Site Locations + = Sensation present, - = Sensation absent, C = Callus, U = Ulcer R = Redness, W = Warmth, M = Maceration, PU = Pre-ulcerative lesion F = Fissure, S = Swelling, D = Dryness Assessment Right: Left: Other Deformity: No No Prior Foot Ulcer: No No Prior Amputation: No No Charcot Joint: No No Ambulatory Status: Ambulatory Without Help Gait: Steady Electronic Signature(s) Signed: 01/06/2020 5:26:01 PM By:  Carlene Coria RN Entered By: Carlene Coria on 01/06/2020 09:16:39 -------------------------------------------------------------------------------- Nutrition Risk Screening Details Patient Name: Date of Service: [name], [name] 01/06/2020 9:00 AM Medical Record Number:[id number] Patient Account Number: [id number] Date of Birth/Sex: Treating RN: 29-Nov-1960 (58 y.o. Jerilynn Mages) Carlene Coria Primary Care Lizbett Garciagarcia: [name] Other Clinician: Referring Corri Delapaz: Treating Rodric Punch/Extender:Robson, Biagio Quint, ROMANA Weeks in Treatment: 0 Height (in): 70 Weight (lbs): 320 Body Mass Index (BMI): 45.9 Nutrition Risk Screening Items Score Screening NUTRITION RISK SCREEN: I have an illness or condition that made me change the kind and/or 0 No amount of food I eat I eat fewer than two meals per day 0 No I eat few fruits and vegetables, or milk products 0 No I have three or more drinks of beer, liquor or wine almost every day 0 No I have tooth or mouth problems that make it hard for me to eat 0 No I don't always have enough money to buy the food I need 0 No I eat alone most of the time 0 No I take three or more different prescribed or over-the-counter drugs a day 1 Yes 0 No Without wanting to, I have lost or gained 10 pounds in the last six months I am not always physically able to shop, cook and/or feed myself 0 No Nutrition Protocols Good Risk Protocol 0 No interventions needed Moderate Risk Protocol High Risk Proctocol Risk Level: Good Risk Score: 1 Electronic Signature(s) Signed: 01/06/2020 5:26:01 PM By: Carlene Coria RN Entered By: Carlene Coria on 01/06/2020 09:15:07

## 2020-01-07 NOTE — Progress Notes (Signed)
[name] ([id number]) Visit Report for 01/06/2020 Chief Complaint Document Details Patient Name: Date of Service: [name], [name] 01/06/2020 9:00 AM Medical Record Number:[id number] Patient Account Number: [id number] Date of Birth/Sex: Treating RN: 07-May-1961 (59 y.o. Janyth Contes Primary Care Provider: [name] Other Clinician: Referring Provider: Treating Provider/Extender:Robson, Biagio Quint, ROMANA Weeks in Treatment: 0 Information Obtained from: Patient Chief Complaint 01/06/2020; patient is here for review of his bilateral lower legs having wounds on his bilateral lower legs 2 weeks ago which have since healed Electronic Signature(s) Signed: 01/07/2020 10:32:20 AM By: Linton Ham MD Entered By: Linton Ham on 01/06/2020 09:38:20 -------------------------------------------------------------------------------- HPI Details Patient Name: Date of Service: Juan Pick. 01/06/2020 9:00 AM Medical Record Number:[id number] Patient Account Number: [id number] Date of Birth/Sex: Treating RN: 02-06-1961 (58 y.o. M) Primary Care Provider: [name] Other Clinician: Referring Provider: Treating Provider/Extender:Robson, Biagio Quint, ROMANA Weeks in Treatment: 0 History of Present Illness HPI Description: [name] 2019 HPI Description: 04/03/18 patient presents today for initial evaluation and our clinic as root referral from his emergency department after patient was seen on 03/25/18 due to bilateral leg swelling. He had blistering along with burning discomfort. According to the report patient does take Lasix daily as prescribed as well is a potassium supplement. He has not been wearing his compression stockings and states these are thigh-high and he does not like to wear them at all. Subsequently he has previously warned stockings on a regular basis but these are just too uncomfortable. He did not have any fever no evidence of cellulitis at that  point in time. Subsequently they seem to be more focused on his potassium level during the stay. Nonetheless the patient was discharged and told to call the wound care center for a follow-up appointment to evaluate his lower extremities. He does have evidence of lymphedema this changes including thickened/scaly skin over the bilateral lower extremities this does appear to be stage III lymphedema. Patient has had a vascular ultrasound for DVT on 12/07/17 and apparently has a repeat evaluation in July. However I did not find venous reflex testing as a part of any of the studies seen through epic. Subsequently his hemoglobin A-1 C was 8.9 as obtained on 11/09/17. Patient states that he has pain and seems to be more consistent with neuropathy but really no significant discomfort otherwise at this point. He does have a history of hypertension, DVT, lymphedema, congestive heart failure, and diabetes mellitus type II. 04/10/18 on evaluation today patient's bilateral lower extremities actually appear to be doing well from the standpoint of ulcers. There do not appear to be any significant issues with his legs and weeping either. In fact they are little bit dry if anything. Overall I'm pleased with how things seem to be progressing. He is scheduled for his vascular appointment upcoming shortly him we had this [facility] is July 8 but he tells me that he is actually seeing them on the 21st. That is of June. This is even better news obviously. The sooner we can determine whether or not the patient can tolerate compression therapy the sooner I think we can get him better. 04/17/18 on evaluation today patient actually appears to be doing very well. Did review the results of his vascular testing which did appear to be normal he had a right ABI of 1.26 with a TBI of 0.95 in the left ABI of 1.30 with a TBI of 0.71. There did not appear to be any evidence of significant arterial disease bilaterally. Overall this  is  excellent news. He seems to be doing very well today and in fact he does have some evidence of the right lower extremity actually doing very well with no openings. He does have several superficial openings in regard to the left lower extremity fortunately this does not appear to be too terrible however there mainly superficial. 04/24/18 on evaluation today patient appears to be doing rather well in regard to his bilateral lower extremities. He has been tolerating the dressing changes without complication and there does not appear to be any evidence of infection. The good news is his wounds actually all appear to be completely healed which is great. He states that he's actually going to take the measurements and got [employer] to get his compression stockings today. I think this is definitely going to be the best thing for him although he can continue to wear the Tubigrip until he gets them. ADMISSION 01/06/2020 This is a 59 year old man who is on disability secondary to history of cerebrovascular disease. As noted above he was seen in our clinic in for visits in [place] in 2019 with bilateral lower leg wounds felt to be secondary to chronic insufficiency mainly. He is very compliant with wearing 20/30 below-knee compression stockings which she gets on a 3-4 monthly basis from elastic therapy in [facility]. He also moisturizes his lower legs. He says about 2 to 3 weeks ago he was taking his stockings off and he noticed some bleeding superficial wounds on his bilateral lower legs. He had some saline from when he stayed in [place] a few years ago. Wash the wounds off applied triple antibiotic and some form of compression dressing and the wounds have healed. He comes in today with no open area. 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 The patient had arterial studies on 04/13/2018 which showed an ABI in the right of 1.26 on the left at 1.30. TBI's were 0.95 and 0.71 respectively. He had triphasic waveforms bilaterally not at the time indicative of any major PAD Electronic Signature(s) Signed: 01/07/2020 10:32:20 AM By: Linton Ham MD Entered By: Linton Ham on 01/06/2020 09:41:17 -------------------------------------------------------------------------------- Physical Exam Details Patient Name: Date of Service: Juan Pick. 01/06/2020 9:00 AM Medical Record Number:[id number] Patient Account Number: [id number] Date of Birth/Sex: Treating RN: [birth year] (59 y.o. Janyth Contes Primary Care Provider: [name] Other Clinician: Referring Provider: Treating Provider/Extender:Robson, Biagio Quint, ROMANA Weeks in Treatment: 0 Constitutional Sitting or standing Blood Pressure is within target range for patient.. Pulse regular and within target range for patient.Marland Kitchen Respirations regular, non-labored and within target range.. Temperature is normal and within the target range for the patient.Marland Kitchen Appears in no distress. Respiratory work of breathing is normal. Bilateral breath sounds are clear and equal in all lobes with no wheezes, rales or rhonchi.. Cardiovascular Heart rhythm and rate regular, without murmur or gallop.. Pedal pulses palpable and strong bilaterally.Marland Kitchen Nonpitting edema. Fibrotic skin in the lower extremities just above the ankles. Integumentary (Hair, Skin) Skin changes suggestive of chronic venous insufficiency.. . Neurological Decreased sensation to the microfilament and vibration. Psychiatric appears at normal baseline. Notes Wound exam; there is no open wound here. Electronic Signature(s) Signed: 01/07/2020 10:32:20 AM By: Linton Ham MD Entered By: Linton Ham on 01/06/2020  09:42:41 -------------------------------------------------------------------------------- Physician Orders Details Patient Name: Date of Service: Juan Pick 01/06/2020 9:00 AM Medical Record Number:[id number] Patient Account Number: [id number]  Date of Birth/Sex: Treating RN: [birth year] (59 y.o. Janyth Contes Primary Care Provider: [name] Other Clinician: Referring Provider: Treating Provider/Extender:Robson, Biagio Quint, ROMANA Weeks in Treatment: 0 Verbal / Phone Orders: No Diagnosis Coding Discharge From [facility] Services Discharge from [place] only - no open wounds Skin Barriers/Peri-Wound Care Moisturizing lotion - both legs daily Edema Control Patient to wear own compression stockings - both legs daily Avoid standing for long periods of time Elevate legs to the level of the heart or above for 30 minutes daily and/or when sitting, a frequency of: - throughout the day Exercise regularly Electronic Signature(s) Signed: 01/06/2020 5:44:18 PM By: Levan Hurst RN, BSN Signed: 01/07/2020 10:32:20 AM By: Linton Ham MD Entered By: Levan Hurst on 01/06/2020 09:30:27 -------------------------------------------------------------------------------- Problem List Details Patient Name: Date of Service: Juan Pick. 01/06/2020 9:00 AM Medical Record Number:[id number] Patient Account Number: [id number] Date of Birth/Sex: Treating RN: 08-May-1961 (58 y.o. Janyth Contes Primary Care Provider: [name] Other Clinician: Referring Provider: Treating Provider/Extender:Robson, Biagio Quint, ROMANA Weeks in Treatment: 0 Active Problems ICD-10 Evaluated Encounter Code Description Active Date Today Diagnosis I87.323 Chronic venous hypertension (idiopathic) with 01/06/2020 No Yes inflammation of bilateral lower extremity I89.0 Lymphedema, not elsewhere classified 01/06/2020 No Yes E11.42 Type 2 diabetes mellitus with diabetic  polyneuropathy 01/06/2020 No Yes Inactive Problems Resolved Problems Electronic Signature(s) Signed: 01/07/2020 10:32:20 AM By: Linton Ham MD Entered By: Linton Ham on 01/06/2020 09:37:14 -------------------------------------------------------------------------------- Progress Note Details Patient Name: Date of Service: Juan Pick. 01/06/2020 9:00 AM Medical Record Number:[id number] Patient Account Number: [id number] Date of Birth/Sex: Treating RN: May 17, 1961 (58 y.o. Janyth Contes Primary Care Provider: [name] Other Clinician: Referring Provider: Treating Provider/Extender:Robson, Biagio Quint, ROMANA Weeks in Treatment: 0 Subjective Chief Complaint Information obtained from Patient 01/06/2020; patient is here for review of his bilateral lower legs having wounds on his bilateral lower legs 2 weeks ago which have since healed History of Present Illness (HPI) [place] 2019 HPI Description: 04/03/18 patient presents today for initial evaluation and our clinic as root referral from his emergency department after patient was seen on 03/25/18 due to bilateral leg swelling. He had blistering along with burning discomfort. According to the report patient does take Lasix daily as prescribed as well is a potassium supplement. He has not been wearing his compression stockings and states these are thigh-high and he does not like to wear them at all. Subsequently he has previously warned stockings on a regular basis but these are just too uncomfortable. He did not have any fever no evidence of cellulitis at that point in time. Subsequently they seem to be more focused on his potassium level during the stay. Nonetheless the patient was discharged and told to call the wound care center for a follow-up appointment to evaluate his lower extremities. He does have evidence of lymphedema this changes including thickened/scaly skin over the bilateral lower extremities this does  appear to be stage III lymphedema. Patient has had a vascular ultrasound for DVT on 12/07/17 and apparently has a repeat evaluation in July. However I did not find venous reflex testing as a part of any of the studies seen through epic. Subsequently his hemoglobin A-1 C was 8.9 as obtained on 11/09/17. Patient states that he has pain and seems to be more consistent with neuropathy but really no significant discomfort otherwise at this point. He does have a history of hypertension, DVT, lymphedema, congestive heart failure, and diabetes mellitus type II. 04/10/18 on evaluation today  patient's bilateral lower extremities actually appear to be doing well from the standpoint of ulcers. There do not appear to be any significant issues with his legs and weeping either. In fact they are little bit dry if anything. Overall I'm pleased with how things seem to be progressing. He is scheduled for his vascular appointment upcoming shortly him we had this [facility] is July 8 but he tells me that he is actually seeing them on the 21st. That is of June. This is even better news obviously. The sooner we can determine whether or not the patient can tolerate compression therapy the sooner I think we can get him better. 04/17/18 on evaluation today patient actually appears to be doing very well. Did review the results of his vascular testing which did appear to be normal he had a right ABI of 1.26 with a TBI of 0.95 in the left ABI of 1.30 with a TBI of 0.71. There did not appear to be any evidence of significant arterial disease bilaterally. Overall this is excellent news. He seems to be doing very well today and in fact he does have some evidence of the right lower extremity actually doing very well with no openings. He does have several superficial openings in regard to the left lower extremity fortunately this does not appear to be too terrible however there mainly superficial. 04/24/18 on evaluation today patient  appears to be doing rather well in regard to his bilateral lower extremities. He has been tolerating the dressing changes without complication and there does not appear to be any evidence of infection. The good news is his wounds actually all appear to be completely healed which is great. He states that he's actually going to take the measurements and got [employer] to get his compression stockings today. I think this is definitely going to be the best thing for him although he can continue to wear the Tubigrip until he gets them. ADMISSION 01/06/2020 This is a 59 year old man who is on disability secondary to history of cerebrovascular disease. As noted above he was seen in our clinic in for visits in [place] in 2019 with bilateral lower leg wounds felt to be secondary to chronic insufficiency mainly. He is very compliant with wearing 20/30 below-knee compression stockings which she gets on a 3-4 monthly basis from elastic therapy in [facility]. He also moisturizes his lower legs. He says about 2 to 3 weeks ago he was taking his stockings off and he noticed some bleeding superficial wounds on his bilateral lower legs. He had some saline from when he stayed in [place] a few years ago. Wash the wounds off applied triple antibiotic and some form of compression dressing and the wounds have healed. He comes in today with no open area. 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 The patient had arterial studies on 04/13/2018 which showed an ABI in the right of 1.26 on the left at 1.30. TBI's were 0.95 and 0.71 respectively. He had triphasic waveforms bilaterally not at the time indicative of any major PAD Patient History Unable to Obtain Patient History due to Altered Mental Status. Information obtained from  Patient. Allergies ibuprofen, Aleve, NSAIDS (Non-Steroidal Anti-Inflammatory Drug), Vascepa Family History Cancer - Mother,Father, Diabetes - Mother,Siblings, Hypertension - Siblings,Mother, Seizures - Mother,Siblings, Stroke - Siblings, No family history of Heart Disease, Hereditary Spherocytosis, Kidney Disease, Lung Disease, Thyroid Problems, Tuberculosis. Social  History Never smoker, Marital Status - Divorced, Alcohol Use - Rarely, Drug Use - No History, Caffeine Use - Daily. Medical History Eyes Denies history of Cataracts, Glaucoma, Optic Neuritis Ear/Nose/Mouth/Throat Denies history of Chronic sinus problems/congestion, Middle ear problems Hematologic/Lymphatic Denies history of Anemia, Hemophilia, Human Immunodeficiency Virus, Lymphedema, Sickle Cell Disease Respiratory Denies history of Aspiration, Asthma, Chronic Obstructive Pulmonary Disease (COPD), Pneumothorax, Sleep Apnea, Tuberculosis Cardiovascular Patient has history of Congestive Heart Failure, Coronary Artery Disease, Deep Vein Thrombosis, Hypertension Denies history of Angina, Arrhythmia, Hypotension, Myocardial Infarction, Peripheral Arterial Disease, Peripheral Venous Disease, Phlebitis, Vasculitis Gastrointestinal Denies history of Cirrhosis , Colitis, Crohnoos, Hepatitis A, Hepatitis B, Hepatitis C Endocrine Patient has history of Type II Diabetes Denies history of Type I Diabetes Genitourinary Denies history of End Stage Renal Disease Immunological Denies history of Lupus Erythematosus, Raynaudoos, Scleroderma Integumentary (Skin) Denies history of History of Burn Musculoskeletal Denies history of Gout, Rheumatoid Arthritis, Osteoarthritis, Osteomyelitis Neurologic Patient has history of Neuropathy Denies history of Dementia, Quadriplegia, Paraplegia, Seizure Disorder Oncologic Denies history of Received Chemotherapy, Received Radiation Psychiatric Denies history of Anorexia/bulimia, Confinement  Anxiety Patient is treated with Insulin, Oral Agents. Blood sugar is not tested. Review of Systems (ROS) Constitutional Symptoms (General Health) Denies complaints or symptoms of Fatigue, Fever, Chills, Marked Weight Change. Eyes Denies complaints or symptoms of Dry Eyes, Vision Changes, Glasses / Contacts. Ear/Nose/Mouth/Throat Denies complaints or symptoms of Chronic sinus problems or rhinitis. Respiratory Denies complaints or symptoms of Chronic or frequent coughs, Shortness of Breath. Cardiovascular Denies complaints or symptoms of Chest pain. Gastrointestinal Denies complaints or symptoms of Frequent diarrhea, Nausea, Vomiting. Endocrine Denies complaints or symptoms of Heat/cold intolerance. Genitourinary Denies complaints or symptoms of Frequent urination. Integumentary (Skin) Complains or has symptoms of Wounds. Musculoskeletal Denies complaints or symptoms of Muscle Pain, Muscle Weakness. Neurologic Denies complaints or symptoms of Numbness/parasthesias. Psychiatric Denies complaints or symptoms of Claustrophobia, Suicidal. Objective Constitutional Sitting or standing Blood Pressure is within target range for patient.. Pulse regular and within target range for patient.Marland Kitchen Respirations regular, non-labored and within target range.. Temperature is normal and within the target range for the patient.Marland Kitchen Appears in no distress. Vitals Time Taken: 9:06 AM, Height: 70 in, Source: Stated, Weight: 320 lbs, Source: Stated, BMI: 45.9, Temperature: 98.5 F, Pulse: 79 bpm, Respiratory Rate: 18 breaths/min, Blood Pressure: 123/63 mmHg. Respiratory work of breathing is normal. Bilateral breath sounds are clear and equal in all lobes with no wheezes, rales or rhonchi.. Cardiovascular Heart rhythm and rate regular, without murmur or gallop.. Pedal pulses palpable and strong bilaterally.Marland Kitchen Nonpitting edema. Fibrotic skin in the lower extremities just above the  ankles. Neurological Decreased sensation to the microfilament and vibration. Psychiatric appears at normal baseline. General Notes: Wound exam; there is no open wound here. Integumentary (Hair, Skin) Skin changes suggestive of chronic venous insufficiency.. Assessment Active Problems ICD-10 Chronic venous hypertension (idiopathic) with inflammation of bilateral lower extremity Lymphedema, not elsewhere classified Type 2 diabetes mellitus with diabetic polyneuropathy Plan Discharge From [facility] Services: Discharge from [place] only - no open wounds Skin Barriers/Peri-Wound Care: Moisturizing lotion - both legs daily Edema Control: Patient to wear own compression stockings - both legs daily Avoid standing for long periods of time Elevate legs to the level of the heart or above for 30 minutes daily and/or when sitting, a frequency of: - throughout the day Exercise regularly 1. I am not sure why he developed wounds bilaterally last week but he does not have a recent wound history  dating back to when he was last seen in our sister clinic in [place] in 2019. Currently today he does not have an open wound. 2. It would appear that his 20/30 stockings are controlling the amount of swelling in his legs. I think he has chronic venous insufficiency and some degree of lymphedema. Skin damage related to this and xerotic right lower extremity skin however he is using a skin lubricant. 3. At this point I do not think there is a reason to change what he is doing unless he develops more frequent wounds in which case he will require more aggressive compression. I did not change anything he was doing at this point. 4. He does not have any open wounds today. We do not need to follow him here unless he develops recurrent problems and I asked him to call our clinic 5. He has a history of congestive heart failure and coronary artery disease. He is on 120 mg of Lasix a day. I did  not see any evidence of congestive heart failure at the bedside heart sounds were normal JVP not elevated no sacral edema lungs were clear I spent 32 minutes on review of this patient's records, face-to-face evaluation and preparation of this record. Electronic Signature(s) Signed: 01/07/2020 10:32:20 AM By: Linton Ham MD Entered By: Linton Ham on 01/06/2020 09:45:18 -------------------------------------------------------------------------------- HxROS Details Patient Name: Date of Service: Juan Pick 01/06/2020 9:00 AM Medical Record Number:[id number] Patient Account Number: [id number] Date of Birth/Sex: Treating RN: November 07, 1960 (58 y.o. Jerilynn Mages) Carlene Coria Primary Care Provider: [name] Other Clinician: Referring Provider: Treating Provider/Extender:Robson, Biagio Quint, ROMANA Weeks in Treatment: 0 Unable to Obtain Patient History due to Altered Mental Status Information Obtained From Patient Constitutional Symptoms (General Health) Complaints and Symptoms: Negative for: Fatigue; Fever; Chills; Marked Weight Change Eyes Complaints and Symptoms: Negative for: Dry Eyes; Vision Changes; Glasses / Contacts Medical History: Negative for: Cataracts; Glaucoma; Optic Neuritis Ear/Nose/Mouth/Throat Complaints and Symptoms: Negative for: Chronic sinus problems or rhinitis Medical History: Negative for: Chronic sinus problems/congestion; Middle ear problems Respiratory Complaints and Symptoms: Negative for: Chronic or frequent coughs; Shortness of Breath Medical History: Negative for: Aspiration; Asthma; Chronic Obstructive Pulmonary Disease (COPD); Pneumothorax; Sleep Apnea; Tuberculosis Cardiovascular Complaints and Symptoms: Negative for: Chest pain Medical History: Positive for: Congestive Heart Failure; Coronary Artery Disease; Deep Vein Thrombosis; Hypertension Negative for: Angina; Arrhythmia; Hypotension; Myocardial Infarction; Peripheral Arterial  Disease; Peripheral Venous Disease; Phlebitis; Vasculitis Gastrointestinal Complaints and Symptoms: Negative for: Frequent diarrhea; Nausea; Vomiting Medical History: Negative for: Cirrhosis ; Colitis; Crohns; Hepatitis A; Hepatitis B; Hepatitis C Endocrine Complaints and Symptoms: Negative for: Heat/cold intolerance Medical History: Positive for: Type II Diabetes Negative for: Type I Diabetes Time with diabetes: 10 years Treated with: Insulin, Oral agents Blood sugar tested every day: No Genitourinary Complaints and Symptoms: Negative for: Frequent urination Medical History: Negative for: End Stage Renal Disease Integumentary (Skin) Complaints and Symptoms: Positive for: Wounds Medical History: Negative for: History of Burn Musculoskeletal Complaints and Symptoms: Negative for: Muscle Pain; Muscle Weakness Medical History: Negative for: Gout; Rheumatoid Arthritis; Osteoarthritis; Osteomyelitis Neurologic Complaints and Symptoms: Negative for: Numbness/parasthesias Medical History: Positive for: Neuropathy Negative for: Dementia; Quadriplegia; Paraplegia; Seizure Disorder Psychiatric Complaints and Symptoms: Negative for: Claustrophobia; Suicidal Medical History: Negative for: Anorexia/bulimia; Confinement Anxiety Hematologic/Lymphatic Medical History: Negative for: Anemia; Hemophilia; Human Immunodeficiency Virus; Lymphedema; Sickle Cell Disease Immunological Medical History: Negative for: Lupus Erythematosus; Raynauds; Scleroderma Oncologic Medical History: Negative for: Received Chemotherapy; Received Radiation Immunizations Pneumococcal Vaccine: Received Pneumococcal Vaccination: No Implantable  Devices None Family and Social History Cancer: Yes - Mother,Father; Diabetes: Yes - Mother,Siblings; Heart Disease: No; Hereditary Spherocytosis: No; Hypertension: Yes - Siblings,Mother; Kidney Disease: No; Lung Disease: No; Seizures: Yes -  Mother,Siblings; Stroke: Yes - Siblings; Thyroid Problems: No; Tuberculosis: No; Never smoker; Marital Status - Divorced; Alcohol Use: Rarely; Drug Use: No History; Caffeine Use: Daily; Financial Concerns: No; Food, Clothing or Shelter Needs: No; Support System Lacking: No; Transportation Concerns: No Electronic Signature(s) Signed: 01/06/2020 5:26:01 PM By: Carlene Coria RN Signed: 01/07/2020 10:32:20 AM By: Linton Ham MD Entered By: Carlene Coria on 01/06/2020 09:13:10 -------------------------------------------------------------------------------- SuperBill Details Patient Name: Date of Service: Juan Pick 01/06/2020 Medical Record Number:[id number] Patient Account Number: [id number] Date of Birth/Sex: Treating RN: 02-24-1961 (58 y.o. Janyth Contes Primary Care Provider: [name] Other Clinician: Referring Provider: Treating Provider/Extender:Robson, Biagio Quint, ROMANA Weeks in Treatment: 0 Diagnosis Coding ICD-10 Codes Code Description I87.323 Chronic venous hypertension (idiopathic) with inflammation of bilateral lower extremity I89.0 Lymphedema, not elsewhere classified E11.42 Type 2 diabetes mellitus with diabetic polyneuropathy Facility Procedures CPT4 Code: [id number] Description: 99213 - WOUND CARE VISIT-LEV 3 EST PT Modifier: Quantity: 1 Physician Procedures CPT4 Code Description: [id number] WC PHYS LEVEL 3 NEW PT ICD-10 Diagnosis Description I87.323 Chronic venous hypertension (idiopathic) with inflammatio extremity I89.0 Lymphedema, not elsewhere classified E11.42 Type 2 diabetes mellitus with diabetic  polyneuropathy Modifier: n of bilateral lower Quantity: 1 Electronic Signature(s) Signed: 01/06/2020 5:44:18 PM By: Levan Hurst RN, BSN Signed: 01/07/2020 10:32:20 AM By: Linton Ham MD Entered By: Levan Hurst on 01/06/2020 13:15:43

## 2020-01-08 NOTE — Progress Notes (Signed)
Virtual Visit via Video Note changed to phone visit at patient request   This visit type was conducted due to national recommendations for restrictions regarding the COVID-19 Pandemic (e.g. social distancing) in an effort to limit this patient's exposure and mitigate transmission in our community.  Due to his co-morbid illnesses, this patient is at least at moderate risk for complications without adequate follow up.  This format is felt to be most appropriate for this patient at this time.  All issues noted in this document were discussed and addressed.  A limited physical exam was performed with this format.  Please refer to the patient's chart for his consent to telehealth for [facility].   Date:  01/14/2020   ID:  [name], DOB 04-18-1961, MRN [id number]  Patient Location:Home Provider Location: Home  PCP:  [name], FNP  Cardiologist:  Dr Stanford Breed  Evaluation Performed:  Follow-Up Visit  Chief Complaint:  FU CAD and CHF  History of Present Illness:    FU CADand diastolic CHF.Pt had DES to LAD 8/17.CTA 9/17 showed no pulmonary embolus.Cardiac catheterization February 2019 showed patent stents and normal LV function. Left ventricular end-diastolic pressure elevated at 27 mmHg.Carotid Dopplers April 2019 showed 1 to 39% right and near normal left carotid. CTA August 2019 showed no thoracic aortic aneurysm. Echocardiogram June 2020 showed normal LV function, mild left ventricular hypertrophy, grade 1 diastolic dysfunction and mildly dilated aortic root.  Since last seenpatient describes occasional dyspnea and chest pain which is longstanding.  Typically occurs with activities.  However he exercises on the elliptical, exercise bicycle and treadmill and states that he has no chest pain with these activities.  No recurrent syncope.  The patient does not have symptoms concerning for COVID-19 infection (fever, chills, cough, or new shortness of breath).    Past Medical  History:  Diagnosis Date  . Anemia   . Arthritis   . Back pain   . CAD (coronary artery disease)    a. s/p DES to LAD in 05/2016  . Cervical radiculopathy   . Chronic diastolic CHF (congestive heart failure) ([place])   . Chronic pain   . Depression   . DVT (deep venous thrombosis) ([place])   . Hematemesis   . Hepatic steatosis   . Hyperlipidemia   . Hypertension   . IBS (irritable bowel syndrome)   . Morbid obesity ([place])   . OSA (obstructive sleep apnea)   . Pancreatitis   . PE (pulmonary thromboembolism) ([place])   . Peripheral neuropathy   . PUD (peptic ulcer disease)   . Renal disorder   . Stroke [facility])    a. ?details unclear - not seen on imaging when he was admitted in 05/2017 for TIA symptoms which were felt due to cervical radiculopathy.  . Thoracic aortic ectasia ([facility])    a. 4.3cm ectatic ascending thoracic aorta by CT 06/2017.   . Type 2 diabetes mellitus ([place])    Past Surgical History:  Procedure Laterality Date  . CARDIAC CATHETERIZATION N/A 05/31/2016   Procedure: Left Heart Cath and Coronary Angiography;  Surgeon: Peter M Martinique, MD;  Location: [place] [facility];  Service: Cardiovascular;  Laterality: N/A;  . CARDIAC CATHETERIZATION N/A 05/31/2016   Procedure: Intravascular Pressure Wire/FFR Study;  Surgeon: Peter M Martinique, MD;  Location: [place] [facility];  Service: Cardiovascular;  Laterality: N/A;  . CARDIAC CATHETERIZATION N/A 05/31/2016   Procedure: Coronary Stent Intervention;  Surgeon: Peter M Martinique, MD;  Location: [place] [facility];  Service: Cardiovascular;  Laterality: N/A;  . LEFT HEART CATH AND CORONARY ANGIOGRAPHY N/A 12/08/2017   Procedure: LEFT HEART CATH AND CORONARY ANGIOGRAPHY;  Surgeon: Leonie Man, MD;  Location: [place] [facility];  Service: Cardiovascular;  Laterality: N/A;  . LEFT HEART CATHETERIZATION WITH CORONARY ANGIOGRAM N/A 02/03/2014   Procedure: LEFT HEART CATHETERIZATION WITH CORONARY ANGIOGRAM;  Surgeon: Pixie Casino, MD;  Location: [facility];  Service: Cardiovascular;  Laterality: N/A;  . left leg stent        Current Meds  Medication Sig  . Accu-Chek FastClix Lancets MISC 1 Device by Other route 2 (two) times daily.   . Albuterol Sulfate (PROAIR RESPICLICK) [id number] (90 Base) MCG/ACT AEPB Inhale 2 puffs into the lungs every 6 (six) hours as needed. (Patient taking differently: Inhale 2 puffs into the lungs every 6 (six) hours as needed (for breathing). )  . aspirin 81 MG chewable tablet Chew 1 tablet (81 mg total) by mouth daily.  Marland Kitchen atorvastatin (LIPITOR) 80 MG tablet Take 1 tablet (80 mg total) by mouth daily.  . carvedilol (COREG) 25 MG tablet Take 1 tablet (25 mg total) by mouth 2 (two) times daily with a meal.  . DULoxetine (CYMBALTA) 60 MG capsule Take 1 capsule (60 mg total) by mouth daily. Please request future refills from PCP.  . furosemide (LASIX) 40 MG tablet Take 1 tablet (40 mg total) by mouth 3 (three) times daily.  Marland Kitchen glipiZIDE (GLUCOTROL) 10 MG tablet Take 10 mg by mouth 2 (two) times daily.  . Insulin Degludec 200 UNIT/ML SOPN Inject 100 Units as directed at bedtime.  . Insulin Pen Needle 31G X 5 MM MISC Use 1 needle daily to inject insulin as prescribed  . isosorbide mononitrate (IMDUR) 30 MG 24 hr tablet Take 1 tablet (30 mg total) by mouth daily.  . nitroGLYCERIN (NITROSTAT) 0.4 MG SL tablet Place 1 tablet (0.4 mg total) under the tongue every 5 (five) minutes as needed for chest pain.  Marland Kitchen NOVOLOG FLEXPEN 100 UNIT/ML FlexPen Inject 16-22 Units into the skin 3 (three) times daily with meals. Per sliding scale  . oxyCODONE (ROXICODONE) 15 MG immediate release tablet Take 15 mg 3 (three) times daily as needed by mouth for pain.   . pantoprazole (PROTONIX) 40 MG tablet Take 40 mg by mouth daily.  . pregabalin (LYRICA) 300 MG capsule Take 300 mg by mouth 2 (two) times daily.  . sertraline (ZOLOFT) 50 MG tablet Take 50 mg by mouth daily.  Marland Kitchen tiZANidine (ZANAFLEX) 2 MG tablet Take 2 mg by mouth 2 (two) times daily  as needed for pain.  . traZODone (DESYREL) 50 MG tablet Take 1 tablet (50 mg total) by mouth at bedtime.  . Vitamin D, Ergocalciferol, (DRISDOL) 1.25 MG (50000 UT) CAPS capsule Take 50,000 Units by mouth once a week. Wednesdays  . XTAMPZA ER 27 MG C12A Take 27 mg by mouth 2 (two) times daily.     Allergies:   Coconut flavor [flavoring agent], Coconut oil, Ibuprofen, Aleve [naproxen], Nsaids, and Vascepa [icosapent ethyl]   Social History   Tobacco Use  . Smoking status: Never Smoker  . Smokeless tobacco: Never Used  Substance Use Topics  . Alcohol use: No  . Drug use: No     Family Hx: The patient's family history includes Breast cancer in his mother; Cancer in his father; Diabetes in his brother, mother, and sister; Hypertension in his brother, mother, and sister.  ROS:   Please  see the history of present illness.    No Fever, chills  or productive cough All other systems reviewed and are negative.   Recent Labs: 08/22/2019: B Natriuretic Peptide 58.1 08/23/2019: Hemoglobin 10.3; Magnesium 1.5; Platelets 238 10/17/2019: ALT 19; BUN 13; Creatinine, Ser 0.68; Potassium 3.9; Sodium 139   Recent Lipid Panel Lab Results  Component Value Date/Time   CHOL 163 10/17/2019 08:36 AM   TRIG 529 (H) 10/17/2019 08:36 AM   HDL 34 (L) 10/17/2019 08:36 AM   CHOLHDL 4.8 10/17/2019 08:36 AM   CHOLHDL 4.3 03/08/2019 10:08 AM   LDLCALC 51 10/17/2019 08:36 AM   LDLCALC  03/08/2019 10:08 AM     Comment:     . LDL cholesterol not calculated. Triglyceride levels greater than 400 mg/dL invalidate calculated LDL results. . Reference range: <100 . Desirable range <100 mg/dL for primary prevention;   <70 mg/dL for patients with CHD or diabetic patients  with > or = 2 CHD risk factors. Marland Kitchen LDL-C is now calculated using the Martin-Hopkins  calculation, which is a validated novel method providing  better accuracy than the Friedewald equation in the  estimation of LDL-C.  Cresenciano Genre et al.  Annamaria Helling. [id number]): 2061-2068  (http://education.[employer].com/faq/FAQ164)     Wt Readings from Last 3 Encounters:  01/14/20 (!) 312 lb (141.5 kg)  11/13/19 (!) 318 lb (144.2 kg)  11/04/19 (!) 318 lb (144.2 kg)     Objective:    Vital Signs:  BP 137/75   Pulse 65   Ht 5\' 10"  (1.778 m)   Wt (!) 312 lb (141.5 kg)   BMI 44.77 kg/m    VITAL SIGNS:  reviewed NAD Answers questions appropriately Normal affect Remainder of physical examination not performed (telehealth visit; coronavirus pandemic)  ASSESSMENT & PLAN:    1. Coronary artery disease-plan continue aspirin and statin. 2. Chest pain-patient has had chronic chest pain.  His symptoms are unchanged.  Most recent catheterization showed no obstructive coronary disease.  No further evaluation at this point. 3. Hypertension-blood pressure controlled.  Continue present medical regimen. 4. Chronic diastolic congestive heart failure-based on history his volume status is stable.  Continue present dose of Lasix, fluid restriction and low-sodium diet. 5. History of syncope-no recurrences.  LV function normal. 6. Carotid artery disease-mild on most recent Dopplers. 7. Morbid obesity-we discussed the importance of diet, exercise and weight loss. 8. Dyspnea-likely multifactorial including obesity hypoventilation syndrome, obstructive sleep apnea and chronic diastolic congestive heart failure.  COVID-19 Education: The importance of social distancing was discussed today.  Time:   Today, I have spent 16 minutes with the patient with telehealth technology discussing the above problems.     Medication Adjustments/Labs and Tests Ordered: Current medicines are reviewed at length with the patient today.  Concerns regarding medicines are outlined above.   Tests Ordered: No orders of the defined types were placed in this encounter.   Medication Changes: No orders of the defined types were placed in this encounter.   Follow Up:   Either In Person or Virtual in 6 month(s)  Signed, Kirk Ruths, MD  01/14/2020 8:03 AM    [place]

## 2020-01-10 ENCOUNTER — Other Ambulatory Visit: Payer: Self-pay | Admitting: Orthopedic Surgery

## 2020-01-10 DIAGNOSIS — M67911 Unspecified disorder of synovium and tendon, right shoulder: Secondary | ICD-10-CM

## 2020-01-10 DIAGNOSIS — M25511 Pain in right shoulder: Secondary | ICD-10-CM

## 2020-01-14 ENCOUNTER — Encounter: Payer: Self-pay | Admitting: Cardiology

## 2020-01-14 ENCOUNTER — Telehealth (INDEPENDENT_AMBULATORY_CARE_PROVIDER_SITE_OTHER): Payer: Medicaid Other | Admitting: Cardiology

## 2020-01-14 ENCOUNTER — Telehealth: Payer: Self-pay | Admitting: *Deleted

## 2020-01-14 VITALS — BP 137/75 | HR 65 | Ht 70.0 in | Wt 312.0 lb

## 2020-01-14 DIAGNOSIS — E785 Hyperlipidemia, unspecified: Secondary | ICD-10-CM

## 2020-01-14 DIAGNOSIS — I2510 Atherosclerotic heart disease of native coronary artery without angina pectoris: Secondary | ICD-10-CM

## 2020-01-14 DIAGNOSIS — I110 Hypertensive heart disease with heart failure: Secondary | ICD-10-CM

## 2020-01-14 DIAGNOSIS — I10 Essential (primary) hypertension: Secondary | ICD-10-CM

## 2020-01-14 DIAGNOSIS — I5032 Chronic diastolic (congestive) heart failure: Secondary | ICD-10-CM | POA: Diagnosis not present

## 2020-01-14 NOTE — Patient Instructions (Signed)
Medication Instructions:  No changes *If you need a refill on your cardiac medications before your next appointment, please call your pharmacy*   Lab Work: Not needed    Testing/Procedures: Not needed   Follow-Up: At [facility], you and your health needs are our priority.  As part of our continuing mission to provide you with exceptional heart care, we have created designated Provider Care Teams.  These Care Teams include your primary Cardiologist (physician) and Advanced Practice Providers (APPs -  Physician Assistants and Nurse Practitioners) who all work together to provide you with the care you need, when you need it.  We recommend signing up for the patient portal called "MyChart".  Sign up information is provided on this After Visit Summary.  MyChart is used to connect with patients for Virtual Visits (Telemedicine).  Patients are able to view lab/test results, encounter notes, upcoming appointments, etc.  Non-urgent messages can be sent to your provider as well.   To learn more about what you can do with MyChart, go to [email or web address].    Your next appointment:   6 month(s)  The format for your next appointment:   In Person  Provider:   Kirk Ruths, MD   Other Instructions

## 2020-01-14 NOTE — Telephone Encounter (Signed)
RN spoke to patient. Instruction were given  from today's virtual visit 01/14/20.  AVS SUMMARY has been mailed .   Patient verbalized understanding

## 2020-02-08 ENCOUNTER — Ambulatory Visit
Admission: RE | Admit: 2020-02-08 | Discharge: 2020-02-08 | Disposition: A | Discharge status: Home / Self Care | Payer: Medicaid Other | Source: Ambulatory Visit | Attending: Orthopedic Surgery | Admitting: Orthopedic Surgery

## 2020-02-08 DIAGNOSIS — M25511 Pain in right shoulder: Secondary | ICD-10-CM

## 2020-02-08 DIAGNOSIS — M67911 Unspecified disorder of synovium and tendon, right shoulder: Secondary | ICD-10-CM

## 2020-02-10 LAB — LIPID PANEL
Chol/HDL Ratio: 3.4 ratio (ref 0.0–5.0)
Cholesterol, Total: 113 mg/dL (ref 100–199)
HDL: 33 mg/dL — ABNORMAL LOW (ref >39)
LDL Chol Calc (NIH): 32 mg/dL (ref 0–99)
Triglycerides: 325 mg/dL — ABNORMAL HIGH (ref 0–149)
VLDL Cholesterol Cal: 48 mg/dL — ABNORMAL HIGH (ref 5–40)

## 2020-02-10 LAB — LDL CHOLESTEROL, DIRECT: LDL Direct: 48 mg/dL (ref 0–99)

## 2020-02-12 ENCOUNTER — Ambulatory Visit (INDEPENDENT_AMBULATORY_CARE_PROVIDER_SITE_OTHER): Payer: Medicaid Other | Admitting: Internal Medicine

## 2020-02-12 ENCOUNTER — Encounter: Payer: Self-pay | Admitting: Internal Medicine

## 2020-02-12 ENCOUNTER — Other Ambulatory Visit: Payer: Self-pay

## 2020-02-12 VITALS — BP 104/68 | HR 72 | Ht 70.0 in | Wt 328.0 lb

## 2020-02-12 DIAGNOSIS — E785 Hyperlipidemia, unspecified: Secondary | ICD-10-CM | POA: Diagnosis not present

## 2020-02-12 DIAGNOSIS — E1165 Type 2 diabetes mellitus with hyperglycemia: Secondary | ICD-10-CM

## 2020-02-12 MED ORDER — FENOFIBRATE 145 MG PO TABS: 145.0000 mg | ORAL_TABLET | Freq: Every day | ORAL | 3 refills | Status: DC

## 2020-02-12 MED ORDER — ATORVASTATIN CALCIUM 40 MG PO TABS: 40.0000 mg | ORAL_TABLET | Freq: Every day | ORAL | 3 refills | Status: DC

## 2020-02-12 NOTE — Progress Notes (Signed)
LIPID OFFICE NOTE  Chief Complaint:  Follow-up lipids  Primary Care Physician: [name], FNP  Primary Cardiologist:  Kirk Ruths, MD  HPI:  [name] is a 59 y.o. male who is being seen today for the evaluation of hypertriglyceridemia at the request of [name], FNP. [name] is a pleasant 59 year old male patient who I previously took care of back in 2015.  In fact he underwent cardiac catheterization by myself which showed just moderate LAD disease at the time.  He had been managed medically for this.  Other comorbidities include morbid obesity, insulin-dependent diabetes with recently worse blood sugar controlled hemoglobin A1c 9.8.  He has dyslipidemia with persistently elevated triglycerides which has ranged between 202 and 250 on treatment with atorvastatin 80 mg, however recently a repeat lipid profile showed markdly elevated triglycerides near 600.  Over the past year he is suffered from chronic pain and is received steroids, he also has a history of pancreatitis in the past.  Is not clear whether this was related to elevated triglycerides or not.  He ultimately had progression of coronary disease and received a stent to the LAD in August 2017 and transition care over to Dr. Stanford Breed.  He is currently referred today for evaluation and management of elevated triglycerides.   08/24/2020  [name] is seen today in follow-up.  Over the summer he was hospitalized for DKA/hyperglycemia.  He was hypotensive and he was concerned about possible anaphylaxis as he had just started Vascepa.  He also mentioned that he thought that it increased his blood sugar however there is little evidence of this.  According to the hospitalist physician Dr. Broadus John, the medication was held pending follow-up with me.  Subsequently had been admitted several times with hyperglycemia, infection and other issues including chest pain.  Hemoglobin A1c is 0 poorly controlled at 11.6 despite  being on insulin at this point.  Triglycerides again were rechecked at 529.  02/12/2020  [name] returns for follow-up.  He has had some improvement in his triglycerides.  Recent lipids show total cholesterol 113, triglycerides 325, HDL 33 and LDL of 32.  Triglycerides are down from 529 and the LDL is further decreased from 51.  He reports some improvement in his glycemic control.  PMHx:  Past Medical History:  Diagnosis Date  . Anemia   . Arthritis   . Back pain   . CAD (coronary artery disease)    a. s/p DES to LAD in 05/2016  . Cervical radiculopathy   . Chronic diastolic CHF (congestive heart failure) ([place])   . Chronic pain   . Depression   . DVT (deep venous thrombosis) ([place])   . Hematemesis   . Hepatic steatosis   . Hyperlipidemia   . Hypertension   . IBS (irritable bowel syndrome)   . Morbid obesity ([place])   . OSA (obstructive sleep apnea)   . Pancreatitis   . PE (pulmonary thromboembolism) ([place])   . Peripheral neuropathy   . PUD (peptic ulcer disease)   . Renal disorder   . Stroke [facility])    a. ?details unclear - not seen on imaging when he was admitted in 05/2017 for TIA symptoms which were felt due to cervical radiculopathy.  . Thoracic aortic ectasia ([facility])    a. 4.3cm ectatic ascending thoracic aorta by CT 06/2017.   . Type 2 diabetes mellitus ([place])     Past Surgical History:  Procedure Laterality Date  . CARDIAC CATHETERIZATION N/A 05/31/2016  Procedure: Left Heart Cath and Coronary Angiography;  Surgeon: Peter M Martinique, MD;  Location: [place] [facility];  Service: Cardiovascular;  Laterality: N/A;  . CARDIAC CATHETERIZATION N/A 05/31/2016   Procedure: Intravascular Pressure Wire/FFR Study;  Surgeon: Peter M Martinique, MD;  Location: [place] [facility];  Service: Cardiovascular;  Laterality: N/A;  . CARDIAC CATHETERIZATION N/A 05/31/2016   Procedure: Coronary Stent Intervention;  Surgeon: Peter M Martinique, MD;  Location: [place] [facility];  Service: Cardiovascular;   Laterality: N/A;  . LEFT HEART CATH AND CORONARY ANGIOGRAPHY N/A 12/08/2017   Procedure: LEFT HEART CATH AND CORONARY ANGIOGRAPHY;  Surgeon: Leonie Man, MD;  Location: [place] [facility];  Service: Cardiovascular;  Laterality: N/A;  . LEFT HEART CATHETERIZATION WITH CORONARY ANGIOGRAM N/A 02/03/2014   Procedure: LEFT HEART CATHETERIZATION WITH CORONARY ANGIOGRAM;  Surgeon: Pixie Casino, MD;  [facility];  Service: Cardiovascular;  Laterality: N/A;  . left leg stent       FAMHx:  Family History  Problem Relation Age of Onset  . Cancer Father   . Hypertension Mother   . Diabetes Mother   . Breast cancer Mother   . Hypertension Brother   . Diabetes Brother   . Hypertension Sister   . Diabetes Sister     SOCHx:   reports that he has never smoked. He has never used smokeless tobacco. He reports that he does not drink alcohol or use drugs.  ALLERGIES:  Allergies  Allergen Reactions  . Coconut Flavor [Flavoring Agent] Hives  . Coconut Oil Hives  . Ibuprofen Other (See Comments)    Made gastric ulcers worse  . Aleve [Naproxen] Other (See Comments)    DUE TO KIDNEYS  . Nsaids Other (See Comments)    Stomach ulcers   . Vascepa [Icosapent Ethyl] Other (See Comments)    headaches, chest pain, similar to sx of a stroke, hypotension     ROS: Pertinent items noted in HPI and remainder of comprehensive ROS otherwise negative.  HOME MEDS: Current Outpatient Medications on File Prior to Visit  Medication Sig Dispense Refill  . Accu-Chek FastClix Lancets MISC 1 Device by Other route 2 (two) times daily.     . Albuterol Sulfate (PROAIR RESPICLICK) 123XX123 (90 Base) MCG/ACT AEPB Inhale 2 puffs into the lungs every 6 (six) hours as needed. (Patient taking differently: Inhale 2 puffs into the lungs every 6 (six) hours as needed (for breathing). ) 1 each 0  . aspirin 81 MG chewable tablet Chew 1 tablet (81 mg total) by mouth daily. 30 tablet 0  . atorvastatin (LIPITOR) 80 MG  tablet Take 1 tablet (80 mg total) by mouth daily. 90 tablet 3  . carvedilol (COREG) 25 MG tablet Take 1 tablet (25 mg total) by mouth 2 (two) times daily with a meal. 180 tablet 3  . DULoxetine (CYMBALTA) 60 MG capsule Take 1 capsule (60 mg total) by mouth daily. Please request future refills from PCP. 30 capsule 1  . furosemide (LASIX) 40 MG tablet Take 1 tablet (40 mg total) by mouth 3 (three) times daily. 90 tablet 0  . glipiZIDE (GLUCOTROL) 10 MG tablet Take 10 mg by mouth 2 (two) times daily.  4  . Insulin Degludec 200 UNIT/ML SOPN Inject 100 Units as directed at bedtime.    . Insulin Pen Needle 31G X 5 MM MISC Use 1 needle daily to inject insulin as prescribed 100 each 2  . isosorbide mononitrate (IMDUR) 30 MG 24 hr  tablet Take 1 tablet (30 mg total) by mouth daily. 90 tablet 3  . nitroGLYCERIN (NITROSTAT) 0.4 MG SL tablet Place 1 tablet (0.4 mg total) under the tongue every 5 (five) minutes as needed for chest pain. 100 tablet 1  . NOVOLOG FLEXPEN 100 UNIT/ML FlexPen Inject 16-22 Units into the skin 3 (three) times daily with meals. Per sliding scale    . oxyCODONE (ROXICODONE) 15 MG immediate release tablet Take 15 mg 3 (three) times daily as needed by mouth for pain.   0  . pantoprazole (PROTONIX) 40 MG tablet Take 40 mg by mouth daily.    . pregabalin (LYRICA) 300 MG capsule Take 300 mg by mouth 2 (two) times daily.    . sertraline (ZOLOFT) 50 MG tablet Take 50 mg by mouth daily.    Marland Kitchen tiZANidine (ZANAFLEX) 2 MG tablet Take 2 mg by mouth 2 (two) times daily as needed for pain.    . traZODone (DESYREL) 50 MG tablet Take 1 tablet (50 mg total) by mouth at bedtime. 30 tablet 11  . Vitamin D, Ergocalciferol, (DRISDOL) 1.25 MG (50000 UT) CAPS capsule Take 50,000 Units by mouth once a week. Wednesdays    . XTAMPZA ER 27 MG C12A Take 27 mg by mouth 2 (two) times daily.     No current facility-administered medications on file prior to visit.    LABS/IMAGING: No results found for this or any  previous visit (from the past 48 hour(s)). No results found.  LIPID PANEL:    Component Value Date/Time   CHOL 113 02/10/2020 0929   TRIG 325 (H) 02/10/2020 0929   HDL 33 (L) 02/10/2020 0929   CHOLHDL 3.4 02/10/2020 0929   CHOLHDL 4.3 03/08/2019 1008   VLDL 43 (H) 12/08/2017 0212   LDLCALC 32 02/10/2020 0929   LDLCALC  03/08/2019 1008     Comment:     . LDL cholesterol not calculated. Triglyceride levels greater than 400 mg/dL invalidate calculated LDL results. . Reference range: <100 . Desirable range <100 mg/dL for primary prevention;   <70 mg/dL for patients with CHD or diabetic patients  with > or = 2 CHD risk factors. Marland Kitchen LDL-C is now calculated using the Martin-Hopkins  calculation, which is a validated novel method providing  better accuracy than the Friedewald equation in the  estimation of LDL-C.  Cresenciano Genre et al. Annamaria Helling. [id number]): 2061-2068  (http://education.[employer].com/faq/FAQ164)    LDLDIRECT 48 02/10/2020 0929    WEIGHTS: Wt Readings from Last 3 Encounters:  02/12/20 (!) 328 lb (148.8 kg)  01/14/20 (!) 312 lb (141.5 kg)  11/13/19 (!) 318 lb (144.2 kg)    VITALS: BP 104/68   Pulse 72   Ht 5\' 10"  (1.778 m)   Wt (!) 328 lb (148.8 kg)   SpO2 91%   BMI 47.06 kg/m   EXAM: Deferred  EKG: Deferred  ASSESSMENT: 1. Coronary artery disease status post PCI to the LAD (2017) 2. Insulin-dependent diabetes-uncontrolled, hemoglobin A1c 11.6 3. Morbid obesity with recent weight loss 4. Hypertension 5. Mixed dyslipidemia with very high triglycerides 6. Intermittent prescription steroid use 7. History of pancreatitis  PLAN: 1.   [name] reports his glycemic control has improved with hemoglobin A1c around 10 and coming down.  This is likely responsible for a lot of his elevated triglycerides.  Unfortunately he never got his fenofibrate to start taking but his triglycerides have improved somewhat.  I suspect they will improve significantly if  we start him on that.  We will  again send that out.  Because of the possible interaction with high potency atorvastatin and the fact that his LDL is still low, I recommend decreasing the 80 mg to 40 mg dose in combination with fenofibrate.  Plan follow-up with me in 6 months with lipids.  Pixie Casino, MD, [facility], [place] Director of the Advanced Lipid Disorders &  Cardiovascular Risk Reduction Clinic Diplomate of the American Board of Clinical Lipidology Attending Cardiologist  Direct Dial: [phone]  Fax: [phone]  Website:  [email or web address] 02/12/2020, 11:10 AM

## 2020-02-12 NOTE — Patient Instructions (Signed)
Medication Instructions:  DECREASE atorvastatin to 40mg  daily START fenofibrate 145mg  daily Continue other current medications  *If you need a refill on your cardiac medications before your next appointment, please call your pharmacy*   Lab Work: FASTING lab wok in 6 months to check cholesterol   If you have labs (blood work) drawn today and your tests are completely normal, you will receive your results only by: Marland Kitchen MyChart Message (if you have MyChart) OR . A paper copy in the mail If you have any lab test that is abnormal or we need to change your treatment, we will call you to review the results.   Testing/Procedures: NONE   Follow-Up: At [facility], you and your health needs are our priority.  As part of our continuing mission to provide you with exceptional heart care, we have created designated Provider Care Teams.  These Care Teams include your primary Cardiologist (physician) and Advanced Practice Providers (APPs -  Physician Assistants and Nurse Practitioners) who all work together to provide you with the care you need, when you need it.  We recommend signing up for the patient portal called "MyChart".  Sign up information is provided on this After Visit Summary.  MyChart is used to connect with patients for Virtual Visits (Telemedicine).  Patients are able to view lab/test results, encounter notes, upcoming appointments, etc.  Non-urgent messages can be sent to your provider as well.   To learn more about what you can do with MyChart, go to [email or web address].    Your next appointment:   6 month(s) - lipid clinic  The format for your next appointment:   Either In Person or Virtual  Provider:   K. Mali Hilty, MD   Other Instructions

## 2020-03-03 ENCOUNTER — Other Ambulatory Visit: Payer: Self-pay | Admitting: Internal Medicine

## 2020-03-03 MED ORDER — FENOFIBRATE 145 MG PO TABS: 145.0000 mg | ORAL_TABLET | Freq: Every day | ORAL | 3 refills | Status: DC

## 2020-03-03 NOTE — Telephone Encounter (Signed)
*  STAT* If patient is at the pharmacy, call can be transferred to refill team.   1. Which medications need to be refilled? (please list name of each medication and dose if known) fenofibrate (TRICOR) 145 MG tablet  2. Which pharmacy/location (including street and city if local pharmacy) is medication to be sent to?  [place], [place]  3. Do they need a 30 day or 90 day supply? 30  This branch did not receive the actual RX. The rx was sent to the wrong branch of [facility] makes 30 day bubble packs for the patient.

## 2020-04-27 ENCOUNTER — Inpatient Hospital Stay (HOSPITAL_COMMUNITY)
Admission: EM | Admit: 2020-04-27 | Discharge: 2020-04-30 | DRG: 378 | Disposition: A | Discharge status: Home / Self Care | Payer: Medicaid Other | Attending: Internal Medicine | Admitting: Internal Medicine

## 2020-04-27 ENCOUNTER — Encounter (HOSPITAL_COMMUNITY): Payer: Self-pay

## 2020-04-27 ENCOUNTER — Emergency Department (HOSPITAL_COMMUNITY): Payer: Medicaid Other

## 2020-04-27 DIAGNOSIS — Z6841 Body Mass Index (BMI) 40.0 and over, adult: Secondary | ICD-10-CM

## 2020-04-27 DIAGNOSIS — Z794 Long term (current) use of insulin: Secondary | ICD-10-CM

## 2020-04-27 DIAGNOSIS — E785 Hyperlipidemia, unspecified: Secondary | ICD-10-CM | POA: Diagnosis present

## 2020-04-27 DIAGNOSIS — M1990 Unspecified osteoarthritis, unspecified site: Secondary | ICD-10-CM | POA: Diagnosis present

## 2020-04-27 DIAGNOSIS — I160 Hypertensive urgency: Secondary | ICD-10-CM | POA: Diagnosis present

## 2020-04-27 DIAGNOSIS — Z20822 Contact with and (suspected) exposure to covid-19: Secondary | ICD-10-CM | POA: Diagnosis present

## 2020-04-27 DIAGNOSIS — G4733 Obstructive sleep apnea (adult) (pediatric): Secondary | ICD-10-CM | POA: Diagnosis present

## 2020-04-27 DIAGNOSIS — Z803 Family history of malignant neoplasm of breast: Secondary | ICD-10-CM

## 2020-04-27 DIAGNOSIS — Z7982 Long term (current) use of aspirin: Secondary | ICD-10-CM

## 2020-04-27 DIAGNOSIS — K922 Gastrointestinal hemorrhage, unspecified: Secondary | ICD-10-CM | POA: Diagnosis present

## 2020-04-27 DIAGNOSIS — Z955 Presence of coronary angioplasty implant and graft: Secondary | ICD-10-CM

## 2020-04-27 DIAGNOSIS — E782 Mixed hyperlipidemia: Secondary | ICD-10-CM | POA: Diagnosis present

## 2020-04-27 DIAGNOSIS — Z8249 Family history of ischemic heart disease and other diseases of the circulatory system: Secondary | ICD-10-CM

## 2020-04-27 DIAGNOSIS — K635 Polyp of colon: Secondary | ICD-10-CM | POA: Diagnosis present

## 2020-04-27 DIAGNOSIS — K219 Gastro-esophageal reflux disease without esophagitis: Secondary | ICD-10-CM | POA: Diagnosis present

## 2020-04-27 DIAGNOSIS — E1169 Type 2 diabetes mellitus with other specified complication: Secondary | ICD-10-CM | POA: Diagnosis present

## 2020-04-27 DIAGNOSIS — Z86711 Personal history of pulmonary embolism: Secondary | ICD-10-CM

## 2020-04-27 DIAGNOSIS — I5032 Chronic diastolic (congestive) heart failure: Secondary | ICD-10-CM | POA: Diagnosis present

## 2020-04-27 DIAGNOSIS — Z833 Family history of diabetes mellitus: Secondary | ICD-10-CM

## 2020-04-27 DIAGNOSIS — Z888 Allergy status to other drugs, medicaments and biological substances status: Secondary | ICD-10-CM

## 2020-04-27 DIAGNOSIS — I110 Hypertensive heart disease with heart failure: Secondary | ICD-10-CM | POA: Diagnosis present

## 2020-04-27 DIAGNOSIS — Z79899 Other long term (current) drug therapy: Secondary | ICD-10-CM

## 2020-04-27 DIAGNOSIS — F329 Major depressive disorder, single episode, unspecified: Secondary | ICD-10-CM | POA: Diagnosis present

## 2020-04-27 DIAGNOSIS — K5730 Diverticulosis of large intestine without perforation or abscess without bleeding: Secondary | ICD-10-CM | POA: Diagnosis present

## 2020-04-27 DIAGNOSIS — Z886 Allergy status to analgesic agent status: Secondary | ICD-10-CM

## 2020-04-27 DIAGNOSIS — E1151 Type 2 diabetes mellitus with diabetic peripheral angiopathy without gangrene: Secondary | ICD-10-CM | POA: Diagnosis present

## 2020-04-27 DIAGNOSIS — K2901 Acute gastritis with bleeding: Principal | ICD-10-CM | POA: Diagnosis present

## 2020-04-27 DIAGNOSIS — I2510 Atherosclerotic heart disease of native coronary artery without angina pectoris: Secondary | ICD-10-CM | POA: Diagnosis present

## 2020-04-27 DIAGNOSIS — D509 Iron deficiency anemia, unspecified: Secondary | ICD-10-CM | POA: Diagnosis present

## 2020-04-27 DIAGNOSIS — M5412 Radiculopathy, cervical region: Secondary | ICD-10-CM | POA: Diagnosis present

## 2020-04-27 DIAGNOSIS — E1165 Type 2 diabetes mellitus with hyperglycemia: Secondary | ICD-10-CM | POA: Diagnosis present

## 2020-04-27 DIAGNOSIS — Z91018 Allergy to other foods: Secondary | ICD-10-CM

## 2020-04-27 DIAGNOSIS — K640 First degree hemorrhoids: Secondary | ICD-10-CM | POA: Diagnosis present

## 2020-04-27 DIAGNOSIS — Z86718 Personal history of other venous thrombosis and embolism: Secondary | ICD-10-CM

## 2020-04-27 LAB — COMPREHENSIVE METABOLIC PANEL
ALT: 18 U/L (ref 0–44)
AST: 22 U/L (ref 15–41)
Albumin: 3.5 g/dL (ref 3.5–5.0)
Alkaline Phosphatase: 54 U/L (ref 38–126)
Anion gap: 18 — ABNORMAL HIGH (ref 5–15)
BUN: 12 mg/dL (ref 6–20)
CO2: 18 mmol/L — ABNORMAL LOW (ref 22–32)
Calcium: 9.1 mg/dL (ref 8.9–10.3)
Chloride: 99 mmol/L (ref 98–111)
Creatinine, Ser: 0.9 mg/dL (ref 0.61–1.24)
GFR calc Af Amer: >60 mL/min (ref >60)
GFR calc non Af Amer: >60 mL/min (ref >60)
Glucose, Bld: 311 mg/dL — ABNORMAL HIGH (ref 70–99)
Potassium: 3.9 mmol/L (ref 3.5–5.1)
Sodium: 135 mmol/L (ref 135–145)
Total Bilirubin: 0.6 mg/dL (ref 0.3–1.2)
Total Protein: 6.5 g/dL (ref 6.5–8.1)

## 2020-04-27 LAB — I-STAT VENOUS BLOOD GAS, ED
Acid-Base Excess: 4 mmol/L — ABNORMAL HIGH (ref 0.0–2.0)
Bicarbonate: 29 mmol/L — ABNORMAL HIGH (ref 20.0–28.0)
Calcium, Ion: 1.13 mmol/L — ABNORMAL LOW (ref 1.15–1.40)
HCT: 38 % — ABNORMAL LOW (ref 39.0–52.0)
Hemoglobin: 12.9 g/dL — ABNORMAL LOW (ref 13.0–17.0)
O2 Saturation: 89 %
Potassium: 3.7 mmol/L (ref 3.5–5.1)
Sodium: 139 mmol/L (ref 135–145)
TCO2: 30 mmol/L (ref 22–32)
pCO2, Ven: 45.5 mmHg (ref 44.0–60.0)
pH, Ven: 7.413 (ref 7.250–7.430)
pO2, Ven: 56 mmHg — ABNORMAL HIGH (ref 32.0–45.0)

## 2020-04-27 LAB — BETA-HYDROXYBUTYRIC ACID: Beta-Hydroxybutyric Acid: 0.44 mmol/L — ABNORMAL HIGH (ref 0.05–0.27)

## 2020-04-27 LAB — CBC
HCT: 35.8 % — ABNORMAL LOW (ref 39.0–52.0)
Hemoglobin: 11.2 g/dL — ABNORMAL LOW (ref 13.0–17.0)
MCH: 27.3 pg (ref 26.0–34.0)
MCHC: 31.3 g/dL (ref 30.0–36.0)
MCV: 87.3 fL (ref 80.0–100.0)
Platelets: 217 10*3/uL (ref 150–400)
RBC: 4.1 MIL/uL — ABNORMAL LOW (ref 4.22–5.81)
RDW: 14.9 % (ref 11.5–15.5)
WBC: 8.3 10*3/uL (ref 4.0–10.5)
nRBC: 0 % (ref 0.0–0.2)

## 2020-04-27 LAB — TYPE AND SCREEN
ABO/RH(D): O POS
Antibody Screen: NEGATIVE

## 2020-04-27 LAB — POC OCCULT BLOOD, ED: Fecal Occult Bld: POSITIVE — AB

## 2020-04-27 MED ORDER — PANTOPRAZOLE SODIUM 40 MG IV SOLR
40.0000 mg | Freq: Two times a day (BID) | INTRAVENOUS | Status: DC
  Administered 2020-04-28 – 2020-04-30 (×5): 40 mg via INTRAVENOUS
  Filled 2020-04-27 (×5): qty 40

## 2020-04-27 MED ORDER — INSULIN ASPART 100 UNIT/ML ~~LOC~~ SOLN
0.0000 [IU] | SUBCUTANEOUS | Status: DC
  Administered 2020-04-28 (×2): 7 [IU] via SUBCUTANEOUS
  Administered 2020-04-28: 4 [IU] via SUBCUTANEOUS
  Administered 2020-04-28 – 2020-04-29 (×4): 7 [IU] via SUBCUTANEOUS
  Administered 2020-04-29: 11 [IU] via SUBCUTANEOUS
  Administered 2020-04-29: 7 [IU] via SUBCUTANEOUS
  Administered 2020-04-29: 3 [IU] via SUBCUTANEOUS
  Administered 2020-04-30 (×2): 4 [IU] via SUBCUTANEOUS

## 2020-04-27 MED ORDER — SODIUM CHLORIDE 0.9 % IV BOLUS
500.0000 mL | Freq: Once | INTRAVENOUS | Status: AC
  Administered 2020-04-28: 500 mL via INTRAVENOUS

## 2020-04-27 MED ORDER — HYDROMORPHONE HCL 1 MG/ML IJ SOLN
1.0000 mg | Freq: Once | INTRAMUSCULAR | Status: AC
  Administered 2020-04-28: 1 mg via INTRAVENOUS
  Filled 2020-04-27: qty 1

## 2020-04-27 MED ORDER — IOHEXOL 300 MG/ML  SOLN
100.0000 mL | Freq: Once | INTRAMUSCULAR | Status: AC | PRN
  Administered 2020-04-27: 100 mL via INTRAVENOUS

## 2020-04-27 MED ORDER — INSULIN GLARGINE 100 UNIT/ML ~~LOC~~ SOLN
140.0000 [IU] | Freq: Every day | SUBCUTANEOUS | Status: DC
  Administered 2020-04-28 – 2020-04-29 (×3): 140 [IU] via SUBCUTANEOUS
  Filled 2020-04-27 (×5): qty 1.4

## 2020-04-27 MED ORDER — ONDANSETRON 4 MG PO TBDP
4.0000 mg | ORAL_TABLET | Freq: Once | ORAL | Status: AC
  Administered 2020-04-27: 4 mg via ORAL
  Filled 2020-04-27: qty 1

## 2020-04-27 MED ORDER — SODIUM CHLORIDE 0.9 % IV SOLN: INTRAVENOUS | Status: DC

## 2020-04-27 MED ORDER — SODIUM CHLORIDE 0.9 % IV SOLN
80.0000 mg | Freq: Once | INTRAVENOUS | Status: AC
  Administered 2020-04-27: 80 mg via INTRAVENOUS
  Filled 2020-04-27: qty 80

## 2020-04-27 NOTE — ED Provider Notes (Signed)
Emergency Department Provider Note   I have reviewed the triage vital signs and the nursing notes.   HISTORY  Chief Complaint GI Bleeding   HPI [name] is a 59 y.o. male with past medical history reviewed below presents to the emergency department for evaluation of blood in the bowel movements as well as vomiting blood.  Patient has been experiencing symptoms over the past 2 weeks.  He describes a bandlike discomfort across his abdomen with no clear modifying factors.  Pain does not radiate.  He reports nausea vomiting.  He has been referred to [facility] and has an appointment with them on July 13th but was advised by his primary care doctor if symptoms worsen he should present to the emergency department.  Has not been feeling short of breath or lightheaded.  He has had swelling and pain in both lower extremities and has been compliant with his medications for CHF.  Patient does take aspirin daily but is not anticoagulated.  He is not experiencing fevers or chills.  Blood in the stool is sometimes dark red and occasionally black.  He notes with vomiting blood he describes it as a pinkish tinge.  Denies any coffee-ground emesis.  No chest pain or shortness of breath symptoms.  Past Medical History:  Diagnosis Date  . Anemia   . Arthritis   . Back pain   . CAD (coronary artery disease)    a. s/p DES to LAD in 05/2016  . Cervical radiculopathy   . Chronic diastolic CHF (congestive heart failure) ([place])   . Chronic pain   . Depression   . DVT (deep venous thrombosis) ([place])   . Hematemesis   . Hepatic steatosis   . Hyperlipidemia   . Hypertension   . IBS (irritable bowel syndrome)   . Morbid obesity ([place])   . OSA (obstructive sleep apnea)   . Pancreatitis   . PE (pulmonary thromboembolism) ([place])   . Peripheral neuropathy   . PUD (peptic ulcer disease)   . Renal disorder   . Stroke [facility])    a. ?details unclear - not seen on imaging when he was admitted in 05/2017 for TIA  symptoms which were felt due to cervical radiculopathy.  . Thoracic aortic ectasia (HCC)    a. 4.3cm ectatic ascending thoracic aorta by CT 06/2017.   . Type 2 diabetes mellitus [facility])     Patient Active Problem List   Diagnosis Date Noted  . Acute on chronic diastolic CHF (congestive heart failure) ([place]) 08/23/2019  . ARF (acute renal failure) ([place]) 08/01/2019  . Hyperkalemia 08/01/2019  . Uncontrolled type 2 diabetes mellitus with hyperglycemia ([place]) 08/01/2019  . AKI (acute kidney injury) ([place])   . Hypotension 07/31/2019  . DKA (diabetic ketoacidoses) ([place]) 05/05/2019  . Blurred vision 05/05/2019  . Alteration consciousness 11/19/2018  . Neck pain 10/09/2018  . Left arm weakness 10/09/2018  . B12 deficiency 08/10/2017  . Persistent headaches 08/09/2017  . GERD (gastroesophageal reflux disease) 06/29/2017  . Left arm numbness   . Cerebral embolism with cerebral infarction 06/17/2017  . TIA (transient ischemic attack) 06/17/2017  . Chronic back pain 12/29/2016  . Depression 12/15/2016  . Vitamin D deficiency 12/09/2016  . Right hip pain 12/07/2016  . Hypokalemia 12/07/2016  . Type 2 diabetes mellitus with vascular disease ([place]) 05/31/2016  . Normocytic normochromic anemia 05/31/2016  . Chest pain 05/31/2016  . CAD (coronary artery disease) 01/06/2016  . DVT (deep venous thrombosis) ([place]) 01/06/2016  .  Lactic acidosis 05/28/2014  . Nonspecific chest pain 01/29/2014  . Uncontrolled secondary diabetes with peripheral neuropathy ([place]) 01/29/2014  . Obesity, Class III, BMI 40-49.9 (morbid obesity) ([place]) 01/29/2014  . Snoring 01/29/2014  . Dyslipidemia 01/29/2014  . HTN (hypertension) 01/29/2014  . Abnormal nuclear stress test 01/29/2014    Past Surgical History:  Procedure Laterality Date  . CARDIAC CATHETERIZATION N/A 05/31/2016   Procedure: Left Heart Cath and Coronary Angiography;  Surgeon: Peter M Martinique, MD;  Location: [place] [facility];  Service: Cardiovascular;   Laterality: N/A;  . CARDIAC CATHETERIZATION N/A 05/31/2016   Procedure: Intravascular Pressure Wire/FFR Study;  Surgeon: Peter M Martinique, MD;  Location: [place] [facility];  Service: Cardiovascular;  Laterality: N/A;  . CARDIAC CATHETERIZATION N/A 05/31/2016   Procedure: Coronary Stent Intervention;  Surgeon: Peter M Martinique, MD;  Location: [place] [facility];  Service: Cardiovascular;  Laterality: N/A;  . LEFT HEART CATH AND CORONARY ANGIOGRAPHY N/A 12/08/2017   Procedure: LEFT HEART CATH AND CORONARY ANGIOGRAPHY;  Surgeon: Leonie Man, MD;  Location: [place] [facility];  Service: Cardiovascular;  Laterality: N/A;  . LEFT HEART CATHETERIZATION WITH CORONARY ANGIOGRAM N/A 02/03/2014   Procedure: LEFT HEART CATHETERIZATION WITH CORONARY ANGIOGRAM;  Surgeon: Pixie Casino, MD;  Location: [facility] CATH LAB;  Service: Cardiovascular;  Laterality: N/A;  . left leg stent       Allergies Coconut flavor [flavoring agent], Coconut oil, Ibuprofen, Aleve [naproxen], Nsaids, and Vascepa [icosapent ethyl]  Family History  Problem Relation Age of Onset  . Cancer Father   . Hypertension Mother   . Diabetes Mother   . Breast cancer Mother   . Hypertension Brother   . Diabetes Brother   . Hypertension Sister   . Diabetes Sister     Social History Social History   Tobacco Use  . Smoking status: Never Smoker  . Smokeless tobacco: Never Used  Vaping Use  . Vaping Use: Never used  Substance Use Topics  . Alcohol use: No  . Drug use: No    Review of Systems  Constitutional: No fever/chills Eyes: No visual changes. ENT: No sore throat. Cardiovascular: Denies chest pain. B/L LE swelling and pain.  Respiratory: Denies shortness of breath. Gastrointestinal: Positive abdominal pain with nausea, vomiting, and blood in the emesis and stool.  Genitourinary: Negative for dysuria. Musculoskeletal: Negative for back pain. Skin: Negative for rash. Neurological: Negative for headaches, focal weakness or  numbness.  10-point ROS otherwise negative.  ____________________________________________   PHYSICAL EXAM:  VITAL SIGNS: ED Triage Vitals [04/27/20 1454]  Enc Vitals Group     BP (!) 178/78     Pulse Rate (!) 101     Resp 14     Temp 98 F (36.7 C)     Temp Source Oral     SpO2 100 %   Constitutional: Alert and oriented. Well appearing and in no acute distress. Eyes: Conjunctivae are normal.  Head: Atraumatic. Nose: No congestion/rhinnorhea. Mouth/Throat: Mucous membranes are moist.   Neck: No stridor. Cardiovascular: Normal rate, regular rhythm. Good peripheral circulation. Grossly normal heart sounds.   Respiratory: Normal respiratory effort.  No retractions. Lungs CTAB. Gastrointestinal: Soft and nontender. No distention.  Musculoskeletal: No lower extremity tenderness with 2+ pitting edema in the bilateral LEs.  Neurologic:  Normal speech and language.  Skin:  Skin is warm, dry and intact. No rash noted.  ____________________________________________   LABS (all labs ordered are listed, but only abnormal results are displayed)  Labs  Reviewed  COMPREHENSIVE METABOLIC PANEL - Abnormal; Notable for the following components:      Result Value   CO2 18 (*)    Glucose, Bld 311 (*)    Anion gap 18 (*)    All other components within normal limits  CBC - Abnormal; Notable for the following components:   RBC 4.10 (*)    Hemoglobin 11.2 (*)    HCT 35.8 (*)    All other components within normal limits  BETA-HYDROXYBUTYRIC ACID - Abnormal; Notable for the following components:   Beta-Hydroxybutyric Acid 0.44 (*)    All other components within normal limits  POC OCCULT BLOOD, ED - Abnormal; Notable for the following components:   Fecal Occult Bld POSITIVE (*)    All other components within normal limits  I-STAT VENOUS BLOOD GAS, ED - Abnormal; Notable for the following components:   pO2, Ven 56.0 (*)    Bicarbonate 29.0 (*)    Acid-Base Excess 4.0 (*)    Calcium, Ion  1.13 (*)    HCT 38.0 (*)    Hemoglobin 12.9 (*)    All other components within normal limits  SARS CORONAVIRUS 2 BY RT PCR (HOSPITAL ORDER, [place] [facility])  TYPE AND SCREEN   ____________________________________________  RADIOLOGY  CT ABDOMEN PELVIS W CONTRAST  Result Date: 04/27/2020 CLINICAL DATA:  59 year old male with abdominal pain. Concern for acute diverticulitis. EXAM: CT ABDOMEN AND PELVIS WITH CONTRAST TECHNIQUE: Multidetector CT imaging of the abdomen and pelvis was performed using the standard protocol following bolus administration of intravenous contrast. CONTRAST:  138mL OMNIPAQUE IOHEXOL 300 MG/ML  SOLN COMPARISON:  CT abdomen pelvis dated 07/31/2019. FINDINGS: Lower chest: The visualized lung bases are clear. No intra-abdominal free air or free fluid. Hepatobiliary: There is diffuse fatty infiltration of the liver. The liver is mildly enlarged. There is minimal apparent irregularity of the liver contour which may represent early changes of cirrhosis. Correlation with clinical exam and LFTs recommended. No intrahepatic biliary ductal dilatation. The gallbladder is unremarkable. Pancreas: Unremarkable. No pancreatic ductal dilatation or surrounding inflammatory changes. Spleen: Similar areas of hypoenhancement in the spleen as seen on the prior CT and CT dating back to 2017. This is not characterized on this CT but likely a benign or indolent process, possibly hemangiomas. This can be better evaluated with ultrasound on a nonemergent basis if clinically indicated. Adrenals/Urinary Tract: The adrenal glands are unremarkable. There is no hydronephrosis on either side. There is symmetric enhancement and excretion of contrast by both kidneys. The visualized ureters and urinary bladder appear unremarkable. Stomach/Bowel: Small scattered colonic diverticula without active inflammatory changes. There is no bowel obstruction or active inflammation. The appendix is normal.  Vascular/Lymphatic: Mild aortoiliac atherosclerotic disease. The IVC is unremarkable. No portal venous gas. There is no adenopathy. Reproductive: The prostate and seminal vesicles are grossly unremarkable. Other: None Musculoskeletal: Degenerative changes of the spine. No acute osseous pathology. IMPRESSION: 1. No bowel obstruction. Normal appendix. 2. Colonic diverticulosis. 3. Fatty liver with possible steatohepatitis and early changes of cirrhosis. Correlation with clinical exam and LFTs recommended. 4. Aortic Atherosclerosis (ICD10-I70.0). Electronically Signed   By: Anner Crete M.D.   On: 04/27/2020 21:25    ____________________________________________   PROCEDURES  Procedure(s) performed:   Procedures  None  ____________________________________________   INITIAL IMPRESSION / ASSESSMENT AND PLAN / ED COURSE  Pertinent labs & imaging results that were available during my care of the patient were reviewed by me and considered in my medical decision  making (see chart for details).   Patient presents emergency department for evaluation of blood in the emesis and stool.  He is arranged to see [facility] later this month but symptoms have worsened.  He has some mild tenderness across the middle of his abdomen.  Plan for CT abdomen pelvis given GI bleeding and pain to rule out diverticulitis.  Will start Protonix with some vomiting blood.  Patient is not anticoagulated and is hemodynamically stable.  Labs from triage show hemoglobin at 11.2.   10:25 PM  CT abdomen/pelvis reviewed. Hemoccult positive. Patient is very symptomatic. Discussed case with Dr. Alessandra Bevels. They will consult in the AM. Trend H/H overnight. Doubt DKA. VBG pH is normal.   Discussed patient's case with [facility] to request admission. Patient and family (if present) updated with plan. Care transferred to [facility] service.  I reviewed all nursing notes, vitals, pertinent old records, EKGs, labs, imaging (as  available).  ____________________________________________  FINAL CLINICAL IMPRESSION(S) / ED DIAGNOSES  Final diagnoses:  Gastrointestinal hemorrhage, unspecified gastrointestinal hemorrhage type    MEDICATIONS GIVEN DURING THIS VISIT:  Medications  ondansetron (ZOFRAN-ODT) disintegrating tablet 4 mg (4 mg Oral Given 04/27/20 1618)  pantoprazole (PROTONIX) 80 mg in sodium chloride 0.9 % 100 mL IVPB (0 mg Intravenous Stopped 04/27/20 2106)  iohexol (OMNIPAQUE) 300 MG/ML solution 100 mL (100 mLs Intravenous Contrast Given 04/27/20 2048)    Note:  This document was prepared using Dragon voice recognition software and may include unintentional dictation errors.  Nanda Quinton, MD, [facility] Emergency Medicine    Renea Schoonmaker, [facility], MD 04/27/20 [phone]

## 2020-04-27 NOTE — ED Triage Notes (Addendum)
Pt arrives to ED w/ c/o nosebleed, vomiting blood, and blood in stool. Pt states this has been going for two weeks. Pt endorses generalized abdominal pain. Pt states bright red AND dark tarry stools. Pt also c/o cracking, weeping BLE. Pt states 9/10 abdominal and BLE pain.

## 2020-04-28 ENCOUNTER — Encounter (HOSPITAL_COMMUNITY): Payer: Self-pay | Admitting: Internal Medicine

## 2020-04-28 DIAGNOSIS — I160 Hypertensive urgency: Secondary | ICD-10-CM | POA: Diagnosis not present

## 2020-04-28 DIAGNOSIS — K922 Gastrointestinal hemorrhage, unspecified: Secondary | ICD-10-CM | POA: Diagnosis present

## 2020-04-28 DIAGNOSIS — K921 Melena: Secondary | ICD-10-CM | POA: Diagnosis present

## 2020-04-28 DIAGNOSIS — K219 Gastro-esophageal reflux disease without esophagitis: Secondary | ICD-10-CM | POA: Diagnosis present

## 2020-04-28 DIAGNOSIS — E782 Mixed hyperlipidemia: Secondary | ICD-10-CM

## 2020-04-28 DIAGNOSIS — E1169 Type 2 diabetes mellitus with other specified complication: Secondary | ICD-10-CM

## 2020-04-28 DIAGNOSIS — Z91018 Allergy to other foods: Secondary | ICD-10-CM | POA: Diagnosis not present

## 2020-04-28 DIAGNOSIS — Z803 Family history of malignant neoplasm of breast: Secondary | ICD-10-CM | POA: Diagnosis not present

## 2020-04-28 DIAGNOSIS — D509 Iron deficiency anemia, unspecified: Secondary | ICD-10-CM | POA: Diagnosis present

## 2020-04-28 DIAGNOSIS — E785 Hyperlipidemia, unspecified: Secondary | ICD-10-CM | POA: Diagnosis present

## 2020-04-28 DIAGNOSIS — Z86718 Personal history of other venous thrombosis and embolism: Secondary | ICD-10-CM | POA: Diagnosis not present

## 2020-04-28 DIAGNOSIS — Z955 Presence of coronary angioplasty implant and graft: Secondary | ICD-10-CM | POA: Diagnosis not present

## 2020-04-28 DIAGNOSIS — I5032 Chronic diastolic (congestive) heart failure: Secondary | ICD-10-CM

## 2020-04-28 DIAGNOSIS — M1990 Unspecified osteoarthritis, unspecified site: Secondary | ICD-10-CM | POA: Diagnosis present

## 2020-04-28 DIAGNOSIS — Z886 Allergy status to analgesic agent status: Secondary | ICD-10-CM | POA: Diagnosis not present

## 2020-04-28 DIAGNOSIS — I2510 Atherosclerotic heart disease of native coronary artery without angina pectoris: Secondary | ICD-10-CM

## 2020-04-28 DIAGNOSIS — E1151 Type 2 diabetes mellitus with diabetic peripheral angiopathy without gangrene: Secondary | ICD-10-CM | POA: Diagnosis present

## 2020-04-28 DIAGNOSIS — Z8249 Family history of ischemic heart disease and other diseases of the circulatory system: Secondary | ICD-10-CM | POA: Diagnosis not present

## 2020-04-28 DIAGNOSIS — M5412 Radiculopathy, cervical region: Secondary | ICD-10-CM | POA: Diagnosis present

## 2020-04-28 DIAGNOSIS — Z86711 Personal history of pulmonary embolism: Secondary | ICD-10-CM | POA: Diagnosis not present

## 2020-04-28 DIAGNOSIS — E1165 Type 2 diabetes mellitus with hyperglycemia: Secondary | ICD-10-CM

## 2020-04-28 DIAGNOSIS — G4733 Obstructive sleep apnea (adult) (pediatric): Secondary | ICD-10-CM | POA: Diagnosis present

## 2020-04-28 DIAGNOSIS — Z20822 Contact with and (suspected) exposure to covid-19: Secondary | ICD-10-CM | POA: Diagnosis present

## 2020-04-28 DIAGNOSIS — K2901 Acute gastritis with bleeding: Secondary | ICD-10-CM | POA: Diagnosis present

## 2020-04-28 DIAGNOSIS — I110 Hypertensive heart disease with heart failure: Secondary | ICD-10-CM | POA: Diagnosis present

## 2020-04-28 DIAGNOSIS — Z833 Family history of diabetes mellitus: Secondary | ICD-10-CM | POA: Diagnosis not present

## 2020-04-28 DIAGNOSIS — Z7982 Long term (current) use of aspirin: Secondary | ICD-10-CM | POA: Diagnosis not present

## 2020-04-28 DIAGNOSIS — Z888 Allergy status to other drugs, medicaments and biological substances status: Secondary | ICD-10-CM | POA: Diagnosis not present

## 2020-04-28 DIAGNOSIS — Z6841 Body Mass Index (BMI) 40.0 and over, adult: Secondary | ICD-10-CM | POA: Diagnosis not present

## 2020-04-28 LAB — CBC WITH DIFFERENTIAL/PLATELET
Abs Immature Granulocytes: 0.02 10*3/uL (ref 0.00–0.07)
Basophils Absolute: 0 10*3/uL (ref 0.0–0.1)
Basophils Relative: 1 %
Eosinophils Absolute: 0.2 10*3/uL (ref 0.0–0.5)
Eosinophils Relative: 2 %
HCT: 36.2 % — ABNORMAL LOW (ref 39.0–52.0)
Hemoglobin: 11.2 g/dL — ABNORMAL LOW (ref 13.0–17.0)
Immature Granulocytes: 0 %
Lymphocytes Relative: 30 %
Lymphs Abs: 2.3 10*3/uL (ref 0.7–4.0)
MCH: 27.1 pg (ref 26.0–34.0)
MCHC: 30.9 g/dL (ref 30.0–36.0)
MCV: 87.4 fL (ref 80.0–100.0)
Monocytes Absolute: 0.6 10*3/uL (ref 0.1–1.0)
Monocytes Relative: 8 %
Neutro Abs: 4.4 10*3/uL (ref 1.7–7.7)
Neutrophils Relative %: 59 %
Platelets: 198 10*3/uL (ref 150–400)
RBC: 4.14 MIL/uL — ABNORMAL LOW (ref 4.22–5.81)
RDW: 15.2 % (ref 11.5–15.5)
WBC: 7.5 10*3/uL (ref 4.0–10.5)
nRBC: 0 % (ref 0.0–0.2)

## 2020-04-28 LAB — VITAMIN B12: Vitamin B-12: 305 pg/mL (ref 180–914)

## 2020-04-28 LAB — CBG MONITORING, ED
Glucose-Capillary: 193 mg/dL — ABNORMAL HIGH (ref 70–99)
Glucose-Capillary: 214 mg/dL — ABNORMAL HIGH (ref 70–99)
Glucose-Capillary: 228 mg/dL — ABNORMAL HIGH (ref 70–99)
Glucose-Capillary: 229 mg/dL — ABNORMAL HIGH (ref 70–99)
Glucose-Capillary: 231 mg/dL — ABNORMAL HIGH (ref 70–99)

## 2020-04-28 LAB — COMPREHENSIVE METABOLIC PANEL
ALT: 16 U/L (ref 0–44)
AST: 24 U/L (ref 15–41)
Albumin: 3.5 g/dL (ref 3.5–5.0)
Alkaline Phosphatase: 53 U/L (ref 38–126)
Anion gap: 13 (ref 5–15)
BUN: 12 mg/dL (ref 6–20)
CO2: 24 mmol/L (ref 22–32)
Calcium: 8.6 mg/dL — ABNORMAL LOW (ref 8.9–10.3)
Chloride: 100 mmol/L (ref 98–111)
Creatinine, Ser: 0.73 mg/dL (ref 0.61–1.24)
GFR calc Af Amer: >60 mL/min (ref >60)
GFR calc non Af Amer: >60 mL/min (ref >60)
Glucose, Bld: 242 mg/dL — ABNORMAL HIGH (ref 70–99)
Potassium: 3.6 mmol/L (ref 3.5–5.1)
Sodium: 137 mmol/L (ref 135–145)
Total Bilirubin: 0.7 mg/dL (ref 0.3–1.2)
Total Protein: 5.9 g/dL — ABNORMAL LOW (ref 6.5–8.1)

## 2020-04-28 LAB — PROTIME-INR
INR: 1 (ref 0.8–1.2)
Prothrombin Time: 12.8 seconds (ref 11.4–15.2)

## 2020-04-28 LAB — HEMOGLOBIN A1C
Hgb A1c MFr Bld: 12.4 % — ABNORMAL HIGH (ref 4.8–5.6)
Mean Plasma Glucose: 309.18 mg/dL

## 2020-04-28 LAB — MAGNESIUM: Magnesium: 1.9 mg/dL (ref 1.7–2.4)

## 2020-04-28 LAB — GLUCOSE, CAPILLARY
Glucose-Capillary: 217 mg/dL — ABNORMAL HIGH (ref 70–99)
Glucose-Capillary: 256 mg/dL — ABNORMAL HIGH (ref 70–99)

## 2020-04-28 LAB — IRON AND TIBC
Iron: 32 ug/dL — ABNORMAL LOW (ref 45–182)
Saturation Ratios: 8 % — ABNORMAL LOW (ref 17.9–39.5)
TIBC: 417 ug/dL (ref 250–450)
UIBC: 385 ug/dL

## 2020-04-28 LAB — PHOSPHORUS: Phosphorus: 3.7 mg/dL (ref 2.5–4.6)

## 2020-04-28 LAB — FOLATE: Folate: 10.3 ng/mL (ref >5.9)

## 2020-04-28 LAB — APTT: aPTT: 28 seconds (ref 24–36)

## 2020-04-28 LAB — SARS CORONAVIRUS 2 BY RT PCR (HOSPITAL ORDER, PERFORMED IN ~~LOC~~ HOSPITAL LAB): SARS Coronavirus 2: NEGATIVE

## 2020-04-28 MED ORDER — LISINOPRIL 5 MG PO TABS
5.0000 mg | ORAL_TABLET | Freq: Every day | ORAL | Status: DC
  Administered 2020-04-28 – 2020-04-30 (×2): 5 mg via ORAL
  Filled 2020-04-28 (×3): qty 1

## 2020-04-28 MED ORDER — FUROSEMIDE 40 MG PO TABS
40.0000 mg | ORAL_TABLET | Freq: Three times a day (TID) | ORAL | Status: DC
  Administered 2020-04-28 – 2020-04-30 (×4): 40 mg via ORAL
  Filled 2020-04-28: qty 1
  Filled 2020-04-28: qty 2
  Filled 2020-04-28 (×2): qty 1

## 2020-04-28 MED ORDER — HYDRALAZINE HCL 20 MG/ML IJ SOLN: 10.0000 mg | Freq: Four times a day (QID) | INTRAMUSCULAR | Status: DC | PRN

## 2020-04-28 MED ORDER — SODIUM CHLORIDE 0.9 % IV SOLN: INTRAVENOUS | Status: AC

## 2020-04-28 MED ORDER — ONDANSETRON HCL 4 MG PO TABS: 4.0000 mg | ORAL_TABLET | Freq: Four times a day (QID) | ORAL | Status: DC | PRN

## 2020-04-28 MED ORDER — ISOSORBIDE MONONITRATE ER 30 MG PO TB24
30.0000 mg | ORAL_TABLET | Freq: Every day | ORAL | Status: DC
  Administered 2020-04-28 – 2020-04-30 (×2): 30 mg via ORAL
  Filled 2020-04-28 (×3): qty 1

## 2020-04-28 MED ORDER — CARVEDILOL 25 MG PO TABS
25.0000 mg | ORAL_TABLET | Freq: Two times a day (BID) | ORAL | Status: DC
  Administered 2020-04-28 – 2020-04-30 (×4): 25 mg via ORAL
  Filled 2020-04-28 (×4): qty 1
  Filled 2020-04-28: qty 2

## 2020-04-28 MED ORDER — FENOFIBRATE 160 MG PO TABS
160.0000 mg | ORAL_TABLET | Freq: Every day | ORAL | Status: DC
  Administered 2020-04-28 – 2020-04-30 (×3): 160 mg via ORAL
  Filled 2020-04-28 (×3): qty 1

## 2020-04-28 MED ORDER — TIZANIDINE HCL 4 MG PO TABS
4.0000 mg | ORAL_TABLET | Freq: Three times a day (TID) | ORAL | Status: DC | PRN
  Filled 2020-04-28: qty 1

## 2020-04-28 MED ORDER — FERROUS SULFATE 325 (65 FE) MG PO TABS
325.0000 mg | ORAL_TABLET | Freq: Two times a day (BID) | ORAL | Status: DC
  Administered 2020-04-28 – 2020-04-30 (×4): 325 mg via ORAL
  Filled 2020-04-28 (×4): qty 1

## 2020-04-28 MED ORDER — PREGABALIN 75 MG PO CAPS
300.0000 mg | ORAL_CAPSULE | Freq: Two times a day (BID) | ORAL | Status: DC
  Administered 2020-04-28 – 2020-04-30 (×5): 300 mg via ORAL
  Filled 2020-04-28: qty 4
  Filled 2020-04-28: qty 6
  Filled 2020-04-28: qty 4
  Filled 2020-04-28: qty 3
  Filled 2020-04-28: qty 4

## 2020-04-28 MED ORDER — FUROSEMIDE 20 MG PO TABS: 40.0000 mg | ORAL_TABLET | Freq: Three times a day (TID) | ORAL | Status: DC

## 2020-04-28 MED ORDER — CYANOCOBALAMIN 1000 MCG/ML IJ SOLN
1000.0000 ug | Freq: Once | INTRAMUSCULAR | Status: AC
  Administered 2020-04-28: 1000 ug via SUBCUTANEOUS
  Filled 2020-04-28 (×2): qty 1

## 2020-04-28 MED ORDER — ALLOPURINOL 100 MG PO TABS
100.0000 mg | ORAL_TABLET | Freq: Every day | ORAL | Status: DC
  Administered 2020-04-28 – 2020-04-30 (×3): 100 mg via ORAL
  Filled 2020-04-28 (×4): qty 1

## 2020-04-28 MED ORDER — FOLIC ACID 1 MG PO TABS
1.0000 mg | ORAL_TABLET | Freq: Every day | ORAL | Status: DC
  Administered 2020-04-28 – 2020-04-30 (×3): 1 mg via ORAL
  Filled 2020-04-28 (×3): qty 1

## 2020-04-28 MED ORDER — OXYCODONE HCL 5 MG PO TABS
15.0000 mg | ORAL_TABLET | Freq: Three times a day (TID) | ORAL | Status: DC | PRN
  Administered 2020-04-28 – 2020-04-29 (×4): 15 mg via ORAL
  Filled 2020-04-28 (×3): qty 3

## 2020-04-28 MED ORDER — SODIUM CHLORIDE 0.9 % IV SOLN: INTRAVENOUS | Status: DC

## 2020-04-28 MED ORDER — ONDANSETRON HCL 4 MG/2ML IJ SOLN
4.0000 mg | Freq: Four times a day (QID) | INTRAMUSCULAR | Status: DC | PRN
  Administered 2020-04-29: 4 mg via INTRAVENOUS
  Filled 2020-04-28: qty 2

## 2020-04-28 MED ORDER — POLYETHYLENE GLYCOL 3350 17 G PO PACK: 17.0000 g | PACK | Freq: Every day | ORAL | Status: DC | PRN

## 2020-04-28 MED ORDER — ALBUTEROL SULFATE (2.5 MG/3ML) 0.083% IN NEBU: 2.5000 mg | INHALATION_SOLUTION | Freq: Four times a day (QID) | RESPIRATORY_TRACT | Status: DC | PRN

## 2020-04-28 MED ORDER — ACETAMINOPHEN 325 MG PO TABS
650.0000 mg | ORAL_TABLET | Freq: Four times a day (QID) | ORAL | Status: DC | PRN
  Filled 2020-04-28: qty 2

## 2020-04-28 MED ORDER — ATORVASTATIN CALCIUM 40 MG PO TABS
40.0000 mg | ORAL_TABLET | Freq: Every day | ORAL | Status: DC
  Administered 2020-04-28 – 2020-04-30 (×3): 40 mg via ORAL
  Filled 2020-04-28 (×3): qty 1

## 2020-04-28 MED ORDER — DULOXETINE HCL 60 MG PO CPEP
60.0000 mg | ORAL_CAPSULE | Freq: Every day | ORAL | Status: DC
  Administered 2020-04-28 – 2020-04-30 (×3): 60 mg via ORAL
  Filled 2020-04-28 (×4): qty 1

## 2020-04-28 MED ORDER — ACETAMINOPHEN 650 MG RE SUPP: 650.0000 mg | Freq: Four times a day (QID) | RECTAL | Status: DC | PRN

## 2020-04-28 MED ORDER — TRAZODONE HCL 50 MG PO TABS
50.0000 mg | ORAL_TABLET | Freq: Every day | ORAL | Status: DC
  Administered 2020-04-28 – 2020-04-29 (×2): 50 mg via ORAL
  Filled 2020-04-28 (×2): qty 1

## 2020-04-28 MED ORDER — NITROGLYCERIN 0.4 MG SL SUBL: 0.4000 mg | SUBLINGUAL_TABLET | SUBLINGUAL | Status: DC | PRN

## 2020-04-28 MED ORDER — SERTRALINE HCL 50 MG PO TABS
50.0000 mg | ORAL_TABLET | Freq: Every day | ORAL | Status: DC
  Administered 2020-04-28 – 2020-04-30 (×3): 50 mg via ORAL
  Filled 2020-04-28 (×4): qty 1

## 2020-04-28 MED ORDER — PEG 3350-KCL-NA BICARB-NACL 420 G PO SOLR
4000.0000 mL | Freq: Once | ORAL | Status: AC
  Administered 2020-04-28: 4000 mL via ORAL
  Filled 2020-04-28 (×2): qty 4000

## 2020-04-28 MED ORDER — OXYCODONE HCL ER 10 MG PO T12A
40.0000 mg | EXTENDED_RELEASE_TABLET | Freq: Two times a day (BID) | ORAL | Status: DC
  Administered 2020-04-28 – 2020-04-30 (×6): 40 mg via ORAL
  Filled 2020-04-28 (×5): qty 4

## 2020-04-28 MED ORDER — PEG 3350-KCL-NA BICARB-NACL 420 G PO SOLR: 4000.0000 mL | Freq: Once | ORAL | Status: DC

## 2020-04-28 NOTE — Consult Note (Addendum)
Referring Provider: Dr. Cyd Silence Primary Care Physician:  [name], FNP Primary Gastroenterologist:  [facility]  Reason for Consultation:  Hematemesis, rectal bleeding  HPI: [name] is a 59 y.o. male with an extensive past medical history noted below to include CHF, CAD (s/p drug eluting stent 05/2016), type 2 DM (on insulin), CHF (EF 60-65% on 03/2019), DVT/PE (not currently on anticoagulation), morbid obesity, and IBS-D presenting with hematemesis and rectal bleeding.  Patient reports that over the last 15 days, he has had intermittent epigastric pain, nausea, and hematemesis.  He states the emesis consists of both black and red blood.  He had 2 episodes of emesis yesterday and also had one nosebleed.  Food seems to exacerbate his symptoms.  He has also noted some recent rectal bleeding, stating that his bowel movements have both black and red blood, though he did have small amounts of bright red blood per rectum preceding his most recent episodes of hematemesis. He has intermittent nausea and vomiting for which he takes Zofran.    Patient also notes dysphagia to solids, feeling as if food gets stuck in his lower esophagus/chest.  Reports history of GERD, which is currently well-controlled with 40mg  Protonix once daily.  He has also noted some decreased appetite but denies any unexplained weight loss.  He also reports some recent fatigue, shortness of breath, and chest pain.  Patient reports chronic intermittent diarrhea, which she attributes to IBS-D.  On bad days or when he is stressed, he has 3-4 loose bowel movements per day.  He denies any constipation.  He takes 81 mg aspirin daily but denies any NSAID or blood thinner use.  Denies alcohol use.  Denies any family history of colon cancer or gastrointestinal malignancies.  Denies any family history of bleeding disorders.  Per chart review, patient had similar symptoms in 2017 and had an EGD at an outside hospital in 2017 which  showed mild gastritis.  He also had a colonoscopy in 2017 which revealed internal hemorrhoids and one adenomatous polyp.  Past Medical History:  Diagnosis Date  . Anemia   . Arthritis   . Back pain   . CAD (coronary artery disease)    a. s/p DES to LAD in 05/2016  . Cervical radiculopathy   . Chronic diastolic CHF (congestive heart failure) ([place])   . Chronic pain   . Depression   . DVT (deep venous thrombosis) ([place])   . Hematemesis   . Hepatic steatosis   . Hyperlipidemia   . Hypertension   . IBS (irritable bowel syndrome)   . Morbid obesity ([place])   . OSA (obstructive sleep apnea)   . Pancreatitis   . PE (pulmonary thromboembolism) ([place])   . Peripheral neuropathy   . PUD (peptic ulcer disease)   . Renal disorder   . Stroke [facility])    a. ?details unclear - not seen on imaging when he was admitted in 05/2017 for TIA symptoms which were felt due to cervical radiculopathy.  . Thoracic aortic ectasia ([facility])    a. 4.3cm ectatic ascending thoracic aorta by CT 06/2017.   . Type 2 diabetes mellitus ([place])     Past Surgical History:  Procedure Laterality Date  . CARDIAC CATHETERIZATION N/A 05/31/2016   Procedure: Left Heart Cath and Coronary Angiography;  Surgeon: Peter M Martinique, MD;  Location: [place] [facility];  Service: Cardiovascular;  Laterality: N/A;  . CARDIAC CATHETERIZATION N/A 05/31/2016   Procedure: Intravascular Pressure Wire/FFR Study;  Surgeon: Ander Slade  Martinique, MD;  Location: [place] [facility];  Service: Cardiovascular;  Laterality: N/A;  . CARDIAC CATHETERIZATION N/A 05/31/2016   Procedure: Coronary Stent Intervention;  Surgeon: Peter M Martinique, MD;  Location: [place] [facility];  Service: Cardiovascular;  Laterality: N/A;  . LEFT HEART CATH AND CORONARY ANGIOGRAPHY N/A 12/08/2017   Procedure: LEFT HEART CATH AND CORONARY ANGIOGRAPHY;  Surgeon: Leonie Man, MD;  Location: [place] [facility];  Service: Cardiovascular;  Laterality: N/A;  . LEFT HEART CATHETERIZATION WITH  CORONARY ANGIOGRAM N/A 02/03/2014   Procedure: LEFT HEART CATHETERIZATION WITH CORONARY ANGIOGRAM;  Surgeon: Pixie Casino, MD;  Location: [facility] CATH LAB;  Service: Cardiovascular;  Laterality: N/A;  . left leg stent       Prior to Admission medications   Medication Sig Start Date End Date Taking? Authorizing Provider  Accu-Chek FastClix Lancets MISC 1 Device by Other route 2 (two) times daily.  03/20/19  Yes [provider]  Albuterol Sulfate (PROAIR RESPICLICK) 161 (90 Base) MCG/ACT AEPB Inhale 2 puffs into the lungs every 6 (six) hours as needed. Patient taking differently: Inhale 2 puffs into the lungs every 6 (six) hours as needed (for breathing).  11/06/18  Yes Robyn [facility]er, MD  allopurinol (ZYLOPRIM) 100 MG tablet Take 100 mg by mouth daily. 03/31/20  Yes [provider]  aspirin 81 MG chewable tablet Chew 1 tablet (81 mg total) by mouth daily. 06/01/16  Yes Elwin Mocha, MD  atorvastatin (LIPITOR) 40 MG tablet Take 1 tablet (40 mg total) by mouth daily. 02/12/20 05/12/20 Yes Hilty, Nadean Corwin, MD  carvedilol (COREG) 25 MG tablet Take 1 tablet (25 mg total) by mouth 2 (two) times daily with a meal. 01/26/17  Yes Strader, [place] M, PA-C  [facility] VITAMIN B12 1000 MCG tablet Take 1,000 mcg by mouth daily. 03/06/20  Yes [provider]  diclofenac (FLECTOR) 1.3 % PTCH Place 1 patch onto the skin daily as needed for pain. 03/02/20  Yes [provider]  DULoxetine (CYMBALTA) 60 MG capsule Take 1 capsule (60 mg total) by mouth daily. Please request future refills from PCP. 11/28/19  Yes Marcial Pacas, MD  famotidine (PEPCID) 20 MG tablet Take 20 mg by mouth daily. 03/05/20  Yes [provider]  fenofibrate (TRICOR) 145 MG tablet Take 1 tablet (145 mg total) by mouth daily. 03/03/20  Yes Hilty, Nadean Corwin, MD  furosemide (LASIX) 40 MG tablet Take 1 tablet (40 mg total) by mouth 3 (three) times daily. 08/23/19 08/22/20 Yes Pahwani, Einar Grad, MD  glipiZIDE (GLUCOTROL) 10 MG  tablet Take 10 mg by mouth 2 (two) times daily. 05/30/17  Yes [provider]  Insulin Degludec 200 UNIT/ML SOPN Inject 140 Units as directed at bedtime.  10/16/19  Yes [provider]  Insulin Pen Needle 31G X 5 MM MISC Use 1 needle daily to inject insulin as prescribed Patient taking differently: 1 each by Other route See admin instructions. Use 1 needle daily to inject insulin as prescribed 06/18/17  Yes Barton Dubois, MD  isosorbide mononitrate (IMDUR) 30 MG 24 hr tablet Take 1 tablet (30 mg total) by mouth daily. 10/16/19  Yes Lendon Colonel, NP  lisinopril (ZESTRIL) 5 MG tablet Take 5 mg by mouth daily. 03/05/20  Yes [provider]  nitroGLYCERIN (NITROSTAT) 0.4 MG SL tablet Place 1 tablet (0.4 mg total) under the tongue every 5 (five) minutes as needed for chest pain. 02/06/19  Yes Skeet Latch, MD  NOVOLOG FLEXPEN 100 UNIT/ML FlexPen  Inject 36-40 Units into the skin 3 (three) times daily with meals. Per sliding scale 05/13/19  Yes [provider]  ondansetron (ZOFRAN) 4 MG tablet Take 4 mg by mouth 2 (two) times daily as needed for nausea/vomiting. 04/21/20  Yes [provider]  oxyCODONE (ROXICODONE) 15 MG immediate release tablet Take 15 mg 3 (three) times daily as needed by mouth for pain.  08/28/17  Yes [provider]  oxyCODONE ER (XTAMPZA ER) 36 MG C12A Take 36 mg by mouth 2 (two) times daily.   Yes [provider]  pantoprazole (PROTONIX) 40 MG tablet Take 40 mg by mouth daily. 08/12/19  Yes [provider]  potassium chloride (MICRO-K) 10 MEQ CR capsule Take 10 mEq by mouth in the morning and at bedtime. 04/03/20  Yes [provider]  pregabalin (LYRICA) 300 MG capsule Take 300 mg by mouth 2 (two) times daily. 05/18/19  Yes [provider]  sertraline (ZOLOFT) 50 MG tablet Take 50 mg by mouth daily.   Yes [provider]  tiZANidine (ZANAFLEX) 4 MG tablet Take 4 mg by mouth 3 (three)  times daily as needed for muscle spasms. 03/31/20  Yes [provider]  traZODone (DESYREL) 50 MG tablet Take 1 tablet (50 mg total) by mouth at bedtime. 12/15/16  Yes Burns, Arloa Koh, MD  Vitamin D, Ergocalciferol, (DRISDOL) 1.25 MG (50000 UT) CAPS capsule Take 50,000 Units by mouth once a week. mondays 08/15/19  Yes [provider]  XTAMPZA ER 27 MG C12A Take 27 mg by mouth 2 (two) times daily. Patient not taking: Reported on 04/27/2020 08/15/19   [provider]    Scheduled Meds: . allopurinol  100 mg Oral Daily  . atorvastatin  40 mg Oral Daily  . carvedilol  25 mg Oral BID WC  . cyanocobalamin  1,000 mcg Subcutaneous Once  . DULoxetine  60 mg Oral Daily  . fenofibrate  160 mg Oral Daily  . ferrous sulfate  325 mg Oral BID WC  . folic acid  1 mg Oral Daily  . furosemide  40 mg Oral TID WC  . insulin aspart  0-20 Units Subcutaneous Q4H  . insulin glargine  140 Units Subcutaneous Q2200  . isosorbide mononitrate  30 mg Oral Daily  . lisinopril  5 mg Oral Daily  . oxyCODONE  40 mg Oral BID  . pantoprazole (PROTONIX) IV  40 mg Intravenous Q12H  . pregabalin  300 mg Oral BID  . sertraline  50 mg Oral Daily  . traZODone  50 mg Oral QHS   Continuous Infusions: . sodium chloride 100 mL/hr at 04/28/20 0031   PRN Meds:.acetaminophen **OR** acetaminophen, albuterol, hydrALAZINE, nitroGLYCERIN, ondansetron **OR** ondansetron (ZOFRAN) IV, oxyCODONE, polyethylene glycol, tiZANidine  Allergies as of 04/27/2020 - Review Complete 04/27/2020  Allergen Reaction Noted  . Coconut flavor [flavoring agent] Hives 01/26/2017  . Coconut oil Hives 03/09/2016  . Ibuprofen Other (See Comments) 01/29/2014  . Aleve [naproxen] Other (See Comments) 09/02/2017  . Nsaids Other (See Comments) 12/27/2017  . Vascepa [icosapent ethyl] Other (See Comments) 09/30/2019    Family History  Problem Relation Age of Onset  . Cancer Father   . Hypertension Mother   . Diabetes Mother   .  Breast cancer Mother   . Hypertension Brother   . Diabetes Brother   . Hypertension Sister   . Diabetes Sister     Social History   Socioeconomic History  . Marital status: Single    Spouse name:  Not on file  . Number of children: 3  . Years of education: 68  . Highest education level: Not on file  Occupational History  . Occupation: Disabled  Tobacco Use  . Smoking status: Never Smoker  . Smokeless tobacco: Never Used  Vaping Use  . Vaping Use: Never used  Substance and Sexual Activity  . Alcohol use: No  . Drug use: No  . Sexual activity: Not on file  Other Topics Concern  . Not on file  Social History Narrative   Independent and ambulatory with cane.   Lives at home alone.   Right-handed.   1-2 cups caffeine per day.   Social Determinants of Health   Financial Resource Strain:   . Difficulty of Paying Living Expenses:   Food Insecurity:   . Worried About Charity fundraiser in the Last Year:   . Arboriculturist in the Last Year:   Transportation Needs:   . Film/video editor (Medical):   Marland Kitchen Lack of Transportation (Non-Medical):   Physical Activity:   . Days of Exercise per Week:   . Minutes of Exercise per Session:   Stress:   . Feeling of Stress :   Social Connections:   . Frequency of Communication with Friends and Family:   . Frequency of Social Gatherings with Friends and Family:   . Attends Religious Services:   . Active Member of Clubs or Organizations:   . Attends Archivist Meetings:   Marland Kitchen Marital Status:   Intimate Partner Violence:   . Fear of Current or Ex-Partner:   . Emotionally Abused:   Marland Kitchen Physically Abused:   . Sexually Abused:     Review of Systems:Review of Systems  Constitutional: Positive for malaise/fatigue. Negative for chills, fever and weight loss.  HENT: Negative for hearing loss and tinnitus.   Eyes: Negative for pain and redness.  Respiratory: Positive for shortness of breath. Negative for cough.    Cardiovascular: Positive for chest pain. Negative for palpitations.  Gastrointestinal: Positive for abdominal pain, blood in stool, diarrhea, melena, nausea and vomiting. Negative for constipation and heartburn.  Genitourinary: Negative for flank pain and hematuria.  Musculoskeletal: Negative for falls and joint pain.  Skin: Negative for itching and rash.  Neurological: Negative for seizures and loss of consciousness.  Endo/Heme/Allergies: Negative for polydipsia. Does not bruise/bleed easily.  Psychiatric/Behavioral: Negative for substance abuse. The patient is not nervous/anxious.      Physical Exam: Vital signs: Vitals:   04/28/20 0500 04/28/20 0749  BP: 140/70 (!) 143/85  Pulse: 71 66  Resp: 11 14  Temp:    SpO2: 96% 99%     Physical Exam Constitutional:      General: He is not in acute distress.    Appearance: He is obese.  HENT:     Head: Normocephalic and atraumatic.     Nose: Nose normal.     Comments: Nasal cannula in place    Mouth/Throat:     Mouth: Mucous membranes are moist.     Pharynx: Oropharynx is clear.  Eyes:     General: No scleral icterus.    Extraocular Movements: Extraocular movements intact.     Conjunctiva/sclera: Conjunctivae normal.  Cardiovascular:     Rate and Rhythm: Normal rate and regular rhythm.     Pulses: Normal pulses.     Heart sounds: Normal heart sounds.  Pulmonary:     Effort: Pulmonary effort is normal. No respiratory distress.  Abdominal:  General: Bowel sounds are normal. There is distension.     Palpations: There is no mass.     Tenderness: There is abdominal tenderness (mild: epigastric, RUQ, LUQ). There is no guarding or rebound.     Hernia: No hernia is present.     Comments: Somewhat firm but without peritoneal signs  Musculoskeletal:        General: No swelling or tenderness.     Cervical back: Normal range of motion and neck supple.     Comments: Chronic venous insufficiency  Skin:    General: Skin is warm  and dry.  Neurological:     General: No focal deficit present.     Mental Status: He is oriented to person, place, and time. He is lethargic.  Psychiatric:        Mood and Affect: Mood normal.        Behavior: Behavior normal.    GI:  Lab Results: Recent Labs    04/27/20 1509 04/27/20 2031 04/28/20 0240  WBC 8.3  --  7.5  HGB 11.2* 12.9* 11.2*  HCT 35.8* 38.0* 36.2*  PLT 217  --  198   BMET Recent Labs    04/27/20 1509 04/27/20 2031 04/28/20 0240  NA 135 139 137  K 3.9 3.7 3.6  CL 99  --  100  CO2 18*  --  24  GLUCOSE 311*  --  242*  BUN 12  --  12  CREATININE 0.90  --  0.73  CALCIUM 9.1  --  8.6*   LFT Recent Labs    04/28/20 0240  PROT 5.9*  ALBUMIN 3.5  AST 24  ALT 16  ALKPHOS 53  BILITOT 0.7   PT/INR Recent Labs    04/28/20 0240  LABPROT 12.8  INR 1.0     Studies/Results: CT ABDOMEN PELVIS W CONTRAST  Result Date: 04/27/2020 CLINICAL DATA:  59 year old male with abdominal pain. Concern for acute diverticulitis. EXAM: CT ABDOMEN AND PELVIS WITH CONTRAST TECHNIQUE: Multidetector CT imaging of the abdomen and pelvis was performed using the standard protocol following bolus administration of intravenous contrast. CONTRAST:  145mL OMNIPAQUE IOHEXOL 300 MG/ML  SOLN COMPARISON:  CT abdomen pelvis dated 07/31/2019. FINDINGS: Lower chest: The visualized lung bases are clear. No intra-abdominal free air or free fluid. Hepatobiliary: There is diffuse fatty infiltration of the liver. The liver is mildly enlarged. There is minimal apparent irregularity of the liver contour which may represent early changes of cirrhosis. Correlation with clinical exam and LFTs recommended. No intrahepatic biliary ductal dilatation. The gallbladder is unremarkable. Pancreas: Unremarkable. No pancreatic ductal dilatation or surrounding inflammatory changes. Spleen: Similar areas of hypoenhancement in the spleen as seen on the prior CT and CT dating back to 2017. This is not characterized  on this CT but likely a benign or indolent process, possibly hemangiomas. This can be better evaluated with ultrasound on a nonemergent basis if clinically indicated. Adrenals/Urinary Tract: The adrenal glands are unremarkable. There is no hydronephrosis on either side. There is symmetric enhancement and excretion of contrast by both kidneys. The visualized ureters and urinary bladder appear unremarkable. Stomach/Bowel: Small scattered colonic diverticula without active inflammatory changes. There is no bowel obstruction or active inflammation. The appendix is normal. Vascular/Lymphatic: Mild aortoiliac atherosclerotic disease. The IVC is unremarkable. No portal venous gas. There is no adenopathy. Reproductive: The prostate and seminal vesicles are grossly unremarkable. Other: None Musculoskeletal: Degenerative changes of the spine. No acute osseous pathology. IMPRESSION: 1. No bowel obstruction. Normal appendix. 2.  Colonic diverticulosis. 3. Fatty liver with possible steatohepatitis and early changes of cirrhosis. Correlation with clinical exam and LFTs recommended. 4. Aortic Atherosclerosis (ICD10-I70.0). Electronically Signed   By: Anner Crete M.D.   On: 04/27/2020 21:25    Impression: Suspected upper GI bleeding: hematemesis and melena.  Gastritis vs. esophagitis vs. PUD vs. MWT -Hgb 11.2, stable as compared to baseline -Iron deficiency anemia (Iron 32, saturation 8%) -BUN 12/Cr 0.73  Rectal bleeding: sounds hemorrhoidal, though patient has history of adenomatous polyp (2017) and is due for repeat colonoscopy.  Also has IDA as noted above.  Fatty liver per CT 04/27/20 -LFTs WNL: T. Bili 0.7/ AST 24/ ALT16/ ALP 53  CHF: EF 60-65% as of 03/2019  Plan: EGD and colonoscopy tomorrow with Dr. Michail Sermon.  I thoroughly reviewed the procedures with the patient including nature, alternatives, benefits, and risks (including but not limited to bleeding, infection, perforation, anesthesia  complications/cardiac and pulmonary complications).  Patient verbalized understanding and gave verbal consent to proceed with EGD and colonoscopy tomorrow.  Continue antiemetics as needed, especially during prep.  Continue Protonix 40 mg IV twice daily.  Continue to monitor H&H with transfusion as needed to maintain hemoglobin greater than 8.  [facility] will follow.   LOS: 0 days   Salley Slaughter  PA-C 04/28/2020, 8:23 AM  Contact #  [phone]

## 2020-04-28 NOTE — H&P (Signed)
History and Physical    [name] TWS:[id number] DOB: September 19, 1961 DOA: 04/27/2020  PCP: Juan Halsted, FNP  Patient coming from: Home   Chief Complaint:  Chief Complaint  Patient presents with  . GI Bleeding     HPI:    59 year old male with past medical history of coronary artery disease (S/P DES to LAD 05/2016), diabetes mellitus type 2 (insulin dependent, last Hgb A1C 11.6% 00/1749), diastolic congestive heart failure (03/2019 - EF 60-65% with impaired relaxation), depression, hypertension, obesity who presents to [facility] emergency department with complaints of bright red blood per rectum as well as hematemesis.  Patient explains that approximately 2 weeks ago he was at a restaurant eating ribs and he suddenly felt some epigastric pain followed by intense nausea.  Patient vomited twice that evening shortly thereafter.  Patient states that his vomitus had the appearance of blood but he was not sure if it was barbecue sauce or actual blood.  In the days that followed, patient states that he was intermittently experiencing bright red blood both on his toilet paper as well as in the toilet bowl.  Additionally, patient has been experiencing ongoing epigastric pain, 7 out of 10 in intensity, dull in quality, waxing and waning, worse with movement and improved with rest.  Pain radiates from the epigastric region in a bandlike distribution around the abdomen.  Patient's intermittent bleeding and abdominal pain persisted over the next several days.  Patient complains of associated generalized weakness and decreased appetite over the span of time.  On 7/5, the patient reports 2 episodes of vomiting with blood mixed with previously eaten food.  Patient also experienced an episode of epistaxis at approximately the same time.  This is what prompted the patient to present to [facility] emergency department for evaluation.  Upon evaluation in the emergency department, stool  Hemoccult was performed and was found to be positive.  Patient was provided with 80 mg of intravenous Protonix.  Case was discussed with Dr.Brahmbhatt with gastroenterology who stated that they will see the patient in the morning for possible endoscopic evaluation.  The hospitalist group was then called to assess patient for admission the hospital.  Review of Systems: A 10-system review of systems has been performed and all systems are negative with the exception of what is listed in the HPI.    Past Medical History:  Diagnosis Date  . Anemia   . Arthritis   . Back pain   . CAD (coronary artery disease)    a. s/p DES to LAD in 05/2016  . Cervical radiculopathy   . Chronic diastolic CHF (congestive heart failure) ([place])   . Chronic pain   . Depression   . DVT (deep venous thrombosis) ([place])   . Hematemesis   . Hepatic steatosis   . Hyperlipidemia   . Hypertension   . IBS (irritable bowel syndrome)   . Morbid obesity ([place])   . OSA (obstructive sleep apnea)   . Pancreatitis   . PE (pulmonary thromboembolism) ([place])   . Peripheral neuropathy   . PUD (peptic ulcer disease)   . Renal disorder   . Stroke [facility])    a. ?details unclear - not seen on imaging when he was admitted in 05/2017 for TIA symptoms which were felt due to cervical radiculopathy.  . Thoracic aortic ectasia (HCC)    a. 4.3cm ectatic ascending thoracic aorta by CT 06/2017.   . Type 2 diabetes mellitus (HCC)     Past  Surgical History:  Procedure Laterality Date  . CARDIAC CATHETERIZATION N/A 05/31/2016   Procedure: Left Heart Cath and Coronary Angiography;  Surgeon: Peter M Martinique, MD;  Location: [place] CV LAB;  Service: Cardiovascular;  Laterality: N/A;  . CARDIAC CATHETERIZATION N/A 05/31/2016   Procedure: Intravascular Pressure Wire/FFR Study;  Surgeon: Peter M Martinique, MD;  Location: [place] CV LAB;  Service: Cardiovascular;  Laterality: N/A;  . CARDIAC CATHETERIZATION N/A 05/31/2016   Procedure: Coronary Stent  Intervention;  Surgeon: Peter M Martinique, MD;  Location: [place] CV LAB;  Service: Cardiovascular;  Laterality: N/A;  . LEFT HEART CATH AND CORONARY ANGIOGRAPHY N/A 12/08/2017   Procedure: LEFT HEART CATH AND CORONARY ANGIOGRAPHY;  Surgeon: Leonie Man, MD;  Location: [place] CV LAB;  Service: Cardiovascular;  Laterality: N/A;  . LEFT HEART CATHETERIZATION WITH CORONARY ANGIOGRAM N/A 02/03/2014   Procedure: LEFT HEART CATHETERIZATION WITH CORONARY ANGIOGRAM;  Surgeon: Pixie Casino, MD;  Location: [facility] CATH LAB;  Service: Cardiovascular;  Laterality: N/A;  . left leg stent        reports that he has never smoked. He has never used smokeless tobacco. He reports that he does not drink alcohol and does not use drugs.  Allergies  Allergen Reactions  . Coconut Flavor [Flavoring Agent] Hives  . Coconut Oil Hives  . Ibuprofen Other (See Comments)    Made gastric ulcers worse  . Aleve [Naproxen] Other (See Comments)    DUE TO KIDNEYS  . Nsaids Other (See Comments)    Stomach ulcers   . Vascepa [Icosapent Ethyl] Other (See Comments)    headaches, chest pain, similar to sx of a stroke, hypotension     Family History  Problem Relation Age of Onset  . Cancer Father   . Hypertension Mother   . Diabetes Mother   . Breast cancer Mother   . Hypertension Brother   . Diabetes Brother   . Hypertension Sister   . Diabetes Sister      Prior to Admission medications   Medication Sig Start Date End Date Taking? Authorizing Provider  Accu-Chek FastClix Lancets MISC 1 Device by Other route 2 (two) times daily.  03/20/19  Yes [provider]  Albuterol Sulfate (PROAIR RESPICLICK) 355 (90 Base) MCG/ACT AEPB Inhale 2 puffs into the lungs every 6 (six) hours as needed. Patient taking differently: Inhale 2 puffs into the lungs every 6 (six) hours as needed (for breathing).  11/06/18  Yes Robyn Haber, MD  allopurinol (ZYLOPRIM) 100 MG tablet Take 100 mg by mouth daily. 03/31/20  Yes  [provider]  aspirin 81 MG chewable tablet Chew 1 tablet (81 mg total) by mouth daily. 06/01/16  Yes Elwin Mocha, MD  atorvastatin (LIPITOR) 40 MG tablet Take 1 tablet (40 mg total) by mouth daily. 02/12/20 05/12/20 Yes Hilty, Nadean Corwin, MD  carvedilol (COREG) 25 MG tablet Take 1 tablet (25 mg total) by mouth 2 (two) times daily with a meal. 01/26/17  Yes Strader, [place] M, PA-C  [employer] VITAMIN B12 1000 MCG tablet Take 1,000 mcg by mouth daily. 03/06/20  Yes [provider]  diclofenac (FLECTOR) 1.3 % PTCH Place 1 patch onto the skin daily as needed for pain. 03/02/20  Yes [provider]  DULoxetine (CYMBALTA) 60 MG capsule Take 1 capsule (60 mg total) by mouth daily. Please request future refills from PCP. 11/28/19  Yes Marcial Pacas, MD  famotidine (PEPCID) 20 MG tablet Take 20 mg by mouth daily. 03/05/20  Yes [provider]  fenofibrate (TRICOR) 145 MG tablet Take 1 tablet (145 mg total) by mouth daily. 03/03/20  Yes Hilty, Nadean Corwin, MD  furosemide (LASIX) 40 MG tablet Take 1 tablet (40 mg total) by mouth 3 (three) times daily. 08/23/19 08/22/20 Yes Pahwani, Einar Grad, MD  glipiZIDE (GLUCOTROL) 10 MG tablet Take 10 mg by mouth 2 (two) times daily. 05/30/17  Yes [provider]  Insulin Degludec 200 UNIT/ML SOPN Inject 140 Units as directed at bedtime.  10/16/19  Yes [provider]  Insulin Pen Needle 31G X 5 MM MISC Use 1 needle daily to inject insulin as prescribed Patient taking differently: 1 each by Other route See admin instructions. Use 1 needle daily to inject insulin as prescribed 06/18/17  Yes Barton Dubois, MD  isosorbide mononitrate (IMDUR) 30 MG 24 hr tablet Take 1 tablet (30 mg total) by mouth daily. 10/16/19  Yes Lendon Colonel, NP  lisinopril (ZESTRIL) 5 MG tablet Take 5 mg by mouth daily. 03/05/20  Yes [provider]  nitroGLYCERIN (NITROSTAT) 0.4 MG SL tablet Place 1 tablet (0.4 mg total) under the tongue every 5 (five)  minutes as needed for chest pain. 02/06/19  Yes Skeet Latch, MD  NOVOLOG FLEXPEN 100 UNIT/ML FlexPen Inject 36-40 Units into the skin 3 (three) times daily with meals. Per sliding scale 05/13/19  Yes [provider]  ondansetron (ZOFRAN) 4 MG tablet Take 4 mg by mouth 2 (two) times daily as needed for nausea/vomiting. 04/21/20  Yes [provider]  oxyCODONE (ROXICODONE) 15 MG immediate release tablet Take 15 mg 3 (three) times daily as needed by mouth for pain.  08/28/17  Yes [provider]  oxyCODONE ER (XTAMPZA ER) 36 MG C12A Take 36 mg by mouth 2 (two) times daily.   Yes [provider]  pantoprazole (PROTONIX) 40 MG tablet Take 40 mg by mouth daily. 08/12/19  Yes [provider]  potassium chloride (MICRO-K) 10 MEQ CR capsule Take 10 mEq by mouth in the morning and at bedtime. 04/03/20  Yes [provider]  pregabalin (LYRICA) 300 MG capsule Take 300 mg by mouth 2 (two) times daily. 05/18/19  Yes [provider]  sertraline (ZOLOFT) 50 MG tablet Take 50 mg by mouth daily.   Yes [provider]  tiZANidine (ZANAFLEX) 4 MG tablet Take 4 mg by mouth 3 (three) times daily as needed for muscle spasms. 03/31/20  Yes [provider]  traZODone (DESYREL) 50 MG tablet Take 1 tablet (50 mg total) by mouth at bedtime. 12/15/16  Yes Burns, Arloa Koh, MD  Vitamin D, Ergocalciferol, (DRISDOL) 1.25 MG (50000 UT) CAPS capsule Take 50,000 Units by mouth once a week. mondays 08/15/19  Yes [provider]  XTAMPZA ER 27 MG C12A Take 27 mg by mouth 2 (two) times daily. Patient not taking: Reported on 04/27/2020 08/15/19   [provider]    Physical Exam: Vitals:   04/27/20 1911 04/27/20 1959 04/27/20 2100 04/27/20 2229  BP: (!) 152/78 (!) 166/77 (!) 151/81 (!) 167/79  Pulse: 79 79 81 81  Resp: 20 (!) 23 (!) 25 (!) 25  Temp:      TempSrc:      SpO2: 96% 98% 97% 96%    Constitutional: Acute alert and oriented x3,  no associated distress.  Patient is obese. Skin: no rashes, no lesions, slightly poor skin turgor noted.  Pigmented areas noted over the anterior surfaces of bilateral legs.   Eyes: Pupils are equally  reactive to light.  No evidence of scleral icterus or conjunctival pallor.  ENMT: Moist mucous membranes noted.  Posterior pharynx clear of any exudate or lesions.   Neck: normal, supple, no masses, no thyromegaly.  No evidence of jugular venous distension.   Respiratory: clear to auscultation bilaterally, no wheezing, no crackles. Normal respiratory effort. No accessory muscle use.  Cardiovascular: Regular rate and rhythm, no murmurs / rubs / gallops. No extremity edema. 2+ pedal pulses. No carotid bruits.  Chest:   Nontender without crepitus or deformity.   Back:   Nontender without crepitus or deformity. Abdomen: Notable epigastric and right upper quadrant tenderness.  Abdomen is protuberant and soft.  No evidence of intra-abdominal masses.  Positive bowel sounds noted in all quadrants.   Musculoskeletal: No joint deformity upper and lower extremities. Good ROM, no contractures. Normal muscle tone.  Neurologic: CN 2-12 grossly intact. Sensation intact, strength noted to be 5 out of 5 in all 4 extremities.  Patient is following all commands.  Patient is responsive to verbal stimuli.   Psychiatric: Patient presents as a normal mood with appropriate affect.  Patient seems to possess insight as to theircurrent situation.     Labs on Admission: I have personally reviewed following labs and imaging studies -   CBC: Recent Labs  Lab 04/27/20 1509 04/27/20 2031  WBC 8.3  --   HGB 11.2* 12.9*  HCT 35.8* 38.0*  MCV 87.3  --   PLT 217  --    Basic Metabolic Panel: Recent Labs  Lab 04/27/20 1509 04/27/20 2031  NA 135 139  K 3.9 3.7  CL 99  --   CO2 18*  --   GLUCOSE 311*  --   BUN 12  --   CREATININE 0.90  --   CALCIUM 9.1  --    GFR: CrCl cannot be calculated (Unknown ideal  weight.). Liver Function Tests: Recent Labs  Lab 04/27/20 1509  AST 22  ALT 18  ALKPHOS 54  BILITOT 0.6  PROT 6.5  ALBUMIN 3.5   No results for input(s): LIPASE, AMYLASE in the last 168 hours. No results for input(s): AMMONIA in the last 168 hours. Coagulation Profile: No results for input(s): INR, PROTIME in the last 168 hours. Cardiac Enzymes: No results for input(s): CKTOTAL, CKMB, CKMBINDEX, TROPONINI in the last 168 hours. BNP (last 3 results) No results for input(s): PROBNP in the last 8760 hours. HbA1C: No results for input(s): HGBA1C in the last 72 hours. CBG: Recent Labs  Lab 04/28/20 0015  GLUCAP 229*   Lipid Profile: No results for input(s): CHOL, HDL, LDLCALC, TRIG, CHOLHDL, LDLDIRECT in the last 72 hours. Thyroid Function Tests: No results for input(s): TSH, T4TOTAL, FREET4, T3FREE, THYROIDAB in the last 72 hours. Anemia Panel: No results for input(s): VITAMINB12, FOLATE, FERRITIN, TIBC, IRON, RETICCTPCT in the last 72 hours. Urine analysis:    Component Value Date/Time   COLORURINE YELLOW 07/31/2019 2035   APPEARANCEUR CLEAR 07/31/2019 2035   LABSPEC 1.017 07/31/2019 2035   PHURINE 5.0 07/31/2019 2035   GLUCOSEU >=500 (A) 07/31/2019 2035   HGBUR NEGATIVE 07/31/2019 2035   BILIRUBINUR NEGATIVE 07/31/2019 2035   KETONESUR NEGATIVE 07/31/2019 2035   PROTEINUR NEGATIVE 07/31/2019 2035   UROBILINOGEN 0.2 05/27/2014 2312   NITRITE NEGATIVE 07/31/2019 2035   LEUKOCYTESUR NEGATIVE 07/31/2019 2035    Radiological Exams on Admission - Personally Reviewed: CT ABDOMEN PELVIS W CONTRAST  Result Date: 04/27/2020 CLINICAL DATA:  59 year old male with abdominal pain. Concern for acute  diverticulitis. EXAM: CT ABDOMEN AND PELVIS WITH CONTRAST TECHNIQUE: Multidetector CT imaging of the abdomen and pelvis was performed using the standard protocol following bolus administration of intravenous contrast. CONTRAST:  130mL OMNIPAQUE IOHEXOL 300 MG/ML  SOLN COMPARISON:  CT  abdomen pelvis dated 07/31/2019. FINDINGS: Lower chest: The visualized lung bases are clear. No intra-abdominal free air or free fluid. Hepatobiliary: There is diffuse fatty infiltration of the liver. The liver is mildly enlarged. There is minimal apparent irregularity of the liver contour which may represent early changes of cirrhosis. Correlation with clinical exam and LFTs recommended. No intrahepatic biliary ductal dilatation. The gallbladder is unremarkable. Pancreas: Unremarkable. No pancreatic ductal dilatation or surrounding inflammatory changes. Spleen: Similar areas of hypoenhancement in the spleen as seen on the prior CT and CT dating back to 2017. This is not characterized on this CT but likely a benign or indolent process, possibly hemangiomas. This can be better evaluated with ultrasound on a nonemergent basis if clinically indicated. Adrenals/Urinary Tract: The adrenal glands are unremarkable. There is no hydronephrosis on either side. There is symmetric enhancement and excretion of contrast by both kidneys. The visualized ureters and urinary bladder appear unremarkable. Stomach/Bowel: Small scattered colonic diverticula without active inflammatory changes. There is no bowel obstruction or active inflammation. The appendix is normal. Vascular/Lymphatic: Mild aortoiliac atherosclerotic disease. The IVC is unremarkable. No portal venous gas. There is no adenopathy. Reproductive: The prostate and seminal vesicles are grossly unremarkable. Other: None Musculoskeletal: Degenerative changes of the spine. No acute osseous pathology. IMPRESSION: 1. No bowel obstruction. Normal appendix. 2. Colonic diverticulosis. 3. Fatty liver with possible steatohepatitis and early changes of cirrhosis. Correlation with clinical exam and LFTs recommended. 4. Aortic Atherosclerosis (ICD10-I70.0). Electronically Signed   By: Anner Crete M.D.   On: 04/27/2020 21:25    Telemetry: Bedside telemetry personally reviewed.   Rhythm is normal sinus rhythm 90 bpm.   Assessment/Plan Principal Problem:   Acute GI bleeding   Patient presenting with 2-week history of symptoms with both bright red blood per rectum and occasional hematemesis.  Abdomen reveals significant epigastric tenderness  Considering patient's epigastric tenderness and onset of symptoms shortly after eating ribs I feel that patient's bleeding may be secondary to gastritis or a bleeding gastric ulcer.  Patient denies alcohol use.  Home regimen of daily aspirin has been temporarily held  Coagulation profile ordered  Patient is already been given 80 mg of IV Protonix by the emergency department staff, I will continue 40 mg IV every 12.  Gentle intravenous hydration to avoid volume overload considering history of diastolic congestive heart failure  Case is already been discussed with Dr. Alessandra Bevels neurology who stated that they will consult on the patient in the morning for possible endoscopic intervention.  Patient n.p.o. for now  Active Problems:   Uncontrolled type 2 diabetes mellitus with hyperglycemia [facility])   Patient presenting with initial chemistry revealing glucose over 300, slightly decreased bicarbonate  Emergency department provider did order a beta hydroxybutyrate level which is slightly elevated which could be interpreted as mild DKA   however based on patient's presentation I will instead attempt to hydrate patient gently and provide patient with home basal insulin therapy.  We will then obtain repeat chemistry - if anion gap is widening we will then initiate insulin infusion.  Accu-Cheks every 4 hours while n.p.o. for now with high intensity sliding scale insulin    Hypertensive urgency   Restarting home regimen of antihypertensive therapy  Providing patient with as needed intravenous  antihypertensives for excessively elevated blood pressures.    Coronary artery disease involving native coronary artery of native  heart without angina pectoris   Patient is chest pain-free, telemetry unremarkable  EKG ordered  Temporarily holding aspirin therapy due to concerns for gastrointestinal bleeding  Continuing home regimen of lipid-lowering therapy and AV nodal blocking agent    Chronic diastolic CHF (congestive heart failure) (HCC)   No evidence of cardiogenic volume overload at this time  We will continue home regimen of diuretic therapy tomorrow    Mixed diabetic hyperlipidemia associated with type 2 diabetes mellitus ([place])  Continue home regimen of lipid-lowering therapy    GERD (gastroesophageal reflux disease)    Intravenous PPI as noted above.   Code Status:  Full code Family Communication: deferred   Status is: Observation  The patient remains OBS appropriate and will d/c before 2 midnights.  Dispo: The patient is from: Home              Anticipated d/c is to: Home              Anticipated d/c date is: 2 days              Patient currently is not medically stable to d/c.        Vernelle Emerald MD [facility] Pager [phone]  If 7PM-7AM, please contact night-coverage [email or web address] Use universal [place] password for that web site. If you do not have the password, please call the hospital operator.  04/28/2020, 12:20 AM

## 2020-04-28 NOTE — ED Notes (Signed)
Attempted to call floor for report x 1

## 2020-04-28 NOTE — Progress Notes (Signed)
[facility] OF CARE NOTE Patient: [name]:[id number]   PCP: [name], FNP DOB: 1960-12-02   DOA: 04/27/2020   DOS: 04/28/2020    Patient was admitted by my colleague Dr. Cyd Silence earlier on 04/28/2020. I have reviewed the H&P as well as assessment and plan and agree with the same. Important changes in the plan are listed below.  Plan of care: Principal Problem:   Acute GI bleeding Active Problems:   Coronary artery disease involving native coronary artery of native heart without angina pectoris   GERD (gastroesophageal reflux disease)   Uncontrolled type 2 diabetes mellitus with hyperglycemia (HCC)   Chronic diastolic CHF (congestive heart failure) ([place])   Hypertensive urgency   Mixed diabetic hyperlipidemia associated with type 2 diabetes mellitus ([place])   Upper GI bleed  EGD coloscopy tomorrow.   Author: Berle Mull, MD [facility] 04/28/2020 7:10 PM   If 7PM-7AM, please contact night-coverage at [email or web address]

## 2020-04-28 NOTE — Progress Notes (Addendum)
Inpatient Diabetes Program Recommendations  AACE/ADA: New Consensus Statement on Inpatient Glycemic Control (2015)  Target Ranges:  Prepandial:   less than 140 mg/dL      Peak postprandial:   less than 180 mg/dL (1-2 hours)      Critically ill patients:  140 - 180 mg/dL   Results for [name], [name] (MRN [id number]) as of 04/28/2020 07:32  Ref. Range 04/28/2020 00:15 04/28/2020 03:41  Glucose-Capillary Latest Ref Range: 70 - 99 mg/dL 229 (H)  7 units NOVOLOG +  140 units LANTUS  228 (H)  7 units NOVOLOG    Results for [name], [name] (MRN [id number]) as of 04/28/2020 07:32  Ref. Range 05/05/2019 03:34 10/17/2019 08:36 04/28/2020 02:40  Hemoglobin A1C Latest Ref Range: 4.8 - 5.6 % 10.5 (H) 11.6 (H) 12.4 (H)  (309 mg/dl)    Admit with: Acute GI Bleed  History: DM, CHF  Home DM Meds: Glipizide 10 mg BID       Tresiba 140 units QHS       Novolog 36-40 units TID per SSI  Current Orders: Lantus 140 units QHS      Novolog Resistant Correction Scale/ SSI (0-20 units) Q4 hours    Note Lantus and Novolog SSI started last PM.  NPO this AM.  MD- Note patient will be NPO after 12am for GI Procedures tomorrow (07/07).  May consider modest reduction of Lantus for tonight--Could try 75% total dose tonight (Lantus 100 units QHS) for tonight only    Endocrinologist: Peri Jefferson, PA with [facility] in [place]--last seen in office 02/03/2020--Was told to Continue the Glipizide, Increase the Tresiba Insulin to 130 units QHS, and Increase the Novolog to Custom scale based on his CBG TID with meals (18-46 units TID per the scale)--Per the ENDO notes, pt may need to switch to U500 Insulin based on the large amount of insulin that he is requiring.     Addendum 11:30am--Met w/ pt at bedside down in the ED this AM.  Pt verified all his insulin doses listed above.  Explained to pt we have started him on his home dose of long-acting insulin but had to switch to Lantus since we do not carry  [place].  Also explained how we are dosing the Novolog currently.  Reviewed current A1c of 12.4% with pt--pt told me he os actively working with his OP ENDO team.  Needs to schedule follow up appt with ENDO after d/c.  Pt did not have any questions for me at this time about his diabetes care.  --Will follow patient during hospitalization--  Wyn Quaker RN, MSN, CDE Diabetes Coordinator Inpatient Glycemic Control Team Team Pager: [phone] (8a-5p)

## 2020-04-28 NOTE — H&P (View-Only) (Signed)
Referring Provider: Dr. Cyd Silence Primary Care Physician:  [name], FNP Primary Gastroenterologist:  [facility]  Reason for Consultation:  Hematemesis, rectal bleeding  HPI: [name] is a 59 y.o. male with an extensive past medical history noted below to include CHF, CAD (s/p drug eluting stent 05/2016), type 2 DM (on insulin), CHF (EF 60-65% on 03/2019), DVT/PE (not currently on anticoagulation), morbid obesity, and IBS-D presenting with hematemesis and rectal bleeding.  Patient reports that over the last 15 days, he has had intermittent epigastric pain, nausea, and hematemesis.  He states the emesis consists of both black and red blood.  He had 2 episodes of emesis yesterday and also had one nosebleed.  Food seems to exacerbate his symptoms.  He has also noted some recent rectal bleeding, stating that his bowel movements have both black and red blood, though he did have small amounts of bright red blood per rectum preceding his most recent episodes of hematemesis. He has intermittent nausea and vomiting for which he takes Zofran.    Patient also notes dysphagia to solids, feeling as if food gets stuck in his lower esophagus/chest.  Reports history of GERD, which is currently well-controlled with 40mg  Protonix once daily.  He has also noted some decreased appetite but denies any unexplained weight loss.  He also reports some recent fatigue, shortness of breath, and chest pain.  Patient reports chronic intermittent diarrhea, which she attributes to IBS-D.  On bad days or when he is stressed, he has 3-4 loose bowel movements per day.  He denies any constipation.  He takes 81 mg aspirin daily but denies any NSAID or blood thinner use.  Denies alcohol use.  Denies any family history of colon cancer or gastrointestinal malignancies.  Denies any family history of bleeding disorders.  Per chart review, patient had similar symptoms in 2017 and had an EGD at an outside hospital in 2017 which  showed mild gastritis.  He also had a colonoscopy in 2017 which revealed internal hemorrhoids and one adenomatous polyp.  Past Medical History:  Diagnosis Date  . Anemia   . Arthritis   . Back pain   . CAD (coronary artery disease)    a. s/p DES to LAD in 05/2016  . Cervical radiculopathy   . Chronic diastolic CHF (congestive heart failure) ([facility])   . Chronic pain   . Depression   . DVT (deep venous thrombosis) ([place])   . Hematemesis   . Hepatic steatosis   . Hyperlipidemia   . Hypertension   . IBS (irritable bowel syndrome)   . Morbid obesity ([place])   . OSA (obstructive sleep apnea)   . Pancreatitis   . PE (pulmonary thromboembolism) ([place])   . Peripheral neuropathy   . PUD (peptic ulcer disease)   . Renal disorder   . Stroke [facility])    a. ?details unclear - not seen on imaging when he was admitted in 05/2017 for TIA symptoms which were felt due to cervical radiculopathy.  . Thoracic aortic ectasia ([facility])    a. 4.3cm ectatic ascending thoracic aorta by CT 06/2017.   . Type 2 diabetes mellitus ([place])     Past Surgical History:  Procedure Laterality Date  . CARDIAC CATHETERIZATION N/A 05/31/2016   Procedure: Left Heart Cath and Coronary Angiography;  Surgeon: Peter M Martinique, MD;  Location: [place] [facility];  Service: Cardiovascular;  Laterality: N/A;  . CARDIAC CATHETERIZATION N/A 05/31/2016   Procedure: Intravascular Pressure Wire/FFR Study;  Surgeon: Ander Slade  Martinique, MD;  Location: [place] [facility];  Service: Cardiovascular;  Laterality: N/A;  . CARDIAC CATHETERIZATION N/A 05/31/2016   Procedure: Coronary Stent Intervention;  Surgeon: Peter M Martinique, MD;  Location: [place] [facility];  Service: Cardiovascular;  Laterality: N/A;  . LEFT HEART CATH AND CORONARY ANGIOGRAPHY N/A 12/08/2017   Procedure: LEFT HEART CATH AND CORONARY ANGIOGRAPHY;  Surgeon: Leonie Man, MD;  Location: [place] [facility];  Service: Cardiovascular;  Laterality: N/A;  . LEFT HEART CATHETERIZATION WITH  CORONARY ANGIOGRAM N/A 02/03/2014   Procedure: LEFT HEART CATHETERIZATION WITH CORONARY ANGIOGRAM;  Surgeon: Pixie Casino, MD;  Location: [facility] CATH LAB;  Service: Cardiovascular;  Laterality: N/A;  . left leg stent       Prior to Admission medications   Medication Sig Start Date End Date Taking? Authorizing Provider  Accu-Chek FastClix Lancets MISC 1 Device by Other route 2 (two) times daily.  03/20/19  Yes [provider]  Albuterol Sulfate (PROAIR RESPICLICK) 629 (90 Base) MCG/ACT AEPB Inhale 2 puffs into the lungs every 6 (six) hours as needed. Patient taking differently: Inhale 2 puffs into the lungs every 6 (six) hours as needed (for breathing).  11/06/18  Yes Robyn [facility]er, MD  allopurinol (ZYLOPRIM) 100 MG tablet Take 100 mg by mouth daily. 03/31/20  Yes [provider]  aspirin 81 MG chewable tablet Chew 1 tablet (81 mg total) by mouth daily. 06/01/16  Yes Elwin Mocha, MD  atorvastatin (LIPITOR) 40 MG tablet Take 1 tablet (40 mg total) by mouth daily. 02/12/20 05/12/20 Yes Hilty, Nadean Corwin, MD  carvedilol (COREG) 25 MG tablet Take 1 tablet (25 mg total) by mouth 2 (two) times daily with a meal. 01/26/17  Yes Strader, [place] M, PA-C  [employer] VITAMIN B12 1000 MCG tablet Take 1,000 mcg by mouth daily. 03/06/20  Yes [provider]  diclofenac (FLECTOR) 1.3 % PTCH Place 1 patch onto the skin daily as needed for pain. 03/02/20  Yes [provider]  DULoxetine (CYMBALTA) 60 MG capsule Take 1 capsule (60 mg total) by mouth daily. Please request future refills from PCP. 11/28/19  Yes Marcial Pacas, MD  famotidine (PEPCID) 20 MG tablet Take 20 mg by mouth daily. 03/05/20  Yes [provider]  fenofibrate (TRICOR) 145 MG tablet Take 1 tablet (145 mg total) by mouth daily. 03/03/20  Yes Hilty, Nadean Corwin, MD  furosemide (LASIX) 40 MG tablet Take 1 tablet (40 mg total) by mouth 3 (three) times daily. 08/23/19 08/22/20 Yes Pahwani, Einar Grad, MD  glipiZIDE (GLUCOTROL) 10 MG  tablet Take 10 mg by mouth 2 (two) times daily. 05/30/17  Yes [provider]  Insulin Degludec 200 UNIT/ML SOPN Inject 140 Units as directed at bedtime.  10/16/19  Yes [provider]  Insulin Pen Needle 31G X 5 MM MISC Use 1 needle daily to inject insulin as prescribed Patient taking differently: 1 each by Other route See admin instructions. Use 1 needle daily to inject insulin as prescribed 06/18/17  Yes Barton Dubois, MD  isosorbide mononitrate (IMDUR) 30 MG 24 hr tablet Take 1 tablet (30 mg total) by mouth daily. 10/16/19  Yes Lendon Colonel, NP  lisinopril (ZESTRIL) 5 MG tablet Take 5 mg by mouth daily. 03/05/20  Yes [provider]  nitroGLYCERIN (NITROSTAT) 0.4 MG SL tablet Place 1 tablet (0.4 mg total) under the tongue every 5 (five) minutes as needed for chest pain. 02/06/19  Yes Skeet Latch, MD  NOVOLOG FLEXPEN 100 UNIT/ML FlexPen  Inject 36-40 Units into the skin 3 (three) times daily with meals. Per sliding scale 05/13/19  Yes [provider]  ondansetron (ZOFRAN) 4 MG tablet Take 4 mg by mouth 2 (two) times daily as needed for nausea/vomiting. 04/21/20  Yes [provider]  oxyCODONE (ROXICODONE) 15 MG immediate release tablet Take 15 mg 3 (three) times daily as needed by mouth for pain.  08/28/17  Yes [provider]  oxyCODONE ER (XTAMPZA ER) 36 MG C12A Take 36 mg by mouth 2 (two) times daily.   Yes [provider]  pantoprazole (PROTONIX) 40 MG tablet Take 40 mg by mouth daily. 08/12/19  Yes [provider]  potassium chloride (MICRO-K) 10 MEQ CR capsule Take 10 mEq by mouth in the morning and at bedtime. 04/03/20  Yes [provider]  pregabalin (LYRICA) 300 MG capsule Take 300 mg by mouth 2 (two) times daily. 05/18/19  Yes [provider]  sertraline (ZOLOFT) 50 MG tablet Take 50 mg by mouth daily.   Yes [provider]  tiZANidine (ZANAFLEX) 4 MG tablet Take 4 mg by mouth 3 (three)  times daily as needed for muscle spasms. 03/31/20  Yes [provider]  traZODone (DESYREL) 50 MG tablet Take 1 tablet (50 mg total) by mouth at bedtime. 12/15/16  Yes Burns, Arloa Koh, MD  Vitamin D, Ergocalciferol, (DRISDOL) 1.25 MG (50000 UT) CAPS capsule Take 50,000 Units by mouth once a week. mondays 08/15/19  Yes [provider]  XTAMPZA ER 27 MG C12A Take 27 mg by mouth 2 (two) times daily. Patient not taking: Reported on 04/27/2020 08/15/19   [provider]    Scheduled Meds: . allopurinol  100 mg Oral Daily  . atorvastatin  40 mg Oral Daily  . carvedilol  25 mg Oral BID WC  . cyanocobalamin  1,000 mcg Subcutaneous Once  . DULoxetine  60 mg Oral Daily  . fenofibrate  160 mg Oral Daily  . ferrous sulfate  325 mg Oral BID WC  . folic acid  1 mg Oral Daily  . furosemide  40 mg Oral TID WC  . insulin aspart  0-20 Units Subcutaneous Q4H  . insulin glargine  140 Units Subcutaneous Q2200  . isosorbide mononitrate  30 mg Oral Daily  . lisinopril  5 mg Oral Daily  . oxyCODONE  40 mg Oral BID  . pantoprazole (PROTONIX) IV  40 mg Intravenous Q12H  . pregabalin  300 mg Oral BID  . sertraline  50 mg Oral Daily  . traZODone  50 mg Oral QHS   Continuous Infusions: . sodium chloride 100 mL/hr at 04/28/20 0031   PRN Meds:.acetaminophen **OR** acetaminophen, albuterol, hydrALAZINE, nitroGLYCERIN, ondansetron **OR** ondansetron (ZOFRAN) IV, oxyCODONE, polyethylene glycol, tiZANidine  Allergies as of 04/27/2020 - Review Complete 04/27/2020  Allergen Reaction Noted  . Coconut flavor [flavoring agent] Hives 01/26/2017  . Coconut oil Hives 03/09/2016  . Ibuprofen Other (See Comments) 01/29/2014  . Aleve [naproxen] Other (See Comments) 09/02/2017  . Nsaids Other (See Comments) 12/27/2017  . Vascepa [icosapent ethyl] Other (See Comments) 09/30/2019    Family History  Problem Relation Age of Onset  . Cancer Father   . Hypertension Mother   . Diabetes Mother   .  Breast cancer Mother   . Hypertension Brother   . Diabetes Brother   . Hypertension Sister   . Diabetes Sister     Social History   Socioeconomic History  . Marital status: Single    Spouse name:  Not on file  . Number of children: 3  . Years of education: 32  . Highest education level: Not on file  Occupational History  . Occupation: Disabled  Tobacco Use  . Smoking status: Never Smoker  . Smokeless tobacco: Never Used  Vaping Use  . Vaping Use: Never used  Substance and Sexual Activity  . Alcohol use: No  . Drug use: No  . Sexual activity: Not on file  Other Topics Concern  . Not on file  Social History Narrative   Independent and ambulatory with cane.   Lives at home alone.   Right-handed.   1-2 cups caffeine per day.   Social Determinants of Health   Financial Resource Strain:   . Difficulty of Paying Living Expenses:   Food Insecurity:   . Worried About Charity fundraiser in the Last Year:   . Arboriculturist in the Last Year:   Transportation Needs:   . Film/video editor (Medical):   Marland Kitchen Lack of Transportation (Non-Medical):   Physical Activity:   . Days of Exercise per Week:   . Minutes of Exercise per Session:   Stress:   . Feeling of Stress :   Social Connections:   . Frequency of Communication with Friends and Family:   . Frequency of Social Gatherings with Friends and Family:   . Attends Religious Services:   . Active Member of Clubs or Organizations:   . Attends Archivist Meetings:   Marland Kitchen Marital Status:   Intimate Partner Violence:   . Fear of Current or Ex-Partner:   . Emotionally Abused:   Marland Kitchen Physically Abused:   . Sexually Abused:     Review of Systems:Review of Systems  Constitutional: Positive for malaise/fatigue. Negative for chills, fever and weight loss.  HENT: Negative for hearing loss and tinnitus.   Eyes: Negative for pain and redness.  Respiratory: Positive for shortness of breath. Negative for cough.    Cardiovascular: Positive for chest pain. Negative for palpitations.  Gastrointestinal: Positive for abdominal pain, blood in stool, diarrhea, melena, nausea and vomiting. Negative for constipation and heartburn.  Genitourinary: Negative for flank pain and hematuria.  Musculoskeletal: Negative for falls and joint pain.  Skin: Negative for itching and rash.  Neurological: Negative for seizures and loss of consciousness.  Endo/Heme/Allergies: Negative for polydipsia. Does not bruise/bleed easily.  Psychiatric/Behavioral: Negative for substance abuse. The patient is not nervous/anxious.      Physical Exam: Vital signs: Vitals:   04/28/20 0500 04/28/20 0749  BP: 140/70 (!) 143/85  Pulse: 71 66  Resp: 11 14  Temp:    SpO2: 96% 99%     Physical Exam Constitutional:      General: He is not in acute distress.    Appearance: He is obese.  HENT:     Head: Normocephalic and atraumatic.     Nose: Nose normal.     Comments: Nasal cannula in place    Mouth/Throat:     Mouth: Mucous membranes are moist.     Pharynx: Oropharynx is clear.  Eyes:     General: No scleral icterus.    Extraocular Movements: Extraocular movements intact.     Conjunctiva/sclera: Conjunctivae normal.  Cardiovascular:     Rate and Rhythm: Normal rate and regular rhythm.     Pulses: Normal pulses.     Heart sounds: Normal heart sounds.  Pulmonary:     Effort: Pulmonary effort is normal. No respiratory distress.  Abdominal:  General: Bowel sounds are normal. There is distension.     Palpations: There is no mass.     Tenderness: There is abdominal tenderness (mild: epigastric, RUQ, LUQ). There is no guarding or rebound.     Hernia: No hernia is present.     Comments: Somewhat firm but without peritoneal signs  Musculoskeletal:        General: No swelling or tenderness.     Cervical back: Normal range of motion and neck supple.     Comments: Chronic venous insufficiency  Skin:    General: Skin is warm  and dry.  Neurological:     General: No focal deficit present.     Mental Status: He is oriented to person, place, and time. He is lethargic.  Psychiatric:        Mood and Affect: Mood normal.        Behavior: Behavior normal.    GI:  Lab Results: Recent Labs    04/27/20 1509 04/27/20 2031 04/28/20 0240  WBC 8.3  --  7.5  HGB 11.2* 12.9* 11.2*  HCT 35.8* 38.0* 36.2*  PLT 217  --  198   BMET Recent Labs    04/27/20 1509 04/27/20 2031 04/28/20 0240  NA 135 139 137  K 3.9 3.7 3.6  CL 99  --  100  CO2 18*  --  24  GLUCOSE 311*  --  242*  BUN 12  --  12  CREATININE 0.90  --  0.73  CALCIUM 9.1  --  8.6*   LFT Recent Labs    04/28/20 0240  PROT 5.9*  ALBUMIN 3.5  AST 24  ALT 16  ALKPHOS 53  BILITOT 0.7   PT/INR Recent Labs    04/28/20 0240  LABPROT 12.8  INR 1.0     Studies/Results: CT ABDOMEN PELVIS W CONTRAST  Result Date: 04/27/2020 CLINICAL DATA:  59 year old male with abdominal pain. Concern for acute diverticulitis. EXAM: CT ABDOMEN AND PELVIS WITH CONTRAST TECHNIQUE: Multidetector CT imaging of the abdomen and pelvis was performed using the standard protocol following bolus administration of intravenous contrast. CONTRAST:  122mL OMNIPAQUE IOHEXOL 300 MG/ML  SOLN COMPARISON:  CT abdomen pelvis dated 07/31/2019. FINDINGS: Lower chest: The visualized lung bases are clear. No intra-abdominal free air or free fluid. Hepatobiliary: There is diffuse fatty infiltration of the liver. The liver is mildly enlarged. There is minimal apparent irregularity of the liver contour which may represent early changes of cirrhosis. Correlation with clinical exam and LFTs recommended. No intrahepatic biliary ductal dilatation. The gallbladder is unremarkable. Pancreas: Unremarkable. No pancreatic ductal dilatation or surrounding inflammatory changes. Spleen: Similar areas of hypoenhancement in the spleen as seen on the prior CT and CT dating back to 2017. This is not characterized  on this CT but likely a benign or indolent process, possibly hemangiomas. This can be better evaluated with ultrasound on a nonemergent basis if clinically indicated. Adrenals/Urinary Tract: The adrenal glands are unremarkable. There is no hydronephrosis on either side. There is symmetric enhancement and excretion of contrast by both kidneys. The visualized ureters and urinary bladder appear unremarkable. Stomach/Bowel: Small scattered colonic diverticula without active inflammatory changes. There is no bowel obstruction or active inflammation. The appendix is normal. Vascular/Lymphatic: Mild aortoiliac atherosclerotic disease. The IVC is unremarkable. No portal venous gas. There is no adenopathy. Reproductive: The prostate and seminal vesicles are grossly unremarkable. Other: None Musculoskeletal: Degenerative changes of the spine. No acute osseous pathology. IMPRESSION: 1. No bowel obstruction. Normal appendix. 2.  Colonic diverticulosis. 3. Fatty liver with possible steatohepatitis and early changes of cirrhosis. Correlation with clinical exam and LFTs recommended. 4. Aortic Atherosclerosis (ICD10-I70.0). Electronically Signed   By: Anner Crete M.D.   On: 04/27/2020 21:25    Impression: Suspected upper GI bleeding: hematemesis and melena.  Gastritis vs. esophagitis vs. PUD vs. MWT -Hgb 11.2, stable as compared to baseline -Iron deficiency anemia (Iron 32, saturation 8%) -BUN 12/Cr 0.73  Rectal bleeding: sounds hemorrhoidal, though patient has history of adenomatous polyp (2017) and is due for repeat colonoscopy.  Also has IDA as noted above.  Fatty liver per CT 04/27/20 -LFTs WNL: T. Bili 0.7/ AST 24/ ALT16/ ALP 53  CHF: EF 60-65% as of 03/2019  Plan: EGD and colonoscopy tomorrow with Dr. Michail Sermon.  I thoroughly reviewed the procedures with the patient including nature, alternatives, benefits, and risks (including but not limited to bleeding, infection, perforation, anesthesia  complications/cardiac and pulmonary complications).  Patient verbalized understanding and gave verbal consent to proceed with EGD and colonoscopy tomorrow.  Continue antiemetics as needed, especially during prep.  Continue Protonix 40 mg IV twice daily.  Continue to monitor H&H with transfusion as needed to maintain hemoglobin greater than 8.  [facility] will follow.   LOS: 0 days   Salley Slaughter  PA-C 04/28/2020, 8:23 AM  Contact #  [phone]

## 2020-04-29 ENCOUNTER — Encounter (HOSPITAL_COMMUNITY)
Admission: EM | Disposition: A | Discharge status: Home / Self Care | Payer: Self-pay | Source: Home / Self Care | Attending: Internal Medicine

## 2020-04-29 ENCOUNTER — Other Ambulatory Visit: Payer: Self-pay

## 2020-04-29 ENCOUNTER — Inpatient Hospital Stay (HOSPITAL_COMMUNITY): Payer: Medicaid Other | Admitting: Certified Registered Nurse Anesthetist

## 2020-04-29 ENCOUNTER — Encounter (HOSPITAL_COMMUNITY): Payer: Self-pay | Admitting: Internal Medicine

## 2020-04-29 HISTORY — PX: POLYPECTOMY: SHX5525

## 2020-04-29 HISTORY — PX: COLONOSCOPY WITH PROPOFOL: SHX5780

## 2020-04-29 HISTORY — PX: ESOPHAGOGASTRODUODENOSCOPY: SHX5428

## 2020-04-29 LAB — CBC
HCT: 35.1 % — ABNORMAL LOW (ref 39.0–52.0)
Hemoglobin: 10.9 g/dL — ABNORMAL LOW (ref 13.0–17.0)
MCH: 26.9 pg (ref 26.0–34.0)
MCHC: 31.1 g/dL (ref 30.0–36.0)
MCV: 86.7 fL (ref 80.0–100.0)
Platelets: 193 10*3/uL (ref 150–400)
RBC: 4.05 MIL/uL — ABNORMAL LOW (ref 4.22–5.81)
RDW: 15.1 % (ref 11.5–15.5)
WBC: 7.6 10*3/uL (ref 4.0–10.5)
nRBC: 0 % (ref 0.0–0.2)

## 2020-04-29 LAB — GLUCOSE, CAPILLARY
Glucose-Capillary: 103 mg/dL — ABNORMAL HIGH (ref 70–99)
Glucose-Capillary: 121 mg/dL — ABNORMAL HIGH (ref 70–99)
Glucose-Capillary: 143 mg/dL — ABNORMAL HIGH (ref 70–99)
Glucose-Capillary: 201 mg/dL — ABNORMAL HIGH (ref 70–99)
Glucose-Capillary: 224 mg/dL — ABNORMAL HIGH (ref 70–99)
Glucose-Capillary: 228 mg/dL — ABNORMAL HIGH (ref 70–99)

## 2020-04-29 SURGERY — EGD (ESOPHAGOGASTRODUODENOSCOPY)
Anesthesia: Monitor Anesthesia Care

## 2020-04-29 MED ORDER — LACTATED RINGERS IV SOLN
INTRAVENOUS | Status: AC | PRN
  Administered 2020-04-29: 1000 mL via INTRAVENOUS

## 2020-04-29 MED ORDER — SODIUM CHLORIDE 0.9 % IV SOLN
510.0000 mg | Freq: Once | INTRAVENOUS | Status: AC
  Administered 2020-04-29: 510 mg via INTRAVENOUS
  Filled 2020-04-29: qty 17

## 2020-04-29 MED ORDER — PROPOFOL 10 MG/ML IV BOLUS
INTRAVENOUS | Status: DC | PRN
  Administered 2020-04-29: 30 mg via INTRAVENOUS
  Administered 2020-04-29: 40 mg via INTRAVENOUS
  Administered 2020-04-29: 30 mg via INTRAVENOUS

## 2020-04-29 MED ORDER — PROPOFOL 500 MG/50ML IV EMUL
INTRAVENOUS | Status: DC | PRN
  Administered 2020-04-29: 75 ug/kg/min via INTRAVENOUS

## 2020-04-29 MED ORDER — PHENYLEPHRINE HCL (PRESSORS) 10 MG/ML IV SOLN
INTRAVENOUS | Status: DC | PRN
  Administered 2020-04-29 (×3): 80 ug via INTRAVENOUS

## 2020-04-29 SURGICAL SUPPLY — 22 items

## 2020-04-29 NOTE — Op Note (Signed)
[facility] Patient Name: [name] Procedure Date : 04/29/2020 MRN: [id number] Attending MD: Lear Ng , MD Date of Birth: 06-12-1961 CSN: [id number] Age: 59 Admit Type: Inpatient Procedure:                Upper GI endoscopy Indications:              Iron deficiency anemia, Hematemesis Providers:                Lear Ng, MD, Glori Bickers, RN, Elspeth Cho Tech., Technician Referring MD:             hospital team Medicines:                Propofol per Anesthesia, Monitored Anesthesia Care Complications:            No immediate complications. Estimated Blood Loss:     Estimated blood loss: none. Procedure:                Pre-Anesthesia Assessment:                           - Prior to the procedure, a History and Physical                            was performed, and patient medications and                            allergies were reviewed. The patient's tolerance of                            previous anesthesia was also reviewed. The risks                            and benefits of the procedure and the sedation                            options and risks were discussed with the patient.                            All questions were answered, and informed consent                            was obtained. Prior Anticoagulants: The patient has                            taken no previous anticoagulant or antiplatelet                            agents. ASA Grade Assessment: III - A patient with                            severe systemic disease. After reviewing the risks  and benefits, the patient was deemed in                            satisfactory condition to undergo the procedure.                           After obtaining informed consent, the endoscope was                            passed under direct vision. Throughout the                            procedure, the patient's blood  pressure, pulse, and                            oxygen saturations were monitored continuously. The                            [id number]-[id number] ([id number] Olympus gastroscope was                            introduced through the mouth, and advanced to the                            second part of duodenum. The upper GI endoscopy was                            accomplished without difficulty. The patient                            tolerated the procedure well. Scope In: Scope Out: Findings:      The examined esophagus was normal.      The Z-line was regular and was found 45 cm from the incisors.      Segmental mild inflammation characterized by congestion (edema) and       erythema was found in the gastric antrum.      The cardia and gastric fundus were normal on retroflexion.      Segmental mild mucosal changes characterized by congestion and erythema       were found in the duodenal bulb.      The exam of the duodenum was otherwise normal. Impression:               - Normal esophagus.                           - Z-line regular, 45 cm from the incisors.                           - Acute gastritis.                           - Mucosal changes in the duodenum.                           - No specimens collected. Recommendation:           -  Observe patient's clinical course. Procedure Code(s):        --- Professional ---                           [phone], Esophagogastroduodenoscopy, flexible,                            transoral; diagnostic, including collection of                            specimen(s) by brushing or washing, when performed                            (separate procedure) Diagnosis Code(s):        --- Professional ---                           D50.9, Iron deficiency anemia, unspecified                           K92.0, Hematemesis                           K29.00, Acute gastritis without bleeding                           K31.89, Other diseases of stomach and duodenum CPT copyright  2019 [employer]. All rights reserved. The codes documented in this report are preliminary and upon coder review may  be revised to meet current compliance requirements. Lear Ng, MD 04/29/2020 4:25:28 PM This report has been signed electronically. Number of Addenda: 0

## 2020-04-29 NOTE — Progress Notes (Signed)
PROGRESS NOTE    [name]  SJG:[id number] DOB: 1961-10-02 DOA: 04/27/2020 PCP: [name], FNP  Brief Narrative: 59 year old male with past medical history of coronary artery disease (S/P DES to LAD 05/2016), diabetes mellitus type 2 (insulin dependent, last Hgb A1C 11.6% 65/4650), diastolic congestive heart failure (03/2019 - EF 60-65% with impaired relaxation), depression, hypertension, morbid obesity who presented to [facility] emergency department with complaints of bright red blood per rectum as well as hematemesis.  Patient explains that approximately 2 weeks ago he was at a restaurant eating ribs and he suddenly felt some epigastric pain followed by intense nausea.  Patient vomited twice that evening shortly thereafter.  Patient states that his vomitus had the appearance of blood   Assessment & Plan:  Hematemesis and bright red blood per rectum Iron deficiency anemia -Aspirin discontinued -Continue IV PPI -Gastroenterology consulting, plan for endoscopic evaluation today -Monitor hemoglobin  Uncontrolled type 2 diabetes mellitus -Hemoglobin A1c greater than 12 -N.p.o. today, continue current sliding scale insulin -Will need higher doses of insulin tomorrow  CAD -History of prior stent in 2017 -Aspirin on hold continue statin and beta-blocker  Chronic diastolic CHF -Holding diuretics at this time  Dyslipidemia -Continue Lipitor  Morbid obesity -Needs weight loss and lifestyle modification  DVT prophylaxis: SCDs Code Status: Full code Family Communication: No family at bedside Disposition Plan:  Status is: Inpatient  Remains inpatient appropriate because:Ongoing diagnostic testing needed not appropriate for outpatient work up   Dispo: The patient is from: Home              Anticipated d/c is to: Home              Anticipated d/c date is: 2 days              Patient currently is not medically stable to d/c.   Consultants:   [facility]   Procedures:   Antimicrobials:    Subjective: -Feels okay, no further bleeding today  Objective: Vitals:   04/28/20 1718 04/28/20 2138 04/29/20 0847 04/29/20 1515  BP: (!) 135/57 (!) 150/97 124/82 132/60  Pulse: (!) 57 (!) 58 62 68  Resp: 15 20 16 15   Temp: 97.6 F (36.4 C) 98.3 F (36.8 C) 98.4 F (36.9 C) 97.9 F (36.6 C)  TempSrc: Oral Oral Oral Temporal  SpO2: 95% 95% 92% 94%    Intake/Output Summary (Last 24 hours) at 04/29/2020 1548 Last data filed at 04/29/2020 1500 Gross per 24 hour  Intake 120 ml  Output --  Net 120 ml   There were no vitals filed for this visit.  Examination:  Gen: Awake, Alert, Oriented X 3, no distress HEENT: PERRLA, Neck supple, no JVD Lungs: Decreased breath sounds at the bases CVS: RRR,No Gallops,Rubs or new Murmurs Abd: soft, Non tender, mildly distended, BS present Extremities: No edema Skin: no new rashes Psychiatry: Judgement and insight appear normal. Mood & affect appropriate.     Data Reviewed:   CBC: Recent Labs  Lab 04/27/20 1509 04/27/20 2031 04/28/20 0240 04/29/20 1432  WBC 8.3  --  7.5 7.6  NEUTROABS  --   --  4.4  --   HGB 11.2* 12.9* 11.2* 10.9*  HCT 35.8* 38.0* 36.2* 35.1*  MCV 87.3  --  87.4 86.7  PLT 217  --  198 354   Basic Metabolic Panel: Recent Labs  Lab 04/27/20 1509 04/27/20 2031 04/28/20 0240  NA 135 139 137  K 3.9 3.7 3.6  CL 99  --  100  CO2 18*  --  24  GLUCOSE 311*  --  242*  BUN 12  --  12  CREATININE 0.90  --  0.73  CALCIUM 9.1  --  8.6*  MG  --   --  1.9  PHOS  --   --  3.7   GFR: CrCl cannot be calculated (Unknown ideal weight.). Liver Function Tests: Recent Labs  Lab 04/27/20 1509 04/28/20 0240  AST 22 24  ALT 18 16  ALKPHOS 54 53  BILITOT 0.6 0.7  PROT 6.5 5.9*  ALBUMIN 3.5 3.5   No results for input(s): LIPASE, AMYLASE in the last 168 hours. No results for input(s): AMMONIA in the last 168 hours. Coagulation Profile: Recent Labs  Lab  04/28/20 0240  INR 1.0   Cardiac Enzymes: No results for input(s): CKTOTAL, CKMB, CKMBINDEX, TROPONINI in the last 168 hours. BNP (last 3 results) No results for input(s): PROBNP in the last 8760 hours. HbA1C: Recent Labs    04/28/20 0240  HGBA1C 12.4*   CBG: Recent Labs  Lab 04/28/20 1937 04/28/20 2320 04/29/20 0313 04/29/20 0806 04/29/20 1238  GLUCAP 217* 256* 201* 143* 121*   Lipid Profile: No results for input(s): CHOL, HDL, LDLCALC, TRIG, CHOLHDL, LDLDIRECT in the last 72 hours. Thyroid Function Tests: No results for input(s): TSH, T4TOTAL, FREET4, T3FREE, THYROIDAB in the last 72 hours. Anemia Panel: Recent Labs    04/28/20 0240  VITAMINB12 305  FOLATE 10.3  TIBC 417  IRON 32*   Urine analysis:    Component Value Date/Time   COLORURINE YELLOW 07/31/2019 2035   APPEARANCEUR CLEAR 07/31/2019 2035   LABSPEC 1.017 07/31/2019 2035   PHURINE 5.0 07/31/2019 2035   GLUCOSEU >=500 (A) 07/31/2019 2035   HGBUR NEGATIVE 07/31/2019 2035   BILIRUBINUR NEGATIVE 07/31/2019 2035   KETONESUR NEGATIVE 07/31/2019 2035   PROTEINUR NEGATIVE 07/31/2019 2035   UROBILINOGEN 0.2 05/27/2014 2312   NITRITE NEGATIVE 07/31/2019 2035   LEUKOCYTESUR NEGATIVE 07/31/2019 2035   Sepsis Labs: @LABRCNTIP (procalcitonin:4,lacticidven:4)  ) Recent Results (from the past 240 hour(s))  SARS Coronavirus 2 by RT PCR (hospital order, performed in [place] hospital lab) Nasopharyngeal Nasopharyngeal Swab     Status: None   Collection Time: 04/27/20 11:00 PM   Specimen: Nasopharyngeal Swab  Result Value Ref Range Status   SARS Coronavirus 2 NEGATIVE NEGATIVE Final    Comment: (NOTE) SARS-CoV-2 target nucleic acids are NOT DETECTED.  The SARS-CoV-2 RNA is generally detectable in upper and lower respiratory specimens during the acute phase of infection. The lowest concentration of SARS-CoV-2 viral copies this assay can detect is 250 copies / mL. A negative result does not preclude  SARS-CoV-2 infection and should not be used as the sole basis for treatment or other patient management decisions.  A negative result may occur with improper specimen collection / handling, submission of specimen other than nasopharyngeal swab, presence of viral mutation(s) within the areas targeted by this assay, and inadequate number of viral copies (<250 copies / mL). A negative result must be combined with clinical observations, patient history, and epidemiological information.  Fact Sheet for Patients:   StrictlyIdeas.no  Fact Sheet for Healthcare Providers: BankingDealers.co.za  This test is not yet approved or  cleared by the [place] FDA and has been authorized for detection and/or diagnosis of SARS-CoV-2 by FDA under an Emergency Use Authorization (EUA).  This EUA will remain in effect (meaning this test can be used) for the duration of  the COVID-19 declaration under Section 564(b)(1) of the Act, 21 U.S.C. section 360bbb-3(b)(1), unless the authorization is terminated or revoked sooner.  Performed at [facility], [place] [address], [place], [place] [address]          Radiology Studies: CT ABDOMEN PELVIS W CONTRAST  Result Date: 04/27/2020 CLINICAL DATA:  59 year old male with abdominal pain. Concern for acute diverticulitis. EXAM: CT ABDOMEN AND PELVIS WITH CONTRAST TECHNIQUE: Multidetector CT imaging of the abdomen and pelvis was performed using the standard protocol following bolus administration of intravenous contrast. CONTRAST:  128mL OMNIPAQUE IOHEXOL 300 MG/ML  SOLN COMPARISON:  CT abdomen pelvis dated 07/31/2019. FINDINGS: Lower chest: The visualized lung bases are clear. No intra-abdominal free air or free fluid. Hepatobiliary: There is diffuse fatty infiltration of the liver. The liver is mildly enlarged. There is minimal apparent irregularity of the liver contour which may represent early changes of  cirrhosis. Correlation with clinical exam and LFTs recommended. No intrahepatic biliary ductal dilatation. The gallbladder is unremarkable. Pancreas: Unremarkable. No pancreatic ductal dilatation or surrounding inflammatory changes. Spleen: Similar areas of hypoenhancement in the spleen as seen on the prior CT and CT dating back to 2017. This is not characterized on this CT but likely a benign or indolent process, possibly hemangiomas. This can be better evaluated with ultrasound on a nonemergent basis if clinically indicated. Adrenals/Urinary Tract: The adrenal glands are unremarkable. There is no hydronephrosis on either side. There is symmetric enhancement and excretion of contrast by both kidneys. The visualized ureters and urinary bladder appear unremarkable. Stomach/Bowel: Small scattered colonic diverticula without active inflammatory changes. There is no bowel obstruction or active inflammation. The appendix is normal. Vascular/Lymphatic: Mild aortoiliac atherosclerotic disease. The IVC is unremarkable. No portal venous gas. There is no adenopathy. Reproductive: The prostate and seminal vesicles are grossly unremarkable. Other: None Musculoskeletal: Degenerative changes of the spine. No acute osseous pathology. IMPRESSION: 1. No bowel obstruction. Normal appendix. 2. Colonic diverticulosis. 3. Fatty liver with possible steatohepatitis and early changes of cirrhosis. Correlation with clinical exam and LFTs recommended. 4. Aortic Atherosclerosis (ICD10-I70.0). Electronically Signed   By: Anner Crete M.D.   On: 04/27/2020 21:25        Scheduled Meds: . [MAR Hold] allopurinol  100 mg Oral Daily  . [MAR Hold] atorvastatin  40 mg Oral Daily  . [MAR Hold] carvedilol  25 mg Oral BID WC  . [MAR Hold] DULoxetine  60 mg Oral Daily  . [MAR Hold] fenofibrate  160 mg Oral Daily  . [MAR Hold] ferrous sulfate  325 mg Oral BID WC  . [MAR Hold] folic acid  1 mg Oral Daily  . [MAR Hold] furosemide  40 mg  Oral TID WC  . [MAR Hold] insulin aspart  0-20 Units Subcutaneous Q4H  . [MAR Hold] insulin glargine  140 Units Subcutaneous Q2200  . [MAR Hold] isosorbide mononitrate  30 mg Oral Daily  . [MAR Hold] lisinopril  5 mg Oral Daily  . [MAR Hold] oxyCODONE  40 mg Oral BID  . [MAR Hold] pantoprazole (PROTONIX) IV  40 mg Intravenous Q12H  . [MAR Hold] pregabalin  300 mg Oral BID  . [MAR Hold] sertraline  50 mg Oral Daily  . [MAR Hold] traZODone  50 mg Oral QHS   Continuous Infusions: . sodium chloride 20 mL/hr at 04/29/20 0848  . sodium chloride Stopped (04/28/20 1118)  . lactated ringers       LOS: 1 day    Time spent: 51min  Domenic Polite, MD [facility] 04/29/2020, 3:48 PM

## 2020-04-29 NOTE — Anesthesia Preprocedure Evaluation (Signed)
Anesthesia Evaluation  Patient identified by MRN, date of birth, ID band Patient awake    Reviewed: Allergy & Precautions, H&P , NPO status , Patient's Chart, lab work & pertinent test results  Airway Mallampati: II   Neck ROM: full    Dental   Pulmonary sleep apnea ,    breath sounds clear to auscultation       Cardiovascular hypertension, + CAD, + Cardiac Stents and +CHF   Rhythm:regular Rate:Normal     Neuro/Psych  Headaches, PSYCHIATRIC DISORDERS Depression TIACVA    GI/Hepatic PUD, GERD  ,  Endo/Other  diabetes, Type 2  Renal/GU      Musculoskeletal  (+) Arthritis ,   Abdominal   Peds  Hematology  (+) Blood dyscrasia, anemia ,   Anesthesia Other Findings   Reproductive/Obstetrics                             Anesthesia Physical Anesthesia Plan  ASA: III  Anesthesia Plan: MAC   Post-op Pain Management:    Induction: Intravenous  PONV Risk Score and Plan: 1 and Propofol infusion and Treatment may vary due to age or medical condition  Airway Management Planned: Nasal Cannula  Additional Equipment:   Intra-op Plan:   Post-operative Plan:   Informed Consent: I have reviewed the patients History and Physical, chart, labs and discussed the procedure including the risks, benefits and alternatives for the proposed anesthesia with the patient or authorized representative who has indicated his/her understanding and acceptance.       Plan Discussed with: CRNA, Anesthesiologist and Surgeon  Anesthesia Plan Comments:         Anesthesia Quick Evaluation

## 2020-04-29 NOTE — Anesthesia Postprocedure Evaluation (Signed)
Anesthesia Post Note  Patient: DEAVEON SCHOEN  Procedure(s) Performed: ESOPHAGOGASTRODUODENOSCOPY (EGD) (N/A ) COLONOSCOPY WITH PROPOFOL (N/A ) POLYPECTOMY     Patient location during evaluation: Endoscopy Anesthesia Type: MAC Level of consciousness: awake and alert Pain management: pain level controlled Vital Signs Assessment: post-procedure vital signs reviewed and stable Respiratory status: spontaneous breathing, nonlabored ventilation and respiratory function stable Cardiovascular status: stable and blood pressure returned to baseline Postop Assessment: no apparent nausea or vomiting Anesthetic complications: no   No complications documented.  Last Vitals:  Vitals:   04/29/20 1635 04/29/20 1707  BP: (!) 140/44 (!) 127/95  Pulse: 73 60  Resp: 18 20  Temp:  36.6 C  SpO2: 93% 96%    Last Pain:  Vitals:   04/29/20 1707  TempSrc: Oral  PainSc:                  Naureen Benton,W. EDMOND

## 2020-04-29 NOTE — Op Note (Signed)
[facility] Patient Name: [name] Procedure Date : 04/29/2020 MRN: [id number] Attending MD: Lear Ng , MD Date of Birth: October 04, 1961 CSN: [id number] Age: 59 Admit Type: Inpatient Procedure:                Colonoscopy Indications:              Last colonoscopy: 2017, Hematochezia, Personal                            history of colonic polyps Providers:                Lear Ng, MD, Glori Bickers, RN, Elspeth Cho Tech., Technician Referring MD:             hospital team Medicines:                Propofol per Anesthesia, Monitored Anesthesia Care Complications:            No immediate complications. Estimated Blood Loss:     Estimated blood loss: none. Procedure:                Pre-Anesthesia Assessment:                           - Prior to the procedure, a History and Physical                            was performed, and patient medications and                            allergies were reviewed. The patient's tolerance of                            previous anesthesia was also reviewed. The risks                            and benefits of the procedure and the sedation                            options and risks were discussed with the patient.                            All questions were answered, and informed consent                            was obtained. Prior Anticoagulants: The patient has                            taken no previous anticoagulant or antiplatelet                            agents. ASA Grade Assessment: III - A patient with  severe systemic disease. After reviewing the risks                            and benefits, the patient was deemed in                            satisfactory condition to undergo the procedure.                           - Prior to the procedure, a History and Physical                            was performed, and patient medications and                             allergies were reviewed. The patient's tolerance of                            previous anesthesia was also reviewed. The risks                            and benefits of the procedure and the sedation                            options and risks were discussed with the patient.                            All questions were answered, and informed consent                            was obtained. Prior Anticoagulants: The patient has                            taken no previous anticoagulant or antiplatelet                            agents. ASA Grade Assessment: III - A patient with                            severe systemic disease. After reviewing the risks                            and benefits, the patient was deemed in                            satisfactory condition to undergo the procedure.                           After obtaining informed consent, the colonoscope                            was passed under direct vision. Throughout the  procedure, the patient's blood pressure, pulse, and                            oxygen saturations were monitored continuously. The                            [id number]-[id number] ([id number] Olympus pediatric colonscope                            was introduced through the anus and advanced to the                            the cecum, identified by appendiceal orifice and                            ileocecal valve. The colonoscopy was performed with                            difficulty due to inadequate bowel prep and a                            tortuous colon. Successful completion of the                            procedure was aided by straightening and shortening                            the scope to obtain bowel loop reduction and                            lavage. The patient tolerated the procedure well.                            The quality of the bowel preparation was                             inadequate. The ileocecal valve, appendiceal                            orifice, and rectum were photographed. Scope In: 3:44:51 PM Scope Out: 4:06:37 PM Scope Withdrawal Time: 0 hours 16 minutes 29 seconds  Total Procedure Duration: 0 hours 21 minutes 46 seconds  Findings:      The perianal and digital rectal examinations were normal.      A 10 mm polyp was found in the proximal ascending colon. The polyp was       sessile. The polyp was removed with a hot snare. Resection and retrieval       were complete. Estimated blood loss: none.      Scattered small and large-mouthed diverticula were found in the sigmoid       colon.      Internal hemorrhoids were found during retroflexion. The hemorrhoids       were small and Grade I (internal hemorrhoids that do not prolapse). Impression:               -  Preparation of the colon was inadequate.                           - One 10 mm polyp in the proximal ascending colon,                            removed with a hot snare. Resected and retrieved.                           - Diverticulosis in the sigmoid colon.                           - Internal hemorrhoids. Recommendation:           - Advance diet as tolerated and soft diet.                           - Await pathology results.                           - Repeat colonoscopy for surveillance based on                            pathology results. Procedure Code(s):        --- Professional ---                           [phone], Colonoscopy, flexible; with removal of                            tumor(s), polyp(s), or other lesion(s) by snare                            technique Diagnosis Code(s):        --- Professional ---                           K92.1, Melena (includes Hematochezia)                           K63.5, Polyp of colon                           K64.0, First degree hemorrhoids                           Z86.010, Personal history of colonic polyps                           K57.30,  Diverticulosis of large intestine without                            perforation or abscess without bleeding CPT copyright 2019 American Medical Association. All rights reserved. The codes documented in this report are preliminary and upon coder review may  be revised to meet current compliance requirements. Lear Ng, MD 04/29/2020 4:30:10 PM This report has been signed electronically. Number of Addenda:  0 

## 2020-04-29 NOTE — Brief Op Note (Signed)
See endopro note for details of EGD/colon. EGD minimal gastritis. Colonoscopy medium-sized colon polyp removed and sigmoid diverticulosis and internal hemorrhoids noted. Soft diet. No further GI workup planned. Will sign off. Call if questions. F/U with me in 4-6 weeks (patient requested on admit to transfer from [facility] to me).

## 2020-04-29 NOTE — Transfer of Care (Signed)
Immediate Anesthesia Transfer of Care Note  Patient: [name]  Procedure(s) Performed: ESOPHAGOGASTRODUODENOSCOPY (EGD) (N/A ) COLONOSCOPY WITH PROPOFOL (N/A ) POLYPECTOMY  Patient Location: Endoscopy Unit  Anesthesia Type:MAC  Level of Consciousness: drowsy and patient cooperative  Airway & Oxygen Therapy: Patient Spontanous Breathing and Patient connected to nasal cannula oxygen  Post-op Assessment: Report given to RN, Post -op Vital signs reviewed and stable and Patient moving all extremities X 4  Post vital signs: Reviewed and stable  Last Vitals:  Vitals Value Taken Time  BP 76/31 04/29/20 1614  Temp    Pulse 65 04/29/20 1615  Resp 19 04/29/20 1615  SpO2 99 % 04/29/20 1615  Vitals shown include unvalidated device data.  Last Pain:  Vitals:   04/29/20 1515  TempSrc: Temporal  PainSc: 0-No pain      Patients Stated Pain Goal: 0 (02/54/86 2824)  Complications: No complications documented.

## 2020-04-29 NOTE — Progress Notes (Signed)
Patient finished prep for colonoscopy.

## 2020-04-29 NOTE — Interval H&P Note (Signed)
History and Physical Interval Note:  04/29/2020 3:07 PM  [name]  has presented today for surgery, with the diagnosis of hematemesis, rectal bleeding.  The various methods of treatment have been discussed with the patient and family. After consideration of risks, benefits and other options for treatment, the patient has consented to  Procedure(s): ESOPHAGOGASTRODUODENOSCOPY (EGD) (N/A) COLONOSCOPY WITH PROPOFOL (N/A) as a surgical intervention.  The patient's history has been reviewed, patient examined, no change in status, stable for surgery.  I have reviewed the patient's chart and labs.  Questions were answered to the patient's satisfaction.     Lear Ng

## 2020-04-30 LAB — COMPREHENSIVE METABOLIC PANEL
ALT: 17 U/L (ref 0–44)
AST: 20 U/L (ref 15–41)
Albumin: 3.3 g/dL — ABNORMAL LOW (ref 3.5–5.0)
Alkaline Phosphatase: 52 U/L (ref 38–126)
Anion gap: 10 (ref 5–15)
BUN: 7 mg/dL (ref 6–20)
CO2: 30 mmol/L (ref 22–32)
Calcium: 9.2 mg/dL (ref 8.9–10.3)
Chloride: 98 mmol/L (ref 98–111)
Creatinine, Ser: 0.87 mg/dL (ref 0.61–1.24)
GFR calc Af Amer: >60 mL/min (ref >60)
GFR calc non Af Amer: >60 mL/min (ref >60)
Glucose, Bld: 206 mg/dL — ABNORMAL HIGH (ref 70–99)
Potassium: 3.9 mmol/L (ref 3.5–5.1)
Sodium: 138 mmol/L (ref 135–145)
Total Bilirubin: 0.7 mg/dL (ref 0.3–1.2)
Total Protein: 6.7 g/dL (ref 6.5–8.1)

## 2020-04-30 LAB — CBC
HCT: 36.6 % — ABNORMAL LOW (ref 39.0–52.0)
Hemoglobin: 11.2 g/dL — ABNORMAL LOW (ref 13.0–17.0)
MCH: 27.1 pg (ref 26.0–34.0)
MCHC: 30.6 g/dL (ref 30.0–36.0)
MCV: 88.6 fL (ref 80.0–100.0)
Platelets: 191 10*3/uL (ref 150–400)
RBC: 4.13 MIL/uL — ABNORMAL LOW (ref 4.22–5.81)
RDW: 15.4 % (ref 11.5–15.5)
WBC: 6.9 10*3/uL (ref 4.0–10.5)
nRBC: 0 % (ref 0.0–0.2)

## 2020-04-30 LAB — GLUCOSE, CAPILLARY
Glucose-Capillary: 186 mg/dL — ABNORMAL HIGH (ref 70–99)
Glucose-Capillary: 198 mg/dL — ABNORMAL HIGH (ref 70–99)

## 2020-04-30 MED ORDER — FOLIC ACID 1 MG PO TABS: 1.0000 mg | ORAL_TABLET | Freq: Every day | ORAL | 0 refills | Status: DC

## 2020-04-30 MED ORDER — PANTOPRAZOLE SODIUM 40 MG PO TBEC: 40.0000 mg | DELAYED_RELEASE_TABLET | Freq: Two times a day (BID) | ORAL | Status: DC

## 2020-04-30 MED ORDER — FERROUS SULFATE 325 (65 FE) MG PO TABS: 325.0000 mg | ORAL_TABLET | Freq: Two times a day (BID) | ORAL | 0 refills | Status: DC

## 2020-04-30 NOTE — Plan of Care (Signed)

## 2020-05-01 LAB — SURGICAL PATHOLOGY

## 2020-05-03 ENCOUNTER — Encounter (HOSPITAL_COMMUNITY): Payer: Self-pay | Admitting: Gastroenterology

## 2020-05-14 NOTE — Discharge Summary (Signed)
Physician Discharge Summary  [name] JJO:[id number] DOB: 07-02-1961 DOA: 04/27/2020  PCP: [name], FNP  Admit date: 04/27/2020 Discharge date: 04/30/2020  Time spent: 35 minutes  Recommendations for Outpatient Follow-up:  PCP in 1 week, please check CBC in 7-10days Close FU with Endocrinology for uncontrolled DM  Discharge Diagnoses:  Principal Problem:   Acute GI bleeding   Acute Gastritis   Diverticulosis   Internal Hemorroids   Polyps   Coronary artery disease involving native coronary artery of native heart without angina pectoris   GERD (gastroesophageal reflux disease)   Uncontrolled type 2 diabetes mellitus with hyperglycemia (HCC)   Chronic diastolic CHF (congestive heart failure) ([place])   Hypertensive urgency   Mixed diabetic hyperlipidemia associated with type 2 diabetes mellitus ([place])   Upper GI bleed   Discharge Condition: stable  Diet recommendation: DM/low sodium  There were no vitals filed for this visit.  History of present illness:  59 year old male with past medical history of coronary artery disease(S/P DES to LAD 05/2016),diabetes mellitus type 2(insulin dependent, last Hgb A1C 16.0% 09/2019),diastolic congestive heart failure(03/2019 - EF 60-65% with impaired relaxation),depression, hypertension, morbid obesity who presented to [facility] emergency department with complaints of bright red blood per rectum as well as hematemesis. Patient explains that approximately 2 weeks ago he was at a restaurant eating ribs and he suddenly felt some epigastric pain followed by intense nausea. Patient vomited twice that evening shortly thereafter. Patient states that his vomitus had the appearance of blood  Hospital Course:   Hematemesis and bright red blood per rectum Iron deficiency anemia -Aspirin held on admission, treated with IV PPI, [facility] consulted, underwent EGD which noted acute gastritis and Colonsocopy noted diverticulosis, polyp  removed and int.hemorroids noted -Hb stable and no further bleeding, discharged home on PPI, advised to have CBC checked in 1 week and FU Polyp biopsy results  Uncontrolled type 2 diabetes mellitus -Hemoglobin A1c greater than 12 -Insulin dose increased, recommended close FU with Endocrinology  CAD -History of prior stent in 2017 -Aspirin held on admission and resumed at DC - continue statin and beta-blocker  Chronic diastolic CHF -Holding diuretics at this time  Dyslipidemia -Continue Lipitor  Morbid obesity -Needs weight loss and lifestyle modification   Procedures:  EGD  Impression:               - Normal esophagus.                           - Z-line regular, 45 cm from the incisors.                           - Acute gastritis.                           - Mucosal changes in the duodenum.                           - No specimens collected.  Colonsocopy Impression:               - Preparation of the colon was inadequate.                           - One 10 mm polyp in the proximal ascending colon,  removed with a hot snare. Resected and retrieved.                           - Diverticulosis in the sigmoid colon.                           - Internal hemorrhoids.  Consultations: [facility] Discharge Exam: Vitals:   04/30/20 0600 04/30/20 0749  BP:  (!) 161/83  Pulse:  67  Resp: 15 16  Temp:  98 F (36.7 C)  SpO2:  96%    General:AAOx3 Cardiovascular: S1S2/RRR Respiratory: CTAB  Discharge Instructions   Discharge Instructions    Diet - low sodium heart healthy   Complete by: As directed    Diet Carb Modified   Complete by: As directed    Increase activity slowly   Complete by: As directed      Allergies as of 04/30/2020      Reactions   Coconut Flavor [flavoring Agent] Hives   Coconut Oil Hives   Ibuprofen Other (See Comments)   Made gastric ulcers worse   Aleve [naproxen] Other (See Comments)   DUE TO KIDNEYS   Nsaids  Other (See Comments)   Stomach ulcers   Vascepa [icosapent Ethyl] Other (See Comments)   headaches, chest pain, similar to sx of a stroke, hypotension       Medication List    TAKE these medications   Accu-Chek FastClix Lancets Misc 1 Device by Other route 2 (two) times daily.   Albuterol Sulfate 108 (90 Base) MCG/ACT Aepb Commonly known as: ProAir RespiClick Inhale 2 puffs into the lungs every 6 (six) hours as needed. What changed: reasons to take this   allopurinol 100 MG tablet Commonly known as: ZYLOPRIM Take 100 mg by mouth daily.   aspirin 81 MG chewable tablet Chew 1 tablet (81 mg total) by mouth daily.   atorvastatin 40 MG tablet Commonly known as: LIPITOR Take 1 tablet (40 mg total) by mouth daily.   carvedilol 25 MG tablet Commonly known as: COREG Take 1 tablet (25 mg total) by mouth 2 (two) times daily with a meal.   [employer] VITAMIN B12 1000 MCG tablet Generic drug: cyanocobalamin Take 1,000 mcg by mouth daily.   diclofenac 1.3 % Ptch Commonly known as: FLECTOR Place 1 patch onto the skin daily as needed for pain.   DULoxetine 60 MG capsule Commonly known as: CYMBALTA Take 1 capsule (60 mg total) by mouth daily. Please request future refills from PCP.   famotidine 20 MG tablet Commonly known as: PEPCID Take 20 mg by mouth daily.   fenofibrate 145 MG tablet Commonly known as: TRICOR Take 1 tablet (145 mg total) by mouth daily.   ferrous sulfate 325 (65 FE) MG tablet Take 1 tablet (325 mg total) by mouth 2 (two) times daily with a meal.   folic acid 1 MG tablet Commonly known as: FOLVITE Take 1 tablet (1 mg total) by mouth daily.   furosemide 40 MG tablet Commonly known as: Lasix Take 1 tablet (40 mg total) by mouth 3 (three) times daily.   glipiZIDE 10 MG tablet Commonly known as: GLUCOTROL Take 10 mg by mouth 2 (two) times daily.   insulin degludec 200 UNIT/ML FlexTouch Pen Commonly known as: TRESIBA Inject 140 Units as directed at  bedtime.   Insulin Pen Needle 31G X 5 MM Misc Use 1 needle daily to inject insulin as  prescribed What changed:   how much to take  how to take this  when to take this   isosorbide mononitrate 30 MG 24 hr tablet Commonly known as: IMDUR Take 1 tablet (30 mg total) by mouth daily.   lisinopril 5 MG tablet Commonly known as: ZESTRIL Take 5 mg by mouth daily.   nitroGLYCERIN 0.4 MG SL tablet Commonly known as: NITROSTAT Place 1 tablet (0.4 mg total) under the tongue every 5 (five) minutes as needed for chest pain.   NovoLOG FlexPen 100 UNIT/ML FlexPen Generic drug: insulin aspart Inject 36-40 Units into the skin 3 (three) times daily with meals. Per sliding scale   ondansetron 4 MG tablet Commonly known as: ZOFRAN Take 4 mg by mouth 2 (two) times daily as needed for nausea/vomiting.   oxyCODONE 15 MG immediate release tablet Commonly known as: ROXICODONE Take 15 mg 3 (three) times daily as needed by mouth for pain.   pantoprazole 40 MG tablet Commonly known as: PROTONIX Take 1 tablet (40 mg total) by mouth 2 (two) times daily. What changed: when to take this   potassium chloride 10 MEQ CR capsule Commonly known as: MICRO-K Take 10 mEq by mouth in the morning and at bedtime.   pregabalin 300 MG capsule Commonly known as: LYRICA Take 300 mg by mouth 2 (two) times daily.   sertraline 50 MG tablet Commonly known as: ZOLOFT Take 50 mg by mouth daily.   tiZANidine 4 MG tablet Commonly known as: ZANAFLEX Take 4 mg by mouth 3 (three) times daily as needed for muscle spasms.   traZODone 50 MG tablet Commonly known as: DESYREL Take 1 tablet (50 mg total) by mouth at bedtime.   Vitamin D (Ergocalciferol) 1.25 MG (50000 UNIT) Caps capsule Commonly known as: DRISDOL Take 50,000 Units by mouth once a week. mondays   Xtampza ER 36 MG C12a Generic drug: oxyCODONE ER Take 36 mg by mouth 2 (two) times daily.   Xtampza ER 27 MG C12a Generic drug: oxyCODONE ER Take 27  mg by mouth 2 (two) times daily.      Allergies  Allergen Reactions  . Coconut Flavor [Flavoring Agent] Hives  . Coconut Oil Hives  . Ibuprofen Other (See Comments)    Made gastric ulcers worse  . Aleve [Naproxen] Other (See Comments)    DUE TO KIDNEYS  . Nsaids Other (See Comments)    Stomach ulcers   . Vascepa [Icosapent Ethyl] Other (See Comments)    headaches, chest pain, similar to sx of a stroke, hypotension     Follow-up Information    [name], FNP Follow up.   Specialty: Family Medicine Contact information: [place] [address] [phone]        Wilford Corner, MD. Schedule an appointment as soon as possible for a visit in 4 week(s).   Specialty: Gastroenterology Contact information: [address] [place] [address] [phone]                The results of significant diagnostics from this hospitalization (including imaging, microbiology, ancillary and laboratory) are listed below for reference.    Significant Diagnostic Studies: CT ABDOMEN PELVIS W CONTRAST  Result Date: 04/27/2020 CLINICAL DATA:  59 year old male with abdominal pain. Concern for acute diverticulitis. EXAM: CT ABDOMEN AND PELVIS WITH CONTRAST TECHNIQUE: Multidetector CT imaging of the abdomen and pelvis was performed using the standard protocol following bolus administration of intravenous contrast. CONTRAST:  123mL OMNIPAQUE IOHEXOL 300  MG/ML  SOLN COMPARISON:  CT abdomen pelvis dated 07/31/2019. FINDINGS: Lower chest: The visualized lung bases are clear. No intra-abdominal free air or free fluid. Hepatobiliary: There is diffuse fatty infiltration of the liver. The liver is mildly enlarged. There is minimal apparent irregularity of the liver contour which may represent early changes of cirrhosis. Correlation with clinical exam and LFTs recommended. No intrahepatic biliary ductal dilatation. The gallbladder is unremarkable. Pancreas:  Unremarkable. No pancreatic ductal dilatation or surrounding inflammatory changes. Spleen: Similar areas of hypoenhancement in the spleen as seen on the prior CT and CT dating back to 2017. This is not characterized on this CT but likely a benign or indolent process, possibly hemangiomas. This can be better evaluated with ultrasound on a nonemergent basis if clinically indicated. Adrenals/Urinary Tract: The adrenal glands are unremarkable. There is no hydronephrosis on either side. There is symmetric enhancement and excretion of contrast by both kidneys. The visualized ureters and urinary bladder appear unremarkable. Stomach/Bowel: Small scattered colonic diverticula without active inflammatory changes. There is no bowel obstruction or active inflammation. The appendix is normal. Vascular/Lymphatic: Mild aortoiliac atherosclerotic disease. The IVC is unremarkable. No portal venous gas. There is no adenopathy. Reproductive: The prostate and seminal vesicles are grossly unremarkable. Other: None Musculoskeletal: Degenerative changes of the spine. No acute osseous pathology. IMPRESSION: 1. No bowel obstruction. Normal appendix. 2. Colonic diverticulosis. 3. Fatty liver with possible steatohepatitis and early changes of cirrhosis. Correlation with clinical exam and LFTs recommended. 4. Aortic Atherosclerosis (ICD10-I70.0). Electronically Signed   By: Anner Crete M.D.   On: 04/27/2020 21:25    Microbiology: No results found for this or any previous visit (from the past 240 hour(s)).   Labs: Basic Metabolic Panel: No results for input(s): NA, K, CL, CO2, GLUCOSE, BUN, CREATININE, CALCIUM, MG, PHOS in the last 168 hours. Liver Function Tests: No results for input(s): AST, ALT, ALKPHOS, BILITOT, PROT, ALBUMIN in the last 168 hours. No results for input(s): LIPASE, AMYLASE in the last 168 hours. No results for input(s): AMMONIA in the last 168 hours. CBC: No results for input(s): WBC, NEUTROABS, HGB, HCT,  MCV, PLT in the last 168 hours. Cardiac Enzymes: No results for input(s): CKTOTAL, CKMB, CKMBINDEX, TROPONINI in the last 168 hours. BNP: BNP (last 3 results) Recent Labs    06/05/19 1453 07/31/19 1746 08/22/19 2345  BNP 24.1 13.5 58.1    ProBNP (last 3 results) No results for input(s): PROBNP in the last 8760 hours.  CBG: No results for input(s): GLUCAP in the last 168 hours.     Signed:  Domenic Polite MD.  [facility] 05/14/2020, 7:46 AM

## 2020-06-08 ENCOUNTER — Other Ambulatory Visit: Payer: Self-pay

## 2020-06-08 ENCOUNTER — Telehealth: Payer: Self-pay

## 2020-06-08 ENCOUNTER — Ambulatory Visit: Payer: Medicaid Other | Attending: Family Medicine

## 2020-06-08 DIAGNOSIS — M5441 Lumbago with sciatica, right side: Secondary | ICD-10-CM | POA: Diagnosis present

## 2020-06-08 DIAGNOSIS — G8929 Other chronic pain: Secondary | ICD-10-CM | POA: Insufficient documentation

## 2020-06-08 DIAGNOSIS — R293 Abnormal posture: Secondary | ICD-10-CM | POA: Insufficient documentation

## 2020-06-08 DIAGNOSIS — R2689 Other abnormalities of gait and mobility: Secondary | ICD-10-CM | POA: Diagnosis present

## 2020-06-08 DIAGNOSIS — R2681 Unsteadiness on feet: Secondary | ICD-10-CM | POA: Diagnosis present

## 2020-06-08 DIAGNOSIS — M6281 Muscle weakness (generalized): Secondary | ICD-10-CM | POA: Diagnosis present

## 2020-06-08 NOTE — Telephone Encounter (Signed)
The following was faxed over as provider not in epic:   [name], [place], Jaris Kohles [birth year]b Jul 18, 2061 was evaluated by PT on 06/08/20.  The patient would benefit from OT evaluation to address left hand pain.   If you agree, please place an order in [facility] in [facility] or fax the order to [phone]. Thank you, Cherly Anderson, PT, DPT, [place] [address] [place], [place]  [address] Phone:  [phone] Fax:  [phone]

## 2020-06-08 NOTE — Therapy (Signed)
[facility] [address] [place], [place], [address] Phone: [phone]   Fax:  [phone]  Physical Therapy Evaluation  Patient Details  Name: [name] MRN: [id number] Date of Birth: 1961/05/06 Referring Provider (PT): [name], FNP   Encounter Date: 06/08/2020   PT End of Session - 06/08/20 0843    Visit Number 1    Number of Visits 13    Authorization Type medicaid healthy blue- 27 visits/year with Josem Kaufmann    PT Start Time 0805    PT Stop Time 0845    PT Time Calculation (min) 40 min    Activity Tolerance Patient tolerated treatment well    Behavior During Therapy [facility] for tasks assessed/performed           Past Medical History:  Diagnosis Date  . Anemia   . Arthritis   . Back pain   . CAD (coronary artery disease)    a. s/p DES to LAD in 05/2016  . Cervical radiculopathy   . Chronic diastolic CHF (congestive heart failure) ([place])   . Chronic pain   . Depression   . DVT (deep venous thrombosis) ([place])   . Hematemesis   . Hepatic steatosis   . Hyperlipidemia   . Hypertension   . IBS (irritable bowel syndrome)   . Morbid obesity ([place])   . OSA (obstructive sleep apnea)   . Pancreatitis   . PE (pulmonary thromboembolism) ([place])   . Peripheral neuropathy   . PUD (peptic ulcer disease)   . Renal disorder   . Stroke [facility])    a. ?details unclear - not seen on imaging when he was admitted in 05/2017 for TIA symptoms which were felt due to cervical radiculopathy.  . Thoracic aortic ectasia (HCC)    a. 4.3cm ectatic ascending thoracic aorta by CT 06/2017.   . Type 2 diabetes mellitus ([place])     Past Surgical History:  Procedure Laterality Date  . CARDIAC CATHETERIZATION N/A 05/31/2016   Procedure: Left Heart Cath and Coronary Angiography;  Surgeon: Peter M Martinique, MD;  Location: [place] CV LAB;  Service: Cardiovascular;  Laterality: N/A;  . CARDIAC CATHETERIZATION N/A 05/31/2016   Procedure:  Intravascular Pressure Wire/FFR Study;  Surgeon: Peter M Martinique, MD;  Location: [place] CV LAB;  Service: Cardiovascular;  Laterality: N/A;  . CARDIAC CATHETERIZATION N/A 05/31/2016   Procedure: Coronary Stent Intervention;  Surgeon: Peter M Martinique, MD;  Location: [place] CV LAB;  Service: Cardiovascular;  Laterality: N/A;  . COLONOSCOPY WITH PROPOFOL N/A 04/29/2020   Procedure: COLONOSCOPY WITH PROPOFOL;  Surgeon: Wilford Corner, MD;  Location: [name];  Service: Endoscopy;  Laterality: N/A;  . ESOPHAGOGASTRODUODENOSCOPY N/A 04/29/2020   Procedure: ESOPHAGOGASTRODUODENOSCOPY (EGD);  Surgeon: Wilford Corner, MD;  Location: [place];  Service: Endoscopy;  Laterality: N/A;  . LEFT HEART CATH AND CORONARY ANGIOGRAPHY N/A 12/08/2017   Procedure: LEFT HEART CATH AND CORONARY ANGIOGRAPHY;  Surgeon: Leonie Man, MD;  Location: [place] CV LAB;  Service: Cardiovascular;  Laterality: N/A;  . LEFT HEART CATHETERIZATION WITH CORONARY ANGIOGRAM N/A 02/03/2014   Procedure: LEFT HEART CATHETERIZATION WITH CORONARY ANGIOGRAM;  Surgeon: Pixie Casino, MD;  Location: [facility] CATH LAB;  Service: Cardiovascular;  Laterality: N/A;  . left leg stent     . POLYPECTOMY  04/29/2020   Procedure: POLYPECTOMY;  Surgeon: Wilford Corner, MD;  Location: [facility] ENDOSCOPY;  Service: Endoscopy;;    There were no vitals filed for this visit.  Subjective Assessment - 06/08/20 0809    Subjective Pt referred for LBP and left hand pain. Pt reports back pain has been for several years and left hand pain last 10 months. Pt reports that back pain is constant. Also makes sleeping difficult. Sleeps in recliner chair the last several years most of time. Pt denies any real change with positions. Pain is mostly on right low back and does refer down to right knee area. Describes as dull/aching pain. Pt has rollator that he uses for longer distances. Does not have today. Pt tries to go to gym. Used to do 10 min on treadmill  now only 3 minutes. Going 2x/week to gym. He also does 15 minutes on elliptical and bike. Pt reports that balance not as great from strokes and neuropathy. Pt has had injections and they do help with back pain. Last one was 3-4 months ago. Pt also going to try acupuncture tomorrow. Pt had carpal tunnel surgery in March of this year. Will request OT referral to address hand.    Pertinent History neuropathy, obesity, recent GI bleed with transfusion, DM 2, 5 CVAs, atypical chest pain, HTN,    How long can you walk comfortably? <1 mile    Diagnostic tests 11/2016 lumbar x-rays: disc space flattening with mild anteriorspurring at T11-T12 level. Stable mild anterior spurring at L1-L2level. Stable moderate anterior spurring at L3-L4 level. Mildanterior spurring upper endplate of L5. Stable mild disc spaceflattening at L5-S1 level. Mild facet degenerative changes at L4 andL5 level.    Patient Stated Goals Pt wants to be painfree    Currently in Pain? Yes    Pain Score 8    up to 10/10 on rainy, cold days at worst. After injection pain 3-4/10.   Pain Location Back    Pain Orientation Right;Lower    Pain Descriptors / Indicators Aching    Pain Type Chronic pain    Pain Radiating Towards right knee    Pain Onset More than a month ago    Pain Frequency Constant    Aggravating Factors  Prolonged sitting, walking a lot    Pain Relieving Factors medication allows him to sleep, stretching out, sitting after walking a long ways    Multiple Pain Sites Yes    Pain Score 9    Pain Location Hand    Pain Orientation Left    Pain Descriptors / Indicators Burning;Pins and needles    Pain Type Chronic pain    Pain Onset More than a month ago    Pain Frequency Intermittent              [facility] PT Assessment - 06/08/20 0820      Assessment   Medical Diagnosis LBP    Referring Provider (PT) [name], FNP    Onset Date/Surgical Date 05/18/20    Hand Dominance Right    Prior Therapy not for back       Balance Screen   Has the patient fallen in the past 6 months Yes    How many times? 2   bending over and came up quick and kept going   Has the patient had a decrease in activity level because of a fear of falling?  No    Is the patient reluctant to leave their home because of a fear of falling?  No      Home Environment   Living Environment Private residence    Living Arrangements Alone    Type of [place]  Home Access Elevator   3rd floor   Home Layout One level    [place] - 4 wheels;Cane - single point      Prior Function   Level of Independence Independent;Independent with household mobility without device;Independent with community mobility with device    Vocation On disability    Leisure gym      Observation/Other Assessments   Other Surveys  Oswestry Disability Index    Oswestry Disability Index  44% indicating severe self perceived disability due to back pain      Sensation   Light Touch Appears Intact    Additional Comments Pt did report felt a little tingling on bottom of right foot.      Posture/Postural Control   Posture Comments Pt has slight forward head posture and increased lumbar lordosis      ROM / Strength   AROM / PROM / Strength AROM;Strength      AROM   AROM Assessment Site Lumbar    Lumbar Flexion WNL   pulling on right   Lumbar Extension WFL but increased pain on right    Lumbar - Right Side Bend decreased by about 25% with increased pain on right    Lumbar - Left Side Bend WFL    Lumbar - Right Rotation decreased by about 25% with increased pain on right    Lumbar - Left Rotation [facility]      Strength   Overall Strength Comments Grossly 4+/5 in BLE with slight increased pain with resisted left hip flexion at 4/5.      Special Tests    Special Tests Lumbar;Sacrolliac Tests    Other special tests right single knee to chest increased pain in right hip, less with added A/P hip mob. Right hip distraction felt good.    Lumbar Tests  Straight Leg Raise      Straight Leg Raise   Findings Negative    Side  Left    Comment Right side negative but did have decreased hamstring length about 40 degrees with some discomfort but more pulling than pt's pain.      Bed Mobility   Bed Mobility Rolling Left;Supine to Sit;Sit to Supine    Rolling Left Independent    Supine to Sit Minimal Assistance - Patient > 75%    Sit to Supine Supervision/Verbal cueing      Transfers   Transfers Sit to Stand;Stand to Sit    Sit to Stand 5: Supervision    Five time sit to stand comments  19.18 sec from chair with hands. Back felt ok.    Stand to Sit 7: Independent      Ambulation/Gait   Ambulation/Gait Yes    Ambulation/Gait Assistance 5: Supervision    Ambulation/Gait Assistance Details trendelenberg right    Ambulation Distance (Feet) 50 Feet    Assistive device None    Gait Pattern Step-through pattern;Trendelenburg    Ambulation Surface Level;Indoor    Gait velocity 13.97 sec=0.35m/s                      Objective measurements completed on examination: See above findings.               PT Education - 06/08/20 1105    Education Details PT plan of care    Person(s) Educated Patient    Methods Explanation    Comprehension Verbalized understanding            PT Short Term Goals -  06/08/20 1131      PT SHORT TERM GOAL #1   Title Pt will be independent with initial HEP for strengthening and flexibility.    Baseline no current strengthening and flexiblity HEP. Doing cardio at gym.    Time 3    Period Weeks    Status New    Target Date 06/29/20      PT SHORT TERM GOAL #2   Title Pt will be instructed in proper body mechanics with activities to protect back around home and be able to return demonstrate.    Baseline has not been instructed in proper body mechanics    Time 3    Period Weeks    Status New    Target Date 06/29/20             PT Long Term Goals - 06/08/20 1134      PT LONG  TERM GOAL #1   Title Pt will be independent with progress HEP for strengthening, flexibility and aerobic exercise to continue with gym program.    Baseline currently only doing cardio at gym and limited by pain    Time 6    Period Weeks    Status New    Target Date 07/20/20      PT LONG TERM GOAL #2   Title Pt will decrease 5 x sit to stand from 19.18 sec to <16 sec for improved functional strength and balance.    Baseline 19.18 sec from chair with arms on 06/08/20    Time 6    Period Weeks    Status New    Target Date 07/20/20      PT LONG TERM GOAL #3   Title Pt will decrease Oswestry score from 44% to <35% for decreased self perceived disability due to back pain.    Baseline 44% on 06/08/20    Time 6    Period Weeks    Status New    Target Date 07/20/20      PT LONG TERM GOAL #4   Title Pt will ambulate >6 min with <6/10 back pain for improved function.    Baseline 8-10/10 back pain currently. Reports only able to walk 3 min on treaadmill    Time 6    Period Weeks    Status New    Target Date 07/20/20                  Plan - 06/08/20 1107    Clinical Impression Statement Pt presents with history of chronic back pain. Pain is on right low back and refers down to right knee. Pain ranges typically 8-10/10. Reports that after injections goes down to 3-4/10. Medications and stretching out help slightly. Pt's lumbar ROM for most part WFL with slight decrease with right SB and right rotation and reports increased pain with these as well as with extension. Pt has increased lumbar lordosis. Decreased left hamstring length. Decreased pain with right hip distraction. Pt is ambulating at gait speed of 0.97m/s which shows decreased safety for community ambulator. Pt has right trendelenberg that may be contributing to pain as well. Decreased functional strength and balance with 5 x sit to stand of 19.18 sec. Oswestry score of 44% indicating severe self perceived disability due to back  pain. Pt will benefit from skilled PT to address core and RLE strength, flexibility, pain and functional mobility deficits to improve mobility.    Personal Factors and Comorbidities Comorbidity 3+;Time since onset of injury/illness/exacerbation  Comorbidities neuropathy, obesity, recent GI bleed with transfusion, DM 2, 5 CVAs, atypical chest pain, HTN,    Examination-Activity Limitations Sleep;Stand;Locomotion Level;Transfers;Bed Mobility    Examination-Participation Restrictions Community Activity;Meal Prep;Yard Work    [employer] officer Evolving/Moderate complexity    Clinical Decision Making Moderate    Rehab Potential Good    PT Frequency 2x / week   plus eval   PT Duration 6 weeks    PT Treatment/Interventions ADLs/Self Care Home Management;Aquatic Therapy;Cryotherapy;Moist Heat;DME Instruction;Gait training;Stair training;Functional mobility training;Therapeutic activities;Therapeutic exercise;Patient/family education;Neuromuscular re-education;Balance training;Passive range of motion;Manual techniques;Spinal Manipulations;Joint Manipulations    PT Next Visit Plan Begin core stabilization and right hip strengthening, right hip distraction, right hamstring stretch if able to tolerate? aerobic activity,    Recommended Other Services will request OT to address hand    Consulted and Agree with Plan of Care Patient           Patient will benefit from skilled therapeutic intervention in order to improve the following deficits and impairments:  Abnormal gait, Decreased range of motion, Decreased mobility, Decreased strength, Impaired flexibility, Pain, Improper body mechanics, Decreased balance, Decreased endurance  Visit Diagnosis: Abnormal posture  Other abnormalities of gait and mobility  Muscle weakness (generalized)  Chronic right-sided low back pain with right-sided sciatica     Problem List Patient Active Problem List   Diagnosis Date Noted  . Upper GI  bleed 04/28/2020  . Hypertensive urgency 04/27/2020  . Acute GI bleeding 04/27/2020  . Mixed diabetic hyperlipidemia associated with type 2 diabetes mellitus ([place]) 04/27/2020  . Chronic diastolic CHF (congestive heart failure) ([place]) 08/23/2019  . ARF (acute renal failure) ([place]) 08/01/2019  . Hyperkalemia 08/01/2019  . Uncontrolled type 2 diabetes mellitus with hyperglycemia ([place]) 08/01/2019  . AKI (acute kidney injury) ([place])   . Hypotension 07/31/2019  . DKA (diabetic ketoacidoses) ([place]) 05/05/2019  . Blurred vision 05/05/2019  . Alteration consciousness 11/19/2018  . Neck pain 10/09/2018  . Left arm weakness 10/09/2018  . B12 deficiency 08/10/2017  . Persistent headaches 08/09/2017  . GERD (gastroesophageal reflux disease) 06/29/2017  . Left arm numbness   . Cerebral embolism with cerebral infarction 06/17/2017  . TIA (transient ischemic attack) 06/17/2017  . Chronic back pain 12/29/2016  . Depression 12/15/2016  . Vitamin D deficiency 12/09/2016  . Right hip pain 12/07/2016  . Hypokalemia 12/07/2016  . Type 2 diabetes mellitus with vascular disease ([place]) 05/31/2016  . Normocytic normochromic anemia 05/31/2016  . Chest pain 05/31/2016  . Coronary artery disease involving native coronary artery of native heart without angina pectoris 01/06/2016  . DVT (deep venous thrombosis) ([place]) 01/06/2016  . Lactic acidosis 05/28/2014  . Nonspecific chest pain 01/29/2014  . Uncontrolled secondary diabetes with peripheral neuropathy ([place]) 01/29/2014  . Obesity, Class III, BMI 40-49.9 (morbid obesity) ([place]) 01/29/2014  . Snoring 01/29/2014  . Dyslipidemia 01/29/2014  . HTN (hypertension) 01/29/2014  . Abnormal nuclear stress test 01/29/2014    Electa Sniff, PT, DPT, NCS 06/08/2020, 11:55 AM  [facility] [address] [place], [place], [address] Phone: [phone]   Fax:  [phone]  Name: AXELL TRIGUEROS MRN:  [id number] Date of Birth: Feb 07, 1961

## 2020-06-12 ENCOUNTER — Other Ambulatory Visit: Payer: Self-pay

## 2020-06-12 ENCOUNTER — Ambulatory Visit: Payer: Medicaid Other

## 2020-06-12 DIAGNOSIS — R293 Abnormal posture: Secondary | ICD-10-CM | POA: Diagnosis not present

## 2020-06-12 DIAGNOSIS — G8929 Other chronic pain: Secondary | ICD-10-CM

## 2020-06-12 DIAGNOSIS — R2689 Other abnormalities of gait and mobility: Secondary | ICD-10-CM

## 2020-06-12 DIAGNOSIS — M6281 Muscle weakness (generalized): Secondary | ICD-10-CM

## 2020-06-12 DIAGNOSIS — M5441 Lumbago with sciatica, right side: Secondary | ICD-10-CM

## 2020-06-12 NOTE — Patient Instructions (Signed)
Access Code: [email or web address] URL: [email or web address] Date: 06/12/2020 Prepared by: Baldomero Lamy  Exercises Seated Hamstring Stretch - 1 x daily - 7 x weekly - 1 sets - 3 reps - 30 hold Seated Flexion Stretch with Swiss Ball - 1 x daily - 7 x weekly - 1 sets - 10 reps - 15 hold Supine 90/90 Alternating Heel Touches with Posterior Pelvic Tilt - 1 x daily - 7 x weekly - 2 sets - 10 reps - 3 hold Hooklying Single Knee to Chest Stretch - 1 x daily - 7 x weekly - 1 sets - 3 reps - 30 hold Supine Bridge - 1 x daily - 7 x weekly - 3 sets - 10 reps - 3 hold Seated Table Piriformis Stretch - 1 x daily - 7 x weekly - 1 sets - 3 reps - 15-30 hold

## 2020-06-12 NOTE — Therapy (Signed)
[facility] [address] [place], [place], [address] Phone: [phone]   Fax:  [phone]  Physical Therapy Treatment  Patient Details  Name: [name] MRN: [id number] Date of Birth: 09/07/1961 Referring Provider (PT): [name], FNP   Encounter Date: 06/12/2020   PT End of Session - 06/12/20 0807    Visit Number 2    Number of Visits 13    Authorization Type medicaid healthy blue- 27 visits/year with Josem Kaufmann    PT Start Time 0800    PT Stop Time 0845    PT Time Calculation (min) 45 min    Activity Tolerance Patient tolerated treatment well    Behavior During Therapy [facility] for tasks assessed/performed           Past Medical History:  Diagnosis Date  . Anemia   . Arthritis   . Back pain   . CAD (coronary artery disease)    a. s/p DES to LAD in 05/2016  . Cervical radiculopathy   . Chronic diastolic CHF (congestive heart failure) ([place])   . Chronic pain   . Depression   . DVT (deep venous thrombosis) ([place])   . Hematemesis   . Hepatic steatosis   . Hyperlipidemia   . Hypertension   . IBS (irritable bowel syndrome)   . Morbid obesity ([place])   . OSA (obstructive sleep apnea)   . Pancreatitis   . PE (pulmonary thromboembolism) ([place])   . Peripheral neuropathy   . PUD (peptic ulcer disease)   . Renal disorder   . Stroke [facility])    a. ?details unclear - not seen on imaging when he was admitted in 05/2017 for TIA symptoms which were felt due to cervical radiculopathy.  . Thoracic aortic ectasia (HCC)    a. 4.3cm ectatic ascending thoracic aorta by CT 06/2017.   . Type 2 diabetes mellitus ([place])     Past Surgical History:  Procedure Laterality Date  . CARDIAC CATHETERIZATION N/A 05/31/2016   Procedure: Left Heart Cath and Coronary Angiography;  Surgeon: Peter M Martinique, MD;  Location: [place] CV LAB;  Service: Cardiovascular;  Laterality: N/A;  . CARDIAC CATHETERIZATION N/A 05/31/2016   Procedure:  Intravascular Pressure Wire/FFR Study;  Surgeon: Peter M Martinique, MD;  Location: [place] [facility];  Service: Cardiovascular;  Laterality: N/A;  . CARDIAC CATHETERIZATION N/A 05/31/2016   Procedure: Coronary Stent Intervention;  Surgeon: Peter M Martinique, MD;  Location: [place] [facility];  Service: Cardiovascular;  Laterality: N/A;  . COLONOSCOPY WITH PROPOFOL N/A 04/29/2020   Procedure: COLONOSCOPY WITH PROPOFOL;  Surgeon: Wilford Corner, MD;  Location: [place];  Service: Endoscopy;  Laterality: N/A;  . ESOPHAGOGASTRODUODENOSCOPY N/A 04/29/2020   Procedure: ESOPHAGOGASTRODUODENOSCOPY (EGD);  Surgeon: Wilford Corner, MD;  Location: [place];  Service: Endoscopy;  Laterality: N/A;  . LEFT HEART CATH AND CORONARY ANGIOGRAPHY N/A 12/08/2017   Procedure: LEFT HEART CATH AND CORONARY ANGIOGRAPHY;  Surgeon: Leonie Man, MD;  Location: [place] CV [facility];  Service: Cardiovascular;  Laterality: N/A;  . LEFT HEART CATHETERIZATION WITH CORONARY ANGIOGRAM N/A 02/03/2014   Procedure: LEFT HEART CATHETERIZATION WITH CORONARY ANGIOGRAM;  Surgeon: Pixie Casino, MD;  Location: [facility] CATH LAB;  Service: Cardiovascular;  Laterality: N/A;  . left leg stent     . POLYPECTOMY  04/29/2020   Procedure: POLYPECTOMY;  Surgeon: Wilford Corner, MD;  Location: [facility] ENDOSCOPY;  Service: Endoscopy;;    There were no vitals filed for this visit.   Subjective  Assessment - 06/12/20 0803    Subjective Patient reports that did not get any benefit from acupuncture. Patient has not had a good week due to pain.    Pertinent History neuropathy, obesity, recent GI bleed with transfusion, DM 2, 5 CVAs, atypical chest pain, HTN,    How long can you walk comfortably? <1 mile    Diagnostic tests 11/2016 lumbar x-rays: disc space flattening with mild anteriorspurring at T11-T12 level. Stable mild anterior spurring at L1-L2level. Stable moderate anterior spurring at L3-L4 level. Mildanterior spurring upper endplate of L5. Stable  mild disc spaceflattening at L5-S1 level. Mild facet degenerative changes at L4 andL5 level.    Patient Stated Goals Pt wants to be painfree    Currently in Pain? Yes    Pain Score 8     Pain Location Back    Pain Orientation Right;Lower    Pain Descriptors / Indicators Aching;Other (Comment);Dull   sometimes sharp   Pain Type Chronic pain    Pain Radiating Towards radiates down to right knee    Pain Onset More than a month ago    Pain Onset More than a month ago                             [facility] Adult PT Treatment/Exercise - 06/12/20 0001      Bed Mobility   Bed Mobility Supine to Sit;Sit to Supine    Supine to Sit Supervision/Verbal cueing    Sit to Supine Supervision/Verbal cueing      Ambulation/Gait   Ambulation/Gait Yes    Ambulation/Gait Assistance 5: Supervision    Ambulation/Gait Assistance Details into/out of therapy gym    Assistive device None    Gait Pattern Step-through pattern;Trendelenburg    Ambulation Surface Level;Indoor      Self-Care   Self-Care Other Self-Care Comments    Other Self-Care Comments  Educating on contuining to complete aerobic activity at [employer], starting at approx 5 minutes and slowly progressing time/duration by 1-2 minute incremenets as tolerated. PT also completing pain education on working slightly into pain to help further improve pain tolerance and range.       Exercises   Exercises Other Exercises    Other Exercises  Established initial HEP focused on BLE/lumbar stretching and core stabilization          Established and completed all of the following exercises as initial HEP. Patient able to demo all appropriately, did require verbal cueing at times with activites for proper TrA contraction and to avoid holding breath with completion.     Access Code: [email or web address] URL: [email or web address] Date: 06/12/2020 Prepared by: Baldomero Lamy  Exercises Seated Hamstring Stretch - 1 x daily - 7 x  weekly - 1 sets - 3 reps - 30 hold Seated Flexion Stretch with Swiss Ball - 1 x daily - 7 x weekly - 1 sets - 10 reps - 15 hold Supine 90/90 Alternating Heel Touches with Posterior Pelvic Tilt - 1 x daily - 7 x weekly - 2 sets - 10 reps - 3 hold Hooklying Single Knee to Chest Stretch - 1 x daily - 7 x weekly - 1 sets - 3 reps - 30 hold Supine Bridge - 1 x daily - 7 x weekly - 3 sets - 10 reps - 3 hold Seated Table Piriformis Stretch - 1 x daily - 7 x weekly - 1 sets - 3 reps - 15-30 hold  PT Education - 06/12/20 0845    Education Details Educated on initial HEP; contuining aerobic activity at gym    Person(s) Educated Patient    Methods Explanation;Demonstration;Handout    Comprehension Verbalized understanding;Returned demonstration;Need further instruction            PT Short Term Goals - 06/08/20 1131      PT SHORT TERM GOAL #1   Title Pt will be independent with initial HEP for strengthening and flexibility.    Baseline no current strengthening and flexiblity HEP. Doing cardio at gym.    Time 3    Period Weeks    Status New    Target Date 06/29/20      PT SHORT TERM GOAL #2   Title Pt will be instructed in proper body mechanics with activities to protect back around home and be able to return demonstrate.    Baseline has not been instructed in proper body mechanics    Time 3    Period Weeks    Status New    Target Date 06/29/20             PT Long Term Goals - 06/08/20 1134      PT LONG TERM GOAL #1   Title Pt will be independent with progress HEP for strengthening, flexibility and aerobic exercise to continue with gym program.    Baseline currently only doing cardio at gym and limited by pain    Time 6    Period Weeks    Status New    Target Date 07/20/20      PT LONG TERM GOAL #2   Title Pt will decrease 5 x sit to stand from 19.18 sec to <16 sec for improved functional strength and balance.    Baseline 19.18 sec from chair with arms on 06/08/20      Time 6    Period Weeks    Status New    Target Date 07/20/20      PT LONG TERM GOAL #3   Title Pt will decrease Oswestry score from 44% to <35% for decreased self perceived disability due to back pain.    Baseline 44% on 06/08/20    Time 6    Period Weeks    Status New    Target Date 07/20/20      PT LONG TERM GOAL #4   Title Pt will ambulate >6 min with <6/10 back pain for improved function.    Baseline 8-10/10 back pain currently. Reports only able to walk 3 min on treaadmill    Time 6    Period Weeks    Status New    Target Date 07/20/20                 Plan - 06/12/20 1109    Clinical Impression Statement Today's skilled session included establishing initial HEP focused on BLE/lumbar stretching and core stablization actvities. With completion of each exercise, PT educating on proper stretch sensation vs. pain, and ensuring proper form with completion. Patient reporting no increase in pain at end of session and tolerating well. PT also completing pain education with patient, and encouraging to continue aerobic activity at gym. Patient will benefit from skilled PT services to progress toward all goals.    Personal Factors and Comorbidities Comorbidity 3+;Time since onset of injury/illness/exacerbation    Comorbidities neuropathy, obesity, recent GI bleed with transfusion, DM 2, 5 CVAs, atypical chest pain, HTN,    Examination-Activity Limitations Sleep;Stand;Locomotion Level;Transfers;Bed Mobility  Examination-Participation Restrictions Community Activity;Meal Prep;Yard Work    Merchant [employer] officer Evolving/Moderate complexity    Rehab Potential Good    PT Frequency 2x / week   plus eval   PT Duration 6 weeks    PT Treatment/Interventions ADLs/Self Care Home Management;Aquatic Therapy;Cryotherapy;Moist Heat;DME Instruction;Gait training;Stair training;Functional mobility training;Therapeutic activities;Therapeutic exercise;Patient/family  education;Neuromuscular re-education;Balance training;Passive range of motion;Manual techniques;Spinal Manipulations;Joint Manipulations    PT Next Visit Plan How was HEP? (Review as needed) Right Hip/Lumbar Manual Therapy. Aerobic Activity (potentially determine HRR and give HR range for target aerobic activity at gym). Continue core stabilization and strengthening. Have we recieved OT referral yet?    Consulted and Agree with Plan of Care Patient           Patient will benefit from skilled therapeutic intervention in order to improve the following deficits and impairments:  Abnormal gait, Decreased range of motion, Decreased mobility, Decreased strength, Impaired flexibility, Pain, Improper body mechanics, Decreased balance, Decreased endurance  Visit Diagnosis: Abnormal posture  Other abnormalities of gait and mobility  Muscle weakness (generalized)  Chronic right-sided low back pain with right-sided sciatica     Problem List Patient Active Problem List   Diagnosis Date Noted  . Upper GI bleed 04/28/2020  . Hypertensive urgency 04/27/2020  . Acute GI bleeding 04/27/2020  . Mixed diabetic hyperlipidemia associated with type 2 diabetes mellitus ([place]) 04/27/2020  . Chronic diastolic CHF (congestive heart failure) ([place]) 08/23/2019  . ARF (acute renal failure) ([place]) 08/01/2019  . Hyperkalemia 08/01/2019  . Uncontrolled type 2 diabetes mellitus with hyperglycemia ([place]) 08/01/2019  . AKI (acute kidney injury) ([place])   . Hypotension 07/31/2019  . DKA (diabetic ketoacidoses) ([place]) 05/05/2019  . Blurred vision 05/05/2019  . Alteration consciousness 11/19/2018  . Neck pain 10/09/2018  . Left arm weakness 10/09/2018  . B12 deficiency 08/10/2017  . Persistent headaches 08/09/2017  . GERD (gastroesophageal reflux disease) 06/29/2017  . Left arm numbness   . Cerebral embolism with cerebral infarction 06/17/2017  . TIA (transient ischemic attack) 06/17/2017  . Chronic back pain  12/29/2016  . Depression 12/15/2016  . Vitamin D deficiency 12/09/2016  . Right hip pain 12/07/2016  . Hypokalemia 12/07/2016  . Type 2 diabetes mellitus with vascular disease ([place]) 05/31/2016  . Normocytic normochromic anemia 05/31/2016  . Chest pain 05/31/2016  . Coronary artery disease involving native coronary artery of native heart without angina pectoris 01/06/2016  . DVT (deep venous thrombosis) ([place]) 01/06/2016  . Lactic acidosis 05/28/2014  . Nonspecific chest pain 01/29/2014  . Uncontrolled secondary diabetes with peripheral neuropathy ([place]) 01/29/2014  . Obesity, Class III, BMI 40-49.9 (morbid obesity) ([place]) 01/29/2014  . Snoring 01/29/2014  . Dyslipidemia 01/29/2014  . HTN (hypertension) 01/29/2014  . Abnormal nuclear stress test 01/29/2014    Jones Bales, PT, DPT 06/12/2020, 11:13 AM  [facility] [address] [place], [place], [address] Phone: [phone]   Fax:  [phone]  Name: HAMZEH TALL MRN: [id number] Date of Birth: 07-31-1961

## 2020-06-15 ENCOUNTER — Ambulatory Visit: Payer: Medicaid Other

## 2020-06-15 ENCOUNTER — Other Ambulatory Visit: Payer: Self-pay

## 2020-06-15 DIAGNOSIS — R293 Abnormal posture: Secondary | ICD-10-CM | POA: Diagnosis not present

## 2020-06-15 DIAGNOSIS — R2689 Other abnormalities of gait and mobility: Secondary | ICD-10-CM

## 2020-06-15 DIAGNOSIS — M6281 Muscle weakness (generalized): Secondary | ICD-10-CM

## 2020-06-15 DIAGNOSIS — G8929 Other chronic pain: Secondary | ICD-10-CM

## 2020-06-15 DIAGNOSIS — M5441 Lumbago with sciatica, right side: Secondary | ICD-10-CM

## 2020-06-15 NOTE — Therapy (Signed)
[facility] [address] [place], [place], [address] Phone: [phone]   Fax:  [phone]  Physical Therapy Treatment  Patient Details  Name: [name] MRN: [id number] Date of Birth: 01/25/1961 Referring Provider (PT): [name], FNP   Encounter Date: 06/15/2020   PT End of Session - 06/15/20 1104    Visit Number 3    Number of Visits 13    Authorization Type medicaid healthy blue- 27 visits/year with Josem Kaufmann    PT Start Time 1100    PT Stop Time 1138    PT Time Calculation (min) 38 min    Activity Tolerance Patient tolerated treatment well    Behavior During Therapy [facility] for tasks assessed/performed           Past Medical History:  Diagnosis Date  . Anemia   . Arthritis   . Back pain   . CAD (coronary artery disease)    a. s/p DES to LAD in 05/2016  . Cervical radiculopathy   . Chronic diastolic CHF (congestive heart failure) ([place])   . Chronic pain   . Depression   . DVT (deep venous thrombosis) ([place])   . Hematemesis   . Hepatic steatosis   . Hyperlipidemia   . Hypertension   . IBS (irritable bowel syndrome)   . Morbid obesity ([place])   . OSA (obstructive sleep apnea)   . Pancreatitis   . PE (pulmonary thromboembolism) ([place])   . Peripheral neuropathy   . PUD (peptic ulcer disease)   . Renal disorder   . Stroke [facility])    a. ?details unclear - not seen on imaging when he was admitted in 05/2017 for TIA symptoms which were felt due to cervical radiculopathy.  . Thoracic aortic ectasia (HCC)    a. 4.3cm ectatic ascending thoracic aorta by CT 06/2017.   . Type 2 diabetes mellitus ([place])     Past Surgical History:  Procedure Laterality Date  . CARDIAC CATHETERIZATION N/A 05/31/2016   Procedure: Left Heart Cath and Coronary Angiography;  Surgeon: Peter M Martinique, MD;  Location: [place] [facility] LAB;  Service: Cardiovascular;  Laterality: N/A;  . CARDIAC CATHETERIZATION N/A 05/31/2016   Procedure:  Intravascular Pressure Wire/FFR Study;  Surgeon: Peter M Martinique, MD;  Location: [place] [facility];  Service: Cardiovascular;  Laterality: N/A;  . CARDIAC CATHETERIZATION N/A 05/31/2016   Procedure: Coronary Stent Intervention;  Surgeon: Peter M Martinique, MD;  Location: [place] [facility];  Service: Cardiovascular;  Laterality: N/A;  . COLONOSCOPY WITH PROPOFOL N/A 04/29/2020   Procedure: COLONOSCOPY WITH PROPOFOL;  Surgeon: Wilford Corner, MD;  Location: [place];  Service: Endoscopy;  Laterality: N/A;  . ESOPHAGOGASTRODUODENOSCOPY N/A 04/29/2020   Procedure: ESOPHAGOGASTRODUODENOSCOPY (EGD);  Surgeon: Wilford Corner, MD;  Location: [place];  Service: Endoscopy;  Laterality: N/A;  . LEFT HEART CATH AND CORONARY ANGIOGRAPHY N/A 12/08/2017   Procedure: LEFT HEART CATH AND CORONARY ANGIOGRAPHY;  Surgeon: Leonie Man, MD;  Location: [place] [facility];  Service: Cardiovascular;  Laterality: N/A;  . LEFT HEART CATHETERIZATION WITH CORONARY ANGIOGRAM N/A 02/03/2014   Procedure: LEFT HEART CATHETERIZATION WITH CORONARY ANGIOGRAM;  Surgeon: Pixie Casino, MD;  Location: [facility] CATH LAB;  Service: Cardiovascular;  Laterality: N/A;  . left leg stent     . POLYPECTOMY  04/29/2020   Procedure: POLYPECTOMY;  Surgeon: Wilford Corner, MD;  Location: [facility] ENDOSCOPY;  Service: Endoscopy;;    There were no vitals filed for this visit.   Subjective  Assessment - 06/15/20 1103    Subjective Pt reports that he was stretching this morning and felt a pop in his neck and got pain down his back and having a burning in right buttocks area.    Pertinent History neuropathy, obesity, recent GI bleed with transfusion, DM 2, 5 CVAs, atypical chest pain, HTN,    How long can you walk comfortably? <1 mile    Diagnostic tests 11/2016 lumbar x-rays: disc space flattening with mild anteriorspurring at T11-T12 level. Stable mild anterior spurring at L1-L2level. Stable moderate anterior spurring at L3-L4 level. Mildanterior  spurring upper endplate of L5. Stable mild disc spaceflattening at L5-S1 level. Mild facet degenerative changes at L4 andL5 level.    Patient Stated Goals Pt wants to be painfree    Currently in Pain? Yes    Pain Score 9     Pain Location Buttocks    Pain Orientation Right    Pain Descriptors / Indicators Burning    Pain Type Chronic pain    Pain Onset More than a month ago    Pain Frequency Constant    Pain Onset More than a month ago                             [facility] Adult PT Treatment/Exercise - 06/15/20 1104      Ambulation/Gait   Ambulation/Gait Yes    Ambulation/Gait Assistance 5: Supervision    Ambulation/Gait Assistance Details around in clinic during session    Assistive device None    Gait Pattern Step-through pattern    Ambulation Surface Level;Indoor      Exercises   Exercises Other Exercises;Knee/Hip    Other Exercises  Right single knee to chest stretch passively 30 sed x 3, right hamstring stretch with ankle on PT shoulder 30 sec x 4. Hooklying march with TA (transverse abdominus) contraction keeping back flat x 10. Pt had to sit up due to some burning in throat from reflux. Seated march with TA contraction x 10. Pt reports hip feels fine. Standing in // bars: mini-squats x 10 with verbal cues for form, stepping forward and raising opposite arm with keeping tummy tight x 10 each side. Seated hamstring stretch 30 sec x 3.       Knee/Hip Exercises: Aerobic   Other Aerobic Sci Fit x 6 min level 3 with legs only. Pt denied any pain during and back felt good after. HR prior was 78 and after 90      Manual Therapy   Manual therapy comments right hip distraction 20 sec x 3 through ankle in supine.  Pt reported some relief in hip burning initially.                  PT Education - 06/15/20 1244    Education Details Added mini-squats to HEP    Person(s) Educated Patient    Methods Explanation;Demonstration;Handout    Comprehension Verbalized  understanding;Returned demonstration            PT Short Term Goals - 06/08/20 1131      PT SHORT TERM GOAL #1   Title Pt will be independent with initial HEP for strengthening and flexibility.    Baseline no current strengthening and flexiblity HEP. Doing cardio at gym.    Time 3    Period Weeks    Status New    Target Date 06/29/20      PT SHORT TERM GOAL #2  Title Pt will be instructed in proper body mechanics with activities to protect back around home and be able to return demonstrate.    Baseline has not been instructed in proper body mechanics    Time 3    Period Weeks    Status New    Target Date 06/29/20             PT Long Term Goals - 06/08/20 1134      PT LONG TERM GOAL #1   Title Pt will be independent with progress HEP for strengthening, flexibility and aerobic exercise to continue with gym program.    Baseline currently only doing cardio at gym and limited by pain    Time 6    Period Weeks    Status New    Target Date 07/20/20      PT LONG TERM GOAL #2   Title Pt will decrease 5 x sit to stand from 19.18 sec to <16 sec for improved functional strength and balance.    Baseline 19.18 sec from chair with arms on 06/08/20    Time 6    Period Weeks    Status New    Target Date 07/20/20      PT LONG TERM GOAL #3   Title Pt will decrease Oswestry score from 44% to <35% for decreased self perceived disability due to back pain.    Baseline 44% on 06/08/20    Time 6    Period Weeks    Status New    Target Date 07/20/20      PT LONG TERM GOAL #4   Title Pt will ambulate >6 min with <6/10 back pain for improved function.    Baseline 8-10/10 back pain currently. Reports only able to walk 3 min on treaadmill    Time 6    Period Weeks    Status New    Target Date 07/20/20                 Plan - 06/15/20 1138    Clinical Impression Statement Pain down to 2-3/10 at end of session. Pt responded good to core stab and aerobic exercise to decrease  pain.    Personal Factors and Comorbidities Comorbidity 3+;Time since onset of injury/illness/exacerbation    Comorbidities neuropathy, obesity, recent GI bleed with transfusion, DM 2, 5 CVAs, atypical chest pain, HTN,    Examination-Activity Limitations Sleep;Stand;Locomotion Level;Transfers;Bed Mobility    Examination-Participation Restrictions Community Activity;Meal Prep;Yard Work    Merchant navy officer Evolving/Moderate complexity    Rehab Potential Good    PT Frequency 2x / week   plus eval   PT Duration 6 weeks    PT Treatment/Interventions ADLs/Self Care Home Management;Aquatic Therapy;Cryotherapy;Moist Heat;DME Instruction;Gait training;Stair training;Functional mobility training;Therapeutic activities;Therapeutic exercise;Patient/family education;Neuromuscular re-education;Balance training;Passive range of motion;Manual techniques;Spinal Manipulations;Joint Manipulations    PT Next Visit Plan How was HEP? (Review as needed) Right Hip/Lumbar Manual Therapy. Aerobic Activity (potentially determine HRR and give HR range for target aerobic activity at gym). Continue core stabilization and strengthening. Have we recieved OT referral yet?    Consulted and Agree with Plan of Care Patient           Patient will benefit from skilled therapeutic intervention in order to improve the following deficits and impairments:  Abnormal gait, Decreased range of motion, Decreased mobility, Decreased strength, Impaired flexibility, Pain, Improper body mechanics, Decreased balance, Decreased endurance  Visit Diagnosis: Other abnormalities of gait and mobility  Muscle weakness (generalized)  Chronic right-sided  low back pain with right-sided sciatica     Problem List Patient Active Problem List   Diagnosis Date Noted  . Upper GI bleed 04/28/2020  . Hypertensive urgency 04/27/2020  . Acute GI bleeding 04/27/2020  . Mixed diabetic hyperlipidemia associated with type 2 diabetes  mellitus ([place]) 04/27/2020  . Chronic diastolic CHF (congestive heart failure) ([place]) 08/23/2019  . ARF (acute renal failure) ([place]) 08/01/2019  . Hyperkalemia 08/01/2019  . Uncontrolled type 2 diabetes mellitus with hyperglycemia ([place]) 08/01/2019  . AKI (acute kidney injury) ([place])   . Hypotension 07/31/2019  . DKA (diabetic ketoacidoses) ([place]) 05/05/2019  . Blurred vision 05/05/2019  . Alteration consciousness 11/19/2018  . Neck pain 10/09/2018  . Left arm weakness 10/09/2018  . B12 deficiency 08/10/2017  . Persistent headaches 08/09/2017  . GERD (gastroesophageal reflux disease) 06/29/2017  . Left arm numbness   . Cerebral embolism with cerebral infarction 06/17/2017  . TIA (transient ischemic attack) 06/17/2017  . Chronic back pain 12/29/2016  . Depression 12/15/2016  . Vitamin D deficiency 12/09/2016  . Right hip pain 12/07/2016  . Hypokalemia 12/07/2016  . Type 2 diabetes mellitus with vascular disease ([place]) 05/31/2016  . Normocytic normochromic anemia 05/31/2016  . Chest pain 05/31/2016  . Coronary artery disease involving native coronary artery of native heart without angina pectoris 01/06/2016  . DVT (deep venous thrombosis) ([place]) 01/06/2016  . Lactic acidosis 05/28/2014  . Nonspecific chest pain 01/29/2014  . Uncontrolled secondary diabetes with peripheral neuropathy ([place]) 01/29/2014  . Obesity, Class III, BMI 40-49.9 (morbid obesity) ([place]) 01/29/2014  . Snoring 01/29/2014  . Dyslipidemia 01/29/2014  . HTN (hypertension) 01/29/2014  . Abnormal nuclear stress test 01/29/2014    Electa Sniff, PT, DPT, NCS 06/15/2020, 12:46 PM  [facility] [address] [place], [place], [address] Phone: [phone]   Fax:  [phone]  Name: [name] MRN: [id number] Date of Birth: April 26, 1961

## 2020-06-15 NOTE — Patient Instructions (Signed)
Access Code: [email or web address] URL: [email or web address] Date: 06/15/2020 Prepared by: Cherly Anderson  Exercises Seated Hamstring Stretch - 1 x daily - 7 x weekly - 1 sets - 3 reps - 30 hold Seated Flexion Stretch with Swiss Ball - 1 x daily - 7 x weekly - 1 sets - 10 reps - 15 hold Supine 90/90 Alternating Heel Touches with Posterior Pelvic Tilt - 1 x daily - 7 x weekly - 2 sets - 10 reps - 3 hold Hooklying Single Knee to Chest Stretch - 1 x daily - 7 x weekly - 1 sets - 3 reps - 30 hold Supine Bridge - 1 x daily - 7 x weekly - 3 sets - 10 reps - 3 hold Seated Table Piriformis Stretch - 1 x daily - 7 x weekly - 1 sets - 3 reps - 15-30 hold Mini Squat with Counter Support - 1 x daily - 7 x weekly - 1 sets - 10 reps

## 2020-06-18 ENCOUNTER — Ambulatory Visit: Payer: Medicaid Other | Admitting: Internal Medicine

## 2020-06-18 ENCOUNTER — Encounter: Payer: Self-pay | Admitting: Internal Medicine

## 2020-06-18 ENCOUNTER — Other Ambulatory Visit: Payer: Self-pay

## 2020-06-18 VITALS — BP 110/60 | HR 72 | Ht 70.0 in | Wt 324.8 lb

## 2020-06-18 DIAGNOSIS — E785 Hyperlipidemia, unspecified: Secondary | ICD-10-CM | POA: Diagnosis not present

## 2020-06-18 DIAGNOSIS — E1165 Type 2 diabetes mellitus with hyperglycemia: Secondary | ICD-10-CM

## 2020-06-18 DIAGNOSIS — E781 Pure hyperglyceridemia: Secondary | ICD-10-CM | POA: Diagnosis not present

## 2020-06-18 LAB — LIPID PANEL
Chol/HDL Ratio: 8.8 ratio — ABNORMAL HIGH (ref 0.0–5.0)
Cholesterol, Total: 210 mg/dL — ABNORMAL HIGH (ref 100–199)
HDL: 24 mg/dL — ABNORMAL LOW (ref >39)
Triglycerides: 1849 mg/dL (ref 0–149)

## 2020-06-18 MED ORDER — FENOFIBRATE 145 MG PO TABS: 145.0000 mg | ORAL_TABLET | Freq: Every day | ORAL | 3 refills | Status: DC

## 2020-06-18 NOTE — Progress Notes (Signed)
LIPID OFFICE NOTE  Chief Complaint:  Follow-up lipids  Primary Care Physician: [name], FNP  Primary Cardiologist:  Kirk Ruths, MD  HPI:  [name] is a 59 y.o. male who is being seen today for the evaluation of hypertriglyceridemia at the request of [name], FNP. [name] is a pleasant 59 year old male patient who I previously took care of back in 2015.  In fact he underwent cardiac catheterization by myself which showed just moderate LAD disease at the time.  He had been managed medically for this.  Other comorbidities include morbid obesity, insulin-dependent diabetes with recently worse blood sugar controlled hemoglobin A1c 9.8.  He has dyslipidemia with persistently elevated triglycerides which has ranged between 202 and 250 on treatment with atorvastatin 80 mg, however recently a repeat lipid profile showed markdly elevated triglycerides near 600.  Over the past year he is suffered from chronic pain and is received steroids, he also has a history of pancreatitis in the past.  Is not clear whether this was related to elevated triglycerides or not.  He ultimately had progression of coronary disease and received a stent to the LAD in August 2017 and transition care over to Dr. Stanford Breed.  He is currently referred today for evaluation and management of elevated triglycerides.   08/24/2020  [name] is seen today in follow-up.  Over the summer he was hospitalized for DKA/hyperglycemia.  He was hypotensive and he was concerned about possible anaphylaxis as he had just started Vascepa.  He also mentioned that he thought that it increased his blood sugar however there is little evidence of this.  According to the hospitalist physician Dr. Broadus John, the medication was held pending follow-up with me.  Subsequently had been admitted several times with hyperglycemia, infection and other issues including chest pain.  Hemoglobin A1c is 0 poorly controlled at 11.6 despite  being on insulin at this point.  Triglycerides again were rechecked at 529.  02/12/2020  [name] returns for follow-up.  He has had some improvement in his triglycerides.  Recent lipids show total cholesterol 113, triglycerides 325, HDL 33 and LDL of 32.  Triglycerides are down from 529 and the LDL is further decreased from 51.  He reports some improvement in his glycemic control.  06/18/2020  [name] is seen today in follow-up.  He was doing well with his triglycerides however recently was admitted had some GI bleeding issues.  Hemoglobin A1c has been trending up all the way to 12.4.  He said he had some issues with his insulin is not working together and his endocrinologist switched him out.  Since then apparently his numbers are better but his labs yesterday showed very poorly controlled triglycerides.  Total cholesterol was 210, triglycerides 1849 and HDL 24.  LDL cannot be calculated due to the significant lipemia.  PMHx:  Past Medical History:  Diagnosis Date  . Anemia   . Arthritis   . Back pain   . CAD (coronary artery disease)    a. s/p DES to LAD in 05/2016  . Cervical radiculopathy   . Chronic diastolic CHF (congestive heart failure) ([place])   . Chronic pain   . Depression   . DVT (deep venous thrombosis) ([place])   . Hematemesis   . Hepatic steatosis   . Hyperlipidemia   . Hypertension   . IBS (irritable bowel syndrome)   . Morbid obesity ([place])   . OSA (obstructive sleep apnea)   . Pancreatitis   .  PE (pulmonary thromboembolism) ([place])   . Peripheral neuropathy   . PUD (peptic ulcer disease)   . Renal disorder   . Stroke [facility])    a. ?details unclear - not seen on imaging when he was admitted in 05/2017 for TIA symptoms which were felt due to cervical radiculopathy.  . Thoracic aortic ectasia ([facility])    a. 4.3cm ectatic ascending thoracic aorta by CT 06/2017.   . Type 2 diabetes mellitus ([place])     Past Surgical History:  Procedure Laterality Date  . CARDIAC  CATHETERIZATION N/A 05/31/2016   Procedure: Left Heart Cath and Coronary Angiography;  Surgeon: Peter M Martinique, MD;  Location: [place] CV [facility];  Service: Cardiovascular;  Laterality: N/A;  . CARDIAC CATHETERIZATION N/A 05/31/2016   Procedure: Intravascular Pressure Wire/FFR Study;  Surgeon: Peter M Martinique, MD;  Location: [place] [facility];  Service: Cardiovascular;  Laterality: N/A;  . CARDIAC CATHETERIZATION N/A 05/31/2016   Procedure: Coronary Stent Intervention;  Surgeon: Peter M Martinique, MD;  Location: [place] [facility];  Service: Cardiovascular;  Laterality: N/A;  . COLONOSCOPY WITH PROPOFOL N/A 04/29/2020   Procedure: COLONOSCOPY WITH PROPOFOL;  Surgeon: Wilford Corner, MD;  Location: [place];  Service: Endoscopy;  Laterality: N/A;  . ESOPHAGOGASTRODUODENOSCOPY N/A 04/29/2020   Procedure: ESOPHAGOGASTRODUODENOSCOPY (EGD);  Surgeon: Wilford Corner, MD;  Location: [place];  Service: Endoscopy;  Laterality: N/A;  . LEFT HEART CATH AND CORONARY ANGIOGRAPHY N/A 12/08/2017   Procedure: LEFT HEART CATH AND CORONARY ANGIOGRAPHY;  Surgeon: Leonie Man, MD;  Location: [place] [facility];  Service: Cardiovascular;  Laterality: N/A;  . LEFT HEART CATHETERIZATION WITH CORONARY ANGIOGRAM N/A 02/03/2014   Procedure: LEFT HEART CATHETERIZATION WITH CORONARY ANGIOGRAM;  Surgeon: Pixie Casino, MD;  Location: [facility] CATH LAB;  Service: Cardiovascular;  Laterality: N/A;  . left leg stent     . POLYPECTOMY  04/29/2020   Procedure: POLYPECTOMY;  Surgeon: Wilford Corner, MD;  Location: [facility] ENDOSCOPY;  Service: Endoscopy;;    FAMHx:  Family History  Problem Relation Age of Onset  . Cancer Father   . Hypertension Mother   . Diabetes Mother   . Breast cancer Mother   . Hypertension Brother   . Diabetes Brother   . Hypertension Sister   . Diabetes Sister     SOCHx:   reports that he has never smoked. He has never used smokeless tobacco. He reports that he does not drink alcohol and does not  use drugs.  ALLERGIES:  Allergies  Allergen Reactions  . Coconut Flavor [Flavoring Agent] Hives  . Coconut Oil Hives  . Ibuprofen Other (See Comments)    Made gastric ulcers worse  . Aleve [Naproxen] Other (See Comments)    DUE TO KIDNEYS  . Nsaids Other (See Comments)    Stomach ulcers   . Vascepa [Icosapent Ethyl] Other (See Comments)    headaches, chest pain, similar to sx of a stroke, hypotension     ROS: Pertinent items noted in HPI and remainder of comprehensive ROS otherwise negative.  HOME MEDS: Current Outpatient Medications on File Prior to Visit  Medication Sig Dispense Refill  . Accu-Chek FastClix Lancets MISC 1 Device by Other route 2 (two) times daily.     . Albuterol Sulfate (PROAIR RESPICLICK) 790 (90 Base) MCG/ACT AEPB Inhale 2 puffs into the lungs every 6 (six) hours as needed. (Patient taking differently: Inhale 2 puffs into the lungs every 6 (six) hours as needed (for breathing). ) 1 each 0  .  allopurinol (ZYLOPRIM) 100 MG tablet Take 100 mg by mouth daily.    Marland Kitchen aspirin 81 MG chewable tablet Chew 1 tablet (81 mg total) by mouth daily. 30 tablet 0  . carvedilol (COREG) 25 MG tablet Take 1 tablet (25 mg total) by mouth 2 (two) times daily with a meal. 180 tablet 3  . CVS VITAMIN B12 1000 MCG tablet Take 1,000 mcg by mouth daily.    . diclofenac (FLECTOR) 1.3 % PTCH Place 1 patch onto the skin daily as needed for pain.    . DULoxetine (CYMBALTA) 60 MG capsule Take 1 capsule (60 mg total) by mouth daily. Please request future refills from PCP. 30 capsule 1  . famotidine (PEPCID) 20 MG tablet Take 20 mg by mouth daily.    . fenofibrate (TRICOR) 145 MG tablet Take 1 tablet (145 mg total) by mouth daily. 90 tablet 3  . furosemide (LASIX) 40 MG tablet Take 1 tablet (40 mg total) by mouth 3 (three) times daily. 90 tablet 0  . glipiZIDE (GLUCOTROL) 10 MG tablet Take 10 mg by mouth 2 (two) times daily.  4  . HUMULIN R U-500 KWIKPEN 500 UNIT/ML kwikpen SMARTSIG:45 Unit(s)  SUB-Q 3 Times Daily PRN    . Insulin Pen Needle 31G X 5 MM MISC Use 1 needle daily to inject insulin as prescribed (Patient taking differently: 1 each by Other route See admin instructions. Use 1 needle daily to inject insulin as prescribed) 100 each 2  . isosorbide mononitrate (IMDUR) 30 MG 24 hr tablet Take 1 tablet (30 mg total) by mouth daily. 90 tablet 3  . lisinopril (ZESTRIL) 5 MG tablet Take 5 mg by mouth daily.    . nitroGLYCERIN (NITROSTAT) 0.4 MG SL tablet Place 1 tablet (0.4 mg total) under the tongue every 5 (five) minutes as needed for chest pain. 100 tablet 1  . ondansetron (ZOFRAN) 4 MG tablet Take 4 mg by mouth 2 (two) times daily as needed for nausea/vomiting.    Marland Kitchen oxyCODONE (ROXICODONE) 15 MG immediate release tablet Take 15 mg 3 (three) times daily as needed by mouth for pain.   0  . pantoprazole (PROTONIX) 40 MG tablet Take 1 tablet (40 mg total) by mouth 2 (two) times daily.    . potassium chloride (MICRO-K) 10 MEQ CR capsule Take 10 mEq by mouth in the morning and at bedtime.    . pregabalin (LYRICA) 300 MG capsule Take 300 mg by mouth 2 (two) times daily.    . sertraline (ZOLOFT) 50 MG tablet Take 50 mg by mouth daily.    Marland Kitchen tiZANidine (ZANAFLEX) 4 MG tablet Take 4 mg by mouth 3 (three) times daily as needed for muscle spasms.    . traZODone (DESYREL) 50 MG tablet Take 1 tablet (50 mg total) by mouth at bedtime. 30 tablet 11  . Vitamin D, Ergocalciferol, (DRISDOL) 1.25 MG (50000 UT) CAPS capsule Take 50,000 Units by mouth once a week. mondays    . XTAMPZA ER 27 MG C12A Take 27 mg by mouth 2 (two) times daily.     Marland Kitchen atorvastatin (LIPITOR) 40 MG tablet Take 1 tablet (40 mg total) by mouth daily. 90 tablet 3   No current facility-administered medications on file prior to visit.    LABS/IMAGING: Results for orders placed or performed in visit on 02/12/20 (from the past 48 hour(s))  Lipid panel     Status: Abnormal   Collection Time: 06/17/20  8:54 AM  Result Value Ref Range  Cholesterol, Total 210 (H) 100 - 199 mg/dL   Triglycerides 1,849 (HH) 0 - 149 mg/dL    Comment: Results confirmed on dilution.    HDL 24 (L) >39 mg/dL   VLDL Cholesterol Cal Comment (A) 5 - 40 mg/dL    Comment: The calculation for the VLDL cholesterol is not valid when triglyceride level is >800 mg/dL.    LDL Chol Calc (NIH) Comment (A) 0 - 99 mg/dL    Comment: Triglyceride result indicated is too high for an accurate LDL cholesterol estimation.    Chol/HDL Ratio 8.8 (H) 0.0 - 5.0 ratio    Comment:                                   T. Chol/HDL Ratio                                             Men  Women                               1/2 Avg.Risk  3.4    3.3                                   Avg.Risk  5.0    4.4                                2X Avg.Risk  9.6    7.1                                3X Avg.Risk 23.4   11.0    No results found.  LIPID PANEL:    Component Value Date/Time   CHOL 210 (H) 06/17/2020 0854   TRIG 1,849 (HH) 06/17/2020 0854   HDL 24 (L) 06/17/2020 0854   CHOLHDL 8.8 (H) 06/17/2020 0854   CHOLHDL 4.3 03/08/2019 1008   VLDL 43 (H) 12/08/2017 0212   LDLCALC Comment (A) 06/17/2020 0854   LDLCALC  03/08/2019 1008     Comment:     . LDL cholesterol not calculated. Triglyceride levels greater than 400 mg/dL invalidate calculated LDL results. . Reference range: <100 . Desirable range <100 mg/dL for primary prevention;   <70 mg/dL for patients with CHD or diabetic patients  with > or = 2 CHD risk factors. Marland Kitchen LDL-C is now calculated using the Martin-Hopkins  calculation, which is a validated novel method providing  better accuracy than the Friedewald equation in the  estimation of LDL-C.  Cresenciano Genre et al. Annamaria Helling. 7829;562(13): 2061-2068  (http://education.[employer].com/faq/FAQ164)    LDLDIRECT 48 02/10/2020 0929    WEIGHTS: Wt Readings from Last 3 Encounters:  06/18/20 (!) 324 lb 12.8 oz (147.3 kg)  02/12/20 (!) 328 lb (148.8 kg)   01/14/20 (!) 312 lb (141.5 kg)    VITALS: BP 110/60   Pulse 72   Ht 5\' 10"  (1.778 m)   Wt (!) 324 lb 12.8 oz (147.3 kg)   BMI 46.60 kg/m   EXAM: Deferred  EKG: Deferred  ASSESSMENT: 1. Coronary artery disease status post PCI  to the LAD (2017) 2. Insulin-dependent diabetes-uncontrolled, hemoglobin A1c 11.6 3. Morbid obesity with recent weight loss 4. Hypertension 5. Mixed dyslipidemia with very high triglycerides 6. Intermittent prescription steroid use 7. History of pancreatitis  PLAN: 1.   Mr. Hilbun has recently had poorly controlled diabetes with hemoglobin A1c up to 12.4.  He said his insulin was changed around by his endocrinologist.  His triglycerides were improving up until the point where they became significantly elevated based on lab work yesterday.  He is at high risk for recurrent pancreatitis.  Despite the fact that we have prescribed and fenofibrate, he is not clear that he is taking the medication.  He says he gets all of his meds and blister packs from [place].  We will see if we can for him if this medication is being put into his medication packs and taken.  He was not tolerant of Vascepa in the past.  I will also recheck to see if he might be a candidate for clinical trial however I doubt this is familial chylomicronemia syndrome (FCS) given lack of family history and the fact that it seems to be driven by diet and glycemic control.  He may be a candidate for a later add-on study.  Pixie Casino, MD, [facility], [place] Director of the Advanced Lipid Disorders &  Cardiovascular Risk Reduction Clinic Diplomate of the American Board of Clinical Lipidology Attending Cardiologist  Direct Dial: [phone]  Fax: [phone]  Website:  [email or web address]  Nadean Corwin Carie Kapuscinski 06/18/2020, 8:28 AM

## 2020-06-18 NOTE — Patient Instructions (Signed)
Medication Instructions:  START fenofibrate daily  *If you need a refill on your cardiac medications before your next appointment, please call your pharmacy*   Lab Work: FASTING lipid panel/direct LDL in 6 months to check cholesterol   If you have labs (blood work) drawn today and your tests are completely normal, you will receive your results only by: Marland Kitchen MyChart Message (if you have MyChart) OR . A paper copy in the mail If you have any lab test that is abnormal or we need to change your treatment, we will call you to review the results.   Testing/Procedures: NONE   Follow-Up: At [facility], you and your health needs are our priority.  As part of our continuing mission to provide you with exceptional heart care, we have created designated Provider Care Teams.  These Care Teams include your primary Cardiologist (physician) and Advanced Practice Providers (APPs -  Physician Assistants and Nurse Practitioners) who all work together to provide you with the care you need, when you need it.  We recommend signing up for the patient portal called "MyChart".  Sign up information is provided on this After Visit Summary.  MyChart is used to connect with patients for Virtual Visits (Telemedicine).  Patients are able to view lab/test results, encounter notes, upcoming appointments, etc.  Non-urgent messages can be sent to your provider as well.   To learn more about what you can do with MyChart, go to [email or web address].    Your next appointment:   6 month(s) - lipid clinic  The format for your next appointment:   In Person  Provider:    Dr. Lyman Bishop    Other Instructions

## 2020-06-19 ENCOUNTER — Ambulatory Visit: Payer: Medicaid Other

## 2020-06-19 DIAGNOSIS — R293 Abnormal posture: Secondary | ICD-10-CM | POA: Diagnosis not present

## 2020-06-19 DIAGNOSIS — R2689 Other abnormalities of gait and mobility: Secondary | ICD-10-CM

## 2020-06-19 DIAGNOSIS — G8929 Other chronic pain: Secondary | ICD-10-CM

## 2020-06-19 DIAGNOSIS — M6281 Muscle weakness (generalized): Secondary | ICD-10-CM

## 2020-06-19 NOTE — Therapy (Signed)
[facility] [address] [place], [place], [address] Phone: [phone]   Fax:  [phone]  Physical Therapy Treatment  Patient Details  Name: [name] MRN: [id number] Date of Birth: July 09, 1961 Referring Provider (PT): [name], FNP   Encounter Date: 06/19/2020   PT End of Session - 06/19/20 1153    Visit Number 4    Number of Visits 13    Authorization Type medicaid healthy blue- 27 visits/year with Josem Kaufmann    PT Start Time 1146    PT Stop Time 1230    PT Time Calculation (min) 44 min    Activity Tolerance Patient tolerated treatment well    Behavior During Therapy [facility] for tasks assessed/performed           Past Medical History:  Diagnosis Date  . Anemia   . Arthritis   . Back pain   . CAD (coronary artery disease)    a. s/p DES to LAD in 05/2016  . Cervical radiculopathy   . Chronic diastolic CHF (congestive heart failure) ([place])   . Chronic pain   . Depression   . DVT (deep venous thrombosis) ([place])   . Hematemesis   . Hepatic steatosis   . Hyperlipidemia   . Hypertension   . IBS (irritable bowel syndrome)   . Morbid obesity ([place])   . OSA (obstructive sleep apnea)   . Pancreatitis   . PE (pulmonary thromboembolism) ([place])   . Peripheral neuropathy   . PUD (peptic ulcer disease)   . Renal disorder   . Stroke [facility])    a. ?details unclear - not seen on imaging when he was admitted in 05/2017 for TIA symptoms which were felt due to cervical radiculopathy.  . Thoracic aortic ectasia (H[facility])    a. 4.3cm ectatic ascending thoracic aorta by CT 06/2017.   . Type 2 diabetes mellitus ([place])     Past Surgical History:  Procedure Laterality Date  . CARDIAC CATHETERIZATION N/A 05/31/2016   Procedure: Left Heart Cath and Coronary Angiography;  Surgeon: Peter M Martinique, MD;  Location: [place] CV [facility];  Service: Cardiovascular;  Laterality: N/A;  . CARDIAC CATHETERIZATION N/A 05/31/2016   Procedure:  Intravascular Pressure Wire/FFR Study;  Surgeon: Peter M Martinique, MD;  Location: [facility] [facility];  Service: Cardiovascular;  Laterality: N/A;  . CARDIAC CATHETERIZATION N/A 05/31/2016   Procedure: Coronary Stent Intervention;  Surgeon: Peter M Martinique, MD;  Location: [place] [facility];  Service: Cardiovascular;  Laterality: N/A;  . COLONOSCOPY WITH PROPOFOL N/A 04/29/2020   Procedure: COLONOSCOPY WITH PROPOFOL;  Surgeon: Wilford Corner, MD;  Location: [facility];  Service: Endoscopy;  Laterality: N/A;  . ESOPHAGOGASTRODUODENOSCOPY N/A 04/29/2020   Procedure: ESOPHAGOGASTRODUODENOSCOPY (EGD);  Surgeon: Wilford Corner, MD;  Location: [facility];  Service: Endoscopy;  Laterality: N/A;  . LEFT HEART CATH AND CORONARY ANGIOGRAPHY N/A 12/08/2017   Procedure: LEFT HEART CATH AND CORONARY ANGIOGRAPHY;  Surgeon: Leonie Man, MD;  Location: [facility];  Service: Cardiovascular;  Laterality: N/A;  . LEFT HEART CATHETERIZATION WITH CORONARY ANGIOGRAM N/A 02/03/2014   Procedure: LEFT HEART CATHETERIZATION WITH CORONARY ANGIOGRAM;  Surgeon: Pixie Casino, MD;  Location: [facility] CATH LAB;  Service: Cardiovascular;  Laterality: N/A;  . left leg stent     . POLYPECTOMY  04/29/2020   Procedure: POLYPECTOMY;  Surgeon: Wilford Corner, MD;  Location: [facility] ENDOSCOPY;  Service: Endoscopy;;    There were no vitals filed for this visit.   Subjective  Assessment - 06/19/20 1151    Subjective Patient reports hip/back not doing well this morning.    Pertinent History neuropathy, obesity, recent GI bleed with transfusion, DM 2, 5 CVAs, atypical chest pain, HTN,    How long can you walk comfortably? <1 mile    Diagnostic tests 11/2016 lumbar x-rays: disc space flattening with mild anteriorspurring at T11-T12 level. Stable mild anterior spurring at L1-L2level. Stable moderate anterior spurring at L3-L4 level. Mildanterior spurring upper endplate of L5. Stable mild disc spaceflattening at L5-S1 level. Mild facet  degenerative changes at L4 andL5 level.    Patient Stated Goals Pt wants to be painfree    Currently in Pain? Yes    Pain Score 9     Pain Location Back   + Hip; patient reports hard to differentiate   Pain Orientation Right    Pain Descriptors / Indicators Aching;Dull    Pain Type Chronic pain    Pain Radiating Towards radiating down to R knee    Pain Onset More than a month ago    Pain Onset More than a month ago                             [facility] Adult PT Treatment/Exercise - 06/19/20 0001      Ambulation/Gait   Ambulation/Gait Yes    Ambulation/Gait Assistance 5: Supervision    Ambulation/Gait Assistance Details around therapy gym during session    Ambulation Distance (Feet) --   clinic distances   Assistive device None    Gait Pattern Step-through pattern    Ambulation Surface Level;Indoor      Self-Care   Self-Care Other Self-Care Comments    Other Self-Care Comments  PT educating on importance of physical actviity and benefits of exercise for arthritis in terms of stiffness.       Exercises   Exercises Other Exercises    Other Exercises  Completed repeated lumbar extensions 2 x 10 reps in seated position, patient reports improvements in back pain.      Knee/Hip Exercises: Stretches   Other Knee/Hip Stretches completed seated flexion stretch with therapyball directly forward x 10 reps, with 3 second hold.     Other Knee/Hip Stretches single knee to chest completed, 2 x 30 seconds on BLE.       Knee/Hip Exercises: Aerobic   Other Aerobic SciFit x 8 minutes on Level 2.0 with BLE only. HR was 82 prior. PT educated on working at 65-70% of HRMax, which for the patient is 106 - 115. At end of completion, Patient HR was 108. PT educating on ways to monitor HR including utilizing machines or taking manually. PT also educating if unable to reach HR in 12-15 minutes of aerobic activity, increasing resistance on machine.       Manual Therapy   Manual Therapy Soft  tissue mobilization    Manual therapy comments PT completed hip distraction to RLE, 4 bouts of 20-25 seconds through ankle. Patient reported relief of hip pain during completion, but returns after manual therapy.     Soft tissue mobilization patient has increased tightness in bilateral lumbar paraspinals, completed STM x 3 minutes to the area. futher progressing to right ITB x 2 minutes to further help reduce hip pain/tightness.                     PT Short Term Goals - 06/08/20 1131      PT SHORT  TERM GOAL #1   Title Pt will be independent with initial HEP for strengthening and flexibility.    Baseline no current strengthening and flexiblity HEP. Doing cardio at gym.    Time 3    Period Weeks    Status New    Target Date 06/29/20      PT SHORT TERM GOAL #2   Title Pt will be instructed in proper body mechanics with activities to protect back around home and be able to return demonstrate.    Baseline has not been instructed in proper body mechanics    Time 3    Period Weeks    Status New    Target Date 06/29/20             PT Long Term Goals - 06/08/20 1134      PT LONG TERM GOAL #1   Title Pt will be independent with progress HEP for strengthening, flexibility and aerobic exercise to continue with gym program.    Baseline currently only doing cardio at gym and limited by pain    Time 6    Period Weeks    Status New    Target Date 07/20/20      PT LONG TERM GOAL #2   Title Pt will decrease 5 x sit to stand from 19.18 sec to <16 sec for improved functional strength and balance.    Baseline 19.18 sec from chair with arms on 06/08/20    Time 6    Period Weeks    Status New    Target Date 07/20/20      PT LONG TERM GOAL #3   Title Pt will decrease Oswestry score from 44% to <35% for decreased self perceived disability due to back pain.    Baseline 44% on 06/08/20    Time 6    Period Weeks    Status New    Target Date 07/20/20      PT LONG TERM GOAL #4    Title Pt will ambulate >6 min with <6/10 back pain for improved function.    Baseline 8-10/10 back pain currently. Reports only able to walk 3 min on treaadmill    Time 6    Period Weeks    Status New    Target Date 07/20/20                 Plan - 06/19/20 1241    Clinical Impression Statement Patient continues to report improved pain with movement and aerobic exercise. With manual therapy (R Hip distraction), patient has minimal improvements with pain returning to prior level after completion. Educated and calculated 65-70% of HR max to allow for patient to allow for furhter challenge at gym with completion of aerobic activity. Will continue to progress.    Personal Factors and Comorbidities Comorbidity 3+;Time since onset of injury/illness/exacerbation    Comorbidities neuropathy, obesity, recent GI bleed with transfusion, DM 2, 5 CVAs, atypical chest pain, HTN,    Examination-Activity Limitations Sleep;Stand;Locomotion Level;Transfers;Bed Mobility    Examination-Participation Restrictions Community Activity;Meal Prep;Yard Work    [employer] Evolving/Moderate complexity    Rehab Potential Good    PT Frequency 2x / week   plus eval   PT Duration 6 weeks    PT Treatment/Interventions ADLs/Self Care Home Management;Aquatic Therapy;Cryotherapy;Moist Heat;DME Instruction;Gait training;Stair training;Functional mobility training;Therapeutic activities;Therapeutic exercise;Patient/family education;Neuromuscular re-education;Balance training;Passive range of motion;Manual techniques;Spinal Manipulations;Joint Manipulations    PT Next Visit Plan Right Hip/Lumbar Manual Therapy. Did you track HR at gym?  Continue core stabilization and strengthening. Have we recieved OT referral yet?    Consulted and Agree with Plan of Care Patient           Patient will benefit from skilled therapeutic intervention in order to improve the following deficits and impairments:  Abnormal  gait, Decreased range of motion, Decreased mobility, Decreased strength, Impaired flexibility, Pain, Improper body mechanics, Decreased balance, Decreased endurance  Visit Diagnosis: Other abnormalities of gait and mobility  Chronic right-sided low back pain with right-sided sciatica  Muscle weakness (generalized)  Abnormal posture     Problem List Patient Active Problem List   Diagnosis Date Noted  . Upper GI bleed 04/28/2020  . Hypertensive urgency 04/27/2020  . Acute GI bleeding 04/27/2020  . Mixed diabetic hyperlipidemia associated with type 2 diabetes mellitus ([place]) 04/27/2020  . Chronic diastolic CHF (congestive heart failure) ([place]) 08/23/2019  . ARF (acute renal failure) ([place]) 08/01/2019  . Hyperkalemia 08/01/2019  . Uncontrolled type 2 diabetes mellitus with hyperglycemia ([place]) 08/01/2019  . AKI (acute kidney injury) ([place])   . Hypotension 07/31/2019  . DKA (diabetic ketoacidoses) ([place]) 05/05/2019  . Blurred vision 05/05/2019  . Alteration consciousness 11/19/2018  . Neck pain 10/09/2018  . Left arm weakness 10/09/2018  . B12 deficiency 08/10/2017  . Persistent headaches 08/09/2017  . GERD (gastroesophageal reflux disease) 06/29/2017  . Left arm numbness   . Cerebral embolism with cerebral infarction 06/17/2017  . TIA (transient ischemic attack) 06/17/2017  . Chronic back pain 12/29/2016  . Depression 12/15/2016  . Vitamin D deficiency 12/09/2016  . Right hip pain 12/07/2016  . Hypokalemia 12/07/2016  . Type 2 diabetes mellitus with vascular disease ([place]) 05/31/2016  . Normocytic normochromic anemia 05/31/2016  . Chest pain 05/31/2016  . Coronary artery disease involving native coronary artery of native heart without angina pectoris 01/06/2016  . DVT (deep venous thrombosis) ([place]) 01/06/2016  . Lactic acidosis 05/28/2014  . Nonspecific chest pain 01/29/2014  . Uncontrolled secondary diabetes with peripheral neuropathy ([place]) 01/29/2014  . Obesity, Class III,  BMI 40-49.9 (morbid obesity) ([name]) 01/29/2014  . Snoring 01/29/2014  . Dyslipidemia 01/29/2014  . HTN (hypertension) 01/29/2014  . Abnormal nuclear stress test 01/29/2014    Jones Bales, PT, DPT 06/19/2020, 12:47 PM  [facility] [address] [place], [place], [address] Phone: [phone]   Fax:  [phone]  Name: [name] MRN: [id number] Date of Birth: 1961/06/16

## 2020-06-22 ENCOUNTER — Ambulatory Visit: Payer: Medicaid Other | Admitting: Physical Therapy

## 2020-06-22 ENCOUNTER — Other Ambulatory Visit: Payer: Self-pay

## 2020-06-22 DIAGNOSIS — R2681 Unsteadiness on feet: Secondary | ICD-10-CM

## 2020-06-22 DIAGNOSIS — G8929 Other chronic pain: Secondary | ICD-10-CM

## 2020-06-22 DIAGNOSIS — R293 Abnormal posture: Secondary | ICD-10-CM | POA: Diagnosis not present

## 2020-06-22 DIAGNOSIS — R2689 Other abnormalities of gait and mobility: Secondary | ICD-10-CM

## 2020-06-22 DIAGNOSIS — M5441 Lumbago with sciatica, right side: Secondary | ICD-10-CM

## 2020-06-22 DIAGNOSIS — M6281 Muscle weakness (generalized): Secondary | ICD-10-CM

## 2020-06-22 NOTE — Therapy (Signed)
[name] Phone: [phone]   Fax:  [phone]  Physical Therapy Treatment  Patient Details  Name: [name] MRN: [id number] Date of Birth: 01/28/1961 Referring Provider ([name]): Juan Carina, FNP   Encounter Date: 06/22/2020   [name] End of Session - 06/22/20 0854    Visit Number 5    Number of Visits 13    Authorization Type medicaid healthy blue- 27 visits/year with Josem Kaufmann    [name] Start Time 0805   [name] arrived late   [name] Stop Time 0845    [name] Time Calculation (min) 40 min    Activity Tolerance Patient tolerated treatment well    Behavior During Therapy [facility] for tasks assessed/performed           Past Medical History:  Diagnosis Date  . Anemia   . Arthritis   . Back pain   . CAD (coronary artery disease)    a. s/p DES to LAD in 05/2016  . Cervical radiculopathy   . Chronic diastolic CHF (congestive heart failure) ([place])   . Chronic pain   . Depression   . DVT (deep venous thrombosis) ([place])   . Hematemesis   . Hepatic steatosis   . Hyperlipidemia   . Hypertension   . IBS (irritable bowel syndrome)   . Morbid obesity ([place])   . OSA (obstructive sleep apnea)   . Pancreatitis   . PE (pulmonary thromboembolism) ([place])   . Peripheral neuropathy   . PUD (peptic ulcer disease)   . Renal disorder   . Stroke [facility])    a. ?details unclear - not seen on imaging when he was admitted in 05/2017 for TIA symptoms which were felt due to cervical radiculopathy.  . Thoracic aortic ectasia (H[facility])    a. 4.3cm ectatic ascending thoracic aorta by CT 06/2017.   . Type 2 diabetes mellitus ([place])     Past Surgical History:  Procedure Laterality Date  . CARDIAC CATHETERIZATION N/A 05/31/2016   Procedure: Left Heart Cath and Coronary Angiography;  Surgeon: Peter M Martinique, MD;  Location: [place] [facility];  Service: Cardiovascular;  Laterality: N/A;  . CARDIAC CATHETERIZATION N/A 05/31/2016    Procedure: Intravascular Pressure Wire/FFR Study;  Surgeon: Peter M Martinique, MD;  Location: [place] [facility];  Service: Cardiovascular;  Laterality: N/A;  . CARDIAC CATHETERIZATION N/A 05/31/2016   Procedure: Coronary Stent Intervention;  Surgeon: Peter M Martinique, MD;  Location: [place] [facility];  Service: Cardiovascular;  Laterality: N/A;  . COLONOSCOPY WITH PROPOFOL N/A 04/29/2020   Procedure: COLONOSCOPY WITH PROPOFOL;  Surgeon: Wilford Corner, MD;  Location: [place];  Service: Endoscopy;  Laterality: N/A;  . ESOPHAGOGASTRODUODENOSCOPY N/A 04/29/2020   Procedure: ESOPHAGOGASTRODUODENOSCOPY (EGD);  Surgeon: Wilford Corner, MD;  Location: [place];  Service: Endoscopy;  Laterality: N/A;  . LEFT HEART CATH AND CORONARY ANGIOGRAPHY N/A 12/08/2017   Procedure: LEFT HEART CATH AND CORONARY ANGIOGRAPHY;  Surgeon: Leonie Man, MD;  Location: [place] [facility];  Service: Cardiovascular;  Laterality: N/A;  . LEFT HEART CATHETERIZATION WITH CORONARY ANGIOGRAM N/A 02/03/2014   Procedure: LEFT HEART CATHETERIZATION WITH CORONARY ANGIOGRAM;  Surgeon: Pixie Casino, MD;  Location: [facility] CATH LAB;  Service: Cardiovascular;  Laterality: N/A;  . left leg stent     . POLYPECTOMY  04/29/2020   Procedure: POLYPECTOMY;  Surgeon: Wilford Corner, MD;  Location: [facility] ENDOSCOPY;  Service: Endoscopy;;    There were no vitals filed for this  visit.   Subjective Assessment - 06/22/20 0807    Subjective Reports on Saturday that his hip and back pain got really bad after he got up. Mostly sleeps in his chair, can't tolerate laying down in bed. Has not had a chance to go to the gym.    Pertinent History neuropathy, obesity, recent GI bleed with transfusion, DM 2, 5 CVAs, atypical chest pain, HTN,    How long can you walk comfortably? <1 mile    Diagnostic tests 11/2016 lumbar x-rays: disc space flattening with mild anteriorspurring at T11-T12 level. Stable mild anterior spurring at L1-L2level. Stable moderate  anterior spurring at L3-L4 level. Mildanterior spurring upper endplate of L5. Stable mild disc spaceflattening at L5-S1 level. Mild facet degenerative changes at L4 andL5 level.    Patient Stated Goals [name] wants to be painfree    Currently in Pain? Yes    Pain Score 8    "mild pain in the back"   Pain Location Hip    Pain Orientation Right    Pain Descriptors / Indicators Aching;Dull    Pain Type Chronic pain    Pain Radiating Towards radiating down the side to the R knee    Pain Onset More than a month ago    Aggravating Factors  "everything makes it worse - sitting, standing, walking"    Pain Relieving Factors exercise makes it better    Pain Onset More than a month ago                             [facility] Adult [name] Treatment/Exercise - 06/22/20 0815      Ambulation/Gait   Ambulation/Gait Yes    Ambulation/Gait Assistance 5: Supervision    Ambulation/Gait Assistance Details performed 2 laps after [name] performed the SciFit, [name] reporting a decr in R hip pain during gait    Ambulation Distance (Feet) 230 Feet    Assistive device None    Gait Pattern Step-through pattern    Ambulation Surface Level;Indoor      Self-Care   Self-Care Other Self-Care Comments    Other Self-Care Comments  discussed importance of going to [place] to utilize aerobic exercise machines due to [name] having a decr in R hip pain during and afterwards. also discussed when [name] sleeping in bed on his side to put a pillow between his legs for improved alignment (as [name] only able to tolerate sleeping in bed for 2.5 hours before needing to move to his chair)      Exercises   Exercises Other Exercises    Other Exercises  seated R hamstring stretch 3 x 30 seconds - cues for proper technique, sitting up tall prior to hinging from hips vs. Performing with incr lumbar flexion. seated pelvic tilt x10 reps, with verbal and demo cues for technique with cues for incr core activation when in post pelvic tilt.   supine on incline with 2 pillows below head: lower trunk rotations x10 reps B- cues for gentle movement, marching with TA activation x10 reps, hooklying bent knee fall outs with TA activation x10 reps - cues for proper technique and breathing      Knee/Hip Exercises: Aerobic   Other Aerobic SciFit x 8 minutes on Level 2.0 with BLE only. HR afterwards: 102 bpm. [name] reporting a relief in R hip pain after performing (rates as 4/10, previously 8/10)  [name] Education - 06/22/20 0854    Education Details putting a pillow between knees for better alignment when sleeping sidelying in bed    Person(s) Educated Patient    Methods Explanation;Demonstration    Comprehension Verbalized understanding            [name] Short Term Goals - 06/08/20 1131      [name] SHORT TERM GOAL #1   Title [name] will be independent with initial HEP for strengthening and flexibility.    Baseline no current strengthening and flexiblity HEP. Doing cardio at gym.    Time 3    Period Weeks    Status New    Target Date 06/29/20      [name] SHORT TERM GOAL #2   Title [name] will be instructed in proper body mechanics with activities to protect back around home and be able to return demonstrate.    Baseline has not been instructed in proper body mechanics    Time 3    Period Weeks    Status New    Target Date 06/29/20             [name] Long Term Goals - 06/08/20 1134      [name] LONG TERM GOAL #1   Title [name] will be independent with progress HEP for strengthening, flexibility and aerobic exercise to continue with gym program.    Baseline currently only doing cardio at gym and limited by pain    Time 6    Period Weeks    Status New    Target Date 07/20/20      [name] LONG TERM GOAL #2   Title [name] will decrease 5 x sit to stand from 19.18 sec to <16 sec for improved functional strength and balance.    Baseline 19.18 sec from chair with arms on 06/08/20    Time 6    Period Weeks    Status New    Target Date 07/20/20       [name] LONG TERM GOAL #3   Title [name] will decrease Oswestry score from 44% to <35% for decreased self perceived disability due to back pain.    Baseline 44% on 06/08/20    Time 6    Period Weeks    Status New    Target Date 07/20/20      [name] LONG TERM GOAL #4   Title [name] will ambulate >6 min with <6/10 back pain for improved function.    Baseline 8-10/10 back pain currently. Reports only able to walk 3 min on [place]    Time 6    Period Weeks    Status New    Target Date 07/20/20                 Plan - 06/22/20 0858    Clinical Impression Statement [name] reporting improvement in pain with movement, core stabilization, and aerobic exercise. At start of session [name]'s pain was an 8/10 in the R hip and at the end, [name] reporting pain was a 3/10. Will continue to progress towards LTGs.    Personal Factors and Comorbidities Comorbidity 3+;Time since onset of injury/illness/exacerbation    Comorbidities neuropathy, obesity, recent GI bleed with transfusion, DM 2, 5 CVAs, atypical chest pain, HTN,    Examination-Activity Limitations Sleep;Stand;Locomotion Level;Transfers;Bed Mobility    Examination-Participation Restrictions Community Activity;Meal Prep;Yard Work    Stability/Clinical Decision Making Evolving/Moderate complexity    Rehab Potential Good    [name] Frequency 2x / week   plus eval  [name] Duration 6 weeks    [name] Treatment/Interventions ADLs/Self Care Home Management;Aquatic Therapy;Cryotherapy;Moist Heat;DME Instruction;Gait training;Stair training;Functional mobility training;Therapeutic activities;Therapeutic exercise;Patient/family education;Neuromuscular re-education;Balance training;Passive range of motion;Manual techniques;Spinal Manipulations;Joint Manipulations    [name] Next Visit Plan Right Hip/Lumbar Manual Therapy. SciFit for aerobic activity. Continue core stabilization and strengthening. Have we recieved OT referral yet?    Consulted and Agree with Plan of Care Patient             Patient will benefit from skilled therapeutic intervention in order to improve the following deficits and impairments:  Abnormal gait, Decreased range of motion, Decreased mobility, Decreased strength, Impaired flexibility, Pain, Improper body mechanics, Decreased balance, Decreased endurance  Visit Diagnosis: Other abnormalities of gait and mobility  Chronic right-sided low back pain with right-sided sciatica  Muscle weakness (generalized)  Abnormal posture  Unsteadiness on feet     Problem List Patient Active Problem List   Diagnosis Date Noted  . Upper GI bleed 04/28/2020  . Hypertensive urgency 04/27/2020  . Acute GI bleeding 04/27/2020  . Mixed diabetic hyperlipidemia associated with type 2 diabetes mellitus ([place]) 04/27/2020  . Chronic diastolic CHF (congestive heart failure) ([place]) 08/23/2019  . ARF (acute renal failure) ([place]) 08/01/2019  . Hyperkalemia 08/01/2019  . Uncontrolled type 2 diabetes mellitus with hyperglycemia ([place]) 08/01/2019  . AKI (acute kidney injury) ([place])   . Hypotension 07/31/2019  . DKA (diabetic ketoacidoses) ([place]) 05/05/2019  . Blurred vision 05/05/2019  . Alteration consciousness 11/19/2018  . Neck pain 10/09/2018  . Left arm weakness 10/09/2018  . B12 deficiency 08/10/2017  . Persistent headaches 08/09/2017  . GERD (gastroesophageal reflux disease) 06/29/2017  . Left arm numbness   . Cerebral embolism with cerebral infarction 06/17/2017  . TIA (transient ischemic attack) 06/17/2017  . Chronic back pain 12/29/2016  . Depression 12/15/2016  . Vitamin D deficiency 12/09/2016  . Right hip pain 12/07/2016  . Hypokalemia 12/07/2016  . Type 2 diabetes mellitus with vascular disease ([place]) 05/31/2016  . Normocytic normochromic anemia 05/31/2016  . Chest pain 05/31/2016  . Coronary artery disease involving native coronary artery of native heart without angina pectoris 01/06/2016  . DVT (deep venous thrombosis) ([place]) 01/06/2016  . Lactic  acidosis 05/28/2014  . Nonspecific chest pain 01/29/2014  . Uncontrolled secondary diabetes with peripheral neuropathy ([place]) 01/29/2014  . Obesity, Class III, BMI 40-49.9 (morbid obesity) ([place]) 01/29/2014  . Snoring 01/29/2014  . Dyslipidemia 01/29/2014  . HTN (hypertension) 01/29/2014  . Abnormal nuclear stress test 01/29/2014    [name], [name], [name]  06/22/2020, 9:04 AM  [facility] [address] [place], [place], [address] Phone: [phone]   Fax:  [phone]  Name: [name] MRN: [id number] Date of Birth: 1961/09/08

## 2020-06-25 ENCOUNTER — Ambulatory Visit: Payer: Medicaid Other | Attending: Family Medicine | Admitting: Physical Therapy

## 2020-06-25 ENCOUNTER — Other Ambulatory Visit: Payer: Self-pay

## 2020-06-25 DIAGNOSIS — R293 Abnormal posture: Secondary | ICD-10-CM

## 2020-06-25 DIAGNOSIS — M79642 Pain in left hand: Secondary | ICD-10-CM | POA: Insufficient documentation

## 2020-06-25 DIAGNOSIS — M25542 Pain in joints of left hand: Secondary | ICD-10-CM | POA: Diagnosis present

## 2020-06-25 DIAGNOSIS — R278 Other lack of coordination: Secondary | ICD-10-CM | POA: Insufficient documentation

## 2020-06-25 DIAGNOSIS — G8929 Other chronic pain: Secondary | ICD-10-CM

## 2020-06-25 DIAGNOSIS — R2689 Other abnormalities of gait and mobility: Secondary | ICD-10-CM | POA: Insufficient documentation

## 2020-06-25 DIAGNOSIS — M6281 Muscle weakness (generalized): Secondary | ICD-10-CM | POA: Diagnosis present

## 2020-06-25 DIAGNOSIS — R2681 Unsteadiness on feet: Secondary | ICD-10-CM | POA: Diagnosis present

## 2020-06-25 DIAGNOSIS — M5441 Lumbago with sciatica, right side: Secondary | ICD-10-CM | POA: Diagnosis present

## 2020-06-25 NOTE — Therapy (Signed)
[facility] [address] [place], [place], [address] Phone: [phone]   Fax:  [phone]  Physical Therapy Treatment  Patient Details  Name: [name] MRN: [id number] Date of Birth: [birth year] Referring Provider (PT): [name], FNP   Encounter Date: 06/25/2020   PT End of Session - 06/25/20 1438    Visit Number 6    Number of Visits 13    Authorization Type medicaid healthy blue- 27 visits/year with Josem Kaufmann    PT Start Time 1231    PT Stop Time 1314    PT Time Calculation (min) 43 min    Activity Tolerance Patient tolerated treatment well    Behavior During Therapy [facility] for tasks assessed/performed           Past Medical History:  Diagnosis Date  . Anemia   . Arthritis   . Back pain   . CAD (coronary artery disease)    a. s/p DES to LAD in 05/2016  . Cervical radiculopathy   . Chronic diastolic CHF (congestive heart failure) ([place])   . Chronic pain   . Depression   . DVT (deep venous thrombosis) ([place])   . Hematemesis   . Hepatic steatosis   . Hyperlipidemia   . Hypertension   . IBS (irritable bowel syndrome)   . Morbid obesity ([place])   . OSA (obstructive sleep apnea)   . Pancreatitis   . PE (pulmonary thromboembolism) ([place])   . Peripheral neuropathy   . PUD (peptic ulcer disease)   . Renal disorder   . Stroke [facility])    a. ?details unclear - not seen on imaging when he was admitted in 05/2017 for TIA symptoms which were felt due to cervical radiculopathy.  . Thoracic aortic ectasia (HCC)    a. 4.3cm ectatic ascending thoracic aorta by CT 06/2017.   . Type 2 diabetes mellitus ([place])     Past Surgical History:  Procedure Laterality Date  . CARDIAC CATHETERIZATION N/A 05/31/2016   Procedure: Left Heart Cath and Coronary Angiography;  Surgeon: Peter M Martinique, MD;  Location: [facility] CV [facility];  Service: Cardiovascular;  Laterality: N/A;  . CARDIAC CATHETERIZATION N/A 05/31/2016   Procedure:  Intravascular Pressure Wire/FFR Study;  Surgeon: Peter M Martinique, MD;  Location: [place] [facility];  Service: Cardiovascular;  Laterality: N/A;  . CARDIAC CATHETERIZATION N/A 05/31/2016   Procedure: Coronary Stent Intervention;  Surgeon: Peter M Martinique, MD;  Location: [place] [facility];  Service: Cardiovascular;  Laterality: N/A;  . COLONOSCOPY WITH PROPOFOL N/A 04/29/2020   Procedure: COLONOSCOPY WITH PROPOFOL;  Surgeon: Wilford Corner, MD;  Location: [place] [facility];  Service: Endoscopy;  Laterality: N/A;  . ESOPHAGOGASTRODUODENOSCOPY N/A 04/29/2020   Procedure: ESOPHAGOGASTRODUODENOSCOPY (EGD);  Surgeon: Wilford Corner, MD;  Location: [place];  Service: Endoscopy;  Laterality: N/A;  . LEFT HEART CATH AND CORONARY ANGIOGRAPHY N/A 12/08/2017   Procedure: LEFT HEART CATH AND CORONARY ANGIOGRAPHY;  Surgeon: Leonie Man, MD;  Location: [place] [facility];  Service: Cardiovascular;  Laterality: N/A;  . LEFT HEART CATHETERIZATION WITH CORONARY ANGIOGRAM N/A 02/03/2014   Procedure: LEFT HEART CATHETERIZATION WITH CORONARY ANGIOGRAM;  Surgeon: Pixie Casino, MD;  Location: [facility] CATH LAB;  Service: Cardiovascular;  Laterality: N/A;  . left leg stent     . POLYPECTOMY  04/29/2020   Procedure: POLYPECTOMY;  Surgeon: Wilford Corner, MD;  Location: [facility] ENDOSCOPY;  Service: Endoscopy;;    There were no vitals filed for this visit.   Subjective  Assessment - 06/25/20 1234    Subjective Had a fall the other day when playing Nerf gun with his grand son - fell forward. Tried sleeping with the pillow between his legs - it made his legs feel better but hurt his back. The hips and the legs are hurting today, back is feeling ok for now.    Pertinent History neuropathy, obesity, recent GI bleed with transfusion, DM 2, 5 CVAs, atypical chest pain, HTN,    How long can you walk comfortably? <1 mile    Diagnostic tests 11/2016 lumbar x-rays: disc space flattening with mild anteriorspurring at T11-T12 level.  Stable mild anterior spurring at L1-L2level. Stable moderate anterior spurring at L3-L4 level. Mildanterior spurring upper endplate of L5. Stable mild disc spaceflattening at L5-S1 level. Mild facet degenerative changes at L4 andL5 level.    Patient Stated Goals Pt wants to be painfree    Currently in Pain? Yes    Pain Score 7     Pain Location Hip    Pain Orientation Right    Pain Descriptors / Indicators Aching;Dull    Pain Type Chronic pain    Pain Onset More than a month ago    Pain Onset More than a month ago                             [facility] Adult PT Treatment/Exercise - 06/25/20 1241      Self-Care   Self-Care Other Self-Care Comments    Other Self-Care Comments  discussed importance of aerobic activity for pain management (going to [employer]) and throughout the day if pt is doing prolonged sitting to get up and move/go for a walk around the house every 30 minutes-1 hour      Exercises   Exercises Other Exercises    Other Exercises  standing repeated lumbar extension at countertop x10 reps (pt reporting no change in pain), for closed chain hip ABD strengthening at counter with red theraband around distal thighs pt performing LLE side stepping out and then back in x10 reps with pt reporting L low back pain/discomfort (cues for core activation throughout)      Knee/Hip Exercises: Aerobic   Other Aerobic SciFit x7 minutes with BLE only, at gear 2.0, pt reporting 3/10 pain after performing (previously was 7/10)             Access Code: [email or web address] URL: [email or web address] Date: 06/25/2020 Prepared by: Janann August  Reviewed pt's HEP below, with bolded being new additions. Pt has difficulties with supine exercises due to reflux and only performs them ocassionally   Exercises Seated Hamstring Stretch - 1 x daily - 7 x weekly - 1 sets - 3 reps - 30 hold - needs review for proper technqiue Seated Flexion Stretch with Swiss Ball - 1 x daily  - 7 x weekly - 1 sets - 10 reps - 15 hold - and rolling ball over to L and holding for incr R lateral trunk stretch, pt reports he is going to look into buying a physioball for home use Supine 90/90 Alternating Heel Touches with Posterior Pelvic Tilt - 1 x daily - 7 x weekly - 2 sets - 10 reps - 3 hold Supine Bridge - 1 x daily - 7 x weekly - 3 sets - 10 reps - 3 hold Mini Squat with Counter Support - 1 x daily - 7 x weekly - 1 sets - 10 reps -pt has not been  performing at home, reviewed proper technique with pt initially reporting B knee pain that decr with incr reps and cues for proper form  Seated Piriformis Stretch - 2 x daily - 7 x weekly - 3 sets - 30 hold Seated Marching with Opposite Shoulder Flexion - 1 x daily - 7 x weekly - 2 sets - 10 reps -for core activation, verbal and demo cues for technique Standing Lumbar Spine Flexion Stretch Counter - 1-2 x daily - 5 x weekly - 1-2 sets - 10 reps - and weight shifting forwards and bringing belly button towards counter with wider BOS        PT Education - 06/25/20 1437    Education Details reviewed HEP/made revisions as appropriate, scheduling for OT eval    Person(s) Educated Patient    Methods Explanation;Demonstration;Handout    Comprehension Verbalized understanding;Returned demonstration            PT Short Term Goals - 06/25/20 1238      PT SHORT TERM GOAL #1   Title Pt will be independent with initial HEP for strengthening and flexibility.    Baseline pt doing his exercises at home. has not had the chance to go to [employer]    Time 3    Period Weeks    Status Achieved    Target Date 06/29/20      PT SHORT TERM GOAL #2   Title Pt will be instructed in proper body mechanics with activities to protect back around home and be able to return demonstrate.    Baseline has not been instructed in proper body mechanics    Time 3    Period Weeks    Status New    Target Date 06/29/20             PT Long Term Goals -  06/08/20 1134      PT LONG TERM GOAL #1   Title Pt will be independent with progress HEP for strengthening, flexibility and aerobic exercise to continue with gym program.    Baseline currently only doing cardio at gym and limited by pain    Time 6    Period Weeks    Status New    Target Date 07/20/20      PT LONG TERM GOAL #2   Title Pt will decrease 5 x sit to stand from 19.18 sec to <16 sec for improved functional strength and balance.    Baseline 19.18 sec from chair with arms on 06/08/20    Time 6    Period Weeks    Status New    Target Date 07/20/20      PT LONG TERM GOAL #3   Title Pt will decrease Oswestry score from 44% to <35% for decreased self perceived disability due to back pain.    Baseline 44% on 06/08/20    Time 6    Period Weeks    Status New    Target Date 07/20/20      PT LONG TERM GOAL #4   Title Pt will ambulate >6 min with <6/10 back pain for improved function.    Baseline 8-10/10 back pain currently. Reports only able to walk 3 min on treaadmill    Time 6    Period Weeks    Status New    Target Date 07/20/20                 Plan - 06/25/20 1439    Clinical Impression Statement Began session  with SciFit with pt's pain decr to a 3/10 (beginning at a 7/10) in pt's RLE. Continued to discuss importance of aerobic exercise at [employer] and when at home trying to incr the time he is up and moving (every 30 mins-1 hr) when he is sitting for incr periods of time. Reviewed and upgraded pt's HEP as appropriate. Will continue to progress towards LTGs.    Personal Factors and Comorbidities Comorbidity 3+;Time since onset of injury/illness/exacerbation    Comorbidities neuropathy, obesity, recent GI bleed with transfusion, DM 2, 5 CVAs, atypical chest pain, HTN,    Examination-Activity Limitations Sleep;Stand;Locomotion Level;Transfers;Bed Mobility    Examination-Participation Restrictions Community Activity;Meal Prep;Yard Work    Copy Evolving/Moderate complexity    Rehab Potential Good    PT Frequency 2x / week   plus eval   PT Duration 6 weeks    PT Treatment/Interventions ADLs/Self Care Home Management;Aquatic Therapy;Cryotherapy;Moist Heat;DME Instruction;Gait training;Stair training;Functional mobility training;Therapeutic activities;Therapeutic exercise;Patient/family education;Neuromuscular re-education;Balance training;Passive range of motion;Manual techniques;Spinal Manipulations;Joint Manipulations    PT Next Visit Plan check remaining STG. Right Hip/Lumbar Manual Therapy. SciFit for aerobic activity. Continue core stabilization and strengthening.    Consulted and Agree with Plan of Care Patient           Patient will benefit from skilled therapeutic intervention in order to improve the following deficits and impairments:  Abnormal gait, Decreased range of motion, Decreased mobility, Decreased strength, Impaired flexibility, Pain, Improper body mechanics, Decreased balance, Decreased endurance  Visit Diagnosis: Other abnormalities of gait and mobility  Muscle weakness (generalized)  Unsteadiness on feet  Abnormal posture  Chronic right-sided low back pain with right-sided sciatica     Problem List Patient Active Problem List   Diagnosis Date Noted  . Upper GI bleed 04/28/2020  . Hypertensive urgency 04/27/2020  . Acute GI bleeding 04/27/2020  . Mixed diabetic hyperlipidemia associated with type 2 diabetes mellitus ([place]) 04/27/2020  . Chronic diastolic CHF (congestive heart failure) ([place]) 08/23/2019  . ARF (acute renal failure) ([place]) 08/01/2019  . Hyperkalemia 08/01/2019  . Uncontrolled type 2 diabetes mellitus with hyperglycemia ([place]) 08/01/2019  . AKI (acute kidney injury) ([place])   . Hypotension 07/31/2019  . DKA (diabetic ketoacidoses) ([employer]) 05/05/2019  . Blurred vision 05/05/2019  . Alteration consciousness 11/19/2018  . Neck pain 10/09/2018  . Left arm weakness 10/09/2018    . B12 deficiency 08/10/2017  . Persistent headaches 08/09/2017  . GERD (gastroesophageal reflux disease) 06/29/2017  . Left arm numbness   . Cerebral embolism with cerebral infarction 06/17/2017  . TIA (transient ischemic attack) 06/17/2017  . Chronic back pain 12/29/2016  . Depression 12/15/2016  . Vitamin D deficiency 12/09/2016  . Right hip pain 12/07/2016  . Hypokalemia 12/07/2016  . Type 2 diabetes mellitus with vascular disease ([place]) 05/31/2016  . Normocytic normochromic anemia 05/31/2016  . Chest pain 05/31/2016  . Coronary artery disease involving native coronary artery of native heart without angina pectoris 01/06/2016  . DVT (deep venous thrombosis) ([place]) 01/06/2016  . Lactic acidosis 05/28/2014  . Nonspecific chest pain 01/29/2014  . Uncontrolled secondary diabetes with peripheral neuropathy ([place]) 01/29/2014  . Obesity, Class III, BMI 40-49.9 (morbid obesity) ([place]) 01/29/2014  . Snoring 01/29/2014  . Dyslipidemia 01/29/2014  . HTN (hypertension) 01/29/2014  . Abnormal nuclear stress test 01/29/2014    Arliss Journey, PT, DPT  06/25/2020, 2:41 PM  [facility] [address] [place], [place], [address] Phone: [phone]   Fax:  [place]  Name: KRISTOFOR MICHALOWSKI MRN: [id number] Date of Birth: 1961/06/29

## 2020-06-25 NOTE — Patient Instructions (Signed)
Access Code: [email or web address] URL: [email or web address] Date: 06/25/2020 Prepared by: Janann August  Exercises Seated Hamstring Stretch - 1 x daily - 7 x weekly - 1 sets - 3 reps - 30 hold Seated Flexion Stretch with Swiss Ball - 1 x daily - 7 x weekly - 1 sets - 10 reps - 15 hold Supine 90/90 Alternating Heel Touches with Posterior Pelvic Tilt - 1 x daily - 7 x weekly - 2 sets - 10 reps - 3 hold Supine Bridge - 1 x daily - 7 x weekly - 3 sets - 10 reps - 3 hold Mini Squat with Counter Support - 1 x daily - 7 x weekly - 1 sets - 10 reps Seated Piriformis Stretch - 2 x daily - 7 x weekly - 3 sets - 30 hold Seated Marching with Opposite Shoulder Flexion - 1 x daily - 7 x weekly - 2 sets - 10 reps Standing Lumbar Spine Flexion Stretch Counter - 1-2 x daily - 5 x weekly - 1-2 sets - 10 reps

## 2020-06-30 ENCOUNTER — Ambulatory Visit: Payer: Medicaid Other

## 2020-06-30 ENCOUNTER — Other Ambulatory Visit: Payer: Self-pay

## 2020-06-30 DIAGNOSIS — R293 Abnormal posture: Secondary | ICD-10-CM

## 2020-06-30 DIAGNOSIS — M6281 Muscle weakness (generalized): Secondary | ICD-10-CM

## 2020-06-30 DIAGNOSIS — R2689 Other abnormalities of gait and mobility: Secondary | ICD-10-CM | POA: Diagnosis not present

## 2020-06-30 DIAGNOSIS — G8929 Other chronic pain: Secondary | ICD-10-CM

## 2020-06-30 DIAGNOSIS — R2681 Unsteadiness on feet: Secondary | ICD-10-CM

## 2020-06-30 NOTE — Patient Instructions (Signed)
Access Code: [email or web address] URL: [email or web address] Date: 06/30/2020 Prepared by: Baldomero Lamy  Exercises Seated Hamstring Stretch - 1 x daily - 7 x weekly - 1 sets - 3 reps - 30 hold Seated Flexion Stretch with Swiss Ball - 1 x daily - 7 x weekly - 1 sets - 10 reps - 15 hold Supine 90/90 Alternating Heel Touches with Posterior Pelvic Tilt - 1 x daily - 7 x weekly - 2 sets - 10 reps - 3 hold Supine Bridge - 1 x daily - 7 x weekly - 3 sets - 10 reps - 3 hold Mini Squat with Counter Support - 1 x daily - 7 x weekly - 1 sets - 10 reps Seated Piriformis Stretch - 2 x daily - 7 x weekly - 3 sets - 30 hold Seated Marching with Opposite Shoulder Flexion - 1 x daily - 7 x weekly - 2 sets - 10 reps Standing Lumbar Spine Flexion Stretch Counter - 1-2 x daily - 5 x weekly - 1-2 sets - 10 reps  Patient Education Biomedical scientist

## 2020-06-30 NOTE — Therapy (Signed)
[facility] [address] [place], [place], [address] Phone: [phone]   Fax:  [phone]  Physical Therapy Treatment  Patient Details  Name: [name] MRN: [id number] Date of Birth: 12/04/1960 Referring Provider (PT): [name], FNP   Encounter Date: 06/30/2020   PT End of Session - 06/30/20 1203    Visit Number 7    Number of Visits 13    Authorization Type medicaid healthy blue- 27 visits/year with Josem Kaufmann    PT Start Time 1146    PT Stop Time 1229    PT Time Calculation (min) 43 min    Activity Tolerance Patient tolerated treatment well    Behavior During Therapy [facility] for tasks assessed/performed           Past Medical History:  Diagnosis Date  . Anemia   . Arthritis   . Back pain   . CAD (coronary artery disease)    a. s/p DES to LAD in 05/2016  . Cervical radiculopathy   . Chronic diastolic CHF (congestive heart failure) ([place])   . Chronic pain   . Depression   . DVT (deep venous thrombosis) ([place])   . Hematemesis   . Hepatic steatosis   . Hyperlipidemia   . Hypertension   . IBS (irritable bowel syndrome)   . Morbid obesity ([place])   . OSA (obstructive sleep apnea)   . Pancreatitis   . PE (pulmonary thromboembolism) ([place])   . Peripheral neuropathy   . PUD (peptic ulcer disease)   . Renal disorder   . Stroke [facility])    a. ?details unclear - not seen on imaging when he was admitted in 05/2017 for TIA symptoms which were felt due to cervical radiculopathy.  . Thoracic aortic ectasia (H[facility])    a. 4.3cm ectatic ascending thoracic aorta by CT 06/2017.   . Type 2 diabetes mellitus ([place])     Past Surgical History:  Procedure Laterality Date  . CARDIAC CATHETERIZATION N/A 05/31/2016   Procedure: Left Heart Cath and Coronary Angiography;  Surgeon: Peter M Martinique, MD;  Location: [place] CV LAB;  Service: Cardiovascular;  Laterality: N/A;  . CARDIAC CATHETERIZATION N/A 05/31/2016   Procedure:  Intravascular Pressure Wire/FFR Study;  Surgeon: Peter M Martinique, MD;  Location: [place] CV LAB;  Service: Cardiovascular;  Laterality: N/A;  . CARDIAC CATHETERIZATION N/A 05/31/2016   Procedure: Coronary Stent Intervention;  Surgeon: Peter M Martinique, MD;  Location: [place] CV LAB;  Service: Cardiovascular;  Laterality: N/A;  . COLONOSCOPY WITH PROPOFOL N/A 04/29/2020   Procedure: COLONOSCOPY WITH PROPOFOL;  Surgeon: Wilford Corner, MD;  Location: [place];  Service: Endoscopy;  Laterality: N/A;  . ESOPHAGOGASTRODUODENOSCOPY N/A 04/29/2020   Procedure: ESOPHAGOGASTRODUODENOSCOPY (EGD);  Surgeon: Wilford Corner, MD;  Location: [place];  Service: Endoscopy;  Laterality: N/A;  . LEFT HEART CATH AND CORONARY ANGIOGRAPHY N/A 12/08/2017   Procedure: LEFT HEART CATH AND CORONARY ANGIOGRAPHY;  Surgeon: Leonie Man, MD;  Location: [place] CV LAB;  Service: Cardiovascular;  Laterality: N/A;  . LEFT HEART CATHETERIZATION WITH CORONARY ANGIOGRAM N/A 02/03/2014   Procedure: LEFT HEART CATHETERIZATION WITH CORONARY ANGIOGRAM;  Surgeon: Pixie Casino, MD;  Location: [facility] CATH LAB;  Service: Cardiovascular;  Laterality: N/A;  . left leg stent     . POLYPECTOMY  04/29/2020   Procedure: POLYPECTOMY;  Surgeon: Wilford Corner, MD;  Location: [facility] ENDOSCOPY;  Service: Endoscopy;;    There were no vitals filed for this visit.   Subjective  Assessment - 06/30/20 1151    Subjective Patient reports had some pain over the weekend in the back and hip. Some back pain today but placed a patch over hip and ointment so not having any pain there right now.    Pertinent History neuropathy, obesity, recent GI bleed with transfusion, DM 2, 5 CVAs, atypical chest pain, HTN,    How long can you walk comfortably? <1 mile    Diagnostic tests 11/2016 lumbar x-rays: disc space flattening with mild anteriorspurring at T11-T12 level. Stable mild anterior spurring at L1-L2level. Stable moderate anterior spurring at  L3-L4 level. Mildanterior spurring upper endplate of L5. Stable mild disc spaceflattening at L5-S1 level. Mild facet degenerative changes at L4 andL5 level.    Patient Stated Goals Pt wants to be painfree    Currently in Pain? Yes    Pain Score 6     Pain Location Back    Pain Orientation Lower    Pain Descriptors / Indicators Aching;Dull    Pain Type Chronic pain    Pain Onset More than a month ago    Pain Onset More than a month ago                             [facility] Adult PT Treatment/Exercise - 06/30/20 0001      Ambulation/Gait   Ambulation/Gait Yes    Ambulation/Gait Assistance 5: Supervision    Ambulation/Gait Assistance Details throughout therapy session with activities    Ambulation Distance (Feet) --   clinic distances   Assistive device None    Gait Pattern Step-through pattern    Ambulation Surface Level;Indoor      Self-Care   Self-Care Other Self-Care Comments    Other Self-Care Comments  Provided education and handout on lifting mechanics and posture. PT educating and demonstrating on proper ways to lift objects with patient returning demonstration. Also educating on body mechanics with daily tasks in home. See patient instructions for handout details.       Therapeutic Activites    Therapeutic Activities Other Therapeutic Activities    Other Therapeutic Activities Completed lifting of crate (with 5# weight) to work on improved lifting mechanics.       Exercises   Exercises Other Exercises;Knee/Hip    Other Exercises  seated on green air disc: completed alternating marching with opposite arm raise 2 x 10 reps bilaterally, verbal cues for core engagement with completion. Progressed to seated on green air disc completing alternating LAQ 2 x 10 reps. verbal cues for posture and core enagement with completion.       Knee/Hip Exercises: Aerobic   Other Aerobic SciFit x 9 minutes with BLE only, level 2.0. Patient reports no pain during completion of  aerobic activity.       Knee/Hip Exercises: Standing   Other Standing Knee Exercises In standing in // bars: completed resisted marching x 10 reps with blue theraband at thighs. Completed side stepping with blue theraband, 2 x 10 reps down and back in // bars. Verbal cues for posture with completion. Completed with light UE support and with theraband placed at thighs.                   PT Education - 06/30/20 1204    Education Details educated on [employer] (see patient instructions)    Person(s) Educated Patient    Methods Explanation;Demonstration;Handout    Comprehension Verbalized understanding;Returned demonstration  PT Short Term Goals - 06/30/20 1152      PT SHORT TERM GOAL #1   Title Pt will be independent with initial HEP for strengthening and flexibility.    Baseline pt doing his exercises at home. has not had the chance to go to [employer]    Time 3    Period Weeks    Status Achieved    Target Date 06/29/20      PT SHORT TERM GOAL #2   Title Pt will be instructed in proper body mechanics with activities to protect back around home and be able to return demonstrate.    Baseline educated on body mechanics, and able to demonstrate    Time 3    Period Weeks    Status Achieved    Target Date 06/29/20             PT Long Term Goals - 06/08/20 1134      PT LONG TERM GOAL #1   Title Pt will be independent with progress HEP for strengthening, flexibility and aerobic exercise to continue with gym program.    Baseline currently only doing cardio at gym and limited by pain    Time 6    Period Weeks    Status New    Target Date 07/20/20      PT LONG TERM GOAL #2   Title Pt will decrease 5 x sit to stand from 19.18 sec to <16 sec for improved functional strength and balance.    Baseline 19.18 sec from chair with arms on 06/08/20    Time 6    Period Weeks    Status New    Target Date 07/20/20      PT LONG TERM GOAL #3   Title Pt will  decrease Oswestry score from 44% to <35% for decreased self perceived disability due to back pain.    Baseline 44% on 06/08/20    Time 6    Period Weeks    Status New    Target Date 07/20/20      PT LONG TERM GOAL #4   Title Pt will ambulate >6 min with <6/10 back pain for improved function.    Baseline 8-10/10 back pain currently. Reports only able to walk 3 min on treaadmill    Time 6    Period Weeks    Status New    Target Date 07/20/20                 Plan - 06/30/20 1225    Clinical Impression Statement Today's skilled PT session included finishing assessment of patient's progress toward STG. Patient able to meet all STG's at this time. PT providing extensive education on body mechanics today to help further promote reductions in pain and improve tolerance for functional mobility. Continued BLE strengthening and core stabilization exercises today. Mild increase in pain, but after aerobic activity on SciFit, decreased pain reported. Patient will continue to benefit from skilled PT services to progress toward all goals.    Personal Factors and Comorbidities Comorbidity 3+;Time since onset of injury/illness/exacerbation    Comorbidities neuropathy, obesity, recent GI bleed with transfusion, DM 2, 5 CVAs, atypical chest pain, HTN,    Examination-Activity Limitations Sleep;Stand;Locomotion Level;Transfers;Bed Mobility    Examination-Participation Restrictions Community Activity;Meal Prep;Yard Work    Stability/Clinical Decision Making Evolving/Moderate complexity    Rehab Potential Good    PT Frequency 2x / week   plus eval   PT Duration 6 weeks  PT Treatment/Interventions ADLs/Self Care Home Management;Aquatic Therapy;Cryotherapy;Moist Heat;DME Instruction;Gait training;Stair training;Functional mobility training;Therapeutic activities;Therapeutic exercise;Patient/family education;Neuromuscular re-education;Balance training;Passive range of motion;Manual techniques;Spinal  Manipulations;Joint Manipulations    PT Next Visit Plan Right Hip/Lumbar Manual Therapy. SciFit for aerobic activity. Continue core stabilization and strengthening.    Consulted and Agree with Plan of Care Patient           Patient will benefit from skilled therapeutic intervention in order to improve the following deficits and impairments:  Abnormal gait, Decreased range of motion, Decreased mobility, Decreased strength, Impaired flexibility, Pain, Improper body mechanics, Decreased balance, Decreased endurance  Visit Diagnosis: Other abnormalities of gait and mobility  Muscle weakness (generalized)  Unsteadiness on feet  Abnormal posture  Chronic right-sided low back pain with right-sided sciatica     Problem List Patient Active Problem List   Diagnosis Date Noted  . Upper GI bleed 04/28/2020  . Hypertensive urgency 04/27/2020  . Acute GI bleeding 04/27/2020  . Mixed diabetic hyperlipidemia associated with type 2 diabetes mellitus ([place]) 04/27/2020  . Chronic diastolic CHF (congestive heart failure) ([place]) 08/23/2019  . ARF (acute renal failure) ([place]) 08/01/2019  . Hyperkalemia 08/01/2019  . Uncontrolled type 2 diabetes mellitus with hyperglycemia ([place]) 08/01/2019  . AKI (acute kidney injury) ([place])   . Hypotension 07/31/2019  . DKA (diabetic ketoacidoses) ([place]) 05/05/2019  . Blurred vision 05/05/2019  . Alteration consciousness 11/19/2018  . Neck pain 10/09/2018  . Left arm weakness 10/09/2018  . B12 deficiency 08/10/2017  . Persistent headaches 08/09/2017  . GERD (gastroesophageal reflux disease) 06/29/2017  . Left arm numbness   . Cerebral embolism with cerebral infarction 06/17/2017  . TIA (transient ischemic attack) 06/17/2017  . Chronic back pain 12/29/2016  . Depression 12/15/2016  . Vitamin D deficiency 12/09/2016  . Right hip pain 12/07/2016  . Hypokalemia 12/07/2016  . Type 2 diabetes mellitus with vascular disease ([place]) 05/31/2016  . Normocytic  normochromic anemia 05/31/2016  . Chest pain 05/31/2016  . Coronary artery disease involving native coronary artery of native heart without angina pectoris 01/06/2016  . DVT (deep venous thrombosis) ([place]) 01/06/2016  . Lactic acidosis 05/28/2014  . Nonspecific chest pain 01/29/2014  . Uncontrolled secondary diabetes with peripheral neuropathy ([place]) 01/29/2014  . Obesity, Class III, BMI 40-49.9 (morbid obesity) ([place]) 01/29/2014  . Snoring 01/29/2014  . Dyslipidemia 01/29/2014  . HTN (hypertension) 01/29/2014  . Abnormal nuclear stress test 01/29/2014    Jones Bales, PT, DPT 06/30/2020, 12:30 PM  [facility] [address] [place], [place], [address] Phone: [phone]   Fax:  [phone]  Name: [name] MRN: [id number] Date of Birth: 06/10/1961

## 2020-07-03 ENCOUNTER — Other Ambulatory Visit: Payer: Self-pay

## 2020-07-03 ENCOUNTER — Ambulatory Visit: Payer: Medicaid Other

## 2020-07-03 DIAGNOSIS — R2689 Other abnormalities of gait and mobility: Secondary | ICD-10-CM | POA: Diagnosis not present

## 2020-07-03 DIAGNOSIS — M6281 Muscle weakness (generalized): Secondary | ICD-10-CM

## 2020-07-03 DIAGNOSIS — G8929 Other chronic pain: Secondary | ICD-10-CM

## 2020-07-03 NOTE — Therapy (Signed)
[facility] [address] [place], [place], [address] Phone: [phone]   Fax:  [phone]  Physical Therapy Treatment  Patient Details  Name: [name] MRN: [id number] Date of Birth: 1961/04/09 Referring Provider (PT): [name], FNP   Encounter Date: 07/03/2020   PT End of Session - 07/03/20 1059    Visit Number 8    Number of Visits 13    Authorization Type medicaid healthy blue- 27 visits/year with Josem Kaufmann    PT Start Time 1016    PT Stop Time 1058    PT Time Calculation (min) 42 min    Activity Tolerance Patient tolerated treatment well    Behavior During Therapy [facility] for tasks assessed/performed           Past Medical History:  Diagnosis Date  . Anemia   . Arthritis   . Back pain   . CAD (coronary artery disease)    a. s/p DES to LAD in 05/2016  . Cervical radiculopathy   . Chronic diastolic CHF (congestive heart failure) ([place])   . Chronic pain   . Depression   . DVT (deep venous thrombosis) ([place])   . Hematemesis   . Hepatic steatosis   . Hyperlipidemia   . Hypertension   . IBS (irritable bowel syndrome)   . Morbid obesity ([place])   . OSA (obstructive sleep apnea)   . Pancreatitis   . PE (pulmonary thromboembolism) ([place])   . Peripheral neuropathy   . PUD (peptic ulcer disease)   . Renal disorder   . Stroke [facility])    a. ?details unclear - not seen on imaging when he was admitted in 05/2017 for TIA symptoms which were felt due to cervical radiculopathy.  . Thoracic aortic ectasia (HCC)    a. 4.3cm ectatic ascending thoracic aorta by CT 06/2017.   . Type 2 diabetes mellitus ([place])     Past Surgical History:  Procedure Laterality Date  . CARDIAC CATHETERIZATION N/A 05/31/2016   Procedure: Left Heart Cath and Coronary Angiography;  Surgeon: Peter M Martinique, MD;  Location: [place] [facility];  Service: Cardiovascular;  Laterality: N/A;  . CARDIAC CATHETERIZATION N/A 05/31/2016   Procedure:  Intravascular Pressure Wire/FFR Study;  Surgeon: Peter M Martinique, MD;  Location: [place] [facility];  Service: Cardiovascular;  Laterality: N/A;  . CARDIAC CATHETERIZATION N/A 05/31/2016   Procedure: Coronary Stent Intervention;  Surgeon: Peter M Martinique, MD;  Location: [place] [facility];  Service: Cardiovascular;  Laterality: N/A;  . COLONOSCOPY WITH PROPOFOL N/A 04/29/2020   Procedure: COLONOSCOPY WITH PROPOFOL;  Surgeon: Wilford Corner, MD;  Location: [place];  Service: Endoscopy;  Laterality: N/A;  . ESOPHAGOGASTRODUODENOSCOPY N/A 04/29/2020   Procedure: ESOPHAGOGASTRODUODENOSCOPY (EGD);  Surgeon: Wilford Corner, MD;  Location: [place];  Service: Endoscopy;  Laterality: N/A;  . LEFT HEART CATH AND CORONARY ANGIOGRAPHY N/A 12/08/2017   Procedure: LEFT HEART CATH AND CORONARY ANGIOGRAPHY;  Surgeon: Leonie Man, MD;  Location: [place] [facility];  Service: Cardiovascular;  Laterality: N/A;  . LEFT HEART CATHETERIZATION WITH CORONARY ANGIOGRAM N/A 02/03/2014   Procedure: LEFT HEART CATHETERIZATION WITH CORONARY ANGIOGRAM;  Surgeon: Pixie Casino, MD;  Location: [facility] CATH LAB;  Service: Cardiovascular;  Laterality: N/A;  . left leg stent     . POLYPECTOMY  04/29/2020   Procedure: POLYPECTOMY;  Surgeon: Wilford Corner, MD;  Location: [facility] ENDOSCOPY;  Service: Endoscopy;;    There were no vitals filed for this visit.   Subjective  Assessment - 07/03/20 1017    Subjective Pt reports that he continues to have a lot of bad days. Hard to get out of bed in the morning. Reports that he did ok after last session but when has to drive any distance pain comes back. Has been trying to change position as instructed. He did make it to the gym one day and had a great work out and felt good all day. Pt reports that sugar was 400 yesterday in morning and 470 last night. Blood sugar was 333 this morning and he took his insulin. He did get an injection yesterday in both hips.    Pertinent History  neuropathy, obesity, recent GI bleed with transfusion, DM 2, 5 CVAs, atypical chest pain, HTN,    How long can you walk comfortably? <1 mile    Diagnostic tests 11/2016 lumbar x-rays: disc space flattening with mild anteriorspurring at T11-T12 level. Stable mild anterior spurring at L1-L2level. Stable moderate anterior spurring at L3-L4 level. Mildanterior spurring upper endplate of L5. Stable mild disc spaceflattening at L5-S1 level. Mild facet degenerative changes at L4 andL5 level.    Patient Stated Goals Pt wants to be painfree    Currently in Pain? Yes    Pain Score 7     Pain Location Back    Pain Orientation Lower    Pain Descriptors / Indicators Aching;Dull    Pain Type Chronic pain    Pain Onset More than a month ago    Pain Frequency Constant    Pain Onset More than a month ago                             [facility] Adult PT Treatment/Exercise - 07/03/20 1023      Transfers   Transfers Sit to Stand;Stand to Sit    Sit to Stand 7: Independent    Stand to Sit 7: Independent      Ambulation/Gait   Ambulation/Gait Yes    Ambulation/Gait Assistance 5: Supervision    Ambulation/Gait Assistance Details Pt ambulated 3 min before stopping mostly due to SOB. He was just starting to get slight twinge in right hip but nothing bad.    Ambulation Distance (Feet) 460 Feet    Assistive device None    Gait Pattern Step-through pattern    Ambulation Surface Level;Indoor      Exercises   Exercises Other Exercises    Other Exercises  Standing mini-squat x 10 with raising 3.3# ball out front when lowers and then back down when rises. Stepping forward and opposite shoulder flexion with 3.3# ball x 10 each side. Pt reported feeling in knees some. Performed squat with holding ball close to chest and then raising overhead x 5. Had to stop as was getting a little dizzy looking up and down and winded. Reported some pain in low back with last squat activity. HR=90 after. Seated on green  disc: alternating hip flexion x 10 then adding in opposite arm/leg lifts x 10 then x 10 LAQ. Denies any pain with seated activities. Pt was cued to keep tummy tight throughout.      Knee/Hip Exercises: Aerobic   Other Aerobic Sci Fit x 10 min level 2.5 legs only.  Pt denied any pain with activity.                    PT Short Term Goals - 06/30/20 1152      PT SHORT  TERM GOAL #1   Title Pt will be independent with initial HEP for strengthening and flexibility.    Baseline pt doing his exercises at home. has not had the chance to go to [employer]    Time 3    Period Weeks    Status Achieved    Target Date 06/29/20      PT SHORT TERM GOAL #2   Title Pt will be instructed in proper body mechanics with activities to protect back around home and be able to return demonstrate.    Baseline educated on body mechanics, and able to demonstrate    Time 3    Period Weeks    Status Achieved    Target Date 06/29/20             PT Long Term Goals - 06/08/20 1134      PT LONG TERM GOAL #1   Title Pt will be independent with progress HEP for strengthening, flexibility and aerobic exercise to continue with gym program.    Baseline currently only doing cardio at gym and limited by pain    Time 6    Period Weeks    Status New    Target Date 07/20/20      PT LONG TERM GOAL #2   Title Pt will decrease 5 x sit to stand from 19.18 sec to <16 sec for improved functional strength and balance.    Baseline 19.18 sec from chair with arms on 06/08/20    Time 6    Period Weeks    Status New    Target Date 07/20/20      PT LONG TERM GOAL #3   Title Pt will decrease Oswestry score from 44% to <35% for decreased self perceived disability due to back pain.    Baseline 44% on 06/08/20    Time 6    Period Weeks    Status New    Target Date 07/20/20      PT LONG TERM GOAL #4   Title Pt will ambulate >6 min with <6/10 back pain for improved function.    Baseline 8-10/10 back pain  currently. Reports only able to walk 3 min on treaadmill    Time 6    Period Weeks    Status New    Target Date 07/20/20                 Plan - 07/03/20 1115    Clinical Impression Statement Pt continues to tolerate seated stepper well with no pain. He was more winded with standing activities today. SOB limited further gait time with only minimal pain in right hip after 3 min.    Personal Factors and Comorbidities Comorbidity 3+;Time since onset of injury/illness/exacerbation    Comorbidities neuropathy, obesity, recent GI bleed with transfusion, DM 2, 5 CVAs, atypical chest pain, HTN,    Examination-Activity Limitations Sleep;Stand;Locomotion Level;Transfers;Bed Mobility    Examination-Participation Restrictions Community Activity;Meal Prep;Yard Work    Merchant navy officer Evolving/Moderate complexity    Rehab Potential Good    PT Frequency 2x / week   plus eval   PT Duration 6 weeks    PT Treatment/Interventions ADLs/Self Care Home Management;Aquatic Therapy;Cryotherapy;Moist Heat;DME Instruction;Gait training;Stair training;Functional mobility training;Therapeutic activities;Therapeutic exercise;Patient/family education;Neuromuscular re-education;Balance training;Passive range of motion;Manual techniques;Spinal Manipulations;Joint Manipulations    PT Next Visit Plan How are exercises going? Has blood sugar improved? SciFit for aerobic activity. Continue core stabilization and strengthening.    Consulted and Agree with Plan of Care  Patient           Patient will benefit from skilled therapeutic intervention in order to improve the following deficits and impairments:  Abnormal gait, Decreased range of motion, Decreased mobility, Decreased strength, Impaired flexibility, Pain, Improper body mechanics, Decreased balance, Decreased endurance  Visit Diagnosis: Other abnormalities of gait and mobility  Muscle weakness (generalized)  Chronic right-sided low back pain  with right-sided sciatica     Problem List Patient Active Problem List   Diagnosis Date Noted  . Upper GI bleed 04/28/2020  . Hypertensive urgency 04/27/2020  . Acute GI bleeding 04/27/2020  . Mixed diabetic hyperlipidemia associated with type 2 diabetes mellitus ([place]) 04/27/2020  . Chronic diastolic CHF (congestive heart failure) ([place]) 08/23/2019  . ARF (acute renal failure) ([place]) 08/01/2019  . Hyperkalemia 08/01/2019  . Uncontrolled type 2 diabetes mellitus with hyperglycemia ([place]) 08/01/2019  . AKI (acute kidney injury) ([place])   . Hypotension 07/31/2019  . DKA (diabetic ketoacidoses) ([place]) 05/05/2019  . Blurred vision 05/05/2019  . Alteration consciousness 11/19/2018  . Neck pain 10/09/2018  . Left arm weakness 10/09/2018  . B12 deficiency 08/10/2017  . Persistent headaches 08/09/2017  . GERD (gastroesophageal reflux disease) 06/29/2017  . Left arm numbness   . Cerebral embolism with cerebral infarction 06/17/2017  . TIA (transient ischemic attack) 06/17/2017  . Chronic back pain 12/29/2016  . Depression 12/15/2016  . Vitamin D deficiency 12/09/2016  . Right hip pain 12/07/2016  . Hypokalemia 12/07/2016  . Type 2 diabetes mellitus with vascular disease ([place]) 05/31/2016  . Normocytic normochromic anemia 05/31/2016  . Chest pain 05/31/2016  . Coronary artery disease involving native coronary artery of native heart without angina pectoris 01/06/2016  . DVT (deep venous thrombosis) ([place]) 01/06/2016  . Lactic acidosis 05/28/2014  . Nonspecific chest pain 01/29/2014  . Uncontrolled secondary diabetes with peripheral neuropathy ([place]) 01/29/2014  . Obesity, Class III, BMI 40-49.9 (morbid obesity) ([place]) 01/29/2014  . Snoring 01/29/2014  . Dyslipidemia 01/29/2014  . HTN (hypertension) 01/29/2014  . Abnormal nuclear stress test 01/29/2014    Electa Sniff, PT, DPT, NCS 07/03/2020, 11:18 AM  [facility] [address]  [place], [place], [address] Phone: [phone]   Fax:  [phone]  Name: ALEXANDRO LINE MRN: [id number] Date of Birth: Apr 09, 1961

## 2020-07-06 ENCOUNTER — Encounter: Payer: Self-pay | Admitting: Occupational Therapy

## 2020-07-06 ENCOUNTER — Ambulatory Visit: Payer: Medicaid Other

## 2020-07-06 ENCOUNTER — Other Ambulatory Visit: Payer: Self-pay

## 2020-07-06 ENCOUNTER — Ambulatory Visit: Payer: Medicaid Other | Admitting: Occupational Therapy

## 2020-07-06 VITALS — BP 178/82 | HR 74

## 2020-07-06 DIAGNOSIS — R2689 Other abnormalities of gait and mobility: Secondary | ICD-10-CM | POA: Diagnosis not present

## 2020-07-06 DIAGNOSIS — M25542 Pain in joints of left hand: Secondary | ICD-10-CM

## 2020-07-06 DIAGNOSIS — M79642 Pain in left hand: Secondary | ICD-10-CM

## 2020-07-06 DIAGNOSIS — M5441 Lumbago with sciatica, right side: Secondary | ICD-10-CM

## 2020-07-06 DIAGNOSIS — M6281 Muscle weakness (generalized): Secondary | ICD-10-CM

## 2020-07-06 DIAGNOSIS — R278 Other lack of coordination: Secondary | ICD-10-CM

## 2020-07-06 DIAGNOSIS — G8929 Other chronic pain: Secondary | ICD-10-CM

## 2020-07-06 NOTE — Therapy (Signed)
[facility] [address] [place], [place], [address] Phone: [phone]   Fax:  [phone]  Physical Therapy Treatment  Patient Details  Name: [name] MRN: [id number] Date of Birth: 06/30/1961 Referring Provider (PT): Romana Hayes, FNP   Encounter Date: 07/06/2020   PT End of Session - 07/06/20 1019    Visit Number 9    Number of Visits 13    Authorization Type medicaid healthy blue- 27 visits/year with auth    PT Start Time 1017    PT Stop Time 1058    PT Time Calculation (min) 41 min    Activity Tolerance Patient tolerated treatment well    Behavior During Therapy WFL for tasks assessed/performed           Past Medical History:  Diagnosis Date  . Anemia   . Arthritis   . Back pain   . CAD (coronary artery disease)    a. s/p DES to LAD in 05/2016  . Cervical radiculopathy   . Chronic diastolic CHF (congestive heart failure) (H[facility])   . Chronic pain   . Depression   . DVT (deep venous thrombosis) (H[facility])   . Hematemesis   . Hepatic steatosis   . Hyperlipidemia   . Hypertension   . IBS (irritable bowel syndrome)   . Morbid obesity (HCC)   . OSA (obstructive sleep apnea)   . Pancreatitis   . PE (pulmonary thromboembolism) (HCC)   . Peripheral neuropathy   . PUD (peptic ulcer disease)   . Renal disorder   . Stroke (HCC)    a. ?details unclear - not seen on imaging when he was admitted in 05/2017 for TIA symptoms which were felt due to cervical radiculopathy.  . Thoracic aortic ectasia (HCC)    a. 4.3cm ectatic ascending thoracic aorta by CT 06/2017.   . Type 2 diabetes mellitus (HCC)     Past Surgical History:  Procedure Laterality Date  . CARDIAC CATHETERIZATION N/A 05/31/2016   Procedure: Left Heart Cath and Coronary Angiography;  Surgeon: Peter M Jordan, MD;  [facility]: [facility];  Service: Cardiovascular;  Laterality: N/A;  . CARDIAC CATHETERIZATION N/A 05/31/2016   Procedure:  Intravascular Pressure Wire/FFR Study;  Surgeon: Peter M Jordan, MD;  Location: [facility];  Service: Cardiovascular;  Laterality: N/A;  . CARDIAC CATHETERIZATION N/A 05/31/2016   Procedure: Coronary Stent Intervention;  Surgeon: Peter M Jordan, MD;  [facility]: [facility];  Service: Cardiovascular;  Laterality: N/A;  . COLONOSCOPY WITH PROPOFOL N/A 04/29/2020   Procedure: COLONOSCOPY WITH PROPOFOL;  Surgeon: Schooler, Vincent, MD;  Location: [facility] ENDOSCOPY;  Service: Endoscopy;  Laterality: N/A;  . ESOPHAGOGASTRODUODENOSCOPY N/A 04/29/2020   Procedure: ESOPHAGOGASTRODUODENOSCOPY (EGD);  Surgeon: Schooler, Vincent, MD;  Location: [facility]OS[facility];  Service: Endoscopy;  Laterality: N/A;  . LEFT HEART CATH AND CORONARY ANGIOGRAPHY N/A 12/08/2017   Procedure: LEFT HEART CATH AND CORONARY ANGIOGRAPHY;  Surgeon: Harding, David W, MD;  [facility];  Service: Cardiovascular;  Laterality: N/A;  . LEFT HEART CATHETERIZATION WITH CORONARY ANGIOGRAM N/A 02/03/2014   Procedure: LEFT HEART CATHETERIZATION WITH CORONARY ANGIOGRAM;  Surgeon: Kenneth C. Hilty, MD;  Location: [facility];  Service: Cardiovascular;  Laterality: N/A;  . left leg stent     . 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 Did a lot of moving things and set up and back was really sore. Has some patches on today. Reports blood sugars are coming back down. Has not seen any longer term response for steroid shot last week on pain. Only helped that day.    Pertinent History neuropathy, obesity, recent GI bleed with transfusion, DM 2, 5 CVAs, atypical chest pain, HTN,    How long can you walk comfortably? <1 mile    Diagnostic tests  11/2016 lumbar x-rays: disc space flattening with mild anteriorspurring at T11-T12 level. Stable mild anterior spurring at L1-L2level. Stable moderate anterior spurring at L3-L4 level. Mildanterior spurring upper endplate of L5. Stable mild disc spaceflattening at L5-S1 level. Mild facet degenerative changes at L4 andL5 level.    Patient Stated Goals Pt wants to be painfree    Currently in Pain? Yes    Pain Score 7     Pain Location Back    Pain Orientation Lower    Pain Descriptors / Indicators Aching;Dull    Pain Type Chronic pain    Pain Radiating Towards Down right leg    Pain Onset More than a month ago    Pain Onset More than a month ago                             [facility] Adult PT Treatment/Exercise - 07/06/20 1022      Transfers   Transfers Sit to Stand;Stand to Sit    Sit to Stand 7: Independent    Five time sit to stand comments  10 sec from chair with hands    Stand to Sit 7: Independent      Ambulation/Gait   Ambulation/Gait Yes    Ambulation/Gait Assistance 5: Supervision    Ambulation/Gait Assistance Details HR=97 and O2 sat=95%. Pt reported 7/10 pain in low back and right hip increasing as went on. Noted slight increased lateral sway.  Pt ambulated for 2 min 42 sec total.    Ambulation Distance (Feet) 460 Feet    Assistive device None    Gait Pattern Step-through pattern    Ambulation Surface Level;Indoor    Gait Comments Gait on treadmill x 4 min at 1.75mh at 4% incline. Pain in back up to 7/10 at this point but took longer to come on at incline. Pain started to calm down some after sitting a couple minutes.      Exercises   Exercises Other Exercises    Other Exercises  Seated on green dynadisc: marching x 10 with  TA contraction, then opposite arm/leg lifts x 10 with 3# arm weights, LAQ x 10 bilateral with holding 3# weights in front. Verbal cues to keep tummy tight throughout. Sit to stand x 10 from dynadisc in chair with holding 3# weights focuing  on core contraction.                  PT Education - 07/06/20 1238    Education Details Added to core stab. Discussed dropping down to 1x/week    Person(s) Educated Patient    Methods Explanation    Comprehension Verbalized understanding            PT Short Term Goals - 06/30/20 1152      PT SHORT  TERM GOAL #1   Title Pt will be independent with initial HEP for strengthening and flexibility.    Baseline pt doing his exercises at home. has not had the chance to go to [employer]    Time 3    Period Weeks    Status Achieved    Target Date 06/29/20      PT SHORT TERM GOAL #2   Title Pt will be instructed in proper body mechanics with activities to protect back around home and be able to return demonstrate.    Baseline educated on body mechanics, and able to demonstrate    Time 3    Period Weeks    Status Achieved    Target Date 06/29/20             PT Long Term Goals - 07/06/20 1035      PT LONG TERM GOAL #1   Title Pt will be independent with progress HEP for strengthening, flexibility and aerobic exercise to continue with gym program.    Baseline currently only doing cardio at gym and limited by pain    Time 6    Period Weeks    Status New      PT LONG TERM GOAL #2   Title Pt will decrease 5 x sit to stand from 19.18 sec to <16 sec for improved functional strength and balance.    Baseline 19.18 sec from chair with arms on 06/08/20, 07/06/20 10 sec    Time 6    Period Weeks    Status Achieved      PT LONG TERM GOAL #3   Title Pt will decrease Oswestry score from 44% to <35% for decreased self perceived disability due to back pain.    Baseline 44% on 06/08/20    Time 6    Period Weeks    Status New      PT LONG TERM GOAL #4   Title Pt will ambulate >6 min with <6/10 back pain for improved function.    Baseline 8-10/10 back pain currently. Reports only able to walk 3 min on treaadmill. 07/06/20 2 min 42 sec overground with 7/10 pain in back    Time 6     Period Weeks    Status Not Met                 Plan - 07/06/20 1239    Clinical Impression Statement Pt met 5 x sit to stand goal today. Continues to have back pain with any longer gait distances. Pain came on later with walking on incline on treadmill supporting stenosis.    Personal Factors and Comorbidities Comorbidity 3+;Time since onset of injury/illness/exacerbation    Comorbidities neuropathy, obesity, recent GI bleed with transfusion, DM 2, 5 CVAs, atypical chest pain, HTN,    Examination-Activity Limitations Sleep;Stand;Locomotion Level;Transfers;Bed Mobility    Examination-Participation Restrictions Community Activity;Meal Prep;Yard Work    Stability/Clinical Decision Making Evolving/Moderate complexity    Rehab Potential Good    PT Frequency 2x / week   plus eval   PT Duration 6 weeks    PT Treatment/Interventions ADLs/Self Care Home Management;Aquatic Therapy;Cryotherapy;Moist Heat;DME Instruction;Gait training;Stair training;Functional mobility training;Therapeutic activities;Therapeutic exercise;Patient/family education;Neuromuscular re-education;Balance training;Passive range of motion;Manual techniques;Spinal Manipulations;Joint Manipulations    PT Next Visit Plan SciFit for aerobic activity. Continue core stabilization  and strengthening to finalize HEP    Consulted and Agree with Plan of Care Patient           Patient will benefit from   skilled therapeutic intervention in order to improve the following deficits and impairments:  Abnormal gait, Decreased range of motion, Decreased mobility, Decreased strength, Impaired flexibility, Pain, Improper body mechanics, Decreased balance, Decreased endurance  Visit Diagnosis: Other abnormalities of gait and mobility  Muscle weakness (generalized)  Chronic right-sided low back pain with right-sided sciatica     Problem List Patient Active Problem List   Diagnosis Date Noted  . Upper GI bleed 04/28/2020  .  Hypertensive urgency 04/27/2020  . Acute GI bleeding 04/27/2020  . Mixed diabetic hyperlipidemia associated with type 2 diabetes mellitus (HCC) 04/27/2020  . Chronic diastolic CHF (congestive heart failure) (HCC) 08/23/2019  . ARF (acute renal failure) (HCC) 08/01/2019  . Hyperkalemia 08/01/2019  . Uncontrolled type 2 diabetes mellitus with hyperglycemia (HCC) 08/01/2019  . AKI (acute kidney injury) (HCC)   . Hypotension 07/31/2019  . DKA (diabetic ketoacidoses) (HCC) 05/05/2019  . Blurred vision 05/05/2019  . Alteration consciousness 11/19/2018  . Neck pain 10/09/2018  . Left arm weakness 10/09/2018  . B12 deficiency 08/10/2017  . Persistent headaches 08/09/2017  . GERD (gastroesophageal reflux disease) 06/29/2017  . Left arm numbness   . Cerebral embolism with cerebral infarction 06/17/2017  . TIA (transient ischemic attack) 06/17/2017  . Chronic back pain 12/29/2016  . Depression 12/15/2016  . Vitamin D deficiency 12/09/2016  . Right hip pain 12/07/2016  . Hypokalemia 12/07/2016  . Type 2 diabetes mellitus with vascular disease (HCC) 05/31/2016  . Normocytic normochromic anemia 05/31/2016  . Chest pain 05/31/2016  . Coronary artery disease involving native coronary artery of native heart without angina pectoris 01/06/2016  . DVT (deep venous thrombosis) (HCC) 01/06/2016  . Lactic acidosis 05/28/2014  . Nonspecific chest pain 01/29/2014  . Uncontrolled secondary diabetes with peripheral neuropathy (HCC) 01/29/2014  . Obesity, Class III, BMI 40-49.9 (morbid obesity) (HCC) 01/29/2014  . Snoring 01/29/2014  . Dyslipidemia 01/29/2014  . HTN (hypertension) 01/29/2014  . Abnormal nuclear stress test 01/29/2014     A , PT, DPT, NCS 07/06/2020, 12:41 PM  [facility] [address] [place], [place], [address] Phone: [phone]   Fax:  [phone]  Name: [name] MRN: [id number] Date of Birth:  10/04/1961   

## 2020-07-06 NOTE — Therapy (Signed)
[facility] [address] [place], [place], [address] Phone: [phone]   Fax:  [phone]  Occupational Therapy Evaluation  Patient Details  Name: [name] MRN: [id number] Date of Birth: [birth year] Referring Provider (OT): [name]   Encounter Date: 07/06/2020   OT End of Session - 07/06/20 1152    Visit Number 1    Number of Visits 25    Date for OT Re-Evaluation 08/17/20    Authorization Type Medicaid Blue - 27 ot/pt/st    Authorization Time Period 27 Visits between OT/PT/ST    Authorization - Visit Number 1    Authorization - Number of Visits 10    Progress Note Due on Visit 10    OT Start Time 1101    OT Stop Time 1146    OT Time Calculation (min) 45 min    Activity Tolerance Patient tolerated treatment well    Behavior During Therapy [facility] for tasks assessed/performed           Past Medical History:  Diagnosis Date  . Anemia   . Arthritis   . Back pain   . CAD (coronary artery disease)    a. s/p DES to LAD in 05/2016  . Cervical radiculopathy   . Chronic diastolic CHF (congestive heart failure) ([place])   . Chronic pain   . Depression   . DVT (deep venous thrombosis) ([place])   . Hematemesis   . Hepatic steatosis   . Hyperlipidemia   . Hypertension   . IBS (irritable bowel syndrome)   . Morbid obesity ([place])   . OSA (obstructive sleep apnea)   . Pancreatitis   . PE (pulmonary thromboembolism) ([place])   . Peripheral neuropathy   . PUD (peptic ulcer disease)   . Renal disorder   . Stroke [facility])    a. ?details unclear - not seen on imaging when he was admitted in 05/2017 for TIA symptoms which were felt due to cervical radiculopathy.  . Thoracic aortic ectasia (H[facility])    a. 4.3cm ectatic ascending thoracic aorta by CT 06/2017.   . Type 2 diabetes mellitus ([place])     Past Surgical History:  Procedure Laterality Date  . CARDIAC CATHETERIZATION N/A 05/31/2016   Procedure: Left Heart Cath and  Coronary Angiography;  Surgeon: Peter M Martinique, MD;  Location: [facility];  Service: Cardiovascular;  Laterality: N/A;  . CARDIAC CATHETERIZATION N/A 05/31/2016   Procedure: Intravascular Pressure Wire/FFR Study;  Surgeon: Peter M Martinique, MD;  Location: [facility];  Service: Cardiovascular;  Laterality: N/A;  . CARDIAC CATHETERIZATION N/A 05/31/2016   Procedure: Coronary Stent Intervention;  Surgeon: Peter M Martinique, MD;  Location: [place];  Service: Cardiovascular;  Laterality: N/A;  . COLONOSCOPY WITH PROPOFOL N/A 04/29/2020   Procedure: COLONOSCOPY WITH PROPOFOL;  Surgeon: Wilford Corner, MD;  Location: [facility];  Service: Endoscopy;  Laterality: N/A;  . ESOPHAGOGASTRODUODENOSCOPY N/A 04/29/2020   Procedure: ESOPHAGOGASTRODUODENOSCOPY (EGD);  Surgeon: Wilford Corner, MD;  Location: [facility];  Service: Endoscopy;  Laterality: N/A;  . LEFT HEART CATH AND CORONARY ANGIOGRAPHY N/A 12/08/2017   Procedure: LEFT HEART CATH AND CORONARY ANGIOGRAPHY;  Surgeon: Leonie Man, MD;  Location: [facility];  Service: Cardiovascular;  Laterality: N/A;  . LEFT HEART CATHETERIZATION WITH CORONARY ANGIOGRAM N/A 02/03/2014   Procedure: LEFT HEART CATHETERIZATION WITH CORONARY ANGIOGRAM;  Surgeon: Pixie Casino, MD;  Location: [facility] CATH LAB;  Service: Cardiovascular;  Laterality: N/A;  .  left leg stent     . POLYPECTOMY  04/29/2020   Procedure: POLYPECTOMY;  Surgeon: Wilford Corner, MD;  Location: [facility] ENDOSCOPY;  Service: Endoscopy;;    There were no vitals filed for this visit.   Subjective Assessment - 07/06/20 1101    Subjective  Pt reports "I have seen better days, but I guess I am alright". Pt reports pain everywhere.    Pertinent History PMH: coronary artery disease, diabetes mellitus type 2 (insulin dependent), diastolic congestive heart failure, depression, hypertension, obesity, neuropathy, recent GI bleed with transfusion, atypical chest pain, 5 CVAs     Limitations fall risk    Patient Stated Goals Patient stated goals to "get feeling back in my hand and not hurting all the time"    Currently in Pain? Yes    Pain Score 7     Pain Location Arm    Pain Orientation Left    Pain Descriptors / Indicators Aching;Dull    Pain Type Chronic pain    Pain Onset More than a month ago    Pain Frequency Constant    Aggravating Factors  "everything makes it worse"    Pain Relieving Factors oxycodone and some exercise makes pain better    Pain Score 7    Pain Location Back    Pain Orientation Lower    Pain Descriptors / Indicators Dull;Aching    Pain Type Chronic pain    Pain Onset More than a month ago    Pain Frequency Constant    Aggravating Factors  everything    Pain Relieving Factors medication             [facility] OT Assessment - 07/06/20 0001      Assessment   Medical Diagnosis Left Hand Pain    Referring Provider (OT) [name]    Hand Dominance Right    Prior Therapy not for LUE      Precautions   Required Braces or Orthoses --   carpal tunnel brace does not currently wear     Balance Screen   Has the patient fallen in the past 6 months Yes    How many times? 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, exercise and go to the gym      ADL   Eating/Feeding Modified independent   some days difficulty to cut food due to L hand   Grooming Modified independent    Upper Body Bathing  Minimal assistance   CNA washes back   Lower Body Bathing Modified independent    Upper Body Dressing Needs assist for fasteners;Increased time    Lower Body Dressing Modified independent;Increased time;Needs assist for fasteners    Toilet Transfer Modified independent   pt reports toilet seat is low   Toileting - Clothing Manipulation Increased  time    Toileting -  Training and development officer    ADL comments Pt has a CNA that comes in once a day except Sundays to assist with ADLs and IADLs      IADL   Shopping Shops independently for small purchases    Light Housekeeping Needs help with all home maintenance tasks    Meal Prep Plans, prepares and serves adequate meals independently   cooks sitting down in [place] own vehicle    Medication Management Takes responsibility if medication is prepared in advance in seperate dosage   uses system for managing meds   Financial Management Manages financial matters independently (budgets, writes checks, pays rent, bills goes to bank), collects and keeps track of income      Mobility   Mobility Status History of falls      Vision - History   Baseline Vision Other (comment)    Additional Comments has glasses for driving and glasses for reading and watching TV. denies any changes to vision and reports glasses are working      Activity Tolerance   Activity Tolerance Tolerates < min activity, no significant change in vital signs    Activity Tolerance Comments due to pain cannot sit or stand for a long period of time      Sensation   Light Touch Impaired Detail    Additional Comments Pt reports LUE gets numb and tingling throughout the day in the hand median nerve innervation      Coordination   9 Hole Peg Test Right;Left    Right 9 Hole Peg Test 28    Left 9 Hole Peg Test 30.09    Box and Blocks LUE 47 RUE 43    Other 3  Button Test - 56.88      Edema   Edema --   Pt reports some increased edema BUE in mornings     ROM / Strength   AROM / PROM / Strength AROM      AROM   Overall AROM  Within functional limits for tasks performed    AROM Assessment Site Wrist    Right/Left Wrist Left    Left Wrist Extension 40 Degrees    Left Wrist Flexion 50 Degrees      Hand Function   Right Hand Grip (lbs) 73.8 lbs    Right Hand Lateral Pinch 17 lbs    Right Hand 3 Point Pinch 14 lbs    Left Hand Grip (lbs) 73.8 lbs    Left Hand Lateral Pinch 17 lbs    Left 3 point pinch 15 lbs                OT Short Term Goals - 07/06/20 1202      OT SHORT TERM GOAL #1   Title Pt will be independent with HEP 08/03/2020    Time 4    Period Weeks    Status New    Target Date 08/03/20      OT SHORT TERM GOAL #2   Title Pt will improve grip strength by 5 lbs in LUE in order to increase independence and ability to complete ADLs and IADLs    Baseline LUE 73.8 lbs    Time 4    Period Weeks    Status New      OT SHORT TERM GOAL #3   Title Pt  will improve functional use of LUE as evidenced by performing 3 button test in 45 seconds or less.    Time 4    Period Weeks    Status New      OT SHORT TERM GOAL #4   Title Patient will report pain no more than 5/10 with functional use of LUE    Time 4    Period Weeks    Status New      OT SHORT TERM GOAL #5   Title Pt will improve lateral pinch strength by 3 lbs in order to increase ability to manage fasteners with clothing management.    Baseline LUE 17 lbs    Time 4    Period Weeks    Status New             OT Long Term Goals - 07/06/20 1215      OT LONG TERM GOAL #1   Title Pt will be indepedent with updated HEP 08/31/20    Time 6    Period Weeks    Status New    Target Date 08/31/20      OT LONG TERM GOAL #2   Title Pt will improve grip strength by 10 lbs in LUE in order to increase independence and ability to complete ADLs and IADLs    Time 6     Period Weeks    Status New      OT LONG TERM GOAL #3   Title Pt will improve functional use of LUE as evidenced by performing 3 button test in 35 seconds or less.    Time 6    Period Weeks    Status New      OT LONG TERM GOAL #4   Title Patient will report pain no more than 3/10 with functional use of LUE    Time 6    Period Weeks    Status New      OT LONG TERM GOAL #5   Title Patient will report understanding and compliance of orthosis/splint PRN and/or verbalize understanding of proper body mechanics for wrist neutralization.    Time 6    Period Weeks    Status New      OT LONG TERM GOAL #6   Title Pt will improve lateral pinch strength by 6 lbs in order to increase ability to manage fasteners with clothing management.    Baseline LUE 17 lbs    Time 6    Period Weeks    Status New                 Plan - 07/06/20 1154    Clinical Impression Statement Pt referred to outpatient OT to address left hand pain.  Pt reports he had carpal tunnel surgery in March of this year.  Pt has had injections and they do help with back pain. Last one was 3-4 months ago. Pt demonstrates significant pain in left hand, 2/10 with activity and 8/10 at rest. Pt also demonstrates limited range of motion with LUE, fine motor coordination, decreased strength and impaired balance and mobility impeding ability to complete ADLs and IADLs safely. Skilled occupational therapy is recommended to target listed areas in order to maximize indepedence, decrease pain and increase safety.    OT Occupational Profile and History Detailed Assessment- Review of Records and additional review of physical, cognitive, psychosocial history related to current functional performance    Occupational performance deficits (Please refer to evaluation for details): IADL's;ADL's;Leisure;Rest and Sleep;Social Participation  Body Structure / Function / Physical Skills ADL;Coordination;FMC;Strength;ROM;Body  mechanics;IADL;Pain;Sensation;UE functional use;GMC    Rehab Potential Good    Clinical Decision Making Limited treatment options, no task modification necessary    Comorbidities Affecting Occupational Performance: May have comorbidities impacting occupational performance    Modification or Assistance to Complete Evaluation  No modification of tasks or assist necessary to complete eval    OT Frequency 2x / week    OT Duration 6 weeks   or 12 visits + eval over extended time   OT Treatment/Interventions Therapeutic exercise;Self-care/ADL training;Therapeutic activities;Splinting;Ultrasound;Electrical Stimulation;Patient/family education;Moist Heat;Fluidtherapy;DME and/or AE instruction;Manual Therapy;Passive range of motion    Plan Issue HEP with theraputty or coordination exercises    Consulted and Agree with Plan of Care Patient           Patient will benefit from skilled therapeutic intervention in order to improve the following deficits and impairments:   Body Structure / Function / Physical Skills: ADL, Coordination, FMC, Strength, ROM, Body mechanics, IADL, Pain, Sensation, UE functional use, GMC       Visit Diagnosis: Pain in left hand - Plan: Ot plan of care cert/re-cert  Pain in joint of left hand - Plan: Ot plan of care cert/re-cert  Muscle weakness (generalized) - Plan: Ot plan of care cert/re-cert  Other lack of coordination - Plan: Ot plan of care cert/re-cert    Problem List Patient Active Problem List   Diagnosis Date Noted  . Upper GI bleed 04/28/2020  . Hypertensive urgency 04/27/2020  . Acute GI bleeding 04/27/2020  . Mixed diabetic hyperlipidemia associated with type 2 diabetes mellitus ([place]) 04/27/2020  . Chronic diastolic CHF (congestive heart failure) ([place]) 08/23/2019  . ARF (acute renal failure) ([place]) 08/01/2019  . Hyperkalemia 08/01/2019  . Uncontrolled type 2 diabetes mellitus with hyperglycemia ([place]) 08/01/2019  . AKI (acute kidney injury) ([place])     . Hypotension 07/31/2019  . DKA (diabetic ketoacidoses) ([place]) 05/05/2019  . Blurred vision 05/05/2019  . Alteration consciousness 11/19/2018  . Neck pain 10/09/2018  . Left arm weakness 10/09/2018  . B12 deficiency 08/10/2017  . Persistent headaches 08/09/2017  . GERD (gastroesophageal reflux disease) 06/29/2017  . Left arm numbness   . Cerebral embolism with cerebral infarction 06/17/2017  . TIA (transient ischemic attack) 06/17/2017  . Chronic back pain 12/29/2016  . Depression 12/15/2016  . Vitamin D deficiency 12/09/2016  . Right hip pain 12/07/2016  . Hypokalemia 12/07/2016  . Type 2 diabetes mellitus with vascular disease ([place]) 05/31/2016  . Normocytic normochromic anemia 05/31/2016  . Chest pain 05/31/2016  . Coronary artery disease involving native coronary artery of native heart without angina pectoris 01/06/2016  . DVT (deep venous thrombosis) ([place]) 01/06/2016  . Lactic acidosis 05/28/2014  . Nonspecific chest pain 01/29/2014  . Uncontrolled secondary diabetes with peripheral neuropathy ([place]) 01/29/2014  . Obesity, Class III, BMI 40-49.9 (morbid obesity) ([place]) 01/29/2014  . Snoring 01/29/2014  . Dyslipidemia 01/29/2014  . HTN (hypertension) 01/29/2014  . Abnormal nuclear stress test 01/29/2014    Zachery Conch MOT, OTR/L  07/06/2020, 12:40 PM  [facility] [address] [place], [place], [address] Phone: [phone]   Fax:  [phone]  Name: [name] MRN: [id number] Date of Birth: 16-Dec-1960

## 2020-07-06 NOTE — Patient Instructions (Signed)
Access Code: [email or web address] URL: [email or web address] Date: 07/06/2020 Prepared by: Cherly Anderson  Exercises Seated Hamstring Stretch - 1 x daily - 7 x weekly - 1 sets - 3 reps - 30 hold Seated Flexion Stretch with Swiss Ball - 1 x daily - 7 x weekly - 1 sets - 10 reps - 15 hold Supine 90/90 Alternating Heel Touches with Posterior Pelvic Tilt - 1 x daily - 7 x weekly - 2 sets - 10 reps - 3 hold Supine Bridge - 1 x daily - 7 x weekly - 3 sets - 10 reps - 3 hold Mini Squat with Counter Support - 1 x daily - 7 x weekly - 1 sets - 10 reps Seated Piriformis Stretch - 2 x daily - 7 x weekly - 3 sets - 30 hold Seated Marching with Opposite Shoulder Flexion - 1 x daily - 7 x weekly - 2 sets - 10 reps Standing Lumbar Spine Flexion Stretch Counter - 1-2 x daily - 5 x weekly - 1-2 sets - 10 reps Seated Long Arc Quad - 1 x daily - 5 x weekly - 2 sets - 10 reps  Patient Education Biomedical scientist

## 2020-07-10 ENCOUNTER — Ambulatory Visit: Payer: Medicaid Other

## 2020-07-13 ENCOUNTER — Other Ambulatory Visit: Payer: Self-pay

## 2020-07-13 ENCOUNTER — Ambulatory Visit: Payer: Medicaid Other

## 2020-07-13 DIAGNOSIS — G8929 Other chronic pain: Secondary | ICD-10-CM

## 2020-07-13 DIAGNOSIS — R2689 Other abnormalities of gait and mobility: Secondary | ICD-10-CM

## 2020-07-13 DIAGNOSIS — M6281 Muscle weakness (generalized): Secondary | ICD-10-CM

## 2020-07-13 NOTE — Therapy (Signed)
[facility] [address] [place], [place], [address] Phone: [phone]   Fax:  [phone]  Physical Therapy Treatment/Discharge  Patient Details  Name: [name] MRN: [id number] Date of Birth: 1961-06-07 Referring Provider (PT): [name], FNP  PHYSICAL THERAPY DISCHARGE SUMMARY  Visits from Start of Care: 10  Current functional level related to goals / functional outcomes: See clinical impression and goals for more information. Pt is independent with gait. He continues to have increased pain with longer walk distance but doing well with all other exercises.    Remaining deficits: Back pain   Education / Equipment: HEP  Plan: Patient agrees to discharge.  Patient goals were met. Patient is being discharged due to meeting the stated rehab goals.  ?????       Encounter Date: 07/13/2020   PT End of Session - 07/13/20 1106    Visit Number 10    Number of Visits 13    Authorization Type medicaid healthy blue- 27 visits/year with auth. 8/20-9/29 12 visits    Authorization - Visit Number 9    Authorization - Number of Visits 12    PT Start Time 1104    PT Stop Time 2025   discharge visit so finished early   PT Time Calculation (min) 32 min    Activity Tolerance Patient tolerated treatment well    Behavior During Therapy WFL for tasks assessed/performed           Past Medical History:  Diagnosis Date  . Anemia   . Arthritis   . Back pain   . CAD (coronary artery disease)    a. s/p DES to LAD in 05/2016  . Cervical radiculopathy   . Chronic diastolic CHF (congestive heart failure) ([place])   . Chronic pain   . Depression   . DVT (deep venous thrombosis) ([place])   . Hematemesis   . Hepatic steatosis   . Hyperlipidemia   . Hypertension   . IBS (irritable bowel syndrome)   . Morbid obesity ([place])   . OSA (obstructive sleep apnea)   . Pancreatitis   . PE (pulmonary thromboembolism) ([place])   .  Peripheral neuropathy   . PUD (peptic ulcer disease)   . Renal disorder   . Stroke [facility])    a. ?details unclear - not seen on imaging when he was admitted in 05/2017 for TIA symptoms which were felt due to cervical radiculopathy.  . Thoracic aortic ectasia (H[facility])    a. 4.3cm ectatic ascending thoracic aorta by CT 06/2017.   . Type 2 diabetes mellitus ([place])     Past Surgical History:  Procedure Laterality Date  . CARDIAC CATHETERIZATION N/A 05/31/2016   Procedure: Left Heart Cath and Coronary Angiography;  Surgeon: Peter M Martinique, MD;  Location: [facility];  Service: Cardiovascular;  Laterality: N/A;  . CARDIAC CATHETERIZATION N/A 05/31/2016   Procedure: Intravascular Pressure Wire/FFR Study;  Surgeon: Peter M Martinique, MD;  Location: [facility];  Service: Cardiovascular;  Laterality: N/A;  . CARDIAC CATHETERIZATION N/A 05/31/2016   Procedure: Coronary Stent Intervention;  Surgeon: Peter M Martinique, MD;  Location: [facility];  Service: Cardiovascular;  Laterality: N/A;  . COLONOSCOPY WITH PROPOFOL N/A 04/29/2020   Procedure: COLONOSCOPY WITH PROPOFOL;  Surgeon: Wilford Corner, MD;  Location: [facility];  Service: Endoscopy;  Laterality: N/A;  . ESOPHAGOGASTRODUODENOSCOPY N/A 04/29/2020   Procedure: ESOPHAGOGASTRODUODENOSCOPY (EGD);  Surgeon: Wilford Corner, MD;  Location: [facility];  Service: Endoscopy;  Laterality: N/A;  . LEFT HEART CATH AND CORONARY ANGIOGRAPHY N/A 12/08/2017   Procedure: LEFT HEART CATH AND CORONARY ANGIOGRAPHY;  Surgeon: Leonie Man, MD;  Location: [facility];  Service: Cardiovascular;  Laterality: N/A;  . LEFT HEART CATHETERIZATION WITH CORONARY ANGIOGRAM N/A 02/03/2014   Procedure: LEFT HEART CATHETERIZATION WITH CORONARY ANGIOGRAM;  Surgeon: Pixie Casino, MD;  Location: [facility] CATH LAB;  Service: Cardiovascular;  Laterality: N/A;  . left leg stent     . POLYPECTOMY  04/29/2020   Procedure: POLYPECTOMY;  Surgeon: Wilford Corner, MD;   Location: [facility] ENDOSCOPY;  Service: Endoscopy;;    There were no vitals filed for this visit.   Subjective Assessment - 07/13/20 1106    Subjective Pt saw the GI doctor. Reports they said something about fatty liver disease. No med changes. Want him to continue to exercise.    Pertinent History neuropathy, obesity, recent GI bleed with transfusion, DM 2, 5 CVAs, atypical chest pain, HTN,    How long can you walk comfortably? <1 mile    Diagnostic tests 11/2016 lumbar x-rays: disc space flattening with mild anteriorspurring at T11-T12 level. Stable mild anterior spurring at L1-L2level. Stable moderate anterior spurring at L3-L4 level. Mildanterior spurring upper endplate of L5. Stable mild disc spaceflattening at L5-S1 level. Mild facet degenerative changes at L4 andL5 level.    Patient Stated Goals Pt wants to be painfree    Currently in Pain? Yes    Pain Score 5     Pain Location Back   hips   Pain Orientation Lower    Pain Descriptors / Indicators Aching    Pain Type Chronic pain    Pain Onset More than a month ago    Pain Frequency Constant    Pain Onset More than a month ago              [facility] PT Assessment - 07/13/20 1108      Observation/Other Assessments   Oswestry Disability Index  28%                         [facility] Adult PT Treatment/Exercise - 07/13/20 1108      Ambulation/Gait   Ambulation/Gait Yes    Ambulation/Gait Assistance 7: Independent    Ambulation/Gait Assistance Details around in clinic during session    Assistive device None    Gait Pattern Step-through pattern    Ambulation Surface Level;Indoor      Exercises   Exercises Other Exercises    Other Exercises  Reviewed HEP from [facility] as noted below.      Knee/Hip Exercises: Aerobic   Other Aerobic Sci-Fit x 10 min level 2.5 for increased aerobic activity and strengthening.            Seated Hamstring Stretch - 1 x daily - 7 x weekly - 1 sets - 3 reps - 30 hold Seated Flexion  Stretch with Swiss Ball - 1 x daily - 7 x weekly - 1 sets - 10 reps - 15 hold  -performed without ball. Pt using rolling chair at home. Mini Squat with Counter Support - 1 x daily - 7 x weekly - 1 sets - 10 reps Seated Marching with Opposite Shoulder Flexion - 1 x daily - 7 x weekly - 2 sets - 10 reps Standing Lumbar Spine Flexion Stretch Counter - 1-2 x daily - 5 x weekly - 1-2 sets - 10 reps Seated Long Arc Quad - 1  x daily - 5 x weekly - 2 sets - 10 reps       PT Education - 07/13/20 1133    Education Details Removed supine exercises as aggravates his reflux and removed seated piriformis stretch as can not get leg up to perform. Discussed d/c today as planned.    Person(s) Educated Patient    Methods Explanation    Comprehension Verbalized understanding            PT Short Term Goals - 06/30/20 1152      PT SHORT TERM GOAL #1   Title Pt will be independent with initial HEP for strengthening and flexibility.    Baseline pt doing his exercises at home. has not had the chance to go to [employer]    Time 3    Period Weeks    Status Achieved    Target Date 06/29/20      PT SHORT TERM GOAL #2   Title Pt will be instructed in proper body mechanics with activities to protect back around home and be able to return demonstrate.    Baseline educated on body mechanics, and able to demonstrate    Time 3    Period Weeks    Status Achieved    Target Date 06/29/20             PT Long Term Goals - 07/13/20 1220      PT LONG TERM GOAL #1   Title Pt will be independent with progress HEP for strengthening, flexibility and aerobic exercise to continue with gym program.    Baseline Pt is independent with HEP. Has been going to the gym for cardio.    Time 6    Period Weeks    Status Achieved      PT LONG TERM GOAL #2   Title Pt will decrease 5 x sit to stand from 19.18 sec to <16 sec for improved functional strength and balance.    Baseline 19.18 sec from chair with arms on  06/08/20, 07/06/20 10 sec    Time 6    Period Weeks    Status Achieved      PT LONG TERM GOAL #3   Title Pt will decrease Oswestry score from 44% to <35% for decreased self perceived disability due to back pain.    Baseline 44% on 06/08/20. 07/13/20 28%    Time 6    Period Weeks    Status Achieved      PT LONG TERM GOAL #4   Title Pt will ambulate >6 min with <6/10 back pain for improved function.    Baseline 8-10/10 back pain currently. Reports only able to walk 3 min on [place]. 07/06/20 2 min 42 sec overground with 7/10 pain in back    Time 6    Period Weeks    Status Not Met                 Plan - 07/13/20 1221    Clinical Impression Statement PT reassessed LTGs today. Pt has met all goals except for walking with less pain over longer distance. He has been instructed to sit and rest back as needed and then resume. Pt tolerates aerobic exercise on the seated steppers well with no pain. He improved his Oswestry score to 28% showing decreased self perceived disability due to back pain. PT is discharging to home program at this time.    Personal Factors and Comorbidities Comorbidity 3+;Time since onset of injury/illness/exacerbation  Comorbidities neuropathy, obesity, recent GI bleed with transfusion, DM 2, 5 CVAs, atypical chest pain, HTN,    Examination-Activity Limitations Sleep;Stand;Locomotion Level;Transfers;Bed Mobility    Examination-Participation Restrictions Community Activity;Meal Prep;Yard Work    Merchant [employer] officer Evolving/Moderate complexity    Rehab Potential Good    PT Frequency 2x / week   plus eval   PT Duration 6 weeks    PT Treatment/Interventions ADLs/Self Care Home Management;Aquatic Therapy;Cryotherapy;Moist Heat;DME Instruction;Gait training;Stair training;Functional mobility training;Therapeutic activities;Therapeutic exercise;Patient/family education;Neuromuscular re-education;Balance training;Passive range of motion;Manual  techniques;Spinal Manipulations;Joint Manipulations    PT Next Visit Plan Discharged today.    Consulted and Agree with Plan of Care Patient           Patient will benefit from skilled therapeutic intervention in order to improve the following deficits and impairments:  Abnormal gait, Decreased range of motion, Decreased mobility, Decreased strength, Impaired flexibility, Pain, Improper body mechanics, Decreased balance, Decreased endurance  Visit Diagnosis: Other abnormalities of gait and mobility  Muscle weakness (generalized)  Chronic right-sided low back pain with right-sided sciatica     Problem List Patient Active Problem List   Diagnosis Date Noted  . Upper GI bleed 04/28/2020  . Hypertensive urgency 04/27/2020  . Acute GI bleeding 04/27/2020  . Mixed diabetic hyperlipidemia associated with type 2 diabetes mellitus ([place]) 04/27/2020  . Chronic diastolic CHF (congestive heart failure) ([place]) 08/23/2019  . ARF (acute renal failure) ([place]) 08/01/2019  . Hyperkalemia 08/01/2019  . Uncontrolled type 2 diabetes mellitus with hyperglycemia ([place]) 08/01/2019  . AKI (acute kidney injury) ([place])   . Hypotension 07/31/2019  . DKA (diabetic ketoacidoses) ([place]) 05/05/2019  . Blurred vision 05/05/2019  . Alteration consciousness 11/19/2018  . Neck pain 10/09/2018  . Left arm weakness 10/09/2018  . B12 deficiency 08/10/2017  . Persistent headaches 08/09/2017  . GERD (gastroesophageal reflux disease) 06/29/2017  . Left arm numbness   . Cerebral embolism with cerebral infarction 06/17/2017  . TIA (transient ischemic attack) 06/17/2017  . Chronic back pain 12/29/2016  . Depression 12/15/2016  . Vitamin D deficiency 12/09/2016  . Right hip pain 12/07/2016  . Hypokalemia 12/07/2016  . Type 2 diabetes mellitus with vascular disease ([place]) 05/31/2016  . Normocytic normochromic anemia 05/31/2016  . Chest pain 05/31/2016  . Coronary artery disease involving native coronary artery of  native heart without angina pectoris 01/06/2016  . DVT (deep venous thrombosis) ([place]) 01/06/2016  . Lactic acidosis 05/28/2014  . Nonspecific chest pain 01/29/2014  . Uncontrolled secondary diabetes with peripheral neuropathy ([place]) 01/29/2014  . Obesity, Class III, BMI 40-49.9 (morbid obesity) ([place]) 01/29/2014  . Snoring 01/29/2014  . Dyslipidemia 01/29/2014  . HTN (hypertension) 01/29/2014  . Abnormal nuclear stress test 01/29/2014    Electa Sniff, PT, DPT, NCS 07/13/2020, 12:25 PM  [facility] [address] [place], [place], [address] Phone: [phone]   Fax:  [phone]  Name: [name] MRN: [id number] Date of Birth: 1960-12-11

## 2020-07-13 NOTE — Patient Instructions (Signed)
Access Code: [email or web address] URL: [email or web address] Date: 07/13/2020 Prepared by: Cherly Anderson  Exercises Seated Hamstring Stretch - 1 x daily - 7 x weekly - 1 sets - 3 reps - 30 hold Seated Flexion Stretch with Swiss Ball - 1 x daily - 7 x weekly - 1 sets - 10 reps - 15 hold Mini Squat with Counter Support - 1 x daily - 7 x weekly - 1 sets - 10 reps Seated Marching with Opposite Shoulder Flexion - 1 x daily - 7 x weekly - 2 sets - 10 reps Standing Lumbar Spine Flexion Stretch Counter - 1-2 x daily - 5 x weekly - 1-2 sets - 10 reps Seated Long Arc Quad - 1 x daily - 5 x weekly - 2 sets - 10 reps  Patient Education Biomedical scientist

## 2020-07-15 ENCOUNTER — Encounter: Payer: Self-pay | Admitting: Occupational Therapy

## 2020-07-15 ENCOUNTER — Telehealth: Payer: Self-pay | Admitting: Cardiology

## 2020-07-15 ENCOUNTER — Ambulatory Visit: Payer: Medicaid Other | Admitting: Occupational Therapy

## 2020-07-15 ENCOUNTER — Other Ambulatory Visit: Payer: Self-pay

## 2020-07-15 ENCOUNTER — Ambulatory Visit: Payer: Medicaid Other

## 2020-07-15 DIAGNOSIS — M79642 Pain in left hand: Secondary | ICD-10-CM

## 2020-07-15 DIAGNOSIS — R2689 Other abnormalities of gait and mobility: Secondary | ICD-10-CM | POA: Diagnosis not present

## 2020-07-15 DIAGNOSIS — M25542 Pain in joints of left hand: Secondary | ICD-10-CM

## 2020-07-15 DIAGNOSIS — R278 Other lack of coordination: Secondary | ICD-10-CM

## 2020-07-15 NOTE — Patient Instructions (Addendum)
1. Grip Strengthening (Resistive Putty)   Squeeze putty using thumb and all fingers. Repeat _20___ times. Do __2__ sessions per day.   2. Roll putty into tube on table and pinch between each finger and thumb x 10 reps each. (can do ring and small finger together)     Copyright  VHI. All rights reserved.  Flexor Tendon Gliding (Active Hook Fist)   With fingers and knuckles straight, bend middle and tip joints. Do not bend large knuckles. Repeat _10-15___ times. Do _4-6___ sessions per day.  MP Flexion (Active)   With back of hand on table, bend large knuckles as far as they will go, keeping small joints straight. Repeat _10-15___ times. Do __4-6__ sessions per day. Activity: Reach into a narrow container.*      Finger Flexion / Extension   With palm up, bend fingers of left hand toward palm, making a  fist. Straighten fingers, opening fist. Repeat sequence _10-15___ times per session. Do _4-6__ sessions per day. Hand Variation: Palm down   Copyright  VHI. All rights reserved.

## 2020-07-15 NOTE — Progress Notes (Signed)
Cardiology Clinic Note   Patient Name: [name] Date of Encounter: 07/17/2020  Primary Care Provider:  [name], FNP Primary Cardiologist:  Kirk Ruths, MD  Patient Profile    [name] 59 year old male presents the clinic today for follow-up of his hypertension and coronary artery disease.  Past Medical History    Past Medical History:  Diagnosis Date  . Anemia   . Arthritis   . Back pain   . CAD (coronary artery disease)    a. s/p DES to LAD in 05/2016  . Cervical radiculopathy   . Chronic diastolic CHF (congestive heart failure) ([place])   . Chronic pain   . Depression   . DVT (deep venous thrombosis) ([place])   . Hematemesis   . Hepatic steatosis   . Hyperlipidemia   . Hypertension   . IBS (irritable bowel syndrome)   . Morbid obesity ([place])   . OSA (obstructive sleep apnea)   . Pancreatitis   . PE (pulmonary thromboembolism) ([place])   . Peripheral neuropathy   . PUD (peptic ulcer disease)   . Renal disorder   . Stroke [facility])    a. ?details unclear - not seen on imaging when he was admitted in 05/2017 for TIA symptoms which were felt due to cervical radiculopathy.  . Thoracic aortic ectasia (H[facility])    a. 4.3cm ectatic ascending thoracic aorta by CT 06/2017.   . Type 2 diabetes mellitus ([place])    Past Surgical History:  Procedure Laterality Date  . CARDIAC CATHETERIZATION N/A 05/31/2016   Procedure: Left Heart Cath and Coronary Angiography;  Surgeon: Peter M Martinique, MD;  Location: [place] CV LAB;  Service: Cardiovascular;  Laterality: N/A;  . CARDIAC CATHETERIZATION N/A 05/31/2016   Procedure: Intravascular Pressure Wire/FFR Study;  Surgeon: Peter M Martinique, MD;  Location: [place] CV LAB;  Service: Cardiovascular;  Laterality: N/A;  . CARDIAC CATHETERIZATION N/A 05/31/2016   Procedure: Coronary Stent Intervention;  Surgeon: Peter M Martinique, MD;  Location: [place] CV LAB;  Service: Cardiovascular;  Laterality: N/A;  . COLONOSCOPY WITH PROPOFOL  N/A 04/29/2020   Procedure: COLONOSCOPY WITH PROPOFOL;  Surgeon: Wilford Corner, MD;  Location: [place];  Service: Endoscopy;  Laterality: N/A;  . ESOPHAGOGASTRODUODENOSCOPY N/A 04/29/2020   Procedure: ESOPHAGOGASTRODUODENOSCOPY (EGD);  Surgeon: Wilford Corner, MD;  Location: [place];  Service: Endoscopy;  Laterality: N/A;  . LEFT HEART CATH AND CORONARY ANGIOGRAPHY N/A 12/08/2017   Procedure: LEFT HEART CATH AND CORONARY ANGIOGRAPHY;  Surgeon: Leonie Man, MD;  Location: [place] CV LAB;  Service: Cardiovascular;  Laterality: N/A;  . LEFT HEART CATHETERIZATION WITH CORONARY ANGIOGRAM N/A 02/03/2014   Procedure: LEFT HEART CATHETERIZATION WITH CORONARY ANGIOGRAM;  Surgeon: Pixie Casino, MD;  Location: [facility] CATH LAB;  Service: Cardiovascular;  Laterality: N/A;  . left leg stent     . POLYPECTOMY  04/29/2020   Procedure: POLYPECTOMY;  Surgeon: Wilford Corner, MD;  Location: [facility] ENDOSCOPY;  Service: Endoscopy;;    Allergies  Allergies  Allergen Reactions  . Coconut Flavor [Flavoring Agent] Hives  . Coconut Oil Hives  . Ibuprofen Other (See Comments)    Made gastric ulcers worse  . Aleve [Naproxen] Other (See Comments)    DUE TO KIDNEYS  . Nsaids Other (See Comments)    Stomach ulcers   . Vascepa [Icosapent Ethyl] Other (See Comments)    headaches, chest pain, similar to sx of a stroke, hypotension     History of Present Illness  [name] has a PMH of hypertension, type 2 diabetes, coronary artery disease, DVT, cerebral embolism with cerebral infarct, TIA, chronic diastolic CHF, and hypertensive urgency.  He received PCI and DES to his LAD 8/17.  CTA 9/17 showed no pulmonary embolus.  Cardiac catheterization 2/19 showed patent stents and normal LV function.  His LVEDP was 27 mmHg.  Carotid Dopplers 4/19 showed 1-39% right and nearly normal left carotid.  CTA August 2019 showed no thoracic aortic aneurysm.  Echocardiogram 6/20 showed normal LV function, mild LVH,  G1 DD and mildly dilated aortic root.  He was last seen by Dr. Stanford Breed on 01/14/2020.  During that time he described occasional dyspnea and chest pain which was chronic.  This typically occurred with activities.  He was exercising on the elliptical, exercise bike and treadmill and stated that he had no chest pain with these activities.  He had no recurrent syncopal events.  He follows with Dr. Debara Pickett for management of his lipids  He contacted nurse triage line on 07/15/2020 and indicated that he was having chest discomfort and increased shortness of breath.  He was instructed to take his furosemide 80 mg 3 times daily x2 days.  He presents to the clinic today for follow-up evaluation states he was worried about becoming dehydrated.  He increased his fluid consumption and only took extra Lasix in the morning and afternoon for the last 3 days.  He states that he has not been able to be as physically active and was only using his elliptical and stationary bike 1 times per week.  I have encouraged him to start chewing sugar-free gum and using sugar-free candy to help with his thirst.  I will start him on a 64 ounce fluid restriction, give him the salty 6 diet sheet with 1800 mg sodium recommendation, continue to weigh himself daily and follow-up in 3 months.  Today he denies chest pain, shortness of breath, lower extremity edema, fatigue, palpitations, melena, hematuria, hemoptysis, diaphoresis, weakness, presyncope, syncope, orthopnea, and PND.   Home Medications    Prior to Admission medications   Medication Sig Start Date End Date Taking? Authorizing Provider  Accu-Chek FastClix Lancets MISC 1 Device by Other route 2 (two) times daily.  03/20/19   [provider]  Albuterol Sulfate (PROAIR RESPICLICK) 093 (90 Base) MCG/ACT AEPB Inhale 2 puffs into the lungs every 6 (six) hours as needed. Patient taking differently: Inhale 2 puffs into the lungs every 6 (six) hours as needed (for breathing).   11/06/18   Robyn Haber, MD  allopurinol (ZYLOPRIM) 100 MG tablet Take 100 mg by mouth daily. 03/31/20   [provider]  aspirin 81 MG chewable tablet Chew 1 tablet (81 mg total) by mouth daily. 06/01/16   Elwin Mocha, MD  atorvastatin (LIPITOR) 40 MG tablet Take 1 tablet (40 mg total) by mouth daily. 02/12/20 05/12/20  Pixie Casino, MD  carvedilol (COREG) 25 MG tablet Take 1 tablet (25 mg total) by mouth 2 (two) times daily with a meal. 01/26/17   Strader, [place] M, PA-C  [facility] VITAMIN B12 1000 MCG tablet Take 1,000 mcg by mouth daily. 03/06/20   [provider]  diclofenac (FLECTOR) 1.3 % PTCH Place 1 patch onto the skin daily as needed for pain. 03/02/20   [provider]  DULoxetine (CYMBALTA) 60 MG capsule Take 1 capsule (60 mg total) by mouth daily. Please request future refills from PCP. 11/28/19   Marcial Pacas, MD  famotidine (PEPCID) 20 MG tablet  Take 20 mg by mouth daily. 03/05/20   [provider]  fenofibrate (TRICOR) 145 MG tablet Take 1 tablet (145 mg total) by mouth daily. 06/18/20   Hilty, Nadean Corwin, MD  furosemide (LASIX) 40 MG tablet Take 1 tablet (40 mg total) by mouth 3 (three) times daily. 08/23/19 08/22/20  Darliss Cheney, MD  glipiZIDE (GLUCOTROL) 10 MG tablet Take 10 mg by mouth 2 (two) times daily. 05/30/17   [provider]  HUMULIN R U-500 KWIKPEN 500 UNIT/ML kwikpen SMARTSIG:45 Unit(s) SUB-Q 3 Times Daily PRN 06/04/20   [provider]  Insulin Pen Needle 31G X 5 MM MISC Use 1 needle daily to inject insulin as prescribed Patient taking differently: 1 each by Other route See admin instructions. Use 1 needle daily to inject insulin as prescribed 06/18/17   Barton Dubois, MD  isosorbide mononitrate (IMDUR) 30 MG 24 hr tablet Take 1 tablet (30 mg total) by mouth daily. 10/16/19   Lendon Colonel, NP  lisinopril (ZESTRIL) 5 MG tablet Take 5 mg by mouth daily. 03/05/20   [provider]  nitroGLYCERIN (NITROSTAT) 0.4 MG  SL tablet Place 1 tablet (0.4 mg total) under the tongue every 5 (five) minutes as needed for chest pain. 02/06/19   Skeet Latch, MD  ondansetron (ZOFRAN) 4 MG tablet Take 4 mg by mouth 2 (two) times daily as needed for nausea/vomiting. 04/21/20   [provider]  oxyCODONE (ROXICODONE) 15 MG immediate release tablet Take 15 mg 3 (three) times daily as needed by mouth for pain.  08/28/17   [provider]  pantoprazole (PROTONIX) 40 MG tablet Take 1 tablet (40 mg total) by mouth 2 (two) times daily. 04/30/20   Domenic Polite, MD  potassium chloride (MICRO-K) 10 MEQ CR capsule Take 10 mEq by mouth in the morning and at bedtime. 04/03/20   [provider]  pregabalin (LYRICA) 300 MG capsule Take 300 mg by mouth 2 (two) times daily. 05/18/19   [provider]  sertraline (ZOLOFT) 50 MG tablet Take 50 mg by mouth daily.    [provider]  tiZANidine (ZANAFLEX) 4 MG tablet Take 4 mg by mouth 3 (three) times daily as needed for muscle spasms. 03/31/20   [provider]  traZODone (DESYREL) 50 MG tablet Take 1 tablet (50 mg total) by mouth at bedtime. 12/15/16   Burns, Arloa Koh, MD  Vitamin D, Ergocalciferol, (DRISDOL) 1.25 MG (50000 UT) CAPS capsule Take 50,000 Units by mouth once a week. mondays 08/15/19   [provider]  XTAMPZA ER 27 MG C12A Take 27 mg by mouth 2 (two) times daily.  Patient not taking: Reported on 07/06/2020 08/15/19   [provider]    Family History    Family History  Problem Relation Age of Onset  . Cancer Father   . Hypertension Mother   . Diabetes Mother   . Breast cancer Mother   . Hypertension Brother   . Diabetes Brother   . Hypertension Sister   . Diabetes Sister    He indicated that his mother is alive. He indicated that his father is deceased. He indicated that the status of his sister is unknown. He indicated that the status of his brother is unknown. He indicated that his maternal grandmother  is deceased. He indicated that his maternal grandfather is deceased. He indicated that his paternal grandmother is deceased. He indicated that his paternal grandfather is deceased.  Social History    Social History   Socioeconomic  History  . Marital status: Single    Spouse name: Not on file  . Number of children: 3  . Years of education: 70  . Highest education level: Not on file  Occupational History  . Occupation: Disabled  Tobacco Use  . Smoking status: Never Smoker  . Smokeless tobacco: Never Used  Vaping Use  . Vaping Use: Never used  Substance and Sexual Activity  . Alcohol use: No  . Drug use: No  . Sexual activity: Not on file  Other Topics Concern  . Not on file  Social History Narrative   Independent and ambulatory with cane.   Lives at home alone.   Right-handed.   1-2 cups caffeine per day.   Social Determinants of Health   Financial Resource Strain:   . Difficulty of Paying Living Expenses: Not on file  Food Insecurity:   . Worried About Charity fundraiser in the Last Year: Not on file  . Ran Out of Food in the Last Year: Not on file  Transportation Needs:   . Lack of Transportation (Medical): Not on file  . Lack of Transportation (Non-Medical): Not on file  Physical Activity:   . Days of Exercise per Week: Not on file  . Minutes of Exercise per Session: Not on file  Stress:   . Feeling of Stress : Not on file  Social Connections:   . Frequency of Communication with Friends and Family: Not on file  . Frequency of Social Gatherings with Friends and Family: Not on file  . Attends Religious Services: Not on file  . Active Member of Clubs or Organizations: Not on file  . Attends Archivist Meetings: Not on file  . Marital Status: Not on file  Intimate Partner Violence:   . Fear of Current or Ex-Partner: Not on file  . Emotionally Abused: Not on file  . Physically Abused: Not on file  . Sexually Abused: Not on file     Review of Systems     General:  No chills, fever, night sweats or weight changes.  Cardiovascular:  No chest pain, dyspnea on exertion, edema, orthopnea, palpitations, paroxysmal nocturnal dyspnea. Dermatological: No rash, lesions/masses Respiratory: No cough, dyspnea Urologic: No hematuria, dysuria Abdominal:   No nausea, vomiting, diarrhea, bright red blood per rectum, melena, or hematemesis Neurologic:  No visual changes, wkns, changes in mental status. All other systems reviewed and are otherwise negative except as noted above.  Physical Exam    VS:  BP (!) 144/78 (BP Location: Left Arm, Patient Position: Sitting, Cuff Size: Large)   Pulse 84   Temp 98 F (36.7 C) (Tympanic)   Resp 17   Ht 5\' 10"  (1.778 m)   Wt (!) 340 lb 3.2 oz (154.3 kg)   SpO2 90%   BMI 48.81 kg/m  , BMI Body mass index is 48.81 kg/m. GEN: Well nourished, well developed, in no acute distress. HEENT: normal. Neck: Supple, no JVD, carotid bruits, or masses. Cardiac: RRR, no murmurs, rubs, or gallops. No clubbing, cyanosis, generalized nonpitting lower extremity edema.  Radials/DP/PT 2+ and equal bilaterally.  Respiratory:  Respirations regular and unlabored, clear to auscultation bilaterally. GI: Soft, nontender, nondistended, BS + x 4. MS: no deformity or atrophy. Skin: warm and dry, no rash. Neuro:  Strength and sensation are intact. Psych: Normal affect.  Accessory Clinical Findings    Recent Labs: 08/22/2019: B Natriuretic Peptide 58.1 04/28/2020: Magnesium 1.9 04/30/2020: ALT 17; BUN 7; Creatinine, Ser 0.87; Hemoglobin 11.2;  Platelets 191; Potassium 3.9; Sodium 138   Recent Lipid Panel    Component Value Date/Time   CHOL 210 (H) 06/17/2020 0854   TRIG 1,849 (HH) 06/17/2020 0854   HDL 24 (L) 06/17/2020 0854   CHOLHDL 8.8 (H) 06/17/2020 0854   CHOLHDL 4.3 03/08/2019 1008   VLDL 43 (H) 12/08/2017 0212   LDLCALC Comment (A) 06/17/2020 0854   LDLCALC  03/08/2019 1008     Comment:     . LDL cholesterol not  calculated. Triglyceride levels greater than 400 mg/dL invalidate calculated LDL results. . Reference range: <100 . Desirable range <100 mg/dL for primary prevention;   <70 mg/dL for patients with CHD or diabetic patients  with > or = 2 CHD risk factors. Marland Kitchen LDL-C is now calculated using the Martin-Hopkins  calculation, which is a validated novel method providing  better accuracy than the Friedewald equation in the  estimation of LDL-C.  Cresenciano Genre et al. Annamaria Helling. 6546;503(54): 2061-2068  (http://education.[employer].com/faq/FAQ164)    LDLDIRECT 48 02/10/2020 0929    ECG personally reviewed by me today-normal sinus rhythm inferior infarct undetermined age 70 bpm- No acute changes  Echocardiogram 04/22/2019 IMPRESSIONS    1. The left ventricle has normal systolic function with an ejection  fraction of 60-65%. The cavity size was normal. There is mildly increased  left ventricular wall thickness. Left ventricular diastolic Doppler  parameters are consistent with impaired  relaxation.  2. The right ventricle has normal systolic function. The cavity was  normal.  3. There is mild dilatation of the ascending aorta measuring 38 mm.  4. The aortic valve is tricuspid.  5. Normal LV systolic function; mild diastolic dysfunction; mild LVH.  6. The mitral valve is grossly normal.   Assessment & Plan   1.  Acute on chronic diastolic CHF-generalized lower extremity nonpitting edema.  Weight today 340.2.  Recently contacted nurse triage line was instructed to double his Lasix.  Echocardiogram 04/22/2019 showed normal LVEF and impaired diastolic parameters Continue furosemide, carvedilol Heart healthy low-sodium diet-salty 6 given-1800 mg sodium restriction Increase physical activity as tolerated Daily weights Contact office with weight increase of 3 pounds overnight or 5 pounds in 1 week Lower extremity support stockings-[place] support stockings sheet given Elevate lower  extremities when not active Fluid restriction 64 ounces Order BMP  Essential hypertension-BP today 144/78.  Well-controlled at home 120s-130s over 70s Continue lisinopril, Imdur, carvedilol Heart healthy low-sodium diet-salty 6 given Increase physical activity as tolerated  Coronary artery disease-no chest pain today.  Continues with chronic occasional intermittent chest discomfort and shortness of breath with increased physical activity. Continue aspirin, atorvastatin, Imdur, fenofibrate, carvedilol Heart healthy low-sodium diet-salty 6 given Increase physical activity as tolerated  Morbid obesity-weight today 340.3 pounds. Continue weight loss Heart healthy low-sodium diet Increase physical activity as tolerated  Disposition: Follow-up with Dr. Stanford Breed or me in 3 months.   Jossie Ng. Mong Neal NP-C    07/17/2020, 3:29 PM [address] [place] Office [phone] Fax [phone]  Notice: This dictation was prepared with Dragon dictation along with smaller phrase technology. Any transcriptional errors that result from this process are unintentional and may not be corrected upon review.

## 2020-07-15 NOTE — Therapy (Signed)
[facility] [address] [place], [place], [address] Phone: [phone]   Fax:  [phone]  Occupational Therapy Treatment  Patient Details  Name: [name] MRN: [id number] Date of Birth: 1961/08/01 Referring Provider (OT): [name]   Encounter Date: 07/15/2020   OT End of Session - 07/15/20 1017    Visit Number 2    Number of Visits 25    Date for OT Re-Evaluation 08/17/20    Authorization Type Medicaid Blue - 27 ot/pt/st    Authorization Time Period 27 Visits between OT/PT/ST    Authorization - Visit Number 2    Authorization - Number of Visits 10    Progress Note Due on Visit 10    OT Start Time 1017    OT Stop Time 1100    OT Time Calculation (min) 43 min    Activity Tolerance Patient tolerated treatment well    Behavior During Therapy [facility] for tasks assessed/performed           Past Medical History:  Diagnosis Date  . Anemia   . Arthritis   . Back pain   . CAD (coronary artery disease)    a. s/p DES to LAD in 05/2016  . Cervical radiculopathy   . Chronic diastolic CHF (congestive heart failure) ([place])   . Chronic pain   . Depression   . DVT (deep venous thrombosis) ([place])   . Hematemesis   . Hepatic steatosis   . Hyperlipidemia   . Hypertension   . IBS (irritable bowel syndrome)   . Morbid obesity ([place])   . OSA (obstructive sleep apnea)   . Pancreatitis   . PE (pulmonary thromboembolism) ([place])   . Peripheral neuropathy   . PUD (peptic ulcer disease)   . Renal disorder   . Stroke [facility])    a. ?details unclear - not seen on imaging when he was admitted in 05/2017 for TIA symptoms which were felt due to cervical radiculopathy.  . Thoracic aortic ectasia (HCC)    a. 4.3cm ectatic ascending thoracic aorta by CT 06/2017.   . Type 2 diabetes mellitus ([place])     Past Surgical History:  Procedure Laterality Date  . CARDIAC CATHETERIZATION N/A 05/31/2016   Procedure: Left Heart Cath and  Coronary Angiography;  Surgeon: Peter M Martinique, MD;  Location: [place] CV LAB;  Service: Cardiovascular;  Laterality: N/A;  . CARDIAC CATHETERIZATION N/A 05/31/2016   Procedure: Intravascular Pressure Wire/FFR Study;  Surgeon: Peter M Martinique, MD;  Location: [place] CV LAB;  Service: Cardiovascular;  Laterality: N/A;  . CARDIAC CATHETERIZATION N/A 05/31/2016   Procedure: Coronary Stent Intervention;  Surgeon: Peter M Martinique, MD;  Location: [place] CV LAB;  Service: Cardiovascular;  Laterality: N/A;  . COLONOSCOPY WITH PROPOFOL N/A 04/29/2020   Procedure: COLONOSCOPY WITH PROPOFOL;  Surgeon: Wilford Corner, MD;  Location: [place];  Service: Endoscopy;  Laterality: N/A;  . ESOPHAGOGASTRODUODENOSCOPY N/A 04/29/2020   Procedure: ESOPHAGOGASTRODUODENOSCOPY (EGD);  Surgeon: Wilford Corner, MD;  Location: [place];  Service: Endoscopy;  Laterality: N/A;  . LEFT HEART CATH AND CORONARY ANGIOGRAPHY N/A 12/08/2017   Procedure: LEFT HEART CATH AND CORONARY ANGIOGRAPHY;  Surgeon: Leonie Man, MD;  Location: [place] CV LAB;  Service: Cardiovascular;  Laterality: N/A;  . LEFT HEART CATHETERIZATION WITH CORONARY ANGIOGRAM N/A 02/03/2014   Procedure: LEFT HEART CATHETERIZATION WITH CORONARY ANGIOGRAM;  Surgeon: Pixie Casino, MD;  Location: [facility] CATH LAB;  Service: Cardiovascular;  Laterality: N/A;  .  left leg stent     . POLYPECTOMY  04/29/2020   Procedure: POLYPECTOMY;  Surgeon: Wilford Corner, MD;  Location: [facility] ENDOSCOPY;  Service: Endoscopy;;    There were no vitals filed for this visit.   Subjective Assessment - 07/15/20 1018    Subjective  Pt said the "weather is not good to me" in regards to the rain and pain.    Pertinent History PMH: coronary artery disease, diabetes mellitus type 2 (insulin dependent), diastolic congestive heart failure, depression, hypertension, obesity, neuropathy, recent GI bleed with transfusion, atypical chest pain, 5 CVAs    Limitations fall risk     Patient Stated Goals Patient stated goals to "get feeling back in my hand and not hurting all the time"    Currently in Pain? Yes    Pain Score 8     Pain Location Hand    Pain Orientation Left;Right    Pain Descriptors / Indicators Stabbing;Aching;Dull;Tender    Pain Type Chronic pain    Pain Radiating Towards every now and then radiates up LUE arm.    Pain Onset More than a month ago    Pain Frequency Intermittent    Aggravating Factors  weather.    Pain Relieving Factors arthritis gloves    Pain Onset More than a month ago                OT Treatments/Exercises (OP) - 07/15/20 0001      Exercises   Exercises Hand;Theraputty      Hand Exercises   Other Hand Exercises AROM of hands and wrist while in fluido      Modalities   Modalities Fluidotherapy      RUE Fluidotherapy   Number Minutes Fluidotherapy 8 Minutes    RUE Fluidotherapy Location Hand;Wrist      LUE Fluidotherapy   Number Minutes Fluidotherapy 8 Minutes    LUE Fluidotherapy Location Hand;Wrist                  OT Education - 07/15/20 1027    Education Details issued yellow theraputty HEP    Person(s) Educated Patient    Methods Explanation;Demonstration    Comprehension Verbalized understanding;Returned demonstration            OT Short Term Goals - 07/06/20 1202      OT SHORT TERM GOAL #1   Title Pt will be independent with HEP 08/03/2020    Time 4    Period Weeks    Status New    Target Date 08/03/20      OT SHORT TERM GOAL #2   Title Pt will improve grip strength by 5 lbs in LUE in order to increase independence and ability to complete ADLs and IADLs    Baseline LUE 73.8 lbs    Time 4    Period Weeks    Status New      OT SHORT TERM GOAL #3   Title Pt will improve functional use of LUE as evidenced by performing 3 button test in 45 seconds or less.    Time 4    Period Weeks    Status New      OT SHORT TERM GOAL #4   Title Patient will report pain no more than 5/10  with functional use of LUE    Time 4    Period Weeks    Status New      OT SHORT TERM GOAL #5   Title Pt will improve lateral pinch strength  by 3 lbs in order to increase ability to manage fasteners with clothing management.    Baseline LUE 17 lbs    Time 4    Period Weeks    Status New             OT Long Term Goals - 07/06/20 1215      OT LONG TERM GOAL #1   Title Pt will be indepedent with updated HEP 08/31/20    Time 6    Period Weeks    Status New    Target Date 08/31/20      OT LONG TERM GOAL #2   Title Pt will improve grip strength by 10 lbs in LUE in order to increase independence and ability to complete ADLs and IADLs    Time 6    Period Weeks    Status New      OT LONG TERM GOAL #3   Title Pt will improve functional use of LUE as evidenced by performing 3 button test in 35 seconds or less.    Time 6    Period Weeks    Status New      OT LONG TERM GOAL #4   Title Patient will report pain no more than 3/10 with functional use of LUE    Time 6    Period Weeks    Status New      OT LONG TERM GOAL #5   Title Patient will report understanding and compliance of orthosis/splint PRN and/or verbalize understanding of proper body mechanics for wrist neutralization.    Time 6    Period Weeks    Status New      OT LONG TERM GOAL #6   Title Pt will improve lateral pinch strength by 6 lbs in order to increase ability to manage fasteners with clothing management.    Baseline LUE 17 lbs    Time 6    Period Weeks    Status New                 Plan - 07/15/20 1023    Clinical Impression Statement Patient experiencing increased pain today with heavy rainfall and pressure changes with the weather per pt report. First session after evaluation and addressing light LUE And RUE range of motion and light resistance to address pain and discomfort.    OT Occupational Profile and History Detailed Assessment- Review of Records and additional review of physical,  cognitive, psychosocial history related to current functional performance    Occupational performance deficits (Please refer to evaluation for details): IADL's;ADL's;Leisure;Rest and Sleep;Social Participation    Body Structure / Function / Physical Skills ADL;Coordination;FMC;Strength;ROM;Body mechanics;IADL;Pain;Sensation;UE functional use;GMC    Rehab Potential Good    Clinical Decision Making Limited treatment options, no task modification necessary    Comorbidities Affecting Occupational Performance: May have comorbidities impacting occupational performance    Modification or Assistance to Complete Evaluation  No modification of tasks or assist necessary to complete eval    OT Frequency 2x / week    OT Duration 6 weeks   or 12 visits + eval over extended time   OT Treatment/Interventions Therapeutic exercise;Self-care/ADL training;Therapeutic activities;Splinting;Ultrasound;Electrical Stimulation;Patient/family education;Moist Heat;Fluidtherapy;DME and/or AE instruction;Manual Therapy;Passive range of motion    Plan Fluido, coordination HEP    Consulted and Agree with Plan of Care Patient           Patient will benefit from skilled therapeutic intervention in order to improve the following deficits and impairments:   Body  Structure / Function / Physical Skills: ADL, Coordination, FMC, Strength, ROM, Body mechanics, IADL, Pain, Sensation, UE functional use, GMC       Visit Diagnosis: Pain in left hand  Pain in joint of left hand  Other lack of coordination    Problem List Patient Active Problem List   Diagnosis Date Noted  . Upper GI bleed 04/28/2020  . Hypertensive urgency 04/27/2020  . Acute GI bleeding 04/27/2020  . Mixed diabetic hyperlipidemia associated with type 2 diabetes mellitus ([place]) 04/27/2020  . Chronic diastolic CHF (congestive heart failure) ([place]) 08/23/2019  . ARF (acute renal failure) ([place]) 08/01/2019  . Hyperkalemia 08/01/2019  . Uncontrolled type 2  diabetes mellitus with hyperglycemia ([place]) 08/01/2019  . AKI (acute kidney injury) ([place])   . Hypotension 07/31/2019  . DKA (diabetic ketoacidoses) ([place]) 05/05/2019  . Blurred vision 05/05/2019  . Alteration consciousness 11/19/2018  . Neck pain 10/09/2018  . Left arm weakness 10/09/2018  . B12 deficiency 08/10/2017  . Persistent headaches 08/09/2017  . GERD (gastroesophageal reflux disease) 06/29/2017  . Left arm numbness   . Cerebral embolism with cerebral infarction 06/17/2017  . TIA (transient ischemic attack) 06/17/2017  . Chronic back pain 12/29/2016  . Depression 12/15/2016  . Vitamin D deficiency 12/09/2016  . Right hip pain 12/07/2016  . Hypokalemia 12/07/2016  . Type 2 diabetes mellitus with vascular disease ([place]) 05/31/2016  . Normocytic normochromic anemia 05/31/2016  . Chest pain 05/31/2016  . Coronary artery disease involving native coronary artery of native heart without angina pectoris 01/06/2016  . DVT (deep venous thrombosis) ([place]) 01/06/2016  . Lactic acidosis 05/28/2014  . Nonspecific chest pain 01/29/2014  . Uncontrolled secondary diabetes with peripheral neuropathy ([place]) 01/29/2014  . Obesity, Class III, BMI 40-49.9 (morbid obesity) ([place]) 01/29/2014  . Snoring 01/29/2014  . Dyslipidemia 01/29/2014  . HTN (hypertension) 01/29/2014  . Abnormal nuclear stress test 01/29/2014    Zachery Conch 07/15/2020, 10:27 AM  [facility] [address] [place], [place], [address] Phone: [phone]   Fax:  [phone]  Name: [name] MRN: [id number] Date of Birth: 06/24/1961

## 2020-07-15 NOTE — Telephone Encounter (Signed)
Spoke with pt, for several days now he has had chest pain and SOB. The pain is in the center to the left side of his chest and is there all the time. It feels different when he takes a deep breath. He also reports SOB when walking out to the car which is new for him. He has swelling in his feet, ankles and hands. He is SOB when lying flat and he usually sleeps in a chair. He called because the symptoms have not gotten any better. He currently takes furosemide 40 mg TID. He will double his furosemide the rest of today and tomorrow if he still has swelling in the morning. He will see the app Friday afternoon. Pt agreed with this plan.

## 2020-07-17 ENCOUNTER — Other Ambulatory Visit: Payer: Self-pay

## 2020-07-17 ENCOUNTER — Encounter: Payer: Self-pay | Admitting: General Practice

## 2020-07-17 ENCOUNTER — Ambulatory Visit: Payer: Medicaid Other | Admitting: General Practice

## 2020-07-17 VITALS — BP 144/78 | HR 84 | Temp 98.0°F | Resp 17 | Ht 70.0 in | Wt 340.2 lb

## 2020-07-17 DIAGNOSIS — I2510 Atherosclerotic heart disease of native coronary artery without angina pectoris: Secondary | ICD-10-CM

## 2020-07-17 DIAGNOSIS — Z79899 Other long term (current) drug therapy: Secondary | ICD-10-CM

## 2020-07-17 DIAGNOSIS — I5033 Acute on chronic diastolic (congestive) heart failure: Secondary | ICD-10-CM

## 2020-07-17 DIAGNOSIS — I10 Essential (primary) hypertension: Secondary | ICD-10-CM

## 2020-07-17 NOTE — Patient Instructions (Signed)
Medication Instructions:  The current medical regimen is effective;  continue present plan and medications as directed. Please refer to the Current Medication list given to you today., *If you need a refill on your cardiac medications before your next appointment, please call your pharmacy*  Lab Work: BMET TODAY If you have labs (blood work) drawn today and your tests are completely normal, you will receive your results only by:  [place] (if you have MyChart) OR A paper copy in the mail.  If you have any lab test that is abnormal or we need to change your treatment, we will call you to review the results. You may go to any [facility] that is convenient for you however, we do have a lab in our office that is able to assist you. You DO NOT need an appointment for our lab. The lab is open 8:00am and closes at 4:00pm. Lunch 12:45 - 1:45pm.  Testing/Procedures: NONE  Special Instructions TAKE AND LOG YOUR DAILY WEIGHTS   FLUID RESTRICTION 64 OUNCES OF ALL FLUIDS DAILY  PLEASE READ AND FOLLOW SALTY 6-ATTACHED-1,800 MG DAILY  MAKE SURE TO USE SUGAR FREE GUM AND CANDY  Follow-Up: Your next appointment:  3 month(s) In Person with Kirk Ruths, MD -[place], FNP-C   At [facility], you and your health needs are our priority.  As part of our continuing mission to provide you with exceptional heart care, we have created designated Provider Care Teams.  These Care Teams include your primary Cardiologist (physician) and Advanced Practice Providers (APPs -  Physician Assistants and Nurse Practitioners) who all work together to provide you with the care you need, when you need it.  We recommend signing up for the patient portal called "MyChart".  Sign up information is provided on this After Visit Summary.  MyChart is used to connect with patients for Virtual Visits (Telemedicine).  Patients are able to view lab/test results, encounter notes, upcoming appointments, etc.  Non-urgent  messages can be sent to your provider as well.   To learn more about what you can do with MyChart, go to [email or web address].

## 2020-07-18 LAB — BASIC METABOLIC PANEL
BUN/Creatinine Ratio: 19 (ref 9–20)
BUN: 13 mg/dL (ref 6–24)
CO2: 22 mmol/L (ref 20–29)
Calcium: 9.6 mg/dL (ref 8.7–10.2)
Chloride: 98 mmol/L (ref 96–106)
Creatinine, Ser: 0.68 mg/dL — ABNORMAL LOW (ref 0.76–1.27)
GFR calc Af Amer: 123 mL/min/{1.73_m2} (ref >59)
GFR calc non Af Amer: 107 mL/min/{1.73_m2} (ref >59)
Glucose: 270 mg/dL — ABNORMAL HIGH (ref 65–99)
Potassium: 3.5 mmol/L (ref 3.5–5.2)
Sodium: 140 mmol/L (ref 134–144)

## 2020-07-20 ENCOUNTER — Ambulatory Visit: Payer: Medicaid Other

## 2020-07-20 ENCOUNTER — Other Ambulatory Visit: Payer: Self-pay

## 2020-07-20 ENCOUNTER — Ambulatory Visit: Payer: Medicaid Other | Admitting: Occupational Therapy

## 2020-07-20 DIAGNOSIS — M79642 Pain in left hand: Secondary | ICD-10-CM

## 2020-07-20 DIAGNOSIS — R278 Other lack of coordination: Secondary | ICD-10-CM

## 2020-07-20 DIAGNOSIS — M6281 Muscle weakness (generalized): Secondary | ICD-10-CM

## 2020-07-20 DIAGNOSIS — M25542 Pain in joints of left hand: Secondary | ICD-10-CM

## 2020-07-20 DIAGNOSIS — R2689 Other abnormalities of gait and mobility: Secondary | ICD-10-CM | POA: Diagnosis not present

## 2020-07-20 NOTE — Therapy (Addendum)
[facility] [address] [place], [place], [address] Phone: [phone]   Fax:  [phone]  Occupational Therapy Treatment  Patient Details  Name: [name] MRN: [id number] Date of Birth: [birth year] Referring Provider (OT): [name]   Encounter Date: 07/20/2020   OT End of Session - 07/20/20 1011    Visit Number 3    Number of Visits 25    Date for OT Re-Evaluation 08/17/20    Authorization Type Medicaid Blue - 27 ot/pt/st    Authorization Time Period [employer] has auth 12 visits through 10/06/20    Authorization - Visit Number 2    Authorization - Number of Visits 12    Progress Note Due on Visit 12    OT Start Time 0932    OT Stop Time 1013    OT Time Calculation (min) 41 min    Activity Tolerance Patient tolerated treatment well    Behavior During Therapy [facility] for tasks assessed/performed           Past Medical History:  Diagnosis Date  . Anemia   . Arthritis   . Back pain   . CAD (coronary artery disease)    a. s/p DES to LAD in 05/2016  . Cervical radiculopathy   . Chronic diastolic CHF (congestive heart failure) ([place])   . Chronic pain   . Depression   . DVT (deep venous thrombosis) ([place])   . Hematemesis   . Hepatic steatosis   . Hyperlipidemia   . Hypertension   . IBS (irritable bowel syndrome)   . Morbid obesity ([place])   . OSA (obstructive sleep apnea)   . Pancreatitis   . PE (pulmonary thromboembolism) ([place])   . Peripheral neuropathy   . PUD (peptic ulcer disease)   . Renal disorder   . Stroke [facility])    a. ?details unclear - not seen on imaging when he was admitted in 05/2017 for TIA symptoms which were felt due to cervical radiculopathy.  . Thoracic aortic ectasia (HCC)    a. 4.3cm ectatic ascending thoracic aorta by CT 06/2017.   . Type 2 diabetes mellitus ([place])     Past Surgical History:  Procedure Laterality Date  . CARDIAC CATHETERIZATION N/A 05/31/2016   Procedure:  Left Heart Cath and Coronary Angiography;  Surgeon: Peter M Martinique, MD;  Location: [place] CV LAB;  Service: Cardiovascular;  Laterality: N/A;  . CARDIAC CATHETERIZATION N/A 05/31/2016   Procedure: Intravascular Pressure Wire/FFR Study;  Surgeon: Peter M Martinique, MD;  Location: [place] [facility];  Service: Cardiovascular;  Laterality: N/A;  . CARDIAC CATHETERIZATION N/A 05/31/2016   Procedure: Coronary Stent Intervention;  Surgeon: Peter M Martinique, MD;  Location: [place] CV LAB;  Service: Cardiovascular;  Laterality: N/A;  . COLONOSCOPY WITH PROPOFOL N/A 04/29/2020   Procedure: COLONOSCOPY WITH PROPOFOL;  Surgeon: Wilford Corner, MD;  Location: [facility];  Service: Endoscopy;  Laterality: N/A;  . ESOPHAGOGASTRODUODENOSCOPY N/A 04/29/2020   Procedure: ESOPHAGOGASTRODUODENOSCOPY (EGD);  Surgeon: Wilford Corner, MD;  Location: [facility];  Service: Endoscopy;  Laterality: N/A;  . LEFT HEART CATH AND CORONARY ANGIOGRAPHY N/A 12/08/2017   Procedure: LEFT HEART CATH AND CORONARY ANGIOGRAPHY;  Surgeon: Leonie Man, MD;  Location: [place] CV LAB;  Service: Cardiovascular;  Laterality: N/A;  . LEFT HEART CATHETERIZATION WITH CORONARY ANGIOGRAM N/A 02/03/2014   Procedure: LEFT HEART CATHETERIZATION WITH CORONARY ANGIOGRAM;  Surgeon: Pixie Casino, MD;  Location: [facility] CATH LAB;  Service: Cardiovascular;  Laterality: N/A;  . left leg stent     . POLYPECTOMY  04/29/2020   Procedure: POLYPECTOMY;  Surgeon: Wilford Corner, MD;  Location: [facility] ENDOSCOPY;  Service: Endoscopy;;    There were no vitals filed for this visit.   Subjective Assessment - 07/20/20 1028    Subjective  Pt reports bilateral hand pain    Pertinent History PMH: coronary artery disease, diabetes mellitus type 2 (insulin dependent), diastolic congestive heart failure, depression, hypertension, obesity, neuropathy, recent GI bleed with transfusion, atypical chest pain, 5 CVAs    Limitations fall risk    Patient Stated Goals  Patient stated goals to "get feeling back in my hand and not hurting all the time"    Currently in Pain? Yes    Pain Location Hand    Pain Orientation Right;Left    Pain Descriptors / Indicators Aching    Pain Type Chronic pain    Pain Onset More than a month ago    Pain Frequency Intermittent    Aggravating Factors  malpositioning, overuse    Pain Relieving Factors heat    Pain Onset More than a month ago                                OT Treatment/ Education - 07/20/20 1022    Education Details coordination HEP- see pt instructions, pt was instruced to avoid repetative gripping with LUE for the time being as he reports trigger thumb, edema gloves issued for nightime wear that include fingers. Pt was instucted to wear his thumb brace if thumb is triggering and bothers him.    Person(s) Educated Patient    Methods Explanation;Demonstration    Comprehension Verbalized understanding;Returned demonstration;Verbal cues required            OT Short Term Goals - 07/06/20 1202      OT SHORT TERM GOAL #1   Title Pt will be independent with HEP 08/03/2020    Time 4    Period Weeks    Status New    Target Date 08/03/20      OT SHORT TERM GOAL #2   Title Pt will improve grip strength by 5 lbs in LUE in order to increase independence and ability to complete ADLs and IADLs    Baseline LUE 73.8 lbs    Time 4    Period Weeks    Status New      OT SHORT TERM GOAL #3   Title Pt will improve functional use of LUE as evidenced by performing 3 button test in 45 seconds or less.    Time 4    Period Weeks    Status New      OT SHORT TERM GOAL #4   Title Patient will report pain no more than 5/10 with functional use of LUE    Time 4    Period Weeks    Status New      OT SHORT TERM GOAL #5   Title Pt will improve lateral pinch strength by 3 lbs in order to increase ability to manage fasteners with clothing management.    Baseline LUE 17 lbs    Time 4    Period  Weeks    Status New             OT Long Term Goals - 07/06/20 1215      OT LONG TERM GOAL #1   Title Pt will be  indepedent with updated HEP 08/31/20    Time 6    Period Weeks    Status New    Target Date 08/31/20      OT LONG TERM GOAL #2   Title Pt will improve grip strength by 10 lbs in LUE in order to increase independence and ability to complete ADLs and IADLs    Time 6    Period Weeks    Status New      OT LONG TERM GOAL #3   Title Pt will improve functional use of LUE as evidenced by performing 3 button test in 35 seconds or less.    Time 6    Period Weeks    Status New      OT LONG TERM GOAL #4   Title Patient will report pain no more than 3/10 with functional use of LUE    Time 6    Period Weeks    Status New      OT LONG TERM GOAL #5   Title Patient will report understanding and compliance of orthosis/splint PRN and/or verbalize understanding of proper body mechanics for wrist neutralization.    Time 6    Period Weeks    Status New      OT LONG TERM GOAL #6   Title Pt will improve lateral pinch strength by 6 lbs in order to increase ability to manage fasteners with clothing management.    Baseline LUE 17 lbs    Time 6    Period Weeks    Status New                 Plan - 07/20/20 1013    Clinical Impression Statement Pt is progressing towards goals. Pt brought in his gloves and thumb brace. Pt was issued new gloves for nighttime wear as pt's gloves do not include digits. Pt reports wearing pre-fab thumb brace when thumb is triggering. Pt was instructed that this should be fine and he should avoid repetative gripping with left hand right now.    OT Occupational Profile and History Detailed Assessment- Review of Records and additional review of physical, cognitive, psychosocial history related to current functional performance    Occupational performance deficits (Please refer to evaluation for details): IADL's;ADL's;Leisure;Rest and Sleep;Social  Participation    Body Structure / Function / Physical Skills ADL;Coordination;FMC;Strength;ROM;Body mechanics;IADL;Pain;Sensation;UE functional use;GMC    Rehab Potential Good    Clinical Decision Making Limited treatment options, no task modification necessary    Comorbidities Affecting Occupational Performance: May have comorbidities impacting occupational performance    Modification or Assistance to Complete Evaluation  No modification of tasks or assist necessary to complete eval    OT Frequency 2x / week    OT Duration 6 weeks   or 12 visits + eval over extended time   OT Treatment/Interventions Therapeutic exercise;Self-care/ADL training;Therapeutic activities;Splinting;Ultrasound;Electrical Stimulation;Patient/family education;Moist Heat;Fluidtherapy;DME and/or AE instruction;Manual Therapy;Passive range of motion    Plan continue to work towards goals, futher assess trigger thumb LUE    Consulted and Agree with Plan of Care Patient           Patient will benefit from skilled therapeutic intervention in order to improve the following deficits and impairments:   Body Structure / Function / Physical Skills: ADL, Coordination, FMC, Strength, ROM, Body mechanics, IADL, Pain, Sensation, UE functional use, GMC       Visit Diagnosis: Pain in left hand  Pain in joint of left hand  Other lack of coordination  Muscle weakness (generalized)    Problem List Patient Active Problem List   Diagnosis Date Noted  . Upper GI bleed 04/28/2020  . Hypertensive urgency 04/27/2020  . Acute GI bleeding 04/27/2020  . Mixed diabetic hyperlipidemia associated with type 2 diabetes mellitus ([place]) 04/27/2020  . Chronic diastolic CHF (congestive heart failure) ([place]) 08/23/2019  . ARF (acute renal failure) ([place]) 08/01/2019  . Hyperkalemia 08/01/2019  . Uncontrolled type 2 diabetes mellitus with hyperglycemia ([place]) 08/01/2019  . AKI (acute kidney injury) ([place])   . Hypotension 07/31/2019  . DKA  (diabetic ketoacidoses) ([place]) 05/05/2019  . Blurred vision 05/05/2019  . Alteration consciousness 11/19/2018  . Neck pain 10/09/2018  . Left arm weakness 10/09/2018  . B12 deficiency 08/10/2017  . Persistent headaches 08/09/2017  . GERD (gastroesophageal reflux disease) 06/29/2017  . Left arm numbness   . Cerebral embolism with cerebral infarction 06/17/2017  . TIA (transient ischemic attack) 06/17/2017  . Chronic back pain 12/29/2016  . Depression 12/15/2016  . Vitamin D deficiency 12/09/2016  . Right hip pain 12/07/2016  . Hypokalemia 12/07/2016  . Type 2 diabetes mellitus with vascular disease ([place]) 05/31/2016  . Normocytic normochromic anemia 05/31/2016  . Chest pain 05/31/2016  . Coronary artery disease involving native coronary artery of native heart without angina pectoris 01/06/2016  . DVT (deep venous thrombosis) ([place]) 01/06/2016  . Lactic acidosis 05/28/2014  . Nonspecific chest pain 01/29/2014  . Uncontrolled secondary diabetes with peripheral neuropathy ([place]) 01/29/2014  . Obesity, Class III, BMI 40-49.9 (morbid obesity) ([place]) 01/29/2014  . Snoring 01/29/2014  . Dyslipidemia 01/29/2014  . HTN (hypertension) 01/29/2014  . Abnormal nuclear stress test 01/29/2014    Omer Monter 07/20/2020, 10:31 AM Theone Murdoch, OTR/L [phone] Phone: [phone] 10:31 AM 07/20/20 [facility] [address] [place], [place], [address] Phone: [phone]   Fax:  [phone]  Name: OMRI BERTRAN MRN: [id number] Date of Birth: 1961-01-11

## 2020-07-20 NOTE — Patient Instructions (Addendum)
2 Coordination Activities  Perform the following activities for 20 minutes 1 times per day with both hand(s).   Rotate ball in fingertips (clockwise and counter-clockwise).  Toss ball between hands.  Toss ball in air and catch with the same hand.  Flip cards 1 at a time as fast as you can.  Deal cards with your thumb (Hold deck in hand and push card off top with thumb).  Pick up coins, buttons, marbles, dried beans/pasta of different sizes and place in container.  Pick up coins and place in container or coin bank.  Pick up coins and stack.  Pick up coins one at a time until you get 5-10 in your hand, then move coins from palm to fingertips to stack one at a time.  Practice writing and/or typing.        Wear your gloves at night, remove if any issues. Avoid squeezing stress ball that can aggravate trigger thumb for L hand.

## 2020-07-22 ENCOUNTER — Ambulatory Visit: Payer: Medicaid Other

## 2020-07-23 ENCOUNTER — Ambulatory Visit: Payer: Medicaid Other | Admitting: Occupational Therapy

## 2020-07-23 ENCOUNTER — Encounter: Payer: Self-pay | Admitting: Occupational Therapy

## 2020-07-23 ENCOUNTER — Other Ambulatory Visit: Payer: Self-pay

## 2020-07-23 DIAGNOSIS — M25542 Pain in joints of left hand: Secondary | ICD-10-CM

## 2020-07-23 DIAGNOSIS — R2689 Other abnormalities of gait and mobility: Secondary | ICD-10-CM | POA: Diagnosis not present

## 2020-07-23 DIAGNOSIS — M79642 Pain in left hand: Secondary | ICD-10-CM

## 2020-07-23 DIAGNOSIS — R278 Other lack of coordination: Secondary | ICD-10-CM

## 2020-07-23 DIAGNOSIS — M6281 Muscle weakness (generalized): Secondary | ICD-10-CM

## 2020-07-23 NOTE — Therapy (Signed)
[facility] [address] [place], [place], [address] Phone: [phone]   Fax:  [phone]  Occupational Therapy Treatment  Patient Details  Name: [name] MRN: [id number] Date of Birth: [birth year] Referring Provider (OT): [name]   Encounter Date: 07/23/2020   OT End of Session - 07/23/20 1031    Visit Number 4    Number of Visits 25    Date for OT Re-Evaluation 08/17/20    Authorization Type Medicaid Blue - 27 ot/pt/st    Authorization Time Period Week 2 of 6 (07/23/20) [employer] has auth 12 visits through 10/06/20    Authorization - Visit Number 3    Authorization - Number of Visits 12    Progress Note Due on Visit 12    OT Start Time 1017    OT Stop Time 1058    OT Time Calculation (min) 41 min    Activity Tolerance Patient tolerated treatment well    Behavior During Therapy [facility] for tasks assessed/performed           Past Medical History:  Diagnosis Date  . Anemia   . Arthritis   . Back pain   . CAD (coronary artery disease)    a. s/p DES to LAD in 05/2016  . Cervical radiculopathy   . Chronic diastolic CHF (congestive heart failure) ([place])   . Chronic pain   . Depression   . DVT (deep venous thrombosis) ([place])   . Hematemesis   . Hepatic steatosis   . Hyperlipidemia   . Hypertension   . IBS (irritable bowel syndrome)   . Morbid obesity ([place])   . OSA (obstructive sleep apnea)   . Pancreatitis   . PE (pulmonary thromboembolism) ([place])   . Peripheral neuropathy   . PUD (peptic ulcer disease)   . Renal disorder   . Stroke [facility])    a. ?details unclear - not seen on imaging when he was admitted in 05/2017 for TIA symptoms which were felt due to cervical radiculopathy.  . Thoracic aortic ectasia (HCC)    a. 4.3cm ectatic ascending thoracic aorta by CT 06/2017.   . Type 2 diabetes mellitus ([place])     Past Surgical History:  Procedure Laterality Date  . CARDIAC CATHETERIZATION N/A  05/31/2016   Procedure: Left Heart Cath and Coronary Angiography;  Surgeon: Peter M Martinique, MD;  Location: [place] CV LAB;  Service: Cardiovascular;  Laterality: N/A;  . CARDIAC CATHETERIZATION N/A 05/31/2016   Procedure: Intravascular Pressure Wire/FFR Study;  Surgeon: Peter M Martinique, MD;  Location: [place] CV LAB;  Service: Cardiovascular;  Laterality: N/A;  . CARDIAC CATHETERIZATION N/A 05/31/2016   Procedure: Coronary Stent Intervention;  Surgeon: Peter M Martinique, MD;  Location: [place] CV LAB;  Service: Cardiovascular;  Laterality: N/A;  . COLONOSCOPY WITH PROPOFOL N/A 04/29/2020   Procedure: COLONOSCOPY WITH PROPOFOL;  Surgeon: Wilford Corner, MD;  Location: [place];  Service: Endoscopy;  Laterality: N/A;  . ESOPHAGOGASTRODUODENOSCOPY N/A 04/29/2020   Procedure: ESOPHAGOGASTRODUODENOSCOPY (EGD);  Surgeon: Wilford Corner, MD;  Location: [place];  Service: Endoscopy;  Laterality: N/A;  . LEFT HEART CATH AND CORONARY ANGIOGRAPHY N/A 12/08/2017   Procedure: LEFT HEART CATH AND CORONARY ANGIOGRAPHY;  Surgeon: Leonie Man, MD;  Location: [place] CV LAB;  Service: Cardiovascular;  Laterality: N/A;  . LEFT HEART CATHETERIZATION WITH CORONARY ANGIOGRAM N/A 02/03/2014   Procedure: LEFT HEART CATHETERIZATION WITH CORONARY ANGIOGRAM;  Surgeon: Pixie Casino, MD;  Location: [facility] CATH  LAB;  Service: Cardiovascular;  Laterality: N/A;  . left leg stent     . POLYPECTOMY  04/29/2020   Procedure: POLYPECTOMY;  Surgeon: Wilford Corner, MD;  Location: [facility] ENDOSCOPY;  Service: Endoscopy;;    There were no vitals filed for this visit.   Subjective Assessment - 07/23/20 1019    Subjective  Pt reports pain in his back. Patient reports overall decreased pain in his hands and has been wearing his gloves.    Pertinent History PMH: coronary artery disease, diabetes mellitus type 2 (insulin dependent), diastolic congestive heart failure, depression, hypertension, obesity, neuropathy, recent  GI bleed with transfusion, atypical chest pain, 5 CVAs    Limitations fall risk    Patient Stated Goals Patient stated goals to "get feeling back in my hand and not hurting all the time"    Currently in Pain? No/denies             Treatment:  Fluido BUE 10 min   Tendon Glides BUE, pillow case/trash bag finger walks BUE, Finger Extension, Abduction exercises  Instructed patient to discharge putty exercises d/t possible trigger thumb in LUE  Wrist AROM BUE ext/flexion, RD/UD x 10  Supination/pronation wheel BUE  Grooved Pegboard LUE - 1 drop and no pain reported.  No pain reported in any activities this day.      OT Education - 07/23/20 1031    Education Details reviewed tendon glides. d/c putty exercises due to possible LUE trigger thumb    Person(s) Educated Patient    Methods Explanation;Demonstration    Comprehension Verbalized understanding;Returned demonstration;Verbal cues required            OT Short Term Goals - 07/23/20 1032      OT SHORT TERM GOAL #1   Title Pt will be independent with HEP 08/03/2020    Time 4    Period Weeks    Status On-going    Target Date 08/03/20      OT SHORT TERM GOAL #2   Title Pt will improve grip strength by 5 lbs in LUE in order to increase independence and ability to complete ADLs and IADLs    Baseline LUE 73.8 lbs    Time 4    Period Weeks    Status New      OT SHORT TERM GOAL #3   Title Pt will improve functional use of LUE as evidenced by performing 3 button test in 45 seconds or less.    Time 4    Period Weeks    Status New      OT SHORT TERM GOAL #4   Title Patient will report pain no more than 5/10 with functional use of LUE    Time 4    Period Weeks    Status Achieved   heat has helped LUE and pt reports no pain with functional tasks and in therapy sessions. 07/23/20     OT SHORT TERM GOAL #5   Title Pt will improve lateral pinch strength by 3 lbs in order to increase ability to manage fasteners with  clothing management.    Baseline LUE 17 lbs    Time 4    Period Weeks    Status New             OT Long Term Goals - 07/06/20 1215      OT LONG TERM GOAL #1   Title Pt will be indepedent with updated HEP 08/31/20    Time 6    Period  Weeks    Status New    Target Date 08/31/20      OT LONG TERM GOAL #2   Title Pt will improve grip strength by 10 lbs in LUE in order to increase independence and ability to complete ADLs and IADLs    Time 6    Period Weeks    Status New      OT LONG TERM GOAL #3   Title Pt will improve functional use of LUE as evidenced by performing 3 button test in 35 seconds or less.    Time 6    Period Weeks    Status New      OT LONG TERM GOAL #4   Title Patient will report pain no more than 3/10 with functional use of LUE    Time 6    Period Weeks    Status New      OT LONG TERM GOAL #5   Title Patient will report understanding and compliance of orthosis/splint PRN and/or verbalize understanding of proper body mechanics for wrist neutralization.    Time 6    Period Weeks    Status New      OT LONG TERM GOAL #6   Title Pt will improve lateral pinch strength by 6 lbs in order to increase ability to manage fasteners with clothing management.    Baseline LUE 17 lbs    Time 6    Period Weeks    Status New                 Plan - 07/23/20 1021    Clinical Impression Statement Pt has been wearing his new gloves and reports decreased pain. Overall decrease in pain in bilateral upper extremities. Pt is compliant with HEP and is increasing skills with fine motor coordiantion. Skilled OT continues to be beneficial for pt.    OT Occupational Profile and History Detailed Assessment- Review of Records and additional review of physical, cognitive, psychosocial history related to current functional performance    Occupational performance deficits (Please refer to evaluation for details): IADL's;ADL's;Leisure;Rest and Sleep;Social Participation    Body  Structure / Function / Physical Skills ADL;Coordination;FMC;Strength;ROM;Body mechanics;IADL;Pain;Sensation;UE functional use;GMC    Rehab Potential Good    Clinical Decision Making Limited treatment options, no task modification necessary    Comorbidities Affecting Occupational Performance: May have comorbidities impacting occupational performance    Modification or Assistance to Complete Evaluation  No modification of tasks or assist necessary to complete eval    OT Frequency 2x / week    OT Duration 6 weeks   or 12 visits + eval over extended time   OT Treatment/Interventions Therapeutic exercise;Self-care/ADL training;Therapeutic activities;Splinting;Ultrasound;Electrical Stimulation;Patient/family education;Moist Heat;Fluidtherapy;DME and/or AE instruction;Manual Therapy;Passive range of motion    Plan continue to assess trigger thumb. Fine motor coordination    Consulted and Agree with Plan of Care Patient           Patient will benefit from skilled therapeutic intervention in order to improve the following deficits and impairments:   Body Structure / Function / Physical Skills: ADL, Coordination, FMC, Strength, ROM, Body mechanics, IADL, Pain, Sensation, UE functional use, GMC       Visit Diagnosis: Pain in left hand  Pain in joint of left hand  Other lack of coordination  Muscle weakness (generalized)  Other abnormalities of gait and mobility    Problem List Patient Active Problem List   Diagnosis Date Noted  . Upper GI bleed 04/28/2020  . Hypertensive  urgency 04/27/2020  . Acute GI bleeding 04/27/2020  . Mixed diabetic hyperlipidemia associated with type 2 diabetes mellitus ([place]) 04/27/2020  . Chronic diastolic CHF (congestive heart failure) ([place]) 08/23/2019  . ARF (acute renal failure) ([place]) 08/01/2019  . Hyperkalemia 08/01/2019  . Uncontrolled type 2 diabetes mellitus with hyperglycemia ([place]) 08/01/2019  . AKI (acute kidney injury) ([place])   . Hypotension  07/31/2019  . DKA (diabetic ketoacidoses) ([place]) 05/05/2019  . Blurred vision 05/05/2019  . Alteration consciousness 11/19/2018  . Neck pain 10/09/2018  . Left arm weakness 10/09/2018  . B12 deficiency 08/10/2017  . Persistent headaches 08/09/2017  . GERD (gastroesophageal reflux disease) 06/29/2017  . Left arm numbness   . Cerebral embolism with cerebral infarction 06/17/2017  . TIA (transient ischemic attack) 06/17/2017  . Chronic back pain 12/29/2016  . Depression 12/15/2016  . Vitamin D deficiency 12/09/2016  . Right hip pain 12/07/2016  . Hypokalemia 12/07/2016  . Type 2 diabetes mellitus with vascular disease ([place]) 05/31/2016  . Normocytic normochromic anemia 05/31/2016  . Chest pain 05/31/2016  . Coronary artery disease involving native coronary artery of native heart without angina pectoris 01/06/2016  . DVT (deep venous thrombosis) ([place]) 01/06/2016  . Lactic acidosis 05/28/2014  . Nonspecific chest pain 01/29/2014  . Uncontrolled secondary diabetes with peripheral neuropathy ([place]) 01/29/2014  . Obesity, Class III, BMI 40-49.9 (morbid obesity) ([place]) 01/29/2014  . Snoring 01/29/2014  . Dyslipidemia 01/29/2014  . HTN (hypertension) 01/29/2014  . Abnormal nuclear stress test 01/29/2014    Zachery Conch MOT, OTR/L  07/23/2020, 11:09 AM  [facility] [address] [place], [place], [address] Phone: [phone]   Fax:  [phone]  Name: EMMIT ORILEY MRN: [id number] Date of Birth: [birth year]

## 2020-07-23 NOTE — Patient Instructions (Signed)
Flexor Tendon Gliding (Active Hook Fist)   With fingers and knuckles straight, bend middle and tip joints. Do not bend large knuckles. Repeat _10-15___ times. Do _4-6___ sessions per day.  MP Flexion (Active)   With back of hand on table, bend large knuckles as far as they will go, keeping small joints straight. Repeat _10-15___ times. Do __4-6__ sessions per day. Activity: Reach into a narrow container.*      Finger Flexion / Extension   With palm up, bend fingers of left hand toward palm, making a  fist. Straighten fingers, opening fist. Repeat sequence _10-15___ times per session. Do _4-6__ sessions per day. Hand Variation: Palm down   Copyright  VHI. All rights reserved.   

## 2020-07-27 ENCOUNTER — Ambulatory Visit: Payer: Medicaid Other | Attending: Family Medicine | Admitting: Occupational Therapy

## 2020-07-27 ENCOUNTER — Ambulatory Visit: Payer: Medicaid Other

## 2020-07-27 ENCOUNTER — Other Ambulatory Visit: Payer: Self-pay

## 2020-07-27 ENCOUNTER — Encounter: Payer: Self-pay | Admitting: Occupational Therapy

## 2020-07-27 DIAGNOSIS — M79642 Pain in left hand: Secondary | ICD-10-CM | POA: Diagnosis not present

## 2020-07-27 DIAGNOSIS — M6281 Muscle weakness (generalized): Secondary | ICD-10-CM

## 2020-07-27 DIAGNOSIS — R278 Other lack of coordination: Secondary | ICD-10-CM | POA: Diagnosis not present

## 2020-07-27 DIAGNOSIS — M25542 Pain in joints of left hand: Secondary | ICD-10-CM | POA: Diagnosis not present

## 2020-07-27 NOTE — Patient Instructions (Signed)
Opposition (Active) ° ° °Touch tip of thumb to nail tip of each finger in turn, making an "O" shape. °Repeat __10__ times. Do _4-6___ sessions per day. ° ° °MP Flexion (Active) ° ° °Bend thumb to touch base of little finger, keeping tip joint straight. °Repeat __10-15__ times. Do _4-6___ sessions per day. ° ° ° ° ° ° °IP Flexion (Active Blocked) ° ° °Brace thumb below tip joint. Bend joint as far as possible. °Repeat __10__ times. Do _4-6___ sessions per day. ° ° °Composite Extension (Active) ° ° °Bring thumb up and out in hitchhiker position.  °Repeat __10-15__ times. Do _4-6___ sessions per day. ° °  °

## 2020-07-27 NOTE — Therapy (Signed)
[facility] [address] [place], [place], [address] Phone: [phone]   Fax:  [phone]  Occupational Therapy Treatment  Patient Details  Name: [name] MRN: [id number] Date of Birth: 03-03-1961 Referring Provider (OT): [name]   Encounter Date: 07/27/2020   OT End of Session - 07/27/20 1114    Visit Number 5    Number of Visits 13    Date for OT Re-Evaluation 08/17/20    Authorization Type Medicaid Blue - 27 ot/pt/st    Authorization Time Period Week 2 of 6 (07/23/20) [employer] has auth 12 visits through 10/06/20    Authorization - Visit Number 5    Authorization - Number of Visits 12    Progress Note Due on Visit 12    OT Start Time 1102    OT Stop Time 1145    OT Time Calculation (min) 43 min    Activity Tolerance Patient tolerated treatment well    Behavior During Therapy [facility] for tasks assessed/performed           Past Medical History:  Diagnosis Date  . Anemia   . Arthritis   . Back pain   . CAD (coronary artery disease)    a. s/p DES to LAD in 05/2016  . Cervical radiculopathy   . Chronic diastolic CHF (congestive heart failure) ([place])   . Chronic pain   . Depression   . DVT (deep venous thrombosis) ([place])   . Hematemesis   . Hepatic steatosis   . Hyperlipidemia   . Hypertension   . IBS (irritable bowel syndrome)   . Morbid obesity ([place])   . OSA (obstructive sleep apnea)   . Pancreatitis   . PE (pulmonary thromboembolism) ([place])   . Peripheral neuropathy   . PUD (peptic ulcer disease)   . Renal disorder   . Stroke [facility])    a. ?details unclear - not seen on imaging when he was admitted in 05/2017 for TIA symptoms which were felt due to cervical radiculopathy.  . Thoracic aortic ectasia (HCC)    a. 4.3cm ectatic ascending thoracic aorta by CT 06/2017.   . Type 2 diabetes mellitus ([place])     Past Surgical History:  Procedure Laterality Date  . CARDIAC CATHETERIZATION N/A  05/31/2016   Procedure: Left Heart Cath and Coronary Angiography;  Surgeon: Peter M Martinique, MD;  Location: [place] CV LAB;  Service: Cardiovascular;  Laterality: N/A;  . CARDIAC CATHETERIZATION N/A 05/31/2016   Procedure: Intravascular Pressure Wire/FFR Study;  Surgeon: Peter M Martinique, MD;  Location: [place] CV LAB;  Service: Cardiovascular;  Laterality: N/A;  . CARDIAC CATHETERIZATION N/A 05/31/2016   Procedure: Coronary Stent Intervention;  Surgeon: Peter M Martinique, MD;  Location: [place] CV LAB;  Service: Cardiovascular;  Laterality: N/A;  . COLONOSCOPY WITH PROPOFOL N/A 04/29/2020   Procedure: COLONOSCOPY WITH PROPOFOL;  Surgeon: Wilford Corner, MD;  Location: [place];  Service: Endoscopy;  Laterality: N/A;  . ESOPHAGOGASTRODUODENOSCOPY N/A 04/29/2020   Procedure: ESOPHAGOGASTRODUODENOSCOPY (EGD);  Surgeon: Wilford Corner, MD;  Location: [place];  Service: Endoscopy;  Laterality: N/A;  . LEFT HEART CATH AND CORONARY ANGIOGRAPHY N/A 12/08/2017   Procedure: LEFT HEART CATH AND CORONARY ANGIOGRAPHY;  Surgeon: Leonie Man, MD;  Location: [place] CV LAB;  Service: Cardiovascular;  Laterality: N/A;  . LEFT HEART CATHETERIZATION WITH CORONARY ANGIOGRAM N/A 02/03/2014   Procedure: LEFT HEART CATHETERIZATION WITH CORONARY ANGIOGRAM;  Surgeon: Pixie Casino, MD;  Location: [facility] CATH  LAB;  Service: Cardiovascular;  Laterality: N/A;  . left leg stent     . POLYPECTOMY  04/29/2020   Procedure: POLYPECTOMY;  Surgeon: Wilford Corner, MD;  Location: [facility] ENDOSCOPY;  Service: Endoscopy;;    There were no vitals filed for this visit.   Subjective Assessment - 07/27/20 1105    Subjective  Pt came in saying he was in a lot of pain due to the weather today.    Pertinent History PMH: coronary artery disease, diabetes mellitus type 2 (insulin dependent), diastolic congestive heart failure, depression, hypertension, obesity, neuropathy, recent GI bleed with transfusion, atypical chest  pain, 5 CVAs    Limitations fall risk    Patient Stated Goals Patient stated goals to "get feeling back in my hand and not hurting all the time"    Currently in Pain? Yes    Pain Score 8     Pain Location Hand    Pain Orientation Left;Right    Pain Descriptors / Indicators Stabbing;Aching    Pain Type Chronic pain    Pain Onset More than a month ago    Pain Frequency Constant    Aggravating Factors  weather    Pain Relieving Factors heat                 Treatment:  Fluido BUE x 12 minutes. 8/10 pain in BUE hands prior to [place]. 3/10 pain post Fluido in BUE  AROM for LUE thumb - see pt instructions  Manual self massage to thenar region and web space of LUE   [place] activities with BUE - picking up pennies, stacking and translating to fingertips, rotating dice    Edema Management techniques. Patient would benefit from continued education        OT Education - 07/27/20 1113    Education Details issued AROM exercises for LUE thumb - see pt instructions    Person(s) Educated Patient    Methods Explanation;Demonstration    Comprehension Verbalized understanding;Returned demonstration;Verbal cues required            OT Short Term Goals - 07/27/20 1143      OT SHORT TERM GOAL #1   Title Pt will be independent with HEP 08/03/2020    Time 4    Period Weeks    Status On-going    Target Date 08/03/20      OT SHORT TERM GOAL #2   Title Pt will improve grip strength by 5 lbs in LUE in order to increase independence and ability to complete ADLs and IADLs    Baseline LUE 73.8 lbs    Time 4    Period Weeks    Status On-going   67.6 lbs 07/27/20 - discouraging gripping secondary to potential trigger thumb     OT SHORT TERM GOAL #3   Title Pt will improve functional use of LUE as evidenced by performing 3 button test in 45 seconds or less.    Time 4    Period Weeks    Status New      OT SHORT TERM GOAL #4   Title Patient will report pain no more than 5/10 with  functional use of LUE    Time 4    Period Weeks    Status Achieved   heat has helped LUE and pt reports no pain with functional tasks and in therapy sessions. 07/23/20     OT SHORT TERM GOAL #5   Title Pt will improve lateral pinch strength by 3 lbs in  order to increase ability to manage fasteners with clothing management.    Baseline LUE 17 lbs    Time 4    Period Weeks    Status New             OT Long Term Goals - 07/06/20 1215      OT LONG TERM GOAL #1   Title Pt will be indepedent with updated HEP 08/31/20    Time 6    Period Weeks    Status New    Target Date 08/31/20      OT LONG TERM GOAL #2   Title Pt will improve grip strength by 10 lbs in LUE in order to increase independence and ability to complete ADLs and IADLs    Time 6    Period Weeks    Status New      OT LONG TERM GOAL #3   Title Pt will improve functional use of LUE as evidenced by performing 3 button test in 35 seconds or less.    Time 6    Period Weeks    Status New      OT LONG TERM GOAL #4   Title Patient will report pain no more than 3/10 with functional use of LUE    Time 6    Period Weeks    Status New      OT LONG TERM GOAL #5   Title Patient will report understanding and compliance of orthosis/splint PRN and/or verbalize understanding of proper body mechanics for wrist neutralization.    Time 6    Period Weeks    Status New      OT LONG TERM GOAL #6   Title Pt will improve lateral pinch strength by 6 lbs in order to increase ability to manage fasteners with clothing management.    Baseline LUE 17 lbs    Time 6    Period Weeks    Status New                 Plan - 07/27/20 1110    Clinical Impression Statement Potential trigger thumb injury to LUE - working on increasing active range of motion and splint wear to decrease symptoms.    OT Occupational Profile and History Detailed Assessment- Review of Records and additional review of physical, cognitive, psychosocial history  related to current functional performance    Occupational performance deficits (Please refer to evaluation for details): IADL's;ADL's;Leisure;Rest and Sleep;Social Participation    Body Structure / Function / Physical Skills ADL;Coordination;FMC;Strength;ROM;Body mechanics;IADL;Pain;Sensation;UE functional use;GMC    Rehab Potential Good    Clinical Decision Making Limited treatment options, no task modification necessary    Comorbidities Affecting Occupational Performance: May have comorbidities impacting occupational performance    Modification or Assistance to Complete Evaluation  No modification of tasks or assist necessary to complete eval    OT Frequency 2x / week    OT Duration 6 weeks   or 12 visits + eval over extended time   OT Treatment/Interventions Therapeutic exercise;Self-care/ADL training;Therapeutic activities;Splinting;Ultrasound;Electrical Stimulation;Patient/family education;Moist Heat;Fluidtherapy;DME and/or AE instruction;Manual Therapy;Passive range of motion    Plan AROM for BUE. review HEP for thumb 07/27/20 note. Fine motor coordination    Consulted and Agree with Plan of Care Patient           Patient will benefit from skilled therapeutic intervention in order to improve the following deficits and impairments:   Body Structure / Function / Physical Skills: ADL, Coordination, FMC, Strength, ROM, Body mechanics,  IADL, Pain, Sensation, UE functional use, GMC       Visit Diagnosis: Other lack of coordination  Pain in left hand  Pain in joint of left hand  Muscle weakness (generalized)    Problem List Patient Active Problem List   Diagnosis Date Noted  . Upper GI bleed 04/28/2020  . Hypertensive urgency 04/27/2020  . Acute GI bleeding 04/27/2020  . Mixed diabetic hyperlipidemia associated with type 2 diabetes mellitus ([place]) 04/27/2020  . Chronic diastolic CHF (congestive heart failure) ([place]) 08/23/2019  . ARF (acute renal failure) ([place]) 08/01/2019  .  Hyperkalemia 08/01/2019  . Uncontrolled type 2 diabetes mellitus with hyperglycemia ([place]) 08/01/2019  . AKI (acute kidney injury) ([place])   . Hypotension 07/31/2019  . DKA (diabetic ketoacidoses) 05/05/2019  . Blurred vision 05/05/2019  . Alteration consciousness 11/19/2018  . Neck pain 10/09/2018  . Left arm weakness 10/09/2018  . B12 deficiency 08/10/2017  . Persistent headaches 08/09/2017  . GERD (gastroesophageal reflux disease) 06/29/2017  . Left arm numbness   . Cerebral embolism with cerebral infarction 06/17/2017  . TIA (transient ischemic attack) 06/17/2017  . Chronic back pain 12/29/2016  . Depression 12/15/2016  . Vitamin D deficiency 12/09/2016  . Right hip pain 12/07/2016  . Hypokalemia 12/07/2016  . Type 2 diabetes mellitus with vascular disease ([place]) 05/31/2016  . Normocytic normochromic anemia 05/31/2016  . Chest pain 05/31/2016  . Coronary artery disease involving native coronary artery of native heart without angina pectoris 01/06/2016  . DVT (deep venous thrombosis) ([place]) 01/06/2016  . Lactic acidosis 05/28/2014  . Nonspecific chest pain 01/29/2014  . Uncontrolled secondary diabetes with peripheral neuropathy ([place]) 01/29/2014  . Obesity, Class III, BMI 40-49.9 (morbid obesity) ([place]) 01/29/2014  . Snoring 01/29/2014  . Dyslipidemia 01/29/2014  . HTN (hypertension) 01/29/2014  . Abnormal nuclear stress test 01/29/2014    Zachery Conch MOT, OTR/L  07/27/2020, 11:44 AM  [facility] [address] [place], [place], [address] Phone: [phone]   Fax:  [phone]  Name: ZAVION SLEIGHT MRN: [id number] Date of Birth: [birth year]

## 2020-07-28 DIAGNOSIS — R600 Localized edema: Secondary | ICD-10-CM | POA: Insufficient documentation

## 2020-07-28 DIAGNOSIS — E1142 Type 2 diabetes mellitus with diabetic polyneuropathy: Secondary | ICD-10-CM | POA: Insufficient documentation

## 2020-07-28 DIAGNOSIS — I6523 Occlusion and stenosis of bilateral carotid arteries: Secondary | ICD-10-CM | POA: Insufficient documentation

## 2020-07-28 DIAGNOSIS — M51369 Other intervertebral disc degeneration, lumbar region without mention of lumbar back pain or lower extremity pain: Secondary | ICD-10-CM | POA: Insufficient documentation

## 2020-07-28 DIAGNOSIS — M5136 Other intervertebral disc degeneration, lumbar region: Secondary | ICD-10-CM | POA: Insufficient documentation

## 2020-07-28 DIAGNOSIS — I509 Heart failure, unspecified: Secondary | ICD-10-CM | POA: Insufficient documentation

## 2020-07-28 DIAGNOSIS — K580 Irritable bowel syndrome with diarrhea: Secondary | ICD-10-CM | POA: Insufficient documentation

## 2020-07-28 DIAGNOSIS — K760 Fatty (change of) liver, not elsewhere classified: Secondary | ICD-10-CM | POA: Insufficient documentation

## 2020-07-28 DIAGNOSIS — M1611 Unilateral primary osteoarthritis, right hip: Secondary | ICD-10-CM | POA: Insufficient documentation

## 2020-07-29 ENCOUNTER — Other Ambulatory Visit: Payer: Self-pay

## 2020-07-29 ENCOUNTER — Ambulatory Visit: Payer: Medicaid Other | Admitting: Occupational Therapy

## 2020-07-29 ENCOUNTER — Ambulatory Visit: Payer: Medicaid Other

## 2020-07-29 ENCOUNTER — Encounter: Payer: Self-pay | Admitting: Occupational Therapy

## 2020-07-29 DIAGNOSIS — M79642 Pain in left hand: Secondary | ICD-10-CM | POA: Diagnosis not present

## 2020-07-29 DIAGNOSIS — M6281 Muscle weakness (generalized): Secondary | ICD-10-CM

## 2020-07-29 DIAGNOSIS — R278 Other lack of coordination: Secondary | ICD-10-CM

## 2020-07-29 DIAGNOSIS — M25542 Pain in joints of left hand: Secondary | ICD-10-CM

## 2020-07-29 NOTE — Therapy (Signed)
[facility] [address] [place], [place], [address] Phone: [phone]   Fax:  [phone]  Occupational Therapy Treatment  Patient Details  Name: [name] MRN: [id number] Date of Birth: 15-Apr-1961 Referring Provider (OT): [name]   Encounter Date: 07/29/2020   OT End of Session - 07/29/20 1104    Visit Number 6    Number of Visits 13    Date for OT Re-Evaluation 08/17/20    Authorization Type Medicaid Blue - 27 ot/pt/st    Authorization Time Period Week 3 of 6 (07/29/20) [employer] has auth 12 visits through 10/06/20    Authorization - Visit Number 6    Authorization - Number of Visits 12    Progress Note Due on Visit 12    OT Start Time 1104    OT Stop Time 1145    OT Time Calculation (min) 41 min    Activity Tolerance Patient tolerated treatment well    Behavior During Therapy [facility] for tasks assessed/performed           Past Medical History:  Diagnosis Date  . Anemia   . Arthritis   . Back pain   . CAD (coronary artery disease)    a. s/p DES to LAD in 05/2016  . Cervical radiculopathy   . Chronic diastolic CHF (congestive heart failure) ([place])   . Chronic pain   . Depression   . DVT (deep venous thrombosis) ([place])   . Hematemesis   . Hepatic steatosis   . Hyperlipidemia   . Hypertension   . IBS (irritable bowel syndrome)   . Morbid obesity ([place])   . OSA (obstructive sleep apnea)   . Pancreatitis   . PE (pulmonary thromboembolism) ([place])   . Peripheral neuropathy   . PUD (peptic ulcer disease)   . Renal disorder   . Stroke [facility])    a. ?details unclear - not seen on imaging when he was admitted in 05/2017 for TIA symptoms which were felt due to cervical radiculopathy.  . Thoracic aortic ectasia (HCC)    a. 4.3cm ectatic ascending thoracic aorta by CT 06/2017.   . Type 2 diabetes mellitus ([place])     Past Surgical History:  Procedure Laterality Date  . CARDIAC CATHETERIZATION N/A  05/31/2016   Procedure: Left Heart Cath and Coronary Angiography;  Surgeon: Peter M Martinique, MD;  Location: [place] CV LAB;  Service: Cardiovascular;  Laterality: N/A;  . CARDIAC CATHETERIZATION N/A 05/31/2016   Procedure: Intravascular Pressure Wire/FFR Study;  Surgeon: Peter M Martinique, MD;  Location: [place] CV LAB;  Service: Cardiovascular;  Laterality: N/A;  . CARDIAC CATHETERIZATION N/A 05/31/2016   Procedure: Coronary Stent Intervention;  Surgeon: Peter M Martinique, MD;  Location: [place] CV LAB;  Service: Cardiovascular;  Laterality: N/A;  . COLONOSCOPY WITH PROPOFOL N/A 04/29/2020   Procedure: COLONOSCOPY WITH PROPOFOL;  Surgeon: Wilford Corner, MD;  Location: [place];  Service: Endoscopy;  Laterality: N/A;  . ESOPHAGOGASTRODUODENOSCOPY N/A 04/29/2020   Procedure: ESOPHAGOGASTRODUODENOSCOPY (EGD);  Surgeon: Wilford Corner, MD;  Location: [place];  Service: Endoscopy;  Laterality: N/A;  . LEFT HEART CATH AND CORONARY ANGIOGRAPHY N/A 12/08/2017   Procedure: LEFT HEART CATH AND CORONARY ANGIOGRAPHY;  Surgeon: Leonie Man, MD;  Location: [place] CV LAB;  Service: Cardiovascular;  Laterality: N/A;  . LEFT HEART CATHETERIZATION WITH CORONARY ANGIOGRAM N/A 02/03/2014   Procedure: LEFT HEART CATHETERIZATION WITH CORONARY ANGIOGRAM;  Surgeon: Pixie Casino, MD;  Location: [facility] CATH  LAB;  Service: Cardiovascular;  Laterality: N/A;  . left leg stent     . POLYPECTOMY  04/29/2020   Procedure: POLYPECTOMY;  Surgeon: Wilford Corner, MD;  Location: [facility] ENDOSCOPY;  Service: Endoscopy;;    There were no vitals filed for this visit.   Subjective Assessment - 07/29/20 1105    Subjective  Feeling "better than I have been".    Pertinent History PMH: coronary artery disease, diabetes mellitus type 2 (insulin dependent), diastolic congestive heart failure, depression, hypertension, obesity, neuropathy, recent GI bleed with transfusion, atypical chest pain, 5 CVAs    Limitations fall  risk    Patient Stated Goals Patient stated goals to "get feeling back in my hand and not hurting all the time"    Currently in Pain? Yes    Pain Score 5     Pain Location Hand    Pain Orientation Right;Left    Pain Descriptors / Indicators Aching;Stabbing    Pain Type Chronic pain    Pain Onset More than a month ago    Pain Frequency Constant    Pain Relieving Factors heat               Treatment:  Lateral Pinch tested - see STG Number Dice - BUE Fluido x 10 min BUE hands and wrists Issued Thumb Spica Wrist brace for LUE XL  Semi Circle cylindrical pegs - placed with LUE with brace on with no triggering; removed with RUE Placing large pegs with LUE with brace on.  See education provided.   OT Education - 07/29/20 1125    Education Details Education provided on minimizing and limiting overuse of hands with gripping and using tools etc and wearing splint/orthoses that pt already has. issued new wrist/thumb spica for L. discharged thumb exercises involving MP flexion    Person(s) Educated Patient    Methods Explanation    Comprehension Verbalized understanding;Verbal cues required;Need further instruction            OT Short Term Goals - 07/29/20 1111      OT SHORT TERM GOAL #1   Title Pt will be independent with HEP 08/03/2020    Time 4    Period Weeks    Status On-going    Target Date 08/03/20      OT SHORT TERM GOAL #2   Title Pt will improve grip strength by 5 lbs in LUE in order to increase independence and ability to complete ADLs and IADLs    Baseline LUE 73.8 lbs    Time 4    Period Weeks    Status On-going   67.6 lbs 07/27/20 - discouraging gripping secondary to potential trigger thumb     OT SHORT TERM GOAL #3   Title Pt will improve functional use of LUE as evidenced by performing 3 button test in 45 seconds or less.    Time 4    Period Weeks    Status New      OT SHORT TERM GOAL #4   Title Patient will report pain no more than 5/10 with  functional use of LUE    Time 4    Period Weeks    Status Achieved   heat has helped LUE and pt reports no pain with functional tasks and in therapy sessions. 07/23/20     OT SHORT TERM GOAL #5   Title Pt will improve lateral pinch strength by 3 lbs in order to increase ability to manage fasteners with clothing management.  Baseline LUE 17 lbs    Time 4    Period Weeks    Status On-going   19lbs 10/6            OT Long Term Goals - 07/06/20 1215      OT LONG TERM GOAL #1   Title Pt will be indepedent with updated HEP 08/31/20    Time 6    Period Weeks    Status New    Target Date 08/31/20      OT LONG TERM GOAL #2   Title Pt will improve grip strength by 10 lbs in LUE in order to increase independence and ability to complete ADLs and IADLs    Time 6    Period Weeks    Status New      OT LONG TERM GOAL #3   Title Pt will improve functional use of LUE as evidenced by performing 3 button test in 35 seconds or less.    Time 6    Period Weeks    Status New      OT LONG TERM GOAL #4   Title Patient will report pain no more than 3/10 with functional use of LUE    Time 6    Period Weeks    Status New      OT LONG TERM GOAL #5   Title Patient will report understanding and compliance of orthosis/splint PRN and/or verbalize understanding of proper body mechanics for wrist neutralization.    Time 6    Period Weeks    Status New      OT LONG TERM GOAL #6   Title Pt will improve lateral pinch strength by 6 lbs in order to increase ability to manage fasteners with clothing management.    Baseline LUE 17 lbs    Time 6    Period Weeks    Status New                 Plan - 07/29/20 1106    Clinical Impression Statement Pt reported decreased episodes of trigger thumb in LUE and improvement with use of edema gloves. Will look into trigger thumb orthosis and/or splint    OT Occupational Profile and History Detailed Assessment- Review of Records and additional review of  physical, cognitive, psychosocial history related to current functional performance    Occupational performance deficits (Please refer to evaluation for details): IADL's;ADL's;Leisure;Rest and Sleep;Social Participation    Body Structure / Function / Physical Skills ADL;Coordination;FMC;Strength;ROM;Body mechanics;IADL;Pain;Sensation;UE functional use;GMC    Rehab Potential Good    Clinical Decision Making Limited treatment options, no task modification necessary    Comorbidities Affecting Occupational Performance: May have comorbidities impacting occupational performance    Modification or Assistance to Complete Evaluation  No modification of tasks or assist necessary to complete eval    OT Frequency 2x / week    OT Duration 6 weeks   or 12 visits + eval over extended time   OT Treatment/Interventions Therapeutic exercise;Self-care/ADL training;Therapeutic activities;Splinting;Ultrasound;Electrical Stimulation;Patient/family education;Moist Heat;Fluidtherapy;DME and/or AE instruction;Manual Therapy;Passive range of motion    Plan AROM for BUE. review HEP for thumb 07/27/20 note. Fine motor coordination    Consulted and Agree with Plan of Care Patient           Patient will benefit from skilled therapeutic intervention in order to improve the following deficits and impairments:   Body Structure / Function / Physical Skills: ADL, Coordination, FMC, Strength, ROM, Body mechanics, IADL, Pain, Sensation, UE functional use,  [place]       Visit Diagnosis: Other lack of coordination  Pain in left hand  Pain in joint of left hand  Muscle weakness (generalized)    Problem List Patient Active Problem List   Diagnosis Date Noted  . Upper GI bleed 04/28/2020  . Hypertensive urgency 04/27/2020  . Acute GI bleeding 04/27/2020  . Mixed diabetic hyperlipidemia associated with type 2 diabetes mellitus ([place]) 04/27/2020  . Chronic diastolic CHF (congestive heart failure) ([place]) 08/23/2019  . ARF  (acute renal failure) ([place]) 08/01/2019  . Hyperkalemia 08/01/2019  . Uncontrolled type 2 diabetes mellitus with hyperglycemia ([place]) 08/01/2019  . AKI (acute kidney injury) ([place])   . Hypotension 07/31/2019  . DKA (diabetic ketoacidoses) 05/05/2019  . Blurred vision 05/05/2019  . Alteration consciousness 11/19/2018  . Neck pain 10/09/2018  . Left arm weakness 10/09/2018  . B12 deficiency 08/10/2017  . Persistent headaches 08/09/2017  . GERD (gastroesophageal reflux disease) 06/29/2017  . Left arm numbness   . Cerebral embolism with cerebral infarction 06/17/2017  . TIA (transient ischemic attack) 06/17/2017  . Chronic back pain 12/29/2016  . Depression 12/15/2016  . Vitamin D deficiency 12/09/2016  . Right hip pain 12/07/2016  . Hypokalemia 12/07/2016  . Type 2 diabetes mellitus with vascular disease ([place]) 05/31/2016  . Normocytic normochromic anemia 05/31/2016  . Chest pain 05/31/2016  . Coronary artery disease involving native coronary artery of native heart without angina pectoris 01/06/2016  . DVT (deep venous thrombosis) ([place]) 01/06/2016  . Lactic acidosis 05/28/2014  . Nonspecific chest pain 01/29/2014  . Uncontrolled secondary diabetes with peripheral neuropathy ([place]) 01/29/2014  . Obesity, Class III, BMI 40-49.9 (morbid obesity) ([place]) 01/29/2014  . Snoring 01/29/2014  . Dyslipidemia 01/29/2014  . HTN (hypertension) 01/29/2014  . Abnormal nuclear stress test 01/29/2014    Zachery Conch MOT, OTR/L  07/29/2020, 3:50 PM  [facility] [address] [place], [place], [address] Phone: [phone]   Fax:  [phone]  Name: KRISTY CATOE MRN: [id number] Date of Birth: Mar 09, 1961

## 2020-08-03 ENCOUNTER — Other Ambulatory Visit: Payer: Self-pay

## 2020-08-03 ENCOUNTER — Ambulatory Visit: Payer: Medicaid Other | Admitting: Occupational Therapy

## 2020-08-03 ENCOUNTER — Encounter: Payer: Self-pay | Admitting: Occupational Therapy

## 2020-08-03 DIAGNOSIS — M79642 Pain in left hand: Secondary | ICD-10-CM | POA: Diagnosis not present

## 2020-08-03 DIAGNOSIS — M6281 Muscle weakness (generalized): Secondary | ICD-10-CM

## 2020-08-03 DIAGNOSIS — M25542 Pain in joints of left hand: Secondary | ICD-10-CM

## 2020-08-03 DIAGNOSIS — R278 Other lack of coordination: Secondary | ICD-10-CM

## 2020-08-03 NOTE — Therapy (Signed)
[name] [address] [place], [place], [address] Phone: [phone]   Fax:  [phone]  Occupational Therapy Treatment  Patient Details  Name: [name] MRN: [id number] Date of Birth: June 04, 1961 Referring Provider (OT): [name]   Encounter Date: 08/03/2020   OT End of Session - 08/03/20 1252    Visit Number 7    Number of Visits 13    Date for OT Re-Evaluation 08/17/20    Authorization Type [employer] - 27 ot/pt/st    Authorization Time Period Week 4 of 6 (08/03/20) [employer] has auth 12 visits through 10/06/20    Authorization - Visit Number 7    Authorization - Number of Visits 12    Progress Note Due on Visit 12    OT Start Time 1232    OT Stop Time 1315    OT Time Calculation (min) 43 min    Activity Tolerance Patient tolerated treatment well    Behavior During Therapy [facility] for tasks assessed/performed           Past Medical History:  Diagnosis Date  . Anemia   . Arthritis   . Back pain   . CAD (coronary artery disease)    a. s/p DES to LAD in 05/2016  . Cervical radiculopathy   . Chronic diastolic CHF (congestive heart failure) ([place])   . Chronic pain   . Depression   . DVT (deep venous thrombosis) ([place])   . Hematemesis   . Hepatic steatosis   . Hyperlipidemia   . Hypertension   . IBS (irritable bowel syndrome)   . Morbid obesity ([place])   . OSA (obstructive sleep apnea)   . Pancreatitis   . PE (pulmonary thromboembolism) ([place])   . Peripheral neuropathy   . PUD (peptic ulcer disease)   . Renal disorder   . Stroke [facility])    a. ?details unclear - not seen on imaging when he was admitted in 05/2017 for TIA symptoms which were felt due to cervical radiculopathy.  . Thoracic aortic ectasia (H[facility])    a. 4.3cm ectatic ascending thoracic aorta by CT 06/2017.   . Type 2 diabetes mellitus ([place])     Past Surgical History:  Procedure Laterality Date  . CARDIAC CATHETERIZATION N/A  05/31/2016   Procedure: Left Heart Cath and Coronary Angiography;  Surgeon: Peter M Martinique, MD;  Location: [facility];  Service: Cardiovascular;  Laterality: N/A;  . CARDIAC CATHETERIZATION N/A 05/31/2016   Procedure: Intravascular Pressure Wire/FFR Study;  Surgeon: Peter M Martinique, MD;  Location: [facility];  Service: Cardiovascular;  Laterality: N/A;  . CARDIAC CATHETERIZATION N/A 05/31/2016   Procedure: Coronary Stent Intervention;  Surgeon: Peter M Martinique, MD;  Location: [place] [facility];  Service: Cardiovascular;  Laterality: N/A;  . COLONOSCOPY WITH PROPOFOL N/A 04/29/2020   Procedure: COLONOSCOPY WITH PROPOFOL;  Surgeon: Wilford Corner, MD;  Location: [facility];  Service: Endoscopy;  Laterality: N/A;  . ESOPHAGOGASTRODUODENOSCOPY N/A 04/29/2020   Procedure: ESOPHAGOGASTRODUODENOSCOPY (EGD);  Surgeon: Wilford Corner, MD;  Location: [facility];  Service: Endoscopy;  Laterality: N/A;  . LEFT HEART CATH AND CORONARY ANGIOGRAPHY N/A 12/08/2017   Procedure: LEFT HEART CATH AND CORONARY ANGIOGRAPHY;  Surgeon: Leonie Man, MD;  Location: [place] [facility];  Service: Cardiovascular;  Laterality: N/A;  . LEFT HEART CATHETERIZATION WITH CORONARY ANGIOGRAM N/A 02/03/2014   Procedure: LEFT HEART CATHETERIZATION WITH CORONARY ANGIOGRAM;  Surgeon: Pixie Casino, MD;  Location: [facility] CATH  LAB;  Service: Cardiovascular;  Laterality: N/A;  . left leg stent     . POLYPECTOMY  04/29/2020   Procedure: POLYPECTOMY;  Surgeon: Wilford Corner, MD;  Location: [facility] ENDOSCOPY;  Service: Endoscopy;;    There were no vitals filed for this visit.   Subjective Assessment - 08/03/20 1236    Subjective  Pt reports heating hands prior to therapy and they feel better. Pt reports new brace is working out well for him and it is helping tremedously with his trigger thumb and LUE.    Pertinent History PMH: coronary artery disease, diabetes mellitus type 2 (insulin dependent), diastolic congestive heart  failure, depression, hypertension, obesity, neuropathy, recent GI bleed with transfusion, atypical chest pain, 5 CVAs    Limitations fall risk    Patient Stated Goals Patient stated goals to "get feeling back in my hand and not hurting all the time"    Currently in Pain? Yes    Pain Score 4     Pain Location Hand    Pain Orientation Left;Right    Pain Descriptors / Indicators Aching;Stabbing    Pain Type Chronic pain    Pain Onset More than a month ago    Pain Frequency Constant    Pain Relieving Factors heat             Treatment:   Fluido x 12 minutes BUE wrist/hands 3 button test in 35.78 seconds. STG met Resistance clothespins with LUE lateral pinch 1-8# Small pegs and manipulation of small objects with BUE with no difficulty and no drops     OT Education - 08/03/20 1311    Education Details issued information on contrast bath - further review suggested. see pt instructions    Person(s) Educated Patient    Methods Explanation    Comprehension Verbalized understanding;Verbal cues required;Need further instruction            OT Short Term Goals - 08/03/20 1251      OT SHORT TERM GOAL #1   Title Pt will be independent with HEP 08/03/2020    Time 4    Period Weeks    Status Achieved    Target Date 08/03/20      OT SHORT TERM GOAL #2   Title Pt will improve grip strength by 5 lbs in LUE in order to increase independence and ability to complete ADLs and IADLs    Baseline LUE 73.8 lbs    Time 4    Period Weeks    Status Achieved   80.6 grip strength in LUE 0/10 pain. 10/11     OT SHORT TERM GOAL #3   Title Pt will improve functional use of LUE as evidenced by performing 3 button test in 45 seconds or less.    Time 4    Period Weeks    Status Achieved   10/11 - 35.78 sec     OT SHORT TERM GOAL #4   Title Patient will report pain no more than 5/10 with functional use of LUE    Time 4    Period Weeks    Status Achieved   heat has helped LUE and pt reports no  pain with functional tasks and in therapy sessions. 07/23/20     OT SHORT TERM GOAL #5   Title Pt will improve lateral pinch strength by 3 lbs in order to increase ability to manage fasteners with clothing management.    Baseline LUE 17 lbs    Time 4  Period Weeks    Status On-going   19lbs 10/6            OT Long Term Goals - 07/06/20 1215      OT LONG TERM GOAL #1   Title Pt will be indepedent with updated HEP 08/31/20    Time 6    Period Weeks    Status New    Target Date 08/31/20      OT LONG TERM GOAL #2   Title Pt will improve grip strength by 10 lbs in LUE in order to increase independence and ability to complete ADLs and IADLs    Time 6    Period Weeks    Status New      OT LONG TERM GOAL #3   Title Pt will improve functional use of LUE as evidenced by performing 3 button test in 35 seconds or less.    Time 6    Period Weeks    Status New      OT LONG TERM GOAL #4   Title Patient will report pain no more than 3/10 with functional use of LUE    Time 6    Period Weeks    Status New      OT LONG TERM GOAL #5   Title Patient will report understanding and compliance of orthosis/splint PRN and/or verbalize understanding of proper body mechanics for wrist neutralization.    Time 6    Period Weeks    Status New      OT LONG TERM GOAL #6   Title Pt will improve lateral pinch strength by 6 lbs in order to increase ability to manage fasteners with clothing management.    Baseline LUE 17 lbs    Time 6    Period Weeks    Status New                 Plan - 08/03/20 1303    Clinical Impression Statement Pt tolerating more activities with LUE and reporting improvement with use of new orthosis for trigger thumb.    OT Occupational Profile and History Detailed Assessment- Review of Records and additional review of physical, cognitive, psychosocial history related to current functional performance    Occupational performance deficits (Please refer to evaluation  for details): IADL's;ADL's;Leisure;Rest and Sleep;Social Participation    Body Structure / Function / Physical Skills ADL;Coordination;FMC;Strength;ROM;Body mechanics;IADL;Pain;Sensation;UE functional use;GMC    Rehab Potential Good    Clinical Decision Making Limited treatment options, no task modification necessary    Comorbidities Affecting Occupational Performance: May have comorbidities impacting occupational performance    Modification or Assistance to Complete Evaluation  No modification of tasks or assist necessary to complete eval    OT Frequency 2x / week    OT Duration 6 weeks   or 12 visits + eval over extended time   OT Treatment/Interventions Therapeutic exercise;Self-care/ADL training;Therapeutic activities;Splinting;Ultrasound;Electrical Stimulation;Patient/family education;Moist Heat;Fluidtherapy;DME and/or AE instruction;Manual Therapy;Passive range of motion    Plan Fluido, lateral pinch strengthening LUE.    Consulted and Agree with Plan of Care Patient           Patient will benefit from skilled therapeutic intervention in order to improve the following deficits and impairments:   Body Structure / Function / Physical Skills: ADL, Coordination, FMC, Strength, ROM, Body mechanics, IADL, Pain, Sensation, UE functional use, GMC       Visit Diagnosis: Other lack of coordination  Pain in left hand  Pain in joint of left hand  Muscle weakness (generalized)    Problem List Patient Active Problem List   Diagnosis Date Noted  . Upper GI bleed 04/28/2020  . Hypertensive urgency 04/27/2020  . Acute GI bleeding 04/27/2020  . Mixed diabetic hyperlipidemia associated with type 2 diabetes mellitus ([place]) 04/27/2020  . Chronic diastolic CHF (congestive heart failure) ([place]) 08/23/2019  . ARF (acute renal failure) ([place]) 08/01/2019  . Hyperkalemia 08/01/2019  . Uncontrolled type 2 diabetes mellitus with hyperglycemia ([place]) 08/01/2019  . AKI (acute kidney injury) ([place])   .  Hypotension 07/31/2019  . DKA (diabetic ketoacidoses) 05/05/2019  . Blurred vision 05/05/2019  . Alteration consciousness 11/19/2018  . Neck pain 10/09/2018  . Left arm weakness 10/09/2018  . B12 deficiency 08/10/2017  . Persistent headaches 08/09/2017  . GERD (gastroesophageal reflux disease) 06/29/2017  . Left arm numbness   . Cerebral embolism with cerebral infarction 06/17/2017  . TIA (transient ischemic attack) 06/17/2017  . Chronic back pain 12/29/2016  . Depression 12/15/2016  . Vitamin D deficiency 12/09/2016  . Right hip pain 12/07/2016  . Hypokalemia 12/07/2016  . Type 2 diabetes mellitus with vascular disease ([place]) 05/31/2016  . Normocytic normochromic anemia 05/31/2016  . Chest pain 05/31/2016  . Coronary artery disease involving native coronary artery of native heart without angina pectoris 01/06/2016  . DVT (deep venous thrombosis) ([place]) 01/06/2016  . Lactic acidosis 05/28/2014  . Nonspecific chest pain 01/29/2014  . Uncontrolled secondary diabetes with peripheral neuropathy ([place]) 01/29/2014  . Obesity, Class III, BMI 40-49.9 (morbid obesity) ([place]) 01/29/2014  . Snoring 01/29/2014  . Dyslipidemia 01/29/2014  . HTN (hypertension) 01/29/2014  . Abnormal nuclear stress test 01/29/2014    Zachery Conch MOT, OTR/L  08/03/2020, 2:22 PM  [facility] [address] [place], [place], [address] Phone: [phone]   Fax:  [phone]  Name: TORRENCE HAMMACK MRN: [id number] Date of Birth: 1960-12-03

## 2020-08-03 NOTE — Patient Instructions (Addendum)
Contrast Bath Prepare the baths.  Cold = 55-65 degrees     Hot = 105-110 degrees  Starting with the hot, dip hand or foot all the way into the water and hold there for selected duration.  Preferably 3 minutes.  After selected duration is up, dip hand or foot into the cold for 1/3 duration of the hot. (3 minutes hot, 1 minute cold)  Alternate back and forth for the times indicated for no more than a total of 20 minutes ending with hot.  Ice Massage and Cold Packs Home Program  Ice and cold packs are used so that we can return the muscle to it's natural resting state without causing more pain, which can lead to more spasm, etc.  Icing and cold packs are also used to reduce swelling, which can lead to pain and stiffness.  COLD PACKS Cold packs should be placed circumferentially around the swollen area (ie., the wrist, etc.).  A paper towel can be placed over the area before the cold pack is applied.  A towel may be wrapped around the outside of the cold pack to keep the cold in.  The swollen area should be elevated with the cold pack, if possible.  ICE MASSAGE Fill a 4 ounce paper cup three-quarters full and put it in a freezer until it is frozen.  When ready to use, tear off about 1 inch of the cup so that some of the ice is showing while the bottom of the cup can be used to hold onto. Massage the entire muscle area as instructed by your therapist.  You may use circular or up and down strokes, but do not hold the ice in one spot.  Four phases to the ice massage and cold pack application: 1. Cold: which you feel when you first apply the ice. 2. Ache: after a few minutes 3. Burning: after approximately five minutes, it will feel like your skin is burning.  At this point, remove the ice for a minute or so. 4. Numbness: THIS IS THE CRUCIAL PHASE!!! Return the ice or cold pack and massage until all the burning disappears.  This signals the end of cryotherapy.  The entire procedure should take ten  to fifteen minutes while using the cold pack.  Do Not perform the ice massage for more than seven minutes on a small area or more than ten minutes on a large area.  Cryotherapy should be performed after exercises, when edema occurs, or when an area is painful.

## 2020-08-05 ENCOUNTER — Ambulatory Visit: Payer: Medicaid Other | Admitting: Occupational Therapy

## 2020-08-10 ENCOUNTER — Ambulatory Visit: Payer: Medicaid Other | Admitting: Occupational Therapy

## 2020-08-10 ENCOUNTER — Encounter: Payer: Self-pay | Admitting: Occupational Therapy

## 2020-08-10 ENCOUNTER — Other Ambulatory Visit: Payer: Self-pay

## 2020-08-10 DIAGNOSIS — M79642 Pain in left hand: Secondary | ICD-10-CM | POA: Diagnosis not present

## 2020-08-10 DIAGNOSIS — R278 Other lack of coordination: Secondary | ICD-10-CM

## 2020-08-10 DIAGNOSIS — M6281 Muscle weakness (generalized): Secondary | ICD-10-CM

## 2020-08-10 DIAGNOSIS — M25542 Pain in joints of left hand: Secondary | ICD-10-CM

## 2020-08-10 DIAGNOSIS — R2681 Unsteadiness on feet: Secondary | ICD-10-CM

## 2020-08-10 NOTE — Therapy (Signed)
[facility] [address] [place], [place], [address] Phone: [phone]   Fax:  [phone]  Occupational Therapy Treatment  Patient Details  Name: [name] MRN: [id number] Date of Birth: 1961-09-15 Referring Provider (OT): [name]   Encounter Date: 08/10/2020   OT End of Session - 08/10/20 1104    Visit Number 8    Number of Visits 13    Date for OT Re-Evaluation 08/17/20    Authorization Type Medicaid Blue - 27 ot/pt/st    Authorization Time Period Week 5 of 6 (08/10/20) [employer] has auth 12 visits through 10/06/20    Authorization - Visit Number 8    Authorization - Number of Visits 12    Progress Note Due on Visit 12    OT Start Time 1101    OT Stop Time 1144    OT Time Calculation (min) 43 min    Activity Tolerance Patient tolerated treatment well    Behavior During Therapy [facility] for tasks assessed/performed           Past Medical History:  Diagnosis Date  . Anemia   . Arthritis   . Back pain   . CAD (coronary artery disease)    a. s/p DES to LAD in 05/2016  . Cervical radiculopathy   . Chronic diastolic CHF (congestive heart failure) ([place])   . Chronic pain   . Depression   . DVT (deep venous thrombosis) ([place])   . Hematemesis   . Hepatic steatosis   . Hyperlipidemia   . Hypertension   . IBS (irritable bowel syndrome)   . Morbid obesity ([place])   . OSA (obstructive sleep apnea)   . Pancreatitis   . PE (pulmonary thromboembolism) ([place])   . Peripheral neuropathy   . PUD (peptic ulcer disease)   . Renal disorder   . Stroke [facility])    a. ?details unclear - not seen on imaging when he was admitted in 05/2017 for TIA symptoms which were felt due to cervical radiculopathy.  . Thoracic aortic ectasia (HCC)    a. 4.3cm ectatic ascending thoracic aorta by CT 06/2017.   . Type 2 diabetes mellitus ([place])     Past Surgical History:  Procedure Laterality Date  . CARDIAC CATHETERIZATION N/A  05/31/2016   Procedure: Left Heart Cath and Coronary Angiography;  Surgeon: Peter M Martinique, MD;  Location: [place] CV LAB;  Service: Cardiovascular;  Laterality: N/A;  . CARDIAC CATHETERIZATION N/A 05/31/2016   Procedure: Intravascular Pressure Wire/FFR Study;  Surgeon: Peter M Martinique, MD;  Location: [place] CV LAB;  Service: Cardiovascular;  Laterality: N/A;  . CARDIAC CATHETERIZATION N/A 05/31/2016   Procedure: Coronary Stent Intervention;  Surgeon: Peter M Martinique, MD;  Location: [place] CV LAB;  Service: Cardiovascular;  Laterality: N/A;  . COLONOSCOPY WITH PROPOFOL N/A 04/29/2020   Procedure: COLONOSCOPY WITH PROPOFOL;  Surgeon: Wilford Corner, MD;  Location: [place];  Service: Endoscopy;  Laterality: N/A;  . ESOPHAGOGASTRODUODENOSCOPY N/A 04/29/2020   Procedure: ESOPHAGOGASTRODUODENOSCOPY (EGD);  Surgeon: Wilford Corner, MD;  Location: [place];  Service: Endoscopy;  Laterality: N/A;  . LEFT HEART CATH AND CORONARY ANGIOGRAPHY N/A 12/08/2017   Procedure: LEFT HEART CATH AND CORONARY ANGIOGRAPHY;  Surgeon: Leonie Man, MD;  Location: [place] CV LAB;  Service: Cardiovascular;  Laterality: N/A;  . LEFT HEART CATHETERIZATION WITH CORONARY ANGIOGRAM N/A 02/03/2014   Procedure: LEFT HEART CATHETERIZATION WITH CORONARY ANGIOGRAM;  Surgeon: Pixie Casino, MD;  Location: [facility] CATH  LAB;  Service: Cardiovascular;  Laterality: N/A;  . left leg stent     . POLYPECTOMY  04/29/2020   Procedure: POLYPECTOMY;  Surgeon: Wilford Corner, MD;  Location: [facility] ENDOSCOPY;  Service: Endoscopy;;    There were no vitals filed for this visit.   Subjective Assessment - 08/10/20 1103    Subjective  "The change in weather isn't good for my pain". Pt reports 7/10 pain in the hands and 8/10 pain "all over" Pt reporting increased numbness in his ulnar innervation in BUE. Upon evaluation, pt was reporting numbness along median nerve innervation.    Pertinent History PMH: coronary artery disease,  diabetes mellitus type 2 (insulin dependent), diastolic congestive heart failure, depression, hypertension, obesity, neuropathy, recent GI bleed with transfusion, atypical chest pain, 5 CVAs    Limitations fall risk    Patient Stated Goals Patient stated goals to "get feeling back in my hand and not hurting all the time"    Currently in Pain? Yes    Pain Score 7     Pain Location Hand    Pain Orientation Left;Right    Pain Descriptors / Indicators Aching;Stabbing    Pain Type Chronic pain    Pain Onset More than a month ago    Pain Frequency Constant                        OT Treatments/Exercises (OP) - 08/10/20 1106      Exercises   Exercises Hand      Wrist Exercises   Other wrist exercises AROM sup/pro, flex/ext LUE x 10      Hand Exercises   MCPJ Flexion Limitations Tendon Glides BUE 10    Hand Gripper with Large Beads Level 1 - 1 inch blocks with mod verbal and visual cues for maintaining MP joint of thumb extended LUE; 2nd set with RUE    Other Hand Exercises reviewed HEP for AROM of LUE     Other Hand Exercises lateral pinch with blue and black resistance clothespins pincking up jacks with LUE      RUE Fluidotherapy   Number Minutes Fluidotherapy 12 Minutes    RUE Fluidotherapy Location Hand;Wrist      LUE Fluidotherapy   Number Minutes Fluidotherapy 12 Minutes    LUE Fluidotherapy Location Hand;Wrist      Splinting   Splinting reviewed wear scheduling for braces, etc. pt reports going well      Fine Motor Coordination (Hand/Wrist)   Fine Motor Coordination In hand manipuation training;Manipulation of small objects    In Hand Manipulation Training pennies - translation to fingertips from palm    Manipulation of small objects pennies - stacking                    OT Short Term Goals - 08/03/20 1251      OT SHORT TERM GOAL #1   Title Pt will be independent with HEP 08/03/2020    Time 4    Period Weeks    Status Achieved    Target Date  08/03/20      OT SHORT TERM GOAL #2   Title Pt will improve grip strength by 5 lbs in LUE in order to increase independence and ability to complete ADLs and IADLs    Baseline LUE 73.8 lbs    Time 4    Period Weeks    Status Achieved   80.6 grip strength in LUE 0/10 pain. 10/11  OT SHORT TERM GOAL #3   Title Pt will improve functional use of LUE as evidenced by performing 3 button test in 45 seconds or less.    Time 4    Period Weeks    Status Achieved   10/11 - 35.78 sec     OT SHORT TERM GOAL #4   Title Patient will report pain no more than 5/10 with functional use of LUE    Time 4    Period Weeks    Status Achieved   heat has helped LUE and pt reports no pain with functional tasks and in therapy sessions. 07/23/20     OT SHORT TERM GOAL #5   Title Pt will improve lateral pinch strength by 3 lbs in order to increase ability to manage fasteners with clothing management.    Baseline LUE 17 lbs    Time 4    Period Weeks    Status On-going   19lbs 10/6            OT Long Term Goals - 07/06/20 1215      OT LONG TERM GOAL #1   Title Pt will be indepedent with updated HEP 08/31/20    Time 6    Period Weeks    Status New    Target Date 08/31/20      OT LONG TERM GOAL #2   Title Pt will improve grip strength by 10 lbs in LUE in order to increase independence and ability to complete ADLs and IADLs    Time 6    Period Weeks    Status New      OT LONG TERM GOAL #3   Title Pt will improve functional use of LUE as evidenced by performing 3 button test in 35 seconds or less.    Time 6    Period Weeks    Status New      OT LONG TERM GOAL #4   Title Patient will report pain no more than 3/10 with functional use of LUE    Time 6    Period Weeks    Status New      OT LONG TERM GOAL #5   Title Patient will report understanding and compliance of orthosis/splint PRN and/or verbalize understanding of proper body mechanics for wrist neutralization.    Time 6    Period Weeks      Status New      OT LONG TERM GOAL #6   Title Pt will improve lateral pinch strength by 6 lbs in order to increase ability to manage fasteners with clothing management.    Baseline LUE 17 lbs    Time 6    Period Weeks    Status New                 Plan - 08/10/20 1103    Clinical Impression Statement Pt continues to experience pain in bilateral hands. Pt reports decrease in pain since start of care. Pt continues to progress towards goals but limited with complex pain and sensation deficits in BUE    OT Occupational Profile and History Detailed Assessment- Review of Records and additional review of physical, cognitive, psychosocial history related to current functional performance    Occupational performance deficits (Please refer to evaluation for details): IADL's;ADL's;Leisure;Rest and Sleep;Social Participation    Body Structure / Function / Physical Skills ADL;Coordination;FMC;Strength;ROM;Body mechanics;IADL;Pain;Sensation;UE functional use;GMC    Rehab Potential Good    Clinical Decision Making Limited treatment options, no task modification  necessary    Comorbidities Affecting Occupational Performance: May have comorbidities impacting occupational performance    Modification or Assistance to Complete Evaluation  No modification of tasks or assist necessary to complete eval    OT Frequency 2x / week    OT Duration 6 weeks   or 12 visits + eval over extended time   OT Treatment/Interventions Therapeutic exercise;Self-care/ADL training;Therapeutic activities;Splinting;Ultrasound;Electrical Stimulation;Patient/family education;Moist Heat;Fluidtherapy;DME and/or AE instruction;Manual Therapy;Passive range of motion    Plan fluido, AROM in LUE    Consulted and Agree with Plan of Care Patient           Patient will benefit from skilled therapeutic intervention in order to improve the following deficits and impairments:   Body Structure / Function / Physical Skills: ADL,  Coordination, FMC, Strength, ROM, Body mechanics, IADL, Pain, Sensation, UE functional use, GMC       Visit Diagnosis: Unsteadiness on feet  Muscle weakness (generalized)  Pain in left hand  Pain in joint of left hand  Other lack of coordination    Problem List Patient Active Problem List   Diagnosis Date Noted  . Upper GI bleed 04/28/2020  . Hypertensive urgency 04/27/2020  . Acute GI bleeding 04/27/2020  . Mixed diabetic hyperlipidemia associated with type 2 diabetes mellitus ([place]) 04/27/2020  . Chronic diastolic CHF (congestive heart failure) ([place]) 08/23/2019  . ARF (acute renal failure) ([place]) 08/01/2019  . Hyperkalemia 08/01/2019  . Uncontrolled type 2 diabetes mellitus with hyperglycemia ([place]) 08/01/2019  . AKI (acute kidney injury) ([place])   . Hypotension 07/31/2019  . DKA (diabetic ketoacidoses) 05/05/2019  . Blurred vision 05/05/2019  . Alteration consciousness 11/19/2018  . Neck pain 10/09/2018  . Left arm weakness 10/09/2018  . B12 deficiency 08/10/2017  . Persistent headaches 08/09/2017  . GERD (gastroesophageal reflux disease) 06/29/2017  . Left arm numbness   . Cerebral embolism with cerebral infarction 06/17/2017  . TIA (transient ischemic attack) 06/17/2017  . Chronic back pain 12/29/2016  . Depression 12/15/2016  . Vitamin D deficiency 12/09/2016  . Right hip pain 12/07/2016  . Hypokalemia 12/07/2016  . Type 2 diabetes mellitus with vascular disease ([place]) 05/31/2016  . Normocytic normochromic anemia 05/31/2016  . Chest pain 05/31/2016  . Coronary artery disease involving native coronary artery of native heart without angina pectoris 01/06/2016  . DVT (deep venous thrombosis) ([place]) 01/06/2016  . Lactic acidosis 05/28/2014  . Nonspecific chest pain 01/29/2014  . Uncontrolled secondary diabetes with peripheral neuropathy ([place]) 01/29/2014  . Obesity, Class III, BMI 40-49.9 (morbid obesity) ([place]) 01/29/2014  . Snoring 01/29/2014  . Dyslipidemia  01/29/2014  . HTN (hypertension) 01/29/2014  . Abnormal nuclear stress test 01/29/2014    Zachery Conch MOT, OTR/L  08/10/2020, 11:44 AM  [facility] [address] [place], [place], [address] Phone: [phone]   Fax:  [phone]  Name: [name] MRN: [id number] Date of Birth: 26-Nov-1960

## 2020-08-12 ENCOUNTER — Other Ambulatory Visit: Payer: Self-pay

## 2020-08-12 ENCOUNTER — Ambulatory Visit: Payer: Medicaid Other | Admitting: Occupational Therapy

## 2020-08-12 ENCOUNTER — Encounter: Payer: Self-pay | Admitting: Occupational Therapy

## 2020-08-12 DIAGNOSIS — R2681 Unsteadiness on feet: Secondary | ICD-10-CM

## 2020-08-12 DIAGNOSIS — M25542 Pain in joints of left hand: Secondary | ICD-10-CM

## 2020-08-12 DIAGNOSIS — M79642 Pain in left hand: Secondary | ICD-10-CM

## 2020-08-12 DIAGNOSIS — R278 Other lack of coordination: Secondary | ICD-10-CM

## 2020-08-12 NOTE — Therapy (Signed)
[facility] [address] [place], [place], [address] Phone: [phone]   Fax:  [phone]  Occupational Therapy Treatment  Patient Details  Name: [name] MRN: [id number] Date of Birth: 28-Jan-1961 Referring Provider (OT): [name]   Encounter Date: 08/12/2020   OT End of Session - 08/12/20 1106    Visit Number 9    Number of Visits 13    Date for OT Re-Evaluation 08/17/20    Authorization Type [employer] - 27 ot/pt/st    Authorization Time Period Week 5 of 6 (08/10/20) [employer] has auth 12 visits through 10/06/20    Authorization - Visit Number 9    Authorization - Number of Visits 12    Progress Note Due on Visit 12    OT Start Time 1103    OT Stop Time 1141    OT Time Calculation (min) 38 min    Activity Tolerance Patient tolerated treatment well    Behavior During Therapy [facility] for tasks assessed/performed           Past Medical History:  Diagnosis Date  . Anemia   . Arthritis   . Back pain   . CAD (coronary artery disease)    a. s/p DES to LAD in 05/2016  . Cervical radiculopathy   . Chronic diastolic CHF (congestive heart failure) ([place])   . Chronic pain   . Depression   . DVT (deep venous thrombosis) ([place])   . Hematemesis   . Hepatic steatosis   . Hyperlipidemia   . Hypertension   . IBS (irritable bowel syndrome)   . Morbid obesity ([place])   . OSA (obstructive sleep apnea)   . Pancreatitis   . PE (pulmonary thromboembolism) ([place])   . Peripheral neuropathy   . PUD (peptic ulcer disease)   . Renal disorder   . Stroke [facility])    a. ?details unclear - not seen on imaging when he was admitted in 05/2017 for TIA symptoms which were felt due to cervical radiculopathy.  . Thoracic aortic ectasia (HCC)    a. 4.3cm ectatic ascending thoracic aorta by CT 06/2017.   . Type 2 diabetes mellitus ([place])     Past Surgical History:  Procedure Laterality Date  . CARDIAC CATHETERIZATION N/A  05/31/2016   Procedure: Left Heart Cath and Coronary Angiography;  Surgeon: Peter M Martinique, MD;  Location: [place] CV LAB;  Service: Cardiovascular;  Laterality: N/A;  . CARDIAC CATHETERIZATION N/A 05/31/2016   Procedure: Intravascular Pressure Wire/FFR Study;  Surgeon: Peter M Martinique, MD;  Location: [place] CV LAB;  Service: Cardiovascular;  Laterality: N/A;  . CARDIAC CATHETERIZATION N/A 05/31/2016   Procedure: Coronary Stent Intervention;  Surgeon: Peter M Martinique, MD;  Location: [place] CV LAB;  Service: Cardiovascular;  Laterality: N/A;  . COLONOSCOPY WITH PROPOFOL N/A 04/29/2020   Procedure: COLONOSCOPY WITH PROPOFOL;  Surgeon: Wilford Corner, MD;  Location: [place];  Service: Endoscopy;  Laterality: N/A;  . ESOPHAGOGASTRODUODENOSCOPY N/A 04/29/2020   Procedure: ESOPHAGOGASTRODUODENOSCOPY (EGD);  Surgeon: Wilford Corner, MD;  Location: [place];  Service: Endoscopy;  Laterality: N/A;  . LEFT HEART CATH AND CORONARY ANGIOGRAPHY N/A 12/08/2017   Procedure: LEFT HEART CATH AND CORONARY ANGIOGRAPHY;  Surgeon: Leonie Man, MD;  Location: [place] CV LAB;  Service: Cardiovascular;  Laterality: N/A;  . LEFT HEART CATHETERIZATION WITH CORONARY ANGIOGRAM N/A 02/03/2014   Procedure: LEFT HEART CATHETERIZATION WITH CORONARY ANGIOGRAM;  Surgeon: Pixie Casino, MD;  Location: [facility] CATH  LAB;  Service: Cardiovascular;  Laterality: N/A;  . left leg stent     . POLYPECTOMY  04/29/2020   Procedure: POLYPECTOMY;  Surgeon: Wilford Corner, MD;  Location: [facility] ENDOSCOPY;  Service: Endoscopy;;    There were no vitals filed for this visit.   Subjective Assessment - 08/12/20 1106    Subjective  "Hands aren't too bad today. I did a lot of stuff that helped with the pain last night." Pt reports 4/10 pain today in hands. Pt reports the gloves and brace are working really well for him and is reporting a decrease in triggering of the thumb in LUE.    Pertinent History PMH: coronary artery  disease, diabetes mellitus type 2 (insulin dependent), diastolic congestive heart failure, depression, hypertension, obesity, neuropathy, recent GI bleed with transfusion, atypical chest pain, 5 CVAs    Limitations fall risk    Patient Stated Goals Patient stated goals to "get feeling back in my hand and not hurting all the time"    Currently in Pain? Yes    Pain Score 4     Pain Location Hand    Pain Orientation Left;Right    Pain Descriptors / Indicators Aching    Pain Type Chronic pain    Pain Onset More than a month ago    Pain Frequency Constant                        OT Treatments/Exercises (OP) - 08/12/20 1108      Elbow Exercises   Other elbow exercises supination/pronation wheel BUE    Other elbow exercises Forearm Gym BUE      Hand Exercises   Other Hand Exercises tendon glides BUE      Modalities   Modalities Fluidotherapy      RUE Fluidotherapy   Number Minutes Fluidotherapy 10 Minutes    RUE Fluidotherapy Location Hand      LUE Fluidotherapy   Number Minutes Fluidotherapy 10 Minutes    LUE Fluidotherapy Location Hand      Fine Motor Coordination (Hand/Wrist)   Fine Motor Coordination Nuts and Bolts    Nuts and Bolts BUE                   OT Education - 08/12/20 1140    Education Details reviewed discharge plan and plan of care.    Person(s) Educated Patient    Methods Explanation    Comprehension Verbalized understanding            OT Short Term Goals - 08/03/20 1251      OT SHORT TERM GOAL #1   Title Pt will be independent with HEP 08/03/2020    Time 4    Period Weeks    Status Achieved    Target Date 08/03/20      OT SHORT TERM GOAL #2   Title Pt will improve grip strength by 5 lbs in LUE in order to increase independence and ability to complete ADLs and IADLs    Baseline LUE 73.8 lbs    Time 4    Period Weeks    Status Achieved   80.6 grip strength in LUE 0/10 pain. 10/11     OT SHORT TERM GOAL #3   Title Pt will  improve functional use of LUE as evidenced by performing 3 button test in 45 seconds or less.    Time 4    Period Weeks    Status Achieved   10/11 -  35.78 sec     OT SHORT TERM GOAL #4   Title Patient will report pain no more than 5/10 with functional use of LUE    Time 4    Period Weeks    Status Achieved   heat has helped LUE and pt reports no pain with functional tasks and in therapy sessions. 07/23/20     OT SHORT TERM GOAL #5   Title Pt will improve lateral pinch strength by 3 lbs in order to increase ability to manage fasteners with clothing management.    Baseline LUE 17 lbs    Time 4    Period Weeks    Status On-going   19lbs 10/6            OT Long Term Goals - 08/12/20 1124      OT LONG TERM GOAL #1   Title Pt will be indepedent with updated HEP 08/31/20    Time 6    Period Weeks    Status On-going      OT LONG TERM GOAL #2   Title Pt will improve grip strength by 10 lbs in LUE in order to increase independence and ability to complete ADLs and IADLs    Time 6    Period Weeks    Status New      OT LONG TERM GOAL #3   Title Pt will improve functional use of LUE as evidenced by performing 3 button test in 35 seconds or less.    Time 6    Period Weeks    Status New      OT LONG TERM GOAL #4   Title Patient will report pain no more than 3/10 with functional use of LUE    Time 6    Period Weeks    Status On-going      OT LONG TERM GOAL #5   Title Patient will report understanding and compliance of orthosis/splint PRN and/or verbalize understanding of proper body mechanics for wrist neutralization.    Time 6    Period Weeks    Status Achieved      OT LONG TERM GOAL #6   Title Pt will improve lateral pinch strength by 6 lbs in order to increase ability to manage fasteners with clothing management.    Baseline LUE 17 lbs    Time 6    Period Weeks    Status New                 Plan - 08/12/20 1118    Clinical Impression Statement Pt with improved  pain today. Pt continues to benefit from OT to target pain, range of motion and comfort with braces and splints    OT Occupational Profile and History Detailed Assessment- Review of Records and additional review of physical, cognitive, psychosocial history related to current functional performance    Occupational performance deficits (Please refer to evaluation for details): IADL's;ADL's;Leisure;Rest and Sleep;Social Participation    Body Structure / Function / Physical Skills ADL;Coordination;FMC;Strength;ROM;Body mechanics;IADL;Pain;Sensation;UE functional use;GMC    Rehab Potential Good    Clinical Decision Making Limited treatment options, no task modification necessary    Comorbidities Affecting Occupational Performance: May have comorbidities impacting occupational performance    Modification or Assistance to Complete Evaluation  No modification of tasks or assist necessary to complete eval    OT Frequency 2x / week    OT Duration 6 weeks   or 12 visits + eval over extended time   OT Treatment/Interventions  Therapeutic exercise;Self-care/ADL training;Therapeutic activities;Splinting;Ultrasound;Electrical Stimulation;Patient/family education;Moist Heat;Fluidtherapy;DME and/or AE instruction;Manual Therapy;Passive range of motion    Plan fluido, ROM    Consulted and Agree with Plan of Care Patient           Patient will benefit from skilled therapeutic intervention in order to improve the following deficits and impairments:   Body Structure / Function / Physical Skills: ADL, Coordination, FMC, Strength, ROM, Body mechanics, IADL, Pain, Sensation, UE functional use, GMC       Visit Diagnosis: Unsteadiness on feet  Pain in left hand  Pain in joint of left hand  Other lack of coordination    Problem List Patient Active Problem List   Diagnosis Date Noted  . Upper GI bleed 04/28/2020  . Hypertensive urgency 04/27/2020  . Acute GI bleeding 04/27/2020  . Mixed diabetic  hyperlipidemia associated with type 2 diabetes mellitus ([place]) 04/27/2020  . Chronic diastolic CHF (congestive heart failure) ([place]) 08/23/2019  . ARF (acute renal failure) ([place]) 08/01/2019  . Hyperkalemia 08/01/2019  . Uncontrolled type 2 diabetes mellitus with hyperglycemia ([place]) 08/01/2019  . AKI (acute kidney injury) ([place])   . Hypotension 07/31/2019  . DKA (diabetic ketoacidoses) 05/05/2019  . Blurred vision 05/05/2019  . Alteration consciousness 11/19/2018  . Neck pain 10/09/2018  . Left arm weakness 10/09/2018  . B12 deficiency 08/10/2017  . Persistent headaches 08/09/2017  . GERD (gastroesophageal reflux disease) 06/29/2017  . Left arm numbness   . Cerebral embolism with cerebral infarction 06/17/2017  . TIA (transient ischemic attack) 06/17/2017  . Chronic back pain 12/29/2016  . Depression 12/15/2016  . Vitamin D deficiency 12/09/2016  . Right hip pain 12/07/2016  . Hypokalemia 12/07/2016  . Type 2 diabetes mellitus with vascular disease ([place]) 05/31/2016  . Normocytic normochromic anemia 05/31/2016  . Chest pain 05/31/2016  . Coronary artery disease involving native coronary artery of native heart without angina pectoris 01/06/2016  . DVT (deep venous thrombosis) ([place]) 01/06/2016  . Lactic acidosis 05/28/2014  . Nonspecific chest pain 01/29/2014  . Uncontrolled secondary diabetes with peripheral neuropathy ([place]) 01/29/2014  . Obesity, Class III, BMI 40-49.9 (morbid obesity) ([place]) 01/29/2014  . Snoring 01/29/2014  . Dyslipidemia 01/29/2014  . HTN (hypertension) 01/29/2014  . Abnormal nuclear stress test 01/29/2014    Zachery Conch MOT, OTR/L  08/12/2020, 11:42 AM  [facility] [address] [place], [place], [address] Phone: [phone]   Fax:  [phone]  Name: [name] MRN: [id number] Date of Birth: December 23, 1960

## 2020-08-17 ENCOUNTER — Other Ambulatory Visit: Payer: Self-pay

## 2020-08-17 ENCOUNTER — Encounter: Payer: Self-pay | Admitting: Occupational Therapy

## 2020-08-17 ENCOUNTER — Ambulatory Visit: Payer: Medicaid Other | Admitting: Occupational Therapy

## 2020-08-17 DIAGNOSIS — M79642 Pain in left hand: Secondary | ICD-10-CM

## 2020-08-17 DIAGNOSIS — M6281 Muscle weakness (generalized): Secondary | ICD-10-CM

## 2020-08-17 DIAGNOSIS — R278 Other lack of coordination: Secondary | ICD-10-CM

## 2020-08-17 DIAGNOSIS — M25542 Pain in joints of left hand: Secondary | ICD-10-CM

## 2020-08-17 NOTE — Therapy (Signed)
[facility] [address] [place], [place], [address] Phone: [phone]   Fax:  [phone]  Occupational Therapy Treatment  Patient Details  Name: [name] MRN: [id number] Date of Birth: 04/27/1961 Referring Provider (OT): [name]   Encounter Date: 08/17/2020   OT End of Session - 08/17/20 1152    Visit Number 10    Number of Visits 13    Date for OT Re-Evaluation 08/17/20    Authorization Type [employer] - 27 ot/pt/st    Authorization Time Period Week 6 of 6 (08/17/20) [employer] has auth 12 visits through 10/06/20    Authorization - Visit Number 10    Authorization - Number of Visits 12    Progress Note Due on Visit 12    OT Start Time 1148    OT Stop Time 1227    OT Time Calculation (min) 39 min    Activity Tolerance Patient tolerated treatment well    Behavior During Therapy [facility] for tasks assessed/performed           Past Medical History:  Diagnosis Date  . Anemia   . Arthritis   . Back pain   . CAD (coronary artery disease)    a. s/p DES to LAD in 05/2016  . Cervical radiculopathy   . Chronic diastolic CHF (congestive heart failure) ([place])   . Chronic pain   . Depression   . DVT (deep venous thrombosis) ([place])   . Hematemesis   . Hepatic steatosis   . Hyperlipidemia   . Hypertension   . IBS (irritable bowel syndrome)   . Morbid obesity ([place])   . OSA (obstructive sleep apnea)   . Pancreatitis   . PE (pulmonary thromboembolism) ([place])   . Peripheral neuropathy   . PUD (peptic ulcer disease)   . Renal disorder   . Stroke [facility])    a. ?details unclear - not seen on imaging when he was admitted in 05/2017 for TIA symptoms which were felt due to cervical radiculopathy.  . Thoracic aortic ectasia ([facility])    a. 4.3cm ectatic ascending thoracic aorta by CT 06/2017.   . Type 2 diabetes mellitus ([place])     Past Surgical History:  Procedure Laterality Date  . CARDIAC CATHETERIZATION N/A  05/31/2016   Procedure: Left Heart Cath and Coronary Angiography;  Surgeon: Peter M Martinique, MD;  Location: [place] [facility] LAB;  Service: Cardiovascular;  Laterality: N/A;  . CARDIAC CATHETERIZATION N/A 05/31/2016   Procedure: Intravascular Pressure Wire/FFR Study;  Surgeon: Peter M Martinique, MD;  Location: [place] [facility];  Service: Cardiovascular;  Laterality: N/A;  . CARDIAC CATHETERIZATION N/A 05/31/2016   Procedure: Coronary Stent Intervention;  Surgeon: Peter M Martinique, MD;  Location: [place] [facility];  Service: Cardiovascular;  Laterality: N/A;  . COLONOSCOPY WITH PROPOFOL N/A 04/29/2020   Procedure: COLONOSCOPY WITH PROPOFOL;  Surgeon: Wilford Corner, MD;  Location: [place];  Service: Endoscopy;  Laterality: N/A;  . ESOPHAGOGASTRODUODENOSCOPY N/A 04/29/2020   Procedure: ESOPHAGOGASTRODUODENOSCOPY (EGD);  Surgeon: Wilford Corner, MD;  Location: [place];  Service: Endoscopy;  Laterality: N/A;  . LEFT HEART CATH AND CORONARY ANGIOGRAPHY N/A 12/08/2017   Procedure: LEFT HEART CATH AND CORONARY ANGIOGRAPHY;  Surgeon: Leonie Man, MD;  Location: [place] [facility];  Service: Cardiovascular;  Laterality: N/A;  . LEFT HEART CATHETERIZATION WITH CORONARY ANGIOGRAM N/A 02/03/2014   Procedure: LEFT HEART CATHETERIZATION WITH CORONARY ANGIOGRAM;  Surgeon: Pixie Casino, MD;  Location: [facility] CATH  LAB;  Service: Cardiovascular;  Laterality: N/A;  . left leg stent     . POLYPECTOMY  04/29/2020   Procedure: POLYPECTOMY;  Surgeon: Wilford Corner, MD;  Location: [facility] ENDOSCOPY;  Service: Endoscopy;;    There were no vitals filed for this visit.   Subjective Assessment - 08/17/20 1152    Subjective  Pt reports low pain in bilateral hands today. 4/10    Pertinent History PMH: coronary artery disease, diabetes mellitus type 2 (insulin dependent), diastolic congestive heart failure, depression, hypertension, obesity, neuropathy, recent GI bleed with transfusion, atypical chest pain, 5 CVAs     Limitations fall risk    Patient Stated Goals Patient stated goals to "get feeling back in my hand and not hurting all the time"    Currently in Pain? Yes    Pain Score 4     Pain Location Hand    Pain Orientation Left;Right    Pain Descriptors / Indicators Aching    Pain Type Chronic pain    Pain Onset More than a month ago    Pain Frequency Constant                        OT Treatments/Exercises (OP) - 08/17/20 1202      Wrist Exercises   Other wrist exercises AROM BUE flex/ext, sup/pro, UD/RD, composite fist/extension      Hand Exercises   MCPJ Flexion Limitations tendon glides      Fine Motor Coordination (Hand/Wrist)   Fine Motor Coordination Small Pegboard    In Hand Manipulation Training golf balls, rotating ball, flipping cards    Small Pegboard increased time required and min drops - pt required cues for just utilizing LUE as he would use his RUE for rotating pegs etc    Manipulation of small objects 3 button test in 25 seconds. LTG met                    OT Short Term Goals - 08/17/20 1218      OT SHORT TERM GOAL #1   Title Pt will be independent with HEP 08/03/2020    Time 4    Period Weeks    Status Achieved    Target Date 08/03/20      OT SHORT TERM GOAL #2   Title Pt will improve grip strength by 5 lbs in LUE in order to increase independence and ability to complete ADLs and IADLs    Baseline LUE 73.8 lbs    Time 4    Period Weeks    Status Achieved   80.6 grip strength in LUE 0/10 pain. 10/11     OT SHORT TERM GOAL #3   Title Pt will improve functional use of LUE as evidenced by performing 3 button test in 45 seconds or less.    Time 4    Period Weeks    Status Achieved   10/11 - 35.78 sec     OT SHORT TERM GOAL #4   Title Patient will report pain no more than 5/10 with functional use of LUE    Time 4    Period Weeks    Status Achieved   heat has helped LUE and pt reports no pain with functional tasks and in therapy  sessions. 07/23/20     OT SHORT TERM GOAL #5   Title Pt will improve lateral pinch strength by 3 lbs in order to increase ability to manage fasteners with clothing management.  Baseline LUE 17 lbs    Time 4    Period Weeks    Status Deferred   19lbs 10/6. limiting grip with LUE thumb            OT Long Term Goals - 08/17/20 1205      OT LONG TERM GOAL #1   Title Pt will be indepedent with updated HEP 08/31/20    Time 6    Period Weeks    Status On-going      OT LONG TERM GOAL #2   Title Pt will improve grip strength by 10 lbs in LUE in order to increase independence and ability to complete ADLs and IADLs    Time 6    Period Weeks    Status Deferred   limiting grip with LUE secondary to trigger thumb     OT LONG TERM GOAL #3   Title Pt will improve functional use of LUE as evidenced by performing 3 button test in 35 seconds or less.    Time 6    Period Weeks    Status Achieved   29 seconds     OT LONG TERM GOAL #4   Title Patient will report pain no more than 3/10 with functional use of LUE    Time 6    Period Weeks    Status Not Met   pt remains at a 4/10 at rest in LUE.     OT LONG TERM GOAL #5   Title Patient will report understanding and compliance of orthosis/splint PRN and/or verbalize understanding of proper body mechanics for wrist neutralization.    Time 6    Period Weeks    Status Achieved      OT LONG TERM GOAL #6   Title Pt will improve lateral pinch strength by 6 lbs in order to increase ability to manage fasteners with clothing management.    Baseline LUE 17 lbs    Time 6    Period Weeks    Status Deferred   pt to limit gripping with LUE thumb.                Plan - 08/17/20 1216    Clinical Impression Statement Pt with improved pain today. Pt continues to benefit from OT to target pain, range of motion and comfort with braces and splints    OT Occupational Profile and History Detailed Assessment- Review of Records and additional review of  physical, cognitive, psychosocial history related to current functional performance    Occupational performance deficits (Please refer to evaluation for details): IADL's;ADL's;Leisure;Rest and Sleep;Social Participation    Body Structure / Function / Physical Skills ADL;Coordination;FMC;Strength;ROM;Body mechanics;IADL;Pain;Sensation;UE functional use;GMC    Rehab Potential Good    Clinical Decision Making Limited treatment options, no task modification necessary    Comorbidities Affecting Occupational Performance: May have comorbidities impacting occupational performance    Modification or Assistance to Complete Evaluation  No modification of tasks or assist necessary to complete eval    OT Frequency 2x / week    OT Duration 6 weeks   or 12 visits + eval over extended time   OT Treatment/Interventions Therapeutic exercise;Self-care/ADL training;Therapeutic activities;Splinting;Ultrasound;Electrical Stimulation;Patient/family education;Moist Heat;Fluidtherapy;DME and/or AE instruction;Manual Therapy;Passive range of motion    Plan discharge next session.    Consulted and Agree with Plan of Care Patient           Patient will benefit from skilled therapeutic intervention in order to improve the following deficits and impairments:  Body Structure / Function / Physical Skills: ADL, Coordination, FMC, Strength, ROM, Body mechanics, IADL, Pain, Sensation, UE functional use, GMC       Visit Diagnosis: Pain in left hand  Other lack of coordination  Muscle weakness (generalized)  Pain in joint of left hand    Problem List Patient Active Problem List   Diagnosis Date Noted  . Upper GI bleed 04/28/2020  . Hypertensive urgency 04/27/2020  . Acute GI bleeding 04/27/2020  . Mixed diabetic hyperlipidemia associated with type 2 diabetes mellitus ([place]) 04/27/2020  . Chronic diastolic CHF (congestive heart failure) ([place]) 08/23/2019  . ARF (acute renal failure) ([place]) 08/01/2019  .  Hyperkalemia 08/01/2019  . Uncontrolled type 2 diabetes mellitus with hyperglycemia ([place]) 08/01/2019  . AKI (acute kidney injury) ([place])   . Hypotension 07/31/2019  . DKA (diabetic ketoacidoses) 05/05/2019  . Blurred vision 05/05/2019  . Alteration consciousness 11/19/2018  . Neck pain 10/09/2018  . Left arm weakness 10/09/2018  . B12 deficiency 08/10/2017  . Persistent headaches 08/09/2017  . GERD (gastroesophageal reflux disease) 06/29/2017  . Left arm numbness   . Cerebral embolism with cerebral infarction 06/17/2017  . TIA (transient ischemic attack) 06/17/2017  . Chronic back pain 12/29/2016  . Depression 12/15/2016  . Vitamin D deficiency 12/09/2016  . Right hip pain 12/07/2016  . Hypokalemia 12/07/2016  . Type 2 diabetes mellitus with vascular disease ([place]) 05/31/2016  . Normocytic normochromic anemia 05/31/2016  . Chest pain 05/31/2016  . Coronary artery disease involving native coronary artery of native heart without angina pectoris 01/06/2016  . DVT (deep venous thrombosis) ([place]) 01/06/2016  . Lactic acidosis 05/28/2014  . Nonspecific chest pain 01/29/2014  . Uncontrolled secondary diabetes with peripheral neuropathy ([place]) 01/29/2014  . Obesity, Class III, BMI 40-49.9 (morbid obesity) ([place]) 01/29/2014  . Snoring 01/29/2014  . Dyslipidemia 01/29/2014  . HTN (hypertension) 01/29/2014  . Abnormal nuclear stress test 01/29/2014    Zachery Conch MOT, OTR/L  08/17/2020, 12:29 PM  [facility] [address] [place], [place], [address] Phone: [phone]   Fax:  [phone]  Name: [name] MRN: [id number] Date of Birth: 1961-10-23

## 2020-08-18 ENCOUNTER — Inpatient Hospital Stay (HOSPITAL_COMMUNITY)
Admission: EM | Admit: 2020-08-18 | Discharge: 2020-08-21 | DRG: 638 | Disposition: A | Discharge status: Home / Self Care | Payer: Medicaid Other | Attending: Internal Medicine | Admitting: Internal Medicine

## 2020-08-18 ENCOUNTER — Encounter (HOSPITAL_COMMUNITY): Payer: Self-pay | Admitting: Internal Medicine

## 2020-08-18 DIAGNOSIS — E1142 Type 2 diabetes mellitus with diabetic polyneuropathy: Secondary | ICD-10-CM | POA: Diagnosis present

## 2020-08-18 DIAGNOSIS — G8929 Other chronic pain: Secondary | ICD-10-CM | POA: Diagnosis present

## 2020-08-18 DIAGNOSIS — I160 Hypertensive urgency: Secondary | ICD-10-CM | POA: Diagnosis present

## 2020-08-18 DIAGNOSIS — E1159 Type 2 diabetes mellitus with other circulatory complications: Secondary | ICD-10-CM | POA: Diagnosis present

## 2020-08-18 DIAGNOSIS — E860 Dehydration: Secondary | ICD-10-CM | POA: Diagnosis present

## 2020-08-18 DIAGNOSIS — Z91018 Allergy to other foods: Secondary | ICD-10-CM

## 2020-08-18 DIAGNOSIS — Z6841 Body Mass Index (BMI) 40.0 and over, adult: Secondary | ICD-10-CM

## 2020-08-18 DIAGNOSIS — Z833 Family history of diabetes mellitus: Secondary | ICD-10-CM | POA: Diagnosis not present

## 2020-08-18 DIAGNOSIS — I2510 Atherosclerotic heart disease of native coronary artery without angina pectoris: Secondary | ICD-10-CM | POA: Diagnosis present

## 2020-08-18 DIAGNOSIS — Z955 Presence of coronary angioplasty implant and graft: Secondary | ICD-10-CM | POA: Diagnosis not present

## 2020-08-18 DIAGNOSIS — T380X5A Adverse effect of glucocorticoids and synthetic analogues, initial encounter: Secondary | ICD-10-CM | POA: Diagnosis present

## 2020-08-18 DIAGNOSIS — M549 Dorsalgia, unspecified: Secondary | ICD-10-CM | POA: Diagnosis present

## 2020-08-18 DIAGNOSIS — E785 Hyperlipidemia, unspecified: Secondary | ICD-10-CM | POA: Diagnosis present

## 2020-08-18 DIAGNOSIS — Z7982 Long term (current) use of aspirin: Secondary | ICD-10-CM

## 2020-08-18 DIAGNOSIS — M5412 Radiculopathy, cervical region: Secondary | ICD-10-CM | POA: Diagnosis present

## 2020-08-18 DIAGNOSIS — Z8249 Family history of ischemic heart disease and other diseases of the circulatory system: Secondary | ICD-10-CM

## 2020-08-18 DIAGNOSIS — T502X5A Adverse effect of carbonic-anhydrase inhibitors, benzothiadiazides and other diuretics, initial encounter: Secondary | ICD-10-CM | POA: Diagnosis not present

## 2020-08-18 DIAGNOSIS — I5032 Chronic diastolic (congestive) heart failure: Secondary | ICD-10-CM | POA: Diagnosis present

## 2020-08-18 DIAGNOSIS — N179 Acute kidney failure, unspecified: Secondary | ICD-10-CM | POA: Diagnosis present

## 2020-08-18 DIAGNOSIS — I10 Essential (primary) hypertension: Secondary | ICD-10-CM | POA: Diagnosis not present

## 2020-08-18 DIAGNOSIS — G4733 Obstructive sleep apnea (adult) (pediatric): Secondary | ICD-10-CM | POA: Diagnosis present

## 2020-08-18 DIAGNOSIS — E1110 Type 2 diabetes mellitus with ketoacidosis without coma: Secondary | ICD-10-CM | POA: Diagnosis not present

## 2020-08-18 DIAGNOSIS — K219 Gastro-esophageal reflux disease without esophagitis: Secondary | ICD-10-CM | POA: Diagnosis present

## 2020-08-18 DIAGNOSIS — E1165 Type 2 diabetes mellitus with hyperglycemia: Secondary | ICD-10-CM

## 2020-08-18 DIAGNOSIS — Z888 Allergy status to other drugs, medicaments and biological substances status: Secondary | ICD-10-CM

## 2020-08-18 DIAGNOSIS — Z7951 Long term (current) use of inhaled steroids: Secondary | ICD-10-CM

## 2020-08-18 DIAGNOSIS — Z20822 Contact with and (suspected) exposure to covid-19: Secondary | ICD-10-CM | POA: Diagnosis present

## 2020-08-18 DIAGNOSIS — Z803 Family history of malignant neoplasm of breast: Secondary | ICD-10-CM

## 2020-08-18 DIAGNOSIS — E876 Hypokalemia: Secondary | ICD-10-CM | POA: Diagnosis not present

## 2020-08-18 DIAGNOSIS — I110 Hypertensive heart disease with heart failure: Secondary | ICD-10-CM | POA: Diagnosis present

## 2020-08-18 DIAGNOSIS — K760 Fatty (change of) liver, not elsewhere classified: Secondary | ICD-10-CM | POA: Diagnosis present

## 2020-08-18 DIAGNOSIS — Z794 Long term (current) use of insulin: Secondary | ICD-10-CM

## 2020-08-18 DIAGNOSIS — Z886 Allergy status to analgesic agent status: Secondary | ICD-10-CM

## 2020-08-18 LAB — GLUCOSE, CAPILLARY
Glucose-Capillary: 361 mg/dL — ABNORMAL HIGH (ref 70–99)
Glucose-Capillary: 355 mg/dL — ABNORMAL HIGH (ref 70–99)
Glucose-Capillary: 292 mg/dL — ABNORMAL HIGH (ref 70–99)
Glucose-Capillary: 299 mg/dL — ABNORMAL HIGH (ref 70–99)
Glucose-Capillary: 288 mg/dL — ABNORMAL HIGH (ref 70–99)
Glucose-Capillary: 249 mg/dL — ABNORMAL HIGH (ref 70–99)
Glucose-Capillary: 224 mg/dL — ABNORMAL HIGH (ref 70–99)
Glucose-Capillary: 240 mg/dL — ABNORMAL HIGH (ref 70–99)
Glucose-Capillary: 266 mg/dL — ABNORMAL HIGH (ref 70–99)
Glucose-Capillary: 245 mg/dL — ABNORMAL HIGH (ref 70–99)
Glucose-Capillary: 221 mg/dL — ABNORMAL HIGH (ref 70–99)
Glucose-Capillary: 218 mg/dL — ABNORMAL HIGH (ref 70–99)
Glucose-Capillary: 234 mg/dL — ABNORMAL HIGH (ref 70–99)
Glucose-Capillary: 211 mg/dL — ABNORMAL HIGH (ref 70–99)
Glucose-Capillary: 212 mg/dL — ABNORMAL HIGH (ref 70–99)
Glucose-Capillary: 268 mg/dL — ABNORMAL HIGH (ref 70–99)
Glucose-Capillary: 294 mg/dL — ABNORMAL HIGH (ref 70–99)

## 2020-08-18 LAB — URINALYSIS, ROUTINE W REFLEX MICROSCOPIC
Bacteria, UA: NONE SEEN
Bilirubin Urine: NEGATIVE
Glucose, UA: >=500 mg/dL — AB
Hgb urine dipstick: NEGATIVE
Ketones, ur: 20 mg/dL — AB
Leukocytes,Ua: NEGATIVE
Nitrite: NEGATIVE
Protein, ur: NEGATIVE mg/dL
Specific Gravity, Urine: 1.02 (ref 1.005–1.030)
pH: 5 (ref 5.0–8.0)

## 2020-08-18 LAB — COMPREHENSIVE METABOLIC PANEL
ALT: 19 U/L (ref 0–44)
AST: 20 U/L (ref 15–41)
Albumin: 3.6 g/dL (ref 3.5–5.0)
Alkaline Phosphatase: 50 U/L (ref 38–126)
Anion gap: 14 (ref 5–15)
BUN: 20 mg/dL (ref 6–20)
CO2: 22 mmol/L (ref 22–32)
Calcium: 9.4 mg/dL (ref 8.9–10.3)
Chloride: 103 mmol/L (ref 98–111)
Creatinine, Ser: 0.88 mg/dL (ref 0.61–1.24)
GFR, Estimated: >60 mL/min (ref >60)
Glucose, Bld: 280 mg/dL — ABNORMAL HIGH (ref 70–99)
Potassium: 3.7 mmol/L (ref 3.5–5.1)
Sodium: 139 mmol/L (ref 135–145)
Total Bilirubin: 0.8 mg/dL (ref 0.3–1.2)
Total Protein: 6.7 g/dL (ref 6.5–8.1)

## 2020-08-18 LAB — BASIC METABOLIC PANEL
Anion gap: 22 — ABNORMAL HIGH (ref 5–15)
Anion gap: 18 — ABNORMAL HIGH (ref 5–15)
Anion gap: 14 (ref 5–15)
Anion gap: 12 (ref 5–15)
Anion gap: 11 (ref 5–15)
BUN: 27 mg/dL — ABNORMAL HIGH (ref 6–20)
BUN: 25 mg/dL — ABNORMAL HIGH (ref 6–20)
BUN: 22 mg/dL — ABNORMAL HIGH (ref 6–20)
BUN: 17 mg/dL (ref 6–20)
BUN: 19 mg/dL (ref 6–20)
CO2: 17 mmol/L — ABNORMAL LOW (ref 22–32)
CO2: 18 mmol/L — ABNORMAL LOW (ref 22–32)
CO2: 22 mmol/L (ref 22–32)
CO2: 25 mmol/L (ref 22–32)
CO2: 23 mmol/L (ref 22–32)
Calcium: 9.9 mg/dL (ref 8.9–10.3)
Calcium: 9.3 mg/dL (ref 8.9–10.3)
Calcium: 9.3 mg/dL (ref 8.9–10.3)
Calcium: 9 mg/dL (ref 8.9–10.3)
Calcium: 8.8 mg/dL — ABNORMAL LOW (ref 8.9–10.3)
Chloride: 96 mmol/L — ABNORMAL LOW (ref 98–111)
Chloride: 100 mmol/L (ref 98–111)
Chloride: 102 mmol/L (ref 98–111)
Chloride: 102 mmol/L (ref 98–111)
Chloride: 102 mmol/L (ref 98–111)
Creatinine, Ser: 1.49 mg/dL — ABNORMAL HIGH (ref 0.61–1.24)
Creatinine, Ser: 1.16 mg/dL (ref 0.61–1.24)
Creatinine, Ser: 0.9 mg/dL (ref 0.61–1.24)
Creatinine, Ser: 0.73 mg/dL (ref 0.61–1.24)
Creatinine, Ser: 1.12 mg/dL (ref 0.61–1.24)
GFR, Estimated: 55 mL/min — ABNORMAL LOW (ref >60)
GFR, Estimated: >60 mL/min (ref >60)
GFR, Estimated: >60 mL/min (ref >60)
GFR, Estimated: >60 mL/min (ref >60)
GFR, Estimated: >60 mL/min (ref >60)
Glucose, Bld: 612 mg/dL (ref 70–99)
Glucose, Bld: 461 mg/dL — ABNORMAL HIGH (ref 70–99)
Glucose, Bld: 297 mg/dL — ABNORMAL HIGH (ref 70–99)
Glucose, Bld: 246 mg/dL — ABNORMAL HIGH (ref 70–99)
Glucose, Bld: 293 mg/dL — ABNORMAL HIGH (ref 70–99)
Potassium: 5 mmol/L (ref 3.5–5.1)
Potassium: 5 mmol/L (ref 3.5–5.1)
Potassium: 3.8 mmol/L (ref 3.5–5.1)
Potassium: 3.6 mmol/L (ref 3.5–5.1)
Potassium: 3.7 mmol/L (ref 3.5–5.1)
Sodium: 135 mmol/L (ref 135–145)
Sodium: 136 mmol/L (ref 135–145)
Sodium: 138 mmol/L (ref 135–145)
Sodium: 139 mmol/L (ref 135–145)
Sodium: 136 mmol/L (ref 135–145)

## 2020-08-18 LAB — BASIC METABOLIC PANEL WITH GFR
Anion gap: 13 (ref 5–15)
BUN: 20 mg/dL (ref 6–20)
CO2: 23 mmol/L (ref 22–32)
Calcium: 9.1 mg/dL (ref 8.9–10.3)
Chloride: 101 mmol/L (ref 98–111)
Creatinine, Ser: 0.81 mg/dL (ref 0.61–1.24)
GFR, Estimated: >60 mL/min
Glucose, Bld: 260 mg/dL — ABNORMAL HIGH (ref 70–99)
Potassium: 3.8 mmol/L (ref 3.5–5.1)
Sodium: 137 mmol/L (ref 135–145)

## 2020-08-18 LAB — BLOOD GAS, VENOUS
Acid-base deficit: 18.5 mmol/L — ABNORMAL HIGH (ref 0.0–2.0)
Bicarbonate: 7.2 mmol/L — ABNORMAL LOW (ref 20.0–28.0)
O2 Saturation: 86 %
Patient temperature: 98.6
pCO2, Ven: 15.4 mmHg — CL (ref 44.0–60.0)
pH, Ven: 7.291 (ref 7.250–7.430)
pO2, Ven: 56.8 mmHg — ABNORMAL HIGH (ref 32.0–45.0)

## 2020-08-18 LAB — LACTIC ACID, PLASMA
Lactic Acid, Venous: 5.2 mmol/L (ref 0.5–1.9)
Lactic Acid, Venous: 4.8 mmol/L (ref 0.5–1.9)
Lactic Acid, Venous: 4.2 mmol/L (ref 0.5–1.9)
Lactic Acid, Venous: 3 mmol/L (ref 0.5–1.9)

## 2020-08-18 LAB — CBC WITH DIFFERENTIAL/PLATELET
Abs Immature Granulocytes: 0.04 10*3/uL (ref 0.00–0.07)
Basophils Absolute: 0 10*3/uL (ref 0.0–0.1)
Basophils Relative: 0 %
Eosinophils Absolute: 0 10*3/uL (ref 0.0–0.5)
Eosinophils Relative: 0 %
HCT: 41 % (ref 39.0–52.0)
Hemoglobin: 14 g/dL (ref 13.0–17.0)
Immature Granulocytes: 0 %
Lymphocytes Relative: 7 %
Lymphs Abs: 0.9 10*3/uL (ref 0.7–4.0)
MCH: 31.5 pg (ref 26.0–34.0)
MCHC: 34.1 g/dL (ref 30.0–36.0)
MCV: 92.1 fL (ref 80.0–100.0)
Monocytes Absolute: 0.4 10*3/uL (ref 0.1–1.0)
Monocytes Relative: 3 %
Neutro Abs: 11.9 10*3/uL — ABNORMAL HIGH (ref 1.7–7.7)
Neutrophils Relative %: 90 %
Platelets: 224 10*3/uL (ref 150–400)
RBC: 4.45 MIL/uL (ref 4.22–5.81)
RDW: 13.2 % (ref 11.5–15.5)
WBC: 13.2 10*3/uL — ABNORMAL HIGH (ref 4.0–10.5)
nRBC: 0 % (ref 0.0–0.2)

## 2020-08-18 LAB — HIV ANTIBODY (ROUTINE TESTING W REFLEX): HIV Screen 4th Generation wRfx: NONREACTIVE

## 2020-08-18 LAB — BETA-HYDROXYBUTYRIC ACID
Beta-Hydroxybutyric Acid: 1.06 mmol/L — ABNORMAL HIGH (ref 0.05–0.27)
Beta-Hydroxybutyric Acid: 0.63 mmol/L — ABNORMAL HIGH (ref 0.05–0.27)
Beta-Hydroxybutyric Acid: 0.18 mmol/L (ref 0.05–0.27)

## 2020-08-18 LAB — CBG MONITORING, ED
Glucose-Capillary: 555 mg/dL (ref 70–99)
Glucose-Capillary: 457 mg/dL — ABNORMAL HIGH (ref 70–99)

## 2020-08-18 LAB — RESPIRATORY PANEL BY RT PCR (FLU A&B, COVID)
Influenza A by PCR: NEGATIVE
Influenza B by PCR: NEGATIVE
SARS Coronavirus 2 by RT PCR: NEGATIVE

## 2020-08-18 LAB — MRSA PCR SCREENING: MRSA by PCR: NEGATIVE

## 2020-08-18 MED ORDER — FAMOTIDINE 20 MG PO TABS
20.0000 mg | ORAL_TABLET | Freq: Every day | ORAL | Status: DC
  Administered 2020-08-18 – 2020-08-20 (×3): 20 mg via ORAL
  Filled 2020-08-18 (×3): qty 1

## 2020-08-18 MED ORDER — ISOSORBIDE MONONITRATE ER 60 MG PO TB24
30.0000 mg | ORAL_TABLET | Freq: Every day | ORAL | Status: DC
  Administered 2020-08-18 – 2020-08-21 (×4): 30 mg via ORAL
  Filled 2020-08-18 (×4): qty 1

## 2020-08-18 MED ORDER — SODIUM CHLORIDE 0.9 % IV BOLUS
1000.0000 mL | Freq: Once | INTRAVENOUS | Status: AC
  Administered 2020-08-18: 1000 mL via INTRAVENOUS

## 2020-08-18 MED ORDER — SODIUM CHLORIDE 0.9 % IV BOLUS
500.0000 mL | Freq: Once | INTRAVENOUS | Status: AC
  Administered 2020-08-18: 500 mL via INTRAVENOUS

## 2020-08-18 MED ORDER — LACTATED RINGERS IV BOLUS
20.0000 mL/kg | Freq: Once | INTRAVENOUS | Status: AC
  Administered 2020-08-18: 3000 mL via INTRAVENOUS

## 2020-08-18 MED ORDER — INSULIN GLARGINE 100 UNIT/ML ~~LOC~~ SOLN
100.0000 [IU] | Freq: Once | SUBCUTANEOUS | Status: AC
  Administered 2020-08-18: 100 [IU] via SUBCUTANEOUS
  Filled 2020-08-18: qty 1

## 2020-08-18 MED ORDER — DULOXETINE HCL 30 MG PO CPEP
60.0000 mg | ORAL_CAPSULE | Freq: Every day | ORAL | Status: DC
  Administered 2020-08-18 – 2020-08-21 (×4): 60 mg via ORAL
  Filled 2020-08-18 (×4): qty 2

## 2020-08-18 MED ORDER — DEXTROSE IN LACTATED RINGERS 5 % IV SOLN: INTRAVENOUS | Status: DC

## 2020-08-18 MED ORDER — OXYCODONE HCL 5 MG PO TABS
15.0000 mg | ORAL_TABLET | Freq: Three times a day (TID) | ORAL | Status: DC | PRN
  Administered 2020-08-18 – 2020-08-21 (×7): 15 mg via ORAL
  Filled 2020-08-18 (×7): qty 3

## 2020-08-18 MED ORDER — DEXTROSE 50 % IV SOLN: 0.0000 mL | INTRAVENOUS | Status: DC | PRN

## 2020-08-18 MED ORDER — ATORVASTATIN CALCIUM 40 MG PO TABS
40.0000 mg | ORAL_TABLET | Freq: Every day | ORAL | Status: DC
  Administered 2020-08-18 – 2020-08-21 (×4): 40 mg via ORAL
  Filled 2020-08-18 (×4): qty 1

## 2020-08-18 MED ORDER — INSULIN ASPART 100 UNIT/ML ~~LOC~~ SOLN
0.0000 [IU] | Freq: Every day | SUBCUTANEOUS | Status: DC
  Administered 2020-08-18 – 2020-08-19 (×2): 3 [IU] via SUBCUTANEOUS
  Administered 2020-08-20: 2 [IU] via SUBCUTANEOUS

## 2020-08-18 MED ORDER — LACTATED RINGERS IV SOLN: INTRAVENOUS | Status: DC

## 2020-08-18 MED ORDER — ALLOPURINOL 100 MG PO TABS
100.0000 mg | ORAL_TABLET | Freq: Every day | ORAL | Status: DC
  Administered 2020-08-18 – 2020-08-21 (×4): 100 mg via ORAL
  Filled 2020-08-18 (×4): qty 1

## 2020-08-18 MED ORDER — CHLORHEXIDINE GLUCONATE CLOTH 2 % EX PADS
6.0000 | MEDICATED_PAD | Freq: Every day | CUTANEOUS | Status: DC
  Administered 2020-08-18 – 2020-08-19 (×3): 6 via TOPICAL

## 2020-08-18 MED ORDER — INSULIN GLARGINE 100 UNIT/ML ~~LOC~~ SOLN
100.0000 [IU] | Freq: Every day | SUBCUTANEOUS | Status: DC
  Administered 2020-08-19: 100 [IU] via SUBCUTANEOUS
  Filled 2020-08-18: qty 1

## 2020-08-18 MED ORDER — ASPIRIN 81 MG PO CHEW
81.0000 mg | CHEWABLE_TABLET | Freq: Every day | ORAL | Status: DC
  Administered 2020-08-18 – 2020-08-21 (×4): 81 mg via ORAL
  Filled 2020-08-18 (×4): qty 1

## 2020-08-18 MED ORDER — CARVEDILOL 12.5 MG PO TABS
25.0000 mg | ORAL_TABLET | Freq: Two times a day (BID) | ORAL | Status: DC
  Administered 2020-08-18 – 2020-08-21 (×7): 25 mg via ORAL
  Filled 2020-08-18 (×7): qty 2

## 2020-08-18 MED ORDER — ALBUTEROL SULFATE HFA 108 (90 BASE) MCG/ACT IN AERS: 2.0000 | INHALATION_SPRAY | Freq: Four times a day (QID) | RESPIRATORY_TRACT | Status: DC | PRN

## 2020-08-18 MED ORDER — PREGABALIN 100 MG PO CAPS
300.0000 mg | ORAL_CAPSULE | Freq: Two times a day (BID) | ORAL | Status: DC
  Administered 2020-08-18 – 2020-08-21 (×7): 300 mg via ORAL
  Filled 2020-08-18 (×7): qty 3

## 2020-08-18 MED ORDER — INSULIN REGULAR(HUMAN) IN NACL 100-0.9 UT/100ML-% IV SOLN
INTRAVENOUS | Status: DC
  Administered 2020-08-18: 18 [IU]/h via INTRAVENOUS
  Administered 2020-08-18: 13 [IU]/h via INTRAVENOUS
  Administered 2020-08-18: 19 [IU]/h via INTRAVENOUS
  Filled 2020-08-18 (×3): qty 100

## 2020-08-18 MED ORDER — ENOXAPARIN SODIUM 60 MG/0.6ML ~~LOC~~ SOLN
60.0000 mg | SUBCUTANEOUS | Status: DC
  Administered 2020-08-18: 60 mg via SUBCUTANEOUS
  Filled 2020-08-18: qty 0.6

## 2020-08-18 MED ORDER — ENOXAPARIN SODIUM 80 MG/0.8ML ~~LOC~~ SOLN
80.0000 mg | SUBCUTANEOUS | Status: DC
  Administered 2020-08-19 – 2020-08-20 (×2): 80 mg via SUBCUTANEOUS
  Filled 2020-08-18 (×3): qty 0.8

## 2020-08-18 MED ORDER — PANTOPRAZOLE SODIUM 40 MG PO TBEC
40.0000 mg | DELAYED_RELEASE_TABLET | Freq: Two times a day (BID) | ORAL | Status: DC
  Administered 2020-08-18 – 2020-08-21 (×7): 40 mg via ORAL
  Filled 2020-08-18 (×7): qty 1

## 2020-08-18 MED ORDER — FENOFIBRATE 160 MG PO TABS
160.0000 mg | ORAL_TABLET | Freq: Every day | ORAL | Status: DC
  Administered 2020-08-18 – 2020-08-21 (×4): 160 mg via ORAL
  Filled 2020-08-18 (×4): qty 1

## 2020-08-18 MED ORDER — TRAZODONE HCL 50 MG PO TABS
50.0000 mg | ORAL_TABLET | Freq: Every day | ORAL | Status: DC
  Administered 2020-08-18 – 2020-08-20 (×3): 50 mg via ORAL
  Filled 2020-08-18 (×3): qty 1

## 2020-08-18 MED ORDER — POTASSIUM CHLORIDE 10 MEQ/100ML IV SOLN
10.0000 meq | INTRAVENOUS | Status: AC
  Administered 2020-08-18 (×2): 10 meq via INTRAVENOUS
  Filled 2020-08-18 (×2): qty 100

## 2020-08-18 MED ORDER — HYDRALAZINE HCL 20 MG/ML IJ SOLN
10.0000 mg | Freq: Four times a day (QID) | INTRAMUSCULAR | Status: DC | PRN
  Administered 2020-08-20: 10 mg via INTRAVENOUS
  Filled 2020-08-18: qty 1

## 2020-08-18 MED ORDER — INSULIN ASPART 100 UNIT/ML ~~LOC~~ SOLN
0.0000 [IU] | Freq: Three times a day (TID) | SUBCUTANEOUS | Status: DC
  Administered 2020-08-18: 11 [IU] via SUBCUTANEOUS
  Administered 2020-08-19: 7 [IU] via SUBCUTANEOUS
  Administered 2020-08-19 (×2): 11 [IU] via SUBCUTANEOUS
  Administered 2020-08-20: 4 [IU] via SUBCUTANEOUS
  Administered 2020-08-20: 7 [IU] via SUBCUTANEOUS
  Administered 2020-08-20: 4 [IU] via SUBCUTANEOUS
  Administered 2020-08-21: 7 [IU] via SUBCUTANEOUS
  Administered 2020-08-21: 11 [IU] via SUBCUTANEOUS

## 2020-08-18 NOTE — ED Provider Notes (Signed)
[facility] DEPT Provider Note   CSN: [id number] Arrival date & time: 08/18/20  0022   Time seen 12:35 AM  History Chief Complaint  Patient presents with  . Hyperglycemia    Patient recieved a steroid shot today for chronic back pain, went home and realized that everytime he checked his glucose level it was elevating, the highest was 597, its now down to 546.    [name] is a 59 y.o. male.  HPI   Patient states he got 2 IM steroid shots today for his back pain.  They were not back injections.  He had it done about 3:30 PM.  He states he checked his blood glucose level after that and it was 461.  He had a headache when he got home.  He states he ate and rechecked his blood sugar about 2 hours later which was around 10 PM and it was 597.  He said he actually feels fine.  He states his headache is gone.  He states he did have an episode of vomiting right after he got his steroid injection but he denies nausea or vomiting now.  He denies chest pain and shortness of breath now but he states he has been having that chronically for a while.  He does describe some polyuria and being very thirsty.  He denies abdominal pain or nausea.  He states he has been diabetic since about 2016 and he was treated with oral agents and insulin.  His states his CBGs in the morning are typically 190-220.  His CBGs in the evening are usually around 260.  He states about 11:15 this evening he did take 85 units of Humulin insulin which was according to his sliding scale for a CBG over 400.  He states his CBG has never been this high.  PCP [name], FNP Endo Dr Ronnald Ramp  Past Medical History:  Diagnosis Date  . Anemia   . Arthritis   . Back pain   . CAD (coronary artery disease)    a. s/p DES to LAD in 05/2016  . Cervical radiculopathy   . Chronic diastolic CHF (congestive heart failure) ([place])   . Chronic pain   . Depression   . DVT (deep venous thrombosis) ([place])   .  Hematemesis   . Hepatic steatosis   . Hyperlipidemia   . Hypertension   . IBS (irritable bowel syndrome)   . Morbid obesity ([place])   . OSA (obstructive sleep apnea)   . Pancreatitis   . PE (pulmonary thromboembolism) ([place])   . Peripheral neuropathy   . PUD (peptic ulcer disease)   . Renal disorder   . Stroke [facility])    a. ?details unclear - not seen on imaging when he was admitted in 05/2017 for TIA symptoms which were felt due to cervical radiculopathy.  . Thoracic aortic ectasia (HCC)    a. 4.3cm ectatic ascending thoracic aorta by CT 06/2017.   . Type 2 diabetes mellitus [facility])     Patient Active Problem List   Diagnosis Date Noted  . Upper GI bleed 04/28/2020  . Hypertensive urgency 04/27/2020  . Acute GI bleeding 04/27/2020  . Mixed diabetic hyperlipidemia associated with type 2 diabetes mellitus ([place]) 04/27/2020  . Chronic diastolic CHF (congestive heart failure) ([place]) 08/23/2019  . ARF (acute renal failure) ([place]) 08/01/2019  . Hyperkalemia 08/01/2019  . Uncontrolled type 2 diabetes mellitus with hyperglycemia ([place]) 08/01/2019  . AKI (acute kidney injury) ([place])   .  Hypotension 07/31/2019  . DKA (diabetic ketoacidoses) 05/05/2019  . Blurred vision 05/05/2019  . Alteration consciousness 11/19/2018  . Neck pain 10/09/2018  . Left arm weakness 10/09/2018  . B12 deficiency 08/10/2017  . Persistent headaches 08/09/2017  . GERD (gastroesophageal reflux disease) 06/29/2017  . Left arm numbness   . Cerebral embolism with cerebral infarction 06/17/2017  . TIA (transient ischemic attack) 06/17/2017  . Chronic back pain 12/29/2016  . Depression 12/15/2016  . Vitamin D deficiency 12/09/2016  . Right hip pain 12/07/2016  . Hypokalemia 12/07/2016  . Type 2 diabetes mellitus with vascular disease ([place]) 05/31/2016  . Normocytic normochromic anemia 05/31/2016  . Chest pain 05/31/2016  . Coronary artery disease involving native coronary artery of native heart without angina pectoris  01/06/2016  . DVT (deep venous thrombosis) ([place]) 01/06/2016  . Lactic acidosis 05/28/2014  . Nonspecific chest pain 01/29/2014  . Uncontrolled secondary diabetes with peripheral neuropathy ([place]) 01/29/2014  . Obesity, Class III, BMI 40-49.9 (morbid obesity) ([place]) 01/29/2014  . Snoring 01/29/2014  . Dyslipidemia 01/29/2014  . HTN (hypertension) 01/29/2014  . Abnormal nuclear stress test 01/29/2014    Past Surgical History:  Procedure Laterality Date  . CARDIAC CATHETERIZATION N/A 05/31/2016   Procedure: Left Heart Cath and Coronary Angiography;  Surgeon: Peter M Martinique, MD;  Location: [facility] CV LAB;  Service: Cardiovascular;  Laterality: N/A;  . CARDIAC CATHETERIZATION N/A 05/31/2016   Procedure: Intravascular Pressure Wire/FFR Study;  Surgeon: Peter M Martinique, MD;  Location: [place] CV LAB;  Service: Cardiovascular;  Laterality: N/A;  . CARDIAC CATHETERIZATION N/A 05/31/2016   Procedure: Coronary Stent Intervention;  Surgeon: Peter M Martinique, MD;  Location: [place] CV LAB;  Service: Cardiovascular;  Laterality: N/A;  . COLONOSCOPY WITH PROPOFOL N/A 04/29/2020   Procedure: COLONOSCOPY WITH PROPOFOL;  Surgeon: Wilford Corner, MD;  Location: [facility];  Service: Endoscopy;  Laterality: N/A;  . ESOPHAGOGASTRODUODENOSCOPY N/A 04/29/2020   Procedure: ESOPHAGOGASTRODUODENOSCOPY (EGD);  Surgeon: Wilford Corner, MD;  Location: [facility];  Service: Endoscopy;  Laterality: N/A;  . LEFT HEART CATH AND CORONARY ANGIOGRAPHY N/A 12/08/2017   Procedure: LEFT HEART CATH AND CORONARY ANGIOGRAPHY;  Surgeon: Leonie Man, MD;  Location: [place] CV LAB;  Service: Cardiovascular;  Laterality: N/A;  . LEFT HEART CATHETERIZATION WITH CORONARY ANGIOGRAM N/A 02/03/2014   Procedure: LEFT HEART CATHETERIZATION WITH CORONARY ANGIOGRAM;  Surgeon: Pixie Casino, MD;  Location: [facility] CATH LAB;  Service: Cardiovascular;  Laterality: N/A;  . left leg stent     . POLYPECTOMY  04/29/2020   Procedure:  POLYPECTOMY;  Surgeon: Wilford Corner, MD;  Location: [facility] ENDOSCOPY;  Service: Endoscopy;;       Family History  Problem Relation Age of Onset  . Cancer Father   . Hypertension Mother   . Diabetes Mother   . Breast cancer Mother   . Hypertension Brother   . Diabetes Brother   . Hypertension Sister   . Diabetes Sister     Social History   Tobacco Use  . Smoking status: Never Smoker  . Smokeless tobacco: Never Used  Vaping Use  . Vaping Use: Never used  Substance Use Topics  . Alcohol use: No  . Drug use: No    Home Medications Prior to Admission medications   Medication Sig Start Date End Date Taking? Authorizing Provider  Albuterol Sulfate (PROAIR RESPICLICK) 009 (90 Base) MCG/ACT AEPB Inhale 2 puffs into the lungs every 6 (six) hours as needed. Patient taking differently: Inhale 2 puffs into  the lungs every 6 (six) hours as needed (for breathing).  11/06/18  Yes Robyn Haber, MD  allopurinol (ZYLOPRIM) 100 MG tablet Take 100 mg by mouth daily. 03/31/20  Yes [provider]  aspirin 81 MG chewable tablet Chew 1 tablet (81 mg total) by mouth daily. 06/01/16  Yes Elwin Mocha, MD  atorvastatin (LIPITOR) 40 MG tablet Take 1 tablet (40 mg total) by mouth daily. 02/12/20 07/17/21 Yes Hilty, Nadean Corwin, MD  carvedilol (COREG) 25 MG tablet Take 1 tablet (25 mg total) by mouth 2 (two) times daily with a meal. 01/26/17  Yes Strader, [place] M, PA-C  [facility] VITAMIN B12 1000 MCG tablet Take 1,000 mcg by mouth daily. 03/06/20  Yes [provider]  diclofenac (FLECTOR) 1.3 % PTCH Place 1 patch onto the skin daily as needed for pain. 03/02/20  Yes [provider]  DULoxetine (CYMBALTA) 60 MG capsule Take 1 capsule (60 mg total) by mouth daily. Please request future refills from PCP. 11/28/19  Yes Marcial Pacas, MD  famotidine (PEPCID) 20 MG tablet Take 20 mg by mouth daily. 03/05/20  Yes [provider]  fenofibrate (TRICOR) 145 MG tablet Take 1 tablet (145 mg  total) by mouth daily. 06/18/20  Yes Hilty, Nadean Corwin, MD  furosemide (LASIX) 40 MG tablet Take 1 tablet (40 mg total) by mouth 3 (three) times daily. 08/23/19 08/22/20 Yes Pahwani, Einar Grad, MD  glipiZIDE (GLUCOTROL) 10 MG tablet Take 10 mg by mouth 2 (two) times daily. 05/30/17  Yes [provider]  HUMULIN R U-500 KWIKPEN 500 UNIT/ML kwikpen Inject 0-85 Units into the skin in the morning, at noon, and at bedtime. Per sliding scale- 06/04/20  Yes [provider]  isosorbide mononitrate (IMDUR) 30 MG 24 hr tablet Take 1 tablet (30 mg total) by mouth daily. 10/16/19  Yes Lendon Colonel, NP  lisinopril (ZESTRIL) 5 MG tablet Take 5 mg by mouth daily. 03/05/20  Yes [provider]  nitroGLYCERIN (NITROSTAT) 0.4 MG SL tablet Place 1 tablet (0.4 mg total) under the tongue every 5 (five) minutes as needed for chest pain. 02/06/19  Yes Skeet Latch, MD  ondansetron (ZOFRAN) 4 MG tablet Take 4 mg by mouth 2 (two) times daily as needed for nausea/vomiting. 04/21/20  Yes [provider]  oxyCODONE (ROXICODONE) 15 MG immediate release tablet Take 15 mg 3 (three) times daily as needed by mouth for pain.  08/28/17  Yes [provider]  pantoprazole (PROTONIX) 40 MG tablet Take 1 tablet (40 mg total) by mouth 2 (two) times daily. 04/30/20  Yes Domenic Polite, MD  potassium chloride (MICRO-K) 10 MEQ CR capsule Take 10 mEq by mouth in the morning and at bedtime. 04/03/20  Yes [provider]  pregabalin (LYRICA) 300 MG capsule Take 300 mg by mouth 2 (two) times daily. 05/18/19  Yes [provider]  tiZANidine (ZANAFLEX) 4 MG tablet Take 4 mg by mouth 3 (three) times daily as needed for muscle spasms. 03/31/20  Yes [provider]  traZODone (DESYREL) 50 MG tablet Take 1 tablet (50 mg total) by mouth at bedtime. 12/15/16  Yes Burns, Arloa Koh, MD  Vitamin D, Ergocalciferol, (DRISDOL) 1.25 MG (50000 UT) CAPS capsule Take 50,000 Units by mouth once a week.  mondays 08/15/19  Yes [provider]  Accu-Chek FastClix Lancets MISC 1 Device by Other route 2 (two) times daily.  03/20/19   [provider]  Insulin Pen Needle 31G X 5 MM MISC Use 1 needle daily  to inject insulin as prescribed Patient taking differently: 1 each by Other route See admin instructions. Use 1 needle daily to inject insulin as prescribed 06/18/17   Barton Dubois, MD    Allergies    Coconut flavor [flavoring agent], Coconut oil, Ibuprofen, Aleve [naproxen], Nsaids, and Vascepa [icosapent ethyl]  Review of Systems   Review of Systems  All other systems reviewed and are negative.   Physical Exam Updated Vital Signs BP (!) 171/85 (BP Location: Right Arm)   Pulse 98   Temp 98.7 F (37.1 C) (Oral)   Resp 18   Ht 5\' 10"  (1.778 m)   Wt (!) 154.3 kg   SpO2 96%   BMI 48.81 kg/m   Physical Exam Vitals and nursing note reviewed.  Constitutional:      Appearance: Normal appearance. He is obese.  HENT:     Head: Normocephalic and atraumatic.     Right Ear: External ear normal.     Left Ear: External ear normal.     Mouth/Throat:     Mouth: Mucous membranes are dry.  Eyes:     Extraocular Movements: Extraocular movements intact.     Conjunctiva/sclera: Conjunctivae normal.     Pupils: Pupils are equal, round, and reactive to light.  Cardiovascular:     Rate and Rhythm: Normal rate and regular rhythm.     Pulses: Normal pulses.     Heart sounds: Normal heart sounds. No murmur heard.   Pulmonary:     Effort: Pulmonary effort is normal. No respiratory distress.     Breath sounds: Normal breath sounds.  Abdominal:     General: Bowel sounds are normal.     Palpations: Abdomen is soft.     Tenderness: There is no abdominal tenderness. There is no guarding or rebound.  Musculoskeletal:        General: Normal range of motion.     Cervical back: Normal range of motion.  Skin:    General: Skin is warm and dry.  Neurological:     General: No focal  deficit present.     Mental Status: He is alert and oriented to person, place, and time.     Cranial Nerves: No cranial nerve deficit.  Psychiatric:        Mood and Affect: Mood normal.        Behavior: Behavior normal.        Thought Content: Thought content normal.     ED Results / Procedures / Treatments   Labs (all labs ordered are listed, but only abnormal results are displayed) Results for orders placed or performed during the hospital encounter of 16/10/96  Basic metabolic panel  Result Value Ref Range   Sodium 135 135 - 145 mmol/L   Potassium 5.0 3.5 - 5.1 mmol/L   Chloride 96 (L) 98 - 111 mmol/L   CO2 17 (L) 22 - 32 mmol/L   Glucose, Bld 612 (HH) 70 - 99 mg/dL   BUN 27 (H) 6 - 20 mg/dL   Creatinine, Ser 1.49 (H) 0.61 - 1.24 mg/dL   Calcium 9.9 8.9 - 10.3 mg/dL   GFR, Estimated 55 (L) >60 mL/min   Anion gap 22 (H) 5 - 15  Lactic acid, plasma  Result Value Ref Range   Lactic Acid, Venous 5.2 (HH) 0.5 - 1.9 mmol/L  Urinalysis, Routine w reflex microscopic Urine, Clean [place]  Result Value Ref Range   Color, Urine COLORLESS (A) YELLOW   APPearance CLEAR CLEAR   Specific Gravity,  Urine 1.020 1.005 - 1.030   pH 5.0 5.0 - 8.0   Glucose, UA >=500 (A) NEGATIVE mg/dL   Hgb urine dipstick NEGATIVE NEGATIVE   Bilirubin Urine NEGATIVE NEGATIVE   Ketones, ur 20 (A) NEGATIVE mg/dL   Protein, ur NEGATIVE NEGATIVE mg/dL   Nitrite NEGATIVE NEGATIVE   Leukocytes,Ua NEGATIVE NEGATIVE   Bacteria, UA NONE SEEN NONE SEEN   Mucus PRESENT   CBG monitoring, ED  Result Value Ref Range   Glucose-Capillary 555 (HH) 70 - 99 mg/dL   Laboratory interpretation all normal except lactic acidosis, DKA with anion gap of 22, renal insufficiency, glucosuria with ketones   EKG None  Radiology No results found.  Procedures .Critical Care Performed by: Rolland Porter, MD Authorized by: Rolland Porter, MD   Critical care provider statement:    Critical care time (minutes):  38   Critical care  was necessary to treat or prevent imminent or life-threatening deterioration of the following conditions:  Endocrine crisis   Critical care was time spent personally by me on the following activities:  Examination of patient, obtaining history from patient or surrogate, ordering and review of laboratory studies, pulse oximetry, re-evaluation of patient's condition and review of old charts   (including critical care time)  Medications Ordered in ED Medications  insulin regular, human (MYXREDLIN) 100 units/ 100 mL infusion (has no administration in time range)  lactated ringers infusion (has no administration in time range)  dextrose 5 % in lactated ringers infusion (has no administration in time range)  dextrose 50 % solution 0-50 mL (has no administration in time range)  potassium chloride 10 mEq in 100 mL IVPB (has no administration in time range)  sodium chloride 0.9 % bolus 1,000 mL (0 mLs Intravenous Stopped 08/18/20 0158)  lactated ringers bolus 3,086 mL (3,000 mLs Intravenous New Bag/Given 08/18/20 0209)    ED Course  I have reviewed the triage vital signs and the nursing notes.  Pertinent labs & imaging results that were available during my care of the patient were reviewed by me and considered in my medical decision making (see chart for details).    MDM Rules/Calculators/A&P                          Patient was given some IV fluids while waiting for his bmet to result so he could be started on the [place].  On reviewing patient's bmet he is in DKA.  He was started on an insulin drip and given IV fluids.  His lactic acid was also very elevated.  Patient will need admission for correction of his DKA.  2:00 AM I talked to the patient about his test results that are back so far and that he was in DKA and he would need to be admitted.  Patient understands.  2:55 AM Dr. Alcario Drought, hospitalist will admit.  Final Clinical Impression(s) / ED Diagnoses Final diagnoses:  Diabetic  ketoacidosis without coma associated with type 2 diabetes mellitus ([place])    Rx / DC Orders  Plan admission  Rolland Porter, MD, Barbette Or, MD 08/18/20 [phone]

## 2020-08-18 NOTE — ED Triage Notes (Signed)
Patient from home with c/o hyperglycemia, had a steroid injection today and noticed that his glucose was going up to 597, now is 546.  160 palp, 96, 18, 96%ra.

## 2020-08-18 NOTE — ED Notes (Signed)
Patient is resting comfortably. 

## 2020-08-18 NOTE — Progress Notes (Signed)
MD notified of 1500 Bmet. Orders received to transition off insulin gtt. 100 units Lantus administered and IV insulin to be stopped at 1925.

## 2020-08-18 NOTE — Progress Notes (Signed)
Care started after midnight as patient arrived to the ED after midnight and is admitted early this morning by Dr. Jennette Kettle and I am in current agreement with his assessment and plan.  Additional changes to the plan of care been made accordingly.  Is a 59 year old morbidly obese African-American male with a past medical history significant for but not limited to diabetes mellitus type 2 which is poorly controlled, CAD status post stent placement, chronic diastolic CHF, chronic back pain, hypertension, as well as other comorbidities who received an IM steroid injection for his back pain around 3:30 PM.  He checked his blood sugar after and was 461.  Subsequently he developed a headache when he got home and rechecked his blood sugar 2 hours later and is 10/29/1993.  Headache resolved and patient did have some vomiting after steroid injection but no nausea vomiting now.  He presented to the emergency room for elevated blood sugar.  Was found to be in DKA and was placed on insulin drip.  Patient's lactate level was elevated so he is being given IV fluid hydration but this has been limited due to his diastolic CHF.  Currently he remains on insulin drip and blood sugars remain elevated but are improved.  Gap is closed but he still has a lactic acidosis.  Will likely transition the patient once blood sugars are fairly well controlled and sustaining under 250.  Patient's beta hydroxybutyrate acid was elevated indicating of DKA and his AKI has resolved.  Leukocytosis is likely reactive in the setting of his vomiting and DKA.  We will repeat blood work in the a.m. and subsequently transition to 100 units of Lantus once he is ready to eat.  We will continue to monitor the patient's clinical response to intervention and repeat blood work in the a.m. and assess his blood sugars.  Appreciate diabetes education coordinator to further evaluate and treat.

## 2020-08-18 NOTE — Progress Notes (Signed)
Inpatient Diabetes Program Recommendations  AACE/ADA: New Consensus Statement on Inpatient Glycemic Control (2015)  Target Ranges:  Prepandial:   less than 140 mg/dL      Peak postprandial:   less than 180 mg/dL (1-2 hours)      Critically ill patients:  140 - 180 mg/dL   Lab Results  Component Value Date   GLUCAP 288 (H) 08/18/2020   HGBA1C 12.4 (H) 04/28/2020    Review of Glycemic Control  Diabetes history: DM2 Outpatient Diabetes medications: U Regular 500 -See below Current orders for Inpatient glycemic control: IV insulin  Inpatient Diabetes Program Recommendations:   Patient sees Dr. Ronnald Ramp as endocrinologist. Last visit 06/04/20. Patient was changed from [place] insulin to  U-Regular 500 tid (CBG 70-90= 25 units) 91-130=45 units 131-150= 50 units 201-250=60 units 251-300=65 units 301-350 =70 units >450 = 85 units  When patient meets criteria to transition to subcutaneous insulin: Give basal 2 hrs. Prior to D/C of insulin drip and cover CBG with Novolog correction @ time of IV drip discontinued. -Lantus 100 units -Novolog resistant correction scale tid + hs 0-5 (if not eating, q 4 hrs.) -Add Novolog 5 units tid meal coverage if eats 50%  Thank you, Bethena Roys E. Jawon Dipiero, RN, MSN, CDE  Diabetes Coordinator Inpatient Glycemic Control Team Team Pager [phone] (8am-5pm) 08/18/2020 9:14 AM

## 2020-08-18 NOTE — ED Notes (Signed)
Date and time results received: 08/18/20 1:35 AM  (use smartphrase ".now" to insert current time)  Test: Lactic Acid Critical Value: 5.2  Name of Provider Notified: Dr. Eliane Decree  Orders Received? Or Actions Taken?

## 2020-08-18 NOTE — TOC Initial Note (Signed)
Transition of Care [facility]) - Initial/Assessment Note    Patient Details  Name: [name] MRN: [id number] Date of Birth: 1961/09/19  [facility] of [facility]) CM/SW Contact:    Leeroy Cha, RN Phone Number: 08/18/2020, 7:50 AM  Clinical Narrative:                 Dka, iv insulin and iv lr at 125cc/hr, awaake and alert, lives by herslef does have a sister in the area, Plan-home with self care, Following for progression Diabetic coordinator to see.  Expected Discharge Plan: Home/Self Care Barriers to Discharge: Barriers Unresolved (comment) (iv insulin)   Patient Goals and CMS Choice Patient states their goals for this hospitalization and ongoing recovery are:: to go home CMS Medicare.gov Compare Post Acute Care list provided to:: Patient    Expected Discharge Plan and Services Expected Discharge Plan: Home/Self Care   Discharge Planning Services: CM Consult   Living arrangements for the past 2 months: Single Family Home                                      Prior Living Arrangements/Services Living arrangements for the past 2 months: Single Family Home Lives with:: Self Patient language and need for interpreter reviewed:: Yes Do you feel safe going back to the place where you live?: Yes      Need for Family Participation in Patient Care: Yes (Comment) Care giver support system in place?: Yes (comment)   Criminal Activity/Legal Involvement Pertinent to Current Situation/Hospitalization: No - Comment as needed  Activities of Daily Living      Permission Sought/Granted                  Emotional Assessment Appearance:: Appears stated age Attitude/Demeanor/Rapport: Engaged Affect (typically observed): Calm Orientation: : Oriented to Place, Oriented to Self, Oriented to  Time, Oriented to Situation Alcohol / Substance Use: Not Applicable Psych Involvement: No (comment)  Admission diagnosis:  DKA (diabetic ketoacidosis) ([place])  [E11.10] Diabetic ketoacidosis without coma associated with type 2 diabetes mellitus ([place]) [E11.10] Patient Active Problem List   Diagnosis Date Noted  . Upper GI bleed 04/28/2020  . Hypertensive urgency 04/27/2020  . Acute GI bleeding 04/27/2020  . Mixed diabetic hyperlipidemia associated with type 2 diabetes mellitus ([place]) 04/27/2020  . Chronic diastolic CHF (congestive heart failure) ([place]) 08/23/2019  . ARF (acute renal failure) ([place]) 08/01/2019  . Hyperkalemia 08/01/2019  . Uncontrolled type 2 diabetes mellitus with hyperglycemia ([place]) 08/01/2019  . AKI (acute kidney injury) ([place])   . Hypotension 07/31/2019  . DKA (diabetic ketoacidosis) ([place]) 05/05/2019  . Blurred vision 05/05/2019  . Alteration consciousness 11/19/2018  . Neck pain 10/09/2018  . Left arm weakness 10/09/2018  . B12 deficiency 08/10/2017  . Persistent headaches 08/09/2017  . GERD (gastroesophageal reflux disease) 06/29/2017  . Left arm numbness   . Cerebral embolism with cerebral infarction 06/17/2017  . TIA (transient ischemic attack) 06/17/2017  . Chronic back pain 12/29/2016  . Depression 12/15/2016  . Vitamin D deficiency 12/09/2016  . Right hip pain 12/07/2016  . Hypokalemia 12/07/2016  . Type 2 diabetes mellitus with vascular disease ([place]) 05/31/2016  . Normocytic normochromic anemia 05/31/2016  . Chest pain 05/31/2016  . Coronary artery disease involving native coronary artery of native heart without angina pectoris 01/06/2016  . DVT (deep venous thrombosis) ([place]) 01/06/2016  . Lactic acidosis 05/28/2014  . Nonspecific chest pain  01/29/2014  . Uncontrolled secondary diabetes with peripheral neuropathy ([place]) 01/29/2014  . Obesity, Class III, BMI 40-49.9 (morbid obesity) ([place]) 01/29/2014  . Snoring 01/29/2014  . Dyslipidemia 01/29/2014  . HTN (hypertension) 01/29/2014  . Abnormal nuclear stress test 01/29/2014   PCP:  [name], FNP Pharmacy:   [id number] [place], [place] [address] [place] [address] Phone: [phone] Fax: [phone]  [place] - [place], [place] [address] Phone: [phone] Fax: [phone]  [facility] [place], [place] - [address] [place] [address] Phone: [phone] Fax: [phone]     Social Determinants of Health (SDOH) Interventions    Readmission Risk Interventions No flowsheet data found.

## 2020-08-18 NOTE — ED Notes (Signed)
Date and time results received: 08/18/20 1:32 AM  (use smartphrase ".now" to insert current time)  Test: Glucose Critical Value: 612  Name of Provider Notified: Dr.I Tomi Bamberger  Orders Received? Or Actions Taken?:

## 2020-08-18 NOTE — H&P (Addendum)
History and Physical    [name] ZOX:[id number] DOB: 01/14/1961 DOA: 08/18/2020  PCP: [name], FNP  Patient coming from: Home  I have personally briefly reviewed patient's old medical records in [place]  Chief Complaint: Hyperglycemia  HPI: [name] is a 59 y.o. male with medical history significant of DM2 poorly controlled, CAD s/p stent, dCHF, HTN, chronic back pain.  Pt had IM steroid injection today for back pain.  Looks like he is scheduled for epidural injection in Nov.  Had steroid injection at around 330pm.  Checked BGL after that was 461.  Had headache when he got home.  Ate, rechecked BGL 2h later around 10pm and it was 597.  Headache resolved and patient actually feels fine.  Did have episode of vomiting after steroid injection but no N/V now.  But presents to ED for elevated BGL.  No CP, SOB at this time (though has been having these chronically for a while he says).  Has polyuria and thirst.  Symptoms are mild.  Nothing makes symptoms better or worse.  Symptoms are persistent.   ED Course: Pt with dehydration and DKA.  AG 22.  PH 7.29, PCO2 15.4, bicarb 7.2 on the VBG.  Bicarb is 17 though on BMP.  Not sure why the discrepancy.  Lactate 5.2, BHB 1.1.  Getting multiple L bolus and insulin gtt now.   Review of Systems: As per HPI, otherwise all review of systems negative.  Past Medical History:  Diagnosis Date  . Anemia   . Arthritis   . Back pain   . CAD (coronary artery disease)    a. s/p DES to LAD in 05/2016  . Cervical radiculopathy   . Chronic diastolic CHF (congestive heart failure) ([place])   . Chronic pain   . Depression   . DVT (deep venous thrombosis) ([place])   . Hematemesis   . Hepatic steatosis   . Hyperlipidemia   . Hypertension   . IBS (irritable bowel syndrome)   . Morbid obesity ([place])   . OSA (obstructive sleep apnea)   . Pancreatitis   . PE (pulmonary thromboembolism) ([place])   . Peripheral neuropathy    . PUD (peptic ulcer disease)   . Renal disorder   . Stroke [facility])    a. ?details unclear - not seen on imaging when he was admitted in 05/2017 for TIA symptoms which were felt due to cervical radiculopathy.  . Thoracic aortic ectasia (H[facility])    a. 4.3cm ectatic ascending thoracic aorta by CT 06/2017.   . Type 2 diabetes mellitus ([place])     Past Surgical History:  Procedure Laterality Date  . CARDIAC CATHETERIZATION N/A 05/31/2016   Procedure: Left Heart Cath and Coronary Angiography;  Surgeon: Peter M Martinique, MD;  Location: [place] CV LAB;  Service: Cardiovascular;  Laterality: N/A;  . CARDIAC CATHETERIZATION N/A 05/31/2016   Procedure: Intravascular Pressure Wire/FFR Study;  Surgeon: Peter M Martinique, MD;  Location: [place] [facility];  Service: Cardiovascular;  Laterality: N/A;  . CARDIAC CATHETERIZATION N/A 05/31/2016   Procedure: Coronary Stent Intervention;  Surgeon: Peter M Martinique, MD;  Location: [place] [facility] LAB;  Service: Cardiovascular;  Laterality: N/A;  . COLONOSCOPY WITH PROPOFOL N/A 04/29/2020   Procedure: COLONOSCOPY WITH PROPOFOL;  Surgeon: Wilford Corner, MD;  Location: [place];  Service: Endoscopy;  Laterality: N/A;  . ESOPHAGOGASTRODUODENOSCOPY N/A 04/29/2020   Procedure: ESOPHAGOGASTRODUODENOSCOPY (EGD);  Surgeon: Wilford Corner, MD;  Location: [place];  Service: Endoscopy;  Laterality: N/A;  . LEFT HEART CATH AND CORONARY ANGIOGRAPHY N/A 12/08/2017   Procedure: LEFT HEART CATH AND CORONARY ANGIOGRAPHY;  Surgeon: Leonie Man, MD;  Location: [place] [facility];  Service: Cardiovascular;  Laterality: N/A;  . LEFT HEART CATHETERIZATION WITH CORONARY ANGIOGRAM N/A 02/03/2014   Procedure: LEFT HEART CATHETERIZATION WITH CORONARY ANGIOGRAM;  Surgeon: Pixie Casino, MD;  Location: [facility] CATH LAB;  Service: Cardiovascular;  Laterality: N/A;  . left leg stent     . POLYPECTOMY  04/29/2020   Procedure: POLYPECTOMY;  Surgeon: Wilford Corner, MD;  Location: [facility] ENDOSCOPY;   Service: Endoscopy;;     reports that he has never smoked. He has never used smokeless tobacco. He reports that he does not drink alcohol and does not use drugs.  Allergies  Allergen Reactions  . Coconut Flavor [Flavoring Agent] Hives  . Coconut Oil Hives  . Ibuprofen Other (See Comments)    Made gastric ulcers worse  . Aleve [Naproxen] Other (See Comments)    DUE TO KIDNEYS  . Nsaids Other (See Comments)    Stomach ulcers   . Vascepa [Icosapent Ethyl] Other (See Comments)    headaches, chest pain, similar to sx of a stroke, hypotension     Family History  Problem Relation Age of Onset  . Cancer Father   . Hypertension Mother   . Diabetes Mother   . Breast cancer Mother   . Hypertension Brother   . Diabetes Brother   . Hypertension Sister   . Diabetes Sister      Prior to Admission medications   Medication Sig Start Date End Date Taking? Authorizing Provider  Albuterol Sulfate (PROAIR RESPICLICK) 623 (90 Base) MCG/ACT AEPB Inhale 2 puffs into the lungs every 6 (six) hours as needed. Patient taking differently: Inhale 2 puffs into the lungs every 6 (six) hours as needed (for breathing).  11/06/18  Yes Robyn Haber, MD  allopurinol (ZYLOPRIM) 100 MG tablet Take 100 mg by mouth daily. 03/31/20  Yes [provider]  aspirin 81 MG chewable tablet Chew 1 tablet (81 mg total) by mouth daily. 06/01/16  Yes Elwin Mocha, MD  atorvastatin (LIPITOR) 40 MG tablet Take 1 tablet (40 mg total) by mouth daily. 02/12/20 07/17/21 Yes Hilty, Nadean Corwin, MD  carvedilol (COREG) 25 MG tablet Take 1 tablet (25 mg total) by mouth 2 (two) times daily with a meal. 01/26/17  Yes Strader, [place] M, PA-C  [facility] VITAMIN B12 1000 MCG tablet Take 1,000 mcg by mouth daily. 03/06/20  Yes [provider]  diclofenac (FLECTOR) 1.3 % PTCH Place 1 patch onto the skin daily as needed for pain. 03/02/20  Yes [provider]  DULoxetine (CYMBALTA) 60 MG capsule Take 1 capsule (60 mg total)  by mouth daily. Please request future refills from PCP. 11/28/19  Yes Marcial Pacas, MD  famotidine (PEPCID) 20 MG tablet Take 20 mg by mouth daily. 03/05/20  Yes [provider]  fenofibrate (TRICOR) 145 MG tablet Take 1 tablet (145 mg total) by mouth daily. 06/18/20  Yes Hilty, Nadean Corwin, MD  furosemide (LASIX) 40 MG tablet Take 1 tablet (40 mg total) by mouth 3 (three) times daily. 08/23/19 08/22/20 Yes Pahwani, Einar Grad, MD  glipiZIDE (GLUCOTROL) 10 MG tablet Take 10 mg by mouth 2 (two) times daily. 05/30/17  Yes [provider]  HUMULIN R U-500 KWIKPEN 500 UNIT/ML kwikpen Inject 0-85 Units into the skin in the morning, at noon, and at bedtime. Per sliding scale- 06/04/20  Yes [provider]  isosorbide mononitrate (IMDUR) 30 MG 24 hr tablet Take 1 tablet (30 mg total) by mouth daily. 10/16/19  Yes Lendon Colonel, NP  lisinopril (ZESTRIL) 5 MG tablet Take 5 mg by mouth daily. 03/05/20  Yes [provider]  nitroGLYCERIN (NITROSTAT) 0.4 MG SL tablet Place 1 tablet (0.4 mg total) under the tongue every 5 (five) minutes as needed for chest pain. 02/06/19  Yes Skeet Latch, MD  ondansetron (ZOFRAN) 4 MG tablet Take 4 mg by mouth 2 (two) times daily as needed for nausea/vomiting. 04/21/20  Yes [provider]  oxyCODONE (ROXICODONE) 15 MG immediate release tablet Take 15 mg 3 (three) times daily as needed by mouth for pain.  08/28/17  Yes [provider]  pantoprazole (PROTONIX) 40 MG tablet Take 1 tablet (40 mg total) by mouth 2 (two) times daily. 04/30/20  Yes Domenic Polite, MD  potassium chloride (MICRO-K) 10 MEQ CR capsule Take 10 mEq by mouth in the morning and at bedtime. 04/03/20  Yes [provider]  pregabalin (LYRICA) 300 MG capsule Take 300 mg by mouth 2 (two) times daily. 05/18/19  Yes [provider]  tiZANidine (ZANAFLEX) 4 MG tablet Take 4 mg by mouth 3 (three) times daily as needed for muscle spasms. 03/31/20  Yes [provider]  traZODone (DESYREL) 50 MG tablet Take 1 tablet (50 mg total) by mouth at bedtime. 12/15/16  Yes Burns, Arloa Koh, MD  Vitamin D, Ergocalciferol, (DRISDOL) 1.25 MG (50000 UT) CAPS capsule Take 50,000 Units by mouth once a week. mondays 08/15/19  Yes [provider]  Accu-Chek FastClix Lancets MISC 1 Device by Other route 2 (two) times daily.  03/20/19   [provider]  Insulin Pen Needle 31G X 5 MM MISC Use 1 needle daily to inject insulin as prescribed Patient taking differently: 1 each by Other route See admin instructions. Use 1 needle daily to inject insulin as prescribed 06/18/17   Barton Dubois, MD    Physical Exam: Vitals:   08/18/20 0200 08/18/20 0215 08/18/20 0230 08/18/20 0245  BP: (!) 153/75 (!) 184/85 (!) 158/86 (!) 172/88  Pulse: 87 84 87 88  Resp: (!) 23 (!) 23 19 17   Temp:      TempSrc:      SpO2: 96% 98% 96% 97%  Weight:      Height:        Constitutional: NAD, calm, comfortable Eyes: PERRL, lids and conjunctivae normal ENMT: Mucous membranes are moist. Posterior pharynx clear of any exudate or lesions.Normal dentition.  Neck: normal, supple, no masses, no thyromegaly Respiratory: clear to auscultation bilaterally, no wheezing, no crackles. Normal respiratory effort. No accessory muscle use.  Cardiovascular: Regular rate and rhythm, no murmurs / rubs / gallops. No extremity edema. 2+ pedal pulses. No carotid bruits.  Abdomen: no tenderness, no masses palpated. No hepatosplenomegaly. Bowel sounds positive.  Musculoskeletal: no clubbing / cyanosis. No joint deformity upper and lower extremities. Good ROM, no contractures. Normal muscle tone.  Skin: no rashes, lesions, ulcers. No induration Neurologic: CN 2-12 grossly intact. Sensation intact, DTR normal. Strength 5/5 in all 4.  Psychiatric: Normal judgment and insight. Alert and oriented x 3. Normal mood.    Labs on Admission: I have personally reviewed following labs and imaging  studies  CBC: Recent Labs  Lab 08/18/20 0220  WBC 13.2*  NEUTROABS 11.9*  HGB 14.0  HCT 41.0  MCV 92.1  PLT 734   Basic Metabolic Panel: Recent Labs  Lab 08/18/20 0057  NA 135  K 5.0  CL 96*  CO2 17*  GLUCOSE 612*  BUN 27*  CREATININE 1.49*  CALCIUM 9.9   GFR: Estimated Creatinine Clearance: 82.6 mL/min (A) (by C-G formula based on SCr of 1.49 mg/dL (H)). Liver Function Tests: No results for input(s): AST, ALT, ALKPHOS, BILITOT, PROT, ALBUMIN in the last 168 hours. No results for input(s): LIPASE, AMYLASE in the last 168 hours. No results for input(s): AMMONIA in the last 168 hours. Coagulation Profile: No results for input(s): INR, PROTIME in the last 168 hours. Cardiac Enzymes: No results for input(s): CKTOTAL, CKMB, CKMBINDEX, TROPONINI in the last 168 hours. BNP (last 3 results) No results for input(s): PROBNP in the last 8760 hours. HbA1C: No results for input(s): HGBA1C in the last 72 hours. CBG: Recent Labs  Lab 08/18/20 0033  GLUCAP 555*   Lipid Profile: No results for input(s): CHOL, HDL, LDLCALC, TRIG, CHOLHDL, LDLDIRECT in the last 72 hours. Thyroid Function Tests: No results for input(s): TSH, T4TOTAL, FREET4, T3FREE, THYROIDAB in the last 72 hours. Anemia Panel: No results for input(s): VITAMINB12, FOLATE, FERRITIN, TIBC, IRON, RETICCTPCT in the last 72 hours. Urine analysis:    Component Value Date/Time   COLORURINE COLORLESS (A) 08/18/2020 0044   APPEARANCEUR CLEAR 08/18/2020 0044   LABSPEC 1.020 08/18/2020 0044   PHURINE 5.0 08/18/2020 0044   GLUCOSEU >=500 (A) 08/18/2020 0044   HGBUR NEGATIVE 08/18/2020 0044   BILIRUBINUR NEGATIVE 08/18/2020 0044   KETONESUR 20 (A) 08/18/2020 0044   PROTEINUR NEGATIVE 08/18/2020 0044   UROBILINOGEN 0.2 05/27/2014 2312   NITRITE NEGATIVE 08/18/2020 0044   LEUKOCYTESUR NEGATIVE 08/18/2020 0044    Radiological Exams on Admission: No results found.  EKG: Independently  reviewed.  Assessment/Plan Principal Problem:   DKA (diabetic ketoacidosis) ([place]) Active Problems:   HTN (hypertension)   Type 2 diabetes mellitus with vascular disease ([place])   AKI (acute kidney injury) ([place])   Chronic diastolic CHF (congestive heart failure) ([place])    1. DKA - 1. DKA pathway 2. IVF per pathway 3. Repeat lactate to make sure this clears 4. BMP Q4H 5. Insulin GTT 2. AKI - 1. Suspect pre-renal due to dehydration associated with DKA 2. IVF 3. Intake and output 4. Follow serial BMPs 3. HTN - 1. Holding lisinopril due to AKI 2. Cont coreg 3. Cont imdur 4. dCHF - 1. Hold lasix due to AKI  DVT prophylaxis: Lovenox Code Status: Full Family Communication: No family in room Disposition Plan: Home after DKA and AKI resolved Consults called: None Admission status: Admit to inpatient  Severity of Illness: The appropriate patient status for this patient is INPATIENT. Inpatient status is judged to be reasonable and necessary in order to provide the required intensity of service to ensure the patient's safety. The patient's presenting symptoms, physical exam findings, and initial radiographic and laboratory data in the context of their chronic comorbidities is felt to place them at high risk for further clinical deterioration. Furthermore, it is not anticipated that the patient will be medically stable for discharge from the hospital within 2 midnights of admission. The following factors support the patient status of inpatient.   IP status for treatment of DKA.   * I certify that at the point of admission it is my clinical judgment that the patient will require inpatient hospital care spanning beyond 2 midnights from the point of admission due to high intensity of service, high risk for further deterioration and high frequency of surveillance required.*  [name] Jerilynn Mages DO [facility]  How to contact the [facility] Attending or Consulting provider [place] or  covering provider during after hours [place], for this patient?  1. Check the care team in [facility] and look for a) attending/consulting [facility] provider listed and b) the [facility] team listed 2. Log into [email or web address]  Amion Physician Scheduling and messaging for groups and whole hospitals  On call and physician scheduling software for group practices, residents, hospitalists and other medical providers for call, clinic, rotation and shift schedules. On[employer] is a hospital-wide system for scheduling doctors and paging doctors on call. EasyPlot is for scientific plotting and data analysis.  [email or web address]  and use [place]'s universal password to access. If you do not have the password, please contact the hospital operator.  3. Locate the [facility] provider you are looking for under [facility] and page to a number that you can be directly reached. 4. If you still have difficulty reaching the provider, please page the [facility] (Director on Call) for the Hospitalists listed on amion for assistance.  08/18/2020, 3:24 AM

## 2020-08-19 ENCOUNTER — Ambulatory Visit: Payer: Medicaid Other | Admitting: Occupational Therapy

## 2020-08-19 DIAGNOSIS — G4733 Obstructive sleep apnea (adult) (pediatric): Secondary | ICD-10-CM

## 2020-08-19 LAB — BASIC METABOLIC PANEL
Anion gap: 13 (ref 5–15)
BUN: 18 mg/dL (ref 6–20)
CO2: 25 mmol/L (ref 22–32)
Calcium: 9.2 mg/dL (ref 8.9–10.3)
Chloride: 102 mmol/L (ref 98–111)
Creatinine, Ser: 0.69 mg/dL (ref 0.61–1.24)
GFR, Estimated: >60 mL/min (ref >60)
Glucose, Bld: 274 mg/dL — ABNORMAL HIGH (ref 70–99)
Potassium: 3.6 mmol/L (ref 3.5–5.1)
Sodium: 140 mmol/L (ref 135–145)

## 2020-08-19 LAB — CBC WITH DIFFERENTIAL/PLATELET
Abs Immature Granulocytes: 0.04 10*3/uL (ref 0.00–0.07)
Basophils Absolute: 0 10*3/uL (ref 0.0–0.1)
Basophils Relative: 0 %
Eosinophils Absolute: 0.1 10*3/uL (ref 0.0–0.5)
Eosinophils Relative: 1 %
HCT: 35.4 % — ABNORMAL LOW (ref 39.0–52.0)
Hemoglobin: 11.9 g/dL — ABNORMAL LOW (ref 13.0–17.0)
Immature Granulocytes: 0 %
Lymphocytes Relative: 26 %
Lymphs Abs: 2.5 10*3/uL (ref 0.7–4.0)
MCH: 31.2 pg (ref 26.0–34.0)
MCHC: 33.6 g/dL (ref 30.0–36.0)
MCV: 92.9 fL (ref 80.0–100.0)
Monocytes Absolute: 0.7 10*3/uL (ref 0.1–1.0)
Monocytes Relative: 7 %
Neutro Abs: 6.4 10*3/uL (ref 1.7–7.7)
Neutrophils Relative %: 66 %
Platelets: 190 10*3/uL (ref 150–400)
RBC: 3.81 MIL/uL — ABNORMAL LOW (ref 4.22–5.81)
RDW: 13.2 % (ref 11.5–15.5)
WBC: 9.8 10*3/uL (ref 4.0–10.5)
nRBC: 0 % (ref 0.0–0.2)

## 2020-08-19 LAB — COMPREHENSIVE METABOLIC PANEL
ALT: 19 U/L (ref 0–44)
AST: 21 U/L (ref 15–41)
Albumin: 3.6 g/dL (ref 3.5–5.0)
Alkaline Phosphatase: 46 U/L (ref 38–126)
Anion gap: 16 — ABNORMAL HIGH (ref 5–15)
BUN: 17 mg/dL (ref 6–20)
CO2: 22 mmol/L (ref 22–32)
Calcium: 9.2 mg/dL (ref 8.9–10.3)
Chloride: 99 mmol/L (ref 98–111)
Creatinine, Ser: 0.85 mg/dL (ref 0.61–1.24)
GFR, Estimated: >60 mL/min (ref >60)
Glucose, Bld: 296 mg/dL — ABNORMAL HIGH (ref 70–99)
Potassium: 3.9 mmol/L (ref 3.5–5.1)
Sodium: 137 mmol/L (ref 135–145)
Total Bilirubin: 0.6 mg/dL (ref 0.3–1.2)
Total Protein: 6.6 g/dL (ref 6.5–8.1)

## 2020-08-19 LAB — GLUCOSE, CAPILLARY
Glucose-Capillary: 266 mg/dL — ABNORMAL HIGH (ref 70–99)
Glucose-Capillary: 270 mg/dL — ABNORMAL HIGH (ref 70–99)
Glucose-Capillary: 231 mg/dL — ABNORMAL HIGH (ref 70–99)
Glucose-Capillary: 291 mg/dL — ABNORMAL HIGH (ref 70–99)

## 2020-08-19 LAB — MAGNESIUM: Magnesium: 2 mg/dL (ref 1.7–2.4)

## 2020-08-19 LAB — PHOSPHORUS: Phosphorus: 3.2 mg/dL (ref 2.5–4.6)

## 2020-08-19 LAB — HEMOGLOBIN A1C
Hgb A1c MFr Bld: 10.6 % — ABNORMAL HIGH (ref 4.8–5.6)
Mean Plasma Glucose: 258 mg/dL

## 2020-08-19 MED ORDER — INSULIN ASPART 100 UNIT/ML ~~LOC~~ SOLN
10.0000 [IU] | Freq: Three times a day (TID) | SUBCUTANEOUS | Status: DC
  Administered 2020-08-19: 10 [IU] via SUBCUTANEOUS

## 2020-08-19 MED ORDER — INSULIN ASPART 100 UNIT/ML ~~LOC~~ SOLN
5.0000 [IU] | Freq: Three times a day (TID) | SUBCUTANEOUS | Status: DC
  Administered 2020-08-19 (×2): 5 [IU] via SUBCUTANEOUS

## 2020-08-19 MED ORDER — SODIUM CHLORIDE 0.9 % IV BOLUS
500.0000 mL | Freq: Once | INTRAVENOUS | Status: AC
  Administered 2020-08-19: 500 mL via INTRAVENOUS

## 2020-08-19 MED ORDER — INSULIN GLARGINE 100 UNIT/ML ~~LOC~~ SOLN
120.0000 [IU] | Freq: Every day | SUBCUTANEOUS | Status: DC
  Filled 2020-08-19: qty 1.2

## 2020-08-19 MED ORDER — SODIUM CHLORIDE 0.9 % IV SOLN: INTRAVENOUS | Status: DC

## 2020-08-19 MED ORDER — INSULIN GLARGINE 100 UNIT/ML ~~LOC~~ SOLN
20.0000 [IU] | Freq: Once | SUBCUTANEOUS | Status: AC
  Administered 2020-08-19: 20 [IU] via SUBCUTANEOUS
  Filled 2020-08-19: qty 0.2

## 2020-08-19 MED ORDER — SODIUM CHLORIDE 0.9 % IV BOLUS: 1000.0000 mL | Freq: Once | INTRAVENOUS | Status: DC

## 2020-08-19 NOTE — Progress Notes (Signed)
Inpatient Diabetes Program Recommendations  AACE/ADA: New Consensus Statement on Inpatient Glycemic Control (2015)  Target Ranges:  Prepandial:   less than 140 mg/dL      Peak postprandial:   less than 180 mg/dL (1-2 hours)      Critically ill patients:  140 - 180 mg/dL   Lab Results  Component Value Date   GLUCAP 270 (H) 08/19/2020   HGBA1C 10.6 (H) 08/18/2020    Review of Glycemic Control  Diabetes history: DM2 Outpatient Diabetes medications: U Regular 500 -See below Current orders for Inpatient glycemic control:Lantus 100 units + Novolog 5 units tid meal coverage + Novolog resistant correction tid + hs 0-5 units  Inpatient Diabetes Program Recommendations:   Spoke with patient regarding insulin regimen and patient uses the scale listed from this doctor's office in U 500 insulin. If patient is stable and will be eating: -Consider restarting U500 insulin starting in am tid  Would recommend starting 45 units tid  Or if patient continues on Lantus consider: -Increase Novolog meal coverage to 10 units tid starting   Thank you, Nani Gasser. Shae Hinnenkamp, RN, MSN, CDE  Diabetes Coordinator Inpatient Glycemic Control Team Team Pager [phone] (8am-5pm) 08/19/2020 3:36 PM

## 2020-08-19 NOTE — Progress Notes (Signed)
PROGRESS NOTE    [name]  PYK:[id number] DOB: 1961-01-02 DOA: 08/18/2020 PCP: Juan Halsted, FNP    Chief Complaint  Patient presents with  . Hyperglycemia    Patient recieved a steroid shot today for chronic back pain, went home and realized that everytime he checked his glucose level it was elevating, the highest was 597, its now down to 546.    Brief Narrative: HPI per Dr. Jacklynn Stein is a 59 y.o. male with medical history significant of DM2 poorly controlled, CAD s/p stent, dCHF, HTN, chronic back pain.  Pt had IM steroid injection today for back pain.  Looks like he is scheduled for epidural injection in Nov.  Had steroid injection at around 330pm.  Checked BGL after that was 461.  Had headache when he got home.  Ate, rechecked BGL 2h later around 10pm and it was 597.  Headache resolved and patient actually feels fine.  Did have episode of vomiting after steroid injection but no N/V now.  But presents to ED for elevated BGL.  No CP, SOB at this time (though has been having these chronically for a while he says).  Has polyuria and thirst.  Symptoms are mild.  Nothing makes symptoms better or worse.  Symptoms are persistent.   ED Course: Pt with dehydration and DKA.  AG 22.  PH 7.29, PCO2 15.4, bicarb 7.2 on the VBG.  Bicarb is 17 though on BMP.  Not sure why the discrepancy.  Lactate 5.2, BHB 1.1.  Getting multiple L bolus and insulin gtt now.  Assessment & Plan:   Principal Problem:   DKA (diabetic ketoacidosis) ([place]) Active Problems:   HTN (hypertension)   Type 2 diabetes mellitus with vascular disease ([place])   AKI (acute kidney injury) ([place])   Chronic diastolic CHF (congestive heart failure) ([place])  #1 DKA Likely secondary to epidural steroid shot.  Patient admitted after receiving epidural steroid shot noted to have elevated blood glucose levels on presentation of 539 on basic metabolic profile.  Venous blood gas with a PCO2  of 15, PO2 of 57, pH of 7.291, bicarb of 7.2.  Patient noted to have an anion gap of 22.  Lactic acid was initially elevated at 5.2 but trended down.  Beta hydroxybutyrate elevated at 1.06.  Hemoglobin A1c 10.6 (08/18/2020).  Patient noted to be afebrile.  Leukocytosis trended down.  Urinalysis colorless, nitrite negative, leukocytes negative, 20 ketones, greater than 500 glucose.  Patient with no signs or symptoms of infection.  Patient denies any chest pain.  Patient was placed on the insulin drip and subsequently transition to subcutaneous insulin after anion gap closed and blood glucose levels have improved.  Patient on a carb modified diet.  Basic metabolic profile from earlier this morning noted anion gap rising at 16.  We will give a bolus of 500 cc normal saline, repeat basic metabolic profile.  Continue hydration with IV fluids.  Continue current dose of Lantus at 100 units daily may need to uptitrate.  Placed on NovoLog 5 units 3 times daily with meals.  Will need close outpatient follow-up with endocrinologist.  Follow.  2.  Acute kidney injury Felt secondary to prerenal azotemia secondary to dehydration associated with DKA.  Renal function improved with hydration.  ACE inhibitor on hold.  Follow.  3.  Hypertension Continue Coreg and Imdur.  ACE inhibitor on hold secondary to problem #2.  If kidney function continues to remain stable could likely reintroduce ACE inhibitor in the  next 1 to 2 days.  Follow.  4.  Chronic diastolic CHF/coronary artery disease status post stent Stable.  Patient with no signs of volume overload.  Monitor closely with hydration.  Continue Coreg, Lipitor, aspirin, Imdur.  ACE inhibitor currently on hold.  Continue to hold Lasix.  Follow.  5.  Morbid obesity  6.  Hyperlipidemia Continue statin.  7.  OSA CPAP nightly.   DVT prophylaxis: Lovenox Code Status: Full Family Communication: Updated patient.  No family at bedside. Disposition:   Status is:  Inpatient    Dispo: The patient is from: Home              Anticipated d/c is to: Home              Anticipated d/c date is: Hopefully tomorrow.  1 to 2 days.              Patient currently admitted for DKA, transition to subcutaneous insulin, anion gap rising, not stable for discharge.       Consultants:   None  Procedures:  Chest x-ray 08/22/2019    Antimicrobials:  None   Subjective: Sitting up in chair. Denies any shortness of breath. Denies any chest pain. States he is hungry. Denies any headache. Feeling better than he did on admission.  Objective: Vitals:   08/19/20 0200 08/19/20 0300 08/19/20 0400 08/19/20 0800  BP: 130/65 (!) 163/91    Pulse: 80 73 77   Resp: 15 15 14    Temp:   97.7 F (36.5 C) 97.8 F (36.6 C)  TempSrc:   Oral Axillary  SpO2: 94% 92% 94%   Weight:      Height:        Intake/Output Summary (Last 24 hours) at 08/19/2020 0951 Last data filed at 08/18/2020 2000 Gross per 24 hour  Intake 2529.11 ml  Output --  Net 2529.11 ml   Filed Weights   08/18/20 0035  Weight: (!) 154.3 kg    Examination:  General exam: Appears calm and comfortable  Respiratory system: Clear to auscultation. Respiratory effort normal. Cardiovascular system: S1 & S2 heard, RRR. No JVD, murmurs, rubs, gallops or clicks. No pedal edema. Gastrointestinal system: Abdomen is nondistended, soft and nontender. No organomegaly or masses felt. Normal bowel sounds heard. Central nervous system: Alert and oriented. No focal neurological deficits. Extremities: Symmetric 5 x 5 power. Skin: No rashes, lesions or ulcers Psychiatry: Judgement and insight appear normal. Mood & affect appropriate.     Data Reviewed: I have personally reviewed following labs and imaging studies  CBC: Recent Labs  Lab 08/18/20 0220 08/19/20 0240  WBC 13.2* 9.8  NEUTROABS 11.9* 6.4  HGB 14.0 11.9*  HCT 41.0 35.4*  MCV 92.1 92.9  PLT 224 160    Basic Metabolic Panel: Recent  Labs  Lab 08/18/20 0824 08/18/20 1042 08/18/20 1529 08/18/20 2020 08/19/20 0240  NA 139 137 139 136 137  K 3.7 3.8 3.6 3.7 3.9  CL 103 101 102 102 99  CO2 22 23 25 23 22   GLUCOSE 280* 260* 246* 293* 296*  BUN 20 20 17 19 17   CREATININE 0.88 0.81 0.73 1.12 0.85  CALCIUM 9.4 9.1 9.0 8.8* 9.2  MG  --   --   --   --  2.0  PHOS  --   --   --   --  3.2    GFR: Estimated Creatinine Clearance: 144.8 mL/min (by C-G formula based on SCr of 0.85 mg/dL).  Liver Function  Tests: Recent Labs  Lab 08/18/20 0824 08/19/20 0240  AST 20 21  ALT 19 19  ALKPHOS 50 46  BILITOT 0.8 0.6  PROT 6.7 6.6  ALBUMIN 3.6 3.6    CBG: Recent Labs  Lab 08/18/20 1726 08/18/20 1823 08/18/20 1931 08/18/20 2128 08/19/20 0738  GLUCAP 211* 212* 268* 294* 266*     Recent Results (from the past 240 hour(s))  Respiratory Panel by RT PCR (Flu A&B, Covid) - Nasopharyngeal Swab     Status: None   Collection Time: 08/18/20  2:20 AM   Specimen: Nasopharyngeal Swab  Result Value Ref Range Status   SARS Coronavirus 2 by RT PCR NEGATIVE NEGATIVE Final    Comment: (NOTE) SARS-CoV-2 target nucleic acids are NOT DETECTED.  The SARS-CoV-2 RNA is generally detectable in upper respiratoy specimens during the acute phase of infection. The lowest concentration of SARS-CoV-2 viral copies this assay can detect is 131 copies/mL. A negative result does not preclude SARS-Cov-2 infection and should not be used as the sole basis for treatment or other patient management decisions. A negative result may occur with  improper specimen collection/handling, submission of specimen other than nasopharyngeal swab, presence of viral mutation(s) within the areas targeted by this assay, and inadequate number of viral copies (<131 copies/mL). A negative result must be combined with clinical observations, patient history, and epidemiological information. The expected result is Negative.  Fact Sheet for Patients:    PinkCheek.be  Fact Sheet for Healthcare Providers:  GravelBags.it  This test is no t yet approved or cleared by the [place] FDA and  has been authorized for detection and/or diagnosis of SARS-CoV-2 by FDA under an Emergency Use Authorization (EUA). This EUA will remain  in effect (meaning this test can be used) for the duration of the COVID-19 declaration under Section 564(b)(1) of the Act, 21 U.S.C. section 360bbb-3(b)(1), unless the authorization is terminated or revoked sooner.     Influenza A by PCR NEGATIVE NEGATIVE Final   Influenza B by PCR NEGATIVE NEGATIVE Final    Comment: (NOTE) The Xpert Xpress SARS-CoV-2/FLU/RSV assay is intended as an aid in  the diagnosis of influenza from Nasopharyngeal swab specimens and  should not be used as a sole basis for treatment. Nasal washings and  aspirates are unacceptable for Xpert Xpress SARS-CoV-2/FLU/RSV  testing.  Fact Sheet for Patients: PinkCheek.be  Fact Sheet for Healthcare Providers: GravelBags.it  This test is not yet approved or cleared by the [place] FDA and  has been authorized for detection and/or diagnosis of SARS-CoV-2 by  FDA under an Emergency Use Authorization (EUA). This EUA will remain  in effect (meaning this test can be used) for the duration of the  Covid-19 declaration under Section 564(b)(1) of the Act, 21  U.S.C. section 360bbb-3(b)(1), unless the authorization is  terminated or revoked. Performed at [facility], [place] [address], [place], [place] [address]   MRSA PCR Screening     Status: None   Collection Time: 08/18/20  4:53 AM   Specimen: Nasopharyngeal  Result Value Ref Range Status   MRSA by PCR NEGATIVE NEGATIVE Final    Comment:        The GeneXpert MRSA Assay (FDA approved for NASAL specimens only), is one component of a comprehensive MRSA  colonization surveillance program. It is not intended to diagnose MRSA infection nor to guide or monitor treatment for MRSA infections. Performed at [facility], [place] [address] [place], [place] [address]  Radiology Studies: No results found.      Scheduled Meds: . allopurinol  100 mg Oral Daily  . aspirin  81 mg Oral Daily  . atorvastatin  40 mg Oral Daily  . carvedilol  25 mg Oral BID WC  . Chlorhexidine Gluconate Cloth  6 each Topical Daily  . DULoxetine  60 mg Oral Daily  . enoxaparin (LOVENOX) injection  80 mg Subcutaneous Q24H  . famotidine  20 mg Oral QHS  . fenofibrate  160 mg Oral Daily  . insulin aspart  0-20 Units Subcutaneous TID WC  . insulin aspart  0-5 Units Subcutaneous QHS  . insulin aspart  5 Units Subcutaneous TID WC  . insulin glargine  100 Units Subcutaneous Daily  . isosorbide mononitrate  30 mg Oral Daily  . pantoprazole  40 mg Oral BID  . pregabalin  300 mg Oral BID  . traZODone  50 mg Oral QHS   Continuous Infusions: . sodium chloride    . dextrose 5% lactated ringers Stopped (08/18/20 1913)  . insulin Stopped (08/18/20 1932)     LOS: 1 day    Time spent: 35 minutes    Irine Seal, MD [facility]   To contact the attending provider between 7A-7P or the covering provider during after hours 7P-7A, please log into the web site [email or web address] and access using universal [place] password for that web site. If you do not have the password, please call the hospital operator.  08/19/2020, 9:51 AM

## 2020-08-19 NOTE — TOC Progression Note (Signed)
Transition of Care [facility]) - Progression Note    Patient Details  Name: [name] MRN: [id number] Date of Birth: [birth year]  [facility] of [facility]) CM/SW Contact  Leeroy Cha, RN Phone Number: 08/19/2020, 7:59 AM  Clinical Narrative:    59 y.o. male with medical history significant of DM2 poorly controlled, CAD s/p stent, dCHF, HTN, chronic back pain.  Pt had IM steroid injection today for back pain.  Looks like he is scheduled for epidural injection in Nov.  Had steroid injection at around 330pm.  Checked BGL after that was 461.  Had headache when he got home.  Ate, rechecked BGL 2h later around 10pm and it was 597.  Headache resolved and patient actually feels fine.  Did have episode of vomiting after steroid injection but no N/V now.  But presents to ED for elevated BGL.  No CP, SOB at this time (though has been having these chronically for a while he says).  Has polyuria and thirst.  Symptoms are mild.  Nothing makes symptoms better or worse.  Symptoms are persistent. iv d5lr at 125cc/hr, iv insulin, bld glucose this am is 296 cap-266, following for toc needs Diabetic coordinator to see Following for progression.  ED Course: Pt with dehydration and DKA.  AG 22.  PH 7.29, PCO2 15.4, bicarb 7.2 on the VBG.  Bicarb is 17 though on BMP.  Not sure why the discrepancy.  Lactate 5.2, BHB 1.1.  Getting multiple L bolus and insulin gtt now.   Expected Discharge Plan: Home/Self Care Barriers to Discharge: Barriers Unresolved (comment) (iv insulin)  Expected Discharge Plan and Services Expected Discharge Plan: Home/Self Care   Discharge Planning Services: CM Consult   Living arrangements for the past 2 months: Single Family Home                                       Social Determinants of Health (SDOH) Interventions    Readmission Risk Interventions No flowsheet data found.

## 2020-08-19 NOTE — Progress Notes (Signed)
Patient refuses nocturnal CPAP and denies consistent use at home.

## 2020-08-20 DIAGNOSIS — I160 Hypertensive urgency: Secondary | ICD-10-CM

## 2020-08-20 LAB — BASIC METABOLIC PANEL
Anion gap: 11 (ref 5–15)
BUN: 18 mg/dL (ref 6–20)
CO2: 24 mmol/L (ref 22–32)
Calcium: 9.1 mg/dL (ref 8.9–10.3)
Chloride: 100 mmol/L (ref 98–111)
Creatinine, Ser: 0.75 mg/dL (ref 0.61–1.24)
GFR, Estimated: >60 mL/min (ref >60)
Glucose, Bld: 235 mg/dL — ABNORMAL HIGH (ref 70–99)
Potassium: 3.8 mmol/L (ref 3.5–5.1)
Sodium: 135 mmol/L (ref 135–145)

## 2020-08-20 LAB — CBC
HCT: 35.3 % — ABNORMAL LOW (ref 39.0–52.0)
Hemoglobin: 12 g/dL — ABNORMAL LOW (ref 13.0–17.0)
MCH: 31.7 pg (ref 26.0–34.0)
MCHC: 34 g/dL (ref 30.0–36.0)
MCV: 93.1 fL (ref 80.0–100.0)
Platelets: 171 10*3/uL (ref 150–400)
RBC: 3.79 MIL/uL — ABNORMAL LOW (ref 4.22–5.81)
RDW: 12.9 % (ref 11.5–15.5)
WBC: 7.4 10*3/uL (ref 4.0–10.5)
nRBC: 0 % (ref 0.0–0.2)

## 2020-08-20 LAB — GLUCOSE, CAPILLARY
Glucose-Capillary: 190 mg/dL — ABNORMAL HIGH (ref 70–99)
Glucose-Capillary: 244 mg/dL — ABNORMAL HIGH (ref 70–99)
Glucose-Capillary: 188 mg/dL — ABNORMAL HIGH (ref 70–99)
Glucose-Capillary: 232 mg/dL — ABNORMAL HIGH (ref 70–99)

## 2020-08-20 LAB — MAGNESIUM: Magnesium: 2.1 mg/dL (ref 1.7–2.4)

## 2020-08-20 MED ORDER — POTASSIUM CHLORIDE CRYS ER 20 MEQ PO TBCR
20.0000 meq | EXTENDED_RELEASE_TABLET | Freq: Every day | ORAL | Status: DC
  Administered 2020-08-20: 20 meq via ORAL
  Filled 2020-08-20: qty 1

## 2020-08-20 MED ORDER — LISINOPRIL 2.5 MG PO TABS
5.0000 mg | ORAL_TABLET | Freq: Once | ORAL | Status: AC
  Administered 2020-08-20: 5 mg via ORAL
  Filled 2020-08-20: qty 2

## 2020-08-20 MED ORDER — LISINOPRIL 2.5 MG PO TABS
5.0000 mg | ORAL_TABLET | Freq: Every day | ORAL | Status: DC
  Administered 2020-08-20: 5 mg via ORAL
  Filled 2020-08-20 (×2): qty 2

## 2020-08-20 MED ORDER — LISINOPRIL 2.5 MG PO TABS: 5.0000 mg | ORAL_TABLET | Freq: Every day | ORAL | Status: DC

## 2020-08-20 MED ORDER — FUROSEMIDE 40 MG PO TABS
40.0000 mg | ORAL_TABLET | ORAL | Status: DC
  Administered 2020-08-20 – 2020-08-21 (×3): 40 mg via ORAL
  Filled 2020-08-20 (×4): qty 1

## 2020-08-20 MED ORDER — INSULIN REGULAR HUMAN (CONC) 500 UNIT/ML ~~LOC~~ SOPN
45.0000 [IU] | PEN_INJECTOR | Freq: Three times a day (TID) | SUBCUTANEOUS | Status: DC
  Administered 2020-08-20 (×3): 45 [IU] via SUBCUTANEOUS
  Filled 2020-08-20: qty 3

## 2020-08-20 MED ORDER — FUROSEMIDE 40 MG PO TABS: 40.0000 mg | ORAL_TABLET | Freq: Three times a day (TID) | ORAL | Status: DC

## 2020-08-20 MED ORDER — LISINOPRIL 10 MG PO TABS
10.0000 mg | ORAL_TABLET | Freq: Every day | ORAL | Status: DC
  Administered 2020-08-21: 10 mg via ORAL
  Filled 2020-08-20: qty 1

## 2020-08-20 MED ORDER — LABETALOL HCL 5 MG/ML IV SOLN
10.0000 mg | Freq: Once | INTRAVENOUS | Status: AC
  Administered 2020-08-20: 10 mg via INTRAVENOUS
  Filled 2020-08-20: qty 4

## 2020-08-20 MED ORDER — FUROSEMIDE 40 MG PO TABS
40.0000 mg | ORAL_TABLET | Freq: Three times a day (TID) | ORAL | Status: DC
  Administered 2020-08-20: 40 mg via ORAL
  Filled 2020-08-20: qty 1

## 2020-08-20 NOTE — Progress Notes (Signed)
PROGRESS NOTE    [name]  IWL:[id number] DOB: [birth year] DOA: 08/18/2020 PCP: [name], FNP    Chief Complaint  Patient presents with  . Hyperglycemia    Patient recieved a steroid shot today for chronic back pain, went home and realized that everytime he checked his glucose level it was elevating, the highest was 597, its now down to 546.    Brief Narrative: HPI per [name] is a 59 y.o. male with medical history significant of DM2 poorly controlled, CAD s/p stent, dCHF, HTN, chronic back pain.  Pt had IM steroid injection today for back pain.  Looks like he is scheduled for epidural injection in Nov.  Had steroid injection at around 330pm.  Checked BGL after that was 461.  Had headache when he got home.  Ate, rechecked BGL 2h later around 10pm and it was 597.  Headache resolved and patient actually feels fine.  Did have episode of vomiting after steroid injection but no N/V now.  But presents to ED for elevated BGL.  No CP, SOB at this time (though has been having these chronically for a while he says).  Has polyuria and thirst.  Symptoms are mild.  Nothing makes symptoms better or worse.  Symptoms are persistent.   ED Course: Pt with dehydration and DKA.  AG 22.  PH 7.29, PCO2 15.4, bicarb 7.2 on the VBG.  Bicarb is 17 though on BMP.  Not sure why the discrepancy.  Lactate 5.2, BHB 1.1.  Getting multiple L bolus and insulin gtt now.  Assessment & Plan:   Principal Problem:   DKA (diabetic ketoacidosis) ([name]:   HTN (hypertension)   Type 2 diabetes mellitus with vascular disease ([place])   AKI (acute kidney injury) ([place])   Chronic diastolic CHF (congestive heart failure) ([facility])   Hypertensive urgency   OSA (obstructive sleep apnea)  1 DKA Likely secondary to epidural steroid shot.  Patient admitted after receiving epidural steroid shot noted to have elevated blood glucose levels on presentation of 612 on  basic metabolic profile.  Venous blood gas with a PCO2 of 15, PO2 of 57, pH of 7.291, bicarb of 7.2.  Patient noted to have an anion gap of 22.  Lactic acid was initially elevated at 5.2 but trended down.  Beta hydroxybutyrate elevated at 1.06.  Hemoglobin A1c 10.6 (08/18/2020).  Patient noted to be afebrile.  Leukocytosis trended down and resolved.  Urinalysis colorless, nitrite negative, leukocytes negative, 20 ketones, greater than 500 glucose.  Patient with no signs or symptoms of infection.  Patient denies any chest pain.  Patient was placed on the insulin drip and subsequently transition to subcutaneous insulin after anion gap closed and blood glucose levels have improved.  Patient on a carb modified diet.  Anion gap was rising at 16 on 08/19/2020 however has since resolved and currently at 11.  Saline lock IV fluids.  Patient was placed on Lantus 120 units daily yesterday.  We will transition back to home regimen of U5 145 units 3 times daily and uptitrate as needed.  Discontinue NovoLog meal coverage.  Sliding scale insulin.  Will need close outpatient follow-up with endocrinologist.  Diabetic coordinator following.   2.  Acute kidney injury Felt secondary to prerenal azotemia secondary to dehydration associated with DKA.  Renal function improved with hydration.  Resume ACE inhibitor and diuretics and follow.   3.  Hypertension/hypertensive urgency. Patient noted to have systolic blood pressures in the 170s  to 200s.  Continue current regimen of Coreg and Imdur.  Resume home regimen of ACE inhibitor and Lasix.  Monitor renal function closely.  If further blood pressure control needed could uptitrate ACE inhibitor if renal function stable.    4.  Chronic diastolic CHF/coronary artery disease status post stent Stable.  Patient with no signs of significant volume overload.  IV fluids have been saline locked.  Continue Coreg, Lipitor, aspirin, Imdur.  Resume home regiment ACE inhibitor and Lasix.   Follow.   5.  Morbid obesity  6.  Hyperlipidemia Statin.   7.  OSA CPAP nightly.   DVT prophylaxis: Lovenox Code Status: Full Family Communication: Updated patient.  No family at bedside. Disposition:   Status is: Inpatient    Dispo: The patient is from: Home              Anticipated d/c is to: Home              Anticipated d/c date is: Hopefully tomorrow.  1 to 2 days.              Patient currently admitted for DKA, transitioned to subcutaneous insulin.  Blood pressure is elevated with systolic blood pressures [id number].  Not stable for discharge.       Consultants:   None  Procedures:  Chest x-ray 08/22/2019    Antimicrobials:  None   Subjective: Sitting up in chair.  Complains of back pain.  Denies any chest pain no shortness of breath.  Denies any headache.  Tolerating diet.  Feeling better than he did on admission.  Blood pressures noted to be elevated in the 169C to 789F systolic.   Objective: Vitals:   08/20/20 0600 08/20/20 0615 08/20/20 0800 08/20/20 0802  BP: (!) 171/62 (!) 179/93 (!) 187/98   Pulse: 66 72 63   Resp: 17 16 19    Temp:    98.1 F (36.7 C)  TempSrc:    Oral  SpO2: 95% 95% 99%   Weight:      Height:        Intake/Output Summary (Last 24 hours) at 08/20/2020 0925 Last data filed at 08/20/2020 0245 Gross per 24 hour  Intake 1990.01 ml  Output 1500 ml  Net 490.01 ml   Filed Weights   08/18/20 0035  Weight: (!) 154.3 kg    Examination:  General exam: NAD Respiratory system: Lungs clear to auscultation bilaterally.  No wheezes, no crackles, no rhonchi.  Normal respiratory effort.  Cardiovascular system: Regular rate rhythm no murmurs rubs or gallops.  No JVD.  Trace to 1+ bilateral lower extremity edema. Gastrointestinal system: Abdomen is soft, nontender, nondistended, positive bowel sounds.  No rebound.  No guarding.  Central nervous system: Alert and oriented. No focal neurological deficits. Extremities: Symmetric 5  x 5 power. Skin: No rashes, lesions or ulcers Psychiatry: Judgement and insight appear normal. Mood & affect appropriate.     Data Reviewed: I have personally reviewed following labs and imaging studies  CBC: Recent Labs  Lab 08/18/20 0220 08/19/20 0240 08/20/20 0244  WBC 13.2* 9.8 7.4  NEUTROABS 11.9* 6.4  --   HGB 14.0 11.9* 12.0*  HCT 41.0 35.4* 35.3*  MCV 92.1 92.9 93.1  PLT 224 190 810    Basic Metabolic Panel: Recent Labs  Lab 08/18/20 1529 08/18/20 2020 08/19/20 0240 08/19/20 0940 08/20/20 0244  NA 139 136 137 140 135  K 3.6 3.7 3.9 3.6 3.8  CL 102 102 99 102 100  CO2  25 23 22 25 24   GLUCOSE 246* 293* 296* 274* 235*  BUN 17 19 17 18 18   CREATININE 0.73 1.12 0.85 0.69 0.75  CALCIUM 9.0 8.8* 9.2 9.2 9.1  MG  --   --  2.0  --  2.1  PHOS  --   --  3.2  --   --     GFR: Estimated Creatinine Clearance: 153.9 mL/min (by C-G formula based on SCr of 0.75 mg/dL).  Liver Function Tests: Recent Labs  Lab 08/18/20 0824 08/19/20 0240  AST 20 21  ALT 19 19  ALKPHOS 50 46  BILITOT 0.8 0.6  PROT 6.7 6.6  ALBUMIN 3.6 3.6    CBG: Recent Labs  Lab 08/19/20 0738 08/19/20 1156 08/19/20 1607 08/19/20 2115 08/20/20 0802  GLUCAP 266* 270* 231* 291* 190*     Recent Results (from the past 240 hour(s))  Respiratory Panel by RT PCR (Flu A&B, Covid) - Nasopharyngeal Swab     Status: None   Collection Time: 08/18/20  2:20 AM   Specimen: Nasopharyngeal Swab  Result Value Ref Range Status   SARS Coronavirus 2 by RT PCR NEGATIVE NEGATIVE Final    Comment: (NOTE) SARS-CoV-2 target nucleic acids are NOT DETECTED.  The SARS-CoV-2 RNA is generally detectable in upper respiratoy specimens during the acute phase of infection. The lowest concentration of SARS-CoV-2 viral copies this assay can detect is 131 copies/mL. A negative result does not preclude SARS-Cov-2 infection and should not be used as the sole basis for treatment or other patient management decisions. A  negative result may occur with  improper specimen collection/handling, submission of specimen other than nasopharyngeal swab, presence of viral mutation(s) within the areas targeted by this assay, and inadequate number of viral copies (<131 copies/mL). A negative result must be combined with clinical observations, patient history, and epidemiological information. The expected result is Negative.  Fact Sheet for Patients:  PinkCheek.be  Fact Sheet for Healthcare Providers:  GravelBags.it  This test is no t yet approved or cleared by the [place] FDA and  has been authorized for detection and/or diagnosis of SARS-CoV-2 by FDA under an Emergency Use Authorization (EUA). This EUA will remain  in effect (meaning this test can be used) for the duration of the COVID-19 declaration under Section 564(b)(1) of the Act, 21 U.S.C. section 360bbb-3(b)(1), unless the authorization is terminated or revoked sooner.     Influenza A by PCR NEGATIVE NEGATIVE Final   Influenza B by PCR NEGATIVE NEGATIVE Final    Comment: (NOTE) The Xpert Xpress SARS-CoV-2/FLU/RSV assay is intended as an aid in  the diagnosis of influenza from Nasopharyngeal swab specimens and  should not be used as a sole basis for treatment. Nasal washings and  aspirates are unacceptable for Xpert Xpress SARS-CoV-2/FLU/RSV  testing.  Fact Sheet for Patients: PinkCheek.be  Fact Sheet for Healthcare Providers: GravelBags.it  This test is not yet approved or cleared by the [place] FDA and  has been authorized for detection and/or diagnosis of SARS-CoV-2 by  FDA under an Emergency Use Authorization (EUA). This EUA will remain  in effect (meaning this test can be used) for the duration of the  Covid-19 declaration under Section 564(b)(1) of the Act, 21  U.S.C. section 360bbb-3(b)(1), unless the authorization  is  terminated or revoked. Performed at [facility], [place] [address], [place], [place] [address]   MRSA PCR Screening     Status: None   Collection Time: 08/18/20  4:53 AM  Specimen: Nasopharyngeal  Result Value Ref Range Status   MRSA by PCR NEGATIVE NEGATIVE Final    Comment:        The GeneXpert MRSA Assay (FDA approved for NASAL specimens only), is one component of a comprehensive MRSA colonization surveillance program. It is not intended to diagnose MRSA infection nor to guide or monitor treatment for MRSA infections. Performed at [facility], [place] [address] [place], [place] [address]          Radiology Studies: No results found.      Scheduled Meds: . allopurinol  100 mg Oral Daily  . aspirin  81 mg Oral Daily  . atorvastatin  40 mg Oral Daily  . carvedilol  25 mg Oral BID WC  . Chlorhexidine Gluconate Cloth  6 each Topical Daily  . DULoxetine  60 mg Oral Daily  . enoxaparin (LOVENOX) injection  80 mg Subcutaneous Q24H  . famotidine  20 mg Oral QHS  . fenofibrate  160 mg Oral Daily  . furosemide  40 mg Oral TID  . insulin aspart  0-20 Units Subcutaneous TID WC  . insulin aspart  0-5 Units Subcutaneous QHS  . insulin regular human CONCENTRATED  45 Units Subcutaneous TID WC  . isosorbide mononitrate  30 mg Oral Daily  . lisinopril  5 mg Oral Daily  . pantoprazole  40 mg Oral BID  . potassium chloride  20 mEq Oral Daily  . pregabalin  300 mg Oral BID  . traZODone  50 mg Oral QHS   Continuous Infusions:    LOS: 2 days    Time spent: 35 minutes    Irine Seal, MD [facility]   To contact the attending provider between 7A-7P or the covering provider during after hours 7P-7A, please log into the web site [email or web address] and access using universal [place] password for that web site. If you do not have the password, please call the hospital operator.  08/20/2020, 9:25 AM

## 2020-08-20 NOTE — Progress Notes (Signed)
Patient BP elevated throughout shift, hydralazine lowered BP to 159/69, but BP elevated back to 179/93 an hour after administering.   Floor coverage paged, will continue to monitor

## 2020-08-20 NOTE — Progress Notes (Signed)
Patient continues to decline the use of nocturnal CPAP. Order changed to prn

## 2020-08-21 DIAGNOSIS — E1165 Type 2 diabetes mellitus with hyperglycemia: Secondary | ICD-10-CM

## 2020-08-21 LAB — BASIC METABOLIC PANEL
Anion gap: 11 (ref 5–15)
BUN: 15 mg/dL (ref 6–20)
CO2: 27 mmol/L (ref 22–32)
Calcium: 8.9 mg/dL (ref 8.9–10.3)
Chloride: 99 mmol/L (ref 98–111)
Creatinine, Ser: 0.71 mg/dL (ref 0.61–1.24)
GFR, Estimated: >60 mL/min (ref >60)
Glucose, Bld: 182 mg/dL — ABNORMAL HIGH (ref 70–99)
Potassium: 3.2 mmol/L — ABNORMAL LOW (ref 3.5–5.1)
Sodium: 137 mmol/L (ref 135–145)

## 2020-08-21 LAB — GLUCOSE, CAPILLARY
Glucose-Capillary: 201 mg/dL — ABNORMAL HIGH (ref 70–99)
Glucose-Capillary: 270 mg/dL — ABNORMAL HIGH (ref 70–99)

## 2020-08-21 LAB — MAGNESIUM: Magnesium: 2.1 mg/dL (ref 1.7–2.4)

## 2020-08-21 MED ORDER — INSULIN REGULAR HUMAN (CONC) 500 UNIT/ML ~~LOC~~ SOPN
50.0000 [IU] | PEN_INJECTOR | Freq: Three times a day (TID) | SUBCUTANEOUS | Status: DC
  Administered 2020-08-21 (×2): 50 [IU] via SUBCUTANEOUS
  Filled 2020-08-21: qty 3

## 2020-08-21 MED ORDER — LISINOPRIL 10 MG PO TABS
10.0000 mg | ORAL_TABLET | Freq: Every day | ORAL | 1 refills | Status: DC
Start: 2020-08-21 — End: 2020-10-17

## 2020-08-21 MED ORDER — POTASSIUM CHLORIDE CRYS ER 20 MEQ PO TBCR
40.0000 meq | EXTENDED_RELEASE_TABLET | ORAL | Status: AC
  Administered 2020-08-21 (×2): 40 meq via ORAL
  Filled 2020-08-21 (×2): qty 2

## 2020-08-21 MED ORDER — POTASSIUM CHLORIDE CRYS ER 20 MEQ PO TBCR: 20.0000 meq | EXTENDED_RELEASE_TABLET | Freq: Every day | ORAL | Status: DC

## 2020-08-21 NOTE — Discharge Summary (Signed)
Physician Discharge Summary  [name] GGY:[id number] DOB: [birth year] DOA: 08/18/2020  PCP: [name], FNP  Admit date: 08/18/2020 Discharge date: 08/21/2020  Time spent: 55 minutes  Recommendations for Outpatient Follow-up:  1. Follow-up with [name], FNP in 1 to 2 weeks.  On follow-up patient will need a basic metabolic profile done to follow-up on electrolytes and renal function.  Patient's blood pressure need to be reassessed as patient's ACE inhibitor dose was increased to 10 mg daily. 2. Follow-up with Dr. Peri Jefferson, endocrinology as previously scheduled 09/04/2020.   Discharge Diagnoses:  Principal Problem:   DKA (diabetic ketoacidosis) ([place]) Active Problems:   HTN (hypertension)   Type 2 diabetes mellitus with vascular disease ([place])   AKI (acute kidney injury) ([place])   Chronic diastolic CHF (congestive heart failure) ([facility])   Hypertensive urgency   OSA (obstructive sleep apnea)   Discharge Condition: Stable and improved  Diet recommendation: Heart healthy/carb modified  Filed Weights   08/18/20 0035  Weight: (!) 154.3 kg    History of present illness:  HPI per Dr. Jacklynn Stein is a 59 y.o. male with medical history significant of DM2 poorly controlled, CAD s/p stent, dCHF, HTN, chronic back pain.  Pt had IM steroid injection on day of admission for back pain.  Looks like he is scheduled for epidural injection in Nov.  Had steroid injection at around 330pm.  Checked BGL after that was 461.  Had headache when he got home.  Ate, rechecked BGL 2h later around 10pm and it was 597.  Headache resolved and patient actually feels fine.  Did have episode of vomiting after steroid injection but no N/V now.  But presents to ED for elevated BGL.  No CP, SOB at this time (though has been having these chronically for a while he says).  Has polyuria and thirst.  Symptoms are mild.  Nothing makes symptoms better or worse.  Symptoms are  persistent.   ED Course: Pt with dehydration and DKA.  AG 22.  PH 7.29, PCO2 15.4, bicarb 7.2 on the VBG.  Bicarb is 17 though on BMP.  Not sure why the discrepancy.  Lactate 5.2, BHB 1.1.  Getting multiple L bolus and insulin gtt now.  Hospital Course:  1 DKA Likely secondary to epidural steroid shot given prior to admission.  Patient admitted after receiving epidural steroid shot noted to have elevated blood glucose levels on presentation of 035 on basic metabolic profile.  Venous blood gas with a PCO2 of 15, PO2 of 57, pH of 7.291, bicarb of 7.2.  Patient noted to have an anion gap of 22.  Lactic acid was initially elevated at 5.2 but trended down.  Beta hydroxybutyrate elevated at 1.06.  Hemoglobin A1c 10.6 (08/18/2020).  Patient noted to be afebrile.  Leukocytosis trended down and resolved.  Urinalysis colorless, nitrite negative, leukocytes negative, 20 ketones, greater than 500 glucose.  Patient with no signs or symptoms of infection.  Patient denies any chest pain.  Patient was placed on the insulin drip and subsequently transitioned to subcutaneous insulin after anion gap closed and blood glucose levels have improved.  Patient placed on a carb modified diet which he tolerated.  Initially anion gap was rising at 16 on 08/19/2020 however since resolved and gap closed.  IV fluids were subsequently saline lock.  Patient transitioned to Lantus initially and dose increased to 120 units daily.  Patient also placed on sliding scale insulin.  Patient was subsequently transition back  to U500 and started at 45 units 3 times daily which was increased to 50 units 3 times daily in addition to sliding scale insulin.  Blood glucose levels improved.  Patient be discharged home in stable and improved condition.  Outpatient follow-up with endocrinologist, Dr. Peri Jefferson as previously scheduled on September 04, 2020.    2.  Acute kidney injury Felt secondary to prerenal azotemia secondary to dehydration  associated with DKA.  Renal function improved with hydration.  Patient's ACE inhibitor and diuretics initially held.  ACE inhibitor and diuretic subsequently resumed.  Renal function remained stable.  Outpatient follow-up with PCP.  3.  Hypertension/hypertensive urgency. Patient noted to have systolic blood pressures in the 170s to 200s on 08/20/2020.    Patient was maintained on home regimen Coreg and Imdur.  Patient's ACE inhibitor and Lasix initially held during the hospitalization and subsequently resumed.  Patient's home dose ACE inhibitor was increased to 10 mg daily for better blood pressure control.  Hypertensive urgency improved and had resolved by day of discharge.  Outpatient follow-up with PCP.   4.  Chronic diastolic CHF/coronary artery disease status post stent Stable.  Patient with no signs of significant volume overload during the hospitalization.  Patient initially hydrated with IV fluids secondary to DKA.  Patient was maintained on home regimen Coreg, Lipitor, aspirin and Imdur.  ACE inhibitor and diuretics initially held and subsequently resumed.  Patient remained in stable condition.  Outpatient follow-up with cardiology.  5.  Morbid obesity  6.  Hyperlipidemia Patient maintained on home regimen statin.   7.  OSA CPAP nightly.  8.  Hypokalemia Secondary to diuretics.  Repleted on day of discharge.  Outpatient follow-up with PCP.   Procedures:  Chest x-ray 08/22/2019  Consultations:  None  Discharge Exam: Vitals:   08/21/20 0401 08/21/20 0750  BP:  (!) 152/74  Pulse:  79  Resp:  19  Temp: 98.3 F (36.8 C)   SpO2:  92%    General: NAD Cardiovascular: RRR Respiratory: CTAB  Discharge Instructions   Discharge Instructions    Diet - low sodium heart healthy   Complete by: As directed    Diet Carb Modified   Complete by: As directed    Increase activity slowly   Complete by: As directed      Allergies as of 08/21/2020      Reactions    Coconut Flavor [flavoring Agent] Hives   Coconut Oil Hives   Ibuprofen Other (See Comments)   Made gastric ulcers worse   Aleve [naproxen] Other (See Comments)   DUE TO KIDNEYS   Nsaids Other (See Comments)   Stomach ulcers   Vascepa [icosapent Ethyl] Other (See Comments)   headaches, chest pain, similar to sx of a stroke, hypotension       Medication List    TAKE these medications   Accu-Chek FastClix Lancets Misc 1 Device by Other route 2 (two) times daily.   Albuterol Sulfate 108 (90 Base) MCG/ACT Aepb Commonly known as: ProAir RespiClick Inhale 2 puffs into the lungs every 6 (six) hours as needed. What changed: reasons to take this   allopurinol 100 MG tablet Commonly known as: ZYLOPRIM Take 100 mg by mouth daily.   aspirin 81 MG chewable tablet Chew 1 tablet (81 mg total) by mouth daily.   atorvastatin 40 MG tablet Commonly known as: LIPITOR Take 1 tablet (40 mg total) by mouth daily.   carvedilol 25 MG tablet Commonly known as: COREG Take 1 tablet (25  mg total) by mouth 2 (two) times daily with a meal.   [employer] VITAMIN B12 1000 MCG tablet Generic drug: cyanocobalamin Take 1,000 mcg by mouth daily.   diclofenac 1.3 % Ptch Commonly known as: FLECTOR Place 1 patch onto the skin daily as needed for pain.   DULoxetine 60 MG capsule Commonly known as: CYMBALTA Take 1 capsule (60 mg total) by mouth daily. Please request future refills from PCP.   famotidine 20 MG tablet Commonly known as: PEPCID Take 20 mg by mouth daily.   fenofibrate 145 MG tablet Commonly known as: TRICOR Take 1 tablet (145 mg total) by mouth daily.   furosemide 40 MG tablet Commonly known as: Lasix Take 1 tablet (40 mg total) by mouth 3 (three) times daily.   glipiZIDE 10 MG tablet Commonly known as: GLUCOTROL Take 10 mg by mouth 2 (two) times daily.   HumuLIN R U-500 KwikPen 500 UNIT/ML kwikpen Generic drug: insulin regular human CONCENTRATED Inject 0-85 Units into the skin in the  morning, at noon, and at bedtime. Per sliding scale-   Insulin Pen Needle 31G X 5 MM Misc Use 1 needle daily to inject insulin as prescribed What changed:   how much to take  how to take this  when to take this   isosorbide mononitrate 30 MG 24 hr tablet Commonly known as: IMDUR Take 1 tablet (30 mg total) by mouth daily.   lisinopril 10 MG tablet Commonly known as: ZESTRIL Take 1 tablet (10 mg total) by mouth daily. What changed:   medication strength  how much to take   nitroGLYCERIN 0.4 MG SL tablet Commonly known as: NITROSTAT Place 1 tablet (0.4 mg total) under the tongue every 5 (five) minutes as needed for chest pain.   ondansetron 4 MG tablet Commonly known as: ZOFRAN Take 4 mg by mouth 2 (two) times daily as needed for nausea/vomiting.   oxyCODONE 15 MG immediate release tablet Commonly known as: ROXICODONE Take 15 mg 3 (three) times daily as needed by mouth for pain.   pantoprazole 40 MG tablet Commonly known as: PROTONIX Take 1 tablet (40 mg total) by mouth 2 (two) times daily.   potassium chloride 10 MEQ CR capsule Commonly known as: MICRO-K Take 10 mEq by mouth in the morning and at bedtime.   pregabalin 300 MG capsule Commonly known as: LYRICA Take 300 mg by mouth 2 (two) times daily.   tiZANidine 4 MG tablet Commonly known as: ZANAFLEX Take 4 mg by mouth 3 (three) times daily as needed for muscle spasms.   traZODone 50 MG tablet Commonly known as: DESYREL Take 1 tablet (50 mg total) by mouth at bedtime.   Vitamin D (Ergocalciferol) 1.25 MG (50000 UNIT) Caps capsule Commonly known as: DRISDOL Take 50,000 Units by mouth once a week. mondays      Allergies  Allergen Reactions  . Coconut Flavor [Flavoring Agent] Hives  . Coconut Oil Hives  . Ibuprofen Other (See Comments)    Made gastric ulcers worse  . Aleve [Naproxen] Other (See Comments)    DUE TO KIDNEYS  . Nsaids Other (See Comments)    Stomach ulcers   . Vascepa [Icosapent  Ethyl] Other (See Comments)    headaches, chest pain, similar to sx of a stroke, hypotension     Follow-up Information    [name], FNP. Schedule an appointment as soon as possible for a visit in 1 week(s).   Specialty: Family Medicine Why: f/u in 1-2 weeks. Contact information:  [place] [address] [phone]        Lelon Perla, MD .   Specialty: Cardiology Contact information: [address] [place] [address] [phone]        Riccardo Dubin, PA-C Follow up on 09/04/2020.   Specialty: Internal Medicine Why: Follow-up as scheduled. Contact information: [address] [place] [address] [phone]                The results of significant diagnostics from this hospitalization (including imaging, microbiology, ancillary and laboratory) are listed below for reference.    Significant Diagnostic Studies: No results found.  Microbiology: Recent Results (from the past 240 hour(s))  Respiratory Panel by RT PCR (Flu A&B, Covid) - Nasopharyngeal Swab     Status: None   Collection Time: 08/18/20  2:20 AM   Specimen: Nasopharyngeal Swab  Result Value Ref Range Status   SARS Coronavirus 2 by RT PCR NEGATIVE NEGATIVE Final    Comment: (NOTE) SARS-CoV-2 target nucleic acids are NOT DETECTED.  The SARS-CoV-2 RNA is generally detectable in upper respiratoy specimens during the acute phase of infection. The lowest concentration of SARS-CoV-2 viral copies this assay can detect is 131 copies/mL. A negative result does not preclude SARS-Cov-2 infection and should not be used as the sole basis for treatment or other patient management decisions. A negative result may occur with  improper specimen collection/handling, submission of specimen other than nasopharyngeal swab, presence of viral mutation(s) within the areas targeted by this assay, and inadequate number of viral copies (<131 copies/mL). A  negative result must be combined with clinical observations, patient history, and epidemiological information. The expected result is Negative.  Fact Sheet for Patients:  PinkCheek.be  Fact Sheet for Healthcare Providers:  GravelBags.it  This test is no t yet approved or cleared by the [place] FDA and  has been authorized for detection and/or diagnosis of SARS-CoV-2 by FDA under an Emergency Use Authorization (EUA). This EUA will remain  in effect (meaning this test can be used) for the duration of the COVID-19 declaration under Section 564(b)(1) of the Act, 21 U.S.C. section 360bbb-3(b)(1), unless the authorization is terminated or revoked sooner.     Influenza A by PCR NEGATIVE NEGATIVE Final   Influenza B by PCR NEGATIVE NEGATIVE Final    Comment: (NOTE) The Xpert Xpress SARS-CoV-2/FLU/RSV assay is intended as an aid in  the diagnosis of influenza from Nasopharyngeal swab specimens and  should not be used as a sole basis for treatment. Nasal washings and  aspirates are unacceptable for Xpert Xpress SARS-CoV-2/FLU/RSV  testing.  Fact Sheet for Patients: PinkCheek.be  Fact Sheet for Healthcare Providers: GravelBags.it  This test is not yet approved or cleared by the [place] FDA and  has been authorized for detection and/or diagnosis of SARS-CoV-2 by  FDA under an Emergency Use Authorization (EUA). This EUA will remain  in effect (meaning this test can be used) for the duration of the  Covid-19 declaration under Section 564(b)(1) of the Act, 21  U.S.C. section 360bbb-3(b)(1), unless the authorization is  terminated or revoked. Performed at [facility], [place] [address], [place], [place] [address]   MRSA PCR Screening     Status: None   Collection Time: 08/18/20  4:53 AM   Specimen: Nasopharyngeal  Result Value Ref Range Status    MRSA by PCR NEGATIVE NEGATIVE Final    Comment:  The GeneXpert MRSA Assay (FDA approved for NASAL specimens only), is one component of a comprehensive MRSA colonization surveillance program. It is not intended to diagnose MRSA infection nor to guide or monitor treatment for MRSA infections. Performed at [facility], [place] [address] [place], [place] [address]      Labs: Basic Metabolic Panel: Recent Labs  Lab 08/18/20 2020 08/19/20 0240 08/19/20 0940 08/20/20 0244 08/21/20 0248  NA 136 137 140 135 137  K 3.7 3.9 3.6 3.8 3.2*  CL 102 99 102 100 99  CO2 23 22 25 24 27   GLUCOSE 293* 296* 274* 235* 182*  BUN 19 17 18 18 15   CREATININE 1.12 0.85 0.69 0.75 0.71  CALCIUM 8.8* 9.2 9.2 9.1 8.9  MG  --  2.0  --  2.1 2.1  PHOS  --  3.2  --   --   --    Liver Function Tests: Recent Labs  Lab 08/18/20 0824 08/19/20 0240  AST 20 21  ALT 19 19  ALKPHOS 50 46  BILITOT 0.8 0.6  PROT 6.7 6.6  ALBUMIN 3.6 3.6   No results for input(s): LIPASE, AMYLASE in the last 168 hours. No results for input(s): AMMONIA in the last 168 hours. CBC: Recent Labs  Lab 08/18/20 0220 08/19/20 0240 08/20/20 0244  WBC 13.2* 9.8 7.4  NEUTROABS 11.9* 6.4  --   HGB 14.0 11.9* 12.0*  HCT 41.0 35.4* 35.3*  MCV 92.1 92.9 93.1  PLT 224 190 171   Cardiac Enzymes: No results for input(s): CKTOTAL, CKMB, CKMBINDEX, TROPONINI in the last 168 hours. BNP: BNP (last 3 results) Recent Labs    08/22/19 2345  BNP 58.1    ProBNP (last 3 results) No results for input(s): PROBNP in the last 8760 hours.  CBG: Recent Labs  Lab 08/20/20 0802 08/20/20 1313 08/20/20 1656 08/20/20 2200 08/21/20 0749  GLUCAP 190* 244* 188* 232* 201*       Signed:  Irine Seal MD.  [facility] 08/21/2020, 9:47 AM

## 2020-08-24 NOTE — Therapy (Signed)
[facility] [address] [place], [place], [address] Phone: [phone]   Fax:  [phone]  Patient Details  Name: [name] MRN: [id number] Date of Birth: 1961-03-24 Referring Provider:  [name], FNP  Encounter Date: 08/17/2020   OT Short Term Goals - 08/17/20 1218      OT SHORT TERM GOAL #1   Title Pt will be independent with HEP 08/03/2020    Time 4    Period Weeks    Status Achieved    Target Date 08/03/20      OT SHORT TERM GOAL #2   Title Pt will improve grip strength by 5 lbs in LUE in order to increase independence and ability to complete ADLs and IADLs    Baseline LUE 73.8 lbs    Time 4    Period Weeks    Status Achieved   80.6 grip strength in LUE 0/10 pain. 10/11     OT SHORT TERM GOAL #3   Title Pt will improve functional use of LUE as evidenced by performing 3 button test in 45 seconds or less.    Time 4    Period Weeks    Status Achieved   10/11 - 35.78 sec     OT SHORT TERM GOAL #4   Title Patient will report pain no more than 5/10 with functional use of LUE    Time 4    Period Weeks    Status Achieved   heat has helped LUE and pt reports no pain with functional tasks and in therapy sessions. 07/23/20     OT SHORT TERM GOAL #5   Title Pt will improve lateral pinch strength by 3 lbs in order to increase ability to manage fasteners with clothing management.    Baseline LUE 17 lbs    Time 4    Period Weeks    Status Deferred   19lbs 10/6. limiting grip with LUE thumb           OT Long Term Goals - 08/17/20 1205      OT LONG TERM GOAL #1   Title Pt will be indepedent with updated HEP 08/31/20    Time 6    Period Weeks    Status Achieved      OT LONG TERM GOAL #2   Title Pt will improve grip strength by 10 lbs in LUE in order to increase independence and ability to complete ADLs and IADLs    Time 6    Period Weeks    Status Deferred   limiting grip with LUE secondary to  trigger thumb     OT LONG TERM GOAL #3   Title Pt will improve functional use of LUE as evidenced by performing 3 button test in 35 seconds or less.    Time 6    Period Weeks    Status Achieved   29 seconds     OT LONG TERM GOAL #4   Title Patient will report pain no more than 3/10 with functional use of LUE    Time 6    Period Weeks    Status Not Met   pt remains at a 4/10 at rest in LUE.     OT LONG TERM GOAL #5   Title Patient will report understanding and compliance of orthosis/splint PRN and/or verbalize understanding of proper body mechanics for wrist neutralization.    Time 6    Period Weeks    Status Achieved  OT LONG TERM GOAL #6   Title Pt will improve lateral pinch strength by 6 lbs in order to increase ability to manage fasteners with clothing management.    Baseline LUE 17 lbs    Time 6    Period Weeks    Status Deferred   pt to limit gripping with LUE thumb.          Zachery Conch MOT, OTR/L  08/24/2020, 10:28 AM  [facility] [address] [place], [place], [address] Phone: [phone]   Fax:  [phone]

## 2020-08-24 NOTE — Therapy (Signed)
[facility] [address] [place], [place], [address] Phone: [phone]   Fax:  [phone]  Patient Details  Name: [name] MRN: [id number] Date of Birth: [birth year] Referring Provider:  [name], FNP  Encounter Date: 08/17/2020   Pt was unable to attend last scheduled visit and was discharged over the phone. Pt did not have any additional questions and was in agreement to discharge.    OT Short Term Goals - 08/17/20 1218      OT SHORT TERM GOAL #1   Title Pt will be independent with HEP 08/03/2020    Time 4    Period Weeks    Status Achieved    Target Date 08/03/20      OT SHORT TERM GOAL #2   Title Pt will improve grip strength by 5 lbs in LUE in order to increase independence and ability to complete ADLs and IADLs    Baseline LUE 73.8 lbs    Time 4    Period Weeks    Status Achieved   80.6 grip strength in LUE 0/10 pain. 10/11     OT SHORT TERM GOAL #3   Title Pt will improve functional use of LUE as evidenced by performing 3 button test in 45 seconds or less.    Time 4    Period Weeks    Status Achieved   10/11 - 35.78 sec     OT SHORT TERM GOAL #4   Title Patient will report pain no more than 5/10 with functional use of LUE    Time 4    Period Weeks    Status Achieved   heat has helped LUE and pt reports no pain with functional tasks and in therapy sessions. 07/23/20     OT SHORT TERM GOAL #5   Title Pt will improve lateral pinch strength by 3 lbs in order to increase ability to manage fasteners with clothing management.    Baseline LUE 17 lbs    Time 4    Period Weeks    Status Deferred   19lbs 10/6. limiting grip with LUE thumb           OT Long Term Goals - 08/17/20 1205      OT LONG TERM GOAL #1   Title Pt will be indepedent with updated HEP 08/31/20    Time 6    Period Weeks    Status Achieved      OT LONG TERM GOAL #2   Title Pt will improve grip strength by 10 lbs in  LUE in order to increase independence and ability to complete ADLs and IADLs    Time 6    Period Weeks    Status Deferred   limiting grip with LUE secondary to trigger thumb     OT LONG TERM GOAL #3   Title Pt will improve functional use of LUE as evidenced by performing 3 button test in 35 seconds or less.    Time 6    Period Weeks    Status Achieved   29 seconds     OT LONG TERM GOAL #4   Title Patient will report pain no more than 3/10 with functional use of LUE    Time 6    Period Weeks    Status Not Met   pt remains at a 4/10 at rest in LUE.     OT LONG TERM GOAL #5   Title Patient will report understanding and  compliance of orthosis/splint PRN and/or verbalize understanding of proper body mechanics for wrist neutralization.    Time 6    Period Weeks    Status Achieved      OT LONG TERM GOAL #6   Title Pt will improve lateral pinch strength by 6 lbs in order to increase ability to manage fasteners with clothing management.    Baseline LUE 17 lbs    Time 6    Period Weeks    Status Deferred   pt to limit gripping with LUE thumb.         OCCUPATIONAL THERAPY DISCHARGE SUMMARY  Visits from Start of Care: 10  Current functional level related to goals / functional outcomes: Independent    Remaining deficits: Limited grip secondary to BUE hand injuries    Education / Equipment: HEP  Plan: Patient agrees to discharge.  Patient goals were partially met. Patient is being discharged due to being pleased with the current functional level.  ?????        Zachery Conch  MOT, OTR/L  08/24/2020, 10:29 AM  [facility] [address] [place], [place], [address] Phone: [phone]   Fax:  [phone]

## 2020-10-15 ENCOUNTER — Inpatient Hospital Stay (HOSPITAL_COMMUNITY): Payer: Medicaid Other

## 2020-10-15 ENCOUNTER — Encounter (HOSPITAL_COMMUNITY): Payer: Self-pay

## 2020-10-15 ENCOUNTER — Emergency Department (HOSPITAL_COMMUNITY): Payer: Medicaid Other

## 2020-10-15 ENCOUNTER — Inpatient Hospital Stay (HOSPITAL_COMMUNITY)
Admission: EM | Admit: 2020-10-15 | Discharge: 2020-10-17 | DRG: 683 | Disposition: A | Discharge status: Home / Self Care | Payer: Medicaid Other | Attending: Internal Medicine | Admitting: Internal Medicine

## 2020-10-15 ENCOUNTER — Other Ambulatory Visit: Payer: Self-pay

## 2020-10-15 DIAGNOSIS — Z79899 Other long term (current) drug therapy: Secondary | ICD-10-CM

## 2020-10-15 DIAGNOSIS — E1142 Type 2 diabetes mellitus with diabetic polyneuropathy: Secondary | ICD-10-CM | POA: Diagnosis present

## 2020-10-15 DIAGNOSIS — R000 Tachycardia, unspecified: Secondary | ICD-10-CM | POA: Diagnosis not present

## 2020-10-15 DIAGNOSIS — Z91018 Allergy to other foods: Secondary | ICD-10-CM

## 2020-10-15 DIAGNOSIS — I110 Hypertensive heart disease with heart failure: Secondary | ICD-10-CM | POA: Diagnosis present

## 2020-10-15 DIAGNOSIS — K589 Irritable bowel syndrome without diarrhea: Secondary | ICD-10-CM | POA: Diagnosis present

## 2020-10-15 DIAGNOSIS — I2510 Atherosclerotic heart disease of native coronary artery without angina pectoris: Secondary | ICD-10-CM | POA: Diagnosis present

## 2020-10-15 DIAGNOSIS — Z86718 Personal history of other venous thrombosis and embolism: Secondary | ICD-10-CM

## 2020-10-15 DIAGNOSIS — Z7982 Long term (current) use of aspirin: Secondary | ICD-10-CM

## 2020-10-15 DIAGNOSIS — S022XXA Fracture of nasal bones, initial encounter for closed fracture: Secondary | ICD-10-CM | POA: Diagnosis present

## 2020-10-15 DIAGNOSIS — Z955 Presence of coronary angioplasty implant and graft: Secondary | ICD-10-CM

## 2020-10-15 DIAGNOSIS — Z6841 Body Mass Index (BMI) 40.0 and over, adult: Secondary | ICD-10-CM | POA: Diagnosis not present

## 2020-10-15 DIAGNOSIS — Z86711 Personal history of pulmonary embolism: Secondary | ICD-10-CM

## 2020-10-15 DIAGNOSIS — F32A Depression, unspecified: Secondary | ICD-10-CM | POA: Diagnosis present

## 2020-10-15 DIAGNOSIS — E1169 Type 2 diabetes mellitus with other specified complication: Secondary | ICD-10-CM | POA: Diagnosis present

## 2020-10-15 DIAGNOSIS — G894 Chronic pain syndrome: Secondary | ICD-10-CM | POA: Diagnosis present

## 2020-10-15 DIAGNOSIS — R55 Syncope and collapse: Secondary | ICD-10-CM | POA: Diagnosis present

## 2020-10-15 DIAGNOSIS — E782 Mixed hyperlipidemia: Secondary | ICD-10-CM | POA: Diagnosis present

## 2020-10-15 DIAGNOSIS — E1151 Type 2 diabetes mellitus with diabetic peripheral angiopathy without gangrene: Secondary | ICD-10-CM | POA: Diagnosis present

## 2020-10-15 DIAGNOSIS — Z8673 Personal history of transient ischemic attack (TIA), and cerebral infarction without residual deficits: Secondary | ICD-10-CM

## 2020-10-15 DIAGNOSIS — I5032 Chronic diastolic (congestive) heart failure: Secondary | ICD-10-CM | POA: Diagnosis present

## 2020-10-15 DIAGNOSIS — G4733 Obstructive sleep apnea (adult) (pediatric): Secondary | ICD-10-CM | POA: Diagnosis present

## 2020-10-15 DIAGNOSIS — K219 Gastro-esophageal reflux disease without esophagitis: Secondary | ICD-10-CM | POA: Diagnosis present

## 2020-10-15 DIAGNOSIS — Z95828 Presence of other vascular implants and grafts: Secondary | ICD-10-CM

## 2020-10-15 DIAGNOSIS — E1165 Type 2 diabetes mellitus with hyperglycemia: Secondary | ICD-10-CM | POA: Diagnosis present

## 2020-10-15 DIAGNOSIS — Z20822 Contact with and (suspected) exposure to covid-19: Secondary | ICD-10-CM | POA: Diagnosis present

## 2020-10-15 DIAGNOSIS — Z8249 Family history of ischemic heart disease and other diseases of the circulatory system: Secondary | ICD-10-CM

## 2020-10-15 DIAGNOSIS — Z9102 Food additives allergy status: Secondary | ICD-10-CM

## 2020-10-15 DIAGNOSIS — E861 Hypovolemia: Secondary | ICD-10-CM | POA: Diagnosis present

## 2020-10-15 DIAGNOSIS — Z713 Dietary counseling and surveillance: Secondary | ICD-10-CM

## 2020-10-15 DIAGNOSIS — Z794 Long term (current) use of insulin: Secondary | ICD-10-CM

## 2020-10-15 DIAGNOSIS — W06XXXA Fall from bed, initial encounter: Secondary | ICD-10-CM | POA: Diagnosis present

## 2020-10-15 DIAGNOSIS — I959 Hypotension, unspecified: Secondary | ICD-10-CM

## 2020-10-15 DIAGNOSIS — I714 Abdominal aortic aneurysm, without rupture: Secondary | ICD-10-CM | POA: Diagnosis present

## 2020-10-15 DIAGNOSIS — Z888 Allergy status to other drugs, medicaments and biological substances status: Secondary | ICD-10-CM

## 2020-10-15 DIAGNOSIS — K649 Unspecified hemorrhoids: Secondary | ICD-10-CM | POA: Diagnosis present

## 2020-10-15 DIAGNOSIS — K760 Fatty (change of) liver, not elsewhere classified: Secondary | ICD-10-CM | POA: Diagnosis present

## 2020-10-15 DIAGNOSIS — N179 Acute kidney failure, unspecified: Principal | ICD-10-CM | POA: Diagnosis present

## 2020-10-15 DIAGNOSIS — I9589 Other hypotension: Secondary | ICD-10-CM | POA: Diagnosis present

## 2020-10-15 DIAGNOSIS — Z886 Allergy status to analgesic agent status: Secondary | ICD-10-CM

## 2020-10-15 DIAGNOSIS — Z7984 Long term (current) use of oral hypoglycemic drugs: Secondary | ICD-10-CM

## 2020-10-15 DIAGNOSIS — Z79891 Long term (current) use of opiate analgesic: Secondary | ICD-10-CM

## 2020-10-15 DIAGNOSIS — S022XXB Fracture of nasal bones, initial encounter for open fracture: Secondary | ICD-10-CM

## 2020-10-15 DIAGNOSIS — Z833 Family history of diabetes mellitus: Secondary | ICD-10-CM

## 2020-10-15 DIAGNOSIS — S0121XA Laceration without foreign body of nose, initial encounter: Secondary | ICD-10-CM | POA: Diagnosis present

## 2020-10-15 LAB — CBC
HCT: 36.1 % — ABNORMAL LOW (ref 39.0–52.0)
Hemoglobin: 12 g/dL — ABNORMAL LOW (ref 13.0–17.0)
MCH: 30.9 pg (ref 26.0–34.0)
MCHC: 33.2 g/dL (ref 30.0–36.0)
MCV: 93 fL (ref 80.0–100.0)
Platelets: 232 10*3/uL (ref 150–400)
RBC: 3.88 MIL/uL — ABNORMAL LOW (ref 4.22–5.81)
RDW: 13 % (ref 11.5–15.5)
WBC: 9.3 10*3/uL (ref 4.0–10.5)
nRBC: 0 % (ref 0.0–0.2)

## 2020-10-15 LAB — GLUCOSE, CAPILLARY
Glucose-Capillary: 165 mg/dL — ABNORMAL HIGH (ref 70–99)
Glucose-Capillary: 343 mg/dL — ABNORMAL HIGH (ref 70–99)
Glucose-Capillary: 330 mg/dL — ABNORMAL HIGH (ref 70–99)

## 2020-10-15 LAB — ECHOCARDIOGRAM COMPLETE
Area-P 1/2: 2.73 cm2
Calc EF: 59 %
Height: 70 in
S' Lateral: 4.4 cm
Single Plane A2C EF: 64.2 %
Single Plane A4C EF: 50.8 %
Weight: 5040 oz

## 2020-10-15 LAB — BASIC METABOLIC PANEL
Anion gap: 15 (ref 5–15)
BUN: 65 mg/dL — ABNORMAL HIGH (ref 6–20)
CO2: 23 mmol/L (ref 22–32)
Calcium: 9.3 mg/dL (ref 8.9–10.3)
Chloride: 100 mmol/L (ref 98–111)
Creatinine, Ser: 3.06 mg/dL — ABNORMAL HIGH (ref 0.61–1.24)
GFR, Estimated: 23 mL/min — ABNORMAL LOW (ref >60)
Glucose, Bld: 156 mg/dL — ABNORMAL HIGH (ref 70–99)
Potassium: 4.5 mmol/L (ref 3.5–5.1)
Sodium: 138 mmol/L (ref 135–145)

## 2020-10-15 LAB — HEMOGLOBIN A1C
Hgb A1c MFr Bld: 10.8 % — ABNORMAL HIGH (ref 4.8–5.6)
Mean Plasma Glucose: 263.26 mg/dL

## 2020-10-15 LAB — BRAIN NATRIURETIC PEPTIDE: B Natriuretic Peptide: 40 pg/mL (ref 0.0–100.0)

## 2020-10-15 LAB — RESP PANEL BY RT-PCR (FLU A&B, COVID) ARPGX2
Influenza A by PCR: NEGATIVE
Influenza B by PCR: NEGATIVE
SARS Coronavirus 2 by RT PCR: NEGATIVE

## 2020-10-15 LAB — POC OCCULT BLOOD, ED: Fecal Occult Bld: POSITIVE — AB

## 2020-10-15 MED ORDER — SODIUM CHLORIDE 0.9% FLUSH
3.0000 mL | Freq: Two times a day (BID) | INTRAVENOUS | Status: DC
  Administered 2020-10-15 – 2020-10-17 (×5): 3 mL via INTRAVENOUS

## 2020-10-15 MED ORDER — SODIUM CHLORIDE 0.9 % IV BOLUS (SEPSIS)
1000.0000 mL | Freq: Once | INTRAVENOUS | Status: AC
  Administered 2020-10-15: 1000 mL via INTRAVENOUS

## 2020-10-15 MED ORDER — INSULIN ASPART 100 UNIT/ML ~~LOC~~ SOLN
0.0000 [IU] | Freq: Every day | SUBCUTANEOUS | Status: DC
  Administered 2020-10-15 – 2020-10-16 (×2): 4 [IU] via SUBCUTANEOUS

## 2020-10-15 MED ORDER — HYDROCODONE-ACETAMINOPHEN 7.5-325 MG PO TABS
1.0000 | ORAL_TABLET | Freq: Four times a day (QID) | ORAL | Status: DC | PRN
  Administered 2020-10-15 (×2): 1 via ORAL
  Filled 2020-10-15 (×2): qty 1

## 2020-10-15 MED ORDER — INSULIN ASPART 100 UNIT/ML ~~LOC~~ SOLN
0.0000 [IU] | Freq: Three times a day (TID) | SUBCUTANEOUS | Status: DC
  Administered 2020-10-15: 7 [IU] via SUBCUTANEOUS
  Administered 2020-10-15: 2 [IU] via SUBCUTANEOUS
  Administered 2020-10-16: 5 [IU] via SUBCUTANEOUS
  Administered 2020-10-16 (×2): 7 [IU] via SUBCUTANEOUS
  Administered 2020-10-17 (×2): 5 [IU] via SUBCUTANEOUS

## 2020-10-15 NOTE — ED Notes (Signed)
Pt provided with wet wash cloth to clean up. Pt sitting on side of the bed at this time. Pt instructed not to get out of bed before calling for assistance.Pts call bell within reach. Pt verbalizes understanding.

## 2020-10-15 NOTE — Progress Notes (Signed)
Patient makes inappropriate and sexual comments to nurse and about nursing staff even after redirection. Charge nurse notified.  Patient is noncompliant with heart healthy and carb modified diet, requesting staff to sneak him outside food. I overheard patient on the phone requesting guests to bring him salt and food.  [name]

## 2020-10-15 NOTE — Progress Notes (Signed)
  Echocardiogram 2D Echocardiogram has been performed.  [name] 10/15/2020, 10:59 AM

## 2020-10-15 NOTE — ED Notes (Signed)
Pt BP

## 2020-10-15 NOTE — TOC Progression Note (Signed)
Transition of Care [facility]) - Progression Note    Patient Details  Name: [name] MRN: [id number] Date of Birth: Mar 31, 1961  [facility] of Care [facility]) CM/SW Contact  Purcell Mouton, RN Phone Number: 10/15/2020, 12:50 PM  Clinical Narrative:    Pt states he has a nurse with [facility] sent by insurance co.    Expected Discharge Plan: Home/Self Care Barriers to Discharge: No Barriers Identified  Expected Discharge Plan and Services Expected Discharge Plan: Home/Self Care   Discharge Planning Services: CM Consult   Living arrangements for the past 2 months: Single Family Home                                       Social Determinants of Health (SDOH) Interventions    Readmission Risk Interventions No flowsheet data found.

## 2020-10-15 NOTE — ED Provider Notes (Addendum)
[facility] DEPT Provider Note   CSN: [id number] Arrival date & time: 10/15/20  [id number]     History Chief Complaint  Patient presents with  . Fall    [name] is a 59 y.o. 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  The patient states that he was sitting on the edge of the bed getting ready to stand to walk to the bathroom.  The patient states that he cannot recall what happened next, but he was on the floor with an injury to his nose and pain in his right arm and bilateral hands.  He is unsure if he had a syncopal episode.  He reports that he has been having room spinning dizziness over the last few days.  He denies gait instability.  He also reports that his blood pressure has been lower.  When he was seen in his PCPs office on 12/21 his pressure was noted to be 99/50s.  They did not make any adjustments to his home medications at that time.  He reports that about a week ago that he had a severe headache located behind his bilateral eyes and to his forehead that caused a severe "bright sensation" to his vision.  He reports that he was wearing sunglasses even indoors for the next 2 days due to the changes in his vision. He reports that the bright sensation is somewhat subsided, but has not resolved.   Although he does have a history of previous strokes, he states that his symptoms today do not feel similar to his previous strokes.  He also notes that he was having some generalized abdominal pain earlier in the week, but this has since resolved.   He has been having rectal pain due to a hemorrhoid that has been bleeding and he had his noticed blood in his stool.  He denies of blurry vision, diplopia, amaurosis fugax, numbness, weakness, slurred speech, feeling off balance, dysuria, hematuria, nausea,  vomiting, diarrhea, abdominal distention.   He is not on blood thinners.   The history is provided by the patient and medical records. No language interpreter was used.       Past Medical History:  Diagnosis Date  . Anemia   . Arthritis   . Back pain   . CAD (coronary artery disease)    a. s/p DES to LAD in 05/2016  . Cervical radiculopathy   . Chronic diastolic CHF (congestive heart failure) ([place])   . Chronic pain   . Depression   . DVT (deep venous thrombosis) ([place])   . Hematemesis   . Hepatic steatosis   . Hyperlipidemia   . Hypertension   . IBS (irritable bowel syndrome)   . Morbid obesity ([place])   . OSA (obstructive sleep apnea)   . Pancreatitis   . PE (pulmonary thromboembolism) ([place])   . Peripheral neuropathy   . PUD (peptic ulcer disease)   . Renal disorder   . Stroke [facility])    a. ?details unclear - not seen on imaging when he was admitted in 05/2017 for TIA symptoms which were felt due to cervical radiculopathy.  . Thoracic aortic ectasia (HCC)    a. 4.3cm ectatic ascending thoracic aorta by CT 06/2017.   . Type 2 diabetes mellitus [facility])     Patient Active Problem List   Diagnosis Date Noted  .  Syncope 10/15/2020  . OSA (obstructive sleep apnea)   . Upper GI bleed 04/28/2020  . Hypertensive urgency 04/27/2020  . Acute GI bleeding 04/27/2020  . Mixed diabetic hyperlipidemia associated with type 2 diabetes mellitus ([place]) 04/27/2020  . Chronic diastolic CHF (congestive heart failure) ([place]) 08/23/2019  . ARF (acute renal failure) ([place]) 08/01/2019  . Hyperkalemia 08/01/2019  . Uncontrolled type 2 diabetes mellitus with hyperglycemia ([place]) 08/01/2019  . AKI (acute kidney injury) ([place])   . Hypotension 07/31/2019  . DKA (diabetic ketoacidosis) ([place]) 05/05/2019  . Blurred vision 05/05/2019  . Alteration consciousness 11/19/2018  . Neck pain 10/09/2018  . Left arm weakness 10/09/2018  . B12 deficiency 08/10/2017  . Persistent headaches 08/09/2017  . GERD  (gastroesophageal reflux disease) 06/29/2017  . Left arm numbness   . Cerebral embolism with cerebral infarction 06/17/2017  . TIA (transient ischemic attack) 06/17/2017  . Chronic back pain 12/29/2016  . Depression 12/15/2016  . Vitamin D deficiency 12/09/2016  . Right hip pain 12/07/2016  . Hypokalemia 12/07/2016  . Type 2 diabetes mellitus with vascular disease ([place]) 05/31/2016  . Normocytic normochromic anemia 05/31/2016  . Chest pain 05/31/2016  . Coronary artery disease involving native coronary artery of native heart without angina pectoris 01/06/2016  . DVT (deep venous thrombosis) ([place]) 01/06/2016  . Lactic acidosis 05/28/2014  . Nonspecific chest pain 01/29/2014  . Uncontrolled secondary diabetes with peripheral neuropathy ([place]) 01/29/2014  . Obesity, Class III, BMI 40-49.9 (morbid obesity) ([place]) 01/29/2014  . Snoring 01/29/2014  . Dyslipidemia 01/29/2014  . HTN (hypertension) 01/29/2014  . Abnormal nuclear stress test 01/29/2014    Past Surgical History:  Procedure Laterality Date  . CARDIAC CATHETERIZATION N/A 05/31/2016   Procedure: Left Heart Cath and Coronary Angiography;  Surgeon: Peter M Martinique, MD;  Location: [place] CV LAB;  Service: Cardiovascular;  Laterality: N/A;  . CARDIAC CATHETERIZATION N/A 05/31/2016   Procedure: Intravascular Pressure Wire/FFR Study;  Surgeon: Peter M Martinique, MD;  Location: [place] CV LAB;  Service: Cardiovascular;  Laterality: N/A;  . CARDIAC CATHETERIZATION N/A 05/31/2016   Procedure: Coronary Stent Intervention;  Surgeon: Peter M Martinique, MD;  Location: [place] CV LAB;  Service: Cardiovascular;  Laterality: N/A;  . COLONOSCOPY WITH PROPOFOL N/A 04/29/2020   Procedure: COLONOSCOPY WITH PROPOFOL;  Surgeon: Wilford Corner, MD;  Location: [place];  Service: Endoscopy;  Laterality: N/A;  . ESOPHAGOGASTRODUODENOSCOPY N/A 04/29/2020   Procedure: ESOPHAGOGASTRODUODENOSCOPY (EGD);  Surgeon: Wilford Corner, MD;  Location: [place];  Service: Endoscopy;  Laterality: N/A;  . LEFT HEART CATH AND CORONARY ANGIOGRAPHY N/A 12/08/2017   Procedure: LEFT HEART CATH AND CORONARY ANGIOGRAPHY;  Surgeon: Leonie Man, MD;  Location: [name] CV LAB;  Service: Cardiovascular;  Laterality: N/A;  . LEFT HEART CATHETERIZATION WITH CORONARY ANGIOGRAM N/A 02/03/2014   Procedure: LEFT HEART CATHETERIZATION WITH CORONARY ANGIOGRAM;  Surgeon: Pixie Casino, MD;  Location: [facility] CATH LAB;  Service: Cardiovascular;  Laterality: N/A;  . left leg stent     . POLYPECTOMY  04/29/2020   Procedure: POLYPECTOMY;  Surgeon: Wilford Corner, MD;  Location: [facility] ENDOSCOPY;  Service: Endoscopy;;       Family History  Problem Relation Age of Onset  . Cancer Father   . Hypertension Mother   . Diabetes Mother   . Breast cancer Mother   . Hypertension Brother   . Diabetes Brother   . Hypertension Sister   . Diabetes Sister     Social History   Tobacco Use  .  Smoking status: Never Smoker  . Smokeless tobacco: Never Used  Vaping Use  . Vaping Use: Never used  Substance Use Topics  . Alcohol use: No  . Drug use: No    Home Medications Prior to Admission medications   Medication Sig Start Date End Date Taking? Authorizing Provider  Accu-Chek FastClix Lancets MISC 1 Device by Other route 2 (two) times daily.  03/20/19   [provider]  Albuterol Sulfate (PROAIR RESPICLICK) 573 (90 Base) MCG/ACT AEPB Inhale 2 puffs into the lungs every 6 (six) hours as needed. Patient taking differently: Inhale 2 puffs into the lungs every 6 (six) hours as needed (for breathing).  11/06/18   Robyn Haber, MD  allopurinol (ZYLOPRIM) 100 MG tablet Take 100 mg by mouth daily. 03/31/20   [provider]  aspirin 81 MG chewable tablet Chew 1 tablet (81 mg total) by mouth daily. 06/01/16   Elwin Mocha, MD  atorvastatin (LIPITOR) 40 MG tablet Take 1 tablet (40 mg total) by mouth daily. 02/12/20 07/17/21  Pixie Casino, MD   carvedilol (COREG) 25 MG tablet Take 1 tablet (25 mg total) by mouth 2 (two) times daily with a meal. 01/26/17   Strader, [place] M, PA-C  [employer] VITAMIN B12 1000 MCG tablet Take 1,000 mcg by mouth daily. 03/06/20   [provider]  diclofenac (FLECTOR) 1.3 % PTCH Place 1 patch onto the skin daily as needed for pain. 03/02/20   [provider]  DULoxetine (CYMBALTA) 60 MG capsule Take 1 capsule (60 mg total) by mouth daily. Please request future refills from PCP. 11/28/19   Marcial Pacas, MD  famotidine (PEPCID) 20 MG tablet Take 20 mg by mouth daily. 03/05/20   [provider]  fenofibrate (TRICOR) 145 MG tablet Take 1 tablet (145 mg total) by mouth daily. 06/18/20   Hilty, Nadean Corwin, MD  furosemide (LASIX) 40 MG tablet Take 1 tablet (40 mg total) by mouth 3 (three) times daily. 08/23/19 08/22/20  Darliss Cheney, MD  glipiZIDE (GLUCOTROL) 10 MG tablet Take 10 mg by mouth 2 (two) times daily. 05/30/17   [provider]  HUMULIN R U-500 KWIKPEN 500 UNIT/ML kwikpen Inject 0-85 Units into the skin in the morning, at noon, and at bedtime. Per sliding scale- 06/04/20   [provider]  Insulin Pen Needle 31G X 5 MM MISC Use 1 needle daily to inject insulin as prescribed Patient taking differently: 1 each by Other route See admin instructions. Use 1 needle daily to inject insulin as prescribed 06/18/17   Barton Dubois, MD  isosorbide mononitrate (IMDUR) 30 MG 24 hr tablet Take 1 tablet (30 mg total) by mouth daily. 10/16/19   Lendon Colonel, NP  lisinopril (ZESTRIL) 10 MG tablet Take 1 tablet (10 mg total) by mouth daily. 08/21/20   Eugenie Filler, MD  nitroGLYCERIN (NITROSTAT) 0.4 MG SL tablet Place 1 tablet (0.4 mg total) under the tongue every 5 (five) minutes as needed for chest pain. 02/06/19   Skeet Latch, MD  ondansetron (ZOFRAN) 4 MG tablet Take 4 mg by mouth 2 (two) times daily as needed for nausea/vomiting. 04/21/20   [provider]  oxyCODONE  (ROXICODONE) 15 MG immediate release tablet Take 15 mg 3 (three) times daily as needed by mouth for pain.  08/28/17   [provider]  pantoprazole (PROTONIX) 40 MG tablet Take 1 tablet (40 mg total) by mouth 2 (two) times daily. 04/30/20   Domenic Polite, MD  potassium chloride ([place])  10 MEQ CR capsule Take 10 mEq by mouth in the morning and at bedtime. 04/03/20   [provider]  pregabalin (LYRICA) 300 MG capsule Take 300 mg by mouth 2 (two) times daily. 05/18/19   [provider]  tiZANidine (ZANAFLEX) 4 MG tablet Take 4 mg by mouth 3 (three) times daily as needed for muscle spasms. 03/31/20   [provider]  traZODone (DESYREL) 50 MG tablet Take 1 tablet (50 mg total) by mouth at bedtime. 12/15/16   Burns, Arloa Koh, MD  Vitamin D, Ergocalciferol, (DRISDOL) 1.25 MG (50000 UT) CAPS capsule Take 50,000 Units by mouth once a week. mondays 08/15/19   [provider]    Allergies    Coconut flavor [flavoring agent], Coconut oil, Ibuprofen, Aleve [naproxen], Nsaids, and Vascepa [icosapent ethyl]  Review of Systems   Review of Systems  Constitutional: Negative for appetite change, chills, diaphoresis and fever.  HENT: Negative for congestion and sore throat.   Eyes: Positive for visual disturbance.  Respiratory: Negative for shortness of breath and wheezing.   Cardiovascular: Negative for chest pain and palpitations.  Gastrointestinal: Positive for blood in stool. Negative for abdominal pain, diarrhea, nausea and vomiting.  Genitourinary: Negative for dysuria and flank pain.  Musculoskeletal: Positive for arthralgias and myalgias. Negative for back pain and gait problem.  Skin: Positive for wound. Negative for rash.  Allergic/Immunologic: Negative for immunocompromised state.  Neurological: Positive for dizziness, syncope and headaches. Negative for seizures, weakness and numbness.  Psychiatric/Behavioral: Negative for confusion.    Physical  Exam Updated Vital Signs BP (!) 95/55   Pulse 74   Temp 97.8 F (36.6 C) (Oral)   Resp 20   Ht 5\' 10"  (1.778 m)   Wt (!) 142.9 kg   SpO2 98%   BMI 45.20 kg/m   Physical Exam Vitals and nursing note reviewed.  Constitutional:      Appearance: He is well-developed. He is obese.     Comments: Drowsy, but arouses to voice  HENT:     Head: Normocephalic.     Nose: Laceration present. No rhinorrhea.     Right Nostril: No septal hematoma.     Left Nostril: No septal hematoma.     Right Sinus: No maxillary sinus tenderness or frontal sinus tenderness.     Left Sinus: No maxillary sinus tenderness or frontal sinus tenderness.     Comments: There is a 1 cm laceration noted to the bridge of the nose.  Wound is hemostatic. Eyes:     Conjunctiva/sclera: Conjunctivae normal.  Cardiovascular:     Rate and Rhythm: Normal rate and regular rhythm.     Pulses: Normal pulses.     Heart sounds: Normal heart sounds. No murmur heard. No friction rub. No gallop.   Pulmonary:     Effort: Pulmonary effort is normal. No respiratory distress.     Breath sounds: No stridor. No wheezing, rhonchi or rales.  Chest:     Chest wall: No tenderness.  Abdominal:     General: There is no distension.     Palpations: Abdomen is soft. There is no mass.     Tenderness: There is no abdominal tenderness. There is no right CVA tenderness, left CVA tenderness, guarding or rebound.     Hernia: No hernia is present.  Musculoskeletal:     Cervical back: Neck supple.     Right lower leg: No edema.     Left lower leg: No edema.  Skin:    General:  Skin is warm and dry.     Comments: Compression stockings in place  Neurological:     Mental Status: He is alert.  Psychiatric:        Behavior: Behavior normal.     ED Results / Procedures / Treatments   Labs (all labs ordered are listed, but only abnormal results are displayed) Labs Reviewed  CBC - Abnormal; Notable for the following components:      Result  Value   RBC 3.88 (*)    Hemoglobin 12.0 (*)    HCT 36.1 (*)    All other components within normal limits  BASIC METABOLIC PANEL - Abnormal; Notable for the following components:   Glucose, Bld 156 (*)    BUN 65 (*)    Creatinine, Ser 3.06 (*)    GFR, Estimated 23 (*)    All other components within normal limits  POC OCCULT BLOOD, ED - Abnormal; Notable for the following components:   Fecal Occult Bld POSITIVE (*)    All other components within normal limits  RESP PANEL BY RT-PCR (FLU A&B, COVID) ARPGX2  BRAIN NATRIURETIC PEPTIDE    EKG EKG Interpretation  Date/Time:  Thursday October 15 2020 04:48:43 EST Ventricular Rate:  71 PR Interval:    QRS Duration: 83 QT Interval:  392 QTC Calculation: 426 R Axis:   80 Text Interpretation: Sinus rhythm Consider anterior infarct Confirmed by Pryor Curia [phone]) on 10/15/2020 8:03:01 AM   Radiology DG Forearm Right  Result Date: 10/15/2020 CLINICAL DATA:  Hand and arm pain after unwitnessed fall EXAM: RIGHT FOREARM - 2 VIEW COMPARISON:  None. FINDINGS: No evidence of forearm fracture or subluxation. Enthesophytes are present in the supracondylar humerus with mild marginal spurring at the proximal radius and ulna. Cluster of soft tissue calcifications in the ventral and proximal forearm which are rounded and usually seen with phleboliths. No visible superimposed soft tissue mass. IMPRESSION: No acute finding. Electronically Signed   By: Monte Fantasia M.D.   On: 10/15/2020 04:28   CT Head Wo Contrast  Result Date: 10/15/2020 CLINICAL DATA:  Syncope, fall from seated position, laceration to bridge of nose EXAM: CT HEAD WITHOUT CONTRAST CT MAXILLOFACIAL WITHOUT CONTRAST CT CERVICAL SPINE WITHOUT CONTRAST TECHNIQUE: Multidetector CT imaging of the head, cervical spine, and maxillofacial structures were performed using the standard protocol without intravenous contrast. Multiplanar CT image reconstructions of the cervical spine and  maxillofacial structures were also generated. COMPARISON:  CT head 05/04/2019, CTa head neck 07/03/2018, MR cervical spine 06/29/2018, MR head 07/01/2018 FINDINGS: CT HEAD FINDINGS Brain: No evidence of acute infarction, hemorrhage, hydrocephalus, extra-axial collection, visible mass lesion or mass effect. Basal cisterns are patent. Midline intracranial structures are unremarkable. Cerebellar tonsils are normally position. Benign dural calcifications, stable from priors. Vascular: Atherosclerotic calcification of the carotid siphons and intradural vertebral arteries. No hyperdense vessel. Skull: No significant scalp swelling or hematoma. Stable infiltration/scarring in the right frontal scalp. Benign dermal calcifications. No calvarial fracture or other acute or worrisome lesions of the skull. Other: None. CT MAXILLOFACIAL FINDINGS Osseous: No fracture of the bony orbits. A minimal age-indeterminate deformity of the right nasal bone. Slightly prominent vascular channel extending along the right maxillary sinus is unchanged from July. No other mid face fractures are seen. The pterygoid plates are intact. No visible or suspected temporal bone fractures. The mandible is intact. 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 No fractured or  avulsed teeth. Orbits: Proptotic appearance of the globes. The globes appear otherwise normal and symmetric. No retro septal gas, stranding or hemorrhage. Symmetric appearance of the extraocular musculature and optic nerve sheath complexes. Normal caliber of the superior ophthalmic veins. Sinuses: Mild nodular mural thickening in the ethmoids and right maxillary sinus. Layering fluid in the right maxillary sinus could reflect acute sinusitis or layering hemosinus. Mastoid air cells are predominantly clear. Middle ear cavities are clear. Ossicular chains appear  normally configured. Soft tissues: Soft tissue swelling and laceration across the nasal bridge. No other significant soft tissue swelling, gas or foreign body. Diffuse dermal calcifications are similar to prior. CT CERVICAL SPINE FINDINGS Alignment: Stabilization collar is absent at the time of examination. Straightening and slight reversal the normal cervical lordosis is somewhat more pronounced than on prior exam and may be related to positioning versus muscle spasm. No evidence of traumatic listhesis. No abnormally widened, perched or jumped facets. Normal alignment of the craniocervical and atlantoaxial articulations. Skull base and vertebrae: Photon starvation artifact may limit detection of subtle anomaly below the level of C4. No acute skull base fracture. No vertebral body fracture or height loss. Normal bone mineralization. No worrisome osseous lesions. Multilevel cervical spondylitic changes as below. Additional arthrosis at the atlantodental and basion dens interval. Soft tissues and spinal canal: No pre or paravertebral fluid or swelling. No visible canal hematoma. 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 Disc levels: Multilevel intervertebral disc height loss with spondylitic endplate changes. In combination with ossification of the posterior longitudinal ligament there is at least mild resulting canal stenosis C4-C6. Multilevel uncinate spurring and facet hypertrophic changes are present as well resulting in at most mild to moderate foraminal narrowing bilaterally C5-6, C6-7, right greater than left. Upper chest: No acute abnormality in the upper chest or imaged lung apices. Other: Normal thyroid. Cervical carotid atherosclerosis noted at the right carotid bifurcation and more proximal common carotid. IMPRESSION: 1. No acute intracranial abnormality. No significant scalp swelling or hematoma. 2. Soft tissue swelling and laceration across the nasal bridge with  likely acute minimally displaced fracture of the anterior right nasal bone. 3. 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 Likely contributing to the anterior translation of the bilateral temporomandibular joints though could correlate for tenderness. 4. No other acute facial bone fractures. 5. Layering fluid in the right maxillary sinus could reflect acute sinusitis or layering hemosinus. 6. Mild proptotic appearance of the globes, correlate with clinical findings. 7. No acute cervical spine fracture or traumatic listhesis. 8. Multilevel cervical spondylitic and facet hypertrophic changes as described above. 9. Intracranial and cervical atherosclerosis. Electronically Signed   By: Lovena Le M.D.   On: 10/15/2020 06:09   CT CERVICAL SPINE WO CONTRAST  Result Date: 10/15/2020 CLINICAL DATA:  Syncope, fall from seated position, laceration to bridge of nose EXAM: CT HEAD WITHOUT CONTRAST CT MAXILLOFACIAL WITHOUT CONTRAST CT CERVICAL SPINE WITHOUT CONTRAST TECHNIQUE: Multidetector CT imaging of the head, cervical spine, and maxillofacial structures were performed using the standard protocol without intravenous contrast. Multiplanar CT image reconstructions of the cervical spine and maxillofacial structures were also generated. COMPARISON:  CT head 05/04/2019, CTa head neck 07/03/2018, MR cervical spine 06/29/2018, MR head 07/01/2018 FINDINGS: CT HEAD FINDINGS Brain: No evidence of acute infarction, hemorrhage, hydrocephalus, extra-axial collection, visible mass lesion or mass effect. Basal cisterns are patent. Midline intracranial structures are unremarkable. Cerebellar tonsils are normally position. Benign dural calcifications, stable from priors. Vascular: Atherosclerotic calcification of the carotid  siphons and intradural vertebral arteries. No hyperdense vessel. Skull: No significant scalp swelling or hematoma. Stable infiltration/scarring in the right frontal scalp. Benign dermal  calcifications. No calvarial fracture or other acute or worrisome lesions of the skull. Other: None. CT MAXILLOFACIAL FINDINGS Osseous: No fracture of the bony orbits. A minimal age-indeterminate deformity of the right nasal bone. Slightly prominent vascular channel extending along the right maxillary sinus is unchanged from July. No other mid face fractures are seen. The pterygoid plates are intact. No visible or suspected temporal bone fractures. The mandible is intact. 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 No fractured or avulsed teeth. Orbits: Proptotic appearance of the globes. The globes appear otherwise normal and symmetric. No retro septal gas, stranding or hemorrhage. Symmetric appearance of the extraocular musculature and optic nerve sheath complexes. Normal caliber of the superior ophthalmic veins. Sinuses: Mild nodular mural thickening in the ethmoids and right maxillary sinus. Layering fluid in the right maxillary sinus could reflect acute sinusitis or layering hemosinus. Mastoid air cells are predominantly clear. Middle ear cavities are clear. Ossicular chains appear normally configured. Soft tissues: Soft tissue swelling and laceration across the nasal bridge. No other significant soft tissue swelling, gas or foreign body. Diffuse dermal calcifications are similar to prior. CT CERVICAL SPINE FINDINGS Alignment: Stabilization collar is absent at the time of examination. Straightening and slight reversal the normal cervical lordosis is somewhat more pronounced than on prior exam and may be related to positioning versus muscle spasm. No evidence of traumatic listhesis. No abnormally widened, perched or jumped facets. Normal alignment of the craniocervical and atlantoaxial articulations. Skull base and vertebrae: Photon starvation artifact may limit detection of subtle  anomaly below the level of C4. No acute skull base fracture. No vertebral body fracture or height loss. Normal bone mineralization. No worrisome osseous lesions. Multilevel cervical spondylitic changes as below. Additional arthrosis at the atlantodental and basion dens interval. Soft tissues and spinal canal: No pre or paravertebral fluid or swelling. No visible canal hematoma. 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 Disc levels: Multilevel intervertebral disc height loss with spondylitic endplate changes. In combination with ossification of the posterior longitudinal ligament there is at least mild resulting canal stenosis C4-C6. Multilevel uncinate spurring and facet hypertrophic changes are present as well resulting in at most mild to moderate foraminal narrowing bilaterally C5-6, C6-7, right greater than left. Upper chest: No acute abnormality in the upper chest or imaged lung apices. Other: Normal thyroid. Cervical carotid atherosclerosis noted at the right carotid bifurcation and more proximal common carotid. IMPRESSION: 1. No acute intracranial abnormality. No significant scalp swelling or hematoma. 2. Soft tissue swelling and laceration across the nasal bridge with likely acute minimally displaced fracture of the anterior right nasal bone. 3. 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 Likely contributing to the anterior translation of the bilateral temporomandibular joints though could correlate for tenderness. 4. No other acute facial bone fractures. 5. Layering fluid in the right maxillary sinus could reflect acute sinusitis or layering hemosinus. 6. Mild proptotic appearance of the globes, correlate with clinical findings. 7. No acute cervical spine fracture or traumatic listhesis. 8. Multilevel cervical spondylitic and facet hypertrophic changes as described above. 9. Intracranial and cervical atherosclerosis. Electronically Signed   By:  Lovena Le M.D.   On: 10/15/2020 06:09   DG Hand Complete Left  Result Date: 10/15/2020 CLINICAL DATA:  Fall  with hand pain EXAM: LEFT HAND - COMPLETE 3+ VIEW COMPARISON:  11/07/2018 FINDINGS: No acute fracture or dislocation. Symmetric to the right is diffuse degenerative marginal spurring especially of the interphalangeal joints but also affecting the carpus and MCP joints. No evidence of foreign body IMPRESSION: 1. No acute finding. 2. Diffuse degenerative spurring. Electronically Signed   By: Monte Fantasia M.D.   On: 10/15/2020 04:27   DG Hand Complete Right  Result Date: 10/15/2020 CLINICAL DATA:  Arm pain after unwitnessed fall/syncope. EXAM: RIGHT HAND - COMPLETE 3+ VIEW COMPARISON:  None. FINDINGS: Diffuse joint space narrowing and/or marginal spurring of the interphalangeal and metacarpal phalangeal joints. Mild spurring at the carpus and carpometacarpal joints. No evidence of fracture, dislocation, or foreign body. IMPRESSION: 1. No acute finding. 2. Diffuse osteoarthritic spurring. Electronically Signed   By: Monte Fantasia M.D.   On: 10/15/2020 04:26   CT Maxillofacial Wo Contrast  Result Date: 10/15/2020 CLINICAL DATA:  Syncope, fall from seated position, laceration to bridge of nose EXAM: CT HEAD WITHOUT CONTRAST CT MAXILLOFACIAL WITHOUT CONTRAST CT CERVICAL SPINE WITHOUT CONTRAST TECHNIQUE: Multidetector CT imaging of the head, cervical spine, and maxillofacial structures were performed using the standard protocol without intravenous contrast. Multiplanar CT image reconstructions of the cervical spine and maxillofacial structures were also generated. COMPARISON:  CT head 05/04/2019, CTa head neck 07/03/2018, MR cervical spine 06/29/2018, MR head 07/01/2018 FINDINGS: CT HEAD FINDINGS Brain: No evidence of acute infarction, hemorrhage, hydrocephalus, extra-axial collection, visible mass lesion or mass effect. Basal cisterns are patent. Midline intracranial structures are  unremarkable. Cerebellar tonsils are normally position. Benign dural calcifications, stable from priors. Vascular: Atherosclerotic calcification of the carotid siphons and intradural vertebral arteries. No hyperdense vessel. Skull: No significant scalp swelling or hematoma. Stable infiltration/scarring in the right frontal scalp. Benign dermal calcifications. No calvarial fracture or other acute or worrisome lesions of the skull. Other: None. CT MAXILLOFACIAL FINDINGS Osseous: No fracture of the bony orbits. A minimal age-indeterminate deformity of the right nasal bone. Slightly prominent vascular channel extending along the right maxillary sinus is unchanged from July. No other mid face fractures are seen. The pterygoid plates are intact. No visible or suspected temporal bone fractures. The mandible is intact. 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 No fractured or avulsed teeth. Orbits: Proptotic appearance of the globes. The globes appear otherwise normal and symmetric. No retro septal gas, stranding or hemorrhage. Symmetric appearance of the extraocular musculature and optic nerve sheath complexes. Normal caliber of the superior ophthalmic veins. Sinuses: Mild nodular mural thickening in the ethmoids and right maxillary sinus. Layering fluid in the right maxillary sinus could reflect acute sinusitis or layering hemosinus. Mastoid air cells are predominantly clear. Middle ear cavities are clear. Ossicular chains appear normally configured. Soft tissues: Soft tissue swelling and laceration across the nasal bridge. No other significant soft tissue swelling, gas or foreign body. Diffuse dermal calcifications are similar to prior. CT CERVICAL SPINE FINDINGS Alignment: Stabilization collar is absent at the time of examination. Straightening and slight reversal the normal cervical lordosis  is somewhat more pronounced than on prior exam and may be related to positioning versus muscle spasm. No evidence of traumatic listhesis. No abnormally widened, perched or jumped facets. Normal alignment of the craniocervical and atlantoaxial articulations. Skull base and vertebrae: Photon starvation artifact may limit detection of subtle anomaly below the level of C4. No acute skull base fracture. No  vertebral body fracture or height loss. Normal bone mineralization. No worrisome osseous lesions. Multilevel cervical spondylitic changes as below. Additional arthrosis at the atlantodental and basion dens interval. Soft tissues and spinal canal: No pre or paravertebral fluid or swelling. No visible canal hematoma. 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 Disc levels: Multilevel intervertebral disc height loss with spondylitic endplate changes. In combination with ossification of the posterior longitudinal ligament there is at least mild resulting canal stenosis C4-C6. Multilevel uncinate spurring and facet hypertrophic changes are present as well resulting in at most mild to moderate foraminal narrowing bilaterally C5-6, C6-7, right greater than left. Upper chest: No acute abnormality in the upper chest or imaged lung apices. Other: Normal thyroid. Cervical carotid atherosclerosis noted at the right carotid bifurcation and more proximal common carotid. IMPRESSION: 1. No acute intracranial abnormality. No significant scalp swelling or hematoma. 2. Soft tissue swelling and laceration across the nasal bridge with likely acute minimally displaced fracture of the anterior right nasal bone. 3. 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 Likely contributing to the anterior translation of the bilateral temporomandibular joints though could correlate for tenderness. 4. No other acute facial bone fractures. 5. Layering fluid in the right maxillary sinus could reflect  acute sinusitis or layering hemosinus. 6. Mild proptotic appearance of the globes, correlate with clinical findings. 7. No acute cervical spine fracture or traumatic listhesis. 8. Multilevel cervical spondylitic and facet hypertrophic changes as described above. 9. Intracranial and cervical atherosclerosis. Electronically Signed   By: Lovena Le M.D.   On: 10/15/2020 06:09    Procedures .Marland KitchenLaceration Repair  Date/Time: 10/15/2020 7:04 AM Performed by: Joanne Gavel, PA-C Authorized by: Joanne Gavel, PA-C   Consent:    Consent obtained:  Verbal   Consent given by:  Patient   Risks, benefits, and alternatives were discussed: yes     Risks discussed:  Infection, poor cosmetic result, need for additional repair and nerve damage   Alternatives discussed:  No treatment Universal protocol:    Procedure explained and questions answered to patient or proxy's satisfaction: yes     Relevant documents present and verified: yes     Test results available: yes     Imaging studies available: yes     Site/side marked: no     Patient identity confirmed:  Verbally with patient Anesthesia:    Anesthesia method:  None Laceration details:    Location: nose.   Length (cm):  1 Pre-procedure details:    Preparation:  Patient was prepped and draped in usual sterile fashion and imaging obtained to evaluate for foreign bodies Exploration:    Limited defect created (wound extended): no     Hemostasis achieved with:  Direct pressure   Imaging obtained comment:  CT   Imaging outcome: foreign body not noted     Wound exploration: wound explored through full range of motion and entire depth of wound visualized     Wound extent: underlying fracture     Wound extent: no areolar tissue violation noted, no fascia violation noted, no muscle damage noted, no nerve damage noted, no tendon damage noted and no vascular damage noted     Contaminated: no   Treatment:    Area cleansed with:  Shur-Clens   Amount  of cleaning:  Extensive   Irrigation method:  Pressure wash   Visualized foreign bodies/material removed: no     Debridement:  None   Undermining:  None Skin repair:  Repair method:  Tissue adhesive Approximation:    Approximation:  Close Repair type:    Repair type:  Simple Post-procedure details:    Dressing:  Open (no dressing)   Procedure completion:  Tolerated well, no immediate complications  .Critical Care Performed by: Joanne Gavel, PA-C Authorized by: Joanne Gavel, PA-C   Critical care provider statement:    Critical care time (minutes):  45   Critical care time was exclusive of:  Separately billable procedures and treating other patients and teaching time   Critical care was necessary to treat or prevent imminent or life-threatening deterioration of the following conditions:  Dehydration (Hypotension)   Critical care was time spent personally by me on the following activities:  Ordering and performing treatments and interventions, ordering and review of laboratory studies, ordering and review of radiographic studies, pulse oximetry, re-evaluation of patient's condition, review of old charts, obtaining history from patient or surrogate, examination of patient, evaluation of patient's response to treatment and development of treatment plan with patient or surrogate   I assumed direction of critical care for this patient from another provider in my specialty: no     Care discussed with: admitting provider     (including critical care time)  Medications Ordered in ED Medications  sodium chloride 0.9 % bolus 1,000 mL (1,000 mLs Intravenous New Bag/Given 10/15/20 0655)  sodium chloride 0.9 % bolus 1,000 mL (1,000 mLs Intravenous New Bag/Given 10/15/20 [id number])    ED Course  I have reviewed the triage vital signs and the nursing notes.  Pertinent labs & imaging results that were available during my care of the patient were reviewed by me and considered in my medical  decision making (see chart for details).    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  EMS with a questionable blood pressure of 50/34 on arrival that was palpated.  Patient's blood pressure has been 90s over 50s and 80s over 50s since arrival in the ER.  No tachycardia, tachypnea, hypoxia.  Patient is morbidly obese he has been having rectal bleeding secondary to a hemorrhoid.  Is also been endorsing dizziness and notes that about a week ago he had a severe headache behind his bilateral eyes and forehead with a change in his bilateral vision that he states his vision felt extremely bright this is persisted, but is significantly subsided since onset.  The patient was seen and independently valuated by Dr. Leonides Schanz, attending physician.  Labs and imaging have been independently reviewed and interpreted by me.  CT head with anterior right nasal bone fracture.  There is also layering fluid in the right maxillary sinus that could reflect acute sinusitis versus blaring hemosinus.  No other acute fractures.  Hemoccult is positive, likely secondary to bleeding from his hemorrhoid.  His hemoglobin is stable at 12.  He also has an AKI with a creatinine of 3.06, up from 0.71 in October.  Given his elevated creatinine, suspect hypovolemia since the patient is on Lasix 3 times daily, which could be the etiology of his hypotension.  Orthostatic vital signs obtained, 77/43 ->84/56 ->78/41, respectively.  Patient also notes that earlier this week he was  seen at his PCPs office and blood pressure was noted to be 90s/50s, but no medication adjustments were made at that time.  Patient has now received 2 L of IV fluids in the ER and BP is 100s/50s.   Given his AKI and persistent  hypotension, he will require admission.  I spoke with Dr. Marylyn Ishihara, hospitalist, who will accept the patient for admission.  The patient appears reasonably stabilized for admission considering the current resources, flow, and capabilities available in the ED at this time, and I doubt any other [facility] requiring further screening and/or treatment in the ED prior to admission.  Final Clinical Impression(s) / ED Diagnoses Final diagnoses:  Syncope, unspecified syncope type  Open fracture of nasal bone, initial encounter  Hypotension, unspecified hypotension type  AKI (acute kidney injury) [facility])    Rx / DC Orders ED Discharge Orders    None     ADDENDUM: Critical care time added   Joanne Gavel, PA-C 10/15/20 0759    Cheyne Boulden, Laymond Purser, PA-C 10/15/20 0804    Ward, Delice Bison, DO 10/15/20 [id number]

## 2020-10-15 NOTE — ED Triage Notes (Signed)
Pt arrives EMS after a fall/ possible syncopal episode at home. Pt reports sitting on edge ob bed and falling. Laceration and blood loss to bridge of nose. Pt alert. Hx of m  ultiple CVA. Pt unstable at baseline. Doesn't use a cane or walker unless needed.

## 2020-10-15 NOTE — ED Notes (Signed)
Another staff member walking by told this writer that patient was in the hallway sitting in a stool and needed help. This Probation officer and other staff members saw patient in the hallway sitting in a stool and patient was urinating on the floor.  Pt stated that she had to use the restroom again. This Probation officer and RN got patient to the bathroom and cleaned up and safely back in bed with seizure pads on bed rails and bed alarm now activated. Patient resting comfortably in bed now.

## 2020-10-15 NOTE — H&P (Signed)
History and Physical    [name] [id number] DOB: 1961/09/10 DOA: 10/15/2020  PCP: [name], FNP  Patient coming from: Home  Chief Complaint: "passing out"  HPI: [name] is a 59 y.o. male with medical history significant of DM2, chronic pain, CAD, HFpEF. Presenting after a syncopal episode w/ fall. He reports that earlier yesterday he was feeling "woosy and tired" but generally ok. He went to sleep without issue. He got up this morning to go to the bathroom and fell. He does not remember the entire fall. He knows he fell onto his face. He doesn't think he was out long because the act of hitting his face woke him out. It was difficult for him to get up, so he called EMS for help. They got him and brought him to the ED. He denies any other alleviating or aggravating factors.    ED Course: Laceration to the bridge of the nose was sutured. Found to have AKI and found to be hypotensive. He was given 2L NS bolus. TRH was called for admission.   Review of Systems:  Denies CP, palpitations, seizure-like activity, incontinence, dyspnea, room spinning, F/N/V/D. Review of systems is otherwise negative for all not mentioned in HPI.   PMHx Past Medical History:  Diagnosis Date  . Anemia   . Arthritis   . Back pain   . CAD (coronary artery disease)    a. s/p DES to LAD in 05/2016  . Cervical radiculopathy   . Chronic diastolic CHF (congestive heart failure) ([place])   . Chronic pain   . Depression   . DVT (deep venous thrombosis) ([place])   . Hematemesis   . Hepatic steatosis   . Hyperlipidemia   . Hypertension   . IBS (irritable bowel syndrome)   . Morbid obesity ([place])   . OSA (obstructive sleep apnea)   . Pancreatitis   . PE (pulmonary thromboembolism) ([place])   . Peripheral neuropathy   . PUD (peptic ulcer disease)   . Renal disorder   . Stroke [facility])    a. ?details unclear - not seen on imaging when he was admitted in 05/2017 for TIA symptoms which were felt due to  cervical radiculopathy.  . Thoracic aortic ectasia (H[facility])    a. 4.3cm ectatic ascending thoracic aorta by CT 06/2017.   . Type 2 diabetes mellitus ([place])     PSHx Past Surgical History:  Procedure Laterality Date  . CARDIAC CATHETERIZATION N/A 05/31/2016   Procedure: Left Heart Cath and Coronary Angiography;  Surgeon: Peter M Martinique, MD;  Location: [place] CV LAB;  Service: Cardiovascular;  Laterality: N/A;  . CARDIAC CATHETERIZATION N/A 05/31/2016   Procedure: Intravascular Pressure Wire/FFR Study;  Surgeon: Peter M Martinique, MD;  Location: [place] [facility];  Service: Cardiovascular;  Laterality: N/A;  . CARDIAC CATHETERIZATION N/A 05/31/2016   Procedure: Coronary Stent Intervention;  Surgeon: Peter M Martinique, MD;  Location: [place] [facility];  Service: Cardiovascular;  Laterality: N/A;  . COLONOSCOPY WITH PROPOFOL N/A 04/29/2020   Procedure: COLONOSCOPY WITH PROPOFOL;  Surgeon: Wilford Corner, MD;  Location: [place];  Service: Endoscopy;  Laterality: N/A;  . ESOPHAGOGASTRODUODENOSCOPY N/A 04/29/2020   Procedure: ESOPHAGOGASTRODUODENOSCOPY (EGD);  Surgeon: Wilford Corner, MD;  Location: [place];  Service: Endoscopy;  Laterality: N/A;  . LEFT HEART CATH AND CORONARY ANGIOGRAPHY N/A 12/08/2017   Procedure: LEFT HEART CATH AND CORONARY ANGIOGRAPHY;  Surgeon: Leonie Man, MD;  Location: [place] [facility];  Service: Cardiovascular;  Laterality: N/A;  . LEFT HEART CATHETERIZATION WITH CORONARY ANGIOGRAM N/A 02/03/2014   Procedure: LEFT HEART CATHETERIZATION WITH CORONARY ANGIOGRAM;  Surgeon: Pixie Casino, MD;  Location: [facility] CATH LAB;  Service: Cardiovascular;  Laterality: N/A;  . left leg stent     . POLYPECTOMY  04/29/2020   Procedure: POLYPECTOMY;  Surgeon: Wilford Corner, MD;  Location: [facility] ENDOSCOPY;  Service: Endoscopy;;    SocHx  reports that he has never smoked. He has never used smokeless tobacco. He reports that he does not drink alcohol and does not use  drugs.  Allergies  Allergen Reactions  . Coconut Flavor [Flavoring Agent] Hives  . Coconut Oil Hives  . Ibuprofen Other (See Comments)    Made gastric ulcers worse  . Aleve [Naproxen] Other (See Comments)    DUE TO KIDNEYS  . Nsaids Other (See Comments)    Stomach ulcers   . Vascepa [Icosapent Ethyl] Other (See Comments)    headaches, chest pain, similar to sx of a stroke, hypotension     FamHx Family History  Problem Relation Age of Onset  . Cancer Father   . Hypertension Mother   . Diabetes Mother   . Breast cancer Mother   . Hypertension Brother   . Diabetes Brother   . Hypertension Sister   . Diabetes Sister     Prior to Admission medications   Medication Sig Start Date End Date Taking? Authorizing Provider  Accu-Chek FastClix Lancets MISC 1 Device by Other route 2 (two) times daily.  03/20/19   [provider]  Albuterol Sulfate (PROAIR RESPICLICK) 123XX123 (90 Base) MCG/ACT AEPB Inhale 2 puffs into the lungs every 6 (six) hours as needed. Patient taking differently: Inhale 2 puffs into the lungs every 6 (six) hours as needed (for breathing).  11/06/18   Robyn Haber, MD  allopurinol (ZYLOPRIM) 100 MG tablet Take 100 mg by mouth daily. 03/31/20   [provider]  aspirin 81 MG chewable tablet Chew 1 tablet (81 mg total) by mouth daily. 06/01/16   Elwin Mocha, MD  atorvastatin (LIPITOR) 40 MG tablet Take 1 tablet (40 mg total) by mouth daily. 02/12/20 07/17/21  Pixie Casino, MD  carvedilol (COREG) 25 MG tablet Take 1 tablet (25 mg total) by mouth 2 (two) times daily with a meal. 01/26/17   Strader, [place] M, PA-C  [facility] VITAMIN B12 1000 MCG tablet Take 1,000 mcg by mouth daily. 03/06/20   [provider]  diclofenac (FLECTOR) 1.3 % PTCH Place 1 patch onto the skin daily as needed for pain. 03/02/20   [provider]  DULoxetine (CYMBALTA) 60 MG capsule Take 1 capsule (60 mg total) by mouth daily. Please request future refills from PCP.  11/28/19   Marcial Pacas, MD  famotidine (PEPCID) 20 MG tablet Take 20 mg by mouth daily. 03/05/20   [provider]  fenofibrate (TRICOR) 145 MG tablet Take 1 tablet (145 mg total) by mouth daily. 06/18/20   Hilty, Nadean Corwin, MD  furosemide (LASIX) 40 MG tablet Take 1 tablet (40 mg total) by mouth 3 (three) times daily. 08/23/19 08/22/20  Darliss Cheney, MD  glipiZIDE (GLUCOTROL) 10 MG tablet Take 10 mg by mouth 2 (two) times daily. 05/30/17   [provider]  HUMULIN R U-500 KWIKPEN 500 UNIT/ML kwikpen Inject 0-85 Units into the skin in the morning, at noon, and at bedtime. Per sliding scale- 06/04/20   [provider]  Insulin Pen Needle 31G X 5 MM MISC Use 1  needle daily to inject insulin as prescribed Patient taking differently: 1 each by Other route See admin instructions. Use 1 needle daily to inject insulin as prescribed 06/18/17   Barton Dubois, MD  isosorbide mononitrate (IMDUR) 30 MG 24 hr tablet Take 1 tablet (30 mg total) by mouth daily. 10/16/19   Lendon Colonel, NP  lisinopril (ZESTRIL) 10 MG tablet Take 1 tablet (10 mg total) by mouth daily. 08/21/20   Eugenie Filler, MD  nitroGLYCERIN (NITROSTAT) 0.4 MG SL tablet Place 1 tablet (0.4 mg total) under the tongue every 5 (five) minutes as needed for chest pain. 02/06/19   Skeet Latch, MD  ondansetron (ZOFRAN) 4 MG tablet Take 4 mg by mouth 2 (two) times daily as needed for nausea/vomiting. 04/21/20   [provider]  oxyCODONE (ROXICODONE) 15 MG immediate release tablet Take 15 mg 3 (three) times daily as needed by mouth for pain.  08/28/17   [provider]  pantoprazole (PROTONIX) 40 MG tablet Take 1 tablet (40 mg total) by mouth 2 (two) times daily. 04/30/20   Domenic Polite, MD  potassium chloride (MICRO-K) 10 MEQ CR capsule Take 10 mEq by mouth in the morning and at bedtime. 04/03/20   [provider]  pregabalin (LYRICA) 300 MG capsule Take 300 mg by mouth 2 (two) times daily.  05/18/19   [provider]  tiZANidine (ZANAFLEX) 4 MG tablet Take 4 mg by mouth 3 (three) times daily as needed for muscle spasms. 03/31/20   [provider]  traZODone (DESYREL) 50 MG tablet Take 1 tablet (50 mg total) by mouth at bedtime. 12/15/16   Burns, Arloa Koh, MD  Vitamin D, Ergocalciferol, (DRISDOL) 1.25 MG (50000 UT) CAPS capsule Take 50,000 Units by mouth once a week. mondays 08/15/19   [provider]    Physical Exam: Vitals:   10/15/20 0322 10/15/20 0324 10/15/20 0515 10/15/20 0615  BP: (!) 104/52  (!) 91/53 91/65  Pulse: 73  66 72  Resp: 16  20 20   Temp: 97.8 F (36.6 C)     TempSrc: Oral     SpO2: 90%  98% 98%  Weight:  (!) 142.9 kg    Height:  5\' 10"  (1.778 m)      General: 59 y.o. male resting in bed in NAD Eyes: PERRL, normal sclera ENMT: Nares patent w/o discharge, laceration on bridge of nose, orophaynx clear, dentition normal, ears w/o discharge/lesions/ulcers Neck: Supple, trachea midline Cardiovascular: RRR, +S1, S2, no m/g/r, equal pulses throughout Respiratory: CTABL, no w/r/r, normal WOB GI: BS+, NDNT, no masses noted, no organomegaly noted MSK: No e/c/c, wearing compression stocking Skin: No rashes, bruises, ulcerations noted Neuro: A&O x 3, no focal deficits Psyc: Appropriate interaction and affect, calm/cooperative  Labs on Admission: I have personally reviewed following labs and imaging studies  CBC: Recent Labs  Lab 10/15/20 0359  WBC 9.3  HGB 12.0*  HCT 36.1*  MCV 93.0  PLT 361   Basic Metabolic Panel: Recent Labs  Lab 10/15/20 0359  NA 138  K 4.5  CL 100  CO2 23  GLUCOSE 156*  BUN 65*  CREATININE 3.06*  CALCIUM 9.3   GFR: Estimated Creatinine Clearance: 38 mL/min (A) (by C-G formula based on SCr of 3.06 mg/dL (H)). Liver Function Tests: No results for input(s): AST, ALT, ALKPHOS, BILITOT, PROT, ALBUMIN in the last 168 hours. No results for input(s): LIPASE, AMYLASE in the last 168 hours. No results  for input(s): AMMONIA in the last 168 hours.  Coagulation Profile: No results for input(s): INR, PROTIME in the last 168 hours. Cardiac Enzymes: No results for input(s): CKTOTAL, CKMB, CKMBINDEX, TROPONINI in the last 168 hours. BNP (last 3 results) No results for input(s): PROBNP in the last 8760 hours. HbA1C: No results for input(s): HGBA1C in the last 72 hours. CBG: No results for input(s): GLUCAP in the last 168 hours. Lipid Profile: No results for input(s): CHOL, HDL, LDLCALC, TRIG, CHOLHDL, LDLDIRECT in the last 72 hours. Thyroid Function Tests: No results for input(s): TSH, T4TOTAL, FREET4, T3FREE, THYROIDAB in the last 72 hours. Anemia Panel: No results for input(s): VITAMINB12, FOLATE, FERRITIN, TIBC, IRON, RETICCTPCT in the last 72 hours. Urine analysis:    Component Value Date/Time   COLORURINE COLORLESS (A) 08/18/2020 0044   APPEARANCEUR CLEAR 08/18/2020 0044   LABSPEC 1.020 08/18/2020 0044   PHURINE 5.0 08/18/2020 0044   GLUCOSEU >=500 (A) 08/18/2020 0044   HGBUR NEGATIVE 08/18/2020 0044   BILIRUBINUR NEGATIVE 08/18/2020 0044   KETONESUR 20 (A) 08/18/2020 0044   PROTEINUR NEGATIVE 08/18/2020 0044   UROBILINOGEN 0.2 05/27/2014 2312   NITRITE NEGATIVE 08/18/2020 0044   LEUKOCYTESUR NEGATIVE 08/18/2020 0044    Radiological Exams on Admission: DG Forearm Right  Result Date: 10/15/2020 CLINICAL DATA:  Hand and arm pain after unwitnessed fall EXAM: RIGHT FOREARM - 2 VIEW COMPARISON:  None. FINDINGS: No evidence of forearm fracture or subluxation. Enthesophytes are present in the supracondylar humerus with mild marginal spurring at the proximal radius and ulna. Cluster of soft tissue calcifications in the ventral and proximal forearm which are rounded and usually seen with phleboliths. No visible superimposed soft tissue mass. IMPRESSION: No acute finding. Electronically Signed   By: Monte Fantasia M.D.   On: 10/15/2020 04:28   CT Head Wo Contrast  Result Date:  10/15/2020 CLINICAL DATA:  Syncope, fall from seated position, laceration to bridge of nose EXAM: CT HEAD WITHOUT CONTRAST CT MAXILLOFACIAL WITHOUT CONTRAST CT CERVICAL SPINE WITHOUT CONTRAST TECHNIQUE: Multidetector CT imaging of the head, cervical spine, and maxillofacial structures were performed using the standard protocol without intravenous contrast. Multiplanar CT image reconstructions of the cervical spine and maxillofacial structures were also generated. COMPARISON:  CT head 05/04/2019, CTa head neck 07/03/2018, MR cervical spine 06/29/2018, MR head 07/01/2018 FINDINGS: CT HEAD FINDINGS Brain: No evidence of acute infarction, hemorrhage, hydrocephalus, extra-axial collection, visible mass lesion or mass effect. Basal cisterns are patent. Midline intracranial structures are unremarkable. Cerebellar tonsils are normally position. Benign dural calcifications, stable from priors. Vascular: Atherosclerotic calcification of the carotid siphons and intradural vertebral arteries. No hyperdense vessel. Skull: No significant scalp swelling or hematoma. Stable infiltration/scarring in the right frontal scalp. Benign dermal calcifications. No calvarial fracture or other acute or worrisome lesions of the skull. Other: None. CT MAXILLOFACIAL FINDINGS Osseous: No fracture of the bony orbits. A minimal age-indeterminate deformity of the right nasal bone. Slightly prominent vascular channel extending along the right maxillary sinus is unchanged from July. No other mid face fractures are seen. The pterygoid plates are intact. No visible or suspected temporal bone fractures. The mandible is intact. 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 No fractured or avulsed teeth. Orbits: Proptotic appearance of the globes. The globes appear otherwise normal and symmetric. No retro septal gas, stranding or  hemorrhage. Symmetric appearance of the extraocular musculature and optic nerve sheath complexes. Normal caliber of the superior ophthalmic veins. Sinuses: Mild nodular mural thickening  in the ethmoids and right maxillary sinus. Layering fluid in the right maxillary sinus could reflect acute sinusitis or layering hemosinus. Mastoid air cells are predominantly clear. Middle ear cavities are clear. Ossicular chains appear normally configured. Soft tissues: Soft tissue swelling and laceration across the nasal bridge. No other significant soft tissue swelling, gas or foreign body. Diffuse dermal calcifications are similar to prior. CT CERVICAL SPINE FINDINGS Alignment: Stabilization collar is absent at the time of examination. Straightening and slight reversal the normal cervical lordosis is somewhat more pronounced than on prior exam and may be related to positioning versus muscle spasm. No evidence of traumatic listhesis. No abnormally widened, perched or jumped facets. Normal alignment of the craniocervical and atlantoaxial articulations. Skull base and vertebrae: Photon starvation artifact may limit detection of subtle anomaly below the level of C4. No acute skull base fracture. No vertebral body fracture or height loss. Normal bone mineralization. No worrisome osseous lesions. Multilevel cervical spondylitic changes as below. Additional arthrosis at the atlantodental and basion dens interval. Soft tissues and spinal canal: No pre or paravertebral fluid or swelling. No visible canal hematoma. 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 Disc levels: Multilevel intervertebral disc height loss with spondylitic endplate changes. In combination with ossification of the posterior longitudinal ligament there is at least mild resulting canal stenosis C4-C6. Multilevel uncinate spurring and facet hypertrophic changes are present as well resulting in at most mild to moderate foraminal  narrowing bilaterally C5-6, C6-7, right greater than left. Upper chest: No acute abnormality in the upper chest or imaged lung apices. Other: Normal thyroid. Cervical carotid atherosclerosis noted at the right carotid bifurcation and more proximal common carotid. IMPRESSION: 1. No acute intracranial abnormality. No significant scalp swelling or hematoma. 2. Soft tissue swelling and laceration across the nasal bridge with likely acute minimally displaced fracture of the anterior right nasal bone. 3. 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 Likely contributing to the anterior translation of the bilateral temporomandibular joints though could correlate for tenderness. 4. No other acute facial bone fractures. 5. Layering fluid in the right maxillary sinus could reflect acute sinusitis or layering hemosinus. 6. Mild proptotic appearance of the globes, correlate with clinical findings. 7. No acute cervical spine fracture or traumatic listhesis. 8. Multilevel cervical spondylitic and facet hypertrophic changes as described above. 9. Intracranial and cervical atherosclerosis. Electronically Signed   By: Lovena Le M.D.   On: 10/15/2020 06:09   CT CERVICAL SPINE WO CONTRAST  Result Date: 10/15/2020 CLINICAL DATA:  Syncope, fall from seated position, laceration to bridge of nose EXAM: CT HEAD WITHOUT CONTRAST CT MAXILLOFACIAL WITHOUT CONTRAST CT CERVICAL SPINE WITHOUT CONTRAST TECHNIQUE: Multidetector CT imaging of the head, cervical spine, and maxillofacial structures were performed using the standard protocol without intravenous contrast. Multiplanar CT image reconstructions of the cervical spine and maxillofacial structures were also generated. COMPARISON:  CT head 05/04/2019, CTa head neck 07/03/2018, MR cervical spine 06/29/2018, MR head 07/01/2018 FINDINGS: CT HEAD FINDINGS Brain: No evidence of acute infarction, hemorrhage, hydrocephalus, extra-axial collection, visible mass lesion or  mass effect. Basal cisterns are patent. Midline intracranial structures are unremarkable. Cerebellar tonsils are normally position. Benign dural calcifications, stable from priors. Vascular: Atherosclerotic calcification of the carotid siphons and intradural vertebral arteries. No hyperdense vessel. Skull: No significant scalp swelling or hematoma. Stable infiltration/scarring in the right frontal scalp. Benign dermal calcifications. No calvarial fracture or other acute or worrisome lesions of the skull. Other: None. CT MAXILLOFACIAL FINDINGS Osseous: No fracture  of the bony orbits. A minimal age-indeterminate deformity of the right nasal bone. Slightly prominent vascular channel extending along the right maxillary sinus is unchanged from July. No other mid face fractures are seen. The pterygoid plates are intact. No visible or suspected temporal bone fractures. The mandible is intact. 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 No fractured or avulsed teeth. Orbits: Proptotic appearance of the globes. The globes appear otherwise normal and symmetric. No retro septal gas, stranding or hemorrhage. Symmetric appearance of the extraocular musculature and optic nerve sheath complexes. Normal caliber of the superior ophthalmic veins. Sinuses: Mild nodular mural thickening in the ethmoids and right maxillary sinus. Layering fluid in the right maxillary sinus could reflect acute sinusitis or layering hemosinus. Mastoid air cells are predominantly clear. Middle ear cavities are clear. Ossicular chains appear normally configured. Soft tissues: Soft tissue swelling and laceration across the nasal bridge. No other significant soft tissue swelling, gas or foreign body. Diffuse dermal calcifications are similar to prior. CT CERVICAL SPINE FINDINGS Alignment: Stabilization collar is absent at the time of  examination. Straightening and slight reversal the normal cervical lordosis is somewhat more pronounced than on prior exam and may be related to positioning versus muscle spasm. No evidence of traumatic listhesis. No abnormally widened, perched or jumped facets. Normal alignment of the craniocervical and atlantoaxial articulations. Skull base and vertebrae: Photon starvation artifact may limit detection of subtle anomaly below the level of C4. No acute skull base fracture. No vertebral body fracture or height loss. Normal bone mineralization. No worrisome osseous lesions. Multilevel cervical spondylitic changes as below. Additional arthrosis at the atlantodental and basion dens interval. Soft tissues and spinal canal: No pre or paravertebral fluid or swelling. No visible canal hematoma. 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 Disc levels: Multilevel intervertebral disc height loss with spondylitic endplate changes. In combination with ossification of the posterior longitudinal ligament there is at least mild resulting canal stenosis C4-C6. Multilevel uncinate spurring and facet hypertrophic changes are present as well resulting in at most mild to moderate foraminal narrowing bilaterally C5-6, C6-7, right greater than left. Upper chest: No acute abnormality in the upper chest or imaged lung apices. Other: Normal thyroid. Cervical carotid atherosclerosis noted at the right carotid bifurcation and more proximal common carotid. IMPRESSION: 1. No acute intracranial abnormality. No significant scalp swelling or hematoma. 2. Soft tissue swelling and laceration across the nasal bridge with likely acute minimally displaced fracture of the anterior right nasal bone. 3. 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 Likely contributing to the anterior translation of the bilateral temporomandibular joints though could correlate for tenderness. 4. No other acute facial  bone fractures. 5. Layering fluid in the right maxillary sinus could reflect acute sinusitis or layering hemosinus. 6. Mild proptotic appearance of the globes, correlate with clinical findings. 7. No acute cervical spine fracture or traumatic listhesis. 8. Multilevel cervical spondylitic and facet hypertrophic changes as described above. 9. Intracranial and cervical atherosclerosis. Electronically Signed   By: Lovena Le M.D.   On: 10/15/2020 06:09   DG Hand Complete Left  Result Date: 10/15/2020 CLINICAL DATA:  Fall with hand pain EXAM: LEFT HAND - COMPLETE 3+ VIEW COMPARISON:  11/07/2018 FINDINGS: No acute fracture or dislocation. Symmetric to the right is diffuse degenerative marginal spurring especially of the interphalangeal joints but also affecting the carpus and MCP joints. No evidence of foreign body  IMPRESSION: 1. No acute finding. 2. Diffuse degenerative spurring. Electronically Signed   By: Monte Fantasia M.D.   On: 10/15/2020 04:27   DG Hand Complete Right  Result Date: 10/15/2020 CLINICAL DATA:  Arm pain after unwitnessed fall/syncope. EXAM: RIGHT HAND - COMPLETE 3+ VIEW COMPARISON:  None. FINDINGS: Diffuse joint space narrowing and/or marginal spurring of the interphalangeal and metacarpal phalangeal joints. Mild spurring at the carpus and carpometacarpal joints. No evidence of fracture, dislocation, or foreign body. IMPRESSION: 1. No acute finding. 2. Diffuse osteoarthritic spurring. Electronically Signed   By: Monte Fantasia M.D.   On: 10/15/2020 04:26   CT Maxillofacial Wo Contrast  Result Date: 10/15/2020 CLINICAL DATA:  Syncope, fall from seated position, laceration to bridge of nose EXAM: CT HEAD WITHOUT CONTRAST CT MAXILLOFACIAL WITHOUT CONTRAST CT CERVICAL SPINE WITHOUT CONTRAST TECHNIQUE: Multidetector CT imaging of the head, cervical spine, and maxillofacial structures were performed using the standard protocol without intravenous contrast. Multiplanar CT image  reconstructions of the cervical spine and maxillofacial structures were also generated. COMPARISON:  CT head 05/04/2019, CTa head neck 07/03/2018, MR cervical spine 06/29/2018, MR head 07/01/2018 FINDINGS: CT HEAD FINDINGS Brain: No evidence of acute infarction, hemorrhage, hydrocephalus, extra-axial collection, visible mass lesion or mass effect. Basal cisterns are patent. Midline intracranial structures are unremarkable. Cerebellar tonsils are normally position. Benign dural calcifications, stable from priors. Vascular: Atherosclerotic calcification of the carotid siphons and intradural vertebral arteries. No hyperdense vessel. Skull: No significant scalp swelling or hematoma. Stable infiltration/scarring in the right frontal scalp. Benign dermal calcifications. No calvarial fracture or other acute or worrisome lesions of the skull. Other: None. CT MAXILLOFACIAL FINDINGS Osseous: No fracture of the bony orbits. A minimal age-indeterminate deformity of the right nasal bone. Slightly prominent vascular channel extending along the right maxillary sinus is unchanged from July. No other mid face fractures are seen. The pterygoid plates are intact. No visible or suspected temporal bone fractures. The mandible is intact. 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 No fractured or avulsed teeth. Orbits: Proptotic appearance of the globes. The globes appear otherwise normal and symmetric. No retro septal gas, stranding or hemorrhage. Symmetric appearance of the extraocular musculature and optic nerve sheath complexes. Normal caliber of the superior ophthalmic veins. Sinuses: Mild nodular mural thickening in the ethmoids and right maxillary sinus. Layering fluid in the right maxillary sinus could reflect acute sinusitis or layering hemosinus. Mastoid air cells are predominantly clear. Middle ear  cavities are clear. Ossicular chains appear normally configured. Soft tissues: Soft tissue swelling and laceration across the nasal bridge. No other significant soft tissue swelling, gas or foreign body. Diffuse dermal calcifications are similar to prior. CT CERVICAL SPINE FINDINGS Alignment: Stabilization collar is absent at the time of examination. Straightening and slight reversal the normal cervical lordosis is somewhat more pronounced than on prior exam and may be related to positioning versus muscle spasm. No evidence of traumatic listhesis. No abnormally widened, perched or jumped facets. Normal alignment of the craniocervical and atlantoaxial articulations. Skull base and vertebrae: Photon starvation artifact may limit detection of subtle anomaly below the level of C4. No acute skull base fracture. No vertebral body fracture or height loss. Normal bone mineralization. No worrisome osseous lesions. Multilevel cervical spondylitic changes as below. Additional arthrosis at the atlantodental and basion dens interval. Soft tissues and spinal canal: No pre or paravertebral fluid or swelling. No visible canal hematoma. 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 Disc levels: Multilevel intervertebral disc height loss with spondylitic endplate changes. In combination with ossification of the posterior longitudinal ligament there is at least mild resulting canal stenosis C4-C6. Multilevel uncinate spurring and facet hypertrophic changes are present as well resulting in at most mild to moderate foraminal narrowing bilaterally C5-6, C6-7, right greater than left. Upper chest: No acute abnormality in the upper chest or imaged lung apices. Other: Normal thyroid. Cervical carotid atherosclerosis noted at the right carotid bifurcation and more proximal common carotid. IMPRESSION: 1. No acute intracranial abnormality. No significant scalp swelling or hematoma. 2. Soft tissue swelling and  laceration across the nasal bridge with likely acute minimally displaced fracture of the anterior right nasal bone. 3. 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 Likely contributing to the anterior translation of the bilateral temporomandibular joints though could correlate for tenderness. 4. No other acute facial bone fractures. 5. Layering fluid in the right maxillary sinus could reflect acute sinusitis or layering hemosinus. 6. Mild proptotic appearance of the globes, correlate with clinical findings. 7. No acute cervical spine fracture or traumatic listhesis. 8. Multilevel cervical spondylitic and facet hypertrophic changes as described above. 9. Intracranial and cervical atherosclerosis. Electronically Signed   By: Lovena Le M.D.   On: 10/15/2020 06:09    EKG: Independently reviewed. Sinus, no st elevations  Assessment/Plan Syncopal episode w/ fall and laceration to nose     - admit to inpt, tele     - likely multifactorial     - has been hypotensive with PCP and was hypotensive on presentation     - not unreasonable to believe chronic pain meds have exacerbated that hypotension     - check echo, getting fluids, get PT consult; hold BP/HF meds and narcotics for now  AKI     - check renal US     - getting 2L NS bolus in ED; follow  Hx of HTN Hypotension     - holding home BP meds for now d/t hypotension, follow  HLD     - continue statin  Chronic pain     - probably contributing factor to hypotension and syncope; hold for now; carefully resume when appropriate  DM2     - SSI, DM diet, A1c, glucose checks  CAD     - continue statin, ASA; hold BB d/t hypotension, follow  Chronic HFpEF     - holding home meds for now d/t hypotension, AKI     - daily wts, I&O, restrict Na+  Morbid Obesity     - counseled on lifestyle changes     - follow up outpt   DVT prophylaxis: SCDs  Code Status: FULL  Family Communication: None at bedside.   Consults called:  None   Status is: Inpatient  Remains inpatient appropriate because:Inpatient level of care appropriate due to severity of illness   Dispo: The patient is from: Home              Anticipated d/c is to: Home              Anticipated d/c date is: 2 days              Patient currently is not medically stable to d/c.  Jonnie Finner DO [facility]  If 7PM-7AM, please contact night-coverage [email or web address]  10/15/2020, 7:16 AM

## 2020-10-16 DIAGNOSIS — I959 Hypotension, unspecified: Secondary | ICD-10-CM

## 2020-10-16 DIAGNOSIS — N179 Acute kidney failure, unspecified: Principal | ICD-10-CM

## 2020-10-16 LAB — GLUCOSE, CAPILLARY
Glucose-Capillary: 266 mg/dL — ABNORMAL HIGH (ref 70–99)
Glucose-Capillary: 262 mg/dL — ABNORMAL HIGH (ref 70–99)
Glucose-Capillary: 324 mg/dL — ABNORMAL HIGH (ref 70–99)
Glucose-Capillary: 316 mg/dL — ABNORMAL HIGH (ref 70–99)
Glucose-Capillary: 311 mg/dL — ABNORMAL HIGH (ref 70–99)

## 2020-10-16 LAB — BASIC METABOLIC PANEL
Anion gap: 14 (ref 5–15)
BUN: 41 mg/dL — ABNORMAL HIGH (ref 6–20)
CO2: 23 mmol/L (ref 22–32)
Calcium: 10.6 mg/dL — ABNORMAL HIGH (ref 8.9–10.3)
Chloride: 100 mmol/L (ref 98–111)
Creatinine, Ser: 1.63 mg/dL — ABNORMAL HIGH (ref 0.61–1.24)
GFR, Estimated: 49 mL/min — ABNORMAL LOW (ref >60)
Glucose, Bld: 269 mg/dL — ABNORMAL HIGH (ref 70–99)
Potassium: 4.6 mmol/L (ref 3.5–5.1)
Sodium: 137 mmol/L (ref 135–145)

## 2020-10-16 LAB — CBC
HCT: 40.4 % (ref 39.0–52.0)
Hemoglobin: 13.3 g/dL (ref 13.0–17.0)
MCH: 31.4 pg (ref 26.0–34.0)
MCHC: 32.9 g/dL (ref 30.0–36.0)
MCV: 95.3 fL (ref 80.0–100.0)
Platelets: 262 10*3/uL (ref 150–400)
RBC: 4.24 MIL/uL (ref 4.22–5.81)
RDW: 13 % (ref 11.5–15.5)
WBC: 9.9 10*3/uL (ref 4.0–10.5)
nRBC: 0 % (ref 0.0–0.2)

## 2020-10-16 MED ORDER — INSULIN REGULAR HUMAN (CONC) 500 UNIT/ML ~~LOC~~ SOPN
35.0000 [IU] | PEN_INJECTOR | Freq: Three times a day (TID) | SUBCUTANEOUS | Status: DC
  Administered 2020-10-16 – 2020-10-17 (×4): 35 [IU] via SUBCUTANEOUS
  Filled 2020-10-16: qty 3

## 2020-10-16 MED ORDER — ALLOPURINOL 100 MG PO TABS
100.0000 mg | ORAL_TABLET | Freq: Every day | ORAL | Status: DC
  Administered 2020-10-16 – 2020-10-17 (×2): 100 mg via ORAL
  Filled 2020-10-16 (×2): qty 1

## 2020-10-16 MED ORDER — GABAPENTIN 300 MG PO CAPS
300.0000 mg | ORAL_CAPSULE | Freq: Three times a day (TID) | ORAL | Status: DC
  Administered 2020-10-16 – 2020-10-17 (×4): 300 mg via ORAL
  Filled 2020-10-16 (×4): qty 1

## 2020-10-16 MED ORDER — ATORVASTATIN CALCIUM 40 MG PO TABS
40.0000 mg | ORAL_TABLET | Freq: Every day | ORAL | Status: DC
  Administered 2020-10-16: 40 mg via ORAL
  Filled 2020-10-16: qty 1

## 2020-10-16 MED ORDER — TIZANIDINE HCL 4 MG PO TABS: 4.0000 mg | ORAL_TABLET | Freq: Every evening | ORAL | Status: DC | PRN

## 2020-10-16 MED ORDER — OXYCODONE HCL 5 MG PO TABS
10.0000 mg | ORAL_TABLET | ORAL | Status: DC | PRN
  Administered 2020-10-16: 10 mg via ORAL
  Filled 2020-10-16: qty 2

## 2020-10-16 MED ORDER — CARVEDILOL 12.5 MG PO TABS
12.5000 mg | ORAL_TABLET | Freq: Two times a day (BID) | ORAL | Status: DC
  Administered 2020-10-16 – 2020-10-17 (×2): 12.5 mg via ORAL
  Filled 2020-10-16 (×2): qty 1

## 2020-10-16 MED ORDER — ONDANSETRON HCL 4 MG/2ML IJ SOLN
4.0000 mg | Freq: Four times a day (QID) | INTRAMUSCULAR | Status: DC | PRN
  Administered 2020-10-16 (×2): 4 mg via INTRAVENOUS
  Filled 2020-10-16: qty 2

## 2020-10-16 MED ORDER — SODIUM CHLORIDE 0.45 % IV SOLN: INTRAVENOUS | Status: DC

## 2020-10-16 MED ORDER — ASPIRIN 81 MG PO CHEW
81.0000 mg | CHEWABLE_TABLET | Freq: Every day | ORAL | Status: DC
  Administered 2020-10-16 – 2020-10-17 (×2): 81 mg via ORAL
  Filled 2020-10-16 (×2): qty 1

## 2020-10-16 MED ORDER — DULOXETINE HCL 60 MG PO CPEP
60.0000 mg | ORAL_CAPSULE | Freq: Every day | ORAL | Status: DC
  Administered 2020-10-16 – 2020-10-17 (×2): 60 mg via ORAL
  Filled 2020-10-16 (×2): qty 1

## 2020-10-16 NOTE — Plan of Care (Signed)
Progressing

## 2020-10-16 NOTE — Progress Notes (Signed)
[facility] PROGRESS NOTE   [name] [id number] DOB: 04/04/1961 DOA: 10/15/2020  PCP: [name], FNP  Brief History/Interval Summary: 59 y.o. male with medical history significant of DM2, chronic pain, CAD, HFpEF. Presenting after a syncopal episode w/ fall. He reports that earlier yesterday he was feeling "woosy and tired" but generally ok. He went to sleep without issue. He got up this morning to go to the bathroom and fell. He does not remember the entire fall. He knows he fell onto his face. He doesn't think he was out long because the act of hitting his face woke him out. It was difficult for him to get up, so he called EMS for help. They got him and brought him to the ED. He denies any other alleviating or aggravating factors.    ED Course: Laceration to the bridge of the nose was sutured. Found to have AKI and found to be hypotensive. He was given 2L NS bolus. [facility] was called for admission.   Reason for Visit: Syncope.  Acute kidney injury  Consultants: Phone consultation with Dr. Constance Holster with ENT  Procedures: None  Antibiotics: Anti-infectives (From admission, onward)   None      Subjective/Interval History: Patient complains of some nausea this morning.  Also mentions some lightheadedness.  No chest pain.  Tolerated his breakfast.     Assessment/Plan:  Syncope Etiology is unclear.  Patient was apparently sitting at the side of his bed and before he could get up he found himself on the floor.  Very suspicious for arrhythmia.  Patient is on telemetry.  Brief episode of sinus tachycardia noted.  No other arrhythmias noted.  EKG does not show any acute findings.  Echocardiogram has been done and shows normal systolic function.  Right ventricular function is also normal.  No significant valvular abnormalities.  Orthostatics are negative although in the ED yesterday he was noted to be hypotensive.  He had renal failure as well.  Could have been  hypovolemic.  Noted to be on diuretics at home.  Continue to watch for now.  Since he is still symptomatic and cannot ambulate we will keep him here another day.  Acute kidney injury Baseline creatinine as around 0.71.  Presented with creatinine of 3.06.  He was hydrated.  Improved to 1.63 this morning.  BUN is also improved from 65 to 41.  Minimally displaced fracture of the anterior right nasal bone with evidence for possible hemosinus Abnormal findings noted on CT scan of the maxillofacial area.  Findings discussed with Dr. Constance Holster who recommends follow-up in the office in 5 days after swelling has subsided.  Hypotension in the setting of history of essential hypertension Blood pressures in the ED were in the 0000000 systolic.  His antihypertensives were held.  Blood pressure has rebounded.  Orthostatics were checked and patient did not have a significant drop in blood pressure when he stood up.  Continue to watch for now.  May need to resume some of his antihypertensives gradually.  Hyperlipidemia Continue statin.  Chronic pain syndrome Patient on gabapentin, Cymbalta, oxycodone, tizanidine.  Most of these medications were placed on hold.  Patient complaining of pain this morning.  They will be resumed gradually.  Diabetes mellitus type 2 uncontrolled with hyperglycemia HbA1c 10.8.  Patient is on U5 100 insulin at home.  This will be resumed at a lower dose.  Continue sliding scale coverage.  History of coronary artery disease Follows with Dr. Stanford Breed.  No chest pain.  EKG does not show any acute findings.  Continue aspirin and statin.  Resume beta-blocker since blood pressure is now improved.  Chronic diastolic CHF No evidence for volume overload currently if anything he was hypovolemic at the time of admission.  Diuretics on hold currently.  Morbid obesity Estimated body mass index is 45.2 kg/m as calculated from the following:   Height as of this encounter: 5\' 10"  (1.778 m).   Weight  as of this encounter: 142.9 kg.   DVT Prophylaxis: Currently on SCDs.  Code Status: Full code Family Communication: Discussed with the patient Disposition Plan: Hopefully return home in improved  Status is: Inpatient  Remains inpatient appropriate because:IV treatments appropriate due to intensity of illness or inability to take PO and Inpatient level of care appropriate due to severity of illness   Dispo: The patient is from: Home              Anticipated d/c is to: Home              Anticipated d/c date is: 1 day              Patient currently is not medically stable to d/c.      Medications:  Scheduled: . allopurinol  100 mg Oral Daily  . aspirin  81 mg Oral Daily  . atorvastatin  40 mg Oral QHS  . DULoxetine  60 mg Oral Daily  . gabapentin  300 mg Oral TID  . insulin aspart  0-5 Units Subcutaneous QHS  . insulin aspart  0-9 Units Subcutaneous TID WC  . insulin regular human CONCENTRATED  35 Units Subcutaneous TID WC  . sodium chloride flush  3 mL Intravenous Q12H   Continuous:  [id number] (ZOFRAN) IV, oxyCODONE, tiZANidine   Objective:  Vital Signs  Vitals:   10/15/20 2200 10/16/20 0651 10/16/20 1000 10/16/20 1001  BP: 135/64 (!) 166/73 140/74 (!) 148/93  Pulse: 94 84 90 (!) 106  Resp: 20 18    Temp: 99.3 F (37.4 C) 98.9 F (37.2 C)    TempSrc: Oral Oral    SpO2: 96% 97%    Weight:      Height:        Intake/Output Summary (Last 24 hours) at 10/16/2020 1254 Last data filed at 10/16/2020 [id number] Gross per 24 hour  Intake 1320 ml  Output --  Net 1320 ml   Filed Weights   10/15/20 0324  Weight: (!) 142.9 kg    General appearance: Awake alert.  In no distress Resp: Clear to auscultation bilaterally.  Normal effort Cardio: S1-S2 is normal regular.  No S3-S4.  No rubs murmurs or bruit GI: Abdomen is soft.  Nontender nondistended.  Bowel sounds are present normal.  No masses organomegaly Extremities: No edema.  Full range of motion of lower  extremities. Neurologic: Alert and oriented x3.  No focal neurological deficits.    Lab Results:  Data Reviewed: I have personally reviewed following labs and imaging studies  CBC: Recent Labs  Lab 10/15/20 0359 10/16/20 0742  WBC 9.3 9.9  HGB 12.0* 13.3  HCT 36.1* 40.4  MCV 93.0 95.3  PLT 232 [id number]    Basic Metabolic Panel: Recent Labs  Lab 10/15/20 0359 10/16/20 0742  NA 138 137  K 4.5 4.6  CL 100 100  CO2 23 23  GLUCOSE 156* 269*  BUN 65* 41*  CREATININE 3.06* 1.63*  CALCIUM 9.3 10.6*    GFR: Estimated Creatinine Clearance: 71.4 mL/min (A) (by C-G formula  based on SCr of 1.63 mg/dL (H)).   HbA1C: Recent Labs    10/15/20 1217  HGBA1C 10.8*    CBG: Recent Labs  Lab 10/15/20 1659 10/15/20 2155 10/16/20 0645 10/16/20 0744 10/16/20 1055  GLUCAP 343* 330* 266* 262* 324*      Recent Results (from the past 240 hour(s))  Resp Panel by RT-PCR (Flu A&B, Covid) Nasopharyngeal Swab     Status: None   Collection Time: 10/15/20  7:34 AM   Specimen: Nasopharyngeal Swab; Nasopharyngeal(NP) swabs in vial transport medium  Result Value Ref Range Status   SARS Coronavirus 2 by RT PCR NEGATIVE NEGATIVE Final    Comment: (NOTE) SARS-CoV-2 target nucleic acids are NOT DETECTED.  The SARS-CoV-2 RNA is generally detectable in upper respiratory specimens during the acute phase of infection. The lowest concentration of SARS-CoV-2 viral copies this assay can detect is 138 copies/mL. A negative result does not preclude SARS-Cov-2 infection and should not be used as the sole basis for treatment or other patient management decisions. A negative result may occur with  improper specimen collection/handling, submission of specimen other than nasopharyngeal swab, presence of viral mutation(s) within the areas targeted by this assay, and inadequate number of viral copies(<138 copies/mL). A negative result must be combined with clinical observations, patient history, and  epidemiological information. The expected result is Negative.  Fact Sheet for Patients:  EntrepreneurPulse.com.au  Fact Sheet for Healthcare Providers:  IncredibleEmployment.be  This test is no t yet approved or cleared by the [place] FDA and  has been authorized for detection and/or diagnosis of SARS-CoV-2 by FDA under an Emergency Use Authorization (EUA). This EUA will remain  in effect (meaning this test can be used) for the duration of the COVID-19 declaration under Section 564(b)(1) of the Act, 21 U.S.C.section 360bbb-3(b)(1), unless the authorization is terminated  or revoked sooner.       Influenza A by PCR NEGATIVE NEGATIVE Final   Influenza B by PCR NEGATIVE NEGATIVE Final    Comment: (NOTE) The Xpert Xpress SARS-CoV-2/FLU/RSV plus assay is intended as an aid in the diagnosis of influenza from Nasopharyngeal swab specimens and should not be used as a sole basis for treatment. Nasal washings and aspirates are unacceptable for Xpert Xpress SARS-CoV-2/FLU/RSV testing.  Fact Sheet for Patients: EntrepreneurPulse.com.au  Fact Sheet for Healthcare Providers: IncredibleEmployment.be  This test is not yet approved or cleared by the [place] FDA and has been authorized for detection and/or diagnosis of SARS-CoV-2 by FDA under an Emergency Use Authorization (EUA). This EUA will remain in effect (meaning this test can be used) for the duration of the COVID-19 declaration under Section 564(b)(1) of the Act, 21 U.S.C. section 360bbb-3(b)(1), unless the authorization is terminated or revoked.  Performed at [facility], [place] [address] [place], [place] [address]       Radiology Studies: DG Forearm Right  Result Date: 10/15/2020 CLINICAL DATA:  Hand and arm pain after unwitnessed fall EXAM: RIGHT FOREARM - 2 VIEW COMPARISON:  None. FINDINGS: No evidence of forearm fracture  or subluxation. Enthesophytes are present in the supracondylar humerus with mild marginal spurring at the proximal radius and ulna. Cluster of soft tissue calcifications in the ventral and proximal forearm which are rounded and usually seen with phleboliths. No visible superimposed soft tissue mass. IMPRESSION: No acute finding. Electronically Signed   By: Monte Fantasia M.D.   On: 10/15/2020 04:28   CT Head Wo Contrast  Result Date: 10/15/2020 CLINICAL DATA:  Syncope, fall  from seated position, laceration to bridge of nose EXAM: CT HEAD WITHOUT CONTRAST CT MAXILLOFACIAL WITHOUT CONTRAST CT CERVICAL SPINE WITHOUT CONTRAST TECHNIQUE: Multidetector CT imaging of the head, cervical spine, and maxillofacial structures were performed using the standard protocol without intravenous contrast. Multiplanar CT image reconstructions of the cervical spine and maxillofacial structures were also generated. COMPARISON:  CT head 05/04/2019, CTa head neck 07/03/2018, MR cervical spine 06/29/2018, MR head 07/01/2018 FINDINGS: CT HEAD FINDINGS Brain: No evidence of acute infarction, hemorrhage, hydrocephalus, extra-axial collection, visible mass lesion or mass effect. Basal cisterns are patent. Midline intracranial structures are unremarkable. Cerebellar tonsils are normally position. Benign dural calcifications, stable from priors. Vascular: Atherosclerotic calcification of the carotid siphons and intradural vertebral arteries. No hyperdense vessel. Skull: No significant scalp swelling or hematoma. Stable infiltration/scarring in the right frontal scalp. Benign dermal calcifications. No calvarial fracture or other acute or worrisome lesions of the skull. Other: None. CT MAXILLOFACIAL FINDINGS Osseous: No fracture of the bony orbits. A minimal age-indeterminate deformity of the right nasal bone. Slightly prominent vascular channel extending along the right maxillary sinus is unchanged from July. No other mid face fractures are  seen. The pterygoid plates are intact. No visible or suspected temporal bone fractures. The mandible is intact. 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 No fractured or avulsed teeth. Orbits: Proptotic appearance of the globes. The globes appear otherwise normal and symmetric. No retro septal gas, stranding or hemorrhage. Symmetric appearance of the extraocular musculature and optic nerve sheath complexes. Normal caliber of the superior ophthalmic veins. Sinuses: Mild nodular mural thickening in the ethmoids and right maxillary sinus. Layering fluid in the right maxillary sinus could reflect acute sinusitis or layering hemosinus. Mastoid air cells are predominantly clear. Middle ear cavities are clear. Ossicular chains appear normally configured. Soft tissues: Soft tissue swelling and laceration across the nasal bridge. No other significant soft tissue swelling, gas or foreign body. Diffuse dermal calcifications are similar to prior. CT CERVICAL SPINE FINDINGS Alignment: Stabilization collar is absent at the time of examination. Straightening and slight reversal the normal cervical lordosis is somewhat more pronounced than on prior exam and may be related to positioning versus muscle spasm. No evidence of traumatic listhesis. No abnormally widened, perched or jumped facets. Normal alignment of the craniocervical and atlantoaxial articulations. Skull base and vertebrae: Photon starvation artifact may limit detection of subtle anomaly below the level of C4. No acute skull base fracture. No vertebral body fracture or height loss. Normal bone mineralization. No worrisome osseous lesions. Multilevel cervical spondylitic changes as below. Additional arthrosis at the atlantodental and basion dens interval. Soft tissues and spinal canal: No pre or paravertebral fluid or swelling. No visible  canal hematoma. 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 Disc levels: Multilevel intervertebral disc height loss with spondylitic endplate changes. In combination with ossification of the posterior longitudinal ligament there is at least mild resulting canal stenosis C4-C6. Multilevel uncinate spurring and facet hypertrophic changes are present as well resulting in at most mild to moderate foraminal narrowing bilaterally C5-6, C6-7, right greater than left. Upper chest: No acute abnormality in the upper chest or imaged lung apices. Other: Normal thyroid. Cervical carotid atherosclerosis noted at the right carotid bifurcation and more proximal common carotid. IMPRESSION: 1. No acute intracranial abnormality. No significant scalp swelling or hematoma. 2. Soft tissue swelling and laceration across the nasal bridge with likely acute minimally  displaced fracture of the anterior right nasal bone. 3. 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 Likely contributing to the anterior translation of the bilateral temporomandibular joints though could correlate for tenderness. 4. No other acute facial bone fractures. 5. Layering fluid in the right maxillary sinus could reflect acute sinusitis or layering hemosinus. 6. Mild proptotic appearance of the globes, correlate with clinical findings. 7. No acute cervical spine fracture or traumatic listhesis. 8. Multilevel cervical spondylitic and facet hypertrophic changes as described above. 9. Intracranial and cervical atherosclerosis. Electronically Signed   By: Lovena Le M.D.   On: 10/15/2020 06:09   CT CERVICAL SPINE WO CONTRAST  Result Date: 10/15/2020 CLINICAL DATA:  Syncope, fall from seated position, laceration to bridge of nose EXAM: CT HEAD WITHOUT CONTRAST CT MAXILLOFACIAL WITHOUT CONTRAST CT CERVICAL SPINE WITHOUT CONTRAST TECHNIQUE: Multidetector CT imaging of the head, cervical spine, and maxillofacial  structures were performed using the standard protocol without intravenous contrast. Multiplanar CT image reconstructions of the cervical spine and maxillofacial structures were also generated. COMPARISON:  CT head 05/04/2019, CTa head neck 07/03/2018, MR cervical spine 06/29/2018, MR head 07/01/2018 FINDINGS: CT HEAD FINDINGS Brain: No evidence of acute infarction, hemorrhage, hydrocephalus, extra-axial collection, visible mass lesion or mass effect. Basal cisterns are patent. Midline intracranial structures are unremarkable. Cerebellar tonsils are normally position. Benign dural calcifications, stable from priors. Vascular: Atherosclerotic calcification of the carotid siphons and intradural vertebral arteries. No hyperdense vessel. Skull: No significant scalp swelling or hematoma. Stable infiltration/scarring in the right frontal scalp. Benign dermal calcifications. No calvarial fracture or other acute or worrisome lesions of the skull. Other: None. CT MAXILLOFACIAL FINDINGS Osseous: No fracture of the bony orbits. A minimal age-indeterminate deformity of the right nasal bone. Slightly prominent vascular channel extending along the right maxillary sinus is unchanged from July. No other mid face fractures are seen. The pterygoid plates are intact. No visible or suspected temporal bone fractures. The mandible is intact. 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 No fractured or avulsed teeth. Orbits: Proptotic appearance of the globes. The globes appear otherwise normal and symmetric. No retro septal gas, stranding or hemorrhage. Symmetric appearance of the extraocular musculature and optic nerve sheath complexes. Normal caliber of the superior ophthalmic veins. Sinuses: Mild nodular mural thickening in the ethmoids and right maxillary sinus. Layering fluid in the right maxillary sinus  could reflect acute sinusitis or layering hemosinus. Mastoid air cells are predominantly clear. Middle ear cavities are clear. Ossicular chains appear normally configured. Soft tissues: Soft tissue swelling and laceration across the nasal bridge. No other significant soft tissue swelling, gas or foreign body. Diffuse dermal calcifications are similar to prior. CT CERVICAL SPINE FINDINGS Alignment: Stabilization collar is absent at the time of examination. Straightening and slight reversal the normal cervical lordosis is somewhat more pronounced than on prior exam and may be related to positioning versus muscle spasm. No evidence of traumatic listhesis. No abnormally widened, perched or jumped facets. Normal alignment of the craniocervical and atlantoaxial articulations. Skull base and vertebrae: Photon starvation artifact may limit detection of subtle anomaly below the level of C4. No acute skull base fracture. No vertebral body fracture or height loss. Normal bone mineralization. No worrisome osseous lesions. Multilevel cervical spondylitic changes as below. Additional arthrosis at the atlantodental and basion dens interval. Soft tissues and spinal canal: No pre or paravertebral fluid or swelling. No visible canal hematoma. 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 Disc levels: Multilevel intervertebral disc height loss with spondylitic endplate changes. In combination with ossification of the posterior longitudinal ligament there is at least mild resulting canal stenosis C4-C6. Multilevel uncinate spurring and facet hypertrophic changes are present as well resulting in at most mild to moderate foraminal narrowing bilaterally C5-6, C6-7, right greater than left. Upper chest: No acute abnormality in the upper chest or imaged lung apices. Other: Normal thyroid. Cervical carotid atherosclerosis noted at the right carotid bifurcation and more proximal common carotid. IMPRESSION: 1.  No acute intracranial abnormality. No significant scalp swelling or hematoma. 2. Soft tissue swelling and laceration across the nasal bridge with likely acute minimally displaced fracture of the anterior right nasal bone. 3. 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 Likely contributing to the anterior translation of the bilateral temporomandibular joints though could correlate for tenderness. 4. No other acute facial bone fractures. 5. Layering fluid in the right maxillary sinus could reflect acute sinusitis or layering hemosinus. 6. Mild proptotic appearance of the globes, correlate with clinical findings. 7. No acute cervical spine fracture or traumatic listhesis. 8. Multilevel cervical spondylitic and facet hypertrophic changes as described above. 9. Intracranial and cervical atherosclerosis. Electronically Signed   By: Lovena Le M.D.   On: 10/15/2020 06:09   US RENAL  Result Date: 10/15/2020 CLINICAL DATA:  Acute kidney injury. Type 2 diabetes and hypertension. EXAM: RENAL / URINARY TRACT ULTRASOUND COMPLETE COMPARISON:  Abdominopelvic CT 04/27/2020. FINDINGS: Right Kidney: Renal measurements: 13.5 x 5.5 x 5.7 cm = volume: 218.0 mL. Echogenicity within normal limits. No mass or hydronephrosis visualized. Left Kidney: Renal measurements: 12.0 x 6.0 x 6.5 cm = volume: 244.0 mL. Echogenicity within normal limits. No mass or hydronephrosis visualized. Visualization of the left kidney is suboptimal due to bowel gas and body habitus, especially in the upper pole. Bladder: Decompressed and not visualized. Other: Increased hepatic echogenicity, likely due to steatosis. IMPRESSION: 1. Both kidneys are normal in size without hydronephrosis. Suboptimal visualization of the left kidney. 2. Probable hepatic steatosis. Electronically Signed   By: Richardean Sale M.D.   On: 10/15/2020 11:40   DG Hand Complete Left  Result Date: 10/15/2020 CLINICAL DATA:  Fall with hand pain EXAM: LEFT HAND  - COMPLETE 3+ VIEW COMPARISON:  11/07/2018 FINDINGS: No acute fracture or dislocation. Symmetric to the right is diffuse degenerative marginal spurring especially of the interphalangeal joints but also affecting the carpus and MCP joints. No evidence of foreign body IMPRESSION: 1. No acute finding. 2. Diffuse degenerative spurring. Electronically Signed   By: Monte Fantasia M.D.   On: 10/15/2020 04:27   DG Hand Complete Right  Result Date: 10/15/2020 CLINICAL DATA:  Arm pain after unwitnessed fall/syncope. EXAM: RIGHT HAND - COMPLETE 3+ VIEW COMPARISON:  None. FINDINGS: Diffuse joint space narrowing and/or marginal spurring of the interphalangeal and metacarpal phalangeal joints. Mild spurring at the carpus and carpometacarpal joints. No evidence of fracture, dislocation, or foreign body. IMPRESSION: 1. No acute finding. 2. Diffuse osteoarthritic spurring. Electronically Signed   By: Monte Fantasia M.D.   On: 10/15/2020 04:26   ECHOCARDIOGRAM COMPLETE  Result Date: 10/15/2020    ECHOCARDIOGRAM REPORT   Patient Name:   [name] Date of Exam: 10/15/2020 Medical Rec #:  [id number]        Height:       70.0 in Accession #:    [id number]       Weight:  315.0 lb Date of Birth:  1961-10-18       BSA:          2.532 m Patient Age:    6 years         BP:           120/63 mmHg Patient Gender: M                HR:           70 bpm. Exam Location:  Inpatient Procedure: 2D Echo, Cardiac Doppler and Color Doppler Indications:    R55 Syncope  History:        Patient has prior history of Echocardiogram examinations, most                 recent 04/22/2019. CHF, CAD, Stroke; Risk Factors:Diabetes,                 Hypertension and Dyslipidemia.  Sonographer:    Bernadene Person RDCS Referring Phys: [id number] [place]  1. Left ventricular ejection fraction, by estimation, is 55 to 60%. The left ventricle has normal function. The left ventricle has no regional wall motion abnormalities. The left  ventricular internal cavity size was mildly dilated. Left ventricular diastolic parameters were normal.  2. Right ventricular systolic function is normal. The right ventricular size is normal. There is normal pulmonary artery systolic pressure.  3. The mitral valve is normal in structure. Trivial mitral valve regurgitation. No evidence of mitral stenosis.  4. The aortic valve is tricuspid. Aortic valve regurgitation is not visualized. No aortic stenosis is present.  5. The inferior vena cava is dilated in size with >50% respiratory variability, suggesting right atrial pressure of 8 mmHg. FINDINGS  Left Ventricle: Left ventricular ejection fraction, by estimation, is 55 to 60%. The left ventricle has normal function. The left ventricle has no regional wall motion abnormalities. The left ventricular internal cavity size was mildly dilated. There is  no left ventricular hypertrophy. Left ventricular diastolic parameters were normal. Indeterminate filling pressures. Right Ventricle: The right ventricular size is normal. No increase in right ventricular wall thickness. Right ventricular systolic function is normal. There is normal pulmonary artery systolic pressure. The tricuspid regurgitant velocity is 1.82 m/s, and  with an assumed right atrial pressure of 8 mmHg, the estimated right ventricular systolic pressure is 0000000 mmHg. Left Atrium: Left atrial size was normal in size. Right Atrium: Right atrial size was normal in size. Pericardium: There is no evidence of pericardial effusion. Mitral Valve: The mitral valve is normal in structure. Trivial mitral valve regurgitation. No evidence of mitral valve stenosis. Tricuspid Valve: The tricuspid valve is normal in structure. Tricuspid valve regurgitation is trivial. No evidence of tricuspid stenosis. Aortic Valve: The aortic valve is tricuspid. Aortic valve regurgitation is not visualized. No aortic stenosis is present. Pulmonic Valve: The pulmonic valve was normal in  structure. Pulmonic valve regurgitation is not visualized. No evidence of pulmonic stenosis. Aorta: The aortic root is normal in size and structure. Venous: The inferior vena cava is dilated in size with greater than 50% respiratory variability, suggesting right atrial pressure of 8 mmHg. IAS/Shunts: No atrial level shunt detected by color flow Doppler.  LEFT VENTRICLE PLAX 2D LVIDd:         5.70 cm      Diastology LVIDs:         4.40 cm      LV e' medial:    7.62 cm/s LV PW:  1.00 cm      LV E/e' medial:  11.6 LV IVS:        0.80 cm      LV e' lateral:   9.90 cm/s LVOT diam:     2.10 cm      LV E/e' lateral: 9.0 LV SV:         95 LV SV Index:   38 LVOT Area:     3.46 cm  LV Volumes (MOD) LV vol d, MOD A2C: 130.0 ml LV vol d, MOD A4C: 101.0 ml LV vol s, MOD A2C: 46.5 ml LV vol s, MOD A4C: 49.7 ml LV SV MOD A2C:     83.5 ml LV SV MOD A4C:     101.0 ml LV SV MOD BP:      74.3 ml RIGHT VENTRICLE RV S prime:     20.50 cm/s TAPSE (M-mode): 2.8 cm LEFT ATRIUM             Index       RIGHT ATRIUM           Index LA diam:        3.50 cm 1.38 cm/m  RA Area:     15.00 cm LA Vol (A2C):   51.9 ml 20.50 ml/m RA Volume:   34.80 ml  13.75 ml/m LA Vol (A4C):   51.3 ml 20.26 ml/m LA Biplane Vol: 55.0 ml 21.72 ml/m  AORTIC VALVE LVOT Vmax:   162.00 cm/s LVOT Vmean:  107.000 cm/s LVOT VTI:    0.275 m  AORTA Ao Root diam: 3.40 cm Ao Asc diam:  3.10 cm MITRAL VALVE               TRICUSPID VALVE MV Area (PHT): 2.73 cm    TR Peak grad:   13.2 mmHg MV Decel Time: 278 msec    TR Vmax:        182.00 cm/s MV E velocity: 88.70 cm/s MV A velocity: 76.30 cm/s  SHUNTS MV E/A ratio:  1.16        Systemic VTI:  0.28 m                            Systemic Diam: 2.10 cm Skeet Latch MD Electronically signed by Skeet Latch MD Signature Date/Time: 10/15/2020/12:41:49 PM    Final    CT Maxillofacial Wo Contrast  Result Date: 10/15/2020 CLINICAL DATA:  Syncope, fall from seated position, laceration to bridge of nose EXAM: CT  HEAD WITHOUT CONTRAST CT MAXILLOFACIAL WITHOUT CONTRAST CT CERVICAL SPINE WITHOUT CONTRAST TECHNIQUE: Multidetector CT imaging of the head, cervical spine, and maxillofacial structures were performed using the standard protocol without intravenous contrast. Multiplanar CT image reconstructions of the cervical spine and maxillofacial structures were also generated. COMPARISON:  CT head 05/04/2019, CTa head neck 07/03/2018, MR cervical spine 06/29/2018, MR head 07/01/2018 FINDINGS: CT HEAD FINDINGS Brain: No evidence of acute infarction, hemorrhage, hydrocephalus, extra-axial collection, visible mass lesion or mass effect. Basal cisterns are patent. Midline intracranial structures are unremarkable. Cerebellar tonsils are normally position. Benign dural calcifications, stable from priors. Vascular: Atherosclerotic calcification of the carotid siphons and intradural vertebral arteries. No hyperdense vessel. Skull: No significant scalp swelling or hematoma. Stable infiltration/scarring in the right frontal scalp. Benign dermal calcifications. No calvarial fracture or other acute or worrisome lesions of the skull. Other: None. CT MAXILLOFACIAL FINDINGS Osseous: No fracture of the bony orbits. A minimal age-indeterminate deformity of the right  nasal bone. Slightly prominent vascular channel extending along the right maxillary sinus is unchanged from July. No other mid face fractures are seen. The pterygoid plates are intact. No visible or suspected temporal bone fractures. The mandible is intact. 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 No fractured or avulsed teeth. Orbits: Proptotic appearance of the globes. The globes appear otherwise normal and symmetric. No retro septal gas, stranding or hemorrhage. Symmetric appearance of the extraocular musculature and optic nerve sheath complexes.  Normal caliber of the superior ophthalmic veins. Sinuses: Mild nodular mural thickening in the ethmoids and right maxillary sinus. Layering fluid in the right maxillary sinus could reflect acute sinusitis or layering hemosinus. Mastoid air cells are predominantly clear. Middle ear cavities are clear. Ossicular chains appear normally configured. Soft tissues: Soft tissue swelling and laceration across the nasal bridge. No other significant soft tissue swelling, gas or foreign body. Diffuse dermal calcifications are similar to prior. CT CERVICAL SPINE FINDINGS Alignment: Stabilization collar is absent at the time of examination. Straightening and slight reversal the normal cervical lordosis is somewhat more pronounced than on prior exam and may be related to positioning versus muscle spasm. No evidence of traumatic listhesis. No abnormally widened, perched or jumped facets. Normal alignment of the craniocervical and atlantoaxial articulations. Skull base and vertebrae: Photon starvation artifact may limit detection of subtle anomaly below the level of C4. No acute skull base fracture. No vertebral body fracture or height loss. Normal bone mineralization. No worrisome osseous lesions. Multilevel cervical spondylitic changes as below. Additional arthrosis at the atlantodental and basion dens interval. Soft tissues and spinal canal: No pre or paravertebral fluid or swelling. No visible canal hematoma. 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 Disc levels: Multilevel intervertebral disc height loss with spondylitic endplate changes. In combination with ossification of the posterior longitudinal ligament there is at least mild resulting canal stenosis C4-C6. Multilevel uncinate spurring and facet hypertrophic changes are present as well resulting in at most mild to moderate foraminal narrowing bilaterally C5-6, C6-7, right greater than left. Upper chest: No acute abnormality in the  upper chest or imaged lung apices. Other: Normal thyroid. Cervical carotid atherosclerosis noted at the right carotid bifurcation and more proximal common carotid. IMPRESSION: 1. No acute intracranial abnormality. No significant scalp swelling or hematoma. 2. Soft tissue swelling and laceration across the nasal bridge with likely acute minimally displaced fracture of the anterior right nasal bone. 3. 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 Likely contributing to the anterior translation of the bilateral temporomandibular joints though could correlate for tenderness. 4. No other acute facial bone fractures. 5. Layering fluid in the right maxillary sinus could reflect acute sinusitis or layering hemosinus. 6. Mild proptotic appearance of the globes, correlate with clinical findings. 7. No acute cervical spine fracture or traumatic listhesis. 8. Multilevel cervical spondylitic and facet hypertrophic changes as described above. 9. Intracranial and cervical atherosclerosis. Electronically Signed   By: Lovena Le M.D.   On: 10/15/2020 06:09       LOS: 1 day   [facility] Pager on [email or web address]  10/16/2020, 12:54 PM

## 2020-10-16 NOTE — Progress Notes (Signed)
Inpatient Diabetes Program Recommendations  AACE/ADA: New Consensus Statement on Inpatient Glycemic Control (2015)  Target Ranges:  Prepandial:   less than 140 mg/dL      Peak postprandial:   less than 180 mg/dL (1-2 hours)      Critically ill patients:  140 - 180 mg/dL   Lab Results  Component Value Date   GLUCAP 262 (H) 10/16/2020   HGBA1C 10.8 (H) 10/15/2020    Review of Glycemic Control Results for GATLYN, LIPARI (MRN [id number]) as of 10/16/2020 08:14  Ref. Range 10/15/2020 16:59 10/15/2020 21:55 10/16/2020 06:45 10/16/2020 07:44  Glucose-Capillary Latest Ref Range: 70 - 99 mg/dL 343 (H) 330 (H) 266 (H) 262 (H)   Diabetes history: DM 2 Outpatient Diabetes medications:  U500 insulin-65 units with breakfast, 70 units at lunch, and 75 units at bedtime Current orders for Inpatient glycemic control:  Novolog sensitive tid with meals and HS  Inpatient Diabetes Program Recommendations:    A1C is consistent from September 2021.   Recommend restarting U500 insulin 35 units tid with meals while in the hospital.  Thanks,  Adah Perl, RN, BC-ADM Inpatient Diabetes Coordinator Pager [phone] (8a-5p)

## 2020-10-16 NOTE — Evaluation (Signed)
Physical Therapy One Time Evaluation Patient Details Name: [name] MRN: [id number] DOB: 1960-11-21 Today's Date: 10/16/2020   History of Present Illness  59 y.o. male with medical history significant of DM2, chronic pain, CAD, HFpEF, cervical radiculopathy, back pain. Presenting after a syncopal episode w/ fall.  Clinical Impression  Patient evaluated by Physical Therapy with no further acute PT needs identified. All education has been completed and the patient has no further questions.  See below for any follow-up Physical Therapy or equipment needs. PT is signing off. Thank you for this referral.  Pt ambulate around entire unit and denies any dizziness or symptoms.       Follow Up Recommendations No PT follow up    Equipment Recommendations  None recommended by PT    Recommendations for Other Services       Precautions / Restrictions Precautions Precautions: None Restrictions Weight Bearing Restrictions: No      Mobility  Bed Mobility               General bed mobility comments: pt sitting in recliner on arrival    Transfers Overall transfer level: Modified independent                  Ambulation/Gait Ambulation/Gait assistance: Supervision Gait Distance (Feet): 400 Feet Assistive device: None       General Gait Details: increased bilateral sway likely due to body habitus, no unsteadiness or LOB observed, denies dizziness; HR 116 bpm and RR 35 during ambulation  Stairs            Wheelchair Mobility    Modified Rankin (Stroke Patients Only)       Balance Overall balance assessment: No apparent balance deficits (not formally assessed)                                           Pertinent Vitals/Pain Pain Assessment: Faces Faces Pain Scale: Hurts little more Pain Location: hip and back (chronic) Pain Descriptors / Indicators: Sore;Aching Pain Intervention(s): Repositioned;Monitored during  session;Premedicated before session    Home Living Family/patient expects to be discharged to:: Private residence Living Arrangements: Alone   Type of Home: Apartment Home Access: Level entry     Home Layout: One level Home Equipment: None      Prior Function Level of Independence: Independent               Hand Dominance        Extremity/Trunk Assessment        Lower Extremity Assessment Lower Extremity Assessment: Overall WFL for tasks assessed    Cervical / Trunk Assessment Cervical / Trunk Assessment: Normal  Communication   Communication: No difficulties  Cognition Arousal/Alertness: Awake/alert Behavior During Therapy: WFL for tasks assessed/performed Overall Cognitive Status: Within Functional Limits for tasks assessed                                        General Comments      Exercises     Assessment/Plan    PT Assessment Patent does not need any further PT services  PT Problem List         PT Treatment Interventions      PT Goals (Current goals can be found in the Care Plan section)  Acute  Rehab PT Goals PT Goal Formulation: All assessment and education complete, DC therapy    Frequency     Barriers to discharge        Co-evaluation               AM-PAC PT "6 Clicks" Mobility  Outcome Measure Help needed turning from your back to your side while in a flat bed without using bedrails?: None Help needed moving from lying on your back to sitting on the side of a flat bed without using bedrails?: None Help needed moving to and from a bed to a chair (including a wheelchair)?: None Help needed standing up from a chair using your arms (e.g., wheelchair or bedside chair)?: None Help needed to walk in hospital room?: None Help needed climbing 3-5 steps with a railing? : A Little 6 Click Score: 23    End of Session   Activity Tolerance: Patient tolerated treatment well Patient left: in chair;with call  bell/phone within reach Nurse Communication: Mobility status PT Visit Diagnosis: Difficulty in walking, not elsewhere classified (R26.2)    Time: 9038-3338 PT Time Calculation (min) (ACUTE ONLY): 9 min   Charges:   PT Evaluation $PT Eval Low Complexity: 1 Low     Kati PT, DPT Acute Rehabilitation Services Pager: [phone] Office: [phone]  York Ram E 10/16/2020, 2:43 PM

## 2020-10-16 NOTE — Progress Notes (Signed)
PT Cancellation Note  Patient Details Name: [name] MRN: [id number] DOB: Feb 06, 1961   Cancelled Treatment:    Reason Eval/Treat Not Completed: Other (comment) Pt with nausea, RN to give Zofran.  Will check back as schedule permits.   Niani Mourer,KATHrine E 10/16/2020, 11:04 AM Arlyce Dice, DPT Acute Rehabilitation Services Pager: [phone] Office: [phone]

## 2020-10-17 LAB — BASIC METABOLIC PANEL
Anion gap: 14 (ref 5–15)
BUN: 34 mg/dL — ABNORMAL HIGH (ref 6–20)
CO2: 23 mmol/L (ref 22–32)
Calcium: 10.4 mg/dL — ABNORMAL HIGH (ref 8.9–10.3)
Chloride: 97 mmol/L — ABNORMAL LOW (ref 98–111)
Creatinine, Ser: 1.44 mg/dL — ABNORMAL HIGH (ref 0.61–1.24)
GFR, Estimated: 57 mL/min — ABNORMAL LOW (ref >60)
Glucose, Bld: 279 mg/dL — ABNORMAL HIGH (ref 70–99)
Potassium: 4.1 mmol/L (ref 3.5–5.1)
Sodium: 134 mmol/L — ABNORMAL LOW (ref 135–145)

## 2020-10-17 LAB — CBC
HCT: 37.9 % — ABNORMAL LOW (ref 39.0–52.0)
Hemoglobin: 12.5 g/dL — ABNORMAL LOW (ref 13.0–17.0)
MCH: 30.9 pg (ref 26.0–34.0)
MCHC: 33 g/dL (ref 30.0–36.0)
MCV: 93.8 fL (ref 80.0–100.0)
Platelets: 262 10*3/uL (ref 150–400)
RBC: 4.04 MIL/uL — ABNORMAL LOW (ref 4.22–5.81)
RDW: 12.5 % (ref 11.5–15.5)
WBC: 9.7 10*3/uL (ref 4.0–10.5)
nRBC: 0 % (ref 0.0–0.2)

## 2020-10-17 LAB — GLUCOSE, CAPILLARY
Glucose-Capillary: 254 mg/dL — ABNORMAL HIGH (ref 70–99)
Glucose-Capillary: 291 mg/dL — ABNORMAL HIGH (ref 70–99)
Glucose-Capillary: 267 mg/dL — ABNORMAL HIGH (ref 70–99)

## 2020-10-17 MED ORDER — FUROSEMIDE 40 MG PO TABS: 40.0000 mg | ORAL_TABLET | Freq: Every day | ORAL | 0 refills | Status: DC

## 2020-10-17 NOTE — Discharge Instructions (Signed)
Nasal Fracture A fracture is a break in a bone. A nasal fracture is a broken nose. Minor breaks do not need treatment. They often heal on their own in about one month. Serious breaks may need treatment. Sometimes surgery is needed. What are the causes? This condition is usually caused by a direct hit to the nose (blunt injury). This often occurs from:  Playing a contact sport.  Being in a car accident.  Falling.  Getting punched. What are the signs or symptoms?  Pain.  Swelling of the nose.  Bleeding from the nose.  Bruising around the nose or bruising around the eyes (black eyes).  The nose having a crooked shape. How is this treated? Treatment depends on how bad the injury is.  Minor breaks often do not need treatment.  For more serious breaks that have caused bones to move out of position, treatment may involve one of these: ? Moving the bones back into position without surgery. Your doctor may be able to do this in his or her office after you are given medicine to numb the nose area (local anesthetic). ? Surgery. If needed, this will be done after the swelling is gone. Follow these instructions at home: Activity  Return to your normal activities as told by your doctor. Ask your doctor what activities are safe for you.  Do not play contact sports for 3-4 weeks or as told by your doctor. General instructions      If told, put ice on the injured area: ? Put ice in a plastic bag. ? Place a towel between your skin and the bag. ? Leave the ice on for 20 minutes, 2-3 times a day.  Take over-the-counter and prescription medicines only as told by your doctor.  If your nose bleeds, sit up while you gently squeeze your nose shut for 10 minutes.  Try to not blow your nose.  Keep all follow-up visits as told by your doctor. This is important. Contact a doctor if:  You have more pain or very bad pain.  You keep having nosebleeds.  The shape of your nose does not  return to normal after 5 days.  You have pus coming out of your nose. Get help right away if:  Your nose bleeds for more than 20 minutes.  You have clear fluid draining out of your nose.  You have a swelling on the inside of your nose that does not get better.  You have trouble moving your eyes.  You keep throwing up (vomiting). Summary  A nasal fracture is a broken nose.  Symptoms include pain, swelling, and bruising.  Minor breaks often do not require treatment. More serious breaks may require surgery or other treatments.  If your nose bleeds, sit up while you gently squeeze your nose shut for 10 minutes. This information is not intended to replace advice given to you by your health care provider. Make sure you discuss any questions you have with your health care provider. Document Revised: 03/13/2018 Document Reviewed: 03/13/2018 Elsevier Patient Education  2020 [employer]   Syncope Syncope is when you pass out (faint) for a short time. It is caused by a sudden decrease in blood flow to the brain. Signs that you may be about to pass out include:  Feeling dizzy or light-headed.  Feeling sick to your stomach (nauseous).  Seeing all white or all black.  Having cold, clammy skin. If you pass out, get help right away. Call your local emergency services (911  in the [place] Do not drive yourself to the hospital. Follow these instructions at home: Watch for any changes in your symptoms. Take these actions to stay safe and help with your symptoms: Lifestyle  Do not drive, use machinery, or play sports until your doctor says it is okay.  Do not drink alcohol.  Do not use any products that contain nicotine or tobacco, such as cigarettes and e-cigarettes. If you need help quitting, ask your doctor.  Drink enough fluid to keep your pee (urine) pale yellow. General instructions  Take over-the-counter and prescription medicines only as told by your doctor.  If you are  taking blood pressure or heart medicine, sit up and stand up slowly. Spend a few minutes getting ready to sit and then stand. This can help you feel less dizzy.  Have someone stay with you until you feel stable.  If you start to feel like you might pass out, lie down right away and raise (elevate) your feet above the level of your heart. Breathe deeply and steadily. Wait until all of the symptoms are gone.  Keep all follow-up visits as told by your doctor. This is important. Get help right away if:  You have a very bad headache.  You pass out once or more than once.  You have pain in your chest, belly, or back.  You have a very fast or uneven heartbeat (palpitations).  It hurts to breathe.  You are bleeding from your mouth or your bottom (rectum).  You have black or tarry poop (stool).  You have jerky movements that you cannot control (seizure).  You are confused.  You have trouble walking.  You are very weak.  You have vision problems. These symptoms may be an emergency. Do not wait to see if the symptoms will go away. Get medical help right away. Call your local emergency services (911 in the [place] Do not drive yourself to the hospital. Summary  Syncope is when you pass out (faint) for a short time. It is caused by a sudden decrease in blood flow to the brain.  Signs that you may be about to faint include feeling dizzy, light-headed, or sick to your stomach, seeing all white or all black, or having cold, clammy skin.  If you start to feel like you might pass out, lie down right away and raise (elevate) your feet above the level of your heart. Breathe deeply and steadily. Wait until all of the symptoms are gone. This information is not intended to replace advice given to you by your health care provider. Make sure you discuss any questions you have with your health care provider. Document Revised: 11/22/2017 Document Reviewed: 11/22/2017 Elsevier Patient Education  2020  [employer].

## 2020-10-17 NOTE — Progress Notes (Signed)
AVS given to patient and explained at the bedside. Medications and follow up appointments have been explained with pt verbalizing understanding.  

## 2020-10-17 NOTE — Discharge Summary (Signed)
[facility]  Physician Discharge Summary   Patient ID: [name] MRN: [id number] DOB/AGE: Jul 13, 1961 59 y.o.  Admit date: 10/15/2020 Discharge date: 10/17/2020  PCP: [name], FNP  DISCHARGE DIAGNOSES:  Syncope, likely due to hypotension from hypovolemia Acute kidney injury, improved Minimally displaced fracture of the anterior right nasal bone Hypotension, improved Chronic pain syndrome Diabetes mellitus type 2 uncontrolled with hyperglycemia History of coronary artery disease Chronic diastolic CHF Morbid obesity  RECOMMENDATIONS FOR OUTPATIENT FOLLOW UP: 1. Patient's dose of Lasix has been decreased and he has been asked to resume it only from 12/29 2. Lisinopril has been held for now.  Can be resumed if renal function is stable at follow-up with cardiology    Home Health: None Equipment/Devices: None  CODE STATUS: Full code  DISCHARGE CONDITION: fair  Diet recommendation: Modified carbohydrate  INITIAL HISTORY: 59 y.o.malewith medical history significant ofDM2, chronic pain, CAD, HFpEF. Presenting after a syncopal episode w/ fall. He reports that earlier yesterday he was feeling "woosy and tired" but generally ok. He went to sleep without issue. He got up this morning to go to the bathroom and fell. He does not remember the entire fall. He knows he fell onto his face. He doesn't think he was out long because the act of hitting his face woke him out. It was difficult for him to get up, so he called EMS for help. They got him and brought him to the ED. He denies any other alleviating or aggravating factors.  ED Course:Laceration to the bridge of the nose was sutured. Found to have AKI and found to be hypotensive. He was given 2L NS bolus. [facility] was called for admission.  Consultations:  Phone consultation with Dr. Constance Holster with ENT  Procedures:  None   HOSPITAL COURSE:    Syncope Patient was apparently sitting at the side of his bed  and before he could get up he found himself on the floor.  Presentation was very suspicious for arrhythmia.  However patient was also noted to be hypotensive.  He also had acute renal failure.  It is quite possible that he was quite hypovolemic which could have resulted in the syncope. In any case patient was monitored on telemetry for 48 hours.  No arrhythmias noted.  EKG does not show any acute findings.  Echocardiogram showed normal systolic function.  Right ventricular function is also normal.  No significant valvular abnormalities noted.   Orthostatics are negative when checked on the day after admission.  Renal function has improved.  He feels better.  He is ambulated without difficulty.  He has a follow-up appointment with his cardiologist on January 4 which she should keep.  Acute kidney injury Baseline creatinine as around 0.71.  Presented with creatinine of 3.06.  He was hydrated.  1.44 today.  He has good urine output.  No hydronephrosis noted on renal ultrasound.  Minimally displaced fracture of the anterior right nasal bone with evidence for possible hemosinus Abnormal findings noted on CT scan of the maxillofacial area.  Findings discussed with Dr. Constance Holster who recommends follow-up in the office in 5 days after swelling has subsided.  Hypotension in the setting of history of essential hypertension Blood pressures in the ED were in the 0000000 systolic.  His antihypertensives were held.  Blood pressure has rebounded.  Orthostatics were checked and patient did not have a significant drop in blood pressure when he stood up.   Blood pressure now in the hypertensive range.  Some of  his antihypertensives and cardiac medications have been resumed.    Hyperlipidemia Continue statin.  Chronic pain syndrome Patient on gabapentin, Cymbalta, oxycodone, tizanidine.  Most of these medications were placed on hold.  Patient complaining of pain this morning.  They will be resumed gradually.  Diabetes  mellitus type 2 uncontrolled with hyperglycemia HbA1c 10.8.  Patient is on U5 100 insulin at home.    History of coronary artery disease Follows with Dr. Stanford Breed.  Continue home medications.  Lisinopril held for now due to elevated creatinine.  Will need to be resumed if renal function is stable at follow-up  Chronic diastolic CHF Patient was hypovolemic at presentation as discussed above.  He was taking furosemide 40 mg 3 times a day.  He was told to cut back to once a day starting from Wednesday.  Morbid obesity Estimated body mass index is 45.2 kg/m as calculated from the following:   Height as of this encounter: 5\' 10"  (1.778 m).   Weight as of this encounter: 142.9 kg.   Overall patient seems to be stable.  Feels better.  Okay for discharge today.   PERTINENT LABS:  The results of significant diagnostics from this hospitalization (including imaging, microbiology, ancillary and laboratory) are listed below for reference.    Microbiology: Recent Results (from the past 240 hour(s))  Resp Panel by RT-PCR (Flu A&B, Covid) Nasopharyngeal Swab     Status: None   Collection Time: 10/15/20  7:34 AM   Specimen: Nasopharyngeal Swab; Nasopharyngeal(NP) swabs in vial transport medium  Result Value Ref Range Status   SARS Coronavirus 2 by RT PCR NEGATIVE NEGATIVE Final    Comment: (NOTE) SARS-CoV-2 target nucleic acids are NOT DETECTED.  The SARS-CoV-2 RNA is generally detectable in upper respiratory specimens during the acute phase of infection. The lowest concentration of SARS-CoV-2 viral copies this assay can detect is 138 copies/mL. A negative result does not preclude SARS-Cov-2 infection and should not be used as the sole basis for treatment or other patient management decisions. A negative result may occur with  improper specimen collection/handling, submission of specimen other than nasopharyngeal swab, presence of viral mutation(s) within the areas targeted by this assay,  and inadequate number of viral copies(<138 copies/mL). A negative result must be combined with clinical observations, patient history, and epidemiological information. The expected result is Negative.  Fact Sheet for Patients:  EntrepreneurPulse.com.au  Fact Sheet for Healthcare Providers:  IncredibleEmployment.be  This test is no t yet approved or cleared by the [place] FDA and  has been authorized for detection and/or diagnosis of SARS-CoV-2 by FDA under an Emergency Use Authorization (EUA). This EUA will remain  in effect (meaning this test can be used) for the duration of the COVID-19 declaration under Section 564(b)(1) of the Act, 21 U.S.C.section 360bbb-3(b)(1), unless the authorization is terminated  or revoked sooner.       Influenza A by PCR NEGATIVE NEGATIVE Final   Influenza B by PCR NEGATIVE NEGATIVE Final    Comment: (NOTE) The Xpert Xpress SARS-CoV-2/FLU/RSV plus assay is intended as an aid in the diagnosis of influenza from Nasopharyngeal swab specimens and should not be used as a sole basis for treatment. Nasal washings and aspirates are unacceptable for Xpert Xpress SARS-CoV-2/FLU/RSV testing.  Fact Sheet for Patients: EntrepreneurPulse.com.au  Fact Sheet for Healthcare Providers: IncredibleEmployment.be  This test is not yet approved or cleared by the [place] FDA and has been authorized for detection and/or diagnosis of SARS-CoV-2 by FDA under an Emergency Use  Authorization (EUA). This EUA will remain in effect (meaning this test can be used) for the duration of the COVID-19 declaration under Section 564(b)(1) of the Act, 21 U.S.C. section 360bbb-3(b)(1), unless the authorization is terminated or revoked.  Performed at [facility], [place] [address] [place], [place] [address]      Labs:  COVID-19 Labs   Lab Results  Component Value Date    [place] 10/15/2020   [place] NEGATIVE 08/18/2020   [place] NEGATIVE 04/27/2020   [place] NEGATIVE 08/22/2019      Basic Metabolic Panel: Recent Labs  Lab 10/15/20 0359 10/16/20 0742 10/17/20 0430  NA 138 137 134*  K 4.5 4.6 4.1  CL 100 100 97*  CO2 23 23 23   GLUCOSE 156* 269* 279*  BUN 65* 41* 34*  CREATININE 3.06* 1.63* 1.44*  CALCIUM 9.3 10.6* 10.4*   CBC: Recent Labs  Lab 10/15/20 0359 10/16/20 0742 10/17/20 0430  WBC 9.3 9.9 9.7  HGB 12.0* 13.3 12.5*  HCT 36.1* 40.4 37.9*  MCV 93.0 95.3 93.8  PLT 232 262 262   BNP: BNP (last 3 results) Recent Labs    10/15/20 0359  BNP 40.0     CBG: Recent Labs  Lab 10/16/20 1647 10/16/20 2059 10/17/20 0634 10/17/20 0717 10/17/20 1158  GLUCAP 316* 311* 254* 291* 267*     IMAGING STUDIES DG Forearm Right  Result Date: 10/15/2020 CLINICAL DATA:  Hand and arm pain after unwitnessed fall EXAM: RIGHT FOREARM - 2 VIEW COMPARISON:  None. FINDINGS: No evidence of forearm fracture or subluxation. Enthesophytes are present in the supracondylar humerus with mild marginal spurring at the proximal radius and ulna. Cluster of soft tissue calcifications in the ventral and proximal forearm which are rounded and usually seen with phleboliths. No visible superimposed soft tissue mass. IMPRESSION: No acute finding. Electronically Signed   By: Monte Fantasia M.D.   On: 10/15/2020 04:28   CT Head Wo Contrast  Result Date: 10/15/2020 CLINICAL DATA:  Syncope, fall from seated position, laceration to bridge of nose EXAM: CT HEAD WITHOUT CONTRAST CT MAXILLOFACIAL WITHOUT CONTRAST CT CERVICAL SPINE WITHOUT CONTRAST TECHNIQUE: Multidetector CT imaging of the head, cervical spine, and maxillofacial structures were performed using the standard protocol without intravenous contrast. Multiplanar CT image reconstructions of the cervical spine and maxillofacial structures were also generated. COMPARISON:  CT head  05/04/2019, CTa head neck 07/03/2018, MR cervical spine 06/29/2018, MR head 07/01/2018 FINDINGS: CT HEAD FINDINGS Brain: No evidence of acute infarction, hemorrhage, hydrocephalus, extra-axial collection, visible mass lesion or mass effect. Basal cisterns are patent. Midline intracranial structures are unremarkable. Cerebellar tonsils are normally position. Benign dural calcifications, stable from priors. Vascular: Atherosclerotic calcification of the carotid siphons and intradural vertebral arteries. No hyperdense vessel. Skull: No significant scalp swelling or hematoma. Stable infiltration/scarring in the right frontal scalp. Benign dermal calcifications. No calvarial fracture or other acute or worrisome lesions of the skull. Other: None. CT MAXILLOFACIAL FINDINGS Osseous: No fracture of the bony orbits. A minimal age-indeterminate deformity of the right nasal bone. Slightly prominent vascular channel extending along the right maxillary sinus is unchanged from July. No other mid face fractures are seen. The pterygoid plates are intact. No visible or suspected temporal bone fractures. The mandible is intact. 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 No fractured or avulsed teeth. Orbits: Proptotic appearance of the globes. The globes appear otherwise normal and symmetric. No  retro septal gas, stranding or hemorrhage. Symmetric appearance of the extraocular musculature and optic nerve sheath complexes. Normal caliber of the superior ophthalmic veins. Sinuses: Mild nodular mural thickening in the ethmoids and right maxillary sinus. Layering fluid in the right maxillary sinus could reflect acute sinusitis or layering hemosinus. Mastoid air cells are predominantly clear. Middle ear cavities are clear. Ossicular chains appear normally configured. Soft tissues: Soft tissue swelling and  laceration across the nasal bridge. No other significant soft tissue swelling, gas or foreign body. Diffuse dermal calcifications are similar to prior. CT CERVICAL SPINE FINDINGS Alignment: Stabilization collar is absent at the time of examination. Straightening and slight reversal the normal cervical lordosis is somewhat more pronounced than on prior exam and may be related to positioning versus muscle spasm. No evidence of traumatic listhesis. No abnormally widened, perched or jumped facets. Normal alignment of the craniocervical and atlantoaxial articulations. Skull base and vertebrae: Photon starvation artifact may limit detection of subtle anomaly below the level of C4. No acute skull base fracture. No vertebral body fracture or height loss. Normal bone mineralization. No worrisome osseous lesions. Multilevel cervical spondylitic changes as below. Additional arthrosis at the atlantodental and basion dens interval. Soft tissues and spinal canal: No pre or paravertebral fluid or swelling. No visible canal hematoma. 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 Disc levels: Multilevel intervertebral disc height loss with spondylitic endplate changes. In combination with ossification of the posterior longitudinal ligament there is at least mild resulting canal stenosis C4-C6. Multilevel uncinate spurring and facet hypertrophic changes are present as well resulting in at most mild to moderate foraminal narrowing bilaterally C5-6, C6-7, right greater than left. Upper chest: No acute abnormality in the upper chest or imaged lung apices. Other: Normal thyroid. Cervical carotid atherosclerosis noted at the right carotid bifurcation and more proximal common carotid. IMPRESSION: 1. No acute intracranial abnormality. No significant scalp swelling or hematoma. 2. Soft tissue swelling and laceration across the nasal bridge with likely acute minimally displaced fracture of the anterior right  nasal bone. 3. 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 Likely contributing to the anterior translation of the bilateral temporomandibular joints though could correlate for tenderness. 4. No other acute facial bone fractures. 5. Layering fluid in the right maxillary sinus could reflect acute sinusitis or layering hemosinus. 6. Mild proptotic appearance of the globes, correlate with clinical findings. 7. No acute cervical spine fracture or traumatic listhesis. 8. Multilevel cervical spondylitic and facet hypertrophic changes as described above. 9. Intracranial and cervical atherosclerosis. Electronically Signed   By: Lovena Le M.D.   On: 10/15/2020 06:09   CT CERVICAL SPINE WO CONTRAST  Result Date: 10/15/2020 CLINICAL DATA:  Syncope, fall from seated position, laceration to bridge of nose EXAM: CT HEAD WITHOUT CONTRAST CT MAXILLOFACIAL WITHOUT CONTRAST CT CERVICAL SPINE WITHOUT CONTRAST TECHNIQUE: Multidetector CT imaging of the head, cervical spine, and maxillofacial structures were performed using the standard protocol without intravenous contrast. Multiplanar CT image reconstructions of the cervical spine and maxillofacial structures were also generated. COMPARISON:  CT head 05/04/2019, CTa head neck 07/03/2018, MR cervical spine 06/29/2018, MR head 07/01/2018 FINDINGS: CT HEAD FINDINGS Brain: No evidence of acute infarction, hemorrhage, hydrocephalus, extra-axial collection, visible mass lesion or mass effect. Basal cisterns are patent. Midline intracranial structures are unremarkable. Cerebellar tonsils are normally position. Benign dural calcifications, stable from priors. Vascular: Atherosclerotic calcification of the carotid siphons and intradural vertebral arteries. No hyperdense vessel. Skull: No significant scalp swelling or hematoma. Stable  infiltration/scarring in the right frontal scalp. Benign dermal calcifications. No calvarial fracture or other acute or worrisome  lesions of the skull. Other: None. CT MAXILLOFACIAL FINDINGS Osseous: No fracture of the bony orbits. A minimal age-indeterminate deformity of the right nasal bone. Slightly prominent vascular channel extending along the right maxillary sinus is unchanged from July. No other mid face fractures are seen. The pterygoid plates are intact. No visible or suspected temporal bone fractures. The mandible is intact. 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 No fractured or avulsed teeth. Orbits: Proptotic appearance of the globes. The globes appear otherwise normal and symmetric. No retro septal gas, stranding or hemorrhage. Symmetric appearance of the extraocular musculature and optic nerve sheath complexes. Normal caliber of the superior ophthalmic veins. Sinuses: Mild nodular mural thickening in the ethmoids and right maxillary sinus. Layering fluid in the right maxillary sinus could reflect acute sinusitis or layering hemosinus. Mastoid air cells are predominantly clear. Middle ear cavities are clear. Ossicular chains appear normally configured. Soft tissues: Soft tissue swelling and laceration across the nasal bridge. No other significant soft tissue swelling, gas or foreign body. Diffuse dermal calcifications are similar to prior. CT CERVICAL SPINE FINDINGS Alignment: Stabilization collar is absent at the time of examination. Straightening and slight reversal the normal cervical lordosis is somewhat more pronounced than on prior exam and may be related to positioning versus muscle spasm. No evidence of traumatic listhesis. No abnormally widened, perched or jumped facets. Normal alignment of the craniocervical and atlantoaxial articulations. Skull base and vertebrae: Photon starvation artifact may limit detection of subtle anomaly below the level of C4. No acute skull base fracture. No  vertebral body fracture or height loss. Normal bone mineralization. No worrisome osseous lesions. Multilevel cervical spondylitic changes as below. Additional arthrosis at the atlantodental and basion dens interval. Soft tissues and spinal canal: No pre or paravertebral fluid or swelling. No visible canal hematoma. 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 Disc levels: Multilevel intervertebral disc height loss with spondylitic endplate changes. In combination with ossification of the posterior longitudinal ligament there is at least mild resulting canal stenosis C4-C6. Multilevel uncinate spurring and facet hypertrophic changes are present as well resulting in at most mild to moderate foraminal narrowing bilaterally C5-6, C6-7, right greater than left. Upper chest: No acute abnormality in the upper chest or imaged lung apices. Other: Normal thyroid. Cervical carotid atherosclerosis noted at the right carotid bifurcation and more proximal common carotid. IMPRESSION: 1. No acute intracranial abnormality. No significant scalp swelling or hematoma. 2. Soft tissue swelling and laceration across the nasal bridge with likely acute minimally displaced fracture of the anterior right nasal bone. 3. 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 Likely contributing to the anterior translation of the bilateral temporomandibular joints though could correlate for tenderness. 4. No other acute facial bone fractures. 5. Layering fluid in the right maxillary sinus could reflect acute sinusitis or layering hemosinus. 6. Mild proptotic appearance of the globes, correlate with clinical findings. 7. No acute cervical spine fracture or traumatic listhesis. 8. Multilevel cervical spondylitic and facet hypertrophic changes as described above. 9. Intracranial and cervical atherosclerosis. Electronically Signed   By: Lovena Le M.D.   On: 10/15/2020 06:09   US RENAL  Result  Date: 10/15/2020 CLINICAL DATA:  Acute kidney injury. Type 2 diabetes and hypertension. EXAM: RENAL / URINARY TRACT ULTRASOUND COMPLETE COMPARISON:  Abdominopelvic CT  04/27/2020. FINDINGS: Right Kidney: Renal measurements: 13.5 x 5.5 x 5.7 cm = volume: 218.0 mL. Echogenicity within normal limits. No mass or hydronephrosis visualized. Left Kidney: Renal measurements: 12.0 x 6.0 x 6.5 cm = volume: 244.0 mL. Echogenicity within normal limits. No mass or hydronephrosis visualized. Visualization of the left kidney is suboptimal due to bowel gas and body habitus, especially in the upper pole. Bladder: Decompressed and not visualized. Other: Increased hepatic echogenicity, likely due to steatosis. IMPRESSION: 1. Both kidneys are normal in size without hydronephrosis. Suboptimal visualization of the left kidney. 2. Probable hepatic steatosis. Electronically Signed   By: Richardean Sale M.D.   On: 10/15/2020 11:40   DG Hand Complete Left  Result Date: 10/15/2020 CLINICAL DATA:  Fall with hand pain EXAM: LEFT HAND - COMPLETE 3+ VIEW COMPARISON:  11/07/2018 FINDINGS: No acute fracture or dislocation. Symmetric to the right is diffuse degenerative marginal spurring especially of the interphalangeal joints but also affecting the carpus and MCP joints. No evidence of foreign body IMPRESSION: 1. No acute finding. 2. Diffuse degenerative spurring. Electronically Signed   By: Monte Fantasia M.D.   On: 10/15/2020 04:27   DG Hand Complete Right  Result Date: 10/15/2020 CLINICAL DATA:  Arm pain after unwitnessed fall/syncope. EXAM: RIGHT HAND - COMPLETE 3+ VIEW COMPARISON:  None. FINDINGS: Diffuse joint space narrowing and/or marginal spurring of the interphalangeal and metacarpal phalangeal joints. Mild spurring at the carpus and carpometacarpal joints. No evidence of fracture, dislocation, or foreign body. IMPRESSION: 1. No acute finding. 2. Diffuse osteoarthritic spurring. Electronically Signed   By: Monte Fantasia M.D.    On: 10/15/2020 04:26   ECHOCARDIOGRAM COMPLETE  Result Date: 10/15/2020    ECHOCARDIOGRAM REPORT   Patient Name:   GOLDMAN CULLOM Date of Exam: 10/15/2020 Medical Rec #:  [id number]        Height:       70.0 in Accession #:    [id number]       Weight:       315.0 lb Date of Birth:  04/17/1961       BSA:          2.532 m Patient Age:    63 years         BP:           120/63 mmHg Patient Gender: M                HR:           70 bpm. Exam Location:  Inpatient Procedure: 2D Echo, Cardiac Doppler and Color Doppler Indications:    R55 Syncope  History:        Patient has prior history of Echocardiogram examinations, most                 recent 04/22/2019. CHF, CAD, Stroke; Risk Factors:Diabetes,                 Hypertension and Dyslipidemia.  Sonographer:    Bernadene Person RDCS Referring Phys: [id number] [place]  1. Left ventricular ejection fraction, by estimation, is 55 to 60%. The left ventricle has normal function. The left ventricle has no regional wall motion abnormalities. The left ventricular internal cavity size was mildly dilated. Left ventricular diastolic parameters were normal.  2. Right ventricular systolic function is normal. The right ventricular size is normal. There is normal pulmonary artery systolic pressure.  3. The mitral valve is normal in structure. Trivial mitral valve regurgitation. No evidence  of mitral stenosis.  4. The aortic valve is tricuspid. Aortic valve regurgitation is not visualized. No aortic stenosis is present.  5. The inferior vena cava is dilated in size with >50% respiratory variability, suggesting right atrial pressure of 8 mmHg. FINDINGS  Left Ventricle: Left ventricular ejection fraction, by estimation, is 55 to 60%. The left ventricle has normal function. The left ventricle has no regional wall motion abnormalities. The left ventricular internal cavity size was mildly dilated. There is  no left ventricular hypertrophy. Left ventricular diastolic  parameters were normal. Indeterminate filling pressures. Right Ventricle: The right ventricular size is normal. No increase in right ventricular wall thickness. Right ventricular systolic function is normal. There is normal pulmonary artery systolic pressure. The tricuspid regurgitant velocity is 1.82 m/s, and  with an assumed right atrial pressure of 8 mmHg, the estimated right ventricular systolic pressure is 0000000 mmHg. Left Atrium: Left atrial size was normal in size. Right Atrium: Right atrial size was normal in size. Pericardium: There is no evidence of pericardial effusion. Mitral Valve: The mitral valve is normal in structure. Trivial mitral valve regurgitation. No evidence of mitral valve stenosis. Tricuspid Valve: The tricuspid valve is normal in structure. Tricuspid valve regurgitation is trivial. No evidence of tricuspid stenosis. Aortic Valve: The aortic valve is tricuspid. Aortic valve regurgitation is not visualized. No aortic stenosis is present. Pulmonic Valve: The pulmonic valve was normal in structure. Pulmonic valve regurgitation is not visualized. No evidence of pulmonic stenosis. Aorta: The aortic root is normal in size and structure. Venous: The inferior vena cava is dilated in size with greater than 50% respiratory variability, suggesting right atrial pressure of 8 mmHg. IAS/Shunts: No atrial level shunt detected by color flow Doppler.  LEFT VENTRICLE PLAX 2D LVIDd:         5.70 cm      Diastology LVIDs:         4.40 cm      LV e' medial:    7.62 cm/s LV PW:         1.00 cm      LV E/e' medial:  11.6 LV IVS:        0.80 cm      LV e' lateral:   9.90 cm/s LVOT diam:     2.10 cm      LV E/e' lateral: 9.0 LV SV:         95 LV SV Index:   38 LVOT Area:     3.46 cm  LV Volumes (MOD) LV vol d, MOD A2C: 130.0 ml LV vol d, MOD A4C: 101.0 ml LV vol s, MOD A2C: 46.5 ml LV vol s, MOD A4C: 49.7 ml LV SV MOD A2C:     83.5 ml LV SV MOD A4C:     101.0 ml LV SV MOD BP:      74.3 ml RIGHT VENTRICLE RV S  prime:     20.50 cm/s TAPSE (M-mode): 2.8 cm LEFT ATRIUM             Index       RIGHT ATRIUM           Index LA diam:        3.50 cm 1.38 cm/m  RA Area:     15.00 cm LA Vol (A2C):   51.9 ml 20.50 ml/m RA Volume:   34.80 ml  13.75 ml/m LA Vol (A4C):   51.3 ml 20.26 ml/m LA Biplane Vol: 55.0 ml 21.72 ml/m  AORTIC  VALVE LVOT Vmax:   162.00 cm/s LVOT Vmean:  107.000 cm/s LVOT VTI:    0.275 m  AORTA Ao Root diam: 3.40 cm Ao Asc diam:  3.10 cm MITRAL VALVE               TRICUSPID VALVE MV Area (PHT): 2.73 cm    TR Peak grad:   13.2 mmHg MV Decel Time: 278 msec    TR Vmax:        182.00 cm/s MV E velocity: 88.70 cm/s MV A velocity: 76.30 cm/s  SHUNTS MV E/A ratio:  1.16        Systemic VTI:  0.28 m                            Systemic Diam: 2.10 cm Skeet Latch MD Electronically signed by Skeet Latch MD Signature Date/Time: 10/15/2020/12:41:49 PM    Final    CT Maxillofacial Wo Contrast  Result Date: 10/15/2020 CLINICAL DATA:  Syncope, fall from seated position, laceration to bridge of nose EXAM: CT HEAD WITHOUT CONTRAST CT MAXILLOFACIAL WITHOUT CONTRAST CT CERVICAL SPINE WITHOUT CONTRAST TECHNIQUE: Multidetector CT imaging of the head, cervical spine, and maxillofacial structures were performed using the standard protocol without intravenous contrast. Multiplanar CT image reconstructions of the cervical spine and maxillofacial structures were also generated. COMPARISON:  CT head 05/04/2019, CTa head neck 07/03/2018, MR cervical spine 06/29/2018, MR head 07/01/2018 FINDINGS: CT HEAD FINDINGS Brain: No evidence of acute infarction, hemorrhage, hydrocephalus, extra-axial collection, visible mass lesion or mass effect. Basal cisterns are patent. Midline intracranial structures are unremarkable. Cerebellar tonsils are normally position. Benign dural calcifications, stable from priors. Vascular: Atherosclerotic calcification of the carotid siphons and intradural vertebral arteries. No hyperdense vessel.  Skull: No significant scalp swelling or hematoma. Stable infiltration/scarring in the right frontal scalp. Benign dermal calcifications. No calvarial fracture or other acute or worrisome lesions of the skull. Other: None. CT MAXILLOFACIAL FINDINGS Osseous: No fracture of the bony orbits. A minimal age-indeterminate deformity of the right nasal bone. Slightly prominent vascular channel extending along the right maxillary sinus is unchanged from July. No other mid face fractures are seen. The pterygoid plates are intact. No visible or suspected temporal bone fractures. The mandible is intact. 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 No fractured or avulsed teeth. Orbits: Proptotic appearance of the globes. The globes appear otherwise normal and symmetric. No retro septal gas, stranding or hemorrhage. Symmetric appearance of the extraocular musculature and optic nerve sheath complexes. Normal caliber of the superior ophthalmic veins. Sinuses: Mild nodular mural thickening in the ethmoids and right maxillary sinus. Layering fluid in the right maxillary sinus could reflect acute sinusitis or layering hemosinus. Mastoid air cells are predominantly clear. Middle ear cavities are clear. Ossicular chains appear normally configured. Soft tissues: Soft tissue swelling and laceration across the nasal bridge. No other significant soft tissue swelling, gas or foreign body. Diffuse dermal calcifications are similar to prior. CT CERVICAL SPINE FINDINGS Alignment: Stabilization collar is absent at the time of examination. Straightening and slight reversal the normal cervical lordosis is somewhat more pronounced than on prior exam and may be related to positioning versus muscle spasm. No evidence of traumatic listhesis. No abnormally widened, perched or jumped facets. Normal alignment of the  craniocervical and atlantoaxial articulations. Skull base and vertebrae: Photon starvation artifact may limit detection of subtle anomaly  below the level of C4. No acute skull base fracture. No vertebral body fracture or height loss. Normal bone mineralization. No worrisome osseous lesions. Multilevel cervical spondylitic changes as below. Additional arthrosis at the atlantodental and basion dens interval. Soft tissues and spinal canal: No pre or paravertebral fluid or swelling. No visible canal hematoma. 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 Disc levels: Multilevel intervertebral disc height loss with spondylitic endplate changes. In combination with ossification of the posterior longitudinal ligament there is at least mild resulting canal stenosis C4-C6. Multilevel uncinate spurring and facet hypertrophic changes are present as well resulting in at most mild to moderate foraminal narrowing bilaterally C5-6, C6-7, right greater than left. Upper chest: No acute abnormality in the upper chest or imaged lung apices. Other: Normal thyroid. Cervical carotid atherosclerosis noted at the right carotid bifurcation and more proximal common carotid. IMPRESSION: 1. No acute intracranial abnormality. No significant scalp swelling or hematoma. 2. Soft tissue swelling and laceration across the nasal bridge with likely acute minimally displaced fracture of the anterior right nasal bone. 3. 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 Likely contributing to the anterior translation of the bilateral temporomandibular joints though could correlate for tenderness. 4. No other acute facial bone fractures. 5. Layering fluid in the right maxillary sinus could reflect acute sinusitis or layering hemosinus. 6. Mild proptotic appearance of the globes, correlate with clinical findings. 7. No acute cervical spine fracture or traumatic listhesis. 8. Multilevel cervical spondylitic  and facet hypertrophic changes as described above. 9. Intracranial and cervical atherosclerosis. Electronically Signed   By: Lovena Le M.D.   On: 10/15/2020 06:09    DISCHARGE EXAMINATION: Vitals:   10/16/20 2103 10/17/20 0500 10/17/20 0614 10/17/20 1100  BP: (!) 152/75  (!) 150/69 134/74  Pulse: 91  82 85  Resp: 18  18 16   Temp: 99 F (37.2 C)  98.2 F (36.8 C) 98.3 F (36.8 C)  TempSrc: Oral  Oral Oral  SpO2: 96%  97% 98%  Weight:  (!) 142.8 kg    Height:       General appearance: Awake alert.  In no distress Resp: Clear to auscultation bilaterally.  Normal effort Cardio: S1-S2 is normal regular.  No S3-S4.  No rubs murmurs or bruit GI: Abdomen is soft.  Nontender nondistended.  Bowel sounds are present normal.  No masses organomegaly    DISPOSITION: Home  Discharge Instructions    (HEART FAILURE PATIENTS) Call MD:  Anytime you have any of the following symptoms: 1) 3 pound weight gain in 24 hours or 5 pounds in 1 week 2) shortness of breath, with or without a dry hacking cough 3) swelling in the hands, feet or stomach 4) if you have to sleep on extra pillows at night in order to breathe.   Complete by: As directed    Call MD for:  difficulty breathing, headache or visual disturbances   Complete by: As directed    Call MD for:  extreme fatigue   Complete by: As directed    Call MD for:  persistant dizziness or light-headedness   Complete by: As directed    Call MD for:  persistant nausea and vomiting   Complete by: As directed    Call MD for:  severe uncontrolled pain   Complete by: As directed    Call MD for:  temperature >100.4   Complete by: As directed    Diet - low sodium heart healthy   Complete by: As  directed    Diet Carb Modified   Complete by: As directed    Discharge instructions   Complete by: As directed    1.  Please do not take your lisinopril until you have been seen by your cardiologist on January 4 2. Please note that your dose of Lasix has been  decreased to once a day for now.  Resume only from December 29.  You were cared for by a hospitalist during your hospital stay. If you have any questions about your discharge medications or the care you received while you were in the hospital after you are discharged, you can call the unit and asked to speak with the hospitalist on call if the hospitalist that took care of you is not available. Once you are discharged, your primary care physician will handle any further medical issues. Please note that NO REFILLS for any discharge medications will be authorized once you are discharged, as it is imperative that you return to your primary care physician (or establish a relationship with a primary care physician if you do not have one) for your aftercare needs so that they can reassess your need for medications and monitor your lab values. If you do not have a primary care physician, you can call [phone] for a physician referral.   Discharge wound care:   Complete by: As directed    Keep current dressing on your nose.  Be sure to call Dr. Lucky Rathke office on Monday to schedule appointment.   Increase activity slowly   Complete by: As directed    No wound care   Complete by: As directed         Allergies as of 10/17/2020      Reactions   Coconut Flavor [flavoring Agent] Hives   Coconut Oil Hives   Ibuprofen Other (See Comments)   Made gastric ulcers worse   Aleve [naproxen] Other (See Comments)   DUE TO KIDNEYS   Nsaids Other (See Comments)   Stomach ulcers   Vascepa [icosapent Ethyl] Other (See Comments)   headaches, chest pain, similar to sx of a stroke, hypotension       Medication List    STOP taking these medications   lisinopril 10 MG tablet Commonly known as: ZESTRIL   pantoprazole 40 MG tablet Commonly known as: PROTONIX     TAKE these medications   Accu-Chek FastClix Lancets Misc 1 Device by Other route 2 (two) times daily.   Albuterol Sulfate 108 (90 Base) MCG/ACT  Aepb Commonly known as: ProAir RespiClick Inhale 2 puffs into the lungs every 6 (six) hours as needed. What changed: reasons to take this   allopurinol 100 MG tablet Commonly known as: ZYLOPRIM Take 100 mg by mouth daily.   aspirin 81 MG chewable tablet Chew 1 tablet (81 mg total) by mouth daily.   atorvastatin 40 MG tablet Commonly known as: LIPITOR Take 1 tablet (40 mg total) by mouth daily. What changed: when to take this   carvedilol 25 MG tablet Commonly known as: COREG Take 1 tablet (25 mg total) by mouth 2 (two) times daily with a meal.   diclofenac 1.3 % Ptch Commonly known as: FLECTOR Place 1 patch onto the skin daily as needed for pain.   DULoxetine 60 MG capsule Commonly known as: CYMBALTA Take 1 capsule (60 mg total) by mouth daily. Please request future refills from PCP.   famotidine 20 MG tablet Commonly known as: PEPCID Take 20 mg by mouth daily.   fenofibrate  145 MG tablet Commonly known as: TRICOR Take 1 tablet (145 mg total) by mouth daily.   furosemide 40 MG tablet Commonly known as: Lasix Take 1 tablet (40 mg total) by mouth daily. PLEASE RESUME ONLY FROM 10/21/2020. DOSE HAS BEEN CHANGED. Start taking on: October 21, 2020 What changed:   when to take this  additional instructions  These instructions start on October 21, 2020. If you are unsure what to do until then, ask your doctor or other care provider.   gabapentin 300 MG capsule Commonly known as: NEURONTIN Take 300 mg by mouth 3 (three) times daily.   glipiZIDE 10 MG tablet Commonly known as: GLUCOTROL Take 10 mg by mouth 2 (two) times daily.   HumuLIN R U-500 KwikPen 500 UNIT/ML kwikpen Generic drug: insulin regular human CONCENTRATED Inject 0-75 Units into the skin See admin instructions. Inject under the skin three times daily as directed or sliding scale: 65 units in the morning, 70 units at lunch, 75 units at bedtime   Insulin Pen Needle 31G X 5 MM Misc Use 1 needle daily  to inject insulin as prescribed What changed:   how much to take  how to take this  when to take this   isosorbide mononitrate 30 MG 24 hr tablet Commonly known as: IMDUR Take 1 tablet (30 mg total) by mouth daily.   nitroGLYCERIN 0.4 MG SL tablet Commonly known as: NITROSTAT Place 1 tablet (0.4 mg total) under the tongue every 5 (five) minutes as needed for chest pain.   ondansetron 4 MG tablet Commonly known as: ZOFRAN Take 4 mg by mouth 2 (two) times daily as needed for nausea/vomiting.   oxyCODONE 15 MG immediate release tablet Commonly known as: ROXICODONE Take 15 mg 3 (three) times daily as needed by mouth for pain.   potassium chloride 10 MEQ CR capsule Commonly known as: MICRO-K Take 10 mEq by mouth in the morning and at bedtime.   tiZANidine 4 MG tablet Commonly known as: ZANAFLEX Take 4 mg by mouth at bedtime as needed for muscle spasms.   traZODone 50 MG tablet Commonly known as: DESYREL Take 1 tablet (50 mg total) by mouth at bedtime.   Vitamin D (Ergocalciferol) 1.25 MG (50000 UNIT) Caps capsule Commonly known as: DRISDOL Take 50,000 Units by mouth every Monday.            Discharge Care Instructions  (From admission, onward)         Start     Ordered   10/17/20 0000  Discharge wound care:       Comments: Keep current dressing on your nose.  Be sure to call Dr. Janeice Robinson office on Monday to schedule appointment.   10/17/20 [id number]            Follow-up Information    [name], FNP. Schedule an appointment as soon as possible for a visit in 1 week(s).   Specialty: Family Medicine Contact information: [place] [address] [phone]        Lelon Perla, MD Follow up on 10/27/2020.   Specialty: Cardiology Why: your appointment is on 10/27/2020 at 10:20 am Contact information: [address] [place] [address] [id number]        Izora Gala, MD. Schedule an appointment as soon as  possible for a visit in 5 day(s).   Specialty: Otolaryngology Why: Please call the office on Monday. Tell them you were told to call for the nasal bone  fracture for a follow-up within 5 days. Contact information: [address] [place] [address] [phone]               TOTAL DISCHARGE TIME: 35 minutes  [facility] Pager on [email or web address]  10/17/2020, 12:45 PM

## 2020-10-20 NOTE — Progress Notes (Signed)
HPI: FU CADand diastolic CHF.Pt had DES to LAD 8/17.CTA 9/17 showed no pulmonary embolus.Cardiac catheterization February 2019 showed patent stents and normal LV function. Left ventricular end-diastolic pressure elevated at 27 mmHg.Carotid Dopplers April 2019 showed 1 to 39% right and near normal left carotid. CTA August 2019 showed no thoracic aortic aneurysm.  Patient seen with syncope December 2021.  BUN 65 and creatinine 3.06 which was new.  Hemoglobin 12. Echocardiogram 12/21 showed normal LV function, mild left ventricular enlargement.  Episode was felt to be orthostatic mediated.  He was hydrated with improvement in his renal function.  His Lasix was decreased from 40 3 times daily to 40 mg once daily.  Since last seenhe denies dyspnea, chest pain, palpitations or recurrent syncope.  No pedal edema.  Current Outpatient Medications  Medication Sig Dispense Refill  . Accu-Chek FastClix Lancets MISC 1 Device by Other route 2 (two) times daily.     . Albuterol Sulfate (PROAIR RESPICLICK) 108 (90 Base) MCG/ACT AEPB Inhale 2 puffs into the lungs every 6 (six) hours as needed. (Patient taking differently: Inhale 2 puffs into the lungs every 6 (six) hours as needed (for breathing).) 1 each 0  . allopurinol (ZYLOPRIM) 100 MG tablet Take 100 mg by mouth daily.    Marland Kitchen aspirin 81 MG chewable tablet Chew 1 tablet (81 mg total) by mouth daily. 30 tablet 0  . atorvastatin (LIPITOR) 40 MG tablet Take 1 tablet (40 mg total) by mouth daily. (Patient taking differently: Take 40 mg by mouth at bedtime.) 90 tablet 3  . carvedilol (COREG) 12.5 MG tablet Take 12.5 mg by mouth 2 (two) times daily.    . diclofenac (FLECTOR) 1.3 % PTCH Place 1 patch onto the skin daily as needed for pain.    . DULoxetine (CYMBALTA) 60 MG capsule Take 1 capsule (60 mg total) by mouth daily. Please request future refills from PCP. 30 capsule 1  . famotidine (PEPCID) 20 MG tablet Take 20 mg by mouth daily.    . fenofibrate  (TRICOR) 145 MG tablet Take 1 tablet (145 mg total) by mouth daily. 90 tablet 3  . furosemide (LASIX) 40 MG tablet Take 1 tablet (40 mg total) by mouth daily. PLEASE RESUME ONLY FROM 10/21/2020. DOSE HAS BEEN CHANGED. 90 tablet 0  . gabapentin (NEURONTIN) 300 MG capsule Take 300 mg by mouth 3 (three) times daily.    Marland Kitchen glipiZIDE (GLUCOTROL) 10 MG tablet Take 10 mg by mouth 2 (two) times daily.  4  . HUMULIN R U-500 KWIKPEN 500 UNIT/ML kwikpen Inject 0-75 Units into the skin See admin instructions. Inject under the skin three times daily as directed or sliding scale: 65 units in the morning, 70 units at lunch, 75 units at bedtime    . Insulin Pen Needle 31G X 5 MM MISC Use 1 needle daily to inject insulin as prescribed (Patient taking differently: 1 each by Other route See admin instructions. Use 1 needle daily to inject insulin as prescribed) 100 each 2  . isosorbide mononitrate (IMDUR) 30 MG 24 hr tablet Take 1 tablet (30 mg total) by mouth daily. 90 tablet 3  . nitroGLYCERIN (NITROSTAT) 0.4 MG SL tablet Place 1 tablet (0.4 mg total) under the tongue every 5 (five) minutes as needed for chest pain. 100 tablet 1  . ondansetron (ZOFRAN) 4 MG tablet Take 4 mg by mouth 2 (two) times daily as needed for nausea/vomiting.    Marland Kitchen oxyCODONE (ROXICODONE) 15 MG immediate release tablet  Take 15 mg 3 (three) times daily as needed by mouth for pain.   0  . potassium chloride (MICRO-K) 10 MEQ CR capsule Take 10 mEq by mouth in the morning and at bedtime.    Marland Kitchen tiZANidine (ZANAFLEX) 4 MG tablet Take 4 mg by mouth at bedtime as needed for muscle spasms.    . traZODone (DESYREL) 50 MG tablet Take 1 tablet (50 mg total) by mouth at bedtime. 30 tablet 11  . Vitamin D, Ergocalciferol, (DRISDOL) 1.25 MG (50000 UT) CAPS capsule Take 50,000 Units by mouth every Monday.     No current facility-administered medications for this visit.     Past Medical History:  Diagnosis Date  . Anemia   . Arthritis   . Back pain   . CAD  (coronary artery disease)    a. s/p DES to LAD in 05/2016  . Cervical radiculopathy   . Chronic diastolic CHF (congestive heart failure) ([facility])   . Chronic pain   . Depression   . DVT (deep venous thrombosis) ([facility])   . Hematemesis   . Hepatic steatosis   . Hyperlipidemia   . Hypertension   . IBS (irritable bowel syndrome)   . Morbid obesity ([facility])   . OSA (obstructive sleep apnea)   . Pancreatitis   . PE (pulmonary thromboembolism) ([facility])   . Peripheral neuropathy   . PUD (peptic ulcer disease)   . Renal disorder   . Stroke [facility])    a. ?details unclear - not seen on imaging when he was admitted in 05/2017 for TIA symptoms which were felt due to cervical radiculopathy.  . Thoracic aortic ectasia ([facility])    a. 4.3cm ectatic ascending thoracic aorta by CT 06/2017.   . Type 2 diabetes mellitus ([place] [facility])     Past Surgical History:  Procedure Laterality Date  . CARDIAC CATHETERIZATION N/A 05/31/2016   Procedure: Left Heart Cath and Coronary Angiography;  Surgeon: Peter M Martinique, MD;  Location: [facility];  Service: Cardiovascular;  Laterality: N/A;  . CARDIAC CATHETERIZATION N/A 05/31/2016   Procedure: Intravascular Pressure Wire/FFR Study;  Surgeon: Peter M Martinique, MD;  Location: [facility];  Service: Cardiovascular;  Laterality: N/A;  . CARDIAC CATHETERIZATION N/A 05/31/2016   Procedure: Coronary Stent Intervention;  Surgeon: Peter M Martinique, MD;  Location: [facility];  Service: Cardiovascular;  Laterality: N/A;  . COLONOSCOPY WITH PROPOFOL N/A 04/29/2020   Procedure: COLONOSCOPY WITH PROPOFOL;  Surgeon: Wilford Corner, MD;  Location: [facility];  Service: Endoscopy;  Laterality: N/A;  . ESOPHAGOGASTRODUODENOSCOPY N/A 04/29/2020   Procedure: ESOPHAGOGASTRODUODENOSCOPY (EGD);  Surgeon: Wilford Corner, MD;  Location: [facility];  Service: Endoscopy;  Laterality: N/A;  . LEFT HEART CATH AND CORONARY ANGIOGRAPHY N/A 12/08/2017   Procedure: LEFT HEART CATH AND CORONARY  ANGIOGRAPHY;  Surgeon: Leonie Man, MD;  Location: [facility];  Service: Cardiovascular;  Laterality: N/A;  . LEFT HEART CATHETERIZATION WITH CORONARY ANGIOGRAM N/A 02/03/2014   Procedure: LEFT HEART CATHETERIZATION WITH CORONARY ANGIOGRAM;  Surgeon: Pixie Casino, MD;  Location[facility] CATH LAB;  Service: Cardiovascular;  Laterality: N/A;  . left leg stent     . POLYPECTOMY  04/29/2020   Procedure: POLYPECTOMY;  Surgeon: Wilford Corner, MD;  Location: [facility] ENDOSCOPY;  Service: Endoscopy;;    Social History   Socioeconomic History  . Marital status: Single    Spouse name: Not on file  . Number of children: 3  . Years of education: 78  .  Highest education level: Not on file  Occupational History  . Occupation: Disabled  Tobacco Use  . Smoking status: Never Smoker  . Smokeless tobacco: Never Used  Vaping Use  . Vaping Use: Never used  Substance and Sexual Activity  . Alcohol use: No  . Drug use: No  . Sexual activity: Not on file  Other Topics Concern  . Not on file  Social History Narrative   Independent and ambulatory with cane.   Lives at home alone.   Right-handed.   1-2 cups caffeine per day.   Social Determinants of Health   Financial Resource Strain: Not on file  Food Insecurity: Not on file  Transportation Needs: Not on file  Physical Activity: Not on file  Stress: Not on file  Social Connections: Not on file  Intimate Partner Violence: Not on file    Family History  Problem Relation Age of Onset  . Cancer Father   . Hypertension Mother   . Diabetes Mother   . Breast cancer Mother   . Hypertension Brother   . Diabetes Brother   . Hypertension Sister   . Diabetes Sister     ROS: no fevers or chills, productive cough, hemoptysis, dysphasia, odynophagia, melena, hematochezia, dysuria, hematuria, rash, seizure activity, orthopnea, PND, pedal edema, claudication. Remaining systems are negative.  Physical Exam: Well-developed morbidly obese in no  acute distress.  Skin is warm and dry.  HEENT is normal.  Neck is supple.  Chest is clear to auscultation with normal expansion.  Cardiovascular exam is regular rate and rhythm.  Abdominal exam nontender or distended. No masses palpated. Extremities show no edema. neuro grossly intact  ECG-October 15, 2020-sinus rhythm with no ST changes.  Personally reviewed  A/P  1 coronary artery disease-continue aspirin and statin.  2 chronic chest pain-no symptoms at present.  We will not pursue further ischemia evaluation at this point.  3 recent syncope-felt to be orthostatic mediated.  Continue lower dose Lasix.  4 hypertension-blood pressure is controlled.  Continue present medications.  5 chronic diastolic congestive heart failure-as outlined patient recently admitted with orthostatic syncope.  We will continue on lower dose Lasix and follow.  I have asked him to weigh daily and he will take an additional 40 mg for weight gain of 2 to 3 pounds.  Check potassium and renal function.  6 carotid artery disease-mild on most recent Dopplers.  Continue aspirin and statin.  7 morbid obesity-we again discussed the importance of diet, exercise and weight loss.  8 history of dyspnea-this is felt to be multifactorial including obesity hypoventilation syndrome, obstructive sleep apnea and chronic diastolic congestive heart failure.  Kirk Ruths, MD

## 2020-10-21 DIAGNOSIS — S0121XA Laceration without foreign body of nose, initial encounter: Secondary | ICD-10-CM | POA: Insufficient documentation

## 2020-10-21 DIAGNOSIS — R040 Epistaxis: Secondary | ICD-10-CM | POA: Insufficient documentation

## 2020-10-27 ENCOUNTER — Ambulatory Visit (INDEPENDENT_AMBULATORY_CARE_PROVIDER_SITE_OTHER): Payer: Medicaid Other | Admitting: Cardiology

## 2020-10-27 ENCOUNTER — Other Ambulatory Visit: Payer: Self-pay

## 2020-10-27 ENCOUNTER — Encounter: Payer: Self-pay | Admitting: Cardiology

## 2020-10-27 VITALS — BP 142/64 | HR 76 | Ht 70.0 in | Wt 328.6 lb

## 2020-10-27 DIAGNOSIS — I5032 Chronic diastolic (congestive) heart failure: Secondary | ICD-10-CM | POA: Diagnosis not present

## 2020-10-27 DIAGNOSIS — I2510 Atherosclerotic heart disease of native coronary artery without angina pectoris: Secondary | ICD-10-CM

## 2020-10-27 DIAGNOSIS — E785 Hyperlipidemia, unspecified: Secondary | ICD-10-CM

## 2020-10-27 DIAGNOSIS — I10 Essential (primary) hypertension: Secondary | ICD-10-CM

## 2020-10-27 NOTE — Patient Instructions (Signed)
Medication Instructions:   NO CHANGE  *If you need a refill on your cardiac medications before your next appointment, please call your pharmacy*   Lab Work:  Your physician recommends that you HAVE LAB WORK TODAY  If you have labs (blood work) drawn today and your tests are completely normal, you will receive your results only by: Marland Kitchen MyChart Message (if you have MyChart) OR . A paper copy in the mail If you have any lab test that is abnormal or we need to change your treatment, we will call you to review the results.   Follow-Up: At [facility], you and your health needs are our priority.  As part of our continuing mission to provide you with exceptional heart care, we have created designated Provider Care Teams.  These Care Teams include your primary Cardiologist (physician) and Advanced Practice Providers (APPs -  Physician Assistants and Nurse Practitioners) who all work together to provide you with the care you need, when you need it.  We recommend signing up for the patient portal called "MyChart".  Sign up information is provided on this After Visit Summary.  MyChart is used to connect with patients for Virtual Visits (Telemedicine).  Patients are able to view lab/test results, encounter notes, upcoming appointments, etc.  Non-urgent messages can be sent to your provider as well.   To learn more about what you can do with MyChart, go to [email or web address].    Your next appointment:   3 month(s)  The format for your next appointment:   In Person  Provider:   You will see one of the following Advanced Practice Providers on your designated Care Team:    Corine Shelter, PA-C  [place], [place]  Edd Fabian, [place]  Then, Olga Millers, MD will plan to see you again in 6 month(s).

## 2020-11-02 LAB — BASIC METABOLIC PANEL
BUN/Creatinine Ratio: 15 (ref 9–20)
BUN: 13 mg/dL (ref 6–24)
CO2: 21 mmol/L (ref 20–29)
Calcium: 9.6 mg/dL (ref 8.7–10.2)
Chloride: 101 mmol/L (ref 96–106)
Creatinine, Ser: 0.89 mg/dL (ref 0.76–1.27)
GFR calc Af Amer: 110 mL/min/{1.73_m2} (ref >59)
GFR calc non Af Amer: 95 mL/min/{1.73_m2} (ref >59)
Glucose: 184 mg/dL — ABNORMAL HIGH (ref 65–99)
Potassium: 4.2 mmol/L (ref 3.5–5.2)
Sodium: 140 mmol/L (ref 134–144)

## 2020-11-03 ENCOUNTER — Encounter: Payer: Self-pay | Admitting: *Deleted

## 2020-11-06 ENCOUNTER — Other Ambulatory Visit: Payer: Self-pay | Admitting: Adult Health

## 2020-11-18 ENCOUNTER — Other Ambulatory Visit: Payer: Self-pay | Admitting: Physician Assistant

## 2020-11-18 DIAGNOSIS — K760 Fatty (change of) liver, not elsewhere classified: Secondary | ICD-10-CM

## 2020-11-19 ENCOUNTER — Telehealth: Payer: Self-pay | Admitting: *Deleted

## 2020-11-19 NOTE — Telephone Encounter (Signed)
   [facility] Pre-operative Risk Assessment      Request for surgical clearance:  1. What type of surgery is being performed?  COLONOSCOPY ( FOR RECTAL BLEEDING/HISTORY COLON POLYPS  2. When is this surgery scheduled? 12/31/2020  3. What type of clearance is required (medical clearance vs. Pharmacy clearance to hold med vs. Both)? MEDICAL  4. Are there any medications that need to be held prior to surgery and how long? ASPIRIN  5. Practice name and name of physician performing surgery? [facility]; DR SCHOOLER  6. What is the office phone number? [phone]   7.   What is the office fax number? [phone]  8.   Anesthesia type (None, local, MAC, general) ? PROPOFOL   [name] 11/19/2020, 12:24 PM  _________________________________________________________________   (provider comments below)

## 2020-11-19 NOTE — Telephone Encounter (Signed)
   Primary Cardiologist: Kirk Ruths, MD  Chart reviewed as part of pre-operative protocol coverage. Given past medical history and time since last visit, based on ACC/AHA guidelines, [name] would be at acceptable risk for the planned procedure without further cardiovascular testing.   His aspirin may be held for 5-7 days prior to his procedure.  Please resume as soon as hemostasis is achieved  I will route this recommendation to the requesting party via Epic fax function and remove from pre-op pool.  Please call with questions.  Jossie Ng. Gervis Gaba NP-C    11/19/2020, 12:39 PM [address] Office [phone] Fax [phone]

## 2020-12-02 ENCOUNTER — Ambulatory Visit
Admission: RE | Admit: 2020-12-02 | Discharge: 2020-12-02 | Disposition: A | Discharge status: Home / Self Care | Payer: Medicaid Other | Source: Ambulatory Visit | Attending: Physician Assistant | Admitting: Physician Assistant

## 2020-12-02 DIAGNOSIS — K760 Fatty (change of) liver, not elsewhere classified: Secondary | ICD-10-CM

## 2020-12-17 ENCOUNTER — Ambulatory Visit
Admission: RE | Admit: 2020-12-17 | Discharge: 2020-12-17 | Disposition: A | Discharge status: Home / Self Care | Payer: Medicaid Other | Source: Ambulatory Visit | Attending: Physician Assistant | Admitting: Physician Assistant

## 2020-12-17 ENCOUNTER — Other Ambulatory Visit: Payer: Self-pay

## 2020-12-18 ENCOUNTER — Ambulatory Visit (INDEPENDENT_AMBULATORY_CARE_PROVIDER_SITE_OTHER): Payer: Medicaid Other | Admitting: Internal Medicine

## 2020-12-18 ENCOUNTER — Encounter: Payer: Self-pay | Admitting: Internal Medicine

## 2020-12-18 ENCOUNTER — Ambulatory Visit: Payer: Medicaid Other | Admitting: Internal Medicine

## 2020-12-18 VITALS — BP 118/70 | HR 79 | Temp 97.0°F | Ht 70.0 in | Wt 328.0 lb

## 2020-12-18 DIAGNOSIS — E781 Pure hyperglyceridemia: Secondary | ICD-10-CM

## 2020-12-18 DIAGNOSIS — E785 Hyperlipidemia, unspecified: Secondary | ICD-10-CM

## 2020-12-18 NOTE — Progress Notes (Signed)
LIPID OFFICE NOTE  Chief Complaint:  Follow-up lipids  Primary Care Physician: [name], FNP  Primary Cardiologist:  Kirk Ruths, MD  HPI:  [name] is a 60 y.o. male who is being seen today for the evaluation of hypertriglyceridemia at the request of [name], FNP. [name] is a pleasant 60 year old male patient who I previously took care of back in 2015.  In fact he underwent cardiac catheterization by myself which showed just moderate LAD disease at the time.  He had been managed medically for this.  Other comorbidities include morbid obesity, insulin-dependent diabetes with recently worse blood sugar controlled hemoglobin A1c 9.8.  He has dyslipidemia with persistently elevated triglycerides which has ranged between 202 and 250 on treatment with atorvastatin 80 mg, however recently a repeat lipid profile showed markdly elevated triglycerides near 600.  Over the past year he is suffered from chronic pain and is received steroids, he also has a history of pancreatitis in the past.  Is not clear whether this was related to elevated triglycerides or not.  He ultimately had progression of coronary disease and received a stent to the LAD in August 2017 and transition care over to Dr. Stanford Breed.  He is currently referred today for evaluation and management of elevated triglycerides.   08/24/2020  [name] is seen today in follow-up.  Over the summer he was hospitalized for DKA/hyperglycemia.  He was hypotensive and he was concerned about possible anaphylaxis as he had just started Vascepa.  He also mentioned that he thought that it increased his blood sugar however there is little evidence of this.  According to the hospitalist physician Dr. Broadus John, the medication was held pending follow-up with me.  Subsequently had been admitted several times with hyperglycemia, infection and other issues including chest pain.  Hemoglobin A1c is 0 poorly controlled at 11.6 despite  being on insulin at this point.  Triglycerides again were rechecked at 529.  02/12/2020  [name] returns for follow-up.  He has had some improvement in his triglycerides.  Recent lipids show total cholesterol 113, triglycerides 325, HDL 33 and LDL of 32.  Triglycerides are down from 529 and the LDL is further decreased from 51.  He reports some improvement in his glycemic control.  06/18/2020  [name] is seen today in follow-up.  He was doing well with his triglycerides however recently was admitted had some GI bleeding issues.  Hemoglobin A1c has been trending up all the way to 12.4.  He said he had some issues with his insulin is not working together and his endocrinologist switched him out.  Since then apparently his numbers are better but his labs yesterday showed very poorly controlled triglycerides.  Total cholesterol was 210, triglycerides 1849 and HDL 24.  LDL cannot be calculated due to the significant lipemia.   12/18/2020  [name] is seen today in follow-up.  Unfortunately he has not had reassessment of his triglycerides.  His last lab work however showed significantly elevated triglycerides 1849 in August 2021.  He has been on atorvastatin and fenofibrate, but unfortunately cannot tolerate Vascepa due to headaches chest pain and symptoms similar to his prior stroke.  We discussed several other options for him and I would like him to be evaluated for the core trial.  I do suspect a lot of his triglycerides are related to persistent hyperglycemia however his A1c has come down about 2% but remains above 10% as of December 23.  PMHx:  Past Medical History:  Diagnosis Date   Anemia    Arthritis    Back pain    CAD (coronary artery disease)    a. s/p DES to LAD in 05/2016   Cervical radiculopathy    Chronic diastolic CHF (congestive heart failure) (HCC)    Chronic pain    Depression    DVT (deep venous thrombosis) (HCC)    Hematemesis    Hepatic steatosis     Hyperlipidemia    Hypertension    IBS (irritable bowel syndrome)    Morbid obesity (HCC)    OSA (obstructive sleep apnea)    Pancreatitis    PE (pulmonary thromboembolism) ([place])    Peripheral neuropathy    PUD (peptic ulcer disease)    Renal disorder    Stroke [facility])    a. ?details unclear - not seen on imaging when he was admitted in 05/2017 for TIA symptoms which were felt due to cervical radiculopathy.   Thoracic aortic ectasia (HCC)    a. 4.3cm ectatic ascending thoracic aorta by CT 06/2017.    Type 2 diabetes mellitus ([place])     Past Surgical History:  Procedure Laterality Date   CARDIAC CATHETERIZATION N/A 05/31/2016   Procedure: Left Heart Cath and Coronary Angiography;  Surgeon: Peter M Martinique, MD;  Location: [place] [facility];  Service: Cardiovascular;  Laterality: N/A;   CARDIAC CATHETERIZATION N/A 05/31/2016   Procedure: Intravascular Pressure Wire/FFR Study;  Surgeon: Peter M Martinique, MD;  Location: [place] [facility];  Service: Cardiovascular;  Laterality: N/A;   CARDIAC CATHETERIZATION N/A 05/31/2016   Procedure: Coronary Stent Intervention;  Surgeon: Peter M Martinique, MD;  Location: [place] [facility];  Service: Cardiovascular;  Laterality: N/A;   COLONOSCOPY WITH PROPOFOL N/A 04/29/2020   Procedure: COLONOSCOPY WITH PROPOFOL;  Surgeon: Wilford Corner, MD;  Location: [place];  Service: Endoscopy;  Laterality: N/A;   ESOPHAGOGASTRODUODENOSCOPY N/A 04/29/2020   Procedure: ESOPHAGOGASTRODUODENOSCOPY (EGD);  Surgeon: Wilford Corner, MD;  Location: [place];  Service: Endoscopy;  Laterality: N/A;   LEFT HEART CATH AND CORONARY ANGIOGRAPHY N/A 12/08/2017   Procedure: LEFT HEART CATH AND CORONARY ANGIOGRAPHY;  Surgeon: Leonie Man, MD;  Location: [place] [facility];  Service: Cardiovascular;  Laterality: N/A;   LEFT HEART CATHETERIZATION WITH CORONARY ANGIOGRAM N/A 02/03/2014   Procedure: LEFT HEART CATHETERIZATION WITH CORONARY ANGIOGRAM;  Surgeon:  Pixie Casino, MD;  Location: [facility] CATH LAB;  Service: Cardiovascular;  Laterality: N/A;   left leg stent      POLYPECTOMY  04/29/2020   Procedure: POLYPECTOMY;  Surgeon: Wilford Corner, MD;  Location: [facility] ENDOSCOPY;  Service: Endoscopy;;    FAMHx:  Family History  Problem Relation Age of Onset   Cancer Father    Hypertension Mother    Diabetes Mother    Breast cancer Mother    Hypertension Brother    Diabetes Brother    Hypertension Sister    Diabetes Sister     SOCHx:   reports that he has never smoked. He has never used smokeless tobacco. He reports that he does not drink alcohol and does not use drugs.  ALLERGIES:  Allergies  Allergen Reactions   Coconut Flavor [Flavoring Agent] Hives   Coconut Oil Hives   Ibuprofen Other (See Comments)    Made gastric ulcers worse   Aleve [Naproxen] Other (See Comments)    DUE TO KIDNEYS   Nsaids Other (See Comments)    Stomach ulcers    Vascepa [Icosapent Ethyl] Other (See  Comments)    headaches, chest pain, similar to sx of a stroke, hypotension     ROS: Pertinent items noted in HPI and remainder of comprehensive ROS otherwise negative.  HOME MEDS: Current Outpatient Medications on File Prior to Visit  Medication Sig Dispense Refill   Accu-Chek FastClix Lancets MISC 1 Device by Other route 2 (two) times daily.      albuterol (VENTOLIN HFA) 108 (90 Base) MCG/ACT inhaler Inhale 2 puffs into the lungs every 6 (six) hours as needed for wheezing or shortness of breath. As Directed     allopurinol (ZYLOPRIM) 100 MG tablet Take 100 mg by mouth daily.     aspirin 81 MG chewable tablet Chew 1 tablet (81 mg total) by mouth daily. 30 tablet 0   atorvastatin (LIPITOR) 40 MG tablet Take 40 mg by mouth daily. 1 tablet at bedtime     carvedilol (COREG) 12.5 MG tablet Take 12.5 mg by mouth 2 (two) times daily.     diclofenac (FLECTOR) 1.3 % PTCH Place 1 patch onto the skin daily as needed for pain.     DULoxetine  (CYMBALTA) 60 MG capsule Take 1 capsule (60 mg total) by mouth daily. Please request future refills from PCP. 30 capsule 1   famotidine (PEPCID) 20 MG tablet Take 20 mg by mouth daily.     fenofibrate (TRICOR) 145 MG tablet Take 1 tablet (145 mg total) by mouth daily. 90 tablet 3   furosemide (LASIX) 40 MG tablet Take 1 tablet (40 mg total) by mouth daily. PLEASE RESUME ONLY FROM 10/21/2020. DOSE HAS BEEN CHANGED. 90 tablet 0   gabapentin (NEURONTIN) 400 MG capsule TAKE 1 (ONE) CAPSULE THREE TIMES DAILY FOR NEUROPATHY     glipiZIDE (GLUCOTROL) 10 MG tablet Take 10 mg by mouth 2 (two) times daily.  4   HUMULIN R U-500 KWIKPEN 500 UNIT/ML kwikpen Inject 0-75 Units into the skin See admin instructions. Inject under the skin three times daily as directed or sliding scale: 65 units in the morning, 70 units at lunch, 75 units at bedtime     Insulin Pen Needle 31G X 5 MM MISC Use 1 needle daily to inject insulin as prescribed (Patient taking differently: 1 each. Use 1 needle daily to inject insulin as prescribed) 100 each 2   isosorbide mononitrate (IMDUR) 30 MG 24 hr tablet TAKE 1 TABLET BY MOUTH DAILY 28 tablet 1   lisinopril (ZESTRIL) 5 MG tablet Take 5 mg by mouth daily.     nitroGLYCERIN (NITROSTAT) 0.4 MG SL tablet Place 1 tablet (0.4 mg total) under the tongue every 5 (five) minutes as needed for chest pain. 100 tablet 1   ondansetron (ZOFRAN) 4 MG tablet Take 4 mg by mouth 2 (two) times daily as needed for nausea/vomiting.     oxyCODONE (ROXICODONE) 15 MG immediate release tablet Take 15 mg 3 (three) times daily as needed by mouth for pain.   0   pantoprazole (PROTONIX) 40 MG tablet Take 40 mg by mouth daily.     potassium chloride (MICRO-K) 10 MEQ CR capsule Take 10 mEq by mouth in the morning and at bedtime.     sertraline (ZOLOFT) 50 MG tablet Take 50 mg by mouth daily.     tiZANidine (ZANAFLEX) 4 MG tablet Take 4 mg by mouth at bedtime as needed for muscle spasms.     traZODone  (DESYREL) 50 MG tablet Take 1 tablet (50 mg total) by mouth at bedtime. 30 tablet 11   Vitamin  D, Ergocalciferol, (DRISDOL) 1.25 MG (50000 UT) CAPS capsule Take 50,000 Units by mouth every Monday.     No current facility-administered medications on file prior to visit.    LABS/IMAGING: No results found for this or any previous visit (from the past 48 hour(s)). No results found.  LIPID PANEL:    Component Value Date/Time   CHOL 210 (H) 06/17/2020 0854   TRIG 1,849 (HH) 06/17/2020 0854   HDL 24 (L) 06/17/2020 0854   CHOLHDL 8.8 (H) 06/17/2020 0854   CHOLHDL 4.3 03/08/2019 1008   VLDL 43 (H) 12/08/2017 0212   LDLCALC Comment (A) 06/17/2020 0854   LDLCALC  03/08/2019 1008     Comment:     . LDL cholesterol not calculated. Triglyceride levels greater than 400 mg/dL invalidate calculated LDL results. . Reference range: <100 . Desirable range <100 mg/dL for primary prevention;   <70 mg/dL for patients with CHD or diabetic patients  with > or = 2 CHD risk factors. Marland Kitchen LDL-C is now calculated using the Martin-Hopkins  calculation, which is a validated novel method providing  better accuracy than the Friedewald equation in the  estimation of LDL-C.  Cresenciano Genre et al. Annamaria Helling. 9983;382(50): 2061-2068  (http://education.[employer].com/faq/FAQ164)    LDLDIRECT 48 02/10/2020 0929    WEIGHTS: Wt Readings from Last 3 Encounters:  12/18/20 (!) 328 lb (148.8 kg)  10/27/20 (!) 328 lb 9.6 oz (149.1 kg)  10/17/20 (!) 314 lb 13.1 oz (142.8 kg)    VITALS: BP 118/70    Pulse 79    Temp (!) 97 F (36.1 C)    Ht 5\' 10"  (1.778 m)    Wt (!) 328 lb (148.8 kg)    SpO2 95%    BMI 47.06 kg/m   EXAM: Deferred  EKG: Deferred  ASSESSMENT: 1. Coronary artery disease status post PCI to the LAD (2017) 2. Insulin-dependent diabetes-uncontrolled, hemoglobin A1c 11.6 3. Morbid obesity with recent weight loss 4. Hypertension 5. Mixed dyslipidemia with very high triglycerides 6. Intermittent  prescription steroid use 7. History of pancreatitis  PLAN: 1.   Mr. Schewe had persistently elevated triglycerides.  We will need to repeat his labs fasting, likely next week.  I will refer him on to be evaluated for the CORE trial which evaluates the antisense oligonucleotide to ApoCIII.  Hopefully this will be additionally beneficial for lowering his triglycerides.  Pixie Casino, MD, [facility], [place] Director of the Advanced Lipid Disorders &  Cardiovascular Risk Reduction Clinic Diplomate of the American Board of Clinical Lipidology Attending Cardiologist  Direct Dial: [phone]   Fax: [phone]  Website:  [email or web address] Gloris Shiroma 12/18/2020, 11:28 AM

## 2020-12-18 NOTE — Patient Instructions (Signed)
Medication Instructions:  Your physician recommends that you continue on your current medications as directed. Please refer to the Current Medication list given to you today.  *If you need a refill on your cardiac medications before your next appointment, please call your pharmacy*   Lab Work: Please return for FASTING labs (Lipid, direct LDL)  Our in office lab hours are Monday-Friday 8:00-4:00, closed for lunch 12:45-1:45 pm.  No appointment needed.  Follow-Up: At [facility], you and your health needs are our priority.  As part of our continuing mission to provide you with exceptional heart care, we have created designated Provider Care Teams.  These Care Teams include your primary Cardiologist (physician) and Advanced Practice Providers (APPs -  Physician Assistants and Nurse Practitioners) who all work together to provide you with the care you need, when you need it.  We recommend signing up for the patient portal called "MyChart".  Sign up information is provided on this After Visit Summary.  MyChart is used to connect with patients for Virtual Visits (Telemedicine).  Patients are able to view lab/test results, encounter notes, upcoming appointments, etc.  Non-urgent messages can be sent to your provider as well.   To learn more about what you can do with MyChart, go to [email or web address].    Your next appointment:   6 month(s)  The format for your next appointment:   In Person  Provider:   K. Mali Hilty, MD-lipid clinic

## 2020-12-22 LAB — LIPID PANEL
Chol/HDL Ratio: 4.3 ratio (ref 0.0–5.0)
Cholesterol, Total: 126 mg/dL (ref 100–199)
HDL: 29 mg/dL — ABNORMAL LOW (ref >39)
LDL Chol Calc (NIH): 37 mg/dL (ref 0–99)
Triglycerides: 418 mg/dL — ABNORMAL HIGH (ref 0–149)
VLDL Cholesterol Cal: 60 mg/dL — ABNORMAL HIGH (ref 5–40)

## 2020-12-22 LAB — LDL CHOLESTEROL, DIRECT: LDL Direct: 46 mg/dL (ref 0–99)

## 2020-12-24 ENCOUNTER — Other Ambulatory Visit: Payer: Self-pay | Admitting: Adult Health

## 2020-12-24 ENCOUNTER — Encounter: Payer: Self-pay | Admitting: Internal Medicine

## 2021-01-15 ENCOUNTER — Other Ambulatory Visit: Payer: Self-pay

## 2021-01-15 ENCOUNTER — Ambulatory Visit (INDEPENDENT_AMBULATORY_CARE_PROVIDER_SITE_OTHER): Payer: Medicaid Other | Admitting: Podiatry

## 2021-01-15 ENCOUNTER — Encounter: Payer: Self-pay | Admitting: Podiatry

## 2021-01-15 DIAGNOSIS — E1142 Type 2 diabetes mellitus with diabetic polyneuropathy: Secondary | ICD-10-CM

## 2021-01-15 DIAGNOSIS — B351 Tinea unguium: Secondary | ICD-10-CM

## 2021-01-15 DIAGNOSIS — M79674 Pain in right toe(s): Secondary | ICD-10-CM | POA: Insufficient documentation

## 2021-01-15 DIAGNOSIS — N179 Acute kidney failure, unspecified: Secondary | ICD-10-CM | POA: Diagnosis not present

## 2021-01-15 DIAGNOSIS — Z860100 Personal history of colon polyps, unspecified: Secondary | ICD-10-CM | POA: Insufficient documentation

## 2021-01-15 DIAGNOSIS — K625 Hemorrhage of anus and rectum: Secondary | ICD-10-CM | POA: Insufficient documentation

## 2021-01-15 DIAGNOSIS — M79675 Pain in left toe(s): Secondary | ICD-10-CM | POA: Diagnosis not present

## 2021-01-15 NOTE — Progress Notes (Signed)
This patient presents  to my office for at risk foot care.  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  This patient presents for at risk foot care today.  General Appearance  Alert, conversant and in no acute stress.  Vascular  Dorsalis pedis   pulses are palpable  bilaterally. Posterior tibial pulses are absent due to swelling. Capillary return is within normal limits  bilaterally. Temperature is within normal limits  bilaterally.  Neurologic  Senn-Weinstein monofilament wire test within normal limits  bilaterally. Muscle power within normal limits bilaterally.  Nails Thick disfigured discolored nails with subungual debris  from hallux to fifth toes bilaterally. No evidence of bacterial infection or drainage bilaterally.  Orthopedic  No limitations of motion  feet .  No crepitus or effusions noted.  No bony pathology or digital deformities noted.  Skin  normotropic skin with no porokeratosis noted bilaterally.  No signs of infections or ulcers noted.     Onychomycosis  Pain in right toes  Pain in left toes  Consent was obtained for treatment procedures.   Mechanical debridement of nails 1-5  bilaterally performed with a nail nipper.  Filed with dremel without incident.    Return office visit    3 months                 Told patient to return for periodic foot care and evaluation due to potential at risk complications.   Gardiner Barefoot DPM

## 2021-01-25 NOTE — Progress Notes (Signed)
Cardiology Clinic Note   Patient Name: [name] Date of Encounter: 01/26/2021  Primary Care Provider:  [name], FNP Primary Cardiologist:  Kirk Ruths, MD  Patient Profile    [name]. [name] 60 year old male presents the clinic today for follow-up of his hypertension and coronary artery disease.  Past Medical History    Past Medical History:  Diagnosis Date  . Anemia   . Arthritis   . Back pain   . CAD (coronary artery disease)    a. s/p DES to LAD in 05/2016  . Cervical radiculopathy   . Chronic diastolic CHF (congestive heart failure) ([place])   . Chronic pain   . Depression   . DVT (deep venous thrombosis) ([place])   . Hematemesis   . Hepatic steatosis   . Hyperlipidemia   . Hypertension   . IBS (irritable bowel syndrome)   . Morbid obesity ([place])   . OSA (obstructive sleep apnea)   . Pancreatitis   . PE (pulmonary thromboembolism) ([place])   . Peripheral neuropathy   . PUD (peptic ulcer disease)   . Renal disorder   . Stroke [facility])    a. ?details unclear - not seen on imaging when he was admitted in 05/2017 for TIA symptoms which were felt due to cervical radiculopathy.  . Thoracic aortic ectasia (H[facility])    a. 4.3cm ectatic ascending thoracic aorta by CT 06/2017.   . Type 2 diabetes mellitus ([place])    Past Surgical History:  Procedure Laterality Date  . CARDIAC CATHETERIZATION N/A 05/31/2016   Procedure: Left Heart Cath and Coronary Angiography;  Surgeon: Peter M Martinique, MD;  Location: [place] CV LAB;  Service: Cardiovascular;  Laterality: N/A;  . CARDIAC CATHETERIZATION N/A 05/31/2016   Procedure: Intravascular Pressure Wire/FFR Study;  Surgeon: Peter M Martinique, MD;  Location: [place] CV LAB;  Service: Cardiovascular;  Laterality: N/A;  . CARDIAC CATHETERIZATION N/A 05/31/2016   Procedure: Coronary Stent Intervention;  Surgeon: Peter M Martinique, MD;  Location: [place] CV LAB;  Service: Cardiovascular;  Laterality: N/A;  . COLONOSCOPY WITH PROPOFOL  N/A 04/29/2020   Procedure: COLONOSCOPY WITH PROPOFOL;  Surgeon: Wilford Corner, MD;  Location: [place];  Service: Endoscopy;  Laterality: N/A;  . ESOPHAGOGASTRODUODENOSCOPY N/A 04/29/2020   Procedure: ESOPHAGOGASTRODUODENOSCOPY (EGD);  Surgeon: Wilford Corner, MD;  Location: [place];  Service: Endoscopy;  Laterality: N/A;  . LEFT HEART CATH AND CORONARY ANGIOGRAPHY N/A 12/08/2017   Procedure: LEFT HEART CATH AND CORONARY ANGIOGRAPHY;  Surgeon: Leonie Man, MD;  Location: [place] CV LAB;  Service: Cardiovascular;  Laterality: N/A;  . LEFT HEART CATHETERIZATION WITH CORONARY ANGIOGRAM N/A 02/03/2014   Procedure: LEFT HEART CATHETERIZATION WITH CORONARY ANGIOGRAM;  Surgeon: Pixie Casino, MD;  Location: [facility] CATH LAB;  Service: Cardiovascular;  Laterality: N/A;  . left leg stent     . POLYPECTOMY  04/29/2020   Procedure: POLYPECTOMY;  Surgeon: Wilford Corner, MD;  Location: [facility] ENDOSCOPY;  Service: Endoscopy;;    Allergies  Allergies  Allergen Reactions  . Coconut Flavor [Flavoring Agent] Hives  . Coconut Oil Hives  . Ibuprofen Other (See Comments)    Made gastric ulcers worse  . Aleve [Naproxen] Other (See Comments)    DUE TO KIDNEYS  . Nsaids Other (See Comments)    Stomach ulcers   . Vascepa [Icosapent Ethyl] Other (See Comments)    headaches, chest pain, similar to sx of a stroke, hypotension     History of Present Illness  Mr. Haroon has a PMH of hypertension, type 2 diabetes, coronary artery disease, DVT, cerebral embolism with cerebral infarct, TIA, chronic diastolic CHF, and hypertensive urgency.  He received PCI and DES to his LAD 8/17.  CTA 9/17 showed no pulmonary embolus.  Cardiac catheterization 2/19 showed patent stents and normal LV function.  His LVEDP was 27 mmHg.  Carotid Dopplers 4/19 showed 1-39% right and nearly normal left carotid.  CTA August 2019 showed no thoracic aortic aneurysm.  Echocardiogram 6/20 showed normal LV function, mild LVH,  G1 DD and mildly dilated aortic root.  He was last seen by Dr. Stanford Breed on 01/14/2020.  During that time he described occasional dyspnea and chest pain which was chronic.  This typically occurred with activities.  He was exercising on the elliptical, exercise bike and treadmill and stated that he had no chest pain with these activities.  He had no recurrent syncopal events.  He follows with Dr. Debara Pickett for management of his lipids  He contacted nurse triage line on 07/15/2020 and indicated that he was having chest discomfort and increased shortness of breath.  He was instructed to take his furosemide 80 mg 3 times daily x2 days.  He presented to the clinic 07/17/20 for follow-up evaluation stated he was worried about becoming dehydrated.  He increased his fluid consumption and only took extra Lasix in the morning and afternoon for the last 3 days.  He stated that he had not been able to be as physically active and was only using his elliptical and stationary bike 1 times per week.  I encouraged him to start chewing sugar-free gum and using sugar-free candy to help with his thirst.  I  started him on a 64 ounce fluid restriction, gave him the salty 6 diet sheet with 1800 mg sodium recommendation, continued  Daily weights and planned follow-up in 3 months.  He was seen by Dr. Stanford Breed on 10/27/2020.  His echocardiogram 12/21 showed LV function was normal, mild left ventricular enlargement.  He was felt to be having orthostatic episodes.  Due to his syncopal episodes his furosemide was decreased from 40 mg 3 times daily to 40 mg daily.  He denied dyspnea, chest pain, palpitations, and had no lower extremity edema.  His potassium at that time was 4.2 with a creatinine of 0.89.  He presents to the clinic today for follow-up evaluation states he feels well.  He reports that he is exercising every other day doing elliptical exercise bike and lifting weights.  He does notice some increased shortness of breath with  increased physical activity but no increased shortness of breath with normal activities.  We reviewed his weight he has lost almost 15 pounds.  He has been following a low-sodium heart healthy diet.  His blood pressure is slightly elevated initially today at 145/77 but on recheck it is 134/68.  He reports that he has been compliant with his medications and has not had any side effects.  I will have him continue his heart healthy low-sodium diet, maintain his physical activity, and follow-up in 6 months.  Today he denies chest pain, shortness of breath, lower extremity edema, fatigue, palpitations, melena, hematuria, hemoptysis, diaphoresis, weakness, presyncope, syncope, orthopnea, and PND.  Home Medications    Prior to Admission medications   Medication Sig Start Date End Date Taking? Authorizing Provider  Accu-Chek FastClix Lancets MISC 1 Device by Other route 2 (two) times daily.  03/20/19   [provider]  ACCU-CHEK GUIDE test strip  01/08/21  [provider]  albuterol (VENTOLIN HFA) 108 (90 Base) MCG/ACT inhaler Inhale 2 puffs into the lungs every 6 (six) hours as needed for wheezing or shortness of breath. As Directed    [provider]  allopurinol (ZYLOPRIM) 100 MG tablet Take 100 mg by mouth daily. 03/31/20   [provider]  aspirin 81 MG chewable tablet Chew 1 tablet (81 mg total) by mouth daily. 06/01/16   Elwin Mocha, MD  atorvastatin (LIPITOR) 40 MG tablet Take 40 mg by mouth daily. 1 tablet at bedtime    [provider]  carvedilol (COREG) 12.5 MG tablet Take 12.5 mg by mouth 2 (two) times daily. 10/22/20   [provider]  diclofenac (FLECTOR) 1.3 % PTCH Place 1 patch onto the skin daily as needed for pain. 03/02/20   [provider]  famotidine (PEPCID) 20 MG tablet Take 20 mg by mouth daily. 03/05/20   [provider]  fenofibrate (TRICOR) 145 MG tablet Take 1 tablet (145 mg total) by mouth daily. 06/18/20    Hilty, Nadean Corwin, MD  furosemide (LASIX) 40 MG tablet Take 1 tablet (40 mg total) by mouth daily. PLEASE RESUME ONLY FROM 10/21/2020. DOSE HAS BEEN CHANGED. 10/21/20 10/21/21  Bonnielee Haff, MD  gabapentin (NEURONTIN) 400 MG capsule TAKE 1 (ONE) CAPSULE THREE TIMES DAILY FOR NEUROPATHY 11/24/20   [provider]  glipiZIDE (GLUCOTROL) 10 MG tablet Take 10 mg by mouth 2 (two) times daily. 05/30/17   [provider]  HUMULIN R U-500 KWIKPEN 500 UNIT/ML kwikpen Inject 0-75 Units into the skin See admin instructions. Inject under the skin three times daily as directed or sliding scale: 65 units in the morning, 70 units at lunch, 75 units at bedtime 06/04/20   [provider]  Insulin Pen Needle 31G X 5 MM MISC Use 1 needle daily to inject insulin as prescribed Patient taking differently: 1 each. Use 1 needle daily to inject insulin as prescribed 06/18/17   Barton Dubois, MD  isosorbide mononitrate (IMDUR) 30 MG 24 hr tablet TAKE 1 TABLET BY MOUTH DAILY 12/24/20   Hilty, Nadean Corwin, MD  lisinopril (ZESTRIL) 5 MG tablet Take 5 mg by mouth daily. 12/04/20   [provider]  nitroGLYCERIN (NITROSTAT) 0.4 MG SL tablet Place 1 tablet (0.4 mg total) under the tongue every 5 (five) minutes as needed for chest pain. 02/06/19   Skeet Latch, MD  ondansetron (ZOFRAN) 4 MG tablet Take 4 mg by mouth 2 (two) times daily as needed for nausea/vomiting. 04/21/20   [provider]  oxyCODONE (ROXICODONE) 15 MG immediate release tablet Take 15 mg 3 (three) times daily as needed by mouth for pain.  08/28/17   [provider]  polyethylene glycol-electrolytes (NULYTELY) 420 g solution DISPENSE ONE FOUR LITER CONTAINER. USE AS DIRECTED 12/22/20   [provider]  potassium chloride (MICRO-K) 10 MEQ CR capsule Take 10 mEq by mouth in the morning and at bedtime. 04/03/20   [provider]  sertraline (ZOLOFT) 50 MG tablet Take 50 mg by mouth daily. 12/17/20    [provider]  tiZANidine (ZANAFLEX) 4 MG tablet Take 4 mg by mouth at bedtime as needed for muscle spasms. 03/31/20   [provider]  traZODone (DESYREL) 50 MG tablet Take 1 tablet (50 mg total) by mouth at bedtime. 12/15/16   Florinda Marker, MD  Vitamin D, Ergocalciferol, (DRISDOL) 1.25 MG (50000 UT) CAPS capsule Take 50,000 Units by mouth every Monday. 08/15/19  [provider]    Family History    Family History  Problem Relation Age of Onset  . Cancer Father   . Hypertension Mother   . Diabetes Mother   . Breast cancer Mother   . Hypertension Brother   . Diabetes Brother   . Hypertension Sister   . Diabetes Sister    He indicated that his mother is alive. He indicated that his father is deceased. He indicated that the status of his sister is unknown. He indicated that the status of his brother is unknown. He indicated that his maternal grandmother is deceased. He indicated that his maternal grandfather is deceased. He indicated that his paternal grandmother is deceased. He indicated that his paternal grandfather is deceased.  Social History    Social History   Socioeconomic History  . Marital status: Single    Spouse name: Not on file  . Number of children: 3  . Years of education: 30  . Highest education level: Not on file  Occupational History  . Occupation: Disabled  Tobacco Use  . Smoking status: Never Smoker  . Smokeless tobacco: Never Used  Vaping Use  . Vaping Use: Never used  Substance and Sexual Activity  . Alcohol use: No  . Drug use: No  . Sexual activity: Not on file  Other Topics Concern  . Not on file  Social History Narrative   Independent and ambulatory with cane.   Lives at home alone.   Right-handed.   1-2 cups caffeine per day.   Social Determinants of Health   Financial Resource Strain: Not on file  Food Insecurity: Not on file  Transportation Needs: Not on file  Physical Activity: Not on file  Stress: Not on  file  Social Connections: Not on file  Intimate Partner Violence: Not on file     Review of Systems    General:  No chills, fever, night sweats or weight changes.  Cardiovascular:  No chest pain, dyspnea on exertion, edema, orthopnea, palpitations, paroxysmal nocturnal dyspnea. Dermatological: No rash, lesions/masses Respiratory: No cough, dyspnea Urologic: No hematuria, dysuria Abdominal:   No nausea, vomiting, diarrhea, bright red blood per rectum, melena, or hematemesis Neurologic:  No visual changes, wkns, changes in mental status. All other systems reviewed and are otherwise negative except as noted above.  Physical Exam    VS:  BP 134/68   Pulse 79   Ht 5\' 10"  (1.778 m)   Wt (!) 326 lb 12.8 oz (148.2 kg)   SpO2 97%   BMI 46.89 kg/m  , BMI Body mass index is 46.89 kg/m. GEN: Well nourished, well developed, in no acute distress. HEENT: normal. Neck: Supple, no JVD, carotid bruits, or masses. Cardiac: RRR, no murmurs, rubs, or gallops. No clubbing, cyanosis, edema.  Radials/DP/PT 2+ and equal bilaterally.  Respiratory:  Respirations regular and unlabored, clear to auscultation bilaterally. GI: Soft, nontender, nondistended, BS + x 4. MS: no deformity or atrophy. Skin: warm and dry, no rash. Neuro:  Strength and sensation are intact. Psych: Normal affect.  Accessory Clinical Findings    Recent Labs: 08/19/2020: ALT 19 08/21/2020: Magnesium 2.1 10/15/2020: B Natriuretic Peptide 40.0 10/17/2020: Hemoglobin 12.5; Platelets 262 11/02/2020: BUN 13; Creatinine, Ser 0.89; Potassium 4.2; Sodium 140   Recent Lipid Panel    Component Value Date/Time   CHOL 126 12/22/2020 0925   TRIG 418 (H) 12/22/2020 0925   HDL 29 (L) 12/22/2020 0925   CHOLHDL 4.3 12/22/2020 0925   CHOLHDL 4.3 03/08/2019 1008  VLDL 43 (H) 12/08/2017 0212   LDLCALC 37 12/22/2020 0925   LDLCALC  03/08/2019 1008     Comment:     . LDL cholesterol not calculated. Triglyceride levels greater than 400  mg/dL invalidate calculated LDL results. . Reference range: <100 . Desirable range <100 mg/dL for primary prevention;   <70 mg/dL for patients with CHD or diabetic patients  with > or = 2 CHD risk factors. Marland Kitchen LDL-C is now calculated using the Martin-Hopkins  calculation, which is a validated novel method providing  better accuracy than the Friedewald equation in the  estimation of LDL-C.  Cresenciano Genre et al. Annamaria Helling. 8099;833(82): 2061-2068  (http://education.[employer].com/faq/FAQ164)    LDLDIRECT 46 12/22/2020 0925    ECG personally reviewed by me today-none today.  EKG 07/17/2020 normal sinus rhythm inferior infarct undetermined age 78 bpm- No acute changes  Echocardiogram 04/22/2019 IMPRESSIONS    1. The left ventricle has normal systolic function with an ejection  fraction of 60-65%. The cavity size was normal. There is mildly increased  left ventricular wall thickness. Left ventricular diastolic Doppler  parameters are consistent with impaired  relaxation.  2. The right ventricle has normal systolic function. The cavity was  normal.  3. There is mild dilatation of the ascending aorta measuring 38 mm.  4. The aortic valve is tricuspid.  5. Normal LV systolic function; mild diastolic dysfunction; mild LVH.  6. The mitral valve is grossly normal.   Assessment & Plan   1.  Acute on chronic diastolic CHF-euvolemic today.    Weight today 326.8.  Recently contacted nurse triage line was instructed to double his Lasix.  Echocardiogram 04/22/2019 showed normal LVEF and impaired diastolic parameters Continue furosemide, carvedilol Heart healthy low-sodium diet-salty 6 given-1800 mg sodium restriction Increase physical activity as tolerated Daily weights Contact office with weight increase of 3 pounds overnight or 5 pounds in 1 week Lower extremity support stockings-[place] support stockings sheet given Elevate lower extremities when not active Fluid restriction 64  ounces-reviewed   Essential hypertension-BP today 134/68.  Well-controlled at home 120s-130s over 70s Continue lisinopril, Imdur, carvedilol Heart healthy low-sodium diet-salty 6 given Increase physical activity as tolerated  Coronary artery disease-denies chest pain.  Continues with chronic occasional intermittent chest discomfort and shortness of breath with increased physical activity.  Lipid management with Dr. Debara Pickett, recently seen and evaluated 12/18/2020.  He was not able to tolerate Vascepa.  He was referred to be evaluated for cord trial for evaluation of antisense oligonucleotide to ApoCIII. Continue aspirin, atorvastatin, Imdur, fenofibrate, carvedilol Heart healthy low-sodium diet-salty 6 given Increase physical activity as tolerated  Morbid obesity-weight today 326.8 pounds. Continue weight loss Heart healthy low-sodium diet Increase physical activity as tolerated  Disposition: Follow-up with Dr. Stanford Breed or me in 6 months.   Jossie Ng. Gee Habig NP-C    01/26/2021, 1:58 PM [facility] [place] [address] Office [phone] Fax [phone]  Notice: This dictation was prepared with Dragon dictation along with smaller phrase technology. Any transcriptional errors that result from this process are unintentional and may not be corrected upon review.  I spent 12 minutes examining this patient, reviewing medications, and using patient centered shared decision making involving her cardiac care.  Prior to her visit I spent greater than 20 minutes reviewing her past medical history,  medications, and prior cardiac tests.

## 2021-01-26 ENCOUNTER — Ambulatory Visit: Payer: Medicaid Other | Admitting: General Practice

## 2021-01-26 ENCOUNTER — Other Ambulatory Visit: Payer: Self-pay

## 2021-01-26 ENCOUNTER — Encounter: Payer: Self-pay | Admitting: General Practice

## 2021-01-26 VITALS — BP 134/68 | HR 79 | Ht 70.0 in | Wt 326.8 lb

## 2021-01-26 DIAGNOSIS — I10 Essential (primary) hypertension: Secondary | ICD-10-CM

## 2021-01-26 DIAGNOSIS — I5033 Acute on chronic diastolic (congestive) heart failure: Secondary | ICD-10-CM | POA: Diagnosis not present

## 2021-01-26 DIAGNOSIS — I2510 Atherosclerotic heart disease of native coronary artery without angina pectoris: Secondary | ICD-10-CM | POA: Diagnosis not present

## 2021-01-26 NOTE — Patient Instructions (Signed)
Medication Instructions:  The current medical regimen is effective;  continue present plan and medications as directed. Please refer to the Current Medication list given to you today.  *If you need a refill on your cardiac medications before your next appointment, please call your pharmacy*  Lab Work:   Testing/Procedures:  NONE    NONE  Special Instructions PLEASE READ AND FOLLOW SALTY 6-ATTACHED-1,800mg  daily  PLEASE INCREASE PHYSICAL ACTIVITY AS TOLERATED  Follow-Up: Your next appointment:  6 month(s) In Person with K. Mali Hilty, MD OR IF UNAVAILABLE JESSE CLEAVER, FNP-C   Please call our office 2 months in advance to schedule this appointment   At [facility], you and your health needs are our priority.  As part of our continuing mission to provide you with exceptional heart care, we have created designated Provider Care Teams.  These Care Teams include your primary Cardiologist (physician) and Advanced Practice Providers (APPs -  Physician Assistants and Nurse Practitioners) who all work together to provide you with the care you need, when you need it.            6 SALTY THINGS TO AVOID     1,800MG  DAILY

## 2021-02-01 ENCOUNTER — Other Ambulatory Visit: Payer: Self-pay

## 2021-02-01 ENCOUNTER — Ambulatory Visit (HOSPITAL_COMMUNITY)
Admission: EM | Admit: 2021-02-01 | Discharge: 2021-02-01 | Disposition: A | Discharge status: Home / Self Care | Payer: Medicaid Other

## 2021-02-01 NOTE — ED Notes (Signed)
Patient has a provider in [place] that usually applies dexcom 6 equipment and places codes in system  Patient situation discussed with [place], [place].  Unable to provide services patient requesting.  Notified patient .  Patient plans to go tomorrow morning.

## 2021-02-15 ENCOUNTER — Encounter: Payer: Medicaid Other | Admitting: *Deleted

## 2021-02-15 VITALS — BP 157/58 | HR 67 | Temp 98.6°F | Resp 20 | Ht 70.0 in | Wt 327.0 lb

## 2021-02-15 DIAGNOSIS — Z006 Encounter for examination for normal comparison and control in clinical research program: Secondary | ICD-10-CM

## 2021-02-16 ENCOUNTER — Other Ambulatory Visit: Payer: Self-pay

## 2021-02-16 NOTE — Research (Signed)
Subject Name: [name]  Subject met inclusion and exclusion criteria.  The informed consent form, study requirements and expectations were reviewed with the subject and questions and concerns were addressed prior to the signing of the consent form.  The subject verbalized understanding of the trial requirements.  The subject agreed to participate in the CORE trial and signed the informed consent at 0903 on 02/15/2021  The informed consent was obtained prior to performance of any protocol-specific procedures for the subject.  A copy of the signed informed consent was given to the subject and a copy was placed in the subject's medical record.   [name] C         Screening Run-In Clinic Visit    Date of Visit: 02/15/2021   Subject #: [id number]      During this visit the following activities were completed:  '[x]' Reading, Signing and Understanding the informed Consent   '[x]' Review Inclusion/Exclusion Criteria  '[x]' Vital Signs, Height, & Weight:  . - Blood pressure: (Subject sat supine for at least 5 minutes before blood pressure was performed) 157/58 mmHg . - Heart rate: 67 bpm  . - Temperature: 98.6  . - Respiratory Rate: 20  . - Oxygen Saturation: 98% . - Weight: 327 lb . - Height: 5'10"  '[x]' Physical Exam done by PI or Sub-I  '[x]' Review Subjects Medical History & Concomitant Medications  '[x]' Review Any Adverse Events/ Serious Adverse Events  '[x]' Review of any ER Visits, Hospitalizations and Inpatient Days  '[x]' 12-Lead ECG (Subject sat supine for at least 5 minutes before this was performed)  . *All ECG's completed will be available in subjects binder   '[x]'  Subject fasting   '[x]'  Blood and Urine specimens collected per protocol   '[]' Genetic Testing Completed (Only for patients with suspected FCS) - N/A not suspected for FCS  '[x]' Extended Urinalysis/Pregnancy Test (if woman of childbearing age)  '[x]' Diet/Lifestyle/Alcohol Counseling with Subject  '[x]' FCS Symptoms 2  Week Recall  '[x]' Education/teaching subject on importance of completing the daily diary   '[x]' Education on the importance of complying with contraception precautions during study with subject agreement

## 2021-02-22 ENCOUNTER — Encounter: Payer: Self-pay | Admitting: *Deleted

## 2021-02-22 NOTE — Progress Notes (Addendum)
                Abnormal Labs: Screening Run-In Visit for CORE on Monday, April 25th, 2022  Chemistry  Glucose 199mg /dL                              Clinically Significant? [] Yes [x] No AST/SGOT 13 U/L                   Clinically Significant? [] Yes [x] No  Gamma Glutamyl Transferase (GGT) 163 U/L     Clinically Significant? [] Yes [x] No Hs-C-Reactive Protein 7.8 mg/L      Clinically Significant? [] Yes [x] No Insulin 189.0            Clinically Significant? [] Yes [x] No Hemoglobin A1c 9.1%                   Clinically Significant? [x] Yes [] No  Lipids: Triglyceride 233 mg/dL       Clinically Significant? [] Yes [x] No  Hematology: Red Blood Cells 4.12                    Clinically Significant? [] Yes [x] No Hemoglobin 12.8 g/dL                   Clinically Significant? [] Yes [x] No Hematocrit 38%                   Clinically Significant? [] Yes [x] No  Urinalysis: Glucose 1000 mg/dL                   Clinically Significant? [x] Yes [] No  Urine Chemistry: Urine Albumin 4.07 mg/dL                  Clinically Significant? [] Yes [x] No Protein Creatinine Ratio 257 mg/g                 Clinically Significant? [] Yes [x] No Albumin Creatinine Ratio 50 mg/g                 Clinically Significant? [] Yes [x] No  Coagulation: Activated Partial  Thromboplastin Time (APTT) 23.3 sec                 Clinically Significant? [] Yes [x] No   Any further action needed based on if PI believes any values to be clinically significant? _____Yes, please notify PCP that A1c is not well controlled and there is significant glucose in the urine__________  Pixie Casino, MD, [facility], [place] Director of the Advanced Lipid Disorders &  Cardiovascular Risk Reduction Clinic Diplomate of the American Board of Clinical Lipidology Attending Cardiologist  Direct Dial: [phone]  Fax: [phone]  Website:  [email or web address]

## 2021-03-01 ENCOUNTER — Other Ambulatory Visit: Payer: Self-pay

## 2021-03-01 DIAGNOSIS — Z006 Encounter for examination for normal comparison and control in clinical research program: Secondary | ICD-10-CM

## 2021-03-01 NOTE — Research (Signed)
Subject came in for lab-redraw for CORE trial. Concomitant medications reviewed. No AE/SAE to report.

## 2021-03-04 ENCOUNTER — Encounter: Payer: Self-pay | Admitting: *Deleted

## 2021-03-04 NOTE — Progress Notes (Addendum)
       Screening Run-In Retest Abnormal Lab Values - Subject S501 - May 9th, 2022: Chemistry: Glucose 245 mg/dL               [] Clinically Significant [x] Not Clinically Significant Gamma Glutamyl Transferase (GGT) 175 U/L   [] Clinically Significant [x] Not Clinically Significant Hs-C-Reactive Protein 10.6mg /L        [] Clinically Significant [x] Not Clinically Significant Insulin 112.0           [] Clinically Significant [x] Not Clinically Significant   Lipids: Triglyceride 995 mg/dL         [x] Clinically Significant [] Not Clinically Significant HDL-Cholesterol (ppt) 24 mg/dL        [] Clinically Significant [x] Not Clinically Significant Apolipoprotein CIII 33.18 mg/dL         [x] Clinically Significant [] Not Clinically Significant  Pixie Casino, MD, [facility], [place] Director of the Advanced Lipid Disorders &  Cardiovascular Risk Reduction Clinic Diplomate of the American Board of Clinical Lipidology Attending Cardiologist  Direct Dial: [phone]  Fax: [phone]  Website:  [email or web address]

## 2021-03-11 ENCOUNTER — Encounter: Payer: Medicaid Other | Admitting: *Deleted

## 2021-03-11 ENCOUNTER — Other Ambulatory Visit: Payer: Self-pay

## 2021-03-11 DIAGNOSIS — Z006 Encounter for examination for normal comparison and control in clinical research program: Secondary | ICD-10-CM

## 2021-03-11 NOTE — Research (Signed)
     Screening Qualification Visit   Subject Number: B096                         Date: May 19th, 2022      [x] Inclusion/Exclusion Criteria   [] Pregnancy Test (if applicable)  [x] Collection of Hematology and Lipid Panel  [x] FCS Symptoms 7 Day Recall  [x] Assessment of ER Visits, Hospitalization and Inpatient Days  [x] Adverse Events and Concomitant Medications       Subject came into research clinic today for the Screening Qualification Visit for the CORE research trial. All concomitant medications were reviewed and updated in [facility] if applicable. There are no new AE's or SAE's to report at this time. Subject's FCS Symptoms 7 Day Recall was completed and collection of hematology and lipid panel was complete and sent out today. Once I receive the lab results, if the subject is able to proceed, we will either schedule a qualification retest visit or day 1/randomization visit.

## 2021-03-15 ENCOUNTER — Encounter: Payer: Self-pay | Admitting: *Deleted

## 2021-03-15 NOTE — Progress Notes (Addendum)
            Screening Qualification Visit - Subject L373 - Thursday, May 19th, 2022   Abnormal Lab Results:  Lipids: Triglyceride 227 mg/dL   [] Clinically Significant [x] Not Clinically Significant  Hematology: Red Blood Cells 4.29    [] Clinically Significant [x] Not Clinically Significant Hemoglobin 12.8 g/dL    [] Clinically Significant [x] Not Clinically Significant    Will bring this patient back in for a screening qualification re-test since triglyceride level is <500.   Pixie Casino, MD, [facility], [place] Director of the Advanced Lipid Disorders &  Cardiovascular Risk Reduction Clinic Diplomate of the American Board of Clinical Lipidology Attending Cardiologist  Direct Dial: [phone]  Fax: [phone]  Website:  [email or web address]

## 2021-03-20 ENCOUNTER — Other Ambulatory Visit: Payer: Self-pay

## 2021-03-20 ENCOUNTER — Encounter (HOSPITAL_COMMUNITY): Payer: Self-pay | Admitting: Emergency Medicine

## 2021-03-20 ENCOUNTER — Emergency Department (HOSPITAL_COMMUNITY)
Admission: EM | Admit: 2021-03-20 | Discharge: 2021-03-20 | Disposition: A | Discharge status: Home / Self Care | Payer: Medicaid Other | Attending: Emergency Medicine | Admitting: Emergency Medicine

## 2021-03-20 DIAGNOSIS — E1169 Type 2 diabetes mellitus with other specified complication: Secondary | ICD-10-CM | POA: Insufficient documentation

## 2021-03-20 DIAGNOSIS — Z7984 Long term (current) use of oral hypoglycemic drugs: Secondary | ICD-10-CM | POA: Insufficient documentation

## 2021-03-20 DIAGNOSIS — Z79899 Other long term (current) drug therapy: Secondary | ICD-10-CM | POA: Insufficient documentation

## 2021-03-20 DIAGNOSIS — E782 Mixed hyperlipidemia: Secondary | ICD-10-CM | POA: Insufficient documentation

## 2021-03-20 DIAGNOSIS — E1110 Type 2 diabetes mellitus with ketoacidosis without coma: Secondary | ICD-10-CM | POA: Diagnosis not present

## 2021-03-20 DIAGNOSIS — E1142 Type 2 diabetes mellitus with diabetic polyneuropathy: Secondary | ICD-10-CM | POA: Diagnosis not present

## 2021-03-20 DIAGNOSIS — I5032 Chronic diastolic (congestive) heart failure: Secondary | ICD-10-CM | POA: Diagnosis not present

## 2021-03-20 DIAGNOSIS — Z7982 Long term (current) use of aspirin: Secondary | ICD-10-CM | POA: Diagnosis not present

## 2021-03-20 DIAGNOSIS — Z794 Long term (current) use of insulin: Secondary | ICD-10-CM | POA: Insufficient documentation

## 2021-03-20 DIAGNOSIS — I110 Hypertensive heart disease with heart failure: Secondary | ICD-10-CM | POA: Diagnosis not present

## 2021-03-20 DIAGNOSIS — I2510 Atherosclerotic heart disease of native coronary artery without angina pectoris: Secondary | ICD-10-CM | POA: Insufficient documentation

## 2021-03-20 DIAGNOSIS — I872 Venous insufficiency (chronic) (peripheral): Secondary | ICD-10-CM | POA: Diagnosis not present

## 2021-03-20 DIAGNOSIS — R600 Localized edema: Secondary | ICD-10-CM | POA: Diagnosis present

## 2021-03-20 NOTE — ED Provider Notes (Signed)
[name] Provider Note   CSN: [id number] Arrival date & time: 03/20/21  1009     History No chief complaint on file.   [name] is a 60 y.o. male.  The history is provided by the patient and medical records. No language interpreter was used.     60 year old male with hx of CHF, obesity, peripheral neuropathy, here with concerns of leg bleeding.  He report has has peripheral edema and 2 days ago he notice bleeding from his LLE.  Denies any signifificant pain, unsure if he may have hit it against anything but he had to apply a dressing to it.  He denies fever, worsening pain.  Does take baby asa, not on other blood thinner med.  Denies sob.  Does reached out to PCP and was prescribed abx.  Does have an appointment with wound care center next month.  He's here requesting for wound eval and dressing change.   Past Medical History:  Diagnosis Date  . Anemia   . Arthritis   . Back pain   . CAD (coronary artery disease)    a. s/p DES to LAD in 05/2016  . Cervical radiculopathy   . Chronic diastolic CHF (congestive heart failure) ([place])   . Chronic pain   . Depression   . DVT (deep venous thrombosis) ([place])   . Hematemesis   . Hepatic steatosis   . Hyperlipidemia   . Hypertension   . IBS (irritable bowel syndrome)   . Morbid obesity ([place])   . OSA (obstructive sleep apnea)   . Pancreatitis   . PE (pulmonary thromboembolism) ([place])   . Peripheral neuropathy   . PUD (peptic ulcer disease)   . Renal disorder   . Stroke [facility])    a. ?details unclear - not seen on imaging when he was admitted in 05/2017 for TIA symptoms which were felt due to cervical radiculopathy.  . Thoracic aortic ectasia (HCC)    a. 4.3cm ectatic ascending thoracic aorta by CT 06/2017.   . Type 2 diabetes mellitus [facility])     Patient Active Problem List   Diagnosis Date Noted  . Morbid obesity ([place]) 01/15/2021  . Personal history of colonic polyps 01/15/2021  .  Rectal bleeding 01/15/2021  . Pain due to onychomycosis of toenails of both feet 01/15/2021  . Epistaxis, recurrent 10/21/2020  . Laceration of nose 10/21/2020  . Syncope 10/15/2020  . OSA (obstructive sleep apnea)   . Bilateral carotid artery stenosis 07/28/2020  . Bilateral lower extremity edema 07/28/2020  . CHF (congestive heart failure) ([place]) 07/28/2020  . DDD (degenerative disc disease), lumbar 07/28/2020  . Diabetic peripheral neuropathy ([place]) 07/28/2020  . Fatty liver 07/28/2020  . Irritable bowel syndrome with diarrhea 07/28/2020  . Osteoarthritis of right hip 07/28/2020  . Upper GI bleed 04/28/2020  . Hypertensive urgency 04/27/2020  . Acute GI bleeding 04/27/2020  . Mixed diabetic hyperlipidemia associated with type 2 diabetes mellitus ([place]) 04/27/2020  . Rotator cuff arthropathy 10/30/2019  . Cervical radiculopathy 09/24/2019  . Trochanteric bursitis of right hip 09/24/2019  . Chronic diastolic CHF (congestive heart failure) ([place]) 08/23/2019  . ARF (acute renal failure) ([place]) 08/01/2019  . Hyperkalemia 08/01/2019  . Uncontrolled type 2 diabetes mellitus with hyperglycemia ([place]) 08/01/2019  . AKI (acute kidney injury) ([place])   . Hypotension 07/31/2019  . DKA (diabetic ketoacidosis) ([place]) 05/05/2019  . Blurred vision 05/05/2019  . Trochanteric bursitis 02/11/2019  . Long-term insulin use ([place]) 01/25/2019  .  Alteration consciousness 11/19/2018  . Neck pain 10/09/2018  . Left arm weakness 10/09/2018  . B12 deficiency 08/10/2017  . Persistent headaches 08/09/2017  . GERD (gastroesophageal reflux disease) 06/29/2017  . Left arm numbness   . Cerebral embolism with cerebral infarction 06/17/2017  . TIA (transient ischemic attack) 06/17/2017  . Chronic back pain 12/29/2016  . Depression 12/15/2016  . Vitamin D deficiency 12/09/2016  . Right hip pain 12/07/2016  . Hypokalemia 12/07/2016  . Type 2 diabetes mellitus with vascular disease ([place]) 05/31/2016  . Normocytic  normochromic anemia 05/31/2016  . Chest pain 05/31/2016  . Coronary artery disease involving native coronary artery of native heart without angina pectoris 01/06/2016  . DVT (deep venous thrombosis) ([place]) 01/06/2016  . Lactic acidosis 05/28/2014  . Nonspecific chest pain 01/29/2014  . Uncontrolled secondary diabetes with peripheral neuropathy ([place]) 01/29/2014  . Obesity, Class III, BMI 40-49.9 (morbid obesity) ([place]) 01/29/2014  . Snoring 01/29/2014  . Dyslipidemia 01/29/2014  . HTN (hypertension) 01/29/2014  . Abnormal nuclear stress test 01/29/2014    Past Surgical History:  Procedure Laterality Date  . CARDIAC CATHETERIZATION N/A 05/31/2016   Procedure: Left Heart Cath and Coronary Angiography;  Surgeon: Peter M Martinique, MD;  Location: [place] CV [facility];  Service: Cardiovascular;  Laterality: N/A;  . CARDIAC CATHETERIZATION N/A 05/31/2016   Procedure: Intravascular Pressure Wire/FFR Study;  Surgeon: Peter M Martinique, MD;  Location: [place] [facility];  Service: Cardiovascular;  Laterality: N/A;  . CARDIAC CATHETERIZATION N/A 05/31/2016   Procedure: Coronary Stent Intervention;  Surgeon: Peter M Martinique, MD;  Location: [place] [facility];  Service: Cardiovascular;  Laterality: N/A;  . COLONOSCOPY WITH PROPOFOL N/A 04/29/2020   Procedure: COLONOSCOPY WITH PROPOFOL;  Surgeon: Wilford Corner, MD;  Location: [place];  Service: Endoscopy;  Laterality: N/A;  . ESOPHAGOGASTRODUODENOSCOPY N/A 04/29/2020   Procedure: ESOPHAGOGASTRODUODENOSCOPY (EGD);  Surgeon: Wilford Corner, MD;  Location: [place];  Service: Endoscopy;  Laterality: N/A;  . LEFT HEART CATH AND CORONARY ANGIOGRAPHY N/A 12/08/2017   Procedure: LEFT HEART CATH AND CORONARY ANGIOGRAPHY;  Surgeon: Leonie Man, MD;  Location: [place] CV [facility];  Service: Cardiovascular;  Laterality: N/A;  . LEFT HEART CATHETERIZATION WITH CORONARY ANGIOGRAM N/A 02/03/2014   Procedure: LEFT HEART CATHETERIZATION WITH CORONARY ANGIOGRAM;   Surgeon: Pixie Casino, MD;  [facility] CATH LAB;  Service: Cardiovascular;  Laterality: N/A;  . left leg stent     . POLYPECTOMY  04/29/2020   Procedure: POLYPECTOMY;  Surgeon: Wilford Corner, MD;  Location: [facility] ENDOSCOPY;  Service: Endoscopy;;       Family History  Problem Relation Age of Onset  . Cancer Father   . Hypertension Mother   . Diabetes Mother   . Breast cancer Mother   . Hypertension Brother   . Diabetes Brother   . Hypertension Sister   . Diabetes Sister     Social History   Tobacco Use  . Smoking status: Never Smoker  . Smokeless tobacco: Never Used  Vaping Use  . Vaping Use: Never used  Substance Use Topics  . Alcohol use: No  . Drug use: No    Home Medications Prior to Admission medications   Medication Sig Start Date End Date Taking? Authorizing Provider  Accu-Chek FastClix Lancets MISC 1 Device by Other route 2 (two) times daily.  03/20/19   [provider]  ACCU-CHEK GUIDE test strip  01/08/21   [provider]  albuterol (VENTOLIN HFA) 108 (90 Base) MCG/ACT inhaler Inhale 2  puffs into the lungs every 6 (six) hours as needed for wheezing or shortness of breath. As Directed    [provider]  allopurinol (ZYLOPRIM) 100 MG tablet Take 100 mg by mouth daily. 03/31/20   [provider]  aspirin 81 MG chewable tablet Chew 1 tablet (81 mg total) by mouth daily. 06/01/16   Elwin Mocha, MD  atorvastatin (LIPITOR) 40 MG tablet Take 40 mg by mouth daily. 1 tablet at bedtime    [provider]  carvedilol (COREG) 12.5 MG tablet Take 12.5 mg by mouth 2 (two) times daily. 10/22/20   [provider]  diclofenac (FLECTOR) 1.3 % PTCH Place 1 patch onto the skin daily as needed for pain. 03/02/20   [provider]  famotidine (PEPCID) 20 MG tablet Take 20 mg by mouth daily. 03/05/20   [provider]  fenofibrate (TRICOR) 145 MG tablet Take 1 tablet (145 mg total) by mouth daily. 06/18/20    Hilty, Nadean Corwin, MD  furosemide (LASIX) 40 MG tablet Take 1 tablet (40 mg total) by mouth daily. PLEASE RESUME ONLY FROM 10/21/2020. DOSE HAS BEEN CHANGED. 10/21/20 10/21/21  Bonnielee Haff, MD  gabapentin (NEURONTIN) 400 MG capsule TAKE 1 (ONE) CAPSULE THREE TIMES DAILY FOR NEUROPATHY 11/24/20   [provider]  glipiZIDE (GLUCOTROL) 10 MG tablet Take 10 mg by mouth 2 (two) times daily. 05/30/17   [provider]  HUMULIN R U-500 KWIKPEN 500 UNIT/ML kwikpen Inject 0-75 Units into the skin See admin instructions. Inject under the skin three times daily as directed or sliding scale: 65 units in the morning, 70 units at lunch, 75 units at bedtime 06/04/20   [provider]  Insulin Pen Needle 31G X 5 MM MISC Use 1 needle daily to inject insulin as prescribed Patient taking differently: 1 each. Use 1 needle daily to inject insulin as prescribed 06/18/17   Barton Dubois, MD  isosorbide mononitrate (IMDUR) 30 MG 24 hr tablet TAKE 1 TABLET BY MOUTH DAILY 12/24/20   Hilty, Nadean Corwin, MD  lisinopril (ZESTRIL) 5 MG tablet Take 5 mg by mouth daily. 12/04/20   [provider]  nitroGLYCERIN (NITROSTAT) 0.4 MG SL tablet Place 1 tablet (0.4 mg total) under the tongue every 5 (five) minutes as needed for chest pain. 02/06/19   Skeet Latch, MD  ondansetron (ZOFRAN) 4 MG tablet Take 4 mg by mouth 2 (two) times daily as needed for nausea/vomiting. 04/21/20   [provider]  oxyCODONE (ROXICODONE) 15 MG immediate release tablet Take 15 mg 3 (three) times daily as needed by mouth for pain.  08/28/17   [provider]  polyethylene glycol-electrolytes (NULYTELY) 420 g solution DISPENSE ONE FOUR LITER CONTAINER. USE AS DIRECTED 12/22/20   [provider]  potassium chloride (MICRO-K) 10 MEQ CR capsule Take 10 mEq by mouth in the morning and at bedtime. 04/03/20   [provider]  sertraline (ZOLOFT) 50 MG tablet Take 50 mg by mouth daily. 12/17/20    [provider]  tiZANidine (ZANAFLEX) 4 MG tablet Take 4 mg by mouth at bedtime as needed for muscle spasms. 03/31/20   [provider]  traZODone (DESYREL) 50 MG tablet Take 1 tablet (50 mg total) by mouth at bedtime. 12/15/16   Florinda Marker, MD  Vitamin D, Ergocalciferol, (DRISDOL) 1.25 MG (50000 UT) CAPS capsule Take 50,000 Units by mouth every Monday. 08/15/19   [provider]    Allergies    Coconut flavor [flavoring agent],  Coconut oil, Ibuprofen, Aleve [naproxen], Nsaids, and Vascepa [icosapent ethyl]  Review of Systems   Review of Systems  Constitutional: Negative for fever.  Skin: Positive for wound.    Physical Exam Updated Vital Signs BP 139/70   Pulse 83   Temp 99.3 F (37.4 C)   Resp 18   SpO2 93%   Physical Exam Vitals and nursing note reviewed.  Constitutional:      General: He is not in acute distress.    Appearance: He is well-developed.  HENT:     Head: Atraumatic.  Eyes:     Conjunctiva/sclera: Conjunctivae normal.  Musculoskeletal:     Cervical back: Neck supple.  Skin:    Findings: No rash.     Comments: Chronic venous stasis skin changes to BLE with small skin tear noted to LLE at the anterior tib/fib region, not actively bleeding.  No warmth or worsening erythema.  No significant tenderness.   Neurological:     Mental Status: He is alert.     ED Results / Procedures / Treatments   Labs (all labs ordered are listed, but only abnormal results are displayed) Labs Reviewed - No data to display  EKG None  Radiology No results found.  Procedures Procedures   Medications Ordered in ED Medications - No data to display  ED Course  I have reviewed the triage vital signs and the nursing notes.  Pertinent labs & imaging results that were available during my care of the patient were reviewed by me and considered in my medical decision making (see chart for details).    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  No evidence of infeection, no obvious concerning injury.  Dressing changes applied.  Pt does have abx prescribed by PCP and does have outpt f/u with wound care center soon.  Return precaution given.    Domenic Moras, PA-C 03/20/21 [place], Alvin Critchley, DO 03/20/21 1526

## 2021-03-20 NOTE — ED Triage Notes (Signed)
C/o swelling and bleeding to L leg x 2 days.  States he has been seen for same and has been to wound center for same before.  Denies injury.

## 2021-03-20 NOTE — ED Notes (Signed)
Pt seen by PA and discharged from triage.

## 2021-03-20 NOTE — Discharge Instructions (Signed)
Please follow up with your wound care center for further management of your leg.

## 2021-03-30 DIAGNOSIS — M5416 Radiculopathy, lumbar region: Secondary | ICD-10-CM | POA: Insufficient documentation

## 2021-04-12 ENCOUNTER — Other Ambulatory Visit: Payer: Self-pay

## 2021-04-12 ENCOUNTER — Encounter (HOSPITAL_BASED_OUTPATIENT_CLINIC_OR_DEPARTMENT_OTHER): Payer: Medicaid Other | Attending: Internal Medicine | Admitting: Internal Medicine

## 2021-04-12 DIAGNOSIS — I890 Lymphedema, not elsewhere classified: Secondary | ICD-10-CM

## 2021-04-12 DIAGNOSIS — L97829 Non-pressure chronic ulcer of other part of left lower leg with unspecified severity: Secondary | ICD-10-CM

## 2021-04-12 DIAGNOSIS — Z886 Allergy status to analgesic agent status: Secondary | ICD-10-CM | POA: Insufficient documentation

## 2021-04-12 DIAGNOSIS — Z833 Family history of diabetes mellitus: Secondary | ICD-10-CM | POA: Diagnosis not present

## 2021-04-12 DIAGNOSIS — G9009 Other idiopathic peripheral autonomic neuropathy: Secondary | ICD-10-CM

## 2021-04-12 DIAGNOSIS — I110 Hypertensive heart disease with heart failure: Secondary | ICD-10-CM | POA: Insufficient documentation

## 2021-04-12 DIAGNOSIS — I872 Venous insufficiency (chronic) (peripheral): Secondary | ICD-10-CM | POA: Diagnosis not present

## 2021-04-12 DIAGNOSIS — I509 Heart failure, unspecified: Secondary | ICD-10-CM | POA: Insufficient documentation

## 2021-04-12 DIAGNOSIS — Z86718 Personal history of other venous thrombosis and embolism: Secondary | ICD-10-CM | POA: Insufficient documentation

## 2021-04-12 DIAGNOSIS — E1151 Type 2 diabetes mellitus with diabetic peripheral angiopathy without gangrene: Secondary | ICD-10-CM | POA: Insufficient documentation

## 2021-04-12 DIAGNOSIS — Z794 Long term (current) use of insulin: Secondary | ICD-10-CM | POA: Diagnosis not present

## 2021-04-12 DIAGNOSIS — Z8249 Family history of ischemic heart disease and other diseases of the circulatory system: Secondary | ICD-10-CM | POA: Diagnosis not present

## 2021-04-12 NOTE — Progress Notes (Signed)
TRACER, GUTRIDGE ([id number]) Visit Report for 04/12/2021 Allergy List Details Patient Name: Date of Service: [name], GERADS 04/12/2021 9:00 [place] Record Number: [id number] Patient Account Number: [id number] Date of Birth/Sex: Treating RN: 1961-06-22 (60 y.o. [name] Primary Care Dimas Scheck: Sung Amabile, RO MA NA Other Clinician: Referring Tevion Laforge: Treating Yunuen Mordan/Extender: Lunette Stands, RO MA NA Weeks in Treatment: 0 Allergies Active Allergies ibuprofen Aleve NSAIDS (Non-Steroidal Anti-Inflammatory Drug) Vascepa coconut flavor Allergy Notes Electronic Signature(s) Signed: 04/12/2021 6:31:05 PM By: Lorrin Jackson Entered By: Lorrin Jackson on 04/12/2021 09:32:45 -------------------------------------------------------------------------------- Arrival Information Details Patient Name: Date of Service: [name], [name] 04/12/2021 9:00 A M Medical Record Number: [id number] Patient Account Number: [id number] Date of Birth/Sex: Treating RN: 1961/06/26 (60 y.o. [name] Primary Care Charitie Hinote: HA YES, RO MA NA Other Clinician: Referring Gracelyn Coventry: Treating Yaslene Lindamood/Extender: Lunette Stands, RO MA NA Weeks in Treatment: 0 Visit Information Patient Arrived: Ambulatory Arrival Time: 09:21 Transfer Assistance: None Patient Identification Verified: Yes Secondary Verification Process Completed: Yes Patient Requires Transmission-Based Precautions: No Patient Has Alerts: Yes Patient Alerts: Patient on Blood Thinner L ABI=[place] History Since Last Visit Added or deleted any medications: No Any new allergies or adverse reactions: No Had a fall or experienced change in activities of daily living that may affect risk of falls: No Signs or symptoms of abuse/neglect since last visito No Hospitalized since last visit: No Implantable device outside of the clinic excluding cellular tissue based products placed in the center since last visit: No Has  Dressing in Place as Prescribed: Yes Electronic Signature(s) Signed: 04/12/2021 6:31:05 PM By: Lorrin Jackson Entered By: Lorrin Jackson on 04/12/2021 09:47:04 -------------------------------------------------------------------------------- Clinic Level of Care Assessment Details Patient Name: Date of Service: Stein, Juan 04/12/2021 9:00 [place] Record Number: [id number] Patient Account Number: [id number] Date of Birth/Sex: Treating RN: [birth year] (60 y.o. [name] Primary Care Mckenna Gamm: HA YES, RO MA NA Other Clinician: Referring Farrah Skoda: Treating Margreat Widener/Extender: [name] HA YES, RO MA NA Weeks in Treatment: 0 Clinic Level of Care Assessment Items TOOL 2 Quantity Score X- 1 0 Use when only an EandM is performed on the INITIAL visit ASSESSMENTS - Nursing Assessment / Reassessment X- 1 20 General Physical Exam (combine w/ comprehensive assessment (listed just below) when performed on new pt. evals) X- 1 25 Comprehensive Assessment (HX, ROS, Risk Assessments, Wounds Hx, etc.) ASSESSMENTS - Wound and Skin A ssessment / Reassessment X - Simple Wound Assessment / Reassessment - one wound 1 5 []  - 0 Complex Wound Assessment / Reassessment - multiple wounds []  - 0 Dermatologic / Skin Assessment (not related to wound area) ASSESSMENTS - Ostomy and/or Continence Assessment and Care []  - 0 Incontinence Assessment and Management []  - 0 Ostomy Care Assessment and Management (repouching, etc.) PROCESS - Coordination of Care []  - 0 Simple Patient / Family Education for ongoing care X- 1 20 Complex (extensive) Patient / Family Education for ongoing care []  - 0 Staff obtains Programmer, systems, Records, T Results / Process Orders est []  - 0 Staff telephones HHA, Nursing Homes / Clarify orders / etc []  - 0 Routine Transfer to another Facility (non-emergent condition) []  - 0 Routine Hospital Admission (non-emergent condition) []  - 0 New Admissions / Medical laboratory scientific officer / Ordering NPWT Apligraf, etc. , []  - 0 Emergency Hospital Admission (emergent condition) []  - 0 Simple Discharge Coordination []  - 0 Complex (extensive) Discharge Coordination PROCESS - Special Needs []  - 0 Pediatric / Minor  Patient Management []  - 0 Isolation Patient Management []  - 0 Hearing / Language / Visual special needs []  - 0 Assessment of Community assistance (transportation, D/C planning, etc.) []  - 0 Additional assistance / Altered mentation []  - 0 Support Surface(s) Assessment (bed, cushion, seat, etc.) INTERVENTIONS - Wound Cleansing / Measurement []  - 0 Wound Imaging (photographs - any number of wounds) []  - 0 Wound Tracing (instead of photographs) []  - 0 Simple Wound Measurement - one wound []  - 0 Complex Wound Measurement - multiple wounds []  - 0 Simple Wound Cleansing - one wound []  - 0 Complex Wound Cleansing - multiple wounds INTERVENTIONS - Wound Dressings []  - 0 Small Wound Dressing one or multiple wounds []  - 0 Medium Wound Dressing one or multiple wounds []  - 0 Large Wound Dressing one or multiple wounds []  - 0 Application of Medications - injection INTERVENTIONS - Miscellaneous []  - 0 External ear exam []  - 0 Specimen Collection (cultures, biopsies, blood, body fluids, etc.) []  - 0 Specimen(s) / Culture(s) sent or taken to Lab for analysis []  - 0 Patient Transfer (multiple staff / Civil Service fast streamer / Similar devices) []  - 0 Simple Staple / Suture removal (25 or less) []  - 0 Complex Staple / Suture removal (26 or more) []  - 0 Hypo / Hyperglycemic Management (close monitor of Blood Glucose) X- 1 15 Ankle / Brachial Index (ABI) - do not check if billed separately Has the patient been seen at the hospital within the last three years: Yes Total Score: 85 Level Of Care: New/Established - Level 3 Electronic Signature(s) Signed: 04/12/2021 6:31:05 PM By: Lorrin Jackson Entered By: Lorrin Jackson on 04/12/2021  09:54:07 -------------------------------------------------------------------------------- Encounter Discharge Information Details Patient Name: Date of Service: [name], [name] 04/12/2021 9:00 [place] Record Number: [id number] Patient Account Number: [id number] Date of Birth/Sex: Treating RN: 10/07/1961 (60 y.o. [name] Primary Care Shali Vesey: HA YES, RO MA NA Other Clinician: Referring Cayle Thunder: Treating Maggie Senseney/Extender: Lunette Stands, RO MA NA Weeks in Treatment: 0 Encounter Discharge Information Items Discharge Condition: Stable Ambulatory Status: Ambulatory Discharge Destination: Home Transportation: Private Auto Schedule Follow-up Appointment: Yes Clinical Summary of Care: Provided on 04/12/2021 Form Type Recipient Paper Patient Patient Electronic Signature(s) Signed: 04/12/2021 12:20:43 PM By: Lorrin Jackson Entered By: Lorrin Jackson on 04/12/2021 12:20:43 -------------------------------------------------------------------------------- Lower Extremity Assessment Details Patient Name: Date of Service: [name], Randel L. 04/12/2021 9:00 [place] Record Number: [id number] Patient Account Number: [id number] Date of Birth/Sex: Treating RN: 05/28/1961 (60 y.o. [name] Primary Care Jyquan Kenley: HA YES, RO MA NA Other Clinician: Referring Lonnel Gjerde: Treating Etai Copado/Extender: Lunette Stands, RO MA NA Weeks in Treatment: 0 Edema Assessment Assessed: [Left: No] [Right: Yes] Edema: [Left: Ye] [Right: s] Calf Left: Right: Point of Measurement: 29 cm From Medial Instep 45 cm Ankle Left: Right: Point of Measurement: 10 cm From Medial Instep 26.4 cm Knee To Floor Left: Right: From Medial Instep 43 cm Vascular Assessment Pulses: Dorsalis Pedis Palpable: [Right:Yes] Notes L ABI=Non compressible Electronic Signature(s) Signed: 04/12/2021 6:31:05 PM By: Lorrin Jackson Entered By: Lorrin Jackson on 04/12/2021  09:40:07 -------------------------------------------------------------------------------- Multi Wound Chart Details Patient Name: Date of Service: [name], Adonte L. 04/12/2021 9:00 A M Medical Record Number: [id number] Patient Account Number: [id number] Date of Birth/Sex: Treating RN: [birth year] (60 y.o. Janyth Contes Primary Care Veneda Kirksey: HA YES, RO MA NA Other Clinician: Referring Cariah Salatino: Treating Audelia Knape/Extender: [name] HA YES, RO MA NA Weeks in Treatment: 0 Vital Signs Height(in):  Pulse(bpm): 67 Weight(lbs): Blood Pressure(mmHg): 134/74 Body Mass Index(BMI): Temperature(F): 98.5 Respiratory Rate(breaths/min): 18 Electronic Signature(s) Signed: 04/12/2021 11:45:31 AM By: [name] DO Signed: 04/12/2021 5:37:12 PM By: Levan Hurst RN, BSN -------------------------------------------------------------------------------- Pain Assessment Details Patient Name: Date of Service: [name], Yash L. 04/12/2021 9:00 A M Medical Record Number: [id number] Patient Account Number: [id number] Date of Birth/Sex: Treating RN: [birth year] (60 y.o. [name] Primary Care Jarrett Chicoine: HA YES, RO MA NA Other Clinician: Referring Nicolle Heward: Treating Aarilyn Dye/Extender: Lunette Stands, RO MA NA Weeks in Treatment: 0 Active Problems Location of Pain Severity and Description of Pain Patient Has Paino No Site Locations Pain Management and Medication Current Pain Management: Electronic Signature(s) Signed: 04/12/2021 6:31:05 PM By: Lorrin Jackson Entered By: Lorrin Jackson on 04/12/2021 09:40:17 -------------------------------------------------------------------------------- Patient/Caregiver Education Details Patient Name: Date of Service: [name], [name] 6/20/2022andnbsp9:00 [place] Record Number: [id number] Patient Account Number: [id number] Date of Birth/Gender: Treating RN: 04/07/1961 (60 y.o. [name] Primary Care  Physician: Sung Amabile, RO MA NA Other Clinician: Referring Physician: Treating Physician/Extender: Lunette Stands, RO MA NA Weeks in Treatment: 0 Education Assessment Education Provided To: Patient Education Topics Provided Venous: Methods: Explain/Verbal, Printed Responses: State content correctly Electronic Signature(s) Signed: 04/12/2021 6:31:05 PM By: Lorrin Jackson Entered By: Lorrin Jackson on 04/12/2021 09:53:00 -------------------------------------------------------------------------------- Vitals Details Patient Name: Date of Service: [name], Jairon L. 04/12/2021 9:00 A M Medical Record Number: [id number] Patient Account Number: [id number] Date of Birth/Sex: Treating RN: May 15, 1961 (60 y.o. [name] Primary Care Akesha Uresti: HA YES, RO MA NA Other Clinician: Referring Donnavan Covault: Treating Aissata Wilmore/Extender: Lunette Stands, RO MA NA Weeks in Treatment: 0 Vital Signs Time Taken: 09:31 Temperature (F): 98.5 Pulse (bpm): 67 Respiratory Rate (breaths/min): 18 Blood Pressure (mmHg): 134/74 Reference Range: 80 - 120 mg / dl Electronic Signature(s) Signed: 04/12/2021 6:31:05 PM By: Lorrin Jackson Entered By: Lorrin Jackson on 04/12/2021 09:32:23

## 2021-04-12 NOTE — Progress Notes (Signed)
Juan, Stein ([id number]) Visit Report for 04/12/2021 Abuse/Suicide Risk Screen Details Patient Name: Date of Service: [name], [name] 04/12/2021 9:00 A M Medical Record Number: [id number] Patient Account Number: [id number] Date of Birth/Sex: Treating RN: 1961-05-10 (60 y.o. [name] Primary Care Curran Lenderman: HA YES, RO MA NA Other Clinician: Referring Nonnie Pickney: Treating Dominque Marlin/Extender: Lunette Stands, RO MA NA Weeks in Treatment: 0 Abuse/Suicide Risk Screen Items Answer ABUSE RISK SCREEN: Has anyone close to you tried to hurt or harm you recentlyo No Do you feel uncomfortable with anyone in your familyo No Has anyone forced you do things that you didnt want to doo No Electronic Signature(s) Signed: 04/12/2021 6:31:05 PM By: Lorrin Jackson Entered By: Lorrin Jackson on 04/12/2021 09:33:35 -------------------------------------------------------------------------------- Activities of Daily Living Details Patient Name: Date of Service: Juan, Stein 04/12/2021 9:00 [place] Record Number: [id number] Patient Account Number: [id number] Date of Birth/Sex: Treating RN: 07/18/1961 (60 y.o. [name] Primary Care Kaisey Huseby: HA YES, RO MA NA Other Clinician: Referring Chinedu Agustin: Treating Wyley Hack/Extender: [name] HA YES, RO MA NA Weeks in Treatment: 0 Activities of Daily Living Items Answer Activities of Daily Living (Please select one for each item) Drive Automobile Completely Able T Medications ake Completely Able Use T elephone Completely Able Care for Appearance Completely Able Use T oilet Completely Able Bath / Shower Completely Able Dress Self Completely Able Feed Self Completely Able Walk Completely Able Get In / Out Bed Completely Able Housework Completely Able Prepare Meals Completely [place] for Self Completely Able Electronic Signature(s) Signed: 04/12/2021 6:31:05 PM By: Lorrin Jackson Entered By: Lorrin Jackson on 04/12/2021 09:33:57 -------------------------------------------------------------------------------- Education Screening Details Patient Name: Date of Service: Juan Lunch, [name] 04/12/2021 9:00 [place] Record Number: [id number] Patient Account Number: [id number] Date of Birth/Sex: Treating RN: 02-May-1961 (60 y.o. [name] Primary Care Dajaun Goldring: HA YES, RO MA NA Other Clinician: Referring Waneta Fitting: Treating Della [name]/Extender: Lunette Stands, RO MA NA Weeks in Treatment: 0 Primary Learner Assessed: Patient Learning Preferences/Education Level/Primary Language Learning Preference: Explanation, Demonstration, Printed Material Highest Education Level: High School Preferred Language: English Cognitive Barrier Language Barrier: No Translator Needed: No Memory Deficit: No Emotional Barrier: No Cultural/Religious Beliefs Affecting Medical Care: No Physical Barrier Impaired Vision: Yes Glasses Impaired Hearing: No Decreased Hand dexterity: No Knowledge/Comprehension Knowledge Level: High Comprehension Level: High Ability to understand written instructions: High Ability to understand verbal instructions: High Motivation Anxiety Level: Calm Cooperation: Cooperative Education Importance: Acknowledges Need Interest in Health Problems: Asks Questions Perception: Coherent Willingness to Engage in Self-Management High Activities: Readiness to Engage in Self-Management High Activities: Electronic Signature(s) Signed: 04/12/2021 6:31:05 PM By: Lorrin Jackson Entered By: Lorrin Jackson on 04/12/2021 09:34:36 -------------------------------------------------------------------------------- Fall Risk Assessment Details Patient Name: Date of Service: Juan Lunch, [name] 04/12/2021 9:00 A M Medical Record Number: [id number] Patient Account Number: [id number] Date of Birth/Sex: Treating RN: 1960-11-16 (60 y.o. [name] Primary Care Janae Bonser: HA YES, RO MA NA Other Clinician: Referring Rie Mcneil: Treating Severo Beber/Extender: Lunette Stands, RO MA NA Weeks in Treatment: 0 Fall Risk Assessment Items Have you had 2 or more falls in the last 12 monthso 0 No Have you had any fall that resulted in injury in the last 12 monthso 0 No FALLS RISK SCREEN History of falling - immediate or within 3 months 0 No Secondary diagnosis (Do you have 2 or more medical diagnoseso) 0 No Ambulatory aid None/bed rest/wheelchair/nurse 0 Yes  Crutches/cane/walker 0 No Furniture 0 No Intravenous therapy Access/Saline/Heparin Lock 0 No Gait/Transferring Normal/ bed rest/ wheelchair 0 Yes Weak (short steps with or without shuffle, stooped but able to lift head while walking, may seek 0 No support from furniture) Impaired (short steps with shuffle, may have difficulty arising from chair, head down, impaired 0 No balance) Mental Status Oriented to own ability 0 Yes Electronic Signature(s) Signed: 04/12/2021 6:31:05 PM By: Lorrin Jackson Entered By: Lorrin Jackson on 04/12/2021 09:34:47 -------------------------------------------------------------------------------- Foot Assessment Details Patient Name: Date of Service: Juan Lunch, [name] 04/12/2021 9:00 A M Medical Record Number: [id number] Patient Account Number: [id number] Date of Birth/Sex: Treating RN: [birth year] (60 y.o. [name] Primary Care Hanson Medeiros: HA YES, RO MA NA Other Clinician: Referring Briawna Carver: Treating Yanely Mast/Extender: Lunette Stands, RO MA NA Weeks in Treatment: 0 Foot Assessment Items Site Locations + = Sensation present, - = Sensation absent, C = Callus, U = Ulcer R = Redness, W = Warmth, M = Maceration, PU = Pre-ulcerative lesion F = Fissure, S = Swelling, D = Dryness Assessment Right: Left: Other Deformity: No No Prior Foot Ulcer: No No Prior Amputation: No No Charcot Joint: No No Ambulatory Status:  Ambulatory Without Help Gait: Steady Electronic Signature(s) Signed: 04/12/2021 6:31:05 PM By: Lorrin Jackson Entered By: Lorrin Jackson on 04/12/2021 09:35:38 -------------------------------------------------------------------------------- Nutrition Risk Screening Details Patient Name: Date of Service: LAKEITH, CAREAGA 04/12/2021 9:00 [place] Record Number: [id number] Patient Account Number: [id number] Date of Birth/Sex: Treating RN: 1961/06/06 (60 y.o. [name] Primary Care Rana Hochstein: HA YES, RO MA NA Other Clinician: Referring Itzabella Sorrels: Treating Evander Macaraeg/Extender: [name] HA YES, RO MA NA Weeks in Treatment: 0 Height (in): Weight (lbs): Body Mass Index (BMI): Nutrition Risk Screening Items Score Screening NUTRITION RISK SCREEN: I have an illness or condition that made me change the kind and/or amount of food I eat 0 No I eat fewer than two meals per day 0 No I eat few fruits and vegetables, or milk products 0 No I have three or more drinks of beer, liquor or wine almost every day 0 No I have tooth or mouth problems that make it hard for me to eat 0 No I don't always have enough money to buy the food I need 0 No I eat alone most of the time 0 No I take three or more different prescribed or over-the-counter drugs a day 1 Yes Without wanting to, I have lost or gained 10 pounds in the last six months 0 No I am not always physically able to shop, cook and/or feed myself 0 No Nutrition Protocols Good Risk Protocol 0 No interventions needed Moderate Risk Protocol High Risk Proctocol Risk Level: Good Risk Score: 1 Electronic Signature(s) Signed: 04/12/2021 6:31:05 PM By: Lorrin Jackson Entered By: Lorrin Jackson on 04/12/2021 09:35:03

## 2021-04-12 NOTE — Progress Notes (Signed)
CLINE, DRAHEIM ([id number]) Visit Report for 04/12/2021 Chief Complaint Document Details Patient Name: Date of Service: [name], [name] 04/12/2021 9:00 [place] Record Number: [id number] Patient Account Number: [id number] Date of Birth/Sex: Treating RN: 03-30-1961 (60 y.o. Janyth Contes Primary Care Provider: HA YES, RO MA NA Other Clinician: Referring Provider: Treating Provider/Extender: Lunette Stands, RO MA NA Weeks in Treatment: 0 Information Obtained from: Patient Chief Complaint Left lower extremity swelling with previous wounds that have healed Electronic Signature(s) Signed: 04/12/2021 11:45:31 AM By: [name] DO Entered By: [name] on 04/12/2021 11:37:11 -------------------------------------------------------------------------------- HPI Details Patient Name: Date of Service: [name], [name] 04/12/2021 9:00 [place] Record Number: [id number] Patient Account Number: [id number] Date of Birth/Sex: Treating RN: 04-09-1961 (60 y.o. Janyth Contes Primary Care Provider: HA YES, RO MA NA Other Clinician: Referring Provider: Treating Provider/Extender: Lunette Stands, RO MA NA Weeks in Treatment: 0 History of Present Illness HPI Description: Admission 6/20 [name] is a 60 year old male with a past medical history of CVA, hypertension, type 2 diabetes on insulin complicated by peripheral neuropathy, Chronic venous insufficiency/lymphedema, and left lower extremity DVT that presents to clinic today for healed wounds of his left lower extremity. He reports over the past month he had increased swelling to his legs due to a decrease in his diuretics. He developed scattered open wounds. He has compression therapy that he wears daily. He reports adjustment to his diuretic and this improved his swelling and thus his wounds healed. He was on a course of clindamycin prescribed by his primary care provider for this issue. He  has no complaints today. Electronic Signature(s) Signed: 04/12/2021 11:45:31 AM By: [name] DO Entered By: [name] on 04/12/2021 11:37:30 -------------------------------------------------------------------------------- Physical Exam Details Patient Name: Date of Service: [name], [name] 04/12/2021 9:00 [facility] Medical Record Number: [id number] Patient Account Number: [id number] Date of Birth/Sex: Treating RN: 1960-12-01 (60 y.o. Janyth Contes Primary Care Provider: HA YES, RO MA NA Other Clinician: Referring Provider: Treating Provider/Extender: [name] HA YES, RO MA NA Weeks in Treatment: 0 Constitutional respirations regular, non-labored and within target range for patient.. Cardiovascular 2+ dorsalis pedis/posterior tibialis pulses. Notes 2+ pitting edema bilaterally to the lower extremities. No open wounds. No signs of infection. No calf tenderness or increased warmth or erythema to the legs bilaterally. No drainage noted. Electronic Signature(s) Signed: 04/12/2021 11:45:31 AM By: [name] DO Entered By: [name] on 04/12/2021 11:39:41 -------------------------------------------------------------------------------- Physician Orders Details Patient Name: Date of Service: [name], [name] 04/12/2021 9:00 A M Medical Record Number: [id number] Patient Account Number: [id number] Date of Birth/Sex: Treating RN: 1961/02/08 (60 y.o. [name] Primary Care Provider: HA YES, RO MA NA Other Clinician: Referring Provider: Treating Provider/Extender: Lunette Stands, RO MA NA Weeks in Treatment: 0 Verbal / Phone Orders: No Diagnosis Coding ICD-10 Coding Code Description I87.2 Venous insufficiency (chronic) (peripheral) I89.0 Lymphedema, not elsewhere classified Follow-up Appointments Other: - Consult only today, follow up if area re-opens Edema Control - Lymphedema / SCD / Other Avoid standing for long periods of  time. Patient to wear own compression stockings every day. - Continue to wear compression stockings Electronic Signature(s) Signed: 04/12/2021 11:45:31 AM By: [name] DO Entered By: [name] on 04/12/2021 11:41:09 -------------------------------------------------------------------------------- Problem List Details Patient Name: Date of Service: [name], [name] 04/12/2021 9:00 A M Medical Record Number: [id number] Patient Account Number: [id number] Date of Birth/Sex: Treating RN: 1961/06/24 (  60 y.o. Janyth Contes Primary Care Provider: Sung Amabile, RO MA NA Other Clinician: Referring Provider: Treating Provider/Extender: Lunette Stands, RO MA NA Weeks in Treatment: 0 Active Problems ICD-10 Encounter Code Description Active Date MDM Diagnosis I87.2 Venous insufficiency (chronic) (peripheral) 04/12/2021 No Yes I89.0 Lymphedema, not elsewhere classified 04/12/2021 No Yes Inactive Problems Resolved Problems Electronic Signature(s) Signed: 04/12/2021 11:45:31 AM By: [name] DO Entered By: [name] on 04/12/2021 11:35:55 -------------------------------------------------------------------------------- Progress Note Details Patient Name: Date of Service: [name], [name] 04/12/2021 9:00 [facility] Medical Record Number: [id number] Patient Account Number: [id number] Date of Birth/Sex: Treating RN: [birth year] (60 y.o. Janyth Contes Primary Care Provider: HA YES, RO MA NA Other Clinician: Referring Provider: Treating Provider/Extender: Lunette Stands, RO MA NA Weeks in Treatment: 0 Subjective Chief Complaint Information obtained from Patient Left lower extremity swelling with previous wounds that have healed History of Present Illness (HPI) Admission 6/20 [name] is a 60 year old male with a past medical history of CVA, hypertension, type 2 diabetes on insulin complicated by peripheral neuropathy, Chronic venous  insufficiency/lymphedema, and left lower extremity DVT that presents to clinic today for healed wounds of his left lower extremity. He reports over the past month he had increased swelling to his legs due to a decrease in his diuretics. He developed scattered open wounds. He has compression therapy that he wears daily. He reports adjustment to his diuretic and this improved his swelling and thus his wounds healed. He was on a course of clindamycin prescribed by his primary care provider for this issue. He has no complaints today. Patient History Unable to Obtain Patient History due to Altered Mental Status. Information obtained from Patient. Allergies ibuprofen, Aleve, NSAIDS (Non-Steroidal Anti-Inflammatory Drug), Vascepa, coconut flavor Family History Cancer - Mother,Father, Diabetes - Mother,Siblings, Hypertension - Siblings,Mother, Seizures - Mother,Siblings, Stroke - Siblings, No family history of Heart Disease, Hereditary Spherocytosis, Kidney Disease, Lung Disease, Thyroid Problems, Tuberculosis. Social History Never smoker, Marital Status - Divorced, Alcohol Use - Rarely, Drug Use - No History, Caffeine Use - Daily. Medical History Eyes Denies history of Cataracts, Glaucoma, Optic Neuritis Ear/Nose/Mouth/Throat Denies history of Chronic sinus problems/congestion, Middle ear problems Hematologic/Lymphatic Denies history of Anemia, Hemophilia, Human Immunodeficiency Virus, Lymphedema, Sickle Cell Disease Respiratory Denies history of Aspiration, Asthma, Chronic Obstructive Pulmonary Disease (COPD), Pneumothorax, Sleep Apnea, Tuberculosis Cardiovascular Patient has history of Congestive Heart Failure, Coronary Artery Disease, Deep Vein Thrombosis, Hypertension Denies history of Angina, Arrhythmia, Hypotension, Myocardial Infarction, Peripheral Arterial Disease, Peripheral Venous Disease, Phlebitis, Vasculitis Gastrointestinal Denies history of Cirrhosis , Colitis, Crohnoos, Hepatitis  A, Hepatitis B, Hepatitis C Endocrine Patient has history of Type II Diabetes Denies history of Type I Diabetes Genitourinary Denies history of End Stage Renal Disease Immunological Denies history of Lupus Erythematosus, Raynaudoos, Scleroderma Integumentary (Skin) Denies history of History of Burn Musculoskeletal Denies history of Gout, Rheumatoid Arthritis, Osteoarthritis, Osteomyelitis Neurologic Patient has history of Neuropathy Denies history of Dementia, Quadriplegia, Paraplegia, Seizure Disorder Oncologic Denies history of Received Chemotherapy, Received Radiation Psychiatric Denies history of Anorexia/bulimia, Confinement Anxiety Objective Constitutional respirations regular, non-labored and within target range for patient.. Vitals Time Taken: 9:31 AM, Temperature: 98.5 F, Pulse: 67 bpm, Respiratory Rate: 18 breaths/min, Blood Pressure: 134/74 mmHg. Cardiovascular 2+ dorsalis pedis/posterior tibialis pulses. General Notes: 2+ pitting edema bilaterally to the lower extremities. No open wounds. No signs of infection. No calf tenderness or increased warmth or erythema to the legs bilaterally. No drainage noted. Assessment Active Problems ICD-10 Venous  insufficiency (chronic) (peripheral) Lymphedema, not elsewhere classified Patient has been seen in our clinic a couple times over the past couple years for this issue. He has a history of chronic venous insufficiency/lymphedema. He uses compression stockings daily. It appears that a decrease in his diuretics resulted in increased leg swelling. This has been adjusted according to the patient. He has no open wounds today and I recommended continuing compression stockings daily. I asked that he call us with any questions or concerns Plan Follow-up Appointments: Other: - Consult only today, follow up if area re-opens Edema Control - Lymphedema / SCD / Other: Avoid standing for long periods of time. Patient to wear own  compression stockings every day. - Continue to wear compression stockings 1. Compression stockings daily 2. Follow-up as needed Electronic Signature(s) Signed: 04/12/2021 11:45:31 AM By: [name] DO Entered By: [name] on 04/12/2021 11:44:39 -------------------------------------------------------------------------------- HxROS Details Patient Name: Date of Service: [name], [name] 04/12/2021 9:00 A M Medical Record Number: [id number] Patient Account Number: [id number] Date of Birth/Sex: Treating RN: November 19, 1960 (60 y.o. [name] Primary Care Provider: HA YES, RO MA NA Other Clinician: Referring Provider: Treating Provider/Extender: Lunette Stands, RO MA NA Weeks in Treatment: 0 Unable to Obtain Patient History due to Altered Mental Status Information Obtained From Patient Eyes Medical History: Negative for: Cataracts; Glaucoma; Optic Neuritis Ear/Nose/Mouth/Throat Medical History: Negative for: Chronic sinus problems/congestion; Middle ear problems Hematologic/Lymphatic Medical History: Negative for: Anemia; Hemophilia; Human Immunodeficiency Virus; Lymphedema; Sickle Cell Disease Respiratory Medical History: Negative for: Aspiration; Asthma; Chronic Obstructive Pulmonary Disease (COPD); Pneumothorax; Sleep Apnea; Tuberculosis Cardiovascular Medical History: Positive for: Congestive Heart Failure; Coronary Artery Disease; Deep Vein Thrombosis; Hypertension Negative for: Angina; Arrhythmia; Hypotension; Myocardial Infarction; Peripheral Arterial Disease; Peripheral Venous Disease; Phlebitis; Vasculitis Gastrointestinal Medical History: Negative for: Cirrhosis ; Colitis; Crohns; Hepatitis A; Hepatitis B; Hepatitis C Endocrine Medical History: Positive for: Type II Diabetes Negative for: Type I Diabetes Time with diabetes: 10 years Treated with: Insulin, Oral agents Blood sugar tested every day: No Genitourinary Medical  History: Negative for: End Stage Renal Disease Immunological Medical History: Negative for: Lupus Erythematosus; Raynauds; Scleroderma Integumentary (Skin) Medical History: Negative for: History of Burn Musculoskeletal Medical History: Negative for: Gout; Rheumatoid Arthritis; Osteoarthritis; Osteomyelitis Neurologic Medical History: Positive for: Neuropathy Negative for: Dementia; Quadriplegia; Paraplegia; Seizure Disorder Oncologic Medical History: Negative for: Received Chemotherapy; Received Radiation Psychiatric Medical History: Negative for: Anorexia/bulimia; Confinement Anxiety Immunizations Pneumococcal Vaccine: Received Pneumococcal Vaccination: No Implantable Devices None Family and Social History Cancer: Yes - Mother,Father; Diabetes: Yes - Mother,Siblings; Heart Disease: No; Hereditary Spherocytosis: No; Hypertension: Yes - Siblings,Mother; Kidney Disease: No; Lung Disease: No; Seizures: Yes - Mother,Siblings; Stroke: Yes - Siblings; Thyroid Problems: No; Tuberculosis: No; Never smoker; Marital Status - Divorced; Alcohol Use: Rarely; Drug Use: No History; Caffeine Use: Daily; Financial Concerns: No; Food, Clothing or Shelter Needs: No; Support System Lacking: No; Transportation Concerns: No Electronic Signature(s) Signed: 04/12/2021 11:45:31 AM By: [name] DO Signed: 04/12/2021 6:31:05 PM By: Lorrin Jackson Entered By: Lorrin Jackson on 04/12/2021 09:33:29 -------------------------------------------------------------------------------- SuperBill Details Patient Name: Date of Service: [name], [name] 04/12/2021 Medical Record Number: [id number] Patient Account Number: [id number] Date of Birth/Sex: Treating RN: May 13, 1961 (60 y.o. [name] Primary Care Provider: HA YES, RO MA NA Other Clinician: Referring Provider: Treating Provider/Extender: Lunette Stands, RO MA NA Weeks in Treatment: 0 Diagnosis Coding ICD-10 Codes Code  Description I87.2 Venous insufficiency (chronic) (peripheral) I89.0 Lymphedema, not elsewhere classified Facility Procedures  CPT4 Code: [id number] Description: [place] VISIT-LEV 3 EST PT Modifier: 25 Quantity: 1 Physician Procedures : CPT4 Code Description Modifier [id number] - WC PHYS LEVEL 3 - EST PT ICD-10 Diagnosis Description I87.2 Venous insufficiency (chronic) (peripheral) I89.0 Lymphedema, not elsewhere classified Quantity: 1 Electronic Signature(s) Signed: 04/12/2021 11:45:31 AM By: [name] DO Entered By: [name] on 04/12/2021 11:45:06

## 2021-04-23 ENCOUNTER — Encounter: Payer: Self-pay | Admitting: Podiatry

## 2021-04-23 ENCOUNTER — Ambulatory Visit: Payer: Medicaid Other | Admitting: Podiatry

## 2021-04-23 ENCOUNTER — Other Ambulatory Visit: Payer: Self-pay

## 2021-04-23 DIAGNOSIS — E1142 Type 2 diabetes mellitus with diabetic polyneuropathy: Secondary | ICD-10-CM

## 2021-04-23 DIAGNOSIS — M79674 Pain in right toe(s): Secondary | ICD-10-CM | POA: Diagnosis not present

## 2021-04-23 DIAGNOSIS — N179 Acute kidney failure, unspecified: Secondary | ICD-10-CM

## 2021-04-23 DIAGNOSIS — M79675 Pain in left toe(s): Secondary | ICD-10-CM | POA: Diagnosis not present

## 2021-04-23 DIAGNOSIS — B351 Tinea unguium: Secondary | ICD-10-CM

## 2021-04-23 NOTE — Progress Notes (Signed)
This patient presents  to my office for at risk foot care.  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  This patient presents for at risk foot care today.  General Appearance  Alert, conversant and in no acute stress.  Vascular  Dorsalis pedis   pulses are palpable  bilaterally. Posterior tibial pulses are absent due to swelling. Capillary return is within normal limits  bilaterally. Temperature is within normal limits  bilaterally.  Neurologic  Senn-Weinstein monofilament wire test within normal limits  bilaterally. Muscle power within normal limits bilaterally.  Nails Thick disfigured discolored nails with subungual debris  from hallux to fifth toes bilaterally. No evidence of bacterial infection or drainage bilaterally.  Orthopedic  No limitations of motion  feet .  No crepitus or effusions noted.  No bony pathology or digital deformities noted.  Skin  normotropic skin with no porokeratosis noted bilaterally.  No signs of infections or ulcers noted.     Onychomycosis  Pain in right toes  Pain in left toes  Consent was obtained for treatment procedures.   Mechanical debridement of nails 1-5  bilaterally performed with a nail nipper.  Filed with dremel without incident.    Return office visit    3 months                 Told patient to return for periodic foot care and evaluation due to potential at risk complications.   Gardiner Barefoot DPM

## 2021-05-28 ENCOUNTER — Emergency Department (HOSPITAL_COMMUNITY)
Admission: EM | Admit: 2021-05-28 | Discharge: 2021-05-28 | Disposition: A | Discharge status: Home / Self Care | Payer: Medicaid Other | Attending: Emergency Medicine | Admitting: Emergency Medicine

## 2021-05-28 ENCOUNTER — Other Ambulatory Visit: Payer: Self-pay

## 2021-05-28 DIAGNOSIS — Z7984 Long term (current) use of oral hypoglycemic drugs: Secondary | ICD-10-CM | POA: Diagnosis not present

## 2021-05-28 DIAGNOSIS — Z7982 Long term (current) use of aspirin: Secondary | ICD-10-CM | POA: Insufficient documentation

## 2021-05-28 DIAGNOSIS — Z794 Long term (current) use of insulin: Secondary | ICD-10-CM | POA: Insufficient documentation

## 2021-05-28 DIAGNOSIS — I110 Hypertensive heart disease with heart failure: Secondary | ICD-10-CM | POA: Insufficient documentation

## 2021-05-28 DIAGNOSIS — X58XXXA Exposure to other specified factors, initial encounter: Secondary | ICD-10-CM | POA: Diagnosis not present

## 2021-05-28 DIAGNOSIS — Z79899 Other long term (current) drug therapy: Secondary | ICD-10-CM | POA: Insufficient documentation

## 2021-05-28 DIAGNOSIS — S81801A Unspecified open wound, right lower leg, initial encounter: Secondary | ICD-10-CM | POA: Diagnosis not present

## 2021-05-28 DIAGNOSIS — I2510 Atherosclerotic heart disease of native coronary artery without angina pectoris: Secondary | ICD-10-CM | POA: Diagnosis not present

## 2021-05-28 DIAGNOSIS — E1110 Type 2 diabetes mellitus with ketoacidosis without coma: Secondary | ICD-10-CM | POA: Diagnosis not present

## 2021-05-28 DIAGNOSIS — I5032 Chronic diastolic (congestive) heart failure: Secondary | ICD-10-CM | POA: Diagnosis not present

## 2021-05-28 DIAGNOSIS — E1142 Type 2 diabetes mellitus with diabetic polyneuropathy: Secondary | ICD-10-CM | POA: Insufficient documentation

## 2021-05-28 MED ORDER — ACETAMINOPHEN 325 MG PO TABS: 650.0000 mg | ORAL_TABLET | Freq: Once | ORAL | Status: DC

## 2021-05-28 NOTE — ED Triage Notes (Signed)
Open wound to right lower leg. Pt goes to [facility] health wound care center but it was closed, plans to go back on Monday.   No fever, chills. Clear discharge noted from wound when palpated.

## 2021-05-28 NOTE — Discharge Instructions (Addendum)
[facility] to be evaluated for your right knee.  Your physical exam is reassuring please follow-up with the wound clinic for your primary care provider for wound recheck.  Get help right away if: You have a red streak of skin near the area around your wound. Your wound has been closed with staples, sutures, skin glue, or adhesive strips and it begins to open up and separate. Your wound is bleeding, and the bleeding does not stop with gentle pressure. You have a rash. You faint. You have trouble breathing.

## 2021-05-28 NOTE — ED Provider Notes (Signed)
[facility] EMERGENCY DEPARTMENT Provider Note   CSN: [id number] Arrival date & time: 05/28/21  2027     History Chief Complaint  Patient presents with   Wound Check    [name] is a 60 y.o. male with medical history as noted below.  Patient presents to the emergency department with a chief complaint of wound to right lower extremity.  Patient states that he first noticed this earlier today.  Patient denies any traumatic injury.  Patient denies any pain, numbness, weakness, purulent discharge, pruritus, erythema, fever, chills, nausea vomiting.   Wound Check      Past Medical History:  Diagnosis Date   Anemia    Arthritis    Back pain    CAD (coronary artery disease)    a. s/p DES to LAD in 05/2016   Cervical radiculopathy    Chronic diastolic CHF (congestive heart failure) (HCC)    Chronic pain    Depression    DVT (deep venous thrombosis) (HCC)    Hematemesis    Hepatic steatosis    Hyperlipidemia    Hypertension    IBS (irritable bowel syndrome)    Morbid obesity (HCC)    OSA (obstructive sleep apnea)    Pancreatitis    PE (pulmonary thromboembolism) ([place])    Peripheral neuropathy    PUD (peptic ulcer disease)    Renal disorder    Stroke [facility])    a. ?details unclear - not seen on imaging when he was admitted in 05/2017 for TIA symptoms which were felt due to cervical radiculopathy.   Thoracic aortic ectasia (HCC)    a. 4.3cm ectatic ascending thoracic aorta by CT 06/2017.    Type 2 diabetes mellitus ([place])     Patient Active Problem List   Diagnosis Date Noted   Lumbar radiculitis 03/30/2021   Morbid obesity ([place]) 01/15/2021   Personal history of colonic polyps 01/15/2021   Rectal bleeding 01/15/2021   Pain due to onychomycosis of toenails of both feet 01/15/2021   Epistaxis, recurrent 10/21/2020   Laceration of nose 10/21/2020   Syncope 10/15/2020   OSA (obstructive sleep apnea)    Bilateral carotid artery stenosis 07/28/2020    Bilateral lower extremity edema 07/28/2020   CHF (congestive heart failure) ([place]) 07/28/2020   DDD (degenerative disc disease), lumbar 07/28/2020   Diabetic peripheral neuropathy ([place]) 07/28/2020   Fatty liver 07/28/2020   Irritable bowel syndrome with diarrhea 07/28/2020   Osteoarthritis of right hip 07/28/2020   Upper GI bleed 04/28/2020   Hypertensive urgency 04/27/2020   Acute GI bleeding 04/27/2020   Mixed diabetic hyperlipidemia associated with type 2 diabetes mellitus ([place]) 04/27/2020   Rotator cuff arthropathy 10/30/2019   Cervical radiculopathy 09/24/2019   Trochanteric bursitis of right hip 09/24/2019   Chronic diastolic CHF (congestive heart failure) ([place]) 08/23/2019   ARF (acute renal failure) ([place]) 08/01/2019   Hyperkalemia 08/01/2019   Uncontrolled type 2 diabetes mellitus with hyperglycemia ([place]) 08/01/2019   AKI (acute kidney injury) ([place])    Hypotension 07/31/2019   DKA (diabetic ketoacidosis) ([place]) 05/05/2019   Blurred vision 05/05/2019   Trochanteric bursitis 02/11/2019   Long-term insulin use ([place]) 01/25/2019   Alteration consciousness 11/19/2018   Neck pain 10/09/2018   Left arm weakness 10/09/2018   B12 deficiency 08/10/2017   Persistent headaches 08/09/2017   GERD (gastroesophageal reflux disease) 06/29/2017   Left arm numbness    Cerebral embolism with cerebral infarction 06/17/2017   TIA (transient ischemic attack) 06/17/2017  Chronic back pain 12/29/2016   Depression 12/15/2016   Vitamin D deficiency 12/09/2016   Right hip pain 12/07/2016   Hypokalemia 12/07/2016   Type 2 diabetes mellitus with vascular disease ([place]) 05/31/2016   Normocytic normochromic anemia 05/31/2016   Chest pain 05/31/2016   Coronary artery disease involving native coronary artery of native heart without angina pectoris 01/06/2016   DVT (deep venous thrombosis) ([place]) 01/06/2016   Lactic acidosis 05/28/2014   Nonspecific chest pain 01/29/2014   Uncontrolled secondary  diabetes with peripheral neuropathy ([place]) 01/29/2014   Obesity, Class III, BMI 40-49.9 (morbid obesity) ([place]) 01/29/2014   Snoring 01/29/2014   Dyslipidemia 01/29/2014   HTN (hypertension) 01/29/2014   Abnormal nuclear stress test 01/29/2014    Past Surgical History:  Procedure Laterality Date   CARDIAC CATHETERIZATION N/A 05/31/2016   Procedure: Left Heart Cath and Coronary Angiography;  Surgeon: Lesleyann Fichter M [place], MD;  Location: [place] CV [facility];  Service: Cardiovascular;  Laterality: N/A;   CARDIAC CATHETERIZATION N/A 05/31/2016   Procedure: Intravascular Pressure Wire/FFR Study;  Surgeon: Demetress Tift M [place], MD;  Location: [place] [facility];  Service: Cardiovascular;  Laterality: N/A;   CARDIAC CATHETERIZATION N/A 05/31/2016   Procedure: Coronary Stent Intervention;  Surgeon: Lummie Montijo M [place], MD;  Location: [place] [facility];  Service: Cardiovascular;  Laterality: N/A;   COLONOSCOPY WITH PROPOFOL N/A 04/29/2020   Procedure: COLONOSCOPY WITH PROPOFOL;  Surgeon: Wilford Corner, MD;  Location: [place];  Service: Endoscopy;  Laterality: N/A;   ESOPHAGOGASTRODUODENOSCOPY N/A 04/29/2020   Procedure: ESOPHAGOGASTRODUODENOSCOPY (EGD);  Surgeon: Wilford Corner, MD;  Location: [place];  Service: Endoscopy;  Laterality: N/A;   LEFT HEART CATH AND CORONARY ANGIOGRAPHY N/A 12/08/2017   Procedure: LEFT HEART CATH AND CORONARY ANGIOGRAPHY;  Surgeon: Leonie Man, MD;  Location: [place] [facility];  Service: Cardiovascular;  Laterality: N/A;   LEFT HEART CATHETERIZATION WITH CORONARY ANGIOGRAM N/A 02/03/2014   Procedure: LEFT HEART CATHETERIZATION WITH CORONARY ANGIOGRAM;  Surgeon: Pixie Casino, MD;  Location: [facility] CATH LAB;  Service: Cardiovascular;  Laterality: N/A;   left leg stent      POLYPECTOMY  04/29/2020   Procedure: POLYPECTOMY;  Surgeon: Wilford Corner, MD;  Location: [facility] ENDOSCOPY;  Service: Endoscopy;;       Family History  Problem Relation Age of Onset   Cancer Father     Hypertension Mother    Diabetes Mother    Breast cancer Mother    Hypertension Brother    Diabetes Brother    Hypertension Sister    Diabetes Sister     Social History   Tobacco Use   Smoking status: Never   Smokeless tobacco: Never  Vaping Use   Vaping Use: Never used  Substance Use Topics   Alcohol use: No   Drug use: No    Home Medications Prior to Admission medications   Medication Sig Start Date End Date Taking? Authorizing Provider  Accu-Chek FastClix Lancets MISC 1 Device by Other route 2 (two) times daily.  03/20/19   [provider]  ACCU-CHEK GUIDE test strip  01/08/21   [provider]  albuterol (VENTOLIN HFA) 108 (90 Base) MCG/ACT inhaler Inhale 2 puffs into the lungs every 6 (six) hours as needed for wheezing or shortness of breath. As Directed    [provider]  allopurinol (ZYLOPRIM) 100 MG tablet Take 100 mg by mouth daily. 03/31/20   [provider]  aspirin 81 MG chewable tablet Chew 1 tablet (81 mg total) by mouth  daily. 06/01/16   Elwin Mocha, MD  atorvastatin (LIPITOR) 40 MG tablet Take 40 mg by mouth daily. 1 tablet at bedtime    [provider]  carvedilol (COREG) 12.5 MG tablet Take 12.5 mg by mouth 2 (two) times daily. 10/22/20   [provider]  clindamycin (CLEOCIN) 300 MG capsule Take 300 mg by mouth 2 (two) times daily. 03/18/21   [provider]  diclofenac (FLECTOR) 1.3 % PTCH Place 1 patch onto the skin daily as needed for pain. 03/02/20   [provider]  famotidine (PEPCID) 20 MG tablet Take 20 mg by mouth daily. 03/05/20   [provider]  fenofibrate (TRICOR) 145 MG tablet Take 1 tablet (145 mg total) by mouth daily. 06/18/20   Hilty, Nadean Corwin, MD  furosemide (LASIX) 40 MG tablet Take 1 tablet (40 mg total) by mouth daily. PLEASE RESUME ONLY FROM 10/21/2020. DOSE HAS BEEN CHANGED. 10/21/20 10/21/21  Bonnielee Haff, MD  gabapentin (NEURONTIN) 400 MG capsule TAKE 1  (ONE) CAPSULE THREE TIMES DAILY FOR NEUROPATHY 11/24/20   [provider]  glipiZIDE (GLUCOTROL) 10 MG tablet Take 10 mg by mouth 2 (two) times daily. 05/30/17   [provider]  HUMULIN R U-500 KWIKPEN 500 UNIT/ML kwikpen Inject 0-75 Units into the skin See admin instructions. Inject under the skin three times daily as directed or sliding scale: 65 units in the morning, 70 units at lunch, 75 units at bedtime 06/04/20   [provider]  Insulin Pen Needle 31G X 5 MM MISC Use 1 needle daily to inject insulin as prescribed Patient taking differently: 1 each. Use 1 needle daily to inject insulin as prescribed 06/18/17   Barton Dubois, MD  isosorbide mononitrate (IMDUR) 30 MG 24 hr tablet TAKE 1 TABLET BY MOUTH DAILY 12/24/20   Hilty, Nadean Corwin, MD  lisinopril (ZESTRIL) 5 MG tablet Take 5 mg by mouth daily. 12/04/20   [provider]  mupirocin ointment (BACTROBAN) 2 % Apply topically 3 (three) times daily. 03/18/21   [provider]  naloxone [facility]) nasal spray 4 mg/0.1 mL SMARTSIG:Both Nares 02/22/21   [provider]  nitroGLYCERIN (NITROSTAT) 0.4 MG SL tablet Place 1 tablet (0.4 mg total) under the tongue every 5 (five) minutes as needed for chest pain. 02/06/19   Skeet Latch, MD  ondansetron (ZOFRAN) 4 MG tablet Take 4 mg by mouth 2 (two) times daily as needed for nausea/vomiting. 04/21/20   [provider]  oxyCODONE (ROXICODONE) 15 MG immediate release tablet Take 15 mg 3 (three) times daily as needed by mouth for pain.  08/28/17   [provider]  pantoprazole (PROTONIX) 40 MG tablet Take 40 mg by mouth daily. 04/13/21   [provider]  polyethylene glycol-electrolytes (NULYTELY) 420 g solution DISPENSE ONE FOUR LITER CONTAINER. USE AS DIRECTED 12/22/20   [provider]  potassium chloride (MICRO-K) 10 MEQ CR capsule Take 10 mEq by mouth in the morning and at bedtime. 04/03/20   [provider]  sertraline  (ZOLOFT) 50 MG tablet Take 50 mg by mouth daily. 12/17/20   [provider]  tiZANidine (ZANAFLEX) 4 MG tablet Take 4 mg by mouth at bedtime as needed for muscle spasms. 03/31/20   [provider]  traZODone (DESYREL) 50 MG tablet Take 1 tablet (50 mg total) by mouth at bedtime. 12/15/16   Florinda Marker, MD  Vitamin D, Ergocalciferol, (DRISDOL) 1.25 MG (50000 UT) CAPS capsule Take 50,000 Units by mouth every Monday.  08/15/19   [provider]    Allergies    Coconut flavor [flavoring agent], Coconut oil, Ibuprofen, Aleve [naproxen], Nsaids, and Vascepa [icosapent ethyl]  Review of Systems   Review of Systems  Constitutional:  Negative for chills and fever.  Gastrointestinal:  Negative for nausea and vomiting.  Musculoskeletal:  Negative for myalgias.  Skin:  Positive for wound. Negative for color change, pallor and rash.  Neurological:  Negative for weakness and numbness.   Physical Exam Updated Vital Signs BP (!) 165/87 (BP Location: Right Wrist)   Pulse 77   Temp 98.1 F (36.7 C) (Oral)   Resp 18   Ht '5\' 10"'$  (1.778 m)   Wt (!) 148 kg   SpO2 94%   BMI 46.82 kg/m   Physical Exam Vitals and nursing note reviewed.  Constitutional:      General: He is not in acute distress.    Appearance: He is not ill-appearing, toxic-appearing or diaphoretic.  HENT:     Head: Normocephalic.  Eyes:     General: No scleral icterus.       Right eye: No discharge.        Left eye: No discharge.  Cardiovascular:     Rate and Rhythm: Normal rate.     Pulses:          Dorsalis pedis pulses are 2+ on the right side.  Pulmonary:     Effort: Pulmonary effort is normal.  Musculoskeletal:     Comments: Chronic venous stasis changes to bilateral lower extremities.  Patient has 1 cm to anterior aspect of right lower extremity.  No surrounding fluctuance, induration, erythema, or redness.  While palpating a minimal amount of clear fluid was expressed.  No tenderness to  palpation.  Feet:     Right foot:     Skin integrity: Callus and dry skin present. No ulcer, blister, skin breakdown, erythema, warmth or fissure.     Toenail Condition: Right toenails are abnormally thick.  Skin:    General: Skin is warm and dry.  Neurological:     General: No focal deficit present.     Mental Status: He is alert.  Psychiatric:        Behavior: Behavior is cooperative.      ED Results / Procedures / Treatments   Labs (all labs ordered are listed, but only abnormal results are displayed) Labs Reviewed - No data to display  EKG None  Radiology No results found.  Procedures Procedures   Medications Ordered in ED Medications - No data to display  ED Course  I have reviewed the triage vital signs and the nursing notes.  Pertinent labs & imaging results that were available during my care of the patient were reviewed by me and considered in my medical decision making (see chart for details).    MDM Rules/Calculators/A&P                           Alert 80 male in no acute distress, nontoxic appearing.  Presents to the emergency department with a chief complaint of right lower leg wound.  Patient denies any systemic symptoms.  Patient denies any wounds or injuries.  Low suspicion for cellulitis as no surrounding erythema or warmth. Low suspicion for abscess as no fluctuance or induration.  Will discharge patient at this time.  Patient to follow-up with primary care provider or wound care clinic for wound recheck.  Patient given strict return precautions.  Patient expressed understanding of all instructions and is agreeable with this plan.  Final Clinical Impression(s) / ED Diagnoses Final diagnoses:  Wound of right lower extremity, initial encounter    Rx / DC Orders ED Discharge Orders     None        Dyann Ruddle 05/28/21 [id number]    Drenda Freeze, MD 05/29/21 [phone]

## 2021-06-05 ENCOUNTER — Emergency Department (HOSPITAL_COMMUNITY): Payer: Medicaid Other

## 2021-06-05 ENCOUNTER — Other Ambulatory Visit: Payer: Self-pay

## 2021-06-05 ENCOUNTER — Emergency Department (HOSPITAL_COMMUNITY)
Admission: EM | Admit: 2021-06-05 | Discharge: 2021-06-05 | Disposition: A | Discharge status: Home / Self Care | Payer: Medicaid Other | Attending: Emergency Medicine | Admitting: Emergency Medicine

## 2021-06-05 ENCOUNTER — Encounter (HOSPITAL_COMMUNITY): Payer: Self-pay | Admitting: Emergency Medicine

## 2021-06-05 DIAGNOSIS — I110 Hypertensive heart disease with heart failure: Secondary | ICD-10-CM | POA: Diagnosis not present

## 2021-06-05 DIAGNOSIS — E1142 Type 2 diabetes mellitus with diabetic polyneuropathy: Secondary | ICD-10-CM | POA: Insufficient documentation

## 2021-06-05 DIAGNOSIS — Z20822 Contact with and (suspected) exposure to covid-19: Secondary | ICD-10-CM | POA: Diagnosis not present

## 2021-06-05 DIAGNOSIS — Z7982 Long term (current) use of aspirin: Secondary | ICD-10-CM | POA: Diagnosis not present

## 2021-06-05 DIAGNOSIS — I2510 Atherosclerotic heart disease of native coronary artery without angina pectoris: Secondary | ICD-10-CM | POA: Diagnosis not present

## 2021-06-05 DIAGNOSIS — Z7984 Long term (current) use of oral hypoglycemic drugs: Secondary | ICD-10-CM | POA: Insufficient documentation

## 2021-06-05 DIAGNOSIS — R0602 Shortness of breath: Secondary | ICD-10-CM

## 2021-06-05 DIAGNOSIS — I509 Heart failure, unspecified: Secondary | ICD-10-CM | POA: Diagnosis not present

## 2021-06-05 DIAGNOSIS — Z79899 Other long term (current) drug therapy: Secondary | ICD-10-CM | POA: Insufficient documentation

## 2021-06-05 DIAGNOSIS — Z794 Long term (current) use of insulin: Secondary | ICD-10-CM | POA: Diagnosis not present

## 2021-06-05 LAB — URINALYSIS, ROUTINE W REFLEX MICROSCOPIC
Bacteria, UA: NONE SEEN
Bilirubin Urine: NEGATIVE
Glucose, UA: 150 mg/dL — AB
Ketones, ur: NEGATIVE mg/dL
Leukocytes,Ua: NEGATIVE
Nitrite: NEGATIVE
Protein, ur: NEGATIVE mg/dL
Specific Gravity, Urine: 1.005 (ref 1.005–1.030)
pH: 7 (ref 5.0–8.0)

## 2021-06-05 LAB — CBC WITH DIFFERENTIAL/PLATELET
Abs Immature Granulocytes: 0.04 10*3/uL (ref 0.00–0.07)
Basophils Absolute: 0 10*3/uL (ref 0.0–0.1)
Basophils Relative: 0 %
Eosinophils Absolute: 0.1 10*3/uL (ref 0.0–0.5)
Eosinophils Relative: 1 %
HCT: 35.4 % — ABNORMAL LOW (ref 39.0–52.0)
Hemoglobin: 11.9 g/dL — ABNORMAL LOW (ref 13.0–17.0)
Immature Granulocytes: 0 %
Lymphocytes Relative: 20 %
Lymphs Abs: 1.8 10*3/uL (ref 0.7–4.0)
MCH: 31.6 pg (ref 26.0–34.0)
MCHC: 33.6 g/dL (ref 30.0–36.0)
MCV: 94.1 fL (ref 80.0–100.0)
Monocytes Absolute: 0.6 10*3/uL (ref 0.1–1.0)
Monocytes Relative: 6 %
Neutro Abs: 6.5 10*3/uL (ref 1.7–7.7)
Neutrophils Relative %: 73 %
Platelets: 210 10*3/uL (ref 150–400)
RBC: 3.76 MIL/uL — ABNORMAL LOW (ref 4.22–5.81)
RDW: 14.1 % (ref 11.5–15.5)
WBC: 9 10*3/uL (ref 4.0–10.5)
nRBC: 0 % (ref 0.0–0.2)

## 2021-06-05 LAB — COMPREHENSIVE METABOLIC PANEL
ALT: 46 U/L — ABNORMAL HIGH (ref 0–44)
AST: 60 U/L — ABNORMAL HIGH (ref 15–41)
Albumin: 3.7 g/dL (ref 3.5–5.0)
Alkaline Phosphatase: 42 U/L (ref 38–126)
Anion gap: 12 (ref 5–15)
BUN: 14 mg/dL (ref 6–20)
CO2: 26 mmol/L (ref 22–32)
Calcium: 9.1 mg/dL (ref 8.9–10.3)
Chloride: 104 mmol/L (ref 98–111)
Creatinine, Ser: 0.83 mg/dL (ref 0.61–1.24)
GFR, Estimated: >60 mL/min (ref >60)
Glucose, Bld: 250 mg/dL — ABNORMAL HIGH (ref 70–99)
Potassium: 3.5 mmol/L (ref 3.5–5.1)
Sodium: 142 mmol/L (ref 135–145)
Total Bilirubin: 0.8 mg/dL (ref 0.3–1.2)
Total Protein: 6.8 g/dL (ref 6.5–8.1)

## 2021-06-05 LAB — RESP PANEL BY RT-PCR (FLU A&B, COVID) ARPGX2
Influenza A by PCR: NEGATIVE
Influenza B by PCR: NEGATIVE
SARS Coronavirus 2 by RT PCR: NEGATIVE

## 2021-06-05 LAB — TROPONIN I (HIGH SENSITIVITY)
Troponin I (High Sensitivity): 6 ng/L (ref <18)
Troponin I (High Sensitivity): 6 ng/L (ref <18)

## 2021-06-05 LAB — BRAIN NATRIURETIC PEPTIDE: B Natriuretic Peptide: 31.9 pg/mL (ref 0.0–100.0)

## 2021-06-05 MED ORDER — FUROSEMIDE 10 MG/ML IJ SOLN
40.0000 mg | Freq: Once | INTRAMUSCULAR | Status: AC
  Administered 2021-06-05: 40 mg via INTRAVENOUS
  Filled 2021-06-05: qty 4

## 2021-06-05 MED ORDER — POTASSIUM CHLORIDE CRYS ER 20 MEQ PO TBCR
40.0000 meq | EXTENDED_RELEASE_TABLET | Freq: Once | ORAL | Status: AC
  Administered 2021-06-05: 40 meq via ORAL
  Filled 2021-06-05: qty 2

## 2021-06-05 NOTE — ED Triage Notes (Signed)
Pt states he has been SOB x 2 weeks. States he has was dx with fluid on his lungs and feels worse today. Chest pain. Alert and oriented.

## 2021-06-05 NOTE — Discharge Instructions (Addendum)
Please continue taking your Lasix.  I would also recommend following up with your cardiologist as well as the heart failure clinic regarding your symptoms.  If you develop any new or worsening symptoms please come back to the emergency department immediately.  It was a pleasure to meet you.

## 2021-06-05 NOTE — ED Provider Notes (Signed)
Why DEPT Provider Note   CSN: [id number] Arrival date & time: 06/05/21  1101     History Chief Complaint  Patient presents with   Shortness of [place] is a 60 y.o. male.  HPI Patient is a 60 year old male with a history of CAD, chronic diastolic CHF, hyperlipidemia, hypertension, morbid obesity, OSA, PE/DVT, who presents to the emergency department due to worsening shortness of breath for the past 2 weeks.  Patient states that he had a chest x-ray yesterday concerning for pulmonary edema and came to the emergency department today because he has continued to have worsening shortness of breath.  States that his symptoms worsen with any exertion.  Reports associated left-sided chest tightness.  Records indicate that he takes 40 mg of Lasix once daily but he states that he is taking this 3 times daily without relief.  Patient states he sleeps in a recliner due to chronic orthopnea.  Echocardiogram on October 15, 2020 shows an LVEF of 55% to 60%.    Past Medical History:  Diagnosis Date   Anemia    Arthritis    Back pain    CAD (coronary artery disease)    a. s/p DES to LAD in 05/2016   Cervical radiculopathy    Chronic diastolic CHF (congestive heart failure) (HCC)    Chronic pain    Depression    DVT (deep venous thrombosis) (HCC)    Hematemesis    Hepatic steatosis    Hyperlipidemia    Hypertension    IBS (irritable bowel syndrome)    Morbid obesity (HCC)    OSA (obstructive sleep apnea)    Pancreatitis    PE (pulmonary thromboembolism) ([place])    Peripheral neuropathy    PUD (peptic ulcer disease)    Renal disorder    Stroke [facility])    a. ?details unclear - not seen on imaging when he was admitted in 05/2017 for TIA symptoms which were felt due to cervical radiculopathy.   Thoracic aortic ectasia (HCC)    a. 4.3cm ectatic ascending thoracic aorta by CT 06/2017.    Type 2 diabetes mellitus [facility])     Patient Active  Problem List   Diagnosis Date Noted   Lumbar radiculitis 03/30/2021   Morbid obesity ([place]) 01/15/2021   Personal history of colonic polyps 01/15/2021   Rectal bleeding 01/15/2021   Pain due to onychomycosis of toenails of both feet 01/15/2021   Epistaxis, recurrent 10/21/2020   Laceration of nose 10/21/2020   Syncope 10/15/2020   OSA (obstructive sleep apnea)    Bilateral carotid artery stenosis 07/28/2020   Bilateral lower extremity edema 07/28/2020   CHF (congestive heart failure) ([place]) 07/28/2020   DDD (degenerative disc disease), lumbar 07/28/2020   Diabetic peripheral neuropathy ([place]) 07/28/2020   Fatty liver 07/28/2020   Irritable bowel syndrome with diarrhea 07/28/2020   Osteoarthritis of right hip 07/28/2020   Upper GI bleed 04/28/2020   Hypertensive urgency 04/27/2020   Acute GI bleeding 04/27/2020   Mixed diabetic hyperlipidemia associated with type 2 diabetes mellitus ([place]) 04/27/2020   Rotator cuff arthropathy 10/30/2019   Cervical radiculopathy 09/24/2019   Trochanteric bursitis of right hip 09/24/2019   Chronic diastolic CHF (congestive heart failure) ([place]) 08/23/2019   ARF (acute renal failure) ([place]) 08/01/2019   Hyperkalemia 08/01/2019   Uncontrolled type 2 diabetes mellitus with hyperglycemia ([place]) 08/01/2019   AKI (acute kidney injury) ([place])    Hypotension 07/31/2019   DKA (diabetic ketoacidosis) ([place])  05/05/2019   Blurred vision 05/05/2019   Trochanteric bursitis 02/11/2019   Long-term insulin use ([place]) 01/25/2019   Alteration consciousness 11/19/2018   Neck pain 10/09/2018   Left arm weakness 10/09/2018   B12 deficiency 08/10/2017   Persistent headaches 08/09/2017   GERD (gastroesophageal reflux disease) 06/29/2017   Left arm numbness    Cerebral embolism with cerebral infarction 06/17/2017   TIA (transient ischemic attack) 06/17/2017   Chronic back pain 12/29/2016   Depression 12/15/2016   Vitamin D deficiency 12/09/2016   Right hip pain 12/07/2016    Hypokalemia 12/07/2016   Type 2 diabetes mellitus with vascular disease ([place]) 05/31/2016   Normocytic normochromic anemia 05/31/2016   Chest pain 05/31/2016   Coronary artery disease involving native coronary artery of native heart without angina pectoris 01/06/2016   DVT (deep venous thrombosis) ([place]) 01/06/2016   Lactic acidosis 05/28/2014   Nonspecific chest pain 01/29/2014   Uncontrolled secondary diabetes with peripheral neuropathy ([place]) 01/29/2014   Obesity, Class III, BMI 40-49.9 (morbid obesity) ([place]) 01/29/2014   Snoring 01/29/2014   Dyslipidemia 01/29/2014   HTN (hypertension) 01/29/2014   Abnormal nuclear stress test 01/29/2014    Past Surgical History:  Procedure Laterality Date   CARDIAC CATHETERIZATION N/A 05/31/2016   Procedure: Left Heart Cath and Coronary Angiography;  Surgeon: Peter M Martinique, MD;  Location: [place] [facility];  Service: Cardiovascular;  Laterality: N/A;   CARDIAC CATHETERIZATION N/A 05/31/2016   Procedure: Intravascular Pressure Wire/FFR Study;  Surgeon: Peter M Martinique, MD;  Location: [place] [facility];  Service: Cardiovascular;  Laterality: N/A;   CARDIAC CATHETERIZATION N/A 05/31/2016   Procedure: Coronary Stent Intervention;  Surgeon: Peter M Martinique, MD;  Location: [place] [facility];  Service: Cardiovascular;  Laterality: N/A;   COLONOSCOPY WITH PROPOFOL N/A 04/29/2020   Procedure: COLONOSCOPY WITH PROPOFOL;  Surgeon: Wilford Corner, MD;  Location: [place];  Service: Endoscopy;  Laterality: N/A;   ESOPHAGOGASTRODUODENOSCOPY N/A 04/29/2020   Procedure: ESOPHAGOGASTRODUODENOSCOPY (EGD);  Surgeon: Wilford Corner, MD;  Location: [place];  Service: Endoscopy;  Laterality: N/A;   LEFT HEART CATH AND CORONARY ANGIOGRAPHY N/A 12/08/2017   Procedure: LEFT HEART CATH AND CORONARY ANGIOGRAPHY;  Surgeon: Leonie Man, MD;  Location: [place] [facility];  Service: Cardiovascular;  Laterality: N/A;   LEFT HEART CATHETERIZATION WITH CORONARY ANGIOGRAM  N/A 02/03/2014   Procedure: LEFT HEART CATHETERIZATION WITH CORONARY ANGIOGRAM;  Surgeon: Pixie Casino, MD;  Location: [facility] CATH LAB;  Service: Cardiovascular;  Laterality: N/A;   left leg stent      POLYPECTOMY  04/29/2020   Procedure: POLYPECTOMY;  Surgeon: Wilford Corner, MD;  Location: [facility] ENDOSCOPY;  Service: Endoscopy;;       Family History  Problem Relation Age of Onset   Cancer Father    Hypertension Mother    Diabetes Mother    Breast cancer Mother    Hypertension Brother    Diabetes Brother    Hypertension Sister    Diabetes Sister     Social History   Tobacco Use   Smoking status: Never   Smokeless tobacco: Never  Vaping Use   Vaping Use: Never used  Substance Use Topics   Alcohol use: No   Drug use: No    Home Medications Prior to Admission medications   Medication Sig Start Date End Date Taking? Authorizing Provider  Accu-Chek FastClix Lancets MISC 1 Device by Other route 2 (two) times daily.  03/20/19   [provider]  ACCU-CHEK GUIDE test strip  01/08/21   [provider]  albuterol (VENTOLIN HFA) 108 (90 Base) MCG/ACT inhaler Inhale 2 puffs into the lungs every 6 (six) hours as needed for wheezing or shortness of breath. As Directed    [provider]  allopurinol (ZYLOPRIM) 100 MG tablet Take 100 mg by mouth daily. 03/31/20   [provider]  aspirin 81 MG chewable tablet Chew 1 tablet (81 mg total) by mouth daily. 06/01/16   Elwin Mocha, MD  atorvastatin (LIPITOR) 40 MG tablet Take 40 mg by mouth daily. 1 tablet at bedtime    [provider]  carvedilol (COREG) 12.5 MG tablet Take 12.5 mg by mouth 2 (two) times daily. 10/22/20   [provider]  clindamycin (CLEOCIN) 300 MG capsule Take 300 mg by mouth 2 (two) times daily. 03/18/21   [provider]  diclofenac (FLECTOR) 1.3 % PTCH Place 1 patch onto the skin daily as needed for pain. 03/02/20   [provider]  famotidine (PEPCID) 20  MG tablet Take 20 mg by mouth daily. 03/05/20   [provider]  fenofibrate (TRICOR) 145 MG tablet Take 1 tablet (145 mg total) by mouth daily. 06/18/20   Hilty, Nadean Corwin, MD  furosemide (LASIX) 40 MG tablet Take 1 tablet (40 mg total) by mouth daily. PLEASE RESUME ONLY FROM 10/21/2020. DOSE HAS BEEN CHANGED. 10/21/20 10/21/21  Bonnielee Haff, MD  gabapentin (NEURONTIN) 400 MG capsule TAKE 1 (ONE) CAPSULE THREE TIMES DAILY FOR NEUROPATHY 11/24/20   [provider]  glipiZIDE (GLUCOTROL) 10 MG tablet Take 10 mg by mouth 2 (two) times daily. 05/30/17   [provider]  HUMULIN R U-500 KWIKPEN 500 UNIT/ML kwikpen Inject 0-75 Units into the skin See admin instructions. Inject under the skin three times daily as directed or sliding scale: 65 units in the morning, 70 units at lunch, 75 units at bedtime 06/04/20   [provider]  Insulin Pen Needle 31G X 5 MM MISC Use 1 needle daily to inject insulin as prescribed Patient taking differently: 1 each. Use 1 needle daily to inject insulin as prescribed 06/18/17   Barton Dubois, MD  isosorbide mononitrate (IMDUR) 30 MG 24 hr tablet TAKE 1 TABLET BY MOUTH DAILY 12/24/20   Hilty, Nadean Corwin, MD  lisinopril (ZESTRIL) 5 MG tablet Take 5 mg by mouth daily. 12/04/20   [provider]  mupirocin ointment (BACTROBAN) 2 % Apply topically 3 (three) times daily. 03/18/21   [provider]  naloxone [facility]) nasal spray 4 mg/0.1 mL SMARTSIG:Both Nares 02/22/21   [provider]  nitroGLYCERIN (NITROSTAT) 0.4 MG SL tablet Place 1 tablet (0.4 mg total) under the tongue every 5 (five) minutes as needed for chest pain. 02/06/19   Skeet Latch, MD  ondansetron (ZOFRAN) 4 MG tablet Take 4 mg by mouth 2 (two) times daily as needed for nausea/vomiting. 04/21/20   [provider]  oxyCODONE (ROXICODONE) 15 MG immediate release tablet Take 15 mg 3 (three) times daily as needed by mouth for pain.  08/28/17   [provider]  pantoprazole (PROTONIX) 40 MG tablet Take 40 mg by mouth daily. 04/13/21   [provider]  polyethylene glycol-electrolytes (NULYTELY) 420 g solution DISPENSE ONE FOUR LITER CONTAINER. USE AS DIRECTED 12/22/20   [provider]  potassium chloride (MICRO-K) 10 MEQ CR capsule Take 10 mEq by mouth in the morning and at bedtime. 04/03/20   [provider]  sertraline (ZOLOFT) 50 MG tablet Take 50 mg by  mouth daily. 12/17/20   [provider]  tiZANidine (ZANAFLEX) 4 MG tablet Take 4 mg by mouth at bedtime as needed for muscle spasms. 03/31/20   [provider]  traZODone (DESYREL) 50 MG tablet Take 1 tablet (50 mg total) by mouth at bedtime. 12/15/16   Florinda Marker, MD  Vitamin D, Ergocalciferol, (DRISDOL) 1.25 MG (50000 UT) CAPS capsule Take 50,000 Units by mouth every Monday. 08/15/19   [provider]    Allergies    Coconut flavor [flavoring agent], Coconut oil, Ibuprofen, Aleve [naproxen], Nsaids, and Vascepa [icosapent ethyl]  Review of Systems   Review of Systems  All other systems reviewed and are negative. Ten systems reviewed and are negative for acute change, except as noted in the HPI.   Physical Exam Updated Vital Signs BP (!) 146/72   Pulse 72   Temp 98.2 F (36.8 C) (Oral)   Resp 16   SpO2 94%   Physical Exam Vitals and nursing note reviewed.  Constitutional:      General: He is not in acute distress.    Appearance: Normal appearance. He is well-developed. He is obese. He is not ill-appearing, toxic-appearing or diaphoretic.     Interventions: He is not intubated. HENT:     Head: Normocephalic and atraumatic.     Right Ear: External ear normal.     Left Ear: External ear normal.     Nose: Nose normal.     Mouth/Throat:     Mouth: Mucous membranes are moist.     Pharynx: Oropharynx is clear. No oropharyngeal exudate or posterior oropharyngeal erythema.  Eyes:     Extraocular Movements: Extraocular  movements intact.  Cardiovascular:     Rate and Rhythm: Normal rate and regular rhythm.     Pulses: Normal pulses.     Heart sounds: Normal heart sounds. No murmur heard.   No friction rub. No gallop.  Pulmonary:     Effort: Pulmonary effort is normal. Tachypnea present. No bradypnea, accessory muscle usage or respiratory distress. He is not intubated.     Breath sounds: No stridor. No decreased breath sounds, wheezing, rhonchi or rales.  Abdominal:     General: Abdomen is flat.     Palpations: Abdomen is soft.     Tenderness: There is no abdominal tenderness.     Comments: Distended abdomen.  Abdomen is soft.  Musculoskeletal:        General: Normal range of motion.     Cervical back: Normal range of motion and neck supple. No tenderness.     Right lower leg: Edema present.     Left lower leg: Edema present.     Comments: Compression stockings in place.  2+ pitting edema noted in the bilateral lower extremities.  Skin:    General: Skin is warm and dry.  Neurological:     General: No focal deficit present.     Mental Status: He is alert and oriented to person, place, and time.  Psychiatric:        Mood and Affect: Mood normal.        Behavior: Behavior normal.   ED Results / Procedures / Treatments   Labs (all labs ordered are listed, but only abnormal results are displayed) Labs Reviewed  COMPREHENSIVE METABOLIC PANEL - Abnormal; Notable for the following components:      Result Value   Glucose, Bld 250 (*)    AST 60 (*)    ALT 46 (*)    All  other components within normal limits  URINALYSIS, ROUTINE W REFLEX MICROSCOPIC - Abnormal; Notable for the following components:   Color, Urine COLORLESS (*)    Glucose, UA 150 (*)    Hgb urine dipstick SMALL (*)    All other components within normal limits  CBC WITH DIFFERENTIAL/PLATELET - Abnormal; Notable for the following components:   RBC 3.76 (*)    Hemoglobin 11.9 (*)    HCT 35.4 (*)    All other components within normal  limits  RESP PANEL BY RT-PCR (FLU A&B, COVID) [id number]  BRAIN NATRIURETIC PEPTIDE  TROPONIN I (HIGH SENSITIVITY)  TROPONIN I (HIGH SENSITIVITY)   EKG EKG Interpretation  Date/Time:  Saturday June 05 2021 11:09:57 EDT Ventricular Rate:  76 PR Interval:  141 QRS Duration: 238 QT Interval:  395 QTC Calculation: 450 R Axis:   45 Text Interpretation: Unknown rhythm, irregular rate LVH with secondary repolarization abnormality Baseline wander in lead(s) V2, I, II which obscures interpretation of the ST segments specifically Axis normal No acute ischemia 12 Lead; [place] Confirmed by Lorre Munroe ([id number]) on 06/05/2021 12:41:57 PM  Radiology DG Chest 2 View  Result Date: 06/05/2021 CLINICAL DATA:  Chest pain, shortness of breath EXAM: CHEST - 2 VIEW COMPARISON:  06/04/2021 FINDINGS: Heart size is upper limits of normal. Mild pulmonary vascular congestion. No airspace consolidation or overt pulmonary edema. No pleural effusion or pneumothorax. Degenerative changes within the thoracic spine. IMPRESSION: Mild pulmonary vascular congestion without overt pulmonary edema. Electronically Signed   By: Davina Poke D.O.   On: 06/05/2021 12:48    Procedures Procedures   Medications Ordered in ED Medications  furosemide (LASIX) injection 40 mg (40 mg Intravenous Given 06/05/21 1250)  potassium chloride SA (KLOR-CON) CR tablet 40 mEq (40 mEq Oral Given 06/05/21 1249)    ED Course  I have reviewed the triage vital signs and the nursing notes.  Pertinent labs & imaging results that were available during my care of the patient were reviewed by me and considered in my medical decision making (see chart for details).  Clinical Course as of 06/05/21 1527  Sat Jun 05, 2021  1230 Initial labs mostly reassuring.  Troponin of 6.  Normal renal function.  We will give 40 mg of IV Lasix as well as some additional Klor-Con given his potassium is 3.5. [LJ]  [id number] Patient reassessed and states that he is  feeling much better.  States his shortness of breath has improved significantly.  We will obtain a second troponin and likely discharge. [LJ]    Clinical Course User Index [LJ] Rayna Sexton, PA-C   MDM Rules/Calculators/A&P                          Pt is a 61 y.o. male who presents to the emergency department due to worsening shortness of breath for the past 2 weeks.  Labs: CBC with a hemoglobin of 11.9. CMP with a glucose of 250, AST of 60, ALT of 46. UA with 150 glucose and small hemoglobin. BNP of 31.9. Troponin of 6 with a repeat of 6. Respiratory panel is negative.  Imaging: Chest x-ray shows mild pulmonary vascular congestion without overt pulmonary edema.  I, Rayna Sexton, PA-C, personally reviewed and evaluated these images and lab results as part of my medical decision-making.  Patient with what appears to be a CHF exacerbation today.  States he is taking his Lasix as prescribed but has been experiencing worsening shortness of breath  for the past 2 weeks.  Patient has 2+ pitting edema in the lower extremities.  Lungs were clear to auscultation bilaterally on my exam.  Chest x-ray does show some mild pulmonary vascular congestion.  Troponins and BNP are reassuring.  Respiratory panel is negative.  Patient given 40 mg of IV Lasix as well as Klor-Con.  Patient states that he has been producing urine and is starting to feel much better.  States that shortness of breath has improved significantly.  Patient has been able to stand and ambulate unassisted without difficulty.  Feel the patient is stable for discharge at this time and he is agreeable.  It is the weekend so I recommended that he come back to the emergency department if he develops any new or worsening symptoms.  He is planning on following up with cardiology on Monday.  His questions were answered and he was amicable at the time of discharge.  Note: Portions of this report may have been transcribed using voice  recognition software. Every effort was made to ensure accuracy; however, inadvertent computerized transcription errors may be present.   Final Clinical Impression(s) / ED Diagnoses Final diagnoses:  Shortness of breath  Acute on chronic congestive heart failure, unspecified heart failure type [facility])   Rx / DC Orders ED Discharge Orders     None        Rayna Sexton, PA-C 06/05/21 1533    Arnaldo Natal, MD 06/05/21 1558

## 2021-06-07 ENCOUNTER — Other Ambulatory Visit: Payer: Self-pay | Admitting: Gastroenterology

## 2021-06-14 ENCOUNTER — Other Ambulatory Visit (HOSPITAL_COMMUNITY): Payer: Self-pay | Admitting: Family Medicine

## 2021-06-14 DIAGNOSIS — R0602 Shortness of breath: Secondary | ICD-10-CM

## 2021-06-14 DIAGNOSIS — I2510 Atherosclerotic heart disease of native coronary artery without angina pectoris: Secondary | ICD-10-CM

## 2021-06-14 DIAGNOSIS — R609 Edema, unspecified: Secondary | ICD-10-CM

## 2021-06-15 ENCOUNTER — Emergency Department (HOSPITAL_COMMUNITY)
Admission: EM | Admit: 2021-06-15 | Discharge: 2021-06-15 | Disposition: A | Discharge status: Home / Self Care | Payer: Medicaid Other | Attending: Emergency Medicine | Admitting: Emergency Medicine

## 2021-06-15 ENCOUNTER — Emergency Department (HOSPITAL_COMMUNITY): Payer: Medicaid Other

## 2021-06-15 ENCOUNTER — Encounter (HOSPITAL_COMMUNITY): Payer: Self-pay | Admitting: Emergency Medicine

## 2021-06-15 DIAGNOSIS — E860 Dehydration: Secondary | ICD-10-CM

## 2021-06-15 DIAGNOSIS — U071 COVID-19: Secondary | ICD-10-CM | POA: Diagnosis not present

## 2021-06-15 DIAGNOSIS — I5032 Chronic diastolic (congestive) heart failure: Secondary | ICD-10-CM | POA: Insufficient documentation

## 2021-06-15 DIAGNOSIS — E1110 Type 2 diabetes mellitus with ketoacidosis without coma: Secondary | ICD-10-CM | POA: Diagnosis not present

## 2021-06-15 DIAGNOSIS — Z79899 Other long term (current) drug therapy: Secondary | ICD-10-CM | POA: Insufficient documentation

## 2021-06-15 DIAGNOSIS — Z955 Presence of coronary angioplasty implant and graft: Secondary | ICD-10-CM | POA: Diagnosis not present

## 2021-06-15 DIAGNOSIS — E1159 Type 2 diabetes mellitus with other circulatory complications: Secondary | ICD-10-CM | POA: Diagnosis not present

## 2021-06-15 DIAGNOSIS — I2510 Atherosclerotic heart disease of native coronary artery without angina pectoris: Secondary | ICD-10-CM | POA: Diagnosis not present

## 2021-06-15 DIAGNOSIS — Z7984 Long term (current) use of oral hypoglycemic drugs: Secondary | ICD-10-CM | POA: Diagnosis not present

## 2021-06-15 DIAGNOSIS — Z794 Long term (current) use of insulin: Secondary | ICD-10-CM | POA: Insufficient documentation

## 2021-06-15 DIAGNOSIS — Z7982 Long term (current) use of aspirin: Secondary | ICD-10-CM | POA: Insufficient documentation

## 2021-06-15 DIAGNOSIS — E1140 Type 2 diabetes mellitus with diabetic neuropathy, unspecified: Secondary | ICD-10-CM | POA: Insufficient documentation

## 2021-06-15 DIAGNOSIS — I110 Hypertensive heart disease with heart failure: Secondary | ICD-10-CM | POA: Insufficient documentation

## 2021-06-15 DIAGNOSIS — R0602 Shortness of breath: Secondary | ICD-10-CM | POA: Diagnosis present

## 2021-06-15 LAB — CBC WITH DIFFERENTIAL/PLATELET
Abs Immature Granulocytes: 0.03 10*3/uL (ref 0.00–0.07)
Basophils Absolute: 0 10*3/uL (ref 0.0–0.1)
Basophils Relative: 1 %
Eosinophils Absolute: 0.1 10*3/uL (ref 0.0–0.5)
Eosinophils Relative: 1 %
HCT: 43.2 % (ref 39.0–52.0)
Hemoglobin: 14.3 g/dL (ref 13.0–17.0)
Immature Granulocytes: 1 %
Lymphocytes Relative: 10 %
Lymphs Abs: 0.6 10*3/uL — ABNORMAL LOW (ref 0.7–4.0)
MCH: 31.6 pg (ref 26.0–34.0)
MCHC: 33.1 g/dL (ref 30.0–36.0)
MCV: 95.4 fL (ref 80.0–100.0)
Monocytes Absolute: 0.8 10*3/uL (ref 0.1–1.0)
Monocytes Relative: 13 %
Neutro Abs: 5 10*3/uL (ref 1.7–7.7)
Neutrophils Relative %: 74 %
Platelets: 163 10*3/uL (ref 150–400)
RBC: 4.53 MIL/uL (ref 4.22–5.81)
RDW: 13.3 % (ref 11.5–15.5)
WBC: 6.5 10*3/uL (ref 4.0–10.5)
nRBC: 0 % (ref 0.0–0.2)

## 2021-06-15 LAB — COMPREHENSIVE METABOLIC PANEL
ALT: 28 U/L (ref 0–44)
AST: 43 U/L — ABNORMAL HIGH (ref 15–41)
Albumin: 4.1 g/dL (ref 3.5–5.0)
Alkaline Phosphatase: 45 U/L (ref 38–126)
Anion gap: 13 (ref 5–15)
BUN: 13 mg/dL (ref 6–20)
CO2: 27 mmol/L (ref 22–32)
Calcium: 9.5 mg/dL (ref 8.9–10.3)
Chloride: 98 mmol/L (ref 98–111)
Creatinine, Ser: 0.96 mg/dL (ref 0.61–1.24)
GFR, Estimated: >60 mL/min (ref >60)
Glucose, Bld: 211 mg/dL — ABNORMAL HIGH (ref 70–99)
Potassium: 3.4 mmol/L — ABNORMAL LOW (ref 3.5–5.1)
Sodium: 138 mmol/L (ref 135–145)
Total Bilirubin: 0.7 mg/dL (ref 0.3–1.2)
Total Protein: 7.7 g/dL (ref 6.5–8.1)

## 2021-06-15 LAB — LACTIC ACID, PLASMA
Lactic Acid, Venous: 3.7 mmol/L (ref 0.5–1.9)
Lactic Acid, Venous: 2.8 mmol/L (ref 0.5–1.9)

## 2021-06-15 LAB — TROPONIN I (HIGH SENSITIVITY)
Troponin I (High Sensitivity): 10 ng/L (ref <18)
Troponin I (High Sensitivity): 10 ng/L (ref <18)

## 2021-06-15 LAB — RESP PANEL BY RT-PCR (FLU A&B, COVID) ARPGX2
Influenza A by PCR: NEGATIVE
Influenza B by PCR: NEGATIVE
SARS Coronavirus 2 by RT PCR: POSITIVE — AB

## 2021-06-15 LAB — BRAIN NATRIURETIC PEPTIDE: B Natriuretic Peptide: 13.3 pg/mL (ref 0.0–100.0)

## 2021-06-15 MED ORDER — LISINOPRIL 10 MG PO TABS
5.0000 mg | ORAL_TABLET | Freq: Once | ORAL | Status: AC
  Administered 2021-06-15: 5 mg via ORAL
  Filled 2021-06-15: qty 1

## 2021-06-15 MED ORDER — CARVEDILOL 12.5 MG PO TABS
12.5000 mg | ORAL_TABLET | Freq: Two times a day (BID) | ORAL | Status: DC
  Administered 2021-06-15: 12.5 mg via ORAL
  Filled 2021-06-15 (×2): qty 1

## 2021-06-15 MED ORDER — SODIUM CHLORIDE 0.9 % IV BOLUS
1000.0000 mL | Freq: Once | INTRAVENOUS | Status: AC
Start: 2021-06-15 — End: 2021-06-15
  Administered 2021-06-15: 1000 mL via INTRAVENOUS

## 2021-06-15 MED ORDER — NIRMATRELVIR/RITONAVIR (PAXLOVID)TABLET
3.0000 | ORAL_TABLET | Freq: Two times a day (BID) | ORAL | Status: DC
  Administered 2021-06-15: 3 via ORAL
  Filled 2021-06-15: qty 30

## 2021-06-15 MED ORDER — FUROSEMIDE 20 MG PO TABS
40.0000 mg | ORAL_TABLET | Freq: Once | ORAL | Status: AC
  Administered 2021-06-15: 40 mg via ORAL
  Filled 2021-06-15: qty 2

## 2021-06-15 MED ORDER — ACETAMINOPHEN 325 MG PO TABS
650.0000 mg | ORAL_TABLET | Freq: Once | ORAL | Status: AC
  Administered 2021-06-15: 650 mg via ORAL
  Filled 2021-06-15: qty 2

## 2021-06-15 NOTE — ED Triage Notes (Signed)
Pt here from home with c/o cp and sob , hx of chf , pt was at [facility] 10days ago for same , pt was placed on 2 liters O2 n/c by ems , pt not normally on O2

## 2021-06-15 NOTE — ED Notes (Addendum)
Unable to obtain peripheral access.

## 2021-06-15 NOTE — Discharge Instructions (Addendum)
You have tested POSITIVE for COVID 19 today. It is recommended that you self isolate for 7 days from symptom onset (cleared: 08/29)  Please take the Paxlovid as prescribed. It is recommended that you hold your Atorvastatin while you are on this medication as there is a contraindication to take them together. You can re-start this medication once you are finished with the Paxlovid  Drink plenty of water to stay hydrated as you were very dehydrated today. Rest as much as you can and take Tylenol as needed for fevers > 100.4   Follow up with your PCP regarding ED visit today  Return to the ED IMMEDIATELY for any new/worsening symptoms

## 2021-06-15 NOTE — ED Provider Notes (Signed)
Emergency Medicine Provider Triage Evaluation Note  [name] , a 60 y.o. male  was evaluated in triage.  Pt complains of productive cough and SOB for the past 2 days. He also complains of body aches and subjective fevers. Per EMS 100.3 F. He is vaccinated and boosted x 1. He was placed on 2L [place] per EMS for comfort. He was seen at [facility] on 08/13 for CHF exacerbation - has been compliant with his Lasix since that time and states this feels different.   Review of Systems  Positive: + chest pain, SOB, productive cough, body aches Negative: - chills, diarrhea  Physical Exam  BP (!) 159/81   Pulse 93   Temp 100.1 F (37.8 C) (Oral)   Resp 18   SpO2 100%  Gen:   Awake, no distress   Resp:  Normal effort. Speaking in full sentences. LCTAB. Actively coughing MSK:   Moves extremities without difficulty  Other:    Medical Decision Making  Medically screening exam initiated at 10:41 AM.  Appropriate orders placed.  LEELEND FORKER was informed that the remainder of the evaluation will be completed by another provider, this initial triage assessment does not replace that evaluation, and the importance of remaining in the ED until their evaluation is complete.  Febrile with cough. Concern for PNA vs viral infection (possibly COVID). Recent CHF exacerbation 10 days ago.    Eustaquio Maize, PA-C 06/15/21 1044    Malvin Johns, MD 06/15/21 1054

## 2021-06-15 NOTE — ED Notes (Signed)
Patient given discharge instructions, all questions answered. Patient in possession of all belongings, directed to the discharge area  

## 2021-06-15 NOTE — ED Notes (Signed)
Patient provided with snack bag

## 2021-06-15 NOTE — ED Provider Notes (Signed)
[facility] EMERGENCY DEPARTMENT Provider Note   CSN: [id number] Arrival date & time: 06/15/21  1005     History No chief complaint on file.   [name] is a 60 y.o. male with PMHx HTN, HLD, DVT/PE (not currently anticoagulated), CHF with EF 55-60%, Diabetes who presents to the ED today via EMS with complaint of shortness of breath for the past 2 days. Pt also complains of a productive cough, fatigue, body aches. He reports subjective fevers however did not check his temperature at home - temp 100.3 with EMS. He denies any recent sick contacts. He is vaccinated and boosted x 1. He was seen in the ED on 08/13 for similar complaints and treated for a CHF exacerbation at that time. Pt reports compliance with his lasix however did not take any of his medications this morning due to worsening shortness of breath. He states he has some chest tightness as well. He cannot recall if this feels similar to previous DVT/PE and is unsure how many years it has been since he was diagnosed with same. Currently taking a baby aspirin.   The history is provided by the patient and medical records.      Past Medical History:  Diagnosis Date   Anemia    Arthritis    Back pain    CAD (coronary artery disease)    a. s/p DES to LAD in 05/2016   Cervical radiculopathy    Chronic diastolic CHF (congestive heart failure) (HCC)    Chronic pain    Depression    DVT (deep venous thrombosis) (HCC)    Hematemesis    Hepatic steatosis    Hyperlipidemia    Hypertension    IBS (irritable bowel syndrome)    Morbid obesity (HCC)    OSA (obstructive sleep apnea)    Pancreatitis    PE (pulmonary thromboembolism) ([place])    Peripheral neuropathy    PUD (peptic ulcer disease)    Renal disorder    Stroke [facility])    a. ?details unclear - not seen on imaging when he was admitted in 05/2017 for TIA symptoms which were felt due to cervical radiculopathy.   Thoracic aortic ectasia (HCC)    a. 4.3cm  ectatic ascending thoracic aorta by CT 06/2017.    Type 2 diabetes mellitus [facility])     Patient Active Problem List   Diagnosis Date Noted   Lumbar radiculitis 03/30/2021   Morbid obesity ([place]) 01/15/2021   Personal history of colonic polyps 01/15/2021   Rectal bleeding 01/15/2021   Pain due to onychomycosis of toenails of both feet 01/15/2021   Epistaxis, recurrent 10/21/2020   Laceration of nose 10/21/2020   Syncope 10/15/2020   OSA (obstructive sleep apnea)    Bilateral carotid artery stenosis 07/28/2020   Bilateral lower extremity edema 07/28/2020   CHF (congestive heart failure) ([place]) 07/28/2020   DDD (degenerative disc disease), lumbar 07/28/2020   Diabetic peripheral neuropathy ([place]) 07/28/2020   Fatty liver 07/28/2020   Irritable bowel syndrome with diarrhea 07/28/2020   Osteoarthritis of right hip 07/28/2020   Upper GI bleed 04/28/2020   Hypertensive urgency 04/27/2020   Acute GI bleeding 04/27/2020   Mixed diabetic hyperlipidemia associated with type 2 diabetes mellitus ([place]) 04/27/2020   Rotator cuff arthropathy 10/30/2019   Cervical radiculopathy 09/24/2019   Trochanteric bursitis of right hip 09/24/2019   Chronic diastolic CHF (congestive heart failure) ([place]) 08/23/2019   ARF (acute renal failure) ([place]) 08/01/2019   Hyperkalemia 08/01/2019  Uncontrolled type 2 diabetes mellitus with hyperglycemia ([place]) 08/01/2019   AKI (acute kidney injury) ([place])    Hypotension 07/31/2019   DKA (diabetic ketoacidosis) ([place]) 05/05/2019   Blurred vision 05/05/2019   Trochanteric bursitis 02/11/2019   Long-term insulin use ([place]) 01/25/2019   Alteration consciousness 11/19/2018   Neck pain 10/09/2018   Left arm weakness 10/09/2018   B12 deficiency 08/10/2017   Persistent headaches 08/09/2017   GERD (gastroesophageal reflux disease) 06/29/2017   Left arm numbness    Cerebral embolism with cerebral infarction 06/17/2017   TIA (transient ischemic attack) 06/17/2017   Chronic back  pain 12/29/2016   Depression 12/15/2016   Vitamin D deficiency 12/09/2016   Right hip pain 12/07/2016   Hypokalemia 12/07/2016   Type 2 diabetes mellitus with vascular disease ([place]) 05/31/2016   Normocytic normochromic anemia 05/31/2016   Chest pain 05/31/2016   Coronary artery disease involving native coronary artery of native heart without angina pectoris 01/06/2016   DVT (deep venous thrombosis) ([place]) 01/06/2016   Lactic acidosis 05/28/2014   Nonspecific chest pain 01/29/2014   Uncontrolled secondary diabetes with peripheral neuropathy ([place]) 01/29/2014   Obesity, Class III, BMI 40-49.9 (morbid obesity) ([place]) 01/29/2014   Snoring 01/29/2014   Dyslipidemia 01/29/2014   HTN (hypertension) 01/29/2014   Abnormal nuclear stress test 01/29/2014    Past Surgical History:  Procedure Laterality Date   CARDIAC CATHETERIZATION N/A 05/31/2016   Procedure: Left Heart Cath and Coronary Angiography;  Surgeon: Peter M Martinique, MD;  Location: [place] CV LAB;  Service: Cardiovascular;  Laterality: N/A;   CARDIAC CATHETERIZATION N/A 05/31/2016   Procedure: Intravascular Pressure Wire/FFR Study;  Surgeon: Peter M Martinique, MD;  Location: [place] CV LAB;  Service: Cardiovascular;  Laterality: N/A;   CARDIAC CATHETERIZATION N/A 05/31/2016   Procedure: Coronary Stent Intervention;  Surgeon: Peter M Martinique, MD;  Location: [place] CV LAB;  Service: Cardiovascular;  Laterality: N/A;   COLONOSCOPY WITH PROPOFOL N/A 04/29/2020   Procedure: COLONOSCOPY WITH PROPOFOL;  Surgeon: Wilford Corner, MD;  Location: [place];  Service: Endoscopy;  Laterality: N/A;   ESOPHAGOGASTRODUODENOSCOPY N/A 04/29/2020   Procedure: ESOPHAGOGASTRODUODENOSCOPY (EGD);  Surgeon: Wilford Corner, MD;  Location: [place];  Service: Endoscopy;  Laterality: N/A;   LEFT HEART CATH AND CORONARY ANGIOGRAPHY N/A 12/08/2017   Procedure: LEFT HEART CATH AND CORONARY ANGIOGRAPHY;  Surgeon: Leonie Man, MD;  Location: [place]  CV LAB;  Service: Cardiovascular;  Laterality: N/A;   LEFT HEART CATHETERIZATION WITH CORONARY ANGIOGRAM N/A 02/03/2014   Procedure: LEFT HEART CATHETERIZATION WITH CORONARY ANGIOGRAM;  Surgeon: Pixie Casino, MD;  Location: [facility] CATH LAB;  Service: Cardiovascular;  Laterality: N/A;   left leg stent      POLYPECTOMY  04/29/2020   Procedure: POLYPECTOMY;  Surgeon: Wilford Corner, MD;  Location: [facility] ENDOSCOPY;  Service: Endoscopy;;       Family History  Problem Relation Age of Onset   Cancer Father    Hypertension Mother    Diabetes Mother    Breast cancer Mother    Hypertension Brother    Diabetes Brother    Hypertension Sister    Diabetes Sister     Social History   Tobacco Use   Smoking status: Never   Smokeless tobacco: Never  Vaping Use   Vaping Use: Never used  Substance Use Topics   Alcohol use: No   Drug use: No    Home Medications Prior to Admission medications   Medication Sig Start Date End Date Taking? Authorizing  Provider  albuterol (VENTOLIN HFA) 108 (90 Base) MCG/ACT inhaler Inhale 2 puffs into the lungs every 6 (six) hours as needed for wheezing or shortness of breath. As Directed   Yes [provider]  aspirin 81 MG chewable tablet Chew 1 tablet (81 mg total) by mouth daily. 06/01/16  Yes Elwin Mocha, MD  atorvastatin (LIPITOR) 40 MG tablet Take 40 mg by mouth at bedtime.   Yes [provider]  carvedilol (COREG) 12.5 MG tablet Take 12.5 mg by mouth 2 (two) times daily. 10/22/20  Yes [provider]  diclofenac (FLECTOR) 1.3 % PTCH Place 1 patch onto the skin daily as needed for pain. 03/02/20  Yes [provider]  famotidine (PEPCID) 20 MG tablet Take 20 mg by mouth daily. 03/05/20  Yes [provider]  fenofibrate (TRICOR) 145 MG tablet Take 1 tablet (145 mg total) by mouth daily. 06/18/20  Yes Hilty, Nadean Corwin, MD  furosemide (LASIX) 40 MG tablet Take 1 tablet (40 mg total) by mouth daily. PLEASE RESUME ONLY  FROM 10/21/2020. DOSE HAS BEEN CHANGED. Patient taking differently: Take 40 mg by mouth 3 (three) times daily. 10/21/20 10/21/21 Yes Bonnielee Haff, MD  gabapentin (NEURONTIN) 400 MG capsule Take 400 mg by mouth 3 (three) times daily. 11/24/20  Yes [provider]  glipiZIDE (GLUCOTROL) 10 MG tablet Take 10 mg by mouth 2 (two) times daily. 05/30/17  Yes [provider]  HUMULIN R U-500 KWIKPEN 500 UNIT/ML kwikpen Inject 70-90 Units into the skin See admin instructions. Inject under the skin three times daily as directed or sliding scale: 06/04/20  Yes [provider]  isosorbide mononitrate (IMDUR) 30 MG 24 hr tablet TAKE 1 TABLET BY MOUTH DAILY Patient taking differently: Take 30 mg by mouth daily. 12/24/20  Yes Hilty, Nadean Corwin, MD  lisinopril (ZESTRIL) 5 MG tablet Take 5 mg by mouth daily. 12/04/20  Yes [provider]  naloxone [facility]) nasal spray 4 mg/0.1 mL SMARTSIG:Both Nares 02/22/21  Yes [provider]  nitroGLYCERIN (NITROSTAT) 0.4 MG SL tablet Place 1 tablet (0.4 mg total) under the tongue every 5 (five) minutes as needed for chest pain. 02/06/19  Yes Skeet Latch, MD  ondansetron (ZOFRAN) 4 MG tablet Take 4 mg by mouth 2 (two) times daily as needed for nausea/vomiting. 04/21/20  Yes [provider]  Oxycodone HCl 20 MG TABS Take 1 tablet by mouth 4 (four) times daily as needed for pain. LF on 05-31-21 # 120 05/31/21  Yes [provider]  pantoprazole (PROTONIX) 40 MG tablet Take 40 mg by mouth daily. 04/13/21  Yes [provider]  potassium chloride (MICRO-K) 10 MEQ CR capsule Take 20 mEq by mouth in the morning and at bedtime. 04/03/20  Yes [provider]  sertraline (ZOLOFT) 50 MG tablet Take 50 mg by mouth daily. 12/17/20  Yes [provider]  tiZANidine (ZANAFLEX) 4 MG tablet Take 4 mg by mouth at bedtime as needed for muscle spasms. 03/31/20  Yes [provider]  traZODone (DESYREL) 50 MG tablet  Take 1 tablet (50 mg total) by mouth at bedtime. 12/15/16  Yes Burns, Arloa Koh, MD  Vitamin D, Ergocalciferol, (DRISDOL) 1.25 MG (50000 UT) CAPS capsule Take 50,000 Units by mouth every Monday. 08/15/19  Yes [provider]  Accu-Chek FastClix Lancets MISC 1 Device by Other route 2 (two) times daily.  03/20/19   [provider]  ACCU-CHEK GUIDE test strip  01/08/21   [provider]  Insulin  Pen Needle 31G X 5 MM MISC Use 1 needle daily to inject insulin as prescribed Patient taking differently: 1 each. Use 1 needle daily to inject insulin as prescribed 06/18/17   Barton Dubois, MD    Allergies    Coconut flavor [flavoring agent], Coconut oil, Ibuprofen, Aleve [naproxen], Nsaids, and Vascepa [icosapent ethyl]  Review of Systems   Review of Systems  Constitutional:  Positive for fatigue and fever. Negative for chills.  Respiratory:  Positive for cough, chest tightness and shortness of breath.   Cardiovascular:  Negative for palpitations and leg swelling.  Gastrointestinal:  Negative for nausea and vomiting.  All other systems reviewed and are negative.  Physical Exam Updated Vital Signs BP (!) 153/68   Pulse 91   Temp (!) 100.9 F (38.3 C) (Oral)   Resp 16   SpO2 95%   Physical Exam Vitals and nursing note reviewed.  Constitutional:      Appearance: He is obese. He is not ill-appearing or diaphoretic.  HENT:     Head: Normocephalic and atraumatic.  Eyes:     Conjunctiva/sclera: Conjunctivae normal.  Cardiovascular:     Rate and Rhythm: Normal rate and regular rhythm.     Pulses: Normal pulses.  Pulmonary:     Effort: Pulmonary effort is normal.     Breath sounds: Normal breath sounds. No wheezing, rhonchi or rales.     Comments: Currently on 2L for comfort. Satting 100% on 2L and 95% on RA. LCTAB.  Abdominal:     Palpations: Abdomen is soft.     Tenderness: There is no abdominal tenderness. There is no guarding or rebound.  Musculoskeletal:      Cervical back: Neck supple.  Skin:    General: Skin is warm and dry.  Neurological:     Mental Status: He is alert.    ED Results / Procedures / Treatments   Labs (all labs ordered are listed, but only abnormal results are displayed) Labs Reviewed  RESP PANEL BY RT-PCR (FLU A&B, COVID) ARPGX2 - Abnormal; Notable for the following components:      Result Value   SARS Coronavirus 2 by RT PCR POSITIVE (*)    All other components within normal limits  COMPREHENSIVE METABOLIC PANEL - Abnormal; Notable for the following components:   Potassium 3.4 (*)    Glucose, Bld 211 (*)    AST 43 (*)    All other components within normal limits  CBC WITH DIFFERENTIAL/PLATELET - Abnormal; Notable for the following components:   Lymphs Abs 0.6 (*)    All other components within normal limits  LACTIC ACID, PLASMA - Abnormal; Notable for the following components:   Lactic Acid, Venous 3.7 (*)    All other components within normal limits  LACTIC ACID, PLASMA - Abnormal; Notable for the following components:   Lactic Acid, Venous 2.8 (*)    All other components within normal limits  BRAIN NATRIURETIC PEPTIDE  TROPONIN I (HIGH SENSITIVITY)  TROPONIN I (HIGH SENSITIVITY)    EKG None  Radiology DG Chest 2 View  Result Date: 06/15/2021 CLINICAL DATA:  Shortness of breath, cough, fever EXAM: CHEST - 2 VIEW COMPARISON:  Chest radiograph 06/05/2021 FINDINGS: The heart is enlarged, unchanged. The mediastinal contours are stable. There is vascular congestion without frank interstitial edema. There is no focal consolidation. There is no pleural effusion. There is no pneumothorax. There is no acute osseous abnormality. IMPRESSION: 1. Cardiomegaly with vascular congestion but no overt pulmonary edema, overall unchanged. 2. No  new focal airspace disease. Electronically Signed   By: Valetta Mole M.D.   On: 06/15/2021 11:48    Procedures Procedures   Medications Ordered in ED Medications  carvedilol (COREG)  tablet 12.5 mg (12.5 mg Oral Given 06/15/21 1451)  nirmatrelvir/ritonavir EUA (PAXLOVID) 3 tablet (3 tablets Oral Given 06/15/21 1635)  acetaminophen (TYLENOL) tablet 650 mg (650 mg Oral Given 06/15/21 1218)  sodium chloride 0.9 % bolus 1,000 mL (0 mLs Intravenous Stopped 06/15/21 1428)  lisinopril (ZESTRIL) tablet 5 mg (5 mg Oral Given 06/15/21 1315)  furosemide (LASIX) tablet 40 mg (40 mg Oral Given 06/15/21 1315)    ED Course  I have reviewed the triage vital signs and the nursing notes.  Pertinent labs & imaging results that were available during my care of the patient were reviewed by me and considered in my medical decision making (see chart for details).  Clinical Course as of 06/15/21 1647  Tue Jun 15, 2021  1332 SARS Coronavirus 2 by RT PCR(!): POSITIVE [MV]    Clinical Course User Index [MV] Eustaquio Maize, PA-C   MDM Rules/Calculators/A&P                           60 year old male who presents to the ED today with complaint of productive cough, shortness of breath, body aches, fatigue, chest tightness.  On arrival to the ED he was found to be febrile at 100.1.  Nontachycardic, nontachypneic.  Placed on 2 L for comfort with EMS however satting 95% on room air without oxygen.  Is not oxygen dependent at home.  Was recently in the ED on 08/13 for shortness of breath and treated for CHF exacerbation.  His chest x-ray did look like he had some fluid overload however his BNP was within normal limits.  We will plan for work-up at this time including CBC, CMP, troponin, BNP, COVID test, chest x-ray.   Chest x-ray with cardiomegaly and vascular congestion but no overt pulmonary edema and overall unchanged compared to previous.  His BNP is also within normal limits at 13.3.  CBC without leukocytosis at 6.5, hemoglobin stable at 14.3.  CMP with a potassium of 3.4.  Glucose 211, bicarb within normal limits and no gap. Lactic acid has returned elevated at 3.7.  I do suspect this is likely  secondary to dehydration.  We will plan for IV fluids.   Patient's blood pressure initially 159/81 with repeat at 203/76.  He reports he did not take his blood pressure medicine this morning nor his Lasix.  Will provide doses of each with reevaluation.   COVID test has returned positive at this time. Will plan to ambulate pt to assess for desaturation however I do suspect he will  be able to go home. Will start on Paxlovid in the ED today - pharmacy team recommends holding his atorvastatin at home until he has finished the paxlovid.   Repeat lactic acid 2.8; decreasing after fluids. Do not want to overload pt given hx of CHF today. Will encourage increased oral intake at home.   Pt has received Paxlovid in the ED for 5 days. His blood pressure stable with recent BP reading at 153/68. Stable for discharge at this time with instructions to self isolate and PCP follow up.   This note was prepared using Dragon voice recognition software and may include unintentional dictation errors due to the inherent limitations of voice recognition software.  [name] was evaluated in Emergency  Department on 06/15/2021 for the symptoms described in the history of present illness. He was evaluated in the context of the global COVID-19 pandemic, which necessitated consideration that the patient might be at risk for infection with the SARS-CoV-2 virus that causes COVID-19. Institutional protocols and algorithms that pertain to the evaluation of patients at risk for COVID-19 are in a state of rapid change based on information released by regulatory bodies including the CDC and federal and state organizations. These policies and algorithms were followed during the patient's care in the ED.   Final Clinical Impression(s) / ED Diagnoses Final diagnoses:  COVID-19  Dehydration    Rx / DC Orders ED Discharge Orders     None        Discharge Instructions      You have tested POSITIVE for COVID 19 today.  It is recommended that you self isolate for 7 days from symptom onset (cleared: 08/29)  Please take the Paxlovid as prescribed. It is recommended that you hold your Atorvastatin while you are on this medication as there is a contraindication to take them together. You can re-start this medication once you are finished with the Paxlovid  Drink plenty of water to stay hydrated as you were very dehydrated today. Rest as much as you can and take Tylenol as needed for fevers > 100.4   Follow up with your PCP regarding ED visit today  Return to the ED IMMEDIATELY for any new/worsening symptoms       Eustaquio Maize, PA-C 06/15/21 1648    Malvin Johns, MD 06/26/21 [phone]

## 2021-06-15 NOTE — ED Notes (Signed)
Continue to await medication from central pharmacy

## 2021-06-24 ENCOUNTER — Other Ambulatory Visit: Payer: Self-pay | Admitting: Internal Medicine

## 2021-06-25 ENCOUNTER — Telehealth: Payer: Self-pay | Admitting: Internal Medicine

## 2021-06-25 MED ORDER — FENOFIBRATE 145 MG PO TABS: 145.0000 mg | ORAL_TABLET | Freq: Every day | ORAL | 2 refills | Status: DC

## 2021-06-25 NOTE — Telephone Encounter (Signed)
  *  STAT* If patient is at the pharmacy, call can be transferred to refill team.   1. Which medications need to be refilled? (please list name of each medication and dose if known) fenofibrate (TRICOR) 145 MG tablet  2. Which pharmacy/location (including street and city if local pharmacy) is medication to be sent to? [place], [place]  3. Do they need a 30 day or 90 day supply? 90 days  Per pharmacy they need refill today so they can include this meds to send to the pt

## 2021-06-25 NOTE — Telephone Encounter (Signed)
Refills has been sent to [facility].

## 2021-06-26 ENCOUNTER — Emergency Department (HOSPITAL_COMMUNITY): Payer: Medicaid Other

## 2021-06-26 ENCOUNTER — Other Ambulatory Visit: Payer: Self-pay

## 2021-06-26 ENCOUNTER — Encounter (HOSPITAL_COMMUNITY): Payer: Self-pay | Admitting: Emergency Medicine

## 2021-06-26 ENCOUNTER — Ambulatory Visit (HOSPITAL_COMMUNITY)
Admission: EM | Admit: 2021-06-26 | Discharge: 2021-06-26 | Disposition: A | Discharge status: Home / Self Care | Payer: Medicaid Other | Attending: Internal Medicine | Admitting: Internal Medicine

## 2021-06-26 ENCOUNTER — Inpatient Hospital Stay (HOSPITAL_COMMUNITY)
Admission: EM | Admit: 2021-06-26 | Discharge: 2021-06-29 | DRG: 175 | Disposition: A | Discharge status: Home / Self Care | Payer: Medicaid Other | Attending: Internal Medicine | Admitting: Internal Medicine

## 2021-06-26 DIAGNOSIS — Z79899 Other long term (current) drug therapy: Secondary | ICD-10-CM | POA: Diagnosis not present

## 2021-06-26 DIAGNOSIS — J9601 Acute respiratory failure with hypoxia: Secondary | ICD-10-CM | POA: Diagnosis present

## 2021-06-26 DIAGNOSIS — I10 Essential (primary) hypertension: Secondary | ICD-10-CM | POA: Diagnosis present

## 2021-06-26 DIAGNOSIS — G8929 Other chronic pain: Secondary | ICD-10-CM | POA: Diagnosis present

## 2021-06-26 DIAGNOSIS — U071 COVID-19: Secondary | ICD-10-CM | POA: Diagnosis present

## 2021-06-26 DIAGNOSIS — K589 Irritable bowel syndrome without diarrhea: Secondary | ICD-10-CM | POA: Diagnosis present

## 2021-06-26 DIAGNOSIS — K219 Gastro-esophageal reflux disease without esophagitis: Secondary | ICD-10-CM | POA: Diagnosis present

## 2021-06-26 DIAGNOSIS — Z86718 Personal history of other venous thrombosis and embolism: Secondary | ICD-10-CM | POA: Diagnosis not present

## 2021-06-26 DIAGNOSIS — I2699 Other pulmonary embolism without acute cor pulmonale: Secondary | ICD-10-CM | POA: Diagnosis present

## 2021-06-26 DIAGNOSIS — Z833 Family history of diabetes mellitus: Secondary | ICD-10-CM | POA: Diagnosis not present

## 2021-06-26 DIAGNOSIS — Z8673 Personal history of transient ischemic attack (TIA), and cerebral infarction without residual deficits: Secondary | ICD-10-CM

## 2021-06-26 DIAGNOSIS — M5412 Radiculopathy, cervical region: Secondary | ICD-10-CM | POA: Diagnosis present

## 2021-06-26 DIAGNOSIS — I82409 Acute embolism and thrombosis of unspecified deep veins of unspecified lower extremity: Secondary | ICD-10-CM | POA: Diagnosis present

## 2021-06-26 DIAGNOSIS — I82403 Acute embolism and thrombosis of unspecified deep veins of lower extremity, bilateral: Secondary | ICD-10-CM | POA: Diagnosis present

## 2021-06-26 DIAGNOSIS — L97909 Non-pressure chronic ulcer of unspecified part of unspecified lower leg with unspecified severity: Secondary | ICD-10-CM | POA: Diagnosis not present

## 2021-06-26 DIAGNOSIS — Z794 Long term (current) use of insulin: Secondary | ICD-10-CM | POA: Diagnosis not present

## 2021-06-26 DIAGNOSIS — I110 Hypertensive heart disease with heart failure: Secondary | ICD-10-CM | POA: Diagnosis present

## 2021-06-26 DIAGNOSIS — Z7982 Long term (current) use of aspirin: Secondary | ICD-10-CM | POA: Diagnosis not present

## 2021-06-26 DIAGNOSIS — E1159 Type 2 diabetes mellitus with other circulatory complications: Secondary | ICD-10-CM | POA: Diagnosis not present

## 2021-06-26 DIAGNOSIS — I2609 Other pulmonary embolism with acute cor pulmonale: Secondary | ICD-10-CM | POA: Diagnosis not present

## 2021-06-26 DIAGNOSIS — G4733 Obstructive sleep apnea (adult) (pediatric): Secondary | ICD-10-CM | POA: Diagnosis present

## 2021-06-26 DIAGNOSIS — I5032 Chronic diastolic (congestive) heart failure: Secondary | ICD-10-CM | POA: Diagnosis present

## 2021-06-26 DIAGNOSIS — I2510 Atherosclerotic heart disease of native coronary artery without angina pectoris: Secondary | ICD-10-CM | POA: Diagnosis present

## 2021-06-26 DIAGNOSIS — Z8711 Personal history of peptic ulcer disease: Secondary | ICD-10-CM | POA: Diagnosis not present

## 2021-06-26 DIAGNOSIS — Z888 Allergy status to other drugs, medicaments and biological substances status: Secondary | ICD-10-CM

## 2021-06-26 DIAGNOSIS — Z86711 Personal history of pulmonary embolism: Secondary | ICD-10-CM

## 2021-06-26 DIAGNOSIS — E876 Hypokalemia: Secondary | ICD-10-CM | POA: Diagnosis not present

## 2021-06-26 DIAGNOSIS — Z91048 Other nonmedicinal substance allergy status: Secondary | ICD-10-CM

## 2021-06-26 DIAGNOSIS — Z6841 Body Mass Index (BMI) 40.0 and over, adult: Secondary | ICD-10-CM

## 2021-06-26 DIAGNOSIS — R0602 Shortness of breath: Secondary | ICD-10-CM | POA: Diagnosis not present

## 2021-06-26 DIAGNOSIS — Z886 Allergy status to analgesic agent status: Secondary | ICD-10-CM

## 2021-06-26 DIAGNOSIS — E11622 Type 2 diabetes mellitus with other skin ulcer: Secondary | ICD-10-CM | POA: Diagnosis present

## 2021-06-26 DIAGNOSIS — F32A Depression, unspecified: Secondary | ICD-10-CM | POA: Diagnosis present

## 2021-06-26 DIAGNOSIS — E785 Hyperlipidemia, unspecified: Secondary | ICD-10-CM | POA: Diagnosis present

## 2021-06-26 DIAGNOSIS — Z8249 Family history of ischemic heart disease and other diseases of the circulatory system: Secondary | ICD-10-CM

## 2021-06-26 LAB — BASIC METABOLIC PANEL
Anion gap: 12 (ref 5–15)
BUN: 13 mg/dL (ref 6–20)
CO2: 28 mmol/L (ref 22–32)
Calcium: 9.3 mg/dL (ref 8.9–10.3)
Chloride: 98 mmol/L (ref 98–111)
Creatinine, Ser: 0.84 mg/dL (ref 0.61–1.24)
GFR, Estimated: >60 mL/min (ref >60)
Glucose, Bld: 227 mg/dL — ABNORMAL HIGH (ref 70–99)
Potassium: 3.7 mmol/L (ref 3.5–5.1)
Sodium: 138 mmol/L (ref 135–145)

## 2021-06-26 LAB — CBC
HCT: 39.1 % (ref 39.0–52.0)
Hemoglobin: 12.9 g/dL — ABNORMAL LOW (ref 13.0–17.0)
MCH: 31.4 pg (ref 26.0–34.0)
MCHC: 33 g/dL (ref 30.0–36.0)
MCV: 95.1 fL (ref 80.0–100.0)
Platelets: 209 10*3/uL (ref 150–400)
RBC: 4.11 MIL/uL — ABNORMAL LOW (ref 4.22–5.81)
RDW: 13.4 % (ref 11.5–15.5)
WBC: 10.5 10*3/uL (ref 4.0–10.5)
nRBC: 0 % (ref 0.0–0.2)

## 2021-06-26 LAB — TROPONIN I (HIGH SENSITIVITY)
Troponin I (High Sensitivity): 167 ng/L (ref <18)
Troponin I (High Sensitivity): 165 ng/L (ref <18)

## 2021-06-26 LAB — PROTIME-INR
INR: 1.2 (ref 0.8–1.2)
Prothrombin Time: 14.8 seconds (ref 11.4–15.2)

## 2021-06-26 LAB — CBG MONITORING, ED
Glucose-Capillary: 251 mg/dL — ABNORMAL HIGH (ref 70–99)
Glucose-Capillary: 262 mg/dL — ABNORMAL HIGH (ref 70–99)

## 2021-06-26 LAB — APTT: aPTT: 137 seconds — ABNORMAL HIGH (ref 24–36)

## 2021-06-26 LAB — PROCALCITONIN: Procalcitonin: <0.1 ng/mL

## 2021-06-26 LAB — BRAIN NATRIURETIC PEPTIDE: B Natriuretic Peptide: 55.4 pg/mL (ref 0.0–100.0)

## 2021-06-26 MED ORDER — TRAZODONE HCL 50 MG PO TABS
50.0000 mg | ORAL_TABLET | Freq: Every day | ORAL | Status: DC
  Administered 2021-06-26 – 2021-06-28 (×3): 50 mg via ORAL
  Filled 2021-06-26 (×4): qty 1

## 2021-06-26 MED ORDER — SODIUM CHLORIDE 0.9 % IV SOLN
100.0000 mg | Freq: Every day | INTRAVENOUS | Status: DC
  Administered 2021-06-27 – 2021-06-29 (×3): 100 mg via INTRAVENOUS
  Filled 2021-06-26: qty 20
  Filled 2021-06-26 (×2): qty 100
  Filled 2021-06-26: qty 20

## 2021-06-26 MED ORDER — FENOFIBRATE 160 MG PO TABS
160.0000 mg | ORAL_TABLET | Freq: Every day | ORAL | Status: DC
  Administered 2021-06-27 – 2021-06-29 (×3): 160 mg via ORAL
  Filled 2021-06-26 (×3): qty 1

## 2021-06-26 MED ORDER — OXYCODONE HCL 5 MG PO TABS
20.0000 mg | ORAL_TABLET | Freq: Four times a day (QID) | ORAL | Status: DC | PRN
Start: 2021-06-26 — End: 2021-06-29
  Administered 2021-06-27 – 2021-06-29 (×7): 20 mg via ORAL
  Filled 2021-06-26 (×7): qty 4

## 2021-06-26 MED ORDER — ASPIRIN 81 MG PO CHEW
81.0000 mg | CHEWABLE_TABLET | Freq: Every day | ORAL | Status: DC
  Administered 2021-06-27 – 2021-06-29 (×3): 81 mg via ORAL
  Filled 2021-06-26 (×4): qty 1

## 2021-06-26 MED ORDER — HEPARIN (PORCINE) 25000 UT/250ML-% IV SOLN
2600.0000 [IU]/h | INTRAVENOUS | Status: AC
  Administered 2021-06-26: 1950 [IU]/h via INTRAVENOUS
  Administered 2021-06-27: 2450 [IU]/h via INTRAVENOUS
  Administered 2021-06-27: 2350 [IU]/h via INTRAVENOUS
  Administered 2021-06-28 – 2021-06-29 (×3): 2600 [IU]/h via INTRAVENOUS
  Filled 2021-06-26 (×6): qty 250

## 2021-06-26 MED ORDER — ONDANSETRON HCL 4 MG PO TABS
4.0000 mg | ORAL_TABLET | Freq: Four times a day (QID) | ORAL | Status: DC | PRN
  Filled 2021-06-26: qty 1

## 2021-06-26 MED ORDER — INSULIN REGULAR HUMAN (CONC) 500 UNIT/ML ~~LOC~~ SOPN: 40.0000 [IU] | PEN_INJECTOR | Freq: Three times a day (TID) | SUBCUTANEOUS | Status: DC

## 2021-06-26 MED ORDER — ONDANSETRON HCL 4 MG/2ML IJ SOLN
4.0000 mg | Freq: Four times a day (QID) | INTRAMUSCULAR | Status: DC | PRN
  Administered 2021-06-27 – 2021-06-29 (×3): 4 mg via INTRAVENOUS
  Filled 2021-06-26 (×3): qty 2

## 2021-06-26 MED ORDER — INSULIN REGULAR HUMAN (CONC) 500 UNIT/ML ~~LOC~~ SOPN: 70.0000 [IU] | PEN_INJECTOR | SUBCUTANEOUS | Status: DC

## 2021-06-26 MED ORDER — PANTOPRAZOLE SODIUM 40 MG PO TBEC
40.0000 mg | DELAYED_RELEASE_TABLET | Freq: Every day | ORAL | Status: DC
  Administered 2021-06-27 – 2021-06-29 (×3): 40 mg via ORAL
  Filled 2021-06-26 (×4): qty 1

## 2021-06-26 MED ORDER — GLIPIZIDE 10 MG PO TABS
10.0000 mg | ORAL_TABLET | Freq: Two times a day (BID) | ORAL | Status: DC
  Administered 2021-06-26 – 2021-06-29 (×6): 10 mg via ORAL
  Filled 2021-06-26 (×8): qty 1

## 2021-06-26 MED ORDER — DICLOFENAC EPOLAMINE 1.3 % EX PTCH: 1.0000 | MEDICATED_PATCH | Freq: Every day | CUTANEOUS | Status: DC | PRN

## 2021-06-26 MED ORDER — INSULIN ASPART 100 UNIT/ML IJ SOLN
0.0000 [IU] | Freq: Three times a day (TID) | INTRAMUSCULAR | Status: DC
  Administered 2021-06-27 – 2021-06-28 (×4): 11 [IU] via SUBCUTANEOUS
  Administered 2021-06-28: 15 [IU] via SUBCUTANEOUS
  Administered 2021-06-28 – 2021-06-29 (×3): 7 [IU] via SUBCUTANEOUS

## 2021-06-26 MED ORDER — IOHEXOL 350 MG/ML SOLN
75.0000 mL | Freq: Once | INTRAVENOUS | Status: AC | PRN
  Administered 2021-06-26: 75 mL via INTRAVENOUS

## 2021-06-26 MED ORDER — POTASSIUM CHLORIDE CRYS ER 20 MEQ PO TBCR
20.0000 meq | EXTENDED_RELEASE_TABLET | Freq: Two times a day (BID) | ORAL | Status: DC
  Administered 2021-06-26 – 2021-06-29 (×6): 20 meq via ORAL
  Filled 2021-06-26 (×8): qty 1

## 2021-06-26 MED ORDER — FAMOTIDINE 20 MG PO TABS
20.0000 mg | ORAL_TABLET | Freq: Every day | ORAL | Status: DC
  Administered 2021-06-27 – 2021-06-29 (×3): 20 mg via ORAL
  Filled 2021-06-26 (×3): qty 1

## 2021-06-26 MED ORDER — INSULIN REGULAR HUMAN (CONC) 500 UNIT/ML ~~LOC~~ SOPN
50.0000 [IU] | PEN_INJECTOR | Freq: Three times a day (TID) | SUBCUTANEOUS | Status: DC
  Administered 2021-06-27 – 2021-06-29 (×8): 50 [IU] via SUBCUTANEOUS
  Filled 2021-06-26 (×2): qty 3

## 2021-06-26 MED ORDER — BENZONATATE 100 MG PO CAPS
200.0000 mg | ORAL_CAPSULE | Freq: Three times a day (TID) | ORAL | Status: DC | PRN
  Administered 2021-06-26 – 2021-06-29 (×6): 200 mg via ORAL
  Filled 2021-06-26 (×7): qty 2

## 2021-06-26 MED ORDER — ALBUTEROL SULFATE HFA 108 (90 BASE) MCG/ACT IN AERS
4.0000 | INHALATION_SPRAY | Freq: Once | RESPIRATORY_TRACT | Status: AC
  Administered 2021-06-26: 4 via RESPIRATORY_TRACT
  Filled 2021-06-26: qty 6.7

## 2021-06-26 MED ORDER — HEPARIN BOLUS VIA INFUSION
7000.0000 [IU] | Freq: Once | INTRAVENOUS | Status: AC
  Administered 2021-06-26: 7000 [IU] via INTRAVENOUS
  Filled 2021-06-26: qty 7000

## 2021-06-26 MED ORDER — ALBUTEROL SULFATE (2.5 MG/3ML) 0.083% IN NEBU: 3.0000 mL | INHALATION_SOLUTION | Freq: Four times a day (QID) | RESPIRATORY_TRACT | Status: DC | PRN

## 2021-06-26 MED ORDER — ACETAMINOPHEN 325 MG PO TABS
650.0000 mg | ORAL_TABLET | Freq: Once | ORAL | Status: AC
  Administered 2021-06-26: 650 mg via ORAL
  Filled 2021-06-26: qty 2

## 2021-06-26 MED ORDER — ACETAMINOPHEN 325 MG PO TABS
650.0000 mg | ORAL_TABLET | Freq: Four times a day (QID) | ORAL | Status: DC | PRN
  Administered 2021-06-27: 650 mg via ORAL

## 2021-06-26 MED ORDER — TIZANIDINE HCL 4 MG PO TABS
4.0000 mg | ORAL_TABLET | Freq: Every evening | ORAL | Status: DC | PRN
  Filled 2021-06-26: qty 1

## 2021-06-26 MED ORDER — SERTRALINE HCL 50 MG PO TABS
50.0000 mg | ORAL_TABLET | Freq: Every day | ORAL | Status: DC
  Administered 2021-06-27 – 2021-06-29 (×3): 50 mg via ORAL
  Filled 2021-06-26 (×3): qty 1

## 2021-06-26 MED ORDER — SODIUM CHLORIDE 0.9 % IV SOLN
200.0000 mg | Freq: Once | INTRAVENOUS | Status: AC
  Administered 2021-06-27: 200 mg via INTRAVENOUS
  Filled 2021-06-26: qty 40

## 2021-06-26 MED ORDER — ATORVASTATIN CALCIUM 40 MG PO TABS
40.0000 mg | ORAL_TABLET | Freq: Every day | ORAL | Status: DC
  Administered 2021-06-26 – 2021-06-28 (×3): 40 mg via ORAL
  Filled 2021-06-26 (×4): qty 1

## 2021-06-26 NOTE — ED Provider Notes (Signed)
Emergency Medicine Provider Triage Evaluation Note  [name] , a 60 y.o. male  was evaluated in triage.  Pt complains of CP and leg pain, O2 sat in the 80s at [facility] today, sent to the ER. CP and SHOB x 1 month. Left leg wound x 13 days (minor break in the skin after bathing). COVID + 2 weeks ago, came to ER at that time for [facility].  Review of Systems  Positive: SHOB, CP Negative: Abdominal pain  Physical Exam  There were no vitals taken for this visit. Gen:   Awake, no distress Resp:  Tachypnic  MSK:   Moves extremities without difficulty  Other:  Minor break in skin posterior left lower leg  Medical Decision Making  Medically screening exam initiated at 11:34 AM.  Appropriate orders placed.  [name] was informed that the remainder of the evaluation will be completed by another provider, this initial triage assessment does not replace that evaluation, and the importance of remaining in the ED until their evaluation is complete.     Juan Learn, PA-C 06/26/21 1139    Juan Fuse, MD 06/28/21 [phone]

## 2021-06-26 NOTE — ED Triage Notes (Signed)
Pt to triage via [facility] from [facility].  Pt states he went to [facility] for "hole" in the back of his L lower leg x 13 days.  Pt states he had COVID approx 13 days ago and has SOB and chest pain x 1 month.  Sats low 80s at [facility].  Placed on 3 liters [place] and sent to ED.

## 2021-06-26 NOTE — ED Triage Notes (Signed)
[facility] at bedside 

## 2021-06-26 NOTE — ED Notes (Signed)
Attempted PIV X 2 without success. IV team consult placed.

## 2021-06-26 NOTE — ED Provider Notes (Signed)
Patient evaluated while in triage.  Patient had tachypnea and decreased oxygen saturation.  Denies any oxygen use at home.  Patient did recently have [place].  Patient placed on oxygen.  Patient will be transported to the emergency department for further evaluation via EMS.     Pearson Forster, NP 06/26/21 1122

## 2021-06-26 NOTE — ED Triage Notes (Signed)
Pt arrived to [facility] with Labored resp. SPO2 on room air 83 % . Pt reports he had COVID 13 days ago. Pt currently does not use home O2 but does use Nebulizer treatments  Pt eval BY NP [place] and [facility] called for trans port to ED.

## 2021-06-26 NOTE — ED Provider Notes (Signed)
[facility] EMERGENCY DEPARTMENT Provider Note   CSN: [id number] Arrival date & time: 06/26/21  1130     History No chief complaint on file.   [name] is a 60 y.o. male.  HPI     60 year old male comes in with chief complaint of shortness of breath.  He has history of diastolic CHF, DVT not on any blood thinners, recent diagnosis of COVID-19.  Patient reports that he went to urgent care because of a leg ulcer.  He wanted to make sure that the wound was healing well.  He is supposed to see wound care clinic later this month.  The ulcer came about few days back.  Today he had some tingling sensation in the heel of his left leg, same side as the ulcer which prompted him to go to the urgent care.  While at the urgent care, patient was noted to be hypoxic.  He was placed on oxygen and sent to the ER.  Patient does have some chest tightness.  He denies any new cough, fevers, chills.  He denies any wheezing.  He has been taking breathing treatments with transient relief.  No worsening orthopnea, but he usually sleeps on a recliner.  Past Medical History:  Diagnosis Date   Anemia    Arthritis    Back pain    CAD (coronary artery disease)    a. s/p DES to LAD in 05/2016   Cervical radiculopathy    Chronic diastolic CHF (congestive heart failure) (HCC)    Chronic pain    Depression    DVT (deep venous thrombosis) (HCC)    Hematemesis    Hepatic steatosis    Hyperlipidemia    Hypertension    IBS (irritable bowel syndrome)    Morbid obesity (HCC)    OSA (obstructive sleep apnea)    Pancreatitis    PE (pulmonary thromboembolism) ([place])    Peripheral neuropathy    PUD (peptic ulcer disease)    Renal disorder    Stroke [facility])    a. ?details unclear - not seen on imaging when he was admitted in 05/2017 for TIA symptoms which were felt due to cervical radiculopathy.   Thoracic aortic ectasia (HCC)    a. 4.3cm ectatic ascending thoracic aorta by CT 06/2017.     Type 2 diabetes mellitus [facility])     Patient Active Problem List   Diagnosis Date Noted   Lumbar radiculitis 03/30/2021   Morbid obesity ([place]) 01/15/2021   Personal history of colonic polyps 01/15/2021   Rectal bleeding 01/15/2021   Pain due to onychomycosis of toenails of both feet 01/15/2021   Epistaxis, recurrent 10/21/2020   Laceration of nose 10/21/2020   Syncope 10/15/2020   OSA (obstructive sleep apnea)    Bilateral carotid artery stenosis 07/28/2020   Bilateral lower extremity edema 07/28/2020   CHF (congestive heart failure) ([place]) 07/28/2020   DDD (degenerative disc disease), lumbar 07/28/2020   Diabetic peripheral neuropathy ([place]) 07/28/2020   Fatty liver 07/28/2020   Irritable bowel syndrome with diarrhea 07/28/2020   Osteoarthritis of right hip 07/28/2020   Upper GI bleed 04/28/2020   Hypertensive urgency 04/27/2020   Acute GI bleeding 04/27/2020   Mixed diabetic hyperlipidemia associated with type 2 diabetes mellitus ([place]) 04/27/2020   Rotator cuff arthropathy 10/30/2019   Cervical radiculopathy 09/24/2019   Trochanteric bursitis of right hip 09/24/2019   Chronic diastolic CHF (congestive heart failure) ([place]) 08/23/2019   ARF (acute renal failure) ([place]) 08/01/2019   Hyperkalemia  08/01/2019   Uncontrolled type 2 diabetes mellitus with hyperglycemia ([place]) 08/01/2019   AKI (acute kidney injury) ([place])    Hypotension 07/31/2019   DKA (diabetic ketoacidosis) ([place]) 05/05/2019   Blurred vision 05/05/2019   Trochanteric bursitis 02/11/2019   Long-term insulin use ([place]) 01/25/2019   Alteration consciousness 11/19/2018   Neck pain 10/09/2018   Left arm weakness 10/09/2018   B12 deficiency 08/10/2017   Persistent headaches 08/09/2017   GERD (gastroesophageal reflux disease) 06/29/2017   Left arm numbness    Cerebral embolism with cerebral infarction 06/17/2017   TIA (transient ischemic attack) 06/17/2017   Chronic back pain 12/29/2016   Depression 12/15/2016    Vitamin D deficiency 12/09/2016   Right hip pain 12/07/2016   Hypokalemia 12/07/2016   Type 2 diabetes mellitus with vascular disease ([place]) 05/31/2016   Normocytic normochromic anemia 05/31/2016   Chest pain 05/31/2016   Coronary artery disease involving native coronary artery of native heart without angina pectoris 01/06/2016   DVT (deep venous thrombosis) ([place]) 01/06/2016   Lactic acidosis 05/28/2014   Nonspecific chest pain 01/29/2014   Uncontrolled secondary diabetes with peripheral neuropathy ([place]) 01/29/2014   Obesity, Class III, BMI 40-49.9 (morbid obesity) ([place]) 01/29/2014   Snoring 01/29/2014   Dyslipidemia 01/29/2014   HTN (hypertension) 01/29/2014   Abnormal nuclear stress test 01/29/2014    Past Surgical History:  Procedure Laterality Date   CARDIAC CATHETERIZATION N/A 05/31/2016   Procedure: Left Heart Cath and Coronary Angiography;  Surgeon: Peter M Martinique, MD;  Location: [facility];  Service: Cardiovascular;  Laterality: N/A;   CARDIAC CATHETERIZATION N/A 05/31/2016   Procedure: Intravascular Pressure Wire/FFR Study;  Surgeon: Peter M Martinique, MD;  Location: [place] [facility];  Service: Cardiovascular;  Laterality: N/A;   CARDIAC CATHETERIZATION N/A 05/31/2016   Procedure: Coronary Stent Intervention;  Surgeon: Peter M Martinique, MD;  Location: [place] [facility];  Service: Cardiovascular;  Laterality: N/A;   COLONOSCOPY WITH PROPOFOL N/A 04/29/2020   Procedure: COLONOSCOPY WITH PROPOFOL;  Surgeon: Wilford Corner, MD;  Location: [place];  Service: Endoscopy;  Laterality: N/A;   ESOPHAGOGASTRODUODENOSCOPY N/A 04/29/2020   Procedure: ESOPHAGOGASTRODUODENOSCOPY (EGD);  Surgeon: Wilford Corner, MD;  Location: [place];  Service: Endoscopy;  Laterality: N/A;   LEFT HEART CATH AND CORONARY ANGIOGRAPHY N/A 12/08/2017   Procedure: LEFT HEART CATH AND CORONARY ANGIOGRAPHY;  Surgeon: Leonie Man, MD;  Location: [place] CV LAB;  Service: Cardiovascular;   Laterality: N/A;   LEFT HEART CATHETERIZATION WITH CORONARY ANGIOGRAM N/A 02/03/2014   Procedure: LEFT HEART CATHETERIZATION WITH CORONARY ANGIOGRAM;  Surgeon: Pixie Casino, MD;  Location: [facility] CATH LAB;  Service: Cardiovascular;  Laterality: N/A;   left leg stent      POLYPECTOMY  04/29/2020   Procedure: POLYPECTOMY;  Surgeon: Wilford Corner, MD;  Location: [facility] ENDOSCOPY;  Service: Endoscopy;;       Family History  Problem Relation Age of Onset   Cancer Father    Hypertension Mother    Diabetes Mother    Breast cancer Mother    Hypertension Brother    Diabetes Brother    Hypertension Sister    Diabetes Sister     Social History   Tobacco Use   Smoking status: Never   Smokeless tobacco: Never  Vaping Use   Vaping Use: Never used  Substance Use Topics   Alcohol use: No   Drug use: No    Home Medications Prior to Admission medications   Medication Sig Start Date End  Date Taking? Authorizing Provider  Accu-Chek FastClix Lancets MISC 1 Device by Other route 2 (two) times daily.  03/20/19  Yes [provider]  ACCU-CHEK GUIDE test strip 1 each by Other route 2 (two) times daily. 01/08/21  Yes [provider]  albuterol (VENTOLIN HFA) 108 (90 Base) MCG/ACT inhaler Inhale 2 puffs into the lungs every 6 (six) hours as needed for wheezing or shortness of breath. As Directed   Yes [provider]  amoxicillin-clavulanate (AUGMENTIN) 875-125 MG tablet Take 1 tablet by mouth 2 (two) times daily. 06/26/21  Yes [provider]  aspirin 81 MG chewable tablet Chew 1 tablet (81 mg total) by mouth daily. 06/01/16  Yes Elwin Mocha, MD  atorvastatin (LIPITOR) 40 MG tablet Take 40 mg by mouth at bedtime.   Yes [provider]  carvedilol (COREG) 12.5 MG tablet Take 12.5 mg by mouth 2 (two) times daily. 10/22/20  Yes [provider]  diclofenac (FLECTOR) 1.3 % PTCH Place 1 patch onto the skin daily as needed for pain. 03/02/20  Yes [provider]  famotidine (PEPCID) 20 MG tablet Take 20 mg by mouth daily. 03/05/20  Yes [provider]  fenofibrate (TRICOR) 145 MG tablet Take 1 tablet (145 mg total) by mouth daily. 06/25/21  Yes Cleaver, Jossie Ng, NP  furosemide (LASIX) 40 MG tablet Take 1 tablet (40 mg total) by mouth daily. PLEASE RESUME ONLY FROM 10/21/2020. DOSE HAS BEEN CHANGED. Patient taking differently: Take 40 mg by mouth 3 (three) times daily. 10/21/20 10/21/21 Yes Bonnielee Haff, MD  glipiZIDE (GLUCOTROL) 10 MG tablet Take 10 mg by mouth 2 (two) times daily. 05/30/17  Yes [provider]  HUMULIN R U-500 KWIKPEN 500 UNIT/ML kwikpen Inject 70-90 Units into the skin See admin instructions. Inject under the skin three times daily as directed or sliding scale: 06/04/20  Yes [provider]  Insulin Pen Needle 31G X 5 MM MISC Use 1 needle daily to inject insulin as prescribed Patient taking differently: 1 each. Use 1 needle daily to inject insulin as prescribed 06/18/17  Yes Barton Dubois, MD  isosorbide mononitrate (IMDUR) 30 MG 24 hr tablet TAKE 1 TABLET BY MOUTH DAILY Patient taking differently: Take 30 mg by mouth daily. 12/24/20  Yes Hilty, Nadean Corwin, MD  lisinopril (ZESTRIL) 5 MG tablet Take 5 mg by mouth daily. 12/04/20  Yes [provider]  nitroGLYCERIN (NITROSTAT) 0.4 MG SL tablet Place 1 tablet (0.4 mg total) under the tongue every 5 (five) minutes as needed for chest pain. 02/06/19  Yes Skeet Latch, MD  ondansetron (ZOFRAN) 4 MG tablet Take 4 mg by mouth 2 (two) times daily as needed for nausea/vomiting. 04/21/20  Yes [provider]  Oxycodone HCl 20 MG TABS Take 1 tablet by mouth 4 (four) times daily as needed for pain. LF on 05-31-21 # 120 05/31/21  Yes [provider]  pantoprazole (PROTONIX) 40 MG tablet Take 40 mg by mouth daily. 04/13/21  Yes [provider]  potassium chloride (MICRO-K) 10 MEQ CR capsule Take 20 mEq by mouth in the morning and at  bedtime. 04/03/20  Yes [provider]  sertraline (ZOLOFT) 50 MG tablet Take 50 mg by mouth daily. 12/17/20  Yes [provider]  tiZANidine (ZANAFLEX) 4 MG tablet Take 4 mg by mouth at bedtime as needed for muscle spasms. 03/31/20  Yes [provider]  traZODone (DESYREL) 50 MG tablet Take 1 tablet (50 mg total) by mouth at bedtime.  12/15/16  Yes Burns, Arloa Koh, MD  Vitamin D, Ergocalciferol, (DRISDOL) 1.25 MG (50000 UT) CAPS capsule Take 50,000 Units by mouth every Monday. 08/15/19  Yes [provider]    Allergies    Coconut flavor [flavoring agent], Coconut oil, Ibuprofen, Aleve [naproxen], Nsaids, and Vascepa [icosapent ethyl]  Review of Systems   Review of Systems  Constitutional:  Positive for activity change.  Respiratory:  Positive for shortness of breath.   Cardiovascular:  Positive for chest pain.  Gastrointestinal:  Negative for nausea and vomiting.  Skin:  Positive for wound.  All other systems reviewed and are negative.  Physical Exam Updated Vital Signs BP 117/60   Pulse 96   Temp 98.5 F (36.9 C) (Oral)   Resp 20   Ht '5\' 10"'$  (1.778 m)   Wt (!) 148 kg   SpO2 96%   BMI 46.82 kg/m   Physical Exam Vitals and nursing note reviewed.  Constitutional:      Appearance: He is well-developed.  HENT:     Head: Atraumatic.  Cardiovascular:     Rate and Rhythm: Normal rate.  Pulmonary:     Effort: Pulmonary effort is normal.  Abdominal:     Tenderness: There is no abdominal tenderness.  Musculoskeletal:        General: Swelling present.     Cervical back: Neck supple.     Right lower leg: No edema.     Left lower leg: No edema.  Skin:    General: Skin is warm.  Neurological:     Mental Status: He is alert and oriented to person, place, and time.       ED Results / Procedures / Treatments   Labs (all labs ordered are listed, but only abnormal results are displayed) Labs Reviewed  BASIC METABOLIC PANEL - Abnormal; Notable  for the following components:      Result Value   Glucose, Bld 227 (*)    All other components within normal limits  CBC - Abnormal; Notable for the following components:   RBC 4.11 (*)    Hemoglobin 12.9 (*)    All other components within normal limits  CBG MONITORING, ED - Abnormal; Notable for the following components:   Glucose-Capillary 251 (*)    All other components within normal limits  TROPONIN I (HIGH SENSITIVITY) - Abnormal; Notable for the following components:   Troponin I (High Sensitivity) 167 (*)    All other components within normal limits  BRAIN NATRIURETIC PEPTIDE  APTT  PROTIME-INR  HEPARIN LEVEL (UNFRACTIONATED)  CBC  TROPONIN I (HIGH SENSITIVITY)    EKG EKG Interpretation  Date/Time:  Saturday June 26 2021 15:53:33 EDT Ventricular Rate:  98 PR Interval:  167 QRS Duration: 85 QT Interval:  349 QTC Calculation: 446 R Axis:   163 Text Interpretation: Sinus rhythm Left posterior fascicular block Consider anterior infarct No acute changes No significant change since last tracing Confirmed by Varney Biles [phone]) on 06/26/2021 3:55:41 PM  Radiology DG Chest 2 View  Result Date: 06/26/2021 CLINICAL DATA:  Chest pain and shortness of breath EXAM: CHEST - 2 VIEW COMPARISON:  06/15/2021 FINDINGS: Stable cardiomegaly with vascular congestion. Minor basilar atelectasis. Negative for pneumonia, CHF, effusion or pneumothorax. Trachea midline. Degenerative changes of the spine. Lateral view is limited with motion artifact. No severe compression fracture. IMPRESSION: Stable chest exam. No interval change or acute process by plain radiography. Electronically Signed   By: Jerilynn Mages.  Shick M.D.   On: 06/26/2021 12:19  CT Angio Chest PE W and/or Wo Contrast  Result Date: 06/26/2021 CLINICAL DATA:  Shortness of breath. High probability for pulmonary embolus. EXAM: CT ANGIOGRAPHY CHEST WITH CONTRAST TECHNIQUE: Multidetector CT imaging of the chest was performed using the  standard protocol during bolus administration of intravenous contrast. Multiplanar CT image reconstructions and MIPs were obtained to evaluate the vascular anatomy. CONTRAST:  9m OMNIPAQUE IOHEXOL 350 MG/ML SOLN COMPARISON:  Radiograph earlier today.  Chest CTA 07/31/2019 FINDINGS: Cardiovascular: Positive for pulmonary embolus with filling defects involving the distal right main pulmonary artery extending into the upper, middle, and lower lobar branches. Thrombus is subtotally occlusive. Segmental filling defects in the left lower lobe pulmonary artery, and subsegmental left upper lobe pulmonary arterial filling defects. There is dilatation of the main pulmonary artery at 4.8 cm, chronic. Elevated RV to LV ratio of 1.08. Heart size upper normal. There is no pericardial effusion. Coronary artery calcification or stent. No aortic dissection. Mediastinum/Nodes: No enlarged mediastinal or hilar lymph nodes. Patulous esophagus without wall thickening. No thyroid nodule. Lungs/Pleura: Patchy ground-glass opacity in the right upper lobe, slightly perifissural but not peripheral or wedge-shaped. Minor dependent atelectasis in the lower lobes. No pleural fluid. No pulmonary mass. Upper Abdomen: Enlarged liver with steatosis. No acute upper abdominal findings. Musculoskeletal: Diffuse thoracic spondylosis with endplate spurring and degenerative disc disease. No acute osseous abnormalities are seen. Review of the MIP images confirms the above findings. IMPRESSION: 1. Positive for pulmonary embolus with moderate clot burden and evidence of right heart strain, RV to LV ratio of 1.08. 2. Patchy ground-glass opacity in the right upper lobe, slightly perifissural but not peripheral or wedge-shaped. Favor pneumonia over pulmonary infarct. 3. Chronic dilatation of the main pulmonary artery consistent with pulmonary arterial hypertension. 4. Coronary artery calcification or stent. 5. Incidental hepatomegaly and steatosis in the  upper abdomen. Critical Value/emergent results were called by telephone at the time of interpretation on 06/26/2021 at 6:58 pm to [facility] who verbally acknowledged these results. Electronically Signed   By: MKeith RakeM.D.   On: 06/26/2021 18:58    Procedures .Critical Care  Date/Time: 06/26/2021 7:19 PM Performed by: NVarney Biles MD Authorized by: NVarney Biles MD   Critical care provider statement:    Critical care time (minutes):  60   Critical care was necessary to treat or prevent imminent or life-threatening deterioration of the following conditions:  Respiratory failure and circulatory failure   Critical care was time spent personally by me on the following activities:  Discussions with consultants, evaluation of patient's response to treatment, examination of patient, ordering and performing treatments and interventions, ordering and review of laboratory studies, ordering and review of radiographic studies, pulse oximetry, re-evaluation of patient's condition, obtaining history from patient or surrogate, review of old charts, blood draw for specimens and development of treatment plan with patient or surrogate   Medications Ordered in ED Medications  acetaminophen (TYLENOL) tablet 650 mg (has no administration in time range)  albuterol (VENTOLIN HFA) 108 (90 Base) MCG/ACT inhaler 4 puff (has no administration in time range)  heparin bolus via infusion 7,000 Units (has no administration in time range)  heparin ADULT infusion 100 units/mL (25000 units/2538m (has no administration in time range)  iohexol (OMNIPAQUE) 350 MG/ML injection 75 mL (75 mLs Intravenous Contrast Given 06/26/21 1830)    ED Course  I have reviewed the triage vital signs and the nursing notes.  Pertinent labs & imaging results that were available during my care of  the patient were reviewed by me and considered in my medical decision making (see chart for details).  Clinical Course as of  06/26/21 1920  Sat Jun 26, 2021  1919 CT confirms bilateral PE.  Troponin is mildly elevated, but BNP is normal.  Hemodynamically he has been stable.  Requiring nasal cannula O2.  Stable for admission at this time.  Heparin ordered. [AN]    Clinical Course User Index [AN] Varney Biles, MD   MDM Rules/Calculators/A&P                            60 year old male who is 10 days post COVID-19 diagnosis comes in a chief complaint of shortness of breath.  He has history of CHF.  He is having exertional shortness of breath along with some chest tightness. additionally he is also concerned about his leg ulcer.   The leg ulcer looks uncomplicated at this time.  There is no surrounding erythema or signs of infection  Patient usually does not require oxygen, but now his O2 sats dropped to 86% on room air.  He has been placed on 2 L of oxygen.  He is visibly tachypneic.  No respiratory distress at this time.  Lung exam is not indicative of significant rales, no JVD  Differential diagnosis includes pulmonary edema, CHF, COVID-19 pneumonia, PE.  Appropriate labs ordered.  CT scan will be ordered as well.  He will need admission.  Final Clinical Impression(s) / ED Diagnoses Final diagnoses:  Acute hypoxemic respiratory failure ([place])  COVID-19  Acute pulmonary embolism without acute cor pulmonale, unspecified pulmonary embolism type [facility])    Rx / DC Orders ED Discharge Orders     None        Varney Biles, MD 06/26/21 1920

## 2021-06-26 NOTE — ED Notes (Signed)
IV team at bedside attempting [place] IV.

## 2021-06-26 NOTE — ED Notes (Signed)
[facility] called at 1101 and in route

## 2021-06-26 NOTE — Progress Notes (Signed)
ANTICOAGULATION CONSULT NOTE - Initial Consult  Pharmacy Consult for heparin Indication: pulmonary embolus  Allergies  Allergen Reactions   Coconut Flavor [Flavoring Agent] Hives   Coconut Oil Hives   Ibuprofen Other (See Comments)    Made gastric ulcers worse   Aleve [Naproxen] Other (See Comments)    DUE TO KIDNEYS   Nsaids Other (See Comments)    Stomach ulcers    Vascepa [Icosapent Ethyl] Other (See Comments)    headaches, chest pain, similar to sx of a stroke, hypotension     Patient Measurements: Height: '5\' 10"'$  (177.8 cm) Weight: (!) 148 kg (326 lb 4.5 oz) IBW/kg (Calculated) : 73 Heparin Dosing Weight: 108.3 kg  Vital Signs: Temp: 98.5 F (36.9 C) (09/03 1139) Temp Source: Oral (09/03 1139) BP: 117/60 (09/03 1835) Pulse Rate: 96 (09/03 1835)  Labs: Recent Labs    06/26/21 1139  HGB 12.9*  HCT 39.1  PLT 209  CREATININE 0.84  TROPONINIHS 167*    Estimated Creatinine Clearance: 141.4 mL/min (by C-G formula based on SCr of 0.84 mg/dL).   Medical History: Past Medical History:  Diagnosis Date   Anemia    Arthritis    Back pain    CAD (coronary artery disease)    a. s/p DES to LAD in 05/2016   Cervical radiculopathy    Chronic diastolic CHF (congestive heart failure) (HCC)    Chronic pain    Depression    DVT (deep venous thrombosis) (HCC)    Hematemesis    Hepatic steatosis    Hyperlipidemia    Hypertension    IBS (irritable bowel syndrome)    Morbid obesity (HCC)    OSA (obstructive sleep apnea)    Pancreatitis    PE (pulmonary thromboembolism) ([place])    Peripheral neuropathy    PUD (peptic ulcer disease)    Renal disorder    Stroke [facility])    a. ?details unclear - not seen on imaging when he was admitted in 05/2017 for TIA symptoms which were felt due to cervical radiculopathy.   Thoracic aortic ectasia (HCC)    a. 4.3cm ectatic ascending thoracic aorta by CT 06/2017.    Type 2 diabetes mellitus (HCC)     Medications: see  MAR  Assessment: 60 yo M with PMHx of CHF, DVT (no AC), recent COVID 19 dx. Found to have PE on CTA in ED today, RV to LV ratio of 1.08, moderate clot burden, and evidence of RHS.   Baseline CBC wnl.  Goal of Therapy:  Heparin level 0.3-0.7 units/ml Monitor platelets by anticoagulation protocol: Yes   Plan:  Give 7000 units bolus x 1 Start heparin infusion at 1950 units/hr Check anti-Xa level in 6 hours and daily while on heparin Continue to monitor H&H and platelets  Joetta Manners, PharmD, [facility] Emergency Medicine Clinical Pharmacist ED RPh Phone: [place]: [phone]

## 2021-06-26 NOTE — ED Triage Notes (Signed)
Pt placed on 2 liters nasal O2 and SPO2 90%.

## 2021-06-26 NOTE — ED Notes (Signed)
Brother Ilona Sorrel [phone] would like an update and to let pt know he made it home safe

## 2021-06-26 NOTE — H&P (Addendum)
History and Physical    DEONDRE MEN [id number] DOB: 16-Feb-1961 DOA: 06/26/2021  PCP: [name], FNP  Patient coming from: Home  I have personally briefly reviewed patient's old medical records in [place]  Chief Complaint: SOB  HPI: [name] is a 60 y.o. male with medical history significant of DM2, HTN, prior DVT/PE no longer on anticoagulation, OSA not on CPAP, dCHF.  Pt recently diagnosed with COVID-19 x11 days ago.  At that time he presented to ED with 2 day history of cough, fever, SOB, body aches.  He has had vaccine + 1 booster.  He was discharged home at that time.  SOB has gotten slightly worse in past couple of days per pt.  He went to UC today to have a leg ulcer checked out (unrelated to breathing).  While at [facility] he was noted to have increased WOB and O2 sats in the 80s on RA.  Sent in to ED.   ED Course: In the ED CTA chest performed: Pt with mod clot burden PE with + RHS.  Also has mild patchy opacity / PNA findings (COVID) localized to RUL.  Procalcitonin neg.  WBC 10.5k.  No fever.  Per pt: no fevers any more, still has occasional body aches, does endorse SOB.  Cough still present though unchanged.   Review of Systems: As per HPI, otherwise all review of systems negative.  Past Medical History:  Diagnosis Date   Anemia    Arthritis    Back pain    CAD (coronary artery disease)    a. s/p DES to LAD in 05/2016   Cervical radiculopathy    Chronic diastolic CHF (congestive heart failure) (HCC)    Chronic pain    Depression    DVT (deep venous thrombosis) (HCC)    Hematemesis    Hepatic steatosis    Hyperlipidemia    Hypertension    IBS (irritable bowel syndrome)    Morbid obesity (HCC)    OSA (obstructive sleep apnea)    Pancreatitis    PE (pulmonary thromboembolism) ([place])    Peripheral neuropathy    PUD (peptic ulcer disease)    Renal disorder    Stroke [facility])    a. ?details unclear - not seen on imaging when  he was admitted in 05/2017 for TIA symptoms which were felt due to cervical radiculopathy.   Thoracic aortic ectasia (HCC)    a. 4.3cm ectatic ascending thoracic aorta by CT 06/2017.    Type 2 diabetes mellitus ([place])     Past Surgical History:  Procedure Laterality Date   CARDIAC CATHETERIZATION N/A 05/31/2016   Procedure: Left Heart Cath and Coronary Angiography;  Surgeon: Peter M Martinique, MD;  Location: [place] CV LAB;  Service: Cardiovascular;  Laterality: N/A;   CARDIAC CATHETERIZATION N/A 05/31/2016   Procedure: Intravascular Pressure Wire/FFR Study;  Surgeon: Peter M Martinique, MD;  Location: [place] CV LAB;  Service: Cardiovascular;  Laterality: N/A;   CARDIAC CATHETERIZATION N/A 05/31/2016   Procedure: Coronary Stent Intervention;  Surgeon: Peter M Martinique, MD;  Location: [place] CV LAB;  Service: Cardiovascular;  Laterality: N/A;   COLONOSCOPY WITH PROPOFOL N/A 04/29/2020   Procedure: COLONOSCOPY WITH PROPOFOL;  Surgeon: Wilford Corner, MD;  Location: [place];  Service: Endoscopy;  Laterality: N/A;   ESOPHAGOGASTRODUODENOSCOPY N/A 04/29/2020   Procedure: ESOPHAGOGASTRODUODENOSCOPY (EGD);  Surgeon: Wilford Corner, MD;  Location: [place];  Service: Endoscopy;  Laterality: N/A;   LEFT HEART CATH AND CORONARY ANGIOGRAPHY  N/A 12/08/2017   Procedure: LEFT HEART CATH AND CORONARY ANGIOGRAPHY;  Surgeon: Leonie Man, MD;  Location: [place] CV LAB;  Service: Cardiovascular;  Laterality: N/A;   LEFT HEART CATHETERIZATION WITH CORONARY ANGIOGRAM N/A 02/03/2014   Procedure: LEFT HEART CATHETERIZATION WITH CORONARY ANGIOGRAM;  Surgeon: Pixie Casino, MD;  Location: [facility] CATH LAB;  Service: Cardiovascular;  Laterality: N/A;   left leg stent      POLYPECTOMY  04/29/2020   Procedure: POLYPECTOMY;  Surgeon: Wilford Corner, MD;  Location: [facility] ENDOSCOPY;  Service: Endoscopy;;     reports that he has never smoked. He has never used smokeless tobacco. He reports that he does not  drink alcohol and does not use drugs.  Allergies  Allergen Reactions   Coconut Flavor [Flavoring Agent] Hives   Coconut Oil Hives   Ibuprofen Other (See Comments)    Made gastric ulcers worse   Aleve [Naproxen] Other (See Comments)    DUE TO KIDNEYS   Nsaids Other (See Comments)    Stomach ulcers    Vascepa [Icosapent Ethyl] Other (See Comments)    headaches, chest pain, similar to sx of a stroke, hypotension     Family History  Problem Relation Age of Onset   Cancer Father    Hypertension Mother    Diabetes Mother    Breast cancer Mother    Hypertension Brother    Diabetes Brother    Hypertension Sister    Diabetes Sister      Prior to Admission medications   Medication Sig Start Date End Date Taking? Authorizing Provider  Accu-Chek FastClix Lancets MISC 1 Device by Other route 2 (two) times daily.  03/20/19  Yes [provider]  ACCU-CHEK GUIDE test strip 1 each by Other route 2 (two) times daily. 01/08/21  Yes [provider]  albuterol (VENTOLIN HFA) 108 (90 Base) MCG/ACT inhaler Inhale 2 puffs into the lungs every 6 (six) hours as needed for wheezing or shortness of breath. As Directed   Yes [provider]  amoxicillin-clavulanate (AUGMENTIN) 875-125 MG tablet Take 1 tablet by mouth 2 (two) times daily. 06/26/21  Yes [provider]  aspirin 81 MG chewable tablet Chew 1 tablet (81 mg total) by mouth daily. 06/01/16  Yes Elwin Mocha, MD  atorvastatin (LIPITOR) 40 MG tablet Take 40 mg by mouth at bedtime.   Yes [provider]  carvedilol (COREG) 12.5 MG tablet Take 12.5 mg by mouth 2 (two) times daily. 10/22/20  Yes [provider]  diclofenac (FLECTOR) 1.3 % PTCH Place 1 patch onto the skin daily as needed for pain. 03/02/20  Yes [provider]  famotidine (PEPCID) 20 MG tablet Take 20 mg by mouth daily. 03/05/20  Yes [provider]  fenofibrate (TRICOR) 145 MG tablet Take 1 tablet (145 mg total) by  mouth daily. 06/25/21  Yes Cleaver, Jossie Ng, NP  furosemide (LASIX) 40 MG tablet Take 1 tablet (40 mg total) by mouth daily. PLEASE RESUME ONLY FROM 10/21/2020. DOSE HAS BEEN CHANGED. Patient taking differently: Take 40 mg by mouth 3 (three) times daily. 10/21/20 10/21/21 Yes Bonnielee Haff, MD  glipiZIDE (GLUCOTROL) 10 MG tablet Take 10 mg by mouth 2 (two) times daily. 05/30/17  Yes [provider]  HUMULIN R U-500 KWIKPEN 500 UNIT/ML kwikpen Inject 70-90 Units into the skin See admin instructions. Inject under the skin three times daily as directed or sliding scale: 06/04/20  Yes [provider]  Insulin Pen Needle 31G X 5  MM MISC Use 1 needle daily to inject insulin as prescribed Patient taking differently: 1 each. Use 1 needle daily to inject insulin as prescribed 06/18/17  Yes Barton Dubois, MD  isosorbide mononitrate (IMDUR) 30 MG 24 hr tablet TAKE 1 TABLET BY MOUTH DAILY Patient taking differently: Take 30 mg by mouth daily. 12/24/20  Yes Hilty, Nadean Corwin, MD  lisinopril (ZESTRIL) 5 MG tablet Take 5 mg by mouth daily. 12/04/20  Yes [provider]  nitroGLYCERIN (NITROSTAT) 0.4 MG SL tablet Place 1 tablet (0.4 mg total) under the tongue every 5 (five) minutes as needed for chest pain. 02/06/19  Yes Skeet Latch, MD  ondansetron (ZOFRAN) 4 MG tablet Take 4 mg by mouth 2 (two) times daily as needed for nausea/vomiting. 04/21/20  Yes [provider]  Oxycodone HCl 20 MG TABS Take 1 tablet by mouth 4 (four) times daily as needed for pain. LF on 05-31-21 # 120 05/31/21  Yes [provider]  pantoprazole (PROTONIX) 40 MG tablet Take 40 mg by mouth daily. 04/13/21  Yes [provider]  potassium chloride (MICRO-K) 10 MEQ CR capsule Take 20 mEq by mouth in the morning and at bedtime. 04/03/20  Yes [provider]  sertraline (ZOLOFT) 50 MG tablet Take 50 mg by mouth daily. 12/17/20  Yes [provider]  tiZANidine (ZANAFLEX) 4 MG tablet  Take 4 mg by mouth at bedtime as needed for muscle spasms. 03/31/20  Yes [provider]  traZODone (DESYREL) 50 MG tablet Take 1 tablet (50 mg total) by mouth at bedtime. 12/15/16  Yes Burns, Arloa Koh, MD  Vitamin D, Ergocalciferol, (DRISDOL) 1.25 MG (50000 UT) CAPS capsule Take 50,000 Units by mouth every Monday. 08/15/19  Yes [provider]    Physical Exam: Vitals:   06/26/21 1720 06/26/21 1835 06/26/21 1900 06/26/21 2200  BP: 112/73 117/60  (!) 124/91  Pulse: 98 96  97  Resp: (!) 21 20    Temp:      TempSrc:      SpO2: 97% 96%  93%  Weight:   (!) 148 kg   Height:   '5\' 10"'$  (1.778 m)     Constitutional: ill appearing with moderate dyspnea Eyes: PERRL, lids and conjunctivae normal ENMT: Mucous membranes are moist. Posterior pharynx clear of any exudate or lesions.Normal dentition.  Neck: normal, supple, no masses, no thyromegaly Respiratory: + increased WOB, moderate dyspnea. Cardiovascular: Regular rate and rhythm, no murmurs / rubs / gallops. No extremity edema. 2+ pedal pulses. No carotid bruits.  Abdomen: no tenderness, no masses palpated. No hepatosplenomegaly. Bowel sounds positive.  Musculoskeletal: no clubbing / cyanosis. No joint deformity upper and lower extremities. Good ROM, no contractures. Normal muscle tone.  Skin:    Neurologic: CN 2-12 grossly intact. Sensation intact, DTR normal. Strength 5/5 in all 4.  Psychiatric: Normal judgment and insight. Alert and oriented x 3. Normal mood.    Labs on Admission: I have personally reviewed following labs and imaging studies  CBC: Recent Labs  Lab 06/26/21 1139  WBC 10.5  HGB 12.9*  HCT 39.1  MCV 95.1  PLT [id number]   Basic Metabolic Panel: Recent Labs  Lab 06/26/21 1139  NA 138  K 3.7  CL 98  CO2 28  GLUCOSE 227*  BUN 13  CREATININE 0.84  CALCIUM 9.3   GFR: Estimated Creatinine Clearance: 141.4 mL/min (by C-G formula based on SCr of 0.84 mg/dL). Liver Function Tests: No results for  input(s): AST, ALT, ALKPHOS, BILITOT, PROT,  ALBUMIN in the last 168 hours. No results for input(s): LIPASE, AMYLASE in the last 168 hours. No results for input(s): AMMONIA in the last 168 hours. Coagulation Profile: Recent Labs  Lab 06/26/21 2001  INR 1.2   Cardiac Enzymes: No results for input(s): CKTOTAL, CKMB, CKMBINDEX, TROPONINI in the last 168 hours. BNP (last 3 results) No results for input(s): PROBNP in the last 8760 hours. HbA1C: No results for input(s): HGBA1C in the last 72 hours. CBG: Recent Labs  Lab 06/26/21 1556 06/26/21 2149  GLUCAP 251* 262*   Lipid Profile: No results for input(s): CHOL, HDL, LDLCALC, TRIG, CHOLHDL, LDLDIRECT in the last 72 hours. Thyroid Function Tests: No results for input(s): TSH, T4TOTAL, FREET4, T3FREE, THYROIDAB in the last 72 hours. Anemia Panel: No results for input(s): VITAMINB12, FOLATE, FERRITIN, TIBC, IRON, RETICCTPCT in the last 72 hours. Urine analysis:    Component Value Date/Time   COLORURINE COLORLESS (A) 06/05/2021 1422   APPEARANCEUR CLEAR 06/05/2021 1422   LABSPEC 1.005 06/05/2021 1422   PHURINE 7.0 06/05/2021 1422   GLUCOSEU 150 (A) 06/05/2021 1422   HGBUR SMALL (A) 06/05/2021 1422   BILIRUBINUR NEGATIVE 06/05/2021 1422   KETONESUR NEGATIVE 06/05/2021 1422   PROTEINUR NEGATIVE 06/05/2021 1422   UROBILINOGEN 0.2 05/27/2014 2312   NITRITE NEGATIVE 06/05/2021 1422   LEUKOCYTESUR NEGATIVE 06/05/2021 1422    Radiological Exams on Admission: DG Chest 2 View  Result Date: 06/26/2021 CLINICAL DATA:  Chest pain and shortness of breath EXAM: CHEST - 2 VIEW COMPARISON:  06/15/2021 FINDINGS: Stable cardiomegaly with vascular congestion. Minor basilar atelectasis. Negative for pneumonia, CHF, effusion or pneumothorax. Trachea midline. Degenerative changes of the spine. Lateral view is limited with motion artifact. No severe compression fracture. IMPRESSION: Stable chest exam. No interval change or acute process by plain  radiography. Electronically Signed   By: Jerilynn Mages.  Shick M.D.   On: 06/26/2021 12:19   CT Angio Chest PE W and/or Wo Contrast  Result Date: 06/26/2021 CLINICAL DATA:  Shortness of breath. High probability for pulmonary embolus. EXAM: CT ANGIOGRAPHY CHEST WITH CONTRAST TECHNIQUE: Multidetector CT imaging of the chest was performed using the standard protocol during bolus administration of intravenous contrast. Multiplanar CT image reconstructions and MIPs were obtained to evaluate the vascular anatomy. CONTRAST:  57m OMNIPAQUE IOHEXOL 350 MG/ML SOLN COMPARISON:  Radiograph earlier today.  Chest CTA 07/31/2019 FINDINGS: Cardiovascular: Positive for pulmonary embolus with filling defects involving the distal right main pulmonary artery extending into the upper, middle, and lower lobar branches. Thrombus is subtotally occlusive. Segmental filling defects in the left lower lobe pulmonary artery, and subsegmental left upper lobe pulmonary arterial filling defects. There is dilatation of the main pulmonary artery at 4.8 cm, chronic. Elevated RV to LV ratio of 1.08. Heart size upper normal. There is no pericardial effusion. Coronary artery calcification or stent. No aortic dissection. Mediastinum/Nodes: No enlarged mediastinal or hilar lymph nodes. Patulous esophagus without wall thickening. No thyroid nodule. Lungs/Pleura: Patchy ground-glass opacity in the right upper lobe, slightly perifissural but not peripheral or wedge-shaped. Minor dependent atelectasis in the lower lobes. No pleural fluid. No pulmonary mass. Upper Abdomen: Enlarged liver with steatosis. No acute upper abdominal findings. Musculoskeletal: Diffuse thoracic spondylosis with endplate spurring and degenerative disc disease. No acute osseous abnormalities are seen. Review of the MIP images confirms the above findings. IMPRESSION: 1. Positive for pulmonary embolus with moderate clot burden and evidence of right heart strain, RV to LV ratio of 1.08. 2. Patchy  ground-glass opacity in the right upper  lobe, slightly perifissural but not peripheral or wedge-shaped. Favor pneumonia over pulmonary infarct. 3. Chronic dilatation of the main pulmonary artery consistent with pulmonary arterial hypertension. 4. Coronary artery calcification or stent. 5. Incidental hepatomegaly and steatosis in the upper abdomen. Critical Value/emergent results were called by telephone at the time of interpretation on 06/26/2021 at 6:58 pm to provider [facility] , who verbally acknowledged these results. Electronically Signed   By: Keith Rake M.D.   On: 06/26/2021 18:58    EKG: Independently reviewed.  Assessment/Plan Principal Problem:   Acute pulmonary embolism (HCC) Active Problems:   HTN (hypertension)   Recurrent deep vein thrombosis (DVT) (HCC)   Type 2 diabetes mellitus with vascular disease (HCC)   Chronic diastolic CHF (congestive heart failure) (HCC)   Diabetic ulcer of lower leg (HCC)   Acute respiratory failure with hypoxia (HCC)   COVID-19 virus infection    Acute PE with RHS - Heparin gtt 2d echo BLE DVT dopplers Will send message to pulm for AM consult to see if he is candidate for catheter directed TPA. Tele monitor COVID-19 -  Mild on CT scan with just RUL involvement Does have O2 requirement though I suspect much of this is secondary to the PE with RHS. COVID pathway Daily labs Procalcitonin neg CRP pending Remdesivir - though suspect this may have limited benefit this late in the course. Holding off on steroids for the moment: Suspect primary driver of hypoxia = the PE Suspect that steroids will just drive his DM out of control rather than help breathing much. Acute resp failure with hypoxia - Due to a combination of #1 and #2 above. Suspect that this is primarily due to the PE, and to a lesser extent the mild COVID-19 (COVID not nearly as impressive on CT scan as I would expect to be the major cause of hypoxia). Cont pulse ox DM2  - Cont glipizide '10mg'$  BID U-500 50u TID AC (takes 60-100 on an SSI at home) Resistant scale SSI novolog TID [facility] Doesn't look like he takes a basal insulin at home. Will get DM coordinator consult due to difficult to control DM historically. HTN - Holding home BP meds due to soft BPs in ED: Imdur, coreg, Lasix, and Lisinopril on hold. dCHF - No exacerbation today Holding BP meds, lasix as above. Leg ulcer - No erythema or signs of infection Will get wound care consult.  DVT prophylaxis: Heparin gtt Code Status: Full Family Communication: No family in room Disposition Plan: Home after breathing improved Consults called: None Admission status: Admit to inpatient  Severity of Illness: The appropriate patient status for this patient is INPATIENT. Inpatient status is judged to be reasonable and necessary in order to provide the required intensity of service to ensure the patient's safety. The patient's presenting symptoms, physical exam findings, and initial radiographic and laboratory data in the context of their chronic comorbidities is felt to place them at high risk for further clinical deterioration. Furthermore, it is not anticipated that the patient will be medically stable for discharge from the hospital within 2 midnights of admission. The following factors support the patient status of inpatient.   IP status for PE with RHS.  Patient has acute respiratory failure with hypoxia due to having a new oxygen requirement.  That is the patient has a PaO2 < 60 (pulse Ox < 90%) on room air.  * I certify that at the point of admission it is my clinical judgment that the patient will require inpatient  hospital care spanning beyond 2 midnights from the point of admission due to high intensity of service, high risk for further deterioration and high frequency of surveillance required.*   Fumio Vandam M. DO [facility]  How to contact the [facility] Attending or Consulting provider [place]  or covering provider during after hours [place], for this patient?  Check the care team in [facility] and look for a) attending/consulting [facility] provider listed and b) the [facility] team listed Log into [email or web address]  Amion Physician Scheduling and messaging for groups and whole hospitals  On call and physician scheduling software for group practices, residents, hospitalists and other medical providers for call, clinic, rotation and shift schedules. On[employer] is a hospital-wide system for scheduling doctors and paging doctors on call. EasyPlot is for scientific plotting and data analysis.  [email or web address]  and use [place]'s universal password to access. If you do not have the password, please contact the hospital operator.  Locate the [facility] provider you are looking for under [facility] and page to a number that you can be directly reached. If you still have difficulty reaching the provider, please page the [facility] (Director on Call) for the Hospitalists listed on amion for assistance.  06/26/2021, 10:27 PM

## 2021-06-27 ENCOUNTER — Inpatient Hospital Stay (HOSPITAL_COMMUNITY): Payer: Medicaid Other

## 2021-06-27 LAB — COMPREHENSIVE METABOLIC PANEL
ALT: 30 U/L (ref 0–44)
AST: 37 U/L (ref 15–41)
Albumin: 3.2 g/dL — ABNORMAL LOW (ref 3.5–5.0)
Alkaline Phosphatase: 45 U/L (ref 38–126)
Anion gap: 13 (ref 5–15)
BUN: 11 mg/dL (ref 6–20)
CO2: 24 mmol/L (ref 22–32)
Calcium: 8.8 mg/dL — ABNORMAL LOW (ref 8.9–10.3)
Chloride: 100 mmol/L (ref 98–111)
Creatinine, Ser: 0.78 mg/dL (ref 0.61–1.24)
GFR, Estimated: >60 mL/min (ref >60)
Glucose, Bld: 232 mg/dL — ABNORMAL HIGH (ref 70–99)
Potassium: 3.4 mmol/L — ABNORMAL LOW (ref 3.5–5.1)
Sodium: 137 mmol/L (ref 135–145)
Total Bilirubin: 0.9 mg/dL (ref 0.3–1.2)
Total Protein: 6.5 g/dL (ref 6.5–8.1)

## 2021-06-27 LAB — HEPARIN LEVEL (UNFRACTIONATED)
Heparin Unfractionated: <0.1 IU/mL — ABNORMAL LOW (ref 0.30–0.70)
Heparin Unfractionated: 0.3 IU/mL (ref 0.30–0.70)
Heparin Unfractionated: 0.16 IU/mL — ABNORMAL LOW (ref 0.30–0.70)

## 2021-06-27 LAB — GLUCOSE, CAPILLARY
Glucose-Capillary: 272 mg/dL — ABNORMAL HIGH (ref 70–99)
Glucose-Capillary: 252 mg/dL — ABNORMAL HIGH (ref 70–99)
Glucose-Capillary: 252 mg/dL — ABNORMAL HIGH (ref 70–99)
Glucose-Capillary: 286 mg/dL — ABNORMAL HIGH (ref 70–99)

## 2021-06-27 LAB — CBC
HCT: 37 % — ABNORMAL LOW (ref 39.0–52.0)
Hemoglobin: 12.2 g/dL — ABNORMAL LOW (ref 13.0–17.0)
MCH: 31.1 pg (ref 26.0–34.0)
MCHC: 33 g/dL (ref 30.0–36.0)
MCV: 94.4 fL (ref 80.0–100.0)
Platelets: 186 10*3/uL (ref 150–400)
RBC: 3.92 MIL/uL — ABNORMAL LOW (ref 4.22–5.81)
RDW: 13.6 % (ref 11.5–15.5)
WBC: 9.4 10*3/uL (ref 4.0–10.5)
nRBC: 0 % (ref 0.0–0.2)

## 2021-06-27 LAB — C-REACTIVE PROTEIN
CRP: 3.5 mg/dL — ABNORMAL HIGH (ref <1.0)
CRP: 5.8 mg/dL — ABNORMAL HIGH (ref <1.0)

## 2021-06-27 MED ORDER — CARVEDILOL 12.5 MG PO TABS
12.5000 mg | ORAL_TABLET | Freq: Two times a day (BID) | ORAL | Status: DC
  Administered 2021-06-27 – 2021-06-29 (×4): 12.5 mg via ORAL
  Filled 2021-06-27 (×4): qty 1

## 2021-06-27 MED ORDER — IPRATROPIUM-ALBUTEROL 20-100 MCG/ACT IN AERS
1.0000 | INHALATION_SPRAY | Freq: Two times a day (BID) | RESPIRATORY_TRACT | Status: DC
  Administered 2021-06-28 – 2021-06-29 (×2): 1 via RESPIRATORY_TRACT

## 2021-06-27 MED ORDER — HYDROCERIN EX CREA
TOPICAL_CREAM | Freq: Every day | CUTANEOUS | Status: DC
  Filled 2021-06-27 (×2): qty 113

## 2021-06-27 MED ORDER — HEPARIN BOLUS VIA INFUSION
3000.0000 [IU] | Freq: Once | INTRAVENOUS | Status: AC
  Administered 2021-06-27: 3000 [IU] via INTRAVENOUS
  Filled 2021-06-27: qty 3000

## 2021-06-27 MED ORDER — ASCORBIC ACID 500 MG PO TABS
500.0000 mg | ORAL_TABLET | Freq: Every day | ORAL | Status: DC
  Administered 2021-06-27 – 2021-06-29 (×3): 500 mg via ORAL
  Filled 2021-06-27 (×3): qty 1

## 2021-06-27 MED ORDER — FUROSEMIDE 40 MG PO TABS
40.0000 mg | ORAL_TABLET | Freq: Every day | ORAL | Status: DC
  Administered 2021-06-27 – 2021-06-28 (×2): 40 mg via ORAL
  Filled 2021-06-27 (×2): qty 1

## 2021-06-27 MED ORDER — IPRATROPIUM-ALBUTEROL 20-100 MCG/ACT IN AERS
1.0000 | INHALATION_SPRAY | Freq: Four times a day (QID) | RESPIRATORY_TRACT | Status: DC
  Administered 2021-06-27 (×2): 1 via RESPIRATORY_TRACT
  Filled 2021-06-27: qty 4

## 2021-06-27 MED ORDER — ZINC SULFATE 220 (50 ZN) MG PO CAPS
220.0000 mg | ORAL_CAPSULE | Freq: Every day | ORAL | Status: DC
  Administered 2021-06-27 – 2021-06-29 (×3): 220 mg via ORAL
  Filled 2021-06-27 (×3): qty 1

## 2021-06-27 NOTE — Progress Notes (Signed)
VASCULAR LAB     Patient states he is too short of breath to get in bed to have his venous duplex done.  This study cannot be done with the patient in a recliner or in a sitting position.  I informed the patient that we would try again tomorrow, 06/28/21, to see if his breathing is better.   Reanna Scoggin, RVT 06/27/2021, 12:22 PM

## 2021-06-27 NOTE — Progress Notes (Signed)
ANTICOAGULATION CONSULT NOTE  Pharmacy Consult for heparin Indication: pulmonary embolus  Allergies  Allergen Reactions   Coconut Flavor [Flavoring Agent] Hives   Coconut Oil Hives   Ibuprofen Other (See Comments)    Made gastric ulcers worse   Aleve [Naproxen] Other (See Comments)    DUE TO KIDNEYS   Nsaids Other (See Comments)    Stomach ulcers    Vascepa [Icosapent Ethyl] Other (See Comments)    headaches, chest pain, similar to sx of a stroke, hypotension     Patient Measurements: Height: '5\' 10"'$  (177.8 cm) Weight: (!) 148 kg (326 lb 4.5 oz) IBW/kg (Calculated) : 73 Heparin Dosing Weight: 108.3 kg  Vital Signs: Temp: 97.6 F (36.4 C) (09/04 0902) Temp Source: Oral (09/04 0902) BP: 130/81 (09/04 0902) Pulse Rate: 94 (09/04 0902)  Labs: Recent Labs    06/26/21 1139 06/26/21 2001 06/27/21 0142 06/27/21 1037  HGB 12.9*  --  12.2*  --   HCT 39.1  --  37.0*  --   PLT 209  --  186  --   APTT  --  137*  --   --   LABPROT  --  14.8  --   --   INR  --  1.2  --   --   HEPARINUNFRC  --   --  <0.10* 0.30  CREATININE 0.84  --  0.78  --   TROPONINIHS 167* 165*  --   --      Estimated Creatinine Clearance: 148.4 mL/min (by C-G formula based on SCr of 0.78 mg/dL).   Assessment:  60 yo male presented with worsening SOB and found to have PE on CTA in the ED yesterday (9/3) with elevated RV:LV ratio of 1.08, moderate clot buirden and evidence of right heart strain. Past medical history significant for CHF, DVT (no AC), recent COVID 19 dx.  Heparin level barely therapeutic at 0.30 on 2350 units/hr. CBC stable, no signs of bleeding or infusion problems.  Goal of Therapy:  Heparin level 0.3-0.7 units/ml Monitor platelets by anticoagulation protocol: Yes   Plan:  Increase heparin infusion slightly to 2450 units/hr 6 hour heparin level Daily heparin level and CBC Monitor for s/sx bleeding  Laurey Arrow, PharmD PGY1 Pharmacy Resident 06/27/2021  11:49 AM  Please check  [email or web address] for unit-specific pharmacy phone numbers.

## 2021-06-27 NOTE — Progress Notes (Signed)
PROGRESS NOTE    [name]  [id number] DOB: [birth year] DOA: 06/26/2021 PCP: [name], FNP    Brief Narrative:  [name] is a 60 year old male with past medical history significant for type 2 diabetes mellitus, essential hypertension, history of DVT/PE currently not on anticoagulation, OSA not on CPAP, chronic diastolic congestive heart failure and morbid obesity who presented to [facility] ED on 9/3 from urgent care clinic with progressive shortness of breath, respiratory distress and hypoxia.   Patient also endorses cough, fever, shortness of breath and body aches.  Patient has been vaccinated for Cova-19 with booster.  In the ED, temperature 98.5 F, HR 112, RR 18, BP 113/54, SPO2 83% on room air, improved to 95% on 3 L nasal cannula.  Sodium 138, potassium 3.7, chloride 98, CO2 28, glucose 227, BUN 13, creatinine 0.84.  BNP 55.4.  High-sensitivity troponin 167> 165.  WBC 10.5, hemoglobin 12.9, platelets 209.  Chest x-ray with stable cardiomegaly with vascular congestion, mild basilar atelectasis, no consolidation.  EKG with sinus tachycardia, rate 114, QTc 441, no concerning dynamic changes.  CTA chest positive for PE with moderate clot burden and evidence of right heart strain, patchy GGO right upper lobe, chronic dilatation main pulmonary artery consistent with PAH.  Procalcitonin less than 0.10.  Patient was started on heparin drip.  Hospitalist service consulted for further evaluation and management of acute hypoxic respiratory failure secondary to pulmonary embolism in the setting of recent COVID-19 viral infection.   Assessment & Plan:   Principal Problem:   Acute pulmonary embolism (HCC) Active Problems:   HTN (hypertension)   Recurrent deep vein thrombosis (DVT) (HCC)   Type 2 diabetes mellitus with vascular disease (HCC)   Chronic diastolic CHF (congestive heart failure) (HCC)   Diabetic ulcer of lower leg (HCC)   Acute respiratory failure with hypoxia (HCC)    COVID-19 virus infection   Acute hypoxic respiratory failure, POA Acute pulmonary embolism, recurrent Patient presenting to the ED from urgent care after being found hypoxic, tachycardic, tachypneic in mild respiratory distress.  CT angiogram chest with pulmonary embolism distal right main pulmonary artery extending into the upper/middle/lower lobar branches with subtotally occlusion with RV strain.  Patient with history of previous PE/DVT 8 years ago and completed anticoagulation course.  Denies any family history of hypercoagulable state and denies any recent travel or trauma. --Heparin drip; anticipate transition to DOAC 48 hours --TTE: Pending --Vascular duplex ultrasound bilateral lower extremities: Pending --Supplemental oxygen, maintain SPO2 greater than 92%, currently on 3 L nasal cannula --Continue monitor on telemetry  COVID-19 viral infection Patient reports recent diagnosis 11 days prior.  Has been vaccinated with booster.  Imaging with mild GGO right upper lobe, otherwise unrevealing without consolidation.  Etiology of hypoxia likely related to his pulmonary embolism with subocclusive disease as above. --Remdesivir Day #2/5 -- Avoiding steroids given poorly controlled diabetes, hypoxia likely related to PE as above rather than COVID infection. --Combivent MDI q6h --Zinc/vitamin C --Continue supplemental oxygen as above --Continue airborne/contact isolation  Essential hypertension BP 136/76, fairly well controlled.  On carvedilol 12.5 mg p.o. twice daily, isosorbide mononitrate 30 mg p.o. daily, lisinopril 5 mg p.o. daily, furosemide 40 mg TID at home. --Restart carvedilol 12.5 mg BID --Furosemide at reduced dose '40mg'$  daily --Continue to hold lisinopril/Imdur for now --Continue aspirin and statin --Continue monitor BP closely  Dyslipidemia --Atorvastatin 40 mg p.o. daily --Fenofibrate 160 mg p.o. daily  Type 2 diabetes mellitus Hemoglobin A1c 10.8 10/15/2020, poorly  controlled.  Home regimen includes Humulin R 70-90 units 3 times daily, glipizide 10 mg twice daily. --Humulin R 50u TIDAC --Glipizide 10 mg p.o. twice daily --Resistant SSI for further coverage --CBGs qAC/HS  Chronic diastolic congestive heart failure TTE 10/15/2020 with LVEF 55-60%, IVC dilated. --Repeating TTE as above. --Restarting furosemide at reduced dose 40 mg p.o. daily today --Strict I's and O's and daily weights  OSA Chronically dilated pulmonary artery on CTA, likely secondary to his underlying OSA/PAH.  Noncompliance with CPAP outpatient.  Left lower extremity ulceration malleolus, POA --Evaluated by wound care --Cleanse with soap and water, Eucerin cream covered with antimicrobial nonadherent Xeroform gauze with ABD pad and secured with Kerlix roll  GERD: Continue PPI  Depression: Sertraline 50 mg p.o. daily  Hypokalemia Potassium 3.4, rebleeding. --Repeat electrolytes include magnesium in the a.m.  Morbid obesity Body mass index is 46.82 kg/m.  Discussed with patient needs for aggressive lifestyle changes/weight loss as this complicates all facets of care.  Outpatient follow-up with PCP.  May benefit from bariatric evaluation outpatient.   DVT prophylaxis: Heparin drip   Code Status: Full Code Family Communication: No family present at bedside this morning  Disposition Plan:  Level of care: Progressive Status is: Inpatient  Remains inpatient appropriate because:Ongoing diagnostic testing needed not appropriate for outpatient work up, Unsafe d/c plan, IV treatments appropriate due to intensity of illness or inability to take PO, and Inpatient level of care appropriate due to severity of illness  Dispo: The patient is from: Home              Anticipated d/c is to: Home              Patient currently is not medically stable to d/c.   Difficult to place patient No   Consultants:  None  Procedures:  TTE: Pending Vascular duplex ultrasound bilateral lower  extremities: Pending  Antimicrobials:  Remdesivir 9/3   Subjective: Patient seen examined at bedside, resting comfortably.  Sitting in bedside chair.  Continues with shortness of breath worse with deep inspiration.  Remains on 3 L nasal cannula, not oxygen dependent at baseline.  On heparin drip.  Pending TTE/vascular duplex ultrasound.  Patient states previous history of DVT/PE, finished therapy 8 years ago.  Denies history of recent travel or trauma.  No family history of hypercoagulable state.  No other questions or concerns at this time.  Denies headache, no fever/chills/night sweats, no nausea cefonicid diarrhea, no chest pain, no palpitations, no abdominal pain, no weakness, no fatigue, no paresthesias.  No acute events overnight per nursing staff.  Objective: Vitals:   06/26/21 2315 06/27/21 0616 06/27/21 0902 06/27/21 1251  BP: 139/79 136/76 130/81 122/80  Pulse: 96 98 94 93  Resp: (!) '24 20 20 16  '$ Temp: 98.1 F (36.7 C) 97.6 F (36.4 C) 97.6 F (36.4 C) 97.9 F (36.6 C)  TempSrc: Oral Oral Oral Oral  SpO2: 97% 96% 95% 94%  Weight:      Height:        Intake/Output Summary (Last 24 hours) at 06/27/2021 1353 Last data filed at 06/27/2021 0900 Gross per 24 hour  Intake 838 ml  Output --  Net 838 ml   Filed Weights   06/26/21 1900  Weight: (!) 148 kg    Examination:  General exam: Appears calm and comfortable, obese Respiratory system: Clear to auscultation. Respiratory effort normal.  On 3 L nasal cannula with SPO2 96% at rest Cardiovascular system: S1 & S2 heard, RRR.  No JVD, murmurs, rubs, gallops or clicks. 2-3+ pedal edema to mid shin. Gastrointestinal system: Abdomen is nondistended, soft and nontender. No organomegaly or masses felt. Normal bowel sounds heard. Central nervous system: Alert and oriented. No focal neurological deficits. Extremities: Symmetric 5 x 5 power. Skin: Bilateral lower extremities with chronic venous changes, left lower extremity with  ulceration malleolar region Psychiatry: Judgement and insight appear poor. Mood & affect appropriate.     Data Reviewed: I have personally reviewed following labs and imaging studies  CBC: Recent Labs  Lab 06/26/21 1139 06/27/21 0142  WBC 10.5 9.4  HGB 12.9* 12.2*  HCT 39.1 37.0*  MCV 95.1 94.4  PLT 209 [id number]   Basic Metabolic Panel: Recent Labs  Lab 06/26/21 1139 06/27/21 0142  NA 138 137  K 3.7 3.4*  CL 98 100  CO2 28 24  GLUCOSE 227* 232*  BUN 13 11  CREATININE 0.84 0.78  CALCIUM 9.3 8.8*   GFR: Estimated Creatinine Clearance: 148.4 mL/min (by C-G formula based on SCr of 0.78 mg/dL). Liver Function Tests: Recent Labs  Lab 06/27/21 0142  AST 37  ALT 30  ALKPHOS 45  BILITOT 0.9  PROT 6.5  ALBUMIN 3.2*   No results for input(s): LIPASE, AMYLASE in the last 168 hours. No results for input(s): AMMONIA in the last 168 hours. Coagulation Profile: Recent Labs  Lab 06/26/21 2001  INR 1.2   Cardiac Enzymes: No results for input(s): CKTOTAL, CKMB, CKMBINDEX, TROPONINI in the last 168 hours. BNP (last 3 results) No results for input(s): PROBNP in the last 8760 hours. HbA1C: No results for input(s): HGBA1C in the last 72 hours. CBG: Recent Labs  Lab 06/26/21 1556 06/26/21 2149 06/27/21 0618 06/27/21 1254  GLUCAP 251* 262* 272* 252*   Lipid Profile: No results for input(s): CHOL, HDL, LDLCALC, TRIG, CHOLHDL, LDLDIRECT in the last 72 hours. Thyroid Function Tests: No results for input(s): TSH, T4TOTAL, FREET4, T3FREE, THYROIDAB in the last 72 hours. Anemia Panel: No results for input(s): VITAMINB12, FOLATE, FERRITIN, TIBC, IRON, RETICCTPCT in the last 72 hours. Sepsis Labs: Recent Labs  Lab 06/26/21 1943  PROCALCITON <0.10    No results found for this or any previous visit (from the past 240 hour(s)).       Radiology Studies: DG Chest 2 View  Result Date: 06/26/2021 CLINICAL DATA:  Chest pain and shortness of breath EXAM: CHEST - 2 VIEW  COMPARISON:  06/15/2021 FINDINGS: Stable cardiomegaly with vascular congestion. Minor basilar atelectasis. Negative for pneumonia, CHF, effusion or pneumothorax. Trachea midline. Degenerative changes of the spine. Lateral view is limited with motion artifact. No severe compression fracture. IMPRESSION: Stable chest exam. No interval change or acute process by plain radiography. Electronically Signed   By: Jerilynn Mages.  Shick M.D.   On: 06/26/2021 12:19   CT Angio Chest PE W and/or Wo Contrast  Result Date: 06/26/2021 CLINICAL DATA:  Shortness of breath. High probability for pulmonary embolus. EXAM: CT ANGIOGRAPHY CHEST WITH CONTRAST TECHNIQUE: Multidetector CT imaging of the chest was performed using the standard protocol during bolus administration of intravenous contrast. Multiplanar CT image reconstructions and MIPs were obtained to evaluate the vascular anatomy. CONTRAST:  60m OMNIPAQUE IOHEXOL 350 MG/ML SOLN COMPARISON:  Radiograph earlier today.  Chest CTA 07/31/2019 FINDINGS: Cardiovascular: Positive for pulmonary embolus with filling defects involving the distal right main pulmonary artery extending into the upper, middle, and lower lobar branches. Thrombus is subtotally occlusive. Segmental filling defects in the left lower lobe pulmonary artery, and subsegmental left upper  lobe pulmonary arterial filling defects. There is dilatation of the main pulmonary artery at 4.8 cm, chronic. Elevated RV to LV ratio of 1.08. Heart size upper normal. There is no pericardial effusion. Coronary artery calcification or stent. No aortic dissection. Mediastinum/Nodes: No enlarged mediastinal or hilar lymph nodes. Patulous esophagus without wall thickening. No thyroid nodule. Lungs/Pleura: Patchy ground-glass opacity in the right upper lobe, slightly perifissural but not peripheral or wedge-shaped. Minor dependent atelectasis in the lower lobes. No pleural fluid. No pulmonary mass. Upper Abdomen: Enlarged liver with steatosis. No  acute upper abdominal findings. Musculoskeletal: Diffuse thoracic spondylosis with endplate spurring and degenerative disc disease. No acute osseous abnormalities are seen. Review of the MIP images confirms the above findings. IMPRESSION: 1. Positive for pulmonary embolus with moderate clot burden and evidence of right heart strain, RV to LV ratio of 1.08. 2. Patchy ground-glass opacity in the right upper lobe, slightly perifissural but not peripheral or wedge-shaped. Favor pneumonia over pulmonary infarct. 3. Chronic dilatation of the main pulmonary artery consistent with pulmonary arterial hypertension. 4. Coronary artery calcification or stent. 5. Incidental hepatomegaly and steatosis in the upper abdomen. Critical Value/emergent results were called by telephone at the time of interpretation on 06/26/2021 at 6:58 pm to provider [facility] , who verbally acknowledged these results. Electronically Signed   By: Keith Rake M.D.   On: 06/26/2021 18:58        Scheduled Meds:  aspirin  81 mg Oral Daily   atorvastatin  40 mg Oral QHS   famotidine  20 mg Oral Daily   fenofibrate  160 mg Oral Daily   glipiZIDE  10 mg Oral BID   hydrocerin   Topical Daily   insulin aspart  0-20 Units Subcutaneous TID WC   insulin regular human CONCENTRATED  50 Units Subcutaneous TID WC   pantoprazole  40 mg Oral Daily   potassium chloride  20 mEq Oral BID   sertraline  50 mg Oral Daily   traZODone  50 mg Oral QHS   Continuous Infusions:  heparin 2,450 Units/hr (06/27/21 1257)   remdesivir 100 mg in NS 100 mL       LOS: 1 day    Time spent: 39 minutes spent on chart review, discussion with nursing staff, consultants, updating family and interview/physical exam; more than 50% of that time was spent in counseling and/or coordination of care.    Shyheem Whitham J [place] DO [facility] Available via Epic secure chat 7am-7pm After these hours, please refer to coverage provider listed on  [email or web address] 06/27/2021, 1:53 PM

## 2021-06-27 NOTE — Consult Note (Signed)
WOC Nurse Consult Note: Reason for Consult:Left LE with partial thickness ulcerations at malleolus. Bilateral LEs with dry skin, edema. Patient is to have seen outpatient Wound Care later this month. See photos taken upon admission in the EMR. Wound type:Venous insufficiency with ulceration, Clinical CEAP 6 Pressure Injury POA: N/A Measurement: Lower left LE with scattered partial thickness ulcerations in the malleolar region. Wound [id number], dry Drainage (amount, consistency, odor) none Periwound:dry, flaking, edematous Dressing procedure/placement/frequency: I have provided Nursing with guidance for the care of the bilateral LEs using a soap and water cleanse, rinse and pat dry (thoroughly drying between toes) followed by application of Eucerin cream to the LEs (but not between the toes). The Left LE ulcerations will be covered with an antimicrobial nonadherent (xeroform) topped with an ABD pad and secured with a few turns of Kerlix roll gauze/paper tape. Both feet are to be placed into Prevalon boots while in bed for pressure injury prevention. A sacral prophylactic foam bordered dressing is to be placed for PI prevention and turning and repositioning per house protocol is in place.  Patient to keep or reschedule the scheduled appointment at the outpatient wound care center as a discussion regarding and provision of compression hosiery when appropriate will occur there.  Oversight and continuation of wound and skin POC is indicated at that site.  [facility] nursing team will not follow, but will remain available to this patient, the nursing and medical teams.  Please re-consult if needed. Thanks, Maudie Flakes, MSN, RN, [place], Arther Abbott  Pager# [phone]

## 2021-06-27 NOTE — Progress Notes (Signed)
Inpatient Diabetes Program Recommendations  AACE/ADA: New Consensus Statement on Inpatient Glycemic Control (2015)  Target Ranges:  Prepandial:   less than 140 mg/dL      Peak postprandial:   less than 180 mg/dL (1-2 hours)      Critically ill patients:  140 - 180 mg/dL   Lab Results  Component Value Date   GLUCAP 272 (H) 06/27/2021   HGBA1C 10.8 (H) 10/15/2020    Review of Glycemic Control Results for [name], [name] (MRN [id number]) as of 06/27/2021 08:33  Ref. Range 06/26/2021 15:56 06/26/2021 21:49 06/27/2021 06:18  Glucose-Capillary Latest Ref Range: 70 - 99 mg/dL 251 (H) 262 (H) 272 (H)   Diabetes history: DM 2 Outpatient Diabetes medications: Humulin R U-500 concentrated insulin 70-90 units tid, Glipizide 10 mg bid Current orders for Inpatient glycemic control:  Humulin R U-500 concentrated insulin 50 units tid Novolog 0-20 units tid Glipizide 10 mg bid  Inpatient Diabetes Program Recommendations:    - agree with current orders. Will follow glucose trends wile here.   A1c in process.  Thanks,  Tama Headings RN, MSN, BC-ADM Inpatient Diabetes Coordinator Team Pager [phone] (8a-5p)

## 2021-06-27 NOTE — Progress Notes (Signed)
ANTICOAGULATION CONSULT NOTE  Pharmacy Consult for heparin Indication: pulmonary embolus  Allergies  Allergen Reactions   Coconut Flavor [Flavoring Agent] Hives   Coconut Oil Hives   Ibuprofen Other (See Comments)    Made gastric ulcers worse   Aleve [Naproxen] Other (See Comments)    DUE TO KIDNEYS   Nsaids Other (See Comments)    Stomach ulcers    Vascepa [Icosapent Ethyl] Other (See Comments)    headaches, chest pain, similar to sx of a stroke, hypotension     Patient Measurements: Height: '5\' 10"'$  (177.8 cm) Weight: (!) 148 kg (326 lb 4.5 oz) IBW/kg (Calculated) : 73 Heparin Dosing Weight: 108.3 kg  Vital Signs: BP: 133/86 (09/03 2215) Pulse Rate: 100 (09/03 2215)  Labs: Recent Labs    06/26/21 1139 06/26/21 2001 06/27/21 0142  HGB 12.9*  --  12.2*  HCT 39.1  --  37.0*  PLT 209  --  186  APTT  --  137*  --   LABPROT  --  14.8  --   INR  --  1.2  --   HEPARINUNFRC  --   --  <0.10*  CREATININE 0.84  --  0.78  TROPONINIHS 167* 165*  --      Estimated Creatinine Clearance: 148.4 mL/min (by C-G formula based on SCr of 0.78 mg/dL).   Assessment: 60 yo M with PMHx of CHF, DVT (no AC), recent COVID 19 dx. Found to have PE on CTA in ED today, RV to LV ratio of 1.08, moderate clot burden, and evidence of RHS.   Heparin level undetectable on gtt at 1950 units/hr. No issues with line or bleeding reported per RN.  Goal of Therapy:  Heparin level 0.3-0.7 units/ml Monitor platelets by anticoagulation protocol: Yes   Plan:  Rebolus heparin 3000 units now Increase heparin infusion to 2350 units/hr Will f/u 6 hr heparin level  Sherlon Handing, PharmD, BCPS Please see amion for complete clinical pharmacist phone list 06/27/2021 3:10 AM

## 2021-06-27 NOTE — Progress Notes (Signed)
ANTICOAGULATION CONSULT NOTE  Pharmacy Consult for heparin Indication: pulmonary embolus  Allergies  Allergen Reactions   Coconut Flavor [Flavoring Agent] Hives   Coconut Oil Hives   Ibuprofen Other (See Comments)    Made gastric ulcers worse   Aleve [Naproxen] Other (See Comments)    DUE TO KIDNEYS   Nsaids Other (See Comments)    Stomach ulcers    Vascepa [Icosapent Ethyl] Other (See Comments)    headaches, chest pain, similar to sx of a stroke, hypotension     Patient Measurements: Height: '5\' 10"'$  (177.8 cm) Weight: (!) 148 kg (326 lb 4.5 oz) IBW/kg (Calculated) : 73 Heparin Dosing Weight: 108.3 kg  Vital Signs: Temp: 97.8 F (36.6 C) (09/04 1958) Temp Source: Oral (09/04 1958) BP: 143/57 (09/04 1958) Pulse Rate: 100 (09/04 1958)  Labs: Recent Labs    06/26/21 1139 06/26/21 2001 06/27/21 0142 06/27/21 1037 06/27/21 1858  HGB 12.9*  --  12.2*  --   --   HCT 39.1  --  37.0*  --   --   PLT 209  --  186  --   --   APTT  --  137*  --   --   --   LABPROT  --  14.8  --   --   --   INR  --  1.2  --   --   --   HEPARINUNFRC  --   --  <0.10* 0.30 0.16*  CREATININE 0.84  --  0.78  --   --   TROPONINIHS 167* 165*  --   --   --     Estimated Creatinine Clearance: 148.4 mL/min (by C-G formula based on SCr of 0.78 mg/dL).   Assessment:  60 yo male presented with worsening SOB and found to have PE on CTA in the ED yesterday (9/3) with elevated RV:LV ratio of 1.08, moderate clot buirden and evidence of right heart strain. Past medical history significant for CHF, DVT (no AC), recent COVID 19 dx.  Hep level returned at 0.16, however after speaking to RN, discovered that heparin gtt was turned off at 1819 to infuse another medication. Heparin level drawn at 1858, while heparin gtt still off. Heparin level resumed at 1900 per report from nightshift RN.   Goal of Therapy:  Heparin level 0.3-0.7 units/ml Monitor platelets by anticoagulation protocol: Yes   Plan: Continue  heparin infusion at 2450 units/hr since level is not clinically relevant due to heparin being off.  -F/u on 6 hour heparin level -Daily heparin level and CBC -Monitor for s/sx bleeding  Joetta Manners, PharmD, [facility] Emergency Medicine Clinical Pharmacist ED RPh Phone: [place]: [phone]

## 2021-06-28 ENCOUNTER — Other Ambulatory Visit (HOSPITAL_COMMUNITY): Payer: Medicaid Other

## 2021-06-28 ENCOUNTER — Inpatient Hospital Stay (HOSPITAL_COMMUNITY): Payer: Medicaid Other

## 2021-06-28 DIAGNOSIS — I2699 Other pulmonary embolism without acute cor pulmonale: Secondary | ICD-10-CM

## 2021-06-28 DIAGNOSIS — R0602 Shortness of breath: Secondary | ICD-10-CM

## 2021-06-28 LAB — CBC
HCT: 36.4 % — ABNORMAL LOW (ref 39.0–52.0)
Hemoglobin: 12.1 g/dL — ABNORMAL LOW (ref 13.0–17.0)
MCH: 31.3 pg (ref 26.0–34.0)
MCHC: 33.2 g/dL (ref 30.0–36.0)
MCV: 94.3 fL (ref 80.0–100.0)
Platelets: 187 10*3/uL (ref 150–400)
RBC: 3.86 MIL/uL — ABNORMAL LOW (ref 4.22–5.81)
RDW: 13.7 % (ref 11.5–15.5)
WBC: 10.1 10*3/uL (ref 4.0–10.5)
nRBC: 0 % (ref 0.0–0.2)

## 2021-06-28 LAB — COMPREHENSIVE METABOLIC PANEL
ALT: 28 U/L (ref 0–44)
AST: 28 U/L (ref 15–41)
Albumin: 3.3 g/dL — ABNORMAL LOW (ref 3.5–5.0)
Alkaline Phosphatase: 45 U/L (ref 38–126)
Anion gap: 11 (ref 5–15)
BUN: 8 mg/dL (ref 6–20)
CO2: 24 mmol/L (ref 22–32)
Calcium: 8.6 mg/dL — ABNORMAL LOW (ref 8.9–10.3)
Chloride: 101 mmol/L (ref 98–111)
Creatinine, Ser: 0.76 mg/dL (ref 0.61–1.24)
GFR, Estimated: >60 mL/min (ref >60)
Glucose, Bld: 247 mg/dL — ABNORMAL HIGH (ref 70–99)
Potassium: 3.5 mmol/L (ref 3.5–5.1)
Sodium: 136 mmol/L (ref 135–145)
Total Bilirubin: 0.9 mg/dL (ref 0.3–1.2)
Total Protein: 6.6 g/dL (ref 6.5–8.1)

## 2021-06-28 LAB — ECHOCARDIOGRAM LIMITED
Area-P 1/2: 3.99 cm2
Height: 70 in
S' Lateral: 3 cm
Weight: 5502.68 oz

## 2021-06-28 LAB — MAGNESIUM: Magnesium: 1.8 mg/dL (ref 1.7–2.4)

## 2021-06-28 LAB — GLUCOSE, CAPILLARY
Glucose-Capillary: 208 mg/dL — ABNORMAL HIGH (ref 70–99)
Glucose-Capillary: 307 mg/dL — ABNORMAL HIGH (ref 70–99)
Glucose-Capillary: 261 mg/dL — ABNORMAL HIGH (ref 70–99)
Glucose-Capillary: 231 mg/dL — ABNORMAL HIGH (ref 70–99)

## 2021-06-28 LAB — HEPARIN LEVEL (UNFRACTIONATED)
Heparin Unfractionated: 0.34 IU/mL (ref 0.30–0.70)
Heparin Unfractionated: 0.48 IU/mL (ref 0.30–0.70)

## 2021-06-28 MED ORDER — SIMETHICONE 80 MG PO CHEW
80.0000 mg | CHEWABLE_TABLET | Freq: Four times a day (QID) | ORAL | Status: DC | PRN
  Administered 2021-06-28: 80 mg via ORAL
  Filled 2021-06-28: qty 1

## 2021-06-28 MED ORDER — PERFLUTREN LIPID MICROSPHERE
1.0000 mL | INTRAVENOUS | Status: AC | PRN
  Administered 2021-06-28: 3 mL via INTRAVENOUS
  Filled 2021-06-28: qty 10

## 2021-06-28 MED ORDER — GUAIFENESIN-DM 100-10 MG/5ML PO SYRP
5.0000 mL | ORAL_SOLUTION | ORAL | Status: DC | PRN
  Administered 2021-06-28: 5 mL via ORAL
  Filled 2021-06-28: qty 5

## 2021-06-28 MED ORDER — MAGNESIUM OXIDE -MG SUPPLEMENT 400 (240 MG) MG PO TABS
400.0000 mg | ORAL_TABLET | Freq: Two times a day (BID) | ORAL | Status: AC
  Administered 2021-06-28 (×2): 400 mg via ORAL
  Filled 2021-06-28 (×2): qty 1

## 2021-06-28 MED ORDER — FUROSEMIDE 40 MG PO TABS
40.0000 mg | ORAL_TABLET | Freq: Three times a day (TID) | ORAL | Status: DC
Start: 2021-06-28 — End: 2021-06-29
  Administered 2021-06-28 – 2021-06-29 (×3): 40 mg via ORAL
  Filled 2021-06-28 (×3): qty 1

## 2021-06-28 NOTE — Progress Notes (Signed)
Bilateral lower extremity venous duplex has been completed. Preliminary results can be found in CV Proc through chart review.   06/28/21 12:30 PM [name] RVT

## 2021-06-28 NOTE — Progress Notes (Signed)
PROGRESS NOTE    [name]  [id number] DOB: 1961/09/07 DOA: 06/26/2021 PCP: [name], FNP    Brief Narrative:  [name] is a 60 year old male with past medical history significant for type 2 diabetes mellitus, essential hypertension, history of DVT/PE currently not on anticoagulation, OSA not on CPAP, chronic diastolic congestive heart failure and morbid obesity who presented to [facility] ED on 9/3 from urgent care clinic with progressive shortness of breath, respiratory distress and hypoxia.   Patient also endorses cough, fever, shortness of breath and body aches.  Patient has been vaccinated for Cova-19 with booster.  In the ED, temperature 98.5 F, HR 112, RR 18, BP 113/54, SPO2 83% on room air, improved to 95% on 3 L nasal cannula.  Sodium 138, potassium 3.7, chloride 98, CO2 28, glucose 227, BUN 13, creatinine 0.84.  BNP 55.4.  High-sensitivity troponin 167> 165.  WBC 10.5, hemoglobin 12.9, platelets 209.  Chest x-ray with stable cardiomegaly with vascular congestion, mild basilar atelectasis, no consolidation.  EKG with sinus tachycardia, rate 114, QTc 441, no concerning dynamic changes.  CTA chest positive for PE with moderate clot burden and evidence of right heart strain, patchy GGO right upper lobe, chronic dilatation main pulmonary artery consistent with PAH.  Procalcitonin less than 0.10.  Patient was started on heparin drip.  Hospitalist service consulted for further evaluation and management of acute hypoxic respiratory failure secondary to pulmonary embolism in the setting of recent COVID-19 viral infection.   Assessment & Plan:   Principal Problem:   Acute pulmonary embolism (HCC) Active Problems:   HTN (hypertension)   Recurrent deep vein thrombosis (DVT) (HCC)   Type 2 diabetes mellitus with vascular disease (HCC)   Chronic diastolic CHF (congestive heart failure) (HCC)   Diabetic ulcer of lower leg (HCC)   Acute respiratory failure with hypoxia (HCC)    COVID-19 virus infection   Acute hypoxic respiratory failure, POA Acute pulmonary embolism, recurrent Patient presenting to the ED from urgent care after being found hypoxic, tachycardic, tachypneic in mild respiratory distress.  CT angiogram chest with pulmonary embolism distal right main pulmonary artery extending into the upper/middle/lower lobar branches with subtotally occlusion with RV strain.  Patient with history of previous PE/DVT 8 years ago and completed anticoagulation course.  Denies any family history of hypercoagulable state and denies any recent travel or trauma. --Heparin drip; transition to Eliquis 9/6 --TTE: Pending --Vascular duplex ultrasound bilateral lower extremities: Pending --Supplemental oxygen, maintain SPO2 greater than 92%, currently on 3 L nasal cannula with SPO2 93% at rest --Ambulatory O2 testing today --Continue monitor on telemetry  COVID-19 viral infection Patient reports recent diagnosis 11 days prior.  Has been vaccinated with booster.  Imaging with mild GGO right upper lobe, otherwise unrevealing without consolidation.  Etiology of hypoxia likely related to his pulmonary embolism with subocclusive disease as above. --Remdesivir Day #3/5 -- Avoiding steroids given poorly controlled diabetes, hypoxia likely related to PE as above rather than COVID infection. --Combivent MDI q6h --Zinc/vitamin C --Continue supplemental oxygen as above --Continue airborne/contact isolation  Essential hypertension BP 117/79, fairly well controlled.  On carvedilol 12.5 mg p.o. twice daily, isosorbide mononitrate 30 mg p.o. daily, lisinopril 5 mg p.o. daily, furosemide 40 mg TID at home. --Carvedilol 12.5 mg BID --Furosemide '40mg'$  TID --Continue to hold lisinopril/Imdur for now --Continue aspirin and statin --Continue monitor BP closely  Dyslipidemia --Atorvastatin 40 mg p.o. daily --Fenofibrate 160 mg p.o. daily  Type 2 diabetes mellitus Hemoglobin A1c 10.8  10/15/2020, poorly controlled.  Home regimen includes Humulin R 70-90 units 3 times daily, glipizide 10 mg twice daily. --Humulin R 50u TIDAC --Glipizide 10 mg p.o. twice daily --Resistant SSI for further coverage --CBGs qAC/HS  Chronic diastolic congestive heart failure TTE 10/15/2020 with LVEF 55-60%, IVC dilated. --Repeating TTE as above. --Increase furosemide back to home dose 40 mg p.o. TID today --Strict I's and O's and daily weights  OSA Chronically dilated pulmonary artery on CTA, likely secondary to his underlying OSA/PAH.  Noncompliance with CPAP outpatient.  Left lower extremity ulceration malleolus, POA --Evaluated by wound care --Cleanse with soap and water, Eucerin cream covered with antimicrobial nonadherent Xeroform gauze with ABD pad and secured with Kerlix roll  GERD: Continue PPI  Depression: Sertraline 50 mg p.o. daily  Hypokalemia Potassium 3.4, rebleeding. --Repeat electrolytes include magnesium in the a.m.  Morbid obesity Body mass index is 49.35 kg/m.  Discussed with patient needs for aggressive lifestyle changes/weight loss as this complicates all facets of care.  Outpatient follow-up with PCP.  May benefit from bariatric evaluation outpatient.   DVT prophylaxis: Heparin drip   Code Status: Full Code Family Communication: No family present at bedside this morning  Disposition Plan:  Level of care: Progressive Status is: Inpatient  Remains inpatient appropriate because:Ongoing diagnostic testing needed not appropriate for outpatient work up, Unsafe d/c plan, IV treatments appropriate due to intensity of illness or inability to take PO, and Inpatient level of care appropriate due to severity of illness  Dispo: The patient is from: Home              Anticipated d/c is to: Home              Patient currently is not medically stable to d/c.   Difficult to place patient No   Consultants:  None  Procedures:  TTE: Pending Vascular duplex  ultrasound bilateral lower extremities: Pending  Antimicrobials:  Remdesivir 9/3   Subjective: Patient seen examined at bedside, resting comfortably.  Sitting in bedside chair.  Continues with mild dyspnea, on 3 L nasal cannula at rest.  Pending TTE/vascular duplex ultrasound bilateral lower extremities.  Discussed transition to Eliquis tomorrow morning.  Will obtain ambulatory O2 screen today.  No other questions or concerns at this time. Denies headache, no fever/chills/night sweats, no nausea vomiting/diarrhea, no chest pain, no palpitations, no abdominal pain, no weakness, no fatigue, no paresthesias.  No acute events overnight per nursing staff.  Objective: Vitals:   06/28/21 0112 06/28/21 0448 06/28/21 0824 06/28/21 1112  BP: 105/65 117/79 (!) 155/91 133/87  Pulse: 89 86 100 97  Resp: '20 20 20 20  '$ Temp: 98 F (36.7 C) 98.3 F (36.8 C) 98.6 F (37 C) 98.2 F (36.8 C)  TempSrc: Oral Oral Oral Oral  SpO2: 92% 93% 93% 93%  Weight:  (!) 156 kg    Height:        Intake/Output Summary (Last 24 hours) at 06/28/2021 1141 Last data filed at 06/28/2021 [id number] Gross per 24 hour  Intake 1638.28 ml  Output 400 ml  Net 1238.28 ml   Filed Weights   06/26/21 1900 06/28/21 0448  Weight: (!) 148 kg (!) 156 kg    Examination:  General exam: Appears calm and comfortable, obese Respiratory system: Clear to auscultation. Respiratory effort normal.  On 3 L nasal cannula with SPO2 93% at rest Cardiovascular system: S1 & S2 heard, RRR. No JVD, murmurs, rubs, gallops or clicks. 2-3+ pedal edema to mid shin. Gastrointestinal system:  Abdomen is nondistended, soft and nontender. No organomegaly or masses felt. Normal bowel sounds heard. Central nervous system: Alert and oriented. No focal neurological deficits. Extremities: Symmetric 5 x 5 power. Skin: Bilateral lower extremities with chronic venous changes, left lower extremity with ulceration malleolar region Psychiatry: Judgement and insight  appear poor. Mood & affect appropriate.     Data Reviewed: I have personally reviewed following labs and imaging studies  CBC: Recent Labs  Lab 06/26/21 1139 06/27/21 0142 06/28/21 0045  WBC 10.5 9.4 10.1  HGB 12.9* 12.2* 12.1*  HCT 39.1 37.0* 36.4*  MCV 95.1 94.4 94.3  PLT 209 186 [id number]   Basic Metabolic Panel: Recent Labs  Lab 06/26/21 1139 06/27/21 0142 06/28/21 0045  NA 138 137 136  K 3.7 3.4* 3.5  CL 98 100 101  CO2 '28 24 24  '$ GLUCOSE 227* 232* 247*  BUN '13 11 8  '$ CREATININE 0.84 0.78 0.76  CALCIUM 9.3 8.8* 8.6*  MG  --   --  1.8   GFR: Estimated Creatinine Clearance: 153 mL/min (by C-G formula based on SCr of 0.76 mg/dL). Liver Function Tests: Recent Labs  Lab 06/27/21 0142 06/28/21 0045  AST 37 28  ALT 30 28  ALKPHOS 45 45  BILITOT 0.9 0.9  PROT 6.5 6.6  ALBUMIN 3.2* 3.3*   No results for input(s): LIPASE, AMYLASE in the last 168 hours. No results for input(s): AMMONIA in the last 168 hours. Coagulation Profile: Recent Labs  Lab 06/26/21 2001  INR 1.2   Cardiac Enzymes: No results for input(s): CKTOTAL, CKMB, CKMBINDEX, TROPONINI in the last 168 hours. BNP (last 3 results) No results for input(s): PROBNP in the last 8760 hours. HbA1C: No results for input(s): HGBA1C in the last 72 hours. CBG: Recent Labs  Lab 06/27/21 1254 06/27/21 1605 06/27/21 2000 06/28/21 0626 06/28/21 1117  GLUCAP 252* 252* 286* 208* 307*   Lipid Profile: No results for input(s): CHOL, HDL, LDLCALC, TRIG, CHOLHDL, LDLDIRECT in the last 72 hours. Thyroid Function Tests: No results for input(s): TSH, T4TOTAL, FREET4, T3FREE, THYROIDAB in the last 72 hours. Anemia Panel: No results for input(s): VITAMINB12, FOLATE, FERRITIN, TIBC, IRON, RETICCTPCT in the last 72 hours. Sepsis Labs: Recent Labs  Lab 06/26/21 1943  PROCALCITON <0.10    No results found for this or any previous visit (from the past 240 hour(s)).       Radiology Studies: DG Chest 2  View  Result Date: 06/26/2021 CLINICAL DATA:  Chest pain and shortness of breath EXAM: CHEST - 2 VIEW COMPARISON:  06/15/2021 FINDINGS: Stable cardiomegaly with vascular congestion. Minor basilar atelectasis. Negative for pneumonia, CHF, effusion or pneumothorax. Trachea midline. Degenerative changes of the spine. Lateral view is limited with motion artifact. No severe compression fracture. IMPRESSION: Stable chest exam. No interval change or acute process by plain radiography. Electronically Signed   By: Jerilynn Mages.  Shick M.D.   On: 06/26/2021 12:19   CT Angio Chest PE W and/or Wo Contrast  Result Date: 06/26/2021 CLINICAL DATA:  Shortness of breath. High probability for pulmonary embolus. EXAM: CT ANGIOGRAPHY CHEST WITH CONTRAST TECHNIQUE: Multidetector CT imaging of the chest was performed using the standard protocol during bolus administration of intravenous contrast. Multiplanar CT image reconstructions and MIPs were obtained to evaluate the vascular anatomy. CONTRAST:  52m OMNIPAQUE IOHEXOL 350 MG/ML SOLN COMPARISON:  Radiograph earlier today.  Chest CTA 07/31/2019 FINDINGS: Cardiovascular: Positive for pulmonary embolus with filling defects involving the distal right main pulmonary artery extending into the upper, middle,  and lower lobar branches. Thrombus is subtotally occlusive. Segmental filling defects in the left lower lobe pulmonary artery, and subsegmental left upper lobe pulmonary arterial filling defects. There is dilatation of the main pulmonary artery at 4.8 cm, chronic. Elevated RV to LV ratio of 1.08. Heart size upper normal. There is no pericardial effusion. Coronary artery calcification or stent. No aortic dissection. Mediastinum/Nodes: No enlarged mediastinal or hilar lymph nodes. Patulous esophagus without wall thickening. No thyroid nodule. Lungs/Pleura: Patchy ground-glass opacity in the right upper lobe, slightly perifissural but not peripheral or wedge-shaped. Minor dependent atelectasis in  the lower lobes. No pleural fluid. No pulmonary mass. Upper Abdomen: Enlarged liver with steatosis. No acute upper abdominal findings. Musculoskeletal: Diffuse thoracic spondylosis with endplate spurring and degenerative disc disease. No acute osseous abnormalities are seen. Review of the MIP images confirms the above findings. IMPRESSION: 1. Positive for pulmonary embolus with moderate clot burden and evidence of right heart strain, RV to LV ratio of 1.08. 2. Patchy ground-glass opacity in the right upper lobe, slightly perifissural but not peripheral or wedge-shaped. Favor pneumonia over pulmonary infarct. 3. Chronic dilatation of the main pulmonary artery consistent with pulmonary arterial hypertension. 4. Coronary artery calcification or stent. 5. Incidental hepatomegaly and steatosis in the upper abdomen. Critical Value/emergent results were called by telephone at the time of interpretation on 06/26/2021 at 6:58 pm to provider [facility] , who verbally acknowledged these results. Electronically Signed   By: Keith Rake M.D.   On: 06/26/2021 18:58        Scheduled Meds:  vitamin C  500 mg Oral Daily   aspirin  81 mg Oral Daily   atorvastatin  40 mg Oral QHS   carvedilol  12.5 mg Oral BID   famotidine  20 mg Oral Daily   fenofibrate  160 mg Oral Daily   furosemide  40 mg Oral Daily   glipiZIDE  10 mg Oral BID   hydrocerin   Topical Daily   insulin aspart  0-20 Units Subcutaneous TID WC   insulin regular human CONCENTRATED  50 Units Subcutaneous TID WC   Ipratropium-Albuterol  1 puff Inhalation BID   magnesium oxide  400 mg Oral BID   pantoprazole  40 mg Oral Daily   potassium chloride  20 mEq Oral BID   sertraline  50 mg Oral Daily   traZODone  50 mg Oral QHS   zinc sulfate  220 mg Oral Daily   Continuous Infusions:  heparin 2,600 Units/hr (06/28/21 [id number])   remdesivir 100 mg in NS 100 mL 100 mg (06/27/21 1823)     LOS: 2 days    Time spent: 38 minutes spent on chart  review, discussion with nursing staff, consultants, updating family and interview/physical exam; more than 50% of that time was spent in counseling and/or coordination of care.    Maurissa Ambrose J [place] DO [facility] Available via Epic secure chat 7am-7pm After these hours, please refer to coverage provider listed on [email or web address] 06/28/2021, 11:41 AM

## 2021-06-28 NOTE — Progress Notes (Signed)
Inpatient Diabetes Program Recommendations  AACE/ADA: New Consensus Statement on Inpatient Glycemic Control (2015)  Target Ranges:  Prepandial:   less than 140 mg/dL      Peak postprandial:   less than 180 mg/dL (1-2 hours)      Critically ill patients:  140 - 180 mg/dL   Lab Results  Component Value Date   GLUCAP 307 (H) 06/28/2021   HGBA1C 10.8 (H) 10/15/2020    Review of Glycemic Control  Diabetes history: DM2 Outpatient Diabetes medications: U-500 70-90 units TID, glipizide 10 mg BID Current orders for Inpatient glycemic control: U-500 50 units TID  HgbA1C - 10.8%  Inpatient Diabetes Program Recommendations:    Increase U-500 to 60 units TID with meals  Continue to follow.  Thank you. Lorenda Peck, RD, LDN, CDE Inpatient Diabetes Coordinator [phone]

## 2021-06-28 NOTE — Progress Notes (Signed)
ANTICOAGULATION CONSULT NOTE  Pharmacy Consult for heparin Indication: pulmonary embolus  Allergies  Allergen Reactions   Coconut Flavor [Flavoring Agent] Hives   Coconut Oil Hives   Ibuprofen Other (See Comments)    Made gastric ulcers worse   Aleve [Naproxen] Other (See Comments)    DUE TO KIDNEYS   Nsaids Other (See Comments)    Stomach ulcers    Vascepa [Icosapent Ethyl] Other (See Comments)    headaches, chest pain, similar to sx of a stroke, hypotension     Patient Measurements: Height: '5\' 10"'$  (177.8 cm) Weight: (!) 156 kg (343 lb 14.7 oz) IBW/kg (Calculated) : 73 Heparin Dosing Weight: 108.3 kg  Vital Signs: Temp: 98.6 F (37 C) (09/05 0824) Temp Source: Oral (09/05 0824) BP: 155/91 (09/05 0824) Pulse Rate: 100 (09/05 0824)  Labs: Recent Labs    06/26/21 1139 06/26/21 2001 06/27/21 0142 06/27/21 1037 06/27/21 1858 06/28/21 0045 06/28/21 0827  HGB 12.9*  --  12.2*  --   --  12.1*  --   HCT 39.1  --  37.0*  --   --  36.4*  --   PLT 209  --  186  --   --  187  --   APTT  --  137*  --   --   --   --   --   LABPROT  --  14.8  --   --   --   --   --   INR  --  1.2  --   --   --   --   --   HEPARINUNFRC  --   --  <0.10*   < > 0.16* 0.34 0.48  CREATININE 0.84  --  0.78  --   --  0.76  --   TROPONINIHS 167* 165*  --   --   --   --   --    < > = values in this interval not displayed.     Estimated Creatinine Clearance: 153 mL/min (by C-G formula based on SCr of 0.76 mg/dL).   Assessment:  60 yo male presented with worsening SOB and found to have PE on CTA in the ED yesterday (9/3) with elevated RV:LV ratio of 1.08, moderate clot buirden and evidence of right heart strain. Past medical history significant for CHF, DVT (no AC), recent COVID 19 dx.  Nurse reported that heparin level was turned off at 1819 yesterday (9/4) to infuse another medication. Heparin level was drawn at 1858 while heparin infusion was off, and restarted at 1900. The subsequent  subtherapeutic level was deemed clinical insignificant d/t infusion pause and infusion continued at 2450 units/hr.   Following heparin level was therapeutic at 0.34 but at lower end of goal (higher goal d/t acute PE).  Heparin level now therapeutic at 0.48 after empiric rate increase to 2600 units/hr. CBC stable, no infusion problems or bleeding noted.  Goal of Therapy:  Heparin level 0.3-0.7 units/ml Monitor platelets by anticoagulation protocol: Yes   Plan:  Continue heparin infusion at 2600 units/hr Daily heparin level and CBC Monitor for s/sx bleeding  Laurey Arrow, PharmD PGY1 Pharmacy Resident 06/28/2021  9:29 AM  Please check [email or web address] for unit-specific pharmacy phone numbers.

## 2021-06-28 NOTE — Progress Notes (Signed)
  Echocardiogram 2D Echocardiogram has been performed.  [name] 06/28/2021, 12:50 PM

## 2021-06-28 NOTE — Progress Notes (Signed)
ANTICOAGULATION CONSULT NOTE - Follow Up Consult  Pharmacy Consult for heparin Indication: pulmonary embolus  Labs: Recent Labs    06/26/21 1139 06/26/21 1139 06/26/21 2001 06/27/21 0142 06/27/21 1037 06/27/21 1858 06/28/21 0045  HGB 12.9*  --   --  12.2*  --   --  12.1*  HCT 39.1  --   --  37.0*  --   --  36.4*  PLT 209  --   --  186  --   --  187  APTT  --   --  137*  --   --   --   --   LABPROT  --   --  14.8  --   --   --   --   INR  --   --  1.2  --   --   --   --   HEPARINUNFRC  --    < >  --  <0.10* 0.30 0.16* 0.34  CREATININE 0.84  --   --  0.78  --   --   --   TROPONINIHS 167*  --  165*  --   --   --   --    < > = values in this interval not displayed.    Assessment: 60yo male therapeutic on heparin after resumed but at very low end of goal, would prefer higher with acute PE; no infusion issues or signs of bleeding per RN.  Goal of Therapy:  Heparin level 0.3-0.7 units/ml   Plan:  Will increase heparin infusion by 4 unit/kg/hr to 2600 units/hr and check level in 6 hours.    Wynona Neat, PharmD, BCPS  06/28/2021,1:28 AM

## 2021-06-29 ENCOUNTER — Other Ambulatory Visit (HOSPITAL_COMMUNITY): Payer: Self-pay

## 2021-06-29 LAB — CBC
HCT: 37.3 % — ABNORMAL LOW (ref 39.0–52.0)
Hemoglobin: 12.3 g/dL — ABNORMAL LOW (ref 13.0–17.0)
MCH: 31.5 pg (ref 26.0–34.0)
MCHC: 33 g/dL (ref 30.0–36.0)
MCV: 95.4 fL (ref 80.0–100.0)
Platelets: 207 10*3/uL (ref 150–400)
RBC: 3.91 MIL/uL — ABNORMAL LOW (ref 4.22–5.81)
RDW: 14 % (ref 11.5–15.5)
WBC: 11 10*3/uL — ABNORMAL HIGH (ref 4.0–10.5)
nRBC: 0.4 % — ABNORMAL HIGH (ref 0.0–0.2)

## 2021-06-29 LAB — COMPREHENSIVE METABOLIC PANEL
ALT: 26 U/L (ref 0–44)
AST: 23 U/L (ref 15–41)
Albumin: 3.3 g/dL — ABNORMAL LOW (ref 3.5–5.0)
Alkaline Phosphatase: 48 U/L (ref 38–126)
Anion gap: 7 (ref 5–15)
BUN: 5 mg/dL — ABNORMAL LOW (ref 6–20)
CO2: 30 mmol/L (ref 22–32)
Calcium: 8.4 mg/dL — ABNORMAL LOW (ref 8.9–10.3)
Chloride: 99 mmol/L (ref 98–111)
Creatinine, Ser: 0.78 mg/dL (ref 0.61–1.24)
GFR, Estimated: >60 mL/min (ref >60)
Glucose, Bld: 202 mg/dL — ABNORMAL HIGH (ref 70–99)
Potassium: 3.7 mmol/L (ref 3.5–5.1)
Sodium: 136 mmol/L (ref 135–145)
Total Bilirubin: 0.7 mg/dL (ref 0.3–1.2)
Total Protein: 6.7 g/dL (ref 6.5–8.1)

## 2021-06-29 LAB — HEMOGLOBIN A1C
Hgb A1c MFr Bld: 10.4 % — ABNORMAL HIGH (ref 4.8–5.6)
Mean Plasma Glucose: 252 mg/dL

## 2021-06-29 LAB — GLUCOSE, CAPILLARY
Glucose-Capillary: 206 mg/dL — ABNORMAL HIGH (ref 70–99)
Glucose-Capillary: 225 mg/dL — ABNORMAL HIGH (ref 70–99)
Glucose-Capillary: 214 mg/dL — ABNORMAL HIGH (ref 70–99)

## 2021-06-29 LAB — HEPARIN LEVEL (UNFRACTIONATED): Heparin Unfractionated: 0.33 IU/mL (ref 0.30–0.70)

## 2021-06-29 MED ORDER — APIXABAN 5 MG PO TABS: 5.0000 mg | ORAL_TABLET | Freq: Two times a day (BID) | ORAL | Status: DC

## 2021-06-29 MED ORDER — APIXABAN 5 MG PO TABS
10.0000 mg | ORAL_TABLET | Freq: Two times a day (BID) | ORAL | Status: DC
Start: 2021-06-29 — End: 2021-06-29
  Administered 2021-06-29: 10 mg via ORAL
  Filled 2021-06-29: qty 2

## 2021-06-29 MED ORDER — APIXABAN 5 MG PO TABS: ORAL_TABLET | ORAL | 0 refills | Status: DC

## 2021-06-29 NOTE — Discharge Summary (Signed)
Physician Discharge Summary  [name] [id number] DOB: 1961-07-15 DOA: 06/26/2021  PCP: [name], FNP  Admit date: 06/26/2021 Discharge date: 06/29/2021  Admitted From: Home Disposition: Home  Recommendations for Outpatient Follow-up:  Follow up with PCP in 1-2 weeks Started on Eliquis for acute PE, will need refills and lifelong anticoagulation as this is now his second thrombotic event Recommend hypercoagulable work-up outpatient Recommend further discussion regarding weight loss Outpatient sleep study  Home Health: No Equipment/Devices: Oxygen, 3 L per nasal cannula  Discharge Condition: Stable CODE STATUS: Full code Diet recommendation: Heart healthy/consistent carb regular diet  History of present illness:  [name] is a 60 year old male with past medical history significant for type 2 diabetes mellitus, essential hypertension, 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   Patient also endorses cough, fever, shortness of breath and body aches.   Patient has been vaccinated for Covid-19 with booster.   In the ED, temperature 98.5 F, HR 112, RR 18, BP 113/54, SPO2 83% on room air, improved to 95% on 3 L nasal cannula.  Sodium 138, potassium 3.7, chloride 98, CO2 28, glucose 227, BUN 13, creatinine 0.84.  BNP 55.4.  High-sensitivity troponin 167> 165.  WBC 10.5, hemoglobin 12.9, platelets 209.  Chest x-ray with stable cardiomegaly with vascular congestion, mild basilar atelectasis, no consolidation.  EKG with sinus tachycardia, rate 114, QTc 441, no concerning dynamic changes.  CTA chest positive for PE with moderate clot burden and evidence of right heart strain, patchy GGO right upper lobe, chronic dilatation main pulmonary artery consistent with PAH.   Procalcitonin less than 0.10.  Patient was started on heparin drip.  Hospitalist service consulted for further evaluation and management of acute hypoxic respiratory failure secondary to pulmonary embolism in the setting of recent COVID-19 viral infection.  Hospital course:  Acute hypoxic respiratory failure, POA Acute pulmonary embolism, recurrent Patient presenting to the ED from urgent care after being found hypoxic, tachycardic, tachypneic in mild respiratory distress.  CT angiogram chest with pulmonary embolism distal right main pulmonary artery extending into the upper/middle/lower lobar branches with subtotally occlusion with RV strain.  Patient with history of previous PE/DVT 8 years ago and completed anticoagulation course.  Denies any family history of hypercoagulable state and denies any recent travel or trauma.  Initially was started on a heparin drip and transition to Eliquis.  TTE with LVEF 60 to 65%, no LV regional wall motion abnormalities, mild LVH, IVC dilated.  Vascular duplex ultrasound bilateral lower extremities negative for DVT.  Patient require some naloxone on discharge, 3 L per nasal cannula while ambulating.  Needs close outpatient follow-up with PCP for further refills of Eliquis.  Recommend hypercoagulable work-up outpatient.  Will need lifelong anticoagulation given recurrent thrombotic event.   COVID-19 viral infection Patient reports recent diagnosis 11 days prior.  Has been vaccinated with booster.  Imaging with mild GGO right upper lobe, otherwise unrevealing without consolidation.  Etiology of hypoxia likely related to his pulmonary embolism with subocclusive disease as above.  Completed course of remdesivir during hospitalization.  Continue isolation/quarantine per CDC guidelines.   Essential hypertension Continue home carvedilol 12.5 mg p.o. twice daily, isosorbide mononitrate 30 mg p.o. daily, lisinopril 5 mg p.o. daily, furosemide 40 mg TID at home.    Dyslipidemia Continue atorvastatin 40 mg  p.o. daily and fenofibrate 160 mg p.o. daily   Type 2 diabetes mellitus Hemoglobin A1c 10.8 10/15/2020, poorly controlled.  Home regimen includes Humulin R 70-90 units 3 times daily, glipizide 10 mg twice daily.  Outpatient follow-up with PCP for further guidance and monitoring.   Chronic diastolic congestive heart failure Continue furosemide, carvedilol.  Recommend monitoring daily weights.  Outpatient follow-up with cardiology.   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  Noncompliance with CPAP outpatient.  Recommend repeat sleep study and counseling on need for CPAP.   Left lower extremity ulceration malleolus, POA Evaluated by wound care. Cleanse with soap and water, Eucerin cream covered with antimicrobial nonadherent Xeroform gauze with ABD pad and secured with Kerlix roll.  Continue outpatient follow-up with wound care center.   GERD: Continue PPI   Depression: Sertraline 50 mg p.o. daily   Hypokalemia Potassium 3.4, rebleeding. --Repeat electrolytes include magnesium in the a.m.   Morbid obesity Body mass index is 49.35 kg/m.  Discussed with patient needs for aggressive lifestyle changes/weight loss as this complicates all facets of care.  Outpatient follow-up with PCP.  May benefit from bariatric evaluation outpatient.  Discharge Diagnoses:  Principal Problem:   Acute pulmonary embolism (HCC) Active Problems:   HTN (hypertension)   Type 2 diabetes mellitus with vascular disease (HCC)   Chronic diastolic CHF (congestive heart failure) ([place])   Diabetic ulcer of lower leg (HCC)   Acute respiratory failure with hypoxia ([place])   COVID-19 virus infection    Discharge Instructions  Discharge Instructions     (HEART FAILURE PATIENTS) Call MD:  Anytime you have any of the following symptoms: 1) 3 pound weight gain in 24 hours or 5 pounds in 1 week 2) shortness of breath, with or  without a dry hacking cough 3) swelling in the hands, feet or stomach 4) if you have to sleep on extra pillows at night in order to breathe.   Complete by: As directed    Call MD for:  difficulty breathing, headache or visual disturbances   Complete by: As directed    Call MD for:  extreme fatigue   Complete by: As directed    Call MD for:  persistant dizziness or light-headedness   Complete by: As directed    Call MD for:  persistant nausea and vomiting   Complete by: As directed    Call MD for:  severe uncontrolled pain   Complete by: As directed    Call MD for:  temperature >100.4   Complete by: As directed    Diet - low sodium heart healthy   Complete by: As directed    Discharge wound care:   Complete by: As directed    Wound care to bilateral Lower extremities:  Wash with soap and water, rinse and pat dry. Dry well between digits of both feet.  Apply a thin layer of Eucerin cream to both LEs and feet. Do not apply between toes. Place a folded piece of xeroform gauze over open areas on Left LE. Top with ABD pad and secure with a few turns of Kerlix roll gauze/paper tape   Increase activity slowly   Complete by: As directed       Allergies as of 06/29/2021       Reactions   Coconut Flavor [flavoring Agent] Hives   Coconut Oil Hives   Ibuprofen Other (See Comments)   Made gastric ulcers worse   Aleve [naproxen] Other (See Comments)  DUE TO KIDNEYS   Nsaids Other (See Comments)   Stomach ulcers   Vascepa [icosapent Ethyl] Other (See Comments)   headaches, chest pain, similar to sx of a stroke, hypotension         Medication List     STOP taking these medications    amoxicillin-clavulanate 875-125 MG tablet Commonly known as: AUGMENTIN       TAKE these medications    Accu-Chek FastClix Lancets Misc 1 Device by Other route 2 (two) times daily.   Accu-Chek Guide test strip Generic drug: glucose blood 1 each by Other route 2 (two) times daily.   albuterol 108  (90 Base) MCG/ACT inhaler Commonly known as: VENTOLIN HFA Inhale 2 puffs into the lungs every 6 (six) hours as needed for wheezing or shortness of breath. As Directed   apixaban 5 MG Tabs tablet Commonly known as: Eliquis Take 2 tablets ('10mg'$ ) twice daily for 7 days, then 1 tablet ('5mg'$ ) twice daily   aspirin 81 MG chewable tablet Chew 1 tablet (81 mg total) by mouth daily.   atorvastatin 40 MG tablet Commonly known as: LIPITOR Take 40 mg by mouth at bedtime.   carvedilol 12.5 MG tablet Commonly known as: COREG Take 12.5 mg by mouth 2 (two) times daily.   diclofenac 1.3 % Ptch Commonly known as: FLECTOR Place 1 patch onto the skin daily as needed for pain.   famotidine 20 MG tablet Commonly known as: PEPCID Take 20 mg by mouth daily.   fenofibrate 145 MG tablet Commonly known as: TRICOR Take 1 tablet (145 mg total) by mouth daily.   furosemide 40 MG tablet Commonly known as: Lasix Take 1 tablet (40 mg total) by mouth daily. PLEASE RESUME ONLY FROM 10/21/2020. DOSE HAS BEEN CHANGED. What changed:  when to take this additional instructions   glipiZIDE 10 MG tablet Commonly known as: GLUCOTROL Take 10 mg by mouth 2 (two) times daily.   HumuLIN R U-500 KwikPen 500 UNIT/ML KwikPen Generic drug: insulin regular human CONCENTRATED Inject 70-90 Units into the skin See admin instructions. Inject under the skin three times daily as directed or sliding scale:   Insulin Pen Needle 31G X 5 MM Misc Use 1 needle daily to inject insulin as prescribed What changed: how much to take   isosorbide mononitrate 30 MG 24 hr tablet Commonly known as: IMDUR TAKE 1 TABLET BY MOUTH DAILY   lisinopril 5 MG tablet Commonly known as: ZESTRIL Take 5 mg by mouth daily.   nitroGLYCERIN 0.4 MG SL tablet Commonly known as: NITROSTAT Place 1 tablet (0.4 mg total) under the tongue every 5 (five) minutes as needed for chest pain.   ondansetron 4 MG tablet Commonly known as: ZOFRAN Take 4 mg  by mouth 2 (two) times daily as needed for nausea/vomiting.   Oxycodone HCl 20 MG Tabs Take 1 tablet by mouth 4 (four) times daily as needed for pain. LF on 05-31-21 # 120   pantoprazole 40 MG tablet Commonly known as: PROTONIX Take 40 mg by mouth daily.   potassium chloride 10 MEQ CR capsule Commonly known as: MICRO-K Take 20 mEq by mouth in the morning and at bedtime.   sertraline 50 MG tablet Commonly known as: ZOLOFT Take 50 mg by mouth daily.   tiZANidine 4 MG tablet Commonly known as: ZANAFLEX Take 4 mg by mouth at bedtime as needed for muscle spasms.   traZODone 50 MG tablet Commonly known as: DESYREL Take 1 tablet (50 mg total) by mouth at  bedtime.   Vitamin D (Ergocalciferol) 1.25 MG (50000 UNIT) Caps capsule Commonly known as: DRISDOL Take 50,000 Units by mouth every Monday.               Durable Medical Equipment  (From admission, onward)           Start     Ordered   06/29/21 1129  For home use only DME oxygen  Once       Comments: POC eval  Question Answer Comment  Length of Need 6 Months   Mode or (Route) Nasal cannula   Liters per Minute 3   Frequency Continuous (stationary and portable oxygen unit needed)   Oxygen conserving device Yes   Oxygen delivery system Gas      06/29/21 1128              Discharge Care Instructions  (From admission, onward)           Start     Ordered   06/29/21 0000  Discharge wound care:       Comments: Wound care to bilateral Lower extremities:  Wash with soap and water, rinse and pat dry. Dry well between digits of both feet.  Apply a thin layer of Eucerin cream to both LEs and feet. Do not apply between toes. Place a folded piece of xeroform gauze over open areas on Left LE. Top with ABD pad and secure with a few turns of Kerlix roll gauze/paper tape   06/29/21 1134            Follow-up Information     [name], FNP. Schedule an appointment as soon as possible for a visit in 1  week(s).   Specialty: Family Medicine Why: Will need refills for [employer] information: [address] [place] [address] [phone]         Lelon Perla, MD .   Specialty: Cardiology Contact information: [address] [place] [address] [phone]                Allergies  Allergen Reactions   Coconut Flavor [Flavoring Agent] Hives   Coconut Oil Hives   Ibuprofen Other (See Comments)    Made gastric ulcers worse   Aleve [Naproxen] Other (See Comments)    DUE TO KIDNEYS   Nsaids Other (See Comments)    Stomach ulcers    Vascepa [Icosapent Ethyl] Other (See Comments)    headaches, chest pain, similar to sx of a stroke, hypotension     Consultations: None   Procedures/Studies: DG Chest 2 View  Result Date: 06/26/2021 CLINICAL DATA:  Chest pain and shortness of breath EXAM: CHEST - 2 VIEW COMPARISON:  06/15/2021 FINDINGS: Stable cardiomegaly with vascular congestion. Minor basilar atelectasis. Negative for pneumonia, CHF, effusion or pneumothorax. Trachea midline. Degenerative changes of the spine. Lateral view is limited with motion artifact. No severe compression fracture. IMPRESSION: Stable chest exam. No interval change or acute process by plain radiography. Electronically Signed   By: Jerilynn Mages.  Shick M.D.   On: 06/26/2021 12:19   DG Chest 2 View  Result Date: 06/15/2021 CLINICAL DATA:  Shortness of breath, cough, fever EXAM: CHEST - 2 VIEW COMPARISON:  Chest radiograph 06/05/2021 FINDINGS: The heart is enlarged, unchanged. The mediastinal contours are stable. There is vascular congestion without frank interstitial edema. There is no focal consolidation. There is no pleural effusion. There is no pneumothorax. There is no acute osseous abnormality. IMPRESSION: 1. Cardiomegaly with vascular  congestion but no overt pulmonary edema, overall unchanged. 2. No new focal airspace disease. Electronically Signed   By: Valetta Mole M.D.   On:  06/15/2021 11:48   DG Chest 2 View  Result Date: 06/05/2021 CLINICAL DATA:  Chest pain, shortness of breath EXAM: CHEST - 2 VIEW COMPARISON:  06/04/2021 FINDINGS: Heart size is upper limits of normal. Mild pulmonary vascular congestion. No airspace consolidation or overt pulmonary edema. No pleural effusion or pneumothorax. Degenerative changes within the thoracic spine. IMPRESSION: Mild pulmonary vascular congestion without overt pulmonary edema. Electronically Signed   By: Davina Poke D.O.   On: 06/05/2021 12:48   CT Angio Chest PE W and/or Wo Contrast  Result Date: 06/26/2021 CLINICAL DATA:  Shortness of breath. High probability for pulmonary embolus. EXAM: CT ANGIOGRAPHY CHEST WITH CONTRAST TECHNIQUE: Multidetector CT imaging of the chest was performed using the standard protocol during bolus administration of intravenous contrast. Multiplanar CT image reconstructions and MIPs were obtained to evaluate the vascular anatomy. CONTRAST:  14m OMNIPAQUE IOHEXOL 350 MG/ML SOLN COMPARISON:  Radiograph earlier today.  Chest CTA 07/31/2019 FINDINGS: Cardiovascular: Positive for pulmonary embolus with filling defects involving the distal right main pulmonary artery extending into the upper, middle, and lower lobar branches. Thrombus is subtotally occlusive. Segmental filling defects in the left lower lobe pulmonary artery, and subsegmental left upper lobe pulmonary arterial filling defects. There is dilatation of the main pulmonary artery at 4.8 cm, chronic. Elevated RV to LV ratio of 1.08. Heart size upper normal. There is no pericardial effusion. Coronary artery calcification or stent. No aortic dissection. Mediastinum/Nodes: No enlarged mediastinal or hilar lymph nodes. Patulous esophagus without wall thickening. No thyroid nodule. Lungs/Pleura: Patchy ground-glass opacity in the right upper lobe, slightly perifissural but not peripheral or wedge-shaped. Minor dependent atelectasis in the lower lobes. No  pleural fluid. No pulmonary mass. Upper Abdomen: Enlarged liver with steatosis. No acute upper abdominal findings. Musculoskeletal: Diffuse thoracic spondylosis with endplate spurring and degenerative disc disease. No acute osseous abnormalities are seen. Review of the MIP images confirms the above findings. IMPRESSION: 1. Positive for pulmonary embolus with moderate clot burden and evidence of right heart strain, RV to LV ratio of 1.08. 2. Patchy ground-glass opacity in the right upper lobe, slightly perifissural but not peripheral or wedge-shaped. Favor pneumonia over pulmonary infarct. 3. Chronic dilatation of the main pulmonary artery consistent with pulmonary arterial hypertension. 4. Coronary artery calcification or stent. 5. Incidental hepatomegaly and steatosis in the upper abdomen. Critical Value/emergent results were called by telephone at the time of interpretation on 06/26/2021 at 6:58 pm to [facility], who verbally acknowledged these results. Electronically Signed   By: MKeith RakeM.D.   On: 06/26/2021 18:58   VAS [place]OWER EXTREMITY VENOUS (DVT)  Result Date: 06/28/2021  Lower Venous DVT Study Patient Name:  JSANJEEV RENNA Date of Exam:   06/28/2021 Medical Rec #: [id number]        Accession #:    [id number] of Birth: 112-Dec-1962       Patient Gender: M Patient Age:   89years [facility]      [facility] [place]OWER EXTREMITY VENOUS (DVT) Referring Phys: JJennette Kettle--------------------------------------------------------------------------------  Indications: Pulmonary embolism.  Risk Factors: COVID 19 positive. Limitations: Body habitus, poor ultrasound/tissue interface and calf skin induration. Comparison Study: No prior studies. Performing Technologist: GOliver HumRVT  Examination Guidelines: A complete evaluation includes B-mode imaging, spectral Doppler, color  Doppler, and power Doppler as needed of all accessible portions of each vessel.  Bilateral testing is considered an integral part of a complete examination. Limited examinations for reoccurring indications may be performed as noted. The reflux portion of the exam is performed with the patient in reverse Trendelenburg.  +---------+---------------+---------+-----------+----------+-------------------+ RIGHT    CompressibilityPhasicitySpontaneityPropertiesThrombus Aging      +---------+---------------+---------+-----------+----------+-------------------+ CFV      Full           Yes      Yes                                      +---------+---------------+---------+-----------+----------+-------------------+ SFJ      Full                                                             +---------+---------------+---------+-----------+----------+-------------------+ FV Prox  Full                                                             +---------+---------------+---------+-----------+----------+-------------------+ FV Mid   Full                                                             +---------+---------------+---------+-----------+----------+-------------------+ FV DistalFull                                                             +---------+---------------+---------+-----------+----------+-------------------+ PFV      Full                                                             +---------+---------------+---------+-----------+----------+-------------------+ POP      Full           Yes      Yes                                      +---------+---------------+---------+-----------+----------+-------------------+ PTV                                                   Not well visualized +---------+---------------+---------+-----------+----------+-------------------+ PERO  Not well visualized +---------+---------------+---------+-----------+----------+-------------------+    +---------+---------------+---------+-----------+----------+-------------------+ LEFT     CompressibilityPhasicitySpontaneityPropertiesThrombus Aging      +---------+---------------+---------+-----------+----------+-------------------+ CFV      Full           Yes      Yes                                      +---------+---------------+---------+-----------+----------+-------------------+ SFJ      Full                                                             +---------+---------------+---------+-----------+----------+-------------------+ FV Prox  Full                                                             +---------+---------------+---------+-----------+----------+-------------------+ FV Mid   Full                                                             +---------+---------------+---------+-----------+----------+-------------------+ FV DistalFull                                                             +---------+---------------+---------+-----------+----------+-------------------+ PFV      Full                                                             +---------+---------------+---------+-----------+----------+-------------------+ POP      Full           Yes      Yes                                      +---------+---------------+---------+-----------+----------+-------------------+ PTV                                                   Not well visualized +---------+---------------+---------+-----------+----------+-------------------+ PERO                                                  Not well visualized +---------+---------------+---------+-----------+----------+-------------------+     Summary: RIGHT: - There is no evidence of deep vein thrombosis in  the lower extremity. However, portions of this examination were limited- see technologist comments above.  - No cystic structure found in the popliteal fossa.  LEFT: - There is  no evidence of deep vein thrombosis in the lower extremity. However, portions of this examination were limited- see technologist comments above.  - No cystic structure found in the popliteal fossa.  *See table(s) above for measurements and observations. Electronically signed by Deitra Mayo MD on 06/28/2021 at 3:16:58 PM.    Final    ECHOCARDIOGRAM LIMITED  Result Date: 06/28/2021    ECHOCARDIOGRAM LIMITED REPORT   Patient Name:   TYRES WIERZBA Date of Exam: 06/28/2021 Medical Rec #:  [id number]        Height:       70.0 in Accession #:    [id number]       Weight:       343.9 lb Date of Birth:  06-19-1961       BSA:          2.628 m Patient Age:    68 years         BP:           133/87 mmHg Patient Gender: M                HR:           92 bpm. Exam Location:  Inpatient Procedure: Limited Echo, Cardiac Doppler, Color Doppler and Intracardiac            Opacification Agent Indications:    Pulmonary embolus  History:        Patient has prior history of Echocardiogram examinations. CHF,                 CAD; Risk Factors:Diabetes, Dyslipidemia and Hypertension. H/O                 stroke. COVID-19.  Sonographer:    Clayton Lefort RDCS (AE) Referring Phys: [phone] JARED M GARDNER  Sonographer Comments: Technically difficult study due to poor echo windows, suboptimal apical window and patient is morbidly obese. Image acquisition challenging due to patient body habitus. COVID-19. IMPRESSIONS  1. Left ventricular ejection fraction, by estimation, is 60 to 65%. The left ventricle has normal function. The left ventricle has no regional wall motion abnormalities. There is moderate left ventricular hypertrophy.  2. Right ventricular systolic function is normal. The right ventricular size is normal.  3. The mitral valve is normal in structure. No evidence of mitral valve regurgitation. No evidence of mitral stenosis.  4. The aortic valve is normal in structure. Aortic valve regurgitation is not visualized. No aortic stenosis  is present.  5. The inferior vena cava is dilated in size with >50% respiratory variability, suggesting right atrial pressure of 8 mmHg. FINDINGS  Left Ventricle: Left ventricular ejection fraction, by estimation, is 60 to 65%. The left ventricle has normal function. The left ventricle has no regional wall motion abnormalities. Definity contrast agent was given IV to delineate the left ventricular  endocardial borders. The left ventricular internal cavity size was normal in size. There is moderate left ventricular hypertrophy. Right Ventricle: The right ventricular size is normal. No increase in right ventricular wall thickness. Right ventricular systolic function is normal. Left Atrium: Left atrial size was normal in size. Right Atrium: Right atrial size was normal in size. Pericardium: There is no evidence of pericardial effusion. Mitral Valve: The mitral valve is normal in structure. No evidence of mitral valve stenosis.  Tricuspid Valve: The tricuspid valve is normal in structure. Tricuspid valve regurgitation is not demonstrated. No evidence of tricuspid stenosis. Aortic Valve: The aortic valve is normal in structure. Aortic valve regurgitation is not visualized. No aortic stenosis is present. Pulmonic Valve: The pulmonic valve was normal in structure. Pulmonic valve regurgitation is not visualized. No evidence of pulmonic stenosis. Aorta: The aortic root is normal in size and structure. Venous: The inferior vena cava is dilated in size with greater than 50% respiratory variability, suggesting right atrial pressure of 8 mmHg. IAS/Shunts: No atrial level shunt detected by color flow Doppler. LEFT VENTRICLE PLAX 2D LVIDd:         4.10 cm  Diastology LVIDs:         3.00 cm  LV e' medial:    8.05 cm/s LV PW:         1.60 cm  LV E/e' medial:  8.1 LV IVS:        1.60 cm  LV e' lateral:   8.70 cm/s LVOT diam:     2.10 cm  LV E/e' lateral: 7.5 LVOT Area:     3.46 cm  IVC IVC diam: 2.90 cm LEFT ATRIUM         Index LA  diam:    2.30 cm 0.88 cm/m   AORTA Ao Root diam: 3.60 cm Ao Asc diam:  3.70 cm MITRAL VALVE MV Area (PHT): 3.99 cm    SHUNTS MV Decel Time: 190 msec    Systemic Diam: 2.10 cm MV E velocity: 65.40 cm/s MV A velocity: 65.40 cm/s MV E/A ratio:  1.00 Jenkins Rouge MD Electronically signed by Jenkins Rouge MD Signature Date/Time: 06/28/2021/12:52:55 PM    Final      Subjective: Patient seen examined at bedside, resting comfortably.  Sitting in bedside chair.  Continues with mild shortness of breath especially with deep inspiration.  Remains on 3 L nasal cannula.  Discharging home today on Eliquis.  Encourage close follow-up with PCP.  No other questions or concerns at this time.  Denies headache, no chest pain, no palpitations, no dizziness, no abdominal pain, no fever/chills/night sweats, no nausea/vomiting/diarrhea, no weakness, no fatigue, no paresthesias.  No acute events overnight per staff.  Discharge Exam: Vitals:   06/29/21 0816 06/29/21 1045  BP: 120/70 (!) 152/78  Pulse: 83 87  Resp: 18 20  Temp: 97.8 F (36.6 C) 98.2 F (36.8 C)  SpO2: 98% 96%   Vitals:   06/29/21 0300 06/29/21 0552 06/29/21 0816 06/29/21 1045  BP: 133/81  120/70 (!) 152/78  Pulse: 78 87 83 87  Resp: '20 20 18 20  '$ Temp: 98.1 F (36.7 C)  97.8 F (36.6 C) 98.2 F (36.8 C)  TempSrc: Oral  Oral Oral  SpO2: 94% 92% 98% 96%  Weight:  (!) 156.9 kg    Height:        General: Pt is alert, awake, not in acute distress Cardiovascular: RRR, S1/S2 +, no rubs, no gallops Respiratory: CTA bilaterally, no wheezing, no rhonchi, on 3 L nasal cannula with SPO2 96% at rest Abdominal: Soft, NT, ND, bowel sounds + Extremities: Chronic 2+ lower extremity edema up to mid shins with chronic skin/venous changes, left lower extremity malleoli are region partial-thickness ulceration noted, no cyanosis    The results of significant diagnostics from this hospitalization (including imaging, microbiology, ancillary and laboratory) are  listed below for reference.     Microbiology: No results found for this or any previous visit (from the past  240 hour(s)).   Labs: BNP (last 3 results) Recent Labs    06/05/21 1142 06/15/21 1102 06/26/21 1140  BNP 31.9 13.3 [id number]   Basic Metabolic Panel: Recent Labs  Lab 06/26/21 1139 06/27/21 0142 06/28/21 0045 06/29/21 0107  NA 138 137 136 136  K 3.7 3.4* 3.5 3.7  CL 98 100 101 99  CO2 '28 24 24 30  '$ GLUCOSE 227* 232* 247* 202*  BUN '13 11 8 '$ 5*  CREATININE 0.84 0.78 0.76 0.78  CALCIUM 9.3 8.8* 8.6* 8.4*  MG  --   --  1.8  --    Liver Function Tests: Recent Labs  Lab 06/27/21 0142 06/28/21 0045 06/29/21 0107  AST 37 28 23  ALT '30 28 26  '$ ALKPHOS 45 45 48  BILITOT 0.9 0.9 0.7  PROT 6.5 6.6 6.7  ALBUMIN 3.2* 3.3* 3.3*   No results for input(s): LIPASE, AMYLASE in the last 168 hours. No results for input(s): AMMONIA in the last 168 hours. CBC: Recent Labs  Lab 06/26/21 1139 06/27/21 0142 06/28/21 0045 06/29/21 0107  WBC 10.5 9.4 10.1 11.0*  HGB 12.9* 12.2* 12.1* 12.3*  HCT 39.1 37.0* 36.4* 37.3*  MCV 95.1 94.4 94.3 95.4  PLT 209 186 187 207   Cardiac Enzymes: No results for input(s): CKTOTAL, CKMB, CKMBINDEX, TROPONINI in the last 168 hours. BNP: Invalid input(s): POCBNP CBG: Recent Labs  Lab 06/28/21 1117 06/28/21 1544 06/28/21 2141 06/29/21 0558 06/29/21 1053  GLUCAP 307* 261* 231* 206* 225*   D-Dimer No results for input(s): DDIMER in the last 72 hours. Hgb A1c Recent Labs    06/27/21 0142  HGBA1C 10.4*   Lipid Profile No results for input(s): CHOL, HDL, LDLCALC, TRIG, CHOLHDL, LDLDIRECT in the last 72 hours. Thyroid function studies No results for input(s): TSH, T4TOTAL, T3FREE, THYROIDAB in the last 72 hours.  Invalid input(s): FREET3 Anemia work up No results for input(s): VITAMINB12, FOLATE, FERRITIN, TIBC, IRON, RETICCTPCT in the last 72 hours. Urinalysis    Component Value Date/Time   COLORURINE COLORLESS (A) 06/05/2021  1422   APPEARANCEUR CLEAR 06/05/2021 1422   LABSPEC 1.005 06/05/2021 1422   PHURINE 7.0 06/05/2021 1422   GLUCOSEU 150 (A) 06/05/2021 1422   HGBUR SMALL (A) 06/05/2021 1422   BILIRUBINUR NEGATIVE 06/05/2021 1422   KETONESUR NEGATIVE 06/05/2021 1422   PROTEINUR NEGATIVE 06/05/2021 1422   UROBILINOGEN 0.2 05/27/2014 2312   NITRITE NEGATIVE 06/05/2021 1422   LEUKOCYTESUR NEGATIVE 06/05/2021 1422   Sepsis Labs Invalid input(s): PROCALCITONIN,  WBC,  LACTICIDVEN Microbiology No results found for this or any previous visit (from the past 240 hour(s)).   Time coordinating discharge: Over 30 minutes  SIGNED:   Donnamarie Poag [place] DO  [facility] 06/29/2021, 11:37 AM

## 2021-06-29 NOTE — Progress Notes (Signed)
ANTICOAGULATION CONSULT NOTE - Consult  Pharmacy Consult for Apixaban from Heparin Indication: pulmonary embolus  Allergies  Allergen Reactions   Coconut Flavor [Flavoring Agent] Hives   Coconut Oil Hives   Ibuprofen Other (See Comments)    Made gastric ulcers worse   Aleve [Naproxen] Other (See Comments)    DUE TO KIDNEYS   Nsaids Other (See Comments)    Stomach ulcers    Vascepa [Icosapent Ethyl] Other (See Comments)    headaches, chest pain, similar to sx of a stroke, hypotension     Patient Measurements: Height: '5\' 10"'$  (177.8 cm) Weight: (!) 156.9 kg (345 lb 14.4 oz) IBW/kg (Calculated) : 73 Heparin Dosing Weight: 108 kg  Vital Signs: Temp: 97.8 F (36.6 C) (09/06 0816) Temp Source: Oral (09/06 0816) BP: 120/70 (09/06 0816) Pulse Rate: 83 (09/06 0816)  Labs: Recent Labs    06/26/21 1139 06/26/21 2001 06/27/21 0142 06/27/21 1037 06/28/21 0045 06/28/21 0827 06/29/21 0107  HGB 12.9*  --  12.2*  --  12.1*  --  12.3*  HCT 39.1  --  37.0*  --  36.4*  --  37.3*  PLT 209  --  186  --  187  --  207  APTT  --  137*  --   --   --   --   --   LABPROT  --  14.8  --   --   --   --   --   INR  --  1.2  --   --   --   --   --   HEPARINUNFRC  --   --  <0.10*   < > 0.34 0.48 0.33  CREATININE 0.84  --  0.78  --  0.76  --  0.78  TROPONINIHS 167* 165*  --   --   --   --   --    < > = values in this interval not displayed.    Estimated Creatinine Clearance: 153.6 mL/min (by C-G formula based on SCr of 0.78 mg/dL).   Medical History: Past Medical History:  Diagnosis Date   Anemia    Arthritis    Back pain    CAD (coronary artery disease)    a. s/p DES to LAD in 05/2016   Cervical radiculopathy    Chronic diastolic CHF (congestive heart failure) (HCC)    Chronic pain    Depression    DVT (deep venous thrombosis) (HCC)    Hematemesis    Hepatic steatosis    Hyperlipidemia    Hypertension    IBS (irritable bowel syndrome)    Morbid obesity (HCC)    OSA  (obstructive sleep apnea)    Pancreatitis    PE (pulmonary thromboembolism) ([place])    Peripheral neuropathy    PUD (peptic ulcer disease)    Renal disorder    Stroke [facility])    a. ?details unclear - not seen on imaging when he was admitted in 05/2017 for TIA symptoms which were felt due to cervical radiculopathy.   Thoracic aortic ectasia (HCC)    a. 4.3cm ectatic ascending thoracic aorta by CT 06/2017.    Type 2 diabetes mellitus (HCC)     Medications:  Scheduled:   vitamin C  500 mg Oral Daily   aspirin  81 mg Oral Daily   atorvastatin  40 mg Oral QHS   carvedilol  12.5 mg Oral BID   famotidine  20 mg Oral Daily   fenofibrate  160 mg Oral Daily  furosemide  40 mg Oral TID   glipiZIDE  10 mg Oral BID   hydrocerin   Topical Daily   insulin aspart  0-20 Units Subcutaneous TID WC   insulin regular human CONCENTRATED  50 Units Subcutaneous TID WC   Ipratropium-Albuterol  1 puff Inhalation BID   pantoprazole  40 mg Oral Daily   potassium chloride  20 mEq Oral BID   sertraline  50 mg Oral Daily   traZODone  50 mg Oral QHS   zinc sulfate  220 mg Oral Daily   Infusions:   heparin 2,600 Units/hr (06/29/21 0654)   remdesivir 100 mg in NS 100 mL Stopped (06/28/21 1309)    Assessment: 60 yo male presented with worsening SOB and found to have PE on CTA in the ED 9/03 with moderate clot burden and evidence of right heart strain. PMH significant for CHF, history of DVT/PE (no AC PTA), and recent COVID infection.   Heparin level therapeutic prior to transitioning to Apixaban. CBC stable, no infusion problems or s/s bleeding noted per chart review. Will transition to Apixaban this morning.   Goal of Therapy:  Monitor platelets by anticoagulation protocol: Yes   Plan:  - Stop heparin gtt 9/06 @ 1000 - Start Apixaban '10mg'$  twice daily 9/06 @ 1000 x14 doses - Transition to apixaban '5mg'$  twice daily 9/13  - Continue to monitor H&H and platelets   Thank you for allowing pharmacy to be a  part of this patient's care.  Ardyth Harps, PharmD Clinical Pharmacist

## 2021-06-29 NOTE — Progress Notes (Signed)
Pt discharged from unit. Medication/discharge instruction given. Pt O2 tanks transported with pt and instructions given. Pt had no question   Phoebe Sharps, RN

## 2021-06-29 NOTE — Progress Notes (Signed)
Inpatient Diabetes Program Recommendations  AACE/ADA: New Consensus Statement on Inpatient Glycemic Control (2015)  Target Ranges:  Prepandial:   less than 140 mg/dL      Peak postprandial:   less than 180 mg/dL (1-2 hours)      Critically ill patients:  140 - 180 mg/dL   Lab Results  Component Value Date   GLUCAP 225 (H) 06/29/2021   HGBA1C 10.4 (H) 06/27/2021    Review of Glycemic Control Results for [name], [name] (MRN [id number]) as of 06/29/2021 12:34  Ref. Range 06/28/2021 15:44 06/28/2021 21:41 06/29/2021 05:58 06/29/2021 10:53  Glucose-Capillary Latest Ref Range: 70 - 99 mg/dL 261 (H) 231 (H) 206 (H) 225 (H)   Diabetes history: Type 2 DM Outpatient Diabetes medications: U-500 70-90 units TID, Glipizide 10 mg BID Current orders for Inpatient glycemic control: Novolog 0-20 units TID, U500 50 units TID  Inpatient Diabetes Program Recommendations:    Spoke with patient regarding outpatient diabetes management. Verified home dosages. Patient is followed by Peri Jefferson, NP for outpatient endocrinology and recently per notes received two steroid injections. Currently, being treated for Covid and PE. Reviewed patient's current A1c of 10.4%, up drom 8.8% in July 2022. Explained what a A1c is and what it measures. Also reviewed goal A1c with patient, importance of good glucose control @ home, and blood sugar goals. Reviewed patho of DM, role of pancreas, target goals, impact of steroids, increased risk for repeated PE event with poor glycemic control, impact of Covid, inpatient glucose trends and dosages of U-500, vascular changes and other commorbidities. Patient has a meter and checks 3 times per day. Education provided to patient on when to call MD. Patient encouraged to make another appointment with A. Jones. Has no further questions at this time.   Thanks, Bronson Curb, MSN, RNC-OB Diabetes Coordinator [phone] (8a-5p)

## 2021-06-29 NOTE — Progress Notes (Signed)
SATURATION QUALIFICATIONS: (This note is used to comply with regulatory documentation for home oxygen)  Patient Saturations on Room Air at Rest = 89%  Patient Saturations on Room Air while Ambulating = 86%  Patient Saturations on 3L Liters of oxygen while Ambulating = 95%  Pt SHOB while walking and O2 drops with out oxygen    Phoebe Sharps, RN

## 2021-06-29 NOTE — TOC Benefit Eligibility Note (Signed)
Patient Teacher, English as a foreign language completed.    The patient is currently admitted and upon discharge could be taking Eliquis 5 mg.  The current 30 day co-pay is, $4.00.   The patient is insured through [facility]    Lyndel Safe, [place] Patient Advocate Specialist [place] Team Direct Number: [phone]  Fax: [phone]

## 2021-06-29 NOTE — TOC Transition Note (Addendum)
Transition of Care (TOC) - CM/SW Discharge Note Marvetta Gibbons RN, BSN Transitions of Care Unit 4E- RN Case Manager See Treatment Team for direct phone #    Patient Details  Name: [name] MRN: [id number] Date of Birth: 05-11-1961  Transition of Care [facility]) CM/SW Contact:  Dawayne Patricia, RN Phone Number: 06/29/2021, 11:43 AM   Clinical Narrative:    Pt stable for transition home today. Spoke with pt via TC due to +COVID status. Discussed with pt home 02 needs and offered choice for DME agency- pt has used [facility] in past and is agreeable to using [employer] for home 02 needs. No other DME needs noted. Address confirmed with pt in epic Pt has been started on Eliquis which is a preferred drug with Medicaid- pt informed of this during TC conversation. Copay $4.   Call made to [facility] with [employer] for home 02 needs- [employer] to process DME request and deliver portable tank for transport home needs.    Final next level of care: Home/Self Care Barriers to Discharge: No Barriers Identified   Patient Goals and CMS Choice    Pt- DME needs    Discharge Placement               Home        Discharge Plan and Services   Discharge Planning Services: CM Consult Post Acute Care Choice: Durable Medical Equipment          DME Arranged: Oxygen DME Agency: [employer] Date DME Agency Contacted: 06/29/21 Time DME Agency Contacted: [phone] Representative spoke with at DME Agency: [place]: NA [place] Agency: NA        Social Determinants of Health ([place]) Interventions     Readmission Risk Interventions Readmission Risk Prevention Plan 06/29/2021  Transportation Screening Complete  Medication Review Press photographer) Complete  PCP or Specialist appointment within 3-5 days of discharge Complete  HRI or [place] Complete  SW Recovery Care/Counseling Consult Complete  [place] Not Applicable  Some recent data might  be hidden

## 2021-06-30 ENCOUNTER — Other Ambulatory Visit: Payer: Self-pay

## 2021-06-30 ENCOUNTER — Encounter (HOSPITAL_COMMUNITY): Payer: Self-pay

## 2021-06-30 ENCOUNTER — Inpatient Hospital Stay (HOSPITAL_COMMUNITY)
Admission: EM | Admit: 2021-06-30 | Discharge: 2021-07-03 | DRG: 377 | Disposition: A | Discharge status: Home / Self Care | Payer: Medicaid Other | Attending: Internal Medicine | Admitting: Internal Medicine

## 2021-06-30 ENCOUNTER — Emergency Department (HOSPITAL_COMMUNITY): Payer: Medicaid Other

## 2021-06-30 DIAGNOSIS — K5731 Diverticulosis of large intestine without perforation or abscess with bleeding: Secondary | ICD-10-CM | POA: Diagnosis present

## 2021-06-30 DIAGNOSIS — L97929 Non-pressure chronic ulcer of unspecified part of left lower leg with unspecified severity: Secondary | ICD-10-CM | POA: Diagnosis present

## 2021-06-30 DIAGNOSIS — I5032 Chronic diastolic (congestive) heart failure: Secondary | ICD-10-CM | POA: Diagnosis present

## 2021-06-30 DIAGNOSIS — Z803 Family history of malignant neoplasm of breast: Secondary | ICD-10-CM

## 2021-06-30 DIAGNOSIS — K625 Hemorrhage of anus and rectum: Secondary | ICD-10-CM | POA: Diagnosis present

## 2021-06-30 DIAGNOSIS — Z6841 Body Mass Index (BMI) 40.0 and over, adult: Secondary | ICD-10-CM

## 2021-06-30 DIAGNOSIS — E785 Hyperlipidemia, unspecified: Secondary | ICD-10-CM | POA: Diagnosis present

## 2021-06-30 DIAGNOSIS — E1165 Type 2 diabetes mellitus with hyperglycemia: Secondary | ICD-10-CM | POA: Diagnosis present

## 2021-06-30 DIAGNOSIS — D62 Acute posthemorrhagic anemia: Secondary | ICD-10-CM | POA: Diagnosis present

## 2021-06-30 DIAGNOSIS — K760 Fatty (change of) liver, not elsewhere classified: Secondary | ICD-10-CM | POA: Diagnosis present

## 2021-06-30 DIAGNOSIS — J9601 Acute respiratory failure with hypoxia: Secondary | ICD-10-CM | POA: Diagnosis present

## 2021-06-30 DIAGNOSIS — G4733 Obstructive sleep apnea (adult) (pediatric): Secondary | ICD-10-CM | POA: Diagnosis present

## 2021-06-30 DIAGNOSIS — Z86711 Personal history of pulmonary embolism: Secondary | ICD-10-CM

## 2021-06-30 DIAGNOSIS — I2699 Other pulmonary embolism without acute cor pulmonale: Secondary | ICD-10-CM | POA: Diagnosis present

## 2021-06-30 DIAGNOSIS — Z955 Presence of coronary angioplasty implant and graft: Secondary | ICD-10-CM | POA: Diagnosis not present

## 2021-06-30 DIAGNOSIS — F32A Depression, unspecified: Secondary | ICD-10-CM | POA: Diagnosis present

## 2021-06-30 DIAGNOSIS — Z86718 Personal history of other venous thrombosis and embolism: Secondary | ICD-10-CM

## 2021-06-30 DIAGNOSIS — Z833 Family history of diabetes mellitus: Secondary | ICD-10-CM

## 2021-06-30 DIAGNOSIS — K922 Gastrointestinal hemorrhage, unspecified: Secondary | ICD-10-CM | POA: Diagnosis present

## 2021-06-30 DIAGNOSIS — Z8249 Family history of ischemic heart disease and other diseases of the circulatory system: Secondary | ICD-10-CM | POA: Diagnosis not present

## 2021-06-30 DIAGNOSIS — Z9119 Patient's noncompliance with other medical treatment and regimen: Secondary | ICD-10-CM | POA: Diagnosis not present

## 2021-06-30 DIAGNOSIS — I110 Hypertensive heart disease with heart failure: Secondary | ICD-10-CM | POA: Diagnosis present

## 2021-06-30 DIAGNOSIS — T45515A Adverse effect of anticoagulants, initial encounter: Secondary | ICD-10-CM | POA: Diagnosis present

## 2021-06-30 DIAGNOSIS — Z794 Long term (current) use of insulin: Secondary | ICD-10-CM

## 2021-06-30 DIAGNOSIS — Z7982 Long term (current) use of aspirin: Secondary | ICD-10-CM

## 2021-06-30 DIAGNOSIS — Z7984 Long term (current) use of oral hypoglycemic drugs: Secondary | ICD-10-CM

## 2021-06-30 DIAGNOSIS — Z79899 Other long term (current) drug therapy: Secondary | ICD-10-CM

## 2021-06-30 DIAGNOSIS — Z7901 Long term (current) use of anticoagulants: Secondary | ICD-10-CM

## 2021-06-30 DIAGNOSIS — K219 Gastro-esophageal reflux disease without esophagitis: Secondary | ICD-10-CM | POA: Diagnosis present

## 2021-06-30 DIAGNOSIS — E11622 Type 2 diabetes mellitus with other skin ulcer: Secondary | ICD-10-CM | POA: Diagnosis present

## 2021-06-30 DIAGNOSIS — I2510 Atherosclerotic heart disease of native coronary artery without angina pectoris: Secondary | ICD-10-CM | POA: Diagnosis present

## 2021-06-30 DIAGNOSIS — Z8616 Personal history of COVID-19: Secondary | ICD-10-CM | POA: Diagnosis not present

## 2021-06-30 DIAGNOSIS — Z8673 Personal history of transient ischemic attack (TIA), and cerebral infarction without residual deficits: Secondary | ICD-10-CM

## 2021-06-30 DIAGNOSIS — I509 Heart failure, unspecified: Secondary | ICD-10-CM

## 2021-06-30 DIAGNOSIS — D6832 Hemorrhagic disorder due to extrinsic circulating anticoagulants: Secondary | ICD-10-CM | POA: Diagnosis present

## 2021-06-30 DIAGNOSIS — D649 Anemia, unspecified: Secondary | ICD-10-CM

## 2021-06-30 LAB — COMPREHENSIVE METABOLIC PANEL
ALT: 23 U/L (ref 0–44)
AST: 35 U/L (ref 15–41)
Albumin: 3.3 g/dL — ABNORMAL LOW (ref 3.5–5.0)
Alkaline Phosphatase: 47 U/L (ref 38–126)
Anion gap: 7 (ref 5–15)
BUN: 12 mg/dL (ref 6–20)
CO2: 29 mmol/L (ref 22–32)
Calcium: 8.8 mg/dL — ABNORMAL LOW (ref 8.9–10.3)
Chloride: 103 mmol/L (ref 98–111)
Creatinine, Ser: 0.61 mg/dL (ref 0.61–1.24)
GFR, Estimated: >60 mL/min (ref >60)
Glucose, Bld: 223 mg/dL — ABNORMAL HIGH (ref 70–99)
Potassium: 3.4 mmol/L — ABNORMAL LOW (ref 3.5–5.1)
Sodium: 139 mmol/L (ref 135–145)
Total Bilirubin: 0.9 mg/dL (ref 0.3–1.2)
Total Protein: 6.7 g/dL (ref 6.5–8.1)

## 2021-06-30 LAB — CBC
HCT: 33.6 % — ABNORMAL LOW (ref 39.0–52.0)
Hemoglobin: 11 g/dL — ABNORMAL LOW (ref 13.0–17.0)
MCH: 31.5 pg (ref 26.0–34.0)
MCHC: 32.7 g/dL (ref 30.0–36.0)
MCV: 96.3 fL (ref 80.0–100.0)
Platelets: 217 10*3/uL (ref 150–400)
RBC: 3.49 MIL/uL — ABNORMAL LOW (ref 4.22–5.81)
RDW: 14.4 % (ref 11.5–15.5)
WBC: 9.4 10*3/uL (ref 4.0–10.5)
nRBC: 0.4 % — ABNORMAL HIGH (ref 0.0–0.2)

## 2021-06-30 LAB — PROTIME-INR
INR: 1.5 — ABNORMAL HIGH (ref 0.8–1.2)
Prothrombin Time: 18.5 seconds — ABNORMAL HIGH (ref 11.4–15.2)

## 2021-06-30 LAB — POC OCCULT BLOOD, ED: Fecal Occult Bld: POSITIVE — AB

## 2021-06-30 LAB — TYPE AND SCREEN
ABO/RH(D): O POS
Antibody Screen: NEGATIVE

## 2021-06-30 LAB — GLUCOSE, CAPILLARY: Glucose-Capillary: 154 mg/dL — ABNORMAL HIGH (ref 70–99)

## 2021-06-30 MED ORDER — INSULIN ASPART 100 UNIT/ML IJ SOLN
0.0000 [IU] | Freq: Three times a day (TID) | INTRAMUSCULAR | Status: DC
  Administered 2021-07-01: 3 [IU] via SUBCUTANEOUS
  Administered 2021-07-01: 5 [IU] via SUBCUTANEOUS
  Administered 2021-07-01 – 2021-07-02 (×3): 8 [IU] via SUBCUTANEOUS
  Administered 2021-07-02: 5 [IU] via SUBCUTANEOUS
  Administered 2021-07-03: 11 [IU] via SUBCUTANEOUS
  Administered 2021-07-03: 5 [IU] via SUBCUTANEOUS
  Filled 2021-06-30: qty 0.15

## 2021-06-30 MED ORDER — CARVEDILOL 12.5 MG PO TABS
12.5000 mg | ORAL_TABLET | Freq: Two times a day (BID) | ORAL | Status: DC
  Administered 2021-06-30 – 2021-07-03 (×6): 12.5 mg via ORAL
  Filled 2021-06-30 (×6): qty 1

## 2021-06-30 MED ORDER — FUROSEMIDE 10 MG/ML IJ SOLN
40.0000 mg | Freq: Once | INTRAMUSCULAR | Status: AC
  Administered 2021-06-30: 40 mg via INTRAVENOUS
  Filled 2021-06-30: qty 4

## 2021-06-30 MED ORDER — SODIUM CHLORIDE 0.9 % IV SOLN: INTRAVENOUS | Status: DC

## 2021-06-30 MED ORDER — SERTRALINE HCL 50 MG PO TABS
50.0000 mg | ORAL_TABLET | Freq: Every day | ORAL | Status: DC
  Administered 2021-07-01 – 2021-07-03 (×3): 50 mg via ORAL
  Filled 2021-06-30 (×3): qty 1

## 2021-06-30 MED ORDER — ISOSORBIDE MONONITRATE ER 30 MG PO TB24
30.0000 mg | ORAL_TABLET | Freq: Every day | ORAL | Status: DC
  Administered 2021-07-01 – 2021-07-03 (×3): 30 mg via ORAL
  Filled 2021-06-30 (×3): qty 1

## 2021-06-30 MED ORDER — FENOFIBRATE 160 MG PO TABS
160.0000 mg | ORAL_TABLET | Freq: Every day | ORAL | Status: DC
  Administered 2021-07-01 – 2021-07-03 (×3): 160 mg via ORAL
  Filled 2021-06-30 (×3): qty 1

## 2021-06-30 MED ORDER — FUROSEMIDE 40 MG PO TABS
40.0000 mg | ORAL_TABLET | Freq: Every day | ORAL | Status: DC
  Administered 2021-07-01 – 2021-07-03 (×3): 40 mg via ORAL
  Filled 2021-06-30 (×3): qty 1

## 2021-06-30 MED ORDER — LORAZEPAM 0.5 MG PO TABS
0.5000 mg | ORAL_TABLET | Freq: Once | ORAL | Status: AC
  Administered 2021-06-30: 0.5 mg via ORAL
  Filled 2021-06-30: qty 1

## 2021-06-30 MED ORDER — INSULIN ASPART 100 UNIT/ML IJ SOLN
0.0000 [IU] | Freq: Every day | INTRAMUSCULAR | Status: DC
  Administered 2021-07-02: 2 [IU] via SUBCUTANEOUS
  Filled 2021-06-30: qty 0.05

## 2021-06-30 MED ORDER — TRAZODONE HCL 50 MG PO TABS
50.0000 mg | ORAL_TABLET | Freq: Every day | ORAL | Status: DC
  Administered 2021-06-30 – 2021-07-02 (×3): 50 mg via ORAL
  Filled 2021-06-30 (×3): qty 1

## 2021-06-30 MED ORDER — FAMOTIDINE 20 MG PO TABS
20.0000 mg | ORAL_TABLET | Freq: Every day | ORAL | Status: DC
  Administered 2021-07-01 – 2021-07-03 (×3): 20 mg via ORAL
  Filled 2021-06-30 (×3): qty 1

## 2021-06-30 MED ORDER — POTASSIUM CHLORIDE CRYS ER 10 MEQ PO TBCR
10.0000 meq | EXTENDED_RELEASE_TABLET | Freq: Every day | ORAL | Status: DC
  Administered 2021-07-01 – 2021-07-03 (×3): 10 meq via ORAL
  Filled 2021-06-30 (×3): qty 1

## 2021-06-30 MED ORDER — ONDANSETRON HCL 4 MG PO TABS
4.0000 mg | ORAL_TABLET | Freq: Two times a day (BID) | ORAL | Status: DC | PRN
  Administered 2021-07-01: 4 mg via ORAL
  Filled 2021-06-30: qty 1

## 2021-06-30 MED ORDER — PANTOPRAZOLE SODIUM 40 MG PO TBEC
40.0000 mg | DELAYED_RELEASE_TABLET | Freq: Every day | ORAL | Status: DC
  Administered 2021-07-01 – 2021-07-03 (×3): 40 mg via ORAL
  Filled 2021-06-30 (×3): qty 1

## 2021-06-30 MED ORDER — LISINOPRIL 5 MG PO TABS
5.0000 mg | ORAL_TABLET | Freq: Every day | ORAL | Status: DC
  Administered 2021-07-01 – 2021-07-03 (×3): 5 mg via ORAL
  Filled 2021-06-30 (×3): qty 1

## 2021-06-30 MED ORDER — ATORVASTATIN CALCIUM 40 MG PO TABS
40.0000 mg | ORAL_TABLET | Freq: Every day | ORAL | Status: DC
  Administered 2021-06-30 – 2021-07-02 (×3): 40 mg via ORAL
  Filled 2021-06-30 (×3): qty 1

## 2021-06-30 MED ORDER — TIZANIDINE HCL 4 MG PO TABS: 4.0000 mg | ORAL_TABLET | Freq: Every evening | ORAL | Status: DC | PRN

## 2021-06-30 NOTE — H&P (Signed)
History and Physical    DELOREAN DUFFY [id number] DOB: [birth year] DOA: 06/30/2021  PCP: [name], FNP   Chief Complaint: BRBPR  HPI:  [name] is a 60 year old male with past medical history significant for type 2 diabetes mellitus, essential hypertension, OSA not on CPAP, chronic diastolic congestive heart failure and morbid obesity history of recurrent recent acute PE discharged 06/29/21 on eliquis after being evaluated for acute hypoxia and chest pain. Unfortunately over the past 24h patient began having bright red stool and presented back to the hospital as directed at the time of discharge should he notice any abnormal bleeding.  Previous hospitalization 9/3 - 9/6 with progressive shortness of breath, respiratory distress and hypoxia with notable recurrent acute PE and notable recent covid viral infection now outside the window for precautions/treatment per CDC guidelines.  ED Course: [facility] consulted - recommending cessation of anticoagulation and will re-evaluate in the next 24h.  Review of Systems: As per HPI .   Assessment/Plan  Acute GI bleed in the setting of anticoagulation - GI consulted and continues to follow - appreciate insight and recs - Hgb stable from previous hospitalization earlier this week - Follow repeat labs  Acute hypoxic respiratory failure, POA Acute pulmonary embolism, recurrent - Stable from previous hospitalization - Holding anticoagulation per GI given active bleed - CT angiogram chest with pulmonary embolism distal right main pulmonary artery extending into the upper/middle/lower lobar branches with subtotally occlusion with RV strain  - History of previous PE/DVT 8 years ago and completed anticoagulation course without incident - Requiring 3 L per nasal cannula while ambulating since last admission - Recommend hypercoagulable work-up outpatient   COVID-19 viral infection - Patient reports recent diagnosis > 14 days ago - No  longer requires PPE/quarantine per CDC guidelines   Essential hypertension - Continue home meds - carvedilol 12.5 mg p.o. twice daily, isosorbide mononitrate 30 mg p.o. daily, lisinopril 5 mg p.o. daily, furosemide 40 mg TID    Dyslipidemia - Continue atorvastatin 40 mg p.o. daily and fenofibrate 160 mg p.o. daily   Uncontrolled type 2 diabetes mellitus - Hemoglobin A1c 10.8 10/15/2020 - Continue sliding scale/diabetic diet   Chronic diastolic congestive heart failure Continue furosemide, carvedilol   OSA Noncompliance with CPAP outpatient.  Recommend repeat sleep study and counseling on need for CPAP.   Left lower extremity ulceration malleolus, POA Evaluated by wound care. Cleanse with soap and water, Eucerin cream covered with antimicrobial nonadherent Xeroform gauze with ABD pad and secured with Kerlix roll per last hospitalization   GERD: Continue PPI   Depression: Sertraline 50 mg p.o. daily   Morbid obesity Body mass index is 49.35 kg/m.  Discussed with patient needs for aggressive lifestyle changes/weight loss as this complicates all facets of care.  Outpatient follow-up with PCP.  May benefit from bariatric evaluation outpatient.  DVT prophylaxis: None, actively bleeding as above Code Status: Full Family Communication: None present Status is: Inpatient  Dispo: The patient is from: Home              Anticipated d/c is to: Home              Anticipated d/c date is: 48 to 72 hours pending clinical course              Patient currently not medically stable for discharge given ongoing active GI bleed in the setting of anticoagulation  Consultants:  [facility], [facility]  Procedures:  None planned, defer to GI as above  Past Medical History:  Diagnosis Date   Anemia    Arthritis    Back pain    CAD (coronary artery disease)    a. s/p DES to LAD in 05/2016   Cervical radiculopathy    Chronic diastolic CHF (congestive heart failure) (HCC)    Chronic pain    Depression     DVT (deep venous thrombosis) (HCC)    Hematemesis    Hepatic steatosis    Hyperlipidemia    Hypertension    IBS (irritable bowel syndrome)    Morbid obesity (HCC)    OSA (obstructive sleep apnea)    Pancreatitis    PE (pulmonary thromboembolism) ([place])    Peripheral neuropathy    PUD (peptic ulcer disease)    Renal disorder    Stroke [facility])    a. ?details unclear - not seen on imaging when he was admitted in 05/2017 for TIA symptoms which were felt due to cervical radiculopathy.   Thoracic aortic ectasia (HCC)    a. 4.3cm ectatic ascending thoracic aorta by CT 06/2017.    Type 2 diabetes mellitus ([place])     Past Surgical History:  Procedure Laterality Date   CARDIAC CATHETERIZATION N/A 05/31/2016   Procedure: Left Heart Cath and Coronary Angiography;  Surgeon: Peter M Martinique, MD;  Location: [place] [facility];  Service: Cardiovascular;  Laterality: N/A;   CARDIAC CATHETERIZATION N/A 05/31/2016   Procedure: Intravascular Pressure Wire/FFR Study;  Surgeon: Peter M Martinique, MD;  Location: [place] [facility];  Service: Cardiovascular;  Laterality: N/A;   CARDIAC CATHETERIZATION N/A 05/31/2016   Procedure: Coronary Stent Intervention;  Surgeon: Peter M Martinique, MD;  Location: [place] [facility];  Service: Cardiovascular;  Laterality: N/A;   COLONOSCOPY WITH PROPOFOL N/A 04/29/2020   Procedure: COLONOSCOPY WITH PROPOFOL;  Surgeon: Wilford Corner, MD;  Location: [place];  Service: Endoscopy;  Laterality: N/A;   ESOPHAGOGASTRODUODENOSCOPY N/A 04/29/2020   Procedure: ESOPHAGOGASTRODUODENOSCOPY (EGD);  Surgeon: Wilford Corner, MD;  Location: [place];  Service: Endoscopy;  Laterality: N/A;   LEFT HEART CATH AND CORONARY ANGIOGRAPHY N/A 12/08/2017   Procedure: LEFT HEART CATH AND CORONARY ANGIOGRAPHY;  Surgeon: Leonie Man, MD;  Location: [place] [facility];  Service: Cardiovascular;  Laterality: N/A;   LEFT HEART CATHETERIZATION WITH CORONARY ANGIOGRAM N/A 02/03/2014   Procedure: LEFT  HEART CATHETERIZATION WITH CORONARY ANGIOGRAM;  Surgeon: Pixie Casino, MD;  Location: [facility] CATH LAB;  Service: Cardiovascular;  Laterality: N/A;   left leg stent      POLYPECTOMY  04/29/2020   Procedure: POLYPECTOMY;  Surgeon: Wilford Corner, MD;  Location: [facility] ENDOSCOPY;  Service: Endoscopy;;     reports that he has never smoked. He has never used smokeless tobacco. He reports that he does not drink alcohol and does not use drugs.  Allergies  Allergen Reactions   Coconut Flavor [Flavoring Agent] Hives   Coconut Oil Hives   Ibuprofen Other (See Comments)    Made gastric ulcers worse   Aleve [Naproxen] Other (See Comments)    DUE TO KIDNEYS   Nsaids Other (See Comments)    Stomach ulcers    Vascepa [Icosapent Ethyl] Other (See Comments)    headaches, chest pain, similar to sx of a stroke, hypotension     Family History  Problem Relation Age of Onset   Cancer Father    Hypertension Mother    Diabetes Mother    Breast cancer Mother    Hypertension Brother    Diabetes Brother  Hypertension Sister    Diabetes Sister     Prior to Admission medications   Medication Sig Start Date End Date Taking? Authorizing Provider  albuterol (VENTOLIN HFA) 108 (90 Base) MCG/ACT inhaler Inhale 2 puffs into the lungs every 6 (six) hours as needed for wheezing or shortness of breath. As Directed   Yes [provider]  apixaban (ELIQUIS) 5 MG TABS tablet Take 2 tablets ('10mg'$ ) twice daily for 7 days, then 1 tablet ('5mg'$ ) twice daily Patient taking differently: Take 5 mg by mouth 2 (two) times daily. 06/29/21  Yes [place] Donnamarie Poag, DO  aspirin 81 MG chewable tablet Chew 1 tablet (81 mg total) by mouth daily. 06/01/16  Yes Elwin Mocha, MD  atorvastatin (LIPITOR) 40 MG tablet Take 40 mg by mouth at bedtime.   Yes [provider]  carvedilol (COREG) 12.5 MG tablet Take 12.5 mg by mouth 2 (two) times daily. 10/22/20  Yes [provider]  famotidine (PEPCID) 20 MG tablet Take  20 mg by mouth daily. 03/05/20  Yes [provider]  fenofibrate (TRICOR) 145 MG tablet Take 1 tablet (145 mg total) by mouth daily. 06/25/21  Yes Cleaver, Jossie Ng, NP  furosemide (LASIX) 40 MG tablet Take 1 tablet (40 mg total) by mouth daily. PLEASE RESUME ONLY FROM 10/21/2020. DOSE HAS BEEN CHANGED. Patient taking differently: Take 40 mg by mouth 3 (three) times daily. 10/21/20 10/21/21 Yes Bonnielee Haff, MD  glipiZIDE (GLUCOTROL) 10 MG tablet Take 10 mg by mouth 2 (two) times daily. 05/30/17  Yes [provider]  HUMULIN R U-500 KWIKPEN 500 UNIT/ML kwikpen Inject 80 Units into the skin See admin instructions. Inject under the skin three times daily as directed or sliding scale: 06/04/20  Yes [provider]  ipratropium-albuterol (DUONEB) 0.5-2.5 (3) MG/3ML SOLN Take 3 mLs by nebulization 2 (two) times daily. 06/24/21  Yes [provider]  isosorbide mononitrate (IMDUR) 30 MG 24 hr tablet TAKE 1 TABLET BY MOUTH DAILY Patient taking differently: Take 30 mg by mouth daily. 12/24/20  Yes Hilty, Nadean Corwin, MD  lisinopril (ZESTRIL) 5 MG tablet Take 5 mg by mouth daily. 12/04/20  Yes [provider]  nitroGLYCERIN (NITROSTAT) 0.4 MG SL tablet Place 1 tablet (0.4 mg total) under the tongue every 5 (five) minutes as needed for chest pain. 02/06/19  Yes Skeet Latch, MD  ondansetron (ZOFRAN) 4 MG tablet Take 4 mg by mouth 2 (two) times daily as needed for nausea/vomiting. 04/21/20  Yes [provider]  Oxycodone HCl 20 MG TABS Take 20 tablets by mouth 4 (four) times daily as needed for pain. LF on 05-31-21 # 120 05/31/21  Yes [provider]  pantoprazole (PROTONIX) 40 MG tablet Take 40 mg by mouth daily. 04/13/21  Yes [provider]  potassium chloride (MICRO-K) 10 MEQ CR capsule Take 20 mEq by mouth daily. 04/03/20  Yes [provider]  sertraline (ZOLOFT) 50 MG tablet Take 50 mg by mouth daily. 12/17/20  Yes [provider]   tiZANidine (ZANAFLEX) 4 MG tablet Take 4 mg by mouth at bedtime as needed for muscle spasms. 03/31/20  Yes [provider]  traZODone (DESYREL) 50 MG tablet Take 1 tablet (50 mg total) by mouth at bedtime. 12/15/16  Yes Burns, Arloa Koh, MD  Vitamin D, Ergocalciferol, (DRISDOL) 1.25 MG (50000 UT) CAPS capsule Take 50,000 Units by mouth every Monday. 08/15/19  Yes [provider]  Accu-Chek FastClix Lancets MISC 1 Device by Other route 2 (two)  times daily.  03/20/19   [provider]  ACCU-CHEK GUIDE test strip 1 each by Other route 2 (two) times daily. 01/08/21   [provider]  Insulin Pen Needle 31G X 5 MM MISC Use 1 needle daily to inject insulin as prescribed Patient taking differently: 1 each. Use 1 needle daily to inject insulin as prescribed 06/18/17   Barton Dubois, MD    Physical Exam: Vitals:   06/30/21 1615 06/30/21 1630 06/30/21 1645 06/30/21 1715  BP: (!) 117/57 (!) 153/67 (!) 87/69 104/73  Pulse: 77 74 74 75  Resp: 15 (!) '21 12 20  '$ Temp:      TempSrc:      SpO2: 96% 100% 97% 98%    Constitutional: NAD, calm, comfortable Vitals:   06/30/21 1615 06/30/21 1630 06/30/21 1645 06/30/21 1715  BP: (!) 117/57 (!) 153/67 (!) 87/69 104/73  Pulse: 77 74 74 75  Resp: 15 (!) '21 12 20  '$ Temp:      TempSrc:      SpO2: 96% 100% 97% 98%   General:  Pleasantly resting in bed, No acute distress. HEENT:  Normocephalic atraumatic.  Sclerae nonicteric, noninjected.  Extraocular movements intact bilaterally. Neck:  Without mass or deformity.  Trachea is midline. Lungs:  Clear to auscultate bilaterally without rhonchi, wheeze, or rales. Heart:  Regular rate and rhythm.  Without murmurs, rubs, or gallops. Abdomen:  Soft, nontender, nondistended.  Without guarding or rebound. Extremities: Without cyanosis, clubbing, edema, or obvious deformity. Vascular:  Dorsalis pedis and posterior tibial pulses palpable bilaterally. Skin:  Warm and dry, no erythema, left  lower extremity malleoli are ulceration  Labs on Admission: I have personally reviewed following labs and imaging studies  CBC: Recent Labs  Lab 06/26/21 1139 06/27/21 0142 06/28/21 0045 06/29/21 0107 06/30/21 1545  WBC 10.5 9.4 10.1 11.0* 9.4  HGB 12.9* 12.2* 12.1* 12.3* 11.0*  HCT 39.1 37.0* 36.4* 37.3* 33.6*  MCV 95.1 94.4 94.3 95.4 96.3  PLT 209 186 187 207 [id number]   Basic Metabolic Panel: Recent Labs  Lab 06/26/21 1139 06/27/21 0142 06/28/21 0045 06/29/21 0107 06/30/21 1545  NA 138 137 136 136 139  K 3.7 3.4* 3.5 3.7 3.4*  CL 98 100 101 99 103  CO2 '28 24 24 30 29  '$ GLUCOSE 227* 232* 247* 202* 223*  BUN '13 11 8 '$ 5* 12  CREATININE 0.84 0.78 0.76 0.78 0.61  CALCIUM 9.3 8.8* 8.6* 8.4* 8.8*  MG  --   --  1.8  --   --    GFR: Estimated Creatinine Clearance: 153.6 mL/min (by C-G formula based on SCr of 0.61 mg/dL). Liver Function Tests: Recent Labs  Lab 06/27/21 0142 06/28/21 0045 06/29/21 0107 06/30/21 1545  AST 37 28 23 35  ALT '30 28 26 23  '$ ALKPHOS 45 45 48 47  BILITOT 0.9 0.9 0.7 0.9  PROT 6.5 6.6 6.7 6.7  ALBUMIN 3.2* 3.3* 3.3* 3.3*   No results for input(s): LIPASE, AMYLASE in the last 168 hours. No results for input(s): AMMONIA in the last 168 hours. Coagulation Profile: Recent Labs  Lab 06/26/21 2001 06/30/21 1545  INR 1.2 1.5*   Cardiac Enzymes: No results for input(s): CKTOTAL, CKMB, CKMBINDEX, TROPONINI in the last 168 hours. BNP (last 3 results) No results for input(s): PROBNP in the last 8760 hours. HbA1C: No results for input(s): HGBA1C in the last 72 hours. CBG: Recent Labs  Lab 06/28/21 1544 06/28/21 2141 06/29/21 0558 06/29/21 1053 06/29/21 1247  GLUCAP 261* 231*  206* 225* 214*   Lipid Profile: No results for input(s): CHOL, HDL, LDLCALC, TRIG, CHOLHDL, LDLDIRECT in the last 72 hours. Thyroid Function Tests: No results for input(s): TSH, T4TOTAL, FREET4, T3FREE, THYROIDAB in the last 72 hours. Anemia Panel: No results for  input(s): VITAMINB12, FOLATE, FERRITIN, TIBC, IRON, RETICCTPCT in the last 72 hours. Urine analysis:    Component Value Date/Time   COLORURINE COLORLESS (A) 06/05/2021 1422   APPEARANCEUR CLEAR 06/05/2021 1422   LABSPEC 1.005 06/05/2021 1422   PHURINE 7.0 06/05/2021 1422   GLUCOSEU 150 (A) 06/05/2021 1422   HGBUR SMALL (A) 06/05/2021 1422   BILIRUBINUR NEGATIVE 06/05/2021 1422   KETONESUR NEGATIVE 06/05/2021 1422   PROTEINUR NEGATIVE 06/05/2021 1422   UROBILINOGEN 0.2 05/27/2014 2312   NITRITE NEGATIVE 06/05/2021 1422   LEUKOCYTESUR NEGATIVE 06/05/2021 1422    Radiological Exams on Admission: DG Chest Portable 1 View  Result Date: 06/30/2021 CLINICAL DATA:  Shortness of breath. EXAM: PORTABLE CHEST 1 VIEW COMPARISON:  Chest x-ray 06/26/2021.  CT chest 06/26/2021. FINDINGS: The heart is mildly enlarged. There is central pulmonary vascular congestion. There is no focal lung consolidation, pleural effusion or pneumothorax. No acute fractures are seen. IMPRESSION: 1. Cardiomegaly with central pulmonary vascular congestion. Electronically Signed   By: Ronney Asters M.D.   On: 06/30/2021 16:56     [facility] For contact please use secure messenger on Epic  If 7PM-7AM, please contact night-coverage located on [email or web address]   06/30/2021, 5:30 PM

## 2021-06-30 NOTE — Progress Notes (Signed)
Delay in accepting patient because I requested an updated temp b/c last temp was at 06/30/2021 1448.

## 2021-06-30 NOTE — ED Provider Notes (Signed)
[place] DEPT Provider Note   CSN: [id number] Arrival date & time: 06/30/21  1433     History Chief Complaint  Patient presents with   Rectal Bleeding    [name] is a 60 y.o. male.  Pt presents to the ED today with rectal bleeding.  Pt was admitted from 9/3-9/6 for an acute PE with Covid.  Pt was placed on heparin initially and then transitioned to Eliquis.  He was d/c with 3L oxygen via [place].  Pt was diagnosed with Covid 11 days prior to the admission.  He has been vaccinated + booster.  Pt did receive remdesivir during his hospital stay.  Pt did have some nose bleeds while in the hospital, but they stopped on their own.  Pt started having gross rectal bleeding this morning.  He feels sob as well.        Past Medical History:  Diagnosis Date   Anemia    Arthritis    Back pain    CAD (coronary artery disease)    a. s/p DES to LAD in 05/2016   Cervical radiculopathy    Chronic diastolic CHF (congestive heart failure) (HCC)    Chronic pain    Depression    DVT (deep venous thrombosis) (HCC)    Hematemesis    Hepatic steatosis    Hyperlipidemia    Hypertension    IBS (irritable bowel syndrome)    Morbid obesity (HCC)    OSA (obstructive sleep apnea)    Pancreatitis    PE (pulmonary thromboembolism) ([place])    Peripheral neuropathy    PUD (peptic ulcer disease)    Renal disorder    Stroke [facility])    a. ?details unclear - not seen on imaging when he was admitted in 05/2017 for TIA symptoms which were felt due to cervical radiculopathy.   Thoracic aortic ectasia (HCC)    a. 4.3cm ectatic ascending thoracic aorta by CT 06/2017.    Type 2 diabetes mellitus ([place])     Patient Active Problem List   Diagnosis Date Noted   Acute pulmonary embolism ([place]) 06/26/2021   Diabetic ulcer of lower leg ([place]) 06/26/2021   Acute respiratory failure with hypoxia ([place]) 06/26/2021   COVID-19 virus infection 06/26/2021   Lumbar radiculitis 03/30/2021    Morbid obesity ([place]) 01/15/2021   Personal history of colonic polyps 01/15/2021   Rectal bleeding 01/15/2021   Pain due to onychomycosis of toenails of both feet 01/15/2021   Epistaxis, recurrent 10/21/2020   Laceration of nose 10/21/2020   Syncope 10/15/2020   OSA (obstructive sleep apnea)    Bilateral carotid artery stenosis 07/28/2020   Bilateral lower extremity edema 07/28/2020   CHF (congestive heart failure) ([place]) 07/28/2020   DDD (degenerative disc disease), lumbar 07/28/2020   Diabetic peripheral neuropathy ([place]) 07/28/2020   Fatty liver 07/28/2020   Irritable bowel syndrome with diarrhea 07/28/2020   Osteoarthritis of right hip 07/28/2020   Upper GI bleed 04/28/2020   Hypertensive urgency 04/27/2020   Acute GI bleeding 04/27/2020   Mixed diabetic hyperlipidemia associated with type 2 diabetes mellitus ([place]) 04/27/2020   Rotator cuff arthropathy 10/30/2019   Cervical radiculopathy 09/24/2019   Trochanteric bursitis of right hip 09/24/2019   Chronic diastolic CHF (congestive heart failure) ([place]) 08/23/2019   ARF (acute renal failure) ([place]) 08/01/2019   Hyperkalemia 08/01/2019   Uncontrolled type 2 diabetes mellitus with hyperglycemia ([place]) 08/01/2019   AKI (acute kidney injury) ([place])    Hypotension 07/31/2019   DKA (  diabetic ketoacidosis) ([place]) 05/05/2019   Blurred vision 05/05/2019   Trochanteric bursitis 02/11/2019   Long-term insulin use ([place]) 01/25/2019   Alteration consciousness 11/19/2018   Neck pain 10/09/2018   Left arm weakness 10/09/2018   B12 deficiency 08/10/2017   Persistent headaches 08/09/2017   GERD (gastroesophageal reflux disease) 06/29/2017   Left arm numbness    Cerebral embolism with cerebral infarction 06/17/2017   TIA (transient ischemic attack) 06/17/2017   Chronic back pain 12/29/2016   Depression 12/15/2016   Vitamin D deficiency 12/09/2016   Right hip pain 12/07/2016   Hypokalemia 12/07/2016   Type 2 diabetes mellitus with vascular  disease ([place]) 05/31/2016   Normocytic normochromic anemia 05/31/2016   Chest pain 05/31/2016   Coronary artery disease involving native coronary artery of native heart without angina pectoris 01/06/2016   Lactic acidosis 05/28/2014   Nonspecific chest pain 01/29/2014   Uncontrolled secondary diabetes with peripheral neuropathy ([place]) 01/29/2014   Obesity, Class III, BMI 40-49.9 (morbid obesity) ([place]) 01/29/2014   Snoring 01/29/2014   Dyslipidemia 01/29/2014   HTN (hypertension) 01/29/2014   Abnormal nuclear stress test 01/29/2014    Past Surgical History:  Procedure Laterality Date   CARDIAC CATHETERIZATION N/A 05/31/2016   Procedure: Left Heart Cath and Coronary Angiography;  Surgeon: Peter M Martinique, MD;  Location: [facility];  Service: Cardiovascular;  Laterality: N/A;   CARDIAC CATHETERIZATION N/A 05/31/2016   Procedure: Intravascular Pressure Wire/FFR Study;  Surgeon: Peter M Martinique, MD;  Location: [facility];  Service: Cardiovascular;  Laterality: N/A;   CARDIAC CATHETERIZATION N/A 05/31/2016   Procedure: Coronary Stent Intervention;  Surgeon: Peter M Martinique, MD;  Location: [place];  Service: Cardiovascular;  Laterality: N/A;   COLONOSCOPY WITH PROPOFOL N/A 04/29/2020   Procedure: COLONOSCOPY WITH PROPOFOL;  Surgeon: Wilford Corner, MD;  Location: [facility];  Service: Endoscopy;  Laterality: N/A;   ESOPHAGOGASTRODUODENOSCOPY N/A 04/29/2020   Procedure: ESOPHAGOGASTRODUODENOSCOPY (EGD);  Surgeon: Wilford Corner, MD;  Location: [facility];  Service: Endoscopy;  Laterality: N/A;   LEFT HEART CATH AND CORONARY ANGIOGRAPHY N/A 12/08/2017   Procedure: LEFT HEART CATH AND CORONARY ANGIOGRAPHY;  Surgeon: Leonie Man, MD;  Location: [facility];  Service: Cardiovascular;  Laterality: N/A;   LEFT HEART CATHETERIZATION WITH CORONARY ANGIOGRAM N/A 02/03/2014   Procedure: LEFT HEART CATHETERIZATION WITH CORONARY ANGIOGRAM;  Surgeon: Pixie Casino, MD;   Location: [facility] CATH LAB;  Service: Cardiovascular;  Laterality: N/A;   left leg stent      POLYPECTOMY  04/29/2020   Procedure: POLYPECTOMY;  Surgeon: Wilford Corner, MD;  Location: [facility] ENDOSCOPY;  Service: Endoscopy;;       Family History  Problem Relation Age of Onset   Cancer Father    Hypertension Mother    Diabetes Mother    Breast cancer Mother    Hypertension Brother    Diabetes Brother    Hypertension Sister    Diabetes Sister     Social History   Tobacco Use   Smoking status: Never   Smokeless tobacco: Never  Vaping Use   Vaping Use: Never used  Substance Use Topics   Alcohol use: No   Drug use: No    Home Medications Prior to Admission medications   Medication Sig Start Date End Date Taking? Authorizing Provider  albuterol (VENTOLIN HFA) 108 (90 Base) MCG/ACT inhaler Inhale 2 puffs into the lungs every 6 (six) hours as needed for wheezing or shortness of breath. As Directed  Yes [provider]  apixaban (ELIQUIS) 5 MG TABS tablet Take 2 tablets ('10mg'$ ) twice daily for 7 days, then 1 tablet ('5mg'$ ) twice daily Patient taking differently: Take 5 mg by mouth 2 (two) times daily. 06/29/21  Yes [place] Donnamarie Poag, DO  aspirin 81 MG chewable tablet Chew 1 tablet (81 mg total) by mouth daily. 06/01/16  Yes Elwin Mocha, MD  atorvastatin (LIPITOR) 40 MG tablet Take 40 mg by mouth at bedtime.   Yes [provider]  carvedilol (COREG) 12.5 MG tablet Take 12.5 mg by mouth 2 (two) times daily. 10/22/20  Yes [provider]  famotidine (PEPCID) 20 MG tablet Take 20 mg by mouth daily. 03/05/20  Yes [provider]  fenofibrate (TRICOR) 145 MG tablet Take 1 tablet (145 mg total) by mouth daily. 06/25/21  Yes Cleaver, Jossie Ng, NP  glipiZIDE (GLUCOTROL) 10 MG tablet Take 10 mg by mouth 2 (two) times daily. 05/30/17  Yes [provider]  HUMULIN R U-500 KWIKPEN 500 UNIT/ML kwikpen Inject 80 Units into the skin See admin instructions. Inject under  the skin three times daily as directed or sliding scale: 06/04/20  Yes [provider]  ipratropium-albuterol (DUONEB) 0.5-2.5 (3) MG/3ML SOLN Take 3 mLs by nebulization 2 (two) times daily. 06/24/21  Yes [provider]  isosorbide mononitrate (IMDUR) 30 MG 24 hr tablet TAKE 1 TABLET BY MOUTH DAILY Patient taking differently: Take 30 mg by mouth daily. 12/24/20  Yes Hilty, Nadean Corwin, MD  lisinopril (ZESTRIL) 5 MG tablet Take 5 mg by mouth daily. 12/04/20  Yes [provider]  nitroGLYCERIN (NITROSTAT) 0.4 MG SL tablet Place 1 tablet (0.4 mg total) under the tongue every 5 (five) minutes as needed for chest pain. 02/06/19  Yes Skeet Latch, MD  ondansetron (ZOFRAN) 4 MG tablet Take 4 mg by mouth 2 (two) times daily as needed for nausea/vomiting. 04/21/20  Yes [provider]  Oxycodone HCl 20 MG TABS Take 20 tablets by mouth 4 (four) times daily as needed for pain. LF on 05-31-21 # 120 05/31/21  Yes [provider]  pantoprazole (PROTONIX) 40 MG tablet Take 40 mg by mouth daily. 04/13/21  Yes [provider]  potassium chloride (MICRO-K) 10 MEQ CR capsule Take 20 mEq by mouth daily. 04/03/20  Yes [provider]  sertraline (ZOLOFT) 50 MG tablet Take 50 mg by mouth daily. 12/17/20  Yes [provider]  tiZANidine (ZANAFLEX) 4 MG tablet Take 4 mg by mouth at bedtime as needed for muscle spasms. 03/31/20  Yes [provider]  traZODone (DESYREL) 50 MG tablet Take 1 tablet (50 mg total) by mouth at bedtime. 12/15/16  Yes Burns, Arloa Koh, MD  Vitamin D, Ergocalciferol, (DRISDOL) 1.25 MG (50000 UT) CAPS capsule Take 50,000 Units by mouth every Monday. 08/15/19  Yes [provider]  Accu-Chek FastClix Lancets MISC 1 Device by Other route 2 (two) times daily.  03/20/19   [provider]  ACCU-CHEK GUIDE test strip 1 each by Other route 2 (two) times daily. 01/08/21   [provider]  furosemide (LASIX) 40 MG tablet  Take 1 tablet (40 mg total) by mouth daily. PLEASE RESUME ONLY FROM 10/21/2020. DOSE HAS BEEN CHANGED. Patient taking differently: Take 40 mg by mouth 3 (three) times daily. 10/21/20 10/21/21  Bonnielee Haff, MD  Insulin Pen Needle 31G X 5 MM MISC Use 1 needle daily to inject insulin as prescribed Patient taking differently: 1 each. Use 1 needle daily to  inject insulin as prescribed 06/18/17   Barton Dubois, MD    Allergies    Coconut flavor [flavoring agent], Coconut oil, Ibuprofen, Aleve [naproxen], Nsaids, and Vascepa [icosapent ethyl]  Review of Systems   Review of Systems  Respiratory:  Positive for shortness of breath.   Gastrointestinal:  Positive for blood in stool.  All other systems reviewed and are negative.  Physical Exam Updated Vital Signs BP 104/73   Pulse 75   Temp 98.3 F (36.8 C) (Oral)   Resp 20   SpO2 98%   Physical Exam Vitals and nursing note reviewed. Exam conducted with a chaperone present.  Constitutional:      Appearance: Normal appearance. He is obese. He is ill-appearing.  HENT:     Head: Normocephalic and atraumatic.     Right Ear: External ear normal.     Left Ear: External ear normal.     Nose: Nose normal.     Mouth/Throat:     Mouth: Mucous membranes are moist.     Pharynx: Oropharynx is clear.  Eyes:     Extraocular Movements: Extraocular movements intact.     Conjunctiva/sclera: Conjunctivae normal.     Pupils: Pupils are equal, round, and reactive to light.  Cardiovascular:     Rate and Rhythm: Normal rate and regular rhythm.     Pulses: Normal pulses.     Heart sounds: Normal heart sounds.  Pulmonary:     Breath sounds: Rhonchi present.  Abdominal:     General: Abdomen is flat. Bowel sounds are normal.     Palpations: Abdomen is soft.  Genitourinary:    Rectum: Guaiac result positive.     Comments: Gross blood on rectal exam Musculoskeletal:        General: Normal range of motion.     Cervical back: Normal range of motion and  neck supple.  Skin:    General: Skin is warm.     Capillary Refill: Capillary refill takes less than 2 seconds.  Neurological:     General: No focal deficit present.     Mental Status: He is alert.  Psychiatric:        Mood and Affect: Mood normal.        Behavior: Behavior normal.    ED Results / Procedures / Treatments   Labs (all labs ordered are listed, but only abnormal results are displayed) Labs Reviewed  COMPREHENSIVE METABOLIC PANEL - Abnormal; Notable for the following components:      Result Value   Potassium 3.4 (*)    Glucose, Bld 223 (*)    Calcium 8.8 (*)    Albumin 3.3 (*)    All other components within normal limits  CBC - Abnormal; Notable for the following components:   RBC 3.49 (*)    Hemoglobin 11.0 (*)    HCT 33.6 (*)    nRBC 0.4 (*)    All other components within normal limits  PROTIME-INR - Abnormal; Notable for the following components:   Prothrombin Time 18.5 (*)    INR 1.5 (*)    All other components within normal limits  POC OCCULT BLOOD, ED - Abnormal; Notable for the following components:   Fecal Occult Bld POSITIVE (*)    All other components within normal limits  TYPE AND SCREEN    EKG EKG Interpretation  Date/Time:  Wednesday June 30 2021 16:30:36 EDT Ventricular Rate:  75 PR Interval:  189 QRS Duration: 104 QT Interval:  397 QTC Calculation: 441 R Axis:  65 Text Interpretation: Sinus rhythm Atrial premature complexes Low voltage with right axis deviation Baseline wander in lead(s) V6 No significant change since last tracing Confirmed by Isla Pence [phone]) on 06/30/2021 4:50:13 PM  Radiology DG Chest Portable 1 View  Result Date: 06/30/2021 CLINICAL DATA:  Shortness of breath. EXAM: PORTABLE CHEST 1 VIEW COMPARISON:  Chest x-ray 06/26/2021.  CT chest 06/26/2021. FINDINGS: The heart is mildly enlarged. There is central pulmonary vascular congestion. There is no focal lung consolidation, pleural effusion or pneumothorax. No  acute fractures are seen. IMPRESSION: 1. Cardiomegaly with central pulmonary vascular congestion. Electronically Signed   By: Ronney Asters M.D.   On: 06/30/2021 16:56    Procedures Procedures   Medications Ordered in ED Medications  LORazepam (ATIVAN) tablet 0.5 mg (0.5 mg Oral Given 06/30/21 1646)  furosemide (LASIX) injection 40 mg (40 mg Intravenous Given 06/30/21 1647)    ED Course  I have reviewed the triage vital signs and the nursing notes.  Pertinent labs & imaging results that were available during my care of the patient were reviewed by me and considered in my medical decision making (see chart for details).    MDM Rules/Calculators/A&P                           Hgb has dropped from 12.3 yesterday to 11 today.  Pt has seen Dr. Michail Sermon [facility]) in the past.  Pt d/w Dr. Alessandra Bevels [facility] GI) who will see pt in consult tomorrow.  He recommends observation and holding Eliquis.  Pt given lasix for some chf.  He is feeling less sob after that.  He was d/w Dr. Avon Gully (triad) for admission.  CRITICAL CARE Performed by: Isla Pence   Total critical care time: 30 minutes  Critical care time was exclusive of separately billable procedures and treating other patients.  Critical care was necessary to treat or prevent imminent or life-threatening deterioration.  Critical care was time spent personally by me on the following activities: development of treatment plan with patient and/or surrogate as well as nursing, discussions with consultants, evaluation of patient's response to treatment, examination of patient, obtaining history from patient or surrogate, ordering and performing treatments and interventions, ordering and review of laboratory studies, ordering and review of radiographic studies, pulse oximetry and re-evaluation of patient's condition.    Final Clinical Impression(s) / ED Diagnoses Final diagnoses:  Rectal bleeding  On apixaban therapy  Anemia,  unspecified type  Acute on chronic congestive heart failure, unspecified heart failure type [facility])    Rx / DC Orders ED Discharge Orders     None        Isla Pence, MD 06/30/21 1729

## 2021-06-30 NOTE — ED Notes (Signed)
Unsuccessful IV attempt x2.  

## 2021-06-30 NOTE — ED Triage Notes (Signed)
Pt BIB EMS from home. Pt was diagnosed with a PE on Saturday and started on Eliquis. Pt reports diarrhea for the last few days and reports noticing some dark red blood in his stool today.  BP 100/60

## 2021-07-01 DIAGNOSIS — K922 Gastrointestinal hemorrhage, unspecified: Secondary | ICD-10-CM | POA: Diagnosis not present

## 2021-07-01 LAB — CBC
HCT: 34.3 % — ABNORMAL LOW (ref 39.0–52.0)
Hemoglobin: 11.3 g/dL — ABNORMAL LOW (ref 13.0–17.0)
MCH: 31.3 pg (ref 26.0–34.0)
MCHC: 32.9 g/dL (ref 30.0–36.0)
MCV: 95 fL (ref 80.0–100.0)
Platelets: 210 10*3/uL (ref 150–400)
RBC: 3.61 MIL/uL — ABNORMAL LOW (ref 4.22–5.81)
RDW: 14.5 % (ref 11.5–15.5)
WBC: 9.3 10*3/uL (ref 4.0–10.5)
nRBC: 0 % (ref 0.0–0.2)

## 2021-07-01 LAB — GLUCOSE, CAPILLARY
Glucose-Capillary: 219 mg/dL — ABNORMAL HIGH (ref 70–99)
Glucose-Capillary: 256 mg/dL — ABNORMAL HIGH (ref 70–99)
Glucose-Capillary: 191 mg/dL — ABNORMAL HIGH (ref 70–99)
Glucose-Capillary: 194 mg/dL — ABNORMAL HIGH (ref 70–99)

## 2021-07-01 LAB — BASIC METABOLIC PANEL
Anion gap: 8 (ref 5–15)
BUN: 14 mg/dL (ref 6–20)
CO2: 28 mmol/L (ref 22–32)
Calcium: 9 mg/dL (ref 8.9–10.3)
Chloride: 107 mmol/L (ref 98–111)
Creatinine, Ser: 0.94 mg/dL (ref 0.61–1.24)
GFR, Estimated: >60 mL/min (ref >60)
Glucose, Bld: 196 mg/dL — ABNORMAL HIGH (ref 70–99)
Potassium: 3.9 mmol/L (ref 3.5–5.1)
Sodium: 143 mmol/L (ref 135–145)

## 2021-07-01 MED ORDER — FUROSEMIDE 40 MG PO TABS
40.0000 mg | ORAL_TABLET | Freq: Once | ORAL | Status: AC
  Administered 2021-07-01: 40 mg via ORAL
  Filled 2021-07-01: qty 1

## 2021-07-01 MED ORDER — OXYCODONE HCL 5 MG PO TABS
20.0000 mg | ORAL_TABLET | Freq: Once | ORAL | Status: AC
  Administered 2021-07-01: 20 mg via ORAL
  Filled 2021-07-01: qty 4

## 2021-07-01 NOTE — Progress Notes (Signed)
RN messaged MD about morning meds. MD said to give pt;s meds with sips of water at this time. GI will have to change diet order when ready. RN gave morning meds

## 2021-07-01 NOTE — Plan of Care (Signed)
  Problem: Education: Goal: Knowledge of General Education information will improve Description Including pain rating scale, medication(s)/side effects and non-pharmacologic comfort measures Outcome: Progressing   

## 2021-07-01 NOTE — Consult Note (Signed)
Referring Provider: [facility] Primary Care Physician:  [name], FNP Primary Gastroenterologist: [facility] (Dr. Michail Sermon)  Reason for Consultation:  rectal bleeding  HPI: [name] is a 60 y.o. male type 2 diabetes mellitus, essential hypertension, OSA not on CPAP, chronic diastolic congestive heart failure and morbid obesity history of recurrent recent acute PE discharged 06/29/21 on eliquis after being evaluated for acute hypoxia and chest pain presents for rectal bleeding.  Patient states he started having dark red rectal bleeding yesterday. Reports a large amount of frank rectal bleeding. States he had generalized abdominal pain yesterday but not currently having pain (appears to have chronic diffuse abdominal pain per chart review). States he had some chest pain last night but none today. States he is always short of breath, however, no new or worsening shortness of breath from baseline. Denies fever, denies vomiting but does report some nausea yesterday.  Last dose of Eliquis yesterday 9/7.  Takes 81 mg ASA daily. No other NSAID use.  No known family history of colon cancer.  Last colonoscopy 12/2020: prep was fair. 6 mm sessile serrated adenoma at hepatic flexure, otherwise normal.  Past Medical History:  Diagnosis Date   Anemia    Arthritis    Back pain    CAD (coronary artery disease)    a. s/p DES to LAD in 05/2016   Cervical radiculopathy    Chronic diastolic CHF (congestive heart failure) (HCC)    Chronic pain    Depression    DVT (deep venous thrombosis) (HCC)    Hematemesis    Hepatic steatosis    Hyperlipidemia    Hypertension    IBS (irritable bowel syndrome)    Morbid obesity (HCC)    OSA (obstructive sleep apnea)    Pancreatitis    PE (pulmonary thromboembolism) ([place])    Peripheral neuropathy    PUD (peptic ulcer disease)    Renal disorder    Stroke [facility])    a. ?details unclear - not seen on imaging when he was admitted in 05/2017 for TIA symptoms which  were felt due to cervical radiculopathy.   Thoracic aortic ectasia (HCC)    a. 4.3cm ectatic ascending thoracic aorta by CT 06/2017.    Type 2 diabetes mellitus ([place])     Past Surgical History:  Procedure Laterality Date   CARDIAC CATHETERIZATION N/A 05/31/2016   Procedure: Left Heart Cath and Coronary Angiography;  Surgeon: Peter M Martinique, MD;  Location: [place] [facility];  Service: Cardiovascular;  Laterality: N/A;   CARDIAC CATHETERIZATION N/A 05/31/2016   Procedure: Intravascular Pressure Wire/FFR Study;  Surgeon: Peter M Martinique, MD;  Location: [place] [facility];  Service: Cardiovascular;  Laterality: N/A;   CARDIAC CATHETERIZATION N/A 05/31/2016   Procedure: Coronary Stent Intervention;  Surgeon: Peter M Martinique, MD;  Location: [place] [facility];  Service: Cardiovascular;  Laterality: N/A;   COLONOSCOPY WITH PROPOFOL N/A 04/29/2020   Procedure: COLONOSCOPY WITH PROPOFOL;  Surgeon: Wilford Corner, MD;  Location: [place];  Service: Endoscopy;  Laterality: N/A;   ESOPHAGOGASTRODUODENOSCOPY N/A 04/29/2020   Procedure: ESOPHAGOGASTRODUODENOSCOPY (EGD);  Surgeon: Wilford Corner, MD;  Location: [place];  Service: Endoscopy;  Laterality: N/A;   LEFT HEART CATH AND CORONARY ANGIOGRAPHY N/A 12/08/2017   Procedure: LEFT HEART CATH AND CORONARY ANGIOGRAPHY;  Surgeon: Leonie Man, MD;  Location: [place] [facility];  Service: Cardiovascular;  Laterality: N/A;   LEFT HEART CATHETERIZATION WITH CORONARY ANGIOGRAM N/A 02/03/2014   Procedure: LEFT HEART CATHETERIZATION WITH CORONARY  ANGIOGRAM;  Surgeon: Pixie Casino, MD;  Location: [facility] CATH LAB;  Service: Cardiovascular;  Laterality: N/A;   left leg stent      POLYPECTOMY  04/29/2020   Procedure: POLYPECTOMY;  Surgeon: Wilford Corner, MD;  Location: [facility] ENDOSCOPY;  Service: Endoscopy;;    Prior to Admission medications   Medication Sig Start Date End Date Taking? Authorizing Provider  albuterol (VENTOLIN HFA) 108 (90 Base) MCG/ACT  inhaler Inhale 2 puffs into the lungs every 6 (six) hours as needed for wheezing or shortness of breath. As Directed   Yes [provider]  apixaban (ELIQUIS) 5 MG TABS tablet Take 2 tablets ('10mg'$ ) twice daily for 7 days, then 1 tablet ('5mg'$ ) twice daily Patient taking differently: Take 5 mg by mouth 2 (two) times daily. 06/29/21  Yes [place] Donnamarie Poag, DO  aspirin 81 MG chewable tablet Chew 1 tablet (81 mg total) by mouth daily. 06/01/16  Yes Elwin Mocha, MD  atorvastatin (LIPITOR) 40 MG tablet Take 40 mg by mouth at bedtime.   Yes [provider]  carvedilol (COREG) 12.5 MG tablet Take 12.5 mg by mouth 2 (two) times daily. 10/22/20  Yes [provider]  famotidine (PEPCID) 20 MG tablet Take 20 mg by mouth daily. 03/05/20  Yes [provider]  fenofibrate (TRICOR) 145 MG tablet Take 1 tablet (145 mg total) by mouth daily. 06/25/21  Yes Cleaver, Jossie Ng, NP  furosemide (LASIX) 40 MG tablet Take 1 tablet (40 mg total) by mouth daily. PLEASE RESUME ONLY FROM 10/21/2020. DOSE HAS BEEN CHANGED. Patient taking differently: Take 40 mg by mouth 3 (three) times daily. 10/21/20 10/21/21 Yes Bonnielee Haff, MD  glipiZIDE (GLUCOTROL) 10 MG tablet Take 10 mg by mouth 2 (two) times daily. 05/30/17  Yes [provider]  HUMULIN R U-500 KWIKPEN 500 UNIT/ML kwikpen Inject 80 Units into the skin See admin instructions. Inject under the skin three times daily as directed or sliding scale: 06/04/20  Yes [provider]  ipratropium-albuterol (DUONEB) 0.5-2.5 (3) MG/3ML SOLN Take 3 mLs by nebulization 2 (two) times daily. 06/24/21  Yes [provider]  isosorbide mononitrate (IMDUR) 30 MG 24 hr tablet TAKE 1 TABLET BY MOUTH DAILY Patient taking differently: Take 30 mg by mouth daily. 12/24/20  Yes Hilty, Nadean Corwin, MD  lisinopril (ZESTRIL) 5 MG tablet Take 5 mg by mouth daily. 12/04/20  Yes [provider]  nitroGLYCERIN (NITROSTAT) 0.4 MG SL tablet Place 1  tablet (0.4 mg total) under the tongue every 5 (five) minutes as needed for chest pain. 02/06/19  Yes Skeet Latch, MD  ondansetron (ZOFRAN) 4 MG tablet Take 4 mg by mouth 2 (two) times daily as needed for nausea/vomiting. 04/21/20  Yes [provider]  Oxycodone HCl 20 MG TABS Take 20 tablets by mouth 4 (four) times daily as needed for pain. LF on 05-31-21 # 120 05/31/21  Yes [provider]  pantoprazole (PROTONIX) 40 MG tablet Take 40 mg by mouth daily. 04/13/21  Yes [provider]  potassium chloride (MICRO-K) 10 MEQ CR capsule Take 20 mEq by mouth daily. 04/03/20  Yes [provider]  sertraline (ZOLOFT) 50 MG tablet Take 50 mg by mouth daily. 12/17/20  Yes [provider]  tiZANidine (ZANAFLEX) 4 MG tablet Take 4 mg by mouth at bedtime as needed for muscle spasms. 03/31/20  Yes [provider]  traZODone (DESYREL) 50 MG tablet Take 1 tablet (50 mg total) by mouth at bedtime. 12/15/16  Yes Burns,  Arloa Koh, MD  Vitamin D, Ergocalciferol, (DRISDOL) 1.25 MG (50000 UT) CAPS capsule Take 50,000 Units by mouth every Monday. 08/15/19  Yes [provider]  Accu-Chek FastClix Lancets MISC 1 Device by Other route 2 (two) times daily.  03/20/19   [provider]  ACCU-CHEK GUIDE test strip 1 each by Other route 2 (two) times daily. 01/08/21   [provider]  Insulin Pen Needle 31G X 5 MM MISC Use 1 needle daily to inject insulin as prescribed Patient taking differently: 1 each. Use 1 needle daily to inject insulin as prescribed 06/18/17   Barton Dubois, MD    Scheduled Meds:  atorvastatin  40 mg Oral QHS   carvedilol  12.5 mg Oral BID   famotidine  20 mg Oral Daily   fenofibrate  160 mg Oral Daily   furosemide  40 mg Oral Daily   insulin aspart  0-15 Units Subcutaneous TID WC   insulin aspart  0-5 Units Subcutaneous QHS   isosorbide mononitrate  30 mg Oral Daily   lisinopril  5 mg Oral Daily   pantoprazole  40 mg Oral Daily    potassium chloride  10 mEq Oral Daily   sertraline  50 mg Oral Daily   traZODone  50 mg Oral QHS   Continuous Infusions:  sodium chloride 50 mL/hr at 07/01/21 0540   PRN Meds:.ondansetron, tiZANidine  Allergies as of 06/30/2021 - Review Complete 06/30/2021  Allergen Reaction Noted   Coconut flavor [flavoring agent] Hives 01/26/2017   Coconut oil Hives 03/09/2016   Ibuprofen Other (See Comments) 01/29/2014   Aleve [naproxen] Other (See Comments) 09/02/2017   Nsaids Other (See Comments) 12/27/2017   Vascepa [icosapent ethyl] Other (See Comments) 09/30/2019    Family History  Problem Relation Age of Onset   Cancer Father    Hypertension Mother    Diabetes Mother    Breast cancer Mother    Hypertension Brother    Diabetes Brother    Hypertension Sister    Diabetes Sister     Social History   Socioeconomic History   Marital status: Single    Spouse name: Not on file   Number of children: 3   Years of education: 12   Highest education level: Not on file  Occupational History   Occupation: Disabled  Tobacco Use   Smoking status: Never   Smokeless tobacco: Never  Vaping Use   Vaping Use: Never used  Substance and Sexual Activity   Alcohol use: No   Drug use: No   Sexual activity: Not on file  Other Topics Concern   Not on file  Social History Narrative   Independent and ambulatory with cane.   Lives at home alone.   Right-handed.   1-2 cups caffeine per day.   Social Determinants of Health   Financial Resource Strain: Not on file  Food Insecurity: Not on file  Transportation Needs: Not on file  Physical Activity: Not on file  Stress: Not on file  Social Connections: Not on file  Intimate Partner Violence: Not on file    Review of Systems: Review of Systems  Constitutional:  Negative for fever and weight loss.  HENT:  Negative for congestion and sore throat.   Eyes:  Negative for pain and redness.  Respiratory:  Positive for shortness of breath.  Negative for cough.   Cardiovascular:  Positive for chest pain. Negative for palpitations.  Gastrointestinal:  Positive for abdominal pain, blood in stool, diarrhea and nausea. Negative for  constipation, heartburn, melena and vomiting.  Genitourinary:  Negative for flank pain and hematuria.  Musculoskeletal:  Negative for joint pain and myalgias.  Skin:  Negative for itching and rash.  Neurological:  Positive for dizziness. Negative for loss of consciousness.  Psychiatric/Behavioral:  Negative for substance abuse. The patient is not nervous/anxious.     Physical Exam:Physical Exam Constitutional:      General: He is not in acute distress.    Appearance: He is obese. He is not ill-appearing.  HENT:     Head: Normocephalic and atraumatic.     Nose: Nose normal. No congestion.     Mouth/Throat:     Mouth: Mucous membranes are moist.     Pharynx: Oropharynx is clear.  Eyes:     Extraocular Movements: Extraocular movements intact.     Conjunctiva/sclera: Conjunctivae normal.  Cardiovascular:     Rate and Rhythm: Normal rate and regular rhythm.  Pulmonary:     Effort: Pulmonary effort is normal. No respiratory distress.  Abdominal:     General: Bowel sounds are normal. There is no distension.     Palpations: There is no mass.     Tenderness: There is no abdominal tenderness. There is no guarding or rebound.     Comments: Truncal obesity  Musculoskeletal:        General: No tenderness. Normal range of motion.     Cervical back: Normal range of motion and neck supple.  Skin:    General: Skin is warm and dry.     Coloration: Skin is not jaundiced.  Neurological:     General: No focal deficit present.     Mental Status: He is alert and oriented to person, place, and time.  Psychiatric:        Mood and Affect: Mood normal.        Behavior: Behavior normal.    Vital signs: Vitals:   07/01/21 0343 07/01/21 0755  BP: 134/72 (!) 164/77  Pulse: 69 75  Resp: 20   Temp: 98.1 F (36.7  C) 98.3 F (36.8 C)  SpO2: 96% 100%   Last BM Date: 06/30/21    GI:  Lab Results: Recent Labs    06/29/21 0107 06/30/21 1545 07/01/21 0511  WBC 11.0* 9.4 9.3  HGB 12.3* 11.0* 11.3*  HCT 37.3* 33.6* 34.3*  PLT 207 217 210   BMET Recent Labs    06/29/21 0107 06/30/21 1545 07/01/21 0511  NA 136 139 143  K 3.7 3.4* 3.9  CL 99 103 107  CO2 '30 29 28  '$ GLUCOSE 202* 223* 196*  BUN 5* 12 14  CREATININE 0.78 0.61 0.94  CALCIUM 8.4* 8.8* 9.0   LFT Recent Labs    06/30/21 1545  PROT 6.7  ALBUMIN 3.3*  AST 35  ALT 23  ALKPHOS 47  BILITOT 0.9   PT/INR Recent Labs    06/30/21 1545  LABPROT 18.5*  INR 1.5*     Studies/Results: DG Chest Portable 1 View  Result Date: 06/30/2021 CLINICAL DATA:  Shortness of breath. EXAM: PORTABLE CHEST 1 VIEW COMPARISON:  Chest x-ray 06/26/2021.  CT chest 06/26/2021. FINDINGS: The heart is mildly enlarged. There is central pulmonary vascular congestion. There is no focal lung consolidation, pleural effusion or pneumothorax. No acute fractures are seen. IMPRESSION: 1. Cardiomegaly with central pulmonary vascular congestion. Electronically Signed   By: Ronney Asters M.D.   On: 06/30/2021 16:56    Impression: Rectal bleeding, most consistent with diverticular bleeding. Has known diverticulosis (CT 04/2020) -  HGB 11.3 (decreased from baseline 14.3 2 weeks ago) - Normal renal function BUN 14, Cr 0.94   CHF: EF 60 to 65% 06/28/21  Reccurrent acute PE 06/26/21: on Eliquis (last dose 9/7)  DM type II   Plan:  Clear liquid diet  Observe patient clinical course, if destabilizing bleeding recommend CTA with IR consultation if positive.   Patient has outpatient colonoscopy scheduled 09/28/21 with Dr. Michail Sermon  Continue to monitor H&H with transfusion as needed to maintain HGB >7.  [facility] will follow     LOS: 1 day   Garnette Scheuermann  PA-C 07/01/2021, 8:36 AM  Contact #  [phone]

## 2021-07-01 NOTE — Progress Notes (Signed)
PROGRESS NOTE    [name]  [id number] DOB: 20-Jan-1961 DOA: 06/30/2021 PCP: [name], FNP   Brief Narrative:  [name] is a 60 year old male with past medical history significant for type 2 diabetes mellitus, essential hypertension, OSA not on CPAP, chronic diastolic congestive heart failure and morbid obesity history of recurrent recent acute PE discharged 06/29/21 on eliquis after being evaluated for acute hypoxia and chest pain. Unfortunately over the past 24h patient began having bright red stool and presented back to the hospital as directed at the time of discharge should he notice any abnormal bleeding. In ED: [facility] consulted - recommending cessation of anticoagulation and will re-evaluate in the next 24h.   Previous hospitalization 9/3 - 9/6 with progressive shortness of breath, respiratory distress and hypoxia with notable recurrent acute PE and notable recent covid viral infection now outside the window for precautions/treatment per CDC guidelines.   Assessment/Plan   Acute GI bleed in the setting of anticoagulation - GI consulted and continues to follow - appreciate insight and recs - Hgb stable - Clears for now - likely scope in the next 24-48h pending GI schedule   Acute hypoxic respiratory failure, POA Acute pulmonary embolism, recurrent - Stable from previous hospitalization - Holding anticoagulation per GI given active bleed - CT angiogram chest with pulmonary embolism distal right main pulmonary artery extending into the upper/middle/lower lobar branches with subtotally occlusion with RV strain  - History of previous PE/DVT 8 years ago and completed anticoagulation course without incident - Requiring 3 L per nasal cannula while ambulating since last admission - Recommend hypercoagulable work-up outpatient   COVID-19 viral infection - Patient reports recent diagnosis > 14 days ago - No longer requires PPE/quarantine per CDC guidelines    Essential hypertension - Continue home meds - carvedilol 12.5 mg p.o. twice daily, isosorbide mononitrate 30 mg p.o. daily, lisinopril 5 mg p.o. daily, furosemide 40 mg TID    Dyslipidemia - Continue atorvastatin 40 mg p.o. daily and fenofibrate 160 mg p.o. daily   Uncontrolled type 2 diabetes mellitus - Hemoglobin A1c 10.8 10/15/2020 - Continue sliding scale/diabetic diet   Chronic diastolic congestive heart failure Continue furosemide, carvedilol   OSA Noncompliance with CPAP outpatient.  Recommend repeat sleep study and counseling on need for CPAP.   Left lower extremity ulceration malleolus, POA Evaluated by wound care. Cleanse with soap and water, Eucerin cream covered with antimicrobial nonadherent Xeroform gauze with ABD pad and secured with Kerlix roll per last hospitalization   GERD: Continue PPI   Depression: Sertraline 50 mg p.o. daily   Morbid obesity Body mass index is 49.35 kg/m.  Discussed with patient needs for aggressive lifestyle changes/weight loss as this complicates all facets of care.  Outpatient follow-up with PCP.  May benefit from bariatric evaluation outpatient.   DVT prophylaxis: None, actively bleeding as above Code Status: Full Family Communication: None present Status is: Inpatient   Dispo: The patient is from: Home              Anticipated d/c is to: Home              Anticipated d/c date is: 48 to 72 hours pending clinical course              Patient currently not medically stable for discharge given ongoing active GI bleed in the setting of anticoagulation   Consultants:  GI, [facility]   Procedures:  None planned, defer to GI as above  Subjective:  No acute issues or events overnight, patient's main complaint is his n.p.o. status while being evaluated by GI.  We discussed that he is n.p.o. in case he needs to have procedure later today.  Otherwise he declines any abdominal pain nausea vomiting diarrhea constipation headache fevers or chills.   Bowel movements are now more maroon/dark red compared to previous bright red blood per rectum at intake yesterday.  Objective: Vitals:   06/30/21 2040 07/01/21 0024 07/01/21 0343 07/01/21 0755  BP:  117/61 134/72 (!) 164/77  Pulse:  75 69 75  Resp:  20 20   Temp:  98.4 F (36.9 C) 98.1 F (36.7 C) 98.3 F (36.8 C)  TempSrc:  Oral Oral Oral  SpO2:  98% 96% 100%  Weight: (!) 160 kg     Height: '5\' 10"'$  (1.778 m)       Intake/Output Summary (Last 24 hours) at 07/01/2021 0826 Last data filed at 07/01/2021 0200 Gross per 24 hour  Intake 233.52 ml  Output --  Net 233.52 ml   Filed Weights   06/30/21 2040  Weight: (!) 160 kg    Examination:  General exam: Appears calm and comfortable  Respiratory system: Clear to auscultation. Respiratory effort normal. Cardiovascular system: S1 & S2 heard, RRR. No JVD, murmurs, rubs, gallops or clicks. No pedal edema. Gastrointestinal system: Abdomen is nondistended, soft and nontender. No organomegaly or masses felt. Normal bowel sounds heard. Central nervous system: Alert and oriented. No focal neurological deficits. Extremities: Symmetric 5 x 5 power. Skin: No rashes, lesions or left lower extremity malleoli are ulceration Psychiatry: Judgement and insight appear normal. Mood & affect appropriate.     Data Reviewed: I have personally reviewed following labs and imaging studies  CBC: Recent Labs  Lab 06/27/21 0142 06/28/21 0045 06/29/21 0107 06/30/21 1545 07/01/21 0511  WBC 9.4 10.1 11.0* 9.4 9.3  HGB 12.2* 12.1* 12.3* 11.0* 11.3*  HCT 37.0* 36.4* 37.3* 33.6* 34.3*  MCV 94.4 94.3 95.4 96.3 95.0  PLT 186 187 207 217 [id number]   Basic Metabolic Panel: Recent Labs  Lab 06/27/21 0142 06/28/21 0045 06/29/21 0107 06/30/21 1545 07/01/21 0511  NA 137 136 136 139 143  K 3.4* 3.5 3.7 3.4* 3.9  CL 100 101 99 103 107  CO2 '24 24 30 29 28  '$ GLUCOSE 232* 247* 202* 223* 196*  BUN 11 8 5* 12 14  CREATININE 0.78 0.76 0.78 0.61 0.94  CALCIUM  8.8* 8.6* 8.4* 8.8* 9.0  MG  --  1.8  --   --   --    GFR: Estimated Creatinine Clearance: 132.2 mL/min (by C-G formula based on SCr of 0.94 mg/dL). Liver Function Tests: Recent Labs  Lab 06/27/21 0142 06/28/21 0045 06/29/21 0107 06/30/21 1545  AST 37 28 23 35  ALT '30 28 26 23  '$ ALKPHOS 45 45 48 47  BILITOT 0.9 0.9 0.7 0.9  PROT 6.5 6.6 6.7 6.7  ALBUMIN 3.2* 3.3* 3.3* 3.3*   No results for input(s): LIPASE, AMYLASE in the last 168 hours. No results for input(s): AMMONIA in the last 168 hours. Coagulation Profile: Recent Labs  Lab 06/26/21 2001 06/30/21 1545  INR 1.2 1.5*   Cardiac Enzymes: No results for input(s): CKTOTAL, CKMB, CKMBINDEX, TROPONINI in the last 168 hours. BNP (last 3 results) No results for input(s): PROBNP in the last 8760 hours. HbA1C: No results for input(s): HGBA1C in the last 72 hours. CBG: Recent Labs  Lab 06/29/21 0558 06/29/21 1053 06/29/21 1247 06/30/21 2138 07/01/21 0751  GLUCAP 206* 225* 214* 154* 219*   Lipid Profile: No results for input(s): CHOL, HDL, LDLCALC, TRIG, CHOLHDL, LDLDIRECT in the last 72 hours. Thyroid Function Tests: No results for input(s): TSH, T4TOTAL, FREET4, T3FREE, THYROIDAB in the last 72 hours. Anemia Panel: No results for input(s): VITAMINB12, FOLATE, FERRITIN, TIBC, IRON, RETICCTPCT in the last 72 hours. Sepsis Labs: Recent Labs  Lab 06/26/21 1943  PROCALCITON <0.10    No results found for this or any previous visit (from the past 240 hour(s)).       Radiology Studies: DG Chest Portable 1 View  Result Date: 06/30/2021 CLINICAL DATA:  Shortness of breath. EXAM: PORTABLE CHEST 1 VIEW COMPARISON:  Chest x-ray 06/26/2021.  CT chest 06/26/2021. FINDINGS: The heart is mildly enlarged. There is central pulmonary vascular congestion. There is no focal lung consolidation, pleural effusion or pneumothorax. No acute fractures are seen. IMPRESSION: 1. Cardiomegaly with central pulmonary vascular congestion.  Electronically Signed   By: Ronney Asters M.D.   On: 06/30/2021 16:56        Scheduled Meds:  atorvastatin  40 mg Oral QHS   carvedilol  12.5 mg Oral BID   famotidine  20 mg Oral Daily   fenofibrate  160 mg Oral Daily   furosemide  40 mg Oral Daily   insulin aspart  0-15 Units Subcutaneous TID WC   insulin aspart  0-5 Units Subcutaneous QHS   isosorbide mononitrate  30 mg Oral Daily   lisinopril  5 mg Oral Daily   pantoprazole  40 mg Oral Daily   potassium chloride  10 mEq Oral Daily   sertraline  50 mg Oral Daily   traZODone  50 mg Oral QHS   Continuous Infusions:  sodium chloride 50 mL/hr at 07/01/21 0540     LOS: 1 day   Time spent: 35 min  Little Ishikawa, DO [facility]  If 7PM-7AM, please contact night-coverage [email or web address]  07/01/2021, 8:26 AM

## 2021-07-02 DIAGNOSIS — K922 Gastrointestinal hemorrhage, unspecified: Secondary | ICD-10-CM | POA: Diagnosis not present

## 2021-07-02 LAB — CBC
HCT: 34.6 % — ABNORMAL LOW (ref 39.0–52.0)
Hemoglobin: 11.4 g/dL — ABNORMAL LOW (ref 13.0–17.0)
MCH: 31.8 pg (ref 26.0–34.0)
MCHC: 32.9 g/dL (ref 30.0–36.0)
MCV: 96.4 fL (ref 80.0–100.0)
Platelets: 216 10*3/uL (ref 150–400)
RBC: 3.59 MIL/uL — ABNORMAL LOW (ref 4.22–5.81)
RDW: 14.5 % (ref 11.5–15.5)
WBC: 9 10*3/uL (ref 4.0–10.5)
nRBC: 0 % (ref 0.0–0.2)

## 2021-07-02 LAB — BASIC METABOLIC PANEL
Anion gap: 13 (ref 5–15)
BUN: 9 mg/dL (ref 6–20)
CO2: 26 mmol/L (ref 22–32)
Calcium: 8.7 mg/dL — ABNORMAL LOW (ref 8.9–10.3)
Chloride: 99 mmol/L (ref 98–111)
Creatinine, Ser: 0.74 mg/dL (ref 0.61–1.24)
GFR, Estimated: >60 mL/min (ref >60)
Glucose, Bld: 221 mg/dL — ABNORMAL HIGH (ref 70–99)
Potassium: 3.2 mmol/L — ABNORMAL LOW (ref 3.5–5.1)
Sodium: 138 mmol/L (ref 135–145)

## 2021-07-02 LAB — GLUCOSE, CAPILLARY
Glucose-Capillary: 204 mg/dL — ABNORMAL HIGH (ref 70–99)
Glucose-Capillary: 264 mg/dL — ABNORMAL HIGH (ref 70–99)
Glucose-Capillary: 282 mg/dL — ABNORMAL HIGH (ref 70–99)
Glucose-Capillary: 232 mg/dL — ABNORMAL HIGH (ref 70–99)

## 2021-07-02 MED ORDER — OXYCODONE HCL 20 MG PO TABS: 20.0000 mg | ORAL_TABLET | Freq: Four times a day (QID) | ORAL | 0 refills | Status: AC | PRN

## 2021-07-02 MED ORDER — APIXABAN 5 MG PO TABS: ORAL_TABLET | ORAL | 0 refills | Status: DC

## 2021-07-02 NOTE — Discharge Summary (Signed)
Physician Discharge Summary  [name] [id number] DOB: [birth year] DOA: 06/30/2021  PCP: [name], FNP  Admit date: 06/30/2021 Discharge date: 07/02/2021  Admitted From: Home Disposition: Home  Recommendations for Outpatient Follow-up:  Follow up with PCP in 1-2 weeks Please obtain BMP/CBC in one week Please follow up with GI as scheduled  Discharge Condition: Stable CODE STATUS: Full Diet recommendation: Low-salt low-fat diabetic diet  Brief/Interim Summary: [name] is a 60 year old male with past medical history significant for type 2 diabetes mellitus, essential hypertension, OSA not on CPAP, chronic diastolic congestive heart failure and morbid obesity history of recurrent recent acute PE discharged 06/29/21 on eliquis after being evaluated for acute hypoxia and chest pain. Unfortunately over the past 24h patient began having bright red stool and presented back to the hospital as directed at the time of discharge should he notice any abnormal bleeding. In ED: [facility] consulted - recommending cessation of anticoagulation and will re-evaluate in the next 24h.   Previous hospitalization 9/3 - 9/6 with progressive shortness of breath, respiratory distress and hypoxia with notable recurrent acute PE and notable recent covid viral infection now outside the window for precautions/treatment per CDC guidelines.  Patient mated with acute GI bleed in the setting of newly prescribed anticoagulant in the setting of recent PE diagnosis.  Continue to hold anticoagulation through 07/04/2021 per recommendations by GI and then resume.  If worsening bleeding at that time or symptoms of fatigue weakness or lightheadedness please return back to the hospital or seek medical attention with PCP.  Follow-up with PCP next week for repeat hemoglobin to ensure stable labs.   Assessment/Plan   Acute GI bleed in the setting of anticoagulation - GI consulted and continues to follow - appreciate  insight and recs - Hgb stable -Advance diet as tolerated, no indication for scope at this time given bleeding has resolved with cessation of anticoagulation   Acute hypoxic respiratory failure, POA Acute pulmonary embolism, recurrent - Stable from previous hospitalization -Continue to hold anticoagulation through 07/04/2021, resume thereafter   COVID-19 viral infection - Patient reports recent diagnosis > 14 days ago - No longer requires PPE/quarantine per CDC guidelines   Essential hypertension - Continue home meds - carvedilol 12.5 mg p.o. twice daily, isosorbide mononitrate 30 mg p.o. daily, lisinopril 5 mg p.o. daily, furosemide 40 mg TID    Dyslipidemia - Continue atorvastatin 40 mg p.o. daily and fenofibrate 160 mg p.o. daily   Uncontrolled type 2 diabetes mellitus - Hemoglobin A1c 10.8 10/15/2020 - Continue sliding scale/diabetic diet   Chronic diastolic congestive heart failure Continue furosemide, carvedilol   OSA Noncompliance with CPAP outpatient.  Recommend repeat sleep study and counseling on need for CPAP.   Left lower extremity ulceration malleolus, POA Evaluated by wound care. Cleanse with soap and water, Eucerin cream covered with antimicrobial nonadherent Xeroform gauze with ABD pad and secured with Kerlix roll per last hospitalization   GERD: Continue PPI   Depression: Sertraline 50 mg p.o. daily   Morbid obesity Body mass index is 49.35 kg/m.  Discussed with patient needs for aggressive lifestyle changes/weight loss as this complicates all facets of care.  Outpatient follow-up with PCP.  May benefit from bariatric evaluation outpatient.  Discharge Diagnoses:  Active Problems:   Acute GI bleeding    Discharge Instructions  Discharge Instructions     Call MD for:  extreme fatigue   Complete by: As directed    Call MD for:  persistant dizziness or light-headedness  Complete by: As directed    Diet - low sodium heart healthy   Complete by: As  directed    Increase activity slowly   Complete by: As directed    No wound care   Complete by: As directed       Allergies as of 07/02/2021       Reactions   Coconut Flavor [flavoring Agent] Hives   Coconut Oil Hives   Ibuprofen Other (See Comments)   Made gastric ulcers worse   Aleve [naproxen] Other (See Comments)   DUE TO KIDNEYS   Nsaids Other (See Comments)   Stomach ulcers   Vascepa [icosapent Ethyl] Other (See Comments)   headaches, chest pain, similar to sx of a stroke, hypotension         Medication List     STOP taking these medications    aspirin 81 MG chewable tablet       TAKE these medications    Accu-Chek FastClix Lancets Misc 1 Device by Other route 2 (two) times daily.   Accu-Chek Guide test strip Generic drug: glucose blood 1 each by Other route 2 (two) times daily.   albuterol 108 (90 Base) MCG/ACT inhaler Commonly known as: VENTOLIN HFA Inhale 2 puffs into the lungs every 6 (six) hours as needed for wheezing or shortness of breath. As Directed   apixaban 5 MG Tabs tablet Commonly known as: Eliquis Take 2 tablets ('10mg'$ ) twice daily for 7 days, then 1 tablet ('5mg'$ ) twice daily Start taking on: July 04, 2021 What changed: These instructions start on July 04, 2021. If you are unsure what to do until then, ask your doctor or other care provider.   atorvastatin 40 MG tablet Commonly known as: LIPITOR Take 40 mg by mouth at bedtime.   carvedilol 12.5 MG tablet Commonly known as: COREG Take 12.5 mg by mouth 2 (two) times daily.   famotidine 20 MG tablet Commonly known as: PEPCID Take 20 mg by mouth daily.   fenofibrate 145 MG tablet Commonly known as: TRICOR Take 1 tablet (145 mg total) by mouth daily.   furosemide 40 MG tablet Commonly known as: Lasix Take 1 tablet (40 mg total) by mouth daily. PLEASE RESUME ONLY FROM 10/21/2020. DOSE HAS BEEN CHANGED. What changed:  when to take this additional instructions    glipiZIDE 10 MG tablet Commonly known as: GLUCOTROL Take 10 mg by mouth 2 (two) times daily.   HumuLIN R U-500 KwikPen 500 UNIT/ML KwikPen Generic drug: insulin regular human CONCENTRATED Inject 80 Units into the skin See admin instructions. Inject under the skin three times daily as directed or sliding scale:   Insulin Pen Needle 31G X 5 MM Misc Use 1 needle daily to inject insulin as prescribed What changed: how much to take   ipratropium-albuterol 0.5-2.5 (3) MG/3ML Soln Commonly known as: DUONEB Take 3 mLs by nebulization 2 (two) times daily.   isosorbide mononitrate 30 MG 24 hr tablet Commonly known as: IMDUR TAKE 1 TABLET BY MOUTH DAILY   lisinopril 5 MG tablet Commonly known as: ZESTRIL Take 5 mg by mouth daily.   nitroGLYCERIN 0.4 MG SL tablet Commonly known as: NITROSTAT Place 1 tablet (0.4 mg total) under the tongue every 5 (five) minutes as needed for chest pain.   ondansetron 4 MG tablet Commonly known as: ZOFRAN Take 4 mg by mouth 2 (two) times daily as needed for nausea/vomiting.   Oxycodone HCl 20 MG Tabs Take 1 tablet (20 mg total) by mouth 4 (four)  times daily as needed for up to 3 days. LF on 05-31-21 # 120 What changed:  how much to take reasons to take this   pantoprazole 40 MG tablet Commonly known as: PROTONIX Take 40 mg by mouth daily.   potassium chloride 10 MEQ CR capsule Commonly known as: MICRO-K Take 20 mEq by mouth daily.   sertraline 50 MG tablet Commonly known as: ZOLOFT Take 50 mg by mouth daily.   tiZANidine 4 MG tablet Commonly known as: ZANAFLEX Take 4 mg by mouth at bedtime as needed for muscle spasms.   traZODone 50 MG tablet Commonly known as: DESYREL Take 1 tablet (50 mg total) by mouth at bedtime.   Vitamin D (Ergocalciferol) 1.25 MG (50000 UNIT) Caps capsule Commonly known as: DRISDOL Take 50,000 Units by mouth every Monday.        Follow-up Information     [name], FNP Follow up.   Specialty:  Family Medicine Contact information: [place] [address] [phone]         Wilford Corner, MD Follow up in 2 month(s).   Specialty: Gastroenterology Why: History of PE, recent GI bleed.  Scheduled for colonoscopy in December 2022 Contact information: [address] [place] [address] [phone]                Allergies  Allergen Reactions   Coconut Flavor [Flavoring Agent] Hives   Coconut Oil Hives   Ibuprofen Other (See Comments)    Made gastric ulcers worse   Aleve [Naproxen] Other (See Comments)    DUE TO KIDNEYS   Nsaids Other (See Comments)    Stomach ulcers    Vascepa [Icosapent Ethyl] Other (See Comments)    headaches, chest pain, similar to sx of a stroke, hypotension     Consultations: [facility]  Procedures/Studies: DG Chest 2 View  Result Date: 06/26/2021 CLINICAL DATA:  Chest pain and shortness of breath EXAM: CHEST - 2 VIEW COMPARISON:  06/15/2021 FINDINGS: Stable cardiomegaly with vascular congestion. Minor basilar atelectasis. Negative for pneumonia, CHF, effusion or pneumothorax. Trachea midline. Degenerative changes of the spine. Lateral view is limited with motion artifact. No severe compression fracture. IMPRESSION: Stable chest exam. No interval change or acute process by plain radiography. Electronically Signed   By: Jerilynn Mages.  Shick M.D.   On: 06/26/2021 12:19   DG Chest 2 View  Result Date: 06/15/2021 CLINICAL DATA:  Shortness of breath, cough, fever EXAM: CHEST - 2 VIEW COMPARISON:  Chest radiograph 06/05/2021 FINDINGS: The heart is enlarged, unchanged. The mediastinal contours are stable. There is vascular congestion without frank interstitial edema. There is no focal consolidation. There is no pleural effusion. There is no pneumothorax. There is no acute osseous abnormality. IMPRESSION: 1. Cardiomegaly with vascular congestion but no overt pulmonary edema, overall unchanged. 2. No new focal airspace  disease. Electronically Signed   By: Valetta Mole M.D.   On: 06/15/2021 11:48   DG Chest 2 View  Result Date: 06/05/2021 CLINICAL DATA:  Chest pain, shortness of breath EXAM: CHEST - 2 VIEW COMPARISON:  06/04/2021 FINDINGS: Heart size is upper limits of normal. Mild pulmonary vascular congestion. No airspace consolidation or overt pulmonary edema. No pleural effusion or pneumothorax. Degenerative changes within the thoracic spine. IMPRESSION: Mild pulmonary vascular congestion without overt pulmonary edema. Electronically Signed   By: Davina Poke D.O.   On: 06/05/2021 12:48   CT Angio Chest PE W and/or Wo Contrast  Result Date: 06/26/2021  CLINICAL DATA:  Shortness of breath. High probability for pulmonary embolus. EXAM: CT ANGIOGRAPHY CHEST WITH CONTRAST TECHNIQUE: Multidetector CT imaging of the chest was performed using the standard protocol during bolus administration of intravenous contrast. Multiplanar CT image reconstructions and MIPs were obtained to evaluate the vascular anatomy. CONTRAST:  32m OMNIPAQUE IOHEXOL 350 MG/ML SOLN COMPARISON:  Radiograph earlier today.  Chest CTA 07/31/2019 FINDINGS: Cardiovascular: Positive for pulmonary embolus with filling defects involving the distal right main pulmonary artery extending into the upper, middle, and lower lobar branches. Thrombus is subtotally occlusive. Segmental filling defects in the left lower lobe pulmonary artery, and subsegmental left upper lobe pulmonary arterial filling defects. There is dilatation of the main pulmonary artery at 4.8 cm, chronic. Elevated RV to LV ratio of 1.08. Heart size upper normal. There is no pericardial effusion. Coronary artery calcification or stent. No aortic dissection. Mediastinum/Nodes: No enlarged mediastinal or hilar lymph nodes. Patulous esophagus without wall thickening. No thyroid nodule. Lungs/Pleura: Patchy ground-glass opacity in the right upper lobe, slightly perifissural but not peripheral or  wedge-shaped. Minor dependent atelectasis in the lower lobes. No pleural fluid. No pulmonary mass. Upper Abdomen: Enlarged liver with steatosis. No acute upper abdominal findings. Musculoskeletal: Diffuse thoracic spondylosis with endplate spurring and degenerative disc disease. No acute osseous abnormalities are seen. Review of the MIP images confirms the above findings. IMPRESSION: 1. Positive for pulmonary embolus with moderate clot burden and evidence of right heart strain, RV to LV ratio of 1.08. 2. Patchy ground-glass opacity in the right upper lobe, slightly perifissural but not peripheral or wedge-shaped. Favor pneumonia over pulmonary infarct. 3. Chronic dilatation of the main pulmonary artery consistent with pulmonary arterial hypertension. 4. Coronary artery calcification or stent. 5. Incidental hepatomegaly and steatosis in the upper abdomen. Critical Value/emergent results were called by telephone at the time of interpretation on 06/26/2021 at 6:58 pm [facility] verbally acknowledged these results. Electronically Signed   By: MKeith RakeM.D.   On: 06/26/2021 18:58   DG Chest Portable 1 View  Result Date: 06/30/2021 CLINICAL DATA:  Shortness of breath. EXAM: PORTABLE CHEST 1 VIEW COMPARISON:  Chest x-ray 06/26/2021.  CT chest 06/26/2021. FINDINGS: The heart is mildly enlarged. There is central pulmonary vascular congestion. There is no focal lung consolidation, pleural effusion or pneumothorax. No acute fractures are seen. IMPRESSION: 1. Cardiomegaly with central pulmonary vascular congestion. Electronically Signed   By: ARonney AstersM.D.   On: 06/30/2021 16:56   VAS [place]OWER EXTREMITY VENOUS (DVT)  Result Date: 06/28/2021  Lower Venous DVT Study Patient Name:  JNEKO KOMISAR Date of Exam:   06/28/2021 Medical Rec #: [id number]        Accession #:    [id number] of Birth: 123-Mar-1962       Patient Gender: M Patient Age:   89years [facility] Procedure:      VAS [place]OWER EXTREMITY VENOUS (DVT) Referring Phys: JJennette Kettle--------------------------------------------------------------------------------  Indications: Pulmonary embolism.  Risk Factors: COVID 19 positive. Limitations: Body habitus, poor ultrasound/tissue interface and calf skin induration. Comparison Study: No prior studies. Performing Technologist: GOliver HumRVT  Examination Guidelines: A complete evaluation includes B-mode imaging, spectral Doppler, color Doppler, and power Doppler as needed of all accessible portions of each vessel. Bilateral testing is considered an integral part of a complete examination. Limited examinations for reoccurring indications may be performed as noted. The reflux portion of the exam is  performed with the patient in reverse Trendelenburg.  +---------+---------------+---------+-----------+----------+-------------------+ RIGHT    CompressibilityPhasicitySpontaneityPropertiesThrombus Aging      +---------+---------------+---------+-----------+----------+-------------------+ CFV      Full           Yes      Yes                                      +---------+---------------+---------+-----------+----------+-------------------+ SFJ      Full                                                             +---------+---------------+---------+-----------+----------+-------------------+ FV Prox  Full                                                             +---------+---------------+---------+-----------+----------+-------------------+ FV Mid   Full                                                             +---------+---------------+---------+-----------+----------+-------------------+ FV DistalFull                                                             +---------+---------------+---------+-----------+----------+-------------------+ PFV      Full                                                              +---------+---------------+---------+-----------+----------+-------------------+ POP      Full           Yes      Yes                                      +---------+---------------+---------+-----------+----------+-------------------+ PTV                                                   Not well visualized +---------+---------------+---------+-----------+----------+-------------------+ PERO                                                  Not well visualized +---------+---------------+---------+-----------+----------+-------------------+   +---------+---------------+---------+-----------+----------+-------------------+ LEFT     CompressibilityPhasicitySpontaneityPropertiesThrombus Aging      +---------+---------------+---------+-----------+----------+-------------------+  CFV      Full           Yes      Yes                                      +---------+---------------+---------+-----------+----------+-------------------+ SFJ      Full                                                             +---------+---------------+---------+-----------+----------+-------------------+ FV Prox  Full                                                             +---------+---------------+---------+-----------+----------+-------------------+ FV Mid   Full                                                             +---------+---------------+---------+-----------+----------+-------------------+ FV DistalFull                                                             +---------+---------------+---------+-----------+----------+-------------------+ PFV      Full                                                             +---------+---------------+---------+-----------+----------+-------------------+ POP      Full           Yes      Yes                                      +---------+---------------+---------+-----------+----------+-------------------+  PTV                                                   Not well visualized +---------+---------------+---------+-----------+----------+-------------------+ PERO                                                  Not well visualized +---------+---------------+---------+-----------+----------+-------------------+     Summary: RIGHT: - There is no evidence of deep vein thrombosis in the lower extremity. However, portions of this examination were limited- see technologist comments above.  - No cystic structure found  in the popliteal fossa.  LEFT: - There is no evidence of deep vein thrombosis in the lower extremity. However, portions of this examination were limited- see technologist comments above.  - No cystic structure found in the popliteal fossa.  *See table(s) above for measurements and observations. Electronically signed by Deitra Mayo MD on 06/28/2021 at 3:16:58 PM.    Final    ECHOCARDIOGRAM LIMITED  Result Date: 06/28/2021    ECHOCARDIOGRAM LIMITED REPORT   Patient Name:   EMILIO MITCHELLE Date of Exam: 06/28/2021 Medical Rec #:  [id number]        Height:       70.0 in Accession #:    [id number]       Weight:       343.9 lb Date of Birth:  [birth year]       BSA:          2.628 m Patient Age:    30 years         BP:           133/87 mmHg Patient Gender: M                HR:           92 bpm. Exam Location:  Inpatient Procedure: Limited Echo, Cardiac Doppler, Color Doppler and Intracardiac            Opacification Agent Indications:    Pulmonary embolus  History:        Patient has prior history of Echocardiogram examinations. CHF,                 CAD; Risk Factors:Diabetes, Dyslipidemia and Hypertension. H/O                 stroke. COVID-19.  Sonographer:    Clayton Lefort RDCS (AE) Referring Phys: [phone] JARED M GARDNER  Sonographer Comments: Technically difficult study due to poor echo windows, suboptimal apical window and patient is morbidly obese. Image acquisition challenging due to patient body  habitus. COVID-19. IMPRESSIONS  1. Left ventricular ejection fraction, by estimation, is 60 to 65%. The left ventricle has normal function. The left ventricle has no regional wall motion abnormalities. There is moderate left ventricular hypertrophy.  2. Right ventricular systolic function is normal. The right ventricular size is normal.  3. The mitral valve is normal in structure. No evidence of mitral valve regurgitation. No evidence of mitral stenosis.  4. The aortic valve is normal in structure. Aortic valve regurgitation is not visualized. No aortic stenosis is present.  5. The inferior vena cava is dilated in size with >50% respiratory variability, suggesting right atrial pressure of 8 mmHg. FINDINGS  Left Ventricle: Left ventricular ejection fraction, by estimation, is 60 to 65%. The left ventricle has normal function. The left ventricle has no regional wall motion abnormalities. Definity contrast agent was given IV to delineate the left ventricular  endocardial borders. The left ventricular internal cavity size was normal in size. There is moderate left ventricular hypertrophy. Right Ventricle: The right ventricular size is normal. No increase in right ventricular wall thickness. Right ventricular systolic function is normal. Left Atrium: Left atrial size was normal in size. Right Atrium: Right atrial size was normal in size. Pericardium: There is no evidence of pericardial effusion. Mitral Valve: The mitral valve is normal in structure. No evidence of mitral valve stenosis. Tricuspid Valve: The tricuspid valve is normal in structure. Tricuspid valve regurgitation is not demonstrated. No evidence of tricuspid stenosis. Aortic  Valve: The aortic valve is normal in structure. Aortic valve regurgitation is not visualized. No aortic stenosis is present. Pulmonic Valve: The pulmonic valve was normal in structure. Pulmonic valve regurgitation is not visualized. No evidence of pulmonic stenosis. Aorta: The aortic  root is normal in size and structure. Venous: The inferior vena cava is dilated in size with greater than 50% respiratory variability, suggesting right atrial pressure of 8 mmHg. IAS/Shunts: No atrial level shunt detected by color flow Doppler. LEFT VENTRICLE PLAX 2D LVIDd:         4.10 cm  Diastology LVIDs:         3.00 cm  LV e' medial:    8.05 cm/s LV PW:         1.60 cm  LV E/e' medial:  8.1 LV IVS:        1.60 cm  LV e' lateral:   8.70 cm/s LVOT diam:     2.10 cm  LV E/e' lateral: 7.5 LVOT Area:     3.46 cm  IVC IVC diam: 2.90 cm LEFT ATRIUM         Index LA diam:    2.30 cm 0.88 cm/m   AORTA Ao Root diam: 3.60 cm Ao Asc diam:  3.70 cm MITRAL VALVE MV Area (PHT): 3.99 cm    SHUNTS MV Decel Time: 190 msec    Systemic Diam: 2.10 cm MV E velocity: 65.40 cm/s MV A velocity: 65.40 cm/s MV E/A ratio:  1.00 Jenkins Rouge MD Electronically signed by Jenkins Rouge MD Signature Date/Time: 06/28/2021/12:52:55 PM    Final      Subjective: No acute issues or events overnight   Discharge Exam: Vitals:   07/02/21 1004 07/02/21 1229  BP: (!) 134/55 97/65  Pulse: 68 71  Resp:    Temp:    SpO2:  95%   Vitals:   07/01/21 2015 07/02/21 0540 07/02/21 1004 07/02/21 1229  BP: (!) 153/70 (!) 154/91 (!) 134/55 97/65  Pulse: 66 64 68 71  Resp: 20 20    Temp: 98.1 F (36.7 C) 97.8 F (36.6 C)    TempSrc: Oral Oral    SpO2: 100% 97%  95%  Weight:      Height:        General: Pt is alert, awake, not in acute distress Cardiovascular: RRR, S1/S2 +, no rubs, no gallops Respiratory: CTA bilaterally, no wheezing, no rhonchi Abdominal: Soft, NT, ND, bowel sounds + Extremities: no edema, no cyanosis    The results of significant diagnostics from this hospitalization (including imaging, microbiology, ancillary and laboratory) are listed below for reference.     Microbiology: No results found for this or any previous visit (from the past 240 hour(s)).   Labs: BNP (last 3 results) Recent Labs     06/05/21 1142 06/15/21 1102 06/26/21 1140  BNP 31.9 13.3 [id number]   Basic Metabolic Panel: Recent Labs  Lab 06/28/21 0045 06/29/21 0107 06/30/21 1545 07/01/21 0511 07/02/21 0500  NA 136 136 139 143 138  K 3.5 3.7 3.4* 3.9 3.2*  CL 101 99 103 107 99  CO2 '24 30 29 28 26  '$ GLUCOSE 247* 202* 223* 196* 221*  BUN 8 5* '12 14 9  '$ CREATININE 0.76 0.78 0.61 0.94 0.74  CALCIUM 8.6* 8.4* 8.8* 9.0 8.7*  MG 1.8  --   --   --   --    Liver Function Tests: Recent Labs  Lab 06/27/21 0142 06/28/21 0045 06/29/21 0107 06/30/21 1545  AST 37  28 23 35  ALT '30 28 26 23  '$ ALKPHOS 45 45 48 47  BILITOT 0.9 0.9 0.7 0.9  PROT 6.5 6.6 6.7 6.7  ALBUMIN 3.2* 3.3* 3.3* 3.3*   No results for input(s): LIPASE, AMYLASE in the last 168 hours. No results for input(s): AMMONIA in the last 168 hours. CBC: Recent Labs  Lab 06/28/21 0045 06/29/21 0107 06/30/21 1545 07/01/21 0511 07/02/21 0500  WBC 10.1 11.0* 9.4 9.3 9.0  HGB 12.1* 12.3* 11.0* 11.3* 11.4*  HCT 36.4* 37.3* 33.6* 34.3* 34.6*  MCV 94.3 95.4 96.3 95.0 96.4  PLT 187 207 217 210 216   Cardiac Enzymes: No results for input(s): CKTOTAL, CKMB, CKMBINDEX, TROPONINI in the last 168 hours. BNP: Invalid input(s): POCBNP CBG: Recent Labs  Lab 07/01/21 1146 07/01/21 1732 07/01/21 2046 07/02/21 0759 07/02/21 1230  GLUCAP 256* 191* 194* 204* 264*   D-Dimer No results for input(s): DDIMER in the last 72 hours. Hgb A1c No results for input(s): HGBA1C in the last 72 hours. Lipid Profile No results for input(s): CHOL, HDL, LDLCALC, TRIG, CHOLHDL, LDLDIRECT in the last 72 hours. Thyroid function studies No results for input(s): TSH, T4TOTAL, T3FREE, THYROIDAB in the last 72 hours.  Invalid input(s): FREET3 Anemia work up No results for input(s): VITAMINB12, FOLATE, FERRITIN, TIBC, IRON, RETICCTPCT in the last 72 hours. Urinalysis    Component Value Date/Time   COLORURINE COLORLESS (A) 06/05/2021 1422   APPEARANCEUR CLEAR 06/05/2021 1422    LABSPEC 1.005 06/05/2021 1422   PHURINE 7.0 06/05/2021 1422   GLUCOSEU 150 (A) 06/05/2021 1422   HGBUR SMALL (A) 06/05/2021 1422   BILIRUBINUR NEGATIVE 06/05/2021 1422   KETONESUR NEGATIVE 06/05/2021 1422   PROTEINUR NEGATIVE 06/05/2021 1422   UROBILINOGEN 0.2 05/27/2014 2312   NITRITE NEGATIVE 06/05/2021 1422   LEUKOCYTESUR NEGATIVE 06/05/2021 1422   Sepsis Labs Invalid input(s): PROCALCITONIN,  WBC,  LACTICIDVEN Microbiology No results found for this or any previous visit (from the past 240 hour(s)).   Time coordinating discharge: Over 30 minutes  SIGNED:   Little Ishikawa, DO [facility] 07/02/2021, 2:36 PM Pager   If 7PM-7AM, please contact night-coverage [email or web address]

## 2021-07-02 NOTE — Progress Notes (Signed)
Inpatient Diabetes Program Recommendations  AACE/ADA: New Consensus Statement on Inpatient Glycemic Control (2015)  Target Ranges:  Prepandial:   less than 140 mg/dL      Peak postprandial:   less than 180 mg/dL (1-2 hours)      Critically ill patients:  140 - 180 mg/dL   Lab Results  Component Value Date   GLUCAP 204 (H) 07/02/2021   HGBA1C 10.4 (H) 06/27/2021    Review of Glycemic Control  Diabetes history: DM2 Outpatient Diabetes medications: U-500 80 units TID, glipizide 10 BID Current orders for Inpatient glycemic control: Novolog 0-15 units TID with meals and 0-5 HS  HgbA1C - 10.4%  Glucose 221, 204  Inpatient Diabetes Program Recommendations:    Add Levemir 20 units BID  Follow closely.  Thank you. Lorenda Peck, RD, LDN, CDE Inpatient Diabetes Coordinator [phone]

## 2021-07-02 NOTE — Plan of Care (Signed)
Pt being discharged home

## 2021-07-02 NOTE — Progress Notes (Signed)
[facility] Progress Note  [name] 60 y.o. 1961/06/10  CC: Rectal bleeding   Subjective: Patient seen and examined at bedside.  Denies any acute GI issues.  No further bleeding episodes.  ROS : Afebrile.  Denies nausea and vomiting.   Objective: Vital signs in last 24 hours: Vitals:   07/02/21 0540 07/02/21 1004  BP: (!) 154/91 (!) 134/55  Pulse: 64 68  Resp: 20   Temp: 97.8 F (36.6 C)   SpO2: 97%     Physical Exam:  General:  With obese, not in acute distress  Head:  Normocephalic, without obvious abnormality, atraumatic  Eyes:  , EOM's intact,   Lungs:   No visible respiratory distress  Heart:  Regular rate and rhythm, S1, S2 normal  Abdomen:   Obese abdomen, soft, nontender, nondistended, bowel sounds present,   Extremities: Extremities normal, atraumatic, no  edema  Pulses: 2+ and symmetric    Lab Results: Recent Labs    07/01/21 0511 07/02/21 0500  NA 143 138  K 3.9 3.2*  CL 107 99  CO2 28 26  GLUCOSE 196* 221*  BUN 14 9  CREATININE 0.94 0.74  CALCIUM 9.0 8.7*   Recent Labs    06/30/21 1545  AST 35  ALT 23  ALKPHOS 47  BILITOT 0.9  PROT 6.7  ALBUMIN 3.3*   Recent Labs    07/01/21 0511 07/02/21 0500  WBC 9.3 9.0  HGB 11.3* 11.4*  HCT 34.3* 34.6*  MCV 95.0 96.4  PLT 210 216   Recent Labs    06/30/21 1545  LABPROT 18.5*  INR 1.5*      Assessment/Plan: -Rectal bleeding in setting of anticoagulation use.  Most likely diverticular bleed. -Acute blood loss anemia.  Hemoglobin stable -History of pulmonary embolism.  Anticoagulation on hold   Recommendations ------------------------- -No further bleeding episodes.  Hemoglobin stable. -Advance diet to soft diet. -Okay to discharge from GI standpoint.  Resume anticoagulation from Sunday if no further bleeding episodes. -Follow-up in GI clinic in 2 after discharge. -GI will sign off.  Call us back if needed   [name] MD, FACP 07/02/2021, 10:32  AM  Contact #  [phone]

## 2021-07-02 NOTE — Progress Notes (Signed)
After patient's ride being unable to bring patient's home oxygen and patient being unable to safely get home without oxygen, [facility] arranged to take patient home.   Virginia Rochester, RN

## 2021-07-03 DIAGNOSIS — K922 Gastrointestinal hemorrhage, unspecified: Secondary | ICD-10-CM | POA: Diagnosis not present

## 2021-07-03 LAB — GLUCOSE, CAPILLARY
Glucose-Capillary: 223 mg/dL — ABNORMAL HIGH (ref 70–99)
Glucose-Capillary: 302 mg/dL — ABNORMAL HIGH (ref 70–99)

## 2021-07-03 MED ORDER — SIMETHICONE 80 MG PO CHEW
80.0000 mg | CHEWABLE_TABLET | Freq: Once | ORAL | Status: AC
  Administered 2021-07-03: 80 mg via ORAL
  Filled 2021-07-03: qty 1

## 2021-07-03 NOTE — Progress Notes (Signed)
Patient ID: BERNEY PUETT, male   DOB: [birth year], 60 y.o.   MRN: [id number]  Patient complained of 9/10 chest pain. MD notified. Stat ECG obtained.    07/03/21 1010  Vitals  BP 136/74  MAP (mmHg) 83  BP Location Left Arm  BP Method Automatic  Patient Position (if appropriate) Sitting  Pulse Rate 85  Pulse Rate Source Monitor  Resp 18  Level of Consciousness  Level of Consciousness Alert  MEWS COLOR  MEWS Score Color Green  Oxygen Therapy  SpO2 94 %  O2 Device Nasal Cannula  O2 Flow Rate (L/min) 3 L/min  Patient Activity (if Appropriate) In chair  Pulse Oximetry Type Intermittent  Pain Assessment  Pain Scale 0-10  Pain Score 9  Pain Type Acute pain  Pain Location Chest  Pain Orientation Mid  Pain Descriptors / Indicators Sharp;Heaviness  MEWS Score  MEWS Temp 0  MEWS Systolic 0  MEWS Pulse 0  MEWS RR 0  MEWS LOC 0  MEWS Score 0   Haydee Salter, RN

## 2021-07-03 NOTE — Plan of Care (Signed)

## 2021-07-03 NOTE — Discharge Summary (Signed)
Physician Discharge Summary  [name] [id number] DOB: [birth year] DOA: 06/30/2021  PCP: [name], FNP  Admit date: 06/30/2021 Discharge date: 07/03/2021  Admitted From: Home Disposition: Home  Recommendations for Outpatient Follow-up:  Follow up with PCP in 1-2 weeks Please obtain BMP/CBC in one week Please follow up with GI as scheduled  Discharge Condition: Stable CODE STATUS: Full Diet recommendation: Low-salt low-fat diabetic diet  Brief/Interim Summary: [name] is a 60 year old male with past medical history significant for type 2 diabetes mellitus, essential hypertension, OSA not on CPAP, chronic diastolic congestive heart failure and morbid obesity history of recurrent recent acute PE discharged 06/29/21 on eliquis after being evaluated for acute hypoxia and chest pain. Unfortunately over the past 24h patient began having bright red stool and presented back to the hospital as directed at the time of discharge should he notice any abnormal bleeding. In ED: [facility] consulted - recommending cessation of anticoagulation and will re-evaluate in the next 24h.   Previous hospitalization 9/3 - 9/6 with progressive shortness of breath, respiratory distress and hypoxia with notable recurrent acute PE and notable recent covid viral infection now outside the window for precautions/treatment per CDC guidelines.  Patient mated with acute GI bleed in the setting of newly prescribed anticoagulant in the setting of recent PE diagnosis.  Continue to hold anticoagulation through 07/04/2021 per recommendations by GI and then resume.  If worsening bleeding at that time or symptoms of fatigue weakness or lightheadedness please return back to the hospital or seek medical attention with PCP.  Follow-up with PCP next week for repeat hemoglobin to ensure stable labs.  Patient discharged 07/02/21 but was unable to secure ride and appropriate oxygen from home to return home safely - no acute  issues/events overnight and remains medically stable for DC.   Assessment/Plan   Acute GI bleed in the setting of anticoagulation - GI consulted and continues to follow - appreciate insight and recs - Hgb stable -Advance diet as tolerated, no indication for scope at this time given bleeding has resolved with cessation of anticoagulation   Acute hypoxic respiratory failure, POA Acute pulmonary embolism, recurrent - Stable from previous hospitalization -Continue to hold anticoagulation through 07/04/2021, resume thereafter   COVID-19 viral infection - Patient reports recent diagnosis > 14 days ago - No longer requires PPE/quarantine per CDC guidelines   Essential hypertension - Continue home meds - carvedilol 12.5 mg p.o. twice daily, isosorbide mononitrate 30 mg p.o. daily, lisinopril 5 mg p.o. daily, furosemide 40 mg TID    Dyslipidemia - Continue atorvastatin 40 mg p.o. daily and fenofibrate 160 mg p.o. daily   Uncontrolled type 2 diabetes mellitus - Hemoglobin A1c 10.8 10/15/2020 - Continue sliding scale/diabetic diet   Chronic diastolic congestive heart failure Continue furosemide, carvedilol   OSA Noncompliance with CPAP outpatient.  Recommend repeat sleep study and counseling on need for CPAP.   Left lower extremity ulceration malleolus, POA Evaluated by wound care. Cleanse with soap and water, Eucerin cream covered with antimicrobial nonadherent Xeroform gauze with ABD pad and secured with Kerlix roll per last hospitalization   GERD: Continue PPI   Depression: Sertraline 50 mg p.o. daily   Morbid obesity Body mass index is 49.35 kg/m.  Discussed with patient needs for aggressive lifestyle changes/weight loss as this complicates all facets of care.  Outpatient follow-up with PCP.  May benefit from bariatric evaluation outpatient.  Discharge Diagnoses:  Active Problems:   Acute GI bleeding    Discharge Instructions  Discharge Instructions     Call MD for:   extreme fatigue   Complete by: As directed    Call MD for:  persistant dizziness or light-headedness   Complete by: As directed    Diet - low sodium heart healthy   Complete by: As directed    Increase activity slowly   Complete by: As directed    No wound care   Complete by: As directed       Allergies as of 07/03/2021       Reactions   Coconut Flavor [flavoring Agent] Hives   Coconut Oil Hives   Ibuprofen Other (See Comments)   Made gastric ulcers worse   Aleve [naproxen] Other (See Comments)   DUE TO KIDNEYS   Nsaids Other (See Comments)   Stomach ulcers   Vascepa [icosapent Ethyl] Other (See Comments)   headaches, chest pain, similar to sx of a stroke, hypotension         Medication List     STOP taking these medications    aspirin 81 MG chewable tablet       TAKE these medications    Accu-Chek FastClix Lancets Misc 1 Device by Other route 2 (two) times daily.   Accu-Chek Guide test strip Generic drug: glucose blood 1 each by Other route 2 (two) times daily.   albuterol 108 (90 Base) MCG/ACT inhaler Commonly known as: VENTOLIN HFA Inhale 2 puffs into the lungs every 6 (six) hours as needed for wheezing or shortness of breath. As Directed   apixaban 5 MG Tabs tablet Commonly known as: Eliquis Take 2 tablets ('10mg'$ ) twice daily for 7 days, then 1 tablet ('5mg'$ ) twice daily Start taking on: July 04, 2021 What changed:  how much to take how to take this when to take this additional instructions   atorvastatin 40 MG tablet Commonly known as: LIPITOR Take 40 mg by mouth at bedtime.   carvedilol 12.5 MG tablet Commonly known as: COREG Take 12.5 mg by mouth 2 (two) times daily.   famotidine 20 MG tablet Commonly known as: PEPCID Take 20 mg by mouth daily.   fenofibrate 145 MG tablet Commonly known as: TRICOR Take 1 tablet (145 mg total) by mouth daily.   furosemide 40 MG tablet Commonly known as: Lasix Take 1 tablet (40 mg total) by mouth  daily. PLEASE RESUME ONLY FROM 10/21/2020. DOSE HAS BEEN CHANGED. What changed:  when to take this additional instructions   glipiZIDE 10 MG tablet Commonly known as: GLUCOTROL Take 10 mg by mouth 2 (two) times daily.   HumuLIN R U-500 KwikPen 500 UNIT/ML KwikPen Generic drug: insulin regular human CONCENTRATED Inject 80 Units into the skin See admin instructions. Inject under the skin three times daily as directed or sliding scale:   Insulin Pen Needle 31G X 5 MM Misc Use 1 needle daily to inject insulin as prescribed What changed: how much to take   ipratropium-albuterol 0.5-2.5 (3) MG/3ML Soln Commonly known as: DUONEB Take 3 mLs by nebulization 2 (two) times daily.   isosorbide mononitrate 30 MG 24 hr tablet Commonly known as: IMDUR TAKE 1 TABLET BY MOUTH DAILY   lisinopril 5 MG tablet Commonly known as: ZESTRIL Take 5 mg by mouth daily.   nitroGLYCERIN 0.4 MG SL tablet Commonly known as: NITROSTAT Place 1 tablet (0.4 mg total) under the tongue every 5 (five) minutes as needed for chest pain.   ondansetron 4 MG tablet Commonly known as: ZOFRAN Take 4 mg by mouth 2 (two)  times daily as needed for nausea/vomiting.   Oxycodone HCl 20 MG Tabs Take 1 tablet (20 mg total) by mouth 4 (four) times daily as needed for up to 3 days. LF on 05-31-21 # 120 What changed:  how much to take reasons to take this   pantoprazole 40 MG tablet Commonly known as: PROTONIX Take 40 mg by mouth daily.   potassium chloride 10 MEQ CR capsule Commonly known as: MICRO-K Take 20 mEq by mouth daily.   sertraline 50 MG tablet Commonly known as: ZOLOFT Take 50 mg by mouth daily.   tiZANidine 4 MG tablet Commonly known as: ZANAFLEX Take 4 mg by mouth at bedtime as needed for muscle spasms.   traZODone 50 MG tablet Commonly known as: DESYREL Take 1 tablet (50 mg total) by mouth at bedtime.   Vitamin D (Ergocalciferol) 1.25 MG (50000 UNIT) Caps capsule Commonly known as: DRISDOL Take  50,000 Units by mouth every Monday.        Follow-up Information     [name], FNP Follow up.   Specialty: Family Medicine Contact information: [place] [address] [phone]         Wilford Corner, MD Follow up in 2 month(s).   Specialty: Gastroenterology Why: History of PE, recent GI bleed.  Scheduled for colonoscopy in December 2022 Contact information: [address] [place] [address] [phone]                Allergies  Allergen Reactions   Coconut Flavor [Flavoring Agent] Hives   Coconut Oil Hives   Ibuprofen Other (See Comments)    Made gastric ulcers worse   Aleve [Naproxen] Other (See Comments)    DUE TO KIDNEYS   Nsaids Other (See Comments)    Stomach ulcers    Vascepa [Icosapent Ethyl] Other (See Comments)    headaches, chest pain, similar to sx of a stroke, hypotension     Consultations: [facility]  Procedures/Studies: DG Chest 2 View  Result Date: 06/26/2021 CLINICAL DATA:  Chest pain and shortness of breath EXAM: CHEST - 2 VIEW COMPARISON:  06/15/2021 FINDINGS: Stable cardiomegaly with vascular congestion. Minor basilar atelectasis. Negative for pneumonia, CHF, effusion or pneumothorax. Trachea midline. Degenerative changes of the spine. Lateral view is limited with motion artifact. No severe compression fracture. IMPRESSION: Stable chest exam. No interval change or acute process by plain radiography. Electronically Signed   By: Jerilynn Mages.  Shick M.D.   On: 06/26/2021 12:19   DG Chest 2 View  Result Date: 06/15/2021 CLINICAL DATA:  Shortness of breath, cough, fever EXAM: CHEST - 2 VIEW COMPARISON:  Chest radiograph 06/05/2021 FINDINGS: The heart is enlarged, unchanged. The mediastinal contours are stable. There is vascular congestion without frank interstitial edema. There is no focal consolidation. There is no pleural effusion. There is no pneumothorax. There is no acute osseous abnormality.  IMPRESSION: 1. Cardiomegaly with vascular congestion but no overt pulmonary edema, overall unchanged. 2. No new focal airspace disease. Electronically Signed   By: Valetta Mole M.D.   On: 06/15/2021 11:48   DG Chest 2 View  Result Date: 06/05/2021 CLINICAL DATA:  Chest pain, shortness of breath EXAM: CHEST - 2 VIEW COMPARISON:  06/04/2021 FINDINGS: Heart size is upper limits of normal. Mild pulmonary vascular congestion. No airspace consolidation or overt pulmonary edema. No pleural effusion or pneumothorax. Degenerative changes within the thoracic spine. IMPRESSION: Mild pulmonary vascular congestion without overt pulmonary edema. Electronically Signed   By:  Nicholas  Plundo D.O.   On: 06/05/2021 12:48   CT Angio Chest PE W and/or Wo Contrast  Result Date: 06/26/2021 CLINICAL DATA:  Shortness of breath. High probability for pulmonary embolus. EXAM: CT ANGIOGRAPHY CHEST WITH CONTRAST TECHNIQUE: Multidetector CT imaging of the chest was performed using the standard protocol during bolus administration of intravenous contrast. Multiplanar CT image reconstructions and MIPs were obtained to evaluate the vascular anatomy. CONTRAST:  48m OMNIPAQUE IOHEXOL 350 MG/ML SOLN COMPARISON:  Radiograph earlier today.  Chest CTA 07/31/2019 FINDINGS: Cardiovascular: Positive for pulmonary embolus with filling defects involving the distal right main pulmonary artery extending into the upper, middle, and lower lobar branches. Thrombus is subtotally occlusive. Segmental filling defects in the left lower lobe pulmonary artery, and subsegmental left upper lobe pulmonary arterial filling defects. There is dilatation of the main pulmonary artery at 4.8 cm, chronic. Elevated RV to LV ratio of 1.08. Heart size upper normal. There is no pericardial effusion. Coronary artery calcification or stent. No aortic dissection. Mediastinum/Nodes: No enlarged mediastinal or hilar lymph nodes. Patulous esophagus without wall thickening. No  thyroid nodule. Lungs/Pleura: Patchy ground-glass opacity in the right upper lobe, slightly perifissural but not peripheral or wedge-shaped. Minor dependent atelectasis in the lower lobes. No pleural fluid. No pulmonary mass. Upper Abdomen: Enlarged liver with steatosis. No acute upper abdominal findings. Musculoskeletal: Diffuse thoracic spondylosis with endplate spurring and degenerative disc disease. No acute osseous abnormalities are seen. Review of the MIP images confirms the above findings. IMPRESSION: 1. Positive for pulmonary embolus with moderate clot burden and evidence of right heart strain, RV to LV ratio of 1.08. 2. Patchy ground-glass opacity in the right upper lobe, slightly perifissural but not peripheral or wedge-shaped. Favor pneumonia over pulmonary infarct. 3. Chronic dilatation of the main pulmonary artery consistent with pulmonary arterial hypertension. 4. Coronary artery calcification or stent. 5. Incidental hepatomegaly and steatosis in the upper abdomen. Critical Value/emergent results were called by telephone at the time of interpretation on 06/26/2021 at 6:58 pm to [facility], who verbally acknowledged these results. Electronically Signed   By: MKeith RakeM.D.   On: 06/26/2021 18:58   DG Chest Portable 1 View  Result Date: 06/30/2021 CLINICAL DATA:  Shortness of breath. EXAM: PORTABLE CHEST 1 VIEW COMPARISON:  Chest x-ray 06/26/2021.  CT chest 06/26/2021. FINDINGS: The heart is mildly enlarged. There is central pulmonary vascular congestion. There is no focal lung consolidation, pleural effusion or pneumothorax. No acute fractures are seen. IMPRESSION: 1. Cardiomegaly with central pulmonary vascular congestion. Electronically Signed   By: ARonney AstersM.D.   On: 06/30/2021 16:56   VAS [place]OWER EXTREMITY VENOUS (DVT)  Result Date: 06/28/2021  Lower Venous DVT Study Patient Name:  JSAAFIR WIESEMANN Date of Exam:   06/28/2021 Medical Rec #: [id number]        Accession  #:    [id number] of Birth: 1Jun 24, 1962       Patient Gender: M Patient Age:   89years [facility]      VAS [place]OWER EXTREMITY VENOUS (DVT) Referring Phys: JJennette Kettle--------------------------------------------------------------------------------  Indications: Pulmonary embolism.  Risk Factors: COVID 19 positive. Limitations: Body habitus, poor ultrasound/tissue interface and calf skin induration. Comparison Study: No prior studies. Performing Technologist: GOliver HumRVT  Examination Guidelines: A complete evaluation includes B-mode imaging, spectral Doppler, color Doppler, and power Doppler as needed of all accessible portions of each vessel. Bilateral testing is considered an  integral part of a complete examination. Limited examinations for reoccurring indications may be performed as noted. The reflux portion of the exam is performed with the patient in reverse Trendelenburg.  +---------+---------------+---------+-----------+----------+-------------------+ RIGHT    CompressibilityPhasicitySpontaneityPropertiesThrombus Aging      +---------+---------------+---------+-----------+----------+-------------------+ CFV      Full           Yes      Yes                                      +---------+---------------+---------+-----------+----------+-------------------+ SFJ      Full                                                             +---------+---------------+---------+-----------+----------+-------------------+ FV Prox  Full                                                             +---------+---------------+---------+-----------+----------+-------------------+ FV Mid   Full                                                             +---------+---------------+---------+-----------+----------+-------------------+ FV DistalFull                                                              +---------+---------------+---------+-----------+----------+-------------------+ PFV      Full                                                             +---------+---------------+---------+-----------+----------+-------------------+ POP      Full           Yes      Yes                                      +---------+---------------+---------+-----------+----------+-------------------+ PTV                                                   Not well visualized +---------+---------------+---------+-----------+----------+-------------------+ PERO  Not well visualized +---------+---------------+---------+-----------+----------+-------------------+   +---------+---------------+---------+-----------+----------+-------------------+ LEFT     CompressibilityPhasicitySpontaneityPropertiesThrombus Aging      +---------+---------------+---------+-----------+----------+-------------------+ CFV      Full           Yes      Yes                                      +---------+---------------+---------+-----------+----------+-------------------+ SFJ      Full                                                             +---------+---------------+---------+-----------+----------+-------------------+ FV Prox  Full                                                             +---------+---------------+---------+-----------+----------+-------------------+ FV Mid   Full                                                             +---------+---------------+---------+-----------+----------+-------------------+ FV DistalFull                                                             +---------+---------------+---------+-----------+----------+-------------------+ PFV      Full                                                             +---------+---------------+---------+-----------+----------+-------------------+  POP      Full           Yes      Yes                                      +---------+---------------+---------+-----------+----------+-------------------+ PTV                                                   Not well visualized +---------+---------------+---------+-----------+----------+-------------------+ PERO                                                  Not well visualized +---------+---------------+---------+-----------+----------+-------------------+     Summary: RIGHT: - There is no evidence of deep vein thrombosis in  the lower extremity. However, portions of this examination were limited- see technologist comments above.  - No cystic structure found in the popliteal fossa.  LEFT: - There is no evidence of deep vein thrombosis in the lower extremity. However, portions of this examination were limited- see technologist comments above.  - No cystic structure found in the popliteal fossa.  *See table(s) above for measurements and observations. Electronically signed by Deitra Mayo MD on 06/28/2021 at 3:16:58 PM.    Final    ECHOCARDIOGRAM LIMITED  Result Date: 06/28/2021    ECHOCARDIOGRAM LIMITED REPORT   Patient Name:   TYLEEK WHILES Date of Exam: 06/28/2021 Medical Rec #:  [id number]        Height:       70.0 in Accession #:    [id number]       Weight:       343.9 lb Date of Birth:  Feb 10, 1961       BSA:          2.628 m Patient Age:    67 years         BP:           133/87 mmHg Patient Gender: M                HR:           92 bpm. Exam Location:  Inpatient Procedure: Limited Echo, Cardiac Doppler, Color Doppler and Intracardiac            Opacification Agent Indications:    Pulmonary embolus  History:        Patient has prior history of Echocardiogram examinations. CHF,                 CAD; Risk Factors:Diabetes, Dyslipidemia and Hypertension. H/O                 stroke. COVID-19.  Sonographer:    Clayton Lefort RDCS (AE) Referring Phys: [phone] JARED M GARDNER  Sonographer  Comments: Technically difficult study due to poor echo windows, suboptimal apical window and patient is morbidly obese. Image acquisition challenging due to patient body habitus. COVID-19. IMPRESSIONS  1. Left ventricular ejection fraction, by estimation, is 60 to 65%. The left ventricle has normal function. The left ventricle has no regional wall motion abnormalities. There is moderate left ventricular hypertrophy.  2. Right ventricular systolic function is normal. The right ventricular size is normal.  3. The mitral valve is normal in structure. No evidence of mitral valve regurgitation. No evidence of mitral stenosis.  4. The aortic valve is normal in structure. Aortic valve regurgitation is not visualized. No aortic stenosis is present.  5. The inferior vena cava is dilated in size with >50% respiratory variability, suggesting right atrial pressure of 8 mmHg. FINDINGS  Left Ventricle: Left ventricular ejection fraction, by estimation, is 60 to 65%. The left ventricle has normal function. The left ventricle has no regional wall motion abnormalities. Definity contrast agent was given IV to delineate the left ventricular  endocardial borders. The left ventricular internal cavity size was normal in size. There is moderate left ventricular hypertrophy. Right Ventricle: The right ventricular size is normal. No increase in right ventricular wall thickness. Right ventricular systolic function is normal. Left Atrium: Left atrial size was normal in size. Right Atrium: Right atrial size was normal in size. Pericardium: There is no evidence of pericardial effusion. Mitral Valve: The mitral valve is normal in structure. No evidence of mitral valve stenosis. Tricuspid  Valve: The tricuspid valve is normal in structure. Tricuspid valve regurgitation is not demonstrated. No evidence of tricuspid stenosis. Aortic Valve: The aortic valve is normal in structure. Aortic valve regurgitation is not visualized. No aortic stenosis is  present. Pulmonic Valve: The pulmonic valve was normal in structure. Pulmonic valve regurgitation is not visualized. No evidence of pulmonic stenosis. Aorta: The aortic root is normal in size and structure. Venous: The inferior vena cava is dilated in size with greater than 50% respiratory variability, suggesting right atrial pressure of 8 mmHg. IAS/Shunts: No atrial level shunt detected by color flow Doppler. LEFT VENTRICLE PLAX 2D LVIDd:         4.10 cm  Diastology LVIDs:         3.00 cm  LV e' medial:    8.05 cm/s LV PW:         1.60 cm  LV E/e' medial:  8.1 LV IVS:        1.60 cm  LV e' lateral:   8.70 cm/s LVOT diam:     2.10 cm  LV E/e' lateral: 7.5 LVOT Area:     3.46 cm  IVC IVC diam: 2.90 cm LEFT ATRIUM         Index LA diam:    2.30 cm 0.88 cm/m   AORTA Ao Root diam: 3.60 cm Ao Asc diam:  3.70 cm MITRAL VALVE MV Area (PHT): 3.99 cm    SHUNTS MV Decel Time: 190 msec    Systemic Diam: 2.10 cm MV E velocity: 65.40 cm/s MV A velocity: 65.40 cm/s MV E/A ratio:  1.00 Jenkins Rouge MD Electronically signed by Jenkins Rouge MD Signature Date/Time: 06/28/2021/12:52:55 PM    Final      Subjective: No acute issues or events overnight   Discharge Exam: Vitals:   07/03/21 0000 07/03/21 0517  BP: (!) 145/95 (!) 170/86  Pulse:  (!) 59  Resp:  18  Temp:  98 F (36.7 C)  SpO2:  98%   Vitals:   07/02/21 1229 07/02/21 2326 07/03/21 0000 07/03/21 0517  BP: 97/65 (!) 153/115 (!) 145/95 (!) 170/86  Pulse: 71 72  (!) 59  Resp:  20  18  Temp:  98.7 F (37.1 C)  98 F (36.7 C)  TempSrc:  Oral  Oral  SpO2: 95% 95%  98%  Weight:      Height:        General: Pt is alert, awake, not in acute distress Cardiovascular: RRR, S1/S2 +, no rubs, no gallops Respiratory: CTA bilaterally, no wheezing, no rhonchi Abdominal: Soft, NT, ND, bowel sounds + Extremities: no edema, no cyanosis    The results of significant diagnostics from this hospitalization (including imaging, microbiology, ancillary and  laboratory) are listed below for reference.     Microbiology: No results found for this or any previous visit (from the past 240 hour(s)).   Labs: BNP (last 3 results) Recent Labs    06/05/21 1142 06/15/21 1102 06/26/21 1140  BNP 31.9 13.3 [id number]    Basic Metabolic Panel: Recent Labs  Lab 06/28/21 0045 06/29/21 0107 06/30/21 1545 07/01/21 0511 07/02/21 0500  NA 136 136 139 143 138  K 3.5 3.7 3.4* 3.9 3.2*  CL 101 99 103 107 99  CO2 '24 30 29 28 26  '$ GLUCOSE 247* 202* 223* 196* 221*  BUN 8 5* '12 14 9  '$ CREATININE 0.76 0.78 0.61 0.94 0.74  CALCIUM 8.6* 8.4* 8.8* 9.0 8.7*  MG 1.8  --   --   --   --  Liver Function Tests: Recent Labs  Lab 06/27/21 0142 06/28/21 0045 06/29/21 0107 06/30/21 1545  AST 37 28 23 35  ALT '30 28 26 23  '$ ALKPHOS 45 45 48 47  BILITOT 0.9 0.9 0.7 0.9  PROT 6.5 6.6 6.7 6.7  ALBUMIN 3.2* 3.3* 3.3* 3.3*    No results for input(s): LIPASE, AMYLASE in the last 168 hours. No results for input(s): AMMONIA in the last 168 hours. CBC: Recent Labs  Lab 06/28/21 0045 06/29/21 0107 06/30/21 1545 07/01/21 0511 07/02/21 0500  WBC 10.1 11.0* 9.4 9.3 9.0  HGB 12.1* 12.3* 11.0* 11.3* 11.4*  HCT 36.4* 37.3* 33.6* 34.3* 34.6*  MCV 94.3 95.4 96.3 95.0 96.4  PLT 187 207 217 210 216    Cardiac Enzymes: No results for input(s): CKTOTAL, CKMB, CKMBINDEX, TROPONINI in the last 168 hours. BNP: Invalid input(s): POCBNP CBG: Recent Labs  Lab 07/02/21 0759 07/02/21 1230 07/02/21 1819 07/02/21 2216 07/03/21 0748  GLUCAP 204* 264* 282* 232* 223*    D-Dimer No results for input(s): DDIMER in the last 72 hours. Hgb A1c No results for input(s): HGBA1C in the last 72 hours. Lipid Profile No results for input(s): CHOL, HDL, LDLCALC, TRIG, CHOLHDL, LDLDIRECT in the last 72 hours. Thyroid function studies No results for input(s): TSH, T4TOTAL, T3FREE, THYROIDAB in the last 72 hours.  Invalid input(s): FREET3 Anemia work up No results for  input(s): VITAMINB12, FOLATE, FERRITIN, TIBC, IRON, RETICCTPCT in the last 72 hours. Urinalysis    Component Value Date/Time   COLORURINE COLORLESS (A) 06/05/2021 1422   APPEARANCEUR CLEAR 06/05/2021 1422   LABSPEC 1.005 06/05/2021 1422   PHURINE 7.0 06/05/2021 1422   GLUCOSEU 150 (A) 06/05/2021 1422   HGBUR SMALL (A) 06/05/2021 1422   BILIRUBINUR NEGATIVE 06/05/2021 1422   KETONESUR NEGATIVE 06/05/2021 1422   PROTEINUR NEGATIVE 06/05/2021 1422   UROBILINOGEN 0.2 05/27/2014 2312   NITRITE NEGATIVE 06/05/2021 1422   LEUKOCYTESUR NEGATIVE 06/05/2021 1422   Sepsis Labs Invalid input(s): PROCALCITONIN,  WBC,  LACTICIDVEN Microbiology No results found for this or any previous visit (from the past 240 hour(s)).   Time coordinating discharge: Over 30 minutes  SIGNED:   Little Ishikawa, DO [facility] 07/03/2021, 8:23 AM Pager   If 7PM-7AM, please contact night-coverage [email or web address]

## 2021-07-03 NOTE — Progress Notes (Addendum)
Pt is injury free, afebrile, alert, and oriented X 4. BP was elevated 170/86, the hospitalist (Jeannette Corpus, NP) on call was notified. The rest of vital signs were within the baseline during. Pt still waiting for transport to be discharged home. Pt denies chest pain, SOB, nausea, vomiting, dizziness, signs or symptoms of bleeding or infection or acute changes during this shift. We will continue to monitor and work toward achieving the goals of the care plan.

## 2021-07-06 ENCOUNTER — Other Ambulatory Visit (HOSPITAL_COMMUNITY): Payer: Medicaid Other

## 2021-07-08 ENCOUNTER — Ambulatory Visit (HOSPITAL_BASED_OUTPATIENT_CLINIC_OR_DEPARTMENT_OTHER): Payer: Medicaid Other | Admitting: Family

## 2021-07-15 ENCOUNTER — Inpatient Hospital Stay (HOSPITAL_COMMUNITY)
Admission: EM | Admit: 2021-07-15 | Discharge: 2021-07-18 | DRG: 378 | Disposition: A | Discharge status: Home / Self Care | Payer: Medicaid Other | Attending: Internal Medicine | Admitting: Internal Medicine

## 2021-07-15 ENCOUNTER — Emergency Department (HOSPITAL_COMMUNITY): Payer: Medicaid Other

## 2021-07-15 ENCOUNTER — Other Ambulatory Visit: Payer: Self-pay

## 2021-07-15 ENCOUNTER — Encounter (HOSPITAL_COMMUNITY): Payer: Self-pay | Admitting: Oncology

## 2021-07-15 DIAGNOSIS — Z8249 Family history of ischemic heart disease and other diseases of the circulatory system: Secondary | ICD-10-CM

## 2021-07-15 DIAGNOSIS — K5731 Diverticulosis of large intestine without perforation or abscess with bleeding: Secondary | ICD-10-CM | POA: Diagnosis present

## 2021-07-15 DIAGNOSIS — I110 Hypertensive heart disease with heart failure: Secondary | ICD-10-CM | POA: Diagnosis present

## 2021-07-15 DIAGNOSIS — K635 Polyp of colon: Secondary | ICD-10-CM | POA: Diagnosis present

## 2021-07-15 DIAGNOSIS — Z794 Long term (current) use of insulin: Secondary | ICD-10-CM

## 2021-07-15 DIAGNOSIS — R040 Epistaxis: Secondary | ICD-10-CM | POA: Diagnosis present

## 2021-07-15 DIAGNOSIS — I2699 Other pulmonary embolism without acute cor pulmonale: Secondary | ICD-10-CM | POA: Diagnosis not present

## 2021-07-15 DIAGNOSIS — E1165 Type 2 diabetes mellitus with hyperglycemia: Secondary | ICD-10-CM | POA: Diagnosis present

## 2021-07-15 DIAGNOSIS — I2510 Atherosclerotic heart disease of native coronary artery without angina pectoris: Secondary | ICD-10-CM | POA: Diagnosis present

## 2021-07-15 DIAGNOSIS — Z9114 Patient's other noncompliance with medication regimen: Secondary | ICD-10-CM

## 2021-07-15 DIAGNOSIS — K922 Gastrointestinal hemorrhage, unspecified: Secondary | ICD-10-CM | POA: Diagnosis not present

## 2021-07-15 DIAGNOSIS — Z6841 Body Mass Index (BMI) 40.0 and over, adult: Secondary | ICD-10-CM | POA: Diagnosis not present

## 2021-07-15 DIAGNOSIS — E876 Hypokalemia: Secondary | ICD-10-CM | POA: Diagnosis present

## 2021-07-15 DIAGNOSIS — F32A Depression, unspecified: Secondary | ICD-10-CM | POA: Diagnosis present

## 2021-07-15 DIAGNOSIS — K219 Gastro-esophageal reflux disease without esophagitis: Secondary | ICD-10-CM | POA: Diagnosis present

## 2021-07-15 DIAGNOSIS — Z86711 Personal history of pulmonary embolism: Secondary | ICD-10-CM | POA: Diagnosis not present

## 2021-07-15 DIAGNOSIS — K625 Hemorrhage of anus and rectum: Secondary | ICD-10-CM | POA: Diagnosis present

## 2021-07-15 DIAGNOSIS — E785 Hyperlipidemia, unspecified: Secondary | ICD-10-CM | POA: Diagnosis present

## 2021-07-15 DIAGNOSIS — Z8616 Personal history of COVID-19: Secondary | ICD-10-CM | POA: Diagnosis not present

## 2021-07-15 DIAGNOSIS — K2901 Acute gastritis with bleeding: Secondary | ICD-10-CM | POA: Diagnosis present

## 2021-07-15 DIAGNOSIS — Z7901 Long term (current) use of anticoagulants: Secondary | ICD-10-CM | POA: Diagnosis not present

## 2021-07-15 DIAGNOSIS — J449 Chronic obstructive pulmonary disease, unspecified: Secondary | ICD-10-CM | POA: Diagnosis present

## 2021-07-15 DIAGNOSIS — G4733 Obstructive sleep apnea (adult) (pediatric): Secondary | ICD-10-CM | POA: Diagnosis present

## 2021-07-15 DIAGNOSIS — Z833 Family history of diabetes mellitus: Secondary | ICD-10-CM

## 2021-07-15 DIAGNOSIS — Z86718 Personal history of other venous thrombosis and embolism: Secondary | ICD-10-CM | POA: Diagnosis not present

## 2021-07-15 DIAGNOSIS — Z79899 Other long term (current) drug therapy: Secondary | ICD-10-CM | POA: Diagnosis not present

## 2021-07-15 DIAGNOSIS — Z886 Allergy status to analgesic agent status: Secondary | ICD-10-CM

## 2021-07-15 DIAGNOSIS — Z955 Presence of coronary angioplasty implant and graft: Secondary | ICD-10-CM

## 2021-07-15 DIAGNOSIS — Z8673 Personal history of transient ischemic attack (TIA), and cerebral infarction without residual deficits: Secondary | ICD-10-CM | POA: Diagnosis not present

## 2021-07-15 DIAGNOSIS — I5032 Chronic diastolic (congestive) heart failure: Secondary | ICD-10-CM | POA: Diagnosis present

## 2021-07-15 DIAGNOSIS — J9611 Chronic respiratory failure with hypoxia: Secondary | ICD-10-CM | POA: Diagnosis present

## 2021-07-15 DIAGNOSIS — R9431 Abnormal electrocardiogram [ECG] [EKG]: Secondary | ICD-10-CM

## 2021-07-15 DIAGNOSIS — Z888 Allergy status to other drugs, medicaments and biological substances status: Secondary | ICD-10-CM

## 2021-07-15 HISTORY — DX: Other pulmonary embolism without acute cor pulmonale: I26.99

## 2021-07-15 LAB — COMPREHENSIVE METABOLIC PANEL
ALT: 26 U/L (ref 0–44)
AST: 25 U/L (ref 15–41)
Albumin: 3.7 g/dL (ref 3.5–5.0)
Alkaline Phosphatase: 45 U/L (ref 38–126)
Anion gap: 11 (ref 5–15)
BUN: 15 mg/dL (ref 6–20)
CO2: 28 mmol/L (ref 22–32)
Calcium: 9.4 mg/dL (ref 8.9–10.3)
Chloride: 102 mmol/L (ref 98–111)
Creatinine, Ser: 0.77 mg/dL (ref 0.61–1.24)
GFR, Estimated: >60 mL/min (ref >60)
Glucose, Bld: 228 mg/dL — ABNORMAL HIGH (ref 70–99)
Potassium: 3.4 mmol/L — ABNORMAL LOW (ref 3.5–5.1)
Sodium: 141 mmol/L (ref 135–145)
Total Bilirubin: 0.8 mg/dL (ref 0.3–1.2)
Total Protein: 7.3 g/dL (ref 6.5–8.1)

## 2021-07-15 LAB — GLUCOSE, CAPILLARY
Glucose-Capillary: 160 mg/dL — ABNORMAL HIGH (ref 70–99)
Glucose-Capillary: 169 mg/dL — ABNORMAL HIGH (ref 70–99)

## 2021-07-15 LAB — TYPE AND SCREEN
ABO/RH(D): O POS
Antibody Screen: NEGATIVE

## 2021-07-15 LAB — CBC
HCT: 34.8 % — ABNORMAL LOW (ref 39.0–52.0)
Hemoglobin: 11.5 g/dL — ABNORMAL LOW (ref 13.0–17.0)
MCH: 31.6 pg (ref 26.0–34.0)
MCHC: 33 g/dL (ref 30.0–36.0)
MCV: 95.6 fL (ref 80.0–100.0)
Platelets: 246 10*3/uL (ref 150–400)
RBC: 3.64 MIL/uL — ABNORMAL LOW (ref 4.22–5.81)
RDW: 13.8 % (ref 11.5–15.5)
WBC: 8.5 10*3/uL (ref 4.0–10.5)
nRBC: 0 % (ref 0.0–0.2)

## 2021-07-15 LAB — TROPONIN I (HIGH SENSITIVITY): Troponin I (High Sensitivity): 8 ng/L (ref <18)

## 2021-07-15 LAB — BRAIN NATRIURETIC PEPTIDE: B Natriuretic Peptide: 37.7 pg/mL (ref 0.0–100.0)

## 2021-07-15 MED ORDER — PANTOPRAZOLE SODIUM 40 MG IV SOLR
40.0000 mg | Freq: Once | INTRAVENOUS | Status: AC
  Administered 2021-07-15: 40 mg via INTRAVENOUS
  Filled 2021-07-15: qty 40

## 2021-07-15 MED ORDER — INSULIN ASPART 100 UNIT/ML IJ SOLN
0.0000 [IU] | Freq: Every day | INTRAMUSCULAR | Status: DC
Start: 2021-07-15 — End: 2021-07-18
  Administered 2021-07-17: 3 [IU] via SUBCUTANEOUS

## 2021-07-15 MED ORDER — INSULIN REGULAR HUMAN (CONC) 500 UNIT/ML ~~LOC~~ SOPN: 40.0000 [IU] | PEN_INJECTOR | SUBCUTANEOUS | Status: DC

## 2021-07-15 MED ORDER — IPRATROPIUM-ALBUTEROL 0.5-2.5 (3) MG/3ML IN SOLN
3.0000 mL | Freq: Two times a day (BID) | RESPIRATORY_TRACT | Status: DC
  Administered 2021-07-15 – 2021-07-17 (×5): 3 mL via RESPIRATORY_TRACT
  Filled 2021-07-15 (×5): qty 3

## 2021-07-15 MED ORDER — DOXYLAMINE SUCCINATE (SLEEP) 25 MG PO TABS
25.0000 mg | ORAL_TABLET | Freq: Once | ORAL | Status: AC
  Administered 2021-07-15: 25 mg via ORAL
  Filled 2021-07-15: qty 1

## 2021-07-15 MED ORDER — SODIUM CHLORIDE 0.9% FLUSH
3.0000 mL | Freq: Two times a day (BID) | INTRAVENOUS | Status: DC
  Administered 2021-07-15 – 2021-07-18 (×4): 3 mL via INTRAVENOUS

## 2021-07-15 MED ORDER — HEPARIN (PORCINE) 25000 UT/250ML-% IV SOLN
2500.0000 [IU]/h | INTRAVENOUS | Status: DC
  Administered 2021-07-15 – 2021-07-16 (×2): 2500 [IU]/h via INTRAVENOUS
  Filled 2021-07-15 (×3): qty 250

## 2021-07-15 MED ORDER — POTASSIUM CHLORIDE CRYS ER 20 MEQ PO TBCR
20.0000 meq | EXTENDED_RELEASE_TABLET | Freq: Every day | ORAL | Status: DC
  Administered 2021-07-16: 20 meq via ORAL
  Filled 2021-07-15: qty 1

## 2021-07-15 MED ORDER — ALBUTEROL SULFATE (2.5 MG/3ML) 0.083% IN NEBU
3.0000 mL | INHALATION_SOLUTION | Freq: Four times a day (QID) | RESPIRATORY_TRACT | Status: DC | PRN
  Administered 2021-07-15: 3 mL via RESPIRATORY_TRACT
  Filled 2021-07-15: qty 3

## 2021-07-15 MED ORDER — PANTOPRAZOLE SODIUM 40 MG IV SOLR
40.0000 mg | Freq: Two times a day (BID) | INTRAVENOUS | Status: DC
  Administered 2021-07-16 – 2021-07-18 (×6): 40 mg via INTRAVENOUS
  Filled 2021-07-15 (×6): qty 40

## 2021-07-15 MED ORDER — INSULIN ASPART 100 UNIT/ML IJ SOLN
0.0000 [IU] | Freq: Three times a day (TID) | INTRAMUSCULAR | Status: DC
  Administered 2021-07-16 – 2021-07-17 (×4): 3 [IU] via SUBCUTANEOUS
  Administered 2021-07-17: 8 [IU] via SUBCUTANEOUS
  Administered 2021-07-17: 3 [IU] via SUBCUTANEOUS
  Administered 2021-07-18: 8 [IU] via SUBCUTANEOUS
  Administered 2021-07-18: 5 [IU] via SUBCUTANEOUS

## 2021-07-15 MED ORDER — OXYCODONE HCL 5 MG PO TABS
20.0000 mg | ORAL_TABLET | Freq: Four times a day (QID) | ORAL | Status: AC | PRN
  Administered 2021-07-15 – 2021-07-16 (×2): 20 mg via ORAL
  Filled 2021-07-15 (×2): qty 4

## 2021-07-15 MED ORDER — SODIUM CHLORIDE 0.9% FLUSH: 3.0000 mL | INTRAVENOUS | Status: DC | PRN

## 2021-07-15 MED ORDER — CARVEDILOL 12.5 MG PO TABS
12.5000 mg | ORAL_TABLET | Freq: Two times a day (BID) | ORAL | Status: DC
  Administered 2021-07-15 – 2021-07-18 (×6): 12.5 mg via ORAL
  Filled 2021-07-15 (×6): qty 1

## 2021-07-15 MED ORDER — SODIUM CHLORIDE 0.9 % IV SOLN: 250.0000 mL | INTRAVENOUS | Status: DC | PRN

## 2021-07-15 MED ORDER — ACETAMINOPHEN 650 MG RE SUPP: 650.0000 mg | Freq: Four times a day (QID) | RECTAL | Status: DC | PRN

## 2021-07-15 MED ORDER — FUROSEMIDE 40 MG PO TABS
40.0000 mg | ORAL_TABLET | Freq: Three times a day (TID) | ORAL | Status: DC
  Administered 2021-07-16 – 2021-07-18 (×5): 40 mg via ORAL
  Filled 2021-07-15 (×6): qty 1

## 2021-07-15 MED ORDER — PEG 3350-KCL-NA BICARB-NACL 420 G PO SOLR
4000.0000 mL | Freq: Once | ORAL | Status: AC
  Administered 2021-07-16: 4000 mL via ORAL

## 2021-07-15 MED ORDER — NITROGLYCERIN 0.4 MG SL SUBL: 0.4000 mg | SUBLINGUAL_TABLET | SUBLINGUAL | Status: DC | PRN

## 2021-07-15 MED ORDER — MORPHINE SULFATE (PF) 2 MG/ML IV SOLN
1.0000 mg | INTRAVENOUS | Status: DC | PRN
Start: 2021-07-15 — End: 2021-07-15
  Administered 2021-07-15: 1 mg via INTRAVENOUS
  Filled 2021-07-15: qty 1

## 2021-07-15 MED ORDER — ACETAMINOPHEN 325 MG PO TABS
650.0000 mg | ORAL_TABLET | Freq: Four times a day (QID) | ORAL | Status: DC | PRN
  Administered 2021-07-16: 650 mg via ORAL
  Filled 2021-07-15 (×2): qty 2

## 2021-07-15 MED ORDER — INSULIN ASPART 100 UNIT/ML IJ SOLN
4.0000 [IU] | Freq: Three times a day (TID) | INTRAMUSCULAR | Status: DC
  Administered 2021-07-15 – 2021-07-16 (×3): 4 [IU] via SUBCUTANEOUS

## 2021-07-15 MED ORDER — HYDROCERIN EX CREA
TOPICAL_CREAM | Freq: Two times a day (BID) | CUTANEOUS | Status: DC
  Filled 2021-07-15 (×2): qty 113

## 2021-07-15 MED ORDER — INSULIN ASPART 100 UNIT/ML IJ SOLN
0.0000 [IU] | INTRAMUSCULAR | Status: DC
Start: 2021-07-15 — End: 2021-07-15
  Administered 2021-07-15: 3 [IU] via SUBCUTANEOUS

## 2021-07-15 MED ORDER — INSULIN GLARGINE-YFGN 100 UNIT/ML ~~LOC~~ SOLN
15.0000 [IU] | Freq: Two times a day (BID) | SUBCUTANEOUS | Status: DC
  Administered 2021-07-15 – 2021-07-16 (×2): 15 [IU] via SUBCUTANEOUS
  Filled 2021-07-15 (×3): qty 0.15

## 2021-07-15 MED ORDER — POLYETHYLENE GLYCOL 3350 17 GM/SCOOP PO POWD
1.0000 | Freq: Once | ORAL | Status: AC
  Administered 2021-07-15: 255 g via ORAL
  Filled 2021-07-15: qty 255

## 2021-07-15 MED ORDER — ISOSORBIDE MONONITRATE ER 30 MG PO TB24
30.0000 mg | ORAL_TABLET | Freq: Every day | ORAL | Status: DC
  Administered 2021-07-16 – 2021-07-18 (×3): 30 mg via ORAL
  Filled 2021-07-15 (×3): qty 1

## 2021-07-15 NOTE — ED Triage Notes (Signed)
Pt presents from home d/t intermittent nose bleeds.  Pt also reporting having a dark stool this AM.  Pt does take eloquis.

## 2021-07-15 NOTE — Consult Note (Signed)
Referring Provider: Dr. Tana Coast Primary Care Physician:  [name], FNP Primary Gastroenterologist:  Dr. Michail Sermon  Reason for Consultation:  GI bleed  HPI: SEKOU ZUCKERMAN is a 60 y.o. male with recent admissions for GI bleeding and was recently discharged on 07/03/21. Found to have PE in early September and started on Eliquis which was held until 07/04/21. Did ok without bleeding until 3 days ago when he started having profuse rectal bleeding described as BRBPR multiple times per day with minimal cramping. Denies black stools. Denies N/V. Has been having recurrent epistaxis. He had a poorly prepped colonoscopy in July 2021 where an adenomatous polyp was removed and plan was repeat colonoscopy in 12/22. Hgb 11.5 (11.4 on 07/02/21). Last dose of Eliquis this morning. Denies any alcohol in 35 years and then drank occasionally. Denies NSAIDs. EGD in 7/21 showed gastritis.   Past Medical History:  Diagnosis Date   Anemia    Arthritis    Back pain    CAD (coronary artery disease)    a. s/p DES to LAD in 05/2016   Cervical radiculopathy    Chronic diastolic CHF (congestive heart failure) (HCC)    Chronic pain    Depression    DVT (deep venous thrombosis) (HCC)    Hematemesis    Hepatic steatosis    Hyperlipidemia    Hypertension    IBS (irritable bowel syndrome)    Morbid obesity (HCC)    OSA (obstructive sleep apnea)    Pancreatitis    PE (pulmonary thromboembolism) ([place])    Peripheral neuropathy    PUD (peptic ulcer disease)    Renal disorder    Stroke [facility])    a. ?details unclear - not seen on imaging when he was admitted in 05/2017 for TIA symptoms which were felt due to cervical radiculopathy.   Thoracic aortic ectasia (HCC)    a. 4.3cm ectatic ascending thoracic aorta by CT 06/2017.    Type 2 diabetes mellitus ([place])     Past Surgical History:  Procedure Laterality Date   CARDIAC CATHETERIZATION N/A 05/31/2016   Procedure: Left Heart Cath and Coronary Angiography;  Surgeon: Peter  M Martinique, MD;  Location: [place] [facility];  Service: Cardiovascular;  Laterality: N/A;   CARDIAC CATHETERIZATION N/A 05/31/2016   Procedure: Intravascular Pressure Wire/FFR Study;  Surgeon: Peter M Martinique, MD;  Location: [place] [facility];  Service: Cardiovascular;  Laterality: N/A;   CARDIAC CATHETERIZATION N/A 05/31/2016   Procedure: Coronary Stent Intervention;  Surgeon: Peter M Martinique, MD;  Location: [place] [facility];  Service: Cardiovascular;  Laterality: N/A;   COLONOSCOPY WITH PROPOFOL N/A 04/29/2020   Procedure: COLONOSCOPY WITH PROPOFOL;  Surgeon: Wilford Corner, MD;  Location: [place];  Service: Endoscopy;  Laterality: N/A;   ESOPHAGOGASTRODUODENOSCOPY N/A 04/29/2020   Procedure: ESOPHAGOGASTRODUODENOSCOPY (EGD);  Surgeon: Wilford Corner, MD;  Location: [place];  Service: Endoscopy;  Laterality: N/A;   LEFT HEART CATH AND CORONARY ANGIOGRAPHY N/A 12/08/2017   Procedure: LEFT HEART CATH AND CORONARY ANGIOGRAPHY;  Surgeon: Leonie Man, MD;  Location: [place] [facility];  Service: Cardiovascular;  Laterality: N/A;   LEFT HEART CATHETERIZATION WITH CORONARY ANGIOGRAM N/A 02/03/2014   Procedure: LEFT HEART CATHETERIZATION WITH CORONARY ANGIOGRAM;  Surgeon: Pixie Casino, MD;  Location: [facility] CATH LAB;  Service: Cardiovascular;  Laterality: N/A;   left leg stent      POLYPECTOMY  04/29/2020   Procedure: POLYPECTOMY;  Surgeon: Wilford Corner, MD;  Location: [facility] ENDOSCOPY;  Service: Endoscopy;;  Prior to Admission medications   Medication Sig Start Date End Date Taking? Authorizing Provider  Accu-Chek FastClix Lancets MISC 1 Device by Other route 2 (two) times daily.  03/20/19   [provider]  ACCU-CHEK GUIDE test strip 1 each by Other route 2 (two) times daily. 01/08/21   [provider]  albuterol (VENTOLIN HFA) 108 (90 Base) MCG/ACT inhaler Inhale 2 puffs into the lungs every 6 (six) hours as needed for wheezing or shortness of breath. As Directed     [provider]  apixaban (ELIQUIS) 5 MG TABS tablet Take 2 tablets (10mg ) twice daily for 7 days, then 1 tablet (5mg ) twice daily 07/04/21   Little Ishikawa, MD  atorvastatin (LIPITOR) 40 MG tablet Take 40 mg by mouth at bedtime.    [provider]  carvedilol (COREG) 12.5 MG tablet Take 12.5 mg by mouth 2 (two) times daily. 10/22/20   [provider]  famotidine (PEPCID) 20 MG tablet Take 20 mg by mouth daily. 03/05/20   [provider]  fenofibrate (TRICOR) 145 MG tablet Take 1 tablet (145 mg total) by mouth daily. 06/25/21   Deberah Pelton, NP  furosemide (LASIX) 40 MG tablet Take 1 tablet (40 mg total) by mouth daily. PLEASE RESUME ONLY FROM 10/21/2020. DOSE HAS BEEN CHANGED. Patient taking differently: Take 40 mg by mouth 3 (three) times daily. 10/21/20 10/21/21  Bonnielee Haff, MD  glipiZIDE (GLUCOTROL) 10 MG tablet Take 10 mg by mouth 2 (two) times daily. 05/30/17   [provider]  HUMULIN R U-500 KWIKPEN 500 UNIT/ML kwikpen Inject 80 Units into the skin See admin instructions. Inject under the skin three times daily as directed or sliding scale: 06/04/20   [provider]  Insulin Pen Needle 31G X 5 MM MISC Use 1 needle daily to inject insulin as prescribed Patient taking differently: 1 each. Use 1 needle daily to inject insulin as prescribed 06/18/17   Barton Dubois, MD  ipratropium-albuterol (DUONEB) 0.5-2.5 (3) MG/3ML SOLN Take 3 mLs by nebulization 2 (two) times daily. 06/24/21   [provider]  isosorbide mononitrate (IMDUR) 30 MG 24 hr tablet TAKE 1 TABLET BY MOUTH DAILY Patient taking differently: Take 30 mg by mouth daily. 12/24/20   Hilty, Nadean Corwin, MD  lisinopril (ZESTRIL) 5 MG tablet Take 5 mg by mouth daily. 12/04/20   [provider]  nitroGLYCERIN (NITROSTAT) 0.4 MG SL tablet Place 1 tablet (0.4 mg total) under the tongue every 5 (five) minutes as needed for chest pain. 02/06/19   Skeet Latch, MD   ondansetron (ZOFRAN) 4 MG tablet Take 4 mg by mouth 2 (two) times daily as needed for nausea/vomiting. 04/21/20   [provider]  pantoprazole (PROTONIX) 40 MG tablet Take 40 mg by mouth daily. 04/13/21   [provider]  potassium chloride (MICRO-K) 10 MEQ CR capsule Take 20 mEq by mouth daily. 04/03/20   [provider]  sertraline (ZOLOFT) 50 MG tablet Take 50 mg by mouth daily. 12/17/20   [provider]  tiZANidine (ZANAFLEX) 4 MG tablet Take 4 mg by mouth at bedtime as needed for muscle spasms. 03/31/20   [provider]  traZODone (DESYREL) 50 MG tablet Take 1 tablet (50 mg total) by mouth at bedtime. 12/15/16   Florinda Marker, MD  Vitamin D, Ergocalciferol, (DRISDOL) 1.25 MG (50000 UT) CAPS capsule Take 50,000 Units by mouth every Monday. 08/15/19   [provider]    Scheduled Meds:  carvedilol  12.5 mg Oral BID   furosemide  40 mg Oral TID   insulin aspart  0-15 Units Subcutaneous Q4H   insulin regular human CONCENTRATED  40 Units Subcutaneous See admin instructions   ipratropium-albuterol  3 mL Nebulization BID   isosorbide mononitrate  30 mg Oral Daily   [START ON 07/16/2021] pantoprazole (PROTONIX) IV  40 mg Intravenous Q12H   potassium chloride  20 mEq Oral Daily   sodium chloride flush  3 mL Intravenous Q12H   Continuous Infusions:  sodium chloride     PRN Meds:.sodium chloride, acetaminophen **OR** acetaminophen, albuterol, morphine injection, nitroGLYCERIN, sodium chloride flush  Allergies as of 07/15/2021 - Review Complete 07/15/2021  Allergen Reaction Noted   Coconut flavor [flavoring agent] Hives 01/26/2017   Coconut oil Hives 03/09/2016   Ibuprofen Other (See Comments) 01/29/2014   Aleve [naproxen] Other (See Comments) 09/02/2017   Nsaids Other (See Comments) 12/27/2017   Vascepa [icosapent ethyl] Other (See Comments) 09/30/2019    Family History  Problem Relation Age of Onset   Cancer Father    Hypertension  Mother    Diabetes Mother    Breast cancer Mother    Hypertension Brother    Diabetes Brother    Hypertension Sister    Diabetes Sister     Social History   Socioeconomic History   Marital status: Single    Spouse name: Not on file   Number of children: 3   Years of education: 12   Highest education level: Not on file  Occupational History   Occupation: Disabled  Tobacco Use   Smoking status: Never   Smokeless tobacco: Never  Vaping Use   Vaping Use: Never used  Substance and Sexual Activity   Alcohol use: No   Drug use: No   Sexual activity: Not on file  Other Topics Concern   Not on file  Social History Narrative   Independent and ambulatory with cane.   Lives at home alone.   Right-handed.   1-2 cups caffeine per day.   Social Determinants of Health   Financial Resource Strain: Not on file  Food Insecurity: Not on file  Transportation Needs: Not on file  Physical Activity: Not on file  Stress: Not on file  Social Connections: Not on file  Intimate Partner Violence: Not on file    Review of Systems: All negative except as stated above in HPI.  Physical Exam: Vital signs: Vitals:   07/15/21 1522 07/15/21 1558  BP:  128/80  Pulse: 74 72  Resp: 18 18  Temp: (!) 97.2 F (36.2 C) 98.6 F (37 C)  SpO2: 90% 96%     General:   Lethargic, morbidly obese, pleasant and cooperative in NAD Head: normocephalic, atraumatic Eyes: anicteric sclera ENT: oropharynx clear Neck: supple, nontender Lungs:  Clear throughout to auscultation.   No wheezes, crackles, or rhonchi. No acute distress. Heart:  Regular rate and rhythm; no murmurs, clicks, rubs,  or gallops. Abdomen: soft, nontender, nondistended, +BS  Rectal:  Deferred Ext: chronic vascular changes with LE edema  GI:  Lab Results: Recent Labs    07/15/21 1236  WBC 8.5  HGB 11.5*  HCT 34.8*  PLT 246   BMET Recent Labs    07/15/21 1236  NA 141  K 3.4*  CL 102  CO2 28  GLUCOSE 228*  BUN 15   CREATININE 0.77  CALCIUM 9.4   LFT Recent Labs    07/15/21 1236  PROT 7.3  ALBUMIN 3.7  AST 25  ALT 26  ALKPHOS 45  BILITOT 0.8   PT/INR No results for input(s): LABPROT, INR in the last 72 hours.   Studies/Results: DG Chest Port 1 View  Result Date: 07/15/2021 CLINICAL DATA:  Intermittent nosebleeds. EXAM: PORTABLE CHEST 1 VIEW COMPARISON:  06/30/2021 FINDINGS: The cardiac silhouette, mediastinal and hilar contours are within normal limits and stable. The lungs are clear. No pleural effusions. The bony thorax is intact. IMPRESSION: No acute cardiopulmonary findings. Electronically Signed   By: Marijo Sanes M.D.   On: 07/15/2021 13:41    Impression/Plan: Profuse hematochezia likely due to diverticular bleed but malignancy also in differential doubt ischemic colitis. Colonoscopy this admit. Two-three days of prep needed and hold Eliquis. Clear liquid diet. Consider doing colonoscopy Saturday if stools clear. Supportive care.     LOS: 0 days   Lear Ng  07/15/2021, 4:23 PM  Questions please call [phone]

## 2021-07-15 NOTE — Progress Notes (Signed)
[place] for IV heparin Indication: recent PE, DOAC on hold  Allergies  Allergen Reactions   Coconut Flavor [Flavoring Agent] Hives   Coconut Oil Hives   Ibuprofen Other (See Comments)    Made gastric ulcers worse   Aleve [Naproxen] Other (See Comments)    DUE TO KIDNEYS   Nsaids Other (See Comments)    Stomach ulcers    Vascepa [Icosapent Ethyl] Other (See Comments)    headaches, chest pain, similar to sx of a stroke, hypotension     Patient Measurements:   Heparin Dosing Weight: 111 kg (as of 9/7)  Vital Signs: Temp: 98.6 F (37 C) (09/22 1558) Temp Source: Oral (09/22 1522) BP: 128/80 (09/22 1558) Pulse Rate: 77 (09/22 1736)  Labs: Recent Labs    07/15/21 1236 07/15/21 1611  HGB 11.5*  --   HCT 34.8*  --   PLT 246  --   CREATININE 0.77  --   TROPONINIHS  --  8    CrCl cannot be calculated (Unknown ideal weight.).   Medical History: Past Medical History:  Diagnosis Date   Anemia    Arthritis    Back pain    CAD (coronary artery disease)    a. s/p DES to LAD in 05/2016   Cervical radiculopathy    Chronic diastolic CHF (congestive heart failure) (HCC)    Chronic pain    Depression    DVT (deep venous thrombosis) (HCC)    Hematemesis    Hepatic steatosis    Hyperlipidemia    Hypertension    IBS (irritable bowel syndrome)    Morbid obesity (HCC)    OSA (obstructive sleep apnea)    Pancreatitis    PE (pulmonary thromboembolism) ([place])    Peripheral neuropathy    PUD (peptic ulcer disease)    Renal disorder    Stroke [facility])    a. ?details unclear - not seen on imaging when he was admitted in 05/2017 for TIA symptoms which were felt due to cervical radiculopathy.   Thoracic aortic ectasia (HCC)    a. 4.3cm ectatic ascending thoracic aorta by CT 06/2017.    Type 2 diabetes mellitus (HCC)     Medications:  Medications Prior to Admission  Medication Sig Dispense Refill Last Dose   albuterol (VENTOLIN HFA) 108  (90 Base) MCG/ACT inhaler Inhale 2 puffs into the lungs every 6 (six) hours as needed for wheezing or shortness of breath. As Directed   07/14/2021   apixaban (ELIQUIS) 5 MG TABS tablet Take 2 tablets (10mg ) twice daily for 7 days, then 1 tablet (5mg ) twice daily (Patient taking differently: Take 5 mg by mouth 2 (two) times daily.) 60 tablet 0 07/15/2021 at 0900   atorvastatin (LIPITOR) 40 MG tablet Take 40 mg by mouth at bedtime.   07/14/2021   carvedilol (COREG) 12.5 MG tablet Take 12.5 mg by mouth 2 (two) times daily.   07/15/2021   famotidine (PEPCID) 20 MG tablet Take 20 mg by mouth daily.   07/14/2021   fenofibrate (TRICOR) 145 MG tablet Take 1 tablet (145 mg total) by mouth daily. 90 tablet 2 07/15/2021   furosemide (LASIX) 40 MG tablet Take 1 tablet (40 mg total) by mouth daily. PLEASE RESUME ONLY FROM 10/21/2020. DOSE HAS BEEN CHANGED. (Patient taking differently: Take 40 mg by mouth 3 (three) times daily.) 90 tablet 0 07/15/2021   glipiZIDE (GLUCOTROL) 10 MG tablet Take 10 mg by mouth 2 (two) times daily.  4 07/15/2021  HUMULIN R U-500 KWIKPEN 500 UNIT/ML kwikpen Inject 80-90 Units into the skin in the morning, at noon, and at bedtime. Per sliding scale   07/15/2021   ipratropium-albuterol (DUONEB) 0.5-2.5 (3) MG/3ML SOLN Take 3 mLs by nebulization 2 (two) times daily.   07/14/2021   isosorbide mononitrate (IMDUR) 30 MG 24 hr tablet TAKE 1 TABLET BY MOUTH DAILY (Patient taking differently: Take 30 mg by mouth daily.) 90 tablet 3 07/15/2021   lisinopril (ZESTRIL) 5 MG tablet Take 5 mg by mouth daily.   07/15/2021   nitroGLYCERIN (NITROSTAT) 0.4 MG SL tablet Place 1 tablet (0.4 mg total) under the tongue every 5 (five) minutes as needed for chest pain. 100 tablet 1 unk   ondansetron (ZOFRAN) 4 MG tablet Take 4 mg by mouth 2 (two) times daily as needed for nausea/vomiting.   Past Week   Oxycodone HCl 20 MG TABS Take 20 mg by mouth 4 (four) times daily as needed (pain).   07/14/2021   pantoprazole  (PROTONIX) 40 MG tablet Take 40 mg by mouth daily.   07/15/2021   potassium chloride (MICRO-K) 10 MEQ CR capsule Take 20 mEq by mouth daily.   07/15/2021   sertraline (ZOLOFT) 50 MG tablet Take 50 mg by mouth daily.   07/15/2021   tiZANidine (ZANAFLEX) 4 MG tablet Take 4 mg by mouth at bedtime as needed for muscle spasms.   07/14/2021   traZODone (DESYREL) 50 MG tablet Take 1 tablet (50 mg total) by mouth at bedtime. 30 tablet 11 07/14/2021   Vitamin D, Ergocalciferol, (DRISDOL) 1.25 MG (50000 UT) CAPS capsule Take 50,000 Units by mouth every Monday.   Past Week   Accu-Chek FastClix Lancets MISC 1 Device by Other route 2 (two) times daily.       ACCU-CHEK GUIDE test strip 1 each by Other route 2 (two) times daily.      Insulin Pen Needle 31G X 5 MM MISC Use 1 needle daily to inject insulin as prescribed (Patient taking differently: 1 each. Use 1 needle daily to inject insulin as prescribed) 100 each 2    Scheduled:   carvedilol  12.5 mg Oral BID   [START ON 07/16/2021] furosemide  40 mg Oral TID WC   hydrocerin   Topical BID   [START ON 07/16/2021] insulin aspart  0-15 Units Subcutaneous TID WC   insulin aspart  0-5 Units Subcutaneous QHS   insulin aspart  4 Units Subcutaneous TID WC   insulin glargine-yfgn  15 Units Subcutaneous BID   ipratropium-albuterol  3 mL Nebulization BID   [START ON 07/16/2021] isosorbide mononitrate  30 mg Oral Daily   [START ON 07/16/2021] pantoprazole (PROTONIX) IV  40 mg Intravenous Q12H   [START ON 07/16/2021] polyethylene glycol-electrolytes  4,000 mL Oral Once   [START ON 07/16/2021] potassium chloride  20 mEq Oral Daily   sodium chloride flush  3 mL Intravenous Q12H   Infusions:   sodium chloride      Assessment: 42 yoM with PMH OSA/OHS on 4L O2 at baseline, dCHF, on Eliquis for recent PE diagnosed 9/3, subsequently admitted 9/7-9/10 for GI bleeding requiring Eliquis held through 9/11, now presents with recurrent rectal bleeding x 2 days. Will need colonoscopy.  Eliquis held and Pharmacy consulted to dose IV heparin. NO BOLUS given active GI bleeding.  Baseline INR, aPTT: not done, heparin level presumed to be elevated in setting of recent dose and GIB Prior anticoagulation: Eliquis 5 mg PO bid; LD 9/22 at 0900  Significant events:  Today, 07/15/2021: CBC: Hgb slightly low but stable from last admission; Plt stable WNL SCr WNL; stable from last admission  Goal of Therapy: Heparin level 0.3-0.7 units/ml Monitor platelets by anticoagulation protocol: Yes  Plan: Heparin 2500 units/hr IV infusion starting at 2100 tonight, no bolus Check aPTT 8 hrs after start Given body habitus, expect prolonged distribution phase; patient was therapeutic on 2500 units/hr during previous admission with similar renal function.  Daily CBC and heparin level; aPTT as needed while any DOAC anti-Xa effects persist Monitor for signs of worsening bleeding or thrombosis F/u plans for long-term anticoag (HemOnc recommending alternative agent such as LMWH or warfarin at discharge)  Reuel Boom, PharmD, BCPS [phone] 07/15/2021, 8:35 PM

## 2021-07-15 NOTE — ED Provider Notes (Signed)
[facility] DEPT Provider Note   CSN: [id number] Arrival date & time: 07/15/21  1045     History Chief Complaint  Patient presents with   Epistaxis    [name] is a 60 y.o. male.  HPI  60 year old male history of DVT, coronary artery disease, CHF, PE, on anticoagulation presents today complaining of rectal bleeding.  Patient was seen and admitted for rectal bleeding 9 7-9 10.  Review of records reveal that patient had resolution of his bleeding on the hospital with sedation of anticoagulation.  He had his anticoagulation restarted the day after discharge.  He has began having rectal bleeding again.  He reports up to 5 bloody stools per day for the past 2 days.  He denies chest pain, dyspnea, vomiting, abdominal pain. Review of records reveal recent admission for acute hypoxic respiratory failure with recurrent pulmonary embolism.  During the last hospitalization he was also positive for COVID.  The symptoms have resumed and is no longer require quarantine.  Past Medical History:  Diagnosis Date   Anemia    Arthritis    Back pain    CAD (coronary artery disease)    a. s/p DES to LAD in 05/2016   Cervical radiculopathy    Chronic diastolic CHF (congestive heart failure) (HCC)    Chronic pain    Depression    DVT (deep venous thrombosis) (HCC)    Hematemesis    Hepatic steatosis    Hyperlipidemia    Hypertension    IBS (irritable bowel syndrome)    Morbid obesity (HCC)    OSA (obstructive sleep apnea)    Pancreatitis    PE (pulmonary thromboembolism) ([place])    Peripheral neuropathy    PUD (peptic ulcer disease)    Renal disorder    Stroke [facility])    a. ?details unclear - not seen on imaging when he was admitted in 05/2017 for TIA symptoms which were felt due to cervical radiculopathy.   Thoracic aortic ectasia (HCC)    a. 4.3cm ectatic ascending thoracic aorta by CT 06/2017.    Type 2 diabetes mellitus ([place])     Patient Active Problem  List   Diagnosis Date Noted   Acute pulmonary embolism ([place]) 06/26/2021   Diabetic ulcer of lower leg ([place]) 06/26/2021   Acute respiratory failure with hypoxia ([place]) 06/26/2021   COVID-19 virus infection 06/26/2021   Lumbar radiculitis 03/30/2021   Morbid obesity ([place]) 01/15/2021   Personal history of colonic polyps 01/15/2021   Rectal bleeding 01/15/2021   Pain due to onychomycosis of toenails of both feet 01/15/2021   Epistaxis, recurrent 10/21/2020   Laceration of nose 10/21/2020   Syncope 10/15/2020   OSA (obstructive sleep apnea)    Bilateral carotid artery stenosis 07/28/2020   Bilateral lower extremity edema 07/28/2020   CHF (congestive heart failure) ([place]) 07/28/2020   DDD (degenerative disc disease), lumbar 07/28/2020   Diabetic peripheral neuropathy ([place]) 07/28/2020   Fatty liver 07/28/2020   Irritable bowel syndrome with diarrhea 07/28/2020   Osteoarthritis of right hip 07/28/2020   Upper GI bleed 04/28/2020   Hypertensive urgency 04/27/2020   Acute GI bleeding 04/27/2020   Mixed diabetic hyperlipidemia associated with type 2 diabetes mellitus ([place]) 04/27/2020   Rotator cuff arthropathy 10/30/2019   Cervical radiculopathy 09/24/2019   Trochanteric bursitis of right hip 09/24/2019   Chronic diastolic CHF (congestive heart failure) ([place]) 08/23/2019   ARF (acute renal failure) ([place]) 08/01/2019   Hyperkalemia 08/01/2019   Uncontrolled type 2  diabetes mellitus with hyperglycemia ([place]) 08/01/2019   AKI (acute kidney injury) ([place])    Hypotension 07/31/2019   DKA (diabetic ketoacidosis) ([place]) 05/05/2019   Blurred vision 05/05/2019   Trochanteric bursitis 02/11/2019   Long-term insulin use ([place]) 01/25/2019   Alteration consciousness 11/19/2018   Neck pain 10/09/2018   Left arm weakness 10/09/2018   B12 deficiency 08/10/2017   Persistent headaches 08/09/2017   GERD (gastroesophageal reflux disease) 06/29/2017   Left arm numbness    Cerebral embolism with cerebral  infarction 06/17/2017   TIA (transient ischemic attack) 06/17/2017   Chronic back pain 12/29/2016   Depression 12/15/2016   Vitamin D deficiency 12/09/2016   Right hip pain 12/07/2016   Hypokalemia 12/07/2016   Type 2 diabetes mellitus with vascular disease ([place]) 05/31/2016   Normocytic normochromic anemia 05/31/2016   Chest pain 05/31/2016   Coronary artery disease involving native coronary artery of native heart without angina pectoris 01/06/2016   Lactic acidosis 05/28/2014   Nonspecific chest pain 01/29/2014   Uncontrolled secondary diabetes with peripheral neuropathy ([place]) 01/29/2014   Obesity, Class III, BMI 40-49.9 (morbid obesity) ([place]) 01/29/2014   Snoring 01/29/2014   Dyslipidemia 01/29/2014   HTN (hypertension) 01/29/2014   Abnormal nuclear stress test 01/29/2014    Past Surgical History:  Procedure Laterality Date   CARDIAC CATHETERIZATION N/A 05/31/2016   Procedure: Left Heart Cath and Coronary Angiography;  Surgeon: Peter M Martinique, MD;  Location: [facility];  Service: Cardiovascular;  Laterality: N/A;   CARDIAC CATHETERIZATION N/A 05/31/2016   Procedure: Intravascular Pressure Wire/FFR Study;  Surgeon: Peter M Martinique, MD;  Location: [facility];  Service: Cardiovascular;  Laterality: N/A;   CARDIAC CATHETERIZATION N/A 05/31/2016   Procedure: Coronary Stent Intervention;  Surgeon: Peter M Martinique, MD;  Location: [facility];  Service: Cardiovascular;  Laterality: N/A;   COLONOSCOPY WITH PROPOFOL N/A 04/29/2020   Procedure: COLONOSCOPY WITH PROPOFOL;  Surgeon: Wilford Corner, MD;  Location: [facility];  Service: Endoscopy;  Laterality: N/A;   ESOPHAGOGASTRODUODENOSCOPY N/A 04/29/2020   Procedure: ESOPHAGOGASTRODUODENOSCOPY (EGD);  Surgeon: Wilford Corner, MD;  Location: [facility];  Service: Endoscopy;  Laterality: N/A;   LEFT HEART CATH AND CORONARY ANGIOGRAPHY N/A 12/08/2017   Procedure: LEFT HEART CATH AND CORONARY ANGIOGRAPHY;  Surgeon: Leonie Man, MD;  Location: [facility];  Service: Cardiovascular;  Laterality: N/A;   LEFT HEART CATHETERIZATION WITH CORONARY ANGIOGRAM N/A 02/03/2014   Procedure: LEFT HEART CATHETERIZATION WITH CORONARY ANGIOGRAM;  Surgeon: Pixie Casino, MD;  Location: [facility] CATH LAB;  Service: Cardiovascular;  Laterality: N/A;   left leg stent      POLYPECTOMY  04/29/2020   Procedure: POLYPECTOMY;  Surgeon: Wilford Corner, MD;  Location: [facility] ENDOSCOPY;  Service: Endoscopy;;       Family History  Problem Relation Age of Onset   Cancer Father    Hypertension Mother    Diabetes Mother    Breast cancer Mother    Hypertension Brother    Diabetes Brother    Hypertension Sister    Diabetes Sister     Social History   Tobacco Use   Smoking status: Never   Smokeless tobacco: Never  Vaping Use   Vaping Use: Never used  Substance Use Topics   Alcohol use: No   Drug use: No    Home Medications Prior to Admission medications   Medication Sig Start Date End Date Taking? Authorizing Provider  Accu-Chek FastClix Lancets MISC 1 Device by Other route  2 (two) times daily.  03/20/19   [provider]  ACCU-CHEK GUIDE test strip 1 each by Other route 2 (two) times daily. 01/08/21   [provider]  albuterol (VENTOLIN HFA) 108 (90 Base) MCG/ACT inhaler Inhale 2 puffs into the lungs every 6 (six) hours as needed for wheezing or shortness of breath. As Directed    [provider]  apixaban (ELIQUIS) 5 MG TABS tablet Take 2 tablets (10mg ) twice daily for 7 days, then 1 tablet (5mg ) twice daily 07/04/21   Little Ishikawa, MD  atorvastatin (LIPITOR) 40 MG tablet Take 40 mg by mouth at bedtime.    [provider]  carvedilol (COREG) 12.5 MG tablet Take 12.5 mg by mouth 2 (two) times daily. 10/22/20   [provider]  famotidine (PEPCID) 20 MG tablet Take 20 mg by mouth daily. 03/05/20   [provider]  fenofibrate (TRICOR) 145 MG tablet Take 1 tablet  (145 mg total) by mouth daily. 06/25/21   Deberah Pelton, NP  furosemide (LASIX) 40 MG tablet Take 1 tablet (40 mg total) by mouth daily. PLEASE RESUME ONLY FROM 10/21/2020. DOSE HAS BEEN CHANGED. Patient taking differently: Take 40 mg by mouth 3 (three) times daily. 10/21/20 10/21/21  Bonnielee Haff, MD  glipiZIDE (GLUCOTROL) 10 MG tablet Take 10 mg by mouth 2 (two) times daily. 05/30/17   [provider]  HUMULIN R U-500 KWIKPEN 500 UNIT/ML kwikpen Inject 80 Units into the skin See admin instructions. Inject under the skin three times daily as directed or sliding scale: 06/04/20   [provider]  Insulin Pen Needle 31G X 5 MM MISC Use 1 needle daily to inject insulin as prescribed Patient taking differently: 1 each. Use 1 needle daily to inject insulin as prescribed 06/18/17   Barton Dubois, MD  ipratropium-albuterol (DUONEB) 0.5-2.5 (3) MG/3ML SOLN Take 3 mLs by nebulization 2 (two) times daily. 06/24/21   [provider]  isosorbide mononitrate (IMDUR) 30 MG 24 hr tablet TAKE 1 TABLET BY MOUTH DAILY Patient taking differently: Take 30 mg by mouth daily. 12/24/20   Hilty, Nadean Corwin, MD  lisinopril (ZESTRIL) 5 MG tablet Take 5 mg by mouth daily. 12/04/20   [provider]  nitroGLYCERIN (NITROSTAT) 0.4 MG SL tablet Place 1 tablet (0.4 mg total) under the tongue every 5 (five) minutes as needed for chest pain. 02/06/19   Skeet Latch, MD  ondansetron (ZOFRAN) 4 MG tablet Take 4 mg by mouth 2 (two) times daily as needed for nausea/vomiting. 04/21/20   [provider]  pantoprazole (PROTONIX) 40 MG tablet Take 40 mg by mouth daily. 04/13/21   [provider]  potassium chloride (MICRO-K) 10 MEQ CR capsule Take 20 mEq by mouth daily. 04/03/20   [provider]  sertraline (ZOLOFT) 50 MG tablet Take 50 mg by mouth daily. 12/17/20   [provider]  tiZANidine (ZANAFLEX) 4 MG tablet Take 4 mg by mouth at bedtime as needed for muscle  spasms. 03/31/20   [provider]  traZODone (DESYREL) 50 MG tablet Take 1 tablet (50 mg total) by mouth at bedtime. 12/15/16   Florinda Marker, MD  Vitamin D, Ergocalciferol, (DRISDOL) 1.25 MG (50000 UT) CAPS capsule Take 50,000 Units by mouth every Monday. 08/15/19   [provider]    Allergies    Coconut flavor [flavoring agent], Coconut oil, Ibuprofen, Aleve [naproxen], Nsaids, and Vascepa [icosapent ethyl]  Review of Systems   Review of Systems  HENT:  Positive for nosebleeds.   Eyes: Negative.   Respiratory: Negative.    Cardiovascular: Negative.   Gastrointestinal:  Positive for anal bleeding and blood in stool.  Genitourinary: Negative.   Musculoskeletal: Negative.   Skin: Negative.   Allergic/Immunologic: Negative.   Neurological: Negative.   Hematological: Negative.   Psychiatric/Behavioral: Negative.    All other systems reviewed and are negative.  Physical Exam Updated Vital Signs BP 134/69   Pulse 76   Resp (!) 9   SpO2 92%   Physical Exam Vitals and nursing note reviewed.  Constitutional:      Appearance: Normal appearance. He is obese.  HENT:     Head: Normocephalic.     Right Ear: External ear normal.     Left Ear: External ear normal.     Nose: Nose normal.     Mouth/Throat:     Pharynx: Oropharynx is clear.  Eyes:     Extraocular Movements: Extraocular movements intact.     Pupils: Pupils are equal, round, and reactive to light.  Cardiovascular:     Rate and Rhythm: Normal rate and regular rhythm.     Pulses: Normal pulses.  Pulmonary:     Effort: Pulmonary effort is normal.     Breath sounds: Normal breath sounds.  Abdominal:     General: Abdomen is flat. Bowel sounds are normal. There is no distension.     Palpations: Abdomen is soft.     Tenderness: There is no abdominal tenderness.  Genitourinary:    Comments: Rectal exam performed with gross blood noted Musculoskeletal:        General: No swelling. Normal range of  motion.     Cervical back: Normal range of motion.     Right lower leg: Edema present.     Left lower leg: Edema present.     Comments: Patient with chronic bilateral lower extremity with chronic skin changes no acute wounds noted  Skin:    General: Skin is warm.     Capillary Refill: Capillary refill takes less than 2 seconds.  Neurological:     General: No focal deficit present.     Mental Status: He is alert.  Psychiatric:        Mood and Affect: Mood normal.    ED Results / Procedures / Treatments   Labs (all labs ordered are listed, but only abnormal results are displayed) Labs Reviewed  CBC - Abnormal; Notable for the following components:      Result Value   RBC 3.64 (*)    Hemoglobin 11.5 (*)    HCT 34.8 (*)    All other components within normal limits  COMPREHENSIVE METABOLIC PANEL - Abnormal; Notable for the following components:   Potassium 3.4 (*)    Glucose, Bld 228 (*)    All other components within normal limits  TYPE AND SCREEN    EKG EKG Interpretation  Date/Time:  Thursday July 15 2021 12:58:36 EDT Ventricular Rate:  76 PR Interval:  177 QRS Duration: 82 QT Interval:  458 QTC Calculation: 515 R Axis:   -21 Text Interpretation: Sinus rhythm Probable inferior infarct, age indeterminate Abnrm T, consider ischemia, anterolateral lds Prolonged QT interval Confirmed by Pattricia Boss [phone]) on 07/15/2021 1:35:39 PM  Radiology No results found.  Procedures Procedures   Medications Ordered in ED Medications - No data to display  ED Course  I have reviewed the triage vital signs and the nursing notes.  Pertinent labs & imaging results that were available during  my care of the patient were reviewed by me and considered in my medical decision making (see chart for details).    MDM Rules/Calculators/A&P                           60 year old man with anticoagulation secondary to PE presents today with ongoing rectal bleeding.  Recent  hospitalization with plans for colonoscopy scheduled December 6.  He had his anticoagulation restarted on discharge and has now having more rectal bleeding.  He remains hemodynamically stable.  Hemoglobin is 11.5 which is stable from first prior 1- rectal bleeding-discussed with Dr. Michail Sermon.  He advises patient will need studies inpatient.  Advises admission to hospitalist. 2-abnormal ekg  Discussed above with Dr. Tana Coast who will see for admission.  Final Clinical Impression(s) / ED Diagnoses Final diagnoses:  Rectal bleeding  Abnormal EKG    Rx / DC Orders ED Discharge Orders     None        Pattricia Boss, MD 07/15/21 1400

## 2021-07-15 NOTE — H&P (Addendum)
History and Physical        Hospital Admission Note Date: 07/15/2021  Patient name: [name] Medical record number: [id number] Date of birth: 07-24-1961 Age: 60 y.o. Gender: male  PCP: [name], FNP    Patient coming from: home   I have reviewed all records in the [facility].    Chief Complaint:  Rectal bleeding for last 2 days  HPI: Patient is a 60 year old male with history of type 2 diabetes mellitus, hypertension, OSA not on CPAP, chronic respiratory failure on 4 L O2 via [place] during ambulation and at night, chronic diastolic CHF, morbid obesity, recommend recent acute PE, most recently discharged on 9/10.  Patient was also previously hospitalized 9/3-9/6 with progressive shortness of breath, acute hypoxia with recurrent acute PE and had recent COVID.  Patient was started on Eliquis for acute PE.  During admission 9/7-9/10, patient presented with GI bleeding in the setting of newly prescribed anticoagulation.  Patient was followed by GI, presumed to be diverticular bleeding in the setting of anticoagulation, no invasive work-up and bleeding resolved.  Patient resumed Eliquis 2 days after the discharge per instructions. Per patient, he had intermittent epistaxis after he resumed eliquis, currently no ongoing epistaxis.  In the last 2 days he started having multiple episodes of rectal bleeding.  He also had bloating, burping and flatulence.  No NSAID use.  He had 3-5 episodes of rectal bleeding daily for the last 2 days.  No nausea vomiting or hematemesis.   ED work-up/course:  In ED respiratory rate 19, pulse 79, BP 135/68, O2 sats 92% on room air Hemoglobin 11.5, hematocrit 34.8, platelets 246K CMET unremarkable EKG showed rate 76, TWI 2 3 aVF, new T wave changes in the lateral leads, prolonged QTC 515  Review of Systems: Positives marked in  'bold' Constitutional: Denies fever, chills, diaphoresis, poor appetite and fatigue.  HEENT: Denies photophobia, eye pain, redness, hearing loss, ear pain, congestion, sore throat, rhinorrhea, sneezing, mouth sores, trouble swallowing, neck pain, neck stiffness and tinnitus.   Respiratory: Denies SOB, DOE, cough, chest tightness,  and wheezing.   Cardiovascular: Denies chest pain, palpitations and leg swelling.  Gastrointestinal: See HPI  Genitourinary: Denies dysuria, urgency, frequency, hematuria, flank pain and difficulty urinating.  Musculoskeletal: Denies myalgias, back pain, joint swelling, arthralgias and gait problem.  Skin: Denies pallor, rash and wound.  Neurological: Denies dizziness, seizures, syncope, weakness, light-headedness, numbness and headaches.  Hematological: Denies easy bruising, personal or family bleeding history  Psychiatric/Behavioral: Denies suicidal ideation, mood changes, confusion, nervousness, sleep disturbance and agitation  Past Medical History: Past Medical History:  Diagnosis Date   Anemia    Arthritis    Back pain    CAD (coronary artery disease)    a. s/p DES to LAD in 05/2016   Cervical radiculopathy    Chronic diastolic CHF (congestive heart failure) (HCC)    Chronic pain    Depression    DVT (deep venous thrombosis) (HCC)    Hematemesis    Hepatic steatosis    Hyperlipidemia    Hypertension    IBS (irritable bowel syndrome)    Morbid obesity (HCC)    OSA (obstructive  sleep apnea)    Pancreatitis    PE (pulmonary thromboembolism) ([place])    Peripheral neuropathy    PUD (peptic ulcer disease)    Renal disorder    Stroke [facility])    a. ?details unclear - not seen on imaging when he was admitted in 05/2017 for TIA symptoms which were felt due to cervical radiculopathy.   Thoracic aortic ectasia (HCC)    a. 4.3cm ectatic ascending thoracic aorta by CT 06/2017.    Type 2 diabetes mellitus ([place])     Past Surgical History: Past Surgical History:   Procedure Laterality Date   CARDIAC CATHETERIZATION N/A 05/31/2016   Procedure: Left Heart Cath and Coronary Angiography;  Surgeon: Peter M Martinique, MD;  Location: [place] CV LAB;  Service: Cardiovascular;  Laterality: N/A;   CARDIAC CATHETERIZATION N/A 05/31/2016   Procedure: Intravascular Pressure Wire/FFR Study;  Surgeon: Peter M Martinique, MD;  Location: [place] CV LAB;  Service: Cardiovascular;  Laterality: N/A;   CARDIAC CATHETERIZATION N/A 05/31/2016   Procedure: Coronary Stent Intervention;  Surgeon: Peter M Martinique, MD;  Location: [place] CV LAB;  Service: Cardiovascular;  Laterality: N/A;   COLONOSCOPY WITH PROPOFOL N/A 04/29/2020   Procedure: COLONOSCOPY WITH PROPOFOL;  Surgeon: Wilford Corner, MD;  Location: [place];  Service: Endoscopy;  Laterality: N/A;   ESOPHAGOGASTRODUODENOSCOPY N/A 04/29/2020   Procedure: ESOPHAGOGASTRODUODENOSCOPY (EGD);  Surgeon: Wilford Corner, MD;  Location: [place];  Service: Endoscopy;  Laterality: N/A;   LEFT HEART CATH AND CORONARY ANGIOGRAPHY N/A 12/08/2017   Procedure: LEFT HEART CATH AND CORONARY ANGIOGRAPHY;  Surgeon: Leonie Man, MD;  Location: [place] CV LAB;  Service: Cardiovascular;  Laterality: N/A;   LEFT HEART CATHETERIZATION WITH CORONARY ANGIOGRAM N/A 02/03/2014   Procedure: LEFT HEART CATHETERIZATION WITH CORONARY ANGIOGRAM;  Surgeon: Pixie Casino, MD;  Location: [facility] CATH LAB;  Service: Cardiovascular;  Laterality: N/A;   left leg stent      POLYPECTOMY  04/29/2020   Procedure: POLYPECTOMY;  Surgeon: Wilford Corner, MD;  Location: [facility] ENDOSCOPY;  Service: Endoscopy;;    Medications: Prior to Admission medications   Medication Sig Start Date End Date Taking? Authorizing Provider  Accu-Chek FastClix Lancets MISC 1 Device by Other route 2 (two) times daily.  03/20/19   [provider]  ACCU-CHEK GUIDE test strip 1 each by Other route 2 (two) times daily. 01/08/21   [provider]  albuterol  (VENTOLIN HFA) 108 (90 Base) MCG/ACT inhaler Inhale 2 puffs into the lungs every 6 (six) hours as needed for wheezing or shortness of breath. As Directed    [provider]  apixaban (ELIQUIS) 5 MG TABS tablet Take 2 tablets (10mg ) twice daily for 7 days, then 1 tablet (5mg ) twice daily 07/04/21   Little Ishikawa, MD  atorvastatin (LIPITOR) 40 MG tablet Take 40 mg by mouth at bedtime.    [provider]  carvedilol (COREG) 12.5 MG tablet Take 12.5 mg by mouth 2 (two) times daily. 10/22/20   [provider]  famotidine (PEPCID) 20 MG tablet Take 20 mg by mouth daily. 03/05/20   [provider]  fenofibrate (TRICOR) 145 MG tablet Take 1 tablet (145 mg total) by mouth daily. 06/25/21   Deberah Pelton, NP  furosemide (LASIX) 40 MG tablet Take 1 tablet (40 mg total) by mouth daily. PLEASE RESUME ONLY FROM 10/21/2020. DOSE HAS BEEN CHANGED. Patient taking differently: Take 40 mg by mouth 3 (three) times daily. 10/21/20 10/21/21  Bonnielee Haff, MD  glipiZIDE (GLUCOTROL) 10 MG tablet Take 10 mg by mouth 2 (two) times daily. 05/30/17   [provider]  HUMULIN R U-500 KWIKPEN 500 UNIT/ML kwikpen Inject 80 Units into the skin See admin instructions. Inject under the skin three times daily as directed or sliding scale: 06/04/20   [provider]  Insulin Pen Needle 31G X 5 MM MISC Use 1 needle daily to inject insulin as prescribed Patient taking differently: 1 each. Use 1 needle daily to inject insulin as prescribed 06/18/17   Barton Dubois, MD  ipratropium-albuterol (DUONEB) 0.5-2.5 (3) MG/3ML SOLN Take 3 mLs by nebulization 2 (two) times daily. 06/24/21   [provider]  isosorbide mononitrate (IMDUR) 30 MG 24 hr tablet TAKE 1 TABLET BY MOUTH DAILY Patient taking differently: Take 30 mg by mouth daily. 12/24/20   Hilty, Nadean Corwin, MD  lisinopril (ZESTRIL) 5 MG tablet Take 5 mg by mouth daily. 12/04/20   [provider]  nitroGLYCERIN  (NITROSTAT) 0.4 MG SL tablet Place 1 tablet (0.4 mg total) under the tongue every 5 (five) minutes as needed for chest pain. 02/06/19   Skeet Latch, MD  ondansetron (ZOFRAN) 4 MG tablet Take 4 mg by mouth 2 (two) times daily as needed for nausea/vomiting. 04/21/20   [provider]  pantoprazole (PROTONIX) 40 MG tablet Take 40 mg by mouth daily. 04/13/21   [provider]  potassium chloride (MICRO-K) 10 MEQ CR capsule Take 20 mEq by mouth daily. 04/03/20   [provider]  sertraline (ZOLOFT) 50 MG tablet Take 50 mg by mouth daily. 12/17/20   [provider]  tiZANidine (ZANAFLEX) 4 MG tablet Take 4 mg by mouth at bedtime as needed for muscle spasms. 03/31/20   [provider]  traZODone (DESYREL) 50 MG tablet Take 1 tablet (50 mg total) by mouth at bedtime. 12/15/16   Florinda Marker, MD  Vitamin D, Ergocalciferol, (DRISDOL) 1.25 MG (50000 UT) CAPS capsule Take 50,000 Units by mouth every Monday. 08/15/19   [provider]    Allergies:   Allergies  Allergen Reactions   Coconut Flavor [Flavoring Agent] Hives   Coconut Oil Hives   Ibuprofen Other (See Comments)    Made gastric ulcers worse   Aleve [Naproxen] Other (See Comments)    DUE TO KIDNEYS   Nsaids Other (See Comments)    Stomach ulcers    Vascepa [Icosapent Ethyl] Other (See Comments)    headaches, chest pain, similar to sx of a stroke, hypotension     Social History:  reports that he has never smoked. He has never used smokeless tobacco. He reports that he does not drink alcohol and does not use drugs.  Family History: Family History  Problem Relation Age of Onset   Cancer Father    Hypertension Mother    Diabetes Mother    Breast cancer Mother    Hypertension Brother    Diabetes Brother    Hypertension Sister    Diabetes Sister     Physical Exam: Blood pressure 127/64, pulse 74, resp. rate 10, SpO2 91 %.  On room air General: Alert, awake, oriented x3, in no  acute distress. Eyes: pink conjunctiva,anicteric sclera, PERLA HEENT: normocephalic, atraumatic, oropharynx clear Neck: supple, no masses or lymphadenopathy, no JVD CVS: Regular rate and rhythm,  1+ lower extremity edema Resp : Clear to auscultation bilaterally, no wheezing, rales or rhonchi. GI : Obese, soft, nontender, nondistended, positive bowel sounds. Musculoskeletal: No clubbing or cyanosis.  Chronic bilateral lower  extremity venous stasis and discoloration.  ROM intact  Neuro: Grossly intact, no focal neurological deficits, strength 5/5 upper and lower extremities bilaterally Psych: alert and oriented x 3, normal mood and affect Skin: no rashes or lesions, warm and dry   LABS on Admission: I have personally reviewed all the labs and imagings below    Basic Metabolic Panel: Recent Labs  Lab 07/15/21 1236  NA 141  K 3.4*  CL 102  CO2 28  GLUCOSE 228*  BUN 15  CREATININE 0.77  CALCIUM 9.4   Liver Function Tests: Recent Labs  Lab 07/15/21 1236  AST 25  ALT 26  ALKPHOS 45  BILITOT 0.8  PROT 7.3  ALBUMIN 3.7   No results for input(s): LIPASE, AMYLASE in the last 168 hours. No results for input(s): AMMONIA in the last 168 hours. CBC: Recent Labs  Lab 07/15/21 1236  WBC 8.5  HGB 11.5*  HCT 34.8*  MCV 95.6  PLT 246   Cardiac Enzymes: No results for input(s): CKTOTAL, CKMB, CKMBINDEX, TROPONINI in the last 168 hours. BNP: Invalid input(s): POCBNP CBG: No results for input(s): GLUCAP in the last 168 hours.  Radiological Exams on Admission:  DG Chest Port 1 View  Result Date: 07/15/2021 CLINICAL DATA:  Intermittent nosebleeds. EXAM: PORTABLE CHEST 1 VIEW COMPARISON:  06/30/2021 FINDINGS: The cardiac silhouette, mediastinal and hilar contours are within normal limits and stable. The lungs are clear. No pleural effusions. The bony thorax is intact. IMPRESSION: No acute cardiopulmonary findings. Electronically Signed   By: Marijo Sanes M.D.   On: 07/15/2021 13:41       EKG: Independently reviewed. EKG showed rate 76, TWI 2 3 aVF, new T wave changes in the lateral leads, prolonged QTC 515   Assessment/Plan Principal Problem:   Acute GI bleeding, recurrent, in the setting of anticoagulation -Eliquis placed on hold, n.p.o. status -GI has been consulted, will follow recommendations, will likely need EGD, colonoscopy for further work-up -Given history of GERD, symptoms of burping, flatulence, will place on IV Protonix 40 mg every 12 hours   Active Problems: Recent acute recurrent pulmonary embolism, prior history of left DVT/PE -Patient is on anticoagulation however has recurrent GI bleed at this time and intermittent epistaxis, was recently started on Eliquis  -Hold Eliquis for now -Discussed with hematology, Dr. Benay Spice in detail, recommended IV heparin drip while he is inpatient and discharge on Lovenox (preferred if patient is able to do it)  or Coumadin for 3 to 6 months.  No IVC filter unless patient also bleeds on Coumadin or Lovenox.  Will need close outpatient follow-up with PCP. Addendum D/w Dr. Michail Sermon, plan for colonoscopy this admission but will need 2-3 days prep.  -will continue to hold Eliquis however start on IV heparin drip without bolus due to recent PE with moderate clot burden.    Dyslipidemia -Currently n.p.o., will resume statin once on p.o. diet    Coronary artery disease involving native coronary artery of native heart without angina pectoris, chronic diastolic CHF -will continue home dose of Lasix 40 mg 3 times daily, continue Imdur -Recent 2D echo on 06/28/2021 had shown EF of 60-65%, no regional WMA -Follow I's and O's, currently compensated  Abnormal EKG with T wave inversion in lateral leads -No chest pain or acute shortness of breath. -Will obtain serial troponins, recent echo on 9/5 had shown normal EF with no regional WMA     Uncontrolled type 2 diabetes mellitus with hyperglycemia ([place]), IDDM -Hemoglobin A1c  10.4  on 9/4 - Placed on sliding scale insulin, patient is on Humulin 80 units 3 times daily AC, currently on n.p.o. status and still placed on 40 units tid AC - diabetic coordinator consult      OSA (obstructive sleep apnea), chronic respiratory failure, COPD -Per patient he is not on CPAP, uses 4 L O2 via nasal cannula on ambulation and at night -Continue DuoNebs, albuterol as needed    Epistaxis, recurrent -In the setting of eliquis, currently no ongoing epistaxis    Morbid obesity ([place]) - will counsel on lifestyle changes and diet  Hypokalemia, prolonged QTC -Replaced  -Avoid QT prolonging meds   DVT prophylaxis: SCDs  CODE STATUS: Full CODE STATUS, discussed with the patient  Consults called: Hematology, gastroenterology  Family Communication: Admission, patients condition and plan of care including tests being ordered have been discussed with the patient who indicates understanding and agree with the plan and Code Status  Admission status:   The medical decision making on this patient was of high complexity and the patient is at high risk for clinical deterioration, therefore this is a level 3 admission.  Severity of Illness:      The appropriate patient status for this patient is INPATIENT. Inpatient status is judged to be reasonable and necessary in order to provide the required intensity of service to ensure the patient's safety. The patient's presenting symptoms, physical exam findings, and initial radiographic and laboratory data in the context of their chronic comorbidities is felt to place them at high risk for further clinical deterioration. Furthermore, it is not anticipated that the patient will be medically stable for discharge from the hospital within 2 midnights of admission. The following factors support the patient status of inpatient.   " The patient's presenting symptoms include rectal bleeding in the setting of NOAC anticoagulation " The worrisome physical  exam findings include rectal bleeding " The initial radiographic and laboratory data are worrisome because of new EKG changes, H&H currently stable " The chronic co-morbidities include recurrent PE, DVT, chronic CHF, recurrent GI bleed   * I certify that at the point of admission it is my clinical judgment that the patient will require inpatient hospital care spanning beyond 2 midnights from the point of admission due to high intensity of service, high risk for further deterioration and high frequency of surveillance required.*   Time Spent on Admission: 70 minutes     Kyarra Vancamp M.D. [facility] 07/15/2021, 2:31 PM

## 2021-07-16 DIAGNOSIS — G4733 Obstructive sleep apnea (adult) (pediatric): Secondary | ICD-10-CM

## 2021-07-16 DIAGNOSIS — I2510 Atherosclerotic heart disease of native coronary artery without angina pectoris: Secondary | ICD-10-CM | POA: Diagnosis not present

## 2021-07-16 DIAGNOSIS — I2699 Other pulmonary embolism without acute cor pulmonale: Secondary | ICD-10-CM

## 2021-07-16 DIAGNOSIS — E1165 Type 2 diabetes mellitus with hyperglycemia: Secondary | ICD-10-CM

## 2021-07-16 DIAGNOSIS — R040 Epistaxis: Secondary | ICD-10-CM

## 2021-07-16 DIAGNOSIS — E785 Hyperlipidemia, unspecified: Secondary | ICD-10-CM | POA: Diagnosis not present

## 2021-07-16 DIAGNOSIS — I5032 Chronic diastolic (congestive) heart failure: Secondary | ICD-10-CM | POA: Diagnosis not present

## 2021-07-16 DIAGNOSIS — K922 Gastrointestinal hemorrhage, unspecified: Secondary | ICD-10-CM | POA: Diagnosis not present

## 2021-07-16 LAB — BASIC METABOLIC PANEL
Anion gap: 12 (ref 5–15)
BUN: 11 mg/dL (ref 6–20)
CO2: 25 mmol/L (ref 22–32)
Calcium: 9 mg/dL (ref 8.9–10.3)
Chloride: 100 mmol/L (ref 98–111)
Creatinine, Ser: 0.68 mg/dL (ref 0.61–1.24)
GFR, Estimated: >60 mL/min (ref >60)
Glucose, Bld: 204 mg/dL — ABNORMAL HIGH (ref 70–99)
Potassium: 3.5 mmol/L (ref 3.5–5.1)
Sodium: 137 mmol/L (ref 135–145)

## 2021-07-16 LAB — CBC
HCT: 34.5 % — ABNORMAL LOW (ref 39.0–52.0)
Hemoglobin: 11.2 g/dL — ABNORMAL LOW (ref 13.0–17.0)
MCH: 30.8 pg (ref 26.0–34.0)
MCHC: 32.5 g/dL (ref 30.0–36.0)
MCV: 94.8 fL (ref 80.0–100.0)
Platelets: 244 10*3/uL (ref 150–400)
RBC: 3.64 MIL/uL — ABNORMAL LOW (ref 4.22–5.81)
RDW: 13.8 % (ref 11.5–15.5)
WBC: 9.3 10*3/uL (ref 4.0–10.5)
nRBC: 0 % (ref 0.0–0.2)

## 2021-07-16 LAB — GLUCOSE, CAPILLARY
Glucose-Capillary: 206 mg/dL — ABNORMAL HIGH (ref 70–99)
Glucose-Capillary: 182 mg/dL — ABNORMAL HIGH (ref 70–99)
Glucose-Capillary: 193 mg/dL — ABNORMAL HIGH (ref 70–99)
Glucose-Capillary: 196 mg/dL — ABNORMAL HIGH (ref 70–99)
Glucose-Capillary: 194 mg/dL — ABNORMAL HIGH (ref 70–99)

## 2021-07-16 LAB — APTT
aPTT: 97 seconds — ABNORMAL HIGH (ref 24–36)
aPTT: 95 seconds — ABNORMAL HIGH (ref 24–36)

## 2021-07-16 LAB — HEMOGLOBIN A1C
Hgb A1c MFr Bld: 9.6 % — ABNORMAL HIGH (ref 4.8–5.6)
Mean Plasma Glucose: 229 mg/dL

## 2021-07-16 LAB — HEMOGLOBIN AND HEMATOCRIT, BLOOD
HCT: 34.4 % — ABNORMAL LOW (ref 39.0–52.0)
Hemoglobin: 11.3 g/dL — ABNORMAL LOW (ref 13.0–17.0)

## 2021-07-16 LAB — HEPARIN LEVEL (UNFRACTIONATED): Heparin Unfractionated: >1.1 IU/mL — ABNORMAL HIGH (ref 0.30–0.70)

## 2021-07-16 MED ORDER — INSULIN ASPART 100 UNIT/ML IJ SOLN
7.0000 [IU] | Freq: Three times a day (TID) | INTRAMUSCULAR | Status: DC
  Administered 2021-07-16 – 2021-07-18 (×4): 7 [IU] via SUBCUTANEOUS

## 2021-07-16 MED ORDER — POTASSIUM CHLORIDE CRYS ER 20 MEQ PO TBCR
40.0000 meq | EXTENDED_RELEASE_TABLET | Freq: Every day | ORAL | Status: DC
Start: 2021-07-16 — End: 2021-07-18
  Administered 2021-07-16 – 2021-07-18 (×3): 40 meq via ORAL
  Filled 2021-07-16 (×2): qty 2

## 2021-07-16 MED ORDER — INSULIN GLARGINE-YFGN 100 UNIT/ML ~~LOC~~ SOLN
20.0000 [IU] | Freq: Two times a day (BID) | SUBCUTANEOUS | Status: DC
  Administered 2021-07-16 – 2021-07-18 (×4): 20 [IU] via SUBCUTANEOUS
  Filled 2021-07-16 (×4): qty 0.2

## 2021-07-16 MED ORDER — HEPARIN (PORCINE) 25000 UT/250ML-% IV SOLN
2400.0000 [IU]/h | INTRAVENOUS | Status: DC
  Administered 2021-07-16: 2400 [IU]/h via INTRAVENOUS
  Filled 2021-07-16 (×2): qty 250

## 2021-07-16 MED ORDER — TRAMADOL HCL 50 MG PO TABS
50.0000 mg | ORAL_TABLET | Freq: Once | ORAL | Status: AC
  Administered 2021-07-16: 50 mg via ORAL
  Filled 2021-07-16: qty 1

## 2021-07-16 MED ORDER — DOXYLAMINE SUCCINATE (SLEEP) 25 MG PO TABS
25.0000 mg | ORAL_TABLET | Freq: Every evening | ORAL | Status: DC | PRN
  Administered 2021-07-16 – 2021-07-17 (×2): 25 mg via ORAL
  Filled 2021-07-16 (×4): qty 1

## 2021-07-16 MED ORDER — MAGNESIUM SULFATE 2 GM/50ML IV SOLN
2.0000 g | Freq: Once | INTRAVENOUS | Status: AC
  Administered 2021-07-16: 2 g via INTRAVENOUS
  Filled 2021-07-16: qty 50

## 2021-07-16 NOTE — H&P (View-Only) (Signed)
[facility]astroenterology Progress Note  [name] 60 y.o. [birth year]  CC:  GI bleed   Subjective: Patient states he had bloody, loose Bms last night. Reports vomiting his prep last night due to drinking it too fast. Is doing well drinking his prep this morning and is doing so slowly through a straw. Denies nausea or vomiting this morning. Denies abdominal pain.  ROS : Review of Systems  Cardiovascular:  Negative for chest pain and palpitations.  Gastrointestinal:  Positive for blood in stool and vomiting (last night due to prep - resolved this morning). Negative for abdominal pain, constipation, diarrhea, heartburn and nausea.     Objective: Vital signs in last 24 hours: Vitals:   07/16/21 0434 07/16/21 0813  BP: (!) 148/80   Pulse: 72   Resp: 18   Temp: 98 F (36.7 C)   SpO2: 100% 97%    Physical Exam:  General:  Alert, cooperative, no distress, morbidly obese sitting comfortably in chair  Head:  Normocephalic, without obvious abnormality, atraumatic  Eyes:  Anicteric sclera, EOM's intact  Lungs:   Clear to auscultation bilaterally, respirations unlabored  Heart:  Regular rate and rhythm, S1, S2 normal  Abdomen:   Soft, non-tender, bowel sounds active all four quadrants,  no masses, truncal obesity    Lab Results: Recent Labs    07/15/21 1236 07/16/21 0539  NA 141 137  K 3.4* 3.5  CL 102 100  CO2 28 25  GLUCOSE 228* 204*  BUN 15 11  CREATININE 0.77 0.68  CALCIUM 9.4 9.0   Recent Labs    07/15/21 1236  AST 25  ALT 26  ALKPHOS 45  BILITOT 0.8  PROT 7.3  ALBUMIN 3.7   Recent Labs    07/15/21 1236 07/16/21 0539  WBC 8.5 9.3  HGB 11.5* 11.2*  HCT 34.8* 34.5*  MCV 95.6 94.8  PLT 246 244   No results for input(s): LABPROT, INR in the last 72 hours.    Assessment GI Bleed, anemia - profuse hematochezia likely due to diverticular bleed, however, cannot r/o malignancy - HGB 11.2 (decreased from 11.5 yesterday) -  Normal BUN/Cr  11/0.68  Recent acute PE, prior history of left DVT/PE  Chronic diastolic CHF - Recent 2D echo on 06/28/2021 had shown EF of 60-65%  Diabetes type 2  OSA   Plan: Plan for Colonoscopy tomorrow. I thoroughly discussed the procedures to include nature, alternatives, benefits, and risks including but not limited to bleeding, perforation, infection, anesthesia/cardiac and pulmonary complications. Patient provides understanding and gave verbal consent to proceed.   Nulytely prep, clear liquid diet, NPO at midnight. Will proceed with colonoscopy if clear stools.   Continue daily CBC with transfusion as needed to maintain Hgb >7.   If destabilizing bleeding occurs, recommend CTA followed by IR consultation if positive.   [facility] will follow.      Garnette Scheuermann PA-C 07/16/2021, 10:53 AM  Contact #  [phone]

## 2021-07-16 NOTE — Progress Notes (Signed)
Inpatient Diabetes Program Recommendations  AACE/ADA: New Consensus Statement on Inpatient Glycemic Control (2015)  Target Ranges:  Prepandial:   less than 140 mg/dL      Peak postprandial:   less than 180 mg/dL (1-2 hours)      Critically ill patients:  140 - 180 mg/dL   Lab Results  Component Value Date   GLUCAP 193 (H) 07/16/2021   HGBA1C 9.6 (H) 07/15/2021    Review of Glycemic Control  Diabetes history: DM2 Outpatient Diabetes medications: U-500 60-80 units TID Current orders for Inpatient glycemic control: Semglee 15 units BID, Novolog 0-15 units TID with meals and 0-5 HS + 4 units TID  HgbA1C - 9.5% Sees Endo on a regular basis  Inpatient Diabetes Program Recommendations:    Increase Semglee to 20 units BID Increase Novolog to 7 units TID  Pt obviously eats large quantities of food at home to be taking about 180 -240 units of U-500 at home. When I asked about that, he replied "Can't you see, I'm a big boy." Stressed importance of continuing to lower HgbA1C. He mentioned that it had gone up recently.   Continue to follow.   Thank you. Lorenda Peck, RD, LDN, CDE Inpatient Diabetes Coordinator [phone]

## 2021-07-16 NOTE — Progress Notes (Signed)
PROGRESS NOTE  [name]  OHY:[id number] DOB: 1961-02-21 DOA: 07/15/2021 PCP: [name], FNP  Brief Narrative: [name] is a 60 y.o. male with a history of OSA not on CPAP, chronic 4L O2-dependence, IDT2DM, HTN, chronic HFpEF, morbid obesity, recent PE now on eliquis, recent admission for GI bleeding while on eliquis (discharged 9/10) who returned to the ED 9/22 with several days of BRBPR and intermittent epistaxis having resumed eliquis the previous week. He was hemodynamically stable in the ED with hgb 11.5g/dl. He was admitted, eliquis held and replaced by IV heparin with serial blood counts. GI was consulted, recommended extended bowel prep and colonoscopy this admission, currently planned 9/24.   Assessment & Plan: Principal Problem:   Acute GI bleeding Active Problems:   Dyslipidemia   Coronary artery disease involving native coronary artery of native heart without angina pectoris   Uncontrolled type 2 diabetes mellitus with hyperglycemia (HCC)   Chronic diastolic CHF (congestive heart failure) (HCC)   OSA (obstructive sleep apnea)   Epistaxis, recurrent   Morbid obesity ([place])   GI bleed   Pulmonary embolism (HCC)  Recurrent lower GI bleeding: Still ongoing but low level. Hemodynamically stable and hgb relatively stable. - GI consulted, planning colonoscopy after extended prep on 9/24. NPO p MN. - Serial H/H with transfusion threshold of 8g/dl if actively bleeding. If bleeding becomes brisk/unstable would get stat CTA and DC heparin. - Of course, holding eliquis - Empiric PPI  LLE DVT/PE: Moderate clot burden.  - After discussion with hematology, Dr. Benay Spice, we will plan to continue on IV heparin for now as this can be quickly turned off/reversed and transition to lovenox ideally for anticoagulation as an outpatient. Coumadin is an alternative option. Duration would ideally be 3-6 months.    Dyslipidemia - Restart statin   CAD, chronic HFpEF: No current  angina. CHF compensated. Echo 06/28/21 with LVEF 60-65%, no RWMA.  - Continue home medications including lasix TID, imdur, coreg    T wave inversion in lateral leads: Asymptomatic. No wall motion abnormalities on recent echo. Troponin normal, BNP normal.  - Continue telemetry.     Uncontrolled IDT2DM with hyperglycemia: HbA1c 10.4% on 9/4 - Increase semglee to 20u BID (far, far below home dosing due to difference in po intake). Increase novolog to [facility] and continue SSI. - Appreciate diabetes coordinator involvement.   Chronic hypoxic respiratory failure, COPD, OSA:  - Continue supplemental oxygen with ambulation and nocturnally which is his baseline.  - Continue inhaled bronchodilators. No exacerbation currently.    Epistaxis: Resolved for now   Hypokalemia: Resolved with supplementation.  - Low-normal level this morning, but will get TID lasix today. Continue supplement again today.  Prolonged QT interval: Continues.  - Avoid provocative agents - Supp Mg and K.   Morbid obesity: Estimated body mass index is 49.77 kg/m as calculated from the following:   Height as of 06/30/21: 5\' 10"  (1.778 m).   Weight as of this encounter: 157.4 kg.  DVT prophylaxis: IV heparin Code Status: Full Family Communication: None at bedside Disposition Plan:  Status is: Inpatient  Remains inpatient appropriate because:Ongoing diagnostic testing needed not appropriate for outpatient work up and Inpatient level of care appropriate due to severity of illness  Dispo: The patient is from: Home              Anticipated d/c is to: Home              Patient currently is not  medically stable to d/c.  Consultants:  [facility], Dr. Michail Sermon Hematology, Dr. Benay Spice  Procedures:  Colonoscopy planned 9/24  Antimicrobials: None   Subjective: Still having mild-moderate volume of blood in frequent loose stools since starting bowel prep. Had an episode of vomiting last night of the prep with no further  episodes, hematemesis, or current nausea. No epistaxis since arrival. Denies orthostatic symptoms or other bleeding. Does not enjoy the prep.  Objective: Vitals:   07/16/21 0019 07/16/21 0434 07/16/21 0813 07/16/21 1225  BP: (!) 151/71 (!) 148/80  139/73  Pulse: 73 72  66  Resp: 20 18  16   Temp: 98.4 F (36.9 C) 98 F (36.7 C)  98.4 F (36.9 C)  TempSrc: Oral Oral  Oral  SpO2: 92% 100% 97% 99%  Weight:        Intake/Output Summary (Last 24 hours) at 07/16/2021 1539 Last data filed at 07/16/2021 0334 Gross per 24 hour  Intake 128.66 ml  Output 400 ml  Net -271.34 ml   Filed Weights   07/15/21 2000  Weight: (!) 157.4 kg   Gen: 60 y.o. male in no distress Pulm: Non-labored breathing room air. Clear, distant to auscultation bilaterally.  CV: Regular rate and rhythm. No murmur, rub, or gallop. UTD JVD, trace woody pitting LE edema. GI: Abdomen obese, soft, non-tender, non-distended, with normoactive bowel sounds. No organomegaly or masses felt. Ext: Warm, no deformities Skin: No acute rashes, lesions or ulcers on visualized skin. Neuro: Alert and oriented. No focal neurological deficits. Psych: Judgement and insight appear normal. Mood & affect appropriate.   Data Reviewed: I have personally reviewed following labs and imaging studies  CBC: Recent Labs  Lab 07/15/21 1236 07/16/21 0539  WBC 8.5 9.3  HGB 11.5* 11.2*  HCT 34.8* 34.5*  MCV 95.6 94.8  PLT 246 408   Basic Metabolic Panel: Recent Labs  Lab 07/15/21 1236 07/16/21 0539  NA 141 137  K 3.4* 3.5  CL 102 100  CO2 28 25  GLUCOSE 228* 204*  BUN 15 11  CREATININE 0.77 0.68  CALCIUM 9.4 9.0   GFR: Estimated Creatinine Clearance: 153.9 mL/min (by C-G formula based on SCr of 0.68 mg/dL). Liver Function Tests: Recent Labs  Lab 07/15/21 1236  AST 25  ALT 26  ALKPHOS 45  BILITOT 0.8  PROT 7.3  ALBUMIN 3.7   No results for input(s): LIPASE, AMYLASE in the last 168 hours. No results for input(s):  AMMONIA in the last 168 hours. Coagulation Profile: No results for input(s): INR, PROTIME in the last 168 hours. Cardiac Enzymes: No results for input(s): CKTOTAL, CKMB, CKMBINDEX, TROPONINI in the last 168 hours. BNP (last 3 results) No results for input(s): PROBNP in the last 8760 hours. HbA1C: Recent Labs    07/15/21 1611  HGBA1C 9.6*   CBG: Recent Labs  Lab 07/15/21 1728 07/15/21 2111 07/16/21 0439 07/16/21 0742 07/16/21 1126  GLUCAP 160* 169* 206* 182* 193*   Lipid Profile: No results for input(s): CHOL, HDL, LDLCALC, TRIG, CHOLHDL, LDLDIRECT in the last 72 hours. Thyroid Function Tests: No results for input(s): TSH, T4TOTAL, FREET4, T3FREE, THYROIDAB in the last 72 hours. Anemia Panel: No results for input(s): VITAMINB12, FOLATE, FERRITIN, TIBC, IRON, RETICCTPCT in the last 72 hours. Urine analysis:    Component Value Date/Time   COLORURINE COLORLESS (A) 06/05/2021 1422   APPEARANCEUR CLEAR 06/05/2021 1422   LABSPEC 1.005 06/05/2021 1422   PHURINE 7.0 06/05/2021 1422   GLUCOSEU 150 (A) 06/05/2021 1422   HGBUR SMALL (  A) 06/05/2021 [place] 06/05/2021 [place] 06/05/2021 1422   PROTEINUR NEGATIVE 06/05/2021 1422   UROBILINOGEN 0.2 05/27/2014 2312   NITRITE NEGATIVE 06/05/2021 1422   LEUKOCYTESUR NEGATIVE 06/05/2021 1422   No results found for this or any previous visit (from the past 240 hour(s)).    Radiology Studies: DG Chest Port 1 View  Result Date: 07/15/2021 CLINICAL DATA:  Intermittent nosebleeds. EXAM: PORTABLE CHEST 1 VIEW COMPARISON:  06/30/2021 FINDINGS: The cardiac silhouette, mediastinal and hilar contours are within normal limits and stable. The lungs are clear. No pleural effusions. The bony thorax is intact. IMPRESSION: No acute cardiopulmonary findings. Electronically Signed   By: Marijo Sanes M.D.   On: 07/15/2021 13:41    Scheduled Meds:  carvedilol  12.5 mg Oral BID   furosemide  40 mg Oral TID WC    hydrocerin   Topical BID   insulin aspart  0-15 Units Subcutaneous TID WC   insulin aspart  0-5 Units Subcutaneous QHS   insulin aspart  7 Units Subcutaneous TID WC   insulin glargine-yfgn  15 Units Subcutaneous BID   ipratropium-albuterol  3 mL Nebulization BID   isosorbide mononitrate  30 mg Oral Daily   pantoprazole (PROTONIX) IV  40 mg Intravenous Q12H   potassium chloride  20 mEq Oral Daily   sodium chloride flush  3 mL Intravenous Q12H   Continuous Infusions:  sodium chloride     heparin 2,400 Units/hr (07/16/21 0852)     LOS: 1 day   Time spent: 25 minutes.  Patrecia Pour, MD [facility] [email or web address] 07/16/2021, 3:39 PM

## 2021-07-16 NOTE — Progress Notes (Signed)
[facility] Gastroenterology Progress Note  [name] 60 y.o. [birth year]  CC:  GI bleed   Subjective: Patient states he had bloody, loose Bms last night. Reports vomiting his prep last night due to drinking it too fast. Is doing well drinking his prep this morning and is doing so slowly through a straw. Denies nausea or vomiting this morning. Denies abdominal pain.  ROS : Review of Systems  Cardiovascular:  Negative for chest pain and palpitations.  Gastrointestinal:  Positive for blood in stool and vomiting (last night due to prep - resolved this morning). Negative for abdominal pain, constipation, diarrhea, heartburn and nausea.     Objective: Vital signs in last 24 hours: Vitals:   07/16/21 0434 07/16/21 0813  BP: (!) 148/80   Pulse: 72   Resp: 18   Temp: 98 F (36.7 C)   SpO2: 100% 97%    Physical Exam:  General:  Alert, cooperative, no distress, morbidly obese sitting comfortably in chair  Head:  Normocephalic, without obvious abnormality, atraumatic  Eyes:  Anicteric sclera, EOM's intact  Lungs:   Clear to auscultation bilaterally, respirations unlabored  Heart:  Regular rate and rhythm, S1, S2 normal  Abdomen:   Soft, non-tender, bowel sounds active all four quadrants,  no masses, truncal obesity    Lab Results: Recent Labs    07/15/21 1236 07/16/21 0539  NA 141 137  K 3.4* 3.5  CL 102 100  CO2 28 25  GLUCOSE 228* 204*  BUN 15 11  CREATININE 0.77 0.68  CALCIUM 9.4 9.0   Recent Labs    07/15/21 1236  AST 25  ALT 26  ALKPHOS 45  BILITOT 0.8  PROT 7.3  ALBUMIN 3.7   Recent Labs    07/15/21 1236 07/16/21 0539  WBC 8.5 9.3  HGB 11.5* 11.2*  HCT 34.8* 34.5*  MCV 95.6 94.8  PLT 246 244   No results for input(s): LABPROT, INR in the last 72 hours.    Assessment GI Bleed, anemia - profuse hematochezia likely due to diverticular bleed, however, cannot r/o malignancy - HGB 11.2 (decreased from 11.5 yesterday) -  Normal BUN/Cr  11/0.68  Recent acute PE, prior history of left DVT/PE  Chronic diastolic CHF - Recent 2D echo on 06/28/2021 had shown EF of 60-65%  Diabetes type 2  OSA   Plan: Plan for Colonoscopy tomorrow. I thoroughly discussed the procedures to include nature, alternatives, benefits, and risks including but not limited to bleeding, perforation, infection, anesthesia/cardiac and pulmonary complications. Patient provides understanding and gave verbal consent to proceed.   Nulytely prep, clear liquid diet, NPO at midnight. Will proceed with colonoscopy if clear stools.   Continue daily CBC with transfusion as needed to maintain Hgb >7.   If destabilizing bleeding occurs, recommend CTA followed by IR consultation if positive.   [facility] will follow.      Garnette Scheuermann PA-C 07/16/2021, 10:53 AM  Contact #  [phone]

## 2021-07-16 NOTE — Progress Notes (Addendum)
RT inquired pt about cpap since he is on cpap at home. Pt refused and said he is ok with O2 and doesn't need cpap. He said last time he wore the cpap many years ago.

## 2021-07-16 NOTE — Progress Notes (Signed)
Patient has had 6+ watery BM's first 2 had bloody appearance (MD aware) the rest of the BM's have been watery yellow/brown. Bowel prep for colonoscopy 9/24 given throughout day.

## 2021-07-16 NOTE — TOC Progression Note (Signed)
Transition of Care [facility]) - Progression Note    Patient Details  Name: [name] MRN: [id number] Date of Birth: 04/08/1961  [facility]) CM/SW Contact  Purcell Mouton, RN Phone Number: 07/16/2021, 10:16 AM  Clinical Narrative:     Pt from home and plan to discharge home when stable.   Expected Discharge Plan: Home/Self Care Barriers to Discharge: No Barriers Identified  Expected Discharge Plan and Services Expected Discharge Plan: Home/Self Care       Living arrangements for the past 2 months: Single Family Home Expected Discharge Date:  (unknown)                                     Social Determinants of Health (SDOH) Interventions    Readmission Risk Interventions Readmission Risk Prevention Plan 06/29/2021  Transportation Screening Complete  Medication Review [employer]) Complete  PCP or Specialist appointment within 3-5 days of discharge Complete  HRI or [place] Complete  SW Recovery Care/Counseling Consult Complete  Opal Not Applicable  Some recent data might be hidden

## 2021-07-16 NOTE — Progress Notes (Signed)
ANTICOAGULATION CONSULT NOTE  Pharmacy Consult for IV heparin Indication: recent PE, DOAC on hold for rectal bleeding  Allergies  Allergen Reactions   Coconut Flavor [Flavoring Agent] Hives   Coconut Oil Hives   Ibuprofen Other (See Comments)    Made gastric ulcers worse   Aleve [Naproxen] Other (See Comments)    DUE TO KIDNEYS   Nsaids Other (See Comments)    Stomach ulcers    Vascepa [Icosapent Ethyl] Other (See Comments)    headaches, chest pain, similar to sx of a stroke, hypotension     Patient Measurements: Weight: (!) 157.4 kg (346 lb 14.4 oz) Heparin Dosing Weight: 111 kg  Vital Signs: Temp: 98 F (36.7 C) (09/23 0434) Temp Source: Oral (09/23 0434) BP: 148/80 (09/23 0434) Pulse Rate: 72 (09/23 0434)  Labs: Recent Labs    07/15/21 1236 07/15/21 1611 07/16/21 0539  HGB 11.5*  --  11.2*  HCT 34.8*  --  34.5*  PLT 246  --  244  APTT  --   --  97*  HEPARINUNFRC  --   --  >1.10*  CREATININE 0.77  --  0.68  TROPONINIHS  --  8  --      Estimated Creatinine Clearance: 153.9 mL/min (by C-G formula based on SCr of 0.68 mg/dL).   Medical History: Past Medical History:  Diagnosis Date   Anemia    Arthritis    Back pain    CAD (coronary artery disease)    a. s/p DES to LAD in 05/2016   Cervical radiculopathy    Chronic diastolic CHF (congestive heart failure) (HCC)    Chronic pain    Depression    DVT (deep venous thrombosis) (HCC)    Hematemesis    Hepatic steatosis    Hyperlipidemia    Hypertension    IBS (irritable bowel syndrome)    Morbid obesity (HCC)    OSA (obstructive sleep apnea)    Pancreatitis    PE (pulmonary thromboembolism) ([place])    Peripheral neuropathy    PUD (peptic ulcer disease)    Renal disorder    Stroke [facility])    a. ?details unclear - not seen on imaging when he was admitted in 05/2017 for TIA symptoms which were felt due to cervical radiculopathy.   Thoracic aortic ectasia (HCC)    a. 4.3cm ectatic ascending thoracic aorta  by CT 06/2017.    Type 2 diabetes mellitus (HCC)     Medications:  Medications Prior to Admission  Medication Sig Dispense Refill Last Dose   albuterol (VENTOLIN HFA) 108 (90 Base) MCG/ACT inhaler Inhale 2 puffs into the lungs every 6 (six) hours as needed for wheezing or shortness of breath. As Directed   07/14/2021   apixaban (ELIQUIS) 5 MG TABS tablet Take 2 tablets (10mg ) twice daily for 7 days, then 1 tablet (5mg ) twice daily (Patient taking differently: Take 5 mg by mouth 2 (two) times daily.) 60 tablet 0 07/15/2021 at 0900   atorvastatin (LIPITOR) 40 MG tablet Take 40 mg by mouth at bedtime.   07/14/2021   carvedilol (COREG) 12.5 MG tablet Take 12.5 mg by mouth 2 (two) times daily.   07/15/2021   famotidine (PEPCID) 20 MG tablet Take 20 mg by mouth daily.   07/14/2021   fenofibrate (TRICOR) 145 MG tablet Take 1 tablet (145 mg total) by mouth daily. 90 tablet 2 07/15/2021   furosemide (LASIX) 40 MG tablet Take 1 tablet (40 mg total) by mouth daily. PLEASE RESUME ONLY FROM  10/21/2020. DOSE HAS BEEN CHANGED. (Patient taking differently: Take 40 mg by mouth 3 (three) times daily.) 90 tablet 0 07/15/2021   glipiZIDE (GLUCOTROL) 10 MG tablet Take 10 mg by mouth 2 (two) times daily.  4 07/15/2021   HUMULIN R U-500 KWIKPEN 500 UNIT/ML kwikpen Inject 80-90 Units into the skin in the morning, at noon, and at bedtime. Per sliding scale   07/15/2021   ipratropium-albuterol (DUONEB) 0.5-2.5 (3) MG/3ML SOLN Take 3 mLs by nebulization 2 (two) times daily.   07/14/2021   isosorbide mononitrate (IMDUR) 30 MG 24 hr tablet TAKE 1 TABLET BY MOUTH DAILY (Patient taking differently: Take 30 mg by mouth daily.) 90 tablet 3 07/15/2021   lisinopril (ZESTRIL) 5 MG tablet Take 5 mg by mouth daily.   07/15/2021   nitroGLYCERIN (NITROSTAT) 0.4 MG SL tablet Place 1 tablet (0.4 mg total) under the tongue every 5 (five) minutes as needed for chest pain. 100 tablet 1 unk   ondansetron (ZOFRAN) 4 MG tablet Take 4 mg by mouth 2 (two)  times daily as needed for nausea/vomiting.   Past Week   Oxycodone HCl 20 MG TABS Take 20 mg by mouth 4 (four) times daily as needed (pain).   07/14/2021   pantoprazole (PROTONIX) 40 MG tablet Take 40 mg by mouth daily.   07/15/2021   potassium chloride (MICRO-K) 10 MEQ CR capsule Take 20 mEq by mouth daily.   07/15/2021   sertraline (ZOLOFT) 50 MG tablet Take 50 mg by mouth daily.   07/15/2021   tiZANidine (ZANAFLEX) 4 MG tablet Take 4 mg by mouth at bedtime as needed for muscle spasms.   07/14/2021   traZODone (DESYREL) 50 MG tablet Take 1 tablet (50 mg total) by mouth at bedtime. 30 tablet 11 07/14/2021   Vitamin D, Ergocalciferol, (DRISDOL) 1.25 MG (50000 UT) CAPS capsule Take 50,000 Units by mouth every Monday.   Past Week   Accu-Chek FastClix Lancets MISC 1 Device by Other route 2 (two) times daily.       ACCU-CHEK GUIDE test strip 1 each by Other route 2 (two) times daily.      Insulin Pen Needle 31G X 5 MM MISC Use 1 needle daily to inject insulin as prescribed (Patient taking differently: 1 each. Use 1 needle daily to inject insulin as prescribed) 100 each 2    Scheduled:   carvedilol  12.5 mg Oral BID   furosemide  40 mg Oral TID WC   hydrocerin   Topical BID   insulin aspart  0-15 Units Subcutaneous TID WC   insulin aspart  0-5 Units Subcutaneous QHS   insulin aspart  4 Units Subcutaneous TID WC   insulin glargine-yfgn  15 Units Subcutaneous BID   ipratropium-albuterol  3 mL Nebulization BID   isosorbide mononitrate  30 mg Oral Daily   pantoprazole (PROTONIX) IV  40 mg Intravenous Q12H   polyethylene glycol-electrolytes  4,000 mL Oral Once   potassium chloride  20 mEq Oral Daily   sodium chloride flush  3 mL Intravenous Q12H   Infusions:   sodium chloride     heparin 2,500 Units/hr (07/16/21 0729)    Assessment: 89 yoM with PMH OSA/OHS on 4L O2 at baseline, dCHF, on Eliquis for recent PE diagnosed 9/3, subsequently admitted 9/7-9/10 for GI bleeding requiring Eliquis held through  9/11, now presents with recurrent rectal bleeding x 2 days. Will need colonoscopy. Eliquis held and Pharmacy consulted to dose IV heparin. NO BOLUS given active GI bleeding.  Baseline  INR, aPTT: not done, heparin level presumed to be elevated in setting of recent dose and GIB Prior anticoagulation: Eliquis 5 mg PO BID; LD 9/22 at 0900  Significant events:  Today, 07/16/2021: HL > 1.1 is falsely elevated due to recent DOAC. aPTT = 97 seconds is therapeutic, but on upper end of therapeutic range CBC: Hgb 11.2 is slightly low but stable; Plt WNL/stable.  Confirmed with RN that heparin infusing at correct rate. Reported that pt had a bloody stool overnight, no other signs of bleeding.   Goal of Therapy: Heparin level 0.3-0.7 units/ml AND aPTT 66-102 seconds Monitor platelets by anticoagulation protocol: Yes  Plan: Decrease heparin infusion to 2400 units/hr since aPTT on upper end of therapeutic range and pt having rectal bleeding Check aPTT in 6 hours CBC, aPTT/HL daily while on heparin infusion. Once aPTT and HL correlate, can monitor using HL only Monitor for signs of worsening bleeding or thrombosis F/u plans for long-term anticoag (HemOnc recommending alternative agent such as LMWH or warfarin at discharge)  Lenis Noon, PharmD 07/16/21 8:32 AM

## 2021-07-16 NOTE — Progress Notes (Signed)
ANTICOAGULATION CONSULT NOTE  Pharmacy Consult for IV heparin Indication: recent PE, DOAC on hold for rectal bleeding  Allergies  Allergen Reactions   Coconut Flavor [Flavoring Agent] Hives   Coconut Oil Hives   Ibuprofen Other (See Comments)    Made gastric ulcers worse   Aleve [Naproxen] Other (See Comments)    DUE TO KIDNEYS   Nsaids Other (See Comments)    Stomach ulcers    Vascepa [Icosapent Ethyl] Other (See Comments)    headaches, chest pain, similar to sx of a stroke, hypotension     Patient Measurements: Weight: (!) 157.4 kg (346 lb 14.4 oz) Heparin Dosing Weight: 111 kg  Vital Signs: Temp: 98.4 F (36.9 C) (09/23 1225) Temp Source: Oral (09/23 1225) BP: 139/73 (09/23 1225) Pulse Rate: 66 (09/23 1225)  Labs: Recent Labs    07/15/21 1236 07/15/21 1611 07/16/21 0539 07/16/21 1550  HGB 11.5*  --  11.2* 11.3*  HCT 34.8*  --  34.5* 34.4*  PLT 246  --  244  --   APTT  --   --  97* 95*  HEPARINUNFRC  --   --  >1.10*  --   CREATININE 0.77  --  0.68  --   TROPONINIHS  --  8  --   --      Estimated Creatinine Clearance: 153.9 mL/min (by C-G formula based on SCr of 0.68 mg/dL).   Medical History: Past Medical History:  Diagnosis Date   Anemia    Arthritis    Back pain    CAD (coronary artery disease)    a. s/p DES to LAD in 05/2016   Cervical radiculopathy    Chronic diastolic CHF (congestive heart failure) (HCC)    Chronic pain    Depression    DVT (deep venous thrombosis) (HCC)    Hematemesis    Hepatic steatosis    Hyperlipidemia    Hypertension    IBS (irritable bowel syndrome)    Morbid obesity (HCC)    OSA (obstructive sleep apnea)    Pancreatitis    PE (pulmonary thromboembolism) ([facility])    Peripheral neuropathy    PUD (peptic ulcer disease)    Renal disorder    Stroke [facility])    a. ?details unclear - not seen on imaging when he was admitted in 05/2017 for TIA symptoms which were felt due to cervical radiculopathy.   Thoracic aortic  ectasia (HCC)    a. 4.3cm ectatic ascending thoracic aorta by CT 06/2017.    Type 2 diabetes mellitus (HCC)     Medications:  Medications Prior to Admission  Medication Sig Dispense Refill Last Dose   albuterol (VENTOLIN HFA) 108 (90 Base) MCG/ACT inhaler Inhale 2 puffs into the lungs every 6 (six) hours as needed for wheezing or shortness of breath. As Directed   07/14/2021   apixaban (ELIQUIS) 5 MG TABS tablet Take 2 tablets (10mg ) twice daily for 7 days, then 1 tablet (5mg ) twice daily (Patient taking differently: Take 5 mg by mouth 2 (two) times daily.) 60 tablet 0 07/15/2021 at 0900   atorvastatin (LIPITOR) 40 MG tablet Take 40 mg by mouth at bedtime.   07/14/2021   carvedilol (COREG) 12.5 MG tablet Take 12.5 mg by mouth 2 (two) times daily.   07/15/2021   famotidine (PEPCID) 20 MG tablet Take 20 mg by mouth daily.   07/14/2021   fenofibrate (TRICOR) 145 MG tablet Take 1 tablet (145 mg total) by mouth daily. 90 tablet 2 07/15/2021   furosemide (  LASIX) 40 MG tablet Take 1 tablet (40 mg total) by mouth daily. PLEASE RESUME ONLY FROM 10/21/2020. DOSE HAS BEEN CHANGED. (Patient taking differently: Take 40 mg by mouth 3 (three) times daily.) 90 tablet 0 07/15/2021   glipiZIDE (GLUCOTROL) 10 MG tablet Take 10 mg by mouth 2 (two) times daily.  4 07/15/2021   HUMULIN R U-500 KWIKPEN 500 UNIT/ML kwikpen Inject 80-90 Units into the skin in the morning, at noon, and at bedtime. Per sliding scale   07/15/2021   ipratropium-albuterol (DUONEB) 0.5-2.5 (3) MG/3ML SOLN Take 3 mLs by nebulization 2 (two) times daily.   07/14/2021   isosorbide mononitrate (IMDUR) 30 MG 24 hr tablet TAKE 1 TABLET BY MOUTH DAILY (Patient taking differently: Take 30 mg by mouth daily.) 90 tablet 3 07/15/2021   lisinopril (ZESTRIL) 5 MG tablet Take 5 mg by mouth daily.   07/15/2021   nitroGLYCERIN (NITROSTAT) 0.4 MG SL tablet Place 1 tablet (0.4 mg total) under the tongue every 5 (five) minutes as needed for chest pain. 100 tablet 1 unk    ondansetron (ZOFRAN) 4 MG tablet Take 4 mg by mouth 2 (two) times daily as needed for nausea/vomiting.   Past Week   Oxycodone HCl 20 MG TABS Take 20 mg by mouth 4 (four) times daily as needed (pain).   07/14/2021   pantoprazole (PROTONIX) 40 MG tablet Take 40 mg by mouth daily.   07/15/2021   potassium chloride (MICRO-K) 10 MEQ CR capsule Take 20 mEq by mouth daily.   07/15/2021   sertraline (ZOLOFT) 50 MG tablet Take 50 mg by mouth daily.   07/15/2021   tiZANidine (ZANAFLEX) 4 MG tablet Take 4 mg by mouth at bedtime as needed for muscle spasms.   07/14/2021   traZODone (DESYREL) 50 MG tablet Take 1 tablet (50 mg total) by mouth at bedtime. 30 tablet 11 07/14/2021   Vitamin D, Ergocalciferol, (DRISDOL) 1.25 MG (50000 UT) CAPS capsule Take 50,000 Units by mouth every Monday.   Past Week   Accu-Chek FastClix Lancets MISC 1 Device by Other route 2 (two) times daily.       ACCU-CHEK GUIDE test strip 1 each by Other route 2 (two) times daily.      Insulin Pen Needle 31G X 5 MM MISC Use 1 needle daily to inject insulin as prescribed (Patient taking differently: 1 each. Use 1 needle daily to inject insulin as prescribed) 100 each 2    Scheduled:   carvedilol  12.5 mg Oral BID   furosemide  40 mg Oral TID WC   hydrocerin   Topical BID   insulin aspart  0-15 Units Subcutaneous TID WC   insulin aspart  0-5 Units Subcutaneous QHS   insulin aspart  7 Units Subcutaneous TID WC   insulin glargine-yfgn  20 Units Subcutaneous BID   ipratropium-albuterol  3 mL Nebulization BID   isosorbide mononitrate  30 mg Oral Daily   pantoprazole (PROTONIX) IV  40 mg Intravenous Q12H   potassium chloride  40 mEq Oral Daily   sodium chloride flush  3 mL Intravenous Q12H   Infusions:   sodium chloride     heparin 2,400 Units/hr (07/16/21 1753)   magnesium sulfate bolus IVPB      Assessment: 37 yoM with PMH OSA/OHS on 4L O2 at baseline, dCHF, on Eliquis for recent PE diagnosed 9/3, subsequently admitted 9/7-9/10 for GI  bleeding requiring Eliquis held through 9/11, now presents with recurrent rectal bleeding x 2 days. Will need colonoscopy. Eliquis  held and Pharmacy consulted to dose IV heparin. NO BOLUS given active GI bleeding.  Baseline INR, aPTT: not done, heparin level presumed to be elevated in setting of recent dose and GIB Prior anticoagulation: Eliquis 5 mg PO BID; LD 9/22 at 0900  Significant events:  Today, 07/16/2021: HL > 1.1 is falsely elevated due to recent DOAC. aPTT = 97 seconds is therapeutic, but on upper end of therapeutic range CBC: Hgb 11.2 is slightly low but stable; Plt WNL/stable.  Confirmed with RN that heparin infusing at correct rate. Reported that pt had a bloody stool overnight, no other signs of bleeding.   07/16/21 6:20 PM  aPTT 95 No bleeding reported except resolved epistaxis, ongoing low level GIB which has improved somewhat per RN  Goal of Therapy: Heparin level 0.3-0.7 units/ml AND aPTT 66-102 seconds Monitor platelets by anticoagulation protocol: Yes  Plan: Continue heparin infusion qt 2400 units/hr  CBC, aPTT/HL daily while on heparin infusion. Once aPTT and HL correlate, can monitor using HL only Monitor for signs of worsening bleeding or thrombosis F/u plans for long-term anticoag (HemOnc recommending alternative agent such as LMWH or warfarin at discharge)  Napoleon Form 07/16/21 6:20 PM

## 2021-07-17 ENCOUNTER — Inpatient Hospital Stay (HOSPITAL_COMMUNITY): Payer: Medicaid Other | Admitting: Anesthesiology

## 2021-07-17 ENCOUNTER — Encounter (HOSPITAL_COMMUNITY): Payer: Self-pay | Admitting: Internal Medicine

## 2021-07-17 ENCOUNTER — Encounter (HOSPITAL_COMMUNITY)
Admission: EM | Disposition: A | Discharge status: Home / Self Care | Payer: Self-pay | Source: Home / Self Care | Attending: Family Medicine

## 2021-07-17 DIAGNOSIS — I5032 Chronic diastolic (congestive) heart failure: Secondary | ICD-10-CM | POA: Diagnosis not present

## 2021-07-17 DIAGNOSIS — I2510 Atherosclerotic heart disease of native coronary artery without angina pectoris: Secondary | ICD-10-CM | POA: Diagnosis not present

## 2021-07-17 DIAGNOSIS — E785 Hyperlipidemia, unspecified: Secondary | ICD-10-CM | POA: Diagnosis not present

## 2021-07-17 DIAGNOSIS — K922 Gastrointestinal hemorrhage, unspecified: Secondary | ICD-10-CM | POA: Diagnosis not present

## 2021-07-17 HISTORY — PX: ESOPHAGOGASTRODUODENOSCOPY (EGD) WITH PROPOFOL: SHX5813

## 2021-07-17 HISTORY — PX: POLYPECTOMY: SHX5525

## 2021-07-17 HISTORY — PX: COLONOSCOPY: SHX5424

## 2021-07-17 LAB — CBC
HCT: 35.9 % — ABNORMAL LOW (ref 39.0–52.0)
Hemoglobin: 11.7 g/dL — ABNORMAL LOW (ref 13.0–17.0)
MCH: 30.9 pg (ref 26.0–34.0)
MCHC: 32.6 g/dL (ref 30.0–36.0)
MCV: 94.7 fL (ref 80.0–100.0)
Platelets: 261 10*3/uL (ref 150–400)
RBC: 3.79 MIL/uL — ABNORMAL LOW (ref 4.22–5.81)
RDW: 13.7 % (ref 11.5–15.5)
WBC: 8.5 10*3/uL (ref 4.0–10.5)
nRBC: 0 % (ref 0.0–0.2)

## 2021-07-17 LAB — MAGNESIUM: Magnesium: 2.1 mg/dL (ref 1.7–2.4)

## 2021-07-17 LAB — BASIC METABOLIC PANEL
Anion gap: 9 (ref 5–15)
BUN: 8 mg/dL (ref 6–20)
CO2: 27 mmol/L (ref 22–32)
Calcium: 8.9 mg/dL (ref 8.9–10.3)
Chloride: 98 mmol/L (ref 98–111)
Creatinine, Ser: 0.68 mg/dL (ref 0.61–1.24)
GFR, Estimated: >60 mL/min (ref >60)
Glucose, Bld: 223 mg/dL — ABNORMAL HIGH (ref 70–99)
Potassium: 3.7 mmol/L (ref 3.5–5.1)
Sodium: 134 mmol/L — ABNORMAL LOW (ref 135–145)

## 2021-07-17 LAB — HEPARIN LEVEL (UNFRACTIONATED): Heparin Unfractionated: 0.62 IU/mL (ref 0.30–0.70)

## 2021-07-17 LAB — GLUCOSE, CAPILLARY
Glucose-Capillary: 179 mg/dL — ABNORMAL HIGH (ref 70–99)
Glucose-Capillary: 185 mg/dL — ABNORMAL HIGH (ref 70–99)
Glucose-Capillary: 192 mg/dL — ABNORMAL HIGH (ref 70–99)
Glucose-Capillary: 202 mg/dL — ABNORMAL HIGH (ref 70–99)
Glucose-Capillary: 208 mg/dL — ABNORMAL HIGH (ref 70–99)
Glucose-Capillary: 212 mg/dL — ABNORMAL HIGH (ref 70–99)
Glucose-Capillary: 272 mg/dL — ABNORMAL HIGH (ref 70–99)
Glucose-Capillary: 289 mg/dL — ABNORMAL HIGH (ref 70–99)

## 2021-07-17 SURGERY — COLONOSCOPY
Anesthesia: Monitor Anesthesia Care

## 2021-07-17 MED ORDER — GLYCOPYRROLATE 0.2 MG/ML IJ SOLN
INTRAMUSCULAR | Status: DC | PRN
  Administered 2021-07-17: .1 mg via INTRAVENOUS

## 2021-07-17 MED ORDER — LIDOCAINE HCL (CARDIAC) PF 100 MG/5ML IV SOSY
PREFILLED_SYRINGE | INTRAVENOUS | Status: DC | PRN
  Administered 2021-07-17: 100 mg via INTRAVENOUS

## 2021-07-17 MED ORDER — ONDANSETRON HCL 4 MG/2ML IJ SOLN
INTRAMUSCULAR | Status: DC | PRN
  Administered 2021-07-17: 4 mg via INTRAVENOUS

## 2021-07-17 MED ORDER — ENOXAPARIN (LOVENOX) PATIENT EDUCATION KIT
PACK | Freq: Once | Status: AC
  Filled 2021-07-17: qty 1

## 2021-07-17 MED ORDER — ENOXAPARIN SODIUM 150 MG/ML IJ SOSY
150.0000 mg | PREFILLED_SYRINGE | Freq: Two times a day (BID) | INTRAMUSCULAR | Status: DC
  Administered 2021-07-17 – 2021-07-18 (×2): 150 mg via SUBCUTANEOUS
  Filled 2021-07-17 (×3): qty 1

## 2021-07-17 MED ORDER — PROPOFOL 500 MG/50ML IV EMUL
INTRAVENOUS | Status: DC | PRN
  Administered 2021-07-17: 150 ug/kg/min via INTRAVENOUS

## 2021-07-17 MED ORDER — PHENYLEPHRINE HCL (PRESSORS) 10 MG/ML IV SOLN
INTRAVENOUS | Status: DC | PRN
  Administered 2021-07-17 (×2): 120 ug via INTRAVENOUS
  Administered 2021-07-17: 160 ug via INTRAVENOUS
  Administered 2021-07-17: 80 ug via INTRAVENOUS
  Administered 2021-07-17: 160 ug via INTRAVENOUS

## 2021-07-17 MED ORDER — LACTATED RINGERS IV SOLN
INTRAVENOUS | Status: DC | PRN
  Administered 2021-07-17: 1000 mL via INTRAVENOUS

## 2021-07-17 MED ORDER — OXYCODONE HCL 5 MG PO TABS
20.0000 mg | ORAL_TABLET | Freq: Four times a day (QID) | ORAL | Status: DC | PRN
  Administered 2021-07-17: 20 mg via ORAL
  Filled 2021-07-17: qty 4

## 2021-07-17 MED ORDER — LACTATED RINGERS IV SOLN: INTRAVENOUS | Status: DC

## 2021-07-17 MED ORDER — SODIUM CHLORIDE 0.9 % IV SOLN: INTRAVENOUS | Status: DC

## 2021-07-17 NOTE — Anesthesia Preprocedure Evaluation (Addendum)
Anesthesia Evaluation  Patient identified by MRN, date of birth, ID band Patient awake    Reviewed: Allergy & Precautions, NPO status , Patient's Chart, lab work & pertinent test results  Airway Mallampati: III  TM Distance: >3 FB Neck ROM: Full    Dental  (+) Partial Upper, Missing, Dental Advisory Given,    Pulmonary sleep apnea (4L O2 qhs) and Oxygen sleep apnea , PE   Pulmonary exam normal breath sounds clear to auscultation       Cardiovascular hypertension, Pt. on home beta blockers and Pt. on medications + CAD, + Cardiac Stents, +CHF and + DVT  Normal cardiovascular exam Rhythm:Regular Rate:Normal  TTE 2022 1. Left ventricular ejection fraction, by estimation, is 60 to 65%. The  left ventricle has normal function. The left ventricle has no regional  wall motion abnormalities. There is moderate left ventricular hypertrophy.  2. Right ventricular systolic function is normal. The right ventricular  size is normal.  3. The mitral valve is normal in structure. No evidence of mitral valve  regurgitation. No evidence of mitral stenosis.  4. The aortic valve is normal in structure. Aortic valve regurgitation is  not visualized. No aortic stenosis is present.  5. The inferior vena cava is dilated in size with >50% respiratory  variability, suggesting right atrial pressure of 8 mmHg.  Cath 2019 Previously placed Prox LAD to Mid LAD stent (Synergy DES 4.0 mm x 16 mm) is widely patent. Prox RCA lesion is 20% stenosed. The left ventricular systolic function is normal. The left ventricular ejection fraction is 55-65% by visual estimate. LV end diastolic pressure is severely elevated. 27 mmHg The left ventricular ejection fraction is 55-65% by visual estimate.   Angiographically normal coronary arteries with widely patent LAD stent.  No significant lesions noted. Technically right dominant, but anatomically  codominant.  Essential hypertension with elevated LVEDP.  This could potentially be the explanation for his symptoms.  Recommend continued aggressive blood pressure management and diuresis.    Neuro/Psych  Headaches, PSYCHIATRIC DISORDERS Depression TIACVA    GI/Hepatic Neg liver ROS, PUD, GERD  ,  Endo/Other  diabetes, Type 2, Insulin DependentMorbid obesity (BMI 50)  Renal/GU negative Renal ROS  negative genitourinary   Musculoskeletal  (+) Arthritis ,   Abdominal   Peds  Hematology  (+) Blood dyscrasia (Hgb 11.7, on heparin gtt, stopped at 0515 ), ,   Anesthesia Other Findings recent PE now on eliquis, recent admission for GI bleeding while on eliquis (discharged 9/10); returned to the ED 9/22 with several days of BRBPR and intermittent epistaxis having resumed eliquis the previous week  Reproductive/Obstetrics                           Anesthesia Physical Anesthesia Plan  ASA: 3  Anesthesia Plan: MAC   Post-op Pain Management:    Induction: Intravenous  PONV Risk Score and Plan: 1 and Propofol infusion and Treatment may vary due to age or medical condition  Airway Management Planned: Natural Airway  Additional Equipment:   Intra-op Plan:   Post-operative Plan:   Informed Consent: I have reviewed the patients History and Physical, chart, labs and discussed the procedure including the risks, benefits and alternatives for the proposed anesthesia with the patient or authorized representative who has indicated his/her understanding and acceptance.     Dental advisory given  Plan Discussed with: CRNA  Anesthesia Plan Comments:         Anesthesia  Quick Evaluation

## 2021-07-17 NOTE — Op Note (Signed)
[facility] Patient Name: [name] Procedure Date: 07/17/2021 MRN: [id number] Attending MD: Lear Ng , MD Date of Birth: 1961-05-01 CSN: [id number] Age: 60 Admit Type: Inpatient Procedure:                Colonoscopy Indications:              Last colonoscopy: July 2021, Hematochezia, Personal                            history of colonic polyps Providers:                Lear Ng, MD, Elmer Ramp. Tilden Dome, RN, Cletis Athens, Technician, Enrigue Catena, CRNA Referring MD:             hospital team Medicines:                Propofol per Anesthesia, Monitored Anesthesia Care Complications:            No immediate complications. Estimated Blood Loss:     Estimated blood loss: none. Procedure:                Pre-Anesthesia Assessment:                           - Prior to the procedure, a History and Physical                            was performed, and patient medications and                            allergies were reviewed. The patient's tolerance of                            previous anesthesia was also reviewed. The risks                            and benefits of the procedure and the sedation                            options and risks were discussed with the patient.                            All questions were answered, and informed consent                            was obtained. Prior Anticoagulants: The patient has                            taken heparin, last dose was 6 hours ago. ASA Grade                            Assessment: III - A patient with severe systemic  disease. After reviewing the risks and benefits,                            the patient was deemed in satisfactory condition to                            undergo the procedure.                           After obtaining informed consent, the colonoscope                            was passed under direct vision. Throughout the                             procedure, the patient's blood pressure, pulse, and                            oxygen saturations were monitored continuously. The                            [id number]-HQ190L ([id number]) Olympus colonoscope was                            introduced through the anus and advanced to the the                            cecum, identified by appendiceal orifice and                            ileocecal valve. The colonoscopy was performed                            without difficulty. The patient tolerated the                            procedure well. The quality of the bowel                            preparation was fair. The terminal ileum, ileocecal                            valve, appendiceal orifice, and rectum were                            photographed. Scope In: 12:12:01 PM Scope Out: 12:30:58 PM Total Procedure Duration: 0 hours 18 minutes 57 seconds  Findings:      The perianal and digital rectal examinations were normal.      A 6 mm polyp was found in the transverse colon. The polyp was sessile.       The polyp was removed with a hot snare. Resection and retrieval were       complete. Estimated blood loss: none.      A 5 mm polyp was found in the descending colon. The polyp was  sessile.       The polyp was removed with a hot snare. Resection and retrieval were       complete. Estimated blood loss: none.      Internal hemorrhoids were found during retroflexion. The hemorrhoids       were medium-sized and Grade I (internal hemorrhoids that do not       prolapse).      The terminal ileum appeared normal.      A few small-mouthed diverticula were found in the sigmoid colon. Impression:               - Preparation of the colon was fair.                           - One 6 mm polyp in the transverse colon, removed                            with a hot snare. Resected and retrieved.                           - One 5 mm polyp in the descending colon, removed                             with a hot snare. Resected and retrieved.                           - Internal hemorrhoids.                           - The examined portion of the ileum was normal.                           - Diverticulosis in the sigmoid colon. Moderate Sedation:      N/A - MAC procedure Recommendation:           - Cardiac diet.                           - Await pathology results.                           - Repeat colonoscopy for surveillance based on                            pathology results. Procedure Code(s):        --- Professional ---                           [phone], Colonoscopy, flexible; with removal of                            tumor(s), polyp(s), or other lesion(s) by snare                            technique Diagnosis Code(s):        --- Professional ---  K92.1, Melena (includes Hematochezia)                           K63.5, Polyp of colon                           K64.0, First degree hemorrhoids                           Z86.010, Personal history of colonic polyps                           K57.30, Diverticulosis of large intestine without                            perforation or abscess without bleeding CPT copyright 2019 [employer]. All rights reserved. The codes documented in this report are preliminary and upon coder review may  be revised to meet current compliance requirements. Lear Ng, MD 07/17/2021 1:07:58 PM This report has been signed electronically. Number of Addenda: 0

## 2021-07-17 NOTE — Anesthesia Postprocedure Evaluation (Signed)
Anesthesia Post Note  Patient: [name]  Procedure(s) Performed: COLONOSCOPY ESOPHAGOGASTRODUODENOSCOPY (EGD) WITH PROPOFOL POLYPECTOMY     Patient location during evaluation: PACU Anesthesia Type: MAC Level of consciousness: awake and alert Pain management: pain level controlled Vital Signs Assessment: post-procedure vital signs reviewed and stable Respiratory status: spontaneous breathing, nonlabored ventilation, respiratory function stable and patient connected to nasal cannula oxygen Cardiovascular status: stable and blood pressure returned to baseline Postop Assessment: no apparent nausea or vomiting Anesthetic complications: no   No notable events documented.  Last Vitals:  Vitals:   07/17/21 1305 07/17/21 1310  BP:    Pulse: 72 77  Resp: 17 (!) 22  Temp:  36.4 C  SpO2: 96% 92%    Last Pain:  Vitals:   07/17/21 1310  TempSrc:   PainSc: Asleep                 Witten Certain L Niyati Heinke

## 2021-07-17 NOTE — Op Note (Signed)
[facility] Patient Name: [name] Procedure Date: 07/17/2021 MRN: [id number] Attending MD: Lear Ng , MD Date of Birth: 06-Jan-1961 CSN: [id number] Age: 60 Admit Type: Inpatient Procedure:                Upper GI endoscopy Indications:              Acute post hemorrhagic anemia, Hematochezia Providers:                Wilford Corner, MD, Tory Emerald, RN, Cletis Athens, Technician, Enrigue Catena, CRNA Referring MD:             hospital team Medicines:                Propofol per Anesthesia, Monitored Anesthesia Care Complications:            No immediate complications. Estimated Blood Loss:     Estimated blood loss: none. Procedure:                Pre-Anesthesia Assessment:                           - Prior to the procedure, a History and Physical                            was performed, and patient medications and                            allergies were reviewed. The patient's tolerance of                            previous anesthesia was also reviewed. The risks                            and benefits of the procedure and the sedation                            options and risks were discussed with the patient.                            All questions were answered, and informed consent                            was obtained. Prior Anticoagulants: The patient has                            taken heparin, last dose was 6 hours ago. ASA Grade                            Assessment: III - A patient with severe systemic                            disease. After reviewing the risks and benefits,  the patient was deemed in satisfactory condition to                            undergo the procedure.                           After obtaining informed consent, the endoscope was                            passed under direct vision. Throughout the                            procedure, the patient's blood  pressure, pulse, and                            oxygen saturations were monitored continuously. The                            [id number]-[id number] ([id number]) Olympus endoscope was introduced                            through the mouth, and advanced to the second part                            of duodenum. The upper GI endoscopy was                            accomplished without difficulty. The patient                            tolerated the procedure well. Scope In: Scope Out: Findings:      The examined esophagus was normal.      Segmental mild inflammation characterized by linear erosions was found       in the gastric antrum.      The cardia and gastric fundus were normal on retroflexion.      The examined duodenum was normal. Impression:               - Normal esophagus.                           - Acute gastritis.                           - Normal examined duodenum.                           - No specimens collected. Moderate Sedation:      N/A - MAC procedure Recommendation:           - Cardiac diet.                           - Observe patient's clinical course. Procedure Code(s):        --- Professional ---  02284, Esophagogastroduodenoscopy, flexible,                            transoral; diagnostic, including collection of                            specimen(s) by brushing or washing, when performed                            (separate procedure) Diagnosis Code(s):        --- Professional ---                           D62, Acute posthemorrhagic anemia                           K29.00, Acute gastritis without bleeding                           K92.1, Melena (includes Hematochezia) CPT copyright 2019 American Medical Association. All rights reserved. The codes documented in this report are preliminary and upon coder review may  be revised to meet current compliance requirements. Lear Ng, MD 07/17/2021 12:58:08 PM This report has been signed  electronically. Number of Addenda: 0

## 2021-07-17 NOTE — Plan of Care (Signed)

## 2021-07-17 NOTE — Progress Notes (Signed)
ANTICOAGULATION CONSULT NOTE  Pharmacy Consult for enoxaparin Indication: recent PE, DOAC on hold for rectal bleeding  Allergies  Allergen Reactions   Coconut Flavor [Flavoring Agent] Hives   Coconut Oil Hives   Ibuprofen Other (See Comments)    Made gastric ulcers worse   Aleve [Naproxen] Other (See Comments)    DUE TO KIDNEYS   Nsaids Other (See Comments)    Stomach ulcers    Vascepa [Icosapent Ethyl] Other (See Comments)    headaches, chest pain, similar to sx of a stroke, hypotension     Patient Measurements: Height: _0  (177.8 cm) Weight: (!) 157 kg (346 lb 2 oz) IBW/kg (Calculated) : 73 Heparin Dosing Weight: 111 kg  Vital Signs: Temp: 97.6 F (36.4 C) (09/24 1310) Temp Source: Oral (09/24 1107) BP: 113/57 (09/24 1300) Pulse Rate: 77 (09/24 1310)  Labs: Recent Labs    07/15/21 1236 07/15/21 1611 07/16/21 0539 07/16/21 1550 07/17/21 0346  HGB 11.5*  --  11.2* 11.3* 11.7*  HCT 34.8*  --  34.5* 34.4* 35.9*  PLT 246  --  244  --  261  APTT  --   --  97* 95*  --   HEPARINUNFRC  --   --  >1.10*  --  0.62  CREATININE 0.77  --  0.68  --  0.68  TROPONINIHS  --  8  --   --   --      Estimated Creatinine Clearance: 153.6 mL/min (by C-G formula based on SCr of 0.68 mg/dL).   Medications: Apixaban 5 mg BID PTA -Last dose: 07/15/21 @ 0900  Assessment: 28 yoM with PMH OSA/OHS on 4L O2 at baseline, dCHF, on Eliquis for recent PE diagnosed 9/3, subsequently admitted 9/7-9/10 for GI bleeding requiring Eliquis to be held through 9/11, now presents with recurrent rectal bleeding x 2 days. Apixaban was held on admission and patient was anticoagulated with heparin drip during apixaban washout prior to colonoscopy.   Patient underwent colonoscopy on 9/24 - no bleeding seen, suspect diverticular bleed that resolved per GI notes.   Pharmacy consulted to dose enoxaparin for PE - ok to start tonight per MD.  Today, 07/17/2021: Heparin drip was stopped at 0600 this morning  prior to procedures CBC: Hgb low but stable; Plt WNL & stable  Goal of Therapy: Anti-Xa level 0.6 - 1 units/mL 4 hours post-dose  Plan: Enoxaparin 150 mg (1 mg/kg) subcutaneous q12h Monitor CBC, renal function, and for signs of bleeding Enoxaparin patient education kit ordered  Consider checking steady state anti-Xa level given morbid obesity and BMI of [place], PharmD 07/17/21 1:42 PM

## 2021-07-17 NOTE — Brief Op Note (Signed)
No bleeding or blood products seen on EGD/colonoscopy. Suspect diverticular bleed that has resolved. EGD mild gastritis. Colonoscopy 2 small polyps removed. See endopro note for details. Heart healthy diet. Resume IV Heparin. Resume Eliquis tomorrow. Will f/u on path as outpt. Will sign off. Call if questions.

## 2021-07-17 NOTE — Progress Notes (Signed)
PROGRESS NOTE  ASAD KEEVEN  VZD:[id number] DOB: 1960/11/03 DOA: 07/15/2021 PCP: [name], FNP  Brief Narrative: ZAHI PLASKETT is a 60 y.o. male with a history of OSA not on CPAP, chronic 4L O2-dependence, IDT2DM, HTN, chronic HFpEF, morbid obesity, recent PE now on eliquis, recent admission for GI bleeding while on eliquis (discharged 9/10) who returned to the ED 9/22 with several days of BRBPR and intermittent epistaxis having resumed eliquis the previous week. He was hemodynamically stable in the ED with hgb 11.5g/dl. He was admitted, eliquis held and replaced by IV heparin with serial blood counts. GI was consulted, recommended extended bowel prep and colonoscopy this admission, currently planned 9/24.   Assessment & Plan: Principal Problem:   Acute GI bleeding Active Problems:   Dyslipidemia   Coronary artery disease involving native coronary artery of native heart without angina pectoris   Uncontrolled type 2 diabetes mellitus with hyperglycemia (HCC)   Chronic diastolic CHF (congestive heart failure) (HCC)   OSA (obstructive sleep apnea)   Epistaxis, recurrent   Morbid obesity ([place])   GI bleed   Pulmonary embolism (HCC)  Recurrent lower GI bleeding: Still ongoing but low level. Hemodynamically stable and hgb relatively stable. - GI consulted, planning colonoscopy after extended prep on 9/24. NPO  - Hgb stable at 11.7g/dl this AM with resolution of bleeding. If bleeding becomes brisk/unstable would get stat CTA and DC heparin. - Of course, holding eliquis - Empiric PPI  LLE DVT/PE: Moderate clot burden.  - After discussion with hematology, Dr. Benay Spice, we will plan to continue on IV heparin for now as this can be quickly turned off/reversed and transition to lovenox ideally for anticoagulation as an outpatient. Coumadin is an alternative option. Duration would ideally be 3-6 months.    Dyslipidemia - Restart statin   CAD, chronic HFpEF: No current angina. CHF  compensated. Echo 06/28/21 with LVEF 60-65%, no RWMA.  - Continue home medications including lasix TID, imdur, coreg    T wave inversion in lateral leads: Asymptomatic. No wall motion abnormalities on recent echo. Troponin normal, BNP normal.  - Continue telemetry.     Uncontrolled IDT2DM with hyperglycemia: HbA1c 10.4% on 9/4 - Increase semglee to 20u BID (far, far below home dosing due to difference in po intake). Increase novolog to [facility] and continue SSI. - Appreciate diabetes coordinator involvement.   Chronic hypoxic respiratory failure, COPD, OSA:  - Continue supplemental oxygen with ambulation and nocturnally which is his baseline.  - Continue inhaled bronchodilators. No exacerbation currently.    Epistaxis: Self-limited - Continue monitoring   Hypokalemia: Resolved with supplementation.   Prolonged QT interval: Continues.  - Avoid provocative agents - Supp Mg and K.  Morbid obesity: Estimated body mass index is 49.66 kg/m as calculated from the following:   Height as of this encounter: 5\' 10"  (1.778 m).   Weight as of this encounter: 157 kg.  DVT prophylaxis: IV heparin Code Status: Full Family Communication: None at bedside Disposition Plan:  Status is: Inpatient  Remains inpatient appropriate because:Ongoing diagnostic testing needed not appropriate for outpatient work up and Inpatient level of care appropriate due to severity of illness  Dispo: The patient is from: Home              Anticipated d/c is to: Home              Patient currently is not medically stable to d/c.  Consultants:  [facility], Dr. Michail Sermon Hematology, Dr. Benay Spice  Procedures:  Colonoscopy planned 9/24  Antimicrobials: None   Subjective: Didn't finish all of prep, having yellow/tan stools without blood in past 12 hours. Had brief nose bleed that resolved spontaneously with some pressure. No abd pain.   Objective: Vitals:   07/17/21 0417 07/17/21 0827 07/17/21 0919 07/17/21 1107   BP: (!) 144/77   (!) 188/95  Pulse: 70   76  Resp: 20   18  Temp: 97.9 F (36.6 C)   97.9 F (36.6 C)  TempSrc: Oral   Oral  SpO2: 100% 98% 97% 100%  Weight:    (!) 157 kg  Height:    5\' 10"  (1.778 m)    Intake/Output Summary (Last 24 hours) at 07/17/2021 1251 Last data filed at 07/17/2021 0300 Gross per 24 hour  Intake 434.93 ml  Output --  Net 434.93 ml   Filed Weights   07/15/21 2000 07/17/21 1107  Weight: (!) 157.4 kg (!) 157 kg   Gen: 60 y.o. male in no distress HEENT: Right nare with dried blood, no active bleeding or lesions noted. Pulm: Nonlabored breathing room air. Clear. CV: Regular rate and rhythm. No murmur, rub, or gallop. No JVD, no pitting dependent edema. GI: Abdomen soft, non-tender, non-distended, with normoactive bowel sounds.  Ext: Warm, no deformities Skin: No rashes, lesions or ulcers on visualized skin. Neuro: Alert and oriented. No focal neurological deficits. Psych: Judgement and insight appear fair. Mood euthymic & affect congruent. Behavior is appropriate.    Data Reviewed: I have personally reviewed following labs and imaging studies  CBC: Recent Labs  Lab 07/15/21 1236 07/16/21 0539 07/16/21 1550 07/17/21 0346  WBC 8.5 9.3  --  8.5  HGB 11.5* 11.2* 11.3* 11.7*  HCT 34.8* 34.5* 34.4* 35.9*  MCV 95.6 94.8  --  94.7  PLT 246 244  --  539   Basic Metabolic Panel: Recent Labs  Lab 07/15/21 1236 07/16/21 0539 07/17/21 0346  NA 141 137 134*  K 3.4* 3.5 3.7  CL 102 100 98  CO2 28 25 27   GLUCOSE 228* 204* 223*  BUN 15 11 8   CREATININE 0.77 0.68 0.68  CALCIUM 9.4 9.0 8.9  MG  --   --  2.1   GFR: Estimated Creatinine Clearance: 153.6 mL/min (by C-G formula based on SCr of 0.68 mg/dL). Liver Function Tests: Recent Labs  Lab 07/15/21 1236  AST 25  ALT 26  ALKPHOS 45  BILITOT 0.8  PROT 7.3  ALBUMIN 3.7   No results for input(s): LIPASE, AMYLASE in the last 168 hours. No results for input(s): AMMONIA in the last 168  hours. Coagulation Profile: No results for input(s): INR, PROTIME in the last 168 hours. Cardiac Enzymes: No results for input(s): CKTOTAL, CKMB, CKMBINDEX, TROPONINI in the last 168 hours. BNP (last 3 results) No results for input(s): PROBNP in the last 8760 hours. HbA1C: Recent Labs    07/15/21 1611  HGBA1C 9.6*   CBG: Recent Labs  Lab 07/16/21 2038 07/17/21 0008 07/17/21 0414 07/17/21 0800 07/17/21 1117  GLUCAP 194* 185* 212* 192* 202*   Lipid Profile: No results for input(s): CHOL, HDL, LDLCALC, TRIG, CHOLHDL, LDLDIRECT in the last 72 hours. Thyroid Function Tests: No results for input(s): TSH, T4TOTAL, FREET4, T3FREE, THYROIDAB in the last 72 hours. Anemia Panel: No results for input(s): VITAMINB12, FOLATE, FERRITIN, TIBC, IRON, RETICCTPCT in the last 72 hours. Urine analysis:    Component Value Date/Time   COLORURINE COLORLESS (A) 06/05/2021 [place] 06/05/2021 1422  LABSPEC 1.005 06/05/2021 1422   PHURINE 7.0 06/05/2021 1422   GLUCOSEU 150 (A) 06/05/2021 1422   HGBUR SMALL (A) 06/05/2021 1422   BILIRUBINUR NEGATIVE 06/05/2021 1422   KETONESUR NEGATIVE 06/05/2021 1422   PROTEINUR NEGATIVE 06/05/2021 1422   UROBILINOGEN 0.2 05/27/2014 2312   NITRITE NEGATIVE 06/05/2021 1422   LEUKOCYTESUR NEGATIVE 06/05/2021 1422   No results found for this or any previous visit (from the past 240 hour(s)).    Radiology Studies: DG Chest Port 1 View  Result Date: 07/15/2021 CLINICAL DATA:  Intermittent nosebleeds. EXAM: PORTABLE CHEST 1 VIEW COMPARISON:  06/30/2021 FINDINGS: The cardiac silhouette, mediastinal and hilar contours are within normal limits and stable. The lungs are clear. No pleural effusions. The bony thorax is intact. IMPRESSION: No acute cardiopulmonary findings. Electronically Signed   By: Marijo Sanes M.D.   On: 07/15/2021 13:41    Scheduled Meds:  [MAR Hold] carvedilol  12.5 mg Oral BID   [MAR Hold] furosemide  40 mg Oral TID WC   [MAR  Hold] hydrocerin   Topical BID   [MAR Hold] insulin aspart  0-15 Units Subcutaneous TID WC   [MAR Hold] insulin aspart  0-5 Units Subcutaneous QHS   [MAR Hold] insulin aspart  7 Units Subcutaneous TID WC   [MAR Hold] insulin glargine-yfgn  20 Units Subcutaneous BID   [MAR Hold] ipratropium-albuterol  3 mL Nebulization BID   [MAR Hold] isosorbide mononitrate  30 mg Oral Daily   [MAR Hold] pantoprazole (PROTONIX) IV  40 mg Intravenous Q12H   [MAR Hold] potassium chloride  40 mEq Oral Daily   [MAR Hold] sodium chloride flush  3 mL Intravenous Q12H   Continuous Infusions:  [MAR Hold] sodium chloride     sodium chloride 20 mL/hr at 07/17/21 0514   lactated ringers       LOS: 2 days   Time spent: 25 minutes.  Patrecia Pour, MD [facility] [email or web address] 07/17/2021, 12:51 PM

## 2021-07-17 NOTE — Interval H&P Note (Signed)
History and Physical Interval Note:  07/17/2021 11:51 AM  [name]  has presented today for surgery, with the diagnosis of Hematochezia.  The various methods of treatment have been discussed with the patient and family. After consideration of risks, benefits and other options for treatment, the patient has consented to  Procedure(s): COLONOSCOPY (N/A) as a surgical intervention.  The patient's history has been reviewed, patient examined, no change in status, stable for surgery.  I have reviewed the patient's chart and labs.  Questions were answered to the patient's satisfaction.     Lear Ng

## 2021-07-17 NOTE — Anesthesia Procedure Notes (Addendum)
Procedure Name: MAC Date/Time: 07/17/2021 11:57 AM Performed by: Lissa Morales, CRNA Pre-anesthesia Checklist: Emergency Drugs available, Patient identified, Suction available and Patient being monitored Patient Re-evaluated:Patient Re-evaluated prior to induction Oxygen Delivery Method: Simple face mask Preoxygenation: Pre-oxygenation with 100% oxygen (POM mask) Placement Confirmation: positive ETCO2

## 2021-07-17 NOTE — Transfer of Care (Signed)
Immediate Anesthesia Transfer of Care Note  Patient: [name]  Procedure(s) Performed: COLONOSCOPY ESOPHAGOGASTRODUODENOSCOPY (EGD) WITH PROPOFOL POLYPECTOMY  Patient Location: PACU  Anesthesia Type:MAC  Level of Consciousness: awake, alert , oriented and patient cooperative  Airway & Oxygen Therapy: Patient Spontanous Breathing and Patient connected to face mask oxygen  Post-op Assessment: Report given to RN, Post -op Vital signs reviewed and stable and Patient moving all extremities X 4  Post vital signs: stable  Last Vitals:  Vitals Value Taken Time  BP 113/57 07/17/21 1300  Temp 36.4 C 07/17/21 1255  Pulse 72 07/17/21 1310  Resp 22 07/17/21 1310  SpO2 97 % 07/17/21 1310  Vitals shown include unvalidated device data.  Last Pain:  Vitals:   07/17/21 1255  TempSrc:   PainSc: 7       Patients Stated Pain Goal: 1 (70/05/25 9102)  Complications: No notable events documented.

## 2021-07-18 DIAGNOSIS — K922 Gastrointestinal hemorrhage, unspecified: Secondary | ICD-10-CM | POA: Diagnosis not present

## 2021-07-18 LAB — CBC
HCT: 35.7 % — ABNORMAL LOW (ref 39.0–52.0)
Hemoglobin: 11.8 g/dL — ABNORMAL LOW (ref 13.0–17.0)
MCH: 31.3 pg (ref 26.0–34.0)
MCHC: 33.1 g/dL (ref 30.0–36.0)
MCV: 94.7 fL (ref 80.0–100.0)
Platelets: 277 10*3/uL (ref 150–400)
RBC: 3.77 MIL/uL — ABNORMAL LOW (ref 4.22–5.81)
RDW: 13.6 % (ref 11.5–15.5)
WBC: 8.8 10*3/uL (ref 4.0–10.5)
nRBC: 0 % (ref 0.0–0.2)

## 2021-07-18 LAB — GLUCOSE, CAPILLARY
Glucose-Capillary: 232 mg/dL — ABNORMAL HIGH (ref 70–99)
Glucose-Capillary: 232 mg/dL — ABNORMAL HIGH (ref 70–99)
Glucose-Capillary: 232 mg/dL — ABNORMAL HIGH (ref 70–99)
Glucose-Capillary: 246 mg/dL — ABNORMAL HIGH (ref 70–99)
Glucose-Capillary: 260 mg/dL — ABNORMAL HIGH (ref 70–99)
Glucose-Capillary: 277 mg/dL — ABNORMAL HIGH (ref 70–99)

## 2021-07-18 MED ORDER — ATORVASTATIN CALCIUM 40 MG PO TABS: 40.0000 mg | ORAL_TABLET | Freq: Every day | ORAL | Status: DC

## 2021-07-18 NOTE — Plan of Care (Signed)
  Problem: Education: Goal: Knowledge of General Education information will improve Description: Including pain rating scale, medication(s)/side effects and non-pharmacologic comfort measures Outcome: Completed/Met   Problem: Health Behavior/Discharge Planning: Goal: Ability to manage health-related needs will improve Outcome: Completed/Met   Problem: Clinical Measurements: Goal: Ability to maintain clinical measurements within normal limits will improve Outcome: Completed/Met Goal: Will remain free from infection Outcome: Completed/Met Goal: Diagnostic test results will improve Outcome: Completed/Met Goal: Respiratory complications will improve Outcome: Completed/Met Goal: Cardiovascular complication will be avoided Outcome: Completed/Met   Problem: Activity: Goal: Risk for activity intolerance will decrease Outcome: Completed/Met   Problem: Nutrition: Goal: Adequate nutrition will be maintained Outcome: Completed/Met   

## 2021-07-18 NOTE — Progress Notes (Signed)
Rn removed pt IV. RN reviewed discharge paperwork w/ pt who stated understanding. Pt was in possession of all personal belongings. RN transported pt via wheelchair to the main entrance where his brother was waiting. Pt safely entered vehicle.

## 2021-07-18 NOTE — Discharge Summary (Signed)
Physician Discharge Summary  [name] YIF:[id number] DOB: 06/02/1961 DOA: 07/15/2021  PCP: [name], FNP  Admit date: 07/15/2021 Discharge date: 07/18/2021  Admitted From: Home Disposition: Home  Recommendations for Outpatient Follow-up:  Follow up with PCP in 1-2 weeks Please obtain BMP/CBC in one week Please follow up with Dr. Benay Spice as scheduled  Discharge Condition: Stable CODE STATUS: Full Diet recommendation: Low-salt low-fat diabetic diet as tolerated  Brief/Interim Summary: [name] is a 60 y.o. male with a history of OSA not on CPAP, chronic 4L O2-dependence, IDT2DM, HTN, chronic HFpEF, morbid obesity, recent PE now on eliquis, recent admission for GI bleeding while on eliquis (discharged 9/10) who returned to the ED 9/22 with several days of BRBPR and intermittent epistaxis having resumed eliquis the previous week. He was hemodynamically stable in the ED with hgb 11.5g/dl. He was admitted, eliquis held and replaced by IV heparin with serial blood counts. GI was consulted.    Recurrent lower GI bleeding: - GI consulted, repeat endoscopy unremarkable for signs of bleeding, hemoglobin stable -Patient refusing to transition to Lovenox or Coumadin given our discussion today, does not want to have to inject himself more than he already is with his insulin, does not want to follow for Coumadin clinic and INR checks.  Recommended close follow-up in the outpatient setting with Dr. Oncology given negative endoscopy and transient episodes of bleeding lengthy discussion with patient about any anticoagulation will increase his risk of bleeding.   LLE DVT/PE: Moderate clot burden.  -Follow-up with Dr. Benay Spice, initial plan for transition to Lovenox versus Lovenox bridge to Coumadin has been canceled today as patient does not want to change any medications, does not want to inject himself with a new medication nor is he willing to comply with Coumadin diet or INR checks  outpatient.  Continue Eliquis with outpatient follow-up this week with oncology for further discussion.    Dyslipidemia -Continue statin, lengthy discussion about need for dietary and lifestyle changes   CAD, chronic HFpEF:  - No current angina. CHF compensated. Echo 06/28/21 with LVEF 60-65%, no RWMA.  - Continue home medications -no changes during hospitalization    Uncontrolled IDT2DM with hyperglycemia:  - HbA1c 10.4% on 9/4 -patient admits to being noncompliant with home medications, as above does not prefer to inject medications including insulin -Recommended strict dietary changes close follow-up with PCP later this week as scheduled   Chronic hypoxic respiratory failure, COPD, OSA:  - Continue supplemental oxygen with ambulation and nocturnally which is his baseline.  - Continue inhaled bronchodilators. No exacerbation currently.    Epistaxis: Self-limited - Continue monitoring   Hypokalemia: Resolved with supplementation.    Prolonged QT interval: chronic - stable - Avoid provocative agents   Morbid obesity: Estimated body mass index is 49.66 kg/m as calculated from the following:   Height as of this encounter: 5\' 10"  (1.778 m).   Weight as of this encounter: 157 kg.  Discharge Instructions  Discharge Instructions     Diet - low sodium heart healthy   Complete by: As directed    Increase activity slowly   Complete by: As directed       Allergies as of 07/18/2021       Reactions   Coconut Flavor [flavoring Agent] Hives   Coconut Oil Hives   Ibuprofen Other (See Comments)   Made gastric ulcers worse   Aleve [naproxen] Other (See Comments)   DUE TO KIDNEYS   Nsaids Other (See Comments)  Stomach ulcers   Vascepa [icosapent Ethyl] Other (See Comments)   headaches, chest pain, similar to sx of a stroke, hypotension         Medication List     TAKE these medications    Accu-Chek FastClix Lancets Misc 1 Device by Other route 2 (two) times daily.    Accu-Chek Guide test strip Generic drug: glucose blood 1 each by Other route 2 (two) times daily.   albuterol 108 (90 Base) MCG/ACT inhaler Commonly known as: VENTOLIN HFA Inhale 2 puffs into the lungs every 6 (six) hours as needed for wheezing or shortness of breath. As Directed   apixaban 5 MG Tabs tablet Commonly known as: Eliquis Take 2 tablets (10mg ) twice daily for 7 days, then 1 tablet (5mg ) twice daily What changed:  how much to take how to take this when to take this additional instructions   atorvastatin 40 MG tablet Commonly known as: LIPITOR Take 40 mg by mouth at bedtime.   carvedilol 12.5 MG tablet Commonly known as: COREG Take 12.5 mg by mouth 2 (two) times daily.   famotidine 20 MG tablet Commonly known as: PEPCID Take 20 mg by mouth daily.   fenofibrate 145 MG tablet Commonly known as: TRICOR Take 1 tablet (145 mg total) by mouth daily.   furosemide 40 MG tablet Commonly known as: Lasix Take 1 tablet (40 mg total) by mouth daily. PLEASE RESUME ONLY FROM 10/21/2020. DOSE HAS BEEN CHANGED. What changed:  when to take this additional instructions   glipiZIDE 10 MG tablet Commonly known as: GLUCOTROL Take 10 mg by mouth 2 (two) times daily.   HumuLIN R U-500 KwikPen 500 UNIT/ML KwikPen Generic drug: insulin regular human CONCENTRATED Inject 80-90 Units into the skin in the morning, at noon, and at bedtime. Per sliding scale   Insulin Pen Needle 31G X 5 MM Misc Use 1 needle daily to inject insulin as prescribed What changed: how much to take   ipratropium-albuterol 0.5-2.5 (3) MG/3ML Soln Commonly known as: DUONEB Take 3 mLs by nebulization 2 (two) times daily.   isosorbide mononitrate 30 MG 24 hr tablet Commonly known as: IMDUR TAKE 1 TABLET BY MOUTH DAILY   lisinopril 5 MG tablet Commonly known as: ZESTRIL Take 5 mg by mouth daily.   nitroGLYCERIN 0.4 MG SL tablet Commonly known as: NITROSTAT Place 1 tablet (0.4 mg total) under the  tongue every 5 (five) minutes as needed for chest pain.   ondansetron 4 MG tablet Commonly known as: ZOFRAN Take 4 mg by mouth 2 (two) times daily as needed for nausea/vomiting.   Oxycodone HCl 20 MG Tabs Take 20 mg by mouth 4 (four) times daily as needed (pain).   pantoprazole 40 MG tablet Commonly known as: PROTONIX Take 40 mg by mouth daily.   potassium chloride 10 MEQ CR capsule Commonly known as: MICRO-K Take 20 mEq by mouth daily.   sertraline 50 MG tablet Commonly known as: ZOLOFT Take 50 mg by mouth daily.   tiZANidine 4 MG tablet Commonly known as: ZANAFLEX Take 4 mg by mouth at bedtime as needed for muscle spasms.   traZODone 50 MG tablet Commonly known as: DESYREL Take 1 tablet (50 mg total) by mouth at bedtime.   Vitamin D (Ergocalciferol) 1.25 MG (50000 UNIT) Caps capsule Commonly known as: DRISDOL Take 50,000 Units by mouth every Monday.        Allergies  Allergen Reactions   Coconut Flavor [Flavoring Agent] Hives   Coconut Oil Hives  Ibuprofen Other (See Comments)    Made gastric ulcers worse   Aleve [Naproxen] Other (See Comments)    DUE TO KIDNEYS   Nsaids Other (See Comments)    Stomach ulcers    Vascepa [Icosapent Ethyl] Other (See Comments)    headaches, chest pain, similar to sx of a stroke, hypotension     Consultations: Oncology, GI  Procedures/Studies: DG Chest 2 View  Result Date: 06/26/2021 CLINICAL DATA:  Chest pain and shortness of breath EXAM: CHEST - 2 VIEW COMPARISON:  06/15/2021 FINDINGS: Stable cardiomegaly with vascular congestion. Minor basilar atelectasis. Negative for pneumonia, CHF, effusion or pneumothorax. Trachea midline. Degenerative changes of the spine. Lateral view is limited with motion artifact. No severe compression fracture. IMPRESSION: Stable chest exam. No interval change or acute process by plain radiography. Electronically Signed   By: Jerilynn Mages.  Shick M.D.   On: 06/26/2021 12:19   CT Angio Chest PE W and/or Wo  Contrast  Result Date: 06/26/2021 CLINICAL DATA:  Shortness of breath. High probability for pulmonary embolus. EXAM: CT ANGIOGRAPHY CHEST WITH CONTRAST TECHNIQUE: Multidetector CT imaging of the chest was performed using the standard protocol during bolus administration of intravenous contrast. Multiplanar CT image reconstructions and MIPs were obtained to evaluate the vascular anatomy. CONTRAST:  68mL OMNIPAQUE IOHEXOL 350 MG/ML SOLN COMPARISON:  Radiograph earlier today.  Chest CTA 07/31/2019 FINDINGS: Cardiovascular: Positive for pulmonary embolus with filling defects involving the distal right main pulmonary artery extending into the upper, middle, and lower lobar branches. Thrombus is subtotally occlusive. Segmental filling defects in the left lower lobe pulmonary artery, and subsegmental left upper lobe pulmonary arterial filling defects. There is dilatation of the main pulmonary artery at 4.8 cm, chronic. Elevated RV to LV ratio of 1.08. Heart size upper normal. There is no pericardial effusion. Coronary artery calcification or stent. No aortic dissection. Mediastinum/Nodes: No enlarged mediastinal or hilar lymph nodes. Patulous esophagus without wall thickening. No thyroid nodule. Lungs/Pleura: Patchy ground-glass opacity in the right upper lobe, slightly perifissural but not peripheral or wedge-shaped. Minor dependent atelectasis in the lower lobes. No pleural fluid. No pulmonary mass. Upper Abdomen: Enlarged liver with steatosis. No acute upper abdominal findings. Musculoskeletal: Diffuse thoracic spondylosis with endplate spurring and degenerative disc disease. No acute osseous abnormalities are seen. Review of the MIP images confirms the above findings. IMPRESSION: 1. Positive for pulmonary embolus with moderate clot burden and evidence of right heart strain, RV to LV ratio of 1.08. 2. Patchy ground-glass opacity in the right upper lobe, slightly perifissural but not peripheral or wedge-shaped. Favor  pneumonia over pulmonary infarct. 3. Chronic dilatation of the main pulmonary artery consistent with pulmonary arterial hypertension. 4. Coronary artery calcification or stent. 5. Incidental hepatomegaly and steatosis in the upper abdomen. Critical Value/emergent results were called by telephone at the time of interpretation on 06/26/2021 at 6:58 pm to provider [facility] , who verbally acknowledged these results. Electronically Signed   By: Keith Rake M.D.   On: 06/26/2021 18:58   DG Chest Port 1 View  Result Date: 07/15/2021 CLINICAL DATA:  Intermittent nosebleeds. EXAM: PORTABLE CHEST 1 VIEW COMPARISON:  06/30/2021 FINDINGS: The cardiac silhouette, mediastinal and hilar contours are within normal limits and stable. The lungs are clear. No pleural effusions. The bony thorax is intact. IMPRESSION: No acute cardiopulmonary findings. Electronically Signed   By: Marijo Sanes M.D.   On: 07/15/2021 13:41   DG Chest Portable 1 View  Result Date: 06/30/2021 CLINICAL DATA:  Shortness of breath. EXAM: PORTABLE  CHEST 1 VIEW COMPARISON:  Chest x-ray 06/26/2021.  CT chest 06/26/2021. FINDINGS: The heart is mildly enlarged. There is central pulmonary vascular congestion. There is no focal lung consolidation, pleural effusion or pneumothorax. No acute fractures are seen. IMPRESSION: 1. Cardiomegaly with central pulmonary vascular congestion. Electronically Signed   By: Ronney Asters M.D.   On: 06/30/2021 16:56   VAS [place] LOWER EXTREMITY VENOUS (DVT)  Result Date: 06/28/2021  Lower Venous DVT Study Patient Name:  KINAN SAFLEY  Date of Exam:   06/28/2021 Medical Rec #: [id number]         Accession #:    [id number] Date of Birth: 1961/06/06        Patient Gender: M Patient Age:   21 years Exam Location:  [facility] Procedure:      VAS [place] LOWER EXTREMITY VENOUS (DVT) Referring Phys: Jennette Kettle --------------------------------------------------------------------------------  Indications: Pulmonary  embolism.  Risk Factors: COVID 19 positive. Limitations: Body habitus, poor ultrasound/tissue interface and calf skin induration. Comparison Study: No prior studies. Performing Technologist: Oliver Hum RVT  Examination Guidelines: A complete evaluation includes B-mode imaging, spectral Doppler, color Doppler, and power Doppler as needed of all accessible portions of each vessel. Bilateral testing is considered an integral part of a complete examination. Limited examinations for reoccurring indications may be performed as noted. The reflux portion of the exam is performed with the patient in reverse Trendelenburg.  +---------+---------------+---------+-----------+----------+-------------------+ RIGHT    CompressibilityPhasicitySpontaneityPropertiesThrombus Aging      +---------+---------------+---------+-----------+----------+-------------------+ CFV      Full           Yes      Yes                                      +---------+---------------+---------+-----------+----------+-------------------+ SFJ      Full                                                             +---------+---------------+---------+-----------+----------+-------------------+ FV Prox  Full                                                             +---------+---------------+---------+-----------+----------+-------------------+ FV Mid   Full                                                             +---------+---------------+---------+-----------+----------+-------------------+ FV DistalFull                                                             +---------+---------------+---------+-----------+----------+-------------------+ PFV      Full                                                             +---------+---------------+---------+-----------+----------+-------------------+  POP      Full           Yes      Yes                                       +---------+---------------+---------+-----------+----------+-------------------+ PTV                                                   Not well visualized +---------+---------------+---------+-----------+----------+-------------------+ PERO                                                  Not well visualized +---------+---------------+---------+-----------+----------+-------------------+   +---------+---------------+---------+-----------+----------+-------------------+ LEFT     CompressibilityPhasicitySpontaneityPropertiesThrombus Aging      +---------+---------------+---------+-----------+----------+-------------------+ CFV      Full           Yes      Yes                                      +---------+---------------+---------+-----------+----------+-------------------+ SFJ      Full                                                             +---------+---------------+---------+-----------+----------+-------------------+ FV Prox  Full                                                             +---------+---------------+---------+-----------+----------+-------------------+ FV Mid   Full                                                             +---------+---------------+---------+-----------+----------+-------------------+ FV DistalFull                                                             +---------+---------------+---------+-----------+----------+-------------------+ PFV      Full                                                             +---------+---------------+---------+-----------+----------+-------------------+ POP      Full           Yes  Yes                                      +---------+---------------+---------+-----------+----------+-------------------+ PTV                                                   Not well visualized +---------+---------------+---------+-----------+----------+-------------------+  PERO                                                  Not well visualized +---------+---------------+---------+-----------+----------+-------------------+     Summary: RIGHT: - There is no evidence of deep vein thrombosis in the lower extremity. However, portions of this examination were limited- see technologist comments above.  - No cystic structure found in the popliteal fossa.  LEFT: - There is no evidence of deep vein thrombosis in the lower extremity. However, portions of this examination were limited- see technologist comments above.  - No cystic structure found in the popliteal fossa.  *See table(s) above for measurements and observations. Electronically signed by Deitra Mayo MD on 06/28/2021 at 3:16:58 PM.    Final    ECHOCARDIOGRAM LIMITED  Result Date: 06/28/2021    ECHOCARDIOGRAM LIMITED REPORT   Patient Name:   BOHDI LEEDS Date of Exam: 06/28/2021 Medical Rec #:  [id number]        Height:       70.0 in Accession #:    [id number]       Weight:       343.9 lb Date of Birth:  [birth year]       BSA:          2.628 m Patient Age:    24 years         BP:           133/87 mmHg Patient Gender: M                HR:           92 bpm. Exam Location:  Inpatient Procedure: Limited Echo, Cardiac Doppler, Color Doppler and Intracardiac            Opacification Agent Indications:    Pulmonary embolus  History:        Patient has prior history of Echocardiogram examinations. CHF,                 CAD; Risk Factors:Diabetes, Dyslipidemia and Hypertension. H/O                 stroke. COVID-19.  Sonographer:    Clayton Lefort RDCS (AE) Referring Phys: [phone] JARED M GARDNER  Sonographer Comments: Technically difficult study due to poor echo windows, suboptimal apical window and patient is morbidly obese. Image acquisition challenging due to patient body habitus. COVID-19. IMPRESSIONS  1. Left ventricular ejection fraction, by estimation, is 60 to 65%. The left ventricle has normal function. The left ventricle  has no regional wall motion abnormalities. There is moderate left ventricular hypertrophy.  2. Right ventricular systolic function is normal. The right ventricular size is normal.  3. The mitral valve is normal in structure. No evidence of mitral valve regurgitation. No evidence of  mitral stenosis.  4. The aortic valve is normal in structure. Aortic valve regurgitation is not visualized. No aortic stenosis is present.  5. The inferior vena cava is dilated in size with >50% respiratory variability, suggesting right atrial pressure of 8 mmHg. FINDINGS  Left Ventricle: Left ventricular ejection fraction, by estimation, is 60 to 65%. The left ventricle has normal function. The left ventricle has no regional wall motion abnormalities. Definity contrast agent was given IV to delineate the left ventricular  endocardial borders. The left ventricular internal cavity size was normal in size. There is moderate left ventricular hypertrophy. Right Ventricle: The right ventricular size is normal. No increase in right ventricular wall thickness. Right ventricular systolic function is normal. Left Atrium: Left atrial size was normal in size. Right Atrium: Right atrial size was normal in size. Pericardium: There is no evidence of pericardial effusion. Mitral Valve: The mitral valve is normal in structure. No evidence of mitral valve stenosis. Tricuspid Valve: The tricuspid valve is normal in structure. Tricuspid valve regurgitation is not demonstrated. No evidence of tricuspid stenosis. Aortic Valve: The aortic valve is normal in structure. Aortic valve regurgitation is not visualized. No aortic stenosis is present. Pulmonic Valve: The pulmonic valve was normal in structure. Pulmonic valve regurgitation is not visualized. No evidence of pulmonic stenosis. Aorta: The aortic root is normal in size and structure. Venous: The inferior vena cava is dilated in size with greater than 50% respiratory variability, suggesting right atrial  pressure of 8 mmHg. IAS/Shunts: No atrial level shunt detected by color flow Doppler. LEFT VENTRICLE PLAX 2D LVIDd:         4.10 cm  Diastology LVIDs:         3.00 cm  LV e' medial:    8.05 cm/s LV PW:         1.60 cm  LV E/e' medial:  8.1 LV IVS:        1.60 cm  LV e' lateral:   8.70 cm/s LVOT diam:     2.10 cm  LV E/e' lateral: 7.5 LVOT Area:     3.46 cm  IVC IVC diam: 2.90 cm LEFT ATRIUM         Index LA diam:    2.30 cm 0.88 cm/m   AORTA Ao Root diam: 3.60 cm Ao Asc diam:  3.70 cm MITRAL VALVE MV Area (PHT): 3.99 cm    SHUNTS MV Decel Time: 190 msec    Systemic Diam: 2.10 cm MV E velocity: 65.40 cm/s MV A velocity: 65.40 cm/s MV E/A ratio:  1.00 Jenkins Rouge MD Electronically signed by Jenkins Rouge MD Signature Date/Time: 06/28/2021/12:52:55 PM    Final      Subjective: No acute issues or events overnight denies nausea vomiting diarrhea constipation headache fevers chills chest pain shortness of breath bright red blood per rectum or melena   Discharge Exam: Vitals:   07/17/21 2021 07/18/21 0448  BP:  133/74  Pulse:  70  Resp:  16  Temp:  98.3 F (36.8 C)  SpO2: 92% 91%   Vitals:   07/17/21 1310 07/17/21 1957 07/17/21 2021 07/18/21 0448  BP:  (!) 152/72  133/74  Pulse: 77 83  70  Resp: (!) 22 18  16   Temp: 97.6 F (36.4 C) 98.4 F (36.9 C)  98.3 F (36.8 C)  TempSrc:  Oral  Oral  SpO2: 92% 96% 92% 91%  Weight:      Height:        General: Pt  is alert, awake, not in acute distress Cardiovascular: RRR, S1/S2 +, no rubs, no gallops Respiratory: CTA bilaterally, no wheezing, no rhonchi Abdominal: Soft, NT, ND, bowel sounds + Extremities: no edema, no cyanosis    The results of significant diagnostics from this hospitalization (including imaging, microbiology, ancillary and laboratory) are listed below for reference.     Microbiology: No results found for this or any previous visit (from the past 240 hour(s)).   Labs: BNP (last 3 results) Recent Labs    06/15/21 1102  06/26/21 1140 07/15/21 1611  BNP 13.3 55.4 86.5   Basic Metabolic Panel: Recent Labs  Lab 07/15/21 1236 07/16/21 0539 07/17/21 0346  NA 141 137 134*  K 3.4* 3.5 3.7  CL 102 100 98  CO2 28 25 27   GLUCOSE 228* 204* 223*  BUN 15 11 8   CREATININE 0.77 0.68 0.68  CALCIUM 9.4 9.0 8.9  MG  --   --  2.1   Liver Function Tests: Recent Labs  Lab 07/15/21 1236  AST 25  ALT 26  ALKPHOS 45  BILITOT 0.8  PROT 7.3  ALBUMIN 3.7   No results for input(s): LIPASE, AMYLASE in the last 168 hours. No results for input(s): AMMONIA in the last 168 hours. CBC: Recent Labs  Lab 07/15/21 1236 07/16/21 0539 07/16/21 1550 07/17/21 0346 07/18/21 0336  WBC 8.5 9.3  --  8.5 8.8  HGB 11.5* 11.2* 11.3* 11.7* 11.8*  HCT 34.8* 34.5* 34.4* 35.9* 35.7*  MCV 95.6 94.8  --  94.7 94.7  PLT 246 244  --  261 277   Cardiac Enzymes: No results for input(s): CKTOTAL, CKMB, CKMBINDEX, TROPONINI in the last 168 hours. BNP: Invalid input(s): POCBNP CBG: Recent Labs  Lab 07/18/21 0018 07/18/21 0447 07/18/21 0731 07/18/21 0914 07/18/21 1105  GLUCAP 277* 246* 232* 232* 232*   D-Dimer No results for input(s): DDIMER in the last 72 hours. Hgb A1c Recent Labs    07/15/21 1611  HGBA1C 9.6*   Lipid Profile No results for input(s): CHOL, HDL, LDLCALC, TRIG, CHOLHDL, LDLDIRECT in the last 72 hours. Thyroid function studies No results for input(s): TSH, T4TOTAL, T3FREE, THYROIDAB in the last 72 hours.  Invalid input(s): FREET3 Anemia work up No results for input(s): VITAMINB12, FOLATE, FERRITIN, TIBC, IRON, RETICCTPCT in the last 72 hours. Urinalysis    Component Value Date/Time   COLORURINE COLORLESS (A) 06/05/2021 1422   APPEARANCEUR CLEAR 06/05/2021 1422   LABSPEC 1.005 06/05/2021 1422   PHURINE 7.0 06/05/2021 1422   GLUCOSEU 150 (A) 06/05/2021 1422   HGBUR SMALL (A) 06/05/2021 1422   BILIRUBINUR NEGATIVE 06/05/2021 1422   KETONESUR NEGATIVE 06/05/2021 1422   PROTEINUR NEGATIVE  06/05/2021 1422   UROBILINOGEN 0.2 05/27/2014 2312   NITRITE NEGATIVE 06/05/2021 1422   LEUKOCYTESUR NEGATIVE 06/05/2021 1422   Sepsis Labs Invalid input(s): PROCALCITONIN,  WBC,  LACTICIDVEN Microbiology No results found for this or any previous visit (from the past 240 hour(s)).   Time coordinating discharge: Over 30 minutes  SIGNED:   Little Ishikawa, DO [facility] 07/18/2021, 12:59 PM Pager   If 7PM-7AM, please contact night-coverage [email or web address]

## 2021-07-19 ENCOUNTER — Encounter (HOSPITAL_COMMUNITY): Payer: Self-pay | Admitting: Gastroenterology

## 2021-07-20 LAB — SURGICAL PATHOLOGY

## 2021-07-23 ENCOUNTER — Telehealth: Payer: Self-pay | Admitting: *Deleted

## 2021-07-23 NOTE — Telephone Encounter (Signed)
Call from PCP office to request referral to Dr. Benay Spice from hospital visit for acute PE after H/O DVT. Currently on Eliquis. Provided caller the fax and phone # for referral coordinator at [facility] since this patient saw Dr. Alen Blew last in 2019.

## 2021-07-27 ENCOUNTER — Telehealth: Payer: Self-pay | Admitting: Oncology

## 2021-07-27 NOTE — Telephone Encounter (Signed)
Scheduled appt per 10/3 referral. Pt is aware of appt date and time.

## 2021-07-28 ENCOUNTER — Other Ambulatory Visit: Payer: Self-pay

## 2021-07-28 ENCOUNTER — Ambulatory Visit (INDEPENDENT_AMBULATORY_CARE_PROVIDER_SITE_OTHER): Payer: Medicaid Other | Admitting: Podiatry

## 2021-07-28 ENCOUNTER — Encounter: Payer: Self-pay | Admitting: Podiatry

## 2021-07-28 DIAGNOSIS — B351 Tinea unguium: Secondary | ICD-10-CM | POA: Diagnosis not present

## 2021-07-28 DIAGNOSIS — M79674 Pain in right toe(s): Secondary | ICD-10-CM

## 2021-07-28 DIAGNOSIS — E1142 Type 2 diabetes mellitus with diabetic polyneuropathy: Secondary | ICD-10-CM

## 2021-07-28 DIAGNOSIS — M79675 Pain in left toe(s): Secondary | ICD-10-CM | POA: Diagnosis not present

## 2021-07-28 DIAGNOSIS — N179 Acute kidney failure, unspecified: Secondary | ICD-10-CM | POA: Diagnosis not present

## 2021-07-28 NOTE — Progress Notes (Signed)
This patient presents  to my office for at risk foot care.  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  This patient presents for at risk foot care today.  General Appearance  Alert, conversant and in no acute stress.  Vascular  Dorsalis pedis   pulses are absent bilaterally. Posterior tibial pulses are absent due to swelling. Capillary return is within normal limits  bilaterally. Temperature is within normal limits  bilaterally.  Venous stasis  B/L.  Neurologic  Senn-Weinstein monofilament wire test within normal limits  bilaterally. Muscle power within normal limits bilaterally.  Nails Thick disfigured discolored nails with subungual debris  from hallux to fifth toes bilaterally. No evidence of bacterial infection or drainage bilaterally.  Orthopedic  No limitations of motion  feet .  No crepitus or effusions noted.  No bony pathology or digital deformities noted.  Skin  dry skin with no porokeratosis noted bilaterally.  No signs of infections or ulcers noted.     Onychomycosis  Pain in right toes  Pain in left toes  Consent was obtained for treatment procedures.   Mechanical debridement of nails 1-5  bilaterally performed with a nail nipper.  Filed with dremel without incident.    Return office visit    3 months                 Told patient to return for periodic foot care and evaluation due to potential at risk complications.   Gardiner Barefoot DPM

## 2021-07-30 ENCOUNTER — Other Ambulatory Visit: Payer: Self-pay

## 2021-07-30 ENCOUNTER — Inpatient Hospital Stay: Payer: Medicaid Other | Attending: Oncology | Admitting: Oncology

## 2021-07-30 VITALS — BP 140/70 | HR 100 | Temp 98.9°F | Resp 20 | Wt 339.7 lb

## 2021-07-30 DIAGNOSIS — Z86718 Personal history of other venous thrombosis and embolism: Secondary | ICD-10-CM | POA: Insufficient documentation

## 2021-07-30 DIAGNOSIS — I509 Heart failure, unspecified: Secondary | ICD-10-CM | POA: Diagnosis not present

## 2021-07-30 DIAGNOSIS — Z8711 Personal history of peptic ulcer disease: Secondary | ICD-10-CM | POA: Diagnosis not present

## 2021-07-30 DIAGNOSIS — Z86711 Personal history of pulmonary embolism: Secondary | ICD-10-CM | POA: Insufficient documentation

## 2021-07-30 DIAGNOSIS — I2510 Atherosclerotic heart disease of native coronary artery without angina pectoris: Secondary | ICD-10-CM | POA: Diagnosis not present

## 2021-07-30 DIAGNOSIS — I2699 Other pulmonary embolism without acute cor pulmonale: Secondary | ICD-10-CM | POA: Insufficient documentation

## 2021-07-30 DIAGNOSIS — Z886 Allergy status to analgesic agent status: Secondary | ICD-10-CM | POA: Diagnosis not present

## 2021-07-30 DIAGNOSIS — E1142 Type 2 diabetes mellitus with diabetic polyneuropathy: Secondary | ICD-10-CM | POA: Insufficient documentation

## 2021-07-30 DIAGNOSIS — Z803 Family history of malignant neoplasm of breast: Secondary | ICD-10-CM | POA: Diagnosis not present

## 2021-07-30 DIAGNOSIS — R160 Hepatomegaly, not elsewhere classified: Secondary | ICD-10-CM | POA: Diagnosis not present

## 2021-07-30 DIAGNOSIS — Z79899 Other long term (current) drug therapy: Secondary | ICD-10-CM | POA: Insufficient documentation

## 2021-07-30 DIAGNOSIS — R918 Other nonspecific abnormal finding of lung field: Secondary | ICD-10-CM | POA: Diagnosis not present

## 2021-07-30 DIAGNOSIS — D649 Anemia, unspecified: Secondary | ICD-10-CM | POA: Diagnosis not present

## 2021-07-30 DIAGNOSIS — Z8616 Personal history of COVID-19: Secondary | ICD-10-CM | POA: Diagnosis not present

## 2021-07-30 DIAGNOSIS — Z8249 Family history of ischemic heart disease and other diseases of the circulatory system: Secondary | ICD-10-CM | POA: Insufficient documentation

## 2021-07-30 DIAGNOSIS — Z7901 Long term (current) use of anticoagulants: Secondary | ICD-10-CM | POA: Diagnosis not present

## 2021-07-30 DIAGNOSIS — E785 Hyperlipidemia, unspecified: Secondary | ICD-10-CM | POA: Insufficient documentation

## 2021-07-30 DIAGNOSIS — Z8673 Personal history of transient ischemic attack (TIA), and cerebral infarction without residual deficits: Secondary | ICD-10-CM | POA: Insufficient documentation

## 2021-07-30 DIAGNOSIS — Z833 Family history of diabetes mellitus: Secondary | ICD-10-CM | POA: Diagnosis not present

## 2021-07-30 DIAGNOSIS — K625 Hemorrhage of anus and rectum: Secondary | ICD-10-CM | POA: Insufficient documentation

## 2021-07-30 NOTE — Progress Notes (Signed)
Reason for the request:    Pulmonary embolism  HPI: I was asked by Dr. Avon Gully to evaluate [name] for a pulmonary embolism.  He is a 60 year old man with a history of obesity and sleep apnea who was diagnosed with pulmonary embolism in September 2022.  At that time he presented with shortness of breath and found to have PE after recent COVID-19 infection.  At that time CT scan on June 26, 2021 showed pulmonary embolus with moderate clot burden and evidence of right heart strain noted.  He was started on Eliquis upon discharge.  He had presented on July 15, 2021 was bleeding per rectum and was evaluated by GI and endoscopy was negative for active bleeding.  Since his discharge, he reports feeling well without any major complaints.  He denies shortness of breath or difficulty breathing.  He denies any worsening hematochezia or melena.  He has very little rectal bleeding at times.  He does have history of DVT but has been remote without any family history of recurrent thrombosis.   He does not report any headaches, blurry vision, syncope or seizures. Does not report any fevers, chills or sweats.  Does not report any cough, wheezing or hemoptysis.  Does not report any chest pain, palpitation, orthopnea or leg edema.  Does not report any nausea, vomiting or abdominal pain.  Does not report any constipation or diarrhea.  Does not report any skeletal complaints.    Does not report frequency, urgency or hematuria.  Does not report any skin rashes or lesions. Does not report any heat or cold intolerance.  Does not report any lymphadenopathy or petechiae.  Does not report any anxiety or depression.  Remaining review of systems is negative.     Past Medical History:  Diagnosis Date   Anemia    Arthritis    Back pain    CAD (coronary artery disease)    a. s/p DES to LAD in 05/2016   Cervical radiculopathy    Chronic diastolic CHF (congestive heart failure) (HCC)    Chronic pain    Depression     DVT (deep venous thrombosis) (HCC)    Hematemesis    Hepatic steatosis    Hyperlipidemia    Hypertension    IBS (irritable bowel syndrome)    Morbid obesity (HCC)    OSA (obstructive sleep apnea)    Pancreatitis    PE (pulmonary thromboembolism) ([place])    Peripheral neuropathy    PUD (peptic ulcer disease)    Renal disorder    Stroke [facility])    a. ?details unclear - not seen on imaging when he was admitted in 05/2017 for TIA symptoms which were felt due to cervical radiculopathy.   Thoracic aortic ectasia (HCC)    a. 4.3cm ectatic ascending thoracic aorta by CT 06/2017.    Type 2 diabetes mellitus ([place])   :   Past Surgical History:  Procedure Laterality Date   CARDIAC CATHETERIZATION N/A 05/31/2016   Procedure: Left Heart Cath and Coronary Angiography;  Surgeon: Peter M Martinique, MD;  Location: [place] [facility];  Service: Cardiovascular;  Laterality: N/A;   CARDIAC CATHETERIZATION N/A 05/31/2016   Procedure: Intravascular Pressure Wire/FFR Study;  Surgeon: Peter M Martinique, MD;  Location: [place] [facility];  Service: Cardiovascular;  Laterality: N/A;   CARDIAC CATHETERIZATION N/A 05/31/2016   Procedure: Coronary Stent Intervention;  Surgeon: Peter M Martinique, MD;  Location: [place] [facility];  Service: Cardiovascular;  Laterality: N/A;   COLONOSCOPY N/A  07/17/2021   Procedure: COLONOSCOPY;  Surgeon: Wilford Corner, MD;  Location: [facility];  Service: Endoscopy;  Laterality: N/A;   COLONOSCOPY WITH PROPOFOL N/A 04/29/2020   Procedure: COLONOSCOPY WITH PROPOFOL;  Surgeon: Wilford Corner, MD;  Location: [place];  Service: Endoscopy;  Laterality: N/A;   ESOPHAGOGASTRODUODENOSCOPY N/A 04/29/2020   Procedure: ESOPHAGOGASTRODUODENOSCOPY (EGD);  Surgeon: Wilford Corner, MD;  Location: [place];  Service: Endoscopy;  Laterality: N/A;   ESOPHAGOGASTRODUODENOSCOPY (EGD) WITH PROPOFOL N/A 07/17/2021   Procedure: ESOPHAGOGASTRODUODENOSCOPY (EGD) WITH PROPOFOL;  Surgeon: Wilford Corner,  MD;  Location: [facility];  Service: Endoscopy;  Laterality: N/A;   LEFT HEART CATH AND CORONARY ANGIOGRAPHY N/A 12/08/2017   Procedure: LEFT HEART CATH AND CORONARY ANGIOGRAPHY;  Surgeon: Leonie Man, MD;  Location: [place] [facility];  Service: Cardiovascular;  Laterality: N/A;   LEFT HEART CATHETERIZATION WITH CORONARY ANGIOGRAM N/A 02/03/2014   Procedure: LEFT HEART CATHETERIZATION WITH CORONARY ANGIOGRAM;  Surgeon: Pixie Casino, MD;  Location: [facility] CATH LAB;  Service: Cardiovascular;  Laterality: N/A;   left leg stent      POLYPECTOMY  04/29/2020   Procedure: POLYPECTOMY;  Surgeon: Wilford Corner, MD;  Location: [facility] ENDOSCOPY;  Service: Endoscopy;;   POLYPECTOMY  07/17/2021   Procedure: POLYPECTOMY;  Surgeon: Wilford Corner, MD;  Location: [facility]OS[facility];  Service: Endoscopy;;  :   Current Outpatient Medications:    Accu-Chek FastClix Lancets MISC, 1 Device by Other route 2 (two) times daily. , Disp: , Rfl:    ACCU-CHEK GUIDE test strip, 1 each by Other route 2 (two) times daily., Disp: , Rfl:    albuterol (VENTOLIN HFA) 108 (90 Base) MCG/ACT inhaler, Inhale 2 puffs into the lungs every 6 (six) hours as needed for wheezing or shortness of breath. As Directed, Disp: , Rfl:    apixaban (ELIQUIS) 5 MG TABS tablet, Take 2 tablets (10mg ) twice daily for 7 days, then 1 tablet (5mg ) twice daily (Patient taking differently: Take 5 mg by mouth 2 (two) times daily.), Disp: 60 tablet, Rfl: 0   atorvastatin (LIPITOR) 40 MG tablet, Take 40 mg by mouth at bedtime., Disp: , Rfl:    carvedilol (COREG) 12.5 MG tablet, Take 12.5 mg by mouth 2 (two) times daily., Disp: , Rfl:    famotidine (PEPCID) 20 MG tablet, Take 20 mg by mouth daily., Disp: , Rfl:    fenofibrate (TRICOR) 145 MG tablet, Take 1 tablet (145 mg total) by mouth daily., Disp: 90 tablet, Rfl: 2   furosemide (LASIX) 40 MG tablet, Take 1 tablet (40 mg total) by mouth daily. PLEASE RESUME ONLY FROM 10/21/2020. DOSE HAS BEEN CHANGED.  (Patient taking differently: Take 40 mg by mouth 3 (three) times daily.), Disp: 90 tablet, Rfl: 0   glipiZIDE (GLUCOTROL) 10 MG tablet, Take 10 mg by mouth 2 (two) times daily., Disp: , Rfl: 4   HUMULIN R U-500 KWIKPEN 500 UNIT/ML kwikpen, Inject 80-90 Units into the skin in the morning, at noon, and at bedtime. Per sliding scale, Disp: , Rfl:    Insulin Pen Needle 31G X 5 MM MISC, Use 1 needle daily to inject insulin as prescribed (Patient taking differently: 1 each. Use 1 needle daily to inject insulin as prescribed), Disp: 100 each, Rfl: 2   ipratropium-albuterol (DUONEB) 0.5-2.5 (3) MG/3ML SOLN, Take 3 mLs by nebulization 2 (two) times daily., Disp: , Rfl:    isosorbide mononitrate (IMDUR) 30 MG 24 hr tablet, TAKE 1 TABLET BY MOUTH DAILY (Patient taking differently: Take 30 mg by mouth  daily.), Disp: 90 tablet, Rfl: 3   lisinopril (ZESTRIL) 5 MG tablet, Take 5 mg by mouth daily., Disp: , Rfl:    nitroGLYCERIN (NITROSTAT) 0.4 MG SL tablet, Place 1 tablet (0.4 mg total) under the tongue every 5 (five) minutes as needed for chest pain., Disp: 100 tablet, Rfl: 1   ondansetron (ZOFRAN) 4 MG tablet, Take 4 mg by mouth 2 (two) times daily as needed for nausea/vomiting., Disp: , Rfl:    Oxycodone HCl 20 MG TABS, Take 20 mg by mouth 4 (four) times daily as needed (pain)., Disp: , Rfl:    pantoprazole (PROTONIX) 40 MG tablet, Take 40 mg by mouth daily., Disp: , Rfl:    potassium chloride (MICRO-K) 10 MEQ CR capsule, Take 20 mEq by mouth daily., Disp: , Rfl:    sertraline (ZOLOFT) 50 MG tablet, Take 50 mg by mouth daily., Disp: , Rfl:    tiZANidine (ZANAFLEX) 4 MG tablet, Take 4 mg by mouth at bedtime as needed for muscle spasms., Disp: , Rfl:    traZODone (DESYREL) 50 MG tablet, Take 1 tablet (50 mg total) by mouth at bedtime., Disp: 30 tablet, Rfl: 11   Vitamin D, Ergocalciferol, (DRISDOL) 1.25 MG (50000 UT) CAPS capsule, Take 50,000 Units by mouth every Monday., Disp: , Rfl: :   Allergies  Allergen  Reactions   Coconut Flavor [Flavoring Agent] Hives   Coconut Oil Hives   Ibuprofen Other (See Comments)    Made gastric ulcers worse   Aleve [Naproxen] Other (See Comments)    DUE TO KIDNEYS   Nsaids Other (See Comments)    Stomach ulcers    Vascepa [Icosapent Ethyl] Other (See Comments)    headaches, chest pain, similar to sx of a stroke, hypotension   :   Family History  Problem Relation Age of Onset   Cancer Father    Hypertension Mother    Diabetes Mother    Breast cancer Mother    Hypertension Brother    Diabetes Brother    Hypertension Sister    Diabetes Sister   :   Social History   Socioeconomic History   Marital status: Single    Spouse name: Not on file   Number of children: 3   Years of education: 12   Highest education level: Not on file  Occupational History   Occupation: Disabled  Tobacco Use   Smoking status: Never   Smokeless tobacco: Never  Vaping Use   Vaping Use: Never used  Substance and Sexual Activity   Alcohol use: No   Drug use: No   Sexual activity: Not on file  Other Topics Concern   Not on file  Social History Narrative   Independent and ambulatory with cane.   Lives at home alone.   Right-handed.   1-2 cups caffeine per day.   Social Determinants of Health   Financial Resource Strain: Not on file  Food Insecurity: Not on file  Transportation Needs: Not on file  Physical Activity: Not on file  Stress: Not on file  Social Connections: Not on file  Intimate Partner Violence: Not on file  :  Pertinent items are noted in HPI.  Exam: Blood pressure 140/70, pulse 100, temperature 98.9 F (37.2 C), resp. rate 20, weight (!) 339 lb 11.2 oz (154.1 kg), SpO2 93 %. ECOG 1 General appearance: alert and cooperative appeared without distress. Head: atraumatic without any abnormalities. Eyes: conjunctivae/corneas clear. PERRL.  Sclera anicteric. Throat: lips, mucosa, and tongue normal; without oral thrush  or ulcers. Resp: clear  to auscultation bilaterally without rhonchi, wheezes or dullness to percussion. Cardio: regular rate and rhythm, S1, S2 normal, no murmur, click, rub or gallop GI: soft, non-tender; bowel sounds normal; no masses,  no organomegaly Skin: Skin color, texture, turgor normal. No rashes or lesions Lymph nodes: Cervical, supraclavicular, and axillary nodes normal. Neurologic: Grossly normal without any motor, sensory or deep tendon reflexes. Musculoskeletal: No joint deformity or effusion.   CBC    Component Value Date/Time   WBC 8.8 07/18/2021 0336   RBC 3.77 (L) 07/18/2021 0336   HGB 11.8 (L) 07/18/2021 0336   HCT 35.7 (L) 07/18/2021 0336   PLT 277 07/18/2021 0336   MCV 94.7 07/18/2021 0336   MCH 31.3 07/18/2021 0336   MCHC 33.1 07/18/2021 0336   RDW 13.6 07/18/2021 0336   LYMPHSABS 0.6 (L) 06/15/2021 1102   MONOABS 0.8 06/15/2021 1102   EOSABS 0.1 06/15/2021 1102   BASOSABS 0.0 06/15/2021 1102  '  IMPRESSION: 1. Positive for pulmonary embolus with moderate clot burden and evidence of right heart strain, RV to LV ratio of 1.08. 2. Patchy ground-glass opacity in the right upper lobe, slightly perifissural but not peripheral or wedge-shaped. Favor pneumonia over pulmonary infarct. 3. Chronic dilatation of the main pulmonary artery consistent with pulmonary arterial hypertension. 4. Coronary artery calcification or stent. 5. Incidental hepatomegaly and steatosis in the upper abdomen.   Critical Value/emergent results were called by telephone at the time of interpretation on 06/26/2021 at 6:58 pm to provider ANKIT NANAVATI  who verbally acknowledged these results.  Assessment and Plan:    60 year old with:  1.  Pulmonary embolism diagnosed in September 2022.  This was in the setting of recent COVID-19 infection and potentially provoked by immobility, morbid obesity among other factors.  He was anticoagulated with Eliquis and presented with a GI bleeding and required  hospitalization between September 22 and September 25.  Treatment options moving forward were discussed at this time.  Given the fact that he is thrombosis is presumably provoked from COVID-19 infection short duration of anticoagulation may be recommended.  Duration between 3 to 6 months to be reasonable.  I am in favor of continuing anticoagulation at this time given the work-up for his GI bleed has been negative and the complications associated with recurrent thrombosis would be more severe at this time.  The risks and benefits of continuing anticoagulation was debated at this time.  The risk of a fatal pulmonary embolism outweighs the risk of asymptomatic GI bleed.   After discussion today, I recommended continuing Eliquis for at least the next 3 to 6 months.  I will evaluate every 3 months to ensure stability.  If he has overt to GI bleeding then this will be reevaluated.   2.  Bleeding per rectum: Unclear etiology with work-up currently unremarkable.   3.  Anemia: Appears to be mild and asymptomatic.  Could be related to his recent GI bleed.  4.  Follow-up: Will be in 3 months for repeat follow-up.  45  minutes were dedicated to this visit. The time was spent on reviewing laboratory data, imaging studies, discussing treatment options, discussing differential diagnosis and answering questions regarding future plan.     A copy of this consult has been forwarded to the requesting physician.

## 2021-08-05 ENCOUNTER — Encounter (HOSPITAL_BASED_OUTPATIENT_CLINIC_OR_DEPARTMENT_OTHER): Payer: Medicaid Other | Admitting: Internal Medicine

## 2021-08-16 ENCOUNTER — Encounter (HOSPITAL_BASED_OUTPATIENT_CLINIC_OR_DEPARTMENT_OTHER): Payer: Medicaid Other | Attending: Internal Medicine | Admitting: Internal Medicine

## 2021-08-16 ENCOUNTER — Other Ambulatory Visit: Payer: Self-pay

## 2021-08-16 DIAGNOSIS — I872 Venous insufficiency (chronic) (peripheral): Secondary | ICD-10-CM | POA: Diagnosis not present

## 2021-08-16 DIAGNOSIS — I2510 Atherosclerotic heart disease of native coronary artery without angina pectoris: Secondary | ICD-10-CM | POA: Insufficient documentation

## 2021-08-16 DIAGNOSIS — L97821 Non-pressure chronic ulcer of other part of left lower leg limited to breakdown of skin: Secondary | ICD-10-CM | POA: Diagnosis not present

## 2021-08-16 DIAGNOSIS — I890 Lymphedema, not elsewhere classified: Secondary | ICD-10-CM | POA: Diagnosis not present

## 2021-08-16 DIAGNOSIS — L97811 Non-pressure chronic ulcer of other part of right lower leg limited to breakdown of skin: Secondary | ICD-10-CM | POA: Diagnosis not present

## 2021-08-16 DIAGNOSIS — E1142 Type 2 diabetes mellitus with diabetic polyneuropathy: Secondary | ICD-10-CM | POA: Diagnosis not present

## 2021-08-16 DIAGNOSIS — Z9981 Dependence on supplemental oxygen: Secondary | ICD-10-CM | POA: Diagnosis not present

## 2021-08-16 DIAGNOSIS — I5032 Chronic diastolic (congestive) heart failure: Secondary | ICD-10-CM | POA: Diagnosis not present

## 2021-08-16 DIAGNOSIS — E1159 Type 2 diabetes mellitus with other circulatory complications: Secondary | ICD-10-CM | POA: Diagnosis not present

## 2021-08-16 DIAGNOSIS — L97829 Non-pressure chronic ulcer of other part of left lower leg with unspecified severity: Secondary | ICD-10-CM

## 2021-08-16 DIAGNOSIS — I110 Hypertensive heart disease with heart failure: Secondary | ICD-10-CM | POA: Diagnosis not present

## 2021-08-16 DIAGNOSIS — E119 Type 2 diabetes mellitus without complications: Secondary | ICD-10-CM

## 2021-08-16 NOTE — Progress Notes (Signed)
[name], [name] ([id number]) Visit Report for 08/16/2021 Chief Complaint Document Details Patient Name: Date of Service: [place] [name] 08/16/2021 7:30 A M Medical Record Number: [id number] Patient Account Number: [id number] Date of Birth/Sex: Treating RN: 16-Jul-1961 (60 y.o. Male) Primary Care Provider: Windy Stein Other Clinician: Referring Provider: Treating Provider/Extender: [name] in Treatment: 0 Information Obtained from: Patient Chief Complaint Bilateral lower extremity scattered blisters. 1 small open wound limited to skin breakdown on the right lower extremity. Electronic Signature(s) Signed: 08/16/2021 9:36:37 AM By: Juan Shan DO Entered By: [name] on 08/16/2021 09:13:59 -------------------------------------------------------------------------------- HPI Details Patient Name: Date of Service: [name], [name] 08/16/2021 7:30 A M Medical Record Number: [id number] Patient Account Number: [id number] Date of Birth/Sex: Treating RN: 04/07/1961 (60 y.o. Male) Primary Care Provider: Windy Stein Other Clinician: Referring Provider: Treating Provider/Extender: [name] in Treatment: 0 History of Present Illness HPI Description: Admission 08/16/2021 [name] is a 60 year old male with a past medical history of CVA, hypertension, uncontrolled type 2 diabetes on insulin complicated by peripheral neuropathy, Chronic venous insufficiency/lymphedema, diastolic CHF, and left lower extremity DVT that presents to clinic today for an open wound to his right lower extremity. He reports an ongoing issue with blistering to his legs and weeping that wax and wane over the past 6 months. He was previously seen in our clinic in June 2022 however he had closed wounds at that time. He reported that his diuretic was increased then and helped with symptom control. About 2 months ago he developed increased blistering and swelling  again. He reports wearing 20/30 compression stockings however it is unclear if he is doing this daily since he reports the stockings are very difficult to put on. He currently denies signs of infection. Electronic Signature(s) Signed: 08/16/2021 9:36:37 AM By: Juan Shan DO Entered By: [name] on 08/16/2021 09:23:12 -------------------------------------------------------------------------------- Physical Exam Details Patient Name: Date of Service: [name], [name] 08/16/2021 7:30 A M Medical Record Number: [id number] Patient Account Number: [id number] Date of Birth/Sex: Treating RN: 07-Aug-1961 (60 y.o. Male) Primary Care Provider: Windy Stein Other Clinician: Referring Provider: Treating Provider/Extender: [name] in Treatment: 0 Constitutional respirations regular, non-labored and within target range for patient.. Cardiovascular 2+ dorsalis pedis/posterior tibialis pulses. Psychiatric pleasant and cooperative. Notes Right lower extremity: Small open wound limited to skin breakdown on the posterior aspect. Bilateral lower extremity lymphedema skin changes. 2+ pitting edema to the knee. No signs of infection on exam Electronic Signature(s) Signed: 08/16/2021 9:36:37 AM By: Juan Shan DO Entered By: [name] on 08/16/2021 09:31:05 -------------------------------------------------------------------------------- Physician Orders Details Patient Name: Date of Service: [name], [name] 08/16/2021 7:30 A M Medical Record Number: [id number] Patient Account Number: [id number] Date of Birth/Sex: Treating RN: November 21, 1960 (59 y.o. Male) [name] Primary Care Provider: Windy Stein Other Clinician: Referring Provider: Treating Provider/Extender: [name] in Treatment: 0 Verbal / Phone Orders: No Diagnosis Coding ICD-10 Coding Code Description I89.0 Lymphedema, not elsewhere classified Follow-up  Appointments ppointment in 1 week. - Dr. Heber Stein Return Juan for bilateral leg measurements and order new compression garments for legs. Bathing/ Shower/ Hygiene May shower with protection but do not get wound dressing(s) wet. Edema Control - Lymphedema / SCD / Other Elevate legs to the level of the heart or above for 30 minutes daily and/or when sitting, a frequency of: - 3-4 times a day throughout the day. Avoid standing for long periods of time. Exercise regularly Non Wound Condition Left Lower Extremity  Other Non Wound Condition Orders/Instructions: - lotion, TCA 3 layer compression wrap. Wound Treatment Wound #1 - Lower Leg Wound Laterality: Right, Posterior Cleanser: Soap and Water Discharge Instructions: May shower and wash wound with dial antibacterial soap and water prior to dressing change. Peri-Wound Care: Triamcinolone 15 (g) Discharge Instructions: Use triamcinolone 15 (g) as directed Peri-Wound Care: Sween Lotion (Moisturizing lotion) Discharge Instructions: Apply moisturizing lotion as directed Prim Dressing: KerraCel Ag Gelling Fiber Dressing, 2x2 in (silver alginate) ary Discharge Instructions: Apply silver alginate to wound bed as instructed Secondary Dressing: Woven Gauze Sponges 2x2 in Discharge Instructions: Apply over primary dressing as directed. Compression Wrap: ThreePress (3 layer compression wrap) Discharge Instructions: Apply three layer compression as directed. Electronic Signature(s) Signed: 08/16/2021 9:36:37 AM By: Juan Shan DO Entered By: [name] on 08/16/2021 09:31:32 -------------------------------------------------------------------------------- Problem List Details Patient Name: Date of Service: [name], [place] 08/16/2021 7:30 A M Medical Record Number: [id number] Patient Account Number: [id number] Date of Birth/Sex: Treating RN: 1961/05/30 (59 y.o. Male) [name] Primary Care Provider: Windy Stein Other  Clinician: Referring Provider: Treating Provider/Extender: [name] in Treatment: 0 Active Problems ICD-10 Encounter Code Description Active Date MDM Diagnosis L97.811 Non-pressure chronic ulcer of other part of right lower leg limited to breakdown 08/16/2021 No Yes of skin I89.0 Lymphedema, not elsewhere classified 08/16/2021 No Yes I87.2 Venous insufficiency (chronic) (peripheral) 08/16/2021 No Yes E11.59 Type 2 diabetes mellitus with other circulatory complications 75/64/3329 No Yes J18.84 Chronic diastolic (congestive) heart failure 08/16/2021 No Yes Inactive Problems Resolved Problems Electronic Signature(s) Signed: 08/16/2021 9:36:37 AM By: Juan Shan DO Entered By: [name] on 08/16/2021 09:12:52 -------------------------------------------------------------------------------- Progress Note Details Patient Name: Date of Service: [name], [place] 08/16/2021 7:30 A M Medical Record Number: [id number] Patient Account Number: [id number] Date of Birth/Sex: Treating RN: 1961-01-04 (60 y.o. Male) Primary Care Provider: Windy Stein Other Clinician: Referring Provider: Treating Provider/Extender: [name] in Treatment: 0 Subjective Chief Complaint Information obtained from Patient Bilateral lower extremity scattered blisters. 1 small open wound limited to skin breakdown on the right lower extremity. History of Present Illness (HPI) Admission 08/16/2021 [name] is a 60 year old male with a past medical history of CVA, hypertension, uncontrolled type 2 diabetes on insulin complicated by peripheral neuropathy, Chronic venous insufficiency/lymphedema, diastolic CHF, and left lower extremity DVT that presents to clinic today for an open wound to his right lower extremity. He reports an ongoing issue with blistering to his legs and weeping that wax and wane over the past 6 months. He was previously seen in our clinic in June 2022  however he had closed wounds at that time. He reported that his diuretic was increased then and helped with symptom control. About 2 months ago he developed increased blistering and swelling again. He reports wearing 20/30 compression stockings however it is unclear if he is doing this daily since he reports the stockings are very difficult to put on. He currently denies signs of infection. Patient History Unable to Obtain Patient History due to Altered Mental Status. Information obtained from Patient. Allergies ibuprofen, Aleve, NSAIDS (Non-Steroidal Anti-Inflammatory Drug), Vascepa, coconut flavor Family History Cancer - Mother,Father, Diabetes - Mother,Siblings, Hypertension - Siblings,Mother, Seizures - Mother,Siblings, Stroke - Siblings, No family history of Heart Disease, Hereditary Spherocytosis, Kidney Disease, Lung Disease, Thyroid Problems, Tuberculosis. Social History Never smoker, Marital Status - Divorced, Alcohol Use - Rarely, Drug Use - No History, Caffeine Use - Daily. Medical History Eyes Denies history of Cataracts, Glaucoma, Optic Neuritis Ear/Nose/Mouth/Throat Denies  history of Chronic sinus problems/congestion, Middle ear problems Hematologic/Lymphatic Denies history of Anemia, Hemophilia, Human Immunodeficiency Virus, Lymphedema, Sickle Cell Disease Respiratory Denies history of Aspiration, Asthma, Chronic Obstructive Pulmonary Disease (COPD), Pneumothorax, Sleep Apnea, Tuberculosis Cardiovascular Patient has history of Congestive Heart Failure, Coronary Artery Disease, Deep Vein Thrombosis, Hypertension Denies history of Angina, Arrhythmia, Hypotension, Myocardial Infarction, Peripheral Arterial Disease, Peripheral Venous Disease, Phlebitis, Vasculitis Gastrointestinal Denies history of Cirrhosis , Colitis, Crohnoos, Hepatitis A, Hepatitis B, Hepatitis C Endocrine Patient has history of Type II Diabetes Denies history of Type I Diabetes Genitourinary Denies  history of End Stage Renal Disease Immunological Denies history of Lupus Erythematosus, Raynaudoos, Scleroderma Integumentary (Skin) Denies history of History of Burn Musculoskeletal Denies history of Gout, Rheumatoid Arthritis, Osteoarthritis, Osteomyelitis Neurologic Patient has history of Neuropathy Denies history of Dementia, Quadriplegia, Paraplegia, Seizure Disorder Oncologic Denies history of Received Chemotherapy, Received Radiation Psychiatric Denies history of Anorexia/bulimia, Confinement Anxiety Patient is treated with Insulin, Oral Agents. Blood sugar is tested. Hospitalization/Surgery History - inpatient PE 06/2021. Medical A Surgical History Notes nd Respiratory PE September 2022 02 dependent at night Review of Systems (ROS) Constitutional Symptoms (General Health) Denies complaints or symptoms of Fatigue, Fever, Chills, Marked Weight Change. Eyes Denies complaints or symptoms of Dry Eyes, Vision Changes, Glasses / Contacts. Ear/Nose/Mouth/Throat Denies complaints or symptoms of Chronic sinus problems or rhinitis. Respiratory Denies complaints or symptoms of Chronic or frequent coughs, Shortness of Breath. Gastrointestinal Denies complaints or symptoms of Frequent diarrhea, Nausea, Vomiting. Endocrine Denies complaints or symptoms of Heat/cold intolerance. Integumentary (Skin) Complains or has symptoms of Wounds - both legs. Musculoskeletal Denies complaints or symptoms of Muscle Pain, Muscle Weakness. Neurologic Denies complaints or symptoms of Numbness/parasthesias. Objective Constitutional respirations regular, non-labored and within target range for patient.. Vitals Time Taken: 7:47 AM, Height: 71 in, Source: Stated, Weight: 330 lbs, Source: Stated, BMI: 46, Temperature: 98.2 F, Pulse: 71 bpm, Respiratory Rate: 20 breaths/min, Blood Pressure: 177/82 mmHg, Capillary Blood Glucose: 185 mg/dl. General Notes: Have not taken BP medication this  morning. Cardiovascular 2+ dorsalis pedis/posterior tibialis pulses. Psychiatric pleasant and cooperative. General Notes: Right lower extremity: Small open wound limited to skin breakdown on the posterior aspect. Bilateral lower extremity lymphedema skin changes. 2+ pitting edema to the knee. No signs of infection on exam Integumentary (Hair, Skin) Wound #1 status is Open. Original cause of wound was Gradually Appeared. The date acquired was: 05/24/2021. The wound is located on the Right,Posterior Lower Leg. The wound measures 0.5cm length x 0.4cm width x 0.1cm depth; 0.157cm^2 area and 0.016cm^3 volume. There is Fat Layer (Subcutaneous Tissue) exposed. There is no tunneling or undermining noted. There is a medium amount of serosanguineous drainage noted. The wound margin is distinct with the outline attached to the wound base. There is large (67-100%) pink, pale granulation within the wound bed. There is no necrotic tissue within the wound bed. Assessment Active Problems ICD-10 Non-pressure chronic ulcer of other part of right lower leg limited to breakdown of skin Lymphedema, not elsewhere classified Venous insufficiency (chronic) (peripheral) Type 2 diabetes mellitus with other circulatory complications Chronic diastolic (congestive) heart failure Patient presents with an ongoing issue of chronic bilateral lower extremity blistering followed by weeping of his legs. T oday he has a small open wound to the right lower extremity. There is no weeping on exam. He has a few scattered blisters on the right and left leg that will likely open soon. ABIs were 1.0 on the right and left. He also has diastolic heart  failure that is contributing to this issue. For now we will do compression wraps to both legs with silver alginate. He knows he cannot get these wet and cannot wear these for more than a week. Once wounds are healed He may do better with juxta light compression versus the compression  stockings since he has trouble putting these on. We will order these at next visit. He will need new leg measurements for this. No signs of infection on exam. Procedures Wound #1 Pre-procedure diagnosis of Wound #1 is a Lymphedema located on the Right,Posterior Lower Leg . There was a Three Layer Compression Therapy Procedure by Juan Pilling, RN. Post procedure Diagnosis Wound #1: Same as Pre-Procedure There was a Three Layer Compression Therapy Procedure by Juan Pilling, RN. Post procedure Diagnosis Wound #: Same as Pre-Procedure Plan Follow-up Appointments: Return Appointment in 1 week. - Dr. Heber Edgerton Plan for bilateral leg measurements and order new compression garments for legs. Bathing/ Shower/ Hygiene: May shower with protection but do not get wound dressing(s) wet. Edema Control - Lymphedema / SCD / Other: Elevate legs to the level of the heart or above for 30 minutes daily and/or when sitting, a frequency of: - 3-4 times a day throughout the day. Avoid standing for long periods of time. Exercise regularly Non Wound Condition: Other Non Wound Condition Orders/Instructions: - lotion, TCA 3 layer compression wrap. WOUND #1: - Lower Leg Wound Laterality: Right, Posterior Cleanser: Soap and Water Discharge Instructions: May shower and wash wound with dial antibacterial soap and water prior to dressing change. Peri-Wound Care: Triamcinolone 15 (g) Discharge Instructions: Use triamcinolone 15 (g) as directed Peri-Wound Care: Sween Lotion (Moisturizing lotion) Discharge Instructions: Apply moisturizing lotion as directed Prim Dressing: KerraCel Ag Gelling Fiber Dressing, 2x2 in (silver alginate) ary Discharge Instructions: Apply silver alginate to wound bed as instructed Secondary Dressing: Woven Gauze Sponges 2x2 in Discharge Instructions: Apply over primary dressing as directed. Com pression Wrap: ThreePress (3 layer compression wrap) Discharge Instructions: Apply three layer  compression as directed. 1. Silver alginate under 3 layer compression 2. Follow-up in 1 week Electronic Signature(s) Signed: 08/16/2021 9:36:37 AM By: Juan Shan DO Entered By: [name] on 08/16/2021 09:35:36 -------------------------------------------------------------------------------- HxROS Details Patient Name: Date of Service: [name], [name] 08/16/2021 7:30 A M Medical Record Number: [id number] Patient Account Number: [id number] Date of Birth/Sex: Treating RN: 04-May-1961 (59 y.o. Male) [name] Primary Care Provider: Windy Stein Other Clinician: Referring Provider: Treating Provider/Extender: [name] in Treatment: 0 Unable to Obtain Patient History due to Altered Mental Status Information Obtained From Patient Constitutional Symptoms (General Health) Complaints and Symptoms: Negative for: Fatigue; Fever; Chills; Marked Weight Change Eyes Complaints and Symptoms: Negative for: Dry Eyes; Vision Changes; Glasses / Contacts Medical History: Negative for: Cataracts; Glaucoma; Optic Neuritis Ear/Nose/Mouth/Throat Complaints and Symptoms: Negative for: Chronic sinus problems or rhinitis Medical History: Negative for: Chronic sinus problems/congestion; Middle ear problems Respiratory Complaints and Symptoms: Negative for: Chronic or frequent coughs; Shortness of Breath Medical History: Negative for: Aspiration; Asthma; Chronic Obstructive Pulmonary Disease (COPD); Pneumothorax; Sleep Apnea; Tuberculosis Past Medical History Notes: PE September 2022 02 dependent at night Gastrointestinal Complaints and Symptoms: Negative for: Frequent diarrhea; Nausea; Vomiting Medical History: Negative for: Cirrhosis ; Colitis; Crohns; Hepatitis A; Hepatitis B; Hepatitis C Endocrine Complaints and Symptoms: Negative for: Heat/cold intolerance Medical History: Positive for: Type II Diabetes Negative for: Type I Diabetes Time with diabetes:  10 years Treated with: Insulin, Oral agents Blood sugar tested every day: Yes Tested :  TID Integumentary (Skin) Complaints and Symptoms: Positive for: Wounds - both legs Medical History: Negative for: History of Burn Musculoskeletal Complaints and Symptoms: Negative for: Muscle Pain; Muscle Weakness Medical History: Negative for: Gout; Rheumatoid Arthritis; Osteoarthritis; Osteomyelitis Neurologic Complaints and Symptoms: Negative for: Numbness/parasthesias Medical History: Positive for: Neuropathy Negative for: Dementia; Quadriplegia; Paraplegia; Seizure Disorder Hematologic/Lymphatic Medical History: Negative for: Anemia; Hemophilia; Human Immunodeficiency Virus; Lymphedema; Sickle Cell Disease Cardiovascular Medical History: Positive for: Congestive Heart Failure; Coronary Artery Disease; Deep Vein Thrombosis; Hypertension Negative for: Angina; Arrhythmia; Hypotension; Myocardial Infarction; Peripheral Arterial Disease; Peripheral Venous Disease; Phlebitis; Vasculitis Genitourinary Medical History: Negative for: End Stage Renal Disease Immunological Medical History: Negative for: Lupus Erythematosus; Raynauds; Scleroderma Oncologic Medical History: Negative for: Received Chemotherapy; Received Radiation Psychiatric Medical History: Negative for: Anorexia/bulimia; Confinement Anxiety Immunizations Pneumococcal Vaccine: Received Pneumococcal Vaccination: No Implantable Devices None Hospitalization / Surgery History Type of Hospitalization/Surgery inpatient PE 06/2021 Family and Social History Cancer: Yes - Mother,Father; Diabetes: Yes - Mother,Siblings; Heart Disease: No; Hereditary Spherocytosis: No; Hypertension: Yes - Siblings,Mother; Kidney Disease: No; Lung Disease: No; Seizures: Yes - Mother,Siblings; Stroke: Yes - Siblings; Thyroid Problems: No; Tuberculosis: No; Never smoker; Marital Status - Divorced; Alcohol Use: Rarely; Drug Use: No History; Caffeine Use:  Daily; Financial Concerns: No; Food, Clothing or Shelter Needs: No; Support System Lacking: No; Transportation Concerns: No Electronic Signature(s) Signed: 08/16/2021 9:36:37 AM By: Juan Shan DO Signed: 08/16/2021 4:57:17 PM By: Juan Pilling RN, BSN Entered By: [name] on 08/16/2021 07:49:56 -------------------------------------------------------------------------------- SuperBill Details Patient Name: Date of Service: [name], [name] 08/16/2021 Medical Record Number: [id number] Patient Account Number: [id number] Date of Birth/Sex: Treating RN: Jan 14, 1961 (59 y.o. Male) [name] Primary Care Provider: Windy Stein Other Clinician: Referring Provider: Treating Provider/Extender: [name] in Treatment: 0 Diagnosis Coding ICD-10 Codes Code Description L97.811 Non-pressure chronic ulcer of other part of right lower leg limited to breakdown of skin I89.0 Lymphedema, not elsewhere classified I87.2 Venous insufficiency (chronic) (peripheral) E11.59 Type 2 diabetes mellitus with other circulatory complications P23.30 Chronic diastolic (congestive) heart failure Facility Procedures CPT4: Code [id number] Description: 13 - WOUND CARE VISIT-LEV 3 EST PT Modifier: Quantity: 1 CPT4: [id number] Description: 81 BILATERAL: Application of multi-layer venous compression system; leg (below knee), including ankle and t. Modifier: Quantity: 1 Physician Procedures : CPT4 Code Description Modifier [id number] - WC PHYS LEVEL 3 - EST PT ICD-10 Diagnosis Description L97.811 Non-pressure chronic ulcer of other part of right lower leg limited to breakdown of skin I89.0 Lymphedema, not elsewhere classified I87.2  Venous insufficiency (chronic) (peripheral) T15.72 Chronic diastolic (congestive) heart failure Quantity: 1 Electronic Signature(s) Signed: 08/16/2021 9:36:37 AM By: Juan Shan DO Entered By: [name] on 08/16/2021 09:36:02

## 2021-08-16 NOTE — Progress Notes (Signed)
[name], [name] ([id number]) Visit Report for 08/16/2021 Abuse/Suicide Risk Screen Details Patient Name: Date of Service: [name], [name] 08/16/2021 7:30 A M Medical Record Number: [id number] Patient Account Number: [id number] Date of Birth/Sex: Treating RN: 1960/12/12 (59 y.o. Male) Deon Pilling Primary Care Keefer Soulliere: [name] Other Clinician: Referring Kieffer Blatz: Treating Daissy Yerian/Extender: Yaakov Guthrie in Treatment: 0 Abuse/Suicide Risk Screen Items Answer ABUSE RISK SCREEN: Has anyone close to you tried to hurt or harm you recentlyo No Do you feel uncomfortable with anyone in your familyo No Has anyone forced you do things that you didnt want to doo No Electronic Signature(s) Signed: 08/16/2021 4:57:17 PM By: Deon Pilling RN, BSN Entered By: Deon Pilling on 08/16/2021 07:50:46 -------------------------------------------------------------------------------- Activities of Daily Living Details Patient Name: Date of Service: [name], [name] 08/16/2021 7:30 A M Medical Record Number: [id number] Patient Account Number: [id number] Date of Birth/Sex: Treating RN: [birth year] (60 y.o. Male) Deon Pilling Primary Care Kirke Breach: [name] Other Clinician: Referring Merryl Buckels: Treating Orine Goga/Extender: Yaakov Guthrie in Treatment: 0 Activities of Daily Living Items Answer Activities of Daily Living (Please select one for each item) Drive Automobile Completely Able T Medications ake Completely Able Use T elephone Completely Able Care for Appearance Completely Able Use T oilet Completely Able Bath / Shower Completely Able Dress Self Completely Able Feed Self Completely Able Walk Completely Able Get In / Out Bed Completely Able Housework Completely Able Prepare Meals Completely [place] for Self Completely Able Electronic Signature(s) Signed: 08/16/2021 4:57:17 PM By: Deon Pilling RN, BSN Entered By: Deon Pilling on 08/16/2021 07:50:58 -------------------------------------------------------------------------------- Education Screening Details Patient Name: Date of Service: [name], [name] 08/16/2021 7:30 A M Medical Record Number: [id number] Patient Account Number: [id number] Date of Birth/Sex: Treating RN: [birth year] (59 y.o. Male) Deon Pilling Primary Care Tami Barren: [name] Other Clinician: Referring Shakeel Disney: Treating Semaj Coburn/Extender: Yaakov Guthrie in Treatment: 0 Primary Learner Assessed: Patient Learning Preferences/Education Level/Primary Language Learning Preference: Explanation, Demonstration, Printed Material Highest Education Level: High School Preferred Language: English Cognitive Barrier Language Barrier: No Translator Needed: No Memory Deficit: No Emotional Barrier: No Cultural/Religious Beliefs Affecting Medical Care: No Physical Barrier Impaired Vision: No Impaired Hearing: No Decreased Hand dexterity: No Knowledge/Comprehension Knowledge Level: High Comprehension Level: High Ability to understand written instructions: High Ability to understand verbal instructions: High Motivation Anxiety Level: Calm Cooperation: Cooperative Education Importance: Acknowledges Need Interest in Health Problems: Asks Questions Perception: Coherent Willingness to Engage in Self-Management High Activities: Readiness to Engage in Self-Management High Activities: Electronic Signature(s) Signed: 08/16/2021 4:57:17 PM By: Deon Pilling RN, BSN Entered By: Deon Pilling on 08/16/2021 07:51:23 -------------------------------------------------------------------------------- Fall Risk Assessment Details Patient Name: Date of Service: [name], [name] 08/16/2021 7:30 [place] Record Number: [id number] Patient Account Number: [id number] Date of Birth/Sex: Treating RN: April 20, 1961 (59 y.o. Male) Deon Pilling Primary Care Mekesha Solomon: [name] Other  Clinician: Referring Kassondra Geil: Treating Claramae Rigdon/Extender: Yaakov Guthrie in Treatment: 0 Fall Risk Assessment Items Have you had 2 or more falls in the last 12 monthso 0 No Have you had any fall that resulted in injury in the last 12 monthso 0 No FALLS RISK SCREEN History of falling - immediate or within 3 months 0 No Secondary diagnosis (Do you have 2 or more medical diagnoseso) 0 No Ambulatory aid None/bed rest/wheelchair/nurse 0 Yes Crutches/cane/walker 0 No Furniture 0 No Intravenous therapy Access/Saline/Heparin Lock 0 No Gait/Transferring Normal/ bed rest/ wheelchair 0 Yes Weak (short steps with or without shuffle, stooped  but able to lift head while walking, may seek 0 No support from furniture) Impaired (short steps with shuffle, may have difficulty arising from chair, head down, impaired 0 No balance) Mental Status Oriented to own ability 0 Yes Electronic Signature(s) Signed: 08/16/2021 4:57:17 PM By: Deon Pilling RN, BSN Entered By: Deon Pilling on 08/16/2021 07:51:31 -------------------------------------------------------------------------------- Foot Assessment Details Patient Name: Date of Service: [name], [name] 08/16/2021 7:30 [place] Record Number: [id number] Patient Account Number: [id number] Date of Birth/Sex: Treating RN: 24-Apr-1961 (59 y.o. Male) Deon Pilling Primary Care Ericca Labra: [name] Other Clinician: Referring Keiana Tavella: Treating Henritta Mutz/Extender: Yaakov Guthrie in Treatment: 0 Foot Assessment Items Site Locations + = Sensation present, - = Sensation absent, C = Callus, U = Ulcer R = Redness, W = Warmth, M = Maceration, PU = Pre-ulcerative lesion F = Fissure, S = Swelling, D = Dryness Assessment Right: Left: Other Deformity: No No Prior Foot Ulcer: No No Prior Amputation: No No Charcot Joint: No No Ambulatory Status: Ambulatory Without Help Gait: Steady Electronic Signature(s) Signed: 08/16/2021 4:57:17  PM By: Deon Pilling RN, BSN Entered By: Deon Pilling on 08/16/2021 08:05:40 -------------------------------------------------------------------------------- Nutrition Risk Screening Details Patient Name: Date of Service: [name], [name] 08/16/2021 7:30 A M Medical Record Number: [id number] Patient Account Number: [id number] Date of Birth/Sex: Treating RN: 1961/04/23 (59 y.o. Male) Deon Pilling Primary Care Dalayna Lauter: [name] Other Clinician: Referring Nygel Prokop: Treating Sharma Lawrance/Extender: Yaakov Guthrie in Treatment: 0 Height (in): 71 Weight (lbs): 330 Body Mass Index (BMI): 46 Nutrition Risk Screening Items Score Screening NUTRITION RISK SCREEN: I have an illness or condition that made me change the kind and/or amount of food I eat 2 Yes I eat fewer than two meals per day 0 No I eat few fruits and vegetables, or milk products 0 No I have three or more drinks of beer, liquor or wine almost every day 0 No I have tooth or mouth problems that make it hard for me to eat 0 No I don't always have enough money to buy the food I need 0 No I eat alone most of the time 0 No I take three or more different prescribed or over-the-counter drugs a day 1 Yes Without wanting to, I have lost or gained 10 pounds in the last six months 0 No I am not always physically able to shop, cook and/or feed myself 0 No Nutrition Protocols Good Risk Protocol Provide education on elevated blood Moderate Risk Protocol 0 sugars and impact on wound healing, as applicable High Risk Proctocol Risk Level: Moderate Risk Score: 3 Electronic Signature(s) Signed: 08/16/2021 4:57:17 PM By: Deon Pilling RN, BSN Entered By: Deon Pilling on 08/16/2021 07:51:40

## 2021-08-16 NOTE — Progress Notes (Signed)
YUVIN, BUSSIERE ([id number]) Visit Report for 08/16/2021 Allergy List Details Patient Name: Date of Service: [name], [name] 08/16/2021 7:30 A M Medical Record Number: [id number] Patient Account Number: [id number] Date of Birth/Sex: Treating RN: Aug 10, 1961 (59 y.o. Male) Deon Pilling Primary Care Latina Frank: [name] Other Clinician: Referring Janie Strothman: Treating Leighla Chestnutt/Extender: Yaakov Guthrie in Treatment: 0 Allergies Active Allergies ibuprofen Aleve NSAIDS (Non-Steroidal Anti-Inflammatory Drug) Vascepa coconut flavor Allergy Notes Electronic Signature(s) Signed: 08/16/2021 4:57:17 PM By: Deon Pilling RN, BSN Entered By: Deon Pilling on 08/16/2021 07:50:22 -------------------------------------------------------------------------------- Arrival Information Details Patient Name: Date of Service: [name], [name] 08/16/2021 7:30 A M Medical Record Number: [id number] Patient Account Number: [id number] Date of Birth/Sex: Treating RN: [birth year] (59 y.o. Male) Deon Pilling Primary Care Mukesh Kornegay: [name] Other Clinician: Referring Shai Rasmussen: Treating Hanh Kertesz/Extender: Yaakov Guthrie in Treatment: 0 Visit Information Patient Arrived: Ambulatory Arrival Time: 07:45 Accompanied By: self Transfer Assistance: None Patient Identification Verified: Yes Secondary Verification Process Completed: Yes Patient Requires Transmission-Based Precautions: No Patient Has Alerts: Yes Patient Alerts: Patient on Blood Thinner History Since Last Visit Added or deleted any medications: No Any new allergies or adverse reactions: No Had a fall or experienced change in activities of daily living that may affect risk of falls: No Signs or symptoms of abuse/neglect since last visito No Hospitalized since last visit: No Implantable device outside of the clinic excluding cellular tissue based products placed in the center since last visit: No Electronic  Signature(s) Signed: 08/16/2021 4:57:17 PM By: Deon Pilling RN, BSN Entered By: Deon Pilling on 08/16/2021 07:47:38 -------------------------------------------------------------------------------- Clinic Level of Care Assessment Details Patient Name: Date of Service: [name], [name] 08/16/2021 7:30 [place] Record Number: [id number] Patient Account Number: [id number] Date of Birth/Sex: Treating RN: 1960/11/29 (59 y.o. Male) Deon Pilling Primary Care Xeng [name]: [name] Other Clinician: Referring Ashby Leflore: Treating Morty Ortwein/Extender: Yaakov Guthrie in Treatment: 0 Clinic Level of Care Assessment Items TOOL 1 Quantity Score X- 1 0 Use when EandM and Procedure is performed on INITIAL visit ASSESSMENTS - Nursing Assessment / Reassessment X- 1 20 General Physical Exam (combine w/ comprehensive assessment (listed just below) when performed on new pt. evals) X- 1 25 Comprehensive Assessment (HX, ROS, Risk Assessments, Wounds Hx, etc.) ASSESSMENTS - Wound and Skin Assessment / Reassessment X- 1 10 Dermatologic / Skin Assessment (not related to wound area) ASSESSMENTS - Ostomy and/or Continence Assessment and Care []  - 0 Incontinence Assessment and Management []  - 0 Ostomy Care Assessment and Management (repouching, etc.) PROCESS - Coordination of Care []  - 0 Simple Patient / Family Education for ongoing care X- 1 20 Complex (extensive) Patient / Family Education for ongoing care X- 1 10 Staff obtains Programmer, systems, Records, T Results / Process Orders est []  - 0 Staff telephones HHA, Nursing Homes / Clarify orders / etc []  - 0 Routine Transfer to another Facility (non-emergent condition) []  - 0 Routine Hospital Admission (non-emergent condition) X- 1 15 New Admissions / Biomedical engineer / Ordering NPWT Apligraf, etc. , []  - 0 Emergency Hospital Admission (emergent condition) PROCESS - Special Needs []  - 0 Pediatric / Minor Patient Management []  -  0 Isolation Patient Management []  - 0 Hearing / Language / Visual special needs []  - 0 Assessment of Community assistance (transportation, D/C planning, etc.) []  - 0 Additional assistance / Altered mentation []  - 0 Support Surface(s) Assessment (bed, cushion, seat, etc.) INTERVENTIONS - Miscellaneous []  - 0 External ear exam []  - 0 Patient Transfer (multiple staff /  Hoyer Lift / Similar devices) []  - 0 Simple Staple / Suture removal (25 or less) []  - 0 Complex Staple / Suture removal (26 or more) []  - 0 Hypo/Hyperglycemic Management (do not check if billed separately) X- 1 15 Ankle / Brachial Index (ABI) - do not check if billed separately Has the patient been seen at the hospital within the last three years: Yes Total Score: 115 Level Of Care: New/Established - Level 3 Electronic Signature(s) Signed: 08/16/2021 4:57:17 PM By: Deon Pilling RN, BSN Entered By: Deon Pilling on 08/16/2021 08:38:13 -------------------------------------------------------------------------------- Compression Therapy Details Patient Name: Date of Service: [name], [name] 08/16/2021 7:30 [place] Record Number: [id number] Patient Account Number: [id number] Date of Birth/Sex: Treating RN: 03-21-1961 (59 y.o. Male) Deon Pilling Primary Care Labria Wos: [name] Other Clinician: Referring Rechelle Niebla: Treating Lyndell Gillyard/Extender: Yaakov Guthrie in Treatment: 0 Compression Therapy Performed for Wound Assessment: Wound #1 Right,Posterior Lower Leg Performed By: Clinician Deon Pilling, RN Compression Type: Three Layer Post Procedure Diagnosis Same as Pre-procedure Electronic Signature(s) Signed: 08/16/2021 4:57:17 PM By: Deon Pilling RN, BSN Entered By: Deon Pilling on 08/16/2021 08:33:37 -------------------------------------------------------------------------------- Compression Therapy Details Patient Name: Date of Service: [name], [place] 08/16/2021 7:30 A M Medical  Record Number: [id number] Patient Account Number: [id number] Date of Birth/Sex: Treating RN: [birth year] (59 y.o. Male) Deon Pilling Primary Care Rayola Everhart: [name] Other Clinician: Referring Lendy Dittrich: Treating Ileane Sando/Extender: Yaakov Guthrie in Treatment: 0 Compression Therapy Performed for Wound Assessment: NonWound Condition Lymphedema - Right Leg Performed By: Clinician Deon Pilling, RN Compression Type: Three Layer Post Procedure Diagnosis Same as Pre-procedure Electronic Signature(s) Signed: 08/16/2021 4:57:17 PM By: Deon Pilling RN, BSN Entered By: Deon Pilling on 08/16/2021 08:33:47 -------------------------------------------------------------------------------- Encounter Discharge Information Details Patient Name: Date of Service: [name], [name] 08/16/2021 7:30 [place] Record Number: [id number] Patient Account Number: [id number] Date of Birth/Sex: Treating RN: June 13, 1961 (59 y.o. Male) Deon Pilling Primary Care Nicky Milhouse: [name] Other Clinician: Referring Xochitl Egle: Treating Marius Betts/Extender: Yaakov Guthrie in Treatment: 0 Encounter Discharge Information Items Discharge Condition: Stable Ambulatory Status: Ambulatory Discharge Destination: Home Transportation: Private Auto Accompanied By: self Schedule Follow-up Appointment: Yes Clinical Summary of Care: Electronic Signature(s) Signed: 08/16/2021 4:57:17 PM By: Deon Pilling RN, BSN Entered By: Deon Pilling on 08/16/2021 08:39:46 -------------------------------------------------------------------------------- Lower Extremity Assessment Details Patient Name: Date of Service: [name], [name] 08/16/2021 7:30 A M Medical Record Number: [id number] Patient Account Number: [id number] Date of Birth/Sex: Treating RN: 1961/02/10 (59 y.o. Male) Deon Pilling Primary Care Libbey Duce: [name] Other Clinician: Referring Teryl Gubler: Treating Maylea Soria/Extender: Yaakov Guthrie in Treatment: 0 Edema Assessment Assessed: [Left: Yes] [Right: Yes] Edema: [Left: Yes] [Right: Yes] Calf Left: Right: Point of Measurement: 34 cm From Medial Instep 48 cm 47 cm Ankle Left: Right: Point of Measurement: 12 cm From Medial Instep 26.5 cm 26 cm Knee To Floor Left: Right: From Medial Instep 44 cm 44 cm Vascular Assessment Pulses: Dorsalis Pedis Palpable: [Left:Yes] [Right:Yes] Doppler Audible: [Left:Yes] [Right:Yes] Posterior Tibial Palpable: [Left:No] [Right:No] Doppler Audible: [Left:Inaudible] [Right:Inaudible] Blood Pressure: Brachial: [Right:177] Ankle: [Left:Dorsalis Pedis: 178 1.01] [Right:Dorsalis Pedis: 180 1.02] Electronic Signature(s) Signed: 08/16/2021 4:57:17 PM By: Deon Pilling RN, BSN Entered By: Deon Pilling on 08/16/2021 08:04:47 -------------------------------------------------------------------------------- Multi Wound Chart Details Patient Name: Date of Service: [name], [name] 08/16/2021 7:30 A M Medical Record Number: [id number] Patient Account Number: [id number] Date of Birth/Sex: Treating RN: Dec 28, 1960 (60 y.o. Male) Primary Care Valorie Mcgrory: [name] Other Clinician: Referring Grant Swager: Treating Lataysha Vohra/Extender: [place],  Jessica Weeks in Treatment: 0 Vital Signs Height(in): 71 Capillary Blood Glucose(mg/dl): 185 Weight(lbs): 330 Pulse(bpm): 40 Body Mass Index(BMI): 78 Blood Pressure(mmHg): 177/82 Temperature(F): 98.2 Respiratory Rate(breaths/min): 20 Photos: [N/A:N/A] Right, Posterior Lower Leg N/A N/A Wound Location: Gradually Appeared N/A N/A Wounding Event: Lymphedema N/A N/A Primary Etiology: Congestive Heart Failure, Coronary N/A N/A Comorbid History: Artery Disease, Deep Vein Thrombosis, Hypertension, Type II Diabetes, Neuropathy 05/24/2021 N/A N/A Date Acquired: 0 N/A N/A Weeks of Treatment: Open N/A N/A Wound Status: 0.5x0.4x0.1 N/A N/A Measurements L x W x D (cm) 0.157 N/A  N/A A (cm) : rea 0.016 N/A N/A Volume (cm) : 0.00% N/A N/A % Reduction in Area: 0.00% N/A N/A % Reduction in Volume: Full Thickness Without Exposed N/A N/A Classification: Support Structures Medium N/A N/A Exudate Amount: Serosanguineous N/A N/A Exudate Type: red, brown N/A N/A Exudate Color: Distinct, outline attached N/A N/A Wound Margin: Large (67-100%) N/A N/A Granulation Amount: Pink, Pale N/A N/A Granulation Quality: None Present (0%) N/A N/A Necrotic Amount: Fat Layer (Subcutaneous Tissue): Yes N/A N/A Exposed Structures: Fascia: No Tendon: No Muscle: No Joint: No Bone: No Small (1-33%) N/A N/A Epithelialization: Compression Therapy N/A N/A Procedures Performed: Treatment Notes Wound #1 (Lower Leg) Wound Laterality: Right, Posterior Cleanser Soap and Water Discharge Instruction: May shower and wash wound with dial antibacterial soap and water prior to dressing change. Peri-Wound Care Triamcinolone 15 (g) Discharge Instruction: Use triamcinolone 15 (g) as directed Sween Lotion (Moisturizing lotion) Discharge Instruction: Apply moisturizing lotion as directed Topical Primary Dressing KerraCel Ag Gelling Fiber Dressing, 2x2 in (silver alginate) Discharge Instruction: Apply silver alginate to wound bed as instructed Secondary Dressing Woven Gauze Sponges 2x2 in Discharge Instruction: Apply over primary dressing as directed. Secured With Compression Wrap ThreePress (3 layer compression wrap) Discharge Instruction: Apply three layer compression as directed. Compression Stockings Add-Ons Electronic Signature(s) Signed: 08/16/2021 9:36:37 AM By: [name] DO Entered By: [name] on 08/16/2021 09:12:57 -------------------------------------------------------------------------------- Multi-Disciplinary Care Plan Details Patient Name: Date of Service: [name], [name] 08/16/2021 7:30 A M Medical Record Number: [id number] Patient  Account Number: [id number] Date of Birth/Sex: Treating RN: August 03, 1961 (59 y.o. Male) Deon Pilling Primary Care Krystel Fletchall: [name] Other Clinician: Referring Chiyo Fay: Treating Billyjoe Go/Extender: Yaakov Guthrie in Treatment: 0 Active Inactive Orientation to the Wound Care Program Nursing Diagnoses: Knowledge deficit related to the wound healing center program Goals: Patient/caregiver will verbalize understanding of the [place] Program Date Initiated: 08/16/2021 Target Resolution Date: 08/27/2021 Goal Status: Active Interventions: Provide education on orientation to the wound center Notes: Wound/Skin Impairment Nursing Diagnoses: Knowledge deficit related to ulceration/compromised skin integrity Goals: Patient/caregiver will verbalize understanding of skin care regimen Date Initiated: 08/16/2021 Target Resolution Date: 08/27/2021 Goal Status: Active Interventions: Assess patient/caregiver ability to perform ulcer/skin care regimen upon admission and as needed Assess ulceration(s) every visit Provide education on ulcer and skin care Treatment Activities: Skin care regimen initiated : 08/16/2021 Topical wound management initiated : 08/16/2021 Notes: Electronic Signature(s) Signed: 08/16/2021 4:57:17 PM By: Deon Pilling RN, BSN Entered By: Deon Pilling on 08/16/2021 08:30:38 -------------------------------------------------------------------------------- Non-Wound Condition Assessment Details Patient Name: Date of Service: [name], [name] 08/16/2021 7:30 A M Medical Record Number: [id number] Patient Account Number: [id number] Date of Birth/Sex: Treating RN: November 17, 1960 (59 y.o. Male) Deon Pilling Primary Care Lauren Aguayo: [name] Other Clinician: Referring Sulayman Manning: Treating Zayda [name]/Extender: Yaakov Guthrie in Treatment: 0 Non-Wound Condition: Condition: Lymphedema Location: Leg Side: Left Notes cobblestone appearance with  dry flaky skin. Electronic Signature(s) Signed: 08/16/2021 4:57:17  PM By: Deon Pilling RN, BSN Entered By: Deon Pilling on 08/16/2021 08:32:40 -------------------------------------------------------------------------------- Non-Wound Condition Assessment Details Patient Name: Date of Service: [name], [name] 08/16/2021 7:30 A M Medical Record Number: [id number] Patient Account Number: [id number] Date of Birth/Sex: Treating RN: 1961/08/05 (59 y.o. Male) Deon Pilling Primary Care Cailan Antonucci: [name] Other Clinician: Referring Sava Proby: Treating Bria Portales/Extender: Yaakov Guthrie in Treatment: 0 Non-Wound Condition: Condition: Lymphedema Location: Leg Side: Right Notes cobblestone appearance with dry flaky skin. Electronic Signature(s) Signed: 08/16/2021 4:57:17 PM By: Deon Pilling RN, BSN Entered By: Deon Pilling on 08/16/2021 08:32:57 -------------------------------------------------------------------------------- Pain Assessment Details Patient Name: Date of Service: [name], [name] 08/16/2021 7:30 A M Medical Record Number: [id number] Patient Account Number: [id number] Date of Birth/Sex: Treating RN: [birth year] (59 y.o. Male) Deon Pilling Primary Care Quasim Doyon: [name] Other Clinician: Referring Seidy Labreck: Treating Jandiel Magallanes/Extender: Yaakov Guthrie in Treatment: 0 Active Problems Location of Pain Severity and Description of Pain Patient Has Paino Yes Site Locations Pain Location: Pain in Ulcers Rate the pain. Current Pain Level: 7 Worst Pain Level: 10 Least Pain Level: 0 Tolerable Pain Level: 8 Character of Pain Describe the Pain: Burning Pain Management and Medication Current Pain Management: Medication: No Cold Application: No Rest: No Massage: No Activity: No T.E.N.S.: No Heat Application: No Leg drop or elevation: No Is the Current Pain Management Adequate: Adequate How does your wound impact your activities of  daily livingo Sleep: No Bathing: No Appetite: No Relationship With Others: No Bladder Continence: No Emotions: No Bowel Continence: No Work: No Toileting: No Drive: No Dressing: No Hobbies: No Engineer, maintenance) Signed: 08/16/2021 4:57:17 PM By: Deon Pilling RN, BSN Entered By: Deon Pilling on 08/16/2021 07:52:26 -------------------------------------------------------------------------------- Patient/Caregiver Education Details Patient Name: Date of Service: [place] 10/24/2022andnbsp7:30 [place] Record Number: [id number] Patient Account Number: [id number] Date of Birth/Gender: Treating RN: [birth year] (59 y.o. Male) Deon Pilling Primary Care Physician: [name] Other Clinician: Referring Physician: Treating Physician/Extender: Yaakov Guthrie in Treatment: 0 Education Assessment Education Provided To: Patient Education Topics Provided Venous: Handouts: Controlling Swelling with Compression Stockings , Controlling Swelling with Multilayered Compression Wraps Methods: Explain/Verbal, Printed Responses: Reinforcements needed Electronic Signature(s) Signed: 08/16/2021 4:57:17 PM By: Deon Pilling RN, BSN Entered By: Deon Pilling on 08/16/2021 08:31:38 -------------------------------------------------------------------------------- Wound Assessment Details Patient Name: Date of Service: [name], [place]. 08/16/2021 7:30 A M Medical Record Number: [id number] Patient Account Number: [id number] Date of Birth/Sex: Treating RN: 1961-02-15 (60 y.o. Male) Primary Care Aquil Duhe: [name] Other Clinician: Referring Maxwell Martorano: Treating [place] Cost/Extender: [name] Weeks in Treatment: 0 Wound Status Wound Number: 1 Primary Lymphedema Etiology: Wound Location: Right, Posterior Lower Leg Wound Open Wounding Event: Gradually Appeared Status: Date Acquired: 05/24/2021 Comorbid Congestive Heart Failure, Coronary Artery Disease,  Deep Vein Weeks Of Treatment: 0 History: Thrombosis, Hypertension, Type II Diabetes, Neuropathy Clustered Wound: No Photos Wound Measurements Length: (cm) 0.5 Width: (cm) 0.4 Depth: (cm) 0.1 Area: (cm) 0.157 Volume: (cm) 0.016 % Reduction in Area: 0% % Reduction in Volume: 0% Epithelialization: Small (1-33%) Tunneling: No Undermining: No Wound Description Classification: Full Thickness Without Exposed Support Structures Wound Margin: Distinct, outline attached Exudate Amount: Medium Exudate Type: Serosanguineous Exudate Color: red, brown Wound Bed Granulation Amount: Large (67-100%) Granulation Quality: Pink, Pale Necrotic Amount: None Present (0%) Foul Odor After Cleansing: No Slough/Fibrino No Exposed Structure Fascia Exposed: No Fat Layer (Subcutaneous Tissue) Exposed: Yes Tendon Exposed: No Muscle Exposed: No Joint Exposed: No Bone Exposed: No Treatment Notes Wound #1 (Lower Leg)  Wound Laterality: Right, Posterior Cleanser Soap and Water Discharge Instruction: May shower and wash wound with dial antibacterial soap and water prior to dressing change. Peri-Wound Care Triamcinolone 15 (g) Discharge Instruction: Use triamcinolone 15 (g) as directed Sween Lotion (Moisturizing lotion) Discharge Instruction: Apply moisturizing lotion as directed Topical Primary Dressing KerraCel Ag Gelling Fiber Dressing, 2x2 in (silver alginate) Discharge Instruction: Apply silver alginate to wound bed as instructed Secondary Dressing Woven Gauze Sponges 2x2 in Discharge Instruction: Apply over primary dressing as directed. Secured With Compression Wrap ThreePress (3 layer compression wrap) Discharge Instruction: Apply three layer compression as directed. Compression Stockings Add-Ons Electronic Signature(s) Signed: 08/16/2021 4:57:17 PM By: Deon Pilling RN, BSN Entered By: Deon Pilling on 08/16/2021  08:06:07 -------------------------------------------------------------------------------- Vitals Details Patient Name: Date of Service: [name], [place] 08/16/2021 7:30 A M Medical Record Number: [id number] Patient Account Number: [id number] Date of Birth/Sex: Treating RN: 04/07/1961 (59 y.o. Male) Deon Pilling Primary Care Efrain Clauson: [name] Other Clinician: Referring Marilou Barnfield: Treating Malaisha Silliman/Extender: Yaakov Guthrie in Treatment: 0 Vital Signs Time Taken: 07:47 Temperature (F): 98.2 Height (in): 71 Pulse (bpm): 71 Source: Stated Respiratory Rate (breaths/min): 20 Weight (lbs): 330 Blood Pressure (mmHg): 177/82 Source: Stated Capillary Blood Glucose (mg/dl): 185 Body Mass Index (BMI): 46 Reference Range: 80 - 120 mg / dl Notes Have not taken BP medication this morning. Electronic Signature(s) Signed: 08/16/2021 4:57:17 PM By: Deon Pilling RN, BSN Entered By: Deon Pilling on 08/16/2021 07:53:26

## 2021-08-23 ENCOUNTER — Other Ambulatory Visit: Payer: Self-pay

## 2021-08-23 ENCOUNTER — Encounter (HOSPITAL_BASED_OUTPATIENT_CLINIC_OR_DEPARTMENT_OTHER): Payer: Medicaid Other | Admitting: Internal Medicine

## 2021-08-23 DIAGNOSIS — L97821 Non-pressure chronic ulcer of other part of left lower leg limited to breakdown of skin: Secondary | ICD-10-CM

## 2021-08-23 DIAGNOSIS — I890 Lymphedema, not elsewhere classified: Secondary | ICD-10-CM

## 2021-08-23 DIAGNOSIS — I872 Venous insufficiency (chronic) (peripheral): Secondary | ICD-10-CM | POA: Diagnosis not present

## 2021-08-23 DIAGNOSIS — L97811 Non-pressure chronic ulcer of other part of right lower leg limited to breakdown of skin: Secondary | ICD-10-CM | POA: Diagnosis not present

## 2021-08-23 NOTE — Progress Notes (Signed)
GARLAND, HINCAPIE ([id number]) Visit Report for 08/23/2021 Chief Complaint Document Details Patient Name: Date of Service: [name], [name] 08/23/2021 10:00 A M Medical Record Number: [id number] Patient Account Number: [id number] Date of Birth/Sex: Treating RN: 23-Aug-1961 (60 y.o. [name] Primary Care Provider: [name] Other Clinician: Referring Provider: Treating Provider/Extender: Veneda Melter Weeks in Treatment: 1 Information Obtained from: Patient Chief Complaint 08/16/2021: Bilateral lower extremity scattered blisters. 1 small open wound limited to skin breakdown on the right lower extremity. 08/23/2021; 1 small open wound limited to skin breakdown on the left lower extremity Electronic Signature(s) Signed: 08/23/2021 11:34:48 AM By: [name] DO Entered By: [name] on 08/23/2021 11:23:10 -------------------------------------------------------------------------------- HPI Details Patient Name: Date of Service: [name], [name] 08/23/2021 10:00 [place] Record Number: [id number] Patient Account Number: [id number] Date of Birth/Sex: Treating RN: January 24, 1961 (60 y.o. [name] Primary Care Provider: [name] Other Clinician: Referring Provider: Treating Provider/Extender: Veneda Melter Weeks in Treatment: 1 History of Present Illness HPI Description: Admission 08/16/2021 Mr. Tyshun Tuckerman is a 60 year old male with a past medical history of CVA, hypertension, uncontrolled type 2 diabetes on insulin complicated by peripheral neuropathy, Chronic venous insufficiency/lymphedema, diastolic CHF, and left lower extremity DVT that presents to clinic today for an open wound to his right lower extremity. He reports an ongoing issue with blistering to his legs and weeping that wax and wane over the past 6 months. He was previously seen in our clinic in June 2022 however he had closed wounds at that  time. He reported that his diuretic was increased then and helped with symptom control. About 2 months ago he developed increased blistering and swelling again. He reports wearing 20/30 compression stockings however it is unclear if he is doing this daily since he reports the stockings are very difficult to put on. He currently denies signs of infection. 10/31; patient presents for follow-up. He has tolerated the compression wraps well. The right leg wound has healed. He does have a small open wound now present on the left anterior lower extremity limited to skin breakdown. He would like to continue wrapping both legs. He states he cannot afford juxta lite compression and would like to use his compression stockings he currently owns. He denies signs of infection. Electronic Signature(s) Signed: 08/23/2021 11:34:48 AM By: [name] DO Entered By: [name] on 08/23/2021 11:27:35 -------------------------------------------------------------------------------- Physical Exam Details Patient Name: Date of Service: [name], [name] 08/23/2021 10:00 A M Medical Record Number: [id number] Patient Account Number: [id number] Date of Birth/Sex: Treating RN: 23-May-1961 (60 y.o. [name] Primary Care Provider: [name] Other Clinician: Referring Provider: Treating Provider/Extender: Veneda Melter Weeks in Treatment: 1 Constitutional respirations regular, non-labored and within target range for patient.. Cardiovascular 2+ dorsalis pedis/posterior tibialis pulses. Psychiatric pleasant and cooperative. Notes Right lower extremity: Epithelialization to previous wound site. On the left lower extremity there is a small open wound to the anterior aspect limited to skin breakdown. No surrounding signs of infection. Electronic Signature(s) Signed: 08/23/2021 11:34:48 AM By: [name] DO Entered By: [name] on 08/23/2021  11:28:19 -------------------------------------------------------------------------------- Physician Orders Details Patient Name: Date of Service: [name], [name] 08/23/2021 10:00 A M Medical Record Number: [id number] Patient Account Number: [id number] Date of Birth/Sex: Treating RN: November 14, 1960 (60 y.o. [name] Primary Care Provider: [name] Other Clinician: Referring Provider: Treating Provider/Extender: Veneda Melter Weeks in Treatment: 1 Verbal / Phone Orders: No Diagnosis  Coding ICD-10 Coding Code Description L97.811 Non-pressure chronic ulcer of other part of right lower leg limited to breakdown of skin L97.821 Non-pressure chronic ulcer of other part of left lower leg limited to breakdown of skin I89.0 Lymphedema, not elsewhere classified I87.2 Venous insufficiency (chronic) (peripheral) E11.59 Type 2 diabetes mellitus with other circulatory complications J28.78 Chronic diastolic (congestive) heart failure Follow-up Appointments ppointment in 1 week. - Dr. Heber [place] Return A Bring Compression Stockings to next visit. Bathing/ Shower/ Hygiene May shower with protection but do not get wound dressing(s) wet. Edema Control - Lymphedema / SCD / Other Elevate legs to the level of the heart or above for 30 minutes daily and/or when sitting, a frequency of: - 3-4 times a day throughout the day. Avoid standing for long periods of time. Exercise regularly Non Wound Condition Right Lower Extremity Other Non Wound Condition Orders/Instructions: - Lotion/TCA -3 layer compression wrap to right leg Wound Treatment Wound #2 - Lower Leg Wound Laterality: Left, Anterior Cleanser: Soap and Water 1 x Per Week Discharge Instructions: May shower and wash wound with dial antibacterial soap and water prior to dressing change. Cleanser: Wound Cleanser 1 x Per Week Discharge Instructions: Cleanse the wound with wound cleanser prior to applying a clean dressing  using gauze sponges, not tissue or cotton balls. Peri-Wound Care: Triamcinolone 15 (g) 1 x Per Week Discharge Instructions: Use triamcinolone 15 (g) as directed Peri-Wound Care: Sween Lotion (Moisturizing lotion) 1 x Per Week Discharge Instructions: Apply moisturizing lotion as directed Prim Dressing: KerraCel Ag Gelling Fiber Dressing, 2x2 in (silver alginate) 1 x Per Week ary Discharge Instructions: Apply silver alginate to wound bed as instructed Secondary Dressing: Woven Gauze Sponge, Non-Sterile 4x4 in 1 x Per Week Discharge Instructions: Apply over primary dressing as directed. Compression Wrap: ThreePress (3 layer compression wrap) 1 x Per Week Discharge Instructions: Apply three layer compression as directed. Electronic Signature(s) Signed: 08/23/2021 11:34:48 AM By: [name] DO Entered By: [name] on 08/23/2021 11:28:57 -------------------------------------------------------------------------------- Problem List Details Patient Name: Date of Service: [name], [name] 08/23/2021 10:00 A M Medical Record Number: [id number] Patient Account Number: [id number] Date of Birth/Sex: Treating RN: 03/05/1961 (60 y.o. [name] Primary Care Provider: [name] Other Clinician: Referring Provider: Treating Provider/Extender: Veneda Melter Weeks in Treatment: 1 Active Problems ICD-10 Encounter Code Description Active Date MDM Diagnosis L97.811 Non-pressure chronic ulcer of other part of right lower leg limited to breakdown 08/16/2021 No Yes of skin L97.821 Non-pressure chronic ulcer of other part of left lower leg limited to breakdown 08/23/2021 No Yes of skin I89.0 Lymphedema, not elsewhere classified 08/16/2021 No Yes I87.2 Venous insufficiency (chronic) (peripheral) 08/16/2021 No Yes E11.59 Type 2 diabetes mellitus with other circulatory complications 09/62/8366 No Yes Q94.76 Chronic diastolic (congestive) heart failure  08/16/2021 No Yes Inactive Problems Resolved Problems Electronic Signature(s) Signed: 08/23/2021 11:34:48 AM By: [name] DO Previous Signature: 08/23/2021 10:34:56 AM Version By: Lorrin Jackson Entered By: [name] on 08/23/2021 11:22:17 -------------------------------------------------------------------------------- Progress Note Details Patient Name: Date of Service: [name], [name] 08/23/2021 10:00 A M Medical Record Number: [id number] Patient Account Number: [id number] Date of Birth/Sex: Treating RN: [birth year] (60 y.o. [name] Primary Care Provider: [name] Other Clinician: Referring Provider: Treating Provider/Extender: Veneda Melter Weeks in Treatment: 1 Subjective Chief Complaint Information obtained from Patient 08/16/2021: Bilateral lower extremity scattered blisters. 1 small open wound limited to skin breakdown on the right lower extremity. 08/23/2021; 1 small open wound limited to skin  breakdown on the left lower extremity History of Present Illness (HPI) Admission 08/16/2021 Mr. Ysabel Cowgill is a 60 year old male with a past medical history of CVA, hypertension, uncontrolled type 2 diabetes on insulin complicated by peripheral neuropathy, Chronic venous insufficiency/lymphedema, diastolic CHF, and left lower extremity DVT that presents to clinic today for an open wound to his right lower extremity. He reports an ongoing issue with blistering to his legs and weeping that wax and wane over the past 6 months. He was previously seen in our clinic in June 2022 however he had closed wounds at that time. He reported that his diuretic was increased then and helped with symptom control. About 2 months ago he developed increased blistering and swelling again. He reports wearing 20/30 compression stockings however it is unclear if he is doing this daily since he reports the stockings are very difficult to put on. He currently  denies signs of infection. 10/31; patient presents for follow-up. He has tolerated the compression wraps well. The right leg wound has healed. He does have a small open wound now present on the left anterior lower extremity limited to skin breakdown. He would like to continue wrapping both legs. He states he cannot afford juxta lite compression and would like to use his compression stockings he currently owns. He denies signs of infection. Patient History Unable to Obtain Patient History due to Altered Mental Status. Information obtained from Patient. Family History Cancer - Mother,Father, Diabetes - Mother,Siblings, Hypertension - Siblings,Mother, Seizures - Mother,Siblings, Stroke - Siblings, No family history of Heart Disease, Hereditary Spherocytosis, Kidney Disease, Lung Disease, Thyroid Problems, Tuberculosis. Social History Never smoker, Marital Status - Divorced, Alcohol Use - Rarely, Drug Use - No History, Caffeine Use - Daily. Medical History Eyes Denies history of Cataracts, Glaucoma, Optic Neuritis Ear/Nose/Mouth/Throat Denies history of Chronic sinus problems/congestion, Middle ear problems Hematologic/Lymphatic Denies history of Anemia, Hemophilia, Human Immunodeficiency Virus, Lymphedema, Sickle Cell Disease Respiratory Denies history of Aspiration, Asthma, Chronic Obstructive Pulmonary Disease (COPD), Pneumothorax, Sleep Apnea, Tuberculosis Cardiovascular Patient has history of Congestive Heart Failure, Coronary Artery Disease, Deep Vein Thrombosis, Hypertension Denies history of Angina, Arrhythmia, Hypotension, Myocardial Infarction, Peripheral Arterial Disease, Peripheral Venous Disease, Phlebitis, Vasculitis Gastrointestinal Denies history of Cirrhosis , Colitis, Crohnoos, Hepatitis A, Hepatitis B, Hepatitis C Endocrine Patient has history of Type II Diabetes Denies history of Type I Diabetes Genitourinary Denies history of End Stage Renal  Disease Immunological Denies history of Lupus Erythematosus, Raynaudoos, Scleroderma Integumentary (Skin) Denies history of History of Burn Musculoskeletal Denies history of Gout, Rheumatoid Arthritis, Osteoarthritis, Osteomyelitis Neurologic Patient has history of Neuropathy Denies history of Dementia, Quadriplegia, Paraplegia, Seizure Disorder Oncologic Denies history of Received Chemotherapy, Received Radiation Psychiatric Denies history of Anorexia/bulimia, Confinement Anxiety Hospitalization/Surgery History - inpatient PE 06/2021. Medical A Surgical History Notes nd Respiratory PE September 2022 02 dependent at night Objective Constitutional respirations regular, non-labored and within target range for patient.. Vitals Time Taken: 10:17 AM, Height: 71 in, Weight: 330 lbs, BMI: 46, Temperature: 98.4 F, Pulse: 82 bpm, Respiratory Rate: 18 breaths/min, Blood Pressure: 148/78 mmHg, Capillary Blood Glucose: 185 mg/dl. Cardiovascular 2+ dorsalis pedis/posterior tibialis pulses. Psychiatric pleasant and cooperative. General Notes: Right lower extremity: Epithelialization to previous wound site. On the left lower extremity there is a small open wound to the anterior aspect limited to skin breakdown. No surrounding signs of infection. Integumentary (Hair, Skin) Wound #1 status is Healed - Epithelialized. Original cause of wound was Gradually Appeared. The date acquired was: 05/24/2021. The wound has been  in treatment 1 weeks. The wound is located on the Right,Posterior Lower Leg. The wound measures 0cm length x 0cm width x 0cm depth; 0cm^2 area and 0cm^3 volume. Wound #2 status is Open. Original cause of wound was Gradually Appeared. The date acquired was: 08/23/2021. The wound is located on the Left,Anterior Lower Leg. The wound measures 0.7cm length x 0.7cm width x 0.1cm depth; 0.385cm^2 area and 0.038cm^3 volume. There is Fat Layer (Subcutaneous Tissue) exposed. There is no  tunneling or undermining noted. There is a medium amount of serosanguineous drainage noted. The wound margin is distinct with the outline attached to the wound base. There is large (67-100%) red granulation within the wound bed. There is no necrotic tissue within the wound bed. 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 He now has an open wound to the anterior left lower extremity. No signs of infection. This was likely a blister that opened, noted from the previous clinic visit. We discussed continued compression therapy and he was agreeable to this. I asked him to bring his compression stockings to next clinic visit. I am hopeful he will be closed by then. Procedures Wound #2 Pre-procedure diagnosis of Wound #2 is a Venous Leg Ulcer located on the Left,Anterior Lower Leg . There was a Three Layer Compression Therapy Procedure by Lorrin Jackson, RN. Post procedure Diagnosis Wound #2: Same as Pre-Procedure There was a Three Layer Compression Therapy Procedure by Lorrin Jackson, RN. Post procedure Diagnosis Wound #: Same as Pre-Procedure Plan Follow-up Appointments: Return Appointment in 1 week. - Dr. Heber [place] Bring Compression Stockings to next visit. Bathing/ Shower/ Hygiene: May shower with protection but do not get wound dressing(s) wet. Edema Control - Lymphedema / SCD / Other: Elevate legs to the level of the heart or above for 30 minutes daily and/or when sitting, a frequency of: - 3-4 times a day throughout the day. Avoid standing for long periods of time. Exercise regularly Non  Wound Condition: Other Non Wound Condition Orders/Instructions: - Lotion/TCA -3 layer compression wrap to right leg WOUND #2: - Lower Leg Wound Laterality: Left, Anterior Cleanser: Soap and Water 1 x Per Week/ Discharge Instructions: May shower and wash wound with dial antibacterial soap and water prior to dressing change. Cleanser: Wound Cleanser 1 x Per Week/ Discharge Instructions: Cleanse the wound with wound cleanser prior to applying a clean dressing using gauze sponges, not tissue or cotton balls. Peri-Wound Care: Triamcinolone 15 (g) 1 x Per Week/ Discharge Instructions: Use triamcinolone 15 (g) as directed Peri-Wound Care: Sween Lotion (Moisturizing lotion) 1 x Per Week/ Discharge Instructions: Apply moisturizing lotion as directed Prim Dressing: KerraCel Ag Gelling Fiber Dressing, 2x2 in (silver alginate) 1 x Per Week/ ary Discharge Instructions: Apply silver alginate to wound bed as instructed Secondary Dressing: Woven Gauze Sponge, Non-Sterile 4x4 in 1 x Per Week/ Discharge Instructions: Apply over primary dressing as directed. Com pression Wrap: ThreePress (3 layer compression wrap) 1 x Per Week/ Discharge Instructions: Apply three layer compression as directed. 1. Bilateral 3 layer compression with silver alginate to the wound bed 2. Follow-up in 1 week Electronic Signature(s) Signed: 08/23/2021 11:34:48 AM By: [name] DO Entered By: [name] on 08/23/2021  11:33:59 -------------------------------------------------------------------------------- HxROS Details Patient Name: Date of Service: [name], [name] 08/23/2021 10:00 A M Medical Record Number: [id number] Patient Account Number: [id number] Date of Birth/Sex: Treating RN: 12/09/1960 (60 y.o. [name] Primary Care Provider: [name] Other Clinician: Referring Provider: Treating Provider/Extender: Veneda Melter Weeks in Treatment: 1 Unable to Obtain Patient History  due to Altered Mental Status Information Obtained From Patient Eyes Medical History: Negative for: Cataracts; Glaucoma; Optic Neuritis Ear/Nose/Mouth/Throat Medical History: Negative for: Chronic sinus problems/congestion; Middle ear problems Hematologic/Lymphatic Medical History: Negative for: Anemia; Hemophilia; Human Immunodeficiency Virus; Lymphedema; Sickle Cell Disease Respiratory Medical History: Negative for: Aspiration; Asthma; Chronic Obstructive Pulmonary Disease (COPD); Pneumothorax; Sleep Apnea; Tuberculosis Past Medical History Notes: PE September 2022 02 dependent at night Cardiovascular Medical History: Positive for: Congestive Heart Failure; Coronary Artery Disease; Deep Vein Thrombosis; Hypertension Negative for: Angina; Arrhythmia; Hypotension; Myocardial Infarction; Peripheral Arterial Disease; Peripheral Venous Disease; Phlebitis; Vasculitis Gastrointestinal Medical History: Negative for: Cirrhosis ; Colitis; Crohns; Hepatitis A; Hepatitis B; Hepatitis C Endocrine Medical History: Positive for: Type II Diabetes Negative for: Type I Diabetes Time with diabetes: 10 years Treated with: Insulin, Oral agents Blood sugar tested every day: Yes Tested : TID Genitourinary Medical History: Negative for: End Stage Renal Disease Immunological Medical History: Negative for: Lupus Erythematosus; Raynauds; Scleroderma Integumentary (Skin) Medical History: Negative for: History of Burn Musculoskeletal Medical History: Negative for: Gout; Rheumatoid Arthritis; Osteoarthritis; Osteomyelitis Neurologic Medical History: Positive for: Neuropathy Negative for: Dementia; Quadriplegia; Paraplegia; Seizure Disorder Oncologic Medical History: Negative for: Received Chemotherapy; Received Radiation Psychiatric Medical History: Negative for: Anorexia/bulimia; Confinement Anxiety Immunizations Pneumococcal Vaccine: Received Pneumococcal Vaccination: No Implantable  Devices None Hospitalization / Surgery History Type of Hospitalization/Surgery inpatient PE 06/2021 Family and Social History Cancer: Yes - Mother,Father; Diabetes: Yes - Mother,Siblings; Heart Disease: No; Hereditary Spherocytosis: No; Hypertension: Yes - Siblings,Mother; Kidney Disease: No; Lung Disease: No; Seizures: Yes - Mother,Siblings; Stroke: Yes - Siblings; Thyroid Problems: No; Tuberculosis: No; Never smoker; Marital Status - Divorced; Alcohol Use: Rarely; Drug Use: No History; Caffeine Use: Daily; Financial Concerns: No; Food, Clothing or Shelter Needs: No; Support System Lacking: No; Transportation Concerns: No Electronic Signature(s) Signed: 08/23/2021 11:34:48 AM By: [name] DO Signed: 08/23/2021 5:16:39 PM By: Lorrin Jackson Entered By: [name] on 08/23/2021 11:27:42 -------------------------------------------------------------------------------- SuperBill Details Patient Name: Date of Service: [name], MICKEY ESGUERRA 08/23/2021 Medical Record Number: [id number] Patient Account Number: [id number] Date of Birth/Sex: Treating RN: 05-03-1961 (60 y.o. [name] Primary Care Provider: [name] Other Clinician: Referring Provider: Treating Provider/Extender: Veneda Melter Weeks in Treatment: 1 Diagnosis Coding ICD-10 Codes Code Description [phone] Non-pressure chronic ulcer of other part of right lower leg limited to breakdown of skin L97.821 Non-pressure chronic ulcer of other part of left lower leg limited to breakdown of skin I89.0 Lymphedema, not elsewhere classified I87.2 Venous insufficiency (chronic) (peripheral) E11.59 Type 2 diabetes mellitus with other circulatory complications J62.83 Chronic diastolic (congestive) heart failure Facility Procedures CPT4: Code [id number] Description: 81 BILATERAL: Application of multi-layer venous compression system; leg (below knee), including ankle and t. ICD-10 Diagnosis  Description L97.811 Non-pressure chronic ulcer of other part of right lower leg limited to breakdown of skin I89.0  Lymphedema, not elsewhere classified Modifier: Quantity: 1 Physician Procedures : CPT4 Code Description Modifier [id number] - WC PHYS LEVEL 3 - EST PT ICD-10 Diagnosis Description L97.811 Non-pressure chronic ulcer of other part of right lower leg limited to breakdown of skin L97.821 Non-pressure chronic ulcer of  other part of  left lower leg limited to breakdown of skin I89.0 Lymphedema, not elsewhere classified I87.2 Venous insufficiency (chronic) (peripheral) Quantity: 1 Electronic Signature(s) Signed: 08/23/2021 11:34:48 AM By: [name] DO Entered By: [name] on 08/23/2021 11:34:18

## 2021-08-23 NOTE — Progress Notes (Signed)
ALGIE, WESTRY ([id number]) Visit Report for 08/23/2021 Arrival Information Details Patient Name: Date of Service: [name], [name] 08/23/2021 10:00 A M Medical Record Number: [id number] Patient Account Number: [id number] Date of Birth/Sex: Treating RN: 1961/07/21 (61 y.o. [name] Primary Care Srinidhi Landers: [name] Other Clinician: Referring Romelo Sciandra: Treating Shamikia Linskey/Extender: Veneda Melter Weeks in Treatment: 1 Visit Information History Since Last Visit Added or deleted any medications: No Patient Arrived: Ambulatory Any new allergies or adverse reactions: No Arrival Time: 10:17 Had a fall or experienced change in No Transfer Assistance: None activities of daily living that may affect Patient Identification Verified: Yes risk of falls: Secondary Verification Process Completed: Yes Signs or symptoms of abuse/neglect since last visito No Patient Requires Transmission-Based Precautions: No Hospitalized since last visit: No Patient Has Alerts: Yes Implantable device outside of the clinic excluding No Patient Alerts: Patient on Blood Thinner cellular tissue based products placed in the center since last visit: Has Dressing in Place as Prescribed: Yes Has Compression in Place as Prescribed: Yes Pain Present Now: No Electronic Signature(s) Signed: 08/23/2021 5:16:39 PM By: Lorrin Jackson Entered By: Lorrin Jackson on 08/23/2021 10:18:13 -------------------------------------------------------------------------------- Compression Therapy Details Patient Name: Date of Service: [name], [name] 08/23/2021 10:00 A M Medical Record Number: [id number] Patient Account Number: [id number] Date of Birth/Sex: Treating RN: 1961-01-19 (60 y.o. [name] Primary Care Tanazia Achee: [name] Other Clinician: Referring Adiya Selmer: Treating Amerie Beaumont/Extender: Veneda Melter Weeks in Treatment: 1 Compression Therapy Performed for  Wound Assessment: Wound #2 Left,Anterior Lower Leg Performed By: Clinician Lorrin Jackson, RN Compression Type: Three Layer Post Procedure Diagnosis Same as Pre-procedure Electronic Signature(s) Signed: 08/23/2021 5:16:39 PM By: Lorrin Jackson Entered By: Lorrin Jackson on 08/23/2021 10:57:34 -------------------------------------------------------------------------------- Compression Therapy Details Patient Name: Date of Service: [name], [name] 08/23/2021 10:00 A M Medical Record Number: [id number] Patient Account Number: [id number] Date of Birth/Sex: Treating RN: 1961-06-11 (60 y.o. [name] Primary Care Jeorge Reister: [name] Other Clinician: Referring Francee Setzer: Treating Shanesha Bednarz/Extender: Veneda Melter Weeks in Treatment: 1 Compression Therapy Performed for Wound Assessment: NonWound Condition Lymphedema - Right Leg Performed By: Clinician Lorrin Jackson, RN Compression Type: Three Layer Post Procedure Diagnosis Same as Pre-procedure Electronic Signature(s) Signed: 08/23/2021 5:16:39 PM By: Lorrin Jackson Entered By: Lorrin Jackson on 08/23/2021 10:57:45 -------------------------------------------------------------------------------- Encounter Discharge Information Details Patient Name: Date of Service: [name], [name] 08/23/2021 10:00 A M Medical Record Number: [id number] Patient Account Number: [id number] Date of Birth/Sex: Treating RN: [birth year] (60 y.o. [name] Primary Care Lynnzie Blackson: [name] Other Clinician: Referring Sedric Guia: Treating Antwon Rochin/Extender: Veneda Melter Weeks in Treatment: 1 Encounter Discharge Information Items Discharge Condition: Stable Ambulatory Status: Ambulatory Discharge Destination: Home Transportation: Private Auto Schedule Follow-up Appointment: Yes Clinical Summary of Care: Provided on 08/23/2021 Form Type Recipient Paper Patient Patient Electronic  Signature(s) Signed: 08/23/2021 5:16:39 PM By: Lorrin Jackson Entered By: Lorrin Jackson on 08/23/2021 11:17:15 -------------------------------------------------------------------------------- Lower Extremity Assessment Details Patient Name: Date of Service: [name], [name] 08/23/2021 10:00 A M Medical Record Number: [id number] Patient Account Number: [id number] Date of Birth/Sex: Treating RN: 1961/05/15 (60 y.o. [name] Primary Care Leonidus Rowand: [name] Other Clinician: Referring Emmanuel Ercole: Treating Delrae Hagey/Extender: Veneda Melter Weeks in Treatment: 1 Edema Assessment Assessed: [Left: Yes] [Right: Yes] Edema: [Left: Yes] [Right: Yes] Calf Left: Right: Point of Measurement: 34 cm From Medial Instep 43.5 cm 45 cm Ankle Left: Right: Point of Measurement: 12 cm From Medial Instep 24 cm 26  cm Vascular Assessment Pulses: Dorsalis Pedis Palpable: [Left:Yes] [Right:Yes] Electronic Signature(s) Signed: 08/23/2021 5:16:39 PM By: Lorrin Jackson Entered By: Lorrin Jackson on 08/23/2021 10:23:54 -------------------------------------------------------------------------------- Multi Wound Chart Details Patient Name: Date of Service: [name], Ryleigh L. 08/23/2021 10:00 A M Medical Record Number: [id number] Patient Account Number: [id number] Date of Birth/Sex: Treating RN: October 05, 1961 (60 y.o. [name] Primary Care Aakash Hollomon: [name] Other Clinician: Referring Chrishon Martino: Treating Tashaun Obey/Extender: Veneda Melter Weeks in Treatment: 1 Vital Signs Height(in): 71 Capillary Blood Glucose(mg/dl): 185 Weight(lbs): 330 Pulse(bpm): 90 Body Mass Index(BMI): 43 Blood Pressure(mmHg): 148/78 Temperature(F): 98.4 Respiratory Rate(breaths/min): 18 Photos: [N/A:N/A] Right, Posterior Lower Leg Left, Anterior Lower Leg N/A Wound Location: Gradually Appeared Gradually Appeared N/A Wounding Event: Lymphedema Venous Leg Ulcer  N/A Primary Etiology: Congestive Heart Failure, Coronary Congestive Heart Failure, Coronary N/A Comorbid History: Artery Disease, Deep Vein Artery Disease, Deep Vein Thrombosis, Hypertension, Type II Thrombosis, Hypertension, Type II Diabetes, Neuropathy Diabetes, Neuropathy 05/24/2021 08/23/2021 N/A Date Acquired: 1 0 N/A Weeks of Treatment: Healed - Epithelialized Open N/A Wound Status: 0x0x0 0.7x0.7x0.1 N/A Measurements L x W x D (cm) 0 0.385 N/A A (cm) : rea 0 0.038 N/A Volume (cm) : 100.00% 0.00% N/A % Reduction in Area: 100.00% 0.00% N/A % Reduction in Volume: Full Thickness Without Exposed Full Thickness Without Exposed N/A Classification: Support Structures Support Structures N/A Medium N/A Exudate Amount: N/A Serosanguineous N/A Exudate Type: N/A red, brown N/A Exudate Color: N/A Distinct, outline attached N/A Wound Margin: N/A Large (67-100%) N/A Granulation Amount: N/A Red N/A Granulation Quality: N/A None Present (0%) N/A Necrotic Amount: N/A Compression Therapy N/A Procedures Performed: Treatment Notes Wound #2 (Lower Leg) Wound Laterality: Left, Anterior Cleanser Soap and Water Discharge Instruction: May shower and wash wound with dial antibacterial soap and water prior to dressing change. Wound Cleanser Discharge Instruction: Cleanse the wound with wound cleanser prior to applying a clean dressing using gauze sponges, not tissue or cotton balls. Peri-Wound Care Triamcinolone 15 (g) Discharge Instruction: Use triamcinolone 15 (g) as directed Sween Lotion (Moisturizing lotion) Discharge Instruction: Apply moisturizing lotion as directed Topical Primary Dressing KerraCel Ag Gelling Fiber Dressing, 2x2 in (silver alginate) Discharge Instruction: Apply silver alginate to wound bed as instructed Secondary Dressing Woven Gauze Sponge, Non-Sterile 4x4 in Discharge Instruction: Apply over primary dressing as directed. Secured With Compression  Wrap ThreePress (3 layer compression wrap) Discharge Instruction: Apply three layer compression as directed. Compression Stockings Add-Ons Electronic Signature(s) Signed: 08/23/2021 11:34:48 AM By: [name] DO Signed: 08/23/2021 5:16:39 PM By: Fara Chute By: [name] on 08/23/2021 11:22:23 -------------------------------------------------------------------------------- Multi-Disciplinary Care Plan Details Patient Name: Date of Service: [name], Jossiah L. 08/23/2021 10:00 A M Medical Record Number: [id number] Patient Account Number: [id number] Date of Birth/Sex: Treating RN: [birth year] (60 y.o. [name] Primary Care Marlis Oldaker: [name] Other Clinician: Referring Shenekia Riess: Treating Torry Istre/Extender: Veneda Melter Weeks in Treatment: 1 Active Inactive Orientation to the Wound Care Program Nursing Diagnoses: Knowledge deficit related to the wound healing center program Goals: Patient/caregiver will verbalize understanding of the [facility] Date Initiated: 08/16/2021 Target Resolution Date: 08/27/2021 Goal Status: Active Interventions: Provide education on orientation to the wound center Notes: Wound/Skin Impairment Nursing Diagnoses: Knowledge deficit related to ulceration/compromised skin integrity Goals: Patient/caregiver will verbalize understanding of skin care regimen Date Initiated: 08/16/2021 Target Resolution Date: 08/27/2021 Goal Status: Active Interventions: Assess patient/caregiver ability to perform ulcer/skin care regimen upon admission and as needed Assess ulceration(s) every visit Provide education on ulcer  and skin care Treatment Activities: Skin care regimen initiated : 08/16/2021 Topical wound management initiated : 08/16/2021 Notes: Electronic Signature(s) Signed: 08/23/2021 10:35:11 AM By: Lorrin Jackson Entered By: Lorrin Jackson on 08/23/2021  10:35:11 -------------------------------------------------------------------------------- Pain Assessment Details Patient Name: Date of Service: [name], [name] 08/23/2021 10:00 [place] Record Number: [id number] Patient Account Number: [id number] Date of Birth/Sex: Treating RN: Jan 16, 1961 (60 y.o. [name] Primary Care Dalary Hollar: [name] Other Clinician: Referring Nimrod Wendt: Treating Cincere Zorn/Extender: Veneda Melter Weeks in Treatment: 1 Active Problems Location of Pain Severity and Description of Pain Patient Has Paino No Site Locations Pain Management and Medication Current Pain Management: Electronic Signature(s) Signed: 08/23/2021 5:16:39 PM By: Lorrin Jackson Entered By: Lorrin Jackson on 08/23/2021 10:19:46 -------------------------------------------------------------------------------- Patient/Caregiver Education Details Patient Name: Date of Service: [name], [name] 10/31/2022andnbsp10:00 A M Medical Record Number: [id number] Patient Account Number: [id number] Date of Birth/Gender: Treating RN: [birth year] (61 y.o. [name] Primary Care Physician: [name] Other Clinician: Referring Physician: Treating Physician/Extender: Veneda Melter Weeks in Treatment: 1 Education Assessment Education Provided To: Patient Education Topics Provided Venous: Methods: Explain/Verbal, Printed Responses: State content correctly Wound/Skin Impairment: Methods: Explain/Verbal, Printed Responses: State content correctly Electronic Signature(s) Signed: 08/23/2021 5:16:39 PM By: Lorrin Jackson Entered By: Lorrin Jackson on 08/23/2021 10:35:33 -------------------------------------------------------------------------------- Wound Assessment Details Patient Name: Date of Service: [name], [name] 08/23/2021 10:00 A M Medical Record Number: [id number] Patient Account Number: [id number] Date of  Birth/Sex: Treating RN: 1961/06/18 (60 y.o. [name] Primary Care Bennette Hasty: [name] Other Clinician: Referring Lizzie Cokley: Treating Aleigha Gilani/Extender: Veneda Melter Weeks in Treatment: 1 Wound Status Wound Number: 1 Primary Lymphedema Etiology: Wound Location: Right, Posterior Lower Leg Wound Healed - Epithelialized Wounding Event: Gradually Appeared Status: Date Acquired: 05/24/2021 Comorbid Congestive Heart Failure, Coronary Artery Disease, Deep Vein Weeks Of Treatment: 1 History: Thrombosis, Hypertension, Type II Diabetes, Neuropathy Clustered Wound: No Photos Wound Measurements Length: (cm) Width: (cm) Depth: (cm) Area: (cm) Volume: (cm) 0 % Reduction in Area: 100% 0 % Reduction in Volume: 100% 0 0 0 Wound Description Classification: Full Thickness Without Exposed Support Structur es Electronic Signature(s) Signed: 08/23/2021 5:16:39 PM By: Lorrin Jackson Entered By: Lorrin Jackson on 08/23/2021 10:56:24 -------------------------------------------------------------------------------- Wound Assessment Details Patient Name: Date of Service: [name], [name] 08/23/2021 10:00 A M Medical Record Number: [id number] Patient Account Number: [id number] Date of Birth/Sex: Treating RN: [birth year] (60 y.o. [name] Primary Care Louise Victory: [name] Other Clinician: Referring Aalaya Yadao: Treating Jaxon Mynhier/Extender: Veneda Melter Weeks in Treatment: 1 Wound Status Wound Number: 2 Primary Venous Leg Ulcer Etiology: Wound Location: Left, Anterior Lower Leg Wound Open Wounding Event: Gradually Appeared Status: Date Acquired: 08/23/2021 Comorbid Congestive Heart Failure, Coronary Artery Disease, Deep Vein Weeks Of Treatment: 0 History: Thrombosis, Hypertension, Type II Diabetes, Neuropathy Clustered Wound: No Photos Wound Measurements Length: (cm) 0.7 Width: (cm) 0.7 Depth: (cm) 0.1 Area: (cm)  0.385 Volume: (cm) 0.038 Wound Description Classification: Full Thickness Without Exposed Support Structures Wound Margin: Distinct, outline attached Exudate Amount: Medium Exudate Type: Serosanguineous Exudate Color: red, brown Foul Odor After Cleansing: Slough/Fibrino % Reduction in Area: 0% % Reduction in Volume: 0% Tunneling: No Undermining: No No No Wound Bed Granulation Amount: Large (67-100%) Exposed Structure Granulation Quality: Red Fascia Exposed: No Necrotic Amount: None Present (0%) Fat Layer (Subcutaneous Tissue) Exposed: Yes Tendon Exposed: No Muscle Exposed: No Joint Exposed: No Bone Exposed: No Treatment Notes Wound #2 (Lower Leg) Wound Laterality: Left, Anterior Cleanser  Soap and Water Discharge Instruction: May shower and wash wound with dial antibacterial soap and water prior to dressing change. Wound Cleanser Discharge Instruction: Cleanse the wound with wound cleanser prior to applying a clean dressing using gauze sponges, not tissue or cotton balls. Peri-Wound Care Triamcinolone 15 (g) Discharge Instruction: Use triamcinolone 15 (g) as directed Sween Lotion (Moisturizing lotion) Discharge Instruction: Apply moisturizing lotion as directed Topical Primary Dressing KerraCel Ag Gelling Fiber Dressing, 2x2 in (silver alginate) Discharge Instruction: Apply silver alginate to wound bed as instructed Secondary Dressing Woven Gauze Sponge, Non-Sterile 4x4 in Discharge Instruction: Apply over primary dressing as directed. Secured With Compression Wrap ThreePress (3 layer compression wrap) Discharge Instruction: Apply three layer compression as directed. Compression Stockings Add-Ons Electronic Signature(s) Signed: 08/23/2021 5:16:39 PM By: Lorrin Jackson Entered By: Lorrin Jackson on 08/23/2021 10:57:13 -------------------------------------------------------------------------------- Vitals Details Patient Name: Date of Service: [name], Juan  L. 08/23/2021 10:00 A M Medical Record Number: [id number] Patient Account Number: [id number] Date of Birth/Sex: Treating RN: Sep 14, 1961 (60 y.o. [name] Primary Care Brantley Wiley: [name] Other Clinician: Referring Tanaia Hawkey: Treating Ilyana Manuele/Extender: Veneda Melter Weeks in Treatment: 1 Vital Signs Time Taken: 10:17 Temperature (F): 98.4 Height (in): 71 Pulse (bpm): 82 Weight (lbs): 330 Respiratory Rate (breaths/min): 18 Body Mass Index (BMI): 46 Blood Pressure (mmHg): 148/78 Capillary Blood Glucose (mg/dl): 185 Reference Range: 80 - 120 mg / dl Electronic Signature(s) Signed: 08/23/2021 5:16:39 PM By: Lorrin Jackson Entered By: Lorrin Jackson on 08/23/2021 10:19:40

## 2021-08-30 ENCOUNTER — Encounter (HOSPITAL_BASED_OUTPATIENT_CLINIC_OR_DEPARTMENT_OTHER): Payer: Medicaid Other | Attending: Internal Medicine | Admitting: Internal Medicine

## 2021-08-30 ENCOUNTER — Other Ambulatory Visit: Payer: Self-pay

## 2021-08-30 DIAGNOSIS — E1151 Type 2 diabetes mellitus with diabetic peripheral angiopathy without gangrene: Secondary | ICD-10-CM | POA: Insufficient documentation

## 2021-08-30 DIAGNOSIS — E11622 Type 2 diabetes mellitus with other skin ulcer: Secondary | ICD-10-CM | POA: Insufficient documentation

## 2021-08-30 DIAGNOSIS — L97811 Non-pressure chronic ulcer of other part of right lower leg limited to breakdown of skin: Secondary | ICD-10-CM | POA: Insufficient documentation

## 2021-08-30 DIAGNOSIS — L97821 Non-pressure chronic ulcer of other part of left lower leg limited to breakdown of skin: Secondary | ICD-10-CM | POA: Insufficient documentation

## 2021-08-30 DIAGNOSIS — I5032 Chronic diastolic (congestive) heart failure: Secondary | ICD-10-CM | POA: Diagnosis not present

## 2021-08-30 DIAGNOSIS — I890 Lymphedema, not elsewhere classified: Secondary | ICD-10-CM | POA: Insufficient documentation

## 2021-08-30 DIAGNOSIS — I110 Hypertensive heart disease with heart failure: Secondary | ICD-10-CM | POA: Diagnosis not present

## 2021-08-30 DIAGNOSIS — I872 Venous insufficiency (chronic) (peripheral): Secondary | ICD-10-CM | POA: Insufficient documentation

## 2021-08-31 ENCOUNTER — Telehealth (HOSPITAL_COMMUNITY): Payer: Self-pay

## 2021-08-31 NOTE — Progress Notes (Signed)
DELL, BRINER ([id number]) Visit Report for 08/30/2021 Chief Complaint Document Details Patient Name: Date of Service: [name], [name] 08/30/2021 8:00 A M Medical Record Number: [id number] Patient Account Number: [id number] Date of Birth/Sex: Treating RN: [birth year] (60 y.o. [name] Primary Care Provider: [name] Other Clinician: Referring Provider: Treating Provider/Extender: Veneda Melter Weeks in Treatment: 2 Information Obtained from: Patient Chief Complaint 08/16/2021: Bilateral lower extremity scattered blisters. 1 small open wound limited to skin breakdown on the right lower extremity. 08/23/2021; 1 small open wound limited to skin breakdown on the left lower extremity Electronic Signature(s) Signed: 08/30/2021 10:29:24 AM By: [name] DO Entered By: [name] on 08/30/2021 10:24:29 -------------------------------------------------------------------------------- HPI Details Patient Name: Date of Service: [name], [place] 08/30/2021 8:00 A M Medical Record Number: [id number] Patient Account Number: [id number] Date of Birth/Sex: Treating RN: [birth year] (60 y.o. [name] Primary Care Provider: [name] Other Clinician: Referring Provider: Treating Provider/Extender: Veneda Melter Weeks in Treatment: 2 History of Present Illness HPI Description: Admission 08/16/2021 Mr. Lemmie Vanlanen is a 60 year old male with a past medical history of CVA, hypertension, uncontrolled type 2 diabetes on insulin complicated by peripheral neuropathy, Chronic venous insufficiency/lymphedema, diastolic CHF, and left lower extremity DVT that presents to clinic today for an open wound to his right lower extremity. He reports an ongoing issue with blistering to his legs and weeping that wax and wane over the past 6 months. He was previously seen in our clinic in June 2022 however he had closed wounds at that time. He  reported that his diuretic was increased then and helped with symptom control. About 2 months ago he developed increased blistering and swelling again. He reports wearing 20/30 compression stockings however it is unclear if he is doing this daily since he reports the stockings are very difficult to put on. He currently denies signs of infection. 10/31; patient presents for follow-up. He has tolerated the compression wraps well. The right leg wound has healed. He does have a small open wound now present on the left anterior lower extremity limited to skin breakdown. He would like to continue wrapping both legs. He states he cannot afford juxta lite compression and would like to use his compression stockings he currently owns. He denies signs of infection. 11/7; patient presents for follow-up. He has no issues or complaints today. While in office he scratched With his fingers at his right leg and now has a small open wound. He continues to have a left lower extremity wound. He tolerated the wraps well. He has his compression stockings today. He denies signs of infection. Electronic Signature(s) Signed: 08/30/2021 10:29:24 AM By: [name] DO Entered By: [name] on 08/30/2021 10:26:22 -------------------------------------------------------------------------------- Physical Exam Details Patient Name: Date of Service: [name], [name] 08/30/2021 8:00 A M Medical Record Number: [id number] Patient Account Number: [id number] Date of Birth/Sex: Treating RN: 1961-05-21 (60 y.o. [name] Primary Care Provider: [name] Other Clinician: Referring Provider: Treating Provider/Extender: Veneda Melter Weeks in Treatment: 2 Constitutional respirations regular, non-labored and within target range for patient.. Cardiovascular 2+ dorsalis pedis/posterior tibialis pulses. Psychiatric pleasant and cooperative. Notes Right lower extremity: Small open wound  limited to skin breakdown to the lower medial aspect. Left lower extremity: Small open wound to the anterior aspect limited to skin breakdown. No surrounding signs of infection. Electronic Signature(s) Signed: 08/30/2021 10:29:24 AM By: [name] DO Entered By: [name] on 08/30/2021 10:27:07 -------------------------------------------------------------------------------- Physician Orders  Details Patient Name: Date of Service: Juan, Stein 08/30/2021 8:00 A M Medical Record Number: [id number] Patient Account Number: [id number] Date of Birth/Sex: Treating RN: September 21, 1961 (60 y.o. [name] Primary Care Provider: [name] Other Clinician: Referring Provider: Treating Provider/Extender: Veneda Melter Weeks in Treatment: 2 Verbal / Phone Orders: No Diagnosis Coding ICD-10 Coding Code Description L97.811 Non-pressure chronic ulcer of other part of right lower leg limited to breakdown of skin L97.821 Non-pressure chronic ulcer of other part of left lower leg limited to breakdown of skin I89.0 Lymphedema, not elsewhere classified I87.2 Venous insufficiency (chronic) (peripheral) E11.59 Type 2 diabetes mellitus with other circulatory complications Q76.22 Chronic diastolic (congestive) heart failure Follow-up Appointments ppointment in 1 week. - Dr. Heber [place] Return A Other: - bring compression stockings to appointment next week Bathing/ Shower/ Hygiene May shower with protection but do not get wound dressing(s) wet. Edema Control - Lymphedema / SCD / Other Elevate legs to the level of the heart or above for 30 minutes daily and/or when sitting, a frequency of: - 3-4 times a day throughout the day. Avoid standing for long periods of time. Exercise regularly Non Wound Condition Right Lower Extremity Other Non Wound Condition Orders/Instructions: - Lotion/TCA -3 layer compression wrap to right leg Wound Treatment Wound #2 - Lower Leg Wound  Laterality: Left, Anterior Cleanser: Soap and Water 1 x Per Week Discharge Instructions: May shower and wash wound with dial antibacterial soap and water prior to dressing change. Cleanser: Wound Cleanser 1 x Per Week Discharge Instructions: Cleanse the wound with wound cleanser prior to applying a clean dressing using gauze sponges, not tissue or cotton balls. Peri-Wound Care: Triamcinolone 15 (g) 1 x Per Week Discharge Instructions: Use triamcinolone 15 (g) as directed Peri-Wound Care: Sween Lotion (Moisturizing lotion) 1 x Per Week Discharge Instructions: Apply moisturizing lotion as directed Prim Dressing: KerraCel Ag Gelling Fiber Dressing, 2x2 in (silver alginate) 1 x Per Week ary Discharge Instructions: Apply silver alginate to wound bed as instructed Secondary Dressing: Woven Gauze Sponge, Non-Sterile 4x4 in 1 x Per Week Discharge Instructions: Apply over primary dressing as directed. Compression Wrap: ThreePress (3 layer compression wrap) 1 x Per Week Discharge Instructions: Apply three layer compression as directed. Electronic Signature(s) Signed: 08/30/2021 10:29:24 AM By: [name] DO Entered By: [name] on 08/30/2021 10:27:32 -------------------------------------------------------------------------------- Problem List Details Patient Name: Date of Service: [name], [place] 08/30/2021 8:00 A M Medical Record Number: [id number] Patient Account Number: [id number] Date of Birth/Sex: Treating RN: [birth year] (60 y.o. [name] Primary Care Provider: [name] Other Clinician: Referring Provider: Treating Provider/Extender: Veneda Melter Weeks in Treatment: 2 Active Problems ICD-10 Encounter Code Description Active Date MDM Diagnosis L97.811 Non-pressure chronic ulcer of other part of right lower leg limited to breakdown 08/16/2021 No Yes of skin L97.821 Non-pressure chronic ulcer of other part of left lower leg limited to  breakdown 08/23/2021 No Yes of skin I89.0 Lymphedema, not elsewhere classified 08/16/2021 No Yes I87.2 Venous insufficiency (chronic) (peripheral) 08/16/2021 No Yes E11.59 Type 2 diabetes mellitus with other circulatory complications 56/38/9373 No Yes S28.76 Chronic diastolic (congestive) heart failure 08/16/2021 No Yes Inactive Problems Resolved Problems Electronic Signature(s) Signed: 08/30/2021 10:29:24 AM By: [name] DO Entered By: [name] on 08/30/2021 10:24:05 -------------------------------------------------------------------------------- Progress Note Details Patient Name: Date of Service: [name], [place] 08/30/2021 8:00 A M Medical Record Number: [id number] Patient Account Number: [id number] Date of Birth/Sex: Treating RN: December 01, 1960 (60 y.o. [name] Primary  Care Provider: [name] Other Clinician: Referring Provider: Treating Provider/Extender: Veneda Melter Weeks in Treatment: 2 Subjective Chief Complaint Information obtained from Patient 08/16/2021: Bilateral lower extremity scattered blisters. 1 small open wound limited to skin breakdown on the right lower extremity. 08/23/2021; 1 small open wound limited to skin breakdown on the left lower extremity History of Present Illness (HPI) Admission 08/16/2021 Mr. Adriell Polansky is a 60 year old male with a past medical history of CVA, hypertension, uncontrolled type 2 diabetes on insulin complicated by peripheral neuropathy, Chronic venous insufficiency/lymphedema, diastolic CHF, and left lower extremity DVT that presents to clinic today for an open wound to his right lower extremity. He reports an ongoing issue with blistering to his legs and weeping that wax and wane over the past 6 months. He was previously seen in our clinic in June 2022 however he had closed wounds at that time. He reported that his diuretic was increased then and helped with symptom control. About  2 months ago he developed increased blistering and swelling again. He reports wearing 20/30 compression stockings however it is unclear if he is doing this daily since he reports the stockings are very difficult to put on. He currently denies signs of infection. 10/31; patient presents for follow-up. He has tolerated the compression wraps well. The right leg wound has healed. He does have a small open wound now present on the left anterior lower extremity limited to skin breakdown. He would like to continue wrapping both legs. He states he cannot afford juxta lite compression and would like to use his compression stockings he currently owns. He denies signs of infection. 11/7; patient presents for follow-up. He has no issues or complaints today. While in office he scratched With his fingers at his right leg and now has a small open wound. He continues to have a left lower extremity wound. He tolerated the wraps well. He has his compression stockings today. He denies signs of infection. Patient History Unable to Obtain Patient History due to Altered Mental Status. Information obtained from Patient. Family History Cancer - Mother,Father, Diabetes - Mother,Siblings, Hypertension - Siblings,Mother, Seizures - Mother,Siblings, Stroke - Siblings, No family history of Heart Disease, Hereditary Spherocytosis, Kidney Disease, Lung Disease, Thyroid Problems, Tuberculosis. Social History Never smoker, Marital Status - Divorced, Alcohol Use - Rarely, Drug Use - No History, Caffeine Use - Daily. Medical History Eyes Denies history of Cataracts, Glaucoma, Optic Neuritis Ear/Nose/Mouth/Throat Denies history of Chronic sinus problems/congestion, Middle ear problems Hematologic/Lymphatic Denies history of Anemia, Hemophilia, Human Immunodeficiency Virus, Lymphedema, Sickle Cell Disease Respiratory Denies history of Aspiration, Asthma, Chronic Obstructive Pulmonary Disease (COPD), Pneumothorax, Sleep Apnea,  Tuberculosis Cardiovascular Patient has history of Congestive Heart Failure, Coronary Artery Disease, Deep Vein Thrombosis, Hypertension Denies history of Angina, Arrhythmia, Hypotension, Myocardial Infarction, Peripheral Arterial Disease, Peripheral Venous Disease, Phlebitis, Vasculitis Gastrointestinal Denies history of Cirrhosis , Colitis, Crohnoos, Hepatitis A, Hepatitis B, Hepatitis C Endocrine Patient has history of Type II Diabetes Denies history of Type I Diabetes Genitourinary Denies history of End Stage Renal Disease Immunological Denies history of Lupus Erythematosus, Raynaudoos, Scleroderma Integumentary (Skin) Denies history of History of Burn Musculoskeletal Denies history of Gout, Rheumatoid Arthritis, Osteoarthritis, Osteomyelitis Neurologic Patient has history of Neuropathy Denies history of Dementia, Quadriplegia, Paraplegia, Seizure Disorder Oncologic Denies history of Received Chemotherapy, Received Radiation Psychiatric Denies history of Anorexia/bulimia, Confinement Anxiety Hospitalization/Surgery History - inpatient PE 06/2021. Medical A Surgical History Notes nd Respiratory PE September 2022 02 dependent at night Objective Constitutional respirations regular, non-labored  and within target range for patient.. Vitals Time Taken: 8:09 AM, Height: 71 in, Weight: 330 lbs, BMI: 46, Temperature: 98.4 F, Pulse: 74 bpm, Respiratory Rate: 18 breaths/min, Blood Pressure: 134/76 mmHg. Cardiovascular 2+ dorsalis pedis/posterior tibialis pulses. Psychiatric pleasant and cooperative. General Notes: Right lower extremity: Small open wound limited to skin breakdown to the lower medial aspect. Left lower extremity: Small open wound to the anterior aspect limited to skin breakdown. No surrounding signs of infection. Integumentary (Hair, Skin) Wound #2 status is Open. Original cause of wound was Gradually Appeared. The date acquired was: 08/23/2021. The wound has been  in treatment 1 weeks. The wound is located on the Left,Anterior Lower Leg. The wound measures 0.7cm length x 0.5cm width x 0.1cm depth; 0.275cm^2 area and 0.027cm^3 volume. There is Fat Layer (Subcutaneous Tissue) exposed. There is no tunneling or undermining noted. There is a medium amount of serosanguineous drainage noted. The wound margin is distinct with the outline attached to the wound base. There is large (67-100%) red granulation within the wound bed. There is no necrotic tissue within the wound bed. 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 There were no signs of infection. He now has a small open wound limited to skin breakdown on his right lower extremity that happened while in office. I recommended continuing compression wraps as done previously with silver alginate. I am hopeful he will be closed in the next week. I asked him to bring his compression stockings again next week. Procedures Wound #2 Pre-procedure diagnosis of Wound #2 is a Venous Leg Ulcer located on the Left,Anterior Lower Leg . There was a Three Layer Compression Therapy Procedure by Baruch Gouty, RN. Post procedure Diagnosis Wound #2: Same as Pre-Procedure There was a Three Layer Compression Therapy Procedure by Baruch Gouty, RN. Post procedure Diagnosis Wound #: Same as Pre-Procedure Plan Follow-up Appointments: Return Appointment in 1 week. - Dr. Heber [place] Other: - bring compression stockings to appointment next week Bathing/ Shower/ Hygiene: May shower  with protection but do not get wound dressing(s) wet. Edema Control - Lymphedema / SCD / Other: Elevate legs to the level of the heart or above for 30 minutes daily and/or when sitting, a frequency of: - 3-4 times a day throughout the day. Avoid standing for long periods of time. Exercise regularly Non Wound Condition: Other Non Wound Condition Orders/Instructions: - Lotion/TCA -3 layer compression wrap to right leg WOUND #2: - Lower Leg Wound Laterality: Left, Anterior Cleanser: Soap and Water 1 x Per Week/ Discharge Instructions: May shower and wash wound with dial antibacterial soap and water prior to dressing change. Cleanser: Wound Cleanser 1 x Per Week/ Discharge Instructions: Cleanse the wound with wound cleanser prior to applying a clean dressing using gauze sponges, not tissue or cotton balls. Peri-Wound Care: Triamcinolone 15 (g) 1 x Per Week/ Discharge Instructions: Use triamcinolone 15 (g) as directed Peri-Wound Care: Sween Lotion (Moisturizing lotion) 1 x Per Week/ Discharge Instructions: Apply moisturizing lotion as directed Prim Dressing: KerraCel Ag Gelling Fiber Dressing, 2x2 in (silver alginate) 1 x Per Week/ ary Discharge Instructions: Apply silver alginate to wound bed as instructed Secondary Dressing: Woven  Gauze Sponge, Non-Sterile 4x4 in 1 x Per Week/ Discharge Instructions: Apply over primary dressing as directed. Com pression Wrap: ThreePress (3 layer compression wrap) 1 x Per Week/ Discharge Instructions: Apply three layer compression as directed. 1. Silver alginate under 3- layer compression 2. Follow-up in 1 week Electronic Signature(s) Signed: 08/30/2021 10:29:24 AM By: [name] DO Entered By: [name] on 08/30/2021 10:28:45 -------------------------------------------------------------------------------- HxROS Details Patient Name: Date of Service: [name], [place] 08/30/2021 8:00 A M Medical Record Number: [id number] Patient Account  Number: [id number] Date of Birth/Sex: Treating RN: Jul 22, 1961 (60 y.o. [name] Primary Care Provider: [name] Other Clinician: Referring Provider: Treating Provider/Extender: Veneda Melter Weeks in Treatment: 2 Unable to Obtain Patient History due to Altered Mental Status Information Obtained From Patient Eyes Medical History: Negative for: Cataracts; Glaucoma; Optic Neuritis Ear/Nose/Mouth/Throat Medical History: Negative for: Chronic sinus problems/congestion; Middle ear problems Hematologic/Lymphatic Medical History: Negative for: Anemia; Hemophilia; Human Immunodeficiency Virus; Lymphedema; Sickle Cell Disease Respiratory Medical History: Negative for: Aspiration; Asthma; Chronic Obstructive Pulmonary Disease (COPD); Pneumothorax; Sleep Apnea; Tuberculosis Past Medical History Notes: PE September 2022 02 dependent at night Cardiovascular Medical History: Positive for: Congestive Heart Failure; Coronary Artery Disease; Deep Vein Thrombosis; Hypertension Negative for: Angina; Arrhythmia; Hypotension; Myocardial Infarction; Peripheral Arterial Disease; Peripheral Venous Disease; Phlebitis; Vasculitis Gastrointestinal Medical History: Negative for: Cirrhosis ; Colitis; Crohns; Hepatitis A; Hepatitis B; Hepatitis C Endocrine Medical History: Positive for: Type II Diabetes Negative for: Type I Diabetes Time with diabetes: 10 years Treated with: Insulin, Oral agents Blood sugar tested every day: Yes Tested : TID Genitourinary Medical History: Negative for: End Stage Renal Disease Immunological Medical History: Negative for: Lupus Erythematosus; Raynauds; Scleroderma Integumentary (Skin) Medical History: Negative for: History of Burn Musculoskeletal Medical History: Negative for: Gout; Rheumatoid Arthritis; Osteoarthritis; Osteomyelitis Neurologic Medical History: Positive for: Neuropathy Negative for: Dementia; Quadriplegia;  Paraplegia; Seizure Disorder Oncologic Medical History: Negative for: Received Chemotherapy; Received Radiation Psychiatric Medical History: Negative for: Anorexia/bulimia; Confinement Anxiety Immunizations Pneumococcal Vaccine: Received Pneumococcal Vaccination: No Implantable Devices None Hospitalization / Surgery History Type of Hospitalization/Surgery inpatient PE 06/2021 Family and Social History Cancer: Yes - Mother,Father; Diabetes: Yes - Mother,Siblings; Heart Disease: No; Hereditary Spherocytosis: No; Hypertension: Yes - Siblings,Mother; Kidney Disease: No; Lung Disease: No; Seizures: Yes - Mother,Siblings; Stroke: Yes - Siblings; Thyroid Problems: No; Tuberculosis: No; Never smoker; Marital Status - Divorced; Alcohol Use: Rarely; Drug Use: No History; Caffeine Use: Daily; Financial Concerns: No; Food, Clothing or Shelter Needs: No; Support System Lacking: No; Transportation Concerns: No Electronic Signature(s) Signed: 08/30/2021 10:29:24 AM By: [name] DO Signed: 08/31/2021 5:12:04 PM By: Baruch Gouty RN, BSN Entered By: [name] on 08/30/2021 10:26:29 -------------------------------------------------------------------------------- [place] Details Patient Name: Date of Service: [name], [place]enc[place] L. 08/30/2021 Medical Record Number: [id number] Patient Account Number: [id number] Date of Birth/Sex: Treating RN: 1960/11/22 (60 y.o. [name] Primary Care Provider: [name] Other Clinician: Referring Provider: Treating Provider/Extender: Veneda Melter Weeks in Treatment: 2 Diagnosis Coding ICD-10 Codes Code Description [phone] Non-pressure chronic ulcer of other part of right lower leg limited to breakdown of skin L97.821 Non-pressure chronic ulcer of other part of left lower leg limited to breakdown of skin I89.0 Lymphedema, not elsewhere classified I87.2 Venous insufficiency (chronic) (peripheral) E11.59 Type 2  diabetes mellitus with other circulatory complications L27.51 Chronic diastolic (congestive) heart failure Physician Procedures : CPT4 Code Description Modifier [id number] - WC PHYS LEVEL 3 - EST PT ICD-10 Diagnosis Description L97.811 Non-pressure chronic ulcer of  other part of right lower leg limited to breakdown of skin L97.821 Non-pressure chronic ulcer of other part of  left lower leg limited to breakdown of skin I89.0 Lymphedema, not elsewhere classified I87.2 Venous insufficiency (chronic) (peripheral) Quantity: 1 Electronic Signature(s) Signed: 08/30/2021 10:29:24 AM By: [name] DO Entered By: [name] on 08/30/2021 10:29:03

## 2021-08-31 NOTE — Telephone Encounter (Addendum)
Outside/paper referral received by [facility] from [facility] F[facility]. Insurance benefits and eligibility to be determined. Unable to reach pt LMTCB. Pass ppw to nurse navigator for review.

## 2021-08-31 NOTE — Progress Notes (Signed)
[name] ([id number]) Visit Report for 08/30/2021 Arrival Information Details Patient Name: Date of Service: [name] 08/30/2021 8:00 A M Medical Record Number: [id number] Patient Account Number: [id number] Date of Birth/Sex: Treating RN: [birth year] (60 y.o. [name] Primary Care Judianne Seiple: [name] Other Clinician: Referring Flor Houdeshell: Treating Deborah Dondero/Extender: Veneda Melter Weeks in Treatment: 2 Visit Information History Since Last Visit Added or deleted any medications: No Patient Arrived: Ambulatory Any new allergies or adverse reactions: No Arrival Time: 07:52 Had a fall or experienced change in No Accompanied By: self activities of daily living that may affect Transfer Assistance: None risk of falls: Patient Identification Verified: Yes Signs or symptoms of abuse/neglect since last visito No Secondary Verification Process Completed: Yes Hospitalized since last visit: No Patient Requires Transmission-Based Precautions: No Implantable device outside of the clinic excluding No Patient Has Alerts: Yes cellular tissue based products placed in the center Patient Alerts: Patient on Blood Thinner since last visit: Pain Present Now: Yes Electronic Signature(s) Signed: 08/30/2021 4:55:55 PM By: Dellie Catholic RN Entered By: Dellie Catholic on 08/30/2021 07:55:02 -------------------------------------------------------------------------------- Compression Therapy Details Patient Name: Date of Service: [name]. 08/30/2021 8:00 A M Medical Record Number: [id number] Patient Account Number: [id number] Date of Birth/Sex: Treating RN: [birth year] (60 y.o. [name] Primary Care Roni Scow: [name] Other Clinician: Referring Yanil Dawe: Treating [name]/Extender: Veneda Melter Weeks in Treatment: 2 Compression Therapy Performed for Wound Assessment: Wound #2 Left,Anterior Lower Leg Performed By:  Clinician Baruch Gouty, RN Compression Type: Three Layer Post Procedure Diagnosis Same as Pre-procedure Electronic Signature(s) Signed: 08/31/2021 5:12:04 PM By: Baruch Gouty RN, BSN Entered By: Baruch Gouty on 08/30/2021 08:49:35 -------------------------------------------------------------------------------- Compression Therapy Details Patient Name: Date of Service: [name] 08/30/2021 8:00 A M Medical Record Number: [id number] Patient Account Number: [id number] Date of Birth/Sex: Treating RN: [birth year] (60 y.o. [name] Primary Care Kaileigh Viswanathan: [name] Other Clinician: Referring Singleton Hickox: Treating [name]/Extender: Veneda Melter Weeks in Treatment: 2 Compression Therapy Performed for Wound Assessment: NonWound Condition Lymphedema - Right Leg Performed By: Clinician Baruch Gouty, RN Compression Type: Three Layer Post Procedure Diagnosis Same as Pre-procedure Electronic Signature(s) Signed: 08/31/2021 5:12:04 PM By: Baruch Gouty RN, BSN Entered By: Baruch Gouty on 08/30/2021 08:52:11 -------------------------------------------------------------------------------- Lower Extremity Assessment Details Patient Name: Date of Service: [name] 08/30/2021 8:00 A M Medical Record Number: [id number] Patient Account Number: [id number] Date of Birth/Sex: Treating RN: 05/16/1961 (60 y.o. [name] Primary Care Rosette Bellavance: [name] Other Clinician: Referring Shakeria Robinette: Treating Desmin Daleo/Extender: Veneda Melter Weeks in Treatment: 2 Edema Assessment Assessed: [Left: No] [Right: No] Edema: [Left: Yes] [Right: Yes] Calf Left: Right: Point of Measurement: 34 cm From Medial Instep 44 cm 44.4 cm Ankle Left: Right: Point of Measurement: 12 cm From Medial Instep 26 cm 26.2 cm Vascular Assessment Pulses: Dorsalis Pedis Palpable: [Left:Yes] [Right:Yes] Electronic Signature(s) Signed:  08/30/2021 4:55:55 PM By: Dellie Catholic RN Signed: 08/30/2021 5:27:57 PM By: Deon Pilling RN, BSN Entered By: Dellie Catholic on 08/30/2021 08:05:00 -------------------------------------------------------------------------------- Multi Wound Chart Details Patient Name: Date of Service: [name] 08/30/2021 8:00 A M Medical Record Number: [id number] Patient Account Number: [id number] Date of Birth/Sex: Treating RN: [birth year] (60 y.o. [name] Primary Care Marissah Vandemark: Other Clinician: [name] Referring Harlyn Rathmann: Treating Lanay Zinda/Extender: Veneda Melter Weeks in Treatment: 2 Vital Signs Height(in): 71 Pulse(bpm): 74 Weight(lbs): 330 Blood Pressure(mmHg): 134/76 Body Mass Index(BMI): 46 Temperature(F): 98.4 Respiratory Rate(breaths/min): 18  Photos: [N/A:N/A] Left, Anterior Lower Leg N/A N/A Wound Location: Gradually Appeared N/A N/A Wounding Event: Venous Leg Ulcer N/A N/A Primary Etiology: Congestive Heart Failure, Coronary N/A N/A Comorbid History: Artery Disease, Deep Vein Thrombosis, Hypertension, Type II Diabetes, Neuropathy 08/23/2021 N/A N/A Date Acquired: 1 N/A N/A Weeks of Treatment: Open N/A N/A Wound Status: 0.7x0.5x0.1 N/A N/A Measurements L x W x D (cm) 0.275 N/A N/A A (cm) : rea 0.027 N/A N/A Volume (cm) : 28.60% N/A N/A % Reduction in Area: 28.90% N/A N/A % Reduction in Volume: Full Thickness Without Exposed N/A N/A Classification: Support Structures Medium N/A N/A Exudate Amount: Serosanguineous N/A N/A Exudate Type: red, brown N/A N/A Exudate Color: Distinct, outline attached N/A N/A Wound Margin: Large (67-100%) N/A N/A Granulation Amount: Red N/A N/A Granulation Quality: None Present (0%) N/A N/A Necrotic Amount: Fat Layer (Subcutaneous Tissue): Yes N/A N/A Exposed Structures: Fascia: No Tendon: No Muscle: No Joint: No Bone: No Small (1-33%) N/A  N/A Epithelialization: Compression Therapy N/A N/A Procedures Performed: Treatment Notes Electronic Signature(s) Signed: 08/30/2021 10:29:24 AM By: [name] DO Signed: 08/31/2021 5:12:04 PM By: Baruch Gouty RN, BSN Entered By: [name] on 08/30/2021 10:24:10 -------------------------------------------------------------------------------- Multi-Disciplinary Care Plan Details Patient Name: Date of Service: [name], [place] 08/30/2021 8:00 A M Medical Record Number: [id number] Patient Account Number: [id number] Date of Birth/Sex: Treating RN: [birth year] (60 y.o. [name] Primary Care Jisel Fleet: [name] Other Clinician: Referring Cherri Yera: Treating Batool Majid/Extender: Veneda Melter Weeks in Treatment: 2 Multidisciplinary Care Plan reviewed with physician Active Inactive Venous Leg Ulcer Nursing Diagnoses: Knowledge deficit related to disease process and management Potential for venous Insuffiency (use before diagnosis confirmed) Goals: Patient will maintain optimal edema control Date Initiated: 08/30/2021 Target Resolution Date: 09/27/2021 Goal Status: Active Interventions: Assess peripheral edema status every visit. Compression as ordered Provide education on venous insufficiency Treatment Activities: Therapeutic compression applied : 08/30/2021 Notes: Wound/Skin Impairment Nursing Diagnoses: Knowledge deficit related to ulceration/compromised skin integrity Goals: Patient/caregiver will verbalize understanding of skin care regimen Date Initiated: 08/16/2021 Target Resolution Date: 09/27/2021 Goal Status: Active Interventions: Assess patient/caregiver ability to perform ulcer/skin care regimen upon admission and as needed Assess ulceration(s) every visit Provide education on ulcer and skin care Treatment Activities: Skin care regimen initiated : 08/16/2021 Topical wound management initiated :  08/16/2021 Notes: Electronic Signature(s) Signed: 08/31/2021 5:12:04 PM By: Baruch Gouty RN, BSN Entered By: Baruch Gouty on 08/30/2021 08:15:41 -------------------------------------------------------------------------------- Pain Assessment Details Patient Name: Date of Service: [name], [place]. 08/30/2021 8:00 A M Medical Record Number: [id number] Patient Account Number: [id number] Date of Birth/Sex: Treating RN: [birth year] (60 y.o. [name] Primary Care Marijean Montanye: [name] Other Clinician: Referring Lyndzee Kliebert: Treating Eryk Beavers/Extender: Veneda Melter Weeks in Treatment: 2 Active Problems Location of Pain Severity and Description of Pain Patient Has Paino Yes Site Locations Site Locations Pain Location: Pain in Ulcers With Dressing Change: No Duration of the Pain. Constant / Intermittento Constant Rate the pain. Current Pain Level: 6 Worst Pain Level: 10 Least Pain Level: 0 Tolerable Pain Level: 4 Character of Pain Describe the Pain: Sharp Pain Management and Medication Current Pain Management: Medication: No Cold Application: No Rest: No Massage: No Activity: No T.E.N.S.: No Heat Application: No Leg drop or elevation: No Is the Current Pain Management Adequate: Adequate How does your wound impact your activities of daily livingo Sleep: No Bathing: No Appetite: No Relationship With Others: No Bladder Continence: No Emotions: No Bowel Continence: No Work: No Toileting: No  Drive: No Dressing: No Hobbies: No Electronic Signature(s) Signed: 08/30/2021 4:55:55 PM By: Dellie Catholic RN Signed: 08/30/2021 5:27:57 PM By: Deon Pilling RN, BSN Entered By: Dellie Catholic on 08/30/2021 07:56:23 -------------------------------------------------------------------------------- Patient/Caregiver Education Details Patient Name: Date of Service: [name], [name] 11/7/2022andnbsp8:00 [place] Record Number:  [id number] Patient Account Number: [id number] Date of Birth/Gender: Treating RN: 10/22/1961 (60 y.o. [name] Primary Care Physician: [name] Other Clinician: Referring Physician: Treating Physician/Extender: Memory Dance in Treatment: 2 Education Assessment Education Provided To: Patient Education Topics Provided Venous: Methods: Explain/Verbal Responses: Reinforcements needed, State content correctly Wound/Skin Impairment: Methods: Explain/Verbal Responses: Reinforcements needed, State content correctly Electronic Signature(s) Signed: 08/31/2021 5:12:04 PM By: Baruch Gouty RN, BSN Entered By: Baruch Gouty on 08/30/2021 08:20:11 -------------------------------------------------------------------------------- Wound Assessment Details Patient Name: Date of Service: [name], [place] 08/30/2021 8:00 A M Medical Record Number: [id number] Patient Account Number: [id number] Date of Birth/Sex: Treating RN: 07/14/1961 (60 y.o. [name] Primary Care Mandel Seiden: [name] Other Clinician: Referring Annaelle Kasel: Treating Cristoval Teall/Extender: Veneda Melter Weeks in Treatment: 2 Wound Status Wound Number: 2 Primary Venous Leg Ulcer Etiology: Wound Location: Left, Anterior Lower Leg Wound Open Wounding Event: Gradually Appeared Status: Date Acquired: 08/23/2021 Comorbid Congestive Heart Failure, Coronary Artery Disease, Deep Vein Weeks Of Treatment: 1 History: Thrombosis, Hypertension, Type II Diabetes, Neuropathy Clustered Wound: No Photos Wound Measurements Length: (cm) 0.7 Width: (cm) 0.5 Depth: (cm) 0.1 Area: (cm) 0.275 Volume: (cm) 0.027 % Reduction in Area: 28.6% % Reduction in Volume: 28.9% Epithelialization: Small (1-33%) Tunneling: No Undermining: No Wound Description Classification: Full Thickness Without Exposed Support Structures Wound Margin: Distinct, outline attached Exudate Amount:  Medium Exudate Type: Serosanguineous Exudate Color: red, brown Foul Odor After Cleansing: No Slough/Fibrino No Wound Bed Granulation Amount: Large (67-100%) Exposed Structure Granulation Quality: Red Fascia Exposed: No Necrotic Amount: None Present (0%) Fat Layer (Subcutaneous Tissue) Exposed: Yes Tendon Exposed: No Muscle Exposed: No Joint Exposed: No Bone Exposed: No Electronic Signature(s) Signed: 08/30/2021 3:53:30 PM By: Sandre Kitty Signed: 08/30/2021 5:27:57 PM By: Deon Pilling RN, BSN Entered By: Sandre Kitty on 08/30/2021 08:12:56 -------------------------------------------------------------------------------- Vitals Details Patient Name: Date of Service: [name], [place] 08/30/2021 8:00 A M Medical Record Number: [id number] Patient Account Number: [id number] Date of Birth/Sex: Treating RN: 1961/05/24 (60 y.o. [name] Primary Care Tiziana Cislo: [name] Other Clinician: Referring Alberto Pina: Treating Thorn Demas/Extender: Veneda Melter Weeks in Treatment: 2 Vital Signs Time Taken: 08:09 Temperature (F): 98.4 Height (in): 71 Pulse (bpm): 74 Weight (lbs): 330 Respiratory Rate (breaths/min): 18 Body Mass Index (BMI): 46 Blood Pressure (mmHg): 134/76 Reference Range: 80 - 120 mg / dl Electronic Signature(s) Signed: 08/30/2021 4:55:55 PM By: Dellie Catholic RN Entered By: Dellie Catholic on 08/30/2021 08:09:44

## 2021-09-01 NOTE — Telephone Encounter (Addendum)
Pt returned phone call and he is interested in the cardiac rehab program. Will pass to nurse navigator for review.

## 2021-09-06 ENCOUNTER — Encounter (HOSPITAL_BASED_OUTPATIENT_CLINIC_OR_DEPARTMENT_OTHER): Payer: Medicaid Other | Admitting: Internal Medicine

## 2021-09-06 ENCOUNTER — Other Ambulatory Visit: Payer: Self-pay

## 2021-09-06 DIAGNOSIS — E119 Type 2 diabetes mellitus without complications: Secondary | ICD-10-CM

## 2021-09-06 DIAGNOSIS — E11622 Type 2 diabetes mellitus with other skin ulcer: Secondary | ICD-10-CM | POA: Diagnosis not present

## 2021-09-06 DIAGNOSIS — I872 Venous insufficiency (chronic) (peripheral): Secondary | ICD-10-CM | POA: Diagnosis not present

## 2021-09-06 DIAGNOSIS — I890 Lymphedema, not elsewhere classified: Secondary | ICD-10-CM

## 2021-09-06 DIAGNOSIS — L97829 Non-pressure chronic ulcer of other part of left lower leg with unspecified severity: Secondary | ICD-10-CM

## 2021-09-06 NOTE — Progress Notes (Signed)
Juan, Stein ([id number]) Visit Report for 09/06/2021 Chief Complaint Document Details Patient Name: Date of Service: [name], [name] 09/06/2021 10:15 A M Medical Record Number: [id number] Patient Account Number: [id number] Date of Birth/Sex: Treating RN: Nov 11, 1960 (60 y.o. [name] Primary Care Provider: [name] Other Clinician: Referring Provider: Treating Provider/Extender: Veneda Melter Weeks in Treatment: 3 Information Obtained from: Patient Chief Complaint 08/16/2021: Bilateral lower extremity scattered blisters. 1 small open wound limited to skin breakdown on the right lower extremity. 08/23/2021; 1 small open wound limited to skin breakdown on the left lower extremity Electronic Signature(s) Signed: 09/06/2021 12:51:32 PM By: [name] DO Entered By: [name] on 09/06/2021 12:46:37 -------------------------------------------------------------------------------- HPI Details Patient Name: Date of Service: [name], [place] 09/06/2021 10:15 A M Medical Record Number: [id number] Patient Account Number: [id number] Date of Birth/Sex: Treating RN: Mar 07, 1961 (60 y.o. [name] Primary Care Provider: [name] Other Clinician: Referring Provider: Treating Provider/Extender: Veneda Melter Weeks in Treatment: 3 History of Present Illness HPI Description: Admission 08/16/2021 Mr. Saleem Coccia is a 60 year old male with a past medical history of CVA, hypertension, uncontrolled type 2 diabetes on insulin complicated by peripheral neuropathy, Chronic venous insufficiency/lymphedema, diastolic CHF, and left lower extremity DVT that presents to clinic today for an open wound to his right lower extremity. He reports an ongoing issue with blistering to his legs and weeping that wax and wane over the past 6 months. He was previously seen in our clinic in June 2022 however he had closed wounds at that  time. He reported that his diuretic was increased then and helped with symptom control. About 2 months ago he developed increased blistering and swelling again. He reports wearing 20/30 compression stockings however it is unclear if he is doing this daily since he reports the stockings are very difficult to put on. He currently denies signs of infection. 10/31; patient presents for follow-up. He has tolerated the compression wraps well. The right leg wound has healed. He does have a small open wound now present on the left anterior lower extremity limited to skin breakdown. He would like to continue wrapping both legs. He states he cannot afford juxta lite compression and would like to use his compression stockings he currently owns. He denies signs of infection. 11/7; patient presents for follow-up. He has no issues or complaints today. While in office he scratched With his fingers at his right leg and now has a small open wound. He continues to have a left lower extremity wound. He tolerated the wraps well. He has his compression stockings today. He denies signs of infection. 11/14; patient presents for follow-up. He has his compression stockings today. He has no issues or complaints. Electronic Signature(s) Signed: 09/06/2021 12:51:32 PM By: [name] DO Entered By: [name] on 09/06/2021 12:46:59 -------------------------------------------------------------------------------- Physical Exam Details Patient Name: Date of Service: Juan, Stein 09/06/2021 10:15 A M Medical Record Number: [id number] Patient Account Number: [id number] Date of Birth/Sex: Treating RN: July 27, 1961 (60 y.o. [name] Primary Care Provider: [name] Other Clinician: Referring Provider: Treating Provider/Extender: Veneda Melter Weeks in Treatment: 3 Constitutional respirations regular, non-labored and within target range for patient.Marland Kitchen Psychiatric pleasant and  cooperative. Notes Epithelialization to previous wound site. No open wounds noted to legs bilaterally. Electronic Signature(s) Signed: 09/06/2021 12:51:32 PM By: [name] DO Entered By: [name] on 09/06/2021 12:49:12 -------------------------------------------------------------------------------- Physician Orders Details Patient Name: Date of Service: [name], [name] 09/06/2021  10:15 A M Medical Record Number: [id number] Patient Account Number: [id number] Date of Birth/Sex: Treating RN: [birth year] (60 y.o. Lorette Ang, Tammi Klippel Primary Care Provider: [name] Other Clinician: Referring Provider: Treating Provider/Extender: Veneda Melter Weeks in Treatment: 3 Verbal / Phone Orders: No Diagnosis Coding ICD-10 Coding Code Description L97.811 Non-pressure chronic ulcer of other part of right lower leg limited to breakdown of skin L97.821 Non-pressure chronic ulcer of other part of left lower leg limited to breakdown of skin I89.0 Lymphedema, not elsewhere classified I87.2 Venous insufficiency (chronic) (peripheral) E11.59 Type 2 diabetes mellitus with other circulatory complications I62.70 Chronic diastolic (congestive) heart failure Discharge From [facility] Services Discharge from [place] - Call if any future wound care needs. Apply lotion to both legs every night before bed. Apply the compression stockings in the morning and remove at night. Wear for life. Electronic Signature(s) Signed: 09/06/2021 12:51:32 PM By: [name] DO Entered By: [name] on 09/06/2021 12:49:27 -------------------------------------------------------------------------------- Problem List Details Patient Name: Date of Service: [name], [name] 09/06/2021 10:15 A M Medical Record Number: [id number] Patient Account Number: [id number] Date of Birth/Sex: Treating RN: 03-12-1961 (60 y.o. Lorette Ang, Tammi Klippel Primary Care Provider: [name] Other  Clinician: Referring Provider: Treating Provider/Extender: Veneda Melter Weeks in Treatment: 3 Active Problems ICD-10 Encounter Code Description Active Date MDM Diagnosis L97.811 Non-pressure chronic ulcer of other part of right lower leg limited to breakdown 08/16/2021 No Yes of skin L97.821 Non-pressure chronic ulcer of other part of left lower leg limited to breakdown 08/23/2021 No Yes of skin I89.0 Lymphedema, not elsewhere classified 08/16/2021 No Yes I87.2 Venous insufficiency (chronic) (peripheral) 08/16/2021 No Yes E11.59 Type 2 diabetes mellitus with other circulatory complications 29/93/7169 No Yes C78.93 Chronic diastolic (congestive) heart failure 08/16/2021 No Yes Inactive Problems Resolved Problems Electronic Signature(s) Signed: 09/06/2021 12:51:32 PM By: [name] DO Entered By: [name] on 09/06/2021 12:46:08 -------------------------------------------------------------------------------- Progress Note Details Patient Name: Date of Service: [name], Aycen L. 09/06/2021 10:15 A M Medical Record Number: [id number] Patient Account Number: [id number] Date of Birth/Sex: Treating RN: 1961-01-27 (60 y.o. [name] Primary Care Provider: [name] Other Clinician: Referring Provider: Treating Provider/Extender: Veneda Melter Weeks in Treatment: 3 Subjective Chief Complaint Information obtained from Patient 08/16/2021: Bilateral lower extremity scattered blisters. 1 small open wound limited to skin breakdown on the right lower extremity. 08/23/2021; 1 small open wound limited to skin breakdown on the left lower extremity History of Present Illness (HPI) Admission 08/16/2021 Mr. Fabian Walder is a 60 year old male with a past medical history of CVA, hypertension, uncontrolled type 2 diabetes on insulin complicated by peripheral neuropathy, Chronic venous insufficiency/lymphedema, diastolic CHF, and left  lower extremity DVT that presents to clinic today for an open wound to his right lower extremity. He reports an ongoing issue with blistering to his legs and weeping that wax and wane over the past 6 months. He was previously seen in our clinic in June 2022 however he had closed wounds at that time. He reported that his diuretic was increased then and helped with symptom control. About 2 months ago he developed increased blistering and swelling again. He reports wearing 20/30 compression stockings however it is unclear if he is doing this daily since he reports the stockings are very difficult to put on. He currently denies signs of infection. 10/31; patient presents for follow-up. He has tolerated the compression wraps well. The right leg wound has healed. He does have a small open  wound now present on the left anterior lower extremity limited to skin breakdown. He would like to continue wrapping both legs. He states he cannot afford juxta lite compression and would like to use his compression stockings he currently owns. He denies signs of infection. 11/7; patient presents for follow-up. He has no issues or complaints today. While in office he scratched With his fingers at his right leg and now has a small open wound. He continues to have a left lower extremity wound. He tolerated the wraps well. He has his compression stockings today. He denies signs of infection. 11/14; patient presents for follow-up. He has his compression stockings today. He has no issues or complaints. Patient History Unable to Obtain Patient History due to Altered Mental Status. Information obtained from Patient. Family History Cancer - Mother,Father, Diabetes - Mother,Siblings, Hypertension - Siblings,Mother, Seizures - Mother,Siblings, Stroke - Siblings, No family history of Heart Disease, Hereditary Spherocytosis, Kidney Disease, Lung Disease, Thyroid Problems, Tuberculosis. Social History Never smoker, Marital Status -  Divorced, Alcohol Use - Rarely, Drug Use - No History, Caffeine Use - Daily. Medical History Eyes Denies history of Cataracts, Glaucoma, Optic Neuritis Ear/Nose/Mouth/Throat Denies history of Chronic sinus problems/congestion, Middle ear problems Hematologic/Lymphatic Denies history of Anemia, Hemophilia, Human Immunodeficiency Virus, Lymphedema, Sickle Cell Disease Respiratory Denies history of Aspiration, Asthma, Chronic Obstructive Pulmonary Disease (COPD), Pneumothorax, Sleep Apnea, Tuberculosis Cardiovascular Patient has history of Congestive Heart Failure, Coronary Artery Disease, Deep Vein Thrombosis, Hypertension Denies history of Angina, Arrhythmia, Hypotension, Myocardial Infarction, Peripheral Arterial Disease, Peripheral Venous Disease, Phlebitis, Vasculitis Gastrointestinal Denies history of Cirrhosis , Colitis, Crohnoos, Hepatitis A, Hepatitis B, Hepatitis C Endocrine Patient has history of Type II Diabetes Denies history of Type I Diabetes Genitourinary Denies history of End Stage Renal Disease Immunological Denies history of Lupus Erythematosus, Raynaudoos, Scleroderma Integumentary (Skin) Denies history of History of Burn Musculoskeletal Denies history of Gout, Rheumatoid Arthritis, Osteoarthritis, Osteomyelitis Neurologic Patient has history of Neuropathy Denies history of Dementia, Quadriplegia, Paraplegia, Seizure Disorder Oncologic Denies history of Received Chemotherapy, Received Radiation Psychiatric Denies history of Anorexia/bulimia, Confinement Anxiety Hospitalization/Surgery History - inpatient PE 06/2021. Medical A Surgical History Notes nd Respiratory PE September 2022 02 dependent at night Objective Constitutional respirations regular, non-labored and within target range for patient.. Vitals Time Taken: 9:57 AM, Height: 71 in, Weight: 330 lbs, BMI: 46, Temperature: 98.4 F, Pulse: 89 bpm, Respiratory Rate: 20 breaths/min, Blood  Pressure: 159/74 mmHg, Capillary Blood Glucose: 220 mg/dl. Psychiatric pleasant and cooperative. General Notes: Epithelialization to previous wound site. No open wounds noted to legs bilaterally. Integumentary (Hair, Skin) Wound #2 status is Open. Original cause of wound was Gradually Appeared. The date acquired was: 08/23/2021. The wound has been in treatment 2 weeks. The wound is located on the Left,Anterior Lower Leg. The wound measures 0cm length x 0cm width x 0cm depth; 0cm^2 area and 0cm^3 volume. There is Fat Layer (Subcutaneous Tissue) exposed. There is no tunneling or undermining noted. There is a none present amount of drainage noted. The wound margin is distinct with the outline attached to the wound base. There is no granulation within the wound bed. There is no necrotic tissue within the wound bed. 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 He has no open wounds present. He has his compression stockings today and we will place these in office. He knows to call with any questions or concerns. He may follow-up as needed. Plan Discharge From [facility] Services: Discharge from [place] - Call if any future wound care needs. Apply lotion to both legs every night before bed. Apply the compression stockings in the morning and remove at night. Wear for life. 1. Daily compression stockings 2. Follow-up as needed 3. Discharge from clinic due to closed wounds Electronic Signature(s) Signed: 09/06/2021 12:51:32 PM By: [name] DO Entered By: [name]  on 09/06/2021 12:50:46 -------------------------------------------------------------------------------- HxROS Details Patient Name: Date of Service: [name], [place] 09/06/2021 10:15 A M Medical Record Number: [id number] Patient Account Number: [id number] Date of Birth/Sex: Treating RN: [birth year] (60 y.o. [name] Primary Care Provider: [name] Other Clinician: Referring Provider: Treating Provider/Extender: Veneda Melter Weeks in Treatment: 3 Unable to Obtain Patient History due to Altered Mental Status Information Obtained From Patient Eyes Medical History: Negative for: Cataracts; Glaucoma; Optic Neuritis Ear/Nose/Mouth/Throat Medical History: Negative for: Chronic sinus problems/congestion; Middle ear problems Hematologic/Lymphatic Medical History: Negative for: Anemia; Hemophilia; Human Immunodeficiency Virus; Lymphedema; Sickle Cell Disease Respiratory Medical History: Negative for: Aspiration; Asthma; Chronic Obstructive Pulmonary Disease (COPD); Pneumothorax; Sleep Apnea; Tuberculosis Past Medical History Notes: PE September 2022 02 dependent at night Cardiovascular Medical History: Positive for: Congestive Heart Failure; Coronary Artery Disease; Deep Vein Thrombosis; Hypertension Negative for: Angina; Arrhythmia; Hypotension; Myocardial Infarction; Peripheral Arterial Disease; Peripheral Venous Disease; Phlebitis; Vasculitis Gastrointestinal Medical History: Negative for: Cirrhosis ; Colitis; Crohns; Hepatitis A; Hepatitis B; Hepatitis C Endocrine Medical History: Positive for: Type II Diabetes Negative for: Type I Diabetes Time with diabetes: 10 years Treated with: Insulin, Oral agents Blood sugar tested every day: Yes Tested : TID Genitourinary Medical History: Negative for: End Stage Renal Disease Immunological Medical History: Negative for: Lupus Erythematosus; Raynauds; Scleroderma Integumentary (Skin) Medical  History: Negative for: History of Burn Musculoskeletal Medical History: Negative for: Gout; Rheumatoid Arthritis; Osteoarthritis; Osteomyelitis Neurologic Medical History: Positive for: Neuropathy Negative for: Dementia; Quadriplegia; Paraplegia; Seizure Disorder Oncologic Medical History: Negative for: Received Chemotherapy; Received Radiation Psychiatric Medical History: Negative for: Anorexia/bulimia; Confinement Anxiety Immunizations Pneumococcal Vaccine: Received Pneumococcal Vaccination: No Implantable Devices None Hospitalization / Surgery History Type of Hospitalization/Surgery inpatient PE 06/2021 Family and Social History Cancer: Yes - Mother,Father; Diabetes: Yes - Mother,Siblings; Heart Disease: No; Hereditary Spherocytosis: No; Hypertension: Yes - Siblings,Mother; Kidney Disease: No; Lung Disease: No; Seizures: Yes - Mother,Siblings; Stroke: Yes - Siblings; Thyroid Problems: No; Tuberculosis: No; Never smoker; Marital Status - Divorced; Alcohol Use: Rarely; Drug Use: No History; Caffeine Use: Daily; Financial Concerns: No; Food, Clothing or Shelter Needs: No; Support System Lacking: No; Transportation Concerns: No Electronic Signature(s) Signed: 09/06/2021 12:51:32 PM By: [name] DO Signed: 09/06/2021 4:35:28 PM By: Lorrin Jackson Entered By: [name] on 09/06/2021 12:47:07 -------------------------------------------------------------------------------- [place] Details Patient Name: Date of Service: [name], [place] L. 09/06/2021 Medical Record Number: [id number] Patient Account Number: [id number] Date of Birth/Sex: Treating RN: 26-Aug-1961 (60 y.o. [name] Primary Care Provider: [name] Other Clinician: Referring Provider: Treating Provider/Extender: Veneda Melter Weeks in Treatment: 3 Diagnosis Coding ICD-10 Codes Code Description [phone] Non-pressure chronic ulcer of other part of right lower leg limited  to breakdown of skin L97.821 Non-pressure chronic ulcer of other part of left lower leg limited to breakdown of skin I89.0  Lymphedema, not elsewhere classified I87.2 Venous insufficiency (chronic) (peripheral) E11.59 Type 2 diabetes mellitus with other circulatory complications Z98.02 Chronic diastolic (congestive) heart failure Facility Procedures CPT4 Code: [id number] Description: 99213 - WOUND CARE VISIT-LEV 3 EST PT Modifier: Quantity: 1 Physician Procedures : CPT4 Code Description Modifier [id number] - WC PHYS LEVEL 3 - EST PT ICD-10 Diagnosis Description L97.811 Non-pressure chronic ulcer of other part of right lower leg limited to breakdown of skin L97.821 Non-pressure chronic ulcer of other part of  left lower leg limited to breakdown of skin I87.2 Venous insufficiency (chronic) (peripheral) E11.59 Type 2 diabetes mellitus with other circulatory complications Quantity: 1 Electronic Signature(s) Signed: 09/06/2021 12:51:32 PM By: [name] DO Entered By: [name] on 09/06/2021 12:51:04

## 2021-09-07 NOTE — Progress Notes (Signed)
[name], [name] ([id number]) Visit Report for 09/06/2021 Arrival Information Details Patient Name: Date of Service: Juan, Stein 09/06/2021 10:15 A M Medical Record Number: [id number] Patient Account Number: [id number] Date of Birth/Sex: Treating RN: [birth year] (60 y.o. [name] Primary Care Enzley Kitchens: [name] Other Clinician: Referring Serenidy Waltz: Treating Lavonte Palos/Extender: Veneda Melter Weeks in Treatment: 3 Visit Information History Since Last Visit Added or deleted any medications: No Patient Arrived: Ambulatory Any new allergies or adverse reactions: No Arrival Time: 09:57 Had a fall or experienced change in No Accompanied By: self activities of daily living that may affect Transfer Assistance: None risk of falls: Patient Identification Verified: Yes Signs or symptoms of abuse/neglect since last visito No Secondary Verification Process Completed: Yes Hospitalized since last visit: No Patient Requires Transmission-Based Precautions: No Implantable device outside of the clinic excluding No Patient Has Alerts: Yes cellular tissue based products placed in the center Patient Alerts: Patient on Blood Thinner since last visit: Has Dressing in Place as Prescribed: Yes Has Compression in Place as Prescribed: Yes Pain Present Now: Yes Electronic Signature(s) Signed: 09/06/2021 4:37:29 PM By: Deon Pilling RN, BSN Entered By: Deon Pilling on 09/06/2021 10:00:21 -------------------------------------------------------------------------------- Clinic Level of Care Assessment Details Patient Name: Date of Service: [name] 09/06/2021 10:15 A M Medical Record Number: [id number] Patient Account Number: [id number] Date of Birth/Sex: Treating RN: [birth year] (60 y.o. [name] Primary Care Redmond Whittley: [name] Other Clinician: Referring Quinton Voth: Treating Chyan Carnero/Extender: Veneda Melter Weeks in Treatment:  3 Clinic Level of Care Assessment Items TOOL 4 Quantity Score X- 1 0 Use when only an EandM is performed on FOLLOW-UP visit ASSESSMENTS - Nursing Assessment / Reassessment X- 1 10 Reassessment of Co-morbidities (includes updates in patient status) X- 1 5 Reassessment of Adherence to Treatment Plan ASSESSMENTS - Wound and Skin A ssessment / Reassessment X - Simple Wound Assessment / Reassessment - one wound 1 5 []  - 0 Complex Wound Assessment / Reassessment - multiple wounds X- 1 10 Dermatologic / Skin Assessment (not related to wound area) ASSESSMENTS - Focused Assessment X- 2 5 Circumferential Edema Measurements - multi extremities []  - 0 Nutritional Assessment / Counseling / Intervention []  - 0 Lower Extremity Assessment (monofilament, tuning fork, pulses) []  - 0 Peripheral Arterial Disease Assessment (using hand held doppler) ASSESSMENTS - Ostomy and/or Continence Assessment and Care []  - 0 Incontinence Assessment and Management []  - 0 Ostomy Care Assessment and Management (repouching, etc.) PROCESS - Coordination of Care X - Simple Patient / Family Education for ongoing care 1 15 []  - 0 Complex (extensive) Patient / Family Education for ongoing care X- 1 10 Staff obtains Programmer, systems, Records, T Results / Process Orders est []  - 0 Staff telephones HHA, Nursing Homes / Clarify orders / etc []  - 0 Routine Transfer to another Facility (non-emergent condition) []  - 0 Routine Hospital Admission (non-emergent condition) []  - 0 New Admissions / Biomedical engineer / Ordering NPWT Apligraf, etc. , []  - 0 Emergency Hospital Admission (emergent condition) X- 1 10 Simple Discharge Coordination []  - 0 Complex (extensive) Discharge Coordination PROCESS - Special Needs []  - 0 Pediatric / Minor Patient Management []  - 0 Isolation Patient Management []  - 0 Hearing / Language / Visual special needs []  - 0 Assessment of Community assistance (transportation, D/C  planning, etc.) []  - 0 Additional assistance / Altered mentation []  - 0 Support Surface(s) Assessment (bed, cushion, seat, etc.) INTERVENTIONS - Wound Cleansing / Measurement X - Simple Wound  Cleansing - one wound 1 5 []  - 0 Complex Wound Cleansing - multiple wounds X- 1 5 Wound Imaging (photographs - any number of wounds) []  - 0 Wound Tracing (instead of photographs) X- 1 5 Simple Wound Measurement - one wound []  - 0 Complex Wound Measurement - multiple wounds INTERVENTIONS - Wound Dressings []  - 0 Small Wound Dressing one or multiple wounds []  - 0 Medium Wound Dressing one or multiple wounds []  - 0 Large Wound Dressing one or multiple wounds []  - 0 Application of Medications - topical []  - 0 Application of Medications - injection INTERVENTIONS - Miscellaneous []  - 0 External ear exam []  - 0 Specimen Collection (cultures, biopsies, blood, body fluids, etc.) []  - 0 Specimen(s) / Culture(s) sent or taken to Lab for analysis []  - 0 Patient Transfer (multiple staff / Civil Service fast streamer / Similar devices) []  - 0 Simple Staple / Suture removal (25 or less) []  - 0 Complex Staple / Suture removal (26 or more) []  - 0 Hypo / Hyperglycemic Management (close monitor of Blood Glucose) []  - 0 Ankle / Brachial Index (ABI) - do not check if billed separately X- 1 5 Vital Signs Has the patient been seen at the hospital within the last three years: Yes Total Score: 95 Level Of Care: New/Established - Level 3 Electronic Signature(s) Signed: 09/06/2021 4:37:29 PM By: Deon Pilling RN, BSN Entered By: Deon Pilling on 09/06/2021 10:33:33 -------------------------------------------------------------------------------- Encounter Discharge Information Details Patient Name: Date of Service: [name], [name] 09/06/2021 10:15 A M Medical Record Number: [id number] Patient Account Number: [id number] Date of Birth/Sex: Treating RN: Sep 07, 1961 (60 y.o. [name] Primary Care Titilayo Hagans:  [name] Other Clinician: Referring Marcos Ruelas: Treating Angelica Wix/Extender: Veneda Melter Weeks in Treatment: 3 Encounter Discharge Information Items Discharge Condition: Stable Ambulatory Status: Ambulatory Discharge Destination: Home Transportation: Private Auto Accompanied By: self Schedule Follow-up Appointment: No Clinical Summary of Care: Notes compression stockings to BLE. Electronic Signature(s) Signed: 09/06/2021 4:37:29 PM By: Deon Pilling RN, BSN Entered By: Deon Pilling on 09/06/2021 10:34:35 -------------------------------------------------------------------------------- Lower Extremity Assessment Details Patient Name: Date of Service: [name], Harutyun L. 09/06/2021 10:15 A M Medical Record Number: [id number] Patient Account Number: [id number] Date of Birth/Sex: Treating RN: 10/14/1961 (60 y.o. [name] Primary Care Jame Morrell: [name] Other Clinician: Referring Christapher Gillian: Treating Christophor Eick/Extender: Veneda Melter Weeks in Treatment: 3 Edema Assessment Assessed: [Left: Yes] [Right: Yes] Edema: [Left: Yes] [Right: Yes] Calf Left: Right: Point of Measurement: 34 cm From Medial Instep 47.3 cm 45.5 cm Ankle Left: Right: Point of Measurement: 12 cm From Medial Instep 26.5 cm 26 cm Vascular Assessment Pulses: Dorsalis Pedis Palpable: [Left:Yes] [Right:Yes] Electronic Signature(s) Signed: 09/06/2021 4:37:29 PM By: Deon Pilling RN, BSN Entered By: Deon Pilling on 09/06/2021 10:04:19 -------------------------------------------------------------------------------- Multi Wound Chart Details Patient Name: Date of Service: [name], Jaimere L. 09/06/2021 10:15 A M Medical Record Number: [id number] Patient Account Number: [id number] Date of Birth/Sex: Treating RN: 1961/02/19 (60 y.o. [name] Primary Care Naftula Donahue: [name] Other Clinician: Referring Keaton Stirewalt: Treating Molly Savarino/Extender: Veneda Melter Weeks in Treatment: 3 Vital Signs Height(in): 71 Capillary Blood Glucose(mg/dl): 220 Weight(lbs): 330 Pulse(bpm): 35 Body Mass Index(BMI): 87 Blood Pressure(mmHg): 159/74 Temperature(F): 98.4 Respiratory Rate(breaths/min): 20 Photos: [N/A:N/A] Left, Anterior Lower Leg N/A N/A Wound Location: Gradually Appeared N/A N/A Wounding Event: Venous Leg Ulcer N/A N/A Primary Etiology: Congestive Heart Failure, Coronary N/A N/A Comorbid History: Artery Disease, Deep Vein Thrombosis, Hypertension, Type II Diabetes, Neuropathy 08/23/2021 N/A N/A  Date Acquired: 2 N/A N/A Weeks of Treatment: Open N/A N/A Wound Status: 0x0x0 N/A N/A Measurements L x W x D (cm) 0 N/A N/A A (cm) : rea 0 N/A N/A Volume (cm) : 100.00% N/A N/A % Reduction in Area: 100.00% N/A N/A % Reduction in Volume: Full Thickness Without Exposed N/A N/A Classification: Support Structures None Present N/A N/A Exudate Amount: Distinct, outline attached N/A N/A Wound Margin: None Present (0%) N/A N/A Granulation Amount: None Present (0%) N/A N/A Necrotic Amount: Fat Layer (Subcutaneous Tissue): Yes N/A N/A Exposed Structures: Fascia: No Tendon: No Muscle: No Joint: No Bone: No Large (67-100%) N/A N/A Epithelialization: Treatment Notes Electronic Signature(s) Signed: 09/06/2021 12:51:32 PM By: [name] DO Signed: 09/06/2021 4:35:28 PM By: Lorrin Jackson Entered By: [name] on 09/06/2021 12:46:22 -------------------------------------------------------------------------------- Multi-Disciplinary Care Plan Details Patient Name: Date of Service: [name], Rieley L. 09/06/2021 10:15 A M Medical Record Number: [id number] Patient Account Number: [id number] Date of Birth/Sex: Treating RN: [birth year] (60 y.o. [name] Primary Care Zarra Geffert: [name] Other Clinician: Referring Seann Genther: Treating Dayshia Ballinas/Extender: Veneda Melter Weeks in Treatment: 3 Multidisciplinary Care Plan reviewed with physician Active Inactive Electronic Signature(s) Signed: 09/06/2021 4:37:29 PM By: Deon Pilling RN, BSN Entered By: Deon Pilling on 09/06/2021 10:38:07 -------------------------------------------------------------------------------- Pain Assessment Details Patient Name: Date of Service: [name], Sayvon L. 09/06/2021 10:15 A M Medical Record Number: [id number] Patient Account Number: [id number] Date of Birth/Sex: Treating RN: [birth year] (60 y.o. [name] Primary Care Jeanne Diefendorf: [name] Other Clinician: Referring Britlyn Martine: Treating Wileen Duncanson/Extender: Veneda Melter Weeks in Treatment: 3 Active Problems Location of Pain Severity and Description of Pain Patient Has Paino Yes Site Locations Pain Location: Generalized Pain Rate the pain. Current Pain Level: 7 Worst Pain Level: 10 Least Pain Level: 0 Tolerable Pain Level: 8 Pain Management and Medication Current Pain Management: Medication: No Cold Application: No Rest: No Massage: No Activity: No T.E.N.S.: No Heat Application: No Leg drop or elevation: No Is the Current Pain Management Adequate: Adequate How does your wound impact your activities of daily livingo Sleep: No Bathing: No Appetite: No Relationship With Others: No Bladder Continence: No Emotions: No Bowel Continence: No Work: No Toileting: No Drive: No Dressing: No Hobbies: No Engineer, maintenance) Signed: 09/06/2021 4:37:29 PM By: Deon Pilling RN, BSN Entered By: Deon Pilling on 09/06/2021 10:00:50 -------------------------------------------------------------------------------- Patient/Caregiver Education Details Patient Name: Date of Service: [name], [name] 11/14/2022andnbsp10:15 [place] Record Number: [id number] Patient Account Number: [id number] Date of Birth/Gender: Treating RN: 21-Oct-1961 (60 y.o. [name] Primary  Care Physician: [name] Other Clinician: Referring Physician: Treating Physician/Extender: Veneda Melter Weeks in Treatment: 3 Education Assessment Education Provided To: Patient Education Topics Provided Wound/Skin Impairment: Handouts: Skin Care Do's and Dont's Methods: Explain/Verbal Responses: Reinforcements needed Electronic Signature(s) Signed: 09/06/2021 4:37:29 PM By: Deon Pilling RN, BSN Entered By: Deon Pilling on 09/06/2021 10:09:58 -------------------------------------------------------------------------------- Wound Assessment Details Patient Name: Date of Service: [name], Nikolaj L. 09/06/2021 10:15 A M Medical Record Number: [id number] Patient Account Number: [id number] Date of Birth/Sex: Treating RN: 1961/01/20 (60 y.o. [name] Primary Care Chamar Broughton: [name] Other Clinician: Referring Damere Brandenburg: Treating Tiane Szydlowski/Extender: Veneda Melter Weeks in Treatment: 3 Wound Status Wound Number: 2 Primary Venous Leg Ulcer Etiology: Etiology: Wound Location: Left, Anterior Lower Leg Wound Open Wounding Event: Gradually Appeared Status: Date Acquired: 08/23/2021 Comorbid Congestive Heart Failure, Coronary Artery Disease, Deep Vein Weeks Of Treatment: 2 History: Thrombosis, Hypertension, Type II Diabetes,  Neuropathy Clustered Wound: No Photos Wound Measurements Length: (cm) Width: (cm) Depth: (cm) Area: (cm) Volume: (cm) 0 % Reduction in Area: 100% 0 % Reduction in Volume: 100% 0 Epithelialization: Large (67-100%) 0 Tunneling: No 0 Undermining: No Wound Description Classification: Full Thickness Without Exposed Support Structures Wound Margin: Distinct, outline attached Exudate Amount: None Present Foul Odor After Cleansing: No Slough/Fibrino No Wound Bed Granulation Amount: None Present (0%) Exposed Structure Necrotic Amount: None Present (0%) Fascia Exposed: No Fat Layer (Subcutaneous  Tissue) Exposed: Yes Tendon Exposed: No Muscle Exposed: No Joint Exposed: No Bone Exposed: No Electronic Signature(s) Signed: 09/06/2021 4:37:29 PM By: Deon Pilling RN, BSN Signed: 09/07/2021 1:54:26 PM By: Sandre Kitty Entered By: Sandre Kitty on 09/06/2021 10:09:28 -------------------------------------------------------------------------------- Vitals Details Patient Name: Date of Service: [name], Yacoub L. 09/06/2021 10:15 A M Medical Record Number: [id number] Patient Account Number: [id number] Date of Birth/Sex: Treating RN: [birth year] (60 y.o. [name] Primary Care Kartel Wolbert: [name] Other Clinician: Referring Lamika Connolly: Treating Naylah Cork/Extender: Veneda Melter Weeks in Treatment: 3 Vital Signs Time Taken: 09:57 Temperature (F): 98.4 Height (in): 71 Pulse (bpm): 89 Weight (lbs): 330 Respiratory Rate (breaths/min): 20 Body Mass Index (BMI): 46 Blood Pressure (mmHg): 159/74 Capillary Blood Glucose (mg/dl): 220 Reference Range: 80 - 120 mg / dl Electronic Signature(s) Signed: 09/06/2021 4:37:29 PM By: Deon Pilling RN, BSN Entered By: Deon Pilling on 09/06/2021 10:00:36

## 2021-09-27 ENCOUNTER — Telehealth: Payer: Self-pay | Admitting: Cardiology

## 2021-09-27 NOTE — Telephone Encounter (Signed)
Patient said he had a CT done and that test found a blood clot in his R lung. The patient wanted to know if there was a way for DR. Crenshaw to order a repeat test to make sure the blood clot has gone away.  The patient is not sure what to do. Please advise

## 2021-09-27 NOTE — Telephone Encounter (Signed)
Returned call to patient- patient reports he was diagnosed with PE in September in the hospital.  States he saw his PCP last month and she has requested he contacted Dr. Stanford Breed for repeat imaging.  He continues to have SOB/CP.    He states he has SOB with rest and exertion.  Reports when he becomes SOB, he then begins to feel chest pain.   He is currently on Eliquis.     Per chart review:  06/26/21 IMPRESSION: 1. Positive for pulmonary embolus with moderate clot burden and evidence of right heart strain, RV to LV ratio of 1.08.  Echo completed 9/5  States his PCP has been following this.  Saw Dr. Alen Blew as well 07/30/21.     Advised would contact Dr. Alen Blew since he is following this but would also send message to Dr. Stanford Breed.   Advised may need appt to assess his symptoms further.   Last OV 4/5 with Coletta Memos.  Upcoming appointment 1/10 with Dr. Stanford Breed.

## 2021-09-28 ENCOUNTER — Encounter (HOSPITAL_COMMUNITY): Payer: Self-pay

## 2021-09-28 ENCOUNTER — Ambulatory Visit (HOSPITAL_COMMUNITY): Admit: 2021-09-28 | Payer: Medicaid Other | Admitting: Gastroenterology

## 2021-09-28 SURGERY — COLONOSCOPY WITH PROPOFOL
Anesthesia: Monitor Anesthesia Care

## 2021-09-29 NOTE — Telephone Encounter (Signed)
Follow up scheduled

## 2021-10-05 ENCOUNTER — Other Ambulatory Visit: Payer: Self-pay | Admitting: Family Medicine

## 2021-10-05 ENCOUNTER — Other Ambulatory Visit (HOSPITAL_COMMUNITY): Payer: Self-pay | Admitting: Family Medicine

## 2021-10-05 DIAGNOSIS — I2699 Other pulmonary embolism without acute cor pulmonale: Secondary | ICD-10-CM

## 2021-10-05 DIAGNOSIS — R079 Chest pain, unspecified: Secondary | ICD-10-CM

## 2021-10-05 DIAGNOSIS — R0602 Shortness of breath: Secondary | ICD-10-CM

## 2021-10-05 NOTE — Telephone Encounter (Signed)
Spoke with pt, he is concerned that he continues to have SOB at rest and with little activity. He reports he always has swelling in his feet, legs and occasional abdominal bloating. He has restarted the eliquis and has a little bleeding but it is getting better.due to his back he sleeps in a recliner. He does not weigh but reports at a doctors appointment he weighted 341 lb which is good for him. He is taking furosemide 40 mg TID currently. He is concerned about returning to work and about his SOB. He has a follow up appointment in January. Aware dr Stanford Breed is not in the office to but will forward for his review

## 2021-10-05 NOTE — Telephone Encounter (Signed)
Patient is following up, requesting to speak with Dr. Jacalyn Lefevre nurse. He states he would like to further discuss blood clot and he would like to know if Dr. Stanford Breed has any recommendations for him prior to 11/02/21 appointment with Dr. Stanford Breed. Please advise.

## 2021-10-06 NOTE — Telephone Encounter (Signed)
Spoke with pt, he has been scheduled for a repeat CTA on Friday this week and would like to wait until after that to change appointment. Aware will watch for those results.

## 2021-10-08 ENCOUNTER — Ambulatory Visit (HOSPITAL_COMMUNITY)
Admission: RE | Admit: 2021-10-08 | Discharge: 2021-10-08 | Disposition: A | Discharge status: Home / Self Care | Payer: Medicaid Other | Source: Ambulatory Visit | Attending: Family Medicine | Admitting: Family Medicine

## 2021-10-08 ENCOUNTER — Ambulatory Visit: Payer: Medicaid Other | Admitting: Adult Health

## 2021-10-08 ENCOUNTER — Other Ambulatory Visit: Payer: Self-pay

## 2021-10-08 DIAGNOSIS — I2699 Other pulmonary embolism without acute cor pulmonale: Secondary | ICD-10-CM | POA: Insufficient documentation

## 2021-10-08 DIAGNOSIS — R079 Chest pain, unspecified: Secondary | ICD-10-CM | POA: Insufficient documentation

## 2021-10-08 DIAGNOSIS — R0602 Shortness of breath: Secondary | ICD-10-CM | POA: Insufficient documentation

## 2021-10-08 LAB — POCT I-STAT CREATININE: Creatinine, Ser: 0.7 mg/dL (ref 0.61–1.24)

## 2021-10-08 MED ORDER — IOHEXOL 350 MG/ML SOLN
100.0000 mL | Freq: Once | INTRAVENOUS | Status: AC | PRN
  Administered 2021-10-08: 100 mL via INTRAVENOUS

## 2021-10-16 ENCOUNTER — Emergency Department (HOSPITAL_COMMUNITY): Payer: Medicaid Other

## 2021-10-16 ENCOUNTER — Other Ambulatory Visit: Payer: Self-pay

## 2021-10-16 ENCOUNTER — Encounter (HOSPITAL_COMMUNITY): Payer: Self-pay | Admitting: Emergency Medicine

## 2021-10-16 ENCOUNTER — Emergency Department (HOSPITAL_COMMUNITY)
Admission: EM | Admit: 2021-10-16 | Discharge: 2021-10-17 | Disposition: A | Discharge status: Home / Self Care | Payer: Medicaid Other | Attending: Emergency Medicine | Admitting: Emergency Medicine

## 2021-10-16 DIAGNOSIS — E1159 Type 2 diabetes mellitus with other circulatory complications: Secondary | ICD-10-CM | POA: Diagnosis not present

## 2021-10-16 DIAGNOSIS — I5041 Acute combined systolic (congestive) and diastolic (congestive) heart failure: Secondary | ICD-10-CM | POA: Insufficient documentation

## 2021-10-16 DIAGNOSIS — Z20822 Contact with and (suspected) exposure to covid-19: Secondary | ICD-10-CM | POA: Diagnosis not present

## 2021-10-16 DIAGNOSIS — I2510 Atherosclerotic heart disease of native coronary artery without angina pectoris: Secondary | ICD-10-CM | POA: Insufficient documentation

## 2021-10-16 DIAGNOSIS — Z794 Long term (current) use of insulin: Secondary | ICD-10-CM | POA: Diagnosis not present

## 2021-10-16 DIAGNOSIS — Z7901 Long term (current) use of anticoagulants: Secondary | ICD-10-CM | POA: Insufficient documentation

## 2021-10-16 DIAGNOSIS — R0600 Dyspnea, unspecified: Secondary | ICD-10-CM

## 2021-10-16 DIAGNOSIS — Z8616 Personal history of COVID-19: Secondary | ICD-10-CM | POA: Insufficient documentation

## 2021-10-16 DIAGNOSIS — Z79899 Other long term (current) drug therapy: Secondary | ICD-10-CM | POA: Diagnosis not present

## 2021-10-16 DIAGNOSIS — Z7984 Long term (current) use of oral hypoglycemic drugs: Secondary | ICD-10-CM | POA: Diagnosis not present

## 2021-10-16 DIAGNOSIS — I110 Hypertensive heart disease with heart failure: Secondary | ICD-10-CM | POA: Insufficient documentation

## 2021-10-16 DIAGNOSIS — R079 Chest pain, unspecified: Secondary | ICD-10-CM | POA: Insufficient documentation

## 2021-10-16 DIAGNOSIS — Z955 Presence of coronary angioplasty implant and graft: Secondary | ICD-10-CM | POA: Insufficient documentation

## 2021-10-16 LAB — CBC WITH DIFFERENTIAL/PLATELET
Abs Immature Granulocytes: 0.05 10*3/uL (ref 0.00–0.07)
Basophils Absolute: 0 10*3/uL (ref 0.0–0.1)
Basophils Relative: 0 %
Eosinophils Absolute: 0.2 10*3/uL (ref 0.0–0.5)
Eosinophils Relative: 2 %
HCT: 37.8 % — ABNORMAL LOW (ref 39.0–52.0)
Hemoglobin: 12.3 g/dL — ABNORMAL LOW (ref 13.0–17.0)
Immature Granulocytes: 1 %
Lymphocytes Relative: 25 %
Lymphs Abs: 2.4 10*3/uL (ref 0.7–4.0)
MCH: 30.3 pg (ref 26.0–34.0)
MCHC: 32.5 g/dL (ref 30.0–36.0)
MCV: 93.1 fL (ref 80.0–100.0)
Monocytes Absolute: 0.7 10*3/uL (ref 0.1–1.0)
Monocytes Relative: 7 %
Neutro Abs: 6 10*3/uL (ref 1.7–7.7)
Neutrophils Relative %: 65 %
Platelets: 213 10*3/uL (ref 150–400)
RBC: 4.06 MIL/uL — ABNORMAL LOW (ref 4.22–5.81)
RDW: 13.4 % (ref 11.5–15.5)
WBC: 9.3 10*3/uL (ref 4.0–10.5)
nRBC: 0 % (ref 0.0–0.2)

## 2021-10-16 LAB — BASIC METABOLIC PANEL
Anion gap: 13 (ref 5–15)
BUN: 13 mg/dL (ref 6–20)
CO2: 23 mmol/L (ref 22–32)
Calcium: 8.7 mg/dL — ABNORMAL LOW (ref 8.9–10.3)
Chloride: 101 mmol/L (ref 98–111)
Creatinine, Ser: 0.99 mg/dL (ref 0.61–1.24)
GFR, Estimated: >60 mL/min (ref >60)
Glucose, Bld: 311 mg/dL — ABNORMAL HIGH (ref 70–99)
Potassium: 3.6 mmol/L (ref 3.5–5.1)
Sodium: 137 mmol/L (ref 135–145)

## 2021-10-16 LAB — BRAIN NATRIURETIC PEPTIDE: B Natriuretic Peptide: 30.1 pg/mL (ref 0.0–100.0)

## 2021-10-16 LAB — TROPONIN I (HIGH SENSITIVITY): Troponin I (High Sensitivity): 8 ng/L (ref <18)

## 2021-10-16 NOTE — Discharge Instructions (Signed)

## 2021-10-16 NOTE — ED Notes (Signed)
Pt ambulated with steady gait on room air with spo2 staying between 94%-98%.

## 2021-10-16 NOTE — ED Notes (Signed)
Son Jeshawn Melucci [phone] wants to speak to his father

## 2021-10-16 NOTE — ED Triage Notes (Addendum)
Pt bib [facility] c/o shortness of breath and intermittent chest pain- onset approx 1 week ago but became more severe today. Pt with positive R side PE - currently on eliquis. Hx of heart failure. Pt arrives on 4L homes O2. 324 mg aspirin and 1 nitro given PTA. Pt initially hypertensive with ems at 198/86, last BP 136/82 after nitro given.

## 2021-10-16 NOTE — ED Provider Notes (Signed)
[place] EMERGENCY DEPARTMENT Provider Note   CSN: [id number] Arrival date & time: 10/16/21  1957     History Chief Complaint  Patient presents with   Shortness of Breath   Chest Pain    [name] is a 60 y.o. male.  HPI     60 year old male comes in with chief complaint of chest pain and shortness of breath.  Patient has history of CAD, CHF, PE on Eliquis.  Patient has been taking his medications as prescribed  He reports that today started having some shortness of breath. Shortness of breath is present with exertion and also at rest.  He denies any new cough, fevers, chills.  Patient's chest pain is central.  He has no wheezing.  Patient was seen in the ER about a week ago and had a CT PE which was negative for PE  Past Medical History:  Diagnosis Date   Anemia    Arthritis    Back pain    CAD (coronary artery disease)    a. s/p DES to LAD in 05/2016   Cervical radiculopathy    Chronic diastolic CHF (congestive heart failure) (HCC)    Chronic pain    Depression    DVT (deep venous thrombosis) (HCC)    Hematemesis    Hepatic steatosis    Hyperlipidemia    Hypertension    IBS (irritable bowel syndrome)    Morbid obesity (HCC)    OSA (obstructive sleep apnea)    Pancreatitis    PE (pulmonary thromboembolism) ([place])    Peripheral neuropathy    PUD (peptic ulcer disease)    Renal disorder    Stroke [facility])    a. ?details unclear - not seen on imaging when he was admitted in 05/2017 for TIA symptoms which were felt due to cervical radiculopathy.   Thoracic aortic ectasia (HCC)    a. 4.3cm ectatic ascending thoracic aorta by CT 06/2017.    Type 2 diabetes mellitus ([place])     Patient Active Problem List   Diagnosis Date Noted   GI bleed 07/15/2021   Pulmonary embolism ([place]) 07/15/2021   Acute pulmonary embolism ([place]) 06/26/2021   Diabetic ulcer of lower leg ([place]) 06/26/2021   Acute respiratory failure with hypoxia ([place]) 06/26/2021    COVID-19 virus infection 06/26/2021   Lumbar radiculitis 03/30/2021   Morbid obesity ([place]) 01/15/2021   Personal history of colonic polyps 01/15/2021   Rectal bleeding 01/15/2021   Pain due to onychomycosis of toenails of both feet 01/15/2021   Epistaxis, recurrent 10/21/2020   Laceration of nose 10/21/2020   Syncope 10/15/2020   OSA (obstructive sleep apnea)    Bilateral carotid artery stenosis 07/28/2020   Bilateral lower extremity edema 07/28/2020   CHF (congestive heart failure) ([place]) 07/28/2020   DDD (degenerative disc disease), lumbar 07/28/2020   Diabetic peripheral neuropathy ([place]) 07/28/2020   Fatty liver 07/28/2020   Irritable bowel syndrome with diarrhea 07/28/2020   Osteoarthritis of right hip 07/28/2020   Upper GI bleed 04/28/2020   Hypertensive urgency 04/27/2020   Acute GI bleeding 04/27/2020   Mixed diabetic hyperlipidemia associated with type 2 diabetes mellitus ([place]) 04/27/2020   Rotator cuff arthropathy 10/30/2019   Cervical radiculopathy 09/24/2019   Trochanteric bursitis of right hip 09/24/2019   Chronic diastolic CHF (congestive heart failure) ([place]) 08/23/2019   ARF (acute renal failure) ([place]) 08/01/2019   Hyperkalemia 08/01/2019   Uncontrolled type 2 diabetes mellitus with hyperglycemia ([place]) 08/01/2019   AKI (acute kidney injury) (  [place])    Hypotension 07/31/2019   DKA (diabetic ketoacidosis) ([place]) 05/05/2019   Blurred vision 05/05/2019   Trochanteric bursitis 02/11/2019   Long-term insulin use ([place]) 01/25/2019   Alteration consciousness 11/19/2018   Neck pain 10/09/2018   Left arm weakness 10/09/2018   B12 deficiency 08/10/2017   Persistent headaches 08/09/2017   GERD (gastroesophageal reflux disease) 06/29/2017   Left arm numbness    Cerebral embolism with cerebral infarction 06/17/2017   TIA (transient ischemic attack) 06/17/2017   Chronic back pain 12/29/2016   Depression 12/15/2016   Vitamin D deficiency 12/09/2016   Right hip pain 12/07/2016    Hypokalemia 12/07/2016   Type 2 diabetes mellitus with vascular disease ([place]) 05/31/2016   Normocytic normochromic anemia 05/31/2016   Chest pain 05/31/2016   Coronary artery disease involving native coronary artery of native heart without angina pectoris 01/06/2016   Lactic acidosis 05/28/2014   Nonspecific chest pain 01/29/2014   Uncontrolled secondary diabetes with peripheral neuropathy ([place]) 01/29/2014   Obesity, Class III, BMI 40-49.9 (morbid obesity) ([place]) 01/29/2014   Snoring 01/29/2014   Dyslipidemia 01/29/2014   HTN (hypertension) 01/29/2014   Abnormal nuclear stress test 01/29/2014    Past Surgical History:  Procedure Laterality Date   CARDIAC CATHETERIZATION N/A 05/31/2016   Procedure: Left Heart Cath and Coronary Angiography;  Surgeon: Peter M Martinique, MD;  Location: [place] CV LAB;  Service: Cardiovascular;  Laterality: N/A;   CARDIAC CATHETERIZATION N/A 05/31/2016   Procedure: Intravascular Pressure Wire/FFR Study;  Surgeon: Peter M Martinique, MD;  Location: [place] CV LAB;  Service: Cardiovascular;  Laterality: N/A;   CARDIAC CATHETERIZATION N/A 05/31/2016   Procedure: Coronary Stent Intervention;  Surgeon: Peter M Martinique, MD;  Location: [place] CV LAB;  Service: Cardiovascular;  Laterality: N/A;   COLONOSCOPY N/A 07/17/2021   Procedure: COLONOSCOPY;  Surgeon: Wilford Corner, MD;  Location: [facility] ENDOSCOPY;  Service: Endoscopy;  Laterality: N/A;   COLONOSCOPY WITH PROPOFOL N/A 04/29/2020   Procedure: COLONOSCOPY WITH PROPOFOL;  Surgeon: Wilford Corner, MD;  Location: [place];  Service: Endoscopy;  Laterality: N/A;   ESOPHAGOGASTRODUODENOSCOPY N/A 04/29/2020   Procedure: ESOPHAGOGASTRODUODENOSCOPY (EGD);  Surgeon: Wilford Corner, MD;  Location: [place];  Service: Endoscopy;  Laterality: N/A;   ESOPHAGOGASTRODUODENOSCOPY (EGD) WITH PROPOFOL N/A 07/17/2021   Procedure: ESOPHAGOGASTRODUODENOSCOPY (EGD) WITH PROPOFOL;  Surgeon: Wilford Corner, MD;  Location:  [facility] ENDOSCOPY;  Service: Endoscopy;  Laterality: N/A;   LEFT HEART CATH AND CORONARY ANGIOGRAPHY N/A 12/08/2017   Procedure: LEFT HEART CATH AND CORONARY ANGIOGRAPHY;  Surgeon: Leonie Man, MD;  Location: [place] CV LAB;  Service: Cardiovascular;  Laterality: N/A;   LEFT HEART CATHETERIZATION WITH CORONARY ANGIOGRAM N/A 02/03/2014   Procedure: LEFT HEART CATHETERIZATION WITH CORONARY ANGIOGRAM;  Surgeon: Pixie Casino, MD;  Location[facility] CATH LAB;  Service: Cardiovascular;  Laterality: N/A;   left leg stent      POLYPECTOMY  04/29/2020   Procedure: POLYPECTOMY;  Surgeon: Wilford Corner, MD;  Location: [facility] ENDOSCOPY;  Service: Endoscopy;;   POLYPECTOMY  07/17/2021   Procedure: POLYPECTOMY;  Surgeon: Wilford Corner, MD;  Location: [facility] ENDOSCOPY;  Service: Endoscopy;;       Family History  Problem Relation Age of Onset   Cancer Father    Hypertension Mother    Diabetes Mother    Breast cancer Mother    Hypertension Brother    Diabetes Brother    Hypertension Sister    Diabetes Sister     Social History   Tobacco Use  Smoking status: Never   Smokeless tobacco: Never  Vaping Use   Vaping Use: Never used  Substance Use Topics   Alcohol use: No   Drug use: No    Home Medications Prior to Admission medications   Medication Sig Start Date End Date Taking? Authorizing Provider  albuterol (VENTOLIN HFA) 108 (90 Base) MCG/ACT inhaler Inhale 2 puffs into the lungs every 6 (six) hours as needed for wheezing or shortness of breath.   Yes [provider]  apixaban (ELIQUIS) 5 MG TABS tablet Take 2 tablets (10mg ) twice daily for 7 days, then 1 tablet (5mg ) twice daily Patient taking differently: Take 5 mg by mouth 2 (two) times daily. 07/04/21  Yes Little Ishikawa, MD  atorvastatin (LIPITOR) 40 MG tablet Take 40 mg by mouth at bedtime.   Yes [provider]  carvedilol (COREG) 12.5 MG tablet Take 12.5 mg by mouth 2 (two) times daily. 10/22/20  Yes [provider]  diclofenac (FLECTOR) 1.3 % PTCH Place 1 patch onto the skin 2 (two) times daily as needed (back and knee pain).   Yes [provider]  fenofibrate (TRICOR) 145 MG tablet Take 1 tablet (145 mg total) by mouth daily. Patient taking differently: Take 145 mg by mouth every morning. 06/25/21  Yes Cleaver, Jossie Ng, NP  furosemide (LASIX) 40 MG tablet Take 1 tablet (40 mg total) by mouth daily. PLEASE RESUME ONLY FROM 10/21/2020. DOSE HAS BEEN CHANGED. Patient taking differently: Take 40 mg by mouth 3 (three) times daily. 10/21/20 10/21/21 Yes Bonnielee Haff, MD  gabapentin (NEURONTIN) 800 MG tablet Take 800 mg by mouth 3 (three) times daily. 09/29/21  Yes [provider]  glipiZIDE (GLUCOTROL) 10 MG tablet Take 10 mg by mouth 2 (two) times daily. 05/30/17  Yes [provider]  HUMULIN R U-500 KWIKPEN 500 UNIT/ML kwikpen Inject 80-95 Units into the skin in the morning, at noon, and at bedtime. Per sliding scale based on CBG 06/04/20  Yes [provider]  ipratropium-albuterol (DUONEB) 0.5-2.5 (3) MG/3ML SOLN Take 3 mLs by nebulization 2 (two) times daily. 06/24/21  Yes [provider]  isosorbide mononitrate (IMDUR) 30 MG 24 hr tablet TAKE 1 TABLET BY MOUTH DAILY Patient taking differently: Take 30 mg by mouth every morning. 12/24/20  Yes Hilty, Nadean Corwin, MD  lisinopril (ZESTRIL) 5 MG tablet Take 5 mg by mouth every morning. 12/04/20  Yes [provider]  Menthol, Topical Analgesic, (BIOFREEZE EX) Apply 1 application topically 2 (two) times daily as needed (knee pain).   Yes [provider]  nitroGLYCERIN (NITROSTAT) 0.4 MG SL tablet Place 1 tablet (0.4 mg total) under the tongue every 5 (five) minutes as needed for chest pain. 02/06/19  Yes Skeet Latch, MD  ondansetron (ZOFRAN) 4 MG tablet Take 4 mg by mouth 2 (two) times daily as needed for nausea/vomiting. 04/21/20  Yes [provider]  Oxycodone HCl 20 MG TABS Take 20 mg  by mouth 3 (three) times daily as needed (pain).   Yes [provider]  pantoprazole (PROTONIX) 40 MG tablet Take 40 mg by mouth every morning. 04/13/21  Yes [provider]  potassium chloride (MICRO-K) 10 MEQ CR capsule Take 20 mEq by mouth 2 (two) times daily. 04/03/20  Yes [provider]  sertraline (ZOLOFT) 50 MG tablet Take 50 mg by mouth every morning. 12/17/20  Yes [provider]  tiZANidine (ZANAFLEX) 4 MG tablet Take 4 mg by mouth at bedtime as needed for muscle  spasms. 03/31/20  Yes [provider]  traZODone (DESYREL) 50 MG tablet Take 1 tablet (50 mg total) by mouth at bedtime. 12/15/16  Yes Burns, Arloa Koh, MD  Vitamin D, Ergocalciferol, (DRISDOL) 1.25 MG (50000 UT) CAPS capsule Take 50,000 Units by mouth every Monday. 08/15/19  Yes [provider]  Accu-Chek FastClix Lancets MISC 1 Device by Other route 2 (two) times daily.  03/20/19   [provider]  ACCU-CHEK GUIDE test strip 1 each by Other route 2 (two) times daily. 01/08/21   [provider]  Insulin Pen Needle 31G X 5 MM MISC Use 1 needle daily to inject insulin as prescribed 06/18/17   Barton Dubois, MD    Allergies    Coconut flavor [flavoring agent], Coconut oil, Ibuprofen, Aleve [naproxen], Nsaids, and Vascepa [icosapent ethyl]  Review of Systems   Review of Systems  Constitutional:  Positive for activity change.  Respiratory:  Positive for shortness of breath.   Cardiovascular:  Positive for chest pain.  Gastrointestinal:  Negative for nausea and vomiting.  Neurological:  Negative for dizziness.  All other systems reviewed and are negative.  Physical Exam Updated Vital Signs BP 129/70    Pulse 74    Temp 98.7 F (37.1 C) (Oral)    Resp (!) 23    Ht 5\' 10"  (1.778 m)    Wt (!) 154.2 kg    SpO2 95%    BMI 48.78 kg/m   Physical Exam Vitals and nursing note reviewed.  Constitutional:      Appearance: He is well-developed.  HENT:     Head:  Atraumatic.  Cardiovascular:     Rate and Rhythm: Normal rate.  Pulmonary:     Effort: Pulmonary effort is normal.     Breath sounds: No decreased breath sounds, wheezing, rhonchi or rales.  Musculoskeletal:     Cervical back: Neck supple.  Skin:    General: Skin is warm.  Neurological:     Mental Status: He is alert and oriented to person, place, and time.    ED Results / Procedures / Treatments   Labs (all labs ordered are listed, but only abnormal results are displayed) Labs Reviewed  BASIC METABOLIC PANEL - Abnormal; Notable for the following components:      Result Value   Glucose, Bld 311 (*)    Calcium 8.7 (*)    All other components within normal limits  CBC WITH DIFFERENTIAL/PLATELET - Abnormal; Notable for the following components:   RBC 4.06 (*)    Hemoglobin 12.3 (*)    HCT 37.8 (*)    All other components within normal limits  RESP PANEL BY RT-PCR (FLU A&B, COVID) ARPGX2  BRAIN NATRIURETIC PEPTIDE  TROPONIN I (HIGH SENSITIVITY)  TROPONIN I (HIGH SENSITIVITY)    EKG EKG Interpretation  Date/Time:  Saturday October 16 2021 20:06:38 EST Ventricular Rate:  78 PR Interval:  178 QRS Duration: 82 QT Interval:  366 QTC Calculation: 417 R Axis:   2 Text Interpretation: Sinus rhythm Consider anterior infarct Nonspecific T abnormalities, lateral leads No acute changes No significant change since last tracing Confirmed by Varney Biles ([id number]) on 10/16/2021 9:24:50 PM  Radiology DG Chest Port 1 View  Result Date: 10/16/2021 CLINICAL DATA:  Shortness of breath EXAM: PORTABLE CHEST 1 VIEW COMPARISON:  07/15/2021 FINDINGS: Cardiomegaly. Both lungs are clear. The visualized skeletal structures are unremarkable. IMPRESSION: Cardiomegaly without acute abnormality of the lungs in AP portable projection. Electronically Signed   By: Jamse Mead.D.  On: 10/16/2021 21:24    Procedures Procedures   Medications Ordered in ED Medications - No data to display  ED  Course  I have reviewed the triage vital signs and the nursing notes.  Pertinent labs & imaging results that were available during my care of the patient were reviewed by me and considered in my medical decision making (see chart for details).    MDM Rules/Calculators/A&P                         60 year old male with history of CAD, CHF, PE on Eliquis comes in with chief complaint of shortness of breath.  His lung exam are clear.  Patient is not hypoxic.  He is currently not on any oxygen during my assessment.  Concerns for COVID-19, flu.  Other possibilities include CHF exacerbation, ACS and PE.  Patient's chart indicates that he just had a CT PE done last week, it was negative for any acute embolism.  My pretest probability is quite low for PE, in the setting of recent CT PE being negative, we do not think we need to pursue PE work-up at this time.  Patient is initial troponin is negative.  EKG is reassuring.  If the second troponin is negative, patient will be discharged.     Final Clinical Impression(s) / ED Diagnoses Final diagnoses:  None    Rx / DC Orders ED Discharge Orders     None        Varney Biles, MD 10/16/21 [id number]

## 2021-10-16 NOTE — ED Notes (Signed)
Pt given phone to call son 

## 2021-10-17 LAB — RESP PANEL BY RT-PCR (FLU A&B, COVID) ARPGX2
Influenza A by PCR: NEGATIVE
Influenza B by PCR: NEGATIVE
SARS Coronavirus 2 by RT PCR: NEGATIVE

## 2021-10-17 LAB — TROPONIN I (HIGH SENSITIVITY): Troponin I (High Sensitivity): 8 ng/L (ref <18)

## 2021-10-17 NOTE — ED Provider Notes (Signed)
Repeat troponin flat.  COVID and flu swabs are negative.  Recent CT PE study was negative and he denies any missed doses of Eliquis  -Work-up reassuring without evidence of ACS.  Lungs are clear.  He is anxious to go home.  Stable for discharge per plan established by Dr. Kathrynn Humble.  Return to the ED with exertional chest pani, worsening shortness of breath, diaphoresis, vomiting or any other concerns.    Juan Essex, MD 10/18/21 [phone]

## 2021-10-22 ENCOUNTER — Telehealth: Payer: Self-pay | Admitting: Internal Medicine

## 2021-10-22 ENCOUNTER — Telehealth (HOSPITAL_COMMUNITY): Payer: Self-pay | Admitting: Family Medicine

## 2021-10-22 NOTE — Telephone Encounter (Signed)
Tried to call pt x2 he says hello then hangs up. Will have to try again later

## 2021-10-22 NOTE — Progress Notes (Deleted)
HPI: FU CAD and diastolic CHF. Pt had DES to LAD 8/17. CTA 9/17 showed no pulmonary embolus. Cardiac catheterization February 2019 showed patent stents and normal LV function. Left ventricular end-diastolic pressure elevated at 27 mmHg. Carotid Dopplers April 2019 showed 1 to 39% right and near normal left carotid. CTA August 2019 showed no thoracic aortic aneurysm.  Patient seen with syncope December 2021.  Felt likely to be orthostatic mediated.  Echocardiogram September 2022 showed ejection fraction 60 to 65%, moderate left ventricular hypertrophy.  Had pulmonary embolus September 2022.  Had GI bleed on anticoagulation.  EGD showed gastritis.  Colonoscopy showed polyps, diverticulosis and hemorrhoid.  CTA December 2022 showed no pulmonary embolus (significantly reduced burden of pulmonary artery clot).  Since last seen   Current Outpatient Medications  Medication Sig Dispense Refill   Accu-Chek FastClix Lancets MISC 1 Device by Other route 2 (two) times daily.      ACCU-CHEK GUIDE test strip 1 each by Other route 2 (two) times daily.     albuterol (VENTOLIN HFA) 108 (90 Base) MCG/ACT inhaler Inhale 2 puffs into the lungs every 6 (six) hours as needed for wheezing or shortness of breath.     apixaban (ELIQUIS) 5 MG TABS tablet Take 2 tablets (10mg ) twice daily for 7 days, then 1 tablet (5mg ) twice daily (Patient taking differently: Take 5 mg by mouth 2 (two) times daily.) 60 tablet 0   atorvastatin (LIPITOR) 40 MG tablet Take 40 mg by mouth at bedtime.     carvedilol (COREG) 12.5 MG tablet Take 12.5 mg by mouth 2 (two) times daily.     diclofenac (FLECTOR) 1.3 % PTCH Place 1 patch onto the skin 2 (two) times daily as needed (back and knee pain).     fenofibrate (TRICOR) 145 MG tablet Take 1 tablet (145 mg total) by mouth daily. (Patient taking differently: Take 145 mg by mouth every morning.) 90 tablet 2   furosemide (LASIX) 40 MG tablet Take 1 tablet (40 mg total) by mouth daily. PLEASE  RESUME ONLY FROM 10/21/2020. DOSE HAS BEEN CHANGED. (Patient taking differently: Take 40 mg by mouth 3 (three) times daily.) 90 tablet 0   gabapentin (NEURONTIN) 800 MG tablet Take 800 mg by mouth 3 (three) times daily.     glipiZIDE (GLUCOTROL) 10 MG tablet Take 10 mg by mouth 2 (two) times daily.  4   HUMULIN R U-500 KWIKPEN 500 UNIT/ML kwikpen Inject 80-95 Units into the skin in the morning, at noon, and at bedtime. Per sliding scale based on CBG     Insulin Pen Needle 31G X 5 MM MISC Use 1 needle daily to inject insulin as prescribed 100 each 2   ipratropium-albuterol (DUONEB) 0.5-2.5 (3) MG/3ML SOLN Take 3 mLs by nebulization 2 (two) times daily.     isosorbide mononitrate (IMDUR) 30 MG 24 hr tablet TAKE 1 TABLET BY MOUTH DAILY (Patient taking differently: Take 30 mg by mouth every morning.) 90 tablet 3   lisinopril (ZESTRIL) 5 MG tablet Take 5 mg by mouth every morning.     Menthol, Topical Analgesic, (BIOFREEZE EX) Apply 1 application topically 2 (two) times daily as needed (knee pain).     nitroGLYCERIN (NITROSTAT) 0.4 MG SL tablet Place 1 tablet (0.4 mg total) under the tongue every 5 (five) minutes as needed for chest pain. 100 tablet 1   ondansetron (ZOFRAN) 4 MG tablet Take 4 mg by mouth 2 (two) times daily as needed for nausea/vomiting.  Oxycodone HCl 20 MG TABS Take 20 mg by mouth 3 (three) times daily as needed (pain).     pantoprazole (PROTONIX) 40 MG tablet Take 40 mg by mouth every morning.     potassium chloride (MICRO-K) 10 MEQ CR capsule Take 20 mEq by mouth 2 (two) times daily.     sertraline (ZOLOFT) 50 MG tablet Take 50 mg by mouth every morning.     tiZANidine (ZANAFLEX) 4 MG tablet Take 4 mg by mouth at bedtime as needed for muscle spasms.     traZODone (DESYREL) 50 MG tablet Take 1 tablet (50 mg total) by mouth at bedtime. 30 tablet 11   Vitamin D, Ergocalciferol, (DRISDOL) 1.25 MG (50000 UT) CAPS capsule Take 50,000 Units by mouth every Monday.     No current  facility-administered medications for this visit.     Past Medical History:  Diagnosis Date   Anemia    Arthritis    Back pain    CAD (coronary artery disease)    a. s/p DES to LAD in 05/2016   Cervical radiculopathy    Chronic diastolic CHF (congestive heart failure) (HCC)    Chronic pain    Depression    DVT (deep venous thrombosis) (HCC)    Hematemesis    Hepatic steatosis    Hyperlipidemia    Hypertension    IBS (irritable bowel syndrome)    Morbid obesity (HCC)    OSA (obstructive sleep apnea)    Pancreatitis    PE (pulmonary thromboembolism) ([place])    Peripheral neuropathy    PUD (peptic ulcer disease)    Renal disorder    Stroke [facility])    a. ?details unclear - not seen on imaging when he was admitted in 05/2017 for TIA symptoms which were felt due to cervical radiculopathy.   Thoracic aortic ectasia (HCC)    a. 4.3cm ectatic ascending thoracic aorta by CT 06/2017.    Type 2 diabetes mellitus ([place])     Past Surgical History:  Procedure Laterality Date   CARDIAC CATHETERIZATION N/A 05/31/2016   Procedure: Left Heart Cath and Coronary Angiography;  Surgeon: Peter M Martinique, MD;  Location: [place] [facility];  Service: Cardiovascular;  Laterality: N/A;   CARDIAC CATHETERIZATION N/A 05/31/2016   Procedure: Intravascular Pressure Wire/FFR Study;  Surgeon: Peter M Martinique, MD;  Location: [place] [facility];  Service: Cardiovascular;  Laterality: N/A;   CARDIAC CATHETERIZATION N/A 05/31/2016   Procedure: Coronary Stent Intervention;  Surgeon: Peter M Martinique, MD;  Location: [place] [facility];  Service: Cardiovascular;  Laterality: N/A;   COLONOSCOPY N/A 07/17/2021   Procedure: COLONOSCOPY;  Surgeon: Wilford Corner, MD;  Location: [facility];  Service: Endoscopy;  Laterality: N/A;   COLONOSCOPY WITH PROPOFOL N/A 04/29/2020   Procedure: COLONOSCOPY WITH PROPOFOL;  Surgeon: Wilford Corner, MD;  Location: [place];  Service: Endoscopy;  Laterality: N/A;    ESOPHAGOGASTRODUODENOSCOPY N/A 04/29/2020   Procedure: ESOPHAGOGASTRODUODENOSCOPY (EGD);  Surgeon: Wilford Corner, MD;  Location: [place];  Service: Endoscopy;  Laterality: N/A;   ESOPHAGOGASTRODUODENOSCOPY (EGD) WITH PROPOFOL N/A 07/17/2021   Procedure: ESOPHAGOGASTRODUODENOSCOPY (EGD) WITH PROPOFOL;  Surgeon: Wilford Corner, MD;  Location: [facility];  Service: Endoscopy;  Laterality: N/A;   LEFT HEART CATH AND CORONARY ANGIOGRAPHY N/A 12/08/2017   Procedure: LEFT HEART CATH AND CORONARY ANGIOGRAPHY;  Surgeon: Leonie Man, MD;  Location: [place] [facility];  Service: Cardiovascular;  Laterality: N/A;   LEFT HEART CATHETERIZATION WITH CORONARY ANGIOGRAM N/A 02/03/2014   Procedure: LEFT  HEART CATHETERIZATION WITH CORONARY ANGIOGRAM;  Surgeon: Pixie Casino, MD;  Location: [facility] CATH LAB;  Service: Cardiovascular;  Laterality: N/A;   left leg stent      POLYPECTOMY  04/29/2020   Procedure: POLYPECTOMY;  Surgeon: Wilford Corner, MD;  Location: [facility] ENDOSCOPY;  Service: Endoscopy;;   POLYPECTOMY  07/17/2021   Procedure: POLYPECTOMY;  Surgeon: Wilford Corner, MD;  Location: [facility] ENDOS[facility];  Service: Endoscopy;;    Social History   Socioeconomic History   Marital status: Single    Spouse name: Not on file   Number of children: 3   Years of education: 12   Highest education level: Not on file  Occupational History   Occupation: Disabled  Tobacco Use   Smoking status: Never   Smokeless tobacco: Never  Vaping Use   Vaping Use: Never used  Substance and Sexual Activity   Alcohol use: No   Drug use: No   Sexual activity: Not on file  Other Topics Concern   Not on file  Social History Narrative   Independent and ambulatory with cane.   Lives at home alone.   Right-handed.   1-2 cups caffeine per day.   Social Determinants of Health   Financial Resource Strain: Not on file  Food Insecurity: Not on file  Transportation Needs: Not on file  Physical Activity: Not on file   Stress: Not on file  Social Connections: Not on file  Intimate Partner Violence: Not on file    Family History  Problem Relation Age of Onset   Cancer Father    Hypertension Mother    Diabetes Mother    Breast cancer Mother    Hypertension Brother    Diabetes Brother    Hypertension Sister    Diabetes Sister     ROS: no fevers or chills, productive cough, hemoptysis, dysphasia, odynophagia, melena, hematochezia, dysuria, hematuria, rash, seizure activity, orthopnea, PND, pedal edema, claudication. Remaining systems are negative.  Physical Exam: Well-developed well-nourished in no acute distress.  Skin is warm and dry.  HEENT is normal.  Neck is supple.  Chest is clear to auscultation with normal expansion.  Cardiovascular exam is regular rate and rhythm.  Abdominal exam nontender or distended. No masses palpated. Extremities show no edema. neuro grossly intact  ECG- personally reviewed  A/P  1 coronary artery disease-continue statin.  No aspirin given need for anticoagulation.  2 recent pulmonary embolus-continue apixaban.  3 history of chronic chest pain-  4 chronic diastolic congestive heart failure-continue Lasix at present dose.  Check potassium, renal function and BNP.  5 hypertension-blood pressure controlled.  Continue present medical regimen and follow.  6 morbid obesity-we discussed the importance of diet, exercise and weight loss.  7 carotid artery disease-mild on most recent Dopplers.  Continue medical therapy.  8 dyspnea-this has been longstanding.  Likely multifactorial including recent pulmonary embolus, obesity hypoventilation syndrome, obstructive sleep apnea and chronic diastolic congestive heart failure.  Kirk Ruths, MD

## 2021-10-22 NOTE — Telephone Encounter (Signed)
For the last couple of weeks pt is reporting frequent cp and sob w/ increased activity.Marland Kitchen worried about worsening heart failure or pulmonary hypertension.... seen in ofc today w/ stable vital signs.. please advise

## 2021-10-24 ENCOUNTER — Encounter (HOSPITAL_COMMUNITY): Payer: Self-pay | Admitting: Emergency Medicine

## 2021-10-24 ENCOUNTER — Emergency Department (HOSPITAL_COMMUNITY)
Admission: EM | Admit: 2021-10-24 | Discharge: 2021-10-24 | Disposition: A | Discharge status: Home / Self Care | Payer: Medicaid Other | Attending: Emergency Medicine | Admitting: Emergency Medicine

## 2021-10-24 ENCOUNTER — Other Ambulatory Visit: Payer: Self-pay

## 2021-10-24 ENCOUNTER — Emergency Department (HOSPITAL_COMMUNITY): Payer: Medicaid Other

## 2021-10-24 DIAGNOSIS — Z794 Long term (current) use of insulin: Secondary | ICD-10-CM | POA: Diagnosis not present

## 2021-10-24 DIAGNOSIS — R0602 Shortness of breath: Secondary | ICD-10-CM | POA: Diagnosis present

## 2021-10-24 DIAGNOSIS — Z7901 Long term (current) use of anticoagulants: Secondary | ICD-10-CM | POA: Insufficient documentation

## 2021-10-24 DIAGNOSIS — I110 Hypertensive heart disease with heart failure: Secondary | ICD-10-CM | POA: Diagnosis not present

## 2021-10-24 DIAGNOSIS — M79661 Pain in right lower leg: Secondary | ICD-10-CM | POA: Diagnosis not present

## 2021-10-24 DIAGNOSIS — J069 Acute upper respiratory infection, unspecified: Secondary | ICD-10-CM | POA: Diagnosis not present

## 2021-10-24 DIAGNOSIS — Z20822 Contact with and (suspected) exposure to covid-19: Secondary | ICD-10-CM | POA: Diagnosis not present

## 2021-10-24 DIAGNOSIS — I509 Heart failure, unspecified: Secondary | ICD-10-CM | POA: Diagnosis not present

## 2021-10-24 DIAGNOSIS — M79662 Pain in left lower leg: Secondary | ICD-10-CM | POA: Diagnosis not present

## 2021-10-24 DIAGNOSIS — I2510 Atherosclerotic heart disease of native coronary artery without angina pectoris: Secondary | ICD-10-CM | POA: Insufficient documentation

## 2021-10-24 DIAGNOSIS — I10 Essential (primary) hypertension: Secondary | ICD-10-CM | POA: Diagnosis not present

## 2021-10-24 DIAGNOSIS — Z79899 Other long term (current) drug therapy: Secondary | ICD-10-CM | POA: Insufficient documentation

## 2021-10-24 LAB — CBC WITH DIFFERENTIAL/PLATELET
Abs Immature Granulocytes: 0.02 10*3/uL (ref 0.00–0.07)
Basophils Absolute: 0 10*3/uL (ref 0.0–0.1)
Basophils Relative: 1 %
Eosinophils Absolute: 0.2 10*3/uL (ref 0.0–0.5)
Eosinophils Relative: 3 %
HCT: 38.3 % — ABNORMAL LOW (ref 39.0–52.0)
Hemoglobin: 12.5 g/dL — ABNORMAL LOW (ref 13.0–17.0)
Immature Granulocytes: 0 %
Lymphocytes Relative: 19 %
Lymphs Abs: 1.2 10*3/uL (ref 0.7–4.0)
MCH: 30.6 pg (ref 26.0–34.0)
MCHC: 32.6 g/dL (ref 30.0–36.0)
MCV: 93.9 fL (ref 80.0–100.0)
Monocytes Absolute: 0.7 10*3/uL (ref 0.1–1.0)
Monocytes Relative: 11 %
Neutro Abs: 4.3 10*3/uL (ref 1.7–7.7)
Neutrophils Relative %: 66 %
Platelets: 220 10*3/uL (ref 150–400)
RBC: 4.08 MIL/uL — ABNORMAL LOW (ref 4.22–5.81)
RDW: 13.5 % (ref 11.5–15.5)
WBC: 6.5 10*3/uL (ref 4.0–10.5)
nRBC: 0 % (ref 0.0–0.2)

## 2021-10-24 LAB — BASIC METABOLIC PANEL WITH GFR
Anion gap: 13 (ref 5–15)
BUN: 23 mg/dL — ABNORMAL HIGH (ref 6–20)
CO2: 24 mmol/L (ref 22–32)
Calcium: 8.9 mg/dL (ref 8.9–10.3)
Chloride: 101 mmol/L (ref 98–111)
Creatinine, Ser: 1.14 mg/dL (ref 0.61–1.24)
GFR, Estimated: >60 mL/min
Glucose, Bld: 206 mg/dL — ABNORMAL HIGH (ref 70–99)
Potassium: 3.9 mmol/L (ref 3.5–5.1)
Sodium: 138 mmol/L (ref 135–145)

## 2021-10-24 LAB — RESP PANEL BY RT-PCR (FLU A&B, COVID) ARPGX2
Influenza A by PCR: NEGATIVE
Influenza B by PCR: NEGATIVE
SARS Coronavirus 2 by RT PCR: NEGATIVE

## 2021-10-24 LAB — TROPONIN I (HIGH SENSITIVITY)
Troponin I (High Sensitivity): 6 ng/L (ref <18)
Troponin I (High Sensitivity): 7 ng/L

## 2021-10-24 LAB — BRAIN NATRIURETIC PEPTIDE: B Natriuretic Peptide: 90.2 pg/mL (ref 0.0–100.0)

## 2021-10-24 MED ORDER — BENZONATATE 100 MG PO CAPS: 100.0000 mg | ORAL_CAPSULE | Freq: Three times a day (TID) | ORAL | 0 refills | Status: DC

## 2021-10-24 MED ORDER — GUAIFENESIN ER 1200 MG PO TB12: 1.0000 | ORAL_TABLET | Freq: Two times a day (BID) | ORAL | 0 refills | Status: AC

## 2021-10-24 NOTE — ED Provider Notes (Signed)
[facility] EMERGENCY DEPARTMENT Provider Note   CSN: [id number] Arrival date & time: 10/24/21  3220     History  Chief Complaint  Patient presents with   Chest Pain   Shortness of Breath   Cough    [name] is a 61 y.o. male.   Chest Pain Associated symptoms: cough and shortness of breath   Shortness of Breath Associated symptoms: chest pain and cough   Cough Associated symptoms: chest pain and shortness of breath    Patient has a history of hypertension, diabetes chronic CHF, prior stroke, pulmonary embolism and coronary artery disease.  Patient was recently evaluated earlier this month for difficulties with shortness of breath and chest discomfort.  He had a negative work-up for PE or ACS.  Patient states in the last few days he has had trouble with coughing and congestion.  He is having a sharp pain in his chest when he coughs.  He does not feel like this is related to his heart.  He is not having any vomiting or or diarrhea.  He does have chronic leg swelling but has not noticed any new changes.  Patient was worried he could be developing a pneumonia so wanted to get it checked out.  Home Medications Prior to Admission medications   Medication Sig Start Date End Date Taking? Authorizing Provider  benzonatate (TESSALON) 100 MG capsule Take 1 capsule (100 mg total) by mouth every 8 (eight) hours. 10/24/21  Yes Dorie Rank, MD  Guaifenesin 1200 MG TB12 Take 1 tablet (1,200 mg total) by mouth 2 (two) times daily at 10 AM and 5 PM for 7 days. 10/24/21 10/31/21 Yes Dorie Rank, MD  Accu-Chek FastClix Lancets MISC 1 Device by Other route 2 (two) times daily.  03/20/19   [provider]  ACCU-CHEK GUIDE test strip 1 each by Other route 2 (two) times daily. 01/08/21   [provider]  albuterol (VENTOLIN HFA) 108 (90 Base) MCG/ACT inhaler Inhale 2 puffs into the lungs every 6 (six) hours as needed for wheezing or shortness of breath.    [provider]  apixaban (ELIQUIS) 5 MG TABS tablet Take 2 tablets (10mg ) twice daily for 7 days, then 1 tablet (5mg ) twice daily Patient taking differently: Take 5 mg by mouth 2 (two) times daily. 07/04/21   Little Ishikawa, MD  atorvastatin (LIPITOR) 40 MG tablet Take 40 mg by mouth at bedtime.    [provider]  carvedilol (COREG) 12.5 MG tablet Take 12.5 mg by mouth 2 (two) times daily. 10/22/20   [provider]  diclofenac (FLECTOR) 1.3 % PTCH Place 1 patch onto the skin 2 (two) times daily as needed (back and knee pain).    [provider]  fenofibrate (TRICOR) 145 MG tablet Take 1 tablet (145 mg total) by mouth daily. Patient taking differently: Take 145 mg by mouth every morning. 06/25/21   Deberah Pelton, NP  furosemide (LASIX) 40 MG tablet Take 1 tablet (40 mg total) by mouth daily. PLEASE RESUME ONLY FROM 10/21/2020. DOSE HAS BEEN CHANGED. Patient taking differently: Take 40 mg by mouth 3 (three) times daily. 10/21/20 10/21/21  Bonnielee Haff, MD  gabapentin (NEURONTIN) 800 MG tablet Take 800 mg by mouth 3 (three) times daily. 09/29/21   [provider]  glipiZIDE (GLUCOTROL) 10 MG tablet Take 10 mg by mouth 2 (two) times daily. 05/30/17   [provider]  HUMULIN R U-500 KWIKPEN 500 UNIT/ML kwikpen Inject 80-95  Units into the skin in the morning, at noon, and at bedtime. Per sliding scale based on CBG 06/04/20   [provider]  Insulin Pen Needle 31G X 5 MM MISC Use 1 needle daily to inject insulin as prescribed 06/18/17   Barton Dubois, MD  ipratropium-albuterol (DUONEB) 0.5-2.5 (3) MG/3ML SOLN Take 3 mLs by nebulization 2 (two) times daily. 06/24/21   [provider]  isosorbide mononitrate (IMDUR) 30 MG 24 hr tablet TAKE 1 TABLET BY MOUTH DAILY Patient taking differently: Take 30 mg by mouth every morning. 12/24/20   Hilty, Nadean Corwin, MD  lisinopril (ZESTRIL) 5 MG tablet Take 5 mg by mouth every morning. 12/04/20    [provider]  Menthol, Topical Analgesic, (BIOFREEZE EX) Apply 1 application topically 2 (two) times daily as needed (knee pain).    [provider]  nitroGLYCERIN (NITROSTAT) 0.4 MG SL tablet Place 1 tablet (0.4 mg total) under the tongue every 5 (five) minutes as needed for chest pain. 02/06/19   Skeet Latch, MD  ondansetron (ZOFRAN) 4 MG tablet Take 4 mg by mouth 2 (two) times daily as needed for nausea/vomiting. 04/21/20   [provider]  Oxycodone HCl 20 MG TABS Take 20 mg by mouth 3 (three) times daily as needed (pain).    [provider]  pantoprazole (PROTONIX) 40 MG tablet Take 40 mg by mouth every morning. 04/13/21   [provider]  potassium chloride (MICRO-K) 10 MEQ CR capsule Take 20 mEq by mouth 2 (two) times daily. 04/03/20   [provider]  sertraline (ZOLOFT) 50 MG tablet Take 50 mg by mouth every morning. 12/17/20   [provider]  tiZANidine (ZANAFLEX) 4 MG tablet Take 4 mg by mouth at bedtime as needed for muscle spasms. 03/31/20   [provider]  traZODone (DESYREL) 50 MG tablet Take 1 tablet (50 mg total) by mouth at bedtime. 12/15/16   Florinda Marker, MD  Vitamin D, Ergocalciferol, (DRISDOL) 1.25 MG (50000 UT) CAPS capsule Take 50,000 Units by mouth every Monday. 08/15/19   [provider]      Allergies    Coconut flavor [flavoring agent], Coconut oil, Ibuprofen, Aleve [naproxen], Nsaids, and Vascepa [icosapent ethyl]    Review of Systems   Review of Systems  Respiratory:  Positive for cough and shortness of breath.   Cardiovascular:  Positive for chest pain.   Physical Exam Updated Vital Signs BP (!) 143/80    Pulse 68    Temp 98.5 F (36.9 C)    Resp 17    SpO2 97%  Physical Exam Vitals and nursing note reviewed.  Constitutional:      General: He is not in acute distress.    Appearance: He is well-developed.     Comments: Elevated BMI  HENT:     Head: Normocephalic and  atraumatic.     Right Ear: External ear normal.     Left Ear: External ear normal.  Eyes:     General: No scleral icterus.       Right eye: No discharge.        Left eye: No discharge.     Conjunctiva/sclera: Conjunctivae normal.  Neck:     Trachea: No tracheal deviation.  Cardiovascular:     Rate and Rhythm: Normal rate and regular rhythm.  Pulmonary:     Effort: Pulmonary effort is normal. No respiratory distress.     Breath sounds: Normal breath sounds. No stridor. No wheezing or rales.  Abdominal:     General: Bowel sounds are normal. There is no distension.     Palpations: Abdomen is soft.     Tenderness: There is no abdominal tenderness. There is no guarding or rebound.  Musculoskeletal:        General: No deformity.     Cervical back: Neck supple.     Right lower leg: Tenderness present.     Left lower leg: Tenderness present.  Skin:    General: Skin is warm and dry.     Findings: No rash.  Neurological:     General: No focal deficit present.     Mental Status: He is alert.     Cranial Nerves: No cranial nerve deficit (no facial droop, extraocular movements intact, no slurred speech).     Sensory: No sensory deficit.     Motor: No abnormal muscle tone or seizure activity.     Coordination: Coordination normal.  Psychiatric:        Mood and Affect: Mood normal.    ED Results / Procedures / Treatments   Labs (all labs ordered are listed, but only abnormal results are displayed) Labs Reviewed  BASIC METABOLIC PANEL - Abnormal; Notable for the following components:      Result Value   Glucose, Bld 206 (*)    BUN 23 (*)    All other components within normal limits  CBC WITH DIFFERENTIAL/PLATELET - Abnormal; Notable for the following components:   RBC 4.08 (*)    Hemoglobin 12.5 (*)    HCT 38.3 (*)    All other components within normal limits  RESP PANEL BY RT-PCR (FLU A&B, COVID) ARPGX2  BRAIN NATRIURETIC PEPTIDE  TROPONIN I (HIGH SENSITIVITY)  TROPONIN I  (HIGH SENSITIVITY)    EKG None  Radiology DG Chest 2 View  Result Date: 10/24/2021 CLINICAL DATA:  Cough and chest pain. EXAM: CHEST - 2 VIEW COMPARISON:  October 16, 2021 FINDINGS: The heart size and mediastinal contours are stable. The heart size is upper limits of normal. Both lungs are clear. The visualized skeletal structures are stable. IMPRESSION: No active cardiopulmonary disease. Electronically Signed   By: Abelardo Diesel M.D.   On: 10/24/2021 10:21    Procedures Procedures    Medications Ordered in ED Medications - No data to display  ED Course/ Medical Decision Making/ A&P Clinical Course as of 10/24/21 1440  Sun Oct 24, 2021  1331 CBC normal.  Metabolic panel notable for hyperglycemia but doubt this is clinically significant.  COVID and flu are negative.  Initial troponin normal.  Initial chest x-ray independently reviewed by me does not show evidence of pneumonia [JK]  1426 2nd troponin is normal. [JK]    Clinical Course User Index [JK] Dorie Rank, MD                           Medical Decision Making  Patient presented with URI symptoms.  History of heart disease but symptoms not suggestive of cardiac etiology.  Serial troponins were normal.  Doubt ACS.  Patient also has history of pulmonary embolism but has been taking his medications.  Is not short of breath or tachycardic.  I doubt PE.  UA does not show pneumonia.  Suspect his symptoms and chest discomfort related to his URI.  Patient appears stable for discharge.  We will give him a prescription for Tessalon and guaifenesin        Final Clinical Impression(s) / ED Diagnoses  Final diagnoses:  Upper respiratory tract infection, unspecified type    Rx / DC Orders ED Discharge Orders          Ordered    benzonatate (TESSALON) 100 MG capsule  Every 8 hours        10/24/21 1438    Guaifenesin 1200 MG TB12  2 times daily        10/24/21 1438              Dorie Rank, MD 10/24/21 1440

## 2021-10-24 NOTE — Discharge Instructions (Signed)
Take the medications as prescribed.  Follow-up with your doctor to be rechecked if the symptoms persist.

## 2021-10-24 NOTE — ED Triage Notes (Signed)
Pt reports ongoing chest pain since October.  Reports increased SOB, productive cough with yellow phlegm, and body aches x 2 days.

## 2021-10-24 NOTE — ED Provider Notes (Signed)
Emergency Medicine Provider Triage Evaluation Note  [name] , a 61 y.o. male  was evaluated in triage.  Pt complains of cough and shortness of breath.  Patient reports the symptoms started about 2 days ago.  He reports cough is productive of yellow sputum.  With this he has had some central chest discomfort that is worse with cough.  He also reports associated shortness of breath.  No known fevers or sick contacts.  History of PE but reports he has been compliant with his Eliquis and not missed any doses.  He was evaluated on 12/24 for shortness of breath and had reassuring work-up at that time.  Review of Systems  Positive: Cough, shortness of breath, chest pain Negative: Fever, abdominal pain, vomiting.  Physical Exam  BP (!) 137/55 (BP Location: Left Arm)    Pulse 78    Temp 98.6 F (37 C) (Oral)    Resp 20    SpO2 99%  Gen:   Awake, no distress   Resp:  Normal effort, faint crackles in the left lower lung MSK:   Moves extremities without difficulty  Other:    Medical Decision Making  Medically screening exam initiated at 9:41 AM.  Appropriate orders placed.  IZAAH WESTMAN was informed that the remainder of the evaluation will be completed by another provider, this initial triage assessment does not replace that evaluation, and the importance of remaining in the ED until their evaluation is complete.     Jacqlyn Larsen, PA-C 10/24/21 [id number]    Lajean Saver, MD 10/25/21 0800

## 2021-10-26 NOTE — Progress Notes (Signed)
Cardiology Office Note:    Date:  10/27/2021   ID:  [name], DOB 05-25-1961, MRN [id number]  PCP:  [name], FNP  Cardiologist:  Kirk Ruths, MD  Electrophysiologist:  None   Referring MD: [name], FNP   Chief Complaint: shortness of breath and chest pain  History of Present Illness:    [name] is a 61 y.o. male with a history of CAD s/p DES to LAD in 8657, chronic diastolic CHF, mild carotid artery disease on dopplers in 01/2018, hypertension, hyperlipidemia, type 2 diabetes with peripheral neuropathy, prior stroke, prior DVT/PE on Eliquis, recurrent GI bleed, obstructive sleep apnea, and morbid obesity who is followed by Dr. Stanford Breed presents today evaluation of shortness of breath and chest pain.  Patient primarily follows with Cardiology for CAD and chronic diastolic CHF.  He underwent PCI with DES to mid LAD in 2017.  Cardiac catheterization in 2019 showed patent LAD stent with otherwise only mild nonobstructive disease with 20% stenosis of proximal RCA. Echo in 09/2020, which was ordered for further evaluation of syncope, showed LVEF of 55 to 60% with normal wall motion and normal diastolic parameters.  Syncope was felt to be orthostatic mediated and Lasix was decreased. He was last seen by Coletta Memos, NP, in 01/2021, at which time he was doing well from a cardiac standpoint.  Since last visit, he was admitted multiple times and 06/2021.  Patient was admitted from 06/26/2021 to 06/29/2021 for acute hypoxic respiratory failure after after presenting with progressive shortness of breath following recent COVID diagnosis.  Chest CTA was positive for PE with moderate clot burden and evidence of right heart strain.  Echo showed EF of 55 -60% with normal wall motion and normal diastolic parameters as well as normal RV size and function and no significant valvular disease.  He was initially started on IV heparin and then transition to Eliquis prior to discharge. He  was readmitted from 06/30/2021 to 07/03/2021 with bright red stool per rectum. GI was consulted who recommend holding Eliquis until 07/04/2021 and then restarting Patient readmitted a 3rd time from  07/15/2021 to 07/18/2021 with several days of bright red blood per rectum and intermittent epistaxis. Hemoglobin was fortunately stable. Eliquis held and he was placed on IV Heparin instead. GI was consulted and patient underwent EGD/colonoscopy with no obvious source of bleeding. It was recommended that patient be transitioned to Lovenox or Coumadin for treatment of PE but patient declined both. Therefore, he was discharged with outpatient follow-up with HemOnc.  More recently patient has had problems with shortness of breath and intermittent chest pain. He was see in the ED on 10/16/2021 with reports of central chest pain and shortness of breath both at rest and with exertion. Chest CTA one week prior had shown no evidence of central PE with significantly reduced burden of pulmonary artery clot, essentially resolved, with questionable residual distal subsegmental clot within the right upper lobe, right middle lobe, and left lower lobe. Work-up was unremarkable. High-sensitivity troponin was negative x2. EKG showed no acute changes. Hemoglobin was stable. Chest x-ray showed no acute findings. Therefore, he was felt to be stable for discharge. He returned to the ED on 10/24/2021 with persistent shortness of breath, chest pain, and cough. Work-up was again unremarkable and included a normal BNP this time. Respiratory panel negative for COVID and influenza. Symptoms were felt to be due to URI. He was prescribed Tessalon and Guaifenesin and felt to be stable for discharge.  Patient presents today for follow-up. Here alone.  Patient reports shortness of breath both at rest and with exertion for the past several months but states he does feel like it has been worse lately.  He has had a significant cough for the past 3 to 4 days  as well as some nasal congestion.  He also reports chest pain for the past few months.  He describes this as a sharp stabbing pain under his left breast as well as in the center of his chest.  He initially thought this was due to to his PE but PE has almost completed resolved based on recent CTA and he is still having pain.  Pain occurs at rest and does not seem to be brought on by activity although he states he is not very active at all.  He has chronic orthopnea and has slept in a recliner for years but denies any PND.  He has chronic lower extremity edema which is stable.  His weight is up 12 pounds from his last office visit with [place] in April 2022; however, patient states he has been losing weight lately recently.  He does continue to have some bright red blood in his stools and is scheduled to see hematology later this week.  Past Medical History:  Diagnosis Date   Anemia    Arthritis    Back pain    CAD (coronary artery disease)    a. s/p DES to LAD in 05/2016   Cervical radiculopathy    Chronic diastolic CHF (congestive heart failure) (HCC)    Chronic pain    Depression    DVT (deep venous thrombosis) (HCC)    Hematemesis    Hepatic steatosis    Hyperlipidemia    Hypertension    IBS (irritable bowel syndrome)    Morbid obesity (HCC)    OSA (obstructive sleep apnea)    Pancreatitis    PE (pulmonary thromboembolism) ([place])    Peripheral neuropathy    PUD (peptic ulcer disease)    Renal disorder    Stroke [facility])    a. ?details unclear - not seen on imaging when he was admitted in 05/2017 for TIA symptoms which were felt due to cervical radiculopathy.   Thoracic aortic ectasia (HCC)    a. 4.3cm ectatic ascending thoracic aorta by CT 06/2017.    Type 2 diabetes mellitus ([place])     Past Surgical History:  Procedure Laterality Date   CARDIAC CATHETERIZATION N/A 05/31/2016   Procedure: Left Heart Cath and Coronary Angiography;  Surgeon: Peter M Martinique, MD;  Location: [place] [facility];   Service: Cardiovascular;  Laterality: N/A;   CARDIAC CATHETERIZATION N/A 05/31/2016   Procedure: Intravascular Pressure Wire/FFR Study;  Surgeon: Peter M Martinique, MD;  Location: [place] [facility];  Service: Cardiovascular;  Laterality: N/A;   CARDIAC CATHETERIZATION N/A 05/31/2016   Procedure: Coronary Stent Intervention;  Surgeon: Peter M Martinique, MD;  Location: [place] [facility];  Service: Cardiovascular;  Laterality: N/A;   COLONOSCOPY N/A 07/17/2021   Procedure: COLONOSCOPY;  Surgeon: Wilford Corner, MD;  Location: [facility];  Service: Endoscopy;  Laterality: N/A;   COLONOSCOPY WITH PROPOFOL N/A 04/29/2020   Procedure: COLONOSCOPY WITH PROPOFOL;  Surgeon: Wilford Corner, MD;  Location: [place];  Service: Endoscopy;  Laterality: N/A;   ESOPHAGOGASTRODUODENOSCOPY N/A 04/29/2020   Procedure: ESOPHAGOGASTRODUODENOSCOPY (EGD);  Surgeon: Wilford Corner, MD;  Location: [place];  Service: Endoscopy;  Laterality: N/A;   ESOPHAGOGASTRODUODENOSCOPY (EGD) WITH PROPOFOL N/A 07/17/2021  Procedure: ESOPHAGOGASTRODUODENOSCOPY (EGD) WITH PROPOFOL;  Surgeon: Wilford Corner, MD;  Location: [facility];  Service: Endoscopy;  Laterality: N/A;   LEFT HEART CATH AND CORONARY ANGIOGRAPHY N/A 12/08/2017   Procedure: LEFT HEART CATH AND CORONARY ANGIOGRAPHY;  Surgeon: Leonie Man, MD;  Location: [place] [facility];  Service: Cardiovascular;  Laterality: N/A;   LEFT HEART CATHETERIZATION WITH CORONARY ANGIOGRAM N/A 02/03/2014   Procedure: LEFT HEART CATHETERIZATION WITH CORONARY ANGIOGRAM;  Surgeon: Pixie Casino, MD;  Location: [facility] C[facility] LAB;  Service: Cardiovascular;  Laterality: N/A;   left leg stent      POLYPECTOMY  04/29/2020   Procedure: POLYPECTOMY;  Surgeon: Wilford Corner, MD;  Location: [facility] ENDOSCOPY;  Service: Endoscopy;;   POLYPECTOMY  07/17/2021   Procedure: POLYPECTOMY;  Surgeon: Wilford Corner, MD;  Location: [facility];  Service: Endoscopy;;    Current Medications: Current  Meds  Medication Sig   Accu-Chek FastClix Lancets MISC 1 Device by Other route 2 (two) times daily.    ACCU-CHEK GUIDE test strip 1 each by Other route 2 (two) times daily.   albuterol (VENTOLIN HFA) 108 (90 Base) MCG/ACT inhaler Inhale 2 puffs into the lungs every 6 (six) hours as needed for wheezing or shortness of breath.   apixaban (ELIQUIS) 5 MG TABS tablet Take 2 tablets (10mg ) twice daily for 7 days, then 1 tablet (5mg ) twice daily (Patient taking differently: Take 5 mg by mouth 2 (two) times daily.)   atorvastatin (LIPITOR) 40 MG tablet Take 40 mg by mouth at bedtime.   benzonatate (TESSALON) 100 MG capsule Take 1 capsule (100 mg total) by mouth every 8 (eight) hours.   carvedilol (COREG) 12.5 MG tablet Take 12.5 mg by mouth 2 (two) times daily.   diclofenac (FLECTOR) 1.3 % PTCH Place 1 patch onto the skin 2 (two) times daily as needed (back and knee pain).   fenofibrate (TRICOR) 145 MG tablet Take 1 tablet (145 mg total) by mouth daily. (Patient taking differently: Take 145 mg by mouth every morning.)   gabapentin (NEURONTIN) 800 MG tablet Take 800 mg by mouth 3 (three) times daily.   glipiZIDE (GLUCOTROL) 10 MG tablet Take 10 mg by mouth 2 (two) times daily.   Guaifenesin 1200 MG TB12 Take 1 tablet (1,200 mg total) by mouth 2 (two) times daily at 10 AM and 5 PM for 7 days.   HUMULIN R U-500 KWIKPEN 500 UNIT/ML kwikpen Inject 80-95 Units into the skin in the morning, at noon, and at bedtime. Per sliding scale based on CBG   Insulin Pen Needle 31G X 5 MM MISC Use 1 needle daily to inject insulin as prescribed   ipratropium-albuterol (DUONEB) 0.5-2.5 (3) MG/3ML SOLN Take 3 mLs by nebulization 2 (two) times daily.   isosorbide mononitrate (IMDUR) 30 MG 24 hr tablet TAKE 1 TABLET BY MOUTH DAILY (Patient taking differently: Take 30 mg by mouth every morning.)   lisinopril (ZESTRIL) 5 MG tablet Take 5 mg by mouth every morning.   Menthol, Topical Analgesic, (BIOFREEZE EX) Apply 1 application  topically 2 (two) times daily as needed (knee pain).   nitroGLYCERIN (NITROSTAT) 0.4 MG SL tablet Place 1 tablet (0.4 mg total) under the tongue every 5 (five) minutes as needed for chest pain.   ondansetron (ZOFRAN) 4 MG tablet Take 4 mg by mouth 2 (two) times daily as needed for nausea/vomiting.   Oxycodone HCl 20 MG TABS Take 20 mg by mouth 3 (three) times daily as needed (pain).   pantoprazole (PROTONIX) 40  MG tablet Take 40 mg by mouth every morning.   potassium chloride (MICRO-K) 10 MEQ CR capsule Take 20 mEq by mouth 2 (two) times daily.   sertraline (ZOLOFT) 50 MG tablet Take 50 mg by mouth every morning.   tiZANidine (ZANAFLEX) 4 MG tablet Take 4 mg by mouth at bedtime as needed for muscle spasms.   traZODone (DESYREL) 50 MG tablet Take 1 tablet (50 mg total) by mouth at bedtime.   Vitamin D, Ergocalciferol, (DRISDOL) 1.25 MG (50000 UT) CAPS capsule Take 50,000 Units by mouth every Monday.   [DISCONTINUED] furosemide (LASIX) 40 MG tablet Take 1 tablet (40 mg total) by mouth daily. PLEASE RESUME ONLY FROM 10/21/2020. DOSE HAS BEEN CHANGED. (Patient taking differently: Take 40 mg by mouth 3 (three) times daily.)     Allergies:   Coconut flavor [flavoring agent], Coconut oil, Ibuprofen, Aleve [naproxen], Nsaids, and Vascepa [icosapent ethyl]   Social History   Socioeconomic History   Marital status: Single    Spouse name: Not on file   Number of children: 3   Years of education: 12   Highest education level: Not on file  Occupational History   Occupation: Disabled  Tobacco Use   Smoking status: Never   Smokeless tobacco: Never  Vaping Use   Vaping Use: Never used  Substance and Sexual Activity   Alcohol use: No   Drug use: No   Sexual activity: Not on file  Other Topics Concern   Not on file  Social History Narrative   Independent and ambulatory with cane.   Lives at home alone.   Right-handed.   1-2 cups caffeine per day.   Social Determinants of Health   Financial  Resource Strain: Not on file  Food Insecurity: Not on file  Transportation Needs: Not on file  Physical Activity: Not on file  Stress: Not on file  Social Connections: Not on file     Family History: The patient's family history includes Breast cancer in his mother; Cancer in his father; Diabetes in his brother, mother, and sister; Hypertension in his brother, mother, and sister.  ROS:   Please see the history of present illness.     EKGs/Labs/Other Studies Reviewed:    The following studies were reviewed today:  Left Cardiac Catheterization 12/08/2017: Previously placed Prox LAD to Mid LAD stent (Synergy DES 4.0 mm x 16 mm) is widely patent. Prox RCA lesion is 20% stenosed. The left ventricular systolic function is normal. The left ventricular ejection fraction is 55-65% by visual estimate. LV end diastolic pressure is severely elevated. 27 mmHg The left ventricular ejection fraction is 55-65% by visual estimate.   Angiographically normal coronary arteries with widely patent LAD stent.  No significant lesions noted. Technically right dominant, but anatomically codominant.   Essential hypertension with elevated LVEDP.  This could potentially be the explanation for his symptoms.  Recommend continued aggressive blood pressure management and diuresis.  Diagnostic Dominance: Right  _______________  Carotid Ultrasound 01/24/2018: Final Interpretation: - Right Carotid: Velocities in the right ICA are consistent with a 1-39% stenosis. Non-hemodynamically significant plaque <50% noted in the CCA. The extracranial vessels were near-normal with only minimal wall thickening or plaque.  - Left Carotid: The extracranial vessels were near-normal with only minimal wall thickening or plaque. Minimum wall thickening noted in the  bulb/proximal ICA.  - Vertebrals:  Bilateral vertebral arteries demonstrate antegrade flow. Small caliber right vertebral artery.  - Subclavians: Normal flow  hemodynamics were seen in bilateral subclavian arteries.  _______________  Echocardiogram 06/28/2021: Impressions:  1. Left ventricular ejection fraction, by estimation, is 60 to 65%. The  left ventricle has normal function. The left ventricle has no regional  wall motion abnormalities. There is moderate left ventricular hypertrophy.   2. Right ventricular systolic function is normal. The right ventricular  size is normal.   3. The mitral valve is normal in structure. No evidence of mitral valve  regurgitation. No evidence of mitral stenosis.   4. The aortic valve is normal in structure. Aortic valve regurgitation is  not visualized. No aortic stenosis is present.   5. The inferior vena cava is dilated in size with >50% respiratory  variability, suggesting right atrial pressure of 8 mmHg.   EKG:  EKG  ordered today. EKG personally reviewed and demonstrates normal sinus rhythm, rate 84 bpm, with some underlying artifact and non-specific ST/T changes. No acute ischemic changes compared to prior tracing. Normal axis. Normal PR and QRS intervals. QTc 437 ms.  Recent Labs: 07/15/2021: ALT 26 07/17/2021: Magnesium 2.1 10/24/2021: B Natriuretic Peptide 90.2; BUN 23; Creatinine, Ser 1.14; Hemoglobin 12.5; Platelets 220; Potassium 3.9; Sodium 138  Recent Lipid Panel    Component Value Date/Time   CHOL 126 12/22/2020 0925   TRIG 418 (H) 12/22/2020 0925   HDL 29 (L) 12/22/2020 0925   CHOLHDL 4.3 12/22/2020 0925   CHOLHDL 4.3 03/08/2019 1008   VLDL 43 (H) 12/08/2017 0212   LDLCALC 37 12/22/2020 0925   LDLCALC  03/08/2019 1008     Comment:     . LDL cholesterol not calculated. Triglyceride levels greater than 400 mg/dL invalidate calculated LDL results. . Reference range: <100 . Desirable range <100 mg/dL for primary prevention;   <70 mg/dL for patients with CHD or diabetic patients  with > or = 2 CHD risk factors. Marland Kitchen LDL-C is now calculated using the Martin-Hopkins  calculation, which is a  validated novel method providing  better accuracy than the Friedewald equation in the  estimation of LDL-C.  Cresenciano Genre et al. Annamaria Helling. 0539;767(34): 2061-2068  (http://education.Quest[employer].com/faq/FAQ164)    LDLDIRECT 46 12/22/2020 0925    Physical Exam:    Vital Signs: BP 132/76    Pulse 84    Ht 5\' 10"  (1.778 m)    Wt (!) 338 lb 3.2 oz (153.4 kg)    SpO2 90%    BMI 48.53 kg/m     Wt Readings from Last 3 Encounters:  10/27/21 (!) 338 lb 3.2 oz (153.4 kg)  10/16/21 (!) 340 lb (154.2 kg)  07/30/21 (!) 339 lb 11.2 oz (154.1 kg)     General: 61 y.o. morbidly obese male in no acute distress. HEENT: Normocephalic and atraumatic. Sclera clear.  Neck: Supple. No carotid bruits. Difficult to assess JVD due to body habitus. Heart: RRR. Distinct S1 and S2. No significant murmurs, gallops, or rubs. Radial pulses 2+ and equal bilaterally. Lungs: No increased work of breathing. Clear to ausculation bilaterally. No wheezes, rhonchi, or rales.  Abdomen: Soft, non-distended, and non-tender to palpation.  Extremities: Tight woody edema of bilateral lower extremities.  Skin: Warm and dry. Neuro: Alert and oriented x3. No focal deficits. Psych: Normal affect. Responds appropriately.  Assessment:    1. Chest pain, unspecified type   2. Coronary artery disease involving native coronary artery of native heart with angina pectoris ([place])   3. Chronic diastolic CHF (congestive heart failure) ([place])   4. Primary hypertension   5. Hyperlipidemia, unspecified hyperlipidemia type   6. Type 2 diabetes mellitus  with complication, with long-term current use of insulin ([place])   7. Pulmonary embolism without acute cor pulmonale, unspecified chronicity, unspecified pulmonary embolism type ([place])   8. Obstructive sleep apnea   9. Morbid obesity ([place])     Plan:    Chest Pain CAD S/p DES to mid LAD in 2017. Last cath in 2019 showed patent stent and otherwise only mild disease. Patient has been seen in the  ED twice over the last 2 weeks for chest pain and shortness of breath. High-sensitivity troponin negative in the ED and EKG unremarkable. - Patient continues to report atypical chest pain.  - EKG today shows normal sinus rhythm, rate 84 bpm, with some underlying artifact and non-specific ST/T changes. No acute ischemic changes compared to prior tracing. - No Aspirin due to need for Eliquis. - Continue Coreg 12.5mg  twice daily, Imdur 30mg  daily, and Lipitor 40mg  daily. - Chest pain sounds mostly atypical but states he has been having it for months. Will order a 2 day Lexiscan Myoview. He currently has a URI and has a significant cough and nasal congestion. Will let him recover from this for about 1 week before doing Myoview.  Shared Decision Making/Informed Consent{ The risks [chest pain, shortness of breath, cardiac arrhythmias, dizziness, blood pressure fluctuations, myocardial infarction, stroke/transient ischemic attack, nausea, vomiting, allergic reaction, radiation exposure, metallic taste sensation and life-threatening complications (estimated to be 1 in 10,000)], benefits (risk stratification, diagnosing coronary artery disease, treatment guidance) and alternatives of a nuclear stress test were discussed in detail with Mr. Petropoulos and he agrees to proceed.  Chronic Diastolic CHF Last Echo in 06/2021 showed 55 -60% with normal wall motion and normal diastolic parameters as well as normal RV size and function and no significant valvular disease. He has been seen in the ED twice recently for shortness of breath and chest pain. BNP normal on 10/24/2021.  - Volume status difficult to assess due to body habitus but does not appears grossly volume overloaded. He does report worsening shortness of breath lately which I suspect is largely due to recent PE and current URI rather than CHF. - Continue Lasix 40mg  three times daily. - Continue Lisinopril 5mg  daily, Coreg 12.5mg  twice daily, and Imdur 30mg   daily. - Discussed importance of daily weight, strict I/Os, and renal function. Of note, patient was clear that he would not be reading the back of food labels in order to help watch his sodium intake.   Hypertension BP initially 184/88 but 132/76. Patient states BP is normally well controlled at home. - Continue medications for CHF as above. - Patient to keep BP/HR log for 2 weeks and then send this to [place].  Hyperlipidemia Lipid panel in 12/2020: Total Cholesterol 126, Triglycerides 418, HDL 29, LDL 37.  - Continue Lipitor 40mg  daily. - Continue Fenofibrate 145mg  daily. - He states he did not qualify for the CORE trial.  - Did not have time to discuss this today given acute issues above. Can discuss at follow-up visit.  Type 2 Diabetes Mellitus Uncontrolled. Hemoglobin A1c 9.4 in 07/2021.  - On Glipizide and Insulin.  - Management per Endocrinology.  Recurrent PE/DVT Patient recently diagnosed with PE in 06/2021. Recent chest CTA in 09/2021 showed no evidence of central PE with significantly reduced burden of pulmonary artery clot, essentially resolved, with questionable residual distal subsegmental clot within the right upper lobe, right middle lobe, and left lower lobe.  - On Eliquis 5mg  twice daily. Will defer duration of this to Hematology.  Chronic Anemia with History of Lower GI Bleed Admitted twice in 06/2021 with recurrent lower GI bleed after starting Eliquis for PE. EGD/Colonoscopy unremarkable at that time.  - Hemoglobin stable at 12.5 during recent ED visit on 10/24/2021.  - Patient does report continued blood in stools and is scheduled to see Hematology later this week.  Obstructive Sleep Apnea Not on CPAP.  - Would recommend CPAP therapy but did not have time to discuss today given acute issues above. Can discuss at follow-up visit.  Morbid Obesity BMI 48.53. - Suspect this is a large cause of his dyspnea.  Weight loss will be important. Will discuss possible referral to  [facility] [place] at follow-up visit.  Disposition: Follow up in 6-8 weeks after stress test.   Medication Adjustments/Labs and Tests Ordered: Current medicines are reviewed at length with the patient today.  Concerns regarding medicines are outlined above.  Orders Placed This Encounter  Procedures   Cardiac Stress Test: Informed Consent Details: Physician/Practitioner Attestation; Transcribe to consent form and obtain patient signature   MYOCARDIAL PERFUSION IMAGING   EKG 12-Lead   Meds ordered this encounter  Medications   furosemide (LASIX) 40 MG tablet    Sig: Take 1 tablet (40 mg total) by mouth 3 (three) times daily.    Order Specific Question:   Supervising Provider    Answer:   Skeet Latch [[id number]]    Patient Instructions  Medication Instructions:  Your physician recommends that you continue on your current medications as directed. Please refer to the Current Medication list given to you today.  *If you need a refill on your cardiac medications before your next appointment, please call your pharmacy*  Lab Work: NONE ordered at this time of appointment   If you have labs (blood work) drawn today and your tests are completely normal, you will receive your results only by: [facility] (if you have MyChart) OR A paper copy in the mail If you have any lab test that is abnormal or we need to change your treatment, we will call you to review the results.  Testing/Procedures: Your physician has requested that you have a lexiscan myoview. For further information please visit [email or web address]. Please follow instruction sheet, as given.  Please schedule for 1-2 weeks at [facility] office   Follow-Up: At [facility], you and your health needs are our priority.  As part of our continuing mission to provide you with exceptional heart care, we have created designated Provider Care Teams.  These Care Teams include your primary Cardiologist (physician)  and Advanced Practice Providers (APPs -  Physician Assistants and Nurse Practitioners) who all work together to provide you with the care you need, when you need it.  We recommend signing up for the patient portal called "MyChart".  Sign up information is provided on this After Visit Summary.  MyChart is used to connect with patients for Virtual Visits (Telemedicine).  Patients are able to view lab/test results, encounter notes, upcoming appointments, etc.  Non-urgent messages can be sent to your provider as well.   To learn more about what you can do with MyChart, go to [email or web address].    Your next appointment:   6-8 week(s)  The format for your next appointment:   In Person  Provider:   Kirk Ruths, MD  or Sande Rives, PA-C        Other Instructions Monitor blood pressure and heart rate at home. Bring log to your follow up appointment  Signed, Darreld Mclean, PA-C  10/27/2021 12:41 PM    [facility]

## 2021-10-26 NOTE — Telephone Encounter (Signed)
Spoke with patient regarding message He has been seen in ED twice since 12/24 Last week he did develop upper respiratory symptoms.   He did have chest xray and resp panel at ED 1/1 Since October he has been having increased shortness of breath and intermittent chest pains, worse with exertion  Patient stated feels like something sitting on his chest at times.  Scheduled appointment tomorrow with Elnita Maxwell PA

## 2021-10-27 ENCOUNTER — Other Ambulatory Visit: Payer: Self-pay

## 2021-10-27 ENCOUNTER — Encounter: Payer: Self-pay | Admitting: Student

## 2021-10-27 ENCOUNTER — Ambulatory Visit: Payer: Medicaid Other | Admitting: Student

## 2021-10-27 ENCOUNTER — Encounter: Payer: Self-pay | Admitting: *Deleted

## 2021-10-27 VITALS — BP 132/76 | HR 84 | Ht 70.0 in | Wt 338.2 lb

## 2021-10-27 DIAGNOSIS — G4733 Obstructive sleep apnea (adult) (pediatric): Secondary | ICD-10-CM

## 2021-10-27 DIAGNOSIS — E118 Type 2 diabetes mellitus with unspecified complications: Secondary | ICD-10-CM

## 2021-10-27 DIAGNOSIS — I5032 Chronic diastolic (congestive) heart failure: Secondary | ICD-10-CM

## 2021-10-27 DIAGNOSIS — I25119 Atherosclerotic heart disease of native coronary artery with unspecified angina pectoris: Secondary | ICD-10-CM

## 2021-10-27 DIAGNOSIS — R079 Chest pain, unspecified: Secondary | ICD-10-CM | POA: Diagnosis not present

## 2021-10-27 DIAGNOSIS — I2699 Other pulmonary embolism without acute cor pulmonale: Secondary | ICD-10-CM

## 2021-10-27 DIAGNOSIS — Z794 Long term (current) use of insulin: Secondary | ICD-10-CM

## 2021-10-27 DIAGNOSIS — E785 Hyperlipidemia, unspecified: Secondary | ICD-10-CM

## 2021-10-27 DIAGNOSIS — I10 Essential (primary) hypertension: Secondary | ICD-10-CM

## 2021-10-27 MED ORDER — FUROSEMIDE 40 MG PO TABS: 40.0000 mg | ORAL_TABLET | Freq: Three times a day (TID) | ORAL | Status: DC

## 2021-10-27 NOTE — Patient Instructions (Signed)
Medication Instructions:  Your physician recommends that you continue on your current medications as directed. Please refer to the Current Medication list given to you today.  *If you need a refill on your cardiac medications before your next appointment, please call your pharmacy*  Lab Work: NONE ordered at this time of appointment   If you have labs (blood work) drawn today and your tests are completely normal, you will receive your results only by: [place] (if you have MyChart) OR A paper copy in the mail If you have any lab test that is abnormal or we need to change your treatment, we will call you to review the results.  Testing/Procedures: Your physician has requested that you have a lexiscan myoview. For further information please visit [email or web address]. Please follow instruction sheet, as given.  Please schedule for 1-2 weeks at [facility] office   Follow-Up: At [facility], you and your health needs are our priority.  As part of our continuing mission to provide you with exceptional heart care, we have created designated Provider Care Teams.  These Care Teams include your primary Cardiologist (physician) and Advanced Practice Providers (APPs -  Physician Assistants and Nurse Practitioners) who all work together to provide you with the care you need, when you need it.  We recommend signing up for the patient portal called "MyChart".  Sign up information is provided on this After Visit Summary.  MyChart is used to connect with patients for Virtual Visits (Telemedicine).  Patients are able to view lab/test results, encounter notes, upcoming appointments, etc.  Non-urgent messages can be sent to your provider as well.   To learn more about what you can do with MyChart, go to [email or web address].    Your next appointment:   6-8 week(s)  The format for your next appointment:   In Person  Provider:   Kirk Ruths, MD  or Sande Rives, PA-C        Other  Instructions Monitor blood pressure and heart rate at home. Bring log to your follow up appointment

## 2021-10-29 ENCOUNTER — Inpatient Hospital Stay: Payer: Medicaid Other | Attending: Oncology | Admitting: Oncology

## 2021-10-29 ENCOUNTER — Other Ambulatory Visit: Payer: Self-pay

## 2021-10-29 VITALS — BP 152/72 | HR 92 | Temp 97.3°F | Resp 20 | Ht 70.0 in | Wt 341.6 lb

## 2021-10-29 DIAGNOSIS — E669 Obesity, unspecified: Secondary | ICD-10-CM | POA: Diagnosis not present

## 2021-10-29 DIAGNOSIS — Z86711 Personal history of pulmonary embolism: Secondary | ICD-10-CM | POA: Insufficient documentation

## 2021-10-29 DIAGNOSIS — R0602 Shortness of breath: Secondary | ICD-10-CM | POA: Insufficient documentation

## 2021-10-29 DIAGNOSIS — I272 Pulmonary hypertension, unspecified: Secondary | ICD-10-CM | POA: Insufficient documentation

## 2021-10-29 DIAGNOSIS — Z79899 Other long term (current) drug therapy: Secondary | ICD-10-CM | POA: Insufficient documentation

## 2021-10-29 DIAGNOSIS — I2699 Other pulmonary embolism without acute cor pulmonale: Secondary | ICD-10-CM | POA: Diagnosis present

## 2021-10-29 DIAGNOSIS — K921 Melena: Secondary | ICD-10-CM | POA: Diagnosis not present

## 2021-10-29 DIAGNOSIS — R059 Cough, unspecified: Secondary | ICD-10-CM | POA: Diagnosis not present

## 2021-10-29 DIAGNOSIS — D649 Anemia, unspecified: Secondary | ICD-10-CM | POA: Diagnosis not present

## 2021-10-29 DIAGNOSIS — Z7901 Long term (current) use of anticoagulants: Secondary | ICD-10-CM | POA: Diagnosis not present

## 2021-10-29 DIAGNOSIS — Z886 Allergy status to analgesic agent status: Secondary | ICD-10-CM | POA: Insufficient documentation

## 2021-10-29 DIAGNOSIS — R0600 Dyspnea, unspecified: Secondary | ICD-10-CM | POA: Diagnosis not present

## 2021-10-29 NOTE — Progress Notes (Signed)
Hematology and Oncology Follow Up Visit  [name] [id number] Mar 01, 1961 61 y.o. 10/29/2021 10:42 AM Abel Presto, Shawnee Knapp, FNP   Principle Diagnosis: 61 year old man with pulmonary embolism diagnosed in September 2022.  This appears to be provoked by COVID-19 infection as well as other comorbid conditions including immobility.     Current therapy: Full dose anticoagulation with Eliquis started in September 2022.  Interim History: Mr. Mclean returns today for a follow-up visit.  Since last visit, he was seen in the emergency department on few occasions with shortness of breath.  Repeat imaging studies of the chest in December 2021 showed resolution of his pulmonary emboli.  He continues to have occasional dyspnea and cough but no hemoptysis.  He has reported occasional hematochezia.     Medications: I have reviewed the patient's current medications.  Current Outpatient Medications  Medication Sig Dispense Refill   Accu-Chek FastClix Lancets MISC 1 Device by Other route 2 (two) times daily.      ACCU-CHEK GUIDE test strip 1 each by Other route 2 (two) times daily.     albuterol (VENTOLIN HFA) 108 (90 Base) MCG/ACT inhaler Inhale 2 puffs into the lungs every 6 (six) hours as needed for wheezing or shortness of breath.     apixaban (ELIQUIS) 5 MG TABS tablet Take 2 tablets (10mg ) twice daily for 7 days, then 1 tablet (5mg ) twice daily (Patient taking differently: Take 5 mg by mouth 2 (two) times daily.) 60 tablet 0   atorvastatin (LIPITOR) 40 MG tablet Take 40 mg by mouth at bedtime.     benzonatate (TESSALON) 100 MG capsule Take 1 capsule (100 mg total) by mouth every 8 (eight) hours. 21 capsule 0   carvedilol (COREG) 12.5 MG tablet Take 12.5 mg by mouth 2 (two) times daily.     diclofenac (FLECTOR) 1.3 % PTCH Place 1 patch onto the skin 2 (two) times daily as needed (back and knee pain).     fenofibrate (TRICOR) 145 MG tablet Take 1 tablet (145 mg total) by mouth  daily. (Patient taking differently: Take 145 mg by mouth every morning.) 90 tablet 2   furosemide (LASIX) 40 MG tablet Take 1 tablet (40 mg total) by mouth 3 (three) times daily.     gabapentin (NEURONTIN) 800 MG tablet Take 800 mg by mouth 3 (three) times daily.     glipiZIDE (GLUCOTROL) 10 MG tablet Take 10 mg by mouth 2 (two) times daily.  4   Guaifenesin 1200 MG TB12 Take 1 tablet (1,200 mg total) by mouth 2 (two) times daily at 10 AM and 5 PM for 7 days. 14 tablet 0   HUMULIN R U-500 KWIKPEN 500 UNIT/ML kwikpen Inject 80-95 Units into the skin in the morning, at noon, and at bedtime. Per sliding scale based on CBG     Insulin Pen Needle 31G X 5 MM MISC Use 1 needle daily to inject insulin as prescribed 100 each 2   ipratropium-albuterol (DUONEB) 0.5-2.5 (3) MG/3ML SOLN Take 3 mLs by nebulization 2 (two) times daily.     isosorbide mononitrate (IMDUR) 30 MG 24 hr tablet TAKE 1 TABLET BY MOUTH DAILY (Patient taking differently: Take 30 mg by mouth every morning.) 90 tablet 3   lisinopril (ZESTRIL) 5 MG tablet Take 5 mg by mouth every morning.     Menthol, Topical Analgesic, (BIOFREEZE EX) Apply 1 application topically 2 (two) times daily as needed (knee pain).     nitroGLYCERIN (NITROSTAT) 0.4 MG SL tablet  Place 1 tablet (0.4 mg total) under the tongue every 5 (five) minutes as needed for chest pain. 100 tablet 1   ondansetron (ZOFRAN) 4 MG tablet Take 4 mg by mouth 2 (two) times daily as needed for nausea/vomiting.     Oxycodone HCl 20 MG TABS Take 20 mg by mouth 3 (three) times daily as needed (pain).     pantoprazole (PROTONIX) 40 MG tablet Take 40 mg by mouth every morning.     potassium chloride (MICRO-K) 10 MEQ CR capsule Take 20 mEq by mouth 2 (two) times daily.     sertraline (ZOLOFT) 50 MG tablet Take 50 mg by mouth every morning.     tiZANidine (ZANAFLEX) 4 MG tablet Take 4 mg by mouth at bedtime as needed for muscle spasms.     traZODone (DESYREL) 50 MG tablet Take 1 tablet (50 mg  total) by mouth at bedtime. 30 tablet 11   Vitamin D, Ergocalciferol, (DRISDOL) 1.25 MG (50000 UT) CAPS capsule Take 50,000 Units by mouth every Monday.     No current facility-administered medications for this visit.     Allergies:  Allergies  Allergen Reactions   Coconut Flavor [Flavoring Agent] Hives   Coconut Oil Hives   Ibuprofen Other (See Comments)    Made gastric ulcers worse   Aleve [Naproxen] Other (See Comments)    DUE TO KIDNEYS   Nsaids Other (See Comments)    Stomach ulcers    Vascepa [Icosapent Ethyl] Other (See Comments)    headaches, chest pain, similar to sx of a stroke, hypotension       Physical Exam: Blood pressure (!) 152/72, pulse 92, temperature (!) 97.3 F (36.3 C), temperature source Temporal, resp. rate 20, height 5\' 10"  (1.778 m), weight (!) 341 lb 9.6 oz (154.9 kg), SpO2 96 %.  ECOG: 1  General appearance: alert and cooperative appeared without distress. Head: Normocephalic, without obvious abnormality Oropharynx: No oral thrush or ulcers. Eyes: No scleral icterus.  Pupils are equal and round reactive to light. Lymph nodes: Cervical, supraclavicular, and axillary nodes normal. Heart:regular rate and rhythm, S1, S2 normal, no murmur, click, rub or gallop Lung:chest clear, no wheezing, rales, normal symmetric air entry Abdomin: soft, non-tender, without masses or organomegaly. Neurological: No motor, sensory deficits.  Intact deep tendon reflexes. Skin: No rashes or lesions.  No ecchymosis or petechiae. Musculoskeletal: No joint deformity or effusion.     Lab Results: Lab Results  Component Value Date   WBC 6.5 10/24/2021   HGB 12.5 (L) 10/24/2021   HCT 38.3 (L) 10/24/2021   MCV 93.9 10/24/2021   PLT 220 10/24/2021     Chemistry      Component Value Date/Time   NA 138 10/24/2021 0940   NA 140 11/02/2020 1054   K 3.9 10/24/2021 0940   CL 101 10/24/2021 0940   CO2 24 10/24/2021 0940   BUN 23 (H) 10/24/2021 0940   BUN 13  11/02/2020 1054   CREATININE 1.14 10/24/2021 0940   CREATININE 0.74 03/08/2019 1008      Component Value Date/Time   CALCIUM 8.9 10/24/2021 0940   ALKPHOS 45 07/15/2021 1236   AST 25 07/15/2021 1236   ALT 26 07/15/2021 1236   BILITOT 0.8 07/15/2021 1236   BILITOT 0.5 10/17/2019 0836     IMPRESSION: No evidence of central pulmonary embolism. Significantly reduced burden of pulmonary artery clot, essentially resolved, with questionable residual distal subsegmental clot within the right upper lobe, right middle lobe, and left lower lobe,  which may be artifactual. Per medical record, the patient is currently on anticoagulation.   Resolved right upper lobe ground-glass opacities.   Unchanged mildly dilated main pulmonary, which can be seen in pulmonary hypertension.   Impression and Plan:  61 year old with:  1.  Pulmonary embolism provoked by COVID-19 infection, immobility and obesity diagnosed in September 2022.    He is currently on Eliquis without any major complications.  Risks and benefits of continuing this treatment were reviewed at this time.  He is not experiencing any major complications at this time and agreeable to continue.  I recommended extending his anticoagulation for 3 more months and potentially discontinue at that time unless other factors are discovered.     2.  Bleeding per rectum: Appears to be minor in nature.  His hemoglobin on January 1 was within normal range.     3.  Anemia: His hemoglobin continues to improve and approaching normal range.   4.  Follow-up: In 3 months for repeat follow-up.   30 minutes were spent on this encounter.  Time was dedicated to reviewing imaging studies, disease status update, treatment choices and future plan of care discussion.       Zola Button, MD 1/6/202310:42 AM

## 2021-11-02 ENCOUNTER — Ambulatory Visit: Payer: Medicaid Other | Admitting: Cardiology

## 2021-11-03 ENCOUNTER — Other Ambulatory Visit: Payer: Self-pay

## 2021-11-03 ENCOUNTER — Ambulatory Visit (INDEPENDENT_AMBULATORY_CARE_PROVIDER_SITE_OTHER): Payer: Medicaid Other | Admitting: Podiatry

## 2021-11-03 ENCOUNTER — Encounter: Payer: Self-pay | Admitting: Podiatry

## 2021-11-03 DIAGNOSIS — B351 Tinea unguium: Secondary | ICD-10-CM

## 2021-11-03 DIAGNOSIS — M79674 Pain in right toe(s): Secondary | ICD-10-CM

## 2021-11-03 DIAGNOSIS — N179 Acute kidney failure, unspecified: Secondary | ICD-10-CM

## 2021-11-03 DIAGNOSIS — E1142 Type 2 diabetes mellitus with diabetic polyneuropathy: Secondary | ICD-10-CM | POA: Diagnosis not present

## 2021-11-03 DIAGNOSIS — M79675 Pain in left toe(s): Secondary | ICD-10-CM

## 2021-11-03 NOTE — Progress Notes (Signed)
This patient presents  to my office for at risk foot care.  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  This patient presents for at risk foot care today.  General Appearance  Alert, conversant and in no acute stress.  Vascular  Dorsalis pedis   pulses are absent bilaterally. Posterior tibial pulses are absent due to swelling. Capillary return is within normal limits  bilaterally. Temperature is within normal limits  bilaterally.  Venous stasis  B/L.  Neurologic  Senn-Weinstein monofilament wire test within normal limits  bilaterally. Muscle power within normal limits bilaterally.  Nails Thick disfigured discolored nails with subungual debris  from hallux to fifth toes bilaterally. No evidence of bacterial infection or drainage bilaterally.  Orthopedic  No limitations of motion  feet .  No crepitus or effusions noted.  No bony pathology or digital deformities noted.  Skin  dry skin with no porokeratosis noted bilaterally.  No signs of infections or ulcers noted.     Onychomycosis  Pain in right toes  Pain in left toes  Consent was obtained for treatment procedures.   Mechanical debridement of nails 1-5  bilaterally performed with a nail nipper.  Filed with dremel without incident.    Return office visit    3 months                 Told patient to return for periodic foot care and evaluation due to potential at risk complications.   Gardiner Barefoot DPM

## 2021-11-10 ENCOUNTER — Telehealth (HOSPITAL_COMMUNITY): Payer: Self-pay

## 2021-11-10 NOTE — Telephone Encounter (Signed)
Spoke with the patient, detailed instructions given. He stated that he would be here for his test. Asked to call back with any questions. S.Gedalya Jim EMTP 

## 2021-11-16 ENCOUNTER — Other Ambulatory Visit: Payer: Self-pay

## 2021-11-16 ENCOUNTER — Ambulatory Visit (HOSPITAL_COMMUNITY): Payer: Medicaid Other | Attending: Cardiology

## 2021-11-16 DIAGNOSIS — R079 Chest pain, unspecified: Secondary | ICD-10-CM | POA: Diagnosis present

## 2021-11-16 MED ORDER — TECHNETIUM TC 99M TETROFOSMIN IV KIT
30.4000 | PACK | Freq: Once | INTRAVENOUS | Status: AC | PRN
  Administered 2021-11-16: 30.4 via INTRAVENOUS
  Filled 2021-11-16: qty 31

## 2021-11-16 MED ORDER — REGADENOSON 0.4 MG/5ML IV SOLN
0.4000 mg | Freq: Once | INTRAVENOUS | Status: AC
  Administered 2021-11-16: 0.4 mg via INTRAVENOUS

## 2021-11-17 ENCOUNTER — Ambulatory Visit (HOSPITAL_COMMUNITY): Payer: Medicaid Other | Attending: Cardiology

## 2021-11-17 ENCOUNTER — Ambulatory Visit: Payer: Medicaid Other | Admitting: Pulmonary Disease

## 2021-11-17 ENCOUNTER — Encounter: Payer: Self-pay | Admitting: Pulmonary Disease

## 2021-11-17 ENCOUNTER — Other Ambulatory Visit: Payer: Self-pay | Admitting: Internal Medicine

## 2021-11-17 ENCOUNTER — Ambulatory Visit (INDEPENDENT_AMBULATORY_CARE_PROVIDER_SITE_OTHER): Payer: Medicaid Other

## 2021-11-17 VITALS — BP 150/78 | HR 87 | Ht 70.0 in | Wt 348.6 lb

## 2021-11-17 DIAGNOSIS — R0600 Dyspnea, unspecified: Secondary | ICD-10-CM

## 2021-11-17 DIAGNOSIS — R0683 Snoring: Secondary | ICD-10-CM | POA: Diagnosis not present

## 2021-11-17 NOTE — Patient Instructions (Signed)
We will get a chest x-ray today and schedule pulmonary function test Order home sleep study for evaluation of sleep apnea  Follow-up in 2 to 3 months after these tests

## 2021-11-17 NOTE — Progress Notes (Signed)
[name]    [id number]    1961-06-11  Primary Care Physician:Hayes, Shawnee Knapp, FNP  Referring Physician: Drue Flirt, MD [phone],  [place] [address]  Chief complaint: Consult for dyspnea  HPI: 61 year old with history of COVID-19, PE, OSA, morbid obesity, coronary artery disease, DVT/PE complains of chronic dyspnea on exertion for the past 5 months.  He was hospitalized in September 2022 with COVID-19 infection.  Also found to have acute pulmonary embolism.  Started on heparin drip and transition to Eliquis.  Treated with remdesivir and steroids.  He had an admission shortly thereafter for acute GI bleed in setting of anticoagulation.  Underwent EGD and colonoscopy with no significant bleeding.  He continues on Eliquis Also has history of coronary artery disease and is undergoing stress test and due for follow-up with cardiology  He has history of OSA with sleep study done at [place].  He has not been on CPAP for many years  Pets: No pets Occupation: Retired Merchant navy officer Exposures: No mold, hot tub, Customer service manager.  No feather pillows or comforter Smoking history: Never smoker Travel history: No significant travel history Relevant family history: No family history of lung disease  Outpatient Encounter Medications as of 11/17/2021  Medication Sig   Accu-Chek FastClix Lancets MISC 1 Device by Other route 2 (two) times daily.    ACCU-CHEK GUIDE test strip 1 each by Other route 2 (two) times daily.   albuterol (VENTOLIN HFA) 108 (90 Base) MCG/ACT inhaler Inhale 2 puffs into the lungs every 6 (six) hours as needed for wheezing or shortness of breath.   apixaban (ELIQUIS) 5 MG TABS tablet Take 2 tablets (10mg ) twice daily for 7 days, then 1 tablet (5mg ) twice daily (Patient taking differently: Take 5 mg by mouth 2 (two) times daily.)   atorvastatin (LIPITOR) 40 MG tablet Take 40 mg by mouth at bedtime.   benzonatate (TESSALON) 100 MG  capsule Take 1 capsule (100 mg total) by mouth every 8 (eight) hours.   carvedilol (COREG) 12.5 MG tablet Take 12.5 mg by mouth 2 (two) times daily.   diclofenac (FLECTOR) 1.3 % PTCH Place 1 patch onto the skin 2 (two) times daily as needed (back and knee pain).   fenofibrate (TRICOR) 145 MG tablet Take 1 tablet (145 mg total) by mouth daily. (Patient taking differently: Take 145 mg by mouth every morning.)   furosemide (LASIX) 40 MG tablet Take 1 tablet (40 mg total) by mouth 3 (three) times daily.   gabapentin (NEURONTIN) 800 MG tablet Take 800 mg by mouth 3 (three) times daily.   glipiZIDE (GLUCOTROL) 10 MG tablet Take 10 mg by mouth 2 (two) times daily.   HUMULIN R U-500 KWIKPEN 500 UNIT/ML kwikpen Inject 80-95 Units into the skin in the morning, at noon, and at bedtime. Per sliding scale based on CBG   Insulin Pen Needle 31G X 5 MM MISC Use 1 needle daily to inject insulin as prescribed   ipratropium-albuterol (DUONEB) 0.5-2.5 (3) MG/3ML SOLN Take 3 mLs by nebulization 2 (two) times daily.   isosorbide mononitrate (IMDUR) 30 MG 24 hr tablet TAKE 1 TABLET BY MOUTH DAILY (Patient taking differently: Take 30 mg by mouth every morning.)   lisinopril (ZESTRIL) 5 MG tablet Take 5 mg by mouth every morning.   Menthol, Topical Analgesic, (BIOFREEZE EX) Apply 1 application topically 2 (two) times daily as needed (knee pain).   nitroGLYCERIN (NITROSTAT) 0.4 MG SL  tablet Place 1 tablet (0.4 mg total) under the tongue every 5 (five) minutes as needed for chest pain.   ondansetron (ZOFRAN) 4 MG tablet Take 4 mg by mouth 2 (two) times daily as needed for nausea/vomiting.   Oxycodone HCl 20 MG TABS Take 20 mg by mouth 3 (three) times daily as needed (pain).   pantoprazole (PROTONIX) 40 MG tablet Take 40 mg by mouth every morning.   potassium chloride (MICRO-K) 10 MEQ CR capsule Take 20 mEq by mouth 2 (two) times daily.   sertraline (ZOLOFT) 50 MG tablet Take 50 mg by mouth every morning.   tiZANidine  (ZANAFLEX) 4 MG tablet Take 4 mg by mouth at bedtime as needed for muscle spasms.   traZODone (DESYREL) 50 MG tablet Take 1 tablet (50 mg total) by mouth at bedtime.   Vitamin D, Ergocalciferol, (DRISDOL) 1.25 MG (50000 UT) CAPS capsule Take 50,000 Units by mouth every Monday.   No facility-administered encounter medications on file as of 11/17/2021.    Allergies as of 11/17/2021 - Review Complete 11/17/2021  Allergen Reaction Noted   Coconut flavor [flavoring agent] Hives 01/26/2017   Coconut oil Hives 03/09/2016   Ibuprofen Other (See Comments) 01/29/2014   Aleve [naproxen] Other (See Comments) 09/02/2017   Nsaids Other (See Comments) 12/27/2017   Vascepa [icosapent ethyl] Other (See Comments) 09/30/2019    Past Medical History:  Diagnosis Date   Anemia    Arthritis    Back pain    CAD (coronary artery disease)    a. s/p DES to LAD in 05/2016   Cervical radiculopathy    Chronic diastolic CHF (congestive heart failure) (HCC)    Chronic pain    Depression    DVT (deep venous thrombosis) (HCC)    Hematemesis    Hepatic steatosis    Hyperlipidemia    Hypertension    IBS (irritable bowel syndrome)    Morbid obesity (HCC)    OSA (obstructive sleep apnea)    Pancreatitis    PE (pulmonary thromboembolism) ([place])    Peripheral neuropathy    PUD (peptic ulcer disease)    Renal disorder    Stroke [facility])    a. ?details unclear - not seen on imaging when he was admitted in 05/2017 for TIA symptoms which were felt due to cervical radiculopathy.   Thoracic aortic ectasia (HCC)    a. 4.3cm ectatic ascending thoracic aorta by CT 06/2017.    Type 2 diabetes mellitus ([place])     Past Surgical History:  Procedure Laterality Date   CARDIAC CATHETERIZATION N/A 05/31/2016   Procedure: Left Heart Cath and Coronary Angiography;  Surgeon: Peter M Martinique, MD;  Location: [facility];  Service: Cardiovascular;  Laterality: N/A;   CARDIAC CATHETERIZATION N/A 05/31/2016   Procedure: Intravascular  Pressure Wire/FFR Study;  Surgeon: Peter M Martinique, MD;  Location: [place] [facility];  Service: Cardiovascular;  Laterality: N/A;   CARDIAC CATHETERIZATION N/A 05/31/2016   Procedure: Coronary Stent Intervention;  Surgeon: Peter M Martinique, MD;  Location: [place] [facility];  Service: Cardiovascular;  Laterality: N/A;   COLONOSCOPY N/A 07/17/2021   Procedure: COLONOSCOPY;  Surgeon: Wilford Corner, MD;  Location: [facility];  Service: Endoscopy;  Laterality: N/A;   COLONOSCOPY WITH PROPOFOL N/A 04/29/2020   Procedure: COLONOSCOPY WITH PROPOFOL;  Surgeon: Wilford Corner, MD;  Location: [facility];  Service: Endoscopy;  Laterality: N/A;   ESOPHAGOGASTRODUODENOSCOPY N/A 04/29/2020   Procedure: ESOPHAGOGASTRODUODENOSCOPY (EGD);  Surgeon: Wilford Corner, MD;  Location: [facility] [place];  Service: Endoscopy;  Laterality: N/A;   ESOPHAGOGASTRODUODENOSCOPY (EGD) WITH PROPOFOL N/A 07/17/2021   Procedure: ESOPHAGOGASTRODUODENOSCOPY (EGD) WITH PROPOFOL;  Surgeon: Wilford Corner, MD;  Location: [facility];  Service: Endoscopy;  Laterality: N/A;   LEFT HEART CATH AND CORONARY ANGIOGRAPHY N/A 12/08/2017   Procedure: LEFT HEART CATH AND CORONARY ANGIOGRAPHY;  Surgeon: Leonie Man, MD;  Location: [place] [facility];  Service: Cardiovascular;  Laterality: N/A;   LEFT HEART CATHETERIZATION WITH CORONARY ANGIOGRAM N/A 02/03/2014   Procedure: LEFT HEART CATHETERIZATION WITH CORONARY ANGIOGRAM;  Surgeon: Pixie Casino, MD;  Location[facility] C[facility] LAB;  Service: Cardiovascular;  Laterality: N/A;   left leg stent      POLYPECTOMY  04/29/2020   Procedure: POLYPECTOMY;  Surgeon: Wilford Corner, MD;  Location: [facility] ENDOSCOPY;  Service: Endoscopy;;   POLYPECTOMY  07/17/2021   Procedure: POLYPECTOMY;  Surgeon: Wilford Corner, MD;  Location: [facility];  Service: Endoscopy;;    Family History  Problem Relation Age of Onset   Cancer Father    Hypertension Mother    Diabetes Mother    Breast cancer Mother     Hypertension Brother    Diabetes Brother    Hypertension Sister    Diabetes Sister     Social History   Socioeconomic History   Marital status: Single    Spouse name: Not on file   Number of children: 3   Years of education: 12   Highest education level: Not on file  Occupational History   Occupation: Disabled  Tobacco Use   Smoking status: Never   Smokeless tobacco: Never  Vaping Use   Vaping Use: Never used  Substance and Sexual Activity   Alcohol use: No   Drug use: No   Sexual activity: Not on file  Other Topics Concern   Not on file  Social History Narrative   Independent and ambulatory with cane.   Lives at home alone.   Right-handed.   1-2 cups caffeine per day.   Social Determinants of Health   Financial Resource Strain: Not on file  Food Insecurity: Not on file  Transportation Needs: Not on file  Physical Activity: Not on file  Stress: Not on file  Social Connections: Not on file  Intimate Partner Violence: Not on file    Review of systems: Review of Systems  Constitutional: Negative for fever and chills.  HENT: Negative.   Eyes: Negative for blurred vision.  Respiratory: as per HPI  Cardiovascular: Negative for chest pain and palpitations.  Gastrointestinal: Negative for vomiting, diarrhea, blood per rectum. Genitourinary: Negative for dysuria, urgency, frequency and hematuria.  Musculoskeletal: Negative for myalgias, back pain and joint pain.  Skin: Negative for itching and rash.  Neurological: Negative for dizziness, tremors, focal weakness, seizures and loss of consciousness.  Endo/Heme/Allergies: Negative for environmental allergies.  Psychiatric/Behavioral: Negative for depression, suicidal ideas and hallucinations.  All other systems reviewed and are negative.  Physical Exam: Blood pressure (!) 150/78, pulse 87, height 5\' 10"  (1.778 m), weight (!) 348 lb 9.6 oz (158.1 kg), SpO2 96 %. Gen:      No acute distress HEENT:  EOMI, sclera  anicteric Neck:     No masses; no thyromegaly Lungs:    Clear to auscultation bilaterally; normal respiratory effort CV:         Regular rate and rhythm; no murmurs Abd:      + bowel sounds; soft, non-tender; no palpable masses, no distension Ext:    No edema; adequate peripheral perfusion Skin:  Warm and dry; no rash Neuro: alert and oriented x 3 Psych: normal mood and affect  Data Reviewed: Imaging: CT 06/25/2021-pulm embolism with moderate clot burden, RV/LV ratio 1.08, patchy groundglass opacities in the right upper lobe, chronic dilatation of pulmonary artery  CTA 10/08/2021-significantly reduced clot burden which is essentially resolved with minimal residual clot. I have reviewed the images personally.  PFTs:  Labs:  Cardiac: Echocardiogram 06/28/2021 LVEF 60 to 65%, moderate LVH, normal RV systolic size and function  Assessment:  Chronic dyspnea History of pulmonary embolism Coronary artery disease, HfPEF He is on anticoagulation for pulmonary embolism.  Per oncology note recommendation is for 6 months but as per patient he had another PE/DVT in 2018 though I cannot find records of this.  No pulmonary hypertension on recent echocardiogram Suspect dyspnea is multifactorial from obesity, deconditioning, heart failure  We will get pulmonary function test for evaluation and chest x-ray today  Sleep apnea Untreated.  Not currently on CPAP Order home sleep study for reassessment  Plan/Recommendations: Chest x-ray, PFTs Home sleep study  Marshell Garfinkel MD [facility] 11/17/2021, 3:31 PM  CC: Drue Flirt, MD

## 2021-11-22 ENCOUNTER — Other Ambulatory Visit: Payer: Self-pay

## 2021-11-22 ENCOUNTER — Ambulatory Visit (HOSPITAL_COMMUNITY): Payer: Medicaid Other | Attending: Cardiology

## 2021-11-22 LAB — MYOCARDIAL PERFUSION IMAGING
LV dias vol: 138 mL (ref 62–150)
LV sys vol: 68 mL
Nuc Stress EF: 51 %
Peak HR: 95 {beats}/min
Rest HR: 62 {beats}/min
Rest Nuclear Isotope Dose: 31.5 mCi
SDS: 0
SRS: 3
SSS: 0
ST Depression (mm): 0 mm
Stress Nuclear Isotope Dose: 30.4 mCi
TID: 1.08

## 2021-11-22 MED ORDER — TECHNETIUM TC 99M TETROFOSMIN IV KIT
31.5000 | PACK | Freq: Once | INTRAVENOUS | Status: AC | PRN
  Administered 2021-11-22: 31.5 via INTRAVENOUS
  Filled 2021-11-22: qty 32

## 2021-12-02 ENCOUNTER — Telehealth: Payer: Self-pay | Admitting: *Deleted

## 2021-12-02 NOTE — Telephone Encounter (Signed)
Juan Riley, NP states [name] will be having a dental cleaning, x rays and possible filling or crown according to dentist note in chart. Pt does not think he will be having anything other than a cleaning. Ramona will try to get more information and let us know. This was regarding Eliquis instructions for appt in April.

## 2021-12-02 NOTE — Telephone Encounter (Signed)
[facility] called. [name] is having a dental procedure in April. Amedeo Plenty, NP wants to know instructions for Eliquis before and after procedure.

## 2021-12-02 NOTE — Telephone Encounter (Signed)
LM for office to call us back regarding what type of dental procedure patient is going to have.

## 2021-12-03 ENCOUNTER — Telehealth (HOSPITAL_COMMUNITY): Payer: Self-pay

## 2021-12-03 NOTE — Telephone Encounter (Signed)
Pt insurance is active and benefits verified through [employer] $3, DED 0/0 met, out of pocket 0/0 met, co-insurance 0%. no pre-authorization required. [place]/[employer] 12/03/2021_0 :10pm, REF# [id number] NONE OF THE G-CODES ARE COVERED [id number], [id number], [id number]. PULMONARY REHAB IS COVERED UNDER [facility].

## 2021-12-06 NOTE — Telephone Encounter (Signed)
Called pt and advised him that his insurance will only cover pulmonary rehab for COPD diagnosis. I advised pt that we have pulmonary rehab maintenance program that's $51 a month and that he would come in twice a week for exercise. Pt stated that he is on a fixed income and that he couldn't afford that right now. Pt stated that he has a follow up with his pulmonologist on 02/01/2022 and that he is not sure if he has COPD or not. I advised pt that I will keep his referral active until he goes to his pulmonary appt to see if he indeed has COPD.

## 2021-12-16 NOTE — Progress Notes (Signed)
HPI:FU CAD and diastolic CHF. Pt had DES to LAD 8/17. CTA 9/17 showed no pulmonary embolus. Cardiac catheterization February 2019 showed patent stents and normal LV function. Left ventricular end-diastolic pressure elevated at 27 mmHg. Carotid Dopplers April 2019 showed 1 to 39% right and near normal left carotid. CTA August 2019 showed no thoracic aortic aneurysm.  Patient seen with syncope December 2021. BUN 65 and creatinine 3.06 which was new.  Hemoglobin 12. Episode was felt to be orthostatic mediated.  He was hydrated with improvement in his renal function.  His Lasix was decreased from 40 3 times daily to 40 mg once daily.  Patient had pulmonary embolus September 2022.  This was felt secondary to COVID-19 infection, immobility and obesity.  Venous Doppler September 2022 showed no DVT.  Echocardiogram September 2022 showed normal LV function, moderate left ventricular hypertrophy.  Follow-up CTA December 2022 showed resolution of pulmonary embolus.  Nuclear study January 2023 showed ejection fraction 51% with no ischemia or infarction.  Pulmonary embolus is being followed by hematology.  Since last seen patient complains of dyspnea on exertion.  He also has lower extremity edema by report.  He continues to have intermittent chest pain.  The pain is in various locations on his chest.  Current Outpatient Medications  Medication Sig Dispense Refill   Accu-Chek FastClix Lancets MISC 1 Device by Other route 2 (two) times daily.      ACCU-CHEK GUIDE test strip 1 each by Other route 2 (two) times daily.     albuterol (VENTOLIN HFA) 108 (90 Base) MCG/ACT inhaler Inhale 2 puffs into the lungs every 6 (six) hours as needed for wheezing or shortness of breath.     apixaban (ELIQUIS) 5 MG TABS tablet Take 2 tablets (10mg ) twice daily for 7 days, then 1 tablet (5mg ) twice daily (Patient taking differently: Take 5 mg by mouth 2 (two) times daily.) 60 tablet 0   atorvastatin (LIPITOR) 40 MG tablet Take 40  mg by mouth at bedtime.     carvedilol (COREG) 12.5 MG tablet Take 12.5 mg by mouth 2 (two) times daily.     diclofenac (FLECTOR) 1.3 % PTCH Place 1 patch onto the skin 2 (two) times daily as needed (back and knee pain).     fenofibrate (TRICOR) 145 MG tablet Take 1 tablet (145 mg total) by mouth daily. (Patient taking differently: Take 145 mg by mouth every morning.) 90 tablet 2   furosemide (LASIX) 40 MG tablet Take 1 tablet (40 mg total) by mouth 3 (three) times daily.     gabapentin (NEURONTIN) 800 MG tablet Take 800 mg by mouth 3 (three) times daily.     glipiZIDE (GLUCOTROL) 10 MG tablet Take 10 mg by mouth 2 (two) times daily.  4   HUMULIN R U-500 KWIKPEN 500 UNIT/ML kwikpen Inject 80-95 Units into the skin in the morning, at noon, and at bedtime. Per sliding scale based on CBG     Insulin Pen Needle 31G X 5 MM MISC Use 1 needle daily to inject insulin as prescribed 100 each 2   ipratropium-albuterol (DUONEB) 0.5-2.5 (3) MG/3ML SOLN Take 3 mLs by nebulization 2 (two) times daily.     isosorbide mononitrate (IMDUR) 30 MG 24 hr tablet TAKE 1 TABLET BY MOUTH DAILY 28 tablet 1   lisinopril (ZESTRIL) 5 MG tablet Take 5 mg by mouth every morning.     Menthol, Topical Analgesic, (BIOFREEZE EX) Apply 1 application topically 2 (two) times daily as  needed (knee pain).     nitroGLYCERIN (NITROSTAT) 0.4 MG SL tablet Place 1 tablet (0.4 mg total) under the tongue every 5 (five) minutes as needed for chest pain. 100 tablet 1   ondansetron (ZOFRAN) 4 MG tablet Take 4 mg by mouth 2 (two) times daily as needed for nausea/vomiting.     Oxycodone HCl 20 MG TABS Take 20 mg by mouth 3 (three) times daily as needed (pain).     pantoprazole (PROTONIX) 40 MG tablet Take 40 mg by mouth every morning.     potassium chloride (MICRO-K) 10 MEQ CR capsule Take 20 mEq by mouth 2 (two) times daily.     sertraline (ZOLOFT) 50 MG tablet Take 50 mg by mouth every morning.     tiZANidine (ZANAFLEX) 4 MG tablet Take 4 mg by  mouth at bedtime as needed for muscle spasms.     traZODone (DESYREL) 50 MG tablet Take 1 tablet (50 mg total) by mouth at bedtime. 30 tablet 11   Vitamin D, Ergocalciferol, (DRISDOL) 1.25 MG (50000 UT) CAPS capsule Take 50,000 Units by mouth every Monday.     No current facility-administered medications for this visit.     Past Medical History:  Diagnosis Date   Anemia    Arthritis    Back pain    CAD (coronary artery disease)    a. s/p DES to LAD in 05/2016   Cervical radiculopathy    Chronic diastolic CHF (congestive heart failure) (HCC)    Chronic pain    Depression    DVT (deep venous thrombosis) (HCC)    Hematemesis    Hepatic steatosis    Hyperlipidemia    Hypertension    IBS (irritable bowel syndrome)    Morbid obesity (HCC)    OSA (obstructive sleep apnea)    Pancreatitis    PE (pulmonary thromboembolism) ([place])    Peripheral neuropathy    PUD (peptic ulcer disease)    Renal disorder    Stroke [facility])    a. ?details unclear - not seen on imaging when he was admitted in 05/2017 for TIA symptoms which were felt due to cervical radiculopathy.   Thoracic aortic ectasia (HCC)    a. 4.3cm ectatic ascending thoracic aorta by CT 06/2017.    Type 2 diabetes mellitus ([place])     Past Surgical History:  Procedure Laterality Date   CARDIAC CATHETERIZATION N/A 05/31/2016   Procedure: Left Heart Cath and Coronary Angiography;  Surgeon: Peter M Martinique, MD;  Location: [place] [facility];  Service: Cardiovascular;  Laterality: N/A;   CARDIAC CATHETERIZATION N/A 05/31/2016   Procedure: Intravascular Pressure Wire/FFR Study;  Surgeon: Peter M Martinique, MD;  Location: [place] [facility];  Service: Cardiovascular;  Laterality: N/A;   CARDIAC CATHETERIZATION N/A 05/31/2016   Procedure: Coronary Stent Intervention;  Surgeon: Peter M Martinique, MD;  Location: [place] [facility];  Service: Cardiovascular;  Laterality: N/A;   COLONOSCOPY N/A 07/17/2021   Procedure: COLONOSCOPY;  Surgeon: Wilford Corner, MD;  Location: [facility];  Service: Endoscopy;  Laterality: N/A;   COLONOSCOPY WITH PROPOFOL N/A 04/29/2020   Procedure: COLONOSCOPY WITH PROPOFOL;  Surgeon: Wilford Corner, MD;  Location: [place];  Service: Endoscopy;  Laterality: N/A;   ESOPHAGOGASTRODUODENOSCOPY N/A 04/29/2020   Procedure: ESOPHAGOGASTRODUODENOSCOPY (EGD);  Surgeon: Wilford Corner, MD;  Location: [place];  Service: Endoscopy;  Laterality: N/A;   ESOPHAGOGASTRODUODENOSCOPY (EGD) WITH PROPOFOL N/A 07/17/2021   Procedure: ESOPHAGOGASTRODUODENOSCOPY (EGD) WITH PROPOFOL;  Surgeon: Wilford Corner, MD;  Location: [facility];  Service: Endoscopy;  Laterality: N/A;   LEFT HEART CATH AND CORONARY ANGIOGRAPHY N/A 12/08/2017   Procedure: LEFT HEART CATH AND CORONARY ANGIOGRAPHY;  Surgeon: Leonie Man, MD;  Location: [place] [facility];  Service: Cardiovascular;  Laterality: N/A;   LEFT HEART CATHETERIZATION WITH CORONARY ANGIOGRAM N/A 02/03/2014   Procedure: LEFT HEART CATHETERIZATION WITH CORONARY ANGIOGRAM;  Surgeon: Pixie Casino, MD;  Location: [facility] CATH LAB;  Service: Cardiovascular;  Laterality: N/A;   left leg stent      POLYPECTOMY  04/29/2020   Procedure: POLYPECTOMY;  Surgeon: Wilford Corner, MD;  Location: [facility] ENDOSCOPY;  Service: Endoscopy;;   POLYPECTOMY  07/17/2021   Procedure: POLYPECTOMY;  Surgeon: Wilford Corner, MD;  Location: [facility] ENDOSCOPY;  Service: Endoscopy;;    Social History   Socioeconomic History   Marital status: Single    Spouse name: Not on file   Number of children: 3   Years of education: 12   Highest education level: Not on file  Occupational History   Occupation: Disabled  Tobacco Use   Smoking status: Never   Smokeless tobacco: Never  Vaping Use   Vaping Use: Never used  Substance and Sexual Activity   Alcohol use: No   Drug use: No   Sexual activity: Not on file  Other Topics Concern   Not on file  Social History Narrative   Independent and ambulatory  with cane.   Lives at home alone.   Right-handed.   1-2 cups caffeine per day.   Social Determinants of Health   Financial Resource Strain: Not on file  Food Insecurity: Not on file  Transportation Needs: Not on file  Physical Activity: Not on file  Stress: Not on file  Social Connections: Not on file  Intimate Partner Violence: Not on file    Family History  Problem Relation Age of Onset   Cancer Father    Hypertension Mother    Diabetes Mother    Breast cancer Mother    Hypertension Brother    Diabetes Brother    Hypertension Sister    Diabetes Sister     ROS: no fevers or chills, productive cough, hemoptysis, dysphasia, odynophagia, melena, hematochezia, dysuria, hematuria, rash, seizure activity, orthopnea, PND, pedal edema, claudication. Remaining systems are negative.  Physical Exam: Well-developed morbidly obese in no acute distress.  Skin is warm and dry.  HEENT is normal.  Neck is supple.  Chest is clear to auscultation with normal expansion.  Cardiovascular exam is regular rate and rhythm.  Abdominal exam nontender or distended. No masses palpated. Extremities show trace to 1+ edema. neuro grossly intact  ECG-normal sinus rhythm at a rate of 89, nonspecific lateral T wave changes.  No significant change compared to October 27, 2021.  Personally reviewed  A/P  1 coronary artery disease-plan to continue medical therapy with Lipitor.  He is not on aspirin given need for apixaban.  2 history of chronic chest pain-most recent nuclear study showed no ischemia.  Electrocardiogram with no new ST changes.  Will not pursue further ischemia evaluation at this point.  3 hypertension-patient's blood pressure is elevated.  Increase lisinopril to 20 mg daily.  In 1 week check potassium and renal function.  4 chronic diastolic congestive heart failure-volume status difficult to assess given obesity.  We will continue Lasix at present dose.  Check BNP, potassium and renal  function.  5 dyspnea-he continues to complain of dyspnea.  This is felt to be multifactorial including history of pulmonary  embolus, chronic diastolic congestive heart failure, obesity hypoventilation syndrome and possible contribution from obstructive sleep apnea.  We will check BNP.  May need to increase diuretics but note he developed acute renal insufficiency with higher doses in the past.  6 carotid artery disease-mild on most recent Dopplers.  7 morbid obesity-we discussed the importance of weight loss.  8 history of pulmonary embolus-being seen by both pulmonary and hematology.  Continue apixaban.  Kirk Ruths, MD

## 2021-12-21 ENCOUNTER — Ambulatory Visit: Payer: Medicaid Other | Admitting: Cardiology

## 2021-12-21 ENCOUNTER — Encounter: Payer: Self-pay | Admitting: Cardiology

## 2021-12-21 ENCOUNTER — Other Ambulatory Visit: Payer: Self-pay

## 2021-12-21 ENCOUNTER — Encounter: Payer: Self-pay | Admitting: *Deleted

## 2021-12-21 VITALS — BP 150/76 | HR 78 | Ht 70.0 in | Wt 348.0 lb

## 2021-12-21 DIAGNOSIS — R079 Chest pain, unspecified: Secondary | ICD-10-CM | POA: Diagnosis not present

## 2021-12-21 DIAGNOSIS — I10 Essential (primary) hypertension: Secondary | ICD-10-CM | POA: Diagnosis not present

## 2021-12-21 DIAGNOSIS — E785 Hyperlipidemia, unspecified: Secondary | ICD-10-CM

## 2021-12-21 DIAGNOSIS — Z006 Encounter for examination for normal comparison and control in clinical research program: Secondary | ICD-10-CM

## 2021-12-21 DIAGNOSIS — I25119 Atherosclerotic heart disease of native coronary artery with unspecified angina pectoris: Secondary | ICD-10-CM | POA: Diagnosis not present

## 2021-12-21 DIAGNOSIS — I5032 Chronic diastolic (congestive) heart failure: Secondary | ICD-10-CM | POA: Diagnosis not present

## 2021-12-21 MED ORDER — LISINOPRIL 20 MG PO TABS: 20.0000 mg | ORAL_TABLET | Freq: Every morning | ORAL | 3 refills | Status: DC

## 2021-12-21 NOTE — Patient Instructions (Signed)
Medication Instructions:   INCREASE LISINOPRIL TO 20 MG ONCE DAILY= 4 OF THE 5 MG TABLETS ONCE DAILY  *If you need a refill on your cardiac medications before your next appointment, please call your pharmacy*   Lab Work:  Your physician recommends that you return for lab work in: [place]  If you have labs (blood work) drawn today and your tests are completely normal, you will receive your results only by: [employer] (if you have MyChart) OR A paper copy in the mail If you have any lab test that is abnormal or we need to change your treatment, we will call you to review the results.   Follow-Up: At [facility], you and your health needs are our priority.  As part of our continuing mission to provide you with exceptional heart care, we have created designated Provider Care Teams.  These Care Teams include your primary Cardiologist (physician) and Advanced Practice Providers (APPs -  Physician Assistants and Nurse Practitioners) who all work together to provide you with the care you need, when you need it.  We recommend signing up for the patient portal called "MyChart".  Sign up information is provided on this After Visit Summary.  MyChart is used to connect with patients for Virtual Visits (Telemedicine).  Patients are able to view lab/test results, encounter notes, upcoming appointments, etc.  Non-urgent messages can be sent to your provider as well.   To learn more about what you can do with MyChart, go to [email or web address].    Your next appointment:   8 week(s)  The format for your next appointment:   In Person  Provider:   Coletta Memos, FNP, Sande Rives, PA-C, or Almyra Deforest, PA-C    Then, Kirk Ruths, MD will plan to see you again in 6 month(s).

## 2021-12-21 NOTE — Research (Addendum)
Received these labs 12-21-21 for Dr Debara Pickett to review.   Labs not clinically significant.  Pixie Casino, MD, [facility] [place] Director of the Advanced Lipid Disorders &  Cardiovascular Risk Reduction Clinic Diplomate of the American Board of Clinical Lipidology Attending Cardiologist  Direct Dial: [phone]   Fax: [phone]  Website:  [email or web address]

## 2021-12-27 ENCOUNTER — Encounter: Payer: Self-pay | Admitting: Internal Medicine

## 2021-12-27 ENCOUNTER — Ambulatory Visit: Payer: Medicaid Other | Admitting: Internal Medicine

## 2021-12-27 ENCOUNTER — Other Ambulatory Visit: Payer: Self-pay

## 2021-12-27 VITALS — BP 140/68 | HR 66 | Ht 70.0 in | Wt 345.0 lb

## 2021-12-27 DIAGNOSIS — E118 Type 2 diabetes mellitus with unspecified complications: Secondary | ICD-10-CM | POA: Diagnosis not present

## 2021-12-27 DIAGNOSIS — E781 Pure hyperglyceridemia: Secondary | ICD-10-CM | POA: Diagnosis not present

## 2021-12-27 DIAGNOSIS — E785 Hyperlipidemia, unspecified: Secondary | ICD-10-CM | POA: Diagnosis not present

## 2021-12-27 DIAGNOSIS — Z794 Long term (current) use of insulin: Secondary | ICD-10-CM

## 2021-12-27 LAB — LIPID PANEL
Chol/HDL Ratio: 3.5 ratio (ref 0.0–5.0)
Cholesterol, Total: 97 mg/dL — ABNORMAL LOW (ref 100–199)
HDL: 28 mg/dL — ABNORMAL LOW (ref >39)
LDL Chol Calc (NIH): 31 mg/dL (ref 0–99)
Triglycerides: 248 mg/dL — ABNORMAL HIGH (ref 0–149)
VLDL Cholesterol Cal: 38 mg/dL (ref 5–40)

## 2021-12-27 LAB — LDL CHOLESTEROL, DIRECT: LDL Direct: 40 mg/dL (ref 0–99)

## 2021-12-27 NOTE — Progress Notes (Signed)
LIPID OFFICE NOTE  Chief Complaint:  Follow-up lipids  Primary Care Physician: [name], FNP  Primary Cardiologist:  Kirk Ruths, MD  HPI:  [name] is a 61 y.o. male who is being seen today for the evaluation of hypertriglyceridemia at the request of [name], FNP. [name] is a pleasant 61 year old male patient who I previously took care of back in 2015.  In fact he underwent cardiac catheterization by myself which showed just moderate LAD disease at the time.  He had been managed medically for this.  Other comorbidities include morbid obesity, insulin-dependent diabetes with recently worse blood sugar controlled hemoglobin A1c 9.8.  He has dyslipidemia with persistently elevated triglycerides which has ranged between 202 and 250 on treatment with atorvastatin 80 mg, however recently a repeat lipid profile showed markdly elevated triglycerides near 600.  Over the past year he is suffered from chronic pain and is received steroids, he also has a history of pancreatitis in the past.  Is not clear whether this was related to elevated triglycerides or not.  He ultimately had progression of coronary disease and received a stent to the LAD in August 2017 and transition care over to Dr. Stanford Breed.  He is currently referred today for evaluation and management of elevated triglycerides.   08/24/2020  [name] is seen today in follow-up.  Over the summer he was hospitalized for DKA/hyperglycemia.  He was hypotensive and he was concerned about possible anaphylaxis as he had just started Vascepa.  He also mentioned that he thought that it increased his blood sugar however there is little evidence of this.  According to the hospitalist physician Dr. Broadus John, the medication was held pending follow-up with me.  Subsequently had been admitted several times with hyperglycemia, infection and other issues including chest pain.  Hemoglobin A1c is 0 poorly controlled at 11.6 despite  being on insulin at this point.  Triglycerides again were rechecked at 529.  02/12/2020  [name] returns for follow-up.  He has had some improvement in his triglycerides.  Recent lipids show total cholesterol 113, triglycerides 325, HDL 33 and LDL of 32.  Triglycerides are down from 529 and the LDL is further decreased from 51.  He reports some improvement in his glycemic control.  06/18/2020  [name] is seen today in follow-up.  He was doing well with his triglycerides however recently was admitted had some GI bleeding issues.  Hemoglobin A1c has been trending up all the way to 12.4.  He said he had some issues with his insulin is not working together and his endocrinologist switched him out.  Since then apparently his numbers are better but his labs yesterday showed very poorly controlled triglycerides.  Total cholesterol was 210, triglycerides 1849 and HDL 24.  LDL cannot be calculated due to the significant lipemia.   12/18/2020  [name] is seen today in follow-up.  Unfortunately he has not had reassessment of his triglycerides.  His last lab work however showed significantly elevated triglycerides 1849 in August 2021.  He has been on atorvastatin and fenofibrate, but unfortunately cannot tolerate Vascepa due to headaches chest pain and symptoms similar to his prior stroke.  We discussed several other options for him and I would like him to be evaluated for the core trial.  I do suspect a lot of his triglycerides are related to persistent hyperglycemia however his A1c has come down about 2% but remains above 10% as of December 23.  12/27/2021  [name] returns today for follow-up.  I had referred him to the CORE triglyceride trial, however recent lipids show that his triglycerides were not quite high enough for the trial.  His triglycerides in fact were 499 in February 2023, just slightly below the 500 or higher cutoff for the trial therefore he did not qualify.  He is on max therapy  with statin and fenofibrate.  He has been able to lower his A1c which is a big factor.  PMHx:  Past Medical History:  Diagnosis Date   Anemia    Arthritis    Back pain    CAD (coronary artery disease)    a. s/p DES to LAD in 05/2016   Cervical radiculopathy    Chronic diastolic CHF (congestive heart failure) (HCC)    Chronic pain    Depression    DVT (deep venous thrombosis) (HCC)    Hematemesis    Hepatic steatosis    Hyperlipidemia    Hypertension    IBS (irritable bowel syndrome)    Morbid obesity (HCC)    OSA (obstructive sleep apnea)    Pancreatitis    PE (pulmonary thromboembolism) ([place])    Peripheral neuropathy    PUD (peptic ulcer disease)    Renal disorder    Stroke [facility])    a. ?details unclear - not seen on imaging when he was admitted in 05/2017 for TIA symptoms which were felt due to cervical radiculopathy.   Thoracic aortic ectasia (HCC)    a. 4.3cm ectatic ascending thoracic aorta by CT 06/2017.    Type 2 diabetes mellitus ([place])     Past Surgical History:  Procedure Laterality Date   CARDIAC CATHETERIZATION N/A 05/31/2016   Procedure: Left Heart Cath and Coronary Angiography;  Surgeon: Peter M Martinique, MD;  Location: [place] CV LAB;  Service: Cardiovascular;  Laterality: N/A;   CARDIAC CATHETERIZATION N/A 05/31/2016   Procedure: Intravascular Pressure Wire/FFR Study;  Surgeon: Peter M Martinique, MD;  Location: [place] CV LAB;  Service: Cardiovascular;  Laterality: N/A;   CARDIAC CATHETERIZATION N/A 05/31/2016   Procedure: Coronary Stent Intervention;  Surgeon: Peter M Martinique, MD;  Location: [place] CV LAB;  Service: Cardiovascular;  Laterality: N/A;   COLONOSCOPY N/A 07/17/2021   Procedure: COLONOSCOPY;  Surgeon: Wilford Corner, MD;  Location: [facility];  Service: Endoscopy;  Laterality: N/A;   COLONOSCOPY WITH PROPOFOL N/A 04/29/2020   Procedure: COLONOSCOPY WITH PROPOFOL;  Surgeon: Wilford Corner, MD;  Location: [place];  Service: Endoscopy;   Laterality: N/A;   ESOPHAGOGASTRODUODENOSCOPY N/A 04/29/2020   Procedure: ESOPHAGOGASTRODUODENOSCOPY (EGD);  Surgeon: Wilford Corner, MD;  Location: [place];  Service: Endoscopy;  Laterality: N/A;   ESOPHAGOGASTRODUODENOSCOPY (EGD) WITH PROPOFOL N/A 07/17/2021   Procedure: ESOPHAGOGASTRODUODENOSCOPY (EGD) WITH PROPOFOL;  Surgeon: Wilford Corner, MD;  Location: [facility];  Service: Endoscopy;  Laterality: N/A;   LEFT HEART CATH AND CORONARY ANGIOGRAPHY N/A 12/08/2017   Procedure: LEFT HEART CATH AND CORONARY ANGIOGRAPHY;  Surgeon: Leonie Man, MD;  Location: [place] CV LAB;  Service: Cardiovascular;  Laterality: N/A;   LEFT HEART CATHETERIZATION WITH CORONARY ANGIOGRAM N/A 02/03/2014   Procedure: LEFT HEART CATHETERIZATION WITH CORONARY ANGIOGRAM;  Surgeon: Pixie Casino, MD;  Location: [facility] CATH LAB;  Service: Cardiovascular;  Laterality: N/A;   left leg stent      POLYPECTOMY  04/29/2020   Procedure: POLYPECTOMY;  Surgeon: Wilford Corner, MD;  Location: [name] ENDOSCOPY;  Service: Endoscopy;;   POLYPECTOMY  07/17/2021   Procedure: POLYPECTOMY;  Surgeon:  Wilford Corner, MD;  Location: [facility];  Service: Endoscopy;;    FAMHx:  Family History  Problem Relation Age of Onset   Cancer Father    Hypertension Mother    Diabetes Mother    Breast cancer Mother    Hypertension Brother    Diabetes Brother    Hypertension Sister    Diabetes Sister     SOCHx:   reports that he has never smoked. He has never used smokeless tobacco. He reports that he does not drink alcohol and does not use drugs.  ALLERGIES:  Allergies  Allergen Reactions   Coconut Flavor [Flavoring Agent] Hives   Coconut Oil Hives   Ibuprofen Other (See Comments)    Made gastric ulcers worse   Aleve [Naproxen] Other (See Comments)    DUE TO KIDNEYS   Nsaids Other (See Comments)    Stomach ulcers    Vascepa [Icosapent Ethyl] Other (See Comments)    headaches, chest pain, similar to sx of a  stroke, hypotension     ROS: Pertinent items noted in HPI and remainder of comprehensive ROS otherwise negative.  HOME MEDS: Current Outpatient Medications on File Prior to Visit  Medication Sig Dispense Refill   Accu-Chek FastClix Lancets MISC 1 Device by Other route 2 (two) times daily.      ACCU-CHEK GUIDE test strip 1 each by Other route 2 (two) times daily.     albuterol (VENTOLIN HFA) 108 (90 Base) MCG/ACT inhaler Inhale 2 puffs into the lungs every 6 (six) hours as needed for wheezing or shortness of breath.     apixaban (ELIQUIS) 5 MG TABS tablet Take 2 tablets ('10mg'$ ) twice daily for 7 days, then 1 tablet ('5mg'$ ) twice daily (Patient taking differently: Take 5 mg by mouth 2 (two) times daily.) 60 tablet 0   atorvastatin (LIPITOR) 40 MG tablet Take 40 mg by mouth at bedtime.     carvedilol (COREG) 12.5 MG tablet Take 12.5 mg by mouth 2 (two) times daily.     cyanocobalamin 1000 MCG tablet Take by mouth.     diclofenac (FLECTOR) 1.3 % PTCH Place 1 patch onto the skin 2 (two) times daily as needed (back and knee pain).     fenofibrate (TRICOR) 145 MG tablet Take 1 tablet (145 mg total) by mouth daily. (Patient taking differently: Take 145 mg by mouth every morning.) 90 tablet 2   furosemide (LASIX) 40 MG tablet Take 1 tablet (40 mg total) by mouth 3 (three) times daily.     gabapentin (NEURONTIN) 800 MG tablet Take 800 mg by mouth 3 (three) times daily.     glipiZIDE (GLUCOTROL) 10 MG tablet Take 10 mg by mouth 2 (two) times daily.  4   HUMULIN R U-500 KWIKPEN 500 UNIT/ML kwikpen Inject 80-95 Units into the skin in the morning, at noon, and at bedtime. Per sliding scale based on CBG     Insulin Pen Needle 31G X 5 MM MISC Use 1 needle daily to inject insulin as prescribed 100 each 2   ipratropium-albuterol (DUONEB) 0.5-2.5 (3) MG/3ML SOLN Take 3 mLs by nebulization 2 (two) times daily.     isosorbide mononitrate (IMDUR) 30 MG 24 hr tablet TAKE 1 TABLET BY MOUTH DAILY 28 tablet 1    lisinopril (ZESTRIL) 20 MG tablet Take 1 tablet (20 mg total) by mouth every morning. 90 tablet 3   Menthol, Topical Analgesic, (BIOFREEZE EX) Apply 1 application topically 2 (two) times daily as needed (knee pain).  nitroGLYCERIN (NITROSTAT) 0.4 MG SL tablet Place 1 tablet (0.4 mg total) under the tongue every 5 (five) minutes as needed for chest pain. 100 tablet 1   ondansetron (ZOFRAN) 4 MG tablet Take 4 mg by mouth 2 (two) times daily as needed for nausea/vomiting.     Oxycodone HCl 20 MG TABS Take 20 mg by mouth 3 (three) times daily as needed (pain).     pantoprazole (PROTONIX) 40 MG tablet Take 40 mg by mouth every morning.     potassium chloride (MICRO-K) 10 MEQ CR capsule Take 20 mEq by mouth 2 (two) times daily.     sertraline (ZOLOFT) 50 MG tablet Take 50 mg by mouth every morning.     tiZANidine (ZANAFLEX) 4 MG tablet Take 4 mg by mouth at bedtime as needed for muscle spasms.     traZODone (DESYREL) 50 MG tablet Take 1 tablet (50 mg total) by mouth at bedtime. 30 tablet 11   Vitamin D, Ergocalciferol, (DRISDOL) 1.25 MG (50000 UT) CAPS capsule Take 50,000 Units by mouth every Monday.     No current facility-administered medications on file prior to visit.    LABS/IMAGING: No results found for this or any previous visit (from the past 48 hour(s)). No results found.  LIPID PANEL:    Component Value Date/Time   CHOL 126 12/22/2020 0925   TRIG 418 (H) 12/22/2020 0925   HDL 29 (L) 12/22/2020 0925   CHOLHDL 4.3 12/22/2020 0925   CHOLHDL 4.3 03/08/2019 1008   VLDL 43 (H) 12/08/2017 0212   LDLCALC 37 12/22/2020 0925   LDLCALC  03/08/2019 1008     Comment:     . LDL cholesterol not calculated. Triglyceride levels greater than 400 mg/dL invalidate calculated LDL results. . Reference range: <100 . Desirable range <100 mg/dL for primary prevention;   <70 mg/dL for patients with CHD or diabetic patients  with > or = 2 CHD risk factors. Marland Kitchen LDL-C is now calculated using the  Martin-Hopkins  calculation, which is a validated novel method providing  better accuracy than the Friedewald equation in the  estimation of LDL-C.  Cresenciano Genre et al. Annamaria Helling. 2671;245(80): 2061-2068  (http://education.[employer].com/faq/FAQ164)    LDLDIRECT 46 12/22/2020 0925    WEIGHTS: Wt Readings from Last 3 Encounters:  12/27/21 (!) 345 lb (156.5 kg)  12/21/21 (!) 348 lb (157.9 kg)  11/17/21 (!) 348 lb 9.6 oz (158.1 kg)    VITALS: BP 140/68    Pulse 66    Ht '5\' 10"'$  (1.778 m)    Wt (!) 345 lb (156.5 kg)    SpO2 97%    BMI 49.50 kg/m   EXAM: Deferred  EKG: Deferred  ASSESSMENT: Coronary artery disease status post PCI to the LAD (2017) Insulin-dependent diabetes-uncontrolled, hemoglobin A1c 11.6 Morbid obesity with recent weight loss Hypertension Mixed dyslipidemia with very high triglycerides Intermittent prescription steroid use History of pancreatitis  PLAN: 1.   [name] had recently had elevated triglycerides but not quite high enough to qualify for the core research trial.  He is on statin, fibrate and could not take Vascepa.  We will go ahead and repeat his triglycerides if they remain elevated consider possible over-the-counter fish oil, though he did have side effects with Vascepa.  Pixie Casino, MD, [facility], [place] Director of the Advanced Lipid Disorders &  Cardiovascular Risk Reduction Clinic Diplomate of the American Board of Clinical Lipidology Attending Cardiologist  Direct Dial: [phone]  Fax: [phone]  Website:  [email or web address] 12/27/2021, 9:14 AM

## 2021-12-27 NOTE — Patient Instructions (Signed)
Medication Instructions:  ?Your physician recommends that you continue on your current medications as directed. Please refer to the Current Medication list given to you today. ? ?*If you need a refill on your cardiac medications before your next appointment, please call your pharmacy* ? ? ?Lab Work: ?Lipid Panel, Direct LDL today  ? ?If you have labs (blood work) drawn today and your tests are completely normal, you will receive your results only by: ?MyChart Message (if you have MyChart) OR ?A paper copy in the mail ?If you have any lab test that is abnormal or we need to change your treatment, we will call you to review the results. ? ? ?Testing/Procedures: ?NONE ? ? ?Follow-Up: ?At [facility], you and your health needs are our priority.  As part of our continuing mission to provide you with exceptional heart care, we have created designated Provider Care Teams.  These Care Teams include your primary Cardiologist (physician) and Advanced Practice Providers (APPs -  Physician Assistants and Nurse Practitioners) who all work together to provide you with the care you need, when you need it. ? ?We recommend signing up for the patient portal called "MyChart".  Sign up information is provided on this After Visit Summary.  MyChart is used to connect with patients for Virtual Visits (Telemedicine).  Patients are able to view lab/test results, encounter notes, upcoming appointments, etc.  Non-urgent messages can be sent to your provider as well.   ?To learn more about what you can do with MyChart, go to [email or web address].   ? ?Your next appointment:   ? ?1 year with Dr. Debara Pickett -- lipid clinic ?

## 2021-12-28 ENCOUNTER — Encounter: Payer: Self-pay | Admitting: Internal Medicine

## 2021-12-28 LAB — BASIC METABOLIC PANEL
BUN/Creatinine Ratio: 16 (ref 9–20)
BUN: 14 mg/dL (ref 6–24)
CO2: 25 mmol/L (ref 20–29)
Calcium: 9.4 mg/dL (ref 8.7–10.2)
Chloride: 104 mmol/L (ref 96–106)
Creatinine, Ser: 0.87 mg/dL (ref 0.76–1.27)
Glucose: 180 mg/dL — ABNORMAL HIGH (ref 70–99)
Potassium: 3.7 mmol/L (ref 3.5–5.2)
Sodium: 145 mmol/L — ABNORMAL HIGH (ref 134–144)
eGFR: 100 mL/min/{1.73_m2} (ref >59)

## 2021-12-28 LAB — PRO B NATRIURETIC PEPTIDE: NT-Pro BNP: 108 pg/mL (ref 0–210)

## 2021-12-31 NOTE — Research (Unsigned)
Medications Reviewed ?Atorvastatin- ordered 01/06/16 for Dyslipidemia-by Dr. Kirk Ruths ?Albuterol-ordered 11/06/18 for chest tightness- by Dr. Robyn Haber ?Aspirin-ordered 06/01/16 for Coronary Artery Disease by Dr. Benjaman Lobe ?Carvedilol-ordered 11/30/16 for hypertension by Dr. Ledell Noss ?Diclofenac-ordered 11/30/16 for Left Knee Pain by Dr. Ledell Noss ?Famotidine noted on 05/31/16 for peptic ulcer/peptic ulcer disease by Dr. Vira Blanco ?Fenofibrate-ordered 11/04/19 for Dyslipidemia by Dr. Lyman Bishop ?Furosemide-ordered 08/20/20 for Congestive heart failure by Dr. Irine Seal ?Gabapentin-ordered 12/04/14 for Diabetic polyneuropathy by Dr. Lauraine Rinne ?Glipizide- noted on 01/29/14 for poorly controlled insulin dependent diabetes with peripheral neuropathy by Dr. Lyman Bishop ?Humulin-ordered 06/04/20 for type 2 diabetes mellitus with hyperglycemia, without long-term current use of insulin by Peri Jefferson, PA-C  ?Isosorbide-ordered 01/13/16 for chest pain by Dr. Roxan Hockey ?Lisinopril-ordered 01/29/14 for hypertension by Dr. Lyman Bishop ?Nitroglycerin-ordered 11/21/46 for chest pain by Dr. Dani Gobble Croitoru ?Ondansetron-ordered 05/28/14 for nausea by Dr. Jana Hakim ?Oxycodone-ordered 05/28/14 for back pain by Dr. Jana Hakim ?Potassium-noted 06/17/14 for hypokalemia by Mikey Bussing, PA-C ?Sertraline-ordered 10/23/14 for depression by Dr. Lauraine Rinne ?Tizanidine-ordered 04/18/17 for muscle spasms by Dr. Lynden Oxford ?

## 2022-01-03 ENCOUNTER — Other Ambulatory Visit: Payer: Self-pay | Admitting: Internal Medicine

## 2022-01-05 NOTE — Therapy (Signed)
?OUTPATIENT PHYSICAL THERAPY THORACOLUMBAR EVALUATION ? ? ?Patient Name: [name] ?MRN: [id number] ?DOB:[birth year], 61 y.o., male ?Today's Date: 01/10/2022 ? ? PT End of Session - 01/10/22 0829   ? ? Visit Number 1   ? Date for PT Re-Evaluation 02/21/22   ? Authorization Type [employer]   ? Authorization Time Period tbd   ? Progress Note Due on Visit 10   ? PT Start Time 0831   ? PT Stop Time 7262   ? PT Time Calculation (min) 49 min   ? Activity Tolerance Patient tolerated treatment well   ? Behavior During Therapy [facility] for tasks assessed/performed   ? ?  ?  ? ?  ? ? ?Past Medical History:  ?Diagnosis Date  ? Anemia   ? Arthritis   ? Back pain   ? CAD (coronary artery disease)   ? a. s/p DES to LAD in 05/2016  ? Cervical radiculopathy   ? Chronic diastolic CHF (congestive heart failure) ([place])   ? Chronic pain   ? Depression   ? DVT (deep venous thrombosis) ([place])   ? Hematemesis   ? Hepatic steatosis   ? Hyperlipidemia   ? Hypertension   ? IBS (irritable bowel syndrome)   ? Morbid obesity ([place])   ? OSA (obstructive sleep apnea)   ? Pancreatitis   ? PE (pulmonary thromboembolism) ([place])   ? Peripheral neuropathy   ? PUD (peptic ulcer disease)   ? Renal disorder   ? Stroke [facility])   ? a. ?details unclear - not seen on imaging when he was admitted in 05/2017 for TIA symptoms which were felt due to cervical radiculopathy.  ? Thoracic aortic ectasia (HCC)   ? a. 4.3cm ectatic ascending thoracic aorta by CT 06/2017.   ? Type 2 diabetes mellitus ([place])   ? ?Past Surgical History:  ?Procedure Laterality Date  ? CARDIAC CATHETERIZATION N/A 05/31/2016  ? Procedure: Left Heart Cath and Coronary Angiography;  Surgeon: Peter M Martinique, MD;  Location: [place] CV LAB;  Service: Cardiovascular;  Laterality: N/A;  ? CARDIAC CATHETERIZATION N/A 05/31/2016  ? Procedure: Intravascular Pressure Wire/FFR Study;  Surgeon: Peter M Martinique, MD;  Location: [place] CV LAB;  Service: Cardiovascular;  Laterality: N/A;  ? CARDIAC  CATHETERIZATION N/A 05/31/2016  ? Procedure: Coronary Stent Intervention;  Surgeon: Peter M Martinique, MD;  Location: [place] CV LAB;  Service: Cardiovascular;  Laterality: N/A;  ? COLONOSCOPY N/A 07/17/2021  ? Procedure: COLONOSCOPY;  Surgeon: Wilford Corner, MD;  Location: [facility] ENDOSCOPY;  Service: Endoscopy;  Laterality: N/A;  ? COLONOSCOPY WITH PROPOFOL N/A 04/29/2020  ? Procedure: COLONOSCOPY WITH PROPOFOL;  Surgeon: Wilford Corner, MD;  Location: [place];  Service: Endoscopy;  Laterality: N/A;  ? ESOPHAGOGASTRODUODENOSCOPY N/A 04/29/2020  ? Procedure: ESOPHAGOGASTRODUODENOSCOPY (EGD);  Surgeon: Wilford Corner, MD;  Location: [place];  Service: Endoscopy;  Laterality: N/A;  ? ESOPHAGOGASTRODUODENOSCOPY (EGD) WITH PROPOFOL N/A 07/17/2021  ? Procedure: ESOPHAGOGASTRODUODENOSCOPY (EGD) WITH PROPOFOL;  Surgeon: Wilford Corner, MD;  Location: [facility];  Service: Endoscopy;  Laterality: N/A;  ? LEFT HEART CATH AND CORONARY ANGIOGRAPHY N/A 12/08/2017  ? Procedure: LEFT HEART CATH AND CORONARY ANGIOGRAPHY;  Surgeon: Leonie Man, MD;  Location: [place] CV LAB;  Service: Cardiovascular;  Laterality: N/A;  ? LEFT HEART CATHETERIZATION WITH CORONARY ANGIOGRAM N/A 02/03/2014  ? Procedure: LEFT HEART CATHETERIZATION WITH CORONARY ANGIOGRAM;  Surgeon: Pixie Casino, MD;  Location: [facility] CATH LAB;  Service: Cardiovascular;  Laterality: N/A;  ? left  leg stent     ? POLYPECTOMY  04/29/2020  ? Procedure: POLYPECTOMY;  Surgeon: Wilford Corner, MD;  Location: [facility] ENDOSCOPY;  Service: Endoscopy;;  ? POLYPECTOMY  07/17/2021  ? Procedure: POLYPECTOMY;  Surgeon: Wilford Corner, MD;  Location: [facility] ENDOS[facility];  Service: Endoscopy;;  ? ?Patient Active Problem List  ? Diagnosis Date Noted  ? GI bleed 07/15/2021  ? Pulmonary embolism ([place]) 07/15/2021  ? Acute pulmonary embolism ([place]) 06/26/2021  ? Diabetic ulcer of lower leg ([place]) 06/26/2021  ? Acute respiratory failure with hypoxia ([place]) 06/26/2021  ? COVID-19 virus  infection 06/26/2021  ? Lumbar radiculitis 03/30/2021  ? Morbid obesity ([place]) 01/15/2021  ? Personal history of colonic polyps 01/15/2021  ? Rectal bleeding 01/15/2021  ? Pain due to onychomycosis of toenails of both feet 01/15/2021  ? Epistaxis, recurrent 10/21/2020  ? Laceration of nose 10/21/2020  ? Syncope 10/15/2020  ? OSA (obstructive sleep apnea)   ? Bilateral carotid artery stenosis 07/28/2020  ? Bilateral lower extremity edema 07/28/2020  ? CHF (congestive heart failure) ([place]) 07/28/2020  ? DDD (degenerative disc disease), lumbar 07/28/2020  ? Diabetic peripheral neuropathy ([place]) 07/28/2020  ? Fatty liver 07/28/2020  ? Irritable bowel syndrome with diarrhea 07/28/2020  ? Osteoarthritis of right hip 07/28/2020  ? Upper GI bleed 04/28/2020  ? Hypertensive urgency 04/27/2020  ? Acute GI bleeding 04/27/2020  ? Mixed diabetic hyperlipidemia associated with type 2 diabetes mellitus ([place]) 04/27/2020  ? Rotator cuff arthropathy 10/30/2019  ? Cervical radiculopathy 09/24/2019  ? Trochanteric bursitis of right hip 09/24/2019  ? Chronic diastolic CHF (congestive heart failure) ([place]) 08/23/2019  ? ARF (acute renal failure) ([place]) 08/01/2019  ? Hyperkalemia 08/01/2019  ? Uncontrolled type 2 diabetes mellitus with hyperglycemia ([place]) 08/01/2019  ? AKI (acute kidney injury) ([place])   ? Hypotension 07/31/2019  ? DKA (diabetic ketoacidosis) ([place]) 05/05/2019  ? Blurred vision 05/05/2019  ? Trochanteric bursitis 02/11/2019  ? Long-term insulin use ([place]) 01/25/2019  ? Alteration consciousness 11/19/2018  ? Neck pain 10/09/2018  ? Left arm weakness 10/09/2018  ? B12 deficiency 08/10/2017  ? Persistent headaches 08/09/2017  ? GERD (gastroesophageal reflux disease) 06/29/2017  ? Left arm numbness   ? Cerebral embolism with cerebral infarction 06/17/2017  ? TIA (transient ischemic attack) 06/17/2017  ? Chronic back pain 12/29/2016  ? Depression 12/15/2016  ? Vitamin D deficiency 12/09/2016  ? Right hip pain 12/07/2016  ? Hypokalemia  12/07/2016  ? Type 2 diabetes mellitus with vascular disease ([place]) 05/31/2016  ? Normocytic normochromic anemia 05/31/2016  ? Chest pain 05/31/2016  ? Coronary artery disease involving native coronary artery of native heart without angina pectoris 01/06/2016  ? Lactic acidosis 05/28/2014  ? Nonspecific chest pain 01/29/2014  ? Uncontrolled secondary diabetes with peripheral neuropathy ([place]) 01/29/2014  ? Obesity, Class III, BMI 40-49.9 (morbid obesity) ([place]) 01/29/2014  ? Snoring 01/29/2014  ? Dyslipidemia 01/29/2014  ? HTN (hypertension) 01/29/2014  ? Abnormal nuclear stress test 01/29/2014  ? ? ?PCP: [name], FNP ? ?REFERRING PROVIDER: [name], FNP ? ?REFERRING DIAG: back pain, wrist pain ? ?THERAPY DIAG:  ?Chronic right-sided low back pain with right-sided sciatica ? ?Muscle weakness (generalized) ? ?Other abnormalities of gait and mobility ? ?Abnormal posture ? ?ONSET DATE: 2 years ago ? ?SUBJECTIVE:                                                                                                                                                                                          ? ?  SUBJECTIVE STATEMENT: ?Pinched nerve in spine. Walking makes me feel shaky to where I feel like I can fall. I have bad discs in lower part of spine. Have numbness/tingling in right LE that lasts 15-20 minutes every day. On the 29th, [facility].  ?PERTINENT HISTORY:  ?Carpal tunnel surgery on Left wrist ?Heart attack and strokes back in 2018 ? ?PAIN:  ?Are you having pain? Yes:  ?NRPS: 7/10 ?At worst: 10/10 at rest and walking ?Pain location: R low side ?Pain description: aching, dull, sometimes sharp ?Aggravating factors: walking and rest ?Relieving factors: pain meds ? ?PRECAUTIONS: None ? ?WEIGHT BEARING RESTRICTIONS No ? ?FALLS:  ?Has patient fallen in last 6 months? Yes, Number of falls: 2 when doing sit to stands ? ?LIVING ENVIRONMENT: ?Lives with: lives alone ?Lives  in: House/apartment ?Stairs: No ?Has following equipment at home: Single point cane and Walker - 4 wheeled when I need it ? ?OCCUPATION: disabled ? ?PLOF: Independent ? ?PATIENT GOALS To alleviate pain. ? ? ?

## 2022-01-10 ENCOUNTER — Ambulatory Visit: Payer: Medicaid Other | Attending: Family Medicine

## 2022-01-10 ENCOUNTER — Other Ambulatory Visit: Payer: Self-pay

## 2022-01-10 DIAGNOSIS — G8929 Other chronic pain: Secondary | ICD-10-CM

## 2022-01-10 DIAGNOSIS — M5441 Lumbago with sciatica, right side: Secondary | ICD-10-CM | POA: Diagnosis not present

## 2022-01-10 DIAGNOSIS — R293 Abnormal posture: Secondary | ICD-10-CM | POA: Diagnosis present

## 2022-01-10 DIAGNOSIS — M6281 Muscle weakness (generalized): Secondary | ICD-10-CM

## 2022-01-10 DIAGNOSIS — R2689 Other abnormalities of gait and mobility: Secondary | ICD-10-CM

## 2022-01-12 ENCOUNTER — Other Ambulatory Visit: Payer: Self-pay

## 2022-01-12 ENCOUNTER — Encounter (HOSPITAL_COMMUNITY): Payer: Self-pay | Admitting: Emergency Medicine

## 2022-01-12 ENCOUNTER — Emergency Department (HOSPITAL_COMMUNITY): Payer: Medicaid Other

## 2022-01-12 ENCOUNTER — Observation Stay (HOSPITAL_COMMUNITY): Payer: Medicaid Other

## 2022-01-12 ENCOUNTER — Observation Stay (HOSPITAL_COMMUNITY)
Admission: EM | Admit: 2022-01-12 | Discharge: 2022-01-13 | Disposition: A | Discharge status: Home / Self Care | Payer: Medicaid Other | Attending: Internal Medicine | Admitting: Internal Medicine

## 2022-01-12 DIAGNOSIS — G9340 Encephalopathy, unspecified: Secondary | ICD-10-CM | POA: Insufficient documentation

## 2022-01-12 DIAGNOSIS — Z7901 Long term (current) use of anticoagulants: Secondary | ICD-10-CM | POA: Diagnosis not present

## 2022-01-12 DIAGNOSIS — N179 Acute kidney failure, unspecified: Principal | ICD-10-CM | POA: Diagnosis present

## 2022-01-12 DIAGNOSIS — E1159 Type 2 diabetes mellitus with other circulatory complications: Secondary | ICD-10-CM | POA: Diagnosis not present

## 2022-01-12 DIAGNOSIS — Z86711 Personal history of pulmonary embolism: Secondary | ICD-10-CM | POA: Insufficient documentation

## 2022-01-12 DIAGNOSIS — I110 Hypertensive heart disease with heart failure: Secondary | ICD-10-CM | POA: Diagnosis not present

## 2022-01-12 DIAGNOSIS — Z8673 Personal history of transient ischemic attack (TIA), and cerebral infarction without residual deficits: Secondary | ICD-10-CM | POA: Diagnosis not present

## 2022-01-12 DIAGNOSIS — E66813 Obesity, class 3: Secondary | ICD-10-CM | POA: Diagnosis present

## 2022-01-12 DIAGNOSIS — K921 Melena: Secondary | ICD-10-CM | POA: Diagnosis not present

## 2022-01-12 DIAGNOSIS — I959 Hypotension, unspecified: Secondary | ICD-10-CM | POA: Insufficient documentation

## 2022-01-12 DIAGNOSIS — R531 Weakness: Secondary | ICD-10-CM | POA: Diagnosis present

## 2022-01-12 DIAGNOSIS — E1142 Type 2 diabetes mellitus with diabetic polyneuropathy: Secondary | ICD-10-CM | POA: Diagnosis not present

## 2022-01-12 DIAGNOSIS — Z86718 Personal history of other venous thrombosis and embolism: Secondary | ICD-10-CM | POA: Insufficient documentation

## 2022-01-12 DIAGNOSIS — I10 Essential (primary) hypertension: Secondary | ICD-10-CM | POA: Diagnosis present

## 2022-01-12 DIAGNOSIS — Z79899 Other long term (current) drug therapy: Secondary | ICD-10-CM | POA: Insufficient documentation

## 2022-01-12 DIAGNOSIS — I2510 Atherosclerotic heart disease of native coronary artery without angina pectoris: Secondary | ICD-10-CM | POA: Diagnosis not present

## 2022-01-12 DIAGNOSIS — Z7984 Long term (current) use of oral hypoglycemic drugs: Secondary | ICD-10-CM | POA: Diagnosis not present

## 2022-01-12 DIAGNOSIS — Z794 Long term (current) use of insulin: Secondary | ICD-10-CM | POA: Insufficient documentation

## 2022-01-12 DIAGNOSIS — I5032 Chronic diastolic (congestive) heart failure: Secondary | ICD-10-CM | POA: Diagnosis not present

## 2022-01-12 DIAGNOSIS — Z20822 Contact with and (suspected) exposure to covid-19: Secondary | ICD-10-CM | POA: Diagnosis not present

## 2022-01-12 DIAGNOSIS — E1165 Type 2 diabetes mellitus with hyperglycemia: Secondary | ICD-10-CM | POA: Insufficient documentation

## 2022-01-12 LAB — LACTIC ACID, PLASMA
Lactic Acid, Venous: 3.9 mmol/L (ref 0.5–1.9)
Lactic Acid, Venous: 3.5 mmol/L (ref 0.5–1.9)

## 2022-01-12 LAB — COMPREHENSIVE METABOLIC PANEL
ALT: 15 U/L (ref 0–44)
AST: 30 U/L (ref 15–41)
Albumin: 3.3 g/dL — ABNORMAL LOW (ref 3.5–5.0)
Alkaline Phosphatase: 38 U/L (ref 38–126)
Anion gap: 13 (ref 5–15)
BUN: 30 mg/dL — ABNORMAL HIGH (ref 6–20)
CO2: 21 mmol/L — ABNORMAL LOW (ref 22–32)
Calcium: 9.4 mg/dL (ref 8.9–10.3)
Chloride: 100 mmol/L (ref 98–111)
Creatinine, Ser: 1.9 mg/dL — ABNORMAL HIGH (ref 0.61–1.24)
GFR, Estimated: 40 mL/min — ABNORMAL LOW (ref >60)
Glucose, Bld: 273 mg/dL — ABNORMAL HIGH (ref 70–99)
Potassium: 4.3 mmol/L (ref 3.5–5.1)
Sodium: 134 mmol/L — ABNORMAL LOW (ref 135–145)
Total Bilirubin: 1 mg/dL (ref 0.3–1.2)
Total Protein: 5.9 g/dL — ABNORMAL LOW (ref 6.5–8.1)

## 2022-01-12 LAB — BASIC METABOLIC PANEL
Anion gap: 11 (ref 5–15)
BUN: 31 mg/dL — ABNORMAL HIGH (ref 6–20)
CO2: 24 mmol/L (ref 22–32)
Calcium: 9.4 mg/dL (ref 8.9–10.3)
Chloride: 104 mmol/L (ref 98–111)
Creatinine, Ser: 2.19 mg/dL — ABNORMAL HIGH (ref 0.61–1.24)
GFR, Estimated: 34 mL/min — ABNORMAL LOW (ref >60)
Glucose, Bld: 206 mg/dL — ABNORMAL HIGH (ref 70–99)
Potassium: 4.4 mmol/L (ref 3.5–5.1)
Sodium: 139 mmol/L (ref 135–145)

## 2022-01-12 LAB — CBC WITH DIFFERENTIAL/PLATELET
Abs Immature Granulocytes: 0.02 10*3/uL (ref 0.00–0.07)
Basophils Absolute: 0 10*3/uL (ref 0.0–0.1)
Basophils Relative: 1 %
Eosinophils Absolute: 0.2 10*3/uL (ref 0.0–0.5)
Eosinophils Relative: 2 %
HCT: 35.8 % — ABNORMAL LOW (ref 39.0–52.0)
Hemoglobin: 11.5 g/dL — ABNORMAL LOW (ref 13.0–17.0)
Immature Granulocytes: 0 %
Lymphocytes Relative: 21 %
Lymphs Abs: 1.8 10*3/uL (ref 0.7–4.0)
MCH: 30.7 pg (ref 26.0–34.0)
MCHC: 32.1 g/dL (ref 30.0–36.0)
MCV: 95.5 fL (ref 80.0–100.0)
Monocytes Absolute: 0.7 10*3/uL (ref 0.1–1.0)
Monocytes Relative: 8 %
Neutro Abs: 5.9 10*3/uL (ref 1.7–7.7)
Neutrophils Relative %: 68 %
Platelets: 186 10*3/uL (ref 150–400)
RBC: 3.75 MIL/uL — ABNORMAL LOW (ref 4.22–5.81)
RDW: 13.5 % (ref 11.5–15.5)
WBC: 8.6 10*3/uL (ref 4.0–10.5)
nRBC: 0 % (ref 0.0–0.2)

## 2022-01-12 LAB — URINALYSIS, COMPLETE (UACMP) WITH MICROSCOPIC
Bilirubin Urine: NEGATIVE
Glucose, UA: >=500 mg/dL — AB
Ketones, ur: NEGATIVE mg/dL
Leukocytes,Ua: NEGATIVE
Nitrite: NEGATIVE
Protein, ur: NEGATIVE mg/dL
Specific Gravity, Urine: 1.005 (ref 1.005–1.030)
pH: 5 (ref 5.0–8.0)

## 2022-01-12 LAB — CBC
HCT: 35.3 % — ABNORMAL LOW (ref 39.0–52.0)
Hemoglobin: 11.2 g/dL — ABNORMAL LOW (ref 13.0–17.0)
MCH: 30.5 pg (ref 26.0–34.0)
MCHC: 31.7 g/dL (ref 30.0–36.0)
MCV: 96.2 fL (ref 80.0–100.0)
Platelets: 189 10*3/uL (ref 150–400)
RBC: 3.67 MIL/uL — ABNORMAL LOW (ref 4.22–5.81)
RDW: 13.4 % (ref 11.5–15.5)
WBC: 8.5 10*3/uL (ref 4.0–10.5)
nRBC: 0 % (ref 0.0–0.2)

## 2022-01-12 LAB — I-STAT ARTERIAL BLOOD GAS, ED
Acid-base deficit: 1 mmol/L (ref 0.0–2.0)
Bicarbonate: 25 mmol/L (ref 20.0–28.0)
Calcium, Ion: 1.27 mmol/L (ref 1.15–1.40)
HCT: 32 % — ABNORMAL LOW (ref 39.0–52.0)
Hemoglobin: 10.9 g/dL — ABNORMAL LOW (ref 13.0–17.0)
O2 Saturation: 98 %
Patient temperature: 98.4
Potassium: 4.5 mmol/L (ref 3.5–5.1)
Sodium: 138 mmol/L (ref 135–145)
TCO2: 26 mmol/L (ref 22–32)
pCO2 arterial: 47.5 mmHg (ref 32–48)
pH, Arterial: 7.328 — ABNORMAL LOW (ref 7.35–7.45)
pO2, Arterial: 106 mmHg (ref 83–108)

## 2022-01-12 LAB — TYPE AND SCREEN
ABO/RH(D): O POS
Antibody Screen: NEGATIVE

## 2022-01-12 LAB — TROPONIN I (HIGH SENSITIVITY)
Troponin I (High Sensitivity): 10 ng/L (ref <18)
Troponin I (High Sensitivity): 9 ng/L (ref <18)

## 2022-01-12 LAB — TSH: TSH: 1.909 u[IU]/mL (ref 0.350–4.500)

## 2022-01-12 LAB — GLUCOSE, CAPILLARY: Glucose-Capillary: 300 mg/dL — ABNORMAL HIGH (ref 70–99)

## 2022-01-12 LAB — RESP PANEL BY RT-PCR (FLU A&B, COVID) ARPGX2
Influenza A by PCR: NEGATIVE
Influenza B by PCR: NEGATIVE
SARS Coronavirus 2 by RT PCR: NEGATIVE

## 2022-01-12 LAB — MAGNESIUM: Magnesium: 1.9 mg/dL (ref 1.7–2.4)

## 2022-01-12 LAB — CBG MONITORING, ED
Glucose-Capillary: 275 mg/dL — ABNORMAL HIGH (ref 70–99)
Glucose-Capillary: 156 mg/dL — ABNORMAL HIGH (ref 70–99)
Glucose-Capillary: 173 mg/dL — ABNORMAL HIGH (ref 70–99)

## 2022-01-12 LAB — VITAMIN B12: Vitamin B-12: 304 pg/mL (ref 180–914)

## 2022-01-12 LAB — HIV ANTIBODY (ROUTINE TESTING W REFLEX): HIV Screen 4th Generation wRfx: NONREACTIVE

## 2022-01-12 LAB — HEMOGLOBIN A1C
Hgb A1c MFr Bld: 9.3 % — ABNORMAL HIGH (ref 4.8–5.6)
Mean Plasma Glucose: 220.21 mg/dL

## 2022-01-12 LAB — CK: Total CK: 110 U/L (ref 49–397)

## 2022-01-12 MED ORDER — SERTRALINE HCL 50 MG PO TABS
50.0000 mg | ORAL_TABLET | Freq: Every morning | ORAL | Status: DC
  Administered 2022-01-12 – 2022-01-13 (×2): 50 mg via ORAL
  Filled 2022-01-12 (×2): qty 1

## 2022-01-12 MED ORDER — INSULIN ASPART 100 UNIT/ML IJ SOLN
0.0000 [IU] | Freq: Three times a day (TID) | INTRAMUSCULAR | Status: DC
  Administered 2022-01-12: 3 [IU] via SUBCUTANEOUS
  Administered 2022-01-12: 4 [IU] via SUBCUTANEOUS
  Administered 2022-01-13 (×2): 11 [IU] via SUBCUTANEOUS

## 2022-01-12 MED ORDER — SENNOSIDES-DOCUSATE SODIUM 8.6-50 MG PO TABS: 1.0000 | ORAL_TABLET | Freq: Every evening | ORAL | Status: DC | PRN

## 2022-01-12 MED ORDER — APIXABAN 5 MG PO TABS: 5.0000 mg | ORAL_TABLET | Freq: Two times a day (BID) | ORAL | Status: DC

## 2022-01-12 MED ORDER — ACETAMINOPHEN 500 MG PO TABS
1000.0000 mg | ORAL_TABLET | Freq: Once | ORAL | Status: DC
  Filled 2022-01-12: qty 2

## 2022-01-12 MED ORDER — VANCOMYCIN HCL 2000 MG/400ML IV SOLN
2000.0000 mg | Freq: Once | INTRAVENOUS | Status: DC
  Filled 2022-01-12: qty 400

## 2022-01-12 MED ORDER — SODIUM CHLORIDE 0.9 % IV BOLUS
500.0000 mL | Freq: Once | INTRAVENOUS | Status: DC
Start: 2022-01-12 — End: 2022-01-12

## 2022-01-12 MED ORDER — INSULIN ASPART 100 UNIT/ML IJ SOLN
0.0000 [IU] | Freq: Every day | INTRAMUSCULAR | Status: DC
  Administered 2022-01-12: 3 [IU] via SUBCUTANEOUS

## 2022-01-12 MED ORDER — MENTHOL (TOPICAL ANALGESIC) 4 % EX GEL: 1.0000 "application " | Freq: Two times a day (BID) | CUTANEOUS | Status: DC | PRN

## 2022-01-12 MED ORDER — DICLOFENAC EPOLAMINE 1.3 % EX PTCH
1.0000 | MEDICATED_PATCH | Freq: Two times a day (BID) | CUTANEOUS | Status: DC | PRN
  Filled 2022-01-12: qty 1

## 2022-01-12 MED ORDER — INSULIN REGULAR HUMAN (CONC) 500 UNIT/ML ~~LOC~~ SOPN
20.0000 [IU] | PEN_INJECTOR | Freq: Three times a day (TID) | SUBCUTANEOUS | Status: DC
  Administered 2022-01-12 – 2022-01-13 (×2): 20 [IU] via SUBCUTANEOUS
  Filled 2022-01-12: qty 3

## 2022-01-12 MED ORDER — ACETAMINOPHEN 325 MG PO TABS: 650.0000 mg | ORAL_TABLET | Freq: Four times a day (QID) | ORAL | Status: DC | PRN

## 2022-01-12 MED ORDER — MUSCLE RUB 10-15 % EX CREA
TOPICAL_CREAM | Freq: Two times a day (BID) | CUTANEOUS | Status: DC | PRN
  Filled 2022-01-12: qty 85

## 2022-01-12 MED ORDER — PANTOPRAZOLE SODIUM 40 MG PO TBEC
40.0000 mg | DELAYED_RELEASE_TABLET | Freq: Every morning | ORAL | Status: DC
  Administered 2022-01-12 – 2022-01-13 (×2): 40 mg via ORAL
  Filled 2022-01-12 (×2): qty 1

## 2022-01-12 MED ORDER — INSULIN ASPART 100 UNIT/ML IJ SOLN: 0.0000 [IU] | Freq: Three times a day (TID) | INTRAMUSCULAR | Status: DC

## 2022-01-12 MED ORDER — INSULIN GLARGINE-YFGN 100 UNIT/ML ~~LOC~~ SOLN: 10.0000 [IU] | Freq: Every day | SUBCUTANEOUS | Status: DC

## 2022-01-12 MED ORDER — ATORVASTATIN CALCIUM 40 MG PO TABS
40.0000 mg | ORAL_TABLET | Freq: Every day | ORAL | Status: DC
  Administered 2022-01-12: 40 mg via ORAL
  Filled 2022-01-12: qty 1

## 2022-01-12 MED ORDER — PIPERACILLIN-TAZOBACTAM 3.375 G IVPB 30 MIN
3.3750 g | Freq: Once | INTRAVENOUS | Status: AC
  Administered 2022-01-12: 3.375 g via INTRAVENOUS
  Filled 2022-01-12: qty 50

## 2022-01-12 MED ORDER — SODIUM CHLORIDE 0.9 % IV BOLUS
1000.0000 mL | Freq: Once | INTRAVENOUS | Status: AC
Start: 2022-01-12 — End: 2022-01-12
  Administered 2022-01-12: 1000 mL via INTRAVENOUS

## 2022-01-12 MED ORDER — SODIUM CHLORIDE 0.9 % IV BOLUS
500.0000 mL | Freq: Once | INTRAVENOUS | Status: AC
  Administered 2022-01-12: 500 mL via INTRAVENOUS

## 2022-01-12 MED ORDER — ALBUTEROL SULFATE (2.5 MG/3ML) 0.083% IN NEBU: 3.0000 mL | INHALATION_SOLUTION | Freq: Four times a day (QID) | RESPIRATORY_TRACT | Status: DC | PRN

## 2022-01-12 MED ORDER — FENOFIBRATE 54 MG PO TABS
54.0000 mg | ORAL_TABLET | Freq: Every day | ORAL | Status: DC
Start: 2022-01-12 — End: 2022-01-13
  Administered 2022-01-12 – 2022-01-13 (×2): 54 mg via ORAL
  Filled 2022-01-12 (×2): qty 1

## 2022-01-12 MED ORDER — SODIUM CHLORIDE 0.9 % IV SOLN: INTRAVENOUS | Status: DC

## 2022-01-12 MED ORDER — SODIUM CHLORIDE 0.9 % IV BOLUS
1000.0000 mL | Freq: Once | INTRAVENOUS | Status: AC
  Administered 2022-01-12: 1000 mL via INTRAVENOUS

## 2022-01-12 MED ORDER — ACETAMINOPHEN 650 MG RE SUPP: 650.0000 mg | Freq: Four times a day (QID) | RECTAL | Status: DC | PRN

## 2022-01-12 NOTE — Hospital Course (Addendum)
#  Acute encephalopathy, resolved ?On presentation patient complained of weakness and syncope with loose BM in the few days preceding the event and new increased dose of Lisinopril over the last month. He was hypotensive and encephalopathic which showed gradual improvement with IFV resuscitation. Antihypertensives were held. He had improvement overnight to normotensive, at times hypertensive, state and had resolution of encephalopathy. ?  ?#AKI ?Admission labs revealed AKI with serum creatinine of 1.9. He received 2.5L IVF with repeat serum creatinine of 2.19. Overnight this improved to 1.81. Renal ultrasound did not show abnormalities and urinalysis was remarkable for glucose >500, small hemoglobin with 0-5 RBC, rare bacteria. Home lisinopril was held. ?  ?#Hematochezia ?Patient had BM with red discoloration in toilet bowl; of note he did endorse blood-streaked BM prior to presenting to the hospital. He declined rectal exam. Hemoglobin at admission was 11.2, which remained stable and overall improved prior to discharge with hemoglobin of 12.2. Home Eliquis was held at admission but resumed prior to discharge. He was hemodynamically stable throughout admission. ?  ?#T2DM with hyperglycemia, uncontrolled, complications of vascular disease and peripheral neuropathy.  ?HbA1c 9.3%. Managed with novoLOG SSI with meals and nighttime correction, as well as Humulin 4 20 units TID with meals.  ?  ?#Hx of HTN ?Hypotensive on admission with appropriate response to 2.5L IVF resuscitation and holding of home antihypertensives. Imdur and coreg were restarted prior to discharge but lasix and lisinopril were held. ?  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 Home Eliquis was held at admission given hematochezia but was restarted prior to discharge. ?  ?#HFpEF ?Chronic. Home Lasix held due to hypovolemic state. ?  ?#Chronic hip and back pain ?Chronic. Home pain regimen held given encephalopathy on presentation to ED and AKI. ?  ?#OSA, not on  CPAP ?CPAP offered for nightly use. ?

## 2022-01-12 NOTE — ED Notes (Signed)
Pt's O2 dropping to 83-85% on RA when asleep, pt placed on 2lpm [place]. Pt states that he normally wears 4-5lpm [place] PRN at night.  ?

## 2022-01-12 NOTE — ED Triage Notes (Signed)
Pt BIB EMS from home after waking up on the ground. States that he had got up for the day and then woke up on the ground. Pt c/o dizziness x a few days.  ?

## 2022-01-12 NOTE — Progress Notes (Signed)
Inpatient Diabetes Program Recommendations ? ?AACE/ADA: New Consensus Statement on Inpatient Glycemic Control (2015) ? ?Target Ranges:  Prepandial:   less than 140 mg/dL ?     Peak postprandial:   less than 180 mg/dL (1-2 hours) ?     Critically ill patients:  140 - 180 mg/dL  ? ?Lab Results  ?Component Value Date  ? GLUCAP 275 (H) 01/12/2022  ? HGBA1C 9.3 (H) 01/12/2022  ? ? ?Review of Glycemic Control ? Latest Reference Range & Units 01/12/22 04:23  ?Glucose-Capillary 70 - 99 mg/dL 275 (H)  ?(H): Data is abnormally high ?Diabetes history: DM 2 ?Outpatient Diabetes medications:  ?Glipizide 10 mg bid ?U500 insulin 8am 85 units +5 for every 20-50 units>130 mg/dL ?          2 pm 90 units ?                    10 pm 75 units ?Current orders for Inpatient glycemic control:  ?Novolog moderate tid with meals ?Semglee 10 units q HS ? ?Inpatient Diabetes Program Recommendations:   ? ?Will follow.  Patient was on very large doses of U500 prior to admit.  If blood sugars rise, consider adding 1/2 of base dose of U500  tid with meals- U500 40 units with breakfast, 45 units with lunch and 35 units with supper.   ? ?Thanks,  ?Adah Perl, RN, BC-ADM ?Inpatient Diabetes Coordinator ?Pager [phone]  (8a-5p) ? ? ?

## 2022-01-12 NOTE — ED Provider Notes (Signed)
?[place] ?Provider Note ? ? ?CSN: [id number] ?Arrival date & time: 01/12/22  0350 ? ?  ? ?History ? ?Chief Complaint  ?Patient presents with  ? Loss of Consciousness  ? ? ?[name] is a 61 y.o. male. ? ?Patient with history of hypertension, hyperlipidemia, diabetes, history of diastolic heart failure diagnosed during cath in 2019 --presents to the emergency department for evaluation of weakness.  Patient states that he sleeps in a chair.  He awoke this morning on the ground lying in front of the chair.  He tells me that he does not remember getting up.  He was very weak and could not stand himself up.  He was able to crawl over to a table and get a telephone call for help.  EMS transported to the hospital.  Patient denies chest pain/back or shortness of breath.  No weakness or numbness in extremities.  He has been taking his medications as prescribed without any missed doses per his report.  Review of cardiology notes show recently increased dose of lisinopril starting approximately 3 weeks ago.  He denies headache this morning but states that his hearing is "muffled".  No unilateral weakness, slurred speech or difficulty talking.  He denies recent vomiting or diarrhea, abdominal pain.  No urinary symptoms.  Noted to be hypotensive on arrival.   ? ? ?  ? ?Home Medications ?Prior to Admission medications   ?Medication Sig Start Date End Date Taking? Authorizing Provider  ?Accu-Chek FastClix Lancets MISC 1 Device by Other route 2 (two) times daily.  03/20/19  Yes [provider]  ?ACCU-CHEK GUIDE test strip 1 each by Other route 2 (two) times daily. 01/08/21  Yes [provider]  ?albuterol (VENTOLIN HFA) 108 (90 Base) MCG/ACT inhaler Inhale 2 puffs into the lungs every 6 (six) hours as needed for wheezing or shortness of breath.   Yes [provider]  ?apixaban (ELIQUIS) 5 MG TABS tablet Take 2 tablets ('10mg'$ ) twice daily for 7 days, then 1  tablet ('5mg'$ ) twice daily ?Patient taking differently: Take 5 mg by mouth 2 (two) times daily. 07/04/21  Yes Little Ishikawa, MD  ?atorvastatin (LIPITOR) 40 MG tablet Take 40 mg by mouth at bedtime.   Yes [provider]  ?carvedilol (COREG) 12.5 MG tablet Take 12.5 mg by mouth 2 (two) times daily. 10/22/20  Yes [provider]  ?cyanocobalamin 1000 MCG tablet Take by mouth. 03/06/20  Yes [provider]  ?diclofenac (FLECTOR) 1.3 % PTCH Place 1 patch onto the skin 2 (two) times daily as needed (back and knee pain).   Yes [provider]  ?fenofibrate (TRICOR) 145 MG tablet Take 1 tablet (145 mg total) by mouth daily. ?Patient taking differently: Take 145 mg by mouth every morning. 06/25/21  Yes Deberah Pelton, NP  ?furosemide (LASIX) 40 MG tablet Take 1 tablet (40 mg total) by mouth 3 (three) times daily. 10/27/21 10/27/22 Yes Sande Rives E, PA-C  ?gabapentin (NEURONTIN) 800 MG tablet Take 800 mg by mouth 3 (three) times daily. 09/29/21  Yes [provider]  ?glipiZIDE (GLUCOTROL) 10 MG tablet Take 10 mg by mouth 2 (two) times daily. 05/30/17  Yes [provider]  ?HUMULIN R U-500 KWIKPEN 500 UNIT/ML kwikpen Inject 0-110 Units into the skin See admin instructions. Per sliding scale 3 times daily with meals 06/04/20  Yes [provider]  ?Insulin Pen Needle 31G X 5 MM MISC Use 1 needle daily to inject insulin  as prescribed 06/18/17  Yes Barton Dubois, MD  ?isosorbide mononitrate (IMDUR) 30 MG 24 hr tablet TAKE 1 TABLET BY MOUTH DAILY ?Patient taking differently: Take 30 mg by mouth daily. 01/03/22  Yes Hilty, Nadean Corwin, MD  ?lisinopril (ZESTRIL) 20 MG tablet Take 1 tablet (20 mg total) by mouth every morning. 12/21/21  Yes Crenshaw, Denice Bors, MD  ?Menthol, Topical Analgesic, (BIOFREEZE EX) Apply 1 application topically 2 (two) times daily as needed (knee pain).   Yes [provider]  ?nitroGLYCERIN (NITROSTAT) 0.4 MG SL tablet Place 1 tablet (0.4 mg  total) under the tongue every 5 (five) minutes as needed for chest pain. 02/06/19  Yes Skeet Latch, MD  ?ondansetron (ZOFRAN) 4 MG tablet Take 4 mg by mouth 2 (two) times daily as needed for nausea/vomiting. 04/21/20  Yes [provider]  ?Oxycodone HCl 20 MG TABS Take 20 mg by mouth 4 (four) times daily as needed (pain).   Yes [provider]  ?pantoprazole (PROTONIX) 40 MG tablet Take 40 mg by mouth every morning. 04/13/21  Yes [provider]  ?potassium chloride (MICRO-K) 10 MEQ CR capsule Take 20 mEq by mouth 2 (two) times daily. 04/03/20  Yes [provider]  ?sertraline (ZOLOFT) 50 MG tablet Take 50 mg by mouth every morning. 12/17/20  Yes [provider]  ?tiZANidine (ZANAFLEX) 4 MG tablet Take 4 mg by mouth at bedtime as needed for muscle spasms. 03/31/20  Yes [provider]  ?traZODone (DESYREL) 50 MG tablet Take 1 tablet (50 mg total) by mouth at bedtime. 12/15/16  Yes Burns, Arloa Koh, MD  ?Vitamin D, Ergocalciferol, (DRISDOL) 1.25 MG (50000 UT) CAPS capsule Take 50,000 Units by mouth every Monday. 08/15/19  Yes [provider]  ?   ? ?Allergies    ?Coconut flavor [flavoring agent], Coconut oil, Ibuprofen, Aleve [naproxen], Nsaids, and Vascepa [icosapent ethyl]   ? ?Review of Systems   ?Review of Systems ? ?Physical Exam ?Updated Vital Signs ?BP (!) 85/61   Pulse 70   Temp 98.4 ?F (36.9 ?C)   Resp 20   Ht '5\' 10"'$  (1.778 m)   Wt (!) 149.7 kg   SpO2 95%   BMI 47.35 kg/m?  ? ?Physical Exam ?Vitals and nursing note reviewed.  ?Constitutional:   ?   General: He is not in acute distress. ?   Appearance: He is well-developed. He is not diaphoretic.  ?HENT:  ?   Head: Normocephalic and atraumatic.  ?   Right Ear: Tympanic membrane, ear canal and external ear normal.  ?   Left Ear: Tympanic membrane, ear canal and external ear normal.  ?   Nose: Nose normal.  ?   Mouth/Throat:  ?   Mouth: Mucous membranes are moist. Mucous membranes are not dry.   ?Eyes:  ?   General:     ?   Right eye: No discharge.     ?   Left eye: No discharge.  ?   Conjunctiva/sclera: Conjunctivae normal.  ?   Pupils: Pupils are equal, round, and reactive to light.  ?   Comments: Pupils equal round and reactive  ?Neck:  ?   Vascular: Normal carotid pulses. No carotid bruit or JVD.  ?   Trachea: Trachea normal. No tracheal deviation.  ?Cardiovascular:  ?   Rate and Rhythm: Normal rate and regular rhythm.  ?   Pulses: No decreased pulses.     ?     Radial pulses are 2+ on the right  side and 2+ on the left side.  ?     Dorsalis pedis pulses are 1+ on the right side and 2+ on the left side.  ?   Heart sounds: Normal heart sounds, S1 normal and S2 normal. Heart sounds not distant. No murmur heard. ?   Comments: No murmur or muffled heart sounds. ?Pulmonary:  ?   Effort: Pulmonary effort is normal. No respiratory distress.  ?   Breath sounds: Normal breath sounds. No wheezing.  ?Chest:  ?   Chest wall: No tenderness.  ?Abdominal:  ?   General: Bowel sounds are normal.  ?   Palpations: Abdomen is soft.  ?   Tenderness: There is no abdominal tenderness. There is no guarding or rebound.  ?Musculoskeletal:  ?   Cervical back: Normal range of motion and neck supple. No muscular tenderness.  ?   Right lower leg: No edema.  ?   Left lower leg: No edema.  ?Skin: ?   General: Skin is warm and dry.  ?   Coloration: Skin is not pale.  ?Neurological:  ?   Mental Status: He is alert. Mental status is at baseline.  ?   Motor: No weakness.  ?   Comments: Generalized weakness but symmetric bilaterally in the arms and the legs.  ?Psychiatric:     ?   Mood and Affect: Mood normal.  ? ? ?ED Results / Procedures / Treatments   ?Labs ?(all labs ordered are listed, but only abnormal results are displayed) ?Labs Reviewed  ?CBC - Abnormal; Notable for the following components:  ?    Result Value  ? RBC 3.67 (*)   ? Hemoglobin 11.2 (*)   ? HCT 35.3 (*)   ? All other components within normal limits  ?COMPREHENSIVE  METABOLIC PANEL - Abnormal; Notable for the following components:  ? Sodium 134 (*)   ? CO2 21 (*)   ? Glucose, Bld 273 (*)   ? BUN 30 (*)   ? Creatinine, Ser 1.90 (*)   ? Total Protein 5.9 (*)   ? Albumin 3.3 (*)

## 2022-01-12 NOTE — ED Notes (Signed)
Date and time results received: 01/12/22 0559 ?(use smartphrase ".now" to insert current time) ? ?Test: lactic acid ?Critical Value: 3.9 ? ?Name of Provider Notified: Alecia Lemming, PA ? ?Orders Received? Or Actions Taken?: Actions Taken: n/a ?

## 2022-01-12 NOTE — Progress Notes (Signed)
Patient refusing CPAP.  Patient states he has never wore a CPAP before and will have a sleep study in the future.  Patient prefers oxygen at night.  RN will place patient on 3L.   ?

## 2022-01-12 NOTE — Progress Notes (Signed)
PIV consult placed for 16g per MD. VAS RN messaged primary RN to inquire about request. Chart review indicated he has 2 PIV's that are working (20g, bilateral hand) and he is not receiving any medications through them. Per the RN both flush without issue. VAS RN messaged the provider, Dr. Court Joy to determine if we could hold off on placing additional access as the 2 PIV's he has are suitable based off of labs that have completed, medication list and primary RN assessment. Explained that the 20g would be sufficient for blood transfusions if needed. Dr. Court Joy stated that if rapid infusion is needed that larger bore would be necessary however at this time he's comfortable holding off on additional access. VAS RN to complete consult. Primary RN notified.  ? ?Govani Radloff Lorita Officer, RN ? ?

## 2022-01-12 NOTE — Progress Notes (Signed)
CBG 300 at 2110. Blood sugar checked after eating one strawberry ice cream. Patient will be receiving his night insulin dose at 2200. ?Erling Conte, RN ?

## 2022-01-12 NOTE — ED Provider Notes (Signed)
?Received signout from previous provider, please see his note for complete H&P.  This is a 61 year old male With significant history of OSA not on CPAP, chronic oxygen dependence at 4 L, diabetes, hypertension, chronic CHF, morbid obesity, PE on Eliquis who presents today with evaluation of a possible loss of consciousness.  Patient found himself lying on the ground next to the chair that he normally sleeps on.  Report was feeling quite weak and had to crawl to the table to call for help.  Does not exhibit any infectious symptoms.  He did report an increase in his lisinopril in February.  He was found to be hypotensive during this ER visit improved with IV fluid.  He has a total of 2500 mL.  He was noted to have an AKI which doubles from his usual.  We suspect his AKI may be due to recent medication changes and also due to patient being on chronic opiate and muscle relaxant.  However his pupil is 6 mm and he does not appears to be opiate overdose.  History of PE on Eliquis, low suspicion for new PE ? ?7:37 AM ?Appreciate consultation from internal medicine resident who agrees to see and evaluate patient and will admit for further management of his weakness and hypotension.  Due to improved blood pressure, do not think vasopressure is indicated. ? ?This patient presents to the ED for concern of weakness, this involves an extensive number of treatment options, and is a complaint that carries with it a high risk of complications and morbidity.  The differential diagnosis includes hypovolemia, AKI, sepsis, PE, medication induced hypovolemia, hypoglycemia ? ?Co morbidities that complicate the patient evaluation ?CHF ? DM ? OSA ? PE ?  ?Additional history obtained: ? ?Additional history obtained from EMS ?External records from outside source obtained and reviewed including previous admission notes ? ?Lab Tests: ? ?I Ordered, and personally interpreted labs.  The pertinent results include:  as discussed above ? ?Imaging  Studies ordered: ? ?I ordered imaging studies including head CT ?I independently visualized and interpreted imaging which showed no acute finding ?I agree with the radiologist interpretation ? ?Cardiac Monitoring: ? ?The patient was maintained on a cardiac monitor.  I personally viewed and interpreted the cardiac monitored which showed an underlying rhythm of: NSR ? ?Medicines ordered and prescription drug management: ? ?I ordered medication including IVF  for AKI ?Reevaluation of the patient after these medicines showed that the patient improved ?I have reviewed the patients home medicines and have made adjustments as needed ? ?Test Considered: ?chest CTA, but doubt PE ? ?Critical Interventions: ?IVF ? Broad spectrum abx ? ?Consultations Obtained: ? ?I requested consultation with the internal medicine resident,  and discussed lab and imaging findings as well as pertinent plan - they recommend: admission ? ?Problem List / ED Course: ?hypotension ? Aki ?  ? ?Reevaluation: ? ?After the interventions noted above, I reevaluated the patient and found that they have improved ? ?Social Determinants of Health: ?lives by himself ? ?Dispostion: ? ?After consideration of the diagnostic results and the patients response to treatment, I feel that the patent would benefit from admission. ? ?.Critical Care ?Performed by: Domenic Moras, PA-C ?Authorized by: Domenic Moras, PA-C  ? ?Critical care provider statement:  ?  Critical care time (minutes):  37 ?  Critical care was time spent personally by me on the following activities:  Development of treatment plan with patient or surrogate, discussions with consultants, evaluation of patient's response to treatment, examination of  patient, ordering and review of laboratory studies, ordering and review of radiographic studies, ordering and performing treatments and interventions, pulse oximetry, re-evaluation of patient's condition and review of old charts ? ?BP (!) 109/45   Pulse 71   Temp  98.4 ?F (36.9 ?C)   Resp (!) 21   Ht '5\' 10"'$  (1.778 m)   Wt (!) 149.7 kg   SpO2 96%   BMI 47.35 kg/m?  ? ?Results for orders placed or performed during the hospital encounter of 01/12/22  ?Resp Panel by RT-PCR (Flu A&B, Covid) Nasopharyngeal Swab  ? Specimen: Nasopharyngeal Swab; Nasopharyngeal(NP) swabs in vial transport medium  ?Result Value Ref Range  ? SARS Coronavirus 2 by RT PCR NEGATIVE NEGATIVE  ? Influenza A by PCR NEGATIVE NEGATIVE  ? Influenza B by PCR NEGATIVE NEGATIVE  ?CBC  ?Result Value Ref Range  ? WBC 8.5 4.0 - 10.5 K/uL  ? RBC 3.67 (L) 4.22 - 5.81 MIL/uL  ? Hemoglobin 11.2 (L) 13.0 - 17.0 g/dL  ? HCT 35.3 (L) 39.0 - 52.0 %  ? MCV 96.2 80.0 - 100.0 fL  ? MCH 30.5 26.0 - 34.0 pg  ? MCHC 31.7 30.0 - 36.0 g/dL  ? RDW 13.4 11.5 - 15.5 %  ? Platelets 189 150 - 400 K/uL  ? nRBC 0.0 0.0 - 0.2 %  ?Comprehensive metabolic panel  ?Result Value Ref Range  ? Sodium 134 (L) 135 - 145 mmol/L  ? Potassium 4.3 3.5 - 5.1 mmol/L  ? Chloride 100 98 - 111 mmol/L  ? CO2 21 (L) 22 - 32 mmol/L  ? Glucose, Bld 273 (H) 70 - 99 mg/dL  ? BUN 30 (H) 6 - 20 mg/dL  ? Creatinine, Ser 1.90 (H) 0.61 - 1.24 mg/dL  ? Calcium 9.4 8.9 - 10.3 mg/dL  ? Total Protein 5.9 (L) 6.5 - 8.1 g/dL  ? Albumin 3.3 (L) 3.5 - 5.0 g/dL  ? AST 30 15 - 41 U/L  ? ALT 15 0 - 44 U/L  ? Alkaline Phosphatase 38 38 - 126 U/L  ? Total Bilirubin 1.0 0.3 - 1.2 mg/dL  ? GFR, Estimated 40 (L) >60 mL/min  ? Anion gap 13 5 - 15  ?Lactic acid, plasma  ?Result Value Ref Range  ? Lactic Acid, Venous 3.9 (HH) 0.5 - 1.9 mmol/L  ?Lactic acid, plasma  ?Result Value Ref Range  ? Lactic Acid, Venous 3.5 (HH) 0.5 - 1.9 mmol/L  ?CK  ?Result Value Ref Range  ? Total CK 110 49 - 397 U/L  ?CBG monitoring, ED  ?Result Value Ref Range  ? Glucose-Capillary 275 (H) 70 - 99 mg/dL  ?I-Stat arterial blood gas, [facility] ED)  ?Result Value Ref Range  ? pH, Arterial 7.328 (L) 7.35 - 7.45  ? pCO2 arterial 47.5 32 - 48 mmHg  ? pO2, Arterial 106 83 - 108 mmHg  ? Bicarbonate 25.0 20.0 - 28.0  mmol/L  ? TCO2 26 22 - 32 mmol/L  ? O2 Saturation 98 %  ? Acid-base deficit 1.0 0.0 - 2.0 mmol/L  ? Sodium 138 135 - 145 mmol/L  ? Potassium 4.5 3.5 - 5.1 mmol/L  ? Calcium, Ion 1.27 1.15 - 1.40 mmol/L  ? HCT 32.0 (L) 39.0 - 52.0 %  ? Hemoglobin 10.9 (L) 13.0 - 17.0 g/dL  ? Patient temperature 98.4 F   ? Collection site RADIAL, ALLEN'S TEST ACCEPTABLE   ? Drawn by Operator   ? Sample type ARTERIAL   ?Troponin I (High Sensitivity)  ?  Result Value Ref Range  ? Troponin I (High Sensitivity) 10 <18 ng/L  ?Troponin I (High Sensitivity)  ?Result Value Ref Range  ? Troponin I (High Sensitivity) 9 <18 ng/L  ? ?CT HEAD WO CONTRAST (5MM) ? ?Result Date: 01/12/2022 ?CLINICAL DATA:  Syncope/presyncope EXAM: CT HEAD WITHOUT CONTRAST TECHNIQUE: Contiguous axial images were obtained from the base of the skull through the vertex without intravenous contrast. RADIATION DOSE REDUCTION: This exam was performed according to the departmental dose-optimization program which includes automated exposure control, adjustment of the mA and/or kV according to patient size and/or use of iterative reconstruction technique. COMPARISON:  10/15/2020 FINDINGS: Brain: No evidence of acute infarction, hemorrhage, hydrocephalus, extra-axial collection or mass lesion/mass effect. Vascular: No hyperdense vessel. Atheromatous calcification at the vertebral arteries. Skull: Normal. Negative for fracture or focal lesion. Sinuses/Orbits: No acute finding. IMPRESSION: No acute or interval finding. Electronically Signed   By: Jorje Guild M.D.   On: 01/12/2022 05:04  ? ?DG Chest Port 1 View ? ?Result Date: 01/12/2022 ?CLINICAL DATA:  Syncope EXAM: PORTABLE CHEST 1 VIEW COMPARISON:  11/17/2021 FINDINGS: Normal heart size and mediastinal contours when accounting for low lung volumes. No acute infiltrate or edema. No effusion or pneumothorax. No acute osseous findings. Artifact from EKG leads. IMPRESSION: Stable low volume chest. Electronically Signed   By:  Jorje Guild M.D.   On: 01/12/2022 05:06   ? ? ?  ?Domenic Moras, PA-C ?01/12/22 3646 ? ?  ?Godfrey Pick, MD ?01/12/22 1836 ? ?

## 2022-01-12 NOTE — H&P (Addendum)
?Date: 01/12/2022     ?     ?     ?Patient Name:  [name] MRN: [id number]  ?DOB: Jan 22, 1961 Age / Sex: 61 y.o., male   ?PCP: [name], FNP    ?     ?Medical Service: Internal Medicine Teaching Service    ?     ?Attending Physician: Dr. Heber Hubbard, Rachel Moulds, DO    ?First Contact: Tamsen Snider, MD Pager: Cherly Hensen [phone]  ?     ?     ?After Hours (After 5p/  First Contact Pager: [phone]  ?weekends / holidays): Second Contact Pager: [phone]  ? ?SUBJECTIVE  ? ?Chief Complaint: Weakness ? ?History of Present Illness:[name] is a 61 year old male living with HTN, uncontrolled T2DM on Insulin with complications of vascular disease and peripheral neuropathy, HLD, OSA not on CPAP, chronic 4 L oxygen dependence at night, chronic back pain and hip pain on long term opioids, Provoked PE on Eliquis , and chronic HFpEF who presented with weakness after waking up from sleep on the ground.  Patient reports he was in his normal state of health last night when he went to sleep in his recliner as normal.  He sleeps better in recliner due to chronic back and hip pain.  Before bed he took trazodone and Zanaflex.  He sleeps with 4 L supplemental oxygen but did not wear it last night.  He woke up on the floor and said he was very weak.  He crawled to his phone and called EMS.  He had some loose bowel movements 3 days ago that were streaked with red blood.  He has also been on a fluid restriction for his HFpEF.  His lisinopril was increased in the past month but no other medication changes he can recall.  He is on high-dose of oxycodone, but did not appear to be an opioid overdose to ED providers.  Denies any fever or chills, cough, chest pain, shortness of breath, falls, weakness, or similar episodes.  ? ? ?Meds:  ?Current Meds  ?Medication Sig  ? Accu-Chek FastClix Lancets MISC 1 Device by Other route 2 (two) times daily.   ? ACCU-CHEK GUIDE test strip 1 each by Other route 2 (two) times daily.  ? albuterol (VENTOLIN HFA)  108 (90 Base) MCG/ACT inhaler Inhale 2 puffs into the lungs every 6 (six) hours as needed for wheezing or shortness of breath.  ? apixaban (ELIQUIS) 5 MG TABS tablet Take 2 tablets ('10mg'$ ) twice daily for 7 days, then 1 tablet ('5mg'$ ) twice daily (Patient taking differently: Take 5 mg by mouth 2 (two) times daily.)  ? atorvastatin (LIPITOR) 40 MG tablet Take 40 mg by mouth at bedtime.  ? carvedilol (COREG) 12.5 MG tablet Take 12.5 mg by mouth 2 (two) times daily.  ? cyanocobalamin 1000 MCG tablet Take by mouth.  ? diclofenac (FLECTOR) 1.3 % PTCH Place 1 patch onto the skin 2 (two) times daily as needed (back and knee pain).  ? fenofibrate (TRICOR) 145 MG tablet Take 1 tablet (145 mg total) by mouth daily. (Patient taking differently: Take 145 mg by mouth every morning.)  ? furosemide (LASIX) 40 MG tablet Take 1 tablet (40 mg total) by mouth 3 (three) times daily.  ? gabapentin (NEURONTIN) 800 MG tablet Take 800 mg by mouth 3 (three) times daily.  ? glipiZIDE (GLUCOTROL) 10 MG tablet Take 10 mg by mouth 2 (two) times daily.  ? HUMULIN R U-500 KWIKPEN 500 UNIT/ML  kwikpen Inject 0-110 Units into the skin See admin instructions. Per sliding scale 3 times daily with meals  ? Insulin Pen Needle 31G X 5 MM MISC Use 1 needle daily to inject insulin as prescribed  ? isosorbide mononitrate (IMDUR) 30 MG 24 hr tablet TAKE 1 TABLET BY MOUTH DAILY (Patient taking differently: Take 30 mg by mouth daily.)  ? lisinopril (ZESTRIL) 20 MG tablet Take 1 tablet (20 mg total) by mouth every morning.  ? Menthol, Topical Analgesic, (BIOFREEZE EX) Apply 1 application topically 2 (two) times daily as needed (knee pain).  ? nitroGLYCERIN (NITROSTAT) 0.4 MG SL tablet Place 1 tablet (0.4 mg total) under the tongue every 5 (five) minutes as needed for chest pain.  ? ondansetron (ZOFRAN) 4 MG tablet Take 4 mg by mouth 2 (two) times daily as needed for nausea/vomiting.  ? Oxycodone HCl 20 MG TABS Take 20 mg by mouth 4 (four) times daily as needed  (pain).  ? pantoprazole (PROTONIX) 40 MG tablet Take 40 mg by mouth every morning.  ? potassium chloride (MICRO-K) 10 MEQ CR capsule Take 20 mEq by mouth 2 (two) times daily.  ? sertraline (ZOLOFT) 50 MG tablet Take 50 mg by mouth every morning.  ? tiZANidine (ZANAFLEX) 4 MG tablet Take 4 mg by mouth at bedtime as needed for muscle spasms.  ? traZODone (DESYREL) 50 MG tablet Take 1 tablet (50 mg total) by mouth at bedtime.  ? Vitamin D, Ergocalciferol, (DRISDOL) 1.25 MG (50000 UT) CAPS capsule Take 50,000 Units by mouth every Monday.  ? ? ?Past Medical History:  ?Diagnosis Date  ? Anemia   ? Arthritis   ? Back pain   ? CAD (coronary artery disease)   ? a. s/p DES to LAD in 05/2016  ? Cervical radiculopathy   ? Chronic diastolic CHF (congestive heart failure) ([place])   ? Chronic pain   ? Depression   ? DVT (deep venous thrombosis) ([place])   ? Hematemesis   ? Hepatic steatosis   ? Hyperlipidemia   ? Hypertension   ? IBS (irritable bowel syndrome)   ? Morbid obesity ([place])   ? OSA (obstructive sleep apnea)   ? Pancreatitis   ? PE (pulmonary thromboembolism) ([place])   ? Peripheral neuropathy   ? PUD (peptic ulcer disease)   ? Renal disorder   ? Stroke [facility])   ? a. ?details unclear - not seen on imaging when he was admitted in 05/2017 for TIA symptoms which were felt due to cervical radiculopathy.  ? Thoracic aortic ectasia ([facility])   ? a. 4.3cm ectatic ascending thoracic aorta by CT 06/2017.   ? Type 2 diabetes mellitus ([place])   ? ? ?Past Surgical History:  ?Procedure Laterality Date  ? CARDIAC CATHETERIZATION N/A 05/31/2016  ? Procedure: Left Heart Cath and Coronary Angiography;  Surgeon: Peter M Martinique, MD;  Location: [place] CV LAB;  Service: Cardiovascular;  Laterality: N/A;  ? CARDIAC CATHETERIZATION N/A 05/31/2016  ? Procedure: Intravascular Pressure Wire/FFR Study;  Surgeon: Peter M Martinique, MD;  Location: [place] CV LAB;  Service: Cardiovascular;  Laterality: N/A;  ? CARDIAC CATHETERIZATION N/A 05/31/2016  ? Procedure:  Coronary Stent Intervention;  Surgeon: Peter M Martinique, MD;  Location: [place] CV LAB;  Service: Cardiovascular;  Laterality: N/A;  ? COLONOSCOPY N/A 07/17/2021  ? Procedure: COLONOSCOPY;  Surgeon: Wilford Corner, MD;  Location: [facility] ENDOSCOPY;  Service: Endoscopy;  Laterality: N/A;  ? COLONOSCOPY WITH PROPOFOL N/A 04/29/2020  ? Procedure: COLONOSCOPY WITH PROPOFOL;  Surgeon: Wilford Corner, MD;  Location: [place];  Service: Endoscopy;  Laterality: N/A;  ? ESOPHAGOGASTRODUODENOSCOPY N/A 04/29/2020  ? Procedure: ESOPHAGOGASTRODUODENOSCOPY (EGD);  Surgeon: [place]ford Corner, MD;  Location: [place];  Service: Endoscopy;  Laterality: N/A;  ? ESOPHAGOGASTRODUODENOSCOPY (EGD) WITH PROPOFOL N/A 07/17/2021  ? Procedure: ESOPHAGOGASTRODUODENOSCOPY (EGD) WITH PROPOFOL;  Surgeon: Wilford Corner, MD;  Location: [facility] ENDOSCOPY;  Service: Endoscopy;  Laterality: N/A;  ? LEFT HEART CATH AND CORONARY ANGIOGRAPHY N/A 12/08/2017  ? Procedure: LEFT HEART CATH AND CORONARY ANGIOGRAPHY;  Surgeon: Leonie Man, MD;  Location: [place] CV LAB;  Service: Cardiovascular;  Laterality: N/A;  ? LEFT HEART CATHETERIZATION WITH CORONARY ANGIOGRAM N/A 02/03/2014  ? Procedure: LEFT HEART CATHETERIZATION WITH CORONARY ANGIOGRAM;  Surgeon: Pixie Casino, MD;  Location[facility] CATH LAB;  Service: Cardiovascular;  Laterality: N/A;  ? left leg stent     ? POLYPECTOMY  04/29/2020  ? Procedure: POLYPECTOMY;  Surgeon: [place]ford Corner, MD;  Location: [facility] ENDOSCOPY;  Service: Endoscopy;;  ? POLYPECTOMY  07/17/2021  ? Procedure: POLYPECTOMY;  Surgeon: Wilford Corner, MD;  Location: [facility] ENDOSCOPY;  Service: Endoscopy;;  ? ? ?Social:  ?Lives by himself.  Performs own ADLs and IADLs. His son is his next of kin. ?MWN:UUVOZ, Shawnee Knapp, FNP ?Substances: ?Social History  ? ?Tobacco Use  ? Smoking status: Never  ? Smokeless tobacco: Never  ?Vaping Use  ? Vaping Use: Never used  ?Substance Use Topics  ? Alcohol use: No  ? Drug use: No  ? ? ?Family  History:  ?Family History  ?Problem Relation Age of Onset  ? Cancer Father   ? Hypertension Mother   ? Diabetes Mother   ? Breast cancer Mother   ? Hypertension Brother   ? Diabetes Brother   ? Hypertension Siste

## 2022-01-13 ENCOUNTER — Encounter (HOSPITAL_COMMUNITY): Payer: Self-pay | Admitting: Internal Medicine

## 2022-01-13 DIAGNOSIS — N179 Acute kidney failure, unspecified: Secondary | ICD-10-CM | POA: Diagnosis not present

## 2022-01-13 LAB — CBC WITH DIFFERENTIAL/PLATELET
Abs Immature Granulocytes: 0.02 10*3/uL (ref 0.00–0.07)
Basophils Absolute: 0.1 10*3/uL (ref 0.0–0.1)
Basophils Relative: 1 %
Eosinophils Absolute: 0.2 10*3/uL (ref 0.0–0.5)
Eosinophils Relative: 2 %
HCT: 36.4 % — ABNORMAL LOW (ref 39.0–52.0)
Hemoglobin: 12.2 g/dL — ABNORMAL LOW (ref 13.0–17.0)
Immature Granulocytes: 0 %
Lymphocytes Relative: 27 %
Lymphs Abs: 2.3 10*3/uL (ref 0.7–4.0)
MCH: 31 pg (ref 26.0–34.0)
MCHC: 33.5 g/dL (ref 30.0–36.0)
MCV: 92.6 fL (ref 80.0–100.0)
Monocytes Absolute: 0.7 10*3/uL (ref 0.1–1.0)
Monocytes Relative: 8 %
Neutro Abs: 5.3 10*3/uL (ref 1.7–7.7)
Neutrophils Relative %: 62 %
Platelets: 199 10*3/uL (ref 150–400)
RBC: 3.93 MIL/uL — ABNORMAL LOW (ref 4.22–5.81)
RDW: 13.2 % (ref 11.5–15.5)
WBC: 8.6 10*3/uL (ref 4.0–10.5)
nRBC: 0 % (ref 0.0–0.2)

## 2022-01-13 LAB — BASIC METABOLIC PANEL
Anion gap: 10 (ref 5–15)
BUN: 27 mg/dL — ABNORMAL HIGH (ref 6–20)
CO2: 24 mmol/L (ref 22–32)
Calcium: 9.8 mg/dL (ref 8.9–10.3)
Chloride: 102 mmol/L (ref 98–111)
Creatinine, Ser: 1.81 mg/dL — ABNORMAL HIGH (ref 0.61–1.24)
GFR, Estimated: 43 mL/min — ABNORMAL LOW (ref >60)
Glucose, Bld: 281 mg/dL — ABNORMAL HIGH (ref 70–99)
Potassium: 4.3 mmol/L (ref 3.5–5.1)
Sodium: 136 mmol/L (ref 135–145)

## 2022-01-13 LAB — MAGNESIUM: Magnesium: 2 mg/dL (ref 1.7–2.4)

## 2022-01-13 LAB — GLUCOSE, CAPILLARY
Glucose-Capillary: 251 mg/dL — ABNORMAL HIGH (ref 70–99)
Glucose-Capillary: 264 mg/dL — ABNORMAL HIGH (ref 70–99)
Glucose-Capillary: 251 mg/dL — ABNORMAL HIGH (ref 70–99)

## 2022-01-13 MED ORDER — ONDANSETRON HCL 4 MG/2ML IJ SOLN
4.0000 mg | Freq: Once | INTRAMUSCULAR | Status: AC
  Administered 2022-01-13: 4 mg via INTRAVENOUS
  Filled 2022-01-13: qty 2

## 2022-01-13 MED ORDER — ORAL CARE MOUTH RINSE
15.0000 mL | Freq: Two times a day (BID) | OROMUCOSAL | Status: DC
  Administered 2022-01-13: 15 mL via OROMUCOSAL

## 2022-01-13 MED ORDER — INSULIN REGULAR HUMAN (CONC) 500 UNIT/ML ~~LOC~~ SOPN
25.0000 [IU] | PEN_INJECTOR | Freq: Three times a day (TID) | SUBCUTANEOUS | Status: DC
  Administered 2022-01-13: 25 [IU] via SUBCUTANEOUS
  Filled 2022-01-13: qty 3

## 2022-01-13 MED ORDER — APIXABAN 5 MG PO TABS
5.0000 mg | ORAL_TABLET | Freq: Two times a day (BID) | ORAL | Status: DC
  Administered 2022-01-13: 5 mg via ORAL
  Filled 2022-01-13: qty 1

## 2022-01-13 MED ORDER — OXYCODONE HCL 5 MG PO TABS
20.0000 mg | ORAL_TABLET | Freq: Four times a day (QID) | ORAL | Status: DC | PRN
  Administered 2022-01-13 (×2): 20 mg via ORAL
  Filled 2022-01-13 (×2): qty 4

## 2022-01-13 MED ORDER — CARVEDILOL 12.5 MG PO TABS
12.5000 mg | ORAL_TABLET | Freq: Two times a day (BID) | ORAL | Status: DC
  Administered 2022-01-13: 12.5 mg via ORAL
  Filled 2022-01-13: qty 1

## 2022-01-13 MED ORDER — ISOSORBIDE MONONITRATE ER 30 MG PO TB24
30.0000 mg | ORAL_TABLET | Freq: Every day | ORAL | Status: DC
  Administered 2022-01-13: 30 mg via ORAL
  Filled 2022-01-13: qty 1

## 2022-01-13 NOTE — Plan of Care (Signed)

## 2022-01-13 NOTE — Progress Notes (Signed)
DISCHARGE NOTE HOME ?[name] to be discharged Home per MD order. Discussed prescriptions and follow up appointments with the patient. Prescriptions given to patient; medication list explained in detail. Patient verbalized understanding. ? ?Skin clean, dry and intact without evidence of skin break down, no evidence of skin tears noted. IV catheter discontinued intact. Site without signs and symptoms of complications. Dressing and pressure applied. Pt denies pain at the site currently. No complaints noted. ? ?Patient free of lines, drains, and wounds.  ? ?An After Visit Summary (AVS) was printed and given to the patient. ?Patient escorted via wheelchair, and discharged home via private auto. ? ?Vira Agar, RN  ?

## 2022-01-13 NOTE — Progress Notes (Signed)
Patient complained of new onset nausea, vomiting and dizziness.  ?Sitting BP 123/77.  MD paged. ?

## 2022-01-13 NOTE — TOC Transition Note (Signed)
Transition of Care (TOC) - CM/SW Discharge Note ? ? ?Patient Details  ?Name: [name] ?MRN: [id number] ?Date of Birth: 08-22-1961 ? ?Transition of Care (TOC) CM/SW Contact:  ?Tom-Johnson, Renea Ee, RN ?Phone Number: ?01/13/2022, 12:56 PM ? ? ?Clinical Narrative:    ? ?Patient is scheduled for discharge today. Admitted for AKI. From home alone. Has five children. Parents are deceased,has eight siblings. States he has good family support. Not currently employed. Gets SSI monthly. Independent and drive self prior t admission. As a cane and a walker at home.  ?PCP is [name], FNP and uses [facility] on [address]. ?Patient was going for outpatient PT at [facility] on [address] prior admission. PT recommending patient continue outpatient PT. Order placed and on AVS.  ?Patient states his family will transport him at discharge. No further TOC needs noted. ? ?  ?  ? ? ?Patient Goals and CMS Choice ?  ?  ?  ? ?Discharge Placement ?  ?           ?  ?  ?  ?  ? ?Discharge Plan and Services ?  ?  ?           ?  ?  ?  ?  ?  ?  ?  ?  ?  ?  ? ?Social Determinants of Health (SDOH) Interventions ?  ? ? ?Readmission Risk Interventions ? ?  06/29/2021  ? 11:43 AM  ?Readmission Risk Prevention Plan  ?Transportation Screening Complete  ?Medication Review Press photographer) Complete  ?PCP or Specialist appointment within 3-5 days of discharge Complete  ?[place] or Home Care Consult Complete  ?SW Recovery Care/Counseling Consult Complete  ?Palliative Care Screening Not Applicable  ?[place] Not Applicable  ? ? ? ? ? ?

## 2022-01-13 NOTE — Progress Notes (Signed)
PT Note ? ?Pt seen and evaluated. Full note to follow. Pt can return home and resume his OPPT that he just initiated on 01/10/2022 at our [facility] location. Told pt to call them when he returns home to let them know he was in hospital.  ? ?[facility] PT ?Acute Rehabilitation Services ?Pager [phone] ?Office [phone] ? ?

## 2022-01-13 NOTE — Progress Notes (Signed)
MD went to the bedside to evaluate patient prior to discharge due to nausea and dry heaving as well as dizziness earlier during discharge. Patient reports he did fine walking in the hall with PT. He became slightly dizzy and nauseous while sitting down after using the bathroom.  Patient's BP was checked during the episode and remained stable at 123/77.  Patient reports the nausea has resolved after dose of IV Zofran.  Patient was found eating during the re-evaluation.  He denies any dizziness, chest pain, nausea, vomiting, abdominal pain or shortness of breath.  Patient reports he has an appointment with his PCP tomorrow at 10:30 AM and has some Zofran at home as needed for nausea. He reports having occasional nausea at home likely due to his IBS. Patient  had a bowel movement today.  I asked RN to repeat patient vitals and continue with plan for discharge today. Patient reports he will call a family member to come pick him up. ?

## 2022-01-13 NOTE — Progress Notes (Signed)
Inpatient Diabetes Program Recommendations ? ?AACE/ADA: New Consensus Statement on Inpatient Glycemic Control (2015) ? ?Target Ranges:  Prepandial:   less than 140 mg/dL ?     Peak postprandial:   less than 180 mg/dL (1-2 hours) ?     Critically ill patients:  140 - 180 mg/dL  ? ?Lab Results  ?Component Value Date  ? GLUCAP 251 (H) 01/13/2022  ? HGBA1C 9.3 (H) 01/12/2022  ? ? ?Review of Glycemic Control ? Latest Reference Range & Units 01/12/22 04:23 01/12/22 14:12 01/12/22 18:06 01/12/22 21:10 01/13/22 07:22  ?Glucose-Capillary 70 - 99 mg/dL 275 (H) 156 (H) 173 (H) 300 (H) 251 (H)  ? ?Diabetes history: DM 2 ?Outpatient Diabetes medications:  ?Glipizide 10 mg bid ?U500 insulin 8am 85 units +5 for every 20-50 units>130 mg/dL ?          2 pm 90 units ?                    10 pm 75 units ?Current orders for Inpatient glycemic control:  ?Novolog 0-20 units tid + hs scale ?Humulin R U-500 20 units tid ? ?Inpatient Diabetes Program Recommendations:   ? ?-   Consider increasing - U500 to 25 units tid  (can be titrated in 5 unit increments) ? ?Thanks,  ?Tama Headings RN, MSN, BC-ADM ?Inpatient Diabetes Coordinator ?Team Pager [phone] (8a-5p) ? ? ? ?

## 2022-01-13 NOTE — Discharge Summary (Signed)
? ?Name: [name] ?MRN: [id number] ?DOB: 1961/10/15 61 y.o. ?PCP: [name], FNP ? ?Date of Admission: 01/12/2022  3:50 AM ?Date of Discharge:  01/13/2022 ?Attending Physician: Dr. Angelia Mould ? ?DISCHARGE DIAGNOSIS:  ?Primary Problem: Acute kidney injury ([place])  ? ?Hospital Problems: ?Principal Problem: ?  Acute kidney injury ([place]) ?Active Problems: ?  Obesity, Class III, BMI 40-49.9 (morbid obesity) ([place]) ?  HTN (hypertension) ?  Type 2 diabetes mellitus with vascular disease ([place]) ?  Chronic diastolic CHF (congestive heart failure) ([place]) ?  ? ?DISCHARGE MEDICATIONS:  ? ?Allergies as of 01/13/2022   ? ?   Reactions  ? Coconut Flavor [flavoring Agent] Hives  ? Coconut Oil Hives  ? Ibuprofen Other (See Comments)  ? Made gastric ulcers worse  ? Aleve [naproxen] Other (See Comments)  ? DUE TO KIDNEYS  ? Nsaids Other (See Comments)  ? Stomach ulcers  ? Other Hives  ? Nut Allergy  ? Vascepa [icosapent Ethyl] Other (See Comments)  ? headaches, chest pain, similar to sx of a stroke, hypotension   ? ?  ? ?  ?Medication List  ?  ? ?STOP taking these medications   ? ?lisinopril 20 MG tablet ?Commonly known as: ZESTRIL ?  ? ?  ? ?TAKE these medications   ? ?Accu-Chek FastClix Lancets Misc ?1 Device by Other route 2 (two) times daily. ?  ?Accu-Chek Guide test strip ?Generic drug: glucose blood ?1 each by Other route 2 (two) times daily. ?  ?albuterol 108 (90 Base) MCG/ACT inhaler ?Commonly known as: VENTOLIN HFA ?Inhale 2 puffs into the lungs every 6 (six) hours as needed for wheezing or shortness of breath. ?  ?apixaban 5 MG Tabs tablet ?Commonly known as: Eliquis ?Take 2 tablets ('10mg'$ ) twice daily for 7 days, then 1 tablet ('5mg'$ ) twice daily ?What changed:  ?how much to take ?how to take this ?when to take this ?additional instructions ?  ?atorvastatin 40 MG tablet ?Commonly known as: LIPITOR ?Take 40 mg by mouth at bedtime. ?  ?BIOFREEZE EX ?Apply 1 application topically 2 (two) times daily as needed (knee pain). ?   ?carvedilol 12.5 MG tablet ?Commonly known as: COREG ?Take 12.5 mg by mouth 2 (two) times daily. ?  ?cyanocobalamin 1000 MCG tablet ?Take by mouth. ?  ?diclofenac 1.3 % Ptch ?Commonly known as: FLECTOR ?Place 1 patch onto the skin 2 (two) times daily as needed (back and knee pain). ?  ?fenofibrate 145 MG tablet ?Commonly known as: TRICOR ?Take 1 tablet (145 mg total) by mouth daily. ?What changed: when to take this ?  ?furosemide 40 MG tablet ?Commonly known as: Lasix ?Take 1 tablet (40 mg total) by mouth 3 (three) times daily. ?  ?gabapentin 800 MG tablet ?Commonly known as: NEURONTIN ?Take 800 mg by mouth 3 (three) times daily. ?  ?glipiZIDE 10 MG tablet ?Commonly known as: GLUCOTROL ?Take 10 mg by mouth 2 (two) times daily. ?  ?HumuLIN R U-500 KwikPen 500 UNIT/ML KwikPen ?Generic drug: insulin regular human CONCENTRATED ?Inject 0-110 Units into the skin See admin instructions. Per sliding scale 3 times daily with meals ?  ?Insulin Pen Needle 31G X 5 MM Misc ?Use 1 needle daily to inject insulin as prescribed ?  ?isosorbide mononitrate 30 MG 24 hr tablet ?Commonly known as: IMDUR ?TAKE 1 TABLET BY MOUTH DAILY ?  ?nitroGLYCERIN 0.4 MG SL tablet ?Commonly known as: NITROSTAT ?Place 1 tablet (0.4 mg total) under the tongue every 5 (five) minutes as needed for  chest pain. ?  ?ondansetron 4 MG tablet ?Commonly known as: ZOFRAN ?Take 4 mg by mouth 2 (two) times daily as needed for nausea/vomiting. ?  ?Oxycodone HCl 20 MG Tabs ?Take 20 mg by mouth 4 (four) times daily as needed (pain). ?  ?pantoprazole 40 MG tablet ?Commonly known as: PROTONIX ?Take 40 mg by mouth every morning. ?  ?potassium chloride 10 MEQ CR capsule ?Commonly known as: MICRO-K ?Take 20 mEq by mouth 2 (two) times daily. ?  ?sertraline 50 MG tablet ?Commonly known as: ZOLOFT ?Take 50 mg by mouth every morning. ?  ?tiZANidine 4 MG tablet ?Commonly known as: ZANAFLEX ?Take 4 mg by mouth at bedtime as needed for muscle spasms. ?  ?traZODone 50 MG  tablet ?Commonly known as: DESYREL ?Take 1 tablet (50 mg total) by mouth at bedtime. ?  ?Vitamin D (Ergocalciferol) 1.25 MG (50000 UNIT) Caps capsule ?Commonly known as: DRISDOL ?Take 50,000 Units by mouth every Monday. ?  ? ?  ? ? ?DISPOSITION AND FOLLOW-UP:  ?[name] was discharged from [facility] in Stable condition. At the hospital follow up visit please address: ? ?Acute encephalopathy, resolved ?AKI ?History of hypertension ?Counseled patient on being conservative with pain regimen while kidney function is still impaired, and to make sure to take in adequate fluids. Recheck BMP recommended prior to restarting lisinopril as this was held at admission. ?  ?Hematochezia ?Monitor for recurrent episodes of hematochezia. Patient may need additional follow-up with GI if so. Recheck CBC.  ?  ?T2DM with hyperglycemia, uncontrolled, complications of vascular disease and peripheral neuropathy.  ?Monitor glucose measurements and adjust regimen as indicated. ?  ?Hx of HTN ?Hypotensive on admission with appropriate response to 2.5L IVF resuscitation and holding of home antihypertensives. Imdur and coreg were restarted prior to discharge but lasix and lisinopril were held. ?  ?OSA, not on CPAP ?Refer for sleep study. ? ?Follow-up Recommendations: ?Consults: None ?Labs: Basic Metabolic Profile, Blood Sugar, CBC, and Hgb A1C (in 3 months) ?Studies: Sleep study ?Medications:  ? ?STOP taking these medications: ?Lisinopril 20 mg daily until AKI has resolved. ?No other medication changes made at discharge. ? ?Follow-up Appointments: ? Follow-up Information   ? ? [name], FNP. Schedule an appointment as soon as possible for a visit in 1 week(s).   ?Specialty: Family Medicine ?Why: Call and schedule an appointment with your PCP for early next week to recheck your kidney function. ?Contact information: ?[address] ?[place] [address] ?[phone] ? ? ?  ?  ? ? Lelon Perla, MD .    ?Specialty: Cardiology ?Contact information: ?[address] ?[place] [address] ?[phone] ? ? ?  ?  ? ?  ?  ? ?  ? ? ?HOSPITAL COURSE:  ?Patient Summary: ?#Acute encephalopathy, resolved ?On presentation patient complained of weakness and syncope with loose BM in the few days preceding the event and new increased dose of Lisinopril over the last month. He was hypotensive and encephalopathic which showed gradual improvement with IFV resuscitation. Antihypertensives were held. He had improvement overnight to normotensive, at times hypertensive, state and had resolution of encephalopathy. ?  ?#AKI ?Admission labs revealed AKI with serum creatinine of 1.9. He received 2.5L IVF with repeat serum creatinine of 2.19. Overnight this improved to 1.81. Renal ultrasound did not show abnormalities and urinalysis was remarkable for glucose >500, small hemoglobin with 0-5 RBC, rare bacteria. Home lisinopril was held. ?  ?#Hematochezia ?Patient had BM with  red discoloration in toilet bowl; of note he did endorse blood-streaked BM prior to presenting to the hospital. He declined rectal exam. Hemoglobin at admission was 11.2, which remained stable and overall improved prior to discharge with hemoglobin of 12.2. Home Eliquis was held at admission but resumed prior to discharge. He was hemodynamically stable throughout admission. ?  ?#T2DM with hyperglycemia, uncontrolled, complications of vascular disease and peripheral neuropathy.  ?HbA1c 9.3%. Managed with novoLOG SSI with meals and nighttime correction, as well as Humulin 4 20 units TID with meals.  ?  ?#Hx of HTN ?Hypotensive on admission with appropriate response to 2.5L IVF resuscitation and holding of home antihypertensives. Imdur and coreg were restarted prior to discharge but lasix and lisinopril were held. ?  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 Home Eliquis was held at admission given hematochezia but was restarted prior to discharge. ?  ?#HFpEF ?Chronic. Home  Lasix held due to hypovolemic state. ?  ?#Chronic hip and back pain ?Chronic. Home pain regimen held given encephalopathy on presentation to ED and AKI. ?  ?#OSA, not on CPAP ?CPAP offered for nightly use.  ?

## 2022-01-13 NOTE — Evaluation (Signed)
Physical Therapy Evaluation ?Patient Details ?Name: [name] ?MRN: [id number] ?DOB: 1961/08/23 ?Today's Date: 01/13/2022 ? ?History of Present Illness ? Pt adm 3/22 with acute encephalopathy and AKI. PMH: DM, HTN, peripheral neuropathy, OSA, chronic hip and back pain, PE, chf, obesity, CAD, DVT.  ?Clinical Impression ? Pt doing well with mobility and no further PT needed.  Ready for dc from PT standpoint. ?   ?   ? ?Recommendations for follow up therapy are one component of a multi-disciplinary discharge planning process, led by the attending physician.  Recommendations may be updated based on patient status, additional functional criteria and insurance authorization. ? ?Follow Up Recommendations Outpatient PT (Resume his OPPT for his hip and back pain that he started on 3/20) ? ?  ?Assistance Recommended at Discharge None  ?Patient can return home with the following ?   ? ?  ?Equipment Recommendations None recommended by PT  ?Recommendations for Other Services ?    ?  ?Functional Status Assessment Patient has not had a recent decline in their functional status  ? ?  ?Precautions / Restrictions Precautions ?Precautions: None  ? ?  ? ?Mobility ? Bed Mobility ?  ?  ?  ?  ?  ?  ?  ?General bed mobility comments: Pt up in chair ?  ? ?Transfers ?Overall transfer level: Modified independent ?Equipment used: None ?  ?  ?  ?  ?  ?  ?  ?  ?  ? ?Ambulation/Gait ?Ambulation/Gait assistance: Modified independent (Device/Increase time) ?Gait Distance (Feet): 500 Feet ?Assistive device: Rollator (4 wheels), None ?Gait Pattern/deviations: Step-through pattern ?Gait velocity: adequate ?Gait velocity interpretation: >2.62 ft/sec, indicative of community ambulatory ?  ?General Gait Details: Steady gait with and without assistive device ? ?Stairs ?  ?  ?  ?  ?  ? ?Wheelchair Mobility ?  ? ?Modified Rankin (Stroke Patients Only) ?  ? ?  ? ?Balance Overall balance assessment: Mild deficits observed, not formally tested ?  ?  ?  ?   ?  ?  ?  ?  ?  ?  ?  ?  ?  ?  ?  ?  ?  ?  ?   ? ? ? ?Pertinent Vitals/Pain    ? ? ?Home Living Family/patient expects to be discharged to:: Private residence ?Living Arrangements: Alone ?  ?Type of Home: Apartment ?Home Access: Level entry ?  ?  ?  ?Home Layout: One level ?Home Equipment: Cane - single point;Rollator (4 wheels) ?   ?  ?Prior Function Prior Level of Function : Independent/Modified Independent;Driving ?  ?  ?  ?  ?  ?  ?Mobility Comments: Occasional use of rollator but primarily no assistive device ?  ?  ? ? ?Hand Dominance  ? Dominant Hand: Right ? ?  ?Extremity/Trunk Assessment  ? Upper Extremity Assessment ?Upper Extremity Assessment: Overall WFL for tasks assessed ?  ? ?Lower Extremity Assessment ?Lower Extremity Assessment: Overall WFL for tasks assessed ?  ? ?   ?Communication  ? Communication: No difficulties  ?Cognition Arousal/Alertness: Awake/alert ?Behavior During Therapy: [facility] for tasks assessed/performed ?Overall Cognitive Status: History of cognitive impairments - at baseline ?  ?  ?  ?  ?  ?  ?  ?  ?  ?  ?  ?  ?  ?  ?  ?  ?  ?  ?  ? ?  ?General Comments   ? ?  ?Exercises    ? ?Assessment/Plan  ?  ?  PT Assessment All further PT needs can be met in the next venue of care  ?PT Problem List   ? ?   ?  ?PT Treatment Interventions     ? ?PT Goals (Current goals can be found in the Care Plan section)  ?Acute Rehab PT Goals ?PT Goal Formulation: All assessment and education complete, DC therapy ? ?  ?Frequency   ?  ? ? ?Co-evaluation   ?  ?  ?  ?  ? ? ?  ?AM-PAC PT "6 Clicks" Mobility  ?Outcome Measure Help needed turning from your back to your side while in a flat bed without using bedrails?: None ?Help needed moving from lying on your back to sitting on the side of a flat bed without using bedrails?: None ?Help needed moving to and from a bed to a chair (including a wheelchair)?: None ?Help needed standing up from a chair using your arms (e.g., wheelchair or bedside chair)?: None ?Help needed  to walk in hospital room?: None ?Help needed climbing 3-5 steps with a railing? : None ?6 Click Score: 24 ? ?  ?End of Session   ?Activity Tolerance: Patient tolerated treatment well ?Patient left: in chair;with call bell/phone within reach ?Nurse Communication: Mobility status ?PT Visit Diagnosis: Other abnormalities of gait and mobility (R26.89) ?  ? ?Time: 1210-1223 ?PT Time Calculation (min) (ACUTE ONLY): 13 min ? ? ?Charges:   PT Evaluation ?$PT Eval Low Complexity: 1 Low ?  ?  ?   ? ? ?[facility] PT ?Acute Rehabilitation Services ?Pager [phone] ?Office [phone] ? ? ?[name] ?01/13/2022, 1:33 PM ? ?

## 2022-01-14 ENCOUNTER — Encounter: Payer: Self-pay | Admitting: Pulmonary Disease

## 2022-01-17 LAB — CULTURE, BLOOD (ROUTINE X 2)
Culture: NO GROWTH
Culture: NO GROWTH
Special Requests: ADEQUATE

## 2022-01-18 ENCOUNTER — Ambulatory Visit: Payer: Medicaid Other

## 2022-01-20 ENCOUNTER — Ambulatory Visit: Payer: Medicaid Other | Admitting: Physical Therapy

## 2022-01-20 ENCOUNTER — Encounter: Payer: Self-pay | Admitting: Physical Therapy

## 2022-01-20 DIAGNOSIS — M5441 Lumbago with sciatica, right side: Secondary | ICD-10-CM | POA: Diagnosis not present

## 2022-01-20 DIAGNOSIS — R2689 Other abnormalities of gait and mobility: Secondary | ICD-10-CM

## 2022-01-20 DIAGNOSIS — R293 Abnormal posture: Secondary | ICD-10-CM

## 2022-01-20 DIAGNOSIS — M6281 Muscle weakness (generalized): Secondary | ICD-10-CM

## 2022-01-20 NOTE — Therapy (Addendum)
[place] EVALUATION/DISCHARGE   Patient Name: [name] MRN: [id number] DOB:Nov 26, 1960, 61 y.o., male Today's Date: 01/20/2022   PT End of Session - 01/20/22 1236     Visit Number 2    Date for PT Re-Evaluation 02/21/22    Authorization Type [employer]    Authorization Time Period tbd    Progress Note Due on Visit 10    PT Start Time 1234    PT Stop Time 1314    PT Time Calculation (min) 40 min    Activity Tolerance Patient tolerated treatment well             Past Medical History:  Diagnosis Date   Anemia    Arthritis    Back pain    CAD (coronary artery disease)    a. s/p DES to LAD in 05/2016   Cervical radiculopathy    Chronic diastolic CHF (congestive heart failure) (HCC)    Chronic pain    Depression    DVT (deep venous thrombosis) (HCC)    Hematemesis    Hepatic steatosis    Hyperlipidemia    Hypertension    IBS (irritable bowel syndrome)    Morbid obesity (HCC)    OSA (obstructive sleep apnea)    Pancreatitis    PE (pulmonary thromboembolism) ([place])    Peripheral neuropathy    PUD (peptic ulcer disease)    Renal disorder    Stroke [facility])    a. ?details unclear - not seen on imaging when he was admitted in 05/2017 for TIA symptoms which were felt due to cervical radiculopathy.   Thoracic aortic ectasia (HCC)    a. 4.3cm ectatic ascending thoracic aorta by CT 06/2017.    Type 2 diabetes mellitus ([place])    Past Surgical History:  Procedure Laterality Date   CARDIAC CATHETERIZATION N/A 05/31/2016   Procedure: Left Heart Cath and Coronary Angiography;  Surgeon: Peter M Martinique, MD;  Location: [place] [facility];  Service: Cardiovascular;  Laterality: N/A;   CARDIAC CATHETERIZATION N/A 05/31/2016   Procedure: Intravascular Pressure Wire/FFR Study;  Surgeon: Peter M Martinique, MD;  Location: [place] [facility];  Service: Cardiovascular;  Laterality: N/A;   CARDIAC CATHETERIZATION N/A 05/31/2016   Procedure: Coronary Stent  Intervention;  Surgeon: Peter M Martinique, MD;  Location: [place] [facility];  Service: Cardiovascular;  Laterality: N/A;   COLONOSCOPY N/A 07/17/2021   Procedure: COLONOSCOPY;  Surgeon: Wilford Corner, MD;  Location: [facility];  Service: Endoscopy;  Laterality: N/A;   COLONOSCOPY WITH PROPOFOL N/A 04/29/2020   Procedure: COLONOSCOPY WITH PROPOFOL;  Surgeon: Wilford Corner, MD;  Location: [place];  Service: Endoscopy;  Laterality: N/A;   ESOPHAGOGASTRODUODENOSCOPY N/A 04/29/2020   Procedure: ESOPHAGOGASTRODUODENOSCOPY (EGD);  Surgeon: Wilford Corner, MD;  Location: [facility];  Service: Endoscopy;  Laterality: N/A;   ESOPHAGOGASTRODUODENOSCOPY (EGD) WITH PROPOFOL N/A 07/17/2021   Procedure: ESOPHAGOGASTRODUODENOSCOPY (EGD) WITH PROPOFOL;  Surgeon: Wilford Corner, MD;  Location: [facility];  Service: Endoscopy;  Laterality: N/A;   LEFT HEART CATH AND CORONARY ANGIOGRAPHY N/A 12/08/2017   Procedure: LEFT HEART CATH AND CORONARY ANGIOGRAPHY;  Surgeon: Leonie Man, MD;  Location: [facility];  Service: Cardiovascular;  Laterality: N/A;   LEFT HEART CATHETERIZATION WITH CORONARY ANGIOGRAM N/A 02/03/2014   Procedure: LEFT HEART CATHETERIZATION WITH CORONARY ANGIOGRAM;  Surgeon: Pixie Casino, MD;  Location: [facility] CATH LAB;  Service: Cardiovascular;  Laterality: N/A;   left leg stent      POLYPECTOMY  04/29/2020  Procedure: POLYPECTOMY;  Surgeon: Wilford Corner, MD;  Location: [facility] ENDOSCOPY;  Service: Endoscopy;;   POLYPECTOMY  07/17/2021   Procedure: POLYPECTOMY;  Surgeon: Wilford Corner, MD;  Location: [facility];  Service: Endoscopy;;   Patient Active Problem List   Diagnosis Date Noted   Acute kidney injury ([place]) 01/12/2022   GI bleed 07/15/2021   Pulmonary embolism ([place]) 07/15/2021   Acute pulmonary embolism ([place]) 06/26/2021   Diabetic ulcer of lower leg ([place]) 06/26/2021   Acute respiratory failure with hypoxia ([place]) 06/26/2021   COVID-19 virus infection  06/26/2021   Lumbar radiculitis 03/30/2021   Morbid obesity ([place]) 01/15/2021   Personal history of colonic polyps 01/15/2021   Rectal bleeding 01/15/2021   Pain due to onychomycosis of toenails of both feet 01/15/2021   Epistaxis, recurrent 10/21/2020   Laceration of nose 10/21/2020   Syncope 10/15/2020   OSA (obstructive sleep apnea)    Bilateral carotid artery stenosis 07/28/2020   Bilateral lower extremity edema 07/28/2020   CHF (congestive heart failure) ([place]) 07/28/2020   DDD (degenerative disc disease), lumbar 07/28/2020   Diabetic peripheral neuropathy ([place]) 07/28/2020   Fatty liver 07/28/2020   Irritable bowel syndrome with diarrhea 07/28/2020   Osteoarthritis of right hip 07/28/2020   Upper GI bleed 04/28/2020   Hypertensive urgency 04/27/2020   Acute GI bleeding 04/27/2020   Mixed diabetic hyperlipidemia associated with type 2 diabetes mellitus ([place]) 04/27/2020   Rotator cuff arthropathy 10/30/2019   Cervical radiculopathy 09/24/2019   Trochanteric bursitis of right hip 09/24/2019   Chronic diastolic CHF (congestive heart failure) ([place]) 08/23/2019   ARF (acute renal failure) ([place]) 08/01/2019   Hyperkalemia 08/01/2019   Uncontrolled type 2 diabetes mellitus with hyperglycemia ([place]) 08/01/2019   AKI (acute kidney injury) ([place])    Hypotension 07/31/2019   DKA (diabetic ketoacidosis) ([place]) 05/05/2019   Blurred vision 05/05/2019   Trochanteric bursitis 02/11/2019   Long-term insulin use ([place]) 01/25/2019   Alteration consciousness 11/19/2018   Neck pain 10/09/2018   Left arm weakness 10/09/2018   B12 deficiency 08/10/2017   Persistent headaches 08/09/2017   GERD (gastroesophageal reflux disease) 06/29/2017   Left arm numbness    Cerebral embolism with cerebral infarction 06/17/2017   TIA (transient ischemic attack) 06/17/2017   Chronic back pain 12/29/2016   Depression 12/15/2016   Vitamin D deficiency 12/09/2016   Right hip pain 12/07/2016   Hypokalemia 12/07/2016    Type 2 diabetes mellitus with vascular disease ([place]) 05/31/2016   Normocytic normochromic anemia 05/31/2016   Chest pain 05/31/2016   Coronary artery disease involving native coronary artery of native heart without angina pectoris 01/06/2016   Lactic acidosis 05/28/2014   Nonspecific chest pain 01/29/2014   Uncontrolled secondary diabetes with peripheral neuropathy ([place]) 01/29/2014   Obesity, Class III, BMI 40-49.9 (morbid obesity) ([place]) 01/29/2014   Snoring 01/29/2014   Dyslipidemia 01/29/2014   HTN (hypertension) 01/29/2014   Abnormal nuclear stress test 01/29/2014    PCP: [name], FNP  REFERRING PROVIDER: [name], FNP  REFERRING DIAG: back pain, wrist pain  THERAPY DIAG:  Muscle weakness (generalized)  Other abnormalities of gait and mobility  Abnormal posture  ONSET DATE: 2 years ago  SUBJECTIVE:  SUBJECTIVE STATEMENT: I was having back pain earlier. I took pain medicine and now pain is 5/10. I was in the hospital for a couple of days. My BP dropped after MD changed my meds and I was admitted. I am better now. I am having some bleeding on my left shin. It is not a new condition.    PERTINENT HISTORY:  Carpal tunnel surgery on Left wrist Heart attack and strokes back in 2018  PAIN:  Are you having pain? Yes:  NRPS: 5/10 At worst: 10/10 at rest and walking Pain location: R low side Pain description: aching, dull, sometimes sharp Aggravating factors: walking and rest Relieving factors: pain meds  PRECAUTIONS: None  WEIGHT BEARING RESTRICTIONS No  FALLS:  Has patient fallen in last 6 months? Yes, Number of falls: 2 when doing sit to stands  LIVING ENVIRONMENT: Lives with: lives alone Lives in: House/apartment Stairs: No Has following equipment at home:  Single point cane and Walker - 4 wheeled when I need it  OCCUPATION: disabled  PLOF: Independent  PATIENT GOALS To alleviate pain.   OBJECTIVE:   DIAGNOSTIC FINDINGS:  Xray and MRI- bulging discs  PATIENT SURVEYS:  ODI: 24/50  SCREENING FOR RED FLAGS: Bowel or bladder incontinence: No Cauda equina syndrome: No   COGNITION:  Overall cognitive status: Within functional limits for tasks assessed     SENSATION: Light touch: WFL  POSTURE:  Increased lumbar lordotic curvature  PALPATION: TTP right glute med  LUMBAR ROM:   Active  A/PROM  01/20/2022  Flexion 40  Extension 10  Right lateral flexion 20*  Left lateral flexion 20*  Right rotation 30  Left rotation 30   (Blank rows = not tested)  LE ROM:  Active  Right 01/20/2022 Left 01/20/2022  Hip flexion [facility]  Hip extension [facility]  Hip abduction    Hip adduction    Hip internal rotation    Hip external rotation    Knee flexion    Knee extension    Ankle dorsiflexion    Ankle plantarflexion    Ankle inversion    Ankle eversion     (Blank rows = not tested)  LE MMT:  MMT Right 01/20/2022 Left 01/20/2022  Hip flexion 4* 4*  Hip extension 3+ 3+  Hip abduction 4 4-  Hip adduction    Hip internal rotation 4 4*  Hip external rotation 4 4  Knee flexion 5 5  Knee extension 5 5  Ankle dorsiflexion 5 5  Ankle plantarflexion    Ankle inversion    Ankle eversion     (Blank rows = not tested)  LUMBAR SPECIAL TESTS:  Straight leg raise test: Negative, Slump test: Negative, Quadrant test: Positive, SI Compression/distraction test: Negative, and Thomas test: Positive Ely test: positive bil  FUNCTIONAL TESTS:  5 times sit to stand: 22 seconds  Timed up and go (TUG): 20 seconds   GAIT: Distance walked: lobby to [employer] device utilized: None Level of assistance: SBA Comments: decreased stride length and no toe off noticed bilaterally  TODAY'S TREATMENT:  [facility] Adult PT Treatment:                                                 DATE: 01/20/22 Therapeutic Exercise: Nustep L5 Le x 5 minutes  Standing hip extension 5 sec x 10  Sidelying  hip abduction 10 x 2 each Hip flexor stretch off edge of mat x 60 sec with active knee bending intermittently Log Roll instruction for supine <--> sit.  PPT 5 sec 10 x 2  LTR 5 sec x 10- cues  Bridge 5 sec x 10  Seated SB stretch with arm over head 10 sec x 3 each side  Seated modified childs pose with chair vs seated lumbar flexion -  PATIENT EDUCATION:  Education details: Educated patient on findings in evaluation and HEP. Person educated: Patient Education method: Explanation, Demonstration, Tactile cues, Verbal cues, and Handouts Education comprehension: verbalized understanding, returned demonstration, verbal cues required, and tactile cues required   HOME EXERCISE PROGRAM: Access Code: [email or web address] URL: [email or web address] Date: 01/10/2022 Prepared by: Edythe Lynn  Exercises Supine Quadriceps Stretch with Strap on Table - 1 x daily - 7 x weekly - 3 sets - 30 seconds hold Sidelying Hip Abduction - 1 x daily - 7 x weekly - 2 sets - 10 reps Supine Posterior Pelvic Tilt - 1 x daily - 7 x weekly - 2 sets - 10 reps Hooklying Lumbar Rotation - 1 x daily - 7 x weekly - 2 sets - 10 reps   ASSESSMENT:  CLINICAL IMPRESSION: Patient reports recent hospitalization for drop in BP. His pain after pain meds is 5/10 today. Began Nustep. Worked on hip/abdominal strength and quad/trunk stretching and reviewed HEP.He required education on log roll to sit to prevent the need for assistance with transfer.  He tolerated all therex without adverse effects.  After session he reported that his back felt good. Patient will benefit from continued skilled PT to reduce lumbar back pain and improve ability to perform ADLs.    OBJECTIVE IMPAIRMENTS Abnormal gait, decreased activity tolerance, decreased balance, decreased endurance, difficulty  walking, decreased ROM, decreased strength, impaired flexibility, obesity, and pain.   ACTIVITY LIMITATIONS cleaning, community activity, driving, and laundry.   PERSONAL FACTORS Time since onset of injury/illness/exacerbation and 3+ comorbidities: peripheral neuropathy, hx of stroke and heart attach per patient report, HTN, DVT  are also affecting patient's functional outcome.    REHAB POTENTIAL: Good  CLINICAL DECISION MAKING: Unstable/unpredictable  EVALUATION COMPLEXITY: High   GOALS: Goals reviewed with patient? No  SHORT TERM GOALS: Target date: 01/24/22  Patient will be independent with HEP for PT progression. Baseline: initial HEP provided Goal status: INITIAL    LONG TERM GOALS: Target date: 03/03/2022  ODI score of >/= 40. Baseline: 23/50 Goal status: INITIAL  2.  Patient will demonstrate >/= 4/5 MMT for bil hip musculature.  Baseline: 4- left hip ABD and 3+ bil hip EXT Goal status: INITIAL  3.  Patient will reports <4/10 right LBP when walking for 30 minutes. Baseline: 10/10 pain Goal status: INITIAL  4.  Patient will perform 5x STS in <14 seconds for decreased fall risk.   Baseline: 22 seconds Goal status: INITIAL    PLAN: PT FREQUENCY: 2x/week  PT DURATION: 6 weeks  PLANNED INTERVENTIONS: Therapeutic exercises, Therapeutic activity, Neuromuscular re-education, Balance training, Gait training, Patient/Family education, Joint mobilization, Dry Needling, Spinal mobilization, Cryotherapy, Moist heat, Taping, and Manual therapy.  PLAN FOR NEXT SESSION: Nerve glides for right LE. EXT based exercises. Prone press ups. Core and hip strengthening. Anterior hip stretches.   Check all possible CPT codes: 97110- Therapeutic Exercise, [phone]- Neuro Re-education, [phone] - Gait Training, [phone] - Manual Therapy, 97530 - Therapeutic Activities, [phone] - Self Care, and [phone] - Ultrasound     If treatment provided at  initial evaluation, no treatment charged due to lack  of authorization.       [name], PTA 01/20/22 1:23 PM Phone: [phone] Fax: [phone]        PHYSICAL THERAPY DISCHARGE SUMMARY  Visits from Start of Care: 2  Current functional level related to goals / functional outcomes: See goals   Remaining deficits: Current status unknown    Education / Equipment: HEP, theraband   Patient agrees to discharge. Patient goals were not met. Patient is being discharged due to not returning since the last visit.  Edythe Lynn, PT, DPT 03/30/22 1:24 PM

## 2022-01-24 ENCOUNTER — Ambulatory Visit: Payer: Medicaid Other | Admitting: Pulmonary Disease

## 2022-01-24 ENCOUNTER — Ambulatory Visit (INDEPENDENT_AMBULATORY_CARE_PROVIDER_SITE_OTHER): Payer: Medicaid Other | Admitting: Pulmonary Disease

## 2022-01-24 ENCOUNTER — Encounter: Payer: Self-pay | Admitting: Pulmonary Disease

## 2022-01-24 VITALS — BP 130/70 | HR 79 | Temp 97.9°F | Ht 70.0 in | Wt 336.0 lb

## 2022-01-24 DIAGNOSIS — G4733 Obstructive sleep apnea (adult) (pediatric): Secondary | ICD-10-CM

## 2022-01-24 DIAGNOSIS — R0600 Dyspnea, unspecified: Secondary | ICD-10-CM

## 2022-01-24 LAB — PULMONARY FUNCTION TEST
DL/VA % pred: 128 %
DL/VA: 5.48 ml/min/mmHg/L
DLCO cor % pred: 85 %
DLCO cor: 23.82 ml/min/mmHg
DLCO unc % pred: 79 %
DLCO unc: 22.28 ml/min/mmHg
FEF 25-75 Post: 2.49 L/sec
FEF 25-75 Pre: 2.04 L/sec
FEF2575-%Change-Post: 22 %
FEF2575-%Pred-Post: 83 %
FEF2575-%Pred-Pre: 67 %
FEV1-%Change-Post: 4 %
FEV1-%Pred-Post: 73 %
FEV1-%Pred-Pre: 70 %
FEV1-Post: 2.32 L
FEV1-Pre: 2.23 L
FEV1FVC-%Change-Post: 2 %
FEV1FVC-%Pred-Pre: 99 %
FEV6-%Change-Post: 1 %
FEV6-%Pred-Post: 73 %
FEV6-%Pred-Pre: 72 %
FEV6-Post: 2.89 L
FEV6-Pre: 2.84 L
FEV6FVC-%Pred-Post: 103 %
FEV6FVC-%Pred-Pre: 103 %
FVC-%Change-Post: 1 %
FVC-%Pred-Post: 71 %
FVC-%Pred-Pre: 69 %
FVC-Post: 2.89 L
FVC-Pre: 2.84 L
Post FEV1/FVC ratio: 81 %
Post FEV6/FVC ratio: 100 %
Pre FEV1/FVC ratio: 79 %
Pre FEV6/FVC Ratio: 100 %
RV % pred: 113 %
RV: 2.49 L
TLC % pred: 76 %
TLC: 5.37 L

## 2022-01-24 NOTE — Patient Instructions (Signed)
Full PFT performed today. °

## 2022-01-24 NOTE — Patient Instructions (Signed)
PFTs show mild reduction in lung capacity which is from weight. ?Encourage exercise and weight loss ?Your sleep study has already been scheduled we will review it when available ?Follow-up in 6 months. ?

## 2022-01-24 NOTE — Progress Notes (Signed)
Full PFT performed today. °

## 2022-01-24 NOTE — Progress Notes (Signed)
? ?      ?[name]    [id number]    02/12/1961 ? ?Primary Care Physician:Hayes, Shawnee Knapp, FNP ? ?Referring Physician: [name], FNP ?[phone] ?[place],  [place] [address] ? ?Chief complaint: Follow-up for dyspnea ? ?HPI: ?61 year old with history of COVID-19, PE, OSA, morbid obesity, coronary artery disease, DVT/PE complains of chronic dyspnea on exertion for the past 5 months. ? ?He was hospitalized in September 2022 with COVID-19 infection.  Also found to have acute pulmonary embolism.  Started on heparin drip and transition to Eliquis.  Treated with remdesivir and steroids.  He had an admission shortly thereafter for acute GI bleed in setting of anticoagulation.  Underwent EGD and colonoscopy with no significant bleeding.  He continues on Eliquis ?Also has history of coronary artery disease and is undergoing stress test and due for follow-up with cardiology ? ?He has history of OSA with sleep study done at [facility].  He has not been on CPAP for many years ? ?Pets: No pets ?Occupation: Retired Merchant navy officer ?Exposures: No mold, hot tub, Jacuzzi.  No feather pillows or comforter ?Smoking history: Never smoker ?Travel history: No significant travel history ?Relevant family history: No family history of lung disease ? ?Interim history: ?He is here for review of PFTs.  States that breathing is doing well with no issues ? ?Outpatient Encounter Medications as of 01/24/2022  ?Medication Sig  ? Accu-Chek FastClix Lancets MISC 1 Device by Other route 2 (two) times daily.   ? ACCU-CHEK GUIDE test strip 1 each by Other route 2 (two) times daily.  ? albuterol (VENTOLIN HFA) 108 (90 Base) MCG/ACT inhaler Inhale 2 puffs into the lungs every 6 (six) hours as needed for wheezing or shortness of breath.  ? apixaban (ELIQUIS) 5 MG TABS tablet Take 2 tablets ('10mg'$ ) twice daily for 7 days, then 1 tablet ('5mg'$ ) twice daily (Patient taking differently: Take 5 mg by mouth 2 (two) times daily.)  ?  atorvastatin (LIPITOR) 40 MG tablet Take 40 mg by mouth at bedtime.  ? carvedilol (COREG) 12.5 MG tablet Take 12.5 mg by mouth 2 (two) times daily.  ? cyanocobalamin 1000 MCG tablet Take by mouth.  ? diclofenac (FLECTOR) 1.3 % PTCH Place 1 patch onto the skin 2 (two) times daily as needed (back and knee pain).  ? fenofibrate (TRICOR) 145 MG tablet Take 1 tablet (145 mg total) by mouth daily. (Patient taking differently: Take 145 mg by mouth every morning.)  ? furosemide (LASIX) 40 MG tablet Take 1 tablet (40 mg total) by mouth 3 (three) times daily.  ? gabapentin (NEURONTIN) 800 MG tablet Take 800 mg by mouth 3 (three) times daily.  ? glipiZIDE (GLUCOTROL) 10 MG tablet Take 10 mg by mouth 2 (two) times daily.  ? HUMULIN R U-500 KWIKPEN 500 UNIT/ML kwikpen Inject 0-110 Units into the skin See admin instructions. Per sliding scale 3 times daily with meals  ? Insulin Pen Needle 31G X 5 MM MISC Use 1 needle daily to inject insulin as prescribed  ? isosorbide mononitrate (IMDUR) 30 MG 24 hr tablet TAKE 1 TABLET BY MOUTH DAILY (Patient taking differently: Take 30 mg by mouth daily.)  ? Menthol, Topical Analgesic, (BIOFREEZE EX) Apply 1 application topically 2 (two) times daily as needed (knee pain).  ? nitroGLYCERIN (NITROSTAT) 0.4 MG SL tablet Place 1 tablet (0.4 mg total) under the tongue every 5 (five) minutes as needed for chest pain.  ? ondansetron (ZOFRAN) 4  MG tablet Take 4 mg by mouth 2 (two) times daily as needed for nausea/vomiting.  ? Oxycodone HCl 20 MG TABS Take 20 mg by mouth 4 (four) times daily as needed (pain).  ? pantoprazole (PROTONIX) 40 MG tablet Take 40 mg by mouth every morning.  ? potassium chloride (MICRO-K) 10 MEQ CR capsule Take 20 mEq by mouth 2 (two) times daily.  ? sertraline (ZOLOFT) 50 MG tablet Take 50 mg by mouth every morning.  ? tiZANidine (ZANAFLEX) 4 MG tablet Take 4 mg by mouth at bedtime as needed for muscle spasms.  ? traZODone (DESYREL) 50 MG tablet Take 1 tablet (50 mg total) by  mouth at bedtime.  ? Vitamin D, Ergocalciferol, (DRISDOL) 1.25 MG (50000 UT) CAPS capsule Take 50,000 Units by mouth every Monday.  ? ?No facility-administered encounter medications on file as of 01/24/2022.  ? ?Physical Exam: ?Blood pressure 130/70, pulse 79, temperature 97.9 ?F (36.6 ?C), temperature source Oral, height '5\' 10"'$  (1.778 m), weight (!) 336 lb (152.4 kg), SpO2 94 %. ?Gen:      No acute distress ?HEENT:  EOMI, sclera anicteric ?Neck:     No masses; no thyromegaly ?Lungs:    Clear to auscultation bilaterally; normal respiratory effort ?CV:         Regular rate and rhythm; no murmurs ?Abd:      + bowel sounds; soft, non-tender; no palpable masses, no distension ?Ext:    No edema; adequate peripheral perfusion ?Skin:      Warm and dry; no rash ?Neuro: alert and oriented x 3 ?Psych: normal mood and affect  ? ?Data Reviewed: ?Imaging: ?CT 06/25/2021-pulm embolism with moderate clot burden, RV/LV ratio 1.08, patchy groundglass opacities in the right upper lobe, chronic dilatation of pulmonary artery ? ?CTA 10/08/2021-significantly reduced clot burden which is essentially resolved with minimal residual clot. ? ?Chest x-ray 01/12/2022-low lung volumes.  No acute infiltrate. ?I have reviewed the images personally. ? ?PFTs: ?01/24/2022 ?FVC 2.89 [100%], FEV1 2.32 [73%], F/F 81, TLC 5.37 [76%], DLCO 22.80 [79%] ?Mild restriction ? ?Labs: ? ?Cardiac: ?Echocardiogram 06/28/2021 ?LVEF 60 to 65%, moderate LVH, normal RV systolic size and function ? ?Assessment:  ?Chronic dyspnea ?History of pulmonary embolism ?Coronary artery disease, HfPEF ?He is on anticoagulation for pulmonary embolism.  Per oncology note recommendation is for 6 months but as per patient he had another PE/DVT in 2018 though I cannot find records of this.  No pulmonary hypertension on recent echocardiogram. ? ?PFTs with mild restriction which is secondary to body habitus ?Suspect dyspnea is multifactorial from obesity, deconditioning, heart  failure ? ?Encouraged exercise and weight loss ? ?Sleep apnea ?Untreated.  Not currently on CPAP ?Sleep study has been scheduled for the next week.  We will review results when available. ? ?Plan/Recommendations: ?Weight loss with exercise and diet. ?Home sleep study ? ?Marshell Garfinkel MD ?[facility] ?01/24/2022, 12:07 PM ? ?CC: [name], FNP ? ?  ?

## 2022-01-25 ENCOUNTER — Ambulatory Visit: Payer: Medicaid Other

## 2022-01-25 DIAGNOSIS — G4733 Obstructive sleep apnea (adult) (pediatric): Secondary | ICD-10-CM

## 2022-01-25 DIAGNOSIS — R0683 Snoring: Secondary | ICD-10-CM

## 2022-01-26 NOTE — Therapy (Incomplete)
?OUTPATIENT PHYSICAL THERAPY TREATMENT NOTE ? ? ?Patient Name: [name] ?MRN: [id number] ?DOB:[birth year], 61 y.o., male ?Today's Date: 01/26/2022 ? ?PCP: [name], FNP ?REFERRING PROVIDER: [name], FNP ? ?END OF SESSION:  ? ? ?Past Medical History:  ?Diagnosis Date  ? Anemia   ? Arthritis   ? Back pain   ? CAD (coronary artery disease)   ? a. s/p DES to LAD in 05/2016  ? Cervical radiculopathy   ? Chronic diastolic CHF (congestive heart failure) ([place])   ? Chronic pain   ? Depression   ? DVT (deep venous thrombosis) ([place])   ? Hematemesis   ? Hepatic steatosis   ? Hyperlipidemia   ? Hypertension   ? IBS (irritable bowel syndrome)   ? Morbid obesity ([place])   ? OSA (obstructive sleep apnea)   ? Pancreatitis   ? PE (pulmonary thromboembolism) ([place])   ? Peripheral neuropathy   ? PUD (peptic ulcer disease)   ? Renal disorder   ? Stroke [facility])   ? a. ?details unclear - not seen on imaging when he was admitted in 05/2017 for TIA symptoms which were felt due to cervical radiculopathy.  ? Thoracic aortic ectasia (HCC)   ? a. 4.3cm ectatic ascending thoracic aorta by CT 06/2017.   ? Type 2 diabetes mellitus ([place])   ? ?Past Surgical History:  ?Procedure Laterality Date  ? CARDIAC CATHETERIZATION N/A 05/31/2016  ? Procedure: Left Heart Cath and Coronary Angiography;  Surgeon: Peter M Martinique, MD;  Location: [place] [facility];  Service: Cardiovascular;  Laterality: N/A;  ? CARDIAC CATHETERIZATION N/A 05/31/2016  ? Procedure: Intravascular Pressure Wire/FFR Study;  Surgeon: Peter M Martinique, MD;  Location: [place];  Service: Cardiovascular;  Laterality: N/A;  ? CARDIAC CATHETERIZATION N/A 05/31/2016  ? Procedure: Coronary Stent Intervention;  Surgeon: Peter M Martinique, MD;  Location: [place] [facility];  Service: Cardiovascular;  Laterality: N/A;  ? COLONOSCOPY N/A 07/17/2021  ? Procedure: COLONOSCOPY;  Surgeon: Wilford Corner, MD;  Location: [facility];  Service: Endoscopy;  Laterality: N/A;  ? COLONOSCOPY  WITH PROPOFOL N/A 04/29/2020  ? Procedure: COLONOSCOPY WITH PROPOFOL;  Surgeon: Wilford Corner, MD;  Location: [place];  Service: Endoscopy;  Laterality: N/A;  ? ESOPHAGOGASTRODUODENOSCOPY N/A 04/29/2020  ? Procedure: ESOPHAGOGASTRODUODENOSCOPY (EGD);  Surgeon: Wilford Corner, MD;  Location: [place];  Service: Endoscopy;  Laterality: N/A;  ? ESOPHAGOGASTRODUODENOSCOPY (EGD) WITH PROPOFOL N/A 07/17/2021  ? Procedure: ESOPHAGOGASTRODUODENOSCOPY (EGD) WITH PROPOFOL;  Surgeon: Wilford Corner, MD;  Location: [facility];  Service: Endoscopy;  Laterality: N/A;  ? LEFT HEART CATH AND CORONARY ANGIOGRAPHY N/A 12/08/2017  ? Procedure: LEFT HEART CATH AND CORONARY ANGIOGRAPHY;  Surgeon: Leonie Man, MD;  Location: [place] [facility];  Service: Cardiovascular;  Laterality: N/A;  ? LEFT HEART CATHETERIZATION WITH CORONARY ANGIOGRAM N/A 02/03/2014  ? Procedure: LEFT HEART CATHETERIZATION WITH CORONARY ANGIOGRAM;  Surgeon: Pixie Casino, MD;  Location: [facility] CATH LAB;  Service: Cardiovascular;  Laterality: N/A;  ? left leg stent     ? POLYPECTOMY  04/29/2020  ? Procedure: POLYPECTOMY;  Surgeon: Wilford Corner, MD;  Location: [facility] ENDOSCOPY;  Service: Endoscopy;;  ? POLYPECTOMY  07/17/2021  ? Procedure: POLYPECTOMY;  Surgeon: Wilford Corner, MD;  Location: [facility]OS[facility];  Service: Endoscopy;;  ? ?Patient Active Problem List  ? Diagnosis Date Noted  ? Acute kidney injury ([place]) 01/12/2022  ? GI bleed 07/15/2021  ? Pulmonary embolism ([place]) 07/15/2021  ? Acute pulmonary embolism ([place]) 06/26/2021  ?  Diabetic ulcer of lower leg ([place]) 06/26/2021  ? Acute respiratory failure with hypoxia ([place]) 06/26/2021  ? COVID-19 virus infection 06/26/2021  ? Lumbar radiculitis 03/30/2021  ? Morbid obesity ([place]) 01/15/2021  ? Personal history of colonic polyps 01/15/2021  ? Rectal bleeding 01/15/2021  ? Pain due to onychomycosis of toenails of both feet 01/15/2021  ? Epistaxis, recurrent 10/21/2020  ? Laceration of nose 10/21/2020  ?  Syncope 10/15/2020  ? OSA (obstructive sleep apnea)   ? Bilateral carotid artery stenosis 07/28/2020  ? Bilateral lower extremity edema 07/28/2020  ? CHF (congestive heart failure) ([place]) 07/28/2020  ? DDD (degenerative disc disease), lumbar 07/28/2020  ? Diabetic peripheral neuropathy ([place]) 07/28/2020  ? Fatty liver 07/28/2020  ? Irritable bowel syndrome with diarrhea 07/28/2020  ? Osteoarthritis of right hip 07/28/2020  ? Upper GI bleed 04/28/2020  ? Hypertensive urgency 04/27/2020  ? Acute GI bleeding 04/27/2020  ? Mixed diabetic hyperlipidemia associated with type 2 diabetes mellitus ([place]) 04/27/2020  ? Rotator cuff arthropathy 10/30/2019  ? Cervical radiculopathy 09/24/2019  ? Trochanteric bursitis of right hip 09/24/2019  ? Chronic diastolic CHF (congestive heart failure) ([place]) 08/23/2019  ? ARF (acute renal failure) ([place]) 08/01/2019  ? Hyperkalemia 08/01/2019  ? Uncontrolled type 2 diabetes mellitus with hyperglycemia ([place]) 08/01/2019  ? AKI (acute kidney injury) ([place])   ? Hypotension 07/31/2019  ? DKA (diabetic ketoacidosis) ([place]) 05/05/2019  ? Blurred vision 05/05/2019  ? Trochanteric bursitis 02/11/2019  ? Long-term insulin use ([place]) 01/25/2019  ? Alteration consciousness 11/19/2018  ? Neck pain 10/09/2018  ? Left arm weakness 10/09/2018  ? B12 deficiency 08/10/2017  ? Persistent headaches 08/09/2017  ? GERD (gastroesophageal reflux disease) 06/29/2017  ? Left arm numbness   ? Cerebral embolism with cerebral infarction 06/17/2017  ? TIA (transient ischemic attack) 06/17/2017  ? Chronic back pain 12/29/2016  ? Depression 12/15/2016  ? Vitamin D deficiency 12/09/2016  ? Right hip pain 12/07/2016  ? Hypokalemia 12/07/2016  ? Type 2 diabetes mellitus with vascular disease ([place]) 05/31/2016  ? Normocytic normochromic anemia 05/31/2016  ? Chest pain 05/31/2016  ? Coronary artery disease involving native coronary artery of native heart without angina pectoris 01/06/2016  ? Lactic acidosis 05/28/2014  ? Nonspecific  chest pain 01/29/2014  ? Uncontrolled secondary diabetes with peripheral neuropathy ([place]) 01/29/2014  ? Obesity, Class III, BMI 40-49.9 (morbid obesity) ([place]) 01/29/2014  ? Snoring 01/29/2014  ? Dyslipidemia 01/29/2014  ? HTN (hypertension) 01/29/2014  ? Abnormal nuclear stress test 01/29/2014  ? ? ?REFERRING PROVIDER: [name], FNP ?  ?REFERRING DIAG: back pain, wrist pain ? ?THERAPY DIAG:  ?No diagnosis found. ? ?PERTINENT HISTORY:  ?Carpal tunnel surgery on Left wrist ?Heart attack and strokes back in 2018 ? ?PRECAUTIONS: None ? ?SUBJECTIVE:  ?I was having back pain earlier. I took pain medicine and now pain is 5/10. I was in the hospital for a couple of days. My BP dropped after MD changed my meds and I was admitted. I am better now. I am having some bleeding on my left shin. It is not a new condition.  ? ?PAIN:  ?Are you having pain? Yes:  ?NRPS: 7/10 ?At worst: 10/10 at rest and walking ?Pain location: R low side ?Pain description: aching, dull, sometimes sharp ?Aggravating factors: walking and rest ?Relieving factors: pain meds ? ?PATIENT GOALS To alleviate pain. ? ? ?OBJECTIVE:  ?PATIENT SURVEYS:  ?ODI: 24/50 ?  ?POSTURE:  ?Increased lumbar lordotic curvature ?  ?PALPATION: ?TTP right glute med ?  ?  LUMBAR ROM:  ?  ?Active  A/PROM  ?01/10/2022  ?Flexion 40  ?Extension 10  ?Right lateral flexion 20*  ?Left lateral flexion 20*  ?Right rotation 30  ?Left rotation 30  ? (Blank rows = not tested) ?  ?LE ROM: ?  ?Active  Right ?01/10/2022 Left ?01/10/2022  ?Hip flexion [facility] WFL  ?Hip extension [facility] WFL  ?Hip abduction      ?Hip adduction      ?Hip internal rotation      ?Hip external rotation      ?Knee flexion      ?Knee extension      ?Ankle dorsiflexion      ?Ankle plantarflexion      ?Ankle inversion      ?Ankle eversion      ? (Blank rows = not tested) ?  ?LE MMT: ?  ?MMT Right ?01/10/2022 Left ?01/10/2022  ?Hip flexion 4* 4*  ?Hip extension 3+ 3+  ?Hip abduction 4 4-  ?Hip adduction      ?Hip internal  rotation 4 4*  ?Hip external rotation 4 4  ?Knee flexion 5 5  ?Knee extension 5 5  ?Ankle dorsiflexion 5 5  ?Ankle plantarflexion      ?Ankle inversion      ?Ankle eversion      ? (Blank rows = not tested) ?

## 2022-01-27 ENCOUNTER — Other Ambulatory Visit: Payer: Self-pay | Admitting: *Deleted

## 2022-01-27 ENCOUNTER — Ambulatory Visit: Payer: Medicaid Other | Admitting: Physical Therapy

## 2022-01-27 ENCOUNTER — Encounter (HOSPITAL_BASED_OUTPATIENT_CLINIC_OR_DEPARTMENT_OTHER): Payer: Medicaid Other | Attending: Internal Medicine | Admitting: Internal Medicine

## 2022-01-27 DIAGNOSIS — I890 Lymphedema, not elsewhere classified: Secondary | ICD-10-CM | POA: Insufficient documentation

## 2022-01-27 DIAGNOSIS — E11622 Type 2 diabetes mellitus with other skin ulcer: Secondary | ICD-10-CM | POA: Insufficient documentation

## 2022-01-27 DIAGNOSIS — L97812 Non-pressure chronic ulcer of other part of right lower leg with fat layer exposed: Secondary | ICD-10-CM | POA: Insufficient documentation

## 2022-01-27 DIAGNOSIS — I87313 Chronic venous hypertension (idiopathic) with ulcer of bilateral lower extremity: Secondary | ICD-10-CM | POA: Insufficient documentation

## 2022-01-27 DIAGNOSIS — I110 Hypertensive heart disease with heart failure: Secondary | ICD-10-CM | POA: Insufficient documentation

## 2022-01-27 DIAGNOSIS — Z86718 Personal history of other venous thrombosis and embolism: Secondary | ICD-10-CM | POA: Diagnosis not present

## 2022-01-27 DIAGNOSIS — Z8673 Personal history of transient ischemic attack (TIA), and cerebral infarction without residual deficits: Secondary | ICD-10-CM | POA: Diagnosis not present

## 2022-01-27 DIAGNOSIS — Z794 Long term (current) use of insulin: Secondary | ICD-10-CM | POA: Diagnosis not present

## 2022-01-27 DIAGNOSIS — I872 Venous insufficiency (chronic) (peripheral): Secondary | ICD-10-CM | POA: Insufficient documentation

## 2022-01-27 DIAGNOSIS — I2699 Other pulmonary embolism without acute cor pulmonale: Secondary | ICD-10-CM

## 2022-01-27 DIAGNOSIS — L97822 Non-pressure chronic ulcer of other part of left lower leg with fat layer exposed: Secondary | ICD-10-CM | POA: Diagnosis not present

## 2022-01-27 DIAGNOSIS — Z09 Encounter for follow-up examination after completed treatment for conditions other than malignant neoplasm: Secondary | ICD-10-CM | POA: Diagnosis not present

## 2022-01-27 DIAGNOSIS — E1140 Type 2 diabetes mellitus with diabetic neuropathy, unspecified: Secondary | ICD-10-CM | POA: Insufficient documentation

## 2022-01-27 DIAGNOSIS — I5032 Chronic diastolic (congestive) heart failure: Secondary | ICD-10-CM | POA: Insufficient documentation

## 2022-01-27 NOTE — Progress Notes (Signed)
Reichel, [place] L. ([id number]) ?Visit Report for 01/27/2022 ?Allergy List Details ?Patient Name: Date of Service: ?[name], [name] 01/27/2022 9:00 A M ?Medical Record Number: [id number] ?Patient Account Number: [id number] ?Date of Birth/Sex: Treating RN: ?Nov 12, 1960 (61 y.o. M) Rolin Barry, Bobbi ?Primary Care Tayshawn Purnell: [name] Other Clinician: ?Referring Zniya Cottone: ?Treating Posey Jasmin/Extender: [name] ?[name] ?Weeks in Treatment: 0 ?Allergies ?Active Allergies ?ibuprofen ?Aleve ?NSAIDS (Non-Steroidal Anti-Inflammatory Drug) ?Vascepa ?coconut flavor ?Allergy Notes ?Electronic Signature(s) ?Signed: 01/27/2022 5:34:01 PM By: Deon Pilling RN, BSN ?Entered By: Deon Pilling on 01/27/2022 09:24:17 ?-------------------------------------------------------------------------------- ?Arrival Information Details ?Patient Name: Date of Service: ?[name], [name] 01/27/2022 9:00 A M ?Medical Record Number: [id number] ?Patient Account Number: [id number] ?Date of Birth/Sex: Treating RN: ?Feb 09, 1961 (61 y.o. M) Rolin Barry, Bobbi ?Primary Care Mahira Petras: [name] Other Clinician: ?Referring Orla Estrin: ?Treating Aeon Kessner/Extender: [name] ?[name] ?Weeks in Treatment: 0 ?Visit Information ?Patient Arrived: Ambulatory ?Arrival Time: 09:18 ?Accompanied By: self ?Transfer Assistance: None ?Patient Identification Verified: Yes ?Secondary Verification Process Completed: Yes ?Patient Requires Transmission-Based No ?Precautions: ?Patient Has Alerts: Yes ?Patient Alerts: Patient on Blood Thinner ?09/06/21 ABI L1.01 office ?09/06/21 ABI R1.02 office ?History Since Last Visit ?Added or deleted any medications: No ?Any new allergies or adverse reactions: No ?Had a fall or experienced change in activities of daily living that may affect risk of falls: No ?Signs or symptoms of abuse/neglect since last visito No ?Hospitalized since last visit: No ?Implantable device outside of the clinic excluding cellular tissue based  products placed in the center since last visit: No ?Has Compression in Place as Prescribed: Yes ?Electronic Signature(s) ?Signed: 01/27/2022 5:34:01 PM By: Deon Pilling RN, BSN ?Entered By: Deon Pilling on 01/27/2022 09:39:56 ?-------------------------------------------------------------------------------- ?Clinic Level of Care Assessment Details ?Patient Name: Date of Service: ?[name], [name] 01/27/2022 9:00 A M ?Medical Record Number: [id number] ?Patient Account Number: [id number] ?Date of Birth/Sex: Treating RN: ?[birth year] (61 y.o. M) Rolin Barry, Bobbi ?Primary Care Rotunda Worden: [name] Other Clinician: ?Referring Seher Schlagel: ?Treating Kortni Hasten/Extender: [name] ?[name] ?Weeks in Treatment: 0 ?Clinic Level of Care Assessment Items ?TOOL 1 Quantity Score ?X- 1 0 ?Use when EandM and Procedure is performed on INITIAL visit ?ASSESSMENTS - Nursing Assessment / Reassessment ?X- 1 20 ?General Physical Exam (combine w/ comprehensive assessment (listed just below) when performed on new pt. evals) ?X- 1 25 ?Comprehensive Assessment (HX, ROS, Risk Assessments, Wounds Hx, etc.) ?ASSESSMENTS - Wound and Skin Assessment / Reassessment ?X- 1 10 ?Dermatologic / Skin Assessment (not related to wound area) ?ASSESSMENTS - Ostomy and/or Continence Assessment and Care ?'[]'$  - 0 ?Incontinence Assessment and Management ?'[]'$  - 0 ?Ostomy Care Assessment and Management (repouching, etc.) ?PROCESS - Coordination of Care ?'[]'$  - 0 ?Simple Patient / Family Education for ongoing care ?X- 1 20 ?Complex (extensive) Patient / Family Education for ongoing care ?X- 1 10 ?Staff obtains Consents, Records, T Results / Process Orders ?est ?'[]'$  - 0 ?Staff telephones HHA, Nursing Homes / Clarify orders / etc ?'[]'$  - 0 ?Routine Transfer to another Facility (non-emergent condition) ?'[]'$  - 0 ?Routine Hospital Admission (non-emergent condition) ?'[]'$  - 0 ?New Admissions / Biomedical engineer / Ordering NPWT Apligraf, etc. ?, ?'[]'$  - 0 ?Emergency  Hospital Admission (emergent condition) ?PROCESS - Special Needs ?'[]'$  - 0 ?Pediatric / Minor Patient Management ?'[]'$  - 0 ?Isolation Patient Management ?'[]'$  - 0 ?Hearing / Language / Visual special needs ?'[]'$  - 0 ?Assessment of Community assistance (transportation, D/C planning, etc.) ?'[]'$  - 0 ?Additional assistance / Altered mentation ?'[]'$  - 0 ?Support Surface(s) Assessment (  bed, cushion, seat, etc.) ?INTERVENTIONS - Miscellaneous ?'[]'$  - 0 ?External ear exam ?'[]'$  - 0 ?Patient Transfer (multiple staff / Civil Service fast streamer / Similar devices) ?'[]'$  - 0 ?Simple Staple / Suture removal (25 or less) ?'[]'$  - 0 ?Complex Staple / Suture removal (26 or more) ?'[]'$  - 0 ?Hypo/Hyperglycemic Management (do not check if billed separately) ?'[]'$  - 0 ?Ankle / Brachial Index (ABI) - do not check if billed separately ?Has the patient been seen at the hospital within the last three years: Yes ?Total Score: 85 ?Level Of Care: New/Established - Level 3 ?Electronic Signature(s) ?Signed: 01/27/2022 5:34:01 PM By: Deon Pilling RN, BSN ?Entered By: Deon Pilling on 01/27/2022 10:25:54 ?-------------------------------------------------------------------------------- ?Compression Therapy Details ?Patient Name: ?Date of Service: ?[name], [place] 01/27/2022 9:00 A M ?Medical Record Number: [id number] ?Patient Account Number: [id number] ?Date of Birth/Sex: ?Treating RN: ?09-23-1961 (61 y.o. M) Rolin Barry, Bobbi ?Primary Care Braidyn Scorsone: [name] ?Other Clinician: ?Referring Elior Robinette: ?Treating Qusay Villada/Extender: [name] ?[name] ?Weeks in Treatment: 0 ?Compression Therapy Performed for Wound Assessment: Wound #3 Right,Posterior Lower Leg ?Performed By: Clinician Deon Pilling, RN ?Compression Type: Three Layer ?Post Procedure Diagnosis ?Same as Pre-procedure ?Electronic Signature(s) ?Signed: 01/27/2022 5:34:01 PM By: Deon Pilling RN, BSN ?Entered By: Deon Pilling on 01/27/2022  10:18:59 ?-------------------------------------------------------------------------------- ?Compression Therapy Details ?Patient Name: ?Date of Service: ?[name], [place] 01/27/2022 9:00 A M ?Medical Record Number: [id number] ?Patient Account Number: [id number] ?Date of Birth/Sex: ?Treating RN: ?07/05/1961 (61 y.o. M) Rolin Barry, Bobbi ?Primary Care Athene Schuhmacher: [name] ?Other Clinician: ?Referring Laurelin Elson: ?Treating Nayely Dingus/Extender: [name] ?[name] ?Weeks in Treatment: 0 ?Compression Therapy Performed for Wound Assessment: Wound #4 Left,Medial Lower Leg ?Performed By: Clinician Deon Pilling, RN ?Compression Type: Three Layer ?Post Procedure Diagnosis ?Same as Pre-procedure ?Electronic Signature(s) ?Signed: 01/27/2022 5:34:01 PM By: Deon Pilling RN, BSN ?Entered By: Deon Pilling on 01/27/2022 10:18:59 ?-------------------------------------------------------------------------------- ?Encounter Discharge Information Details ?Patient Name: ?Date of Service: ?[name], [place] 01/27/2022 9:00 A M ?Medical Record Number: [id number] ?Patient Account Number: [id number] ?Date of Birth/Sex: ?Treating RN: ?1961/08/11 (61 y.o. M) Rolin Barry, Bobbi ?Primary Care Wyonia Fontanella: [name] ?Other Clinician: ?Referring Roberta Kelly: ?Treating Aseret Hoffman/Extender: [name] ?[name] ?Weeks in Treatment: 0 ?Encounter Discharge Information Items ?Discharge Condition: Stable ?Ambulatory Status: Ambulatory ?Discharge Destination: Home ?Transportation: Private Auto ?Accompanied By: self ?Schedule Follow-up Appointment: Yes ?Clinical Summary of Care: ?Electronic Signature(s) ?Signed: 01/27/2022 5:34:01 PM By: Deon Pilling RN, BSN ?Entered By: Deon Pilling on 01/27/2022 10:26:34 ?-------------------------------------------------------------------------------- ?Lower Extremity Assessment Details ?Patient Name: ?Date of Service: ?[name], [place] 01/27/2022 9:00 A M ?Medical Record Number: [id number] ?Patient Account  Number: [id number] ?Date of Birth/Sex: ?Treating RN: ?1961-05-29 (61 y.o. M) Rolin Barry, Bobbi ?Primary Care Amparo Donalson: [name] ?Other Clinician: ?Referring Chinita Schimpf: ?Treating Laurin Paulo/Extender: [name] ?[name] ?Weeks in Treatment: 0 ?Edema Assessment ?Assessed: [Left: Yes] [

## 2022-01-27 NOTE — Progress Notes (Signed)
Gama, Treyden L. ([id number]) ?Visit Report for 01/27/2022 ?Abuse Risk Screen Details ?Patient Name: Date of Service: ?[name], [name] 01/27/2022 9:00 A M ?Medical Record Number: [id number] ?Patient Account Number: [id number] ?Date of Birth/Sex: Treating RN: ?1961-06-14 (61 y.o. M) Rolin Barry, Bobbi ?Primary Care Haakon Titsworth: [name] Other Clinician: ?Referring Leshia Kope: ?Treating Annika Selke/Extender: [name] ?[name] ?Weeks in Treatment: 0 ?Abuse Risk Screen Items ?Answer ?ABUSE RISK SCREEN: ?Has anyone close to you tried to hurt or harm you recentlyo No ?Do you feel uncomfortable with anyone in your familyo No ?Has anyone forced you do things that you didnt want to doo No ?Electronic Signature(s) ?Signed: 01/27/2022 5:34:01 PM By: Deon Pilling RN, BSN ?Entered By: Deon Pilling on 01/27/2022 20:10:07 ?-------------------------------------------------------------------------------- ?Activities of Daily Living Details ?Patient Name: Date of Service: ?[name], [name] 01/27/2022 9:00 A M ?Medical Record Number: [id number] ?Patient Account Number: [id number] ?Date of Birth/Sex: Treating RN: ?Nov 28, 1960 (61 y.o. M) Rolin Barry, Bobbi ?Primary Care Lenix Benoist: [name] Other Clinician: ?Referring Johnchristopher Sarvis: ?Treating Sandrina Heaton/Extender: [name] ?[name] ?Weeks in Treatment: 0 ?Activities of Daily Living Items ?Answer ?Activities of Daily Living (Please select one for each item) ?[place] ?T Medications ?ake Completely Able ?Use T elephone Completely Able ?Care for Appearance Completely Able ?Use T oilet Completely Able ?Bath / Shower Completely Able ?Dress Self Completely Able ?Feed Self Completely Able ?Walk Completely Able ?Get In / Out Bed Completely Able ?Housework Completely Able ?Prepare Meals Completely Able ?Handle Money Completely Able ?Shop for Self Completely Able ?Electronic Signature(s) ?Signed: 01/27/2022 5:34:01 PM By: Deon Pilling RN, BSN ?Entered By: Deon Pilling on 01/27/2022 09:26:46 ?-------------------------------------------------------------------------------- ?Education Screening Details ?Patient Name: ?Date of Service: ?[name], [name] 01/27/2022 9:00 A M ?Medical Record Number: [id number] ?Patient Account Number: [id number] ?Date of Birth/Sex: ?Treating RN: ?Mar 02, 1961 (61 y.o. M) Rolin Barry, Bobbi ?Primary Care Taya Ashbaugh: [name] ?Other Clinician: ?Referring Kue Fox: ?Treating Garnet Chatmon/Extender: [name] ?[name] ?Weeks in Treatment: 0 ?Primary Learner Assessed: Patient ?Learning Preferences/Education Level/Primary Language ?Learning Preference: Explanation, Demonstration, Printed Material ?Highest Education Level: High School ?Preferred Language: English ?Cognitive Barrier ?Language Barrier: No ?Translator Needed: No ?Memory Deficit: No ?Emotional Barrier: No ?Cultural/Religious Beliefs Affecting Medical Care: No ?Physical Barrier ?Impaired Vision: No ?Impaired Hearing: No ?Decreased Hand dexterity: No ?Knowledge/Comprehension ?Knowledge Level: High ?Comprehension Level: High ?Ability to understand written instructions: High ?Ability to understand verbal instructions: High ?Motivation ?Anxiety Level: Calm ?Cooperation: Cooperative ?Education Importance: Acknowledges Need ?Interest in Health Problems: Asks Questions ?Perception: Coherent ?Willingness to Engage in Self-Management High ?Activities: ?Readiness to Engage in Self-Management High ?Activities: ?Electronic Signature(s) ?Signed: 01/27/2022 5:34:01 PM By: Deon Pilling RN, BSN ?Entered By: Deon Pilling on 01/27/2022 09:27:25 ?-------------------------------------------------------------------------------- ?Fall Risk Assessment Details ?Patient Name: ?Date of Service: ?[name], [name] 01/27/2022 9:00 A M ?Medical Record Number: [id number] ?Patient Account Number: [id number] ?Date of Birth/Sex: ?Treating RN: ?[birth year] (61 y.o. M) Rolin Barry, Bobbi ?Primary Care Teleah Villamar: [name] ?Other  Clinician: ?Referring Kayci Belleville: ?Treating Rafia Shedden/Extender: [name] ?[name] ?Weeks in Treatment: 0 ?Fall Risk Assessment Items ?Have you had 2 or more falls in the last 12 monthso 0 No ?Have you had any fall that resulted in injury in the last 12 monthso 0 No ?FALLS RISK SCREEN ?History of falling - immediate or within 3 months 25 Yes ?Secondary diagnosis (Do you have 2 or more medical diagnoseso) 0 No ?Ambulatory aid ?None/bed rest/wheelchair/nurse 0 Yes ?Crutches/cane/walker 0 No ?Furniture 0 No ?Intravenous therapy Access/Saline/Heparin Lock 0 No ?Gait/Transferring ?Normal/ bed rest/ wheelchair 0 Yes ?  Weak (short steps with or without shuffle, stooped but able to lift head while walking, may seek 0 No ?support from furniture) ?Impaired (short steps with shuffle, may have difficulty arising from chair, head down, impaired 0 No ?balance) ?Mental Status ?Oriented to own ability 0 Yes ?Electronic Signature(s) ?Signed: 01/27/2022 5:34:01 PM By: Deon Pilling RN, BSN ?Entered By: Deon Pilling on 01/27/2022 09:27:36 ?-------------------------------------------------------------------------------- ?Foot Assessment Details ?Patient Name: ?Date of Service: ?[name], [name] 01/27/2022 9:00 A M ?Medical Record Number: [id number] ?Patient Account Number: [id number] ?Date of Birth/Sex: ?Treating RN: ?08/27/1961 (61 y.o. M) Rolin Barry, Bobbi ?Primary Care Dorie Ohms: [name] ?Other Clinician: ?Referring Traivon Morrical: ?Treating Thanya Cegielski/Extender: [name] ?[name] ?Weeks in Treatment: 0 ?Foot Assessment Items ?Site Locations ?+ = Sensation present, - = Sensation absent, C = Callus, U = Ulcer ?R = Redness, W = Warmth, M = Maceration, PU = Pre-ulcerative lesion ?F = Fissure, S = Swelling, D = Dryness ?Assessment ?Right: Left: ?Other Deformity: No No ?Prior Foot Ulcer: No No ?Prior Amputation: No No ?Charcot Joint: No No ?Ambulatory Status: Ambulatory Without Help ?Gait: Steady ?Electronic  Signature(s) ?Signed: 01/27/2022 5:34:01 PM By: Deon Pilling RN, BSN ?Entered By: Deon Pilling on 01/27/2022 09:36:09 ?-------------------------------------------------------------------------------- ?Nutrition Risk Screening Details ?Patient Name: ?Date of Service: ?[name], [name] 01/27/2022 9:00 A M ?Medical Record Number: [id number] ?Patient Account Number: [id number] ?Date of Birth/Sex: ?Treating RN: ?1960-12-01 (61 y.o. M) Rolin Barry, Bobbi ?Primary Care Lux Skilton: [name] ?Other Clinician: ?Referring Kylon Philbrook: ?Treating Kaedance Magos/Extender: [name] ?[name] ?Weeks in Treatment: 0 ?Height (in): 70 ?Weight (lbs): 331 ?Body Mass Index (BMI): 47.5 ?Nutrition Risk Screening Items ?Score Screening ?NUTRITION RISK SCREEN: ?I have an illness or condition that made me change the kind and/or amount of food I eat 2 Yes ?I eat fewer than two meals per day 0 No ?I eat few fruits and vegetables, or milk products 0 No ?I have three or more drinks of beer, liquor or wine almost every day 0 No ?I have tooth or mouth problems that make it hard for me to eat 0 No ?I don't always have enough money to buy the food I need 0 No ?I eat alone most of the time 0 No ?I take three or more different prescribed or over-the-counter drugs a day 1 Yes ?Without wanting to, I have lost or gained 10 pounds in the last six months 0 No ?I am not always physically able to shop, cook and/or feed myself 0 No ?Nutrition Protocols ?Good Risk Protocol ?Provide education on elevated blood ?Moderate Risk Protocol 0 sugars and impact on wound healing, ?as applicable ?High Risk Proctocol ?Risk Level: Moderate Risk ?Score: 3 ?Electronic Signature(s) ?Signed: 01/27/2022 5:34:01 PM By: Deon Pilling RN, BSN ?Entered By: Deon Pilling on 01/27/2022 09:28:40 ?

## 2022-01-27 NOTE — Progress Notes (Signed)
Fredricksen, Ida L. ([id number]) ?Visit Report for 01/27/2022 ?Chief Complaint Document Details ?Patient Name: Date of Service: ?[name], Raj L. 01/27/2022 9:00 A M ?Medical Record Number: [id number] ?Patient Account Number: [id number] ?Date of Birth/Sex: Treating RN: ?January 07, 1961 (61 y.o. M) Rolin Barry, Bobbi ?Primary Care Provider: [name] Other Clinician: ?Referring Provider: ?Treating Provider/Extender: [name] ?[name] ?Weeks in Treatment: 0 ?Information Obtained from: Patient ?Chief Complaint ?08/16/2021: Bilateral lower extremity scattered blisters. 1 small open wound limited to skin breakdown on the right lower extremity. ?08/23/2021; 1 small open wound limited to skin breakdown on the left lower extremity ?01/27/2022; patient with bilateral lower extremity wounds ?Electronic Signature(s) ?Signed: 01/27/2022 12:11:35 PM By: [name] DO ?Entered By: [name] on 01/27/2022 11:53:55 ?-------------------------------------------------------------------------------- ?HPI Details ?Patient Name: Date of Service: ?[name], Alan L. 01/27/2022 9:00 A M ?Medical Record Number: [id number] ?Patient Account Number: [id number] ?Date of Birth/Sex: Treating RN: ?02-Dec-1960 (61 y.o. M) Rolin Barry, Bobbi ?Primary Care Provider: [name] Other Clinician: ?Referring Provider: ?Treating Provider/Extender: [name] ?[name] ?Weeks in Treatment: 0 ?History of Present Illness ?HPI Description: Admission 08/16/2021 ?Mr. Hommer Cunliffe is a 61 year old male with a past medical history of CVA, hypertension, uncontrolled type 2 diabetes on insulin complicated by peripheral ?neuropathy, Chronic venous insufficiency/lymphedema, diastolic CHF, and left lower extremity DVT that presents to clinic today for an open wound to his right ?lower extremity. He reports an ongoing issue with blistering to his legs and weeping that wax and wane over the past 6 months. He was previously seen in our ?clinic in June  2022 however he had closed wounds at that time. He reported that his diuretic was increased then and helped with symptom control. About 2 ?months ago he developed increased blistering and swelling again. He reports wearing 20/30 compression stockings however it is unclear if he is doing this daily ?since he reports the stockings are very difficult to put on. He currently denies signs of infection. ?10/31; patient presents for follow-up. He has tolerated the compression wraps well. The right leg wound has healed. He does have a small open wound now ?present on the left anterior lower extremity limited to skin breakdown. He would like to continue wrapping both legs. He states he cannot afford juxta lite ?compression and would like to use his compression stockings he currently owns. He denies signs of infection. ?11/7; patient presents for follow-up. He has no issues or complaints today. While in office he scratched With his fingers at his right leg and now has a small ?open wound. He continues to have a left lower extremity wound. He tolerated the wraps well. He has his compression stockings today. He denies signs of ?infection. ?11/14; patient presents for follow-up. He has his compression stockings today. He has no issues or complaints. ?Readmission 01/27/2022 ?Mr. Chayton Murata is a 61 year old male with a past medical history of CVA, hypertension, uncontrolled type 2 diabetes on insulin complicated by peripheral ?neuropathy, chronic venous insufficiency/lymphedema, diastolic CHF and left lower extremity DVT that presents to the clinic for bilateral lower extremity ?wounds. He states this started less than a week ago after scrubbing his legs in the shower. He has compression stockings that he reports using daily. He has ?been keeping the wounds covered. He denies signs of infection. ?Electronic Signature(s) ?Signed: 01/27/2022 12:11:35 PM By: [name] DO ?Signed: 01/27/2022 12:11:35 PM By: [name]  DO ?Entered By: [name] on 01/27/2022 12:04:51 ?-------------------------------------------------------------------------------- ?Physical Exam Details ?Patient Name: Date of Service: ?[name], Antawn L. 01/27/2022  9:00 A M ?Medical Record Number: [id number] ?Patient Account Number: [id number] ?Date of Birth/Sex: Treating RN: ?11-09-1960 (61 y.o. M) Rolin Barry, Bobbi ?Primary Care Provider: [name] Other Clinician: ?Referring Provider: ?Treating Provider/Extender: [name] ?[name] ?Weeks in Treatment: 0 ?Constitutional ?respirations regular, non-labored and within target range for patient.Marland Kitchen ?Cardiovascular ?2+ dorsalis pedis/posterior tibialis pulses. ?Psychiatric ?pleasant and cooperative. ?Notes ?Bilateral lower extremity wounds with granulation tissue throughout. 2+ pitting edema to the knees. No signs of surrounding infection. ?Electronic Signature(s) ?Signed: 01/27/2022 12:11:35 PM By: [name] DO ?Entered By: [name] on 01/27/2022 12:05:31 ?-------------------------------------------------------------------------------- ?Physician Orders Details ?Patient Name: Date of Service: ?[name], Prem L. 01/27/2022 9:00 A M ?Medical Record Number: [id number] ?Patient Account Number: [id number] ?Date of Birth/Sex: Treating RN: ?05-10-1961 (61 y.o. M) Rolin Barry, Bobbi ?Primary Care Provider: [name] Other Clinician: ?Referring Provider: ?Treating Provider/Extender: [name] ?[name] ?Weeks in Treatment: 0 ?Verbal / Phone Orders: No ?Diagnosis Coding ?Follow-up Appointments ?ppointment in 1 week. - Dr. Heber Arab (Dr. Dellia Nims covering) [place], Room 8 02/03/2022 1115 ?Return A ?Other: - [place]- to call and offer pricing for juxtalite HD compression garments. ?Licensed conveyancer ?May shower with protection but do not get wound dressing(s) wet. ?Edema Control - Lymphedema / SCD / Other ?Elevate legs to the level of the heart or above for 30 minutes daily and/or when  sitting, a frequency of: - throughout the day 3-4 times a day. ?Avoid standing for long periods of time. ?Exercise regularly ?Wound Treatment ?Wound #3 - Lower Leg Wound Laterality: Right, Posterior ?Cleanser: Soap and Water 1 x Per Week/30 Days ?Discharge Instructions: May shower and wash wound with dial antibacterial soap and water prior to dressing change. ?Cleanser: Wound Cleanser 1 x Per Week/30 Days ?Discharge Instructions: Cleanse the wound with wound cleanser prior to applying a clean dressing using gauze sponges, not tissue or cotton balls. ?Peri-Wound Care: Triamcinolone 15 (g) 1 x Per Week/30 Days ?Discharge Instructions: Use triamcinolone 15 (g) as directed ?Peri-Wound Care: Sween Lotion (Moisturizing lotion) 1 x Per Week/30 Days ?Discharge Instructions: Apply moisturizing lotion as directed ?Prim Dressing: KerraCel Ag Gelling Fiber Dressing, 4x5 in (silver alginate) 1 x Per Week/30 Days ?ary ?Discharge Instructions: Apply silver alginate to wound bed as instructed ?Secondary Dressing: ABD Pad, 8x10 1 x Per Week/30 Days ?Discharge Instructions: Apply over primary dressing as directed. ?Compression Wrap: FourPress (4 layer compression wrap) 1 x Per Week/30 Days ?Discharge Instructions: Apply four layer compression as directed. May also use Miliken CoFlex 2 layer compression system as ?alternative. ?Compression Stockings: Circaid Juxta Lite Compression Wrap (DME) ?Right Leg Compression Amount: 30-40 mmHG ?Discharge Instructions: Apply Circaid Juxta Lite Compression Wrap daily as instructed. Apply first thing in the morning, remove at ?night before bed. ?Wound #4 - Lower Leg Wound Laterality: Left, Medial ?Cleanser: Soap and Water 1 x Per Week/30 Days ?Discharge Instructions: May shower and wash wound with dial antibacterial soap and water prior to dressing change. ?Cleanser: Wound Cleanser 1 x Per Week/30 Days ?Discharge Instructions: Cleanse the wound with wound cleanser prior to applying a clean dressing  using gauze sponges, not tissue or cotton balls. ?Peri-Wound Care: Triamcinolone 15 (g) 1 x Per Week/30 Days ?Discharge Instructions: Use triamcinolone 15 (g) as directed ?Peri-Wound Care: Maude Leriche (Moisturizi

## 2022-01-28 ENCOUNTER — Inpatient Hospital Stay: Payer: Medicaid Other | Attending: Oncology

## 2022-01-28 ENCOUNTER — Other Ambulatory Visit: Payer: Self-pay

## 2022-01-28 ENCOUNTER — Inpatient Hospital Stay: Payer: Medicaid Other | Admitting: Oncology

## 2022-01-28 VITALS — BP 139/67 | HR 89 | Temp 98.2°F | Resp 20 | Wt 340.3 lb

## 2022-01-28 DIAGNOSIS — D500 Iron deficiency anemia secondary to blood loss (chronic): Secondary | ICD-10-CM | POA: Diagnosis not present

## 2022-01-28 DIAGNOSIS — D509 Iron deficiency anemia, unspecified: Secondary | ICD-10-CM

## 2022-01-28 DIAGNOSIS — Z7901 Long term (current) use of anticoagulants: Secondary | ICD-10-CM | POA: Diagnosis not present

## 2022-01-28 DIAGNOSIS — I2699 Other pulmonary embolism without acute cor pulmonale: Secondary | ICD-10-CM

## 2022-01-28 DIAGNOSIS — Z79899 Other long term (current) drug therapy: Secondary | ICD-10-CM | POA: Diagnosis not present

## 2022-01-28 DIAGNOSIS — Z86711 Personal history of pulmonary embolism: Secondary | ICD-10-CM | POA: Diagnosis present

## 2022-01-28 DIAGNOSIS — N179 Acute kidney failure, unspecified: Secondary | ICD-10-CM | POA: Insufficient documentation

## 2022-01-28 DIAGNOSIS — Z886 Allergy status to analgesic agent status: Secondary | ICD-10-CM | POA: Insufficient documentation

## 2022-01-28 DIAGNOSIS — K921 Melena: Secondary | ICD-10-CM | POA: Diagnosis not present

## 2022-01-28 LAB — CMP (CANCER CENTER ONLY)
ALT: 19 U/L (ref 0–44)
AST: 22 U/L (ref 15–41)
Albumin: 3.9 g/dL (ref 3.5–5.0)
Alkaline Phosphatase: 40 U/L (ref 38–126)
Anion gap: 11 (ref 5–15)
BUN: 17 mg/dL (ref 6–20)
CO2: 28 mmol/L (ref 22–32)
Calcium: 9.1 mg/dL (ref 8.9–10.3)
Chloride: 102 mmol/L (ref 98–111)
Creatinine: 0.94 mg/dL (ref 0.61–1.24)
GFR, Estimated: >60 mL/min (ref >60)
Glucose, Bld: 196 mg/dL — ABNORMAL HIGH (ref 70–99)
Potassium: 3.5 mmol/L (ref 3.5–5.1)
Sodium: 141 mmol/L (ref 135–145)
Total Bilirubin: 0.5 mg/dL (ref 0.3–1.2)
Total Protein: 7.2 g/dL (ref 6.5–8.1)

## 2022-01-28 LAB — CBC WITH DIFFERENTIAL (CANCER CENTER ONLY)
Abs Immature Granulocytes: 0.02 10*3/uL (ref 0.00–0.07)
Basophils Absolute: 0 10*3/uL (ref 0.0–0.1)
Basophils Relative: 0 %
Eosinophils Absolute: 0.1 10*3/uL (ref 0.0–0.5)
Eosinophils Relative: 2 %
HCT: 32.3 % — ABNORMAL LOW (ref 39.0–52.0)
Hemoglobin: 10.7 g/dL — ABNORMAL LOW (ref 13.0–17.0)
Immature Granulocytes: 0 %
Lymphocytes Relative: 25 %
Lymphs Abs: 1.7 10*3/uL (ref 0.7–4.0)
MCH: 30.4 pg (ref 26.0–34.0)
MCHC: 33.1 g/dL (ref 30.0–36.0)
MCV: 91.8 fL (ref 80.0–100.0)
Monocytes Absolute: 0.6 10*3/uL (ref 0.1–1.0)
Monocytes Relative: 9 %
Neutro Abs: 4.2 10*3/uL (ref 1.7–7.7)
Neutrophils Relative %: 64 %
Platelet Count: 181 10*3/uL (ref 150–400)
RBC: 3.52 MIL/uL — ABNORMAL LOW (ref 4.22–5.81)
RDW: 13.8 % (ref 11.5–15.5)
WBC Count: 6.7 10*3/uL (ref 4.0–10.5)
nRBC: 0 % (ref 0.0–0.2)

## 2022-01-28 NOTE — Progress Notes (Signed)
Hematology and Oncology Follow Up Visit ? ?[name] ?[id number] ?Jan 26, 1961 61 y.o. ?01/28/2022 8:50 AM ?Abel Presto, Shawnee Knapp, FNP  ? 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   ? ? ? ? ?Current therapy: Full dose anticoagulation with Eliquis started in September 2022. ? ?Interim History: Mr. Cowsert returns today for a follow-up evaluation.  Since last visit, he was hospitalized in March 2023 after presenting with altered mental status and acute kidney injury.  Since his discharge, he reports feeling well and continues to have intermittent hematochezia.  He denies any recent shortness of breath or chest pain.  He denies any abdominal pain or changes in his bowels. ? ? ? ? ?Medications: Updated on review. ?Current Outpatient Medications  ?Medication Sig Dispense Refill  ? Accu-Chek FastClix Lancets MISC 1 Device by Other route 2 (two) times daily.     ? ACCU-CHEK GUIDE test strip 1 each by Other route 2 (two) times daily.    ? albuterol (VENTOLIN HFA) 108 (90 Base) MCG/ACT inhaler Inhale 2 puffs into the lungs every 6 (six) hours as needed for wheezing or shortness of breath.    ? apixaban (ELIQUIS) 5 MG TABS tablet Take 2 tablets ('10mg'$ ) twice daily for 7 days, then 1 tablet ('5mg'$ ) twice daily (Patient taking differently: Take 5 mg by mouth 2 (two) times daily.) 60 tablet 0  ? atorvastatin (LIPITOR) 40 MG tablet Take 40 mg by mouth at bedtime.    ? carvedilol (COREG) 12.5 MG tablet Take 12.5 mg by mouth 2 (two) times daily.    ? cyanocobalamin 1000 MCG tablet Take by mouth.    ? diclofenac (FLECTOR) 1.3 % PTCH Place 1 patch onto the skin 2 (two) times daily as needed (back and knee pain).    ? fenofibrate (TRICOR) 145 MG tablet Take 1 tablet (145 mg total) by mouth daily. (Patient taking differently: Take 145 mg by mouth every morning.) 90 tablet 2  ? furosemide (LASIX) 40 MG tablet Take 1 tablet (40 mg total) by mouth  3 (three) times daily.    ? gabapentin (NEURONTIN) 800 MG tablet Take 800 mg by mouth 3 (three) times daily.    ? glipiZIDE (GLUCOTROL) 10 MG tablet Take 10 mg by mouth 2 (two) times daily.  4  ? HUMULIN R U-500 KWIKPEN 500 UNIT/ML kwikpen Inject 0-110 Units into the skin See admin instructions. Per sliding scale 3 times daily with meals    ? Insulin Pen Needle 31G X 5 MM MISC Use 1 needle daily to inject insulin as prescribed 100 each 2  ? isosorbide mononitrate (IMDUR) 30 MG 24 hr tablet TAKE 1 TABLET BY MOUTH DAILY (Patient taking differently: Take 30 mg by mouth daily.) 28 tablet 12  ? Menthol, Topical Analgesic, (BIOFREEZE EX) Apply 1 application topically 2 (two) times daily as needed (knee pain).    ? nitroGLYCERIN (NITROSTAT) 0.4 MG SL tablet Place 1 tablet (0.4 mg total) under the tongue every 5 (five) minutes as needed for chest pain. 100 tablet 1  ? ondansetron (ZOFRAN) 4 MG tablet Take 4 mg by mouth 2 (two) times daily as needed for nausea/vomiting.    ? Oxycodone HCl 20 MG TABS Take 20 mg by mouth 4 (four) times daily as needed (pain).    ? pantoprazole (PROTONIX) 40 MG tablet Take 40 mg by mouth every morning.    ? potassium chloride (MICRO-K) 10 MEQ CR capsule Take 20 mEq  by mouth 2 (two) times daily.    ? sertraline (ZOLOFT) 50 MG tablet Take 50 mg by mouth every morning.    ? tiZANidine (ZANAFLEX) 4 MG tablet Take 4 mg by mouth at bedtime as needed for muscle spasms.    ? traZODone (DESYREL) 50 MG tablet Take 1 tablet (50 mg total) by mouth at bedtime. 30 tablet 11  ? Vitamin D, Ergocalciferol, (DRISDOL) 1.25 MG (50000 UT) CAPS capsule Take 50,000 Units by mouth every Monday.    ? ?No current facility-administered medications for this visit.  ? ? ? ?Allergies:  ?Allergies  ?Allergen Reactions  ? Coconut Flavor [Flavoring Agent] Hives  ? Coconut Oil Hives  ? Ibuprofen Other (See Comments)  ?  Made gastric ulcers worse  ? Aleve [Naproxen] Other (See Comments)  ?  DUE TO KIDNEYS  ? Nsaids Other (See  Comments)  ?  Stomach ulcers ?  ? Other Hives  ?  Nut Allergy  ? Vascepa [Icosapent Ethyl] Other (See Comments)  ?  headaches, chest pain, similar to sx of a stroke, hypotension   ? ? ? ? ?Physical Exam: ?Blood pressure 139/67, pulse 89, temperature 98.2 ?F (36.8 ?C), resp. rate 20, weight (!) 340 lb 4.8 oz (154.4 kg), SpO2 91 %. ? ? ?ECOG: 1 ? ? ? ?General appearance: Alert, awake without any distress. ?Head: Atraumatic without abnormalities ?Oropharynx: Without any thrush or ulcers. ?Eyes: No scleral icterus. ?Lymph nodes: No lymphadenopathy noted in the cervical, supraclavicular, or axillary nodes ?Heart:regular rate and rhythm, without any murmurs or gallops.   ?Lung: Clear to auscultation without any rhonchi, wheezes or dullness to percussion. ?Abdomin: Soft, nontender without any shifting dullness or ascites. ?Musculoskeletal: No clubbing or cyanosis. ?Neurological: No motor or sensory deficits. ?Skin: No rashes or lesions. ? ? ? ? ? ?Lab Results: ?Lab Results  ?Component Value Date  ? WBC 8.6 01/13/2022  ? HGB 12.2 (L) 01/13/2022  ? HCT 36.4 (L) 01/13/2022  ? MCV 92.6 01/13/2022  ? PLT 199 01/13/2022  ? ?  Chemistry   ?   ?Component Value Date/Time  ? NA 136 01/13/2022 0233  ? NA 145 (H) 12/27/2021 0950  ? K 4.3 01/13/2022 0233  ? CL 102 01/13/2022 0233  ? CO2 24 01/13/2022 0233  ? BUN 27 (H) 01/13/2022 0233  ? BUN 14 12/27/2021 0950  ? CREATININE 1.81 (H) 01/13/2022 0233  ? CREATININE 0.74 03/08/2019 1008  ?    ?Component Value Date/Time  ? CALCIUM 9.8 01/13/2022 0233  ? ALKPHOS 38 01/12/2022 0401  ? AST 30 01/12/2022 0401  ? ALT 15 01/12/2022 0401  ? BILITOT 1.0 01/12/2022 0401  ? BILITOT 0.5 10/17/2019 0836  ?  ? ? ? ? ?Impression and Plan: ? ?61 year old with: ? ?1.  Pulmonary embolism diagnosed in September 2022.  This was thought to be provoked by COVID-19 infection, immobility and obesity. ? ?The natural course of this disease was reviewed at this time and treatment choices were discussed at this time.   Risks and benefits of continuing Eliquis versus discontinuation were discussed.  He has completed 6 months of anticoagulation for a provoked thrombosis.  Risk of recurrent thrombosis was also discussed today in detail versus the risk of continuing anticoagulation and GI bleeding. ? ?After discussion today, we opted to discontinue Eliquis at this time and he will take low-dose aspirin moving forward. ?  ?  ?2.  GI bleeding: Appears to have resolved at this time with work-up unremarkable.   He  had a colonoscopy and endoscopy in September 2022.  This is exacerbated by his anticoagulation which should improve. ?  ?  ?3.  Anemia: Related to GI bleeding with the hemoglobin is adequate at this time.  We will continue to monitor and replace iron as needed. ?  ?4.  Follow-up: In 6 months for repeat evaluation to assess his status off anticoagulation.   ? ? ?30 minutes were dedicated to this visit.  The time was spent on reviewing laboratory data, disease status update and outlining future plan of care discussion. ?  ?  ? ? ?Zola Button, MD ?4/7/20238:50 AM ? ?

## 2022-02-01 ENCOUNTER — Encounter: Payer: Medicaid Other | Admitting: Physical Therapy

## 2022-02-02 ENCOUNTER — Encounter: Payer: Self-pay | Admitting: *Deleted

## 2022-02-02 DIAGNOSIS — Z006 Encounter for examination for normal comparison and control in clinical research program: Secondary | ICD-10-CM

## 2022-02-02 NOTE — Research (Signed)
Message left for Mr Eisenhour about essence research study. Encouraged him to call me back.  ?

## 2022-02-03 ENCOUNTER — Encounter: Payer: Self-pay | Admitting: *Deleted

## 2022-02-03 ENCOUNTER — Encounter: Payer: Medicaid Other | Admitting: Physical Therapy

## 2022-02-03 ENCOUNTER — Encounter (HOSPITAL_BASED_OUTPATIENT_CLINIC_OR_DEPARTMENT_OTHER): Payer: Medicaid Other | Admitting: Internal Medicine

## 2022-02-03 DIAGNOSIS — Z09 Encounter for follow-up examination after completed treatment for conditions other than malignant neoplasm: Secondary | ICD-10-CM | POA: Diagnosis not present

## 2022-02-03 DIAGNOSIS — Z006 Encounter for examination for normal comparison and control in clinical research program: Secondary | ICD-10-CM

## 2022-02-03 NOTE — Research (Signed)
Spoke with [name] about Designer, multimedia. He states that he would like more information. Emailed him the consent to review. Encouraged him to cal me with any questions.  ?

## 2022-02-04 NOTE — Progress Notes (Signed)
Toppins, Tekoa L. ([id number]) ?Visit Report for 02/03/2022 ?Arrival Information Details ?Patient Name: Date of Service: ?[name], [name]. 02/03/2022 11:15 A M ?Medical Record Number: [id number] ?Patient Account Number: [id number] ?Date of Birth/Sex: Treating RN: ?11/17/1960 (61 y.o. Burnadette Pop, Lauren ?Primary Care Shloima Clinch: [name] Other Clinician: ?Referring Leelyn Jasinski: ?Treating Dawnyel Leven/Extender: Linton Ham ?[name] ?Weeks in Treatment: 1 ?Visit Information History Since Last Visit ?Added or deleted any medications: No ?Patient Arrived: Ambulatory ?Any new allergies or adverse reactions: No ?Arrival Time: 11:09 ?Had a fall or experienced change in No ?Accompanied By: self ?activities of daily living that may affect ?Transfer Assistance: None ?risk of falls: ?Patient Identification Verified: Yes ?Signs or symptoms of abuse/neglect since last visito No ?Secondary Verification Process Completed: Yes ?Hospitalized since last visit: No ?Patient Requires Transmission-Based No ?Implantable device outside of the clinic excluding No ?Precautions: ?cellular tissue based products placed in the center ?Patient Has Alerts: Yes ?since last visit: ?Patient Alerts: Patient on Blood Thinner ?Has Dressing in Place as Prescribed: Yes ?09/06/21 ABI L1.01 office ?Has Compression in Place as Prescribed: Yes ?09/06/21 ABI R1.02 office ?Pain Present Now: Yes ?Electronic Signature(s) ?Signed: 02/04/2022 12:54:41 PM By: Rhae Hammock RN ?Entered By: Rhae Hammock on 02/03/2022 11:12:08 ?-------------------------------------------------------------------------------- ?Compression Therapy Details ?Patient Name: Date of Service: ?[name], [name]. 02/03/2022 11:15 A M ?Medical Record Number: [id number] ?Patient Account Number: [id number] ?Date of Birth/Sex: Treating RN: ?14-Jan-1961 (61 y.o. M) Rolin Barry, Bobbi ?Primary Care Davita Sublett: [name] Other Clinician: ?Referring Charleigh Correnti: ?Treating Aleksia Freiman/Extender: Linton Ham ?[name] ?Weeks in Treatment: 1 ?Compression Therapy Performed for Wound Assessment: Wound #3 Right,Posterior Lower Leg ?Performed By: Clinician Deon Pilling, RN ?Compression Type: Double Layer ?Post Procedure Diagnosis ?Same as Pre-procedure ?Electronic Signature(s) ?Signed: 02/04/2022 1:08:25 PM By: Deon Pilling RN, BSN ?Entered By: Deon Pilling on 02/03/2022 11:42:52 ?-------------------------------------------------------------------------------- ?Compression Therapy Details ?Patient Name: ?Date of Service: ?[name], [name] 02/03/2022 11:15 A M ?Medical Record Number: [id number] ?Patient Account Number: [id number] ?Date of Birth/Sex: ?Treating RN: ?July 17, 1961 (61 y.o. M) Rolin Barry, Bobbi ?Primary Care Jp Eastham: [name] ?Other Clinician: ?Referring Cristyn Crossno: ?Treating Maridee Slape/Extender: Linton Ham ?[name] ?Weeks in Treatment: 1 ?Compression Therapy Performed for Wound Assessment: Wound #4 Left,Medial Lower Leg ?Performed By: Clinician Deon Pilling, RN ?Compression Type: Double Layer ?Post Procedure Diagnosis ?Same as Pre-procedure ?Electronic Signature(s) ?Signed: 02/04/2022 1:08:25 PM By: Deon Pilling RN, BSN ?Entered By: Deon Pilling on 02/03/2022 11:42:52 ?-------------------------------------------------------------------------------- ?Encounter Discharge Information Details ?Patient Name: ?Date of Service: ?[name], [name] 02/03/2022 11:15 A M ?Medical Record Number: [id number] ?Patient Account Number: [id number] ?Date of Birth/Sex: ?Treating RN: ?02-04-1961 (61 y.o. M) Rolin Barry, Bobbi ?Primary Care Sahvannah Rieser: [name] ?Other Clinician: ?Referring Lottie Sigman: ?Treating Miaisabella Bacorn/Extender: Linton Ham ?[name] ?Weeks in Treatment: 1 ?Encounter Discharge Information Items ?Discharge Condition: Stable ?Ambulatory Status: Ambulatory ?Discharge Destination: Home ?Transportation: Other ?Accompanied By: self ?Schedule Follow-up Appointment: Yes ?Clinical Summary of  Care: ?Electronic Signature(s) ?Signed: 02/04/2022 1:08:25 PM By: Deon Pilling RN, BSN ?Entered By: Deon Pilling on 02/03/2022 12:02:00 ?-------------------------------------------------------------------------------- ?Lower Extremity Assessment Details ?Patient Name: ?Date of Service: ?[name], [name] 02/03/2022 11:15 A M ?Medical Record Number: [id number] ?Patient Account Number: [id number] ?Date of Birth/Sex: ?Treating RN: ?19-Apr-1961 (61 y.o. Burnadette Pop, Lauren ?Primary Care Alexandrina Fiorini: [name] ?Other Clinician: ?Referring Marget Outten: ?Treating Caid Radin/Extender: Linton Ham ?[name] ?Weeks in Treatment: 1 ?Edema Assessment ?Assessed: [Left: No] [Right: No] ?Edema: [Left: Yes] [Right: Yes] ?Calf ?Left: Right: ?Point of Measurement: 33 cm From Medial Instep 47 cm 45 cm ?Ankle ?Left: Right: ?Point  of Measurement: 11 cm From Medial Instep 27 cm 45 cm ?Vascular Assessment ?Pulses: ?Dorsalis Pedis ?Palpable: [Left:Yes] [Right:Yes] ?Posterior Tibial ?Palpable: [Left:Yes] [Right:Yes] ?Electronic Signature(s) ?Signed: 02/04/2022 12:54:41 PM By: Rhae Hammock RN ?Entered By: Rhae Hammock on 02/03/2022 11:19:15 ?-------------------------------------------------------------------------------- ?Multi Wound Chart Details ?Patient Name: ?Date of Service: ?[name], [name] 02/03/2022 11:15 A M ?Medical Record Number: [id number] ?Patient Account Number: [id number] ?Date of Birth/Sex: ?Treating RN: ?10-Apr-1961 (61 y.o. M) Rolin Barry, Bobbi ?Primary Care Dnasia Gauna: [name] ?Other Clinician: ?Referring Lacee Grey: ?Treating Justene Jensen/Extender: Linton Ham ?[name] ?Weeks in Treatment: 1 ?Vital Signs ?Height(in): 70 ?Capillary Blood Glucose(mg/dl): 202 ?Weight(lbs): 331 ?Pulse(bpm): 80 ?Body Mass Index(BMI): 47.5 ?Blood Pressure(mmHg): 130/74 ?Temperature(??F): 98.7 ?Respiratory Rate(breaths/min): 17 ?Photos: [N/A:N/A] ?Right, Posterior Lower Leg Left, Medial Lower Leg N/A ?Wound Location: ?Trauma  Trauma N/A ?Wounding Event: ?Lymphedema Lymphedema N/A ?Primary Etiology: ?Diabetic Wound/Ulcer of the Lower Diabetic Wound/Ulcer of the Lower N/A ?Secondary Etiology: ?Extremity Extremity ?Congestive Heart Failure, Coronary Congestive Heart Failure, Coronary N/A ?Comorbid History: ?Artery Disease, Deep Vein Artery Disease, Deep Vein ?Thrombosis, Hypertension, Thrombosis, Hypertension, ?Hypotension, Type II Diabetes, Hypotension, Type II Diabetes, ?Neuropathy Neuropathy ?01/25/2022 01/20/2022 N/A ?Date Acquired: ?1 1 N/A ?Weeks of Treatment: ?Open Open N/A ?Wound Status: ?No No N/A ?Wound Recurrence: ?No Yes N/A ?Clustered Wound: ?4.4x5x0.1 0.1x0.1x0.1 N/A ?Measurements L x W x D (cm) ?17.279 0.008 N/A ?A (cm?) : ?rea ?1.728 0.001 N/A ?Volume (cm?) : ?12.00% 99.90% N/A ?% Reduction in Area: ?12.00% 99.80% N/A ?% Reduction in Volume: ?Full Thickness Without Exposed Full Thickness Without Exposed N/A ?Classification: ?Support Structures Support Structures ?Medium Medium N/A ?Exudate Amount: ?Serosanguineous Serosanguineous N/A ?Exudate Type: ?red, brown red, brown N/A ?Exudate Color: ?Distinct, outline attached Distinct, outline attached N/A ?Wound Margin: ?Large (67-100%) Large (67-100%) N/A ?Granulation Amount: ?Red Red, Pink, Pale, Friable N/A ?Granulation Quality: ?Small (1-33%) None Present (0%) N/A ?Necrotic Amount: ?Fat Layer (Subcutaneous Tissue): Yes Fat Layer (Subcutaneous Tissue): Yes N/A ?Exposed Structures: ?Fascia: No ?Fascia: No ?Tendon: No ?Tendon: No ?Muscle: No ?Muscle: No ?Joint: No ?Joint: No ?Bone: No ?Bone: No ?Small (1-33%) Large (67-100%) N/A ?Epithelialization: ?Compression Therapy Compression Therapy N/A ?Procedures Performed: ?Treatment Notes ?Electronic Signature(s) ?Signed: 02/03/2022 4:28:39 PM By: Linton Ham MD ?Signed: 02/04/2022 1:08:25 PM By: Deon Pilling RN, BSN ?Entered By: Linton Ham on 02/03/2022  11:46:24 ?-------------------------------------------------------------------------------- ?Multi-Disciplinary Care Plan Details ?Patient Name: ?Date of Service: ?[name], [name] 02/03/2022 11:15 A M ?Medical Record Number: [id number] ?Patient Account Number: [id number] ?Date of Birth

## 2022-02-04 NOTE — Progress Notes (Signed)
Stein, [name] ([id number]) ?Visit Report for 02/03/2022 ?HPI Details ?Patient Name: Date of Service: ?[name], [name]. 02/03/2022 11:15 A M ?Medical Record Number: [id number] ?Patient Account Number: [id number] ?Date of Birth/Sex: Treating RN: ?Oct 06, 1961 (61 y.o. M) [name], Bobbi ?Primary Care Provider: [name] Other Clinician: ?Referring Provider: ?Treating Provider/Extender: Linton Ham ?[name] ?Weeks in Treatment: 1 ?History of Present Illness ?HPI Description: Admission 08/16/2021 ?Mr. Odarius Dines is a 61 year old male with a past medical history of CVA, hypertension, uncontrolled type 2 diabetes on insulin complicated by peripheral ?neuropathy, Chronic venous insufficiency/lymphedema, diastolic CHF, and left lower extremity DVT that presents to clinic today for an open wound to his right ?lower extremity. He reports an ongoing issue with blistering to his legs and weeping that wax and wane over the past 6 months. He was previously seen in our ?clinic in June 2022 however he had closed wounds at that time. He reported that his diuretic was increased then and helped with symptom control. About 2 ?months ago he developed increased blistering and swelling again. He reports wearing 20/30 compression stockings however it is unclear if he is doing this daily ?since he reports the stockings are very difficult to put on. He currently denies signs of infection. ?10/31; patient presents for follow-up. He has tolerated the compression wraps well. The right leg wound has healed. He does have a small open wound now ?present on the left anterior lower extremity limited to skin breakdown. He would like to continue wrapping both legs. He states he cannot afford juxta lite ?compression and would like to use his compression stockings he currently owns. He denies signs of infection. ?11/7; patient presents for follow-up. He has no issues or complaints today. While in office he scratched With his fingers at  his right leg and now has a small ?open wound. He continues to have a left lower extremity wound. He tolerated the wraps well. He has his compression stockings today. He denies signs of ?infection. ?11/14; patient presents for follow-up. He has his compression stockings today. He has no issues or complaints. ?Readmission 01/27/2022 ?Mr. Rourke Mcquitty is a 61 year old male with a past medical history of CVA, hypertension, uncontrolled type 2 diabetes on insulin complicated by peripheral ?neuropathy, chronic venous insufficiency/lymphedema, diastolic CHF and left lower extremity DVT that presents to the clinic for bilateral lower extremity ?wounds. He states this started less than a week ago after scrubbing his legs in the shower. He has compression stockings that he reports using daily. He has ?been keeping the wounds covered. He denies signs of infection. ?4/13; patient returns to clinic today for treatment of his chronic venous insufficiency and lymphedema wounds on the left anterior mid tibia and the right ?posterior calf. We have been using silver alginate ?Electronic Signature(s) ?Signed: 02/03/2022 4:28:39 PM By: Linton Ham MD ?Entered By: Linton Ham on 02/03/2022 12:25:50 ?-------------------------------------------------------------------------------- ?Physical Exam Details ?Patient Name: Date of Service: ?[name], [name]. 02/03/2022 11:15 A M ?Medical Record Number: [id number] ?Patient Account Number: [id number] ?Date of Birth/Sex: Treating RN: ?17-Dec-1960 (61 y.o. M) [name], Bobbi ?Primary Care Provider: [name] Other Clinician: ?Referring Provider: ?Treating Provider/Extender: Linton Ham ?[name] ?Weeks in Treatment: 1 ?Constitutional ?Sitting or standing Blood Pressure is within target range for patient.. Pulse regular and within target range for patient.Marland Kitchen Respirations regular, non-labored and ?within target range.. Temperature is normal and within the target range for the  patient.Marland Kitchen Appears in no distress. ?Cardiovascular ?Pedal pulses palpable and strong bilaterally.. Good edema control. ?Notes ?  Wound exam; bilateral lower extremity wounds the area on the left anterior mid tibia actually looks close to me. Nevertheless I am going to keep his leg in ?compression to ensure closure. The area on the right posterior calf is not closed however it is smaller and looks healthy no debridement is required. His edema ?control is quite good ?Electronic Signature(s) ?Signed: 02/03/2022 4:28:39 PM By: Linton Ham MD ?Entered By: Linton Ham on 02/03/2022 12:29:34 ?-------------------------------------------------------------------------------- ?Physician Orders Details ?Patient Name: ?Date of Service: ?[name], [name] 02/03/2022 11:15 A M ?Medical Record Number: [id number] ?Patient Account Number: [id number] ?Date of Birth/Sex: ?Treating RN: ?Aug 26, 1961 (61 y.o. M) [name], Bobbi ?Primary Care Provider: [name] ?Other Clinician: ?Referring Provider: ?Treating Provider/Extender: Linton Ham ?[name] ?Weeks in Treatment: 1 ?Verbal / Phone Orders: No ?Diagnosis Coding ?ICD-10 Coding ?Code Description ?X90.240 Non-pressure chronic ulcer of other part of right lower leg with fat layer exposed ?X73.532 Non-pressure chronic ulcer of other part of left lower leg with fat layer exposed ?I89.0 Lymphedema, not elsewhere classified ?I87.2 Venous insufficiency (chronic) (peripheral) ?I87.313 Chronic venous hypertension (idiopathic) with ulcer of bilateral lower extremity ?E11.622 Type 2 diabetes mellitus with other skin ulcer ?Follow-up Appointments ?ppointment in 1 week. - Dr. Heber Cannondale (Dr. Dellia Nims covering) [place], Room 8 02/10/2022 1115 ?Return A ?Other: - Call and purchase 10-55mHg compression stockings. Bring in both 10-141mg and 20-3082m stockings for left leg next week. ?Bathing/ Shower/ Hygiene ?May shower with protection but do not get wound dressing(s) wet. ?Edema Control -  Lymphedema / SCD / Other ?Elevate legs to the level of the heart or above for 30 minutes daily and/or when sitting, a frequency of: - throughout the day 3-4 times a day. ?Avoid standing for long periods of time. ?Exercise regularly ?Wound Treatment ?Wound #3 - Lower Leg Wound Laterality: Right, Posterior ?Cleanser: Soap and Water 1 x Per Week/30 Days ?Discharge Instructions: May shower and wash wound with dial antibacterial soap and water prior to dressing change. ?Cleanser: Wound Cleanser 1 x Per Week/30 Days ?Discharge Instructions: Cleanse the wound with wound cleanser prior to applying a clean dressing using gauze sponges, not tissue or cotton balls. ?Peri-Wound Care: Triamcinolone 15 (g) 1 x Per Week/30 Days ?Discharge Instructions: Use triamcinolone 15 (g) as directed ?Peri-Wound Care: Sween Lotion (Moisturizing lotion) 1 x Per Week/30 Days ?Discharge Instructions: Apply moisturizing lotion as directed ?Prim Dressing: KerraCel Ag Gelling Fiber Dressing, 4x5 in (silver alginate) 1 x Per Week/30 Days ?ary ?Discharge Instructions: Apply silver alginate to wound bed as instructed ?Secondary Dressing: ABD Pad, 8x10 1 x Per Week/30 Days ?Discharge Instructions: Apply over primary dressing as directed. ?Compression Wrap: FourPress (4 layer compression wrap) 1 x Per Week/30 Days ?Discharge Instructions: Apply four layer compression as directed. May also use Miliken CoFlex 2 layer compression system as ?alternative. ?Compression Stockings: Circaid Juxta Lite Compression Wrap ?Right Leg Compression Amount: 30-40 mmHG ?Discharge Instructions: Apply Circaid Juxta Lite Compression Wrap daily as instructed. Apply first thing in the morning, remove at ?night before bed. ?Wound #4 - Lower Leg Wound Laterality: Left, Medial ?Cleanser: Soap and Water 1 x Per Week/30 Days ?Discharge Instructions: May shower and wash wound with dial antibacterial soap and water prior to dressing change. ?Cleanser: Wound Cleanser 1 x Per Week/30  Days ?Discharge Instructions: Cleanse the wound with wound cleanser prior to applying a clean dressing using gauze sponges, not tissue or cotton balls. ?Peri-Wound Care: Triamcinolone 15 (g) 1 x Per Week/30 Days ?D

## 2022-02-07 ENCOUNTER — Ambulatory Visit (INDEPENDENT_AMBULATORY_CARE_PROVIDER_SITE_OTHER): Payer: Medicaid Other | Admitting: Podiatry

## 2022-02-07 ENCOUNTER — Encounter: Payer: Self-pay | Admitting: Podiatry

## 2022-02-07 DIAGNOSIS — E1142 Type 2 diabetes mellitus with diabetic polyneuropathy: Secondary | ICD-10-CM | POA: Diagnosis not present

## 2022-02-07 DIAGNOSIS — B351 Tinea unguium: Secondary | ICD-10-CM | POA: Diagnosis not present

## 2022-02-07 DIAGNOSIS — N179 Acute kidney failure, unspecified: Secondary | ICD-10-CM

## 2022-02-07 DIAGNOSIS — M79675 Pain in left toe(s): Secondary | ICD-10-CM | POA: Diagnosis not present

## 2022-02-07 DIAGNOSIS — M79674 Pain in right toe(s): Secondary | ICD-10-CM

## 2022-02-07 DIAGNOSIS — G4733 Obstructive sleep apnea (adult) (pediatric): Secondary | ICD-10-CM | POA: Diagnosis not present

## 2022-02-07 NOTE — Progress Notes (Signed)
This patient presents  to my office for at risk foot care.  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  This patient presents for at risk foot care today. ? ?General Appearance  Alert, conversant and in no acute stress. ? ?Vascular  Dorsalis pedis   pulses are absent bilaterally. Posterior tibial pulses are absent due to swelling. Capillary return is within normal limits  bilaterally. Temperature is within normal limits  bilaterally.  Venous stasis  B/L. ? ?Neurologic  Senn-Weinstein monofilament wire test within normal limits  bilaterally. Muscle power within normal limits bilaterally. ? ?Nails Thick disfigured discolored nails with subungual debris  from hallux to fifth toes bilaterally. No evidence of bacterial infection or drainage bilaterally. ? ?Orthopedic  No limitations of motion  feet .  No crepitus or effusions noted.  No bony pathology or digital deformities noted. ? ?Skin  dry skin with no porokeratosis noted bilaterally.  No signs of infections or ulcers noted.    ? ?Onychomycosis  Pain in right toes  Pain in left toes ? ?Consent was obtained for treatment procedures.   Mechanical debridement of nails 1-5  bilaterally performed with a nail nipper.  Filed with dremel without incident.  ? ? ?Return office visit    3 months                 Told patient to return for periodic foot care and evaluation due to potential at risk complications. ? ? ?Gardiner Barefoot DPM  ?

## 2022-02-07 NOTE — Therapy (Incomplete)
?OUTPATIENT PHYSICAL THERAPY TREATMENT NOTE ? ? ?Patient Name: [name] ?MRN: [id number] ?DOB:1960-11-20, 61 y.o., male ?Today's Date: 02/07/2022 ? ?PCP: [name], FNP ?REFERRING PROVIDER: [name], FNP ? ?END OF SESSION:  ? ? ?Past Medical History:  ?Diagnosis Date  ? Anemia   ? Arthritis   ? Back pain   ? CAD (coronary artery disease)   ? a. s/p DES to LAD in 05/2016  ? Cervical radiculopathy   ? Chronic diastolic CHF (congestive heart failure) ([place])   ? Chronic pain   ? Depression   ? DVT (deep venous thrombosis) ([place])   ? Hematemesis   ? Hepatic steatosis   ? Hyperlipidemia   ? Hypertension   ? IBS (irritable bowel syndrome)   ? Morbid obesity ([place])   ? OSA (obstructive sleep apnea)   ? Pancreatitis   ? PE (pulmonary thromboembolism) ([place])   ? Peripheral neuropathy   ? PUD (peptic ulcer disease)   ? Renal disorder   ? Stroke [facility])   ? a. ?details unclear - not seen on imaging when he was admitted in 05/2017 for TIA symptoms which were felt due to cervical radiculopathy.  ? Thoracic aortic ectasia (HCC)   ? a. 4.3cm ectatic ascending thoracic aorta by CT 06/2017.   ? Type 2 diabetes mellitus ([place])   ? ?Past Surgical History:  ?Procedure Laterality Date  ? CARDIAC CATHETERIZATION N/A 05/31/2016  ? Procedure: Left Heart Cath and Coronary Angiography;  Surgeon: Peter M Martinique, MD;  Location: [place] [facility];  Service: Cardiovascular;  Laterality: N/A;  ? CARDIAC CATHETERIZATION N/A 05/31/2016  ? Procedure: Intravascular Pressure Wire/FFR Study;  Surgeon: Peter M Martinique, MD;  Location: [place] [facility];  Service: Cardiovascular;  Laterality: N/A;  ? CARDIAC CATHETERIZATION N/A 05/31/2016  ? Procedure: Coronary Stent Intervention;  Surgeon: Peter M Martinique, MD;  Location: [place] [facility];  Service: Cardiovascular;  Laterality: N/A;  ? COLONOSCOPY N/A 07/17/2021  ? Procedure: COLONOSCOPY;  Surgeon: Wilford Corner, MD;  Location: [facility];  Service: Endoscopy;  Laterality: N/A;  ? COLONOSCOPY  WITH PROPOFOL N/A 04/29/2020  ? Procedure: COLONOSCOPY WITH PROPOFOL;  Surgeon: Wilford Corner, MD;  Location: [place];  Service: Endoscopy;  Laterality: N/A;  ? ESOPHAGOGASTRODUODENOSCOPY N/A 04/29/2020  ? Procedure: ESOPHAGOGASTRODUODENOSCOPY (EGD);  Surgeon: Wilford Corner, MD;  Location: [place];  Service: Endoscopy;  Laterality: N/A;  ? ESOPHAGOGASTRODUODENOSCOPY (EGD) WITH PROPOFOL N/A 07/17/2021  ? Procedure: ESOPHAGOGASTRODUODENOSCOPY (EGD) WITH PROPOFOL;  Surgeon: Wilford Corner, MD;  Location: [facility];  Service: Endoscopy;  Laterality: N/A;  ? LEFT HEART CATH AND CORONARY ANGIOGRAPHY N/A 12/08/2017  ? Procedure: LEFT HEART CATH AND CORONARY ANGIOGRAPHY;  Surgeon: Leonie Man, MD;  Location: [place] [facility];  Service: Cardiovascular;  Laterality: N/A;  ? LEFT HEART CATHETERIZATION WITH CORONARY ANGIOGRAM N/A 02/03/2014  ? Procedure: LEFT HEART CATHETERIZATION WITH CORONARY ANGIOGRAM;  Surgeon: Pixie Casino, MD;  Location: [facility] CATH LAB;  Service: Cardiovascular;  Laterality: N/A;  ? left leg stent     ? POLYPECTOMY  04/29/2020  ? Procedure: POLYPECTOMY;  Surgeon: Wilford Corner, MD;  Location: [facility] ENDOSCOPY;  Service: Endoscopy;;  ? POLYPECTOMY  07/17/2021  ? Procedure: POLYPECTOMY;  Surgeon: Wilford Corner, MD;  Location: [facility] ENDOS[facility];  Service: Endoscopy;;  ? ?Patient Active Problem List  ? Diagnosis Date Noted  ? Acute kidney injury ([place]) 01/12/2022  ? GI bleed 07/15/2021  ? Pulmonary embolism ([place]) 07/15/2021  ? Acute pulmonary embolism ([place]) 06/26/2021  ?  Diabetic ulcer of lower leg ([place]) 06/26/2021  ? Acute respiratory failure with hypoxia ([place]) 06/26/2021  ? COVID-19 virus infection 06/26/2021  ? Lumbar radiculitis 03/30/2021  ? Morbid obesity ([place]) 01/15/2021  ? Personal history of colonic polyps 01/15/2021  ? Rectal bleeding 01/15/2021  ? Pain due to onychomycosis of toenails of both feet 01/15/2021  ? Epistaxis, recurrent 10/21/2020  ? Laceration of nose 10/21/2020  ?  Syncope 10/15/2020  ? OSA (obstructive sleep apnea)   ? Bilateral carotid artery stenosis 07/28/2020  ? Bilateral lower extremity edema 07/28/2020  ? CHF (congestive heart failure) ([place]) 07/28/2020  ? DDD (degenerative disc disease), lumbar 07/28/2020  ? Diabetic peripheral neuropathy ([place]) 07/28/2020  ? Fatty liver 07/28/2020  ? Irritable bowel syndrome with diarrhea 07/28/2020  ? Osteoarthritis of right hip 07/28/2020  ? Upper GI bleed 04/28/2020  ? Hypertensive urgency 04/27/2020  ? Acute GI bleeding 04/27/2020  ? Mixed diabetic hyperlipidemia associated with type 2 diabetes mellitus ([place]) 04/27/2020  ? Rotator cuff arthropathy 10/30/2019  ? Cervical radiculopathy 09/24/2019  ? Trochanteric bursitis of right hip 09/24/2019  ? Chronic diastolic CHF (congestive heart failure) ([place]) 08/23/2019  ? ARF (acute renal failure) ([place]) 08/01/2019  ? Hyperkalemia 08/01/2019  ? Uncontrolled type 2 diabetes mellitus with hyperglycemia ([place]) 08/01/2019  ? AKI (acute kidney injury) ([place])   ? Hypotension 07/31/2019  ? DKA (diabetic ketoacidosis) ([place]) 05/05/2019  ? Blurred vision 05/05/2019  ? Trochanteric bursitis 02/11/2019  ? Long-term insulin use ([place]) 01/25/2019  ? Alteration consciousness 11/19/2018  ? Neck pain 10/09/2018  ? Left arm weakness 10/09/2018  ? B12 deficiency 08/10/2017  ? Persistent headaches 08/09/2017  ? GERD (gastroesophageal reflux disease) 06/29/2017  ? Left arm numbness   ? Cerebral embolism with cerebral infarction 06/17/2017  ? TIA (transient ischemic attack) 06/17/2017  ? Chronic back pain 12/29/2016  ? Depression 12/15/2016  ? Vitamin D deficiency 12/09/2016  ? Right hip pain 12/07/2016  ? Hypokalemia 12/07/2016  ? Type 2 diabetes mellitus with vascular disease ([place]) 05/31/2016  ? Normocytic normochromic anemia 05/31/2016  ? Chest pain 05/31/2016  ? Coronary artery disease involving native coronary artery of native heart without angina pectoris 01/06/2016  ? Lactic acidosis 05/28/2014  ? Nonspecific  chest pain 01/29/2014  ? Uncontrolled secondary diabetes with peripheral neuropathy ([place]) 01/29/2014  ? Obesity, Class III, BMI 40-49.9 (morbid obesity) ([place]) 01/29/2014  ? Snoring 01/29/2014  ? Dyslipidemia 01/29/2014  ? HTN (hypertension) 01/29/2014  ? Abnormal nuclear stress test 01/29/2014  ? ? ?REFERRING PROVIDER: [name], FNP ?  ?REFERRING DIAG: back pain, wrist pain ? ?THERAPY DIAG:  ?No diagnosis found. ? ?PERTINENT HISTORY:  ?Carpal tunnel surgery on Left wrist ?Heart attack and strokes back in 2018 ? ?PRECAUTIONS: None ? ?SUBJECTIVE:  ?I was having back pain earlier. I took pain medicine and now pain is 5/10. I was in the hospital for a couple of days. My BP dropped after MD changed my meds and I was admitted. I am better now. I am having some bleeding on my left shin. It is not a new condition.  ? ?PAIN:  ?Are you having pain? Yes:  ?NRPS: 7/10 ?At worst: 10/10 at rest and walking ?Pain location: R low side ?Pain description: aching, dull, sometimes sharp ?Aggravating factors: walking and rest ?Relieving factors: pain meds ? ?PATIENT GOALS To alleviate pain. ? ? ?OBJECTIVE:  ?PATIENT SURVEYS:  ?ODI: 24/50 ?  ?POSTURE:  ?Increased lumbar lordotic curvature ?  ?PALPATION: ?TTP right glute med ?  ?  LUMBAR ROM:  ?  ?Active  A/PROM  ?01/10/2022  ?Flexion 40  ?Extension 10  ?Right lateral flexion 20*  ?Left lateral flexion 20*  ?Right rotation 30  ?Left rotation 30  ? (Blank rows = not tested) ?  ?LE ROM: ?  ?Active  Right ?01/10/2022 Left ?01/10/2022  ?Hip flexion [facility] WFL  ?Hip extension [facility] WFL  ?Hip abduction      ?Hip adduction      ?Hip internal rotation      ?Hip external rotation      ?Knee flexion      ?Knee extension      ?Ankle dorsiflexion      ?Ankle plantarflexion      ?Ankle inversion      ?Ankle eversion      ? (Blank rows = not tested) ?  ?LE MMT: ?  ?MMT Right ?01/10/2022 Left ?01/10/2022  ?Hip flexion 4* 4*  ?Hip extension 3+ 3+  ?Hip abduction 4 4-  ?Hip adduction      ?Hip internal  rotation 4 4*  ?Hip external rotation 4 4  ?Knee flexion 5 5  ?Knee extension 5 5  ?Ankle dorsiflexion 5 5  ?Ankle plantarflexion      ?Ankle inversion      ?Ankle eversion      ? (Blank rows = not tested) ?

## 2022-02-08 ENCOUNTER — Ambulatory Visit: Payer: Medicaid Other | Admitting: Physical Therapy

## 2022-02-10 ENCOUNTER — Encounter (HOSPITAL_BASED_OUTPATIENT_CLINIC_OR_DEPARTMENT_OTHER): Payer: Medicaid Other | Admitting: Internal Medicine

## 2022-02-10 ENCOUNTER — Encounter: Payer: Medicaid Other | Admitting: Physical Therapy

## 2022-02-10 ENCOUNTER — Ambulatory Visit: Payer: Medicaid Other | Admitting: Physical Therapy

## 2022-02-10 DIAGNOSIS — I872 Venous insufficiency (chronic) (peripheral): Secondary | ICD-10-CM

## 2022-02-10 DIAGNOSIS — I890 Lymphedema, not elsewhere classified: Secondary | ICD-10-CM

## 2022-02-10 DIAGNOSIS — L97822 Non-pressure chronic ulcer of other part of left lower leg with fat layer exposed: Secondary | ICD-10-CM | POA: Diagnosis not present

## 2022-02-10 DIAGNOSIS — L97812 Non-pressure chronic ulcer of other part of right lower leg with fat layer exposed: Secondary | ICD-10-CM | POA: Diagnosis not present

## 2022-02-10 DIAGNOSIS — Z09 Encounter for follow-up examination after completed treatment for conditions other than malignant neoplasm: Secondary | ICD-10-CM | POA: Diagnosis not present

## 2022-02-10 NOTE — Progress Notes (Signed)
[name], [name] ([id number]) ?Visit Report for 02/10/2022 ?Arrival Information Details ?Patient Name: Date of Service: ?[name], [name]. 02/10/2022 11:15 A M ?Medical Record Number: [id number] ?Patient Account Number: [id number] ?Date of Birth/Sex: Treating RN: ?1961/04/23 (61 y.o. [name] ?Primary Care [name]: [name] Other Clinician: ?Referring [name]: ?Treating [name]/Extender: [name] ?[name] ?[name]: 2 ?Visit Information History Since Last Visit ?Added or deleted any medications: No ?Patient Arrived: Ambulatory ?Any new allergies or adverse reactions: No ?Arrival Time: 11:41 ?Had a fall or experienced change in No ?Transfer Assistance: None ?activities of daily living that may affect ?Patient Identification Verified: Yes ?risk of falls: ?Secondary Verification Process Completed: Yes ?Signs or symptoms of abuse/neglect since last visito No ?Patient Requires Transmission-Based No ?Hospitalized since last visit: No ?Precautions: ?Implantable device outside of the clinic excluding No ?Patient Has Alerts: Yes ?cellular tissue based products placed in the center ?Patient Alerts: Patient on Blood Thinner ?since last visit: ?09/06/21 ABI L1.01 office ?Has Dressing in Place as Prescribed: Yes ?09/06/21 ABI R1.02 office ?Has Compression in Place as Prescribed: Yes ?Pain Present Now: No ?Electronic Signature(s) ?Signed: 02/10/2022 4:55:52 PM By: [name] ?Entered By: [name] on 02/10/2022 11:41:59 ?-------------------------------------------------------------------------------- ?Clinic Level of Care Assessment Details ?Patient Name: Date of Service: ?[name], [name]. 02/10/2022 11:15 A M ?Medical Record Number: [id number] ?Patient Account Number: [id number] ?Date of Birth/Sex: Treating RN: ?02/05/1961 (61 y.o. M) Rolin Barry, Bobbi ?Primary Care Irene Collings: [name] Other Clinician: ?Referring Jarad Barth: ?Treating Tomeka Kantner/Extender: [name] ?[name] ?[name]: 2 ?Clinic Level of Care Assessment Items ?TOOL 4 Quantity Score ?X- 1 0 ?Use when only an EandM is performed on FOLLOW-UP visit ?ASSESSMENTS - Nursing Assessment / Reassessment ?X- 1 10 ?Reassessment of Co-morbidities (includes updates in patient status) ?X- 1 5 ?Reassessment of Adherence to Stein Plan ?ASSESSMENTS - Wound and Skin A ssessment / Reassessment ?'[]'$  - 0 ?Simple Wound Assessment / Reassessment - one wound ?X- 2 5 ?Complex Wound Assessment / Reassessment - multiple wounds ?X- 1 10 ?Dermatologic / Skin Assessment (not related to wound area) ?ASSESSMENTS - Focused Assessment ?X- 2 5 ?Circumferential Edema Measurements - multi extremities ?X- 1 10 ?Nutritional Assessment / Counseling / Intervention ?'[]'$  - 0 ?Lower Extremity Assessment (monofilament, tuning fork, pulses) ?'[]'$  - 0 ?Peripheral Arterial Disease Assessment (using hand held doppler) ?ASSESSMENTS - Ostomy and/or Continence Assessment and Care ?'[]'$  - 0 ?Incontinence Assessment and Management ?'[]'$  - 0 ?Ostomy Care Assessment and Management (repouching, etc.) ?PROCESS - Coordination of Care ?'[]'$  - 0 ?Simple Patient / Family Education for ongoing care ?X- 1 20 ?Complex (extensive) Patient / Family Education for ongoing care ?'[]'$  - 0 ?Staff obtains Consents, Records, T Results / Process Orders ?est ?'[]'$  - 0 ?Staff telephones HHA, Nursing Homes / Clarify orders / etc ?'[]'$  - 0 ?Routine Transfer to another Facility (non-emergent condition) ?'[]'$  - 0 ?Routine Hospital Admission (non-emergent condition) ?'[]'$  - 0 ?New Admissions / Biomedical engineer / Ordering NPWT Apligraf, etc. ?, ?'[]'$  - 0 ?Emergency Hospital Admission (emergent condition) ?'[]'$  - 0 ?Simple Discharge Coordination ?X- 1 15 ?Complex (extensive) Discharge Coordination ?PROCESS - Special Needs ?'[]'$  - 0 ?Pediatric / Minor Patient Management ?'[]'$  - 0 ?Isolation Patient Management ?'[]'$  - 0 ?Hearing / Language / Visual special needs ?'[]'$  - 0 ?Assessment of Community assistance  (transportation, D/C planning, etc.) ?'[]'$  - 0 ?Additional assistance / Altered mentation ?'[]'$  - 0 ?Support Surface(s) Assessment (bed, cushion, seat, etc.) ?INTERVENTIONS - Wound Cleansing / Measurement ?'[]'$  - 0 ?  Simple Wound Cleansing - one wound ?X- 2 5 ?Complex Wound Cleansing - multiple wounds ?X- 1 5 ?Wound Imaging (photographs - any number of wounds) ?'[]'$  - 0 ?Wound Tracing (instead of photographs) ?'[]'$  - 0 ?Simple Wound Measurement - one wound ?X- 1 5 ?Complex Wound Measurement - multiple wounds ?INTERVENTIONS - Wound Dressings ?'[]'$  - 0 ?Small Wound Dressing one or multiple wounds ?'[]'$  - 0 ?Medium Wound Dressing one or multiple wounds ?'[]'$  - 0 ?Large Wound Dressing one or multiple wounds ?'[]'$  - 0 ?Application of Medications - topical ?'[]'$  - 0 ?Application of Medications - injection ?INTERVENTIONS - Miscellaneous ?'[]'$  - 0 ?External ear exam ?'[]'$  - 0 ?Specimen Collection (cultures, biopsies, blood, body fluids, etc.) ?'[]'$  - 0 ?Specimen(s) / Culture(s) sent or taken to Lab for analysis ?'[]'$  - 0 ?Patient Transfer (multiple staff / Civil Service fast streamer / Similar devices) ?'[]'$  - 0 ?Simple Staple / Suture removal (25 or less) ?'[]'$  - 0 ?Complex Staple / Suture removal (26 or more) ?'[]'$  - 0 ?Hypo / Hyperglycemic Management (close monitor of Blood Glucose) ?'[]'$  - 0 ?Ankle / Brachial Index (ABI) - do not check if billed separately ?X- 1 5 ?Vital Signs ?Has the patient been seen at the hospital within the last three years: Yes ?Total Score: 115 ?Level Of Care: New/Established - Level 3 ?Electronic Signature(s) ?Signed: 02/10/2022 5:36:27 PM By: Deon Pilling RN, BSN ?Entered By: Deon Pilling on 02/10/2022 12:05:26 ?-------------------------------------------------------------------------------- ?Encounter Discharge Information Details ?Patient Name: Date of Service: ?[name], [name]. 02/10/2022 11:15 A M ?Medical Record Number: [id number] ?Patient Account Number: [id number] ?Date of Birth/Sex: Treating RN: ?02-10-1961 (61 y.o. M) Rolin Barry,  Bobbi ?Primary Care Lavaun Greenfield: [name] Other Clinician: ?Referring Safiyyah Vasconez: ?Treating Shianne Zeiser/Extender: [name] ?[name] ?[name]: 2 ?Encounter Discharge Information Items ?Discharge Condition: Stable ?Ambulatory Status: Ambulatory ?Discharge Destination: Home ?Transportation: Private Auto ?Accompanied By: self ?Schedule Follow-up Appointment: No ?Clinical Summary of Care: ?Notes ?aided patient with applying compression stockings. ?Electronic Signature(s) ?Signed: 02/10/2022 5:36:27 PM By: Deon Pilling RN, BSN ?Entered By: Deon Pilling on 02/10/2022 12:09:12 ?-------------------------------------------------------------------------------- ?Lower Extremity Assessment Details ?Patient Name: Date of Service: ?[name], [name]. 02/10/2022 11:15 A M ?Medical Record Number: [id number] ?Patient Account Number: [id number] ?Date of Birth/Sex: Treating RN: ?03/14/1961 (61 y.o. [name] ?Primary Care Karissa Meenan: [name] Other Clinician: ?Referring Taym Twist: ?Treating Zniya Cottone/Extender: [name] ?[name] ?[name]: 2 ?Edema Assessment ?Assessed: [Left: Yes] [Right: Yes] ?Edema: [Left: Yes] [Right: Yes] ?Calf ?Left: Right: ?Point of Measurement: 33 cm From Medial Instep 46.5 cm 44.5 cm ?Ankle ?Left: Right: ?Point of Measurement: 11 cm From Medial Instep 26 cm 27 cm ?Vascular Assessment ?Pulses: ?Dorsalis Pedis ?Palpable: [Left:Yes] [Right:Yes] ?Electronic Signature(s) ?Signed: 02/10/2022 4:55:52 PM By: [name] ?Entered By: [name] on 02/10/2022 11:46:40 ?-------------------------------------------------------------------------------- ?Multi Wound Chart Details ?Patient Name: ?Date of Service: ?[name], [name] 02/10/2022 11:15 A M ?Medical Record Number: [id number] ?Patient Account Number: [id number] ?Date of Birth/Sex: ?Treating RN: ?[birth year] (61 y.o. M) Rolin Barry, Bobbi ?Primary Care Shandie Bertz: [name] ?Other Clinician: ?Referring  Sitlali Koerner: ?Treating Chaitanya Amedee/Extender: [name] ?[name] ?[name]: 2 ?Vital Signs ?Height(in): 70 ?Capillary Blood Glucose(mg/dl): 150 ?Weight(lbs): 331 ?Pulse(bpm): 76 ?Body Mass Index(BMI): 47.5 ?Blo

## 2022-02-10 NOTE — Progress Notes (Signed)
[place], Khaleef L. ([id number]) ?Visit Report for 02/10/2022 ?Chief Complaint Document Details ?Patient Name: Date of Service: ?[name], TREYLEN GIBBS. 02/10/2022 11:15 A M ?Medical Record Number: [id number] ?Patient Account Number: [id number] ?Date of Birth/Sex: Treating RN: ?[birth year] (61 y.o. M) Rolin Barry, Bobbi ?Primary Care Provider: [name] Other Clinician: ?Referring Provider: ?Treating Provider/Extender: [name] ?[name] ?Weeks in Treatment: 2 ?Information Obtained from: Patient ?Chief Complaint ?08/16/2021: Bilateral lower extremity scattered blisters. 1 small open wound limited to skin breakdown on the right lower extremity. ?08/23/2021; 1 small open wound limited to skin breakdown on the left lower extremity ?01/27/2022; patient with bilateral lower extremity wounds ?Electronic Signature(s) ?Signed: 02/10/2022 3:46:31 PM By: [name] DO ?Entered By: [name] on 02/10/2022 15:03:27 ?-------------------------------------------------------------------------------- ?HPI Details ?Patient Name: Date of Service: ?[name], TRAFTON ROKER. 02/10/2022 11:15 A M ?Medical Record Number: [id number] ?Patient Account Number: [id number] ?Date of Birth/Sex: Treating RN: ?October 06, 1961 (61 y.o. M) Rolin Barry, Bobbi ?Primary Care Provider: [name] Other Clinician: ?Referring Provider: ?Treating Provider/Extender: [name] ?[name] ?Weeks in Treatment: 2 ?History of Present Illness ?HPI Description: Admission 08/16/2021 ?Mr. Kern Gingras is a 61 year old male with a past medical history of CVA, hypertension, uncontrolled type 2 diabetes on insulin complicated by peripheral ?neuropathy, Chronic venous insufficiency/lymphedema, diastolic CHF, and left lower extremity DVT that presents to clinic today for an open wound to his right ?lower extremity. He reports an ongoing issue with blistering to his legs and weeping that wax and wane over the past 6 months. He was previously seen in our ?clinic in  June 2022 however he had closed wounds at that time. He reported that his diuretic was increased then and helped with symptom control. About 2 ?months ago he developed increased blistering and swelling again. He reports wearing 20/30 compression stockings however it is unclear if he is doing this daily ?since he reports the stockings are very difficult to put on. He currently denies signs of infection. ?10/31; patient presents for follow-up. He has tolerated the compression wraps well. The right leg wound has healed. He does have a small open wound now ?present on the left anterior lower extremity limited to skin breakdown. He would like to continue wrapping both legs. He states he cannot afford juxta lite ?compression and would like to use his compression stockings he currently owns. He denies signs of infection. ?11/7; patient presents for follow-up. He has no issues or complaints today. While in office he scratched With his fingers at his right leg and now has a small ?open wound. He continues to have a left lower extremity wound. He tolerated the wraps well. He has his compression stockings today. He denies signs of ?infection. ?11/14; patient presents for follow-up. He has his compression stockings today. He has no issues or complaints. ?Readmission 01/27/2022 ?Mr. Abrahan Fulmore is a 62 year old male with a past medical history of CVA, hypertension, uncontrolled type 2 diabetes on insulin complicated by peripheral ?neuropathy, chronic venous insufficiency/lymphedema, diastolic CHF and left lower extremity DVT that presents to the clinic for bilateral lower extremity ?wounds. He states this started less than a week ago after scrubbing his legs in the shower. He has compression stockings that he reports using daily. He has ?been keeping the wounds covered. He denies signs of infection. ?4/13; patient returns to clinic today for treatment of his chronic venous insufficiency and lymphedema wounds on the left anterior  mid tibia and the right ?posterior calf. We have been using silver alginate ?4/20; patient presents for follow-up. He  has his compression stockings today. He has no issues or complaints. ?Electronic Signature(s) ?Signed: 02/10/2022 3:46:31 PM By: [name] DO ?Entered By: [name] on 02/10/2022 15:04:32 ?-------------------------------------------------------------------------------- ?Physical Exam Details ?Patient Name: Date of Service: ?[name], JAMARIOUS FEBO. 02/10/2022 11:15 A M ?Medical Record Number: [id number] ?Patient Account Number: [id number] ?Date of Birth/Sex: Treating RN: ?October 10, 1961 (61 y.o. M) Rolin Barry, Bobbi ?Primary Care Provider: [name] Other Clinician: ?Referring Provider: ?Treating Provider/Extender: [name] ?[name] ?Weeks in Treatment: 2 ?Constitutional ?respirations regular, non-labored and within target range for patient.Marland Kitchen ?Cardiovascular ?2+ dorsalis pedis/posterior tibialis pulses. ?Psychiatric ?pleasant and cooperative. ?Notes ?Epithelization to the previous wound sites to the legs bilaterally ?Electronic Signature(s) ?Signed: 02/10/2022 3:46:31 PM By: [name] DO ?Entered By: [name] on 02/10/2022 15:05:14 ?-------------------------------------------------------------------------------- ?Physician Orders Details ?Patient Name: Date of Service: ?[name], TORENCE PALMERI. 02/10/2022 11:15 A M ?Medical Record Number: [id number] ?Patient Account Number: [id number] ?Date of Birth/Sex: Treating RN: ?November 22, 1960 (61 y.o. M) Rolin Barry, Bobbi ?Primary Care Provider: [name] Other Clinician: ?Referring Provider: ?Treating Provider/Extender: [name] ?[name] ?Weeks in Treatment: 2 ?Verbal / Phone Orders: No ?Diagnosis Coding ?ICD-10 Coding ?Code Description ?S34.196 Non-pressure chronic ulcer of other part of right lower leg with fat layer exposed ?Q22.979 Non-pressure chronic ulcer of other part of left lower leg with fat layer exposed ?I89.0  Lymphedema, not elsewhere classified ?I87.2 Venous insufficiency (chronic) (peripheral) ?I87.313 Chronic venous hypertension (idiopathic) with ulcer of bilateral lower extremity ?E11.622 Type 2 diabetes mellitus with other skin ulcer ?Discharge From [facility] ?Discharge from [place] - Call if any future wound care needs. ?Licensed conveyancer ?May shower with protection but do not get wound dressing(s) wet. ?Edema Control - Lymphedema / SCD / Other ?Elevate legs to the level of the heart or above for 30 minutes daily and/or when sitting, a frequency of: - throughout the day 3-4 times a day. ?Avoid standing for long periods of time. ?Patient to wear own compression stockings every day. ?Exercise regularly ?Moisturize legs daily. - every night before bed. ?Compression stocking or Garment 20-30 mm/Hg pressure to: - apply to both legs every morning and remove at night. ?Ensure to apply the 8-20mHg compression stockings over the 20-334mg. ?Electronic Signature(s) ?Signed: 02/10/2022 3:46:31 PM By: HoKalman ShanO ?Entered By: HoKalman Shann 02/10/2022 15:08:22 ?-------------------------------------------------------------------------------- ?Problem List Details ?Patient Name: ?Date of Service: ?WAMarquis LunchJEMississippi. 02/10/2022 11:15 A M ?Medical Record Number: [id number] Account Number: [id number] of Birth/Sex: ?Treating RN: ?12/08/1960-08-155(65.o. M) DeRolin BarryBobbi ?Primary Care Provider: HaWindy CarinaOther Clinician: ?Referring Provider: ?Treating Provider/Extender: HoKalman ShanHaWindy CarinaWeeks in Treatment: 2 ?Active Problems ?ICD-10 ?Encounter ?Code Description Active Date MDM ?Diagnosis ?L9E08.144on-pressure chronic ulcer of other part of right lower leg with fat layer 01/27/2022 No Yes ?exposed ?L9Y18.563on-pressure chronic ulcer of other part of left lower leg with fat layer exposed4/03/2022 No Yes ?I89.0 Lymphedema, not elsewhere classified 01/27/2022 No Yes ?I87.2 Venous  insufficiency (chronic) (peripheral) 01/27/2022 No Yes ?I87.313 Chronic venous hypertension (idiopathic) with ulcer of bilateral lower extremity 01/27/2022 No Yes ?E11.622 Type 2 diabetes mellitus with other s

## 2022-02-13 NOTE — Progress Notes (Signed)
? ?Cardiology Clinic Note  ? ?Patient Name: [name] ?Date of Encounter: 02/15/2022 ? ?Primary Care Provider:  [name], FNP ?Primary Cardiologist:  Juan Ruths, MD ? ?Patient Profile  ?  ?[name] 61 year old male presents the clinic today for follow-up of his hypertension and coronary artery disease. ? ?Past Medical History  ?  ?Past Medical History:  ?Diagnosis Date  ? Anemia   ? Arthritis   ? Back pain   ? CAD (coronary artery disease)   ? a. s/p DES to LAD in 05/2016  ? Cervical radiculopathy   ? Chronic diastolic CHF (congestive heart failure) ([place])   ? Chronic pain   ? Depression   ? DVT (deep venous thrombosis) ([place])   ? Hematemesis   ? Hepatic steatosis   ? Hyperlipidemia   ? Hypertension   ? IBS (irritable bowel syndrome)   ? Morbid obesity ([place])   ? OSA (obstructive sleep apnea)   ? Pancreatitis   ? PE (pulmonary thromboembolism) ([place])   ? Peripheral neuropathy   ? PUD (peptic ulcer disease)   ? Renal disorder   ? Stroke [facility])   ? a. ?details unclear - not seen on imaging when he was admitted in 05/2017 for TIA symptoms which were felt due to cervical radiculopathy.  ? Thoracic aortic ectasia (HCC)   ? a. 4.3cm ectatic ascending thoracic aorta by CT 06/2017.   ? Type 2 diabetes mellitus ([place])   ? ?Past Surgical History:  ?Procedure Laterality Date  ? CARDIAC CATHETERIZATION N/A 05/31/2016  ? Procedure: Left Heart Cath and Coronary Angiography;  Surgeon: Juan M Martinique, MD;  Location: [place] CV LAB;  Service: Cardiovascular;  Laterality: N/A;  ? CARDIAC CATHETERIZATION N/A 05/31/2016  ? Procedure: Intravascular Pressure Wire/FFR Study;  Surgeon: Juan M Martinique, MD;  Location: [place] CV LAB;  Service: Cardiovascular;  Laterality: N/A;  ? CARDIAC CATHETERIZATION N/A 05/31/2016  ? Procedure: Coronary Stent Intervention;  Surgeon: Juan M Martinique, MD;  Location: [place] CV LAB;  Service: Cardiovascular;  Laterality: N/A;  ? COLONOSCOPY N/A 07/17/2021  ? Procedure: COLONOSCOPY;   Surgeon: Juan Corner, MD;  Location: [facility];  Service: Endoscopy;  Laterality: N/A;  ? COLONOSCOPY WITH PROPOFOL N/A 04/29/2020  ? Procedure: COLONOSCOPY WITH PROPOFOL;  Surgeon: Juan Corner, MD;  Location: [place];  Service: Endoscopy;  Laterality: N/A;  ? ESOPHAGOGASTRODUODENOSCOPY N/A 04/29/2020  ? Procedure: ESOPHAGOGASTRODUODENOSCOPY (EGD);  Surgeon: Juan Corner, MD;  Location: [place];  Service: Endoscopy;  Laterality: N/A;  ? ESOPHAGOGASTRODUODENOSCOPY (EGD) WITH PROPOFOL N/A 07/17/2021  ? Procedure: ESOPHAGOGASTRODUODENOSCOPY (EGD) WITH PROPOFOL;  Surgeon: Juan Corner, MD;  Location: [facility];  Service: Endoscopy;  Laterality: N/A;  ? LEFT HEART CATH AND CORONARY ANGIOGRAPHY N/A 12/08/2017  ? Procedure: LEFT HEART CATH AND CORONARY ANGIOGRAPHY;  Surgeon: Juan Man, MD;  Location: [place] CV LAB;  Service: Cardiovascular;  Laterality: N/A;  ? LEFT HEART CATHETERIZATION WITH CORONARY ANGIOGRAM N/A 02/03/2014  ? Procedure: LEFT HEART CATHETERIZATION WITH CORONARY ANGIOGRAM;  Surgeon: Juan Casino, MD;  Location: [facility] CATH LAB;  Service: Cardiovascular;  Laterality: N/A;  ? left leg stent     ? POLYPECTOMY  04/29/2020  ? Procedure: POLYPECTOMY;  Surgeon: Juan Corner, MD;  Location: [facility] ENDOSCOPY;  Service: Endoscopy;;  ? POLYPECTOMY  07/17/2021  ? Procedure: POLYPECTOMY;  Surgeon: Juan Corner, MD;  Location: [facility];  Service: Endoscopy;;  ? ? ?Allergies ? ?Allergies  ?Allergen Reactions  ? Coconut Flavor [Flavoring Agent]  Hives  ? Coconut Oil Hives  ? Ibuprofen Other (See Comments)  ?  Made gastric ulcers worse  ? Aleve [Naproxen] Other (See Comments)  ?  DUE TO KIDNEYS  ? Nsaids Other (See Comments)  ?  Stomach ulcers ?  ? Other Hives  ?  Nut Allergy  ? Vascepa [Icosapent Ethyl] Other (See Comments)  ?  headaches, chest pain, similar to sx of a stroke, hypotension   ? ? ?History of Present Illness  ?  ?[name] has a PMH of hypertension, type 2  diabetes, coronary artery disease, DVT, cerebral embolism with cerebral infarct, TIA, chronic diastolic CHF, and hypertensive urgency.  He received PCI and DES to his LAD 8/17.  CTA 9/17 showed no pulmonary embolus.  Cardiac catheterization 2/19 showed patent stents and normal LV function.  His LVEDP was 27 mmHg.  Carotid Dopplers 4/19 showed 1-39% right and nearly normal left carotid.  CTA August 2019 showed no thoracic aortic aneurysm.  Echocardiogram 6/20 showed normal LV function, mild LVH, G1 DD and mildly dilated aortic root.  He was last seen by Dr. Stanford Stein on 01/14/2020.  During that time he described occasional dyspnea and chest pain which was chronic.  This typically occurred with activities.  He was exercising on the elliptical, exercise bike and treadmill and stated that he had no chest pain with these activities.  He had no recurrent syncopal events. ?  ?He follows with Dr. Debara Stein for management of his lipids ?  ?He contacted nurse triage line on 07/15/2020 and indicated that he was having chest discomfort and increased shortness of breath.  He was instructed to take his furosemide 80 mg 3 times daily x2 days. ?  ?He presented to the clinic 07/17/20 for follow-up evaluation stated he was worried about becoming dehydrated.  He increased his fluid consumption and only took extra Lasix in the morning and afternoon for the last 3 days.  He stated that he had not been able to be as physically active and was only using his elliptical and stationary bike 1 times per week.  I encouraged him to start chewing sugar-free gum and using sugar-free candy to help with his thirst.  I  started him on a 64 ounce fluid restriction, gave him the salty 6 diet sheet with 1800 mg sodium recommendation, continued  Daily weights and planned follow-up in 3 months. ?  ?He was seen by Dr. Stanford Stein on 10/27/2020.  His echocardiogram 12/21 showed LV function was normal, mild left ventricular enlargement.  He was felt to be having  orthostatic episodes.  Due to his syncopal episodes his furosemide was decreased from 40 mg 3 times daily to 40 mg daily.  He denied dyspnea, chest pain, palpitations, and had no lower extremity edema.  His potassium at that time was 4.2 with a creatinine of 0.89. ? ?He presented to the clinic 01/26/21 for follow-up evaluation stated he felt well.  He reported that he was exercising every other day doing elliptical exercise bike and lifting weights.  He did notice some increased shortness of breath with increased physical activity but no increased shortness of breath with normal activities.  We reviewed his weight he had lost almost 15 pounds.  He had been following a low-sodium heart healthy diet.  His blood pressure was slightly elevated initially  at 145/77 but on recheck it was 134/68.  He reported that he had been compliant with his medications and had not had any side effects.  I asked him  to continue his heart healthy low-sodium diet, maintain his physical activity, and planned follow-up in 6 months. ? ?He developed a pulmonary embolus 9/22.  This was felt to be secondary to COVID-19 infection, mobility, and obesity.  Venous Dopplers 9/22 showed no DVT.  His echocardiogram 9/22 showed normal LVEF, moderate LVH.  Follow-up CTA 12/22 showed resolution of pulmonary embolus.  Nuclear study 1/23 showed ejection fraction of 51% and no ischemia or infarct.  Pulmonary embolus was followed by hematology. ? ?He was seen in follow-up by Dr. Stanford Stein on 12/21/2021.  During that time he noted dyspnea on exertion.  He was noted to have lower extremity edema.  He continued to have intermittent chest discomfort.  It was noted that his pain was in various locations on his chest. ? ?He presents to the clinic today for follow-up evaluation and states he feels like he is ready to get back to more regular exercise.  He reports that before he had his PE he was going to the gym regularly.  We reviewed increasing his physical activity  slowly and being able to carry on a conversation while doing his exercise.  He and his wife expressed understanding.  He continues to use a heart healthy low-sodium diet.  He has been wearing lower extremity

## 2022-02-15 ENCOUNTER — Encounter: Payer: Self-pay | Admitting: General Practice

## 2022-02-15 ENCOUNTER — Ambulatory Visit: Payer: Medicaid Other | Admitting: General Practice

## 2022-02-15 VITALS — BP 130/64 | HR 77 | Ht 70.0 in | Wt 338.8 lb

## 2022-02-15 DIAGNOSIS — I25119 Atherosclerotic heart disease of native coronary artery with unspecified angina pectoris: Secondary | ICD-10-CM

## 2022-02-15 DIAGNOSIS — I10 Essential (primary) hypertension: Secondary | ICD-10-CM

## 2022-02-15 DIAGNOSIS — I5033 Acute on chronic diastolic (congestive) heart failure: Secondary | ICD-10-CM | POA: Diagnosis not present

## 2022-02-15 DIAGNOSIS — I2699 Other pulmonary embolism without acute cor pulmonale: Secondary | ICD-10-CM

## 2022-02-15 NOTE — Patient Instructions (Signed)
Medication Instructions:  ?The current medical regimen is effective;  continue present plan and medications as directed. Please refer to the Current Medication list given to you today.  ? ?*If you need a refill on your cardiac medications before your next appointment, please call your pharmacy* ? ?Lab Work:   Testing/Procedures:  ?NONE    NONE ? ?If you have labs (blood work) drawn today and your tests are completely normal, you will receive your results only by: ?MyChart Message (if you have MyChart) OR  A paper copy in the mail ?If you have any lab test that is abnormal or we need to change your treatment, we will call you to review the results. ? ?Special Instructions ?PLEASE READ AND FOLLOW SALTY 6-ATTACHED-1,'800mg'$  daily ? ?PLEASE INCREASE PHYSICAL ACTIVITY SLOWLY-IF UNABLE TO HAVE A CONVERSATION WHILE EXERCISING YOU ARE EXERCISING TOO MUCH/HARD. ? ?DAILY WEIGHTS ? ?Follow-Up: ?Your next appointment:  6-9 month(s) In Person with Kirk Ruths, MD  :1 ? ?At [facility], you and your health needs are our priority.  As part of our continuing mission to provide you with exceptional heart care, we have created designated Provider Care Teams.  These Care Teams include your primary Cardiologist (physician) and Advanced Practice Providers (APPs -  Physician Assistants and Nurse Practitioners) who all work together to provide you with the care you need, when you need it. ? ?We recommend signing up for the patient portal called "MyChart".  Sign up information is provided on this After Visit Summary.  MyChart is used to connect with patients for Virtual Visits (Telemedicine).  Patients are able to view lab/test results, encounter notes, upcoming appointments, etc.  Non-urgent messages can be sent to your provider as well.   ?To learn more about what you can do with MyChart, go to [email or web address].   ? ? ?Important Information About Sugar ? ? ? ? ? ? ?        6 SALTY THINGS TO AVOID     1,'800MG'$   DAILY ? ? ? ? ? ? ?

## 2022-03-04 ENCOUNTER — Other Ambulatory Visit: Payer: Self-pay

## 2022-03-04 ENCOUNTER — Emergency Department (HOSPITAL_COMMUNITY): Payer: Medicaid Other

## 2022-03-04 ENCOUNTER — Emergency Department (HOSPITAL_COMMUNITY)
Admission: EM | Admit: 2022-03-04 | Discharge: 2022-03-05 | Discharge status: Against Medical Advice | Payer: Medicaid Other | Attending: Emergency Medicine | Admitting: Emergency Medicine

## 2022-03-04 ENCOUNTER — Encounter (HOSPITAL_COMMUNITY): Payer: Self-pay

## 2022-03-04 DIAGNOSIS — E119 Type 2 diabetes mellitus without complications: Secondary | ICD-10-CM | POA: Insufficient documentation

## 2022-03-04 DIAGNOSIS — Z5321 Procedure and treatment not carried out due to patient leaving prior to being seen by health care provider: Secondary | ICD-10-CM | POA: Insufficient documentation

## 2022-03-04 DIAGNOSIS — R600 Localized edema: Secondary | ICD-10-CM | POA: Insufficient documentation

## 2022-03-04 DIAGNOSIS — R42 Dizziness and giddiness: Secondary | ICD-10-CM | POA: Diagnosis not present

## 2022-03-04 DIAGNOSIS — R197 Diarrhea, unspecified: Secondary | ICD-10-CM | POA: Insufficient documentation

## 2022-03-04 DIAGNOSIS — I2510 Atherosclerotic heart disease of native coronary artery without angina pectoris: Secondary | ICD-10-CM | POA: Diagnosis not present

## 2022-03-04 DIAGNOSIS — R0789 Other chest pain: Secondary | ICD-10-CM | POA: Diagnosis not present

## 2022-03-04 DIAGNOSIS — R531 Weakness: Secondary | ICD-10-CM | POA: Diagnosis present

## 2022-03-04 DIAGNOSIS — I509 Heart failure, unspecified: Secondary | ICD-10-CM | POA: Insufficient documentation

## 2022-03-04 LAB — CBC WITH DIFFERENTIAL/PLATELET
Abs Immature Granulocytes: 0.03 10*3/uL (ref 0.00–0.07)
Basophils Absolute: 0 10*3/uL (ref 0.0–0.1)
Basophils Relative: 0 %
Eosinophils Absolute: 0.2 10*3/uL (ref 0.0–0.5)
Eosinophils Relative: 2 %
HCT: 34.6 % — ABNORMAL LOW (ref 39.0–52.0)
Hemoglobin: 11.5 g/dL — ABNORMAL LOW (ref 13.0–17.0)
Immature Granulocytes: 0 %
Lymphocytes Relative: 25 %
Lymphs Abs: 2.4 10*3/uL (ref 0.7–4.0)
MCH: 31.5 pg (ref 26.0–34.0)
MCHC: 33.2 g/dL (ref 30.0–36.0)
MCV: 94.8 fL (ref 80.0–100.0)
Monocytes Absolute: 0.8 10*3/uL (ref 0.1–1.0)
Monocytes Relative: 9 %
Neutro Abs: 6.1 10*3/uL (ref 1.7–7.7)
Neutrophils Relative %: 64 %
Platelets: 198 10*3/uL (ref 150–400)
RBC: 3.65 MIL/uL — ABNORMAL LOW (ref 4.22–5.81)
RDW: 13.1 % (ref 11.5–15.5)
WBC: 9.6 10*3/uL (ref 4.0–10.5)
nRBC: 0 % (ref 0.0–0.2)

## 2022-03-04 LAB — BASIC METABOLIC PANEL
Anion gap: 12 (ref 5–15)
BUN: 34 mg/dL — ABNORMAL HIGH (ref 6–20)
CO2: 20 mmol/L — ABNORMAL LOW (ref 22–32)
Calcium: 9.6 mg/dL (ref 8.9–10.3)
Chloride: 104 mmol/L (ref 98–111)
Creatinine, Ser: 2.05 mg/dL — ABNORMAL HIGH (ref 0.61–1.24)
GFR, Estimated: 37 mL/min — ABNORMAL LOW (ref >60)
Glucose, Bld: 168 mg/dL — ABNORMAL HIGH (ref 70–99)
Potassium: 4.1 mmol/L (ref 3.5–5.1)
Sodium: 136 mmol/L (ref 135–145)

## 2022-03-04 LAB — TROPONIN I (HIGH SENSITIVITY): Troponin I (High Sensitivity): 6 ng/L (ref <18)

## 2022-03-04 LAB — CBC

## 2022-03-04 NOTE — ED Notes (Signed)
Lab called [facility] coltted they were unable to  dismiss that order and had difficulty placing another order  order placed  ed will recollect ?

## 2022-03-04 NOTE — ED Triage Notes (Addendum)
Pt arrived via [facility] from home for weakness and diarrheax3 days. Pt c/o left sided chest painx3 days. Dizziness upon standing. ASA '324mg'$ .  ?

## 2022-03-04 NOTE — ED Provider Triage Note (Signed)
Emergency Medicine Provider Triage Evaluation Note ? ?[name] , a 61 y.o. male  was evaluated in triage.  Pt complains of weakness and diarrhea over the last couple days.  Also increasing left-sided chest pain.  Described as dull and non--reproducible.  No radiation.  Denies shortness of breath, fever, headache, vision changes, leg weakness.  Also endorses some increased edema of his lower extremities.  Hx of DM type II, CHF, CAD, dyslipidemia, anemia. ? ?Review of Systems  ?Positive: As above ?Negative: As above ? ?Physical Exam  ?BP (!) 100/51   Pulse 61   Temp 97.9 ?F (36.6 ?C) (Oral)   Resp 18   Ht '5\' 10"'$  (1.778 m)   Wt (!) 153.7 kg   SpO2 96%   BMI 48.62 kg/m?  ?Gen:   Awake, no distress   ?Resp:  Normal effort, CTAB ?MSK:   Moves extremities without difficulty  ?Other:  Significant swelling/edema of the lower extremities.  2+ DP pulses identified with Doppler.  Abdomen distended and nontender.  RRR without M/R/G. ? ?Medical Decision Making  ?Medically screening exam initiated at 6:18 PM.  Appropriate orders placed.  ALASTAIR HENNES was informed that the remainder of the evaluation will be completed by another provider, this initial triage assessment does not replace that evaluation, and the importance of remaining in the ED until their evaluation is complete. ? ?Labs, imaging, EKG ordered. ?  ?Prince Rome, PA-C ?81/27/51 1822 ? ?

## 2022-03-10 ENCOUNTER — Other Ambulatory Visit: Payer: Self-pay

## 2022-03-10 ENCOUNTER — Encounter: Payer: Medicaid Other | Admitting: *Deleted

## 2022-03-10 VITALS — BP 123/50 | HR 66 | Temp 98.5°F | Resp 18 | Ht 70.0 in | Wt 330.8 lb

## 2022-03-10 DIAGNOSIS — Z006 Encounter for examination for normal comparison and control in clinical research program: Secondary | ICD-10-CM

## 2022-03-10 NOTE — Research (Addendum)
[name] Screening Run in  10-Mar-2022   Apolipoprotein B48  3.97 mg/dL      [] Clinically Significant  [] Not Clinically Significant      [facility] Consent    Subject Name: [name]   Subject met inclusion and exclusion criteria.  The informed consent form, study requirements and expectations were reviewed with the subject and questions and concerns were addressed prior to the signing of the consent form.  The subject verbalized understanding of the trial requirements.  The subject agreed to participate in the Core  trial and signed the informed consent at 0846 on 10-Mar-2022.  The informed consent was obtained prior to performance of any protocol-specific procedures for the subject.  A copy of the signed informed consent was given to the subject and a copy was placed in the subject's medical record.   [name]  Protocol number 1 Consent version  2  [facility]  [phone]    [id number]  SUBJECT ID: [id number]                    DATE: 10-Mar-2022          [x]  MALE                            []  MALE AGE:61 ETHINICITY:   []  HISPANIC/LATINO      [x]  NON- HISPANIC/LATINO RACE:           []   WHITE             [x]  BLACK/AFRICAN AMERICAN                         []   ASIAN              []  AMERICAN INDIAN/[place] NATIVE                         []   NATIVE [place]/OTHER PACIFIC ISLANDER                         []   OTHER  FUTURE RESEARCH [x]  USE OF SAMPLES FOR FUTURE RESEARCH [x]  Consented for Sub study CTA  INCLUSION CRITERIA Consent      [x]    Pregnancy authorization []   AGE 23 or greater [x]   Triglycerides fasting 150 or greater with either: [x]   Dx of ASCVD (CAD, CVA, PAD) OR [x]   Increased risk for ASCVD as below []   Type 2 DM OR 2 or more below [x]   Men 55 or greater Woman 65 or greater [x]  []    Woman with Hx of preeclampsia or premature menopause (before 46) []    Family Hx of premature ASCDD (Before 57 for males, or before 45 for females  []    Current  Tobacco use  []    Metabolic syndrome []    Hypertension with Treatment  [x]    CKD stage 3 Or (GFR 30-59)  []    LDL-C 160 or greater LDL-C 100 or greater on therapy to lower  []    Elevated high-sensitivity C Reactive protein (>2.0)  []   Elevated lipoprotein (a) (>50mg /dL or [id number]) OR  []   Triglycerides fasting 500 or greater  []   Lipid-lowering med (for at least 4 weeks) [place] to comply with diet and lifestyle recommendations  [x]   Females must be non-pregnant and non-lactating and EITHER  []   Surgically sterile, post-menopausal, abstinent OR  Use highly effective contraceptive at time of consent until at least 30 weeks after last dose of study drug  []   Males must be surgical sterile, abstinent or using a highly effective contraceptive at time of consent until at least 30 weeks after the last dose of study drug   []     EXCLUSION CRITERIA           N/A                                            [x]  Major surgery, peripheral revascularization, or non-urgent PCI within 3 months prior to screening, or planned major surgery or major procedure during the study []   Active pancreatitis within 4 weeks prior to screening []   Acute coronary syndrome or CVA/TIA within 3 months of screening []   Screening labs: ALT or AST >3.0 x ULN Total bilirubin >1.5 ULN unless due to Gilbert's syndrome GFR >30 Urine Protein/creatine ratio >500 Uncontrolled HTN (BP>180/100 despite Treatment Uncontrolled hypothyroidism TSH>1.5 and T4 < LLN, or Hormone therapy not stable for 4 weeks or greater  []  []  []  []  []  []   DM newly dx within 12 weeks of screening A1c > 9.5 at screening  []  []   Change in basal insulin >20% within 3 months prior to screening  []   Type 1 Dm: episode of DKA or > 3 episodes of severe hypo glycerides with on 6 months prior to screening   Active infections, HIV, Hep C, Hep B []   Active infection requiring systemic antiviral or antimicrobial tx that will not be complete  prior to study day 1 or active Covid 19 infection not resolved by study day 1 []   Malignancy within 5 years (except for non-melanoma skin ca, cervical in situ ca, breast ductal ca in situ or stage 1 prostate Ca that has been tx []   Hypersensitivity to the active substance (olezarsen or placebo) []   Tx with another investigational drug or devise within 1 month or screening  []   Previous tx with an oligonucleotide within 4 months of screening []   Con meds/ procedure restrictions:   Systemic corticosteroids of anabolic steroids within 6 weeks prior to screening and during the study unless approved   []   Use of bile acids resins (colestipol or Colesevelam) within 4 weeks prior to screening or planned during the study  []   Plasma apheresis within 4 weeks prior to screening or planned during the study  []   Change in meds known to exacerbate hypertriglyceridemia (beta blockers, thiazides, isotretinoin, oral antidiabetic meds, tamoxifen, estrogens or progestins within 4 weeks prior to screening  []    Change or expected need for significant change in titration of therapies known to significantly reduce TG (GLP-1 agonists, other incretin mimetics, Phentermine/topiramate, naltrexone/bupropion, Xenical, or bariatric surgery within 3 months prior to screening  []    Change in antipsychotic meds within 30 days of screening or >495ml within 60 days of Screening  []    Blood or plasma donation of 50-481ml within 30 days of screening or>499 within 60 days of screening []   Unwilling to comply with procedures, following up, or unwillingness to cooperate fully with the investigator []   ETOH abuse or recent (<1 year) or other substance abuse []      [name] here for run in visit for [facility]. He states he has  had no abd pain, or other pain. He states  he went to the ED 03-04-22, but left before he was seen. He went because he was feeling weak and left sided chest pain. No urgent care visits or PCP  visits this month. Medications reviewed states he no longer takes Eliquis. No other changes noted. Consent signed at [phone]. Dr Riley Kill in to do exam at 0845. EKG complete 0911, VS taken at 0846, Blood work at 0900. Urine obtained at 0915. Last GFR from 03-04-22 was 37 so that is an exclusion for the CTa.  Mr Drudge states his CPAP was recalled and he has not had it replaced.message sent to Dr Isaiah Serge.    Current Outpatient Medications:    Accu-Chek FastClix Lancets MISC, 1 Device by Other route 2 (two) times daily. , Disp: , Rfl:    ACCU-CHEK GUIDE test strip, 1 each by Other route 2 (two) times daily., Disp: , Rfl:    albuterol (VENTOLIN HFA) 108 (90 Base) MCG/ACT inhaler, Inhale 2 puffs into the lungs every 6 (six) hours as needed for wheezing or shortness of breath., Disp: , Rfl:    atorvastatin (LIPITOR) 40 MG tablet, Take 40 mg by mouth at bedtime., Disp: , Rfl:    carvedilol (COREG) 12.5 MG tablet, Take 12.5 mg by mouth 2 (two) times daily., Disp: , Rfl:    cyanocobalamin 1000 MCG tablet, Take by mouth., Disp: , Rfl:    diclofenac (FLECTOR) 1.3 % PTCH, Place 1 patch onto the skin 2 (two) times daily as needed (back and knee pain)., Disp: , Rfl:    fenofibrate (TRICOR) 145 MG tablet, Take 1 tablet (145 mg total) by mouth daily. (Patient taking differently: Take 145 mg by mouth every morning.), Disp: 90 tablet, Rfl: 2   furosemide (LASIX) 40 MG tablet, Take 1 tablet (40 mg total) by mouth 3 (three) times daily., Disp: , Rfl:    gabapentin (NEURONTIN) 800 MG tablet, Take 800 mg by mouth 3 (three) times daily., Disp: , Rfl:    glipiZIDE (GLUCOTROL) 10 MG tablet, Take 10 mg by mouth 2 (two) times daily., Disp: , Rfl: 4   HUMULIN R U-500 KWIKPEN 500 UNIT/ML kwikpen, Inject 0-110 Units into the skin See admin instructions. Per sliding scale 3 times daily with meals, Disp: , Rfl:    Insulin Pen Needle 31G X 5 MM MISC, Use 1 needle daily to inject insulin as prescribed, Disp: 100 each, Rfl: 2    isosorbide mononitrate (IMDUR) 30 MG 24 hr tablet, TAKE 1 TABLET BY MOUTH DAILY (Patient taking differently: Take 30 mg by mouth daily.), Disp: 28 tablet, Rfl: 12   lisinopril (ZESTRIL) 20 MG tablet, Take 20 mg by mouth daily., Disp: , Rfl:    Menthol, Topical Analgesic, (BIOFREEZE EX), Apply 1 application topically 2 (two) times daily as needed (knee pain)., Disp: , Rfl:    nitroGLYCERIN (NITROSTAT) 0.4 MG SL tablet, Place 1 tablet (0.4 mg total) under the tongue every 5 (five) minutes as needed for chest pain., Disp: 100 tablet, Rfl: 1   ondansetron (ZOFRAN) 4 MG tablet, Take 4 mg by mouth 2 (two) times daily as needed for nausea/vomiting., Disp: , Rfl:    Oxycodone HCl 20 MG TABS, Take 20 mg by mouth 4 (four) times daily as needed (pain)., Disp: , Rfl:    pantoprazole (PROTONIX) 40 MG tablet, Take 40 mg by mouth every morning., Disp: , Rfl:    potassium chloride (MICRO-K) 10 MEQ CR capsule, Take 20 mEq by mouth 2 (two) times daily., Disp: , Rfl:  sertraline (ZOLOFT) 50 MG tablet, Take 50 mg by mouth every morning., Disp: , Rfl:    tiZANidine (ZANAFLEX) 4 MG tablet, Take 4 mg by mouth at bedtime as needed for muscle spasms., Disp: , Rfl:    traZODone (DESYREL) 50 MG tablet, Take 1 tablet (50 mg total) by mouth at bedtime., Disp: 30 tablet, Rfl: 11   Vitamin D, Ergocalciferol, (DRISDOL) 1.25 MG (50000 UT) CAPS capsule, Take 50,000 Units by mouth every Monday., Disp: , Rfl:    apixaban (ELIQUIS) 5 MG TABS tablet, Take 2 tablets (10mg ) twice daily for 7 days, then 1 tablet (5mg ) twice daily (Patient not taking: Reported on 02/15/2022), Disp: 60 tablet, Rfl: 0

## 2022-03-10 NOTE — Progress Notes (Signed)
Patient seen for possible enrollment in the [facility] study.  Patient did not qualify for Core, although has history of elevated triglycerides, sees Dr. Debara Pickett, and has a history of pancreatitis.  He has had prior PCI of the LAD with follow up cath demonstrating a widely patent Synergy stent.  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  Has DM on insulin, and prior HgA1c are elevated.  He has some atypical chest pain.  He was in the ER for fatigue a couple of days ago, but waited an extended period and elected not to stay.  He left without being seen except by Triage.  He was on CPAP for OSA but stopped about 3 years ago and he says they have not called him back.    Alert, oriented. No JVD.  Lungs clear. Cor regular without murmur Abd soft with tenderness Ext no edema  Protocol reviewed in detail.  He is stable for screening for the trial.  I discussed what is known about the agent, what to expect, potential risks, and reviewed in detail research trials.  Screening labs will be obtained.   Loretha Brasil. Lia Foyer, MD, [place] Director, [facility]

## 2022-03-14 DIAGNOSIS — G5603 Carpal tunnel syndrome, bilateral upper limbs: Secondary | ICD-10-CM | POA: Insufficient documentation

## 2022-03-14 NOTE — Research (Cosign Needed Addendum)
[facility] Screening Run-in   [id number]          ABNORMAL LABS: TEST RESULT  BUN 61  CREATININE 2.75  GFR-CKD-EPI EQUATION 26  GLUCOSE 26   TEST RESULT  URIC ACID 8.8  AST 11  ALKALINE PHOSPHATE 36  GGT 99   TEST RESULT  HS-C-REACTIVE PROTEIN 8.4  INSULIN 300  A1c 9.4  TRIGLYCERIDES 502              TEST RESULT  HDL 28  APOLIPOPROTEIN CIII 31.83  RED BLOOD CELLS 3.79  HEMOGLOBIN 11.6   TEST RESULT  HEMATOCRIT 35   URINE RESULTS TEST RESULT  ALBUMIN CREATININE RATIO 85  GLUCOSE 500  PROTEIN 20  URINE PROTEIN 32   TEST RESULT  URINE ALBUMIN 5.92  PROTEIN CREATININE RATIO 461  BACTERIA 1+  SQUAMOUS EPITHELIAL CELLS 1-2   TEST RESULT  MUCUS 1+   Are these clinically significant? '[]'$  YES              '[]'$  NO

## 2022-03-15 NOTE — Research (Cosign Needed Addendum)
[name] Screening run in [employer] 10-Mar-2022

## 2022-03-16 ENCOUNTER — Encounter: Payer: Self-pay | Admitting: *Deleted

## 2022-03-16 DIAGNOSIS — Z006 Encounter for examination for normal comparison and control in clinical research program: Secondary | ICD-10-CM

## 2022-03-16 NOTE — Research (Signed)
Message left for [name] GFR was low and he is a screen fail for Essence research study.

## 2022-03-17 ENCOUNTER — Encounter: Payer: Self-pay | Admitting: *Deleted

## 2022-03-17 DIAGNOSIS — Z006 Encounter for examination for normal comparison and control in clinical research program: Secondary | ICD-10-CM

## 2022-03-17 NOTE — Research (Signed)
[name] has no PCP listed. GFR results sent to Dr Stanford Breed.

## 2022-03-18 ENCOUNTER — Other Ambulatory Visit: Payer: Self-pay | Admitting: General Practice

## 2022-03-22 ENCOUNTER — Emergency Department (HOSPITAL_COMMUNITY): Payer: Medicaid Other

## 2022-03-22 ENCOUNTER — Other Ambulatory Visit: Payer: Self-pay

## 2022-03-22 ENCOUNTER — Other Ambulatory Visit: Payer: Medicaid Other | Admitting: General Practice

## 2022-03-22 ENCOUNTER — Inpatient Hospital Stay (HOSPITAL_COMMUNITY)
Admission: EM | Admit: 2022-03-22 | Discharge: 2022-03-26 | DRG: 175 | Disposition: A | Discharge status: Home / Self Care | Payer: Medicaid Other | Attending: Internal Medicine | Admitting: Internal Medicine

## 2022-03-22 DIAGNOSIS — N1832 Chronic kidney disease, stage 3b: Secondary | ICD-10-CM | POA: Diagnosis present

## 2022-03-22 DIAGNOSIS — I2699 Other pulmonary embolism without acute cor pulmonale: Secondary | ICD-10-CM | POA: Diagnosis present

## 2022-03-22 DIAGNOSIS — E1122 Type 2 diabetes mellitus with diabetic chronic kidney disease: Secondary | ICD-10-CM | POA: Diagnosis present

## 2022-03-22 DIAGNOSIS — I2510 Atherosclerotic heart disease of native coronary artery without angina pectoris: Secondary | ICD-10-CM | POA: Diagnosis present

## 2022-03-22 DIAGNOSIS — N179 Acute kidney failure, unspecified: Secondary | ICD-10-CM | POA: Diagnosis present

## 2022-03-22 DIAGNOSIS — Z7984 Long term (current) use of oral hypoglycemic drugs: Secondary | ICD-10-CM

## 2022-03-22 DIAGNOSIS — I5032 Chronic diastolic (congestive) heart failure: Secondary | ICD-10-CM | POA: Diagnosis present

## 2022-03-22 DIAGNOSIS — Z8673 Personal history of transient ischemic attack (TIA), and cerebral infarction without residual deficits: Secondary | ICD-10-CM | POA: Diagnosis not present

## 2022-03-22 DIAGNOSIS — K648 Other hemorrhoids: Secondary | ICD-10-CM | POA: Diagnosis present

## 2022-03-22 DIAGNOSIS — I9589 Other hypotension: Secondary | ICD-10-CM | POA: Diagnosis present

## 2022-03-22 DIAGNOSIS — Z86718 Personal history of other venous thrombosis and embolism: Secondary | ICD-10-CM | POA: Diagnosis not present

## 2022-03-22 DIAGNOSIS — Z86711 Personal history of pulmonary embolism: Secondary | ICD-10-CM

## 2022-03-22 DIAGNOSIS — I2694 Multiple subsegmental pulmonary emboli without acute cor pulmonale: Secondary | ICD-10-CM | POA: Diagnosis not present

## 2022-03-22 DIAGNOSIS — K625 Hemorrhage of anus and rectum: Secondary | ICD-10-CM | POA: Diagnosis present

## 2022-03-22 DIAGNOSIS — E861 Hypovolemia: Secondary | ICD-10-CM | POA: Diagnosis not present

## 2022-03-22 DIAGNOSIS — E785 Hyperlipidemia, unspecified: Secondary | ICD-10-CM | POA: Diagnosis present

## 2022-03-22 DIAGNOSIS — Z91199 Patient's noncompliance with other medical treatment and regimen due to unspecified reason: Secondary | ICD-10-CM

## 2022-03-22 DIAGNOSIS — G4733 Obstructive sleep apnea (adult) (pediatric): Secondary | ICD-10-CM | POA: Diagnosis present

## 2022-03-22 DIAGNOSIS — Z6841 Body Mass Index (BMI) 40.0 and over, adult: Secondary | ICD-10-CM | POA: Diagnosis not present

## 2022-03-22 DIAGNOSIS — Z794 Long term (current) use of insulin: Secondary | ICD-10-CM

## 2022-03-22 DIAGNOSIS — R609 Edema, unspecified: Secondary | ICD-10-CM | POA: Diagnosis not present

## 2022-03-22 DIAGNOSIS — K219 Gastro-esophageal reflux disease without esophagitis: Secondary | ICD-10-CM | POA: Diagnosis present

## 2022-03-22 DIAGNOSIS — G9341 Metabolic encephalopathy: Secondary | ICD-10-CM | POA: Diagnosis present

## 2022-03-22 DIAGNOSIS — D500 Iron deficiency anemia secondary to blood loss (chronic): Secondary | ICD-10-CM | POA: Diagnosis present

## 2022-03-22 DIAGNOSIS — D649 Anemia, unspecified: Secondary | ICD-10-CM | POA: Diagnosis present

## 2022-03-22 DIAGNOSIS — E119 Type 2 diabetes mellitus without complications: Secondary | ICD-10-CM

## 2022-03-22 DIAGNOSIS — T501X5A Adverse effect of loop [high-ceiling] diuretics, initial encounter: Secondary | ICD-10-CM | POA: Diagnosis present

## 2022-03-22 DIAGNOSIS — K922 Gastrointestinal hemorrhage, unspecified: Secondary | ICD-10-CM | POA: Insufficient documentation

## 2022-03-22 DIAGNOSIS — I130 Hypertensive heart and chronic kidney disease with heart failure and stage 1 through stage 4 chronic kidney disease, or unspecified chronic kidney disease: Secondary | ICD-10-CM | POA: Diagnosis present

## 2022-03-22 DIAGNOSIS — E1142 Type 2 diabetes mellitus with diabetic polyneuropathy: Secondary | ICD-10-CM | POA: Diagnosis present

## 2022-03-22 DIAGNOSIS — Z955 Presence of coronary angioplasty implant and graft: Secondary | ICD-10-CM | POA: Diagnosis not present

## 2022-03-22 DIAGNOSIS — E66813 Obesity, class 3: Secondary | ICD-10-CM | POA: Diagnosis present

## 2022-03-22 DIAGNOSIS — I10 Essential (primary) hypertension: Secondary | ICD-10-CM | POA: Diagnosis present

## 2022-03-22 DIAGNOSIS — E1165 Type 2 diabetes mellitus with hyperglycemia: Secondary | ICD-10-CM | POA: Diagnosis present

## 2022-03-22 DIAGNOSIS — Z79899 Other long term (current) drug therapy: Secondary | ICD-10-CM

## 2022-03-22 DIAGNOSIS — R0602 Shortness of breath: Secondary | ICD-10-CM | POA: Diagnosis present

## 2022-03-22 LAB — COMPREHENSIVE METABOLIC PANEL
ALT: 17 U/L (ref 0–44)
AST: 21 U/L (ref 15–41)
Albumin: 3.8 g/dL (ref 3.5–5.0)
Alkaline Phosphatase: 29 U/L — ABNORMAL LOW (ref 38–126)
Anion gap: 13 (ref 5–15)
BUN: 51 mg/dL — ABNORMAL HIGH (ref 6–20)
CO2: 23 mmol/L (ref 22–32)
Calcium: 9.2 mg/dL (ref 8.9–10.3)
Chloride: 102 mmol/L (ref 98–111)
Creatinine, Ser: 3.16 mg/dL — ABNORMAL HIGH (ref 0.61–1.24)
GFR, Estimated: 22 mL/min — ABNORMAL LOW (ref >60)
Glucose, Bld: 200 mg/dL — ABNORMAL HIGH (ref 70–99)
Potassium: 4.2 mmol/L (ref 3.5–5.1)
Sodium: 138 mmol/L (ref 135–145)
Total Bilirubin: 0.3 mg/dL (ref 0.3–1.2)
Total Protein: 7.1 g/dL (ref 6.5–8.1)

## 2022-03-22 LAB — IRON AND TIBC
Iron: 69 ug/dL (ref 45–182)
Saturation Ratios: 17 % — ABNORMAL LOW (ref 17.9–39.5)
TIBC: 413 ug/dL (ref 250–450)
UIBC: 344 ug/dL

## 2022-03-22 LAB — URINALYSIS, ROUTINE W REFLEX MICROSCOPIC
Bilirubin Urine: NEGATIVE
Glucose, UA: >=500 mg/dL — AB
Hgb urine dipstick: NEGATIVE
Ketones, ur: NEGATIVE mg/dL
Leukocytes,Ua: NEGATIVE
Nitrite: NEGATIVE
Protein, ur: 30 mg/dL — AB
Specific Gravity, Urine: 1.013 (ref 1.005–1.030)
pH: 5 (ref 5.0–8.0)

## 2022-03-22 LAB — DIFFERENTIAL
Abs Immature Granulocytes: 0.04 10*3/uL (ref 0.00–0.07)
Basophils Absolute: 0 10*3/uL (ref 0.0–0.1)
Basophils Relative: 0 %
Eosinophils Absolute: 0.2 10*3/uL (ref 0.0–0.5)
Eosinophils Relative: 2 %
Immature Granulocytes: 0 %
Lymphocytes Relative: 21 %
Lymphs Abs: 2 10*3/uL (ref 0.7–4.0)
Monocytes Absolute: 0.8 10*3/uL (ref 0.1–1.0)
Monocytes Relative: 8 %
Neutro Abs: 6.4 10*3/uL (ref 1.7–7.7)
Neutrophils Relative %: 69 %

## 2022-03-22 LAB — TROPONIN I (HIGH SENSITIVITY): Troponin I (High Sensitivity): 8 ng/L (ref <18)

## 2022-03-22 LAB — I-STAT CHEM 8, ED
BUN: 50 mg/dL — ABNORMAL HIGH (ref 6–20)
Calcium, Ion: 1.05 mmol/L — ABNORMAL LOW (ref 1.15–1.40)
Chloride: 104 mmol/L (ref 98–111)
Creatinine, Ser: 3.3 mg/dL — ABNORMAL HIGH (ref 0.61–1.24)
Glucose, Bld: 198 mg/dL — ABNORMAL HIGH (ref 70–99)
HCT: 32 % — ABNORMAL LOW (ref 39.0–52.0)
Hemoglobin: 10.9 g/dL — ABNORMAL LOW (ref 13.0–17.0)
Potassium: 4.4 mmol/L (ref 3.5–5.1)
Sodium: 139 mmol/L (ref 135–145)
TCO2: 24 mmol/L (ref 22–32)

## 2022-03-22 LAB — TYPE AND SCREEN
ABO/RH(D): O POS
Antibody Screen: NEGATIVE

## 2022-03-22 LAB — HEPATIC FUNCTION PANEL
ALT: 17 U/L (ref 0–44)
AST: 18 U/L (ref 15–41)
Albumin: 3.7 g/dL (ref 3.5–5.0)
Alkaline Phosphatase: 25 U/L — ABNORMAL LOW (ref 38–126)
Bilirubin, Direct: 0.1 mg/dL (ref 0.0–0.2)
Indirect Bilirubin: 0.4 mg/dL (ref 0.3–0.9)
Total Bilirubin: 0.5 mg/dL (ref 0.3–1.2)
Total Protein: 7.2 g/dL (ref 6.5–8.1)

## 2022-03-22 LAB — MAGNESIUM: Magnesium: 1.7 mg/dL (ref 1.7–2.4)

## 2022-03-22 LAB — CBC
HCT: 32.3 % — ABNORMAL LOW (ref 39.0–52.0)
Hemoglobin: 10.4 g/dL — ABNORMAL LOW (ref 13.0–17.0)
MCH: 31 pg (ref 26.0–34.0)
MCHC: 32.2 g/dL (ref 30.0–36.0)
MCV: 96.4 fL (ref 80.0–100.0)
Platelets: 193 10*3/uL (ref 150–400)
RBC: 3.35 MIL/uL — ABNORMAL LOW (ref 4.22–5.81)
RDW: 13.6 % (ref 11.5–15.5)
WBC: 9.4 10*3/uL (ref 4.0–10.5)
nRBC: 0 % (ref 0.0–0.2)

## 2022-03-22 LAB — FERRITIN: Ferritin: 132 ng/mL (ref 24–336)

## 2022-03-22 LAB — I-STAT VENOUS BLOOD GAS, ED
Acid-base deficit: 1 mmol/L (ref 0.0–2.0)
Bicarbonate: 24.7 mmol/L (ref 20.0–28.0)
Calcium, Ion: 1.13 mmol/L — ABNORMAL LOW (ref 1.15–1.40)
HCT: 33 % — ABNORMAL LOW (ref 39.0–52.0)
Hemoglobin: 11.2 g/dL — ABNORMAL LOW (ref 13.0–17.0)
O2 Saturation: 81 %
Potassium: 4.1 mmol/L (ref 3.5–5.1)
Sodium: 142 mmol/L (ref 135–145)
TCO2: 26 mmol/L (ref 22–32)
pCO2, Ven: 46.8 mmHg (ref 44–60)
pH, Ven: 7.331 (ref 7.25–7.43)
pO2, Ven: 49 mmHg — ABNORMAL HIGH (ref 32–45)

## 2022-03-22 LAB — TSH: TSH: 1.283 u[IU]/mL (ref 0.350–4.500)

## 2022-03-22 LAB — RETICULOCYTES
Immature Retic Fract: 14 % (ref 2.3–15.9)
RBC.: 3.48 MIL/uL — ABNORMAL LOW (ref 4.22–5.81)
Retic Count, Absolute: 43.5 10*3/uL (ref 19.0–186.0)
Retic Ct Pct: 1.3 % (ref 0.4–3.1)

## 2022-03-22 LAB — OSMOLALITY, URINE: Osmolality, Ur: 438 mOsm/kg (ref 300–900)

## 2022-03-22 LAB — PROTIME-INR
INR: 1.1 (ref 0.8–1.2)
Prothrombin Time: 14.3 seconds (ref 11.4–15.2)

## 2022-03-22 LAB — GLUCOSE, CAPILLARY: Glucose-Capillary: 164 mg/dL — ABNORMAL HIGH (ref 70–99)

## 2022-03-22 LAB — CK: Total CK: 124 U/L (ref 49–397)

## 2022-03-22 LAB — PHOSPHORUS: Phosphorus: 3.4 mg/dL (ref 2.5–4.6)

## 2022-03-22 LAB — NA AND K (SODIUM & POTASSIUM), RAND UR
Potassium Urine: 39 mmol/L
Sodium, Ur: 27 mmol/L

## 2022-03-22 LAB — POC OCCULT BLOOD, ED: Fecal Occult Bld: POSITIVE — AB

## 2022-03-22 LAB — CBG MONITORING, ED
Glucose-Capillary: 164 mg/dL — ABNORMAL HIGH (ref 70–99)
Glucose-Capillary: 176 mg/dL — ABNORMAL HIGH (ref 70–99)

## 2022-03-22 LAB — VITAMIN B12: Vitamin B-12: 389 pg/mL (ref 180–914)

## 2022-03-22 LAB — APTT: aPTT: 26 seconds (ref 24–36)

## 2022-03-22 LAB — FOLATE: Folate: 8.3 ng/mL (ref >5.9)

## 2022-03-22 LAB — SODIUM, URINE, RANDOM: Sodium, Ur: 27 mmol/L

## 2022-03-22 LAB — CREATININE, URINE, RANDOM: Creatinine, Urine: 102.08 mg/dL

## 2022-03-22 LAB — LACTIC ACID, PLASMA: Lactic Acid, Venous: 3.6 mmol/L (ref 0.5–1.9)

## 2022-03-22 LAB — OSMOLALITY: Osmolality: 323 mOsm/kg (ref 275–295)

## 2022-03-22 MED ORDER — OXYCODONE HCL 5 MG PO TABS
10.0000 mg | ORAL_TABLET | Freq: Four times a day (QID) | ORAL | Status: DC | PRN
  Administered 2022-03-22 – 2022-03-25 (×5): 10 mg via ORAL
  Filled 2022-03-22 (×5): qty 2

## 2022-03-22 MED ORDER — PANTOPRAZOLE SODIUM 40 MG IV SOLR: 40.0000 mg | Freq: Two times a day (BID) | INTRAVENOUS | Status: DC

## 2022-03-22 MED ORDER — SERTRALINE HCL 50 MG PO TABS
50.0000 mg | ORAL_TABLET | Freq: Every morning | ORAL | Status: DC
  Administered 2022-03-23 – 2022-03-26 (×4): 50 mg via ORAL
  Filled 2022-03-22 (×4): qty 1

## 2022-03-22 MED ORDER — GABAPENTIN 400 MG PO CAPS
800.0000 mg | ORAL_CAPSULE | Freq: Three times a day (TID) | ORAL | Status: DC
  Administered 2022-03-22 – 2022-03-26 (×11): 800 mg via ORAL
  Filled 2022-03-22 (×12): qty 2

## 2022-03-22 MED ORDER — SODIUM CHLORIDE 0.9% FLUSH
3.0000 mL | Freq: Once | INTRAVENOUS | Status: AC
  Administered 2022-03-22: 3 mL via INTRAVENOUS

## 2022-03-22 MED ORDER — HYDROCODONE-ACETAMINOPHEN 5-325 MG PO TABS
1.0000 | ORAL_TABLET | ORAL | Status: DC | PRN
  Administered 2022-03-26: 2 via ORAL
  Filled 2022-03-22 (×2): qty 2

## 2022-03-22 MED ORDER — SODIUM CHLORIDE 0.9 % IV BOLUS
1000.0000 mL | Freq: Once | INTRAVENOUS | Status: AC
  Administered 2022-03-22: 1000 mL via INTRAVENOUS

## 2022-03-22 MED ORDER — INSULIN NPH (HUMAN) (ISOPHANE) 100 UNIT/ML ~~LOC~~ SUSP
40.0000 [IU] | Freq: Every day | SUBCUTANEOUS | Status: DC
Start: 2022-03-22 — End: 2022-03-22
  Filled 2022-03-22: qty 10

## 2022-03-22 MED ORDER — ACETAMINOPHEN 325 MG PO TABS
650.0000 mg | ORAL_TABLET | Freq: Four times a day (QID) | ORAL | Status: DC | PRN
  Administered 2022-03-22: 650 mg via ORAL
  Filled 2022-03-22: qty 2

## 2022-03-22 MED ORDER — SODIUM CHLORIDE 0.9 % IV SOLN
75.0000 mL/h | INTRAVENOUS | Status: AC
  Administered 2022-03-22 (×2): 75 mL/h via INTRAVENOUS

## 2022-03-22 MED ORDER — NOREPINEPHRINE 4 MG/250ML-% IV SOLN
0.0000 ug/min | INTRAVENOUS | Status: DC
  Filled 2022-03-22: qty 250

## 2022-03-22 MED ORDER — ALBUTEROL SULFATE HFA 108 (90 BASE) MCG/ACT IN AERS: 2.0000 | INHALATION_SPRAY | Freq: Four times a day (QID) | RESPIRATORY_TRACT | Status: DC | PRN

## 2022-03-22 MED ORDER — INSULIN ASPART 100 UNIT/ML IJ SOLN
0.0000 [IU] | INTRAMUSCULAR | Status: DC
  Administered 2022-03-22 – 2022-03-23 (×2): 2 [IU] via SUBCUTANEOUS
  Administered 2022-03-23 (×4): 3 [IU] via SUBCUTANEOUS
  Administered 2022-03-23: 2 [IU] via SUBCUTANEOUS
  Administered 2022-03-24: 5 [IU] via SUBCUTANEOUS
  Administered 2022-03-24: 7 [IU] via SUBCUTANEOUS
  Administered 2022-03-24: 5 [IU] via SUBCUTANEOUS
  Administered 2022-03-24: 3 [IU] via SUBCUTANEOUS
  Administered 2022-03-24: 5 [IU] via SUBCUTANEOUS
  Administered 2022-03-24 – 2022-03-25 (×2): 3 [IU] via SUBCUTANEOUS
  Administered 2022-03-25 (×2): 7 [IU] via SUBCUTANEOUS
  Administered 2022-03-25: 3 [IU] via SUBCUTANEOUS
  Administered 2022-03-25: 5 [IU] via SUBCUTANEOUS
  Administered 2022-03-26: 3 [IU] via SUBCUTANEOUS
  Administered 2022-03-26: 7 [IU] via SUBCUTANEOUS
  Administered 2022-03-26: 3 [IU] via SUBCUTANEOUS

## 2022-03-22 MED ORDER — FENOFIBRATE 54 MG PO TABS
54.0000 mg | ORAL_TABLET | Freq: Every day | ORAL | Status: DC
  Administered 2022-03-22 – 2022-03-26 (×5): 54 mg via ORAL
  Filled 2022-03-22 (×5): qty 1

## 2022-03-22 MED ORDER — ATORVASTATIN CALCIUM 40 MG PO TABS
40.0000 mg | ORAL_TABLET | Freq: Every day | ORAL | Status: DC
  Administered 2022-03-22 – 2022-03-25 (×4): 40 mg via ORAL
  Filled 2022-03-22 (×4): qty 1

## 2022-03-22 MED ORDER — INSULIN GLARGINE-YFGN 100 UNIT/ML ~~LOC~~ SOLN
10.0000 [IU] | Freq: Every day | SUBCUTANEOUS | Status: DC
  Administered 2022-03-22 – 2022-03-23 (×2): 10 [IU] via SUBCUTANEOUS
  Filled 2022-03-22 (×3): qty 0.1

## 2022-03-22 MED ORDER — ACETAMINOPHEN 650 MG RE SUPP: 650.0000 mg | Freq: Four times a day (QID) | RECTAL | Status: DC | PRN

## 2022-03-22 MED ORDER — LACTATED RINGERS IV BOLUS
500.0000 mL | Freq: Once | INTRAVENOUS | Status: AC
  Administered 2022-03-22: 500 mL via INTRAVENOUS

## 2022-03-22 MED ORDER — SODIUM CHLORIDE 0.9 % IV BOLUS: 500.0000 mL | Freq: Once | INTRAVENOUS | Status: DC

## 2022-03-22 MED ORDER — PANTOPRAZOLE SODIUM 40 MG IV SOLR
40.0000 mg | Freq: Once | INTRAVENOUS | Status: AC
  Administered 2022-03-22: 40 mg via INTRAVENOUS
  Filled 2022-03-22: qty 10

## 2022-03-22 NOTE — ED Provider Notes (Addendum)
  Physical Exam  BP 104/65   Pulse (!) 56   Temp 97.7 F (36.5 C) (Oral)   Resp 14   Ht '5\' 10"'$  (1.778 m)   SpO2 98%   BMI 47.46 kg/m     Procedures  .Critical Care Performed by: Regan Lemming, MD Authorized by: Regan Lemming, MD   Critical care provider statement:    Critical care time (minutes):  30   Critical care was time spent personally by me on the following activities:  Development of treatment plan with patient or surrogate, discussions with consultants, evaluation of patient's response to treatment, examination of patient, ordering and review of laboratory studies, ordering and review of radiographic studies, ordering and performing treatments and interventions, pulse oximetry, re-evaluation of patient's condition and review of old charts   Care discussed with: admitting provider    ED Course / MDM   Clinical Course as of 03/22/22 1956  Tue Mar 22, 2022  1951 Fecal Occult Blood, POC(!): POSITIVE [JL]  1955 Creatinine(!): 3.16 [JL]  1955 BUN(!): 51 [JL]  1956 Glucose(!): 200 [JL]  1956 Hemoglobin(!): 10.4 [JL]    Clinical Course User Index [JL] Regan Lemming, MD   Medical Decision Making Amount and/or Complexity of Data Reviewed Labs: ordered. Decision-making details documented in ED Course. Radiology: ordered.  Risk Prescription drug management. Decision regarding hospitalization.    52M presenting with hypotension and AKI, slurred speech in setting of same. Thinking may be due to polypharmacy, dehydration and diuresis. Getting IVFs. Hx of a provoked PE in setting of COVID. Off AC now. Consider VQ scan. Needs admission, pending continued improved BPs, likely floor.  On my evaluation, the patient was hypotensive with a blood pressure cuff that was on his forearm.  After repositioning of the blood pressure cuff, the patient notably had a number of blood pressures that remained soft but were normotensive with maps greater than 65.  The patient is not  tachycardic.    On further questioning, he admits to rectal bleeding for the last 12 days which she states has slowly stopped.  He is not currently on anticoagulation.  He is currently asymptomatic with the exception of mild lightheadedness.  On chart review, the patient's hemoglobin is anemic to 10.4.  On previous measurements it has ranged from 10-12.  Given the concern for dehydration in the setting of overdiuresis, hypovolemia, suspect that the patient is actually hemoconcentrated and that his hemoglobin may be in fact lower.  This is supported by his complaint of GI bleeding/melena for the last 10 to 12 days.  A fecal occult card was collected and sent to lab and resulted positive.  The patient was started on IV Protonix.  Gastroenterology was consulted.  Hospitalist medicine was consulted for admission and Dr. Roel Cluck accepted the patient in admission for further evaluation of anemia, lightheadedness, hypotension in the setting of a suspected GI bleed.            Regan Lemming, MD 03/22/22 2023

## 2022-03-22 NOTE — Assessment & Plan Note (Signed)
Oxygen as needed, pt does not use CPAP

## 2022-03-22 NOTE — ED Notes (Signed)
Nuc med called and stated will perform study on pt in morning.

## 2022-03-22 NOTE — Assessment & Plan Note (Signed)
Chronic continue PPI

## 2022-03-22 NOTE — Assessment & Plan Note (Addendum)
continue Lipitor for now, no aspirin

## 2022-03-22 NOTE — Assessment & Plan Note (Signed)
Chronic will need nutritional consult as an outpatient

## 2022-03-22 NOTE — Research (Signed)
[name] Screening run in [employer]

## 2022-03-22 NOTE — ED Provider Notes (Signed)
[place] EMERGENCY DEPARTMENT Provider Note   CSN: [id number] Arrival date & time: 03/22/22  1017     History  Chief Complaint  Patient presents with   Stroke Symptoms    [name] is a 61 y.o. male presenting to ED with hypotension, dizziness, lightheadedness, headache, vertigo, feeling unwell and sluggish since yesterday.  Patient denies any chest pain or pressure.  He is a has had strokes in the past but says this feels different to him.  He also ports a history of pulmonary embolism, which per my review of the medical records, was diagnosed Sept 2022 (moderate clot burden and RH strain noted), states that he was on anticoagulation for several months but came off of it, although he cannot recall exactly when.  +For Covid in Aug 2022  Last echo Sept 2022 with EF 60-65%, moderate left ventricular hypertrophy, normal right ventricular function, no significant valvular disease.  He had a stress test performed in Jan 2023 reporting no evidence of ischemia at that time.  He does have a history of a previously paced proximal LAD to mid LAD stent noted to be patent in Feb 2019 LHC, and 20% prox RCA stenosis at the time.  He reports he does take Lasix, 40 mg 3 times a day as prescribed.  He was hospitalized per medical record review in March of 2023 for encephalopathy which resolved, and he noted an AKI, and felt that he perhaps had been over diuresed, with difficulties managing both blood pressure and fluid, has been hypotensive on admission requiring 2.5 L IVF  HPI     Home Medications Prior to Admission medications   Medication Sig Start Date End Date Taking? Authorizing Provider  albuterol (VENTOLIN HFA) 108 (90 Base) MCG/ACT inhaler Inhale 2 puffs into the lungs every 6 (six) hours as needed for wheezing or shortness of breath.   Yes [provider]  atorvastatin (LIPITOR) 40 MG tablet Take 40 mg by mouth at bedtime.   Yes [provider]  carvedilol (COREG) 12.5 MG tablet Take 12.5 mg by mouth 2 (two) times daily. 10/22/20  Yes [provider]  cyanocobalamin 1000 MCG tablet Take by mouth. 03/06/20  Yes [provider]  diclofenac (FLECTOR) 1.3 % PTCH Place 1 patch onto the skin 2 (two) times daily as needed (back and knee pain).   Yes [provider]  fenofibrate (TRICOR) 145 MG tablet Take 1 tablet (145 mg total) by mouth daily. Patient taking differently: Take 145 mg by mouth every morning. 06/25/21  Yes Cleaver, Jossie Ng, NP  Fluticasone Propionate (FLONASE ALLERGY RELIEF NA) Place 1 spray into the nose daily as needed (congestion).   Yes [provider]  furosemide (LASIX) 40 MG tablet Take 1 tablet (40 mg total) by mouth 3 (three) times daily. 10/27/21 10/27/22 Yes Goodrich, Callie E, PA-C  gabapentin (NEURONTIN) 800 MG tablet Take 800 mg by mouth 3 (three) times daily. 09/29/21  Yes [provider]  glipiZIDE (GLUCOTROL) 10 MG tablet Take 10 mg by mouth 2 (two) times daily. 05/30/17  Yes [provider]  HUMULIN R U-500 KWIKPEN 500 UNIT/ML kwikpen Inject 85-110 Units into the skin See admin instructions. Per sliding scale 3 times daily with meals 06/04/20  Yes [provider]  isosorbide mononitrate (IMDUR) 30 MG 24 hr tablet TAKE 1 TABLET BY MOUTH DAILY Patient taking differently: Take 30 mg by mouth daily. 01/03/22  Yes Hilty, Nadean Corwin, MD  lisinopril (ZESTRIL) 20 MG  tablet Take 20 mg by mouth daily.   Yes [provider]  Menthol, Topical Analgesic, (BIOFREEZE EX) Apply 1 application topically 2 (two) times daily as needed (knee pain).   Yes [provider]  nitroGLYCERIN (NITROSTAT) 0.4 MG SL tablet Place 1 tablet (0.4 mg total) under the tongue every 5 (five) minutes as needed for chest pain. 02/06/19  Yes Skeet Latch, MD  ondansetron (ZOFRAN) 4 MG tablet Take 4 mg by mouth 2 (two) times daily as needed for nausea/vomiting. 04/21/20  Yes [provider]  Oxycodone HCl 20 MG TABS Take 20 mg by mouth 4 (four) times daily as needed (pain).   Yes [provider]  pantoprazole (PROTONIX) 40 MG tablet Take 40 mg by mouth every morning. 04/13/21  Yes [provider]  potassium chloride (MICRO-K) 10 MEQ CR capsule Take 20 mEq by mouth 2 (two) times daily. 04/03/20  Yes [provider]  sertraline (ZOLOFT) 50 MG tablet Take 50 mg by mouth every morning. 12/17/20  Yes [provider]  tiZANidine (ZANAFLEX) 4 MG tablet Take 4 mg by mouth at bedtime as needed for muscle spasms. 03/31/20  Yes [provider]  traZODone (DESYREL) 50 MG tablet Take 1 tablet (50 mg total) by mouth at bedtime. 12/15/16  Yes Burns, Arloa Koh, MD  Vitamin D, Ergocalciferol, (DRISDOL) 1.25 MG (50000 UT) CAPS capsule Take 50,000 Units by mouth every Monday. 08/15/19  Yes [provider]  apixaban (ELIQUIS) 5 MG TABS tablet Take 2 tablets ('10mg'$ ) twice daily for 7 days, then 1 tablet ('5mg'$ ) twice daily Patient not taking: Reported on 02/15/2022 07/04/21   Little Ishikawa, MD  Insulin Pen Needle 31G X 5 MM MISC Use 1 needle daily to inject insulin as prescribed 06/18/17   Barton Dubois, MD      Allergies    Coconut flavor [flavoring agent], Coconut (cocos nucifera), Ibuprofen, Aleve [naproxen], Nsaids, Other, and Vascepa [icosapent ethyl]    Review of Systems   Review of Systems  Physical Exam Updated Vital Signs BP (!) 96/53   Pulse 62   Temp 97.7 F (36.5 C) (Oral)   Resp (!) 22   Ht '5\' 10"'$  (1.778 m)   SpO2 97%   BMI 47.46 kg/m  Physical Exam  ED Results / Procedures / Treatments   Labs (all labs ordered are listed, but only abnormal results are displayed) Labs Reviewed  CBC - Abnormal; Notable for the following components:      Result Value   RBC 3.35 (*)    Hemoglobin 10.4 (*)    HCT 32.3 (*)    All other components within normal limits  COMPREHENSIVE METABOLIC PANEL - Abnormal; Notable for the  following components:   Glucose, Bld 200 (*)    BUN 51 (*)    Creatinine, Ser 3.16 (*)    Alkaline Phosphatase 29 (*)    GFR, Estimated 22 (*)    All other components within normal limits  URINALYSIS, ROUTINE W REFLEX MICROSCOPIC - Abnormal; Notable for the following components:   APPearance HAZY (*)    Glucose, UA >=500 (*)    Protein, ur 30 (*)    Bacteria, UA RARE (*)    All other components within normal limits  I-STAT CHEM 8, ED - Abnormal; Notable for the following components:   BUN 50 (*)    Creatinine, Ser 3.30 (*)    Glucose, Bld 198 (*)    Calcium, Ion 1.05 (*)    Hemoglobin 10.9 (*)  HCT 32.0 (*)    All other components within normal limits  CBG MONITORING, ED - Abnormal; Notable for the following components:   Glucose-Capillary 164 (*)    All other components within normal limits  PROTIME-INR  APTT  DIFFERENTIAL  NA AND K (SODIUM & POTASSIUM), RAND UR  CREATININE, URINE, RANDOM    EKG EKG Interpretation  Date/Time:  Tuesday Mar 22 2022 10:21:28 EDT Ventricular Rate:  56 PR Interval:  174 QRS Duration: 90 QT Interval:  454 QTC Calculation: 438 R Axis:   25 Text Interpretation: Sinus bradycardia Nonspecific T wave abnormality Abnormal ECG When compared with ECG of 04-Mar-2022 17:32, PREVIOUS ECG IS PRESENT Confirmed by Octaviano Glow [phone]) on 03/22/2022 3:30:21 PM  Radiology CT HEAD WO CONTRAST  Result Date: 03/22/2022 CLINICAL DATA:  Vision loss EXAM: CT HEAD WITHOUT CONTRAST TECHNIQUE: Contiguous axial images were obtained from the base of the skull through the vertex without intravenous contrast. RADIATION DOSE REDUCTION: This exam was performed according to the departmental dose-optimization program which includes automated exposure control, adjustment of the mA and/or kV according to patient size and/or use of iterative reconstruction technique. COMPARISON:  Head CT dated January 12, 2022 FINDINGS: Brain: No evidence of acute infarction, hemorrhage,  hydrocephalus, extra-axial collection or mass lesion/mass effect. Vascular: No hyperdense vessel or unexpected calcification. Skull: Normal. Negative for fracture or focal lesion. Sinuses/Orbits: No acute finding. Other: None. IMPRESSION: No acute intracranial abnormality. Electronically Signed   By: Yetta Glassman M.D.   On: 03/22/2022 11:07    Procedures Procedures    Medications Ordered in ED Medications  sodium chloride flush (NS) 0.9 % injection 3 mL (3 mLs Intravenous Given 03/22/22 1125)  sodium chloride 0.9 % bolus 1,000 mL (0 mLs Intravenous Stopped 03/22/22 1455)  sodium chloride 0.9 % bolus 1,000 mL (0 mLs Intravenous Stopped 03/22/22 1624)    ED Course/ Medical Decision Making/ A&P                           Medical Decision Making Amount and/or Complexity of Data Reviewed Labs: ordered. Radiology: ordered.  Risk Decision regarding hospitalization.   This patient presents to the ED with concern for hypotension, lightheadedness. This involves an extensive number of treatment options, and is a complaint that carries with it a high risk of complications and morbidity.  The differential diagnosis includes symptomatic hypotension likely related to over-diuresis, BP medication regimen at home, vs anemia vs infection vs other  Co-morbidities that complicate the patient evaluation: hx of PE and clotting   External records from outside source obtained and reviewed including hospital discharge summary most recently - admission for hypotension and confusion, resolved with fluids, and AKI, felt to be 2/2 dehydration.  I suspect this is a similar presentation today.  I ordered and personally interpreted labs.  The pertinent results include:  AKI - worsening of CR, elevated BUN, suggesting prerenal issue.    I ordered imaging studies including CTH I independently visualized and interpreted imaging which showed no acute findings I agree with the radiologist interpretation  The  patient was maintained on a cardiac monitor.  I personally viewed and interpreted the cardiac monitored which showed an underlying rhythm of: NSR  Per my interpretation the patient's ECG shows NSR  I ordered medication including IV fluid bolus for hypotension  Test Considered:  - My suspicion for acute PE was fairly low, given his prior history, suspecting that his hypotension was more related to  volume loss.  He has no hypoxia or tachycardia.  He is now off A/C, but his last PE may have been provoked in the setting of acute covid illness.  His kidney function is below our threshold for contrast scan.  If clinical concern for PE remains after admission and fluids, inpatient team could consider V/Q study or alternative scan, but I did not feel CT PE was emergently indicated at this time   After the interventions noted above, I reevaluated the patient and found that they have: improved - BP improving  Dispostion:  After consideration of the diagnostic results and the patients response to treatment, I feel that the patent would benefit from medical admission.         Final Clinical Impression(s) / ED Diagnoses Final diagnoses:  Hypotension due to hypovolemia  AKI (acute kidney injury) [facility])    Rx / DC Orders ED Discharge Orders     None         Rahma Meller, Carola Rhine, MD 03/22/22 1819

## 2022-03-22 NOTE — Assessment & Plan Note (Signed)
prt

## 2022-03-22 NOTE — Progress Notes (Signed)
Patient arrived on the unit from ED, alert, and oriented x4. Patient walked down from stretcher to the recliner chair. Vital signs within normal.

## 2022-03-22 NOTE — Subjective & Objective (Signed)
Presented with with blurry vision confusion decreasing in speech and hearing.  Started along 11 PM last night he had few strokes in the past up to 5 and this concerned him he has otherwise bilateral strength and no facial drooping On arrival received 500 mL normal saline At home has been hypotensive blood pressure systolics down to 94H lightheaded has a headache and sensation of vertigo very unwell and sluggish since yesterday.  No associated chest pain he has prior history of pulmonary embolism September 2020 he has been on anticoagulation initially but now has been off. Patient also has history of CAD status post stenting in 2019 He has been diuresed at home on Lasix 40 mg 3 times a day his last EF was 60-65 in September 2022 Had a similar admission in March 2023 for encephalopathy and AKI secondary to overdiuresis At that time he was also hypotensive on arrival and required 2.5 L of IV fluids to improve his status

## 2022-03-22 NOTE — Assessment & Plan Note (Signed)
In the setting of hypotension, Evaluate for any source of infectious process Treat as needed hold blood pressure medications reassess If neurological complaints continue may need further imaging such as MRI

## 2022-03-22 NOTE — Progress Notes (Signed)
Patient has a critical lab value of Lactic Acid of 3.6. MD, Clarene Essex notified.

## 2022-03-22 NOTE — Assessment & Plan Note (Signed)
Continue Lipitor and Tricor

## 2022-03-22 NOTE — H&P (Addendum)
[name] BTD:[id number] DOB: 12/19/1960 DOA: 03/22/2022     PCP: Merryl Hacker, No   Outpatient Specialists:  CARDSCleaver, Jossie Ng, NP   GI Dr. Michail Sermon [facility], )    Patient arrived to ER on 03/22/22 at 1017 Referred by Attending Regan Lemming, MD   Patient coming from:    home Lives alone,       Chief Complaint:   Chief Complaint  Patient presents with   Stroke Symptoms    HPI: [name] is a 61 y.o. male with medical history significant of CAD, HTN, HLD, history of PE not on anticoagulation, multiple CVAs, up 4-5L of O2 at times as needed    Presented with  lightheadness, bright redblood and some maroon blood per rectum Presented with with blurry vision confusion decreasing in speech and hearing.  Started along 11 PM last night he had few strokes in the past up to 5 and this concerned him he has otherwise bilateral strength and no facial drooping On arrival received 500 mL normal saline At home has been hypotensive blood pressure systolics down to 10G lightheaded has a headache and sensation of vertigo very unwell and sluggish since yesterday.  No associated chest pain he has prior history of pulmonary embolism September 2020 he has been on anticoagulation initially but now has been off. Patient also has history of CAD status post stenting in 2019 He has been diuresed at home on Lasix 40 mg 3 times a day his last EF was 60-65 in September 2022 Had a similar admission in March 2023 for encephalopathy and AKI secondary to overdiuresis At that time he was also hypotensive on arrival and required 2.5 L of IV fluids to improve his status Also have been having rectal bleeding    Last colonoscopy was last year Pt reports has been having blood clots and some red blood per rectum  For 10 days  No hematemesis  No ETOH no tobacco No Nsaids   Regarding pertinent Chronic problems:     Hyperlipidemia -  on statins Lipitor, Tricor Lipid Panel     Component Value  Date/Time   CHOL 97 (L) 12/27/2021 0951   TRIG 248 (H) 12/27/2021 0951   HDL 28 (L) 12/27/2021 0951   CHOLHDL 3.5 12/27/2021 0951   CHOLHDL 4.3 03/08/2019 1008   VLDL 43 (H) 12/08/2017 0212   LDLCALC 31 12/27/2021 0951   LDLCALC  03/08/2019 1008     Comment:     . LDL cholesterol not calculated. Triglyceride levels greater than 400 mg/dL invalidate calculated LDL results. . Reference range: <100 . Desirable range <100 mg/dL for primary prevention;   <70 mg/dL for patients with CHD or diabetic patients  with > or = 2 CHD risk factors. Marland Kitchen LDL-C is now calculated using the Martin-Hopkins  calculation, which is a validated novel method providing  better accuracy than the Friedewald equation in the  estimation of LDL-C.  Cresenciano Genre et al. Annamaria Helling. 2694;854(62): 2061-2068  (http://education.[employer].com/faq/FAQ164)    LDLDIRECT 40 12/27/2021 0951   LABVLDL 38 12/27/2021 0951  GERD on Protonix   HTN on Coreg, Imdur, lisinopril, Lasix     CAD  - On  statin, betablocker,                   -  followed by cardiology                - last cardiac cath 2019      DM 2 -  Lab Results  Component Value Date   HGBA1C 9.3 (H) 01/12/2022   on insulin, PO meds      Morbid obesity-   BMI Readings from Last 1 Encounters:  03/22/22 47.46 kg/m         OSA -on nocturnal oxygen 4-5 L asd needed dos not use CPAP    Hx of CVA -  with/out residual deficits  dos not tolerate Aspirin    Hx of DVT/PE on - not on anticoagulation     CKD stage IIIb- baseline Cr 2.0 Estimated Creatinine Clearance: 35.8 mL/min (A) (by C-G formula based on SCr of 3.3 mg/dL (H)).  Lab Results  Component Value Date   CREATININE 3.30 (H) 03/22/2022   CREATININE 3.16 (H) 03/22/2022   CREATININE 2.05 (H) 03/04/2022     Chronic anemia - baseline hg Hemoglobin & Hematocrit  Recent Labs    03/04/22 1900 03/22/22 1046 03/22/22 1051  HGB 11.5* 10.4* 10.9*     While in ER: Clinical Course as of  03/22/22 2056  Tue Mar 22, 2022  1951 Fecal Occult Blood, POC(!): POSITIVE [JL]  1955 Creatinine(!): 3.16 [JL]  1955 BUN(!): 51 [JL]  1956 Glucose(!): 200 [JL]  1956 Hemoglobin(!): 10.4 [JL]    Clinical Course User Index [JL] Regan Lemming, MD      Ordered  CT HEAD   NON acute  CXR -  NON acute    Following Medications were ordered in ER: Medications  insulin aspart (novoLOG) injection 0-9 Units (has no administration in time range)  sodium chloride flush (NS) 0.9 % injection 3 mL (3 mLs Intravenous Given 03/22/22 1125)  sodium chloride 0.9 % bolus 1,000 mL (0 mLs Intravenous Stopped 03/22/22 1455)  sodium chloride 0.9 % bolus 1,000 mL (0 mLs Intravenous Stopped 03/22/22 1624)    _______________________________________________________ ER Provider Called:   [facility]  Dr. Therisa Doyne  They Recommend admit to medicine   Will see in AM      ED Triage Vitals  Enc Vitals Group     BP 03/22/22 1022 (!) 83/47     Pulse Rate 03/22/22 1022 (!) 58     Resp 03/22/22 1022 16     Temp 03/22/22 1022 97.7 F (36.5 C)     Temp Source 03/22/22 1022 Oral     SpO2 03/22/22 1022 99 %     Weight --      Height 03/22/22 1032 '5\' 10"'$  (1.778 m)     Head Circumference --      Peak Flow --      Pain Score 03/22/22 1031 7     Pain Loc --      Pain Edu? --      Excl. in [place]? --   TMAX(24)@     _________________________________________ Significant initial  Findings: Abnormal Labs Reviewed  CBC - Abnormal; Notable for the following components:      Result Value   RBC 3.35 (*)    Hemoglobin 10.4 (*)    HCT 32.3 (*)    All other components within normal limits  COMPREHENSIVE METABOLIC PANEL - Abnormal; Notable for the following components:   Glucose, Bld 200 (*)    BUN 51 (*)    Creatinine, Ser 3.16 (*)    Alkaline Phosphatase 29 (*)    GFR, Estimated 22 (*)    All other components within normal limits  URINALYSIS, ROUTINE W REFLEX MICROSCOPIC - Abnormal; Notable for the following components:    APPearance HAZY (*)  Glucose, UA >=500 (*)    Protein, ur 30 (*)    Bacteria, UA RARE (*)    All other components within normal limits  I-STAT CHEM 8, ED - Abnormal; Notable for the following components:   BUN 50 (*)    Creatinine, Ser 3.30 (*)    Glucose, Bld 198 (*)    Calcium, Ion 1.05 (*)    Hemoglobin 10.9 (*)    HCT 32.0 (*)    All other components within normal limits  CBG MONITORING, ED - Abnormal; Notable for the following components:   Glucose-Capillary 164 (*)    All other components within normal limits       ECG: Ordered Personally reviewed by me showing: HR : 56 Rhythm:  Sinus bradycardia Nonspecific T wave abnormality Abnormal ECG When compared with ECG of 12-MAY QTC 438    The recent clinical data is shown below. Vitals:   03/22/22 1600 03/22/22 1645 03/22/22 1746 03/22/22 1850  BP: (!) 116/59 (!) 94/46 (!) 96/53 (!) 113/54  Pulse: 64 (!) 58 62 68  Resp: 16 (!) 22 (!) 22 (!) 21  Temp:      TempSrc:      SpO2: 100% 96% 97% 99%  Height:          WBC     Component Value Date/Time   WBC 9.4 03/22/2022 1046   LYMPHSABS 2.0 03/22/2022 1046   MONOABS 0.8 03/22/2022 1046   EOSABS 0.2 03/22/2022 1046   BASOSABS 0.0 03/22/2022 1046      UA   no evidence of UTI    Urine analysis:    Component Value Date/Time   COLORURINE YELLOW 03/22/2022 1646   APPEARANCEUR HAZY (A) 03/22/2022 1646   LABSPEC 1.013 03/22/2022 1646   PHURINE 5.0 03/22/2022 1646   GLUCOSEU >=500 (A) 03/22/2022 1646   HGBUR NEGATIVE 03/22/2022 1646   BILIRUBINUR NEGATIVE 03/22/2022 1646   KETONESUR NEGATIVE 03/22/2022 1646   PROTEINUR 30 (A) 03/22/2022 1646   UROBILINOGEN 0.2 05/27/2014 2312   NITRITE NEGATIVE 03/22/2022 1646   LEUKOCYTESUR NEGATIVE 03/22/2022 1646     _______________________________________________ Hospitalist was called for admission for   Hypotension due to hypovolemia  Blood per rectum  AKI (acute kidney injury) ([place])    The following Work up has  been ordered so far:  Orders Placed This Encounter  Procedures   CT HEAD WO CONTRAST   NM PULMONARY VENT AND PERF (V/Q Scan)   DG Chest Portable 1 View   Protime-INR   APTT   CBC   Differential   Comprehensive metabolic panel   Urinalysis, Routine w reflex microscopic   Na and K (sodium & potassium), rand urine   Creatinine, urine, random   CK   Hepatic function panel   Magnesium   Phosphorus   Prealbumin   Sodium, urine, random   Osmolality, urine   Osmolality   TSH   Urinalysis, Complete w Microscopic Urine, Clean Catch   Cardiac monitoring   Swallow screen   NIH Stroke Scale   Modified Stroke Scale (mNIHSS) Document mNIHSS assessment every 2 hours for a total of 12 hours   Saline Lock IV, Maintain IV access   If O2 sat   STAT CBG when hypoglycemia is suspected. If treated, recheck every 15 minutes after each treatment until CBG >/= 70 mg/dl   Refer to Hypoglycemia Protocol Sidebar Report for treatment of CBG < 70 mg/dl   Consult for [facility] Admission   Pulse oximetry, continuous  I-stat chem 8, ED   CBG monitoring, ED   EKG 12-Lead   ED EKG     OTHER Significant initial  Findings:  labs showing:    Recent Labs  Lab 03/22/22 1046 03/22/22 1051  NA 138 139  K 4.2 4.4  CO2 23  --   GLUCOSE 200* 198*  BUN 51* 50*  CREATININE 3.16* 3.30*  CALCIUM 9.2  --     Cr   Up from baseline see below Lab Results  Component Value Date   CREATININE 3.30 (H) 03/22/2022   CREATININE 3.16 (H) 03/22/2022   CREATININE 2.05 (H) 03/04/2022    Recent Labs  Lab 03/22/22 1046  AST 21  ALT 17  ALKPHOS 29*  BILITOT 0.3  PROT 7.1  ALBUMIN 3.8   Lab Results  Component Value Date   CALCIUM 9.2 03/22/2022   PHOS 3.2 08/19/2020          Plt: Lab Results  Component Value Date   PLT 193 03/22/2022      COVID-19 Labs  No results for input(s): DDIMER, FERRITIN, LDH, CRP in the last 72 hours.  Lab Results  Component Value Date   SARSCOV2NAA  NEGATIVE 01/12/2022   SARSCOV2NAA NEGATIVE 10/24/2021   SARSCOV2NAA NEGATIVE 10/16/2021   SARSCOV2NAA POSITIVE (A) 06/15/2021    Venous  Blood Gas result:   pH, Ven 7.331 Sodium 142 mmol/L  pCO2, Ven 46.8 mmHg            Recent Labs  Lab 03/22/22 1046 03/22/22 1051  WBC 9.4  --   NEUTROABS 6.4  --   HGB 10.4* 10.9*  HCT 32.3* 32.0*  MCV 96.4  --   PLT 193  --     HG/HCT    Down  from baseline see below    Component Value Date/Time   HGB 10.9 (L) 03/22/2022 1051   HGB 10.7 (L) 01/28/2022 0844   HCT 32.0 (L) 03/22/2022 1051   MCV 96.4 03/22/2022 1046       Cardiac Panel (last 3 results) Recent Labs    03/22/22 2000  CKTOTAL 124    .car BNP (last 3 results) Recent Labs    07/15/21 1611 10/16/21 2109 10/24/21 0941  BNP 37.7 30.1 90.2      DM  labs:  HbA1C: Recent Labs    06/27/21 0142 07/15/21 1611 01/12/22 0916  HGBA1C 10.4* 9.6* 9.3*       CBG (last 3)  Recent Labs    03/22/22 1125 03/22/22 2016  GLUCAP 164* 176*      Cultures:    Component Value Date/Time   SDES BLOOD LEFT ARM 01/12/2022 0635   SPECREQUEST  01/12/2022 9024    BOTTLES DRAWN AEROBIC AND ANAEROBIC Blood Culture results may not be optimal due to an excessive volume of blood received in culture bottles   CULT  01/12/2022 0635    NO GROWTH 5 DAYS Performed at [facility], [place] [address], [place], [place] [address]    REPTSTATUS 01/17/2022 FINAL 01/12/2022 3299     Radiological Exams on Admission: CT HEAD WO CONTRAST  Result Date: 03/22/2022 CLINICAL DATA:  Vision loss EXAM: CT HEAD WITHOUT CONTRAST TECHNIQUE: Contiguous axial images were obtained from the base of the skull through the vertex without intravenous contrast. RADIATION DOSE REDUCTION: This exam was performed according to the departmental dose-optimization program which includes automated exposure control, adjustment of the mA and/or kV according to patient size and/or use of iterative reconstruction  technique.  COMPARISON:  Head CT dated January 12, 2022 FINDINGS: Brain: No evidence of acute infarction, hemorrhage, hydrocephalus, extra-axial collection or mass lesion/mass effect. Vascular: No hyperdense vessel or unexpected calcification. Skull: Normal. Negative for fracture or focal lesion. Sinuses/Orbits: No acute finding. Other: None. IMPRESSION: No acute intracranial abnormality. Electronically Signed   By: Yetta Glassman M.D.   On: 03/22/2022 11:07   DG Chest Portable 1 View  Result Date: 03/22/2022 CLINICAL DATA:  Chest pain and hypotension. EXAM: PORTABLE CHEST 1 VIEW COMPARISON:  03/04/2022 FINDINGS: Upper normal heart size. The lungs appear clear. No blunting of the costophrenic angles. Thoracic spondylosis. IMPRESSION: 1.  No active cardiopulmonary disease is radiographically apparent. 2. Thoracic spondylosis. Electronically Signed   By: Van Clines M.D.   On: 03/22/2022 19:13   _______________________________________________________________________________________________________ Latest  Blood pressure (!) 113/54, pulse 68, temperature 97.7 F (36.5 C), temperature source Oral, resp. rate (!) 21, height '5\' 10"'$  (1.778 m), SpO2 99 %.   Vitals  labs and radiology finding personally reviewed  Review of Systems:    Pertinent positives include:   fatigue, blood in stool,   Constitutional:  No weight loss, night sweats, Fevers, chills, weight loss  HEENT:  No headaches, Difficulty swallowing,Tooth/dental problems,Sore throat,  No sneezing, itching, ear ache, nasal congestion, post nasal drip,  Cardio-vascular:  No chest pain, Orthopnea, PND, anasarca, dizziness, palpitations.no Bilateral lower extremity swelling  GI:  No heartburn, indigestion, abdominal pain, nausea, vomiting, diarrhea, change in bowel habits, loss of appetite, melena,hematemesis Resp:  no shortness of breath at rest. No dyspnea on exertion, No excess mucus, no productive cough, No non-productive cough, No  coughing up of blood.No change in color of mucus.No wheezing. Skin:  no rash or lesions. No jaundice GU:  no dysuria, change in color of urine, no urgency or frequency. No straining to urinate.  No flank pain.  Musculoskeletal:  No joint pain or no joint swelling. No decreased range of motion. No back pain.  Psych:  No change in mood or affect. No depression or anxiety. No memory loss.  Neuro: no localizing neurological complaints, no tingling, no weakness, no double vision, no gait abnormality, no slurred speech, no confusion  All systems reviewed and apart from [place] all are negative _______________________________________________________________________________________________ Past Medical History:   Past Medical History:  Diagnosis Date   Anemia    Arthritis    Back pain    CAD (coronary artery disease)    a. s/p DES to LAD in 05/2016   Cervical radiculopathy    Chronic diastolic CHF (congestive heart failure) (HCC)    Chronic pain    Depression    DVT (deep venous thrombosis) (HCC)    Hematemesis    Hepatic steatosis    Hyperlipidemia    Hypertension    IBS (irritable bowel syndrome)    Morbid obesity (HCC)    OSA (obstructive sleep apnea)    Pancreatitis    PE (pulmonary thromboembolism) ([place])    Peripheral neuropathy    PUD (peptic ulcer disease)    Renal disorder    Stroke [facility])    a. ?details unclear - not seen on imaging when he was admitted in 05/2017 for TIA symptoms which were felt due to cervical radiculopathy.   Thoracic aortic ectasia (HCC)    a. 4.3cm ectatic ascending thoracic aorta by CT 06/2017.    Type 2 diabetes mellitus [facility])       Past Surgical History:  Procedure Laterality Date   CARDIAC CATHETERIZATION N/A 05/31/2016  Procedure: Left Heart Cath and Coronary Angiography;  Surgeon: Peter M Martinique, MD;  Location: [place] CV LAB;  Service: Cardiovascular;  Laterality: N/A;   CARDIAC CATHETERIZATION N/A 05/31/2016   Procedure: Intravascular  Pressure Wire/FFR Study;  Surgeon: Peter M Martinique, MD;  Location: [place] CV LAB;  Service: Cardiovascular;  Laterality: N/A;   CARDIAC CATHETERIZATION N/A 05/31/2016   Procedure: Coronary Stent Intervention;  Surgeon: Peter M Martinique, MD;  Location: [place] CV LAB;  Service: Cardiovascular;  Laterality: N/A;   COLONOSCOPY N/A 07/17/2021   Procedure: COLONOSCOPY;  Surgeon: Wilford Corner, MD;  Location: [facility] ENDOSCOPY;  Service: Endoscopy;  Laterality: N/A;   COLONOSCOPY WITH PROPOFOL N/A 04/29/2020   Procedure: COLONOSCOPY WITH PROPOFOL;  Surgeon: Wilford Corner, MD;  Location: [place];  Service: Endoscopy;  Laterality: N/A;   ESOPHAGOGASTRODUODENOSCOPY N/A 04/29/2020   Procedure: ESOPHAGOGASTRODUODENOSCOPY (EGD);  Surgeon: Wilford Corner, MD;  Location: [place];  Service: Endoscopy;  Laterality: N/A;   ESOPHAGOGASTRODUODENOSCOPY (EGD) WITH PROPOFOL N/A 07/17/2021   Procedure: ESOPHAGOGASTRODUODENOSCOPY (EGD) WITH PROPOFOL;  Surgeon: Wilford Corner, MD;  Location: [facility] ENDOSCOPY;  Service: Endoscopy;  Laterality: N/A;   LEFT HEART CATH AND CORONARY ANGIOGRAPHY N/A 12/08/2017   Procedure: LEFT HEART CATH AND CORONARY ANGIOGRAPHY;  Surgeon: Leonie Man, MD;  Location: [place] CV LAB;  Service: Cardiovascular;  Laterality: N/A;   LEFT HEART CATHETERIZATION WITH CORONARY ANGIOGRAM N/A 02/03/2014   Procedure: LEFT HEART CATHETERIZATION WITH CORONARY ANGIOGRAM;  Surgeon: Pixie Casino, MD;  Location: [facility] CATH LAB;  Service: Cardiovascular;  Laterality: N/A;   left leg stent      POLYPECTOMY  04/29/2020   Procedure: POLYPECTOMY;  Surgeon: Wilford Corner, MD;  Location: [facility] ENDOSCOPY;  Service: Endoscopy;;   POLYPECTOMY  07/17/2021   Procedure: POLYPECTOMY;  Surgeon: Wilford Corner, MD;  Location: [facility] ENDOSCOPY;  Service: Endoscopy;;    Social History:  Ambulatory   walker        reports that he has never smoked. He has never used smokeless tobacco. He reports that he  does not drink alcohol and does not use drugs.   Family History:   Family History  Problem Relation Age of Onset   Cancer Father    Hypertension Mother    Diabetes Mother    Breast cancer Mother    Hypertension Brother    Diabetes Brother    Hypertension Sister    Diabetes Sister    ______________________________________________________________________________________________ Allergies: Allergies  Allergen Reactions   Coconut Flavor [Flavoring Agent] Hives   Coconut (Cocos Nucifera) Hives   Ibuprofen Other (See Comments)    Made gastric ulcers worse   Aleve [Naproxen] Other (See Comments)    DUE TO KIDNEYS   Nsaids Other (See Comments)    Stomach ulcers    Other Hives    Nut Allergy   Vascepa [Icosapent Ethyl] Other (See Comments)    headaches, chest pain, similar to sx of a stroke, hypotension      Prior to Admission medications   Medication Sig Start Date End Date Taking? Authorizing Provider  albuterol (VENTOLIN HFA) 108 (90 Base) MCG/ACT inhaler Inhale 2 puffs into the lungs every 6 (six) hours as needed for wheezing or shortness of breath.   Yes [provider]  atorvastatin (LIPITOR) 40 MG tablet Take 40 mg by mouth at bedtime.   Yes [provider]  carvedilol (COREG) 12.5 MG tablet Take 12.5 mg by mouth 2 (two) times daily. 10/22/20  Yes [provider]  cyanocobalamin 1000  MCG tablet Take by mouth. 03/06/20  Yes [provider]  diclofenac (FLECTOR) 1.3 % PTCH Place 1 patch onto the skin 2 (two) times daily as needed (back and knee pain).   Yes [provider]  fenofibrate (TRICOR) 145 MG tablet Take 1 tablet (145 mg total) by mouth daily. Patient taking differently: Take 145 mg by mouth every morning. 06/25/21  Yes Cleaver, Jossie Ng, NP  Fluticasone Propionate (FLONASE ALLERGY RELIEF NA) Place 1 spray into the nose daily as needed (congestion).   Yes [provider]  furosemide (LASIX) 40 MG tablet Take 1 tablet  (40 mg total) by mouth 3 (three) times daily. 10/27/21 10/27/22 Yes Goodrich, Callie E, PA-C  gabapentin (NEURONTIN) 800 MG tablet Take 800 mg by mouth 3 (three) times daily. 09/29/21  Yes [provider]  glipiZIDE (GLUCOTROL) 10 MG tablet Take 10 mg by mouth 2 (two) times daily. 05/30/17  Yes [provider]  HUMULIN R U-500 KWIKPEN 500 UNIT/ML kwikpen Inject 85-110 Units into the skin See admin instructions. Per sliding scale 3 times daily with meals 06/04/20  Yes [provider]  isosorbide mononitrate (IMDUR) 30 MG 24 hr tablet TAKE 1 TABLET BY MOUTH DAILY Patient taking differently: Take 30 mg by mouth daily. 01/03/22  Yes Hilty, Nadean Corwin, MD  lisinopril (ZESTRIL) 20 MG tablet Take 20 mg by mouth daily.   Yes [provider]  Menthol, Topical Analgesic, (BIOFREEZE EX) Apply 1 application topically 2 (two) times daily as needed (knee pain).   Yes [provider]  nitroGLYCERIN (NITROSTAT) 0.4 MG SL tablet Place 1 tablet (0.4 mg total) under the tongue every 5 (five) minutes as needed for chest pain. 02/06/19  Yes Skeet Latch, MD  ondansetron (ZOFRAN) 4 MG tablet Take 4 mg by mouth 2 (two) times daily as needed for nausea/vomiting. 04/21/20  Yes [provider]  Oxycodone HCl 20 MG TABS Take 20 mg by mouth 4 (four) times daily as needed (pain).   Yes [provider]  pantoprazole (PROTONIX) 40 MG tablet Take 40 mg by mouth every morning. 04/13/21  Yes [provider]  potassium chloride (MICRO-K) 10 MEQ CR capsule Take 20 mEq by mouth 2 (two) times daily. 04/03/20  Yes [provider]  sertraline (ZOLOFT) 50 MG tablet Take 50 mg by mouth every morning. 12/17/20  Yes [provider]  tiZANidine (ZANAFLEX) 4 MG tablet Take 4 mg by mouth at bedtime as needed for muscle spasms. 03/31/20  Yes [provider]  traZODone (DESYREL) 50 MG tablet Take 1 tablet (50 mg total) by mouth at bedtime. 12/15/16  Yes Burns,  Arloa Koh, MD  Vitamin D, Ergocalciferol, (DRISDOL) 1.25 MG (50000 UT) CAPS capsule Take 50,000 Units by mouth every Monday. 08/15/19  Yes [provider]  apixaban (ELIQUIS) 5 MG TABS tablet Take 2 tablets ('10mg'$ ) twice daily for 7 days, then 1 tablet ('5mg'$ ) twice daily Patient not taking: Reported on 02/15/2022 07/04/21   Little Ishikawa, MD  Insulin Pen Needle 31G X 5 MM MISC Use 1 needle daily to inject insulin as prescribed 06/18/17   Barton Dubois, MD    ___________________________________________________________________________________________________ Physical Exam:    03/22/2022    6:50 PM 03/22/2022    5:46 PM 03/22/2022    4:45 PM  Vitals with BMI  Systolic 324 96 94  Diastolic 54 53 46  Pulse 68 62 58     1. General:  in No  Acute distress    Chronically  ill   -appearing 2. Psychological: Alert and   Oriented 3. Head/ENT:    Dry Mucous Membranes                          Head Non traumatic, neck supple                          Normal  Dentition 4. SKIN decreased Skin turgor,  Skin clean Dry and intact no rash 5. Heart: Regular rate and rhythm no  Murmur, no Rub or gallop 6. Lungs: , no wheezes or crackles  distant 7. Abdomen: Soft,  non-tender, Non distended   obese  bowel sounds present 8. Lower extremities: no clubbing, cyanosis, no  edema 9. Neurologically Grossly intact, moving all 4 extremities equally  10. MSK: Normal range of motion     Chart has been reviewed  ______________________________________________________________________________________________  Assessment/Plan 61 y.o. male with medical history significant of CAD, HTN, HLD, history of PE not on anticoagulation, multiple CVAs, up 4-5L of O2 at times as needed    Admitted for   Hypotension due to hypovolemia, blood in stool and  AKI    Present on Admission:  Obesity, Class III, BMI 40-49.9 (morbid obesity) ([place])  Dyslipidemia  HTN (hypertension)  Coronary artery disease involving  native coronary artery of native heart without angina pectoris  GERD (gastroesophageal reflux disease)  OSA (obstructive sleep apnea)  Acute kidney injury ([place])  Normocytic normochromic anemia  Acute metabolic encephalopathy  Blood per rectum     Obesity, Class III, BMI 40-49.9 (morbid obesity) ([place]) Chronic will need nutritional consult as an outpatient  Dyslipidemia Continue Lipitor and Tricor  DM2 (diabetes mellitus, type 2) ([place]) Order sliding scale resume home insulin but at decreased dosing given AKI and poor p.o. intake  HTN (hypertension) Allow permissive hypertension given hypotension on arrival  Coronary artery disease involving native coronary artery of native heart without angina pectoris Chronic stable resume beta-blocker when able for now continue Lipitor  GERD (gastroesophageal reflux disease) Chronic continue PPI  Acute kidney injury ([place]) Order urine electrolytes likely secondary to overdiuresis. Rehydrate and follow fluid status  Normocytic normochromic anemia Order anemia panel  Hemoccult stool hemoglobin positive  Transfuse as needed for HG < 7 or rapidly going down  Serial CBC  History of CVA (cerebrovascular accident)   continue Lipitor for now, no aspirin  Acute metabolic encephalopathy In the setting of hypotension, Evaluate for any source of infectious process Treat as needed hold blood pressure medications reassess If neurological complaints continue may need further imaging such as MRI  Blood per rectum  - Glasgow Blatchford score BUN >18.2  >1 Justifies admission and aggressive management      Modifying risk factors include: Prior hx of GI bleed        -  ER  Provider spoke to gastroenterology [facility])  they will see patient in a.m. appreciate their consult   - serial CBC.    - Monitor for any recurrence,  evidence of hemodynamic instability or significant blood loss  - Transfuse as needed for hemoglobin below 7 or evidence of  life-threatening bleeding  - Establish at least 2 PIV and fluid resuscitate   - clear liquids for tonight keep nothing by mouth post midnight,   -  administer Protonix  twice a day       Acute upper GI bleed prt  OSA (obstructive sleep apnea) Oxygen as needed,  pt does not use CPAP    Other plan as per orders.  DVT prophylaxis:  SCD     Code Status:    Code Status: Prior FULL CODE  as per patient    I had personally discussed CODE STATUS with patient     Family Communication:   Family not at  Bedside    Disposition Plan:    To home once workup is complete and patient is stable    Following barriers for discharge:                                                         Anemia stable                                                      Will need consultants to evaluate patient prior to discharge                        Would benefit from PT/OT eval prior to DC  Ordered                   Diabetes consult                    Consults called: [facility]  Admission status:  ED Disposition     ED Disposition  [place]: [place] [[id number]]  Level of Care: Progressive [102]  Admit to Progressive based on following criteria: GI, ENDOCRINE disease patients with GI bleeding, acute liver failure or pancreatitis, stable with diabetic ketoacidosis or thyrotoxicosis (hypothyroid) state.  Admit to Progressive based on following criteria: MULTISYSTEM THREATS such as stable sepsis, metabolic/electrolyte imbalance with or without encephalopathy that is responding to early treatment.  May admit patient to [facility] or [facility] if equivalent level of care is available:: No  Covid Evaluation: Asymptomatic - no recent exposure (last 10 days) testing not required  Diagnosis: Acute upper GI bleed [300923]  Admitting Physician: Toy Baker [[id number]]  Attending Physician: Toy Baker [[id number]]  Estimated length of stay: past  midnight tomorrow  Certification:: I certify this patient will need inpatient services for at least 2 midnights             inpatient     I Expect 2 midnight stay secondary to severity of patient's current illness need for inpatient interventions justified by the following:  hemodynamic instability despite optimal treatment (hypotension )  Severe lab/radiological/exam abnormalities including:    GI bleed and extensive comorbidities including:    DM2     CAD    Morbid Obesity  CKD    That are currently affecting medical management.   I expect  patient to be hospitalized for 2 midnights requiring inpatient medical care.  Patient is at high risk for adverse outcome (such as loss of life or disability) if not treated.  Indication for inpatient stay as follows:    Need for operative/procedural  intervention     Need for  , IV fluids, IV rPPI     Level of care  progressive tele indefinitely please discontinue once patient no longer qualifies COVID-19 Labs     Ekaterini Capitano 03/22/2022, 9:41 PM    [facility]     after 2 AM please page floor coverage PA If 7AM-7PM, please contact the day team taking care of the patient using [email or web address]   Patient was evaluated in the context of the global COVID-19 pandemic, which necessitated consideration that the patient might be at risk for infection with the SARS-CoV-2 virus that causes COVID-19. Institutional protocols and algorithms that pertain to the evaluation of patients at risk for COVID-19 are in a state of rapid change based on information released by regulatory bodies including the CDC and federal and state organizations. These policies and algorithms were followed during the patient's care.

## 2022-03-22 NOTE — ED Notes (Signed)
Pt ambulatory to restroom with standby assistance

## 2022-03-22 NOTE — Assessment & Plan Note (Signed)
Order urine electrolytes likely secondary to overdiuresis. Rehydrate and follow fluid status

## 2022-03-22 NOTE — Assessment & Plan Note (Signed)
Order sliding scale resume home insulin but at decreased dosing given AKI and poor p.o. intake

## 2022-03-22 NOTE — ED Triage Notes (Signed)
EMS stated This morning cause his vision was declined  and hearing and speech was off. The symptoms started last night at 1100pm last night.  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  His speech was not clear. , bilateral strength good, no facial drooping. IV 20 left forearm  only received about 500cc on arrival here.

## 2022-03-22 NOTE — Assessment & Plan Note (Addendum)
Order anemia panel  Hemoccult stool hemoglobin positive  Transfuse as needed for HG < 7 or rapidly going down  Serial CBC

## 2022-03-22 NOTE — Assessment & Plan Note (Signed)
Chronic stable resume beta-blocker when able for now continue Lipitor

## 2022-03-22 NOTE — ED Notes (Signed)
Pt more alert and responds faster. Pt denies any sensory issues, pt now states bilateral feet pain and HA. Pt continues to states dizziness.

## 2022-03-22 NOTE — Assessment & Plan Note (Addendum)
-   Glasgow Blatchford score BUN >18.2  >1 Justifies admission and aggressive management      Modifying risk factors include: Prior hx of GI bleed        -  ER  Provider spoke to gastroenterology [facility])  they will see patient in a.m. appreciate their consult   - serial CBC.    - Monitor for any recurrence,  evidence of hemodynamic instability or significant blood loss  - Transfuse as needed for hemoglobin below 7 or evidence of life-threatening bleeding  - Establish at least 2 PIV and fluid resuscitate   - clear liquids for tonight keep nothing by mouth post midnight,   -  administer Protonix  twice a day

## 2022-03-22 NOTE — Assessment & Plan Note (Signed)
Allow permissive hypertension given hypotension on arrival

## 2022-03-23 ENCOUNTER — Inpatient Hospital Stay (HOSPITAL_COMMUNITY): Payer: Medicaid Other

## 2022-03-23 DIAGNOSIS — I9589 Other hypotension: Secondary | ICD-10-CM | POA: Diagnosis not present

## 2022-03-23 DIAGNOSIS — I2694 Multiple subsegmental pulmonary emboli without acute cor pulmonale: Secondary | ICD-10-CM

## 2022-03-23 DIAGNOSIS — K625 Hemorrhage of anus and rectum: Secondary | ICD-10-CM | POA: Diagnosis not present

## 2022-03-23 DIAGNOSIS — R609 Edema, unspecified: Secondary | ICD-10-CM

## 2022-03-23 DIAGNOSIS — G9341 Metabolic encephalopathy: Secondary | ICD-10-CM | POA: Diagnosis not present

## 2022-03-23 DIAGNOSIS — N179 Acute kidney failure, unspecified: Secondary | ICD-10-CM | POA: Diagnosis not present

## 2022-03-23 LAB — CBC
HCT: 32.4 % — ABNORMAL LOW (ref 39.0–52.0)
HCT: 31.5 % — ABNORMAL LOW (ref 39.0–52.0)
Hemoglobin: 10.6 g/dL — ABNORMAL LOW (ref 13.0–17.0)
Hemoglobin: 10.2 g/dL — ABNORMAL LOW (ref 13.0–17.0)
MCH: 31.1 pg (ref 26.0–34.0)
MCH: 30.8 pg (ref 26.0–34.0)
MCHC: 32.7 g/dL (ref 30.0–36.0)
MCHC: 32.4 g/dL (ref 30.0–36.0)
MCV: 95 fL (ref 80.0–100.0)
MCV: 95.2 fL (ref 80.0–100.0)
Platelets: 199 10*3/uL (ref 150–400)
Platelets: 209 10*3/uL (ref 150–400)
RBC: 3.41 MIL/uL — ABNORMAL LOW (ref 4.22–5.81)
RBC: 3.31 MIL/uL — ABNORMAL LOW (ref 4.22–5.81)
RDW: 13.6 % (ref 11.5–15.5)
RDW: 13.4 % (ref 11.5–15.5)
WBC: 8.5 10*3/uL (ref 4.0–10.5)
WBC: 8.5 10*3/uL (ref 4.0–10.5)
nRBC: 0 % (ref 0.0–0.2)
nRBC: 0 % (ref 0.0–0.2)

## 2022-03-23 LAB — TROPONIN I (HIGH SENSITIVITY)
Troponin I (High Sensitivity): 7 ng/L (ref <18)
Troponin I (High Sensitivity): 8 ng/L (ref <18)
Troponin I (High Sensitivity): 8 ng/L (ref <18)
Troponin I (High Sensitivity): 9 ng/L (ref <18)

## 2022-03-23 LAB — LACTIC ACID, PLASMA
Lactic Acid, Venous: 2.5 mmol/L (ref 0.5–1.9)
Lactic Acid, Venous: 1.9 mmol/L (ref 0.5–1.9)

## 2022-03-23 LAB — ECHOCARDIOGRAM COMPLETE
AR max vel: 2.57 cm2
AV Peak grad: 14.1 mmHg
Ao pk vel: 1.88 m/s
Area-P 1/2: 3.83 cm2
Calc EF: 66.1 %
Height: 70 in
S' Lateral: 3.4 cm
Single Plane A2C EF: 64.4 %
Single Plane A4C EF: 66.5 %
Weight: 5291.04 oz

## 2022-03-23 LAB — GLUCOSE, CAPILLARY
Glucose-Capillary: 145 mg/dL — ABNORMAL HIGH (ref 70–99)
Glucose-Capillary: 163 mg/dL — ABNORMAL HIGH (ref 70–99)
Glucose-Capillary: 210 mg/dL — ABNORMAL HIGH (ref 70–99)
Glucose-Capillary: 212 mg/dL — ABNORMAL HIGH (ref 70–99)
Glucose-Capillary: 226 mg/dL — ABNORMAL HIGH (ref 70–99)
Glucose-Capillary: 249 mg/dL — ABNORMAL HIGH (ref 70–99)

## 2022-03-23 LAB — COMPREHENSIVE METABOLIC PANEL
ALT: 17 U/L (ref 0–44)
AST: 19 U/L (ref 15–41)
Albumin: 3.7 g/dL (ref 3.5–5.0)
Alkaline Phosphatase: 29 U/L — ABNORMAL LOW (ref 38–126)
Anion gap: 10 (ref 5–15)
BUN: 38 mg/dL — ABNORMAL HIGH (ref 6–20)
CO2: 24 mmol/L (ref 22–32)
Calcium: 8.9 mg/dL (ref 8.9–10.3)
Chloride: 105 mmol/L (ref 98–111)
Creatinine, Ser: 1.87 mg/dL — ABNORMAL HIGH (ref 0.61–1.24)
GFR, Estimated: 41 mL/min — ABNORMAL LOW (ref >60)
Glucose, Bld: 156 mg/dL — ABNORMAL HIGH (ref 70–99)
Potassium: 4 mmol/L (ref 3.5–5.1)
Sodium: 139 mmol/L (ref 135–145)
Total Bilirubin: 0.5 mg/dL (ref 0.3–1.2)
Total Protein: 7 g/dL (ref 6.5–8.1)

## 2022-03-23 LAB — HEPARIN LEVEL (UNFRACTIONATED): Heparin Unfractionated: 0.12 IU/mL — ABNORMAL LOW (ref 0.30–0.70)

## 2022-03-23 LAB — PREALBUMIN: Prealbumin: 35.5 mg/dL (ref 18–38)

## 2022-03-23 LAB — HEMOGLOBIN AND HEMATOCRIT, BLOOD
HCT: 31.4 % — ABNORMAL LOW (ref 39.0–52.0)
Hemoglobin: 10.4 g/dL — ABNORMAL LOW (ref 13.0–17.0)

## 2022-03-23 MED ORDER — PERFLUTREN LIPID MICROSPHERE
1.0000 mL | INTRAVENOUS | Status: AC | PRN
  Administered 2022-03-23: 2 mL via INTRAVENOUS

## 2022-03-23 MED ORDER — NITROGLYCERIN 0.4 MG SL SUBL
0.4000 mg | SUBLINGUAL_TABLET | SUBLINGUAL | Status: DC | PRN
Start: 2022-03-23 — End: 2022-03-26

## 2022-03-23 MED ORDER — ISOSORBIDE MONONITRATE ER 30 MG PO TB24
30.0000 mg | ORAL_TABLET | Freq: Every day | ORAL | Status: DC
  Administered 2022-03-23 – 2022-03-26 (×4): 30 mg via ORAL
  Filled 2022-03-23 (×4): qty 1

## 2022-03-23 MED ORDER — HEPARIN (PORCINE) 25000 UT/250ML-% IV SOLN
2000.0000 [IU]/h | INTRAVENOUS | Status: DC
  Administered 2022-03-23: 1800 [IU]/h via INTRAVENOUS
  Administered 2022-03-24: 2100 [IU]/h via INTRAVENOUS
  Administered 2022-03-25: 2000 [IU]/h via INTRAVENOUS
  Filled 2022-03-23 (×5): qty 250

## 2022-03-23 MED ORDER — TECHNETIUM TO 99M ALBUMIN AGGREGATED
4.3000 | Freq: Once | INTRAVENOUS | Status: AC | PRN
  Administered 2022-03-23: 4.3 via INTRAVENOUS

## 2022-03-23 NOTE — Progress Notes (Signed)
[place] for heparin Indication: pulmonary embolus  Heparin Dosing Weight: 108.9 kg  Labs: Recent Labs    03/22/22 1046 03/22/22 1051 03/22/22 2000 03/22/22 2019 03/23/22 0558  HGB 10.4* 10.9*  --  11.2* 10.6*  HCT 32.3* 32.0*  --  33.0* 32.4*  PLT 193  --   --   --  199  APTT 26  --   --   --   --   LABPROT 14.3  --   --   --   --   INR 1.1  --   --   --   --   CREATININE 3.16* 3.30*  --   --  1.87*  CKTOTAL  --   --  124  --   --     Assessment: 78 yom with hx GIB, PE currently not on anticoagulation PTA admitted 5/30 with intermittent rectal bleeding and minimal drop in Hg. GI consulted and recommending supportive care for now. V/Q scan this admit resulted as high probability for PE in L lung. GI ok to start heparin drip if needed with plan to switch to PO 24-48 hrs if Hg stable. Pharmacy consulted to dose heparin infusion with no bolus. Hg low but stable 10.6, plt WNL. AKI noted, but SCr now trending down. Previously on SCDs only this admit.  Goal of Therapy:  Heparin level 0.3-0.5 units/ml Monitor platelets by anticoagulation protocol: Yes   Plan:  No bolus. Start heparin infusion at 1800 units/hr Check 6hr heparin level Monitor daily CBC, s/sx bleeding   Arturo Morton, PharmD, BCPS Clinical Pharmacist 03/23/2022 10:25 AM

## 2022-03-23 NOTE — Progress Notes (Signed)
PT Cancellation Note  Patient Details Name: HEATON SARIN MRN: [id number] DOB: 1961/07/01   Cancelled Treatment:    Reason Eval/Treat Not Completed: Medical issues which prohibited therapy. Nuclear med pulmonary perfusion test suggests high probability of PE in left lung, pt started on heparin. PT will hold until heparin is within therapeutic range.   Zenaida Niece 03/23/2022, 12:42 PM

## 2022-03-23 NOTE — Progress Notes (Signed)
ANTICOAGULATION CONSULT NOTE  Pharmacy Consult for heparin Indication: pulmonary embolus  Heparin Dosing Weight: 108.9 kg  Labs: Recent Labs    03/22/22 1046 03/22/22 1051 03/22/22 2000 03/22/22 2019 03/23/22 0558 03/23/22 1009 03/23/22 1821 03/23/22 1907  HGB 10.4* 10.9*  --    < > 10.6* 10.2* 10.4*  --   HCT 32.3* 32.0*  --    < > 32.4* 31.5* 31.4*  --   PLT 193  --   --   --  199 209  --   --   APTT 26  --   --   --   --   --   --   --   LABPROT 14.3  --   --   --   --   --   --   --   INR 1.1  --   --   --   --   --   --   --   HEPARINUNFRC  --   --   --   --   --   --   --  0.12*  CREATININE 3.16* 3.30*  --   --  1.87*  --   --   --   CKTOTAL  --   --  124  --   --   --   --   --    < > = values in this interval not displayed.     Assessment: 48 yom with hx GIB, PE currently not on anticoagulation PTA admitted 5/30 with intermittent rectal bleeding and minimal drop in Hg. GI consulted and recommending supportive care for now. V/Q scan this admit resulted as high probability for PE in L lung. GI ok to start heparin drip if needed with plan to switch to PO 24-48 hrs if Hg stable. Pharmacy consulted to dose heparin infusion with no bolus. Hg low but stable 10.6, plt WNL. AKI noted, but SCr now trending down. Previously on SCDs only this admit.  Heparin level came back subtherapeutic this PM. We will increase rate and check for level in AM.   Goal of Therapy:  Heparin level 0.3-0.5 units/ml Monitor platelets by anticoagulation protocol: Yes   Plan:  Increase heparin infusion to 2100 units/hr Check AM heparin level Monitor daily CBC, s/sx bleeding   Onnie Boer, PharmD, BCIDP, AAHIVP, CPP Infectious Disease Pharmacist 03/23/2022 8:42 PM

## 2022-03-23 NOTE — Research (Signed)
[name]  Screening run in essence

## 2022-03-23 NOTE — Progress Notes (Signed)
OT Cancellation Note  Patient Details Name: [name] MRN: [id number] DOB: 21-Jan-1961   Cancelled Treatment:    Reason Eval/Treat Not Completed: Medical issues which prohibited therapy Medical issues which prohibited therapy. Nuclear med pulmonary perfusion test suggests high probability of PE in left lung, pt started on heparin. OT will hold until heparin is within therapeutic range.  Corinne Ports E. Dejuan Elman, OTR/L Acute Rehabilitation Services [phone] [place] 03/23/2022, 12:46 PM

## 2022-03-23 NOTE — Progress Notes (Signed)
Inpatient Diabetes Program Recommendations  AACE/ADA: New Consensus Statement on Inpatient Glycemic Control (2015)  Target Ranges:  Prepandial:   less than 140 mg/dL      Peak postprandial:   less than 180 mg/dL (1-2 hours)      Critically ill patients:  140 - 180 mg/dL   Lab Results  Component Value Date   GLUCAP 226 (H) 03/23/2022   HGBA1C 9.3 (H) 01/12/2022    Review of Glycemic Control  Latest Reference Range & Units 03/22/22 11:25 03/22/22 20:16 03/22/22 23:35 03/23/22 03:19 03/23/22 07:51 03/23/22 12:07  Glucose-Capillary 70 - 99 mg/dL 164 (H) 176 (H) 164 (H) 163 (H) 145 (H) 226 (H)   Diabetes history: DM 2 Outpatient Diabetes medications:  U500 85-110 units tid with meals, Glucotrol 10 mg bid Current orders for Inpatient glycemic control:  Semglee 10 units daily Novolog sensitive q 4 hours  Inpatient Diabetes Program Recommendations:   Agree with current orders.  Patient was on U500 at home prior to admit. Currently blood sugars are within hospital goals.  Will follow.   Thanks,  Adah Perl, RN, BC-ADM Inpatient Diabetes Coordinator Pager [phone]  (8a-5p)

## 2022-03-23 NOTE — Progress Notes (Signed)
Bilateral lower extremity venous duplex completed. Refer to "CV Proc" under chart review to view preliminary results.  03/23/2022 1:41 PM Kelby Aline., MHA, RVT, RDCS, RDMS

## 2022-03-23 NOTE — Progress Notes (Signed)
[facility]                                                                               [name], is a 61 y.o. male, DOB - 1961/03/08, HCW:[id number] Admit date - 03/22/2022    Outpatient Primary MD for the patient is Pcp, No  LOS - 1  days    Brief summary   [name] is a 61 y.o. male with medical history significant of CAD, HTN, HLD, history of PE was on eliquis until April first week, stopped it as per recommendations from hematology,CVA, s/p PCI in 2019, was admitted on 5/30 for  sob, lightheadedness, confusion.  Patient also reports occasional BPR. GI consulted, recommended no further work up at this time as he had EGD and colonoscopy in 06/2021 showing diverticulosis and internal hemorrhoids.   Assessment & Plan    Assessment and Plan:   Sob, lightheadedness probably from acute PE. V/Q scan showed high probability for PE on the left lung.  Two mismatched segmental perfusion defects within the left lung.  Venous duplex of the lower extremities ordered.  Echocardiogram ordered to evaluate for right heart strain. Started the patient on IV heparin, will watch for worsening bleeding per rectum for 24 to 48 hours and transition to eliquis on discharge if no bleeding.       BRBPR  Possibly from internal hemorrhoids Painless.  GI consulted. recommended no further work up at this time as he had EGD and colonoscopy in 06/2021 showing diverticulosis and internal hemorrhoids.  Supportive care with PPI BID.  Hemoglobin stable around 10.  Anusol cream  Check H&H tonight and watch for more bleeding.      Hypotension: possibly from over diuresis from lasix.  Resolved with hydration.    Chest pain:  Appears to be intermittent ,not related to activity. Ekg shows NSR.  Troponin is pending.  Echocardiogram reviewed.  Start the patient on IMDUR and NTG.     AKI:  Suspect from over diuresis with lasix.  Hold lasix and gentle hydration and repeat  renal parameters have improved.  Baseline creatinine is around 1.1. Creatinine on admission was around 3.3, improved to 1.8 with fluids.    Type 2 DM:  Uncontrolled with hyperglycemia. Insulin dependent  CBG (last 3)  Recent Labs    03/23/22 0751 03/23/22 1207 03/23/22 1555  GLUCAP 145* 226* 210*   Continue with semglee 10 units at bedtime.  Resume SSI.  Hemoglobin A1c from march 2023 is 9.3%    Hyperlipidemia on statin.    GERD:  Stable.    Normocytic anemia? Mild anemia of blood loss Baseline hemoglobin around 11.  Currently at 42.6. check H&H tonight and tomorrow.     H/o CVA Resume statin.    Acute Metabolic encephalopathy; from AKI.  Resolved. He is alert and oriented.    Hypertension.  BP parameters are optimal.    OSA  Supposed to use CPAP, pt non compliant. Uses oxygen instead.    Estimated body mass index is 47.45 kg/m as calculated from the following:   Height as of this encounter: '5\' 10"'$  (1.778 m).   Weight as of this encounter:  150 kg.  Code Status:  full code.  DVT Prophylaxis:  SCDs Start: 03/22/22 2005 heparin.    Level of Care: Level of care: Med-Surg Family Communication: none at bedside.   Disposition Plan:     Remains inpatient appropriate:  IV heparin.   Procedures:  NM v/q scan.   Consultants:   GI.   Antimicrobials:   Anti-infectives (From admission, onward)    None        Medications  Scheduled Meds:  atorvastatin  40 mg Oral QHS   fenofibrate  54 mg Oral Daily   gabapentin  800 mg Oral TID   insulin aspart  0-9 Units Subcutaneous Q4H   insulin glargine-yfgn  10 Units Subcutaneous QHS   [START ON 03/26/2022] pantoprazole  40 mg Intravenous Q12H   sertraline  50 mg Oral q morning   Continuous Infusions:  heparin 1,800 Units/hr (03/23/22 1137)   PRN Meds:.acetaminophen **OR** acetaminophen, albuterol, HYDROcodone-acetaminophen, oxyCODONE, perflutren lipid microspheres (DEFINITY) IV  suspension    Subjective:   [name] was seen and examined today.  Chest pain intermittently.  Objective:   Vitals:   03/22/22 2331 03/23/22 0317 03/23/22 1000 03/23/22 1209  BP: (!) 120/59 94/65  117/69  Pulse: 72 65  73  Resp: '20 16  17  '$ Temp: 99.1 F (37.3 C) 98.1 F (36.7 C)  98.5 F (36.9 C)  TempSrc: Oral Oral  Oral  SpO2: 93% 98%  96%  Weight:   (!) 150 kg   Height:   '5\' 10"'$  (1.778 m)     Intake/Output Summary (Last 24 hours) at 03/23/2022 1433 Last data filed at 03/23/2022 1200 Gross per 24 hour  Intake 720.7 ml  Output 2050 ml  Net -1329.3 ml   Filed Weights   03/23/22 1000  Weight: (!) 150 kg     Exam General exam: Appears calm and comfortable  Respiratory system: Clear to auscultation. Respiratory effort normal. Cardiovascular system: S1 & S2 heard, RRR. No JVD, Gastrointestinal system: Abdomen is nondistended, soft and nontender. Normal bowel sounds heard. Central nervous system: Alert and oriented. No focal neurological deficits. Extremities: Symmetric 5 x 5 power. Skin: No rashes, lesions or ulcers Psychiatry: Mood & affect appropriate.   Data Reviewed:  I have personally reviewed following labs and imaging studies   CBC Lab Results  Component Value Date   WBC 8.5 03/23/2022   RBC 3.31 (L) 03/23/2022   HGB 10.2 (L) 03/23/2022   HCT 31.5 (L) 03/23/2022   MCV 95.2 03/23/2022   MCH 30.8 03/23/2022   PLT 209 03/23/2022   MCHC 32.4 03/23/2022   RDW 13.4 03/23/2022   LYMPHSABS 2.0 03/22/2022   MONOABS 0.8 03/22/2022   EOSABS 0.2 03/22/2022   BASOSABS 0.0 88/50/2774     Last metabolic panel Lab Results  Component Value Date   NA 139 03/23/2022   K 4.0 03/23/2022   CL 105 03/23/2022   CO2 24 03/23/2022   BUN 38 (H) 03/23/2022   CREATININE 1.87 (H) 03/23/2022   GLUCOSE 156 (H) 03/23/2022   GFRNONAA 41 (L) 03/23/2022   GFRAA 110 11/02/2020   CALCIUM 8.9 03/23/2022   PHOS 3.4 03/22/2022   PROT 7.0 03/23/2022   ALBUMIN 3.7  03/23/2022   LABGLOB 2.7 10/17/2019   AGRATIO 1.5 10/17/2019   BILITOT 0.5 03/23/2022   ALKPHOS 29 (L) 03/23/2022   AST 19 03/23/2022   ALT 17 03/23/2022   ANIONGAP 10 03/23/2022    CBG (last 3)  Recent Labs  03/23/22 0319 03/23/22 0751 03/23/22 1207  GLUCAP 163* 145* 226*      Coagulation Profile: Recent Labs  Lab 03/22/22 1046  INR 1.1     Radiology Studies: CT HEAD WO CONTRAST  Result Date: 03/22/2022 CLINICAL DATA:  Vision loss EXAM: CT HEAD WITHOUT CONTRAST TECHNIQUE: Contiguous axial images were obtained from the base of the skull through the vertex without intravenous contrast. RADIATION DOSE REDUCTION: This exam was performed according to the departmental dose-optimization program which includes automated exposure control, adjustment of the mA and/or kV according to patient size and/or use of iterative reconstruction technique. COMPARISON:  Head CT dated January 12, 2022 FINDINGS: Brain: No evidence of acute infarction, hemorrhage, hydrocephalus, extra-axial collection or mass lesion/mass effect. Vascular: No hyperdense vessel or unexpected calcification. Skull: Normal. Negative for fracture or focal lesion. Sinuses/Orbits: No acute finding. Other: None. IMPRESSION: No acute intracranial abnormality. Electronically Signed   By: Yetta Glassman M.D.   On: 03/22/2022 11:07   NM Pulmonary Perfusion  Result Date: 03/23/2022 CLINICAL DATA:  Pulmonary embolism (PE) suspected, high prob EXAM: NUCLEAR MEDICINE PERFUSION LUNG SCAN TECHNIQUE: Perfusion images were obtained in multiple projections after intravenous injection of radiopharmaceutical. Ventilation scans intentionally deferred if perfusion scan and chest x-ray adequate for interpretation during COVID 19 epidemic. RADIOPHARMACEUTICALS:  4.3 mCi Tc-81mMAA IV COMPARISON:  Chest x-ray 03/22/2022 FINDINGS: Two mismatched segmental perfusion defects within the left lung. No evidence of a segmental perfusion defect within the  right lung. IMPRESSION: High probability for pulmonary embolism in the left lung. These results will be called to the ordering clinician or representative by the Radiologist Assistant, and communication documented in the PACS or [employer] Electronically Signed   By: NDavina PokeD.O.   On: 03/23/2022 09:35   DG Chest Portable 1 View  Result Date: 03/22/2022 CLINICAL DATA:  Chest pain and hypotension. EXAM: PORTABLE CHEST 1 VIEW COMPARISON:  03/04/2022 FINDINGS: Upper normal heart size. The lungs appear clear. No blunting of the costophrenic angles. Thoracic spondylosis. IMPRESSION: 1.  No active cardiopulmonary disease is radiographically apparent. 2. Thoracic spondylosis. Electronically Signed   By: WVan ClinesM.D.   On: 03/22/2022 19:13   VAS [place]OWER EXTREMITY VENOUS (DVT)  Result Date: 03/23/2022  Lower Venous DVT Study Patient Name:  JJADIE Stein Date of Exam:   03/23/2022 Medical Rec #: [id number]        Accession #:    [id number] of Birth: 1July 15, 1962       Patient Gender: M Patient Age:   89years [facility]      [facility] [place]OWER EXTREMITY VENOUS (DVT) Referring Phys: VHosie Poisson--------------------------------------------------------------------------------  Indications: Edema.  Limitations: Poor ultrasound/tissue interface and body habitus. Comparison Study: 06/28/2021 negative lower extremity venous duplex Performing Technologist: MMaudry MayhewMHA, RDMS, RVT, RDCS  Examination Guidelines: A complete evaluation includes B-mode imaging, spectral Doppler, color Doppler, and power Doppler as needed of all accessible portions of each vessel. Bilateral testing is considered an integral part of a complete examination. Limited examinations for reoccurring indications may be performed as noted. The reflux portion of the exam is performed with the patient in reverse Trendelenburg.   +---------+---------------+---------+-----------+---------------+--------------+ RIGHT    CompressibilityPhasicitySpontaneityProperties     Thrombus Aging +---------+---------------+---------+-----------+---------------+--------------+ CFV      Full           Yes      Yes                                      +---------+---------------+---------+-----------+---------------+--------------+  SFJ      Full                                                             +---------+---------------+---------+-----------+---------------+--------------+ FV Prox  Full                                                             +---------+---------------+---------+-----------+---------------+--------------+ FV Mid   Full                                                             +---------+---------------+---------+-----------+---------------+--------------+ FV Distal                                   patent by color                                                           Doppler                       +---------+---------------+---------+-----------+---------------+--------------+ PFV      Full                                                             +---------+---------------+---------+-----------+---------------+--------------+ POP      Full           Yes      Yes                                      +---------+---------------+---------+-----------+---------------+--------------+ PTV      Full                                                             +---------+---------------+---------+-----------+---------------+--------------+ PERO     Full                                                             +---------+---------------+---------+-----------+---------------+--------------+   +-------+---------------+---------+-----------+----------+--------------+ LEFT   CompressibilityPhasicitySpontaneityPropertiesThrombus Aging  +-------+---------------+---------+-----------+----------+--------------+ CFV    Full  Yes      Yes                                 +-------+---------------+---------+-----------+----------+--------------+ SFJ    Full                                                        +-------+---------------+---------+-----------+----------+--------------+ FV ProxFull                                                        +-------+---------------+---------+-----------+----------+--------------+ FV Mid Full                                                        +-------+---------------+---------+-----------+----------+--------------+ PFV    Full                                                        +-------+---------------+---------+-----------+----------+--------------+ POP    Full           Yes      Yes                                 +-------+---------------+---------+-----------+----------+--------------+ PTV    Full                                                        +-------+---------------+---------+-----------+----------+--------------+   Left Technical Findings: Not visualized segments include distal femoral vein, peroneal veins.   Summary: RIGHT: - There is no evidence of deep vein thrombosis in the lower extremity. However, portions of this examination were limited- see technologist comments above.  - No cystic structure found in the popliteal fossa.  LEFT: - There is no evidence of deep vein thrombosis in the lower extremity. However, portions of this examination were limited- see technologist comments above.  - No cystic structure found in the popliteal fossa.  *See table(s) above for measurements and observations.    Preliminary        Hosie Poisson M.D. [facility] 03/23/2022, 2:33 PM  Available via Epic secure chat 7am-7pm After 7 pm, please refer to night coverage provider listed on amion.

## 2022-03-23 NOTE — Consult Note (Signed)
Referring Provider:  [facility] Primary Care Physician:  Pcp, No Primary Gastroenterologist:  Dr. Michail Sermon  Reason for Consultation: Anemia, rectal bleeding  HPI: [name] is a 61 y.o. male with past medical history of PE currently not on anticoagulation, CVA, history of coronary artery disease status post PCI in 2019, history of CHF presented to the hospital with dizziness, lightheadedness and hypotension.  Was complaining of intermittent rectal bleeding for 10 days and GI is consulted for further evaluation.  He had similar presentation in March 2023 and was diagnosed with encephalopathy and acute kidney injury from possible overdiuresis.  Initial blood work showed hemoglobin of 10.4, normal INR, normal LFTs with acute kidney injury with creatinine of 3.16.  Normal vitamin B12 and folate level.  Normal ferritin.  Iron studies showed borderline low iron saturation at 17 otherwise normal iron and normal TIBC.  Chronically positive fecal occult blood since July 2021.  Patient seen and examined at bedside.  He is complaining of intermittent dark stool as well as bright blood in the stool for last few weeks.  Occasional abdominal discomfort which gets better with Gas-X.  Complaining of diarrhea alternating with constipation.  Patient also had GI bleed in 2020 and underwent EGD and colonoscopy at that time.  Colonoscopy showed two  polyps, diverticulosis and internal hemorrhoids ,no evidence of active bleeding.  EGD showed gastritis.  Also had EGD and colonoscopy in July 2021. Past Medical History:  Diagnosis Date   Anemia    Arthritis    Back pain    CAD (coronary artery disease)    a. s/p DES to LAD in 05/2016   Cervical radiculopathy    Chronic diastolic CHF (congestive heart failure) (HCC)    Chronic pain    Depression    DVT (deep venous thrombosis) (HCC)    Hematemesis    Hepatic steatosis    Hyperlipidemia    Hypertension    IBS (irritable bowel syndrome)    Morbid obesity (HCC)     OSA (obstructive sleep apnea)    Pancreatitis    PE (pulmonary thromboembolism) ([place])    Peripheral neuropathy    PUD (peptic ulcer disease)    Renal disorder    Stroke [facility])    a. ?details unclear - not seen on imaging when he was admitted in 05/2017 for TIA symptoms which were felt due to cervical radiculopathy.   Thoracic aortic ectasia (HCC)    a. 4.3cm ectatic ascending thoracic aorta by CT 06/2017.    Type 2 diabetes mellitus ([place])     Past Surgical History:  Procedure Laterality Date   CARDIAC CATHETERIZATION N/A 05/31/2016   Procedure: Left Heart Cath and Coronary Angiography;  Surgeon: Peter M Martinique, MD;  Location: [place] CV LAB;  Service: Cardiovascular;  Laterality: N/A;   CARDIAC CATHETERIZATION N/A 05/31/2016   Procedure: Intravascular Pressure Wire/FFR Study;  Surgeon: Peter M Martinique, MD;  Location: [place] CV LAB;  Service: Cardiovascular;  Laterality: N/A;   CARDIAC CATHETERIZATION N/A 05/31/2016   Procedure: Coronary Stent Intervention;  Surgeon: Peter M Martinique, MD;  Location: [place] CV LAB;  Service: Cardiovascular;  Laterality: N/A;   COLONOSCOPY N/A 07/17/2021   Procedure: COLONOSCOPY;  Surgeon: Wilford Corner, MD;  Location: [facility] ENDOSCOPY;  Service: Endoscopy;  Laterality: N/A;   COLONOSCOPY WITH PROPOFOL N/A 04/29/2020   Procedure: COLONOSCOPY WITH PROPOFOL;  Surgeon: Wilford Corner, MD;  Location: [place];  Service: Endoscopy;  Laterality: N/A;   ESOPHAGOGASTRODUODENOSCOPY N/A 04/29/2020   Procedure: ESOPHAGOGASTRODUODENOSCOPY (  EGD);  Surgeon: Wilford Corner, MD;  Location: [place];  Service: Endoscopy;  Laterality: N/A;   ESOPHAGOGASTRODUODENOSCOPY (EGD) WITH PROPOFOL N/A 07/17/2021   Procedure: ESOPHAGOGASTRODUODENOSCOPY (EGD) WITH PROPOFOL;  Surgeon: Wilford Corner, MD;  Location: [facility];  Service: Endoscopy;  Laterality: N/A;   LEFT HEART CATH AND CORONARY ANGIOGRAPHY N/A 12/08/2017   Procedure: LEFT HEART CATH AND CORONARY  ANGIOGRAPHY;  Surgeon: Leonie Man, MD;  Location: [place] CV [facility];  Service: Cardiovascular;  Laterality: N/A;   LEFT HEART CATHETERIZATION WITH CORONARY ANGIOGRAM N/A 02/03/2014   Procedure: LEFT HEART CATHETERIZATION WITH CORONARY ANGIOGRAM;  Surgeon: Pixie Casino, MD;  Location: [facility] CATH LAB;  Service: Cardiovascular;  Laterality: N/A;   left leg stent      POLYPECTOMY  04/29/2020   Procedure: POLYPECTOMY;  Surgeon: Wilford Corner, MD;  Location: [facility] ENDOSCOPY;  Service: Endoscopy;;   POLYPECTOMY  07/17/2021   Procedure: POLYPECTOMY;  Surgeon: Wilford Corner, MD;  Location: [facility]OSCOPY;  Service: Endoscopy;;    Prior to Admission medications   Medication Sig Start Date End Date Taking? Authorizing Provider  albuterol (VENTOLIN HFA) 108 (90 Base) MCG/ACT inhaler Inhale 2 puffs into the lungs every 6 (six) hours as needed for wheezing or shortness of breath.   Yes [provider]  atorvastatin (LIPITOR) 40 MG tablet Take 40 mg by mouth at bedtime.   Yes [provider]  carvedilol (COREG) 12.5 MG tablet Take 12.5 mg by mouth 2 (two) times daily. 10/22/20  Yes [provider]  cyanocobalamin 1000 MCG tablet Take by mouth. 03/06/20  Yes [provider]  diclofenac (FLECTOR) 1.3 % PTCH Place 1 patch onto the skin 2 (two) times daily as needed (back and knee pain).   Yes [provider]  fenofibrate (TRICOR) 145 MG tablet Take 1 tablet (145 mg total) by mouth daily. Patient taking differently: Take 145 mg by mouth every morning. 06/25/21  Yes Cleaver, Jossie Ng, NP  Fluticasone Propionate (FLONASE ALLERGY RELIEF NA) Place 1 spray into the nose daily as needed (congestion).   Yes [provider]  furosemide (LASIX) 40 MG tablet Take 1 tablet (40 mg total) by mouth 3 (three) times daily. 10/27/21 10/27/22 Yes Goodrich, Callie E, PA-C  gabapentin (NEURONTIN) 800 MG tablet Take 800 mg by mouth 3 (three) times daily. 09/29/21  Yes [provider]  glipiZIDE (GLUCOTROL) 10 MG tablet Take 10 mg by mouth 2 (two) times daily. 05/30/17  Yes [provider]  HUMULIN R U-500 KWIKPEN 500 UNIT/ML kwikpen Inject 85-110 Units into the skin See admin instructions. Per sliding scale 3 times daily with meals 06/04/20  Yes [provider]  isosorbide mononitrate (IMDUR) 30 MG 24 hr tablet TAKE 1 TABLET BY MOUTH DAILY Patient taking differently: Take 30 mg by mouth daily. 01/03/22  Yes Hilty, Nadean Corwin, MD  lisinopril (ZESTRIL) 20 MG tablet Take 20 mg by mouth daily.   Yes [provider]  Menthol, Topical Analgesic, (BIOFREEZE EX) Apply 1 application topically 2 (two) times daily as needed (knee pain).   Yes [provider]  nitroGLYCERIN (NITROSTAT) 0.4 MG SL tablet Place 1 tablet (0.4 mg total) under the tongue every 5 (five) minutes as needed for chest pain. 02/06/19  Yes Skeet Latch, MD  ondansetron (ZOFRAN) 4 MG tablet Take 4 mg by mouth 2 (two) times daily as needed for nausea/vomiting. 04/21/20  Yes [provider]  Oxycodone HCl 20 MG TABS Take 20 mg by mouth 4 (four) times  daily as needed (pain).   Yes [provider]  pantoprazole (PROTONIX) 40 MG tablet Take 40 mg by mouth every morning. 04/13/21  Yes [provider]  potassium chloride (MICRO-K) 10 MEQ CR capsule Take 20 mEq by mouth 2 (two) times daily. 04/03/20  Yes [provider]  sertraline (ZOLOFT) 50 MG tablet Take 50 mg by mouth every morning. 12/17/20  Yes [provider]  tiZANidine (ZANAFLEX) 4 MG tablet Take 4 mg by mouth at bedtime as needed for muscle spasms. 03/31/20  Yes [provider]  traZODone (DESYREL) 50 MG tablet Take 1 tablet (50 mg total) by mouth at bedtime. 12/15/16  Yes Burns, Arloa Koh, MD  Vitamin D, Ergocalciferol, (DRISDOL) 1.25 MG (50000 UT) CAPS capsule Take 50,000 Units by mouth every Monday. 08/15/19  Yes [provider]  apixaban (ELIQUIS) 5 MG TABS  tablet Take 2 tablets ('10mg'$ ) twice daily for 7 days, then 1 tablet ('5mg'$ ) twice daily Patient not taking: Reported on 02/15/2022 07/04/21   Little Ishikawa, MD  Insulin Pen Needle 31G X 5 MM MISC Use 1 needle daily to inject insulin as prescribed 06/18/17   Barton Dubois, MD    Scheduled Meds:  atorvastatin  40 mg Oral QHS   fenofibrate  54 mg Oral Daily   gabapentin  800 mg Oral TID   insulin aspart  0-9 Units Subcutaneous Q4H   insulin glargine-yfgn  10 Units Subcutaneous QHS   [START ON 03/26/2022] pantoprazole  40 mg Intravenous Q12H   sertraline  50 mg Oral q morning   Continuous Infusions: PRN Meds:.acetaminophen **OR** acetaminophen, albuterol, HYDROcodone-acetaminophen, oxyCODONE  Allergies as of 03/22/2022 - Review Complete 03/22/2022  Allergen Reaction Noted   Coconut flavor [flavoring agent] Hives 01/26/2017   Coconut (cocos nucifera) Hives 03/09/2016   Ibuprofen Other (See Comments) 01/29/2014   Aleve [naproxen] Other (See Comments) 09/02/2017   Nsaids Other (See Comments) 12/27/2017   Other Hives 01/12/2022   Vascepa [icosapent ethyl] Other (See Comments) 09/30/2019    Family History  Problem Relation Age of Onset   Cancer Father    Hypertension Mother    Diabetes Mother    Breast cancer Mother    Hypertension Brother    Diabetes Brother    Hypertension Sister    Diabetes Sister     Social History   Socioeconomic History   Marital status: Single    Spouse name: Not on file   Number of children: 3   Years of education: 12   Highest education level: Not on file  Occupational History   Occupation: Disabled  Tobacco Use   Smoking status: Never   Smokeless tobacco: Never  Vaping Use   Vaping Use: Never used  Substance and Sexual Activity   Alcohol use: No   Drug use: No   Sexual activity: Not on file  Other Topics Concern   Not on file  Social History Narrative   Independent and ambulatory with cane.   Lives at home alone.   Right-handed.   1-2  cups caffeine per day.   Social Determinants of Health   Financial Resource Strain: Not on file  Food Insecurity: Not on file  Transportation Needs: Not on file  Physical Activity: Not on file  Stress: Not on file  Social Connections: Not on file  Intimate Partner Violence: Not on file    Review of Systems: All negative except as stated above in HPI.  Physical Exam: Vital signs: Vitals:   03/22/22 2331 03/23/22  0317  BP: (!) 120/59 94/65  Pulse: 72 65  Resp: 20 16  Temp: 99.1 F (37.3 C) 98.1 F (36.7 C)  SpO2: 93% 98%   Last BM Date : 03/22/22 General:   obese, not in acute distress Lungs: No visible respiratory distress Heart:  Regular rate and rhythm; no murmurs, clicks, rubs,  or gallops. Abdomen: Obese abdomen, soft, nontender, bowel sounds present, no peritoneal signs Rectal:  Deferred  GI:  Lab Results: Recent Labs    03/22/22 1046 03/22/22 1051 03/22/22 2019 03/23/22 0558  WBC 9.4  --   --  8.5  HGB 10.4* 10.9* 11.2* 10.6*  HCT 32.3* 32.0* 33.0* 32.4*  PLT 193  --   --  199   BMET Recent Labs    03/22/22 1046 03/22/22 1051 03/22/22 2019 03/23/22 0558  NA 138 139 142 139  K 4.2 4.4 4.1 4.0  CL 102 104  --  105  CO2 23  --   --  24  GLUCOSE 200* 198*  --  156*  BUN 51* 50*  --  38*  CREATININE 3.16* 3.30*  --  1.87*  CALCIUM 9.2  --   --  8.9   LFT Recent Labs    03/22/22 2000 03/23/22 0558  PROT 7.2 7.0  ALBUMIN 3.7 3.7  AST 18 19  ALT 17 17  ALKPHOS 25* 29*  BILITOT 0.5 0.5  BILIDIR 0.1  --   IBILI 0.4  --    PT/INR Recent Labs    03/22/22 1046  LABPROT 14.3  INR 1.1     Studies/Results: CT HEAD WO CONTRAST  Result Date: 03/22/2022 CLINICAL DATA:  Vision loss EXAM: CT HEAD WITHOUT CONTRAST TECHNIQUE: Contiguous axial images were obtained from the base of the skull through the vertex without intravenous contrast. RADIATION DOSE REDUCTION: This exam was performed according to the departmental dose-optimization program  which includes automated exposure control, adjustment of the mA and/or kV according to patient size and/or use of iterative reconstruction technique. COMPARISON:  Head CT dated January 12, 2022 FINDINGS: Brain: No evidence of acute infarction, hemorrhage, hydrocephalus, extra-axial collection or mass lesion/mass effect. Vascular: No hyperdense vessel or unexpected calcification. Skull: Normal. Negative for fracture or focal lesion. Sinuses/Orbits: No acute finding. Other: None. IMPRESSION: No acute intracranial abnormality. Electronically Signed   By: Yetta Glassman M.D.   On: 03/22/2022 11:07   DG Chest Portable 1 View  Result Date: 03/22/2022 CLINICAL DATA:  Chest pain and hypotension. EXAM: PORTABLE CHEST 1 VIEW COMPARISON:  03/04/2022 FINDINGS: Upper normal heart size. The lungs appear clear. No blunting of the costophrenic angles. Thoracic spondylosis. IMPRESSION: 1.  No active cardiopulmonary disease is radiographically apparent. 2. Thoracic spondylosis. Electronically Signed   By: Van Clines M.D.   On: 03/22/2022 19:13    Impression/Plan: -Intermittent rectal bleeding.  Minimal drop in hemoglobin.  He had EGD and colonoscopy in July 2021 as well as in September 2022 which showed few small polyps, diverticulosis, hemorrhoids and gastritis.  Was on anticoagulation for PE at that time.  Anticoagulation has been discontinued now. -Shortness of breath. -History of coronary artery disease -Acute kidney injury  Recommendations ------------------------- -Recommend supportive care for now. -Anusol suppository at bedtime for hemorrhoids -Protonix 40 mg twice a day for gastritis -Recommend GI bleeding scan if ongoing drop in hemoglobin or acute bleeding episode -Currently undergoing work-up for PE.  If anticoagulation needed, recommend to start with heparin drip and then switch to oral after 24 to 48 hours  if hemoglobin remains stable. -No further inpatient GI work-up planned.  Call us back if  needed.   LOS: 1 day   [name]  MD, FACP 03/23/2022, 8:54 AM  Contact #  [phone]

## 2022-03-23 NOTE — Progress Notes (Signed)
Nutrition Brief Note  RD consulted for assessment.  61 year old male who presented to the ED on 5/30 with stroke-like symptoms. PMH of CAD, HTN, HLD, PE, multiple CVAs.  Spoke with pt at bedside whose only complaint is feeling very hungry. He states that it has been 2 days since he has had any food due to being at the hospital. He denies any issues with appetite or PO intake PTA. He reports typically eating 3 meals daily with multiple snacks between meals. Pt also denies any recent changes in weight. He states that his weight has been stable for years.  Pt currently on a clear liquid diet. Discussed pt's concerns about hunger and wanting to eat with MD who agreed with advancing diet to GI soft for lunch meal today. RD to order. No meal completions documented for clear liquid meals.  Wt Readings from Last 15 Encounters:  03/23/22 (!) 150 kg  03/10/22 (!) 150 kg  03/04/22 (!) 153.7 kg  02/15/22 (!) 153.7 kg  01/28/22 (!) 154.4 kg  01/24/22 (!) 152.4 kg  01/13/22 (!) 149 kg  12/27/21 (!) 156.5 kg  12/21/21 (!) 157.9 kg  11/17/21 (!) 158.1 kg  11/16/21 (!) 153.3 kg  10/29/21 (!) 154.9 kg  10/27/21 (!) 153.4 kg  10/16/21 (!) 154.2 kg  07/30/21 (!) 154.1 kg    No nutrition interventions warranted at this time. If nutrition issues arise, please consult RD.   Gustavus Bryant, MS, RD, LDN Inpatient Clinical Dietitian Please see AMiON for contact information.

## 2022-03-23 NOTE — Progress Notes (Signed)
OT Cancellation Note  Patient Details Name: [name] MRN: [id number] DOB: [birth year]   Cancelled Treatment:    Reason Eval/Treat Not Completed: Patient at procedure or test/ unavailable Patient off floor at CT, OT will complete evaluation once back on the unit.  Corinne Ports E. Christean Silvestri, OTR/L Acute Rehabilitation Services [phone] [place] 03/23/2022, 8:22 AM

## 2022-03-24 DIAGNOSIS — E1122 Type 2 diabetes mellitus with diabetic chronic kidney disease: Secondary | ICD-10-CM | POA: Diagnosis not present

## 2022-03-24 DIAGNOSIS — K219 Gastro-esophageal reflux disease without esophagitis: Secondary | ICD-10-CM | POA: Diagnosis not present

## 2022-03-24 DIAGNOSIS — N179 Acute kidney failure, unspecified: Secondary | ICD-10-CM | POA: Diagnosis not present

## 2022-03-24 DIAGNOSIS — K625 Hemorrhage of anus and rectum: Secondary | ICD-10-CM | POA: Diagnosis not present

## 2022-03-24 LAB — BASIC METABOLIC PANEL
Anion gap: 7 (ref 5–15)
BUN: 23 mg/dL — ABNORMAL HIGH (ref 6–20)
CO2: 26 mmol/L (ref 22–32)
Calcium: 9.9 mg/dL (ref 8.9–10.3)
Chloride: 109 mmol/L (ref 98–111)
Creatinine, Ser: 1.27 mg/dL — ABNORMAL HIGH (ref 0.61–1.24)
GFR, Estimated: >60 mL/min (ref >60)
Glucose, Bld: 216 mg/dL — ABNORMAL HIGH (ref 70–99)
Potassium: 3.9 mmol/L (ref 3.5–5.1)
Sodium: 142 mmol/L (ref 135–145)

## 2022-03-24 LAB — GLUCOSE, CAPILLARY
Glucose-Capillary: 234 mg/dL — ABNORMAL HIGH (ref 70–99)
Glucose-Capillary: 256 mg/dL — ABNORMAL HIGH (ref 70–99)
Glucose-Capillary: 227 mg/dL — ABNORMAL HIGH (ref 70–99)
Glucose-Capillary: 289 mg/dL — ABNORMAL HIGH (ref 70–99)
Glucose-Capillary: 344 mg/dL — ABNORMAL HIGH (ref 70–99)
Glucose-Capillary: 252 mg/dL — ABNORMAL HIGH (ref 70–99)

## 2022-03-24 LAB — HEMOGLOBIN AND HEMATOCRIT, BLOOD
HCT: 30.2 % — ABNORMAL LOW (ref 39.0–52.0)
Hemoglobin: 10.2 g/dL — ABNORMAL LOW (ref 13.0–17.0)

## 2022-03-24 LAB — HEPARIN LEVEL (UNFRACTIONATED)
Heparin Unfractionated: 0.47 IU/mL (ref 0.30–0.70)
Heparin Unfractionated: 0.4 IU/mL (ref 0.30–0.70)

## 2022-03-24 MED ORDER — TRAZODONE HCL 50 MG PO TABS
50.0000 mg | ORAL_TABLET | Freq: Every day | ORAL | Status: DC
  Administered 2022-03-24 – 2022-03-25 (×2): 50 mg via ORAL
  Filled 2022-03-24 (×2): qty 1

## 2022-03-24 MED ORDER — PANTOPRAZOLE SODIUM 40 MG PO TBEC
40.0000 mg | DELAYED_RELEASE_TABLET | Freq: Two times a day (BID) | ORAL | Status: DC
  Administered 2022-03-24 – 2022-03-26 (×5): 40 mg via ORAL
  Filled 2022-03-24 (×5): qty 1

## 2022-03-24 MED ORDER — VITAMIN B-12 1000 MCG PO TABS
500.0000 ug | ORAL_TABLET | Freq: Every day | ORAL | Status: DC
  Administered 2022-03-24 – 2022-03-26 (×3): 500 ug via ORAL
  Filled 2022-03-24 (×3): qty 1

## 2022-03-24 MED ORDER — FUROSEMIDE 20 MG PO TABS
20.0000 mg | ORAL_TABLET | Freq: Every day | ORAL | Status: DC
  Administered 2022-03-24 – 2022-03-26 (×3): 20 mg via ORAL
  Filled 2022-03-24 (×3): qty 1

## 2022-03-24 MED ORDER — DICLOFENAC EPOLAMINE 1.3 % EX PTCH: 1.0000 | MEDICATED_PATCH | Freq: Two times a day (BID) | CUTANEOUS | Status: DC | PRN

## 2022-03-24 MED ORDER — INSULIN GLARGINE-YFGN 100 UNIT/ML ~~LOC~~ SOLN
15.0000 [IU] | Freq: Every day | SUBCUTANEOUS | Status: DC
  Administered 2022-03-24: 15 [IU] via SUBCUTANEOUS
  Filled 2022-03-24 (×2): qty 0.15

## 2022-03-24 MED ORDER — SIMETHICONE 80 MG PO CHEW
80.0000 mg | CHEWABLE_TABLET | Freq: Four times a day (QID) | ORAL | Status: DC | PRN
Start: 2022-03-24 — End: 2022-03-26
  Administered 2022-03-24: 80 mg via ORAL
  Filled 2022-03-24: qty 1

## 2022-03-24 NOTE — Progress Notes (Signed)
Inpatient Diabetes Program Recommendations  AACE/ADA: New Consensus Statement on Inpatient Glycemic Control (2015)  Target Ranges:  Prepandial:   less than 140 mg/dL      Peak postprandial:   less than 180 mg/dL (1-2 hours)      Critically ill patients:  140 - 180 mg/dL   Lab Results  Component Value Date   GLUCAP 227 (H) 03/24/2022   HGBA1C 9.3 (H) 01/12/2022    Review of Glycemic Control  Latest Reference Range & Units 03/23/22 15:55 03/23/22 20:01 03/23/22 23:32 03/24/22 03:42 03/24/22 08:15 03/24/22 11:48  Glucose-Capillary 70 - 99 mg/dL 210 (H) 249 (H) 212 (H) 234 (H) 256 (H) 227 (H)  (H): Data is abnormally high  Diabetes history: DM 2 Outpatient Diabetes medications:  U500 85-110 units tid with meals, Glucotrol 10 mg bid Current orders for Inpatient glycemic control:  Semglee 10 units daily Novolog sensitive q 4 hours  Inpatient Diabetes Program Recommendations:    Semglee 15 units QHS.  Will continue to follow while inpatient.  Thank you, Reche Dixon, MSN, [place] [facility] Coordinator Inpatient Diabetes Program [phone] (team pager from 8a-5p)

## 2022-03-24 NOTE — Evaluation (Signed)
Occupational Therapy Evaluation/Discharge Patient Details Name: [name] MRN: [id number] DOB: [birth year] Today's Date: 03/24/2022   History of Present Illness Patient presenting to [facility] ED on 03/22/2022 with  hypotension, dizziness, lightheadedness, headache, vertigo, feeling unwell and sluggish on 03/22/22. Pt found to have acute PE. PMH: DM, HTN, peripheral neuropathy, OSA, chronic hip and back pain, PE, chf, obesity, CAD, DVT.   Clinical Impression   PTA, pt lives alone in apartment and typically Independent in all ADLs, IADLs and mobility. Pt does require intermittent assist for compression stocking mgmt (from PCA; assists daily for approx 1 hr per pt) and DME use dependent on back/leg pain. Pt presents now at baseline for ADLs and mobilized well per PT eval though endorses fatigue. Educated re: energy conservation strategies for ADLs/IADLs (handout provided), elevation of BLE at home to combat swelling and ease compression stocking mgmt, and continued endurance progression with daily mobility. No skilled OT services needed at acute level or on DC. Pt would benefit from ambulation with mobility specialists during admission.       Recommendations for follow up therapy are one component of a multi-disciplinary discharge planning process, led by the attending physician.  Recommendations may be updated based on patient status, additional functional criteria and insurance authorization.   Follow Up Recommendations  No OT follow up    Assistance Recommended at Discharge PRN  Patient can return home with the following      Functional Status Assessment  Patient has not had a recent decline in their functional status  Equipment Recommendations  Other (comment) (interested in obtaining smaller shower chair that fits with shower setup at home)    Recommendations for Other Services       Precautions / Restrictions Precautions Precautions: None Restrictions Weight Bearing Restrictions: No       Mobility Bed Mobility               General bed mobility comments: pt received up in recliner, does not utilize a bed at home, sleeps in a chair    Transfers                          Balance Overall balance assessment: No apparent balance deficits (not formally assessed)                                         ADL either performed or assessed with clinical judgement   ADL Overall ADL's : At baseline                                       General ADL Comments: denies any concerns with ADLs, able to reach B feet for LB dressing (though endorses baseline deficits wtih compression stockings). noted to mobilize well with PT though fatigued. Discussed energy conservation strategies (handout provided) and obtaining shower chair that works for pt     Vision Baseline Vision/History: 0 No visual deficits Ability to See in Adequate Light: 0 Adequate Patient Visual Report: No change from baseline Vision Assessment?: No apparent visual deficits     Perception     Praxis      Pertinent Vitals/Pain Pain Assessment Pain Assessment: 0-10 Pain Score: 7  Pain Location: back and LEs Pain Descriptors / Indicators: Aching Pain Intervention(s): Monitored during  session, RN gave pain meds during session     Hand Dominance Right   Extremity/Trunk Assessment Upper Extremity Assessment Upper Extremity Assessment: Overall WFL for tasks assessed   Lower Extremity Assessment Lower Extremity Assessment: Defer to PT evaluation   Cervical / Trunk Assessment Cervical / Trunk Assessment: Other exceptions Cervical / Trunk Exceptions: excess body habitus   Communication Communication Communication: No difficulties   Cognition Arousal/Alertness: Awake/alert Behavior During Therapy: WFL for tasks assessed/performed Overall Cognitive Status: Within Functional Limits for tasks assessed                                        General Comments  VSS on RA, HR 110, SpO2 94% upon completion of ambulation    Exercises     Shoulder Instructions      Home Living Family/patient expects to be discharged to:: Private residence Living Arrangements: Alone Available Help at Discharge: Family;Friend(s);Personal care attendant;Available PRN/intermittently Type of Home: Apartment Home Access: Elevator     Home Layout: One level     Bathroom Shower/Tub: Teacher, early years/pre: Standard     Home Equipment: Cane - single point;Rollator (4 wheels);Shower seat (reports shower chair too big for shower)          Prior Functioning/Environment Prior Level of Function : Independent/Modified Independent;Driving             Mobility Comments: PRN use of DME when back and legs are hurting. reports recently working with OP PT when first PE occurred ADLs Comments: Able to complete ADLs, IADLs, typically mobile around grocery store. reports some dificulty with compression stockings and PCA will assist with this prn. PCA comes for approx 1 hour daily during the week though pt reports he does not need this much assist        OT Problem List: Decreased activity tolerance      OT Treatment/Interventions:      OT Goals(Current goals can be found in the care plan section) Acute Rehab OT Goals Patient Stated Goal: increase endurance and strength, return to PLOF OT Goal Formulation: All assessment and education complete, DC therapy  OT Frequency:      Co-evaluation              AM-PAC OT "6 Clicks" Daily Activity     Outcome Measure Help from another person eating meals?: None Help from another person taking care of personal grooming?: None Help from another person toileting, which includes using toliet, bedpan, or urinal?: None Help from another person bathing (including washing, rinsing, drying)?: None Help from another person to put on and taking off regular upper body clothing?: None Help from  another person to put on and taking off regular lower body clothing?: None 6 Click Score: 24   End of Session Nurse Communication: Mobility status  Activity Tolerance: Patient tolerated treatment well Patient left: in chair;with call bell/phone within reach;with nursing/sitter in room  OT Visit Diagnosis: Other abnormalities of gait and mobility (R26.89)                Time: 1245-8099 OT Time Calculation (min): 10 min Charges:  OT General Charges $OT Visit: 1 Visit OT Evaluation $OT Eval Low Complexity: 1 Low  Malachy Chamber, OTR/L Acute Rehab Services Office: [phone]   Layla Maw 03/24/2022, 9:41 AM

## 2022-03-24 NOTE — Progress Notes (Signed)
[facility]                                                                               [name], is a 61 y.o. male, DOB - 12-23-1960, YDX:[id number] Admit date - 03/22/2022    Outpatient Primary MD for the patient is Pcp, No  LOS - 2  days    Brief summary   [name] is a 61 y.o. male with medical history significant of CAD, HTN, HLD, history of PE was on eliquis until April first week, stopped it as per recommendations from hematology,CVA, s/p PCI in 2019, was admitted on 5/30 for  sob, lightheadedness, confusion.  Patient also reports occasional BPR. GI consulted, recommended no further work up at this time as he had EGD and colonoscopy in 06/2021 showing diverticulosis and internal hemorrhoids.   Assessment & Plan    Assessment and Plan:   Sob, lightheadedness probably from acute PE. V/Q scan showed high probability for PE on the left lung.  Two mismatched segmental perfusion defects within the left lung.  Venous duplex of the lower extremities ordered, is negative for DVT.  Echocardiogram ordered to evaluate for right heart strain, showed Left ventricular ejection fraction, by estimation, is 55 to 60%. The left ventricle has normal function. The left ventricle has no regional wall motion abnormalities. Left ventricular diastolic parameters were normal. Right ventricular systolic function is normal. The right ventricular size is normal. There is normal pulmonary artery systolic pressure. Started the patient on IV heparin, will watch for worsening bleeding per rectum for another 24 hours and transition to eliquis on discharge if no bleeding.   Pt  hemoglobin stable around 10 so far.     BRBPR  Possibly from internal hemorrhoids Painless.  GI consulted. recommended no further work up at this time as he had EGD and colonoscopy in 06/2021 showing diverticulosis and internal hemorrhoids.  Supportive care with PPI BID.  Hemoglobin stable around 10.   Anusol cream  Recheck hemoglobin this afternoon.      Hypotension: possibly from over diuresis from lasix.  Resolved with hydration.    Chest pain:  Appears to be intermittent ,not related to activity. Ekg shows NSR.  Troponin is pending.  Echocardiogram reviewed.  Start the patient on IMDUR and NTG.     AKI:  Baseline creatinine is around 1.1. Creatinine on admission was around 3.3, improved to 1.8  to 1.2. Restart lasix at a lower dose.   Type 2 DM:  Uncontrolled with hyperglycemia. Insulin dependent  CBG (last 3)  Recent Labs    03/24/22 0342 03/24/22 0815 03/24/22 1148  GLUCAP 234* 256* 227*   Increase Semglee to 15 units at bedside.  Resume SSI.  Hemoglobin A1c from march 2023 is 9.3%    Hyperlipidemia on statin.    GERD:  Stable.    Normocytic anemia? Mild anemia of blood loss Baseline hemoglobin around 11.  Currently at 18.6. check H&H tonight and tomorrow.     H/o CVA Resume statin.    Acute Metabolic encephalopathy; from AKI.  Resolved. He is alert and oriented.    Hypertension.  BP parameters are better controlled.  OSA  Supposed to use CPAP, pt non compliant. Uses oxygen instead.    Estimated body mass index is 47.45 kg/m as calculated from the following:   Height as of this encounter: '5\' 10"'$  (1.778 m).   Weight as of this encounter: 150 kg.  Code Status:  full code.  DVT Prophylaxis:  SCDs Start: 03/22/22 2005 heparin.    Level of Care: Level of care: Med-Surg Family Communication: none at bedside.   Disposition Plan:     Remains inpatient appropriate:  IV heparin.   Procedures:  NM v/q scan.   Consultants:   GI.   Antimicrobials:   Anti-infectives (From admission, onward)    None        Medications  Scheduled Meds:  atorvastatin  40 mg Oral QHS   fenofibrate  54 mg Oral Daily   furosemide  20 mg Oral Daily   gabapentin  800 mg Oral TID   insulin aspart  0-9 Units Subcutaneous Q4H   insulin  glargine-yfgn  15 Units Subcutaneous QHS   isosorbide mononitrate  30 mg Oral Daily   pantoprazole  40 mg Oral BID AC   sertraline  50 mg Oral q morning   traZODone  50 mg Oral QHS   cyanocobalamin  500 mcg Oral Daily   Continuous Infusions:  heparin 2,000 Units/hr (03/24/22 0921)   PRN Meds:.acetaminophen **OR** acetaminophen, albuterol, diclofenac, HYDROcodone-acetaminophen, nitroGLYCERIN, oxyCODONE, simethicone    Subjective:   [name] was seen and examined today.  No chest pain.  Objective:   Vitals:   03/24/22 0343 03/24/22 0404 03/24/22 0817 03/24/22 1149  BP: (!) 190/90 (!) 149/78 (!) 194/86 130/63  Pulse: 77 72 80 81  Resp: '20  16 15  '$ Temp: 98.2 F (36.8 C)  98.1 F (36.7 C) 97.9 F (36.6 C)  TempSrc: Oral   Oral  SpO2: 97%  97% 96%  Weight:      Height:        Intake/Output Summary (Last 24 hours) at 03/24/2022 1447 Last data filed at 03/24/2022 0818 Gross per 24 hour  Intake 1185.58 ml  Output 2500 ml  Net -1314.42 ml    Filed Weights   03/23/22 1000  Weight: (!) 150 kg     Exam General exam: Appears calm and comfortable  Respiratory system: Clear to auscultation. Respiratory effort normal. Cardiovascular system: S1 & S2 heard, RRR. No JVD, murmurs, rubs, gallops or clicks. No pedal edema. Gastrointestinal system: Abdomen is nondistended, soft and nontender. Normal bowel sounds heard. Central nervous system: Alert and oriented. No focal neurological deficits. Extremities: Symmetric 5 x 5 power. Skin: No rashes, lesions or ulcers Psychiatry: Mood & affect appropriate.    Data Reviewed:  I have personally reviewed following labs and imaging studies   CBC Lab Results  Component Value Date   WBC 8.5 03/23/2022   RBC 3.31 (L) 03/23/2022   HGB 10.4 (L) 03/23/2022   HCT 31.4 (L) 03/23/2022   MCV 95.2 03/23/2022   MCH 30.8 03/23/2022   PLT 209 03/23/2022   MCHC 32.4 03/23/2022   RDW 13.4 03/23/2022   LYMPHSABS 2.0 03/22/2022   MONOABS  0.8 03/22/2022   EOSABS 0.2 03/22/2022   BASOSABS 0.0 71/21/9758     Last metabolic panel Lab Results  Component Value Date   NA 142 03/24/2022   K 3.9 03/24/2022   CL 109 03/24/2022   CO2 26 03/24/2022   BUN 23 (H) 03/24/2022   CREATININE 1.27 (H) 03/24/2022   GLUCOSE 216 (  H) 03/24/2022   GFRNONAA >60 03/24/2022   GFRAA 110 11/02/2020   CALCIUM 9.9 03/24/2022   PHOS 3.4 03/22/2022   PROT 7.0 03/23/2022   ALBUMIN 3.7 03/23/2022   LABGLOB 2.7 10/17/2019   AGRATIO 1.5 10/17/2019   BILITOT 0.5 03/23/2022   ALKPHOS 29 (L) 03/23/2022   AST 19 03/23/2022   ALT 17 03/23/2022   ANIONGAP 7 03/24/2022    CBG (last 3)  Recent Labs    03/24/22 0342 03/24/22 0815 03/24/22 1148  GLUCAP 234* 256* 227*       Coagulation Profile: Recent Labs  Lab 03/22/22 1046  INR 1.1      Radiology Studies: NM Pulmonary Perfusion  Result Date: 03/23/2022 CLINICAL DATA:  Pulmonary embolism (PE) suspected, high prob EXAM: NUCLEAR MEDICINE PERFUSION LUNG SCAN TECHNIQUE: Perfusion images were obtained in multiple projections after intravenous injection of radiopharmaceutical. Ventilation scans intentionally deferred if perfusion scan and chest x-ray adequate for interpretation during COVID 19 epidemic. RADIOPHARMACEUTICALS:  4.3 mCi Tc-49mMAA IV COMPARISON:  Chest x-ray 03/22/2022 FINDINGS: Two mismatched segmental perfusion defects within the left lung. No evidence of a segmental perfusion defect within the right lung. IMPRESSION: High probability for pulmonary embolism in the left lung. These results will be called to the ordering clinician or representative by the Radiologist Assistant, and communication documented in the PACS or [employer] Electronically Signed   By: NDavina PokeD.O.   On: 03/23/2022 09:35   DG Chest Portable 1 View  Result Date: 03/22/2022 CLINICAL DATA:  Chest pain and hypotension. EXAM: PORTABLE CHEST 1 VIEW COMPARISON:  03/04/2022 FINDINGS: Upper normal  heart size. The lungs appear clear. No blunting of the costophrenic angles. Thoracic spondylosis. IMPRESSION: 1.  No active cardiopulmonary disease is radiographically apparent. 2. Thoracic spondylosis. Electronically Signed   By: WVan ClinesM.D.   On: 03/22/2022 19:13   ECHOCARDIOGRAM COMPLETE  Result Date: 03/23/2022    ECHOCARDIOGRAM REPORT   Patient Name:   JSARTHAK RUBENSTEINDate of Exam: 03/23/2022 Medical Rec #:  [id number]       Height:       70.0 in Accession #:    [id number]      Weight:       330.7 lb Date of Birth:  104-13-1962      BSA:          2.585 m Patient Age:    89years         BP:           94/65 mmHg Patient Gender: M                HR:           86 bpm. Exam Location:  Inpatient Procedure: 2D Echo, Cardiac Doppler, Color Doppler and Intracardiac            Opacification Agent Indications:    Pulmonary embolus  History:        Patient has prior history of Echocardiogram examinations. CHF,                 CAD, TIA; Risk Factors:Hypertension, Diabetes, Dyslipidemia and                 Sleep Apnea.  Sonographer:    TJyl HeinzReferring Phys: [id number] Poisson Sonographer Comments: Patient is morbidly obese. Image acquisition challenging due to patient body habitus. IMPRESSIONS  1. Left ventricular ejection fraction, by estimation, is 55 to 60%. The  left ventricle has normal function. The left ventricle has no regional wall motion abnormalities. Left ventricular diastolic parameters were normal.  2. Right ventricular systolic function is normal. The right ventricular size is normal. There is normal pulmonary artery systolic pressure.  3. Left atrial size was mildly dilated.  4. The mitral valve is normal in structure. No evidence of mitral valve regurgitation. No evidence of mitral stenosis.  5. The aortic valve is tricuspid. Aortic valve regurgitation is not visualized. No aortic stenosis is present.  6. Aortic dilatation noted. There is mild dilatation of the ascending aorta,  measuring 37 mm.  7. The inferior vena cava is normal in size with greater than 50% respiratory variability, suggesting right atrial pressure of 3 mmHg. FINDINGS  Left Ventricle: Left ventricular ejection fraction, by estimation, is 55 to 60%. The left ventricle has normal function. The left ventricle has no regional wall motion abnormalities. Definity contrast agent was given IV to delineate the left ventricular  endocardial borders. The left ventricular internal cavity size was normal in size. There is no left ventricular hypertrophy. Left ventricular diastolic parameters were normal. Indeterminate filling pressures. Right Ventricle: The right ventricular size is normal. No increase in right ventricular wall thickness. Right ventricular systolic function is normal. There is normal pulmonary artery systolic pressure. The tricuspid regurgitant velocity is 1.46 m/s, and  with an assumed right atrial pressure of 3 mmHg, the estimated right ventricular systolic pressure is 47.8 mmHg. Left Atrium: Left atrial size was mildly dilated. Right Atrium: Right atrial size was normal in size. Pericardium: There is no evidence of pericardial effusion. Mitral Valve: The mitral valve is normal in structure. No evidence of mitral valve regurgitation. No evidence of mitral valve stenosis. Tricuspid Valve: The tricuspid valve is normal in structure. Tricuspid valve regurgitation is trivial. No evidence of tricuspid stenosis. Aortic Valve: The aortic valve is tricuspid. Aortic valve regurgitation is not visualized. No aortic stenosis is present. Aortic valve peak gradient measures 14.1 mmHg. Pulmonic Valve: The pulmonic valve was normal in structure. Pulmonic valve regurgitation is not visualized. No evidence of pulmonic stenosis. Aorta: Aortic dilatation noted. There is mild dilatation of the ascending aorta, measuring 37 mm. Venous: The inferior vena cava is normal in size with greater than 50% respiratory variability, suggesting  right atrial pressure of 3 mmHg. IAS/Shunts: No atrial level shunt detected by color flow Doppler.  LEFT VENTRICLE PLAX 2D LVIDd:         4.80 cm      Diastology LVIDs:         3.40 cm      LV e' medial:    9.79 cm/s LV PW:         1.00 cm      LV E/e' medial:  10.7 LV IVS:        1.00 cm      LV e' lateral:   12.40 cm/s LVOT diam:     2.00 cm      LV E/e' lateral: 8.5 LV SV:         92 LV SV Index:   36 LVOT Area:     3.14 cm  LV Volumes (MOD) LV vol d, MOD A2C: 206.0 ml LV vol d, MOD A4C: 188.0 ml LV vol s, MOD A2C: 73.4 ml LV vol s, MOD A4C: 63.0 ml LV SV MOD A2C:     132.6 ml LV SV MOD A4C:     188.0 ml LV SV MOD BP:  134.6 ml RIGHT VENTRICLE             IVC RV Basal diam:  4.00 cm     IVC diam: 1.70 cm RV Mid diam:    3.20 cm RV S prime:     20.50 cm/s TAPSE (M-mode): 2.8 cm LEFT ATRIUM             Index        RIGHT ATRIUM           Index LA diam:        3.70 cm 1.43 cm/m   RA Area:     17.10 cm LA Vol (A2C):   59.9 ml 23.18 ml/m  RA Volume:   40.80 ml  15.79 ml/m LA Vol (A4C):   75.5 ml 29.21 ml/m LA Biplane Vol: 68.5 ml 26.50 ml/m  AORTIC VALVE AV Area (Vmax): 2.57 cm AV Vmax:        188.00 cm/s AV Peak Grad:   14.1 mmHg LVOT Vmax:      154.00 cm/s LVOT Vmean:     119.000 cm/s LVOT VTI:       0.293 m  AORTA Ao Root diam: 3.40 cm Ao Asc diam:  3.70 cm MITRAL VALVE                TRICUSPID VALVE MV Area (PHT): 3.83 cm     TR Peak grad:   8.5 mmHg MV Decel Time: 198 msec     TR Vmax:        146.00 cm/s MV E velocity: 105.00 cm/s MV A velocity: 73.90 cm/s   SHUNTS MV E/A ratio:  1.42         Systemic VTI:  0.29 m                             Systemic Diam: 2.00 cm Skeet Latch MD Electronically signed by Skeet Latch MD Signature Date/Time: 03/23/2022/4:40:21 PM    Final    VAS [place] LOWER EXTREMITY VENOUS (DVT)  Result Date: 03/23/2022  Lower Venous DVT Study Patient Name:  TRINIDAD PETRON  Date of Exam:   03/23/2022 Medical Rec #: [id number]         Accession #:    [id number] Date of Birth:  [birth year]        Patient Gender: M Patient Age:   38 years Exam Location:  [facility] Procedure:      VAS [place] LOWER EXTREMITY VENOUS (DVT) Referring Phys: Hosie Poisson --------------------------------------------------------------------------------  Indications: Edema.  Limitations: Poor ultrasound/tissue interface and body habitus. Comparison Study: 06/28/2021 negative lower extremity venous duplex Performing Technologist: Maudry Mayhew MHA, RDMS, RVT, RDCS  Examination Guidelines: A complete evaluation includes B-mode imaging, spectral Doppler, color Doppler, and power Doppler as needed of all accessible portions of each vessel. Bilateral testing is considered an integral part of a complete examination. Limited examinations for reoccurring indications may be performed as noted. The reflux portion of the exam is performed with the patient in reverse Trendelenburg.  +---------+---------------+---------+-----------+---------------+--------------+ RIGHT    CompressibilityPhasicitySpontaneityProperties     Thrombus Aging +---------+---------------+---------+-----------+---------------+--------------+ CFV      Full           Yes      Yes                                      +---------+---------------+---------+-----------+---------------+--------------+  SFJ      Full                                                             +---------+---------------+---------+-----------+---------------+--------------+ FV Prox  Full                                                             +---------+---------------+---------+-----------+---------------+--------------+ FV Mid   Full                                                             +---------+---------------+---------+-----------+---------------+--------------+ FV Distal                                   patent by color                                                           Doppler                        +---------+---------------+---------+-----------+---------------+--------------+ PFV      Full                                                             +---------+---------------+---------+-----------+---------------+--------------+ POP      Full           Yes      Yes                                      +---------+---------------+---------+-----------+---------------+--------------+ PTV      Full                                                             +---------+---------------+---------+-----------+---------------+--------------+ PERO     Full                                                             +---------+---------------+---------+-----------+---------------+--------------+   +-------+---------------+---------+-----------+----------+--------------+ LEFT   CompressibilityPhasicitySpontaneityPropertiesThrombus Aging +-------+---------------+---------+-----------+----------+--------------+ CFV    Full  Yes      Yes                                 +-------+---------------+---------+-----------+----------+--------------+ SFJ    Full                                                        +-------+---------------+---------+-----------+----------+--------------+ FV ProxFull                                                        +-------+---------------+---------+-----------+----------+--------------+ FV Mid Full                                                        +-------+---------------+---------+-----------+----------+--------------+ PFV    Full                                                        +-------+---------------+---------+-----------+----------+--------------+ POP    Full           Yes      Yes                                 +-------+---------------+---------+-----------+----------+--------------+ PTV    Full                                                         +-------+---------------+---------+-----------+----------+--------------+   Left Technical Findings: Not visualized segments include distal femoral vein, peroneal veins.   Summary: RIGHT: - There is no evidence of deep vein thrombosis in the lower extremity. However, portions of this examination were limited- see technologist comments above.  - No cystic structure found in the popliteal fossa.  LEFT: - There is no evidence of deep vein thrombosis in the lower extremity. However, portions of this examination were limited- see technologist comments above.  - No cystic structure found in the popliteal fossa.  *See table(s) above for measurements and observations. Electronically signed by Harold Barban MD on 03/23/2022 at 1:50:22 PM.    Final        Hosie Poisson M.D. [facility] 03/24/2022, 2:47 PM  Available via Epic secure chat 7am-7pm After 7 pm, please refer to night coverage provider listed on amion.

## 2022-03-24 NOTE — Progress Notes (Signed)
ANTICOAGULATION CONSULT NOTE  Pharmacy Consult for heparin Indication: pulmonary embolus  Heparin Dosing Weight: 108.9 kg  Labs: Recent Labs    03/22/22 1046 03/22/22 1051 03/22/22 2000 03/22/22 2019 03/23/22 0558 03/23/22 1009 03/23/22 1821 03/23/22 1907 03/24/22 0609  HGB 10.4* 10.9*  --    < > 10.6* 10.2* 10.4*  --   --   HCT 32.3* 32.0*  --    < > 32.4* 31.5* 31.4*  --   --   PLT 193  --   --   --  199 209  --   --   --   APTT 26  --   --   --   --   --   --   --   --   LABPROT 14.3  --   --   --   --   --   --   --   --   INR 1.1  --   --   --   --   --   --   --   --   HEPARINUNFRC  --   --   --   --   --   --   --  0.12* 0.47  CREATININE 3.16* 3.30*  --   --  1.87*  --   --   --  1.27*  CKTOTAL  --   --  124  --   --   --   --   --   --    < > = values in this interval not displayed.     Assessment: 33 yom with hx GIB, PE currently not on anticoagulation PTA admitted 5/30 with intermittent rectal bleeding and minimal drop in Hg. GI consulted and recommending supportive care for now. V/Q scan this admit resulted as high probability for PE in L lung. GI ok to start heparin drip if needed with plan to switch to PO 24-48 hrs if Hg stable. Pharmacy consulted to dose heparin infusion with no bolus. Hg low but stable 10.6, plt WNL. AKI noted, but SCr now trending down. Previously on SCDs only this admit.  Heparin level came back therapeutic this AM, but towards the top end of the goal range. We will decrease rate slightly and check for level in 8 hours.   Goal of Therapy:  Heparin level 0.3-0.5 units/ml Monitor platelets by anticoagulation protocol: Yes   Plan:  Increase heparin infusion to 2000 units/hr Check heparin level in 8 hours Monitor daily CBC, s/sx bleeding  Alanda Slim, PharmD, [facility] Clinical Pharmacist Please see AMION for all Pharmacists' Contact Phone Numbers 03/24/2022, 8:04 AM

## 2022-03-24 NOTE — Evaluation (Signed)
Physical Therapy Evaluation Patient Details Name: [name] MRN: [id number] DOB: October 16, 1961 Today's Date: 03/24/2022  History of Present Illness  Patient presenting to [facility] ED on 03/22/2022 with  hypotension, dizziness, lightheadedness, headache, vertigo, feeling unwell and sluggish on 03/22/22. Pt found to have acute PE. PMH: DM, HTN, peripheral neuropathy, OSA, chronic hip and back pain, PE, chf, obesity, CAD, DVT.  Clinical Impression  Pt presents to PT with mild deficits in endurance at this time. Pt is able to ambulate for household and limited community distances, performing all mobility required within the home at a modI or independent level. Pt reports feeling increased fatigue during ambulation although sats appear WNL upon completion of mobility. Pt is encouraged to mobilize frequently during this admission in an effort to improve activity tolerance. Pt has no post-acute PT needs.       Recommendations for follow up therapy are one component of a multi-disciplinary discharge planning process, led by the attending physician.  Recommendations may be updated based on patient status, additional functional criteria and insurance authorization.  Follow Up Recommendations No PT follow up    Assistance Recommended at Discharge PRN  Patient can return home with the following       Equipment Recommendations None recommended by PT  Recommendations for Other Services       Functional Status Assessment Patient has had a recent decline in their functional status and demonstrates the ability to make significant improvements in function in a reasonable and predictable amount of time.     Precautions / Restrictions Precautions Precautions: None Restrictions Weight Bearing Restrictions: No      Mobility  Bed Mobility               General bed mobility comments: pt received up in recliner, does not utilize a bed at home, sleeps in a chair    Transfers Overall transfer level:  Modified independent   Transfers: Sit to/from Stand Sit to Stand: Modified independent (Device/Increase time)           General transfer comment: increased time, use of hands    Ambulation/Gait Ambulation/Gait assistance: Independent Gait Distance (Feet): 300 Feet Assistive device: None Gait Pattern/deviations: WFL(Within Functional Limits) Gait velocity: functional Gait velocity interpretation: >2.62 ft/sec, indicative of community ambulatory   General Gait Details: steady step-through gait, no balance deviations noted. Pt reports feeling winded by end of walk  Stairs            Wheelchair Mobility    Modified Rankin (Stroke Patients Only)       Balance Overall balance assessment: Independent                                           Pertinent Vitals/Pain Pain Assessment Pain Assessment: 0-10 Pain Score: 7  Pain Location: back and LEs Pain Descriptors / Indicators: Aching Pain Intervention(s): Monitored during session    Home Living Family/patient expects to be discharged to:: Private residence Living Arrangements: Alone Available Help at Discharge: Family;Friend(s);Available PRN/intermittently Type of Home: Apartment Home Access: Elevator (3rd level apartment)       Home Layout: One level Home Equipment: Cane - single point;Rollator (4 wheels);Shower seat      Prior Function Prior Level of Function : Independent/Modified Independent;Driving             Mobility Comments: PRN use of DME when back and legs are  hurting       Hand Dominance   Dominant Hand: Right    Extremity/Trunk Assessment   Upper Extremity Assessment Upper Extremity Assessment: Overall WFL for tasks assessed    Lower Extremity Assessment Lower Extremity Assessment: Overall WFL for tasks assessed    Cervical / Trunk Assessment Cervical / Trunk Assessment: Other exceptions Cervical / Trunk Exceptions: excess body habitus  Communication    Communication: No difficulties  Cognition Arousal/Alertness: Awake/alert Behavior During Therapy: WFL for tasks assessed/performed Overall Cognitive Status: Within Functional Limits for tasks assessed                                          General Comments General comments (skin integrity, edema, etc.): VSS on RA, HR 110, SpO2 94% upon completion of ambulation    Exercises     Assessment/Plan    PT Assessment Patient needs continued PT services  PT Problem List Decreased activity tolerance;Cardiopulmonary status limiting activity       PT Treatment Interventions Functional mobility training;Therapeutic exercise;Balance training;Patient/family education    PT Goals (Current goals can be found in the Care Plan section)  Acute Rehab PT Goals Patient Stated Goal: to go home PT Goal Formulation: With patient Time For Goal Achievement: 04/07/22 Potential to Achieve Goals: Good Additional Goals Additional Goal #1: Pt will report DOE of 2/10 or less when ambulating for >250' to demonstrate improvement in activity tolerance    Frequency Min 2X/week     Co-evaluation               AM-PAC PT "6 Clicks" Mobility  Outcome Measure Help needed turning from your back to your side while in a flat bed without using bedrails?: None Help needed moving from lying on your back to sitting on the side of a flat bed without using bedrails?: None Help needed moving to and from a bed to a chair (including a wheelchair)?: None Help needed standing up from a chair using your arms (e.g., wheelchair or bedside chair)?: None Help needed to walk in hospital room?: None Help needed climbing 3-5 steps with a railing? : A Little 6 Click Score: 23    End of Session   Activity Tolerance: Patient tolerated treatment well Patient left: in chair;with call bell/phone within reach Nurse Communication: Mobility status PT Visit Diagnosis: Other abnormalities of gait and mobility  (R26.89)    Time: 7078-6754 PT Time Calculation (min) (ACUTE ONLY): 14 min   Charges:   PT Evaluation $PT Eval Low Complexity: [place], PT, DPT Acute Rehabilitation Pager: [phone] Office [phone]   Zenaida Niece 03/24/2022, 8:52 AM

## 2022-03-24 NOTE — Plan of Care (Signed)

## 2022-03-24 NOTE — Progress Notes (Signed)
ANTICOAGULATION CONSULT NOTE  Pharmacy Consult for heparin Indication: pulmonary embolus  Heparin Dosing Weight: 108.9 kg  Labs: Recent Labs    03/22/22 1046 03/22/22 1051 03/22/22 2000 03/22/22 2019 03/23/22 0558 03/23/22 1009 03/23/22 1821 03/23/22 1907 03/24/22 0609 03/24/22 1633  HGB 10.4* 10.9*  --    < > 10.6* 10.2* 10.4*  --   --  10.2*  HCT 32.3* 32.0*  --    < > 32.4* 31.5* 31.4*  --   --  30.2*  PLT 193  --   --   --  199 209  --   --   --   --   APTT 26  --   --   --   --   --   --   --   --   --   LABPROT 14.3  --   --   --   --   --   --   --   --   --   INR 1.1  --   --   --   --   --   --   --   --   --   HEPARINUNFRC  --   --   --   --   --   --   --  0.12* 0.47 0.40  CREATININE 3.16* 3.30*  --   --  1.87*  --   --   --  1.27*  --   CKTOTAL  --   --  124  --   --   --   --   --   --   --    < > = values in this interval not displayed.     Assessment: 25 yom with hx GIB, PE currently not on anticoagulation PTA admitted 5/30 with intermittent rectal bleeding and minimal drop in Hg. GI consulted and recommending supportive care for now. V/Q scan this admit resulted as high probability for PE in L lung. GI ok to start heparin drip if needed with plan to switch to PO 24-48 hrs if Hg stable. Pharmacy consulted to dose heparin infusion with no bolus. LE dopplers negative for DVT.  Heparin level remains therapeutic at 0.4. CBC stable. AKI improving. No bleed issues documented.  Goal of Therapy:  Heparin level 0.3-0.5 units/ml Monitor platelets by anticoagulation protocol: Yes   Plan:  Continue heparin infusion at 2000 units/hr Monitor daily heparin level/CBC, s/sx bleeding   Arturo Morton, PharmD, BCPS Please check AMION for all [place] contact numbers Clinical Pharmacist 03/24/2022 5:11 PM

## 2022-03-25 ENCOUNTER — Inpatient Hospital Stay (HOSPITAL_COMMUNITY): Payer: Medicaid Other

## 2022-03-25 LAB — HEPARIN LEVEL (UNFRACTIONATED): Heparin Unfractionated: 0.38 IU/mL (ref 0.30–0.70)

## 2022-03-25 LAB — CBC
HCT: 31.4 % — ABNORMAL LOW (ref 39.0–52.0)
Hemoglobin: 10.3 g/dL — ABNORMAL LOW (ref 13.0–17.0)
MCH: 30.6 pg (ref 26.0–34.0)
MCHC: 32.8 g/dL (ref 30.0–36.0)
MCV: 93.2 fL (ref 80.0–100.0)
Platelets: 208 10*3/uL (ref 150–400)
RBC: 3.37 MIL/uL — ABNORMAL LOW (ref 4.22–5.81)
RDW: 13.1 % (ref 11.5–15.5)
WBC: 8.2 10*3/uL (ref 4.0–10.5)
nRBC: 0 % (ref 0.0–0.2)

## 2022-03-25 LAB — BASIC METABOLIC PANEL
Anion gap: 10 (ref 5–15)
BUN: 17 mg/dL (ref 6–20)
CO2: 24 mmol/L (ref 22–32)
Calcium: 9.7 mg/dL (ref 8.9–10.3)
Chloride: 106 mmol/L (ref 98–111)
Creatinine, Ser: 1.24 mg/dL (ref 0.61–1.24)
GFR, Estimated: >60 mL/min (ref >60)
Glucose, Bld: 224 mg/dL — ABNORMAL HIGH (ref 70–99)
Potassium: 3.8 mmol/L (ref 3.5–5.1)
Sodium: 140 mmol/L (ref 135–145)

## 2022-03-25 LAB — GLUCOSE, CAPILLARY
Glucose-Capillary: 221 mg/dL — ABNORMAL HIGH (ref 70–99)
Glucose-Capillary: 220 mg/dL — ABNORMAL HIGH (ref 70–99)
Glucose-Capillary: 264 mg/dL — ABNORMAL HIGH (ref 70–99)
Glucose-Capillary: 313 mg/dL — ABNORMAL HIGH (ref 70–99)
Glucose-Capillary: 318 mg/dL — ABNORMAL HIGH (ref 70–99)
Glucose-Capillary: 322 mg/dL — ABNORMAL HIGH (ref 70–99)

## 2022-03-25 MED ORDER — IOHEXOL 350 MG/ML SOLN
80.0000 mL | Freq: Once | INTRAVENOUS | Status: AC | PRN
  Administered 2022-03-25: 80 mL via INTRAVENOUS

## 2022-03-25 MED ORDER — INSULIN GLARGINE-YFGN 100 UNIT/ML ~~LOC~~ SOLN
30.0000 [IU] | Freq: Every day | SUBCUTANEOUS | Status: DC
  Administered 2022-03-25: 30 [IU] via SUBCUTANEOUS
  Filled 2022-03-25 (×2): qty 0.3

## 2022-03-25 MED ORDER — APIXABAN 5 MG PO TABS
10.0000 mg | ORAL_TABLET | Freq: Two times a day (BID) | ORAL | Status: DC
  Administered 2022-03-25 – 2022-03-26 (×3): 10 mg via ORAL
  Filled 2022-03-25 (×3): qty 2

## 2022-03-25 MED ORDER — APIXABAN 5 MG PO TABS: 5.0000 mg | ORAL_TABLET | Freq: Two times a day (BID) | ORAL | Status: DC

## 2022-03-25 MED ORDER — INSULIN GLARGINE-YFGN 100 UNIT/ML ~~LOC~~ SOLN
18.0000 [IU] | Freq: Every day | SUBCUTANEOUS | Status: DC
  Filled 2022-03-25: qty 0.18

## 2022-03-25 MED ORDER — CARVEDILOL 6.25 MG PO TABS
6.2500 mg | ORAL_TABLET | Freq: Two times a day (BID) | ORAL | Status: DC
  Administered 2022-03-25 – 2022-03-26 (×2): 6.25 mg via ORAL
  Filled 2022-03-25 (×2): qty 1

## 2022-03-25 MED ORDER — INSULIN ASPART 100 UNIT/ML IJ SOLN
5.0000 [IU] | Freq: Three times a day (TID) | INTRAMUSCULAR | Status: DC
  Administered 2022-03-25 – 2022-03-26 (×3): 5 [IU] via SUBCUTANEOUS

## 2022-03-25 NOTE — Discharge Instructions (Signed)
Information on my medicine - ELIQUIS (apixaban)  Why was Eliquis prescribed for you? Eliquis was prescribed to treat blood clots that may have been found in the veins of your legs (deep vein thrombosis) or in your lungs (pulmonary embolism) and to reduce the risk of them occurring again.  What do You need to know about Eliquis ? The starting dose is 10 mg (two 5 mg tablets) taken TWICE daily for the FIRST FIVE (5) DAYS, then on (enter date)  03/30/22  the dose is reduced to ONE 5 mg tablet taken TWICE daily.  Eliquis may be taken with or without food.   Try to take the dose about the same time in the morning and in the evening. If you have difficulty swallowing the tablet whole please discuss with your pharmacist how to take the medication safely.  Take Eliquis exactly as prescribed and DO NOT stop taking Eliquis without talking to the doctor who prescribed the medication.  Stopping may increase your risk of developing a new blood clot.  Refill your prescription before you run out.  After discharge, you should have regular check-up appointments with your healthcare provider that is prescribing your Eliquis.    What do you do if you miss a dose? If a dose of ELIQUIS is not taken at the scheduled time, take it as soon as possible on the same day and twice-daily administration should be resumed. The dose should not be doubled to make up for a missed dose.  Important Safety Information A possible side effect of Eliquis is bleeding. You should call your healthcare provider right away if you experience any of the following: Bleeding from an injury or your nose that does not stop. Unusual colored urine (red or dark brown) or unusual colored stools (red or black). Unusual bruising for unknown reasons. A serious fall or if you hit your head (even if there is no bleeding).  Some medicines may interact with Eliquis and might increase your risk of bleeding or clotting while on Eliquis. To help  avoid this, consult your healthcare provider or pharmacist prior to using any new prescription or non-prescription medications, including herbals, vitamins, non-steroidal anti-inflammatory drugs (NSAIDs) and supplements.  This website has more information on Eliquis (apixaban): http://[email or web address].com/eliquis/home

## 2022-03-25 NOTE — Progress Notes (Addendum)
ANTICOAGULATION CONSULT NOTE  Pharmacy Consult for heparin - > apixaban Indication: pulmonary embolus  Heparin Dosing Weight: 108.9 kg  Labs: Recent Labs    03/22/22 1046 03/22/22 1051 03/22/22 2000 03/22/22 2019 03/23/22 0558 03/23/22 1009 03/23/22 1821 03/23/22 1907 03/24/22 0609 03/24/22 1633 03/25/22 0124  HGB 10.4* 10.9*  --    < > 10.6* 10.2* 10.4*  --   --  10.2* 10.3*  HCT 32.3* 32.0*  --    < > 32.4* 31.5* 31.4*  --   --  30.2* 31.4*  PLT 193  --   --   --  199 209  --   --   --   --  208  APTT 26  --   --   --   --   --   --   --   --   --   --   LABPROT 14.3  --   --   --   --   --   --   --   --   --   --   INR 1.1  --   --   --   --   --   --   --   --   --   --   HEPARINUNFRC  --   --   --   --   --   --   --    < > 0.47 0.40 0.38  CREATININE 3.16* 3.30*  --   --  1.87*  --   --   --  1.27*  --   --   CKTOTAL  --   --  124  --   --   --   --   --   --   --   --    < > = values in this interval not displayed.     Assessment: 9 yom with hx GIB, PE currently not on anticoagulation PTA admitted 5/30 with intermittent rectal bleeding and minimal drop in Hg. GI consulted and recommending supportive care for now. V/Q scan this admit resulted as high probability for PE in L lung. GI ok to start heparin drip if needed with plan to switch to PO 24-48 hrs if Hg stable. Pharmacy consulted to dose heparin infusion with no bolus. LE dopplers negative for DVT.  Heparin level remains therapeutic at 0.38. CBC stable. AKI improving. No bleed issues documented.  Goal of Therapy:  Heparin level 0.3-0.5 units/ml Monitor platelets by anticoagulation protocol: Yes   Plan:  Continue heparin infusion at 2000 units/hr Monitor daily heparin level/CBC, s/sx bleeding  ADDENDUM -  -Switch to Apixaban 10 mg po bid x 5 days (was therapeutic for 2 days on heparin); followed by apixaban 5 mg po bid -Stop heparin drip with first dose of apixaban   Alanda Slim, PharmD, [facility] Clinical  Pharmacist Please see AMION for all Pharmacists' Contact Phone Numbers 03/25/2022, 7:37 AM

## 2022-03-25 NOTE — TOC Progression Note (Signed)
Transition of Care [facility]) - Progression Note    Patient Details  Name: [name] MRN: [id number] Date of Birth: July 08, 1961  Transition of Care [facility]) CM/SW [place], RN Phone Number:[phone]  03/25/2022, 11:49 AM  Clinical Narrative:    TOC following patient admitted with lightlessness, bright redblood and some maroon blood per rectum, blurry vision confusion decreasing in speech and hearing. GI consulted.  Transition of Care Department [facility]) has reviewed patient and no TOC needs have been identified at this time. We will continue to monitor patient advancement through interdisciplinary progression rounds.        Expected Discharge Plan and Services                                                 Social Determinants of Health (SDOH) Interventions    Readmission Risk Interventions    06/29/2021   11:43 AM  Readmission Risk Prevention Plan  Transportation Screening Complete  Medication Review ([place]) Complete  PCP or Specialist appointment within 3-5 days of discharge Complete  HRI or [place] Complete  SW Recovery Care/Counseling Consult Complete  [place] Not Applicable

## 2022-03-25 NOTE — Progress Notes (Signed)
Inpatient Diabetes Program Recommendations  AACE/ADA: New Consensus Statement on Inpatient Glycemic Control (2015)  Target Ranges:  Prepandial:   less than 140 mg/dL      Peak postprandial:   less than 180 mg/dL (1-2 hours)      Critically ill patients:  140 - 180 mg/dL   Lab Results  Component Value Date   GLUCAP 220 (H) 03/25/2022   HGBA1C 9.3 (H) 01/12/2022    Review of Glycemic Control  Latest Reference Range & Units 03/24/22 23:08 03/25/22 04:13 03/25/22 07:52  Glucose-Capillary 70 - 99 mg/dL 252 (H) 221 (H) 220 (H)  (H): Data is abnormally high Diabetes history: DM 2 Outpatient Diabetes medications:  U500 85-110 units tid with meals, Glucotrol 10 mg bid Current orders for Inpatient glycemic control:  Semglee 18 units daily Novolog sensitive q 4 hours   Inpatient Diabetes Program Recommendations:    Note patient was on U500 insulin at home.  Recommend increasing Semglee to 30 units daily and add Novolog meal coverage 6 units tid with meals.    Thanks,  Adah Perl, RN, BC-ADM Inpatient Diabetes Coordinator Pager [phone]  (8a-5p)

## 2022-03-25 NOTE — Progress Notes (Signed)
[facility]                                                                               [name], is a 61 y.o. male, DOB - [birth year], AUQ:[id number] Admit date - 03/22/2022    Outpatient Primary MD for the patient is Pcp, No  LOS - 3  days    Brief summary   [name] is a 60 y.o. male with medical history significant of CAD, HTN, HLD, history of PE was on eliquis until April first week, stopped it as per recommendations from hematology,CVA, s/p PCI in 2019, was admitted on 5/30 for  sob, lightheadedness, confusion.  Patient also reports occasional BPR. GI consulted, recommended no further work up at this time as he had EGD and colonoscopy in 06/2021 showing diverticulosis and internal hemorrhoids.   Assessment & Plan    Assessment and Plan:   Sob, lightheadedness probably from acute PE. V/Q scan showed high probability for PE on the left lung.  Two mismatched segmental perfusion defects within the left lung.  Venous duplex of the lower extremities ordered, is negative for DVT.  Echocardiogram ordered to evaluate for right heart strain, showed Left ventricular ejection fraction, by estimation, is 55 to 60%. The left ventricle has normal function. The left ventricle has no regional wall motion abnormalities. Left ventricular diastolic parameters were normal. Right ventricular systolic function is normal. The right ventricular size is normal. There is normal pulmonary artery systolic pressure. CT angio of the chest confirmed the pulmonary embolism.  Started the patient on IV heparin,  transitioned to oral eliquis.  Pt  hemoglobin stable around 10 so far.     BRBPR  Possibly from internal hemorrhoids Painless.  GI consulted. recommended no further work up at this time as he had EGD and colonoscopy in 06/2021 showing diverticulosis and internal hemorrhoids.  Supportive care with PPI BID.  Hemoglobin stable around 10.  Anusol cream    Hypotension:  possibly from over diuresis from lasix.  Resolved with hydration.    Chest pain:  Appears to be intermittent ,not related to activity. Ekg shows NSR.  Troponin is pending.  Echocardiogram reviewed.  Start the patient on IMDUR and NTG.     AKI:  Baseline creatinine is around 1.1. Creatinine on admission was around 3.3, improved to 1.8  to 1.2. Restart lasix at a lower dose.   Type 2 DM:  Uncontrolled with hyperglycemia. Insulin dependent  CBG (last 3)  Recent Labs    03/25/22 0752 03/25/22 1123 03/25/22 1535  GLUCAP 220* 264* 313*   Increase Semglee to 30 units daily.  Resume SSI.  Add novolog 3 units tidac.  Hemoglobin A1c from march 2023 is 9.3%    Hyperlipidemia on statin.    GERD:  Stable.    Normocytic anemia? Mild anemia of blood loss Baseline hemoglobin around 11.  Currently stable around 10.     H/o CVA Resume statin.    Acute Metabolic encephalopathy; from AKI.  Resolved. He is alert and oriented.    Hypertension.  BP parameters are optimal.    OSA  Supposed to use CPAP, pt non compliant. Uses oxygen instead.  Estimated body mass index is 47.45 kg/m as calculated from the following:   Height as of this encounter: '5\' 10"'$  (1.778 m).   Weight as of this encounter: 150 kg.  Code Status:  full code.  DVT Prophylaxis:  SCDs Start: 03/22/22 2005 heparin.  apixaban (ELIQUIS) tablet 10 mg  apixaban (ELIQUIS) tablet 5 mg   Level of Care: Level of care: Med-Surg Family Communication: none at bedside.   Disposition Plan:     Remains inpatient appropriate:  IV heparin.   Procedures:  NM v/q scan.   Consultants:   GI.   Antimicrobials:   Anti-infectives (From admission, onward)    None        Medications  Scheduled Meds:  apixaban  10 mg Oral BID   Followed by   Derrill Memo ON 03/30/2022] apixaban  5 mg Oral BID   atorvastatin  40 mg Oral QHS   carvedilol  6.25 mg Oral BID WC   fenofibrate  54 mg Oral Daily   furosemide  20 mg  Oral Daily   gabapentin  800 mg Oral TID   insulin aspart  0-9 Units Subcutaneous Q4H   insulin aspart  5 Units Subcutaneous TID WC   insulin glargine-yfgn  30 Units Subcutaneous QHS   isosorbide mononitrate  30 mg Oral Daily   pantoprazole  40 mg Oral BID AC   sertraline  50 mg Oral q morning   traZODone  50 mg Oral QHS   vitamin B-12  500 mcg Oral Daily   Continuous Infusions:   PRN Meds:.acetaminophen **OR** acetaminophen, albuterol, diclofenac, HYDROcodone-acetaminophen, nitroGLYCERIN, oxyCODONE, simethicone    Subjective:   [name] was seen and examined today.  Chest pain resolved.  Objective:   Vitals:   03/25/22 0420 03/25/22 0755 03/25/22 1144 03/25/22 1538  BP: 140/61 (!) 159/66 (!) 150/81 (!) 154/71  Pulse: 73 78 90 80  Resp: 18 18    Temp: 98.3 F (36.8 C) 98.3 F (36.8 C) 98.3 F (36.8 C) 98.4 F (36.9 C)  TempSrc: Oral Oral Oral Oral  SpO2: 98% 98% 95% 95%  Weight:      Height:        Intake/Output Summary (Last 24 hours) at 03/25/2022 1744 Last data filed at 03/25/2022 1144 Gross per 24 hour  Intake 240 ml  Output 1350 ml  Net -1110 ml    Filed Weights   03/23/22 1000  Weight: (!) 150 kg     Exam General exam: Appears calm and comfortable  Respiratory system: Clear to auscultation. Respiratory effort normal. Cardiovascular system: S1 & S2 heard, RRR. No JVD, murmurs, rubs, gallops or clicks.  Gastrointestinal system: Abdomen is nondistended, soft and nontender. No organomegaly or masses felt. Normal bowel sounds heard. Central nervous system: Alert and oriented. No focal neurological deficits. Extremities: Symmetric 5 x 5 power. Skin: No rashes, lesions or ulcers Psychiatry: Judgement and insight appear normal. Mood & affect appropriate.     Data Reviewed:  I have personally reviewed following labs and imaging studies   CBC Lab Results  Component Value Date   WBC 8.2 03/25/2022   RBC 3.37 (L) 03/25/2022   HGB 10.3 (L)  03/25/2022   HCT 31.4 (L) 03/25/2022   MCV 93.2 03/25/2022   MCH 30.6 03/25/2022   PLT 208 03/25/2022   MCHC 32.8 03/25/2022   RDW 13.1 03/25/2022   LYMPHSABS 2.0 03/22/2022   MONOABS 0.8 03/22/2022   EOSABS 0.2 03/22/2022   BASOSABS 0.0 03/22/2022  Last metabolic panel Lab Results  Component Value Date   NA 140 03/25/2022   K 3.8 03/25/2022   CL 106 03/25/2022   CO2 24 03/25/2022   BUN 17 03/25/2022   CREATININE 1.24 03/25/2022   GLUCOSE 224 (H) 03/25/2022   GFRNONAA >60 03/25/2022   GFRAA 110 11/02/2020   CALCIUM 9.7 03/25/2022   PHOS 3.4 03/22/2022   PROT 7.0 03/23/2022   ALBUMIN 3.7 03/23/2022   LABGLOB 2.7 10/17/2019   AGRATIO 1.5 10/17/2019   BILITOT 0.5 03/23/2022   ALKPHOS 29 (L) 03/23/2022   AST 19 03/23/2022   ALT 17 03/23/2022   ANIONGAP 10 03/25/2022    CBG (last 3)  Recent Labs    03/25/22 0752 03/25/22 1123 03/25/22 1535  GLUCAP 220* 264* 313*       Coagulation Profile: Recent Labs  Lab 03/22/22 1046  INR 1.1      Radiology Studies: CT Angio Chest Pulmonary Embolism (PE) W or WO Contrast  Addendum Date: 03/25/2022   ADDENDUM REPORT: 03/25/2022 11:13 ADDENDUM: Critical Value/emergent results were called by telephone at the time of interpretation on 03/25/2022 at 11:13 am to provider Hosie Poisson , who verbally acknowledged these results. Electronically Signed   By: Ilona Sorrel M.D.   On: 03/25/2022 11:13   Result Date: 03/25/2022 CLINICAL DATA:  Inpatient. Dyspnea. Pulmonary embolism (PE) suspected, high prob EXAM: CT ANGIOGRAPHY CHEST WITH CONTRAST TECHNIQUE: Multidetector CT imaging of the chest was performed using the standard protocol during bolus administration of intravenous contrast. Multiplanar CT image reconstructions and MIPs were obtained to evaluate the vascular anatomy. RADIATION DOSE REDUCTION: This exam was performed according to the departmental dose-optimization program which includes automated exposure control, adjustment  of the mA and/or kV according to patient size and/or use of iterative reconstruction technique. CONTRAST:  70m OMNIPAQUE IOHEXOL 350 MG/ML SOLN COMPARISON:  03/23/2022 perfusion lung scan. 03/22/2022 chest radiograph. 10/08/2021 chest CT angiogram. FINDINGS: Cardiovascular: The study is moderate to high quality for the evaluation of pulmonary embolism, with mild motion degradation and mildly suboptimal bolus opacification. There is a tiny filling defect within a subsegmental left upper lobe pulmonary artery branch (series 7/image 171 and series 8/image 80), which correlates with the perfusion defect seen on perfusion lung scan from 2 days prior, compatible with a subsegmental pulmonary embolus. No additional pulmonary artery filling defects. Mildly atherosclerotic thoracic aorta with dilated 4.3 cm ascending thoracic aorta. Prominently dilated main pulmonary artery (4.5 cm diameter), unchanged. Top-normal heart size. No significant pericardial fluid/thickening. Left anterior descending and right coronary atherosclerosis. Mediastinum/Nodes: No discrete thyroid nodules. Unremarkable esophagus. No pathologically enlarged axillary, mediastinal or hilar lymph nodes. Lungs/Pleura: No pneumothorax. No pleural effusion. Tiny calcified granuloma at the right lung base is unchanged. No acute consolidative airspace disease, lung masses or significant pulmonary nodules. Minimal dependent bibasilar linear scarring versus atelectasis, unchanged from prior CT. Upper abdomen: Diffuse hepatic steatosis. Musculoskeletal: No aggressive appearing focal osseous lesions. Moderate thoracic spondylosis. Symmetric mild bilateral gynecomastia is unchanged. Review of the MIP images confirms the above findings. IMPRESSION: 1. Solitary acute subsegmental left upper lobe pulmonary embolus, which correlates with the perfusion defect seen on perfusion lung scan from 2 days prior. No additional sites of pulmonary embolism. 2. Prominently dilated  main pulmonary artery, unchanged, suggesting chronic pulmonary arterial hypertension. 3. Two-vessel coronary atherosclerosis. 4. Diffuse hepatic steatosis. 5. Aortic Atherosclerosis (ICD10-I70.0). Electronically Signed: By: JIlona SorrelM.D. On: 03/25/2022 11:06       VHosie PoissonM.D. [facility] 03/25/2022, 5:44  PM  Available via Epic secure chat 7am-7pm After 7 pm, please refer to night coverage provider listed on amion.

## 2022-03-25 NOTE — Progress Notes (Signed)
Mobility Specialist Criteria Algorithm Info.   03/25/22 1135  Mobility  Activity Ambulated with assistance in hallway (in chair before and after ambulation)  Range of Motion/Exercises Active;All extremities  Level of Assistance Independent  Assistive Device None  Distance Ambulated (ft) 500 ft  Activity Response Tolerated well   Patient received in recliner chair agreeable to participate in mobility. Ambulated in hallway independently with steady gait. Returned to room without complaint or incident. Was left in recliner with all needs met, call bell in reach.   03/25/2022 2:31 PM  [name], [place], [place]  GUYQI:[phone] Office: [phone]

## 2022-03-26 LAB — CBC
HCT: 29.7 % — ABNORMAL LOW (ref 39.0–52.0)
Hemoglobin: 10.1 g/dL — ABNORMAL LOW (ref 13.0–17.0)
MCH: 31.2 pg (ref 26.0–34.0)
MCHC: 34 g/dL (ref 30.0–36.0)
MCV: 91.7 fL (ref 80.0–100.0)
Platelets: 212 10*3/uL (ref 150–400)
RBC: 3.24 MIL/uL — ABNORMAL LOW (ref 4.22–5.81)
RDW: 13.1 % (ref 11.5–15.5)
WBC: 8.1 10*3/uL (ref 4.0–10.5)
nRBC: 0 % (ref 0.0–0.2)

## 2022-03-26 LAB — GLUCOSE, CAPILLARY
Glucose-Capillary: 230 mg/dL — ABNORMAL HIGH (ref 70–99)
Glucose-Capillary: 225 mg/dL — ABNORMAL HIGH (ref 70–99)

## 2022-03-26 LAB — BASIC METABOLIC PANEL
Anion gap: 10 (ref 5–15)
BUN: 17 mg/dL (ref 6–20)
CO2: 26 mmol/L (ref 22–32)
Calcium: 9.7 mg/dL (ref 8.9–10.3)
Chloride: 103 mmol/L (ref 98–111)
Creatinine, Ser: 1.19 mg/dL (ref 0.61–1.24)
GFR, Estimated: >60 mL/min (ref >60)
Glucose, Bld: 236 mg/dL — ABNORMAL HIGH (ref 70–99)
Potassium: 3.8 mmol/L (ref 3.5–5.1)
Sodium: 139 mmol/L (ref 135–145)

## 2022-03-26 MED ORDER — APIXABAN 5 MG PO TABS: 5.0000 mg | ORAL_TABLET | Freq: Two times a day (BID) | ORAL | 2 refills | Status: DC

## 2022-03-26 MED ORDER — FUROSEMIDE 20 MG PO TABS: 40.0000 mg | ORAL_TABLET | Freq: Every day | ORAL | 0 refills | Status: DC

## 2022-03-26 MED ORDER — CARVEDILOL 6.25 MG PO TABS: 6.2500 mg | ORAL_TABLET | Freq: Two times a day (BID) | ORAL | 2 refills | Status: DC

## 2022-03-26 MED ORDER — APIXABAN 5 MG PO TABS: 10.0000 mg | ORAL_TABLET | Freq: Two times a day (BID) | ORAL | 0 refills | Status: DC

## 2022-03-26 NOTE — Progress Notes (Signed)
Inpatient Diabetes Program Recommendations  AACE/ADA: New Consensus Statement on Inpatient Glycemic Control (2015)  Target Ranges:  Prepandial:   less than 140 mg/dL      Peak postprandial:   less than 180 mg/dL (1-2 hours)      Critically ill patients:  140 - 180 mg/dL   Lab Results  Component Value Date   GLUCAP 225 (H) 03/26/2022   HGBA1C 9.3 (H) 01/12/2022    Review of Glycemic Control  Diabetes history: DM 2 Outpatient Diabetes medications:  U500 85-110 units tid with meals, Glucotrol 10 mg bid Current orders for Inpatient glycemic control:  Semglee 18 units daily Novolog sensitive q 4 hours   Inpatient Diabetes Program Recommendations:     Note patient was on U500 insulin at home.  Recommend increasing Semglee to 35 units daily and increase Novolog meal coverage 6 units tid with meals.    Thank you, Nani Gasser. Hanalei Glace, RN, MSN, CDE  Diabetes Coordinator Inpatient Glycemic Control Team Team Pager [phone] (8am-5pm) 03/26/2022 8:57 AM

## 2022-03-28 NOTE — Discharge Summary (Signed)
Physician Discharge Summary   Patient: [name] MRN: [id number] DOB: 09-Aug-1961  Admit date:     03/22/2022  Discharge date: 03/26/2022  Discharge Physician: Hosie Poisson   PCP: Pcp, No   Recommendations at discharge:  Please follow up with PCP in one week.  Please check cbc in one week.  We have decreased the dose of lasix to 40 mg daily and deceased the dose of coreg to 6.25 mg BID and stopped lisinopril.  Please follow up with cardiology and recommend restarting lisinopril slowly.   Discharge Diagnoses: Active Problems:   Blood per rectum   Dyslipidemia   HTN (hypertension)   DM2 (diabetes mellitus, type 2) (HCC)   Obesity, Class III, BMI 40-49.9 (morbid obesity) ([facility])   Coronary artery disease involving native coronary artery of native heart without angina pectoris   Normocytic normochromic anemia   History of CVA (cerebrovascular accident)   GERD (gastroesophageal reflux disease)   OSA (obstructive sleep apnea)   Acute kidney injury ([place])   Acute metabolic encephalopathy    Hospital Course:  [name] is a 61 y.o. male with medical history significant of CAD, HTN, HLD, history of PE was on eliquis until April first week, stopped it as per recommendations from hematology,CVA, s/p PCI in 2019, was admitted on 5/30 for  sob, lightheadedness, confusion.  Patient also reports occasional BPR. GI consulted, recommended no further work up at this time as he had EGD and colonoscopy in 06/2021 showing diverticulosis and internal hemorrhoids.    Assessment and Plan:  Sob, lightheadedness probably from acute PE. V/Q scan showed high probability for PE on the left lung.  Two mismatched segmental perfusion defects within the left lung.  Venous duplex of the lower extremities ordered, is negative for DVT.  Echocardiogram ordered to evaluate for right heart strain, showed Left ventricular ejection fraction, by estimation, is 55 to 60%. The left ventricle has normal  function. The left ventricle has no regional wall motion abnormalities. Left ventricular diastolic parameters were normal. Right ventricular systolic function is normal. The right ventricular size is normal. There is normal pulmonary artery systolic pressure. CT angio of the chest confirmed the pulmonary embolism.  Started the patient on IV heparin,  transitioned to oral eliquis on discharge.  Pt  hemoglobin stable around 10 so far.        BRBPR  Possibly from internal hemorrhoids Painless.  GI consulted. recommended no further work up at this time as he had EGD and colonoscopy in 06/2021 showing diverticulosis and internal hemorrhoids.  Supportive care with PPI BID.  Hemoglobin stable around 10.  Anusol cream      Hypotension: possibly from over diuresis from lasix.  Resolved with hydration.      Chest pain:  Appears to be intermittent ,not related to activity. Ekg shows NSR.  Troponin is pending.  Echocardiogram reviewed.  Start the patient on IMDUR and NTG.        AKI:  Baseline creatinine is around 1.1. Creatinine on admission was around 3.3, improved to 1.8  to 1.2. Restart lasix at a lower dose.     Type 2 DM:  Uncontrolled with hyperglycemia. Insulin dependent  Resume home insulinregimen.  Hemoglobin A1c from march 2023 is 9.3%      Hyperlipidemia on statin.      GERD:  Stable.      Normocytic anemia? Mild anemia of blood loss Baseline hemoglobin around 11.  Currently stable around 10.  H/o CVA Resume statin.      Acute Metabolic encephalopathy; from AKI.  Resolved. He is alert and oriented.      Hypertension.  BP parameters are optimal.      OSA  Supposed to use CPAP, pt non compliant. Uses oxygen instead.      Estimated body mass index is 47.45 kg/m as calculated from the following:   Height as of this encounter: '5\' 10"'$  (1.778 m).   Weight as of this encounter: 150 kg.         Consultants: GI  Procedures performed: NONE.    Disposition: Home Diet recommendation:  Discharge Diet Orders (From admission, onward)     Start     Ordered   03/26/22 0000  Diet - low sodium heart healthy        03/26/22 1025           Carb modified diet DISCHARGE MEDICATION: Allergies as of 03/26/2022       Reactions   Coconut Flavor [flavoring Agent] Hives   Coconut (cocos Nucifera) Hives   Ibuprofen Other (See Comments)   Made gastric ulcers worse   Aleve [naproxen] Other (See Comments)   DUE TO KIDNEYS   Nsaids Other (See Comments)   Stomach ulcers   Other Hives   Nut Allergy   Vascepa [icosapent Ethyl] Other (See Comments)   headaches, chest pain, similar to sx of a stroke, hypotension         Medication List     STOP taking these medications    lisinopril 20 MG tablet Commonly known as: ZESTRIL       TAKE these medications    albuterol 108 (90 Base) MCG/ACT inhaler Commonly known as: VENTOLIN HFA Inhale 2 puffs into the lungs every 6 (six) hours as needed for wheezing or shortness of breath.   apixaban 5 MG Tabs tablet Commonly known as: ELIQUIS Take 2 tablets (10 mg total) by mouth 2 (two) times daily for 3 days. What changed:  how much to take how to take this when to take this additional instructions   apixaban 5 MG Tabs tablet Commonly known as: ELIQUIS Take 1 tablet (5 mg total) by mouth 2 (two) times daily. Start taking on: March 30, 2022 What changed: You were already taking a medication with the same name, and this prescription was added. Make sure you understand how and when to take each.   atorvastatin 40 MG tablet Commonly known as: LIPITOR Take 40 mg by mouth at bedtime.   BIOFREEZE EX Apply 1 application topically 2 (two) times daily as needed (knee pain).   carvedilol 6.25 MG tablet Commonly known as: COREG Take 1 tablet (6.25 mg total) by mouth 2 (two) times daily with a meal. What changed:  medication strength how much to take when to take this   cyanocobalamin  1000 MCG tablet Take by mouth.   diclofenac 1.3 % Ptch Commonly known as: FLECTOR Place 1 patch onto the skin 2 (two) times daily as needed (back and knee pain).   fenofibrate 145 MG tablet Commonly known as: TRICOR Take 1 tablet (145 mg total) by mouth daily. What changed: when to take this   FLONASE ALLERGY RELIEF NA Place 1 spray into the nose daily as needed (congestion).   furosemide 20 MG tablet Commonly known as: LASIX Take 2 tablets (40 mg total) by mouth daily. What changed:  medication strength when to take this   gabapentin 800 MG tablet Commonly known as: NEURONTIN  Take 800 mg by mouth 3 (three) times daily.   glipiZIDE 10 MG tablet Commonly known as: GLUCOTROL Take 10 mg by mouth 2 (two) times daily.   HumuLIN R U-500 KwikPen 500 UNIT/ML KwikPen Generic drug: insulin regular human CONCENTRATED Inject 85-110 Units into the skin See admin instructions. Per sliding scale 3 times daily with meals   Insulin Pen Needle 31G X 5 MM Misc Use 1 needle daily to inject insulin as prescribed   isosorbide mononitrate 30 MG 24 hr tablet Commonly known as: IMDUR TAKE 1 TABLET BY MOUTH DAILY   nitroGLYCERIN 0.4 MG SL tablet Commonly known as: NITROSTAT Place 1 tablet (0.4 mg total) under the tongue every 5 (five) minutes as needed for chest pain.   ondansetron 4 MG tablet Commonly known as: ZOFRAN Take 4 mg by mouth 2 (two) times daily as needed for nausea/vomiting.   Oxycodone HCl 20 MG Tabs Take 20 mg by mouth 4 (four) times daily as needed (pain).   pantoprazole 40 MG tablet Commonly known as: PROTONIX Take 40 mg by mouth every morning.   potassium chloride 10 MEQ CR capsule Commonly known as: MICRO-K Take 20 mEq by mouth 2 (two) times daily.   sertraline 50 MG tablet Commonly known as: ZOLOFT Take 50 mg by mouth every morning.   tiZANidine 4 MG tablet Commonly known as: ZANAFLEX Take 4 mg by mouth at bedtime as needed for muscle spasms.   traZODone  50 MG tablet Commonly known as: DESYREL Take 1 tablet (50 mg total) by mouth at bedtime.   Vitamin D (Ergocalciferol) 1.25 MG (50000 UNIT) Caps capsule Commonly known as: DRISDOL Take 50,000 Units by mouth every Monday.        Follow-up Information     Lelon Perla, MD. Schedule an appointment as soon as possible for a visit in 1 week(s).   Specialty: Cardiology Contact information: [address] [place] [address] [phone]                Discharge Exam: Danley Danker Weights   03/23/22 1000  Weight: (!) 150 kg   General exam: Appears calm and comfortable  Respiratory system: Clear to auscultation. Respiratory effort normal. Cardiovascular system: S1 & S2 heard, RRR. No JVD, murmurs, rubs, gallops or clicks. No pedal edema. Gastrointestinal system: Abdomen is nondistended, soft and nontender. No organomegaly or masses felt. Normal bowel sounds heard. Central nervous system: Alert and oriented. No focal neurological deficits. Extremities: Symmetric 5 x 5 power. Skin: No rashes, lesions or ulcers Psychiatry: Judgement and insight appear normal. Mood & affect appropriate.    Condition at discharge: fair  The results of significant diagnostics from this hospitalization (including imaging, microbiology, ancillary and laboratory) are listed below for reference.   Imaging Studies: DG Chest 2 View  Result Date: 03/04/2022 CLINICAL DATA:  Chest pain EXAM: CHEST - 2 VIEW COMPARISON:  01/12/2022 FINDINGS: The heart size and mediastinal contours are within normal limits. Both lungs are clear. The visualized skeletal structures are unremarkable. IMPRESSION: No active cardiopulmonary disease. Electronically Signed   By: Rolm Baptise M.D.   On: 03/04/2022 19:06   CT HEAD WO CONTRAST  Result Date: 03/22/2022 CLINICAL DATA:  Vision loss EXAM: CT HEAD WITHOUT CONTRAST TECHNIQUE: Contiguous axial images were obtained from the base of the skull through the vertex  without intravenous contrast. RADIATION DOSE REDUCTION: This exam was performed according to the departmental dose-optimization program which includes automated exposure control, adjustment of the mA and/or kV  according to patient size and/or use of iterative reconstruction technique. COMPARISON:  Head CT dated January 12, 2022 FINDINGS: Brain: No evidence of acute infarction, hemorrhage, hydrocephalus, extra-axial collection or mass lesion/mass effect. Vascular: No hyperdense vessel or unexpected calcification. Skull: Normal. Negative for fracture or focal lesion. Sinuses/Orbits: No acute finding. Other: None. IMPRESSION: No acute intracranial abnormality. Electronically Signed   By: Yetta Glassman M.D.   On: 03/22/2022 11:07   CT Angio Chest Pulmonary Embolism (PE) W or WO Contrast  Addendum Date: 03/25/2022   ADDENDUM REPORT: 03/25/2022 11:13 ADDENDUM: Critical Value/emergent results were called by telephone at the time of interpretation on 03/25/2022 at 11:13 am to provider Hosie Poisson , who verbally acknowledged these results. Electronically Signed   By: Ilona Sorrel M.D.   On: 03/25/2022 11:13   Result Date: 03/25/2022 CLINICAL DATA:  Inpatient. Dyspnea. Pulmonary embolism (PE) suspected, high prob EXAM: CT ANGIOGRAPHY CHEST WITH CONTRAST TECHNIQUE: Multidetector CT imaging of the chest was performed using the standard protocol during bolus administration of intravenous contrast. Multiplanar CT image reconstructions and MIPs were obtained to evaluate the vascular anatomy. RADIATION DOSE REDUCTION: This exam was performed according to the departmental dose-optimization program which includes automated exposure control, adjustment of the mA and/or kV according to patient size and/or use of iterative reconstruction technique. CONTRAST:  44m OMNIPAQUE IOHEXOL 350 MG/ML SOLN COMPARISON:  03/23/2022 perfusion lung scan. 03/22/2022 chest radiograph. 10/08/2021 chest CT angiogram. FINDINGS: Cardiovascular: The  study is moderate to high quality for the evaluation of pulmonary embolism, with mild motion degradation and mildly suboptimal bolus opacification. There is a tiny filling defect within a subsegmental left upper lobe pulmonary artery branch (series 7/image 171 and series 8/image 80), which correlates with the perfusion defect seen on perfusion lung scan from 2 days prior, compatible with a subsegmental pulmonary embolus. No additional pulmonary artery filling defects. Mildly atherosclerotic thoracic aorta with dilated 4.3 cm ascending thoracic aorta. Prominently dilated main pulmonary artery (4.5 cm diameter), unchanged. Top-normal heart size. No significant pericardial fluid/thickening. Left anterior descending and right coronary atherosclerosis. Mediastinum/Nodes: No discrete thyroid nodules. Unremarkable esophagus. No pathologically enlarged axillary, mediastinal or hilar lymph nodes. Lungs/Pleura: No pneumothorax. No pleural effusion. Tiny calcified granuloma at the right lung base is unchanged. No acute consolidative airspace disease, lung masses or significant pulmonary nodules. Minimal dependent bibasilar linear scarring versus atelectasis, unchanged from prior CT. Upper abdomen: Diffuse hepatic steatosis. Musculoskeletal: No aggressive appearing focal osseous lesions. Moderate thoracic spondylosis. Symmetric mild bilateral gynecomastia is unchanged. Review of the MIP images confirms the above findings. IMPRESSION: 1. Solitary acute subsegmental left upper lobe pulmonary embolus, which correlates with the perfusion defect seen on perfusion lung scan from 2 days prior. No additional sites of pulmonary embolism. 2. Prominently dilated main pulmonary artery, unchanged, suggesting chronic pulmonary arterial hypertension. 3. Two-vessel coronary atherosclerosis. 4. Diffuse hepatic steatosis. 5. Aortic Atherosclerosis (ICD10-I70.0). Electronically Signed: By: JIlona SorrelM.D. On: 03/25/2022 11:06   NM Pulmonary  Perfusion  Result Date: 03/23/2022 CLINICAL DATA:  Pulmonary embolism (PE) suspected, high prob EXAM: NUCLEAR MEDICINE PERFUSION LUNG SCAN TECHNIQUE: Perfusion images were obtained in multiple projections after intravenous injection of radiopharmaceutical. Ventilation scans intentionally deferred if perfusion scan and chest x-ray adequate for interpretation during COVID 19 epidemic. RADIOPHARMACEUTICALS:  4.3 mCi Tc-953mAA IV COMPARISON:  Chest x-ray 03/22/2022 FINDINGS: Two mismatched segmental perfusion defects within the left lung. No evidence of a segmental perfusion defect within the right lung. IMPRESSION: High probability for pulmonary embolism in the left  lung. These results will be called to the ordering clinician or representative by the Radiologist Assistant, and communication documented in the PACS or [employer]. Electronically Signed   By: Davina Poke D.O.   On: 03/23/2022 09:35   DG Chest Portable 1 View  Result Date: 03/22/2022 CLINICAL DATA:  Chest pain and hypotension. EXAM: PORTABLE CHEST 1 VIEW COMPARISON:  03/04/2022 FINDINGS: Upper normal heart size. The lungs appear clear. No blunting of the costophrenic angles. Thoracic spondylosis. IMPRESSION: 1.  No active cardiopulmonary disease is radiographically apparent. 2. Thoracic spondylosis. Electronically Signed   By: Van Clines M.D.   On: 03/22/2022 19:13   ECHOCARDIOGRAM COMPLETE  Result Date: 03/23/2022    ECHOCARDIOGRAM REPORT   Patient Name:   TIRAN SAUSEDA Date of Exam: 03/23/2022 Medical Rec #:  [id number]        Height:       70.0 in Accession #:    [id number]       Weight:       330.7 lb Date of Birth:  13-Nov-1960       BSA:          2.585 m Patient Age:    40 years         BP:           94/65 mmHg Patient Gender: M                HR:           86 bpm. Exam Location:  Inpatient Procedure: 2D Echo, Cardiac Doppler, Color Doppler and Intracardiac            Opacification Agent Indications:    Pulmonary embolus   History:        Patient has prior history of Echocardiogram examinations. CHF,                 CAD, TIA; Risk Factors:Hypertension, Diabetes, Dyslipidemia and                 Sleep Apnea.  Sonographer:    Jyl Heinz Referring Phys: [id number] Hosie Poisson  Sonographer Comments: Patient is morbidly obese. Image acquisition challenging due to patient body habitus. IMPRESSIONS  1. Left ventricular ejection fraction, by estimation, is 55 to 60%. The left ventricle has normal function. The left ventricle has no regional wall motion abnormalities. Left ventricular diastolic parameters were normal.  2. Right ventricular systolic function is normal. The right ventricular size is normal. There is normal pulmonary artery systolic pressure.  3. Left atrial size was mildly dilated.  4. The mitral valve is normal in structure. No evidence of mitral valve regurgitation. No evidence of mitral stenosis.  5. The aortic valve is tricuspid. Aortic valve regurgitation is not visualized. No aortic stenosis is present.  6. Aortic dilatation noted. There is mild dilatation of the ascending aorta, measuring 37 mm.  7. The inferior vena cava is normal in size with greater than 50% respiratory variability, suggesting right atrial pressure of 3 mmHg. FINDINGS  Left Ventricle: Left ventricular ejection fraction, by estimation, is 55 to 60%. The left ventricle has normal function. The left ventricle has no regional wall motion abnormalities. Definity contrast agent was given IV to delineate the left ventricular  endocardial borders. The left ventricular internal cavity size was normal in size. There is no left ventricular hypertrophy. Left ventricular diastolic parameters were normal. Indeterminate filling pressures. Right Ventricle: The right ventricular size is normal. No increase in right ventricular wall thickness.  Right ventricular systolic function is normal. There is normal pulmonary artery systolic pressure. The tricuspid regurgitant  velocity is 1.46 m/s, and  with an assumed right atrial pressure of 3 mmHg, the estimated right ventricular systolic pressure is 51.0 mmHg. Left Atrium: Left atrial size was mildly dilated. Right Atrium: Right atrial size was normal in size. Pericardium: There is no evidence of pericardial effusion. Mitral Valve: The mitral valve is normal in structure. No evidence of mitral valve regurgitation. No evidence of mitral valve stenosis. Tricuspid Valve: The tricuspid valve is normal in structure. Tricuspid valve regurgitation is trivial. No evidence of tricuspid stenosis. Aortic Valve: The aortic valve is tricuspid. Aortic valve regurgitation is not visualized. No aortic stenosis is present. Aortic valve peak gradient measures 14.1 mmHg. Pulmonic Valve: The pulmonic valve was normal in structure. Pulmonic valve regurgitation is not visualized. No evidence of pulmonic stenosis. Aorta: Aortic dilatation noted. There is mild dilatation of the ascending aorta, measuring 37 mm. Venous: The inferior vena cava is normal in size with greater than 50% respiratory variability, suggesting right atrial pressure of 3 mmHg. IAS/Shunts: No atrial level shunt detected by color flow Doppler.  LEFT VENTRICLE PLAX 2D LVIDd:         4.80 cm      Diastology LVIDs:         3.40 cm      LV e' medial:    9.79 cm/s LV PW:         1.00 cm      LV E/e' medial:  10.7 LV IVS:        1.00 cm      LV e' lateral:   12.40 cm/s LVOT diam:     2.00 cm      LV E/e' lateral: 8.5 LV SV:         92 LV SV Index:   36 LVOT Area:     3.14 cm  LV Volumes (MOD) LV vol d, MOD A2C: 206.0 ml LV vol d, MOD A4C: 188.0 ml LV vol s, MOD A2C: 73.4 ml LV vol s, MOD A4C: 63.0 ml LV SV MOD A2C:     132.6 ml LV SV MOD A4C:     188.0 ml LV SV MOD BP:      134.6 ml RIGHT VENTRICLE             IVC RV Basal diam:  4.00 cm     IVC diam: 1.70 cm RV Mid diam:    3.20 cm RV S prime:     20.50 cm/s TAPSE (M-mode): 2.8 cm LEFT ATRIUM             Index        RIGHT ATRIUM            Index LA diam:        3.70 cm 1.43 cm/m   RA Area:     17.10 cm LA Vol (A2C):   59.9 ml 23.18 ml/m  RA Volume:   40.80 ml  15.79 ml/m LA Vol (A4C):   75.5 ml 29.21 ml/m LA Biplane Vol: 68.5 ml 26.50 ml/m  AORTIC VALVE AV Area (Vmax): 2.57 cm AV Vmax:        188.00 cm/s AV Peak Grad:   14.1 mmHg LVOT Vmax:      154.00 cm/s LVOT Vmean:     119.000 cm/s LVOT VTI:       0.293 m  AORTA Ao Root diam: 3.40 cm Ao Asc  diam:  3.70 cm MITRAL VALVE                TRICUSPID VALVE MV Area (PHT): 3.83 cm     TR Peak grad:   8.5 mmHg MV Decel Time: 198 msec     TR Vmax:        146.00 cm/s MV E velocity: 105.00 cm/s MV A velocity: 73.90 cm/s   SHUNTS MV E/A ratio:  1.42         Systemic VTI:  0.29 m                             Systemic Diam: 2.00 cm Skeet Latch MD Electronically signed by Skeet Latch MD Signature Date/Time: 03/23/2022/4:40:21 PM    Final    VAS [place] LOWER EXTREMITY VENOUS (DVT)  Result Date: 03/23/2022  Lower Venous DVT Study Patient Name:  MIROSLAV GIN  Date of Exam:   03/23/2022 Medical Rec #: [id number]         Accession #:    [id number] Date of Birth: 20-Sep-1961        Patient Gender: M Patient Age:   42 years Exam Location:  [facility] Procedure:      VAS [place] LOWER EXTREMITY VENOUS (DVT) Referring Phys: Hosie Poisson --------------------------------------------------------------------------------  Indications: Edema.  Limitations: Poor ultrasound/tissue interface and body habitus. Comparison Study: 06/28/2021 negative lower extremity venous duplex Performing Technologist: Maudry Mayhew MHA, RDMS, RVT, RDCS  Examination Guidelines: A complete evaluation includes B-mode imaging, spectral Doppler, color Doppler, and power Doppler as needed of all accessible portions of each vessel. Bilateral testing is considered an integral part of a complete examination. Limited examinations for reoccurring indications may be performed as noted. The reflux portion of the exam is performed with the  patient in reverse Trendelenburg.  +---------+---------------+---------+-----------+---------------+--------------+ RIGHT    CompressibilityPhasicitySpontaneityProperties     Thrombus Aging +---------+---------------+---------+-----------+---------------+--------------+ CFV      Full           Yes      Yes                                      +---------+---------------+---------+-----------+---------------+--------------+ SFJ      Full                                                             +---------+---------------+---------+-----------+---------------+--------------+ FV Prox  Full                                                             +---------+---------------+---------+-----------+---------------+--------------+ FV Mid   Full                                                             +---------+---------------+---------+-----------+---------------+--------------+ FV Distal  patent by color                                                           Doppler                       +---------+---------------+---------+-----------+---------------+--------------+ PFV      Full                                                             +---------+---------------+---------+-----------+---------------+--------------+ POP      Full           Yes      Yes                                      +---------+---------------+---------+-----------+---------------+--------------+ PTV      Full                                                             +---------+---------------+---------+-----------+---------------+--------------+ PERO     Full                                                             +---------+---------------+---------+-----------+---------------+--------------+   +-------+---------------+---------+-----------+----------+--------------+ LEFT    CompressibilityPhasicitySpontaneityPropertiesThrombus Aging +-------+---------------+---------+-----------+----------+--------------+ CFV    Full           Yes      Yes                                 +-------+---------------+---------+-----------+----------+--------------+ SFJ    Full                                                        +-------+---------------+---------+-----------+----------+--------------+ FV ProxFull                                                        +-------+---------------+---------+-----------+----------+--------------+ FV Mid Full                                                        +-------+---------------+---------+-----------+----------+--------------+ PFV    Full                                                        +-------+---------------+---------+-----------+----------+--------------+  POP    Full           Yes      Yes                                 +-------+---------------+---------+-----------+----------+--------------+ PTV    Full                                                        +-------+---------------+---------+-----------+----------+--------------+   Left Technical Findings: Not visualized segments include distal femoral vein, peroneal veins.   Summary: RIGHT: - There is no evidence of deep vein thrombosis in the lower extremity. However, portions of this examination were limited- see technologist comments above.  - No cystic structure found in the popliteal fossa.  LEFT: - There is no evidence of deep vein thrombosis in the lower extremity. However, portions of this examination were limited- see technologist comments above.  - No cystic structure found in the popliteal fossa.  *See table(s) above for measurements and observations. Electronically signed by Harold Barban MD on 03/23/2022 at 48:50:22 PM.    Final     Microbiology: Results for orders placed or performed during the hospital encounter of  01/12/22  Resp Panel by RT-PCR (Flu A&B, Covid) Nasopharyngeal Swab     Status: None   Collection Time: 01/12/22  4:26 AM   Specimen: Nasopharyngeal Swab; Nasopharyngeal(NP) swabs in vial transport medium  Result Value Ref Range Status   SARS Coronavirus 2 by RT PCR NEGATIVE NEGATIVE Final    Comment: (NOTE) SARS-CoV-2 target nucleic acids are NOT DETECTED.  The SARS-CoV-2 RNA is generally detectable in upper respiratory specimens during the acute phase of infection. The lowest concentration of SARS-CoV-2 viral copies this assay can detect is 138 copies/mL. A negative result does not preclude SARS-Cov-2 infection and should not be used as the sole basis for treatment or other patient management decisions. A negative result may occur with  improper specimen collection/handling, submission of specimen other than nasopharyngeal swab, presence of viral mutation(s) within the areas targeted by this assay, and inadequate number of viral copies(<138 copies/mL). A negative result must be combined with clinical observations, patient history, and epidemiological information. The expected result is Negative.  Fact Sheet for Patients:  EntrepreneurPulse.com.au  Fact Sheet for Healthcare Providers:  IncredibleEmployment.be  This test is no t yet approved or cleared by the [place] FDA and  has been authorized for detection and/or diagnosis of SARS-CoV-2 by FDA under an Emergency Use Authorization (EUA). This EUA will remain  in effect (meaning this test can be used) for the duration of the COVID-19 declaration under Section 564(b)(1) of the Act, 21 U.S.C.section 360bbb-3(b)(1), unless the authorization is terminated  or revoked sooner.       Influenza A by PCR NEGATIVE NEGATIVE Final   Influenza B by PCR NEGATIVE NEGATIVE Final    Comment: (NOTE) The Xpert Xpress SARS-CoV-2/FLU/RSV plus assay is intended as an aid in the diagnosis of influenza from  Nasopharyngeal swab specimens and should not be used as a sole basis for treatment. Nasal washings and aspirates are unacceptable for Xpert Xpress SARS-CoV-2/FLU/RSV testing.  Fact Sheet for Patients: EntrepreneurPulse.com.au  Fact Sheet for Healthcare Providers: IncredibleEmployment.be  This test is not yet approved or cleared by the [place]  States FDA and has been authorized for detection and/or diagnosis of SARS-CoV-2 by FDA under an Emergency Use Authorization (EUA). This EUA will remain in effect (meaning this test can be used) for the duration of the COVID-19 declaration under Section 564(b)(1) of the Act, 21 U.S.C. section 360bbb-3(b)(1), unless the authorization is terminated or revoked.  Performed at [facility], [place] [address] [place], [place] [address]   Blood culture (routine x 2)     Status: None   Collection Time: 01/12/22  6:25 AM   Specimen: BLOOD  Result Value Ref Range Status   Specimen Description BLOOD RIGHT ANTECUBITAL  Final   Special Requests   Final    BOTTLES DRAWN AEROBIC AND ANAEROBIC Blood Culture adequate volume   Culture   Final    NO GROWTH 5 DAYS Performed at [facility], [place] [address] [place], [place] [address]    Report Status 01/17/2022 FINAL  Final  Blood culture (routine x 2)     Status: None   Collection Time: 01/12/22  6:35 AM   Specimen: BLOOD LEFT ARM  Result Value Ref Range Status   Specimen Description BLOOD LEFT ARM  Final   Special Requests   Final    BOTTLES DRAWN AEROBIC AND ANAEROBIC Blood Culture results may not be optimal due to an excessive volume of blood received in culture bottles   Culture   Final    NO GROWTH 5 DAYS Performed at [facility], [place] [address], [place], [place] [address]    Report Status 01/17/2022 FINAL  Final    Labs: CBC: Recent Labs  Lab 03/22/22 1046 03/22/22 1051 03/23/22 0558 03/23/22 1009 03/23/22 1821 03/24/22 1633  03/25/22 0124 03/26/22 0319  WBC 9.4  --  8.5 8.5  --   --  8.2 8.1  NEUTROABS 6.4  --   --   --   --   --   --   --   HGB 10.4*   < > 10.6* 10.2* 10.4* 10.2* 10.3* 10.1*  HCT 32.3*   < > 32.4* 31.5* 31.4* 30.2* 31.4* 29.7*  MCV 96.4  --  95.0 95.2  --   --  93.2 91.7  PLT 193  --  199 209  --   --  208 212   < > = values in this interval not displayed.   Basic Metabolic Panel: Recent Labs  Lab 03/22/22 1046 03/22/22 1051 03/22/22 2000 03/22/22 2019 03/23/22 0558 03/24/22 0609 03/25/22 0124 03/26/22 0319  NA 138 139  --  142 139 142 140 139  K 4.2 4.4  --  4.1 4.0 3.9 3.8 3.8  CL 102 104  --   --  105 109 106 103  CO2 23  --   --   --  '24 26 24 26  '$ GLUCOSE 200* 198*  --   --  156* 216* 224* 236*  BUN 51* 50*  --   --  38* 23* 17 17  CREATININE 3.16* 3.30*  --   --  1.87* 1.27* 1.24 1.19  CALCIUM 9.2  --   --   --  8.9 9.9 9.7 9.7  MG  --   --  1.7  --   --   --   --   --   PHOS  --   --  3.4  --   --   --   --   --    Liver Function Tests: Recent Labs  Lab 03/22/22 1046 03/22/22 2000 03/23/22 0558  AST '21 18 19  '$ ALT '17 17 17  '$ ALKPHOS 29* 25* 29*  BILITOT 0.3 0.5 0.5  PROT 7.1 7.2 7.0  ALBUMIN 3.8 3.7 3.7   CBG: Recent Labs  Lab 03/25/22 1535 03/25/22 2006 03/25/22 2324 03/26/22 0318 03/26/22 0807  GLUCAP 313* 318* 322* 230* 225*    Discharge time spent: 41 minutes.   Signed: Hosie Poisson, MD [facility] 03/28/2022

## 2022-04-14 ENCOUNTER — Telehealth: Payer: Self-pay

## 2022-04-14 NOTE — Telephone Encounter (Signed)
I have faxed requesting provider's office to inform them the the pre-op clearance recommendations

## 2022-04-14 NOTE — Telephone Encounter (Signed)
   Name:  [name]  DOB:  03/27/1961  MRN:  [id number]   Primary Cardiologist: Kirk Ruths, MD  Chart reviewed as part of pre-operative protocol coverage. Patient was contacted 04/14/2022 in reference to pre-operative risk assessment for pending surgery as outlined below.  Patient is currently on Eliquis for PE diagnosed 3 weeks ago.  Patient also has history of recurrent VTE.  It is not advised that he currently stop his anticoagulation due to this.  The procedure should be postponed and ultimately final recommendations on anticoagulation should come from the prescribing physician.  Pre-op covering staff: - Please call patient to inform them procedure will need to be postponed and final recommendations for anticoagulation should come from prescribing physician. - Please contact requesting surgeon's office via preferred method (i.e, phone, fax) to inform them.   Elgie Collard, PA-C 04/14/2022, 3:27 PM

## 2022-04-14 NOTE — Telephone Encounter (Signed)
Pt on Eliquis for PE diagnosed 3 weeks ago, also has hx of recurrent VTE. Cannot currently stop anticoagulation due to this. Procedure will need to be postponed and final anticoag recs should come from managing/prescribing MD.

## 2022-04-14 NOTE — Telephone Encounter (Signed)
   Pre-operative Risk Assessment    Patient Name: [name]  DOB: Oct 01, 1961 MRN: [id number]      Request for Surgical Clearance    Procedure:   Right Carpal Tunnel Release  Date of Surgery:  Clearance TBD                                 Surgeon:  Dr. Emelda Brothers Surgeon's Group or Practice Name:  [facility] Phone number:  [phone] Fax number:  [phone] Jessica   Type of Clearance Requested:   - Pharmacy:  Hold Apixaban (Eliquis)     Type of Anesthesia:   IV, Local Sedation   Additional requests/questions:  Please fax a copy of Office Notes to the surgeon's office.  Signed, Elsie Lincoln Jamia Hoban   04/14/2022, 11:10 AM

## 2022-04-27 ENCOUNTER — Telehealth: Payer: Self-pay | Admitting: Pulmonary Disease

## 2022-04-27 NOTE — Telephone Encounter (Signed)
Fax received from Dr. Emelda Brothers with [facility] to perform a Right carpal tunnel release on patient.  Patient needs surgery clearance. Surgery is pending. Patient was seen on 01/24/2022. Office protocol is a risk assessment can be sent to surgeon if patient has been seen in 60 days or less.   Sending to Dr. Vaughan Browner for risk assessment or recommendations if patient needs to be seen in office prior to surgical procedure.

## 2022-05-01 ENCOUNTER — Other Ambulatory Visit: Payer: Self-pay

## 2022-05-01 ENCOUNTER — Emergency Department (HOSPITAL_COMMUNITY): Payer: Medicaid Other

## 2022-05-01 ENCOUNTER — Encounter (HOSPITAL_COMMUNITY): Payer: Self-pay | Admitting: Emergency Medicine

## 2022-05-01 ENCOUNTER — Emergency Department (HOSPITAL_COMMUNITY)
Admission: EM | Admit: 2022-05-01 | Discharge: 2022-05-01 | Disposition: A | Discharge status: Home / Self Care | Payer: Medicaid Other | Attending: Emergency Medicine | Admitting: Emergency Medicine

## 2022-05-01 DIAGNOSIS — E119 Type 2 diabetes mellitus without complications: Secondary | ICD-10-CM | POA: Diagnosis not present

## 2022-05-01 DIAGNOSIS — I509 Heart failure, unspecified: Secondary | ICD-10-CM | POA: Insufficient documentation

## 2022-05-01 DIAGNOSIS — Z86711 Personal history of pulmonary embolism: Secondary | ICD-10-CM | POA: Insufficient documentation

## 2022-05-01 DIAGNOSIS — I110 Hypertensive heart disease with heart failure: Secondary | ICD-10-CM | POA: Insufficient documentation

## 2022-05-01 DIAGNOSIS — R0602 Shortness of breath: Secondary | ICD-10-CM | POA: Insufficient documentation

## 2022-05-01 DIAGNOSIS — R0609 Other forms of dyspnea: Secondary | ICD-10-CM | POA: Insufficient documentation

## 2022-05-01 DIAGNOSIS — R109 Unspecified abdominal pain: Secondary | ICD-10-CM | POA: Insufficient documentation

## 2022-05-01 DIAGNOSIS — R079 Chest pain, unspecified: Secondary | ICD-10-CM | POA: Diagnosis not present

## 2022-05-01 DIAGNOSIS — Z7901 Long term (current) use of anticoagulants: Secondary | ICD-10-CM | POA: Diagnosis not present

## 2022-05-01 DIAGNOSIS — Z79899 Other long term (current) drug therapy: Secondary | ICD-10-CM | POA: Insufficient documentation

## 2022-05-01 DIAGNOSIS — Z794 Long term (current) use of insulin: Secondary | ICD-10-CM | POA: Diagnosis not present

## 2022-05-01 LAB — BASIC METABOLIC PANEL
Anion gap: 8 (ref 5–15)
BUN: 10 mg/dL (ref 6–20)
CO2: 27 mmol/L (ref 22–32)
Calcium: 9.1 mg/dL (ref 8.9–10.3)
Chloride: 108 mmol/L (ref 98–111)
Creatinine, Ser: 0.71 mg/dL (ref 0.61–1.24)
GFR, Estimated: >60 mL/min (ref >60)
Glucose, Bld: 178 mg/dL — ABNORMAL HIGH (ref 70–99)
Potassium: 3.7 mmol/L (ref 3.5–5.1)
Sodium: 143 mmol/L (ref 135–145)

## 2022-05-01 LAB — CBC
HCT: 31.2 % — ABNORMAL LOW (ref 39.0–52.0)
Hemoglobin: 10.1 g/dL — ABNORMAL LOW (ref 13.0–17.0)
MCH: 31 pg (ref 26.0–34.0)
MCHC: 32.4 g/dL (ref 30.0–36.0)
MCV: 95.7 fL (ref 80.0–100.0)
Platelets: 233 10*3/uL (ref 150–400)
RBC: 3.26 MIL/uL — ABNORMAL LOW (ref 4.22–5.81)
RDW: 14.8 % (ref 11.5–15.5)
WBC: 7.6 10*3/uL (ref 4.0–10.5)
nRBC: 0 % (ref 0.0–0.2)

## 2022-05-01 LAB — TROPONIN I (HIGH SENSITIVITY)
Troponin I (High Sensitivity): 6 ng/L
Troponin I (High Sensitivity): 6 ng/L

## 2022-05-01 MED ORDER — IOHEXOL 350 MG/ML SOLN
100.0000 mL | Freq: Once | INTRAVENOUS | Status: AC | PRN
  Administered 2022-05-01: 100 mL via INTRAVENOUS

## 2022-05-01 NOTE — ED Triage Notes (Signed)
Per [facility] pt c/o central chest pain and pressure radiating up into left neck onset of 11pm last night. Pain has progressed and is now a stabbing pain. Patient is currently on eliquis for PE. Also c/o exertional shortness of breath. Patient states he took 324 aspirin and 1 nitro PTA.

## 2022-05-01 NOTE — ED Notes (Signed)
Patient ambulated to restroom with steady gait and denies any shortness of breath.

## 2022-05-01 NOTE — ED Provider Notes (Signed)
[facility] DEPT Provider Note   CSN: [id number] Arrival date & time: 05/01/22  1238     History  Chief Complaint  Patient presents with   Chest Pain   Shortness of Breath    [name] is a 61 y.o. male.  The history is provided by the patient and medical records. No language interpreter was used.  Chest Pain Associated symptoms: shortness of breath   Shortness of Breath Associated symptoms: chest pain      61 yo Male with PMHx significant for HTN, DM II, CHF, CVA, MI s/p stent placement, multiple TIA's and Bilateral PEs, currently on Eliquis and prn home O2, who is BIBA for progressive Left sided CP since last night. Approximately 12 hours ago, patient first noted a dull constant Left-sided CP that radiated up toward his left neck. He was able to sleep, the awoke with persistent pain. He went to church, but left early for acutely worsening pain and SOB. His Left-sided CP became sharp at a 10/10, radiating to his left neck and LUE a/w blurred vision in his left eye. He went home took 4 baby ASA a NTG and called EMS. Upon EMS arrival pain resolved to a 7/10, where it remains in the ED. No current SOB, though patient reports ongoing DOE since Left PE dx last month. No recent trauma, cough, abdominal pain, N/V/D.   Home Medications Prior to Admission medications   Medication Sig Start Date End Date Taking? Authorizing Provider  albuterol (VENTOLIN HFA) 108 (90 Base) MCG/ACT inhaler Inhale 2 puffs into the lungs every 6 (six) hours as needed for wheezing or shortness of breath.    [provider]  apixaban (ELIQUIS) 5 MG TABS tablet Take 2 tablets (10 mg total) by mouth 2 (two) times daily for 3 days. 03/26/22 03/29/22  Hosie Poisson, MD  apixaban (ELIQUIS) 5 MG TABS tablet Take 1 tablet (5 mg total) by mouth 2 (two) times daily. 03/30/22   Hosie Poisson, MD  atorvastatin (LIPITOR) 40 MG tablet Take 40 mg by mouth at bedtime.    [provider]  carvedilol (COREG) 6.25 MG tablet Take 1 tablet (6.25 mg total) by mouth 2 (two) times daily with a meal. 03/26/22   Hosie Poisson, MD  cyanocobalamin 1000 MCG tablet Take by mouth. 03/06/20   [provider]  diclofenac (FLECTOR) 1.3 % PTCH Place 1 patch onto the skin 2 (two) times daily as needed (back and knee pain).    [provider]  fenofibrate (TRICOR) 145 MG tablet Take 1 tablet (145 mg total) by mouth daily. Patient taking differently: Take 145 mg by mouth every morning. 06/25/21   Deberah Pelton, NP  Fluticasone Propionate (FLONASE ALLERGY RELIEF NA) Place 1 spray into the nose daily as needed (congestion).    [provider]  furosemide (LASIX) 20 MG tablet Take 2 tablets (40 mg total) by mouth daily. 03/27/22   Hosie Poisson, MD  gabapentin (NEURONTIN) 800 MG tablet Take 800 mg by mouth 3 (three) times daily. 09/29/21   [provider]  glipiZIDE (GLUCOTROL) 10 MG tablet Take 10 mg by mouth 2 (two) times daily. 05/30/17   [provider]  HUMULIN R U-500 KWIKPEN 500 UNIT/ML kwikpen Inject 85-110 Units into the skin See admin instructions. Per sliding scale 3 times daily with meals 06/04/20   [provider]  Insulin Pen Needle 31G X 5 MM MISC Use 1 needle daily to inject insulin as prescribed 06/18/17  Barton Dubois, MD  isosorbide mononitrate (IMDUR) 30 MG 24 hr tablet TAKE 1 TABLET BY MOUTH DAILY Patient taking differently: Take 30 mg by mouth daily. 01/03/22   Hilty, Nadean Corwin, MD  Menthol, Topical Analgesic, (BIOFREEZE EX) Apply 1 application topically 2 (two) times daily as needed (knee pain).    [provider]  nitroGLYCERIN (NITROSTAT) 0.4 MG SL tablet Place 1 tablet (0.4 mg total) under the tongue every 5 (five) minutes as needed for chest pain. 02/06/19   Skeet Latch, MD  ondansetron (ZOFRAN) 4 MG tablet Take 4 mg by mouth 2 (two) times daily as needed for nausea/vomiting. 04/21/20   [provider]  Oxycodone HCl 20 MG TABS Take 20 mg by mouth 4 (four) times daily as needed (pain).    [provider]  pantoprazole (PROTONIX) 40 MG tablet Take 40 mg by mouth every morning. 04/13/21   [provider]  potassium chloride (MICRO-K) 10 MEQ CR capsule Take 20 mEq by mouth 2 (two) times daily. 04/03/20   [provider]  sertraline (ZOLOFT) 50 MG tablet Take 50 mg by mouth every morning. 12/17/20   [provider]  tiZANidine (ZANAFLEX) 4 MG tablet Take 4 mg by mouth at bedtime as needed for muscle spasms. 03/31/20   [provider]  traZODone (DESYREL) 50 MG tablet Take 1 tablet (50 mg total) by mouth at bedtime. 12/15/16   Florinda Marker, MD  Vitamin D, Ergocalciferol, (DRISDOL) 1.25 MG (50000 UT) CAPS capsule Take 50,000 Units by mouth every Monday. 08/15/19   [provider]      Allergies    Coconut flavor [flavoring agent], Coconut (cocos nucifera), Ibuprofen, Aleve [naproxen], Nsaids, Other, and Vascepa [icosapent ethyl]    Review of Systems   Review of Systems  Respiratory:  Positive for shortness of breath.   Cardiovascular:  Positive for chest pain.  All other systems reviewed and are negative.   Physical Exam Updated Vital Signs BP (!) 160/79 (BP Location: Right Arm)   Pulse 66   Temp 98.1 F (36.7 C) (Oral)   Resp 17   Ht '5\' 10"'$  (1.778 m)   Wt (!) 145.6 kg   SpO2 96%   BMI 46.06 kg/m  Physical Exam Vitals and nursing note reviewed.  Constitutional:      General: He is not in acute distress.    Appearance: He is well-developed. He is obese.  HENT:     Head: Atraumatic.  Eyes:     Conjunctiva/sclera: Conjunctivae normal.  Cardiovascular:     Rate and Rhythm: Normal rate and regular rhythm.     Pulses: Normal pulses.     Heart sounds: Normal heart sounds.  Pulmonary:     Breath sounds: Normal breath sounds. No wheezing or rhonchi.  Chest:     Chest wall: No tenderness.  Abdominal:     Palpations: Abdomen is  soft.     Tenderness: There is no abdominal tenderness.  Musculoskeletal:     Cervical back: Neck supple.  Skin:    Findings: No rash.  Neurological:     Mental Status: He is alert and oriented to person, place, and time.     ED Results / Procedures / Treatments   Labs (all labs ordered are listed, but only abnormal results are displayed) Labs Reviewed  BASIC METABOLIC PANEL - Abnormal; Notable for the following components:      Result Value   Glucose, Bld 178 (*)    All other components  within normal limits  CBC - Abnormal; Notable for the following components:   RBC 3.26 (*)    Hemoglobin 10.1 (*)    HCT 31.2 (*)    All other components within normal limits  TROPONIN I (HIGH SENSITIVITY)  TROPONIN I (HIGH SENSITIVITY)    EKG EKG Interpretation  Date/Time:  Sunday May 01 2022 12:51:36 EDT Ventricular Rate:  63 PR Interval:  199 QRS Duration: 88 QT Interval:  442 QTC Calculation: 453 R Axis:   49 Text Interpretation: Sinus rhythm Atrial premature complex Confirmed by Lennice Sites ([id number]) on 05/01/2022 1:42:27 PM  Radiology CT Angio Chest/Abd/Pel for Dissection W and/or Wo Contrast  Result Date: 05/01/2022 CLINICAL DATA:  Chest pain, shortness of breath, aortic dissection suspected EXAM: CT ANGIOGRAPHY CHEST, ABDOMEN AND PELVIS TECHNIQUE: Non-contrast CT of the chest was initially obtained. Multidetector CT imaging through the chest, abdomen and pelvis was performed using the standard protocol during bolus administration of intravenous contrast. Multiplanar reconstructed images and MIPs were obtained and reviewed to evaluate the vascular anatomy. RADIATION DOSE REDUCTION: This exam was performed according to the departmental dose-optimization program which includes automated exposure control, adjustment of the mA and/or kV according to patient size and/or use of iterative reconstruction technique. CONTRAST:  181m OMNIPAQUE IOHEXOL 350 MG/ML SOLN COMPARISON:  CT chest  angiogram, 03/25/2022 FINDINGS: CTA CHEST FINDINGS VASCULAR Aorta: Satisfactory opacification of the aorta. Normal contour and caliber of the thoracic aorta. No evidence of aneurysm, dissection, or other acute aortic pathology. Mild aortic atherosclerosis. Cardiovascular: No evidence of pulmonary embolism on limited non-tailored examination. Gross enlargement of the main pulmonary artery measuring up to 4.5 cm in caliber. Normal heart size. Left coronary artery calcifications. No pericardial effusion. Review of the MIP images confirms the above findings. NON VASCULAR Mediastinum/Nodes: No enlarged mediastinal, hilar, or axillary lymph nodes. Thyroid gland, trachea, and esophagus demonstrate no significant findings. Lungs/Pleura: Mild, bland appearing, bandlike scarring and or atelectasis of the bilateral lung bases. No pleural effusion or pneumothorax. Musculoskeletal: No chest wall abnormality. No acute osseous findings. Review of the MIP images confirms the above findings. CTA ABDOMEN AND PELVIS FINDINGS VASCULAR Normal contour and caliber of the abdominal aorta. No evidence of aneurysm, dissection, or other acute aortic pathology. Standard branching pattern of the abdominal aorta with solitary bilateral renal arteries. Minimal, scattered aortic atherosclerosis. Review of the MIP images confirms the above findings. NON-VASCULAR Hepatobiliary: No solid liver abnormality is seen. Hepatomegaly, maximum coronal span 24.1 cm. Hepatic steatosis. No gallstones, gallbladder wall thickening, or biliary dilatation. Pancreas: Unremarkable. No pancreatic ductal dilatation or surrounding inflammatory changes. Spleen: Normal in size without significant abnormality. Adrenals/Urinary Tract: Adrenal glands are unremarkable. Kidneys are normal, without renal calculi, solid lesion, or hydronephrosis. Bladder is unremarkable. Stomach/Bowel: Stomach is within normal limits. Appendix appears normal. No evidence of bowel wall  thickening, distention, or inflammatory changes. Lymphatic: No enlarged abdominal or pelvic lymph nodes. Reproductive: No mass or other significant abnormality. Other: No abdominal wall hernia or abnormality. No ascites. Musculoskeletal: No acute osseous findings. IMPRESSION: 1. Normal contour and caliber of the thoracic and abdominal aorta. No evidence of aneurysm, dissection, or other acute aortic pathology. Minimal, scattered aortic atherosclerosis. 2. Gross enlargement of the main pulmonary artery, as can be seen in pulmonary hypertension. 3. Coronary artery disease. 4. Hepatomegaly and hepatic steatosis. Aortic Atherosclerosis (ICD10-I70.0). Electronically Signed   By: ADelanna AhmadiM.D.   On: 05/01/2022 15:01   DG Chest 2 View  Result Date: 05/01/2022 CLINICAL DATA:  Chest pain  EXAM: CHEST - 2 VIEW COMPARISON:  Radiograph 03/22/2022, chest CT 03/25/2022 FINDINGS: Unchanged borderline enlarged cardiac silhouette. No focal airspace consolidation. There is no pleural effusion. No pneumothorax. No acute osseous abnormality. Thoracic spondylosis. IMPRESSION: Borderline enlarged cardiac silhouette.  No focal airspace disease. Electronically Signed   By: Maurine Simmering M.D.   On: 05/01/2022 14:25    Procedures Procedures    Medications Ordered in ED Medications  iohexol (OMNIPAQUE) 350 MG/ML injection 100 mL (100 mLs Intravenous Contrast Given 05/01/22 1445)    ED Course/ Medical Decision Making/ A&P                           Medical Decision Making Amount and/or Complexity of Data Reviewed Labs: ordered. Radiology: ordered.  Risk Prescription drug management.   BP (!) 160/79 (BP Location: Right Arm)   Pulse 66   Temp 98.1 F (36.7 C) (Oral)   Resp 17   Ht '5\' 10"'$  (1.778 m)   Wt (!) 145.6 kg   SpO2 96%   BMI 46.06 kg/m   12:57 PM This is a 61 year old male with multiple comorbidity which includes prior MI, prior stroke, prior PE currently on Eliquis presenting with complaints of chest  pain.  Patient reported last night he developed pain to the left side of his chest which radiates towards the left side of his neck and down his left arm with associate shortness of breath.  This morning the pain still persist and now he is noting some left eye blurry vision.  He took 4 baby aspirin as well as some nitro after church and noticed some mild improvement but symptoms not fully resolved thus prompting this ER visit.  He does not endorse any fever or worsening cough.  Denies any double vision, or loss of vision.  No nausea, diaphoresis, or dizziness.  On exam, this is an obese male resting comfortably in bed appears to be in no acute discomfort.  He has no focal neurodeficit on my initial exam.  Heart and lung sounds normal.  He has equal radial pulse bilaterally with normal strength and normal sensation throughout.  He is mentating appropriately.  However patient does have multiple comorbidities and given his presentation, will consider obtaining a dissection study as patient is markedly hypertensive with blood pressure of 660 systolic while I was in the room.  5:17 PM Patient has been seen and evaluated in the ED.  Negative delta troponin.  Ambulate without hypoxia.  Labs EKG and imaging independently viewed interpreted by me and agree with radiologist interpretation.  Electrolyte panels are reassuring, normal WBC, hemoglobin is 10.1 similar to prior.  Chest CT angiogram showing no evidence of aneurysm or dissection.  No other acute finding noted.  Patient reports he is feeling much better at this time and request to be discharged home.  I felt that this is less likely to be ACS, he is currently being managed for PE, no evidence of dissection, and his symptom is improving and he can slowly follow-up closely with his provider for further care.  Return precaution given.  This patient presents to the ED for concern of cp, this involves an extensive number of treatment options, and is a complaint  that carries with it a high risk of complications and morbidity.  The differential diagnosis includes aortic dissection, acs, PE, costochodrondritis, PNA, gastritis, GERD  Co morbidities that complicate the patient evaluation DM  HTN  CHF Additional history obtained:  Additional  history obtained from patient External records from outside source obtained and reviewed including outside notes, labs and imaging  Lab Tests:  I Ordered, and personally interpreted labs.  The pertinent results include:  as above  Imaging Studies ordered:  I ordered imaging studies including Chest CTA I independently visualized and interpreted imaging which showed no aortic dissection or acute changes I agree with the radiologist interpretation  Cardiac Monitoring:  The patient was maintained on a cardiac monitor.  I personally viewed and interpreted the cardiac monitored which showed an underlying rhythm of: NSR  Medicines ordered and prescription drug management:   I have reviewed the patients home medicines and have made adjustments as needed  Test Considered: as above  Critical Interventions: as above   Problem List / ED Course: chest pain  Shortness of breath  Reevaluation:  After the interventions noted above, I reevaluated the patient and found that they have :improved  Social Determinants of Health:   Dispostion:  After consideration of the diagnostic results and the patients response to treatment, I feel that the patent would benefit from outpt f/u.         Final Clinical Impression(s) / ED Diagnoses Final diagnoses:  Shortness of breath  Chest pain, unspecified type    Rx / DC Orders ED Discharge Orders     None         Domenic Moras, PA-C 05/01/22 1724    Lennice Sites, DO 05/05/22 1601

## 2022-05-01 NOTE — ED Notes (Signed)
Patient transported to CT 

## 2022-05-01 NOTE — Discharge Instructions (Signed)
Please continue taking Eliquis for your PE.  Follow-up closely with your cardiologist and your primary care doctor for further managements of your chest pain.  Return to the ER if your symptoms worsen or if you have any other concern.

## 2022-05-04 ENCOUNTER — Encounter: Payer: Self-pay | Admitting: Pulmonary Disease

## 2022-05-04 NOTE — Telephone Encounter (Signed)
Please advise 

## 2022-05-04 NOTE — Telephone Encounter (Signed)
I put in a separate note for pulmonary risk assessment to be sent to the surgeon. He will also need to have a routine follow up clinic visit with me but does not need to be done before surgery. Thanks

## 2022-05-04 NOTE — Progress Notes (Signed)
Pulmonary preoperative assessment note  We received a request for surgical clearance from Dr. Emelda Brothers with [facility] to perform a Right carpal tunnel release Review of the chart shows that patient has H/O COVID, PE, chronic dyspnea due to obesity, deconditioning and heart failure with mild restriction on PFTs.  There are no contraindications for surgery from pulmonary perspective.  Per [facility]) preoperative pulmonary risk index he is at low risk of pulmonary complications with estimated 2.6% pulmonary complication rate.   Respiratory complications generally occur in 1% of ASA Class I patients, 5% of ASA Class II and 10% of ASA Class III-IV patients These complications rarely result in mortality and iclude postoperative pneumonia, atelectasis, pulmonary embolism, ARDS and increased time requiring postoperative mechanical ventilation.  Overall, I recommend proceeding with the surgery if the risk for respiratory complications are outweighed by the potential benefits. This will need to be discussed between the patient and surgeon.  To reduce risks of respiratory complications, I recommend: --Pre- and post-operative incentive spirometry performed frequently while awake --Avoiding use of pancuronium during anesthesia.  Marshell Garfinkel MD [facility] See Amion for pager  If no response to pager , please call [phone] until 7pm After 7:00 pm call Elink  [phone] 05/04/2022, 12:05 PM

## 2022-05-05 NOTE — ED Provider Notes (Signed)
Physical exam Addendum for 03/22/22 ED evaluation General: NAD Cardiac: Regular heart rate Neuro: AAO x 3 Pulmonary: No acute respiratory distress    Wyvonnia Dusky, MD 05/05/22 [phone]

## 2022-05-06 NOTE — Telephone Encounter (Signed)
OV notes and clearance form have been faxed back to [facility] . Nothing further needed at this time.  

## 2022-05-11 ENCOUNTER — Ambulatory Visit: Payer: Medicaid Other | Admitting: Podiatry

## 2022-05-19 ENCOUNTER — Other Ambulatory Visit: Payer: Self-pay | Admitting: General Practice

## 2022-05-20 ENCOUNTER — Encounter (HOSPITAL_BASED_OUTPATIENT_CLINIC_OR_DEPARTMENT_OTHER): Payer: Medicaid Other | Attending: Internal Medicine | Admitting: Internal Medicine

## 2022-05-20 DIAGNOSIS — E1142 Type 2 diabetes mellitus with diabetic polyneuropathy: Secondary | ICD-10-CM | POA: Insufficient documentation

## 2022-05-20 DIAGNOSIS — L97822 Non-pressure chronic ulcer of other part of left lower leg with fat layer exposed: Secondary | ICD-10-CM | POA: Insufficient documentation

## 2022-05-20 DIAGNOSIS — L97812 Non-pressure chronic ulcer of other part of right lower leg with fat layer exposed: Secondary | ICD-10-CM | POA: Insufficient documentation

## 2022-05-20 DIAGNOSIS — I5032 Chronic diastolic (congestive) heart failure: Secondary | ICD-10-CM | POA: Insufficient documentation

## 2022-05-20 DIAGNOSIS — Z86718 Personal history of other venous thrombosis and embolism: Secondary | ICD-10-CM | POA: Diagnosis not present

## 2022-05-20 DIAGNOSIS — I890 Lymphedema, not elsewhere classified: Secondary | ICD-10-CM | POA: Insufficient documentation

## 2022-05-20 DIAGNOSIS — I110 Hypertensive heart disease with heart failure: Secondary | ICD-10-CM | POA: Diagnosis not present

## 2022-05-20 DIAGNOSIS — E11622 Type 2 diabetes mellitus with other skin ulcer: Secondary | ICD-10-CM | POA: Insufficient documentation

## 2022-05-20 DIAGNOSIS — Z8673 Personal history of transient ischemic attack (TIA), and cerebral infarction without residual deficits: Secondary | ICD-10-CM | POA: Insufficient documentation

## 2022-05-20 DIAGNOSIS — I87313 Chronic venous hypertension (idiopathic) with ulcer of bilateral lower extremity: Secondary | ICD-10-CM | POA: Diagnosis present

## 2022-05-20 NOTE — Progress Notes (Signed)
[name], [name] ([id number]) Visit Report for 05/20/2022 Chief Complaint Document Details Patient Name: Date of Service: [name], [name] 05/20/2022 8:00 A M Medical Record Number: [id number] Patient Account Number: [id number] Date of Birth/Sex: Treating RN: 20-Apr-1961 (61 y.o. [name] Primary Care Provider: PCP, NO Other Clinician: Referring Provider: Treating Provider/Extender: [name] in Treatment: 0 Information Obtained from: Patient Chief Complaint 08/16/2021: Bilateral lower extremity scattered blisters. 1 small open wound limited to skin breakdown on the right lower extremity. 08/23/2021; 1 small open wound limited to skin breakdown on the left lower extremity 01/27/2022; patient with bilateral lower extremity wounds Electronic Signature(s) Signed: 05/20/2022 10:12:22 AM By: [name] DO Entered By: [name] on 05/20/2022 09:13:09 -------------------------------------------------------------------------------- HPI Details Patient Name: Date of Service: [name], [name] 05/20/2022 8:00 A M Medical Record Number: [id number] Patient Account Number: [id number] Date of Birth/Sex: Treating RN: April 30, 1961 (61 y.o. [name], [name] Primary Care Provider: PCP, NO Other Clinician: Referring Provider: Treating Provider/Extender: [name] in Treatment: 0 History of Present Illness HPI Description: Admission 08/16/2021 [name] is a 61 year old male with a past medical history of CVA, hypertension, uncontrolled type 2 diabetes on insulin complicated by peripheral neuropathy, Chronic venous insufficiency/lymphedema, diastolic CHF, and left lower extremity DVT that presents to clinic today for an open wound to his right lower extremity. He reports an ongoing issue with blistering to his legs and weeping that wax and wane over the past 6 months. He was previously seen in our clinic in June  2022 however he had closed wounds at that time. He reported that his diuretic was increased then and helped with symptom control. About 2 months ago he developed increased blistering and swelling again. He reports wearing 20/30 compression stockings however it is unclear if he is doing this daily since he reports the stockings are very difficult to put on. He currently denies signs of infection. 10/31; patient presents for follow-up. He has tolerated the compression wraps well. The right leg wound has healed. He does have a small open wound now present on the left anterior lower extremity limited to skin breakdown. He would like to continue wrapping both legs. He states he cannot afford juxta lite compression and would like to use his compression stockings he currently owns. He denies signs of infection. 11/7; patient presents for follow-up. He has no issues or complaints today. While in office he scratched With his fingers at his right leg and now has a small open wound. He continues to have a left lower extremity wound. He tolerated the wraps well. He has his compression stockings today. He denies signs of infection. 11/14; patient presents for follow-up. He has his compression stockings today. He has no issues or complaints. Readmission 01/27/2022 [name] is a 61 year old male with a past medical history of CVA, hypertension, uncontrolled type 2 diabetes on insulin complicated by peripheral neuropathy, chronic venous insufficiency/lymphedema, diastolic CHF and left lower extremity DVT that presents to the clinic for bilateral lower extremity wounds. He states this started less than a week ago after scrubbing his legs in the shower. He has compression stockings that he reports using daily. He has been keeping the wounds covered. He denies signs of infection. 4/13; patient returns to clinic today for treatment of his chronic venous insufficiency and lymphedema wounds on the left anterior mid  tibia and the right posterior calf. We have been using silver alginate 4/20; patient presents for follow-up. He  has his compression stockings today. He has no issues or complaints. Admission 05/20/2022 [name] is a 60 year old male with a past medical history of uncontrolled type 2 diabetes on insulin, venous insufficiency/lymphedema and diastolic congestive heart failure that presents to the clinic for a 1 week history scattered open wounds to his lower extremities bilaterally. He states he has been wearing compression stockings. He has been keeping the areas covered. He denies signs of infection. Electronic Signature(s) Signed: 05/20/2022 10:12:22 AM By: [name] DO Entered By: [name] on 05/20/2022 09:14:40 -------------------------------------------------------------------------------- Physical Exam Details Patient Name: Date of Service: [name], [name] 05/20/2022 8:00 A M Medical Record Number: [id number] Patient Account Number: [id number] Date of Birth/Sex: Treating RN: 1961/06/27 (61 y.o. [name] Primary Care Provider: PCP, NO Other Clinician: Referring Provider: Treating Provider/Extender: [name] in Treatment: 0 Constitutional respirations regular, non-labored and within target range for patient.. Cardiovascular 2+ dorsalis pedis/posterior tibialis pulses. Psychiatric pleasant and cooperative. Notes Very few small scattered open wounds limited to skin breakdown to the lower extremities bilaterally. 2+ pitting edema to the knees. Dry flaking skin throughout the legs. No weeping noted. Electronic Signature(s) Signed: 05/20/2022 10:12:22 AM By: [name] DO Entered By: [name] on 05/20/2022 09:15:14 -------------------------------------------------------------------------------- Physician Orders Details Patient Name: Date of Service: [name], [name] 05/20/2022 8:00 A M Medical Record  Number: [id number] Patient Account Number: [id number] Date of Birth/Sex: Treating RN: [birth year] (61 y.o. Mare Ferrari Primary Care Provider: PCP, NO Other Clinician: Referring Provider: Treating Provider/Extender: [name] in Treatment: 0 Verbal / Phone Orders: No Diagnosis Coding ICD-10 Coding Code Description [phone] Non-pressure chronic ulcer of other part of right lower leg with fat layer exposed L97.822 Non-pressure chronic ulcer of other part of left lower leg with fat layer exposed I89.0 Lymphedema, not elsewhere classified I87.313 Chronic venous hypertension (idiopathic) with ulcer of bilateral lower extremity E11.622 Type 2 diabetes mellitus with other skin ulcer Follow-up Appointments ppointment in 1 week. - Dr. Heber [place] with [facility] Return A Bathing/ Shower/ Hygiene May shower with protection but do not get wound dressing(s) wet. Edema Control - Lymphedema / SCD / Other Elevate legs to the level of the heart or above for 30 minutes daily and/or when sitting, a frequency of: - throughout the day 3-4 times a day. Avoid standing for long periods of time. Exercise regularly Wound Treatment Wound #5 - Lower Leg Wound Laterality: Left, Medial Cleanser: Soap and Water 1 x Per Week/15 Days Discharge Instructions: May shower and wash wound with dial antibacterial soap and water prior to dressing change. Peri-Wound Care: Triamcinolone 15 (g) 1 x Per Week/15 Days Discharge Instructions: Use triamcinolone 15 (g) as directed Peri-Wound Care: Sween Lotion (Moisturizing lotion) 1 x Per Week/15 Days Discharge Instructions: Apply moisturizing lotion as directed Prim Dressing: KerraCel Ag Gelling Fiber Dressing, 4x5 in (silver alginate) 1 x Per Week/15 Days ary Discharge Instructions: Apply silver alginate to wound bed as instructed Secondary Dressing: Woven Gauze Sponge, Non-Sterile 4x4 in 1 x Per Week/15 Days Discharge Instructions: Apply over primary  dressing as directed. Compression Wrap: FourPress (4 layer compression wrap) 1 x Per Week/15 Days Discharge Instructions: Apply four layer compression as directed. May also use Miliken CoFlex 2 layer compression system as alternative. Wound #6 - Lower Leg Wound Laterality: Right, Medial Cleanser: Soap and Water 1 x Per Week/15 Days Discharge Instructions: May shower and wash wound with dial antibacterial soap and water prior to dressing change. Peri-Wound  Care: Triamcinolone 15 (g) 1 x Per Week/15 Days Discharge Instructions: Use triamcinolone 15 (g) as directed Peri-Wound Care: Sween Lotion (Moisturizing lotion) 1 x Per Week/15 Days Discharge Instructions: Apply moisturizing lotion as directed Prim Dressing: KerraCel Ag Gelling Fiber Dressing, 4x5 in (silver alginate) 1 x Per Week/15 Days ary Discharge Instructions: Apply silver alginate to wound bed as instructed Secondary Dressing: Woven Gauze Sponge, Non-Sterile 4x4 in 1 x Per Week/15 Days Discharge Instructions: Apply over primary dressing as directed. Compression Wrap: FourPress (4 layer compression wrap) 1 x Per Week/15 Days Discharge Instructions: Apply four layer compression as directed. May also use Miliken CoFlex 2 layer compression system as alternative. Electronic Signature(s) Signed: 05/20/2022 10:12:22 AM By: [name] DO Entered By: [name] on 05/20/2022 09:15:21 -------------------------------------------------------------------------------- Problem List Details Patient Name: Date of Service: [name], Rishik L. 05/20/2022 8:00 A M Medical Record Number: [id number] Patient Account Number: [id number] Date of Birth/Sex: Treating RN: 12-Jan-1961 (61 y.o. [name], [name] Primary Care Provider: PCP, NO Other Clinician: Referring Provider: Treating Provider/Extender: [name] in Treatment: 0 Active Problems ICD-10 Encounter Code Description Active Date  MDM Diagnosis [phone] Non-pressure chronic ulcer of other part of right lower leg with fat layer 05/20/2022 No Yes exposed L97.822 Non-pressure chronic ulcer of other part of left lower leg with fat layer exposed7/28/2023 No Yes I89.0 Lymphedema, not elsewhere classified 05/20/2022 No Yes I87.313 Chronic venous hypertension (idiopathic) with ulcer of bilateral lower extremity 05/20/2022 No Yes E11.622 Type 2 diabetes mellitus with other skin ulcer 05/20/2022 No Yes Inactive Problems Resolved Problems Electronic Signature(s) Signed: 05/20/2022 10:12:22 AM By: [name] DO Entered By: [name] on 05/20/2022 09:12:52 -------------------------------------------------------------------------------- Progress Note Details Patient Name: Date of Service: [name], Raijon L. 05/20/2022 8:00 A M Medical Record Number: [id number] Patient Account Number: [id number] Date of Birth/Sex: Treating RN: 02/07/1961 (61 y.o. [name] Primary Care Provider: PCP, NO Other Clinician: Referring Provider: Treating Provider/Extender: [name] in Treatment: 0 Subjective Chief Complaint Information obtained from Patient 08/16/2021: Bilateral lower extremity scattered blisters. 1 small open wound limited to skin breakdown on the right lower extremity. 08/23/2021; 1 small open wound limited to skin breakdown on the left lower extremity 01/27/2022; patient with bilateral lower extremity wounds History of Present Illness (HPI) Admission 08/16/2021 Mr. Nat Lowenthal is a 61 year old male with a past medical history of CVA, hypertension, uncontrolled type 2 diabetes on insulin complicated by peripheral neuropathy, Chronic venous insufficiency/lymphedema, diastolic CHF, and left lower extremity DVT that presents to clinic today for an open wound to his right lower extremity. He reports an ongoing issue with blistering to his legs and weeping that wax and wane over the past 6  months. He was previously seen in our clinic in June 2022 however he had closed wounds at that time. He reported that his diuretic was increased then and helped with symptom control. About 2 months ago he developed increased blistering and swelling again. He reports wearing 20/30 compression stockings however it is unclear if he is doing this daily since he reports the stockings are very difficult to put on. He currently denies signs of infection. 10/31; patient presents for follow-up. He has tolerated the compression wraps well. The right leg wound has healed. He does have a small open wound now present on the left anterior lower extremity limited to skin breakdown. He would like to continue wrapping both legs. He states he cannot afford juxta lite compression and would like to use  his compression stockings he currently owns. He denies signs of infection. 11/7; patient presents for follow-up. He has no issues or complaints today. While in office he scratched With his fingers at his right leg and now has a small open wound. He continues to have a left lower extremity wound. He tolerated the wraps well. He has his compression stockings today. He denies signs of infection. 11/14; patient presents for follow-up. He has his compression stockings today. He has no issues or complaints. Readmission 01/27/2022 [name] is a 61 year old male with a past medical history of CVA, hypertension, uncontrolled type 2 diabetes on insulin complicated by peripheral neuropathy, chronic venous insufficiency/lymphedema, diastolic CHF and left lower extremity DVT that presents to the clinic for bilateral lower extremity wounds. He states this started less than a week ago after scrubbing his legs in the shower. He has compression stockings that he reports using daily. He has been keeping the wounds covered. He denies signs of infection. 4/13; patient returns to clinic today for treatment of his chronic venous  insufficiency and lymphedema wounds on the left anterior mid tibia and the right posterior calf. We have been using silver alginate 4/20; patient presents for follow-up. He has his compression stockings today. He has no issues or complaints. Admission 05/20/2022 [name] is a 61 year old male with a past medical history of uncontrolled type 2 diabetes on insulin, venous insufficiency/lymphedema and diastolic congestive heart failure that presents to the clinic for a 1 week history scattered open wounds to his lower extremities bilaterally. He states he has been wearing compression stockings. He has been keeping the areas covered. He denies signs of infection. Patient History Unable to Obtain Patient History due to Altered Mental Status. Information obtained from Patient. Allergies ibuprofen, Aleve, NSAIDS (Non-Steroidal Anti-Inflammatory Drug), Vascepa, coconut flavor Family History Cancer - Mother,Father, Diabetes - Mother,Siblings, Hypertension - Siblings,Mother, Seizures - Mother,Siblings, Stroke - Siblings, No family history of Heart Disease, Hereditary Spherocytosis, Kidney Disease, Lung Disease, Thyroid Problems, Tuberculosis. Social History Never smoker, Marital Status - Divorced, Alcohol Use - Rarely, Drug Use - No History, Caffeine Use - Daily. Medical History Eyes Denies history of Cataracts, Glaucoma, Optic Neuritis Ear/Nose/Mouth/Throat Denies history of Chronic sinus problems/congestion, Middle ear problems Hematologic/Lymphatic Denies history of Anemia, Hemophilia, Human Immunodeficiency Virus, Lymphedema, Sickle Cell Disease Respiratory Denies history of Aspiration, Asthma, Chronic Obstructive Pulmonary Disease (COPD), Pneumothorax, Sleep Apnea, Tuberculosis Cardiovascular Patient has history of Congestive Heart Failure, Coronary Artery Disease, Deep Vein Thrombosis, Hypertension, Hypotension Denies history of Angina, Arrhythmia, Myocardial Infarction, Peripheral  Arterial Disease, Peripheral Venous Disease, Phlebitis, Vasculitis Gastrointestinal Denies history of Cirrhosis , Colitis, Crohnoos, Hepatitis A, Hepatitis B, Hepatitis C Endocrine Patient has history of Type II Diabetes Denies history of Type I Diabetes Genitourinary Denies history of End Stage Renal Disease Immunological Denies history of Lupus Erythematosus, Raynaudoos, Scleroderma Integumentary (Skin) Denies history of History of Burn Musculoskeletal Denies history of Gout, Rheumatoid Arthritis, Osteoarthritis, Osteomyelitis Neurologic Patient has history of Neuropathy Denies history of Dementia, Quadriplegia, Paraplegia, Seizure Disorder Oncologic Denies history of Received Chemotherapy, Received Radiation Psychiatric Denies history of Anorexia/bulimia, Confinement Anxiety Hospitalization/Surgery History - inpatient PE 06/2021. - 01/13/2022 low BP. Medical A Surgical History Notes nd Respiratory PE September 2022 02 dependent at night PE to left lung June 2023 Review of Systems (ROS) Constitutional Symptoms (General Health) Denies complaints or symptoms of Fatigue, Fever, Chills, Marked Weight Change. Integumentary (Skin) Complains or has symptoms of Wounds - bilateral lower legs. Objective Constitutional respirations regular, non-labored  and within target range for patient.. Vitals Time Taken: 8:06 AM, Height: 70 in, Source: Stated, Weight: 320 lbs, Source: Stated, BMI: 45.9, Temperature: 98.0 F, Pulse: 81 bpm, Respiratory Rate: 16 breaths/min, Blood Pressure: 153/90 mmHg, Capillary Blood Glucose: 165 mg/dl. Cardiovascular 2+ dorsalis pedis/posterior tibialis pulses. Psychiatric pleasant and cooperative. General Notes: Very few small scattered open wounds limited to skin breakdown to the lower extremities bilaterally. 2+ pitting edema to the knees. Dry flaking skin throughout the legs. No weeping noted. Integumentary (Hair, Skin) Wound #5 status is Open. Original  cause of wound was Gradually Appeared. The date acquired was: 05/20/2022. The wound is located on the Left,Medial Lower Leg. The wound measures 4cm length x 3.5cm width x 0.1cm depth; 10.996cm^2 area and 1.1cm^3 volume. There is Fat Layer (Subcutaneous Tissue) exposed. There is no tunneling or undermining noted. There is a medium amount of serosanguineous drainage noted. There is large (67-100%) pink granulation within the wound bed. There is a small (1-33%) amount of necrotic tissue within the wound bed. Wound #6 status is Open. Original cause of wound was Gradually Appeared. The date acquired was: 05/20/2022. The wound is located on the Right,Medial Lower Leg. The wound measures 1cm length x 0.5cm width x 0.1cm depth; 0.393cm^2 area and 0.039cm^3 volume. There is Fat Layer (Subcutaneous Tissue) exposed. There is no tunneling or undermining noted. There is a medium amount of serosanguineous drainage noted. There is large (67-100%) red granulation within the wound bed. There is a small (1-33%) amount of necrotic tissue within the wound bed including Adherent Slough. Assessment Active Problems ICD-10 Non-pressure chronic ulcer of other part of right lower leg with fat layer exposed Non-pressure chronic ulcer of other part of left lower leg with fat layer exposed Lymphedema, not elsewhere classified Chronic venous hypertension (idiopathic) with ulcer of bilateral lower extremity Type 2 diabetes mellitus with other skin ulcer Patient presents with a 1 week history of a few scattered open areas to his lower extremities bilaterally. He has a history of chronic venous insufficiency and lymphedema that is contributing to this issue. It really appears that he has been picking at his skin creating these open areas. I think he would benefit from silver alginate over the open areas TCA to the surrounding skin and compression therapy for the next week. I am hopeful that in 1 to 2 weeks his wounds will be  healed. Follow-up in 1 week. He knows to not get the wrap wet or keep this on for more than 7 days. Plan Follow-up Appointments: Return Appointment in 1 week. - Dr. Heber  with [facility] Bathing/ Shower/ Hygiene: May shower with protection but do not get wound dressing(s) wet. Edema Control - Lymphedema / SCD / Other: Elevate legs to the level of the heart or above for 30 minutes daily and/or when sitting, a frequency of: - throughout the day 3-4 times a day. Avoid standing for long periods of time. Exercise regularly WOUND #5: - Lower Leg Wound Laterality: Left, Medial Cleanser: Soap and Water 1 x Per Week/15 Days Discharge Instructions: May shower and wash wound with dial antibacterial soap and water prior to dressing change. Peri-Wound Care: Triamcinolone 15 (g) 1 x Per Week/15 Days Discharge Instructions: Use triamcinolone 15 (g) as directed Peri-Wound Care: Sween Lotion (Moisturizing lotion) 1 x Per Week/15 Days Discharge Instructions: Apply moisturizing lotion as directed Prim Dressing: KerraCel Ag Gelling Fiber Dressing, 4x5 in (silver alginate) 1 x Per Week/15 Days ary Discharge Instructions: Apply silver alginate to wound bed as instructed  Secondary Dressing: Woven Gauze Sponge, Non-Sterile 4x4 in 1 x Per Week/15 Days Discharge Instructions: Apply over primary dressing as directed. Com pression Wrap: FourPress (4 layer compression wrap) 1 x Per Week/15 Days Discharge Instructions: Apply four layer compression as directed. May also use Miliken CoFlex 2 layer compression system as alternative. WOUND #6: - Lower Leg Wound Laterality: Right, Medial Cleanser: Soap and Water 1 x Per Week/15 Days Discharge Instructions: May shower and wash wound with dial antibacterial soap and water prior to dressing change. Peri-Wound Care: Triamcinolone 15 (g) 1 x Per Week/15 Days Discharge Instructions: Use triamcinolone 15 (g) as directed Peri-Wound Care: Sween Lotion (Moisturizing lotion) 1 x Per  Week/15 Days Discharge Instructions: Apply moisturizing lotion as directed Prim Dressing: KerraCel Ag Gelling Fiber Dressing, 4x5 in (silver alginate) 1 x Per Week/15 Days ary Discharge Instructions: Apply silver alginate to wound bed as instructed Secondary Dressing: Woven Gauze Sponge, Non-Sterile 4x4 in 1 x Per Week/15 Days Discharge Instructions: Apply over primary dressing as directed. Com pression Wrap: FourPress (4 layer compression wrap) 1 x Per Week/15 Days Discharge Instructions: Apply four layer compression as directed. May also use Miliken CoFlex 2 layer compression system as alternative. 1. Silver alginate under 4-layer compression 2. Follow-up in 1 week Electronic Signature(s) Signed: 05/20/2022 10:12:22 AM By: [name] DO Entered By: [name] on 05/20/2022 09:19:09 -------------------------------------------------------------------------------- HxROS Details Patient Name: Date of Service: [name], Akbar L. 05/20/2022 8:00 A M Medical Record Number: [id number] Patient Account Number: [id number] Date of Birth/Sex: Treating RN: [birth year] (61 y.o. Mare Ferrari Primary Care Provider: PCP, NO Other Clinician: Referring Provider: Treating Provider/Extender: [name] in Treatment: 0 Unable to Obtain Patient History due to Altered Mental Status Information Obtained From Patient Constitutional Symptoms (General Health) Complaints and Symptoms: Negative for: Fatigue; Fever; Chills; Marked Weight Change Integumentary (Skin) Complaints and Symptoms: Positive for: Wounds - bilateral lower legs Medical History: Negative for: History of Burn Eyes Medical History: Negative for: Cataracts; Glaucoma; Optic Neuritis Ear/Nose/Mouth/Throat Medical History: Negative for: Chronic sinus problems/congestion; Middle ear problems Hematologic/Lymphatic Medical History: Negative for: Anemia; Hemophilia; Human Immunodeficiency Virus;  Lymphedema; Sickle Cell Disease Respiratory Medical History: Negative for: Aspiration; Asthma; Chronic Obstructive Pulmonary Disease (COPD); Pneumothorax; Sleep Apnea; Tuberculosis Past Medical History Notes: PE September 2022 02 dependent at night PE to left lung June 2023 Cardiovascular Medical History: Positive for: Congestive Heart Failure; Coronary Artery Disease; Deep Vein Thrombosis; Hypertension; Hypotension Negative for: Angina; Arrhythmia; Myocardial Infarction; Peripheral Arterial Disease; Peripheral Venous Disease; Phlebitis; Vasculitis Gastrointestinal Medical History: Negative for: Cirrhosis ; Colitis; Crohns; Hepatitis A; Hepatitis B; Hepatitis C Endocrine Medical History: Positive for: Type II Diabetes Negative for: Type I Diabetes Time with diabetes: 11 years Treated with: Insulin, Oral agents Blood sugar tested every day: Yes Tested : TID Blood sugar testing results: Breakfast: 200 Genitourinary Medical History: Negative for: End Stage Renal Disease Immunological Medical History: Negative for: Lupus Erythematosus; Raynauds; Scleroderma Musculoskeletal Medical History: Negative for: Gout; Rheumatoid Arthritis; Osteoarthritis; Osteomyelitis Neurologic Medical History: Positive for: Neuropathy Negative for: Dementia; Quadriplegia; Paraplegia; Seizure Disorder Oncologic Medical History: Negative for: Received Chemotherapy; Received Radiation Psychiatric Medical History: Negative for: Anorexia/bulimia; Confinement Anxiety Immunizations Pneumococcal Vaccine: Received Pneumococcal Vaccination: No Implantable Devices None Hospitalization / Surgery History Type of Hospitalization/Surgery inpatient PE 06/2021 01/13/2022 low BP Family and Social History Cancer: Yes - Mother,Father; Diabetes: Yes - Mother,Siblings; Heart Disease: No; Hereditary Spherocytosis: No; Hypertension: Yes - Siblings,Mother; Kidney Disease: No; Lung Disease: No; Seizures: Yes -  Mother,Siblings;  Stroke: Yes - Siblings; Thyroid Problems: No; Tuberculosis: No; Never smoker; Marital Status - Divorced; Alcohol Use: Rarely; Drug Use: No History; Caffeine Use: Daily; Financial Concerns: No; Food, Clothing or Shelter Needs: No; Support System Lacking: No; Transportation Concerns: No Electronic Signature(s) Signed: 05/20/2022 10:12:22 AM By: [name] DO Signed: 05/20/2022 12:58:23 PM By: Sharyn Creamer RN, BSN Entered By: Sharyn Creamer on 05/20/2022 08:11:32 -------------------------------------------------------------------------------- [place] Details Patient Name: Date of Service: [name], Juan VENHUIZEN 05/20/2022 Medical Record Number: [id number] Patient Account Number: [id number] Date of Birth/Sex: Treating RN: [birth year] (61 y.o. 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 ICD-10 Diagnosis Description L97.812 Non-pressure chronic ulcer of other part of right lower leg with fat layer exposed L97.822  Non-pressure chronic ulcer of other part of left lower leg with fat layer exposed I89.0 Lymphedema, not elsewhere classified I87.313 Chronic venous hypertension  (idiopathic) with ulcer of bilateral lower extremity Modifier: Quantity: 1 Physician Procedures : CPT4 Code Description Modifier [id number] - WC PHYS LEVEL 3 - EST PT ICD-10 Diagnosis Description G53.646 Non-pressure chronic ulcer of other part of right lower leg with fat layer exposed L97.822 Non-pressure chronic ulcer of other part of left  lower leg with fat layer exposed 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

## 2022-05-20 NOTE — Progress Notes (Signed)
[name], [name] ([id number]) Visit Report for 05/20/2022 Abuse Risk Screen Details Patient Name: Date of Service: [name], [name] 05/20/2022 8:00 A M Medical Record Number: [id number] Patient Account Number: [id number] Date of Birth/Sex: Treating RN: 03-12-1961 (61 y.o. Mare Ferrari Primary Care Osric Klopf: PCP, NO Other Clinician: Referring Gussie Towson: Treating Kathya Wilz/Extender: Mayer Camel in Treatment: 0 Abuse Risk Screen Items Answer ABUSE RISK SCREEN: Has anyone close to you tried to hurt or harm you recentlyo No Do you feel uncomfortable with anyone in your familyo No Has anyone forced you do things that you didnt want to doo No Electronic Signature(s) Signed: 05/20/2022 12:58:23 PM By: Sharyn Creamer RN, BSN Entered By: Sharyn Creamer on 05/20/2022 08:12:10 -------------------------------------------------------------------------------- Activities of Daily Living Details Patient Name: Date of Service: [name], [name] 05/20/2022 8:00 A M Medical Record Number: [id number] Patient Account Number: [id number] Date of Birth/Sex: Treating RN: Jan 24, 1961 (61 y.o. Mare Ferrari Primary Care Keath Matera: PCP, NO Other Clinician: Referring Angelys Yetman: Treating Xavyer Steenson/Extender: Mayer Camel in Treatment: 0 Activities of Daily Living Items Answer Activities of Daily Living (Please select one for each item) Drive Automobile Completely Able T Medications ake Completely Able Use T elephone Completely Able Care for Appearance Completely Able Use T oilet Completely Able Bath / Shower Completely Able Dress Self Completely Able Feed Self Completely Able Walk Completely Able Get In / Out Bed Completely Able Housework Completely Able Prepare Meals Completely [place] for Self Completely Able Electronic Signature(s) Signed: 05/20/2022 12:58:23 PM By: Sharyn Creamer RN, BSN Entered By: Sharyn Creamer on 05/20/2022 08:12:38 -------------------------------------------------------------------------------- Education Screening Details Patient Name: Date of Service: [name], [name] 05/20/2022 8:00 A M Medical Record Number: [id number] Patient Account Number: [id number] Date of Birth/Sex: Treating RN: June 13, 1961 (61 y.o. Mare Ferrari Primary Care Mekala Winger: PCP, NO Other Clinician: Referring Alysa Duca: Treating Medard Decuir/Extender: Mayer Camel in Treatment: 0 Learning Preferences/Education Level/Primary Language Learning Preference: Explanation, Demonstration, Printed Material Preferred Language: English Cognitive Barrier Language Barrier: No Translator Needed: No Memory Deficit: No Emotional Barrier: No Cultural/Religious Beliefs Affecting Medical Care: No Physical Barrier Impaired Vision: No Impaired Hearing: No Decreased Hand dexterity: No Knowledge/Comprehension Knowledge Level: High Comprehension Level: High Ability to understand written instructions: High Ability to understand verbal instructions: High Motivation Anxiety Level: Calm Cooperation: Cooperative Education Importance: Acknowledges Need Interest in Health Problems: Asks Questions Perception: Coherent Willingness to Engage in Self-Management High Activities: Readiness to Engage in Self-Management High Activities: Electronic Signature(s) Signed: 05/20/2022 12:58:23 PM By: Sharyn Creamer RN, BSN Entered By: Sharyn Creamer on 05/20/2022 08:13:10 -------------------------------------------------------------------------------- Fall Risk Assessment Details Patient Name: Date of Service: [name], [name] 05/20/2022 8:00 A M Medical Record Number: [id number] Patient Account Number: [id number] Date of Birth/Sex: Treating RN: [birth year] (61 y.o. Mare Ferrari Primary Care Rifka Ramey: PCP, NO Other Clinician: Referring Shimon Trowbridge: Treating Alrick Cubbage/Extender: Mayer Camel in Treatment: 0 Fall Risk Assessment Items Have you had 2 or more falls in the last 12 monthso 0 Yes Have you had any fall that resulted in injury in the last 12 monthso 0 No FALLS RISK SCREEN History of falling - immediate or within 3 months 0 No Secondary diagnosis (Do you have 2 or more medical diagnoseso) 15 Yes Ambulatory aid None/bed rest/wheelchair/nurse 0 No Crutches/cane/walker 0 No Furniture 0 No Intravenous therapy Access/Saline/Heparin Lock 0 No Gait/Transferring Normal/ bed rest/ wheelchair 0 No Weak (short steps with or without shuffle, stooped but  able to lift head while walking, may seek 0 No support from furniture) Impaired (short steps with shuffle, may have difficulty arising from chair, head down, impaired 0 No balance) Mental Status Oriented to own ability 0 No Electronic Signature(s) Signed: 05/20/2022 12:58:23 PM By: Sharyn Creamer RN, BSN Entered By: Sharyn Creamer on 05/20/2022 08:13:54 -------------------------------------------------------------------------------- Foot Assessment Details Patient Name: Date of Service: [name], [name] 05/20/2022 8:00 A M Medical Record Number: [id number] Patient Account Number: [id number] Date of Birth/Sex: Treating RN: 1961-07-12 (61 y.o. Mare Ferrari Primary Care Verniece Encarnacion: PCP, NO Other Clinician: Referring Kvion Shapley: Treating Rocio Wolak/Extender: Mayer Camel in Treatment: 0 Foot Assessment Items Site Locations + = Sensation present, - = Sensation absent, C = Callus, U = Ulcer R = Redness, W = Warmth, M = Maceration, PU = Pre-ulcerative lesion F = Fissure, S = Swelling, D = Dryness Assessment Right: Left: Other Deformity: No No Prior Foot Ulcer: No No Prior Amputation: No No Charcot Joint: No No Ambulatory Status: Gait: Electronic Signature(s) Signed: 05/20/2022 12:58:23 PM By: Sharyn Creamer RN, BSN Entered By: Sharyn Creamer on 05/20/2022  08:18:16 -------------------------------------------------------------------------------- Nutrition Risk Screening Details Patient Name: Date of Service: [name], [name] 05/20/2022 8:00 A M Medical Record Number: [id number] Patient Account Number: [id number] Date of Birth/Sex: Treating RN: Mar 24, 1961 (61 y.o. Mare Ferrari Primary Care Baylyn Sickles: PCP, NO Other Clinician: Referring Myriam Brandhorst: Treating Sylvio Weatherall/Extender: Mayer Camel in Treatment: 0 Height (in): 70 Weight (lbs): 320 Body Mass Index (BMI): 45.9 Nutrition Risk Screening Items Score Screening NUTRITION RISK SCREEN: I have an illness or condition that made me change the kind and/or amount of food I eat 0 No I eat fewer than two meals per day 0 No I eat few fruits and vegetables, or milk products 0 No I have three or more drinks of beer, liquor or wine almost every day 0 No I have tooth or mouth problems that make it hard for me to eat 0 No I don't always have enough money to buy the food I need 0 No I eat alone most of the time 0 No I take three or more different prescribed or over-the-counter drugs a day 0 No Without wanting to, I have lost or gained 10 pounds in the last six months 0 No I am not always physically able to shop, cook and/or feed myself 0 No Nutrition Protocols Good Risk Protocol Moderate Risk Protocol High Risk Proctocol Risk Level: Good Risk Score: 0 Electronic Signature(s) Signed: 05/20/2022 12:58:23 PM By: Sharyn Creamer RN, BSN Entered By: Sharyn Creamer on 05/20/2022 08:14:08

## 2022-05-26 NOTE — Progress Notes (Signed)
HASON, OFARRELL ([id number]) Visit Report for 05/20/2022 Allergy List Details Patient Name: Date of Service: [name], [name] 05/20/2022 8:00 A M Medical Record Number: [id number] Patient Account Number: [id number] Date of Birth/Sex: Treating RN: [birth year] (61 y.o. Mare Ferrari Primary Care Rayni Nemitz: PCP, NO Other Clinician: Referring Willine Schwalbe: Treating Claris Guymon/Extender: Mayer Camel in Treatment: 0 Allergies Active Allergies ibuprofen Aleve NSAIDS (Non-Steroidal Anti-Inflammatory Drug) Vascepa coconut flavor Allergy Notes Electronic Signature(s) Signed: 05/20/2022 12:58:23 PM By: Sharyn Creamer RN, BSN Entered By: Sharyn Creamer on 05/20/2022 08:08:37 -------------------------------------------------------------------------------- Arrival Information Details Patient Name: Date of Service: [name], [name] 05/20/2022 8:00 A M Medical Record Number: [id number] Patient Account Number: [id number] Date of Birth/Sex: Treating RN: 07/03/1961 (61 y.o. Mare Ferrari Primary Care Lilyana Lippman: PCP, NO Other Clinician: Referring [place] Sorn: Treating Aireonna Bauer/Extender: Mayer Camel in Treatment: 0 Visit Information Patient Arrived: Ambulatory Arrival Time: 08:04 Accompanied By: self Transfer Assistance: None Patient Identification Verified: Yes Secondary Verification Process Completed: Yes Patient Requires Transmission-Based No Precautions: Patient Has Alerts: Yes Patient Alerts: Patient on Blood Thinner Eliquis 09/06/21 ABI L1.01 office 09/06/21 ABI R1.02 office History Since Last Visit Added or deleted any medications: Yes Any new allergies or adverse reactions: No Had a fall or experienced change in activities of daily living that may affect risk of falls: No Signs or symptoms of abuse/neglect since last visito No Hospitalized since last visit: No Implantable device outside of the clinic excluding cellular tissue  based products placed in the center since last visit: No Has Dressing in Place as Prescribed: Yes Electronic Signature(s) Signed: 05/20/2022 12:58:23 PM By: Sharyn Creamer RN, BSN Entered By: Sharyn Creamer on 05/20/2022 12:55:31 -------------------------------------------------------------------------------- Clinic Level of Care Assessment Details Patient Name: Date of Service: [name], Juan JUMPER. 05/20/2022 8:00 A M Medical Record Number: [id number] Patient Account Number: [id number] Date of Birth/Sex: Treating RN: November 24, 1960 (61 y.o. Mare Ferrari Primary Care Sandford Diop: PCP, NO Other Clinician: Referring Krystl Wickware: Treating Kruz Chiu/Extender: Mayer Camel in Treatment: 0 Clinic Level of Care Assessment Items TOOL 1 Quantity Score X- 1 0 Use when EandM and Procedure is performed on INITIAL visit ASSESSMENTS - Nursing Assessment / Reassessment X- 1 20 General Physical Exam (combine w/ comprehensive assessment (listed just below) when performed on new pt. evals) X- 1 25 Comprehensive Assessment (HX, ROS, Risk Assessments, Wounds Hx, etc.) ASSESSMENTS - Wound and Skin Assessment / Reassessment '[]'$  - 0 Dermatologic / Skin Assessment (not related to wound area) ASSESSMENTS - Ostomy and/or Continence Assessment and Care '[]'$  - 0 Incontinence Assessment and Management '[]'$  - 0 Ostomy Care Assessment and Management (repouching, etc.) PROCESS - Coordination of Care '[]'$  - 0 Simple Patient / Family Education for ongoing care X- 1 20 Complex (extensive) Patient / Family Education for ongoing care X- 1 10 Staff obtains Programmer, systems, Records, T Results / Process Orders est '[]'$  - 0 Staff telephones HHA, Nursing Homes / Clarify orders / etc '[]'$  - 0 Routine Transfer to another Facility (non-emergent condition) '[]'$  - 0 Routine Hospital Admission (non-emergent condition) X- 1 15 New Admissions / Biomedical engineer / Ordering NPWT Apligraf, etc. , '[]'$  - 0 Emergency  Hospital Admission (emergent condition) PROCESS - Special Needs '[]'$  - 0 Pediatric / Minor Patient Management '[]'$  - 0 Isolation Patient Management '[]'$  - 0 Hearing / Language / Visual special needs '[]'$  - 0 Assessment of Community assistance (transportation, D/C planning, etc.) '[]'$  - 0 Additional assistance / Altered mentation '[]'$  - 0 Support Surface(s)  Assessment (bed, cushion, seat, etc.) INTERVENTIONS - Miscellaneous '[]'$  - 0 External ear exam '[]'$  - 0 Patient Transfer (multiple staff / Civil Service fast streamer / Similar devices) '[]'$  - 0 Simple Staple / Suture removal (25 or less) '[]'$  - 0 Complex Staple / Suture removal (26 or more) '[]'$  - 0 Hypo/Hyperglycemic Management (do not check if billed separately) '[]'$  - 0 Ankle / Brachial Index (ABI) - do not check if billed separately Has the patient been seen at the hospital within the last three years: Yes Total Score: 90 Level Of Care: New/Established - Level 3 Electronic Signature(s) Signed: 05/20/2022 12:58:23 PM By: Sharyn Creamer RN, BSN Entered By: Sharyn Creamer on 05/20/2022 09:50:22 -------------------------------------------------------------------------------- Compression Therapy Details Patient Name: Date of Service: [name], [name] 05/20/2022 8:00 A M Medical Record Number: [id number] Patient Account Number: [id number] Date of Birth/Sex: Treating RN: 1961/05/25 (61 y.o. Mare Ferrari Primary Care Anjuli Gemmill: PCP, NO Other Clinician: Referring Jenavie Stanczak: Treating Meilyn Heindl/Extender: Mayer Camel in Treatment: 0 Compression Therapy Performed for Wound Assessment: Wound #5 Left,Medial Lower Leg Performed By: Clinician Sharyn Creamer, RN Compression Type: Four Layer Post Procedure Diagnosis Same as Pre-procedure Electronic Signature(s) Signed: 05/20/2022 12:58:23 PM By: Sharyn Creamer RN, BSN Entered By: Sharyn Creamer on 05/20/2022  09:50:58 -------------------------------------------------------------------------------- Compression Therapy Details Patient Name: Date of Service: [name], [name] 05/20/2022 8:00 A M Medical Record Number: [id number] Patient Account Number: [id number] Date of Birth/Sex: Treating RN: February 09, 1961 (61 y.o. Mare Ferrari Primary Care Zhuri Krass: PCP, NO Other Clinician: Referring Sirenity Shew: Treating Chanta Bauers/Extender: Mayer Camel in Treatment: 0 Compression Therapy Performed for Wound Assessment: Wound #6 Right,Medial Lower Leg Performed By: Clinician Sharyn Creamer, RN Compression Type: Four Layer Post Procedure Diagnosis Same as Pre-procedure Electronic Signature(s) Signed: 05/20/2022 12:58:23 PM By: Sharyn Creamer RN, BSN Entered By: Sharyn Creamer on 05/20/2022 09:50:58 -------------------------------------------------------------------------------- Encounter Discharge Information Details Patient Name: Date of Service: [name], [name] 05/20/2022 8:00 A M Medical Record Number: [id number] Patient Account Number: [id number] Date of Birth/Sex: Treating RN: [birth year] (61 y.o. Mare Ferrari Primary Care Lourie Retz: PCP, NO Other Clinician: Referring Savaya Hakes: Treating Kayley Zeiders/Extender: Mayer Camel in Treatment: 0 Encounter Discharge Information Items Discharge Condition: Stable Ambulatory Status: Ambulatory Discharge Destination: Home Transportation: Private Auto Accompanied By: self Schedule Follow-up Appointment: Yes Clinical Summary of Care: Patient Declined Electronic Signature(s) Signed: 05/20/2022 12:58:23 PM By: Sharyn Creamer RN, BSN Entered By: Sharyn Creamer on 05/20/2022 09:52:14 -------------------------------------------------------------------------------- Lower Extremity Assessment Details Patient Name: Date of Service: [name], [name] 05/20/2022 8:00 A M Medical Record Number:  [id number] Patient Account Number: [id number] Date of Birth/Sex: Treating RN: 01/02/1961 (61 y.o. Mare Ferrari Primary Care Samoria Fedorko: PCP, NO Other Clinician: Referring Exilda Wilhite: Treating Jushua Waltman/Extender: Mayer Camel in Treatment: 0 Edema Assessment Assessed: Shirlyn Goltz: Yes] Patrice Paradise: Yes] Edema: [Left: Yes] [Right: Yes] Calf Left: Right: Point of Measurement: 33 cm From Medial Instep 48.5 cm 47 cm Ankle Left: Right: Point of Measurement: 13 cm From Medial Instep 27.5 cm 27.2 cm Vascular Assessment Pulses: Dorsalis Pedis Palpable: [Left:Yes] [Right:Yes] Electronic Signature(s) Signed: 05/20/2022 12:58:23 PM By: Sharyn Creamer RN, BSN Entered By: Sharyn Creamer on 05/20/2022 08:22:25 -------------------------------------------------------------------------------- Multi Wound Chart Details Patient Name: Date of Service: [name], [name] 05/20/2022 8:00 A M Medical Record Number: [id number] Patient Account Number: [id number] Date of Birth/Sex: Treating RN: 1961/09/22 (61 y.o. Erie Noe Primary Care Neriyah Cercone: PCP, NO Other Clinician: Referring Etienne Mowers: Treating Ezme Duch/Extender: Mayer Camel in  Treatment: 0 Vital Signs Height(in): 70 Capillary Blood Glucose(mg/dl): 165 Weight(lbs): 320 Pulse(bpm): 81 Body Mass Index(BMI): 45.9 Blood Pressure(mmHg): 153/90 Temperature(F): 98.0 Respiratory Rate(breaths/min): 16 Photos: [N/A:N/A] Left, Medial Lower Leg Right, Medial Lower Leg N/A Wound Location: Gradually Appeared Gradually Appeared N/A Wounding Event: Diabetic Wound/Ulcer of the Lower Diabetic Wound/Ulcer of the Lower N/A Primary Etiology: Extremity Extremity Congestive Heart Failure, Coronary Congestive Heart Failure, Coronary N/A Comorbid History: Artery Disease, Deep Vein Artery Disease, Deep Vein Thrombosis, Hypertension, Thrombosis, Hypertension, Hypotension, Type II Diabetes, Hypotension, Type  II Diabetes, Neuropathy Neuropathy 05/20/2022 05/20/2022 N/A Date Acquired: 0 0 N/A Weeks of Treatment: Open Open N/A Wound Status: No No N/A Wound Recurrence: 4x3.5x0.1 1x0.5x0.1 N/A Measurements L x W x D (cm) 10.996 0.393 N/A A (cm) : rea 1.1 0.039 N/A Volume (cm) : 0.00% 0.00% N/A % Reduction in A rea: 0.00% 0.00% N/A % Reduction in Volume: Grade 2 Grade 2 N/A Classification: Medium Medium N/A Exudate A mount: Serosanguineous Serosanguineous N/A Exudate Type: red, brown red, brown N/A Exudate Color: Large (67-100%) Large (67-100%) N/A Granulation A mount: Pink Red N/A Granulation Quality: Small (1-33%) Small (1-33%) N/A Necrotic A mount: Fat Layer (Subcutaneous Tissue): Yes Fat Layer (Subcutaneous Tissue): Yes N/A Exposed Structures: Fascia: No Fascia: No Tendon: No Tendon: No Muscle: No Muscle: No Joint: No Joint: No Bone: No Bone: No None None N/A Epithelialization: Treatment Notes Electronic Signature(s) Signed: 05/20/2022 10:12:22 AM By: [name] DO Signed: 05/26/2022 3:56:15 PM By: Rhae Hammock RN Entered By: [name] on 05/20/2022 09:13:00 -------------------------------------------------------------------------------- Multi-Disciplinary Care Plan Details Patient Name: Date of Service: [name], [name] 05/20/2022 8:00 A M Medical Record Number: [id number] Patient Account Number: [id number] Date of Birth/Sex: Treating RN: 1961-04-20 (61 y.o. Mare Ferrari Primary Care Angellee Cohill: PCP, NO Other Clinician: Referring Jerrelle Michelsen: Treating Caylee Vlachos/Extender: Mayer Camel in Treatment: 0 Active Inactive Venous Leg Ulcer Nursing Diagnoses: Knowledge deficit related to disease process and management Goals: Patient will maintain optimal edema control Date Initiated: 05/20/2022 Target Resolution Date: 06/17/2022 Goal Status: Active Patient/caregiver will verbalize understanding of disease process and  disease management Date Initiated: 05/20/2022 Target Resolution Date: 06/17/2022 Goal Status: Active Interventions: Assess peripheral edema status every visit. Compression as ordered Provide education on venous insufficiency Notes: Wound/Skin Impairment Nursing Diagnoses: Impaired tissue integrity Knowledge deficit related to ulceration/compromised skin integrity Goals: Patient/caregiver will verbalize understanding of skin care regimen Date Initiated: 05/20/2022 Target Resolution Date: 06/17/2022 Goal Status: Active Ulcer/skin breakdown will have a volume reduction of 30% by week 4 Date Initiated: 05/20/2022 Target Resolution Date: 06/17/2022 Goal Status: Active Interventions: Assess patient/caregiver ability to obtain necessary supplies Assess patient/caregiver ability to perform ulcer/skin care regimen upon admission and as needed Assess ulceration(s) every visit Provide education on ulcer and skin care Notes: Electronic Signature(s) Signed: 05/20/2022 12:58:23 PM By: Sharyn Creamer RN, BSN Entered By: Sharyn Creamer on 05/20/2022 08:46:31 -------------------------------------------------------------------------------- Pain Assessment Details Patient Name: Date of Service: [name], [name] 05/20/2022 8:00 A M Medical Record Number: [id number] Patient Account Number: [id number] Date of Birth/Sex: Treating RN: 09/18/1961 (60 y.o. Mare Ferrari Primary Care Lun Muro: PCP, NO Other Clinician: Referring Taziah Difatta: Treating Jen Benedict/Extender: Mayer Camel in Treatment: 0 Active Problems Location of Pain Severity and Description of Pain Patient Has Paino No Site Locations Pain Management and Medication Current Pain Management: Electronic Signature(s) Signed: 05/20/2022 12:58:23 PM By: Sharyn Creamer RN, BSN Entered By: Sharyn Creamer on 05/20/2022  08:31:06 -------------------------------------------------------------------------------- Patient/Caregiver Education Details Patient Name: Date of Service:  [name], [name] 7/28/2023andnbsp8:00 A M Medical Record Number: [id number] Patient Account Number: [id number] Date of Birth/Gender: Treating RN: [birth year] (61 y.o. Mare Ferrari Primary Care Physician: PCP, NO Other Clinician: Referring Physician: Treating Physician/Extender: Mayer Camel in Treatment: 0 Education Assessment Education Provided To: Patient Education Topics Provided Venous: Methods: Explain/Verbal Responses: State content correctly Wound/Skin Impairment: Methods: Explain/Verbal Responses: State content correctly Electronic Signature(s) Signed: 05/20/2022 12:58:23 PM By: Sharyn Creamer RN, BSN Entered By: Sharyn Creamer on 05/20/2022 08:46:45 -------------------------------------------------------------------------------- Wound Assessment Details Patient Name: Date of Service: [name], [name] 05/20/2022 8:00 A M Medical Record Number: [id number] Patient Account Number: [id number] Date of Birth/Sex: Treating RN: 24-Oct-1961 (61 y.o. Mare Ferrari Primary Care Keyairra Kolinski: PCP, NO Other Clinician: Referring Luetta Piazza: Treating Aleyssa Pike/Extender: Mayer Camel in Treatment: 0 Wound Status Wound Number: 5 Primary Diabetic Wound/Ulcer of the Lower Extremity Etiology: Wound Location: Left, Medial Lower Leg Wound Open Wounding Event: Gradually Appeared Status: Date Acquired: 05/20/2022 Comorbid Congestive Heart Failure, Coronary Artery Disease, Deep Vein Weeks Of Treatment: 0 History: Thrombosis, Hypertension, Hypotension, Type II Diabetes, Clustered Wound: No Neuropathy Photos Wound Measurements Length: (cm) 4 Width: (cm) 3.5 Depth: (cm) 0.1 Area: (cm) 10.996 Volume: (cm) 1.1 % Reduction in Area: 0% % Reduction in Volume:  0% Epithelialization: None Tunneling: No Undermining: No Wound Description Classification: Grade 2 Exudate Amount: Medium Exudate Type: Serosanguineous Exudate Color: red, brown Foul Odor After Cleansing: No Slough/Fibrino Yes Wound Bed Granulation Amount: Large (67-100%) Exposed Structure Granulation Quality: Pink Fascia Exposed: No Necrotic Amount: Small (1-33%) Fat Layer (Subcutaneous Tissue) Exposed: Yes Tendon Exposed: No Muscle Exposed: No Joint Exposed: No Bone Exposed: No Electronic Signature(s) Signed: 05/20/2022 12:58:23 PM By: Sharyn Creamer RN, BSN Entered By: Sharyn Creamer on 05/20/2022 08:30:10 -------------------------------------------------------------------------------- Wound Assessment Details Patient Name: Date of Service: [name], [name] 05/20/2022 8:00 A M Medical Record Number: [id number] Patient Account Number: [id number] Date of Birth/Sex: Treating RN: 1961-01-11 (61 y.o. Mare Ferrari Primary Care Avyn Aden: PCP, NO Other Clinician: Referring Renley Gutman: Treating Carzell Saldivar/Extender: Mayer Camel in Treatment: 0 Wound Status Wound Number: 6 Primary Diabetic Wound/Ulcer of the Lower Extremity Etiology: Wound Location: Right, Medial Lower Leg Wound Open Wounding Event: Gradually Appeared Status: Date Acquired: 05/20/2022 Comorbid Congestive Heart Failure, Coronary Artery Disease, Deep Vein Weeks Of Treatment: 0 History: Thrombosis, Hypertension, Hypotension, Type II Diabetes, Clustered Wound: No Neuropathy Photos Wound Measurements Length: (cm) 1 Width: (cm) 0.5 Depth: (cm) 0.1 Area: (cm) 0.393 Volume: (cm) 0.039 % Reduction in Area: 0% % Reduction in Volume: 0% Epithelialization: None Tunneling: No Undermining: No Wound Description Classification: Grade 2 Exudate Amount: Medium Exudate Type: Serosanguineous Exudate Color: red, brown Foul Odor After Cleansing: No Slough/Fibrino Yes Wound  Bed Granulation Amount: Large (67-100%) Exposed Structure Granulation Quality: Red Fascia Exposed: No Necrotic Amount: Small (1-33%) Fat Layer (Subcutaneous Tissue) Exposed: Yes Necrotic Quality: Adherent Slough Tendon Exposed: No Muscle Exposed: No Joint Exposed: No Bone Exposed: No Electronic Signature(s) Signed: 05/20/2022 12:58:23 PM By: Sharyn Creamer RN, BSN Entered By: Sharyn Creamer on 05/20/2022 08:30:44 -------------------------------------------------------------------------------- [place] Details Patient Name: Date of Service: [name], [name] 05/20/2022 8:00 A M Medical Record Number: [id number] Patient Account Number: [id number] Date of Birth/Sex: Treating RN: 14-Apr-1961 (61 y.o. Mare Ferrari Primary Care Nikkol Pai: PCP, NO Other Clinician: Referring Alazay Leicht: Treating Tiffanye Hartmann/Extender: Mayer Camel in Treatment: 0 Vital Signs Time Taken: 08:06 Temperature (F): 98.0 Height (in): 70 Pulse (bpm): 81 Source: Stated Respiratory  Rate (breaths/min): 16 Weight (lbs): 320 Blood Pressure (mmHg): 153/90 Source: Stated Capillary Blood Glucose (mg/dl): 165 Body Mass Index (BMI): 45.9 Reference Range: 80 - 120 mg / dl Electronic Signature(s) Signed: 05/20/2022 12:58:23 PM By: Sharyn Creamer RN, BSN Entered By: Sharyn Creamer on 05/20/2022 08:07:27

## 2022-05-27 ENCOUNTER — Encounter (HOSPITAL_BASED_OUTPATIENT_CLINIC_OR_DEPARTMENT_OTHER): Payer: Medicaid Other | Attending: Internal Medicine | Admitting: Internal Medicine

## 2022-05-27 DIAGNOSIS — L97822 Non-pressure chronic ulcer of other part of left lower leg with fat layer exposed: Secondary | ICD-10-CM | POA: Diagnosis not present

## 2022-05-27 DIAGNOSIS — I87313 Chronic venous hypertension (idiopathic) with ulcer of bilateral lower extremity: Secondary | ICD-10-CM

## 2022-05-27 DIAGNOSIS — E11622 Type 2 diabetes mellitus with other skin ulcer: Secondary | ICD-10-CM | POA: Insufficient documentation

## 2022-05-27 DIAGNOSIS — I87333 Chronic venous hypertension (idiopathic) with ulcer and inflammation of bilateral lower extremity: Secondary | ICD-10-CM | POA: Insufficient documentation

## 2022-05-27 DIAGNOSIS — E1142 Type 2 diabetes mellitus with diabetic polyneuropathy: Secondary | ICD-10-CM | POA: Diagnosis not present

## 2022-05-27 DIAGNOSIS — I890 Lymphedema, not elsewhere classified: Secondary | ICD-10-CM | POA: Diagnosis not present

## 2022-05-27 DIAGNOSIS — Z09 Encounter for follow-up examination after completed treatment for conditions other than malignant neoplasm: Secondary | ICD-10-CM | POA: Insufficient documentation

## 2022-05-27 DIAGNOSIS — L97812 Non-pressure chronic ulcer of other part of right lower leg with fat layer exposed: Secondary | ICD-10-CM | POA: Diagnosis not present

## 2022-05-30 ENCOUNTER — Encounter: Payer: Self-pay | Admitting: Podiatry

## 2022-05-30 ENCOUNTER — Ambulatory Visit (INDEPENDENT_AMBULATORY_CARE_PROVIDER_SITE_OTHER): Payer: Medicaid Other | Admitting: Podiatry

## 2022-05-30 DIAGNOSIS — M79674 Pain in right toe(s): Secondary | ICD-10-CM

## 2022-05-30 DIAGNOSIS — M79675 Pain in left toe(s): Secondary | ICD-10-CM | POA: Diagnosis not present

## 2022-05-30 DIAGNOSIS — B351 Tinea unguium: Secondary | ICD-10-CM | POA: Diagnosis not present

## 2022-05-30 DIAGNOSIS — N179 Acute kidney failure, unspecified: Secondary | ICD-10-CM

## 2022-05-30 DIAGNOSIS — E1142 Type 2 diabetes mellitus with diabetic polyneuropathy: Secondary | ICD-10-CM

## 2022-05-30 NOTE — Progress Notes (Signed)
Juan, Stein ([id number]) Visit Report for 05/27/2022 Chief Complaint Document Details Juan Name: Date of Service: Juan, Stein 05/27/2022 9:30 A M Medical Record Number: [id number] Juan Account Number: [id number] Date of Birth/Sex: Treating RN: 16-Dec-1960 (61 y.o. [name] Primary Care Provider: PCP, NO Other Clinician: Referring Provider: Treating Provider/Extender: Mayer Camel in Treatment: 1 Information Obtained from: Juan Chief Complaint 08/16/2021: Bilateral lower extremity scattered blisters. 1 small open wound limited to skin breakdown on the right lower extremity. 08/23/2021; 1 small open wound limited to skin breakdown on the left lower extremity 01/27/2022; Juan with bilateral lower extremity wounds Electronic Signature(s) Signed: 05/27/2022 10:26:23 AM By: [name] DO Entered By: [name] on 05/27/2022 10:03:27 -------------------------------------------------------------------------------- HPI Details Juan Name: Date of Service: [name], [name] 05/27/2022 9:30 A M Medical Record Number: [id number] Juan Account Number: [id number] Date of Birth/Sex: Treating RN: 1961-07-20 (61 y.o. [name] Primary Care Provider: PCP, NO Other Clinician: Referring Provider: Treating Provider/Extender: Mayer Camel in Treatment: 1 History of Present Illness HPI Description: Admission 08/16/2021 [name] is a 61 year old male with a past medical history of CVA, hypertension, uncontrolled type 2 diabetes on insulin complicated by peripheral neuropathy, Chronic venous insufficiency/lymphedema, diastolic CHF, and left lower extremity DVT that presents to clinic today for an open wound to his right lower extremity. He reports an ongoing issue with blistering to his legs and weeping that wax and wane over the past 6 months. He was previously seen in our clinic in June 2022 however  he had closed wounds at that time. He reported that his diuretic was increased then and helped with symptom control. About 2 months ago he developed increased blistering and swelling again. He reports wearing 20/30 compression stockings however it is unclear if he is doing this daily since he reports the stockings are very difficult to put on. He currently denies signs of infection. 10/31; Juan presents for follow-up. He has tolerated the compression wraps well. The right leg wound has healed. He does have a small open wound now present on the left anterior lower extremity limited to skin breakdown. He would like to continue wrapping both legs. He states he cannot afford juxta lite compression and would like to use his compression stockings he currently owns. He denies signs of infection. 11/7; Juan presents for follow-up. He has no issues or complaints today. While in office he scratched With his fingers at his right leg and now has a small open wound. He continues to have a left lower extremity wound. He tolerated the wraps well. He has his compression stockings today. He denies signs of infection. 11/14; Juan presents for follow-up. He has his compression stockings today. He has no issues or complaints. Readmission 01/27/2022 [name] is a 61 year old male with a past medical history of CVA, hypertension, uncontrolled type 2 diabetes on insulin complicated by peripheral neuropathy, chronic venous insufficiency/lymphedema, diastolic CHF and left lower extremity DVT that presents to the clinic for bilateral lower extremity wounds. He states this started less than a week ago after scrubbing his legs in the shower. He has compression stockings that he reports using daily. He has been keeping the wounds covered. He denies signs of infection. 4/13; Juan returns to clinic today for treatment of his chronic venous insufficiency and lymphedema wounds on the left anterior mid tibia and the  right posterior calf. We have been using silver alginate 4/20; Juan presents for follow-up. He  has his compression stockings today. He has no issues or complaints. Admission 05/20/2022 [name] is a 61 year old male with a past medical history of uncontrolled type 2 diabetes on insulin, venous insufficiency/lymphedema and diastolic congestive heart failure that presents to the clinic for a 1 week history scattered open wounds to his lower extremities bilaterally. He states he has been wearing compression stockings. He has been keeping the areas covered. He denies signs of infection. 8/4; Juan presents for follow-up. We have been using silver alginate under 4-layer compression to lower extremities bilaterally. He has tolerated this well. He has no issues or complaints today. He has his compression stockings with him. Electronic Signature(s) Signed: 05/27/2022 10:26:23 AM By: [name] DO Entered By: [name] on 05/27/2022 10:04:34 -------------------------------------------------------------------------------- Physical Exam Details Juan Name: Date of Service: [name], [name] 05/27/2022 9:30 A M Medical Record Number: [id number] Juan Account Number: [id number] Date of Birth/Sex: Treating RN: [birth year] (60 y.o. [name] Primary Care Provider: PCP, NO Other Clinician: Referring Provider: Treating Provider/Extender: Mayer Camel in Treatment: 1 Constitutional respirations regular, non-labored and within target range for Juan.. Cardiovascular 2+ dorsalis pedis/posterior tibialis pulses. Psychiatric pleasant and cooperative. Notes Right lower extremity: Epithelization to the previous wound site. Good edema control. Left lower extremity: Few small scattered open wounds with granulation tissue. Good edema control Electronic Signature(s) Signed: 05/27/2022 10:26:23 AM By: [name] DO Entered By: [name] on  05/27/2022 10:05:32 -------------------------------------------------------------------------------- Physician Orders Details Juan Name: Date of Service: [name], [name] 05/27/2022 9:30 A M Medical Record Number: [id number] Juan Account Number: [id number] Date of Birth/Sex: Treating RN: [birth year] (61 y.o. Janyth Contes Primary Care Provider: PCP, NO Other Clinician: Referring Provider: Treating Provider/Extender: Mayer Camel in Treatment: 1 Verbal / Phone Orders: No Diagnosis Coding Follow-up Appointments ppointment in 1 week. - Dr. Heber [place] with Leveda Anna 8/11 at 9:30 Return A Bathing/ Shower/ Hygiene May shower with protection but do not get wound dressing(s) wet. Edema Control - Lymphedema / SCD / Other Elevate legs to the level of the heart or above for 30 minutes daily and/or when sitting, a frequency of: - throughout the day 3-4 times a day. Avoid standing for long periods of time. Exercise regularly Wound Treatment Wound #5 - Lower Leg Wound Laterality: Left, Medial Cleanser: Soap and Water 1 x Per Week/15 Days Discharge Instructions: May shower and wash wound with dial antibacterial soap and water prior to dressing change. Peri-Wound Care: Triamcinolone 15 (g) 1 x Per Week/15 Days Discharge Instructions: Use triamcinolone 15 (g) as directed Peri-Wound Care: Sween Lotion (Moisturizing lotion) 1 x Per Week/15 Days Discharge Instructions: Apply moisturizing lotion as directed Prim Dressing: KerraCel Ag Gelling Fiber Dressing, 4x5 in (silver alginate) 1 x Per Week/15 Days ary Discharge Instructions: Apply silver alginate to wound bed as instructed Secondary Dressing: Woven Gauze Sponge, Non-Sterile 4x4 in 1 x Per Week/15 Days Discharge Instructions: Apply over primary dressing as directed. Compression Wrap: FourPress (4 layer compression wrap) 1 x Per Week/15 Days Discharge Instructions: Apply four layer compression as directed. May also use  Miliken CoFlex 2 layer compression system as alternative. Electronic Signature(s) Signed: 05/27/2022 10:26:23 AM By: [name] DO Entered By: [name] on 05/27/2022 10:05:41 -------------------------------------------------------------------------------- Problem List Details Juan Name: Date of Service: [name], [name] 05/27/2022 9:30 A M Medical Record Number: [id number] Juan Account Number: [id number] Date of Birth/Sex: Treating RN: [birth year] (61 y.o. [name] Primary Care Provider: PCP, NO Other  Clinician: Referring Provider: Treating Provider/Extender: Mayer Camel in Treatment: 1 Active Problems ICD-10 Encounter Code Description Active Date MDM Diagnosis L97.812 Non-pressure chronic ulcer of other part of right lower leg with fat layer 05/20/2022 No Yes exposed L97.822 Non-pressure chronic ulcer of other part of left lower leg with fat layer exposed7/28/2023 No Yes I89.0 Lymphedema, not elsewhere classified 05/20/2022 No Yes I87.313 Chronic venous hypertension (idiopathic) with ulcer of bilateral lower extremity 05/20/2022 No Yes E11.622 Type 2 diabetes mellitus with other skin ulcer 05/20/2022 No Yes Inactive Problems Resolved Problems Electronic Signature(s) Signed: 05/27/2022 10:26:23 AM By: [name] DO Entered By: [name] on 05/27/2022 10:03:12 -------------------------------------------------------------------------------- Progress Note Details Juan Name: Date of Service: [name], [name] 05/27/2022 9:30 A M Medical Record Number: [id number] Juan Account Number: [id number] Date of Birth/Sex: Treating RN: 06-20-1961 (61 y.o. [name] Primary Care Provider: PCP, NO Other Clinician: Referring Provider: Treating Provider/Extender: Mayer Camel in Treatment: 1 Subjective Chief Complaint Information obtained from Juan 08/16/2021: Bilateral lower extremity  scattered blisters. 1 small open wound limited to skin breakdown on the right lower extremity. 08/23/2021; 1 small open wound limited to skin breakdown on the left lower extremity 01/27/2022; Juan with bilateral lower extremity wounds History of Present Illness (HPI) Admission 08/16/2021 Mr. Archit Leger is a 61 year old male with a past medical history of CVA, hypertension, uncontrolled type 2 diabetes on insulin complicated by peripheral neuropathy, Chronic venous insufficiency/lymphedema, diastolic CHF, and left lower extremity DVT that presents to clinic today for an open wound to his right lower extremity. He reports an ongoing issue with blistering to his legs and weeping that wax and wane over the past 6 months. He was previously seen in our clinic in June 2022 however he had closed wounds at that time. He reported that his diuretic was increased then and helped with symptom control. About 2 months ago he developed increased blistering and swelling again. He reports wearing 20/30 compression stockings however it is unclear if he is doing this daily since he reports the stockings are very difficult to put on. He currently denies signs of infection. 10/31; Juan presents for follow-up. He has tolerated the compression wraps well. The right leg wound has healed. He does have a small open wound now present on the left anterior lower extremity limited to skin breakdown. He would like to continue wrapping both legs. He states he cannot afford juxta lite compression and would like to use his compression stockings he currently owns. He denies signs of infection. 11/7; Juan presents for follow-up. He has no issues or complaints today. While in office he scratched With his fingers at his right leg and now has a small open wound. He continues to have a left lower extremity wound. He tolerated the wraps well. He has his compression stockings today. He denies signs of infection. 11/14; Juan  presents for follow-up. He has his compression stockings today. He has no issues or complaints. Readmission 01/27/2022 Mr. Gaelan Glennon is a 61 year old male with a past medical history of CVA, hypertension, uncontrolled type 2 diabetes on insulin complicated by peripheral neuropathy, chronic venous insufficiency/lymphedema, diastolic CHF and left lower extremity DVT that presents to the clinic for bilateral lower extremity wounds. He states this started less than a week ago after scrubbing his legs in the shower. He has compression stockings that he reports using daily. He has been keeping the wounds covered. He denies signs of infection. 4/13; Juan returns to clinic  today for treatment of his chronic venous insufficiency and lymphedema wounds on the left anterior mid tibia and the right posterior calf. We have been using silver alginate 4/20; Juan presents for follow-up. He has his compression stockings today. He has no issues or complaints. Admission 05/20/2022 [name] is a 61 year old male with a past medical history of uncontrolled type 2 diabetes on insulin, venous insufficiency/lymphedema and diastolic congestive heart failure that presents to the clinic for a 1 week history scattered open wounds to his lower extremities bilaterally. He states he has been wearing compression stockings. He has been keeping the areas covered. He denies signs of infection. 8/4; Juan presents for follow-up. We have been using silver alginate under 4-layer compression to lower extremities bilaterally. He has tolerated this well. He has no issues or complaints today. He has his compression stockings with him. Juan History Unable to Obtain Juan History due to Altered Mental Status. Information obtained from Juan. Family History Cancer - Mother,Father, Diabetes - Mother,Siblings, Hypertension - Siblings,Mother, Seizures - Mother,Siblings, Stroke - Siblings, No family history of Heart  Disease, Hereditary Spherocytosis, Kidney Disease, Lung Disease, Thyroid Problems, Tuberculosis. Social History Never smoker, Marital Status - Divorced, Alcohol Use - Rarely, Drug Use - No History, Caffeine Use - Daily. Medical History Eyes Denies history of Cataracts, Glaucoma, Optic Neuritis Ear/Nose/Mouth/Throat Denies history of Chronic sinus problems/congestion, Middle ear problems Hematologic/Lymphatic Denies history of Anemia, Hemophilia, Human Immunodeficiency Virus, Lymphedema, Sickle Cell Disease Respiratory Denies history of Aspiration, Asthma, Chronic Obstructive Pulmonary Disease (COPD), Pneumothorax, Sleep Apnea, Tuberculosis Cardiovascular Juan has history of Congestive Heart Failure, Coronary Artery Disease, Deep Vein Thrombosis, Hypertension, Hypotension Denies history of Angina, Arrhythmia, Myocardial Infarction, Peripheral Arterial Disease, Peripheral Venous Disease, Phlebitis, Vasculitis Gastrointestinal Denies history of Cirrhosis , Colitis, Crohnoos, Hepatitis A, Hepatitis B, Hepatitis C Endocrine Juan has history of Type II Diabetes Denies history of Type I Diabetes Genitourinary Denies history of End Stage Renal Disease Immunological Denies history of Lupus Erythematosus, Raynaudoos, Scleroderma Integumentary (Skin) Denies history of History of Burn Musculoskeletal Denies history of Gout, Rheumatoid Arthritis, Osteoarthritis, Osteomyelitis Neurologic Juan has history of Neuropathy Denies history of Dementia, Quadriplegia, Paraplegia, Seizure Disorder Oncologic Denies history of Received Chemotherapy, Received Radiation Psychiatric Denies history of Anorexia/bulimia, Confinement Anxiety Hospitalization/Surgery History - inpatient PE 06/2021. - 01/13/2022 low BP. Medical A Surgical History Notes nd Respiratory PE September 2022 02 dependent at night PE to left lung June 2023 Objective Constitutional respirations regular, non-labored and within  target range for Juan.. Vitals Time Taken: 9:29 AM, Height: 70 in, Weight: 320 lbs, BMI: 45.9, Temperature: 98.1 F, Pulse: 86 bpm, Respiratory Rate: 16 breaths/min, Blood Pressure: 157/84 mmHg, Capillary Blood Glucose: 180 mg/dl. Cardiovascular 2+ dorsalis pedis/posterior tibialis pulses. Psychiatric pleasant and cooperative. General Notes: Right lower extremity: Epithelization to the previous wound site. Good edema control. Left lower extremity: Few small scattered open wounds with granulation tissue. Good edema control Integumentary (Hair, Skin) Wound #5 status is Open. Original cause of wound was Gradually Appeared. The date acquired was: 05/20/2022. The wound has been in treatment 1 weeks. The wound is located on the Left,Medial Lower Leg. The wound measures 0.1cm length x 0.5cm width x 0.1cm depth; 0.039cm^2 area and 0.004cm^3 volume. There is Fat Layer (Subcutaneous Tissue) exposed. There is no tunneling or undermining noted. There is a small amount of serosanguineous drainage noted. The wound margin is distinct with the outline attached to the wound base. There is large (67-100%) pink granulation within the wound bed. There is  no necrotic tissue within the wound bed. Wound #6 status is Open. Original cause of wound was Gradually Appeared. The date acquired was: 05/20/2022. The wound has been in treatment 1 weeks. The wound is located on the Right,Medial Lower Leg. The wound measures 0cm length x 0cm width x 0cm depth; 0cm^2 area and 0cm^3 volume. There is no tunneling or undermining noted. There is a none present amount of drainage noted. The wound margin is distinct with the outline attached to the wound base. There is no granulation within the wound bed. There is no necrotic tissue within the wound bed. Assessment Active Problems ICD-10 Non-pressure chronic ulcer of other part of right lower leg with fat layer exposed Non-pressure chronic ulcer of other part of left lower leg with  fat layer exposed Lymphedema, not elsewhere classified Chronic venous hypertension (idiopathic) with ulcer of bilateral lower extremity Type 2 diabetes mellitus with other skin ulcer Juan has done well with silver alginate under 4-layer compression. The wounds on his right lower extremity have healed. I recommended using compression stocking to this daily. He still has a couple open areas on the left lower extremity and I recommended continuing silver alginate under 4-layer compression for the next week. I am hopeful he will be healed by next clinic visit. Plan Follow-up Appointments: Return Appointment in 1 week. - Dr. Heber [place] with Leveda Anna 8/11 at 9:30 Bathing/ Shower/ Hygiene: May shower with protection but do not get wound dressing(s) wet. Edema Control - Lymphedema / SCD / Other: Elevate legs to the level of the heart or above for 30 minutes daily and/or when sitting, a frequency of: - throughout the day 3-4 times a day. Avoid standing for long periods of time. Exercise regularly WOUND #5: - Lower Leg Wound Laterality: Left, Medial Cleanser: Soap and Water 1 x Per Week/15 Days Discharge Instructions: May shower and wash wound with dial antibacterial soap and water prior to dressing change. Peri-Wound Care: Triamcinolone 15 (g) 1 x Per Week/15 Days Discharge Instructions: Use triamcinolone 15 (g) as directed Peri-Wound Care: Sween Lotion (Moisturizing lotion) 1 x Per Week/15 Days Discharge Instructions: Apply moisturizing lotion as directed Prim Dressing: KerraCel Ag Gelling Fiber Dressing, 4x5 in (silver alginate) 1 x Per Week/15 Days ary Discharge Instructions: Apply silver alginate to wound bed as instructed Secondary Dressing: Woven Gauze Sponge, Non-Sterile 4x4 in 1 x Per Week/15 Days Discharge Instructions: Apply over primary dressing as directed. Com pression Wrap: FourPress (4 layer compression wrap) 1 x Per Week/15 Days Discharge Instructions: Apply four layer compression as  directed. May also use Miliken CoFlex 2 layer compression system as alternative. 1. Silver alginate under 4-layer compressionooleft leg 2. Right lower extremity compression stocking daily 3. Follow-up in 1 week Electronic Signature(s) Signed: 05/27/2022 10:26:23 AM By: [name] DO Entered By: [name] on 05/27/2022 10:06:37 -------------------------------------------------------------------------------- HxROS Details Juan Name: Date of Service: [name], [name] 05/27/2022 9:30 A M Medical Record Number: [id number] Juan Account Number: [id number] Date of Birth/Sex: Treating RN: [birth year] (61 y.o. [name] Primary Care Provider: PCP, NO Other Clinician: Referring Provider: Treating Provider/Extender: Mayer Camel in Treatment: 1 Unable to Obtain Juan History due to Altered Mental Status Information Obtained From Juan Eyes Medical History: Negative for: Cataracts; Glaucoma; Optic Neuritis Ear/Nose/Mouth/Throat Medical History: Negative for: Chronic sinus problems/congestion; Middle ear problems Hematologic/Lymphatic Medical History: Negative for: Anemia; Hemophilia; Human Immunodeficiency Virus; Lymphedema; Sickle Cell Disease Respiratory Medical History: Negative for: Aspiration; Asthma; Chronic Obstructive Pulmonary Disease (COPD); Pneumothorax; Sleep  Apnea; Tuberculosis Past Medical History Notes: PE September 2022 02 dependent at night PE to left lung June 2023 Cardiovascular Medical History: Positive for: Congestive Heart Failure; Coronary Artery Disease; Deep Vein Thrombosis; Hypertension; Hypotension Negative for: Angina; Arrhythmia; Myocardial Infarction; Peripheral Arterial Disease; Peripheral Venous Disease; Phlebitis; Vasculitis Gastrointestinal Medical History: Negative for: Cirrhosis ; Colitis; Crohns; Hepatitis A; Hepatitis B; Hepatitis C Endocrine Medical History: Positive for: Type II  Diabetes Negative for: Type I Diabetes Time with diabetes: 11 years Treated with: Insulin, Oral agents Blood sugar tested every day: Yes Tested : TID Blood sugar testing results: Breakfast: 200 Genitourinary Medical History: Negative for: End Stage Renal Disease Immunological Medical History: Negative for: Lupus Erythematosus; Raynauds; Scleroderma Integumentary (Skin) Medical History: Negative for: History of Burn Musculoskeletal Medical History: Negative for: Gout; Rheumatoid Arthritis; Osteoarthritis; Osteomyelitis Neurologic Medical History: Positive for: Neuropathy Negative for: Dementia; Quadriplegia; Paraplegia; Seizure Disorder Oncologic Medical History: Negative for: Received Chemotherapy; Received Radiation Psychiatric Medical History: Negative for: Anorexia/bulimia; Confinement Anxiety Immunizations Pneumococcal Vaccine: Received Pneumococcal Vaccination: No Implantable Devices None Hospitalization / Surgery History Type of Hospitalization/Surgery inpatient PE 06/2021 01/13/2022 low BP Family and Social History Cancer: Yes - Mother,Father; Diabetes: Yes - Mother,Siblings; Heart Disease: No; Hereditary Spherocytosis: No; Hypertension: Yes - Siblings,Mother; Kidney Disease: No; Lung Disease: No; Seizures: Yes - Mother,Siblings; Stroke: Yes - Siblings; Thyroid Problems: No; Tuberculosis: No; Never smoker; Marital Status - Divorced; Alcohol Use: Rarely; Drug Use: No History; Caffeine Use: Daily; Financial Concerns: No; Food, Clothing or Shelter Needs: No; Support System Lacking: No; Transportation Concerns: No Electronic Signature(s) Signed: 05/27/2022 10:26:23 AM By: [name] DO Signed: 05/30/2022 4:32:44 PM By: Lorrin Jackson Entered By: [name] on 05/27/2022 10:04:39 -------------------------------------------------------------------------------- SuperBill Details Juan Name: Date of Service: [name], JAMYRON REDD 05/27/2022 Medical Record  Number: [id number] Juan Account Number: [id number] Date of Birth/Sex: Treating RN: [birth year] (61 y.o. [name] Primary Care Provider: PCP, NO Other Clinician: Referring Provider: Treating Provider/Extender: Mayer Camel in Treatment: 1 Diagnosis Coding ICD-10 Codes Code Description [phone] Non-pressure chronic ulcer of other part of right lower leg with fat layer exposed L97.822 Non-pressure chronic ulcer of other part of left lower leg with fat layer exposed I89.0 Lymphedema, not elsewhere classified I87.313 Chronic venous hypertension (idiopathic) with ulcer of bilateral lower extremity E11.622 Type 2 diabetes mellitus with other skin ulcer Physician Procedures : CPT4 Code Description Modifier [id number] - WC PHYS LEVEL 3 - EST PT ICD-10 Diagnosis Description L97.812 Non-pressure chronic ulcer of other part of right lower leg with fat layer exposed L97.822 Non-pressure chronic ulcer of other part of left  lower leg with fat layer exposed I87.313 Chronic venous hypertension (idiopathic) with ulcer of bilateral lower extremity E11.622 Type 2 diabetes mellitus with other skin ulcer Quantity: 1 Electronic Signature(s) Signed: 05/27/2022 10:26:23 AM By: [name] DO Entered By: [name] on 05/27/2022 10:07:00

## 2022-05-30 NOTE — Progress Notes (Signed)
[name] ([id number]) Visit Report for 05/27/2022 Arrival Information Details Patient Name: Date of Service: [name], [name] 05/27/2022 9:30 A M Medical Record Number: [id number] Patient Account Number: [id number] Date of Birth/Sex: Treating RN: [birth year] (61 y.o. Janyth Contes Primary Care Esta Carmon: PCP, NO Other Clinician: Referring Dao Memmott: Treating Dariane Natzke/Extender: Mayer Camel in Treatment: 1 Visit Information History Since Last Visit Added or deleted any medications: No Patient Arrived: Ambulatory Any new allergies or adverse reactions: No Arrival Time: 09:26 Had a fall or experienced change in No Accompanied By: self activities of daily living that may affect Transfer Assistance: None risk of falls: Patient Requires Transmission-Based No Signs or symptoms of abuse/neglect since last visito No Precautions: Hospitalized since last visit: No Patient Has Alerts: Yes Implantable device outside of the clinic excluding No Patient Alerts: Patient on Blood Thinner cellular tissue based products placed in the center Eliquis since last visit: 09/06/21 ABI L1.01 office Has Dressing in Place as Prescribed: Yes 09/06/21 ABI R1.02 office Has Compression in Place as Prescribed: Yes Pain Present Now: Yes Electronic Signature(s) Signed: 05/27/2022 12:51:55 PM By: Adline Peals Entered By: Adline Peals on 05/27/2022 09:28:55 -------------------------------------------------------------------------------- Clinic Level of Care Assessment Details Patient Name: Date of Service: [name] 05/27/2022 9:30 A M Medical Record Number: [id number] Patient Account Number: [id number] Date of Birth/Sex: Treating RN: [birth year] (61 y.o. Janyth Contes Primary Care Dalary Hollar: PCP, NO Other Clinician: Referring Ryla Cauthon: Treating Cai Flott/Extender: Mayer Camel in Treatment: 1 Clinic Level of Care  Assessment Items TOOL 4 Quantity Score X- 1 0 Use when only an EandM is performed on FOLLOW-UP visit ASSESSMENTS - Nursing Assessment / Reassessment X- 1 10 Reassessment of Co-morbidities (includes updates in patient status) X- 1 5 Reassessment of Adherence to Treatment Plan ASSESSMENTS - Wound and Skin A ssessment / Reassessment X - Simple Wound Assessment / Reassessment - one wound 1 5 '[]'$  - 0 Complex Wound Assessment / Reassessment - multiple wounds '[]'$  - 0 Dermatologic / Skin Assessment (not related to wound area) ASSESSMENTS - Focused Assessment X- 1 5 Circumferential Edema Measurements - multi extremities '[]'$  - 0 Nutritional Assessment / Counseling / Intervention X- 1 5 Lower Extremity Assessment (monofilament, tuning fork, pulses) '[]'$  - 0 Peripheral Arterial Disease Assessment (using hand held doppler) ASSESSMENTS - Ostomy and/or Continence Assessment and Care '[]'$  - 0 Incontinence Assessment and Management '[]'$  - 0 Ostomy Care Assessment and Management (repouching, etc.) PROCESS - Coordination of Care X - Simple Patient / Family Education for ongoing care 1 15 '[]'$  - 0 Complex (extensive) Patient / Family Education for ongoing care X- 1 10 Staff obtains Programmer, systems, Records, T Results / Process Orders est '[]'$  - 0 Staff telephones HHA, Nursing Homes / Clarify orders / etc '[]'$  - 0 Routine Transfer to another Facility (non-emergent condition) '[]'$  - 0 Routine Hospital Admission (non-emergent condition) '[]'$  - 0 New Admissions / Biomedical engineer / Ordering NPWT Apligraf, etc. , '[]'$  - 0 Emergency Hospital Admission (emergent condition) X- 1 10 Simple Discharge Coordination '[]'$  - 0 Complex (extensive) Discharge Coordination PROCESS - Special Needs '[]'$  - 0 Pediatric / Minor Patient Management '[]'$  - 0 Isolation Patient Management '[]'$  - 0 Hearing / Language / Visual special needs '[]'$  - 0 Assessment of Community assistance (transportation, D/C planning, etc.) '[]'$  -  0 Additional assistance / Altered mentation '[]'$  - 0 Support Surface(s) Assessment (bed, cushion, seat, etc.) INTERVENTIONS - Wound Cleansing / Measurement X - Simple Wound Cleansing -  one wound 1 5 '[]'$  - 0 Complex Wound Cleansing - multiple wounds X- 1 5 Wound Imaging (photographs - any number of wounds) '[]'$  - 0 Wound Tracing (instead of photographs) X- 1 5 Simple Wound Measurement - one wound '[]'$  - 0 Complex Wound Measurement - multiple wounds INTERVENTIONS - Wound Dressings '[]'$  - 0 Small Wound Dressing one or multiple wounds '[]'$  - 0 Medium Wound Dressing one or multiple wounds X- 1 20 Large Wound Dressing one or multiple wounds '[]'$  - 0 Application of Medications - topical '[]'$  - 0 Application of Medications - injection INTERVENTIONS - Miscellaneous '[]'$  - 0 External ear exam '[]'$  - 0 Specimen Collection (cultures, biopsies, blood, body fluids, etc.) '[]'$  - 0 Specimen(s) / Culture(s) sent or taken to Lab for analysis '[]'$  - 0 Patient Transfer (multiple staff / Civil Service fast streamer / Similar devices) '[]'$  - 0 Simple Staple / Suture removal (25 or less) '[]'$  - 0 Complex Staple / Suture removal (26 or more) '[]'$  - 0 Hypo / Hyperglycemic Management (close monitor of Blood Glucose) '[]'$  - 0 Ankle / Brachial Index (ABI) - do not check if billed separately X- 1 5 Vital Signs Has the patient been seen at the hospital within the last three years: Yes Total Score: 105 Level Of Care: New/Established - Level 3 Electronic Signature(s) Signed: 05/27/2022 12:51:55 PM By: Adline Peals Entered By: Adline Peals on 05/27/2022 10:06:43 -------------------------------------------------------------------------------- Encounter Discharge Information Details Patient Name: Date of Service: Juan Lunch, [name] 05/27/2022 9:30 A M Medical Record Number: [id number] Patient Account Number: [id number] Date of Birth/Sex: Treating RN: May 08, 1961 (61 y.o. Janyth Contes Primary Care Daeja Helderman: PCP, NO Other  Clinician: Referring Pierce Barocio: Treating Ole Lafon/Extender: Mayer Camel in Treatment: 1 Encounter Discharge Information Items Discharge Condition: Stable Ambulatory Status: Ambulatory Discharge Destination: Home Transportation: Private Auto Accompanied By: self Schedule Follow-up Appointment: Yes Clinical Summary of Care: Patient Declined Electronic Signature(s) Signed: 05/27/2022 12:51:55 PM By: Adline Peals Entered By: Adline Peals on 05/27/2022 10:07:13 -------------------------------------------------------------------------------- Lower Extremity Assessment Details Patient Name: Date of Service: RAFEAL, SKIBICKI 05/27/2022 9:30 A M Medical Record Number: [id number] Patient Account Number: [id number] Date of Birth/Sex: Treating RN: 10-29-1960 (61 y.o. Janyth Contes Primary Care Wilkie Zenon: PCP, NO Other Clinician: Referring Lenise Jr: Treating Cambree Hendrix/Extender: Mayer Camel in Treatment: 1 Edema Assessment Assessed: Shirlyn Goltz: No] [Right: No] Edema: [Left: Yes] [Right: Yes] Calf Left: Right: Point of Measurement: 33 cm From Medial Instep 48.5 cm 46.1 cm Ankle Left: Right: Point of Measurement: 13 cm From Medial Instep 27.5 cm 26.4 cm Vascular Assessment Pulses: Dorsalis Pedis Palpable: [Left:Yes] [Right:Yes] Electronic Signature(s) Signed: 05/27/2022 12:51:55 PM By: Adline Peals Entered By: Adline Peals on 05/27/2022 09:34:49 -------------------------------------------------------------------------------- Multi Wound Chart Details Patient Name: Date of Service: Juan Lunch, Manu L. 05/27/2022 9:30 A M Medical Record Number: [id number] Patient Account Number: [id number] Date of Birth/Sex: Treating RN: 07-22-1961 (61 y.o. [name] Primary Care Joab Carden: PCP, NO Other Clinician: Referring Titianna Loomis: Treating Lavan Imes/Extender: Mayer Camel in Treatment: 1 Vital  Signs Height(in): 70 Capillary Blood Glucose(mg/dl): 180 Weight(lbs): 320 Pulse(bpm): 93 Body Mass Index(BMI): 45.9 Blood Pressure(mmHg): 157/84 Temperature(F): 98.1 Respiratory Rate(breaths/min): 16 Photos: [5:No Photos Left, Medial Lower Leg] [6:No Photos Right, Medial Lower Leg] [N/A:N/A N/A] Wound Location: [5:Gradually Appeared] [6:Gradually Appeared] [N/A:N/A] Wounding Event: [5:Venous Leg Ulcer] [6:Venous Leg Ulcer] [N/A:N/A] Primary Etiology: [5:Congestive Heart Failure, Coronary Congestive Heart Failure, Coronary] [N/A:N/A] Comorbid History: [5:Artery Disease, Deep Vein Thrombosis, Hypertension, Hypotension, Type II Diabetes, Neuropathy  05/20/2022] [6:Artery Disease, Deep Vein Thrombosis, Hypertension, Hypotension, Type II Diabetes, Neuropathy 05/20/2022] [N/A:N/A] Date Acquired: [5:1] [6:1] [N/A:N/A] Weeks of Treatment: [5:Open] [6:Open] [N/A:N/A] Wound Status: [5:No] [6:No] [N/A:N/A] Wound Recurrence: [5:0.1x0.5x0.1] [6:0x0x0] [N/A:N/A] Measurements L x W x D (cm) [5:0.039] [6:0] [N/A:N/A] A (cm) : rea [5:0.004] [6:0] [N/A:N/A] Volume (cm) : [5:99.60%] [6:100.00%] [N/A:N/A] % Reduction in Area: [5:99.60%] [6:100.00%] [N/A:N/A] % Reduction in Volume: [5:Full Thickness Without Exposed] [6:Full Thickness Without Exposed] [N/A:N/A] Classification: [5:Support Structures Small] [6:Support Structures None Present] [N/A:N/A] Exudate Amount: [5:Serosanguineous] [6:N/A] [N/A:N/A] Exudate Type: [5:red, brown] [6:N/A] [N/A:N/A] Exudate Color: [5:Distinct, outline attached] [6:Distinct, outline attached] [N/A:N/A] Wound Margin: [5:Large (67-100%)] [6:None Present (0%)] [N/A:N/A] Granulation Amount: [5:Pink] [6:N/A] [N/A:N/A] Granulation Quality: [5:None Present (0%)] [6:None Present (0%)] [N/A:N/A] Necrotic Amount: [5:Fat Layer (Subcutaneous Tissue): Yes Fascia: No] [N/A:N/A] Exposed Structures: [5:Fascia: No Tendon: No Muscle: No Joint: No Bone: No Large (67-100%)] [6:Fat Layer  (Subcutaneous Tissue): No Tendon: No Muscle: No Joint: No Bone: No Large (67-100%)] [N/A:N/A] Treatment Notes Electronic Signature(s) Signed: 05/27/2022 10:26:23 AM By: [name] DO Signed: 05/30/2022 4:32:44 PM By: Lorrin Jackson Entered By: [name] on 05/27/2022 10:03:21 -------------------------------------------------------------------------------- Multi-Disciplinary Care Plan Details Patient Name: Date of Service: Juan Lunch, Kyvon L. 05/27/2022 9:30 A M Medical Record Number: [id number] Patient Account Number: [id number] Date of Birth/Sex: Treating RN: [birth year] (61 y.o. Janyth Contes Primary Care Ritchard Paragas: PCP, NO Other Clinician: Referring Susie Ehresman: Treating Ardith Lewman/Extender: Mayer Camel in Treatment: 1 Active Inactive Venous Leg Ulcer Nursing Diagnoses: Knowledge deficit related to disease process and management Goals: Patient will maintain optimal edema control Date Initiated: 05/20/2022 Target Resolution Date: 06/17/2022 Goal Status: Active Patient/caregiver will verbalize understanding of disease process and disease management Date Initiated: 05/20/2022 Target Resolution Date: 06/17/2022 Goal Status: Active Interventions: Assess peripheral edema status every visit. Compression as ordered Provide education on venous insufficiency Notes: Wound/Skin Impairment Nursing Diagnoses: Impaired tissue integrity Knowledge deficit related to ulceration/compromised skin integrity Goals: Patient/caregiver will verbalize understanding of skin care regimen Date Initiated: 05/20/2022 Target Resolution Date: 06/17/2022 Goal Status: Active Ulcer/skin breakdown will have a volume reduction of 30% by week 4 Date Initiated: 05/20/2022 Target Resolution Date: 06/17/2022 Goal Status: Active Interventions: Assess patient/caregiver ability to obtain necessary supplies Assess patient/caregiver ability to perform ulcer/skin care regimen upon  admission and as needed Assess ulceration(s) every visit Provide education on ulcer and skin care Notes: Electronic Signature(s) Signed: 05/27/2022 12:51:55 PM By: Adline Peals Entered By: Adline Peals on 05/27/2022 09:39:20 -------------------------------------------------------------------------------- Pain Assessment Details Patient Name: Date of Service: Juan Lunch, AVEON COLQUHOUN 05/27/2022 9:30 A M Medical Record Number: [id number] Patient Account Number: [id number] Date of Birth/Sex: Treating RN: [birth year] (61 y.o. Janyth Contes Primary Care Domnic Vantol: PCP, NO Other Clinician: Referring Brandalynn Ofallon: Treating Jarek Longton/Extender: Mayer Camel in Treatment: 1 Active Problems Location of Pain Severity and Description of Pain Patient Has Paino Yes Site Locations Pain Location: Generalized Pain Duration of the Pain. Constant / Intermittento Intermittent Rate the pain. Current Pain Level: 8 Character of Pain Describe the Pain: Aching Pain Management and Medication Current Pain Management: Medication: Yes Electronic Signature(s) Signed: 05/27/2022 12:51:55 PM By: Adline Peals Entered By: Adline Peals on 05/27/2022 09:29:17 -------------------------------------------------------------------------------- Patient/Caregiver Education Details Patient Name: Date of Service: Juan Lunch, [name] 8/4/2023andnbsp9:30 [place] Record Number: [id number] Patient Account Number: [id number] Date of Birth/Gender: Treating RN: May 29, 1961 (61 y.o. Janyth Contes Primary Care Physician: PCP, NO Other Clinician: Referring Physician: Treating Physician/Extender: Mayer Camel in Treatment:  1 Education Assessment Education Provided To: Patient Education Topics Provided Wound/Skin Impairment: Methods: Explain/Verbal Responses: Reinforcements needed, State content correctly Electronic Signature(s) Signed:  05/27/2022 12:51:55 PM By: Adline Peals Entered By: Adline Peals on 05/27/2022 09:39:32 -------------------------------------------------------------------------------- Wound Assessment Details Patient Name: Date of Service: Juan Lunch, Amando L. 05/27/2022 9:30 A M Medical Record Number: [id number] Patient Account Number: [id number] Date of Birth/Sex: Treating RN: September 25, 1961 (61 y.o. Janyth Contes Primary Care Ash Mcelwain: PCP, NO Other Clinician: Referring Taylia Berber: Treating Tarika Mckethan/Extender: Mayer Camel in Treatment: 1 Wound Status Wound Number: 5 Primary Venous Leg Ulcer Etiology: Wound Location: Left, Medial Lower Leg Wound Open Wounding Event: Gradually Appeared Status: Date Acquired: 05/20/2022 Comorbid Congestive Heart Failure, Coronary Artery Disease, Deep Vein Weeks Of Treatment: 1 History: Thrombosis, Hypertension, Hypotension, Type II Diabetes, Clustered Wound: No Neuropathy Wound Measurements Length: (cm) 0.1 Width: (cm) 0.5 Depth: (cm) 0.1 Area: (cm) 0.039 Volume: (cm) 0.004 % Reduction in Area: 99.6% % Reduction in Volume: 99.6% Epithelialization: Large (67-100%) Tunneling: No Undermining: No Wound Description Classification: Full Thickness Without Exposed Support Structures Wound Margin: Distinct, outline attached Exudate Amount: Small Exudate Type: Serosanguineous Exudate Color: red, brown Foul Odor After Cleansing: No Slough/Fibrino No Wound Bed Granulation Amount: Large (67-100%) Exposed Structure Granulation Quality: Pink Fascia Exposed: No Necrotic Amount: None Present (0%) Fat Layer (Subcutaneous Tissue) Exposed: Yes Tendon Exposed: No Muscle Exposed: No Joint Exposed: No Bone Exposed: No Treatment Notes Wound #5 (Lower Leg) Wound Laterality: Left, Medial Cleanser Soap and Water Discharge Instruction: May shower and wash wound with dial antibacterial soap and water prior to dressing  change. Peri-Wound Care Triamcinolone 15 (g) Discharge Instruction: Use triamcinolone 15 (g) as directed Sween Lotion (Moisturizing lotion) Discharge Instruction: Apply moisturizing lotion as directed Topical Primary Dressing KerraCel Ag Gelling Fiber Dressing, 4x5 in (silver alginate) Discharge Instruction: Apply silver alginate to wound bed as instructed Secondary Dressing Woven Gauze Sponge, Non-Sterile 4x4 in Discharge Instruction: Apply over primary dressing as directed. Secured With Compression Wrap FourPress (4 layer compression wrap) Discharge Instruction: Apply four layer compression as directed. May also use Miliken CoFlex 2 layer compression system as alternative. Compression Stockings Add-Ons Electronic Signature(s) Signed: 05/27/2022 12:51:55 PM By: Adline Peals Entered By: Adline Peals on 05/27/2022 09:35:30 -------------------------------------------------------------------------------- Wound Assessment Details Patient Name: Date of Service: MAAZ, SPIERING 05/27/2022 9:30 A M Medical Record Number: [id number] Patient Account Number: [id number] Date of Birth/Sex: Treating RN: 27-May-1961 (61 y.o. Janyth Contes Primary Care Bradi Arbuthnot: PCP, NO Other Clinician: Referring Quina Wilbourne: Treating Leotis Isham/Extender: Mayer Camel in Treatment: 1 Wound Status Wound Number: 6 Primary Venous Leg Ulcer Etiology: Wound Location: Right, Medial Lower Leg Wound Open Wounding Event: Gradually Appeared Status: Date Acquired: 05/20/2022 Comorbid Congestive Heart Failure, Coronary Artery Disease, Deep Vein Weeks Of Treatment: 1 History: Thrombosis, Hypertension, Hypotension, Type II Diabetes, Clustered Wound: No Neuropathy Wound Measurements Length: (cm) Width: (cm) Depth: (cm) Area: (cm) Volume: (cm) 0 % Reduction in Area: 100% 0 % Reduction in Volume: 100% 0 Epithelialization: Large (67-100%) 0 Tunneling: No 0 Undermining:  No Wound Description Classification: Full Thickness Without Exposed Support Structures Wound Margin: Distinct, outline attached Exudate Amount: None Present Foul Odor After Cleansing: No Slough/Fibrino No Wound Bed Granulation Amount: None Present (0%) Exposed Structure Necrotic Amount: None Present (0%) Fascia Exposed: No Fat Layer (Subcutaneous Tissue) Exposed: No Tendon Exposed: No Muscle Exposed: No Joint Exposed: No Bone Exposed: No Electronic Signature(s) Signed: 05/27/2022 12:51:55 PM By: Adline Peals Entered By: Adline Peals on 05/27/2022  09:35:42 -------------------------------------------------------------------------------- Vitals Details Patient Name: Date of Service: NIKALAS, BRAMEL 05/27/2022 9:30 A M Medical Record Number: [id number] Patient Account Number: [id number] Date of Birth/Sex: Treating RN: [birth year] (61 y.o. Janyth Contes Primary Care Modestine Scherzinger: PCP, NO Other Clinician: Referring Alfonzo Arca: Treating Rosalba Totty/Extender: Mayer Camel in Treatment: 1 Vital Signs Time Taken: 09:29 Temperature (F): 98.1 Height (in): 70 Pulse (bpm): 86 Weight (lbs): 320 Respiratory Rate (breaths/min): 16 Body Mass Index (BMI): 45.9 Blood Pressure (mmHg): 157/84 Capillary Blood Glucose (mg/dl): 180 Reference Range: 80 - 120 mg / dl Electronic Signature(s) Signed: 05/27/2022 12:51:55 PM By: Adline Peals Entered By: Adline Peals on 05/27/2022 09:30:27

## 2022-05-30 NOTE — Progress Notes (Signed)
This patient presents  to my office for at risk foot care.  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 legs.This patient is unable to cut nails himself since the patient cannot reach his nails.These nails are painful walking and wearing shoes.  This patient presents for at risk foot care today.  General Appearance  Alert, conversant and in no acute stress.  Vascular  Dorsalis pedis   pulses are absent bilaterally. Posterior tibial pulses are absent due to swelling. Capillary return is within normal limits  bilaterally. Temperature is within normal limits  bilaterally.  Venous stasis  B/L.  Neurologic  Senn-Weinstein monofilament wire test within normal limits  bilaterally. Muscle power within normal limits bilaterally.  Nails Thick disfigured discolored nails with subungual debris  from hallux to fifth toes bilaterally. No evidence of bacterial infection or drainage bilaterally.  Orthopedic  No limitations of motion  feet .  No crepitus or effusions noted.  No bony pathology or digital deformities noted.  Skin  dry skin with no porokeratosis noted bilaterally.  No signs of infections or ulcers noted.     Onychomycosis  Pain in right toes  Pain in left toes  Consent was obtained for treatment procedures.   Mechanical debridement of nails 1-5  bilaterally performed with a nail nipper.  Filed with dremel without incident.    Return office visit    3 months                 Told patient to return for periodic foot care and evaluation due to potential at risk complications.   Gradie Ohm DPM  

## 2022-06-03 ENCOUNTER — Encounter (HOSPITAL_BASED_OUTPATIENT_CLINIC_OR_DEPARTMENT_OTHER): Payer: Medicaid Other | Admitting: Internal Medicine

## 2022-06-03 DIAGNOSIS — L97812 Non-pressure chronic ulcer of other part of right lower leg with fat layer exposed: Secondary | ICD-10-CM

## 2022-06-03 DIAGNOSIS — I890 Lymphedema, not elsewhere classified: Secondary | ICD-10-CM

## 2022-06-03 DIAGNOSIS — L97822 Non-pressure chronic ulcer of other part of left lower leg with fat layer exposed: Secondary | ICD-10-CM | POA: Diagnosis not present

## 2022-06-03 DIAGNOSIS — Z09 Encounter for follow-up examination after completed treatment for conditions other than malignant neoplasm: Secondary | ICD-10-CM | POA: Diagnosis not present

## 2022-06-03 DIAGNOSIS — I87313 Chronic venous hypertension (idiopathic) with ulcer of bilateral lower extremity: Secondary | ICD-10-CM | POA: Diagnosis not present

## 2022-06-03 NOTE — Progress Notes (Signed)
[name], FAZZINO ([id number]) Visit Report for 06/03/2022 Chief Complaint Document Details Patient Name: Date of Service: [name], [name] 06/03/2022 9:30 A M Medical Record Number: [id number] Patient Account Number: [id number] Date of Birth/Sex: Treating RN: 1961/09/16 (61 y.o. [name] Primary Care Provider: PCP, NO Other Clinician: Referring Provider: Treating Provider/Extender: Mayer Camel in Treatment: 2 Information Obtained from: Patient Chief Complaint 08/16/2021: Bilateral lower extremity scattered blisters. 1 small open wound limited to skin breakdown on the right lower extremity. 08/23/2021; 1 small open wound limited to skin breakdown on the left lower extremity 01/27/2022; patient with bilateral lower extremity wounds Electronic Signature(s) Signed: 06/03/2022 1:41:40 PM By: [name] DO Entered By: [name] on 06/03/2022 12:10:57 -------------------------------------------------------------------------------- HPI Details Patient Name: Date of Service: [name], [name] 06/03/2022 9:30 A M Medical Record Number: [id number] Patient Account Number: [id number] Date of Birth/Sex: Treating RN: [birth year] (61 y.o. [name] Primary Care Provider: PCP, NO Other Clinician: Referring Provider: Treating Provider/Extender: Mayer Camel in Treatment: 2 History of Present Illness HPI Description: Admission 08/16/2021 Mr. Chanson Teems is a 60 year old male with a past medical history of CVA, hypertension, uncontrolled type 2 diabetes on insulin complicated by peripheral neuropathy, Chronic venous insufficiency/lymphedema, diastolic CHF, and left lower extremity DVT that presents to clinic today for an open wound to his right lower extremity. He reports an ongoing issue with blistering to his legs and weeping that wax and wane over the past 6 months. He was previously seen in our clinic in June 2022  however he had closed wounds at that time. He reported that his diuretic was increased then and helped with symptom control. About 2 months ago he developed increased blistering and swelling again. He reports wearing 20/30 compression stockings however it is unclear if he is doing this daily since he reports the stockings are very difficult to put on. He currently denies signs of infection. 10/31; patient presents for follow-up. He has tolerated the compression wraps well. The right leg wound has healed. He does have a small open wound now present on the left anterior lower extremity limited to skin breakdown. He would like to continue wrapping both legs. He states he cannot afford juxta lite compression and would like to use his compression stockings he currently owns. He denies signs of infection. 11/7; patient presents for follow-up. He has no issues or complaints today. While in office he scratched With his fingers at his right leg and now has a small open wound. He continues to have a left lower extremity wound. He tolerated the wraps well. He has his compression stockings today. He denies signs of infection. 11/14; patient presents for follow-up. He has his compression stockings today. He has no issues or complaints. Readmission 01/27/2022 Mr. Fed Ceci is a 61 year old male with a past medical history of CVA, hypertension, uncontrolled type 2 diabetes on insulin complicated by peripheral neuropathy, chronic venous insufficiency/lymphedema, diastolic CHF and left lower extremity DVT that presents to the clinic for bilateral lower extremity wounds. He states this started less than a week ago after scrubbing his legs in the shower. He has compression stockings that he reports using daily. He has been keeping the wounds covered. He denies signs of infection. 4/13; patient returns to clinic today for treatment of his chronic venous insufficiency and lymphedema wounds on the left anterior mid tibia  and the right posterior calf. We have been using silver alginate 4/20; patient presents for follow-up. He  has his compression stockings today. He has no issues or complaints. Admission 05/20/2022 Mr. Thorvald Orsino is a 61 year old male with a past medical history of uncontrolled type 2 diabetes on insulin, venous insufficiency/lymphedema and diastolic congestive heart failure that presents to the clinic for a 1 week history scattered open wounds to his lower extremities bilaterally. He states he has been wearing compression stockings. He has been keeping the areas covered. He denies signs of infection. 8/4; patient presents for follow-up. We have been using silver alginate under 4-layer compression to lower extremities bilaterally. He has tolerated this well. He has no issues or complaints today. He has his compression stockings with him. 8/11; Patient presents for follow-up. We have been using silver alginate under 4-layer compression. He has no issues or complaints today. He has his compression stockings with him. Electronic Signature(s) Signed: 06/03/2022 1:41:40 PM By: [name] DO Entered By: [name] on 06/03/2022 12:14:58 -------------------------------------------------------------------------------- Physical Exam Details Patient Name: Date of Service: [name] 06/03/2022 9:30 A M Medical Record Number: [id number] Patient Account Number: [id number] Date of Birth/Sex: Treating RN: 1961/06/20 (61 y.o. [name] Primary Care Provider: PCP, NO Other Clinician: Referring Provider: Treating Provider/Extender: Mayer Camel in Treatment: 2 Constitutional respirations regular, non-labored and within target range for patient.. Cardiovascular 2+ dorsalis pedis/posterior tibialis pulses. Psychiatric pleasant and cooperative. Notes Left lower extremity: Epithelization to the previous wound site. Good edema control. Electronic  Signature(s) Signed: 06/03/2022 1:41:40 PM By: [name] DO Entered By: [name] on 06/03/2022 12:15:52 -------------------------------------------------------------------------------- Physician Orders Details Patient Name: Date of Service: [name], Gussie L. 06/03/2022 9:30 A M Medical Record Number: [id number] Patient Account Number: [id number] Date of Birth/Sex: Treating RN: 1960-11-12 (61 y.o. [name] Primary Care Provider: PCP, NO Other Clinician: Referring Provider: Treating Provider/Extender: Mayer Camel in Treatment: 2 Verbal / Phone Orders: No Diagnosis Coding ICD-10 Coding Code Description [phone] Non-pressure chronic ulcer of other part of right lower leg with fat layer exposed L97.822 Non-pressure chronic ulcer of other part of left lower leg with fat layer exposed I89.0 Lymphedema, not elsewhere classified I87.313 Chronic venous hypertension (idiopathic) with ulcer of bilateral lower extremity E11.622 Type 2 diabetes mellitus with other skin ulcer Discharge From [facility] Services Discharge from [place] - No further follow up needed, call if anything changes. Edema Control - Lymphedema / SCD / Other Elevate legs to the level of the heart or above for 30 minutes daily and/or when sitting, a frequency of: - Several times a day Avoid standing for long periods of time. Patient to wear own compression stockings every day. Moisturize legs daily. Electronic Signature(s) Signed: 06/03/2022 1:41:40 PM By: [name] DO Entered By: [name] on 06/03/2022 12:16:03 -------------------------------------------------------------------------------- Problem List Details Patient Name: Date of Service: [name], Ugonna L. 06/03/2022 9:30 A M Medical Record Number: [id number] Patient Account Number: [id number] Date of Birth/Sex: Treating RN: [birth year] (61 y.o. [name] Primary Care Provider: PCP, NO Other  Clinician: Referring Provider: Treating Provider/Extender: Mayer Camel in Treatment: 2 Active Problems ICD-10 Encounter Code Description Active Date MDM Diagnosis [phone] Non-pressure chronic ulcer of other part of right lower leg with fat layer 05/20/2022 No Yes exposed L97.822 Non-pressure chronic ulcer of other part of left lower leg with fat layer exposed7/28/2023 No Yes I89.0 Lymphedema, not elsewhere classified 05/20/2022 No Yes I87.313 Chronic venous hypertension (idiopathic) with ulcer of bilateral lower extremity 05/20/2022 No Yes E11.622 Type 2 diabetes  mellitus with other skin ulcer 05/20/2022 No Yes Inactive Problems Resolved Problems Electronic Signature(s) Signed: 06/03/2022 1:41:40 PM By: [name] DO Entered By: [name] on 06/03/2022 12:09:42 -------------------------------------------------------------------------------- Progress Note Details Patient Name: Date of Service: [name], Brekken L. 06/03/2022 9:30 A M Medical Record Number: [id number] Patient Account Number: [id number] Date of Birth/Sex: Treating RN: [birth year] (61 y.o. [name] Primary Care Provider: PCP, NO Other Clinician: Referring Provider: Treating Provider/Extender: Mayer Camel in Treatment: 2 Subjective Chief Complaint Information obtained from Patient 08/16/2021: Bilateral lower extremity scattered blisters. 1 small open wound limited to skin breakdown on the right lower extremity. 08/23/2021; 1 small open wound limited to skin breakdown on the left lower extremity 01/27/2022; patient with bilateral lower extremity wounds History of Present Illness (HPI) Admission 08/16/2021 Mr. Ashtyn Freilich is a 61 year old male with a past medical history of CVA, hypertension, uncontrolled type 2 diabetes on insulin complicated by peripheral neuropathy, Chronic venous insufficiency/lymphedema, diastolic CHF, and left lower extremity DVT  that presents to clinic today for an open wound to his right lower extremity. He reports an ongoing issue with blistering to his legs and weeping that wax and wane over the past 6 months. He was previously seen in our clinic in June 2022 however he had closed wounds at that time. He reported that his diuretic was increased then and helped with symptom control. About 2 months ago he developed increased blistering and swelling again. He reports wearing 20/30 compression stockings however it is unclear if he is doing this daily since he reports the stockings are very difficult to put on. He currently denies signs of infection. 10/31; patient presents for follow-up. He has tolerated the compression wraps well. The right leg wound has healed. He does have a small open wound now present on the left anterior lower extremity limited to skin breakdown. He would like to continue wrapping both legs. He states he cannot afford juxta lite compression and would like to use his compression stockings he currently owns. He denies signs of infection. 11/7; patient presents for follow-up. He has no issues or complaints today. While in office he scratched With his fingers at his right leg and now has a small open wound. He continues to have a left lower extremity wound. He tolerated the wraps well. He has his compression stockings today. He denies signs of infection. 11/14; patient presents for follow-up. He has his compression stockings today. He has no issues or complaints. Readmission 01/27/2022 Mr. Josemiguel Gries is a 61 year old male with a past medical history of CVA, hypertension, uncontrolled type 2 diabetes on insulin complicated by peripheral neuropathy, chronic venous insufficiency/lymphedema, diastolic CHF and left lower extremity DVT that presents to the clinic for bilateral lower extremity wounds. He states this started less than a week ago after scrubbing his legs in the shower. He has compression stockings  that he reports using daily. He has been keeping the wounds covered. He denies signs of infection. 4/13; patient returns to clinic today for treatment of his chronic venous insufficiency and lymphedema wounds on the left anterior mid tibia and the right posterior calf. We have been using silver alginate 4/20; patient presents for follow-up. He has his compression stockings today. He has no issues or complaints. Admission 05/20/2022 Mr. Talor Cheema is a 62 year old male with a past medical history of uncontrolled type 2 diabetes on insulin, venous insufficiency/lymphedema and diastolic congestive heart failure that presents to the clinic for a 1 week history scattered  open wounds to his lower extremities bilaterally. He states he has been wearing compression stockings. He has been keeping the areas covered. He denies signs of infection. 8/4; patient presents for follow-up. We have been using silver alginate under 4-layer compression to lower extremities bilaterally. He has tolerated this well. He has no issues or complaints today. He has his compression stockings with him. 8/11; Patient presents for follow-up. We have been using silver alginate under 4-layer compression. He has no issues or complaints today. He has his compression stockings with him. Patient History Unable to Obtain Patient History due to Altered Mental Status. Information obtained from Patient. Family History Cancer - Mother,Father, Diabetes - Mother,Siblings, Hypertension - Siblings,Mother, Seizures - Mother,Siblings, Stroke - Siblings, No family history of Heart Disease, Hereditary Spherocytosis, Kidney Disease, Lung Disease, Thyroid Problems, Tuberculosis. Social History Never smoker, Marital Status - Divorced, Alcohol Use - Rarely, Drug Use - No History, Caffeine Use - Daily. Medical History Eyes Denies history of Cataracts, Glaucoma, Optic Neuritis Ear/Nose/Mouth/Throat Denies history of Chronic sinus  problems/congestion, Middle ear problems Hematologic/Lymphatic Denies history of Anemia, Hemophilia, Human Immunodeficiency Virus, Lymphedema, Sickle Cell Disease Respiratory Denies history of Aspiration, Asthma, Chronic Obstructive Pulmonary Disease (COPD), Pneumothorax, Sleep Apnea, Tuberculosis Cardiovascular Patient has history of Congestive Heart Failure, Coronary Artery Disease, Deep Vein Thrombosis, Hypertension, Hypotension Denies history of Angina, Arrhythmia, Myocardial Infarction, Peripheral Arterial Disease, Peripheral Venous Disease, Phlebitis, Vasculitis Gastrointestinal Denies history of Cirrhosis , Colitis, Crohnoos, Hepatitis A, Hepatitis B, Hepatitis C Endocrine Patient has history of Type II Diabetes Denies history of Type I Diabetes Genitourinary Denies history of End Stage Renal Disease Immunological Denies history of Lupus Erythematosus, Raynaudoos, Scleroderma Integumentary (Skin) Denies history of History of Burn Musculoskeletal Denies history of Gout, Rheumatoid Arthritis, Osteoarthritis, Osteomyelitis Neurologic Patient has history of Neuropathy Denies history of Dementia, Quadriplegia, Paraplegia, Seizure Disorder Oncologic Denies history of Received Chemotherapy, Received Radiation Psychiatric Denies history of Anorexia/bulimia, Confinement Anxiety Hospitalization/Surgery History - inpatient PE 06/2021. - 01/13/2022 low BP. Medical A Surgical History Notes nd Respiratory PE September 2022 02 dependent at night PE to left lung June 2023 Objective Constitutional respirations regular, non-labored and within target range for patient.. Vitals Time Taken: 10:13 AM, Height: 70 in, Weight: 320 lbs, BMI: 45.9, Temperature: 98.6 F, Pulse: 79 bpm, Respiratory Rate: 18 breaths/min, Blood Pressure: 121/59 mmHg, Capillary Blood Glucose: 205 mg/dl. Cardiovascular 2+ dorsalis pedis/posterior tibialis pulses. Psychiatric pleasant and cooperative. General Notes:  Left lower extremity: Epithelization to the previous wound site. Good edema control. Integumentary (Hair, Skin) Wound #5 status is Healed - Epithelialized. Original cause of wound was Gradually Appeared. The date acquired was: 05/20/2022. The wound has been in treatment 2 weeks. The wound is located on the Left,Medial Lower Leg. The wound measures 0cm length x 0cm width x 0cm depth; 0cm^2 area and 0cm^3 volume. Assessment Active Problems ICD-10 Non-pressure chronic ulcer of other part of right lower leg with fat layer exposed Non-pressure chronic ulcer of other part of left lower leg with fat layer exposed Lymphedema, not elsewhere classified Chronic venous hypertension (idiopathic) with ulcer of bilateral lower extremity Type 2 diabetes mellitus with other skin ulcer Patient has done well with silver alginate under compression therapy. His wounds have healed. I recommended compression stockings daily. He may follow- up as needed. Plan Discharge From [facility] Services: Discharge from [place] - No further follow up needed, call if anything changes. Edema Control - Lymphedema / SCD / Other: Elevate legs to the level of the heart or  above for 30 minutes daily and/or when sitting, a frequency of: - Several times a day Avoid standing for long periods of time. Patient to wear own compression stockings every day. Moisturize legs daily. 1. Daily compression stockings 2. Discharge from clinic due to closed wound 3. Follow-up as needed Electronic Signature(s) Signed: 06/03/2022 1:41:40 PM By: [name] DO Entered By: [name] on 06/03/2022 12:18:09 -------------------------------------------------------------------------------- HxROS Details Patient Name: Date of Service: [name], Paton L. 06/03/2022 9:30 A M Medical Record Number: [id number] Patient Account Number: [id number] Date of Birth/Sex: Treating RN: February 11, 1961 (61 y.o. [name] Primary Care Provider:  PCP, NO Other Clinician: Referring Provider: Treating Provider/Extender: Mayer Camel in Treatment: 2 Unable to Obtain Patient History due to Altered Mental Status Information Obtained From Patient Eyes Medical History: Negative for: Cataracts; Glaucoma; Optic Neuritis Ear/Nose/Mouth/Throat Medical History: Negative for: Chronic sinus problems/congestion; Middle ear problems Hematologic/Lymphatic Medical History: Negative for: Anemia; Hemophilia; Human Immunodeficiency Virus; Lymphedema; Sickle Cell Disease Respiratory Medical History: Negative for: Aspiration; Asthma; Chronic Obstructive Pulmonary Disease (COPD); Pneumothorax; Sleep Apnea; Tuberculosis Past Medical History Notes: PE September 2022 02 dependent at night PE to left lung June 2023 Cardiovascular Medical History: Positive for: Congestive Heart Failure; Coronary Artery Disease; Deep Vein Thrombosis; Hypertension; Hypotension Negative for: Angina; Arrhythmia; Myocardial Infarction; Peripheral Arterial Disease; Peripheral Venous Disease; Phlebitis; Vasculitis Gastrointestinal Medical History: Negative for: Cirrhosis ; Colitis; Crohns; Hepatitis A; Hepatitis B; Hepatitis C Endocrine Medical History: Positive for: Type II Diabetes Negative for: Type I Diabetes Time with diabetes: 11 years Treated with: Insulin, Oral agents Blood sugar tested every day: Yes Tested : TID Blood sugar testing results: Breakfast: 200 Genitourinary Medical History: Negative for: End Stage Renal Disease Immunological Medical History: Negative for: Lupus Erythematosus; Raynauds; Scleroderma Integumentary (Skin) Medical History: Negative for: History of Burn Musculoskeletal Medical History: Negative for: Gout; Rheumatoid Arthritis; Osteoarthritis; Osteomyelitis Neurologic Medical History: Positive for: Neuropathy Negative for: Dementia; Quadriplegia; Paraplegia; Seizure Disorder Oncologic Medical  History: Negative for: Received Chemotherapy; Received Radiation Psychiatric Medical History: Negative for: Anorexia/bulimia; Confinement Anxiety Immunizations Pneumococcal Vaccine: Received Pneumococcal Vaccination: No Implantable Devices None Hospitalization / Surgery History Type of Hospitalization/Surgery inpatient PE 06/2021 01/13/2022 low BP Family and Social History Cancer: Yes - Mother,Father; Diabetes: Yes - Mother,Siblings; Heart Disease: No; Hereditary Spherocytosis: No; Hypertension: Yes - Siblings,Mother; Kidney Disease: No; Lung Disease: No; Seizures: Yes - Mother,Siblings; Stroke: Yes - Siblings; Thyroid Problems: No; Tuberculosis: No; Never smoker; Marital Status - Divorced; Alcohol Use: Rarely; Drug Use: No History; Caffeine Use: Daily; Financial Concerns: No; Food, Clothing or Shelter Needs: No; Support System Lacking: No; Transportation Concerns: No Electronic Signature(s) Signed: 06/03/2022 12:27:06 PM By: Lorrin Jackson Signed: 06/03/2022 1:41:40 PM By: [name] DO Entered By: [name] on 06/03/2022 12:15:11 -------------------------------------------------------------------------------- SuperBill Details Patient Name: Date of Service: [name], Marcellas L. 06/03/2022 Medical Record Number: [id number] Patient Account Number: [id number] Date of Birth/Sex: Treating RN: [birth year] (61 y.o. [name] Primary Care Provider: PCP, NO Other Clinician: Referring Provider: Treating Provider/Extender: Mayer Camel in Treatment: 2 Diagnosis Coding ICD-10 Codes Code Description [phone] Non-pressure chronic ulcer of other part of right lower leg with fat layer exposed L97.822 Non-pressure chronic ulcer of other part of left lower leg with fat layer exposed I89.0 Lymphedema, not elsewhere classified I87.313 Chronic venous hypertension (idiopathic) with ulcer of bilateral lower extremity E11.622 Type 2 diabetes mellitus with  other skin ulcer Facility Procedures CPT4 Code: [id number] Description: 65035 - WOUND CARE VISIT-LEV 2 EST  PT Modifier: Quantity: 1 Physician Procedures : CPT4 Code Description Modifier [id number] - WC PHYS LEVEL 3 - EST PT ICD-10 Diagnosis Description L97.822 Non-pressure chronic ulcer of other part of left lower leg with fat layer exposed L97.812 Non-pressure chronic ulcer of other part of right  lower leg with fat layer exposed I89.0 Lymphedema, not elsewhere classified I87.313 Chronic venous hypertension (idiopathic) with ulcer of bilateral lower extremity Quantity: 1 Electronic Signature(s) Signed: 06/03/2022 1:41:40 PM By: [name] DO Entered By: [name] on 06/03/2022 12:18:38

## 2022-06-03 NOTE — Progress Notes (Signed)
Juan, Stein ([id number]) Visit Report for 06/03/2022 Arrival Information Details Patient Name: Date of Service: [name], [name] 06/03/2022 9:30 A M Medical Record Number: [id number] Patient Account Number: [id number] Date of Birth/Sex: Treating RN: Mar 29, 1961 (61 y.o. [name] Primary Care Majesty Oehlert: PCP, NO Other Clinician: Referring Randi Poullard: Treating Carlena Ruybal/Extender: Mayer Camel in Treatment: 2 Visit Information History Since Last Visit Added or deleted any medications: No Patient Arrived: Ambulatory Any new allergies or adverse reactions: No Arrival Time: 10:08 Had a fall or experienced change in No Transfer Assistance: None activities of daily living that may affect Patient Requires Transmission-Based No risk of falls: Precautions: Signs or symptoms of abuse/neglect since last visito No Patient Has Alerts: Yes Hospitalized since last visit: No Patient Alerts: Patient on Blood Thinner Implantable device outside of the clinic excluding No Eliquis cellular tissue based products placed in the center 09/06/21 ABI L1.01 office since last visit: 09/06/21 ABI R1.02 office Has Dressing in Place as Prescribed: Yes Has Compression in Place as Prescribed: Yes Pain Present Now: No Electronic Signature(s) Signed: 06/03/2022 12:27:06 PM By: Lorrin Jackson Entered By: Lorrin Jackson on 06/03/2022 10:13:09 -------------------------------------------------------------------------------- Clinic Level of Care Assessment Details Patient Name: Date of Service: Juan, Stein 06/03/2022 9:30 A M Medical Record Number: [id number] Patient Account Number: [id number] Date of Birth/Sex: Treating RN: 09-06-1961 (61 y.o. [name] Primary Care Mccartney Chuba: PCP, NO Other Clinician: Referring Kaylenn Civil: Treating Emmit Oriley/Extender: Mayer Camel in Treatment: 2 Clinic Level of Care Assessment Items TOOL 4 Quantity Score X-  1 0 Use when only an EandM is performed on FOLLOW-UP visit ASSESSMENTS - Nursing Assessment / Reassessment X- 1 10 Reassessment of Co-morbidities (includes updates in patient status) X- 1 5 Reassessment of Adherence to Treatment Plan ASSESSMENTS - Wound and Skin A ssessment / Reassessment X - Simple Wound Assessment / Reassessment - one wound 1 5 '[]'$  - 0 Complex Wound Assessment / Reassessment - multiple wounds '[]'$  - 0 Dermatologic / Skin Assessment (not related to wound area) ASSESSMENTS - Focused Assessment '[]'$  - 0 Circumferential Edema Measurements - multi extremities '[]'$  - 0 Nutritional Assessment / Counseling / Intervention '[]'$  - 0 Lower Extremity Assessment (monofilament, tuning fork, pulses) '[]'$  - 0 Peripheral Arterial Disease Assessment (using hand held doppler) ASSESSMENTS - Ostomy and/or Continence Assessment and Care '[]'$  - 0 Incontinence Assessment and Management '[]'$  - 0 Ostomy Care Assessment and Management (repouching, etc.) PROCESS - Coordination of Care X - Simple Patient / Family Education for ongoing care 1 15 '[]'$  - 0 Complex (extensive) Patient / Family Education for ongoing care '[]'$  - 0 Staff obtains Programmer, systems, Records, T Results / Process Orders est '[]'$  - 0 Staff telephones HHA, Nursing Homes / Clarify orders / etc '[]'$  - 0 Routine Transfer to another Facility (non-emergent condition) '[]'$  - 0 Routine Hospital Admission (non-emergent condition) '[]'$  - 0 New Admissions / Biomedical engineer / Ordering NPWT Apligraf, etc. , '[]'$  - 0 Emergency Hospital Admission (emergent condition) X- 1 10 Simple Discharge Coordination '[]'$  - 0 Complex (extensive) Discharge Coordination PROCESS - Special Needs '[]'$  - 0 Pediatric / Minor Patient Management '[]'$  - 0 Isolation Patient Management '[]'$  - 0 Hearing / Language / Visual special needs '[]'$  - 0 Assessment of Community assistance (transportation, D/C planning, etc.) '[]'$  - 0 Additional assistance / Altered mentation '[]'$  -  0 Support Surface(s) Assessment (bed, cushion, seat, etc.) INTERVENTIONS - Wound Cleansing / Measurement '[]'$  - 0 Simple Wound Cleansing - one wound '[]'$  -  0 Complex Wound Cleansing - multiple wounds '[]'$  - 0 Wound Imaging (photographs - any number of wounds) '[]'$  - 0 Wound Tracing (instead of photographs) '[]'$  - 0 Simple Wound Measurement - one wound '[]'$  - 0 Complex Wound Measurement - multiple wounds INTERVENTIONS - Wound Dressings '[]'$  - 0 Small Wound Dressing one or multiple wounds '[]'$  - 0 Medium Wound Dressing one or multiple wounds '[]'$  - 0 Large Wound Dressing one or multiple wounds '[]'$  - 0 Application of Medications - topical '[]'$  - 0 Application of Medications - injection INTERVENTIONS - Miscellaneous '[]'$  - 0 External ear exam '[]'$  - 0 Specimen Collection (cultures, biopsies, blood, body fluids, etc.) '[]'$  - 0 Specimen(s) / Culture(s) sent or taken to Lab for analysis '[]'$  - 0 Patient Transfer (multiple staff / Civil Service fast streamer / Similar devices) '[]'$  - 0 Simple Staple / Suture removal (25 or less) '[]'$  - 0 Complex Staple / Suture removal (26 or more) '[]'$  - 0 Hypo / Hyperglycemic Management (close monitor of Blood Glucose) '[]'$  - 0 Ankle / Brachial Index (ABI) - do not check if billed separately X- 1 5 Vital Signs Has the patient been seen at the hospital within the last three years: Yes Total Score: 50 Level Of Care: New/Established - Level 2 Electronic Signature(s) Signed: 06/03/2022 12:27:06 PM By: Lorrin Jackson Entered By: Lorrin Jackson on 06/03/2022 10:28:44 -------------------------------------------------------------------------------- Encounter Discharge Information Details Patient Name: Date of Service: Juan Lunch, [name] 06/03/2022 9:30 A M Medical Record Number: [id number] Patient Account Number: [id number] Date of Birth/Sex: Treating RN: 31-Dec-1960 (61 y.o. [name] Primary Care Kamauri Denardo: PCP, NO Other Clinician: Referring Crysta Gulick: Treating Keyani Rigdon/Extender:  Mayer Camel in Treatment: 2 Encounter Discharge Information Items Discharge Condition: Stable Ambulatory Status: Ambulatory Discharge Destination: Home Transportation: Private Auto Schedule Follow-up Appointment: No Clinical Summary of Care: Provided on 06/03/2022 Form Type Recipient Paper Patient Patient Electronic Signature(s) Signed: 06/03/2022 12:27:06 PM By: Lorrin Jackson Entered By: Lorrin Jackson on 06/03/2022 10:32:49 -------------------------------------------------------------------------------- Lower Extremity Assessment Details Patient Name: Date of Service: Juan Lunch, Graylon L. 06/03/2022 9:30 A M Medical Record Number: [id number] Patient Account Number: [id number] Date of Birth/Sex: Treating RN: 08/20/1961 (61 y.o. [name] Primary Care Huckleberry Martinson: PCP, NO Other Clinician: Referring Halen Antenucci: Treating Keison Glendinning/Extender: Mayer Camel in Treatment: 2 Edema Assessment Assessed: Shirlyn Goltz: Yes] Patrice Paradise: No] Edema: [Left: Ye] [Right: s] Calf Left: Right: Point of Measurement: 33 cm From Medial Instep 48 cm Ankle Left: Right: Point of Measurement: 13 cm From Medial Instep 27 cm Vascular Assessment Pulses: Dorsalis Pedis Palpable: [Left:Yes] Electronic Signature(s) Signed: 06/03/2022 12:27:06 PM By: Lorrin Jackson Entered By: Lorrin Jackson on 06/03/2022 10:22:29 -------------------------------------------------------------------------------- Multi Wound Chart Details Patient Name: Date of Service: Juan Lunch, Falon L. 06/03/2022 9:30 A M Medical Record Number: [id number] Patient Account Number: [id number] Date of Birth/Sex: Treating RN: May 26, 1961 (61 y.o. [name] Primary Care Odis Wickey: PCP, NO Other Clinician: Referring Sherisa Gilvin: Treating Joey Lierman/Extender: Mayer Camel in Treatment: 2 Vital Signs Height(in): 70 Capillary Blood Glucose(mg/dl): 205 Weight(lbs):  320 Pulse(bpm): 24 Body Mass Index(BMI): 45.9 Blood Pressure(mmHg): 121/59 Temperature(F): 98.6 Respiratory Rate(breaths/min): 18 Photos: [N/A:N/A] Left, Medial Lower Leg N/A N/A Wound Location: Gradually Appeared N/A N/A Wounding Event: Venous Leg Ulcer N/A N/A Primary Etiology: Congestive Heart Failure, Coronary N/A N/A Comorbid History: Artery Disease, Deep Vein Thrombosis, Hypertension, Hypotension, Type II Diabetes, Neuropathy 05/20/2022 N/A N/A Date Acquired: 2 N/A N/A Weeks of Treatment: Healed - Epithelialized N/A N/A Wound Status: No N/A  N/A Wound Recurrence: 0x0x0 N/A N/A Measurements L x W x D (cm) 0 N/A N/A A (cm) : rea 0 N/A N/A Volume (cm) : 100.00% N/A N/A % Reduction in Area: 100.00% N/A N/A % Reduction in Volume: Full Thickness Without Exposed N/A N/A Classification: Support Structures Treatment Notes Electronic Signature(s) Signed: 06/03/2022 12:27:06 PM By: Lorrin Jackson Signed: 06/03/2022 1:41:40 PM By: [name] DO Entered By: [name] on 06/03/2022 12:09:55 -------------------------------------------------------------------------------- Multi-Disciplinary Care Plan Details Patient Name: Date of Service: Juan Lunch, Mamoudou L. 06/03/2022 9:30 A M Medical Record Number: [id number] Patient Account Number: [id number] Date of Birth/Sex: Treating RN: August 26, 1961 (61 y.o. [name] Primary Care Anselm Aumiller: PCP, NO Other Clinician: Referring Staley Lunz: Treating Jereld Presti/Extender: Mayer Camel in Treatment: 2 Active Inactive Electronic Signature(s) Signed: 06/03/2022 10:56:36 AM By: Lorrin Jackson Entered By: Lorrin Jackson on 06/03/2022 10:56:36 -------------------------------------------------------------------------------- Pain Assessment Details Patient Name: Date of Service: Juan Lunch, Gabor L. 06/03/2022 9:30 A M Medical Record Number: [id number] Patient Account Number: [id number] Date of  Birth/Sex: Treating RN: 07-08-1961 (61 y.o. [name] Primary Care Estephan Gallardo: PCP, NO Other Clinician: Referring Ira Busbin: Treating Surya Schroeter/Extender: Mayer Camel in Treatment: 2 Active Problems Location of Pain Severity and Description of Pain Patient Has Paino No Site Locations Pain Management and Medication Current Pain Management: Electronic Signature(s) Signed: 06/03/2022 12:27:06 PM By: Lorrin Jackson Entered By: Lorrin Jackson on 06/03/2022 10:15:54 -------------------------------------------------------------------------------- Patient/Caregiver Education Details Patient Name: Date of Service: Juan Lunch, [name] 8/11/2023andnbsp9:30 A M Medical Record Number: [id number] Patient Account Number: [id number] Date of Birth/Gender: Treating RN: 01/24/1961 (61 y.o. [name] Primary Care Physician: PCP, NO Other Clinician: Referring Physician: Treating Physician/Extender: Mayer Camel in Treatment: 2 Education Assessment Education Provided To: Patient Education Topics Provided Venous: Methods: Explain/Verbal, Printed Responses: State content correctly Wound/Skin Impairment: Methods: Demonstration, Explain/Verbal, Printed Responses: State content correctly Electronic Signature(s) Signed: 06/03/2022 12:27:06 PM By: Lorrin Jackson Entered By: Lorrin Jackson on 06/03/2022 10:08:17 -------------------------------------------------------------------------------- Wound Assessment Details Patient Name: Date of Service: Juan Lunch, Slater L. 06/03/2022 9:30 A M Medical Record Number: [id number] Patient Account Number: [id number] Date of Birth/Sex: Treating RN: [birth year] (61 y.o. [name] Primary Care Ziad Maye: PCP, NO Other Clinician: Referring Garima Chronis: Treating Josclyn Rosales/Extender: Mayer Camel in Treatment: 2 Wound Status Wound Number: 5 Primary Venous Leg  Ulcer Etiology: Wound Location: Left, Medial Lower Leg Wound Healed - Epithelialized Wounding Event: Gradually Appeared Status: Date Acquired: 05/20/2022 Comorbid Congestive Heart Failure, Coronary Artery Disease, Deep Vein Weeks Of Treatment: 2 History: Thrombosis, Hypertension, Hypotension, Type II Diabetes, Clustered Wound: No Neuropathy Photos Wound Measurements Length: (cm) Width: (cm) Depth: (cm) Area: (cm) Volume: (cm) 0 % Reduction in Area: 100% 0 % Reduction in Volume: 100% 0 0 0 Wound Description Classification: Full Thickness Without Exposed Support Structur es Electronic Signature(s) Signed: 06/03/2022 12:27:06 PM By: Lorrin Jackson Entered By: Lorrin Jackson on 06/03/2022 10:27:24 -------------------------------------------------------------------------------- [place] Details Patient Name: Date of Service: Juan Lunch, Romie L. 06/03/2022 9:30 A M Medical Record Number: [id number] Patient Account Number: [id number] Date of Birth/Sex: Treating RN: [birth year] (61 y.o. [name] Primary Care Randale Carvalho: PCP, NO Other Clinician: Referring Afia Messenger: Treating Chiamaka Latka/Extender: Mayer Camel in Treatment: 2 Vital Signs Time Taken: 10:13 Temperature (F): 98.6 Height (in): 70 Pulse (bpm): 79 Weight (lbs): 320 Respiratory Rate (breaths/min): 18 Body Mass Index (BMI): 45.9 Blood Pressure (mmHg): 121/59 Capillary Blood Glucose (mg/dl): 205 Reference Range: 80 - 120 mg / dl  Electronic Signature(s) Signed: 06/03/2022 12:27:06 PM By: Lorrin Jackson Entered By: Lorrin Jackson on 06/03/2022 10:15:44

## 2022-06-06 ENCOUNTER — Ambulatory Visit (HOSPITAL_COMMUNITY)
Admission: EM | Admit: 2022-06-06 | Discharge: 2022-06-06 | Disposition: A | Discharge status: Home / Self Care | Payer: Medicaid Other | Attending: Physician Assistant | Admitting: Physician Assistant

## 2022-06-06 ENCOUNTER — Encounter (HOSPITAL_BASED_OUTPATIENT_CLINIC_OR_DEPARTMENT_OTHER): Payer: Medicaid Other | Admitting: Internal Medicine

## 2022-06-06 ENCOUNTER — Encounter (HOSPITAL_COMMUNITY): Payer: Self-pay | Admitting: Emergency Medicine

## 2022-06-06 DIAGNOSIS — M79601 Pain in right arm: Secondary | ICD-10-CM | POA: Diagnosis not present

## 2022-06-06 DIAGNOSIS — L02411 Cutaneous abscess of right axilla: Secondary | ICD-10-CM

## 2022-06-06 MED ORDER — SULFAMETHOXAZOLE-TRIMETHOPRIM 800-160 MG PO TABS: 1.0000 | ORAL_TABLET | Freq: Two times a day (BID) | ORAL | 0 refills | Status: AC

## 2022-06-06 NOTE — Discharge Instructions (Signed)
Advised to continue to using warm compresses frequently to the area to help it soften. Advised take the Bactrim DS 1 every 12 hours to help treat the infection and soften the abscess area. You have been [facility] and treatment of the area, they should be calling you within next 48 hours to set up an appointment for evaluation.

## 2022-06-06 NOTE — ED Triage Notes (Signed)
Pt c/o swollen knot in right axilla area for about a week. Reports put salt and other home remedies on it but becoming larger and more painful.

## 2022-06-06 NOTE — Progress Notes (Signed)
HPI: FU CAD and diastolic CHF. Pt had DES to LAD 8/17. CTA 9/17 showed no pulmonary embolus. Cardiac catheterization February 2019 showed patent stents and normal LV function. Left ventricular end-diastolic pressure elevated at 27 mmHg. Carotid Dopplers April 2019 showed 1 to 39% right and near normal left carotid.  Nuclear study January 2023 showed ejection fraction 51% with no ischemia or infarction.  Pulmonary embolus is being followed by hematology.  Patient had his secondary pulmonary embolus June 2023.  Echocardiogram May 2023 showed normal LV function, mild left atrial enlargement.  Venous Dopplers May 2023 showed no DVT.  Since last seen he has some dyspnea on exertion unchanged.  No orthopnea or PND.  Intermittent mild pedal edema.  He occasionally has chest pain for several seconds which is chronic.  Current Outpatient Medications  Medication Sig Dispense Refill   ACCU-CHEK GUIDE test strip      albuterol (VENTOLIN HFA) 108 (90 Base) MCG/ACT inhaler Inhale 2 puffs into the lungs every 6 (six) hours as needed for wheezing or shortness of breath.     amoxicillin-clavulanate (AUGMENTIN) 875-125 MG tablet Take 1 tablet by mouth every 12 (twelve) hours for 7 days. 14 tablet 0   apixaban (ELIQUIS) 5 MG TABS tablet Take 1 tablet (5 mg total) by mouth 2 (two) times daily. 60 tablet 2   atorvastatin (LIPITOR) 40 MG tablet Take 40 mg by mouth at bedtime.     carvedilol (COREG) 6.25 MG tablet Take 1 tablet (6.25 mg total) by mouth 2 (two) times daily with a meal. 60 tablet 2   Continuous Blood Gluc Sensor (FREESTYLE LIBRE 2 SENSOR) MISC      cyanocobalamin 1000 MCG tablet Take 1,000 mcg by mouth daily.     diclofenac (FLECTOR) 1.3 % PTCH Place 1 patch onto the skin 2 (two) times daily as needed (back and knee pain).     fenofibrate (TRICOR) 145 MG tablet TAKE 1 TABLET (145 MG TOTAL) BY MOUTH DAILY. 90 tablet 3   Fluticasone Propionate (FLONASE ALLERGY RELIEF NA) Place 1 spray into the nose  daily as needed (congestion).     furosemide (LASIX) 40 MG tablet Take 40 mg by mouth 2 (two) times daily.     gabapentin (NEURONTIN) 800 MG tablet Take 800 mg by mouth 3 (three) times daily.     glipiZIDE (GLUCOTROL) 10 MG tablet Take 10 mg by mouth 2 (two) times daily.  4   HUMULIN R U-500 KWIKPEN 500 UNIT/ML kwikpen Inject 85-110 Units into the skin See admin instructions. Per sliding scale 3 times daily with meals     Insulin Pen Needle 31G X 5 MM MISC Use 1 needle daily to inject insulin as prescribed 100 each 2   isosorbide mononitrate (IMDUR) 30 MG 24 hr tablet TAKE 1 TABLET BY MOUTH DAILY (Patient taking differently: Take 30 mg by mouth daily.) 28 tablet 12   lisinopril (ZESTRIL) 20 MG tablet Take 20 mg by mouth daily.     Menthol, Topical Analgesic, (BIOFREEZE EX) Apply 1 application topically 2 (two) times daily as needed (knee pain).     nitroGLYCERIN (NITROSTAT) 0.4 MG SL tablet Place 1 tablet (0.4 mg total) under the tongue every 5 (five) minutes as needed for chest pain. 100 tablet 1   ondansetron (ZOFRAN) 4 MG tablet Take 4 mg by mouth 2 (two) times daily as needed for nausea/vomiting, vomiting or nausea.     Oxycodone HCl 20 MG TABS Take 20 mg by mouth  4 (four) times daily as needed (pain).     pantoprazole (PROTONIX) 40 MG tablet Take 1 tablet by mouth daily.     potassium chloride (MICRO-K) 10 MEQ CR capsule Take 20 mEq by mouth 2 (two) times daily.     sertraline (ZOLOFT) 50 MG tablet Take 50 mg by mouth every morning.     tiZANidine (ZANAFLEX) 4 MG tablet Take 4 mg by mouth at bedtime as needed for muscle spasms.     traZODone (DESYREL) 50 MG tablet Take 1 tablet (50 mg total) by mouth at bedtime. 30 tablet 11   Vitamin D, Ergocalciferol, (DRISDOL) 1.25 MG (50000 UT) CAPS capsule Take 50,000 Units by mouth every Monday.     No current facility-administered medications for this visit.     Past Medical History:  Diagnosis Date   Anemia    Arthritis    Back pain    CAD  (coronary artery disease)    a. s/p DES to LAD in 05/2016   Cervical radiculopathy    Chronic diastolic CHF (congestive heart failure) (HCC)    Chronic pain    Depression    DVT (deep venous thrombosis) (HCC)    Hematemesis    Hepatic steatosis    Hyperlipidemia    Hypertension    IBS (irritable bowel syndrome)    Morbid obesity (HCC)    OSA (obstructive sleep apnea)    Pancreatitis    PE (pulmonary thromboembolism) ([place])    Peripheral neuropathy    PUD (peptic ulcer disease)    Renal disorder    Stroke [facility])    a. ?details unclear - not seen on imaging when he was admitted in 05/2017 for TIA symptoms which were felt due to cervical radiculopathy.   Thoracic aortic ectasia (HCC)    a. 4.3cm ectatic ascending thoracic aorta by CT 06/2017.    Type 2 diabetes mellitus ([place])     Past Surgical History:  Procedure Laterality Date   CARDIAC CATHETERIZATION N/A 05/31/2016   Procedure: Left Heart Cath and Coronary Angiography;  Surgeon: Peter M Martinique, MD;  Location: [place] [facility];  Service: Cardiovascular;  Laterality: N/A;   CARDIAC CATHETERIZATION N/A 05/31/2016   Procedure: Intravascular Pressure Wire/FFR Study;  Surgeon: Peter M Martinique, MD;  Location: [place] [facility];  Service: Cardiovascular;  Laterality: N/A;   CARDIAC CATHETERIZATION N/A 05/31/2016   Procedure: Coronary Stent Intervention;  Surgeon: Peter M Martinique, MD;  Location: [place] [facility];  Service: Cardiovascular;  Laterality: N/A;   COLONOSCOPY N/A 07/17/2021   Procedure: COLONOSCOPY;  Surgeon: Wilford Corner, MD;  Location: [facility];  Service: Endoscopy;  Laterality: N/A;   COLONOSCOPY WITH PROPOFOL N/A 04/29/2020   Procedure: COLONOSCOPY WITH PROPOFOL;  Surgeon: Wilford Corner, MD;  Location: [place];  Service: Endoscopy;  Laterality: N/A;   ESOPHAGOGASTRODUODENOSCOPY N/A 04/29/2020   Procedure: ESOPHAGOGASTRODUODENOSCOPY (EGD);  Surgeon: Wilford Corner, MD;  Location: [place];  Service:  Endoscopy;  Laterality: N/A;   ESOPHAGOGASTRODUODENOSCOPY (EGD) WITH PROPOFOL N/A 07/17/2021   Procedure: ESOPHAGOGASTRODUODENOSCOPY (EGD) WITH PROPOFOL;  Surgeon: Wilford Corner, MD;  Location: [facility];  Service: Endoscopy;  Laterality: N/A;   LEFT HEART CATH AND CORONARY ANGIOGRAPHY N/A 12/08/2017   Procedure: LEFT HEART CATH AND CORONARY ANGIOGRAPHY;  Surgeon: Leonie Man, MD;  Location: [place] [facility];  Service: Cardiovascular;  Laterality: N/A;   LEFT HEART CATHETERIZATION WITH CORONARY ANGIOGRAM N/A 02/03/2014   Procedure: LEFT HEART CATHETERIZATION WITH CORONARY ANGIOGRAM;  Surgeon: Pixie Casino, MD;  Location: [place] [facility];  Service: Cardiovascular;  Laterality: N/A;   left leg stent      POLYPECTOMY  04/29/2020   Procedure: POLYPECTOMY;  Surgeon: Wilford Corner, MD;  Location: [facility] ENDOSCOPY;  Service: Endoscopy;;   POLYPECTOMY  07/17/2021   Procedure: POLYPECTOMY;  Surgeon: Wilford Corner, MD;  Location: [facility];  Service: Endoscopy;;    Social History   Socioeconomic History   Marital status: Single    Spouse name: Not on file   Number of children: 3   Years of education: 12   Highest education level: Not on file  Occupational History   Occupation: Disabled  Tobacco Use   Smoking status: Never    Passive exposure: Past   Smokeless tobacco: Never  Vaping Use   Vaping Use: Never used  Substance and Sexual Activity   Alcohol use: No   Drug use: No   Sexual activity: Not on file  Other Topics Concern   Not on file  Social History Narrative   Independent and ambulatory with cane.   Lives at home alone.   Right-handed.   1-2 cups caffeine per day.   Social Determinants of Health   Financial Resource Strain: Not on file  Food Insecurity: Not on file  Transportation Needs: Not on file  Physical Activity: Not on file  Stress: Not on file  Social Connections: Not on file  Intimate Partner Violence: Not on file    Family History  Problem  Relation Age of Onset   Cancer Father    Hypertension Mother    Diabetes Mother    Breast cancer Mother    Hypertension Brother    Diabetes Brother    Hypertension Sister    Diabetes Sister     ROS: no fevers or chills, productive cough, hemoptysis, dysphasia, odynophagia, melena, hematochezia, dysuria, hematuria, rash, seizure activity, orthopnea, PND, claudication. Remaining systems are negative.  Physical Exam: Well-developed obese In no acute distress.  Skin is warm and dry.  HEENT is normal.  Neck is supple.  Chest is clear to auscultation with normal expansion.  Cardiovascular exam is regular rate and rhythm.  Abdominal exam nontender or distended. No masses palpated. Extremities show 1+ edema. neuro grossly intact  A/P  1 coronary artery disease-continue Lipitor.  No aspirin given need for apixaban.  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  Continue apixaban.  3 chronic chest pain-patient continues to have occasional chest pain for seconds that is atypical.  We will follow for now.  4 chronic diastolic congestive heart failure-given obesity his volume status is difficult to assess.  He has also been over diuresed in the past due to complaints of dyspnea.  We will continue diuretics at present dose for now.  5 hypertension-patient's blood pressure is controlled.  Continue present medical regimen.  6 dyspnea-this is felt to be multifactorial including history of pulmonary emboli, chronic diastolic congestive heart failure, obesity hypoventilation syndrome and possible contribution from sleep apnea.  7 carotid artery disease-mild on most recent Dopplers.  8 morbid obesity-we discussed the importance of diet, exercise and weight loss.  Kirk Ruths, MD

## 2022-06-06 NOTE — ED Provider Notes (Signed)
[name]    CSN: [id number] Arrival date & time: 06/06/22  1015      History   Chief Complaint Chief Complaint  Patient presents with   Abscess    HPI [name] is a 61 y.o. male.   D36-year-old male presents with an abscess right axilla.  Patient indicates that he is a diabetic and takes insulin, he also was taking Eliquis for a blood clot in the lung.  Patient indicates for the past week he has been having a progressive right axilla tenderness, soreness and swelling.  Patient indicates that the area is large and he has been using warm salt water compresses frequently to try to improve the area this is only been intermittently effective.  He indicates that he is taking Tylenol or for pain relief but he does have some oxycodone that he takes because of having chronic back pain.  He relates the area is not draining, he has no fever or chills.   Abscess   Past Medical History:  Diagnosis Date   Anemia    Arthritis    Back pain    CAD (coronary artery disease)    a. s/p DES to LAD in 05/2016   Cervical radiculopathy    Chronic diastolic CHF (congestive heart failure) (HCC)    Chronic pain    Depression    DVT (deep venous thrombosis) (HCC)    Hematemesis    Hepatic steatosis    Hyperlipidemia    Hypertension    IBS (irritable bowel syndrome)    Morbid obesity (HCC)    OSA (obstructive sleep apnea)    Pancreatitis    PE (pulmonary thromboembolism) ([place])    Peripheral neuropathy    PUD (peptic ulcer disease)    Renal disorder    Stroke [facility])    a. ?details unclear - not seen on imaging when he was admitted in 05/2017 for TIA symptoms which were felt due to cervical radiculopathy.   Thoracic aortic ectasia (HCC)    a. 4.3cm ectatic ascending thoracic aorta by CT 06/2017.    Type 2 diabetes mellitus ([place])     Patient Active Problem List   Diagnosis Date Noted   Acute metabolic encephalopathy 08/09/5101   Blood per rectum 03/22/2022   Acute upper  GI bleed 03/22/2022   Acute kidney injury ([place]) 01/12/2022   GI bleed 07/15/2021   Pulmonary embolism ([place]) 07/15/2021   Acute pulmonary embolism ([place]) 06/26/2021   Diabetic ulcer of lower leg ([place]) 06/26/2021   Acute respiratory failure with hypoxia ([place]) 06/26/2021   COVID-19 virus infection 06/26/2021   Lumbar radiculitis 03/30/2021   Morbid obesity ([place]) 01/15/2021   Personal history of colonic polyps 01/15/2021   Rectal bleeding 01/15/2021   Pain due to onychomycosis of toenails of both feet 01/15/2021   Epistaxis, recurrent 10/21/2020   Laceration of nose 10/21/2020   Syncope 10/15/2020   OSA (obstructive sleep apnea)    Bilateral carotid artery stenosis 07/28/2020   Bilateral lower extremity edema 07/28/2020   CHF (congestive heart failure) ([place]) 07/28/2020   DDD (degenerative disc disease), lumbar 07/28/2020   Diabetic peripheral neuropathy ([place]) 07/28/2020   Fatty liver 07/28/2020   Irritable bowel syndrome with diarrhea 07/28/2020   Osteoarthritis of right hip 07/28/2020   Upper GI bleed 04/28/2020   Hypertensive urgency 04/27/2020   Acute GI bleeding 04/27/2020   Mixed diabetic hyperlipidemia associated with type 2 diabetes mellitus ([place]) 04/27/2020   Rotator cuff arthropathy 10/30/2019   Cervical radiculopathy  09/24/2019   Trochanteric bursitis of right hip 09/24/2019   Chronic diastolic CHF (congestive heart failure) ([place]) 08/23/2019   ARF (acute renal failure) ([place]) 08/01/2019   Hyperkalemia 08/01/2019   Uncontrolled type 2 diabetes mellitus with hyperglycemia ([place]) 08/01/2019   AKI (acute kidney injury) ([place])    Hypotension 07/31/2019   DKA (diabetic ketoacidosis) ([place]) 05/05/2019   Blurred vision 05/05/2019   Trochanteric bursitis 02/11/2019   Long-term insulin use ([place]) 01/25/2019   Alteration consciousness 11/19/2018   Neck pain 10/09/2018   Left arm weakness 10/09/2018   B12 deficiency 08/10/2017   Persistent headaches 08/09/2017   GERD  (gastroesophageal reflux disease) 06/29/2017   Left arm numbness    History of CVA (cerebrovascular accident) 06/17/2017   TIA (transient ischemic attack) 06/17/2017   Chronic back pain 12/29/2016   Depression 12/15/2016   Vitamin D deficiency 12/09/2016   Right hip pain 12/07/2016   Hypokalemia 12/07/2016   Type 2 diabetes mellitus with vascular disease ([place]) 05/31/2016   Normocytic normochromic anemia 05/31/2016   Chest pain 05/31/2016   Coronary artery disease involving native coronary artery of native heart without angina pectoris 01/06/2016   Lactic acidosis 05/28/2014   Nonspecific chest pain 01/29/2014   DM2 (diabetes mellitus, type 2) ([place]) 01/29/2014   Obesity, Class III, BMI 40-49.9 (morbid obesity) ([place]) 01/29/2014   Snoring 01/29/2014   Dyslipidemia 01/29/2014   HTN (hypertension) 01/29/2014   Abnormal nuclear stress test 01/29/2014    Past Surgical History:  Procedure Laterality Date   CARDIAC CATHETERIZATION N/A 05/31/2016   Procedure: Left Heart Cath and Coronary Angiography;  Surgeon: Peter M Martinique, MD;  Location: [place] [facility];  Service: Cardiovascular;  Laterality: N/A;   CARDIAC CATHETERIZATION N/A 05/31/2016   Procedure: Intravascular Pressure Wire/FFR Study;  Surgeon: Peter M Martinique, MD;  Location: [place] CV LAB;  Service: Cardiovascular;  Laterality: N/A;   CARDIAC CATHETERIZATION N/A 05/31/2016   Procedure: Coronary Stent Intervention;  Surgeon: Peter M Martinique, MD;  Location: [place] CV LAB;  Service: Cardiovascular;  Laterality: N/A;   COLONOSCOPY N/A 07/17/2021   Procedure: COLONOSCOPY;  Surgeon: Wilford Corner, MD;  Location: [facility];  Service: Endoscopy;  Laterality: N/A;   COLONOSCOPY WITH PROPOFOL N/A 04/29/2020   Procedure: COLONOSCOPY WITH PROPOFOL;  Surgeon: Wilford Corner, MD;  Location: [place];  Service: Endoscopy;  Laterality: N/A;   ESOPHAGOGASTRODUODENOSCOPY N/A 04/29/2020   Procedure: ESOPHAGOGASTRODUODENOSCOPY (EGD);   Surgeon: Wilford Corner, MD;  Location: [place];  Service: Endoscopy;  Laterality: N/A;   ESOPHAGOGASTRODUODENOSCOPY (EGD) WITH PROPOFOL N/A 07/17/2021   Procedure: ESOPHAGOGASTRODUODENOSCOPY (EGD) WITH PROPOFOL;  Surgeon: Wilford Corner, MD;  Location: [facility];  Service: Endoscopy;  Laterality: N/A;   LEFT HEART CATH AND CORONARY ANGIOGRAPHY N/A 12/08/2017   Procedure: LEFT HEART CATH AND CORONARY ANGIOGRAPHY;  Surgeon: Leonie Man, MD;  Location: [place] CV LAB;  Service: Cardiovascular;  Laterality: N/A;   LEFT HEART CATHETERIZATION WITH CORONARY ANGIOGRAM N/A 02/03/2014   Procedure: LEFT HEART CATHETERIZATION WITH CORONARY ANGIOGRAM;  Surgeon: Pixie Casino, MD;  Location: [facility] CATH LAB;  Service: Cardiovascular;  Laterality: N/A;   left leg stent      POLYPECTOMY  04/29/2020   Procedure: POLYPECTOMY;  Surgeon: Wilford Corner, MD;  Location: [facility] ENDOSCOPY;  Service: Endoscopy;;   POLYPECTOMY  07/17/2021   Procedure: POLYPECTOMY;  Surgeon: Wilford Corner, MD;  Location: [facility] ENDOSCOPY;  Service: Endoscopy;;       Home Medications    Prior to Admission medications  Medication Sig Start Date End Date Taking? Authorizing Provider  sulfamethoxazole-trimethoprim (BACTRIM DS) 800-160 MG tablet Take 1 tablet by mouth 2 (two) times daily for 7 days. 06/06/22 06/13/22 Yes Nyoka Lint, PA-C  ACCU-CHEK GUIDE test strip  05/19/22   [provider]  albuterol (VENTOLIN HFA) 108 (90 Base) MCG/ACT inhaler Inhale 2 puffs into the lungs every 6 (six) hours as needed for wheezing or shortness of breath.    [provider]  apixaban (ELIQUIS) 5 MG TABS tablet Take 2 tablets (10 mg total) by mouth 2 (two) times daily for 3 days. Patient taking differently: Take 5 mg by mouth 2 (two) times daily. 03/26/22 05/01/22  Hosie Poisson, MD  apixaban (ELIQUIS) 5 MG TABS tablet Take 1 tablet (5 mg total) by mouth 2 (two) times daily. Patient not taking: Reported on 05/01/2022 03/30/22    Hosie Poisson, MD  atorvastatin (LIPITOR) 40 MG tablet Take 40 mg by mouth at bedtime.    [provider]  carvedilol (COREG) 6.25 MG tablet Take 1 tablet (6.25 mg total) by mouth 2 (two) times daily with a meal. 03/26/22   Hosie Poisson, MD  Continuous Blood Gluc Sensor (FREESTYLE LIBRE 2 SENSOR) MISC  05/02/22   [provider]  cyanocobalamin 1000 MCG tablet Take 1,000 mcg by mouth daily. 03/06/20   [provider]  diclofenac (FLECTOR) 1.3 % PTCH Place 1 patch onto the skin 2 (two) times daily as needed (back and knee pain).    [provider]  fenofibrate (TRICOR) 145 MG tablet TAKE 1 TABLET (145 MG TOTAL) BY MOUTH DAILY. 05/19/22   Deberah Pelton, NP  Fluticasone Propionate (FLONASE ALLERGY RELIEF NA) Place 1 spray into the nose daily as needed (congestion).    [provider]  furosemide (LASIX) 20 MG tablet Take 2 tablets (40 mg total) by mouth daily. Patient not taking: Reported on 05/01/2022 03/27/22   Hosie Poisson, MD  furosemide (LASIX) 40 MG tablet Take 40 mg by mouth 2 (two) times daily.    [provider]  gabapentin (NEURONTIN) 800 MG tablet Take 800 mg by mouth 3 (three) times daily. 09/29/21   [provider]  glipiZIDE (GLUCOTROL) 10 MG tablet Take 10 mg by mouth 2 (two) times daily. 05/30/17   [provider]  HUMULIN R U-500 KWIKPEN 500 UNIT/ML kwikpen Inject 85-110 Units into the skin See admin instructions. Per sliding scale 3 times daily with meals 06/04/20   [provider]  Insulin Pen Needle 31G X 5 MM MISC Use 1 needle daily to inject insulin as prescribed 06/18/17   Barton Dubois, MD  isosorbide mononitrate (IMDUR) 30 MG 24 hr tablet TAKE 1 TABLET BY MOUTH DAILY Patient taking differently: Take 30 mg by mouth daily. 01/03/22   Hilty, Nadean Corwin, MD  lisinopril (ZESTRIL) 20 MG tablet Take 20 mg by mouth daily.    [provider]  Menthol, Topical Analgesic, (BIOFREEZE EX) Apply 1 application  topically 2 (two) times daily as needed (knee pain).    [provider]  nitroGLYCERIN (NITROSTAT) 0.4 MG SL tablet Place 1 tablet (0.4 mg total) under the tongue every 5 (five) minutes as needed for chest pain. 02/06/19   Skeet Latch, MD  ondansetron (ZOFRAN) 4 MG tablet Take 4 mg by mouth 2 (two) times daily as needed for nausea/vomiting, vomiting or nausea. 04/21/20   [provider]  Oxycodone HCl 20 MG TABS Take 20 mg by mouth 4 (four) times daily as needed (pain).  [provider]  pantoprazole (PROTONIX) 40 MG tablet Take 1 tablet by mouth daily. 05/26/22   [provider]  potassium chloride (MICRO-K) 10 MEQ CR capsule Take 20 mEq by mouth 2 (two) times daily. 04/03/20   [provider]  sertraline (ZOLOFT) 50 MG tablet Take 50 mg by mouth every morning. 12/17/20   [provider]  tiZANidine (ZANAFLEX) 4 MG tablet Take 4 mg by mouth at bedtime as needed for muscle spasms. 03/31/20   [provider]  traZODone (DESYREL) 50 MG tablet Take 1 tablet (50 mg total) by mouth at bedtime. 12/15/16   Florinda Marker, MD  Vitamin D, Ergocalciferol, (DRISDOL) 1.25 MG (50000 UT) CAPS capsule Take 50,000 Units by mouth every Monday. 08/15/19   [provider]    Family History Family History  Problem Relation Age of Onset   Cancer Father    Hypertension Mother    Diabetes Mother    Breast cancer Mother    Hypertension Brother    Diabetes Brother    Hypertension Sister    Diabetes Sister     Social History Social History   Tobacco Use   Smoking status: Never   Smokeless tobacco: Never  Vaping Use   Vaping Use: Never used  Substance Use Topics   Alcohol use: No   Drug use: No     Allergies   Coconut flavor [flavoring agent], Ibuprofen, Aleve [naproxen], Nsaids, Other, and Vascepa [icosapent ethyl]   Review of Systems Review of Systems  Skin:  Positive for wound (right axilla abscess).     Physical  Exam Triage Vital Signs ED Triage Vitals  Enc Vitals Group     BP 06/06/22 1124 119/64     Pulse Rate 06/06/22 1124 80     Resp 06/06/22 1124 20     Temp 06/06/22 1124 98.3 F (36.8 C)     Temp Source 06/06/22 1124 Oral     SpO2 06/06/22 1124 93 %     Weight --      Height --      Head Circumference --      Peak Flow --      Pain Score 06/06/22 1122 7     Pain Loc --      Pain Edu? --      Excl. in [place]? --    No data found.  Updated Vital Signs BP 119/64 (BP Location: Right Arm)   Pulse 80   Temp 98.3 F (36.8 C) (Oral)   Resp 20   SpO2 93%   Visual Acuity Right Eye Distance:   Left Eye Distance:   Bilateral Distance:    Right Eye Near:   Left Eye Near:    Bilateral Near:     Physical Exam Constitutional:      Appearance: Normal appearance.  Skin:    Comments: Right axilla: There is an 8 cm x 4 cm abscess which is present, this is hard with very little fluctuance in the middle of the abscess.  There is mild redness associated around the abscess.  There is tenderness on palpation.  There is no pointing or drainage from the abscess.  Neurological:     Mental Status: He is alert.      UC Treatments / Results  Labs (all labs ordered are listed, but only abnormal results are displayed) Labs Reviewed - No data to display  EKG   Radiology No results found.  Procedures Procedures (including critical care time)  Medications Ordered in UC Medications - No data to display  Initial Impression / Assessment and Plan / UC Course  I have reviewed the triage vital signs and the nursing notes.  Pertinent labs & imaging results that were available during my care of the patient were reviewed by me and considered in my medical decision making (see chart for details).    Plan: 1.  Advised take the Bactrim DS 1 twice daily to help soften the abscess area. 2.  Advised to continue warm compresses frequently to the area to help soften the abscess. 3.  Patient has been  internally referred to [facility] for evaluation and treatment of the abscess. 4.  Advised to follow-up with PCP or return to urgent care as needed. Final Clinical Impressions(s) / UC Diagnoses   Final diagnoses:  Abscess of axilla, right  Right arm pain     Discharge Instructions      Advised to continue to using warm compresses frequently to the area to help it soften. Advised take the Bactrim DS 1 every 12 hours to help treat the infection and soften the abscess area. [facility] and treatment of the area, they should be calling you within next 48 hours to set up an appointment for evaluation.    ED Prescriptions     Medication Sig Dispense Auth. Provider   sulfamethoxazole-trimethoprim (BACTRIM DS) 800-160 MG tablet Take 1 tablet by mouth 2 (two) times daily for 7 days. 14 tablet Nyoka Lint, PA-C      PDMP not reviewed this encounter.   Nyoka Lint, PA-C 06/06/22 1238

## 2022-06-10 ENCOUNTER — Ambulatory Visit (HOSPITAL_COMMUNITY)
Admission: EM | Admit: 2022-06-10 | Discharge: 2022-06-10 | Disposition: A | Discharge status: Home / Self Care | Payer: Medicaid Other | Attending: Internal Medicine | Admitting: Internal Medicine

## 2022-06-10 ENCOUNTER — Encounter (HOSPITAL_COMMUNITY): Payer: Self-pay

## 2022-06-10 DIAGNOSIS — L02411 Cutaneous abscess of right axilla: Secondary | ICD-10-CM

## 2022-06-10 MED ORDER — LIDOCAINE-EPINEPHRINE 1 %-1:100000 IJ SOLN
INTRAMUSCULAR | Status: AC
  Filled 2022-06-10: qty 1

## 2022-06-10 MED ORDER — AMOXICILLIN-POT CLAVULANATE 875-125 MG PO TABS: 1.0000 | ORAL_TABLET | Freq: Two times a day (BID) | ORAL | 0 refills | Status: DC

## 2022-06-10 NOTE — ED Provider Notes (Signed)
[place]    CSN: [id number] Arrival date & time: 06/10/22  1005      History   Chief Complaint Chief Complaint  Patient presents with   Abscess    HPI [name] is a 61 y.o. male.   Patient presents urgent care for evaluation of abscess to the right axillary region that he has had for the last 2 weeks.  He was seen for his abscess on June 02, 2022 where the abscess was found to be large but firm.  Abscess was red and warm at time of appointment 4 days ago and patient was placed on Bactrim antibiotic.  Patient has been taking Bactrim as prescribed and has 2-1/2 days left of antibiotic.  He has also been applying warm compresses to soften the infected material to the abscess.  He states that the abscess started to drain pus and blood this morning but continues to be large, swollen, painful, and warm.  He has never had an abscess of this type in the past.  He was referred to a surgeon at the time of his last visit and has an appointment on June 14, 2022 for further evaluation and management of abscess.  Patient is on Eliquis blood thinner and has multiple comorbidities.   Abscess   Past Medical History:  Diagnosis Date   Anemia    Arthritis    Back pain    CAD (coronary artery disease)    a. s/p DES to LAD in 05/2016   Cervical radiculopathy    Chronic diastolic CHF (congestive heart failure) (HCC)    Chronic pain    Depression    DVT (deep venous thrombosis) (HCC)    Hematemesis    Hepatic steatosis    Hyperlipidemia    Hypertension    IBS (irritable bowel syndrome)    Morbid obesity (HCC)    OSA (obstructive sleep apnea)    Pancreatitis    PE (pulmonary thromboembolism) ([place])    Peripheral neuropathy    PUD (peptic ulcer disease)    Renal disorder    Stroke [facility])    a. ?details unclear - not seen on imaging when he was admitted in 05/2017 for TIA symptoms which were felt due to cervical radiculopathy.   Thoracic aortic ectasia (HCC)    a.  4.3cm ectatic ascending thoracic aorta by CT 06/2017.    Type 2 diabetes mellitus ([place])     Patient Active Problem List   Diagnosis Date Noted   Acute metabolic encephalopathy 08/26/1593   Blood per rectum 03/22/2022   Acute upper GI bleed 03/22/2022   Acute kidney injury ([place]) 01/12/2022   GI bleed 07/15/2021   Pulmonary embolism ([place]) 07/15/2021   Acute pulmonary embolism ([place]) 06/26/2021   Diabetic ulcer of lower leg ([place]) 06/26/2021   Acute respiratory failure with hypoxia ([place]) 06/26/2021   COVID-19 virus infection 06/26/2021   Lumbar radiculitis 03/30/2021   Morbid obesity ([place]) 01/15/2021   Personal history of colonic polyps 01/15/2021   Rectal bleeding 01/15/2021   Pain due to onychomycosis of toenails of both feet 01/15/2021   Epistaxis, recurrent 10/21/2020   Laceration of nose 10/21/2020   Syncope 10/15/2020   OSA (obstructive sleep apnea)    Bilateral carotid artery stenosis 07/28/2020   Bilateral lower extremity edema 07/28/2020   CHF (congestive heart failure) ([place]) 07/28/2020   DDD (degenerative disc disease), lumbar 07/28/2020   Diabetic peripheral neuropathy ([place]) 07/28/2020   Fatty liver 07/28/2020   Irritable bowel syndrome with  diarrhea 07/28/2020   Osteoarthritis of right hip 07/28/2020   Upper GI bleed 04/28/2020   Hypertensive urgency 04/27/2020   Acute GI bleeding 04/27/2020   Mixed diabetic hyperlipidemia associated with type 2 diabetes mellitus ([place]) 04/27/2020   Rotator cuff arthropathy 10/30/2019   Cervical radiculopathy 09/24/2019   Trochanteric bursitis of right hip 09/24/2019   Chronic diastolic CHF (congestive heart failure) ([place]) 08/23/2019   ARF (acute renal failure) ([place]) 08/01/2019   Hyperkalemia 08/01/2019   Uncontrolled type 2 diabetes mellitus with hyperglycemia ([place]) 08/01/2019   AKI (acute kidney injury) ([place])    Hypotension 07/31/2019   DKA (diabetic ketoacidosis) ([place]) 05/05/2019   Blurred vision 05/05/2019   Trochanteric  bursitis 02/11/2019   Long-term insulin use ([place]) 01/25/2019   Alteration consciousness 11/19/2018   Neck pain 10/09/2018   Left arm weakness 10/09/2018   B12 deficiency 08/10/2017   Persistent headaches 08/09/2017   GERD (gastroesophageal reflux disease) 06/29/2017   Left arm numbness    History of CVA (cerebrovascular accident) 06/17/2017   TIA (transient ischemic attack) 06/17/2017   Chronic back pain 12/29/2016   Depression 12/15/2016   Vitamin D deficiency 12/09/2016   Right hip pain 12/07/2016   Hypokalemia 12/07/2016   Type 2 diabetes mellitus with vascular disease ([place]) 05/31/2016   Normocytic normochromic anemia 05/31/2016   Chest pain 05/31/2016   Coronary artery disease involving native coronary artery of native heart without angina pectoris 01/06/2016   Lactic acidosis 05/28/2014   Nonspecific chest pain 01/29/2014   DM2 (diabetes mellitus, type 2) ([place]) 01/29/2014   Obesity, Class III, BMI 40-49.9 (morbid obesity) ([place]) 01/29/2014   Snoring 01/29/2014   Dyslipidemia 01/29/2014   HTN (hypertension) 01/29/2014   Abnormal nuclear stress test 01/29/2014    Past Surgical History:  Procedure Laterality Date   CARDIAC CATHETERIZATION N/A 05/31/2016   Procedure: Left Heart Cath and Coronary Angiography;  Surgeon: Peter M Martinique, MD;  Location: [place] [facility];  Service: Cardiovascular;  Laterality: N/A;   CARDIAC CATHETERIZATION N/A 05/31/2016   Procedure: Intravascular Pressure Wire/FFR Study;  Surgeon: Peter M Martinique, MD;  Location: [place] [facility];  Service: Cardiovascular;  Laterality: N/A;   CARDIAC CATHETERIZATION N/A 05/31/2016   Procedure: Coronary Stent Intervention;  Surgeon: Peter M Martinique, MD;  Location: [place] [facility] LAB;  Service: Cardiovascular;  Laterality: N/A;   COLONOSCOPY N/A 07/17/2021   Procedure: COLONOSCOPY;  Surgeon: Wilford Corner, MD;  Location: [facility] END[facility];  Service: Endoscopy;  Laterality: N/A;   COLONOSCOPY WITH PROPOFOL N/A 04/29/2020    Procedure: COLONOSCOPY WITH PROPOFOL;  Surgeon: Wilford Corner, MD;  Location: [place];  Service: Endoscopy;  Laterality: N/A;   ESOPHAGOGASTRODUODENOSCOPY N/A 04/29/2020   Procedure: ESOPHAGOGASTRODUODENOSCOPY (EGD);  Surgeon: Wilford Corner, MD;  Location: [place];  Service: Endoscopy;  Laterality: N/A;   ESOPHAGOGASTRODUODENOSCOPY (EGD) WITH PROPOFOL N/A 07/17/2021   Procedure: ESOPHAGOGASTRODUODENOSCOPY (EGD) WITH PROPOFOL;  Surgeon: Wilford Corner, MD;  Location: [facility];  Service: Endoscopy;  Laterality: N/A;   LEFT HEART CATH AND CORONARY ANGIOGRAPHY N/A 12/08/2017   Procedure: LEFT HEART CATH AND CORONARY ANGIOGRAPHY;  Surgeon: Leonie Man, MD;  Location: [place] [facility];  Service: Cardiovascular;  Laterality: N/A;   LEFT HEART CATHETERIZATION WITH CORONARY ANGIOGRAM N/A 02/03/2014   Procedure: LEFT HEART CATHETERIZATION WITH CORONARY ANGIOGRAM;  Surgeon: Pixie Casino, MD;  Location: [facility] CATH LAB;  Service: Cardiovascular;  Laterality: N/A;   left leg stent      POLYPECTOMY  04/29/2020   Procedure: POLYPECTOMY;  Surgeon: Wilford Corner, MD;  Location: [facility] ENDOSCOPY;  Service: Endoscopy;;   POLYPECTOMY  07/17/2021   Procedure: POLYPECTOMY;  Surgeon: Wilford Corner, MD;  Location: [facility] END[facility];  Service: Endoscopy;;       Home Medications    Prior to Admission medications   Medication Sig Start Date End Date Taking? Authorizing Provider  amoxicillin-clavulanate (AUGMENTIN) 875-125 MG tablet Take 1 tablet by mouth every 12 (twelve) hours. 06/10/22  Yes Talbot Grumbling, FNP  apixaban (ELIQUIS) 5 MG TABS tablet Take 1 tablet (5 mg total) by mouth 2 (two) times daily. 03/30/22  Yes Hosie Poisson, MD  atorvastatin (LIPITOR) 40 MG tablet Take 40 mg by mouth at bedtime.   Yes [provider]  carvedilol (COREG) 6.25 MG tablet Take 1 tablet (6.25 mg total) by mouth 2 (two) times daily with a meal. 03/26/22  Yes Hosie Poisson, MD  cyanocobalamin 1000  MCG tablet Take 1,000 mcg by mouth daily. 03/06/20  Yes [provider]  diclofenac (FLECTOR) 1.3 % PTCH Place 1 patch onto the skin 2 (two) times daily as needed (back and knee pain).   Yes [provider]  fenofibrate (TRICOR) 145 MG tablet TAKE 1 TABLET (145 MG TOTAL) BY MOUTH DAILY. 05/19/22  Yes Cleaver, Jossie Ng, NP  furosemide (LASIX) 40 MG tablet Take 40 mg by mouth 2 (two) times daily.   Yes [provider]  gabapentin (NEURONTIN) 800 MG tablet Take 800 mg by mouth 3 (three) times daily. 09/29/21  Yes [provider]  glipiZIDE (GLUCOTROL) 10 MG tablet Take 10 mg by mouth 2 (two) times daily. 05/30/17  Yes [provider]  HUMULIN R U-500 KWIKPEN 500 UNIT/ML kwikpen Inject 85-110 Units into the skin See admin instructions. Per sliding scale 3 times daily with meals 06/04/20  Yes [provider]  isosorbide mononitrate (IMDUR) 30 MG 24 hr tablet TAKE 1 TABLET BY MOUTH DAILY Patient taking differently: Take 30 mg by mouth daily. 01/03/22  Yes Hilty, Nadean Corwin, MD  lisinopril (ZESTRIL) 20 MG tablet Take 20 mg by mouth daily.   Yes [provider]  ondansetron (ZOFRAN) 4 MG tablet Take 4 mg by mouth 2 (two) times daily as needed for nausea/vomiting, vomiting or nausea. 04/21/20  Yes [provider]  Oxycodone HCl 20 MG TABS Take 20 mg by mouth 4 (four) times daily as needed (pain).   Yes [provider]  pantoprazole (PROTONIX) 40 MG tablet Take 1 tablet by mouth daily. 05/26/22  Yes [provider]  potassium chloride (MICRO-K) 10 MEQ CR capsule Take 20 mEq by mouth 2 (two) times daily. 04/03/20  Yes [provider]  sertraline (ZOLOFT) 50 MG tablet Take 50 mg by mouth every morning. 12/17/20  Yes [provider]  sulfamethoxazole-trimethoprim (BACTRIM DS) 800-160 MG tablet Take 1 tablet by mouth 2 (two) times daily for 7 days. 06/06/22 06/13/22 Yes Nyoka Lint, PA-C  tiZANidine (ZANAFLEX) 4 MG tablet  Take 4 mg by mouth at bedtime as needed for muscle spasms. 03/31/20  Yes [provider]  traZODone (DESYREL) 50 MG tablet Take 1 tablet (50 mg total) by mouth at bedtime. 12/15/16  Yes Burns, Arloa Koh, MD  Vitamin D, Ergocalciferol, (DRISDOL) 1.25 MG (50000 UT) CAPS capsule Take 50,000 Units by mouth every Monday. 08/15/19  Yes [provider]  ACCU-CHEK GUIDE test strip  05/19/22   [provider]  albuterol (VENTOLIN HFA) 108 (90 Base) MCG/ACT inhaler Inhale 2 puffs into the lungs every 6 (  six) hours as needed for wheezing or shortness of breath.    [provider]  apixaban (ELIQUIS) 5 MG TABS tablet Take 2 tablets (10 mg total) by mouth 2 (two) times daily for 3 days. Patient taking differently: Take 5 mg by mouth 2 (two) times daily. 03/26/22 05/01/22  Hosie Poisson, MD  Continuous Blood Gluc Sensor (FREESTYLE LIBRE 2 SENSOR) MISC  05/02/22   [provider]  Fluticasone Propionate (FLONASE ALLERGY RELIEF NA) Place 1 spray into the nose daily as needed (congestion).    [provider]  Insulin Pen Needle 31G X 5 MM MISC Use 1 needle daily to inject insulin as prescribed 06/18/17   Barton Dubois, MD  Menthol, Topical Analgesic, (BIOFREEZE EX) Apply 1 application topically 2 (two) times daily as needed (knee pain).    [provider]  nitroGLYCERIN (NITROSTAT) 0.4 MG SL tablet Place 1 tablet (0.4 mg total) under the tongue every 5 (five) minutes as needed for chest pain. 02/06/19   Skeet Latch, MD    Family History Family History  Problem Relation Age of Onset   Cancer Father    Hypertension Mother    Diabetes Mother    Breast cancer Mother    Hypertension Brother    Diabetes Brother    Hypertension Sister    Diabetes Sister     Social History Social History   Tobacco Use   Smoking status: Never   Smokeless tobacco: Never  Vaping Use   Vaping Use: Never used  Substance Use Topics   Alcohol use: No   Drug use: No      Allergies   Coconut flavor [flavoring agent], Ibuprofen, Aleve [naproxen], Nsaids, Other, and Vascepa [icosapent ethyl]   Review of Systems Review of Systems Per HPI  Physical Exam Triage Vital Signs ED Triage Vitals  Enc Vitals Group     BP 06/10/22 1023 128/62     Pulse Rate 06/10/22 1023 64     Resp 06/10/22 1023 16     Temp 06/10/22 1023 98.2 F (36.8 C)     Temp Source 06/10/22 1023 Oral     SpO2 06/10/22 1023 92 %     Weight 06/10/22 1025 (!) 325 lb (147.4 kg)     Height 06/10/22 1025 '5\' 10"'$  (1.778 m)     Head Circumference --      Peak Flow --      Pain Score 06/10/22 1023 6     Pain Loc --      Pain Edu? --      Excl. in [place]? --    No data found.  Updated Vital Signs BP 128/62 (BP Location: Left Wrist)   Pulse 64   Temp 98.2 F (36.8 C) (Oral)   Resp 16   Ht '5\' 10"'$  (1.778 m)   Wt (!) 325 lb (147.4 kg)   SpO2 92%   BMI 46.63 kg/m   Visual Acuity Right Eye Distance:   Left Eye Distance:   Bilateral Distance:    Right Eye Near:   Left Eye Near:    Bilateral Near:     Physical Exam Vitals and nursing note reviewed.  Constitutional:      Appearance: Normal appearance. He is not ill-appearing or toxic-appearing.     Comments: Very pleasant patient sitting on exam in position of comfort table in no acute distress.   HENT:     Head: Normocephalic and atraumatic.     Right Ear: Hearing and external ear normal.  Left Ear: Hearing and external ear normal.     Nose: Nose normal.     Mouth/Throat:     Lips: Pink.     Mouth: Mucous membranes are moist.  Eyes:     General: Lids are normal. Vision grossly intact. Gaze aligned appropriately.     Extraocular Movements: Extraocular movements intact.     Conjunctiva/sclera: Conjunctivae normal.  Pulmonary:     Effort: Pulmonary effort is normal.  Abdominal:     Palpations: Abdomen is soft.  Musculoskeletal:     Cervical back: Neck supple.  Skin:    General: Skin is warm and dry.     Capillary  Refill: Capillary refill takes less than 2 seconds.     Findings: No rash.     Comments: 8 cm x 4 cm abscess present to the right inferior axilla.  Drainage from the abscess is purulent and bloody.  Abscess is warm, swollen, and tender.  Large area of fluctuance palpated.  Neurological:     General: No focal deficit present.     Mental Status: He is alert and oriented to person, place, and time. Mental status is at baseline.     Cranial Nerves: No dysarthria or facial asymmetry.     Gait: Gait is intact.  Psychiatric:        Mood and Affect: Mood normal.        Speech: Speech normal.        Behavior: Behavior normal.        Thought Content: Thought content normal.        Judgment: Judgment normal.      UC Treatments / Results  Labs (all labs ordered are listed, but only abnormal results are displayed) Labs Reviewed - No data to display  EKG   Radiology No results found.  Procedures Incision and Drainage  Date/Time: 06/10/2022 11:23 AM  Performed by: Talbot Grumbling, FNP Authorized by: Talbot Grumbling, FNP   Consent:    Consent obtained:  Verbal   Consent given by:  Patient   Risks discussed:  Bleeding, incomplete drainage, pain, damage to other organs and infection   Alternatives discussed:  No treatment Universal protocol:    Patient identity confirmed:  Verbally with patient Location:    Type:  Abscess   Size:  8cm x 4cm   Location:  Upper extremity (Right inferior axilla) Pre-procedure details:    Skin preparation:  Povidone-iodine Sedation:    Sedation type:  None Anesthesia:    Anesthesia method:  Local infiltration   Local anesthetic:  Lidocaine 1% WITH epi Procedure type:    Complexity:  Simple Procedure details:    Incision types:  Stab incision (2 stab incisions made to the abscess)   Wound management:  Probed and deloculated, irrigated with saline and extensive cleaning   Drainage:  Purulent and bloody   Drainage amount:  Copious    Wound treatment:  Wound left open   Packing materials:  None Post-procedure details:    Procedure completion:  Tolerated well, no immediate complications  (including critical care time)  Medications Ordered in UC Medications - No data to display  Initial Impression / Assessment and Plan / UC Course  I have reviewed the triage vital signs and the nursing notes.  Pertinent labs & imaging results that were available during my care of the patient were reviewed by me and considered in my medical decision making (see chart for details).   1.  Abscess of axilla, right  Abscess drained in the clinic today.  See procedure note for further detail.  Abscess left open to continue to drain.  Wound cleansed and dressed in the clinic with nonstick gauze and tape.  Pressure held to the wound for hemostasis as patient is on blood thinner Eliquis.  Bleeding well controlled.  Patient tolerated procedure well without difficulty.  He is to change dressings twice daily with nonstick gauze and tape.  Advised to continue warm compresses and gentle massage to the area in the shower to allow abscess to continue to drain.  Follow-up with surgeon on June 14, 2022 as scheduled for possible surgical excision of cyst.  Patient placed on Augmentin antibiotic twice daily for 7 days for additional antibacterial coverage due to significant amount of infected material. Patient agreeable with this plan.  Discussed physical exam and available lab work findings in clinic with patient.  Counseled patient regarding appropriate use of medications and potential side effects for all medications recommended or prescribed today. Discussed red flag signs and symptoms of worsening condition,when to call the PCP office, return to urgent care, and when to seek higher level of care in the emergency department. Patient verbalizes understanding and agreement with plan. All questions answered. Patient discharged in stable condition.  Final Clinical  Impressions(s) / UC Diagnoses   Final diagnoses:  Abscess of axilla, right     Discharge Instructions      We drained your cyst today in the clinic and left open to continue to drain.  Continue to use warm compresses to the wound to further encourage drainage from the wound.  You may do this in the shower as well with gentle compresses to the area to allow more infected material to drain.    Take augmentin antibiotic 2 times daily for the next 7 days to treat further infection. Finish your course of Bactrim antibiotic as prescribed.  Change your dressings twice daily as the wound heals.  Do not apply any ointments, lotions, or powders to the wound.  Follow-up with the surgeon on August 22 as previously planned.  Return to urgent care in the next few days if you notice worsening bleeding, fever/chills, or any other new or worsening symptoms.     ED Prescriptions     Medication Sig Dispense Auth. Provider   amoxicillin-clavulanate (AUGMENTIN) 875-125 MG tablet Take 1 tablet by mouth every 12 (twelve) hours. 14 tablet Talbot Grumbling, FNP      PDMP not reviewed this encounter.   Talbot Grumbling, [place] 06/10/22 1128

## 2022-06-10 NOTE — Discharge Instructions (Addendum)
We drained your cyst today in the clinic and left open to continue to drain.  Continue to use warm compresses to the wound to further encourage drainage from the wound.  You may do this in the shower as well with gentle compresses to the area to allow more infected material to drain.    Take augmentin antibiotic 2 times daily for the next 7 days to treat further infection. Finish your course of Bactrim antibiotic as prescribed.  Change your dressings twice daily as the wound heals.  Do not apply any ointments, lotions, or powders to the wound.  Follow-up with the surgeon on August 22 as previously planned.  Return to urgent care in the next few days if you notice worsening bleeding, fever/chills, or any other new or worsening symptoms.

## 2022-06-10 NOTE — ED Triage Notes (Signed)
Patient states he was seen for an abscess a few days ago. Has been taking an anti-biotics and using compresses. States the abscess burst, there was puss and blood every where.

## 2022-06-14 ENCOUNTER — Encounter: Payer: Self-pay | Admitting: Surgery

## 2022-06-14 ENCOUNTER — Ambulatory Visit: Payer: Medicaid Other | Admitting: Surgery

## 2022-06-14 VITALS — BP 150/90 | HR 77 | Temp 98.4°F | Ht 70.0 in | Wt 347.0 lb

## 2022-06-14 DIAGNOSIS — L02411 Cutaneous abscess of right axilla: Secondary | ICD-10-CM | POA: Diagnosis not present

## 2022-06-14 DIAGNOSIS — L02419 Cutaneous abscess of limb, unspecified: Secondary | ICD-10-CM | POA: Insufficient documentation

## 2022-06-14 NOTE — Progress Notes (Signed)
Patient ID: [name], male   DOB: 05-Jun-1961, 61 y.o.   MRN: [id number]  Chief Complaint: Right axillary abscess  History of Present Illness [name] is a 61 y.o. male with right axillary cellulitis with abscess, beginning earlier this month progressing.  Seen August 14 and subsequently again on the 18th where he underwent an I&D and had a change in his antibiotics to Augmentin.  Currently reports some improvement denying fevers and chills.  Reports his blood sugars had been out of control but currently in the low 100s.  Pain is significantly improved.  No packing has been employed to this point.  Changing cover dressing daily.  Currently on Eliquis, high risk for emboli, and therefore reluctant to withhold.  Past Medical History Past Medical History:  Diagnosis Date   Anemia    Arthritis    Back pain    CAD (coronary artery disease)    a. s/p DES to LAD in 05/2016   Cervical radiculopathy    Chronic diastolic CHF (congestive heart failure) (HCC)    Chronic pain    Depression    DVT (deep venous thrombosis) (HCC)    Hematemesis    Hepatic steatosis    Hyperlipidemia    Hypertension    IBS (irritable bowel syndrome)    Morbid obesity (HCC)    OSA (obstructive sleep apnea)    Pancreatitis    PE (pulmonary thromboembolism) ([place])    Peripheral neuropathy    PUD (peptic ulcer disease)    Renal disorder    Stroke [facility])    a. ?details unclear - not seen on imaging when he was admitted in 05/2017 for TIA symptoms which were felt due to cervical radiculopathy.   Thoracic aortic ectasia (HCC)    a. 4.3cm ectatic ascending thoracic aorta by CT 06/2017.    Type 2 diabetes mellitus ([place])       Past Surgical History:  Procedure Laterality Date   CARDIAC CATHETERIZATION N/A 05/31/2016   Procedure: Left Heart Cath and Coronary Angiography;  Surgeon: Peter M Martinique, MD;  Location: [place] [facility];  Service: Cardiovascular;  Laterality: N/A;   CARDIAC CATHETERIZATION N/A  05/31/2016   Procedure: Intravascular Pressure Wire/FFR Study;  Surgeon: Peter M Martinique, MD;  Location: [place] [facility];  Service: Cardiovascular;  Laterality: N/A;   CARDIAC CATHETERIZATION N/A 05/31/2016   Procedure: Coronary Stent Intervention;  Surgeon: Peter M Martinique, MD;  Location: [place] [facility];  Service: Cardiovascular;  Laterality: N/A;   COLONOSCOPY N/A 07/17/2021   Procedure: COLONOSCOPY;  Surgeon: Wilford Corner, MD;  Location: [facility] ENDOS[facility];  Service: Endoscopy;  Laterality: N/A;   COLONOSCOPY WITH PROPOFOL N/A 04/29/2020   Procedure: COLONOSCOPY WITH PROPOFOL;  Surgeon: Wilford Corner, MD;  Location: [place];  Service: Endoscopy;  Laterality: N/A;   ESOPHAGOGASTRODUODENOSCOPY N/A 04/29/2020   Procedure: ESOPHAGOGASTRODUODENOSCOPY (EGD);  Surgeon: Wilford Corner, MD;  Location: [place];  Service: Endoscopy;  Laterality: N/A;   ESOPHAGOGASTRODUODENOSCOPY (EGD) WITH PROPOFOL N/A 07/17/2021   Procedure: ESOPHAGOGASTRODUODENOSCOPY (EGD) WITH PROPOFOL;  Surgeon: Wilford Corner, MD;  Location: [facility]OS[facility];  Service: Endoscopy;  Laterality: N/A;   LEFT HEART CATH AND CORONARY ANGIOGRAPHY N/A 12/08/2017   Procedure: LEFT HEART CATH AND CORONARY ANGIOGRAPHY;  Surgeon: Leonie Man, MD;  Location: [place] [facility];  Service: Cardiovascular;  Laterality: N/A;   LEFT HEART CATHETERIZATION WITH CORONARY ANGIOGRAM N/A 02/03/2014   Procedure: LEFT HEART CATHETERIZATION WITH CORONARY ANGIOGRAM;  Surgeon: Pixie Casino, MD;  Location: [facility]  CATH LAB;  Service: Cardiovascular;  Laterality: N/A;   left leg stent      POLYPECTOMY  04/29/2020   Procedure: POLYPECTOMY;  Surgeon: Wilford Corner, MD;  Location: [facility] ENDOSCOPY;  Service: Endoscopy;;   POLYPECTOMY  07/17/2021   Procedure: POLYPECTOMY;  Surgeon: Wilford Corner, MD;  Location: [facility] ENDOSCOPY;  Service: Endoscopy;;    Allergies  Allergen Reactions   Coconut Flavor [Flavoring Agent] Hives   Ibuprofen Other (See  Comments)    Made gastric ulcers worse   Aleve [Naproxen] Other (See Comments)    DUE TO KIDNEYS   Nsaids Other (See Comments)    Stomach ulcers    Other Hives    Nut Allergy   Vascepa [Icosapent Ethyl] Other (See Comments)    headaches, chest pain, similar to sx of a stroke, hypotension     Current Outpatient Medications  Medication Sig Dispense Refill   ACCU-CHEK GUIDE test strip      albuterol (VENTOLIN HFA) 108 (90 Base) MCG/ACT inhaler Inhale 2 puffs into the lungs every 6 (six) hours as needed for wheezing or shortness of breath.     amoxicillin-clavulanate (AUGMENTIN) 875-125 MG tablet Take 1 tablet by mouth every 12 (twelve) hours. 14 tablet 0   apixaban (ELIQUIS) 5 MG TABS tablet Take 2 tablets (10 mg total) by mouth 2 (two) times daily for 3 days. (Patient taking differently: Take 5 mg by mouth 2 (two) times daily.) 12 tablet 0   apixaban (ELIQUIS) 5 MG TABS tablet Take 1 tablet (5 mg total) by mouth 2 (two) times daily. 60 tablet 2   atorvastatin (LIPITOR) 40 MG tablet Take 40 mg by mouth at bedtime.     carvedilol (COREG) 6.25 MG tablet Take 1 tablet (6.25 mg total) by mouth 2 (two) times daily with a meal. 60 tablet 2   Continuous Blood Gluc Sensor (FREESTYLE LIBRE 2 SENSOR) MISC      cyanocobalamin 1000 MCG tablet Take 1,000 mcg by mouth daily.     diclofenac (FLECTOR) 1.3 % PTCH Place 1 patch onto the skin 2 (two) times daily as needed (back and knee pain).     fenofibrate (TRICOR) 145 MG tablet TAKE 1 TABLET (145 MG TOTAL) BY MOUTH DAILY. 90 tablet 3   Fluticasone Propionate (FLONASE ALLERGY RELIEF NA) Place 1 spray into the nose daily as needed (congestion).     furosemide (LASIX) 40 MG tablet Take 40 mg by mouth 2 (two) times daily.     gabapentin (NEURONTIN) 800 MG tablet Take 800 mg by mouth 3 (three) times daily.     glipiZIDE (GLUCOTROL) 10 MG tablet Take 10 mg by mouth 2 (two) times daily.  4   HUMULIN R U-500 KWIKPEN 500 UNIT/ML kwikpen Inject 85-110 Units into  the skin See admin instructions. Per sliding scale 3 times daily with meals     Insulin Pen Needle 31G X 5 MM MISC Use 1 needle daily to inject insulin as prescribed 100 each 2   isosorbide mononitrate (IMDUR) 30 MG 24 hr tablet TAKE 1 TABLET BY MOUTH DAILY (Patient taking differently: Take 30 mg by mouth daily.) 28 tablet 12   lisinopril (ZESTRIL) 20 MG tablet Take 20 mg by mouth daily.     Menthol, Topical Analgesic, (BIOFREEZE EX) Apply 1 application topically 2 (two) times daily as needed (knee pain).     nitroGLYCERIN (NITROSTAT) 0.4 MG SL tablet Place 1 tablet (0.4 mg total) under the tongue every 5 (five) minutes as needed for chest pain.  100 tablet 1   ondansetron (ZOFRAN) 4 MG tablet Take 4 mg by mouth 2 (two) times daily as needed for nausea/vomiting, vomiting or nausea.     Oxycodone HCl 20 MG TABS Take 20 mg by mouth 4 (four) times daily as needed (pain).     pantoprazole (PROTONIX) 40 MG tablet Take 1 tablet by mouth daily.     potassium chloride (MICRO-K) 10 MEQ CR capsule Take 20 mEq by mouth 2 (two) times daily.     sertraline (ZOLOFT) 50 MG tablet Take 50 mg by mouth every morning.     tiZANidine (ZANAFLEX) 4 MG tablet Take 4 mg by mouth at bedtime as needed for muscle spasms.     traZODone (DESYREL) 50 MG tablet Take 1 tablet (50 mg total) by mouth at bedtime. 30 tablet 11   Vitamin D, Ergocalciferol, (DRISDOL) 1.25 MG (50000 UT) CAPS capsule Take 50,000 Units by mouth every Monday.     No current facility-administered medications for this visit.    Family History Family History  Problem Relation Age of Onset   Cancer Father    Hypertension Mother    Diabetes Mother    Breast cancer Mother    Hypertension Brother    Diabetes Brother    Hypertension Sister    Diabetes Sister       Social History Social History   Tobacco Use   Smoking status: Never   Smokeless tobacco: Never  Vaping Use   Vaping Use: Never used  Substance Use Topics   Alcohol use: No   Drug  use: No        Review of Systems  Constitutional:  Positive for diaphoresis and malaise/fatigue.  HENT:  Positive for nosebleeds.   Eyes:  Positive for blurred vision.  Respiratory:  Positive for shortness of breath.   Cardiovascular:  Positive for chest pain, orthopnea and leg swelling.  Gastrointestinal:  Positive for diarrhea, nausea and vomiting.  Genitourinary: Negative.   Skin: Negative.   Neurological:  Positive for dizziness, tingling and headaches.  Psychiatric/Behavioral:  Positive for depression.       Physical Exam There were no vitals taken for this visit.   CONSTITUTIONAL: Well developed, and nourished, appropriately responsive and aware without distress.  Morbidly obese male appearing and nontoxic. EYES: Sclera non-icteric.   EARS, NOSE, MOUTH AND THROAT:  The oropharynx is clear. Oral mucosa is pink and moist.    Hearing is intact to voice.  NECK: Trachea is midline LYMPH NODES:  Lymph nodes in the neck are not appreciated. RESPIRATORY:  Normal respiratory effort without pathologic use of accessory muscles. CARDIOVASCULAR: Heart is regular in rate and rhythm. GI: The abdomen is well rounded/obese otherwise soft, nontender. MUSCULOSKELETAL:  Symmetrical muscle tone appreciated in all four extremities.    SKIN: Skin turgor is normal. No pathologic skin lesions appreciated.  There is a redundant axillary pannus which is primarily uninvolved, cephalad to this closer to the axilla there is multiple draining slits approximately 3.  No evidence of active induration, or malodor.  There is serous drainage from the sites.  With a sterile Q-tip I probed to these areas finding a significant depth and the smaller area without localizing purulent discharge.  Due to the amount of bleeding and oozing that was produced just from this manipulation, I felt it prudent to utilize some quarter inch packing strip with iodoform to begin packing 2 of these areas that I believe are sufficient  to drain the process.  He tolerated  this quite well.  However has no means or assistance to proceed and repeat this at home.  I have suggested then that we at least withdraw the packing gradually on a daily basis and then will reevaluate in 48 hours to repack here in the office.  Should continue his oral antibiotics we will see him back in 2 days. NEUROLOGIC:  Motor and sensation appear grossly normal.  Cranial nerves are grossly without defect. PSYCH:  Alert and oriented to person, place and time. Affect is appropriate for situation.  Data Reviewed I have personally reviewed what is currently available of the patient's imaging, recent labs and medical records.   Labs:     Latest Ref Rng & Units 05/01/2022    1:45 PM 03/26/2022    3:19 AM 03/25/2022    1:24 AM  CBC  WBC 4.0 - 10.5 K/uL 7.6  8.1  8.2   Hemoglobin 13.0 - 17.0 g/dL 10.1  10.1  10.3   Hematocrit 39.0 - 52.0 % 31.2  29.7  31.4   Platelets 150 - 400 K/uL 233  212  208       Latest Ref Rng & Units 05/01/2022    1:45 PM 03/26/2022    3:19 AM 03/25/2022    1:24 AM  CMP  Glucose 70 - 99 mg/dL 178  236  224   BUN 6 - 20 mg/dL '10  17  17   '$ Creatinine 0.61 - 1.24 mg/dL 0.71  1.19  1.24   Sodium 135 - 145 mmol/L 143  139  140   Potassium 3.5 - 5.1 mmol/L 3.7  3.8  3.8   Chloride 98 - 111 mmol/L 108  103  106   CO2 22 - 32 mmol/L '27  26  24   '$ Calcium 8.9 - 10.3 mg/dL 9.1  9.7  9.7      I do not see any culture results from prior I&D's.  Within last 24 hrs: No results found.  Assessment    Right axillary cellulitis/abscesses. Patient Active Problem List   Diagnosis Date Noted   Acute metabolic encephalopathy 82/99/3716   Blood per rectum 03/22/2022   Acute upper GI bleed 03/22/2022   Acute kidney injury ([place]) 01/12/2022   GI bleed 07/15/2021   Pulmonary embolism ([place]) 07/15/2021   Acute pulmonary embolism ([place]) 06/26/2021   Diabetic ulcer of lower leg ([place]) 06/26/2021   Acute respiratory failure with hypoxia ([place]) 06/26/2021    COVID-19 virus infection 06/26/2021   Lumbar radiculitis 03/30/2021   Morbid obesity ([place]) 01/15/2021   Personal history of colonic polyps 01/15/2021   Rectal bleeding 01/15/2021   Pain due to onychomycosis of toenails of both feet 01/15/2021   Epistaxis, recurrent 10/21/2020   Laceration of nose 10/21/2020   Syncope 10/15/2020   OSA (obstructive sleep apnea)    Bilateral carotid artery stenosis 07/28/2020   Bilateral lower extremity edema 07/28/2020   CHF (congestive heart failure) ([place]) 07/28/2020   DDD (degenerative disc disease), lumbar 07/28/2020   Diabetic peripheral neuropathy ([place]) 07/28/2020   Fatty liver 07/28/2020   Irritable bowel syndrome with diarrhea 07/28/2020   Osteoarthritis of right hip 07/28/2020   Upper GI bleed 04/28/2020   Hypertensive urgency 04/27/2020   Acute GI bleeding 04/27/2020   Mixed diabetic hyperlipidemia associated with type 2 diabetes mellitus ([place]) 04/27/2020   Rotator cuff arthropathy 10/30/2019   Cervical radiculopathy 09/24/2019   Trochanteric bursitis of right hip 09/24/2019   Chronic diastolic CHF (congestive heart failure) ([place]) 08/23/2019  ARF (acute renal failure) ([place]) 08/01/2019   Hyperkalemia 08/01/2019   Uncontrolled type 2 diabetes mellitus with hyperglycemia ([place]) 08/01/2019   AKI (acute kidney injury) ([place])    Hypotension 07/31/2019   DKA (diabetic ketoacidosis) ([place]) 05/05/2019   Blurred vision 05/05/2019   Trochanteric bursitis 02/11/2019   Long-term insulin use ([place]) 01/25/2019   Alteration consciousness 11/19/2018   Neck pain 10/09/2018   Left arm weakness 10/09/2018   B12 deficiency 08/10/2017   Persistent headaches 08/09/2017   GERD (gastroesophageal reflux disease) 06/29/2017   Left arm numbness    History of CVA (cerebrovascular accident) 06/17/2017   TIA (transient ischemic attack) 06/17/2017   Chronic back pain 12/29/2016   Depression 12/15/2016   Vitamin D deficiency 12/09/2016   Right hip pain  12/07/2016   Hypokalemia 12/07/2016   Type 2 diabetes mellitus with vascular disease ([place]) 05/31/2016   Normocytic normochromic anemia 05/31/2016   Chest pain 05/31/2016   Coronary artery disease involving native coronary artery of native heart without angina pectoris 01/06/2016   Lactic acidosis 05/28/2014   Nonspecific chest pain 01/29/2014   DM2 (diabetes mellitus, type 2) ([place]) 01/29/2014   Obesity, Class III, BMI 40-49.9 (morbid obesity) ([place]) 01/29/2014   Snoring 01/29/2014   Dyslipidemia 01/29/2014   HTN (hypertension) 01/29/2014   Abnormal nuclear stress test 01/29/2014    Plan    As recorded above, will hopefully work toward packing/wicking and wound care.  Due to his coagulopathy may be challenging/at high risk to take him off his medications to allow for more formal excision of the region.  We will see if we can continue to manage his infection/improve his wound care in a slightly compromised fashion because of his risks.  Face-to-face time spent with the patient and accompanying care providers(if present) was 45 minutes, with more than 50% of the time spent counseling, educating, and coordinating care of the patient.    These notes generated with voice recognition software. I apologize for typographical errors.  Ronny Bacon M.D., FACS 06/14/2022, 1:36 PM

## 2022-06-14 NOTE — Patient Instructions (Signed)
If the top dressing becomes saturated you may change the gauze with fresh and re tape this.    We will see you back here on Thursday to recheck and repack the area. We will determine then how often we will need to see you.   Call us if you start to develop increased pain in the area or a fever over 101.

## 2022-06-16 ENCOUNTER — Ambulatory Visit: Payer: Medicaid Other | Admitting: Surgery

## 2022-06-16 ENCOUNTER — Encounter: Payer: Self-pay | Admitting: Surgery

## 2022-06-16 VITALS — BP 178/83 | HR 68 | Temp 98.1°F | Ht 70.0 in | Wt 347.0 lb

## 2022-06-16 DIAGNOSIS — L02419 Cutaneous abscess of limb, unspecified: Secondary | ICD-10-CM

## 2022-06-16 DIAGNOSIS — L02411 Cutaneous abscess of right axilla: Secondary | ICD-10-CM | POA: Diagnosis not present

## 2022-06-16 MED ORDER — AMOXICILLIN-POT CLAVULANATE 875-125 MG PO TABS: 1.0000 | ORAL_TABLET | Freq: Two times a day (BID) | ORAL | 0 refills | Status: AC

## 2022-06-16 NOTE — Progress Notes (Signed)
Surgical Clinic Progress/Follow-up Note   HPI:  61 y.o. Male presents to clinic for right area axillary abscess follow-up 2 days follow the last evaluation. Patient reports not much improvement/resolution of prior issues and has been tolerating diet, denies N/V, fever/chills, CP, or SOB.  Review of Systems:  Constitutional: denies fever/chills  ENT: denies sore throat, hearing problems  Respiratory: denies shortness of breath, wheezing  Cardiovascular: denies chest pain, palpitations  Gastrointestinal: denies abdominal pain, N/V, or diarrhea/and bowel function as per interval history Skin: Denies any other rashes or skin discolorations as per interval history  Vital Signs:  BP (!) 178/83   Pulse 68   Temp 98.1 F (36.7 C)   Ht '5\' 10"'$  (1.778 m)   Wt (!) 347 lb (157.4 kg)   SpO2 97%   BMI 49.79 kg/m    Physical Exam:  Constitutional:  -- Obese body habitus  --Awake, alert, and oriented x3  Pulmonary:  -- Breathing non-labored at rest Cardiovascular:  -- S1, S2 present  -- No pericardial rubs  Gastrointestinal:  -- Soft and non-distended, well rounded, and non-tender  Musculoskeletal / Integumentary:  -- Wounds or skin discoloration: There was a small area of liquefactive necrosis in the right axilla, I proceeded with excising this, this allows better access to the axillary cavity, utilizing sterile Q-tips I was able to probe the cavity and retrieve multiple pieces of sebum/and sebaceous cyst content/lining.  This was done to confirm that there is no additional tunneling no additional loculations or cavities present.  Once this was completed with multiple Q-tips, I then placed a half-inch wide packing strip within the cavity utilizing iodoform packing.  I do not appreciate any other site needing packing or incision or drainage.  There remains some firmness throughout the axilla yet.  However the acute inflammatory changes/infectious changes appear to be improving.   -- Extremities:  B/L UE and LE FROM, hands and feet warm,   Laboratory studies: No evidence of any cultures pending.  Imaging: No new pertinent imaging available for review   Assessment:  61 y.o. yo Male with a problem list including...  Patient Active Problem List   Diagnosis Date Noted   Abscess of axillary fold 04/22/1600   Acute metabolic encephalopathy 09/32/3557   Blood per rectum 03/22/2022   Acute upper GI bleed 03/22/2022   Acute kidney injury ([place]) 01/12/2022   GI bleed 07/15/2021   Pulmonary embolism ([place]) 07/15/2021   Acute pulmonary embolism ([place]) 06/26/2021   Diabetic ulcer of lower leg ([place]) 06/26/2021   Acute respiratory failure with hypoxia ([place]) 06/26/2021   COVID-19 virus infection 06/26/2021   Lumbar radiculitis 03/30/2021   Morbid obesity ([place]) 01/15/2021   Personal history of colonic polyps 01/15/2021   Rectal bleeding 01/15/2021   Pain due to onychomycosis of toenails of both feet 01/15/2021   Epistaxis, recurrent 10/21/2020   Laceration of nose 10/21/2020   Syncope 10/15/2020   OSA (obstructive sleep apnea)    Bilateral carotid artery stenosis 07/28/2020   Bilateral lower extremity edema 07/28/2020   CHF (congestive heart failure) ([place]) 07/28/2020   DDD (degenerative disc disease), lumbar 07/28/2020   Diabetic peripheral neuropathy ([place]) 07/28/2020   Fatty liver 07/28/2020   Irritable bowel syndrome with diarrhea 07/28/2020   Osteoarthritis of right hip 07/28/2020   Upper GI bleed 04/28/2020   Hypertensive urgency 04/27/2020   Acute GI bleeding 04/27/2020   Mixed diabetic hyperlipidemia associated with type 2 diabetes mellitus ([place]) 04/27/2020   Rotator cuff arthropathy 10/30/2019  Cervical radiculopathy 09/24/2019   Trochanteric bursitis of right hip 09/24/2019   Chronic diastolic CHF (congestive heart failure) ([place]) 08/23/2019   ARF (acute renal failure) ([place]) 08/01/2019   Hyperkalemia 08/01/2019   Uncontrolled type 2 diabetes mellitus with hyperglycemia  ([place]) 08/01/2019   AKI (acute kidney injury) ([place])    Hypotension 07/31/2019   DKA (diabetic ketoacidosis) ([place]) 05/05/2019   Blurred vision 05/05/2019   Trochanteric bursitis 02/11/2019   Long-term insulin use ([place]) 01/25/2019   Alteration consciousness 11/19/2018   Neck pain 10/09/2018   Left arm weakness 10/09/2018   B12 deficiency 08/10/2017   Persistent headaches 08/09/2017   GERD (gastroesophageal reflux disease) 06/29/2017   Left arm numbness    History of CVA (cerebrovascular accident) 06/17/2017   TIA (transient ischemic attack) 06/17/2017   Chronic back pain 12/29/2016   Depression 12/15/2016   Vitamin D deficiency 12/09/2016   Right hip pain 12/07/2016   Hypokalemia 12/07/2016   Type 2 diabetes mellitus with vascular disease ([place]) 05/31/2016   Normocytic normochromic anemia 05/31/2016   Chest pain 05/31/2016   Coronary artery disease involving native coronary artery of native heart without angina pectoris 01/06/2016   Lactic acidosis 05/28/2014   Nonspecific chest pain 01/29/2014   DM2 (diabetes mellitus, type 2) ([place]) 01/29/2014   Obesity, Class III, BMI 40-49.9 (morbid obesity) ([place]) 01/29/2014   Snoring 01/29/2014   Dyslipidemia 01/29/2014   HTN (hypertension) 01/29/2014   Abnormal nuclear stress test 01/29/2014    presents to clinic for follow-up evaluation of right axillary abscess wound, progressing well.  Plan:              - return to clinic in a week, and to urgent care for dressing change assistance as needed, anticipating at least in 3 to 4 days.  Instructed to call office if any questions or concerns  All of the above recommendations were discussed with the patient and all of patient's questions were answered to his expressed satisfaction.  We will continue his Augmentin antibiotic for an additional week.  These notes generated with voice recognition software. I apologize for typographical errors.  Ronny Bacon, MD, FACS [place]: [facility] exceptional care. Office: [phone]

## 2022-06-16 NOTE — Patient Instructions (Addendum)
On Sunday or Monday go to Urgent Care to have them change the packing and dressing.  Follow up here on August 31st.  Every day pull a very small amount of the packing out, just a little bit and then re dress with gauze.   We have refilled your Augmentin antibiotics. Please continue these until done.    Call us if you start to develop any signs of infection.

## 2022-06-17 ENCOUNTER — Encounter: Payer: Self-pay | Admitting: Cardiology

## 2022-06-17 ENCOUNTER — Ambulatory Visit: Payer: Medicaid Other | Admitting: Cardiology

## 2022-06-17 VITALS — BP 128/68 | HR 64 | Ht 70.0 in | Wt 348.6 lb

## 2022-06-17 DIAGNOSIS — I25119 Atherosclerotic heart disease of native coronary artery with unspecified angina pectoris: Secondary | ICD-10-CM

## 2022-06-17 DIAGNOSIS — I2699 Other pulmonary embolism without acute cor pulmonale: Secondary | ICD-10-CM | POA: Diagnosis not present

## 2022-06-17 DIAGNOSIS — I10 Essential (primary) hypertension: Secondary | ICD-10-CM | POA: Diagnosis not present

## 2022-06-17 DIAGNOSIS — I5033 Acute on chronic diastolic (congestive) heart failure: Secondary | ICD-10-CM

## 2022-06-17 NOTE — Patient Instructions (Signed)
Medication Instructions:  The current medical regimen is effective;  continue present plan and medications as directed. Please refer to the Current Medication list given to you today.   *If you need a refill on your cardiac medications before your next appointment, please call your pharmacy*  Lab Work:   Testing/Procedures:  NONE    NONE If you have labs (blood work) drawn today and your tests are completely normal, you will receive your results only by:  1-MyChart Message (if you have MyChart) OR  2-A paper copy in the mail.  If you have any lab test that is abnormal or we need to change your treatment, we will call you to review the results.  Follow-Up: Your next appointment:  6 month(s) In Person with Coletta Memos, FNP, Sande Rives, PA-C, or ANY APP      Then, Kirk Ruths, MD will plan to see you again in 12 month(s)  Please call our office 2 months in advance to schedule this appointment   At [facility], you and your health needs are our priority.  As part of our continuing mission to provide you with exceptional heart care, we have created designated Provider Care Teams.  These Care Teams include your primary Cardiologist (physician) and Advanced Practice Providers (APPs -  Physician Assistants and Nurse Practitioners) who all work together to provide you with the care you need, when you need it.  Important Information About Sugar

## 2022-06-20 ENCOUNTER — Ambulatory Visit (HOSPITAL_COMMUNITY)
Admission: RE | Admit: 2022-06-20 | Discharge: 2022-06-20 | Disposition: A | Discharge status: Home / Self Care | Payer: Medicaid Other | Source: Ambulatory Visit | Attending: Internal Medicine | Admitting: Internal Medicine

## 2022-06-20 ENCOUNTER — Encounter (HOSPITAL_COMMUNITY): Payer: Self-pay

## 2022-06-20 VITALS — BP 158/89 | HR 62 | Temp 98.3°F | Resp 18

## 2022-06-20 DIAGNOSIS — L02411 Cutaneous abscess of right axilla: Secondary | ICD-10-CM

## 2022-06-20 DIAGNOSIS — Z5189 Encounter for other specified aftercare: Secondary | ICD-10-CM | POA: Diagnosis not present

## 2022-06-20 NOTE — ED Triage Notes (Signed)
Pt had abscess under right arm that he is seeing [facility] for but he need packing changed today and they advised him to have the packing changed.

## 2022-06-20 NOTE — Discharge Instructions (Addendum)
Continue daily wound dressing changes Okay to take a shower.  Please wash soap off completely should soap come into contact with the wound.  Use mild soap when taking a shower. There is no discharge or bleeding, wound packing is not indicated at this time Continue taking antibiotics as prescribed Follow-up with general surgery as scheduled Return to urgent care if you have any other concerns.

## 2022-06-21 NOTE — ED Provider Notes (Addendum)
[place]       CSN: [id number] Arrival date & time: 06/20/22  1413      History   Chief Complaint Chief Complaint  Patient presents with   Dressing Change    HPI [name] is a 61 y.o. male comes to the urgent care for dressing changes.  Patient had incision and drainage of skin abscess in the right axilla on 8/24 with the abdomen surgical group.  Patient is currently on oral antibiotics and was directed to come to urgent care for wound check.  Packing was left in place.  Discharge is decreased, pain is improved and swelling has improved as well.  No fever or chills. HPI  Past Medical History:  Diagnosis Date   Anemia    Arthritis    Back pain    CAD (coronary artery disease)    a. s/p DES to LAD in 05/2016   Cervical radiculopathy    Chronic diastolic CHF (congestive heart failure) (HCC)    Chronic pain    Depression    DVT (deep venous thrombosis) (HCC)    Hematemesis    Hepatic steatosis    Hyperlipidemia    Hypertension    IBS (irritable bowel syndrome)    Morbid obesity (HCC)    OSA (obstructive sleep apnea)    Pancreatitis    PE (pulmonary thromboembolism) ([place])    Peripheral neuropathy    PUD (peptic ulcer disease)    Renal disorder    Stroke [facility])    a. ?details unclear - not seen on imaging when he was admitted in 05/2017 for TIA symptoms which were felt due to cervical radiculopathy.   Thoracic aortic ectasia (HCC)    a. 4.3cm ectatic ascending thoracic aorta by CT 06/2017.    Type 2 diabetes mellitus [facility])     Patient Active Problem List   Diagnosis Date Noted   Abscess of axillary fold 68/08/5725   Acute metabolic encephalopathy 20/35/5974   Blood per rectum 03/22/2022   Acute upper GI bleed 03/22/2022   Acute kidney injury ([place]) 01/12/2022   GI bleed 07/15/2021   Pulmonary embolism ([place]) 07/15/2021   Acute pulmonary embolism ([place]) 06/26/2021   Diabetic ulcer of lower leg ([place]) 06/26/2021   Acute respiratory failure with  hypoxia ([place]) 06/26/2021   COVID-19 virus infection 06/26/2021   Lumbar radiculitis 03/30/2021   Morbid obesity ([place]) 01/15/2021   Personal history of colonic polyps 01/15/2021   Rectal bleeding 01/15/2021   Pain due to onychomycosis of toenails of both feet 01/15/2021   Epistaxis, recurrent 10/21/2020   Laceration of nose 10/21/2020   Syncope 10/15/2020   OSA (obstructive sleep apnea)    Bilateral carotid artery stenosis 07/28/2020   Bilateral lower extremity edema 07/28/2020   CHF (congestive heart failure) ([place]) 07/28/2020   DDD (degenerative disc disease), lumbar 07/28/2020   Diabetic peripheral neuropathy ([place]) 07/28/2020   Fatty liver 07/28/2020   Irritable bowel syndrome with diarrhea 07/28/2020   Osteoarthritis of right hip 07/28/2020   Upper GI bleed 04/28/2020   Hypertensive urgency 04/27/2020   Acute GI bleeding 04/27/2020   Mixed diabetic hyperlipidemia associated with type 2 diabetes mellitus ([place]) 04/27/2020   Rotator cuff arthropathy 10/30/2019   Cervical radiculopathy 09/24/2019   Trochanteric bursitis of right hip 09/24/2019   Chronic diastolic CHF (congestive heart failure) ([place]) 08/23/2019   ARF (acute renal failure) ([place]) 08/01/2019   Hyperkalemia 08/01/2019   Uncontrolled type 2 diabetes mellitus with hyperglycemia ([place]) 08/01/2019   AKI (  acute kidney injury) ([place])    Hypotension 07/31/2019   DKA (diabetic ketoacidosis) ([place]) 05/05/2019   Blurred vision 05/05/2019   Trochanteric bursitis 02/11/2019   Long-term insulin use ([place]) 01/25/2019   Alteration consciousness 11/19/2018   Neck pain 10/09/2018   Left arm weakness 10/09/2018   B12 deficiency 08/10/2017   Persistent headaches 08/09/2017   GERD (gastroesophageal reflux disease) 06/29/2017   Left arm numbness    History of CVA (cerebrovascular accident) 06/17/2017   TIA (transient ischemic attack) 06/17/2017   Chronic back pain 12/29/2016   Depression 12/15/2016   Vitamin D deficiency 12/09/2016    Right hip pain 12/07/2016   Hypokalemia 12/07/2016   Type 2 diabetes mellitus with vascular disease ([place]) 05/31/2016   Normocytic normochromic anemia 05/31/2016   Chest pain 05/31/2016   Coronary artery disease involving native coronary artery of native heart without angina pectoris 01/06/2016   Lactic acidosis 05/28/2014   Nonspecific chest pain 01/29/2014   DM2 (diabetes mellitus, type 2) ([place]) 01/29/2014   Obesity, Class III, BMI 40-49.9 (morbid obesity) ([place]) 01/29/2014   Snoring 01/29/2014   Dyslipidemia 01/29/2014   HTN (hypertension) 01/29/2014   Abnormal nuclear stress test 01/29/2014    Past Surgical History:  Procedure Laterality Date   CARDIAC CATHETERIZATION N/A 05/31/2016   Procedure: Left Heart Cath and Coronary Angiography;  Surgeon: Peter M Martinique, MD;  Location: [place] [facility];  Service: Cardiovascular;  Laterality: N/A;   CARDIAC CATHETERIZATION N/A 05/31/2016   Procedure: Intravascular Pressure Wire/FFR Study;  Surgeon: Peter M Martinique, MD;  Location: [place] CV [facility];  Service: Cardiovascular;  Laterality: N/A;   CARDIAC CATHETERIZATION N/A 05/31/2016   Procedure: Coronary Stent Intervention;  Surgeon: Peter M Martinique, MD;  Location: [place] CV LAB;  Service: Cardiovascular;  Laterality: N/A;   COLONOSCOPY N/A 07/17/2021   Procedure: COLONOSCOPY;  Surgeon: Wilford Corner, MD;  Location: [facility] ENDOSCOPY;  Service: Endoscopy;  Laterality: N/A;   COLONOSCOPY WITH PROPOFOL N/A 04/29/2020   Procedure: COLONOSCOPY WITH PROPOFOL;  Surgeon: Wilford Corner, MD;  Location: [place];  Service: Endoscopy;  Laterality: N/A;   ESOPHAGOGASTRODUODENOSCOPY N/A 04/29/2020   Procedure: ESOPHAGOGASTRODUODENOSCOPY (EGD);  Surgeon: Wilford Corner, MD;  Location: [place];  Service: Endoscopy;  Laterality: N/A;   ESOPHAGOGASTRODUODENOSCOPY (EGD) WITH PROPOFOL N/A 07/17/2021   Procedure: ESOPHAGOGASTRODUODENOSCOPY (EGD) WITH PROPOFOL;  Surgeon: Wilford Corner, MD;  Location:  [facility] ENDOSCOPY;  Service: Endoscopy;  Laterality: N/A;   LEFT HEART CATH AND CORONARY ANGIOGRAPHY N/A 12/08/2017   Procedure: LEFT HEART CATH AND CORONARY ANGIOGRAPHY;  Surgeon: Leonie Man, MD;  Location: [place] [facility];  Service: Cardiovascular;  Laterality: N/A;   LEFT HEART CATHETERIZATION WITH CORONARY ANGIOGRAM N/A 02/03/2014   Procedure: LEFT HEART CATHETERIZATION WITH CORONARY ANGIOGRAM;  Surgeon: Pixie Casino, MD;  Location: [facility] C[facility] LAB;  Service: Cardiovascular;  Laterality: N/A;   left leg stent      POLYPECTOMY  04/29/2020   Procedure: POLYPECTOMY;  Surgeon: Wilford Corner, MD;  Location: [facility] ENDOSCOPY;  Service: Endoscopy;;   POLYPECTOMY  07/17/2021   Procedure: POLYPECTOMY;  Surgeon: Wilford Corner, MD;  Location: [facility] ENDOSCOPY;  Service: Endoscopy;;       Home Medications    Prior to Admission medications   Medication Sig Start Date End Date Taking? Authorizing Provider  ACCU-CHEK GUIDE test strip  05/19/22  Yes [provider]  albuterol (VENTOLIN HFA) 108 (90 Base) MCG/ACT inhaler Inhale 2 puffs into the lungs every 6 (six) hours as needed for wheezing or  shortness of breath.   Yes [provider]  amoxicillin-clavulanate (AUGMENTIN) 875-125 MG tablet Take 1 tablet by mouth every 12 (twelve) hours for 7 days. 06/16/22 06/23/22 Yes Ronny Bacon, MD  apixaban (ELIQUIS) 5 MG TABS tablet Take 1 tablet (5 mg total) by mouth 2 (two) times daily. 03/30/22  Yes Hosie Poisson, MD  atorvastatin (LIPITOR) 40 MG tablet Take 40 mg by mouth at bedtime.   Yes [provider]  carvedilol (COREG) 6.25 MG tablet Take 1 tablet (6.25 mg total) by mouth 2 (two) times daily with a meal. 03/26/22  Yes Hosie Poisson, MD  Continuous Blood Gluc Sensor (FREESTYLE LIBRE 2 SENSOR) MISC  05/02/22  Yes [provider]  cyanocobalamin 1000 MCG tablet Take 1,000 mcg by mouth daily. 03/06/20  Yes [provider]  diclofenac (FLECTOR) 1.3 % PTCH Place 1  patch onto the skin 2 (two) times daily as needed (back and knee pain).   Yes [provider]  fenofibrate (TRICOR) 145 MG tablet TAKE 1 TABLET (145 MG TOTAL) BY MOUTH DAILY. 05/19/22  Yes Cleaver, Jossie Ng, NP  Fluticasone Propionate (FLONASE ALLERGY RELIEF NA) Place 1 spray into the nose daily as needed (congestion).   Yes [provider]  furosemide (LASIX) 40 MG tablet Take 40 mg by mouth 2 (two) times daily.   Yes [provider]  gabapentin (NEURONTIN) 800 MG tablet Take 800 mg by mouth 3 (three) times daily. 09/29/21  Yes [provider]  glipiZIDE (GLUCOTROL) 10 MG tablet Take 10 mg by mouth 2 (two) times daily. 05/30/17  Yes [provider]  HUMULIN R U-500 KWIKPEN 500 UNIT/ML kwikpen Inject 85-110 Units into the skin See admin instructions. Per sliding scale 3 times daily with meals 06/04/20  Yes [provider]  Insulin Pen Needle 31G X 5 MM MISC Use 1 needle daily to inject insulin as prescribed 06/18/17  Yes Barton Dubois, MD  isosorbide mononitrate (IMDUR) 30 MG 24 hr tablet TAKE 1 TABLET BY MOUTH DAILY Patient taking differently: Take 30 mg by mouth daily. 01/03/22  Yes Hilty, Nadean Corwin, MD  lisinopril (ZESTRIL) 20 MG tablet Take 20 mg by mouth daily.   Yes [provider]  Menthol, Topical Analgesic, (BIOFREEZE EX) Apply 1 application topically 2 (two) times daily as needed (knee pain).   Yes [provider]  nitroGLYCERIN (NITROSTAT) 0.4 MG SL tablet Place 1 tablet (0.4 mg total) under the tongue every 5 (five) minutes as needed for chest pain. 02/06/19  Yes Skeet Latch, MD  ondansetron (ZOFRAN) 4 MG tablet Take 4 mg by mouth 2 (two) times daily as needed for nausea/vomiting, vomiting or nausea. 04/21/20  Yes [provider]  Oxycodone HCl 20 MG TABS Take 20 mg by mouth 4 (four) times daily as needed (pain).   Yes [provider]  pantoprazole (PROTONIX) 40 MG tablet Take 1 tablet by mouth daily.  05/26/22  Yes [provider]  potassium chloride (MICRO-K) 10 MEQ CR capsule Take 20 mEq by mouth 2 (two) times daily. 04/03/20  Yes [provider]  sertraline (ZOLOFT) 50 MG tablet Take 50 mg by mouth every morning. 12/17/20  Yes [provider]  tiZANidine (ZANAFLEX) 4 MG tablet Take 4 mg by mouth at bedtime as needed for muscle spasms. 03/31/20  Yes [provider]  traZODone (DESYREL) 50 MG tablet Take 1 tablet (50 mg total) by mouth at bedtime. 12/15/16  Yes Burns, Arloa Koh, MD  Vitamin D, Ergocalciferol, (DRISDOL) 1.25  MG (50000 UT) CAPS capsule Take 50,000 Units by mouth every Monday. 08/15/19  Yes [provider]    Family History Family History  Problem Relation Age of Onset   Cancer Father    Hypertension Mother    Diabetes Mother    Breast cancer Mother    Hypertension Brother    Diabetes Brother    Hypertension Sister    Diabetes Sister     Social History Social History   Tobacco Use   Smoking status: Never    Passive exposure: Past   Smokeless tobacco: Never  Vaping Use   Vaping Use: Never used  Substance Use Topics   Alcohol use: No   Drug use: No     Allergies   Coconut flavor [flavoring agent], Ibuprofen, Aleve [naproxen], Nsaids, Other, and Vascepa [icosapent ethyl]   Review of Systems Review of Systems  Gastrointestinal: Negative.   Musculoskeletal: Negative.   Skin:  Positive for wound. Negative for color change and pallor.     Physical Exam Triage Vital Signs ED Triage Vitals  Enc Vitals Group     BP 06/20/22 1430 (!) 158/89     Pulse Rate 06/20/22 1430 62     Resp 06/20/22 1430 18     Temp 06/20/22 1430 98.3 F (36.8 C)     Temp Source 06/20/22 1430 Oral     SpO2 06/20/22 1430 94 %     Weight --      Height --      Head Circumference --      Peak Flow --      Pain Score 06/20/22 1429 0     Pain Loc --      Pain Edu? --      Excl. in [place]? --    No data found.  Updated Vital Signs BP (!)  158/89 (BP Location: Left Arm)   Pulse 62   Temp 98.3 F (36.8 C) (Oral)   Resp 18   SpO2 94%   Visual Acuity Right Eye Distance:   Left Eye Distance:   Bilateral Distance:    Right Eye Near:   Left Eye Near:    Bilateral Near:     Physical Exam Vitals and nursing note reviewed.  Constitutional:      General: He is not in acute distress.    Appearance: He is not ill-appearing.  Skin:    Comments: Incisional wound in the right axilla.  Induration measures about 2 to 3 inches in the longest diameter.  Wound packing is in place.  No bleeding or discharge noted.  Mild tenderness on palpation.  Neurological:     Mental Status: He is alert.      UC Treatments / Results  Labs (all labs ordered are listed, but only abnormal results are displayed) Labs Reviewed - No data to display  EKG   Radiology No results found.  Procedures Procedures (including critical care time)  Medications Ordered in UC Medications - No data to display  Initial Impression / Assessment and Plan / UC Course  I have reviewed the triage vital signs and the nursing notes.  Pertinent labs & imaging results that were available during my care of the patient were reviewed by me and considered in my medical decision making (see chart for details).     1.  Drained abscess: Wound packing removed Wound care instructions given Patient is advised to continue antibiotics Tylenol as needed for pain Okay to wash with soap and water Patient  is advised to follow-up with the general surgeon as scheduled Final Clinical Impressions(s) / UC Diagnoses   Final diagnoses:  Wound check, abscess     Discharge Instructions      Continue daily wound dressing changes Okay to take a shower.  Please wash soap off completely should soap come into contact with the wound.  Use mild soap when taking a shower. There is no discharge or bleeding, wound packing is not indicated at this time Continue taking antibiotics  as prescribed Follow-up with general surgery as scheduled Return to urgent care if you have any other concerns.   ED Prescriptions   None    PDMP not reviewed this encounter.   Chase Picket, MD 06/21/22 1448    Chase Picket, MD 06/21/22 [id number]

## 2022-06-23 ENCOUNTER — Encounter: Payer: Self-pay | Admitting: Surgery

## 2022-06-23 ENCOUNTER — Ambulatory Visit: Payer: Medicaid Other | Admitting: Surgery

## 2022-06-23 VITALS — BP 171/81 | HR 81 | Temp 98.6°F | Ht 70.0 in | Wt 350.0 lb

## 2022-06-23 DIAGNOSIS — L02419 Cutaneous abscess of limb, unspecified: Secondary | ICD-10-CM

## 2022-06-23 DIAGNOSIS — S41101A Unspecified open wound of right upper arm, initial encounter: Secondary | ICD-10-CM | POA: Insufficient documentation

## 2022-06-23 DIAGNOSIS — S41101D Unspecified open wound of right upper arm, subsequent encounter: Secondary | ICD-10-CM

## 2022-06-23 NOTE — Progress Notes (Signed)
Surgical Clinic Progress/Follow-up Note   HPI:  61 y.o. Male presents to clinic for right area axillary abscess follow-up, interval visit with urgent care for assistance with packing replacement resulted in no additional packing being replaced. Patient reports significant improvement/resolution of swelling, tenderness and overall appearance of the right axillary skin, and has been tolerating diet, denies N/V, fever/chills, CP, or SOB.  Review of Systems:  Constitutional: denies fever/chills  ENT: denies sore throat, hearing problems  Respiratory: denies shortness of breath, wheezing  Cardiovascular: denies chest pain, palpitations  Gastrointestinal: denies abdominal pain, N/V, or diarrhea/and bowel function as per interval history Skin: Denies any other rashes or skin discolorations as per interval history  Vital Signs:  BP (!) 171/81   Pulse 81   Temp 98.6 F (37 C)   Ht '5\' 10"'$  (1.778 m)   Wt (!) 350 lb (158.8 kg)   SpO2 97%   BMI 50.22 kg/m    Physical Exam:  Constitutional:  -- Obese body habitus  --Awake, alert, and oriented x3  Pulmonary:  -- Breathing non-labored at rest Cardiovascular:  -- S1, S2 present  -- No pericardial rubs  Gastrointestinal:  -- Soft and non-distended, well rounded, and non-tender  Musculoskeletal / Integumentary:  -- Wounds or skin discoloration: Skin appears perfectly normal in color today.  Utilizing sterile Q-tips I was able to probe the cavity and and utilizing bone curette retrieve multiple pieces of sebum/and sebaceous cyst content/lining.  This was done to confirm that there is no additional tunneling no additional loculations or cavities present, was not expecting additional cystic material.   Excisional debridement with bone curette was completed of the skin and subcutaneous tissues, and cystic cavity.  Once this was completed, we irrigated the cavity with normal saline, of note the cavity and wound extends anterior from the opening.  I then  placed a half-inch wide packing strip within the cavity utilizing plain packing.  I do not appreciate any other site needing packing or incision or drainage.  The axillary soft tissues are significantly softer now.  The inflammatory changes/infectious changes are without doubt resolving/improving.   -- Extremities: B/L UE and LE FROM, hands and feet warm,   Laboratory studies: No evidence of any cultures pending.  Imaging: No new pertinent imaging available for review   Assessment:  61 y.o. yo Male with a problem list including... Additional cystic material removed today, encouraging that primary source of original infection is getting managed. Patient Active Problem List   Diagnosis Date Noted   Abscess of axillary fold 59/91/6384   Acute metabolic encephalopathy 59/59/9357   Blood per rectum 03/22/2022   Acute upper GI bleed 03/22/2022   Acute kidney injury ([place]) 59/22/2023   GI bleed 59/22/2022   Pulmonary embolism ([place]) 07/15/2021   Acute pulmonary embolism ([place]) 06/26/2021   Diabetic ulcer of lower leg ([place]) 06/26/2021   Acute respiratory failure with hypoxia ([place]) 06/26/2021   COVID-19 virus infection 06/26/2021   Lumbar radiculitis 03/30/2021   Morbid obesity ([place]) 01/15/2021   Personal history of colonic polyps 01/15/2021   Rectal bleeding 01/15/2021   Pain due to onychomycosis of toenails of both feet 01/15/2021   Epistaxis, recurrent 10/21/2020   Laceration of nose 10/21/2020   Syncope 10/15/2020   OSA (obstructive sleep apnea)    Bilateral carotid artery stenosis 07/28/2020   Bilateral lower extremity edema 07/28/2020   CHF (congestive heart failure) ([place]) 07/28/2020   DDD (degenerative disc disease), lumbar 07/28/2020   Diabetic peripheral neuropathy ([place]) 07/28/2020  Fatty liver 07/28/2020   Irritable bowel syndrome with diarrhea 07/28/2020   Osteoarthritis of right hip 07/28/2020   Upper GI bleed 04/28/2020   Hypertensive urgency 04/27/2020   Acute GI bleeding  04/27/2020   Mixed diabetic hyperlipidemia associated with type 2 diabetes mellitus ([place]) 04/27/2020   Rotator cuff arthropathy 10/30/2019   Cervical radiculopathy 09/24/2019   Trochanteric bursitis of right hip 09/24/2019   Chronic diastolic CHF (congestive heart failure) ([place]) 08/23/2019   ARF (acute renal failure) ([place]) 08/01/2019   Hyperkalemia 08/01/2019   Uncontrolled type 2 diabetes mellitus with hyperglycemia ([place]) 08/01/2019   AKI (acute kidney injury) ([place])    Hypotension 07/31/2019   DKA (diabetic ketoacidosis) ([place]) 05/05/2019   Blurred vision 05/05/2019   Trochanteric bursitis 02/11/2019   Long-term insulin use ([place]) 01/25/2019   Alteration consciousness 11/19/2018   Neck pain 10/09/2018   Left arm weakness 10/09/2018   B12 deficiency 08/10/2017   Persistent headaches 08/09/2017   GERD (gastroesophageal reflux disease) 06/29/2017   Left arm numbness    History of CVA (cerebrovascular accident) 06/17/2017   TIA (transient ischemic attack) 06/17/2017   Chronic back pain 12/29/2016   Depression 12/15/2016   Vitamin D deficiency 12/09/2016   Right hip pain 12/07/2016   Hypokalemia 12/07/2016   Type 2 diabetes mellitus with vascular disease ([place]) 05/31/2016   Normocytic normochromic anemia 05/31/2016   Chest pain 05/31/2016   Coronary artery disease involving native coronary artery of native heart without angina pectoris 01/06/2016   Lactic acidosis 05/28/2014   Nonspecific chest pain 01/29/2014   DM2 (diabetes mellitus, type 2) ([place]) 01/29/2014   Obesity, Class III, BMI 40-49.9 (morbid obesity) ([place]) 01/29/2014   Snoring 01/29/2014   Dyslipidemia 01/29/2014   HTN (hypertension) 01/29/2014   Abnormal nuclear stress test 01/29/2014    presents to clinic for follow-up evaluation of right axillary abscess wound, progressing well.  Plan:              - return to clinic next week,  for dressing change, anticipating gradual withdrawal of the current packing, with  changes of the cover.  Not to bother with urgent care visit for any wound care assistance over the weekend.  Instructed to call office if any questions or concerns  All of the above recommendations were discussed with the patient and all of patient's questions were answered to his expressed satisfaction.  We will finish this course of Augmentin for now.  These notes generated with voice recognition software. I apologize for typographical errors.  Ronny Bacon, MD, FACS [place]: [place] for exceptional care. Office: [phone]

## 2022-06-23 NOTE — Patient Instructions (Signed)
Change the top dressing daily and as needed. Be sure to once a day pull a very small amount of packing out.   Follow up here on Tuesday for a packing change.

## 2022-06-28 ENCOUNTER — Ambulatory Visit: Payer: Medicaid Other | Admitting: Physician Assistant

## 2022-06-28 ENCOUNTER — Encounter: Payer: Self-pay | Admitting: Physician Assistant

## 2022-06-28 ENCOUNTER — Other Ambulatory Visit: Payer: Self-pay

## 2022-06-28 VITALS — BP 190/87 | HR 71 | Temp 98.7°F | Ht 70.0 in | Wt 351.4 lb

## 2022-06-28 DIAGNOSIS — S41101D Unspecified open wound of right upper arm, subsequent encounter: Secondary | ICD-10-CM

## 2022-06-28 DIAGNOSIS — L02411 Cutaneous abscess of right axilla: Secondary | ICD-10-CM

## 2022-06-28 DIAGNOSIS — L02419 Cutaneous abscess of limb, unspecified: Secondary | ICD-10-CM

## 2022-06-28 NOTE — Progress Notes (Signed)
[facility] SURGICAL CLINIC NOTE  06/28/2022  History of Present Illness: [name] is a 61 y.o. male who presents to clinic on 09/05 for follow up regarding right axillary abscess. Since his last visit, he reports that he has continued to do well. Minimal soreness in the area. He denied any drainage, no fever, no chills. He no longer is packing the wound. He is having issue with superficial dressing sticking to the surrounding skin. Otherwise doing well, no additional complaints.   Past Medical History: Past Medical History:  Diagnosis Date   Anemia    Arthritis    Back pain    CAD (coronary artery disease)    a. s/p DES to LAD in 05/2016   Cervical radiculopathy    Chronic diastolic CHF (congestive heart failure) (HCC)    Chronic pain    Depression    DVT (deep venous thrombosis) (HCC)    Hematemesis    Hepatic steatosis    Hyperlipidemia    Hypertension    IBS (irritable bowel syndrome)    Morbid obesity (HCC)    OSA (obstructive sleep apnea)    Pancreatitis    PE (pulmonary thromboembolism) ([place])    Peripheral neuropathy    PUD (peptic ulcer disease)    Renal disorder    Stroke [facility])    a. ?details unclear - not seen on imaging when he was admitted in 05/2017 for TIA symptoms which were felt due to cervical radiculopathy.   Thoracic aortic ectasia (HCC)    a. 4.3cm ectatic ascending thoracic aorta by CT 06/2017.    Type 2 diabetes mellitus ([place])      Past Surgical History: Past Surgical History:  Procedure Laterality Date   CARDIAC CATHETERIZATION N/A 05/31/2016   Procedure: Left Heart Cath and Coronary Angiography;  Surgeon: Peter M Martinique, MD;  Location: [place] [facility];  Service: Cardiovascular;  Laterality: N/A;   CARDIAC CATHETERIZATION N/A 05/31/2016   Procedure: Intravascular Pressure Wire/FFR Study;  Surgeon: Peter M Martinique, MD;  Location: [place] [facility];  Service: Cardiovascular;  Laterality: N/A;   CARDIAC CATHETERIZATION N/A  05/31/2016   Procedure: Coronary Stent Intervention;  Surgeon: Peter M Martinique, MD;  Location: [place] [facility];  Service: Cardiovascular;  Laterality: N/A;   COLONOSCOPY N/A 07/17/2021   Procedure: COLONOSCOPY;  Surgeon: Wilford Corner, MD;  Location: [facility] ENDOSCOPY;  Service: Endoscopy;  Laterality: N/A;   COLONOSCOPY WITH PROPOFOL N/A 04/29/2020   Procedure: COLONOSCOPY WITH PROPOFOL;  Surgeon: Wilford Corner, MD;  Location: [place];  Service: Endoscopy;  Laterality: N/A;   ESOPHAGOGASTRODUODENOSCOPY N/A 04/29/2020   Procedure: ESOPHAGOGASTRODUODENOSCOPY (EGD);  Surgeon: Wilford Corner, MD;  Location: [place];  Service: Endoscopy;  Laterality: N/A;   ESOPHAGOGASTRODUODENOSCOPY (EGD) WITH PROPOFOL N/A 07/17/2021   Procedure: ESOPHAGOGASTRODUODENOSCOPY (EGD) WITH PROPOFOL;  Surgeon: Wilford Corner, MD;  Location: [facility];  Service: Endoscopy;  Laterality: N/A;   LEFT HEART CATH AND CORONARY ANGIOGRAPHY N/A 12/08/2017   Procedure: LEFT HEART CATH AND CORONARY ANGIOGRAPHY;  Surgeon: Leonie Man, MD;  Location: [place] [facility];  Service: Cardiovascular;  Laterality: N/A;   LEFT HEART CATHETERIZATION WITH CORONARY ANGIOGRAM N/A 02/03/2014   Procedure: LEFT HEART CATHETERIZATION WITH CORONARY ANGIOGRAM;  Surgeon: Pixie Casino, MD;  Location: [facility] CATH LAB;  Service: Cardiovascular;  Laterality: N/A;   left leg stent      POLYPECTOMY  04/29/2020   Procedure: POLYPECTOMY;  Surgeon: Wilford Corner, MD;  Location: [facility] ENDOSCOPY;  Service: Endoscopy;;  POLYPECTOMY  07/17/2021   Procedure: POLYPECTOMY;  Surgeon: Wilford Corner, MD;  Location: [facility] ENDOSCOPY;  Service: Endoscopy;;    Home Medications: Prior to Admission medications   Medication Sig Start Date End Date Taking? Authorizing Provider  ACCU-CHEK GUIDE test strip  05/19/22  Yes [provider]  albuterol (VENTOLIN HFA) 108 (90 Base) MCG/ACT inhaler Inhale 2 puffs into the lungs every 6 (six) hours as needed  for wheezing or shortness of breath.   Yes [provider]  apixaban (ELIQUIS) 5 MG TABS tablet Take 1 tablet (5 mg total) by mouth 2 (two) times daily. 03/30/22  Yes Hosie Poisson, MD  atorvastatin (LIPITOR) 40 MG tablet Take 40 mg by mouth at bedtime.   Yes [provider]  carvedilol (COREG) 6.25 MG tablet Take 1 tablet (6.25 mg total) by mouth 2 (two) times daily with a meal. 03/26/22  Yes Hosie Poisson, MD  Continuous Blood Gluc Sensor (FREESTYLE LIBRE 2 SENSOR) MISC  05/02/22  Yes [provider]  cyanocobalamin 1000 MCG tablet Take 1,000 mcg by mouth daily. 03/06/20  Yes [provider]  diclofenac (FLECTOR) 1.3 % PTCH Place 1 patch onto the skin 2 (two) times daily as needed (back and knee pain).   Yes [provider]  fenofibrate (TRICOR) 145 MG tablet TAKE 1 TABLET (145 MG TOTAL) BY MOUTH DAILY. 05/19/22  Yes Cleaver, Jossie Ng, NP  Fluticasone Propionate (FLONASE ALLERGY RELIEF NA) Place 1 spray into the nose daily as needed (congestion).   Yes [provider]  furosemide (LASIX) 40 MG tablet Take 40 mg by mouth 2 (two) times daily.   Yes [provider]  gabapentin (NEURONTIN) 800 MG tablet Take 800 mg by mouth 3 (three) times daily. 09/29/21  Yes [provider]  glipiZIDE (GLUCOTROL) 10 MG tablet Take 10 mg by mouth 2 (two) times daily. 05/30/17  Yes [provider]  HUMULIN R U-500 KWIKPEN 500 UNIT/ML kwikpen Inject 85-110 Units into the skin See admin instructions. Per sliding scale 3 times daily with meals 06/04/20  Yes [provider]  Insulin Pen Needle 31G X 5 MM MISC Use 1 needle daily to inject insulin as prescribed 06/18/17  Yes Barton Dubois, MD  isosorbide mononitrate (IMDUR) 30 MG 24 hr tablet TAKE 1 TABLET BY MOUTH DAILY Patient taking differently: Take 30 mg by mouth daily. 01/03/22  Yes Hilty, Nadean Corwin, MD  lisinopril (ZESTRIL) 20 MG tablet Take 20 mg by mouth daily.   Yes [provider]  Menthol, Topical Analgesic, (BIOFREEZE EX) Apply 1 application topically 2 (two) times daily as needed (knee pain).   Yes [provider]  nitroGLYCERIN (NITROSTAT) 0.4 MG SL tablet Place 1 tablet (0.4 mg total) under the tongue every 5 (five) minutes as needed for chest pain. 02/06/19  Yes Skeet Latch, MD  ondansetron (ZOFRAN) 4 MG tablet Take 4 mg by mouth 2 (two) times daily as needed for nausea/vomiting, vomiting or nausea. 04/21/20  Yes [provider]  Oxycodone HCl 20 MG TABS Take 20 mg by mouth 4 (four) times daily as needed (pain).   Yes [provider]  pantoprazole (PROTONIX) 40 MG tablet Take 1 tablet by mouth daily. 05/26/22  Yes [provider]  potassium chloride (MICRO-K) 10 MEQ CR capsule Take 20 mEq by mouth 2 (two) times daily. 04/03/20  Yes [provider]  sertraline (ZOLOFT) 50 MG tablet Take 50 mg by mouth every morning. 12/17/20  Yes [provider]  tiZANidine (  ZANAFLEX) 4 MG tablet Take 4 mg by mouth at bedtime as needed for muscle spasms. 03/31/20  Yes [provider]  traZODone (DESYREL) 50 MG tablet Take 1 tablet (50 mg total) by mouth at bedtime. 12/15/16  Yes Burns, Arloa Koh, MD  Vitamin D, Ergocalciferol, (DRISDOL) 1.25 MG (50000 UT) CAPS capsule Take 50,000 Units by mouth every Monday. 08/15/19  Yes [provider]    Allergies: Allergies  Allergen Reactions   Coconut Flavor [Flavoring Agent] Hives   Ibuprofen Other (See Comments)    Made gastric ulcers worse   Aleve [Naproxen] Other (See Comments)    DUE TO KIDNEYS   Nsaids Other (See Comments)    Stomach ulcers    Other Hives    Nut Allergy   Vascepa [Icosapent Ethyl] Other (See Comments)    headaches, chest pain, similar to sx of a stroke, hypotension     Review of Systems: Review of Systems  Constitutional:  Negative for chills and fever.  Respiratory:  Negative for cough and shortness of breath.   Cardiovascular:  Negative  for chest pain and palpitations.  Gastrointestinal:  Negative for diarrhea, nausea and vomiting.  Skin:        + Right Axillary Wound (healing)  All other systems reviewed and are negative.   Physical Exam BP (!) 190/87   Pulse 71   Temp 98.7 F (37.1 C) (Oral)   Ht '5\' 10"'$  (1.778 m)   Wt (!) 351 lb 6.4 oz (159.4 kg)   SpO2 96%   BMI 50.42 kg/m   Physical Exam Vitals and nursing note reviewed. Exam conducted with a chaperone present.  Constitutional:      General: He is not in acute distress.    Appearance: Normal appearance. He is obese. He is not ill-appearing.     Comments: Patient sitting in chair; NAD  HENT:     Head: Normocephalic and atraumatic.  Eyes:     General: No scleral icterus.    Conjunctiva/sclera: Conjunctivae normal.  Pulmonary:     Effort: Pulmonary effort is normal. No respiratory distress.  Genitourinary:    Comments: Deferred Skin:    Findings: Wound present.     Comments: There is a very small 2 x <0.5 x <0.5 cm wound to the right axilla, there is no longer any depth, wound closing appropriately, visible wound bed appears to be healthy granulation tissue, the surrounding tissue is mildly indurated as expected, no erythema, no drainage   Neurological:     General: No focal deficit present.     Mental Status: He is alert and oriented to person, place, and time.  Psychiatric:        Mood and Affect: Mood normal.        Behavior: Behavior normal.     Labs/Imaging: No new pertinent imaging studies   Assessment and Plan: This is a 61 y.o. male, now nearly healed, right axillary abscess   - Patient's right axillary wound is almost completely closed. There is no longer any further depth to allow for packing. No evidence of infection nor recurrence currently. He can transition to smaller superficial dressings daily while this finishes closing. I believe it is reasonable to follow up on as needed basis at this point given his significant progress. He  understands that he can call at any time with questions/concerns.   Face-to-face time spent with the patient and care providers was 20 minutes, with more than 50% of the time spent counseling, educating, and  coordinating care of the patient.     Edison Simon, PA-C [facility] 06/28/2022, 3:29 PM M-F: 7am - 4pm

## 2022-06-28 NOTE — Patient Instructions (Signed)
Please call the office if you have any questions or concerns. 

## 2022-06-30 ENCOUNTER — Ambulatory Visit: Payer: Medicaid Other | Admitting: Surgery

## 2022-06-30 ENCOUNTER — Encounter: Payer: Self-pay | Admitting: Physician Assistant

## 2022-07-06 ENCOUNTER — Other Ambulatory Visit: Payer: Self-pay | Admitting: *Deleted

## 2022-07-06 DIAGNOSIS — I2699 Other pulmonary embolism without acute cor pulmonale: Secondary | ICD-10-CM

## 2022-07-06 MED ORDER — APIXABAN 5 MG PO TABS: 5.0000 mg | ORAL_TABLET | Freq: Two times a day (BID) | ORAL | 2 refills | Status: DC

## 2022-07-06 NOTE — Research (Signed)
[place] Screening run in retest 1 01-Mar-2021   ST: Sample assessed out of stability window, determined to be analytically valid by technologist, clinical validity to be determined by investigator

## 2022-07-06 NOTE — Research (Signed)
[place] Screening run in 15-February-2021   ST: Sample assessed out of stability window, determined to be analytically valid by technologist, clinical validity to be determined by investigator.

## 2022-07-06 NOTE — Research (Signed)
[place] Screening Qualification [phone]    ST: Sample assessed out of stability window, determined to be analytically valid by technologist, clinical validity to be determined by investigator.

## 2022-07-21 ENCOUNTER — Telehealth: Payer: Self-pay | Admitting: Cardiology

## 2022-07-21 MED ORDER — POTASSIUM CHLORIDE ER 10 MEQ PO CPCR: 20.0000 meq | ORAL_CAPSULE | Freq: Two times a day (BID) | ORAL | 3 refills | Status: DC

## 2022-07-21 NOTE — Telephone Encounter (Signed)
*  STAT* If patient is at the pharmacy, call can be transferred to refill team.   1. Which medications need to be refilled? (please list name of each medication and dose if known) potassium chloride (MICRO-K) 10 MEQ CR capsule  2. Which pharmacy/location (including street and city if local pharmacy) is medication to be sent to? [place], [place]  3. Do they need a 30 day or 90 day supply? [place]

## 2022-07-25 ENCOUNTER — Encounter: Payer: Self-pay | Admitting: Pulmonary Disease

## 2022-07-25 ENCOUNTER — Ambulatory Visit: Payer: Medicaid Other | Admitting: Pulmonary Disease

## 2022-07-25 VITALS — BP 140/56 | HR 71 | Temp 97.9°F | Ht 70.0 in | Wt 342.0 lb

## 2022-07-25 DIAGNOSIS — G4733 Obstructive sleep apnea (adult) (pediatric): Secondary | ICD-10-CM

## 2022-07-25 DIAGNOSIS — I2699 Other pulmonary embolism without acute cor pulmonale: Secondary | ICD-10-CM | POA: Diagnosis not present

## 2022-07-25 DIAGNOSIS — R0600 Dyspnea, unspecified: Secondary | ICD-10-CM

## 2022-07-25 NOTE — Progress Notes (Signed)
[name]    [id number]    [birth year]  Primary Care Physician:Robinson, Blanche [place], MD  Referring Physician: No referring provider defined for this encounter.  Chief complaint: Follow-up for dyspnea  HPI: 61 y.o.  with history of COVID-19, PE, OSA, morbid obesity, coronary artery disease, DVT/PE complains of chronic dyspnea on exertion for the past 5 months.  He was hospitalized in September 2022 with COVID-19 infection.  Also found to have acute pulmonary embolism.  Started on heparin drip and transition to Eliquis.  Treated with remdesivir and steroids.  He had an admission shortly thereafter for acute GI bleed in setting of anticoagulation.  Underwent EGD and colonoscopy with no significant bleeding.  He continues on Eliquis Also has history of coronary artery disease and is undergoing stress test and due for follow-up with cardiology  He has history of OSA with sleep study done at [facility].  He has not been on CPAP for many years but is noncompliant now  Pets: No pets Occupation: Retired Merchant [employer] officer Exposures: No mold, hot tub, Customer service manager.  No feather pillows or comforter Smoking history: Never smoker Travel history: No significant travel history Relevant family history: No family history of lung disease  Interim history: He was taken off anticoagulation earlier this year as there is concern for GI bleed but had to be hospitalized on 03/22/2022 with subsegmental PE.  He was placed back on IV heparin which was transitioned to oral anticoagulation.  Also noted to have bright red blood per rectum from internal hemorrhoids.  GI was consulted who recommended no further work up at this time as he had EGD and colonoscopy in 06/2021 showing diverticulosis and internal hemorrhoids.  Supportive care with PPI BID.   Echocardiogram during this admission showed LVEF 55-60% with normal RV systolic size and function, normal PA systolic pressure  He had planned to get  carpal tunnel surgery on the right hand but has now decided to just get conservative management.   Outpatient Encounter Medications as of 07/25/2022  Medication Sig   ACCU-CHEK GUIDE test strip    albuterol (VENTOLIN HFA) 108 (90 Base) MCG/ACT inhaler Inhale 2 puffs into the lungs every 6 (six) hours as needed for wheezing or shortness of breath.   apixaban (ELIQUIS) 5 MG TABS tablet Take 1 tablet (5 mg total) by mouth 2 (two) times daily.   atorvastatin (LIPITOR) 40 MG tablet Take 40 mg by mouth at bedtime.   carvedilol (COREG) 6.25 MG tablet Take 1 tablet (6.25 mg total) by mouth 2 (two) times daily with a meal.   Continuous Blood Gluc Sensor (FREESTYLE LIBRE 2 SENSOR) MISC    cyanocobalamin 1000 MCG tablet Take 1,000 mcg by mouth daily.   diclofenac (FLECTOR) 1.3 % PTCH Place 1 patch onto the skin 2 (two) times daily as needed (back and knee pain).   fenofibrate (TRICOR) 145 MG tablet TAKE 1 TABLET (145 MG TOTAL) BY MOUTH DAILY.   Fluticasone Propionate (FLONASE ALLERGY RELIEF NA) Place 1 spray into the nose daily as needed (congestion).   furosemide (LASIX) 40 MG tablet Take 40 mg by mouth 2 (two) times daily.   gabapentin (NEURONTIN) 800 MG tablet Take 800 mg by mouth 3 (three) times daily.   glipiZIDE (GLUCOTROL) 10 MG tablet Take 10 mg by mouth 2 (two) times daily.   HUMULIN R U-500 KWIKPEN 500 UNIT/ML kwikpen Inject 85-110 Units into the skin See admin instructions. Per sliding scale 3 times  daily with meals   Insulin Pen Needle 31G X 5 MM MISC Use 1 needle daily to inject insulin as prescribed   isosorbide mononitrate (IMDUR) 30 MG 24 hr tablet TAKE 1 TABLET BY MOUTH DAILY (Patient taking differently: Take 30 mg by mouth daily.)   lisinopril (ZESTRIL) 20 MG tablet Take 20 mg by mouth daily.   Menthol, Topical Analgesic, (BIOFREEZE EX) Apply 1 application topically 2 (two) times daily as needed (knee pain).   nitroGLYCERIN (NITROSTAT) 0.4 MG SL tablet Place 1 tablet (0.4 mg total) under  the tongue every 5 (five) minutes as needed for chest pain.   ondansetron (ZOFRAN) 4 MG tablet Take 4 mg by mouth 2 (two) times daily as needed for nausea/vomiting, vomiting or nausea.   Oxycodone HCl 20 MG TABS Take 20 mg by mouth 4 (four) times daily as needed (pain).   pantoprazole (PROTONIX) 40 MG tablet Take 1 tablet by mouth daily.   potassium chloride (MICRO-K) 10 MEQ CR capsule Take 2 capsules (20 mEq total) by mouth 2 (two) times daily.   sertraline (ZOLOFT) 50 MG tablet Take 50 mg by mouth every morning.   tiZANidine (ZANAFLEX) 4 MG tablet Take 4 mg by mouth at bedtime as needed for muscle spasms.   traZODone (DESYREL) 50 MG tablet Take 1 tablet (50 mg total) by mouth at bedtime.   Vitamin D, Ergocalciferol, (DRISDOL) 1.25 MG (50000 UT) CAPS capsule Take 50,000 Units by mouth every Monday.   No facility-administered encounter medications on file as of 07/25/2022.   Physical Exam: Blood pressure (!) 140/56, pulse 71, temperature 97.9 F (36.6 C), temperature source Oral, height '5\' 10"'$  (1.778 m), weight (!) 342 lb (155.1 kg), SpO2 96 %. Gen:      No acute distress HEENT:  EOMI, sclera anicteric Neck:     No masses; no thyromegaly Lungs:    Clear to auscultation bilaterally; normal respiratory effort CV:         Regular rate and rhythm; no murmurs Abd:      + bowel sounds; soft, non-tender; no palpable masses, no distension Ext:    No edema; adequate peripheral perfusion Skin:      Warm and dry; no rash Neuro: alert and oriented x 3 Psych: normal mood and affect   Data Reviewed: Imaging: CT 06/25/2021-pulm embolism with moderate clot burden, RV/LV ratio 1.08, patchy groundglass opacities in the right upper lobe, chronic dilatation of pulmonary artery  CTA 10/08/2021-significantly reduced clot burden which is essentially resolved with minimal residual clot.  VQ scan 03/23/2022-high probability for PE in the left lung  CTA 03/25/2022-acute subsegmental left upper lobe pulmonary  embolism I have reviewed the images personally.  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  No pulmonary hypertension on recent echocardiogram. He will need indefinite anticoagulation.  He does have history of bright red blood blood per rectum due to hemorrhoids but is not an ongoing issue.  Continue  to monitor  PFTs with mild restriction which is secondary to body habitus Suspect dyspnea is multifactorial from obesity, deconditioning, heart failure  Encouraged exercise and weight loss.  Sleep apnea Untreated.  Not currently on CPAP Follow-up sleep study reviewed with AHI 5.2.  We discussed resumption of CPAP therapy but he would like to avoid if possible.  He is currently on supplemental oxygen at night and will work on weight loss  Plan/Recommendations: Weight loss with exercise and diet. Continue indefinite anticoagulation.  Marshell Garfinkel MD [facility] 07/25/2022, 11:45 AM  CC: No ref. provider found

## 2022-07-25 NOTE — Patient Instructions (Signed)
Glad you are stable with your breathing Continue the blood thinner as prescribed Work on weight loss.  It is okay to exercise Follow-up in 1 year.

## 2022-07-29 ENCOUNTER — Other Ambulatory Visit: Payer: Self-pay

## 2022-07-29 ENCOUNTER — Inpatient Hospital Stay: Payer: Medicaid Other | Admitting: Oncology

## 2022-07-29 ENCOUNTER — Inpatient Hospital Stay: Payer: Medicaid Other | Attending: Oncology

## 2022-07-29 VITALS — BP 175/59 | HR 84 | Temp 97.9°F | Wt 340.7 lb

## 2022-07-29 DIAGNOSIS — Z8639 Personal history of other endocrine, nutritional and metabolic disease: Secondary | ICD-10-CM | POA: Insufficient documentation

## 2022-07-29 DIAGNOSIS — D649 Anemia, unspecified: Secondary | ICD-10-CM | POA: Insufficient documentation

## 2022-07-29 DIAGNOSIS — Z86711 Personal history of pulmonary embolism: Secondary | ICD-10-CM | POA: Insufficient documentation

## 2022-07-29 DIAGNOSIS — Z7901 Long term (current) use of anticoagulants: Secondary | ICD-10-CM | POA: Insufficient documentation

## 2022-07-29 DIAGNOSIS — Z79899 Other long term (current) drug therapy: Secondary | ICD-10-CM | POA: Insufficient documentation

## 2022-07-29 DIAGNOSIS — R0609 Other forms of dyspnea: Secondary | ICD-10-CM | POA: Insufficient documentation

## 2022-07-29 DIAGNOSIS — K922 Gastrointestinal hemorrhage, unspecified: Secondary | ICD-10-CM | POA: Insufficient documentation

## 2022-07-29 DIAGNOSIS — D509 Iron deficiency anemia, unspecified: Secondary | ICD-10-CM

## 2022-07-29 DIAGNOSIS — I2699 Other pulmonary embolism without acute cor pulmonale: Secondary | ICD-10-CM

## 2022-07-29 DIAGNOSIS — R0602 Shortness of breath: Secondary | ICD-10-CM | POA: Insufficient documentation

## 2022-07-29 DIAGNOSIS — Z8616 Personal history of COVID-19: Secondary | ICD-10-CM | POA: Diagnosis not present

## 2022-07-29 DIAGNOSIS — Z886 Allergy status to analgesic agent status: Secondary | ICD-10-CM | POA: Insufficient documentation

## 2022-07-29 LAB — CBC WITH DIFFERENTIAL (CANCER CENTER ONLY)
Abs Immature Granulocytes: 0.02 10*3/uL (ref 0.00–0.07)
Basophils Absolute: 0 10*3/uL (ref 0.0–0.1)
Basophils Relative: 0 %
Eosinophils Absolute: 0.1 10*3/uL (ref 0.0–0.5)
Eosinophils Relative: 2 %
HCT: 36.2 % — ABNORMAL LOW (ref 39.0–52.0)
Hemoglobin: 12 g/dL — ABNORMAL LOW (ref 13.0–17.0)
Immature Granulocytes: 0 %
Lymphocytes Relative: 23 %
Lymphs Abs: 1.6 10*3/uL (ref 0.7–4.0)
MCH: 29.6 pg (ref 26.0–34.0)
MCHC: 33.1 g/dL (ref 30.0–36.0)
MCV: 89.4 fL (ref 80.0–100.0)
Monocytes Absolute: 0.5 10*3/uL (ref 0.1–1.0)
Monocytes Relative: 7 %
Neutro Abs: 5 10*3/uL (ref 1.7–7.7)
Neutrophils Relative %: 68 %
Platelet Count: 191 10*3/uL (ref 150–400)
RBC: 4.05 MIL/uL — ABNORMAL LOW (ref 4.22–5.81)
RDW: 14.2 % (ref 11.5–15.5)
WBC Count: 7.3 10*3/uL (ref 4.0–10.5)
nRBC: 0 % (ref 0.0–0.2)

## 2022-07-29 LAB — IRON AND IRON BINDING CAPACITY (CC-WL,HP ONLY)
Iron: 58 ug/dL (ref 45–182)
Saturation Ratios: 14 % — ABNORMAL LOW (ref 17.9–39.5)
TIBC: 430 ug/dL (ref 250–450)
UIBC: 372 ug/dL (ref 117–376)

## 2022-07-29 LAB — FERRITIN: Ferritin: 23 ng/mL — ABNORMAL LOW (ref 24–336)

## 2022-07-29 NOTE — Progress Notes (Signed)
Hematology and Oncology Follow Up Visit  [name] [id number] 03/24/1961 61 y.o. 07/29/2022 9:55 AM Drue Flirt, MDHayes, Shawnee Knapp, FNP   Principle Diagnosis: 61 year old man with pulmonary embolism provoked by COVID-19 infection diagnosed in September 2022.       Current therapy: Full dose anticoagulation with Eliquis started in September 2022.  Therapy held temporarily in April 2023 and resumed in June due to recurrent pulmonary embolism.  Interim History: [name] returns today for a follow-up visit.  Since last visit, he was hospitalized in May of for shortness of breath and found to have pulmonary embolism based on CT angiogram.  Started on heparin and transition to Eliquis and was discharged on June 3.  Since that time, he denies any hematochezia, melena or hemoptysis.  He denies any other complications related to Eliquis.  He still have some dyspnea on exertion which has not changed dramatically at this time.     Medications: Updated on review. Current Outpatient Medications  Medication Sig Dispense Refill   ACCU-CHEK GUIDE test strip      albuterol (VENTOLIN HFA) 108 (90 Base) MCG/ACT inhaler Inhale 2 puffs into the lungs every 6 (six) hours as needed for wheezing or shortness of breath.     apixaban (ELIQUIS) 5 MG TABS tablet Take 1 tablet (5 mg total) by mouth 2 (two) times daily. 60 tablet 2   atorvastatin (LIPITOR) 40 MG tablet Take 40 mg by mouth at bedtime.     carvedilol (COREG) 6.25 MG tablet Take 1 tablet (6.25 mg total) by mouth 2 (two) times daily with a meal. 60 tablet 2   Continuous Blood Gluc Sensor (FREESTYLE LIBRE 2 SENSOR) MISC      cyanocobalamin 1000 MCG tablet Take 1,000 mcg by mouth daily.     diclofenac (FLECTOR) 1.3 % PTCH Place 1 patch onto the skin 2 (two) times daily as needed (back and knee pain).     fenofibrate (TRICOR) 145 MG tablet TAKE 1 TABLET (145 MG TOTAL) BY MOUTH DAILY. 90 tablet 3   Fluticasone Propionate (FLONASE ALLERGY  RELIEF NA) Place 1 spray into the nose daily as needed (congestion).     furosemide (LASIX) 40 MG tablet Take 40 mg by mouth 2 (two) times daily.     gabapentin (NEURONTIN) 800 MG tablet Take 800 mg by mouth 3 (three) times daily.     glipiZIDE (GLUCOTROL) 10 MG tablet Take 10 mg by mouth 2 (two) times daily.  4   HUMULIN R U-500 KWIKPEN 500 UNIT/ML kwikpen Inject 85-110 Units into the skin See admin instructions. Per sliding scale 3 times daily with meals     Insulin Pen Needle 31G X 5 MM MISC Use 1 needle daily to inject insulin as prescribed 100 each 2   isosorbide mononitrate (IMDUR) 30 MG 24 hr tablet TAKE 1 TABLET BY MOUTH DAILY (Patient taking differently: Take 30 mg by mouth daily.) 28 tablet 12   lisinopril (ZESTRIL) 20 MG tablet Take 20 mg by mouth daily.     Menthol, Topical Analgesic, (BIOFREEZE EX) Apply 1 application topically 2 (two) times daily as needed (knee pain).     nitroGLYCERIN (NITROSTAT) 0.4 MG SL tablet Place 1 tablet (0.4 mg total) under the tongue every 5 (five) minutes as needed for chest pain. 100 tablet 1   ondansetron (ZOFRAN) 4 MG tablet Take 4 mg by mouth 2 (two) times daily as needed for nausea/vomiting, vomiting or nausea.     Oxycodone HCl 20 MG TABS  Take 20 mg by mouth 4 (four) times daily as needed (pain).     pantoprazole (PROTONIX) 40 MG tablet Take 1 tablet by mouth daily.     potassium chloride (MICRO-K) 10 MEQ CR capsule Take 2 capsules (20 mEq total) by mouth 2 (two) times daily. 120 capsule 3   sertraline (ZOLOFT) 50 MG tablet Take 50 mg by mouth every morning.     tiZANidine (ZANAFLEX) 4 MG tablet Take 4 mg by mouth at bedtime as needed for muscle spasms.     traZODone (DESYREL) 50 MG tablet Take 1 tablet (50 mg total) by mouth at bedtime. 30 tablet 11   Vitamin D, Ergocalciferol, (DRISDOL) 1.25 MG (50000 UT) CAPS capsule Take 50,000 Units by mouth every Monday.     No current facility-administered medications for this visit.     Allergies:   Allergies  Allergen Reactions   Coconut Flavor [Flavoring Agent] Hives   Ibuprofen Other (See Comments)    Made gastric ulcers worse   Aleve [Naproxen] Other (See Comments)    DUE TO KIDNEYS   Nsaids Other (See Comments)    Stomach ulcers    Other Hives    Nut Allergy   Vascepa [Icosapent Ethyl] Other (See Comments)    headaches, chest pain, similar to sx of a stroke, hypotension       Physical Exam:  There were no vitals taken for this visit.   ECOG: 1   General appearance: Comfortable appearing without any discomfort Head: Normocephalic without any trauma Oropharynx: Mucous membranes are moist and pink without any thrush or ulcers. Eyes: Pupils are equal and round reactive to light. Lymph nodes: No cervical, supraclavicular, inguinal or axillary lymphadenopathy.   Heart:regular rate and rhythm.  S1 and S2 without leg edema. Lung: Clear without any rhonchi or wheezes.  No dullness to percussion. Abdomin: Soft, nontender, nondistended with good bowel sounds.  No hepatosplenomegaly. Musculoskeletal: No joint deformity or effusion.  Full range of motion noted. Neurological: No deficits noted on motor, sensory and deep tendon reflex exam. Skin: No petechial rash or dryness.  Appeared moist.       Lab Results: Lab Results  Component Value Date   WBC 7.6 05/01/2022   HGB 10.1 (L) 05/01/2022   HCT 31.2 (L) 05/01/2022   MCV 95.7 05/01/2022   PLT 233 05/01/2022     Chemistry      Component Value Date/Time   NA 143 05/01/2022 1345   NA 145 (H) 12/27/2021 0950   K 3.7 05/01/2022 1345   CL 108 05/01/2022 1345   CO2 27 05/01/2022 1345   BUN 10 05/01/2022 1345   BUN 14 12/27/2021 0950   CREATININE 0.71 05/01/2022 1345   CREATININE 0.94 01/28/2022 0844   CREATININE 0.74 03/08/2019 1008      Component Value Date/Time   CALCIUM 9.1 05/01/2022 1345   ALKPHOS 29 (L) 03/23/2022 0558   AST 19 03/23/2022 0558   AST 22 01/28/2022 0844   ALT 17 03/23/2022 0558   ALT  19 01/28/2022 0844   BILITOT 0.5 03/23/2022 0558   BILITOT 0.5 01/28/2022 0844        Impression and Plan:  61 year old with:  1.  Recurrent venous thromboembolism initially diagnosed in 2018.  He developed PE in September 2022 provoked by COVID-19 infection.    Risks and benefits of continuing long-term anticoagulation with Eliquis were reviewed.  He is agreeable to continue at this time.  Given his recurrent thromboembolism and clearly did not tolerate  being off anticoagulation.  The nature of his thrombophilia is unclear but further characterization will not change the recommendation of lifetime anticoagulation.     2.  GI bleeding: Resolved at this time without any signs symptoms of bleeding.  I see no contraindication to long-term anticoagulation.   3.  Anemia: Resolved with normalized hemoglobin.   4.  Follow-up: He will return to hematology clinic in the future as needed.   30 minutes were spent on this encounter.  The time was dedicated to reviewing laboratory data, disease status update and outlining future plan of care discussion.       Zola Button, MD 10/6/20239:55 AM

## 2022-08-09 ENCOUNTER — Ambulatory Visit: Payer: Medicaid Other | Admitting: Cardiology

## 2022-08-15 ENCOUNTER — Encounter (HOSPITAL_COMMUNITY): Payer: Self-pay

## 2022-08-15 ENCOUNTER — Emergency Department (HOSPITAL_COMMUNITY): Payer: Medicaid Other

## 2022-08-15 ENCOUNTER — Observation Stay (HOSPITAL_COMMUNITY)
Admission: EM | Admit: 2022-08-15 | Discharge: 2022-08-17 | Disposition: A | Discharge status: Home / Self Care | Payer: Medicaid Other | Attending: Internal Medicine | Admitting: Internal Medicine

## 2022-08-15 DIAGNOSIS — K219 Gastro-esophageal reflux disease without esophagitis: Secondary | ICD-10-CM | POA: Diagnosis present

## 2022-08-15 DIAGNOSIS — E1151 Type 2 diabetes mellitus with diabetic peripheral angiopathy without gangrene: Secondary | ICD-10-CM | POA: Diagnosis not present

## 2022-08-15 DIAGNOSIS — Z8673 Personal history of transient ischemic attack (TIA), and cerebral infarction without residual deficits: Secondary | ICD-10-CM | POA: Diagnosis not present

## 2022-08-15 DIAGNOSIS — R0602 Shortness of breath: Secondary | ICD-10-CM | POA: Diagnosis present

## 2022-08-15 DIAGNOSIS — Z7984 Long term (current) use of oral hypoglycemic drugs: Secondary | ICD-10-CM | POA: Diagnosis not present

## 2022-08-15 DIAGNOSIS — R079 Chest pain, unspecified: Secondary | ICD-10-CM | POA: Diagnosis present

## 2022-08-15 DIAGNOSIS — Z794 Long term (current) use of insulin: Secondary | ICD-10-CM | POA: Insufficient documentation

## 2022-08-15 DIAGNOSIS — E785 Hyperlipidemia, unspecified: Secondary | ICD-10-CM | POA: Diagnosis present

## 2022-08-15 DIAGNOSIS — G4733 Obstructive sleep apnea (adult) (pediatric): Secondary | ICD-10-CM | POA: Diagnosis present

## 2022-08-15 DIAGNOSIS — Z7722 Contact with and (suspected) exposure to environmental tobacco smoke (acute) (chronic): Secondary | ICD-10-CM | POA: Insufficient documentation

## 2022-08-15 DIAGNOSIS — M159 Polyosteoarthritis, unspecified: Secondary | ICD-10-CM | POA: Diagnosis not present

## 2022-08-15 DIAGNOSIS — I82409 Acute embolism and thrombosis of unspecified deep veins of unspecified lower extremity: Secondary | ICD-10-CM | POA: Diagnosis present

## 2022-08-15 DIAGNOSIS — F32A Depression, unspecified: Secondary | ICD-10-CM | POA: Diagnosis present

## 2022-08-15 DIAGNOSIS — I110 Hypertensive heart disease with heart failure: Secondary | ICD-10-CM | POA: Insufficient documentation

## 2022-08-15 DIAGNOSIS — I2510 Atherosclerotic heart disease of native coronary artery without angina pectoris: Secondary | ICD-10-CM | POA: Diagnosis not present

## 2022-08-15 DIAGNOSIS — Z79899 Other long term (current) drug therapy: Secondary | ICD-10-CM | POA: Diagnosis not present

## 2022-08-15 DIAGNOSIS — E66813 Obesity, class 3: Secondary | ICD-10-CM | POA: Diagnosis present

## 2022-08-15 DIAGNOSIS — G5603 Carpal tunnel syndrome, bilateral upper limbs: Secondary | ICD-10-CM | POA: Diagnosis present

## 2022-08-15 DIAGNOSIS — H532 Diplopia: Secondary | ICD-10-CM | POA: Diagnosis not present

## 2022-08-15 DIAGNOSIS — Z86718 Personal history of other venous thrombosis and embolism: Secondary | ICD-10-CM | POA: Diagnosis not present

## 2022-08-15 DIAGNOSIS — M5412 Radiculopathy, cervical region: Secondary | ICD-10-CM | POA: Diagnosis present

## 2022-08-15 DIAGNOSIS — I5032 Chronic diastolic (congestive) heart failure: Secondary | ICD-10-CM | POA: Diagnosis present

## 2022-08-15 DIAGNOSIS — Z7901 Long term (current) use of anticoagulants: Secondary | ICD-10-CM | POA: Diagnosis not present

## 2022-08-15 DIAGNOSIS — N179 Acute kidney failure, unspecified: Secondary | ICD-10-CM | POA: Diagnosis present

## 2022-08-15 DIAGNOSIS — E1142 Type 2 diabetes mellitus with diabetic polyneuropathy: Secondary | ICD-10-CM | POA: Diagnosis present

## 2022-08-15 DIAGNOSIS — H4901 Third [oculomotor] nerve palsy, right eye: Secondary | ICD-10-CM | POA: Diagnosis not present

## 2022-08-15 DIAGNOSIS — E1165 Type 2 diabetes mellitus with hyperglycemia: Secondary | ICD-10-CM | POA: Diagnosis present

## 2022-08-15 DIAGNOSIS — Z955 Presence of coronary angioplasty implant and graft: Secondary | ICD-10-CM | POA: Insufficient documentation

## 2022-08-15 DIAGNOSIS — Z86711 Personal history of pulmonary embolism: Secondary | ICD-10-CM | POA: Insufficient documentation

## 2022-08-15 DIAGNOSIS — R519 Headache, unspecified: Secondary | ICD-10-CM | POA: Insufficient documentation

## 2022-08-15 DIAGNOSIS — H538 Other visual disturbances: Secondary | ICD-10-CM | POA: Diagnosis present

## 2022-08-15 DIAGNOSIS — I10 Essential (primary) hypertension: Secondary | ICD-10-CM | POA: Diagnosis present

## 2022-08-15 LAB — BASIC METABOLIC PANEL
Anion gap: 11 (ref 5–15)
BUN: 48 mg/dL — ABNORMAL HIGH (ref 6–20)
CO2: 24 mmol/L (ref 22–32)
Calcium: 9.2 mg/dL (ref 8.9–10.3)
Chloride: 101 mmol/L (ref 98–111)
Creatinine, Ser: 2.29 mg/dL — ABNORMAL HIGH (ref 0.61–1.24)
GFR, Estimated: 32 mL/min — ABNORMAL LOW (ref >60)
Glucose, Bld: 239 mg/dL — ABNORMAL HIGH (ref 70–99)
Potassium: 4.7 mmol/L (ref 3.5–5.1)
Sodium: 136 mmol/L (ref 135–145)

## 2022-08-15 LAB — CBC
HCT: 38.6 % — ABNORMAL LOW (ref 39.0–52.0)
Hemoglobin: 12.3 g/dL — ABNORMAL LOW (ref 13.0–17.0)
MCH: 29.4 pg (ref 26.0–34.0)
MCHC: 31.9 g/dL (ref 30.0–36.0)
MCV: 92.1 fL (ref 80.0–100.0)
Platelets: 218 10*3/uL (ref 150–400)
RBC: 4.19 MIL/uL — ABNORMAL LOW (ref 4.22–5.81)
RDW: 15.1 % (ref 11.5–15.5)
WBC: 8.6 10*3/uL (ref 4.0–10.5)
nRBC: 0 % (ref 0.0–0.2)

## 2022-08-15 LAB — BRAIN NATRIURETIC PEPTIDE: B Natriuretic Peptide: 8.3 pg/mL (ref 0.0–100.0)

## 2022-08-15 LAB — TROPONIN I (HIGH SENSITIVITY): Troponin I (High Sensitivity): 6 ng/L (ref <18)

## 2022-08-15 MED ORDER — APIXABAN 5 MG PO TABS
5.0000 mg | ORAL_TABLET | Freq: Two times a day (BID) | ORAL | Status: DC
  Administered 2022-08-16 – 2022-08-17 (×4): 5 mg via ORAL
  Filled 2022-08-15 (×4): qty 1

## 2022-08-15 MED ORDER — IOHEXOL 350 MG/ML SOLN: 75.0000 mL | Freq: Once | INTRAVENOUS | Status: DC | PRN

## 2022-08-15 MED ORDER — ATORVASTATIN CALCIUM 40 MG PO TABS
40.0000 mg | ORAL_TABLET | Freq: Every day | ORAL | Status: DC
  Administered 2022-08-16 (×2): 40 mg via ORAL
  Filled 2022-08-15 (×2): qty 1

## 2022-08-15 MED ORDER — DIPHENHYDRAMINE HCL 50 MG/ML IJ SOLN
12.5000 mg | Freq: Once | INTRAMUSCULAR | Status: AC
  Administered 2022-08-15: 12.5 mg via INTRAVENOUS
  Filled 2022-08-15: qty 1

## 2022-08-15 MED ORDER — PROCHLORPERAZINE EDISYLATE 10 MG/2ML IJ SOLN
10.0000 mg | Freq: Once | INTRAMUSCULAR | Status: AC
  Administered 2022-08-16: 10 mg via INTRAVENOUS
  Filled 2022-08-15: qty 2

## 2022-08-15 MED ORDER — TRAZODONE HCL 50 MG PO TABS
50.0000 mg | ORAL_TABLET | Freq: Every day | ORAL | Status: DC
  Administered 2022-08-16 (×2): 50 mg via ORAL
  Filled 2022-08-15 (×2): qty 1

## 2022-08-15 MED ORDER — FENOFIBRATE 160 MG PO TABS
160.0000 mg | ORAL_TABLET | Freq: Every day | ORAL | Status: DC
  Administered 2022-08-16 – 2022-08-17 (×2): 160 mg via ORAL
  Filled 2022-08-15 (×2): qty 1

## 2022-08-15 MED ORDER — ISOSORBIDE MONONITRATE ER 30 MG PO TB24
30.0000 mg | ORAL_TABLET | Freq: Every day | ORAL | Status: DC
  Administered 2022-08-16 – 2022-08-17 (×2): 30 mg via ORAL
  Filled 2022-08-15 (×2): qty 1

## 2022-08-15 MED ORDER — GLIPIZIDE 10 MG PO TABS
10.0000 mg | ORAL_TABLET | Freq: Two times a day (BID) | ORAL | Status: DC
  Administered 2022-08-16 – 2022-08-17 (×4): 10 mg via ORAL
  Filled 2022-08-15 (×5): qty 1

## 2022-08-15 MED ORDER — SODIUM CHLORIDE 0.9 % IV BOLUS
1000.0000 mL | Freq: Once | INTRAVENOUS | Status: AC
  Administered 2022-08-15: 1000 mL via INTRAVENOUS

## 2022-08-15 MED ORDER — IOHEXOL 350 MG/ML SOLN
75.0000 mL | Freq: Once | INTRAVENOUS | Status: AC | PRN
  Administered 2022-08-15: 75 mL via INTRAVENOUS

## 2022-08-15 MED ORDER — MAGNESIUM SULFATE 2 GM/50ML IV SOLN
2.0000 g | Freq: Once | INTRAVENOUS | Status: AC
  Administered 2022-08-16: 2 g via INTRAVENOUS
  Filled 2022-08-15: qty 50

## 2022-08-15 MED ORDER — CARVEDILOL 6.25 MG PO TABS
6.2500 mg | ORAL_TABLET | Freq: Two times a day (BID) | ORAL | Status: DC
  Administered 2022-08-16 – 2022-08-17 (×3): 6.25 mg via ORAL
  Filled 2022-08-15 (×2): qty 2
  Filled 2022-08-15: qty 1

## 2022-08-15 MED ORDER — METOCLOPRAMIDE HCL 5 MG/ML IJ SOLN
10.0000 mg | Freq: Once | INTRAMUSCULAR | Status: AC
  Administered 2022-08-15: 10 mg via INTRAVENOUS
  Filled 2022-08-15: qty 2

## 2022-08-15 MED ORDER — SERTRALINE HCL 50 MG PO TABS
50.0000 mg | ORAL_TABLET | Freq: Every morning | ORAL | Status: DC
  Administered 2022-08-16 – 2022-08-17 (×2): 50 mg via ORAL
  Filled 2022-08-15 (×2): qty 1

## 2022-08-15 MED ORDER — LISINOPRIL 10 MG PO TABS
20.0000 mg | ORAL_TABLET | Freq: Every day | ORAL | Status: DC
  Administered 2022-08-16: 20 mg via ORAL
  Filled 2022-08-15: qty 2

## 2022-08-15 NOTE — ED Provider Triage Note (Signed)
Emergency Medicine Provider Triage Evaluation Note  [name] , a 61 y.o. male  was evaluated in triage.  Pt with multiple complaints.  States he felt congested a few weeks ago, using nasal spray, and had went unconscious.  Did not read the instruction label, apparently he is not supposed to use this medication with his cardiac history.  Recent PE in July, on Eliquis.  Has had a continuous headache since with right eye pain.  Has been closing his right eye so he can see better without having double vision.  Also with worsening chest pressure/pain over the last 3 to 4 days and accompanied by shortness of breath with exertion.  Pain does not radiate.  Described as dull and somewhat constant.  Has not been helped by anything.  Denies fever, lower extremity weakness, other episodes of LOC, vomiting, diarrhea, constipation, however does endorse some intermittent nausea.  Review of Systems  Positive:  Negative: See above  Physical Exam  BP (!) 142/78 (BP Location: Left Arm)   Pulse 83   Temp 98.7 F (37.1 C) (Oral)   Resp 20   Ht '5\' 10"'$  (1.778 m)   Wt (!) 147.4 kg   SpO2 92%   BMI 46.63 kg/m  Gen:   Awake, no distress   Resp:  Normal effort  MSK:   Moves extremities without difficulty  Other:  Upper and lower extremities ROM, coordination, strength, and sensation appears grossly intact.  CN VII through XII appear grossly intact.  Mildly abnormal EOMs.  Gait grossly intact.  Chest non-TTP.  PERRLA.  Subjective diplopia reported.  Medical Decision Making  Medically screening exam initiated at 1:00 PM.  Appropriate orders placed.  ABDULLAHI VALLONE was informed that the remainder of the evaluation will be completed by another provider, this initial triage assessment does not replace that evaluation, and the importance of remaining in the ED until their evaluation is complete.  Discussed patient with triage nurse, plan to bring back to next available room.   Prince Rome,  PA-C 58/85/02 1401

## 2022-08-15 NOTE — ED Notes (Signed)
Patient transported to MRI 

## 2022-08-15 NOTE — Plan of Care (Addendum)
Briefly< [name] is a 61 y.o. male with hx of obesity, Pe on Eliquis, OSA, CAD, DM2, HTN, HLD, prior strokes, hx of prior bells palsy who presents with 10 day hx of headache, diplopia on looking to right, dysequilibrium.  MRI Brain is negative for an acute stroke. However, presentation still localizes to posterior circulation and high clinical suspicion for MRI negative stroke in the brainstem given his presentation.  CTA with no LVO, no significant stenosis.  Recs: Plan: - [facility] - Frequent Neuro checks per stroke unit protocol - Recommend obtaining TTE - Recommend obtaining Lipid panel with LDL - Please start statin if LDL > 70 - Recommend HbA1c - Antithrombotic - continue home Eliquis. - Recommend DVT ppx - SBP goal - gradual normotension. - Recommend Telemetry monitoring for arrythmia - Recommend bedside swallow screen prior to PO intake. - Stroke education booklet - Recommend PT/OT/SLP consult - Recommend Urine Tox screen.   [place] Pager Number [phone]

## 2022-08-15 NOTE — ED Notes (Signed)
Unsuccessful IV attempt x2.  

## 2022-08-15 NOTE — ED Triage Notes (Signed)
Pt presents with c/o HA behind right eye x2 weeks, SHOB, and CPx 4 days. Dizziness and blurred vision x 5 days. Hx PE in July, hx MI and CVA. Takes eliquis. Endorses balance troubles x11 days. Endorses nausea.

## 2022-08-15 NOTE — ED Provider Notes (Signed)
[facility] DEPT Provider Note   CSN: [id number] Arrival date & time: 08/15/22  1222     History  Chief Complaint  Patient presents with   Shortness of Breath   Chest Pain   Headache    [name] is a 61 y.o. male history of PE on Eliquis, multiple strokes, hypertension, diabetes, hyperlipidemia, presenting to the emergency department with complaint of headache, blurred vision, intermittent chest pain.  Patient reports she had a headache approximately 10 days ago, cannot recall if it was thunderclap or gradual onset.  But it has been persistent for 10 days, never going away, located on the right side of his face behind his right eye and in his right temple region.  He does not typically get headaches and has no history of migraines.  He reports he had noted an associated blurred or double vision in his right eye, as well as a "drooping" of his right eyelid.  He feels like there are some paresthesias and fullness around the right upper quadrant of his face, and says it feels fairly similar to when he had Bell's palsy, but he does not have a lower facial droop this time.  He reports has a history of "sick strokes" and says that his residual deficit is that "my balance is really off."  He reports he had some intermittent chest pains for the past several days, which have come and gone, and is not currently having any.  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  He is on Eliquis and compliant with this.  He denies history of aneurysm, either personal or family history, or smoking history.  He denies fevers, chills, neck pain.  HPI     Home Medications Prior to Admission medications   Medication Sig Start Date End Date Taking? Authorizing Provider  ACCU-CHEK GUIDE test strip  05/19/22   [provider]  albuterol (VENTOLIN HFA) 108 (90 Base) MCG/ACT inhaler Inhale 2 puffs into the lungs every 6 (six) hours as needed for wheezing or shortness  of breath.    [provider]  apixaban (ELIQUIS) 5 MG TABS tablet Take 1 tablet (5 mg total) by mouth 2 (two) times daily. 07/06/22   Wyatt Portela, MD  atorvastatin (LIPITOR) 40 MG tablet Take 40 mg by mouth at bedtime.    [provider]  carvedilol (COREG) 6.25 MG tablet Take 1 tablet (6.25 mg total) by mouth 2 (two) times daily with a meal. 03/26/22   Hosie Poisson, MD  Continuous Blood Gluc Sensor (FREESTYLE LIBRE 2 SENSOR) MISC  05/02/22   [provider]  cyanocobalamin 1000 MCG tablet Take 1,000 mcg by mouth daily. 03/06/20   [provider]  diclofenac (FLECTOR) 1.3 % PTCH Place 1 patch onto the skin 2 (two) times daily as needed (back and knee pain).    [provider]  fenofibrate (TRICOR) 145 MG tablet TAKE 1 TABLET (145 MG TOTAL) BY MOUTH DAILY. 05/19/22   Deberah Pelton, NP  Fluticasone Propionate (FLONASE ALLERGY RELIEF NA) Place 1 spray into the nose daily as needed (congestion).    [provider]  furosemide (LASIX) 40 MG tablet Take 40 mg by mouth 2 (two) times daily.    [provider]  gabapentin (NEURONTIN) 800 MG tablet Take 800 mg by mouth 3 (three) times daily. 09/29/21   [provider]  glipiZIDE (GLUCOTROL) 10 MG tablet Take 10 mg by mouth 2 (two) times daily. 05/30/17  [provider]  HUMULIN R U-500 KWIKPEN 500 UNIT/ML kwikpen Inject 85-110 Units into the skin See admin instructions. Per sliding scale 3 times daily with meals 06/04/20   [provider]  Insulin Pen Needle 31G X 5 MM MISC Use 1 needle daily to inject insulin as prescribed 06/18/17   Barton Dubois, MD  isosorbide mononitrate (IMDUR) 30 MG 24 hr tablet TAKE 1 TABLET BY MOUTH DAILY Patient taking differently: Take 30 mg by mouth daily. 01/03/22   Hilty, Nadean Corwin, MD  lisinopril (ZESTRIL) 20 MG tablet Take 20 mg by mouth daily.    [provider]  Menthol, Topical Analgesic, (BIOFREEZE EX) Apply 1 application  topically 2 (two) times daily as needed (knee pain).    [provider]  nitroGLYCERIN (NITROSTAT) 0.4 MG SL tablet Place 1 tablet (0.4 mg total) under the tongue every 5 (five) minutes as needed for chest pain. 02/06/19   Skeet Latch, MD  ondansetron (ZOFRAN) 4 MG tablet Take 4 mg by mouth 2 (two) times daily as needed for nausea/vomiting, vomiting or nausea. 04/21/20   [provider]  Oxycodone HCl 20 MG TABS Take 20 mg by mouth 4 (four) times daily as needed (pain).    [provider]  pantoprazole (PROTONIX) 40 MG tablet Take 1 tablet by mouth daily. 05/26/22   [provider]  potassium chloride (MICRO-K) 10 MEQ CR capsule Take 2 capsules (20 mEq total) by mouth 2 (two) times daily. 07/21/22   Lelon Perla, MD  sertraline (ZOLOFT) 50 MG tablet Take 50 mg by mouth every morning. 12/17/20   [provider]  tiZANidine (ZANAFLEX) 4 MG tablet Take 4 mg by mouth at bedtime as needed for muscle spasms. 03/31/20   [provider]  traZODone (DESYREL) 50 MG tablet Take 1 tablet (50 mg total) by mouth at bedtime. 12/15/16   Florinda Marker, MD  Vitamin D, Ergocalciferol, (DRISDOL) 1.25 MG (50000 UT) CAPS capsule Take 50,000 Units by mouth every Monday. 08/15/19   [provider]      Allergies    Coconut flavor [flavoring agent], Ibuprofen, Aleve [naproxen], Nsaids, Other, and Vascepa [icosapent ethyl]    Review of Systems   Review of Systems  Physical Exam Updated Vital Signs BP (!) 159/63   Pulse 74   Temp 98.2 F (36.8 C)   Resp 17   Ht '5\' 10"'$  (1.778 m)   Wt (!) 147.4 kg   SpO2 98%   BMI 46.63 kg/m  Physical Exam Constitutional:      General: He is not in acute distress. HENT:     Head: Normocephalic and atraumatic.  Eyes:     Conjunctiva/sclera: Conjunctivae normal.     Pupils: Pupils are equal, round, and reactive to light.  Cardiovascular:     Rate and Rhythm: Normal rate and regular rhythm.  Pulmonary:      Effort: Pulmonary effort is normal. No respiratory distress.  Abdominal:     General: There is no distension.     Tenderness: There is no abdominal tenderness.  Skin:    General: Skin is warm and dry.  Neurological:     General: No focal deficit present.     Mental Status: He is alert. Mental status is at baseline.     Comments: Extraocular range of motion is intact, no evident extraocular nerve palsy.  Patient reports diplopia in the right eye.  Peripheral fields are intact in both eyes.  Vision is grossly normal.  Pupils are equally reactive.  Patient does have ptosis of the right eyelid.  No lower facial involvement. He reports paresthesias of the V2 and V1 dermatome of the right side of the face; no anesthesia. He demonstrates difficulty with finger-to-nose with bilateral hand testing. Muscle strength is normal. Speech is clear He has no nuchal rigidity  Psychiatric:        Mood and Affect: Mood normal.        Behavior: Behavior normal.     ED Results / Procedures / Treatments   Labs (all labs ordered are listed, but only abnormal results are displayed) Labs Reviewed  CBC - Abnormal; Notable for the following components:      Result Value   RBC 4.19 (*)    Hemoglobin 12.3 (*)    HCT 38.6 (*)    All other components within normal limits  BASIC METABOLIC PANEL - Abnormal; Notable for the following components:   Glucose, Bld 239 (*)    BUN 48 (*)    Creatinine, Ser 2.29 (*)    GFR, Estimated 32 (*)    All other components within normal limits  BRAIN NATRIURETIC PEPTIDE  TROPONIN I (HIGH SENSITIVITY)  TROPONIN I (HIGH SENSITIVITY)    EKG EKG Interpretation  Date/Time:  Monday August 15 2022 12:33:13 EDT Ventricular Rate:  85 PR Interval:  176 QRS Duration: 76 QT Interval:  372 QTC Calculation: 442 R Axis:   1 Text Interpretation: Normal sinus rhythm When compared with ECG of 01-May-2022 12:51, PREVIOUS ECG IS PRESENT Confirmed by Octaviano Glow [phone]) on  08/15/2022 6:42:25 PM  Radiology CT ANGIO HEAD NECK W WO CM  Result Date: 08/15/2022 CLINICAL DATA:  Headache and diplopia EXAM: CT ANGIOGRAPHY HEAD AND NECK TECHNIQUE: Multidetector CT imaging of the head and neck was performed using the standard protocol during bolus administration of intravenous contrast. Multiplanar CT image reconstructions and MIPs were obtained to evaluate the vascular anatomy. Carotid stenosis measurements (when applicable) are obtained utilizing NASCET criteria, using the distal internal carotid diameter as the denominator. RADIATION DOSE REDUCTION: This exam was performed according to the departmental dose-optimization program which includes automated exposure control, adjustment of the mA and/or kV according to patient size and/or use of iterative reconstruction technique. CONTRAST:  33m OMNIPAQUE IOHEXOL 350 MG/ML SOLN COMPARISON:  None Available. FINDINGS: CT HEAD FINDINGS Brain: There is no mass, hemorrhage or extra-axial collection. The size and configuration of the ventricles and extra-axial CSF spaces are normal. There is no acute or chronic infarction. The brain parenchyma is normal. Skull: The visualized skull base, calvarium and extracranial soft tissues are normal. Sinuses/Orbits: No fluid levels or advanced mucosal thickening of the visualized paranasal sinuses. No mastoid or middle ear effusion. The orbits are normal. CTA NECK FINDINGS SKELETON: There is no bony spinal canal stenosis. No lytic or blastic lesion. OTHER NECK: Normal pharynx, larynx and major salivary glands. No cervical lymphadenopathy. Unremarkable thyroid gland. UPPER CHEST: No pneumothorax or pleural effusion. No nodules or masses. AORTIC ARCH: There is no calcific atherosclerosis of the aortic arch. There is no aneurysm, dissection or hemodynamically significant stenosis of the visualized portion of the aorta. Conventional 3 vessel aortic branching pattern. The visualized proximal subclavian arteries  are widely patent. RIGHT CAROTID SYSTEM: No dissection, occlusion or aneurysm. Mild atherosclerotic calcification at the carotid bifurcation without hemodynamically significant stenosis. LEFT CAROTID SYSTEM: Normal without aneurysm, dissection or stenosis. VERTEBRAL ARTERIES: Left dominant configuration. Both origins are clearly patent. There is no dissection, occlusion or flow-limiting stenosis  to the skull base (V1-V3 segments). CTA HEAD FINDINGS Poor contrast bolus. POSTERIOR CIRCULATION: --Vertebral arteries: Normal V4 segments. --Inferior cerebellar arteries: Normal. --Basilar artery: Normal. --Superior cerebellar arteries: Normal. --Posterior cerebral arteries (PCA): Normal. ANTERIOR CIRCULATION: --Intracranial internal carotid arteries: Normal. --Anterior cerebral arteries (ACA): Normal. Both A1 segments are present. Patent anterior communicating artery (a-comm). --Middle cerebral arteries (MCA): Normal. VENOUS SINUSES: As permitted by contrast timing, patent. ANATOMIC VARIANTS: None Review of the MIP images confirms the above findings. IMPRESSION: 1. Poor contrast bolus. No emergent large vessel occlusion or high-grade stenosis of the intracranial arteries. 2. Normal CTA of the neck. Electronically Signed   By: Ulyses Jarred M.D.   On: 08/15/2022 22:05   MR BRAIN WO CONTRAST  Result Date: 08/15/2022 CLINICAL DATA:  Right-sided headache with blurry vision/diplopia in the right eye. Ptosis of the right eye. EXAM: MRI HEAD WITHOUT CONTRAST TECHNIQUE: Multiplanar, multiecho pulse sequences of the brain and surrounding structures were obtained without intravenous contrast. COMPARISON:  Same-day CT head FINDINGS: Brain: There is no acute intracranial hemorrhage, extra-axial fluid collection, or acute infarct. Parenchymal volume is normal. The ventricles are normal in size. Gray-white differentiation is preserved. There are a few tiny foci of FLAIR signal abnormality in the supratentorial white matter,  nonspecific but may reflect early/minimal chronic small vessel ischemic change There is no suspicious parenchymal signal abnormality. There is no mass lesion. There is no mass effect or midline shift. Vascular: Normal flow voids. Skull and upper cervical spine: Normal marrow signal. Sinuses/Orbits: The paranasal sinuses are clear. The globes and orbits are unremarkable. The optic nerves are grossly unremarkable on these nondedicated sequences. Other: None. IMPRESSION: Essentially normal for age brain MRI with no acute intracranial pathology to explain the patient's symptoms Electronically Signed   By: Valetta Mole M.D.   On: 08/15/2022 20:26   CT Head Wo Contrast  Result Date: 08/15/2022 CLINICAL DATA:  Headache with dizziness and photophobia EXAM: CT HEAD WITHOUT CONTRAST TECHNIQUE: Contiguous axial images were obtained from the base of the skull through the vertex without intravenous contrast. RADIATION DOSE REDUCTION: This exam was performed according to the departmental dose-optimization program which includes automated exposure control, adjustment of the mA and/or kV according to patient size and/or use of iterative reconstruction technique. COMPARISON:  03/22/22 FINDINGS: Brain: No evidence of acute infarction, hemorrhage, hydrocephalus, extra-axial collection or mass lesion/mass effect. Vascular: No hyperdense vessel or unexpected calcification. Skull: Normal. Negative for fracture or focal lesion. Sinuses/Orbits: No acute finding. Other: None IMPRESSION: No CT finding to explain headache, dizziness, or photophobia. Electronically Signed   By: Marin Roberts M.D.   On: 08/15/2022 13:58   DG Chest 2 View  Result Date: 08/15/2022 CLINICAL DATA:  Chest pain. EXAM: CHEST - 2 VIEW COMPARISON:  May 01, 2022. FINDINGS: The heart size and mediastinal contours are within normal limits. Both lungs are clear. The visualized skeletal structures are unremarkable. IMPRESSION: No active cardiopulmonary disease.  Electronically Signed   By: Marijo Conception M.D.   On: 08/15/2022 13:19    Procedures Procedures    Medications Ordered in ED Medications  iohexol (OMNIPAQUE) 350 MG/ML injection 75 mL (has no administration in time range)  prochlorperazine (COMPAZINE) injection 10 mg (has no administration in time range)  magnesium sulfate IVPB 2 g 50 mL (has no administration in time range)  sodium chloride 0.9 % bolus 1,000 mL (0 mLs Intravenous Stopped 08/15/22 2234)  diphenhydrAMINE (BENADRYL) injection 12.5 mg (12.5 mg Intravenous Given 08/15/22 2050)  metoCLOPramide (REGLAN)  injection 10 mg (10 mg Intravenous Given 08/15/22 2050)  iohexol (OMNIPAQUE) 350 MG/ML injection 75 mL (75 mLs Intravenous Contrast Given 08/15/22 2136)    ED Course/ Medical Decision Making/ A&P Clinical Course as of 08/15/22 2259  Mon Aug 15, 2022  2243 The case was discussed with the nephrologist Dr Lorrin Goodell by phone, who advised admission of the patient for potential stroke without radiographic imaging, patient to be transferred to [facility], and will have neurology evaluation.  In the meantime we can maintain him on Eliquis for now.  The patient was updated regarding this plan and in agreement. [MT]    Clinical Course User Index [MT] Daylynn Stumpp, Carola Rhine, MD                           Medical Decision Making Amount and/or Complexity of Data Reviewed Labs: ordered. Radiology: ordered.  Risk Prescription drug management. Decision regarding hospitalization.   This patient presents to the Emergency Department with complaint of headache.  This involves an extensive number of treatment options, and is a complaint that carries with it a high risk of complications and morbidity.  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  CTA head and  neck of been ordered.  As well as MRI of the brain, as he is several acute risk factors for stroke including diabetes, obesity, hypertension.  Patient is already on Eliquis and compliant with it.  His chest pain is also nonspecific.  Given that is waxing and waning and currently gone, have a low suspicion for ongoing pulmonary embolism or aortic dissection.  I personally viewed interpreted his x-ray which is unremarkable.  No evidence of pneumothorax or pneumonia or widened mediastinum.  His troponin is negative.  His EKG per my interpretation shows a sinus rhythm no acute ischemic findings.  I have a low suspicion for ACS.  He has no fever, leukocytosis, meningismus suggest acute bacterial meningitis; I do not believe he needs an LP at this time.  This presentation is also not consistent with optic neuritis, or temporal arteritis.  I ordered, reviewed, and interpreted labs, including BMP and CBC.  There were no immediate, life-threatening emergencies found in this labwork.   I ordered medication IV fluids, IV Reglan, IV Benadryl for headache and/or nausea I ordered imaging studies which included CT head, CTA head and neck, MRI of the brain I independently visualized and interpreted imaging which showed no emergent findings explain the patient's symptoms and the monitor tracing which showed normal sinus rhythm   I consulted neurology and discussed lab and imaging findings -see ED course  I reassessed the patient after his medications and his headache was unchanged.  Additional medications ordered with IV Compazine and magnesium for headache.  Disposition:  At this time, discussion with the neurologist as well as the patient, we have opted for medical admission.        Final Clinical Impression(s) / ED Diagnoses Final diagnoses:  Nonintractable headache, unspecified chronicity pattern, unspecified headache type  Blurred vision    Rx / DC Orders ED Discharge Orders     None          Jenevie Casstevens, Carola Rhine, MD 08/15/22 2259

## 2022-08-15 NOTE — ED Notes (Signed)
Labs/IV for CT 

## 2022-08-16 ENCOUNTER — Observation Stay (HOSPITAL_COMMUNITY): Payer: Medicaid Other

## 2022-08-16 DIAGNOSIS — H532 Diplopia: Secondary | ICD-10-CM | POA: Diagnosis not present

## 2022-08-16 LAB — GLUCOSE, CAPILLARY: Glucose-Capillary: 234 mg/dL — ABNORMAL HIGH (ref 70–99)

## 2022-08-16 LAB — SEDIMENTATION RATE: Sed Rate: 3 mm/hr (ref 0–16)

## 2022-08-16 LAB — CBG MONITORING, ED
Glucose-Capillary: 249 mg/dL — ABNORMAL HIGH (ref 70–99)
Glucose-Capillary: 322 mg/dL — ABNORMAL HIGH (ref 70–99)

## 2022-08-16 MED ORDER — ONDANSETRON HCL 4 MG PO TABS: 4.0000 mg | ORAL_TABLET | Freq: Four times a day (QID) | ORAL | Status: DC | PRN

## 2022-08-16 MED ORDER — PANTOPRAZOLE SODIUM 40 MG PO TBEC
40.0000 mg | DELAYED_RELEASE_TABLET | Freq: Every day | ORAL | Status: DC
  Administered 2022-08-16 – 2022-08-17 (×2): 40 mg via ORAL
  Filled 2022-08-16 (×2): qty 1

## 2022-08-16 MED ORDER — LORAZEPAM 0.5 MG PO TABS
0.5000 mg | ORAL_TABLET | Freq: Once | ORAL | Status: AC
  Administered 2022-08-16: 0.5 mg via ORAL
  Filled 2022-08-16: qty 1

## 2022-08-16 MED ORDER — OXYCODONE HCL 5 MG PO TABS
20.0000 mg | ORAL_TABLET | Freq: Four times a day (QID) | ORAL | Status: DC | PRN
  Administered 2022-08-16 – 2022-08-17 (×4): 20 mg via ORAL
  Filled 2022-08-16 (×4): qty 4

## 2022-08-16 MED ORDER — ACETAMINOPHEN 325 MG PO TABS
650.0000 mg | ORAL_TABLET | Freq: Four times a day (QID) | ORAL | Status: DC | PRN
  Administered 2022-08-16: 650 mg via ORAL
  Filled 2022-08-16: qty 2

## 2022-08-16 MED ORDER — GABAPENTIN 300 MG PO CAPS
600.0000 mg | ORAL_CAPSULE | Freq: Three times a day (TID) | ORAL | Status: DC
  Administered 2022-08-16 – 2022-08-17 (×2): 600 mg via ORAL
  Filled 2022-08-16 (×2): qty 2

## 2022-08-16 MED ORDER — INSULIN REGULAR HUMAN (CONC) 500 UNIT/ML ~~LOC~~ SOPN: 85.0000 [IU] | PEN_INJECTOR | SUBCUTANEOUS | Status: DC

## 2022-08-16 MED ORDER — LACTATED RINGERS IV SOLN: INTRAVENOUS | Status: DC

## 2022-08-16 MED ORDER — ACETAMINOPHEN 650 MG RE SUPP: 650.0000 mg | Freq: Four times a day (QID) | RECTAL | Status: DC | PRN

## 2022-08-16 MED ORDER — INSULIN ASPART 100 UNIT/ML IJ SOLN
0.0000 [IU] | Freq: Three times a day (TID) | INTRAMUSCULAR | Status: DC
  Administered 2022-08-16: 7 [IU] via SUBCUTANEOUS
  Administered 2022-08-16: 15 [IU] via SUBCUTANEOUS
  Administered 2022-08-17: 7 [IU] via SUBCUTANEOUS
  Filled 2022-08-16: qty 0.2

## 2022-08-16 MED ORDER — INSULIN REGULAR HUMAN (CONC) 500 UNIT/ML ~~LOC~~ SOPN
30.0000 [IU] | PEN_INJECTOR | Freq: Three times a day (TID) | SUBCUTANEOUS | Status: DC
  Administered 2022-08-16 – 2022-08-17 (×2): 30 [IU] via SUBCUTANEOUS
  Filled 2022-08-16: qty 3

## 2022-08-16 MED ORDER — ONDANSETRON HCL 4 MG/2ML IJ SOLN
4.0000 mg | Freq: Four times a day (QID) | INTRAMUSCULAR | Status: DC | PRN
  Administered 2022-08-16 – 2022-08-17 (×2): 4 mg via INTRAVENOUS
  Filled 2022-08-16 (×3): qty 2

## 2022-08-16 MED ORDER — AMLODIPINE BESYLATE 5 MG PO TABS
5.0000 mg | ORAL_TABLET | Freq: Every day | ORAL | Status: DC
  Administered 2022-08-16 – 2022-08-17 (×2): 5 mg via ORAL
  Filled 2022-08-16 (×2): qty 1

## 2022-08-16 NOTE — Plan of Care (Signed)
Please see neuro consult note from this AM. MRA head neg for aneurysm ESR normal Please f/u on CRP. If normal no further inpatient neurologic workup indicated, please place referral to outpatient neuro at hospital discharge.   Su Monks, MD [facility] [phone]  If 7pm- 7am, please page neurology on call as listed in [place].

## 2022-08-16 NOTE — H&P (Signed)
History and Physical    Patient: [name] VPX:[id number] DOB: 1961-03-25 DOA: 08/15/2022 DOS: the patient was seen and examined on 08/16/2022 PCP: Drue Flirt, MD  Patient coming from: Home  Chief Complaint:  Chief Complaint  Patient presents with   Shortness of Breath   Chest Pain   Headache   HPI: [name] is a 61 y.o. male with medical history significant of Anemia, osteoarthritis, back pain, CAD, history of stent placement, cervical radiculopathy, chronic diastolic CHF, chronic pain syndrome, depression, DVT/ history of PE on apixaban, UGI bleed, PUD, hematemesis, hepatic asteatosis, hyperlipidemia, hypertension, IBS, class III obesity, OSA, pancreatitis, peripheral neuropathy, Bell's palsy, history of other nonhemorrhagic stroke, thoracic aortic ataxia, uncontrolled type 2 diabetes who is coming to the emergency department due to diplopia for the past week associated with headache, blurry vision with difficulty opening his right eye.  He has had a mild gait abnormality, but also stated that this has been happening since he had his previous CVA. He denied fever, chills, rhinorrhea, sore throat, wheezing or hemoptysis.  He also endorsed dyspnea and precordial chest pain, but no palpitations, diaphoresis, PND, orthopnea or recent pitting edema of the lower extremities.  No abdominal pain, nausea, emesis, diarrhea, melena or hematochezia.  No flank pain, dysuria, frequency or hematuria.  No polyuria, polydipsia, polyphagia or blurred vision.   ED course: Initial vital signs were temperature 98.7 F, pulse 83, respiration 20, BP 142/78 mmHg O2 sat 92% on room air.  The patient received Benadryl 12.5 mg IVP, lorazepam 0.5 mg IVP, magnesium sulfate 2 g IVP, metoclopramide 10 mg IVP, Compazine 10 mg IVP and 1000 mL of normal saline bolus.  Lab work: CBC with a white count of 8.6, hemoglobin 12.3 g deciliter platelets 218.  Troponin and BNP were normal.  BMP showed glucose of  239, BUN 48 and creatinine 2.29 mg/deciliter.  Creatinine was 0.71 3 months ago. Electrolytes were normal.  Imaging: 2 view chest radiograph, CT head without contrast, MRI brain without contrast, CTA head/neck and MR angio head without contrast with no acute findings.  Please see images and full radiology reports for further details.   Review of Systems: As mentioned in the history of present illness. All other systems reviewed and are negative. Past Medical History:  Diagnosis Date   Anemia    Arthritis    Back pain    CAD (coronary artery disease)    a. s/p DES to LAD in 05/2016   Cervical radiculopathy    Chronic diastolic CHF (congestive heart failure) (HCC)    Chronic pain    Depression    DVT (deep venous thrombosis) (HCC)    Hematemesis    Hepatic steatosis    Hyperlipidemia    Hypertension    IBS (irritable bowel syndrome)    Morbid obesity (HCC)    OSA (obstructive sleep apnea)    Pancreatitis    PE (pulmonary thromboembolism) ([place])    Peripheral neuropathy    PUD (peptic ulcer disease)    Renal disorder    Stroke [facility])    a. ?details unclear - not seen on imaging when he was admitted in 05/2017 for TIA symptoms which were felt due to cervical radiculopathy.   Thoracic aortic ectasia (HCC)    a. 4.3cm ectatic ascending thoracic aorta by CT 06/2017.    Type 2 diabetes mellitus [facility])    Past Surgical History:  Procedure Laterality Date   CARDIAC CATHETERIZATION N/A 05/31/2016   Procedure: Left Heart  Cath and Coronary Angiography;  Surgeon: Peter M Martinique, MD;  Location: [place] CV LAB;  Service: Cardiovascular;  Laterality: N/A;   CARDIAC CATHETERIZATION N/A 05/31/2016   Procedure: Intravascular Pressure Wire/FFR Study;  Surgeon: Peter M Martinique, MD;  Location: [place] CV LAB;  Service: Cardiovascular;  Laterality: N/A;   CARDIAC CATHETERIZATION N/A 05/31/2016   Procedure: Coronary Stent Intervention;  Surgeon: Peter M Martinique, MD;  Location: [place] CV LAB;   Service: Cardiovascular;  Laterality: N/A;   COLONOSCOPY N/A 07/17/2021   Procedure: COLONOSCOPY;  Surgeon: Wilford Corner, MD;  Location: [facility] ENDOSCOPY;  Service: Endoscopy;  Laterality: N/A;   COLONOSCOPY WITH PROPOFOL N/A 04/29/2020   Procedure: COLONOSCOPY WITH PROPOFOL;  Surgeon: Wilford Corner, MD;  Location: [facility];  Service: Endoscopy;  Laterality: N/A;   ESOPHAGOGASTRODUODENOSCOPY N/A 04/29/2020   Procedure: ESOPHAGOGASTRODUODENOSCOPY (EGD);  Surgeon: Wilford Corner, MD;  Location: [place];  Service: Endoscopy;  Laterality: N/A;   ESOPHAGOGASTRODUODENOSCOPY (EGD) WITH PROPOFOL N/A 07/17/2021   Procedure: ESOPHAGOGASTRODUODENOSCOPY (EGD) WITH PROPOFOL;  Surgeon: Wilford Corner, MD;  Location: [facility]OS[facility];  Service: Endoscopy;  Laterality: N/A;   LEFT HEART CATH AND CORONARY ANGIOGRAPHY N/A 12/08/2017   Procedure: LEFT HEART CATH AND CORONARY ANGIOGRAPHY;  Surgeon: Leonie Man, MD;  Location: [place] CV LAB;  Service: Cardiovascular;  Laterality: N/A;   LEFT HEART CATHETERIZATION WITH CORONARY ANGIOGRAM N/A 02/03/2014   Procedure: LEFT HEART CATHETERIZATION WITH CORONARY ANGIOGRAM;  Surgeon: Pixie Casino, MD;  Location: [facility] CATH LAB;  Service: Cardiovascular;  Laterality: N/A;   left leg stent      POLYPECTOMY  04/29/2020   Procedure: POLYPECTOMY;  Surgeon: Wilford Corner, MD;  Location: [facility] ENDOSCOPY;  Service: Endoscopy;;   POLYPECTOMY  07/17/2021   Procedure: POLYPECTOMY;  Surgeon: Wilford Corner, MD;  Location: [facility] ENDOSCOPY;  Service: Endoscopy;;   Social History:  reports that he has never smoked. He has been exposed to tobacco smoke. He has never used smokeless tobacco. He reports that he does not drink alcohol and does not use drugs.  Allergies  Allergen Reactions   Coconut Flavor [Flavoring Agent] Hives   Ibuprofen Other (See Comments)    Made gastric ulcers worse   Aleve [Naproxen] Other (See Comments)    DUE TO KIDNEYS   Nsaids Other (See  Comments)    Stomach ulcers    Other Hives    Nut Allergy   Vascepa [Icosapent Ethyl] Other (See Comments)    headaches, chest pain, similar to sx of a stroke, hypotension     Family History  Problem Relation Age of Onset   Cancer Father    Hypertension Mother    Diabetes Mother    Breast cancer Mother    Hypertension Brother    Diabetes Brother    Hypertension Sister    Diabetes Sister     Prior to Admission medications   Medication Sig Start Date End Date Taking? Authorizing Provider  albuterol (VENTOLIN HFA) 108 (90 Base) MCG/ACT inhaler Inhale 2 puffs into the lungs every 6 (six) hours as needed for wheezing or shortness of breath.   Yes [provider]  apixaban (ELIQUIS) 5 MG TABS tablet Take 1 tablet (5 mg total) by mouth 2 (two) times daily. 07/06/22  Yes Wyatt Portela, MD  atorvastatin (LIPITOR) 40 MG tablet Take 40 mg by mouth at bedtime.   Yes [provider]  carvedilol (COREG) 6.25 MG tablet Take 1 tablet (6.25 mg total) by mouth 2 (two) times daily with  a meal. 03/26/22  Yes Hosie Poisson, MD  cyanocobalamin 1000 MCG tablet Take 1,000 mcg by mouth daily. 03/06/20  Yes [provider]  diclofenac (FLECTOR) 1.3 % PTCH Place 1 patch onto the skin 2 (two) times daily as needed (back and knee pain).   Yes [provider]  fenofibrate (TRICOR) 145 MG tablet TAKE 1 TABLET (145 MG TOTAL) BY MOUTH DAILY. 05/19/22  Yes Cleaver, Jossie Ng, NP  Fluticasone Propionate (FLONASE ALLERGY RELIEF NA) Place 1 spray into the nose daily as needed (congestion).   Yes [provider]  furosemide (LASIX) 40 MG tablet Take 40 mg by mouth 2 (two) times daily.   Yes [provider]  gabapentin (NEURONTIN) 800 MG tablet Take 800 mg by mouth 3 (three) times daily. 09/29/21  Yes [provider]  glipiZIDE (GLUCOTROL) 10 MG tablet Take 10 mg by mouth 2 (two) times daily. 05/30/17  Yes [provider]  HUMULIN R U-500 KWIKPEN 500 UNIT/ML  kwikpen Inject 85-110 Units into the skin See admin instructions. Per sliding scale 3 times daily with meals 06/04/20  Yes [provider]  isosorbide mononitrate (IMDUR) 30 MG 24 hr tablet TAKE 1 TABLET BY MOUTH DAILY Patient taking differently: Take 30 mg by mouth daily. 01/03/22  Yes Hilty, Nadean Corwin, MD  lisinopril (ZESTRIL) 20 MG tablet Take 20 mg by mouth daily.   Yes [provider]  Menthol, Topical Analgesic, (BIOFREEZE EX) Apply 1 application topically 2 (two) times daily as needed (knee pain).   Yes [provider]  nitroGLYCERIN (NITROSTAT) 0.4 MG SL tablet Place 1 tablet (0.4 mg total) under the tongue every 5 (five) minutes as needed for chest pain. 02/06/19  Yes Skeet Latch, MD  ondansetron (ZOFRAN) 4 MG tablet Take 4 mg by mouth 2 (two) times daily as needed for nausea/vomiting, vomiting or nausea. 04/21/20  Yes [provider]  Oxycodone HCl 20 MG TABS Take 20 mg by mouth 4 (four) times daily as needed (pain).   Yes [provider]  pantoprazole (PROTONIX) 40 MG tablet Take 1 tablet by mouth daily. 05/26/22  Yes [provider]  potassium chloride (MICRO-K) 10 MEQ CR capsule Take 2 capsules (20 mEq total) by mouth 2 (two) times daily. 07/21/22  Yes Lelon Perla, MD  sertraline (ZOLOFT) 50 MG tablet Take 50 mg by mouth every morning. 12/17/20  Yes [provider]  tiZANidine (ZANAFLEX) 4 MG tablet Take 4 mg by mouth at bedtime as needed for muscle spasms. 03/31/20  Yes [provider]  traZODone (DESYREL) 50 MG tablet Take 1 tablet (50 mg total) by mouth at bedtime. 12/15/16  Yes Burns, Arloa Koh, MD  Vitamin D, Ergocalciferol, (DRISDOL) 1.25 MG (50000 UT) CAPS capsule Take 50,000 Units by mouth every Monday. 08/15/19  Yes [provider]  ACCU-CHEK GUIDE test strip  05/19/22   [provider]  Continuous Blood Gluc Sensor (FREESTYLE LIBRE 2 SENSOR) MISC  05/02/22   [provider]  Insulin  Pen Needle 31G X 5 MM MISC Use 1 needle daily to inject insulin as prescribed 06/18/17   Barton Dubois, MD    Physical Exam: Vitals:   08/16/22 0325 08/16/22 0400 08/16/22 0515 08/16/22 0738  BP: (!) 190/79 (!) 134/99  139/77  Pulse: 67 68    Resp: 18   18  Temp:   98 F (36.7 C)   TempSrc:   Oral   SpO2: 100% 96%    Weight:  Height:       Physical Exam Vitals and nursing note reviewed.  Constitutional:      Appearance: He is well-developed. He is obese.  HENT:     Head: Normocephalic.     Nose: No rhinorrhea.     Mouth/Throat:     Mouth: Mucous membranes are dry.  Eyes:     General: No scleral icterus.    Pupils: Pupils are equal, round, and reactive to light.  Neck:     Vascular: No JVD.  Cardiovascular:     Rate and Rhythm: Normal rate and regular rhythm.  Pulmonary:     Effort: Pulmonary effort is normal. No tachypnea.     Breath sounds: Normal breath sounds. No wheezing, rhonchi or rales.  Abdominal:     General: Bowel sounds are normal.     Palpations: Abdomen is soft.  Musculoskeletal:     Cervical back: Neck supple. No rigidity.     Right lower leg: No edema.     Left lower leg: No edema.  Skin:    General: Skin is warm and dry.  Neurological:     Mental Status: He is alert and oriented to person, place, and time.  Psychiatric:        Mood and Affect: Mood normal.        Behavior: Behavior normal.   Data Reviewed:  There are no new results to review at this time.  Assessment and Plan: Principal Problem:   Diplopia Associated with:   Blurred vision Work-up and imaging benign. Discussed with neurology (Dr. Quinn Axe). Patient to follow-up at the neurology clinic.  Active Problems: He will remain in the hospital due to:   Acute kidney injury ([place]) Continue IV fluids. Hold ARB/ACE. Hold diuretic. Avoid hypotension. Avoid nephrotoxins. Monitor intake and output. Monitor renal function electrolytes.    Dyslipidemia Continue atorvastatin and  fenofibrate.    HTN (hypertension) Continue carvedilol 6.25 mg p.o. twice daily.    Coronary artery disease involving native  coronary artery of native heart without angina pectoris Continue beta-blocker, nitrates, statin and apixaban.    Chest pain BNP/troponin normal EKG nonischemic. Normal sinus rhythm.    Depression Continue sertraline and trazodone.    GERD (gastroesophageal reflux disease) Pantoprazole 40 mg p.o. daily.    Uncontrolled type 2 diabetes mellitus with hyperglycemia (HCC) Carbohydrate modified diet. Diabetes coordinator recommended 30 units 3 times daily plus R-ISS.    Chronic diastolic CHF (congestive heart failure) ([place]) Patient is dry. No signs of decompensation. Holding diuretic and ACE inhibitor. Continue beta-blocker and nitrates.    OSA (obstructive sleep apnea) CPAP at bedtime.    Diabetic peripheral neuropathy (HCC)   Cervical radiculopathy   Carpal tunnel syndrome on both sides   Degenerative joint disease involving multiple joints Continue gabapentin 800 mg p.o. 3 times daily. Continue analgesics as needed.    Deep vein thrombosis (HCC) With history of pulmonary embolism. Continue apixaban 5 mg p.o. twice daily.    Class 3 obesity (HCC) Current BMI 46.63 kg/m. Lifestyle modifications. Follow-up with primary care provider.      Advance Care Planning:   Code Status:   Consults: Neuro hospitalist team.  Family Communication:   Severity of Illness: The appropriate patient status for this patient is OBSERVATION. Observation status is judged to be reasonable and necessary in order to provide the required intensity of service to ensure the patient's safety. The patient's presenting symptoms, physical exam findings, and initial radiographic and laboratory data in the context of  their medical condition is felt to place them at decreased risk for further clinical deterioration. Furthermore, it is anticipated that the patient will be  medically stable for discharge from the hospital within 2 midnights of admission.   Author: Reubin Milan, MD 08/16/2022 7:43 AM  For on call review [email or web address].   This document was prepared using Dragon voice recognition software and may contain some unintended transcription errors.

## 2022-08-16 NOTE — Consult Note (Signed)
NEUROLOGY CONSULTATION NOTE   Date of service: August 16, 2022 Patient Name: [name] MRN:  [id number] DOB:  11-Aug-1961 Reason for consult: "diplopia, headache" Requesting Provider: Kayleen Memos, DO _ _ _   _ __   _ __ _ _  __ __   _ __   __ _  History of Present Illness  [name] is a 61 y.o. male with PMH significant for CAD, HLD, HTN, morbid obesity, OSA not on CPAP, poorly controlled diabetes, prior history of strokes, PEs on anticoagulation who presents with about a 12-day history of diplopia which is worse when he looks to his left along with headache and difficulty opening his right eye. His symptoms were persistent so he finally decided to come to the ED for further evaluation and work-up.  Reports no prior history of similar symptoms.  Does endorse history of Bell's palsy but with no residual deficit.  He reports feeling off balance when he closes one of the eyes, balance is significantly improved. Has mild residual balance issues from prior stroke.  No history of aneurysms, no family history of aneurysms.  No prior history of similar symptoms.   ROS   Constitutional Denies weight loss, fever and chills.   HEENT Diplopia with intact hearing.   Respiratory Denies SOB and cough.   CV Denies palpitations and CP   GI Denies abdominal pain, nausea, vomiting and diarrhea.   GU Denies dysuria and urinary frequency.   MSK Denies myalgia and joint pain.   Skin Denies rash and pruritus.   Neurological Denies headache and syncope.   Psychiatric Denies recent changes in mood. Denies anxiety and depression.    Past History   Past Medical History:  Diagnosis Date   Anemia    Arthritis    Back pain    CAD (coronary artery disease)    a. s/p DES to LAD in 05/2016   Cervical radiculopathy    Chronic diastolic CHF (congestive heart failure) (HCC)    Chronic pain    Depression    DVT (deep venous thrombosis) (HCC)    Hematemesis    Hepatic steatosis     Hyperlipidemia    Hypertension    IBS (irritable bowel syndrome)    Morbid obesity (HCC)    OSA (obstructive sleep apnea)    Pancreatitis    PE (pulmonary thromboembolism) ([place])    Peripheral neuropathy    PUD (peptic ulcer disease)    Renal disorder    Stroke [facility])    a. ?details unclear - not seen on imaging when he was admitted in 05/2017 for TIA symptoms which were felt due to cervical radiculopathy.   Thoracic aortic ectasia (HCC)    a. 4.3cm ectatic ascending thoracic aorta by CT 06/2017.    Type 2 diabetes mellitus ([place])    Past Surgical History:  Procedure Laterality Date   CARDIAC CATHETERIZATION N/A 05/31/2016   Procedure: Left Heart Cath and Coronary Angiography;  Surgeon: Peter M Martinique, MD;  Location: [place] CV LAB;  Service: Cardiovascular;  Laterality: N/A;   CARDIAC CATHETERIZATION N/A 05/31/2016   Procedure: Intravascular Pressure Wire/FFR Study;  Surgeon: Peter M Martinique, MD;  Location: [place] CV LAB;  Service: Cardiovascular;  Laterality: N/A;   CARDIAC CATHETERIZATION N/A 05/31/2016   Procedure: Coronary Stent Intervention;  Surgeon: Peter M Martinique, MD;  Location: [place] CV LAB;  Service: Cardiovascular;  Laterality: N/A;   COLONOSCOPY N/A 07/17/2021   Procedure: COLONOSCOPY;  Surgeon: Wilford Corner,  MD;  Location: [facility] END[facility];  Service: Endoscopy;  Laterality: N/A;   COLONOSCOPY WITH PROPOFOL N/A 04/29/2020   Procedure: COLONOSCOPY WITH PROPOFOL;  Surgeon: Wilford Corner, MD;  Location: [place];  Service: Endoscopy;  Laterality: N/A;   ESOPHAGOGASTRODUODENOSCOPY N/A 04/29/2020   Procedure: ESOPHAGOGASTRODUODENOSCOPY (EGD);  Surgeon: Wilford Corner, MD;  Location: [place];  Service: Endoscopy;  Laterality: N/A;   ESOPHAGOGASTRODUODENOSCOPY (EGD) WITH PROPOFOL N/A 07/17/2021   Procedure: ESOPHAGOGASTRODUODENOSCOPY (EGD) WITH PROPOFOL;  Surgeon: Wilford Corner, MD;  Location: [facility];  Service: Endoscopy;  Laterality: N/A;   LEFT HEART  CATH AND CORONARY ANGIOGRAPHY N/A 12/08/2017   Procedure: LEFT HEART CATH AND CORONARY ANGIOGRAPHY;  Surgeon: Leonie Man, MD;  Location: [place] CV LAB;  Service: Cardiovascular;  Laterality: N/A;   LEFT HEART CATHETERIZATION WITH CORONARY ANGIOGRAM N/A 02/03/2014   Procedure: LEFT HEART CATHETERIZATION WITH CORONARY ANGIOGRAM;  Surgeon: Pixie Casino, MD;  Location: [facility] CATH LAB;  Service: Cardiovascular;  Laterality: N/A;   left leg stent      POLYPECTOMY  04/29/2020   Procedure: POLYPECTOMY;  Surgeon: Wilford Corner, MD;  Location: [facility] ENDOSCOPY;  Service: Endoscopy;;   POLYPECTOMY  07/17/2021   Procedure: POLYPECTOMY;  Surgeon: Wilford Corner, MD;  Location: [facility];  Service: Endoscopy;;   Family History  Problem Relation Age of Onset   Cancer Father    Hypertension Mother    Diabetes Mother    Breast cancer Mother    Hypertension Brother    Diabetes Brother    Hypertension Sister    Diabetes Sister    Social History   Socioeconomic History   Marital status: Single    Spouse name: Not on file   Number of children: 3   Years of education: 12   Highest education level: Not on file  Occupational History   Occupation: Disabled  Tobacco Use   Smoking status: Never    Passive exposure: Past   Smokeless tobacco: Never  Vaping Use   Vaping Use: Never used  Substance and Sexual Activity   Alcohol use: No   Drug use: No   Sexual activity: Not on file  Other Topics Concern   Not on file  Social History Narrative   Independent and ambulatory with cane.   Lives at home alone.   Right-handed.   1-2 cups caffeine per day.   Social Determinants of Health   Financial Resource Strain: Not on file  Food Insecurity: Not on file  Transportation Needs: Not on file  Physical Activity: Not on file  Stress: Not on file  Social Connections: Not on file   Allergies  Allergen Reactions   Coconut Flavor [Flavoring Agent] Hives   Ibuprofen Other (See Comments)     Made gastric ulcers worse   Aleve [Naproxen] Other (See Comments)    DUE TO KIDNEYS   Nsaids Other (See Comments)    Stomach ulcers    Other Hives    Nut Allergy   Vascepa [Icosapent Ethyl] Other (See Comments)    headaches, chest pain, similar to sx of a stroke, hypotension     Medications  (Not in a hospital admission)    Vitals   Vitals:   08/15/22 2200 08/16/22 0016 08/16/22 0030 08/16/22 0325  BP: (!) 159/63  (!) 167/136 (!) 190/79  Pulse: 74  83 67  Resp: 17   18  Temp:  98 F (36.7 C)    TempSrc:      SpO2: 98%  95% 100%  Weight:  Height:         Body mass index is 46.63 kg/m.  Physical Exam   General: Laying comfortably in bed; in no acute distress.  HENT: Normal oropharynx and mucosa. Normal external appearance of ears and nose.  Neck: Supple, no pain or tenderness  CV: No JVD. No peripheral edema.  Pulmonary: Symmetric Chest rise. Normal respiratory effort.  Abdomen: Soft to touch, non-tender.  Ext: No cyanosis, edema, or deformity  Skin: No rash. Normal palpation of skin.   Musculoskeletal: Normal digits and nails by inspection. No clubbing.   Neurologic Examination  Mental status/Cognition: Alert, oriented to self, place, month and year, good attention.  Speech/language: Fluent, comprehension intact, object naming intact, repetition intact.  Cranial nerves:   CN II Pupils equal and reactive to light, no VF deficits    CN III,IV,VI Partial right CN III palsy with ptosis noted on gaze exam.   CN V normal sensation in V1, V2, and V3 segments bilaterally    CN VII no asymmetry, no nasolabial fold flattening    CN VIII normal hearing to speech    CN IX & X normal palatal elevation, no uvular deviation    CN XI 5/5 head turn and 5/5 shoulder shrug bilaterally    CN XII midline tongue protrusion    Motor:  Muscle bulk: normal, tone normal, pronator drift normal tremor none Mvmt Root Nerve  Muscle Right Left Comments  SA C5/6 Ax Deltoid 5 5   EF  C5/6 Mc Biceps 5 5   EE C6/7/8 Rad Triceps 5 5   WF C6/7 Med FCR     WE C7/8 PIN ECU     F Ab C8/T1 U ADM/FDI 5 5   HF L1/2/3 Fem Illopsoas 5 5   KE L2/3/4 Fem Quad 5 5   DF L4/5 D Peron Tib Ant 5 5   PF S1/2 Tibial Grc/Sol 5 5    Sensation:  Light touch Intact throughout   Pin prick    Temperature    Vibration   Proprioception    Coordination/Complex Motor:  - Finger to Nose intact BL - Heel to shin intact BL - Rapid alternating movement are normal - Gait: Stride length normal. Arm swing normal. Base width narrow.  Labs   CBC:  Recent Labs  Lab 08/15/22 1400  WBC 8.6  HGB 12.3*  HCT 38.6*  MCV 92.1  PLT 160    Basic Metabolic Panel:  Lab Results  Component Value Date   NA 136 08/15/2022   K 4.7 08/15/2022   CO2 24 08/15/2022   GLUCOSE 239 (H) 08/15/2022   BUN 48 (H) 08/15/2022   CREATININE 2.29 (H) 08/15/2022   CALCIUM 9.2 08/15/2022   GFRNONAA 32 (L) 08/15/2022   GFRAA 110 11/02/2020   Lipid Panel:  Lab Results  Component Value Date   LDLCALC 31 12/27/2021   HgbA1c:  Lab Results  Component Value Date   HGBA1C 9.3 (H) 01/12/2022   Urine Drug Screen:     Component Value Date/Time   LABOPIA NONE DETECTED 12/07/2017 1633   COCAINSCRNUR NONE DETECTED 12/07/2017 1633   LABBENZ NONE DETECTED 12/07/2017 1633   AMPHETMU NONE DETECTED 12/07/2017 1633   THCU NONE DETECTED 12/07/2017 1633   LABBARB NONE DETECTED 12/07/2017 1633    Alcohol Level     Component Value Date/Time   ETH <5 01/22/2017 1225    CT Head without contrast(Personally reviewed): CTH was negative for a large hypodensity concerning for a large territory  infarct or hyperdensity concerning for an ICH  CT angio Head and Neck with contrast(Personally reviewed): Limited by bolus timing and hard to rule out aneurysm.  MR Angio head without contrast: pending  MRI Brain(Personally reviewed): Negative for an acute stroke.  Impression   [name] is a 61 y.o. male with PMH  significant for CAD, HLD, HTN, morbid obesity, OSA not on CPAP, poorly controlled diabetes, prior history of strokes, PEs on anticoagulation who presents with about a 12-day history of diplopia in both primary gaze and worse with looking to the left along with R upper eyelid ptosis. 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  CTA limited with no obvious aneurysm.  Etiology is likely ischemic injury in the setting of poorly controlled DM2, HTN, HLD, OSA. Could also be a small stroke in the posterior circulation but suspicion low given MRI Brain negative and presentation more consistent with an isolated CN3 palsy.  No jaw claudication, no fever, no temple tenderness concerning for giant cell arteritis. Vision is intact with no peripheral visual field loss on examining both eyes separately.  Recommendations  - Recommend vascular risk factor control including better control of DM2, HTN, HLD and compliance with CPAP for OSA. - ESR and CRP - CTA limited for evaluation of aneurysm by the timing of contrast bolus, will get MR Angio head to evaluate for aneurysm. - follow up with neurology outpatient. ______________________________________________________________________   Thank you for the opportunity to take part in the care of this patient. If you have any further questions, please contact the neurology consultation attending.  Signed,  [place] Pager Number [phone] _ _ _   _ __   _ __ _ _  __ __   _ __   __ _

## 2022-08-16 NOTE — ED Notes (Signed)
Pt in MRI.

## 2022-08-16 NOTE — ED Notes (Addendum)
Pt will return after MRI

## 2022-08-16 NOTE — Inpatient Diabetes Management (Signed)
Inpatient Diabetes Program Recommendations  AACE/ADA: New Consensus Statement on Inpatient Glycemic Control (2015)  Target Ranges:  Prepandial:   less than 140 mg/dL      Peak postprandial:   less than 180 mg/dL (1-2 hours)      Critically ill patients:  140 - 180 mg/dL   Lab Results  Component Value Date   GLUCAP 249 (H) 08/16/2022   HGBA1C 9.3 (H) 01/12/2022    Review of Glycemic Control  Latest Reference Range & Units 03/26/22 03:18 03/26/22 08:07 08/16/22 08:04  Glucose-Capillary 70 - 99 mg/dL 230 (H) 225 (H) 249 (H)  (H): Data is abnormally high Diabetes history: Type 2 Dm Outpatient Diabetes medications: Glipizide 10 mg BID, U-500 90/95/85 B/L/D Current orders for Inpatient glycemic control: U-500 85-110 per instructions, Glipizide 10 mg BId A1C pending  Inpatient Diabetes Program Recommendations:    Patient is followed by Peri [facility], outpatient endocrinology with last appointment on 10/19.   At this time recommend changing U-500 to set doses while inpatient for safety. Consider U-500 30 units TID (assuming patient is consuming >50% of meals and adding Novolog 0-20 units TID & Hs.   Thanks, Bronson Curb, MSN, RNC-OB Diabetes Coordinator [phone] (8a-5p)

## 2022-08-16 NOTE — Progress Notes (Signed)
Pt refused cpap tonight, prefers to wear nasal cannula instead.  Pt stated he hasn't worn one in about two years and wears 4L nasal cannula at home at night instead.  Pt was advised that RT is available all night should he change his mind about cpap.

## 2022-08-17 DIAGNOSIS — H532 Diplopia: Secondary | ICD-10-CM | POA: Diagnosis not present

## 2022-08-17 LAB — COMPREHENSIVE METABOLIC PANEL
ALT: 20 U/L (ref 0–44)
AST: 22 U/L (ref 15–41)
Albumin: 3.9 g/dL (ref 3.5–5.0)
Alkaline Phosphatase: 37 U/L — ABNORMAL LOW (ref 38–126)
Anion gap: 9 (ref 5–15)
BUN: 31 mg/dL — ABNORMAL HIGH (ref 6–20)
CO2: 27 mmol/L (ref 22–32)
Calcium: 9.6 mg/dL (ref 8.9–10.3)
Chloride: 99 mmol/L (ref 98–111)
Creatinine, Ser: 1.27 mg/dL — ABNORMAL HIGH (ref 0.61–1.24)
GFR, Estimated: >60 mL/min (ref >60)
Glucose, Bld: 213 mg/dL — ABNORMAL HIGH (ref 70–99)
Potassium: 4.3 mmol/L (ref 3.5–5.1)
Sodium: 135 mmol/L (ref 135–145)
Total Bilirubin: 0.8 mg/dL (ref 0.3–1.2)
Total Protein: 7.7 g/dL (ref 6.5–8.1)

## 2022-08-17 LAB — CBC
HCT: 38.6 % — ABNORMAL LOW (ref 39.0–52.0)
Hemoglobin: 12.2 g/dL — ABNORMAL LOW (ref 13.0–17.0)
MCH: 29.8 pg (ref 26.0–34.0)
MCHC: 31.6 g/dL (ref 30.0–36.0)
MCV: 94.1 fL (ref 80.0–100.0)
Platelets: 214 10*3/uL (ref 150–400)
RBC: 4.1 MIL/uL — ABNORMAL LOW (ref 4.22–5.81)
RDW: 14.9 % (ref 11.5–15.5)
WBC: 8.6 10*3/uL (ref 4.0–10.5)
nRBC: 0 % (ref 0.0–0.2)

## 2022-08-17 LAB — GLUCOSE, CAPILLARY
Glucose-Capillary: 210 mg/dL — ABNORMAL HIGH (ref 70–99)
Glucose-Capillary: 189 mg/dL — ABNORMAL HIGH (ref 70–99)

## 2022-08-17 LAB — HEMOGLOBIN A1C
Hgb A1c MFr Bld: 9.2 % — ABNORMAL HIGH (ref 4.8–5.6)
Mean Plasma Glucose: 217.34 mg/dL

## 2022-08-17 NOTE — Progress Notes (Signed)
Mobility Specialist - Progress Note   08/17/22 0938  Mobility  Activity Ambulated with assistance in hallway  Level of Assistance Standby assist, set-up cues, supervision of patient - no hands on  Assistive Device None  Distance Ambulated (ft) 500 ft  Activity Response Tolerated well  Mobility Referral Yes  $Mobility charge 1 Mobility   Pt received in chair and agreed to mobility, no c/o pain, some dizziness when turning and still had double vision in eye. Pt back to chair with all needs met and staff in room.  Roderick Pee Mobility Specialist

## 2022-08-17 NOTE — TOC Initial Note (Signed)
Transition of Care [facility]) - Initial/Assessment Note    Patient Details  Name: [name] MRN: [id number] Date of Birth: 19-Mar-1961  Transition [facility]) CM/SW Contact:    Henrietta Dine, RN Phone Number: 08/17/2022, 10:24 AM  Clinical Narrative:                  Transition of Care [facility]) Screening Note   Patient Details  Name: [name] Date of Birth: 1960/12/30   Transition of Care [facility]) CM/SW Contact:    Henrietta Dine, RN Phone Number: 08/17/2022, 10:24 AM    Transition of Care Department [facility]) has reviewed patient and no TOC needs have been identified at this time. We will continue to monitor patient advancement through interdisciplinary progression rounds. If new patient transition needs arise, please place a TOC consult.          Patient Goals and CMS Choice        Expected Discharge Plan and Services           Expected Discharge Date: 08/17/22                                    Prior Living Arrangements/Services                       Activities of Daily Living Home Assistive Devices/Equipment: Eyeglasses, CBG Meter, Cane (specify quad or straight), Walker (specify type) ADL Screening (condition at time of admission) Patient's cognitive ability adequate to safely complete daily activities?: Yes Is the patient deaf or have difficulty hearing?: No Does the patient have difficulty seeing, even when wearing glasses/contacts?: No Does the patient have difficulty concentrating, remembering, or making decisions?: No Patient able to express need for assistance with ADLs?: Yes Does the patient have difficulty dressing or bathing?: No Independently performs ADLs?: Yes (appropriate for developmental age) Does the patient have difficulty walking or climbing stairs?: Yes Weakness of Legs: Both Weakness of Arms/Hands: Both  Permission Sought/Granted                  Emotional Assessment               Admission diagnosis:  Diplopia [H53.2] Blurred vision [H53.8] Nonintractable headache, unspecified chronicity pattern, unspecified headache type [R51.9] Patient Active Problem List   Diagnosis Date Noted   Class 3 obesity ([place]) 08/16/2022   Diplopia 08/15/2022   Open wound of right axillary region 06/23/2022   Abscess of axillary fold 14/78/2956   Acute metabolic encephalopathy 21/30/8657   Blood per rectum 03/22/2022   Acute upper GI bleed 03/22/2022   Carpal tunnel syndrome on both sides 03/14/2022   Acute kidney injury ([place]) 01/12/2022   GI bleed 07/15/2021   Pulmonary embolism ([place]) 07/15/2021   Acute pulmonary embolism ([place]) 06/26/2021   Diabetic ulcer of lower leg ([place]) 06/26/2021   Acute respiratory failure with hypoxia ([place]) 06/26/2021   COVID-19 virus infection 06/26/2021   Lumbar radiculitis 03/30/2021   Morbid obesity ([place]) 01/15/2021   Personal history of colonic polyps 01/15/2021   Rectal bleeding 01/15/2021   Pain due to onychomycosis of toenails of both feet 01/15/2021   Epistaxis, recurrent 10/21/2020   Laceration of nose 10/21/2020   Syncope 10/15/2020   OSA (obstructive sleep apnea)    Bilateral carotid artery stenosis 07/28/2020   Bilateral lower extremity edema 07/28/2020   CHF (congestive heart failure) ([place])  07/28/2020   DDD (degenerative disc disease), lumbar 07/28/2020   Diabetic peripheral neuropathy ([place]) 07/28/2020   Fatty liver 07/28/2020   Irritable bowel syndrome with diarrhea 07/28/2020   Osteoarthritis of right hip 07/28/2020   Upper GI bleed 04/28/2020   Hypertensive urgency 04/27/2020   Acute GI bleeding 04/27/2020   Mixed diabetic hyperlipidemia associated with type 2 diabetes mellitus ([place]) 04/27/2020   Rotator cuff arthropathy 10/30/2019   Cervical radiculopathy 09/24/2019   Trochanteric bursitis of right hip 09/24/2019   Chronic diastolic CHF (congestive heart failure) ([place]) 08/23/2019   ARF (acute renal failure) ([place])  08/01/2019   Hyperkalemia 08/01/2019   Uncontrolled type 2 diabetes mellitus with hyperglycemia ([place]) 08/01/2019   AKI (acute kidney injury) ([place])    Hypotension 07/31/2019   DKA (diabetic ketoacidosis) ([place]) 05/05/2019   Blurred vision 05/05/2019   Trochanteric bursitis 02/11/2019   Long-term insulin use ([place]) 01/25/2019   Alteration consciousness 11/19/2018   Neck pain 10/09/2018   Left arm weakness 10/09/2018   Decreased sensation of lower extremity 09/12/2018   B12 deficiency 08/10/2017   Persistent headaches 08/09/2017   GERD (gastroesophageal reflux disease) 06/29/2017   Left arm numbness    History of CVA (cerebrovascular accident) 06/17/2017   TIA (transient ischemic attack) 06/17/2017   Degenerative joint disease involving multiple joints 04/09/2017   Foraminal stenosis of lumbar region 01/19/2017   Chronic back pain 12/29/2016   Depression 12/15/2016   Vitamin D deficiency 12/09/2016   Right hip pain 12/07/2016   Hypokalemia 12/07/2016   Type 2 diabetes mellitus with vascular disease ([place]) 05/31/2016   Normocytic normochromic anemia 05/31/2016   Chest pain 05/31/2016   Coronary artery disease involving native coronary artery of native heart without angina pectoris 01/06/2016   Deep vein thrombosis ([place]) 01/06/2016   Lactic acidosis 05/28/2014   Chronic pancreatitis ([place]) 03/21/2014   Edema 03/21/2014   Nonspecific chest pain 01/29/2014   DM2 (diabetes mellitus, type 2) ([place]) 01/29/2014   Obesity, Class III, BMI 40-49.9 (morbid obesity) ([place]) 01/29/2014   Snoring 01/29/2014   Dyslipidemia 01/29/2014   HTN (hypertension) 01/29/2014   Abnormal nuclear stress test 01/29/2014   Benign essential hypertension 08/15/2013   PCP:  Drue Flirt, MD Pharmacy:   [id number] [place] [address] [place]Phone: [phone]: [phone] [place]Phone: [phone]: [address]Phone: [phone]: [phone]    Social Determinants of Health (SDOH) Interventions    Readmission Risk Interventions    06/29/2021   11:43 AM  Readmission Risk Prevention Plan  Transportation Screening Complete  Medication Review ([place] Complete  PCP or Specialist appointment within 3-5 days of discharge Complete  HRI or [place]  SW Recovery Care/Counseling Consult Complete  Palliative Care Screening Not [place] Applicable

## 2022-08-17 NOTE — Final Progress Note (Signed)
Pt discharged home, AVS reviewed with pt, no medication changes.  No questions or concerns at time of discharge.  IV removed without complications.  Materials had no eye patch in stock; secured gauze over pt's R eye per physician recommendation for diplopia.  Pt states he is going to drive home and feels comfortable doing so.

## 2022-08-17 NOTE — Discharge Summary (Signed)
Physician Discharge Summary  [name] ZOX:[id number] DOB: 01-28-1961 DOA: 08/15/2022  PCP: Drue Flirt, MD  Admit date: 08/15/2022 Discharge date: 08/17/2022  Admitted From: Home Disposition: Home  Recommendations for Outpatient Follow-up:  Follow up with PCP in 1-2 weeks We will send neurology referral Please call and schedule follow-up with ophthalmology  Home Health: N/A Equipment/Devices: N/A  Discharge Condition: Stable CODE STATUS: Full code Diet recommendation: Low-salt and low-carb diet  Discharge summary: 61 year old gentleman with extensive cardiovascular history including coronary artery disease, cervical radiculopathy, chronic diastolic congestive heart failure, chronic pain syndrome, depression, 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  He has history of stroke in the past.  He was observed in the hospital and underwent extensive examination and testing. CT scan of the head CT angiogram of the head and neck MR angiogram of the head were essentially normal.  No evidence of acute to stroke.  No evidence of aneurysmal dilatation or vascular abnormalities.  On exam he had right 3rd nerve palsy.  Diplopia improved on closing right eye.  No other neurological deficits.  Seen by neurology. Patient was found to have acute kidney injury, was treated with IV fluids overnight with clinical improvement.  Renal functions normalized.  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  He will continue all his antihypertensives and recently changed to regimen of insulin.  He will follow-up with neurology and ophthalmology as outpatient.  No changes in medications were done.   Discharge Diagnoses:  Principal Problem:   Diplopia Active Problems:    Dyslipidemia   HTN (hypertension)   Coronary artery disease involving native coronary artery of native heart without angina pectoris   Chest pain   Depression   GERD (gastroesophageal reflux disease)   Blurred vision   Uncontrolled type 2 diabetes mellitus with hyperglycemia (HCC)   Chronic diastolic CHF (congestive heart failure) (HCC)   OSA (obstructive sleep apnea)   Diabetic peripheral neuropathy (HCC)   Cervical radiculopathy   Acute kidney injury ([place])   Carpal tunnel syndrome on both sides   Deep vein thrombosis (HCC)   Degenerative joint disease involving multiple joints   Class 3 obesity ([place])    Discharge Instructions  Discharge Instructions     Ambulatory referral to Neurology   Complete by: As directed    An appointment is requested in approximately: 4 weeks   Diet - low sodium heart healthy   Complete by: As directed    Diet Carb Modified   Complete by: As directed    Discharge instructions   Complete by: As directed    Call and schedule follow up with your eye doctor   Increase activity slowly   Complete by: As directed       Allergies as of 08/17/2022       Reactions   Coconut Flavor [flavoring Agent] Hives   Ibuprofen Other (See Comments)   Made gastric ulcers worse   Aleve [naproxen] Other (See Comments)   DUE TO KIDNEYS   Nsaids Other (See Comments)   Stomach ulcers   Other Hives   Nut Allergy   Vascepa [icosapent Ethyl] Other (See Comments)   headaches, chest pain, similar to sx of a stroke, hypotension         Medication List     TAKE these medications  Accu-Chek Guide test strip Generic drug: glucose blood   albuterol 108 (90 Base) MCG/ACT inhaler Commonly known as: VENTOLIN HFA Inhale 2 puffs into the lungs every 6 (six) hours as needed for wheezing or shortness of breath.   apixaban 5 MG Tabs tablet Commonly known as: ELIQUIS Take 1 tablet (5 mg total) by mouth 2 (two) times daily.   atorvastatin 40 MG tablet Commonly  known as: LIPITOR Take 40 mg by mouth at bedtime.   BIOFREEZE EX Apply 1 application topically 2 (two) times daily as needed (knee pain).   carvedilol 6.25 MG tablet Commonly known as: COREG Take 1 tablet (6.25 mg total) by mouth 2 (two) times daily with a meal.   cyanocobalamin 1000 MCG tablet Take 1,000 mcg by mouth daily.   diclofenac 1.3 % Ptch Commonly known as: FLECTOR Place 1 patch onto the skin 2 (two) times daily as needed (back and knee pain).   fenofibrate 145 MG tablet Commonly known as: TRICOR TAKE 1 TABLET (145 MG TOTAL) BY MOUTH DAILY.   FLONASE ALLERGY RELIEF NA Place 1 spray into the nose daily as needed (congestion).   FreeStyle Libre 2 Sensor Misc   furosemide 40 MG tablet Commonly known as: LASIX Take 40 mg by mouth 2 (two) times daily.   gabapentin 800 MG tablet Commonly known as: NEURONTIN Take 800 mg by mouth 3 (three) times daily.   glipiZIDE 10 MG tablet Commonly known as: GLUCOTROL Take 10 mg by mouth 2 (two) times daily.   HumuLIN R U-500 KwikPen 500 UNIT/ML KwikPen Generic drug: insulin regular human CONCENTRATED Inject 85-110 Units into the skin See admin instructions. Per sliding scale 3 times daily with meals   Insulin Pen Needle 31G X 5 MM Misc Use 1 needle daily to inject insulin as prescribed   isosorbide mononitrate 30 MG 24 hr tablet Commonly known as: IMDUR TAKE 1 TABLET BY MOUTH DAILY   lisinopril 20 MG tablet Commonly known as: ZESTRIL Take 20 mg by mouth daily.   nitroGLYCERIN 0.4 MG SL tablet Commonly known as: NITROSTAT Place 1 tablet (0.4 mg total) under the tongue every 5 (five) minutes as needed for chest pain.   ondansetron 4 MG tablet Commonly known as: ZOFRAN Take 4 mg by mouth 2 (two) times daily as needed for nausea/vomiting, vomiting or nausea.   Oxycodone HCl 20 MG Tabs Take 20 mg by mouth 4 (four) times daily as needed (pain).   pantoprazole 40 MG tablet Commonly known as: PROTONIX Take 1 tablet by  mouth daily.   potassium chloride 10 MEQ CR capsule Commonly known as: MICRO-K Take 2 capsules (20 mEq total) by mouth 2 (two) times daily.   sertraline 50 MG tablet Commonly known as: ZOLOFT Take 50 mg by mouth every morning.   tiZANidine 4 MG tablet Commonly known as: ZANAFLEX Take 4 mg by mouth at bedtime as needed for muscle spasms.   traZODone 50 MG tablet Commonly known as: DESYREL Take 1 tablet (50 mg total) by mouth at bedtime.   Vitamin D (Ergocalciferol) 1.25 MG (50000 UNIT) Caps capsule Commonly known as: DRISDOL Take 50,000 Units by mouth every Monday.        Allergies  Allergen Reactions   Coconut Flavor [Flavoring Agent] Hives   Ibuprofen Other (See Comments)    Made gastric ulcers worse   Aleve [Naproxen] Other (See Comments)    DUE TO KIDNEYS   Nsaids Other (See Comments)    Stomach ulcers    Other Hives  Nut Allergy   Vascepa [Icosapent Ethyl] Other (See Comments)    headaches, chest pain, similar to sx of a stroke, hypotension     Consultations: Neurology   Procedures/Studies: MR ANGIO HEAD WO CONTRAST  Result Date: 08/16/2022 CLINICAL DATA:  Cerebral aneurysm screening. Oculomotor palsy on the right over the last 2 days. EXAM: MRA HEAD WITHOUT CONTRAST TECHNIQUE: Angiographic images of the Circle of Willis were acquired using MRA technique without intravenous contrast. COMPARISON:  CT studies done yesterday. FINDINGS: Anterior circulation: Both internal carotid arteries are widely patent through the skull base and siphon regions. No aneurysm to explain cranial nerve palsy. The anterior and middle cerebral vessels are normal. Diminutive A1 segment on the left. Posterior circulation: Both vertebral arteries are widely patent to the basilar artery. There may be mild atherosclerotic irregularity of the right vertebral artery but there is no stenosis. No basilar stenosis. No posterior circulation aneurysm. Anatomic variants: None significant. Other:  None. IMPRESSION: Negative intracranial MR angiography of the large and medium size vessels. No aneurysm to explain cranial nerve palsy on the right. Electronically Signed   By: Nelson Chimes M.D.   On: 08/16/2022 07:35   CT ANGIO HEAD NECK W WO CM  Result Date: 08/15/2022 CLINICAL DATA:  Headache and diplopia EXAM: CT ANGIOGRAPHY HEAD AND NECK TECHNIQUE: Multidetector CT imaging of the head and neck was performed using the standard protocol during bolus administration of intravenous contrast. Multiplanar CT image reconstructions and MIPs were obtained to evaluate the vascular anatomy. Carotid stenosis measurements (when applicable) are obtained utilizing NASCET criteria, using the distal internal carotid diameter as the denominator. RADIATION DOSE REDUCTION: This exam was performed according to the departmental dose-optimization program which includes automated exposure control, adjustment of the mA and/or kV according to patient size and/or use of iterative reconstruction technique. CONTRAST:  75m OMNIPAQUE IOHEXOL 350 MG/ML SOLN COMPARISON:  None Available. FINDINGS: CT HEAD FINDINGS Brain: There is no mass, hemorrhage or extra-axial collection. The size and configuration of the ventricles and extra-axial CSF spaces are normal. There is no acute or chronic infarction. The brain parenchyma is normal. Skull: The visualized skull base, calvarium and extracranial soft tissues are normal. Sinuses/Orbits: No fluid levels or advanced mucosal thickening of the visualized paranasal sinuses. No mastoid or middle ear effusion. The orbits are normal. CTA NECK FINDINGS SKELETON: There is no bony spinal canal stenosis. No lytic or blastic lesion. OTHER NECK: Normal pharynx, larynx and major salivary glands. No cervical lymphadenopathy. Unremarkable thyroid gland. UPPER CHEST: No pneumothorax or pleural effusion. No nodules or masses. AORTIC ARCH: There is no calcific atherosclerosis of the aortic arch. There is no  aneurysm, dissection or hemodynamically significant stenosis of the visualized portion of the aorta. Conventional 3 vessel aortic branching pattern. The visualized proximal subclavian arteries are widely patent. RIGHT CAROTID SYSTEM: No dissection, occlusion or aneurysm. Mild atherosclerotic calcification at the carotid bifurcation without hemodynamically significant stenosis. LEFT CAROTID SYSTEM: Normal without aneurysm, dissection or stenosis. VERTEBRAL ARTERIES: Left dominant configuration. Both origins are clearly patent. There is no dissection, occlusion or flow-limiting stenosis to the skull base (V1-V3 segments). CTA HEAD FINDINGS Poor contrast bolus. POSTERIOR CIRCULATION: --Vertebral arteries: Normal V4 segments. --Inferior cerebellar arteries: Normal. --Basilar artery: Normal. --Superior cerebellar arteries: Normal. --Posterior cerebral arteries (PCA): Normal. ANTERIOR CIRCULATION: --Intracranial internal carotid arteries: Normal. --Anterior cerebral arteries (ACA): Normal. Both A1 segments are present. Patent anterior communicating artery (a-comm). --Middle cerebral arteries (MCA): Normal. VENOUS SINUSES: As permitted by contrast timing, patent. ANATOMIC VARIANTS: None  Review of the MIP images confirms the above findings. IMPRESSION: 1. Poor contrast bolus. No emergent large vessel occlusion or high-grade stenosis of the intracranial arteries. 2. Normal CTA of the neck. Electronically Signed   By: Ulyses Jarred M.D.   On: 08/15/2022 22:05   MR BRAIN WO CONTRAST  Result Date: 08/15/2022 CLINICAL DATA:  Right-sided headache with blurry vision/diplopia in the right eye. Ptosis of the right eye. EXAM: MRI HEAD WITHOUT CONTRAST TECHNIQUE: Multiplanar, multiecho pulse sequences of the brain and surrounding structures were obtained without intravenous contrast. COMPARISON:  Same-day CT head FINDINGS: Brain: There is no acute intracranial hemorrhage, extra-axial fluid collection, or acute infarct.  Parenchymal volume is normal. The ventricles are normal in size. Gray-white differentiation is preserved. There are a few tiny foci of FLAIR signal abnormality in the supratentorial white matter, nonspecific but may reflect early/minimal chronic small vessel ischemic change There is no suspicious parenchymal signal abnormality. There is no mass lesion. There is no mass effect or midline shift. Vascular: Normal flow voids. Skull and upper cervical spine: Normal marrow signal. Sinuses/Orbits: The paranasal sinuses are clear. The globes and orbits are unremarkable. The optic nerves are grossly unremarkable on these nondedicated sequences. Other: None. IMPRESSION: Essentially normal for age brain MRI with no acute intracranial pathology to explain the patient's symptoms Electronically Signed   By: Valetta Mole M.D.   On: 08/15/2022 20:26   CT Head Wo Contrast  Result Date: 08/15/2022 CLINICAL DATA:  Headache with dizziness and photophobia EXAM: CT HEAD WITHOUT CONTRAST TECHNIQUE: Contiguous axial images were obtained from the base of the skull through the vertex without intravenous contrast. RADIATION DOSE REDUCTION: This exam was performed according to the departmental dose-optimization program which includes automated exposure control, adjustment of the mA and/or kV according to patient size and/or use of iterative reconstruction technique. COMPARISON:  03/22/22 FINDINGS: Brain: No evidence of acute infarction, hemorrhage, hydrocephalus, extra-axial collection or mass lesion/mass effect. Vascular: No hyperdense vessel or unexpected calcification. Skull: Normal. Negative for fracture or focal lesion. Sinuses/Orbits: No acute finding. Other: None IMPRESSION: No CT finding to explain headache, dizziness, or photophobia. Electronically Signed   By: Marin Roberts M.D.   On: 08/15/2022 13:58   DG Chest 2 View  Result Date: 08/15/2022 CLINICAL DATA:  Chest pain. EXAM: CHEST - 2 VIEW COMPARISON:  May 01, 2022.  FINDINGS: The heart size and mediastinal contours are within normal limits. Both lungs are clear. The visualized skeletal structures are unremarkable. IMPRESSION: No active cardiopulmonary disease. Electronically Signed   By: Marijo Conception M.D.   On: 08/15/2022 13:19   (Echo, Carotid, EGD, Colonoscopy, ERCP)    Subjective: Patient was seen and examined.  No overnight events.  He had episode of nausea after MRI with contrast and improved now.  Feels better when right eye is patched.   Discharge Exam: Vitals:   08/17/22 0152 08/17/22 0600  BP: 134/68 (!) 145/69  Pulse: (!) 57 (!) 59  Resp: 20 20  Temp: (!) 97.4 F (36.3 C) 97.6 F (36.4 C)  SpO2: 96% 95%   Vitals:   08/16/22 1757 08/16/22 2156 08/17/22 0152 08/17/22 0600  BP: (!) 164/74 118/79 134/68 (!) 145/69  Pulse: 62 65 (!) 57 (!) 59  Resp: '18 20 20 20  '$ Temp: 97.7 F (36.5 C) (!) 97.5 F (36.4 C) (!) 97.4 F (36.3 C) 97.6 F (36.4 C)  TempSrc: Oral Oral Oral Oral  SpO2: 97% 98% 96% 95%  Weight:  Height:        General: Pt is alert, awake, not in acute distress Walking around in the hallway. Cardiovascular: RRR, S1/S2 +, no rubs, no gallops Respiratory: CTA bilaterally, no wheezing, no rhonchi Abdominal: Soft, NT, ND, bowel sounds + Extremities: no edema, no cyanosis    The results of significant diagnostics from this hospitalization (including imaging, microbiology, ancillary and laboratory) are listed below for reference.     Microbiology: No results found for this or any previous visit (from the past 240 hour(s)).   Labs: BNP (last 3 results) Recent Labs    10/16/21 2109 10/24/21 0941 08/15/22 1400  BNP 30.1 90.2 8.3   Basic Metabolic Panel: Recent Labs  Lab 08/15/22 2046 08/17/22 0501  NA 136 135  K 4.7 4.3  CL 101 99  CO2 24 27  GLUCOSE 239* 213*  BUN 48* 31*  CREATININE 2.29* 1.27*  CALCIUM 9.2 9.6   Liver Function Tests: Recent Labs  Lab 08/17/22 0501  AST 22  ALT 20   ALKPHOS 37*  BILITOT 0.8  PROT 7.7  ALBUMIN 3.9   No results for input(s): "LIPASE", "AMYLASE" in the last 168 hours. No results for input(s): "AMMONIA" in the last 168 hours. CBC: Recent Labs  Lab 08/15/22 1400 08/17/22 0501  WBC 8.6 8.6  HGB 12.3* 12.2*  HCT 38.6* 38.6*  MCV 92.1 94.1  PLT 218 214   Cardiac Enzymes: No results for input(s): "CKTOTAL", "CKMB", "CKMBINDEX", "TROPONINI" in the last 168 hours. BNP: Invalid input(s): "POCBNP" CBG: Recent Labs  Lab 08/16/22 0804 08/16/22 1215 08/16/22 1801 08/17/22 0811  GLUCAP 249* 322* 234* 210*   D-Dimer No results for input(s): "DDIMER" in the last 72 hours. Hgb A1c Recent Labs    08/17/22 0501  HGBA1C 9.2*   Lipid Profile No results for input(s): "CHOL", "HDL", "LDLCALC", "TRIG", "CHOLHDL", "LDLDIRECT" in the last 72 hours. Thyroid function studies No results for input(s): "TSH", "T4TOTAL", "T3FREE", "THYROIDAB" in the last 72 hours.  Invalid input(s): "FREET3" Anemia work up No results for input(s): "VITAMINB12", "FOLATE", "FERRITIN", "TIBC", "IRON", "RETICCTPCT" in the last 72 hours. Urinalysis    Component Value Date/Time   COLORURINE YELLOW 03/22/2022 1646   APPEARANCEUR HAZY (A) 03/22/2022 1646   LABSPEC 1.013 03/22/2022 1646   PHURINE 5.0 03/22/2022 1646   GLUCOSEU >=500 (A) 03/22/2022 1646   HGBUR NEGATIVE 03/22/2022 1646   BILIRUBINUR NEGATIVE 03/22/2022 1646   KETONESUR NEGATIVE 03/22/2022 1646   PROTEINUR 30 (A) 03/22/2022 1646   UROBILINOGEN 0.2 05/27/2014 2312   NITRITE NEGATIVE 03/22/2022 1646   LEUKOCYTESUR NEGATIVE 03/22/2022 1646   Sepsis Labs Recent Labs  Lab 08/15/22 1400 08/17/22 0501  WBC 8.6 8.6   Microbiology No results found for this or any previous visit (from the past 240 hour(s)).   Time coordinating discharge:  35 minutes  SIGNED:   Barb Merino, MD  [facility] 08/17/2022, 9:40 AM

## 2022-09-05 ENCOUNTER — Encounter: Payer: Self-pay | Admitting: Podiatry

## 2022-09-05 ENCOUNTER — Ambulatory Visit (INDEPENDENT_AMBULATORY_CARE_PROVIDER_SITE_OTHER): Payer: Medicaid Other | Admitting: Podiatry

## 2022-09-05 DIAGNOSIS — N179 Acute kidney failure, unspecified: Secondary | ICD-10-CM

## 2022-09-05 DIAGNOSIS — M79675 Pain in left toe(s): Secondary | ICD-10-CM | POA: Diagnosis not present

## 2022-09-05 DIAGNOSIS — M79674 Pain in right toe(s): Secondary | ICD-10-CM

## 2022-09-05 DIAGNOSIS — E1142 Type 2 diabetes mellitus with diabetic polyneuropathy: Secondary | ICD-10-CM

## 2022-09-05 DIAGNOSIS — B351 Tinea unguium: Secondary | ICD-10-CM | POA: Diagnosis not present

## 2022-09-05 NOTE — Progress Notes (Signed)
This patient presents  to my office for at risk foot care.  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 legs.This patient is unable to cut nails himself since the patient cannot reach his nails.These nails are painful walking and wearing shoes.  This patient presents for at risk foot care today.  General Appearance  Alert, conversant and in no acute stress.  Vascular  Dorsalis pedis   pulses are absent bilaterally. Posterior tibial pulses are absent due to swelling. Capillary return is within normal limits  bilaterally. Temperature is within normal limits  bilaterally.  Venous stasis  B/L.  Neurologic  Senn-Weinstein monofilament wire test within normal limits  bilaterally. Muscle power within normal limits bilaterally.  Nails Thick disfigured discolored nails with subungual debris  from hallux to fifth toes bilaterally. No evidence of bacterial infection or drainage bilaterally.  Orthopedic  No limitations of motion  feet .  No crepitus or effusions noted.  No bony pathology or digital deformities noted.  Skin  dry skin with no porokeratosis noted bilaterally.  No signs of infections or ulcers noted.     Onychomycosis  Pain in right toes  Pain in left toes  Consent was obtained for treatment procedures.   Mechanical debridement of nails 1-5  bilaterally performed with a nail nipper.  Filed with dremel without incident.    Return office visit    3 months                 Told patient to return for periodic foot care and evaluation due to potential at risk complications.   Gardiner Barefoot DPM

## 2022-10-02 ENCOUNTER — Other Ambulatory Visit: Payer: Self-pay | Admitting: Oncology

## 2022-10-02 DIAGNOSIS — I2699 Other pulmonary embolism without acute cor pulmonale: Secondary | ICD-10-CM

## 2022-10-11 ENCOUNTER — Ambulatory Visit: Payer: Medicaid Other | Admitting: Neurology

## 2022-10-11 ENCOUNTER — Encounter: Payer: Self-pay | Admitting: Neurology

## 2022-10-11 VITALS — BP 146/69 | HR 67 | Ht 70.0 in | Wt 346.0 lb

## 2022-10-11 DIAGNOSIS — G510 Bell's palsy: Secondary | ICD-10-CM | POA: Diagnosis not present

## 2022-10-11 DIAGNOSIS — E1142 Type 2 diabetes mellitus with diabetic polyneuropathy: Secondary | ICD-10-CM

## 2022-10-11 NOTE — Progress Notes (Signed)
Chief Complaint  Patient presents with   New Patient (Initial Visit)    NX Dr. Orson Aloe 2020/internal referral for diplopia States sx occurred in October 2023, states sx have stopped       ASSESSMENT AND PLAN  [name] is a 61 y.o. male   Transient diplopia,   MRI brain showed no significant abnormalities.  CTA of head and neck were normal.   Poorly controlled DM, A1C 9.2,   Continue current medications, on anticoagulation due to history of PE, continue Eliqus '5mg'$  bid.  DIAGNOSTIC DATA (LABS, IMAGING, TESTING) - I reviewed patient records, labs, notes, testing and imaging myself where available.   MEDICAL HISTORY:  [name] is a 61 year old male, seen in request by Dr.   Barb Merino for hospital discharge for complaints of diplopia  I reviewed and summarized the referring note.  Past medical history Coronary artery disease Chronic pain, Depression Pulmonary emboli on anticoagulation clinic Uncontrolled type 2 diabetes Obesity Obstructive sleep apnea History of right Bell's palsy.  He was admitted to the hospital on October 23-25 2023 for complaints of diplopia for more than one week.  He has extensive evaluations, personally reviewed MRI brain Aug 15 2022, was essentially normal.  CT angiogram of head and neck showed no large vessel disease.  ECHO in May 2023, showed normal ejection fraction, no significant abnormality otherwise  Lab in Oct 2023: A1C 9.2, CMP, glucose 213, creat 1.27, CBC Hg 12.2    PHYSICAL EXAM:   Vitals:   10/11/22 1104  BP: (!) 146/69  Pulse: 67  Weight: (!) 346 lb (156.9 kg)  Height: '5\' 10"'$  (1.778 m)   Not recorded     Body mass index is 49.65 kg/m.  PHYSICAL EXAMNIATION:  Gen: NAD, conversant, well nourised, well groomed                     Cardiovascular: Regular rate rhythm, no peripheral edema, warm, nontender. Eyes: Conjunctivae clear without exudates or hemorrhage Neck: Supple, no carotid  bruits. Pulmonary: Clear to auscultation bilaterally   NEUROLOGICAL EXAM:  MENTAL STATUS: Speech/cognition: Awake, alert, oriented to history taking and casual conversation CRANIAL NERVES: CN II: Visual fields are full to confrontation. Pupils are round equal and briskly reactive to light. CN III, IV, VI: extraocular movement are normal. No ptosis. CN V: Facial sensation is intact to light touch CN VII: mild facial asymmetry, mild right upper and lower face weakness. CN VIII: Hearing is normal to causal conversation. CN IX, X: Phonation is normal. CN XI: Head turning and shoulder shrug are intact  MOTOR: There is no pronator drift of out-stretched arms. Muscle bulk and tone are normal. Muscle strength is normal.  REFLEXES: Reflexes are 1 and symmetric at the biceps, triceps, knees, and ankles. Plantar responses are flexor.  SENSORY: Intact to light touch, pinprick and vibratory sensation are intact in fingers and toes.  COORDINATION: There is no trunk or limb dysmetria noted.  GAIT/STANCE: Posture is normal. Gait is steady    REVIEW OF SYSTEMS:  Full 14 system review of systems performed and notable only for as above All other review of systems were negative.   ALLERGIES: Allergies  Allergen Reactions   Coconut Flavor [Flavoring Agent] Hives   Ibuprofen Other (See Comments)    Made gastric ulcers worse   Aleve [Naproxen] Other (See Comments)    DUE TO KIDNEYS   Nsaids Other (See Comments)    Stomach ulcers    Other  Hives    Nut Allergy   Vascepa [Icosapent Ethyl] Other (See Comments)    headaches, chest pain, similar to sx of a stroke, hypotension     HOME MEDICATIONS: Current Outpatient Medications  Medication Sig Dispense Refill   ACCU-CHEK GUIDE test strip      albuterol (VENTOLIN HFA) 108 (90 Base) MCG/ACT inhaler Inhale 2 puffs into the lungs every 6 (six) hours as needed for wheezing or shortness of breath.     atorvastatin (LIPITOR) 40 MG tablet Take  40 mg by mouth at bedtime.     carvedilol (COREG) 6.25 MG tablet Take 1 tablet (6.25 mg total) by mouth 2 (two) times daily with a meal. 60 tablet 2   Continuous Blood Gluc Sensor (FREESTYLE LIBRE 2 SENSOR) MISC      cyanocobalamin 1000 MCG tablet Take 1,000 mcg by mouth daily.     diclofenac (FLECTOR) 1.3 % PTCH Place 1 patch onto the skin 2 (two) times daily as needed (back and knee pain).     ELIQUIS 5 MG TABS tablet TAKE 1 TABLET BY MOUTH TWICE A DAY 60 tablet 2   fenofibrate (TRICOR) 145 MG tablet TAKE 1 TABLET (145 MG TOTAL) BY MOUTH DAILY. 90 tablet 3   Fluticasone Propionate (FLONASE ALLERGY RELIEF NA) Place 1 spray into the nose daily as needed (congestion).     furosemide (LASIX) 40 MG tablet Take 40 mg by mouth 2 (two) times daily.     gabapentin (NEURONTIN) 800 MG tablet Take 800 mg by mouth 3 (three) times daily.     glipiZIDE (GLUCOTROL) 10 MG tablet Take 10 mg by mouth 2 (two) times daily.  4   HUMULIN R U-500 KWIKPEN 500 UNIT/ML kwikpen Inject 85-110 Units into the skin See admin instructions. Per sliding scale 3 times daily with meals     Insulin Pen Needle 31G X 5 MM MISC Use 1 needle daily to inject insulin as prescribed 100 each 2   isosorbide mononitrate (IMDUR) 30 MG 24 hr tablet TAKE 1 TABLET BY MOUTH DAILY (Patient taking differently: Take 30 mg by mouth daily.) 28 tablet 12   lisinopril (ZESTRIL) 20 MG tablet Take 20 mg by mouth daily.     Menthol, Topical Analgesic, (BIOFREEZE EX) Apply 1 application topically 2 (two) times daily as needed (knee pain).     nitroGLYCERIN (NITROSTAT) 0.4 MG SL tablet Place 1 tablet (0.4 mg total) under the tongue every 5 (five) minutes as needed for chest pain. 100 tablet 1   ondansetron (ZOFRAN) 4 MG tablet Take 4 mg by mouth 2 (two) times daily as needed for nausea/vomiting, vomiting or nausea.     Oxycodone HCl 20 MG TABS Take 20 mg by mouth 4 (four) times daily as needed (pain).     pantoprazole (PROTONIX) 40 MG tablet Take 1 tablet by  mouth daily.     potassium chloride (MICRO-K) 10 MEQ CR capsule Take 2 capsules (20 mEq total) by mouth 2 (two) times daily. 120 capsule 3   sertraline (ZOLOFT) 50 MG tablet Take 50 mg by mouth every morning.     tiZANidine (ZANAFLEX) 4 MG tablet Take 4 mg by mouth at bedtime as needed for muscle spasms.     traZODone (DESYREL) 50 MG tablet Take 1 tablet (50 mg total) by mouth at bedtime. 30 tablet 11   Vitamin D, Ergocalciferol, (DRISDOL) 1.25 MG (50000 UT) CAPS capsule Take 50,000 Units by mouth every Monday.     No current facility-administered medications for  this visit.    PAST MEDICAL HISTORY: Past Medical History:  Diagnosis Date   Anemia    Arthritis    Back pain    CAD (coronary artery disease)    a. s/p DES to LAD in 05/2016   Cervical radiculopathy    Chronic diastolic CHF (congestive heart failure) (HCC)    Chronic pain    Depression    DVT (deep venous thrombosis) (HCC)    Hematemesis    Hepatic steatosis    Hyperlipidemia    Hypertension    IBS (irritable bowel syndrome)    Morbid obesity (HCC)    OSA (obstructive sleep apnea)    Pancreatitis    PE (pulmonary thromboembolism) ([place])    Peripheral neuropathy    PUD (peptic ulcer disease)    Renal disorder    Stroke [facility])    a. ?details unclear - not seen on imaging when he was admitted in 05/2017 for TIA symptoms which were felt due to cervical radiculopathy.   Thoracic aortic ectasia (HCC)    a. 4.3cm ectatic ascending thoracic aorta by CT 06/2017.    Type 2 diabetes mellitus ([place])     PAST SURGICAL HISTORY: Past Surgical History:  Procedure Laterality Date   CARDIAC CATHETERIZATION N/A 05/31/2016   Procedure: Left Heart Cath and Coronary Angiography;  Surgeon: Peter M Martinique, MD;  Location: [place] [facility];  Service: Cardiovascular;  Laterality: N/A;   CARDIAC CATHETERIZATION N/A 05/31/2016   Procedure: Intravascular Pressure Wire/FFR Study;  Surgeon: Peter M Martinique, MD;  Location: [place] [facility];   Service: Cardiovascular;  Laterality: N/A;   CARDIAC CATHETERIZATION N/A 05/31/2016   Procedure: Coronary Stent Intervention;  Surgeon: Peter M Martinique, MD;  Location: [place] [facility];  Service: Cardiovascular;  Laterality: N/A;   COLONOSCOPY N/A 07/17/2021   Procedure: COLONOSCOPY;  Surgeon: Wilford Corner, MD;  Location: [facility];  Service: Endoscopy;  Laterality: N/A;   COLONOSCOPY WITH PROPOFOL N/A 04/29/2020   Procedure: COLONOSCOPY WITH PROPOFOL;  Surgeon: Wilford Corner, MD;  Location: [place];  Service: Endoscopy;  Laterality: N/A;   ESOPHAGOGASTRODUODENOSCOPY N/A 04/29/2020   Procedure: ESOPHAGOGASTRODUODENOSCOPY (EGD);  Surgeon: Wilford Corner, MD;  Location: [place];  Service: Endoscopy;  Laterality: N/A;   ESOPHAGOGASTRODUODENOSCOPY (EGD) WITH PROPOFOL N/A 07/17/2021   Procedure: ESOPHAGOGASTRODUODENOSCOPY (EGD) WITH PROPOFOL;  Surgeon: Wilford Corner, MD;  Location: [facility];  Service: Endoscopy;  Laterality: N/A;   LEFT HEART CATH AND CORONARY ANGIOGRAPHY N/A 12/08/2017   Procedure: LEFT HEART CATH AND CORONARY ANGIOGRAPHY;  Surgeon: Leonie Man, MD;  Location: [place] [facility];  Service: Cardiovascular;  Laterality: N/A;   LEFT HEART CATHETERIZATION WITH CORONARY ANGIOGRAM N/A 02/03/2014   Procedure: LEFT HEART CATHETERIZATION WITH CORONARY ANGIOGRAM;  Surgeon: Pixie Casino, MD;  Location: [facility];  Service: Cardiovascular;  Laterality: N/A;   left leg stent      POLYPECTOMY  04/29/2020   Procedure: POLYPECTOMY;  Surgeon: Wilford Corner, MD;  Location: [facility] ENDOSCOPY;  Service: Endoscopy;;   POLYPECTOMY  07/17/2021   Procedure: POLYPECTOMY;  Surgeon: Wilford Corner, MD;  Location: [facility];  Service: Endoscopy;;    FAMILY HISTORY: Family History  Problem Relation Age of Onset   Cancer Father    Hypertension Mother    Diabetes Mother    Breast cancer Mother    Hypertension Brother    Diabetes Brother    Hypertension Sister     Diabetes Sister     SOCIAL HISTORY: Social History  Socioeconomic History   Marital status: Single    Spouse name: Not on file   Number of children: 3   Years of education: 62   Highest education level: Not on file  Occupational History   Occupation: Disabled  Tobacco Use   Smoking status: Never    Passive exposure: Past   Smokeless tobacco: Never  Vaping Use   Vaping Use: Never used  Substance and Sexual Activity   Alcohol use: No   Drug use: No   Sexual activity: Not on file  Other Topics Concern   Not on file  Social History Narrative   Independent and ambulatory with cane.   Lives at home alone.   Right-handed.   1-2 cups caffeine per day.   Social Determinants of Health   Financial Resource Strain: Not on file  Food Insecurity: Not on file  Transportation Needs: Not on file  Physical Activity: Not on file  Stress: Not on file  Social Connections: Not on file  Intimate Partner Violence: Not on file      Marcial Pacas, M.D. Ph.D.  [facility] [address], [place], [place] [address] Ph: [phone] Fax: [phone]  CC:  Barb Merino, [place],  [place] [phone]  Drue Flirt, MD

## 2022-11-09 ENCOUNTER — Inpatient Hospital Stay (HOSPITAL_COMMUNITY)
Admission: EM | Admit: 2022-11-09 | Discharge: 2022-11-16 | DRG: 394 | Disposition: A | Discharge status: Home / Self Care | Payer: Medicaid Other | Attending: Internal Medicine | Admitting: Internal Medicine

## 2022-11-09 ENCOUNTER — Encounter (HOSPITAL_COMMUNITY): Payer: Self-pay | Admitting: Oncology

## 2022-11-09 ENCOUNTER — Other Ambulatory Visit: Payer: Self-pay

## 2022-11-09 DIAGNOSIS — G894 Chronic pain syndrome: Secondary | ICD-10-CM | POA: Diagnosis present

## 2022-11-09 DIAGNOSIS — Z888 Allergy status to other drugs, medicaments and biological substances status: Secondary | ICD-10-CM

## 2022-11-09 DIAGNOSIS — M5412 Radiculopathy, cervical region: Secondary | ICD-10-CM | POA: Diagnosis present

## 2022-11-09 DIAGNOSIS — Z803 Family history of malignant neoplasm of breast: Secondary | ICD-10-CM

## 2022-11-09 DIAGNOSIS — Z79899 Other long term (current) drug therapy: Secondary | ICD-10-CM

## 2022-11-09 DIAGNOSIS — E1165 Type 2 diabetes mellitus with hyperglycemia: Secondary | ICD-10-CM | POA: Diagnosis present

## 2022-11-09 DIAGNOSIS — I77810 Thoracic aortic ectasia: Secondary | ICD-10-CM | POA: Diagnosis present

## 2022-11-09 DIAGNOSIS — N189 Chronic kidney disease, unspecified: Secondary | ICD-10-CM | POA: Diagnosis present

## 2022-11-09 DIAGNOSIS — Z6841 Body Mass Index (BMI) 40.0 and over, adult: Secondary | ICD-10-CM

## 2022-11-09 DIAGNOSIS — E875 Hyperkalemia: Secondary | ICD-10-CM | POA: Diagnosis present

## 2022-11-09 DIAGNOSIS — K635 Polyp of colon: Secondary | ICD-10-CM | POA: Diagnosis present

## 2022-11-09 DIAGNOSIS — G4733 Obstructive sleep apnea (adult) (pediatric): Secondary | ICD-10-CM | POA: Diagnosis present

## 2022-11-09 DIAGNOSIS — E1142 Type 2 diabetes mellitus with diabetic polyneuropathy: Secondary | ICD-10-CM | POA: Diagnosis present

## 2022-11-09 DIAGNOSIS — I5032 Chronic diastolic (congestive) heart failure: Secondary | ICD-10-CM | POA: Diagnosis present

## 2022-11-09 DIAGNOSIS — K648 Other hemorrhoids: Principal | ICD-10-CM | POA: Diagnosis present

## 2022-11-09 DIAGNOSIS — E785 Hyperlipidemia, unspecified: Secondary | ICD-10-CM | POA: Diagnosis present

## 2022-11-09 DIAGNOSIS — D539 Nutritional anemia, unspecified: Secondary | ICD-10-CM | POA: Diagnosis present

## 2022-11-09 DIAGNOSIS — I130 Hypertensive heart and chronic kidney disease with heart failure and stage 1 through stage 4 chronic kidney disease, or unspecified chronic kidney disease: Secondary | ICD-10-CM | POA: Diagnosis present

## 2022-11-09 DIAGNOSIS — Z86010 Personal history of colonic polyps: Secondary | ICD-10-CM

## 2022-11-09 DIAGNOSIS — G8929 Other chronic pain: Secondary | ICD-10-CM | POA: Diagnosis present

## 2022-11-09 DIAGNOSIS — E66813 Obesity, class 3: Secondary | ICD-10-CM | POA: Diagnosis present

## 2022-11-09 DIAGNOSIS — N179 Acute kidney failure, unspecified: Secondary | ICD-10-CM | POA: Diagnosis present

## 2022-11-09 DIAGNOSIS — R0789 Other chest pain: Secondary | ICD-10-CM | POA: Diagnosis present

## 2022-11-09 DIAGNOSIS — Z91199 Patient's noncompliance with other medical treatment and regimen due to unspecified reason: Secondary | ICD-10-CM

## 2022-11-09 DIAGNOSIS — Z7984 Long term (current) use of oral hypoglycemic drugs: Secondary | ICD-10-CM

## 2022-11-09 DIAGNOSIS — F32A Depression, unspecified: Secondary | ICD-10-CM | POA: Diagnosis present

## 2022-11-09 DIAGNOSIS — Z86718 Personal history of other venous thrombosis and embolism: Secondary | ICD-10-CM

## 2022-11-09 DIAGNOSIS — E1140 Type 2 diabetes mellitus with diabetic neuropathy, unspecified: Secondary | ICD-10-CM | POA: Diagnosis present

## 2022-11-09 DIAGNOSIS — Z833 Family history of diabetes mellitus: Secondary | ICD-10-CM

## 2022-11-09 DIAGNOSIS — Z7901 Long term (current) use of anticoagulants: Secondary | ICD-10-CM

## 2022-11-09 DIAGNOSIS — I160 Hypertensive urgency: Secondary | ICD-10-CM | POA: Diagnosis present

## 2022-11-09 DIAGNOSIS — Z8711 Personal history of peptic ulcer disease: Secondary | ICD-10-CM

## 2022-11-09 DIAGNOSIS — Z794 Long term (current) use of insulin: Secondary | ICD-10-CM

## 2022-11-09 DIAGNOSIS — Z955 Presence of coronary angioplasty implant and graft: Secondary | ICD-10-CM

## 2022-11-09 DIAGNOSIS — R791 Abnormal coagulation profile: Secondary | ICD-10-CM | POA: Diagnosis present

## 2022-11-09 DIAGNOSIS — E538 Deficiency of other specified B group vitamins: Secondary | ICD-10-CM | POA: Diagnosis present

## 2022-11-09 DIAGNOSIS — Z8673 Personal history of transient ischemic attack (TIA), and cerebral infarction without residual deficits: Secondary | ICD-10-CM

## 2022-11-09 DIAGNOSIS — I2510 Atherosclerotic heart disease of native coronary artery without angina pectoris: Secondary | ICD-10-CM | POA: Diagnosis present

## 2022-11-09 DIAGNOSIS — E639 Nutritional deficiency, unspecified: Secondary | ICD-10-CM | POA: Diagnosis present

## 2022-11-09 DIAGNOSIS — I959 Hypotension, unspecified: Secondary | ICD-10-CM | POA: Diagnosis present

## 2022-11-09 DIAGNOSIS — E119 Type 2 diabetes mellitus without complications: Secondary | ICD-10-CM

## 2022-11-09 DIAGNOSIS — I10 Essential (primary) hypertension: Secondary | ICD-10-CM | POA: Diagnosis present

## 2022-11-09 DIAGNOSIS — Z91018 Allergy to other foods: Secondary | ICD-10-CM

## 2022-11-09 DIAGNOSIS — Z8249 Family history of ischemic heart disease and other diseases of the circulatory system: Secondary | ICD-10-CM

## 2022-11-09 DIAGNOSIS — Z86711 Personal history of pulmonary embolism: Secondary | ICD-10-CM | POA: Diagnosis present

## 2022-11-09 DIAGNOSIS — E1122 Type 2 diabetes mellitus with diabetic chronic kidney disease: Secondary | ICD-10-CM | POA: Diagnosis present

## 2022-11-09 DIAGNOSIS — K922 Gastrointestinal hemorrhage, unspecified: Principal | ICD-10-CM | POA: Diagnosis present

## 2022-11-09 DIAGNOSIS — E872 Acidosis, unspecified: Secondary | ICD-10-CM | POA: Diagnosis not present

## 2022-11-09 LAB — CBC
HCT: 34 % — ABNORMAL LOW (ref 39.0–52.0)
Hemoglobin: 11 g/dL — ABNORMAL LOW (ref 13.0–17.0)
MCH: 31.3 pg (ref 26.0–34.0)
MCHC: 32.4 g/dL (ref 30.0–36.0)
MCV: 96.6 fL (ref 80.0–100.0)
Platelets: 238 10*3/uL (ref 150–400)
RBC: 3.52 MIL/uL — ABNORMAL LOW (ref 4.22–5.81)
RDW: 14.6 % (ref 11.5–15.5)
WBC: 11.1 10*3/uL — ABNORMAL HIGH (ref 4.0–10.5)
nRBC: 0 % (ref 0.0–0.2)

## 2022-11-09 LAB — PROTIME-INR
INR: 1.2 (ref 0.8–1.2)
Prothrombin Time: 15.5 seconds — ABNORMAL HIGH (ref 11.4–15.2)

## 2022-11-09 LAB — POC OCCULT BLOOD, ED: Fecal Occult Bld: POSITIVE — AB

## 2022-11-09 MED ORDER — SODIUM CHLORIDE 0.9 % IV BOLUS
1000.0000 mL | Freq: Once | INTRAVENOUS | Status: AC
  Administered 2022-11-09: 1000 mL via INTRAVENOUS

## 2022-11-09 MED ORDER — SODIUM CHLORIDE 0.9% IV SOLUTION: Freq: Once | INTRAVENOUS | Status: DC

## 2022-11-09 MED ORDER — PANTOPRAZOLE SODIUM 40 MG IV SOLR
40.0000 mg | Freq: Once | INTRAVENOUS | Status: AC
  Administered 2022-11-09: 40 mg via INTRAVENOUS
  Filled 2022-11-09: qty 10

## 2022-11-09 NOTE — ED Provider Triage Note (Signed)
Emergency Medicine Provider Triage Evaluation Note  [name] , a 62 y.o. male  was evaluated in triage.  Pt complains of bright red blood per rectum.  Patient reports that he has had episodes of bright red blood during bowel movements as well as dark-colored stools.  Patient reports a prior history of a gastric ulcer and for that reason he does not take any aspirin or any anti-inflammatory medication.  Patient reports that he does not have any pain with these bowel movements at this time.  No fevers, chest pain, shortness of breath.  Currently on Eliquis for prior history of pulmonary embolism.  Review of Systems  Positive: As above Negative: As above  Physical Exam  BP (!) 89/45 (BP Location: Left Arm)   Pulse (!) 50   Temp (!) 97.3 F (36.3 C) (Oral)   Resp 18   Ht '5\' 10"'$  (1.778 m)   Wt (!) 149.7 kg   SpO2 99%   BMI 47.35 kg/m  Gen:   Awake, no distress  Resp:  Normal effort  MSK:   Moves extremities without difficulty Other:  No focal or generalized abdominal tenderness  Medical Decision Making  Medically screening exam initiated at 5:56 PM.  Appropriate orders placed.  CIPRIANO MILLIKAN was informed that the remainder of the evaluation will be completed by another provider, this initial triage assessment does not replace that evaluation, and the importance of remaining in the ED until their evaluation is complete.     Luvenia Heller, PA-C 11/09/22 1758

## 2022-11-09 NOTE — ED Notes (Signed)
Will attempt [place] PIV

## 2022-11-09 NOTE — ED Triage Notes (Signed)
Pt bib [facility] from home d/t c/o black tarry stool as well as BRBPR. Pt also c/o b/l hand and feet numbness x 4 days, hx of neuropathy.

## 2022-11-09 NOTE — ED Notes (Signed)
  Attempted to collect labs unsuccessful

## 2022-11-09 NOTE — ED Notes (Signed)
Attempted IV.

## 2022-11-09 NOTE — ED Notes (Signed)
I made nurse aware of patient b/p

## 2022-11-09 NOTE — ED Notes (Signed)
Not able to get labwork.  Informed RN

## 2022-11-09 NOTE — ED Provider Notes (Signed)
[facility] DEPT Provider Note   CSN: [id number] Arrival date & time: 11/09/22  1402     History  Chief Complaint  Patient presents with   GI Problem    C/o dark tarry stool as well as BRBPR    [name] is a 62 y.o. male.  HPI   Patient with medical history including anemia, OA, CAD, diastolic heart failure, PE currently on Eliquis, diabetes, upper GI bleed presents with complaints of GI bleeds.  Patient states that over the last week he has been having bloody stools, states bright red and dark and tarry, he has no associated stomach pains no constipation, no nausea no vomiting, he denies any chest pain shortness of breath lightheaded dizziness near syncope.  He does note that he has been having some paresthesias just in his hands and feet bilaterally, does not move up his arms or legs, states that he has had an intermittent headache which comes and goes, as well as some intermittent change in vision, currently has no headache or change vision at this time denies  recent head trauma.  He states he does have a history of neuropathy is unclear if this is his neuropathy.  He denies ulcers alcohol use or NSAID use.  Patient states that he not taken any of his medications today last time he took his Eliquis was yesterday.  Reviewed patient's chart had recent colonoscopy as well as an endoscopy 1 year ago which reveals diverticulosis as well as internal hemorrhoids.  Home Medications Prior to Admission medications   Medication Sig Start Date End Date Taking? Authorizing Provider  ACCU-CHEK GUIDE test strip  05/19/22   [provider]  albuterol (VENTOLIN HFA) 108 (90 Base) MCG/ACT inhaler Inhale 2 puffs into the lungs every 6 (six) hours as needed for wheezing or shortness of breath.    [provider]  atorvastatin (LIPITOR) 40 MG tablet Take 40 mg by mouth at bedtime.    [provider]  carvedilol (COREG) 6.25 MG tablet Take 1  tablet (6.25 mg total) by mouth 2 (two) times daily with a meal. 03/26/22   Hosie Poisson, MD  Continuous Blood Gluc Sensor (FREESTYLE LIBRE 2 SENSOR) MISC  05/02/22   [provider]  cyanocobalamin 1000 MCG tablet Take 1,000 mcg by mouth daily. 03/06/20   [provider]  diclofenac (FLECTOR) 1.3 % PTCH Place 1 patch onto the skin 2 (two) times daily as needed (back and knee pain).    [provider]  ELIQUIS 5 MG TABS tablet TAKE 1 TABLET BY MOUTH TWICE A DAY 10/03/22   Wyatt Portela, MD  fenofibrate (TRICOR) 145 MG tablet TAKE 1 TABLET (145 MG TOTAL) BY MOUTH DAILY. 05/19/22   Deberah Pelton, NP  Fluticasone Propionate (FLONASE ALLERGY RELIEF NA) Place 1 spray into the nose daily as needed (congestion).    [provider]  furosemide (LASIX) 40 MG tablet Take 40 mg by mouth 2 (two) times daily.    [provider]  gabapentin (NEURONTIN) 800 MG tablet Take 800 mg by mouth 3 (three) times daily. 09/29/21   [provider]  glipiZIDE (GLUCOTROL) 10 MG tablet Take 10 mg by mouth 2 (two) times daily. 05/30/17   [provider]  HUMULIN R U-500 KWIKPEN 500 UNIT/ML kwikpen Inject 85-110 Units into the skin See admin instructions. Per sliding scale 3 times daily with meals 06/04/20   [provider]  Insulin Pen Needle 31G X 5 MM  MISC Use 1 needle daily to inject insulin as prescribed 06/18/17   Barton Dubois, MD  isosorbide mononitrate (IMDUR) 30 MG 24 hr tablet TAKE 1 TABLET BY MOUTH DAILY Patient taking differently: Take 30 mg by mouth daily. 01/03/22   Hilty, Nadean Corwin, MD  lisinopril (ZESTRIL) 20 MG tablet Take 20 mg by mouth daily.    [provider]  Menthol, Topical Analgesic, (BIOFREEZE EX) Apply 1 application topically 2 (two) times daily as needed (knee pain).    [provider]  nitroGLYCERIN (NITROSTAT) 0.4 MG SL tablet Place 1 tablet (0.4 mg total) under the tongue every 5 (five) minutes as needed for chest  pain. 02/06/19   Skeet Latch, MD  ondansetron (ZOFRAN) 4 MG tablet Take 4 mg by mouth 2 (two) times daily as needed for nausea/vomiting, vomiting or nausea. 04/21/20   [provider]  Oxycodone HCl 20 MG TABS Take 20 mg by mouth 4 (four) times daily as needed (pain).    [provider]  pantoprazole (PROTONIX) 40 MG tablet Take 1 tablet by mouth daily. 05/26/22   [provider]  potassium chloride (MICRO-K) 10 MEQ CR capsule Take 2 capsules (20 mEq total) by mouth 2 (two) times daily. 07/21/22   Lelon Perla, MD  sertraline (ZOLOFT) 50 MG tablet Take 50 mg by mouth every morning. 12/17/20   [provider]  tiZANidine (ZANAFLEX) 4 MG tablet Take 4 mg by mouth at bedtime as needed for muscle spasms. 03/31/20   [provider]  traZODone (DESYREL) 50 MG tablet Take 1 tablet (50 mg total) by mouth at bedtime. 12/15/16   Florinda Marker, MD  Vitamin D, Ergocalciferol, (DRISDOL) 1.25 MG (50000 UT) CAPS capsule Take 50,000 Units by mouth every Monday. 08/15/19   [provider]      Allergies    Coconut flavor [flavoring agent], Ibuprofen, Aleve [naproxen], Nsaids, Other, and Vascepa [icosapent ethyl]    Review of Systems   Review of Systems  Constitutional:  Negative for chills and fever.  Respiratory:  Negative for shortness of breath.   Cardiovascular:  Negative for chest pain.  Gastrointestinal:  Positive for anal bleeding and blood in stool. Negative for abdominal pain.  Neurological:  Negative for headaches.    Physical Exam Updated Vital Signs BP (!) 92/56   Pulse 68   Temp 98.3 F (36.8 C) (Axillary)   Resp 15   Ht '5\' 10"'$  (1.778 m)   Wt (!) 149.7 kg   SpO2 100%   BMI 47.35 kg/m  Physical Exam Vitals and nursing note reviewed. Exam conducted with a chaperone present.  Constitutional:      General: He is not in acute distress.    Appearance: He is not ill-appearing.  HENT:     Head: Normocephalic and atraumatic.      Nose: No congestion.  Eyes:     Conjunctiva/sclera: Conjunctivae normal.  Cardiovascular:     Rate and Rhythm: Normal rate and regular rhythm.     Pulses: Normal pulses.     Heart sounds: No murmur heard.    No friction rub. No gallop.  Pulmonary:     Effort: No respiratory distress.     Breath sounds: No wheezing, rhonchi or rales.  Abdominal:     Palpations: Abdomen is soft.     Tenderness: There is no abdominal tenderness. There is no right CVA tenderness or left CVA tenderness.  Genitourinary:    Comments: Chaperone present rectal exam was performed, there  is no noted external hemorrhoids, there is no rectal bleeding or discharge present, digital rectal exam was performed no palpable mass present, patient had frank bloody stool no melena present. Skin:    General: Skin is warm and dry.  Neurological:     Mental Status: He is alert.     GCS: GCS eye subscore is 4. GCS verbal subscore is 5. GCS motor subscore is 6.     Cranial Nerves: Cranial nerves 2-12 are intact. No cranial nerve deficit.     Coordination: Romberg sign negative. Finger-Nose-Finger Test normal.     Comments: Cranial nerves II through XII grossly intact no difficulty with word finding following two-step commands there is no unilateral weakness present.  Psychiatric:        Mood and Affect: Mood normal.     ED Results / Procedures / Treatments   Labs (all labs ordered are listed, but only abnormal results are displayed) Labs Reviewed  COMPREHENSIVE METABOLIC PANEL - Abnormal; Notable for the following components:      Result Value   Potassium 6.6 (*)    CO2 19 (*)    BUN 113 (*)    Creatinine, Ser 5.27 (*)    Calcium 8.2 (*)    Total Protein 8.3 (*)    Alkaline Phosphatase 27 (*)    GFR, Estimated 12 (*)    All other components within normal limits  CBC - Abnormal; Notable for the following components:   WBC 11.1 (*)    RBC 3.52 (*)    Hemoglobin 11.0 (*)    HCT 34.0 (*)    All other components  within normal limits  PROTIME-INR - Abnormal; Notable for the following components:   Prothrombin Time 15.5 (*)    All other components within normal limits  POC OCCULT BLOOD, ED - Abnormal; Notable for the following components:   Fecal Occult Bld POSITIVE (*)    All other components within normal limits  BASIC METABOLIC PANEL  URINALYSIS, ROUTINE W REFLEX MICROSCOPIC  HEMOGLOBIN AND HEMATOCRIT, BLOOD  HEMOGLOBIN AND HEMATOCRIT, BLOOD  HEMOGLOBIN AND HEMATOCRIT, BLOOD  HEMOGLOBIN A1C  CBG MONITORING, ED  TYPE AND SCREEN  PREPARE RBC (CROSSMATCH)    EKG EKG Interpretation  Date/Time:  Thursday November 10 2022 01:05:22 EST Ventricular Rate:  62 PR Interval:    QRS Duration: 90 QT Interval:  401 QTC Calculation: 408 R Axis:   26 Text Interpretation: Sinus rhythm Low voltage, precordial leads No significant change was found Confirmed by Addison Lank [phone]) on 11/10/2022 2:28:33 AM  Radiology CT Head Wo Contrast  Result Date: 11/10/2022 CLINICAL DATA:  New onset headaches EXAM: CT HEAD WITHOUT CONTRAST TECHNIQUE: Contiguous axial images were obtained from the base of the skull through the vertex without intravenous contrast. RADIATION DOSE REDUCTION: This exam was performed according to the departmental dose-optimization program which includes automated exposure control, adjustment of the mA and/or kV according to patient size and/or use of iterative reconstruction technique. COMPARISON:  08/15/2022 FINDINGS: Brain: No evidence of acute infarction, hemorrhage, hydrocephalus, extra-axial collection or mass lesion/mass effect. Vascular: No hyperdense vessel or unexpected calcification. Skull: Normal. Negative for fracture or focal lesion. Sinuses/Orbits: No acute finding. Other: None. IMPRESSION: No acute intracranial abnormality noted. Electronically Signed   By: Inez Catalina M.D.   On: 11/10/2022 00:52    Procedures .Critical Care  Performed by: Marcello Fennel,  PA-C Authorized by: Marcello Fennel, PA-C   Critical care provider statement:    Critical care time (  minutes):  60   Critical care was necessary to treat or prevent imminent or life-threatening deterioration of the following conditions:  Renal failure, metabolic crisis and circulatory failure   Critical care was time spent personally by me on the following activities:  Development of treatment plan with patient or surrogate, discussions with consultants, evaluation of patient's response to treatment, examination of patient, ordering and review of laboratory studies, ordering and review of radiographic studies, ordering and performing treatments and interventions, pulse oximetry, re-evaluation of patient's condition and review of old charts   I assumed direction of critical care for this patient from another provider in my specialty: no     Care discussed with: admitting provider       Medications Ordered in ED Medications  0.9 %  sodium chloride infusion (Manually program via Guardrails IV Fluids) (0 mLs Intravenous Hold 11/10/22 0154)  0.9 %  sodium chloride infusion ( Intravenous New Bag/Given 11/10/22 0641)  pantoprozole (PROTONIX) 80 mg /NS 100 mL infusion (has no administration in time range)  acetaminophen (TYLENOL) tablet 650 mg (has no administration in time range)    Or  acetaminophen (TYLENOL) suppository 650 mg (has no administration in time range)  ondansetron (ZOFRAN) tablet 4 mg (has no administration in time range)    Or  ondansetron (ZOFRAN) injection 4 mg (has no administration in time range)  insulin aspart (novoLOG) injection 0-9 Units (has no administration in time range)  sodium chloride 0.9 % bolus 1,000 mL (0 mLs Intravenous Stopped 11/10/22 0126)  pantoprazole (PROTONIX) injection 40 mg (40 mg Intravenous Given 11/09/22 2331)  sodium zirconium cyclosilicate (LOKELMA) packet 10 g (10 g Oral Given 11/10/22 0119)  calcium gluconate 1 g/ 50 mL sodium chloride IVPB (0 mg  Intravenous Stopped 11/10/22 0140)  sodium bicarbonate injection 50 mEq (50 mEq Intravenous Given 11/10/22 0112)  albuterol (PROVENTIL) (2.5 MG/3ML) 0.083% nebulizer solution 10 mg (10 mg Nebulization Given 11/10/22 0116)  pantoprazole (PROTONIX) injection 40 mg (40 mg Intravenous Given 11/10/22 5009)    ED Course/ Medical Decision Making/ A&P                             Medical Decision Making Amount and/or Complexity of Data Reviewed Labs: ordered. Radiology: ordered.  Risk Prescription drug management. Decision regarding hospitalization.   This patient presents to the ED for concern of GI bleed, this involves an extensive number of treatment options, and is a complaint that carries with it a high risk of complications and morbidity.  The differential diagnosis includes anemia, bowel ischemia, diverticulitis, cranial bleed    Additional history obtained:  Additional history obtained from N/A  External records from outside source obtained and reviewed including GI notes   Co morbidities that complicate the patient evaluation  On anticoag's  Social Determinants of Health:  N/A    Lab Tests:  I Ordered, and personally interpreted labs.  The pertinent results include: Hemoccult positive, CBC shows leukocytosis of 11.1, hemoglobin 11.0, CMP shows potassium 6.6, CO2 of 19, BUN 113, creatinine 5, alk phos 27, GFR 12, prothrombin 15.5  Imaging Studies ordered:  I ordered imaging studies including CT head I independently visualized and interpreted imaging which showed negative acute findings I agree with the radiologist interpretation   Cardiac Monitoring:  The patient was maintained on a cardiac monitor.  I personally viewed and interpreted the cardiac monitored which showed an underlying rhythm of: EKG without signs of ischemia  Medicines ordered and prescription drug management:  I ordered medication including fluids I have reviewed the patients home medicines and  have made adjustments as needed  Critical Interventions:  Patient had gross hematuria as urine during my examination, his BP is soft, he is currently on anticoag's, I am concerned patient has a low hemoglobin at this time, will provide him with a blood transfusion. Patient was reassessed after blood transfusion and walls fluids, BP has remained stable and has improved we will continue to monitor Lab work shows hyperkalemia with an elevated BUN, will provide temporary measures.   Reevaluation:  Due to sitting with GI bleed likely causing acute AKI as well as uremia will consult with critical care for further recommendations  Updated the patient on recommendations from recommendations from critical care, recommend hospitalist admission history of this plan will consult with hospitalist.  Consultations Obtained:  I requested consultation with the Dr Lucile Shutters intensivist,  and discussed lab and imaging findings as well as pertinent plan - they recommend: Feel as long as patient is hemodynamically stable patient does not seem to need ICU placement at this time.  They can be available for consultation if symptoms change Spoke with Dr. Vallarie Mare hospitalist team will admit the patient Sent a secure chat to Dr. Paulita Fujita regards to the patient will need formal consultation    Test Considered:  N/A    Rule out low suspicion for internal head bleed and or mass as CT imaging is negative for acute findings.  Low suspicion for CVA, as she has no focal deficit present on my exam.  Patient is endorsing paresthesias in the hands and feet, this is atypical of CVA, more consistent with neuropathy patient does have history of this.  I doubt patient will need emergent dialysis as he has no new oxygen requirements, no EKG changes, patient is still making urine.  He does have a slightly elevated creatinine and BUN and AKI, my suspicion for this is multifactorial, suspect prerenal, poor perfusion from GI bleed.   Suspicion for ruptured stomach ulcer, diverticuli still at this time as his abdomen is soft nontender, he is afebrile nontachycardic.  He does have a slightly elevated white count but expect this more acute phase reactant.   Dispostion and problem list  After consideration of the diagnostic results and the patients response to treatment, I feel that the patent would benefit from admission.  GI bleed-currently on Protonix, will need formal consultation by GI suspect likely need an upper and lower scope for further evaluation. AKI, BUN elevated potassium-suspect this is all secondary due to hypoperfusion from GI bleed, patient will need further monitoring, if not improving with fluids blood transfusion would recommend formal consultation by nephrology.            Final Clinical Impression(s) / ED Diagnoses Final diagnoses:  Gastrointestinal hemorrhage, unspecified gastrointestinal hemorrhage type  Hyperkalemia  AKI (acute kidney injury) [facility])    Rx / DC Orders ED Discharge Orders     None         Marcello Fennel, PA-C 11/10/22 0646    Fatima Blank, MD 11/10/22 [phone]

## 2022-11-10 ENCOUNTER — Inpatient Hospital Stay (HOSPITAL_COMMUNITY): Payer: Medicaid Other

## 2022-11-10 ENCOUNTER — Emergency Department (HOSPITAL_COMMUNITY): Payer: Medicaid Other

## 2022-11-10 ENCOUNTER — Encounter (HOSPITAL_COMMUNITY): Payer: Self-pay | Admitting: Internal Medicine

## 2022-11-10 ENCOUNTER — Encounter (HOSPITAL_COMMUNITY)
Admission: EM | Disposition: A | Discharge status: Home / Self Care | Payer: Self-pay | Source: Home / Self Care | Attending: Internal Medicine

## 2022-11-10 DIAGNOSIS — E1142 Type 2 diabetes mellitus with diabetic polyneuropathy: Secondary | ICD-10-CM | POA: Diagnosis present

## 2022-11-10 DIAGNOSIS — E875 Hyperkalemia: Secondary | ICD-10-CM | POA: Diagnosis present

## 2022-11-10 DIAGNOSIS — E1122 Type 2 diabetes mellitus with diabetic chronic kidney disease: Secondary | ICD-10-CM

## 2022-11-10 DIAGNOSIS — I959 Hypotension, unspecified: Secondary | ICD-10-CM | POA: Diagnosis present

## 2022-11-10 DIAGNOSIS — I160 Hypertensive urgency: Secondary | ICD-10-CM | POA: Diagnosis present

## 2022-11-10 DIAGNOSIS — I130 Hypertensive heart and chronic kidney disease with heart failure and stage 1 through stage 4 chronic kidney disease, or unspecified chronic kidney disease: Secondary | ICD-10-CM | POA: Diagnosis present

## 2022-11-10 DIAGNOSIS — I77810 Thoracic aortic ectasia: Secondary | ICD-10-CM | POA: Diagnosis present

## 2022-11-10 DIAGNOSIS — K922 Gastrointestinal hemorrhage, unspecified: Secondary | ICD-10-CM | POA: Diagnosis not present

## 2022-11-10 DIAGNOSIS — Z86711 Personal history of pulmonary embolism: Secondary | ICD-10-CM

## 2022-11-10 DIAGNOSIS — N179 Acute kidney failure, unspecified: Secondary | ICD-10-CM | POA: Diagnosis present

## 2022-11-10 DIAGNOSIS — K625 Hemorrhage of anus and rectum: Secondary | ICD-10-CM | POA: Diagnosis not present

## 2022-11-10 DIAGNOSIS — Z794 Long term (current) use of insulin: Secondary | ICD-10-CM | POA: Diagnosis not present

## 2022-11-10 DIAGNOSIS — G894 Chronic pain syndrome: Secondary | ICD-10-CM | POA: Diagnosis present

## 2022-11-10 DIAGNOSIS — N1832 Chronic kidney disease, stage 3b: Secondary | ICD-10-CM

## 2022-11-10 DIAGNOSIS — G4733 Obstructive sleep apnea (adult) (pediatric): Secondary | ICD-10-CM

## 2022-11-10 DIAGNOSIS — N189 Chronic kidney disease, unspecified: Secondary | ICD-10-CM | POA: Diagnosis present

## 2022-11-10 DIAGNOSIS — E1140 Type 2 diabetes mellitus with diabetic neuropathy, unspecified: Secondary | ICD-10-CM | POA: Diagnosis present

## 2022-11-10 DIAGNOSIS — I5032 Chronic diastolic (congestive) heart failure: Secondary | ICD-10-CM | POA: Diagnosis present

## 2022-11-10 DIAGNOSIS — F32A Depression, unspecified: Secondary | ICD-10-CM | POA: Diagnosis present

## 2022-11-10 DIAGNOSIS — Z6841 Body Mass Index (BMI) 40.0 and over, adult: Secondary | ICD-10-CM | POA: Diagnosis not present

## 2022-11-10 DIAGNOSIS — D539 Nutritional anemia, unspecified: Secondary | ICD-10-CM | POA: Diagnosis present

## 2022-11-10 DIAGNOSIS — E785 Hyperlipidemia, unspecified: Secondary | ICD-10-CM | POA: Diagnosis present

## 2022-11-10 DIAGNOSIS — E538 Deficiency of other specified B group vitamins: Secondary | ICD-10-CM | POA: Diagnosis present

## 2022-11-10 DIAGNOSIS — E1165 Type 2 diabetes mellitus with hyperglycemia: Secondary | ICD-10-CM | POA: Diagnosis present

## 2022-11-10 DIAGNOSIS — E639 Nutritional deficiency, unspecified: Secondary | ICD-10-CM | POA: Diagnosis present

## 2022-11-10 DIAGNOSIS — K648 Other hemorrhoids: Secondary | ICD-10-CM | POA: Diagnosis present

## 2022-11-10 DIAGNOSIS — E872 Acidosis, unspecified: Secondary | ICD-10-CM | POA: Diagnosis not present

## 2022-11-10 HISTORY — PX: GIVENS CAPSULE STUDY: SHX5432

## 2022-11-10 LAB — CBC
HCT: 33.4 % — ABNORMAL LOW (ref 39.0–52.0)
Hemoglobin: 10.7 g/dL — ABNORMAL LOW (ref 13.0–17.0)
MCH: 30.7 pg (ref 26.0–34.0)
MCHC: 32 g/dL (ref 30.0–36.0)
MCV: 96 fL (ref 80.0–100.0)
Platelets: 216 10*3/uL (ref 150–400)
RBC: 3.48 MIL/uL — ABNORMAL LOW (ref 4.22–5.81)
RDW: 14.9 % (ref 11.5–15.5)
WBC: 9 10*3/uL (ref 4.0–10.5)
nRBC: 0 % (ref 0.0–0.2)

## 2022-11-10 LAB — BASIC METABOLIC PANEL
Anion gap: 14 (ref 5–15)
BUN: 104 mg/dL — ABNORMAL HIGH (ref 6–20)
CO2: 18 mmol/L — ABNORMAL LOW (ref 22–32)
Calcium: 7.8 mg/dL — ABNORMAL LOW (ref 8.9–10.3)
Chloride: 109 mmol/L (ref 98–111)
Creatinine, Ser: 4.93 mg/dL — ABNORMAL HIGH (ref 0.61–1.24)
GFR, Estimated: 13 mL/min — ABNORMAL LOW (ref >60)
Glucose, Bld: 90 mg/dL (ref 70–99)
Potassium: 5.5 mmol/L — ABNORMAL HIGH (ref 3.5–5.1)
Sodium: 141 mmol/L (ref 135–145)

## 2022-11-10 LAB — GLUCOSE, CAPILLARY: Glucose-Capillary: 153 mg/dL — ABNORMAL HIGH (ref 70–99)

## 2022-11-10 LAB — URINALYSIS, ROUTINE W REFLEX MICROSCOPIC
Bilirubin Urine: NEGATIVE
Glucose, UA: 150 mg/dL — AB
Hgb urine dipstick: NEGATIVE
Ketones, ur: NEGATIVE mg/dL
Leukocytes,Ua: NEGATIVE
Nitrite: NEGATIVE
Protein, ur: 30 mg/dL — AB
Specific Gravity, Urine: 1.013 (ref 1.005–1.030)
pH: 5 (ref 5.0–8.0)

## 2022-11-10 LAB — COMPREHENSIVE METABOLIC PANEL
ALT: 15 U/L (ref 0–44)
AST: 16 U/L (ref 15–41)
Albumin: 4 g/dL (ref 3.5–5.0)
Alkaline Phosphatase: 27 U/L — ABNORMAL LOW (ref 38–126)
Anion gap: 14 (ref 5–15)
BUN: 113 mg/dL — ABNORMAL HIGH (ref 6–20)
CO2: 19 mmol/L — ABNORMAL LOW (ref 22–32)
Calcium: 8.2 mg/dL — ABNORMAL LOW (ref 8.9–10.3)
Chloride: 109 mmol/L (ref 98–111)
Creatinine, Ser: 5.27 mg/dL — ABNORMAL HIGH (ref 0.61–1.24)
GFR, Estimated: 12 mL/min — ABNORMAL LOW (ref >60)
Glucose, Bld: 70 mg/dL (ref 70–99)
Potassium: 6.6 mmol/L (ref 3.5–5.1)
Sodium: 142 mmol/L (ref 135–145)
Total Bilirubin: 0.6 mg/dL (ref 0.3–1.2)
Total Protein: 8.3 g/dL — ABNORMAL HIGH (ref 6.5–8.1)

## 2022-11-10 LAB — CBG MONITORING, ED
Glucose-Capillary: 90 mg/dL (ref 70–99)
Glucose-Capillary: 86 mg/dL (ref 70–99)
Glucose-Capillary: 149 mg/dL — ABNORMAL HIGH (ref 70–99)

## 2022-11-10 LAB — HEMOGLOBIN AND HEMATOCRIT, BLOOD
HCT: 33.2 % — ABNORMAL LOW (ref 39.0–52.0)
Hemoglobin: 10.8 g/dL — ABNORMAL LOW (ref 13.0–17.0)

## 2022-11-10 LAB — RENAL FUNCTION PANEL
Albumin: 4 g/dL (ref 3.5–5.0)
Anion gap: 11 (ref 5–15)
BUN: 101 mg/dL — ABNORMAL HIGH (ref 6–20)
CO2: 20 mmol/L — ABNORMAL LOW (ref 22–32)
Calcium: 7.6 mg/dL — ABNORMAL LOW (ref 8.9–10.3)
Chloride: 108 mmol/L (ref 98–111)
Creatinine, Ser: 4.24 mg/dL — ABNORMAL HIGH (ref 0.61–1.24)
GFR, Estimated: 15 mL/min — ABNORMAL LOW (ref >60)
Glucose, Bld: 146 mg/dL — ABNORMAL HIGH (ref 70–99)
Phosphorus: 6.9 mg/dL — ABNORMAL HIGH (ref 2.5–4.6)
Potassium: 6.4 mmol/L (ref 3.5–5.1)
Sodium: 139 mmol/L (ref 135–145)

## 2022-11-10 LAB — HEMOGLOBIN A1C
Hgb A1c MFr Bld: 9.5 % — ABNORMAL HIGH (ref 4.8–5.6)
Mean Plasma Glucose: 225.95 mg/dL

## 2022-11-10 LAB — PREPARE RBC (CROSSMATCH)

## 2022-11-10 LAB — MRSA NEXT GEN BY PCR, NASAL: MRSA by PCR Next Gen: NOT DETECTED

## 2022-11-10 SURGERY — IMAGING PROCEDURE, GI TRACT, INTRALUMINAL, VIA CAPSULE
Anesthesia: LOCAL

## 2022-11-10 MED ORDER — SODIUM CHLORIDE 0.9 % IV BOLUS
250.0000 mL | Freq: Once | INTRAVENOUS | Status: AC
  Administered 2022-11-11: 250 mL via INTRAVENOUS

## 2022-11-10 MED ORDER — ORAL CARE MOUTH RINSE: 15.0000 mL | OROMUCOSAL | Status: DC | PRN

## 2022-11-10 MED ORDER — ACETAMINOPHEN 650 MG RE SUPP: 650.0000 mg | Freq: Four times a day (QID) | RECTAL | Status: DC | PRN

## 2022-11-10 MED ORDER — PANTOPRAZOLE INFUSION (NEW) - SIMPLE MED
8.0000 mg/h | INTRAVENOUS | Status: DC
  Administered 2022-11-10 – 2022-11-12 (×4): 8 mg/h via INTRAVENOUS
  Filled 2022-11-10 (×2): qty 80
  Filled 2022-11-10: qty 100
  Filled 2022-11-10: qty 80
  Filled 2022-11-10: qty 100
  Filled 2022-11-10: qty 80
  Filled 2022-11-10: qty 100

## 2022-11-10 MED ORDER — MIDODRINE HCL 5 MG PO TABS
10.0000 mg | ORAL_TABLET | Freq: Three times a day (TID) | ORAL | Status: DC
  Administered 2022-11-10 – 2022-11-11 (×2): 10 mg via ORAL
  Filled 2022-11-10 (×2): qty 2

## 2022-11-10 MED ORDER — INSULIN ASPART 100 UNIT/ML IJ SOLN
0.0000 [IU] | INTRAMUSCULAR | Status: DC
  Administered 2022-11-10 – 2022-11-11 (×5): 2 [IU] via SUBCUTANEOUS
  Administered 2022-11-11: 3 [IU] via SUBCUTANEOUS
  Administered 2022-11-11: 5 [IU] via SUBCUTANEOUS
  Administered 2022-11-12 (×4): 2 [IU] via SUBCUTANEOUS
  Administered 2022-11-12: 1 [IU] via SUBCUTANEOUS
  Administered 2022-11-12: 3 [IU] via SUBCUTANEOUS
  Administered 2022-11-13 (×3): 2 [IU] via SUBCUTANEOUS
  Filled 2022-11-10: qty 0.09

## 2022-11-10 MED ORDER — ACETAMINOPHEN 325 MG PO TABS: 650.0000 mg | ORAL_TABLET | Freq: Four times a day (QID) | ORAL | Status: DC | PRN

## 2022-11-10 MED ORDER — SODIUM CHLORIDE 0.9 % IV SOLN: INTRAVENOUS | Status: AC

## 2022-11-10 MED ORDER — CALCIUM GLUCONATE-NACL 1-0.675 GM/50ML-% IV SOLN
1.0000 g | Freq: Once | INTRAVENOUS | Status: AC
  Administered 2022-11-10: 1000 mg via INTRAVENOUS
  Filled 2022-11-10: qty 50

## 2022-11-10 MED ORDER — SODIUM ZIRCONIUM CYCLOSILICATE 10 G PO PACK
10.0000 g | PACK | Freq: Once | ORAL | Status: AC
  Administered 2022-11-11: 10 g via ORAL
  Filled 2022-11-10: qty 1

## 2022-11-10 MED ORDER — SODIUM ZIRCONIUM CYCLOSILICATE 10 G PO PACK
10.0000 g | PACK | Freq: Once | ORAL | Status: AC
  Administered 2022-11-10: 10 g via ORAL
  Filled 2022-11-10: qty 1

## 2022-11-10 MED ORDER — ONDANSETRON HCL 4 MG PO TABS: 4.0000 mg | ORAL_TABLET | Freq: Four times a day (QID) | ORAL | Status: DC | PRN

## 2022-11-10 MED ORDER — ONDANSETRON HCL 4 MG/2ML IJ SOLN: 4.0000 mg | Freq: Four times a day (QID) | INTRAMUSCULAR | Status: DC | PRN

## 2022-11-10 MED ORDER — ALBUTEROL SULFATE (2.5 MG/3ML) 0.083% IN NEBU
10.0000 mg | INHALATION_SOLUTION | Freq: Once | RESPIRATORY_TRACT | Status: AC
  Administered 2022-11-10: 10 mg via RESPIRATORY_TRACT
  Filled 2022-11-10: qty 12

## 2022-11-10 MED ORDER — SODIUM CHLORIDE 0.9 % IV SOLN: INTRAVENOUS | Status: DC

## 2022-11-10 MED ORDER — SODIUM BICARBONATE 8.4 % IV SOLN
50.0000 meq | Freq: Once | INTRAVENOUS | Status: AC
  Administered 2022-11-10: 50 meq via INTRAVENOUS
  Filled 2022-11-10: qty 50

## 2022-11-10 MED ORDER — PANTOPRAZOLE SODIUM 40 MG IV SOLR
40.0000 mg | Freq: Once | INTRAVENOUS | Status: AC
  Administered 2022-11-10: 40 mg via INTRAVENOUS
  Filled 2022-11-10: qty 10

## 2022-11-10 MED ORDER — SALINE SPRAY 0.65 % NA SOLN
1.0000 | NASAL | Status: DC | PRN
  Filled 2022-11-10: qty 44

## 2022-11-10 MED ORDER — CHLORHEXIDINE GLUCONATE CLOTH 2 % EX PADS
6.0000 | MEDICATED_PAD | Freq: Every day | CUTANEOUS | Status: DC
  Administered 2022-11-10 – 2022-11-13 (×4): 6 via TOPICAL

## 2022-11-10 SURGICAL SUPPLY — 1 items: TOWEL COTTON PACK 4EA (MISCELLANEOUS) ×2 IMPLANT

## 2022-11-10 NOTE — Assessment & Plan Note (Signed)
Hold Eliquis due to GI bleeding.

## 2022-11-10 NOTE — Assessment & Plan Note (Signed)
Continue with Ivf. Repeat BMP. EDP gave IV calcium, IV bicarb but no IV insulin or IV Dextrose. EKG today was without hyperpeaked T-waves

## 2022-11-10 NOTE — Consult Note (Signed)
[facility]  HISTORY AND PHYSICAL  [name] is an 62 y.o. male.    Chief Complaint: bloody stools  HPI: Pt is 88M with a PMH sig for HTN, HLD,  DM II, h/o DVT. PE, morbid obesity, dCHF, and recurrent AKI who presents to [facility] ED for evaluation of bloody stools.  Pt reports several day history of bloody and black stools.    Noted Cr 5. 27, K 6.6, and Hgb 11.0.  Hypotensive and actively bleeding.  Got IVFs, prBCs, PPI gtt, treatment for hyperkalemia.  In this setting we are asked to see.    Pt reports continued bloody BM in ED.  GI has seen, NPO for possible interventions.  Cr down to 4.9 with resusciation and K 5.5, got another dose of lokelma.    He is sitting in a chair- gurney is too uncomfortable.  Takes Lasix and K supps at home.  Also ACEi.    PMH: Past Medical History:  Diagnosis Date   Anemia    Arthritis    Back pain    CAD (coronary artery disease)    a. s/p DES to LAD in 05/2016   Cervical radiculopathy    Chronic diastolic CHF (congestive heart failure) (HCC)    Chronic pain    Deep vein thrombosis ([place]) 01/06/2016   Formatting of this note might be different from the original.  Formatting of this note might be different from the original. Last Assessment & Plan: Management per primary care. Last Assessment & Plan: Management per primary care.  Formatting of this note might be different from the original. Last Assessment & Plan: Management per primary care. Last Assessment & Plan: Management per primary care.   Depression    DVT (deep venous thrombosis) (HCC)    Hematemesis    Hepatic steatosis    Hyperlipidemia    Hypertension    IBS (irritable bowel syndrome)    Morbid obesity (HCC)    OSA (obstructive sleep apnea)    Pancreatitis    PE (pulmonary thromboembolism) ([place])    Peripheral neuropathy    PUD (peptic ulcer disease)    Pulmonary embolism ([place]) 07/15/2021   Renal disorder    Stroke [facility])    a. ?details unclear - not seen on imaging  when he was admitted in 05/2017 for TIA symptoms which were felt due to cervical radiculopathy.   Thoracic aortic ectasia (HCC)    a. 4.3cm ectatic ascending thoracic aorta by CT 06/2017.    Type 2 diabetes mellitus (HCC)    PSH: Past Surgical History:  Procedure Laterality Date   CARDIAC CATHETERIZATION N/A 05/31/2016   Procedure: Left Heart Cath and Coronary Angiography;  Surgeon: Peter M Martinique, MD;  Location: [place] [facility];  Service: Cardiovascular;  Laterality: N/A;   CARDIAC CATHETERIZATION N/A 05/31/2016   Procedure: Intravascular Pressure Wire/FFR Study;  Surgeon: Peter M Martinique, MD;  Location: [place] [facility];  Service: Cardiovascular;  Laterality: N/A;   CARDIAC CATHETERIZATION N/A 05/31/2016   Procedure: Coronary Stent Intervention;  Surgeon: Peter M Martinique, MD;  Location: [place] [facility];  Service: Cardiovascular;  Laterality: N/A;   COLONOSCOPY N/A 07/17/2021   Procedure: COLONOSCOPY;  Surgeon: Wilford Corner, MD;  Location: [facility];  Service: Endoscopy;  Laterality: N/A;   COLONOSCOPY WITH PROPOFOL N/A 04/29/2020   Procedure: COLONOSCOPY WITH PROPOFOL;  Surgeon: Wilford Corner, MD;  Location: [place];  Service: Endoscopy;  Laterality: N/A;   ESOPHAGOGASTRODUODENOSCOPY N/A 04/29/2020   Procedure: ESOPHAGOGASTRODUODENOSCOPY (EGD);  Surgeon: Wilford Corner, MD;  Location: [place];  Service: Endoscopy;  Laterality: N/A;   ESOPHAGOGASTRODUODENOSCOPY (EGD) WITH PROPOFOL N/A 07/17/2021   Procedure: ESOPHAGOGASTRODUODENOSCOPY (EGD) WITH PROPOFOL;  Surgeon: Wilford Corner, MD;  Location: [facility];  Service: Endoscopy;  Laterality: N/A;   LEFT HEART CATH AND CORONARY ANGIOGRAPHY N/A 12/08/2017   Procedure: LEFT HEART CATH AND CORONARY ANGIOGRAPHY;  Surgeon: Leonie Man, MD;  Location: [place] [facility];  Service: Cardiovascular;  Laterality: N/A;   LEFT HEART CATHETERIZATION WITH CORONARY ANGIOGRAM N/A 02/03/2014   Procedure: LEFT HEART CATHETERIZATION  WITH CORONARY ANGIOGRAM;  Surgeon: Pixie Casino, MD;  Location: [facility] CATH LAB;  Service: Cardiovascular;  Laterality: N/A;   left leg stent      POLYPECTOMY  04/29/2020   Procedure: POLYPECTOMY;  Surgeon: Wilford Corner, MD;  Location: [facility] ENDOSCOPY;  Service: Endoscopy;;   POLYPECTOMY  07/17/2021   Procedure: POLYPECTOMY;  Surgeon: Wilford Corner, MD;  Location: [facility];  Service: Endoscopy;;     Past Medical History:  Diagnosis Date   Anemia    Arthritis    Back pain    CAD (coronary artery disease)    a. s/p DES to LAD in 05/2016   Cervical radiculopathy    Chronic diastolic CHF (congestive heart failure) ([place])    Chronic pain    Deep vein thrombosis ([place]) 01/06/2016   Formatting of this note might be different from the original.  Formatting of this note might be different from the original. Last Assessment & Plan: Management per primary care. Last Assessment & Plan: Management per primary care.  Formatting of this note might be different from the original. Last Assessment & Plan: Management per primary care. Last Assessment & Plan: Management per primary care.   Depression    DVT (deep venous thrombosis) (HCC)    Hematemesis    Hepatic steatosis    Hyperlipidemia    Hypertension    IBS (irritable bowel syndrome)    Morbid obesity (HCC)    OSA (obstructive sleep apnea)    Pancreatitis    PE (pulmonary thromboembolism) ([place])    Peripheral neuropathy    PUD (peptic ulcer disease)    Pulmonary embolism ([place]) 07/15/2021   Renal disorder    Stroke [facility])    a. ?details unclear - not seen on imaging when he was admitted in 05/2017 for TIA symptoms which were felt due to cervical radiculopathy.   Thoracic aortic ectasia (HCC)    a. 4.3cm ectatic ascending thoracic aorta by CT 06/2017.    Type 2 diabetes mellitus (HCC)     Medications:  Scheduled:  sodium chloride   Intravenous Once   insulin aspart  0-9 Units Subcutaneous Q4H    (Not in a hospital  admission)   ALLERGIES:   Allergies  Allergen Reactions   Coconut Flavor [Flavoring Agent] Hives   Ibuprofen Other (See Comments)    Made gastric ulcers worse   Aleve [Naproxen] Other (See Comments)    DUE TO KIDNEYS   Nsaids Other (See Comments)    Stomach ulcers    Other Hives    Nut Allergy   Vascepa [Icosapent Ethyl] Other (See Comments)    headaches, chest pain, similar to sx of a stroke, hypotension     FAM HX: Family History  Problem Relation Age of Onset   Cancer Father    Hypertension Mother    Diabetes Mother    Breast cancer Mother    Hypertension Brother    Diabetes Brother  Hypertension Sister    Diabetes Sister     Social History:   reports that he has never smoked. He has been exposed to tobacco smoke. He has never used smokeless tobacco. He reports that he does not drink alcohol and does not use drugs.  ROS: ROS: all other systems reviewed and are negative except as per HPI  Blood pressure (!) 87/59, pulse 73, temperature 98.2 F (36.8 C), temperature source Oral, resp. rate 16, height '5\' 10"'$  (1.778 m), weight (!) 149.7 kg, SpO2 100 %. PHYSICAL EXAM: Physical Exam GEN NAD, sitting in chair HEENT EOMI PERRL NECK no JVD PULM clear CV RRR ABD  protuberant EXT 1+ LE edema NEURO AAO x 3 nonfocal   Results for orders placed or performed during the hospital encounter of 11/09/22 (from the past 48 hour(s))  POC occult blood, ED     Status: Abnormal   Collection Time: 11/09/22 10:33 PM  Result Value Ref Range   Fecal Occult Bld POSITIVE (A) NEGATIVE  Comprehensive metabolic panel     Status: Abnormal   Collection Time: 11/09/22 10:47 PM  Result Value Ref Range   Sodium 142 135 - 145 mmol/L   Potassium 6.6 (HH) 3.5 - 5.1 mmol/L    Comment: CRITICAL RESULT CALLED TO, READ BACK BY AND VERIFIED WITH DEAN,T RN @ 0010 ON [id number] BY MAHMOUS,S    Chloride 109 98 - 111 mmol/L   CO2 19 (L) 22 - 32 mmol/L   Glucose, Bld 70 70 - 99 mg/dL    Comment:  Glucose reference range applies only to samples taken after fasting for at least 8 hours.   BUN 113 (H) 6 - 20 mg/dL    Comment: RESULT CONFIRMED BY MANUAL DILUTION   Creatinine, Ser 5.27 (H) 0.61 - 1.24 mg/dL   Calcium 8.2 (L) 8.9 - 10.3 mg/dL   Total Protein 8.3 (H) 6.5 - 8.1 g/dL   Albumin 4.0 3.5 - 5.0 g/dL   AST 16 15 - 41 U/L   ALT 15 0 - 44 U/L   Alkaline Phosphatase 27 (L) 38 - 126 U/L   Total Bilirubin 0.6 0.3 - 1.2 mg/dL   GFR, Estimated 12 (L) >60 mL/min    Comment: (NOTE) Calculated using the CKD-EPI Creatinine Equation (2021)    Anion gap 14 5 - 15    Comment: Performed at [facility], [place] [address], [place], [place] [address]  CBC     Status: Abnormal   Collection Time: 11/09/22 10:47 PM  Result Value Ref Range   WBC 11.1 (H) 4.0 - 10.5 K/uL   RBC 3.52 (L) 4.22 - 5.81 MIL/uL   Hemoglobin 11.0 (L) 13.0 - 17.0 g/dL   HCT 34.0 (L) 39.0 - 52.0 %   MCV 96.6 80.0 - 100.0 fL   MCH 31.3 26.0 - 34.0 pg   MCHC 32.4 30.0 - 36.0 g/dL   RDW 14.6 11.5 - 15.5 %   Platelets 238 150 - 400 K/uL   nRBC 0.0 0.0 - 0.2 %    Comment: Performed at [facility], [place] [address] [place], [place] [address]  Type and screen [place]     Status: None (Preliminary result)   Collection Time: 11/09/22 10:47 PM  Result Value Ref Range   ABO/RH(D) O POS    Antibody Screen NEG    Sample Expiration 11/12/2022,2359    Unit Number [id number]    Blood Component Type RED CELLS,LR    Unit  division 00    Status of Unit ISSUED    Transfusion Status OK TO TRANSFUSE    Crossmatch Result      Compatible Performed at [facility] [address] [place], [place] [address]   Hemoglobin A1c     Status: Abnormal   Collection Time: 11/09/22 10:47 PM  Result Value Ref Range   Hgb A1c MFr Bld 9.5 (H) 4.8 - 5.6 %    Comment: (NOTE) Pre diabetes:          5.7%-6.4%  Diabetes:              >6.4%  Glycemic control for    <7.0% adults with diabetes    Mean Plasma Glucose 225.95 mg/dL    Comment: Performed at [facility], [place] [address] [place], [place] [address]  Protime-INR     Status: Abnormal   Collection Time: 11/09/22 11:32 PM  Result Value Ref Range   Prothrombin Time 15.5 (H) 11.4 - 15.2 seconds   INR 1.2 0.8 - 1.2    Comment: (NOTE) INR goal varies based on device and disease states. Performed at [facility], [place] [address] [place], [place] [address]   Prepare RBC (crossmatch)     Status: None   Collection Time: 11/09/22 11:59 PM  Result Value Ref Range   Order Confirmation      ORDER PROCESSED BY BLOOD BANK Performed at [facility] [address], [place], [place] [address]   Urinalysis, Routine w reflex microscopic Urine, Clean Catch     Status: Abnormal   Collection Time: 11/10/22  6:26 AM  Result Value Ref Range   Color, Urine YELLOW YELLOW   APPearance HAZY (A) CLEAR   Specific Gravity, Urine 1.013 1.005 - 1.030   pH 5.0 5.0 - 8.0   Glucose, UA 150 (A) NEGATIVE mg/dL   Hgb urine dipstick NEGATIVE NEGATIVE   Bilirubin Urine NEGATIVE NEGATIVE   Ketones, ur NEGATIVE NEGATIVE mg/dL   Protein, ur 30 (A) NEGATIVE mg/dL   Nitrite NEGATIVE NEGATIVE   Leukocytes,Ua NEGATIVE NEGATIVE   RBC / HPF 0-5 0 - 5 RBC/hpf   WBC, UA 0-5 0 - 5 WBC/hpf   Bacteria, UA RARE (A) NONE SEEN   Squamous Epithelial / HPF 0-5 0 - 5 /HPF    Comment: Performed at [facility], [place] [address] [place], [place] [address]  CBG monitoring, ED     Status: None   Collection Time: 11/10/22  6:38 AM  Result Value Ref Range   Glucose-Capillary 90 70 - 99 mg/dL    Comment: Glucose reference range applies only to samples taken after fasting for at least 8 hours.  Basic metabolic panel     Status: Abnormal   Collection Time: 11/10/22  6:48 AM  Result Value Ref Range   Sodium 141 135 - 145 mmol/L   Potassium 5.5 (H) 3.5 - 5.1 mmol/L   Chloride 109  98 - 111 mmol/L   CO2 18 (L) 22 - 32 mmol/L   Glucose, Bld 90 70 - 99 mg/dL    Comment: Glucose reference range applies only to samples taken after fasting for at least 8 hours.   BUN 104 (H) 6 - 20 mg/dL    Comment: RESULT CONFIRMED BY MANUAL DILUTION   Creatinine, Ser 4.93 (H) 0.61 - 1.24 mg/dL   Calcium 7.8 (L) 8.9 - 10.3 mg/dL   GFR, Estimated 13 (L) >60 mL/min  Comment: (NOTE) Calculated using the CKD-EPI Creatinine Equation (2021)    Anion gap 14 5 - 15    Comment: Performed at [facility], [place] [address], [place], [place] [address]  CBG monitoring, ED     Status: None   Collection Time: 11/10/22  7:51 AM  Result Value Ref Range   Glucose-Capillary 86 70 - 99 mg/dL    Comment: Glucose reference range applies only to samples taken after fasting for at least 8 hours.  Hemoglobin and hematocrit, blood     Status: Abnormal   Collection Time: 11/10/22  7:56 AM  Result Value Ref Range   Hemoglobin 10.8 (L) 13.0 - 17.0 g/dL   HCT 33.2 (L) 39.0 - 52.0 %    Comment: Performed at [facility], [place] [address], [place], [place] [address]    US RENAL  Result Date: 11/10/2022 CLINICAL DATA:  Acute kidney injury EXAM: RENAL / URINARY TRACT ULTRASOUND COMPLETE COMPARISON:  Renal ultrasound dated January 12, 2022 FINDINGS: Right Kidney: Renal measurements: 12.5 x 5.8 x 5.5 cm = volume: 207.6 mL. Echogenicity within normal limits. Probable moderate left hydronephrosis. Left Kidney: Renal measurements: 11.3 x 5.7 x 5.7 cm = volume: 190.2 mL. Echogenicity within normal limits. No mass or hydronephrosis visualized. Bladder: Appears normal for degree of bladder distention. Prevoid volume is 431.5 mL. Other: Poor visualization of the bilateral kidneys due to patient body habitus IMPRESSION: Probable moderate left hydronephrosis, poor visualization of the left kidney somewhat limits evaluation. Recommend CT for further evaluation. Electronically Signed   By: Yetta Glassman M.D.   On: 11/10/2022 09:05   CT Head Wo Contrast  Result Date: 11/10/2022 CLINICAL DATA:  New onset headaches EXAM: CT HEAD WITHOUT CONTRAST TECHNIQUE: Contiguous axial images were obtained from the base of the skull through the vertex without intravenous contrast. RADIATION DOSE REDUCTION: This exam was performed according to the departmental dose-optimization program which includes automated exposure control, adjustment of the mA and/or kV according to patient size and/or use of iterative reconstruction technique. COMPARISON:  08/15/2022 FINDINGS: Brain: No evidence of acute infarction, hemorrhage, hydrocephalus, extra-axial collection or mass lesion/mass effect. Vascular: No hyperdense vessel or unexpected calcification. Skull: Normal. Negative for fracture or focal lesion. Sinuses/Orbits: No acute finding. Other: None. IMPRESSION: No acute intracranial abnormality noted. Electronically Signed   By: Inez Catalina M.D.   On: 11/10/2022 00:52    Assessment/Plan  AKI: Last Cr in CareEverywhere 07/2022 was 0.88.  Now up to 5.2--> 4.9 in the setting of GI bleed.  Making urine.  Renal US has L sided hydro.   - supportive care  - IVFs and blood  - hold Lasix/ K supps/ ACEi  - get CT to eval L hydro  - insert Foley  - no indication for RRT but will closely follow  2.  Gi bleed:  - NPO  - blood  - PPI gtt  - GI following  3.  Hyperkalemia:  - better after rx  - PM labs  4.  dCHF:  - close attn to fluids- has swelling but doesn't appear overloaded on top of everything  5.  DM II:  - per primary  6.  Dispo: admitted  Madelon Lips 11/10/2022, 1:35 PM

## 2022-11-10 NOTE — Assessment & Plan Note (Signed)
Admit to SDU. Will need GI consult. Saw Dr. Michail Sermon in the past. Start IV protonix infusion. Pt elevated BUN. Could be from UGI bleeding vs ARF.

## 2022-11-10 NOTE — Assessment & Plan Note (Signed)
Hold lasix, acei due to ARF.

## 2022-11-10 NOTE — Assessment & Plan Note (Signed)
BMI 47 

## 2022-11-10 NOTE — Progress Notes (Signed)
This nurse asked Dr. Tawanna Solo about the current order for foley catheter insertion (patient currently urinating with urinal without complication). Do we need to insert foley catheter as patient is not having retension issues at this time.   Per provider, no foley catheter insertion unless patient has retension concerns. Continue with current plan of care and inform doctor of any sudden changes in status.

## 2022-11-10 NOTE — Assessment & Plan Note (Signed)
Chronic. Hold gabapentin due to ARF.

## 2022-11-10 NOTE — Subjective & Objective (Signed)
CC: GI bleeding HPI: 62 year old African-American male history of super morbid obesity BMI 47, type 2 diabetes on insulin, OSA, history of PE on Eliquis, history of recurrent bouts of renal failure followed by outpatient nephrology in [facility] presents to the ER today with reported dark black stools and bright red blood per rectum for several days.  Patient is currently very somnolent and able to stay awake enough to give any sort of history.  History obtained from the triage note and the ED provider.  He was initially hypotensive in the ER with a blood pressure of 89/45.  He was given 1 unit of packed red blood cells.  This has improved his blood pressure slightly...   He was also noted to be in acute renal failure again with a creatinine now of 5.27 and BUN 113.  His potassium level is also elevated at 6.6.  EDP reports blood on his rectal exam.  This was Hemoccult positive.  CT head performed due to headaches and somnolence.  Colonoscopy performed by Dr. Michail Sermon in September 2022 showed 6 mm polyp in the transverse colon that was removed, 5 mm polyp in ascending colon was removed.  There was internal hemorrhoids and diverticulosis in the sigmoid colon.  EDP reports the patient's last dose of Eliquis was on the evening of 11/09/2022.  He is on Eliquis due to history of PE.  [facility] hospitalist contacted for admission.

## 2022-11-10 NOTE — Consult Note (Addendum)
Referring Provider: [facility] Primary Care Physician:  Drue Flirt, MD Primary Gastroenterologist:  Dr. Michail Sermon  Reason for Consultation:  GI bleed  HPI: [name] is a 62 y.o. male with past medical history of recurrent GI bleed, history of pulmonary embolism on Eliquis, history of morbid obesity, diabetes, sleep apnea and coronary artery disease presented to the hospital with GI bleed with dark stool as well as bright red blood per rectum.  Last dose of Eliquis was yesterday evening, September 10, 2023.  Blood work on admission yesterday showed mild drop in hemoglobin to 11, elevated creatinine at 5.27, normal LFTs, and INR at 1.2.  Blood work today showed hemoglobin of 10.8 and Mild improvement in creatinine to 4.93.  Patient seen and examined at bedside.  He is sitting in a chair somewhat sleepy.  He is complaining of dark-colored blood as well as blood and blood per rectum which started recently.  Denies any nausea or vomiting.  Denies abdominal pain.   Previous GI workup Last colonoscopy in September 2022 showed fair prep, 2 small polyps, normal TI, diverticulosis and internal hemorrhoids.  Biopsy showed small tubular adenomas.  EGD in September 2022 showed gastritis otherwise no evidence of active bleeding.  He  also underwent EGD and colonoscopy in July 2021 for evaluation of GI bleed  Past Medical History:  Diagnosis Date   Anemia    Arthritis    Back pain    CAD (coronary artery disease)    a. s/p DES to LAD in 05/2016   Cervical radiculopathy    Chronic diastolic CHF (congestive heart failure) (HCC)    Chronic pain    Deep vein thrombosis ([place]) 01/06/2016   Formatting of this note might be different from the original.  Formatting of this note might be different from the original. Last Assessment & Plan: Management per primary care. Last Assessment & Plan: Management per primary care.  Formatting of this note might be different from the original. Last Assessment &  Plan: Management per primary care. Last Assessment & Plan: Management per primary care.   Depression    DVT (deep venous thrombosis) (HCC)    Hematemesis    Hepatic steatosis    Hyperlipidemia    Hypertension    IBS (irritable bowel syndrome)    Morbid obesity (HCC)    OSA (obstructive sleep apnea)    Pancreatitis    PE (pulmonary thromboembolism) ([place])    Peripheral neuropathy    PUD (peptic ulcer disease)    Pulmonary embolism ([place]) 07/15/2021   Renal disorder    Stroke [facility])    a. ?details unclear - not seen on imaging when he was admitted in 05/2017 for TIA symptoms which were felt due to cervical radiculopathy.   Thoracic aortic ectasia (HCC)    a. 4.3cm ectatic ascending thoracic aorta by CT 06/2017.    Type 2 diabetes mellitus ([place])     Past Surgical History:  Procedure Laterality Date   CARDIAC CATHETERIZATION N/A 05/31/2016   Procedure: Left Heart Cath and Coronary Angiography;  Surgeon: Peter M Martinique, MD;  Location: [facility];  Service: Cardiovascular;  Laterality: N/A;   CARDIAC CATHETERIZATION N/A 05/31/2016   Procedure: Intravascular Pressure Wire/FFR Study;  Surgeon: Peter M Martinique, MD;  Location: [place] [facility];  Service: Cardiovascular;  Laterality: N/A;   CARDIAC CATHETERIZATION N/A 05/31/2016   Procedure: Coronary Stent Intervention;  Surgeon: Peter M Martinique, MD;  Location: [place] [facility];  Service: Cardiovascular;  Laterality:  N/A;   COLONOSCOPY N/A 07/17/2021   Procedure: COLONOSCOPY;  Surgeon: Wilford Corner, MD;  Location: [facility];  Service: Endoscopy;  Laterality: N/A;   COLONOSCOPY WITH PROPOFOL N/A 04/29/2020   Procedure: COLONOSCOPY WITH PROPOFOL;  Surgeon: Wilford Corner, MD;  Location: [place];  Service: Endoscopy;  Laterality: N/A;   ESOPHAGOGASTRODUODENOSCOPY N/A 04/29/2020   Procedure: ESOPHAGOGASTRODUODENOSCOPY (EGD);  Surgeon: Wilford Corner, MD;  Location: [place];  Service: Endoscopy;  Laterality: N/A;    ESOPHAGOGASTRODUODENOSCOPY (EGD) WITH PROPOFOL N/A 07/17/2021   Procedure: ESOPHAGOGASTRODUODENOSCOPY (EGD) WITH PROPOFOL;  Surgeon: Wilford Corner, MD;  Location: [facility];  Service: Endoscopy;  Laterality: N/A;   LEFT HEART CATH AND CORONARY ANGIOGRAPHY N/A 12/08/2017   Procedure: LEFT HEART CATH AND CORONARY ANGIOGRAPHY;  Surgeon: Leonie Man, MD;  Location: [place] [facility];  Service: Cardiovascular;  Laterality: N/A;   LEFT HEART CATHETERIZATION WITH CORONARY ANGIOGRAM N/A 02/03/2014   Procedure: LEFT HEART CATHETERIZATION WITH CORONARY ANGIOGRAM;  Surgeon: Pixie Casino, MD;  Location: [facility] CATH LAB;  Service: Cardiovascular;  Laterality: N/A;   left leg stent      POLYPECTOMY  04/29/2020   Procedure: POLYPECTOMY;  Surgeon: Wilford Corner, MD;  Location: [facility] ENDOSCOPY;  Service: Endoscopy;;   POLYPECTOMY  07/17/2021   Procedure: POLYPECTOMY;  Surgeon: Wilford Corner, MD;  Location: [facility];  Service: Endoscopy;;    Prior to Admission medications   Medication Sig Start Date End Date Taking? Authorizing Provider  ACCU-CHEK GUIDE test strip  05/19/22   [provider]  albuterol (VENTOLIN HFA) 108 (90 Base) MCG/ACT inhaler Inhale 2 puffs into the lungs every 6 (six) hours as needed for wheezing or shortness of breath.    [provider]  atorvastatin (LIPITOR) 40 MG tablet Take 40 mg by mouth at bedtime.    [provider]  carvedilol (COREG) 6.25 MG tablet Take 1 tablet (6.25 mg total) by mouth 2 (two) times daily with a meal. 03/26/22   Hosie Poisson, MD  Continuous Blood Gluc Sensor (FREESTYLE LIBRE 2 SENSOR) MISC  05/02/22   [provider]  cyanocobalamin 1000 MCG tablet Take 1,000 mcg by mouth daily. 03/06/20   [provider]  diclofenac (FLECTOR) 1.3 % PTCH Place 1 patch onto the skin 2 (two) times daily as needed (back and knee pain).    [provider]  ELIQUIS 5 MG TABS tablet TAKE 1 TABLET BY MOUTH TWICE A DAY  10/03/22   Wyatt Portela, MD  fenofibrate (TRICOR) 145 MG tablet TAKE 1 TABLET (145 MG TOTAL) BY MOUTH DAILY. 05/19/22   Deberah Pelton, NP  Fluticasone Propionate (FLONASE ALLERGY RELIEF NA) Place 1 spray into the nose daily as needed (congestion).    [provider]  furosemide (LASIX) 40 MG tablet Take 40 mg by mouth 2 (two) times daily.    [provider]  gabapentin (NEURONTIN) 800 MG tablet Take 800 mg by mouth 3 (three) times daily. 09/29/21   [provider]  glipiZIDE (GLUCOTROL) 10 MG tablet Take 10 mg by mouth 2 (two) times daily. 05/30/17   [provider]  HUMULIN R U-500 KWIKPEN 500 UNIT/ML kwikpen Inject 85-110 Units into the skin See admin instructions. Per sliding scale 3 times daily with meals 06/04/20   [provider]  Insulin Pen Needle 31G X 5 MM MISC Use 1 needle daily to inject insulin as prescribed 06/18/17   Barton Dubois, MD  isosorbide mononitrate (IMDUR) 30 MG 24 hr tablet TAKE 1 TABLET  BY MOUTH DAILY Patient taking differently: Take 30 mg by mouth daily. 01/03/22   Hilty, Nadean Corwin, MD  lisinopril (ZESTRIL) 20 MG tablet Take 20 mg by mouth daily.    [provider]  Menthol, Topical Analgesic, (BIOFREEZE EX) Apply 1 application topically 2 (two) times daily as needed (knee pain).    [provider]  nitroGLYCERIN (NITROSTAT) 0.4 MG SL tablet Place 1 tablet (0.4 mg total) under the tongue every 5 (five) minutes as needed for chest pain. 02/06/19   Skeet Latch, MD  ondansetron (ZOFRAN) 4 MG tablet Take 4 mg by mouth 2 (two) times daily as needed for nausea/vomiting, vomiting or nausea. 04/21/20   [provider]  Oxycodone HCl 20 MG TABS Take 20 mg by mouth 4 (four) times daily as needed (pain).    [provider]  pantoprazole (PROTONIX) 40 MG tablet Take 1 tablet by mouth daily. 05/26/22   [provider]  potassium chloride (MICRO-K) 10 MEQ CR capsule Take 2 capsules (20 mEq total)  by mouth 2 (two) times daily. 07/21/22   Lelon Perla, MD  sertraline (ZOLOFT) 50 MG tablet Take 50 mg by mouth every morning. 12/17/20   [provider]  tiZANidine (ZANAFLEX) 4 MG tablet Take 4 mg by mouth at bedtime as needed for muscle spasms. 03/31/20   [provider]  traZODone (DESYREL) 50 MG tablet Take 1 tablet (50 mg total) by mouth at bedtime. 12/15/16   Florinda Marker, MD  Vitamin D, Ergocalciferol, (DRISDOL) 1.25 MG (50000 UT) CAPS capsule Take 50,000 Units by mouth every Monday. 08/15/19   [provider]    Scheduled Meds:  sodium chloride   Intravenous Once   insulin aspart  0-9 Units Subcutaneous Q4H   Continuous Infusions:  sodium chloride 150 mL/hr at 11/10/22 0641   pantoprazole 8 mg/hr (11/10/22 0646)   PRN Meds:.acetaminophen **OR** acetaminophen, ondansetron **OR** ondansetron (ZOFRAN) IV  Allergies as of 11/09/2022 - Review Complete 11/09/2022  Allergen Reaction Noted   Coconut flavor [flavoring agent] Hives 01/26/2017   Ibuprofen Other (See Comments) 01/29/2014   Aleve [naproxen] Other (See Comments) 09/02/2017   Nsaids Other (See Comments) 12/27/2017   Other Hives 01/12/2022   Vascepa [icosapent ethyl] Other (See Comments) 09/30/2019    Family History  Problem Relation Age of Onset   Cancer Father    Hypertension Mother    Diabetes Mother    Breast cancer Mother    Hypertension Brother    Diabetes Brother    Hypertension Sister    Diabetes Sister     Social History   Socioeconomic History   Marital status: Single    Spouse name: Not on file   Number of children: 3   Years of education: 12   Highest education level: Not on file  Occupational History   Occupation: Disabled  Tobacco Use   Smoking status: Never    Passive exposure: Past   Smokeless tobacco: Never  Vaping Use   Vaping Use: Never used  Substance and Sexual Activity   Alcohol use: No   Drug use: No   Sexual activity: Not on file  Other Topics  Concern   Not on file  Social History Narrative   Independent and ambulatory with cane.   Lives at home alone.   Right-handed.   1-2 cups caffeine per day.   Social Determinants of Health   Financial Resource Strain: Not on file  Food Insecurity: Not on file  Transportation Needs: Not  on file  Physical Activity: Not on file  Stress: Not on file  Social Connections: Not on file  Intimate Partner Violence: Not on file    Review of Systems: All negative except as stated above in HPI.  Physical Exam: Vital signs: Vitals:   11/10/22 0600 11/10/22 0815  BP: (!) 92/56 102/88  Pulse: 68 70  Resp: 15   Temp:    SpO2: 100% 99%     General:   Morbidly obese, not in acute distress Lungs: No visible respiratory distress, anterior exam only Heart:  Regular rate and rhythm; no murmurs, clicks, rubs,  or gallops. Abdomen: Obese abdomen with difficult examination but otherwise no acute issues noted.  Abdomen is benign.  Bowel sounds present.  No peritoneal signs. Mood and affect normal Rectal:  Deferred  GI:  Lab Results: Recent Labs    11/09/22 2247 11/10/22 0756  WBC 11.1*  --   HGB 11.0* 10.8*  HCT 34.0* 33.2*  PLT 238  --    BMET Recent Labs    11/09/22 2247 11/10/22 0648  NA 142 141  K 6.6* 5.5*  CL 109 109  CO2 19* 18*  GLUCOSE 70 90  BUN 113* 104*  CREATININE 5.27* 4.93*  CALCIUM 8.2* 7.8*   LFT Recent Labs    11/09/22 2247  PROT 8.3*  ALBUMIN 4.0  AST 16  ALT 15  ALKPHOS 27*  BILITOT 0.6   PT/INR Recent Labs    11/09/22 2332  LABPROT 15.5*  INR 1.2     Studies/Results: US RENAL  Result Date: 11/10/2022 CLINICAL DATA:  Acute kidney injury EXAM: RENAL / URINARY TRACT ULTRASOUND COMPLETE COMPARISON:  Renal ultrasound dated January 12, 2022 FINDINGS: Right Kidney: Renal measurements: 12.5 x 5.8 x 5.5 cm = volume: 207.6 mL. Echogenicity within normal limits. Probable moderate left hydronephrosis. Left Kidney: Renal measurements: 11.3 x 5.7 x 5.7  cm = volume: 190.2 mL. Echogenicity within normal limits. No mass or hydronephrosis visualized. Bladder: Appears normal for degree of bladder distention. Prevoid volume is 431.5 mL. Other: Poor visualization of the bilateral kidneys due to patient body habitus IMPRESSION: Probable moderate left hydronephrosis, poor visualization of the left kidney somewhat limits evaluation. Recommend CT for further evaluation. Electronically Signed   By: Yetta Glassman M.D.   On: 11/10/2022 09:05   CT Head Wo Contrast  Result Date: 11/10/2022 CLINICAL DATA:  New onset headaches EXAM: CT HEAD WITHOUT CONTRAST TECHNIQUE: Contiguous axial images were obtained from the base of the skull through the vertex without intravenous contrast. RADIATION DOSE REDUCTION: This exam was performed according to the departmental dose-optimization program which includes automated exposure control, adjustment of the mA and/or kV according to patient size and/or use of iterative reconstruction technique. COMPARISON:  08/15/2022 FINDINGS: Brain: No evidence of acute infarction, hemorrhage, hydrocephalus, extra-axial collection or mass lesion/mass effect. Vascular: No hyperdense vessel or unexpected calcification. Skull: Normal. Negative for fracture or focal lesion. Sinuses/Orbits: No acute finding. Other: None. IMPRESSION: No acute intracranial abnormality noted. Electronically Signed   By: Inez Catalina M.D.   On: 11/10/2022 00:52    Impression/Plan: -Recurrent GI bleed on a patient who takes anticoagulation for DVT/PE.  Had EGD and colonoscopy in 2021 as well as in 2022 without revealing any acute bleeding. -History of DVT/PE.  Was on Eliquis. -Acute kidney injury -History of coronary artery disease -Morbid obesity -Sleep apnea  Recommendations ------------------------- -Recommend to continue to hold anticoagulation for now. -Plan for capsule endoscopy for further evaluation of recurrent  bleeding.  Risk of capsule retention discussed  with the patient.  CT abdomen pelvis with IV contrast in July 2023 showed no acute GI issues.  -Keep n.p.o. for now  -Monitor H&H. -Continue PPI with history of gastritis -GI will follow    LOS: 0 days   [name]  MD, FACP 11/10/2022, 9:28 AM  Contact #  [phone]

## 2022-11-10 NOTE — Assessment & Plan Note (Signed)
Has had recurrent renal failure. Followed by [facility]. Hold lasix, ACEI. Continue with IVF. Avoid nephrotoxic agents. Avoid NSAIDS. Normal renal U/S in 12-2021.

## 2022-11-10 NOTE — Progress Notes (Addendum)
Givens Capsule endoscopy ordered by MD Brahmbhatt.  Pt ingested capsule at 1552.  Per [facility] Capsule instructions, pt to remain NPO until 1752, at which time, pt may progress to clear liquids (no red liquids). At 1952, pt may have a small snack. Pt may progress to previously ordered diet at 2352.  The capsule study will conclude at 0352 on 1/19. At that time, the leads and recorder can be removed from the pt. Endo staff will pick up the recorder in the AM.  [facility]ens Capsule instructions given to pt and RN. Pt and RN expressed understanding.  Debarah Crape, RN 11/10/22 3:58 PM

## 2022-11-10 NOTE — Assessment & Plan Note (Signed)
Chronic. Placed on SSI for now. Keep NPO. Check A1c.

## 2022-11-10 NOTE — Progress Notes (Signed)
Brief same day note:  Patient is a 62 year old male with history of morbid obesity, diabetes type 2, OSA, history of PE on Eliquis, CKD being followed by [facility] nephrology who presented with complaint of dark stool, bright red blood per rectum for several days.  On presentation, he was somnolent, hypotensive with systolic blood pressure in the range of 80s.  Lab work showed creatinine of 5.2, potassium of 6.6.  Digital examination revealed blood, FOBT positive.  [facility] consulted.  Plan for capsule endoscopy. Patient seen and examined at bedside in the ED.  Hemodynamically stable.  Sitting in the chair.  Completely alert and oriented.  Denies any abdomen pain, nausea or vomiting.Exam was mostly unremarkable.  Assessment and plan:  GI bleed:presented with complaint of dark stool, bright red blood per rectum for several days.  Digital rectal exam revealed blood, FOBT positive Hemoglobin stable in the range of 11. [facility] consulted.  Started on Protonix drip. Plan for capsule endoscopy.  Also ordered CT abdomen/pelvis  AKI/CKD: Follows with nephrology at [facility].  On Lasix, ACE inhibitor at home.  Started on IV fluids.  Renal ultrasound normal as per 3/23.We will get another.He has history of recurrent AKI.  Her last creatinine was 1.27 as per 10/20/ 23.  Will consult nephrology  Hyperkalemia: Given IV calcium, bicarb, dextrose.  EKG did not show any changes.  Continue gentle IV.  Will give Lokelma.  Potassium of 5.5 today  History of PE: On Eliquis which is currently on hold  Diabetic peripheral neuropathy: On gabapentin  OSA: As needed CPAP at home  Chronic diastolic CHF: On Lasix at home which is on hold.  Currently looks euvolemic  Diabetes type 2: Currently on sliding scale insulin.  Monitor blood sugars.  Obesity: BMI of  47

## 2022-11-10 NOTE — ED Notes (Signed)
Patient taken to CT at this time.

## 2022-11-10 NOTE — ED Notes (Signed)
Patient transported to CT 

## 2022-11-10 NOTE — ED Notes (Signed)
Patient provided with recliner chair for comfort.  Call bell and beside table within reach

## 2022-11-10 NOTE — H&P (Signed)
History and Physical    [name] EQA:[id number] DOB: 1961-01-20 DOA: 11/09/2022  DOS: the patient was seen and examined on 11/09/2022  PCP: Drue Flirt, MD   Patient coming from: Home  I have personally briefly reviewed patient's old medical records in [place]  CC: GI bleeding HPI: 62 year old African-American male history of super morbid obesity BMI 47, type 2 diabetes on insulin, OSA, history of PE on Eliquis, history of recurrent bouts of renal failure followed by outpatient nephrology in [facility] presents to the ER today with reported dark black stools and bright red blood per rectum for several days.  Patient is currently very somnolent and able to stay awake enough to give any sort of history.  History obtained from the triage note and the ED provider.  He was initially hypotensive in the ER with a blood pressure of 89/45.  He was given 1 unit of packed red blood cells.  This has improved his blood pressure slightly...   He was also noted to be in acute renal failure again with a creatinine now of 5.27 and BUN 113.  His potassium level is also elevated at 6.6.  EDP reports blood on his rectal exam.  This was Hemoccult positive.  CT head performed due to headaches and somnolence.  Colonoscopy performed by Dr. Michail Sermon in September 2022 showed 6 mm polyp in the transverse colon that was removed, 5 mm polyp in ascending colon was removed.  There was internal hemorrhoids and diverticulosis in the sigmoid colon.  EDP reports the patient's last dose of Eliquis was on the evening of 11/09/2022.  He is on Eliquis due to history of PE.  [facility] contacted for admission.     ED Course: HgB 11.0, in 07-2022, HgB was 12.2.  BUN 113, Scr 5.27, serum potassium 6.6  Review of Systems:  Review of Systems  Unable to perform ROS: Other  Unable to obtain due to somnolence.  Past Medical History:  Diagnosis Date   Anemia    Arthritis    Back pain     CAD (coronary artery disease)    a. s/p DES to LAD in 05/2016   Cervical radiculopathy    Chronic diastolic CHF (congestive heart failure) (HCC)    Chronic pain    Deep vein thrombosis ([place]) 01/06/2016   Formatting of this note might be different from the original.  Formatting of this note might be different from the original. Last Assessment & Plan: Management per primary care. Last Assessment & Plan: Management per primary care.  Formatting of this note might be different from the original. Last Assessment & Plan: Management per primary care. Last Assessment & Plan: Management per primary care.   Depression    DVT (deep venous thrombosis) (HCC)    Hematemesis    Hepatic steatosis    Hyperlipidemia    Hypertension    IBS (irritable bowel syndrome)    Morbid obesity (HCC)    OSA (obstructive sleep apnea)    Pancreatitis    PE (pulmonary thromboembolism) ([place])    Peripheral neuropathy    PUD (peptic ulcer disease)    Pulmonary embolism ([place]) 07/15/2021   Renal disorder    Stroke [facility])    a. ?details unclear - not seen on imaging when he was admitted in 05/2017 for TIA symptoms which were felt due to cervical radiculopathy.   Thoracic aortic ectasia (HCC)    a. 4.3cm ectatic ascending thoracic aorta by CT 06/2017.  Type 2 diabetes mellitus ([place])     Past Surgical History:  Procedure Laterality Date   CARDIAC CATHETERIZATION N/A 05/31/2016   Procedure: Left Heart Cath and Coronary Angiography;  Surgeon: Peter M Martinique, MD;  Location: [place] CV LAB;  Service: Cardiovascular;  Laterality: N/A;   CARDIAC CATHETERIZATION N/A 05/31/2016   Procedure: Intravascular Pressure Wire/FFR Study;  Surgeon: Peter M Martinique, MD;  Location: [place] CV LAB;  Service: Cardiovascular;  Laterality: N/A;   CARDIAC CATHETERIZATION N/A 05/31/2016   Procedure: Coronary Stent Intervention;  Surgeon: Peter M Martinique, MD;  Location: [place] CV LAB;  Service: Cardiovascular;  Laterality: N/A;   COLONOSCOPY  N/A 07/17/2021   Procedure: COLONOSCOPY;  Surgeon: Wilford Corner, MD;  Location: [facility] ENDOSCOPY;  Service: Endoscopy;  Laterality: N/A;   COLONOSCOPY WITH PROPOFOL N/A 04/29/2020   Procedure: COLONOSCOPY WITH PROPOFOL;  Surgeon: Wilford Corner, MD;  Location: [place];  Service: Endoscopy;  Laterality: N/A;   ESOPHAGOGASTRODUODENOSCOPY N/A 04/29/2020   Procedure: ESOPHAGOGASTRODUODENOSCOPY (EGD);  Surgeon: Wilford Corner, MD;  Location: [place];  Service: Endoscopy;  Laterality: N/A;   ESOPHAGOGASTRODUODENOSCOPY (EGD) WITH PROPOFOL N/A 07/17/2021   Procedure: ESOPHAGOGASTRODUODENOSCOPY (EGD) WITH PROPOFOL;  Surgeon: Wilford Corner, MD;  Location: [facility] ENDOSCOPY;  Service: Endoscopy;  Laterality: N/A;   LEFT HEART CATH AND CORONARY ANGIOGRAPHY N/A 12/08/2017   Procedure: LEFT HEART CATH AND CORONARY ANGIOGRAPHY;  Surgeon: Leonie Man, MD;  Location: [place] CV LAB;  Service: Cardiovascular;  Laterality: N/A;   LEFT HEART CATHETERIZATION WITH CORONARY ANGIOGRAM N/A 02/03/2014   Procedure: LEFT HEART CATHETERIZATION WITH CORONARY ANGIOGRAM;  Surgeon: Pixie Casino, MD;  Location: [facility] CATH LAB;  Service: Cardiovascular;  Laterality: N/A;   left leg stent      POLYPECTOMY  04/29/2020   Procedure: POLYPECTOMY;  Surgeon: Wilford Corner, MD;  Location: [facility] ENDOSCOPY;  Service: Endoscopy;;   POLYPECTOMY  07/17/2021   Procedure: POLYPECTOMY;  Surgeon: Wilford Corner, MD;  Location: [facility] ENDOSCOPY;  Service: Endoscopy;;     reports that he has never smoked. He has been exposed to tobacco smoke. He has never used smokeless tobacco. He reports that he does not drink alcohol and does not use drugs.  Allergies  Allergen Reactions   Coconut Flavor [Flavoring Agent] Hives   Ibuprofen Other (See Comments)    Made gastric ulcers worse   Aleve [Naproxen] Other (See Comments)    DUE TO KIDNEYS   Nsaids Other (See Comments)    Stomach ulcers    Other Hives    Nut Allergy   Vascepa  [Icosapent Ethyl] Other (See Comments)    headaches, chest pain, similar to sx of a stroke, hypotension     Family History  Problem Relation Age of Onset   Cancer Father    Hypertension Mother    Diabetes Mother    Breast cancer Mother    Hypertension Brother    Diabetes Brother    Hypertension Sister    Diabetes Sister     Prior to Admission medications   Medication Sig Start Date End Date Taking? Authorizing Provider  ACCU-CHEK GUIDE test strip  05/19/22   [provider]  albuterol (VENTOLIN HFA) 108 (90 Base) MCG/ACT inhaler Inhale 2 puffs into the lungs every 6 (six) hours as needed for wheezing or shortness of breath.    [provider]  atorvastatin (LIPITOR) 40 MG tablet Take 40 mg by mouth at bedtime.    [provider]  carvedilol (COREG) 6.25 MG tablet Take  1 tablet (6.25 mg total) by mouth 2 (two) times daily with a meal. 03/26/22   Hosie Poisson, MD  Continuous Blood Gluc Sensor (FREESTYLE LIBRE 2 SENSOR) MISC  05/02/22   [provider]  cyanocobalamin 1000 MCG tablet Take 1,000 mcg by mouth daily. 03/06/20   [provider]  diclofenac (FLECTOR) 1.3 % PTCH Place 1 patch onto the skin 2 (two) times daily as needed (back and knee pain).    [provider]  ELIQUIS 5 MG TABS tablet TAKE 1 TABLET BY MOUTH TWICE A DAY 10/03/22   Wyatt Portela, MD  fenofibrate (TRICOR) 145 MG tablet TAKE 1 TABLET (145 MG TOTAL) BY MOUTH DAILY. 05/19/22   Deberah Pelton, NP  Fluticasone Propionate (FLONASE ALLERGY RELIEF NA) Place 1 spray into the nose daily as needed (congestion).    [provider]  furosemide (LASIX) 40 MG tablet Take 40 mg by mouth 2 (two) times daily.    [provider]  gabapentin (NEURONTIN) 800 MG tablet Take 800 mg by mouth 3 (three) times daily. 09/29/21   [provider]  glipiZIDE (GLUCOTROL) 10 MG tablet Take 10 mg by mouth 2 (two) times daily. 05/30/17   [provider]  HUMULIN R  U-500 KWIKPEN 500 UNIT/ML kwikpen Inject 85-110 Units into the skin See admin instructions. Per sliding scale 3 times daily with meals 06/04/20   [provider]  Insulin Pen Needle 31G X 5 MM MISC Use 1 needle daily to inject insulin as prescribed 06/18/17   Barton Dubois, MD  isosorbide mononitrate (IMDUR) 30 MG 24 hr tablet TAKE 1 TABLET BY MOUTH DAILY Patient taking differently: Take 30 mg by mouth daily. 01/03/22   Hilty, Nadean Corwin, MD  lisinopril (ZESTRIL) 20 MG tablet Take 20 mg by mouth daily.    [provider]  Menthol, Topical Analgesic, (BIOFREEZE EX) Apply 1 application topically 2 (two) times daily as needed (knee pain).    [provider]  nitroGLYCERIN (NITROSTAT) 0.4 MG SL tablet Place 1 tablet (0.4 mg total) under the tongue every 5 (five) minutes as needed for chest pain. 02/06/19   Skeet Latch, MD  ondansetron (ZOFRAN) 4 MG tablet Take 4 mg by mouth 2 (two) times daily as needed for nausea/vomiting, vomiting or nausea. 04/21/20   [provider]  Oxycodone HCl 20 MG TABS Take 20 mg by mouth 4 (four) times daily as needed (pain).    [provider]  pantoprazole (PROTONIX) 40 MG tablet Take 1 tablet by mouth daily. 05/26/22   [provider]  potassium chloride (MICRO-K) 10 MEQ CR capsule Take 2 capsules (20 mEq total) by mouth 2 (two) times daily. 07/21/22   Lelon Perla, MD  sertraline (ZOLOFT) 50 MG tablet Take 50 mg by mouth every morning. 12/17/20   [provider]  tiZANidine (ZANAFLEX) 4 MG tablet Take 4 mg by mouth at bedtime as needed for muscle spasms. 03/31/20   [provider]  traZODone (DESYREL) 50 MG tablet Take 1 tablet (50 mg total) by mouth at bedtime. 12/15/16   Florinda Marker, MD  Vitamin D, Ergocalciferol, (DRISDOL) 1.25 MG (50000 UT) CAPS capsule Take 50,000 Units by mouth every Monday. 08/15/19   [provider]    Physical Exam: Vitals:   11/10/22 0420 11/10/22 0430 11/10/22  0445 11/10/22 0448  BP: (!) 100/36 112/83 117/80 117/80  Pulse: 62 65 65 65  Resp: '14 14 14 14  '$ Temp: 98.2 F (36.8 C)  98.3 F (36.8 C)  TempSrc: Axillary   Axillary  SpO2: 100% 96% 100% 100%  Weight:      Height:        Physical Exam Vitals and nursing note reviewed.  Constitutional:      General: He is not in acute distress.    Appearance: He is obese.  HENT:     Head: Normocephalic and atraumatic.     Nose: Nose normal.  Cardiovascular:     Rate and Rhythm: Normal rate and regular rhythm.  Pulmonary:     Effort: No respiratory distress.  Abdominal:     General: Abdomen is protuberant.     Tenderness: There is no abdominal tenderness.  Musculoskeletal:     Right lower leg: Edema present.     Left lower leg: Edema present.  Skin:    General: Skin is warm and dry.     Capillary Refill: Capillary refill takes less than 2 seconds.      Labs on Admission: I have personally reviewed following labs and imaging studies  CBC: Recent Labs  Lab 11/09/22 2247  WBC 11.1*  HGB 11.0*  HCT 34.0*  MCV 96.6  PLT 790   Basic Metabolic Panel: Recent Labs  Lab 11/09/22 2247  NA 142  K 6.6*  CL 109  CO2 19*  GLUCOSE 70  BUN 113*  CREATININE 5.27*  CALCIUM 8.2*   GFR: Estimated Creatinine Clearance: 22.1 mL/min (A) (by C-G formula based on SCr of 5.27 mg/dL (H)). Liver Function Tests: Recent Labs  Lab 11/09/22 2247  AST 16  ALT 15  ALKPHOS 27*  BILITOT 0.6  PROT 8.3*  ALBUMIN 4.0   No results for input(s): "LIPASE", "AMYLASE" in the last 168 hours. No results for input(s): "AMMONIA" in the last 168 hours. Coagulation Profile: Recent Labs  Lab 11/09/22 2332  INR 1.2   Cardiac Enzymes: No results for input(s): "CKTOTAL", "CKMB", "CKMBINDEX", "TROPONINI", "TROPONINIHS" in the last 168 hours. BNP (last 3 results) Recent Labs    12/27/21 0950  PROBNP 108   HbA1C: No results for input(s): "HGBA1C" in the last 72 hours. CBG: No results for input(s):  "GLUCAP" in the last 168 hours. Lipid Profile: No results for input(s): "CHOL", "HDL", "LDLCALC", "TRIG", "CHOLHDL", "LDLDIRECT" in the last 72 hours. Thyroid Function Tests: No results for input(s): "TSH", "T4TOTAL", "FREET4", "T3FREE", "THYROIDAB" in the last 72 hours. Anemia Panel: No results for input(s): "VITAMINB12", "FOLATE", "FERRITIN", "TIBC", "IRON", "RETICCTPCT" in the last 72 hours. Urine analysis:    Component Value Date/Time   COLORURINE YELLOW 03/22/2022 1646   APPEARANCEUR HAZY (A) 03/22/2022 1646   LABSPEC 1.013 03/22/2022 1646   PHURINE 5.0 03/22/2022 1646   GLUCOSEU >=500 (A) 03/22/2022 1646   HGBUR NEGATIVE 03/22/2022 1646   BILIRUBINUR NEGATIVE 03/22/2022 1646   KETONESUR NEGATIVE 03/22/2022 1646   PROTEINUR 30 (A) 03/22/2022 1646   UROBILINOGEN 0.2 05/27/2014 2312   NITRITE NEGATIVE 03/22/2022 1646   LEUKOCYTESUR NEGATIVE 03/22/2022 1646    Radiological Exams on Admission: I have personally reviewed images CT Head Wo Contrast  Result Date: 11/10/2022 CLINICAL DATA:  New onset headaches EXAM: CT HEAD WITHOUT CONTRAST TECHNIQUE: Contiguous axial images were obtained from the base of the skull through the vertex without intravenous contrast. RADIATION DOSE REDUCTION: This exam was performed according to the departmental dose-optimization program which includes automated exposure control, adjustment of the mA and/or kV according to patient size and/or use of iterative reconstruction technique. COMPARISON:  08/15/2022 FINDINGS: Brain: No evidence  of acute infarction, hemorrhage, hydrocephalus, extra-axial collection or mass lesion/mass effect. Vascular: No hyperdense vessel or unexpected calcification. Skull: Normal. Negative for fracture or focal lesion. Sinuses/Orbits: No acute finding. Other: None. IMPRESSION: No acute intracranial abnormality noted. Electronically Signed   By: Inez Catalina M.D.   On: 11/10/2022 00:52    EKG: My personal interpretation of EKG shows:  NSR    Assessment/Plan Principal Problem:   GI bleeding Active Problems:   ARF (acute renal failure) (HCC)   Hyperkalemia   DM2 (diabetes mellitus, type 2) (HCC)   Obesity, Class III, BMI 40-49.9 (morbid obesity) (HCC)   Chronic diastolic CHF (congestive heart failure) (HCC)   OSA (obstructive sleep apnea)   Diabetic peripheral neuropathy (HCC)   History of pulmonary embolism    Assessment and Plan: * GI bleeding Admit to SDU. Will need GI consult. Saw Dr. Michail Sermon in the past. Start IV protonix infusion. Pt elevated BUN. Could be from UGI bleeding vs ARF.  ARF (acute renal failure) ([place]) Has had recurrent renal failure. Followed by [facility]. Hold lasix, ACEI. Continue with IVF. Avoid nephrotoxic agents. Avoid NSAIDS. Normal renal U/S in 12-2021.  Hyperkalemia Continue with Ivf. Repeat BMP. EDP gave IV calcium, IV bicarb but no IV insulin or IV Dextrose. EKG today was without hyperpeaked T-waves  History of pulmonary embolism Hold Eliquis due to GI bleeding.  Diabetic peripheral neuropathy (HCC) Chronic. Hold gabapentin due to ARF.  OSA (obstructive sleep apnea) Chronic. Prn cpap  Chronic diastolic CHF (congestive heart failure) (HCC) Hold lasix, acei due to ARF.  Obesity, Class III, BMI 40-49.9 (morbid obesity) (HCC) BMI 47  DM2 (diabetes mellitus, type 2) (HCC) Chronic. Placed on SSI for now. Keep NPO. Check A1c.   DVT prophylaxis: SCDs Code Status: Full Code by default. Pt unable to stay awake long enough to have conversation about Code Status. Family Communication: no family at bedside.  Disposition Plan: return home Consults called: EDP to contact GI consultant  Admission status: Inpatient, Step Down Unit   Kristopher Oppenheim, DO [facility] 11/10/2022, 4:57 AM

## 2022-11-10 NOTE — Assessment & Plan Note (Addendum)
Chronic. Prn cpap

## 2022-11-11 DIAGNOSIS — K922 Gastrointestinal hemorrhage, unspecified: Secondary | ICD-10-CM | POA: Diagnosis not present

## 2022-11-11 LAB — BASIC METABOLIC PANEL
Anion gap: 14 (ref 5–15)
BUN: 91 mg/dL — ABNORMAL HIGH (ref 6–20)
CO2: 18 mmol/L — ABNORMAL LOW (ref 22–32)
Calcium: 7.5 mg/dL — ABNORMAL LOW (ref 8.9–10.3)
Chloride: 109 mmol/L (ref 98–111)
Creatinine, Ser: 3.28 mg/dL — ABNORMAL HIGH (ref 0.61–1.24)
GFR, Estimated: 21 mL/min — ABNORMAL LOW (ref >60)
Glucose, Bld: 207 mg/dL — ABNORMAL HIGH (ref 70–99)
Potassium: 5.3 mmol/L — ABNORMAL HIGH (ref 3.5–5.1)
Sodium: 141 mmol/L (ref 135–145)

## 2022-11-11 LAB — TYPE AND SCREEN
ABO/RH(D): O POS
Antibody Screen: NEGATIVE
Unit division: 0

## 2022-11-11 LAB — BPAM RBC
Blood Product Expiration Date: 202402212359
ISSUE DATE / TIME: 202401180145
Unit Type and Rh: 5100

## 2022-11-11 LAB — GLUCOSE, CAPILLARY
Glucose-Capillary: 168 mg/dL — ABNORMAL HIGH (ref 70–99)
Glucose-Capillary: 180 mg/dL — ABNORMAL HIGH (ref 70–99)
Glucose-Capillary: 189 mg/dL — ABNORMAL HIGH (ref 70–99)
Glucose-Capillary: 196 mg/dL — ABNORMAL HIGH (ref 70–99)
Glucose-Capillary: 206 mg/dL — ABNORMAL HIGH (ref 70–99)
Glucose-Capillary: 259 mg/dL — ABNORMAL HIGH (ref 70–99)

## 2022-11-11 LAB — HEMOGLOBIN AND HEMATOCRIT, BLOOD
HCT: 33.1 % — ABNORMAL LOW (ref 39.0–52.0)
HCT: 31.2 % — ABNORMAL LOW (ref 39.0–52.0)
Hemoglobin: 10.8 g/dL — ABNORMAL LOW (ref 13.0–17.0)
Hemoglobin: 10.1 g/dL — ABNORMAL LOW (ref 13.0–17.0)

## 2022-11-11 MED ORDER — SODIUM BICARBONATE 650 MG PO TABS
650.0000 mg | ORAL_TABLET | Freq: Two times a day (BID) | ORAL | Status: DC
  Administered 2022-11-11 – 2022-11-12 (×4): 650 mg via ORAL
  Filled 2022-11-11 (×4): qty 1

## 2022-11-11 MED ORDER — GABAPENTIN 300 MG PO CAPS
300.0000 mg | ORAL_CAPSULE | Freq: Two times a day (BID) | ORAL | Status: DC
  Administered 2022-11-11: 300 mg via ORAL
  Filled 2022-11-11: qty 1

## 2022-11-11 MED ORDER — MIDODRINE HCL 5 MG PO TABS
5.0000 mg | ORAL_TABLET | Freq: Three times a day (TID) | ORAL | Status: DC
  Administered 2022-11-11 (×2): 5 mg via ORAL
  Filled 2022-11-11 (×2): qty 1

## 2022-11-11 MED ORDER — GABAPENTIN 300 MG PO CAPS: 300.0000 mg | ORAL_CAPSULE | Freq: Every day | ORAL | Status: DC

## 2022-11-11 MED ORDER — OXYCODONE HCL 5 MG PO TABS
5.0000 mg | ORAL_TABLET | Freq: Once | ORAL | Status: AC
  Administered 2022-11-11: 5 mg via ORAL
  Filled 2022-11-11: qty 1

## 2022-11-11 MED ORDER — HYDROCORTISONE ACETATE 25 MG RE SUPP
25.0000 mg | Freq: Two times a day (BID) | RECTAL | Status: DC
  Administered 2022-11-12 – 2022-11-14 (×3): 25 mg via RECTAL
  Filled 2022-11-11 (×12): qty 1

## 2022-11-11 MED ORDER — SODIUM ZIRCONIUM CYCLOSILICATE 10 G PO PACK
10.0000 g | PACK | Freq: Once | ORAL | Status: AC
  Administered 2022-11-11: 10 g via ORAL
  Filled 2022-11-11: qty 1

## 2022-11-11 MED ORDER — SODIUM CHLORIDE 0.9 % IV SOLN: INTRAVENOUS | Status: DC

## 2022-11-11 NOTE — Brief Op Note (Signed)
11/09/2022 - 11/10/2022  3:50 PM  PATIENT:  [name]  62 y.o. male  PRE-OPERATIVE DIAGNOSIS:  Recurrent GI bleed  POST-OPERATIVE DIAGNOSIS:  * No post-op diagnosis entered *  PROCEDURE:  Procedure(s): GIVENS CAPSULE STUDY (N/A)  SURGEON:  Surgeon(s) and Role:    * Selicia [place], MD - Primary  Findings ------------ -Small area of inflammation likely in the proximal jejunum. -Small  polyp in the ileum.  No active bleeding seen.  Recommendations ---------------------------- -Continue current management. -No active bleeding seen during capsule endoscopy.  [name] MD, [place] 11/11/2022, 3:51 PM  Contact #  [phone]

## 2022-11-11 NOTE — Progress Notes (Signed)
[facility] Progress Note   Assessment/ Plan:    AKI: Last Cr in CareEverywhere 07/2022 was 0.88.  Now up to 5.2--> 4.9 in the setting of GI bleed.  Making urine.  Renal US has L sided hydro.              - supportive care             - IVFs and blood, likely can stop IVFs in AM if not more bleeding             - hold Lasix/ K supps/ ACEi             - get CT to eval L hydro--> nonobstructing R renal stones, no L hydro (possibly artifact)             - no indication for RRT, likey will not need, Cr downtrending  - add sodium bicarb for metabolic acidosis   2.  Gi bleed:             - NPO             - blood             - PPI gtt             - GI following, capsule endoscopy   3.  Hyperkalemia:             - better after rx- continue Lokelma, got some today             4.  dCHF:             - close attn to fluids- has swelling but doesn't appear overloaded on top of everything   5.  DM II:             - per primary   6.  Dispo: admitted  Subjective:    Seen in room.  Sleepy.  Pressures better, has some midodrine on board.  Hgb stable.  Cr downtrending   Objective:   BP (!) 118/46   Pulse 77   Temp 98.4 F (36.9 C) (Oral)   Resp 16   Ht '5\' 10"'$  (1.778 m)   Wt (!) 150.2 kg   SpO2 93%   BMI 47.51 kg/m   Intake/Output Summary (Last 24 hours) at 11/11/2022 1443 Last data filed at 11/11/2022 0815 Gross per 24 hour  Intake 3906.31 ml  Output 2525 ml  Net 1381.31 ml   Weight change: 0.513 kg  Physical Exam: Gen:NAD, sitting in chair CVS: RRR Resp: clear Abd: soft, obese Ext:2+ LE edema  Imaging: CT ABDOMEN PELVIS WO CONTRAST  Result Date: 11/10/2022 CLINICAL DATA:  Renal failure.  Hydronephrosis on ultrasound EXAM: CT ABDOMEN AND PELVIS WITHOUT CONTRAST TECHNIQUE: Multidetector CT imaging of the abdomen and pelvis was performed following the standard protocol without IV contrast. RADIATION DOSE REDUCTION: This exam was performed according to the  departmental dose-optimization program which includes automated exposure control, adjustment of the mA and/or kV according to patient size and/or use of iterative reconstruction technique. COMPARISON:  Ultrasound 11/10/2022 earlier.  Old CT scan 2021 July FINDINGS: Lower chest: There is some linear opacity lung bases likely scar or atelectasis. Breathing motion. No pleural effusion. Hepatobiliary: Diffuse fatty liver infiltration with some areas of patchy sparing. Gallbladder is nondilated. Pancreas: Preserved pancreatic parenchyma without obvious mass on this noncontrast exam. Spleen: Spleen is nonenlarged. Adrenals/Urinary Tract: Adrenal glands are preserved. Punctate nonobstructing stones along the lower pole of  the right kidney. No left-sided renal stones. No ureteral stones. Prominent renal sinus fat. No renal collecting system dilatation by CT. Findings by ultrasound may have been technical. Preserved contours of the urinary bladder. Stomach/Bowel: On this non oral contrast exam the large bowel has a normal course and caliber. Mild scattered colonic stool. Normal appendix extends medial to the cecum in the right lower quadrant. The stomach and small bowel are nondilated. Vascular/Lymphatic: Normal caliber aorta and IVC. Mild atherosclerotic calcified plaque along the aorta and branch vessels. No specific abnormal lymph node enlargement seen in the abdomen and pelvis on this noncontrast exam. Reproductive: Prostate is unremarkable. Other: Small left-sided fat inguinal hernia and umbilical hernia. No ascites or free air. Musculoskeletal: Scattered degenerative changes of the spine and pelvis. Particularly involving the right hip. There is a sclerotic lesion along the left iliac crest, unchanged going back to 2018 consistent with a benign lesion such as a bone island. IMPRESSION: Punctate nonobstructing lower pole right-sided renal stones. No left renal or bilateral ureteral stones. No collecting system  dilatation. The findings by ultrasound may have been technical. Fatty liver infiltration. Electronically Signed   By: Jill Side M.D.   On: 11/10/2022 13:42   US RENAL  Result Date: 11/10/2022 CLINICAL DATA:  Acute kidney injury EXAM: RENAL / URINARY TRACT ULTRASOUND COMPLETE COMPARISON:  Renal ultrasound dated January 12, 2022 FINDINGS: Right Kidney: Renal measurements: 12.5 x 5.8 x 5.5 cm = volume: 207.6 mL. Echogenicity within normal limits. Probable moderate left hydronephrosis. Left Kidney: Renal measurements: 11.3 x 5.7 x 5.7 cm = volume: 190.2 mL. Echogenicity within normal limits. No mass or hydronephrosis visualized. Bladder: Appears normal for degree of bladder distention. Prevoid volume is 431.5 mL. Other: Poor visualization of the bilateral kidneys due to patient body habitus IMPRESSION: Probable moderate left hydronephrosis, poor visualization of the left kidney somewhat limits evaluation. Recommend CT for further evaluation. Electronically Signed   By: Yetta Glassman M.D.   On: 11/10/2022 09:05   CT Head Wo Contrast  Result Date: 11/10/2022 CLINICAL DATA:  New onset headaches EXAM: CT HEAD WITHOUT CONTRAST TECHNIQUE: Contiguous axial images were obtained from the base of the skull through the vertex without intravenous contrast. RADIATION DOSE REDUCTION: This exam was performed according to the departmental dose-optimization program which includes automated exposure control, adjustment of the mA and/or kV according to patient size and/or use of iterative reconstruction technique. COMPARISON:  08/15/2022 FINDINGS: Brain: No evidence of acute infarction, hemorrhage, hydrocephalus, extra-axial collection or mass lesion/mass effect. Vascular: No hyperdense vessel or unexpected calcification. Skull: Normal. Negative for fracture or focal lesion. Sinuses/Orbits: No acute finding. Other: None. IMPRESSION: No acute intracranial abnormality noted. Electronically Signed   By: Inez Catalina M.D.   On:  11/10/2022 00:52    Labs: BMET Recent Labs  Lab 11/09/22 2247 11/10/22 0648 11/10/22 1735 11/11/22 0249  NA 142 141 139 141  K 6.6* 5.5* 6.4* 5.3*  CL 109 109 108 109  CO2 19* 18* 20* 18*  GLUCOSE 70 90 146* 207*  BUN 113* 104* 101* 91*  CREATININE 5.27* 4.93* 4.24* 3.28*  CALCIUM 8.2* 7.8* 7.6* 7.5*  PHOS  --   --  6.9*  --    CBC Recent Labs  Lab 11/09/22 2247 11/10/22 0756 11/10/22 1735 11/11/22 0001 11/11/22 0249  WBC 11.1*  --  9.0  --   --   HGB 11.0* 10.8* 10.7* 10.8* 10.1*  HCT 34.0* 33.2* 33.4* 33.1* 31.2*  MCV 96.6  --  96.0  --   --   PLT 238  --  216  --   --     Medications:     Chlorhexidine Gluconate Cloth  6 each Topical Daily   [START ON 11/12/2022] gabapentin  300 mg Oral QHS   hydrocortisone  25 mg Rectal BID   insulin aspart  0-9 Units Subcutaneous Q4H   midodrine  5 mg Oral TID WC   sodium bicarbonate  650 mg Oral BID    Madelon Lips MD 11/11/2022, 2:43 PM

## 2022-11-11 NOTE — Progress Notes (Signed)
[facility] Progress Note  [name] 62 y.o. 1961/09/16  CC: GI bleed   Subjective: Patient seen and examined at bedside.  Noticed fresh blood on the tissue paper after bowel movement.  Sitting comfortably in the chair.  Hemoglobin stable.  Discussed with RN at bedside.  Capsule study currently getting downloaded.  ROS : afebrile, negative for chest pain   Objective: Vital signs in last 24 hours: Vitals:   11/11/22 0900 11/11/22 1005  BP: (!) 113/46 (!) 103/47  Pulse: 71 80  Resp: 15 20  Temp:    SpO2: 97% 96%    Physical Exam: Morbidly obese, not in acute distress.  Sitting comfortably. Abdomen is obese but otherwise soft, nontender, bowel sounds present, no peritoneal signs Mood and affect normal Alert and oriented x 3  Lab Results: Recent Labs    11/10/22 1735 11/11/22 0249  NA 139 141  K 6.4* 5.3*  CL 108 109  CO2 20* 18*  GLUCOSE 146* 207*  BUN 101* 91*  CREATININE 4.24* 3.28*  CALCIUM 7.6* 7.5*  PHOS 6.9*  --    Recent Labs    11/09/22 2247 11/10/22 1735  AST 16  --   ALT 15  --   ALKPHOS 27*  --   BILITOT 0.6  --   PROT 8.3*  --   ALBUMIN 4.0 4.0   Recent Labs    11/09/22 2247 11/10/22 0756 11/10/22 1735 11/11/22 0001 11/11/22 0249  WBC 11.1*  --  9.0  --   --   HGB 11.0*   < > 10.7* 10.8* 10.1*  HCT 34.0*   < > 33.4* 33.1* 31.2*  MCV 96.6  --  96.0  --   --   PLT 238  --  216  --   --    < > = values in this interval not displayed.   Recent Labs    11/09/22 2332  LABPROT 15.5*  INR 1.2      Assessment/Plan: -Recurrent GI bleed on a patient who takes anticoagulation for DVT/PE.  Had EGD and colonoscopy in 2021 as well as in 2022 without revealing any acute bleeding. -History of DVT/PE.  Was on Eliquis. -Acute kidney injury -improving -History of coronary artery disease -Morbid obesity -Sleep apnea   Recommendations ------------------------- -Hemoglobin relatively stable.  Capsule study images currently  getting downloaded. -Start Anusol suppository for possible hemorrhoidal bleeding. -Continue to hold Eliquis for now. -ok to have full liquid diet today. -Monitor H&H.  GI will follow.   [name] MD, [place] 11/11/2022, 10:30 AM  Contact #  [phone]

## 2022-11-11 NOTE — Progress Notes (Signed)
Observed on telemetry that HR down to 30-40 while patient asleep. Assessed patient and found that telemetry leads were misplaced. Adjusted in proper position and NSR with rate in 70's seen. No signs of distress observed or reported by patient.

## 2022-11-11 NOTE — Progress Notes (Addendum)
PROGRESS NOTE  [name]  GQQ:[id number] DOB: 12/26/1960 DOA: 11/09/2022 PCP: Drue Flirt, MD   Brief Narrative: Patient is a 62 year old male with history of morbid obesity, diabetes type 2, OSA, history of PE on Eliquis, CKD being followed by [facility] nephrology who presented with complaint of dark stool, bright red blood per rectum for several days.  On presentation, he was somnolent, hypotensive with systolic blood pressure in the range of 80s.  Lab work showed creatinine of 5.2, potassium of 6.6.  Digital examination revealed blood, FOBT positive.  [facility] consulted.  Planned for  capsule endoscopy.  Nephrology also consulted for severe AKI, started on IV fluids.   Assessment & Plan:  Principal Problem:   GI bleeding Active Problems:   ARF (acute renal failure) (HCC)   Hyperkalemia   DM2 (diabetes mellitus, type 2) (HCC)   Obesity, Class III, BMI 40-49.9 (morbid obesity) (HCC)   Chronic diastolic CHF (congestive heart failure) (HCC)   OSA (obstructive sleep apnea)   Diabetic peripheral neuropathy (HCC)   History of pulmonary embolism   GI bleed:presented with complaint of dark stool, bright red blood per rectum for several days.  Digital rectal exam revealed blood, FOBT positive. [facility] consulted.  Started on Protonix drip.Underwent capsule endoscopy as per GI, results being awaited. No passage of melena or bloody stools last night Hemoglobin stable in the range of 10 this mrng.  Continue to monitor H&H.   AKI: Follows with nephrology at [facility].  On Lasix, ACE inhibitor at home.  Also severely hypotensive on presentation which might have contributed.  Started on IV fluids.  He has history of recurrent AKI.  Her last creatinine was 1.27 as per 10/20/ 23.  Nephrology consulted.  CT abdomen/pelvis showed punctuate nonobstructive lower pole right side renal stone, no hydronephrosis or obstruction. Kidney function improving with IV fluids, continue IV fluids for  today. Patient declined Foley catheter placement  Severe hypotension: Severely hypotensive on presentation.  Blood pressure getting better with IV fluid and midodrine.  Hyperkalemia: Given IV calcium, bicarb, dextrose,lokelma.  EKG did not show any changes.  Potassium of 5.3 this mng,lokelma ordered 1 dose   History of PE: On Eliquis which is currently on hold for suspicion of GI bleed   Diabetic peripheral neuropathy: On gabapentin, started on low-dose   OSA: As needed CPAP at home   Chronic diastolic CHF: On Lasix at home which is on hold.  Currently looks euvolemic   Diabetes type 2: Currently on sliding scale insulin.  Monitor blood sugars.Recent A1c of 9.5   Obesity: BMI of  47        DVT prophylaxis:SCDs Start: 11/10/22 0551     Code Status: Full Code  Family Communication: None at bedside  Patient status:Inpatient  Patient is from :Home  Anticipated discharge IZ:TIWP  Estimated DC date:1-2 days, awaiting capsule endoscopy report, improvement in the kidney function .  BP still soft.  Patient doesnot feel ready to go home yet   Consultants: Nephrology, GI  Procedures: Capsule endoscopy  Antimicrobials:  Anti-infectives (From admission, onward)    None       Subjective: Patient seen and examined at bedside today.  Hemodynamically stable this morning.  Blood pressure better than yesterday.  Making clear urine.  No black or red stools last night.  Objective: Vitals:   11/11/22 0821 11/11/22 0832 11/11/22 0835 11/11/22 0900  BP:  (!) 124/43  (!) 113/46  Pulse:  71 71 71  Resp:  '17 17 15  '$ Temp: 98.4 F (36.9 C)     TempSrc: Oral     SpO2:  97% 97% 97%  Weight:      Height:        Intake/Output Summary (Last 24 hours) at 11/11/2022 1004 Last data filed at 11/11/2022 0815 Gross per 24 hour  Intake 3906.31 ml  Output 2525 ml  Net 1381.31 ml   Filed Weights   11/09/22 1411 11/10/22 1812  Weight: (!) 149.7 kg (!) 150.2 kg     Examination:  General exam: Overall comfortable, not in distress, obese HEENT: PERRL Respiratory system:  no wheezes or crackles  Cardiovascular system: S1 & S2 heard, RRR.  Gastrointestinal system: Abdomen is nondistended, soft and nontender. Central nervous system: Alert and oriented Extremities: No edema, no clubbing ,no cyanosis Skin: No rashes, no ulcers,no icterus     Data Reviewed: I have personally reviewed following labs and imaging studies  CBC: Recent Labs  Lab 11/09/22 2247 11/10/22 0756 11/10/22 1735 11/11/22 0001 11/11/22 0249  WBC 11.1*  --  9.0  --   --   HGB 11.0* 10.8* 10.7* 10.8* 10.1*  HCT 34.0* 33.2* 33.4* 33.1* 31.2*  MCV 96.6  --  96.0  --   --   PLT 238  --  216  --   --    Basic Metabolic Panel: Recent Labs  Lab 11/09/22 2247 11/10/22 0648 11/10/22 1735 11/11/22 0249  NA 142 141 139 141  K 6.6* 5.5* 6.4* 5.3*  CL 109 109 108 109  CO2 19* 18* 20* 18*  GLUCOSE 70 90 146* 207*  BUN 113* 104* 101* 91*  CREATININE 5.27* 4.93* 4.24* 3.28*  CALCIUM 8.2* 7.8* 7.6* 7.5*  PHOS  --   --  6.9*  --      Recent Results (from the past 240 hour(s))  MRSA Next Gen by PCR, Nasal     Status: None   Collection Time: 11/10/22  6:06 PM   Specimen: Nasal Mucosa; Nasal Swab  Result Value Ref Range Status   MRSA by PCR Next Gen NOT DETECTED NOT DETECTED Final    Comment: (NOTE) The GeneXpert MRSA Assay (FDA approved for NASAL specimens only), is one component of a comprehensive MRSA colonization surveillance program. It is not intended to diagnose MRSA infection nor to guide or monitor treatment for MRSA infections. Test performance is not FDA approved in patients less than 84 years old. Performed at [facility], [place] [address], [place], [place] [address]      Radiology Studies: CT ABDOMEN PELVIS WO CONTRAST  Result Date: 11/10/2022 CLINICAL DATA:  Renal failure.  Hydronephrosis on ultrasound EXAM: CT ABDOMEN AND PELVIS  WITHOUT CONTRAST TECHNIQUE: Multidetector CT imaging of the abdomen and pelvis was performed following the standard protocol without IV contrast. RADIATION DOSE REDUCTION: This exam was performed according to the departmental dose-optimization program which includes automated exposure control, adjustment of the mA and/or kV according to patient size and/or use of iterative reconstruction technique. COMPARISON:  Ultrasound 11/10/2022 earlier.  Old CT scan 2021 July FINDINGS: Lower chest: There is some linear opacity lung bases likely scar or atelectasis. Breathing motion. No pleural effusion. Hepatobiliary: Diffuse fatty liver infiltration with some areas of patchy sparing. Gallbladder is nondilated. Pancreas: Preserved pancreatic parenchyma without obvious mass on this noncontrast exam. Spleen: Spleen is nonenlarged. Adrenals/Urinary Tract: Adrenal glands are preserved. Punctate nonobstructing stones along the lower pole of the right kidney. No left-sided renal stones. No  ureteral stones. Prominent renal sinus fat. No renal collecting system dilatation by CT. Findings by ultrasound may have been technical. Preserved contours of the urinary bladder. Stomach/Bowel: On this non oral contrast exam the large bowel has a normal course and caliber. Mild scattered colonic stool. Normal appendix extends medial to the cecum in the right lower quadrant. The stomach and small bowel are nondilated. Vascular/Lymphatic: Normal caliber aorta and IVC. Mild atherosclerotic calcified plaque along the aorta and branch vessels. No specific abnormal lymph node enlargement seen in the abdomen and pelvis on this noncontrast exam. Reproductive: Prostate is unremarkable. Other: Small left-sided fat inguinal hernia and umbilical hernia. No ascites or free air. Musculoskeletal: Scattered degenerative changes of the spine and pelvis. Particularly involving the right hip. There is a sclerotic lesion along the left iliac crest, unchanged going  back to 2018 consistent with a benign lesion such as a bone island. IMPRESSION: Punctate nonobstructing lower pole right-sided renal stones. No left renal or bilateral ureteral stones. No collecting system dilatation. The findings by ultrasound may have been technical. Fatty liver infiltration. Electronically Signed   By: Jill Side M.D.   On: 11/10/2022 13:42   US RENAL  Result Date: 11/10/2022 CLINICAL DATA:  Acute kidney injury EXAM: RENAL / URINARY TRACT ULTRASOUND COMPLETE COMPARISON:  Renal ultrasound dated January 12, 2022 FINDINGS: Right Kidney: Renal measurements: 12.5 x 5.8 x 5.5 cm = volume: 207.6 mL. Echogenicity within normal limits. Probable moderate left hydronephrosis. Left Kidney: Renal measurements: 11.3 x 5.7 x 5.7 cm = volume: 190.2 mL. Echogenicity within normal limits. No mass or hydronephrosis visualized. Bladder: Appears normal for degree of bladder distention. Prevoid volume is 431.5 mL. Other: Poor visualization of the bilateral kidneys due to patient body habitus IMPRESSION: Probable moderate left hydronephrosis, poor visualization of the left kidney somewhat limits evaluation. Recommend CT for further evaluation. Electronically Signed   By: Yetta Glassman M.D.   On: 11/10/2022 09:05   CT Head Wo Contrast  Result Date: 11/10/2022 CLINICAL DATA:  New onset headaches EXAM: CT HEAD WITHOUT CONTRAST TECHNIQUE: Contiguous axial images were obtained from the base of the skull through the vertex without intravenous contrast. RADIATION DOSE REDUCTION: This exam was performed according to the departmental dose-optimization program which includes automated exposure control, adjustment of the mA and/or kV according to patient size and/or use of iterative reconstruction technique. COMPARISON:  08/15/2022 FINDINGS: Brain: No evidence of acute infarction, hemorrhage, hydrocephalus, extra-axial collection or mass lesion/mass effect. Vascular: No hyperdense vessel or unexpected calcification.  Skull: Normal. Negative for fracture or focal lesion. Sinuses/Orbits: No acute finding. Other: None. IMPRESSION: No acute intracranial abnormality noted. Electronically Signed   By: Inez Catalina M.D.   On: 11/10/2022 00:52    Scheduled Meds:  sodium chloride   Intravenous Once   Chlorhexidine Gluconate Cloth  6 each Topical Daily   gabapentin  300 mg Oral BID   insulin aspart  0-9 Units Subcutaneous Q4H   midodrine  5 mg Oral TID WC   Continuous Infusions:  sodium chloride Stopped (11/11/22 0830)   sodium chloride 100 mL/hr at 11/11/22 0829   pantoprazole 8 mg/hr (11/10/22 2138)     LOS: 1 day   Shelly Coss, MD [facility] P1/19/2024, 10:04 AM

## 2022-11-11 NOTE — Inpatient Diabetes Management (Signed)
Inpatient Diabetes Program Recommendations  AACE/ADA: New Consensus Statement on Inpatient Glycemic Control (2015)  Target Ranges:  Prepandial:   less than 140 mg/dL      Peak postprandial:   less than 180 mg/dL (1-2 hours)      Critically ill patients:  140 - 180 mg/dL   Lab Results  Component Value Date   GLUCAP 259 (H) 11/11/2022   HGBA1C 9.5 (H) 11/09/2022    Latest Reference Range & Units 11/11/22 03:37 11/11/22 07:44 11/11/22 11:47  Glucose-Capillary 70 - 99 mg/dL 196 (H) 168 (H) 259 (H)  (H): Data is abnormally high  Diabetes history: DM2 Outpatient Diabetes medications: U-500 insulin tid per sliding scale, Glucotrol 10 mg bid Current orders for Inpatient glycemic control: Novolog 0-9 units q 4 hrs. Correction scale  Inpatient Diabetes Program Recommendations:   Please consider: -Add Semglee 20 units qd (0.15 units/kg x 150.2 kg=22 units)  Patient sees endocrinologist Dr. Peri Jefferson with last office visit 08/11/22.  Will follow while inpatient.  Thank you, Nani Gasser. Leana Springston, RN, MSN, CDE  Diabetes Coordinator Inpatient Glycemic Control Team Team Pager [phone] (8am-5pm) 11/11/2022 2:50 PM

## 2022-11-12 ENCOUNTER — Inpatient Hospital Stay (HOSPITAL_COMMUNITY): Payer: Medicaid Other

## 2022-11-12 DIAGNOSIS — K625 Hemorrhage of anus and rectum: Secondary | ICD-10-CM

## 2022-11-12 LAB — GLUCOSE, CAPILLARY
Glucose-Capillary: 169 mg/dL — ABNORMAL HIGH (ref 70–99)
Glucose-Capillary: 154 mg/dL — ABNORMAL HIGH (ref 70–99)
Glucose-Capillary: 203 mg/dL — ABNORMAL HIGH (ref 70–99)
Glucose-Capillary: 172 mg/dL — ABNORMAL HIGH (ref 70–99)
Glucose-Capillary: 174 mg/dL — ABNORMAL HIGH (ref 70–99)

## 2022-11-12 LAB — CBC
HCT: 32.7 % — ABNORMAL LOW (ref 39.0–52.0)
Hemoglobin: 10.7 g/dL — ABNORMAL LOW (ref 13.0–17.0)
MCH: 31.1 pg (ref 26.0–34.0)
MCHC: 32.7 g/dL (ref 30.0–36.0)
MCV: 95.1 fL (ref 80.0–100.0)
Platelets: 206 10*3/uL (ref 150–400)
RBC: 3.44 MIL/uL — ABNORMAL LOW (ref 4.22–5.81)
RDW: 14.1 % (ref 11.5–15.5)
WBC: 8.1 10*3/uL (ref 4.0–10.5)
nRBC: 0.4 % — ABNORMAL HIGH (ref 0.0–0.2)

## 2022-11-12 LAB — BASIC METABOLIC PANEL
Anion gap: 9 (ref 5–15)
BUN: 51 mg/dL — ABNORMAL HIGH (ref 6–20)
CO2: 22 mmol/L (ref 22–32)
Calcium: 8.1 mg/dL — ABNORMAL LOW (ref 8.9–10.3)
Chloride: 113 mmol/L — ABNORMAL HIGH (ref 98–111)
Creatinine, Ser: 2.06 mg/dL — ABNORMAL HIGH (ref 0.61–1.24)
GFR, Estimated: 36 mL/min — ABNORMAL LOW (ref >60)
Glucose, Bld: 158 mg/dL — ABNORMAL HIGH (ref 70–99)
Potassium: 4.8 mmol/L (ref 3.5–5.1)
Sodium: 144 mmol/L (ref 135–145)

## 2022-11-12 LAB — TROPONIN I (HIGH SENSITIVITY): Troponin I (High Sensitivity): 8 ng/L (ref <18)

## 2022-11-12 LAB — MAGNESIUM: Magnesium: 1.2 mg/dL — ABNORMAL LOW (ref 1.7–2.4)

## 2022-11-12 MED ORDER — OXYCODONE HCL 5 MG PO TABS
10.0000 mg | ORAL_TABLET | ORAL | Status: DC | PRN
  Administered 2022-11-13: 10 mg via ORAL
  Filled 2022-11-12: qty 2

## 2022-11-12 MED ORDER — MAGNESIUM SULFATE 4 GM/100ML IV SOLN: 4.0000 g | Freq: Once | INTRAVENOUS | Status: DC

## 2022-11-12 MED ORDER — MAGNESIUM SULFATE 2 GM/50ML IV SOLN
2.0000 g | Freq: Once | INTRAVENOUS | Status: AC
  Administered 2022-11-12: 2 g via INTRAVENOUS
  Filled 2022-11-12: qty 50

## 2022-11-12 MED ORDER — ISOSORBIDE MONONITRATE ER 30 MG PO TB24
30.0000 mg | ORAL_TABLET | Freq: Every day | ORAL | Status: DC
  Administered 2022-11-12 – 2022-11-16 (×4): 30 mg via ORAL
  Filled 2022-11-12 (×4): qty 1

## 2022-11-12 MED ORDER — PANTOPRAZOLE SODIUM 40 MG PO TBEC
40.0000 mg | DELAYED_RELEASE_TABLET | Freq: Two times a day (BID) | ORAL | Status: DC
  Administered 2022-11-12 – 2022-11-16 (×7): 40 mg via ORAL
  Filled 2022-11-12 (×7): qty 1

## 2022-11-12 MED ORDER — MORPHINE SULFATE (PF) 2 MG/ML IV SOLN
1.0000 mg | Freq: Once | INTRAVENOUS | Status: AC
  Administered 2022-11-12: 1 mg via INTRAVENOUS
  Filled 2022-11-12: qty 1

## 2022-11-12 MED ORDER — TRAZODONE HCL 50 MG PO TABS
50.0000 mg | ORAL_TABLET | Freq: Every day | ORAL | Status: DC
  Administered 2022-11-12 – 2022-11-15 (×4): 50 mg via ORAL
  Filled 2022-11-12 (×4): qty 1

## 2022-11-12 MED ORDER — OXYCODONE HCL 5 MG PO TABS: 10.0000 mg | ORAL_TABLET | ORAL | Status: DC | PRN

## 2022-11-12 MED ORDER — TIZANIDINE HCL 4 MG PO TABS
4.0000 mg | ORAL_TABLET | Freq: Every day | ORAL | Status: DC
  Administered 2022-11-12 – 2022-11-15 (×4): 4 mg via ORAL
  Filled 2022-11-12 (×4): qty 1

## 2022-11-12 MED ORDER — SERTRALINE HCL 50 MG PO TABS
50.0000 mg | ORAL_TABLET | Freq: Every morning | ORAL | Status: DC
  Administered 2022-11-12 – 2022-11-16 (×4): 50 mg via ORAL
  Filled 2022-11-12 (×4): qty 1

## 2022-11-12 MED ORDER — DOCUSATE SODIUM 100 MG PO CAPS
200.0000 mg | ORAL_CAPSULE | Freq: Two times a day (BID) | ORAL | Status: DC
  Administered 2022-11-12 (×2): 200 mg via ORAL
  Administered 2022-11-13: 100 mg via ORAL
  Filled 2022-11-12 (×3): qty 2

## 2022-11-12 MED ORDER — MORPHINE SULFATE (PF) 2 MG/ML IV SOLN
2.0000 mg | INTRAVENOUS | Status: DC | PRN
  Administered 2022-11-16: 2 mg via INTRAVENOUS
  Filled 2022-11-12: qty 1

## 2022-11-12 MED ORDER — FUROSEMIDE 40 MG PO TABS
40.0000 mg | ORAL_TABLET | Freq: Two times a day (BID) | ORAL | Status: DC
  Administered 2022-11-13 – 2022-11-16 (×6): 40 mg via ORAL
  Filled 2022-11-12 (×6): qty 1

## 2022-11-12 MED ORDER — GABAPENTIN 300 MG PO CAPS
300.0000 mg | ORAL_CAPSULE | Freq: Three times a day (TID) | ORAL | Status: DC
  Administered 2022-11-12 (×3): 300 mg via ORAL
  Filled 2022-11-12 (×3): qty 1

## 2022-11-12 MED ORDER — ATORVASTATIN CALCIUM 40 MG PO TABS
40.0000 mg | ORAL_TABLET | Freq: Every day | ORAL | Status: DC
  Administered 2022-11-12 – 2022-11-16 (×4): 40 mg via ORAL
  Filled 2022-11-12 (×4): qty 1

## 2022-11-12 NOTE — Progress Notes (Addendum)
CROSS COVER NOTE  NAME: [name] MRN: [id number] DOB : 09-29-1961    Date of Service   11/12/2022   HPI/Events of Note   Patient endorses 7/10 sharp pain in mid chest.  At bedside, he is alert and oriented x 4.  He is sitting up comfortably in recliner, and does not appear to be in any distress.  He denies palpitation, SOB, nausea, vomiting, or abdominal discomfort.  The patient states that this chest discomfort came on suddenly while resting.  Pain is not replicable when pressure applied to chest wall.  Pain does not worsen with deep breathing or cough, however it does worsen if he leans forward.  EKG completed, interpreted as normal sinus rhythm with no ST segment changes.  Similar to prior EKG.  Patient endorses cardiac history including CAD with stent placement, hypertension, hyperlipidemia.   Interventions/ Plan   EKG Troponin, mag level Morphine       Raenette Rover, DNP, [place]

## 2022-11-12 NOTE — Progress Notes (Signed)
[facility] Progress Note   Assessment/ Plan:    AKI: Last Cr in CareEverywhere 07/2022 was 0.88.  Now up to 5.2--> 4.9 in the setting of GI bleed.  Making urine.  Renal US has L sided hydro.              - supportive care             - stop IVFs today, restart Lasix tomorrow             - hold Lasix/ K supps/ ACEi             - get CT to eval L hydro--> nonobstructing R renal stones, no L hydro (possibly artifact)             - no indication for RRT, likey will not need, Cr downtrending  - add sodium bicarb for metabolic acidosis   2.  Gi bleed:             - NPO             - blood             - PPI gtt             - GI following, capsule endoscopy--> no active bleeding   3.  Hyperkalemia:             - better after rx- continue Lokelma, got some today             4.  dCHF:             - close attn to fluids- has swelling but doesn't appear overloaded on top of everything   5.  DM II:             - per primary   6.  Dispo: admitted.  Nothing further to add.  Will sign off.    Subjective:    Cr downtrending.  UOP better.  Stop IVFs.     Objective:   BP (!) 153/75 (BP Location: Left Wrist)   Pulse 72   Temp 99.1 F (37.3 C) (Axillary)   Resp 14   Ht '5\' 10"'$  (1.778 m)   Wt (!) 150.2 kg   SpO2 97%   BMI 47.51 kg/m   Intake/Output Summary (Last 24 hours) at 11/12/2022 1138 Last data filed at 11/12/2022 0755 Gross per 24 hour  Intake 2697.69 ml  Output 1150 ml  Net 1547.69 ml   Weight change:   Physical Exam: Gen:NAD, sitting in chair CVS: RRR Resp: clear Abd: soft, obese Ext:2+ LE edema  Imaging: DG CHEST PORT 1 VIEW  Result Date: 11/12/2022 CLINICAL DATA:  109323 Chest pain [id number] EXAM: PORTABLE CHEST 1 VIEW COMPARISON:  08/15/2022 FINDINGS: Heart and mediastinal contours are within normal limits. No focal opacities or effusions. No acute bony abnormality. IMPRESSION: No active disease. Electronically Signed   By: Rolm Baptise M.D.   On:  11/12/2022 09:26   CT ABDOMEN PELVIS WO CONTRAST  Result Date: 11/10/2022 CLINICAL DATA:  Renal failure.  Hydronephrosis on ultrasound EXAM: CT ABDOMEN AND PELVIS WITHOUT CONTRAST TECHNIQUE: Multidetector CT imaging of the abdomen and pelvis was performed following the standard protocol without IV contrast. RADIATION DOSE REDUCTION: This exam was performed according to the departmental dose-optimization program which includes automated exposure control, adjustment of the mA and/or kV according to patient size and/or use of iterative reconstruction technique. COMPARISON:  Ultrasound 11/10/2022 earlier.  Old CT scan 2021  July FINDINGS: Lower chest: There is some linear opacity lung bases likely scar or atelectasis. Breathing motion. No pleural effusion. Hepatobiliary: Diffuse fatty liver infiltration with some areas of patchy sparing. Gallbladder is nondilated. Pancreas: Preserved pancreatic parenchyma without obvious mass on this noncontrast exam. Spleen: Spleen is nonenlarged. Adrenals/Urinary Tract: Adrenal glands are preserved. Punctate nonobstructing stones along the lower pole of the right kidney. No left-sided renal stones. No ureteral stones. Prominent renal sinus fat. No renal collecting system dilatation by CT. Findings by ultrasound may have been technical. Preserved contours of the urinary bladder. Stomach/Bowel: On this non oral contrast exam the large bowel has a normal course and caliber. Mild scattered colonic stool. Normal appendix extends medial to the cecum in the right lower quadrant. The stomach and small bowel are nondilated. Vascular/Lymphatic: Normal caliber aorta and IVC. Mild atherosclerotic calcified plaque along the aorta and branch vessels. No specific abnormal lymph node enlargement seen in the abdomen and pelvis on this noncontrast exam. Reproductive: Prostate is unremarkable. Other: Small left-sided fat inguinal hernia and umbilical hernia. No ascites or free air. Musculoskeletal:  Scattered degenerative changes of the spine and pelvis. Particularly involving the right hip. There is a sclerotic lesion along the left iliac crest, unchanged going back to 2018 consistent with a benign lesion such as a bone island. IMPRESSION: Punctate nonobstructing lower pole right-sided renal stones. No left renal or bilateral ureteral stones. No collecting system dilatation. The findings by ultrasound may have been technical. Fatty liver infiltration. Electronically Signed   By: Jill Side M.D.   On: 11/10/2022 13:42    Labs: BMET Recent Labs  Lab 11/09/22 2247 11/10/22 0648 11/10/22 1735 11/11/22 0249 11/12/22 0312  NA 142 141 139 141 144  K 6.6* 5.5* 6.4* 5.3* 4.8  CL 109 109 108 109 113*  CO2 19* 18* 20* 18* 22  GLUCOSE 70 90 146* 207* 158*  BUN 113* 104* 101* 91* 51*  CREATININE 5.27* 4.93* 4.24* 3.28* 2.06*  CALCIUM 8.2* 7.8* 7.6* 7.5* 8.1*  PHOS  --   --  6.9*  --   --    CBC Recent Labs  Lab 11/09/22 2247 11/10/22 0756 11/10/22 1735 11/11/22 0001 11/11/22 0249 11/12/22 0312  WBC 11.1*  --  9.0  --   --  8.1  HGB 11.0*   < > 10.7* 10.8* 10.1* 10.7*  HCT 34.0*   < > 33.4* 33.1* 31.2* 32.7*  MCV 96.6  --  96.0  --   --  95.1  PLT 238  --  216  --   --  206   < > = values in this interval not displayed.    Medications:     atorvastatin  40 mg Oral Daily   Chlorhexidine Gluconate Cloth  6 each Topical Daily   docusate sodium  200 mg Oral BID   gabapentin  300 mg Oral TID   hydrocortisone  25 mg Rectal BID   insulin aspart  0-9 Units Subcutaneous Q4H   isosorbide mononitrate  30 mg Oral Daily   pantoprazole  40 mg Oral BID AC   sertraline  50 mg Oral q morning   sodium bicarbonate  650 mg Oral BID   tiZANidine  4 mg Oral QHS   traZODone  50 mg Oral QHS    Madelon Lips MD 11/12/2022, 11:38 AM

## 2022-11-12 NOTE — Hospital Course (Addendum)
PMH of type II DM, PE on Eliquis, obesity, OSA, neuropathy, chronic HFpEF present to the hospital with complaints of black tarry stool and BRBPR.  Currently being treated for GI bleed, AKI and hypotension.  GI consulted.  Patient had EGD and colonoscopy in 2021 and 2022 without any evidence of active bleeding.  Capsule endoscopy 1/19 small area of inflammation in the proximal jejunum no active bleeding seen anywhere.  No evidence of internal bleeding on the CT abdomen.  Hemoglobin has been stable from 11 on admission to 10.7 right now Anticoagulation currently on hold. Nephrology was consulted for AKI.  Baseline serum creatinine 0.88.  Improving with IV hydration and holding Lasix and ACE inhibitors.  Ultrasound renal also shows left-sided hydronephrosis but CT abdomen ruled out any hydronephrosis. Hypotension currently being corrected with IV fluid and midodrine.  Now hypertensive.

## 2022-11-12 NOTE — Progress Notes (Addendum)
[facility] Progress Note Patient: [name] [id number]:[id number] DOB: May 01, 1961 DOA: 11/09/2022  DOS: the patient was seen and examined on 11/12/2022  Brief hospital course: PMH of type II DM, PE on Eliquis, obesity, OSA, neuropathy, chronic HFpEF present to the hospital with complaints of black tarry stool and BRBPR.  Currently being treated for GI bleed, AKI and hypotension.  GI consulted.  Patient had EGD and colonoscopy in 2021 and 2022 without any evidence of active bleeding.  Capsule endoscopy 1/19 small area of inflammation in the proximal jejunum no active bleeding seen anywhere.  No evidence of internal bleeding on the CT abdomen.  Hemoglobin has been stable from 11 on admission to 10.7 right now Anticoagulation currently on hold. Nephrology was consulted for AKI.  Baseline serum creatinine 0.88.  Improving with IV hydration and holding Lasix and ACE inhibitors.  Ultrasound renal also shows left-sided hydronephrosis but CT abdomen ruled out any hydronephrosis. Hypotension currently being corrected with IV fluid and midodrine.  Now hypertensive. Assessment and Plan: Recurrent GI bleed.  Chronic hemorrhoidal bleed. EGD and colonoscopy in the past have been unrevealing. Capsule endoscopy at this time around is also unrevealing. Most likely this is hemorrhoidal bleed in the setting open need for anticoagulation. Patient continues to report BRBPR but refuses to use Anusol suppository. Finally he currently agrees will monitor. Continue stool softeners.  Chronic anemia. H&H relatively stable. Will monitor.  Acute kidney injury. Reportedly his baseline is around 0.7. Here in our system he actually has serum creatinine around 1.3. On presentation serum creatinine was 5.27.  Currently now improving with IV hydration. Nephrology was consulted. No further workup. Avoid nephrotoxic medications.  Hyperkalemia. Corrected. Monitor.  Hypotension. Likely responsible for patient's  AKI as well. Monitor while the patient is off of IV fluid as well as midodrine.  History of hypertension. Resuming Imdur.  Chest pain. Noncardiac in nature. Monitor. Chest x-ray unremarkable. EKG unremarkable. Troponin negative. Monitor.  History of PE. Patient is on Eliquis. Currently contraindicated due to active GI bleed. Monitor.  OSA. Continue CPAP nightly.  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 Continue gabapentin. Continue sliding scale insulin every 4 for today as the patient will be not eating much.  Obesity Body mass index is 47.51 kg/m.  Placing the pt at higher risk of poor outcomes.  HLD. Continue statin.  Chronic pain syndrome. Resuming home pain medications.  Depression. Resuming Zoloft. Continuing trazodone.  Metabolic acidosis. Was started on bicarb. Will monitor.  Hypomagnesemia. Corrected.   Subjective: Reports BRBPR.  No nausea no vomiting.  Did not use Anusol suppository until this morning.  No fever no chills.  Physical Exam: General: in Mild distress, No Rash Cardiovascular: S1 and S2 Present, No Murmur Respiratory: Good respiratory effort, Bilateral Air entry present. No Crackles, No wheezes Abdomen: Bowel Sound present, No tenderness Extremities: No edema Neuro: Alert and oriented x3, no new focal deficit  Data Reviewed: I have Reviewed nursing notes, Vitals, and Lab results. Since last encounter, pertinent lab results CBC and BMP   . I have ordered test including CBC and BMP  . I have discussed pt's care plan and test results with GI  .   Disposition: Status is: Inpatient Remains inpatient appropriate because: Monitor stability of H&H and resolution of bleeding with suppository.  Will require IV heparin after that before going home on Eliquis  SCDs Start: 11/10/22 0551   Family Communication: No one at bedside Level of care: Telemetry Continue telemetry due  to bleeding  risk. Vitals:   11/12/22 0848 11/12/22 1201 11/12/22 1524 11/12/22 1629  BP:   (!) 174/72   Pulse:   71   Resp:   17   Temp: 99.1 F (37.3 C) (!) 101.3 F (38.5 C)  98.3 F (36.8 C)  TempSrc: Axillary Axillary  Oral  SpO2:   97%   Weight:      Height:         Author: Berle Mull, MD 11/12/2022 5:59 PM  Please look on [email or web address] to find out who is on call.

## 2022-11-12 NOTE — Progress Notes (Signed)
[facility] Gastroenterology Progress Note  [name] 62 y.o. [birth year]  CC: Recurrent GI bleed   Subjective:  Patient seen and examined at bedside.  Denies any overt bleeding.    ROS : Fever 101.3 earlier in the morning.  Also had some chest pain.   Objective: Vital signs in last 24 hours: Vitals:   11/12/22 0848 11/12/22 1201  BP:    Pulse:    Resp:    Temp: 99.1 F (37.3 C) (!) 101.3 F (38.5 C)  SpO2:      Physical Exam:  Morbidly obese, not in acute distress.  Sitting comfortably. Abdomen is obese but otherwise soft, nontender, bowel sounds present, no peritoneal signs Mood and affect normal Alert and oriented x 3   Lab Results: Recent Labs    11/10/22 1735 11/11/22 0249 11/12/22 0312 11/12/22 0714  NA 139 141 144  --   K 6.4* 5.3* 4.8  --   CL 108 109 113*  --   CO2 20* 18* 22  --   GLUCOSE 146* 207* 158*  --   BUN 101* 91* 51*  --   CREATININE 4.24* 3.28* 2.06*  --   CALCIUM 7.6* 7.5* 8.1*  --   MG  --   --   --  1.2*  PHOS 6.9*  --   --   --    Recent Labs    11/09/22 2247 11/10/22 1735  AST 16  --   ALT 15  --   ALKPHOS 27*  --   BILITOT 0.6  --   PROT 8.3*  --   ALBUMIN 4.0 4.0   Recent Labs    11/10/22 1735 11/11/22 0001 11/11/22 0249 11/12/22 0312  WBC 9.0  --   --  8.1  HGB 10.7*   < > 10.1* 10.7*  HCT 33.4*   < > 31.2* 32.7*  MCV 96.0  --   --  95.1  PLT 216  --   --  206   < > = values in this interval not displayed.   Recent Labs    11/09/22 2332  LABPROT 15.5*  INR 1.2      Assessment/Plan: -Recurrent GI bleed on a patient who takes anticoagulation for DVT/PE.  Had EGD and colonoscopy in 2021 as well as in 2022 without revealing any acute bleeding. -History of DVT/PE.  Was on Eliquis. -Acute kidney injury -improving -History of coronary artery disease -Morbid obesity -Sleep apnea  Capsule endoscopy findings  - Small area of inflammation likely in the proximal jejunum. -Small  polyp in the ileum.  No  active bleeding seen.   Recommendations ------------------------- -Capsule endoscopy yesterday showed small area of inflammation and granularity in the proximal small bowel .  Recommend small bowel follow-through for further evaluation but patient would like to hold off on for right now.  He may consider it on Monday if he still remains in the hospital.  Otherwise he will need this study done as an outpatient.  -Okay to resume anticoagulation from GI standpoint.  -GI will follow if patient remains hospitalized  [name] MD, [place] 11/12/2022, 2:21 PM  Contact #  [phone]

## 2022-11-12 NOTE — TOC CM/SW Note (Signed)
Transition of Care [facility]) Screening Note  Patient Details  Name: [name] Date of Birth: 02-Sep-1961  Transition of Care [facility]) CM/SW Contact:    Sherie Don, LCSW Phone Number: 11/12/2022, 2:07 PM  Transition of Care Department [facility]) has reviewed patient and no TOC needs have been identified at this time. We will continue to monitor patient advancement through interdisciplinary progression rounds. If new patient transition needs arise, please place a TOC consult.

## 2022-11-13 ENCOUNTER — Encounter (HOSPITAL_COMMUNITY): Payer: Self-pay | Admitting: Gastroenterology

## 2022-11-13 DIAGNOSIS — K922 Gastrointestinal hemorrhage, unspecified: Secondary | ICD-10-CM | POA: Diagnosis not present

## 2022-11-13 LAB — IRON AND TIBC
Iron: 72 ug/dL (ref 45–182)
Saturation Ratios: 17 % — ABNORMAL LOW (ref 17.9–39.5)
TIBC: 417 ug/dL (ref 250–450)
UIBC: 345 ug/dL

## 2022-11-13 LAB — BASIC METABOLIC PANEL
Anion gap: 8 (ref 5–15)
BUN: 26 mg/dL — ABNORMAL HIGH (ref 6–20)
CO2: 24 mmol/L (ref 22–32)
Calcium: 8.7 mg/dL — ABNORMAL LOW (ref 8.9–10.3)
Chloride: 112 mmol/L — ABNORMAL HIGH (ref 98–111)
Creatinine, Ser: 1.59 mg/dL — ABNORMAL HIGH (ref 0.61–1.24)
GFR, Estimated: 50 mL/min — ABNORMAL LOW (ref >60)
Glucose, Bld: 180 mg/dL — ABNORMAL HIGH (ref 70–99)
Potassium: 4.5 mmol/L (ref 3.5–5.1)
Sodium: 144 mmol/L (ref 135–145)

## 2022-11-13 LAB — GLUCOSE, CAPILLARY
Glucose-Capillary: 175 mg/dL — ABNORMAL HIGH (ref 70–99)
Glucose-Capillary: 196 mg/dL — ABNORMAL HIGH (ref 70–99)
Glucose-Capillary: 199 mg/dL — ABNORMAL HIGH (ref 70–99)
Glucose-Capillary: 215 mg/dL — ABNORMAL HIGH (ref 70–99)
Glucose-Capillary: 268 mg/dL — ABNORMAL HIGH (ref 70–99)
Glucose-Capillary: 321 mg/dL — ABNORMAL HIGH (ref 70–99)

## 2022-11-13 LAB — CBC
HCT: 29.3 % — ABNORMAL LOW (ref 39.0–52.0)
Hemoglobin: 9.7 g/dL — ABNORMAL LOW (ref 13.0–17.0)
MCH: 31.3 pg (ref 26.0–34.0)
MCHC: 33.1 g/dL (ref 30.0–36.0)
MCV: 94.5 fL (ref 80.0–100.0)
Platelets: 183 10*3/uL (ref 150–400)
RBC: 3.1 MIL/uL — ABNORMAL LOW (ref 4.22–5.81)
RDW: 13.8 % (ref 11.5–15.5)
WBC: 9.1 10*3/uL (ref 4.0–10.5)
nRBC: 0 % (ref 0.0–0.2)

## 2022-11-13 LAB — FOLATE: Folate: 7.9 ng/mL (ref >5.9)

## 2022-11-13 LAB — APTT: aPTT: 29 seconds (ref 24–36)

## 2022-11-13 LAB — HEPARIN LEVEL (UNFRACTIONATED)
Heparin Unfractionated: <0.1 IU/mL — ABNORMAL LOW (ref 0.30–0.70)
Heparin Unfractionated: 0.14 IU/mL — ABNORMAL LOW (ref 0.30–0.70)

## 2022-11-13 LAB — MAGNESIUM: Magnesium: 1.5 mg/dL — ABNORMAL LOW (ref 1.7–2.4)

## 2022-11-13 LAB — HEMOGLOBIN AND HEMATOCRIT, BLOOD
HCT: 30.8 % — ABNORMAL LOW (ref 39.0–52.0)
Hemoglobin: 10 g/dL — ABNORMAL LOW (ref 13.0–17.0)

## 2022-11-13 LAB — FERRITIN: Ferritin: 150 ng/mL (ref 24–336)

## 2022-11-13 LAB — VITAMIN B12: Vitamin B-12: 208 pg/mL (ref 180–914)

## 2022-11-13 MED ORDER — HYDRALAZINE HCL 50 MG PO TABS
50.0000 mg | ORAL_TABLET | Freq: Three times a day (TID) | ORAL | Status: DC
  Administered 2022-11-13 – 2022-11-16 (×10): 50 mg via ORAL
  Filled 2022-11-13 (×11): qty 1

## 2022-11-13 MED ORDER — DOCUSATE SODIUM 100 MG PO CAPS
100.0000 mg | ORAL_CAPSULE | Freq: Two times a day (BID) | ORAL | Status: DC
  Administered 2022-11-13 – 2022-11-14 (×3): 100 mg via ORAL
  Filled 2022-11-13 (×3): qty 1

## 2022-11-13 MED ORDER — GABAPENTIN 400 MG PO CAPS
800.0000 mg | ORAL_CAPSULE | Freq: Three times a day (TID) | ORAL | Status: DC
  Administered 2022-11-13 – 2022-11-16 (×10): 800 mg via ORAL
  Filled 2022-11-13 (×10): qty 2

## 2022-11-13 MED ORDER — CARVEDILOL 6.25 MG PO TABS
6.2500 mg | ORAL_TABLET | Freq: Two times a day (BID) | ORAL | Status: DC
  Administered 2022-11-13 – 2022-11-16 (×6): 6.25 mg via ORAL
  Filled 2022-11-13 (×6): qty 1

## 2022-11-13 MED ORDER — SIMETHICONE 80 MG PO CHEW
80.0000 mg | CHEWABLE_TABLET | Freq: Four times a day (QID) | ORAL | Status: DC
  Administered 2022-11-13 – 2022-11-16 (×12): 80 mg via ORAL
  Filled 2022-11-13 (×13): qty 1

## 2022-11-13 MED ORDER — HEPARIN (PORCINE) 25000 UT/250ML-% IV SOLN
1950.0000 [IU]/h | INTRAVENOUS | Status: DC
  Administered 2022-11-13: 1700 [IU]/h via INTRAVENOUS
  Administered 2022-11-14 – 2022-11-15 (×3): 1950 [IU]/h via INTRAVENOUS
  Filled 2022-11-13 (×4): qty 250

## 2022-11-13 MED ORDER — INSULIN ASPART 100 UNIT/ML IJ SOLN
0.0000 [IU] | Freq: Three times a day (TID) | INTRAMUSCULAR | Status: DC
  Administered 2022-11-13: 3 [IU] via SUBCUTANEOUS
  Administered 2022-11-13: 11 [IU] via SUBCUTANEOUS
  Administered 2022-11-14: 5 [IU] via SUBCUTANEOUS
  Administered 2022-11-14: 8 [IU] via SUBCUTANEOUS
  Administered 2022-11-14: 3 [IU] via SUBCUTANEOUS
  Administered 2022-11-15: 5 [IU] via SUBCUTANEOUS
  Administered 2022-11-15 (×2): 3 [IU] via SUBCUTANEOUS
  Administered 2022-11-16 (×2): 5 [IU] via SUBCUTANEOUS

## 2022-11-13 MED ORDER — OXYCODONE HCL 5 MG PO TABS
20.0000 mg | ORAL_TABLET | Freq: Four times a day (QID) | ORAL | Status: DC | PRN
  Administered 2022-11-13 – 2022-11-15 (×4): 20 mg via ORAL
  Filled 2022-11-13 (×5): qty 4

## 2022-11-13 MED ORDER — MAGNESIUM SULFATE 4 GM/100ML IV SOLN
4.0000 g | Freq: Once | INTRAVENOUS | Status: AC
  Administered 2022-11-13: 4 g via INTRAVENOUS
  Filled 2022-11-13: qty 100

## 2022-11-13 MED ORDER — SODIUM BICARBONATE 650 MG PO TABS: 650.0000 mg | ORAL_TABLET | Freq: Every day | ORAL | Status: DC

## 2022-11-13 MED ORDER — INSULIN ASPART 100 UNIT/ML IJ SOLN
0.0000 [IU] | Freq: Every day | INTRAMUSCULAR | Status: DC
  Administered 2022-11-13 – 2022-11-15 (×3): 3 [IU] via SUBCUTANEOUS

## 2022-11-13 NOTE — Progress Notes (Signed)
[facility] Gastroenterology Progress Note  [name] 62 y.o. Jul 26, 1961  CC: Recurrent GI bleed   Subjective:  Patient seen and examined at bedside.  Seeing fresh blood on the tissue paper but no blood in the stool.  ROS : Afebrile, negative for chest pain   Objective: Vital signs in last 24 hours: Vitals:   11/13/22 0629 11/13/22 0838  BP: (!) 173/78   Pulse: 62   Resp: 14   Temp:  98.5 F (36.9 C)  SpO2: 97%     Physical Exam:  Morbidly obese, not in acute distress.  Sitting comfortably. Abdomen is obese but otherwise soft, nontender, bowel sounds present, no peritoneal signs Mood and affect normal Alert and oriented x 3   Lab Results: Recent Labs    11/10/22 1735 11/11/22 0249 11/12/22 0312 11/12/22 0714 11/13/22 0333  NA 139   < > 144  --  144  K 6.4*   < > 4.8  --  4.5  CL 108   < > 113*  --  112*  CO2 20*   < > 22  --  24  GLUCOSE 146*   < > 158*  --  180*  BUN 101*   < > 51*  --  26*  CREATININE 4.24*   < > 2.06*  --  1.59*  CALCIUM 7.6*   < > 8.1*  --  8.7*  MG  --   --   --  1.2* 1.5*  PHOS 6.9*  --   --   --   --    < > = values in this interval not displayed.   Recent Labs    11/10/22 1735  ALBUMIN 4.0   Recent Labs    11/12/22 0312 11/13/22 0333  WBC 8.1 9.1  HGB 10.7* 9.7*  HCT 32.7* 29.3*  MCV 95.1 94.5  PLT 206 183   No results for input(s): "LABPROT", "INR" in the last 72 hours.     Assessment/Plan: -Recurrent GI bleed on a patient who takes anticoagulation for DVT/PE.  Had EGD and colonoscopy in 2021 as well as in 2022 without revealing any acute bleeding. -History of DVT/PE.  Was on Eliquis. -Acute kidney injury -improving -History of coronary artery disease -Morbid obesity -Sleep apnea  Capsule endoscopy findings  - Small area of inflammation likely in the proximal jejunum. -Small  polyp in the ileum.  No active bleeding seen.   Recommendations ------------------------- -Capsule endoscopy  showed small area  of inflammation and granularity in the proximal small bowel .    Recommend small bowel follow-through for further evaluation .  Patient is agreeable to do it tomorrow  -Okay to resume anticoagulation from GI standpoint.  -GI will follow   [name] MD, [place] 11/13/2022, 10:11 AM  Contact #  [phone]

## 2022-11-13 NOTE — Progress Notes (Signed)
ANTICOAGULATION CONSULT NOTE    Pharmacy Consult for IV heparin Indication: pulmonary embolus  Allergies  Allergen Reactions   Coconut Flavor [Flavoring Agent] Hives   Ibuprofen Other (See Comments)    Made gastric ulcers worse   Aleve [Naproxen] Other (See Comments)    DUE TO KIDNEYS   Nsaids Other (See Comments)    Stomach ulcers    Vascepa [Icosapent Ethyl] Nausea And Vomiting and Other (See Comments)    headaches, chest pain, similar to sx of a stroke, hypotension    Other Hives and Rash    Nut Allergy    Patient Measurements: Height: '5\' 10"'$  (177.8 cm) Weight: (!) 150.2 kg (331 lb 2.1 oz) IBW/kg (Calculated) : 73 Heparin Dosing Weight: 109 kg   Vital Signs: Temp: 98.5 F (36.9 C) (01/21 0838) Temp Source: Oral (01/21 0838) BP: 173/78 (01/21 0629) Pulse Rate: 62 (01/21 0629)  Labs: Recent Labs    11/10/22 1735 11/11/22 0001 11/11/22 0249 11/12/22 0312 11/12/22 0714 11/13/22 0333 11/13/22 1241  HGB 10.7*   < > 10.1* 10.7*  --  9.7* 10.0*  HCT 33.4*   < > 31.2* 32.7*  --  29.3* 30.8*  PLT 216  --   --  206  --  183  --   APTT  --   --   --   --   --   --  29  HEPARINUNFRC  --   --   --   --   --   --  <0.10*  CREATININE 4.24*  --  3.28* 2.06*  --  1.59*  --   TROPONINIHS  --   --   --   --  8  --   --    < > = values in this interval not displayed.    Estimated Creatinine Clearance: 73.5 mL/min (A) (by C-G formula based on SCr of 1.59 mg/dL (H)).   Medical History: Past Medical History:  Diagnosis Date   Anemia    Arthritis    Back pain    CAD (coronary artery disease)    a. s/p DES to LAD in 05/2016   Cervical radiculopathy    Chronic diastolic CHF (congestive heart failure) (HCC)    Chronic pain    Deep vein thrombosis ([place]) 01/06/2016   Formatting of this note might be different from the original.  Formatting of this note might be different from the original. Last Assessment & Plan: Management per primary care. Last Assessment & Plan:  Management per primary care.  Formatting of this note might be different from the original. Last Assessment & Plan: Management per primary care. Last Assessment & Plan: Management per primary care.   Depression    DVT (deep venous thrombosis) (HCC)    Hematemesis    Hepatic steatosis    Hyperlipidemia    Hypertension    IBS (irritable bowel syndrome)    Morbid obesity (HCC)    OSA (obstructive sleep apnea)    Pancreatitis    PE (pulmonary thromboembolism) ([place])    Peripheral neuropathy    PUD (peptic ulcer disease)    Pulmonary embolism ([place]) 07/15/2021   Renal disorder    Stroke [facility])    a. ?details unclear - not seen on imaging when he was admitted in 05/2017 for TIA symptoms which were felt due to cervical radiculopathy.   Thoracic aortic ectasia (HCC)    a. 4.3cm ectatic ascending thoracic aorta by CT 06/2017.    Type 2 diabetes mellitus ([place])  Medications:  Medications Prior to Admission  Medication Sig Dispense Refill Last Dose   albuterol (VENTOLIN HFA) 108 (90 Base) MCG/ACT inhaler Inhale 2 puffs into the lungs as needed for wheezing or shortness of breath.   Past Week   atorvastatin (LIPITOR) 40 MG tablet Take 40 mg by mouth daily.   11/09/2022   carvedilol (COREG) 6.25 MG tablet Take 1 tablet (6.25 mg total) by mouth 2 (two) times daily with a meal. 60 tablet 2 11/09/2022 at unk   cyanocobalamin 1000 MCG tablet Take 1,000 mcg by mouth once a week.   11/07/2022   diclofenac (FLECTOR) 1.3 % PTCH Place 1 patch onto the skin as needed (back and knee pain).   unk   ELIQUIS 5 MG TABS tablet TAKE 1 TABLET BY MOUTH TWICE A DAY 60 tablet 2 11/09/2022 at unk   fenofibrate (TRICOR) 145 MG tablet TAKE 1 TABLET (145 MG TOTAL) BY MOUTH DAILY. 90 tablet 3 11/09/2022   furosemide (LASIX) 40 MG tablet Take 40 mg by mouth 2 (two) times daily.   11/09/2022   gabapentin (NEURONTIN) 800 MG tablet Take 800 mg by mouth 3 (three) times daily.      glipiZIDE (GLUCOTROL) 10 MG tablet Take 10 mg by  mouth 2 (two) times daily.  4 11/09/2022   HUMULIN R U-500 KWIKPEN 500 UNIT/ML kwikpen Inject into the skin in the morning, at noon, and at bedtime. Per sliding scale 3 times daily with meals   11/09/2022   isosorbide mononitrate (IMDUR) 30 MG 24 hr tablet TAKE 1 TABLET BY MOUTH DAILY (Patient taking differently: Take 30 mg by mouth daily.) 28 tablet 12 11/09/2022   lisinopril (ZESTRIL) 20 MG tablet Take 20 mg by mouth daily.   11/09/2022   Menthol, Topical Analgesic, (BIOFREEZE EX) Apply 1 application  topically as needed (knee pain).   11/08/2022   nitroGLYCERIN (NITROSTAT) 0.4 MG SL tablet Place 1 tablet (0.4 mg total) under the tongue every 5 (five) minutes as needed for chest pain. 100 tablet 1 unk   ondansetron (ZOFRAN) 4 MG tablet Take 4 mg by mouth as needed for nausea/vomiting, vomiting or nausea.   Past Month   Oxycodone HCl 20 MG TABS Take 20 mg by mouth every 6 (six) hours as needed (pain).   Past Week   pantoprazole (PROTONIX) 40 MG tablet Take 40 mg by mouth daily.   11/09/2022   potassium chloride (MICRO-K) 10 MEQ CR capsule Take 2 capsules (20 mEq total) by mouth 2 (two) times daily. 120 capsule 3 11/09/2022   sertraline (ZOLOFT) 50 MG tablet Take 50 mg by mouth every morning.   11/09/2022   tiZANidine (ZANAFLEX) 4 MG tablet Take 4 mg by mouth at bedtime.   11/09/2022   traZODone (DESYREL) 50 MG tablet Take 1 tablet (50 mg total) by mouth at bedtime. 30 tablet 11 11/09/2022   Vitamin D, Ergocalciferol, (DRISDOL) 1.25 MG (50000 UT) CAPS capsule Take 50,000 Units by mouth every Monday.   11/07/2022   Insulin Pen Needle 31G X 5 MM MISC Use 1 needle daily to inject insulin as prescribed 100 each 2     Assessment: Pharmacy is consulted to start heparin drip on 62 yo male with PMH of PE. Pt was on apixaban PTA, but med was on hold due to GI bleed. Capsule endoscopy showed small area of inflammation and granularity in the proximal small bowel. GI is ok with resuming anticoagulation at this point.    Today, 11/13/22 Baseline HL is <  0.10, indicating apixaban is out of the system. Can use HL to monitor Hgb 10. , plt 183  SCr 1.59 mg/dl, CrCl 73 ml/min  Per consult no bolus     Goal of Therapy:  Heparin level 0.3-0.7 units/ml Monitor platelets by anticoagulation protocol: Yes   Plan:  Per consult no bolus Start heparin at 1700 units/hr Obtain HL 6 hours after start of infusion  Daily CBC while on heparin drip Monitor for signs and symptoms of bleeding   Royetta Asal, PharmD, BCPS 11/13/2022 3:01 PM

## 2022-11-13 NOTE — Progress Notes (Signed)
[facility] Progress Note Patient: [name] ZWC:[id number] DOB: 1961-04-26 DOA: 11/09/2022  DOS: the patient was seen and examined on 11/13/2022  Brief hospital course: PMH of type II DM, PE on Eliquis, obesity, OSA, neuropathy, chronic HFpEF present to the hospital with complaints of black tarry stool and BRBPR.  Currently being treated for GI bleed, AKI and hypotension.  GI consulted.  Patient had EGD and colonoscopy in 2021 and 2022 without any evidence of active bleeding.  Capsule endoscopy 1/19 small area of inflammation in the proximal jejunum no active bleeding seen anywhere.  No evidence of internal bleeding on the CT abdomen.  Hemoglobin has been stable from 11 on admission to 10.7 right now Anticoagulation currently on hold. Nephrology was consulted for AKI.  Baseline serum creatinine 0.88.  Improving with IV hydration and holding Lasix and ACE inhibitors.  Ultrasound renal also shows left-sided hydronephrosis but CT abdomen ruled out any hydronephrosis. Hypotension currently being corrected with IV fluid and midodrine.  Now hypertensive. Assessment and Plan: Recurrent GI bleed.  Chronic hemorrhoidal bleed. EGD and colonoscopy in the past have been unrevealing. Capsule endoscopy at this time around is also unrevealing. Most likely this is hemorrhoidal bleed in the setting of need for anticoagulation. Patient continues to report BRBPR but was refusing to use Anusol suppository. Since compliant with Anusol suppository, frequency of BRBPR has reduced. Hemoglobin dropped down to 9.7 on 1/21.  Will recheck and if remains stable would initiate IV heparin therapy. Continue stool softeners.  Add Gas-X. GI recommended small bowel follow-through although patient wants to hold off on that for now.  Advancing to soft diet.   Chronic nutritional deficiency anemia H&H relatively stable.  Hemoglobin dropped to 9.7 on 1/21.  Will recheck and monitor. At baseline hemoglobin around  10. Has a history of B12 deficiency and relatively low folic acid level. Will check iron levels as well. Will monitor.  Acute kidney injury. Reportedly his baseline is around 0.7. Here in our system he actually has serum creatinine around 1.3. On presentation serum creatinine was 5.27.  Improved with IV hydration. Nephrology was consulted.  No evidence of obstruction on CT abdomen. No further workup. Avoid nephrotoxic medications.  Hyperkalemia. Corrected. Monitor.   Hypotension.  Resolved. Likely responsible for patient's AKI as well.  Now hypotensive.   Hypertensive urgency. Patient was hypotensive on admission and his medications were on hold. Patient reports that he is compliant with his blood pressure medications at home and his blood pressure has been lately normal. Will resume home Coreg, continue home Lasix and Imdur. Add hydralazine.   Chest pain. Noncardiac in nature. Monitor. Chest x-ray unremarkable. EKG unremarkable. Troponin negative. Monitor.   History of PE. Patient is on Eliquis. Currently contraindicated due to active GI bleed. Monitor.   OSA. Continue CPAP nightly.   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 Continue gabapentin. Continue sliding scale insulin every 4 for today as the patient will be not eating much.   Obesity Body mass index is 47.51 kg/m.  Placing the pt at higher risk of poor outcomes.   HLD. Continue statin.   Chronic pain syndrome. Resuming home pain medications.   Depression. Resuming Zoloft. Continuing trazodone.   Metabolic acidosis.  Resolved Was started on bicarb. Will now stop and monitor.   Hypomagnesemia. Currently being corrected.   Subjective: No nausea no vomiting no fever no chills.  No chest pain.  No abdominal pain.  Had a few bowel movements where he  had frank BRBPR although improving.  Physical Exam: General: in Mild distress, No  Rash Cardiovascular: S1 and S2 Present, No Murmur Respiratory: Good respiratory effort, Bilateral Air entry present. No Crackles, No wheezes Abdomen: Bowel Sound present, mild diffuse tenderness Extremities: Bilateral edema Neuro: Alert and oriented x3, no new focal deficit  Data Reviewed: I have Reviewed nursing notes, Vitals, and Lab results. Since last encounter, pertinent lab results CBC and BMP   . I have ordered test including CBC and BMP and iron, folic acid and L45 level  .   Disposition: Status is: Inpatient Remains inpatient appropriate because: Close monitoring for H&H and need for resumption of anticoagulation  SCDs Start: 11/10/22 0551   Family Communication: No one at bedside Level of care: Telemetry Continue telemetry due to hypertension Vitals:   11/13/22 0229 11/13/22 0400 11/13/22 0629 11/13/22 0838  BP: (!) 171/81  (!) 173/78   Pulse: 69 72 62   Resp: '15 13 14   '$ Temp: 98.5 F (36.9 C)   98.5 F (36.9 C)  TempSrc: Oral   Oral  SpO2: 100% 97% 97%   Weight:      Height:         Author: Berle Mull, MD 11/13/2022 10:11 AM  Please look on [email or web address] to find out who is on call.

## 2022-11-14 DIAGNOSIS — K625 Hemorrhage of anus and rectum: Secondary | ICD-10-CM | POA: Diagnosis not present

## 2022-11-14 LAB — BASIC METABOLIC PANEL
Anion gap: 9 (ref 5–15)
BUN: 23 mg/dL — ABNORMAL HIGH (ref 6–20)
CO2: 24 mmol/L (ref 22–32)
Calcium: 9 mg/dL (ref 8.9–10.3)
Chloride: 105 mmol/L (ref 98–111)
Creatinine, Ser: 1.48 mg/dL — ABNORMAL HIGH (ref 0.61–1.24)
GFR, Estimated: 54 mL/min — ABNORMAL LOW (ref >60)
Glucose, Bld: 187 mg/dL — ABNORMAL HIGH (ref 70–99)
Potassium: 4 mmol/L (ref 3.5–5.1)
Sodium: 138 mmol/L (ref 135–145)

## 2022-11-14 LAB — CBC
HCT: 29.4 % — ABNORMAL LOW (ref 39.0–52.0)
Hemoglobin: 9.3 g/dL — ABNORMAL LOW (ref 13.0–17.0)
MCH: 30.4 pg (ref 26.0–34.0)
MCHC: 31.6 g/dL (ref 30.0–36.0)
MCV: 96.1 fL (ref 80.0–100.0)
Platelets: 190 10*3/uL (ref 150–400)
RBC: 3.06 MIL/uL — ABNORMAL LOW (ref 4.22–5.81)
RDW: 13.6 % (ref 11.5–15.5)
WBC: 7.9 10*3/uL (ref 4.0–10.5)
nRBC: 0 % (ref 0.0–0.2)

## 2022-11-14 LAB — GLUCOSE, CAPILLARY
Glucose-Capillary: 194 mg/dL — ABNORMAL HIGH (ref 70–99)
Glucose-Capillary: 195 mg/dL — ABNORMAL HIGH (ref 70–99)
Glucose-Capillary: 251 mg/dL — ABNORMAL HIGH (ref 70–99)
Glucose-Capillary: 228 mg/dL — ABNORMAL HIGH (ref 70–99)
Glucose-Capillary: 291 mg/dL — ABNORMAL HIGH (ref 70–99)

## 2022-11-14 LAB — MAGNESIUM: Magnesium: 2.5 mg/dL — ABNORMAL HIGH (ref 1.7–2.4)

## 2022-11-14 LAB — HEPARIN LEVEL (UNFRACTIONATED): Heparin Unfractionated: 0.52 IU/mL (ref 0.30–0.70)

## 2022-11-14 MED ORDER — DICYCLOMINE HCL 10 MG PO CAPS
10.0000 mg | ORAL_CAPSULE | Freq: Three times a day (TID) | ORAL | Status: DC
  Administered 2022-11-14 – 2022-11-16 (×5): 10 mg via ORAL
  Filled 2022-11-14 (×5): qty 1

## 2022-11-14 MED ORDER — CYANOCOBALAMIN 1000 MCG/ML IJ SOLN
1000.0000 ug | Freq: Once | INTRAMUSCULAR | Status: AC
  Administered 2022-11-14: 1000 ug via SUBCUTANEOUS
  Filled 2022-11-14: qty 1

## 2022-11-14 MED ORDER — B COMPLEX-C PO TABS
1.0000 | ORAL_TABLET | Freq: Every day | ORAL | Status: DC
  Administered 2022-11-14: 1 via ORAL
  Filled 2022-11-14: qty 1

## 2022-11-14 NOTE — Inpatient Diabetes Management (Signed)
Inpatient Diabetes Program Recommendations  AACE/ADA: New Consensus Statement on Inpatient Glycemic Control (2015)  Target Ranges:  Prepandial:   less than 140 mg/dL      Peak postprandial:   less than 180 mg/dL (1-2 hours)      Critically ill patients:  140 - 180 mg/dL    Latest Reference Range & Units 11/13/22 00:07 11/13/22 03:28 11/13/22 08:12 11/13/22 11:50 11/13/22 16:08 11/13/22 21:13  Glucose-Capillary 70 - 99 mg/dL 196 (H)  2 units Novolog  175 (H)  2 units Novolog  199 (H)  2 units Novolog  215 (H)  3 units Novolog  321 (H)  11 units Novolog  268 (H)  3 units Novolog   (H): Data is abnormally high  Latest Reference Range & Units 11/14/22 07:24  Glucose-Capillary 70 - 99 mg/dL 195 (H)  3 units Novolog   (H): Data is abnormally high     Home DM Meds: U-500 insulin TID per sliding scale     Glucotrol 10 mg bid   Current Orders: Novolog Moderate Correction Scale/ SSI (0-15 units) TID AC + HS   MD- Note pt takes the Concentrated Humulin R U500 Insulin at home  Please consider:  1. Start Semglee 8 units QHS (0.05 units/kg)  2. Start Novolog Meal Coverage: Novolog 4 units TID with meals HOLD if pt NPO HOLD if pt eats <50% meals  Can restart home doses of the U500 Insulin when pt discharges home    Patient sees endocrinologist Dr. Peri Jefferson with [facility] Last office visit 08/11/22 Instructions given at that visit: Continue Glipizide '10mg'$  1 tablet po BID Adjust Humulin R U-500 Kwik Pen to 95+5 units at 8am, 100+5 units at 2pm and 90+5 units at 10pm    --Will follow patient during hospitalization--  Wyn Quaker RN, MSN, [place] Diabetes Coordinator Inpatient Glycemic Control Team Team Pager: [phone] (8a-5p)

## 2022-11-14 NOTE — Progress Notes (Signed)
[facility] Progress Note  SUBJECTIVE:   Interval history: [name] was seen and evaluated today at bedside.  Resting comfortably in chair at bedside on my evaluation.  Noted that he has not had a bowel movement in the past 3 days, has not seen any blood per rectum.  He is agreeable to completing small bowel follow-through.  No chest pain or shortness of breath.  Past Medical History:  Diagnosis Date   Anemia    Arthritis    Back pain    CAD (coronary artery disease)    a. s/p DES to LAD in 05/2016   Cervical radiculopathy    Chronic diastolic CHF (congestive heart failure) (HCC)    Chronic pain    Deep vein thrombosis ([place]) 01/06/2016   Formatting of this note might be different from the original.  Formatting of this note might be different from the original. Last Assessment & Plan: Management per primary care. Last Assessment & Plan: Management per primary care.  Formatting of this note might be different from the original. Last Assessment & Plan: Management per primary care. Last Assessment & Plan: Management per primary care.   Depression    DVT (deep venous thrombosis) (HCC)    Hematemesis    Hepatic steatosis    Hyperlipidemia    Hypertension    IBS (irritable bowel syndrome)    Morbid obesity (HCC)    OSA (obstructive sleep apnea)    Pancreatitis    PE (pulmonary thromboembolism) ([place])    Peripheral neuropathy    PUD (peptic ulcer disease)    Pulmonary embolism ([place]) 07/15/2021   Renal disorder    Stroke [facility])    a. ?details unclear - not seen on imaging when he was admitted in 05/2017 for TIA symptoms which were felt due to cervical radiculopathy.   Thoracic aortic ectasia (HCC)    a. 4.3cm ectatic ascending thoracic aorta by CT 06/2017.    Type 2 diabetes mellitus ([place])    Past Surgical History:  Procedure Laterality Date   CARDIAC CATHETERIZATION N/A 05/31/2016   Procedure: Left Heart Cath and Coronary Angiography;  Surgeon: Peter M Martinique, MD;   Location: [place] [facility];  Service: Cardiovascular;  Laterality: N/A;   CARDIAC CATHETERIZATION N/A 05/31/2016   Procedure: Intravascular Pressure Wire/FFR Study;  Surgeon: Peter M Martinique, MD;  Location: [place] [facility];  Service: Cardiovascular;  Laterality: N/A;   CARDIAC CATHETERIZATION N/A 05/31/2016   Procedure: Coronary Stent Intervention;  Surgeon: Peter M Martinique, MD;  Location: [place] [facility];  Service: Cardiovascular;  Laterality: N/A;   COLONOSCOPY N/A 07/17/2021   Procedure: COLONOSCOPY;  Surgeon: Wilford Corner, MD;  Location: [facility];  Service: Endoscopy;  Laterality: N/A;   COLONOSCOPY WITH PROPOFOL N/A 04/29/2020   Procedure: COLONOSCOPY WITH PROPOFOL;  Surgeon: Wilford Corner, MD;  Location: [place];  Service: Endoscopy;  Laterality: N/A;   ESOPHAGOGASTRODUODENOSCOPY N/A 04/29/2020   Procedure: ESOPHAGOGASTRODUODENOSCOPY (EGD);  Surgeon: Wilford Corner, MD;  Location: [place];  Service: Endoscopy;  Laterality: N/A;   ESOPHAGOGASTRODUODENOSCOPY (EGD) WITH PROPOFOL N/A 07/17/2021   Procedure: ESOPHAGOGASTRODUODENOSCOPY (EGD) WITH PROPOFOL;  Surgeon: Wilford Corner, MD;  [facility]: [facility];  Service: Endoscopy;  Laterality: N/A;   GIVENS CAPSULE STUDY N/A 11/10/2022   Procedure: GIVENS CAPSULE STUDY;  Surgeon: [name], MD;  Location: [facility];  Service: Gastroenterology;  Laterality: N/A;   LEFT HEART CATH AND CORONARY ANGIOGRAPHY N/A 12/08/2017   Procedure: LEFT HEART CATH AND CORONARY ANGIOGRAPHY;  Surgeon: Ellyn Hack,  Leonie Green, MD;  Location: [place] [facility];  Service: Cardiovascular;  Laterality: N/A;   LEFT HEART CATHETERIZATION WITH CORONARY ANGIOGRAM N/A 02/03/2014   Procedure: LEFT HEART CATHETERIZATION WITH CORONARY ANGIOGRAM;  Surgeon: Pixie Casino, MD;  Location: [facility] CATH LAB;  Service: Cardiovascular;  Laterality: N/A;   left leg stent      POLYPECTOMY  04/29/2020   Procedure: POLYPECTOMY;  Surgeon: Wilford Corner, MD;   Location: [facility] ENDOSCOPY;  Service: Endoscopy;;   POLYPECTOMY  07/17/2021   Procedure: POLYPECTOMY;  Surgeon: Wilford Corner, MD;  Location: [facility];  Service: Endoscopy;;   Current Facility-Administered Medications  Medication Dose Route Frequency Provider Last Rate Last Admin   acetaminophen (TYLENOL) tablet 650 mg  650 mg Oral Q6H PRN Kristopher Oppenheim, DO       Or   acetaminophen (TYLENOL) suppository 650 mg  650 mg Rectal Q6H PRN Kristopher Oppenheim, DO       atorvastatin (LIPITOR) tablet 40 mg  40 mg Oral Daily Lavina Hamman, MD   40 mg at 11/14/22 1660   B-complex with vitamin C tablet 1 tablet  1 tablet Oral Daily Lavina Hamman, MD   1 tablet at 11/14/22 0837   carvedilol (COREG) tablet 6.25 mg  6.25 mg Oral BID WC Lavina Hamman, MD   6.25 mg at 11/14/22 6301   Chlorhexidine Gluconate Cloth 2 % PADS 6 each  6 each Topical Daily Kristopher Oppenheim, DO   6 each at 11/13/22 6010   dicyclomine (BENTYL) capsule 10 mg  10 mg Oral TID Guadalupe Maple, MD   10 mg at 11/14/22 1227   docusate sodium (COLACE) capsule 100 mg  100 mg Oral BID Lavina Hamman, MD   100 mg at 11/14/22 9323   furosemide (LASIX) tablet 40 mg  40 mg Oral BID Madelon Lips, MD   40 mg at 11/14/22 0825   gabapentin (NEURONTIN) capsule 800 mg  800 mg Oral TID Lavina Hamman, MD   800 mg at 11/14/22 0824   heparin ADULT infusion 100 units/mL (25000 units/257m)  1,950 Units/hr Intravenous Continuous Poindexter, Leann T, RPH 19.5 mL/hr at 11/14/22 0647 1,950 Units/hr at 11/14/22 0647   hydrALAZINE (APRESOLINE) tablet 50 mg  50 mg Oral Q8H PLavina Hamman MD   50 mg at 11/14/22 1344   hydrocortisone (ANUSOL-HC) suppository 25 mg  25 mg Rectal BID Brahmbhatt, Parag, MD   25 mg at 11/12/22 2159   insulin aspart (novoLOG) injection 0-15 Units  0-15 Units Subcutaneous TID WC PLavina Hamman MD   8 Units at 11/14/22 1227   insulin aspart (novoLOG) injection 0-5 Units  0-5 Units Subcutaneous QHS PLavina Hamman MD   3 Units at 11/13/22  2116   isosorbide mononitrate (IMDUR) 24 hr tablet 30 mg  30 mg Oral Daily PLavina Hamman MD   30 mg at 11/14/22 0824   morphine (PF) 2 MG/ML injection 2 mg  2 mg Intravenous Q2H PRN PLavina Hamman MD       ondansetron ([facility] tablet 4 mg  4 mg Oral Q6H PRN CKristopher Oppenheim DO       Or   ondansetron ([facility] injection 4 mg  4 mg Intravenous Q6H PRN CKristopher Oppenheim DO       Oral care mouth rinse  15 mL Mouth Rinse PRN CKristopher Oppenheim DO       oxyCODONE (Oxy IR/ROXICODONE) immediate release tablet 20 mg  20 mg Oral Q6H PRN  Lavina Hamman, MD   20 mg at 11/14/22 1227   pantoprazole (PROTONIX) EC tablet 40 mg  40 mg Oral BID AC Lavina Hamman, MD   40 mg at 11/14/22 0824   sertraline (ZOLOFT) tablet 50 mg  50 mg Oral q morning Lavina Hamman, MD   50 mg at 11/14/22 0824   simethicone (MYLICON) chewable tablet 80 mg  80 mg Oral QID Lavina Hamman, MD   80 mg at 11/14/22 1344   sodium chloride (OCEAN) 0.65 % nasal spray 1 spray  1 spray Each Nare PRN Shelly Coss, MD       tiZANidine (ZANAFLEX) tablet 4 mg  4 mg Oral QHS Lavina Hamman, MD   4 mg at 11/13/22 2115   traZODone (DESYREL) tablet 50 mg  50 mg Oral QHS Lavina Hamman, MD   50 mg at 11/13/22 2114   Allergies as of 11/09/2022 - Review Complete 11/09/2022  Allergen Reaction Noted   Coconut flavor [flavoring agent] Hives 01/26/2017   Ibuprofen Other (See Comments) 01/29/2014   Aleve [naproxen] Other (See Comments) 09/02/2017   Nsaids Other (See Comments) 12/27/2017   Other Hives 01/12/2022   Vascepa [icosapent ethyl] Other (See Comments) 09/30/2019   Review of Systems:  Review of Systems  Respiratory:  Negative for shortness of breath.   Cardiovascular:  Negative for chest pain.  Gastrointestinal:  Negative for abdominal pain, blood in stool, nausea and vomiting.    OBJECTIVE:   Temp:  [97.4 F (36.3 C)-98.6 F (37 C)] 98 F (36.7 C) (01/22 1048) Pulse Rate:  [58-81] 73 (01/22 1048) Resp:  [14-19] 19 (01/22 1048) BP:  (108-166)/(61-76) 111/61 (01/22 1048) SpO2:  [96 %-98 %] 97 % (01/22 1048) Last BM Date : 11/13/22 Physical Exam Constitutional:      General: He is not in acute distress.    Appearance: He is not ill-appearing, toxic-appearing or diaphoretic.  Cardiovascular:     Rate and Rhythm: Normal rate and regular rhythm.  Pulmonary:     Effort: No respiratory distress.     Breath sounds: Normal breath sounds.  Abdominal:     General: Bowel sounds are normal. There is no distension.     Palpations: Abdomen is soft.     Tenderness: There is no abdominal tenderness. There is no guarding.  Musculoskeletal:     Right lower leg: Edema present.     Left lower leg: Edema present.  Neurological:     Mental Status: He is alert.     Labs: Recent Labs    11/12/22 0312 11/13/22 0333 11/13/22 1241 11/14/22 0813  WBC 8.1 9.1  --  7.9  HGB 10.7* 9.7* 10.0* 9.3*  HCT 32.7* 29.3* 30.8* 29.4*  PLT 206 183  --  190   BMET Recent Labs    11/12/22 0312 11/13/22 0333 11/14/22 0813  NA 144 144 138  K 4.8 4.5 4.0  CL 113* 112* 105  CO2 '22 24 24  '$ GLUCOSE 158* 180* 187*  BUN 51* 26* 23*  CREATININE 2.06* 1.59* 1.48*  CALCIUM 8.1* 8.7* 9.0   LFT No results for input(s): "PROT", "ALBUMIN", "AST", "ALT", "ALKPHOS", "BILITOT", "BILIDIR", "IBILI" in the last 72 hours. PT/INR No results for input(s): "LABPROT", "INR" in the last 72 hours. Diagnostic imaging: No results found.  IMPRESSION: Recurrent GI bleeding, occult  -Video capsule endoscopy 11/10/2022 showed small area of inflammation involving the proximal jejunum, small polyp in the ileum, no active bleeding  -Colonoscopy completed 07/17/2021  for hematochezia showed 6 mm transverse polyp removed via hot snare, 5 mm descending colon polyp removed via hot snare, internal hemorrhoids, normal pealing ileum, sigmoid colon diverticulosis  -EGD completed 07/17/2021 showed normal esophagus, linear erosions in gastric antrum, normal duodenum  -Rectal  bleeding has improved with Anusol suppository, likely contributing to clinical picture Normocytic anemia, stable History DVT and pulmonary embolism, previously on Eliquis  -Inpatient heparin has been resumed Acute kidney injury Coronary artery disease, morbid obesity  PLAN: -Patient agreeable to completing small bowel follow-through -Continue oral PPI therapy twice daily -Monitor H/H, transfuse for hemoglobin less than 7 -Trend bowel movements -Okay for anticoagulation from GI standpoint -[facility] will follow   LOS: 4 days   Danton Clap, [facility]

## 2022-11-14 NOTE — Progress Notes (Signed)
ANTICOAGULATION CONSULT NOTE - Follow Up Consult  Pharmacy Consult for heparin Indication: hx pulmonary embolus  Allergies  Allergen Reactions   Coconut Flavor [Flavoring Agent] Hives   Ibuprofen Other (See Comments)    Made gastric ulcers worse   Aleve [Naproxen] Other (See Comments)    DUE TO KIDNEYS   Nsaids Other (See Comments)    Stomach ulcers    Vascepa [Icosapent Ethyl] Nausea And Vomiting and Other (See Comments)    headaches, chest pain, similar to sx of a stroke, hypotension    Other Hives and Rash    Nut Allergy    Patient Measurements: Height: '5\' 10"'$  (177.8 cm) Weight: (!) 150.2 kg (331 lb 2.1 oz) IBW/kg (Calculated) : 73 Heparin Dosing Weight: 109 kg  Vital Signs: Temp: 98.4 F (36.9 C) (01/22 0547) Temp Source: Oral (01/22 0547) BP: 128/75 (01/22 0547) Pulse Rate: 63 (01/22 0547)  Labs: Recent Labs    11/12/22 0312 11/12/22 0714 11/13/22 0333 11/13/22 1241 11/13/22 2236  HGB 10.7*  --  9.7* 10.0*  --   HCT 32.7*  --  29.3* 30.8*  --   PLT 206  --  183  --   --   APTT  --   --   --  29  --   HEPARINUNFRC  --   --   --  <0.10* 0.14*  CREATININE 2.06*  --  1.59*  --   --   TROPONINIHS  --  8  --   --   --     Estimated Creatinine Clearance: 73.5 mL/min (A) (by C-G formula based on SCr of 1.59 mg/dL (H)).   Medications:  - on Eliquis 5 mg bid PTA  Assessment: Patient is a 61 y.o M with hx recurrent GIB and PE on Eliquis PTA who presented to the ED on 11/09/22 with c/o dark tarry stools and BRBPR.  Eliquis held on adm. Endoscopy capsule study showed no active bleeding.  Pharmacy consulted on 11/13/22 to dose heparin drip with no bolus.   Today, 11/14/2022: - heparin level is therapeutic at 0.52 with heparin drip running at 1950 units/hr - hgb down 9.3, plts ok - no new bleeding documented   Goal of Therapy:  Heparin level 0.3-0.7 units/ml Monitor platelets by anticoagulation protocol: Yes   Plan:  - continue heparin drip at 1950 units/hr -  daily heparin level  - monitor for s/sx bleeding   Jeven Topper P 11/14/2022,7:50 AM

## 2022-11-14 NOTE — Progress Notes (Signed)
ANTICOAGULATION CONSULT NOTE    Pharmacy Consult for IV heparin Indication: pulmonary embolus  Allergies  Allergen Reactions   Coconut Flavor [Flavoring Agent] Hives   Ibuprofen Other (See Comments)    Made gastric ulcers worse   Aleve [Naproxen] Other (See Comments)    DUE TO KIDNEYS   Nsaids Other (See Comments)    Stomach ulcers    Vascepa [Icosapent Ethyl] Nausea And Vomiting and Other (See Comments)    headaches, chest pain, similar to sx of a stroke, hypotension    Other Hives and Rash    Nut Allergy    Patient Measurements: Height: '5\' 10"'$  (177.8 cm) Weight: (!) 150.2 kg (331 lb 2.1 oz) IBW/kg (Calculated) : 73 Heparin Dosing Weight: 109 kg   Vital Signs: Temp: 98.6 F (37 C) (01/21 2150) Temp Source: Oral (01/21 2150) BP: 150/76 (01/21 2150) Pulse Rate: 73 (01/21 2150)  Labs: Recent Labs    11/11/22 0249 11/12/22 0312 11/12/22 0714 11/13/22 0333 11/13/22 1241 11/13/22 2236  HGB 10.1* 10.7*  --  9.7* 10.0*  --   HCT 31.2* 32.7*  --  29.3* 30.8*  --   PLT  --  206  --  183  --   --   APTT  --   --   --   --  29  --   HEPARINUNFRC  --   --   --   --  <0.10* 0.14*  CREATININE 3.28* 2.06*  --  1.59*  --   --   TROPONINIHS  --   --  8  --   --   --      Estimated Creatinine Clearance: 73.5 mL/min (A) (by C-G formula based on SCr of 1.59 mg/dL (H)).   Medical History: Past Medical History:  Diagnosis Date   Anemia    Arthritis    Back pain    CAD (coronary artery disease)    a. s/p DES to LAD in 05/2016   Cervical radiculopathy    Chronic diastolic CHF (congestive heart failure) (HCC)    Chronic pain    Deep vein thrombosis ([place]) 01/06/2016   Formatting of this note might be different from the original.  Formatting of this note might be different from the original. Last Assessment & Plan: Management per primary care. Last Assessment & Plan: Management per primary care.  Formatting of this note might be different from the original. Last Assessment &  Plan: Management per primary care. Last Assessment & Plan: Management per primary care.   Depression    DVT (deep venous thrombosis) (HCC)    Hematemesis    Hepatic steatosis    Hyperlipidemia    Hypertension    IBS (irritable bowel syndrome)    Morbid obesity (HCC)    OSA (obstructive sleep apnea)    Pancreatitis    PE (pulmonary thromboembolism) ([place])    Peripheral neuropathy    PUD (peptic ulcer disease)    Pulmonary embolism ([place]) 07/15/2021   Renal disorder    Stroke [facility])    a. ?details unclear - not seen on imaging when he was admitted in 05/2017 for TIA symptoms which were felt due to cervical radiculopathy.   Thoracic aortic ectasia (HCC)    a. 4.3cm ectatic ascending thoracic aorta by CT 06/2017.    Type 2 diabetes mellitus (HCC)     Medications:  Medications Prior to Admission  Medication Sig Dispense Refill Last Dose   albuterol (VENTOLIN HFA) 108 (90 Base) MCG/ACT inhaler Inhale 2  puffs into the lungs as needed for wheezing or shortness of breath.   Past Week   atorvastatin (LIPITOR) 40 MG tablet Take 40 mg by mouth daily.   11/09/2022   carvedilol (COREG) 6.25 MG tablet Take 1 tablet (6.25 mg total) by mouth 2 (two) times daily with a meal. 60 tablet 2 11/09/2022 at unk   cyanocobalamin 1000 MCG tablet Take 1,000 mcg by mouth once a week.   11/07/2022   diclofenac (FLECTOR) 1.3 % PTCH Place 1 patch onto the skin as needed (back and knee pain).   unk   ELIQUIS 5 MG TABS tablet TAKE 1 TABLET BY MOUTH TWICE A DAY 60 tablet 2 11/09/2022 at unk   fenofibrate (TRICOR) 145 MG tablet TAKE 1 TABLET (145 MG TOTAL) BY MOUTH DAILY. 90 tablet 3 11/09/2022   furosemide (LASIX) 40 MG tablet Take 40 mg by mouth 2 (two) times daily.   11/09/2022   gabapentin (NEURONTIN) 800 MG tablet Take 800 mg by mouth 3 (three) times daily.      glipiZIDE (GLUCOTROL) 10 MG tablet Take 10 mg by mouth 2 (two) times daily.  4 11/09/2022   HUMULIN R U-500 KWIKPEN 500 UNIT/ML kwikpen Inject into the skin in the  morning, at noon, and at bedtime. Per sliding scale 3 times daily with meals   11/09/2022   isosorbide mononitrate (IMDUR) 30 MG 24 hr tablet TAKE 1 TABLET BY MOUTH DAILY (Patient taking differently: Take 30 mg by mouth daily.) 28 tablet 12 11/09/2022   lisinopril (ZESTRIL) 20 MG tablet Take 20 mg by mouth daily.   11/09/2022   Menthol, Topical Analgesic, (BIOFREEZE EX) Apply 1 application  topically as needed (knee pain).   11/08/2022   nitroGLYCERIN (NITROSTAT) 0.4 MG SL tablet Place 1 tablet (0.4 mg total) under the tongue every 5 (five) minutes as needed for chest pain. 100 tablet 1 unk   ondansetron (ZOFRAN) 4 MG tablet Take 4 mg by mouth as needed for nausea/vomiting, vomiting or nausea.   Past Month   Oxycodone HCl 20 MG TABS Take 20 mg by mouth every 6 (six) hours as needed (pain).   Past Week   pantoprazole (PROTONIX) 40 MG tablet Take 40 mg by mouth daily.   11/09/2022   potassium chloride (MICRO-K) 10 MEQ CR capsule Take 2 capsules (20 mEq total) by mouth 2 (two) times daily. 120 capsule 3 11/09/2022   sertraline (ZOLOFT) 50 MG tablet Take 50 mg by mouth every morning.   11/09/2022   tiZANidine (ZANAFLEX) 4 MG tablet Take 4 mg by mouth at bedtime.   11/09/2022   traZODone (DESYREL) 50 MG tablet Take 1 tablet (50 mg total) by mouth at bedtime. 30 tablet 11 11/09/2022   Vitamin D, Ergocalciferol, (DRISDOL) 1.25 MG (50000 UT) CAPS capsule Take 50,000 Units by mouth every Monday.   11/07/2022   Insulin Pen Needle 31G X 5 MM MISC Use 1 needle daily to inject insulin as prescribed 100 each 2     Assessment: Pharmacy is consulted to start heparin drip on 62 yo male with PMH of PE. Pt was on apixaban PTA, but med was on hold due to GI bleed. Capsule endoscopy showed small area of inflammation and granularity in the proximal small bowel. GI is ok with resuming anticoagulation at this point.   Today, 11/14/22 Baseline HL is <0.10, indicating apixaban is out of the system. Can use HL to monitor Hgb 10. ,  plt 183  SCr 1.59 mg/dl, CrCl 73 ml/min  Per consult no bolus  Overnight f/u:  heparin level = 0.14 with heparin gtt @ 1700 units/hr No bleeding complications reported by RN   Goal of Therapy:  Heparin level 0.3-0.7 units/ml Monitor platelets by anticoagulation protocol: Yes   Plan:  Per consult no bolus Increase  heparin to 195000 units/hr Obtain HL 6 hours after heparin rate increased Daily CBC while on heparin drip Monitor for signs and symptoms of bleeding   Leone Haven, PharmD 11/14/2022 12:19 AM

## 2022-11-14 NOTE — Progress Notes (Signed)
[facility] Progress Note   Assessment/ Plan:    AKI: Last Cr in CareEverywhere 07/2022 was 0.88.  Now up to 5.2--> 4.9 in the setting of GI bleed.  Making urine.  Renal US had L sided hydro --> f/u CT showed no L hydro (artifact most likely). Lasix/ acei /K supps were held and IVF"s given w/ improvement in creat down to 1.4 today. At d/c would hold po lasix x 4 days then resume po lasix 40 daily and then resume '40mg'$  bid in 8 days or for edema onset. Pt will f/u w/ his renal MD Dr Brent [facility]. No other suggestions, will sign off. 2.  Gi bleed: GI following, capsule endoscopy--> no active bleeding 3.  Hyperkalemia: better after rx 4.  dCHF: resume home lasix as above 5.  DM II: per primary  Kelly Splinter, MD 11/14/2022, 11:32 AM  Recent Labs  Lab 11/09/22 2247 11/10/22 8841 11/10/22 1735 11/11/22 0001 11/13/22 0333 11/13/22 1241 11/14/22 0813  HGB 11.0*   < > 10.7*   < > 9.7* 10.0* 9.3*  ALBUMIN 4.0  --  4.0  --   --   --   --   CALCIUM 8.2*   < > 7.6*   < > 8.7*  --  9.0  PHOS  --   --  6.9*  --   --   --   --   CREATININE 5.27*   < > 4.24*   < > 1.59*  --  1.48*  K 6.6*   < > 6.4*   < > 4.5  --  4.0   < > = values in this interval not displayed.    Inpatient medications:  atorvastatin  40 mg Oral Daily   B-complex with vitamin C  1 tablet Oral Daily   carvedilol  6.25 mg Oral BID WC   Chlorhexidine Gluconate Cloth  6 each Topical Daily   dicyclomine  10 mg Oral TID AC   docusate sodium  100 mg Oral BID   furosemide  40 mg Oral BID   gabapentin  800 mg Oral TID   hydrALAZINE  50 mg Oral Q8H   hydrocortisone  25 mg Rectal BID   insulin aspart  0-15 Units Subcutaneous TID WC   insulin aspart  0-5 Units Subcutaneous QHS   isosorbide mononitrate  30 mg Oral Daily   pantoprazole  40 mg Oral BID AC   sertraline  50 mg Oral q morning   simethicone  80 mg Oral QID   tiZANidine  4 mg Oral QHS   traZODone  50 mg Oral QHS    heparin 1,950 Units/hr (11/14/22 0647)    acetaminophen **OR** acetaminophen, morphine injection, ondansetron **OR** ondansetron (ZOFRAN) IV, mouth rinse, oxyCODONE, sodium chloride       Subjective:    Feeling much better.    Objective:   BP 111/61 (BP Location: Left Wrist)   Pulse 73   Temp 98 F (36.7 C) (Oral)   Resp 19   Ht '5\' 10"'$  (1.778 m)   Wt (!) 150.2 kg   SpO2 97%   BMI 47.51 kg/m   Intake/Output Summary (Last 24 hours) at 11/14/2022 1131 Last data filed at 11/14/2022 1119 Gross per 24 hour  Intake 1446.19 ml  Output 1300 ml  Net 146.19 ml    Weight change:   Physical Exam: Gen:NAD, sitting in chair CVS: RRR Resp: clear Abd: soft, obese Ext:2+ LE edema

## 2022-11-14 NOTE — Progress Notes (Signed)
Mobility Specialist - Progress Note   11/14/22 1534  Mobility  Activity Ambulated independently in hallway  Level of Assistance Independent  Assistive Device None  Distance Ambulated (ft) 250 ft  Activity Response Tolerated well  Mobility Referral Yes  $Mobility charge 1 Mobility   Pt received in recliner and agreeable to mobility. Pt took 1x standing rest bresk (~30sec) due to feeling dizzy. Pt to recliner after session with all needs met.    [facility]

## 2022-11-14 NOTE — Progress Notes (Signed)
[facility] Progress Note Patient: [name] MOL:[id number] DOB: [birth year] DOA: 11/09/2022  DOS: the patient was seen and examined on 11/14/2022  Brief hospital course: PMH of type II DM, PE on Eliquis, obesity, OSA, neuropathy, chronic HFpEF present to the hospital with complaints of black tarry stool and BRBPR.  Currently being treated for GI bleed, AKI and hypotension.  GI consulted.  Patient had EGD and colonoscopy in 2021 and 2022 without any evidence of active bleeding.  Capsule endoscopy 1/19 small area of inflammation in the proximal jejunum no active bleeding seen anywhere.  No evidence of internal bleeding on the CT abdomen.  Hemoglobin has been stable from 11 on admission to 10.7 right now Anticoagulation currently on hold. Nephrology was consulted for AKI.  Baseline serum creatinine 0.88.  Improving with IV hydration and holding Lasix and ACE inhibitors.  Ultrasound renal also shows left-sided hydronephrosis but CT abdomen ruled out any hydronephrosis. Hypotension currently being corrected with IV fluid and midodrine.  Now hypertensive.  Assessment and Plan: Recurrent GI bleed.  Chronic hemorrhoidal bleed. EGD and colonoscopy in the past have been unrevealing. Capsule endoscopy at this time around is also unrevealing. Most likely this is hemorrhoidal bleed in the setting of need for anticoagulation. Patient continues to report BRBPR but was refusing to use Anusol suppository. Since compliant with Anusol suppository, frequency of BRBPR has reduced.  Patient again became noncompliant with suppository, discussed again the importance. Continue stool softeners.  Add Gas-X.  Add Bentyl as well. GI recommended small bowel follow-through although patient wants to hold off on that for now.  Advancing to soft diet. Continue IV heparin.   Chronic nutritional deficiency anemia H&H relatively stable.  Hemoglobin dropped to 9.7 on 1/21.  Will recheck and monitor. At baseline  hemoglobin around 10. Has a history of B12 deficiency and relatively low folic acid level. Iron levels adequate.  Will monitor.  B12 208.  Folic acid 7.9.  Initiate supplements.   Acute kidney injury. Reportedly his baseline is around 0.7. Here in our system he actually has serum creatinine around 1.3. On presentation serum creatinine was 5.27.  Improved with IV hydration. Nephrology was consulted.  No evidence of obstruction on CT abdomen. No further workup. Avoid nephrotoxic medications.   Hyperkalemia. Corrected. Monitor.   Hypotension.  Resolved. Likely responsible for patient's AKI as well.  Now hypotensive.   Hypertensive urgency. Patient was hypotensive on admission and his medications were on hold. Patient reports that he is compliant with his blood pressure medications at home and his blood pressure has been lately normal. Will resume home Coreg, continue home Lasix and Imdur. Add hydralazine.   Chest pain. Noncardiac in nature. Monitor. Chest x-ray unremarkable. EKG unremarkable. Troponin negative. Monitor.   History of PE. Patient is on Eliquis. Currently contraindicated due to active GI bleed. Monitor.   OSA. Continue CPAP nightly.   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 Continue gabapentin. Continue sliding scale insulin every 4 for today as the patient will be not eating much.   Obesity Body mass index is 47.51 kg/m.  Placing the pt at higher risk of poor outcomes.   HLD. Continue statin.   Chronic pain syndrome. Resuming home pain medications.   Depression. Resuming Zoloft. Continuing trazodone.   Metabolic acidosis.  Resolved Was started on bicarb. Will now stop and monitor.   Hypomagnesemia. Currently being corrected.   Subjective: No nausea no vomiting no fever no chills.  Continues to have abdominal  pain.  Had no bowel movement since yesterday.  Passing gas.  Physical  Exam: General: in Mild distress, No Rash Cardiovascular: S1 and S2 Present, No Murmur Respiratory: Good respiratory effort, Bilateral Air entry present. No Crackles, No wheezes Abdomen: Bowel Sound present, No tenderness Extremities: Trace edema Neuro: Alert and oriented x3, no new focal deficit  Data Reviewed: I have Reviewed nursing notes, Vitals, and Lab results. Since last encounter, pertinent lab results CBC and BMP   . I have ordered test including CBC and BMP  .   Disposition: Status is: Inpatient Remains inpatient appropriate because: Continue IV heparin.  Follow-up on small bowel follow-through.  SCDs Start: 11/10/22 0551   Family Communication: No one at bedside Level of care: Telemetry Continue telemetry due to concern for GI bleed. Vitals:   11/13/22 2150 11/14/22 0148 11/14/22 0547 11/14/22 1048  BP: (!) 150/76 108/65 128/75 111/61  Pulse: 73 (!) 58 63 73  Resp: '14 14 14 19  '$ Temp: 98.6 F (37 C) (!) 97.5 F (36.4 C) 98.4 F (36.9 C) 98 F (36.7 C)  TempSrc: Oral Oral Oral Oral  SpO2: 97% 96% 96% 97%  Weight:      Height:         Author: Berle Mull, MD 11/14/2022 5:33 PM  Please look on [email or web address] to find out who is on call.

## 2022-11-15 ENCOUNTER — Inpatient Hospital Stay (HOSPITAL_COMMUNITY): Payer: Medicaid Other

## 2022-11-15 DIAGNOSIS — K625 Hemorrhage of anus and rectum: Secondary | ICD-10-CM | POA: Diagnosis not present

## 2022-11-15 LAB — CBC
HCT: 27.8 % — ABNORMAL LOW (ref 39.0–52.0)
Hemoglobin: 9.4 g/dL — ABNORMAL LOW (ref 13.0–17.0)
MCH: 32 pg (ref 26.0–34.0)
MCHC: 33.8 g/dL (ref 30.0–36.0)
MCV: 94.6 fL (ref 80.0–100.0)
Platelets: 185 10*3/uL (ref 150–400)
RBC: 2.94 MIL/uL — ABNORMAL LOW (ref 4.22–5.81)
RDW: 13.3 % (ref 11.5–15.5)
WBC: 8.1 10*3/uL (ref 4.0–10.5)
nRBC: 0 % (ref 0.0–0.2)

## 2022-11-15 LAB — BASIC METABOLIC PANEL
Anion gap: 11 (ref 5–15)
BUN: 25 mg/dL — ABNORMAL HIGH (ref 6–20)
CO2: 22 mmol/L (ref 22–32)
Calcium: 8.9 mg/dL (ref 8.9–10.3)
Chloride: 102 mmol/L (ref 98–111)
Creatinine, Ser: 1.52 mg/dL — ABNORMAL HIGH (ref 0.61–1.24)
GFR, Estimated: 52 mL/min — ABNORMAL LOW (ref >60)
Glucose, Bld: 232 mg/dL — ABNORMAL HIGH (ref 70–99)
Potassium: 4 mmol/L (ref 3.5–5.1)
Sodium: 135 mmol/L (ref 135–145)

## 2022-11-15 LAB — GLUCOSE, CAPILLARY
Glucose-Capillary: 215 mg/dL — ABNORMAL HIGH (ref 70–99)
Glucose-Capillary: 191 mg/dL — ABNORMAL HIGH (ref 70–99)
Glucose-Capillary: 197 mg/dL — ABNORMAL HIGH (ref 70–99)
Glucose-Capillary: 297 mg/dL — ABNORMAL HIGH (ref 70–99)

## 2022-11-15 LAB — HEPARIN LEVEL (UNFRACTIONATED): Heparin Unfractionated: 0.52 IU/mL (ref 0.30–0.70)

## 2022-11-15 LAB — HEMOGLOBIN AND HEMATOCRIT, BLOOD
HCT: 30.6 % — ABNORMAL LOW (ref 39.0–52.0)
Hemoglobin: 9.9 g/dL — ABNORMAL LOW (ref 13.0–17.0)

## 2022-11-15 LAB — MAGNESIUM: Magnesium: 2.1 mg/dL (ref 1.7–2.4)

## 2022-11-15 MED ORDER — POLYETHYLENE GLYCOL 3350 17 G PO PACK
17.0000 g | PACK | Freq: Every day | ORAL | Status: DC
  Administered 2022-11-15 – 2022-11-16 (×2): 17 g via ORAL
  Filled 2022-11-15 (×2): qty 1

## 2022-11-15 MED ORDER — B COMPLEX-C PO TABS: 1.0000 | ORAL_TABLET | Freq: Every day | ORAL | 0 refills | Status: DC

## 2022-11-15 MED ORDER — SIMETHICONE 80 MG PO CHEW: 80.0000 mg | CHEWABLE_TABLET | Freq: Four times a day (QID) | ORAL | 0 refills | Status: DC

## 2022-11-15 MED ORDER — INSULIN GLARGINE-YFGN 100 UNIT/ML ~~LOC~~ SOLN
18.0000 [IU] | Freq: Every day | SUBCUTANEOUS | Status: DC
  Administered 2022-11-15 – 2022-11-16 (×2): 18 [IU] via SUBCUTANEOUS
  Filled 2022-11-15 (×2): qty 0.18

## 2022-11-15 MED ORDER — HYDROCORTISONE (PERIANAL) 2.5 % EX CREA
TOPICAL_CREAM | Freq: Two times a day (BID) | CUTANEOUS | Status: DC
  Filled 2022-11-15: qty 28.35

## 2022-11-15 MED ORDER — DOCUSATE SODIUM 100 MG PO CAPS: 100.0000 mg | ORAL_CAPSULE | Freq: Two times a day (BID) | ORAL | 0 refills | Status: DC

## 2022-11-15 MED ORDER — DOCUSATE SODIUM 100 MG PO CAPS
200.0000 mg | ORAL_CAPSULE | Freq: Two times a day (BID) | ORAL | Status: DC
  Administered 2022-11-15 – 2022-11-16 (×2): 200 mg via ORAL
  Filled 2022-11-15 (×2): qty 2

## 2022-11-15 MED ORDER — DICYCLOMINE HCL 10 MG PO CAPS: 10.0000 mg | ORAL_CAPSULE | Freq: Three times a day (TID) | ORAL | 0 refills | Status: DC | PRN

## 2022-11-15 MED ORDER — HYDROCORTISONE ACETATE 25 MG RE SUPP: 25.0000 mg | Freq: Two times a day (BID) | RECTAL | 0 refills | Status: DC

## 2022-11-15 MED ORDER — APIXABAN 5 MG PO TABS
5.0000 mg | ORAL_TABLET | Freq: Two times a day (BID) | ORAL | Status: DC
  Administered 2022-11-15 – 2022-11-16 (×3): 5 mg via ORAL
  Filled 2022-11-15 (×3): qty 1

## 2022-11-15 NOTE — Inpatient Diabetes Management (Signed)
Inpatient Diabetes Program Recommendations  AACE/ADA: New Consensus Statement on Inpatient Glycemic Control (2015)  Target Ranges:  Prepandial:   less than 140 mg/dL      Peak postprandial:   less than 180 mg/dL (1-2 hours)      Critically ill patients:  140 - 180 mg/dL   Lab Results  Component Value Date   GLUCAP 215 (H) 11/15/2022   HGBA1C 9.5 (H) 11/09/2022    Review of Glycemic Control  Latest Reference Range & Units 11/14/22 12:13 11/14/22 16:02 11/14/22 21:16 11/15/22 07:43  Glucose-Capillary 70 - 99 mg/dL 251 (H) 228 (H) 291 (H) 215 (H)  (H): Data is abnormally high Home DM Meds: U-500 insulin TID per sliding scale     Glucotrol 10 mg bid    Current Orders: Novolog Moderate Correction Scale/ SSI (0-15 units) TID AC + HS     MD- Note pt takes the Concentrated Humulin R U500 Insulin at home    If to remain inpatient, consider:   1. Semglee 18 units QD   Can restart home doses of the U500 Insulin when pt discharges home  Thanks, Bronson Curb, MSN, RNC-OB Diabetes Coordinator [phone] (8a-5p)

## 2022-11-15 NOTE — Progress Notes (Signed)
[facility] Progress Note  SUBJECTIVE:   Interval history: [name] was seen and evaluated today at bedside.  Resting comfortably in chair after having had small bowel follow-through test completed.  Noted that he has some abdominal discomfort, feels like he needs to have a bowel movement.  No blood per rectum.  Past Medical History:  Diagnosis Date   Anemia    Arthritis    Back pain    CAD (coronary artery disease)    a. s/p DES to LAD in 05/2016   Cervical radiculopathy    Chronic diastolic CHF (congestive heart failure) (HCC)    Chronic pain    Deep vein thrombosis ([place]) 01/06/2016   Formatting of this note might be different from the original.  Formatting of this note might be different from the original. Last Assessment & Plan: Management per primary care. Last Assessment & Plan: Management per primary care.  Formatting of this note might be different from the original. Last Assessment & Plan: Management per primary care. Last Assessment & Plan: Management per primary care.   Depression    DVT (deep venous thrombosis) (HCC)    Hematemesis    Hepatic steatosis    Hyperlipidemia    Hypertension    IBS (irritable bowel syndrome)    Morbid obesity (HCC)    OSA (obstructive sleep apnea)    Pancreatitis    PE (pulmonary thromboembolism) ([place])    Peripheral neuropathy    PUD (peptic ulcer disease)    Pulmonary embolism ([place]) 07/15/2021   Renal disorder    Stroke [facility])    a. ?details unclear - not seen on imaging when he was admitted in 05/2017 for TIA symptoms which were felt due to cervical radiculopathy.   Thoracic aortic ectasia (HCC)    a. 4.3cm ectatic ascending thoracic aorta by CT 06/2017.    Type 2 diabetes mellitus ([place])    Past Surgical History:  Procedure Laterality Date   CARDIAC CATHETERIZATION N/A 05/31/2016   Procedure: Left Heart Cath and Coronary Angiography;  Surgeon: Peter M Martinique, MD;  Location: [place] [facility];  Service: Cardiovascular;   Laterality: N/A;   CARDIAC CATHETERIZATION N/A 05/31/2016   Procedure: Intravascular Pressure Wire/FFR Study;  Surgeon: Peter M Martinique, MD;  Location: [place] [facility];  Service: Cardiovascular;  Laterality: N/A;   CARDIAC CATHETERIZATION N/A 05/31/2016   Procedure: Coronary Stent Intervention;  Surgeon: Peter M Martinique, MD;  Location: [place] [facility];  Service: Cardiovascular;  Laterality: N/A;   COLONOSCOPY N/A 07/17/2021   Procedure: COLONOSCOPY;  Surgeon: Wilford Corner, MD;  Location: [facility];  Service: Endoscopy;  Laterality: N/A;   COLONOSCOPY WITH PROPOFOL N/A 04/29/2020   Procedure: COLONOSCOPY WITH PROPOFOL;  Surgeon: Wilford Corner, MD;  Location: [place];  Service: Endoscopy;  Laterality: N/A;   ESOPHAGOGASTRODUODENOSCOPY N/A 04/29/2020   Procedure: ESOPHAGOGASTRODUODENOSCOPY (EGD);  Surgeon: Wilford Corner, MD;  Location: [place];  Service: Endoscopy;  Laterality: N/A;   ESOPHAGOGASTRODUODENOSCOPY (EGD) WITH PROPOFOL N/A 07/17/2021   Procedure: ESOPHAGOGASTRODUODENOSCOPY (EGD) WITH PROPOFOL;  Surgeon: Wilford Corner, MD;  Location: [facility];  Service: Endoscopy;  Laterality: N/A;   GIVENS CAPSULE STUDY N/A 11/10/2022   Procedure: GIVENS CAPSULE STUDY;  Surgeon: [name], MD;  Location: [facility];  Service: Gastroenterology;  Laterality: N/A;   LEFT HEART CATH AND CORONARY ANGIOGRAPHY N/A 12/08/2017   Procedure: LEFT HEART CATH AND CORONARY ANGIOGRAPHY;  Surgeon: Leonie Man, MD;  Location: [place] [facility];  Service: Cardiovascular;  Laterality:  N/A;   LEFT HEART CATHETERIZATION WITH CORONARY ANGIOGRAM N/A 02/03/2014   Procedure: LEFT HEART CATHETERIZATION WITH CORONARY ANGIOGRAM;  Surgeon: Pixie Casino, MD;  Location[facility] C[facility] LAB;  Service: Cardiovascular;  Laterality: N/A;   left leg stent      POLYPECTOMY  04/29/2020   Procedure: POLYPECTOMY;  Surgeon: Wilford Corner, MD;  Location: [facility] ENDOSCOPY;  Service: Endoscopy;;   POLYPECTOMY   07/17/2021   Procedure: POLYPECTOMY;  Surgeon: Wilford Corner, MD;  Location: [facility];  Service: Endoscopy;;   Current Facility-Administered Medications  Medication Dose Route Frequency Provider Last Rate Last Admin   acetaminophen (TYLENOL) tablet 650 mg  650 mg Oral Q6H PRN Kristopher Oppenheim, DO       Or   acetaminophen (TYLENOL) suppository 650 mg  650 mg Rectal Q6H PRN Kristopher Oppenheim, DO       apixaban Arne Cleveland) tablet 5 mg  5 mg Oral BID Lavina Hamman, MD       atorvastatin (LIPITOR) tablet 40 mg  40 mg Oral Daily Lavina Hamman, MD   40 mg at 11/14/22 3474   B-complex with vitamin C tablet 1 tablet  1 tablet Oral Daily Lavina Hamman, MD   1 tablet at 11/14/22 0837   carvedilol (COREG) tablet 6.25 mg  6.25 mg Oral BID WC Lavina Hamman, MD   6.25 mg at 11/14/22 1617   dicyclomine (BENTYL) capsule 10 mg  10 mg Oral TID Guadalupe Maple, MD   10 mg at 11/14/22 1617   docusate sodium (COLACE) capsule 100 mg  100 mg Oral BID Lavina Hamman, MD   100 mg at 11/14/22 2136   furosemide (LASIX) tablet 40 mg  40 mg Oral BID Madelon Lips, MD   40 mg at 11/14/22 1750   gabapentin (NEURONTIN) capsule 800 mg  800 mg Oral TID Lavina Hamman, MD   800 mg at 11/15/22 1347   hydrALAZINE (APRESOLINE) tablet 50 mg  50 mg Oral Q8H Lavina Hamman, MD   50 mg at 11/15/22 1348   hydrocortisone (ANUSOL-HC) suppository 25 mg  25 mg Rectal BID Brahmbhatt, Parag, MD   25 mg at 11/14/22 2134   insulin aspart (novoLOG) injection 0-15 Units  0-15 Units Subcutaneous TID WC Lavina Hamman, MD   3 Units at 11/15/22 1348   insulin aspart (novoLOG) injection 0-5 Units  0-5 Units Subcutaneous QHS Lavina Hamman, MD   3 Units at 11/14/22 2138   isosorbide mononitrate (IMDUR) 24 hr tablet 30 mg  30 mg Oral Daily Lavina Hamman, MD   30 mg at 11/14/22 0824   morphine (PF) 2 MG/ML injection 2 mg  2 mg Intravenous Q2H PRN Lavina Hamman, MD       ondansetron [facility]) tablet 4 mg  4 mg Oral Q6H PRN Kristopher Oppenheim, DO        Or   ondansetron [facility]) injection 4 mg  4 mg Intravenous Q6H PRN Kristopher Oppenheim, DO       Oral care mouth rinse  15 mL Mouth Rinse PRN Kristopher Oppenheim, DO       oxyCODONE (Oxy IR/ROXICODONE) immediate release tablet 20 mg  20 mg Oral Q6H PRN Lavina Hamman, MD   20 mg at 11/14/22 2136   pantoprazole (PROTONIX) EC tablet 40 mg  40 mg Oral BID AC Lavina Hamman, MD   40 mg at 11/14/22 1617   sertraline (ZOLOFT) tablet 50 mg  50 mg Oral  q morning Lavina Hamman, MD   50 mg at 11/14/22 4944   simethicone (MYLICON) chewable tablet 80 mg  80 mg Oral QID Lavina Hamman, MD   80 mg at 11/15/22 1348   sodium chloride (OCEAN) 0.65 % nasal spray 1 spray  1 spray Each Nare PRN Shelly Coss, MD       tiZANidine (ZANAFLEX) tablet 4 mg  4 mg Oral QHS Lavina Hamman, MD   4 mg at 11/14/22 2136   traZODone (DESYREL) tablet 50 mg  50 mg Oral QHS Lavina Hamman, MD   50 mg at 11/14/22 2136   Allergies as of 11/09/2022 - Review Complete 11/09/2022  Allergen Reaction Noted   Coconut flavor [flavoring agent] Hives 01/26/2017   Ibuprofen Other (See Comments) 01/29/2014   Aleve [naproxen] Other (See Comments) 09/02/2017   Nsaids Other (See Comments) 12/27/2017   Other Hives 01/12/2022   Vascepa [icosapent ethyl] Other (See Comments) 09/30/2019   Review of Systems:  Review of Systems  Gastrointestinal:  Positive for abdominal pain and constipation. Negative for blood in stool.    OBJECTIVE:   Temp:  [97.9 F (36.6 C)-98.3 F (36.8 C)] 97.9 F (36.6 C) (01/23 1323) Pulse Rate:  [60-72] 60 (01/23 1323) Resp:  [18-20] 18 (01/23 1323) BP: (119-174)/(74-84) 174/84 (01/23 1323) SpO2:  [97 %-98 %] 97 % (01/23 1323) Last BM Date : 11/13/22 Physical Exam Constitutional:      General: He is not in acute distress.    Appearance: He is not ill-appearing, toxic-appearing or diaphoretic.  Cardiovascular:     Rate and Rhythm: Normal rate and regular rhythm.  Pulmonary:     Effort: No respiratory distress.      Breath sounds: Normal breath sounds.  Abdominal:     General: Bowel sounds are normal. There is no distension.     Palpations: Abdomen is soft.     Tenderness: There is abdominal tenderness (Epigastric and left upper quadrant). There is no guarding.  Neurological:     Mental Status: He is alert.     Labs: Recent Labs    11/13/22 0333 11/13/22 1241 11/14/22 0813 11/15/22 0612 11/15/22 1341  WBC 9.1  --  7.9 8.1  --   HGB 9.7*   < > 9.3* 9.4* 9.9*  HCT 29.3*   < > 29.4* 27.8* 30.6*  PLT 183  --  190 185  --    < > = values in this interval not displayed.   BMET Recent Labs    11/13/22 0333 11/14/22 0813 11/15/22 0612  NA 144 138 135  K 4.5 4.0 4.0  CL 112* 105 102  CO2 '24 24 22  '$ GLUCOSE 180* 187* 232*  BUN 26* 23* 25*  CREATININE 1.59* 1.48* 1.52*  CALCIUM 8.7* 9.0 8.9   LFT No results for input(s): "PROT", "ALBUMIN", "AST", "ALT", "ALKPHOS", "BILITOT", "BILIDIR", "IBILI" in the last 72 hours. PT/INR No results for input(s): "LABPROT", "INR" in the last 72 hours. Diagnostic imaging: DG SMALL BOWEL W DOUBLE CM (HD)  Result Date: 11/15/2022 CLINICAL DATA:  Provided history: Anemia. Abnormal capsule endoscopy. Additional history obtained from the [place] capsule endoscopy showing a small area of inflammation (likely in the proximal jejunum), small polyp in the ileum. EXAM: SMALL BOWEL SERIES COMPARISON:  CT abdomen/pelvis 11/10/2022. TECHNIQUE: Following ingestion of thin barium, serial small bowel images were obtained including spot views of the terminal ileum. The examination was performed by Rushie Nyhan, NP and was supervised  and interpreted by Dr. Kellie Simmering. FLUOROSCOPY: Fluoroscopy time: 1 minute, 30 seconds (1019.19 mGy). FINDINGS: Prior to the fluoroscopic examination, scout radiographs of the abdomen and pelvis were obtained. Nonobstructive bowel gas pattern. Thoracolumbar spondylosis. Subsequently, the small-bowel follow-through was  performed. The terminal ileum was suboptimally assessed (despite paddle compression) due to patient body habitus and multiple overlying bowel loops. Within this limitation, no definite small-bowel mucosal abnormality was identified. There was no appreciable small bowel stricture. No small bowel dilation. IMPRESSION: Terminal ileum suboptimally assessed, as described. Within this limitation, no small bowel mucosal abnormality or stricture is identified. Specifically, the known area of inflammation in the proximal jejunum, and the known small ileal polyp, are not appreciable by fluoroscopy. Electronically Signed   By: Kellie Simmering D.O.   On: 11/15/2022 14:08    IMPRESSION: Recurrent GI bleeding, occult             -Video capsule endoscopy 11/10/2022 showed small area of inflammation involving the proximal jejunum, small polyp in the ileum, no active bleeding             -Colonoscopy completed 07/17/2021 for hematochezia showed 6 mm transverse polyp removed via hot snare, 5 mm descending colon polyp removed via hot snare, internal hemorrhoids, normal pealing ileum, sigmoid colon diverticulosis             -EGD completed 07/17/2021 showed normal esophagus, linear erosions in gastric antrum, normal duodenum             -Rectal bleeding has improved with Anusol suppository, likely contributing to clinical picture  -Small bowel follow-through testing largely unremarkable on 11/15/2022 Normocytic anemia, stable History DVT and pulmonary embolism, previously on Eliquis             -Inpatient heparin has been resumed Acute kidney injury Coronary artery disease Morbid obesity  PLAN: -No further rectal bleeding, hemoglobin stable, on anticoagulant therapy -Presumed bleeding may be secondary to some hemorrhoidal disease, recommend regular, soft bowel movements with no straining to prevent further hemorrhoidal bleeding -Okay for anticoagulation from GI standpoint -Trend H/H in outpatient setting -Follow-up with  [facility] gastroenterology in the outpatient setting -Appears stable for discharge, discussed with internal medicine   LOS: 5 days   Danton Clap, DO [facility]

## 2022-11-15 NOTE — Progress Notes (Signed)
ANTICOAGULATION CONSULT NOTE - Follow Up Consult  Pharmacy Consult for heparin Indication: hx pulmonary embolus  Allergies  Allergen Reactions   Coconut Flavor [Flavoring Agent] Hives   Ibuprofen Other (See Comments)    Made gastric ulcers worse   Aleve [Naproxen] Other (See Comments)    DUE TO KIDNEYS   Nsaids Other (See Comments)    Stomach ulcers    Vascepa [Icosapent Ethyl] Nausea And Vomiting and Other (See Comments)    headaches, chest pain, similar to sx of a stroke, hypotension    Other Hives and Rash    Nut Allergy    Patient Measurements: Height: '5\' 10"'$  (177.8 cm) Weight: (!) 150.2 kg (331 lb 2.1 oz) IBW/kg (Calculated) : 73 Heparin Dosing Weight: 109 kg  Vital Signs: Temp: 98.3 F (36.8 C) (01/23 0525) Temp Source: Oral (01/23 0525) BP: 119/74 (01/23 0525) Pulse Rate: 70 (01/23 0525)  Labs: Recent Labs    11/13/22 0333 11/13/22 0333 11/13/22 1241 11/13/22 2236 11/14/22 0813 11/15/22 0612  HGB 9.7*  --  10.0*  --  9.3* 9.4*  HCT 29.3*  --  30.8*  --  29.4* 27.8*  PLT 183  --   --   --  190 185  APTT  --   --  29  --   --   --   HEPARINUNFRC  --    < > <0.10* 0.14* 0.52 0.52  CREATININE 1.59*  --   --   --  1.48* 1.52*   < > = values in this interval not displayed.     Estimated Creatinine Clearance: 76.9 mL/min (A) (by C-G formula based on SCr of 1.52 mg/dL (H)).   Medications:  - on Eliquis 5 mg bid PTA  Assessment: Patient is a 62 y.o M with hx recurrent GIB and PE on Eliquis PTA who presented to the ED on 11/09/22 with c/o dark tarry stools and BRBPR.  Eliquis held on adm. Endoscopy capsule study showed no active bleeding.  Pharmacy consulted on 11/13/22 to dose heparin drip with no bolus.   Today, 11/15/2022: - heparin level is therapeutic at 0.52 with heparin drip running at 1950 units/hr - hgb stable at 9.4, plts 185K - no new bleeding documented   Goal of Therapy:  Heparin level 0.3-0.7 units/ml Monitor platelets by anticoagulation  protocol: Yes   Plan:  - continue heparin drip at 1950 units/hr - daily heparin level  - monitor for s/sx bleeding   Bruchy Mikel P 11/15/2022,7:18 AM

## 2022-11-15 NOTE — Progress Notes (Signed)
Mobility Specialist - Progress Note   11/15/22 1604  Mobility  Activity Ambulated independently in hallway  Level of Assistance Independent  Assistive Device None  Distance Ambulated (ft) 265 ft  Activity Response Tolerated well  Mobility Referral Yes  $Mobility charge 1 Mobility   Pt received in recliner and agreeable to mobility. No complaints during session. Pt to recliner after session with all needs met.    [facility]

## 2022-11-15 NOTE — Progress Notes (Signed)
[facility] Progress Note Patient: [name] UEK:[id number] DOB: 1960/12/16 DOA: 11/09/2022  DOS: the patient was seen and examined on 11/15/2022  Brief hospital course: PMH of type II DM, PE on Eliquis, obesity, OSA, neuropathy, chronic HFpEF present to the hospital with complaints of black tarry stool and BRBPR.  Currently being treated for GI bleed, AKI and hypotension.  GI consulted.  Patient had EGD and colonoscopy in 2021 and 2022 without any evidence of active bleeding.  Capsule endoscopy 1/19 small area of inflammation in the proximal jejunum no active bleeding seen anywhere.  No evidence of internal bleeding on the CT abdomen.  Hemoglobin has been stable from 11 on admission to 10.7 right now Anticoagulation currently on hold. Nephrology was consulted for AKI.  Baseline serum creatinine 0.88.  Improving with IV hydration and holding Lasix and ACE inhibitors.  Ultrasound renal also shows left-sided hydronephrosis but CT abdomen ruled out any hydronephrosis. Hypotension currently being corrected with IV fluid and midodrine.  Now hypertensive. Assessment and Plan: Recurrent GI bleed.  Chronic hemorrhoidal bleed. EGD and colonoscopy in the past have been unrevealing. Capsule endoscopy at this time around is also unrevealing. Most likely this is hemorrhoidal bleed in the setting of need for anticoagulation. Patient has compliance issues with Anusol suppository. 1/23 had another episode of BRBPR. I have encouraged the patient to remain compliant with the medical therapy offered to him. I will add Anusol cream along with Anusol suppository and overlap the therapy. Monitor overnight for stability. As long as his H&H remained stable on 1/24 patient will be stable for discharge tomorrow.  Abdominal cramps. Proximal jejunal inflammation on capsule endoscopy. Small bowel follow-through unremarkable. Continue stool softeners.  Add Gas-X.  Add Bentyl as well.   Chronic nutritional  deficiency anemia H&H relatively stable.  Hemoglobin dropped to 9.7 on 1/21.  Will recheck and monitor. At baseline hemoglobin around 10. Has a history of B12 deficiency and relatively low folic acid level. Iron levels adequate.  Will monitor.  B12 208.  Folic acid 7.9.  Initiate supplements.   Acute kidney injury. Reportedly his baseline is around 0.7. Here in our system he actually has serum creatinine around 1.3. On presentation serum creatinine was 5.27.  Improved with IV hydration. Nephrology was consulted.  No evidence of obstruction on CT abdomen. No further workup. Avoid nephrotoxic medications.   Hyperkalemia. Corrected. Monitor.   Hypotension.  Resolved. Likely responsible for patient's AKI as well.  Now hypotensive.   Hypertensive urgency. Patient was hypotensive on admission and his medications were on hold. Patient reports that he is compliant with his blood pressure medications at home and his blood pressure has been lately normal. Will resume home Coreg, continue home Lasix and Imdur. Add hydralazine.   Chest pain. Noncardiac in nature. Monitor. Chest x-ray unremarkable. EKG unremarkable. Troponin negative. Monitor.   History of PE. Patient is on Eliquis.  Was on hold.  Patient tolerated heparin without and drop in H&H.  Currently back on Eliquis.   OSA. Continue CPAP nightly.   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 Remains with hyperglycemia. Continue gabapentin.  Continue sliding scale insulin. Adding long-acting insulin. Patient is on U-500 insulin at home with sliding scale.   Obesity Body mass index is 47.51 kg/m.  Placing the pt at higher risk of poor outcomes.   HLD. Continue statin.   Chronic pain syndrome. Continue home pain medication.   Depression. Resuming Zoloft. Continuing trazodone.   Metabolic acidosis.  Resolved Was started on bicarb.  Now stopped.    Hypomagnesemia. Currently being corrected.   Subjective: Had an episode of bleeding again.  No nausea no vomiting no fever no chills.  No abdominal pain.  Physical Exam: General: in Mild distress, No Rash Cardiovascular: S1 and S2 Present, No Murmur Respiratory: Good respiratory effort, Bilateral Air entry present. No Crackles, No wheezes Abdomen: Bowel Sound present, No tenderness Extremities: No edema Neuro: Alert and oriented x3, no new focal deficit  Data Reviewed: I have Reviewed nursing notes, Vitals, and Lab results. Since last encounter, pertinent lab results CBC and BMP   . I have ordered test including CBC and BMP  . I have discussed pt's care plan and test results with [facility]  .   Disposition: Status is: Inpatient Remains inpatient appropriate because: Monitor for bleeding and stability of H&H  SCDs Start: 11/10/22 0551 apixaban (ELIQUIS) tablet 5 mg   Family Communication: No one at bedside Level of care: Telemetry Continue telemetry as the patient's still has bleeding. Vitals:   11/14/22 1048 11/14/22 1954 11/15/22 0525 11/15/22 1323  BP: 111/61 128/81 119/74 (!) 174/84  Pulse: 73 72 70 60  Resp: '19 20 18 18  '$ Temp: 98 F (36.7 C) 98 F (36.7 C) 98.3 F (36.8 C) 97.9 F (36.6 C)  TempSrc: Oral Oral Oral Oral  SpO2: 97% 98% 98% 97%  Weight:      Height:         Author: Berle Mull, MD 11/15/2022 5:14 PM  Please look on [email or web address] to find out who is on call.

## 2022-11-16 DIAGNOSIS — K922 Gastrointestinal hemorrhage, unspecified: Secondary | ICD-10-CM | POA: Diagnosis not present

## 2022-11-16 LAB — CBC
HCT: 29.7 % — ABNORMAL LOW (ref 39.0–52.0)
Hemoglobin: 9.7 g/dL — ABNORMAL LOW (ref 13.0–17.0)
MCH: 31.1 pg (ref 26.0–34.0)
MCHC: 32.7 g/dL (ref 30.0–36.0)
MCV: 95.2 fL (ref 80.0–100.0)
Platelets: 205 10*3/uL (ref 150–400)
RBC: 3.12 MIL/uL — ABNORMAL LOW (ref 4.22–5.81)
RDW: 13.5 % (ref 11.5–15.5)
WBC: 7.5 10*3/uL (ref 4.0–10.5)
nRBC: 0 % (ref 0.0–0.2)

## 2022-11-16 LAB — GLUCOSE, CAPILLARY
Glucose-Capillary: 215 mg/dL — ABNORMAL HIGH (ref 70–99)
Glucose-Capillary: 247 mg/dL — ABNORMAL HIGH (ref 70–99)

## 2022-11-16 MED ORDER — POLYVINYL ALCOHOL 1.4 % OP SOLN
1.0000 [drp] | OPHTHALMIC | Status: DC | PRN
  Filled 2022-11-16: qty 15

## 2022-11-16 MED ORDER — POLYVINYL ALCOHOL 1.4 % OP SOLN: 1.0000 [drp] | OPHTHALMIC | 0 refills | Status: DC | PRN

## 2022-11-16 MED ORDER — OXYMETAZOLINE HCL 0.05 % NA SOLN: 1.0000 | Freq: Two times a day (BID) | NASAL | 0 refills | Status: DC | PRN

## 2022-11-16 MED ORDER — OXYMETAZOLINE HCL 0.05 % NA SOLN
1.0000 | Freq: Two times a day (BID) | NASAL | Status: DC | PRN
  Filled 2022-11-16: qty 15

## 2022-11-16 MED ORDER — FERROUS SULFATE 325 (65 FE) MG PO TABS: 325.0000 mg | ORAL_TABLET | Freq: Every day | ORAL | 0 refills | Status: AC

## 2022-11-16 MED ORDER — POLYETHYLENE GLYCOL 3350 17 G PO PACK: 17.0000 g | PACK | Freq: Every day | ORAL | 0 refills | Status: DC

## 2022-11-16 MED ORDER — KETOTIFEN FUMARATE 0.035 % OP SOLN
1.0000 [drp] | Freq: Two times a day (BID) | OPHTHALMIC | Status: DC
  Filled 2022-11-16: qty 5

## 2022-11-16 MED ORDER — KETOTIFEN FUMARATE 0.035 % OP SOLN: 1.0000 [drp] | Freq: Two times a day (BID) | OPHTHALMIC | 0 refills | Status: DC

## 2022-11-16 NOTE — Progress Notes (Signed)
Pt discharged home, accompanied by his brother. IVs removed and d/c paperwork reviewed by previous RN [employer]. Hortencia Conradi RN

## 2022-11-16 NOTE — Progress Notes (Signed)
Mobility Specialist - Progress Note   11/16/22 1127  Mobility  Activity Ambulated independently in hallway  Level of Assistance Independent  Assistive Device None  Distance Ambulated (ft) 500 ft  Activity Response Tolerated well  Mobility Referral Yes  $Mobility charge 1 Mobility   Pt received in recliner and agreeable to mobility. No complaints during session. Pt to recliner after session with all needs met.   [facility]

## 2022-11-18 NOTE — Discharge Summary (Signed)
Physician Discharge Summary   Patient: [name] MRN: [id number] DOB: October 26, 1960  Admit date:     11/09/2022  Discharge date: 11/16/2022  Discharge Physician: Berle Mull  PCP: Drue Flirt, MD  Recommendations at discharge: Follow up with PCP in 1 week with CBC and BMP Call Gastroenterology as needed   Follow-up Information     Drue Flirt, MD. Schedule an appointment as soon as possible for a visit in 1 week(s).   Specialty: Family Medicine Why: with CBC and BMP Contact information: [address] [place] [address] [phone]         Gastroenterology, [facility]. Call.   Why: As needed Contact information: [place] [address] [phone]         Ophthalmology. Schedule an appointment as soon as possible for a visit in 1 week(s).                 Discharge Diagnoses: Principal Problem:   GI bleeding Active Problems:   ARF (acute renal failure) (HCC)   Hyperkalemia   DM2 (diabetes mellitus, type 2) (HCC)   Obesity, Class III, BMI 40-49.9 (morbid obesity) (HCC)   Chronic diastolic CHF (congestive heart failure) (HCC)   OSA (obstructive sleep apnea)   Diabetic peripheral neuropathy (HCC)   History of pulmonary embolism   Chest pain, non-cardiac   Depression   Chronic back pain   History of CVA (cerebrovascular accident)   B12 deficiency   Cervical radiculopathy   Benign essential hypertension  Hospital Course: PMH of type II DM, PE on Eliquis, obesity, OSA, neuropathy, chronic HFpEF present to the hospital with complaints of black tarry stool and BRBPR.  Currently being treated for GI bleed, AKI and hypotension.  GI consulted.  Patient had EGD and colonoscopy in 2021 and 2022 without any evidence of active bleeding.  Capsule endoscopy 1/19 small area of inflammation in the proximal jejunum no active bleeding seen anywhere.  No evidence of internal bleeding on the CT abdomen.  Hemoglobin has been  stable Anticoagulation was on hold. Nephrology was consulted for AKI.  Baseline serum creatinine 0.88.  Improving with IV hydration and holding Lasix and ACE inhibitors.  Ultrasound renal also shows left-sided hydronephrosis but CT abdomen ruled out any hydronephrosis. Hypotension corrected with IV fluid and midodrine.  Now hypertensive.  Assessment and Plan  Recurrent GI bleed.  Chronic hemorrhoidal bleed. EGD and colonoscopy in the past have been unrevealing. Capsule endoscopy at this time around is also unrevealing. Most likely this is hemorrhoidal bleed in the setting of need for anticoagulation. Patient has compliance issues with Anusol suppository. 1/23 had another episode of BRBPR. I have encouraged the patient to remain compliant with the medical therapy offered to him. I will add Anusol cream along with Anusol suppository and overlap the therapy. Still has some minor BRBPR but his H&H remained stable despite this on multiple occasions which make pt stable for discharge home with close follow up.    Abdominal cramps. Proximal jejunal inflammation on capsule endoscopy. Small bowel follow-through unremarkable. Continue stool softeners.  Add Gas-X.  Add Bentyl as well.   Chronic nutritional deficiency anemia H&H relatively stable.  Hemoglobin dropped to 9.7 on 1/21.  Will recheck and monitor. At baseline hemoglobin around 10. Has a history of B12 deficiency and relatively low folic acid level. Iron levels adequate.  Will monitor.  B12 208.  Folic acid 7.9.  Initiate supplements.   Acute kidney injury. Reportedly his  baseline is around 0.7. Here in our system he actually has serum creatinine around 1.3. On presentation serum creatinine was 5.27.  Improved with IV hydration. Nephrology was consulted.  No evidence of obstruction on CT abdomen. No further workup. Avoid nephrotoxic medications.   Hyperkalemia. Corrected. Monitor.   Hypotension.  Resolved. Likely responsible  for patient's AKI as well.  Now hypotensive.   Hypertensive urgency. Patient was hypotensive on admission and his medications were on hold. Patient reports that he is compliant with his blood pressure medications at home and his blood pressure has been lately normal. Will resume home Coreg, continue home Lasix and Imdur. Add hydralazine.   Chest pain. Noncardiac in nature. Monitor. Chest x-ray unremarkable. EKG unremarkable. Troponin negative. Monitor.   History of PE. Patient is on Eliquis.  Was on hold.  Patient tolerated heparin without and drop in H&H.  Currently back on Eliquis tolerating it well.    OSA. Continue CPAP nightly.   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 Remains with hyperglycemia. Continue gabapentin. Adding long-acting insulin. Patient is on U-500 insulin at home with sliding scale.   Obesity Body mass index is 47.51 kg/m.  Placing the pt at higher risk of poor outcomes.   HLD. Continue statin.   Chronic pain syndrome. Continue home pain medication.   Depression. Resuming Zoloft. Continuing trazodone.   Metabolic acidosis.  Resolved Was started on bicarb.  Now stopped.   Hypomagnesemia. corrected.   Consultants:  Gastroenterology  Nephrology   Procedures performed:  Capsule endoscopy  DISCHARGE MEDICATION: Allergies as of 11/16/2022       Reactions   Coconut Flavor [flavoring Agent] Hives   Ibuprofen Other (See Comments)   Made gastric ulcers worse   Aleve [naproxen] Other (See Comments)   DUE TO KIDNEYS   Nsaids Other (See Comments)   Stomach ulcers   Vascepa [icosapent Ethyl] Nausea And Vomiting, Other (See Comments)   headaches, chest pain, similar to sx of a stroke, hypotension    Other Hives, Rash   Nut Allergy        Medication List     STOP taking these medications    diclofenac 1.3 % Ptch Commonly known as: FLECTOR   lisinopril 20 MG tablet Commonly  known as: ZESTRIL       TAKE these medications    albuterol 108 (90 Base) MCG/ACT inhaler Commonly known as: VENTOLIN HFA Inhale 2 puffs into the lungs as needed for wheezing or shortness of breath.   atorvastatin 40 MG tablet Commonly known as: LIPITOR Take 40 mg by mouth daily.   B-complex with vitamin C tablet Take 1 tablet by mouth daily.   BIOFREEZE EX Apply 1 application  topically as needed (knee pain).   carvedilol 6.25 MG tablet Commonly known as: COREG Take 1 tablet (6.25 mg total) by mouth 2 (two) times daily with a meal.   cyanocobalamin 1000 MCG tablet Take 1,000 mcg by mouth once a week.   dicyclomine 10 MG capsule Commonly known as: BENTYL Take 1 capsule (10 mg total) by mouth 3 (three) times daily as needed for spasms.   docusate sodium 100 MG capsule Commonly known as: COLACE Take 1 capsule (100 mg total) by mouth 2 (two) times daily.   Eliquis 5 MG Tabs tablet Generic drug: apixaban TAKE 1 TABLET BY MOUTH TWICE A DAY   fenofibrate 145 MG tablet Commonly known as: TRICOR TAKE 1 TABLET (145 MG TOTAL) BY MOUTH DAILY.   ferrous  sulfate 325 (65 FE) MG tablet Take 1 tablet (325 mg total) by mouth daily.   furosemide 40 MG tablet Commonly known as: LASIX Take 40 mg by mouth 2 (two) times daily.   gabapentin 800 MG tablet Commonly known as: NEURONTIN Take 800 mg by mouth 3 (three) times daily.   glipiZIDE 10 MG tablet Commonly known as: GLUCOTROL Take 10 mg by mouth 2 (two) times daily.   HumuLIN R U-500 KwikPen 500 UNIT/ML KwikPen Generic drug: insulin regular human CONCENTRATED Inject into the skin in the morning, at noon, and at bedtime. Per sliding scale 3 times daily with meals   hydrocortisone 25 MG suppository Commonly known as: ANUSOL-HC Place 1 suppository (25 mg total) rectally 2 (two) times daily.   Insulin Pen Needle 31G X 5 MM Misc Use 1 needle daily to inject insulin as prescribed   isosorbide mononitrate 30 MG 24 hr  tablet Commonly known as: IMDUR TAKE 1 TABLET BY MOUTH DAILY   ketotifen 0.035 % ophthalmic solution Commonly known as: ZADITOR Place 1 drop into the right eye 2 (two) times daily for 5 days.   nitroGLYCERIN 0.4 MG SL tablet Commonly known as: NITROSTAT Place 1 tablet (0.4 mg total) under the tongue every 5 (five) minutes as needed for chest pain.   ondansetron 4 MG tablet Commonly known as: ZOFRAN Take 4 mg by mouth as needed for nausea/vomiting, vomiting or nausea.   Oxycodone HCl 20 MG Tabs Take 20 mg by mouth every 6 (six) hours as needed (pain).   oxymetazoline 0.05 % nasal spray Commonly known as: AFRIN Place 1 spray into both nostrils 2 (two) times daily as needed for congestion (nose bleeds).   pantoprazole 40 MG tablet Commonly known as: PROTONIX Take 40 mg by mouth daily.   polyethylene glycol 17 g packet Commonly known as: MIRALAX / GLYCOLAX Take 17 g by mouth daily.   polyvinyl alcohol 1.4 % ophthalmic solution Commonly known as: LIQUIFILM TEARS Place 1 drop into both eyes as needed for dry eyes.   potassium chloride 10 MEQ CR capsule Commonly known as: MICRO-K Take 2 capsules (20 mEq total) by mouth 2 (two) times daily.   sertraline 50 MG tablet Commonly known as: ZOLOFT Take 50 mg by mouth every morning.   simethicone 80 MG chewable tablet Commonly known as: MYLICON Chew 1 tablet (80 mg total) by mouth 4 (four) times daily.   tiZANidine 4 MG tablet Commonly known as: ZANAFLEX Take 4 mg by mouth at bedtime.   traZODone 50 MG tablet Commonly known as: DESYREL Take 1 tablet (50 mg total) by mouth at bedtime.   Vitamin D (Ergocalciferol) 1.25 MG (50000 UNIT) Caps capsule Commonly known as: DRISDOL Take 50,000 Units by mouth every Monday.       Disposition: Home Diet recommendation: Cardiac diet  Discharge Exam: Vitals:   11/15/22 0525 11/15/22 1323 11/15/22 2100 11/16/22 0551  BP: 119/74 (!) 174/84 (!) 153/87 129/67  Pulse: 70 60 84 63   Resp: '18 18 18 18  '$ Temp: 98.3 F (36.8 C) 97.9 F (36.6 C) 98.4 F (36.9 C) 98.2 F (36.8 C)  TempSrc: Oral Oral Oral Oral  SpO2: 98% 97% 97% 98%  Weight:      Height:       General: Appear in no distress; no visible Abnormal Neck Mass Or lumps, Conjunctiva normal Cardiovascular: S1 and S2 Present, no Murmur, Respiratory: good respiratory effort, Bilateral Air entry present and CTA, no Crackles, no wheezes Abdomen: Bowel Sound  present, Non tender  Extremities: no Pedal edema Neurology: alert and oriented to time, place, and person, vision clear, PERRL, EOM normal bilateral, conjunctiva normal, no swelling on eye lids  Filed Weights   11/09/22 1411 11/10/22 1812  Weight: (!) 149.7 kg (!) 150.2 kg   Condition at discharge: stable  The results of significant diagnostics from this hospitalization (including imaging, microbiology, ancillary and laboratory) are listed below for reference.   Imaging Studies: DG SMALL BOWEL W DOUBLE CM (HD)  Result Date: 11/15/2022 CLINICAL DATA:  Provided history: Anemia. Abnormal capsule endoscopy. Additional history obtained from the [facility] capsule endoscopy showing a small area of inflammation (likely in the proximal jejunum), small polyp in the ileum. EXAM: SMALL BOWEL SERIES COMPARISON:  CT abdomen/pelvis 11/10/2022. TECHNIQUE: Following ingestion of thin barium, serial small bowel images were obtained including spot views of the terminal ileum. The examination was performed by Rushie Nyhan, NP and was supervised and interpreted by Dr. Kellie Simmering. FLUOROSCOPY: Fluoroscopy time: 1 minute, 30 seconds (1019.19 mGy). FINDINGS: Prior to the fluoroscopic examination, scout radiographs of the abdomen and pelvis were obtained. Nonobstructive bowel gas pattern. Thoracolumbar spondylosis. Subsequently, the small-bowel follow-through was performed. The terminal ileum was suboptimally assessed (despite paddle compression) due to  patient body habitus and multiple overlying bowel loops. Within this limitation, no definite small-bowel mucosal abnormality was identified. There was no appreciable small bowel stricture. No small bowel dilation. IMPRESSION: Terminal ileum suboptimally assessed, as described. Within this limitation, no small bowel mucosal abnormality or stricture is identified. Specifically, the known area of inflammation in the proximal jejunum, and the known small ileal polyp, are not appreciable by fluoroscopy. Electronically Signed   By: Kellie Simmering D.O.   On: 11/15/2022 14:08   DG CHEST PORT 1 VIEW  Result Date: 11/12/2022 CLINICAL DATA:  [id number] Chest pain [id number] EXAM: PORTABLE CHEST 1 VIEW COMPARISON:  08/15/2022 FINDINGS: Heart and mediastinal contours are within normal limits. No focal opacities or effusions. No acute bony abnormality. IMPRESSION: No active disease. Electronically Signed   By: Rolm Baptise M.D.   On: 11/12/2022 09:26   CT ABDOMEN PELVIS WO CONTRAST  Result Date: 11/10/2022 CLINICAL DATA:  Renal failure.  Hydronephrosis on ultrasound EXAM: CT ABDOMEN AND PELVIS WITHOUT CONTRAST TECHNIQUE: Multidetector CT imaging of the abdomen and pelvis was performed following the standard protocol without IV contrast. RADIATION DOSE REDUCTION: This exam was performed according to the departmental dose-optimization program which includes automated exposure control, adjustment of the mA and/or kV according to patient size and/or use of iterative reconstruction technique. COMPARISON:  Ultrasound 11/10/2022 earlier.  Old CT scan 2021 July FINDINGS: Lower chest: There is some linear opacity lung bases likely scar or atelectasis. Breathing motion. No pleural effusion. Hepatobiliary: Diffuse fatty liver infiltration with some areas of patchy sparing. Gallbladder is nondilated. Pancreas: Preserved pancreatic parenchyma without obvious mass on this noncontrast exam. Spleen: Spleen is nonenlarged. Adrenals/Urinary Tract:  Adrenal glands are preserved. Punctate nonobstructing stones along the lower pole of the right kidney. No left-sided renal stones. No ureteral stones. Prominent renal sinus fat. No renal collecting system dilatation by CT. Findings by ultrasound may have been technical. Preserved contours of the urinary bladder. Stomach/Bowel: On this non oral contrast exam the large bowel has a normal course and caliber. Mild scattered colonic stool. Normal appendix extends medial to the cecum in the right lower quadrant. The stomach and small bowel are nondilated. Vascular/Lymphatic: Normal caliber aorta and IVC. Mild atherosclerotic calcified plaque along the  aorta and branch vessels. No specific abnormal lymph node enlargement seen in the abdomen and pelvis on this noncontrast exam. Reproductive: Prostate is unremarkable. Other: Small left-sided fat inguinal hernia and umbilical hernia. No ascites or free air. Musculoskeletal: Scattered degenerative changes of the spine and pelvis. Particularly involving the right hip. There is a sclerotic lesion along the left iliac crest, unchanged going back to 2018 consistent with a benign lesion such as a bone island. IMPRESSION: Punctate nonobstructing lower pole right-sided renal stones. No left renal or bilateral ureteral stones. No collecting system dilatation. The findings by ultrasound may have been technical. Fatty liver infiltration. Electronically Signed   By: Jill Side M.D.   On: 11/10/2022 13:42   US RENAL  Result Date: 11/10/2022 CLINICAL DATA:  Acute kidney injury EXAM: RENAL / URINARY TRACT ULTRASOUND COMPLETE COMPARISON:  Renal ultrasound dated January 12, 2022 FINDINGS: Right Kidney: Renal measurements: 12.5 x 5.8 x 5.5 cm = volume: 207.6 mL. Echogenicity within normal limits. Probable moderate left hydronephrosis. Left Kidney: Renal measurements: 11.3 x 5.7 x 5.7 cm = volume: 190.2 mL. Echogenicity within normal limits. No mass or hydronephrosis visualized. Bladder:  Appears normal for degree of bladder distention. Prevoid volume is 431.5 mL. Other: Poor visualization of the bilateral kidneys due to patient body habitus IMPRESSION: Probable moderate left hydronephrosis, poor visualization of the left kidney somewhat limits evaluation. Recommend CT for further evaluation. Electronically Signed   By: Yetta Glassman M.D.   On: 11/10/2022 09:05   CT Head Wo Contrast  Result Date: 11/10/2022 CLINICAL DATA:  New onset headaches EXAM: CT HEAD WITHOUT CONTRAST TECHNIQUE: Contiguous axial images were obtained from the base of the skull through the vertex without intravenous contrast. RADIATION DOSE REDUCTION: This exam was performed according to the departmental dose-optimization program which includes automated exposure control, adjustment of the mA and/or kV according to patient size and/or use of iterative reconstruction technique. COMPARISON:  08/15/2022 FINDINGS: Brain: No evidence of acute infarction, hemorrhage, hydrocephalus, extra-axial collection or mass lesion/mass effect. Vascular: No hyperdense vessel or unexpected calcification. Skull: Normal. Negative for fracture or focal lesion. Sinuses/Orbits: No acute finding. Other: None. IMPRESSION: No acute intracranial abnormality noted. Electronically Signed   By: Inez Catalina M.D.   On: 11/10/2022 00:52    Microbiology: Results for orders placed or performed during the hospital encounter of 11/09/22  MRSA Next Gen by PCR, Nasal     Status: None   Collection Time: 11/10/22  6:06 PM   Specimen: Nasal Mucosa; Nasal Swab  Result Value Ref Range Status   MRSA by PCR Next Gen NOT DETECTED NOT DETECTED Final    Comment: (NOTE) The GeneXpert MRSA Assay (FDA approved for NASAL specimens only), is one component of a comprehensive MRSA colonization surveillance program. It is not intended to diagnose MRSA infection nor to guide or monitor treatment for MRSA infections. Test performance is not FDA approved in patients  less than 26 years old. Performed at [facility], [place] [address] [place], [place] [address]    Labs: CBC: Recent Labs  Lab 11/12/22 [phone] 11/13/22 0333 11/13/22 1241 11/14/22 0813 11/15/22 0612 11/15/22 1341 11/16/22 0849  WBC 8.1 9.1  --  7.9 8.1  --  7.5  HGB 10.7* 9.7* 10.0* 9.3* 9.4* 9.9* 9.7*  HCT 32.7* 29.3* 30.8* 29.4* 27.8* 30.6* 29.7*  MCV 95.1 94.5  --  96.1 94.6  --  95.2  PLT 206 183  --  190 185  --  205  Basic Metabolic Panel: Recent Labs  Lab 11/12/22 0312 11/12/22 0714 11/13/22 0333 11/14/22 0813 11/15/22 0612  NA 144  --  144 138 135  K 4.8  --  4.5 4.0 4.0  CL 113*  --  112* 105 102  CO2 22  --  '24 24 22  '$ GLUCOSE 158*  --  180* 187* 232*  BUN 51*  --  26* 23* 25*  CREATININE 2.06*  --  1.59* 1.48* 1.52*  CALCIUM 8.1*  --  8.7* 9.0 8.9  MG  --  1.2* 1.5* 2.5* 2.1   Liver Function Tests: No results for input(s): "AST", "ALT", "ALKPHOS", "BILITOT", "PROT", "ALBUMIN" in the last 168 hours. CBG: Recent Labs  Lab 11/15/22 1318 11/15/22 1611 11/15/22 2103 11/16/22 0739 11/16/22 1219  GLUCAP 191* 197* 297* 215* 247*    Discharge time spent: greater than 30 minutes.  Signed: Berle Mull, MD [facility] 11/16/2022

## 2022-11-21 ENCOUNTER — Encounter (HOSPITAL_COMMUNITY): Payer: Self-pay | Admitting: Family Medicine

## 2022-11-21 ENCOUNTER — Emergency Department (HOSPITAL_COMMUNITY): Payer: Medicaid Other

## 2022-11-21 ENCOUNTER — Inpatient Hospital Stay (HOSPITAL_COMMUNITY)
Admission: EM | Admit: 2022-11-21 | Discharge: 2022-11-23 | DRG: 683 | Disposition: A | Discharge status: Home / Self Care | Payer: Medicaid Other | Attending: Family Medicine | Admitting: Family Medicine

## 2022-11-21 ENCOUNTER — Other Ambulatory Visit: Payer: Self-pay

## 2022-11-21 DIAGNOSIS — G4733 Obstructive sleep apnea (adult) (pediatric): Secondary | ICD-10-CM | POA: Diagnosis present

## 2022-11-21 DIAGNOSIS — Z79899 Other long term (current) drug therapy: Secondary | ICD-10-CM

## 2022-11-21 DIAGNOSIS — N179 Acute kidney failure, unspecified: Principal | ICD-10-CM

## 2022-11-21 DIAGNOSIS — Z91018 Allergy to other foods: Secondary | ICD-10-CM

## 2022-11-21 DIAGNOSIS — E785 Hyperlipidemia, unspecified: Secondary | ICD-10-CM | POA: Diagnosis present

## 2022-11-21 DIAGNOSIS — E1122 Type 2 diabetes mellitus with diabetic chronic kidney disease: Secondary | ICD-10-CM | POA: Diagnosis present

## 2022-11-21 DIAGNOSIS — I959 Hypotension, unspecified: Secondary | ICD-10-CM | POA: Diagnosis present

## 2022-11-21 DIAGNOSIS — N189 Chronic kidney disease, unspecified: Secondary | ICD-10-CM

## 2022-11-21 DIAGNOSIS — Z7984 Long term (current) use of oral hypoglycemic drugs: Secondary | ICD-10-CM

## 2022-11-21 DIAGNOSIS — R202 Paresthesia of skin: Secondary | ICD-10-CM

## 2022-11-21 DIAGNOSIS — E119 Type 2 diabetes mellitus without complications: Secondary | ICD-10-CM

## 2022-11-21 DIAGNOSIS — M940 Chondrocostal junction syndrome [Tietze]: Secondary | ICD-10-CM | POA: Diagnosis present

## 2022-11-21 DIAGNOSIS — Z8249 Family history of ischemic heart disease and other diseases of the circulatory system: Secondary | ICD-10-CM

## 2022-11-21 DIAGNOSIS — I5032 Chronic diastolic (congestive) heart failure: Secondary | ICD-10-CM | POA: Diagnosis present

## 2022-11-21 DIAGNOSIS — I130 Hypertensive heart and chronic kidney disease with heart failure and stage 1 through stage 4 chronic kidney disease, or unspecified chronic kidney disease: Secondary | ICD-10-CM | POA: Diagnosis present

## 2022-11-21 DIAGNOSIS — Z833 Family history of diabetes mellitus: Secondary | ICD-10-CM

## 2022-11-21 DIAGNOSIS — R55 Syncope and collapse: Secondary | ICD-10-CM

## 2022-11-21 DIAGNOSIS — I2510 Atherosclerotic heart disease of native coronary artery without angina pectoris: Secondary | ICD-10-CM | POA: Diagnosis present

## 2022-11-21 DIAGNOSIS — E860 Dehydration: Secondary | ICD-10-CM | POA: Diagnosis present

## 2022-11-21 DIAGNOSIS — T465X5A Adverse effect of other antihypertensive drugs, initial encounter: Secondary | ICD-10-CM | POA: Diagnosis present

## 2022-11-21 DIAGNOSIS — Z7901 Long term (current) use of anticoagulants: Secondary | ICD-10-CM

## 2022-11-21 DIAGNOSIS — G8929 Other chronic pain: Secondary | ICD-10-CM | POA: Diagnosis present

## 2022-11-21 DIAGNOSIS — N1832 Chronic kidney disease, stage 3b: Secondary | ICD-10-CM | POA: Diagnosis present

## 2022-11-21 DIAGNOSIS — Z6841 Body Mass Index (BMI) 40.0 and over, adult: Secondary | ICD-10-CM

## 2022-11-21 DIAGNOSIS — D631 Anemia in chronic kidney disease: Secondary | ICD-10-CM | POA: Diagnosis present

## 2022-11-21 DIAGNOSIS — Z888 Allergy status to other drugs, medicaments and biological substances status: Secondary | ICD-10-CM

## 2022-11-21 DIAGNOSIS — E66813 Obesity, class 3: Secondary | ICD-10-CM | POA: Diagnosis present

## 2022-11-21 DIAGNOSIS — Z794 Long term (current) use of insulin: Secondary | ICD-10-CM

## 2022-11-21 DIAGNOSIS — Z8673 Personal history of transient ischemic attack (TIA), and cerebral infarction without residual deficits: Secondary | ICD-10-CM

## 2022-11-21 DIAGNOSIS — E1140 Type 2 diabetes mellitus with diabetic neuropathy, unspecified: Secondary | ICD-10-CM | POA: Diagnosis present

## 2022-11-21 DIAGNOSIS — M549 Dorsalgia, unspecified: Secondary | ICD-10-CM | POA: Diagnosis present

## 2022-11-21 DIAGNOSIS — I952 Hypotension due to drugs: Secondary | ICD-10-CM | POA: Diagnosis present

## 2022-11-21 DIAGNOSIS — I10 Essential (primary) hypertension: Secondary | ICD-10-CM | POA: Diagnosis present

## 2022-11-21 DIAGNOSIS — M1990 Unspecified osteoarthritis, unspecified site: Secondary | ICD-10-CM | POA: Diagnosis present

## 2022-11-21 DIAGNOSIS — Z955 Presence of coronary angioplasty implant and graft: Secondary | ICD-10-CM

## 2022-11-21 DIAGNOSIS — Z886 Allergy status to analgesic agent status: Secondary | ICD-10-CM

## 2022-11-21 DIAGNOSIS — Z86711 Personal history of pulmonary embolism: Secondary | ICD-10-CM | POA: Diagnosis present

## 2022-11-21 LAB — HEMOGLOBIN AND HEMATOCRIT, BLOOD
HCT: 29.5 % — ABNORMAL LOW (ref 39.0–52.0)
HCT: 28.6 % — ABNORMAL LOW (ref 39.0–52.0)
Hemoglobin: 9.4 g/dL — ABNORMAL LOW (ref 13.0–17.0)
Hemoglobin: 9.3 g/dL — ABNORMAL LOW (ref 13.0–17.0)

## 2022-11-21 LAB — CBC WITH DIFFERENTIAL/PLATELET
Abs Immature Granulocytes: 0.04 10*3/uL (ref 0.00–0.07)
Basophils Absolute: 0 10*3/uL (ref 0.0–0.1)
Basophils Relative: 1 %
Eosinophils Absolute: 0.2 10*3/uL (ref 0.0–0.5)
Eosinophils Relative: 2 %
HCT: 30.3 % — ABNORMAL LOW (ref 39.0–52.0)
Hemoglobin: 9.6 g/dL — ABNORMAL LOW (ref 13.0–17.0)
Immature Granulocytes: 1 %
Lymphocytes Relative: 25 %
Lymphs Abs: 2.2 10*3/uL (ref 0.7–4.0)
MCH: 31.1 pg (ref 26.0–34.0)
MCHC: 31.7 g/dL (ref 30.0–36.0)
MCV: 98.1 fL (ref 80.0–100.0)
Monocytes Absolute: 0.8 10*3/uL (ref 0.1–1.0)
Monocytes Relative: 10 %
Neutro Abs: 5.3 10*3/uL (ref 1.7–7.7)
Neutrophils Relative %: 61 %
Platelets: 219 10*3/uL (ref 150–400)
RBC: 3.09 MIL/uL — ABNORMAL LOW (ref 4.22–5.81)
RDW: 14.5 % (ref 11.5–15.5)
WBC: 8.6 10*3/uL (ref 4.0–10.5)
nRBC: 0 % (ref 0.0–0.2)

## 2022-11-21 LAB — COMPREHENSIVE METABOLIC PANEL
ALT: 25 U/L (ref 0–44)
AST: 23 U/L (ref 15–41)
Albumin: 3.9 g/dL (ref 3.5–5.0)
Alkaline Phosphatase: 29 U/L — ABNORMAL LOW (ref 38–126)
Anion gap: 10 (ref 5–15)
BUN: 43 mg/dL — ABNORMAL HIGH (ref 6–20)
CO2: 24 mmol/L (ref 22–32)
Calcium: 8.9 mg/dL (ref 8.9–10.3)
Chloride: 104 mmol/L (ref 98–111)
Creatinine, Ser: 2.18 mg/dL — ABNORMAL HIGH (ref 0.61–1.24)
GFR, Estimated: 34 mL/min — ABNORMAL LOW (ref >60)
Glucose, Bld: 149 mg/dL — ABNORMAL HIGH (ref 70–99)
Potassium: 4.3 mmol/L (ref 3.5–5.1)
Sodium: 138 mmol/L (ref 135–145)
Total Bilirubin: 0.4 mg/dL (ref 0.3–1.2)
Total Protein: 6.6 g/dL (ref 6.5–8.1)

## 2022-11-21 LAB — TROPONIN I (HIGH SENSITIVITY)
Troponin I (High Sensitivity): 4 ng/L (ref <18)
Troponin I (High Sensitivity): 4 ng/L (ref <18)

## 2022-11-21 LAB — TYPE AND SCREEN
ABO/RH(D): O POS
Antibody Screen: NEGATIVE

## 2022-11-21 LAB — LACTIC ACID, PLASMA
Lactic Acid, Venous: 2.4 mmol/L (ref 0.5–1.9)
Lactic Acid, Venous: 1.9 mmol/L (ref 0.5–1.9)

## 2022-11-21 LAB — CBG MONITORING, ED: Glucose-Capillary: 150 mg/dL — ABNORMAL HIGH (ref 70–99)

## 2022-11-21 LAB — PROTIME-INR
INR: 1.3 — ABNORMAL HIGH (ref 0.8–1.2)
Prothrombin Time: 15.6 seconds — ABNORMAL HIGH (ref 11.4–15.2)

## 2022-11-21 MED ORDER — SIMETHICONE 80 MG PO CHEW
80.0000 mg | CHEWABLE_TABLET | Freq: Four times a day (QID) | ORAL | Status: DC
  Administered 2022-11-21 – 2022-11-23 (×6): 80 mg via ORAL
  Filled 2022-11-21 (×7): qty 1

## 2022-11-21 MED ORDER — SERTRALINE HCL 50 MG PO TABS
50.0000 mg | ORAL_TABLET | Freq: Every morning | ORAL | Status: DC
  Administered 2022-11-22 – 2022-11-23 (×2): 50 mg via ORAL
  Filled 2022-11-21 (×2): qty 1

## 2022-11-21 MED ORDER — TIZANIDINE HCL 4 MG PO TABS
4.0000 mg | ORAL_TABLET | Freq: Every day | ORAL | Status: DC
  Administered 2022-11-21 – 2022-11-22 (×2): 4 mg via ORAL
  Filled 2022-11-21 (×2): qty 1

## 2022-11-21 MED ORDER — IOHEXOL 350 MG/ML SOLN
100.0000 mL | Freq: Once | INTRAVENOUS | Status: AC | PRN
  Administered 2022-11-21: 100 mL via INTRAVENOUS

## 2022-11-21 MED ORDER — GABAPENTIN 400 MG PO CAPS
800.0000 mg | ORAL_CAPSULE | Freq: Three times a day (TID) | ORAL | Status: DC
  Administered 2022-11-21 – 2022-11-23 (×5): 800 mg via ORAL
  Filled 2022-11-21 (×5): qty 2

## 2022-11-21 MED ORDER — ONDANSETRON HCL 4 MG PO TABS: 4.0000 mg | ORAL_TABLET | Freq: Four times a day (QID) | ORAL | Status: DC | PRN

## 2022-11-21 MED ORDER — PANTOPRAZOLE 80MG IVPB - SIMPLE MED
80.0000 mg | Freq: Once | INTRAVENOUS | Status: AC
  Administered 2022-11-21: 80 mg via INTRAVENOUS
  Filled 2022-11-21: qty 80

## 2022-11-21 MED ORDER — PANTOPRAZOLE SODIUM 40 MG IV SOLR: 40.0000 mg | Freq: Two times a day (BID) | INTRAVENOUS | Status: DC

## 2022-11-21 MED ORDER — TRAZODONE HCL 50 MG PO TABS
50.0000 mg | ORAL_TABLET | Freq: Every day | ORAL | Status: DC
  Administered 2022-11-21 – 2022-11-22 (×2): 50 mg via ORAL
  Filled 2022-11-21 (×2): qty 1

## 2022-11-21 MED ORDER — ACETAMINOPHEN 650 MG RE SUPP: 650.0000 mg | Freq: Four times a day (QID) | RECTAL | Status: DC | PRN

## 2022-11-21 MED ORDER — ACETAMINOPHEN 325 MG PO TABS: 650.0000 mg | ORAL_TABLET | Freq: Four times a day (QID) | ORAL | Status: DC | PRN

## 2022-11-21 MED ORDER — HYDROMORPHONE HCL 1 MG/ML IJ SOLN
0.5000 mg | Freq: Once | INTRAMUSCULAR | Status: AC
  Administered 2022-11-21: 0.5 mg via INTRAVENOUS
  Filled 2022-11-21: qty 1

## 2022-11-21 MED ORDER — SENNOSIDES-DOCUSATE SODIUM 8.6-50 MG PO TABS: 1.0000 | ORAL_TABLET | Freq: Every evening | ORAL | Status: DC | PRN

## 2022-11-21 MED ORDER — ATORVASTATIN CALCIUM 40 MG PO TABS
40.0000 mg | ORAL_TABLET | Freq: Every day | ORAL | Status: DC
  Administered 2022-11-22 – 2022-11-23 (×2): 40 mg via ORAL
  Filled 2022-11-21 (×2): qty 1

## 2022-11-21 MED ORDER — LACTATED RINGERS IV BOLUS
1000.0000 mL | Freq: Once | INTRAVENOUS | Status: AC
  Administered 2022-11-21: 1000 mL via INTRAVENOUS

## 2022-11-21 MED ORDER — LACTATED RINGERS IV BOLUS: 500.0000 mL | Freq: Once | INTRAVENOUS | Status: DC

## 2022-11-21 MED ORDER — INSULIN ASPART 100 UNIT/ML IJ SOLN
0.0000 [IU] | Freq: Three times a day (TID) | INTRAMUSCULAR | Status: DC
  Filled 2022-11-21: qty 0.15

## 2022-11-21 MED ORDER — ONDANSETRON HCL 4 MG/2ML IJ SOLN: 4.0000 mg | Freq: Four times a day (QID) | INTRAMUSCULAR | Status: DC | PRN

## 2022-11-21 NOTE — H&P (Addendum)
PCP:   Drue Flirt, MD   Chief Complaint:  Low blood pressure  HPI: This is a 62 year old male with past medical history of super morbid obesity BMI 47, type 2 diabetes on insulin, OSA, history of PE on Eliquis, history of recurrent bouts of renal failure.  He was admitted 1/17 to 1/24 with GI bleed BRBPR.  He was hypotensive and transfuse 1 unit packed red blood . while here he was seen by GI. Patient had EGD and colonoscopy in 2021 and 2022 without any evidence of active bleeding. Capsule endoscopy 1/19 small area of inflammation in the proximal jejunum no active bleeding seen anywhere. No evidence of internal bleeding on the CT abdomen.  In the hospital patient was hypotensive and was corrected with IV fluids and midodrine.  Patient not discharged on midodrine.  Today he saw his PCP.  His systolic blood pressure was 77.  They were was referred to the ER.  Per patient his blood sugar was also low, he does not recall what his fingerstick blood sugar was.  He denies any evidence of GI bleeding.  The patient is maintained on Eliquis.  To me patient denies dizziness.  He endorses left-sided chest pain that does not radiate.  He denies any associated shortness of breath.  He denies any aggravating factors.  He describes the pain as sharp, dull and pressure-like.  He states his last probably a few minutes then self resolved.    In the ER patient presenting blood pressure was 87/44.  He was bolused 1 L LR and his blood pressure improved to 108/52.  Patient's presenting hemoglobin was 9.6, repeat hemoglobin was 10.4.  History of diffuse guaiac.  The ER, he is chest pain-free.  Initial troponin normal, EKG without any acute ischemic changes  Per patient last night he blacked out and extra room.  This was for few seconds.  This is recorded nowhere in the chart.  Per patient nursing present at this time you.  Review of Systems:  The patient denies anorexia, fever, weight loss,, vision loss, decreased  hearing, hoarseness, chest pain, syncope, dyspnea on exertion, peripheral edema, balance deficits, hemoptysis, abdominal pain, melena, hematochezia, severe indigestion/heartburn, hematuria, incontinence, genital sores, muscle weakness, suspicious skin lesions, transient blindness, difficulty walking, depression, unusual weight change, abnormal bleeding, enlarged lymph nodes, angioedema, and breast masses. Positive: Low blood pressure, chest pain  Past Medical History: Past Medical History:  Diagnosis Date   Anemia    Arthritis    Back pain    CAD (coronary artery disease)    a. s/p DES to LAD in 05/2016   Cervical radiculopathy    Chronic diastolic CHF (congestive heart failure) (HCC)    Chronic pain    Deep vein thrombosis ([place]) 01/06/2016   Formatting of this note might be different from the original.  Formatting of this note might be different from the original. Last Assessment & Plan: Management per primary care. Last Assessment & Plan: Management per primary care.  Formatting of this note might be different from the original. Last Assessment & Plan: Management per primary care. Last Assessment & Plan: Management per primary care.   Depression    DVT (deep venous thrombosis) (HCC)    Hematemesis    Hepatic steatosis    Hyperlipidemia    Hypertension    IBS (irritable bowel syndrome)    Morbid obesity (HCC)    OSA (obstructive sleep apnea)    Pancreatitis    PE (pulmonary thromboembolism) ([place])  Peripheral neuropathy    PUD (peptic ulcer disease)    Pulmonary embolism ([place]) 07/15/2021   Renal disorder    Stroke [facility])    a. ?details unclear - not seen on imaging when he was admitted in 05/2017 for TIA symptoms which were felt due to cervical radiculopathy.   Thoracic aortic ectasia (HCC)    a. 4.3cm ectatic ascending thoracic aorta by CT 06/2017.    Type 2 diabetes mellitus ([place])    Past Surgical History:  Procedure Laterality Date   CARDIAC CATHETERIZATION N/A 05/31/2016    Procedure: Left Heart Cath and Coronary Angiography;  Surgeon: Peter M Martinique, MD;  Location: [place] [facility];  Service: Cardiovascular;  Laterality: N/A;   CARDIAC CATHETERIZATION N/A 05/31/2016   Procedure: Intravascular Pressure Wire/FFR Study;  Surgeon: Peter M Martinique, MD;  Location: [place] [facility];  Service: Cardiovascular;  Laterality: N/A;   CARDIAC CATHETERIZATION N/A 05/31/2016   Procedure: Coronary Stent Intervention;  Surgeon: Peter M Martinique, MD;  Location: [place] [facility];  Service: Cardiovascular;  Laterality: N/A;   COLONOSCOPY N/A 07/17/2021   Procedure: COLONOSCOPY;  Surgeon: Wilford Corner, MD;  Location: [facility];  Service: Endoscopy;  Laterality: N/A;   COLONOSCOPY WITH PROPOFOL N/A 04/29/2020   Procedure: COLONOSCOPY WITH PROPOFOL;  Surgeon: Wilford Corner, MD;  Location: [place];  Service: Endoscopy;  Laterality: N/A;   ESOPHAGOGASTRODUODENOSCOPY N/A 04/29/2020   Procedure: ESOPHAGOGASTRODUODENOSCOPY (EGD);  Surgeon: Wilford Corner, MD;  Location: [place];  Service: Endoscopy;  Laterality: N/A;   ESOPHAGOGASTRODUODENOSCOPY (EGD) WITH PROPOFOL N/A 07/17/2021   Procedure: ESOPHAGOGASTRODUODENOSCOPY (EGD) WITH PROPOFOL;  Surgeon: Wilford Corner, MD;  Location: [facility];  Service: Endoscopy;  Laterality: N/A;   GIVENS CAPSULE STUDY N/A 11/10/2022   Procedure: GIVENS CAPSULE STUDY;  Surgeon: [name], MD;  Location: [facility];  Service: Gastroenterology;  Laterality: N/A;   LEFT HEART CATH AND CORONARY ANGIOGRAPHY N/A 12/08/2017   Procedure: LEFT HEART CATH AND CORONARY ANGIOGRAPHY;  Surgeon: Leonie Man, MD;  Location: [place] [facility];  Service: Cardiovascular;  Laterality: N/A;   LEFT HEART CATHETERIZATION WITH CORONARY ANGIOGRAM N/A 02/03/2014   Procedure: LEFT HEART CATHETERIZATION WITH CORONARY ANGIOGRAM;  Surgeon: Pixie Casino, MD;  Location: [facility] CATH LAB;  Service: Cardiovascular;  Laterality: N/A;   left leg stent       POLYPECTOMY  04/29/2020   Procedure: POLYPECTOMY;  Surgeon: Wilford Corner, MD;  Location: [facility] ENDOSCOPY;  Service: Endoscopy;;   POLYPECTOMY  07/17/2021   Procedure: POLYPECTOMY;  Surgeon: Wilford Corner, MD;  Location: [facility] ENDOS[facility];  Service: Endoscopy;;    Medications: Prior to Admission medications   Medication Sig Start Date End Date Taking? Authorizing Provider  albuterol (VENTOLIN HFA) 108 (90 Base) MCG/ACT inhaler Inhale 2 puffs into the lungs as needed for wheezing or shortness of breath.    [provider]  atorvastatin (LIPITOR) 40 MG tablet Take 40 mg by mouth daily.    [provider]  B Complex-C (B-COMPLEX WITH VITAMIN C) tablet Take 1 tablet by mouth daily. 11/16/22   Lavina Hamman, MD  carvedilol (COREG) 6.25 MG tablet Take 1 tablet (6.25 mg total) by mouth 2 (two) times daily with a meal. 03/26/22   Hosie Poisson, MD  cyanocobalamin 1000 MCG tablet Take 1,000 mcg by mouth once a week. 03/06/20   [provider]  dicyclomine (BENTYL) 10 MG capsule Take 1 capsule (10 mg total) by mouth 3 (three) times daily as needed for spasms. 11/15/22  Lavina Hamman, MD  docusate sodium (COLACE) 100 MG capsule Take 1 capsule (100 mg total) by mouth 2 (two) times daily. 11/15/22   Lavina Hamman, MD  ELIQUIS 5 MG TABS tablet TAKE 1 TABLET BY MOUTH TWICE A DAY 10/03/22   Wyatt Portela, MD  fenofibrate (TRICOR) 145 MG tablet TAKE 1 TABLET (145 MG TOTAL) BY MOUTH DAILY. 05/19/22   Deberah Pelton, NP  ferrous sulfate 325 (65 FE) MG tablet Take 1 tablet (325 mg total) by mouth daily. 11/16/22 12/16/22  Lavina Hamman, MD  furosemide (LASIX) 40 MG tablet Take 40 mg by mouth 2 (two) times daily.    [provider]  gabapentin (NEURONTIN) 800 MG tablet Take 800 mg by mouth 3 (three) times daily.    [provider]  glipiZIDE (GLUCOTROL) 10 MG tablet Take 10 mg by mouth 2 (two) times daily. 05/30/17   [provider]  HUMULIN R U-500 KWIKPEN 500  UNIT/ML kwikpen Inject into the skin in the morning, at noon, and at bedtime. Per sliding scale 3 times daily with meals 06/04/20   [provider]  hydrocortisone (ANUSOL-HC) 25 MG suppository Place 1 suppository (25 mg total) rectally 2 (two) times daily. 11/15/22   Lavina Hamman, MD  Insulin Pen Needle 31G X 5 MM MISC Use 1 needle daily to inject insulin as prescribed 06/18/17   Barton Dubois, MD  isosorbide mononitrate (IMDUR) 30 MG 24 hr tablet TAKE 1 TABLET BY MOUTH DAILY Patient taking differently: Take 30 mg by mouth daily. 01/03/22   Hilty, Nadean Corwin, MD  ketotifen (ZADITOR) 0.035 % ophthalmic solution Place 1 drop into the right eye 2 (two) times daily for 5 days. 11/16/22 11/21/22  Lavina Hamman, MD  Menthol, Topical Analgesic, (BIOFREEZE EX) Apply 1 application  topically as needed (knee pain).    [provider]  nitroGLYCERIN (NITROSTAT) 0.4 MG SL tablet Place 1 tablet (0.4 mg total) under the tongue every 5 (five) minutes as needed for chest pain. 02/06/19   Skeet Latch, MD  ondansetron (ZOFRAN) 4 MG tablet Take 4 mg by mouth as needed for nausea/vomiting, vomiting or nausea. 04/21/20   [provider]  Oxycodone HCl 20 MG TABS Take 20 mg by mouth every 6 (six) hours as needed (pain).    [provider]  oxymetazoline (AFRIN) 0.05 % nasal spray Place 1 spray into both nostrils 2 (two) times daily as needed for congestion (nose bleeds). 11/16/22   Lavina Hamman, MD  pantoprazole (PROTONIX) 40 MG tablet Take 40 mg by mouth daily. 05/26/22   [provider]  polyethylene glycol (MIRALAX / GLYCOLAX) 17 g packet Take 17 g by mouth daily. 11/17/22   Lavina Hamman, MD  polyvinyl alcohol (LIQUIFILM TEARS) 1.4 % ophthalmic solution Place 1 drop into both eyes as needed for dry eyes. 11/16/22   Lavina Hamman, MD  potassium chloride (MICRO-K) 10 MEQ CR capsule Take 2 capsules (20 mEq total) by mouth 2 (two) times daily. 07/21/22   Lelon Perla,  MD  sertraline (ZOLOFT) 50 MG tablet Take 50 mg by mouth every morning. 12/17/20   [provider]  simethicone (MYLICON) 80 MG chewable tablet Chew 1 tablet (80 mg total) by mouth 4 (four) times daily. 11/15/22   Lavina Hamman, MD  tiZANidine (ZANAFLEX) 4 MG tablet Take 4 mg by mouth at bedtime. 03/31/20   [provider]  traZODone (DESYREL) 50 MG tablet Take 1 tablet (  50 mg total) by mouth at bedtime. 12/15/16   Florinda Marker, MD  Vitamin D, Ergocalciferol, (DRISDOL) 1.25 MG (50000 UT) CAPS capsule Take 50,000 Units by mouth every Monday. 08/15/19   [provider]    Allergies:   Allergies  Allergen Reactions   Coconut Flavor [Flavoring Agent] Hives   Ibuprofen Other (See Comments)    Made gastric ulcers worse   Aleve [Naproxen] Other (See Comments)    DUE TO KIDNEYS   Nsaids Other (See Comments)    Stomach ulcers    Vascepa [Icosapent Ethyl] Nausea And Vomiting and Other (See Comments)    headaches, chest pain, similar to sx of a stroke, hypotension    Other Hives and Rash    Nut Allergy    Social History:  reports that he has never smoked. He has been exposed to tobacco smoke. He has never used smokeless tobacco. He reports that he does not drink alcohol and does not use drugs.  Family History: Family History  Problem Relation Age of Onset   Cancer Father    Hypertension Mother    Diabetes Mother    Breast cancer Mother    Hypertension Brother    Diabetes Brother    Hypertension Sister    Diabetes Sister     Physical Exam: Vitals:   11/21/22 1951 11/21/22 2045 11/21/22 2100 11/21/22 2130  BP:  (!) 125/49 (!) 123/49 (!) 97/42  Pulse:  69 69 72  Resp:  '16 17 14  '$ Temp: (!) 97.5 F (36.4 C)     TempSrc: Oral     SpO2:  99% 95% 91%    General:  Alert and oriented times three, extreme morbid obesity.  No acute distress Eyes: PERRLA, pink conjunctiva, no scleral icterus ENT: Moist oral mucosa, neck supple, no thyromegaly Lungs: clear to  ascultation, no wheeze, no crackles, no use of accessory muscles Cardiovascular: regular rate and rhythm, no regurgitation, no gallops, no murmurs. No carotid bruits, no JVD, reproducible chest wall pain Abdomen: soft, positive BS, non-tender, non-distended, no organomegaly, not an acute abdomen.  Very obese abdomen. GU: not examined Neuro: CN II - XII grossly intact, sensation intact Musculoskeletal: strength 5/5 all extremities, no clubbing, cyanosis.  Chronic edema, patient is very thick TED stockings Skin: no rash, no subcutaneous crepitation, no decubitus Psych: appropriate patient  Labs on Admission:  Recent Labs    11/21/22 1639  NA 138  K 4.3  CL 104  CO2 24  GLUCOSE 149*  BUN 43*  CREATININE 2.18*  CALCIUM 8.9   Recent Labs    11/21/22 1639  AST 23  ALT 25  ALKPHOS 29*  BILITOT 0.4  PROT 6.6  ALBUMIN 3.9    Recent Labs    11/21/22 1639 11/21/22 2011  WBC 8.6  --   NEUTROABS 5.3  --   HGB 9.6* 9.4*  HCT 30.3* 29.5*  MCV 98.1  --   PLT 219  --     Radiological Exams on Admission: CT Angio Chest/Abd/Pel for Dissection W and/or Wo Contrast  Result Date: 11/21/2022 CLINICAL DATA:  Chest pain radiating to back, hypotension, recent GI bleed EXAM: CT ANGIOGRAPHY CHEST, ABDOMEN AND PELVIS TECHNIQUE: Non-contrast CT of the chest was initially obtained. Multidetector CT imaging through the chest, abdomen and pelvis was performed using the standard protocol during bolus administration of intravenous contrast. Multiplanar reconstructed images and MIPs were obtained and reviewed to evaluate the vascular anatomy. RADIATION DOSE REDUCTION: This exam was performed according to  the departmental dose-optimization program which includes automated exposure control, adjustment of the mA and/or kV according to patient size and/or use of iterative reconstruction technique. CONTRAST:  126m OMNIPAQUE IOHEXOL 350 MG/ML SOLN COMPARISON:  CT abdomen pelvis, 11/10/2022 FINDINGS: CTA CHEST  FINDINGS VASCULAR Aorta: Satisfactory opacification of the aorta. Normal contour and caliber of the thoracic aorta. No evidence of aneurysm, dissection, or other acute aortic pathology. Scattered aortic atherosclerosis. Cardiovascular: No evidence of pulmonary embolism on limited non-tailored examination. Cardiomegaly. Left coronary artery calcifications and or stents. Gross enlargement of the main pulmonary artery measuring up to 4.6 cm in caliber. No pericardial effusion. Review of the MIP images confirms the above findings. NON VASCULAR Mediastinum/Nodes: No enlarged mediastinal, hilar, or axillary lymph nodes. Thyroid gland, trachea, and esophagus demonstrate no significant findings. Lungs/Pleura: BCriss Rosalesappearing dependent bibasilar scarring or atelectasis. No pleural effusion or pneumothorax. Musculoskeletal: No chest wall abnormality. No acute osseous findings. Review of the MIP images confirms the above findings. CTA ABDOMEN AND PELVIS FINDINGS VASCULAR Normal contour and caliber of the abdominal aorta. No evidence of aneurysm, dissection, or other acute aortic pathology. Standard branching pattern of the abdominal aorta with solitary bilateral renal arteries. Scattered aortic atherosclerosis. Review of the MIP images confirms the above findings. NON-VASCULAR Hepatobiliary: No solid liver abnormality is seen. Hepatomegaly, maximum coronal span 23.6 cm. Hepatic steatosis. No gallstones, gallbladder wall thickening, or biliary dilatation. Pancreas: Unremarkable. No pancreatic ductal dilatation or surrounding inflammatory changes. Spleen: Normal in size without significant abnormality. Adrenals/Urinary Tract: Adrenal glands are unremarkable. Kidneys are normal, without renal calculi, solid lesion, or hydronephrosis. Bladder is unremarkable. Stomach/Bowel: Stomach is within normal limits. Appendix appears normal. No evidence of bowel wall thickening, distention, or inflammatory changes. Sigmoid diverticula.  Metallic clip within a diverticulum of the proximal sigmoid (series 5, image 154). Lymphatic: No enlarged abdominal or pelvic lymph nodes. Reproductive: No mass or other significant abnormality. Other: No abdominal wall hernia or abnormality. No ascites. Musculoskeletal: No acute osseous findings. IMPRESSION: 1. Normal contour and caliber of the thoracic and abdominal aorta. No evidence of aneurysm, dissection, or other acute aortic pathology. 2. Cardiomegaly and coronary artery disease. 3. Gross enlargement of the main pulmonary artery, as can be seen in pulmonary hypertension. 4. Hepatic steatosis and hepatomegaly. 5. Metallic clip within a diverticulum of the proximal sigmoid, presumably reflecting treated diverticular bleed given reported history of recent GI bleeding. Electronically Signed   By: ADelanna AhmadiM.D.   On: 11/21/2022 19:33   CT Head Wo Contrast  Result Date: 11/21/2022 CLINICAL DATA:  Dizziness, syncope EXAM: CT HEAD WITHOUT CONTRAST TECHNIQUE: Contiguous axial images were obtained from the base of the skull through the vertex without intravenous contrast. RADIATION DOSE REDUCTION: This exam was performed according to the departmental dose-optimization program which includes automated exposure control, adjustment of the mA and/or kV according to patient size and/or use of iterative reconstruction technique. COMPARISON:  11/10/2022 FINDINGS: Brain: No evidence of acute infarction, hemorrhage, hydrocephalus, extra-axial collection or mass lesion/mass effect. Vascular: No hyperdense vessel or unexpected calcification. Skull: Normal. Negative for fracture or focal lesion. Sinuses/Orbits: The visualized paranasal sinuses are essentially clear. The mastoid air cells are unopacified. Other: None. IMPRESSION: Normal head CT. Electronically Signed   By: SJulian HyM.D.   On: 11/21/2022 19:29   DG Chest 2 View  Result Date: 11/21/2022 CLINICAL DATA:  Chest pain EXAM: CHEST - 2 VIEW  COMPARISON:  None Available. FINDINGS: The heart size and mediastinal contours are within normal limits. Both lungs  are clear. The visualized skeletal structures are unremarkable. IMPRESSION: No active cardiopulmonary disease. Electronically Signed   By: Keane Police D.O.   On: 11/21/2022 17:01    Assessment/Plan Present on Admission:  Hypotension in patient with HTN history -recent hemorrhoidal GIB, patient still on Eliquis -Serial H&H -IV fluid hydration -?  Does patient need a low-dose of midodrine -Holding all blood pressure medications currently  Chest pain/costochondritis -EKG and cardiac enzymes x 3 negative -reproducible chest wall pain  Syncope -If patient passed out, likely orthostatic in nature -on tele. Orthostatic vitals ordered   Chronic diastolic CHF (congestive heart failure) (HCC) -Lasix on hold, blood pressure medications on hold including Imdur  Acute on chronic kidney injury stage 3b -IV fluid hydration.   -Patient's Lasix and ACE inhibitor d/ced last admission   Dyslipidemia -Continue fenofibrate and atorvastatin  Diabetes mellitus -Sliding scale insulin ordered   OSA (obstructive sleep apnea) -Does not use CPAP   History of pulmonary embolism -Remains on Eliquis  [name] 11/21/2022, 10:16 PM

## 2022-11-21 NOTE — ED Provider Triage Note (Cosign Needed Addendum)
Emergency Medicine Provider Triage Evaluation Note  [name] , a 62 y.o. male  was evaluated in triage.  Patient recently admitted to the hospital for GI bleeding, has a history of heart failure, anticoagulation for stroke and PE .  Pt complains of hypotension and chest pain.  States that he was scheduled for a "chest ultrasound" today.  He saw PCP and at the office the blood pressure was very low (77/46), prompting ED evaluation.  Currently complains of some vague chest pain.  Discharged from the hospital 11/16/2022 --GI evaluation at that time showed some jejunal inflammation on capsule endoscopy otherwise negative workup.  States that he is still on anticoagulation.  Review of Systems  Positive: Hypotension, pain Negative: Active rectal bleeding  Physical Exam  BP (!) 87/44   Pulse 64   Temp 97.7 F (36.5 C) (Oral)   Resp 18   SpO2 98%  Gen:   Awake, no distress   Resp:  Normal effort  MSK:   Moves extremities without difficulty  Other:    Medical Decision Making  Medically screening exam initiated at 4:39 PM.  Appropriate orders placed.  ERLIN GARDELLA was informed that the remainder of the evaluation will be completed by another provider, this initial triage assessment does not replace that evaluation, and the importance of remaining in the ED until their evaluation is complete.     Carlisle Cater, PA-C 11/21/22 0600

## 2022-11-21 NOTE — ED Provider Notes (Signed)
[facility] Provider Note   CSN: [id number] Arrival date & time: 11/21/22  1618     History {Add pertinent medical, surgical, social history, OB history to HPI:1} Chief Complaint  Patient presents with   Hypotension    [name] is a 62 y.o. male.  62 year old male with a history of PE on Eliquis, DM2, obesity, OSA, heart failure with preserved ejection fraction, CAD status post PCI, and upper GI bleed who presents to the emergency department with low blood pressure.  Patient woke up last night and reports that he felt dizzy and also had decreased hearing.  Says he checked his blood sugar and it was 113 and his blood pressure was 103/53 which were both low compared to his usual.  Ate something and then woke up in the morning and was still feeling dizzy.  Says that the hearing has improved.  Went to his primary care doctor's and his blood pressure was noted to be 77 systolic and he was referred into the emergency department.  Was recently hospitalized for an upper GI bleed.  Did not have endoscopy or colonoscopy but had capsule endoscopy on 1/19 that showed proximal jejunal inflammation without any bleeding.  It was presumed that he had hemorrhoidal bleed and was sent home.  Is still taking his Eliquis for his pulmonary embolism.  Says that he has had small amount of blood in his stool but no melena.  Has had adequate p.o. intake recently.  No recent changes to his blood pressure medicine.  Has had chest discomfort that he has difficulty characterizing but states that it is pressure-like, sharp, and dull.  Not pleuritic or exertional.  Does still have some dyspnea on exertion.  Says that while he was here getting his x-ray he passed out prior to coming back to the room but did not have head strike.         Home Medications Prior to Admission medications   Medication Sig Start Date End Date Taking? Authorizing Provider  albuterol (VENTOLIN  HFA) 108 (90 Base) MCG/ACT inhaler Inhale 2 puffs into the lungs as needed for wheezing or shortness of breath.    [provider]  atorvastatin (LIPITOR) 40 MG tablet Take 40 mg by mouth daily.    [provider]  B Complex-C (B-COMPLEX WITH VITAMIN C) tablet Take 1 tablet by mouth daily. 11/16/22   Lavina Hamman, MD  carvedilol (COREG) 6.25 MG tablet Take 1 tablet (6.25 mg total) by mouth 2 (two) times daily with a meal. 03/26/22   Hosie Poisson, MD  cyanocobalamin 1000 MCG tablet Take 1,000 mcg by mouth once a week. 03/06/20   [provider]  dicyclomine (BENTYL) 10 MG capsule Take 1 capsule (10 mg total) by mouth 3 (three) times daily as needed for spasms. 11/15/22   Lavina Hamman, MD  docusate sodium (COLACE) 100 MG capsule Take 1 capsule (100 mg total) by mouth 2 (two) times daily. 11/15/22   Lavina Hamman, MD  ELIQUIS 5 MG TABS tablet TAKE 1 TABLET BY MOUTH TWICE A DAY 10/03/22   Wyatt Portela, MD  fenofibrate (TRICOR) 145 MG tablet TAKE 1 TABLET (145 MG TOTAL) BY MOUTH DAILY. 05/19/22   Deberah Pelton, NP  ferrous sulfate 325 (65 FE) MG tablet Take 1 tablet (325 mg total) by mouth daily. 11/16/22 12/16/22  Lavina Hamman, MD  furosemide (LASIX) 40 MG tablet Take 40 mg by mouth 2 (two)  times daily.    [provider]  gabapentin (NEURONTIN) 800 MG tablet Take 800 mg by mouth 3 (three) times daily.    [provider]  glipiZIDE (GLUCOTROL) 10 MG tablet Take 10 mg by mouth 2 (two) times daily. 05/30/17   [provider]  HUMULIN R U-500 KWIKPEN 500 UNIT/ML kwikpen Inject into the skin in the morning, at noon, and at bedtime. Per sliding scale 3 times daily with meals 06/04/20   [provider]  hydrocortisone (ANUSOL-HC) 25 MG suppository Place 1 suppository (25 mg total) rectally 2 (two) times daily. 11/15/22   Lavina Hamman, MD  Insulin Pen Needle 31G X 5 MM MISC Use 1 needle daily to inject insulin as prescribed 06/18/17   Barton Dubois, MD  isosorbide mononitrate (IMDUR) 30 MG 24 hr tablet TAKE 1 TABLET BY MOUTH DAILY Patient taking differently: Take 30 mg by mouth daily. 01/03/22   Hilty, Nadean Corwin, MD  ketotifen (ZADITOR) 0.035 % ophthalmic solution Place 1 drop into the right eye 2 (two) times daily for 5 days. 11/16/22 11/21/22  Lavina Hamman, MD  Menthol, Topical Analgesic, (BIOFREEZE EX) Apply 1 application  topically as needed (knee pain).    [provider]  nitroGLYCERIN (NITROSTAT) 0.4 MG SL tablet Place 1 tablet (0.4 mg total) under the tongue every 5 (five) minutes as needed for chest pain. 02/06/19   Skeet Latch, MD  ondansetron (ZOFRAN) 4 MG tablet Take 4 mg by mouth as needed for nausea/vomiting, vomiting or nausea. 04/21/20   [provider]  Oxycodone HCl 20 MG TABS Take 20 mg by mouth every 6 (six) hours as needed (pain).    [provider]  oxymetazoline (AFRIN) 0.05 % nasal spray Place 1 spray into both nostrils 2 (two) times daily as needed for congestion (nose bleeds). 11/16/22   Lavina Hamman, MD  pantoprazole (PROTONIX) 40 MG tablet Take 40 mg by mouth daily. 05/26/22   [provider]  polyethylene glycol (MIRALAX / GLYCOLAX) 17 g packet Take 17 g by mouth daily. 11/17/22   Lavina Hamman, MD  polyvinyl alcohol (LIQUIFILM TEARS) 1.4 % ophthalmic solution Place 1 drop into both eyes as needed for dry eyes. 11/16/22   Lavina Hamman, MD  potassium chloride (MICRO-K) 10 MEQ CR capsule Take 2 capsules (20 mEq total) by mouth 2 (two) times daily. 07/21/22   Lelon Perla, MD  sertraline (ZOLOFT) 50 MG tablet Take 50 mg by mouth every morning. 12/17/20   [provider]  simethicone (MYLICON) 80 MG chewable tablet Chew 1 tablet (80 mg total) by mouth 4 (four) times daily. 11/15/22   Lavina Hamman, MD  tiZANidine (ZANAFLEX) 4 MG tablet Take 4 mg by mouth at bedtime. 03/31/20   [provider]  traZODone (DESYREL) 50 MG tablet Take 1 tablet (50 mg  total) by mouth at bedtime. 12/15/16   Florinda Marker, MD  Vitamin D, Ergocalciferol, (DRISDOL) 1.25 MG (50000 UT) CAPS capsule Take 50,000 Units by mouth every Monday. 08/15/19   [provider]      Allergies    Coconut flavor [flavoring agent], Ibuprofen, Aleve [naproxen], Nsaids, Vascepa [icosapent ethyl], and Other    Review of Systems   Review of Systems  Physical Exam Updated Vital Signs BP (!) 87/44   Pulse 64   Temp 97.7 F (36.5 C) (Oral)   Resp 18   SpO2 98%  Physical Exam Vitals and nursing note reviewed.  Constitutional:  General: He is not in acute distress.    Appearance: He is well-developed.  HENT:     Head: Normocephalic and atraumatic.     Right Ear: External ear normal.     Left Ear: External ear normal.     Nose: Nose normal.  Eyes:     Extraocular Movements: Extraocular movements intact.     Conjunctiva/sclera: Conjunctivae normal.     Pupils: Pupils are equal, round, and reactive to light.  Cardiovascular:     Rate and Rhythm: Normal rate and regular rhythm.     Heart sounds: Normal heart sounds.  Pulmonary:     Effort: Pulmonary effort is normal. No respiratory distress.     Breath sounds: Normal breath sounds.  Abdominal:     General: There is no distension.     Palpations: Abdomen is soft. There is no mass.     Tenderness: There is no abdominal tenderness. There is no guarding.  Genitourinary:    Comments: Declined rectal exam Musculoskeletal:     Cervical back: Normal range of motion and neck supple.     Right lower leg: No edema.     Left lower leg: No edema.  Skin:    General: Skin is warm and dry.     Capillary Refill: Capillary refill takes less than 2 seconds.  Neurological:     Mental Status: He is alert. Mental status is at baseline.  Psychiatric:        Mood and Affect: Mood normal.        Behavior: Behavior normal.     ED Results / Procedures / Treatments   Labs (all labs ordered are listed, but only abnormal  results are displayed) Labs Reviewed  CBG MONITORING, ED - Abnormal; Notable for the following components:      Result Value   Glucose-Capillary 150 (*)    All other components within normal limits  CBC WITH DIFFERENTIAL/PLATELET  COMPREHENSIVE METABOLIC PANEL  LACTIC ACID, PLASMA  LACTIC ACID, PLASMA  PROTIME-INR  TYPE AND SCREEN  TROPONIN I (HIGH SENSITIVITY)    EKG None  Radiology No results found.  Procedures Procedures     EMERGENCY DEPARTMENT [place] CARDIAC EXAM "Study: Limited Ultrasound of the Heart and Pericardium"  INDICATIONS:Abnormal vital signs, Chest pain, and Dyspnea Multiple views of the heart and pericardium were obtained in real-time with a multi-frequency probe.  PERFORMED ON:GEXBMW IMAGES ARCHIVED?: No LIMITATIONS:  Body habitus VIEWS USED: Subcostal 4 chamber, Parasternal long axis, and Parasternal short axis INTERPRETATION: Cardiac activity present, Pericardial effusioin absent, Normal contractility, and normal right ventricle size.   Medications Ordered in ED Medications - No data to display  ED Course/ Medical Decision Making/ A&P   {   Click here for ABCD2, HEART and other calculatorsREFRESH Note before signing :1}                          Medical Decision Making Amount and/or Complexity of Data Reviewed Radiology: ordered.   ***  {Document critical care time when appropriate:1} {Document review of labs and clinical decision tools ie heart score, Chads2Vasc2 etc:1}  {Document your independent review of radiology images, and any outside records:1} {Document your discussion with family members, caretakers, and with consultants:1} {Document social determinants of health affecting pt's care:1} {Document your decision making why or why not admission, treatments were needed:1} Final Clinical Impression(s) / ED Diagnoses Final diagnoses:  None    Rx / DC Orders ED Discharge Orders  None       

## 2022-11-21 NOTE — ED Triage Notes (Signed)
Pt states he went to PCP and was sent here for hypotension. Also c/o chest pain.

## 2022-11-22 ENCOUNTER — Observation Stay (HOSPITAL_COMMUNITY): Payer: Medicaid Other

## 2022-11-22 DIAGNOSIS — D631 Anemia in chronic kidney disease: Secondary | ICD-10-CM | POA: Diagnosis present

## 2022-11-22 DIAGNOSIS — Z955 Presence of coronary angioplasty implant and graft: Secondary | ICD-10-CM | POA: Diagnosis not present

## 2022-11-22 DIAGNOSIS — E1140 Type 2 diabetes mellitus with diabetic neuropathy, unspecified: Secondary | ICD-10-CM | POA: Diagnosis present

## 2022-11-22 DIAGNOSIS — Z8673 Personal history of transient ischemic attack (TIA), and cerebral infarction without residual deficits: Secondary | ICD-10-CM | POA: Diagnosis not present

## 2022-11-22 DIAGNOSIS — Z86711 Personal history of pulmonary embolism: Secondary | ICD-10-CM | POA: Diagnosis not present

## 2022-11-22 DIAGNOSIS — Z794 Long term (current) use of insulin: Secondary | ICD-10-CM | POA: Diagnosis not present

## 2022-11-22 DIAGNOSIS — M940 Chondrocostal junction syndrome [Tietze]: Secondary | ICD-10-CM | POA: Diagnosis present

## 2022-11-22 DIAGNOSIS — E785 Hyperlipidemia, unspecified: Secondary | ICD-10-CM | POA: Diagnosis present

## 2022-11-22 DIAGNOSIS — I952 Hypotension due to drugs: Secondary | ICD-10-CM

## 2022-11-22 DIAGNOSIS — G4733 Obstructive sleep apnea (adult) (pediatric): Secondary | ICD-10-CM | POA: Diagnosis present

## 2022-11-22 DIAGNOSIS — N1832 Chronic kidney disease, stage 3b: Secondary | ICD-10-CM | POA: Diagnosis present

## 2022-11-22 DIAGNOSIS — G8929 Other chronic pain: Secondary | ICD-10-CM | POA: Diagnosis present

## 2022-11-22 DIAGNOSIS — I130 Hypertensive heart and chronic kidney disease with heart failure and stage 1 through stage 4 chronic kidney disease, or unspecified chronic kidney disease: Secondary | ICD-10-CM | POA: Diagnosis present

## 2022-11-22 DIAGNOSIS — M549 Dorsalgia, unspecified: Secondary | ICD-10-CM | POA: Diagnosis present

## 2022-11-22 DIAGNOSIS — Z6841 Body Mass Index (BMI) 40.0 and over, adult: Secondary | ICD-10-CM | POA: Diagnosis not present

## 2022-11-22 DIAGNOSIS — E860 Dehydration: Secondary | ICD-10-CM | POA: Diagnosis present

## 2022-11-22 DIAGNOSIS — R202 Paresthesia of skin: Secondary | ICD-10-CM

## 2022-11-22 DIAGNOSIS — E1122 Type 2 diabetes mellitus with diabetic chronic kidney disease: Secondary | ICD-10-CM | POA: Diagnosis present

## 2022-11-22 DIAGNOSIS — N189 Chronic kidney disease, unspecified: Secondary | ICD-10-CM | POA: Diagnosis not present

## 2022-11-22 DIAGNOSIS — M545 Low back pain, unspecified: Secondary | ICD-10-CM

## 2022-11-22 DIAGNOSIS — N179 Acute kidney failure, unspecified: Secondary | ICD-10-CM | POA: Diagnosis not present

## 2022-11-22 DIAGNOSIS — I2510 Atherosclerotic heart disease of native coronary artery without angina pectoris: Secondary | ICD-10-CM | POA: Diagnosis present

## 2022-11-22 DIAGNOSIS — Z7901 Long term (current) use of anticoagulants: Secondary | ICD-10-CM | POA: Diagnosis not present

## 2022-11-22 DIAGNOSIS — R55 Syncope and collapse: Secondary | ICD-10-CM | POA: Diagnosis present

## 2022-11-22 DIAGNOSIS — Z8249 Family history of ischemic heart disease and other diseases of the circulatory system: Secondary | ICD-10-CM | POA: Diagnosis not present

## 2022-11-22 DIAGNOSIS — T465X5A Adverse effect of other antihypertensive drugs, initial encounter: Secondary | ICD-10-CM | POA: Diagnosis present

## 2022-11-22 DIAGNOSIS — I5032 Chronic diastolic (congestive) heart failure: Secondary | ICD-10-CM | POA: Diagnosis present

## 2022-11-22 LAB — URINALYSIS, ROUTINE W REFLEX MICROSCOPIC
Bacteria, UA: NONE SEEN
Bilirubin Urine: NEGATIVE
Glucose, UA: >=500 mg/dL — AB
Ketones, ur: NEGATIVE mg/dL
Leukocytes,Ua: NEGATIVE
Nitrite: NEGATIVE
Protein, ur: NEGATIVE mg/dL
Specific Gravity, Urine: 1.011 (ref 1.005–1.030)
pH: 5 (ref 5.0–8.0)

## 2022-11-22 LAB — BASIC METABOLIC PANEL
Anion gap: 12 (ref 5–15)
BUN: 38 mg/dL — ABNORMAL HIGH (ref 6–20)
CO2: 21 mmol/L — ABNORMAL LOW (ref 22–32)
Calcium: 8.9 mg/dL (ref 8.9–10.3)
Chloride: 104 mmol/L (ref 98–111)
Creatinine, Ser: 2.01 mg/dL — ABNORMAL HIGH (ref 0.61–1.24)
GFR, Estimated: 38 mL/min — ABNORMAL LOW (ref >60)
Glucose, Bld: 126 mg/dL — ABNORMAL HIGH (ref 70–99)
Potassium: 4.8 mmol/L (ref 3.5–5.1)
Sodium: 137 mmol/L (ref 135–145)

## 2022-11-22 LAB — GLUCOSE, CAPILLARY
Glucose-Capillary: 105 mg/dL — ABNORMAL HIGH (ref 70–99)
Glucose-Capillary: 115 mg/dL — ABNORMAL HIGH (ref 70–99)
Glucose-Capillary: 126 mg/dL — ABNORMAL HIGH (ref 70–99)
Glucose-Capillary: 132 mg/dL — ABNORMAL HIGH (ref 70–99)
Glucose-Capillary: 150 mg/dL — ABNORMAL HIGH (ref 70–99)
Glucose-Capillary: 186 mg/dL — ABNORMAL HIGH (ref 70–99)
Glucose-Capillary: 229 mg/dL — ABNORMAL HIGH (ref 70–99)

## 2022-11-22 LAB — HEMOGLOBIN AND HEMATOCRIT, BLOOD
HCT: 29.3 % — ABNORMAL LOW (ref 39.0–52.0)
HCT: 29.2 % — ABNORMAL LOW (ref 39.0–52.0)
Hemoglobin: 9.3 g/dL — ABNORMAL LOW (ref 13.0–17.0)
Hemoglobin: 9.6 g/dL — ABNORMAL LOW (ref 13.0–17.0)

## 2022-11-22 LAB — SODIUM, URINE, RANDOM: Sodium, Ur: 84 mmol/L

## 2022-11-22 LAB — CREATININE, URINE, RANDOM: Creatinine, Urine: 39 mg/dL

## 2022-11-22 MED ORDER — FERROUS SULFATE 325 (65 FE) MG PO TABS
325.0000 mg | ORAL_TABLET | Freq: Every day | ORAL | Status: DC
  Administered 2022-11-22 – 2022-11-23 (×2): 325 mg via ORAL
  Filled 2022-11-22 (×2): qty 1

## 2022-11-22 MED ORDER — FENOFIBRATE 54 MG PO TABS
54.0000 mg | ORAL_TABLET | Freq: Every day | ORAL | Status: DC
  Administered 2022-11-23: 54 mg via ORAL
  Filled 2022-11-22: qty 1

## 2022-11-22 MED ORDER — SODIUM CHLORIDE 0.9 % IV BOLUS
1000.0000 mL | Freq: Once | INTRAVENOUS | Status: AC
  Administered 2022-11-22: 1000 mL via INTRAVENOUS

## 2022-11-22 MED ORDER — OXYCODONE HCL 5 MG PO TABS
20.0000 mg | ORAL_TABLET | Freq: Four times a day (QID) | ORAL | Status: DC | PRN
  Administered 2022-11-22: 20 mg via ORAL
  Filled 2022-11-22: qty 4

## 2022-11-22 MED ORDER — LORAZEPAM 2 MG/ML IJ SOLN
1.0000 mg | Freq: Once | INTRAMUSCULAR | Status: AC | PRN
  Administered 2022-11-22: 1 mg via INTRAVENOUS
  Filled 2022-11-22 (×2): qty 1

## 2022-11-22 MED ORDER — MORPHINE SULFATE (PF) 2 MG/ML IV SOLN
2.0000 mg | Freq: Once | INTRAVENOUS | Status: AC
  Administered 2022-11-22: 2 mg via INTRAVENOUS
  Filled 2022-11-22: qty 1

## 2022-11-22 MED ORDER — LACTATED RINGERS IV SOLN: INTRAVENOUS | Status: DC

## 2022-11-22 MED ORDER — INSULIN ASPART 100 UNIT/ML IJ SOLN
0.0000 [IU] | INTRAMUSCULAR | Status: DC
  Administered 2022-11-22 (×2): 2 [IU] via SUBCUTANEOUS
  Administered 2022-11-22: 5 [IU] via SUBCUTANEOUS
  Administered 2022-11-22 – 2022-11-23 (×3): 3 [IU] via SUBCUTANEOUS

## 2022-11-22 MED ORDER — APIXABAN 5 MG PO TABS
5.0000 mg | ORAL_TABLET | Freq: Two times a day (BID) | ORAL | Status: DC
  Administered 2022-11-22 – 2022-11-23 (×2): 5 mg via ORAL
  Filled 2022-11-22 (×2): qty 1

## 2022-11-22 MED ORDER — PANTOPRAZOLE SODIUM 40 MG IV SOLR
40.0000 mg | Freq: Two times a day (BID) | INTRAVENOUS | Status: DC
  Administered 2022-11-22 – 2022-11-23 (×3): 40 mg via INTRAVENOUS
  Filled 2022-11-22 (×3): qty 10

## 2022-11-22 MED ORDER — LORAZEPAM 2 MG/ML IJ SOLN: 1.0000 mg | Freq: Once | INTRAMUSCULAR | Status: DC

## 2022-11-22 NOTE — Assessment & Plan Note (Addendum)
Probabaly iatrogenic from BP meds.  He has a history of that in the past.  Also, he mistakenly took his lisinopril on discharge he told me.  (He is somewhat unclear about his medications) - Hold Imdur, lisinopril, furosemide, Coreg - Continue IV fluids

## 2022-11-22 NOTE — Assessment & Plan Note (Signed)
Hypotensive - Hold Imdur, lisinopril, Coreg, furosemide

## 2022-11-22 NOTE — Assessment & Plan Note (Signed)
-  CPAP at night °

## 2022-11-22 NOTE — Assessment & Plan Note (Signed)
Hgb overnight stable.  No clinical bleeding - Resume Eliquis

## 2022-11-22 NOTE — Progress Notes (Signed)
Patient refused to begin MRI after being moved into the scanner. He stated that it was too tight on his shoulders and he couldn't do it.

## 2022-11-22 NOTE — Assessment & Plan Note (Signed)
Has had AKI in the past due to hypotension, but patient's baseline Cr is as low as 0.7 last year.  After his AKI in Jan 2024, his Cr only recovered to 1.5  Here it was 2.2 on admission, improved to 2.0 overnight with fluids.  CT ruled out obstruction. - Hold antihypertensives - Continue IV fluids - Check urine electrolytes and UA

## 2022-11-22 NOTE — Assessment & Plan Note (Signed)
-  Continue atorvastatin - Continue fibrate

## 2022-11-22 NOTE — Assessment & Plan Note (Signed)
Today, is complaining of left arm paresthesias (like it is "swollen") and maybe "can't make a fist".  On exam, arm looks normal.  I am concerned about a watershed infarct from his hypotension - Check MRI brain

## 2022-11-22 NOTE — Assessment & Plan Note (Signed)
BMI 50  

## 2022-11-22 NOTE — Progress Notes (Addendum)
Progress Note   Patient: [name]:[id number] DOB: 10-17-1961 DOA: 11/21/2022     0 DOS: the patient was seen and examined on 11/22/2022 at 9:32AM      Brief hospital course: [name] is a 62 y.o. 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  Was recently admitted 1/18 to 1/24 for AKI and hypotension and rectal bleeding.  Cr >5 on admission and hypotensive.  Evaluated by GI and underwent capsule endoscopy but Hgb remained stable.  With fluids and holding antihypertensives, Cr improved to 1.5 at discharge (baseline is 0.7-1.3)  Coreg and Imdur were resumed at d/c and patient inadvertently resumed his lisinopril as well.  On day of admission, woke with abnormal hearing "like everyone was talking like a muppet, 'wok wok wok'".  This resolved, but he was dizzy.  Went to PCP's office and BP was in the 70s, so sent to the ER.       Assessment and Plan: * Acute kidney injury superimposed on chronic kidney disease ([place]) Has had AKI in the past due to hypotension, but patient's baseline Cr is as low as 0.7 last year.  After his AKI in Jan 2024, his Cr only recovered to 1.5  Here it was 2.2 on admission, improved to 2.0 overnight with fluids.  CT ruled out obstruction. - Hold antihypertensives - Continue IV fluids - Check urine electrolytes and UA    Hypotension Probabaly iatrogenic from BP meds.  He has a history of that in the past.  Also, he mistakenly took his lisinopril on discharge he told me.  (He is somewhat unclear about his medications) - Hold Imdur, lisinopril, furosemide, Coreg - Continue IV fluids  Paresthesias Today, was complaining of left arm paresthesias (like it is "swollen") and maybe "can't make a fist".  On exam, arm looks normal.  I had concern for watershed infarct, but despite pre-treatment with Ativan, patient unable to tolerate MRI scan.   Given symptoms now completely resolved, I do not think follow up imaging would be useful.  History of pulmonary embolism Hgb overnight stable.  No clinical bleeding - Resume Eliquis  OSA (obstructive sleep apnea) - CPAP at night  Chronic diastolic CHF (congestive heart failure) (HCC) Appears dehydrated - Hold furosemide  Chronic back pain - Continue home oxycodone 20 TID  Coronary artery disease involving native coronary artery of native heart without angina pectoris - Continue atorvastatin - Continue fibrate  HTN (hypertension) Hypotensive - Hold Imdur, lisinopril, Coreg, furosemide  Dyslipidemia - Continue atorvastatin and fibrate  Obesity, Class III, BMI 40-49.9 (morbid obesity) (HCC) BMI >50  DM2 (diabetes mellitus, type 2) (HCC) Glucose controlled - Continue SS corrections          Subjective: Left arm paresthesias.  No more dizziness.  BP increased with standing.  No fever, no cough, no malaise, aches, no dysuria, flank pain, no abdominal pain.     Physical Exam: BP (!) 119/53 (BP Location: Left Arm)   Pulse 72   Temp 98.4 F (36.9 C) (Oral)   Resp 18   SpO2 96%   Obese adult male, sitting up in recliner, eating breakfast RRR, no murmurs, heart sounds distant, no peripheral pitting edema Respirations seems normal, lung sounds without wheezes, distant due to body habitus Abdomen soft no grimace to palpation Face symmetric, speech fluent, strength 5/5 in upper and lower extremities, oriented, EOMI,  I did not appreciate difference in the arms bilaterally, he feels that the arm is swollen, but not numb.     Data Reviewed: Hemoglobin stable at 9.6 Glucose was elevated but is resolved overnight Creatinine still 2.01  Family Communication: None present    Disposition: Status is: Inpatient The patient presented with hypotension  I suspect this is related to his blood pressure medicines, he is improving with holding his blood pressure medicines  and giving IV fluids  However his kidney function is still not better, so we will continue IV fluids monitor urine output, monitor kidney function  If creatinine improved tomorrow, MRI normal, possibly home tomorrow or the next day        Author: Edwin Dada, MD 11/22/2022 4:46 PM  For on call review [email or web address].

## 2022-11-22 NOTE — Assessment & Plan Note (Signed)
-  Continue atorvastatin and fibrate

## 2022-11-22 NOTE — Assessment & Plan Note (Signed)
-  Continue home oxycodone 20 TID

## 2022-11-22 NOTE — Assessment & Plan Note (Signed)
Glucose controlled - Continue SS corrections

## 2022-11-22 NOTE — Hospital Course (Signed)
[name] is a 62 y.o. 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  Was recently admitted 1/18 to 1/24 for AKI and hypotension and rectal bleeding.  Cr >5 on admission and hypotensive.  Evaluated by GI and underwent capsule endoscopy but Hgb remained stable.  With fluids and holding antihypertensives, Cr improved to 1.5 at discharge (baseline is 0.7-1.3)  Coreg and Imdur were resumed at d/c and patient inadvertently resumed his lisinopril as well.  On day of admission, woke with abnormal hearing "like everyone was talking like a muppet, 'wok wok wok'".  This resolved, but he was dizzy.  Went to PCP's office and BP was in the 70s, so sent to the ER.

## 2022-11-22 NOTE — Assessment & Plan Note (Signed)
Appears dehydrated - Hold furosemide

## 2022-11-23 DIAGNOSIS — I952 Hypotension due to drugs: Secondary | ICD-10-CM | POA: Diagnosis not present

## 2022-11-23 DIAGNOSIS — I5032 Chronic diastolic (congestive) heart failure: Secondary | ICD-10-CM | POA: Diagnosis not present

## 2022-11-23 DIAGNOSIS — N189 Chronic kidney disease, unspecified: Secondary | ICD-10-CM | POA: Diagnosis not present

## 2022-11-23 DIAGNOSIS — N179 Acute kidney failure, unspecified: Secondary | ICD-10-CM | POA: Diagnosis not present

## 2022-11-23 LAB — BASIC METABOLIC PANEL
Anion gap: 10 (ref 5–15)
BUN: 29 mg/dL — ABNORMAL HIGH (ref 6–20)
CO2: 23 mmol/L (ref 22–32)
Calcium: 9.2 mg/dL (ref 8.9–10.3)
Chloride: 104 mmol/L (ref 98–111)
Creatinine, Ser: 1.13 mg/dL (ref 0.61–1.24)
GFR, Estimated: >60 mL/min (ref >60)
Glucose, Bld: 170 mg/dL — ABNORMAL HIGH (ref 70–99)
Potassium: 4.4 mmol/L (ref 3.5–5.1)
Sodium: 137 mmol/L (ref 135–145)

## 2022-11-23 LAB — GLUCOSE, CAPILLARY
Glucose-Capillary: 170 mg/dL — ABNORMAL HIGH (ref 70–99)
Glucose-Capillary: 183 mg/dL — ABNORMAL HIGH (ref 70–99)

## 2022-11-23 LAB — CBC
HCT: 30.7 % — ABNORMAL LOW (ref 39.0–52.0)
Hemoglobin: 9.6 g/dL — ABNORMAL LOW (ref 13.0–17.0)
MCH: 31.5 pg (ref 26.0–34.0)
MCHC: 31.3 g/dL (ref 30.0–36.0)
MCV: 100.7 fL — ABNORMAL HIGH (ref 80.0–100.0)
Platelets: 191 10*3/uL (ref 150–400)
RBC: 3.05 MIL/uL — ABNORMAL LOW (ref 4.22–5.81)
RDW: 14.4 % (ref 11.5–15.5)
WBC: 6.6 10*3/uL (ref 4.0–10.5)
nRBC: 0 % (ref 0.0–0.2)

## 2022-11-23 NOTE — Progress Notes (Signed)
  Transition of Care [facility]) Screening Note   Patient Details  Name: [name] Date of Birth: 04/19/1961   Transition of Care [facility]) CM/SW Contact:    Vassie Moselle, LCSW Phone Number: 11/23/2022, 9:17 AM    Transition of Care Department [facility]) has reviewed patient and no TOC needs have been identified at this time. We will continue to monitor patient advancement through interdisciplinary progression rounds. If new patient transition needs arise, please place a TOC consult.

## 2022-11-23 NOTE — Progress Notes (Signed)
PT refused CPAP.  

## 2022-11-23 NOTE — Discharge Summary (Signed)
Physician Discharge Summary   Patient: [name] MRN: [id number] DOB: [birth year]  Admit date:     11/21/2022  Discharge date: 11/23/22  Discharge Physician: Murray Hodgkins   PCP: Drue Flirt, MD   Recommendations at discharge:  * Acute kidney injury superimposed on chronic kidney disease [facility]) Renal function appears to have recovered at this time.  CT without acute findings.   Given report of low blood pressure at home, hold carvedilol on discharge.  Follow-up with PCP in 1 week.  Continue Lasix given chronic edema.    Hypotension Probabaly iatrogenic from BP meds.  He has a history of that in the past.  Also, he mistakenly took his lisinopril on discharge.  Resolved here.  Stop lisinopril, hold carvedilol until follow-up.  Continue diuretic.   HTN (hypertension) Hypotension resolved.  Now mildly hypotensive. -Continue to hold lisinopril, Coreg Resume Imdur and Lasix    Discharge Diagnoses: Principal Problem:   Acute kidney injury superimposed on chronic kidney disease ([place]) Active Problems:   Hypotension   Paresthesias   DM2 (diabetes mellitus, type 2) (HCC)   Obesity, Class III, BMI 40-49.9 (morbid obesity) ([place])   Dyslipidemia   HTN (hypertension)   Coronary artery disease involving native coronary artery of native heart without angina pectoris   Chronic back pain   Chronic diastolic CHF (congestive heart failure) (HCC)   OSA (obstructive sleep apnea)   History of pulmonary embolism  Resolved Problems:   * No resolved hospital problems. *  Hospital Course: 62 year old man PMH including CAD, diastolic heart failure, presented with hypotension.  Also recently admitted earlier this month for AKI and hypotension and rectal bleeding.  Presented with hypotension and dizziness.  Had dizziness while getting an x-ray and passed out.  Treated acute kidney injury with IV fluids with resolution.  Hypotension resolved and was probably iatrogenic secondary to blood  pressure medicines which were held.  No further issues..  Patient reports he has been running a low blood pressure at home.  Hospitalization uncomplicated.  Discharged home in good condition.    * Acute kidney injury superimposed on chronic kidney disease ([place]) Renal function appears to have recovered at this time.  CT without acute findings.   Given report of low blood pressure at home, hold carvedilol on discharge.  Follow-up with PCP in 1 week.  Continue Lasix given chronic edema.    Hypotension Probabaly iatrogenic from BP meds.  He has a history of that in the past.  Also, he mistakenly took his lisinopril on discharge.  Resolved here.  Stop lisinopril, hold carvedilol until follow-up.  Continue diuretic.  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  During hospitalization complained of some left arm paresthesias like it was swollen.  Exam was benign.  There was initially some concern for CNS event but patient was unable to tolerate MRI.  Given resolution of symptoms, as well as history of neuropathy and no objective findings on exam, no further follow-up suggested.    History of pulmonary embolism No clinical bleeding Continue Eliquis   OSA (obstructive sleep apnea) - CPAP at night   Chronic diastolic CHF (congestive heart failure) (HCC) -, Swelling.  Can resume Lasix on discharge.   Chronic back pain - Continue home oxycodone 20 TID   Coronary artery disease involving native coronary artery of native heart without angina pectoris - Continue atorvastatin - Continue fibrate   HTN (hypertension) Hypotension resolved.  Now mildly hypotensive. -  Continue to hold lisinopril, Coreg Resume Imdur and Lasix   Dyslipidemia - Continue atorvastatin and fibrate   Obesity, Class III, BMI 40-49.9 (morbid obesity) (HCC) BMI >50   DM2 (diabetes mellitus, type 2) (HCC) Glucose controlled Resume outpatient  medicine.  Was also reported to have had low blood sugar in his PCPs office, patient reports he did not eat well that day but did not take his medications.  Hypoglycemia in the outpatient setting is probably related to unusually low oral intake.      Consultants:  None  Procedures performed:  None   Disposition: Home Diet recommendation:  Discharge Diet Orders (From admission, onward)     Start     Ordered   11/23/22 0000  Diet - low sodium heart healthy        11/23/22 0927           Cardiac diet DISCHARGE MEDICATION: Allergies as of 11/23/2022       Reactions   Coconut Flavor [flavoring Agent] Hives   Ibuprofen Other (See Comments)   Made gastric ulcers worse   Aleve [naproxen] Other (See Comments)   DUE TO KIDNEYS   Nsaids Other (See Comments)   Stomach ulcers   Vascepa [icosapent Ethyl] Nausea And Vomiting, Other (See Comments)   headaches, chest pain, similar to sx of a stroke, hypotension    Other Hives, Rash   Nut Allergy        Medication List     STOP taking these medications    carvedilol 6.25 MG tablet Commonly known as: COREG   ketotifen 0.035 % ophthalmic solution Commonly known as: ZADITOR       TAKE these medications    albuterol 108 (90 Base) MCG/ACT inhaler Commonly known as: VENTOLIN HFA Inhale 2 puffs into the lungs as needed for wheezing or shortness of breath.   atorvastatin 40 MG tablet Commonly known as: LIPITOR Take 40 mg by mouth daily.   B-complex with vitamin C tablet Take 1 tablet by mouth daily.   BIOFREEZE EX Apply 1 application  topically as needed (knee pain).   cyanocobalamin 1000 MCG tablet Take 1,000 mcg by mouth once a week.   dicyclomine 10 MG capsule Commonly known as: BENTYL Take 1 capsule (10 mg total) by mouth 3 (three) times daily as needed for spasms.   docusate sodium 100 MG capsule Commonly known as: COLACE Take 1 capsule (100 mg total) by mouth 2 (two) times daily. What changed:  when to  take this reasons to take this   Eliquis 5 MG Tabs tablet Generic drug: apixaban TAKE 1 TABLET BY MOUTH TWICE A DAY   fenofibrate 145 MG tablet Commonly known as: TRICOR TAKE 1 TABLET (145 MG TOTAL) BY MOUTH DAILY.   ferrous sulfate 325 (65 FE) MG tablet Take 1 tablet (325 mg total) by mouth daily.   furosemide 40 MG tablet Commonly known as: LASIX Take 40 mg by mouth 2 (two) times daily.   gabapentin 800 MG tablet Commonly known as: NEURONTIN Take 800 mg by mouth 3 (three) times daily.   glipiZIDE 10 MG tablet Commonly known as: GLUCOTROL Take 10 mg by mouth 2 (two) times daily.   HumuLIN R U-500 KwikPen 500 UNIT/ML KwikPen Generic drug: insulin regular human CONCENTRATED Inject 95-120 Units into the skin in the morning, at noon, and at bedtime. Per sliding scale 3 times daily with meals   hydrocortisone 25 MG suppository Commonly known as: ANUSOL-HC Place 1 suppository (25 mg total) rectally  2 (two) times daily. What changed:  when to take this reasons to take this   Insulin Pen Needle 31G X 5 MM Misc Use 1 needle daily to inject insulin as prescribed   isosorbide mononitrate 30 MG 24 hr tablet Commonly known as: IMDUR TAKE 1 TABLET BY MOUTH DAILY   nitroGLYCERIN 0.4 MG SL tablet Commonly known as: NITROSTAT Place 1 tablet (0.4 mg total) under the tongue every 5 (five) minutes as needed for chest pain.   ondansetron 4 MG tablet Commonly known as: ZOFRAN Take 4 mg by mouth as needed for nausea/vomiting, vomiting or nausea.   Oxycodone HCl 20 MG Tabs Take 20 mg by mouth every 6 (six) hours as needed (pain).   oxymetazoline 0.05 % nasal spray Commonly known as: AFRIN Place 1 spray into both nostrils 2 (two) times daily as needed for congestion (nose bleeds).   pantoprazole 40 MG tablet Commonly known as: PROTONIX Take 40 mg by mouth daily.   polyethylene glycol 17 g packet Commonly known as: MIRALAX / GLYCOLAX Take 17 g by mouth daily. What changed:   when to take this reasons to take this   polyvinyl alcohol 1.4 % ophthalmic solution Commonly known as: LIQUIFILM TEARS Place 1 drop into both eyes as needed for dry eyes.   potassium chloride 10 MEQ CR capsule Commonly known as: MICRO-K Take 2 capsules (20 mEq total) by mouth 2 (two) times daily.   sertraline 50 MG tablet Commonly known as: ZOLOFT Take 50 mg by mouth every morning.   simethicone 80 MG chewable tablet Commonly known as: MYLICON Chew 1 tablet (80 mg total) by mouth 4 (four) times daily.   tiZANidine 4 MG tablet Commonly known as: ZANAFLEX Take 4 mg by mouth at bedtime.   traZODone 50 MG tablet Commonly known as: DESYREL Take 1 tablet (50 mg total) by mouth at bedtime.   Vitamin D (Ergocalciferol) 1.25 MG (50000 UNIT) Caps capsule Commonly known as: DRISDOL Take 50,000 Units by mouth every Monday.        Follow-up Information     Drue Flirt, MD. Schedule an appointment as soon as possible for a visit in 1 week(s).   Specialty: Family Medicine Contact information: [address] [place] [address] [phone]                Feels fine, no paresthesias, no weakness or numbness.  Does have neuropathy in hands and feet, most prominent in his hand at this time.  No difficulty speaking or swallowing.  Discharge Exam: There were no vitals filed for this visit. Physical Exam Vitals reviewed.  Constitutional:      General: He is not in acute distress.    Appearance: He is not ill-appearing or toxic-appearing.  Cardiovascular:     Rate and Rhythm: Normal rate and regular rhythm.     Heart sounds: No murmur heard. Pulmonary:     Effort: Pulmonary effort is normal. No respiratory distress.     Breath sounds: No wheezing, rhonchi or rales.  Neurological:     Mental Status: He is alert.     Cranial Nerves: Cranial nerves 2-12 are intact. No facial asymmetry.     Motor: Motor function is intact. No weakness, abnormal muscle tone  or pronator drift.     Coordination: Coordination is intact. Finger-Nose-Finger Test normal.  Psychiatric:        Mood and Affect: Mood normal.        Behavior: Behavior normal.  Condition at discharge: good  The results of significant diagnostics from this hospitalization (including imaging, microbiology, ancillary and laboratory) are listed below for reference.   Imaging Studies: DG Elbow 2 Views Left  Result Date: 11/22/2022 CLINICAL DATA:  Metal screening for MRI EXAM: LEFT ELBOW - 2 VIEW COMPARISON:  None Available. FINDINGS: There is no evidence of fracture, dislocation, or joint effusion. Degenerative changes of the elbow joint. Soft tissues are unremarkable. No metallic radiodensity. IMPRESSION: 1. No radiopaque or metallic foreign body. 2. Degenerative changes of the elbow joint. Electronically Signed   By: Darrin Nipper M.D.   On: 11/22/2022 15:56   CT Angio Chest/Abd/Pel for Dissection W and/or Wo Contrast  Result Date: 11/21/2022 CLINICAL DATA:  Chest pain radiating to back, hypotension, recent GI bleed EXAM: CT ANGIOGRAPHY CHEST, ABDOMEN AND PELVIS TECHNIQUE: Non-contrast CT of the chest was initially obtained. Multidetector CT imaging through the chest, abdomen and pelvis was performed using the standard protocol during bolus administration of intravenous contrast. Multiplanar reconstructed images and MIPs were obtained and reviewed to evaluate the vascular anatomy. RADIATION DOSE REDUCTION: This exam was performed according to the departmental dose-optimization program which includes automated exposure control, adjustment of the mA and/or kV according to patient size and/or use of iterative reconstruction technique. CONTRAST:  129m OMNIPAQUE IOHEXOL 350 MG/ML SOLN COMPARISON:  CT abdomen pelvis, 11/10/2022 FINDINGS: CTA CHEST FINDINGS VASCULAR Aorta: Satisfactory opacification of the aorta. Normal contour and caliber of the thoracic aorta. No evidence of aneurysm, dissection, or  other acute aortic pathology. Scattered aortic atherosclerosis. Cardiovascular: No evidence of pulmonary embolism on limited non-tailored examination. Cardiomegaly. Left coronary artery calcifications and or stents. Gross enlargement of the main pulmonary artery measuring up to 4.6 cm in caliber. No pericardial effusion. Review of the MIP images confirms the above findings. NON VASCULAR Mediastinum/Nodes: No enlarged mediastinal, hilar, or axillary lymph nodes. Thyroid gland, trachea, and esophagus demonstrate no significant findings. Lungs/Pleura: BCriss Rosalesappearing dependent bibasilar scarring or atelectasis. No pleural effusion or pneumothorax. Musculoskeletal: No chest wall abnormality. No acute osseous findings. Review of the MIP images confirms the above findings. CTA ABDOMEN AND PELVIS FINDINGS VASCULAR Normal contour and caliber of the abdominal aorta. No evidence of aneurysm, dissection, or other acute aortic pathology. Standard branching pattern of the abdominal aorta with solitary bilateral renal arteries. Scattered aortic atherosclerosis. Review of the MIP images confirms the above findings. NON-VASCULAR Hepatobiliary: No solid liver abnormality is seen. Hepatomegaly, maximum coronal span 23.6 cm. Hepatic steatosis. No gallstones, gallbladder wall thickening, or biliary dilatation. Pancreas: Unremarkable. No pancreatic ductal dilatation or surrounding inflammatory changes. Spleen: Normal in size without significant abnormality. Adrenals/Urinary Tract: Adrenal glands are unremarkable. Kidneys are normal, without renal calculi, solid lesion, or hydronephrosis. Bladder is unremarkable. Stomach/Bowel: Stomach is within normal limits. Appendix appears normal. No evidence of bowel wall thickening, distention, or inflammatory changes. Sigmoid diverticula. Metallic clip within a diverticulum of the proximal sigmoid (series 5, image 154). Lymphatic: No enlarged abdominal or pelvic lymph nodes. Reproductive: No mass  or other significant abnormality. Other: No abdominal wall hernia or abnormality. No ascites. Musculoskeletal: No acute osseous findings. IMPRESSION: 1. Normal contour and caliber of the thoracic and abdominal aorta. No evidence of aneurysm, dissection, or other acute aortic pathology. 2. Cardiomegaly and coronary artery disease. 3. Gross enlargement of the main pulmonary artery, as can be seen in pulmonary hypertension. 4. Hepatic steatosis and hepatomegaly. 5. Metallic clip within a diverticulum of the proximal sigmoid, presumably reflecting treated diverticular bleed given reported history of  recent GI bleeding. Electronically Signed   By: Delanna Ahmadi M.D.   On: 11/21/2022 19:33   CT Head Wo Contrast  Result Date: 11/21/2022 CLINICAL DATA:  Dizziness, syncope EXAM: CT HEAD WITHOUT CONTRAST TECHNIQUE: Contiguous axial images were obtained from the base of the skull through the vertex without intravenous contrast. RADIATION DOSE REDUCTION: This exam was performed according to the departmental dose-optimization program which includes automated exposure control, adjustment of the mA and/or kV according to patient size and/or use of iterative reconstruction technique. COMPARISON:  11/10/2022 FINDINGS: Brain: No evidence of acute infarction, hemorrhage, hydrocephalus, extra-axial collection or mass lesion/mass effect. Vascular: No hyperdense vessel or unexpected calcification. Skull: Normal. Negative for fracture or focal lesion. Sinuses/Orbits: The visualized paranasal sinuses are essentially clear. The mastoid air cells are unopacified. Other: None. IMPRESSION: Normal head CT. Electronically Signed   By: Julian Hy M.D.   On: 11/21/2022 19:29   DG Chest 2 View  Result Date: 11/21/2022 CLINICAL DATA:  Chest pain EXAM: CHEST - 2 VIEW COMPARISON:  None Available. FINDINGS: The heart size and mediastinal contours are within normal limits. Both lungs are clear. The visualized skeletal structures are  unremarkable. IMPRESSION: No active cardiopulmonary disease. Electronically Signed   By: Keane Police D.O.   On: 11/21/2022 17:01   DG SMALL BOWEL W DOUBLE CM (HD)  Result Date: 11/15/2022 CLINICAL DATA:  Provided history: Anemia. Abnormal capsule endoscopy. Additional history obtained from the [place] capsule endoscopy showing a small area of inflammation (likely in the proximal jejunum), small polyp in the ileum. EXAM: SMALL BOWEL SERIES COMPARISON:  CT abdomen/pelvis 11/10/2022. TECHNIQUE: Following ingestion of thin barium, serial small bowel images were obtained including spot views of the terminal ileum. The examination was performed by Rushie Nyhan, NP and was supervised and interpreted by Dr. Kellie Simmering. FLUOROSCOPY: Fluoroscopy time: 1 minute, 30 seconds (1019.19 mGy). FINDINGS: Prior to the fluoroscopic examination, scout radiographs of the abdomen and pelvis were obtained. Nonobstructive bowel gas pattern. Thoracolumbar spondylosis. Subsequently, the small-bowel follow-through was performed. The terminal ileum was suboptimally assessed (despite paddle compression) due to patient body habitus and multiple overlying bowel loops. Within this limitation, no definite small-bowel mucosal abnormality was identified. There was no appreciable small bowel stricture. No small bowel dilation. IMPRESSION: Terminal ileum suboptimally assessed, as described. Within this limitation, no small bowel mucosal abnormality or stricture is identified. Specifically, the known area of inflammation in the proximal jejunum, and the known small ileal polyp, are not appreciable by fluoroscopy. Electronically Signed   By: Kellie Simmering D.O.   On: 11/15/2022 14:08   DG CHEST PORT 1 VIEW  Result Date: 11/12/2022 CLINICAL DATA:  [id number] Chest pain [id number] EXAM: PORTABLE CHEST 1 VIEW COMPARISON:  08/15/2022 FINDINGS: Heart and mediastinal contours are within normal limits. No focal opacities or  effusions. No acute bony abnormality. IMPRESSION: No active disease. Electronically Signed   By: Rolm Baptise M.D.   On: 11/12/2022 09:26   CT ABDOMEN PELVIS WO CONTRAST  Result Date: 11/10/2022 CLINICAL DATA:  Renal failure.  Hydronephrosis on ultrasound EXAM: CT ABDOMEN AND PELVIS WITHOUT CONTRAST TECHNIQUE: Multidetector CT imaging of the abdomen and pelvis was performed following the standard protocol without IV contrast. RADIATION DOSE REDUCTION: This exam was performed according to the departmental dose-optimization program which includes automated exposure control, adjustment of the mA and/or kV according to patient size and/or use of iterative reconstruction technique. COMPARISON:  Ultrasound 11/10/2022 earlier.  Old CT scan 2021 July FINDINGS: Lower  chest: There is some linear opacity lung bases likely scar or atelectasis. Breathing motion. No pleural effusion. Hepatobiliary: Diffuse fatty liver infiltration with some areas of patchy sparing. Gallbladder is nondilated. Pancreas: Preserved pancreatic parenchyma without obvious mass on this noncontrast exam. Spleen: Spleen is nonenlarged. Adrenals/Urinary Tract: Adrenal glands are preserved. Punctate nonobstructing stones along the lower pole of the right kidney. No left-sided renal stones. No ureteral stones. Prominent renal sinus fat. No renal collecting system dilatation by CT. Findings by ultrasound may have been technical. Preserved contours of the urinary bladder. Stomach/Bowel: On this non oral contrast exam the large bowel has a normal course and caliber. Mild scattered colonic stool. Normal appendix extends medial to the cecum in the right lower quadrant. The stomach and small bowel are nondilated. Vascular/Lymphatic: Normal caliber aorta and IVC. Mild atherosclerotic calcified plaque along the aorta and branch vessels. No specific abnormal lymph node enlargement seen in the abdomen and pelvis on this noncontrast exam. Reproductive: Prostate is  unremarkable. Other: Small left-sided fat inguinal hernia and umbilical hernia. No ascites or free air. Musculoskeletal: Scattered degenerative changes of the spine and pelvis. Particularly involving the right hip. There is a sclerotic lesion along the left iliac crest, unchanged going back to 2018 consistent with a benign lesion such as a bone island. IMPRESSION: Punctate nonobstructing lower pole right-sided renal stones. No left renal or bilateral ureteral stones. No collecting system dilatation. The findings by ultrasound may have been technical. Fatty liver infiltration. Electronically Signed   By: Jill Side M.D.   On: 11/10/2022 13:42   US RENAL  Result Date: 11/10/2022 CLINICAL DATA:  Acute kidney injury EXAM: RENAL / URINARY TRACT ULTRASOUND COMPLETE COMPARISON:  Renal ultrasound dated January 12, 2022 FINDINGS: Right Kidney: Renal measurements: 12.5 x 5.8 x 5.5 cm = volume: 207.6 mL. Echogenicity within normal limits. Probable moderate left hydronephrosis. Left Kidney: Renal measurements: 11.3 x 5.7 x 5.7 cm = volume: 190.2 mL. Echogenicity within normal limits. No mass or hydronephrosis visualized. Bladder: Appears normal for degree of bladder distention. Prevoid volume is 431.5 mL. Other: Poor visualization of the bilateral kidneys due to patient body habitus IMPRESSION: Probable moderate left hydronephrosis, poor visualization of the left kidney somewhat limits evaluation. Recommend CT for further evaluation. Electronically Signed   By: Yetta Glassman M.D.   On: 11/10/2022 09:05   CT Head Wo Contrast  Result Date: 11/10/2022 CLINICAL DATA:  New onset headaches EXAM: CT HEAD WITHOUT CONTRAST TECHNIQUE: Contiguous axial images were obtained from the base of the skull through the vertex without intravenous contrast. RADIATION DOSE REDUCTION: This exam was performed according to the departmental dose-optimization program which includes automated exposure control, adjustment of the mA and/or kV  according to patient size and/or use of iterative reconstruction technique. COMPARISON:  08/15/2022 FINDINGS: Brain: No evidence of acute infarction, hemorrhage, hydrocephalus, extra-axial collection or mass lesion/mass effect. Vascular: No hyperdense vessel or unexpected calcification. Skull: Normal. Negative for fracture or focal lesion. Sinuses/Orbits: No acute finding. Other: None. IMPRESSION: No acute intracranial abnormality noted. Electronically Signed   By: Inez Catalina M.D.   On: 11/10/2022 00:52    Microbiology: Results for orders placed or performed during the hospital encounter of 11/09/22  MRSA Next Gen by PCR, Nasal     Status: None   Collection Time: 11/10/22  6:06 PM   Specimen: Nasal Mucosa; Nasal Swab  Result Value Ref Range Status   MRSA by PCR Next Gen NOT DETECTED NOT DETECTED Final    Comment: (NOTE)  The GeneXpert MRSA Assay (FDA approved for NASAL specimens only), is one component of a comprehensive MRSA colonization surveillance program. It is not intended to diagnose MRSA infection nor to guide or monitor treatment for MRSA infections. Test performance is not FDA approved in patients less than 98 years old. Performed at [facility], [place] [address] [place], [place] [address]     Labs: CBC: Recent Labs  Lab 11/21/22 1639 11/21/22 2011 11/21/22 2309 11/22/22 0405 11/22/22 0551 11/23/22 0806  WBC 8.6  --   --   --   --  6.6  NEUTROABS 5.3  --   --   --   --   --   HGB 9.6* 9.4* 9.3* 9.3* 9.6* 9.6*  HCT 30.3* 29.5* 28.6* 29.3* 29.2* 30.7*  MCV 98.1  --   --   --   --  100.7*  PLT 219  --   --   --   --  159   Basic Metabolic Panel: Recent Labs  Lab 11/21/22 1639 11/22/22 0405 11/23/22 0806  NA 138 137 137  K 4.3 4.8 4.4  CL 104 104 104  CO2 24 21* 23  GLUCOSE 149* 126* 170*  BUN 43* 38* 29*  CREATININE 2.18* 2.01* 1.13  CALCIUM 8.9 8.9 9.2   Liver Function Tests: Recent Labs  Lab 11/21/22 1639  AST 23  ALT 25  ALKPHOS  29*  BILITOT 0.4  PROT 6.6  ALBUMIN 3.9   CBG: Recent Labs  Lab 11/22/22 1118 11/22/22 1624 11/22/22 2120 11/23/22 0613 11/23/22 0740  GLUCAP 150* 186* 229* 170* 183*    Discharge time spent: greater than 30 minutes.  Signed: Murray Hodgkins, MD [facility] 11/23/2022

## 2022-12-19 ENCOUNTER — Ambulatory Visit: Payer: Medicaid Other | Admitting: Podiatry

## 2022-12-19 ENCOUNTER — Encounter: Payer: Self-pay | Admitting: Podiatry

## 2022-12-19 VITALS — BP 165/76 | HR 81

## 2022-12-19 DIAGNOSIS — M79674 Pain in right toe(s): Secondary | ICD-10-CM | POA: Diagnosis not present

## 2022-12-19 DIAGNOSIS — M79675 Pain in left toe(s): Secondary | ICD-10-CM | POA: Diagnosis not present

## 2022-12-19 DIAGNOSIS — B351 Tinea unguium: Secondary | ICD-10-CM

## 2022-12-19 DIAGNOSIS — E1142 Type 2 diabetes mellitus with diabetic polyneuropathy: Secondary | ICD-10-CM

## 2022-12-19 NOTE — Progress Notes (Signed)
This patient presents  to my office for at risk foot care.  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  This patient presents for at risk foot care today.  General Appearance  Alert, conversant and in no acute stress.  Vascular  Dorsalis pedis   pulses are absent bilaterally. Posterior tibial pulses are absent due to swelling. Capillary return is within normal limits  bilaterally. Temperature is within normal limits  bilaterally.  Venous stasis  B/L.  Neurologic  Senn-Weinstein monofilament wire test within normal limits  bilaterally. Muscle power within normal limits bilaterally.  Nails Thick disfigured discolored nails with subungual debris  from hallux to fifth toes bilaterally. No evidence of bacterial infection or drainage bilaterally.  Orthopedic  No limitations of motion  feet .  No crepitus or effusions noted.  No bony pathology or digital deformities noted.  Skin  dry skin with no porokeratosis noted bilaterally.  No signs of infections or ulcers noted.     Onychomycosis  Pain in right toes  Pain in left toes  Consent was obtained for treatment procedures.   Mechanical debridement of nails 1-5  bilaterally performed with a nail nipper.  Filed with dremel without incident.    Return office visit    3 months                 Told patient to return for periodic foot care and evaluation due to potential at risk complications.   Gardiner Barefoot DPM

## 2023-01-02 ENCOUNTER — Other Ambulatory Visit: Payer: Self-pay | Admitting: Internal Medicine

## 2023-01-04 ENCOUNTER — Encounter (HOSPITAL_BASED_OUTPATIENT_CLINIC_OR_DEPARTMENT_OTHER): Payer: Medicaid Other | Attending: Internal Medicine | Admitting: Internal Medicine

## 2023-01-04 DIAGNOSIS — I2510 Atherosclerotic heart disease of native coronary artery without angina pectoris: Secondary | ICD-10-CM | POA: Insufficient documentation

## 2023-01-04 DIAGNOSIS — I890 Lymphedema, not elsewhere classified: Secondary | ICD-10-CM | POA: Insufficient documentation

## 2023-01-04 DIAGNOSIS — Z794 Long term (current) use of insulin: Secondary | ICD-10-CM | POA: Insufficient documentation

## 2023-01-04 DIAGNOSIS — I110 Hypertensive heart disease with heart failure: Secondary | ICD-10-CM | POA: Diagnosis not present

## 2023-01-04 DIAGNOSIS — E11628 Type 2 diabetes mellitus with other skin complications: Secondary | ICD-10-CM | POA: Insufficient documentation

## 2023-01-04 DIAGNOSIS — I87303 Chronic venous hypertension (idiopathic) without complications of bilateral lower extremity: Secondary | ICD-10-CM | POA: Insufficient documentation

## 2023-01-04 DIAGNOSIS — E1142 Type 2 diabetes mellitus with diabetic polyneuropathy: Secondary | ICD-10-CM | POA: Insufficient documentation

## 2023-01-04 DIAGNOSIS — I5032 Chronic diastolic (congestive) heart failure: Secondary | ICD-10-CM | POA: Diagnosis not present

## 2023-01-05 NOTE — Progress Notes (Signed)
[name] ([id number]) [id number] Page 1 of 7 Visit Report for 01/04/2023 Chief Complaint Document Details Patient Name: Date of Service: [name], [name] 01/04/2023 9:00 A M Medical Record Number: [id number] Patient Account Number: [id number] Date of Birth/Sex: Treating RN: [birth year] (62 y.o. M) Primary Care Provider: Kem Boroughs Other Clinician: Referring Provider: Treating Provider/Extender: Marya Fossa in Treatment: 0 Information Obtained from: Patient Chief Complaint 08/16/2021: Bilateral lower extremity scattered blisters. 1 small open wound limited to skin breakdown on the right lower extremity. 08/23/2021; 1 small open wound limited to skin breakdown on the left lower extremity 01/27/2022; patient with bilateral lower extremity wounds Electronic Signature(s) Signed: 01/04/2023 11:58:37 AM By: [name] DO Entered By: [name] on 01/04/2023 10:37:52 -------------------------------------------------------------------------------- HPI Details Patient Name: Date of Service: [name], [name] 01/04/2023 9:00 [place] Record Number: [id number] Patient Account Number: [id number] Date of Birth/Sex: Treating RN: 06/15/1961 (62 y.o. M) Primary Care Provider: Kem Boroughs Other Clinician: Referring Provider: Treating Provider/Extender: Marya Fossa in Treatment: 0 History of Present Illness HPI Description: Admission 08/16/2021 [name] is a 62 year old male with a past medical history of CVA, hypertension, uncontrolled type 2 diabetes on insulin complicated by peripheral neuropathy, Chronic venous insufficiency/lymphedema, diastolic CHF, and left lower extremity DVT that presents to clinic today for an open wound to his right lower extremity. He reports an ongoing issue with blistering to his legs and weeping that wax and wane over the past 6 months. He was  previously seen in our clinic in June 2022 however he had closed wounds at that time. He reported that his diuretic was increased then and helped with symptom control. About 2 months ago he developed increased blistering and swelling again. He reports wearing 20/30 compression stockings however it is unclear if he is doing this daily since he reports the stockings are very difficult to put on. He currently denies signs of infection. 10/31; patient presents for follow-up. He has tolerated the compression wraps well. The right leg wound has healed. He does have a small open wound now present on the left anterior lower extremity limited to skin breakdown. He would like to continue wrapping both legs. He states he cannot afford juxta lite compression and would like to use his compression stockings he currently owns. He denies signs of infection. 11/7; patient presents for follow-up. He has no issues or complaints today. While in office he scratched With his fingers at his right leg and now has a small open wound. He continues to have a left lower extremity wound. He tolerated the wraps well. He has his compression stockings today. He denies signs of infection. 11/14; patient presents for follow-up. He has his compression stockings today. He has no issues or complaints. Readmission 01/27/2022 [name] is a 62 year old male with a past medical history of CVA, hypertension, uncontrolled type 2 diabetes on insulin complicated by peripheral neuropathy, chronic venous insufficiency/lymphedema, diastolic CHF and left lower extremity DVT that presents to the clinic for bilateral lower extremity wounds. He states this started less than a week ago after scrubbing his legs in the shower. He has compression stockings that he reports using daily. He has been keeping the wounds covered. He denies signs of infection. 4/13; patient returns to clinic today for treatment of his chronic venous insufficiency and  lymphedema wounds on the left anterior mid tibia and the right posterior calf. We have been using silver alginate 4/20; patient presents for follow-up.  He has his compression stockings today. He has no issues or complaints. Admission 05/20/2022 [name] is a 62 year old male with a past medical history of uncontrolled type 2 diabetes on insulin, venous insufficiency/lymphedema and diastolic congestive heart failure that presents to the clinic for a 1 week history scattered open wounds to his lower extremities bilaterally. He states he has been wearing compression stockings. He has been keeping the areas covered. He denies signs of infection. 8/4; patient presents for follow-up. We have been using silver alginate under 4-layer compression to lower extremities bilaterally. He has tolerated this well. He has no issues or complaints today. He has his compression stockings with him. 8/11; Patient presents for follow-up. We have been using silver alginate under 4-layer compression. He has no issues or complaints today. He has his compression stockings with him. HARPAL, SUMMERSON ([id number]) [id number] Page 2 of 7 01/04/2023 [name] is a 62 year old male with a past medical history of type 2 diabetes, venous insufficiency/lymphedema that presents the clinic for concern of wounds to his left lower extremity. He has been scratching his legs and noted some bleeding. Fortunately he does not have any open wounds today. He has been wearing his compression stockings daily. Electronic Signature(s) Signed: 01/04/2023 11:58:37 AM By: [name] DO Entered By: [name] on 01/04/2023 10:38:37 -------------------------------------------------------------------------------- Physical Exam Details Patient Name: Date of Service: [name], [name] 01/04/2023 9:00 A M Medical Record Number: [id number] Patient Account Number: [id number] Date of Birth/Sex:  Treating RN: 11-12-1960 (62 y.o. M) Primary Care Provider: Kem Boroughs Other Clinician: Referring Provider: Treating Provider/Extender: Marya Fossa in Treatment: 0 Constitutional respirations regular, non-labored and within target range for patient.. Cardiovascular 2+ dorsalis pedis/posterior tibialis pulses. Psychiatric pleasant and cooperative. Notes Dry skin to the lower extremities bilaterally. No open wounds. Lymphedema skin changes noted. Electronic Signature(s) Signed: 01/04/2023 11:58:37 AM By: [name] DO Entered By: [name] on 01/04/2023 10:39:07 -------------------------------------------------------------------------------- Physician Orders Details Patient Name: Date of Service: [name], Laurance L. 01/04/2023 9:00 A M Medical Record Number: [id number] Patient Account Number: [id number] Date of Birth/Sex: Treating RN: 05-27-1961 (62 y.o. [name] Primary Care Provider: Kem Boroughs Other Clinician: Referring Provider: Treating Provider/Extender: Marya Fossa in Treatment: 0 Verbal / Phone Orders: No Diagnosis Coding ICD-10 Coding Code Description I89.0 Lymphedema, not elsewhere classified Discharge From [facility] Discharge from [place] - Call if any future wound care needs. Edema Control - Lymphedema / SCD / Other Elevate legs to the level of the heart or above for 30 minutes daily and/or when sitting for 3-4 times a day throughout the day. Avoid standing for long periods of time. Patient to wear own compression stockings every day. Exercise regularly Moisturize legs daily. Compression stocking or Garment 30-40 mm/Hg pressure to: - apply in the morning and remove at night. Electronic Signature(s) Signed: 01/04/2023 11:58:37 AM By: [name] DO Entered By: [name] on 01/04/2023 10:39:14 [name] ([id number] Page 3 of 7 -------------------------------------------------------------------------------- Problem List Details Patient Name: Date of Service: ZUBAYR, LUNDRIGAN 01/04/2023 9:00 A M Medical Record Number: [id number] Patient Account Number: [id number] Date of Birth/Sex: Treating RN: [birth year] (62 y.o. [name] Primary Care Provider: Kem Boroughs Other Clinician: Referring Provider: Treating Provider/Extender: Marya Fossa in Treatment: 0 Active Problems ICD-10 Encounter Code Description Active Date MDM Diagnosis I89.0 Lymphedema, not elsewhere classified 01/04/2023 No Yes I87.303 Chronic venous hypertension (idiopathic) without complications of  bilateral 01/04/2023 No Yes lower extremity E11.628 Type 2 diabetes mellitus with other skin complications [id number] No Yes Inactive Problems Resolved Problems Electronic Signature(s) Signed: 01/04/2023 11:58:37 AM By: [name] DO Entered By: [name] on 01/04/2023 10:37:08 -------------------------------------------------------------------------------- Progress Note Details Patient Name: Date of Service: [name], Bodhi L. 01/04/2023 9:00 A M Medical Record Number: [id number] Patient Account Number: [id number] Date of Birth/Sex: Treating RN: July 08, 1961 (62 y.o. M) Primary Care Provider: Kem Boroughs Other Clinician: Referring Provider: Treating Provider/Extender: Marya Fossa in Treatment: 0 Subjective Chief Complaint Information obtained from Patient 08/16/2021: Bilateral lower extremity scattered blisters. 1 small open wound limited to skin breakdown on the right lower extremity. 08/23/2021; 1 small open wound limited to skin breakdown on the left lower extremity 01/27/2022; patient with bilateral lower extremity wounds History of Present Illness (HPI) Admission 08/16/2021 Mr. 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 He reports an ongoing issue with blistering to his legs and weeping that wax and wane over the past 6 months. He was previously seen in our clinic in June 2022 however he had closed wounds at that time. He reported that his diuretic was increased then and helped with symptom control. About 2 months ago he developed increased blistering and swelling again. He reports wearing 20/30 compression stockings however it is unclear if he is doing this daily since he reports the stockings are very difficult to put on. He currently denies signs of infection. 10/31; patient presents for follow-up. He has tolerated the compression wraps well. The right leg wound has healed. He does have a small open wound now present on the left anterior lower extremity limited to skin breakdown. He would like to continue wrapping both legs. He states he cannot afford juxta lite compression and would like to use his compression stockings he currently owns. He denies signs of infection. 11/7; patient presents for follow-up. He has no issues or complaints today. While in office he scratched With his fingers at his right leg and now has a small open wound. He continues to have a left lower extremity wound. He tolerated the wraps well. He has his compression stockings today. He denies signs of infection. 11/14; patient presents for follow-up. He has his compression stockings today. He has no issues or complaints. Readmission 01/27/2022 Mr. Joss Rothermel is a 62 year old male with a past medical history of CVA, hypertension, uncontrolled type 2 diabetes on insulin complicated by peripheral THIRY, Naquan L ([id number])  [id number] Page 4 of 7 neuropathy, chronic venous insufficiency/lymphedema, diastolic CHF and left lower extremity DVT that presents to the clinic for bilateral lower extremity wounds. He states this started less than a week ago after scrubbing his legs in the shower. He has compression stockings that he reports using daily. He has been keeping the wounds covered. He denies signs of infection. 4/13; patient returns to clinic today for treatment of his chronic venous insufficiency and lymphedema wounds on the left anterior mid tibia and the right posterior calf. We have been using silver alginate 4/20; patient presents for follow-up. He has his compression stockings today. He has no issues or complaints. Admission 05/20/2022 Mr. Mosses Mcdill is a 62 year old male with a past medical history of uncontrolled type 2 diabetes on insulin, venous  insufficiency/lymphedema and diastolic congestive heart failure that presents to the clinic for a 1 week history scattered open wounds to his lower extremities bilaterally. He states he has been wearing compression stockings. He has been keeping the areas covered. He denies signs of infection. 8/4; patient presents for follow-up. We have been using silver alginate under 4-layer compression to lower extremities bilaterally. He has tolerated this well. He has no issues or complaints today. He has his compression stockings with him. 8/11; Patient presents for follow-up. We have been using silver alginate under 4-layer compression. He has no issues or complaints today. He has his compression stockings with him. 01/04/2023 Mr. Errett. Sveum is a 62 year old male with a past medical history of type 2 diabetes, venous insufficiency/lymphedema that presents the clinic for concern of wounds to his left lower extremity. He has been scratching his legs and noted some bleeding. Fortunately he does not have any open wounds today. He has been  wearing his compression stockings daily. Patient History Unable to Obtain Patient History due to Altered Mental Status. Information obtained from Patient. Allergies ibuprofen, Aleve, NSAIDS (Non-Steroidal Anti-Inflammatory Drug), Vascepa, coconut flavor Family History Cancer - Mother,Father, Diabetes - Mother,Siblings, Hypertension - Siblings,Mother, Seizures - Mother,Siblings, Stroke - Siblings, No family history of Heart Disease, Hereditary Spherocytosis, Kidney Disease, Lung Disease, Thyroid Problems, Tuberculosis. Social History Never smoker, Marital Status - Divorced, Alcohol Use - Rarely, Drug Use - No History, Caffeine Use - Daily. Medical History Eyes Denies history of Cataracts, Glaucoma, Optic Neuritis Ear/Nose/Mouth/Throat Denies history of Chronic sinus problems/congestion, Middle ear problems Hematologic/Lymphatic Patient has history of Lymphedema Denies history of Anemia, Hemophilia, Human Immunodeficiency Virus, Sickle Cell Disease Respiratory Denies history of Aspiration, Asthma, Chronic Obstructive Pulmonary Disease (COPD), Pneumothorax, Sleep Apnea, Tuberculosis Cardiovascular Patient has history of Congestive Heart Failure, Coronary Artery Disease, Deep Vein Thrombosis, Hypertension, Hypotension Denies history of Angina, Arrhythmia, Myocardial Infarction, Peripheral Arterial Disease, Peripheral Venous Disease, Phlebitis, Vasculitis Gastrointestinal Denies history of Cirrhosis , Colitis, Crohnoos, Hepatitis A, Hepatitis B, Hepatitis C Endocrine Patient has history of Type II Diabetes Denies history of Type I Diabetes Genitourinary Denies history of End Stage Renal Disease Immunological Denies history of Lupus Erythematosus, Raynaudoos, Scleroderma Integumentary (Skin) Denies history of History of Burn Musculoskeletal Denies history of Gout, Rheumatoid Arthritis, Osteoarthritis, Osteomyelitis Neurologic Patient has history of Neuropathy Denies history of  Dementia, Quadriplegia, Paraplegia, Seizure Disorder Oncologic Denies history of Received Chemotherapy, Received Radiation Psychiatric Denies history of Anorexia/bulimia, Confinement Anxiety Hospitalization/Surgery History - inpatient PE 06/2021. - 01/13/2022 low BP. - Gi bleed in january 2024 tear in colon. Medical A Surgical History Notes nd Respiratory PE September 2022 02 dependent at night PE to left lung June 2023 Gastrointestinal Gi bleed in january 2024 tear in colon JAHCARI, STOLZENBURG L ([id number]) [phone].pdf Page 5 of 7 Objective Constitutional respirations regular, non-labored and within target range for patient.. Vitals Time Taken: 9:59 AM, Height: 70 in, Source: Stated, Weight: 320 lbs, Source: Stated, BMI: 45.9, Temperature: 98.3 F, Pulse: 89 bpm, Respiratory Rate: 20 breaths/min, Blood Pressure: 167/90 mmHg, Capillary Blood Glucose: 180 mg/dl. Cardiovascular 2+ dorsalis pedis/posterior tibialis pulses. Psychiatric pleasant and cooperative. General Notes: Dry skin to the lower extremities bilaterally. No open wounds. Lymphedema skin changes noted. Assessment Active Problems ICD-10 Lymphedema, not elsewhere classified Chronic venous hypertension (idiopathic) without complications of bilateral lower extremity Type 2 diabetes mellitus with other skin complications Patient has a history of lymphedema/chronic venous insufficiency and there was concern for potential wounds to his left posterior lower extremity. He  has been scratching his legs because they are dry and has had a little bit of bleeding. Fortunately he has no open wounds. I recommended using lotion daily to his legs and wearing his compression socks. Follow-up as needed. Plan Discharge From [facility]: Discharge from [place] - Call if any future wound care needs. Edema Control - Lymphedema / SCD / Other: Elevate legs to the level of the heart or above for 30 minutes daily  and/or when sitting for 3-4 times a day throughout the day. Avoid standing for long periods of time. Patient to wear own compression stockings every day. Exercise regularly Moisturize legs daily. Compression stocking or Garment 30-40 mm/Hg pressure to: - apply in the morning and remove at night. 1. Compression stockings daily 2. Follow-up as needed Electronic Signature(s) Signed: 01/04/2023 11:58:37 AM By: [name] DO Entered By: [name] on 01/04/2023 10:41:08 -------------------------------------------------------------------------------- HxROS Details Patient Name: Date of Service: [name], Japheth L. 01/04/2023 9:00 A M Medical Record Number: [id number] Patient Account Number: [id number] Date of Birth/Sex: Treating RN: 1961-05-19 (62 y.o. [name] Primary Care Provider: Kem Boroughs Other Clinician: Referring Provider: Treating Provider/Extender: Marya Fossa in Treatment: 0 Unable to Obtain Patient History due to Altered Mental Status Information Obtained From Patient GUSTAF, TACHIBANA ([id number]) [id number].pdf Page 6 of 7 Eyes Medical History: Negative for: Cataracts; Glaucoma; Optic Neuritis Ear/Nose/Mouth/Throat Medical History: Negative for: Chronic sinus problems/congestion; Middle ear problems Hematologic/Lymphatic Medical History: Positive for: Lymphedema Negative for: Anemia; Hemophilia; Human Immunodeficiency Virus; Sickle Cell Disease Respiratory Medical History: Negative for: Aspiration; Asthma; Chronic Obstructive Pulmonary Disease (COPD); Pneumothorax; Sleep Apnea; Tuberculosis Past Medical History Notes: PE September 2022 02 dependent at night PE to left lung June 2023 Cardiovascular Medical History: Positive for: Congestive Heart Failure; Coronary Artery Disease; Deep Vein Thrombosis; Hypertension; Hypotension Negative for: Angina; Arrhythmia; Myocardial Infarction;  Peripheral Arterial Disease; Peripheral Venous Disease; Phlebitis; Vasculitis Gastrointestinal Medical History: Negative for: Cirrhosis ; Colitis; Crohns; Hepatitis A; Hepatitis B; Hepatitis C Past Medical History Notes: Gi bleed in january 2024 tear in colon Endocrine Medical History: Positive for: Type II Diabetes Negative for: Type I Diabetes Time with diabetes: 11 years Treated with: Insulin, Oral agents Blood sugar tested every day: Yes Tested : TID Blood sugar testing results: Breakfast: 200 Genitourinary Medical History: Negative for: End Stage Renal Disease Immunological Medical History: Negative for: Lupus Erythematosus; Raynauds; Scleroderma Integumentary (Skin) Medical History: Negative for: History of Burn Musculoskeletal Medical History: Negative for: Gout; Rheumatoid Arthritis; Osteoarthritis; Osteomyelitis Neurologic Medical History: Positive for: Neuropathy Negative for: Dementia; Quadriplegia; Paraplegia; Seizure Disorder Oncologic Medical History: Negative for: Received Chemotherapy; Received Radiation DENI, CARWELL ([id number]) [id number] Page 7 of 7 Psychiatric Medical History: Negative for: Anorexia/bulimia; Confinement Anxiety Immunizations Pneumococcal Vaccine: Received Pneumococcal Vaccination: No Implantable Devices None Hospitalization / Surgery History Type of Hospitalization/Surgery inpatient PE 06/2021 01/13/2022 low BP Gi bleed in january 2024 tear in colon Family and Social History Cancer: Yes - Mother,Father; Diabetes: Yes - Mother,Siblings; Heart Disease: No; Hereditary Spherocytosis: No; Hypertension: Yes - Siblings,Mother; Kidney Disease: No; Lung Disease: No; Seizures: Yes - Mother,Siblings; Stroke: Yes - Siblings; Thyroid Problems: No; Tuberculosis: No; Never smoker; Marital Status - Divorced; Alcohol Use: Rarely; Drug Use: No History; Caffeine Use: Daily; Financial Concerns: No; Food, Clothing or  Shelter Needs: No; Support System Lacking: No; Transportation Concerns: No Electronic Signature(s) Signed: 01/04/2023 11:58:37 AM By: [name] DO Signed: 01/04/2023 5:26:11 PM By: Deon Pilling RN, BSN Entered By: Deon Pilling on  01/04/2023 10:03:20 -------------------------------------------------------------------------------- SuperBill Details Patient Name: Date of Service: KHRISTOPHER, GONNERMAN 01/04/2023 Medical Record Number: [id number] Patient Account Number: [id number] Date of Birth/Sex: Treating RN: May 22, 1961 (62 y.o. [name] Primary Care Provider: Kem Boroughs Other Clinician: Referring Provider: Treating Provider/Extender: Marya Fossa in Treatment: 0 Diagnosis Coding ICD-10 Codes Code Description I89.0 Lymphedema, not elsewhere classified I87.303 Chronic venous hypertension (idiopathic) without complications of bilateral lower extremity E11.628 Type 2 diabetes mellitus with other skin complications Facility Procedures : CPT4 Code: [id number] Description: 99214 - WOUND CARE VISIT-LEV 4 EST PT Modifier: Quantity: 1 Physician Procedures : CPT4 Code Description Modifier [id number] - WC PHYS LEVEL 3 - EST PT ICD-10 Diagnosis Description I89.0 Lymphedema, not elsewhere classified I87.303 Chronic venous hypertension (idiopathic) without complications of bilateral lower extremity E11.628  Type 2 diabetes mellitus with other skin complications Quantity: 1 Electronic Signature(s) Signed: 01/04/2023 11:58:37 AM By: [name] DO Entered By: [name] on 01/04/2023 10:41:17

## 2023-01-05 NOTE — Progress Notes (Signed)
HARREY, CENTRONE ([id number]) [id number] Page 1 of 7 Visit Report for 01/04/2023 Allergy List Details Patient Name: Date of Service: [name], [name] 01/04/2023 9:00 A M Medical Record Number: [id number] Patient Account Number: [id number] Date of Birth/Sex: Treating RN: 03-May-1961 (62 y.o. [name] Primary Care [name]: [name] Other Clinician: Referring [name]: Treating [name]/Extender: Marya Stein in Treatment: 0 Allergies Active Allergies ibuprofen Aleve NSAIDS (Non-Steroidal Anti-Inflammatory Drug) Vascepa coconut flavor Allergy Notes Electronic Signature(s) Signed: 01/04/2023 5:26:11 PM By: Deon Pilling RN, BSN Entered By: Deon Pilling on 01/04/2023 10:01:44 -------------------------------------------------------------------------------- Arrival Information Details Patient Name: Date of Service: [name], [name]. 01/04/2023 9:00 A M Medical Record Number: [id number] Patient Account Number: [id number] Date of Birth/Sex: Treating RN: 11/15/1960 (62 y.o. [name] Primary Care [name]: [name] Other Clinician: Referring [name]: Treating [name]/Extender: Marya Stein in Treatment: 0 Visit Information Patient Arrived: Ambulatory Arrival Time: 09:59 Accompanied By: SELF Transfer Assistance: None Patient Identification Verified: Yes Secondary Verification Process Completed: Yes Patient Requires Transmission-Based Precautions: No Patient Has Alerts: No History Since Last Visit Added or deleted any medications: No Any new allergies or adverse reactions: No Had a fall or experienced change in activities of daily living that may affect risk of falls: No Signs or symptoms of abuse/neglect since last visito No Hospitalized since last visit: Yes Implantable device outside of the clinic excluding cellular tissue based products placed in the center  since last visit: No Has Dressing in Place as Prescribed: Yes Has Compression in Place as Prescribed: No Electronic Signature(s) Signed: 01/04/2023 5:26:11 PM By: Deon Pilling RN, BSN Entered By: Deon Pilling on 01/04/2023 10:01:04 -------------------------------------------------------------------------------- Clinic Level of Care Assessment Details Patient Name: Date of Service: [name]. 01/04/2023 9:00 [place] Record Number: [id number] Patient Account Number: [id number] Date of Birth/Sex: Treating RN: Aug 08, 1961 (62 y.o. Bruk, Chopin, [name] ([id number]) [phone].pdf Page 2 of 7 Primary Care Jaysion Ramseyer: [name] Other Clinician: Referring Sierah Lacewell: Treating [name]/Extender: Marya Stein in Treatment: 0 Clinic Level of Care Assessment Items TOOL 2 Quantity Score X- 1 0 Use when only an EandM is performed on the INITIAL visit ASSESSMENTS - Nursing Assessment / Reassessment X- 1 20 General Physical Exam (combine w/ comprehensive assessment (listed just below) when performed on new pt. evals) X- 1 25 Comprehensive Assessment (HX, ROS, Risk Assessments, Wounds Hx, etc.) ASSESSMENTS - Wound and Skin A ssessment / Reassessment '[]'$  - 0 Simple Wound Assessment / Reassessment - one wound '[]'$  - 0 Complex Wound Assessment / Reassessment - multiple wounds X- 1 10 Dermatologic / Skin Assessment (not related to wound area) ASSESSMENTS - Ostomy and/or Continence Assessment and Care '[]'$  - 0 Incontinence Assessment and Management '[]'$  - 0 Ostomy Care Assessment and Management (repouching, etc.) PROCESS - Coordination of Care X - Simple Patient / Family Education for ongoing care 1 15 '[]'$  - 0 Complex (extensive) Patient / Family Education for ongoing care X- 1 10 Staff obtains Programmer, systems, Records, T Results / Process Orders est '[]'$  - 0 Staff telephones HHA, Nursing Homes / Clarify orders / etc '[]'$  -  0 Routine Transfer to another Facility (non-emergent condition) '[]'$  - 0 Routine Hospital Admission (non-emergent condition) '[]'$  - 0 New Admissions / Biomedical engineer / Ordering NPWT Apligraf, etc. , '[]'$  - 0 Emergency Hospital Admission (emergent condition) X- 1 10 Simple Discharge Coordination '[]'$  - 0 Complex (extensive) Discharge Coordination PROCESS - Special Needs '[]'$  - 0 Pediatric /  Minor Patient Management '[]'$  - 0 Isolation Patient Management '[]'$  - 0 Hearing / Language / Visual special needs '[]'$  - 0 Assessment of Community assistance (transportation, D/C planning, etc.) '[]'$  - 0 Additional assistance / Altered mentation '[]'$  - 0 Support Surface(s) Assessment (bed, cushion, seat, etc.) INTERVENTIONS - Wound Cleansing / Measurement '[]'$  - 0 Wound Imaging (photographs - any number of wounds) '[]'$  - 0 Wound Tracing (instead of photographs) '[]'$  - 0 Simple Wound Measurement - one wound '[]'$  - 0 Complex Wound Measurement - multiple wounds '[]'$  - 0 Simple Wound Cleansing - one wound '[]'$  - 0 Complex Wound Cleansing - multiple wounds INTERVENTIONS - Wound Dressings '[]'$  - 0 Small Wound Dressing one or multiple wounds '[]'$  - 0 Medium Wound Dressing one or multiple wounds '[]'$  - 0 Large Wound Dressing one or multiple wounds '[]'$  - 0 Application of Medications - injection [name] ([id number] Page 3 of 7 INTERVENTIONS - Miscellaneous '[]'$  - 0 External ear exam '[]'$  - 0 Specimen Collection (cultures, biopsies, blood, body fluids, etc.) '[]'$  - 0 Specimen(s) / Culture(s) sent or taken to Lab for analysis '[]'$  - 0 Patient Transfer (multiple staff / Harrel Lemon Lift / Similar devices) '[]'$  - 0 Simple Staple / Suture removal (25 or less) '[]'$  - 0 Complex Staple / Suture removal (26 or more) '[]'$  - 0 Hypo / Hyperglycemic Management (close monitor of Blood Glucose) '[]'$  - 0 Ankle / Brachial Index (ABI) - do not check if billed separately Has the patient been seen  at the hospital within the last three years: Yes Total Score: 90 Level Of Care: New/Established - Level 3 Electronic Signature(s) Signed: 01/04/2023 5:26:11 PM By: Deon Pilling RN, BSN Entered By: Deon Pilling on 01/04/2023 10:39:44 -------------------------------------------------------------------------------- Encounter Discharge Information Details Patient Name: Date of Service: [name], [name]. 01/04/2023 9:00 [place] Record Number: [id number] Patient Account Number: [id number] Date of Birth/Sex: Treating RN: [birth year] (62 y.o. [name] Primary Care Emerald Shor: [name] Other Clinician: Referring Dayanis Bergquist: Treating Keanu Lesniak/Extender: Marya Stein in Treatment: 0 Encounter Discharge Information Items Discharge Condition: Stable Ambulatory Status: Ambulatory Discharge Destination: Home Transportation: Private Auto Accompanied By: self Schedule Follow-up Appointment: No Clinical Summary of Care: Notes lotion applied to both legs. Electronic Signature(s) Signed: 01/04/2023 5:26:11 PM By: Deon Pilling RN, BSN Entered By: Deon Pilling on 01/04/2023 10:40:32 -------------------------------------------------------------------------------- Lower Extremity Assessment Details Patient Name: Date of Service: [name], [name]. 01/04/2023 9:00 A M Medical Record Number: [id number] Patient Account Number: [id number] Date of Birth/Sex: Treating RN: Sep 23, 1961 (62 y.o. [name] Primary Care Janeil Schexnayder: [name] Other Clinician: Referring Rome Schlauch: Treating Makalah Asberry/Extender: Norton Blizzard Weeks in Treatment: 0 Edema Assessment Assessed: [Left: Yes] [Right: Yes] Edema: [Left: Yes] [Right: Yes] Calf Left: Right: Point of Measurement: 33 cm From Medial Instep 49 cm 47 cm Ankle Stauch, Hamilton Stein ([id number]) [phone] Page 4 of 7 Left: Right: Point of Measurement: 11 cm From  Medial Instep 27 cm 26.5 cm Knee To Floor Left: Right: From Medial Instep 44 cm 44 cm Vascular Assessment Pulses: Dorsalis Pedis Palpable: [Left:Yes] [Right:Yes] Electronic Signature(s) Signed: 01/04/2023 5:26:11 PM By: Deon Pilling RN, BSN Entered By: Deon Pilling on 01/04/2023 10:06:28 -------------------------------------------------------------------------------- Multi Wound Chart Details Patient Name: Date of Service: [name], [name]. 01/04/2023 9:00 A M Medical Record Number: [id number] Patient Account Number: [id number] Date of Birth/Sex: Treating RN: January 24, 1961 (62 y.o. M) Primary Care Maleea Camilo: [name] Other Clinician: Referring Coe Angelos: Treating Dangela How/Extender: Judith Part,  Carmen Weeks in Treatment: 0 Vital Signs Height(in): 70 Capillary Blood Glucose(mg/dl): 180 Weight(lbs): 320 Pulse(bpm): 89 Body Mass Index(BMI): 45.9 Blood Pressure(mmHg): 167/90 Temperature(F): 98.3 Respiratory Rate(breaths/min): 20 [Treatment Notes:Wound Assessments Treatment Notes] Electronic Signature(s) Signed: 01/04/2023 11:58:37 AM By: [name] DO Entered By: [name] on 01/04/2023 10:37:18 -------------------------------------------------------------------------------- Multi-Disciplinary Care Plan Details Patient Name: Date of Service: [name], [name]. 01/04/2023 9:00 A M Medical Record Number: [id number] Patient Account Number: [id number] Date of Birth/Sex: Treating RN: 1961-05-06 (62 y.o. [name] Primary Care Dillie Burandt: [name] Other Clinician: Referring Raffaella Edison: Treating Lynessa Almanzar/Extender: Marya Stein in Treatment: 0 Active Inactive Electronic Signature(s) Signed: 01/04/2023 5:26:11 PM By: Deon Pilling RN, BSN Entered By: Deon Pilling on 01/04/2023 10:34:07 -------------------------------------------------------------------------------- Non-Wound Condition Assessment  Details Patient Name: Date of Service: [name], [name] 01/04/2023 9:00 [place] Record Number: [id number] Patient Account Number: [id number] [name], [name] ([id number]) [phone].pdf Page 5 of 7 Date of Birth/Sex: Treating RN: December 24, 1960 (62 y.o. [name] Primary Care Chantae Soo: Other Clinician: Kem Stein Referring Elana Jian: Treating Lowry Bala/Extender: Marya Stein in Treatment: 0 Non-Wound Condition: Condition: Lymphedema Location: Leg Side: Left Periwound Skin Texture Texture Color No Abnormalities Noted: No No Abnormalities Noted: No Callus: No Atrophie Blanche: No Crepitus: No Cyanosis: No Excoriation: No Ecchymosis: No Friable: No Erythema: No Induration: No Hemosiderin Staining: Yes Rash: No Mottled: No Scarring: Yes Pallor: No Rubor: No Moisture No Abnormalities Noted: No Dry / Scaly: Yes Maceration: No Notes cobblestone appearance to lower leg. Electronic Signature(s) Signed: 01/04/2023 5:26:11 PM By: Deon Pilling RN, BSN Entered By: Deon Pilling on 01/04/2023 10:07:32 -------------------------------------------------------------------------------- Non-Wound Condition Assessment Details Patient Name: Date of Service: [name], [name]. 01/04/2023 9:00 A M Medical Record Number: [id number] Patient Account Number: [id number] Date of Birth/Sex: Treating RN: 05-Nov-1960 (62 y.o. [name] Primary Care Caeleigh Prohaska: [name] Other Clinician: Referring Amica Harron: Treating Monteen Toops/Extender: Marya Stein in Treatment: 0 Non-Wound Condition: Condition: Lymphedema Location: Leg Side: Right Periwound Skin Texture Texture Color No Abnormalities Noted: No No Abnormalities Noted: No Callus: No Atrophie Blanche: No Crepitus: No Cyanosis: No Excoriation: No Ecchymosis: No Friable: No Erythema: No Induration: No Hemosiderin Staining: Yes Rash:  No Mottled: No Scarring: Yes Pallor: No Rubor: No Moisture No Abnormalities Noted: No Dry / Scaly: Yes Maceration: No Notes cobblestone appearance to lower leg. Electronic Signature(s) Signed: 01/04/2023 5:26:11 PM By: Deon Pilling RN, BSN Entered By: Deon Pilling on 01/04/2023 10:07:44 [name] ([id number].pdf Page 6 of 7 -------------------------------------------------------------------------------- Pain Assessment Details Patient Name: Date of Service: [name], [name] 01/04/2023 9:00 A M Medical Record Number: [id number] Patient Account Number: [id number] Date of Birth/Sex: Treating RN: 1961/07/10 (62 y.o. [name] Primary Care Zahava Quant: [name] Other Clinician: Referring Ercell Razon: Treating Delanee Xin/Extender: Marya Stein in Treatment: 0 Active Problems Location of Pain Severity and Description of Pain Patient Has Paino Yes Site Locations Pain Location: Generalized Pain Rate the pain. Current Pain Level: 7 Pain Management and Medication Current Pain Management: Medication: No Cold Application: No Rest: No Massage: No Activity: No T.E.N.S.: No Heat Application: No Leg drop or elevation: No Is the Current Pain Management Adequate: Adequate How does your wound impact your activities of daily livingo Sleep: No Bathing: No Appetite: No Relationship With Others: No Bladder Continence: No Emotions: No Bowel Continence: No Work: No Toileting: No Drive: No Dressing: No Hobbies: No Engineer, maintenance) Signed: 01/04/2023 5:26:11 PM By: Deon Pilling RN,  BSN Entered By: Deon Pilling on 01/04/2023 10:05:06 -------------------------------------------------------------------------------- Patient/Caregiver Education Details Patient Name: Date of Service: [name], [name] 3/13/2024andnbsp9:00 [place] Record Number: [id number] Patient Account Number: [id number] Date  of Birth/Gender: Treating RN: [birth year] (62 y.o. [name] Primary Care Physician: [name] Other Clinician: Referring Physician: Treating Physician/Extender: Marya Stein in Treatment: 0 Education Assessment Education Provided To: Patient Education Topics Provided Venous: SHANNA, RUGAR ([id number]) [id number] Page 7 of 7 Handouts: Controlling Swelling with Compression Stockings Methods: Explain/Verbal Responses: Reinforcements needed Electronic Signature(s) Signed: 01/04/2023 5:26:11 PM By: Deon Pilling RN, BSN Entered By: Deon Pilling on 01/04/2023 10:34:34 -------------------------------------------------------------------------------- Vitals Details Patient Name: Date of Service: [name], [name]. 01/04/2023 9:00 A M Medical Record Number: [id number] Patient Account Number: [id number] Date of Birth/Sex: Treating RN: 1960-12-21 (62 y.o. [name] Primary Care Lindora Alviar: [name] Other Clinician: Referring Roann Merk: Treating Keshan Reha/Extender: Marya Stein in Treatment: 0 Vital Signs Time Taken: 09:59 Temperature (F): 98.3 Height (in): 70 Pulse (bpm): 89 Source: Stated Respiratory Rate (breaths/min): 20 Weight (lbs): 320 Blood Pressure (mmHg): 167/90 Source: Stated Capillary Blood Glucose (mg/dl): 180 Body Mass Index (BMI): 45.9 Reference Range: 80 - 120 mg / dl Electronic Signature(s) Signed: 01/04/2023 5:26:11 PM By: Deon Pilling RN, BSN Entered By: Deon Pilling on 01/04/2023 10:03:00

## 2023-01-05 NOTE — Progress Notes (Signed)
[name], [name] ([id number]) [phone] [id number] Page 1 of 4 Visit Report for 01/04/2023 Abuse Risk Screen Details Patient Name: Date of Service: [name], [name] 01/04/2023 9:00 A M Medical Record Number: [id number] Patient Account Number: [id number] Date of Birth/Sex: Treating RN: [name] (62 y.o. [name] Primary Care [name]: Kem Boroughs Other Clinician: Referring Cenia Zaragosa: Treating Lacrecia Delval/Extender: [name] in Treatment: 0 Abuse Risk Screen Items Answer ABUSE RISK SCREEN: Has anyone close to you tried to hurt or harm you recentlyo No Do you feel uncomfortable with anyone in your familyo No Has anyone forced you do things that you didnt want to doo No Electronic Signature(s) Signed: 01/04/2023 5:26:11 PM By: Juan Pilling RN, BSN Entered By: [name] on 01/04/2023 10:03:25 -------------------------------------------------------------------------------- Activities of Daily Living Details Patient Name: Date of Service: [name], [name] 01/04/2023 9:00 [place] Record Number: [id number] Patient Account Number: [id number] Date of Birth/Sex: Treating RN: [name] (62 y.o. [name] Primary Care Hindy Perrault: Kem Boroughs Other Clinician: Referring Mahagony Grieb: Treating [name]/Extender: [name] in Treatment: 0 Activities of Daily Living Items Answer Activities of Daily Living (Please select one for each item) Drive Automobile Completely Able T Medications ake Completely Able Use T elephone Completely Able Care for Appearance Completely Able Use T oilet Completely Able Bath / Shower Completely Able Dress Self Completely Able Feed Self Completely Able Walk Completely Able Get In / Out Bed Completely Able Housework Completely Able Prepare Meals Completely [place] Completely Able Shop for Self Completely Able Electronic  Signature(s) Signed: 01/04/2023 5:26:11 PM By: Juan Pilling RN, BSN Entered By: [name] on 01/04/2023 10:03:40 -------------------------------------------------------------------------------- Education Screening Details Patient Name: Date of Service: [name], [name]. 01/04/2023 9:00 [place] Record Number: [id number] Patient Account Number: [id number] Date of Birth/Sex: Treating RN: [name] (62 y.o. [name] Primary Care Sierra Spargo: Kem Boroughs Other Clinician: Referring Mariusz Jubb: Treating [name]/Extender: [name] in Treatment: 0 [name] ([id number]) [name] [phone].pdf Page 2 of 4 Primary Learner Assessed: Patient Learning Preferences/Education Level/Primary Language Learning Preference: Explanation, Demonstration, Printed Material Highest Education Level: High School Preferred Language: Diplomatic Services operational officer Language Barrier: No Translator Needed: No Memory Deficit: No Emotional Barrier: No Cultural/Religious Beliefs Affecting Medical Care: No Physical Barrier Impaired Vision: No Impaired Hearing: No Decreased Hand dexterity: No Knowledge/Comprehension Knowledge Level: High Comprehension Level: High Ability to understand written instructions: High Ability to understand verbal instructions: High Motivation Anxiety Level: Calm Cooperation: Cooperative Education Importance: Acknowledges Need Interest in Health Problems: Asks Questions Perception: Coherent Willingness to Engage in Self-Management High Activities: Readiness to Engage in Self-Management High Activities: Electronic Signature(s) Signed: 01/04/2023 5:26:11 PM By: Juan Pilling RN, BSN Entered By: [name] on 01/04/2023 10:04:09 -------------------------------------------------------------------------------- Fall Risk Assessment Details Patient Name: Date of Service: [name], [name]. 01/04/2023 9:00 [place] Record Number: [id number] Patient Account Number: [id number] Date of Birth/Sex: Treating RN: [name], [name] (62 y.o. [name] Primary Care [name]: Kem Boroughs Other Clinician: Referring Desman Polak: Treating Dolton Shaker/Extender: [name] in Treatment: 0 Fall Risk Assessment Items Have you had 2 or more falls in the last 12 monthso 0 Yes Have you had any fall that resulted in injury in the last 12 monthso 0 Yes FALLS RISK SCREEN History of falling - immediate or within 3 months 25 Yes Secondary diagnosis (Do you have 2 or more medical diagnoseso) 0 No Ambulatory aid None/bed rest/wheelchair/nurse 0 Yes Crutches/cane/walker 0 No  Furniture 0 No Intravenous therapy Access/Saline/Heparin Lock 0 No Gait/Transferring Normal/ bed rest/ wheelchair 0 Yes Weak (short steps with or without shuffle, stooped but able to lift head while walking, may seek 0 No support from furniture) Impaired (short steps with shuffle, may have difficulty arising from chair, head down, impaired 0 No balance) Mental Status Oriented to own ability 0 Yes Overestimates or forgets limitations 0 No Risk Level: Medium Risk Score: 25 Signer, [name] ([id number]) [id number] Page 3 of 4 Electronic Signature(s) -------------------------------------------------------------------------------- Foot Assessment Details Patient Name: Date of Service: [name], [name] 01/04/2023 9:00 A M Medical Record Number: [id number] Patient Account Number: [id number] Date of Birth/Sex: Treating RN: [name] (62 y.o. [name] Primary Care Brandii Lakey: Kem Boroughs Other Clinician: Referring Demitrius Crass: Treating Saydee Zolman/Extender: [name] in Treatment: 0 Foot Assessment Items Site Locations + = Sensation present, - = Sensation absent, C = Callus, U = Ulcer R = Redness, W = Warmth, M = Maceration, PU =  Pre-ulcerative lesion F = Fissure, S = Swelling, D = Dryness Assessment Right: Left: Other Deformity: No No Prior Foot Ulcer: No No Prior Amputation: No No Charcot Joint: No No Ambulatory Status: Gait: Steady Notes NO BLE wounds. Electronic Signature(s) Signed: 01/04/2023 5:26:11 PM By: Juan Pilling RN, BSN Entered By: [name] on 01/04/2023 10:04:56 -------------------------------------------------------------------------------- Nutrition Risk Screening Details Patient Name: Date of Service: [name], [name] 01/04/2023 9:00 A M Medical Record Number: [id number] Patient Account Number: [id number] Date of Birth/Sex: Treating RN: [birth year] (62 y.o. [name] Primary Care Joyanne Eddinger: Kem Boroughs Other Clinician: Referring Tenita Cue: Treating Arash Karstens/Extender: [name] in Treatment: 0 Height (in): 70 Weight (lbs): 320 Body Mass Index (BMI): 45.9 [name], [name] ([id number]) [id number] Page 4 of 4 Nutrition Risk Screening Items Score Screening NUTRITION RISK SCREEN: I have an illness or condition that made me change the kind and/or amount of food I eat 2 Yes I eat fewer than two meals per day 0 No I eat few fruits and vegetables, or milk products 0 No I have three or more drinks of beer, liquor or wine almost every day 0 No I have tooth or mouth problems that make it hard for me to eat 0 No I don't always have enough money to buy the food I need 0 No I eat alone most of the time 0 No I take three or more different prescribed or over-the-counter drugs a day 1 Yes Without wanting to, I have lost or gained 10 pounds in the last six months 0 No I am not always physically able to shop, cook and/or feed myself 0 No Nutrition Protocols Good Risk Protocol Provide education on elevated blood Moderate Risk Protocol 0 sugars and impact on wound healing, as applicable High Risk Proctocol Risk Level:  Moderate Risk Score: 3 Electronic Signature(s) Signed: 01/04/2023 5:26:11 PM By: Juan Pilling RN, BSN Entered By: [name] on 01/04/2023 10:04:45

## 2023-01-26 ENCOUNTER — Telehealth: Payer: Self-pay

## 2023-01-26 NOTE — Telephone Encounter (Signed)
T/c from pt requesting a refill for his Eliquis.  Per Dr Hazeline Junker last office note pt is to f/up at [facility] if needed.  LM for pt to f/up with his PCP to manage Eliquis prescriptions

## 2023-01-28 DIAGNOSIS — G4733 Obstructive sleep apnea (adult) (pediatric): Secondary | ICD-10-CM | POA: Diagnosis not present

## 2023-01-28 DIAGNOSIS — R269 Unspecified abnormalities of gait and mobility: Secondary | ICD-10-CM | POA: Diagnosis not present

## 2023-01-28 DIAGNOSIS — J9601 Acute respiratory failure with hypoxia: Secondary | ICD-10-CM | POA: Diagnosis not present

## 2023-01-28 DIAGNOSIS — U071 COVID-19: Secondary | ICD-10-CM | POA: Diagnosis not present

## 2023-01-29 ENCOUNTER — Other Ambulatory Visit: Payer: Self-pay

## 2023-01-29 ENCOUNTER — Emergency Department (HOSPITAL_COMMUNITY)
Admission: EM | Admit: 2023-01-29 | Discharge: 2023-01-29 | Disposition: A | Discharge status: Home / Self Care | Payer: Medicaid Other | Attending: Emergency Medicine | Admitting: Emergency Medicine

## 2023-01-29 ENCOUNTER — Emergency Department (HOSPITAL_COMMUNITY): Payer: Medicaid Other

## 2023-01-29 ENCOUNTER — Encounter (HOSPITAL_COMMUNITY): Payer: Self-pay

## 2023-01-29 DIAGNOSIS — Z7984 Long term (current) use of oral hypoglycemic drugs: Secondary | ICD-10-CM | POA: Diagnosis not present

## 2023-01-29 DIAGNOSIS — M545 Low back pain, unspecified: Secondary | ICD-10-CM | POA: Diagnosis present

## 2023-01-29 DIAGNOSIS — E119 Type 2 diabetes mellitus without complications: Secondary | ICD-10-CM | POA: Insufficient documentation

## 2023-01-29 DIAGNOSIS — Z7901 Long term (current) use of anticoagulants: Secondary | ICD-10-CM | POA: Insufficient documentation

## 2023-01-29 DIAGNOSIS — Z794 Long term (current) use of insulin: Secondary | ICD-10-CM | POA: Insufficient documentation

## 2023-01-29 DIAGNOSIS — I2510 Atherosclerotic heart disease of native coronary artery without angina pectoris: Secondary | ICD-10-CM | POA: Insufficient documentation

## 2023-01-29 DIAGNOSIS — N179 Acute kidney failure, unspecified: Secondary | ICD-10-CM | POA: Diagnosis not present

## 2023-01-29 LAB — URINALYSIS, ROUTINE W REFLEX MICROSCOPIC
Bacteria, UA: NONE SEEN
Bilirubin Urine: NEGATIVE
Glucose, UA: >=500 mg/dL — AB
Ketones, ur: NEGATIVE mg/dL
Leukocytes,Ua: NEGATIVE
Nitrite: NEGATIVE
Protein, ur: 30 mg/dL — AB
Specific Gravity, Urine: 1.014 (ref 1.005–1.030)
pH: 5 (ref 5.0–8.0)

## 2023-01-29 LAB — COMPREHENSIVE METABOLIC PANEL
ALT: 22 U/L (ref 0–44)
AST: 30 U/L (ref 15–41)
Albumin: 4.3 g/dL (ref 3.5–5.0)
Alkaline Phosphatase: 31 U/L — ABNORMAL LOW (ref 38–126)
Anion gap: 13 (ref 5–15)
BUN: 56 mg/dL — ABNORMAL HIGH (ref 6–20)
CO2: 23 mmol/L (ref 22–32)
Calcium: 8.6 mg/dL — ABNORMAL LOW (ref 8.9–10.3)
Chloride: 99 mmol/L (ref 98–111)
Creatinine, Ser: 2.56 mg/dL — ABNORMAL HIGH (ref 0.61–1.24)
GFR, Estimated: 28 mL/min — ABNORMAL LOW (ref >60)
Glucose, Bld: 167 mg/dL — ABNORMAL HIGH (ref 70–99)
Potassium: 4.4 mmol/L (ref 3.5–5.1)
Sodium: 135 mmol/L (ref 135–145)
Total Bilirubin: 0.6 mg/dL (ref 0.3–1.2)
Total Protein: 8.1 g/dL (ref 6.5–8.1)

## 2023-01-29 LAB — LIPASE, BLOOD: Lipase: 52 U/L — ABNORMAL HIGH (ref 11–51)

## 2023-01-29 LAB — CBC
HCT: 37.6 % — ABNORMAL LOW (ref 39.0–52.0)
Hemoglobin: 11.7 g/dL — ABNORMAL LOW (ref 13.0–17.0)
MCH: 29.6 pg (ref 26.0–34.0)
MCHC: 31.1 g/dL (ref 30.0–36.0)
MCV: 95.2 fL (ref 80.0–100.0)
Platelets: 244 10*3/uL (ref 150–400)
RBC: 3.95 MIL/uL — ABNORMAL LOW (ref 4.22–5.81)
RDW: 13.6 % (ref 11.5–15.5)
WBC: 10.5 10*3/uL (ref 4.0–10.5)
nRBC: 0 % (ref 0.0–0.2)

## 2023-01-29 MED ORDER — ACETAMINOPHEN 325 MG PO TABS
650.0000 mg | ORAL_TABLET | Freq: Four times a day (QID) | ORAL | Status: DC | PRN
  Administered 2023-01-29: 650 mg via ORAL
  Filled 2023-01-29: qty 2

## 2023-01-29 MED ORDER — OXYCODONE HCL 5 MG PO TABS
15.0000 mg | ORAL_TABLET | Freq: Four times a day (QID) | ORAL | Status: AC | PRN
  Administered 2023-01-29: 15 mg via ORAL
  Filled 2023-01-29: qty 3

## 2023-01-29 MED ORDER — SODIUM CHLORIDE 0.9 % IV BOLUS
1000.0000 mL | Freq: Once | INTRAVENOUS | Status: AC
  Administered 2023-01-29: 1000 mL via INTRAVENOUS

## 2023-01-29 NOTE — Discharge Instructions (Addendum)
Your kidney tests showed elevated BUN and Cr. Based on your recent history, we believe this was due to dehydration combined with your lasix pills. Please follow up with your nephrologist within the next couple of days to check for resolution of kidney injury. Please seek emergency care if experiencing new/worsening symptoms.

## 2023-01-29 NOTE — ED Triage Notes (Signed)
BIBA from home c/o acute on chronic back pain x3 days. Denies recent injury/trauma. Took oxycodone at home several hours ago without much relief. Also tried to smoke some marijuana to relieve pain without relief. Hx of disc disease and fall in February.  EMS vitals: CBG 204, HR 88, BP 142/78

## 2023-01-29 NOTE — ED Provider Notes (Signed)
[place] EMERGENCY DEPARTMENT AT [facility] Provider Note   CSN: [id number] Arrival date & time: 01/29/23  1857     History  Chief Complaint  Patient presents with   Back Pain    [name] is a 62 y.o. male.  With past medical history of PE on Eliquis, DM2, CAD s/p PCI, and GI bleed who presents to the emergency department complaining of left posterior flank pain. Pain started 2 days ago, and patient was initially concerned that it was due to constipation. After successful home bowel regimen, pain did not resolve. Pain is now described as constant, severe, and stabbing/pressure in nature. He endorses that it feels like a muscle pain. Pain does not radiate. Last BM 01/29/2023. Denies fever, chills, shortness of breath, chest pain, dysuria, hematuria.    Back Pain Associated symptoms: no abdominal pain, no chest pain, no dysuria, no fever and no headaches        Home Medications Prior to Admission medications   Medication Sig Start Date End Date Taking? Authorizing Provider  albuterol (VENTOLIN HFA) 108 (90 Base) MCG/ACT inhaler Inhale 2 puffs into the lungs as needed for wheezing or shortness of breath.    [provider]  atorvastatin (LIPITOR) 40 MG tablet Take 40 mg by mouth daily.    [provider]  B Complex-C (B-COMPLEX WITH VITAMIN C) tablet Take 1 tablet by mouth daily. 11/16/22   Rolly Salter, MD  cyanocobalamin 1000 MCG tablet Take 1,000 mcg by mouth once a week. 03/06/20   [provider]  dicyclomine (BENTYL) 10 MG capsule Take 1 capsule (10 mg total) by mouth 3 (three) times daily as needed for spasms. 11/15/22   Rolly Salter, MD  docusate sodium (COLACE) 100 MG capsule Take 1 capsule (100 mg total) by mouth 2 (two) times daily. Patient taking differently: Take 100 mg by mouth daily as needed for mild constipation. 11/15/22   Rolly Salter, MD  ELIQUIS 5 MG TABS tablet TAKE 1 TABLET BY MOUTH TWICE A DAY 10/03/22    Benjiman Core, MD  fenofibrate (TRICOR) 145 MG tablet TAKE 1 TABLET (145 MG TOTAL) BY MOUTH DAILY. 05/19/22   Ronney Asters, NP  ferrous sulfate 325 (65 FE) MG tablet Take 1 tablet (325 mg total) by mouth daily. 11/16/22 12/16/22  Rolly Salter, MD  furosemide (LASIX) 40 MG tablet Take 40 mg by mouth 2 (two) times daily.    [provider]  gabapentin (NEURONTIN) 800 MG tablet Take 800 mg by mouth 3 (three) times daily.    [provider]  glipiZIDE (GLUCOTROL) 10 MG tablet Take 10 mg by mouth 2 (two) times daily. 05/30/17   [provider]  HUMULIN R U-500 KWIKPEN 500 UNIT/ML kwikpen Inject 95-120 Units into the skin in the morning, at noon, and at bedtime. Per sliding scale 3 times daily with meals 06/04/20   [provider]  hydrocortisone (ANUSOL-HC) 25 MG suppository Place 1 suppository (25 mg total) rectally 2 (two) times daily. Patient taking differently: Place 25 mg rectally 2 (two) times daily as needed for hemorrhoids. 11/15/22   Rolly Salter, MD  Insulin Pen Needle 31G X 5 MM MISC Use 1 needle daily to inject insulin as prescribed 06/18/17   Vassie Loll, MD  isosorbide mononitrate (IMDUR) 30 MG 24 hr tablet TAKE ONE TABLET BY MOUTH DAILY 01/02/23   Hilty, Lisette [name], MD  Menthol, Topical Analgesic, (BIOFREEZE EX) Apply 1 application  topically as needed (knee pain).    [provider]  nitroGLYCERIN (NITROSTAT) 0.4 MG SL tablet Place 1 tablet (0.4 mg total) under the tongue every 5 (five) minutes as needed for chest pain. 02/06/19   Chilton Si, MD  ondansetron (ZOFRAN) 4 MG tablet Take 4 mg by mouth as needed for nausea/vomiting, vomiting or nausea. 04/21/20   [provider]  Oxycodone HCl 20 MG TABS Take 20 mg by mouth every 6 (six) hours as needed (pain).    [provider]  oxymetazoline (AFRIN) 0.05 % nasal spray Place 1 spray into both nostrils 2 (two) times daily as needed for congestion (nose bleeds). 11/16/22    Rolly Salter, MD  pantoprazole (PROTONIX) 40 MG tablet Take 40 mg by mouth daily. 05/26/22   [provider]  polyethylene glycol (MIRALAX / GLYCOLAX) 17 g packet Take 17 g by mouth daily. Patient taking differently: Take 17 g by mouth daily as needed for mild constipation. 11/17/22   Rolly Salter, MD  polyvinyl alcohol (LIQUIFILM TEARS) 1.4 % ophthalmic solution Place 1 drop into both eyes as needed for dry eyes. 11/16/22   Rolly Salter, MD  potassium chloride (MICRO-K) 10 MEQ CR capsule Take 2 capsules (20 mEq total) by mouth 2 (two) times daily. 07/21/22   Lewayne Bunting, MD  sertraline (ZOLOFT) 50 MG tablet Take 50 mg by mouth every morning. 12/17/20   [provider]  simethicone (MYLICON) 80 MG chewable tablet Chew 1 tablet (80 mg total) by mouth 4 (four) times daily. 11/15/22   Rolly Salter, MD  tiZANidine (ZANAFLEX) 4 MG tablet Take 4 mg by mouth at bedtime. 03/31/20   [provider]  traZODone (DESYREL) 50 MG tablet Take 1 tablet (50 mg total) by mouth at bedtime. 12/15/16   Servando Snare, MD  Vitamin D, Ergocalciferol, (DRISDOL) 1.25 MG (50000 UT) CAPS capsule Take 50,000 Units by mouth every Monday. 08/15/19   [provider]      Allergies    Coconut flavor [flavoring agent], Ibuprofen, Aleve [naproxen], Nsaids, Vascepa [icosapent ethyl], and Other    Review of Systems   Review of Systems  Constitutional:  Negative for chills, diaphoresis and fever.  Respiratory:  Negative for cough and shortness of breath.   Cardiovascular:  Negative for chest pain.  Gastrointestinal:  Positive for constipation. Negative for abdominal pain, diarrhea, nausea and vomiting.  Genitourinary:  Negative for dysuria and hematuria.  Musculoskeletal:  Positive for back pain.  Skin:  Negative for color change and rash.  Neurological:  Negative for headaches.    Physical Exam Updated Vital Signs BP 134/77 (BP Location: Right Arm)   Pulse 76   Temp 97.8 F  (36.6 C) (Oral)   Resp 16   Ht  (1.778 m)   Wt (!) 149.7 kg   SpO2 99%   BMI 47.35 kg/m  Physical Exam Vitals and nursing note reviewed.  Constitutional:      General: He is not in acute distress.    Appearance: Normal appearance. He is obese. He is not ill-appearing, toxic-appearing or diaphoretic.  HENT:     Head: Normocephalic and atraumatic.  Eyes:     Extraocular Movements: Extraocular movements intact.     Conjunctiva/sclera: Conjunctivae normal.  Cardiovascular:     Rate and Rhythm: Normal rate and regular rhythm.  Pulmonary:     Effort: Pulmonary effort is normal.     Breath sounds: Normal breath sounds.  Abdominal:  General: Abdomen is flat. Bowel sounds are normal.     Palpations: Abdomen is soft.     Tenderness: There is no abdominal tenderness. There is no left CVA tenderness.  Musculoskeletal:        General: Tenderness present.     Comments: Tenderness to palpation of left posterior flank below rib cage. Patient endorses that pain with palpation is similar to the constant pain that he has been having. Bilateral lower extremeties: +1 pitting edema  Skin:    General: Skin is warm and dry.     Findings: No erythema, lesion or rash.  Neurological:     General: No focal deficit present.     Mental Status: He is alert and oriented to person, place, and time.  Psychiatric:        Mood and Affect: Mood normal.        Behavior: Behavior normal.     ED Results / Procedures / Treatments   Labs (all labs ordered are listed, but only abnormal results are displayed) Labs Reviewed  CBC - Abnormal; Notable for the following components:      Result Value   RBC 3.95 (*)    Hemoglobin 11.7 (*)    HCT 37.6 (*)    All other components within normal limits  URINALYSIS, ROUTINE W REFLEX MICROSCOPIC - Abnormal; Notable for the following components:   Color, Urine STRAW (*)    Glucose, UA >=500 (*)    Hgb urine dipstick MODERATE (*)    Protein, ur 30 (*)    All  other components within normal limits  COMPREHENSIVE METABOLIC PANEL - Abnormal; Notable for the following components:   Glucose, Bld 167 (*)    BUN 56 (*)    Creatinine, Ser 2.56 (*)    Calcium 8.6 (*)    Alkaline Phosphatase 31 (*)    GFR, Estimated 28 (*)    All other components within normal limits  LIPASE, BLOOD - Abnormal; Notable for the following components:   Lipase 52 (*)    All other components within normal limits    EKG None  Radiology CT RENAL STONE STUDY  Result Date: 01/29/2023 CLINICAL DATA:  Abdominal pain, left flank pain EXAM: CT ABDOMEN AND PELVIS WITHOUT CONTRAST TECHNIQUE: Multidetector CT imaging of the abdomen and pelvis was performed following the standard protocol without IV contrast. RADIATION DOSE REDUCTION: This exam was performed according to the departmental dose-optimization program which includes automated exposure control, adjustment of the mA and/or kV according to patient size and/or use of iterative reconstruction technique. COMPARISON:  11/21/2022 FINDINGS: Lower chest: There is mild ectasia bronchi in the lower lung fields. Small linear densities are seen in posterior lower lung fields, more so on the left side suggesting scarring or subsegmental atelectasis. Hepatobiliary: Gallbladder is not distended. There is no dilation of bile ducts. Pancreas: No focal abnormalities are seen. Spleen: Unremarkable. Adrenals/Urinary Tract: Adrenals are unremarkable. There is no hydronephrosis. There are 2 small calcific densities in the lower pole of right kidney each measuring less than 2 mm. There is no perinephric fluid collection. Ureters are not dilated. Urinary bladder is unremarkable. Stomach/Bowel: Stomach is unremarkable. Small bowel loops are not dilated. The appendix is not dilated. There is no significant wall thickening in colon. There is no pericolic stranding. Vascular/Lymphatic: Scattered arterial calcifications are seen. Reproductive: There are small  calcifications in prostate. Other: There is no ascites or pneumoperitoneum. Small paraumbilical hernia containing fat is seen. Small left inguinal hernia containing fat is  seen. Musculoskeletal: Degenerative changes are noted in lumbar spine and right hip. There is spinal stenosis at the L4-L5 level. There is narrowing of neural foramina from L3-S1 levels. IMPRESSION: There is no evidence of intestinal obstruction or pneumoperitoneum. There is no hydronephrosis. Appendix is not dilated. There are 2 tiny right renal stones. Lumbar spondylosis with spinal stenosis and encroachment of neural foramina at multiple levels. Degenerative changes are noted in right hip. Small linear densities in the lower lung fields may suggest minimal scarring or subsegmental atelectasis. Other findings as described in the body of the report. Electronically Signed   By: Ernie Avena M.D.   On: 01/29/2023 20:27    Procedures Procedures    Medications Ordered in ED Medications  acetaminophen (TYLENOL) tablet 650 mg (650 mg Oral Given 01/29/23 2053)  sodium chloride 0.9 % bolus 1,000 mL (1,000 mLs Intravenous New Bag/Given 01/29/23 2127)  oxyCODONE (Oxy IR/ROXICODONE) immediate release tablet 15 mg (15 mg Oral Given 01/29/23 2216)    ED Course/ Medical Decision Making/ A&P                             Medical Decision Making ALBION DEPIETRO 62 y.o. presented today for left posterior flank pain. Working DDx that I considered at this time includes, but not limited to pancreatitis, nephrolithiasis, UTI, pyleonephritis, musculoskeletal.  R/o DDx: These are considered less likely due to history of present illness and physical exam findings. Pancreatitis: lipase within normal limits Nephrolithiasis: CT Renal without concern UTI/Pyelonephritis: UA without signs of infection  Unique Tests and My Interpretation:  CBC with differential: No concern for leukocytosis CMP: BUN/Cr elevated above baseline Lipase: mildly elevated  (52) UA: no concern for infection CT Renal Study: There are 2 tiny right renal stones that do not appear obstructive.  Discussion of Management of Tests: CBC/CMP to assess for infection and kidney damage. UA to assess for kidney infection. CT Renal study to assess for obstructing kidney stone.  Risk: Medium: prescription drug management  Staffed with Dr. Benjiman Core  Medical Decision Making: Patient presented for back pain. On exam patient was sitting comfortably in chair. Physical exam was benign except for tenderness to palpation of left lower posterior flank. Vital signs stable. UA without concern for UTI. CT Renal showing 2 tiny right renal stones without signs of obstruction. BUN/Cr elevated above baseline to 56/2.56. Upon receiving these results, Dr. Rubin Payor and I talked with patient about reasons that his BUN/Cr could be elevated. Patient endorsed taking 40mg  of lasix every morning and evening and having a nephrologist in town. Patient's recent history of constipation and increased fluid intake is concerning for dehydration that could be harming the kidneys with current dose of lasix. I have ordered 1L of fluids for patient to receive in ED. Patient denies any weight change or increase of leg edema. Patient is otherwise stable. I have given him some oxycodone here in the emergency department since he has not been able to take his home dose for >12hrs. Patient states that he would have someone drive him home when he got discharged.  Patient is stable for discharge at this time. Patient agreed to follow up with nephrologist within the next couple of days.       Amount and/or Complexity of Data Reviewed Labs: ordered. Radiology: ordered.          Final Clinical Impression(s) / ED Diagnoses Final diagnoses:  AKI (acute kidney injury)  Rx / DC Orders ED Discharge Orders     None         Margarita RanaMeredith, Makell Drohan F, PA-C 01/29/23 2226    Benjiman CorePickering, Nathan,  MD 01/29/23 2256

## 2023-02-10 DIAGNOSIS — N179 Acute kidney failure, unspecified: Secondary | ICD-10-CM | POA: Diagnosis not present

## 2023-02-10 DIAGNOSIS — Z09 Encounter for follow-up examination after completed treatment for conditions other than malignant neoplasm: Secondary | ICD-10-CM | POA: Diagnosis not present

## 2023-02-10 DIAGNOSIS — W19XXXA Unspecified fall, initial encounter: Secondary | ICD-10-CM | POA: Diagnosis not present

## 2023-02-10 DIAGNOSIS — Z9981 Dependence on supplemental oxygen: Secondary | ICD-10-CM | POA: Diagnosis not present

## 2023-02-17 DIAGNOSIS — E1165 Type 2 diabetes mellitus with hyperglycemia: Secondary | ICD-10-CM | POA: Diagnosis not present

## 2023-02-17 DIAGNOSIS — Z6841 Body Mass Index (BMI) 40.0 and over, adult: Secondary | ICD-10-CM | POA: Diagnosis not present

## 2023-02-17 DIAGNOSIS — I10 Essential (primary) hypertension: Secondary | ICD-10-CM | POA: Diagnosis not present

## 2023-02-17 DIAGNOSIS — Z09 Encounter for follow-up examination after completed treatment for conditions other than malignant neoplasm: Secondary | ICD-10-CM | POA: Diagnosis not present

## 2023-02-22 ENCOUNTER — Encounter: Payer: Self-pay | Admitting: Internal Medicine

## 2023-02-22 ENCOUNTER — Ambulatory Visit: Payer: Medicaid Other | Attending: Internal Medicine | Admitting: Internal Medicine

## 2023-02-22 VITALS — BP 158/72 | HR 75 | Ht 70.0 in | Wt 346.0 lb

## 2023-02-22 DIAGNOSIS — Z794 Long term (current) use of insulin: Secondary | ICD-10-CM

## 2023-02-22 DIAGNOSIS — E781 Pure hyperglyceridemia: Secondary | ICD-10-CM

## 2023-02-22 DIAGNOSIS — E785 Hyperlipidemia, unspecified: Secondary | ICD-10-CM

## 2023-02-22 DIAGNOSIS — E118 Type 2 diabetes mellitus with unspecified complications: Secondary | ICD-10-CM

## 2023-02-22 NOTE — Progress Notes (Signed)
LIPID OFFICE NOTE  Chief Complaint:  Follow-up lipids  Primary Care Physician: Hilton Cork, FNP  Primary Cardiologist:  Olga Millers, MD  HPI:  [name] is a 62 y.o. male who is being seen today for the evaluation of hypertriglyceridemia at the request of Hilton Cork, FNP. [name] is a pleasant 62 year old male patient who I previously took care of back in 2015.  In fact he underwent cardiac catheterization by myself which showed just moderate LAD disease at the time.  He had been managed medically for this.  Other comorbidities include morbid obesity, insulin-dependent diabetes with recently worse blood sugar controlled hemoglobin A1c 9.8.  He has dyslipidemia with persistently elevated triglycerides which has ranged between 202 and 250 on treatment with atorvastatin 80 mg, however recently a repeat lipid profile showed markdly elevated triglycerides near 600.  Over the past year he is suffered from chronic pain and is received steroids, he also has a history of pancreatitis in the past.  Is not clear whether this was related to elevated triglycerides or not.  He ultimately had progression of coronary disease and received a stent to the LAD in August 2017 and transition care over to Dr. Jens Som.  He is currently referred today for evaluation and management of elevated triglycerides.   08/24/2020  [name] is seen today in follow-up.  Over the summer he was hospitalized for DKA/hyperglycemia.  He was hypotensive and he was concerned about possible anaphylaxis as he had just started Vascepa.  He also mentioned that he thought that it increased his blood sugar however there is little evidence of this.  According to the hospitalist physician Dr. Jomarie Longs, the medication was held pending follow-up with me.  Subsequently had been admitted several times with hyperglycemia, infection and other issues including chest pain.  Hemoglobin A1c is 0 poorly controlled at 11.6  despite being on insulin at this point.  Triglycerides again were rechecked at 529.  02/12/2020  [name] returns for follow-up.  He has had some improvement in his triglycerides.  Recent lipids show total cholesterol 113, triglycerides 325, HDL 33 and LDL of 32.  Triglycerides are down from 529 and the LDL is further decreased from 51.  He reports some improvement in his glycemic control.  06/18/2020  [name] is seen today in follow-up.  He was doing well with his triglycerides however recently was admitted had some GI bleeding issues.  Hemoglobin A1c has been trending up all the way to 12.4.  He said he had some issues with his insulin is not working together and his endocrinologist switched him out.  Since then apparently his numbers are better but his labs yesterday showed very poorly controlled triglycerides.  Total cholesterol was 210, triglycerides 1849 and HDL 24.  LDL cannot be calculated due to the significant lipemia.   12/18/2020  [name] is seen today in follow-up.  Unfortunately he has not had reassessment of his triglycerides.  His last lab work however showed significantly elevated triglycerides 1849 in August 2021.  He has been on atorvastatin and fenofibrate, but unfortunately cannot tolerate Vascepa due to headaches chest pain and symptoms similar to his prior stroke.  We discussed several other options for him and I would like him to be evaluated for the core trial.  I do suspect a lot of his triglycerides are related to persistent hyperglycemia however his A1c has come down about 2% but remains above 10% as of December 23.  12/27/2021  [name] returns today for follow-up.  I had referred him to the CORE triglyceride trial, however recent lipids show that his triglycerides were not quite high enough for the trial.  His triglycerides in fact were 499 in February 2023, just slightly below the 500 or higher cutoff for the trial therefore he did not qualify.  He is on max  therapy with statin and fenofibrate.  He has been able to lower his A1c which is a big factor.  02/22/2023  [name] returns today for follow-up.  Fortunately was just hospitalized with acute renal failure.  He had presented with back pain and an unknown as to why he developed this.  He had seen nephrologist afterwards and his creatinine ultimately has returned back to normal.  He was taken off of the diuretic and his ACE inhibitor.  Previously I placed him on Vascepa but he had difficulty tolerating this including several side effects.  He had recent [facility] which showed total cholesterol 124, triglycerides 345, HDL 29 and LDL 43.  He also had recent carotid Dopplers through [facility] which showed mild bilateral carotid disease.  He is due to see Dr. Jens Som back in follow-up this summer.  He says he had a couple episodes of left arm tingling and neck pain which he took nitroglycerin for because he thought it might be angina.  PMHx:  Past Medical History:  Diagnosis Date   Anemia    Arthritis    Back pain    CAD (coronary artery disease)    a. s/p DES to LAD in 05/2016   Cervical radiculopathy    Chronic diastolic CHF (congestive heart failure) (HCC)    Chronic pain    Deep vein thrombosis (HCC) 01/06/2016   Formatting of this note might be different from the original.  Formatting of this note might be different from the original. Last Assessment & Plan: Management per primary care. Last Assessment & Plan: Management per primary care.  Formatting of this note might be different from the original. Last Assessment & Plan: Management per primary care. Last Assessment & Plan: Management per primary care.   Depression    DVT (deep venous thrombosis) (HCC)    Hematemesis    Hepatic steatosis    Hyperlipidemia    Hypertension    IBS (irritable bowel syndrome)    Morbid obesity (HCC)    OSA (obstructive sleep apnea)    Pancreatitis    PE (pulmonary thromboembolism) (HCC)     Peripheral neuropathy    PUD (peptic ulcer disease)    Pulmonary embolism (HCC) 07/15/2021   Renal disorder    Stroke [facility])    a. ?details unclear - not seen on imaging when he was admitted in 05/2017 for TIA symptoms which were felt due to cervical radiculopathy.   Thoracic aortic ectasia (HCC)    a. 4.3cm ectatic ascending thoracic aorta by CT 06/2017.    Type 2 diabetes mellitus (HCC)     Past Surgical History:  Procedure Laterality Date   CARDIAC CATHETERIZATION N/A 05/31/2016   Procedure: Left Heart Cath and Coronary Angiography;  Surgeon: Peter M Swaziland, MD;  Location: [facility] INVASIVE CV LAB;  Service: Cardiovascular;  Laterality: N/A;   CARDIAC CATHETERIZATION N/A 05/31/2016   Procedure: Intravascular Pressure Wire/FFR Study;  Surgeon: Peter M Swaziland, MD;  Location: [facility] INVASIVE CV LAB;  Service: Cardiovascular;  Laterality: N/A;   CARDIAC CATHETERIZATION N/A 05/31/2016   Procedure: Coronary Stent Intervention;  Surgeon: Peter M Swaziland, MD;  Location: [facility] INVASIVE CV LAB;  Service: Cardiovascular;  Laterality: N/A;   COLONOSCOPY N/A 07/17/2021   Procedure: COLONOSCOPY;  Surgeon: Charlott Rakes, MD;  Location: [facility] ENDOSCOPY;  Service: Endoscopy;  Laterality: N/A;   COLONOSCOPY WITH PROPOFOL N/A 04/29/2020   Procedure: COLONOSCOPY WITH PROPOFOL;  Surgeon: Charlott Rakes, MD;  Location: [facility] ENDOSCOPY;  Service: Endoscopy;  Laterality: N/A;   ESOPHAGOGASTRODUODENOSCOPY N/A 04/29/2020   Procedure: ESOPHAGOGASTRODUODENOSCOPY (EGD);  Surgeon: Charlott Rakes, MD;  Location: [facility] ENDOSCOPY;  Service: Endoscopy;  Laterality: N/A;   ESOPHAGOGASTRODUODENOSCOPY (EGD) WITH PROPOFOL N/A 07/17/2021   Procedure: ESOPHAGOGASTRODUODENOSCOPY (EGD) WITH PROPOFOL;  Surgeon: Charlott Rakes, MD;  Location: [facility] ENDOSCOPY;  Service: Endoscopy;  Laterality: N/A;   GIVENS CAPSULE STUDY N/A 11/10/2022   Procedure: GIVENS CAPSULE STUDY;  Surgeon: Kathi Der, MD;  Location: [facility] ENDOSCOPY;  Service: Gastroenterology;   Laterality: N/A;   LEFT HEART CATH AND CORONARY ANGIOGRAPHY N/A 12/08/2017   Procedure: LEFT HEART CATH AND CORONARY ANGIOGRAPHY;  Surgeon: Marykay Lex, MD;  Location: [facility] INVASIVE CV LAB;  Service: Cardiovascular;  Laterality: N/A;   LEFT HEART CATHETERIZATION WITH CORONARY ANGIOGRAM N/A 02/03/2014   Procedure: LEFT HEART CATHETERIZATION WITH CORONARY ANGIOGRAM;  Surgeon: Chrystie Nose, MD;  Location: [facility] CATH LAB;  Service: Cardiovascular;  Laterality: N/A;   left leg stent      POLYPECTOMY  04/29/2020   Procedure: POLYPECTOMY;  Surgeon: Charlott Rakes, MD;  Location: [facility] ENDOSCOPY;  Service: Endoscopy;;   POLYPECTOMY  07/17/2021   Procedure: POLYPECTOMY;  Surgeon: Charlott Rakes, MD;  Location: [facility] ENDOSCOPY;  Service: Endoscopy;;    FAMHx:  Family History  Problem Relation Age of Onset   Cancer Father    Hypertension Mother    Diabetes Mother    Breast cancer Mother    Hypertension Brother    Diabetes Brother    Hypertension Sister    Diabetes Sister     SOCHx:   reports that he has never smoked. He has been exposed to tobacco smoke. He has never used smokeless tobacco. He reports current drug use. Drug: Marijuana. He reports that he does not drink alcohol.  ALLERGIES:  Allergies  Allergen Reactions   Coconut Fatty Acids Hives   Coconut Flavor [Flavoring Agent] Hives   Ibuprofen Other (See Comments)    Made gastric ulcers worse   Aleve [Naproxen] Other (See Comments)    DUE TO KIDNEYS   Nsaids Other (See Comments)    Stomach ulcers    Vascepa [Icosapent Ethyl] Nausea And Vomiting and Other (See Comments)    headaches, chest pain, similar to sx of a stroke, hypotension    Other Hives and Rash    Nut Allergy    ROS: Pertinent items noted in HPI and remainder of comprehensive ROS otherwise negative.  HOME MEDS: Current Outpatient Medications on File Prior to Visit  Medication Sig Dispense Refill   albuterol (VENTOLIN HFA) 108 (90 Base) MCG/ACT inhaler Inhale  2 puffs into the lungs as needed for wheezing or shortness of breath.     atorvastatin (LIPITOR) 40 MG tablet Take 40 mg by mouth daily.     B Complex-C (B-COMPLEX WITH VITAMIN C) tablet Take 1 tablet by mouth daily. 30 tablet 0   cyanocobalamin 1000 MCG tablet Take 1,000 mcg by mouth once a week.     dicyclomine (BENTYL) 10 MG capsule Take 1 capsule (10 mg total) by mouth 3 (three) times daily as needed for spasms. 30 capsule 0   docusate sodium (COLACE) 100 MG  capsule Take 1 capsule (100 mg total) by mouth 2 (two) times daily. (Patient taking differently: Take 100 mg by mouth daily as needed for mild constipation.) 10 capsule 0   ELIQUIS 5 MG TABS tablet TAKE 1 TABLET BY MOUTH TWICE A DAY 60 tablet 2   fenofibrate (TRICOR) 145 MG tablet TAKE 1 TABLET (145 MG TOTAL) BY MOUTH DAILY. 90 tablet 3   furosemide (LASIX) 40 MG tablet Take 40 mg by mouth 2 (two) times daily.     gabapentin (NEURONTIN) 800 MG tablet Take 800 mg by mouth 3 (three) times daily.     glipiZIDE (GLUCOTROL) 10 MG tablet Take 10 mg by mouth 2 (two) times daily.  4   HUMULIN R U-500 KWIKPEN 500 UNIT/ML kwikpen Inject 95-120 Units into the skin in the morning, at noon, and at bedtime. Per sliding scale 3 times daily with meals     hydrocortisone (ANUSOL-HC) 25 MG suppository Place 1 suppository (25 mg total) rectally 2 (two) times daily. (Patient taking differently: Place 25 mg rectally 2 (two) times daily as needed for hemorrhoids.) 12 suppository 0   Insulin Pen Needle 31G X 5 MM MISC Use 1 needle daily to inject insulin as prescribed 100 each 2   isosorbide mononitrate (IMDUR) 30 MG 24 hr tablet TAKE ONE TABLET BY MOUTH DAILY 224 tablet 5   Menthol, Topical Analgesic, (BIOFREEZE EX) Apply 1 application  topically as needed (knee pain).     nitroGLYCERIN (NITROSTAT) 0.4 MG SL tablet Place 1 tablet (0.4 mg total) under the tongue every 5 (five) minutes as needed for chest pain. 100 tablet 1   ondansetron (ZOFRAN) 4 MG tablet Take 4  mg by mouth as needed for nausea/vomiting, vomiting or nausea.     Oxycodone HCl 20 MG TABS Take 20 mg by mouth every 6 (six) hours as needed (pain).     oxymetazoline (AFRIN) 0.05 % nasal spray Place 1 spray into both nostrils 2 (two) times daily as needed for congestion (nose bleeds). 30 mL 0   pantoprazole (PROTONIX) 40 MG tablet Take 40 mg by mouth daily.     polyethylene glycol (MIRALAX / GLYCOLAX) 17 g packet Take 17 g by mouth daily. (Patient taking differently: Take 17 g by mouth daily as needed for mild constipation.) 14 each 0   polyvinyl alcohol (LIQUIFILM TEARS) 1.4 % ophthalmic solution Place 1 drop into both eyes as needed for dry eyes. 15 mL 0   potassium chloride (MICRO-K) 10 MEQ CR capsule Take 2 capsules (20 mEq total) by mouth 2 (two) times daily. 120 capsule 3   sertraline (ZOLOFT) 50 MG tablet Take 50 mg by mouth every morning.     simethicone (MYLICON) 80 MG chewable tablet Chew 1 tablet (80 mg total) by mouth 4 (four) times daily. 30 tablet 0   tiZANidine (ZANAFLEX) 4 MG tablet Take 4 mg by mouth at bedtime.     traZODone (DESYREL) 50 MG tablet Take 1 tablet (50 mg total) by mouth at bedtime. 30 tablet 11   Vitamin D, Ergocalciferol, (DRISDOL) 1.25 MG (50000 UT) CAPS capsule Take 50,000 Units by mouth every Monday.     ferrous sulfate 325 (65 FE) MG tablet Take 1 tablet (325 mg total) by mouth daily. 30 tablet 0   No current facility-administered medications on file prior to visit.    LABS/IMAGING: No results found for this or any previous visit (from the past 48 hour(s)). No results found.  LIPID PANEL:    Component  Value Date/Time   CHOL 97 (L) 12/27/2021 0951   TRIG 248 (H) 12/27/2021 0951   HDL 28 (L) 12/27/2021 0951   CHOLHDL 3.5 12/27/2021 0951   CHOLHDL 4.3 03/08/2019 1008   VLDL 43 (H) 12/08/2017 0212   LDLCALC 31 12/27/2021 0951   LDLCALC  03/08/2019 1008     Comment:     . LDL cholesterol not calculated. Triglyceride levels greater than 400 mg/dL  invalidate calculated LDL results. . Reference range: <100 . Desirable range <100 mg/dL for primary prevention;   <70 mg/dL for patients with CHD or diabetic patients  with > or = 2 CHD risk factors. Marland Kitchen LDL-C is now calculated using the Martin-Hopkins  calculation, which is a validated novel method providing  better accuracy than the Friedewald equation in the  estimation of LDL-C.  Horald Pollen et al. Lenox Ahr. 7829;562(13): 2061-2068  (http://education.[employer].com/faq/FAQ164)    LDLDIRECT 40 12/27/2021 0951    WEIGHTS: Wt Readings from Last 3 Encounters:  02/22/23 (!) 346 lb (156.9 kg)  01/29/23 (!) 330 lb (149.7 kg)  11/10/22 (!) 331 lb 2.1 oz (150.2 kg)    VITALS: BP (!) 158/72 (BP Location: Left Arm, Patient Position: Sitting, Cuff Size: Large)   Pulse 75   Ht 5\' 10"  (1.778 m)   Wt (!) 346 lb (156.9 kg)   SpO2 95%   BMI 49.65 kg/m   EXAM: Deferred  EKG: Deferred  ASSESSMENT: Coronary artery disease status post PCI to the LAD (2017) Insulin-dependent diabetes-uncontrolled, hemoglobin A1c 11.6 Morbid obesity with recent weight loss Hypertension Mixed dyslipidemia with very high triglycerides Intermittent prescription steroid use History of pancreatitis  PLAN: 1.   [name] has had elevated triglycerides although they are improved somewhat.  He has few other options for therapy.  He cannot take a fish oil product and is on statin and fenofibrate.  The fenofibrate dose would need to be adjusted for renal function however his kidney function has improved.  His A1c is also come down quite a bit.  He may have been having some chest pain symptoms and has used nitro recently.  I encouraged him to follow-up with his primary cardiologist which will help arrange for.  No changes to his meds today.  Plan follow-up with me annually or sooner as necessary.  Chrystie Nose, MD, [facility], FACP  [place]  [facility]  Medical Director of the Advanced Lipid Disorders  &  Cardiovascular Risk Reduction Clinic Diplomate of the American Board of Clinical Lipidology Attending Cardiologist  Direct Dial: [phone]  Fax: [phone]  Website:  [email or web address] 02/22/2023, 10:58 AM

## 2023-02-22 NOTE — Patient Instructions (Signed)
Medication Instructions:  NO CHANGES  *If you need a refill on your cardiac medications before your next appointment, please call your pharmacy*   Lab Work: FASTING lab work in 1 year to check cholesterol   If you have labs (blood work) drawn today and your tests are completely normal, you will receive your results only by: MyChart Message (if you have MyChart) OR A paper copy in the mail If you have any lab test that is abnormal or we need to change your treatment, we will call you to review the results.   Follow-Up: At [facility], you and your health needs are our priority.  As part of our continuing mission to provide you with exceptional heart care, we have created designated Provider Care Teams.  These Care Teams include your primary Cardiologist (physician) and Advanced Practice Providers (APPs -  Physician Assistants and Nurse Practitioners) who all work together to provide you with the care you need, when you need it.  We recommend signing up for the patient portal called "MyChart".  Sign up information is provided on this After Visit Summary.  MyChart is used to connect with patients for Virtual Visits (Telemedicine).  Patients are able to view lab/test results, encounter notes, upcoming appointments, etc.  Non-urgent messages can be sent to your provider as well.   To learn more about what you can do with MyChart, go to [email or web address].    Your next appointment:    AS SOON AS ABLE with Dr. Jens Som   12 months with Dr. Rennis Golden -- lipid clinic

## 2023-03-06 NOTE — Progress Notes (Signed)
HPI: FU CAD and diastolic CHF. Pt had DES to LAD 8/17. Cardiac catheterization February 2019 showed patent stents and normal LV function. Left ventricular end-diastolic pressure elevated at 27 mmHg. Carotid Dopplers April 2019 showed 1 to 39% right and near normal left carotid.  Nuclear study January 2023 showed ejection fraction 51% with no ischemia or infarction.  Pulmonary embolus is being followed by hematology.  Patient had his second pulmonary embolus June 2023.  Echocardiogram May 2023 showed normal LV function, mild left atrial enlargement.  Venous Dopplers May 2023 showed no DVT.  Intracranial MRA October 2023 negative.  CTA January 2024 showed normal aorta, pulmonary artery enlargement suggestive of pulmonary hypertension and coronary calcification.  Has been hospitalized with acute kidney injury since last office visit.  This was felt to be iatrogenic from low blood pressure.  His ACE inhibitor was discontinued.  Since last seen he has occasional chest pain that can last 1 to 2 min and sometimes takes nitroglycerin which is longstanding.  It is unchanged compared to previous office visits.  He does have dyspnea on exertion which is also unchanged.  He denies orthopnea or PND but does have mild pedal edema.  He is not had syncope.  Current Outpatient Medications  Medication Sig Dispense Refill   albuterol (VENTOLIN HFA) 108 (90 Base) MCG/ACT inhaler Inhale 2 puffs into the lungs as needed for wheezing or shortness of breath.     atorvastatin (LIPITOR) 40 MG tablet Take 40 mg by mouth daily.     B Complex-C (B-COMPLEX WITH VITAMIN C) tablet Take 1 tablet by mouth daily. 30 tablet 0   cyanocobalamin 1000 MCG tablet Take 1,000 mcg by mouth once a week.     dicyclomine (BENTYL) 10 MG capsule Take 1 capsule (10 mg total) by mouth 3 (three) times daily as needed for spasms. 30 capsule 0   docusate sodium (COLACE) 100 MG capsule Take 1 capsule (100 mg total) by mouth 2 (two) times daily. (Patient  taking differently: Take 100 mg by mouth daily as needed for mild constipation.) 10 capsule 0   ELIQUIS 5 MG TABS tablet TAKE 1 TABLET BY MOUTH TWICE A DAY 60 tablet 2   fenofibrate (TRICOR) 145 MG tablet TAKE 1 TABLET (145 MG TOTAL) BY MOUTH DAILY. 90 tablet 3   ferrous sulfate 325 (65 FE) MG tablet Take 1 tablet (325 mg total) by mouth daily. 30 tablet 0   furosemide (LASIX) 40 MG tablet Take 40 mg by mouth 2 (two) times daily.     gabapentin (NEURONTIN) 800 MG tablet Take 800 mg by mouth 3 (three) times daily.     glipiZIDE (GLUCOTROL) 10 MG tablet Take 10 mg by mouth 2 (two) times daily.  4   HUMULIN R U-500 KWIKPEN 500 UNIT/ML kwikpen Inject 95-120 Units into the skin in the morning, at noon, and at bedtime. Per sliding scale 3 times daily with meals     Insulin Pen Needle 31G X 5 MM MISC Use 1 needle daily to inject insulin as prescribed 100 each 2   isosorbide mononitrate (IMDUR) 30 MG 24 hr tablet TAKE ONE TABLET BY MOUTH DAILY 224 tablet 5   Menthol, Topical Analgesic, (BIOFREEZE EX) Apply 1 application  topically as needed (knee pain).     nitroGLYCERIN (NITROSTAT) 0.4 MG SL tablet Place 1 tablet (0.4 mg total) under the tongue every 5 (five) minutes as needed for chest pain. 100 tablet 1   ondansetron (ZOFRAN) 4 MG tablet  Take 4 mg by mouth as needed for nausea/vomiting, vomiting or nausea.     Oxycodone HCl 20 MG TABS Take 20 mg by mouth every 6 (six) hours as needed (pain).     pantoprazole (PROTONIX) 40 MG tablet Take 40 mg by mouth daily.     polyethylene glycol (MIRALAX / GLYCOLAX) 17 g packet Take 17 g by mouth daily. (Patient taking differently: Take 17 g by mouth daily as needed for mild constipation.) 14 each 0   potassium chloride (MICRO-K) 10 MEQ CR capsule Take 2 capsules (20 mEq total) by mouth 2 (two) times daily. 120 capsule 3   sertraline (ZOLOFT) 50 MG tablet Take 50 mg by mouth every morning.     simethicone (MYLICON) 80 MG chewable tablet Chew 1 tablet (80 mg total) by  mouth 4 (four) times daily. 30 tablet 0   tiZANidine (ZANAFLEX) 4 MG tablet Take 4 mg by mouth at bedtime.     traZODone (DESYREL) 50 MG tablet Take 1 tablet (50 mg total) by mouth at bedtime. 30 tablet 11   Vitamin D, Ergocalciferol, (DRISDOL) 1.25 MG (50000 UT) CAPS capsule Take 50,000 Units by mouth every Monday.     No current facility-administered medications for this visit.     Past Medical History:  Diagnosis Date   Anemia    Arthritis    Back pain    CAD (coronary artery disease)    a. s/p DES to LAD in 05/2016   Cervical radiculopathy    Chronic diastolic CHF (congestive heart failure) (HCC)    Chronic pain    Deep vein thrombosis (HCC) 01/06/2016   Formatting of this note might be different from the original.  Formatting of this note might be different from the original. Last Assessment & Plan: Management per primary care. Last Assessment & Plan: Management per primary care.  Formatting of this note might be different from the original. Last Assessment & Plan: Management per primary care. Last Assessment & Plan: Management per primary care.   Depression    DVT (deep venous thrombosis) (HCC)    Hematemesis    Hepatic steatosis    Hyperlipidemia    Hypertension    IBS (irritable bowel syndrome)    Morbid obesity (HCC)    OSA (obstructive sleep apnea)    Pancreatitis    PE (pulmonary thromboembolism) (HCC)    Peripheral neuropathy    PUD (peptic ulcer disease)    Pulmonary embolism (HCC) 07/15/2021   Renal disorder    Stroke [facility])    a. ?details unclear - not seen on imaging when he was admitted in 05/2017 for TIA symptoms which were felt due to cervical radiculopathy.   Thoracic aortic ectasia (HCC)    a. 4.3cm ectatic ascending thoracic aorta by CT 06/2017.    Type 2 diabetes mellitus (HCC)     Past Surgical History:  Procedure Laterality Date   CARDIAC CATHETERIZATION N/A 05/31/2016   Procedure: Left Heart Cath and Coronary Angiography;  Surgeon: Peter M Swaziland,  MD;  Location: [facility] INVASIVE CV LAB;  Service: Cardiovascular;  Laterality: N/A;   CARDIAC CATHETERIZATION N/A 05/31/2016   Procedure: Intravascular Pressure Wire/FFR Study;  Surgeon: Peter M Swaziland, MD;  Location: [facility] INVASIVE CV LAB;  Service: Cardiovascular;  Laterality: N/A;   CARDIAC CATHETERIZATION N/A 05/31/2016   Procedure: Coronary Stent Intervention;  Surgeon: Peter M Swaziland, MD;  Location: [facility] INVASIVE CV LAB;  Service: Cardiovascular;  Laterality: N/A;   COLONOSCOPY N/A 07/17/2021   Procedure: COLONOSCOPY;  Surgeon: Charlott Rakes, MD;  Location: [facility] ENDOSCOPY;  Service: Endoscopy;  Laterality: N/A;   COLONOSCOPY WITH PROPOFOL N/A 04/29/2020   Procedure: COLONOSCOPY WITH PROPOFOL;  Surgeon: Charlott Rakes, MD;  Location: [facility] ENDOSCOPY;  Service: Endoscopy;  Laterality: N/A;   ESOPHAGOGASTRODUODENOSCOPY N/A 04/29/2020   Procedure: ESOPHAGOGASTRODUODENOSCOPY (EGD);  Surgeon: Charlott Rakes, MD;  Location: [facility] ENDOSCOPY;  Service: Endoscopy;  Laterality: N/A;   ESOPHAGOGASTRODUODENOSCOPY (EGD) WITH PROPOFOL N/A 07/17/2021   Procedure: ESOPHAGOGASTRODUODENOSCOPY (EGD) WITH PROPOFOL;  Surgeon: Charlott Rakes, MD;  Location: [facility] ENDOSCOPY;  Service: Endoscopy;  Laterality: N/A;   GIVENS CAPSULE STUDY N/A 11/10/2022   Procedure: GIVENS CAPSULE STUDY;  Surgeon: Kathi Der, MD;  Location: [facility] ENDOSCOPY;  Service: Gastroenterology;  Laterality: N/A;   LEFT HEART CATH AND CORONARY ANGIOGRAPHY N/A 12/08/2017   Procedure: LEFT HEART CATH AND CORONARY ANGIOGRAPHY;  Surgeon: Marykay Lex, MD;  Location: [facility] INVASIVE CV LAB;  Service: Cardiovascular;  Laterality: N/A;   LEFT HEART CATHETERIZATION WITH CORONARY ANGIOGRAM N/A 02/03/2014   Procedure: LEFT HEART CATHETERIZATION WITH CORONARY ANGIOGRAM;  Surgeon: Chrystie Nose, MD;  Location: [facility] CATH LAB;  Service: Cardiovascular;  Laterality: N/A;   left leg stent      POLYPECTOMY  04/29/2020   Procedure: POLYPECTOMY;  Surgeon: Charlott Rakes, MD;   Location: [facility] ENDOSCOPY;  Service: Endoscopy;;   POLYPECTOMY  07/17/2021   Procedure: POLYPECTOMY;  Surgeon: Charlott Rakes, MD;  Location: [facility] ENDOSCOPY;  Service: Endoscopy;;    Social History   Socioeconomic History   Marital status: Single    Spouse name: Not on file   Number of children: 3   Years of education: 12   Highest education level: Not on file  Occupational History   Occupation: Disabled  Tobacco Use   Smoking status: Never    Passive exposure: Past   Smokeless tobacco: Never  Vaping Use   Vaping Use: Never used  Substance and Sexual Activity   Alcohol use: No   Drug use: Yes    Types: Marijuana   Sexual activity: Not on file  Other Topics Concern   Not on file  Social History Narrative   Independent and ambulatory with cane.   Lives at home alone.   Right-handed.   1-2 cups caffeine per day.   Social Determinants of Health   Financial Resource Strain: Not on file  Food Insecurity: No Food Insecurity (11/21/2022)   Hunger Vital Sign    Worried About Running Out of Food in the Last Year: Never true    Ran Out of Food in the Last Year: Never true  Transportation Needs: No Transportation Needs (11/21/2022)   PRAPARE - Administrator, Civil Service (Medical): No    Lack of Transportation (Non-Medical): No  Physical Activity: Not on file  Stress: Not on file  Social Connections: Not on file  Intimate Partner Violence: Not At Risk (11/21/2022)   Humiliation, Afraid, Rape, and Kick questionnaire    Fear of Current or Ex-Partner: No    Emotionally Abused: No    Physically Abused: No    Sexually Abused: No    Family History  Problem Relation Age of Onset   Cancer Father    Hypertension Mother    Diabetes Mother    Breast cancer Mother    Hypertension Brother    Diabetes Brother    Hypertension Sister    Diabetes Sister     ROS: no fevers or chills, productive cough, hemoptysis, dysphasia, odynophagia, melena, hematochezia, dysuria,  hematuria, rash, seizure activity, orthopnea, PND, pedal edema, claudication. Remaining systems are negative.  Physical Exam: Well-developed well-nourished in no acute distress.  Skin is warm and dry.  HEENT is normal.  Neck is supple.  Chest is clear to auscultation with normal expansion.  Cardiovascular exam is regular rate and rhythm.  Abdominal exam nontender or distended. No masses palpated. Extremities show 1+ edema. neuro grossly intact  ECG-normal sinus rhythm at a rate of 81, no diagnostic ST changes.  Personally reviewed  A/P  1 coronary artery disease-continue statin.  Patient is not on aspirin given need for long-term anticoagulation.  2 history of recurrent pulmonary emboli-continue apixaban.  He will require lifelong anticoagulation as he has had multiple events previously.  3 history of chronic chest pain-his symptoms are unchanged compared to last office visit.  Will follow for now.  4 chronic diastolic congestive heart failure-he appears to be euvolemic.  Note he has had problems with chronic dyspnea and has had occasions of overdiuresis previously.  He states primary care checked his BUN and creatinine recently.  5 hypertension-blood pressure controlled.  Continue present medical regimen.  6 carotid artery disease-mild on most recent Dopplers.  7 dyspnea-felt to be multifactorial including chronic diastolic ingestive heart failure, obesity hypoventilation syndrome, prior pulmonary emboli and possible contribution from sleep apnea.  I have asked him to follow-up with pulmonary as he apparently has a history of sleep apnea but has not been using CPAP.  8 morbid obesity-we discussed the importance of diet, exercise and weight loss.  Olga Millers, MD

## 2023-03-14 ENCOUNTER — Ambulatory Visit: Payer: Medicaid Other | Attending: Cardiology | Admitting: Cardiology

## 2023-03-14 ENCOUNTER — Encounter: Payer: Self-pay | Admitting: Cardiology

## 2023-03-14 VITALS — BP 132/76 | HR 81 | Ht 70.0 in | Wt 336.2 lb

## 2023-03-14 DIAGNOSIS — I2699 Other pulmonary embolism without acute cor pulmonale: Secondary | ICD-10-CM | POA: Diagnosis not present

## 2023-03-14 DIAGNOSIS — I25119 Atherosclerotic heart disease of native coronary artery with unspecified angina pectoris: Secondary | ICD-10-CM | POA: Diagnosis not present

## 2023-03-14 DIAGNOSIS — I10 Essential (primary) hypertension: Secondary | ICD-10-CM

## 2023-03-14 DIAGNOSIS — E785 Hyperlipidemia, unspecified: Secondary | ICD-10-CM

## 2023-03-14 NOTE — Patient Instructions (Signed)
    Follow-Up: At [facility], you and your health needs are our priority.  As part of our continuing mission to provide you with exceptional heart care, we have created designated Provider Care Teams.  These Care Teams include your primary Cardiologist (physician) and Advanced Practice Providers (APPs -  Physician Assistants and Nurse Practitioners) who all work together to provide you with the care you need, when you need it.  We recommend signing up for the patient portal called "MyChart".  Sign up information is provided on this After Visit Summary.  MyChart is used to connect with patients for Virtual Visits (Telemedicine).  Patients are able to view lab/test results, encounter notes, upcoming appointments, etc.  Non-urgent messages can be sent to your provider as well.   To learn more about what you can do with MyChart, go to [email or web address].    Your next appointment:   12 month(s)  Provider:   Brian Crenshaw, MD     

## 2023-03-27 ENCOUNTER — Ambulatory Visit (INDEPENDENT_AMBULATORY_CARE_PROVIDER_SITE_OTHER): Payer: Medicaid Other | Admitting: Podiatry

## 2023-03-27 DIAGNOSIS — B351 Tinea unguium: Secondary | ICD-10-CM

## 2023-03-27 DIAGNOSIS — E1142 Type 2 diabetes mellitus with diabetic polyneuropathy: Secondary | ICD-10-CM

## 2023-03-27 DIAGNOSIS — M79674 Pain in right toe(s): Secondary | ICD-10-CM | POA: Diagnosis not present

## 2023-03-27 DIAGNOSIS — M79675 Pain in left toe(s): Secondary | ICD-10-CM

## 2023-03-27 NOTE — Progress Notes (Signed)
This patient presents  to my office for at risk foot care.  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  This patient presents for at risk foot care today.  General Appearance  Alert, conversant and in no acute stress.  Vascular  Dorsalis pedis   pulses are absent bilaterally. Posterior tibial pulses are absent due to swelling. Capillary return is within normal limits  bilaterally. Temperature is within normal limits  bilaterally.  Venous stasis  B/L.  Neurologic  Senn-Weinstein monofilament wire test within normal limits  bilaterally. Muscle power within normal limits bilaterally.  Nails Thick disfigured discolored nails with subungual debris  from hallux to fifth toes bilaterally. No evidence of bacterial infection or drainage bilaterally.  Orthopedic  No limitations of motion  feet .  No crepitus or effusions noted.  No bony pathology or digital deformities noted.  Skin  dry skin with no porokeratosis noted bilaterally.  No signs of infections or ulcers noted.     Onychomycosis  Pain in right toes  Pain in left toes  Consent was obtained for treatment procedures.   Mechanical debridement of nails 1-5  bilaterally performed with a nail nipper.  Filed with dremel without incident.    Return office visit    3 months                 Told patient to return for periodic foot care and evaluation due to potential at risk complications.   Daine Croker DPM  

## 2023-04-14 ENCOUNTER — Encounter: Payer: Self-pay | Admitting: Orthopaedic Surgery

## 2023-04-14 ENCOUNTER — Ambulatory Visit (INDEPENDENT_AMBULATORY_CARE_PROVIDER_SITE_OTHER): Payer: Medicaid Other

## 2023-04-14 ENCOUNTER — Ambulatory Visit (INDEPENDENT_AMBULATORY_CARE_PROVIDER_SITE_OTHER): Payer: Medicaid Other | Admitting: Orthopaedic Surgery

## 2023-04-14 VITALS — BP 101/62 | HR 75 | Ht 70.0 in | Wt 339.0 lb

## 2023-04-14 DIAGNOSIS — M545 Low back pain, unspecified: Secondary | ICD-10-CM

## 2023-04-14 DIAGNOSIS — M25511 Pain in right shoulder: Secondary | ICD-10-CM

## 2023-04-14 DIAGNOSIS — G8929 Other chronic pain: Secondary | ICD-10-CM | POA: Diagnosis not present

## 2023-04-14 NOTE — Progress Notes (Signed)
Office Visit Note   Patient: [name]           Date of Birth: 04/27/1961           MRN: [id number] Visit Date: 04/14/2023              Requested by: Hilton Cork, FNP [phone] [address] [place],  [place] [address] PCP: Hilton Cork, FNP   Assessment & Plan: Visit Diagnoses:  1. Acute pain of right shoulder   2. Chronic right-sided low back pain, unspecified whether sciatica present     Plan: Will set patient up for therapy.  He is on too much medication to really give him adequate pain control if diagnostic study showed the surgery might be helpful.  Therapy can work on his shoulder and also his low back pain.  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  He has claudication symptoms and I recommended he stop sit and rest.  With his diabetes his heart problems obesity he needs to get up walk as far as he can stops at rest and then repeat.  We discussed walking to a neighbor's house sitting on the porch and then coming back to his house and repeating this to get his exercise and help with weight loss, glucose control as well as his back pain.  I can recheck him in 5 weeks.  We discussed 50 mg daily morphine equivalent as recommended cut off for any elective spine surgery due to problems with pain management postoperatively.  With his other medical problems including diabetes heart failure pulmonary problems he would have elevated risk postoperatively.  Hopefully will get some improvement with the therapy.  Try to work on the walking program as recommended recheck 5 weeks.  Previous CT scans plain radiographs previous imaging studies, shoulder x-rays and previous cervical MRI scan results were reviewed discussed in detail pathophysiology discussed.  Follow-Up Instructions: Return in about 5 weeks (around 05/19/2023).    Orders:  Orders Placed This Encounter  Procedures   XR Lumbar Spine 2-3 Views   XR Shoulder Right   Ambulatory referral to Physical Therapy   No orders of the defined types were placed in this encounter.     Procedures: No procedures performed   Clinical Data: No additional findings.   Subjective: Chief Complaint  Patient presents with   Right Shoulder - Pain   Lower Back - Pain    HPI 62 year old male followed at [facility] on 120 oxycodone a month which is the 20 mg strength for pain management.  He is here with right shoulder pain that at times makes it hard for him to get his arm up over his head.  Shoulder problems been present for a month.  He states he has had couple falls in the last 2 months his balance is off due to previous strokes heart attack.  He has heart failure diabetes, chronic narcotic prescription usage, diabetic peripheral neuropathy.  Morbid obesity BMI 48.  A1c 04/12/2023 was 8.4.  He states he had had a cortisone injection not long ago and he thinks this may have helped elevate his A1c.  Review of Systems no current chest pain patient is ambulating with a cane.   Objective: Vital Signs: BP 101/62   Pulse 75   Ht 5\' 10"  (1.778 m)   Wt Marland Kitchen)  339 lb (153.8 kg)   BMI 48.64 kg/m   Physical Exam Constitutional:      Appearance: He is well-developed.  HENT:     Head: Normocephalic and atraumatic.     Right Ear: External ear normal.     Left Ear: External ear normal.  Eyes:     Pupils: Pupils are equal, round, and reactive to light.  Neck:     Thyroid: No thyromegaly.     Trachea: No tracheal deviation.  Cardiovascular:     Rate and Rhythm: Normal rate.  Pulmonary:     Effort: Pulmonary effort is normal.     Breath sounds: No wheezing.  Abdominal:     General: Bowel sounds are normal.     Palpations: Abdomen is soft.  Musculoskeletal:     Cervical back: Neck supple.  Skin:    General: Skin is warm and dry.     Capillary Refill:  Capillary refill takes less than 2 seconds.  Neurological:     Mental Status: He is alert and oriented to person, place, and time.  Psychiatric:        Behavior: Behavior normal.        Thought Content: Thought content normal.        Judgment: Judgment normal.     Ortho Exam positive impingement sign right shoulder and get his arm up over his head left shoulder goes up easily.  Negative straight leg raising 90 degrees.  No rash or exposed skin.  Specialty Comments:  No specialty comments available.  Imaging: XR Shoulder Right  Result Date: 04/14/2023 Three-view x-rays right shoulder obtained and reviewed.  There is no shoulder subluxation.  Mild acromioclavicular degenerative changes. Impression: Right shoulder negative for acute changes.  XR Lumbar Spine 2-3 Views  Result Date: 04/14/2023 AP lateral lumbar images are obtained and reviewed.  Image quality and impaired by body habitus.  There is narrowing at L5-S1 is previously noted without spondylolisthesis.  Some degenerative facet changes at 5 1. Impression: L5-S1 disc degeneration no acute bone changes    PMFS History: Patient Active Problem List   Diagnosis Date Noted   Paresthesias 11/22/2022   Hypotension 11/21/2022   Acute kidney injury superimposed on chronic kidney disease (HCC) 11/21/2022   GI bleeding 11/10/2022   History of pulmonary embolism 11/10/2022   Right-sided Bell's palsy 10/11/2022   Class 3 obesity (HCC) 08/16/2022   Diplopia 08/15/2022   Carpal tunnel syndrome on both sides 03/14/2022   Diabetic ulcer of lower leg (HCC) 06/26/2021   Lumbar radiculitis 03/30/2021   Morbid obesity (HCC) 01/15/2021   Personal history of colonic polyps 01/15/2021   Pain due to onychomycosis of toenails of both feet 01/15/2021   Epistaxis, recurrent 10/21/2020   Laceration of nose 10/21/2020   OSA (obstructive sleep apnea)    Bilateral carotid artery stenosis 07/28/2020   Bilateral lower extremity edema 07/28/2020    CHF (congestive heart failure) (HCC) 07/28/2020   DDD (degenerative disc disease), lumbar 07/28/2020   Diabetic peripheral neuropathy (HCC) 07/28/2020   Fatty liver 07/28/2020   Irritable bowel syndrome with diarrhea 07/28/2020   Osteoarthritis of right hip 07/28/2020   Mixed diabetic hyperlipidemia associated with type 2 diabetes mellitus (HCC) 04/27/2020   Rotator cuff arthropathy 10/30/2019   Cervical radiculopathy 09/24/2019   Trochanteric bursitis of right hip 09/24/2019   Chronic diastolic CHF (congestive heart failure) (HCC) 08/23/2019   ARF (acute renal failure) (HCC) 08/01/2019   Hyperkalemia 08/01/2019   Uncontrolled type 2 diabetes mellitus  with hyperglycemia (HCC) 08/01/2019   Trochanteric bursitis 02/11/2019   Long-term insulin use (HCC) 01/25/2019   Neck pain 10/09/2018   Left arm weakness 10/09/2018   Decreased sensation of lower extremity 09/12/2018   B12 deficiency 08/10/2017   Persistent headaches 08/09/2017   GERD (gastroesophageal reflux disease) 06/29/2017   Left arm numbness    History of CVA (cerebrovascular accident) 06/17/2017   TIA (transient ischemic attack) 06/17/2017   Degenerative joint disease involving multiple joints 04/09/2017   Foraminal stenosis of lumbar region 01/19/2017   Chronic back pain 12/29/2016   Depression 12/15/2016   Vitamin D deficiency 12/09/2016   Right hip pain 12/07/2016   Hypokalemia 12/07/2016   Type 2 diabetes mellitus with vascular disease (HCC) 05/31/2016   Normocytic normochromic anemia 05/31/2016   Coronary artery disease involving native coronary artery of native heart without angina pectoris 01/06/2016   Chronic pancreatitis (HCC) 03/21/2014   Edema 03/21/2014   Chest pain, non-cardiac 01/29/2014   DM2 (diabetes mellitus, type 2) (HCC) 01/29/2014   Obesity, Class III, BMI 40-49.9 (morbid obesity) (HCC) 01/29/2014   Snoring 01/29/2014   Dyslipidemia 01/29/2014   HTN (hypertension) 01/29/2014   Abnormal nuclear  stress test 01/29/2014   Benign essential hypertension 08/15/2013   Past Medical History:  Diagnosis Date   Anemia    Arthritis    Back pain    CAD (coronary artery disease)    a. s/p DES to LAD in 05/2016   Cervical radiculopathy    Chronic diastolic CHF (congestive heart failure) (HCC)    Chronic pain    Deep vein thrombosis (HCC) 01/06/2016   Formatting of this note might be different from the original.  Formatting of this note might be different from the original. Last Assessment & Plan: Management per primary care. Last Assessment & Plan: Management per primary care.  Formatting of this note might be different from the original. Last Assessment & Plan: Management per primary care. Last Assessment & Plan: Management per primary care.   Depression    DVT (deep venous thrombosis) (HCC)    Hematemesis    Hepatic steatosis    Hyperlipidemia    Hypertension    IBS (irritable bowel syndrome)    Morbid obesity (HCC)    OSA (obstructive sleep apnea)    Pancreatitis    PE (pulmonary thromboembolism) (HCC)    Peripheral neuropathy    PUD (peptic ulcer disease)    Pulmonary embolism (HCC) 07/15/2021   Renal disorder    Stroke [facility])    a. ?details unclear - not seen on imaging when he was admitted in 05/2017 for TIA symptoms which were felt due to cervical radiculopathy.   Thoracic aortic ectasia (HCC)    a. 4.3cm ectatic ascending thoracic aorta by CT 06/2017.    Type 2 diabetes mellitus (HCC)     Family History  Problem Relation Age of Onset   Cancer Father    Hypertension Mother    Diabetes Mother    Breast cancer Mother    Hypertension Brother    Diabetes Brother    Hypertension Sister    Diabetes Sister     Past Surgical History:  Procedure Laterality Date   CARDIAC CATHETERIZATION N/A 05/31/2016   Procedure: Left Heart Cath and Coronary Angiography;  Surgeon: Peter M Swaziland, MD;  Location: [facility] INVASIVE CV LAB;  Service: Cardiovascular;  Laterality: N/A;   CARDIAC  CATHETERIZATION N/A 05/31/2016   Procedure: Intravascular Pressure Wire/FFR Study;  Surgeon: Peter M Swaziland, MD;  Location:  [facility] INVASIVE CV LAB;  Service: Cardiovascular;  Laterality: N/A;   CARDIAC CATHETERIZATION N/A 05/31/2016   Procedure: Coronary Stent Intervention;  Surgeon: Peter M Swaziland, MD;  Location: [facility] INVASIVE CV LAB;  Service: Cardiovascular;  Laterality: N/A;   COLONOSCOPY N/A 07/17/2021   Procedure: COLONOSCOPY;  Surgeon: Charlott Rakes, MD;  Location: [facility] ENDOSCOPY;  Service: Endoscopy;  Laterality: N/A;   COLONOSCOPY WITH PROPOFOL N/A 04/29/2020   Procedure: COLONOSCOPY WITH PROPOFOL;  Surgeon: Charlott Rakes, MD;  Location: [facility] ENDOSCOPY;  Service: Endoscopy;  Laterality: N/A;   ESOPHAGOGASTRODUODENOSCOPY N/A 04/29/2020   Procedure: ESOPHAGOGASTRODUODENOSCOPY (EGD);  Surgeon: Charlott Rakes, MD;  Location: [facility] ENDOSCOPY;  Service: Endoscopy;  Laterality: N/A;   ESOPHAGOGASTRODUODENOSCOPY (EGD) WITH PROPOFOL N/A 07/17/2021   Procedure: ESOPHAGOGASTRODUODENOSCOPY (EGD) WITH PROPOFOL;  Surgeon: Charlott Rakes, MD;  Location: [facility] ENDOSCOPY;  Service: Endoscopy;  Laterality: N/A;   GIVENS CAPSULE STUDY N/A 11/10/2022   Procedure: GIVENS CAPSULE STUDY;  Surgeon: Kathi Der, MD;  Location: [facility] ENDOSCOPY;  Service: Gastroenterology;  Laterality: N/A;   LEFT HEART CATH AND CORONARY ANGIOGRAPHY N/A 12/08/2017   Procedure: LEFT HEART CATH AND CORONARY ANGIOGRAPHY;  Surgeon: Marykay Lex, MD;  Location: [facility] INVASIVE CV LAB;  Service: Cardiovascular;  Laterality: N/A;   LEFT HEART CATHETERIZATION WITH CORONARY ANGIOGRAM N/A 02/03/2014   Procedure: LEFT HEART CATHETERIZATION WITH CORONARY ANGIOGRAM;  Surgeon: Chrystie Nose, MD;  Location: [facility] CATH LAB;  Service: Cardiovascular;  Laterality: N/A;   left leg stent      POLYPECTOMY  04/29/2020   Procedure: POLYPECTOMY;  Surgeon: Charlott Rakes, MD;  Location: [facility] ENDOSCOPY;  Service: Endoscopy;;   POLYPECTOMY  07/17/2021   Procedure:  POLYPECTOMY;  Surgeon: Charlott Rakes, MD;  Location: [facility] ENDOSCOPY;  Service: Endoscopy;;   Social History   Occupational History   Occupation: Disabled  Tobacco Use   Smoking status: Never    Passive exposure: Past   Smokeless tobacco: Never  Vaping Use   Vaping Use: Never used  Substance and Sexual Activity   Alcohol use: No   Drug use: Yes    Types: Marijuana   Sexual activity: Not on file

## 2023-05-03 ENCOUNTER — Encounter (HOSPITAL_COMMUNITY): Payer: Self-pay

## 2023-05-03 ENCOUNTER — Ambulatory Visit (HOSPITAL_COMMUNITY)
Admission: EM | Admit: 2023-05-03 | Discharge: 2023-05-03 | Disposition: A | Discharge status: Home / Self Care | Payer: Medicaid Other | Attending: Nurse Practitioner | Admitting: Nurse Practitioner

## 2023-05-03 ENCOUNTER — Ambulatory Visit: Payer: Medicaid Other | Admitting: Physical Therapy

## 2023-05-03 DIAGNOSIS — Z1152 Encounter for screening for COVID-19: Secondary | ICD-10-CM | POA: Diagnosis present

## 2023-05-03 DIAGNOSIS — J069 Acute upper respiratory infection, unspecified: Secondary | ICD-10-CM | POA: Diagnosis present

## 2023-05-03 MED ORDER — BENZONATATE 100 MG PO CAPS: 100.0000 mg | ORAL_CAPSULE | Freq: Three times a day (TID) | ORAL | 0 refills | Status: DC | PRN

## 2023-05-03 NOTE — Discharge Instructions (Signed)
You have a viral upper respiratory infection.  Symptoms should improve over the next week to 10 days.  If you develop chest pain or shortness of breath, go to the emergency room.  We have tested you today for COVID-19.  You will see the results in Mychart and we will call you with positive results.  Please stay home and isolate until you are aware of the results.    Some things that can make you feel better are: - Increased rest - Increasing fluid with water/sugar free electrolytes - Acetaminophen and ibuprofen as needed for fever/pain - Salt water gargling, chloraseptic spray and throat lozenges for sore throat - OTC guaifenesin (Mucinex) 600 mg twice daily for congestion - Saline sinus flushes or a neti pot - Humidifying the air - Coricidin HBP products -Tessalon Perles every 8 hours as needed for dry cough

## 2023-05-03 NOTE — ED Provider Notes (Signed)
[facility]    CSN: [id number] Arrival date & time: 05/03/23  1320      History   Chief Complaint Chief Complaint  Patient presents with   Cough   Nasal Congestion    HPI [name] is a 62 y.o. male.   Patient presents today for 3-day history of bodyaches and chills, congested cough, runny and stuffy nose, sore throat, diarrhea, decreased appetite, and fatigue.  No known fevers at home, headache, ear pain, abdominal pain, nausea or vomiting.  Patient reports he has shortness of breath and chest pain at baseline and they have not worsened.  He has been taking NyQuil, severe cold and flu medicine without relief.  Has also been using rescue inhaler which does seem to help a little bit with the shortness of breath.  Last use was approximately 20 minutes prior to arrival.    Past Medical History:  Diagnosis Date   Anemia    Arthritis    Back pain    CAD (coronary artery disease)    a. s/p DES to LAD in 05/2016   Cervical radiculopathy    Chronic diastolic CHF (congestive heart failure) (HCC)    Chronic pain    Deep vein thrombosis (HCC) 01/06/2016   Formatting of this note might be different from the original.  Formatting of this note might be different from the original. Last Assessment & Plan: Management per primary care. Last Assessment & Plan: Management per primary care.  Formatting of this note might be different from the original. Last Assessment & Plan: Management per primary care. Last Assessment & Plan: Management per primary care.   Depression    DVT (deep venous thrombosis) (HCC)    Hematemesis    Hepatic steatosis    Hyperlipidemia    Hypertension    IBS (irritable bowel syndrome)    Morbid obesity (HCC)    OSA (obstructive sleep apnea)    Pancreatitis    PE (pulmonary thromboembolism) (HCC)    Peripheral neuropathy    PUD (peptic ulcer disease)    Pulmonary embolism (HCC) 07/15/2021   Renal disorder    Stroke [facility])    a. ?details unclear  - not seen on imaging when he was admitted in 05/2017 for TIA symptoms which were felt due to cervical radiculopathy.   Thoracic aortic ectasia (HCC)    a. 4.3cm ectatic ascending thoracic aorta by CT 06/2017.    Type 2 diabetes mellitus [facility])     Patient Active Problem List   Diagnosis Date Noted   Paresthesias 11/22/2022   Hypotension 11/21/2022   Acute kidney injury superimposed on chronic kidney disease (HCC) 11/21/2022   GI bleeding 11/10/2022   History of pulmonary embolism 11/10/2022   Right-sided Bell's palsy 10/11/2022   Class 3 obesity (HCC) 08/16/2022   Diplopia 08/15/2022   Carpal tunnel syndrome on both sides 03/14/2022   Diabetic ulcer of lower leg (HCC) 06/26/2021   Lumbar radiculitis 03/30/2021   Morbid obesity (HCC) 01/15/2021   Personal history of colonic polyps 01/15/2021   Pain due to onychomycosis of toenails of both feet 01/15/2021   Epistaxis, recurrent 10/21/2020   Laceration of nose 10/21/2020   OSA (obstructive sleep apnea)    Bilateral carotid artery stenosis 07/28/2020   Bilateral lower extremity edema 07/28/2020   CHF (congestive heart failure) (HCC) 07/28/2020   DDD (degenerative disc disease), lumbar 07/28/2020   Diabetic peripheral neuropathy (HCC) 07/28/2020   Fatty liver 07/28/2020   Irritable bowel syndrome with diarrhea  07/28/2020   Osteoarthritis of right hip 07/28/2020   Mixed diabetic hyperlipidemia associated with type 2 diabetes mellitus (HCC) 04/27/2020   Rotator cuff arthropathy 10/30/2019   Cervical radiculopathy 09/24/2019   Trochanteric bursitis of right hip 09/24/2019   Chronic diastolic CHF (congestive heart failure) (HCC) 08/23/2019   ARF (acute renal failure) (HCC) 08/01/2019   Hyperkalemia 08/01/2019   Uncontrolled type 2 diabetes mellitus with hyperglycemia (HCC) 08/01/2019   Trochanteric bursitis 02/11/2019   Long-term insulin use (HCC) 01/25/2019   Neck pain 10/09/2018   Left arm weakness 10/09/2018   Decreased sensation  of lower extremity 09/12/2018   B12 deficiency 08/10/2017   Persistent headaches 08/09/2017   GERD (gastroesophageal reflux disease) 06/29/2017   Left arm numbness    History of CVA (cerebrovascular accident) 06/17/2017   TIA (transient ischemic attack) 06/17/2017   Degenerative joint disease involving multiple joints 04/09/2017   Foraminal stenosis of lumbar region 01/19/2017   Chronic back pain 12/29/2016   Depression 12/15/2016   Vitamin D deficiency 12/09/2016   Right hip pain 12/07/2016   Hypokalemia 12/07/2016   Type 2 diabetes mellitus with vascular disease (HCC) 05/31/2016   Normocytic normochromic anemia 05/31/2016   Coronary artery disease involving native coronary artery of native heart without angina pectoris 01/06/2016   Chronic pancreatitis (HCC) 03/21/2014   Edema 03/21/2014   Chest pain, non-cardiac 01/29/2014   DM2 (diabetes mellitus, type 2) (HCC) 01/29/2014   Obesity, Class III, BMI 40-49.9 (morbid obesity) (HCC) 01/29/2014   Snoring 01/29/2014   Dyslipidemia 01/29/2014   HTN (hypertension) 01/29/2014   Abnormal nuclear stress test 01/29/2014   Benign essential hypertension 08/15/2013    Past Surgical History:  Procedure Laterality Date   CARDIAC CATHETERIZATION N/A 05/31/2016   Procedure: Left Heart Cath and Coronary Angiography;  Surgeon: Peter M Swaziland, MD;  Location: [facility] INVASIVE CV LAB;  Service: Cardiovascular;  Laterality: N/A;   CARDIAC CATHETERIZATION N/A 05/31/2016   Procedure: Intravascular Pressure Wire/FFR Study;  Surgeon: Peter M Swaziland, MD;  Location: [facility] INVASIVE CV LAB;  Service: Cardiovascular;  Laterality: N/A;   CARDIAC CATHETERIZATION N/A 05/31/2016   Procedure: Coronary Stent Intervention;  Surgeon: Peter M Swaziland, MD;  Location: [facility] INVASIVE CV LAB;  Service: Cardiovascular;  Laterality: N/A;   COLONOSCOPY N/A 07/17/2021   Procedure: COLONOSCOPY;  Surgeon: Charlott Rakes, MD;  Location: [facility] ENDOSCOPY;  Service: Endoscopy;  Laterality: N/A;    COLONOSCOPY WITH PROPOFOL N/A 04/29/2020   Procedure: COLONOSCOPY WITH PROPOFOL;  Surgeon: Charlott Rakes, MD;  Location: [facility] ENDOSCOPY;  Service: Endoscopy;  Laterality: N/A;   ESOPHAGOGASTRODUODENOSCOPY N/A 04/29/2020   Procedure: ESOPHAGOGASTRODUODENOSCOPY (EGD);  Surgeon: Charlott Rakes, MD;  Location: [facility] ENDOSCOPY;  Service: Endoscopy;  Laterality: N/A;   ESOPHAGOGASTRODUODENOSCOPY (EGD) WITH PROPOFOL N/A 07/17/2021   Procedure: ESOPHAGOGASTRODUODENOSCOPY (EGD) WITH PROPOFOL;  Surgeon: Charlott Rakes, MD;  Location: [facility] ENDOSCOPY;  Service: Endoscopy;  Laterality: N/A;   GIVENS CAPSULE STUDY N/A 11/10/2022   Procedure: GIVENS CAPSULE STUDY;  Surgeon: Kathi Der, MD;  Location: [facility] ENDOSCOPY;  Service: Gastroenterology;  Laterality: N/A;   LEFT HEART CATH AND CORONARY ANGIOGRAPHY N/A 12/08/2017   Procedure: LEFT HEART CATH AND CORONARY ANGIOGRAPHY;  Surgeon: Marykay Lex, MD;  Location: [facility] INVASIVE CV LAB;  Service: Cardiovascular;  Laterality: N/A;   LEFT HEART CATHETERIZATION WITH CORONARY ANGIOGRAM N/A 02/03/2014   Procedure: LEFT HEART CATHETERIZATION WITH CORONARY ANGIOGRAM;  Surgeon: Chrystie Nose, MD;  Location[facility] CATH LAB;  Service: Cardiovascular;  Laterality: N/A;   left leg stent  POLYPECTOMY  04/29/2020   Procedure: POLYPECTOMY;  Surgeon: Charlott Rakes, MD;  Location: [facility] ENDOSCOPY;  Service: Endoscopy;;   POLYPECTOMY  07/17/2021   Procedure: POLYPECTOMY;  Surgeon: Charlott Rakes, MD;  Location: [facility] ENDOSCOPY;  Service: Endoscopy;;       Home Medications    Prior to Admission medications   Medication Sig Start Date End Date Taking? Authorizing Provider  albuterol (VENTOLIN HFA) 108 (90 Base) MCG/ACT inhaler Inhale 2 puffs into the lungs as needed for wheezing or shortness of breath.   Yes [provider]  atorvastatin (LIPITOR) 40 MG tablet Take 40 mg by mouth daily.   Yes [provider]  B Complex-C (B-COMPLEX WITH VITAMIN C) tablet  Take 1 tablet by mouth daily. 11/16/22  Yes Rolly Salter, MD  benzonatate (TESSALON) 100 MG capsule Take 1 capsule (100 mg total) by mouth 3 (three) times daily as needed for cough. Do not take with alcohol or while driving or operating heavy machinery.  May cause drowsiness. 05/03/23  Yes Cathlean Marseilles A, NP  cyanocobalamin 1000 MCG tablet Take 1,000 mcg by mouth once a week. 03/06/20  Yes [provider]  dicyclomine (BENTYL) 10 MG capsule Take 1 capsule (10 mg total) by mouth 3 (three) times daily as needed for spasms. 11/15/22  Yes Rolly Salter, MD  ELIQUIS 5 MG TABS tablet TAKE 1 TABLET BY MOUTH TWICE A DAY 10/03/22  Yes Benjiman Core, MD  fenofibrate (TRICOR) 145 MG tablet TAKE 1 TABLET (145 MG TOTAL) BY MOUTH DAILY. 05/19/22  Yes Cleaver, Thomasene Ripple, NP  furosemide (LASIX) 40 MG tablet Take 40 mg by mouth 2 (two) times daily.   Yes [provider]  gabapentin (NEURONTIN) 800 MG tablet Take 800 mg by mouth 3 (three) times daily.   Yes [provider]  glipiZIDE (GLUCOTROL) 10 MG tablet Take 10 mg by mouth 2 (two) times daily. 05/30/17  Yes [provider]  HUMULIN R U-500 KWIKPEN 500 UNIT/ML kwikpen Inject 95-120 Units into the skin in the morning, at noon, and at bedtime. Per sliding scale 3 times daily with meals 06/04/20  Yes [provider]  Insulin Pen Needle 31G X 5 MM MISC Use 1 needle daily to inject insulin as prescribed 06/18/17  Yes Vassie Loll, MD  Menthol, Topical Analgesic, (BIOFREEZE EX) Apply 1 application  topically as needed (knee pain).   Yes [provider]  ondansetron (ZOFRAN) 4 MG tablet Take 4 mg by mouth as needed for nausea/vomiting, vomiting or nausea. 04/21/20  Yes [provider]  Oxycodone HCl 20 MG TABS Take 20 mg by mouth every 6 (six) hours as needed (pain).   Yes [provider]  pantoprazole (PROTONIX) 40 MG tablet Take 40 mg by mouth daily. 05/26/22  Yes [provider]   sertraline (ZOLOFT) 50 MG tablet Take 50 mg by mouth every morning. 12/17/20  Yes [provider]  tiZANidine (ZANAFLEX) 4 MG tablet Take 4 mg by mouth at bedtime. 03/31/20  Yes [provider]  traZODone (DESYREL) 50 MG tablet Take 1 tablet (50 mg total) by mouth at bedtime. 12/15/16  Yes Burns, Tinnie Gens, MD  Vitamin D, Ergocalciferol, (DRISDOL) 1.25 MG (50000 UT) CAPS capsule Take 50,000 Units by mouth every Monday. 08/15/19  Yes [provider]  docusate sodium (COLACE) 100 MG capsule Take 1 capsule (100 mg total) by mouth 2 (two) times daily. Patient taking differently: Take 100 mg by mouth daily as needed for mild constipation.  11/15/22   Rolly Salter, MD  ferrous sulfate 325 (65 FE) MG tablet Take 1 tablet (325 mg total) by mouth daily. 11/16/22 12/16/22  Rolly Salter, MD  isosorbide mononitrate (IMDUR) 30 MG 24 hr tablet TAKE ONE TABLET BY MOUTH DAILY 01/02/23   Hilty, Lisette [name], MD  nitroGLYCERIN (NITROSTAT) 0.4 MG SL tablet Place 1 tablet (0.4 mg total) under the tongue every 5 (five) minutes as needed for chest pain. 02/06/19   Chilton Si, MD  polyethylene glycol (MIRALAX / GLYCOLAX) 17 g packet Take 17 g by mouth daily. Patient taking differently: Take 17 g by mouth daily as needed for mild constipation. 11/17/22   Rolly Salter, MD  potassium chloride (MICRO-K) 10 MEQ CR capsule Take 2 capsules (20 mEq total) by mouth 2 (two) times daily. 07/21/22   Lewayne Bunting, MD  simethicone (MYLICON) 80 MG chewable tablet Chew 1 tablet (80 mg total) by mouth 4 (four) times daily. 11/15/22   Rolly Salter, MD    Family History Family History  Problem Relation Age of Onset   Cancer Father    Hypertension Mother    Diabetes Mother    Breast cancer Mother    Hypertension Brother    Diabetes Brother    Hypertension Sister    Diabetes Sister     Social History Social History   Tobacco Use   Smoking status: Never    Passive exposure: Past   Smokeless  tobacco: Never  Vaping Use   Vaping Use: Never used  Substance Use Topics   Alcohol use: No   Drug use: Yes    Types: Marijuana     Allergies   Coconut fatty acids, Coconut flavor [flavoring agent], Ibuprofen, Aleve [naproxen], Nsaids, Vascepa [icosapent ethyl], and Other   Review of Systems Review of Systems Per HPI  Physical Exam Triage Vital Signs ED Triage Vitals [05/03/23 1329]  Enc Vitals Group     BP (!) 175/68     Pulse Rate 86     Resp 16     Temp 98.5 F (36.9 C)     Temp Source Oral     SpO2 93 %     Weight      Height      Head Circumference      Peak Flow      Pain Score      Pain Loc      Pain Edu?      Excl. in GC?    No data found.  Updated Vital Signs BP (!) 175/68 (BP Location: Left Arm)   Pulse 86   Temp 98.5 F (36.9 C) (Oral)   Resp 16   SpO2 93%   Visual Acuity Right Eye Distance:   Left Eye Distance:   Bilateral Distance:    Right Eye Near:   Left Eye Near:    Bilateral Near:     Physical Exam Vitals and nursing note reviewed.  Constitutional:      General: He is not in acute distress.    Appearance: Normal appearance. He is obese. He is ill-appearing. He is not toxic-appearing.  HENT:     Head: Normocephalic and atraumatic.     Right Ear: Tympanic membrane, ear canal and external ear normal.     Left Ear: Tympanic membrane, ear canal and external ear normal.     Nose: Rhinorrhea present. No congestion.     Mouth/Throat:     Mouth: Mucous membranes are moist.  Pharynx: Oropharynx is clear. No oropharyngeal exudate or posterior oropharyngeal erythema.  Eyes:     General: No scleral icterus.    Extraocular Movements: Extraocular movements intact.  Cardiovascular:     Rate and Rhythm: Normal rate and regular rhythm.  Pulmonary:     Effort: Pulmonary effort is normal. No respiratory distress.     Breath sounds: Normal breath sounds. No wheezing, rhonchi or rales.  Abdominal:     General: Abdomen is flat. Bowel sounds  are normal. There is no distension.     Palpations: Abdomen is soft.  Musculoskeletal:     Cervical back: Normal range of motion and neck supple.  Lymphadenopathy:     Cervical: No cervical adenopathy.  Skin:    General: Skin is warm and dry.     Coloration: Skin is not jaundiced or pale.     Findings: No erythema or rash.  Neurological:     Mental Status: He is alert and oriented to person, place, and time.  Psychiatric:        Behavior: Behavior is cooperative.      UC Treatments / Results  Labs (all labs ordered are listed, but only abnormal results are displayed) Labs Reviewed  SARS CORONAVIRUS 2 (TAT 6-24 HRS)    EKG   Radiology No results found.  Procedures Procedures (including critical care time)  Medications Ordered in UC Medications - No data to display  Initial Impression / Assessment and Plan / UC Course  I have reviewed the triage vital signs and the nursing notes.  Pertinent labs & imaging results that were available during my care of the patient were reviewed by me and considered in my medical decision making (see chart for details).   Patient is well-appearing, afebrile, not tachycardic, not tachypneic, oxygenating well on room air.  Patient is hypertensive in triage.  1. Viral URI with cough 2. Encounter for screening for COVID-19 Suspect viral etiology Vitals and exam today reassuring COVID-19 test obtained Patient is a candidate for molnupiravir if he tests positive Supportive care discussed-recommended to take continuing NyQuil/DayQuil and starting Coricidin NyQuil and DayQuil are likely elevating blood pressure Start cough [facility] ER and return precautions discussed with patient  The patient was given the opportunity to ask questions.  All questions answered to their satisfaction.  The patient is in agreement to this plan.    Final Clinical Impressions(s) / UC Diagnoses   Final diagnoses:  Viral URI with cough  Encounter for screening  for COVID-19     Discharge Instructions      You have a viral upper respiratory infection.  Symptoms should improve over the next week to 10 days.  If you develop chest pain or shortness of breath, go to the emergency room.  We have tested you today for COVID-19.  You will see the results in Mychart and we will call you with positive results.  Please stay home and isolate until you are aware of the results.    Some things that can make you feel better are: - Increased rest - Increasing fluid with water/sugar free electrolytes - Acetaminophen and ibuprofen as needed for fever/pain - Salt water gargling, chloraseptic spray and throat lozenges for sore throat - OTC guaifenesin (Mucinex) 600 mg twice daily for congestion - Saline sinus flushes or a neti pot - Humidifying the air - Coricidin HBP products -Tessalon [place] every 8 hours as needed for dry cough      ED Prescriptions     Medication  Sig Dispense Auth. Provider   benzonatate (TESSALON) 100 MG capsule Take 1 capsule (100 mg total) by mouth 3 (three) times daily as needed for cough. Do not take with alcohol or while driving or operating heavy machinery.  May cause drowsiness. 21 capsule Valentino Nose, NP      PDMP not reviewed this encounter.   Valentino Nose, NP 05/03/23 [phone]

## 2023-05-03 NOTE — ED Triage Notes (Signed)
Pt presents for cough and nasal congestion x 3 days.Pt is using both inhaler to help with cough. Denies any fever.

## 2023-05-04 ENCOUNTER — Telehealth (HOSPITAL_COMMUNITY): Payer: Self-pay | Admitting: Emergency Medicine

## 2023-05-04 LAB — SARS CORONAVIRUS 2 (TAT 6-24 HRS): SARS Coronavirus 2: POSITIVE — AB

## 2023-05-04 MED ORDER — MOLNUPIRAVIR EUA 200MG CAPSULE: 4.0000 | ORAL_CAPSULE | Freq: Two times a day (BID) | ORAL | 0 refills | Status: AC

## 2023-05-05 ENCOUNTER — Telehealth (HOSPITAL_COMMUNITY): Payer: Self-pay | Admitting: Emergency Medicine

## 2023-05-05 NOTE — Telephone Encounter (Signed)
Patient left voicemail stating Molnupiravir is not covered.  Returned call and reviewed return precautions and course of viral illness, he verbalized understanding.

## 2023-05-10 ENCOUNTER — Ambulatory Visit: Payer: Medicaid Other

## 2023-05-14 ENCOUNTER — Inpatient Hospital Stay (HOSPITAL_COMMUNITY)
Admission: EM | Admit: 2023-05-14 | Discharge: 2023-05-16 | DRG: 683 | Disposition: A | Discharge status: Home / Self Care | Payer: Medicaid Other | Attending: Internal Medicine | Admitting: Internal Medicine

## 2023-05-14 ENCOUNTER — Other Ambulatory Visit: Payer: Self-pay

## 2023-05-14 ENCOUNTER — Emergency Department (HOSPITAL_COMMUNITY): Payer: Medicaid Other

## 2023-05-14 ENCOUNTER — Encounter (HOSPITAL_COMMUNITY): Payer: Self-pay

## 2023-05-14 ENCOUNTER — Inpatient Hospital Stay (HOSPITAL_COMMUNITY): Payer: Medicaid Other

## 2023-05-14 DIAGNOSIS — Z6841 Body Mass Index (BMI) 40.0 and over, adult: Secondary | ICD-10-CM

## 2023-05-14 DIAGNOSIS — N179 Acute kidney failure, unspecified: Principal | ICD-10-CM | POA: Diagnosis present

## 2023-05-14 DIAGNOSIS — Z886 Allergy status to analgesic agent status: Secondary | ICD-10-CM

## 2023-05-14 DIAGNOSIS — N1832 Chronic kidney disease, stage 3b: Secondary | ICD-10-CM | POA: Diagnosis not present

## 2023-05-14 DIAGNOSIS — I10 Essential (primary) hypertension: Secondary | ICD-10-CM | POA: Diagnosis not present

## 2023-05-14 DIAGNOSIS — K589 Irritable bowel syndrome without diarrhea: Secondary | ICD-10-CM | POA: Diagnosis present

## 2023-05-14 DIAGNOSIS — I130 Hypertensive heart and chronic kidney disease with heart failure and stage 1 through stage 4 chronic kidney disease, or unspecified chronic kidney disease: Secondary | ICD-10-CM | POA: Diagnosis present

## 2023-05-14 DIAGNOSIS — Z79899 Other long term (current) drug therapy: Secondary | ICD-10-CM

## 2023-05-14 DIAGNOSIS — I951 Orthostatic hypotension: Secondary | ICD-10-CM

## 2023-05-14 DIAGNOSIS — Z7901 Long term (current) use of anticoagulants: Secondary | ICD-10-CM | POA: Diagnosis not present

## 2023-05-14 DIAGNOSIS — E785 Hyperlipidemia, unspecified: Secondary | ICD-10-CM | POA: Diagnosis present

## 2023-05-14 DIAGNOSIS — Z9981 Dependence on supplemental oxygen: Secondary | ICD-10-CM

## 2023-05-14 DIAGNOSIS — Z888 Allergy status to other drugs, medicaments and biological substances status: Secondary | ICD-10-CM

## 2023-05-14 DIAGNOSIS — E662 Morbid (severe) obesity with alveolar hypoventilation: Secondary | ICD-10-CM | POA: Diagnosis present

## 2023-05-14 DIAGNOSIS — Z955 Presence of coronary angioplasty implant and graft: Secondary | ICD-10-CM | POA: Diagnosis not present

## 2023-05-14 DIAGNOSIS — Z8616 Personal history of COVID-19: Secondary | ICD-10-CM

## 2023-05-14 DIAGNOSIS — E1159 Type 2 diabetes mellitus with other circulatory complications: Secondary | ICD-10-CM

## 2023-05-14 DIAGNOSIS — I5032 Chronic diastolic (congestive) heart failure: Secondary | ICD-10-CM | POA: Diagnosis present

## 2023-05-14 DIAGNOSIS — N184 Chronic kidney disease, stage 4 (severe): Secondary | ICD-10-CM | POA: Diagnosis present

## 2023-05-14 DIAGNOSIS — Z86711 Personal history of pulmonary embolism: Secondary | ICD-10-CM | POA: Diagnosis present

## 2023-05-14 DIAGNOSIS — Z8249 Family history of ischemic heart disease and other diseases of the circulatory system: Secondary | ICD-10-CM | POA: Diagnosis not present

## 2023-05-14 DIAGNOSIS — Z8711 Personal history of peptic ulcer disease: Secondary | ICD-10-CM

## 2023-05-14 DIAGNOSIS — E1122 Type 2 diabetes mellitus with diabetic chronic kidney disease: Secondary | ICD-10-CM | POA: Diagnosis present

## 2023-05-14 DIAGNOSIS — Z7984 Long term (current) use of oral hypoglycemic drugs: Secondary | ICD-10-CM

## 2023-05-14 DIAGNOSIS — E66813 Obesity, class 3: Secondary | ICD-10-CM | POA: Diagnosis present

## 2023-05-14 DIAGNOSIS — E119 Type 2 diabetes mellitus without complications: Secondary | ICD-10-CM

## 2023-05-14 DIAGNOSIS — G8929 Other chronic pain: Secondary | ICD-10-CM | POA: Diagnosis present

## 2023-05-14 DIAGNOSIS — F32A Depression, unspecified: Secondary | ICD-10-CM | POA: Diagnosis present

## 2023-05-14 DIAGNOSIS — Z794 Long term (current) use of insulin: Secondary | ICD-10-CM

## 2023-05-14 DIAGNOSIS — G4733 Obstructive sleep apnea (adult) (pediatric): Secondary | ICD-10-CM | POA: Diagnosis present

## 2023-05-14 DIAGNOSIS — I509 Heart failure, unspecified: Secondary | ICD-10-CM

## 2023-05-14 DIAGNOSIS — N189 Chronic kidney disease, unspecified: Secondary | ICD-10-CM | POA: Diagnosis not present

## 2023-05-14 DIAGNOSIS — Z86718 Personal history of other venous thrombosis and embolism: Secondary | ICD-10-CM | POA: Diagnosis not present

## 2023-05-14 DIAGNOSIS — E1165 Type 2 diabetes mellitus with hyperglycemia: Secondary | ICD-10-CM | POA: Diagnosis present

## 2023-05-14 DIAGNOSIS — K219 Gastro-esophageal reflux disease without esophagitis: Secondary | ICD-10-CM | POA: Diagnosis present

## 2023-05-14 DIAGNOSIS — Z91018 Allergy to other foods: Secondary | ICD-10-CM

## 2023-05-14 DIAGNOSIS — E1142 Type 2 diabetes mellitus with diabetic polyneuropathy: Secondary | ICD-10-CM | POA: Diagnosis present

## 2023-05-14 DIAGNOSIS — R42 Dizziness and giddiness: Secondary | ICD-10-CM

## 2023-05-14 DIAGNOSIS — Z8673 Personal history of transient ischemic attack (TIA), and cerebral infarction without residual deficits: Secondary | ICD-10-CM

## 2023-05-14 DIAGNOSIS — R079 Chest pain, unspecified: Secondary | ICD-10-CM | POA: Diagnosis not present

## 2023-05-14 DIAGNOSIS — I2510 Atherosclerotic heart disease of native coronary artery without angina pectoris: Secondary | ICD-10-CM | POA: Diagnosis present

## 2023-05-14 LAB — BASIC METABOLIC PANEL WITH GFR
Anion gap: 15 (ref 5–15)
BUN: 63 mg/dL — ABNORMAL HIGH (ref 6–20)
CO2: 23 mmol/L (ref 22–32)
Calcium: 9.1 mg/dL (ref 8.9–10.3)
Chloride: 99 mmol/L (ref 98–111)
Creatinine, Ser: 4.01 mg/dL — ABNORMAL HIGH (ref 0.61–1.24)
GFR, Estimated: 16 mL/min — ABNORMAL LOW
Glucose, Bld: 236 mg/dL — ABNORMAL HIGH (ref 70–99)
Potassium: 4.1 mmol/L (ref 3.5–5.1)
Sodium: 137 mmol/L (ref 135–145)

## 2023-05-14 LAB — CBC
HCT: 38.4 % — ABNORMAL LOW (ref 39.0–52.0)
Hemoglobin: 12.1 g/dL — ABNORMAL LOW (ref 13.0–17.0)
MCH: 30.6 pg (ref 26.0–34.0)
MCHC: 31.5 g/dL (ref 30.0–36.0)
MCV: 97 fL (ref 80.0–100.0)
Platelets: 231 10*3/uL (ref 150–400)
RBC: 3.96 MIL/uL — ABNORMAL LOW (ref 4.22–5.81)
RDW: 14.9 % (ref 11.5–15.5)
WBC: 10.2 10*3/uL (ref 4.0–10.5)
nRBC: 0 % (ref 0.0–0.2)

## 2023-05-14 LAB — D-DIMER, QUANTITATIVE: D-Dimer, Quant: <0.27 ug/mL-FEU (ref 0.00–0.50)

## 2023-05-14 LAB — TROPONIN I (HIGH SENSITIVITY)
Troponin I (High Sensitivity): 6 ng/L
Troponin I (High Sensitivity): 6 ng/L (ref <18)
Troponin I (High Sensitivity): 6 ng/L (ref <18)
Troponin I (High Sensitivity): 6 ng/L (ref <18)

## 2023-05-14 MED ORDER — NITROGLYCERIN 2 % TD OINT
0.5000 [in_us] | TOPICAL_OINTMENT | Freq: Once | TRANSDERMAL | Status: DC
  Filled 2023-05-14: qty 30

## 2023-05-14 MED ORDER — SODIUM CHLORIDE 0.9 % IV SOLN: INTRAVENOUS | Status: DC

## 2023-05-14 MED ORDER — SODIUM CHLORIDE 0.9 % IV BOLUS
500.0000 mL | Freq: Once | INTRAVENOUS | Status: AC
  Administered 2023-05-14: 500 mL via INTRAVENOUS

## 2023-05-14 MED ORDER — NITROGLYCERIN 2 % TD OINT
0.5000 [in_us] | TOPICAL_OINTMENT | Freq: Four times a day (QID) | TRANSDERMAL | Status: DC | PRN
  Filled 2023-05-14: qty 30

## 2023-05-14 MED ORDER — ACETAMINOPHEN 325 MG PO TABS: 650.0000 mg | ORAL_TABLET | ORAL | Status: DC | PRN

## 2023-05-14 MED ORDER — MORPHINE SULFATE (PF) 2 MG/ML IV SOLN
2.0000 mg | INTRAVENOUS | Status: DC | PRN
  Administered 2023-05-14 – 2023-05-15 (×4): 2 mg via INTRAVENOUS
  Filled 2023-05-14 (×4): qty 1

## 2023-05-14 MED ORDER — ONDANSETRON HCL 4 MG/2ML IJ SOLN
4.0000 mg | Freq: Four times a day (QID) | INTRAMUSCULAR | Status: DC | PRN
  Administered 2023-05-15: 4 mg via INTRAVENOUS
  Filled 2023-05-14: qty 2

## 2023-05-14 NOTE — ED Provider Notes (Signed)
[place] EMERGENCY DEPARTMENT AT [facility] Provider Note   CSN: [id number] Arrival date & time: 05/14/23  1117     History  Chief Complaint  Patient presents with   Chest Pain    [name] is a 62 y.o. male PMHx CHF HTN, HLD, Stroke, DM who presents to ED concerned for central chest pain that worsens with deep breathing. Patient endorses intermittent chronic chest pain over the past few months that are not associated with exertion vs rest. Patient concerned for this chest pain today because he also became weak/lightheaded. Patient withheld lasix in fear that he was hypotensive.  Denies fever, dyspnea, abdominal pain, nausea, vomiting, syncope.    Chest Pain      Home Medications Prior to Admission medications   Medication Sig Start Date End Date Taking? Authorizing Provider  albuterol (VENTOLIN HFA) 108 (90 Base) MCG/ACT inhaler Inhale 2 puffs into the lungs as needed for wheezing or shortness of breath.    [provider]  atorvastatin (LIPITOR) 40 MG tablet Take 40 mg by mouth daily.    [provider]  B Complex-C (B-COMPLEX WITH VITAMIN C) tablet Take 1 tablet by mouth daily. 11/16/22   Rolly Salter, MD  benzonatate (TESSALON) 100 MG capsule Take 1 capsule (100 mg total) by mouth 3 (three) times daily as needed for cough. Do not take with alcohol or while driving or operating heavy machinery.  May cause drowsiness. 05/03/23   Valentino Nose, NP  cyanocobalamin 1000 MCG tablet Take 1,000 mcg by mouth once a week. 03/06/20   [provider]  dicyclomine (BENTYL) 10 MG capsule Take 1 capsule (10 mg total) by mouth 3 (three) times daily as needed for spasms. 11/15/22   Rolly Salter, MD  docusate sodium (COLACE) 100 MG capsule Take 1 capsule (100 mg total) by mouth 2 (two) times daily. Patient taking differently: Take 100 mg by mouth daily as needed for mild constipation. 11/15/22   Rolly Salter, MD  ELIQUIS 5 MG TABS tablet  TAKE 1 TABLET BY MOUTH TWICE A DAY 10/03/22   Benjiman Core, MD  fenofibrate (TRICOR) 145 MG tablet TAKE 1 TABLET (145 MG TOTAL) BY MOUTH DAILY. 05/19/22   Ronney Asters, NP  ferrous sulfate 325 (65 FE) MG tablet Take 1 tablet (325 mg total) by mouth daily. 11/16/22 12/16/22  Rolly Salter, MD  furosemide (LASIX) 40 MG tablet Take 40 mg by mouth 2 (two) times daily.    [provider]  gabapentin (NEURONTIN) 800 MG tablet Take 800 mg by mouth 3 (three) times daily.    [provider]  glipiZIDE (GLUCOTROL) 10 MG tablet Take 10 mg by mouth 2 (two) times daily. 05/30/17   [provider]  HUMULIN R U-500 KWIKPEN 500 UNIT/ML kwikpen Inject 95-120 Units into the skin in the morning, at noon, and at bedtime. Per sliding scale 3 times daily with meals 06/04/20   [provider]  Insulin Pen Needle 31G X 5 MM MISC Use 1 needle daily to inject insulin as prescribed 06/18/17   Vassie Loll, MD  isosorbide mononitrate (IMDUR) 30 MG 24 hr tablet TAKE ONE TABLET BY MOUTH DAILY 01/02/23   Hilty, Lisette [name], MD  Menthol, Topical Analgesic, (BIOFREEZE EX) Apply 1 application  topically as needed (knee pain).    [provider]  nitroGLYCERIN (NITROSTAT) 0.4 MG SL tablet Place 1 tablet (0.4 mg total) under the tongue every 5 (five) minutes as  needed for chest pain. 02/06/19   Chilton Si, MD  ondansetron (ZOFRAN) 4 MG tablet Take 4 mg by mouth as needed for nausea/vomiting, vomiting or nausea. 04/21/20   [provider]  Oxycodone HCl 20 MG TABS Take 20 mg by mouth every 6 (six) hours as needed (pain).    [provider]  pantoprazole (PROTONIX) 40 MG tablet Take 40 mg by mouth daily. 05/26/22   [provider]  polyethylene glycol (MIRALAX / GLYCOLAX) 17 g packet Take 17 g by mouth daily. Patient taking differently: Take 17 g by mouth daily as needed for mild constipation. 11/17/22   Rolly Salter, MD  potassium chloride (MICRO-K) 10 MEQ CR  capsule Take 2 capsules (20 mEq total) by mouth 2 (two) times daily. 07/21/22   Lewayne Bunting, MD  sertraline (ZOLOFT) 50 MG tablet Take 50 mg by mouth every morning. 12/17/20   [provider]  simethicone (MYLICON) 80 MG chewable tablet Chew 1 tablet (80 mg total) by mouth 4 (four) times daily. 11/15/22   Rolly Salter, MD  tiZANidine (ZANAFLEX) 4 MG tablet Take 4 mg by mouth at bedtime. 03/31/20   [provider]  traZODone (DESYREL) 50 MG tablet Take 1 tablet (50 mg total) by mouth at bedtime. 12/15/16   Servando Snare, MD  Vitamin D, Ergocalciferol, (DRISDOL) 1.25 MG (50000 UT) CAPS capsule Take 50,000 Units by mouth every Monday. 08/15/19   [provider]      Allergies    Coconut fatty acids, Coconut flavor [flavoring agent], Ibuprofen, Aleve [naproxen], Nsaids, Vascepa [icosapent ethyl], and Other    Review of Systems   Review of Systems  Cardiovascular:  Positive for chest pain.    Physical Exam Updated Vital Signs BP 110/68   Pulse 72   Temp 97.7 F (36.5 C) (Oral)   Resp 12   SpO2 97%  Physical Exam Vitals and nursing note reviewed.  Constitutional:      General: He is not in acute distress.    Appearance: He is not ill-appearing, toxic-appearing or diaphoretic.  HENT:     Head: Normocephalic and atraumatic.     Mouth/Throat:     Mouth: Mucous membranes are moist.     Pharynx: No oropharyngeal exudate or posterior oropharyngeal erythema.  Eyes:     General: No scleral icterus.       Right eye: No discharge.        Left eye: No discharge.     Conjunctiva/sclera: Conjunctivae normal.  Cardiovascular:     Rate and Rhythm: Normal rate and regular rhythm.     Pulses: Normal pulses.          Radial pulses are 2+ on the right side and 2+ on the left side.       Dorsalis pedis pulses are 2+ on the right side and 2+ on the left side.     Heart sounds: Normal heart sounds. No murmur heard. Pulmonary:     Effort: Pulmonary effort is normal. No  respiratory distress.     Breath sounds: No wheezing, rhonchi or rales.  Chest:     Chest wall: No tenderness.  Abdominal:     General: Bowel sounds are normal.     Palpations: Abdomen is soft.     Tenderness: There is no abdominal tenderness.  Musculoskeletal:     Right lower leg: No edema.     Left lower leg: No edema.  Skin:    General: Skin is  warm and dry.     Findings: No rash.  Neurological:     General: No focal deficit present.     Mental Status: He is alert and oriented to person, place, and time. Mental status is at baseline.     Motor: No weakness.     Comments: GCS 15. Speech is goal oriented. No deficits appreciated to CN III-XII; symmetric eyebrow raise, no facial drooping, tongue midline. Patient has equal grip strength bilaterally with 5/5 strength against resistance in all major muscle groups bilaterally. Sensation to light touch intact. Patient moves extremities without ataxia. Patient ambulatory with steady gait.   Psychiatric:        Mood and Affect: Mood normal.        Behavior: Behavior normal.     ED Results / Procedures / Treatments   Labs (all labs ordered are listed, but only abnormal results are displayed) Labs Reviewed  BASIC METABOLIC PANEL - Abnormal; Notable for the following components:      Result Value   Glucose, Bld 236 (*)    BUN 63 (*)    Creatinine, Ser 4.01 (*)    GFR, Estimated 16 (*)    All other components within normal limits  CBC - Abnormal; Notable for the following components:   RBC 3.96 (*)    Hemoglobin 12.1 (*)    HCT 38.4 (*)    All other components within normal limits  TROPONIN I (HIGH SENSITIVITY)  TROPONIN I (HIGH SENSITIVITY)    EKG EKG Interpretation Date/Time:  Sunday May 14 2023 11:27:09 EDT Ventricular Rate:  81 PR Interval:  179 QRS Duration:  97 QT Interval:  367 QTC Calculation: 426 R Axis:   59  Text Interpretation: Sinus rhythm No significant change since last tracing Confirmed by Elayne Snare ([id number]) on 05/14/2023 12:32:12 PM  Radiology DG Chest 2 View  Result Date: 05/14/2023 CLINICAL DATA:  Chest pain, worsening with deep inspiration. EXAM: CHEST - 2 VIEW COMPARISON:  Radiographs 11/21/2022 and 11/12/2022.  CT 11/21/2022. FINDINGS: The heart size and mediastinal contours are normal. The lungs are clear. There is no pleural effusion or pneumothorax. No acute osseous findings are identified. Telemetry leads overlie the chest. There are mild degenerative changes throughout the thoracic spine. IMPRESSION: No active cardiopulmonary process. Electronically Signed   By: Carey Bullocks M.D.   On: 05/14/2023 12:07    Procedures Procedures    Medications Ordered in ED Medications - No data to display  ED Course/ Medical Decision Making/ A&P                             Medical Decision Making Amount and/or Complexity of Data Reviewed Labs: ordered. Radiology: ordered.   This patient presents to the ED for concern of weakness, this involves an extensive number of treatment options, and is a complaint that carries with it a high risk of complications and morbidity.  The differential diagnosis includes Ischemic stroke, intracerebral hemorrhage, subarachnoid hemorrhage, Guillain-Barr syndrome, hypoglycemia, electrolyte abnormality, sepsis, ACS, carbon monoxide poisoning, anemia, dehydration, AKI.   Co morbidities that complicate the patient evaluation  CHF, HTN, HLD, Stroke, DM   Lab Tests:  I Ordered, and personally interpreted labs.  The pertinent results include:  - Troponin: within normal limits and flat - BMP: concerning for EKI - CBC: mild anemia; no leukocytosis   Imaging Studies ordered:  I ordered imaging studies including  -Chest x-ray: To assess for process contributing  patient's symptoms I independently visualized and interpreted imaging I agree with the radiologist interpretation   Cardiac Monitoring: / EKG:  The patient was maintained on a  cardiac monitor.  I personally viewed and interpreted the cardiac monitored which showed an underlying rhythm of: Sinus rhythm without acute ST changes or arrhythmias   Consultations Obtained:  I requested consultation with Hopsitalist,  and discussed lab and imaging findings as well as pertinent plan - they agree to admit patient   Problem List / ED Course / Critical interventions / Medication management  Admitting patient for AKI Patient presents emergency room concern for chronic intermittent chest pain that is complicated by weakness and lightheadedness that started today -  so he withheld his lasix in fear that he was hypotensive.  Patient denying any infectious symptoms such as fever, cough, dysuria, abdominal pain. Physical and neuro exam unremarkable. Patient afebrile with stable vitals.  Troponins within normal limits and flat.  Chest x-ray without acute cardiopulmonary disease.  EKG without ST changes.  CBC with mild anemia and no leukocytosis.  BMP with elevated BUN/creatinine (63/4.01). I have reviewed the patients home medicines and have made adjustments as needed. Dr. Maisie Fus admitting provider.    DDX: these are considered less likely due to history of present illness and physical exam findings -Ischemic stroke/ICH/SAH: no neurodeficits -Hypoglycemia/electrolyte abnormality: CMP/BMP without concern -sepsis: no fever; vital signs stable -ACS: EKG sinus rhythm and no acute ST changes -Guillain-Barr syndrome: no tingling/paresthesias in extremities, no neurodeficits -carbon monoxide poisoning: no nausea/vomiting; AMS; vision changes; dyspnea   Social Determinants of Health:  none           Final Clinical Impression(s) / ED Diagnoses Final diagnoses:  AKI (acute kidney injury) [facility])    Rx / DC Orders ED Discharge Orders     None         Dorthy Cooler, [place] 05/14/23 1546    Rexford Maus, DO 05/14/23 [phone]

## 2023-05-14 NOTE — ED Notes (Signed)
ED TO INPATIENT HANDOFF REPORT  ED Nurse Name and Phone #: maggie  S Name/Age/Gender [name] 62 y.o. male Room/Bed: WA06/WA06  Code Status   Code Status: Prior  Home/SNF/Other Home Patient oriented to: self, place, time, and situation Is this baseline? Yes   Triage Complete: Triage complete  Chief Complaint AKI (acute kidney injury) (HCC) [N17.9]  Triage Note Pt arrived via EMS, from home. C/o sternal chest pain, worsening with deep breathing. Took a NTG at home, pain went from 6 to 3/4. Pt recently dx with COVID x10 days ago, states he had cough and a lot of mucus but otherwise felt fine until today. States he wears 4L O2 at home at baseline, but doesn't like to bring it outside of his home.    Allergies Allergies  Allergen Reactions   Coconut Fatty Acids Hives   Coconut Flavor [Flavoring Agent] Hives   Ibuprofen Other (See Comments)    Made gastric ulcers worse   Aleve [Naproxen] Other (See Comments)    DUE TO KIDNEYS   Nsaids Other (See Comments)    Stomach ulcers    Vascepa [Icosapent Ethyl] Nausea And Vomiting and Other (See Comments)    headaches, chest pain, similar to sx of a stroke, hypotension    Other Hives and Rash    Nut Allergy    Level of Care/Admitting Diagnosis ED Disposition     ED Disposition  Admit   Condition  --   Comment  Hospital Area[facility] [[id number]]  Level of Care: Telemetry [5]  Admit to tele based on following criteria: Other see comments  Comments: presyncope /aki  May admit patient to [facility] or [facility] if equivalent level of care is available:: No  Covid Evaluation: Asymptomatic - no recent exposure (last 10 days) testing not required  Diagnosis: AKI (acute kidney injury) [facility]) [[id number]]  Admitting Physician: Lurline Del [[id number]]  Attending Physician: Lurline Del [[id number]]  Certification:: I certify this patient will need inpatient services for at least 2 midnights   Estimated Length of Stay: 3          B Medical/Surgery History Past Medical History:  Diagnosis Date   Anemia    Arthritis    Back pain    CAD (coronary artery disease)    a. s/p DES to LAD in 05/2016   Cervical radiculopathy    Chronic diastolic CHF (congestive heart failure) (HCC)    Chronic pain    Deep vein thrombosis (HCC) 01/06/2016   Formatting of this note might be different from the original.  Formatting of this note might be different from the original. Last Assessment & Plan: Management per primary care. Last Assessment & Plan: Management per primary care.  Formatting of this note might be different from the original. Last Assessment & Plan: Management per primary care. Last Assessment & Plan: Management per primary care.   Depression    DVT (deep venous thrombosis) (HCC)    Hematemesis    Hepatic steatosis    Hyperlipidemia    Hypertension    IBS (irritable bowel syndrome)    Morbid obesity (HCC)    OSA (obstructive sleep apnea)    Pancreatitis    PE (pulmonary thromboembolism) (HCC)    Peripheral neuropathy    PUD (peptic ulcer disease)    Pulmonary embolism (HCC) 07/15/2021   Renal disorder    Stroke [facility])    a. ?details unclear - not seen on imaging when he was admitted  in 05/2017 for TIA symptoms which were felt due to cervical radiculopathy.   Thoracic aortic ectasia (HCC)    a. 4.3cm ectatic ascending thoracic aorta by CT 06/2017.    Type 2 diabetes mellitus (HCC)    Past Surgical History:  Procedure Laterality Date   CARDIAC CATHETERIZATION N/A 05/31/2016   Procedure: Left Heart Cath and Coronary Angiography;  Surgeon: Peter M Swaziland, MD;  Location: [facility] INVASIVE CV LAB;  Service: Cardiovascular;  Laterality: N/A;   CARDIAC CATHETERIZATION N/A 05/31/2016   Procedure: Intravascular Pressure Wire/FFR Study;  Surgeon: Peter M Swaziland, MD;  Location: [facility] INVASIVE CV LAB;  Service: Cardiovascular;  Laterality: N/A;   CARDIAC CATHETERIZATION N/A 05/31/2016    Procedure: Coronary Stent Intervention;  Surgeon: Peter M Swaziland, MD;  Location: [facility] INVASIVE CV LAB;  Service: Cardiovascular;  Laterality: N/A;   COLONOSCOPY N/A 07/17/2021   Procedure: COLONOSCOPY;  Surgeon: Charlott Rakes, MD;  Location: [facility] ENDOSCOPY;  Service: Endoscopy;  Laterality: N/A;   COLONOSCOPY WITH PROPOFOL N/A 04/29/2020   Procedure: COLONOSCOPY WITH PROPOFOL;  Surgeon: Charlott Rakes, MD;  Location: [facility] ENDOSCOPY;  Service: Endoscopy;  Laterality: N/A;   ESOPHAGOGASTRODUODENOSCOPY N/A 04/29/2020   Procedure: ESOPHAGOGASTRODUODENOSCOPY (EGD);  Surgeon: Charlott Rakes, MD;  Location: [facility] ENDOSCOPY;  Service: Endoscopy;  Laterality: N/A;   ESOPHAGOGASTRODUODENOSCOPY (EGD) WITH PROPOFOL N/A 07/17/2021   Procedure: ESOPHAGOGASTRODUODENOSCOPY (EGD) WITH PROPOFOL;  Surgeon: Charlott Rakes, MD;  Location: [facility] ENDOSCOPY;  Service: Endoscopy;  Laterality: N/A;   GIVENS CAPSULE STUDY N/A 11/10/2022   Procedure: GIVENS CAPSULE STUDY;  Surgeon: Kathi Der, MD;  Location: [facility] ENDOSCOPY;  Service: Gastroenterology;  Laterality: N/A;   LEFT HEART CATH AND CORONARY ANGIOGRAPHY N/A 12/08/2017   Procedure: LEFT HEART CATH AND CORONARY ANGIOGRAPHY;  Surgeon: Marykay Lex, MD;  Location: [facility] INVASIVE CV LAB;  Service: Cardiovascular;  Laterality: N/A;   LEFT HEART CATHETERIZATION WITH CORONARY ANGIOGRAM N/A 02/03/2014   Procedure: LEFT HEART CATHETERIZATION WITH CORONARY ANGIOGRAM;  Surgeon: Chrystie Nose, MD;  Location[facility] CATH LAB;  Service: Cardiovascular;  Laterality: N/A;   left leg stent      POLYPECTOMY  04/29/2020   Procedure: POLYPECTOMY;  Surgeon: Charlott Rakes, MD;  Location: [facility] ENDOSCOPY;  Service: Endoscopy;;   POLYPECTOMY  07/17/2021   Procedure: POLYPECTOMY;  Surgeon: Charlott Rakes, MD;  Location: [facility] ENDOSCOPY;  Service: Endoscopy;;     A IV Location/Drains/Wounds Patient Lines/Drains/Airways Status     Active Line/Drains/Airways     None             Intake/Output Last 24 hours No intake or output data in the 24 hours ending 05/14/23 1554  Labs/Imaging Results for orders placed or performed during the hospital encounter of 05/14/23 (from the past 48 hour(s))  Basic metabolic panel     Status: Abnormal   Collection Time: 05/14/23 12:06 PM  Result Value Ref Range   Sodium 137 135 - 145 mmol/L   Potassium 4.1 3.5 - 5.1 mmol/L   Chloride 99 98 - 111 mmol/L   CO2 23 22 - 32 mmol/L   Glucose, Bld 236 (H) 70 - 99 mg/dL    Comment: Glucose reference range applies only to samples taken after fasting for at least 8 hours.   BUN 63 (H) 6 - 20 mg/dL   Creatinine, Ser 8.41 (H) 0.61 - 1.24 mg/dL   Calcium 9.1 8.9 - 32.4 mg/dL   GFR, Estimated 16 (L) >60 mL/min    Comment: (NOTE) Calculated using the CKD-EPI Creatinine Equation (2021)  Anion gap 15 5 - 15    Comment: Performed at [facility], [address] [place], [place] [address]  CBC     Status: Abnormal   Collection Time: 05/14/23 12:06 PM  Result Value Ref Range   WBC 10.2 4.0 - 10.5 K/uL   RBC 3.96 (L) 4.22 - 5.81 MIL/uL   Hemoglobin 12.1 (L) 13.0 - 17.0 g/dL   HCT 19.1 (L) 47.8 - 29.5 %   MCV 97.0 80.0 - 100.0 fL   MCH 30.6 26.0 - 34.0 pg   MCHC 31.5 30.0 - 36.0 g/dL   RDW 62.1 30.8 - 65.7 %   Platelets 231 150 - 400 K/uL   nRBC 0.0 0.0 - 0.2 %    Comment: Performed at [facility], [address], [place], [place] [address]  Troponin I (High Sensitivity)     Status: None   Collection Time: 05/14/23 12:06 PM  Result Value Ref Range   Troponin I (High Sensitivity) 6 <18 ng/L    Comment: (NOTE) Elevated high sensitivity troponin I (hsTnI) values and significant  changes across serial measurements may suggest ACS but many other  chronic and acute conditions are known to elevate hsTnI results.  Refer to the "Links" section for chest pain algorithms and additional  guidance. Performed at [facility], [address], [place], [place] [address]   Troponin I (High Sensitivity)     Status: None   Collection Time: 05/14/23  2:05 PM  Result Value Ref Range   Troponin I (High Sensitivity) 6 <18 ng/L    Comment: (NOTE) Elevated high sensitivity troponin I (hsTnI) values and significant  changes across serial measurements may suggest ACS but many other  chronic and acute conditions are known to elevate hsTnI results.  Refer to the "Links" section for chest pain algorithms and additional  guidance. Performed at [facility], [address] [place], [place] [address]    DG Chest 2 View  Result Date: 05/14/2023 CLINICAL DATA:  Chest pain, worsening with deep inspiration. EXAM: CHEST - 2 VIEW COMPARISON:  Radiographs 11/21/2022 and 11/12/2022.  CT 11/21/2022. FINDINGS: The heart size and mediastinal contours are normal. The lungs are clear. There is no pleural effusion or pneumothorax. No acute osseous findings are identified. Telemetry leads overlie the chest. There are mild degenerative changes throughout the thoracic spine. IMPRESSION: No active cardiopulmonary process. Electronically Signed   By: Carey Bullocks M.D.   On: 05/14/2023 12:07    Pending Labs Unresulted Labs (From admission, onward)     Start     Ordered   05/14/23 1549  D-dimer, quantitative  Once,   R        05/14/23 1551            Vitals/Pain Today's Vitals   05/14/23 1430 05/14/23 1445 05/14/23 1500 05/14/23 1537  BP: 134/66 132/60 110/68   Pulse: 63 65 72   Resp: 17 18 12    Temp:    97.7 F (36.5 C)  TempSrc:    Oral  SpO2: 98% 100% 97%     Isolation Precautions No active isolations  Medications Medications - No data to display  Mobility walks     Focused Assessments Cardiac Assessment Handoff:    Lab Results  Component Value Date   CKTOTAL 124 03/22/2022   CKMB 2.9 05/05/2019   TROPONINI <0.03 12/08/2017   Lab Results  Component Value Date   DDIMER 0.39 06/05/2019  Does the Patient currently have chest pain? No    R Recommendations: See Admitting Provider Note  Report given to:   Additional Notes:  having dizziness, negative trops, AKI

## 2023-05-14 NOTE — H&P (Signed)
History and Physical    [name]:[id number] DOB: 1961-01-03 DOA: 05/14/2023  PCP: Oretha Milch D, FNP  Patient coming from: HOME I have personally briefly reviewed patient's old medical records in [facility]  Chief Complaint: chest pain  improved with nitro  HPI: [name] is a 62 y.o. male with medical history significant of  CAD s/p DES 2017, CVA, DMII insulin dependent, CHFpef, DV/PET,Depression, HLD, HTN, OSA, hx of pancreatitis, who presents to ED BIB with complaint of substernal chest pain associated with episode of presyncope. Patient describes pain as   and worse with deep breathing. Patient also has recent interim history of diagnosis of COVID 10 days ago. He states prior b to onset of chest pain today he felt at his prior baseline. He does note that he has intermittent chest pain from time to time but was concerned with this episode because it was associated with lightheadedness. He states pain is substernal and sharp. He states it felt like his typical angina and was relieved by nitroglycerin.Patient on ros denies , fever/chills/ n/v/d /abdominal pain  but does note lingering cough. He states overall his upper respiratory symptoms have all resolved except for occasional congested cough.  ED Course:  Vitals: afeb, bp 115/69, hr 64, rr 17  sat 98%  EKG nsr no hyperacute st-twave changes CXR: NAD Labs:  Wbc: 10.2, hgb 12.1 (11.7), plt 231  Na 137, K 4.1, cl 99, bicarb 23, glu 236, cr 4 was 2.56 (01/29/23) Trop6,6 CT chest pending  D-dimer pending   Review of Systems: As per HPI otherwise 10 point review of systems negative.   Past Medical History:  Diagnosis Date   Anemia    Arthritis    Back pain    CAD (coronary artery disease)    a. s/p DES to LAD in 05/2016   Cervical radiculopathy    Chronic diastolic CHF (congestive heart failure) (HCC)    Chronic pain    Deep vein thrombosis (HCC) 01/06/2016   Formatting of this note might be different  from the original.  Formatting of this note might be different from the original. Last Assessment & Plan: Management per primary care. Last Assessment & Plan: Management per primary care.  Formatting of this note might be different from the original. Last Assessment & Plan: Management per primary care. Last Assessment & Plan: Management per primary care.   Depression    DVT (deep venous thrombosis) (HCC)    Hematemesis    Hepatic steatosis    Hyperlipidemia    Hypertension    IBS (irritable bowel syndrome)    Morbid obesity (HCC)    OSA (obstructive sleep apnea)    Pancreatitis    PE (pulmonary thromboembolism) (HCC)    Peripheral neuropathy    PUD (peptic ulcer disease)    Pulmonary embolism (HCC) 07/15/2021   Renal disorder    Stroke [facility])    a. ?details unclear - not seen on imaging when he was admitted in 05/2017 for TIA symptoms which were felt due to cervical radiculopathy.   Thoracic aortic ectasia (HCC)    a. 4.3cm ectatic ascending thoracic aorta by CT 06/2017.    Type 2 diabetes mellitus (HCC)     Past Surgical History:  Procedure Laterality Date   CARDIAC CATHETERIZATION N/A 05/31/2016   Procedure: Left Heart Cath and Coronary Angiography;  Surgeon: Peter M Swaziland, MD;  Location: [facility] INVASIVE CV LAB;  Service: Cardiovascular;  Laterality: N/A;   CARDIAC CATHETERIZATION N/A  05/31/2016   Procedure: Intravascular Pressure Wire/FFR Study;  Surgeon: Peter M Swaziland, MD;  Location: [facility] INVASIVE CV LAB;  Service: Cardiovascular;  Laterality: N/A;   CARDIAC CATHETERIZATION N/A 05/31/2016   Procedure: Coronary Stent Intervention;  Surgeon: Peter M Swaziland, MD;  Location: [facility] INVASIVE CV LAB;  Service: Cardiovascular;  Laterality: N/A;   COLONOSCOPY N/A 07/17/2021   Procedure: COLONOSCOPY;  Surgeon: Charlott Rakes, MD;  Location: [facility] ENDOSCOPY;  Service: Endoscopy;  Laterality: N/A;   COLONOSCOPY WITH PROPOFOL N/A 04/29/2020   Procedure: COLONOSCOPY WITH PROPOFOL;  Surgeon: Charlott Rakes,  MD;  Location: [facility] ENDOSCOPY;  Service: Endoscopy;  Laterality: N/A;   ESOPHAGOGASTRODUODENOSCOPY N/A 04/29/2020   Procedure: ESOPHAGOGASTRODUODENOSCOPY (EGD);  Surgeon: Charlott Rakes, MD;  Location: [facility] ENDOSCOPY;  Service: Endoscopy;  Laterality: N/A;   ESOPHAGOGASTRODUODENOSCOPY (EGD) WITH PROPOFOL N/A 07/17/2021   Procedure: ESOPHAGOGASTRODUODENOSCOPY (EGD) WITH PROPOFOL;  Surgeon: Charlott Rakes, MD;  Location: [facility] ENDOSCOPY;  Service: Endoscopy;  Laterality: N/A;   GIVENS CAPSULE STUDY N/A 11/10/2022   Procedure: GIVENS CAPSULE STUDY;  Surgeon: Kathi Der, MD;  Location: [facility] ENDOSCOPY;  Service: Gastroenterology;  Laterality: N/A;   LEFT HEART CATH AND CORONARY ANGIOGRAPHY N/A 12/08/2017   Procedure: LEFT HEART CATH AND CORONARY ANGIOGRAPHY;  Surgeon: Marykay Lex, MD;  Location: [facility] INVASIVE CV LAB;  Service: Cardiovascular;  Laterality: N/A;   LEFT HEART CATHETERIZATION WITH CORONARY ANGIOGRAM N/A 02/03/2014   Procedure: LEFT HEART CATHETERIZATION WITH CORONARY ANGIOGRAM;  Surgeon: Chrystie Nose, MD;  Location: [facility] CATH LAB;  Service: Cardiovascular;  Laterality: N/A;   left leg stent      POLYPECTOMY  04/29/2020   Procedure: POLYPECTOMY;  Surgeon: Charlott Rakes, MD;  Location: [facility] ENDOSCOPY;  Service: Endoscopy;;   POLYPECTOMY  07/17/2021   Procedure: POLYPECTOMY;  Surgeon: Charlott Rakes, MD;  Location: [facility] ENDOSCOPY;  Service: Endoscopy;;     reports that he has never smoked. He has been exposed to tobacco smoke. He has never used smokeless tobacco. He reports current drug use. Drug: Marijuana. He reports that he does not drink alcohol.  Allergies  Allergen Reactions   Coconut Fatty Acids Hives   Coconut Flavor [Flavoring Agent] Hives   Ibuprofen Other (See Comments)    Made gastric ulcers worse   Aleve [Naproxen] Other (See Comments)    DUE TO KIDNEYS   Nsaids Other (See Comments)    Stomach ulcers    Vascepa [Icosapent Ethyl] Nausea And Vomiting and Other (See  Comments)    headaches, chest pain, similar to sx of a stroke, hypotension    Other Hives and Rash    Nut Allergy    Family History  Problem Relation Age of Onset   Cancer Father    Hypertension Mother    Diabetes Mother    Breast cancer Mother    Hypertension Brother    Diabetes Brother    Hypertension Sister    Diabetes Sister     Prior to Admission medications   Medication Sig Start Date End Date Taking? Authorizing Provider  albuterol (VENTOLIN HFA) 108 (90 Base) MCG/ACT inhaler Inhale 2 puffs into the lungs as needed for wheezing or shortness of breath.    [provider]  atorvastatin (LIPITOR) 40 MG tablet Take 40 mg by mouth daily.    [provider]  B Complex-C (B-COMPLEX WITH VITAMIN C) tablet Take 1 tablet by mouth daily. 11/16/22   Rolly Salter, MD  benzonatate (TESSALON) 100 MG capsule Take 1 capsule (100 mg total) by mouth 3 (  three) times daily as needed for cough. Do not take with alcohol or while driving or operating heavy machinery.  May cause drowsiness. 05/03/23   Valentino Nose, NP  cyanocobalamin 1000 MCG tablet Take 1,000 mcg by mouth once a week. 03/06/20   [provider]  dicyclomine (BENTYL) 10 MG capsule Take 1 capsule (10 mg total) by mouth 3 (three) times daily as needed for spasms. 11/15/22   Rolly Salter, MD  docusate sodium (COLACE) 100 MG capsule Take 1 capsule (100 mg total) by mouth 2 (two) times daily. Patient taking differently: Take 100 mg by mouth daily as needed for mild constipation. 11/15/22   Rolly Salter, MD  ELIQUIS 5 MG TABS tablet TAKE 1 TABLET BY MOUTH TWICE A DAY 10/03/22   Benjiman Core, MD  fenofibrate (TRICOR) 145 MG tablet TAKE 1 TABLET (145 MG TOTAL) BY MOUTH DAILY. 05/19/22   Ronney Asters, NP  ferrous sulfate 325 (65 FE) MG tablet Take 1 tablet (325 mg total) by mouth daily. 11/16/22 12/16/22  Rolly Salter, MD  furosemide (LASIX) 40 MG tablet Take 40 mg by mouth 2 (two) times daily.     [provider]  gabapentin (NEURONTIN) 800 MG tablet Take 800 mg by mouth 3 (three) times daily.    [provider]  glipiZIDE (GLUCOTROL) 10 MG tablet Take 10 mg by mouth 2 (two) times daily. 05/30/17   [provider]  HUMULIN R U-500 KWIKPEN 500 UNIT/ML kwikpen Inject 95-120 Units into the skin in the morning, at noon, and at bedtime. Per sliding scale 3 times daily with meals 06/04/20   [provider]  Insulin Pen Needle 31G X 5 MM MISC Use 1 needle daily to inject insulin as prescribed 06/18/17   Vassie Loll, MD  isosorbide mononitrate (IMDUR) 30 MG 24 hr tablet TAKE ONE TABLET BY MOUTH DAILY 01/02/23   Hilty, Lisette [name], MD  Menthol, Topical Analgesic, (BIOFREEZE EX) Apply 1 application  topically as needed (knee pain).    [provider]  nitroGLYCERIN (NITROSTAT) 0.4 MG SL tablet Place 1 tablet (0.4 mg total) under the tongue every 5 (five) minutes as needed for chest pain. 02/06/19   Chilton Si, MD  ondansetron (ZOFRAN) 4 MG tablet Take 4 mg by mouth as needed for nausea/vomiting, vomiting or nausea. 04/21/20   [provider]  Oxycodone HCl 20 MG TABS Take 20 mg by mouth every 6 (six) hours as needed (pain).    [provider]  pantoprazole (PROTONIX) 40 MG tablet Take 40 mg by mouth daily. 05/26/22   [provider]  polyethylene glycol (MIRALAX / GLYCOLAX) 17 g packet Take 17 g by mouth daily. Patient taking differently: Take 17 g by mouth daily as needed for mild constipation. 11/17/22   Rolly Salter, MD  potassium chloride (MICRO-K) 10 MEQ CR capsule Take 2 capsules (20 mEq total) by mouth 2 (two) times daily. 07/21/22   Lewayne Bunting, MD  sertraline (ZOLOFT) 50 MG tablet Take 50 mg by mouth every morning. 12/17/20   [provider]  simethicone (MYLICON) 80 MG chewable tablet Chew 1 tablet (80 mg total) by mouth 4 (four) times daily. 11/15/22   Rolly Salter, MD  tiZANidine (ZANAFLEX) 4 MG tablet  Take 4 mg by mouth at bedtime. 03/31/20   [provider]  traZODone (DESYREL) 50 MG tablet Take 1 tablet (50 mg total) by mouth at bedtime. 12/15/16   Servando Snare, MD  Vitamin D, Ergocalciferol, (DRISDOL) 1.25 MG (50000 UT) CAPS capsule Take 50,000 Units by mouth every Monday. 08/15/19   [provider]    Physical Exam: Vitals:   05/14/23 1430 05/14/23 1445 05/14/23 1500 05/14/23 1537  BP: 134/66 132/60 110/68   Pulse: 63 65 72   Resp: 17 18 12    Temp:    97.7 F (36.5 C)  TempSrc:    Oral  SpO2: 98% 100% 97%     Constitutional: NAD, calm, comfortable Vitals:   05/14/23 1430 05/14/23 1445 05/14/23 1500 05/14/23 1537  BP: 134/66 132/60 110/68   Pulse: 63 65 72   Resp: 17 18 12    Temp:    97.7 F (36.5 C)  TempSrc:    Oral  SpO2: 98% 100% 97%    Eyes: PERRL, lids and conjunctivae normal ENMT: Mucous membranes are moist. Posterior pharynx clear of any exudate or lesions.Normal dentition.  Neck: normal, supple, no masses, no thyromegaly Respiratory: clear to auscultation bilaterally, no wheezing, no crackles. Normal respiratory effort. No accessory muscle use.  Cardiovascular: Regular rate and rhythm, no murmurs / rubs / gallops. + trace extremity edema. 2+ pedal pulses.  Abdomen: no tenderness, no masses palpated. No hepatosplenomegaly. Bowel sounds positive.  Musculoskeletal: no clubbing / cyanosis. No joint deformity upper and lower extremities. Good ROM, no contractures. Normal muscle tone.  Skin: no rashes, lesions, ulcers. No induration Neurologic: CN 2-12 grossly intact. Sensation intact,  Strength 5/5 in all 4.  Psychiatric: Normal judgment and insight. Alert and oriented x 3. Normal mood.    Labs on Admission: I have personally reviewed following labs and imaging studies  CBC: Recent Labs  Lab 05/14/23 1206  WBC 10.2  HGB 12.1*  HCT 38.4*  MCV 97.0  PLT 231   Basic Metabolic Panel: Recent Labs  Lab 05/14/23 1206  NA 137  K 4.1  CL 99   CO2 23  GLUCOSE 236*  BUN 63*  CREATININE 4.01*  CALCIUM 9.1   GFR: CrCl cannot be calculated (Unknown ideal weight.). Liver Function Tests: No results for input(s): "AST", "ALT", "ALKPHOS", "BILITOT", "PROT", "ALBUMIN" in the last 168 hours. No results for input(s): "LIPASE", "AMYLASE" in the last 168 hours. No results for input(s): "AMMONIA" in the last 168 hours. Coagulation Profile: No results for input(s): "INR", "PROTIME" in the last 168 hours. Cardiac Enzymes: No results for input(s): "CKTOTAL", "CKMB", "CKMBINDEX", "TROPONINI" in the last 168 hours. BNP (last 3 results) No results for input(s): "PROBNP" in the last 8760 hours. HbA1C: No results for input(s): "HGBA1C" in the last 72 hours. CBG: No results for input(s): "GLUCAP" in the last 168 hours. Lipid Profile: No results for input(s): "CHOL", "HDL", "LDLCALC", "TRIG", "CHOLHDL", "LDLDIRECT" in the last 72 hours. Thyroid Function Tests: No results for input(s): "TSH", "T4TOTAL", "FREET4", "T3FREE", "THYROIDAB" in the last 72 hours. Anemia Panel: No results for input(s): "VITAMINB12", "FOLATE", "FERRITIN", "TIBC", "IRON", "RETICCTPCT" in the last 72 hours. Urine analysis:    Component Value Date/Time   COLORURINE STRAW (A) 01/29/2023 1930   APPEARANCEUR CLEAR 01/29/2023 1930   LABSPEC 1.014 01/29/2023 1930   PHURINE 5.0 01/29/2023 1930   GLUCOSEU >=500 (A) 01/29/2023 1930   HGBUR MODERATE (A) 01/29/2023 1930   BILIRUBINUR NEGATIVE 01/29/2023 1930   KETONESUR NEGATIVE 01/29/2023 1930   PROTEINUR 30 (A) 01/29/2023 1930   UROBILINOGEN 0.2 05/27/2014 2312   NITRITE NEGATIVE 01/29/2023 1930   LEUKOCYTESUR NEGATIVE 01/29/2023 1930    Radiological Exams on Admission: DG Chest 2 View  Result Date: 05/14/2023 CLINICAL DATA:  Chest pain, worsening with deep inspiration. EXAM: CHEST - 2 VIEW COMPARISON:  Radiographs 11/21/2022 and 11/12/2022.  CT 11/21/2022. FINDINGS: The heart size and mediastinal contours are  normal. The lungs are clear. There is no pleural effusion or pneumothorax. No acute osseous findings are identified. Telemetry leads overlie the chest. There are mild degenerative changes throughout the thoracic spine. IMPRESSION: No active cardiopulmonary process. Electronically Signed   By: Carey Bullocks M.D.   On: 05/14/2023 12:07    EKG: Independently reviewed. 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 -hold all nephrotoxic medications -gentle ivf overnight  -strict I/o and daily weight in setting CHF  CHFpef -no acute exacerbation  -continue to hold lasix  -resume as able   CVA -on secondary ppx  with eliquis   Hx of DVT/PE -continue eliquis    Recent Dx of COVID  - s/p dx 10 days ago  - minimal cough  HLD -resume statin    HTN -currently stable   DMII -iss/fs -resume home long acting insulin once med rec complete   GERD -ppi   OSA -at bedtime O2  States uses at home   Chronic dyspnea  -multifactorial osa,CHFpef, hx of PE, obesity hypoventilation syndrome -on chronic home O2 4L -patient states he only uses at home ,he states he does not have portable tank  DVT prophylaxis: eliquis Code Status: full/ as discussed per patient wishes in event of cardiac arrest  Family Communication:  Disposition Plan: patient  expected to be admitted greater than 2 midnights  Consults called: none Admission status: inpatient cardiac tele   Lurline Del MD [facility]   If 7PM-7AM, please contact night-coverage [email or web address] Password [facility]  05/14/2023, 3:46 PM

## 2023-05-14 NOTE — ED Triage Notes (Signed)
Pt arrived via EMS, from home. C/o sternal chest pain, worsening with deep breathing. Took a NTG at home, pain went from 6 to 3/4. Pt recently dx with COVID x10 days ago, states he had cough and a lot of mucus but otherwise felt fine until today. States he wears 4L O2 at home at baseline, but doesn't like to bring it outside of his home.

## 2023-05-15 ENCOUNTER — Other Ambulatory Visit (HOSPITAL_COMMUNITY): Payer: Medicaid Other

## 2023-05-15 DIAGNOSIS — I5032 Chronic diastolic (congestive) heart failure: Secondary | ICD-10-CM

## 2023-05-15 DIAGNOSIS — R079 Chest pain, unspecified: Secondary | ICD-10-CM | POA: Diagnosis not present

## 2023-05-15 DIAGNOSIS — I10 Essential (primary) hypertension: Secondary | ICD-10-CM

## 2023-05-15 DIAGNOSIS — N179 Acute kidney failure, unspecified: Secondary | ICD-10-CM | POA: Diagnosis not present

## 2023-05-15 LAB — BASIC METABOLIC PANEL
Anion gap: 15 (ref 5–15)
BUN: 52 mg/dL — ABNORMAL HIGH (ref 6–20)
CO2: 21 mmol/L — ABNORMAL LOW (ref 22–32)
Calcium: 9.2 mg/dL (ref 8.9–10.3)
Chloride: 101 mmol/L (ref 98–111)
Creatinine, Ser: 2.61 mg/dL — ABNORMAL HIGH (ref 0.61–1.24)
GFR, Estimated: 27 mL/min — ABNORMAL LOW (ref >60)
Glucose, Bld: 302 mg/dL — ABNORMAL HIGH (ref 70–99)
Potassium: 4.7 mmol/L (ref 3.5–5.1)
Sodium: 137 mmol/L (ref 135–145)

## 2023-05-15 LAB — HIV ANTIBODY (ROUTINE TESTING W REFLEX): HIV Screen 4th Generation wRfx: NONREACTIVE

## 2023-05-15 MED ORDER — INSULIN GLARGINE-YFGN 100 UNIT/ML ~~LOC~~ SOLN
50.0000 [IU] | Freq: Every day | SUBCUTANEOUS | Status: DC
  Administered 2023-05-15 – 2023-05-16 (×2): 50 [IU] via SUBCUTANEOUS
  Filled 2023-05-15 (×2): qty 0.5

## 2023-05-15 MED ORDER — METOPROLOL TARTRATE 25 MG PO TABS
25.0000 mg | ORAL_TABLET | Freq: Every day | ORAL | Status: DC
  Administered 2023-05-15 – 2023-05-16 (×2): 25 mg via ORAL
  Filled 2023-05-15 (×2): qty 1

## 2023-05-15 MED ORDER — GABAPENTIN 300 MG PO CAPS
300.0000 mg | ORAL_CAPSULE | Freq: Three times a day (TID) | ORAL | Status: DC
  Administered 2023-05-15 – 2023-05-16 (×4): 300 mg via ORAL
  Filled 2023-05-15 (×4): qty 1

## 2023-05-15 MED ORDER — FENOFIBRATE 160 MG PO TABS
160.0000 mg | ORAL_TABLET | Freq: Every day | ORAL | Status: DC
  Administered 2023-05-15 – 2023-05-16 (×2): 160 mg via ORAL
  Filled 2023-05-15 (×2): qty 1

## 2023-05-15 MED ORDER — IPRATROPIUM-ALBUTEROL 0.5-2.5 (3) MG/3ML IN SOLN
3.0000 mL | Freq: Four times a day (QID) | RESPIRATORY_TRACT | Status: DC
  Administered 2023-05-15: 3 mL via RESPIRATORY_TRACT
  Filled 2023-05-15: qty 3

## 2023-05-15 MED ORDER — ATORVASTATIN CALCIUM 40 MG PO TABS
40.0000 mg | ORAL_TABLET | Freq: Every day | ORAL | Status: DC
  Administered 2023-05-15 – 2023-05-16 (×2): 40 mg via ORAL
  Filled 2023-05-15 (×2): qty 1

## 2023-05-15 MED ORDER — ISOSORBIDE MONONITRATE ER 30 MG PO TB24
30.0000 mg | ORAL_TABLET | Freq: Every day | ORAL | Status: DC
  Administered 2023-05-15 – 2023-05-16 (×2): 30 mg via ORAL
  Filled 2023-05-15 (×2): qty 1

## 2023-05-15 MED ORDER — IPRATROPIUM-ALBUTEROL 0.5-2.5 (3) MG/3ML IN SOLN
3.0000 mL | Freq: Two times a day (BID) | RESPIRATORY_TRACT | Status: DC
  Administered 2023-05-15 – 2023-05-16 (×2): 3 mL via RESPIRATORY_TRACT
  Filled 2023-05-15 (×2): qty 3

## 2023-05-15 MED ORDER — TIZANIDINE HCL 4 MG PO TABS
4.0000 mg | ORAL_TABLET | Freq: Every day | ORAL | Status: DC
  Administered 2023-05-15: 4 mg via ORAL
  Filled 2023-05-15: qty 1

## 2023-05-15 MED ORDER — PANTOPRAZOLE SODIUM 40 MG PO TBEC
40.0000 mg | DELAYED_RELEASE_TABLET | Freq: Every day | ORAL | Status: DC
  Administered 2023-05-15 – 2023-05-16 (×2): 40 mg via ORAL
  Filled 2023-05-15 (×2): qty 1

## 2023-05-15 MED ORDER — TRAZODONE HCL 50 MG PO TABS
50.0000 mg | ORAL_TABLET | Freq: Every day | ORAL | Status: DC
  Administered 2023-05-15: 50 mg via ORAL
  Filled 2023-05-15: qty 1

## 2023-05-15 MED ORDER — ALBUTEROL SULFATE (2.5 MG/3ML) 0.083% IN NEBU: 3.0000 mL | INHALATION_SOLUTION | RESPIRATORY_TRACT | Status: DC | PRN

## 2023-05-15 MED ORDER — METOPROLOL TARTRATE 25 MG PO TABS: 25.0000 mg | ORAL_TABLET | Freq: Every day | ORAL | Status: DC

## 2023-05-15 MED ORDER — SERTRALINE HCL 50 MG PO TABS
50.0000 mg | ORAL_TABLET | Freq: Every morning | ORAL | Status: DC
  Administered 2023-05-15 – 2023-05-16 (×2): 50 mg via ORAL
  Filled 2023-05-15 (×2): qty 1

## 2023-05-15 MED ORDER — APIXABAN 5 MG PO TABS
5.0000 mg | ORAL_TABLET | Freq: Two times a day (BID) | ORAL | Status: DC
  Administered 2023-05-15 – 2023-05-16 (×3): 5 mg via ORAL
  Filled 2023-05-15 (×3): qty 1

## 2023-05-15 NOTE — Progress Notes (Signed)
Progress Note    [name]   UXL:[id number]  DOB: 11/09/1960  DOA: 05/14/2023     1 PCP: Juan Cork, FNP  Initial CC: CP  Hospital Course: [name] is a 62 yo male with PMH chronic hypoxia on 4L home O2, CAD s/p DES 2017, CVA, DMII, CHF, DVT/PE, depression, HLD, HTN, OSA, pancreatitis who presented with SS chest pain and dizziness.  He endorsed worsening pain with deep breaths.  He was recently diagnosed with COVID on 05/03/2023 but was not eligible for molnupiravir per his insurance.  He was therefore recommended for supportive care at home and symptom management. D-dimer was negative and CT chest without contrast was obtained which was negative for acute abnormalities. On workup in the ER he was found to have worsened renal function also; creat 4.01 on admission He was admitted for further chest pain workup and renal function monitoring.   Interval History:  Sitting up in recliner when seen this morning.  States his chest pain had significantly improved since admission.  Denied any shortness of breath.  Does have some increased swelling in his legs. Since getting COVID, he endorsed a decreased appetite, 1 to 2 days of vomiting and diarrhea.  He has also been taking his Lasix during this time.  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 -hold all nephrotoxic medications - patient continue have to have multiple AKIs over past year at least; renal function very labile on recoveries/injuries; seems to be having a baseline 1.5 - 2 at times but did recover fully sometimes too; last AKI was in April 2024 and on 4/16 it was 3.97 but by 4/25 was 1.09 - creat 4.01 on admission; he does seem to be volume  depleted from vomiting, diarrhea, decreased intake, and lasix use - lasix on hold for now - s/p IVF on admission - repeat BMP this am shows creat 2.61 - liberalize diet and patient can eat/drink; holding off on further IVF    CHF - last echo 03/23/22 noted EF 55-60% with normal diastology but hx of diastolic dysfunction -no acute exacerbation; see AKI -continue to hold lasix  -resume as able    CVA -on secondary ppx with eliquis    Hx of DVT/PE -continue eliquis    Recent Dx of COVID  - s/p dx 7/10; seems clinically recovered - minimal cough   HLD -resume statin    HTN -currently stable    DMII - odd home regimen -Last A1c 8.4% on 04/12/2023 -Patient also unclear and unsure how he is using his Humulin R U-500 - For now, will trial Semglee and will adjust regimen as needed   GERD -ppi    OSA -On 4 L chronic O2   Chronic dyspnea  -multifactorial; OSA, dCHF, OHS   Old records reviewed in assessment of this patient  Antimicrobials:   DVT prophylaxis:   apixaban (ELIQUIS) tablet 5 mg   Code Status:   Code Status: Full Code  Mobility Assessment (Last 72 Hours)     Mobility Assessment     Row Name 05/14/23 2000 05/14/23 1720         Does patient have an order for bedrest  or is patient medically unstable No - Continue assessment No - Continue assessment      What is the highest level of mobility based on the progressive mobility assessment? -- Level 5 (Walks with assist in room/hall) - Balance while stepping forward/back and can walk in room with assist - Complete               Barriers to discharge:  Disposition Plan:  Home Status is: Inpt  Objective: Blood pressure (!) 109/51, pulse 78, temperature 97.8 F (36.6 C), temperature source Oral, resp. rate 16, height 5\' 10"  (1.778 m), weight (!) 153.8 kg, SpO2 97%.  Examination:  Physical Exam Constitutional:      General: He is not in acute distress.    Appearance: He is well-developed. He is  obese.  HENT:     Head: Normocephalic and atraumatic.     Mouth/Throat:     Mouth: Mucous membranes are moist.  Eyes:     Extraocular Movements: Extraocular movements intact.  Cardiovascular:     Rate and Rhythm: Normal rate and regular rhythm.  Pulmonary:     Effort: Pulmonary effort is normal. No respiratory distress.     Breath sounds: Normal breath sounds. No wheezing.  Abdominal:     General: Bowel sounds are normal. There is no distension.     Palpations: Abdomen is soft.     Tenderness: There is no abdominal tenderness.  Musculoskeletal:        General: Swelling (B/L LE edema 2+) present. Normal range of motion.     Cervical back: Normal range of motion and neck supple.  Skin:    General: Skin is warm and dry.  Neurological:     General: No focal deficit present.     Mental Status: He is alert.  Psychiatric:        Mood and Affect: Mood normal.        Behavior: Behavior normal.      Consultants:    Procedures:    Data Reviewed: Results for orders placed or performed during the hospital encounter of 05/14/23 (from the past 24 hour(s))  Troponin I (High Sensitivity)     Status: None   Collection Time: 05/14/23  2:05 PM  Result Value Ref Range   Troponin I (High Sensitivity) 6 <18 ng/L  D-dimer, quantitative     Status: None   Collection Time: 05/14/23  4:16 PM  Result Value Ref Range   D-Dimer, Quant <0.27 0.00 - 0.50 ug/mL-FEU  HIV Antibody (routine testing w rflx)     Status: None   Collection Time: 05/14/23  7:06 PM  Result Value Ref Range   HIV Screen 4th Generation wRfx Non Reactive Non Reactive  Troponin I (High Sensitivity)     Status: None   Collection Time: 05/14/23  7:06 PM  Result Value Ref Range   Troponin I (High Sensitivity) 6 <18 ng/L  Troponin I (High Sensitivity)     Status: None   Collection Time: 05/14/23  8:35 PM  Result Value Ref Range   Troponin I (High Sensitivity) 6 <18 ng/L  Basic metabolic panel     Status: Abnormal    Collection Time: 05/15/23 10:52 AM  Result Value Ref Range   Sodium 137 135 - 145 mmol/L   Potassium 4.7 3.5 - 5.1 mmol/L   Chloride 101 98 - 111 mmol/L   CO2 21 (L) 22 - 32 mmol/L   Glucose, Bld 302 (H) 70 - 99 mg/dL   BUN  52 (H) 6 - 20 mg/dL   Creatinine, Ser 1.61 (H) 0.61 - 1.24 mg/dL   Calcium 9.2 8.9 - 09.6 mg/dL   GFR, Estimated 27 (L) >60 mL/min   Anion gap 15 5 - 15    I have reviewed pertinent nursing notes, vitals, labs, and images as necessary. I have ordered labwork to follow up on as indicated.  I have reviewed the last notes from staff over past 24 hours. I have discussed patient's care plan and test results with nursing staff, CM/SW, and other staff as appropriate.  Time spent: Greater than 50% of the 55 minute visit was spent in counseling/coordination of care for the patient as laid out in the A&P.   LOS: 1 day   Lewie Chamber, MD [facility] 05/15/2023, 1:35 PM

## 2023-05-15 NOTE — Hospital Course (Addendum)
[name] is a 62 yo male with PMH chronic hypoxia on 4L home O2, CAD s/p DES 2017, CVA, DMII, CHF, DVT/PE, depression, HLD, HTN, OSA, pancreatitis who presented with SS chest pain and dizziness.  He endorsed worsening pain with deep breaths.  He was recently diagnosed with COVID on 05/03/2023 but was not eligible for molnupiravir per his insurance.  He was therefore recommended for supportive care at home and symptom management. D-dimer was negative and CT chest without contrast was obtained which was negative for acute abnormalities. On workup in the ER he was found to have worsened renal function also; creat 4.01 on admission He was admitted for further chest pain workup and renal function monitoring.

## 2023-05-15 NOTE — Progress Notes (Signed)
Mobility Specialist - Progress Note  Pre-mobility: 89 bpm HR, During mobility: 129 bpm HR, Post-mobility: 92 bpm HR,   Nurse requested Mobility Specialist to perform oxygen saturation test with pt which includes removing pt from oxygen both at rest and while ambulating.  Below are the results from that testing.     Patient Saturations on Room Air at Rest = spO2 96%  Patient Saturations on Room Air while Ambulating = sp02 87-91% .  At end of testing pt left in room on 4  Liters of oxygen.  Reported results to nurse.     05/15/23 1345  Oxygen Therapy  O2 Device Room Air  Patient Activity (if Appropriate) Ambulating  Mobility  Activity Ambulated independently in hallway  Level of Assistance Standby assist, set-up cues, supervision of patient - no hands on  Assistive Device None  Distance Ambulated (ft) 350 ft  Range of Motion/Exercises Active  Activity Response Tolerated well  Mobility Referral Yes  $Mobility charge 1 Mobility  Mobility Specialist Start Time (ACUTE ONLY) 1335  Mobility Specialist Stop Time (ACUTE ONLY) 1345  Mobility Specialist Time Calculation (min) (ACUTE ONLY) 10 min   Pt was found on recliner chair and agreeable to ambulate. C/o back and hip pain during session and towards EOS c/o SOB. SPO2 would drop to 87% during ambulation and increase back up to 91% without needing to stop. At EOS returned to recliner chair with all needs met. Call bell in reach.   Billey Chang Mobility Specialist

## 2023-05-15 NOTE — TOC Initial Note (Signed)
Transition of Care [facility]) - Initial/Assessment Note    Patient Details  Name: [name] MRN: [id number] Date of Birth: 10/12/1961  Transition of Care [facility]) CM/SW Contact:    Howell Rucks, RN Phone Number: 05/15/2023, 11:26 AM  Clinical Narrative: Call to pt in room to introduce role of TOC/NCM and review for dc planning. Pt reports he has a PCP and pharmacy in place, no home care services, reports he has a cane a walker at home he uses as needed. Pt reports he has home 02 through [employer], reports he has tanks at home but no portable 02. NCM outreached to Mitch at [employer], confirmed pt on service for 02, will check into portable 02 and call NCM back. TOC will continue to follow.                         Patient Goals and CMS Choice            Expected Discharge Plan and Services                                              Prior Living Arrangements/Services                       Activities of Daily Living Home Assistive Devices/Equipment: Wheelchair, Environmental consultant (specify type) ADL Screening (condition at time of admission) Patient's cognitive ability adequate to safely complete daily activities?: No Is the patient deaf or have difficulty hearing?: No Does the patient have difficulty seeing, even when wearing glasses/contacts?: No Does the patient have difficulty concentrating, remembering, or making decisions?: No Patient able to express need for assistance with ADLs?: Yes Does the patient have difficulty dressing or bathing?: No Independently performs ADLs?: Yes (appropriate for developmental age) Does the patient have difficulty walking or climbing stairs?: Yes Weakness of Legs: Both Weakness of Arms/Hands: None  Permission Sought/Granted                  Emotional Assessment              Admission diagnosis:  AKI (acute kidney injury) (HCC) [N17.9] Patient Active Problem List   Diagnosis Date Noted   AKI (acute kidney injury)  (HCC) 05/14/2023   Paresthesias 11/22/2022   Hypotension 11/21/2022   Acute kidney injury superimposed on chronic kidney disease (HCC) 11/21/2022   GI bleeding 11/10/2022   History of pulmonary embolism 11/10/2022   Right-sided Bell's palsy 10/11/2022   Class 3 obesity (HCC) 08/16/2022   Diplopia 08/15/2022   Carpal tunnel syndrome on both sides 03/14/2022   Diabetic ulcer of lower leg (HCC) 06/26/2021   Lumbar radiculitis 03/30/2021   Morbid obesity (HCC) 01/15/2021   Personal history of colonic polyps 01/15/2021   Pain due to onychomycosis of toenails of both feet 01/15/2021   Epistaxis, recurrent 10/21/2020   Laceration of nose 10/21/2020   OSA (obstructive sleep apnea)    Bilateral carotid artery stenosis 07/28/2020   Bilateral lower extremity edema 07/28/2020   CHF (congestive heart failure) (HCC) 07/28/2020   DDD (degenerative disc disease), lumbar 07/28/2020   Diabetic peripheral neuropathy (HCC) 07/28/2020   Fatty liver 07/28/2020   Irritable bowel syndrome with diarrhea 07/28/2020   Osteoarthritis of right hip 07/28/2020   Mixed diabetic hyperlipidemia associated with type 2 diabetes mellitus (HCC) 04/27/2020  Rotator cuff arthropathy 10/30/2019   Cervical radiculopathy 09/24/2019   Trochanteric bursitis of right hip 09/24/2019   Chronic diastolic CHF (congestive heart failure) (HCC) 08/23/2019   ARF (acute renal failure) (HCC) 08/01/2019   Hyperkalemia 08/01/2019   Uncontrolled type 2 diabetes mellitus with hyperglycemia (HCC) 08/01/2019   Trochanteric bursitis 02/11/2019   Long-term insulin use (HCC) 01/25/2019   Neck pain 10/09/2018   Left arm weakness 10/09/2018   Decreased sensation of lower extremity 09/12/2018   B12 deficiency 08/10/2017   Persistent headaches 08/09/2017   GERD (gastroesophageal reflux disease) 06/29/2017   Left arm numbness    History of CVA (cerebrovascular accident) 06/17/2017   TIA (transient ischemic attack) 06/17/2017   Degenerative  joint disease involving multiple joints 04/09/2017   Foraminal stenosis of lumbar region 01/19/2017   Chronic back pain 12/29/2016   Depression 12/15/2016   Vitamin D deficiency 12/09/2016   Right hip pain 12/07/2016   Hypokalemia 12/07/2016   Type 2 diabetes mellitus with vascular disease (HCC) 05/31/2016   Normocytic normochromic anemia 05/31/2016   Chest pain 05/31/2016   Coronary artery disease involving native coronary artery of native heart without angina pectoris 01/06/2016   Chronic pancreatitis (HCC) 03/21/2014   Edema 03/21/2014   Chest pain, non-cardiac 01/29/2014   DM2 (diabetes mellitus, type 2) (HCC) 01/29/2014   Obesity, Class III, BMI 40-49.9 (morbid obesity) (HCC) 01/29/2014   Snoring 01/29/2014   Dyslipidemia 01/29/2014   HTN (hypertension) 01/29/2014   Abnormal nuclear stress test 01/29/2014   Benign essential hypertension 08/15/2013   PCP:  Hilton Cork, FNP Pharmacy:   [facility] - [place], [place] - [address] [facility] [address] [place] [address] Phone: [phone] Fax: [phone]     Social Determinants of Health (SDOH) Social History: SDOH Screenings   Food Insecurity: No Food Insecurity (05/14/2023)  Housing: Low Risk  (05/14/2023)  Transportation Needs: No Transportation Needs (05/14/2023)  Utilities: Not At Risk (05/14/2023)  Financial Resource Strain: Not on File (02/10/2022)   Received from [place], [place]  Physical Activity: Not on File (02/10/2022)   Received from [place], [place]  Social Connections: Unknown (12/21/2022)   Received from [facility]  Stress: Not on File (02/10/2022)   Received from [place], [place]  Tobacco Use: Low Risk  (05/14/2023)   SDOH Interventions:     Readmission Risk Interventions    05/15/2023   11:25 AM 11/23/2022    9:16 AM 06/29/2021   11:43 AM  Readmission Risk Prevention Plan  Transportation Screening Complete Complete Complete  PCP or Specialist Appt within 3-5 Days  Complete    HRI or Home Care Consult Complete    Social Work Consult for Recovery Care Planning/Counseling Complete    Palliative Care Screening Not Applicable    Medication Review Oceanographer) Complete Complete Complete  PCP or Specialist appointment within 3-5 days of discharge  Complete Complete  HRI or Home Care Consult  Complete Complete  SW Recovery Care/Counseling Consult  Complete Complete  Palliative Care Screening  Not Applicable Not Applicable  Skilled Nursing Facility  Not Applicable Not Applicable

## 2023-05-15 NOTE — Progress Notes (Signed)
   05/15/23 2228  BiPAP/CPAP/SIPAP  Reason BIPAP/CPAP not in use Non-compliant (PT prefers just 02)

## 2023-05-16 ENCOUNTER — Ambulatory Visit: Payer: Medicaid Other | Admitting: Orthopaedic Surgery

## 2023-05-16 ENCOUNTER — Inpatient Hospital Stay (HOSPITAL_COMMUNITY): Payer: Medicaid Other

## 2023-05-16 DIAGNOSIS — E1122 Type 2 diabetes mellitus with diabetic chronic kidney disease: Secondary | ICD-10-CM | POA: Diagnosis not present

## 2023-05-16 DIAGNOSIS — N1832 Chronic kidney disease, stage 3b: Secondary | ICD-10-CM

## 2023-05-16 DIAGNOSIS — Z794 Long term (current) use of insulin: Secondary | ICD-10-CM

## 2023-05-16 DIAGNOSIS — N189 Chronic kidney disease, unspecified: Secondary | ICD-10-CM

## 2023-05-16 DIAGNOSIS — R079 Chest pain, unspecified: Secondary | ICD-10-CM | POA: Diagnosis not present

## 2023-05-16 DIAGNOSIS — N179 Acute kidney failure, unspecified: Secondary | ICD-10-CM | POA: Diagnosis not present

## 2023-05-16 LAB — BASIC METABOLIC PANEL
Anion gap: 12 (ref 5–15)
BUN: 52 mg/dL — ABNORMAL HIGH (ref 6–20)
CO2: 22 mmol/L (ref 22–32)
Calcium: 9.2 mg/dL (ref 8.9–10.3)
Chloride: 99 mmol/L (ref 98–111)
Creatinine, Ser: 2.77 mg/dL — ABNORMAL HIGH (ref 0.61–1.24)
GFR, Estimated: 26 mL/min — ABNORMAL LOW (ref >60)
Glucose, Bld: 329 mg/dL — ABNORMAL HIGH (ref 70–99)
Potassium: 4.8 mmol/L (ref 3.5–5.1)
Sodium: 133 mmol/L — ABNORMAL LOW (ref 135–145)

## 2023-05-16 LAB — CBC WITH DIFFERENTIAL/PLATELET
Abs Immature Granulocytes: 0.03 10*3/uL (ref 0.00–0.07)
Basophils Absolute: 0 10*3/uL (ref 0.0–0.1)
Basophils Relative: 1 %
Eosinophils Absolute: 0.1 10*3/uL (ref 0.0–0.5)
Eosinophils Relative: 2 %
HCT: 36.8 % — ABNORMAL LOW (ref 39.0–52.0)
Hemoglobin: 11.4 g/dL — ABNORMAL LOW (ref 13.0–17.0)
Immature Granulocytes: 0 %
Lymphocytes Relative: 35 %
Lymphs Abs: 3.1 10*3/uL (ref 0.7–4.0)
MCH: 30.6 pg (ref 26.0–34.0)
MCHC: 31 g/dL (ref 30.0–36.0)
MCV: 98.9 fL (ref 80.0–100.0)
Monocytes Absolute: 1 10*3/uL (ref 0.1–1.0)
Monocytes Relative: 11 %
Neutro Abs: 4.5 10*3/uL (ref 1.7–7.7)
Neutrophils Relative %: 51 %
Platelets: 207 10*3/uL (ref 150–400)
RBC: 3.72 MIL/uL — ABNORMAL LOW (ref 4.22–5.81)
RDW: 14.3 % (ref 11.5–15.5)
WBC: 8.8 10*3/uL (ref 4.0–10.5)
nRBC: 0 % (ref 0.0–0.2)

## 2023-05-16 LAB — MAGNESIUM: Magnesium: 2 mg/dL (ref 1.7–2.4)

## 2023-05-16 LAB — GLUCOSE, CAPILLARY
Glucose-Capillary: 306 mg/dL — ABNORMAL HIGH (ref 70–99)
Glucose-Capillary: 328 mg/dL — ABNORMAL HIGH (ref 70–99)

## 2023-05-16 MED ORDER — INSULIN ASPART 100 UNIT/ML IJ SOLN
0.0000 [IU] | Freq: Three times a day (TID) | INTRAMUSCULAR | Status: DC
  Administered 2023-05-16 (×2): 15 [IU] via SUBCUTANEOUS

## 2023-05-16 MED ORDER — INSULIN ASPART 100 UNIT/ML IJ SOLN
12.0000 [IU] | Freq: Three times a day (TID) | INTRAMUSCULAR | Status: DC
  Administered 2023-05-16 (×2): 12 [IU] via SUBCUTANEOUS

## 2023-05-16 NOTE — Progress Notes (Signed)
Pt discharged to home at this time. Prior to DC, IV and tele were removed. Pt was given DC paperwork regarding medications condition, and follow up appointments. Pt verbalized understanding and stated no concerns at the time. Pt stable at time of DC and left in personal vehicle driven by son.

## 2023-05-16 NOTE — TOC Transition Note (Signed)
Transition of Care [facility]) - CM/SW Discharge Note   Patient Details  Name: [name] MRN: [id number] Date of Birth: [birth year]  Transition of Care [facility]) CM/SW Contact:  Howell Rucks, RN Phone Number: 05/16/2023, 11:16 AM   Clinical Narrative:  DC home order.  NCM outreached to Mitch at [facility], confirmed pt will be contacted to scheduled home visit to set up portable 02 concentrator.  No further TOC needs identified.     Final next level of care: Home/Self Care Barriers to Discharge: Barriers Resolved   Patient Goals and CMS Choice      Discharge Placement                         Discharge Plan and Services Additional resources added to the After Visit Summary for                    DME Agency: [employer] Date DME Agency Contacted: 05/16/23 Time DME Agency Contacted: [phone] Representative spoke with at DME Agency: Marthann Schiller            Social Determinants of Health (SDOH) Interventions SDOH Screenings   Food Insecurity: No Food Insecurity (05/14/2023)  Housing: Low Risk  (05/14/2023)  Transportation Needs: No Transportation Needs (05/14/2023)  Utilities: Not At Risk (05/14/2023)  Financial Resource Strain: Not on File (02/10/2022)   Received from [place], [place]  Physical Activity: Not on File (02/10/2022)   Received from [place], [place]  Social Connections: Unknown (12/21/2022)   Received from [facility]  Stress: Not on File (02/10/2022)   Received from [place], [place]  Tobacco Use: Low Risk  (05/14/2023)     Readmission Risk Interventions    05/15/2023   11:25 AM 11/23/2022    9:16 AM 06/29/2021   11:43 AM  Readmission Risk Prevention Plan  Transportation Screening Complete Complete Complete  PCP or Specialist Appt within 3-5 Days Complete    HRI or Home Care Consult Complete    Social Work Consult for Recovery Care Planning/Counseling Complete    Palliative Care Screening Not Applicable    Medication Review Oceanographer) Complete Complete  Complete  PCP or Specialist appointment within 3-5 days of discharge  Complete Complete  HRI or Home Care Consult  Complete Complete  SW Recovery Care/Counseling Consult  Complete Complete  Palliative Care Screening  Not Applicable Not Applicable  Skilled Nursing Facility  Not Applicable Not Applicable

## 2023-05-16 NOTE — Discharge Summary (Signed)
Physician Discharge Summary   [name] JYN:[id number] DOB: Nov 17, 1960 DOA: 05/14/2023  PCP: Hilton Cork, FNP  Admit date: 05/14/2023 Discharge date: 05/16/2023  Admitted From: Home Disposition:  Home Discharging physician: Lewie Chamber, MD Barriers to discharge: none  Recommendations at discharge: Repeat BMP Follow up with nephrology Adjust insulin regimen further with endo as needed  Discharge Condition: stable CODE STATUS: Full Diet recommendation:  Diet Orders (From admission, onward)     Start     Ordered   05/16/23 0000  Diet Carb Modified        05/16/23 0921   05/15/23 1035  Diet regular Fluid consistency: Thin  Diet effective now       Question:  Fluid consistency:  Answer:  Thin   05/15/23 1034            Hospital Course: [name] is a 62 yo male with PMH chronic hypoxia on 4L home O2, CAD s/p DES 2017, CVA, DMII, CHF, DVT/PE, depression, HLD, HTN, OSA, pancreatitis who presented with SS chest pain and dizziness.  He endorsed worsening pain with deep breaths.  He was recently diagnosed with COVID on 05/03/2023 but was not eligible for molnupiravir per his insurance.  He was therefore recommended for supportive care at home and symptom management. D-dimer was negative and CT chest without contrast was obtained which was negative for acute abnormalities. On workup in the ER he was found to have worsened renal function also; creat 4.01 on admission He was admitted for further chest pain workup and renal function monitoring.   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 -hold all nephrotoxic medications - patient continue have to have multiple AKIs over past year at least; renal function very labile on  recoveries/injuries; seems to be having a baseline 1.5 - 2 at times but did recover fully sometimes too; last AKI was in April 2024 and on 4/16 it was 3.97 but by 4/25 was 1.09 - creat 4.01 on admission; he does seem to be volume depleted from vomiting, diarrhea, decreased intake, and lasix use - lasix on hold for now; recommended to resume in ~2 days after discharge - creat improved to 2.7 prior to discharge - repeat BMP at follow up  Chronic dCHF - last echo 03/23/22 noted EF 55-60% with normal diastology but hx of diastolic dysfunction -no acute exacerbation; see AKI -Okay to resume Lasix approximately 2 days after discharge   History of CVA -on secondary ppx with eliquis    Hx of DVT/PE -continue eliquis    Recent Dx of COVID  - s/p dx 7/10; seems clinically recovered - minimal cough   HLD -Continue statin    HTN -currently stable    DMII - odd home regimen; not sure if patient understands sliding scale well -Last A1c 8.4% on 04/12/2023 - continue Humulin R U-500, but recommended he discuss further with endocrine if confused on sliding scale   GERD -ppi    OSA -On 4 L chronic O2   Chronic dyspnea  -multifactorial; OSA, dCHF, OHS   The patient's chronic medical conditions were treated accordingly per the patient's home medication regimen except as noted.  On day of discharge, patient was felt deemed stable for discharge.  Patient/family member advised to call PCP or come back to ER if needed.   Principal Diagnosis: Chest pain  Discharge Diagnoses: Active Hospital Problems   Diagnosis Date Noted   Chest pain 05/31/2016    Priority: 1.   Acute kidney injury superimposed on chronic kidney disease (HCC) 11/21/2022    Priority: 3.   AKI (acute kidney injury) (HCC) 05/14/2023   History of pulmonary embolism 11/10/2022   OSA (obstructive sleep apnea)    CHF (congestive heart failure) (HCC) 07/28/2020   Diabetic peripheral neuropathy (HCC) 07/28/2020   Chronic  diastolic CHF (congestive heart failure) (HCC) 08/23/2019   History of CVA (cerebrovascular accident) 06/17/2017   Depression 12/15/2016   Coronary artery disease involving native coronary artery of native heart without angina pectoris 01/06/2016   DM2 (diabetes mellitus, type 2) (HCC) 01/29/2014   Dyslipidemia 01/29/2014   HTN (hypertension) 01/29/2014   Obesity, Class III, BMI 40-49.9 (morbid obesity) (HCC) 01/29/2014   Benign essential hypertension 08/15/2013    Resolved Hospital Problems  No resolved problems to display.     Discharge Instructions     Diet Carb Modified   Complete by: As directed    Increase activity slowly   Complete by: As directed       Allergies as of 05/16/2023       Reactions   Coconut Fatty Acids Hives   Coconut Flavor [flavoring Agent] Hives   Ibuprofen Other (See Comments)   Made gastric ulcers worse   Aleve [naproxen] Other (See Comments)   DUE TO KIDNEYS   Nsaids Other (See Comments)   Stomach ulcers   Vascepa [icosapent Ethyl] Nausea And Vomiting, Other (See Comments)   headaches, chest pain, similar to sx of a stroke, hypotension    Other Hives, Rash   Nut Allergy        Medication List     TAKE these medications    albuterol 108 (90 Base) MCG/ACT inhaler Commonly known as: VENTOLIN HFA Inhale 2 puffs into the lungs as needed for wheezing or shortness of breath.   atorvastatin 40 MG tablet Commonly known as: LIPITOR Take 40 mg by mouth daily.   B-complex with vitamin C tablet Take 1 tablet by mouth daily.   BIOFREEZE EX Apply 1 application  topically as needed (knee pain).   Combivent Respimat 20-100 MCG/ACT Aers respimat Generic drug: Ipratropium-Albuterol Inhale 1 puff into the lungs 4 (four) times daily.   Eliquis 5 MG Tabs tablet Generic drug: apixaban TAKE 1 TABLET BY MOUTH TWICE A DAY   fenofibrate 145 MG tablet Commonly known as: TRICOR TAKE 1 TABLET (145 MG TOTAL) BY MOUTH DAILY.   ferrous sulfate 325  (65 FE) MG tablet Take 1 tablet (325 mg total) by mouth daily.   furosemide 40 MG tablet Commonly known as: LASIX Take 40 mg by mouth 2 (two) times daily.   gabapentin 800 MG tablet Commonly known as: NEURONTIN Take 800 mg by mouth 3 (three) times daily.   glipiZIDE 10 MG tablet Commonly known as: GLUCOTROL Take 10 mg by mouth 2 (two) times daily.   HumuLIN R U-500 KwikPen 500 UNIT/ML KwikPen Generic drug: insulin regular human CONCENTRATED Inject 95-120 Units into the skin in the morning, at noon, and at bedtime. Per sliding scale 3 times daily with meals   Insulin Pen Needle 31G X 5 MM Misc Use 1 needle daily to inject insulin as prescribed   isosorbide mononitrate 30 MG 24 hr tablet Commonly known as: IMDUR TAKE ONE TABLET  BY MOUTH DAILY   lisinopril 20 MG tablet Commonly known as: ZESTRIL Take 20 mg by mouth daily.   metoprolol tartrate 25 MG tablet Commonly known as: LOPRESSOR Take 25 mg by mouth daily.   nitroGLYCERIN 0.4 MG SL tablet Commonly known as: NITROSTAT Place 1 tablet (0.4 mg total) under the tongue every 5 (five) minutes as needed for chest pain.   ondansetron 4 MG tablet Commonly known as: ZOFRAN Take 4 mg by mouth as needed for nausea/vomiting, vomiting or nausea.   Oxycodone HCl 20 MG Tabs Take 20 mg by mouth every 6 (six) hours as needed (pain).   pantoprazole 40 MG tablet Commonly known as: PROTONIX Take 40 mg by mouth daily.   potassium chloride 10 MEQ CR capsule Commonly known as: MICRO-K Take 2 capsules (20 mEq total) by mouth 2 (two) times daily.   sertraline 50 MG tablet Commonly known as: ZOLOFT Take 50 mg by mouth every morning.   tiZANidine 4 MG tablet Commonly known as: ZANAFLEX Take 4 mg by mouth at bedtime.   traZODone 50 MG tablet Commonly known as: DESYREL Take 1 tablet (50 mg total) by mouth at bedtime.   Vitamin D (Ergocalciferol) 1.25 MG (50000 UNIT) Caps capsule Commonly known as: DRISDOL Take 50,000 Units by  mouth every Monday.               Durable Medical Equipment  (From admission, onward)           Start     Ordered   05/16/23 0920  For home use only DME oxygen  Once       Question Answer Comment  Length of Need Lifetime   Mode or (Route) Nasal cannula   Liters per Minute 4   Frequency Continuous (stationary and portable oxygen unit needed)   Oxygen conserving device Yes   Oxygen delivery system Gas      05/16/23 0920            Follow-up Information     Hilton Cork, FNP Follow up in 1 week(s).   Specialty: Family Medicine Why: Recheck BMP; adjust insulin regimen if needed Contact information: [address] [place] [address] [phone]         [facility] Follow up.   Why: [facility]  [address], [place] , [place] [address]    [phone]  Home Oxygen Contact information: [address] [place] [address] [phone]                Allergies  Allergen Reactions   Coconut Fatty Acids Hives   Coconut Flavor [Flavoring Agent] Hives   Ibuprofen Other (See Comments)    Made gastric ulcers worse   Aleve [Naproxen] Other (See Comments)    DUE TO KIDNEYS   Nsaids Other (See Comments)    Stomach ulcers    Vascepa [Icosapent Ethyl] Nausea And Vomiting and Other (See Comments)    headaches, chest pain, similar to sx of a stroke, hypotension    Other Hives and Rash    Nut Allergy    Consultations:   Procedures:   Discharge Exam: BP 107/65 (BP Location: Right Arm)   Pulse 65   Temp 98 F (36.7 C) (Oral)   Resp 18   Ht 5\' 10"  (1.778 m)   Wt (!) 153.8 kg   SpO2 94%   BMI 48.65 kg/m  Physical Exam Constitutional:      General: He is  not in acute distress.    Appearance: He is well-developed. He is obese.  HENT:     Head: Normocephalic and atraumatic.     Mouth/Throat:     Mouth: Mucous membranes are moist.  Eyes:     Extraocular Movements: Extraocular movements  intact.  Cardiovascular:     Rate and Rhythm: Normal rate and regular rhythm.  Pulmonary:     Effort: Pulmonary effort is normal. No respiratory distress.     Breath sounds: Normal breath sounds. No wheezing.  Abdominal:     General: Bowel sounds are normal. There is no distension.     Palpations: Abdomen is soft.     Tenderness: There is no abdominal tenderness.  Musculoskeletal:        General: Swelling (B/L LE edema 2+) present. Normal range of motion.     Cervical back: Normal range of motion and neck supple.  Skin:    General: Skin is warm and dry.  Neurological:     General: No focal deficit present.     Mental Status: He is alert.  Psychiatric:        Mood and Affect: Mood normal.        Behavior: Behavior normal.      The results of significant diagnostics from this hospitalization (including imaging, microbiology, ancillary and laboratory) are listed below for reference.   Microbiology: No results found for this or any previous visit (from the past 240 hour(s)).   Labs: BNP (last 3 results) Recent Labs    08/15/22 1400  BNP 8.3   Basic Metabolic Panel: Recent Labs  Lab 05/14/23 1206 05/15/23 1052 05/16/23 0410  NA 137 137 133*  K 4.1 4.7 4.8  CL 99 101 99  CO2 23 21* 22  GLUCOSE 236* 302* 329*  BUN 63* 52* 52*  CREATININE 4.01* 2.61* 2.77*  CALCIUM 9.1 9.2 9.2  MG  --   --  2.0   Liver Function Tests: No results for input(s): "AST", "ALT", "ALKPHOS", "BILITOT", "PROT", "ALBUMIN" in the last 168 hours. No results for input(s): "LIPASE", "AMYLASE" in the last 168 hours. No results for input(s): "AMMONIA" in the last 168 hours. CBC: Recent Labs  Lab 05/14/23 1206 05/16/23 0410  WBC 10.2 8.8  NEUTROABS  --  4.5  HGB 12.1* 11.4*  HCT 38.4* 36.8*  MCV 97.0 98.9  PLT 231 207   Cardiac Enzymes: No results for input(s): "CKTOTAL", "CKMB", "CKMBINDEX", "TROPONINI" in the last 168 hours. BNP: Invalid input(s): "POCBNP" CBG: Recent Labs  Lab  05/16/23 0920  GLUCAP 306*   D-Dimer Recent Labs    05/14/23 1616  DDIMER <0.27   Hgb A1c No results for input(s): "HGBA1C" in the last 72 hours. Lipid Profile No results for input(s): "CHOL", "HDL", "LDLCALC", "TRIG", "CHOLHDL", "LDLDIRECT" in the last 72 hours. Thyroid function studies No results for input(s): "TSH", "T4TOTAL", "T3FREE", "THYROIDAB" in the last 72 hours.  Invalid input(s): "FREET3" Anemia work up No results for input(s): "VITAMINB12", "FOLATE", "FERRITIN", "TIBC", "IRON", "RETICCTPCT" in the last 72 hours. Urinalysis    Component Value Date/Time   COLORURINE STRAW (A) 01/29/2023 1930   APPEARANCEUR CLEAR 01/29/2023 1930   LABSPEC 1.014 01/29/2023 1930   PHURINE 5.0 01/29/2023 1930   GLUCOSEU >=500 (A) 01/29/2023 1930   HGBUR MODERATE (A) 01/29/2023 1930   BILIRUBINUR NEGATIVE 01/29/2023 1930   KETONESUR NEGATIVE 01/29/2023 1930   PROTEINUR 30 (A) 01/29/2023 1930   UROBILINOGEN 0.2 05/27/2014 2312   NITRITE NEGATIVE 01/29/2023 1930  LEUKOCYTESUR NEGATIVE 01/29/2023 1930   Sepsis Labs Recent Labs  Lab 05/14/23 1206 05/16/23 0410  WBC 10.2 8.8   Microbiology No results found for this or any previous visit (from the past 240 hour(s)).  Procedures/Studies: CT CHEST WO CONTRAST  Result Date: 05/14/2023 CLINICAL DATA:  Respiratory illness, chest pain and shortness of breath. COVID 10 days ago. EXAM: CT CHEST WITHOUT CONTRAST TECHNIQUE: Multidetector CT imaging of the chest was performed following the standard protocol without IV contrast. RADIATION DOSE REDUCTION: This exam was performed according to the departmental dose-optimization program which includes automated exposure control, adjustment of the mA and/or kV according to patient size and/or use of iterative reconstruction technique. COMPARISON:  CT angiogram chest abdomen and pelvis 11/21/2022. FINDINGS: Cardiovascular: No significant vascular findings. Normal heart size. No pericardial effusion.  There are atherosclerotic calcifications of the coronary arteries and aorta. Mediastinum/Nodes: No enlarged mediastinal or axillary lymph nodes. Thyroid gland, trachea, and esophagus demonstrate no significant findings. Lungs/Pleura: There are mild atelectatic changes in both lower lobes. There are scattered calcified granulomas. There is no focal lung infiltrate, pleural effusion or pneumothorax. Upper Abdomen: There is fatty infiltration of the liver. Musculoskeletal: There is mild bilateral gynecomastia. No acute osseous abnormality. Degenerative changes affect the spine. IMPRESSION: 1. No acute cardiopulmonary process. 2. Fatty infiltration of the liver. Aortic Atherosclerosis (ICD10-I70.0). Electronically Signed   By: Darliss Cheney M.D.   On: 05/14/2023 16:55   DG Chest 2 View  Result Date: 05/14/2023 CLINICAL DATA:  Chest pain, worsening with deep inspiration. EXAM: CHEST - 2 VIEW COMPARISON:  Radiographs 11/21/2022 and 11/12/2022.  CT 11/21/2022. FINDINGS: The heart size and mediastinal contours are normal. The lungs are clear. There is no pleural effusion or pneumothorax. No acute osseous findings are identified. Telemetry leads overlie the chest. There are mild degenerative changes throughout the thoracic spine. IMPRESSION: No active cardiopulmonary process. Electronically Signed   By: Carey Bullocks M.D.   On: 05/14/2023 12:07     Time coordinating discharge: Over 30 minutes    Lewie Chamber, MD  [facility] 05/16/2023, 12:17 PM

## 2023-05-16 NOTE — Inpatient Diabetes Management (Signed)
Inpatient Diabetes Program Recommendations  AACE/ADA: New Consensus Statement on Inpatient Glycemic Control (2015)  Target Ranges:  Prepandial:   less than 140 mg/dL      Peak postprandial:   less than 180 mg/dL (1-2 hours)      Critically ill patients:  140 - 180 mg/dL   Lab Results  Component Value Date   GLUCAP 328 (H) 05/16/2023   HGBA1C 9.5 (H) 11/09/2022    Review of Glycemic Control  Diabetes history: DM2 Outpatient Diabetes medications: U-500 95-120 TID, glipizide 10 mg BID Current orders for Inpatient glycemic control: Semglee 50 units daily, Novolog 0-20 TID with meals + 0-5 HS, 12 units TID  HgbA1C - 9.5% CBGs today: 306, 328  Inpatient Diabetes Program Recommendations:    Pt has been discharged today.   Spoke with pt to verify home meds. Uses Libre 2 at home. States he will call Josephine Igo to get another CGM sent to him as he had to take it off while having testing at hospital.  Will f/u with Endo regarding his HgbA1C.   Thank you. Ailene Ards, RD, LDN, CDCES Inpatient Diabetes Coordinator [phone]

## 2023-06-28 ENCOUNTER — Encounter: Payer: Self-pay | Admitting: Podiatry

## 2023-06-28 ENCOUNTER — Ambulatory Visit (INDEPENDENT_AMBULATORY_CARE_PROVIDER_SITE_OTHER): Payer: 59 | Admitting: Podiatry

## 2023-06-28 DIAGNOSIS — M79675 Pain in left toe(s): Secondary | ICD-10-CM | POA: Diagnosis not present

## 2023-06-28 DIAGNOSIS — E1142 Type 2 diabetes mellitus with diabetic polyneuropathy: Secondary | ICD-10-CM | POA: Diagnosis not present

## 2023-06-28 DIAGNOSIS — B351 Tinea unguium: Secondary | ICD-10-CM | POA: Diagnosis not present

## 2023-06-28 DIAGNOSIS — M79674 Pain in right toe(s): Secondary | ICD-10-CM | POA: Diagnosis not present

## 2023-06-28 NOTE — Progress Notes (Signed)
This patient presents  to my office for at risk foot care.  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  This patient presents for at risk foot care today.  General Appearance  Alert, conversant and in no acute stress.  Vascular  Dorsalis pedis   pulses are absent bilaterally. Posterior tibial pulses are absent due to swelling. Capillary return is within normal limits  bilaterally. Temperature is within normal limits  bilaterally.  Venous stasis  B/L.  Neurologic  Senn-Weinstein monofilament wire test within normal limits  bilaterally. Muscle power within normal limits bilaterally.  Nails Thick disfigured discolored nails with subungual debris  from hallux to fifth toes bilaterally. No evidence of bacterial infection or drainage bilaterally.  Orthopedic  No limitations of motion  feet .  No crepitus or effusions noted.  No bony pathology or digital deformities noted.  Skin  dry skin with no porokeratosis noted bilaterally.  No signs of infections or ulcers noted.     Onychomycosis  Pain in right toes  Pain in left toes  Consent was obtained for treatment procedures.   Mechanical debridement of nails 1-5  bilaterally performed with a nail nipper.  Filed with dremel without incident.    Return office visit    3 months                 Told patient to return for periodic foot care and evaluation due to potential at risk complications.   Gardiner Barefoot DPM

## 2023-07-04 ENCOUNTER — Ambulatory Visit (INDEPENDENT_AMBULATORY_CARE_PROVIDER_SITE_OTHER): Payer: 59 | Admitting: Adult Health

## 2023-07-04 ENCOUNTER — Ambulatory Visit (INDEPENDENT_AMBULATORY_CARE_PROVIDER_SITE_OTHER): Payer: 59

## 2023-07-04 ENCOUNTER — Encounter: Payer: Self-pay | Admitting: Adult Health

## 2023-07-04 VITALS — BP 146/92 | HR 59 | Ht 70.0 in | Wt 353.6 lb

## 2023-07-04 DIAGNOSIS — J40 Bronchitis, not specified as acute or chronic: Secondary | ICD-10-CM | POA: Diagnosis not present

## 2023-07-04 DIAGNOSIS — G4733 Obstructive sleep apnea (adult) (pediatric): Secondary | ICD-10-CM | POA: Diagnosis not present

## 2023-07-04 DIAGNOSIS — R0600 Dyspnea, unspecified: Secondary | ICD-10-CM

## 2023-07-04 DIAGNOSIS — I5032 Chronic diastolic (congestive) heart failure: Secondary | ICD-10-CM | POA: Diagnosis not present

## 2023-07-04 DIAGNOSIS — I2699 Other pulmonary embolism without acute cor pulmonale: Secondary | ICD-10-CM

## 2023-07-04 DIAGNOSIS — J9611 Chronic respiratory failure with hypoxia: Secondary | ICD-10-CM

## 2023-07-04 LAB — BASIC METABOLIC PANEL
BUN: 12 mg/dL (ref 6–23)
CO2: 29 meq/L (ref 19–32)
Calcium: 9.2 mg/dL (ref 8.4–10.5)
Chloride: 106 meq/L (ref 96–112)
Creatinine, Ser: 0.67 mg/dL (ref 0.40–1.50)
GFR: 101.98 mL/min (ref >60.00)
Glucose, Bld: 92 mg/dL (ref 70–99)
Potassium: 3.3 meq/L — ABNORMAL LOW (ref 3.5–5.1)
Sodium: 144 meq/L (ref 135–145)

## 2023-07-04 LAB — BRAIN NATRIURETIC PEPTIDE: Pro B Natriuretic peptide (BNP): 45 pg/mL (ref 0.0–100.0)

## 2023-07-04 NOTE — Progress Notes (Unsigned)
@Patient  ID: [name], male    DOB: [birth year], 62 y.o.   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  Last seen in the office October 2023.  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  Obstructive sleep apnea.  Recently hospitalized in July for atypical chest pain.  Patient had had COVID-19 infection.  Cardiac workup was negative for cardiac source.  Felt chest pain was secondary to musculoskeletal pain from coughing from COVID-19. He remains on Eliquis twice daily.  Endorses compliance.  Denies any known bleeding.  Patient says his weight is up about 15 pounds.  Current weight is at 353 pounds with a BMI at 50.  Says he does have some chronic lower extremity swelling.  He remains on oxygen at 4 L.  Patient says he has very heavy oxygen tanks.  Has a hard time carrying them and so does not use oxygen except when he is in the car and at home.  Cannot use it when he goes out.  Walk test today in the office shows no significant desaturations on room air.  Patient is a never smoker.  Marland Kitchen  He does use Combivent inhaler twice daily.  Has an albuterol inhaler that he uses for rescue.   Patient has a history of sleep apnea.  He says he is very sleepy throughout the day.  He is on multiple sedating medications including trazodone, Zanaflex, oxycodone, gabapentin.  Patient says he was on CPAP previously but stopped using it after it was recalled.  Says he has ongoing snoring and daytime sleepiness.  Feels that his sleep is very fragmented.  He says he wants to get back onto CPAP.  Patient has been off his CPAP for several months.  Allergies  Allergen Reactions   Coconut Fatty Acids Hives    Coconut Flavor [Flavoring Agent] Hives   Ibuprofen Other (See Comments)    Made gastric ulcers worse   Aleve [Naproxen] Other (See Comments)    DUE TO KIDNEYS   Nsaids Other (See Comments)    Stomach ulcers    Vascepa [Icosapent Ethyl] Nausea And Vomiting and Other (See Comments)    headaches, chest pain, similar to sx of a stroke, hypotension    Other Hives and Rash    Nut Allergy    Immunization History  Administered Date(s) Administered   Moderna Sars-Covid-2 Vaccination 12/06/2019, 01/03/2020, 09/08/2020   Pneumococcal Polysaccharide-23 01/14/2016   Tdap 10/12/2015    Past Medical History:  Diagnosis Date   Anemia    Arthritis    Back pain    CAD (coronary artery disease)    a. s/p DES to LAD in 05/2016   Cervical radiculopathy    Chronic diastolic CHF (congestive heart failure) (HCC)    Chronic pain    Deep vein thrombosis (HCC) 01/06/2016   Formatting of this note might be different from the original.  Formatting of this note might be different from the original. Last Assessment & Plan: Management per primary care. Last Assessment & Plan: Management per primary care.  Formatting of this note might be different from the original. Last Assessment & Plan: Management per primary care. Last Assessment & Plan:  Management per primary care.   Depression    DVT (deep venous thrombosis) (HCC)    Hematemesis    Hepatic steatosis    Hyperlipidemia    Hypertension    IBS (irritable bowel syndrome)    Morbid obesity (HCC)    OSA (obstructive sleep apnea)    Pancreatitis    PE (pulmonary thromboembolism) (HCC)    Peripheral neuropathy    PUD (peptic ulcer disease)    Pulmonary embolism (HCC) 07/15/2021   Renal disorder    Stroke [facility])    a. ?details unclear - not seen on imaging when he was admitted in 05/2017 for TIA symptoms which were felt due to cervical radiculopathy.   Thoracic aortic ectasia (HCC)    a. 4.3cm ectatic ascending thoracic aorta by CT 06/2017.    Type 2  diabetes mellitus (HCC)     Tobacco History: Social History   Tobacco Use  Smoking Status Never   Passive exposure: Past  Smokeless Tobacco Never   Counseling given: Not Answered   Outpatient Medications Prior to Visit  Medication Sig Dispense Refill   albuterol (VENTOLIN HFA) 108 (90 Base) MCG/ACT inhaler Inhale 2 puffs into the lungs as needed for wheezing or shortness of breath.     atorvastatin (LIPITOR) 40 MG tablet Take 40 mg by mouth daily.     B Complex-C (B-COMPLEX WITH VITAMIN C) tablet Take 1 tablet by mouth daily. 30 tablet 0   COMBIVENT RESPIMAT 20-100 MCG/ACT AERS respimat Inhale 1 puff into the lungs 4 (four) times daily.     ELIQUIS 5 MG TABS tablet TAKE 1 TABLET BY MOUTH TWICE A DAY 60 tablet 2   fenofibrate (TRICOR) 145 MG tablet TAKE 1 TABLET (145 MG TOTAL) BY MOUTH DAILY. 90 tablet 3   furosemide (LASIX) 40 MG tablet Take 40 mg by mouth 2 (two) times daily.     gabapentin (NEURONTIN) 800 MG tablet Take 800 mg by mouth 3 (three) times daily.     glipiZIDE (GLUCOTROL) 10 MG tablet Take 10 mg by mouth 2 (two) times daily.  4   HUMULIN R U-500 KWIKPEN 500 UNIT/ML kwikpen Inject 95-120 Units into the skin in the morning, at noon, and at bedtime. Per sliding scale 3 times daily with meals     Insulin Pen Needle 31G X 5 MM MISC Use 1 needle daily to inject insulin as prescribed 100 each 2   isosorbide mononitrate (IMDUR) 30 MG 24 hr tablet TAKE ONE TABLET BY MOUTH DAILY 224 tablet 5   lisinopril (ZESTRIL) 20 MG tablet Take 20 mg by mouth daily.     Menthol, Topical Analgesic, (BIOFREEZE EX) Apply 1 application  topically as needed (knee pain).     metoprolol tartrate (LOPRESSOR) 25 MG tablet Take 25 mg by mouth daily.     nitroGLYCERIN (NITROSTAT) 0.4 MG SL tablet Place 1 tablet (0.4 mg total) under the tongue every 5 (five) minutes as needed for chest pain. 100 tablet 1   ondansetron (ZOFRAN) 4 MG tablet Take 4 mg by mouth as needed for nausea/vomiting, vomiting or  nausea.     Oxycodone HCl 20 MG TABS Take 20 mg by mouth every 6 (six) hours as needed (pain).     pantoprazole (PROTONIX) 40 MG tablet Take 40 mg by mouth daily.     sertraline (ZOLOFT) 50 MG tablet Take 50 mg by mouth every morning.     tiZANidine (ZANAFLEX) 4 MG tablet Take 4 mg by mouth at bedtime.  traZODone (DESYREL) 50 MG tablet Take 1 tablet (50 mg total) by mouth at bedtime. 30 tablet 11   Vitamin D, Ergocalciferol, (DRISDOL) 1.25 MG (50000 UT) CAPS capsule Take 50,000 Units by mouth every Monday.     ferrous sulfate 325 (65 FE) MG tablet Take 1 tablet (325 mg total) by mouth daily. 30 tablet 0   potassium chloride (MICRO-K) 10 MEQ CR capsule Take 2 capsules (20 mEq total) by mouth 2 (two) times daily. (Patient not taking: Reported on 07/04/2023) 120 capsule 3   No facility-administered medications prior to visit.     Review of Systems:   Constitutional:   No  weight loss, night sweats,  Fevers, chills,  +fatigue, or  lassitude.  HEENT:   No headaches,  Difficulty swallowing,  Tooth/dental problems, or  Sore throat,                No sneezing, itching, ear ache, nasal congestion, post nasal drip,   CV:  No chest pain,  Orthopnea, PND, +swelling in lower extremities, anasarca, dizziness, palpitations, syncope.   GI  No heartburn, indigestion, abdominal pain, nausea, vomiting, diarrhea, change in bowel habits, loss of appetite, bloody stools.   Resp: No shortness of breath with exertion or at rest.  No excess mucus, no productive cough,  No non-productive cough,  No coughing up of blood.  No change in color of mucus.  No wheezing.  No chest wall deformity  Skin: no rash or lesions.  GU: no dysuria, change in color of urine, no urgency or frequency.  No flank pain, no hematuria   MS:  No joint pain or swelling.  No decreased range of motion.  No back pain.    Physical Exam  BP (!) 146/92   Pulse (!) 59   Ht 5\' 10"  (1.778 m)   Wt (!) 353 lb 9.6 oz (160.4 kg)   SpO2  98%   BMI 50.74 kg/m   GEN: A/Ox3; pleasant , NAD, BMI 50   HEENT:  [place]/AT,  EACs-clear, TMs-wnl, NOSE-clear, THROAT-clear, no lesions, no postnasal drip or exudate noted.   NECK:  Supple w/ fair ROM; no JVD; normal carotid impulses w/o bruits; no thyromegaly or nodules palpated; no lymphadenopathy.    RESP  Clear  P & A; w/o, wheezes/ rales/ or rhonchi. no accessory muscle use, no dullness to percussion  CARD:  RRR, no m/r/g, 1+ peripheral edema, pulses intact, no cyanosis or clubbing.  GI:   Soft & nt; nml bowel sounds; no organomegaly or masses detected.   Musco: Warm bil, no deformities or joint swelling noted.   Neuro: alert, no focal deficits noted.    Skin: Warm, no lesions or rashes    Lab Results:      Imaging: No results found.  Administration History     None          Latest Ref Rng & Units 01/24/2022   10:53 AM  PFT Results  FVC-Pre L 2.84   FVC-Predicted Pre % 69   FVC-Post L 2.89   FVC-Predicted Post % 71   Pre FEV1/FVC % % 79   Post FEV1/FCV % % 81   FEV1-Pre L 2.23   FEV1-Predicted Pre % 70   FEV1-Post L 2.32   DLCO uncorrected ml/min/mmHg 22.28   DLCO UNC% % 79   DLCO corrected ml/min/mmHg 23.82   DLCO COR %Predicted % 85   DLVA Predicted % 128   TLC L 5.37   TLC % Predicted %  76   RV % Predicted % 113     No results found for: "NITRICOXIDE"      Assessment & Plan:   No problem-specific Assessment & Plan notes found for this encounter.     Rubye Oaks, NP 07/04/2023

## 2023-07-04 NOTE — Patient Instructions (Addendum)
Set up for CPAP titration study.   Chest xray today  Labs today  Try Combivent 1 puff Twice daily   Continue on Oxygen with activity and At bedtime   Continue on Lasix  Continue on Eliquis   Follow up with Cardiology as discussed .  Follow up with Dr. Isaiah Serge in 3 months and As needed   Please contact office for sooner follow up if symptoms do not improve or worsen or seek emergency care

## 2023-07-05 LAB — CBC WITH DIFFERENTIAL/PLATELET
Basophils Absolute: 0 10*3/uL (ref 0.0–0.2)
Basos: 1 %
EOS (ABSOLUTE): 0.1 10*3/uL (ref 0.0–0.4)
Eos: 2 %
Hematocrit: 35.2 % — ABNORMAL LOW (ref 37.5–51.0)
Hemoglobin: 11.4 g/dL — ABNORMAL LOW (ref 13.0–17.7)
Immature Grans (Abs): 0 10*3/uL (ref 0.0–0.1)
Immature Granulocytes: 1 %
Lymphocytes Absolute: 1.7 10*3/uL (ref 0.7–3.1)
Lymphs: 26 %
MCH: 30.5 pg (ref 26.6–33.0)
MCHC: 32.4 g/dL (ref 31.5–35.7)
MCV: 94 fL (ref 79–97)
Monocytes Absolute: 0.4 10*3/uL (ref 0.1–0.9)
Monocytes: 7 %
Neutrophils Absolute: 4.2 10*3/uL (ref 1.4–7.0)
Neutrophils: 63 %
Platelets: 212 10*3/uL (ref 150–450)
RBC: 3.74 x10E6/uL — ABNORMAL LOW (ref 4.14–5.80)
RDW: 15 % (ref 11.6–15.4)
WBC: 6.6 10*3/uL (ref 3.4–10.8)

## 2023-07-05 LAB — SPECIMEN STATUS REPORT

## 2023-07-06 DIAGNOSIS — J40 Bronchitis, not specified as acute or chronic: Secondary | ICD-10-CM | POA: Insufficient documentation

## 2023-07-06 DIAGNOSIS — I2699 Other pulmonary embolism without acute cor pulmonale: Secondary | ICD-10-CM | POA: Insufficient documentation

## 2023-07-06 DIAGNOSIS — J9611 Chronic respiratory failure with hypoxia: Secondary | ICD-10-CM | POA: Insufficient documentation

## 2023-07-06 NOTE — Assessment & Plan Note (Signed)
Continue on oxygen to maintain O2 saturations greater than 88 to 90%. 

## 2023-07-06 NOTE — Assessment & Plan Note (Signed)
History of sleep apnea with reported benefit on CPAP.  Unfortunately patient has been off of his CPAP due to a recall.  Will set patient up for a CPAP titration study to further evaluate Patient has significant symptom burden along with multiple sedating medications and morbid obesity with a BMI at 50 he is at risk for hypoventilation. Plan  Patient Instructions  Set up for CPAP titration study.   Chest xray today  Labs today  Try Combivent 1 puff Twice daily   Continue on Oxygen with activity and At bedtime   Continue on Lasix  Continue on Eliquis   Follow up with Cardiology as discussed .  Follow up with Dr. Isaiah Serge in 3 months and As needed   Please contact office for sooner follow up if symptoms do not improve or worsen or seek emergency care

## 2023-07-06 NOTE — Assessment & Plan Note (Addendum)
Suspect patient may have a component of asthmatic bronchitis.  He recently had COVID-19 infection in July with ongoing cough and congestion.  Will check chest x-ray today.  He is using an inhaler for now can continue on Combivent twice daily.  Going forward  may need repeat  PFTs-    Plan  Patient Instructions  Set up for CPAP titration study.   Chest xray today  Labs today  Try Combivent 1 puff Twice daily   Continue on Oxygen with activity and At bedtime   Continue on Lasix  Continue on Eliquis   Follow up with Cardiology as discussed .  Follow up with Dr. Isaiah Serge in 3 months and As needed   Please contact office for sooner follow up if symptoms do not improve or worsen or seek emergency care

## 2023-07-06 NOTE — Progress Notes (Signed)
Spoke with the pt and notified of results/recs per TP. He verbalized understanding. Will start back K. Nothing further needed.

## 2023-07-06 NOTE — Assessment & Plan Note (Signed)
History of recurrent PE on lifelong anticoagulation therapy.  Appears to be tolerating without any apparent issues.  Continue current course.  Patient education given

## 2023-07-06 NOTE — Assessment & Plan Note (Signed)
Congestive heart failure.  Lab work is pending.  Continue follow-up with cardiology.  Continue on current regimen

## 2023-07-17 ENCOUNTER — Other Ambulatory Visit: Payer: Self-pay | Admitting: Student

## 2023-08-15 ENCOUNTER — Emergency Department (HOSPITAL_COMMUNITY)
Admission: EM | Admit: 2023-08-15 | Discharge: 2023-08-15 | Disposition: A | Discharge status: Home / Self Care | Payer: 59 | Attending: Emergency Medicine | Admitting: Emergency Medicine

## 2023-08-15 ENCOUNTER — Other Ambulatory Visit: Payer: Self-pay

## 2023-08-15 ENCOUNTER — Emergency Department (HOSPITAL_COMMUNITY): Payer: 59

## 2023-08-15 ENCOUNTER — Encounter (HOSPITAL_COMMUNITY): Payer: Self-pay | Admitting: Emergency Medicine

## 2023-08-15 DIAGNOSIS — R600 Localized edema: Secondary | ICD-10-CM | POA: Diagnosis not present

## 2023-08-15 DIAGNOSIS — Z794 Long term (current) use of insulin: Secondary | ICD-10-CM | POA: Insufficient documentation

## 2023-08-15 DIAGNOSIS — I2510 Atherosclerotic heart disease of native coronary artery without angina pectoris: Secondary | ICD-10-CM | POA: Diagnosis not present

## 2023-08-15 DIAGNOSIS — I110 Hypertensive heart disease with heart failure: Secondary | ICD-10-CM | POA: Insufficient documentation

## 2023-08-15 DIAGNOSIS — Z8673 Personal history of transient ischemic attack (TIA), and cerebral infarction without residual deficits: Secondary | ICD-10-CM | POA: Insufficient documentation

## 2023-08-15 DIAGNOSIS — R001 Bradycardia, unspecified: Secondary | ICD-10-CM | POA: Diagnosis not present

## 2023-08-15 DIAGNOSIS — Z7901 Long term (current) use of anticoagulants: Secondary | ICD-10-CM | POA: Insufficient documentation

## 2023-08-15 DIAGNOSIS — E119 Type 2 diabetes mellitus without complications: Secondary | ICD-10-CM | POA: Diagnosis not present

## 2023-08-15 DIAGNOSIS — I5032 Chronic diastolic (congestive) heart failure: Secondary | ICD-10-CM | POA: Diagnosis not present

## 2023-08-15 DIAGNOSIS — Z79899 Other long term (current) drug therapy: Secondary | ICD-10-CM | POA: Diagnosis not present

## 2023-08-15 DIAGNOSIS — R079 Chest pain, unspecified: Secondary | ICD-10-CM | POA: Diagnosis present

## 2023-08-15 DIAGNOSIS — Z7984 Long term (current) use of oral hypoglycemic drugs: Secondary | ICD-10-CM | POA: Diagnosis not present

## 2023-08-15 LAB — CBC WITH DIFFERENTIAL/PLATELET
Abs Immature Granulocytes: 0.04 10*3/uL (ref 0.00–0.07)
Basophils Absolute: 0 10*3/uL (ref 0.0–0.1)
Basophils Relative: 0 %
Eosinophils Absolute: 0.1 10*3/uL (ref 0.0–0.5)
Eosinophils Relative: 2 %
HCT: 37.3 % — ABNORMAL LOW (ref 39.0–52.0)
Hemoglobin: 11.9 g/dL — ABNORMAL LOW (ref 13.0–17.0)
Immature Granulocytes: 1 %
Lymphocytes Relative: 28 %
Lymphs Abs: 2.5 10*3/uL (ref 0.7–4.0)
MCH: 31.1 pg (ref 26.0–34.0)
MCHC: 31.9 g/dL (ref 30.0–36.0)
MCV: 97.4 fL (ref 80.0–100.0)
Monocytes Absolute: 0.6 10*3/uL (ref 0.1–1.0)
Monocytes Relative: 7 %
Neutro Abs: 5.5 10*3/uL (ref 1.7–7.7)
Neutrophils Relative %: 62 %
Platelets: 185 10*3/uL (ref 150–400)
RBC: 3.83 MIL/uL — ABNORMAL LOW (ref 4.22–5.81)
RDW: 13.4 % (ref 11.5–15.5)
WBC: 8.8 10*3/uL (ref 4.0–10.5)
nRBC: 0 % (ref 0.0–0.2)

## 2023-08-15 LAB — BASIC METABOLIC PANEL
Anion gap: 14 (ref 5–15)
BUN: 21 mg/dL — ABNORMAL HIGH (ref 6–20)
CO2: 23 mmol/L (ref 22–32)
Calcium: 8.8 mg/dL — ABNORMAL LOW (ref 8.9–10.3)
Chloride: 103 mmol/L (ref 98–111)
Creatinine, Ser: 1.24 mg/dL (ref 0.61–1.24)
GFR, Estimated: >60 mL/min (ref >60)
Glucose, Bld: 132 mg/dL — ABNORMAL HIGH (ref 70–99)
Potassium: 3.7 mmol/L (ref 3.5–5.1)
Sodium: 140 mmol/L (ref 135–145)

## 2023-08-15 LAB — TROPONIN I (HIGH SENSITIVITY)
Troponin I (High Sensitivity): 6 ng/L (ref <18)
Troponin I (High Sensitivity): 7 ng/L (ref <18)

## 2023-08-15 LAB — MAGNESIUM: Magnesium: 1.6 mg/dL — ABNORMAL LOW (ref 1.7–2.4)

## 2023-08-15 LAB — BRAIN NATRIURETIC PEPTIDE: B Natriuretic Peptide: 33.4 pg/mL (ref 0.0–100.0)

## 2023-08-15 MED ORDER — IOHEXOL 350 MG/ML SOLN
80.0000 mL | Freq: Once | INTRAVENOUS | Status: AC | PRN
  Administered 2023-08-15: 80 mL via INTRAVENOUS

## 2023-08-15 MED ORDER — LACTATED RINGERS IV BOLUS: 1000.0000 mL | Freq: Once | INTRAVENOUS | Status: DC

## 2023-08-15 MED ORDER — MAGNESIUM SULFATE 2 GM/50ML IV SOLN
2.0000 g | Freq: Once | INTRAVENOUS | Status: AC
  Administered 2023-08-15: 2 g via INTRAVENOUS
  Filled 2023-08-15: qty 50

## 2023-08-15 MED ORDER — LACTATED RINGERS IV BOLUS
1000.0000 mL | Freq: Once | INTRAVENOUS | Status: AC
  Administered 2023-08-15: 1000 mL via INTRAVENOUS

## 2023-08-15 NOTE — ED Provider Notes (Signed)
[place] EMERGENCY DEPARTMENT AT [facility] Provider Note   CSN: [id number] Arrival date & time: 08/15/23  1027     History  Chief Complaint  Patient presents with   Chest Pain    [name] is a 62 y.o. male.  62 year old male presents today for concern of chest pain, and shortness of breath.  He states he chronically has chest pain which is intermittent however this morning he noticed that the pain also radiated to the right upper extremity which is not typical for his chronic chest pain.  He took 1 nitroglycerin with minimal improvement in pain.  He takes 3 baby aspirin's daily however when he called 911 they instructed him to take for additional baby aspirin's making a total of 7 aspirins today.  He denies diaphoresis, lightheadedness, palpitations associated with chest pain.  He did ambulate from the wheelchair into the room and did have some worsening of his chest pain.  Blood pressures are soft during today's visit which is not typical for him.  Typically he states his systolic runs around 120.  Did not take his medications this morning.  He does mention that he has missed his recent Eliquis dosing.  Has previous history of PE.  Chronically sleeps in a recliner.  Reports chronic peripheral edema.  The history is provided by the patient. No language interpreter was used.       Home Medications Prior to Admission medications   Medication Sig Start Date End Date Taking? Authorizing Provider  albuterol (VENTOLIN HFA) 108 (90 Base) MCG/ACT inhaler Inhale 2 puffs into the lungs as needed for wheezing or shortness of breath.    [provider]  atorvastatin (LIPITOR) 40 MG tablet Take 40 mg by mouth daily.    [provider]  B Complex-C (B-COMPLEX WITH VITAMIN C) tablet Take 1 tablet by mouth daily. 11/16/22   Rolly Salter, MD  COMBIVENT RESPIMAT 20-100 MCG/ACT AERS respimat Inhale 1 puff into the lungs 4 (four) times daily.    [provider]  ELIQUIS 5 MG TABS tablet TAKE 1 TABLET BY MOUTH TWICE A DAY 10/03/22   Benjiman Core, MD  fenofibrate (TRICOR) 145 MG tablet TAKE 1 TABLET (145 MG TOTAL) BY MOUTH DAILY. 05/19/22   Ronney Asters, NP  ferrous sulfate 325 (65 FE) MG tablet Take 1 tablet (325 mg total) by mouth daily. 11/16/22 05/14/23  Rolly Salter, MD  furosemide (LASIX) 40 MG tablet Take 40 mg by mouth 2 (two) times daily.    [provider]  gabapentin (NEURONTIN) 800 MG tablet Take 800 mg by mouth 3 (three) times daily.    [provider]  glipiZIDE (GLUCOTROL) 10 MG tablet Take 10 mg by mouth 2 (two) times daily. 05/30/17   [provider]  HUMULIN R U-500 KWIKPEN 500 UNIT/ML kwikpen Inject 95-120 Units into the skin in the morning, at noon, and at bedtime. Per sliding scale 3 times daily with meals 06/04/20   [provider]  Insulin Pen Needle 31G X 5 MM MISC Use 1 needle daily to inject insulin as prescribed 06/18/17   Vassie Loll, MD  isosorbide mononitrate (IMDUR) 30 MG 24 hr tablet TAKE ONE TABLET BY MOUTH DAILY 01/02/23   Hilty, Lisette [name], MD  lisinopril (ZESTRIL) 20 MG tablet Take 20 mg by mouth daily. 05/02/23   [provider]  Menthol, Topical Analgesic, (BIOFREEZE EX) Apply 1 application  topically as needed (knee pain).  [provider]  metoprolol tartrate (LOPRESSOR) 25 MG tablet Take 25 mg by mouth daily. 05/05/23   [provider]  nitroGLYCERIN (NITROSTAT) 0.4 MG SL tablet Place 1 tablet (0.4 mg total) under the tongue every 5 (five) minutes as needed for chest pain. 02/06/19   Chilton Si, MD  ondansetron (ZOFRAN) 4 MG tablet Take 4 mg by mouth as needed for nausea/vomiting, vomiting or nausea. 04/21/20   [provider]  Oxycodone HCl 20 MG TABS Take 20 mg by mouth every 6 (six) hours as needed (pain).    [provider]  pantoprazole (PROTONIX) 40 MG tablet Take 40 mg by mouth daily. 05/26/22   [provider]  potassium chloride (MICRO-K) 10 MEQ CR capsule Take 2 capsules (20 mEq total) by mouth 2 (two) times daily. Patient not taking: Reported on 07/04/2023 07/21/22   Lewayne Bunting, MD  sertraline (ZOLOFT) 50 MG tablet Take 50 mg by mouth every morning. 12/17/20   [provider]  tiZANidine (ZANAFLEX) 4 MG tablet Take 4 mg by mouth at bedtime. 03/31/20   [provider]  traZODone (DESYREL) 50 MG tablet Take 1 tablet (50 mg total) by mouth at bedtime. 12/15/16   Servando Snare, MD  Vitamin D, Ergocalciferol, (DRISDOL) 1.25 MG (50000 UT) CAPS capsule Take 50,000 Units by mouth every Monday. 08/15/19   [provider]      Allergies    Coconut fatty acid, Coconut flavor [flavoring agent], Ibuprofen, Aleve [naproxen], Nsaids, Vascepa [icosapent ethyl], and Other    Review of Systems   Review of Systems  Constitutional:  Negative for diaphoresis and fever.  Respiratory:  Positive for shortness of breath.   Cardiovascular:  Positive for chest pain and leg swelling. Negative for palpitations.  Gastrointestinal:  Negative for abdominal pain.  Neurological:  Negative for light-headedness.  All other systems reviewed and are negative.   Physical Exam Updated Vital Signs BP 118/64   Pulse (!) 51   Temp (!) 97.5 F (36.4 C)   Resp 19   Ht 5\' 10"  (1.778 m)   Wt (!) 154.2 kg   SpO2 99%   BMI 48.78 kg/m  Physical Exam Vitals and nursing note reviewed.  Constitutional:      General: He is not in acute distress.    Appearance: Normal appearance. He is not ill-appearing.  HENT:     Head: Normocephalic and atraumatic.     Nose: Nose normal.  Eyes:     General: No scleral icterus.    Extraocular Movements: Extraocular movements intact.     Conjunctiva/sclera: Conjunctivae normal.  Cardiovascular:     Rate and Rhythm: Normal rate and regular rhythm.     Heart sounds: Normal heart sounds.  Pulmonary:     Effort: Pulmonary effort is normal. No  respiratory distress.     Breath sounds: Normal breath sounds. No wheezing or rales.  Abdominal:     General: There is no distension.     Palpations: Abdomen is soft.     Tenderness: There is no abdominal tenderness.  Musculoskeletal:        General: Normal range of motion.     Cervical back: Normal range of motion.     Right lower leg: Edema present.     Left lower leg: Edema present.  Skin:    General: Skin is warm and dry.  Neurological:     General: No focal deficit present.     Mental Status: He is alert.  Mental status is at baseline.     ED Results / Procedures / Treatments   Labs (all labs ordered are listed, but only abnormal results are displayed) Labs Reviewed  BASIC METABOLIC PANEL - Abnormal; Notable for the following components:      Result Value   Glucose, Bld 132 (*)    BUN 21 (*)    Calcium 8.8 (*)    All other components within normal limits  CBC WITH DIFFERENTIAL/PLATELET - Abnormal; Notable for the following components:   RBC 3.83 (*)    Hemoglobin 11.9 (*)    HCT 37.3 (*)    All other components within normal limits  MAGNESIUM - Abnormal; Notable for the following components:   Magnesium 1.6 (*)    All other components within normal limits  BRAIN NATRIURETIC PEPTIDE  TROPONIN I (HIGH SENSITIVITY)  TROPONIN I (HIGH SENSITIVITY)    EKG None  Radiology No results found.  Procedures Procedures    Medications Ordered in ED Medications  lactated ringers bolus 1,000 mL (1,000 mLs Intravenous New Bag/Given 08/15/23 1055)    ED Course/ Medical Decision Making/ A&P                                 Medical Decision Making Amount and/or Complexity of Data Reviewed Labs: ordered. Radiology: ordered.  Risk Prescription drug management.   Medical Decision Making / ED Course   This patient presents to the ED for concern of chest pain, shortness of breath, this involves an extensive number of treatment options, and is a complaint that carries  with it a high risk of complications and morbidity.  The differential diagnosis includes ACS, PE, pneumonia, MSK etiology, CHF exacerbation, dissection  MDM: 62 year old male presents today for concern of chest pain shortness of breath.  He states chest pain is chronic and is not of concern with the exception of lesion to the right arm.  Minimally improved with nitroglycerin.  Overall hemodynamically stable but did have soft blood pressures throughout the department that resolved with fluid bolus.  Missed few doses on his Eliquis.  Will obtain PE study.  CBC without leukocytosis.  No significant anemia.  BMP without renal insufficiency or without significant electrolyte derangement.  Magnesium 1.6.  BNP within normal.  Troponin negative x 2.  Chest x-ray without acute cardiopulmonary process but does show concern for pneumonia.  However he is without cough and afebrile.  Pain low suspicion for pneumonia however will further evaluate with CT.  Patient pending CT imaging at the end of my shift.  Signed out to oncoming provider to follow-up on this.  If this is negative patient can be discharged, however if he is lightheaded or symptomatic or is not significantly better he could be admitted for observation.   Lab Tests: -I ordered, reviewed, and interpreted labs.   The pertinent results include:   Labs Reviewed  BASIC METABOLIC PANEL - Abnormal; Notable for the following components:      Result Value   Glucose, Bld 132 (*)    BUN 21 (*)    Calcium 8.8 (*)    All other components within normal limits  CBC WITH DIFFERENTIAL/PLATELET - Abnormal; Notable for the following components:   RBC 3.83 (*)    Hemoglobin 11.9 (*)    HCT 37.3 (*)    All other components within normal limits  MAGNESIUM - Abnormal; Notable for the following components:   Magnesium 1.6 (*)  All other components within normal limits  BRAIN NATRIURETIC PEPTIDE  TROPONIN I (HIGH SENSITIVITY)  TROPONIN I (HIGH SENSITIVITY)       EKG  EKG Interpretation Date/Time:  Tuesday August 15 2023 11:10:29 EDT Ventricular Rate:  53 PR Interval:  195 QRS Duration:  90 QT Interval:  466 QTC Calculation: 438 R Axis:   44  Text Interpretation: Sinus rhythm Low voltage, precordial leads Anterior injury pattern Abnormal ECG Confirmed by Gerhard Munch [phone]) on 08/15/2023 2:30:12 PM         Imaging Studies ordered: I ordered imaging studies including chest x-ray.  CT angio PE study that has been ordered but has not resulted prior to discharge. I independently visualized and interpreted imaging. I agree with the radiologist interpretation   Medicines ordered and prescription drug management: Meds ordered this encounter  Medications   DISCONTD: lactated ringers bolus 1,000 mL   lactated ringers bolus 1,000 mL   iohexol (OMNIPAQUE) 350 MG/ML injection 80 mL    -I have reviewed the patients home medicines and have made adjustments as needed   Reevaluation: After the interventions noted above, I reevaluated the patient and found that they have :improved  Co morbidities that complicate the patient evaluation  Past Medical History:  Diagnosis Date   Anemia    Arthritis    Back pain    CAD (coronary artery disease)    a. s/p DES to LAD in 05/2016   Cervical radiculopathy    Chronic diastolic CHF (congestive heart failure) (HCC)    Chronic pain    Deep vein thrombosis (HCC) 01/06/2016   Formatting of this note might be different from the original.  Formatting of this note might be different from the original. Last Assessment & Plan: Management per primary care. Last Assessment & Plan: Management per primary care.  Formatting of this note might be different from the original. Last Assessment & Plan: Management per primary care. Last Assessment & Plan: Management per primary care.   Depression    DVT (deep venous thrombosis) (HCC)    Hematemesis    Hepatic steatosis    Hyperlipidemia    Hypertension     IBS (irritable bowel syndrome)    Morbid obesity (HCC)    OSA (obstructive sleep apnea)    Pancreatitis    PE (pulmonary thromboembolism) (HCC)    Peripheral neuropathy    PUD (peptic ulcer disease)    Pulmonary embolism (HCC) 07/15/2021   Renal disorder    Stroke [facility])    a. ?details unclear - not seen on imaging when he was admitted in 05/2017 for TIA symptoms which were felt due to cervical radiculopathy.   Thoracic aortic ectasia (HCC)    a. 4.3cm ectatic ascending thoracic aorta by CT 06/2017.    Type 2 diabetes mellitus (HCC)       Dispostion: Patient signed out to oncoming provider to follow-up on CT imaging.  Final Clinical Impression(s) / ED Diagnoses Final diagnoses:  None    Rx / DC Orders ED Discharge Orders     None         Marita Kansas, PA-C 08/15/23 1521    Gerhard Munch, MD 08/16/23 [phone]

## 2023-08-15 NOTE — Discharge Instructions (Addendum)
Please make sure you stay compliant with your medications and take as prescribed.  Please make sure you follow-up with your cardiologist for your chest pain as well as your lower heart rates. Please make sure to keep up with your blood pressure logs. If you have any concerns of any worsening symptoms, please return to your nearest emergency department for evaluation.  Contact a doctor if: Your chest pain does not go away. You feel depressed. You have a fever. Get help right away if: Your chest pain is worse. You have a cough that gets worse, or you cough up blood. You have very bad (severe) pain in your belly (abdomen). You pass out (faint). You have either of these for no clear reason: Sudden chest discomfort. Sudden discomfort in your arms, back, neck, or jaw. You have shortness of breath at any time. You suddenly start to sweat, or your skin gets clammy. You feel sick to your stomach (nauseous). You throw up (vomit). You suddenly feel lightheaded or dizzy. You feel very weak or tired. Your heart starts to beat fast, or it feels like it is skipping beats. These symptoms may be an emergency. Do not wait to see if the symptoms will go away. Get medical help right away. Call your local emergency services (911 in the [place] Do not drive yourself to the hospital.

## 2023-08-15 NOTE — ED Triage Notes (Signed)
Pt BIB [facility] with reports of CP and R arm pain that started at 0100 this morning. Pt took 1 nitroglycerin with minimal relief of chest pain. Pt took 7 81mg  ASA PTA.

## 2023-08-15 NOTE — ED Provider Notes (Signed)
Physical Exam  BP (!) 152/80   Pulse (!) 50   Temp 97.6 F (36.4 C)   Resp 17   Ht 5\' 10"  (1.778 m)   Wt (!) 154.2 kg   SpO2 100%   BMI 48.78 kg/m   Physical Exam Vitals and nursing note reviewed.  Constitutional:      General: He is not in acute distress.    Appearance: He is not toxic-appearing.  Cardiovascular:     Rate and Rhythm: Bradycardia present.  Pulmonary:     Breath sounds: Normal breath sounds.     Comments: On his at home oxygen Neurological:     Mental Status: He is alert.     Procedures  Procedures  ED Course / MDM    Medical Decision Making Amount and/or Complexity of Data Reviewed Labs: ordered. Radiology: ordered.  Risk Prescription drug management.   Accepted handoff at shift change from [facility] [place], [place]. Please see prior provider note for more detail.   Briefly: Patient is 62 y.o. M presents to the ER for evaluation of chest pain and SOB.   DDX: concern for PE vs PNA  Plan: Follow up on CT imaging.  Patient is sitting on chair in no acute distress watching TV. CT imaging still pending.   CT angio shows: Breathing motion. No segmental or larger pulmonary embolism. There is some enlargement of the main pulmonary artery. Please correlate for pulmonary artery hypertension. Heart enlarged. Slightly patulous esophagus with some luminal debris. Basilar atelectasis or scarring. Evidence of old granulomatous disease. Mild areas of bronchial wall thickening  The patient reports that his chest pain he has had chronically for a while.  He does not appear in any acute distress reports he is feeling better and is anticipating going home.  Vital signs discharge and bradycardia I see patient has been in the 50s before but is now in the 60s again.,  Patient's been sitting, relaxing.  Is not symptomatic from the bradycardia.  I will have him follow-up with his cardiologist.  He is already on a half dose of metoprolol.  Discussed for him to call to manage his  medications.  He reports that he takes his daily blood pressures at home and reports that they have been in the 120s to 110s.  We discussed return precautions and red flag symptoms.  Patient verbalizes understanding agrees the plan.  Patient stable for discharge.  CT Angio Chest PE W and/or Wo Contrast  Result Date: 08/15/2023 CLINICAL DATA:  Chest pain radiating to the right arm since 1 a.m. Shortness of breath. EXAM: CT ANGIOGRAPHY CHEST WITH CONTRAST TECHNIQUE: Multidetector CT imaging of the chest was performed using the standard protocol during bolus administration of intravenous contrast. Multiplanar CT image reconstructions and MIPs were obtained to evaluate the vascular anatomy. RADIATION DOSE REDUCTION: This exam was performed according to the departmental dose-optimization program which includes automated exposure control, adjustment of the mA and/or kV according to patient size and/or use of iterative reconstruction technique. CONTRAST:  80mL OMNIPAQUE IOHEXOL 350 MG/ML SOLN COMPARISON:  X-ray 08/15/2023 and earlier. CT 05/14/2023. CT angiogram 11/21/2022 FINDINGS: Cardiovascular: There is diffuse breathing motion throughout the examination limiting evaluation of emboli including nondiagnostic for small and peripheral emboli. No segmental or larger pulmonary embolism identified. There is some enlargement of the main pulmonary artery. Please correlate for pulmonary artery hypertension. Heart is slightly enlarged. Coronary artery calcifications are seen. No significant pericardial effusion. The thoracic aorta overall has a normal course and caliber with  mild atherosclerotic plaque. Mediastinum/Nodes: Normal caliber thoracic esophagus. Preserved thyroid gland. No specific abnormal lymph node enlargement identified in the axillary regions, hilum or mediastinum. Lungs/Pleura: Breathing motion seen. There is some linear opacity at the bases. Scar or atelectasis. There are some scattered calcified nodules  identified consistent with old granulomatous disease. No pneumothorax, effusion or edema. No frank consolidation. There are some areas of bronchial wall thickening along the left lower lobe as well. Upper Abdomen: Along the upper abdomen the adrenal glands are preserved. Mild liver enlargement as seen previously. Few prominent upper abdominal nodes but not pathologic by size criteria. Musculoskeletal: Moderate degenerative changes of the spine with diffuse bridging syndesmophytes and osteophytes. Review of the MIP images confirms the above findings. IMPRESSION: Breathing motion. No segmental or larger pulmonary embolism. There is some enlargement of the main pulmonary artery. Please correlate for pulmonary artery hypertension. Heart enlarged. Slightly patulous esophagus with some luminal debris. Basilar atelectasis or scarring. Evidence of old granulomatous disease. Mild areas of bronchial wall thickening Aortic Atherosclerosis (ICD10-I70.0). Electronically Signed   By: Karen Kays M.D.   On: 08/15/2023 17:23   DG Chest 2 View  Result Date: 08/15/2023 CLINICAL DATA:  Chest pain. EXAM: CHEST - 2 VIEW COMPARISON:  Chest radiograph dated July 04, 2023. FINDINGS: The heart size is enlarged, similar to the prior exam. Hazy opacity at the medial right lung base. No pneumothorax or pleural effusion. No acute osseous abnormality. Degenerative changes of the visualized spine. IMPRESSION: Hazy medial right lung base opacity may relate to atelectasis or possible infectious/inflammatory etiology. Electronically Signed   By: Hart Robinsons M.D.   On: 08/15/2023 13:08            Achille Rich, PA-C 08/15/23 1831    Benjiman Core, MD 08/15/23 [phone]

## 2023-09-14 ENCOUNTER — Emergency Department (HOSPITAL_COMMUNITY): Payer: Medicaid Other

## 2023-09-14 ENCOUNTER — Other Ambulatory Visit: Payer: Self-pay

## 2023-09-14 ENCOUNTER — Encounter (HOSPITAL_COMMUNITY): Payer: Self-pay

## 2023-09-14 ENCOUNTER — Emergency Department (HOSPITAL_COMMUNITY)
Admission: EM | Admit: 2023-09-14 | Discharge: 2023-09-14 | Disposition: A | Discharge status: Home / Self Care | Payer: Medicaid Other | Attending: Emergency Medicine | Admitting: Emergency Medicine

## 2023-09-14 DIAGNOSIS — Z79899 Other long term (current) drug therapy: Secondary | ICD-10-CM | POA: Insufficient documentation

## 2023-09-14 DIAGNOSIS — Z7984 Long term (current) use of oral hypoglycemic drugs: Secondary | ICD-10-CM | POA: Insufficient documentation

## 2023-09-14 DIAGNOSIS — I110 Hypertensive heart disease with heart failure: Secondary | ICD-10-CM | POA: Diagnosis not present

## 2023-09-14 DIAGNOSIS — M5412 Radiculopathy, cervical region: Secondary | ICD-10-CM | POA: Diagnosis not present

## 2023-09-14 DIAGNOSIS — M79601 Pain in right arm: Secondary | ICD-10-CM | POA: Insufficient documentation

## 2023-09-14 DIAGNOSIS — I509 Heart failure, unspecified: Secondary | ICD-10-CM | POA: Insufficient documentation

## 2023-09-14 DIAGNOSIS — M4800 Spinal stenosis, site unspecified: Secondary | ICD-10-CM

## 2023-09-14 DIAGNOSIS — M48061 Spinal stenosis, lumbar region without neurogenic claudication: Secondary | ICD-10-CM | POA: Diagnosis not present

## 2023-09-14 DIAGNOSIS — E119 Type 2 diabetes mellitus without complications: Secondary | ICD-10-CM | POA: Insufficient documentation

## 2023-09-14 LAB — CBC
HCT: 37.3 % — ABNORMAL LOW (ref 39.0–52.0)
Hemoglobin: 11.8 g/dL — ABNORMAL LOW (ref 13.0–17.0)
MCH: 29.9 pg (ref 26.0–34.0)
MCHC: 31.6 g/dL (ref 30.0–36.0)
MCV: 94.4 fL (ref 80.0–100.0)
Platelets: 179 10*3/uL (ref 150–400)
RBC: 3.95 MIL/uL — ABNORMAL LOW (ref 4.22–5.81)
RDW: 13.2 % (ref 11.5–15.5)
WBC: 7.5 10*3/uL (ref 4.0–10.5)
nRBC: 0 % (ref 0.0–0.2)

## 2023-09-14 LAB — BASIC METABOLIC PANEL
Anion gap: 8 (ref 5–15)
BUN: 11 mg/dL (ref 6–20)
CO2: 29 mmol/L (ref 22–32)
Calcium: 8.7 mg/dL — ABNORMAL LOW (ref 8.9–10.3)
Chloride: 103 mmol/L (ref 98–111)
Creatinine, Ser: 1.18 mg/dL (ref 0.61–1.24)
GFR, Estimated: >60 mL/min (ref >60)
Glucose, Bld: 106 mg/dL — ABNORMAL HIGH (ref 70–99)
Potassium: 2.9 mmol/L — ABNORMAL LOW (ref 3.5–5.1)
Sodium: 140 mmol/L (ref 135–145)

## 2023-09-14 LAB — TROPONIN I (HIGH SENSITIVITY)
Troponin I (High Sensitivity): 14 ng/L (ref <18)
Troponin I (High Sensitivity): 10 ng/L (ref <18)

## 2023-09-14 LAB — BRAIN NATRIURETIC PEPTIDE: B Natriuretic Peptide: 42.3 pg/mL (ref 0.0–100.0)

## 2023-09-14 MED ORDER — PREDNISONE 5 MG PO TABS
50.0000 mg | ORAL_TABLET | Freq: Once | ORAL | Status: AC
  Administered 2023-09-14: 50 mg via ORAL
  Filled 2023-09-14: qty 2

## 2023-09-14 MED ORDER — PREDNISONE 20 MG PO TABS: 20.0000 mg | ORAL_TABLET | Freq: Every day | ORAL | 0 refills | Status: AC

## 2023-09-14 MED ORDER — SODIUM CHLORIDE 0.9 % IV BOLUS
500.0000 mL | Freq: Once | INTRAVENOUS | Status: AC
  Administered 2023-09-14: 500 mL via INTRAVENOUS
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
## 2023-09-14 NOTE — ED Notes (Signed)
Pt does not wear oxygen when he leaves the home, due to not being able to get a concentrator. Pt going home without oxygen since he does not always wear it, with family.

## 2023-09-14 NOTE — ED Notes (Signed)
Doppler manual BP SBP 64. MD Wallace Cullens notified.

## 2023-09-14 NOTE — ED Notes (Signed)
Pt unable to stay awake during conversation, MD notified.

## 2023-09-14 NOTE — ED Notes (Signed)
Pt falling asleep mid conversation, pt reports " This is not normal for me once I am awake I can stay awake all day, but I feel extremely tired right now."

## 2023-09-14 NOTE — ED Triage Notes (Addendum)
Pt B[facility] from home with right arm numbness and tingling that has been going on for months. Pain has increased today with a hot sensation that worsened tonight and woke him from his sleep. Pt reports chronic CP, mostly here due to the right arm.   Oxygen 4 LPM  baseline

## 2023-09-27 ENCOUNTER — Encounter: Payer: Self-pay | Admitting: Podiatry

## 2023-09-27 ENCOUNTER — Ambulatory Visit (INDEPENDENT_AMBULATORY_CARE_PROVIDER_SITE_OTHER): Payer: Medicaid Other | Admitting: Podiatry

## 2023-09-27 DIAGNOSIS — B351 Tinea unguium: Secondary | ICD-10-CM

## 2023-09-27 DIAGNOSIS — M79675 Pain in left toe(s): Secondary | ICD-10-CM | POA: Diagnosis not present

## 2023-09-27 DIAGNOSIS — E1142 Type 2 diabetes mellitus with diabetic polyneuropathy: Secondary | ICD-10-CM | POA: Diagnosis not present

## 2023-09-27 DIAGNOSIS — M79674 Pain in right toe(s): Secondary | ICD-10-CM | POA: Diagnosis not present

## 2023-09-27 NOTE — Progress Notes (Signed)
This patient presents  to my office for at risk foot care.  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  This patient presents for at risk foot care today.  General Appearance  Alert, conversant and in no acute stress.  Vascular  Dorsalis pedis   pulses are absent bilaterally. Posterior tibial pulses are absent due to swelling. Capillary return is within normal limits  bilaterally. Temperature is within normal limits  bilaterally.  Venous stasis  B/L.  Neurologic  Senn-Weinstein monofilament wire test within normal limits  bilaterally. Muscle power within normal limits bilaterally.  Nails Thick disfigured discolored nails with subungual debris  from hallux to fifth toes bilaterally. No evidence of bacterial infection or drainage bilaterally.  Orthopedic  No limitations of motion  feet .  No crepitus or effusions noted.  No bony pathology or digital deformities noted.  Skin  dry skin with no porokeratosis noted bilaterally.  No signs of infections or ulcers noted.     Onychomycosis  Pain in right toes  Pain in left toes  Consent was obtained for treatment procedures.   Mechanical debridement of nails 1-5  bilaterally performed with a nail nipper.  Filed with dremel without incident.    Return office visit    3 months                 Told patient to return for periodic foot care and evaluation due to potential at risk complications.   Gardiner Barefoot DPM

## 2023-10-04 ENCOUNTER — Encounter: Payer: Self-pay | Admitting: Pulmonary Disease

## 2023-10-04 ENCOUNTER — Ambulatory Visit: Payer: Medicaid Other | Admitting: Pulmonary Disease

## 2023-10-04 VITALS — BP 162/86 | HR 86 | Temp 98.4°F | Ht 70.0 in | Wt 354.0 lb

## 2023-10-04 DIAGNOSIS — Z86711 Personal history of pulmonary embolism: Secondary | ICD-10-CM

## 2023-10-04 DIAGNOSIS — G4730 Sleep apnea, unspecified: Secondary | ICD-10-CM | POA: Diagnosis not present

## 2023-10-04 DIAGNOSIS — I2699 Other pulmonary embolism without acute cor pulmonale: Secondary | ICD-10-CM

## 2023-10-04 DIAGNOSIS — G4733 Obstructive sleep apnea (adult) (pediatric): Secondary | ICD-10-CM

## 2023-10-04 DIAGNOSIS — R0600 Dyspnea, unspecified: Secondary | ICD-10-CM

## 2023-10-04 NOTE — Patient Instructions (Signed)
VISIT SUMMARY:  During today's visit, we discussed your ongoing breathing difficulties and reviewed your current treatments for your history of pulmonary embolisms and respiratory issues. We also talked about your upcoming sleep study.  YOUR PLAN:  -RECURRENT PULMONARY EMBOLISM: A pulmonary embolism is a blockage in one of the pulmonary arteries in your lungs, often caused by blood clots that travel to the lungs from the legs or other parts of the body. You have had multiple episodes, with the most recent one occurring last year. You should continue your current anticoagulation (blood thinner) therapy to prevent further clots.  -CHRONIC RESPIRATORY INSUFFICIENCY: Chronic respiratory insufficiency means your lungs are not able to get enough oxygen into your blood. You experience shortness of breath, especially when walking or in certain weather conditions. You should continue using your home oxygen therapy as prescribed.  -SLEEP APNEA: Sleep apnea is a condition where your breathing repeatedly stops and starts during sleep. You have an in-lab sleep study scheduled for October 21, 2023. We will review the results once they are available and consider restarting CPAP therapy if needed.  INSTRUCTIONS:  Please follow up in 6 months or sooner if the sleep study results indicate the need for intervention.

## 2023-10-04 NOTE — Progress Notes (Signed)
[name]    [id number]    10-23-1961  Primary Care Physician:Beamer, Benetta Spar, MD  Referring Physician: Hilton Cork, FNP [phone] [address] [place],  [place] [address]  Chief complaint: Follow-up for dyspnea  HPI: 62 y.o.  with history of COVID-19, PE, OSA, morbid obesity, coronary artery disease, DVT/PE complains of chronic dyspnea on exertion for the past 5 months.  He was hospitalized in September 2022 with COVID-19 infection.  Also found to have acute pulmonary embolism.  Started on heparin drip and transition to Eliquis.  Treated with remdesivir and steroids.  He had an admission shortly thereafter for acute GI bleed in setting of anticoagulation.  Underwent EGD and colonoscopy with no significant bleeding.  He continues on Eliquis Also has history of coronary artery disease and is undergoing stress test and due for follow-up with cardiology  He has history of OSA with sleep study done at [facility].  He has not been on CPAP for many years but is noncompliant now  He was taken off anticoagulation earlier this year as there is concern for GI bleed but had to be hospitalized on 03/22/2022 with subsegmental PE.  He was placed back on IV heparin which was transitioned to oral anticoagulation.  Also noted to have bright red blood per rectum from internal hemorrhoids.  GI was consulted who recommended no further work up at this time as he had EGD and colonoscopy in 06/2021 showing diverticulosis and internal hemorrhoids.  Pets: No pets Occupation: Retired Building control surveyor Exposures: No mold, hot tub, Financial controller.  No feather pillows or comforter Smoking history: Never smoker Travel history: No significant travel history Relevant family history: No family history of lung disease  Interim history: Discussed the use of AI scribe software for clinical note transcription with the patient, who gave verbal consent to proceed.  The patient, with a history of multiple  pulmonary embolisms and hemorrhoidal bleeding, presents with intermittent breathing difficulty. The severity of his dyspnea varies day to day, and is exacerbated by walking and certain weather conditions. He is currently on blood thinners due to his history of recurrent pulmonary embolisms, the most recent of which occurred last year. He denies any recent episodes of hemorrhoidal bleeding.  The patient also reports that he is on home oxygen therapy, which he uses almost all the time. He is scheduled for a sleep study at the end of the month, as ordered by Babette Relic,  nurse practitioner he saw in September.   Outpatient Encounter Medications as of 10/04/2023  Medication Sig   albuterol (VENTOLIN HFA) 108 (90 Base) MCG/ACT inhaler Inhale 2 puffs into the lungs as needed for wheezing or shortness of breath.   atorvastatin (LIPITOR) 40 MG tablet Take 40 mg by mouth daily.   atorvastatin (LIPITOR) 80 MG tablet Take 80 mg by mouth daily.   B Complex-C (B-COMPLEX WITH VITAMIN C) tablet Take 1 tablet by mouth daily.   carvedilol (COREG) 12.5 MG tablet Take 12.5 mg by mouth 2 (two) times daily.   COMBIVENT RESPIMAT 20-100 MCG/ACT AERS respimat Inhale 1 puff into the lungs 4 (four) times daily.   ELIQUIS 5 MG TABS tablet TAKE 1 TABLET BY MOUTH TWICE A DAY   fenofibrate (TRICOR) 145 MG tablet TAKE 1 TABLET (145 MG TOTAL) BY MOUTH DAILY.   furosemide (LASIX) 40 MG tablet Take 40 mg by mouth 2 (two) times daily.   gabapentin (NEURONTIN) 800 MG tablet Take 800 mg  by mouth 3 (three) times daily.   glipiZIDE (GLUCOTROL) 10 MG tablet Take 10 mg by mouth 2 (two) times daily.   HUMULIN R U-500 KWIKPEN 500 UNIT/ML kwikpen Inject 95-120 Units into the skin in the morning, at noon, and at bedtime. Per sliding scale 3 times daily with meals   Insulin Pen Needle 31G X 5 MM MISC Use 1 needle daily to inject insulin as prescribed   isosorbide dinitrate (ISORDIL) 30 MG tablet Take 30 mg by mouth 2 (two) times daily.    isosorbide mononitrate (IMDUR) 30 MG 24 hr tablet TAKE ONE TABLET BY MOUTH DAILY   LANTUS SOLOSTAR 100 UNIT/ML Solostar Pen Inject into the skin.   lisinopril (ZESTRIL) 20 MG tablet Take 20 mg by mouth daily.   Menthol, Topical Analgesic, (BIOFREEZE EX) Apply 1 application  topically as needed (knee pain).   metoprolol tartrate (LOPRESSOR) 25 MG tablet Take 25 mg by mouth daily.   nitroGLYCERIN (NITROSTAT) 0.4 MG SL tablet Place 1 tablet (0.4 mg total) under the tongue every 5 (five) minutes as needed for chest pain.   ondansetron (ZOFRAN) 4 MG tablet Take 4 mg by mouth as needed for nausea/vomiting, vomiting or nausea.   Oxycodone HCl 20 MG TABS Take 20 mg by mouth every 6 (six) hours as needed (pain).   pantoprazole (PROTONIX) 40 MG tablet Take 40 mg by mouth daily.   potassium chloride (KLOR-CON) 20 MEQ packet Take 20 mEq by mouth daily.   potassium chloride (MICRO-K) 10 MEQ CR capsule Take 2 capsules (20 mEq total) by mouth 2 (two) times daily.   sertraline (ZOLOFT) 50 MG tablet Take 50 mg by mouth every morning.   tiZANidine (ZANAFLEX) 4 MG tablet Take 4 mg by mouth at bedtime.   traZODone (DESYREL) 50 MG tablet Take 1 tablet (50 mg total) by mouth at bedtime.   triamcinolone cream (KENALOG) 0.1 % Apply 1 Application topically 3 (three) times daily.   Vitamin D, Ergocalciferol, (DRISDOL) 1.25 MG (50000 UT) CAPS capsule Take 50,000 Units by mouth every Monday.   ferrous sulfate 325 (65 FE) MG tablet Take 1 tablet (325 mg total) by mouth daily.   No facility-administered encounter medications on file as of 10/04/2023.   Physical Exam: Blood pressure (!) 162/86, pulse 86, temperature 98.4 F (36.9 C), temperature source Oral, height 5\' 10"  (1.778 m), weight (!) 354 lb (160.6 kg), SpO2 97%. Gen:      No acute distress HEENT:  EOMI, sclera anicteric Neck:     No masses; no thyromegaly Lungs:    Clear to auscultation bilaterally; normal respiratory effort CV:         Regular rate and  rhythm; no murmurs Abd:      + bowel sounds; soft, non-tender; no palpable masses, no distension Ext:    No edema; adequate peripheral perfusion Skin:      Warm and dry; no rash Neuro: alert and oriented x 3 Psych: normal mood and affect   Data Reviewed: Imaging: CT 06/25/2021-pulm embolism with moderate clot burden, RV/LV ratio 1.08, patchy groundglass opacities in the right upper lobe, chronic dilatation of pulmonary artery  CTA 10/08/2021-significantly reduced clot burden which is essentially resolved with minimal residual clot.  VQ scan 03/23/2022-high probability for PE in the left lung  CTA 03/25/2022-acute subsegmental left upper lobe pulmonary embolism I have reviewed the images personally.  PFTs: 01/24/2022 FVC 2.89 [100%], FEV1 2.32 [73%], F/F 81, TLC 5.37 [76%], DLCO 22.80 [79%] Mild restriction  Labs:  Sleep:  01/26/2022 Mild sleep apnea with AHI 5.2, low O2 sat of 81%  Cardiac: Echocardiogram 06/28/2021 LVEF 60 to 65%, moderate LVH, normal RV systolic size and function  Echocardiogram 03/23/2022 LVEF 55-60% with normal RV systolic size and function, normal PA systolic pressure  Assessment:  Chronic dyspnea History of pulmonary embolism Coronary artery disease, HfPEF He has H/O documented recurrent venous thromboembolism in 2022, 2023 and per patient he had another PE/DVT in 2018 though I cannot find records of this.  No pulmonary hypertension on recent echocardiogram. He will need indefinite anticoagulation.  He does have history of bright red blood blood per rectum due to hemorrhoids but is not an ongoing issue.  Continue to monitor  PFTs with mild restriction which is secondary to body habitus Suspect dyspnea is multifactorial from obesity, deconditioning, heart failure  Encouraged exercise and weight loss.  Sleep apnea Untreated.  Not currently on CPAP Awaiting in-lab sleep study scheduled for 10/21/2023. -Review results of sleep study upon receipt and consider  re-initiation of CPAP therapy if indicated.   Plan/Recommendations: Weight loss with exercise and diet. Continue indefinite anticoagulation.  Chilton Greathouse MD [facility] 10/04/2023, 1:57 PM  CC: Hilton Cork, FNP

## 2023-10-11 ENCOUNTER — Other Ambulatory Visit: Payer: Self-pay | Admitting: Surgery

## 2023-10-11 DIAGNOSIS — M5412 Radiculopathy, cervical region: Secondary | ICD-10-CM

## 2023-10-22 ENCOUNTER — Ambulatory Visit (HOSPITAL_BASED_OUTPATIENT_CLINIC_OR_DEPARTMENT_OTHER): Payer: Medicaid Other | Attending: Adult Health | Admitting: Pulmonary Disease

## 2023-10-22 DIAGNOSIS — G4733 Obstructive sleep apnea (adult) (pediatric): Secondary | ICD-10-CM | POA: Diagnosis present

## 2023-10-27 ENCOUNTER — Ambulatory Visit
Admission: RE | Admit: 2023-10-27 | Discharge: 2023-10-27 | Disposition: A | Discharge status: Home / Self Care | Payer: Medicaid Other | Source: Ambulatory Visit | Attending: Surgery | Admitting: Surgery

## 2023-10-27 DIAGNOSIS — M5412 Radiculopathy, cervical region: Secondary | ICD-10-CM

## 2023-10-31 DIAGNOSIS — G4733 Obstructive sleep apnea (adult) (pediatric): Secondary | ICD-10-CM | POA: Diagnosis not present

## 2023-11-03 ENCOUNTER — Other Ambulatory Visit: Payer: Medicaid Other

## 2023-11-06 ENCOUNTER — Other Ambulatory Visit: Payer: Self-pay | Admitting: Student

## 2023-11-06 NOTE — Telephone Encounter (Signed)
 This patient seen Dr.Beamer at another location, not at [facility], Office.   I will sent to Dr.Beamer for review

## 2023-11-20 ENCOUNTER — Telehealth: Payer: Self-pay | Admitting: *Deleted

## 2023-11-20 NOTE — Telephone Encounter (Signed)
   Pre-operative Risk Assessment    Patient Name: [name]  DOB: Jun 25, 1961 MRN: [id number]   Date of last office visit: 03/14/2023 Date of next office visit: 12/15/2023   Request for Surgical Clearance    Procedure:   C5 Corpectomy, C4-C6 Platinh  Date of Surgery:  Clearance TBD                                 Surgeon:  Dr. [name] Koch Surgeon's Group or Practice Name:  [facility] Phone number:  [phone] Fax number:  [phone]   Type of Clearance Requested:   - Medical  - Pharmacy:  Hold Apixaban (Eliquis) Not Indicated   Type of Anesthesia:  General    Additional requests/questions:    Signed, Emmit Pomfret   11/20/2023, 2:23 PM

## 2023-11-22 NOTE — Telephone Encounter (Signed)
Pt. Has an upcoming appt. With Dr. Olga Millers on 2/21. If surgery is urgent, please contact to urgent, please contact cardiology.

## 2023-11-22 NOTE — Telephone Encounter (Addendum)
Patient's Eliquis is managed by oncology.  Requesting office will need to obtain recommendations from that provider.  Callback - Please check with surgeon. Pt sees Dr. Jens Som 2/21 for appt. If procedure can be done after this visit, route to Dr. Jens Som so that clearance can be addressed at that visit. Otherwise, pt will need tele visit.  Tereso Newcomer, PA-C    11/22/2023 7:56 AM

## 2023-11-22 NOTE — Telephone Encounter (Signed)
Dr. Maurine Minister surgery scheduler called back and she stated that she will d/w Dr. Maisie Fus when he is back in the office Friday 11/24/23; she did say per notes from Dr. Maisie Fus next available; however she will check with Dr. Maisie Fus and call us back.

## 2023-11-22 NOTE — Telephone Encounter (Signed)
Left message for surgery scheduler that we have faxed notes as FYI to Dr. [name] Koch. If urgent I have asked for their office to please call back ASAP.

## 2023-11-23 ENCOUNTER — Telehealth: Payer: Self-pay | Admitting: Cardiology

## 2023-11-23 NOTE — Telephone Encounter (Signed)
Spoke with patient and he states that he prefers to wait until 12/15/23 in office appt with Dr Jens Som to discuss clearance. I have added preop clearance to appt notes and will route back to requesting surgeons office to make them aware.

## 2023-11-23 NOTE — Telephone Encounter (Signed)
See previous clearance encounter.   PCP, Dr. Sydnee Cabal called in on behalf of patient because he is in office. She states [facility] would like to have patient seen sooner than 2/21 so they can go ahead and schedule his procedure. She states the call can be returned to the patient to reschedule at primary number on file.

## 2023-11-23 NOTE — Telephone Encounter (Signed)
   SEE NOTES PER SCOTT WEAVER, PAC IF SURGERY TO BE DONE BEFORE THE 12/15/23 APPT WITH DR. CRENSHAW PT WILL NEED TELE PREOP APPT.   1ST AVAILABLE TELE PREOP APPT IS 12/05/23  Patient's Eliquis is managed by oncology.  Requesting office will need to obtain recommendations from that provider.   Callback - Please check with surgeon. Pt sees Dr. Jens Som 2/21 for appt. If procedure can be done after this visit, route to Dr. Jens Som so that clearance can be addressed at that visit. Otherwise, pt will need tele visit.  Tereso Newcomer, PA-C    11/22/2023 7:56 AM

## 2023-11-23 NOTE — Telephone Encounter (Signed)
[name] E7 minutes ago (11:08 AM)   KT See previous clearance encounter.    PCP, Dr. Sydnee Cabal called in on behalf of patient because he is in office. She states [facility] would like to have patient seen sooner than 2/21 so they can go ahead and schedule his procedure. She states the call can be returned to the patient to reschedule at primary number on file.          Note   Earnestine Mealing, MD [phone]  [name]

## 2023-11-30 ENCOUNTER — Telehealth: Payer: Self-pay | Admitting: Adult Health

## 2023-11-30 DIAGNOSIS — G4733 Obstructive sleep apnea (adult) (pediatric): Secondary | ICD-10-CM

## 2023-11-30 NOTE — Telephone Encounter (Signed)
 Sleep study results are back , Rec CPAP 16cmH2O. Please place order for CPAP 16cmH2o. Wear all night long  OV in 2 months for follow up

## 2023-12-04 ENCOUNTER — Other Ambulatory Visit: Payer: Self-pay | Admitting: Student

## 2023-12-05 NOTE — Progress Notes (Signed)
HPI: FU CAD and diastolic CHF. Pt had DES to LAD 8/17. Cardiac catheterization February 2019 showed patent stents and normal LV function. Left ventricular end-diastolic pressure elevated at 27 mmHg. Carotid Dopplers April 2019 showed 1 to 39% right and near normal left carotid.  Nuclear study January 2023 showed ejection fraction 51% with no ischemia or infarction.  Patient had his second pulmonary embolus June 2023.  Echocardiogram May 2023 showed normal LV function, mild left atrial enlargement.  Intracranial MRA October 2023 negative. Seen in the emergency room October 2024 with dyspnea and chest pain.  CTA October 2024 showed no pulmonary embolus, enlarged pulmonary artery.  Troponins and BNP normal.  Since last seen he does note some dyspnea on exertion that he attributes to back pain.  No orthopnea or PND.  Mild pedal edema.  He denies exertional chest pain.  No syncope.  Current Outpatient Medications  Medication Sig Dispense Refill   albuterol (VENTOLIN HFA) 108 (90 Base) MCG/ACT inhaler Inhale 2 puffs into the lungs as needed for wheezing or shortness of breath.     atorvastatin (LIPITOR) 80 MG tablet Take 80 mg by mouth daily.     B Complex-C (B-COMPLEX WITH VITAMIN C) tablet Take 1 tablet by mouth daily. 30 tablet 0   carvedilol (COREG) 12.5 MG tablet Take 12.5 mg by mouth 2 (two) times daily.     COMBIVENT RESPIMAT 20-100 MCG/ACT AERS respimat Inhale 1 puff into the lungs 4 (four) times daily.     ELIQUIS 5 MG TABS tablet TAKE 1 TABLET BY MOUTH TWICE A DAY 60 tablet 2   fenofibrate (TRICOR) 145 MG tablet TAKE 1 TABLET (145 MG TOTAL) BY MOUTH DAILY. 90 tablet 3   furosemide (LASIX) 40 MG tablet Take 40 mg by mouth 2 (two) times daily.     gabapentin (NEURONTIN) 800 MG tablet Take 800 mg by mouth 3 (three) times daily.     glipiZIDE (GLUCOTROL) 10 MG tablet Take 10 mg by mouth 2 (two) times daily.  4   HUMULIN R U-500 KWIKPEN 500 UNIT/ML kwikpen Inject 95-120 Units into the skin in  the morning, at noon, and at bedtime. Per sliding scale 3 times daily with meals     Insulin Pen Needle 31G X 5 MM MISC Use 1 needle daily to inject insulin as prescribed 100 each 2   isosorbide dinitrate (ISORDIL) 30 MG tablet Take 30 mg by mouth 2 (two) times daily.     LANTUS SOLOSTAR 100 UNIT/ML Solostar Pen Inject into the skin.     lisinopril (ZESTRIL) 20 MG tablet Take 20 mg by mouth daily.     Menthol, Topical Analgesic, (BIOFREEZE EX) Apply 1 application  topically as needed (knee pain).     metoprolol tartrate (LOPRESSOR) 25 MG tablet Take 25 mg by mouth daily.     nitroGLYCERIN (NITROSTAT) 0.4 MG SL tablet Place 1 tablet (0.4 mg total) under the tongue every 5 (five) minutes as needed for chest pain. 100 tablet 1   ondansetron (ZOFRAN) 4 MG tablet Take 4 mg by mouth as needed for nausea/vomiting, vomiting or nausea.     Oxycodone HCl 20 MG TABS Take 20 mg by mouth every 6 (six) hours as needed (pain).     pantoprazole (PROTONIX) 40 MG tablet Take 40 mg by mouth daily.     potassium chloride (MICRO-K) 10 MEQ CR capsule Take 2 capsules (20 mEq total) by mouth 2 (two) times daily. 120 capsule 3   sertraline (  ZOLOFT) 50 MG tablet Take 50 mg by mouth every morning.     tiZANidine (ZANAFLEX) 4 MG tablet Take 4 mg by mouth at bedtime.     traZODone (DESYREL) 50 MG tablet Take 1 tablet (50 mg total) by mouth at bedtime. 30 tablet 11   triamcinolone cream (KENALOG) 0.1 % Apply 1 Application topically 3 (three) times daily.     Vitamin D, Ergocalciferol, (DRISDOL) 1.25 MG (50000 UT) CAPS capsule Take 50,000 Units by mouth every Monday.     atorvastatin (LIPITOR) 40 MG tablet Take 40 mg by mouth daily. (Patient not taking: Reported on 12/15/2023)     ferrous sulfate 325 (65 FE) MG tablet Take 1 tablet (325 mg total) by mouth daily. 30 tablet 0   isosorbide mononitrate (IMDUR) 30 MG 24 hr tablet TAKE ONE TABLET BY MOUTH DAILY (Patient not taking: Reported on 12/15/2023) 224 tablet 5   potassium  chloride (KLOR-CON) 20 MEQ packet Take 20 mEq by mouth daily. (Patient not taking: Reported on 12/15/2023)     No current facility-administered medications for this visit.     Past Medical History:  Diagnosis Date   Anemia    Arthritis    Back pain    CAD (coronary artery disease)    a. s/p DES to LAD in 05/2016   Cervical radiculopathy    Chronic diastolic CHF (congestive heart failure) (HCC)    Chronic pain    Deep vein thrombosis (HCC) 01/06/2016   Formatting of this note might be different from the original.  Formatting of this note might be different from the original. Last Assessment & Plan: Management per primary care. Last Assessment & Plan: Management per primary care.  Formatting of this note might be different from the original. Last Assessment & Plan: Management per primary care. Last Assessment & Plan: Management per primary care.   Depression    DVT (deep venous thrombosis) (HCC)    Hematemesis    Hepatic steatosis    Hyperlipidemia    Hypertension    IBS (irritable bowel syndrome)    Morbid obesity (HCC)    OSA (obstructive sleep apnea)    Pancreatitis    PE (pulmonary thromboembolism) (HCC)    Peripheral neuropathy    PUD (peptic ulcer disease)    Pulmonary embolism (HCC) 07/15/2021   Renal disorder    [facility])    a. ?details unclear - not seen on imaging when he was admitted in 05/2017 for TIA symptoms which were felt due to cervical radiculopathy.   Thoracic aortic ectasia (HCC)    a. 4.3cm ectatic ascending thoracic aorta by CT 06/2017.    Type 2 diabetes mellitus (HCC)     Past Surgical History:  Procedure Laterality Date   CARDIAC CATHETERIZATION N/A 05/31/2016   Procedure: Left Heart Cath and Coronary Angiography;  Surgeon: Peter M Swaziland, MD;  Location: [facility] INVASIVE CV LAB;  Service: Cardiovascular;  Laterality: N/A;   CARDIAC CATHETERIZATION N/A 05/31/2016   Procedure: Intravascular Pressure Wire/FFR Study;  Surgeon: Peter M Swaziland, MD;  Location: [facility]  INVASIVE CV LAB;  Service: Cardiovascular;  Laterality: N/A;   CARDIAC CATHETERIZATION N/A 05/31/2016   Procedure: Coronary Stent Intervention;  Surgeon: Peter M Swaziland, MD;  Location: [facility] INVASIVE CV LAB;  Service: Cardiovascular;  Laterality: N/A;   COLONOSCOPY N/A 07/17/2021   Procedure: COLONOSCOPY;  Surgeon: Charlott Rakes, MD;  Location: [facility] ENDOSCOPY;  Service: Endoscopy;  Laterality: N/A;   COLONOSCOPY WITH PROPOFOL N/A 04/29/2020   Procedure: COLONOSCOPY WITH  PROPOFOL;  Surgeon: Charlott Rakes, MD;  Location: [facility] ENDOSCOPY;  Service: Endoscopy;  Laterality: N/A;   ESOPHAGOGASTRODUODENOSCOPY N/A 04/29/2020   Procedure: ESOPHAGOGASTRODUODENOSCOPY (EGD);  Surgeon: Charlott Rakes, MD;  Location: [facility] ENDOSCOPY;  Service: Endoscopy;  Laterality: N/A;   ESOPHAGOGASTRODUODENOSCOPY (EGD) WITH PROPOFOL N/A 07/17/2021   Procedure: ESOPHAGOGASTRODUODENOSCOPY (EGD) WITH PROPOFOL;  Surgeon: Charlott Rakes, MD;  Location: [facility] ENDOSCOPY;  Service: Endoscopy;  Laterality: N/A;   GIVENS CAPSULE STUDY N/A 11/10/2022   Procedure: GIVENS CAPSULE STUDY;  Surgeon: Kathi Der, MD;  Location: [facility] ENDOSCOPY;  Service: Gastroenterology;  Laterality: N/A;   LEFT HEART CATH AND CORONARY ANGIOGRAPHY N/A 12/08/2017   Procedure: LEFT HEART CATH AND CORONARY ANGIOGRAPHY;  Surgeon: Marykay Lex, MD;  Location: [facility] INVASIVE CV LAB;  Service: Cardiovascular;  Laterality: N/A;   LEFT HEART CATHETERIZATION WITH CORONARY ANGIOGRAM N/A 02/03/2014   Procedure: LEFT HEART CATHETERIZATION WITH CORONARY ANGIOGRAM;  Surgeon: Chrystie Nose, MD;  Location: [facility] CATH LAB;  Service: Cardiovascular;  Laterality: N/A;   left leg stent      POLYPECTOMY  04/29/2020   Procedure: POLYPECTOMY;  Surgeon: Charlott Rakes, MD;  Location: [facility] ENDOSCOPY;  Service: Endoscopy;;   POLYPECTOMY  07/17/2021   Procedure: POLYPECTOMY;  Surgeon: Charlott Rakes, MD;  Location: [facility] ENDOSCOPY;  Service: Endoscopy;;    Social History    Socioeconomic History   Marital status: Single    Spouse name: Not on file   Number of children: 3   Years of education: 12   Highest education level: Not on file  Occupational History   Occupation: Disabled  Tobacco Use   Smoking status: Never    Passive exposure: Past   Smokeless tobacco: Never  Vaping Use   Vaping status: Never Used  Substance and Sexual Activity   Alcohol use: No   Drug use: Yes    Types: Marijuana   Sexual activity: Not Currently    Partners: Female    Comment: SINGLE  Other Topics Concern   Not on file  Social History Narrative   Independent and ambulatory with cane.   Lives at home alone.   Right-handed.   1-2 cups caffeine per day.   Social Drivers of Corporate investment banker Strain: Not on File (02/10/2022)   [facility] [employer] [facility]   [employer]    Financial Resource Strain: 0  Food Insecurity: Not on File (07/20/2023)   Received from [facility] Insecurity    Food: 0  Transportation Needs: No Transportation Needs (05/14/2023)   PRAPARE - Administrator, Civil Service (Medical): No    Lack of Transportation (Non-Medical): No  Physical Activity: Not on File (02/10/2022)   Received from [place], [place]   Physical Activity    Physical Activity: 0  Stress: Not on File (02/10/2022)   Received from [facility], [place]   Stress    Stress: 0  Social Connections: Not on File (07/06/2023)   Received from [facility]   Social Connections    Connectedness: 0  Intimate Partner Violence: Not At Risk (05/14/2023)   Humiliation, Afraid, Rape, and Kick questionnaire    Fear of Current or Ex-Partner: No    Emotionally Abused: No    Physically Abused: No    Sexually Abused: No    Family History  Problem Relation Age of Onset   Cancer Father    Hypertension Mother    Diabetes Mother    Breast cancer Mother    Hypertension Brother  Diabetes Brother    Hypertension Sister    Diabetes Sister     ROS: Neck and back pain but  no fevers or chills, productive cough, hemoptysis, dysphasia, odynophagia, melena, hematochezia, dysuria, hematuria, rash, seizure activity, orthopnea, PND, claudication. Remaining systems are negative.  Physical Exam: Well-developed morbidly obese in no acute distress.  Skin is warm and dry.  HEENT is normal.  Neck is supple.  Chest is clear to auscultation with normal expansion.  Cardiovascular exam is regular rate and rhythm.  Abdominal exam nontender or distended. No masses palpated. Extremities show trace edema. neuro grossly intact  ECG-December 09, 2023-normal sinus rhythm with lateral T wave inversion.  Personally reviewed  A/P  1 coronary artery disease-continue statin.  He is not on aspirin given need for apixaban.  2 chronic diastolic congestive heart failure-patient has had difficulties with chronic dyspnea and has had occasions of overdiuresis in the past.  Exam is difficult but he appears to be euvolemic.  Will continue diuretic at present dose.  3 chronic chest pain-patient does not have exertional chest pain though occasional brief pain in his chest for seconds.  This is unchanged.  Last nuclear study showed no ischemia.  We will not pursue further evaluation at this time.    4 dyspnea-felt to be multifactorial including chronic diastolic congestive heart failure, obesity hypoventilation syndrome, history of pulmonary emboli and sleep apnea.  5 morbid obesity-we discussed importance of weight loss.  6 recurrent pulmonary emboli-continue Xarelto.  7 hypertension-patient's blood pressure is controlled.  Continue present medications.  Patient is on both Coreg and Toprol.  Will discontinue Toprol.  8 carotid artery disease-mild on most recent Dopplers.  9 preoperative evaluation prior to neck surgery-he denies exertional chest pain and last nuclear study showed no ischemia.  He may proceed without further cardiac evaluation.  Olga Millers, MD

## 2023-12-09 ENCOUNTER — Other Ambulatory Visit: Payer: Self-pay

## 2023-12-09 ENCOUNTER — Emergency Department (HOSPITAL_COMMUNITY): Payer: Medicaid Other

## 2023-12-09 ENCOUNTER — Emergency Department (HOSPITAL_COMMUNITY)
Admission: EM | Admit: 2023-12-09 | Discharge: 2023-12-09 | Disposition: A | Discharge status: Home / Self Care | Payer: Medicaid Other | Attending: Emergency Medicine | Admitting: Emergency Medicine

## 2023-12-09 ENCOUNTER — Encounter (HOSPITAL_COMMUNITY): Payer: Self-pay | Admitting: *Deleted

## 2023-12-09 DIAGNOSIS — Z794 Long term (current) use of insulin: Secondary | ICD-10-CM | POA: Insufficient documentation

## 2023-12-09 DIAGNOSIS — R479 Unspecified speech disturbances: Secondary | ICD-10-CM

## 2023-12-09 DIAGNOSIS — R4781 Slurred speech: Secondary | ICD-10-CM | POA: Insufficient documentation

## 2023-12-09 DIAGNOSIS — R1032 Left lower quadrant pain: Secondary | ICD-10-CM | POA: Insufficient documentation

## 2023-12-09 DIAGNOSIS — Y900 Blood alcohol level of less than 20 mg/100 ml: Secondary | ICD-10-CM | POA: Insufficient documentation

## 2023-12-09 DIAGNOSIS — Z7984 Long term (current) use of oral hypoglycemic drugs: Secondary | ICD-10-CM | POA: Insufficient documentation

## 2023-12-09 DIAGNOSIS — M545 Low back pain, unspecified: Secondary | ICD-10-CM | POA: Diagnosis not present

## 2023-12-09 DIAGNOSIS — I10 Essential (primary) hypertension: Secondary | ICD-10-CM | POA: Diagnosis not present

## 2023-12-09 DIAGNOSIS — E119 Type 2 diabetes mellitus without complications: Secondary | ICD-10-CM | POA: Insufficient documentation

## 2023-12-09 DIAGNOSIS — Z7901 Long term (current) use of anticoagulants: Secondary | ICD-10-CM | POA: Diagnosis not present

## 2023-12-09 DIAGNOSIS — G8929 Other chronic pain: Secondary | ICD-10-CM

## 2023-12-09 LAB — CBC WITH DIFFERENTIAL/PLATELET
Abs Immature Granulocytes: 0.03 10*3/uL (ref 0.00–0.07)
Basophils Absolute: 0 10*3/uL (ref 0.0–0.1)
Basophils Relative: 0 %
Eosinophils Absolute: 0.2 10*3/uL (ref 0.0–0.5)
Eosinophils Relative: 2 %
HCT: 38.5 % — ABNORMAL LOW (ref 39.0–52.0)
Hemoglobin: 12.6 g/dL — ABNORMAL LOW (ref 13.0–17.0)
Immature Granulocytes: 0 %
Lymphocytes Relative: 11 %
Lymphs Abs: 1.1 10*3/uL (ref 0.7–4.0)
MCH: 31.4 pg (ref 26.0–34.0)
MCHC: 32.7 g/dL (ref 30.0–36.0)
MCV: 96 fL (ref 80.0–100.0)
Monocytes Absolute: 0.8 10*3/uL (ref 0.1–1.0)
Monocytes Relative: 8 %
Neutro Abs: 8 10*3/uL — ABNORMAL HIGH (ref 1.7–7.7)
Neutrophils Relative %: 79 %
Platelets: 155 10*3/uL (ref 150–400)
RBC: 4.01 MIL/uL — ABNORMAL LOW (ref 4.22–5.81)
RDW: 14.3 % (ref 11.5–15.5)
WBC: 10.2 10*3/uL (ref 4.0–10.5)
nRBC: 0 % (ref 0.0–0.2)

## 2023-12-09 LAB — COMPREHENSIVE METABOLIC PANEL
ALT: 21 U/L (ref 0–44)
AST: 29 U/L (ref 15–41)
Albumin: 4.1 g/dL (ref 3.5–5.0)
Alkaline Phosphatase: 45 U/L (ref 38–126)
Anion gap: 12 (ref 5–15)
BUN: 15 mg/dL (ref 6–20)
CO2: 27 mmol/L (ref 22–32)
Calcium: 9.1 mg/dL (ref 8.9–10.3)
Chloride: 100 mmol/L (ref 98–111)
Creatinine, Ser: 0.9 mg/dL (ref 0.61–1.24)
GFR, Estimated: >60 mL/min (ref >60)
Glucose, Bld: 177 mg/dL — ABNORMAL HIGH (ref 70–99)
Potassium: 4.1 mmol/L (ref 3.5–5.1)
Sodium: 139 mmol/L (ref 135–145)
Total Bilirubin: 0.8 mg/dL (ref 0.0–1.2)
Total Protein: 7.5 g/dL (ref 6.5–8.1)

## 2023-12-09 LAB — URINALYSIS, ROUTINE W REFLEX MICROSCOPIC
Bilirubin Urine: NEGATIVE
Glucose, UA: NEGATIVE mg/dL
Hgb urine dipstick: NEGATIVE
Ketones, ur: NEGATIVE mg/dL
Leukocytes,Ua: NEGATIVE
Nitrite: NEGATIVE
Protein, ur: NEGATIVE mg/dL
Specific Gravity, Urine: 1.009 (ref 1.005–1.030)
pH: 5 (ref 5.0–8.0)

## 2023-12-09 LAB — BLOOD GAS, VENOUS
Acid-Base Excess: 4 mmol/L — ABNORMAL HIGH (ref 0.0–2.0)
Bicarbonate: 30.6 mmol/L — ABNORMAL HIGH (ref 20.0–28.0)
O2 Saturation: 83.9 %
Patient temperature: 37
pCO2, Ven: 53 mm[Hg] (ref 44–60)
pH, Ven: 7.37 (ref 7.25–7.43)
pO2, Ven: 47 mm[Hg] — ABNORMAL HIGH (ref 32–45)

## 2023-12-09 LAB — ETHANOL: Alcohol, Ethyl (B): <10 mg/dL (ref <10)

## 2023-12-09 LAB — APTT: aPTT: 34 s (ref 24–36)

## 2023-12-09 LAB — TROPONIN I (HIGH SENSITIVITY)
Troponin I (High Sensitivity): 5 ng/L (ref <18)
Troponin I (High Sensitivity): 6 ng/L (ref <18)

## 2023-12-09 LAB — PROTIME-INR
INR: 1.2 (ref 0.8–1.2)
Prothrombin Time: 15.3 s — ABNORMAL HIGH (ref 11.4–15.2)

## 2023-12-09 LAB — LIPASE, BLOOD: Lipase: 35 U/L (ref 11–51)

## 2023-12-09 MED ORDER — LORAZEPAM 2 MG/ML IJ SOLN
1.0000 mg | Freq: Once | INTRAMUSCULAR | Status: AC
Start: 2023-12-09 — End: 2023-12-09
  Administered 2023-12-09: 1 mg via INTRAVENOUS
  Filled 2023-12-09: qty 1

## 2023-12-09 MED ORDER — IOHEXOL 350 MG/ML SOLN
100.0000 mL | Freq: Once | INTRAVENOUS | Status: AC | PRN
  Administered 2023-12-09: 100 mL via INTRAVENOUS

## 2023-12-09 NOTE — Discharge Instructions (Signed)
Return for any new or worse symptoms as you know we were unable to complete the MRI to the brain so whether there was a mini stroke that occurred is not completely ruled out.  Return for any new or worse symptoms.  Follow-up with your doctors.  Continue with your chronic pain medicine.  Rest of workup here today without any acute findings.

## 2023-12-09 NOTE — ED Triage Notes (Signed)
BIB [facility] from home for multiple sx/ complaints. Endorses LLQ abd pain, low back pain, chest pain, light headed, dizziness, nausea, diarrhea. Complex high risk hx, including: MI, CVA, DM, HTN, CKD, CHF. VSS. NSR. CBG 190. Describes as low back radiating to flanks and around to abd, primarily LLQ. PRN O2 at home.

## 2023-12-09 NOTE — ED Provider Notes (Addendum)
[name] Provider Note   CSN: [id number] Arrival date & time: 12/09/23  1415     History  No chief complaint on file.   [name] is a 63 y.o. male.  Patient here today with multiple complaints.  The only new complaint is that he woke up with slurred speech and difficulty speaking.  Associated with some lower extremity weakness but that is been present for days to weeks.  Patient's other complaints include left lower quadrant abdominal pain low back pain without radiation into the legs lightheaded and dizziness.  Nausea and diarrhea.  All of these have been present for several weeks.  Patient normally on 4 L of oxygen at home.  On that currently oxygen sats 94%.  Past medical history significant for prior history of MI CVA diabetes hypertension chronic kidney disease congestive heart failure previous DVT and previous pulmonary embolus.  Patient is still on Eliquis.  In addition patient's past medical history in for type 2 diabetes hypertension pancreatitis hyperlipidemia pulmonary embolism DVT cervical radiculopathy chronic pain chronic diastolic congestive heart failure.  Patient is never used tobacco products.       Home Medications Prior to Admission medications   Medication Sig Start Date End Date Taking? Authorizing Provider  albuterol (VENTOLIN HFA) 108 (90 Base) MCG/ACT inhaler Inhale 2 puffs into the lungs as needed for wheezing or shortness of breath.    [provider]  atorvastatin (LIPITOR) 40 MG tablet Take 40 mg by mouth daily.    [provider]  atorvastatin (LIPITOR) 80 MG tablet Take 80 mg by mouth daily. 07/04/23   [provider]  B Complex-C (B-COMPLEX WITH VITAMIN C) tablet Take 1 tablet by mouth daily. 11/16/22   Rolly Salter, MD  carvedilol (COREG) 12.5 MG tablet Take 12.5 mg by mouth 2 (two) times daily.    [provider]  COMBIVENT RESPIMAT 20-100 MCG/ACT AERS  respimat Inhale 1 puff into the lungs 4 (four) times daily.    [provider]  ELIQUIS 5 MG TABS tablet TAKE 1 TABLET BY MOUTH TWICE A DAY 10/03/22   Benjiman Core, MD  fenofibrate (TRICOR) 145 MG tablet TAKE 1 TABLET (145 MG TOTAL) BY MOUTH DAILY. 05/19/22   Ronney Asters, NP  ferrous sulfate 325 (65 FE) MG tablet Take 1 tablet (325 mg total) by mouth daily. 11/16/22 05/14/23  Rolly Salter, MD  furosemide (LASIX) 40 MG tablet Take 40 mg by mouth 2 (two) times daily.    [provider]  gabapentin (NEURONTIN) 800 MG tablet Take 800 mg by mouth 3 (three) times daily.    [provider]  glipiZIDE (GLUCOTROL) 10 MG tablet Take 10 mg by mouth 2 (two) times daily. 05/30/17   [provider]  HUMULIN R U-500 KWIKPEN 500 UNIT/ML kwikpen Inject 95-120 Units into the skin in the morning, at noon, and at bedtime. Per sliding scale 3 times daily with meals 06/04/20   [provider]  Insulin Pen Needle 31G X 5 MM MISC Use 1 needle daily to inject insulin as prescribed 06/18/17   Vassie Loll, MD  isosorbide dinitrate (ISORDIL) 30 MG tablet Take 30 mg by mouth 2 (two) times daily. 09/01/23   [provider]  isosorbide mononitrate (IMDUR) 30 MG 24 hr tablet TAKE ONE TABLET BY MOUTH DAILY 01/02/23   Hilty, Lisette [name], MD  LANTUS SOLOSTAR 100 UNIT/ML Solostar Pen Inject into the skin. 07/04/23  [provider]  lisinopril (ZESTRIL) 20 MG tablet Take 20 mg by mouth daily. 05/02/23   [provider]  Menthol, Topical Analgesic, (BIOFREEZE EX) Apply 1 application  topically as needed (knee pain).    [provider]  metoprolol tartrate (LOPRESSOR) 25 MG tablet Take 25 mg by mouth daily. 05/05/23   [provider]  nitroGLYCERIN (NITROSTAT) 0.4 MG SL tablet Place 1 tablet (0.4 mg total) under the tongue every 5 (five) minutes as needed for chest pain. 02/06/19   Chilton Si, MD  ondansetron (ZOFRAN) 4 MG tablet Take 4 mg by  mouth as needed for nausea/vomiting, vomiting or nausea. 04/21/20   [provider]  Oxycodone HCl 20 MG TABS Take 20 mg by mouth every 6 (six) hours as needed (pain).    [provider]  pantoprazole (PROTONIX) 40 MG tablet Take 40 mg by mouth daily. 05/26/22   [provider]  potassium chloride (KLOR-CON) 20 MEQ packet Take 20 mEq by mouth daily. 07/13/23   [provider]  potassium chloride (MICRO-K) 10 MEQ CR capsule Take 2 capsules (20 mEq total) by mouth 2 (two) times daily. 07/21/22   Lewayne Bunting, MD  sertraline (ZOLOFT) 50 MG tablet Take 50 mg by mouth every morning. 12/17/20   [provider]  tiZANidine (ZANAFLEX) 4 MG tablet Take 4 mg by mouth at bedtime. 03/31/20   [provider]  traZODone (DESYREL) 50 MG tablet Take 1 tablet (50 mg total) by mouth at bedtime. 12/15/16   Burns, Tinnie Gens, MD  triamcinolone cream (KENALOG) 0.1 % Apply 1 Application topically 3 (three) times daily.    [provider]  Vitamin D, Ergocalciferol, (DRISDOL) 1.25 MG (50000 UT) CAPS capsule Take 50,000 Units by mouth every Monday. 08/15/19   [provider]      Allergies    Coconut fatty acid, Coconut flavor [flavoring agent (non-screening)], Ibuprofen, Aleve [naproxen], Nsaids, Vascepa [icosapent ethyl (epa ethyl ester) (fish)], and Other    Review of Systems   Review of Systems  Physical Exam Updated Vital Signs BP (!) 152/67 (BP Location: Left Arm)   Pulse 71   Temp 98.2 F (36.8 C) (Oral)   Resp 19   Wt (!) 164.2 kg   SpO2 98%   BMI 51.94 kg/m  Physical Exam  ED Results / Procedures / Treatments   Labs (all labs ordered are listed, but only abnormal results are displayed) Labs Reviewed  COMPREHENSIVE METABOLIC PANEL - Abnormal; Notable for the following components:      Result Value   Glucose, Bld 177 (*)    All other components within normal limits  CBC WITH DIFFERENTIAL/PLATELET - Abnormal; Notable for the  following components:   RBC 4.01 (*)    Hemoglobin 12.6 (*)    HCT 38.5 (*)    Neutro Abs 8.0 (*)    All other components within normal limits  URINALYSIS, ROUTINE W REFLEX MICROSCOPIC - Abnormal; Notable for the following components:   Color, Urine STRAW (*)    All other components within normal limits  BLOOD GAS, VENOUS - Abnormal; Notable for the following components:   pO2, Ven 47 (*)    Bicarbonate 30.6 (*)    Acid-Base Excess 4.0 (*)    All other components within normal limits  PROTIME-INR - Abnormal; Notable for the following components:   Prothrombin Time 15.3 (*)    All other components within normal limits  LIPASE, BLOOD  ETHANOL  APTT  RAPID URINE  DRUG SCREEN, HOSP PERFORMED  I-STAT CHEM 8, ED  TROPONIN I (HIGH SENSITIVITY)  TROPONIN I (HIGH SENSITIVITY)    EKG EKG Interpretation Date/Time:  Saturday December 09 2023 14:41:30 EST Ventricular Rate:  77 PR Interval:  181 QRS Duration:  81 QT Interval:  405 QTC Calculation: 459 R Axis:   11  Text Interpretation: Sinus rhythm Abnormal R-wave progression, late transition Nonspecific T abnormalities, lateral leads Confirmed by Vanetta Mulders [phone]) on 12/09/2023 2:55:45 PM  Radiology CT Head Wo Contrast Result Date: 12/09/2023 CLINICAL DATA:  Headache, neurologic deficit, lightheadedness. EXAM: CT HEAD WITHOUT CONTRAST TECHNIQUE: Contiguous axial images were obtained from the base of the skull through the vertex without intravenous contrast. RADIATION DOSE REDUCTION: This exam was performed according to the departmental dose-optimization program which includes automated exposure control, adjustment of the mA and/or kV according to patient size and/or use of iterative reconstruction technique. COMPARISON:  11/21/2022 FINDINGS: Brain: The brainstem, cerebellum, cerebral peduncles, thalami, basal ganglia, basilar cisterns, and ventricular system appear within normal limits. No intracranial hemorrhage, mass lesion, or  acute CVA. Vascular: There is atherosclerotic calcification of the cavernous carotid arteries bilaterally. Skull: Unremarkable Sinuses/Orbits: Mild chronic left ethmoid sinusitis. Suspected mild proptosis. Other: No supplemental non-categorized findings. IMPRESSION: 1. No acute intracranial findings. 2. Mild chronic left ethmoid sinusitis. 3. Suspected mild proptosis. Electronically Signed   By: Gaylyn Rong M.D.   On: 12/09/2023 18:03   CT ABDOMEN PELVIS W CONTRAST Result Date: 12/09/2023 CLINICAL DATA:  Left lower quadrant abdominal pain. Chest pain. Low back pain. Nausea and diarrhea. Lightheadedness. EXAM: CT ANGIOGRAPHY CHEST CT ABDOMEN AND PELVIS WITH CONTRAST TECHNIQUE: Multidetector CT imaging of the chest was performed using the standard protocol during bolus administration of intravenous contrast. Multiplanar CT image reconstructions and MIPs were obtained to evaluate the vascular anatomy. Multidetector CT imaging of the abdomen and pelvis was performed using the standard protocol during bolus administration of intravenous contrast. RADIATION DOSE REDUCTION: This exam was performed according to the departmental dose-optimization program which includes automated exposure control, adjustment of the mA and/or kV according to patient size and/or use of iterative reconstruction technique. CONTRAST:  OMNIPAQUE IOHEXOL 350 MG/ML SOLN COMPARISON:  08/15/2023 and 01/29/2023 FINDINGS: CTA CHEST FINDINGS Cardiovascular: Accounting for motion artifact primarily at the lung bases blurring some of the pulmonary arterial branches especially in the left lower lobe, No filling defect is identified in the pulmonary arterial tree to suggest pulmonary embolus. Mild atheromatous vascular calcification in the left anterior descending coronary artery and thoracic aorta. Mild cardiomegaly. Mediastinum/Nodes: Mild prominence of mediastinal adipose tissues. No pathologic adenopathy. Lungs/Pleura: Mild scarring in  both lower lobes. Musculoskeletal: Bilateral gynecomastia. Thoracic spondylosis with multilevel bridging spurring. Review of the MIP images confirms the above findings. CT ABDOMEN and PELVIS FINDINGS Hepatobiliary: Unremarkable Pancreas: Unremarkable Spleen: Chronic subtle geographic splenic heterogeneity laterally in the vicinity of a region of suspected mild scarring. Adrenals/Urinary Tract: Adrenal glands unremarkable. Two punctate 2 mm right kidney lower pole nonobstructive renal calculi. Urinary bladder unremarkable. Stomach/Bowel: Unremarkable Vascular/Lymphatic: Atherosclerosis is present, including aortoiliac atherosclerotic disease. 1.7 cm left inguinal lymph node on image 97 series 3, stable from 01/29/2023 and measuring 1.8 cm on 05/01/2022, significance of this chronic mild nodal enlargement uncertain. Reproductive: Small right scrotal hydrocele. Other: No supplemental non-categorized findings. Musculoskeletal: Bridging spurring along the right anterior sacroiliac joint. Right greater than left degenerative hip arthropathy. Small umbilical hernia contains adipose tissues. Lower lumbar spondylosis and degenerative disc disease with impingement observed at L4-5 and L5-S1.  Review of the MIP images confirms the above findings. IMPRESSION: 1. No filling defect is identified in the pulmonary arterial tree to suggest pulmonary embolus. 2. Mild cardiomegaly. 3. Two punctate 2 mm right kidney lower pole nonobstructive renal calculi. 4. Chronic mild left inguinal lymph node enlargement, stable from 01/29/2023 and measuring 1.8 cm on 05/01/2022, significance of this chronic mild nodal enlargement uncertain. 5. Small right scrotal hydrocele. 6. Lower lumbar spondylosis and degenerative disc disease with impingement observed at L4-5 and L5-S1. 7. Small umbilical hernia contains adipose tissues. 8. Bilateral gynecomastia. 9. Aortic atherosclerosis. Aortic Atherosclerosis (ICD10-I70.0). Electronically Signed   By:  Gaylyn Rong M.D.   On: 12/09/2023 17:58   CT Angio Chest PE W/Cm &/Or Wo Cm Result Date: 12/09/2023 CLINICAL DATA:  Left lower quadrant abdominal pain. Chest pain. Low back pain. Nausea and diarrhea. Lightheadedness. EXAM: CT ANGIOGRAPHY CHEST CT ABDOMEN AND PELVIS WITH CONTRAST TECHNIQUE: Multidetector CT imaging of the chest was performed using the standard protocol during bolus administration of intravenous contrast. Multiplanar CT image reconstructions and MIPs were obtained to evaluate the vascular anatomy. Multidetector CT imaging of the abdomen and pelvis was performed using the standard protocol during bolus administration of intravenous contrast. RADIATION DOSE REDUCTION: This exam was performed according to the departmental dose-optimization program which includes automated exposure control, adjustment of the mA and/or kV according to patient size and/or use of iterative reconstruction technique. CONTRAST:  OMNIPAQUE IOHEXOL 350 MG/ML SOLN COMPARISON:  08/15/2023 and 01/29/2023 FINDINGS: CTA CHEST FINDINGS Cardiovascular: Accounting for motion artifact primarily at the lung bases blurring some of the pulmonary arterial branches especially in the left lower lobe, No filling defect is identified in the pulmonary arterial tree to suggest pulmonary embolus. Mild atheromatous vascular calcification in the left anterior descending coronary artery and thoracic aorta. Mild cardiomegaly. Mediastinum/Nodes: Mild prominence of mediastinal adipose tissues. No pathologic adenopathy. Lungs/Pleura: Mild scarring in both lower lobes. Musculoskeletal: Bilateral gynecomastia. Thoracic spondylosis with multilevel bridging spurring. Review of the MIP images confirms the above findings. CT ABDOMEN and PELVIS FINDINGS Hepatobiliary: Unremarkable Pancreas: Unremarkable Spleen: Chronic subtle geographic splenic heterogeneity laterally in the vicinity of a region of suspected mild scarring. Adrenals/Urinary Tract:  Adrenal glands unremarkable. Two punctate 2 mm right kidney lower pole nonobstructive renal calculi. Urinary bladder unremarkable. Stomach/Bowel: Unremarkable Vascular/Lymphatic: Atherosclerosis is present, including aortoiliac atherosclerotic disease. 1.7 cm left inguinal lymph node on image 97 series 3, stable from 01/29/2023 and measuring 1.8 cm on 05/01/2022, significance of this chronic mild nodal enlargement uncertain. Reproductive: Small right scrotal hydrocele. Other: No supplemental non-categorized findings. Musculoskeletal: Bridging spurring along the right anterior sacroiliac joint. Right greater than left degenerative hip arthropathy. Small umbilical hernia contains adipose tissues. Lower lumbar spondylosis and degenerative disc disease with impingement observed at L4-5 and L5-S1. Review of the MIP images confirms the above findings. IMPRESSION: 1. No filling defect is identified in the pulmonary arterial tree to suggest pulmonary embolus. 2. Mild cardiomegaly. 3. Two punctate 2 mm right kidney lower pole nonobstructive renal calculi. 4. Chronic mild left inguinal lymph node enlargement, stable from 01/29/2023 and measuring 1.8 cm on 05/01/2022, significance of this chronic mild nodal enlargement uncertain. 5. Small right scrotal hydrocele. 6. Lower lumbar spondylosis and degenerative disc disease with impingement observed at L4-5 and L5-S1. 7. Small umbilical hernia contains adipose tissues. 8. Bilateral gynecomastia. 9. Aortic atherosclerosis. Aortic Atherosclerosis (ICD10-I70.0). Electronically Signed   By: Gaylyn Rong M.D.   On: 12/09/2023 17:58   DG Chest [facility] 1 View Result  Date: 12/09/2023 CLINICAL DATA:  Chest pain. EXAM: PORTABLE CHEST 1 VIEW COMPARISON:  09/14/2023 FINDINGS: Stable cardiomegaly. Unchanged mediastinal contours. No focal airspace disease, large pleural effusion or pneumothorax. No pulmonary edema. IMPRESSION: Stable cardiomegaly.  No acute pulmonary process.  Electronically Signed   By: Narda Rutherford M.D.   On: 12/09/2023 16:21    Procedures Procedures    Medications Ordered in ED Medications  iohexol (OMNIPAQUE) 350 MG/ML injection 100 mL (100 mLs Intravenous Contrast Given 12/09/23 1705)  LORazepam (ATIVAN) injection 1 mg (1 mg Intravenous Given 12/09/23 1901)    ED Course/ Medical Decision Making/ A&P                                 Medical Decision Making Amount and/or Complexity of Data Reviewed Labs: ordered. Radiology: ordered.  Risk Prescription drug management.   Patient here with multiple complaints.  Most of them have been present for several weeks.  With the exception that now awoke this morning with speech problems.  Went to bed around 10:00 last evening.  Without that.  Based on this we will get CT head stroke order set.  But in addition we will work him up for the chest pain the abdominal pain and low back pain.  For those we will get CT angio to rule out PE since he has had that in the past.  And CT of the abdomen to evaluate the left lower quadrant pain as well as back pain.  CRITICAL CARE Performed by: Vanetta Mulders Total critical care time: 45 minutes Critical care time was exclusive of separately billable procedures and treating other patients. Critical care was necessary to treat or prevent imminent or life-threatening deterioration. Critical care was time spent personally by me on the following activities: development of treatment plan with patient and/or surrogate as well as nursing, discussions with consultants, evaluation of patient's response to treatment, examination of patient, obtaining history from patient or surrogate, ordering and performing treatments and interventions, ordering and review of laboratory studies, ordering and review of radiographic studies, pulse oximetry and re-evaluation of patient's condition.   If patient's CT head without acute findings.  Patient will get MRI.  Patient seems  to be on oxycodone chronically for pain management.  Workup here today without any acute findings.  Patient's venous blood gas was reassuring 7.37 pH and pCO2 is 53.  Alcohol less than 10 INR 1.2 PTT was 34 troponin was 6 initial troponin was 5.  Complete metabolic panel was normal.  Blood sugar up a little bit at 177 CBC no leukocytosis hemoglobin 12.6 platelets 155.  Urinalysis negative.  CT head without any acute findings.  Attempted to do MRI but patient could not tolerate the MRI even with Ativan he refused to have it completed.  He is aware that we are unable to completely rule out the cause of his speech abnormality.  He claims it is all back to normal now.  Does not want MRI.  CT abdomen pelvis and CT angio chest without any acute findings.  There was some incidental findings.  To include a small right scrotal hydrocele chronic mild left inguinal lymph node enlargement stable since April 2024.  Mild cardiomegaly 2 punctate 2 mm right kidney lower pole nonobstructive renal calculi.  Small umbilical hernia that contains adipose only.  Since unable to complete MRI patient refusing.  Patient wants to be discharged home.  Patient understands that we have  not completely ruled out CVA.  But also patient states that all symptoms have resolved.  Still could have been TIA.  Patient wants to be discharged home he has chronic pain medicine at home.   Final Clinical Impression(s) / ED Diagnoses Final diagnoses:  Difficulty with speech  Left lower quadrant abdominal pain  Chronic bilateral low back pain without sciatica    Rx / DC Orders ED Discharge Orders     None         Vanetta Mulders, MD 12/09/23 1541    Vanetta Mulders, MD 12/09/23 1542    Vanetta Mulders, MD 12/09/23 2013

## 2023-12-09 NOTE — ED Notes (Signed)
Pt placed on 4L [place] per patient's home use of oxygen

## 2023-12-12 NOTE — Telephone Encounter (Signed)
Can we please let patient know , ordered placed

## 2023-12-12 NOTE — Telephone Encounter (Signed)
Patient advised of CPAP order. Patient will call back after CPAP has been delivered to schedule appt for CPAP compliance. Nothing further needed. sd

## 2023-12-13 ENCOUNTER — Telehealth: Payer: Self-pay | Admitting: Neurology

## 2023-12-13 NOTE — Telephone Encounter (Signed)
Neurosurgeon Dr. Lucia Bitter evaluation from November 20, 2023, severe cervical stenosis, progressive myelopathy, on anticoagulation, anti-inflammatory, recommended C5 corpectomy and C4-6 plating

## 2023-12-15 ENCOUNTER — Ambulatory Visit: Payer: Medicaid Other | Attending: Cardiology | Admitting: Cardiology

## 2023-12-15 ENCOUNTER — Encounter: Payer: Self-pay | Admitting: Cardiology

## 2023-12-15 VITALS — BP 124/65 | HR 86 | Ht 70.0 in | Wt 358.8 lb

## 2023-12-15 DIAGNOSIS — I25119 Atherosclerotic heart disease of native coronary artery with unspecified angina pectoris: Secondary | ICD-10-CM | POA: Diagnosis not present

## 2023-12-15 DIAGNOSIS — I2699 Other pulmonary embolism without acute cor pulmonale: Secondary | ICD-10-CM

## 2023-12-15 DIAGNOSIS — E785 Hyperlipidemia, unspecified: Secondary | ICD-10-CM

## 2023-12-15 DIAGNOSIS — I10 Essential (primary) hypertension: Secondary | ICD-10-CM | POA: Diagnosis not present

## 2023-12-15 DIAGNOSIS — Z01810 Encounter for preprocedural cardiovascular examination: Secondary | ICD-10-CM

## 2023-12-15 NOTE — Patient Instructions (Signed)
Medication Instructions:  Your physician has recommended you make the following change in your medication:   -Stop metoprolol tartrate (lopressor).  *If you need a refill on your cardiac medications before your next appointment, please call your pharmacy*    Follow-Up: At [facility], you and your health needs are our priority.  As part of our continuing mission to provide you with exceptional heart care, we have created designated Provider Care Teams.  These Care Teams include your primary Cardiologist (physician) and Advanced Practice Providers (APPs -  Physician Assistants and Nurse Practitioners) who all work together to provide you with the care you need, when you need it.  We recommend signing up for the patient portal called "MyChart".  Sign up information is provided on this After Visit Summary.  MyChart is used to connect with patients for Virtual Visits (Telemedicine).  Patients are able to view lab/test results, encounter notes, upcoming appointments, etc.  Non-urgent messages can be sent to your provider as well.   To learn more about what you can do with MyChart, go to [email or web address].    Your next appointment:   6 month(s)  Provider:   Olga Millers, MD     Other Instructions   1st Floor: - Lobby - Registration  - Pharmacy  - Lab - Cafe  2nd Floor: - PV Lab - Diagnostic Testing (echo, CT, nuclear med)  3rd Floor: - Vacant  4th Floor: - TCTS (cardiothoracic surgery) - AFib Clinic - Structural Heart Clinic - Vascular Surgery  - Vascular Ultrasound  5th Floor: - [facility] Cardiology (general and EP) - Clinical Pharmacy for coumadin, hypertension, lipid, weight-loss medications, and med management appointments    Valet parking services will be available as well.

## 2023-12-26 ENCOUNTER — Telehealth: Payer: Self-pay | Admitting: Adult Health

## 2023-12-26 NOTE — Telephone Encounter (Signed)
 Called and spoke with patient, he states he received a call from [employer] and they will set up his CPAP on 01/01/24.  I asked that he call after he gets set up on his CPAP to get scheduled for his 3 month f/u.  He verbalized understanding.  Nothing further needed.

## 2023-12-27 ENCOUNTER — Ambulatory Visit: Payer: Medicaid Other | Admitting: Podiatry

## 2023-12-27 NOTE — Telephone Encounter (Signed)
 Faxed back 12/27/23 confirmation received

## 2024-01-04 ENCOUNTER — Ambulatory Visit: Admitting: Podiatry

## 2024-01-04 DIAGNOSIS — B351 Tinea unguium: Secondary | ICD-10-CM

## 2024-01-04 DIAGNOSIS — M79674 Pain in right toe(s): Secondary | ICD-10-CM

## 2024-01-04 DIAGNOSIS — M79675 Pain in left toe(s): Secondary | ICD-10-CM

## 2024-01-04 DIAGNOSIS — E1142 Type 2 diabetes mellitus with diabetic polyneuropathy: Secondary | ICD-10-CM

## 2024-01-04 NOTE — Progress Notes (Signed)
This patient presents  to my office for at risk foot care.  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  This patient presents for at risk foot care today.  General Appearance  Alert, conversant and in no acute stress.  Vascular  Dorsalis pedis   pulses are absent bilaterally. Posterior tibial pulses are absent due to swelling. Capillary return is within normal limits  bilaterally. Temperature is within normal limits  bilaterally.  Venous stasis  B/L.  Neurologic  Senn-Weinstein monofilament wire test within normal limits  bilaterally. Muscle power within normal limits bilaterally.  Nails Thick disfigured discolored nails with subungual debris  from hallux to fifth toes bilaterally. No evidence of bacterial infection or drainage bilaterally.  Orthopedic  No limitations of motion  feet .  No crepitus or effusions noted.  No bony pathology or digital deformities noted.  Skin  dry skin with no porokeratosis noted bilaterally.  No signs of infections or ulcers noted.     Onychomycosis  Pain in right toes  Pain in left toes  Consent was obtained for treatment procedures.   Mechanical debridement of nails 1-5  bilaterally performed with a nail nipper.  Filed with dremel without incident.    Return office visit    3 months                 Told patient to return for periodic foot care and evaluation due to potential at risk complications.   Gardiner Barefoot DPM

## 2024-02-06 ENCOUNTER — Other Ambulatory Visit: Payer: Self-pay | Admitting: *Deleted

## 2024-02-06 DIAGNOSIS — E785 Hyperlipidemia, unspecified: Secondary | ICD-10-CM

## 2024-02-26 ENCOUNTER — Other Ambulatory Visit: Payer: Self-pay | Admitting: Neurosurgery

## 2024-03-01 NOTE — Pre-Procedure Instructions (Signed)
 Surgical Instructions   Your procedure is scheduled on Mar 13, 2024. Report to [facility] Main Entrance "A" at 10:30 A.M., then check in with the Admitting office. Any questions or running late day of surgery: call [phone]  Questions prior to your surgery date: call [phone], Monday-Friday, 8am-4pm. If you experience any cold or flu symptoms such as cough, fever, chills, shortness of breath, etc. between now and your scheduled surgery, please notify us  at the above number.     Remember:  Do not eat or drink after midnight the night before your surgery    Take these medicines the morning of surgery with A SIP OF WATER: atorvastatin  (LIPITOR )  carvedilol  (COREG )  COMBIVENT  RESPIMAT inhaler dorzolamide-timolol  (COSOPT) ophthalmic solution  gabapentin  (NEURONTIN )  isosorbide  dinitrate (ISORDIL )  pantoprazole  (PROTONIX )  sertraline  (ZOLOFT )    May take these medicines IF NEEDED: albuterol  (VENTOLIN  HFA) 108 (90 Base) MCG/ACT inhaler  nitroGLYCERIN  (NITROSTAT ) - if dose taken prior to surgery, please call either of the above phone numbers ondansetron  (ZOFRAN )  Oxycodone  HCl    STOP taking your ELIQUIS  two days prior to surgery. Your last dose will be May 18th.   One week prior to surgery, STOP taking any Aspirin  (unless otherwise instructed by your surgeon) Aleve , Naproxen , Ibuprofen, Motrin, Advil, Goody's, BC's, all herbal medications, fish oil , and non-prescription vitamins.   WHAT DO I DO ABOUT MY DIABETES MEDICATION?   Do not take glipiZIDE  (GLUCOTROL ) the evening before surgery or the morning of surgery.  DO NOT take your bedtime dose of HUMULIN  R U-500 KWIKPEN the night before surgery.  If your CBG is greater than 220 mg/dL morning of surgery, you may take  of dose of HUMULIN  R U-500 KWIKPEN insulin .   HOW TO MANAGE YOUR DIABETES BEFORE AND AFTER SURGERY  Why is it important to control my blood sugar before and after surgery? Improving blood sugar  levels before and after surgery helps healing and can limit problems. A way of improving blood sugar control is eating a healthy diet by:  Eating less sugar and carbohydrates  Increasing activity/exercise  Talking with your doctor about reaching your blood sugar goals High blood sugars (greater than 180 mg/dL) can raise your risk of infections and slow your recovery, so you will need to focus on controlling your diabetes during the weeks before surgery. Make sure that the doctor who takes care of your diabetes knows about your planned surgery including the date and location.  How do I manage my blood sugar before surgery? Check your blood sugar at least 4 times a day, starting 2 days before surgery, to make sure that the level is not too high or low.  Check your blood sugar the morning of your surgery when you wake up and every 2 hours until you get to the Short Stay unit.  If your blood sugar is less than 70 mg/dL, you will need to treat for low blood sugar: Do not take insulin . Treat a low blood sugar (less than 70 mg/dL) with  cup of clear juice (cranberry or apple), 4 glucose tablets, OR glucose gel. Recheck blood sugar in 15 minutes after treatment (to make sure it is greater than 70 mg/dL). If your blood sugar is not greater than 70 mg/dL on recheck, call [phone] for further instructions. Report your blood sugar to the short stay nurse when you get to Short Stay.  If you are admitted to the hospital after surgery: Your blood sugar will be checked by the staff  and you will probably be given insulin  after surgery (instead of oral diabetes medicines) to make sure you have good blood sugar levels. The goal for blood sugar control after surgery is 80-180 mg/dL.                      Do NOT Smoke (Tobacco/Vaping) for 24 hours prior to your procedure.  If you use a CPAP at night, you may bring your mask/headgear for your overnight stay.   You will be asked to remove any contacts,  glasses, piercing's, hearing aid's, dentures/partials prior to surgery. Please bring cases for these items if needed.    Patients discharged the day of surgery will not be allowed to drive home, and someone needs to stay with them for 24 hours.  SURGICAL WAITING ROOM VISITATION Patients may have no more than 2 support people in the waiting area - these visitors may rotate.   Pre-op nurse will coordinate an appropriate time for 1 ADULT support person, who may not rotate, to accompany patient in pre-op.  Children under the age of 76 must have an adult with them who is not the patient and must remain in the main waiting area with an adult.  If the patient needs to stay at the hospital during part of their recovery, the visitor guidelines for inpatient rooms apply.  Please refer to the [facility] website for the visitor guidelines for any additional information.   If you received a COVID test during your pre-op visit  it is requested that you wear a mask when out in public, stay away from anyone that may not be feeling well and notify your surgeon if you develop symptoms. If you have been in contact with anyone that has tested positive in the last 10 days please notify you surgeon.      Pre-operative 5 CHG Bathing Instructions   You can play a key role in reducing the risk of infection after surgery. Your skin needs to be as free of germs as possible. You can reduce the number of germs on your skin by washing with CHG (chlorhexidine  gluconate) soap before surgery. CHG is an antiseptic soap that kills germs and continues to kill germs even after washing.   DO NOT use if you have an allergy to chlorhexidine /CHG or antibacterial soaps. If your skin becomes reddened or irritated, stop using the CHG and notify one of our RNs at [phone].   Please shower with the CHG soap starting 4 days before surgery using the following schedule:     Please keep in mind the following:  DO NOT shave,  including legs and underarms, starting the day of your first shower.   You may shave your face at any point before/day of surgery.  Place clean sheets on your bed the day you start using CHG soap. Use a clean washcloth (not used since being washed) for each shower. DO NOT sleep with pets once you start using the CHG.   CHG Shower Instructions:  Wash your face and private area with normal soap. If you choose to wash your hair, wash first with your normal shampoo.  After you use shampoo/soap, rinse your hair and body thoroughly to remove shampoo/soap residue.  Turn the water OFF and apply about 3 tablespoons (45 ml) of CHG soap to a CLEAN washcloth.  Apply CHG soap ONLY FROM YOUR NECK DOWN TO YOUR TOES (washing for 3-5 minutes)  DO NOT use CHG soap on face, private areas,  open wounds, or sores.  Pay special attention to the area where your surgery is being performed.  If you are having back surgery, having someone wash your back for you may be helpful. Wait 2 minutes after CHG soap is applied, then you may rinse off the CHG soap.  Pat dry with a clean towel  Put on clean clothes/pajamas   If you choose to wear lotion, please use ONLY the CHG-compatible lotions that are listed below.  Additional instructions for the day of surgery: DO NOT APPLY any lotions, deodorants, cologne, or perfumes.   Do not bring valuables to the hospital. [facility] is not responsible for any belongings/valuables. Do not wear nail polish, gel polish, artificial nails, or any other type of covering on natural nails (fingers and toes) Do not wear jewelry or makeup Put on clean/comfortable clothes.  Please brush your teeth.  Ask your nurse before applying any prescription medications to the skin.     CHG Compatible Lotions   Aveeno Moisturizing lotion  Cetaphil Moisturizing Cream  Cetaphil Moisturizing Lotion  Clairol Herbal Essence Moisturizing Lotion, Dry Skin  Clairol Herbal Essence Moisturizing Lotion,  Extra Dry Skin  Clairol Herbal Essence Moisturizing Lotion, Normal Skin  Curel Age Defying Therapeutic Moisturizing Lotion with Alpha Hydroxy  Curel Extreme Care Body Lotion  Curel Soothing Hands Moisturizing Hand Lotion  Curel Therapeutic Moisturizing Cream, Fragrance-Free  Curel Therapeutic Moisturizing Lotion, Fragrance-Free  Curel Therapeutic Moisturizing Lotion, Original Formula  Eucerin Daily Replenishing Lotion  Eucerin Dry Skin Therapy Plus Alpha Hydroxy Crme  Eucerin Dry Skin Therapy Plus Alpha Hydroxy Lotion  Eucerin Original Crme  Eucerin Original Lotion  Eucerin Plus Crme Eucerin Plus Lotion  Eucerin TriLipid Replenishing Lotion  Keri Anti-Bacterial Hand Lotion  Keri Deep Conditioning Original Lotion Dry Skin Formula Softly Scented  Keri Deep Conditioning Original Lotion, Fragrance Free Sensitive Skin Formula  Keri Lotion Fast Absorbing Fragrance Free Sensitive Skin Formula  Keri Lotion Fast Absorbing Softly Scented Dry Skin Formula  Keri Original Lotion  Keri Skin Renewal Lotion Keri Silky Smooth Lotion  Keri Silky Smooth Sensitive Skin Lotion  Nivea Body Creamy Conditioning Oil  Nivea Body Extra Enriched Lotion  Nivea Body Original Lotion  Nivea Body Sheer Moisturizing Lotion Nivea Crme  Nivea Skin Firming Lotion  NutraDerm 30 Skin Lotion  NutraDerm Skin Lotion  NutraDerm Therapeutic Skin Cream  NutraDerm Therapeutic Skin Lotion  ProShield Protective Hand Cream  Provon moisturizing lotion  Please read over the following fact sheets that you were given.

## 2024-03-04 ENCOUNTER — Encounter (HOSPITAL_COMMUNITY): Payer: Self-pay

## 2024-03-04 ENCOUNTER — Other Ambulatory Visit: Payer: Self-pay

## 2024-03-04 ENCOUNTER — Encounter (HOSPITAL_COMMUNITY)
Admission: RE | Admit: 2024-03-04 | Discharge: 2024-03-04 | Disposition: A | Discharge status: Home / Self Care | Source: Ambulatory Visit | Attending: Neurosurgery | Admitting: Neurosurgery

## 2024-03-04 VITALS — BP 130/73 | HR 71 | Temp 98.2°F | Resp 18 | Ht 70.0 in | Wt 369.2 lb

## 2024-03-04 DIAGNOSIS — K429 Umbilical hernia without obstruction or gangrene: Secondary | ICD-10-CM | POA: Insufficient documentation

## 2024-03-04 DIAGNOSIS — I2510 Atherosclerotic heart disease of native coronary artery without angina pectoris: Secondary | ICD-10-CM | POA: Diagnosis not present

## 2024-03-04 DIAGNOSIS — N62 Hypertrophy of breast: Secondary | ICD-10-CM | POA: Insufficient documentation

## 2024-03-04 DIAGNOSIS — Z8673 Personal history of transient ischemic attack (TIA), and cerebral infarction without residual deficits: Secondary | ICD-10-CM | POA: Diagnosis not present

## 2024-03-04 DIAGNOSIS — E785 Hyperlipidemia, unspecified: Secondary | ICD-10-CM | POA: Diagnosis not present

## 2024-03-04 DIAGNOSIS — I110 Hypertensive heart disease with heart failure: Secondary | ICD-10-CM | POA: Diagnosis not present

## 2024-03-04 DIAGNOSIS — E1122 Type 2 diabetes mellitus with diabetic chronic kidney disease: Secondary | ICD-10-CM | POA: Diagnosis not present

## 2024-03-04 DIAGNOSIS — M5106 Intervertebral disc disorders with myelopathy, lumbar region: Secondary | ICD-10-CM | POA: Diagnosis not present

## 2024-03-04 DIAGNOSIS — K589 Irritable bowel syndrome without diarrhea: Secondary | ICD-10-CM | POA: Insufficient documentation

## 2024-03-04 DIAGNOSIS — I5032 Chronic diastolic (congestive) heart failure: Secondary | ICD-10-CM | POA: Diagnosis not present

## 2024-03-04 DIAGNOSIS — E1165 Type 2 diabetes mellitus with hyperglycemia: Secondary | ICD-10-CM | POA: Insufficient documentation

## 2024-03-04 DIAGNOSIS — G4733 Obstructive sleep apnea (adult) (pediatric): Secondary | ICD-10-CM | POA: Insufficient documentation

## 2024-03-04 DIAGNOSIS — Z01812 Encounter for preprocedural laboratory examination: Secondary | ICD-10-CM | POA: Insufficient documentation

## 2024-03-04 DIAGNOSIS — M4716 Other spondylosis with myelopathy, lumbar region: Secondary | ICD-10-CM | POA: Diagnosis not present

## 2024-03-04 DIAGNOSIS — R590 Localized enlarged lymph nodes: Secondary | ICD-10-CM | POA: Diagnosis not present

## 2024-03-04 DIAGNOSIS — I77810 Thoracic aortic ectasia: Secondary | ICD-10-CM | POA: Diagnosis not present

## 2024-03-04 DIAGNOSIS — E1142 Type 2 diabetes mellitus with diabetic polyneuropathy: Secondary | ICD-10-CM | POA: Diagnosis not present

## 2024-03-04 DIAGNOSIS — Z8711 Personal history of peptic ulcer disease: Secondary | ICD-10-CM | POA: Diagnosis not present

## 2024-03-04 DIAGNOSIS — N433 Hydrocele, unspecified: Secondary | ICD-10-CM | POA: Insufficient documentation

## 2024-03-04 DIAGNOSIS — N189 Chronic kidney disease, unspecified: Secondary | ICD-10-CM | POA: Diagnosis not present

## 2024-03-04 DIAGNOSIS — M4712 Other spondylosis with myelopathy, cervical region: Secondary | ICD-10-CM | POA: Insufficient documentation

## 2024-03-04 DIAGNOSIS — K760 Fatty (change of) liver, not elsewhere classified: Secondary | ICD-10-CM | POA: Diagnosis not present

## 2024-03-04 DIAGNOSIS — I700 Atherosclerosis of aorta: Secondary | ICD-10-CM | POA: Diagnosis not present

## 2024-03-04 DIAGNOSIS — Z01818 Encounter for other preprocedural examination: Secondary | ICD-10-CM

## 2024-03-04 DIAGNOSIS — Z86718 Personal history of other venous thrombosis and embolism: Secondary | ICD-10-CM | POA: Insufficient documentation

## 2024-03-04 DIAGNOSIS — Z794 Long term (current) use of insulin: Secondary | ICD-10-CM | POA: Insufficient documentation

## 2024-03-04 HISTORY — DX: Acute myocardial infarction, unspecified: I21.9

## 2024-03-04 HISTORY — DX: Gastro-esophageal reflux disease without esophagitis: K21.9

## 2024-03-04 HISTORY — DX: Dyspnea, unspecified: R06.00

## 2024-03-04 LAB — BASIC METABOLIC PANEL WITH GFR
Anion gap: 7 (ref 5–15)
BUN: 8 mg/dL (ref 6–20)
CO2: 31 mmol/L (ref 22–32)
Calcium: 9.2 mg/dL (ref 8.9–10.3)
Chloride: 105 mmol/L (ref 98–111)
Creatinine, Ser: 0.71 mg/dL (ref 0.61–1.24)
GFR, Estimated: >60 mL/min (ref >60)
Glucose, Bld: 135 mg/dL — ABNORMAL HIGH (ref 70–99)
Potassium: 3.4 mmol/L — ABNORMAL LOW (ref 3.5–5.1)
Sodium: 143 mmol/L (ref 135–145)

## 2024-03-04 LAB — CBC
HCT: 38.5 % — ABNORMAL LOW (ref 39.0–52.0)
Hemoglobin: 12.7 g/dL — ABNORMAL LOW (ref 13.0–17.0)
MCH: 31.8 pg (ref 26.0–34.0)
MCHC: 33 g/dL (ref 30.0–36.0)
MCV: 96.3 fL (ref 80.0–100.0)
Platelets: 187 10*3/uL (ref 150–400)
RBC: 4 MIL/uL — ABNORMAL LOW (ref 4.22–5.81)
RDW: 13.6 % (ref 11.5–15.5)
WBC: 9.1 10*3/uL (ref 4.0–10.5)
nRBC: 0 % (ref 0.0–0.2)

## 2024-03-04 LAB — TYPE AND SCREEN
ABO/RH(D): O POS
Antibody Screen: NEGATIVE

## 2024-03-04 LAB — SURGICAL PCR SCREEN
MRSA, PCR: NEGATIVE
Staphylococcus aureus: NEGATIVE

## 2024-03-04 LAB — GLUCOSE, CAPILLARY: Glucose-Capillary: 145 mg/dL — ABNORMAL HIGH (ref 70–99)

## 2024-03-04 NOTE — Progress Notes (Signed)
 PCP - Dr. Valrie Gehrig Cardiologist - Dr. Alexandria Angel, LOV 12/15/2023, cardiac clearance Nephrologist: Dr. Evone Hoh, WF, LOV 02/26/2024  PPM/ICD - denies Device Orders - na Rep Notified - na  Chest x-ray - 12/09/2023 EKG - 12/09/2023 Stress Test - 11/22/2021 ECHO - 03/23/2022 Cardiac Cath - 12/08/2017  Sleep Study - OSA wear oxygen  4L/[place] at night at PRN SOB CPAP - does not wear  Type II diabetic.  Blood sugar145 at PAT appointment Fasting Blood Sugar - less than 160 Checks Blood Sugar: three times a day   Last dose of GLP1 agonist-  denies GLP1 instructions: na  Blood Thinner Instructions: Eliquis , stop 2 days prior to surgery with last dose 03/10/2024 Aspirin  Instructions:denies  ERAS Protcol -NPO  Anesthesia review: Yes. CAD, HTN, DM, OSA, CHF  Patient denies shortness of breath, fever, cough and chest pain at PAT appointment   All instructions explained to the patient, with a verbal understanding of the material. Patient agrees to go over the instructions while at home for a better understanding. Patient also instructed to self quarantine after being tested for COVID-19. The opportunity to ask questions was provided.

## 2024-03-05 LAB — HEMOGLOBIN A1C
Hgb A1c MFr Bld: 9.1 % — ABNORMAL HIGH (ref 4.8–5.6)
Mean Plasma Glucose: 214 mg/dL

## 2024-03-05 NOTE — Progress Notes (Signed)
 Anesthesia Chart Review:  Case: [id number] Date/Time: 03/13/24 1215   Procedure: ANTERIOR CERVICAL CORPECTOMY - CORPECTOMY C5, C4-6 ANTERIOR PLATING   Anesthesia type: General   Diagnosis: Cervical spondylosis with myelopathy [M47.12]   Pre-op diagnosis: CERVICAL SPONDYLOSIS WITH MYELOPATHY   [facility] ROOM [facility] / [facility]   Surgeons: Van Gelinas, MD       DISCUSSION: Patient is a 63 year old male scheduled for the above procedure.  History includes never smoker, HTN, HLD, CAD (DES mid LAD 05/31/16), chronic diastolic CHF, DM2 (with peripheral neuropathy), CKD, CVA, ectatic TAA (CTA 1022/24: "The thoracic aorta overall has a normal course and caliber with mild atherosclerotic plaque"), dyspnea, PE (06/26/21, 03/25/22), DVT (LLE DVT 04/18/15), anemia, OSA (uses 4L/[place] at night and as needed), hepatic steatosis, pancreatitis, PUD, IBS. BMI is consistent with morbid obesity.  Last cardiology visit up with Dr. Audery Blazing was on 12/15/23 for follow-up CAD, chronic diastolic CHF, chronic dyspnea (multifactorial including chronic diastolic HF, OSA/OHS, PE, morbid obesity), chronic chest pain (brief, non-exertional, non-ischemic stress test in 2023), HTN, mild carotid artery disease.[name] He wrote, "preoperative evaluation prior to neck surgery-he denies exertional chest pain and last nuclear study showed no ischemia. He may proceed without further cardiac evaluation."  He reported last Eliquis  is scheduled for 03/10/24.   Anesthesia team to evaluate on the day of surgery.    VS: BP 130/73   Pulse 71   Temp 36.8 C   Resp 18   Ht 5\' 10"  (1.778 m)   Wt (!) 167.5 kg   SpO2 96%   BMI 52.97 kg/m    PROVIDERS: Valrie Gehrig, MD is PCP. Visit on 02/13/24 for follow-up chronic conditions and preoperative visit. She will consider starting him on GLP1 agonist after surgery.  Alexandria Angel, MD is cardiologist  - Evone Hoh, MD is nephrologist. Visit on 02/26/24 for HTN with previous history of AKI.  Follow-up in 6 months planned with repeat labs. He noted plans for surgery.  - Mannam, Praveen, MD is pulmonologist. Recent CPAP titration study in 09/2023. As of 11/30/23, recommendation for CPAP 16 cmH20, with 2 month office follow-up planned.  Phebe Brasil, MD is neurologist. She noted surgery plans on 12/13/23.    LABS: Labs reviewed: Acceptable for surgery. (all labs ordered are listed, but only abnormal results are displayed)  Labs Reviewed  GLUCOSE, CAPILLARY - Abnormal; Notable for the following components:      Result Value   Glucose-Capillary 145 (*)    All other components within normal limits  BASIC METABOLIC PANEL WITH GFR - Abnormal; Notable for the following components:   Potassium 3.4 (*)    Glucose, Bld 135 (*)    All other components within normal limits  CBC - Abnormal; Notable for the following components:   RBC 4.00 (*)    Hemoglobin 12.7 (*)    HCT 38.5 (*)    All other components within normal limits  SURGICAL PCR SCREEN  HEMOGLOBIN A1C  TYPE AND SCREEN    OTHER: CPAP Titration Study 10/22/23: - HST: 01/2022 showed AHI 5.2 with low sat 81%  IMPRESSIONS - The optimal PAP pressure was 16 cm of water. He slept in a recliner at 30% - Mild oxygen  desaturations were observed during this titration (min O2 = 88.0%). No oxygen  was applied. - The patient snored with moderate snoring volume during this titration study. - No cardiac abnormalities were observed during this study. - Clinically significant periodic limb movements were not noted during this  study. Arousals associated with PLMs were rare.   PFTs 01/24/22: FVC 2.84 (69%), post 2.89 (71%). FEV1 2.23 (70%), post 2.32 (73%). DLCO unc 22. 28 (79%), cor 23.82 (85%).   IMAGES: CTA Chest & CT abd/pelvis 12/09/23: IMPRESSION: 1. No filling defect is identified in the pulmonary arterial tree to suggest pulmonary embolus. 2. Mild cardiomegaly. 3. Two punctate 2 mm right kidney lower pole nonobstructive  renal calculi. 4. Chronic mild left inguinal lymph node enlargement, stable from 01/29/2023 and measuring 1.8 cm on 05/01/2022, significance of this chronic mild nodal enlargement uncertain. 5. Small right scrotal hydrocele. 6. Lower lumbar spondylosis and degenerative disc disease with impingement observed at L4-5 and L5-S1. 7. Small umbilical hernia contains adipose tissues. 8. Bilateral gynecomastia. 9. Aortic atherosclerosis.  CT C-spine 09/14/23: IMPRESSION: 1. No acute osseous abnormality. 2. Moderate to severe spinal stenosis and severe right and moderate left neural foraminal stenosis at C5-6, mildly progressed from 2021. 3. Mild spinal stenosis at C3-4, C4-5, and C6-7.    EKG: 12/09/23: Sinus rhythm Abnormal R-wave progression, late transition Nonspecific T abnormalities, lateral leads Confirmed by Zackowski, Scott [phone]) on 12/09/2023 2:55:45 PM   CV: Echo 03/23/22: IMPRESSIONS   1. Left ventricular ejection fraction, by estimation, is 55 to 60%. The  left ventricle has normal function. The left ventricle has no regional  wall motion abnormalities. Left ventricular diastolic parameters were  normal.   2. Right ventricular systolic function is normal. The right ventricular  size is normal. There is normal pulmonary artery systolic pressure.   3. Left atrial size was mildly dilated.   4. The mitral valve is normal in structure. No evidence of mitral valve  regurgitation. No evidence of mitral stenosis.   5. The aortic valve is tricuspid. Aortic valve regurgitation is not  visualized. No aortic stenosis is present.   6. Aortic dilatation noted. There is mild dilatation of the ascending  aorta, measuring 37 mm.   7. The inferior vena cava is normal in size with greater than 50%  respiratory variability, suggesting right atrial pressure of 3 mmHg.    Nuclear stress test 11/22/21:   The study is normal. The study is low risk.   No ST deviation was noted.   Left  ventricular function is normal. Nuclear stress EF: 51 %. The left ventricular ejection fraction is mildly decreased (45-54%). End diastolic cavity size is mildly enlarged. End systolic cavity size is mildly enlarged.   Prior study available for comparison from 02/03/2016.   Normal stress nuclear study with no ischemia or infarction.  Gated ejection fraction low normal at 51% with normal wall motion.  Mild left ventricular enlargement.   US  Carotid 01/24/18: Final Interpretation:  - Right Carotid: Velocities in the right ICA are consistent with a 1-39% stenosis. Non-hemodynamically significant plaque <50% noted in the CCA. The extracranial vessels were near-normal with only minimal wall thickening or plaque.  - Left Carotid: The extracranial vessels were near-normal with only minimal wall thickening or plaque. Minimum wall thickening noted in the bulb/proximal ICA.  - Vertebrals:  Bilateral vertebral arteries demonstrate antegrade flow.  Small caliber right vertebral artery.  - Subclavians: Normal flow hemodynamics were seen in bilateral subclavian arteries.    LHC 12/08/17: Previously placed Prox LAD to Mid LAD stent (Synergy DES 4.0 mm x 16 mm) is widely patent. Prox RCA lesion is 20% stenosed. The left ventricular systolic function is normal. The left ventricular ejection fraction is 55-65% by visual estimate. LV end diastolic pressure is severely  elevated. 27 mmHg The left ventricular ejection fraction is 55-65% by visual estimate.   Angiographically normal coronary arteries with widely patent LAD stent.  No significant lesions noted. Technically right dominant, but anatomically codominant.   Essential hypertension with elevated LVEDP.  This could potentially be the explanation for his symptoms.  Recommend continued aggressive blood pressure management and diuresis.    Past Medical History:  Diagnosis Date   Anemia    Arthritis    Back pain    CAD (coronary artery disease)    a. s/p  DES to LAD in 05/2016   Cervical radiculopathy    Chronic diastolic CHF (congestive heart failure) (HCC)    Chronic pain    Deep vein thrombosis (HCC) 01/06/2016   Formatting of this note might be different from the original.  Formatting of this note might be different from the original. Last Assessment & Plan: Management per primary care. Last Assessment & Plan: Management per primary care.  Formatting of this note might be different from the original. Last Assessment & Plan: Management per primary care. Last Assessment & Plan: Management per primary care.   Depression    DVT (deep venous thrombosis) (HCC)    Dyspnea    GERD (gastroesophageal reflux disease)    Hematemesis    Hepatic steatosis    Hyperlipidemia    Hypertension    IBS (irritable bowel syndrome)    Morbid obesity (HCC)    Myocardial infarction (HCC)    OSA (obstructive sleep apnea)    oxygen  4L/[place] at night and PRN   Pancreatitis    PE (pulmonary thromboembolism) (HCC)    Peripheral neuropathy    PUD (peptic ulcer disease)    Pulmonary embolism (HCC) 07/15/2021   Renal disorder    Stroke [facility])    a. ?details unclear - not seen on imaging when he was admitted in 05/2017 for TIA symptoms which were felt due to cervical radiculopathy.   Thoracic aortic ectasia (HCC)    a. 4.3cm ectatic ascending thoracic aorta by CT 06/2017.    Type 2 diabetes mellitus (HCC)     Past Surgical History:  Procedure Laterality Date   CARDIAC CATHETERIZATION N/A 05/31/2016   Procedure: Left Heart Cath and Coronary Angiography;  Surgeon: Peter M Swaziland, MD;  Location: [facility] INVASIVE CV LAB;  Service: Cardiovascular;  Laterality: N/A;   CARDIAC CATHETERIZATION N/A 05/31/2016   Procedure: Intravascular Pressure Wire/FFR Study;  Surgeon: Peter M Swaziland, MD;  Location: [facility] INVASIVE CV LAB;  Service: Cardiovascular;  Laterality: N/A;   CARDIAC CATHETERIZATION N/A 05/31/2016   Procedure: Coronary Stent Intervention;  Surgeon: Peter M Swaziland, MD;  Location:  [facility] INVASIVE CV LAB;  Service: Cardiovascular;  Laterality: N/A;   COLONOSCOPY N/A 07/17/2021   Procedure: COLONOSCOPY;  Surgeon: Baldo Bonds, MD;  Location: [facility] ENDOSCOPY;  Service: Endoscopy;  Laterality: N/A;   COLONOSCOPY WITH PROPOFOL  N/A 04/29/2020   Procedure: COLONOSCOPY WITH PROPOFOL ;  Surgeon: Baldo Bonds, MD;  Location: [facility] ENDOSCOPY;  Service: Endoscopy;  Laterality: N/A;   ESOPHAGOGASTRODUODENOSCOPY N/A 04/29/2020   Procedure: ESOPHAGOGASTRODUODENOSCOPY (EGD);  Surgeon: Baldo Bonds, MD;  Location: [facility] ENDOSCOPY;  Service: Endoscopy;  Laterality: N/A;   ESOPHAGOGASTRODUODENOSCOPY (EGD) WITH PROPOFOL  N/A 07/17/2021   Procedure: ESOPHAGOGASTRODUODENOSCOPY (EGD) WITH PROPOFOL ;  Surgeon: Baldo Bonds, MD;  Location: [facility] ENDOSCOPY;  Service: Endoscopy;  Laterality: N/A;   GIVENS CAPSULE STUDY N/A 11/10/2022   Procedure: GIVENS CAPSULE STUDY;  Surgeon: Felecia Hopper, MD;  Location: [facility] ENDOSCOPY;  Service: Gastroenterology;  Laterality: N/A;  LEFT HEART CATH AND CORONARY ANGIOGRAPHY N/A 12/08/2017   Procedure: LEFT HEART CATH AND CORONARY ANGIOGRAPHY;  Surgeon: Arleen Lacer, MD;  Location: [facility] INVASIVE CV LAB;  Service: Cardiovascular;  Laterality: N/A;   LEFT HEART CATHETERIZATION WITH CORONARY ANGIOGRAM N/A 02/03/2014   Procedure: LEFT HEART CATHETERIZATION WITH CORONARY ANGIOGRAM;  Surgeon: Hazle Lites, MD;  Location: [facility] CATH LAB;  Service: Cardiovascular;  Laterality: N/A;   left leg stent      POLYPECTOMY  04/29/2020   Procedure: POLYPECTOMY;  Surgeon: Baldo Bonds, MD;  Location: [facility] ENDOSCOPY;  Service: Endoscopy;;   POLYPECTOMY  07/17/2021   Procedure: POLYPECTOMY;  Surgeon: Baldo Bonds, MD;  Location: [facility] ENDOSCOPY;  Service: Endoscopy;;    MEDICATIONS:  albuterol  (VENTOLIN  HFA) 108 (90 Base) MCG/ACT inhaler   atorvastatin  (LIPITOR ) 80 MG tablet   B Complex-C (B-COMPLEX WITH VITAMIN C) tablet   carvedilol  (COREG ) 6.25 MG tablet   COMBIVENT  RESPIMAT  20-100 MCG/ACT AERS respimat   dorzolamide-timolol  (COSOPT) 2-0.5 % ophthalmic solution   ELIQUIS  5 MG TABS tablet   fenofibrate  (TRICOR ) 145 MG tablet   ferrous sulfate  325 (65 FE) MG tablet   furosemide  (LASIX ) 40 MG tablet   gabapentin  (NEURONTIN ) 800 MG tablet   glipiZIDE  (GLUCOTROL ) 10 MG tablet   HUMULIN  R U-500 KWIKPEN 500 UNIT/ML kwikpen   Insulin  Pen Needle 31G X 5 MM MISC   isosorbide  dinitrate (ISORDIL ) 30 MG tablet   lisinopril  (ZESTRIL ) 20 MG tablet   Menthol , Topical Analgesic, (BIOFREEZE EX)   nitroGLYCERIN  (NITROSTAT ) 0.4 MG SL tablet   ondansetron  (ZOFRAN ) 4 MG tablet   Oxycodone  HCl 20 MG TABS   pantoprazole  (PROTONIX ) 40 MG tablet   potassium chloride  (MICRO-K ) 10 MEQ CR capsule   potassium chloride  SA (KLOR-CON  M) 20 MEQ tablet   sertraline  (ZOLOFT ) 50 MG tablet   SSD 1 % cream   tiZANidine  (ZANAFLEX ) 4 MG tablet   traZODone  (DESYREL ) 50 MG tablet   triamcinolone  cream (KENALOG ) 0.1 %   Vitamin D , Ergocalciferol , (DRISDOL) 1.25 MG (50000 UT) CAPS capsule   No current facility-administered medications for this encounter.   Ella Gun, PA-C Surgical Short Stay/Anesthesiology [facility] Phone [phone] [facility] Phone [phone] 03/05/2024 7:34 PM

## 2024-03-05 NOTE — Anesthesia Preprocedure Evaluation (Addendum)
 Anesthesia Evaluation  Patient identified by MRN, date of birth, ID band Patient awake    Reviewed: Allergy & Precautions, NPO status , Patient's Chart, lab work & pertinent test results  Airway Mallampati: III  TM Distance: >3 FB Neck ROM: Full    Dental  (+) Dental Advisory Given   Pulmonary shortness of breath, sleep apnea and Oxygen  sleep apnea    breath sounds clear to auscultation       Cardiovascular hypertension, Pt. on medications and Pt. on home beta blockers + CAD, + Past MI, + Cardiac Stents, +CHF and + DVT  + dysrhythmias Atrial Fibrillation  Rhythm:Regular Rate:Normal     Neuro/Psych  Headaches TIA Neuromuscular disease CVA    GI/Hepatic Neg liver ROS, PUD,GERD  ,,  Endo/Other  diabetes, Poorly Controlled, Type 2, Oral Hypoglycemic Agents, Insulin  Dependent    Renal/GU Renal disease     Musculoskeletal  (+) Arthritis ,    Abdominal   Peds  Hematology  (+) Blood dyscrasia, anemia   Anesthesia Other Findings   Reproductive/Obstetrics                             Anesthesia Physical Anesthesia Plan  ASA: 3  Anesthesia Plan: General   Post-op Pain Management: Ofirmev  IV (intra-op)*   Induction: Intravenous  PONV Risk Score and Plan: 2 and Dexamethasone , Ondansetron , Midazolam  and Treatment may vary due to age or medical condition  Airway Management Planned: Oral ETT and Video Laryngoscope Planned  Additional Equipment: Arterial line  Intra-op Plan:   Post-operative Plan: Extubation in OR  Informed Consent: I have reviewed the patients History and Physical, chart, labs and discussed the procedure including the risks, benefits and alternatives for the proposed anesthesia with the patient or authorized representative who has indicated his/her understanding and acceptance.     Dental advisory given  Plan Discussed with:   Anesthesia Plan Comments: (PAT note written  03/05/2024 by Ella Gun, PA-C.  )       Anesthesia Quick Evaluation

## 2024-03-11 ENCOUNTER — Ambulatory Visit: Admitting: Adult Health

## 2024-03-13 ENCOUNTER — Encounter (HOSPITAL_COMMUNITY): Payer: Self-pay | Admitting: Neurosurgery

## 2024-03-13 ENCOUNTER — Inpatient Hospital Stay (HOSPITAL_COMMUNITY)
Admission: RE | Admit: 2024-03-13 | Discharge: 2024-03-16 | DRG: 472 | Disposition: A | Discharge status: Home Health | Attending: Neurosurgery | Admitting: Neurosurgery

## 2024-03-13 ENCOUNTER — Inpatient Hospital Stay (HOSPITAL_COMMUNITY)
Admission: RE | Disposition: A | Discharge status: Home Health | Payer: Self-pay | Source: Home / Self Care | Attending: Neurosurgery

## 2024-03-13 ENCOUNTER — Inpatient Hospital Stay (HOSPITAL_COMMUNITY)

## 2024-03-13 ENCOUNTER — Other Ambulatory Visit: Payer: Self-pay

## 2024-03-13 ENCOUNTER — Inpatient Hospital Stay (HOSPITAL_COMMUNITY): Payer: Self-pay | Admitting: Vascular Surgery

## 2024-03-13 DIAGNOSIS — E1142 Type 2 diabetes mellitus with diabetic polyneuropathy: Secondary | ICD-10-CM | POA: Diagnosis present

## 2024-03-13 DIAGNOSIS — G9589 Other specified diseases of spinal cord: Secondary | ICD-10-CM | POA: Diagnosis present

## 2024-03-13 DIAGNOSIS — E1122 Type 2 diabetes mellitus with diabetic chronic kidney disease: Secondary | ICD-10-CM | POA: Diagnosis present

## 2024-03-13 DIAGNOSIS — K760 Fatty (change of) liver, not elsewhere classified: Secondary | ICD-10-CM | POA: Diagnosis present

## 2024-03-13 DIAGNOSIS — E1169 Type 2 diabetes mellitus with other specified complication: Secondary | ICD-10-CM | POA: Diagnosis present

## 2024-03-13 DIAGNOSIS — E782 Mixed hyperlipidemia: Secondary | ICD-10-CM | POA: Diagnosis present

## 2024-03-13 DIAGNOSIS — Z886 Allergy status to analgesic agent status: Secondary | ICD-10-CM

## 2024-03-13 DIAGNOSIS — I252 Old myocardial infarction: Secondary | ICD-10-CM | POA: Diagnosis not present

## 2024-03-13 DIAGNOSIS — I480 Paroxysmal atrial fibrillation: Secondary | ICD-10-CM | POA: Diagnosis present

## 2024-03-13 DIAGNOSIS — Z86711 Personal history of pulmonary embolism: Secondary | ICD-10-CM

## 2024-03-13 DIAGNOSIS — Z833 Family history of diabetes mellitus: Secondary | ICD-10-CM | POA: Diagnosis not present

## 2024-03-13 DIAGNOSIS — Z888 Allergy status to other drugs, medicaments and biological substances status: Secondary | ICD-10-CM

## 2024-03-13 DIAGNOSIS — G959 Disease of spinal cord, unspecified: Principal | ICD-10-CM | POA: Diagnosis present

## 2024-03-13 DIAGNOSIS — J9611 Chronic respiratory failure with hypoxia: Secondary | ICD-10-CM | POA: Diagnosis present

## 2024-03-13 DIAGNOSIS — I110 Hypertensive heart disease with heart failure: Secondary | ICD-10-CM | POA: Diagnosis present

## 2024-03-13 DIAGNOSIS — Z79899 Other long term (current) drug therapy: Secondary | ICD-10-CM

## 2024-03-13 DIAGNOSIS — Z955 Presence of coronary angioplasty implant and graft: Secondary | ICD-10-CM

## 2024-03-13 DIAGNOSIS — I5032 Chronic diastolic (congestive) heart failure: Secondary | ICD-10-CM | POA: Diagnosis present

## 2024-03-13 DIAGNOSIS — Z8249 Family history of ischemic heart disease and other diseases of the circulatory system: Secondary | ICD-10-CM

## 2024-03-13 DIAGNOSIS — Z7901 Long term (current) use of anticoagulants: Secondary | ICD-10-CM | POA: Diagnosis not present

## 2024-03-13 DIAGNOSIS — M4802 Spinal stenosis, cervical region: Secondary | ICD-10-CM | POA: Diagnosis present

## 2024-03-13 DIAGNOSIS — Z91018 Allergy to other foods: Secondary | ICD-10-CM

## 2024-03-13 DIAGNOSIS — Z794 Long term (current) use of insulin: Secondary | ICD-10-CM | POA: Diagnosis not present

## 2024-03-13 DIAGNOSIS — Z8673 Personal history of transient ischemic attack (TIA), and cerebral infarction without residual deficits: Secondary | ICD-10-CM

## 2024-03-13 DIAGNOSIS — Z6841 Body Mass Index (BMI) 40.0 and over, adult: Secondary | ICD-10-CM

## 2024-03-13 DIAGNOSIS — I2510 Atherosclerotic heart disease of native coronary artery without angina pectoris: Secondary | ICD-10-CM

## 2024-03-13 DIAGNOSIS — E1165 Type 2 diabetes mellitus with hyperglycemia: Secondary | ICD-10-CM | POA: Diagnosis present

## 2024-03-13 DIAGNOSIS — Z86718 Personal history of other venous thrombosis and embolism: Secondary | ICD-10-CM

## 2024-03-13 DIAGNOSIS — Z803 Family history of malignant neoplasm of breast: Secondary | ICD-10-CM

## 2024-03-13 DIAGNOSIS — Z7984 Long term (current) use of oral hypoglycemic drugs: Secondary | ICD-10-CM

## 2024-03-13 DIAGNOSIS — K219 Gastro-esophageal reflux disease without esophagitis: Secondary | ICD-10-CM | POA: Diagnosis present

## 2024-03-13 DIAGNOSIS — M4712 Other spondylosis with myelopathy, cervical region: Secondary | ICD-10-CM | POA: Diagnosis present

## 2024-03-13 DIAGNOSIS — E559 Vitamin D deficiency, unspecified: Secondary | ICD-10-CM | POA: Diagnosis present

## 2024-03-13 DIAGNOSIS — G4733 Obstructive sleep apnea (adult) (pediatric): Secondary | ICD-10-CM | POA: Diagnosis present

## 2024-03-13 HISTORY — PX: ANTERIOR CERVICAL CORPECTOMY: SHX1159

## 2024-03-13 LAB — POCT I-STAT 7, (LYTES, BLD GAS, ICA,H+H)
Acid-Base Excess: 5 mmol/L — ABNORMAL HIGH (ref 0.0–2.0)
Acid-Base Excess: 5 mmol/L — ABNORMAL HIGH (ref 0.0–2.0)
Bicarbonate: 31.1 mmol/L — ABNORMAL HIGH (ref 20.0–28.0)
Bicarbonate: 30.7 mmol/L — ABNORMAL HIGH (ref 20.0–28.0)
Calcium, Ion: 1.16 mmol/L (ref 1.15–1.40)
Calcium, Ion: 1.13 mmol/L — ABNORMAL LOW (ref 1.15–1.40)
HCT: 36 % — ABNORMAL LOW (ref 39.0–52.0)
HCT: 35 % — ABNORMAL LOW (ref 39.0–52.0)
Hemoglobin: 12.2 g/dL — ABNORMAL LOW (ref 13.0–17.0)
Hemoglobin: 11.9 g/dL — ABNORMAL LOW (ref 13.0–17.0)
O2 Saturation: 100 %
O2 Saturation: 99 %
Potassium: 4.4 mmol/L (ref 3.5–5.1)
Potassium: 4.4 mmol/L (ref 3.5–5.1)
Sodium: 139 mmol/L (ref 135–145)
Sodium: 138 mmol/L (ref 135–145)
TCO2: 33 mmol/L — ABNORMAL HIGH (ref 22–32)
TCO2: 32 mmol/L (ref 22–32)
pCO2 arterial: 52.3 mmHg — ABNORMAL HIGH (ref 32–48)
pCO2 arterial: 52.1 mmHg — ABNORMAL HIGH (ref 32–48)
pH, Arterial: 7.383 (ref 7.35–7.45)
pH, Arterial: 7.379 (ref 7.35–7.45)
pO2, Arterial: 237 mmHg — ABNORMAL HIGH (ref 83–108)
pO2, Arterial: 144 mmHg — ABNORMAL HIGH (ref 83–108)

## 2024-03-13 LAB — GLUCOSE, CAPILLARY
Glucose-Capillary: 219 mg/dL — ABNORMAL HIGH (ref 70–99)
Glucose-Capillary: 154 mg/dL — ABNORMAL HIGH (ref 70–99)
Glucose-Capillary: 203 mg/dL — ABNORMAL HIGH (ref 70–99)
Glucose-Capillary: 247 mg/dL — ABNORMAL HIGH (ref 70–99)
Glucose-Capillary: 208 mg/dL — ABNORMAL HIGH (ref 70–99)

## 2024-03-13 SURGERY — ANTERIOR CERVICAL CORPECTOMY
Anesthesia: General

## 2024-03-13 MED ORDER — CARVEDILOL 6.25 MG PO TABS
6.2500 mg | ORAL_TABLET | Freq: Two times a day (BID) | ORAL | Status: DC
Start: 2024-03-13 — End: 2024-03-16
  Administered 2024-03-13 – 2024-03-16 (×6): 6.25 mg via ORAL
  Filled 2024-03-13 (×6): qty 1

## 2024-03-13 MED ORDER — PROPOFOL 10 MG/ML IV BOLUS
INTRAVENOUS | Status: DC | PRN
  Administered 2024-03-13: 60 mg via INTRAVENOUS
  Administered 2024-03-13: 200 mg via INTRAVENOUS

## 2024-03-13 MED ORDER — PHENOL 1.4 % MT LIQD: 1.0000 | OROMUCOSAL | Status: DC | PRN

## 2024-03-13 MED ORDER — CEFAZOLIN SODIUM-DEXTROSE 2-4 GM/100ML-% IV SOLN
2.0000 g | Freq: Four times a day (QID) | INTRAVENOUS | Status: AC
  Administered 2024-03-13 – 2024-03-14 (×2): 2 g via INTRAVENOUS
  Filled 2024-03-13 (×2): qty 100

## 2024-03-13 MED ORDER — DEXAMETHASONE SODIUM PHOSPHATE 10 MG/ML IJ SOLN
INTRAMUSCULAR | Status: AC
  Filled 2024-03-13: qty 1

## 2024-03-13 MED ORDER — ATORVASTATIN CALCIUM 80 MG PO TABS
80.0000 mg | ORAL_TABLET | Freq: Every day | ORAL | Status: DC
  Administered 2024-03-13 – 2024-03-16 (×4): 80 mg via ORAL
  Filled 2024-03-13 (×4): qty 1

## 2024-03-13 MED ORDER — ONDANSETRON HCL 4 MG/2ML IJ SOLN: 4.0000 mg | Freq: Four times a day (QID) | INTRAMUSCULAR | Status: DC | PRN

## 2024-03-13 MED ORDER — CHLORHEXIDINE GLUCONATE CLOTH 2 % EX PADS: 6.0000 | MEDICATED_PAD | Freq: Once | CUTANEOUS | Status: DC

## 2024-03-13 MED ORDER — METHOCARBAMOL 500 MG PO TABS
500.0000 mg | ORAL_TABLET | Freq: Four times a day (QID) | ORAL | Status: DC | PRN
  Administered 2024-03-13 – 2024-03-15 (×4): 500 mg via ORAL
  Filled 2024-03-13 (×4): qty 1

## 2024-03-13 MED ORDER — POTASSIUM CHLORIDE CRYS ER 20 MEQ PO TBCR
20.0000 meq | EXTENDED_RELEASE_TABLET | Freq: Two times a day (BID) | ORAL | Status: DC
  Administered 2024-03-13 – 2024-03-16 (×6): 20 meq via ORAL
  Filled 2024-03-13 (×6): qty 1

## 2024-03-13 MED ORDER — PANTOPRAZOLE SODIUM 40 MG PO TBEC
40.0000 mg | DELAYED_RELEASE_TABLET | Freq: Every day | ORAL | Status: DC
  Administered 2024-03-13 – 2024-03-16 (×4): 40 mg via ORAL
  Filled 2024-03-13 (×5): qty 1

## 2024-03-13 MED ORDER — FUROSEMIDE 40 MG PO TABS
80.0000 mg | ORAL_TABLET | Freq: Every day | ORAL | Status: DC
  Administered 2024-03-14 – 2024-03-16 (×3): 80 mg via ORAL
  Filled 2024-03-13 (×3): qty 2

## 2024-03-13 MED ORDER — ONDANSETRON HCL 4 MG/2ML IJ SOLN
INTRAMUSCULAR | Status: DC | PRN
  Administered 2024-03-13: 4 mg via INTRAVENOUS

## 2024-03-13 MED ORDER — PROPOFOL 10 MG/ML IV BOLUS
INTRAVENOUS | Status: AC
  Filled 2024-03-13: qty 20

## 2024-03-13 MED ORDER — FENTANYL CITRATE (PF) 100 MCG/2ML IJ SOLN
25.0000 ug | INTRAMUSCULAR | Status: DC | PRN
  Administered 2024-03-13 (×2): 50 ug via INTRAVENOUS

## 2024-03-13 MED ORDER — ACETAMINOPHEN 325 MG PO TABS
650.0000 mg | ORAL_TABLET | ORAL | Status: DC | PRN
  Filled 2024-03-13: qty 2

## 2024-03-13 MED ORDER — SODIUM CHLORIDE 0.9% FLUSH: 3.0000 mL | INTRAVENOUS | Status: DC | PRN

## 2024-03-13 MED ORDER — OXYCODONE HCL 5 MG PO TABS
10.0000 mg | ORAL_TABLET | ORAL | Status: DC | PRN
  Administered 2024-03-14 – 2024-03-15 (×6): 10 mg via ORAL
  Filled 2024-03-13 (×7): qty 2

## 2024-03-13 MED ORDER — PHENYLEPHRINE 80 MCG/ML (10ML) SYRINGE FOR IV PUSH (FOR BLOOD PRESSURE SUPPORT)
PREFILLED_SYRINGE | INTRAVENOUS | Status: DC | PRN
  Administered 2024-03-13: 120 ug via INTRAVENOUS
  Administered 2024-03-13: 80 ug via INTRAVENOUS

## 2024-03-13 MED ORDER — CHLORHEXIDINE GLUCONATE 0.12 % MT SOLN
15.0000 mL | Freq: Once | OROMUCOSAL | Status: AC
  Administered 2024-03-13: 15 mL via OROMUCOSAL
  Filled 2024-03-13: qty 15

## 2024-03-13 MED ORDER — CEFAZOLIN SODIUM-DEXTROSE 3-4 GM/150ML-% IV SOLN
3.0000 g | INTRAVENOUS | Status: AC
  Administered 2024-03-13 (×2): 3 g via INTRAVENOUS
  Filled 2024-03-13: qty 150

## 2024-03-13 MED ORDER — INSULIN ASPART 100 UNIT/ML IJ SOLN
0.0000 [IU] | Freq: Every day | INTRAMUSCULAR | Status: DC
  Administered 2024-03-13: 2 [IU] via SUBCUTANEOUS
  Administered 2024-03-14: 3 [IU] via SUBCUTANEOUS

## 2024-03-13 MED ORDER — ACETAMINOPHEN 10 MG/ML IV SOLN
INTRAVENOUS | Status: AC
  Filled 2024-03-13: qty 100

## 2024-03-13 MED ORDER — EPHEDRINE SULFATE-NACL 50-0.9 MG/10ML-% IV SOSY
PREFILLED_SYRINGE | INTRAVENOUS | Status: DC | PRN
  Administered 2024-03-13: 5 mg via INTRAVENOUS

## 2024-03-13 MED ORDER — HYDROMORPHONE HCL 1 MG/ML IJ SOLN
0.5000 mg | INTRAMUSCULAR | Status: DC | PRN
  Administered 2024-03-13 – 2024-03-16 (×9): 0.5 mg via INTRAVENOUS
  Filled 2024-03-13 (×9): qty 0.5

## 2024-03-13 MED ORDER — MIDAZOLAM HCL 2 MG/2ML IJ SOLN
INTRAMUSCULAR | Status: AC
Start: 2024-03-13 — End: ?
  Filled 2024-03-13: qty 2

## 2024-03-13 MED ORDER — CARVEDILOL 3.125 MG PO TABS
6.2500 mg | ORAL_TABLET | Freq: Once | ORAL | Status: AC
  Administered 2024-03-13: 6.25 mg via ORAL

## 2024-03-13 MED ORDER — TRAZODONE HCL 50 MG PO TABS
50.0000 mg | ORAL_TABLET | Freq: Every day | ORAL | Status: DC
  Administered 2024-03-13 – 2024-03-15 (×3): 50 mg via ORAL
  Filled 2024-03-13 (×3): qty 1

## 2024-03-13 MED ORDER — GABAPENTIN 400 MG PO CAPS
800.0000 mg | ORAL_CAPSULE | Freq: Three times a day (TID) | ORAL | Status: DC
Start: 2024-03-13 — End: 2024-03-16
  Administered 2024-03-13 – 2024-03-16 (×8): 800 mg via ORAL
  Filled 2024-03-13 (×8): qty 2

## 2024-03-13 MED ORDER — THROMBIN 5000 UNITS EX KIT
PACK | CUTANEOUS | Status: AC
  Filled 2024-03-13: qty 3

## 2024-03-13 MED ORDER — MIDAZOLAM HCL 2 MG/2ML IJ SOLN
INTRAMUSCULAR | Status: DC | PRN
  Administered 2024-03-13: 2 mg via INTRAVENOUS

## 2024-03-13 MED ORDER — INSULIN ASPART 100 UNIT/ML IJ SOLN
0.0000 [IU] | INTRAMUSCULAR | Status: AC | PRN
  Administered 2024-03-13 (×2): 4 [IU] via SUBCUTANEOUS
  Filled 2024-03-13 (×2): qty 1

## 2024-03-13 MED ORDER — INSULIN ASPART 100 UNIT/ML IJ SOLN
0.0000 [IU] | Freq: Three times a day (TID) | INTRAMUSCULAR | Status: DC
  Administered 2024-03-14: 7 [IU] via SUBCUTANEOUS
  Administered 2024-03-14: 11 [IU] via SUBCUTANEOUS
  Administered 2024-03-14: 7 [IU] via SUBCUTANEOUS
  Administered 2024-03-15 (×2): 4 [IU] via SUBCUTANEOUS
  Administered 2024-03-15: 7 [IU] via SUBCUTANEOUS
  Administered 2024-03-16 (×2): 3 [IU] via SUBCUTANEOUS

## 2024-03-13 MED ORDER — LACTATED RINGERS IV SOLN
INTRAVENOUS | Status: DC | PRN
Start: 2024-03-13 — End: 2024-03-13

## 2024-03-13 MED ORDER — ALBUMIN HUMAN 5 % IV SOLN: INTRAVENOUS | Status: DC | PRN

## 2024-03-13 MED ORDER — FENTANYL CITRATE (PF) 100 MCG/2ML IJ SOLN
INTRAMUSCULAR | Status: AC
  Filled 2024-03-13: qty 2

## 2024-03-13 MED ORDER — IPRATROPIUM-ALBUTEROL 0.5-2.5 (3) MG/3ML IN SOLN
3.0000 mL | Freq: Four times a day (QID) | RESPIRATORY_TRACT | Status: DC
  Administered 2024-03-13 – 2024-03-14 (×2): 3 mL via RESPIRATORY_TRACT
  Filled 2024-03-13 (×2): qty 3

## 2024-03-13 MED ORDER — MENTHOL 3 MG MT LOZG: 1.0000 | LOZENGE | OROMUCOSAL | Status: DC | PRN

## 2024-03-13 MED ORDER — DOCUSATE SODIUM 100 MG PO CAPS
100.0000 mg | ORAL_CAPSULE | Freq: Two times a day (BID) | ORAL | Status: DC
  Administered 2024-03-13 – 2024-03-16 (×6): 100 mg via ORAL
  Filled 2024-03-13 (×6): qty 1

## 2024-03-13 MED ORDER — FERROUS SULFATE 325 (65 FE) MG PO TABS
325.0000 mg | ORAL_TABLET | Freq: Every day | ORAL | Status: DC
  Administered 2024-03-13 – 2024-03-16 (×4): 325 mg via ORAL
  Filled 2024-03-13 (×4): qty 1

## 2024-03-13 MED ORDER — ISOSORBIDE DINITRATE 30 MG PO TABS
30.0000 mg | ORAL_TABLET | Freq: Two times a day (BID) | ORAL | Status: DC
  Administered 2024-03-13 – 2024-03-16 (×6): 30 mg via ORAL
  Filled 2024-03-13 (×7): qty 1

## 2024-03-13 MED ORDER — NITROGLYCERIN 0.4 MG SL SUBL: 0.4000 mg | SUBLINGUAL_TABLET | SUBLINGUAL | Status: DC | PRN

## 2024-03-13 MED ORDER — OXYCODONE HCL 5 MG PO TABS
5.0000 mg | ORAL_TABLET | ORAL | Status: DC | PRN
  Administered 2024-03-13: 5 mg via ORAL
  Filled 2024-03-13: qty 1

## 2024-03-13 MED ORDER — FLEET ENEMA RE ENEM: 1.0000 | ENEMA | Freq: Once | RECTAL | Status: DC | PRN

## 2024-03-13 MED ORDER — ONDANSETRON HCL 4 MG/2ML IJ SOLN
INTRAMUSCULAR | Status: AC
Start: 2024-03-13 — End: ?
  Filled 2024-03-13: qty 2

## 2024-03-13 MED ORDER — ALBUTEROL SULFATE (2.5 MG/3ML) 0.083% IN NEBU: 2.5000 mg | INHALATION_SOLUTION | Freq: Four times a day (QID) | RESPIRATORY_TRACT | Status: DC | PRN

## 2024-03-13 MED ORDER — LIDOCAINE 2% (20 MG/ML) 5 ML SYRINGE
INTRAMUSCULAR | Status: DC | PRN
  Administered 2024-03-13: 60 mg via INTRAVENOUS

## 2024-03-13 MED ORDER — GLIPIZIDE 5 MG PO TABS
10.0000 mg | ORAL_TABLET | Freq: Two times a day (BID) | ORAL | Status: DC
  Administered 2024-03-14 – 2024-03-16 (×5): 10 mg via ORAL
  Filled 2024-03-13 (×5): qty 2

## 2024-03-13 MED ORDER — IPRATROPIUM-ALBUTEROL 20-100 MCG/ACT IN AERS: 1.0000 | INHALATION_SPRAY | Freq: Four times a day (QID) | RESPIRATORY_TRACT | Status: DC

## 2024-03-13 MED ORDER — LIDOCAINE 2% (20 MG/ML) 5 ML SYRINGE
INTRAMUSCULAR | Status: AC
  Filled 2024-03-13: qty 5

## 2024-03-13 MED ORDER — ROCURONIUM BROMIDE 10 MG/ML (PF) SYRINGE
PREFILLED_SYRINGE | INTRAVENOUS | Status: AC
  Filled 2024-03-13: qty 10

## 2024-03-13 MED ORDER — LABETALOL HCL 5 MG/ML IV SOLN
INTRAVENOUS | Status: DC | PRN
  Administered 2024-03-13: 5 mg via INTRAVENOUS

## 2024-03-13 MED ORDER — THROMBIN 5000 UNITS EX SOLR
OROMUCOSAL | Status: DC | PRN
  Administered 2024-03-13: 5 mL via TOPICAL

## 2024-03-13 MED ORDER — BUPIVACAINE HCL 0.5 % IJ SOLN
INTRAMUSCULAR | Status: DC | PRN
  Administered 2024-03-13: 3 mL

## 2024-03-13 MED ORDER — FENTANYL CITRATE (PF) 250 MCG/5ML IJ SOLN
INTRAMUSCULAR | Status: AC
  Filled 2024-03-13: qty 5

## 2024-03-13 MED ORDER — DORZOLAMIDE HCL-TIMOLOL MAL 2-0.5 % OP SOLN
1.0000 [drp] | Freq: Two times a day (BID) | OPHTHALMIC | Status: DC
Start: 2024-03-13 — End: 2024-03-16
  Administered 2024-03-16: 1 [drp] via OPHTHALMIC
  Filled 2024-03-13: qty 10

## 2024-03-13 MED ORDER — LIDOCAINE-EPINEPHRINE 1 %-1:100000 IJ SOLN
INTRAMUSCULAR | Status: DC | PRN
  Administered 2024-03-13: 3 mL

## 2024-03-13 MED ORDER — POLYETHYLENE GLYCOL 3350 17 G PO PACK: 17.0000 g | PACK | Freq: Every day | ORAL | Status: DC | PRN

## 2024-03-13 MED ORDER — FUROSEMIDE 40 MG PO TABS
40.0000 mg | ORAL_TABLET | Freq: Every day | ORAL | Status: DC
  Administered 2024-03-14 – 2024-03-15 (×2): 40 mg via ORAL
  Filled 2024-03-13 (×2): qty 1

## 2024-03-13 MED ORDER — LIDOCAINE-EPINEPHRINE 1 %-1:100000 IJ SOLN
INTRAMUSCULAR | Status: AC
  Filled 2024-03-13: qty 1

## 2024-03-13 MED ORDER — METHOCARBAMOL 1000 MG/10ML IJ SOLN: 500.0000 mg | Freq: Four times a day (QID) | INTRAMUSCULAR | Status: DC | PRN

## 2024-03-13 MED ORDER — ACETAMINOPHEN 650 MG RE SUPP: 650.0000 mg | RECTAL | Status: DC | PRN

## 2024-03-13 MED ORDER — THROMBIN (RECOMBINANT) 5000 UNITS EX SOLR
CUTANEOUS | Status: DC | PRN
  Administered 2024-03-13: 10 mL via TOPICAL

## 2024-03-13 MED ORDER — SERTRALINE HCL 50 MG PO TABS
50.0000 mg | ORAL_TABLET | Freq: Every morning | ORAL | Status: DC
  Administered 2024-03-14 – 2024-03-16 (×3): 50 mg via ORAL
  Filled 2024-03-13 (×3): qty 1

## 2024-03-13 MED ORDER — SODIUM CHLORIDE 0.9 % IV SOLN
250.0000 mL | INTRAVENOUS | Status: AC
  Administered 2024-03-13: 250 mL via INTRAVENOUS

## 2024-03-13 MED ORDER — SODIUM CHLORIDE 0.9% FLUSH
3.0000 mL | Freq: Two times a day (BID) | INTRAVENOUS | Status: DC
  Administered 2024-03-13 – 2024-03-16 (×6): 3 mL via INTRAVENOUS

## 2024-03-13 MED ORDER — 0.9 % SODIUM CHLORIDE (POUR BTL) OPTIME
TOPICAL | Status: DC | PRN
  Administered 2024-03-13: 1000 mL

## 2024-03-13 MED ORDER — SUCCINYLCHOLINE CHLORIDE 200 MG/10ML IV SOSY
PREFILLED_SYRINGE | INTRAVENOUS | Status: DC | PRN
  Administered 2024-03-13: 160 mg via INTRAVENOUS

## 2024-03-13 MED ORDER — ESMOLOL HCL 100 MG/10ML IV SOLN
INTRAVENOUS | Status: DC | PRN
  Administered 2024-03-13: 50 mg via INTRAVENOUS

## 2024-03-13 MED ORDER — CARVEDILOL 3.125 MG PO TABS
ORAL_TABLET | ORAL | Status: AC
  Filled 2024-03-13: qty 2

## 2024-03-13 MED ORDER — ACETAMINOPHEN 10 MG/ML IV SOLN
INTRAVENOUS | Status: DC | PRN
  Administered 2024-03-13: 1000 mg via INTRAVENOUS

## 2024-03-13 MED ORDER — ALBUTEROL SULFATE HFA 108 (90 BASE) MCG/ACT IN AERS: 2.0000 | INHALATION_SPRAY | Freq: Four times a day (QID) | RESPIRATORY_TRACT | Status: DC | PRN

## 2024-03-13 MED ORDER — ROCURONIUM BROMIDE 10 MG/ML (PF) SYRINGE
PREFILLED_SYRINGE | INTRAVENOUS | Status: DC | PRN
  Administered 2024-03-13: 20 mg via INTRAVENOUS
  Administered 2024-03-13: 10 mg via INTRAVENOUS
  Administered 2024-03-13: 20 mg via INTRAVENOUS
  Administered 2024-03-13: 10 mg via INTRAVENOUS
  Administered 2024-03-13: 60 mg via INTRAVENOUS
  Administered 2024-03-13: 20 mg via INTRAVENOUS
  Administered 2024-03-13: 40 mg via INTRAVENOUS

## 2024-03-13 MED ORDER — PHENYLEPHRINE HCL-NACL 20-0.9 MG/250ML-% IV SOLN
INTRAVENOUS | Status: DC | PRN
  Administered 2024-03-13: 20 ug/min via INTRAVENOUS

## 2024-03-13 MED ORDER — SUGAMMADEX SODIUM 200 MG/2ML IV SOLN
INTRAVENOUS | Status: DC | PRN
  Administered 2024-03-13: 200 mg via INTRAVENOUS

## 2024-03-13 MED ORDER — BUPIVACAINE HCL (PF) 0.5 % IJ SOLN
INTRAMUSCULAR | Status: AC
  Filled 2024-03-13: qty 30

## 2024-03-13 MED ORDER — FENTANYL CITRATE (PF) 250 MCG/5ML IJ SOLN
INTRAMUSCULAR | Status: DC | PRN
  Administered 2024-03-13: 50 ug via INTRAVENOUS
  Administered 2024-03-13: 100 ug via INTRAVENOUS
  Administered 2024-03-13 (×2): 50 ug via INTRAVENOUS

## 2024-03-13 MED ORDER — AMISULPRIDE (ANTIEMETIC) 5 MG/2ML IV SOLN: 10.0000 mg | Freq: Once | INTRAVENOUS | Status: DC | PRN

## 2024-03-13 MED ORDER — LISINOPRIL 20 MG PO TABS
20.0000 mg | ORAL_TABLET | Freq: Every day | ORAL | Status: DC
  Administered 2024-03-13 – 2024-03-16 (×4): 20 mg via ORAL
  Filled 2024-03-13 (×4): qty 1

## 2024-03-13 MED ORDER — ORAL CARE MOUTH RINSE: 15.0000 mL | Freq: Once | OROMUCOSAL | Status: AC

## 2024-03-13 MED ORDER — SODIUM CHLORIDE 0.9 % IV SOLN
INTRAVENOUS | Status: DC | PRN
Start: 2024-03-13 — End: 2024-03-13

## 2024-03-13 MED ORDER — ONDANSETRON HCL 4 MG PO TABS: 4.0000 mg | ORAL_TABLET | Freq: Four times a day (QID) | ORAL | Status: DC | PRN

## 2024-03-13 SURGICAL SUPPLY — 66 items
BAG COUNTER SPONGE SURGICOUNT (BAG) ×1 IMPLANT
BAND RUBBER #18 3X1/16 STRL (MISCELLANEOUS) ×2 IMPLANT
BASKET BONE COLLECTION (BASKET) IMPLANT
BENZOIN TINCTURE PRP APPL 2/3 (GAUZE/BANDAGES/DRESSINGS) ×1 IMPLANT
BIT DRILL 13 (BIT) IMPLANT
BIT DRILL NEURO 2X3.1 SFT TUCH (MISCELLANEOUS) ×1 IMPLANT
BLADE CLIPPER SURG (BLADE) IMPLANT
BUR MATCHSTICK NEURO 3.0 LAGG (BURR) ×1 IMPLANT
BUR RND DIAMOND ELITE 4.0 (BURR) IMPLANT
CANISTER SUCTION 3000ML PPV (SUCTIONS) ×1 IMPLANT
CLSR STERI-STRIP ANTIMIC 1/2X4 (GAUZE/BANDAGES/DRESSINGS) IMPLANT
DEVICE ENDSKLTN IMPL 16X14X7X6 (Cage) IMPLANT
DRAPE C-ARM 42X72 X-RAY (DRAPES) ×2 IMPLANT
DRAPE HALF SHEET 40X57 (DRAPES) IMPLANT
DRAPE LAPAROTOMY 100X72 PEDS (DRAPES) ×1 IMPLANT
DRAPE MICROSCOPE SLANT 54X150 (MISCELLANEOUS) ×1 IMPLANT
DRESSING MEPILEX FLEX 4X4 (GAUZE/BANDAGES/DRESSINGS) ×1 IMPLANT
DRSG OPSITE 4X5.5 SM (GAUZE/BANDAGES/DRESSINGS) ×2 IMPLANT
DRSG OPSITE POSTOP 4X6 (GAUZE/BANDAGES/DRESSINGS) IMPLANT
DURAPREP 26ML APPLICATOR (WOUND CARE) ×1 IMPLANT
ELECT COATED BLADE 2.86 ST (ELECTRODE) ×1 IMPLANT
ELECTRODE BLADE INSULATED 4IN (ELECTROSURGICAL) IMPLANT
ELECTRODE REM PT RTRN 9FT ADLT (ELECTROSURGICAL) ×1 IMPLANT
EVACUATOR 1/8 PVC DRAIN (DRAIN) IMPLANT
GAUZE 4X4 16PLY ~~LOC~~+RFID DBL (SPONGE) IMPLANT
GLOVE BIO SURGEON STRL SZ7 (GLOVE) ×2 IMPLANT
GLOVE BIOGEL PI IND STRL 7.5 (GLOVE) IMPLANT
GLOVE BIOGEL PI IND STRL 8 (GLOVE) ×2 IMPLANT
GLOVE ECLIPSE 8.0 STRL XLNG CF (GLOVE) ×1 IMPLANT
GOWN STRL REUS W/ TWL LRG LVL3 (GOWN DISPOSABLE) ×2 IMPLANT
GOWN STRL REUS W/ TWL XL LVL3 (GOWN DISPOSABLE) ×1 IMPLANT
GOWN STRL REUS W/TWL 2XL LVL3 (GOWN DISPOSABLE) IMPLANT
GOWN STRL SURGICAL XL XLNG (GOWN DISPOSABLE) IMPLANT
HEMOSTAT POWDER KIT SURGIFOAM (HEMOSTASIS) ×1 IMPLANT
KIT BASIN OR (CUSTOM PROCEDURE TRAY) ×1 IMPLANT
KIT TURNOVER KIT B (KITS) ×1 IMPLANT
NDL HYPO 22X1.5 SAFETY MO (MISCELLANEOUS) ×1 IMPLANT
NDL SPNL 22GX3.5 QUINCKE BK (NEEDLE) ×1 IMPLANT
NEEDLE HYPO 22X1.5 SAFETY MO (MISCELLANEOUS) ×1 IMPLANT
NEEDLE SPNL 22GX3.5 QUINCKE BK (NEEDLE) ×1 IMPLANT
NS IRRIG 1000ML POUR BTL (IV SOLUTION) ×1 IMPLANT
PACK LAMINECTOMY NEURO (CUSTOM PROCEDURE TRAY) ×1 IMPLANT
PAD ARMBOARD POSITIONER FOAM (MISCELLANEOUS) ×3 IMPLANT
PATTIES SURGICAL .5 X.5 (GAUZE/BANDAGES/DRESSINGS) ×1 IMPLANT
PATTIES SURGICAL 1X1 (DISPOSABLE) ×1 IMPLANT
PIN DISTRACTION TAP 12 DISP (PIN) IMPLANT
PLATE 39MM (Plate) IMPLANT
PUTTY DBF 3CC CORTICAL FIBERS (Putty) IMPLANT
SCREW VA SD 3.5X16 (Screw) IMPLANT
SPIKE FLUID TRANSFER (MISCELLANEOUS) ×1 IMPLANT
SPONGE INTESTINAL PEANUT (DISPOSABLE) ×1 IMPLANT
SPONGE SURGIFOAM ABS GEL SZ50 (HEMOSTASIS) IMPLANT
STAPLER SKIN PROX 35W (STAPLE) IMPLANT
STRIP CLOSURE SKIN 1/2X4 (GAUZE/BANDAGES/DRESSINGS) ×1 IMPLANT
SUT MNCRL AB 4-0 PS2 18 (SUTURE) ×1 IMPLANT
SUT SILK 2 0 TIES 10X30 (SUTURE) IMPLANT
SUT VIC AB 2-0 CP2 18 (SUTURE) IMPLANT
SUT VIC AB 3-0 SH 8-18 (SUTURE) ×1 IMPLANT
SYR 30ML SLIP (SYRINGE) ×1 IMPLANT
TAPE CLOTH 3X10 TAN LF (GAUZE/BANDAGES/DRESSINGS) ×1 IMPLANT
TIP KERRISON THIN FOOTPLATE 1M (MISCELLANEOUS) IMPLANT
TIP KERRISON THIN FOOTPLATE 2M (MISCELLANEOUS) ×1 IMPLANT
TOWEL GREEN STERILE (TOWEL DISPOSABLE) ×1 IMPLANT
TOWEL GREEN STERILE FF (TOWEL DISPOSABLE) ×1 IMPLANT
TRAY FOLEY MTR SLVR 16FR STAT (SET/KITS/TRAYS/PACK) IMPLANT
WATER STERILE IRR 1000ML POUR (IV SOLUTION) ×1 IMPLANT

## 2024-03-13 NOTE — Transfer of Care (Signed)
 Immediate Anesthesia Transfer of Care Note  Patient: [name]  Procedure(s) Performed: ANTERIOR CERVICAL DECOMPRESSION AND FUSION WITH PLATING CERVICAL FOUR-CERVICAL FIVE/CERVICAL FIVE-CERVICAL SIX  Patient Location: PACU  Anesthesia Type:General  Level of Consciousness: awake, alert , and oriented  Airway & Oxygen  Therapy: Patient Spontanous Breathing and Patient connected to nasal cannula oxygen   Post-op Assessment: Report given to RN and Post -op Vital signs reviewed and stable  Post vital signs: Reviewed and stable  Last Vitals:  Vitals Value Taken Time  BP 141/50 03/13/24 1749  Temp    Pulse 64 03/13/24 1751  Resp 14 03/13/24 1751  SpO2 88 % 03/13/24 1751  Vitals shown include unfiled device data.  Last Pain:  Vitals:   03/13/24 1115  TempSrc:   PainSc: 7       Patients Stated Pain Goal: 2 (03/13/24 1115)  Complications: No notable events documented.

## 2024-03-13 NOTE — Progress Notes (Signed)
 Orthopedic Tech Progress Note Patient Details:  [name] 1961-07-11 [id number] Aspen cervical collar was delivered to bedside in PACU Ortho Devices Type of Ortho Device: Aspen cervical collar Ortho Device/Splint Interventions: [name] 03/13/2024, 6:24 PM

## 2024-03-13 NOTE — Anesthesia Procedure Notes (Signed)
 Arterial Line Insertion Start/End5/21/2025 12:50 PM, 03/13/2024 1:00 PM Performed by: Peggy Bowens, MD, anesthesiologist  Patient location: Pre-op. Preanesthetic checklist: patient identified, IV checked, site marked, risks and benefits discussed, surgical consent, monitors and equipment checked, pre-op evaluation, timeout performed and anesthesia consent Lidocaine  1% used for infiltration Left, radial was placed Catheter size: 20 G Hand hygiene performed  and maximum sterile barriers used   Attempts: 1 Procedure performed using ultrasound guided technique. Ultrasound Notes:anatomy identified, needle tip was noted to be adjacent to the nerve/plexus identified, no ultrasound evidence of intravascular and/or intraneural injection and image(s) printed for medical record Following insertion, dressing applied and Biopatch. Post procedure assessment: normal and unchanged  Post procedure complications: second provider assisted. Patient tolerated the procedure well with no immediate complications.

## 2024-03-13 NOTE — Anesthesia Procedure Notes (Signed)
 Procedure Name: Intubation Date/Time: 03/13/2024 1:29 PM  Performed by: Merna Aase, CRNAPre-anesthesia Checklist: Patient identified, Patient being monitored, Timeout performed, Emergency Drugs available and Suction available Patient Re-evaluated:Patient Re-evaluated prior to induction Oxygen  Delivery Method: Circle system utilized Preoxygenation: Pre-oxygenation with 100% oxygen  Induction Type: IV induction and Cricoid Pressure applied Ventilation: Mask ventilation without difficulty Laryngoscope Size: Glidescope and 4 Grade View: Grade II Tube type: Oral Tube size: 7.5 mm Number of attempts: 1 Airway Equipment and Method: Stylet and Video-laryngoscopy Placement Confirmation: ETT inserted through vocal cords under direct vision, positive ETCO2 and breath sounds checked- equal and bilateral Secured at: 21 cm Tube secured with: Tape Dental Injury: Teeth and Oropharynx as per pre-operative assessment

## 2024-03-13 NOTE — Op Note (Addendum)
 PREOP DIAGNOSIS: Cervical stenosis with myelopathy  POSTOP DIAGNOSIS: Cervical stenosis with myelopathy   PROCEDURE: 1. Arthrodesis C4-5, anterior interbody technique, including Discectomy for decompression of spinal cord and exiting nerve roots with foraminotomies  2. Arthrodesis, additional level C5-6 anterior interbody technique, including Discectomy for decompression of spinal cord and exiting nerve roots with foraminotomies  3. Placement of intervertebral biomechanical device C4-5 4. Placement of intervertebral biomechanical device C5-6 5. Placement of anterior instrumentation consisting of interbody plate and screws C4-5-6 6. Use of morselized bone allograft  7. Use of intraoperative microscope 8. Modifier-22 with significantly increased technical difficulty and operative time due to patient's body habitus  SURGEON: Dr. Arvilla Birmingham, MD  ASSISTANT: Easter Golden, PA.  Please note, no qualified trainees were available to assist with the procedure.  Assistance was required for retraction of the visceral structures to safely allow for instrumentation.  ANESTHESIA: General Endotracheal  EBL: 50 ml  IMPLANTS: Medtronic Titan C cage 7 mm medium x 2 39 mm Zevo plate 16mm screws x 6 DBF  SPECIMENS: None  DRAINS: Medium Hemovac drain  COMPLICATIONS: None immediate  CONDITION: Hemodynamically stable to PACU  HISTORY: 63 y.o. male with morbid obesity (BMI 50) presents with bilateral hand numbness and weakness, found to have severe cervical stenosis with myelomalacia.  His stenosis was worst at C5-6 but moderate to severe at C4-5 with cord flattening.  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  Given the severity of his cord impingement with myelomalacia and his progressive symptoms, even though he was at higher risk of surgery given his body habitus, I did discuss the option of surgery in the form of C5 corpectomy versus C4-5 and C5-6 ACDF.   Risks, benefits, alternatives, and expected convalescence were discussed with the patient.  Risks discussed included but were not limited to bleeding, pain, infection, dysphagia, dysphonia, pseudoarthrosis, hardware failure, adjacent segment disease, CSF leak, neurologic deficits, weakness, numbness, paralysis, coma, and death. After all questions were answered, informed consent was obtained.  PROCEDURE IN DETAIL: The patient was brought to the operating room and transferred to the operative table. After induction of general anesthesia, the patient was positioned on the operative table in the supine position with all pressure points meticulously padded. The skin of the neck was then prepped and draped in the usual sterile fashion.  After timeout was conducted, the skin was infiltrated with local anesthetic. Skin incision was then made sharply and Bovie electrocautery was used to dissect the subcutaneous tissue until the platysma was identified. The platysma was then divided and undermined. The sternocleidomastoid muscle was then identified and, utilizing natural fascial planes in the neck, the prevertebral fascia was identified and the carotid sheath was retracted laterally and the trachea and esophagus retracted medially. Again using fluoroscopy, the C4-5 disc space was identified. Bovie electrocautery was used to dissect in the subperiosteal plane and elevate the bilateral longus coli muscles. Self-retaining retractors were then placed.  The patient had very large and rigid anterior osteophytosis which required drilling to reveal the disc spaces.  Caspar distraction pins were placed in the adjacent bodies to allow for gentle distraction.  At this point, the microscope was draped and brought into the field, and the remainder of the case was done under the microscope using microdissecting technique.  The C4-5 disc space was incised sharply and combination of high speed drill, curettes, and rongeurs were use  to initially complete a discectomy. The high-speed drill was then used  to complete discectomy until the posterior annulus was identified and removed and the posterior longitudinal ligament was identified. Using a nerve hook, the PLL was elevated, and Kerrison rongeurs were used to remove the posterior longitudinal ligament and the ventral thecal sac was identified. Using a combination of curettes and rongeurs, complete decompression of the thecal sac and exiting nerve roots at this level was completed, and verified with easy passage of micro-nerve hook centrally and in the bilateral foramina.  It was apparent at this point that doing a C5 corpectomy would significantly increase the risk of surgery compared with a two-level ACDF as he had elevated venous pressures, and additionally the limited radiographic views would make optimal corpectomy cage placement very difficult.  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  Having completed our decompression, attention was turned to placement of the intervertebral device. Trial spacers were used to select a size 7mm graft. This graft was then filled with morcellized allograft, and inserted under live fluoroscopy.  Attention was then turned to the C5-6 level. Caspar distraction pin was placed in the adjacent body to allow for gentle distraction of the disc space.  In a similar fashion, discectomy was completed initially with curettes and rongeurs, and completed with the drill. The PLL was calcified at this level so it was drilled until thin semisoft layer remained in the epidural space was dissected. Using Kerrison rongeurs, decompression of the spinal cord and exiting roots was completed and confirmed with a dissector. Cord was pulsating well through the translucent dura.  Trial spacers were used to select a 7 mm graft. This graft was then filled with  morcellized allograft, and inserted under live fluoroscopy.  After placement of the intervertebral devices, the caspar pins were removed.  An anterior cervical plate was placed across the interspaces for anterior fixation.  Using a high-speed drill, the cortex of the cervical vertebral bodies was punctured, and screws inserted in the vertebral bodies. Final fluoroscopic images in AP and lateral projections were taken to confirm good hardware placement.  At this point, after all counts were verified to be correct, meticulous hemostasis was secured using a combination of bipolar electrocautery and passive hemostatics.  A medium Hemovac drain was placed in the deep cervical space and tunneled out the skin and secured with a stitch.  The platysma muscle was then closed using interrupted 3-0 Vicryl sutures, and the skin was closed with a 4-0 monocryl in subcuticular fashion. Sterile dressings were then applied and the drapes removed.  The patient tolerated the procedure well and was extubated in the room and taken to the postanesthesia care unit in stable condition.  All counts were correct at the end of the procedure.

## 2024-03-13 NOTE — H&P (Signed)
 CC: CSM  HPI:     Patient is a 63 y.o. male presents with bilateral hand numbness and weakness, found to have severe cervical stenosis with myelomalacia.    Patient Active Problem List   Diagnosis Date Noted   Bronchitis 07/06/2023   Chronic respiratory failure with hypoxia (HCC) 07/06/2023   Pulmonary embolism (HCC) 07/06/2023   AKI (acute kidney injury) (HCC) 05/14/2023   Paresthesias 11/22/2022   Hypotension 11/21/2022   Acute kidney injury superimposed on chronic kidney disease (HCC) 11/21/2022   GI bleeding 11/10/2022   History of pulmonary embolism 11/10/2022   Right-sided Bell's palsy 10/11/2022   Class 3 obesity 08/16/2022   Diplopia 08/15/2022   Carpal tunnel syndrome on both sides 03/14/2022   Diabetic ulcer of lower leg (HCC) 06/26/2021   Lumbar radiculitis 03/30/2021   Morbid obesity (HCC) 01/15/2021   History of colonic polyps 01/15/2021   Pain due to onychomycosis of toenails of both feet 01/15/2021   Epistaxis, recurrent 10/21/2020   Laceration of nose 10/21/2020   OSA (obstructive sleep apnea)    Bilateral carotid artery stenosis 07/28/2020   Bilateral lower extremity edema 07/28/2020   CHF (congestive heart failure) (HCC) 07/28/2020   DDD (degenerative disc disease), lumbar 07/28/2020   Diabetic peripheral neuropathy (HCC) 07/28/2020   Fatty liver 07/28/2020   Irritable bowel syndrome with diarrhea 07/28/2020   Osteoarthritis of right hip 07/28/2020   Mixed diabetic hyperlipidemia associated with type 2 diabetes mellitus (HCC) 04/27/2020   Rotator cuff arthropathy 10/30/2019   Cervical radiculopathy 09/24/2019   Trochanteric bursitis of right hip 09/24/2019   Chronic diastolic CHF (congestive heart failure) (HCC) 08/23/2019   ARF (acute renal failure) (HCC) 08/01/2019   Hyperkalemia 08/01/2019   Uncontrolled type 2 diabetes mellitus with hyperglycemia (HCC) 08/01/2019   Trochanteric bursitis 02/11/2019   Long-term insulin  use (HCC) 01/25/2019   Neck  pain 10/09/2018   Left arm weakness 10/09/2018   Decreased sensation of lower extremity 09/12/2018   B12 deficiency 08/10/2017   Persistent headaches 08/09/2017   GERD (gastroesophageal reflux disease) 06/29/2017   Left arm numbness    History of CVA (cerebrovascular accident) 06/17/2017   TIA (transient ischemic attack) 06/17/2017   Degenerative joint disease involving multiple joints 04/09/2017   Foraminal stenosis of lumbar region 01/19/2017   Chronic back pain 12/29/2016   Depression 12/15/2016   Vitamin D  deficiency 12/09/2016   Right hip pain 12/07/2016   Hypokalemia 12/07/2016   Type 2 diabetes mellitus with vascular disease (HCC) 05/31/2016   Normocytic normochromic anemia 05/31/2016   Chest pain 05/31/2016   Coronary artery disease involving native coronary artery of native heart without angina pectoris 01/06/2016   Chronic pancreatitis (HCC) 03/21/2014   Edema 03/21/2014   Chest pain, non-cardiac 01/29/2014   DM2 (diabetes mellitus, type 2) (HCC) 01/29/2014   Obesity, Class III, BMI 40-49.9 (morbid obesity) 01/29/2014   Snoring 01/29/2014   Dyslipidemia 01/29/2014   HTN (hypertension) 01/29/2014   Abnormal nuclear stress test 01/29/2014   Benign essential hypertension 08/15/2013   Past Medical History:  Diagnosis Date   Anemia    Arthritis    Back pain    CAD (coronary artery disease)    a. s/p DES to LAD in 05/2016   Cervical radiculopathy    Chronic diastolic CHF (congestive heart failure) (HCC)    Chronic pain    Deep vein thrombosis (HCC) 01/06/2016   Formatting of this note might be different from the original.  Formatting of this note might be different  from the original. Last Assessment & Plan: Management per primary care. Last Assessment & Plan: Management per primary care.  Formatting of this note might be different from the original. Last Assessment & Plan: Management per primary care. Last Assessment & Plan: Management per primary care.   Depression     DVT (deep venous thrombosis) (HCC)    Dyspnea    GERD (gastroesophageal reflux disease)    Hematemesis    Hepatic steatosis    Hyperlipidemia    Hypertension    IBS (irritable bowel syndrome)    Morbid obesity (HCC)    Myocardial infarction (HCC)    OSA (obstructive sleep apnea)    oxygen  4L/Central at night and PRN   Pancreatitis    PE (pulmonary thromboembolism) (HCC)    Peripheral neuropathy    PUD (peptic ulcer disease)    Pulmonary embolism (HCC) 07/15/2021   Renal disorder    Stroke [facility])    a. ?details unclear - not seen on imaging when he was admitted in 05/2017 for TIA symptoms which were felt due to cervical radiculopathy.   Thoracic aortic ectasia (HCC)    a. 4.3cm ectatic ascending thoracic aorta by CT 06/2017.    Type 2 diabetes mellitus (HCC)     Past Surgical History:  Procedure Laterality Date   CARDIAC CATHETERIZATION N/A 05/31/2016   Procedure: Left Heart Cath and Coronary Angiography;  Surgeon: Peter M Swaziland, MD;  Location: [facility] INVASIVE CV LAB;  Service: Cardiovascular;  Laterality: N/A;   CARDIAC CATHETERIZATION N/A 05/31/2016   Procedure: Intravascular Pressure Wire/FFR Study;  Surgeon: Peter M Swaziland, MD;  Location: [facility] INVASIVE CV LAB;  Service: Cardiovascular;  Laterality: N/A;   CARDIAC CATHETERIZATION N/A 05/31/2016   Procedure: Coronary Stent Intervention;  Surgeon: Peter M Swaziland, MD;  Location: [facility] INVASIVE CV LAB;  Service: Cardiovascular;  Laterality: N/A;   COLONOSCOPY N/A 07/17/2021   Procedure: COLONOSCOPY;  Surgeon: Baldo Bonds, MD;  Location: [facility] ENDOSCOPY;  Service: Endoscopy;  Laterality: N/A;   COLONOSCOPY WITH PROPOFOL  N/A 04/29/2020   Procedure: COLONOSCOPY WITH PROPOFOL ;  Surgeon: Baldo Bonds, MD;  Location: [facility] ENDOSCOPY;  Service: Endoscopy;  Laterality: N/A;   ESOPHAGOGASTRODUODENOSCOPY N/A 04/29/2020   Procedure: ESOPHAGOGASTRODUODENOSCOPY (EGD);  Surgeon: Baldo Bonds, MD;  Location: [facility] ENDOSCOPY;  Service: Endoscopy;  Laterality: N/A;    ESOPHAGOGASTRODUODENOSCOPY (EGD) WITH PROPOFOL  N/A 07/17/2021   Procedure: ESOPHAGOGASTRODUODENOSCOPY (EGD) WITH PROPOFOL ;  Surgeon: Baldo Bonds, MD;  Location: [facility] ENDOSCOPY;  Service: Endoscopy;  Laterality: N/A;   GIVENS CAPSULE STUDY N/A 11/10/2022   Procedure: GIVENS CAPSULE STUDY;  Surgeon: Felecia Hopper, MD;  Location: [facility] ENDOSCOPY;  Service: Gastroenterology;  Laterality: N/A;   LEFT HEART CATH AND CORONARY ANGIOGRAPHY N/A 12/08/2017   Procedure: LEFT HEART CATH AND CORONARY ANGIOGRAPHY;  Surgeon: Arleen Lacer, MD;  Location: [facility] INVASIVE CV LAB;  Service: Cardiovascular;  Laterality: N/A;   LEFT HEART CATHETERIZATION WITH CORONARY ANGIOGRAM N/A 02/03/2014   Procedure: LEFT HEART CATHETERIZATION WITH CORONARY ANGIOGRAM;  Surgeon: Hazle Lites, MD;  Location: [facility] CATH LAB;  Service: Cardiovascular;  Laterality: N/A;   left leg stent      POLYPECTOMY  04/29/2020   Procedure: POLYPECTOMY;  Surgeon: Baldo Bonds, MD;  Location: [facility] ENDOSCOPY;  Service: Endoscopy;;   POLYPECTOMY  07/17/2021   Procedure: POLYPECTOMY;  Surgeon: Baldo Bonds, MD;  Location: [facility] ENDOSCOPY;  Service: Endoscopy;;    Medications Prior to Admission  Medication Sig Dispense Refill Last Dose/Taking   albuterol  (VENTOLIN  HFA) 108 (90 Base) MCG/ACT inhaler  Inhale 2 puffs into the lungs every 6 (six) hours as needed for wheezing or shortness of breath.   03/12/2024   atorvastatin  (LIPITOR ) 80 MG tablet Take 80 mg by mouth daily.   03/12/2024   B Complex-C (B-COMPLEX WITH VITAMIN C) tablet Take 1 tablet by mouth daily. 30 tablet 0 03/12/2024   carvedilol  (COREG ) 6.25 MG tablet Take 6.25 mg by mouth 2 (two) times daily.   03/12/2024 at  8:30 PM   COMBIVENT  RESPIMAT 20-100 MCG/ACT AERS respimat Inhale 1 puff into the lungs 4 (four) times daily.   03/12/2024   dorzolamide-timolol  (COSOPT) 2-0.5 % ophthalmic solution Place 1 drop into both eyes 2 (two) times daily.   03/12/2024   ELIQUIS  5 MG TABS tablet TAKE 1  TABLET BY MOUTH TWICE A DAY 60 tablet 2 03/11/2024   ferrous sulfate  325 (65 FE) MG tablet Take 1 tablet (325 mg total) by mouth daily. 30 tablet 0 03/12/2024   furosemide  (LASIX ) 40 MG tablet Take 40-80 mg by mouth See admin instructions. Take 80 mg by mouth in the morning and 40 mg at night   03/12/2024   gabapentin  (NEURONTIN ) 800 MG tablet Take 800 mg by mouth 3 (three) times daily.   03/12/2024   glipiZIDE  (GLUCOTROL ) 10 MG tablet Take 10 mg by mouth 2 (two) times daily.  4 03/12/2024   HUMULIN  R U-500 KWIKPEN 500 UNIT/ML kwikpen Inject 95-120 Units into the skin in the morning, at noon, and at bedtime. Per sliding scale 3 times daily with meals   03/12/2024   isosorbide  dinitrate (ISORDIL ) 30 MG tablet Take 30 mg by mouth 2 (two) times daily.   03/12/2024   lisinopril  (ZESTRIL ) 20 MG tablet Take 20 mg by mouth daily.   03/12/2024   Menthol , Topical Analgesic, (BIOFREEZE EX) Apply 1 application  topically as needed (knee pain).   Past Week   ondansetron  (ZOFRAN ) 4 MG tablet Take 4 mg by mouth as needed for nausea/vomiting, vomiting or nausea.   Past Week   Oxycodone  HCl 20 MG TABS Take 20 mg by mouth every 6 (six) hours as needed (pain).   Past Week   pantoprazole  (PROTONIX ) 40 MG tablet Take 40 mg by mouth daily.   03/12/2024   potassium chloride  SA (KLOR-CON  M) 20 MEQ tablet Take 20 mEq by mouth 2 (two) times daily.   03/12/2024   sertraline  (ZOLOFT ) 50 MG tablet Take 50 mg by mouth every morning.   03/12/2024   SSD 1 % cream Apply 1 Application topically daily as needed (open wounds).   Past Month   tiZANidine  (ZANAFLEX ) 4 MG tablet Take 4 mg by mouth at bedtime.   03/12/2024   traZODone  (DESYREL ) 50 MG tablet Take 1 tablet (50 mg total) by mouth at bedtime. 30 tablet 11 03/12/2024   Vitamin D , Ergocalciferol , (DRISDOL) 1.25 MG (50000 UT) CAPS capsule Take 50,000 Units by mouth every Monday.   03/12/2024   fenofibrate  (TRICOR ) 145 MG tablet TAKE 1 TABLET (145 MG TOTAL) BY MOUTH DAILY. (Patient not  taking: Reported on 02/28/2024) 90 tablet 3 Not Taking   Insulin  Pen Needle 31G X 5 MM MISC Use 1 needle daily to inject insulin  as prescribed 100 each 2    nitroGLYCERIN  (NITROSTAT ) 0.4 MG SL tablet Place 1 tablet (0.4 mg total) under the tongue every 5 (five) minutes as needed for chest pain. 100 tablet 1 More than a month   potassium chloride  (MICRO-K ) 10 MEQ CR capsule Take 2 capsules (20 mEq  total) by mouth 2 (two) times daily. (Patient not taking: Reported on 02/28/2024) 120 capsule 3 Not Taking   triamcinolone  cream (KENALOG ) 0.1 % Apply 1 Application topically daily as needed (itching).   More than a month   Allergies  Allergen Reactions   Coconut Fatty Acid Hives   Coconut Flavor [Flavoring Agent (Non-Screening)] Hives   Ibuprofen Other (See Comments)    Made gastric ulcers worse   Aleve  [Naproxen ] Other (See Comments)    DUE TO KIDNEYS   Nsaids Other (See Comments)    Stomach ulcers    Vascepa  [Icosapent  Ethyl (Epa Ethyl Ester) (Fish)] Nausea And Vomiting and Other (See Comments)    headaches, chest pain, similar to sx of a stroke, hypotension    Other Hives and Rash    Nut Allergy    Social History   Tobacco Use   Smoking status: Never    Passive exposure: Past   Smokeless tobacco: Never  Substance Use Topics   Alcohol  use: No    Family History  Problem Relation Age of Onset   Cancer Father    Hypertension Mother    Diabetes Mother    Breast cancer Mother    Hypertension Brother    Diabetes Brother    Hypertension Sister    Diabetes Sister      Review of Systems Pertinent items are noted in HPI.  Objective:   Patient Vitals for the past 8 hrs:  BP Temp src Pulse Resp SpO2 Height Weight  03/13/24 1023 (!) 147/81 Oral 66 20 96 % 5\' 10"  (1.778 m) (!) 161 kg   No intake/output data recorded. No intake/output data recorded.      General : Alert, cooperative, no distress, appears stated age. obese   Head:  Normocephalic/atraumatic    Eyes: PERRL,  conjunctiva/corneas clear, EOM's intact. Fundi could not be visualized Neck: Supple Chest:  Respirations unlabored Chest wall: no tenderness or deformity Heart: Regular rate and rhythm Abdomen: Soft, nontender and nondistended Extremities: warm and well-perfused Skin: normal turgor, color and texture Neurologic:  Alert, oriented x 3.  Eyes open spontaneously. PERRL, EOMI, VFC, no facial droop. V1-3 intact.  No dysarthria, tongue protrusion symmetric.  CNII-XII intact. Normal strength, sensation and reflexes throughout.  4/5 hand grip        Data ReviewCBC:  Lab Results  Component Value Date   WBC 9.1 03/04/2024   RBC 4.00 (L) 03/04/2024   BMP:  Lab Results  Component Value Date   GLUCOSE 135 (H) 03/04/2024   CO2 31 03/04/2024   BUN 8 03/04/2024   BUN 14 12/27/2021   CREATININE 0.71 03/04/2024   CREATININE 0.94 01/28/2022   CREATININE 0.74 03/08/2019   CALCIUM  9.2 03/04/2024   Radiology review:   See clinic note for details  Assessment:   Active Problems:   * No active hospital problems. *  63 yo M with morbid obesity who has severe cervical stenosis with progressive myelopathy symptoms Plan:   - plan for C5 corpectomy, C4-6 anterior plating - Risks, benefits, alternatives, and expected convalescence were discussed with the patient.  Risks discussed included, but were not limited to bleeding, pain, infection, dysphagia, dysphonia, scar, spinal fluid leak, neurologic deficit, instability, pseudoarthrosis, adjacent segment disease, damage to nearby organs, and death.  Informed consent was obtained.

## 2024-03-14 ENCOUNTER — Encounter (HOSPITAL_COMMUNITY): Payer: Self-pay | Admitting: Neurosurgery

## 2024-03-14 LAB — GLUCOSE, CAPILLARY
Glucose-Capillary: 248 mg/dL — ABNORMAL HIGH (ref 70–99)
Glucose-Capillary: 232 mg/dL — ABNORMAL HIGH (ref 70–99)
Glucose-Capillary: 475 mg/dL — ABNORMAL HIGH (ref 70–99)
Glucose-Capillary: 576 mg/dL (ref 70–99)
Glucose-Capillary: 284 mg/dL — ABNORMAL HIGH (ref 70–99)
Glucose-Capillary: 279 mg/dL — ABNORMAL HIGH (ref 70–99)
Glucose-Capillary: 293 mg/dL — ABNORMAL HIGH (ref 70–99)

## 2024-03-14 LAB — BASIC METABOLIC PANEL WITH GFR
Anion gap: 11 (ref 5–15)
BUN: 9 mg/dL (ref 6–20)
CO2: 25 mmol/L (ref 22–32)
Calcium: 8.5 mg/dL — ABNORMAL LOW (ref 8.9–10.3)
Chloride: 103 mmol/L (ref 98–111)
Creatinine, Ser: 0.78 mg/dL (ref 0.61–1.24)
GFR, Estimated: >60 mL/min (ref >60)
Glucose, Bld: 270 mg/dL — ABNORMAL HIGH (ref 70–99)
Potassium: 3.8 mmol/L (ref 3.5–5.1)
Sodium: 139 mmol/L (ref 135–145)

## 2024-03-14 MED ORDER — ENOXAPARIN SODIUM 80 MG/0.8ML IJ SOSY
80.0000 mg | PREFILLED_SYRINGE | INTRAMUSCULAR | Status: DC
  Administered 2024-03-14 – 2024-03-15 (×2): 80 mg via SUBCUTANEOUS
  Filled 2024-03-14 (×3): qty 0.8

## 2024-03-14 MED ORDER — INSULIN REGULAR HUMAN (CONC) 500 UNIT/ML ~~LOC~~ SOPN
60.0000 [IU] | PEN_INJECTOR | Freq: Three times a day (TID) | SUBCUTANEOUS | Status: DC
  Administered 2024-03-14 – 2024-03-16 (×6): 60 [IU] via SUBCUTANEOUS
  Filled 2024-03-14: qty 3

## 2024-03-14 MED ORDER — IPRATROPIUM-ALBUTEROL 0.5-2.5 (3) MG/3ML IN SOLN
3.0000 mL | Freq: Two times a day (BID) | RESPIRATORY_TRACT | Status: DC
  Administered 2024-03-14 – 2024-03-16 (×4): 3 mL via RESPIRATORY_TRACT
  Filled 2024-03-14 (×5): qty 3

## 2024-03-14 MED ORDER — ENOXAPARIN SODIUM 40 MG/0.4ML IJ SOSY: 40.0000 mg | PREFILLED_SYRINGE | INTRAMUSCULAR | Status: DC

## 2024-03-14 MED FILL — Thrombin For Soln 5000 Unit: CUTANEOUS | Qty: 2 | Status: AC

## 2024-03-14 NOTE — Plan of Care (Signed)
  Problem: Education: Goal: Knowledge of General Education information will improve Description: Including pain rating scale, medication(s)/side effects and non-pharmacologic comfort measures Outcome: Progressing   Problem: Health Behavior/Discharge Planning: Goal: Ability to manage health-related needs will improve Outcome: Progressing   Problem: Clinical Measurements: Goal: Ability to maintain clinical measurements within normal limits will improve Outcome: Progressing   Problem: Nutrition: Goal: Adequate nutrition will be maintained Outcome: Progressing   Problem: Safety: Goal: Ability to remain free from injury will improve Outcome: Progressing   

## 2024-03-14 NOTE — Evaluation (Signed)
 Occupational Therapy Evaluation Patient Details Name: [name] MRN: [id number] DOB: 05-Mar-1961 Today's Date: 03/14/2024   History of Present Illness   Patient is a 63 year old male with cervical myelopathy s/p ACDF C4-6. PMH: morbid obesity, PE, DVT, AKI, chronic pain, anemia, arthritis, CHF, DM     Clinical Impressions PTA, pt lives alone, typically ambulatory with a cane vs Rollator. Pt has aide assist 3 days/week for ADLs/IADLs as needed but pt reports able to manage most ADLs w/o assist at baseline. Evaluation somewhat limited by pt lethargy today. Educated re: cervical precautions for ADLs, cervical collar wear/mgmt, IADL considerations and where assist may be needed. Pt requires overall CGA for transfers using RW and Min A for ADLs. Pt reports new R shoulder pain post op; premedicated prior to session and OT encouraged AROM as tolerated + application of ice at end of session. Recommend HHOT follow up at DC.     If plan is discharge home, recommend the following:   A little help with bathing/dressing/bathroom;Assistance with cooking/housework;Assist for transportation     Functional Status Assessment   Patient has had a recent decline in their functional status and demonstrates the ability to make significant improvements in function in a reasonable and predictable amount of time.     Equipment Recommendations   None recommended by OT     Recommendations for Other Services         Precautions/Restrictions   Precautions Precautions: Fall;Cervical Precaution Booklet Issued: No Recall of Precautions/Restrictions: Intact Required Braces or Orthoses: Cervical Brace (ASPEN cervical collar) Restrictions Weight Bearing Restrictions Per Provider Order: No     Mobility Bed Mobility               General bed mobility comments: in recliner on entry and exit    Transfers Overall transfer level: Needs assistance Equipment used: Rolling walker (2  wheels) Transfers: Sit to/from Stand Sit to Stand: Contact guard assist           General transfer comment: increased effort but able to stand from recliner using armrests      Balance Overall balance assessment: Needs assistance Sitting-balance support: Feet supported Sitting balance-Leahy Scale: Good     Standing balance support: Bilateral upper extremity supported Standing balance-Leahy Scale: Poor Standing balance comment: relying on rolling walker for support while standing                           ADL either performed or assessed with clinical judgement   ADL Overall ADL's : Needs assistance/impaired Eating/Feeding: Independent;Sitting   Grooming: Set up;Sitting   Upper Body Bathing: Minimal assistance;Sitting   Lower Body Bathing: Minimal assistance;Sitting/lateral leans;Sit to/from stand   Upper Body Dressing : Minimal assistance;Sitting   Lower Body Dressing: Minimal assistance;Sit to/from stand;Sitting/lateral leans Lower Body Dressing Details (indicate cue type and reason): unable to cross LE to reach feet. reports wearing slip on shoes at baseline. Discussed strategies for LB dressing w/ plans to further assess due to pt lethargy today. Pt does endorse his aide can assist as needed. Toilet Transfer: Contact guard assist;Ambulation;Rolling walker (2 wheels)   Toileting- Clothing Manipulation and Hygiene: Contact guard assist;Sitting/lateral lean;Sit to/from stand       Functional mobility during ADLs: Contact guard assist;Rolling walker (2 wheels)       Vision Ability to See in Adequate Light: 0 Adequate Patient Visual Report: No change from baseline Vision Assessment?: No apparent visual deficits  Perception         Praxis         Pertinent Vitals/Pain Pain Assessment Pain Assessment: Faces Faces Pain Scale: Hurts little more Pain Location: R shoulder Pain Descriptors / Indicators: Grimacing, Guarding, Sore Pain  Intervention(s): Monitored during session, Premedicated before session, Ice applied     Extremity/Trunk Assessment Upper Extremity Assessment Upper Extremity Assessment: Right hand dominant;RUE deficits/detail RUE Deficits / Details: R shoulder pain post op per pt. shoulder flex and abduct to 90* though pain indicated. Encouraged AROM as tolerated to decrease stiffness. applied ice   Lower Extremity Assessment Lower Extremity Assessment: Defer to PT evaluation RLE Deficits / Details: dorsiflexion 5/5. endurance impaired for sustained activity in standing RLE Sensation: decreased light touch LLE Deficits / Details: dorsiflexion 5/5. endurance impaired for sustained activity in standing LLE Sensation: decreased light touch   Cervical / Trunk Assessment Cervical / Trunk Assessment: Neck Surgery   Communication Communication Communication: No apparent difficulties   Cognition Arousal: Lethargic Behavior During Therapy: WFL for tasks assessed/performed Cognition: No apparent impairments             OT - Cognition Comments: Pt lethargic but answering questions appropriately though not detailed. able to express needs, verbalizing understanding of education for cervical precautions, when to wear neck brace, etc                 Following commands: Intact       Cueing  General Comments   Cueing Techniques: Verbal cues  Pt's sister entering during session; provided brief overview of cervical precautions and where pt may need assistance   Exercises     Shoulder Instructions      Home Living Family/patient expects to be discharged to:: Private residence Living Arrangements: Alone Available Help at Discharge: Family;Available PRN/intermittently;Personal care attendant Type of Home: Apartment Home Access: Elevator     Home Layout: One level     Bathroom Shower/Tub: Chief Strategy Officer: Standard     Home Equipment: Rollator (4 wheels);Cane - single  point;Tub bench;Other (comment) (lift chair)   Additional Comments: patient sleeps in a recliner      Prior Functioning/Environment Prior Level of Function : Needs assist             Mobility Comments: patient reports he sits on the rollator and wheels himself around. in the community he uses a cane. limited distance ambulation at baseline ADLs Comments: he reports home health nursing 3 days/week for assistance with bathing and dressing, laundry as needed. Drives but son can assist with driving at DC per pt    OT Problem List: Decreased activity tolerance;Decreased range of motion;Decreased knowledge of use of DME or AE;Decreased knowledge of precautions;Pain   OT Treatment/Interventions: Self-care/ADL training;Therapeutic exercise;Energy conservation;DME and/or AE instruction;Therapeutic activities;Patient/family education;Balance training      OT Goals(Current goals can be found in the care plan section)   Acute Rehab OT Goals Patient Stated Goal: not feel so sleepy, for shoulder to stop hurting OT Goal Formulation: With patient Time For Goal Achievement: 03/28/24 Potential to Achieve Goals: Good ADL Goals Pt Will Perform Lower Body Bathing: with modified independence;sit to/from stand;with adaptive equipment;sitting/lateral leans Pt Will Perform Lower Body Dressing: with modified independence;sit to/from stand;sitting/lateral leans;with adaptive equipment Pt Will Transfer to Toilet: with modified independence;ambulating   OT Frequency:  Min 2X/week    Co-evaluation              AM-PAC OT "6 Clicks" Daily Activity  Outcome Measure Help from another person eating meals?: None Help from another person taking care of personal grooming?: None Help from another person toileting, which includes using toliet, bedpan, or urinal?: A Little Help from another person bathing (including washing, rinsing, drying)?: A Little Help from another person to put on and taking off  regular upper body clothing?: A Little Help from another person to put on and taking off regular lower body clothing?: A Little 6 Click Score: 20   End of Session Equipment Utilized During Treatment: Rolling walker (2 wheels);Cervical collar  Activity Tolerance: Patient limited by lethargy Patient left: in chair;with call bell/phone within reach;with chair alarm set;with family/visitor present  OT Visit Diagnosis: Unsteadiness on feet (R26.81);Other abnormalities of gait and mobility (R26.89);Pain Pain - Right/Left: Right Pain - part of body: Shoulder                Time: 1610-9604 OT Time Calculation (min): 23 min Charges:  OT General Charges $OT Visit: 1 Visit OT Evaluation $OT Eval Low Complexity: 1 Low  [name], OTR/L Acute Rehab Services Office: [phone]   [name] 03/14/2024, 12:59 PM

## 2024-03-14 NOTE — Inpatient Diabetes Management (Signed)
 Inpatient Diabetes Program Recommendations  AACE/ADA: New Consensus Statement on Inpatient Glycemic Control (2015)  Target Ranges:  Prepandial:   less than 140 mg/dL      Peak postprandial:   less than 180 mg/dL (1-2 hours)      Critically ill patients:  140 - 180 mg/dL   Lab Results  Component Value Date   GLUCAP 248 (H) 03/14/2024   HGBA1C 9.1 (H) 03/04/2024    Review of Glycemic Control  Latest Reference Range & Units 03/13/24 10:24 03/13/24 13:07 03/13/24 15:47 03/13/24 17:53 03/13/24 21:48 03/14/24 06:26  Glucose-Capillary 70 - 99 mg/dL 409 (H) 811 (H) 914 (H) 247 (H) 208 (H) 248 (H)  (H): Data is abnormally high  Diabetes history: DM2 Outpatient Diabetes medications:  Humulin  U-500 95-120 units TID Glipizide  10 mg BID Current orders for Inpatient glycemic control:  Glipizide  10 mg BID Novolog  0-20 units TID and 0-5 units QHS  Inpatient Diabetes Program Recommendations:    Please consider:  Semglee  20 units every day  Thank you, Hays Lipschutz, MSN, CDCES Diabetes Coordinator Inpatient Diabetes Program [phone] (team pager from 8a-5p)

## 2024-03-14 NOTE — Evaluation (Signed)
 Physical Therapy Evaluation Patient Details Name: [name] MRN: [id number] DOB: 1960-10-28 Today's Date: 03/14/2024  History of Present Illness  Patient is a 63 year old male with cervical myelopathy s/p ACDF C4-6. PMH: morbid obesity, PE, DVT, AKI, chronic pain, anemia, arthritis, CHF, DM  Clinical Impression  Patient is agreeable to PT evaluation. He reports he lives at home alone with an aide that assist 3 days/week with ADLs. He wheels around using the rollator in the home and uses a cane in the community for ambulation. He reports using PRN 02 at baseline.  Today the patient was able to stand and walk with CGA using rolling walker. Occasional cues for improved safety using rolling walker for hallway ambulation. Mild dyspnea with exertion with Sp02 89% on room air immediately after walking. Recommend PT follow up to maximize independence and decrease caregiver burden.       If plan is discharge home, recommend the following: A little help with bathing/dressing/bathroom;Assistance with cooking/housework;Assist for transportation   Can travel by private vehicle        Equipment Recommendations Rolling walker (2 wheels)  Recommendations for Other Services       Functional Status Assessment Patient has had a recent decline in their functional status and demonstrates the ability to make significant improvements in function in a reasonable and predictable amount of time.     Precautions / Restrictions Precautions Precautions: Fall;Back Precaution Booklet Issued: No Recall of Precautions/Restrictions: Intact Required Braces or Orthoses: Spinal Brace (ASPEN cervical collar) Restrictions Weight Bearing Restrictions Per Provider Order: No      Mobility  Bed Mobility               General bed mobility comments: not assessed as patient sitting up on arrival and post session. he reports sleeping in a chair at home    Transfers Overall transfer level: Needs  assistance Equipment used: Rolling walker (2 wheels) Transfers: Sit to/from Stand Sit to Stand: Contact guard assist           General transfer comment: cues for hand placement with sitting    Ambulation/Gait Ambulation/Gait assistance: Contact guard assist Gait Distance (Feet): 75 Feet Assistive device: Rolling walker (2 wheels) Gait Pattern/deviations: Step-through pattern, Trunk flexed Gait velocity: decreased     General Gait Details: cues to stand closer to rolling walker for support. activity tolerance limited by fatigue  Stairs            Wheelchair Mobility     Tilt Bed    Modified Rankin (Stroke Patients Only)       Balance Overall balance assessment: Needs assistance Sitting-balance support: Feet supported Sitting balance-Leahy Scale: Good     Standing balance support: Bilateral upper extremity supported Standing balance-Leahy Scale: Poor Standing balance comment: relying on rolling walker for support while standing                             Pertinent Vitals/Pain Pain Assessment Pain Assessment: No/denies pain    Home Living Family/patient expects to be discharged to:: Private residence Living Arrangements: Alone Available Help at Discharge: Family;Available PRN/intermittently Type of Home: Apartment Home Access: Elevator       Home Layout: One level Home Equipment: Rollator (4 wheels);Cane - single point;Tub bench Additional Comments: patient sleeps in a chair    Prior Function Prior Level of Function : Needs assist             Mobility  Comments: patient reports he sits on the rollator and wheels himself around. in the community he uses a cane. limited distance ambulation at baseline ADLs Comments: he reports home health nursing 3 days/week for assistance with bathing and dressing     Extremity/Trunk Assessment   Upper Extremity Assessment Upper Extremity Assessment: Defer to OT evaluation    Lower Extremity  Assessment Lower Extremity Assessment: LLE deficits/detail;RLE deficits/detail RLE Deficits / Details: dorsiflexion 5/5. endurance impaired for sustained activity in standing RLE Sensation: decreased light touch LLE Deficits / Details: dorsiflexion 5/5. endurance impaired for sustained activity in standing LLE Sensation: decreased light touch    Cervical / Trunk Assessment Cervical / Trunk Assessment: Neck Surgery (cervical collar on throughout session)  Communication   Communication Communication: No apparent difficulties    Cognition Arousal: Alert Behavior During Therapy: WFL for tasks assessed/performed   PT - Cognitive impairments: No apparent impairments                         Following commands: Intact       Cueing Cueing Techniques: Verbal cues     General Comments General comments (skin integrity, edema, etc.): supplemental 02 removed for walking with Sp02 down to 89% initially after walking. mild dyspnea with exertion    Exercises     Assessment/Plan    PT Assessment Patient needs continued PT services  PT Problem List Decreased strength;Decreased range of motion;Decreased activity tolerance;Decreased balance;Decreased mobility;Decreased safety awareness;Decreased knowledge of use of DME       PT Treatment Interventions DME instruction;Gait training;Stair training;Functional mobility training;Therapeutic activities;Therapeutic exercise;Balance training;Neuromuscular re-education;Patient/family education    PT Goals (Current goals can be found in the Care Plan section)  Acute Rehab PT Goals Patient Stated Goal: to return home PT Goal Formulation: With patient Time For Goal Achievement: 03/28/24 Potential to Achieve Goals: Good    Frequency Min 5X/week     Co-evaluation               AM-PAC PT "6 Clicks" Mobility  Outcome Measure Help needed turning from your back to your side while in a flat bed without using bedrails?: A Little Help  needed moving from lying on your back to sitting on the side of a flat bed without using bedrails?: A Little Help needed moving to and from a bed to a chair (including a wheelchair)?: A Little Help needed standing up from a chair using your arms (e.g., wheelchair or bedside chair)?: A Little Help needed to walk in hospital room?: A Little Help needed climbing 3-5 steps with a railing? : A Little 6 Click Score: 18    End of Session Equipment Utilized During Treatment: Cervical collar;Gait belt Activity Tolerance: Patient tolerated treatment well Patient left: in chair;with call bell/phone within reach;with chair alarm set   PT Visit Diagnosis: Difficulty in walking, not elsewhere classified (R26.2);Unsteadiness on feet (R26.81)    Time: 8469-6295 PT Time Calculation (min) (ACUTE ONLY): 17 min   Charges:   PT Evaluation $PT Eval Low Complexity: 1 Low PT Treatments $Gait Training: 8-22 mins PT General Charges $$ ACUTE PT VISIT: 1 Visit         Ozie Bo, PT, MPT   Erlene Hawks 03/14/2024, 10:45 AM

## 2024-03-14 NOTE — Progress Notes (Signed)
    Providing Compassionate, Quality Care - Together   NEUROSURGERY PROGRESS NOTE     S: No issues overnight.    O: EXAM:  BP 129/62 (BP Location: Left Arm)   Pulse 62   Temp 98.1 F (36.7 C) (Oral)   Resp 18   Ht 5\' 10"  (1.778 m)   Wt (!) 161 kg   SpO2 96%   BMI 50.94 kg/m     Awake, alert, oriented  Speech fluent, appropriate  BUE/BLE 5/5 SILTx4 Dressing c/d/I Collar in place   ASSESSMENT:  63 y.o. with cervical myelopathy s/p ACDF C4-6    PLAN: -Therapies as tolerated -Supportive care -Continue drain, likely dc tmrw.  -Call w/ questions/concerns.   Easter Golden, [facility]

## 2024-03-14 NOTE — TOC Initial Note (Signed)
 Transition of Care [facility]) - Initial/Assessment Note    Patient Details  Name: [name] MRN: [id number] Date of Birth: [birth year]  [facility] of [facility]) CM/SW Contact:    Alisa App, RN Phone Number: 03/14/2024, 3:12 PM  Clinical Narrative:                      - cervical myelopathy s/p ACDF C4-6  From home alone. States son to assist with care once d/c. Receives PCS 3X/week, 10am-3p. NCM shared PT'S recommendations for home health services. Pt agreeable. Pt without provider preference. Referral made with [facility] and accepted , SOC , Tues./ next week. [facility] wihout DME needs[facility]                                                                                                                                                                                                                                    TOC team following and will assist with TOC needs.......  Expected Discharge Plan: Home w Home Health Services Barriers to Discharge: Continued Medical Work up   Patient Goals and CMS Choice     Choice offered to / list presented to : Patient      Expected Discharge Plan and Services   Discharge Planning Services: CM Consult   Living arrangements for the past 2 months: Apartment                           HH Arranged: PT, OT HH Agency: [facility] Date [facility] Agency Contacted: 03/14/24 Time HH Agency Contacted: 1507 Representative spoke with at [facility] Agency: Loetta Ringer  Prior Living Arrangements/Services Living arrangements for the past 2 months: Apartment Lives with:: Self   Do you feel safe going back to the place where you live?: Yes          Current home services: DME (RW, cane, crutches)    Activities of Daily Living   ADL Screening (condition at time of admission) Independently performs ADLs?: Yes (appropriate for developmental age) Is the patient deaf or have difficulty hearing?: No Does the patient  have difficulty seeing, even when wearing glasses/contacts?: No Does the patient have difficulty concentrating, remembering, or making decisions?: No  Permission Sought/Granted                  Emotional Assessment  Admission diagnosis:  Cervical spondylosis with myelopathy [M47.12] Cervical myelopathy (HCC) [G95.9] Patient Active Problem List   Diagnosis Date Noted   Cervical myelopathy (HCC) 03/13/2024   Bronchitis 07/06/2023   Chronic respiratory failure with hypoxia (HCC) 07/06/2023   Pulmonary embolism (HCC) 07/06/2023   AKI (acute kidney injury) (HCC) 05/14/2023   Paresthesias 11/22/2022   Hypotension 11/21/2022   Acute kidney injury superimposed on chronic kidney disease (HCC) 11/21/2022   GI bleeding 11/10/2022   History of pulmonary embolism 11/10/2022   Right-sided Bell's palsy 10/11/2022   Class 3 obesity 08/16/2022   Diplopia 08/15/2022   Carpal tunnel syndrome on both sides 03/14/2022   Diabetic ulcer of lower leg (HCC) 06/26/2021   Lumbar radiculitis 03/30/2021   Morbid obesity (HCC) 01/15/2021   History of colonic polyps 01/15/2021   Pain due to onychomycosis of toenails of both feet 01/15/2021   Epistaxis, recurrent 10/21/2020   Laceration of nose 10/21/2020   OSA (obstructive sleep apnea)    Bilateral carotid artery stenosis 07/28/2020   Bilateral lower extremity edema 07/28/2020   CHF (congestive heart failure) (HCC) 07/28/2020   DDD (degenerative disc disease), lumbar 07/28/2020   Diabetic peripheral neuropathy (HCC) 07/28/2020   Fatty liver 07/28/2020   Irritable bowel syndrome with diarrhea 07/28/2020   Osteoarthritis of right hip 07/28/2020   Mixed diabetic hyperlipidemia associated with type 2 diabetes mellitus (HCC) 04/27/2020   Rotator cuff arthropathy 10/30/2019   Cervical radiculopathy 09/24/2019   Trochanteric bursitis of right hip 09/24/2019   Chronic diastolic CHF (congestive heart failure) (HCC) 08/23/2019   ARF  (acute renal failure) (HCC) 08/01/2019   Hyperkalemia 08/01/2019   Uncontrolled type 2 diabetes mellitus with hyperglycemia (HCC) 08/01/2019   Trochanteric bursitis 02/11/2019   Long-term insulin  use (HCC) 01/25/2019   Neck pain 10/09/2018   Left arm weakness 10/09/2018   Decreased sensation of lower extremity 09/12/2018   B12 deficiency 08/10/2017   Persistent headaches 08/09/2017   GERD (gastroesophageal reflux disease) 06/29/2017   Left arm numbness    History of CVA (cerebrovascular accident) 06/17/2017   TIA (transient ischemic attack) 06/17/2017   Degenerative joint disease involving multiple joints 04/09/2017   Foraminal stenosis of lumbar region 01/19/2017   Chronic back pain 12/29/2016   Depression 12/15/2016   Vitamin D  deficiency 12/09/2016   Right hip pain 12/07/2016   Hypokalemia 12/07/2016   Type 2 diabetes mellitus with vascular disease (HCC) 05/31/2016   Normocytic normochromic anemia 05/31/2016   Chest pain 05/31/2016   Coronary artery disease involving native coronary artery of native heart without angina pectoris 01/06/2016   Chronic pancreatitis (HCC) 03/21/2014   Edema 03/21/2014   Chest pain, non-cardiac 01/29/2014   DM2 (diabetes mellitus, type 2) (HCC) 01/29/2014   Obesity, Class III, BMI 40-49.9 (morbid obesity) 01/29/2014   Snoring 01/29/2014   Dyslipidemia 01/29/2014   HTN (hypertension) 01/29/2014   Abnormal nuclear stress test 01/29/2014   Benign essential hypertension 08/15/2013   PCP:  Valrie Gehrig, MD Pharmacy:   [facility] [place], [place] - [address] [address] [place] [address] Phone: [phone] Fax: [phone]     Social Drivers of Health (SDOH) Social History: SDOH Screenings   Food Insecurity: No Food Insecurity (03/13/2024)  Housing: Low Risk  (03/13/2024)  Transportation Needs: No Transportation Needs (03/13/2024)  Utilities: Not At Risk (03/13/2024)  Financial Resource Strain:  Not on File (02/10/2022)   Received from [facility]  Physical Activity: Not on File (02/10/2022)  Received from [place], [place]  Social Connections: Not on File (07/06/2023)   Received from [facility]  Stress: Not on File (02/10/2022)   Received from [place], [place]  Tobacco Use: Low Risk  (03/13/2024)   SDOH Interventions:     Readmission Risk Interventions    05/15/2023   11:25 AM 11/23/2022    9:16 AM 06/29/2021   11:43 AM  Readmission Risk Prevention Plan  Transportation Screening Complete Complete Complete  PCP or Specialist Appt within 3-5 Days Complete    HRI or Home Care Consult Complete    Social Work Consult for Recovery Care Planning/Counseling Complete    Palliative Care Screening Not Applicable    Medication Review Oceanographer) Complete Complete Complete  PCP or Specialist appointment within 3-5 days of discharge  Complete Complete  HRI or Home Care Consult  Complete Complete  SW Recovery Care/Counseling Consult  Complete Complete  Palliative Care Screening  Not Applicable Not Applicable  Skilled Nursing Facility  Not Applicable Not Applicable

## 2024-03-14 NOTE — Anesthesia Postprocedure Evaluation (Signed)
 Anesthesia Post Note  Patient: [name]  Procedure(s) Performed: ANTERIOR CERVICAL DECOMPRESSION AND FUSION WITH PLATING CERVICAL FOUR-CERVICAL FIVE/CERVICAL FIVE-CERVICAL SIX     Patient location during evaluation: PACU Anesthesia Type: General Level of consciousness: sedated and patient cooperative Pain management: pain level controlled Vital Signs Assessment: post-procedure vital signs reviewed and stable Respiratory status: spontaneous breathing Cardiovascular status: stable Anesthetic complications: no   No notable events documented.  Last Vitals:  Vitals:   03/13/24 2022 03/14/24 0409  BP:  (!) 155/84  Pulse: 71 69  Resp: 16 18  Temp:  36.7 C  SpO2: 97% 100%    Last Pain:  Vitals:   03/14/24 0521  TempSrc:   PainSc: 7                  [name]

## 2024-03-15 LAB — GLUCOSE, CAPILLARY
Glucose-Capillary: 213 mg/dL — ABNORMAL HIGH (ref 70–99)
Glucose-Capillary: 182 mg/dL — ABNORMAL HIGH (ref 70–99)
Glucose-Capillary: 185 mg/dL — ABNORMAL HIGH (ref 70–99)
Glucose-Capillary: 178 mg/dL — ABNORMAL HIGH (ref 70–99)

## 2024-03-15 MED ORDER — OXYCODONE HCL 5 MG PO TABS
20.0000 mg | ORAL_TABLET | Freq: Four times a day (QID) | ORAL | Status: DC | PRN
  Administered 2024-03-15 – 2024-03-16 (×3): 20 mg via ORAL
  Filled 2024-03-15 (×3): qty 4

## 2024-03-15 NOTE — Progress Notes (Signed)
 Physical Therapy Treatment Patient Details Name: [name] MRN: [id number] DOB: 09-14-1961 Today's Date: 03/15/2024   History of Present Illness Patient is a 63 year old male with cervical myelopathy s/p ACDF C4-6. PMH: morbid obesity, PE, DVT, AKI, chronic pain, anemia, arthritis, CHF, DM    PT Comments  Pt lethargic-appearing upon PT arrival to room, frequent eye closing throughout session. PT asked pt if he was tired and he states "no, I am just hurting". Pt ambulatory for short hallway distance with use of RW, pt endorsing min constant dizziness but VSS (RA throughout session, SPO2 92% post-gait with DOE 2/4, BP 160/76, pulse 76). Pt overall is supervision to CGA for mobility at this time. PT to continue to follow while acute.      If plan is discharge home, recommend the following: A little help with bathing/dressing/bathroom;Assistance with cooking/housework;Assist for transportation   Can travel by private vehicle        Equipment Recommendations  Rolling walker (2 wheels)    Recommendations for Other Services       Precautions / Restrictions Precautions Precautions: Fall;Cervical Precaution Booklet Issued: Yes (comment) Recall of Precautions/Restrictions: Intact Precaution/Restrictions Comments: reviewed cervical precautions Required Braces or Orthoses: Cervical Brace Cervical Brace: Hard collar;At all times Restrictions Weight Bearing Restrictions Per Provider Order: No     Mobility  Bed Mobility               General bed mobility comments: in recliner on entry and exit    Transfers Overall transfer level: Needs assistance Equipment used: Rolling walker (2 wheels) Transfers: Sit to/from Stand Sit to Stand: Supervision           General transfer comment: slow to rise and steady, cues for hand placement when rising and sitting    Ambulation/Gait Ambulation/Gait assistance: Supervision Gait Distance (Feet): 70 Feet Assistive device: Rolling  walker (2 wheels) Gait Pattern/deviations: Step-through pattern, Trunk flexed, Wide base of support Gait velocity: decr     General Gait Details: cues for closer proximity of RW, upright posture. pt endorsing dizziness with mobility that remains stable throughout, BP and HR 160/76 and 76 bpm upon return to room. SPO2 92% on RA post-gait, DOE 2/4   Stairs             Wheelchair Mobility     Tilt Bed    Modified Rankin (Stroke Patients Only)       Balance Overall balance assessment: Needs assistance Sitting-balance support: No upper extremity supported, Feet supported Sitting balance-Leahy Scale: Good     Standing balance support: Bilateral upper extremity supported, During functional activity, Reliant on assistive device for balance Standing balance-Leahy Scale: Poor Standing balance comment: Reliant on external support                            Communication Communication Communication: No apparent difficulties  Cognition Arousal: Lethargic Behavior During Therapy: Flat affect   PT - Cognitive impairments: No apparent impairments                       PT - Cognition Comments: pt very lethargic, periods of eye closing throughout session Following commands: Intact      Cueing Cueing Techniques: Verbal cues  Exercises      General Comments General comments (skin integrity, edema, etc.): RA throughout session, SPO2 92% post-gait with DOE 2/4 Home walking program: up and walking 1x/hour during waking hours for short  household distances with supervision of family, to promote circulation, activity tolerance, and strength maintenance.       Pertinent Vitals/Pain Pain Assessment Pain Assessment: 0-10 Pain Score: 6  Pain Location: neck, back Pain Descriptors / Indicators: Grimacing, Guarding, Sore Pain Intervention(s): Monitored during session, Limited activity within patient's tolerance, Repositioned    Home Living                           Prior Function            PT Goals (current goals can now be found in the care plan section) Acute Rehab PT Goals Patient Stated Goal: to return home PT Goal Formulation: With patient Time For Goal Achievement: 03/28/24 Potential to Achieve Goals: Good Progress towards PT goals: Progressing toward goals    Frequency    Min 5X/week      PT Plan      Co-evaluation              AM-PAC PT "6 Clicks" Mobility   Outcome Measure  Help needed turning from your back to your side while in a flat bed without using bedrails?: A Little Help needed moving from lying on your back to sitting on the side of a flat bed without using bedrails?: A Little Help needed moving to and from a bed to a chair (including a wheelchair)?: A Little Help needed standing up from a chair using your arms (e.g., wheelchair or bedside chair)?: A Little Help needed to walk in hospital room?: A Little Help needed climbing 3-5 steps with a railing? : A Little 6 Click Score: 18    End of Session Equipment Utilized During Treatment: Cervical collar Activity Tolerance: Patient limited by lethargy;Patient limited by pain Patient left: in chair;with call bell/phone within reach;with chair alarm set Nurse Communication: Mobility status PT Visit Diagnosis: Difficulty in walking, not elsewhere classified (R26.2);Unsteadiness on feet (R26.81)     Time: 2956-2130 PT Time Calculation (min) (ACUTE ONLY): 16 min  Charges:    $Therapeutic Activity: 8-22 mins PT General Charges $$ ACUTE PT VISIT: 1 Visit                     Shirlene Doughty, PT DPT Acute Rehabilitation Services Secure Chat Preferred  Office [phone]    Jaziel Bennett Cydney Draft 03/15/2024, 10:56 AM

## 2024-03-15 NOTE — Progress Notes (Signed)
 Occupational Therapy Treatment Patient Details Name: [name] MRN: [id number] DOB: 16-Oct-1961 Today's Date: 03/15/2024   History of present illness Patient is a 63 year old male with cervical myelopathy s/p ACDF C4-6. PMH: morbid obesity, PE, DVT, AKI, chronic pain, anemia, arthritis, CHF, DM   OT comments  Pt progressing well towards goals. Pt progressed to complete standing grooming tasks at CGA. Pt requiring cues to maintain upright position, and to keep RW close during mobility.  Pt re-educated on cervical handout and compensatory strategies for LB ADLs. Pt continues to be limited by decreased ROM and activity tolerance. Continue to recommend HHOT to optimize independence levels. Will continue to follow acutely.       If plan is discharge home, recommend the following:  A little help with bathing/dressing/bathroom;Assistance with cooking/housework;Assist for transportation   Equipment Recommendations  None recommended by OT    Recommendations for Other Services      Precautions / Restrictions Precautions Precautions: Fall;Cervical Precaution Booklet Issued: Yes (comment) Recall of Precautions/Restrictions: Intact Required Braces or Orthoses: Cervical Brace Cervical Brace: Hard collar;At all times Restrictions Weight Bearing Restrictions Per Provider Order: No       Mobility Bed Mobility               General bed mobility comments: in recliner on entry and exit    Transfers Overall transfer level: Needs assistance Equipment used: Rolling walker (2 wheels) Transfers: Sit to/from Stand Sit to Stand: Contact guard assist           General transfer comment: Increased effort and time, no physical assist provided     Balance Overall balance assessment: Needs assistance Sitting-balance support: Feet supported Sitting balance-Leahy Scale: Good     Standing balance support: Bilateral upper extremity supported Standing balance-Leahy Scale:  Poor Standing balance comment: Reliant on external support     ADL either performed or assessed with clinical judgement   ADL Overall ADL's : Needs assistance/impaired Eating/Feeding: Independent;Sitting   Grooming: Wash/dry hands;Contact guard assist;Standing Grooming Details (indicate cue type and reason): CGA for balance, pt reliant on sink to support UE             Lower Body Dressing: Minimal assistance;Sit to/from stand Lower Body Dressing Details (indicate cue type and reason): Pt able to thread legs with min assist or increased time Toilet Transfer: Contact guard assist;Ambulation;Rolling walker (2 wheels) Toilet Transfer Details (indicate cue type and reason): Simulated in room, cues to maintain upright posture         Functional mobility during ADLs: Contact guard assist;Rolling walker (2 wheels) General ADL Comments: Re-educated pt on handout and compensatory strategies for ADLs    Extremity/Trunk Assessment Upper Extremity Assessment Upper Extremity Assessment: Right hand dominant;RUE deficits/detail RUE Deficits / Details: R shoulder pain post op per pt. shoulder flex and abduct to 90* though pain indicated. Pt c/o of muscle spasm RUE: Shoulder pain with ROM   Lower Extremity Assessment Lower Extremity Assessment: Defer to PT evaluation        Vision   Vision Assessment?: No apparent visual deficits         Communication Communication Communication: No apparent difficulties   Cognition Arousal: Alert Behavior During Therapy: WFL for tasks assessed/performed Cognition: No apparent impairments       OT - Cognition Comments: Pt awake and alert but presents as lethargic, slow processing speed       Following commands: Intact        Cueing   Cueing Techniques:  Verbal cues        General Comments Pt on 2L O2 upon entry sustaining O2 levels on RA during mobility, replaced 2L O2 at pt's request    Pertinent Vitals/ Pain       Pain  Assessment Pain Assessment: Faces Faces Pain Scale: Hurts even more Pain Location: neck Pain Descriptors / Indicators: Grimacing, Guarding, Sore Pain Intervention(s): Monitored during session   Frequency  Min 2X/week        Progress Toward Goals  OT Goals(current goals can now be found in the care plan section)  Progress towards OT goals: Progressing toward goals  Acute Rehab OT Goals Patient Stated Goal: To be in less pain OT Goal Formulation: With patient Time For Goal Achievement: 03/28/24 Potential to Achieve Goals: Good ADL Goals Pt Will Perform Lower Body Bathing: with modified independence;sit to/from stand;with adaptive equipment;sitting/lateral leans Pt Will Perform Lower Body Dressing: with modified independence;sit to/from stand;sitting/lateral leans;with adaptive equipment Pt Will Transfer to Toilet: with modified independence;ambulating  Plan         AM-PAC OT "6 Clicks" Daily Activity     Outcome Measure   Help from another person eating meals?: None Help from another person taking care of personal grooming?: None Help from another person toileting, which includes using toliet, bedpan, or urinal?: A Little Help from another person bathing (including washing, rinsing, drying)?: A Little Help from another person to put on and taking off regular upper body clothing?: A Little Help from another person to put on and taking off regular lower body clothing?: A Little 6 Click Score: 20    End of Session Equipment Utilized During Treatment: Rolling walker (2 wheels);Cervical collar  OT Visit Diagnosis: Unsteadiness on feet (R26.81);Other abnormalities of gait and mobility (R26.89);Pain Pain - Right/Left: Right Pain - part of body: Shoulder   Activity Tolerance Patient tolerated treatment well   Patient Left in chair;with call bell/phone within reach;with chair alarm set   Nurse Communication Mobility status        Time: 1610-9604 OT Time Calculation  (min): 13 min  Charges: OT General Charges $OT Visit: 1 Visit OT Treatments $Self Care/Home Management : 8-22 mins  Delmer Ferraris, OT  Acute Rehabilitation Services Office [phone] Secure chat preferred   Mickael Alamo 03/15/2024, 8:01 AM

## 2024-03-15 NOTE — Progress Notes (Signed)
    Providing Compassionate, Quality Care - Together   NEUROSURGERY PROGRESS NOTE     S: No issues overnight.    O: EXAM:  BP 136/68   Pulse 84   Temp 99.3 F (37.4 C) (Oral)   Resp 17   Ht 5\' 10"  (1.778 m)   Wt (!) 161 kg   SpO2 98%   BMI 50.94 kg/m     Awake, alert, oriented  Speech fluent, appropriate  MAEs Strength/sensation grossly intact Dressing c/d/i   ASSESSMENT:  63 y.o. with cervical myelopathy s/p ACDF C4-6     PLAN: -Cont therapies as tolerated -Cont supportive care -Call w/ questions/concerns.   [place], [facility]

## 2024-03-16 LAB — GLUCOSE, CAPILLARY
Glucose-Capillary: 142 mg/dL — ABNORMAL HIGH (ref 70–99)
Glucose-Capillary: 143 mg/dL — ABNORMAL HIGH (ref 70–99)

## 2024-03-16 MED ORDER — METHOCARBAMOL 500 MG PO TABS: 500.0000 mg | ORAL_TABLET | Freq: Four times a day (QID) | ORAL | 2 refills | Status: DC | PRN

## 2024-03-16 MED ORDER — DOCUSATE SODIUM 100 MG PO CAPS: 100.0000 mg | ORAL_CAPSULE | Freq: Two times a day (BID) | ORAL | 0 refills | Status: DC

## 2024-03-16 MED ORDER — OXYCODONE HCL 5 MG PO TABS: 5.0000 mg | ORAL_TABLET | ORAL | 0 refills | Status: DC | PRN

## 2024-03-16 NOTE — Plan of Care (Signed)
  Problem: Activity: Goal: Risk for activity intolerance will decrease Outcome: Progressing   Problem: Coping: Goal: Level of anxiety will decrease Outcome: Progressing   Problem: Elimination: Goal: Will not experience complications related to bowel motility Outcome: Progressing

## 2024-03-16 NOTE — TOC Transition Note (Addendum)
 Transition of Care [facility]) - Discharge Note   Patient Details  Name: [name] MRN: [id number] Date of Birth: 11-07-1960  Transition of Care [facility]) CM/SW Contact:  Jannine Meo, RN Phone Number: 03/16/2024, 9:47 AM   Clinical Narrative:   Discharge orders noted. Katina with [facility] made aware. Secure message sent to provider for [facility] orders.    Final next level of care: Home w Home Health Services Barriers to Discharge: No Barriers Identified   Patient Goals and CMS Choice     Choice offered to / list presented to : Patient      Discharge Placement                       Discharge Plan and Services Additional resources added to the After Visit Summary for     Discharge Planning Services: CM Consult                      HH Arranged: PT, OT HH Agency: [facility] Date [facility] Agency Contacted: 03/14/24 Time HH Agency Contacted: 1507 Representative spoke with at [facility] Agency: Loetta Ringer  Social Drivers of Health (SDOH) Interventions SDOH Screenings   Food Insecurity: No Food Insecurity (03/13/2024)  Housing: Low Risk  (03/13/2024)  Transportation Needs: No Transportation Needs (03/13/2024)  Utilities: Not At Risk (03/13/2024)  Financial Resource Strain: Not on File (02/10/2022)   Received from [place], [place]  Physical Activity: Not on File (02/10/2022)   Received from [place], [place]  Social Connections: Not on File (07/06/2023)   [facility] from [facility]  Stress: Not on File (02/10/2022)   Received from [place], [place]  Tobacco Use: Low Risk  (03/13/2024)     Readmission Risk Interventions    05/15/2023   11:25 AM 11/23/2022    9:16 AM 06/29/2021   11:43 AM  Readmission Risk Prevention Plan  Transportation Screening Complete Complete Complete  PCP or Specialist Appt within 3-5 Days Complete    HRI or Home Care Consult Complete    Social Work Consult for Recovery Care Planning/Counseling Complete    Palliative Care Screening Not Applicable    Medication  Review Oceanographer) Complete Complete Complete  PCP or Specialist appointment within 3-5 days of discharge  Complete Complete  HRI or Home Care Consult  Complete Complete  SW Recovery Care/Counseling Consult  Complete Complete  Palliative Care Screening  Not Applicable Not Applicable  Skilled Nursing Facility  Not Applicable Not Applicable

## 2024-03-16 NOTE — Progress Notes (Addendum)
 Patient called out c/o SOB, reports feeling swollen.   Refused discharge. Primary RN notified.   O2 found on the floor Spo2 90%. [place] reapplied at 4 LNC, Spo2 99%.

## 2024-03-16 NOTE — Discharge Summary (Signed)
 Patient ID: [name] MRN: [id number] DOB/AGE: 28-Sep-1961 63 y.o.  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 male who was admitted following an ACDF C4-6 . They were recovered in PACU and transferred to the floor. Hospital course was uncomplicated. Pt stable for discharge today. Pt to f/u in office for routine post op visit. Pt is in agreement w/ plan.    Discharge Exam: Blood pressure (!) 161/70, pulse 66, temperature 97.8 F (36.6 C), resp. rate 18, height 5\' 10"  (1.778 m), weight (!) 161 kg, SpO2 95%. A&O Speech fluent, appropriate Strength/sensation grossly intact BUE/BLE.  Dressing c/d/I.  Collar in place  Disposition: Discharge disposition: 01-Home or Self Care       Discharge Instructions     Incentive spirometry RT   Complete by: As directed       Allergies as of 03/16/2024       Reactions   Coconut Fatty Acid Hives   Coconut Flavor [flavoring Agent (non-screening)] Hives   Ibuprofen Other (See Comments)   Made gastric ulcers worse   Aleve  [naproxen ] Other (See Comments)   DUE TO KIDNEYS   Nsaids Other (See Comments)   Stomach ulcers   Vascepa  [icosapent  Ethyl (epa Ethyl Ester) (fish)] Nausea And Vomiting, Other (See Comments)   headaches, chest pain, similar to sx of a stroke, hypotension    Other Hives, Rash   Nut Allergy        Medication List     PAUSE taking these medications    Eliquis  5 MG Tabs tablet Wait to take this until: Mar 20, 2024 Generic drug: apixaban  TAKE 1 TABLET BY MOUTH TWICE A DAY       STOP taking these medications    tiZANidine  4 MG tablet Commonly known as: ZANAFLEX        TAKE these medications    albuterol  108 (90 Base) MCG/ACT inhaler Commonly known as: VENTOLIN  HFA Inhale 2  puffs into the lungs every 6 (six) hours as needed for wheezing or shortness of breath.   atorvastatin  80 MG tablet Commonly known as: LIPITOR  Take 80 mg by mouth daily.   B-complex with vitamin C tablet Take 1 tablet by mouth daily.   BIOFREEZE EX Apply 1 application  topically as needed (knee pain).   carvedilol  6.25 MG tablet Commonly known as: COREG  Take 6.25 mg by mouth 2 (two) times daily.   Combivent  Respimat 20-100 MCG/ACT Aers respimat Generic drug: Ipratropium-Albuterol  Inhale 1 puff into the lungs 4 (four) times daily.   docusate sodium  100 MG capsule Commonly known as: COLACE Take 1 capsule (100 mg total) by mouth 2 (two) times daily.   dorzolamide-timolol  2-0.5 % ophthalmic solution Commonly known as: COSOPT Place 1 drop into both eyes 2 (two) times daily.   fenofibrate  145 MG tablet Commonly known as: TRICOR  TAKE 1 TABLET (145 MG TOTAL) BY MOUTH DAILY.   ferrous sulfate  325 (65 FE) MG tablet Take 1 tablet (325 mg total) by mouth daily.   furosemide  40 MG tablet Commonly known as: LASIX  Take 40-80 mg by mouth See admin instructions. Take 80 mg by mouth in the morning and 40 mg at night   gabapentin  800 MG tablet Commonly known as: NEURONTIN  Take 800 mg by mouth 3 (three) times daily.   glipiZIDE  10 MG tablet Commonly known as:  GLUCOTROL  Take 10 mg by mouth 2 (two) times daily.   HumuLIN  R U-500 KwikPen 500 UNIT/ML KwikPen Generic drug: insulin  regular human CONCENTRATED Inject 95-120 Units into the skin in the morning, at noon, and at bedtime. Per sliding scale 3 times daily with meals   Insulin  Pen Needle 31G X 5 MM Misc Use 1 needle daily to inject insulin  as prescribed   isosorbide  dinitrate 30 MG tablet Commonly known as: ISORDIL  Take 30 mg by mouth 2 (two) times daily.   lisinopril  20 MG tablet Commonly known as: ZESTRIL  Take 20 mg by mouth daily.   methocarbamol  500 MG tablet Commonly known as: ROBAXIN  Take 1 tablet (500 mg total) by  mouth every 6 (six) hours as needed for muscle spasms.   nitroGLYCERIN  0.4 MG SL tablet Commonly known as: NITROSTAT  Place 1 tablet (0.4 mg total) under the tongue every 5 (five) minutes as needed for chest pain.   ondansetron  4 MG tablet Commonly known as: ZOFRAN  Take 4 mg by mouth as needed for nausea/vomiting, vomiting or nausea.   Oxycodone  HCl 20 MG Tabs Take 20 mg by mouth every 6 (six) hours as needed (pain). What changed: Another medication with the same name was added. Make sure you understand how and when to take each.   oxyCODONE  5 MG immediate release tablet Commonly known as: Oxy IR/ROXICODONE  Take 1 tablet (5 mg total) by mouth every 4 (four) hours as needed for moderate pain (pain score 4-6). What changed: You were already taking a medication with the same name, and this prescription was added. Make sure you understand how and when to take each.   pantoprazole  40 MG tablet Commonly known as: PROTONIX  Take 40 mg by mouth daily.   potassium chloride  10 MEQ CR capsule Commonly known as: MICRO-K  Take 2 capsules (20 mEq total) by mouth 2 (two) times daily.   potassium chloride  SA 20 MEQ tablet Commonly known as: KLOR-CON  M Take 20 mEq by mouth 2 (two) times daily.   sertraline  50 MG tablet Commonly known as: ZOLOFT  Take 50 mg by mouth every morning.   SSD 1 % cream Generic drug: silver sulfADIAZINE Apply 1 Application topically daily as needed (open wounds).   traZODone  50 MG tablet Commonly known as: DESYREL  Take 1 tablet (50 mg total) by mouth at bedtime.   triamcinolone  cream 0.1 % Commonly known as: KENALOG  Apply 1 Application topically daily as needed (itching).   Vitamin D  (Ergocalciferol ) 1.25 MG (50000 UNIT) Caps capsule Commonly known as: DRISDOL Take 50,000 Units by mouth every Monday.        Follow-up Information     Valrie Gehrig, MD Follow up.   Specialty: Family Medicine Contact information: [address] [place]  [address] [phone]         [facility] Follow up.   Specialty: Home Health Services Why: home health services will be provided by [facility], start of care with 48 hours  podt discharge Contact information: [address] [place] [address] [phone]                 Signed: Agron Swiney CAYLIN Cedrik Heindl 03/16/2024, 9:11 AM

## 2024-03-16 NOTE — Progress Notes (Signed)
 Physical Therapy Treatment Patient Details Name: [name] MRN: [id number] DOB: 13-Mar-1961 Today's Date: 03/16/2024   History of Present Illness Patient is a 63 year old male with cervical myelopathy s/p ACDF C4-6. PMH: morbid obesity, PE, DVT, AKI, chronic pain, anemia, arthritis, CHF, DM    PT Comments  Pt reports he feels more SOB and swollen today. Amb short distances with SpO2 88% briefly after first bout of gait and then 98% on RA after second bout of gait. Son present to transport home.      If plan is discharge home, recommend the following: A little help with bathing/dressing/bathroom;Assistance with cooking/housework;Assist for transportation   Can travel by private vehicle        Equipment Recommendations  None recommended by PT    Recommendations for Other Services       Precautions / Restrictions Precautions Precautions: Fall;Cervical Precaution Booklet Issued: Yes (comment) Recall of Precautions/Restrictions: Intact Required Braces or Orthoses: Cervical Brace Cervical Brace: Hard collar;At all times Restrictions Weight Bearing Restrictions Per Provider Order: No     Mobility  Bed Mobility               General bed mobility comments: Pt up in chair    Transfers Overall transfer level: Modified independent Equipment used: Rollator (4 wheels), Rolling walker (2 wheels) Transfers: Sit to/from Stand Sit to Stand: Modified independent (Device/Increase time)           General transfer comment: Incr time to perform    Ambulation/Gait Ambulation/Gait assistance: Supervision Gait Distance (Feet): 40 Feet (30' x 1, 40' x 1) Assistive device: Rolling walker (2 wheels), Rollator (4 wheels) Gait Pattern/deviations: Step-through pattern, Trunk flexed, Wide base of support Gait velocity: decr Gait velocity interpretation: <1.31 ft/sec, indicative of household ambulator   General Gait Details: DOE 2/4   Research scientist (life sciences) Bed    Modified Rankin (Stroke Patients Only)       Balance Overall balance assessment: Needs assistance Sitting-balance support: No upper extremity supported, Feet supported Sitting balance-Leahy Scale: Good     Standing balance support: Bilateral upper extremity supported, During functional activity, Reliant on assistive device for balance Standing balance-Leahy Scale: Poor Standing balance comment: Reliant on external support                            Communication Communication Communication: No apparent difficulties  Cognition Arousal: Alert Behavior During Therapy: Flat affect   PT - Cognitive impairments: No apparent impairments                         Following commands: Intact      Cueing    Exercises      General Comments General comments (skin integrity, edema, etc.): DOE 2/4. SpO2 88% on RA after first amb and then 98% after second bout of amb      Pertinent Vitals/Pain Pain Assessment Pain Assessment: Faces Faces Pain Scale: Hurts little more Pain Location: neck Pain Descriptors / Indicators: Grimacing, Guarding, Sore Pain Intervention(s): Limited activity within patient's tolerance, Monitored during session    Home Living                          Prior Function            PT Goals (  current goals can now be found in the care plan section) Progress towards PT goals: Progressing toward goals    Frequency    Min 5X/week      PT Plan      Co-evaluation              AM-PAC PT "6 Clicks" Mobility   Outcome Measure  Help needed turning from your back to your side while in a flat bed without using bedrails?: A Little Help needed moving from lying on your back to sitting on the side of a flat bed without using bedrails?: A Little Help needed moving to and from a bed to a chair (including a wheelchair)?: None Help needed standing up from a chair using your arms (e.g., wheelchair or  bedside chair)?: None Help needed to walk in hospital room?: A Little Help needed climbing 3-5 steps with a railing? : A Little 6 Click Score: 20    End of Session Equipment Utilized During Treatment: Cervical collar Activity Tolerance: Patient tolerated treatment well Patient left: in chair;with call bell/phone within reach;with chair alarm set;with family/visitor present Nurse Communication: Mobility status PT Visit Diagnosis: Difficulty in walking, not elsewhere classified (R26.2);Unsteadiness on feet (R26.81)     Time: 1345-1400 PT Time Calculation (min) (ACUTE ONLY): 15 min  Charges:    $Gait Training: 8-22 mins PT General Charges $$ ACUTE PT VISIT: 1 Visit                     [facility] PT Acute Rehabilitation Services Office [phone]    Pura Browns [facility] 03/16/2024, 2:12 PM

## 2024-03-19 ENCOUNTER — Ambulatory Visit: Payer: Self-pay

## 2024-03-19 ENCOUNTER — Telehealth: Payer: Self-pay

## 2024-03-19 NOTE — Telephone Encounter (Signed)
 Chief Complaint: rectal bleeding Symptoms: dizziness, bloody stools, nausea Frequency: today Pertinent Negatives: Patient denies CP or SOB (today) Disposition: [x] ED /[] Urgent Care (no appt availability in office) / [] Appointment(In office/virtual)/ []  [place] [facility]/ [] Home Care/ [] Refused Recommended Disposition /[] [place] [facility]/ []  Follow-up with PCP Additional Notes: Pt reports 2 episodes of bloody stool today. Pt states the blood is dark with clots and was present in the stool and on the tissue. Pt denies blood that filled the toilet or turned it red or pink. Pt was discharged 5/24. Pt states he was dizzy then while in the hospital and is still dizzy. Pt states dizziness is worsening. Denies abdominal pain but endorses nausea w/out vomiting. Pt reports he had SOB and CP last night but it has since resolved.Pt takes Eliquis . RN advised pt he needs to go to the ED. Pt agreeable but is alone with no ride. RN called 911 for the pt. When RN returned to the call with the pt, it was the pt's home health aide that was on the other line. She just arrived to the pt's house and states he is currently in the bathroom. RN asked aide to ensure door remains unlocked, she verbalized understanding.     Copied from CRM [id number]. Topic: Clinical - Red Word Triage >> Mar 19, 2024 12:26 PM Carrielelia G wrote: Red Word that prompted transfer to Nurse Triage: Nurse Sallyanne Creamer called and would like for you to call patient [place], although she stated he said he could wait, she would not be doing her job, so she ask that you please call him regarding rectal bleeding. Reason for Disposition  Taking Coumadin (warfarin) or other strong blood thinner, or known bleeding disorder (e.g., thrombocytopenia)  Answer Assessment - Initial Assessment Questions 1. APPEARANCE of BLOOD: "What color is it?" "Is it passed separately, on the surface of the stool, or mixed in with the stool?"      Dark with clots, mixed in  with stool  2. AMOUNT: "How much blood was passed?"      "It fills the toilet paper", denies that it turns the water pink or red 3. FREQUENCY: "How many times has blood been passed with the stools?"      2x 4. ONSET: "When was the blood first seen in the stools?" (Days or weeks)      Today  5. DIARRHEA: "Is there also some diarrhea?" If Yes, ask: "How many diarrhea stools in the past 24 hours?"      Since he was discharged from the hospital on 5/24; 4-5x/day, states it is watery  6. CONSTIPATION: "Do you have constipation?" If Yes, ask: "How bad is it?"     No  7. RECURRENT SYMPTOMS: "Have you had blood in your stools before?" If Yes, ask: "When was the last time?" and "What happened that time?"      No  8. BLOOD THINNERS: "Do you take any blood thinners?" (e.g., Coumadin/warfarin, Pradaxa/dabigatran, aspirin )     Eliquis  9. OTHER SYMPTOMS: "Do you have any other symptoms?"  (e.g., abdomen pain, vomiting, dizziness, fever)     Denies abdominal pain, denies vomiting. Endorses some nausea, but none today. Endorses dizziness. Pt states he was dizzy in the hospital but is still dizzy now and it's getting worse. States he can still stand and walk. Endorses both CP and difficulty breathing last night. "A lot of SOB and it made my chest hurt, I was coughing a lot." States CP was "hard to describe." States  his cough is dry. Endorses chills  Protocols used: Rectal Bleeding-A-AH

## 2024-03-19 NOTE — Telephone Encounter (Signed)
Message routed to PCP Beamer, Victoria, MD  

## 2024-03-20 NOTE — Transitions of Care (Post Inpatient/ED Visit) (Signed)
 03/20/2024  Name: [name] MRN: [id number] DOB: 06/26/1961  Today's TOC FU Call Status: Today's TOC FU Call Status:: Successful TOC FU Call Completed TOC FU Call Complete Date: 03/19/24 Patient's Name and Date of Birth confirmed.  Transition Care Management Follow-up Telephone Call Date of Discharge: 03/13/24 Discharge Facility: [facility]) Type of Discharge: Inpatient Admission Primary Inpatient Discharge Diagnosis:: Cervical spondylosis with myelopathy How have you been since you were released from the hospital?: Better Any questions or concerns?: Yes Patient Questions/Concerns:: Patient asking about scheduled appointments  Items Reviewed: Did you receive and understand the discharge instructions provided?: Yes Medications obtained,verified, and reconciled?: Yes (Medications Reviewed) Any new allergies since your discharge?: No Dietary orders reviewed?: Yes Type of Diet Ordered:: Heart healthy Do you have support at home?: Yes People in Home [RPT]: child(ren), adult Name of Support/Comfort Primary Source: Home Confidential PCA 4.5 hours a day 3days/week and son "every now and then"  Medications Reviewed Today: Medications Reviewed Today   Medications were not reviewed in this encounter     Home Care and Equipment/Supplies: Were Home Health Services Ordered?: Yes Name of Home Health Agency:: [facility] Has Agency set up a time to come to your home?: No EMR reviewed for Home Health Orders: Orders present/patient has not received call (refer to CM for follow-up) (Conference call placed to [phone]-staff, Wallene Gum who states they will plan  at the home Thursday 03/21/24) Any new equipment or medical supplies ordered?: No Name of Medical supply agency?: error - patient states he has all the equipment he needs Were you able to get the equipment/medical supplies?: No (NA) Do you have any questions related to the use of the equipment/supplies?: No  (NA)  Functional Questionnaire: Do you need assistance with bathing/showering or dressing?: Yes (PCA assists as needed) Do you need assistance with meal preparation?: Yes (PCA assists as needed) Do you need assistance with eating?: No Do you have difficulty maintaining continence: No Do you need assistance with getting out of bed/getting out of a chair/moving?: No (sleeps in a recliner due to back pain) Do you have difficulty managing or taking your medications?: No  Follow up appointments reviewed: PCP Follow-up appointment confirmed?: No (Called PCP office related rectal bleeding reported by patient) Specialist Hospital Follow-up appointment confirmed?: No Reason Specialist Follow-Up Not Confirmed:  (Called surgeon's office regarding rectal bleeding reported by patient) Do you need transportation to your follow-up appointment?: No Do you understand care options if your condition(s) worsen?: Yes-patient verbalized understanding  SDOH Interventions Today    Flowsheet Row Most Recent Value  SDOH Interventions   Food Insecurity Interventions Intervention Not Indicated  Housing Interventions Intervention Not Indicated  Transportation Interventions Other (Comment)  [patient states he is talking to his son about transportation and at this timeis not sure if he wants/needs to talk with SW]  Utilities Interventions Intervention Not Indicated       Goals Addressed             This Visit's Progress    VBCI Transitions of Care (TOC) Care Plan       Problems:  Recent Hospitalization for treatment of Cervical spondylosis with myelopathy s/p ACDF C4-6  During initial call, patient put [facility] RN on hold to go to the bathroom and upon returning to the phone said he noted dark red blood after bowel movement - 03/20/24 update - TOC RN saw in notes in EPIC that triage nurse called 911 for patient to go to ED - TOC  RN called patient this morning who states the paramedics came and he declined to go.  Today, 03/20/24 patient states he has taken none of his medications since his discharge - He confirms he is not taking the Eliquis  or any other medications but does not give a reason as to why except stating he has not had an appetite. Patient states he thinks he needs to move his bowels now - [facility] RN advised patient of the importance of getting checked with rectal bleeding. Patient agreed to call [facility] RN back after he moves his bowels and sees further bleeding. Epic now reflects patient is with [facility] in [facility] on [address], which [facility] RN typically does not follow - Call placed and message left advising patient has rectal bleeding and is taking none of his medications. 03/20/24 Update 9:30am Patient states he moved his bowels and feels the bleeding is resolving and states he only saw a small amount of blood - TOC RN discussed patient going to ED again and patient continues to decline - TOC RN asked patient why he is choosing not to take his medications and patient states because he is not eating anything.   Goal:  Over the next 30 days, the patient will not experience hospital readmission-After outreach - [facility] RN discovered listed PCP is no longer with [facility] and is now with [facility] who is not part of TOC program   Interventions:  Transitions of Care: Doctor Visits  - discussed the importance of doctor visits Communication with [facility] regarding start date, Surgeon and PCP office re: report by patient of dark red blood after bowel movement - 03/20/24 update - patient states the nurse from [facility] is coming to see him today - TOC RN encouraged patient to be sure to tell the home health nurse about the rectal bleed and that he is not taking any of his medications.  Educated patient that PCP and surgeon's office were notified of his reported dark red blood after BM and that messages were left with both offices. Educated patient to seek medical  treatment if this continues or worsens or as advised by PCP or surgeon's office. Patient confirmed Eliquis  is still on hold and currently states he does not feel he needs to go to ED. - 03/20/24 Per Epic note-triage nurse called 911 and sent paramedics to patient's home and patient refused to go to ED - Patient advised TOC RN this morning that his PCP, Valrie Gehrig left [facility] and is now with [facility] in [facility] [address] which is not a part of TOC Program -[facility] RN called [facility] 03/20/24 and left message advising of patient's rectal bleed and not taking any of his medications. Update 03/20/24 9:29am  TOC RN made follow up call to patient who reported he did move his bowels and reports a small amount of dark red blood noted and he thinks for now it seems to be improving. Patient agreed to allow [facility] RN to call back to [facility] and another message was left for triage nurse that patient reports rectal bleed and not taking his medications since his discharge 03/16/24 - patient states he does not have an appetite and so is not taking his medications and states he takes BP medications and feels if he is not eating and takes his medications then his BP will drop-TOC RN again advised  patient of concern of rectal bleeding and not taking medications and encouraged patient to follow up with PCP and/or go to ED with continued or worsening symptoms and advised patient that [facility] program does not follow [facility] so this will be TOC RN's last call and hope is that patient will either go to ED or be seen by MD. Patient advised TOC RN that a friend has arrived and states he understands the instruction     Diabetes Interventions: Assessed patient's understanding of A1c goal: <7% Focus of initial call was related to patient reporting rectal bleed Lab Results  Component Value Date   HGBA1C 9.1 (H) 03/04/2024     Surgery (Cervical spondylosis with myelopathy - ACDF C4-6): Evaluation of current treatment plan related to (Cervical spondylosis with myelopathy - ACDF C4-6) surgery contacted provider to discuss patient report of rectal bleeding - stating he hasn't had rectal bleeding in years and states it's a dark red color  Patient Self Care Activities:  Attend all scheduled provider appointments Call pharmacy for medication refills 3-7 days in advance of running out of medications Call provider office for new concerns or questions  Notify RN Care Manager of TOC call rescheduling needs Participate in Transition of Care Program/Attend TOC scheduled calls Take medications as prescribed   Seek medical attention if further bleeding, or worsening bleeding is noted and/or directed by PCP or surgeon.   Plan:    Update 03/20/24 9:29am  TOC RN made follow up call to patient who reported he did move his bowels and reports a small amount of dark red blood noted and he thinks for now it seems to be improving. Patient agreed to allow [facility] RN to call back to [facility] and another message was left for triage nurse that patient reports rectal bleed and not taking his medications since his discharge 03/16/24 - patient states he does not have an appetite and so is not taking his medications and states he takes BP medications and feels if he is not eating and takes his medications then his BP will drop-TOC RN again advised patient of concern of rectal bleeding and not taking medications and encouraged patient to follow up with PCP and/or go to ED with continued or worsening symptoms and advised patient that [facility] program does not follow [facility] so this will be TOC RN's last call and hope is that patient will either go to ED or be seen by MD. Patient advised [facility] RN that a friend has arrived and states he understands the instruction     Telephone follow up appointment with care  management team member scheduled for:  No appointment was scheduled - TOC RN requested patient notify [facility] RN if/when he receives call from PCP/Surgeon's office and if [facility] RN does not hear from him, [facility] RN will call him back - 03/20/24 Update - TOC RN called patient just after 8am 03/20/24 - patient advised TOC RN that he declined to go to hospital with paramedics yesterday. Patient states he feels he needs to have a BM and states he will call [facility] RN and advise if he sees blood in stool this morning -  The patient has been provided with contact information for the care management team and has been advised to call with any health related questions or concerns.        Case was reviewed with supervisor, TOC RN will close [facility] Program - PCP office was notified  of  concerns. TOC Program does not follow [facility] Tonia Frankel RN, CCM [place]  VBCI-Population Health RN Care Manager [phone]

## 2024-04-11 ENCOUNTER — Ambulatory Visit: Admitting: Podiatry

## 2024-04-22 ENCOUNTER — Encounter (INDEPENDENT_AMBULATORY_CARE_PROVIDER_SITE_OTHER): Payer: Self-pay

## 2024-04-24 ENCOUNTER — Other Ambulatory Visit: Payer: Self-pay | Admitting: Student

## 2024-04-24 DIAGNOSIS — K921 Melena: Secondary | ICD-10-CM

## 2024-04-25 ENCOUNTER — Ambulatory Visit
Admission: RE | Admit: 2024-04-25 | Discharge: 2024-04-25 | Disposition: A | Discharge status: Home / Self Care | Source: Ambulatory Visit | Attending: Student | Admitting: Student

## 2024-04-25 DIAGNOSIS — K921 Melena: Secondary | ICD-10-CM

## 2024-04-25 MED ORDER — IOPAMIDOL (ISOVUE-300) INJECTION 61%
100.0000 mL | Freq: Once | INTRAVENOUS | Status: AC | PRN
  Administered 2024-04-25: 100 mL via INTRAVENOUS

## 2024-04-26 ENCOUNTER — Ambulatory Visit: Payer: Self-pay | Admitting: Student

## 2024-05-06 ENCOUNTER — Ambulatory Visit (INDEPENDENT_AMBULATORY_CARE_PROVIDER_SITE_OTHER): Admitting: Podiatry

## 2024-05-06 DIAGNOSIS — M79675 Pain in left toe(s): Secondary | ICD-10-CM

## 2024-05-06 DIAGNOSIS — N179 Acute kidney failure, unspecified: Secondary | ICD-10-CM

## 2024-05-06 DIAGNOSIS — E1142 Type 2 diabetes mellitus with diabetic polyneuropathy: Secondary | ICD-10-CM

## 2024-05-06 DIAGNOSIS — B351 Tinea unguium: Secondary | ICD-10-CM

## 2024-05-06 DIAGNOSIS — M79674 Pain in right toe(s): Secondary | ICD-10-CM

## 2024-05-06 NOTE — Progress Notes (Signed)
This patient presents  to my office for at risk foot care.  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  This patient presents for at risk foot care today.  General Appearance  Alert, conversant and in no acute stress.  Vascular  Dorsalis pedis   pulses are absent bilaterally. Posterior tibial pulses are absent due to swelling. Capillary return is within normal limits  bilaterally. Temperature is within normal limits  bilaterally.  Venous stasis  B/L.  Neurologic  Senn-Weinstein monofilament wire test within normal limits  bilaterally. Muscle power within normal limits bilaterally.  Nails Thick disfigured discolored nails with subungual debris  from hallux to fifth toes bilaterally. No evidence of bacterial infection or drainage bilaterally.  Orthopedic  No limitations of motion  feet .  No crepitus or effusions noted.  No bony pathology or digital deformities noted.  Skin  dry skin with no porokeratosis noted bilaterally.  No signs of infections or ulcers noted.     Onychomycosis  Pain in right toes  Pain in left toes  Consent was obtained for treatment procedures.   Mechanical debridement of nails 1-5  bilaterally performed with a nail nipper.  Filed with dremel without incident.    Return office visit    3 months                 Told patient to return for periodic foot care and evaluation due to potential at risk complications.   Gardiner Barefoot DPM

## 2024-05-15 NOTE — Therapy (Signed)
 OUTPATIENT PHYSICAL THERAPY CERVICAL EVALUATION   Patient Name: [name] MRN: [id number] DOB:07/09/1961, 63 y.o., male Today's Date: 05/15/2024  END OF SESSION:   Past Medical History:  Diagnosis Date   Anemia    Arthritis    Back pain    CAD (coronary artery disease)    a. s/p DES to LAD in 05/2016   Cervical radiculopathy    Chronic diastolic CHF (congestive heart failure) (HCC)    Chronic pain    Deep vein thrombosis (HCC) 01/06/2016   Formatting of this note might be different from the original.  Formatting of this note might be different from the original. Last Assessment & Plan: Management per primary care. Last Assessment & Plan: Management per primary care.  Formatting of this note might be different from the original. Last Assessment & Plan: Management per primary care. Last Assessment & Plan: Management per primary care.   Depression    DVT (deep venous thrombosis) (HCC)    Dyspnea    GERD (gastroesophageal reflux disease)    Hematemesis    Hepatic steatosis    Hyperlipidemia    Hypertension    IBS (irritable bowel syndrome)    Morbid obesity (HCC)    Myocardial infarction (HCC)    OSA (obstructive sleep apnea)    oxygen  4L/Throop at night and PRN   Pancreatitis    PE (pulmonary thromboembolism) (HCC)    Peripheral neuropathy    PUD (peptic ulcer disease)    Pulmonary embolism (HCC) 07/15/2021   Renal disorder    Stroke [facility])    a. ?details unclear - not seen on imaging when he was admitted in 05/2017 for TIA symptoms which were felt due to cervical radiculopathy.   Thoracic aortic ectasia (HCC)    a. 4.3cm ectatic ascending thoracic aorta by CT 06/2017.    Type 2 diabetes mellitus (HCC)    Past Surgical History:  Procedure Laterality Date   ANTERIOR CERVICAL CORPECTOMY N/A 03/13/2024   Procedure: ANTERIOR CERVICAL DECOMPRESSION AND FUSION WITH PLATING CERVICAL FOUR-CERVICAL FIVE/CERVICAL FIVE-CERVICAL SIX;  Surgeon: Debby Dorn MATSU, MD;  Location: [facility]  OR;  Service: Neurosurgery;  Laterality: N/A;   CARDIAC CATHETERIZATION N/A 05/31/2016   Procedure: Left Heart Cath and Coronary Angiography;  Surgeon: Peter M Swaziland, MD;  Location: [facility] INVASIVE CV LAB;  Service: Cardiovascular;  Laterality: N/A;   CARDIAC CATHETERIZATION N/A 05/31/2016   Procedure: Intravascular Pressure Wire/FFR Study;  Surgeon: Peter M Swaziland, MD;  Location: [facility] INVASIVE CV LAB;  Service: Cardiovascular;  Laterality: N/A;   CARDIAC CATHETERIZATION N/A 05/31/2016   Procedure: Coronary Stent Intervention;  Surgeon: Peter M Swaziland, MD;  Location: [facility] INVASIVE CV LAB;  Service: Cardiovascular;  Laterality: N/A;   COLONOSCOPY N/A 07/17/2021   Procedure: COLONOSCOPY;  Surgeon: Dianna Specking, MD;  Location: [facility] ENDOSCOPY;  Service: Endoscopy;  Laterality: N/A;   COLONOSCOPY WITH PROPOFOL  N/A 04/29/2020   Procedure: COLONOSCOPY WITH PROPOFOL ;  Surgeon: Dianna Specking, MD;  Location: [facility] ENDOSCOPY;  Service: Endoscopy;  Laterality: N/A;   ESOPHAGOGASTRODUODENOSCOPY N/A 04/29/2020   Procedure: ESOPHAGOGASTRODUODENOSCOPY (EGD);  Surgeon: Dianna Specking, MD;  Location: [facility] ENDOSCOPY;  Service: Endoscopy;  Laterality: N/A;   ESOPHAGOGASTRODUODENOSCOPY (EGD) WITH PROPOFOL  N/A 07/17/2021   Procedure: ESOPHAGOGASTRODUODENOSCOPY (EGD) WITH PROPOFOL ;  Surgeon: Dianna Specking, MD;  Location: [facility] ENDOSCOPY;  Service: Endoscopy;  Laterality: N/A;   GIVENS CAPSULE STUDY N/A 11/10/2022   Procedure: GIVENS CAPSULE STUDY;  Surgeon: Elicia Claw, MD;  Location: [facility] ENDOSCOPY;  Service: Gastroenterology;  Laterality: N/A;  LEFT HEART CATH AND CORONARY ANGIOGRAPHY N/A 12/08/2017   Procedure: LEFT HEART CATH AND CORONARY ANGIOGRAPHY;  Surgeon: Anner Alm ORN, MD;  Location: [facility] INVASIVE CV LAB;  Service: Cardiovascular;  Laterality: N/A;   LEFT HEART CATHETERIZATION WITH CORONARY ANGIOGRAM N/A 02/03/2014   Procedure: LEFT HEART CATHETERIZATION WITH CORONARY ANGIOGRAM;  Surgeon: Vinie KYM Maxcy, MD;   Location: [facility] CATH LAB;  Service: Cardiovascular;  Laterality: N/A;   left leg stent      POLYPECTOMY  04/29/2020   Procedure: POLYPECTOMY;  Surgeon: Dianna Specking, MD;  Location: [facility] ENDOSCOPY;  Service: Endoscopy;;   POLYPECTOMY  07/17/2021   Procedure: POLYPECTOMY;  Surgeon: Dianna Specking, MD;  Location: [facility] ENDOSCOPY;  Service: Endoscopy;;   Patient Active Problem List   Diagnosis Date Noted   Cervical myelopathy (HCC) 03/13/2024   Bronchitis 07/06/2023   Chronic respiratory failure with hypoxia (HCC) 07/06/2023   Pulmonary embolism (HCC) 07/06/2023   AKI (acute kidney injury) (HCC) 05/14/2023   Paresthesias 11/22/2022   Hypotension 11/21/2022   Acute kidney injury superimposed on chronic kidney disease (HCC) 11/21/2022   GI bleeding 11/10/2022   History of pulmonary embolism 11/10/2022   Right-sided Bell's palsy 10/11/2022   Class 3 obesity 08/16/2022   Diplopia 08/15/2022   Carpal tunnel syndrome on both sides 03/14/2022   Diabetic ulcer of lower leg (HCC) 06/26/2021   Lumbar radiculitis 03/30/2021   Morbid obesity (HCC) 01/15/2021   History of colonic polyps 01/15/2021   Pain due to onychomycosis of toenails of both feet 01/15/2021   Epistaxis, recurrent 10/21/2020   Laceration of nose 10/21/2020   OSA (obstructive sleep apnea)    Bilateral carotid artery stenosis 07/28/2020   Bilateral lower extremity edema 07/28/2020   CHF (congestive heart failure) (HCC) 07/28/2020   DDD (degenerative disc disease), lumbar 07/28/2020   Diabetic peripheral neuropathy (HCC) 07/28/2020   Fatty liver 07/28/2020   Irritable bowel syndrome with diarrhea 07/28/2020   Osteoarthritis of right hip 07/28/2020   Mixed diabetic hyperlipidemia associated with type 2 diabetes mellitus (HCC) 04/27/2020   Rotator cuff arthropathy 10/30/2019   Cervical radiculopathy 09/24/2019   Trochanteric bursitis of right hip 09/24/2019   Chronic diastolic CHF (congestive heart failure) (HCC) 08/23/2019    ARF (acute renal failure) (HCC) 08/01/2019   Hyperkalemia 08/01/2019   Uncontrolled type 2 diabetes mellitus with hyperglycemia (HCC) 08/01/2019   Trochanteric bursitis 02/11/2019   Long-term insulin  use (HCC) 01/25/2019   Neck pain 10/09/2018   Left arm weakness 10/09/2018   Decreased sensation of lower extremity 09/12/2018   B12 deficiency 08/10/2017   Persistent headaches 08/09/2017   GERD (gastroesophageal reflux disease) 06/29/2017   Left arm numbness    History of CVA (cerebrovascular accident) 06/17/2017   TIA (transient ischemic attack) 06/17/2017   Degenerative joint disease involving multiple joints 04/09/2017   Foraminal stenosis of lumbar region 01/19/2017   Chronic back pain 12/29/2016   Depression 12/15/2016   Vitamin D  deficiency 12/09/2016   Right hip pain 12/07/2016   Hypokalemia 12/07/2016   Type 2 diabetes mellitus with vascular disease (HCC) 05/31/2016   Normocytic normochromic anemia 05/31/2016   Chest pain 05/31/2016   Coronary artery disease involving native coronary artery of native heart without angina pectoris 01/06/2016   Chronic pancreatitis (HCC) 03/21/2014   Edema 03/21/2014   Chest pain, non-cardiac 01/29/2014   DM2 (diabetes mellitus, type 2) (HCC) 01/29/2014   Obesity, Class III, BMI 40-49.9 (morbid obesity) 01/29/2014   Snoring 01/29/2014   Dyslipidemia 01/29/2014   HTN (hypertension)  01/29/2014   Abnormal nuclear stress test 01/29/2014   Benign essential hypertension 08/15/2013    PCP[facility]   REFERRING PROVIDER: Johnanna Camie Mates, PA-C   REFERRING DIAG: [phone] (ICD-10-CM) - Spinal stenosis, cervical region   THERAPY DIAG:  No diagnosis found.  Rationale for Evaluation and Treatment: Rehabilitation  ONSET DATE: ***  SUBJECTIVE:                                                                                                                                                                                                          SUBJECTIVE STATEMENT: *** Hand dominance: {MISC; OT HAND DOMINANCE:[phone]}  PERTINENT HISTORY:  morbid obesity, PE, DVT,  chronic pain, anemia, arthritis, CHF, DM, CAD, HTN, dyspnea, peripheral neuropathy, thoracic aortic ecstasia  PAIN:  Are you having pain? Yes: NPRS scale: *** Pain location: *** Pain description: *** Aggravating factors: *** Relieving factors: ***  PRECAUTIONS: {Therapy precautions:24002}  RED FLAGS: {PT Red Flags:29287}     WEIGHT BEARING RESTRICTIONS: {Yes ***/No:24003}  FALLS:  Has patient fallen in last 6 months? {fallsyesno:27318}  LIVING ENVIRONMENT: Lives with: {OPRC lives with:25569::lives with their family} Lives in: {Lives in:25570} Stairs: {opstairs:27293} Has following equipment at home: {Assistive devices:23999}  OCCUPATION: ***  PLOF: {PLOF:24004}  PATIENT GOALS: ***  NEXT MD VISIT: ***  OBJECTIVE:  Note: Objective measures were completed at Evaluation unless otherwise noted.  DIAGNOSTIC FINDINGS:  EXAM: CERVICAL SPINE - 2-3 VIEW   COMPARISON:  09/02/2016, MRI 10/27/2023   FINDINGS: Eight low resolution intraoperative spot views of the cervical spine. Total fluoroscopy time was 42 seconds, fluoroscopic dose of 43.13 mGy.   The images demonstrate anterior localizing instrument anterior to C5. Final images demonstrate anterior plate, fixating screws and interbody devices at the lower cervical spine. Curvilinear density on final image overlies the right lower cervical region   IMPRESSION: Intraoperative fluoroscopic assistance provided during cervical spine surgery.     Electronically Signed   By: Luke Bun M.D.   On: 03/13/2024 21:07    PATIENT SURVEYS:  {rehab surveys:24030} Cervical spondylosis with myelopathy [M47.12] Cervical myelopathy (HCC) [G95.9] COGNITION: Overall cognitive status: {cognition:24006}  SENSATION: {sensation:27233}  POSTURE:  {posture:25561}  PALPATION: ***   CERVICAL ROM:   {AROM/PROM:27142} ROM A/PROM (deg) eval  Flexion   Extension   Right lateral flexion   Left lateral flexion   Right rotation   Left rotation    (Blank rows = not tested)  UPPER EXTREMITY ROM:  {AROM/PROM:27142} ROM Right eval Left eval  Shoulder flexion  Shoulder extension    Shoulder abduction    Shoulder adduction    Shoulder extension    Shoulder internal rotation    Shoulder external rotation    Elbow flexion    Elbow extension    Wrist flexion    Wrist extension    Wrist ulnar deviation    Wrist radial deviation    Wrist pronation    Wrist supination     (Blank rows = not tested)  UPPER EXTREMITY MMT:  MMT Right eval Left eval  Shoulder flexion    Shoulder extension    Shoulder abduction    Shoulder adduction    Shoulder extension    Shoulder internal rotation    Shoulder external rotation    Middle trapezius    Lower trapezius    Elbow flexion    Elbow extension    Wrist flexion    Wrist extension    Wrist ulnar deviation    Wrist radial deviation    Wrist pronation    Wrist supination    Grip strength     (Blank rows = not tested)  CERVICAL SPECIAL TESTS:  {Cervical special tests:25246}  FUNCTIONAL TESTS:  {Functional tests:24029}  TREATMENT DATE: ***                                                                                                                                 PATIENT EDUCATION:  Education details: *** Person educated: {Person educated:25204} Education method: {Education Method:25205} Education comprehension: {Education Comprehension:25206}  HOME EXERCISE PROGRAM: ***  ASSESSMENT:  CLINICAL IMPRESSION: Patient is a 63 y.o. male who was seen today for physical therapy evaluation and treatment for cervical stenosis and post op since 03-13-24 s/p ACDF C4-6  for cervical myelopathy.  He reports he lives at home alone with an aide that assist 3 days/week with  ADLs. He wheels around using the rollator in the home and uses a cane in the community for ambulation. He reports using PRN 02 at baseline. PT follow up to maximize independence and decrease caregiver burden  OBJECTIVE IMPAIRMENTS: {opptimpairments:25111}.   ACTIVITY LIMITATIONS: {activitylimitations:27494}  PARTICIPATION LIMITATIONS: {participationrestrictions:25113}  PERSONAL FACTORS: {Personal factors:25162} are also affecting patient's functional outcome.   REHAB POTENTIAL: {rehabpotential:25112}  CLINICAL DECISION MAKING: {clinical decision making:25114}  EVALUATION COMPLEXITY: {Evaluation complexity:25115}   GOALS: Goals reviewed with patient? {yes/no:20286}  SHORT TERM GOALS: Target date: ***  *** Baseline:  Goal status: INITIAL  2.  *** Baseline:  Goal status: INITIAL  3.  *** Baseline:  Goal status: INITIAL  4.  *** Baseline:  Goal status: INITIAL  5.  *** Baseline:  Goal status: INITIAL  6.  *** Baseline:  Goal status: INITIAL  LONG TERM GOALS: Target date: ***  *** Baseline:  Goal status: INITIAL  2.  *** Baseline:  Goal status: INITIAL  3.  *** Baseline:  Goal status: INITIAL  4.  *** Baseline:  Goal status: INITIAL  5.  ***  Baseline:  Goal status: INITIAL  6.  *** Baseline:  Goal status: INITIAL   PLAN:  PT FREQUENCY: {rehab frequency:25116}  PT DURATION: {rehab duration:25117}  PLANNED INTERVENTIONS: {rehab planned interventions:25118::97110-Therapeutic exercises,97530- Therapeutic [phone]- Neuromuscular re-education,97535- Self Rjmz,02859- Manual therapy}  PLAN FOR NEXT SESSION: ***   ***  For all possible CPT codes, reference the Planned Interventions line above.     Check all conditions that are expected to impact treatment: {Conditions expected to impact treatment:{Conditions expected to impact treatment:28273}

## 2024-05-16 ENCOUNTER — Ambulatory Visit: Attending: Surgery | Admitting: Physical Therapy

## 2024-05-16 ENCOUNTER — Encounter: Payer: Self-pay | Admitting: Physical Therapy

## 2024-05-16 ENCOUNTER — Other Ambulatory Visit: Payer: Self-pay

## 2024-05-16 DIAGNOSIS — M25551 Pain in right hip: Secondary | ICD-10-CM | POA: Insufficient documentation

## 2024-05-16 DIAGNOSIS — M5442 Lumbago with sciatica, left side: Secondary | ICD-10-CM | POA: Insufficient documentation

## 2024-05-16 DIAGNOSIS — G8929 Other chronic pain: Secondary | ICD-10-CM | POA: Insufficient documentation

## 2024-05-16 DIAGNOSIS — M25562 Pain in left knee: Secondary | ICD-10-CM | POA: Insufficient documentation

## 2024-05-16 DIAGNOSIS — M542 Cervicalgia: Secondary | ICD-10-CM | POA: Diagnosis present

## 2024-05-16 DIAGNOSIS — R2689 Other abnormalities of gait and mobility: Secondary | ICD-10-CM | POA: Diagnosis present

## 2024-05-16 DIAGNOSIS — M5441 Lumbago with sciatica, right side: Secondary | ICD-10-CM | POA: Insufficient documentation

## 2024-05-16 DIAGNOSIS — R2681 Unsteadiness on feet: Secondary | ICD-10-CM | POA: Insufficient documentation

## 2024-05-16 NOTE — Patient Instructions (Signed)
 Aquatic Therapy at [facility]-  What to Expect!  Where:   [facility] [address] [place], [place] [address] Rehab phone [phone]  NOTE:  You will receive an automated phone message reminding you of your appt and it will say the appointment is at the [facility] clinic.          How to Prepare: Please make sure you drink 8 ounces of water about one hour prior to your pool session A caregiver may attend if needed with the patient to help assist as needed. A caregiver can sit in the pool room on chair. Please arrive IN YOUR SUIT and 15 minutes prior to your appointment - this helps to avoid delays in starting your session. Please make sure to attend to any toileting needs prior to entering the pool [place] rooms for changing are provided.   There is direct access to the pool deck form the locker room.  You can lock your belongings in a locker with lock provided. Once on the pool deck your therapist will ask if you have signed the Patient  Consent and Assignment of Benefits form before beginning treatment Your therapist may take your blood pressure prior to, during and after your session if indicated We usually try and create a home exercise program based on activities we do in the pool.  Please be thinking about who might be able to assist you in the pool should you need to participate in an aquatic home exercise program at the time of discharge if you need assistance.  Some patients do not want to or do not have the ability to participate in an aquatic home program - this is not a barrier in any way to you participating in aquatic therapy as part of your current therapy plan! After Discharge from PT, you can continue using home program at  the [facility], there is a drop-in fee for $5 ($45 a month)or for 60 years  or older $4.00 ($40 a month for seniors ) or any local [employer] pool.  Memberships for purchase are  available for gym/pool at [facility]  IT IS VERY IMPORTANT THAT YOUR LAST VISIT BE IN THE CLINIC AT [facility] [address] AFTER YOUR LAST AQUATIC VISIT.  PLEASE MAKE SURE THAT YOU HAVE A [address]  APPOINTMENT SCHEDULED.   About the pool: Pool is located approximately 500 FT from the entrance of the building.  Please bring a support person if you need assistance traveling this      distance.   Your therapist will assist you in entering the water; there are two ways to           enter: stairs with railings, and a mechanical lift. Your therapist will determine the most appropriate way for you.  Water temperature is usually between 88-90 degrees  There may be up to 2 other swimmers in the pool at the same time  The pool deck is tile, please wear shoes with good traction if you prefer not to be barefoot.    Contact Info:  For appointment scheduling and cancellations:         Please call the [facility]  PH:[phone]              Aquatic Therapy  Outpatient Rehabilitation @ [facility]       All sessions are 45 minutes  Graydon Dingwall, PT, ATRIC Certified Exercise Expert for the Aging Adult  05/16/24 1:32 PM Phone: [phone] Fax: [phone]

## 2024-05-27 NOTE — Therapy (Signed)
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 average score for MDIC.   Baseline: 0/3  0%  Goal Status  INITIAL     PLAN:  PT FREQUENCY: 1-2x/week  PT DURATION: 6 weeks  PLANNED INTERVENTIONS: 02835- PT Re-evaluation, 97750- Physical Performance Testing, 97110-Therapeutic exercises, 97530- Therapeutic activity, 97112- Neuromuscular re-education, 97535- Self Care, 02859- Manual therapy, [phone]- Gait training, [phone]- Aquatic Therapy, [phone]- Electrical stimulation (unattended), [phone] (1-2 muscles), [phone] (3+ muscles)- Dry Needling, Patient/Family education, Balance training, Stair training, Taping, Joint mobilization, Spinal mobilization, DME instructions, Cryotherapy, and Moist heat  PLAN FOR NEXT SESSION: Balance, strengthening and progress HEP and fall  preparedness   Graydon Dingwall, PT, [facility] Certified Exercise Expert for the Aging Adult  05/28/24 4:10 PM Phone: [phone] Fax: [phone]   For all possible CPT codes, reference the  Planned Interventions line above.     Check all conditions that are expected to impact treatment: {Conditions expected to impact treatment:Morbid obesity, Respiratory disorders, Diabetes mellitus, Musculoskeletal disorders, and Neurological condition and/or seizures

## 2024-05-28 ENCOUNTER — Ambulatory Visit: Attending: Surgery | Admitting: Physical Therapy

## 2024-05-28 ENCOUNTER — Encounter: Payer: Self-pay | Admitting: Physical Therapy

## 2024-05-28 DIAGNOSIS — G8929 Other chronic pain: Secondary | ICD-10-CM | POA: Insufficient documentation

## 2024-05-28 DIAGNOSIS — M5442 Lumbago with sciatica, left side: Secondary | ICD-10-CM | POA: Insufficient documentation

## 2024-05-28 DIAGNOSIS — R2681 Unsteadiness on feet: Secondary | ICD-10-CM | POA: Diagnosis present

## 2024-05-28 DIAGNOSIS — M542 Cervicalgia: Secondary | ICD-10-CM | POA: Diagnosis present

## 2024-05-28 DIAGNOSIS — M25562 Pain in left knee: Secondary | ICD-10-CM | POA: Diagnosis present

## 2024-05-28 DIAGNOSIS — R2689 Other abnormalities of gait and mobility: Secondary | ICD-10-CM | POA: Insufficient documentation

## 2024-05-28 DIAGNOSIS — M25551 Pain in right hip: Secondary | ICD-10-CM | POA: Diagnosis present

## 2024-05-28 DIAGNOSIS — M5441 Lumbago with sciatica, right side: Secondary | ICD-10-CM | POA: Insufficient documentation

## 2024-05-29 NOTE — Therapy (Signed)
 OUTPATIENT PHYSICAL THERAPY CERVICAL AND THORACOLUMBAR TREATMENT   Patient Name: [name] MRN: [id number] DOB:04-03-1961, 63 y.o., male Today's Date: 05/30/2024  END OF SESSION:  PT End of Session - 05/30/24 1508     Visit Number 3    Number of Visits 13    Date for PT Re-Evaluation 06/27/24    Authorization Type MCD [employer]  ODI PSFS    PT Start Time 1505    PT Stop Time 1535    PT Time Calculation (min) 30 min    Activity Tolerance Patient tolerated treatment well;Patient limited by pain;Patient limited by fatigue    Behavior During Therapy [facility] for tasks assessed/performed            Past Medical History:  Diagnosis Date   Anemia    Arthritis    Back pain    CAD (coronary artery disease)    a. s/p DES to LAD in 05/2016   Cervical radiculopathy    Chronic diastolic CHF (congestive heart failure) (HCC)    Chronic pain    Deep vein thrombosis (HCC) 01/06/2016   Formatting of this note might be different from the original.  Formatting of this note might be different from the original. Last Assessment & Plan: Management per primary care. Last Assessment & Plan: Management per primary care.  Formatting of this note might be different from the original. Last Assessment & Plan: Management per primary care. Last Assessment & Plan: Management per primary care.   Depression    DVT (deep venous thrombosis) (HCC)    Dyspnea    GERD (gastroesophageal reflux disease)    Hematemesis    Hepatic steatosis    Hyperlipidemia    Hypertension    IBS (irritable bowel syndrome)    Morbid obesity (HCC)    Myocardial infarction (HCC)    OSA (obstructive sleep apnea)    oxygen  4L/Fulda at night and PRN   Pancreatitis    PE (pulmonary thromboembolism) (HCC)    Peripheral neuropathy    PUD (peptic ulcer disease)    Pulmonary embolism (HCC) 07/15/2021   Renal disorder    Stroke [facility])    a. ?details unclear - not seen on imaging when he was admitted in 05/2017 for TIA symptoms  which were felt due to cervical radiculopathy.   Thoracic aortic ectasia (HCC)    a. 4.3cm ectatic ascending thoracic aorta by CT 06/2017.    Type 2 diabetes mellitus (HCC)    Past Surgical History:  Procedure Laterality Date   ANTERIOR CERVICAL CORPECTOMY N/A 03/13/2024   Procedure: ANTERIOR CERVICAL DECOMPRESSION AND FUSION WITH PLATING CERVICAL FOUR-CERVICAL FIVE/CERVICAL FIVE-CERVICAL SIX;  Surgeon: Debby Dorn MATSU, MD;  Location: [facility] OR;  Service: Neurosurgery;  Laterality: N/A;   CARDIAC CATHETERIZATION N/A 05/31/2016   Procedure: Left Heart Cath and Coronary Angiography;  Surgeon: Peter M Swaziland, MD;  Location: [facility] INVASIVE CV LAB;  Service: Cardiovascular;  Laterality: N/A;   CARDIAC CATHETERIZATION N/A 05/31/2016   Procedure: Intravascular Pressure Wire/FFR Study;  Surgeon: Peter M Swaziland, MD;  Location: [facility] INVASIVE CV LAB;  Service: Cardiovascular;  Laterality: N/A;   CARDIAC CATHETERIZATION N/A 05/31/2016   Procedure: Coronary Stent Intervention;  Surgeon: Peter M Swaziland, MD;  Location: [facility] INVASIVE CV LAB;  Service: Cardiovascular;  Laterality: N/A;   COLONOSCOPY N/A 07/17/2021   Procedure: COLONOSCOPY;  Surgeon: Dianna Specking, MD;  Location: [facility] ENDOSCOPY;  Service: Endoscopy;  Laterality: N/A;   COLONOSCOPY WITH PROPOFOL  N/A 04/29/2020   Procedure: COLONOSCOPY WITH PROPOFOL ;  Surgeon: Dianna Specking, MD;  Location: [facility] ENDOSCOPY;  Service: Endoscopy;  Laterality: N/A;   ESOPHAGOGASTRODUODENOSCOPY N/A 04/29/2020   Procedure: ESOPHAGOGASTRODUODENOSCOPY (EGD);  Surgeon: Dianna Specking, MD;  Location: [facility] ENDOSCOPY;  Service: Endoscopy;  Laterality: N/A;   ESOPHAGOGASTRODUODENOSCOPY (EGD) WITH PROPOFOL  N/A 07/17/2021   Procedure: ESOPHAGOGASTRODUODENOSCOPY (EGD) WITH PROPOFOL ;  Surgeon: Dianna Specking, MD;  Location: [facility] ENDOSCOPY;  Service: Endoscopy;  Laterality: N/A;   GIVENS CAPSULE STUDY N/A 11/10/2022   Procedure: GIVENS CAPSULE STUDY;  Surgeon: Elicia Claw, MD;  Location: [facility]  ENDOSCOPY;  Service: Gastroenterology;  Laterality: N/A;   LEFT HEART CATH AND CORONARY ANGIOGRAPHY N/A 12/08/2017   Procedure: LEFT HEART CATH AND CORONARY ANGIOGRAPHY;  Surgeon: Anner Alm ORN, MD;  Location: [facility] INVASIVE CV LAB;  Service: Cardiovascular;  Laterality: N/A;   LEFT HEART CATHETERIZATION WITH CORONARY ANGIOGRAM N/A 02/03/2014   Procedure: LEFT HEART CATHETERIZATION WITH CORONARY ANGIOGRAM;  Surgeon: Vinie KYM Maxcy, MD;  Location: [facility] CATH LAB;  Service: Cardiovascular;  Laterality: N/A;   left leg stent      POLYPECTOMY  04/29/2020   Procedure: POLYPECTOMY;  Surgeon: Dianna Specking, MD;  Location: [facility] ENDOSCOPY;  Service: Endoscopy;;   POLYPECTOMY  07/17/2021   Procedure: POLYPECTOMY;  Surgeon: Dianna Specking, MD;  Location: [facility] ENDOSCOPY;  Service: Endoscopy;;   Patient Active Problem List   Diagnosis Date Noted   Cervical myelopathy (HCC) 03/13/2024   Bronchitis 07/06/2023   Chronic respiratory failure with hypoxia (HCC) 07/06/2023   Pulmonary embolism (HCC) 07/06/2023   AKI (acute kidney injury) (HCC) 05/14/2023   Paresthesias 11/22/2022   Hypotension 11/21/2022   Acute kidney injury superimposed on chronic kidney disease (HCC) 11/21/2022   GI bleeding 11/10/2022   History of pulmonary embolism 11/10/2022   Right-sided Bell's palsy 10/11/2022   Class 3 obesity 08/16/2022   Diplopia 08/15/2022   Carpal tunnel syndrome on both sides 03/14/2022   Diabetic ulcer of lower leg (HCC) 06/26/2021   Lumbar radiculitis 03/30/2021   Morbid obesity (HCC) 01/15/2021   History of colonic polyps 01/15/2021   Pain due to onychomycosis of toenails of both feet 01/15/2021   Epistaxis, recurrent 10/21/2020   Laceration of nose 10/21/2020   OSA (obstructive sleep apnea)    Bilateral carotid artery stenosis 07/28/2020   Bilateral lower extremity edema 07/28/2020   CHF (congestive heart failure) (HCC) 07/28/2020   DDD (degenerative disc disease), lumbar 07/28/2020   Diabetic  peripheral neuropathy (HCC) 07/28/2020   Fatty liver 07/28/2020   Irritable bowel syndrome with diarrhea 07/28/2020   Osteoarthritis of right hip 07/28/2020   Mixed diabetic hyperlipidemia associated with type 2 diabetes mellitus (HCC) 04/27/2020   Rotator cuff arthropathy 10/30/2019   Cervical radiculopathy 09/24/2019   Trochanteric bursitis of right hip 09/24/2019   Chronic diastolic CHF (congestive heart failure) (HCC) 08/23/2019   ARF (acute renal failure) (HCC) 08/01/2019   Hyperkalemia 08/01/2019   Uncontrolled type 2 diabetes mellitus with hyperglycemia (HCC) 08/01/2019   Trochanteric bursitis 02/11/2019   Long-term insulin  use (HCC) 01/25/2019   Neck pain 10/09/2018   Left arm weakness 10/09/2018   Decreased sensation of lower extremity 09/12/2018   B12 deficiency 08/10/2017   Persistent headaches 08/09/2017   GERD (gastroesophageal reflux disease) 06/29/2017   Left arm numbness    History of CVA (cerebrovascular accident) 06/17/2017   TIA (transient ischemic attack) 06/17/2017   Degenerative joint disease involving multiple joints 04/09/2017   Foraminal stenosis of lumbar region 01/19/2017   Chronic back pain 12/29/2016   Depression 12/15/2016  Vitamin D  deficiency 12/09/2016   Right hip pain 12/07/2016   Hypokalemia 12/07/2016   Type 2 diabetes mellitus with vascular disease (HCC) 05/31/2016   Normocytic normochromic anemia 05/31/2016   Chest pain 05/31/2016   Coronary artery disease involving native coronary artery of native heart without angina pectoris 01/06/2016   Chronic pancreatitis (HCC) 03/21/2014   Edema 03/21/2014   Chest pain, non-cardiac 01/29/2014   DM2 (diabetes mellitus, type 2) (HCC) 01/29/2014   Obesity, Class III, BMI 40-49.9 (morbid obesity) 01/29/2014   Snoring 01/29/2014   Dyslipidemia 01/29/2014   HTN (hypertension) 01/29/2014   Abnormal nuclear stress test 01/29/2014   Benign essential hypertension 08/15/2013    [facility]   REFERRING PROVIDER: Johnanna Camie Mates, PA-C   REFERRING DIAG: [id number] (ICD-10-CM) - Spinal stenosis, cervical region   THERAPY DIAG:  Chronic low back pain with bilateral sciatica, unspecified back pain laterality  Cervicalgia  Pain in right hip  Chronic pain of left knee  Unsteadiness on feet  Other abnormalities of gait and mobility  Rationale for Evaluation and Treatment: Rehabilitation  ONSET DATE:  03-13-24 s/p ACDF C4-6   SUBJECTIVE:                                                                                                                                                                                                         SUBJECTIVE STATEMENT: I am a 7/10  but this morning I was a 10/10 and I went in to pain management becsues My Right hip gave out and I was in so much pain.   Pain management says I need to get a Hip replacement but I need to lose weight. My arms are so tight too. I feel so tight all over. My family practice MD wants me to have echo cardiogram on August 27th. I just don't have a lot of energy and I can barely eat.   EVAL- I had neck surgery  03-13-24 s/p ACDF C4-6.  I was having black outs and the whole right side of my arms would be numbness and had tingling.  The surgery went well and my arms have no more tingling and numbness but both my legs do have issues. I have neuropathy. I have two bad discs in my low back and I have pain in my Right hip and Left knee.  I have to lose weight. Pain in neck , back Right hip and left knee.  Sometimes the pain takes my breathe away. I do not do exercise routinely. I only walk  what I have to but not for exercise Hand dominance: Right  PERTINENT HISTORY:  morbid obesity, PE, DVT,  chronic pain, anemia, arthritis, CHF, DM, CAD, HTN, dyspnea, peripheral neuropathy, LE,  thoracic aortic ecstasia see med hx  PAIN:  Are you having pain? Yes: NPRS scale: back at rest 6/10 never a 0/10, at worst  10/10, neck pain at rest 2/10 at worst 10/10 Right hip at rest 3/10 at worst 10/10 and left knee at rest 6/10 and at worst 10/10 Pain location: back pain worst, then neck right hip and knee Pain description: Aching sometime sharp Aggravating factors: walking, be able to sleep in bed, any movement, turning neck, sleeping at night, turning in bed,  Relieving factors: some meds  PRECAUTIONS: Fall  RED FLAGS: Pt denies     WEIGHT BEARING RESTRICTIONS: No  FALLS:  Has patient fallen in last 6 months? No  LIVING ENVIRONMENT: Lives with: lives with their family and lives alone Lives in: House/apartment Stairs: Yes: External: 3 floors steps; on right going up pt uses elevator Has following equipment at home: Single point cane, Environmental consultant - 4 wheeled, Tour manager, and Grab bars  OCCUPATION: Disabled since 2016 due to MI  PLOF: Pt has a caregiver that helps wash back and house duties, laundry, cleaning and some cooking and  sometimes needed for putting shirts on and compression socks  PATIENT GOALS: Get stronger to get back into the gym and at [employer]  NEXT MD VISIT: Primary MD  05-28-24  OBJECTIVE:  Note: Objective measures were completed at Evaluation unless otherwise noted.  DIAGNOSTIC FINDINGS:  EXAM: CERVICAL SPINE - 2-3 VIEW   COMPARISON:  09/02/2016, MRI 10/27/2023   FINDINGS: Eight low resolution intraoperative spot views of the cervical spine. Total fluoroscopy time was 42 seconds, fluoroscopic dose of 43.13 mGy.   The images demonstrate anterior localizing instrument anterior to C5. Final images demonstrate anterior plate, fixating screws and interbody devices at the lower cervical spine. Curvilinear density on final image overlies the right lower cervical region   IMPRESSION: Intraoperative fluoroscopic assistance provided during cervical spine surgery.     Electronically Signed   By: Luke Bun M.D.   On: 03/13/2024 21:07    PATIENT SURVEYS:   Modified Oswestry:  MODIFIED OSWESTRY DISABILITY SCALE  Date: 05-16-24 Score  Pain intensity 3 =  Pain medication provides me with moderate relief from pain.  2. Personal care (washing, dressing, etc.) 3 =  I need help, but I am able to manage most of my personal care.  3. Lifting 4 = I can lift only very light weights  4. Walking 4 = I can only walk with crutches or a cane.  5. Sitting 2 =  Pain prevents me from sitting more than 1 hour.  6. Standing 4 =  Pain prevents me from standing more than 10 minutes.  7. Sleeping 2 =  Even when I take pain medication, I sleep less than 6 hours  8. Social Life 5 =  I have hardly any social life because of my pain.  9. Traveling 2 =  My pain restricts my travel over 2 hours.  10. Employment/ Homemaking 2 = I can perform most of my homemaking/job duties, but pain prevents me from performing more physically stressful activities (eg, lifting, vacuuming).  Total 31/50   62%   Interpretation of scores: Score Category Description  0-20% Minimal Disability The patient can cope with most living activities. Usually no treatment is indicated apart from  advice on lifting, sitting and exercise  21-40% Moderate Disability The patient experiences more pain and difficulty with sitting, lifting and standing. Travel and social life are more difficult and they may be disabled from work. Personal care, sexual activity and sleeping are not grossly affected, and the patient can usually be managed by conservative means  41-60% Severe Disability Pain remains the main problem in this group, but activities of daily living are affected. These patients require a detailed investigation  61-80% Crippled Back pain impinges on all aspects of the patient's life. Positive intervention is required  81-100% Bed-bound  These patients are either bed-bound or exaggerating their symptoms  Bluford FORBES Zoe DELENA Karon DELENA, et al. Surgery versus conservative management of stable thoracolumbar  fracture: the PRESTO feasibility RCT. [place] ([place]): [employer]; 2021 Nov. [facility] Technology Assessment, No. 25.62.) Appendix 3, Oswestry Disability Index category descriptors. Available from: FindJewelers.cz  Minimally Clinically Important Difference (MCID) = 12.8% PSFS: THE PATIENT SPECIFIC FUNCTIONAL SCALE  Place score of 0-10 (0 = unable to perform activity and 10 = able to perform activity at the same level as before injury or problem)  Activity Date: 05-16-24    Return to gym for Exercise ([employer])       0    2.Walking for continuous 15 minutes without stopping        0    3.Be able to sleep in bed (recliner full time now)       0    4.      Total Score 0/3   0%      Total Score = Sum of activity scores/number of activities  Minimally Detectable Change: 3 points (for single activity); 2 points (for average score)  [facility] (nd). The Patient Specific Functional Scale . Retrieved from [email or web address].com.pt   COGNITION: Overall cognitive status: Within functional limits for tasks assessed  SENSATION: LE peripheral neuropathy sometimes to Great toes and bottom of feet. UE  WNL  POSTURE: rounded shoulders, forward head, decreased lumbar lordosis, flexed trunk , and morbid obesity  PALPATION: Cervical  TTP over cervical paraspinals Right  vs Left   CERVICAL ROM:   Active ROM A/PROM (deg) eval  Flexion 25  Extension 30*  Right lateral flexion 16 *  Left lateral flexion 19  Right rotation 40 *  Left rotation 45*   Key: WFL = within functional limits not formally assessed, * = concordant pain, s = stiffness/stretching sensation, NT = not tested)   (Blank rows = not tested, score listed is out of 5 possible points.  N = WNL, D = diminished, C = clear for gross weakness with myotome testing, * = concordant pain with testing)   UPPER EXTREMITY ROM:  Active  ROM Right eval Left eval  Shoulder flexion 115/ P 120 135 /P144  Shoulder extension    Shoulder abduction 110 129  Shoulder adduction    Shoulder extension    Shoulder internal rotation    Shoulder external rotation    Elbow flexion    Elbow extension    Wrist flexion    Wrist extension    Wrist ulnar deviation    Wrist radial deviation    Wrist pronation    Wrist supination     Key: WFL = within functional limits not formally assessed, * = concordant pain, s = stiffness/stretching sensation, NT = not tested)   (Blank rows = not tested, score listed is out of 5 possible points.  N =  WNL, D = diminished, C = clear for gross weakness with myotome testing, * = concordant pain with testing)   UPPER EXTREMITY MMT:  MMT Right eval Left eval  Shoulder flexion 4- 4-  Shoulder extension    Shoulder abduction 4- 4-  Shoulder adduction    Shoulder extension    Shoulder internal rotation    Shoulder external rotation    Middle trapezius    Lower trapezius    Elbow flexion    Elbow extension    Wrist flexion    Wrist extension    Wrist ulnar deviation    Wrist radial deviation    Wrist pronation    Wrist supination    Grip strength    (Blank rows = not tested, score listed is out of 5 possible points.  N = WNL, D = diminished, C = clear for gross weakness with myotome testing, * = concordant pain with testing)   CERVICAL SPECIAL TESTS:  NT due to post surgery   LUMBAR ROM:   AROM eval  Flexion Fingertips to mid shin *  Extension 0 *  Right lateral flexion 10  *  Left lateral flexion 10*  Right rotation 50%*  Left rotation 50% *   Key: WFL = within functional limits not formally assessed, * = concordant pain, s = stiffness/stretching sensation, NT = not tested  LOWER EXTREMITY ROM:     Active  Right eval Left eval  Hip flexion 70standing 70 standing  Hip extension    Hip abduction    Hip adduction    Hip internal rotation    Hip external rotation    Knee  flexion 102 98  Knee extension 11 degree ext deficit 20 degree ext deficit  Ankle dorsiflexion    Ankle plantarflexion    Ankle inversion    Ankle eversion      LOWER EXTREMITY MMT:    MMT Right eval Left eval  Hip flexion 3- 4-  Hip extension    Hip abduction 3- 4-  Hip adduction    Hip internal rotation    Hip external rotation    Knee flexion 4- 4-  Knee extension 3- 4  Ankle dorsiflexion    Ankle plantarflexion 0/25 0/25  Ankle inversion    Ankle eversion    (Blank rows = not tested, score listed is out of 5 possible points.  N = WNL, D = diminished, C = clear for gross weakness with myotome testing, * = concordant pain with testing)       FUNCTIONAL TESTS:  5 times sit to stand: 15.48 sec  2 minute walk test: 230 ft  Norm 562ft SpO2% 93%  TREATMENT DATE:   [facility] Adult PT Treatment:                                                DATE: 05-30-24 Therapeutic Exercise: With Pball seated forward flexion 2 x 5 with 5  hold With pball seated r and Left flexion 2 x 5 with 5 hold  Pain on the right worse than left. Seated hamstring curls with GTB 1 x 15 on R and L Knee extension resisted on Right and left with GTB 2 x 10 each Supine march 1 x 10  right and Left  Manual Therapy: LAD of Right LE with decreased pain using towel at ankle  Therapeutic Activity: STS without using UE 2 x 10   [facility] Adult PT Treatment:                                                DATE: 05-28-24 Therapeutic Exercise: Seated hamstring curls with GTB 1 x 10 on R and L Knee extension resisted on Right and left with GTB 2 x 10 each Seated lumbar rotation with UE elevated 90 degree flexion Lumbar flexion to R and L and forward for lumbar stretch Seated Cervical AROM flex/ext, side bending R and L and rotation to R and L 10 x each  Therapeutic Activity: In ll bars for safety, sit to stand  2 x 5  2 min rest and then 1 x 6 Heel raises in ll bars with UE support STanding march in ll  bars Standing backward march in llbars Standing hip extension with RTB 2 x 10 bil Standing hip abduction with RTB 2 x 10 bil  Self Care: Education on hydration, nutrition, sleep and movement requirements for health.  Importance of progressive strength and standing tolerance    05-16-24  EVAL  issue HEP                                                                                                                                 PATIENT EDUCATION:  Education details: POC explanation of findings, issue HEP Person educated: Patient Education method: Explanation, Demonstration, Tactile cues, Verbal cues, and Handouts Education comprehension: verbalized understanding, returned demonstration, verbal cues required, tactile cues required, and needs further education  HOME EXERCISE PROGRAM: Access Code: [email or web address] URL: [email or web address] Date: 05/16/2024 Prepared by: Graydon Dingwall  Exercises - Sit to stand with sink support Movement snack  - 1 x daily - 7 x weekly - 3 sets - 10 reps - Seated Cervical Rotation AROM  - 1 x daily - 7 x weekly - 1 sets - 10 reps - Seated Cervical Extension AROM  - 1 x daily - 7 x weekly - 1 sets - 10 reps - Standing Cervical Sidebending AROM  - 1 x daily - 7 x weekly - 1 sets - 10 reps - Seated Trunk Rotation - Arms Crossed  - 1 x daily - 7 x weekly - 2 sets - 10 reps - Seated Lumbar flexion forward and to side  - 2 x daily - 7 x weekly - 1 sets - 10 reps Added 05-28-24  - Standing Hip Extension with Resistance at Ankles and Counter Support  - 1 x daily - 7 x weekly - 3 sets - 10 reps - Standing Hip Abduction with Resistance at Ankles and Counter Support  - 1 x daily - 7 x weekly - 3 sets - 10 reps - Heel Raises with Counter Support  - 1 x daily -  7 x weekly - 3 sets - 10 reps ASSESSMENT:  CLINICAL IMPRESSION: Anival enters clinic with 7/10 pain. Pt states he had to go to pain management and has an echocardiogram scheduled for later in  month.  He felt pain in his hip and it gave out today and he declined doing standing exercises this afternoon. Pt demonstrates fatigue early in session and requests to end session in order to go home and rest.  Pt did not want to miss PT.   Pt did have some relief of pain in Left hip with LAD. Pt is morbidly obese and would benefit from continuing some session in aquatics in order to increase more pain free motion and movement.  Pt needs to be able to find exercises in which he can be consistent in order to achieve his goals.  Will continue to progress as pt is able    EVAL- Patient is a 63 y.o. male who was seen today for physical therapy evaluation and treatment for cervical stenosis and post op since 03-13-24 s/p ACDF C4-6  for cervical myelopathy. Pt shows weakness globally and difficulty walking mostly limited by endurance and dyspnea. Pt reports he used to work out at [employer] and he would like to become strong enough to return to exercise at the gym. He reports he lives at home alone with an aide that assist 3 days/week with ADLs. He wheels around using the rollator in the home and uses a cane in the community for ambulation. He reports using occassional O2.  Mr. Delpizzo will benefit form skilled PT to maximize functional mobility and maximize strength for more active lifestyle and well as fall preparedness since he lives alone in home.  OBJECTIVE IMPAIRMENTS: cardiopulmonary status limiting activity, decreased activity tolerance, decreased balance, decreased knowledge of condition, decreased knowledge of use of DME, decreased mobility, difficulty walking, decreased ROM, decreased strength, impaired sensation, impaired UE functional use, improper body mechanics, postural dysfunction, and obesity. pain  ACTIVITY LIMITATIONS: carrying, lifting, bending, sitting, standing, squatting, sleeping, stairs, transfers, bed mobility, reach over head, and locomotion level  PARTICIPATION LIMITATIONS: meal  prep, cleaning, interpersonal relationship, community activity, and walking for exercise  PERSONAL FACTORS: morbid obesity, PE, DVT,  chronic pain, anemia, arthritis, CHF, DM, CAD, HTN, dyspnea, peripheral neuropathy, LE,  thoracic aortic ecstasia see med hx are also affecting patient's functional outcome.   REHAB POTENTIAL: Good  CLINICAL DECISION MAKING: Evolving/moderate complexity  EVALUATION COMPLEXITY: Moderate   GOALS: Goals reviewed with patient? Yes  SHORT TERM GOALS: Target date: 06-06-24  Pt will demonstrate appropriate understanding and performance of initially prescribed HEP in order to facilitate improved independence with management of symptoms.  Baseline: no knowledge Goal status: INITIAL  2.   Pt will perform 5xSTS in <13.0 sec in order to demonstrate reduced fall risk and improved functional independence. (MCID of 2.3sec) Baseline: eval 15.48 sec Goal status: INITIAL  3.  Report pain decrease from 10/10 to 5/10 with functional activities Baseline: 10/10 on eval Goal status: INITIAL    LONG TERM GOALS: Target date: 06-27-24  Pt will be independent with advanced HEP and demonstrate use of gym equipment Baseline: Pt unable to exercise or return to [employer] Goal status: INITIAL  2.  Pt will demonstrated increased knee and hip strength  with by MCID of 29ft as indication of improved functional mobility strength grade of the Bil LE for improved hip/knee stability and function Baseline: eval 230 ft  ( norm 588 ft) Goal status: INITIAL  3.  Pt will be able to walk continuously for 15 minutes without rest break in order to improve ability to walk in store Baseline: Pt with dypnea at 93% after 2 MWT Goal status: INITIAL  4.  Pt will be able to perform standing to supine transfers and tolerate exrcises in supine in order to return to bed for sleep Baseline: Pt sleeps in recliner full time Goal status: INITIAL  5.  Pt. will show a >/= 12 pt improvement in  their ODI score (MCID is 12 pts) as a proxy for functional improvement Baseline: 31/50 62% Goal status: INITIAL  6. PSFS score with increase to at least 2 points for average score for MDIC.   Baseline: 0/3  0%  Goal Status  INITIAL     PLAN:  PT FREQUENCY: 1-2x/week  PT DURATION: 6 weeks  PLANNED INTERVENTIONS: 02835- PT Re-evaluation, 97750- Physical Performance Testing, 97110-Therapeutic exercises, 97530- Therapeutic activity, 97112- Neuromuscular re-education, 97535- Self Care, 02859- Manual therapy, [phone]- Gait training, [phone]- Aquatic Therapy, [phone]- Electrical stimulation (unattended), [phone] (1-2 muscles), [phone] (3+ muscles)- Dry Needling, Patient/Family education, Balance training, Stair training, Taping, Joint mobilization, Spinal mobilization, DME instructions, Cryotherapy, and Moist heat  PLAN FOR NEXT SESSION: Balance, strengthening and progress HEP and fall preparedness   Graydon Dingwall, PT, [facility] Certified Exercise Expert for the Aging Adult  05/30/24 3:55 PM Phone: [phone] Fax: [phone]    For all possible CPT codes, reference the Planned Interventions line above.     Check all conditions that are expected to impact treatment: {Conditions expected to impact treatment:Morbid obesity, Respiratory disorders, Diabetes mellitus, Musculoskeletal disorders, and Neurological condition and/or seizures

## 2024-05-30 ENCOUNTER — Ambulatory Visit: Admitting: Physical Therapy

## 2024-05-30 ENCOUNTER — Encounter: Payer: Self-pay | Admitting: Physical Therapy

## 2024-05-30 ENCOUNTER — Other Ambulatory Visit (HOSPITAL_COMMUNITY): Payer: Self-pay | Admitting: Student

## 2024-05-30 DIAGNOSIS — R2681 Unsteadiness on feet: Secondary | ICD-10-CM

## 2024-05-30 DIAGNOSIS — R2689 Other abnormalities of gait and mobility: Secondary | ICD-10-CM

## 2024-05-30 DIAGNOSIS — M5441 Lumbago with sciatica, right side: Secondary | ICD-10-CM | POA: Diagnosis not present

## 2024-05-30 DIAGNOSIS — G8929 Other chronic pain: Secondary | ICD-10-CM

## 2024-05-30 DIAGNOSIS — M25551 Pain in right hip: Secondary | ICD-10-CM

## 2024-05-30 DIAGNOSIS — M542 Cervicalgia: Secondary | ICD-10-CM

## 2024-05-30 DIAGNOSIS — I5032 Chronic diastolic (congestive) heart failure: Secondary | ICD-10-CM

## 2024-06-04 ENCOUNTER — Encounter: Payer: Self-pay | Admitting: Physical Therapy

## 2024-06-04 ENCOUNTER — Ambulatory Visit: Admitting: Physical Therapy

## 2024-06-04 DIAGNOSIS — M5441 Lumbago with sciatica, right side: Secondary | ICD-10-CM | POA: Diagnosis not present

## 2024-06-04 DIAGNOSIS — M542 Cervicalgia: Secondary | ICD-10-CM

## 2024-06-04 DIAGNOSIS — G8929 Other chronic pain: Secondary | ICD-10-CM

## 2024-06-04 NOTE — Therapy (Addendum)
 OUTPATIENT PHYSICAL THERAPY CERVICAL AND THORACOLUMBAR TREATMENT/DISCHARGE NOTE PHYSICAL THERAPY DISCHARGE SUMMARY  Visits from Start of Care: 4  Current functional level related to goals / functional outcomes: unknown   Remaining deficits: unknown   Education / Equipment: HEP initial   Patient agrees to discharge. Patient goals were not met. Patient is being discharged due to not returning since the last visit. Graydon Dingwall, PT, ATRIC Certified Exercise Expert for the Aging Adult  10/09/2024 1:37 PM Phone: [phone] Fax: [phone]     Patient Name: [name] MRN: [id number] DOB:Jul 18, 1961, 63 y.o., male Today's Date: 06/04/2024  END OF SESSION:  PT End of Session - 06/04/24 1402     Visit Number 4    Number of Visits 13    Date for PT Re-Evaluation 06/27/24    Authorization Type [employer]  ODI PSFS    Authorization Time Period 05/16/24-08/14/24    Authorization - Visit Number 4    Authorization - Number of Visits 16    PT Start Time 0200    PT Stop Time 0245    PT Time Calculation (min) 45 min            Past Medical History:  Diagnosis Date   Anemia    Arthritis    Back pain    CAD (coronary artery disease)    a. s/p DES to LAD in 05/2016   Cervical radiculopathy    Chronic diastolic CHF (congestive heart failure) (HCC)    Chronic pain    Deep vein thrombosis (HCC) 01/06/2016   Formatting of this note might be different from the original.  Formatting of this note might be different from the original. Last Assessment & Plan: Management per primary care. Last Assessment & Plan: Management per primary care.  Formatting of this note might be different from the original. Last Assessment & Plan: Management per primary care. Last Assessment & Plan: Management per primary care.   Depression    DVT (deep venous thrombosis) (HCC)    Dyspnea    GERD (gastroesophageal reflux disease)    Hematemesis    Hepatic steatosis    Hyperlipidemia     Hypertension    IBS (irritable bowel syndrome)    Morbid obesity (HCC)    Myocardial infarction (HCC)    OSA (obstructive sleep apnea)    oxygen  4L/[place] at night and PRN   Pancreatitis    PE (pulmonary thromboembolism) (HCC)    Peripheral neuropathy    PUD (peptic ulcer disease)    Pulmonary embolism (HCC) 07/15/2021   Renal disorder    Stroke [facility])    a. ?details unclear - not seen on imaging when he was admitted in 05/2017 for TIA symptoms which were felt due to cervical radiculopathy.   Thoracic aortic ectasia (HCC)    a. 4.3cm ectatic ascending thoracic aorta by CT 06/2017.    Type 2 diabetes mellitus (HCC)    Past Surgical History:  Procedure Laterality Date   ANTERIOR CERVICAL CORPECTOMY N/A 03/13/2024   Procedure: ANTERIOR CERVICAL DECOMPRESSION AND FUSION WITH PLATING CERVICAL FOUR-CERVICAL FIVE/CERVICAL FIVE-CERVICAL SIX;  Surgeon: Debby Dorn MATSU, MD;  Location: [facility] OR;  Service: Neurosurgery;  Laterality: N/A;   CARDIAC CATHETERIZATION N/A 05/31/2016   Procedure: Left Heart Cath and Coronary Angiography;  Surgeon: Peter M Jordan, MD;  Location: [facility] INVASIVE CV LAB;  Service: Cardiovascular;  Laterality: N/A;   CARDIAC CATHETERIZATION N/A 05/31/2016   Procedure: Intravascular Pressure Wire/FFR Study;  Surgeon: Peter M Jordan, MD;  Location: [facility] INVASIVE CV LAB;  Service: Cardiovascular;  Laterality: N/A;   CARDIAC CATHETERIZATION N/A 05/31/2016   Procedure: Coronary Stent Intervention;  Surgeon: Peter M Jordan, MD;  Location: [facility] INVASIVE CV LAB;  Service: Cardiovascular;  Laterality: N/A;   COLONOSCOPY N/A 07/17/2021   Procedure: COLONOSCOPY;  Surgeon: Dianna Specking, MD;  Location: [facility] ENDOSCOPY;  Service: Endoscopy;  Laterality: N/A;   COLONOSCOPY WITH PROPOFOL  N/A 04/29/2020   Procedure: COLONOSCOPY WITH PROPOFOL ;  Surgeon: Dianna Specking, MD;  Location: [facility] ENDOSCOPY;  Service: Endoscopy;  Laterality: N/A;   ESOPHAGOGASTRODUODENOSCOPY N/A 04/29/2020   Procedure:  ESOPHAGOGASTRODUODENOSCOPY (EGD);  Surgeon: Dianna Specking, MD;  Location: [facility] ENDOSCOPY;  Service: Endoscopy;  Laterality: N/A;   ESOPHAGOGASTRODUODENOSCOPY (EGD) WITH PROPOFOL  N/A 07/17/2021   Procedure: ESOPHAGOGASTRODUODENOSCOPY (EGD) WITH PROPOFOL ;  Surgeon: Dianna Specking, MD;  Location: [facility] ENDOSCOPY;  Service: Endoscopy;  Laterality: N/A;   GIVENS CAPSULE STUDY N/A 11/10/2022   Procedure: GIVENS CAPSULE STUDY;  Surgeon: Elicia Claw, MD;  Location: [facility] ENDOSCOPY;  Service: Gastroenterology;  Laterality: N/A;   LEFT HEART CATH AND CORONARY ANGIOGRAPHY N/A 12/08/2017   Procedure: LEFT HEART CATH AND CORONARY ANGIOGRAPHY;  Surgeon: Anner Alm ORN, MD;  Location: [facility] INVASIVE CV LAB;  Service: Cardiovascular;  Laterality: N/A;   LEFT HEART CATHETERIZATION WITH CORONARY ANGIOGRAM N/A 02/03/2014   Procedure: LEFT HEART CATHETERIZATION WITH CORONARY ANGIOGRAM;  Surgeon: Vinie KYM Maxcy, MD;  Location: [facility] CATH LAB;  Service: Cardiovascular;  Laterality: N/A;   left leg stent      POLYPECTOMY  04/29/2020   Procedure: POLYPECTOMY;  Surgeon: Dianna Specking, MD;  Location: [facility] ENDOSCOPY;  Service: Endoscopy;;   POLYPECTOMY  07/17/2021   Procedure: POLYPECTOMY;  Surgeon: Dianna Specking, MD;  Location: [facility] ENDOSCOPY;  Service: Endoscopy;;   Patient Active Problem List   Diagnosis Date Noted   Cervical myelopathy (HCC) 03/13/2024   Bronchitis 07/06/2023   Chronic respiratory failure with hypoxia (HCC) 07/06/2023   Pulmonary embolism (HCC) 07/06/2023   AKI (acute kidney injury) (HCC) 05/14/2023   Paresthesias 11/22/2022   Hypotension 11/21/2022   Acute kidney injury superimposed on chronic kidney disease (HCC) 11/21/2022   GI bleeding 11/10/2022   History of pulmonary embolism 11/10/2022   Right-sided Bell's palsy 10/11/2022   Class 3 obesity 08/16/2022   Diplopia 08/15/2022   Carpal tunnel syndrome on both sides 03/14/2022   Diabetic ulcer of lower leg (HCC) 06/26/2021   Lumbar  radiculitis 03/30/2021   Morbid obesity (HCC) 01/15/2021   History of colonic polyps 01/15/2021   Pain due to onychomycosis of toenails of both feet 01/15/2021   Epistaxis, recurrent 10/21/2020   Laceration of nose 10/21/2020   OSA (obstructive sleep apnea)    Bilateral carotid artery stenosis 07/28/2020   Bilateral lower extremity edema 07/28/2020   CHF (congestive heart failure) (HCC) 07/28/2020   DDD (degenerative disc disease), lumbar 07/28/2020   Diabetic peripheral neuropathy (HCC) 07/28/2020   Fatty liver 07/28/2020   Irritable bowel syndrome with diarrhea 07/28/2020   Osteoarthritis of right hip 07/28/2020   Mixed diabetic hyperlipidemia associated with type 2 diabetes mellitus (HCC) 04/27/2020   Rotator cuff arthropathy 10/30/2019   Cervical radiculopathy 09/24/2019   Trochanteric bursitis of right hip 09/24/2019   Chronic diastolic CHF (congestive heart failure) (HCC) 08/23/2019   ARF (acute renal failure) (HCC) 08/01/2019   Hyperkalemia 08/01/2019   Uncontrolled type 2 diabetes mellitus with hyperglycemia (HCC) 08/01/2019   Trochanteric bursitis 02/11/2019   Long-term insulin  use (HCC) 01/25/2019   Neck pain 10/09/2018  Left arm weakness 10/09/2018   Decreased sensation of lower extremity 09/12/2018   B12 deficiency 08/10/2017   Persistent headaches 08/09/2017   GERD (gastroesophageal reflux disease) 06/29/2017   Left arm numbness    History of CVA (cerebrovascular accident) 06/17/2017   TIA (transient ischemic attack) 06/17/2017   Degenerative joint disease involving multiple joints 04/09/2017   Foraminal stenosis of lumbar region 01/19/2017   Chronic back pain 12/29/2016   Depression 12/15/2016   Vitamin D  deficiency 12/09/2016   Right hip pain 12/07/2016   Hypokalemia 12/07/2016   Type 2 diabetes mellitus with vascular disease (HCC) 05/31/2016   Normocytic normochromic anemia 05/31/2016   Chest pain 05/31/2016   Coronary artery disease involving native  coronary artery of native heart without angina pectoris 01/06/2016   Chronic pancreatitis (HCC) 03/21/2014   Edema 03/21/2014   Chest pain, non-cardiac 01/29/2014   DM2 (diabetes mellitus, type 2) (HCC) 01/29/2014   Obesity, Class III, BMI 40-49.9 (morbid obesity) 01/29/2014   Snoring 01/29/2014   Dyslipidemia 01/29/2014   HTN (hypertension) 01/29/2014   Abnormal nuclear stress test 01/29/2014   Benign essential hypertension 08/15/2013    PCP[facility]   REFERRING PROVIDER: Johnanna Camie Mates, PA-C   REFERRING DIAG: [phone] (ICD-10-CM) - Spinal stenosis, cervical region   THERAPY DIAG:  Chronic low back pain with bilateral sciatica, unspecified back pain laterality  Cervicalgia  Rationale for Evaluation and Treatment: Rehabilitation  ONSET DATE:  03-13-24 s/p ACDF C4-6   SUBJECTIVE:                                                                                                                                                                                                         SUBJECTIVE STATEMENT: 06/04/24: I would like to work on strengthening my shoulders. My arms are so weak.    I am a 7/10  but this morning I was a 10/10 and I went in to pain management becsues My Right hip gave out and I was in so much pain.   Pain management says I need to get a Hip replacement but I need to lose weight. My arms are so tight too. I feel so tight all over. My family practice MD wants me to have echo cardiogram on August 27th. I just don't have a lot of energy and I can barely eat.   EVAL- I had neck surgery  03-13-24 s/p ACDF C4-6.  I was having black outs and the whole right side of my arms would be numbness and had tingling.  The  surgery went well and my arms have no more tingling and numbness but both my legs do have issues. I have neuropathy. I have two bad discs in my low back and I have pain in my Right hip and Left knee.  I have to lose weight. Pain  in neck , back Right hip and left knee.  Sometimes the pain takes my breathe away. I do not do exercise routinely. I only walk what I have to but not for exercise Hand dominance: Right  PERTINENT HISTORY:  morbid obesity, PE, DVT,  chronic pain, anemia, arthritis, CHF, DM, CAD, HTN, dyspnea, peripheral neuropathy, LE,  thoracic aortic ecstasia see med hx  PAIN:  Are you having pain? Yes: NPRS scale: back at rest 6/10 never a 0/10, at worst 10/10, neck pain at rest 2/10 at worst 10/10 Right hip at rest 3/10 at worst 10/10 and left knee at rest 6/10 and at worst 10/10 Pain location: back pain worst, then neck right hip and knee Pain description: Aching sometime sharp Aggravating factors: walking, be able to sleep in bed, any movement, turning neck, sleeping at night, turning in bed,  Relieving factors: some meds  PRECAUTIONS: Fall  RED FLAGS: Pt denies     WEIGHT BEARING RESTRICTIONS: No  FALLS:  Has patient fallen in last 6 months? No  LIVING ENVIRONMENT: Lives with: lives with their family and lives alone Lives in: House/apartment Stairs: Yes: External: 3 floors steps; on right going up pt uses elevator Has following equipment at home: Single point cane, Environmental Consultant - 4 wheeled, Tour manager, and Grab bars  OCCUPATION: Disabled since 2016 due to MI  PLOF: Pt has a caregiver that helps wash back and house duties, laundry, cleaning and some cooking and  sometimes needed for putting shirts on and compression socks  PATIENT GOALS: Get stronger to get back into the gym and at [employer]  NEXT MD VISIT: Primary MD  05-28-24  OBJECTIVE:  Note: Objective measures were completed at Evaluation unless otherwise noted.  DIAGNOSTIC FINDINGS:  EXAM: CERVICAL SPINE - 2-3 VIEW   COMPARISON:  09/02/2016, MRI 10/27/2023   FINDINGS: Eight low resolution intraoperative spot views of the cervical spine. Total fluoroscopy time was 42 seconds, fluoroscopic dose of 43.13 mGy.   The images  demonstrate anterior localizing instrument anterior to C5. Final images demonstrate anterior plate, fixating screws and interbody devices at the lower cervical spine. Curvilinear density on final image overlies the right lower cervical region   IMPRESSION: Intraoperative fluoroscopic assistance provided during cervical spine surgery.     Electronically Signed   By: Luke Bun M.D.   On: 03/13/2024 21:07    PATIENT SURVEYS:  Modified Oswestry:  MODIFIED OSWESTRY DISABILITY SCALE  Date: 05-16-24 Score  Pain intensity 3 =  Pain medication provides me with moderate relief from pain.  2. Personal care (washing, dressing, etc.) 3 =  I need help, but I am able to manage most of my personal care.  3. Lifting 4 = I can lift only very light weights  4. Walking 4 = I can only walk with crutches or a cane.  5. Sitting 2 =  Pain prevents me from sitting more than 1 hour.  6. Standing 4 =  Pain prevents me from standing more than 10 minutes.  7. Sleeping 2 =  Even when I take pain medication, I sleep less than 6 hours  8. Social Life 5 =  I have hardly any social life because of my  pain.  9. Traveling 2 =  My pain restricts my travel over 2 hours.  10. Employment/ Homemaking 2 = I can perform most of my homemaking/job duties, but pain prevents me from performing more physically stressful activities (eg, lifting, vacuuming).  Total 31/50   62%   Interpretation of scores: Score Category Description  0-20% Minimal Disability The patient can cope with most living activities. Usually no treatment is indicated apart from advice on lifting, sitting and exercise  21-40% Moderate Disability The patient experiences more pain and difficulty with sitting, lifting and standing. Travel and social life are more difficult and they may be disabled from work. Personal care, sexual activity and sleeping are not grossly affected, and the patient can usually be managed by conservative means  41-60% Severe  Disability Pain remains the main problem in this group, but activities of daily living are affected. These patients require a detailed investigation  61-80% Crippled Back pain impinges on all aspects of the patients life. Positive intervention is required  81-100% Bed-bound  These patients are either bed-bound or exaggerating their symptoms  Bluford FORBES Zoe DELENA Karon DELENA, et al. Surgery versus conservative management of stable thoracolumbar fracture: the PRESTO feasibility RCT. [place] ([place]): [employer]; 2021 Nov. [facility] Technology Assessment, No. 25.62.) Appendix 3, Oswestry Disability Index category descriptors. Available from: Findjewelers.cz  Minimally Clinically Important Difference (MCID) = 12.8% PSFS: THE PATIENT SPECIFIC FUNCTIONAL SCALE  Place score of 0-10 (0 = unable to perform activity and 10 = able to perform activity at the same level as before injury or problem)  Activity Date: 05-16-24    Return to gym for Exercise ([employer])       0    2.Walking for continuous 15 minutes without stopping        0    3.Be able to sleep in bed (recliner full time now)       0    4.      Total Score 0/3   0%      Total Score = Sum of activity scores/number of activities  Minimally Detectable Change: 3 points (for single activity); 2 points (for average score)  [facility] (nd). The Patient Specific Functional Scale . Retrieved from [email or web address].com.pt   COGNITION: Overall cognitive status: Within functional limits for tasks assessed  SENSATION: LE peripheral neuropathy sometimes to Great toes and bottom of feet. UE  WNL  POSTURE: rounded shoulders, forward head, decreased lumbar lordosis, flexed trunk , and morbid obesity  PALPATION: Cervical  TTP over cervical paraspinals Right  vs Left   CERVICAL ROM:   Active ROM A/PROM (deg) eval  Flexion 25  Extension 30*   Right lateral flexion 16 *  Left lateral flexion 19  Right rotation 40 *  Left rotation 45*   Key: WFL = within functional limits not formally assessed, * = concordant pain, s = stiffness/stretching sensation, NT = not tested)   (Blank rows = not tested, score listed is out of 5 possible points.  N = WNL, D = diminished, C = clear for gross weakness with myotome testing, * = concordant pain with testing)   UPPER EXTREMITY ROM:  Active ROM Right eval Left eval  Shoulder flexion 115/ P 120 135 /P144  Shoulder extension    Shoulder abduction 110 129  Shoulder adduction    Shoulder extension    Shoulder internal rotation    Shoulder external rotation    Elbow flexion    Elbow  extension    Wrist flexion    Wrist extension    Wrist ulnar deviation    Wrist radial deviation    Wrist pronation    Wrist supination     Key: WFL = within functional limits not formally assessed, * = concordant pain, s = stiffness/stretching sensation, NT = not tested)   (Blank rows = not tested, score listed is out of 5 possible points.  N = WNL, D = diminished, C = clear for gross weakness with myotome testing, * = concordant pain with testing)   UPPER EXTREMITY MMT:  MMT Right eval Left eval  Shoulder flexion 4- 4-  Shoulder extension    Shoulder abduction 4- 4-  Shoulder adduction    Shoulder extension    Shoulder internal rotation    Shoulder external rotation    Middle trapezius    Lower trapezius    Elbow flexion    Elbow extension    Wrist flexion    Wrist extension    Wrist ulnar deviation    Wrist radial deviation    Wrist pronation    Wrist supination    Grip strength    (Blank rows = not tested, score listed is out of 5 possible points.  N = WNL, D = diminished, C = clear for gross weakness with myotome testing, * = concordant pain with testing)   CERVICAL SPECIAL TESTS:  NT due to post surgery   LUMBAR ROM:   AROM eval  Flexion Fingertips to mid shin *  Extension  0 *  Right lateral flexion 10  *  Left lateral flexion 10*  Right rotation 50%*  Left rotation 50% *   Key: WFL = within functional limits not formally assessed, * = concordant pain, s = stiffness/stretching sensation, NT = not tested  LOWER EXTREMITY ROM:     Active  Right eval Left eval  Hip flexion 70standing 70 standing  Hip extension    Hip abduction    Hip adduction    Hip internal rotation    Hip external rotation    Knee flexion 102 98  Knee extension 11 degree ext deficit 20 degree ext deficit  Ankle dorsiflexion    Ankle plantarflexion    Ankle inversion    Ankle eversion      LOWER EXTREMITY MMT:    MMT Right eval Left eval  Hip flexion 3- 4-  Hip extension    Hip abduction 3- 4-  Hip adduction    Hip internal rotation    Hip external rotation    Knee flexion 4- 4-  Knee extension 3- 4  Ankle dorsiflexion    Ankle plantarflexion 0/25 0/25  Ankle inversion    Ankle eversion    (Blank rows = not tested, score listed is out of 5 possible points.  N = WNL, D = diminished, C = clear for gross weakness with myotome testing, * = concordant pain with testing)       FUNCTIONAL TESTS:  5 times sit to stand: 15.48 sec  2 minute walk test: 230 ft  Norm 538ft SpO2% 93%  TREATMENT DATE:  [facility] Adult PT Treatment:                                                DATE: 06/04/24 Therapeutic Exercise: Seated horiz abdct 10 x 3 Red  band  Seated bicep curls 5# each hand - thumb up, thumb out , 10 x 2 each  Seated Tricep press Blue band 10 x 2 each  Standing Row Black  10 x 2  Standing shoulder ext Black  10 x 2  Seated lumbar flexion roll outs with ball forward and lateral  Seated upper trunk rotations  Standing heel raises 10 x 2  Standing hip abduction 10 x 2 each  Standing hip ext 10 x 2 each  Updated HEP     [facility] Adult PT Treatment:                                                DATE: 05-30-24 Therapeutic Exercise: With Pball seated forward flexion 2 x 5  with 5  hold With pball seated r and Left flexion 2 x 5 with 5 hold  Pain on the right worse than left. Seated hamstring curls with GTB 1 x 15 on R and L Knee extension resisted on Right and left with GTB 2 x 10 each Supine march 1 x 10  right and Left  Manual Therapy: LAD of Right LE with decreased pain using towel at ankle  Therapeutic Activity: STS without using UE 2 x 10   [facility] Adult PT Treatment:                                                DATE: 05-28-24 Therapeutic Exercise: Seated hamstring curls with GTB 1 x 10 on R and L Knee extension resisted on Right and left with GTB 2 x 10 each Seated lumbar rotation with UE elevated 90 degree flexion Lumbar flexion to R and L and forward for lumbar stretch Seated Cervical AROM flex/ext, side bending R and L and rotation to R and L 10 x each  Therapeutic Activity: In ll bars for safety, sit to stand  2 x 5  2 min rest and then 1 x 6 Heel raises in ll bars with UE support STanding march in ll bars Standing backward march in llbars Standing hip extension with RTB 2 x 10 bil Standing hip abduction with RTB 2 x 10 bil  Self Care: Education on hydration, nutrition, sleep and movement requirements for health.  Importance of progressive strength and standing tolerance    05-16-24  EVAL  issue HEP                                                                                                                                 PATIENT EDUCATION:  Education details: POC explanation of findings, issue HEP Person educated: Patient Education method: Explanation, Demonstration, Tactile cues, Verbal cues, and Handouts  Education comprehension: verbalized understanding, returned demonstration, verbal cues required, tactile cues required, and needs further education  HOME EXERCISE PROGRAM: Access Code: [email or web address] URL: [email or web address] Date: 05/16/2024 Prepared by: Graydon Dingwall  Exercises - Sit to stand with sink  support Movement snack  - 1 x daily - 7 x weekly - 3 sets - 10 reps - Seated Cervical Rotation AROM  - 1 x daily - 7 x weekly - 1 sets - 10 reps - Seated Cervical Extension AROM  - 1 x daily - 7 x weekly - 1 sets - 10 reps - Standing Cervical Sidebending AROM  - 1 x daily - 7 x weekly - 1 sets - 10 reps - Seated Trunk Rotation - Arms Crossed  - 1 x daily - 7 x weekly - 2 sets - 10 reps - Seated Lumbar flexion forward and to side  - 2 x daily - 7 x weekly - 1 sets - 10 reps Added 05-28-24  - Standing Hip Extension with Resistance at Ankles and Counter Support  - 1 x daily - 7 x weekly - 3 sets - 10 reps - Standing Hip Abduction with Resistance at Ankles and Counter Support  - 1 x daily - 7 x weekly - 3 sets - 10 reps - Heel Raises with Counter Support  - 1 x daily - 7 x weekly - 3 sets - 10 reps 06/04/24 - Standing Shoulder Row with Anchored Resistance  - 1 x daily - 7 x weekly - 3 sets - 10 reps - Shoulder Extension with Resistance - Palms Forward  - 1 x daily - 7 x weekly - 3 sets - 10 reps - Alternating star pattern  - 1 x daily - 7 x weekly - 2-3 sets - 10 reps - Seated Bicep Curls Supinated with Dumbbells  - 1 x daily - 7 x weekly - 3 sets - 10 reps - Standing Elbow Extension with Self-Anchored Resistance  - 1 x daily - 7 x weekly - 2-3 sets - 10 reps  ASSESSMENT:  CLINICAL IMPRESSION: Pt requests to work on upper body strength to improve his ability to push and pull himself up. Worked mostly in seated for comfort. He was able to complete standing scapular work briefly, with one seated rest break due to increased back pain. His HEP was updated to include a program for shoulder and elbow strengthening. Will continue to establish an exercise HEP which he can tolerate on a regular bases to improve his activity tolerance. Does he want to schedule aquatics? Assess response to this session.    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 Pt states he had to go to pain management and has an echocardiogram  scheduled for later in month.  He felt pain in his hip and it gave out today and he declined doing standing exercises this afternoon. Pt demonstrates fatigue early in session and requests to end session in order to go home and rest.  Pt did not want to miss PT.   Pt did have some relief of pain in Left hip with LAD. Pt is morbidly obese and would benefit from continuing some session in aquatics in order to increase more pain free motion and movement.  Pt needs to be able to find exercises in which he can be consistent in order to achieve his goals.  Will continue to progress as pt is able    EVAL- Patient is a 63 y.o. male who was seen today for physical therapy evaluation and  treatment for cervical stenosis and post op since 03-13-24 s/p ACDF C4-6  for cervical myelopathy. Pt shows weakness globally and difficulty walking mostly limited by endurance and dyspnea. Pt reports he used to work out at [employer] [employer] and he would like to become strong enough to return to exercise at the gym. He reports he lives at home alone with an aide that assist 3 days/week with ADLs. He wheels around using the rollator in the home and uses a cane in the community for ambulation. He reports using occassional O2.  Mr. Beehler will benefit form skilled PT to maximize functional mobility and maximize strength for more active lifestyle and well as fall preparedness since he lives alone in home.  OBJECTIVE IMPAIRMENTS: cardiopulmonary status limiting activity, decreased activity tolerance, decreased balance, decreased knowledge of condition, decreased knowledge of use of DME, decreased mobility, difficulty walking, decreased ROM, decreased strength, impaired sensation, impaired UE functional use, improper body mechanics, postural dysfunction, and obesity. pain  ACTIVITY LIMITATIONS: carrying, lifting, bending, sitting, standing, squatting, sleeping, stairs, transfers, bed mobility, reach over head, and locomotion  level  PARTICIPATION LIMITATIONS: meal prep, cleaning, interpersonal relationship, community activity, and walking for exercise  PERSONAL FACTORS: morbid obesity, PE, DVT,  chronic pain, anemia, arthritis, CHF, DM, CAD, HTN, dyspnea, peripheral neuropathy, LE,  thoracic aortic ecstasia see med hx are also affecting patient's functional outcome.   REHAB POTENTIAL: Good  CLINICAL DECISION MAKING: Evolving/moderate complexity  EVALUATION COMPLEXITY: Moderate   GOALS: Goals reviewed with patient? Yes  SHORT TERM GOALS: Target date: 06-06-24  Pt will demonstrate appropriate understanding and performance of initially prescribed HEP in order to facilitate improved independence with management of symptoms.  Baseline: no knowledge Goal status: INITIAL  2.   Pt will perform 5xSTS in <13.0 sec in order to demonstrate reduced fall risk and improved functional independence. (MCID of 2.3sec) Baseline: eval 15.48 sec Goal status: INITIAL  3.  Report pain decrease from 10/10 to 5/10 with functional activities Baseline: 10/10 on eval Goal status: INITIAL    LONG TERM GOALS: Target date: 06-27-24  Pt will be independent with advanced HEP and demonstrate use of gym equipment Baseline: Pt unable to exercise or return to [employer] Goal status: INITIAL  2.  Pt will demonstrated increased knee and hip strength  with by MCID of 27ft as indication of improved functional mobility strength grade of the Bil LE for improved hip/knee stability and function Baseline: eval 230 ft  ( norm 588 ft) Goal status: INITIAL  3.  Pt will be able to walk continuously for 15 minutes without rest break in order to improve ability to walk in store Baseline: Pt with dypnea at 93% after 2 MWT Goal status: INITIAL  4.  Pt will be able to perform standing to supine transfers and tolerate exrcises in supine in order to return to bed for sleep Baseline: Pt sleeps in recliner full time Goal status: INITIAL  5.   Pt. will show a >/= 12 pt improvement in their ODI score (MCID is 12 pts) as a proxy for functional improvement Baseline: 31/50 62% Goal status: INITIAL  6. PSFS score with increase to at least 2 points for average score for MDIC.   Baseline: 0/3  0%  Goal Status  INITIAL     PLAN:  PT FREQUENCY: 1-2x/week  PT DURATION: 6 weeks  PLANNED INTERVENTIONS: 97164- PT Re-evaluation, 97750- Physical Performance Testing, 97110-Therapeutic exercises, 97530- Therapeutic activity, [id number]- Neuromuscular re-education, 97535- Self Care, 02859- Manual therapy,  02883- Gait training, 02886- Aquatic Therapy, [phone]- Electrical stimulation (unattended), [phone] (1-2 muscles), 20561 (3+ muscles)- Dry Needling, Patient/Family education, Balance training, Stair training, Taping, Joint mobilization, Spinal mobilization, DME instructions, Cryotherapy, and Moist heat  PLAN FOR NEXT SESSION: Balance, strengthening and progress HEP and fall preparedness   Harlene Persons, PTA 06/04/24 3:56 PM Phone: [phone] Fax: [phone]   For all possible CPT codes, reference the Planned Interventions line above.     Check all conditions that are expected to impact treatment: {Conditions expected to impact treatment:Morbid obesity, Respiratory disorders, Diabetes mellitus, Musculoskeletal disorders, and Neurological condition and/or seizures

## 2024-06-05 ENCOUNTER — Encounter: Admitting: Physical Therapy

## 2024-06-06 ENCOUNTER — Ambulatory Visit: Admitting: Physical Therapy

## 2024-06-10 ENCOUNTER — Other Ambulatory Visit: Payer: Self-pay | Admitting: Gastroenterology

## 2024-06-10 ENCOUNTER — Ambulatory Visit: Admitting: Adult Health

## 2024-06-11 ENCOUNTER — Ambulatory Visit: Admitting: Physical Therapy

## 2024-06-13 ENCOUNTER — Ambulatory Visit: Admitting: Physical Therapy

## 2024-06-19 ENCOUNTER — Ambulatory Visit (HOSPITAL_COMMUNITY)
Admission: RE | Admit: 2024-06-19 | Discharge: 2024-06-19 | Disposition: A | Discharge status: Home / Self Care | Source: Ambulatory Visit | Attending: Student | Admitting: Student

## 2024-06-19 DIAGNOSIS — I77810 Thoracic aortic ectasia: Secondary | ICD-10-CM | POA: Diagnosis not present

## 2024-06-19 DIAGNOSIS — I110 Hypertensive heart disease with heart failure: Secondary | ICD-10-CM | POA: Diagnosis not present

## 2024-06-19 DIAGNOSIS — G4733 Obstructive sleep apnea (adult) (pediatric): Secondary | ICD-10-CM | POA: Diagnosis not present

## 2024-06-19 DIAGNOSIS — I5032 Chronic diastolic (congestive) heart failure: Secondary | ICD-10-CM | POA: Diagnosis present

## 2024-06-19 DIAGNOSIS — E119 Type 2 diabetes mellitus without complications: Secondary | ICD-10-CM | POA: Diagnosis not present

## 2024-06-19 DIAGNOSIS — E785 Hyperlipidemia, unspecified: Secondary | ICD-10-CM | POA: Diagnosis not present

## 2024-06-20 LAB — ECHOCARDIOGRAM COMPLETE
Area-P 1/2: 3.24 cm2
S' Lateral: 2.9 cm

## 2024-06-23 ENCOUNTER — Emergency Department (HOSPITAL_COMMUNITY)
Admission: EM | Admit: 2024-06-23 | Discharge: 2024-06-23 | Disposition: A | Discharge status: Home / Self Care | Attending: Emergency Medicine | Admitting: Emergency Medicine

## 2024-06-23 ENCOUNTER — Other Ambulatory Visit: Payer: Self-pay

## 2024-06-23 ENCOUNTER — Emergency Department (HOSPITAL_COMMUNITY)

## 2024-06-23 ENCOUNTER — Encounter (HOSPITAL_COMMUNITY): Payer: Self-pay

## 2024-06-23 DIAGNOSIS — R079 Chest pain, unspecified: Secondary | ICD-10-CM

## 2024-06-23 DIAGNOSIS — R0602 Shortness of breath: Secondary | ICD-10-CM | POA: Diagnosis not present

## 2024-06-23 DIAGNOSIS — E119 Type 2 diabetes mellitus without complications: Secondary | ICD-10-CM | POA: Insufficient documentation

## 2024-06-23 DIAGNOSIS — Z8673 Personal history of transient ischemic attack (TIA), and cerebral infarction without residual deficits: Secondary | ICD-10-CM | POA: Insufficient documentation

## 2024-06-23 DIAGNOSIS — I110 Hypertensive heart disease with heart failure: Secondary | ICD-10-CM | POA: Insufficient documentation

## 2024-06-23 DIAGNOSIS — I5032 Chronic diastolic (congestive) heart failure: Secondary | ICD-10-CM | POA: Diagnosis not present

## 2024-06-23 DIAGNOSIS — I2510 Atherosclerotic heart disease of native coronary artery without angina pectoris: Secondary | ICD-10-CM | POA: Diagnosis not present

## 2024-06-23 DIAGNOSIS — R0789 Other chest pain: Secondary | ICD-10-CM | POA: Insufficient documentation

## 2024-06-23 LAB — CBC
HCT: 39.9 % (ref 39.0–52.0)
Hemoglobin: 13.3 g/dL (ref 13.0–17.0)
MCH: 31.8 pg (ref 26.0–34.0)
MCHC: 33.3 g/dL (ref 30.0–36.0)
MCV: 95.5 fL (ref 80.0–100.0)
Platelets: 163 K/uL (ref 150–400)
RBC: 4.18 MIL/uL — ABNORMAL LOW (ref 4.22–5.81)
RDW: 13.5 % (ref 11.5–15.5)
WBC: 9 K/uL (ref 4.0–10.5)
nRBC: 0 % (ref 0.0–0.2)

## 2024-06-23 LAB — BASIC METABOLIC PANEL WITH GFR
Anion gap: 18 — ABNORMAL HIGH (ref 5–15)
BUN: 16 mg/dL (ref 6–20)
CO2: 28 mmol/L (ref 22–32)
Calcium: 8.4 mg/dL — ABNORMAL LOW (ref 8.9–10.3)
Chloride: 89 mmol/L — ABNORMAL LOW (ref 98–111)
Creatinine, Ser: 1.01 mg/dL (ref 0.61–1.24)
GFR, Estimated: >60 mL/min (ref >60)
Glucose, Bld: 295 mg/dL — ABNORMAL HIGH (ref 70–99)
Potassium: 2.9 mmol/L — ABNORMAL LOW (ref 3.5–5.1)
Sodium: 135 mmol/L (ref 135–145)

## 2024-06-23 LAB — TROPONIN I (HIGH SENSITIVITY)
Troponin I (High Sensitivity): 13 ng/L (ref <18)
Troponin I (High Sensitivity): 13 ng/L (ref <18)

## 2024-06-23 LAB — BRAIN NATRIURETIC PEPTIDE: B Natriuretic Peptide: 13.7 pg/mL (ref 0.0–100.0)

## 2024-06-23 MED ORDER — ACETAMINOPHEN 500 MG PO TABS
1000.0000 mg | ORAL_TABLET | Freq: Once | ORAL | Status: DC
  Filled 2024-06-23 (×2): qty 2

## 2024-06-23 MED ORDER — OXYCODONE HCL 5 MG PO TABS
5.0000 mg | ORAL_TABLET | Freq: Once | ORAL | Status: AC
  Administered 2024-06-23: 5 mg via ORAL
  Filled 2024-06-23: qty 1

## 2024-06-23 MED ORDER — POTASSIUM CHLORIDE CRYS ER 20 MEQ PO TBCR
40.0000 meq | EXTENDED_RELEASE_TABLET | Freq: Once | ORAL | Status: AC
  Administered 2024-06-23: 40 meq via ORAL
  Filled 2024-06-23: qty 2

## 2024-06-23 NOTE — ED Provider Notes (Signed)
 [facility] Emergency Department Provider Note MRN:  [id number]  Arrival date & time: 06/23/24     Chief Complaint   Chest pain History of Present Illness   [name] is a 63 y.o. year-old male with a history of CAD, CHF presenting to the ED with chief complaint of chest pain.  Patient had some shortness of breath this evening followed by some chest discomfort, followed by significant coughing spell prompting EMS call.  Had increase his oxygen  at home during the coughing spell.  Feeling better now and would like to go home.  Denies recent fever.  Review of Systems  A thorough review of systems was obtained and all systems are negative except as noted in the HPI and PMH.   Patient's Health History    Past Medical History:  Diagnosis Date   Anemia    Arthritis    Back pain    CAD (coronary artery disease)    a. s/p DES to LAD in 05/2016   Cervical radiculopathy    Chronic diastolic CHF (congestive heart failure) (HCC)    Chronic pain    Deep vein thrombosis (HCC) 01/06/2016   Formatting of this note might be different from the original.  Formatting of this note might be different from the original. Last Assessment & Plan: Management per primary care. Last Assessment & Plan: Management per primary care.  Formatting of this note might be different from the original. Last Assessment & Plan: Management per primary care. Last Assessment & Plan: Management per primary care.   Depression    DVT (deep venous thrombosis) (HCC)    Dyspnea    GERD (gastroesophageal reflux disease)    Hematemesis    Hepatic steatosis    Hyperlipidemia    Hypertension    IBS (irritable bowel syndrome)    Morbid obesity (HCC)    Myocardial infarction (HCC)    OSA (obstructive sleep apnea)    oxygen  4L/[place] at night and PRN   Pancreatitis    PE (pulmonary thromboembolism) (HCC)    Peripheral neuropathy    PUD (peptic ulcer disease)    Pulmonary embolism (HCC) 07/15/2021    Renal disorder    Stroke [facility])    a. ?details unclear - not seen on imaging when he was admitted in 05/2017 for TIA symptoms which were felt due to cervical radiculopathy.   Thoracic aortic ectasia (HCC)    a. 4.3cm ectatic ascending thoracic aorta by CT 06/2017.    Type 2 diabetes mellitus (HCC)     Past Surgical History:  Procedure Laterality Date   ANTERIOR CERVICAL CORPECTOMY N/A 03/13/2024   Procedure: ANTERIOR CERVICAL DECOMPRESSION AND FUSION WITH PLATING CERVICAL FOUR-CERVICAL FIVE/CERVICAL FIVE-CERVICAL SIX;  Surgeon: Debby Dorn MATSU, MD;  Location[facility];  Service: Neurosurgery;  Laterality: N/A;   CARDIAC CATHETERIZATION N/A 05/31/2016   Procedure: Left Heart Cath and Coronary Angiography;  Surgeon: Peter M Swaziland, MD;  Location: [facility] INVASIVE CV LAB;  Service: Cardiovascular;  Laterality: N/A;   CARDIAC CATHETERIZATION N/A 05/31/2016   Procedure: Intravascular Pressure Wire/FFR Study;  Surgeon: Peter M Swaziland, MD;  Location: [facility] INVASIVE CV LAB;  Service: Cardiovascular;  Laterality: N/A;   CARDIAC CATHETERIZATION N/A 05/31/2016   Procedure: Coronary Stent Intervention;  Surgeon: Peter M Swaziland, MD;  Location: [facility] INVASIVE CV LAB;  Service: Cardiovascular;  Laterality: N/A;   COLONOSCOPY N/A 07/17/2021   Procedure: COLONOSCOPY;  Surgeon: Dianna Specking, MD;  Location: [facility] ENDOSCOPY;  Service: Endoscopy;  Laterality: N/A;  COLONOSCOPY WITH PROPOFOL  N/A 04/29/2020   Procedure: COLONOSCOPY WITH PROPOFOL ;  Surgeon: Dianna Specking, MD;  Location: [facility] ENDOSCOPY;  Service: Endoscopy;  Laterality: N/A;   ESOPHAGOGASTRODUODENOSCOPY N/A 04/29/2020   Procedure: ESOPHAGOGASTRODUODENOSCOPY (EGD);  Surgeon: Dianna Specking, MD;  Location: [facility] ENDOSCOPY;  Service: Endoscopy;  Laterality: N/A;   ESOPHAGOGASTRODUODENOSCOPY (EGD) WITH PROPOFOL  N/A 07/17/2021   Procedure: ESOPHAGOGASTRODUODENOSCOPY (EGD) WITH PROPOFOL ;  Surgeon: Dianna Specking, MD;  Location: [facility] ENDOSCOPY;  Service: Endoscopy;  Laterality:  N/A;   GIVENS CAPSULE STUDY N/A 11/10/2022   Procedure: GIVENS CAPSULE STUDY;  Surgeon: Elicia Claw, MD;  Location: [facility] ENDOSCOPY;  Service: Gastroenterology;  Laterality: N/A;   LEFT HEART CATH AND CORONARY ANGIOGRAPHY N/A 12/08/2017   Procedure: LEFT HEART CATH AND CORONARY ANGIOGRAPHY;  Surgeon: Anner Alm ORN, MD;  Location: [facility] INVASIVE CV LAB;  Service: Cardiovascular;  Laterality: N/A;   LEFT HEART CATHETERIZATION WITH CORONARY ANGIOGRAM N/A 02/03/2014   Procedure: LEFT HEART CATHETERIZATION WITH CORONARY ANGIOGRAM;  Surgeon: Vinie KYM Maxcy, MD;  Location: [facility] CATH LAB;  Service: Cardiovascular;  Laterality: N/A;   left leg stent      POLYPECTOMY  04/29/2020   Procedure: POLYPECTOMY;  Surgeon: Dianna Specking, MD;  Location: [facility] ENDOSCOPY;  Service: Endoscopy;;   POLYPECTOMY  07/17/2021   Procedure: POLYPECTOMY;  Surgeon: Dianna Specking, MD;  Location: [facility] ENDOSCOPY;  Service: Endoscopy;;    Family History  Problem Relation Age of Onset   Cancer Father    Hypertension Mother    Diabetes Mother    Breast cancer Mother    Hypertension Brother    Diabetes Brother    Hypertension Sister    Diabetes Sister     Social History   Socioeconomic History   Marital status: Single    Spouse name: Not on file   Number of children: 3   Years of education: 12   Highest education level: Not on file  Occupational History   Occupation: Disabled  Tobacco Use   Smoking status: Never    Passive exposure: Past   Smokeless tobacco: Never  Vaping Use   Vaping status: Never Used  Substance and Sexual Activity   Alcohol  use: No   Drug use: Not Currently    Types: Marijuana   Sexual activity: Not Currently    Partners: Female    Comment: SINGLE  Other Topics Concern   Not on file  Social History Narrative   Independent and ambulatory with cane.   Lives at home alone.   Right-handed.   1-2 cups caffeine per day.   Social Drivers of Corporate investment banker Strain: Not on  File (02/10/2022)   Received from [employer]    Financial Resource Strain: 0  Food Insecurity: Low Risk  (04/19/2024)   Received from [facility]   Hunger Vital Sign    Within the past 12 months, you worried that your food would run out before you got money to buy more: Never true    Within the past 12 months, the food you bought just didn't last and you didn't have money to get more. : Never true  Transportation Needs: No Transportation Needs (04/19/2024)   Received from [facility]    In the past 12 months, has lack of reliable transportation kept you from medical appointments, meetings, work or from getting things needed for daily living? : No  Recent Concern: Transportation Needs - Unmet Transportation Needs (03/20/2024)   PRAPARE - Transportation  Lack of Transportation (Medical): Yes    Lack of Transportation (Non-Medical): Yes  Physical Activity: Not on File (02/10/2022)   Received from [facility]   Physical Activity    Physical Activity: 0  Stress: Not on File (02/10/2022)   Received from [facility]   Stress    Stress: 0  Social Connections: Not on File (07/06/2023)   Received from [facility]   Social Connections    Connectedness: 0  Intimate Partner Violence: Patient Unable To Answer (03/20/2024)   Humiliation, Afraid, Rape, and Kick questionnaire    Fear of Current or Ex-Partner: Patient unable to answer    Emotionally Abused: Patient unable to answer    Physically Abused: Patient unable to answer    Sexually Abused: Patient unable to answer     Physical Exam   Vitals:   06/23/24 0430 06/23/24 0500  BP: 139/65 106/83  Pulse: 78 79  Resp: 17 18  Temp:    SpO2: 94% 94%    CONSTITUTIONAL: Chronically ill-appearing, NAD NEURO/PSYCH:  Alert and oriented x 3, no focal deficits EYES:  eyes equal and reactive ENT/NECK:  no LAD, no JVD CARDIO: Regular rate, well-perfused, normal S1 and S2 PULM:  CTAB no wheezing or rhonchi GI/GU:   non-distended, non-tender MSK/SPINE:  No gross deformities, no edema SKIN:  no rash, atraumatic   *Additional and/or pertinent findings included in MDM below  Diagnostic and Interventional Summary    EKG Interpretation Date/Time:  Sunday June 23 2024 02:03:33 EDT Ventricular Rate:  80 PR Interval:  172 QRS Duration:  86 QT Interval:  455 QTC Calculation: 525 R Axis:   39  Text Interpretation: Sinus rhythm Consider anterior infarct Prolonged QT interval Confirmed by Theadore Sharper [phone]) on 06/23/2024 3:14:07 AM       Labs Reviewed  BASIC METABOLIC PANEL WITH GFR - Abnormal; Notable for the following components:      Result Value   Potassium 2.9 (*)    Chloride 89 (*)    Glucose, Bld 295 (*)    Calcium  8.4 (*)    Anion gap 18 (*)    All other components within normal limits  CBC - Abnormal; Notable for the following components:   RBC 4.18 (*)    All other components within normal limits  BRAIN NATRIURETIC PEPTIDE  TROPONIN I (HIGH SENSITIVITY)  TROPONIN I (HIGH SENSITIVITY)    DG Chest 2 View  Final Result      Medications  acetaminophen  (TYLENOL ) tablet 1,000 mg (1,000 mg Oral Patient Refused/Not Given 06/23/24 0424)  oxyCODONE  (Oxy IR/ROXICODONE ) immediate release tablet 5 mg (has no administration in time range)  potassium chloride  SA (KLOR-CON  M) CR tablet 40 mEq (40 mEq Oral Given 06/23/24 0514)     Procedures  /  Critical Care Procedures  ED Course and Medical Decision Making  Initial Impression and Ddx Differential diagnosis includes CHF exacerbation, viral illness, pneumonia.  History of PE however doubt acute PE given that he is on his baseline oxygen  (4 L), no tachycardia, he is feeling a lot better after the coughing has subsided, and he is already anticoagulated.  ACS considered as well.  Awaiting labs.  Past medical/surgical history that increases complexity of ED encounter: CHF  Interpretation of Diagnostics I personally reviewed the EKG and my  interpretation is as follows: Sinus rhythm without ischemic findings  No significant blood count or electrolyte disturbance.  Troponin negative x 2  Patient Reassessment and Ultimate Disposition/Management     Patient continues to look comfortable,  on baseline oxygen .  Doubt cardiac cause of pain.  Appropriate for discharge.  Patient management required discussion with the following services or consulting groups:  None  Complexity of Problems Addressed Acute illness or injury that poses threat of life of bodily function  Additional Data Reviewed and Analyzed Further history obtained from: Prior labs/imaging results  Additional Factors Impacting ED Encounter Risk Consideration of hospitalization  Ozell HERO. Theadore, MD [facility] Emergency Medicine [facility] [email or web address]  Final Clinical Impressions(s) / ED Diagnoses     ICD-10-CM   1. Chest pain, unspecified type  R07.9       ED Discharge Orders     None        Discharge Instructions Discussed with and Provided to Patient:     Discharge Instructions      You were evaluated in the Emergency Department and after careful evaluation, we did not find any emergent condition requiring admission or further testing in the hospital.  Your exam/testing today is overall reassuring.  Recommend follow-up with your regular doctors to discuss her symptoms.  Please return to the Emergency Department if you experience any worsening of your condition.   Thank you for allowing us  to be a part of your care.       Theadore Ozell HERO, MD 06/23/24 [phone]

## 2024-06-23 NOTE — Discharge Instructions (Signed)
 You were evaluated in the Emergency Department and after careful evaluation, we did not find any emergent condition requiring admission or further testing in the hospital.  Your exam/testing today is overall reassuring.  Recommend follow-up with your regular doctors to discuss her symptoms.  Please return to the Emergency Department if you experience any worsening of your condition.   Thank you for allowing us  to be a part of your care.

## 2024-06-23 NOTE — ED Notes (Signed)
 CCMD called and patient placed on monitor

## 2024-06-23 NOTE — ED Triage Notes (Addendum)
 Coming from home reports CP/SOB x3 hrs. Biggest concern from pt is new cough with issues.pt also endorses neck pain also with R sided hip pain. Uses 4L O2 PRN EMS  96% O2 on RA 100% on O2 4L

## 2024-07-02 ENCOUNTER — Encounter (HOSPITAL_COMMUNITY): Payer: Self-pay | Admitting: Gastroenterology

## 2024-07-09 ENCOUNTER — Encounter (HOSPITAL_COMMUNITY)
Admission: RE | Disposition: A | Discharge status: Home / Self Care | Payer: Self-pay | Source: Home / Self Care | Attending: Gastroenterology

## 2024-07-09 ENCOUNTER — Ambulatory Visit (HOSPITAL_COMMUNITY)
Admission: RE | Admit: 2024-07-09 | Discharge: 2024-07-09 | Disposition: A | Discharge status: Home / Self Care | Attending: Gastroenterology | Admitting: Gastroenterology

## 2024-07-09 ENCOUNTER — Ambulatory Visit (HOSPITAL_BASED_OUTPATIENT_CLINIC_OR_DEPARTMENT_OTHER): Admitting: Anesthesiology

## 2024-07-09 ENCOUNTER — Ambulatory Visit (HOSPITAL_COMMUNITY): Admitting: Anesthesiology

## 2024-07-09 ENCOUNTER — Other Ambulatory Visit: Payer: Self-pay

## 2024-07-09 ENCOUNTER — Encounter (HOSPITAL_COMMUNITY): Payer: Self-pay | Admitting: Gastroenterology

## 2024-07-09 DIAGNOSIS — Z860101 Personal history of adenomatous and serrated colon polyps: Secondary | ICD-10-CM | POA: Diagnosis not present

## 2024-07-09 DIAGNOSIS — K2970 Gastritis, unspecified, without bleeding: Secondary | ICD-10-CM | POA: Insufficient documentation

## 2024-07-09 DIAGNOSIS — Z86718 Personal history of other venous thrombosis and embolism: Secondary | ICD-10-CM | POA: Diagnosis not present

## 2024-07-09 DIAGNOSIS — K640 First degree hemorrhoids: Secondary | ICD-10-CM | POA: Insufficient documentation

## 2024-07-09 DIAGNOSIS — I2510 Atherosclerotic heart disease of native coronary artery without angina pectoris: Secondary | ICD-10-CM

## 2024-07-09 DIAGNOSIS — Z955 Presence of coronary angioplasty implant and graft: Secondary | ICD-10-CM | POA: Diagnosis not present

## 2024-07-09 DIAGNOSIS — E119 Type 2 diabetes mellitus without complications: Secondary | ICD-10-CM | POA: Insufficient documentation

## 2024-07-09 DIAGNOSIS — K921 Melena: Secondary | ICD-10-CM | POA: Insufficient documentation

## 2024-07-09 DIAGNOSIS — K219 Gastro-esophageal reflux disease without esophagitis: Secondary | ICD-10-CM | POA: Diagnosis not present

## 2024-07-09 DIAGNOSIS — G4733 Obstructive sleep apnea (adult) (pediatric): Secondary | ICD-10-CM | POA: Insufficient documentation

## 2024-07-09 DIAGNOSIS — E785 Hyperlipidemia, unspecified: Secondary | ICD-10-CM | POA: Insufficient documentation

## 2024-07-09 DIAGNOSIS — I5032 Chronic diastolic (congestive) heart failure: Secondary | ICD-10-CM | POA: Diagnosis not present

## 2024-07-09 DIAGNOSIS — I10 Essential (primary) hypertension: Secondary | ICD-10-CM

## 2024-07-09 DIAGNOSIS — Z7984 Long term (current) use of oral hypoglycemic drugs: Secondary | ICD-10-CM | POA: Insufficient documentation

## 2024-07-09 DIAGNOSIS — Z6841 Body Mass Index (BMI) 40.0 and over, adult: Secondary | ICD-10-CM | POA: Insufficient documentation

## 2024-07-09 DIAGNOSIS — Z794 Long term (current) use of insulin: Secondary | ICD-10-CM | POA: Diagnosis not present

## 2024-07-09 DIAGNOSIS — I252 Old myocardial infarction: Secondary | ICD-10-CM | POA: Diagnosis not present

## 2024-07-09 DIAGNOSIS — Z09 Encounter for follow-up examination after completed treatment for conditions other than malignant neoplasm: Secondary | ICD-10-CM | POA: Diagnosis present

## 2024-07-09 DIAGNOSIS — Z86711 Personal history of pulmonary embolism: Secondary | ICD-10-CM | POA: Diagnosis not present

## 2024-07-09 DIAGNOSIS — I110 Hypertensive heart disease with heart failure: Secondary | ICD-10-CM | POA: Diagnosis not present

## 2024-07-09 HISTORY — PX: ESOPHAGOGASTRODUODENOSCOPY: SHX5428

## 2024-07-09 HISTORY — PX: BIOPSY OF SKIN SUBCUTANEOUS TISSUE AND/OR MUCOUS MEMBRANE: SHX6741

## 2024-07-09 HISTORY — PX: COLONOSCOPY: SHX5424

## 2024-07-09 LAB — GLUCOSE, CAPILLARY: Glucose-Capillary: 235 mg/dL — ABNORMAL HIGH (ref 70–99)

## 2024-07-09 SURGERY — COLONOSCOPY
Anesthesia: Monitor Anesthesia Care

## 2024-07-09 MED ORDER — PROPOFOL 500 MG/50ML IV EMUL
INTRAVENOUS | Status: DC | PRN
  Administered 2024-07-09: 125 ug/kg/min via INTRAVENOUS

## 2024-07-09 MED ORDER — SODIUM CHLORIDE 0.9 % IV SOLN
INTRAVENOUS | Status: DC
Start: 2024-07-09 — End: 2024-07-09

## 2024-07-09 MED ORDER — LIDOCAINE 2% (20 MG/ML) 5 ML SYRINGE
INTRAMUSCULAR | Status: DC | PRN
  Administered 2024-07-09: 100 mg via INTRAVENOUS

## 2024-07-09 MED ORDER — LACTATED RINGERS IV SOLN
INTRAVENOUS | Status: AC | PRN
  Administered 2024-07-09: 20 mL/h via INTRAVENOUS

## 2024-07-09 MED ORDER — PROPOFOL 10 MG/ML IV BOLUS
INTRAVENOUS | Status: AC
  Filled 2024-07-09: qty 20

## 2024-07-09 MED ORDER — PROPOFOL 10 MG/ML IV BOLUS
INTRAVENOUS | Status: DC | PRN
Start: 2024-07-09 — End: 2024-07-09
  Administered 2024-07-09 (×3): 50 mg via INTRAVENOUS

## 2024-07-09 NOTE — H&P (Signed)
 Date of Initial H&P: 07/03/24  History reviewed, patient examined, no change in status, stable for surgery.

## 2024-07-09 NOTE — Op Note (Signed)
 [facility] Patient Name: [name] Procedure Date: 07/09/2024 MRN: [id number] Attending MD: Jerrell JAYSON Sol , MD, [id number] Date of Birth: 03/11/1961 CSN: [id number] Age: 63 Admit Type: Ambulatory Procedure:                Colonoscopy Indications:              Last colonoscopy: September 2022, Hematochezia,                            Follow-up for history of adenomatous polyps in the                            colon Providers:                Jerrell KYM Sol, MD, Gregoria Pierce, RN,                            Fairy Marina, Technician Referring MD:              Medicines:                Propofol  per Anesthesia, Monitored Anesthesia Care Complications:            No immediate complications. Estimated Blood Loss:     Estimated blood loss: none. Procedure:                Pre-Anesthesia Assessment:                           - Prior to the procedure, a History and Physical                            was performed, and patient medications and                            allergies were reviewed. The patient's tolerance of                            previous anesthesia was also reviewed. The risks                            and benefits of the procedure and the sedation                            options and risks were discussed with the patient.                            All questions were answered, and informed consent                            was obtained. Prior Anticoagulants: The patient has                            taken Eliquis  (apixaban ), last dose was 2 days  prior to procedure. ASA Grade Assessment: III - A                            patient with severe systemic disease. After                            reviewing the risks and benefits, the patient was                            deemed in satisfactory condition to undergo the                            procedure.                           After obtaining informed  consent, the colonoscope                            was passed under direct vision. Throughout the                            procedure, the patient's blood pressure, pulse, and                            oxygen  saturations were monitored continuously. The                            [id number] ([id number]) colonoscope was introduced                            through the anus and advanced to the the cecum,                            identified by appendiceal orifice and ileocecal                            valve. The colonoscopy was somewhat difficult due                            to a tortuous colon. Successful completion of the                            procedure was aided by straightening and shortening                            the scope to obtain bowel loop reduction. The                            patient tolerated the procedure well. The quality                            of the bowel preparation was fair and fair but  repeated irrigation led to a good and adequate                            prep. The ileocecal valve, appendiceal orifice, and                            rectum were photographed. Scope In: 11:08:40 AM Scope Out: 11:19:27 AM Scope Withdrawal Time: 0 hours 8 minutes 24 seconds  Total Procedure Duration: 0 hours 10 minutes 47 seconds  Findings:      The perianal and digital rectal examinations were normal.      Internal hemorrhoids were found during retroflexion. The hemorrhoids       were medium-sized and Grade I (internal hemorrhoids that do not       prolapse).      The exam was otherwise normal throughout the examined colon. Impression:               - Preparation of the colon was fair.                           - Internal hemorrhoids.                           - No specimens collected. Moderate Sedation:      N/A - MAC procedure Recommendation:           - Patient has a contact number available for                             emergencies. The signs and symptoms of potential                            delayed complications were discussed with the                            patient. Return to normal activities tomorrow.                            Written discharge instructions were provided to the                            patient.                           - High fiber diet.                           - Resume Eliquis  (apixaban ) at prior dose in 2 days                            (due to biopsies taken on EGD).                           - Repeat colonoscopy in 5 years for surveillance. Procedure Code(s):        --- Professional ---  54621, Colonoscopy, flexible; diagnostic, including                            collection of specimen(s) by brushing or washing,                            when performed (separate procedure) Diagnosis Code(s):        --- Professional ---                           Z86.010, Personal history of colonic polyps                           K92.1, Melena (includes Hematochezia)                           K64.0, First degree hemorrhoids CPT copyright 2022 American Medical Association. All rights reserved. The codes documented in this report are preliminary and upon coder review may  be revised to meet current compliance requirements. Jerrell JAYSON Sol, MD 07/09/2024 11:31:34 AM This report has been signed electronically. Number of Addenda: 0

## 2024-07-09 NOTE — Interval H&P Note (Signed)
 History and Physical Interval Note:  07/09/2024 10:49 AM  [name]  has presented today for surgery, with the diagnosis of Blood in stool/History of colon polyps/GERD/Nausea.  The various methods of treatment have been discussed with the patient and family. After consideration of risks, benefits and other options for treatment, the patient has consented to  Procedure(s): COLONOSCOPY (N/A) EGD (ESOPHAGOGASTRODUODENOSCOPY) (N/A) as a surgical intervention.  The patient's history has been reviewed, patient examined, no change in status, stable for surgery.  I have reviewed the patient's chart and labs.  Questions were answered to the patient's satisfaction.     Jerrell JAYSON Sol

## 2024-07-09 NOTE — Transfer of Care (Signed)
 Immediate Anesthesia Transfer of Care Note  Patient: [name]  Procedure(s) Performed: COLONOSCOPY EGD (ESOPHAGOGASTRODUODENOSCOPY) BIOPSY, SKIN, SUBCUTANEOUS TISSUE, OR MUCOUS MEMBRANE  Patient Location: PACU and Endoscopy Unit  Anesthesia Type:MAC  Level of Consciousness: drowsy and patient cooperative  Airway & Oxygen  Therapy: Patient Spontanous Breathing and Patient connected to nasal cannula oxygen   Post-op Assessment: Report given to RN and Post -op Vital signs reviewed and stable  Post vital signs: Reviewed and stable  Last Vitals:  Vitals Value Taken Time  BP 161/54 07/09/24 11:26  Temp    Pulse 88 07/09/24 11:30  Resp 17 07/09/24 11:30  SpO2 98 % 07/09/24 11:30  Vitals shown include unfiled device data.  Last Pain:  Vitals:   07/09/24 1126  TempSrc: (P) Temporal  PainSc:          Complications: No notable events documented.

## 2024-07-09 NOTE — Anesthesia Preprocedure Evaluation (Addendum)
 Anesthesia Evaluation  Patient identified by MRN, date of birth, ID band Patient awake    Reviewed: Allergy & Precautions, NPO status , Patient's Chart, lab work & pertinent test results, reviewed documented beta blocker date and time   Airway Mallampati: III  TM Distance: >3 FB Neck ROM: Full    Dental  (+) Missing, Dental Advisory Given, Partial Lower, Edentulous Upper,    Pulmonary shortness of breath and with exertion, sleep apnea and Continuous Positive Airway Pressure Ventilation , PE   Pulmonary exam normal breath sounds clear to auscultation       Cardiovascular hypertension, Pt. on medications and Pt. 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  EKG  Echo   Neuro/Psych  Headaches, Seizures -,  PSYCHIATRIC DISORDERS  Depression    Peripheral neuropathy Glaucoma TIA Neuromuscular disease CVA, No Residual Symptoms    GI/Hepatic Neg liver ROS, PUD,GERD  Medicated,,Heme + stool Hx/o Colon polyps Nausea   Endo/Other  diabetes, Well Controlled, Type 2, Oral Hypoglycemic Agents, Insulin  Dependent  Class 4 obesityHLD  Renal/GU Renal disease  negative genitourinary   Musculoskeletal  (+) Arthritis , Osteoarthritis,    Abdominal  (+) + obese  Peds  Hematology  (+) Blood dyscrasia, anemia Eliquis  therapy   Anesthesia Other Findings   Reproductive/Obstetrics                              Anesthesia Physical Anesthesia Plan  ASA: 3  Anesthesia Plan: MAC   Post-op Pain Management: Minimal or no pain anticipated   Induction: Intravenous  PONV Risk Score and Plan: 2 and Treatment may vary due to age or medical condition and Propofol  infusion  Airway Management Planned: Natural Airway, Nasal Cannula and Simple Face Mask  Additional Equipment:   Intra-op  Plan:   Post-operative Plan:   Informed Consent: I have reviewed the patients History and Physical, chart, labs and discussed the procedure including the risks, benefits and alternatives for the proposed anesthesia with the patient or authorized representative who has indicated his/her understanding and acceptance.     Dental advisory given  Plan Discussed with: CRNA and Anesthesiologist  Anesthesia Plan Comments:          Anesthesia Quick Evaluation

## 2024-07-09 NOTE — Anesthesia Procedure Notes (Signed)
 Procedure Name: General with mask airway Date/Time: 07/09/2024 10:50 AM  Performed by: Laverda Burnard LABOR, CRNAPre-anesthesia Checklist: Patient identified, Emergency Drugs available, Suction available and Patient being monitored Patient Re-evaluated:Patient Re-evaluated prior to induction Oxygen  Delivery Method: Supernova nasal CPAP Preoxygenation: Pre-oxygenation with 100% oxygen  Induction Type: IV induction Airway Equipment and Method: Bite block Placement Confirmation: positive ETCO2 and breath sounds checked- equal and bilateral Dental Injury: Teeth and Oropharynx as per pre-operative assessment

## 2024-07-09 NOTE — Discharge Instructions (Addendum)
 YOU HAD AN ENDOSCOPIC PROCEDURE TODAY: Refer to the procedure report and other information in the discharge instructions given to you for any specific questions about what was found during the examination. If this information does not answer your questions, please call [facility] office at [phone] to clarify.   YOU SHOULD EXPECT: Some feelings of bloating in the abdomen. Passage of more gas than usual. Walking can help get rid of the air that was put into your GI tract during the procedure and reduce the bloating. If you had a lower endoscopy (such as a colonoscopy or flexible sigmoidoscopy) you may notice spotting of blood in your stool or on the toilet paper. Some abdominal soreness may be present for a day or two, also.  DIET: Your first meal following the procedure should be a light meal and then it is ok to progress to your normal diet. A half-sandwich or bowl of soup is an example of a good first meal. Heavy or fried foods are harder to digest and may make you feel nauseous or bloated. Drink plenty of fluids but you should avoid alcoholic beverages for 24 hours. If you had a esophageal dilation, please see attached instructions for diet.    ACTIVITY: Your care partner should take you home directly after the procedure. You should plan to take it easy, moving slowly for the rest of the day. You can resume normal activity the day after the procedure however YOU SHOULD NOT DRIVE, use power tools, machinery or perform tasks that involve climbing or major physical exertion for 24 hours (because of the sedation medicines used during the test).   SYMPTOMS TO REPORT IMMEDIATELY: A gastroenterologist can be reached at any hour. Please call [phone]  for any of the following symptoms:  Following lower endoscopy (colonoscopy, flexible sigmoidoscopy) Excessive amounts of blood in the stool  Significant tenderness, worsening of abdominal pains  Swelling of the abdomen that is new, acute  Fever of 100  or higher  Following upper endoscopy (EGD, EUS, ERCP, esophageal dilation) Vomiting of blood or coffee ground material  New, significant abdominal pain  New, significant chest pain or pain under the shoulder blades  Painful or persistently difficult swallowing  New shortness of breath  Black, tarry-looking or red, bloody stools  FOLLOW UP:  If any biopsies were taken you will be contacted by phone or by letter within the next 1-3 weeks. Call [phone]  if you have not heard about the biopsies in 3 weeks.  Please also call with any specific questions about appointments or follow up tests.   HOLD ELIQUIS  for another 2 days and resume on Thursday 07/11/24.

## 2024-07-09 NOTE — Op Note (Signed)
 [facility] Patient Name: [name] Procedure Date: 07/09/2024 MRN: [id number] Attending MD: Jerrell JAYSON Sol , MD, [id number] Date of Birth: [birth year] CSN: [id number] Age: 63 Admit Type: Ambulatory Procedure:                Upper GI endoscopy Indications:              Esophageal reflux Providers:                Jerrell KYM Sol, MD, Gregoria Pierce, RN,                            Fairy Marina, Technician Referring MD:              Medicines:                Propofol  per Anesthesia, Monitored Anesthesia Care Complications:            No immediate complications. Estimated Blood Loss:     Estimated blood loss was minimal. Procedure:                Pre-Anesthesia Assessment:                           - Prior to the procedure, a History and Physical                            was performed, and patient medications and                            allergies were reviewed. The patient's tolerance of                            previous anesthesia was also reviewed. The risks                            and benefits of the procedure and the sedation                            options and risks were discussed with the patient.                            All questions were answered, and informed consent                            was obtained. Prior Anticoagulants: The patient has                            taken Eliquis  (apixaban ), last dose was 2 days                            prior to procedure. ASA Grade Assessment: III - A                            patient with severe systemic disease. After  reviewing the risks and benefits, the patient was                            deemed in satisfactory condition to undergo the                            procedure.                           After obtaining informed consent, the endoscope was                            passed under direct vision. Throughout the                             procedure, the patient's blood pressure, pulse, and                            oxygen  saturations were monitored continuously. The                            [id number]-[id number] ([id number]) Olympus endoscope was introduced                            through the mouth, and advanced to the second part                            of duodenum. The upper GI endoscopy was                            accomplished without difficulty. The patient                            tolerated the procedure well. Scope In: Scope Out: Findings:      The examined esophagus was normal.      The Z-line was regular and was found 46 cm from the incisors.      Segmental moderate inflammation characterized by congestion (edema) and       erythema was found in the gastric antrum. Biopsies were taken with a       cold forceps for histology. Estimated blood loss was minimal.      The cardia and gastric fundus were normal on retroflexion.      Segmental moderate mucosal changes characterized by congestion,       erythema, flattening and nodularity were found in the duodenal bulb.       Biopsies were taken with a cold forceps for histology. Estimated blood       loss was minimal.      The second portion of the duodenum was normal. Impression:               - Normal esophagus.                           - Z-line regular, 46 cm from the incisors.                           -  Gastritis. Biopsied.                           - Mucosal changes in the duodenum. Biopsied.                           - Normal second portion of the duodenum. Moderate Sedation:      N/A - MAC procedure Recommendation:           - Patient has a contact number available for                            emergencies. The signs and symptoms of potential                            delayed complications were discussed with the                            patient. Return to normal activities tomorrow.                            Written discharge instructions were provided to  the                            patient.                           - Await pathology results.                           - See other report for timing of starting Eliquis . Procedure Code(s):        --- Professional ---                           [phone], Esophagogastroduodenoscopy, flexible,                            transoral; with biopsy, single or multiple Diagnosis Code(s):        --- Professional ---                           K21.9, Gastro-esophageal reflux disease without                            esophagitis                           K29.70, Gastritis, unspecified, without bleeding                           K31.89, Other diseases of stomach and duodenum CPT copyright 2022 [employer]. All rights reserved. The codes documented in this report are preliminary and upon coder review may  be revised to meet current compliance requirements. Jerrell JAYSON Sol, MD 07/09/2024 11:27:43 AM This report has been signed electronically. Number of Addenda: 0

## 2024-07-10 ENCOUNTER — Encounter (HOSPITAL_COMMUNITY): Payer: Self-pay | Admitting: Gastroenterology

## 2024-07-10 LAB — SURGICAL PATHOLOGY

## 2024-07-10 NOTE — Anesthesia Postprocedure Evaluation (Signed)
 Anesthesia Post Note  Patient: [name]  Procedure(s) Performed: COLONOSCOPY EGD (ESOPHAGOGASTRODUODENOSCOPY) BIOPSY, SKIN, SUBCUTANEOUS TISSUE, OR MUCOUS MEMBRANE     Patient location during evaluation: PACU Anesthesia Type: MAC Level of consciousness: awake and alert and oriented Pain management: pain level controlled Vital Signs Assessment: post-procedure vital signs reviewed and stable Respiratory status: spontaneous breathing, nonlabored ventilation and respiratory function stable Cardiovascular status: stable and blood pressure returned to baseline Postop Assessment: no apparent nausea or vomiting Anesthetic complications: no   No notable events documented.  Last Vitals:  Vitals:   07/09/24 1135 07/09/24 1140  BP:  (!) 179/78  Pulse: 93 85  Resp: 11 13  Temp:    SpO2: 95% 96%    Last Pain:  Vitals:   07/09/24 1140  TempSrc:   PainSc: 0-No pain                 Nechuma Boven A.

## 2024-07-16 ENCOUNTER — Encounter (HOSPITAL_BASED_OUTPATIENT_CLINIC_OR_DEPARTMENT_OTHER): Attending: Student | Admitting: Internal Medicine

## 2024-07-16 DIAGNOSIS — E11622 Type 2 diabetes mellitus with other skin ulcer: Secondary | ICD-10-CM | POA: Insufficient documentation

## 2024-07-16 DIAGNOSIS — I87312 Chronic venous hypertension (idiopathic) with ulcer of left lower extremity: Secondary | ICD-10-CM | POA: Insufficient documentation

## 2024-07-16 DIAGNOSIS — I890 Lymphedema, not elsewhere classified: Secondary | ICD-10-CM | POA: Insufficient documentation

## 2024-07-16 DIAGNOSIS — L97822 Non-pressure chronic ulcer of other part of left lower leg with fat layer exposed: Secondary | ICD-10-CM | POA: Diagnosis not present

## 2024-07-23 ENCOUNTER — Encounter (HOSPITAL_BASED_OUTPATIENT_CLINIC_OR_DEPARTMENT_OTHER): Admitting: Internal Medicine

## 2024-07-24 ENCOUNTER — Encounter (HOSPITAL_BASED_OUTPATIENT_CLINIC_OR_DEPARTMENT_OTHER): Attending: Internal Medicine | Admitting: Internal Medicine

## 2024-07-24 DIAGNOSIS — L97822 Non-pressure chronic ulcer of other part of left lower leg with fat layer exposed: Secondary | ICD-10-CM | POA: Diagnosis present

## 2024-07-24 DIAGNOSIS — I87312 Chronic venous hypertension (idiopathic) with ulcer of left lower extremity: Secondary | ICD-10-CM | POA: Insufficient documentation

## 2024-07-24 DIAGNOSIS — E11622 Type 2 diabetes mellitus with other skin ulcer: Secondary | ICD-10-CM | POA: Diagnosis not present

## 2024-07-24 DIAGNOSIS — I890 Lymphedema, not elsewhere classified: Secondary | ICD-10-CM | POA: Diagnosis not present

## 2024-07-30 NOTE — Unmapped External Note (Signed)
 [id number]   Candida Infection: [name] is a fungal infection in the mouth and throat. Thrush doesn't usually affect healthy adults. It's more common in babies and people with a weak immune system. It's also more likely if you take antibiotics or inhaled steroids for asthma. Juan is normally not contagious.   Understanding fungus in the mouth and throat   Your mouth and throat normally contain millions of tiny organisms. These include bacteria and yeasts. Many of these don't cause any problems. In fact, they may help fight disease.   Yeasts are a type of fungus. A type of yeast called Candida normally lives on the membranes of your mouth and throat. It also lives in the digestive tract and on your skin. Usually, this yeast grows only in small amounts and is harmless. But in some cases, Candida can grow out of control and cause thrush. Juan is related to other kinds of Candida infections that can occur at other parts of the body. Thrush refers to an infection of only the mouth and throat.       What causes thrush?   Juan happens when something lets too much Candida grow inside your mouth and throat. Certain things that change the normal balance of organisms in the mouth can lead to thrush. One example is antibiotic medicine. This medicine may kill some of the normal bacteria in your mouth. Candida can then grow freely. People on antibiotics have an increased risk for thrush.   You have a higher risk for thrush if you:   ? Wear dentures   ? Are getting chemotherapy or radiation therapy   ? Have diabetes   ? Have a transplanted organ   ? Use corticosteroids, including inhaled corticosteroids for lung disease   ? Have a weak immune system, such as from HIV infection or AIDS   ? Are an older adult       Symptoms of thrush   Symptoms of thrush can include:   ? A dry, cottony feeling in your mouth   ? Cracking at the corners of the mouth   ? Loss of taste   ? Pain while eating or swallowing   ? White patches on the tongue and around the sides of the mouth       Diagnosing thrush   Your healthcare provider will ask about your medical history and your symptoms. They will look closely at your mouth and throat. White or red patches will be found and may be scraped with a tongue depressor. A sample may be looked at under a microscope or sent to a lab to test. Most cases are confirmed just by their appearance; testing can sometimes help to confirm thrush.   Candida infections can involve the esophagus in people with AIDs or a severely weakened immune system. This is called esophageal candidiasisYour healthcare provider may suspect esophageal involvement if you have difficulty swallowing or pain with swallowing. A definite diagnosis requires an upper endoscopy. This is a procedure to examine the digestive tract using a tube with a light and a camera. During this procedure, a tissue sample may be taken to test.       Treatment for thrush   Thrush is usually treated with antifungal medicine. For mild cases, the medicine is often applied directly in your mouth and throat. This may be in the form of a ?swish and swallow? medicine or an antifungal lozenge to suck on and dissolve in your mouth.   In more  extensive cases, or if you have a weakened immune system, you may instead be treated with an antifungal pill. This can be a stronger treatment than a ''swish and swallow'' or lozenge antifungal. Also, it is required for treatment if candida is outside of the mouth.   If you are at high risk for thrush, you may need to keep taking oral antifungal medicine. This is to help prevent thrush in the future.       What happens if you don?t get treated for thrush?   If untreated, the Candida may make it difficult to eat or drink. Or it can spread to the esophagus and, rarely, to other parts your body. Thrush specifically refers to oral candida.       Preventing thrush   You may be able to help prevent some cases of thrush. Make sure to: ? Practice good oral hygiene.   ? Clean your dentures regularly as instructed. Make sure they fit you correctly.   ? After using a corticosteroid inhaler, rinse out your mouth with water or mouthwash.   ? Take antibiotics only when clearly needed. Follow your healthcare provider's specific instructions.   ? Get treated for health problems that increase your risk for thrush, such as diabetes or HIV.       When to call the healthcare provider   Call your healthcare provider right away if you have any of these:   ? Cottony feeling in your mouth   ? Loss of taste   ? Pain while eating or swallowing   ? White patches or plaques on your tongue or inside your mouth       Last Reviewed Date: 06/24/2024 00:00:00   ? 2000-2025 [employer], [place]. All rights reserved. This information is not intended as a substitute for professional medical care. Always follow your healthcare professional's instructions.

## 2024-07-30 NOTE — Progress Notes (Signed)
 Subjective   [name] is a 63 year old male here for Follow Up (Flu vaccine - declined/Thrush in mouth, wounds on L foot)  HPI  Last seen by PCP 06/06/2024, record reviewed. Endorses tongue burring sensation for 2 weeks. He is using his inhalers for COPD but has not been rinsing his mouth out. States he will now start. 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 Presents without his oxygen .  Left leg wound is managed by wound care ([facility]), he goes weekly. Has dry dressing to left foot.  Previous lab results that show potassium of 3.1 and elevated triglycerides reviewed with patient in detail.  He is followed by cardiology, nephrology and endocrinology.   Documentation Reviewed by Any User: Tobacco  Allergies  Meds  Problems   Med Hx  Surg Hx  Fam Hx     Current Outpatient Medications  Medication Sig Dispense Refill  . nystatin  (MYCOSTATIN ) 100,000 unit/mL suspension Take 6 mL by mouth 4 (four) times daily for 7 days. 168 mL 0  . apixaban  (ELIQUIS ) 5 mg tab TAKE ONE TABLET BY MOUTH TWICE A DAY 180 Tablet 1  . potassium chloride  20 mEq ER tablet TAKE ONE TABLET BY MOUTH TWICE A DAY 180 Tablet 1  . ondansetron  HCL (ZOFRAN ) 4 mg tablet Take 1 Tablet by mouth every 8 (eight) hours as needed for nausea. 32 Tablet 0  . ergocalciferol  (VITAMIN D -2) 1,250 mcg (50,000 unit) capsule Take 1 Capsule by mouth once a week for 180 days take 1 capsule by oral route every 7 days prescribed by pain management. 12 Capsule 1  . fenofibrate  nanocrystallized 48 mg tab Take 1 Tablet by mouth daily. 90 Tablet 3  . mecobalamin, vitamin B12, 1,000 mcg chew Chew and swallow 1 Tablet by mouth daily for 90 days. 90 Tablet 3  . ferrous sulfate  325 mg (65 mg iron) EC tablet TAKE ONE TABLET BY MOUTH DAILY WITH BREAKFAST 30 Tablet 0  . metOLazone (ZAROXOLYN) 2.5 mg tablet Take 1 Tablet by mouth once daily 30 minutes before first dose of lasix .. 90 Tablet 1  . flash glucose scanning reader  (FREESTYLE LIBRE 2 READER) misc Use as directed with compatible sensors to monitor blood sugar levels.    . atorvastatin  (LIPITOR ) 80 mg tablet Take 1 Tablet by mouth once daily for 180 days. 90 Tablet 1  . blood sugar diagnostic (ACCU-CHEK GUIDE TEST STRIPS) strips 1 Each 3 (three) times daily for 300 days. 300 Each 2  . carvediloL  (COREG ) 6.25 mg tablet Take 1 Tablet by mouth 2 (two) times daily with a meal for 180 days. 180 Tablet 1  . furosemide  (LASIX ) 40 mg tablet Take 2 Tablets by mouth once daily AND 1 Tablet nightly at bedtime. Do all this for 180 days. 270 Tablet 1  . traZODone  (DESYREL ) 50 mg tablet Take 1 Tablet by mouth nightly at bedtime. 90 Tablet 1  . pantoprazole  (PROTONIX ) 40 mg EC tablet Take 1 Tablet by mouth every morning before breakfast 90 Tablet 3  . SSD 1 % cream APPLY TOPICALLY ONCE DAILY FOR SEVEN DAYS 25 g 1  . isosorbide  dinitrate (ISORDIL ) 30 mg tablet TAKE ONE TABLET BY MOUTH TWICE A DAY 180 Tablet 4  . albuterol  HFA 90 mcg/actuation inhaler INHALE TWO PUFFS INTO THE LUNGS EVERY 6 (SIX) HOURS AS NEEDED FOR SHORTNESS OF BREATH OR WHEEZING 18 g 2  . lisinopriL  20 mg tablet     . COMBIVENT  RESPIMAT 20-100 mcg/actuation  inhaler PLEASE SEE ATTACHED FOR DETAILED DIRECTIONS 4 g 2  . nitroglycerin  (NITROSTAT ) 0.4 mg SL tablet Place 1 Tablet under the tongue every 5 (five) minutes as needed for chest pain for up to 90 days place 1 tablet by under your tongue at the first sign of an attack; no more than 3 tabs within a 15 minute period. Call 911 30 Tablet 3  . triamcinolone  (KENALOG ) 0.1 % cream Apply topically 2 (two) times daily 453 g 1  . NFDB - OXYGEN , DME, Inhale 4 L/min into the lungs continuously as needed    . BD ULTRA-FINE SHORT PEN NEEDLE 31 gauge x 5/16 ndle Use as needed for 3 injections daily    . glipiZIDE  (GLUCOTROL ) 10 mg tablet     . ACCU-CHEK SOFTCLIX LANCETS     . blood sugar diagnostic (ACCU-CHEK GUIDE TEST STRIPS) strips     . tiZANidine  (ZANAFLEX ) 4 mg  tablet 1 (ONE) TABLET THREE TIMES DAILY, AS NEEDED FOR MUSCLE SPASMS    . ACCU-CHEK GUIDE TEST STRIPS strips     . FREESTYLE LIBRE 2 SENSOR kit     . gabapentin  (NEURONTIN ) 800 mg tablet Take 800 mg by mouth    . HUMULIN  R U-500, CONC, KWIKPEN 500 unit/mL (3 mL) Inject into the skin    . oxyCODONE  (ROXICODONE ) 15 mg tablet 20 mg    . sertraline  (ZOLOFT ) 50 mg tablet Take 1 Tablet by mouth daily.     Review of Systems  Cardiovascular:  Negative for chest pain and palpitations.     Objective   BP (!) 146/79  Pulse 94  Temp 98.4 F (36.9 C)  Wt (!) 356 lb 4 oz (161.6 kg)  SpO2 90%  BMI 51.12 kg/m  Smoking Status Never  BSA 2.83 m  Physical Exam Vitals and nursing note reviewed.   Constitutional:      Appearance: Normal appearance. Joey has morbid obesity HENT:     Mouth/Throat:     Mouth: No oral lesions.     Comments: Some white film to back of tongue and sides  Cardiovascular:     Rate and Rhythm: Normal rate.     Heart sounds: Normal heart sounds.  Pulmonary:     Effort: Pulmonary effort is normal.     Breath sounds: Normal breath sounds.  Skin:    General: Skin is warm and dry.  Neurological:     General: No focal deficit present.     Mental Status: Gavynn is alert and oriented to person, place, and time.     Assessment and Plan   1. Follow-up exam (Primary) 2. Thrush, oral -     nystatin  (MYCOSTATIN ) 100,000 unit/mL suspension; Take 6 mL by mouth 4 (four) times daily for 7 days., Disp-168 mL, R-0  e-Prescribing  Dispense: 168 mL; Refill: 0 3. Encounter to discuss test results 4. Hypokalemia Overview: Formatting of this note is different from the original. Last Assessment & Plan: Patient has a history of hypokalemia but reports compliance with his potassium replacement. Basic metabolic panel on February 14 revealed a normal potassium at 3.9. Plan: -Continue potassium replacement  Orders: -     BASIC METABOLIC PANEL CALCIUM  TOTAL Routine 5. Elevated  cholesterol with elevated triglycerides -     LIPID PANEL Routine   Regulatory Documentation: The following were addressed in today's visit:   Regulatory documentation not addressed.  Total time spent on the date of the encounter was 40 minutes or greater, which includes time spent  in evaluation, counseling, coordination of care and documentation. More that 50% of this time was spent in counseling and coordination of care.    Return in about 1 month (around 08/30/2024), or if symptoms worsen or fail to improve, for Chronic disease follow up with PCP.

## 2024-07-31 ENCOUNTER — Encounter (HOSPITAL_BASED_OUTPATIENT_CLINIC_OR_DEPARTMENT_OTHER): Admitting: Internal Medicine

## 2024-07-31 DIAGNOSIS — I87312 Chronic venous hypertension (idiopathic) with ulcer of left lower extremity: Secondary | ICD-10-CM | POA: Diagnosis not present

## 2024-07-31 DIAGNOSIS — E11622 Type 2 diabetes mellitus with other skin ulcer: Secondary | ICD-10-CM

## 2024-07-31 DIAGNOSIS — L97822 Non-pressure chronic ulcer of other part of left lower leg with fat layer exposed: Secondary | ICD-10-CM | POA: Diagnosis not present

## 2024-08-01 ENCOUNTER — Emergency Department (HOSPITAL_COMMUNITY)

## 2024-08-01 ENCOUNTER — Inpatient Hospital Stay (HOSPITAL_COMMUNITY)
Admission: EM | Admit: 2024-08-01 | Discharge: 2024-08-07 | DRG: 640 | Disposition: A | Discharge status: Home / Self Care | Attending: Internal Medicine | Admitting: Internal Medicine

## 2024-08-01 ENCOUNTER — Encounter (HOSPITAL_BASED_OUTPATIENT_CLINIC_OR_DEPARTMENT_OTHER): Admitting: Internal Medicine

## 2024-08-01 ENCOUNTER — Encounter (HOSPITAL_COMMUNITY): Payer: Self-pay

## 2024-08-01 ENCOUNTER — Other Ambulatory Visit: Payer: Self-pay

## 2024-08-01 DIAGNOSIS — Z7984 Long term (current) use of oral hypoglycemic drugs: Secondary | ICD-10-CM

## 2024-08-01 DIAGNOSIS — E785 Hyperlipidemia, unspecified: Secondary | ICD-10-CM | POA: Diagnosis present

## 2024-08-01 DIAGNOSIS — J9621 Acute and chronic respiratory failure with hypoxia: Secondary | ICD-10-CM | POA: Diagnosis not present

## 2024-08-01 DIAGNOSIS — I252 Old myocardial infarction: Secondary | ICD-10-CM

## 2024-08-01 DIAGNOSIS — G959 Disease of spinal cord, unspecified: Secondary | ICD-10-CM | POA: Diagnosis present

## 2024-08-01 DIAGNOSIS — Z794 Long term (current) use of insulin: Secondary | ICD-10-CM

## 2024-08-01 DIAGNOSIS — F1120 Opioid dependence, uncomplicated: Secondary | ICD-10-CM | POA: Diagnosis present

## 2024-08-01 DIAGNOSIS — Z8673 Personal history of transient ischemic attack (TIA), and cerebral infarction without residual deficits: Secondary | ICD-10-CM

## 2024-08-01 DIAGNOSIS — E119 Type 2 diabetes mellitus without complications: Secondary | ICD-10-CM

## 2024-08-01 DIAGNOSIS — I5032 Chronic diastolic (congestive) heart failure: Secondary | ICD-10-CM | POA: Diagnosis present

## 2024-08-01 DIAGNOSIS — Z888 Allergy status to other drugs, medicaments and biological substances status: Secondary | ICD-10-CM

## 2024-08-01 DIAGNOSIS — Z6841 Body Mass Index (BMI) 40.0 and over, adult: Secondary | ICD-10-CM

## 2024-08-01 DIAGNOSIS — E1169 Type 2 diabetes mellitus with other specified complication: Secondary | ICD-10-CM

## 2024-08-01 DIAGNOSIS — E873 Alkalosis: Secondary | ICD-10-CM | POA: Diagnosis present

## 2024-08-01 DIAGNOSIS — E876 Hypokalemia: Secondary | ICD-10-CM | POA: Diagnosis not present

## 2024-08-01 DIAGNOSIS — Z86718 Personal history of other venous thrombosis and embolism: Secondary | ICD-10-CM

## 2024-08-01 DIAGNOSIS — E66813 Obesity, class 3: Secondary | ICD-10-CM | POA: Diagnosis present

## 2024-08-01 DIAGNOSIS — I2510 Atherosclerotic heart disease of native coronary artery without angina pectoris: Secondary | ICD-10-CM | POA: Diagnosis present

## 2024-08-01 DIAGNOSIS — Z9981 Dependence on supplemental oxygen: Secondary | ICD-10-CM

## 2024-08-01 DIAGNOSIS — Z8711 Personal history of peptic ulcer disease: Secondary | ICD-10-CM

## 2024-08-01 DIAGNOSIS — Z955 Presence of coronary angioplasty implant and graft: Secondary | ICD-10-CM

## 2024-08-01 DIAGNOSIS — G4733 Obstructive sleep apnea (adult) (pediatric): Secondary | ICD-10-CM | POA: Diagnosis present

## 2024-08-01 DIAGNOSIS — Z8249 Family history of ischemic heart disease and other diseases of the circulatory system: Secondary | ICD-10-CM

## 2024-08-01 DIAGNOSIS — R9431 Abnormal electrocardiogram [ECG] [EKG]: Secondary | ICD-10-CM | POA: Insufficient documentation

## 2024-08-01 DIAGNOSIS — N179 Acute kidney failure, unspecified: Secondary | ICD-10-CM | POA: Diagnosis not present

## 2024-08-01 DIAGNOSIS — I10 Essential (primary) hypertension: Secondary | ICD-10-CM | POA: Diagnosis present

## 2024-08-01 DIAGNOSIS — D649 Anemia, unspecified: Secondary | ICD-10-CM | POA: Diagnosis present

## 2024-08-01 DIAGNOSIS — G8929 Other chronic pain: Secondary | ICD-10-CM | POA: Diagnosis present

## 2024-08-01 DIAGNOSIS — D72829 Elevated white blood cell count, unspecified: Secondary | ICD-10-CM | POA: Diagnosis not present

## 2024-08-01 DIAGNOSIS — Z91018 Allergy to other foods: Secondary | ICD-10-CM

## 2024-08-01 DIAGNOSIS — I110 Hypertensive heart disease with heart failure: Secondary | ICD-10-CM | POA: Diagnosis present

## 2024-08-01 DIAGNOSIS — Z86711 Personal history of pulmonary embolism: Secondary | ICD-10-CM

## 2024-08-01 DIAGNOSIS — K219 Gastro-esophageal reflux disease without esophagitis: Secondary | ICD-10-CM | POA: Diagnosis present

## 2024-08-01 DIAGNOSIS — M5412 Radiculopathy, cervical region: Secondary | ICD-10-CM | POA: Diagnosis present

## 2024-08-01 DIAGNOSIS — T782XXA Anaphylactic shock, unspecified, initial encounter: Secondary | ICD-10-CM | POA: Diagnosis not present

## 2024-08-01 DIAGNOSIS — J9801 Acute bronchospasm: Secondary | ICD-10-CM | POA: Diagnosis not present

## 2024-08-01 DIAGNOSIS — Z7901 Long term (current) use of anticoagulants: Secondary | ICD-10-CM

## 2024-08-01 DIAGNOSIS — E1165 Type 2 diabetes mellitus with hyperglycemia: Secondary | ICD-10-CM | POA: Diagnosis present

## 2024-08-01 DIAGNOSIS — Z833 Family history of diabetes mellitus: Secondary | ICD-10-CM

## 2024-08-01 DIAGNOSIS — E1110 Type 2 diabetes mellitus with ketoacidosis without coma: Secondary | ICD-10-CM | POA: Diagnosis not present

## 2024-08-01 LAB — CBC
HCT: 37.6 % — ABNORMAL LOW (ref 39.0–52.0)
Hemoglobin: 12.9 g/dL — ABNORMAL LOW (ref 13.0–17.0)
MCH: 32.2 pg (ref 26.0–34.0)
MCHC: 34.3 g/dL (ref 30.0–36.0)
MCV: 93.8 fL (ref 80.0–100.0)
Platelets: 213 K/uL (ref 150–400)
RBC: 4.01 MIL/uL — ABNORMAL LOW (ref 4.22–5.81)
RDW: 13.1 % (ref 11.5–15.5)
WBC: 11 K/uL — ABNORMAL HIGH (ref 4.0–10.5)
nRBC: 0 % (ref 0.0–0.2)

## 2024-08-01 LAB — BASIC METABOLIC PANEL WITH GFR
Anion gap: 17 — ABNORMAL HIGH (ref 5–15)
BUN: 18 mg/dL (ref 6–20)
CO2: 36 mmol/L — ABNORMAL HIGH (ref 22–32)
Calcium: 9.4 mg/dL (ref 8.9–10.3)
Chloride: 85 mmol/L — ABNORMAL LOW (ref 98–111)
Creatinine, Ser: 0.93 mg/dL (ref 0.61–1.24)
GFR, Estimated: >60 mL/min (ref >60)
Glucose, Bld: 363 mg/dL — ABNORMAL HIGH (ref 70–99)
Potassium: 2.3 mmol/L — CL (ref 3.5–5.1)
Sodium: 138 mmol/L (ref 135–145)

## 2024-08-01 LAB — BLOOD GAS, VENOUS
Acid-Base Excess: 8.8 mmol/L — ABNORMAL HIGH (ref 0.0–2.0)
Bicarbonate: 34.6 mmol/L — ABNORMAL HIGH (ref 20.0–28.0)
O2 Saturation: 71.8 %
Patient temperature: 37
pCO2, Ven: 51 mmHg (ref 44–60)
pH, Ven: 7.44 — ABNORMAL HIGH (ref 7.25–7.43)
pO2, Ven: 40 mmHg (ref 32–45)

## 2024-08-01 LAB — CBG MONITORING, ED
Glucose-Capillary: 370 mg/dL — ABNORMAL HIGH (ref 70–99)
Glucose-Capillary: 248 mg/dL — ABNORMAL HIGH (ref 70–99)

## 2024-08-01 LAB — MAGNESIUM: Magnesium: 1.4 mg/dL — ABNORMAL LOW (ref 1.7–2.4)

## 2024-08-01 LAB — TROPONIN T, HIGH SENSITIVITY
Troponin T High Sensitivity: 24 ng/L — ABNORMAL HIGH (ref 0–19)
Troponin T High Sensitivity: 17 ng/L (ref 0–19)

## 2024-08-01 LAB — BETA-HYDROXYBUTYRIC ACID: Beta-Hydroxybutyric Acid: 0.14 mmol/L (ref 0.05–0.27)

## 2024-08-01 MED ORDER — ACETAMINOPHEN 325 MG PO TABS
650.0000 mg | ORAL_TABLET | Freq: Four times a day (QID) | ORAL | Status: DC | PRN
  Filled 2024-08-01 (×2): qty 2

## 2024-08-01 MED ORDER — SERTRALINE HCL 100 MG PO TABS
50.0000 mg | ORAL_TABLET | Freq: Every morning | ORAL | Status: DC
  Administered 2024-08-02 – 2024-08-07 (×6): 50 mg via ORAL
  Filled 2024-08-01 (×6): qty 1

## 2024-08-01 MED ORDER — POTASSIUM CHLORIDE 10 MEQ/100ML IV SOLN
10.0000 meq | Freq: Once | INTRAVENOUS | Status: AC
  Administered 2024-08-01: 10 meq via INTRAVENOUS
  Filled 2024-08-01: qty 100

## 2024-08-01 MED ORDER — ACETAMINOPHEN 650 MG RE SUPP: 650.0000 mg | Freq: Four times a day (QID) | RECTAL | Status: DC | PRN

## 2024-08-01 MED ORDER — IPRATROPIUM-ALBUTEROL 20-100 MCG/ACT IN AERS: 1.0000 | INHALATION_SPRAY | Freq: Four times a day (QID) | RESPIRATORY_TRACT | Status: DC

## 2024-08-01 MED ORDER — FENOFIBRATE 54 MG PO TABS
54.0000 mg | ORAL_TABLET | Freq: Every day | ORAL | Status: DC
  Administered 2024-08-01 – 2024-08-06 (×6): 54 mg via ORAL
  Filled 2024-08-01 (×6): qty 1

## 2024-08-01 MED ORDER — ONDANSETRON HCL 4 MG/2ML IJ SOLN: 4.0000 mg | Freq: Four times a day (QID) | INTRAMUSCULAR | Status: DC | PRN

## 2024-08-01 MED ORDER — ONDANSETRON HCL 4 MG PO TABS
4.0000 mg | ORAL_TABLET | Freq: Four times a day (QID) | ORAL | Status: DC | PRN
  Administered 2024-08-02 – 2024-08-06 (×4): 4 mg via ORAL
  Filled 2024-08-01 (×4): qty 1

## 2024-08-01 MED ORDER — MAGNESIUM SULFATE 2 GM/50ML IV SOLN
2.0000 g | Freq: Once | INTRAVENOUS | Status: AC
  Administered 2024-08-01: 2 g via INTRAVENOUS
  Filled 2024-08-01: qty 50

## 2024-08-01 MED ORDER — POTASSIUM CHLORIDE 10 MEQ/100ML IV SOLN
10.0000 meq | INTRAVENOUS | Status: AC
  Administered 2024-08-01 – 2024-08-02 (×4): 10 meq via INTRAVENOUS
  Filled 2024-08-01 (×4): qty 100

## 2024-08-01 MED ORDER — ALBUTEROL SULFATE HFA 108 (90 BASE) MCG/ACT IN AERS
2.0000 | INHALATION_SPRAY | Freq: Four times a day (QID) | RESPIRATORY_TRACT | Status: DC | PRN
Start: 2024-08-01 — End: 2024-08-01

## 2024-08-01 MED ORDER — POTASSIUM CHLORIDE ER 10 MEQ PO TBCR
40.0000 meq | EXTENDED_RELEASE_TABLET | Freq: Once | ORAL | Status: AC
  Administered 2024-08-01: 40 meq via ORAL
  Filled 2024-08-01: qty 4

## 2024-08-01 MED ORDER — OXYCODONE HCL 5 MG PO TABS
20.0000 mg | ORAL_TABLET | Freq: Four times a day (QID) | ORAL | Status: DC | PRN
  Administered 2024-08-01 – 2024-08-03 (×6): 20 mg via ORAL
  Filled 2024-08-01 (×6): qty 4

## 2024-08-01 MED ORDER — APIXABAN 5 MG PO TABS
5.0000 mg | ORAL_TABLET | Freq: Two times a day (BID) | ORAL | Status: DC
  Administered 2024-08-01 – 2024-08-07 (×12): 5 mg via ORAL
  Filled 2024-08-01 (×12): qty 1

## 2024-08-01 MED ORDER — FERROUS SULFATE 325 (65 FE) MG PO TABS
325.0000 mg | ORAL_TABLET | Freq: Every day | ORAL | Status: DC
  Administered 2024-08-02 – 2024-08-07 (×6): 325 mg via ORAL
  Filled 2024-08-01 (×6): qty 1

## 2024-08-01 MED ORDER — ALBUTEROL SULFATE (2.5 MG/3ML) 0.083% IN NEBU
2.5000 mg | INHALATION_SOLUTION | Freq: Four times a day (QID) | RESPIRATORY_TRACT | Status: DC | PRN
  Administered 2024-08-03: 2.5 mg via RESPIRATORY_TRACT
  Filled 2024-08-01: qty 3

## 2024-08-01 MED ORDER — POTASSIUM CHLORIDE ER 10 MEQ PO TBCR
40.0000 meq | EXTENDED_RELEASE_TABLET | Freq: Once | ORAL | Status: AC
Start: 2024-08-01 — End: 2024-08-01
  Administered 2024-08-01: 40 meq via ORAL
  Filled 2024-08-01: qty 4

## 2024-08-01 MED ORDER — TRAZODONE HCL 50 MG PO TABS
50.0000 mg | ORAL_TABLET | Freq: Every day | ORAL | Status: DC
  Administered 2024-08-01 – 2024-08-06 (×4): 50 mg via ORAL
  Filled 2024-08-01 (×4): qty 1

## 2024-08-01 MED ORDER — GABAPENTIN 400 MG PO CAPS
800.0000 mg | ORAL_CAPSULE | Freq: Three times a day (TID) | ORAL | Status: DC
Start: 2024-08-01 — End: 2024-08-04
  Administered 2024-08-01 – 2024-08-04 (×8): 800 mg via ORAL
  Filled 2024-08-01 (×8): qty 2

## 2024-08-01 MED ORDER — ATORVASTATIN CALCIUM 40 MG PO TABS
80.0000 mg | ORAL_TABLET | Freq: Every day | ORAL | Status: DC
  Administered 2024-08-01 – 2024-08-06 (×6): 80 mg via ORAL
  Filled 2024-08-01 (×6): qty 2

## 2024-08-01 MED ORDER — METHOCARBAMOL 500 MG PO TABS: 500.0000 mg | ORAL_TABLET | Freq: Four times a day (QID) | ORAL | Status: DC | PRN

## 2024-08-01 MED ORDER — INSULIN ASPART 100 UNIT/ML IJ SOLN
0.0000 [IU] | INTRAMUSCULAR | Status: DC
  Administered 2024-08-01: 7 [IU] via SUBCUTANEOUS
  Administered 2024-08-02: 11 [IU] via SUBCUTANEOUS
  Administered 2024-08-02: 15 [IU] via SUBCUTANEOUS
  Administered 2024-08-02: 7 [IU] via SUBCUTANEOUS
  Filled 2024-08-01: qty 0.2

## 2024-08-01 MED ORDER — NITROGLYCERIN 0.4 MG SL SUBL
0.4000 mg | SUBLINGUAL_TABLET | SUBLINGUAL | Status: AC | PRN
Start: 2024-08-01 — End: ?

## 2024-08-01 MED ORDER — PANTOPRAZOLE SODIUM 40 MG PO TBEC
40.0000 mg | DELAYED_RELEASE_TABLET | Freq: Every day | ORAL | Status: DC
  Administered 2024-08-02 – 2024-08-07 (×6): 40 mg via ORAL
  Filled 2024-08-01 (×6): qty 1

## 2024-08-01 MED ORDER — GLIPIZIDE 5 MG PO TABS
10.0000 mg | ORAL_TABLET | Freq: Two times a day (BID) | ORAL | Status: DC
Start: 2024-08-02 — End: 2024-08-02
  Administered 2024-08-02: 10 mg via ORAL
  Filled 2024-08-01: qty 2

## 2024-08-01 MED ORDER — DORZOLAMIDE HCL-TIMOLOL MAL 2-0.5 % OP SOLN: 1.0000 [drp] | Freq: Two times a day (BID) | OPHTHALMIC | Status: DC

## 2024-08-01 MED ORDER — IPRATROPIUM-ALBUTEROL 0.5-2.5 (3) MG/3ML IN SOLN
3.0000 mL | Freq: Four times a day (QID) | RESPIRATORY_TRACT | Status: DC
  Administered 2024-08-02 (×5): 3 mL via RESPIRATORY_TRACT
  Filled 2024-08-01 (×6): qty 3

## 2024-08-01 MED ORDER — ISOSORBIDE DINITRATE 20 MG PO TABS
30.0000 mg | ORAL_TABLET | Freq: Two times a day (BID) | ORAL | Status: DC
Start: 2024-08-01 — End: 2024-08-04
  Administered 2024-08-01 – 2024-08-03 (×4): 30 mg via ORAL
  Filled 2024-08-01 (×6): qty 1

## 2024-08-01 NOTE — H&P (Signed)
 History and Physical    [name] FMW:[id number] DOB: 02/06/1961 DOA: 08/01/2024  PCP: Abdul Fine, MD   Patient coming from: Home  Chief complaints: Abnormal labs    HPI: [name] is a 63 y.o. male with PMH of anemia arthritis back pain, CAD with stent to LAD in 2017, chronic diastolic CHF, chronic pain, DVT in 2017 GERD,IBS,  hyperlipidemia, hypertension, T2DM, osa, chronic hypoxic on 4l [place]-morbid obesity BMI 50 history of stroke/TIA, cervical myelopathy history of Bell's palsy, DJD presents to the ED after PCP called him for low potassium on routine labs done  on Tuesday.He also complained of chest pain and shortness of breath which is chronic while in triage but to me he endorsed no chest pain. He has leg wound on LLE and goes to wound care q Wednesday and takes lasix . He has been having nausea x 2 wk and had diarrhea 2 days ago but none currently and has not been eating good. Patient otherwise denies any vomiting, fever, chills, headache, focal weakness, numbness tingling, speech difficulties.   In the ZI:[place] heart rate hypertension respiration 10-14, BP stable, on 4 L [place].  EKG nonischemic  Blood gas pH 7.4 pCO2 51 pO2 49, labs hypokalemia 2.3 bicarb 36 -last  k 2.9 and Hco3 80m cbg 295 anion gap 18 06/23/2024, Glucose 363, magnesium  1.4 anion gap 17 troponin 24 hemoglobin 12.9 He is ordered to get 10 kcl iv , 40 po kcl 2 g mag CXR> cardiomegaly. Admission is being requested for further management  Assessment and plan:  Severe Hypokalemia Hypomagnesemia: Replace potassium aggressively, hold diuretics for now suspect from lasix  and poor po intake/Diarrhea.  Metabolic alkalosis: HCO IN 36-likely contraction alkalosis/diuretic use versus sleep apnea related, although VBG reassuring  History of thromboembolism: Resume his Eliquis   Diabetes mellitus w/ uncontrolled hyperglycemia on U-500: Keep on SSI every 4 hours, check BHOB, has chronically elevated anion  gap, pH normal on VBG-currently no evidence of DKA Consult DM COORDINATOR. Resume glipizide .  HTN: BP well-controlled will hold meds coreg  and lisinopril  today-reassess in am  Normocytic normochromic anemia Monitor  Chronic back pain DJD Cervical radiculopathy/myelopathy: Continue pain management. He walks with walker and cane. Cont home oxy 20 mg prn gabapentin .  History of CVA HLD: Cont statins, eliquis   Chronic diastolic CHF CAD Chest pain atypical, chronic: Troponin 24 to check troponin, EKG nonischemic.  Resume home cardiac meds- IMDUR . HODL COREG  TODAY  Chronic hypoxia OSA: Cpap suffocates him and is and only on [place] 4l cont same   Chronic mild anemia: Stable cont home iron sulfate.  Chronic nausea GERD: CONT PPI, PRN zofran   Chronic leg wounds/ edema: Hold lasix . Cont wound care weekly at [facility]  Morbid Obesity w/ Body mass index is 50.22 kg/m.: Will benefit with PCP follow-up, weight loss,healthy lifestyle  DVT prophylaxis: apixaban  (ELIQUIS ) tablet 5 mg Start: 08/01/24 2200 Code Status:   Code Status: Full Code Family Communication: plan of care discussed with patient at bedside. Patient status is: Remains hospitalized because of severity of illness Level of care: Telemetry   Dispo: The patient is from: home, lives alone            Anticipated disposition: TBD Objective: Vitals last 24 hrs: Vitals:   08/01/24 1531 08/01/24 1531 08/01/24 1651 08/01/24 1734  BP:  139/75  132/71  Pulse: 88   83  Resp: 10   14  Temp:    (!) 97.5 F (36.4 C)  TempSrc:  Oral  SpO2: 99%  100% 100%  Weight:      Height:        Physical Examination: General exam: alert awake, oriented, pleasent, obese HEENT:Oral mucosa moist, Ear/Nose WNL grossly Respiratory system: Bilaterally diminished BS,no use of accessory muscle Cardiovascular system: S1 & S2 +, No JVD. Gastrointestinal system: Abdomen soft,NT,ND, BS+ Nervous System: Alert, awake, moving all  extremities,and following commands. Extremities: extremities warm, leg edema + chronic wound w/ compression dressing/wrap Skin: No rashes,no icterus. MSK: Normal muscle bulk,tone, power   Medications reviewed:  Scheduled Meds:  apixaban   5 mg Oral BID   atorvastatin   80 mg Oral Daily   dorzolamide -timolol   1 drop Both Eyes BID   fenofibrate   54 mg Oral Daily   ferrous sulfate   325 mg Oral Daily   gabapentin   800 mg Oral TID   glipiZIDE   10 mg Oral BID   insulin  aspart  0-20 Units Subcutaneous Q4H   Ipratropium-Albuterol   1 puff Inhalation QID   isosorbide  dinitrate  30 mg Oral BID   pantoprazole   40 mg Oral Daily   potassium chloride   40 mEq Oral Once   [START ON 08/02/2024] sertraline   50 mg Oral q morning   traZODone   50 mg Oral QHS   Continuous Infusions:  magnesium  sulfate bolus IVPB     potassium chloride      Diet: Diet Order             Diet Carb Modified Fluid consistency: Thin; Room service appropriate? Yes  Diet effective now                    Severity of Illness: The appropriate patient status for this patient is OBSERVATION. Observation status is judged to be reasonable and necessary in order to provide the required intensity of service to ensure the patient's safety. The patient's presenting symptoms, physical exam findings, and initial radiographic and laboratory data in the context of their medical condition is felt to place them at decreased risk for further clinical deterioration. Furthermore, it is anticipated that the patient will be medically stable for discharge from the hospital within 2 midnights of admission.   Family Communication: Admission, patients condition and plan of care including tests being ordered have been discussed with the patient  who indicate understanding and agree with the plan and Code Status.  Consults called:  none  Review of Systems: All systems were reviewed and were negative except as mentioned in HPI above. Negative for  fever Negative for chest pain Negative for shortness of breath  Past Medical History:  Diagnosis Date   Anemia    Arthritis    Back pain    CAD (coronary artery disease)    a. s/p DES to LAD in 05/2016   Cervical radiculopathy    Chronic diastolic CHF (congestive heart failure) (HCC)    Chronic pain    Deep vein thrombosis (HCC) 01/06/2016   Formatting of this note might be different from the original.  Formatting of this note might be different from the original. Last Assessment & Plan: Management per primary care. Last Assessment & Plan: Management per primary care.  Formatting of this note might be different from the original. Last Assessment & Plan: Management per primary care. Last Assessment & Plan: Management per primary care.   Depression    DVT (deep venous thrombosis) (HCC)    Dyspnea    GERD (gastroesophageal reflux disease)    Hematemesis    Hepatic steatosis  Hyperlipidemia    Hypertension    IBS (irritable bowel syndrome)    Morbid obesity (HCC)    Myocardial infarction (HCC)    OSA (obstructive sleep apnea)    oxygen  4L/[place] at night and PRN   Pancreatitis    PE (pulmonary thromboembolism) (HCC)    Peripheral neuropathy    PUD (peptic ulcer disease)    Pulmonary embolism (HCC) 07/15/2021   Renal disorder    Stroke [facility])    a. ?details unclear - not seen on imaging when he was admitted in 05/2017 for TIA symptoms which were felt due to cervical radiculopathy.   Thoracic aortic ectasia    a. 4.3cm ectatic ascending thoracic aorta by CT 06/2017.    Type 2 diabetes mellitus (HCC)     Past Surgical History:  Procedure Laterality Date   ANTERIOR CERVICAL CORPECTOMY N/A 03/13/2024   Procedure: ANTERIOR CERVICAL DECOMPRESSION AND FUSION WITH PLATING CERVICAL FOUR-CERVICAL FIVE/CERVICAL FIVE-CERVICAL SIX;  Surgeon: Debby Dorn MATSU, MD;  Location: [facility] OR;  Service: Neurosurgery;  Laterality: N/A;   BIOPSY OF SKIN SUBCUTANEOUS TISSUE AND/OR MUCOUS MEMBRANE  07/09/2024    Procedure: BIOPSY, SKIN, SUBCUTANEOUS TISSUE, OR MUCOUS MEMBRANE;  Surgeon: Dianna Specking, MD;  Location: [facility] ENDOSCOPY;  Service: Gastroenterology;;   CARDIAC CATHETERIZATION N/A 05/31/2016   Procedure: Left Heart Cath and Coronary Angiography;  Surgeon: Peter M Swaziland, MD;  Location: [facility] INVASIVE CV LAB;  Service: Cardiovascular;  Laterality: N/A;   CARDIAC CATHETERIZATION N/A 05/31/2016   Procedure: Intravascular Pressure Wire/FFR Study;  Surgeon: Peter M Swaziland, MD;  Location: [facility] INVASIVE CV LAB;  Service: Cardiovascular;  Laterality: N/A;   CARDIAC CATHETERIZATION N/A 05/31/2016   Procedure: Coronary Stent Intervention;  Surgeon: Peter M Swaziland, MD;  Location: [facility] INVASIVE CV LAB;  Service: Cardiovascular;  Laterality: N/A;   COLONOSCOPY N/A 07/17/2021   Procedure: COLONOSCOPY;  Surgeon: Dianna Specking, MD;  Location: [facility] ENDOSCOPY;  Service: Endoscopy;  Laterality: N/A;   COLONOSCOPY N/A 07/09/2024   Procedure: COLONOSCOPY;  Surgeon: Dianna Specking, MD;  Location: [facility] ENDOSCOPY;  Service: Gastroenterology;  Laterality: N/A;   COLONOSCOPY WITH PROPOFOL  N/A 04/29/2020   Procedure: COLONOSCOPY WITH PROPOFOL ;  Surgeon: Dianna Specking, MD;  Location: [facility] ENDOSCOPY;  Service: Endoscopy;  Laterality: N/A;   ESOPHAGOGASTRODUODENOSCOPY N/A 04/29/2020   Procedure: ESOPHAGOGASTRODUODENOSCOPY (EGD);  Surgeon: Dianna Specking, MD;  Location: [facility] ENDOSCOPY;  Service: Endoscopy;  Laterality: N/A;   ESOPHAGOGASTRODUODENOSCOPY N/A 07/09/2024   Procedure: EGD (ESOPHAGOGASTRODUODENOSCOPY);  Surgeon: Dianna Specking, MD;  Location: [facility];  Service: Gastroenterology;  Laterality: N/A;   ESOPHAGOGASTRODUODENOSCOPY (EGD) WITH PROPOFOL  N/A 07/17/2021   Procedure: ESOPHAGOGASTRODUODENOSCOPY (EGD) WITH PROPOFOL ;  Surgeon: Dianna Specking, MD;  Location: [facility] ENDOSCOPY;  Service: Endoscopy;  Laterality: N/A;   GIVENS CAPSULE STUDY N/A 11/10/2022   Procedure: GIVENS CAPSULE STUDY;  Surgeon: Elicia Claw, MD;   Location: [facility] ENDOSCOPY;  Service: Gastroenterology;  Laterality: N/A;   LEFT HEART CATH AND CORONARY ANGIOGRAPHY N/A 12/08/2017   Procedure: LEFT HEART CATH AND CORONARY ANGIOGRAPHY;  Surgeon: Anner Alm ORN, MD;  Location: [facility] INVASIVE CV LAB;  Service: Cardiovascular;  Laterality: N/A;   LEFT HEART CATHETERIZATION WITH CORONARY ANGIOGRAM N/A 02/03/2014   Procedure: LEFT HEART CATHETERIZATION WITH CORONARY ANGIOGRAM;  Surgeon: Vinie KYM Maxcy, MD;  Location: [facility] CATH LAB;  Service: Cardiovascular;  Laterality: N/A;   left leg stent      POLYPECTOMY  04/29/2020   Procedure: POLYPECTOMY;  Surgeon: Dianna Specking, MD;  Location: [facility] ENDOSCOPY;  Service: Endoscopy;;   POLYPECTOMY  07/17/2021  Procedure: POLYPECTOMY;  Surgeon: Dianna Specking, MD;  Location: [facility] ENDOSCOPY;  Service: Endoscopy;;     reports that he has never smoked. He has been exposed to tobacco smoke. He has never used smokeless tobacco. He reports that he does not currently use drugs after having used the following drugs: Marijuana. He reports that he does not drink alcohol .  Allergies  Allergen Reactions   Coconut Fatty Acid Hives   Coconut Flavor [Flavoring Agent (Non-Screening)] Hives   Ibuprofen Other (See Comments)    Made gastric ulcers worse   Aleve  [Naproxen ] Other (See Comments)    DUE TO KIDNEYS   Nsaids Other (See Comments)    Stomach ulcers    Vascepa  [Icosapent  Ethyl (Epa Ethyl Ester) (Fish)] Nausea And Vomiting and Other (See Comments)    headaches, chest pain, similar to sx of a stroke, hypotension    Other Hives and Rash    Nut Allergy    Family History  Problem Relation Age of Onset   Cancer Father    Hypertension Mother    Diabetes Mother    Breast cancer Mother    Hypertension Brother    Diabetes Brother    Hypertension Sister    Diabetes Sister      Prior to Admission medications   Medication Sig Start Date End Date Taking? Authorizing Provider  albuterol  (VENTOLIN  HFA) 108 (90 Base)  MCG/ACT inhaler Inhale 2 puffs into the lungs every 6 (six) hours as needed for wheezing or shortness of breath.    [provider]  atorvastatin  (LIPITOR ) 80 MG tablet Take 80 mg by mouth daily. 07/04/23   [provider]  B Complex-C (B-COMPLEX WITH VITAMIN C) tablet Take 1 tablet by mouth daily. 11/16/22   Tobie Yetta HERO, MD  carvedilol  (COREG ) 6.25 MG tablet Take 6.25 mg by mouth 2 (two) times daily.    [provider]  COMBIVENT  RESPIMAT 20-100 MCG/ACT AERS respimat Inhale 1 puff into the lungs 4 (four) times daily.    [provider]  docusate sodium  (COLACE) 100 MG capsule Take 1 capsule (100 mg total) by mouth 2 (two) times daily. 03/16/24   Tomlinson, Sara Caylin, PA-C  dorzolamide -timolol  (COSOPT ) 2-0.5 % ophthalmic solution Place 1 drop into both eyes 2 (two) times daily.    [provider]  ELIQUIS  5 MG TABS tablet TAKE 1 TABLET BY MOUTH TWICE A DAY 10/03/22   Shadad, Firas N, MD  fenofibrate  (TRICOR ) 145 MG tablet TAKE 1 TABLET (145 MG TOTAL) BY MOUTH DAILY. 05/19/22   Emelia Josefa HERO, NP  ferrous sulfate  325 (65 FE) MG tablet Take 1 tablet (325 mg total) by mouth daily. 11/16/22 05/16/24  Tobie Yetta HERO, MD  furosemide  (LASIX ) 40 MG tablet Take 40-80 mg by mouth See admin instructions. Take 80 mg by mouth in the morning and 40 mg at night    [provider]  gabapentin  (NEURONTIN ) 800 MG tablet Take 800 mg by mouth 3 (three) times daily.    [provider]  glipiZIDE  (GLUCOTROL ) 10 MG tablet Take 10 mg by mouth 2 (two) times daily. 05/30/17   [provider]  HUMULIN  R U-500 KWIKPEN 500 UNIT/ML kwikpen Inject 95-120 Units into the skin in the morning, at noon, and at bedtime. Per sliding scale 3 times daily with meals 06/04/20   [provider]  Insulin  Pen Needle 31G X 5 MM MISC Use 1 needle daily to inject insulin  as prescribed 06/18/17   Ricky Fines, MD  isosorbide   dinitrate (ISORDIL ) 30 MG tablet Take 30 mg  by mouth 2 (two) times daily. 09/01/23   [provider]  lisinopril  (ZESTRIL ) 20 MG tablet Take 20 mg by mouth daily. 05/02/23   [provider]  Menthol , Topical Analgesic, (BIOFREEZE EX) Apply 1 application  topically as needed (knee pain).    [provider]  methocarbamol  (ROBAXIN ) 500 MG tablet Take 1 tablet (500 mg total) by mouth every 6 (six) hours as needed for muscle spasms. 03/16/24   Johnanna Credit Caylin, PA-C  nitroGLYCERIN  (NITROSTAT ) 0.4 MG SL tablet Place 1 tablet (0.4 mg total) under the tongue every 5 (five) minutes as needed for chest pain. 02/06/19   Raford Riggs, MD  ondansetron  (ZOFRAN ) 4 MG tablet Take 4 mg by mouth as needed for nausea/vomiting, vomiting or nausea. 04/21/20   [provider]  oxyCODONE  (OXY IR/ROXICODONE ) 5 MG immediate release tablet Take 1 tablet (5 mg total) by mouth every 4 (four) hours as needed for moderate pain (pain score 4-6). 03/16/24   Johnanna Credit Caylin, PA-C  Oxycodone  HCl 20 MG TABS Take 20 mg by mouth every 6 (six) hours as needed (pain).    [provider]  pantoprazole  (PROTONIX ) 40 MG tablet Take 40 mg by mouth daily. 05/26/22   [provider]  potassium chloride  (MICRO-K ) 10 MEQ CR capsule Take 2 capsules (20 mEq total) by mouth 2 (two) times daily. 07/21/22   Pietro Redell RAMAN, MD  potassium chloride  SA (KLOR-CON  M) 20 MEQ tablet Take 20 mEq by mouth 2 (two) times daily. 02/27/24   [provider]  sertraline  (ZOLOFT ) 50 MG tablet Take 50 mg by mouth every morning. 12/17/20   [provider]  SSD 1 % cream Apply 1 Application topically daily as needed (open wounds). 02/13/24   [provider]  traZODone  (DESYREL ) 50 MG tablet Take 1 tablet (50 mg total) by mouth at bedtime. 12/15/16   Burns, Alexa R, MD  triamcinolone  cream (KENALOG ) 0.1 % Apply 1 Application topically daily as needed (itching).    [provider]  Vitamin D , Ergocalciferol , (DRISDOL)  1.25 MG (50000 UT) CAPS capsule Take 50,000 Units by mouth every Monday. 08/15/19   [provider]   r   Labs on Admission: I have personally reviewed following labs and imaging studies  CBC: Recent Labs  Lab 08/01/24 1532  WBC 11.0*  HGB 12.9*  HCT 37.6*  MCV 93.8  PLT 213   Basic Metabolic Panel: Recent Labs  Lab 08/01/24 1532  NA 138  K 2.3*  CL 85*  CO2 36*  GLUCOSE 363*  BUN 18  CREATININE 0.93  CALCIUM  9.4  MG 1.4*   Estimated Creatinine Clearance: 128.2 mL/min (by C-G formula based on SCr of 0.93 mg/dL). No results for input(s): AST, ALT, ALKPHOS, BILITOT, PROT, ALBUMIN  in the last 168 hours. No results for input(s): LIPASE, AMYLASE in the last 168 hours. No results for input(s): AMMONIA in the last 168 hours. Coagulation Profile: No results for input(s): INR, PROTIME in the last 168 hours. Cardiac Panel (last 3 results) No results for input(s): CKTOTAL, CKMB, TROPONINIHS, RELINDX in the last 72 hours.  BNP (last 3 results) No results for input(s): PROBNP in the last 8760 hours. HbA1C: No results for input(s): HGBA1C in the last 72 hours. CBG: Recent Labs  Lab 08/01/24 1517  GLUCAP 370*   Lipid Profile: No results for input(s): CHOL, HDL, LDLCALC, TRIG, CHOLHDL, LDLDIRECT in the last 72 hours. Thyroid  Function Tests:  No results for input(s): TSH, T4TOTAL, FREET4, T3FREE, THYROIDAB in the last 72 hours. Urine analysis:    Component Value Date/Time   COLORURINE STRAW (A) 12/09/2023 1522   APPEARANCEUR CLEAR 12/09/2023 1522   LABSPEC 1.009 12/09/2023 1522   PHURINE 5.0 12/09/2023 1522   GLUCOSEU NEGATIVE 12/09/2023 1522   HGBUR NEGATIVE 12/09/2023 1522   BILIRUBINUR NEGATIVE 12/09/2023 1522   KETONESUR NEGATIVE 12/09/2023 1522   PROTEINUR NEGATIVE 12/09/2023 1522   UROBILINOGEN 0.2 05/27/2014 2312   NITRITE NEGATIVE 12/09/2023 1522   LEUKOCYTESUR NEGATIVE 12/09/2023 1522     Radiological Exams on Admission: DG Chest 2 View Result Date: 08/01/2024 CLINICAL DATA:  Chest pain and shortness of breath. EXAM: CHEST - 2 VIEW COMPARISON:  06/23/2024 FINDINGS: Stable cardiomegaly. Similar central vascular congestion. No focal airspace disease. No pleural effusion or pneumothorax. No acute osseous abnormality. IMPRESSION: Stable cardiomegaly and central vascular congestion. Electronically Signed   By: Andrea Gasman M.D.   On: 08/01/2024 16:41    Mennie LAMY MD [facility]  If 7PM-7AM, please contact night-coverage [email or web address]  08/01/2024, 8:09 PM

## 2024-08-01 NOTE — ED Notes (Signed)
 Writer will start potassium once pt is done with Magnesium .

## 2024-08-01 NOTE — ED Triage Notes (Signed)
 BIB EMS from home for chest pain, sob. Pt states he has these at baseline, but it is worse than usual since this AM. CP is in middle, non radiating. 4L [place] at baseline. Potassium was low on blood work from Tuesday

## 2024-08-01 NOTE — Hospital Course (Addendum)
 63 year old man PMH including CAD, chronic diastolic CHF, chronic hypoxic respiratory failure on 4 L, he was sent to the emergency department by PCP for hypokalemia.  Admitted for severe hypokalemia, hypomagnesemia, electrolytes repleted but developed sudden stridor and hypotension consistent with anaphylaxis requiring transfer to the ICU.  Cause of anaphylaxis unclear, no apparent new medications or foods.  Subsequently developed DKA.  Consultants Critical care  Procedures/Events 10/9 admission 10/11 sudden stridor and anaphylaxis  10/12 development of AKI, DKA 10/13 DKA resolved, renal function improving, no recurrent anaphylaxis

## 2024-08-01 NOTE — Telephone Encounter (Signed)
 Called patient and made him aware of low potassium and that provider recommended that he go to the ER.   Patient verbalized understanding.  He's reluctant to go, but after explaining why it was important for him to go, he confirmed that he would go as advised.

## 2024-08-01 NOTE — ED Provider Notes (Signed)
 [facility] AT [facility] Provider Note  CSN: [id number] Arrival date & time: 08/01/24 1511  Chief Complaint(s) Chest Pain  HPI [name] is a 63 y.o. male who is here today for chest pain and shortness of breath.  Patient has history of heart failure, states that he has some chronic pain in his chest at baseline, is on 4 L chronically.  He was called by his PCP because his potassium was low.  Patient also with a history of hypomagnesia.  Patient takes Eliquis .   Past Medical History Past Medical History:  Diagnosis Date   Anemia    Arthritis    Back pain    CAD (coronary artery disease)    a. s/p DES to LAD in 05/2016   Cervical radiculopathy    Chronic diastolic CHF (congestive heart failure) (HCC)    Chronic pain    Deep vein thrombosis (HCC) 01/06/2016   Formatting of this note might be different from the original.  Formatting of this note might be different from the original. Last Assessment & Plan: Management per primary care. Last Assessment & Plan: Management per primary care.  Formatting of this note might be different from the original. Last Assessment & Plan: Management per primary care. Last Assessment & Plan: Management per primary care.   Depression    DVT (deep venous thrombosis) (HCC)    Dyspnea    GERD (gastroesophageal reflux disease)    Hematemesis    Hepatic steatosis    Hyperlipidemia    Hypertension    IBS (irritable bowel syndrome)    Morbid obesity (HCC)    Myocardial infarction (HCC)    OSA (obstructive sleep apnea)    oxygen  4L/[place] at night and PRN   Pancreatitis    PE (pulmonary thromboembolism) (HCC)    Peripheral neuropathy    PUD (peptic ulcer disease)    Pulmonary embolism (HCC) 07/15/2021   Renal disorder    Stroke [facility])    a. ?details unclear - not seen on imaging when he was admitted in 05/2017 for TIA symptoms which were felt due to cervical radiculopathy.   Thoracic aortic ectasia    a. 4.3cm  ectatic ascending thoracic aorta by CT 06/2017.    Type 2 diabetes mellitus [facility])    Patient Active Problem List   Diagnosis Date Noted   Blood in the stool 07/09/2024   Cervical myelopathy (HCC) 03/13/2024   Bronchitis 07/06/2023   Chronic respiratory failure with hypoxia (HCC) 07/06/2023   Pulmonary embolism (HCC) 07/06/2023   AKI (acute kidney injury) 05/14/2023   Paresthesias 11/22/2022   Hypotension 11/21/2022   Acute kidney injury superimposed on chronic kidney disease 11/21/2022   GI bleeding 11/10/2022   History of pulmonary embolism 11/10/2022   Right-sided Bell's palsy 10/11/2022   Class 3 obesity (HCC) 08/16/2022   Diplopia 08/15/2022   Carpal tunnel syndrome on both sides 03/14/2022   Diabetic ulcer of lower leg (HCC) 06/26/2021   Lumbar radiculitis 03/30/2021   Morbid obesity (HCC) 01/15/2021   History of colonic polyps 01/15/2021   Pain due to onychomycosis of toenails of both feet 01/15/2021   Epistaxis, recurrent 10/21/2020   Laceration of nose 10/21/2020   OSA (obstructive sleep apnea)    Bilateral carotid artery stenosis 07/28/2020   Bilateral lower extremity edema 07/28/2020   CHF (congestive heart failure) (HCC) 07/28/2020   DDD (degenerative disc disease), lumbar 07/28/2020   Diabetic peripheral neuropathy (HCC) 07/28/2020   Fatty liver 07/28/2020  Irritable bowel syndrome with diarrhea 07/28/2020   Osteoarthritis of right hip 07/28/2020   Mixed diabetic hyperlipidemia associated with type 2 diabetes mellitus (HCC) 04/27/2020   Rotator cuff arthropathy 10/30/2019   Cervical radiculopathy 09/24/2019   Trochanteric bursitis of right hip 09/24/2019   Chronic diastolic CHF (congestive heart failure) (HCC) 08/23/2019   ARF (acute renal failure) 08/01/2019   Hyperkalemia 08/01/2019   Uncontrolled type 2 diabetes mellitus with hyperglycemia (HCC) 08/01/2019   Trochanteric bursitis 02/11/2019   Long-term insulin  use (HCC) 01/25/2019   Neck pain 10/09/2018    Left arm weakness 10/09/2018   Decreased sensation of lower extremity 09/12/2018   B12 deficiency 08/10/2017   Persistent headaches 08/09/2017   GERD (gastroesophageal reflux disease) 06/29/2017   Left arm numbness    History of CVA (cerebrovascular accident) 06/17/2017   TIA (transient ischemic attack) 06/17/2017   Degenerative joint disease involving multiple joints 04/09/2017   Foraminal stenosis of lumbar region 01/19/2017   Chronic back pain 12/29/2016   Depression 12/15/2016   Vitamin D  deficiency 12/09/2016   Right hip pain 12/07/2016   Hypokalemia 12/07/2016   Type 2 diabetes mellitus with vascular disease (HCC) 05/31/2016   Normocytic normochromic anemia 05/31/2016   Chest pain 05/31/2016   Coronary artery disease involving native coronary artery of native heart without angina pectoris 01/06/2016   Chronic pancreatitis (HCC) 03/21/2014   Edema 03/21/2014   Chest pain, non-cardiac 01/29/2014   DM2 (diabetes mellitus, type 2) (HCC) 01/29/2014   Obesity, Class III, BMI 40-49.9 (morbid obesity) (HCC) 01/29/2014   Snoring 01/29/2014   Dyslipidemia 01/29/2014   HTN (hypertension) 01/29/2014   Abnormal nuclear stress test 01/29/2014   Benign essential hypertension 08/15/2013   Home Medication(s) Prior to Admission medications   Medication Sig Start Date End Date Taking? Authorizing Provider  albuterol  (VENTOLIN  HFA) 108 (90 Base) MCG/ACT inhaler Inhale 2 puffs into the lungs every 6 (six) hours as needed for wheezing or shortness of breath.    [provider]  atorvastatin  (LIPITOR ) 80 MG tablet Take 80 mg by mouth daily. 07/04/23   [provider]  B Complex-C (B-COMPLEX WITH VITAMIN C) tablet Take 1 tablet by mouth daily. 11/16/22   Tobie Yetta HERO, MD  carvedilol  (COREG ) 6.25 MG tablet Take 6.25 mg by mouth 2 (two) times daily.    [provider]  COMBIVENT  RESPIMAT 20-100 MCG/ACT AERS respimat Inhale 1 puff into the lungs 4 (four) times daily.     [provider]  docusate sodium  (COLACE) 100 MG capsule Take 1 capsule (100 mg total) by mouth 2 (two) times daily. 03/16/24   Tomlinson, Sara Caylin, PA-C  dorzolamide -timolol  (COSOPT ) 2-0.5 % ophthalmic solution Place 1 drop into both eyes 2 (two) times daily.    [provider]  ELIQUIS  5 MG TABS tablet TAKE 1 TABLET BY MOUTH TWICE A DAY 10/03/22   Shadad, Firas N, MD  fenofibrate  (TRICOR ) 145 MG tablet TAKE 1 TABLET (145 MG TOTAL) BY MOUTH DAILY. 05/19/22   Emelia Josefa HERO, NP  ferrous sulfate  325 (65 FE) MG tablet Take 1 tablet (325 mg total) by mouth daily. 11/16/22 05/16/24  Tobie Yetta HERO, MD  furosemide  (LASIX ) 40 MG tablet Take 40-80 mg by mouth See admin instructions. Take 80 mg by mouth in the morning and 40 mg at night    [provider]  gabapentin  (NEURONTIN ) 800 MG tablet Take 800 mg by mouth 3 (three) times daily.    [provider]  glipiZIDE  (GLUCOTROL ) 10  MG tablet Take 10 mg by mouth 2 (two) times daily. 05/30/17   [provider]  HUMULIN  R U-500 KWIKPEN 500 UNIT/ML kwikpen Inject 95-120 Units into the skin in the morning, at noon, and at bedtime. Per sliding scale 3 times daily with meals 06/04/20   [provider]  Insulin  Pen Needle 31G X 5 MM MISC Use 1 needle daily to inject insulin  as prescribed 06/18/17   Ricky Fines, MD  isosorbide  dinitrate (ISORDIL ) 30 MG tablet Take 30 mg by mouth 2 (two) times daily. 09/01/23   [provider]  lisinopril  (ZESTRIL ) 20 MG tablet Take 20 mg by mouth daily. 05/02/23   [provider]  Menthol , Topical Analgesic, (BIOFREEZE EX) Apply 1 application  topically as needed (knee pain).    [provider]  methocarbamol  (ROBAXIN ) 500 MG tablet Take 1 tablet (500 mg total) by mouth every 6 (six) hours as needed for muscle spasms. 03/16/24   Tomlinson, Sara Caylin, PA-C  nitroGLYCERIN  (NITROSTAT ) 0.4 MG SL tablet Place 1 tablet (0.4 mg total) under the tongue every 5  (five) minutes as needed for chest pain. 02/06/19   Raford Riggs, MD  ondansetron  (ZOFRAN ) 4 MG tablet Take 4 mg by mouth as needed for nausea/vomiting, vomiting or nausea. 04/21/20   [provider]  oxyCODONE  (OXY IR/ROXICODONE ) 5 MG immediate release tablet Take 1 tablet (5 mg total) by mouth every 4 (four) hours as needed for moderate pain (pain score 4-6). 03/16/24   Tomlinson, Sara Caylin, PA-C  Oxycodone  HCl 20 MG TABS Take 20 mg by mouth every 6 (six) hours as needed (pain).    [provider]  pantoprazole  (PROTONIX ) 40 MG tablet Take 40 mg by mouth daily. 05/26/22   [provider]  potassium chloride  (MICRO-K ) 10 MEQ CR capsule Take 2 capsules (20 mEq total) by mouth 2 (two) times daily. 07/21/22   Pietro Redell RAMAN, MD  potassium chloride  SA (KLOR-CON  M) 20 MEQ tablet Take 20 mEq by mouth 2 (two) times daily. 02/27/24   [provider]  sertraline  (ZOLOFT ) 50 MG tablet Take 50 mg by mouth every morning. 12/17/20   [provider]  SSD 1 % cream Apply 1 Application topically daily as needed (open wounds). 02/13/24   [provider]  traZODone  (DESYREL ) 50 MG tablet Take 1 tablet (50 mg total) by mouth at bedtime. 12/15/16   Burns, Alexa R, MD  triamcinolone  cream (KENALOG ) 0.1 % Apply 1 Application topically daily as needed (itching).    [provider]  Vitamin D , Ergocalciferol , (DRISDOL) 1.25 MG (50000 UT) CAPS capsule Take 50,000 Units by mouth every Monday. 08/15/19   [provider]                                                                                                                                    Past Surgical History Past Surgical History:  Procedure Laterality Date  ANTERIOR CERVICAL CORPECTOMY N/A 03/13/2024   Procedure: ANTERIOR CERVICAL DECOMPRESSION AND FUSION WITH PLATING CERVICAL FOUR-CERVICAL FIVE/CERVICAL FIVE-CERVICAL SIX;  Surgeon: Debby Dorn MATSU, MD;  Location: [facility] OR;  Service:  Neurosurgery;  Laterality: N/A;   BIOPSY OF SKIN SUBCUTANEOUS TISSUE AND/OR MUCOUS MEMBRANE  07/09/2024   Procedure: BIOPSY, SKIN, SUBCUTANEOUS TISSUE, OR MUCOUS MEMBRANE;  Surgeon: Dianna Specking, MD;  Location: [facility] ENDOSCOPY;  Service: Gastroenterology;;   CARDIAC CATHETERIZATION N/A 05/31/2016   Procedure: Left Heart Cath and Coronary Angiography;  Surgeon: Peter M Swaziland, MD;  Location: [facility] INVASIVE CV LAB;  Service: Cardiovascular;  Laterality: N/A;   CARDIAC CATHETERIZATION N/A 05/31/2016   Procedure: Intravascular Pressure Wire/FFR Study;  Surgeon: Peter M Swaziland, MD;  Location: [facility] INVASIVE CV LAB;  Service: Cardiovascular;  Laterality: N/A;   CARDIAC CATHETERIZATION N/A 05/31/2016   Procedure: Coronary Stent Intervention;  Surgeon: Peter M Swaziland, MD;  Location: [facility] INVASIVE CV LAB;  Service: Cardiovascular;  Laterality: N/A;   COLONOSCOPY N/A 07/17/2021   Procedure: COLONOSCOPY;  Surgeon: Dianna Specking, MD;  Location: [facility] ENDOSCOPY;  Service: Endoscopy;  Laterality: N/A;   COLONOSCOPY N/A 07/09/2024   Procedure: COLONOSCOPY;  Surgeon: Dianna Specking, MD;  Location: [facility] ENDOSCOPY;  Service: Gastroenterology;  Laterality: N/A;   COLONOSCOPY WITH PROPOFOL  N/A 04/29/2020   Procedure: COLONOSCOPY WITH PROPOFOL ;  Surgeon: Dianna Specking, MD;  Location: [facility] ENDOSCOPY;  Service: Endoscopy;  Laterality: N/A;   ESOPHAGOGASTRODUODENOSCOPY N/A 04/29/2020   Procedure: ESOPHAGOGASTRODUODENOSCOPY (EGD);  Surgeon: Dianna Specking, MD;  Location: [facility] ENDOSCOPY;  Service: Endoscopy;  Laterality: N/A;   ESOPHAGOGASTRODUODENOSCOPY N/A 07/09/2024   Procedure: EGD (ESOPHAGOGASTRODUODENOSCOPY);  Surgeon: Dianna Specking, MD;  Location: [facility];  Service: Gastroenterology;  Laterality: N/A;   ESOPHAGOGASTRODUODENOSCOPY (EGD) WITH PROPOFOL  N/A 07/17/2021   Procedure: ESOPHAGOGASTRODUODENOSCOPY (EGD) WITH PROPOFOL ;  Surgeon: Dianna Specking, MD;  Location: [facility] ENDOSCOPY;  Service: Endoscopy;  Laterality: N/A;    GIVENS CAPSULE STUDY N/A 11/10/2022   Procedure: GIVENS CAPSULE STUDY;  Surgeon: Elicia Claw, MD;  Location: [facility] ENDOSCOPY;  Service: Gastroenterology;  Laterality: N/A;   LEFT HEART CATH AND CORONARY ANGIOGRAPHY N/A 12/08/2017   Procedure: LEFT HEART CATH AND CORONARY ANGIOGRAPHY;  Surgeon: Anner Alm ORN, MD;  Location: [facility] INVASIVE CV LAB;  Service: Cardiovascular;  Laterality: N/A;   LEFT HEART CATHETERIZATION WITH CORONARY ANGIOGRAM N/A 02/03/2014   Procedure: LEFT HEART CATHETERIZATION WITH CORONARY ANGIOGRAM;  Surgeon: Vinie KYM Maxcy, MD;  Location: [facility] CATH LAB;  Service: Cardiovascular;  Laterality: N/A;   left leg stent      POLYPECTOMY  04/29/2020   Procedure: POLYPECTOMY;  Surgeon: Dianna Specking, MD;  Location: [facility] ENDOSCOPY;  Service: Endoscopy;;   POLYPECTOMY  07/17/2021   Procedure: POLYPECTOMY;  Surgeon: Dianna Specking, MD;  Location: [facility] ENDOSCOPY;  Service: Endoscopy;;   Family History Family History  Problem Relation Age of Onset   Cancer Father    Hypertension Mother    Diabetes Mother    Breast cancer Mother    Hypertension Brother    Diabetes Brother    Hypertension Sister    Diabetes Sister     Social History Social History   Tobacco Use   Smoking status: Never    Passive exposure: Past   Smokeless tobacco: Never  Vaping Use   Vaping status: Never Used  Substance Use Topics   Alcohol  use: No   Drug use: Not Currently    Types: Marijuana   Allergies Coconut fatty acid, Coconut flavor [flavoring agent (non-screening)], Ibuprofen, Aleve  [naproxen ], Nsaids, Vascepa  [icosapent  ethyl (  epa ethyl ester) (fish)], and Other  Review of Systems Review of Systems  Physical Exam Vital Signs  I have reviewed the triage vital signs BP 132/71 (BP Location: Right Arm)   Pulse 83   Temp (!) 97.5 F (36.4 C) (Oral)   Resp 14   Ht 5' 10 (1.778 m)   Wt (!) 158.8 kg   SpO2 100%   BMI 50.22 kg/m   Physical Exam Vitals and nursing note reviewed.   Cardiovascular:     Rate and Rhythm: Normal rate.     Heart sounds: Normal heart sounds.  Pulmonary:     Breath sounds: Normal breath sounds.  Abdominal:     Palpations: Abdomen is soft.  Skin:    General: Skin is warm.  Neurological:     General: No focal deficit present.     Mental Status: He is alert.     ED Results and Treatments Labs (all labs ordered are listed, but only abnormal results are displayed) Labs Reviewed  BASIC METABOLIC PANEL WITH GFR - Abnormal; Notable for the following components:      Result Value   Potassium 2.3 (*)    Chloride 85 (*)    CO2 36 (*)    Glucose, Bld 363 (*)    Anion gap 17 (*)    All other components within normal limits  CBC - Abnormal; Notable for the following components:   WBC 11.0 (*)    RBC 4.01 (*)    Hemoglobin 12.9 (*)    HCT 37.6 (*)    All other components within normal limits  MAGNESIUM  - Abnormal; Notable for the following components:   Magnesium  1.4 (*)    All other components within normal limits  BLOOD GAS, VENOUS - Abnormal; Notable for the following components:   pH, Ven 7.44 (*)    Bicarbonate 34.6 (*)    Acid-Base Excess 8.8 (*)    All other components within normal limits  CBG MONITORING, ED - Abnormal; Notable for the following components:   Glucose-Capillary 370 (*)    All other components within normal limits  TROPONIN T, HIGH SENSITIVITY - Abnormal; Notable for the following components:   Troponin T High Sensitivity 24 (*)    All other components within normal limits  TROPONIN T, HIGH SENSITIVITY                                                                                                                          Radiology DG Chest 2 View Result Date: 08/01/2024 CLINICAL DATA:  Chest pain and shortness of breath. EXAM: CHEST - 2 VIEW COMPARISON:  06/23/2024 FINDINGS: Stable cardiomegaly. Similar central vascular congestion. No focal airspace disease. No pleural effusion or pneumothorax. No acute  osseous abnormality. IMPRESSION: Stable cardiomegaly and central vascular congestion. Electronically Signed   By: Andrea Gasman M.D.   On: 08/01/2024 16:41    Pertinent labs & imaging results that were available during my care of the patient  were reviewed by me and considered in my medical decision making (see MDM for details).  Medications Ordered in ED Medications  potassium chloride  10 mEq in 100 mL IVPB (10 mEq Intravenous New Bag/Given 08/01/24 1822)  magnesium  sulfate IVPB 2 g 50 mL (0 g Intravenous Stopped 08/01/24 1822)  potassium chloride  (KLOR-CON ) CR tablet 40 mEq (40 mEq Oral Given 08/01/24 1821)                                                                                                                                     Procedures .Critical Care  Performed by: Mannie Fairy DASEN, DO Authorized by: Mannie Fairy DASEN, DO   Critical care provider statement:    Critical care time (minutes):  35   Critical care was necessary to treat or prevent imminent or life-threatening deterioration of the following conditions:  Metabolic crisis   Critical care was time spent personally by me on the following activities:  Development of treatment plan with patient or surrogate, discussions with consultants, evaluation of patient's response to treatment, examination of patient, ordering and review of laboratory studies, ordering and review of radiographic studies, ordering and performing treatments and interventions, pulse oximetry, re-evaluation of patient's condition and review of old charts   (including critical care time)  Medical Decision Making / ED Course   This patient presents to the ED for concern of shortness of breath, chest pain and hypokalemia, this involves an extensive number of treatment options, and is a complaint that carries with it a high risk of complications and morbidity.  The differential diagnosis includes hypokalemia, hypomagnesia, ACS, chronic chest pain, heart  failure, less likely PE.  MDM: Patient's initial troponin mildly elevated at 24.  EKG is at baseline.  His magnesium  and potassium are both decreased.  Likely component of diuretic use, patient chronically on furosemide .  Will begin to replete patient's electrolytes.  Will trend troponin.  Patient likely require admission due to his significant electrolyte abnormalities and baseline poor health.  Reassessment 6:45 PM-have begin replating the patient's electrolytes.  Second troponin hemolyzed.  Will admit patient to hospitalist pending repeat troponin.  Patient not having any chest pain at this time.   Additional history obtained: -Additional history obtained from EMS -External records from outside source obtained and reviewed including: Chart review including previous notes, labs, imaging, consultation notes   Lab Tests: -I ordered, reviewed, and interpreted labs.   The pertinent results include:   Labs Reviewed  BASIC METABOLIC PANEL WITH GFR - Abnormal; Notable for the following components:      Result Value   Potassium 2.3 (*)    Chloride 85 (*)    CO2 36 (*)    Glucose, Bld 363 (*)    Anion gap 17 (*)    All other components within normal limits  CBC - Abnormal; Notable for the following components:   WBC 11.0 (*)    RBC  4.01 (*)    Hemoglobin 12.9 (*)    HCT 37.6 (*)    All other components within normal limits  MAGNESIUM  - Abnormal; Notable for the following components:   Magnesium  1.4 (*)    All other components within normal limits  BLOOD GAS, VENOUS - Abnormal; Notable for the following components:   pH, Ven 7.44 (*)    Bicarbonate 34.6 (*)    Acid-Base Excess 8.8 (*)    All other components within normal limits  CBG MONITORING, ED - Abnormal; Notable for the following components:   Glucose-Capillary 370 (*)    All other components within normal limits  TROPONIN T, HIGH SENSITIVITY - Abnormal; Notable for the following components:   Troponin T High Sensitivity 24  (*)    All other components within normal limits  TROPONIN T, HIGH SENSITIVITY      EKG sinus rhythm, no ST segment elevations.  Prolonged QT.  EKG Interpretation Date/Time:  Thursday August 01 2024 15:22:49 EDT Ventricular Rate:  87 PR Interval:  179 QRS Duration:  100 QT Interval:  478 QTC Calculation: 576 R Axis:   37  Text Interpretation: Sinus rhythm Prolonged QT interval Confirmed by Franklyn Gills [phone]) on 08/01/2024 4:36:36 PM         Imaging Studies ordered: I ordered imaging studies including chest x-ray I independently visualized and interpreted imaging. I agree with the radiologist interpretation   Medicines ordered and prescription drug management: Meds ordered this encounter  Medications   magnesium  sulfate IVPB 2 g 50 mL   potassium chloride  10 mEq in 100 mL IVPB   potassium chloride  (KLOR-CON ) CR tablet 40 mEq    -I have reviewed the patients home medicines and have made adjustments as needed  Critical interventions Management of hypokalemia, hypomagnesia  Cardiac Monitoring: The patient was maintained on a cardiac monitor.  I personally viewed and interpreted the cardiac monitored which showed an underlying rhythm of: Normal sinus rhythm  Social Determinants of Health:  Factors impacting patients care include: Multiple medical comorbidities including CHF requiring oxygen    Reevaluation: After the interventions noted above, I reevaluated the patient and found that they have :improved  Co morbidities that complicate the patient evaluation  Past Medical History:  Diagnosis Date   Anemia    Arthritis    Back pain    CAD (coronary artery disease)    a. s/p DES to LAD in 05/2016   Cervical radiculopathy    Chronic diastolic CHF (congestive heart failure) (HCC)    Chronic pain    Deep vein thrombosis (HCC) 01/06/2016   Formatting of this note might be different from the original.  Formatting of this note might be different from the original.  Last Assessment & Plan: Management per primary care. Last Assessment & Plan: Management per primary care.  Formatting of this note might be different from the original. Last Assessment & Plan: Management per primary care. Last Assessment & Plan: Management per primary care.   Depression    DVT (deep venous thrombosis) (HCC)    Dyspnea    GERD (gastroesophageal reflux disease)    Hematemesis    Hepatic steatosis    Hyperlipidemia    Hypertension    IBS (irritable bowel syndrome)    Morbid obesity (HCC)    Myocardial infarction (HCC)    OSA (obstructive sleep apnea)    oxygen  4L/[place] at night and PRN   Pancreatitis    PE (pulmonary thromboembolism) (HCC)    Peripheral neuropathy  PUD (peptic ulcer disease)    Pulmonary embolism (HCC) 07/15/2021   Renal disorder    Stroke [facility])    a. ?details unclear - not seen on imaging when he was admitted in 05/2017 for TIA symptoms which were felt due to cervical radiculopathy.   Thoracic aortic ectasia    a. 4.3cm ectatic ascending thoracic aorta by CT 06/2017.    Type 2 diabetes mellitus (HCC)          Final Clinical Impression(s) / ED Diagnoses Final diagnoses:  Hypokalemia  Hypomagnesemia     @PCDICTATION @    Mannie Pac T, DO 08/01/24 1847

## 2024-08-01 NOTE — Telephone Encounter (Signed)
 Patient reported he is currently in the ambulance being transported to the hospital ED.

## 2024-08-02 DIAGNOSIS — D649 Anemia, unspecified: Secondary | ICD-10-CM | POA: Diagnosis present

## 2024-08-02 DIAGNOSIS — E876 Hypokalemia: Secondary | ICD-10-CM | POA: Diagnosis present

## 2024-08-02 DIAGNOSIS — Z86718 Personal history of other venous thrombosis and embolism: Secondary | ICD-10-CM | POA: Diagnosis not present

## 2024-08-02 DIAGNOSIS — I5032 Chronic diastolic (congestive) heart failure: Secondary | ICD-10-CM | POA: Diagnosis present

## 2024-08-02 DIAGNOSIS — N179 Acute kidney failure, unspecified: Secondary | ICD-10-CM | POA: Diagnosis not present

## 2024-08-02 DIAGNOSIS — E1110 Type 2 diabetes mellitus with ketoacidosis without coma: Secondary | ICD-10-CM | POA: Diagnosis not present

## 2024-08-02 DIAGNOSIS — Z6841 Body Mass Index (BMI) 40.0 and over, adult: Secondary | ICD-10-CM | POA: Diagnosis not present

## 2024-08-02 DIAGNOSIS — J9621 Acute and chronic respiratory failure with hypoxia: Secondary | ICD-10-CM | POA: Diagnosis not present

## 2024-08-02 DIAGNOSIS — Z794 Long term (current) use of insulin: Secondary | ICD-10-CM

## 2024-08-02 DIAGNOSIS — G959 Disease of spinal cord, unspecified: Secondary | ICD-10-CM | POA: Diagnosis present

## 2024-08-02 DIAGNOSIS — E1122 Type 2 diabetes mellitus with diabetic chronic kidney disease: Secondary | ICD-10-CM | POA: Diagnosis not present

## 2024-08-02 DIAGNOSIS — E873 Alkalosis: Secondary | ICD-10-CM | POA: Diagnosis present

## 2024-08-02 DIAGNOSIS — F1120 Opioid dependence, uncomplicated: Secondary | ICD-10-CM | POA: Diagnosis present

## 2024-08-02 DIAGNOSIS — K219 Gastro-esophageal reflux disease without esophagitis: Secondary | ICD-10-CM | POA: Diagnosis present

## 2024-08-02 DIAGNOSIS — E66813 Obesity, class 3: Secondary | ICD-10-CM | POA: Diagnosis present

## 2024-08-02 DIAGNOSIS — E1165 Type 2 diabetes mellitus with hyperglycemia: Secondary | ICD-10-CM | POA: Diagnosis not present

## 2024-08-02 DIAGNOSIS — R9431 Abnormal electrocardiogram [ECG] [EKG]: Secondary | ICD-10-CM | POA: Insufficient documentation

## 2024-08-02 DIAGNOSIS — G4733 Obstructive sleep apnea (adult) (pediatric): Secondary | ICD-10-CM | POA: Diagnosis present

## 2024-08-02 DIAGNOSIS — Z7901 Long term (current) use of anticoagulants: Secondary | ICD-10-CM | POA: Diagnosis not present

## 2024-08-02 DIAGNOSIS — I2510 Atherosclerotic heart disease of native coronary artery without angina pectoris: Secondary | ICD-10-CM | POA: Diagnosis present

## 2024-08-02 DIAGNOSIS — I110 Hypertensive heart disease with heart failure: Secondary | ICD-10-CM | POA: Diagnosis present

## 2024-08-02 DIAGNOSIS — N1832 Chronic kidney disease, stage 3b: Secondary | ICD-10-CM

## 2024-08-02 DIAGNOSIS — Z7984 Long term (current) use of oral hypoglycemic drugs: Secondary | ICD-10-CM | POA: Diagnosis not present

## 2024-08-02 DIAGNOSIS — Z8249 Family history of ischemic heart disease and other diseases of the circulatory system: Secondary | ICD-10-CM | POA: Diagnosis not present

## 2024-08-02 DIAGNOSIS — T782XXA Anaphylactic shock, unspecified, initial encounter: Secondary | ICD-10-CM | POA: Diagnosis not present

## 2024-08-02 DIAGNOSIS — E785 Hyperlipidemia, unspecified: Secondary | ICD-10-CM | POA: Diagnosis present

## 2024-08-02 DIAGNOSIS — I252 Old myocardial infarction: Secondary | ICD-10-CM | POA: Diagnosis not present

## 2024-08-02 LAB — BASIC METABOLIC PANEL WITH GFR
Anion gap: 17 — ABNORMAL HIGH (ref 5–15)
BUN: 18 mg/dL (ref 6–20)
CO2: 35 mmol/L — ABNORMAL HIGH (ref 22–32)
Calcium: 9.2 mg/dL (ref 8.9–10.3)
Chloride: 85 mmol/L — ABNORMAL LOW (ref 98–111)
Creatinine, Ser: 1.01 mg/dL (ref 0.61–1.24)
GFR, Estimated: >60 mL/min (ref >60)
Glucose, Bld: 331 mg/dL — ABNORMAL HIGH (ref 70–99)
Potassium: 2.7 mmol/L — CL (ref 3.5–5.1)
Sodium: 137 mmol/L (ref 135–145)

## 2024-08-02 LAB — CBC
HCT: 39.6 % (ref 39.0–52.0)
Hemoglobin: 12.8 g/dL — ABNORMAL LOW (ref 13.0–17.0)
MCH: 30.8 pg (ref 26.0–34.0)
MCHC: 32.3 g/dL (ref 30.0–36.0)
MCV: 95.4 fL (ref 80.0–100.0)
Platelets: 223 K/uL (ref 150–400)
RBC: 4.15 MIL/uL — ABNORMAL LOW (ref 4.22–5.81)
RDW: 13.2 % (ref 11.5–15.5)
WBC: 10.7 K/uL — ABNORMAL HIGH (ref 4.0–10.5)
nRBC: 0 % (ref 0.0–0.2)

## 2024-08-02 LAB — GLUCOSE, CAPILLARY
Glucose-Capillary: 350 mg/dL — ABNORMAL HIGH (ref 70–99)
Glucose-Capillary: 255 mg/dL — ABNORMAL HIGH (ref 70–99)
Glucose-Capillary: 223 mg/dL — ABNORMAL HIGH (ref 70–99)
Glucose-Capillary: 322 mg/dL — ABNORMAL HIGH (ref 70–99)
Glucose-Capillary: 383 mg/dL — ABNORMAL HIGH (ref 70–99)
Glucose-Capillary: 403 mg/dL — ABNORMAL HIGH (ref 70–99)

## 2024-08-02 LAB — MAGNESIUM: Magnesium: 2 mg/dL (ref 1.7–2.4)

## 2024-08-02 LAB — POTASSIUM: Potassium: 3 mmol/L — ABNORMAL LOW (ref 3.5–5.1)

## 2024-08-02 LAB — PHOSPHORUS: Phosphorus: 3.4 mg/dL (ref 2.5–4.6)

## 2024-08-02 MED ORDER — INSULIN GLARGINE 100 UNIT/ML ~~LOC~~ SOLN: 10.0000 [IU] | Freq: Every day | SUBCUTANEOUS | Status: DC

## 2024-08-02 MED ORDER — POTASSIUM CHLORIDE ER 10 MEQ PO TBCR
40.0000 meq | EXTENDED_RELEASE_TABLET | Freq: Four times a day (QID) | ORAL | Status: AC
  Administered 2024-08-02 (×2): 40 meq via ORAL
  Filled 2024-08-02 (×4): qty 4

## 2024-08-02 MED ORDER — POTASSIUM CHLORIDE ER 10 MEQ PO TBCR
40.0000 meq | EXTENDED_RELEASE_TABLET | Freq: Four times a day (QID) | ORAL | Status: AC
  Administered 2024-08-02 (×2): 40 meq via ORAL
  Filled 2024-08-02 (×2): qty 4

## 2024-08-02 MED ORDER — INSULIN GLARGINE 100 UNIT/ML ~~LOC~~ SOLN
5.0000 [IU] | Freq: Every day | SUBCUTANEOUS | Status: DC
  Administered 2024-08-02: 5 [IU] via SUBCUTANEOUS
  Filled 2024-08-02: qty 0.05

## 2024-08-02 MED ORDER — FUROSEMIDE 40 MG PO TABS
40.0000 mg | ORAL_TABLET | Freq: Every day | ORAL | Status: DC
  Administered 2024-08-02 – 2024-08-03 (×2): 40 mg via ORAL
  Filled 2024-08-02 (×2): qty 1

## 2024-08-02 MED ORDER — INSULIN ASPART 100 UNIT/ML IJ SOLN
0.0000 [IU] | Freq: Three times a day (TID) | INTRAMUSCULAR | Status: DC
  Administered 2024-08-02: 20 [IU] via SUBCUTANEOUS
  Administered 2024-08-02 – 2024-08-03 (×2): 15 [IU] via SUBCUTANEOUS
  Administered 2024-08-03 (×2): 20 [IU] via SUBCUTANEOUS

## 2024-08-02 MED ORDER — FUROSEMIDE 40 MG PO TABS
80.0000 mg | ORAL_TABLET | Freq: Every day | ORAL | Status: DC
  Administered 2024-08-03: 80 mg via ORAL
  Filled 2024-08-02 (×2): qty 2

## 2024-08-02 MED ORDER — CARVEDILOL 6.25 MG PO TABS
6.2500 mg | ORAL_TABLET | Freq: Two times a day (BID) | ORAL | Status: DC
  Administered 2024-08-03 (×2): 6.25 mg via ORAL
  Filled 2024-08-02 (×3): qty 1

## 2024-08-02 MED ORDER — INSULIN ASPART 100 UNIT/ML IJ SOLN
5.0000 [IU] | Freq: Three times a day (TID) | INTRAMUSCULAR | Status: DC
  Administered 2024-08-02 – 2024-08-03 (×4): 5 [IU] via SUBCUTANEOUS

## 2024-08-02 MED ORDER — INSULIN ASPART 100 UNIT/ML IJ SOLN
10.0000 [IU] | Freq: Once | INTRAMUSCULAR | Status: AC
  Administered 2024-08-02: 10 [IU] via SUBCUTANEOUS

## 2024-08-02 MED ORDER — INSULIN GLARGINE 100 UNIT/ML ~~LOC~~ SOLN
25.0000 [IU] | Freq: Every day | SUBCUTANEOUS | Status: DC
  Administered 2024-08-03: 25 [IU] via SUBCUTANEOUS
  Filled 2024-08-02 (×2): qty 0.25

## 2024-08-02 MED ORDER — LISINOPRIL 20 MG PO TABS
20.0000 mg | ORAL_TABLET | Freq: Every day | ORAL | Status: DC
  Administered 2024-08-03: 20 mg via ORAL
  Filled 2024-08-02: qty 1

## 2024-08-02 MED ORDER — FUROSEMIDE 40 MG PO TABS: 40.0000 mg | ORAL_TABLET | ORAL | Status: DC

## 2024-08-02 NOTE — Consult Note (Signed)
 WOC Nurse Consult Note: patient is followed at wound care center last seen 08/01/2024, note reviewed including photo documentation  Reason for Consult: left lower leg wound  Wound type: full thickness r/t trauma per [facility] note in setting of lymphedema  Pressure Injury POA: not pressure  Measurement: 6 x 5.6 cm x 0.1 cm  Wound azi:jeezjmd red moist  Drainage (amount, consistency, odor)  Periwound: chronic changes of lymphedema  Dressing procedure/placement/frequency: Patient follows with wound care center every Wednesday with debridement, cleansing with Vashe, applying silver and compression dressing.  Patient due to return to wound care center Wednesday 10/15 and requires no wound care until seen by them.    POC discussed with patient and bedside nurse. WOC team will not follow. Re-consult if further needs arise.   Thank you,    Powell Bar MSN, RN-BC, [employer]

## 2024-08-02 NOTE — Progress Notes (Signed)
  Progress Note   Patient: [name] FMW:[id number] DOB: 1961-03-01 DOA: 08/01/2024     0 DOS: the patient was seen and examined on 08/02/2024   Brief hospital course: 63 year old man PMH including CAD, chronic diastolic CHF, chronic hypoxic respiratory failure on 4 L, he was sent to the emergency department by PCP for hypokalemia.  Admitted for severe hypokalemia, hypomagnesemia  Consultants None   Procedures/Events 10/9 admission  Assessment and Plan: Severe hypokalemia Hypomagnesemia Contraction alkalosis Related to recent diarrhea, Lasix  Replete and follow BMP and magnesium  levels  Chronic diastolic CHF Chronic atypical chest pain Troponins minimally elevated, trivial, EKG nonischemic Continue Imdur , resuming carvedilol   Chronic hypoxic respiratory failure OSA Continue 4 L nasal cannula as at home, does not use CPAP  Diabetes mellitus type 2 Lantus  as inpatient, sliding scale insulin , meal coverage  Essential hypertension Resume carvedilol  and lisinopril   Chronic back pain Cervical radiculopathy, myelopathy Walks with walker and cane Continue gabapentin   Chronic leg wound Can follow-up at wound care clinic  Prolonged QTc Replete electrolytes and check EKG in a.m..    Subjective:  Feels ok  Physical Exam: Vitals:   08/02/24 0911 08/02/24 1158 08/02/24 1347 08/02/24 1609  BP: 138/62  (!) 143/64   Pulse: 87  89   Resp: 16  16   Temp: 98 F (36.7 C)  98.8 F (37.1 C)   TempSrc:   Oral   SpO2: 97% 97% 99% 96%  Weight:      Height:       Physical Exam Vitals reviewed.  Constitutional:      General: He is not in acute distress.    Appearance: He is not ill-appearing or toxic-appearing.  Cardiovascular:     Rate and Rhythm: Normal rate and regular rhythm.     Heart sounds: No murmur heard. Pulmonary:     Effort: Pulmonary effort is normal. No respiratory distress.     Breath sounds: No wheezing, rhonchi or rales.  Neurological:      Mental Status: He is alert.  Psychiatric:        Mood and Affect: Mood normal.        Behavior: Behavior normal.     Data Reviewed: CBG 200-300s Potassium up to 3.0 Magnesium  within normal limits Troponin 24, 17 CBC noted  Family Communication: none  Disposition: Status is: Inpatient Remains inpatient appropriate because: hypokalemia     Time spent: 35 minutes  Author: Toribio Door, MD 08/02/2024 6:46 PM  For on call review [email or web address].

## 2024-08-02 NOTE — Inpatient Diabetes Management (Signed)
 Inpatient Diabetes Program Recommendations  AACE/ADA: New Consensus Statement on Inpatient Glycemic Control (2015)  Target Ranges:  Prepandial:   less than 140 mg/dL      Peak postprandial:   less than 180 mg/dL (1-2 hours)      Critically ill patients:  140 - 180 mg/dL   Lab Results  Component Value Date   GLUCAP 322 (H) 08/02/2024   HGBA1C 9.1 (H) 03/04/2024    Review of Glycemic Control  Latest Reference Range & Units 08/01/24 15:17 08/01/24 20:32 08/02/24 01:49 08/02/24 04:31 08/02/24 07:22 08/02/24 11:24  Glucose-Capillary 70 - 99 mg/dL 629 (H) 751 (H) 649 (H) 255 (H) 223 (H) 322 (H)  (H): Data is abnormally high  Diabetes history: DM2 Outpatient Diabetes medications: U-500 95-120 units TID, Glipizide  10 mg BID, Freestyle Libre 2 Current orders for Inpatient glycemic control: Lantus  5 units every day, Novolog  0-20 units TID  Inpatient Diabetes Program Recommendations:    Patient takes U-500 95-120 units TID.    Please consider:  Lantus  25 units every day Novolog  0-20 units TID and HS Novolog  5 units TID with meals if he consumes at least 50%   Met with patient at bedside to confirm home medication as listed above.  Thank you, Wyvonna Pinal, MSN, CDC[facility] Diabetes Coordinator Inpatient Diabetes Program [phone] (team pager from 8a-5p)

## 2024-08-03 DIAGNOSIS — E876 Hypokalemia: Secondary | ICD-10-CM | POA: Diagnosis not present

## 2024-08-03 DIAGNOSIS — E1122 Type 2 diabetes mellitus with diabetic chronic kidney disease: Secondary | ICD-10-CM | POA: Diagnosis not present

## 2024-08-03 DIAGNOSIS — E1165 Type 2 diabetes mellitus with hyperglycemia: Secondary | ICD-10-CM

## 2024-08-03 DIAGNOSIS — T782XXA Anaphylactic shock, unspecified, initial encounter: Secondary | ICD-10-CM | POA: Diagnosis not present

## 2024-08-03 LAB — BASIC METABOLIC PANEL WITH GFR
Anion gap: 15 (ref 5–15)
Anion gap: 16 — ABNORMAL HIGH (ref 5–15)
BUN: 15 mg/dL (ref 6–20)
BUN: 22 mg/dL — ABNORMAL HIGH (ref 6–20)
CO2: 36 mmol/L — ABNORMAL HIGH (ref 22–32)
CO2: 31 mmol/L (ref 22–32)
Calcium: 9.8 mg/dL (ref 8.9–10.3)
Calcium: 9.6 mg/dL (ref 8.9–10.3)
Chloride: 85 mmol/L — ABNORMAL LOW (ref 98–111)
Chloride: 87 mmol/L — ABNORMAL LOW (ref 98–111)
Creatinine, Ser: 0.84 mg/dL (ref 0.61–1.24)
Creatinine, Ser: 1.6 mg/dL — ABNORMAL HIGH (ref 0.61–1.24)
GFR, Estimated: >60 mL/min (ref >60)
GFR, Estimated: 49 mL/min — ABNORMAL LOW (ref >60)
Glucose, Bld: 356 mg/dL — ABNORMAL HIGH (ref 70–99)
Glucose, Bld: 433 mg/dL — ABNORMAL HIGH (ref 70–99)
Potassium: 2.9 mmol/L — ABNORMAL LOW (ref 3.5–5.1)
Potassium: 3.7 mmol/L (ref 3.5–5.1)
Sodium: 136 mmol/L (ref 135–145)
Sodium: 134 mmol/L — ABNORMAL LOW (ref 135–145)

## 2024-08-03 LAB — GLUCOSE, CAPILLARY
Glucose-Capillary: 370 mg/dL — ABNORMAL HIGH (ref 70–99)
Glucose-Capillary: 378 mg/dL — ABNORMAL HIGH (ref 70–99)
Glucose-Capillary: 399 mg/dL — ABNORMAL HIGH (ref 70–99)
Glucose-Capillary: 427 mg/dL — ABNORMAL HIGH (ref 70–99)
Glucose-Capillary: 324 mg/dL — ABNORMAL HIGH (ref 70–99)
Glucose-Capillary: 504 mg/dL (ref 70–99)

## 2024-08-03 LAB — MAGNESIUM
Magnesium: 1.9 mg/dL (ref 1.7–2.4)
Magnesium: 2.2 mg/dL (ref 1.7–2.4)

## 2024-08-03 LAB — MRSA NEXT GEN BY PCR, NASAL: MRSA by PCR Next Gen: NOT DETECTED

## 2024-08-03 LAB — HIV ANTIBODY (ROUTINE TESTING W REFLEX): HIV Screen 4th Generation wRfx: NONREACTIVE

## 2024-08-03 MED ORDER — POTASSIUM CHLORIDE ER 10 MEQ PO TBCR
40.0000 meq | EXTENDED_RELEASE_TABLET | Freq: Four times a day (QID) | ORAL | Status: AC
  Administered 2024-08-03 (×2): 40 meq via ORAL
  Filled 2024-08-03 (×4): qty 4

## 2024-08-03 MED ORDER — CHLORHEXIDINE GLUCONATE CLOTH 2 % EX PADS
6.0000 | MEDICATED_PAD | Freq: Every day | CUTANEOUS | Status: DC
  Administered 2024-08-03 – 2024-08-07 (×5): 6 via TOPICAL

## 2024-08-03 MED ORDER — IPRATROPIUM-ALBUTEROL 0.5-2.5 (3) MG/3ML IN SOLN
3.0000 mL | Freq: Three times a day (TID) | RESPIRATORY_TRACT | Status: DC
  Administered 2024-08-03 – 2024-08-06 (×7): 3 mL via RESPIRATORY_TRACT
  Filled 2024-08-03 (×9): qty 3

## 2024-08-03 MED ORDER — METHYLPREDNISOLONE SODIUM SUCC 125 MG IJ SOLR
INTRAMUSCULAR | Status: AC
  Filled 2024-08-03: qty 2

## 2024-08-03 MED ORDER — METHYLPREDNISOLONE SODIUM SUCC 125 MG IJ SOLR
125.0000 mg | Freq: Once | INTRAMUSCULAR | Status: AC
  Administered 2024-08-03: 125 mg via INTRAVENOUS

## 2024-08-03 MED ORDER — MAGNESIUM SULFATE 2 GM/50ML IV SOLN
2.0000 g | Freq: Once | INTRAVENOUS | Status: AC
  Administered 2024-08-03: 2 g via INTRAVENOUS
  Filled 2024-08-03: qty 50

## 2024-08-03 MED ORDER — DIPHENHYDRAMINE HCL 50 MG/ML IJ SOLN
50.0000 mg | Freq: Once | INTRAMUSCULAR | Status: AC
  Administered 2024-08-03: 50 mg via INTRAVENOUS
  Filled 2024-08-03: qty 1

## 2024-08-03 MED ORDER — LACTATED RINGERS IV BOLUS
1000.0000 mL | Freq: Once | INTRAVENOUS | Status: AC
  Administered 2024-08-03: 1000 mL via INTRAVENOUS

## 2024-08-03 MED ORDER — FAMOTIDINE IN NACL 20-0.9 MG/50ML-% IV SOLN
20.0000 mg | Freq: Once | INTRAVENOUS | Status: AC
  Administered 2024-08-03: 20 mg via INTRAVENOUS
  Filled 2024-08-03: qty 50

## 2024-08-03 MED ORDER — LACTATED RINGERS IV BOLUS
500.0000 mL | Freq: Once | INTRAVENOUS | Status: AC
  Administered 2024-08-03: 500 mL via INTRAVENOUS

## 2024-08-03 MED ORDER — RACEPINEPHRINE HCL 2.25 % IN NEBU
0.5000 mL | INHALATION_SOLUTION | Freq: Once | RESPIRATORY_TRACT | Status: AC
  Administered 2024-08-03: 0.5 mL via RESPIRATORY_TRACT
  Filled 2024-08-03: qty 15

## 2024-08-03 MED ORDER — EPINEPHRINE 1 MG/10ML IV SOSY
PREFILLED_SYRINGE | INTRAVENOUS | Status: AC
  Administered 2024-08-03: 1 mg
  Filled 2024-08-03: qty 10

## 2024-08-03 MED ORDER — FUROSEMIDE 10 MG/ML IJ SOLN
80.0000 mg | Freq: Once | INTRAMUSCULAR | Status: DC
Start: 2024-08-03 — End: 2024-08-03

## 2024-08-03 MED ORDER — INSULIN ASPART 100 UNIT/ML IJ SOLN
20.0000 [IU] | Freq: Once | INTRAMUSCULAR | Status: AC
  Administered 2024-08-03: 20 [IU] via SUBCUTANEOUS

## 2024-08-03 NOTE — Consult Note (Signed)
 Name: [name] MRN: [id number] DOB: 10/11/1961    ADMISSION DATE:  08/01/2024  HISTORY OF PRESENT ILLNESS:   63 years old with past medical history of CAD, chronic diastolic heart failure, chronic hypoxic respiratory failure on 4 L oxygen , who was admitted for hypokalemia and CHF. I was called by the hospitalist due to sudden stridor, bronchospasm and hypotension, concerning for anaphylaxis.  During my encounter patient had stridor, no able to breathe, MAP around 60.  I was not able to identify the cause of the possible anaphylaxis, but he was treated with Solu-Medrol  125, epinephrine  1 mg, Benadryl  50 mg, and Pepcid  with quickly resolution of his symptoms.  He denied to have new medications, he is not aware of being allergic to any food.   PAST MEDICAL HISTORY :   has a past medical history of Anemia, Arthritis, Back pain, CAD (coronary artery disease), Cervical radiculopathy, Chronic diastolic CHF (congestive heart failure) (HCC), Chronic pain, Deep vein thrombosis (HCC) (01/06/2016), Depression, DVT (deep venous thrombosis) (HCC), Dyspnea, GERD (gastroesophageal reflux disease), Hematemesis, Hepatic steatosis, Hyperlipidemia, Hypertension, IBS (irritable bowel syndrome), Morbid obesity (HCC), Myocardial infarction (HCC), OSA (obstructive sleep apnea), Pancreatitis, PE (pulmonary thromboembolism) (HCC), Peripheral neuropathy, PUD (peptic ulcer disease), Pulmonary embolism (HCC) (07/15/2021), Renal disorder, Stroke [facility]), Thoracic aortic ectasia, and Type 2 diabetes mellitus (HCC).  has a past surgical history that includes left heart catheterization with coronary angiogram (N/A, 02/03/2014); Cardiac catheterization (N/A, 05/31/2016); Cardiac catheterization (N/A, 05/31/2016); Cardiac catheterization (N/A, 05/31/2016); left leg stent ; LEFT HEART CATH AND CORONARY ANGIOGRAPHY (N/A, 12/08/2017); Esophagogastroduodenoscopy (N/A, 04/29/2020); Colonoscopy with propofol  (N/A, 04/29/2020); polypectomy  (04/29/2020); Colonoscopy (N/A, 07/17/2021); Esophagogastroduodenoscopy (egd) with propofol  (N/A, 07/17/2021); polypectomy (07/17/2021); Givens capsule study (N/A, 11/10/2022); Anterior cervical corpectomy (N/A, 03/13/2024); Colonoscopy (N/A, 07/09/2024); Esophagogastroduodenoscopy (N/A, 07/09/2024); and Biopsy of skin subcutaneous tissue and/or mucous membrane (07/09/2024). Prior to Admission medications   Medication Sig Start Date End Date Taking? Authorizing Provider  albuterol  (VENTOLIN  HFA) 108 (90 Base) MCG/ACT inhaler Inhale 2 puffs into the lungs every 6 (six) hours as needed for wheezing or shortness of breath.   Yes [provider]  atorvastatin  (LIPITOR ) 80 MG tablet Take 80 mg by mouth at bedtime. 07/04/23  Yes [provider]  carvedilol  (COREG ) 6.25 MG tablet Take 6.25 mg by mouth 2 (two) times daily.   Yes [provider]  COMBIVENT  RESPIMAT 20-100 MCG/ACT AERS respimat Inhale 1 puff into the lungs 4 (four) times daily as needed for wheezing or shortness of breath.   Yes [provider]  Continuous Glucose Sensor (FREESTYLE LIBRE 2 PLUS SENSOR) MISC Inject 1 Device into the skin See admin instructions. Place 1 new sensor into the skin every 15 days   Yes [provider]  Cyanocobalamin  (VITAMIN B12 PO) Take 1 tablet by mouth daily.   Yes [provider]  ELIQUIS  5 MG TABS tablet TAKE 1 TABLET BY MOUTH TWICE A DAY 10/03/22  Yes Shadad, Windell SAILOR, MD  fenofibrate  (TRICOR ) 145 MG tablet TAKE 1 TABLET (145 MG TOTAL) BY MOUTH DAILY. Patient taking differently: Take 145 mg by mouth at bedtime. 05/19/22  Yes Cleaver, Josefa HERO, NP  ferrous sulfate  325 (65 FE) MG tablet Take 1 tablet (325 mg total) by mouth daily. Patient taking differently: Take 325 mg by mouth daily with breakfast. 11/16/22 08/01/24 Yes Patel, Pranav M, MD  furosemide  (LASIX ) 40 MG tablet Take 40-80 mg by mouth See admin instructions. Take 80 mg by mouth in the morning and 40 mg  at night    Yes [provider]  gabapentin  (NEURONTIN ) 800 MG tablet Take 800 mg by mouth in the morning and at bedtime.   Yes [provider]  glipiZIDE  (GLUCOTROL ) 10 MG tablet Take 10 mg by mouth 2 (two) times daily. 05/30/17  Yes [provider]  HUMULIN  R U-500 KWIKPEN 500 UNIT/ML kwikpen Inject 95-120 Units into the skin See admin instructions. Inject 95-120 units into the skin three times a day with meals, per sliding scale 06/04/20  Yes [provider]  isosorbide  dinitrate (ISORDIL ) 30 MG tablet Take 30 mg by mouth 2 (two) times daily. 09/01/23  Yes [provider]  lisinopril  (ZESTRIL ) 20 MG tablet Take 20 mg by mouth daily. 05/02/23  Yes [provider]  Menthol , Topical Analgesic, (BIOFREEZE EX) Apply 1 application  topically as needed (knee pain).   Yes [provider]  methocarbamol  (ROBAXIN ) 500 MG tablet Take 1 tablet (500 mg total) by mouth every 6 (six) hours as needed for muscle spasms. 03/16/24  Yes Johnanna Credit Caylin, PA-C  metolazone (ZAROXOLYN) 2.5 MG tablet Take 2.5 mg by mouth in the morning.   Yes [provider]  nitroGLYCERIN  (NITROSTAT ) 0.4 MG SL tablet Place 1 tablet (0.4 mg total) under the tongue every 5 (five) minutes as needed for chest pain. 02/06/19  Yes Raford Riggs, MD  nystatin  (MYCOSTATIN ) 100000 UNIT/ML suspension Take 5 mLs by mouth See admin instructions. Swish 5 ml's in the mouth four times a day, then spit out 07/30/24 08/06/24 Yes [provider]  ondansetron  (ZOFRAN ) 4 MG tablet Take 8 mg by mouth daily as needed for vomiting or nausea. 04/21/20  Yes [provider]  Oxycodone  HCl 20 MG TABS Take 20 mg by mouth See admin instructions. Take 20 mg by mouth in the morning and an additional 20 mg up to three times a day as needed for pain   Yes [provider]  pantoprazole  (PROTONIX ) 40 MG tablet Take 40 mg by mouth daily before breakfast. 05/26/22  Yes [provider]  potassium chloride  SA (KLOR-CON  M) 20 MEQ tablet Take 20 mEq by mouth 2 (two) times daily. 02/27/24  Yes [provider]  sertraline  (ZOLOFT ) 50 MG tablet Take 50 mg by mouth every morning. 12/17/20  Yes [provider]  SSD 1 % cream Apply 1 Application topically daily as needed (open wounds). 02/13/24  Yes [provider]  tiZANidine  (ZANAFLEX ) 4 MG tablet Take 4 mg by mouth See admin instructions. Take 4 mg by mouth at bedtime and an additional 4 mg up to three times a day as needed for muscle spasms   Yes [provider]  traZODone  (DESYREL ) 50 MG tablet Take 1 tablet (50 mg total) by mouth at bedtime. 12/15/16  Yes Burns, Alexa R, MD  triamcinolone  cream (KENALOG ) 0.1 % Apply 1 Application topically daily as needed (itching).   Yes [provider]  Vitamin D , Ergocalciferol , (DRISDOL) 1.25 MG (50000 UT) CAPS capsule Take 50,000 Units by mouth every Tuesday. 08/15/19  Yes [provider]  B Complex-C (B-COMPLEX WITH VITAMIN C) tablet Take 1 tablet by mouth daily. Patient not taking: Reported on 08/01/2024 11/16/22   Tobie Yetta HERO, MD  docusate sodium  (COLACE) 100 MG capsule Take 1 capsule (100 mg total) by mouth 2 (two) times daily. Patient not taking: Reported on 08/01/2024 03/16/24   Tomlinson, Sara Caylin, PA-C  Insulin  Pen Needle 31G X 5 MM MISC Use 1 needle daily to  inject insulin  as prescribed 06/18/17   Ricky Fines, MD  oxyCODONE  (OXY IR/ROXICODONE ) 5 MG immediate release tablet Take 1 tablet (5 mg total) by mouth every 4 (four) hours as needed for moderate pain (pain score 4-6). Patient not taking: Reported on 08/01/2024 03/16/24   Johnanna Credit Caylin, PA-C  potassium chloride  (MICRO-K ) 10 MEQ CR capsule Take 2 capsules (20 mEq total) by mouth 2 (two) times daily. Patient not taking: Reported on 08/01/2024 07/21/22   Pietro Redell RAMAN, MD   Allergies  Allergen Reactions   Coconut Fatty Acid Hives   Ibuprofen Other (See Comments)     Made gastric ulcers worse and stomach upset   Aleve  [Naproxen ] Other (See Comments)    DUE TO KIDNEYS   Nsaids Other (See Comments)    Stomach ulcers    Vascepa  [Icosapent  Ethyl (Epa Ethyl Ester) (Fish)] Nausea And Vomiting and Other (See Comments)    Headaches, chest pain, similar to sx of a stroke, hypotension    Other Hives, Rash and Other (See Comments)    Nut Allergy- ALL   Peanut-Containing Drug Products Hives and Rash    FAMILY HISTORY:  family history includes Breast cancer in his mother; Cancer in his father; Diabetes in his brother, mother, and sister; Hypertension in his brother, mother, and sister. SOCIAL HISTORY:  reports that he has never smoked. He has been exposed to tobacco smoke. He has never used smokeless tobacco. He reports that he does not currently use drugs after having used the following drugs: Marijuana. He reports that he does not drink alcohol .   VITAL SIGNS: Temp:  [97.7 F (36.5 C)-98.6 F (37 C)] 97.8 F (36.6 C) (10/11 1600) Pulse Rate:  [60-85] 76 (10/11 1800) Resp:  [13-19] 13 (10/11 1800) BP: (77-160)/(28-105) 82/28 (10/11 1700) SpO2:  [90 %-100 %] 92 % (10/11 1800)  PHYSICAL EXAMINATION: Physical Exam Constitutional:      Appearance: He is well-developed.  Eyes:     Pupils: Pupils are equal, round, and reactive to light.  Cardiovascular:     Rate and Rhythm: Tachycardia present.     Comments: hypotension Pulmonary:     Comments: Auditory stridor present, able to pass lung sounds, in respiratory distress Abdominal:     Palpations: Abdomen is soft.  Musculoskeletal:        General: Normal range of motion.  Neurological:     General: No focal deficit present.     Mental Status: He is alert and oriented to person, place, and time.      Recent Labs  Lab 08/01/24 1532 08/02/24 0221 08/02/24 1338 08/03/24 0322  NA 138 137  --  136  K 2.3* 2.7* 3.0* 2.9*  CL 85* 85*  --  85*  CO2 36* 35*  --  36*  BUN 18 18  --  15  CREATININE  0.93 1.01  --  0.84  GLUCOSE 363* 331*  --  356*   Recent Labs  Lab 08/01/24 1532 08/02/24 0221  HGB 12.9* 12.8*  HCT 37.6* 39.6  WBC 11.0* 10.7*  PLT 213 223   No results found.  ASSESSMENT / PLAN:  Anaphylaxis 63 years old who with anaphylaxis who presented with stridor, hypertension, bronchospasm at the floor.  He received 125 Solu-Medrol , epinephrine  1 mg, Benadryl  50 mg, Pepcid  20 mg with significant improvement of his symptoms in a  few seconds.  It seems the possible cause could be allergy to nuts. No new medications. Plan: Patient will need EpiPen  at discharge.  I sign off.  Please reach out if there is any other questions.  Marny Patch, MD Pulmonary and Critical Care Medicine [facility] Pager: [phone]  08/03/2024, 7:14 PM

## 2024-08-03 NOTE — Progress Notes (Signed)
  Progress Note   Patient: [name] FMW:[id number] DOB: 04/21/1961 DOA: 08/01/2024     1 DOS: the patient was seen and examined on 08/03/2024   Brief hospital course: 63 year old man PMH including CAD, chronic diastolic CHF, chronic hypoxic respiratory failure on 4 L, he was sent to the emergency department by PCP for hypokalemia.  Admitted for severe hypokalemia, hypomagnesemia  Consultants None   Procedures/Events 10/9 admission  Assessment and Plan: Severe hypokalemia Hypomagnesemia Contraction alkalosis Thought related to recent diarrhea, Lasix  Remains hypokalemic.  Replete.  Replete magnesium .   Chronic diastolic CHF Chronic atypical chest pain Troponins minimally elevated, trivial, EKG nonischemic Appears stable.  Continue Imdur , carvedilol , Lasix    Chronic hypoxic respiratory failure OSA Continue 4 L nasal cannula as at home, does not use CPAP   Diabetes mellitus type 2 Lantus  as inpatient, sliding scale insulin , meal coverage Increase Lantus , markedly hyperglycemic, monitor   Essential hypertension Continue carvedilol , lisinopril  on hold   Chronic back pain Cervical radiculopathy, myelopathy Walks with walker and cane Continue gabapentin    Chronic leg wound Can follow-up at wound care clinic   Prolonged QTc Replete electrolytes and check EKG in a.m..  Addendum Shortly after seeing the patient early afternoon, I was called to bedside for acute respiratory distress.  Suddenly the patient developed stridor and respiratory distress.  On exam no audible stridor, respiratory distress, lungs clear to auscultation, heart tachycardic, regular rhythm, speech fluent and clear.  Hyperglycemic.  Became suddenly hypotensive.  Seen with critical care Dr. Adrien, treated for allergic reaction/anaphylaxis with epinephrine , Solu-Medrol , Pepcid , IV fluid bolus.  On review of medications, no new medications.  Lisinopril  discontinued.  Rapidly improved with above  interventions, hypotension resolved prior to completion of bolus and stridor resolved shortly after epinephrine  administration.  Transferred to stepdown for closer monitoring.    Subjective:  Approximately 1 PM patient was seen, at that time reported chronic headache but otherwise doing well.  Physical Exam: Vitals:   08/03/24 1301 08/03/24 1302 08/03/24 1315 08/03/24 1330  BP: (!) 128/105  (!) 77/50 (!) 130/46  Pulse: 82  60 62  Resp:      Temp: 98.6 F (37 C)     TempSrc: Oral     SpO2: 98% 100%    Weight:      Height:       Physical Exam Vitals reviewed.  Constitutional:      General: He is not in acute distress.    Appearance: He is not ill-appearing or toxic-appearing.  Cardiovascular:     Rate and Rhythm: Normal rate and regular rhythm.     Heart sounds: No murmur heard. Pulmonary:     Effort: Pulmonary effort is normal. No respiratory distress.     Breath sounds: No wheezing, rhonchi or rales.  Neurological:     Mental Status: He is alert.  Psychiatric:        Mood and Affect: Mood normal.        Behavior: Behavior normal.     Data Reviewed: Blood sugars elevated, up to 427 Potassium 2.9 Magnesium  1.9 Chloride 85  Family Communication: none  Disposition: Status is: Inpatient Remains inpatient appropriate because: see above     Time spent: 45 minutes  Author: Toribio Door, MD 08/03/2024 1:49 PM  For on call review [email or web address].

## 2024-08-03 NOTE — Progress Notes (Signed)
 Pt complains of SOB, chest pressure, and feeling like he will pass out. RT to bedside, MD to bedside, RRT RN to bedside. Verbal orders received. Patient responded to interventions. MD to transfer to SD for closer monitoring. Will continue to monitor.

## 2024-08-03 NOTE — Progress Notes (Signed)
 MD aware of low BP readings and is ok with disregarding current readings. 2 RN attempt to get manual BP reading failed.

## 2024-08-04 DIAGNOSIS — N179 Acute kidney failure, unspecified: Secondary | ICD-10-CM

## 2024-08-04 DIAGNOSIS — T782XXA Anaphylactic shock, unspecified, initial encounter: Secondary | ICD-10-CM | POA: Diagnosis not present

## 2024-08-04 DIAGNOSIS — E1110 Type 2 diabetes mellitus with ketoacidosis without coma: Secondary | ICD-10-CM | POA: Diagnosis not present

## 2024-08-04 DIAGNOSIS — E876 Hypokalemia: Secondary | ICD-10-CM | POA: Diagnosis not present

## 2024-08-04 LAB — BETA-HYDROXYBUTYRIC ACID
Beta-Hydroxybutyric Acid: 0.4 mmol/L — ABNORMAL HIGH (ref 0.05–0.27)
Beta-Hydroxybutyric Acid: 0.28 mmol/L — ABNORMAL HIGH (ref 0.05–0.27)
Beta-Hydroxybutyric Acid: 0.09 mmol/L (ref 0.05–0.27)
Beta-Hydroxybutyric Acid: 0.11 mmol/L (ref 0.05–0.27)

## 2024-08-04 LAB — BASIC METABOLIC PANEL WITH GFR
Anion gap: 17 — ABNORMAL HIGH (ref 5–15)
Anion gap: 16 — ABNORMAL HIGH (ref 5–15)
Anion gap: 15 (ref 5–15)
Anion gap: 14 (ref 5–15)
BUN: 35 mg/dL — ABNORMAL HIGH (ref 6–20)
BUN: 37 mg/dL — ABNORMAL HIGH (ref 6–20)
BUN: 39 mg/dL — ABNORMAL HIGH (ref 6–20)
BUN: 38 mg/dL — ABNORMAL HIGH (ref 6–20)
CO2: 30 mmol/L (ref 22–32)
CO2: 29 mmol/L (ref 22–32)
CO2: 31 mmol/L (ref 22–32)
CO2: 31 mmol/L (ref 22–32)
Calcium: 9.3 mg/dL (ref 8.9–10.3)
Calcium: 9 mg/dL (ref 8.9–10.3)
Calcium: 9.1 mg/dL (ref 8.9–10.3)
Calcium: 9.2 mg/dL (ref 8.9–10.3)
Chloride: 85 mmol/L — ABNORMAL LOW (ref 98–111)
Chloride: 86 mmol/L — ABNORMAL LOW (ref 98–111)
Chloride: 87 mmol/L — ABNORMAL LOW (ref 98–111)
Chloride: 89 mmol/L — ABNORMAL LOW (ref 98–111)
Creatinine, Ser: 3.15 mg/dL — ABNORMAL HIGH (ref 0.61–1.24)
Creatinine, Ser: 2.77 mg/dL — ABNORMAL HIGH (ref 0.61–1.24)
Creatinine, Ser: 2.44 mg/dL — ABNORMAL HIGH (ref 0.61–1.24)
Creatinine, Ser: 2.11 mg/dL — ABNORMAL HIGH (ref 0.61–1.24)
GFR, Estimated: 22 mL/min — ABNORMAL LOW (ref >60)
GFR, Estimated: 25 mL/min — ABNORMAL LOW (ref >60)
GFR, Estimated: 30 mL/min — ABNORMAL LOW (ref >60)
GFR, Estimated: 35 mL/min — ABNORMAL LOW (ref >60)
Glucose, Bld: 614 mg/dL (ref 70–99)
Glucose, Bld: 521 mg/dL (ref 70–99)
Glucose, Bld: 342 mg/dL — ABNORMAL HIGH (ref 70–99)
Glucose, Bld: 234 mg/dL — ABNORMAL HIGH (ref 70–99)
Potassium: 4.2 mmol/L (ref 3.5–5.1)
Potassium: 4 mmol/L (ref 3.5–5.1)
Potassium: 3.5 mmol/L (ref 3.5–5.1)
Potassium: 3.5 mmol/L (ref 3.5–5.1)
Sodium: 132 mmol/L — ABNORMAL LOW (ref 135–145)
Sodium: 131 mmol/L — ABNORMAL LOW (ref 135–145)
Sodium: 133 mmol/L — ABNORMAL LOW (ref 135–145)
Sodium: 135 mmol/L (ref 135–145)

## 2024-08-04 LAB — BLOOD GAS, VENOUS
Acid-Base Excess: 10.8 mmol/L — ABNORMAL HIGH (ref 0.0–2.0)
Bicarbonate: 39 mmol/L — ABNORMAL HIGH (ref 20.0–28.0)
O2 Saturation: 68.2 %
Patient temperature: 36.5
pCO2, Ven: 68 mmHg — ABNORMAL HIGH (ref 44–60)
pH, Ven: 7.37 (ref 7.25–7.43)
pO2, Ven: 38 mmHg (ref 32–45)

## 2024-08-04 LAB — GLUCOSE, CAPILLARY
Glucose-Capillary: 545 mg/dL (ref 70–99)
Glucose-Capillary: 534 mg/dL (ref 70–99)
Glucose-Capillary: 498 mg/dL — ABNORMAL HIGH (ref 70–99)
Glucose-Capillary: 501 mg/dL (ref 70–99)
Glucose-Capillary: 507 mg/dL (ref 70–99)
Glucose-Capillary: 490 mg/dL — ABNORMAL HIGH (ref 70–99)
Glucose-Capillary: 436 mg/dL — ABNORMAL HIGH (ref 70–99)
Glucose-Capillary: 446 mg/dL — ABNORMAL HIGH (ref 70–99)
Glucose-Capillary: 439 mg/dL — ABNORMAL HIGH (ref 70–99)
Glucose-Capillary: 354 mg/dL — ABNORMAL HIGH (ref 70–99)
Glucose-Capillary: 401 mg/dL — ABNORMAL HIGH (ref 70–99)
Glucose-Capillary: 351 mg/dL — ABNORMAL HIGH (ref 70–99)
Glucose-Capillary: 323 mg/dL — ABNORMAL HIGH (ref 70–99)
Glucose-Capillary: 347 mg/dL — ABNORMAL HIGH (ref 70–99)
Glucose-Capillary: 234 mg/dL — ABNORMAL HIGH (ref 70–99)
Glucose-Capillary: 229 mg/dL — ABNORMAL HIGH (ref 70–99)
Glucose-Capillary: 279 mg/dL — ABNORMAL HIGH (ref 70–99)
Glucose-Capillary: 270 mg/dL — ABNORMAL HIGH (ref 70–99)

## 2024-08-04 LAB — CBC
HCT: 37.2 % — ABNORMAL LOW (ref 39.0–52.0)
Hemoglobin: 12.2 g/dL — ABNORMAL LOW (ref 13.0–17.0)
MCH: 31.8 pg (ref 26.0–34.0)
MCHC: 32.8 g/dL (ref 30.0–36.0)
MCV: 96.9 fL (ref 80.0–100.0)
Platelets: 222 K/uL (ref 150–400)
RBC: 3.84 MIL/uL — ABNORMAL LOW (ref 4.22–5.81)
RDW: 13.4 % (ref 11.5–15.5)
WBC: 16.7 K/uL — ABNORMAL HIGH (ref 4.0–10.5)
nRBC: 0 % (ref 0.0–0.2)

## 2024-08-04 LAB — GLUCOSE, RANDOM: Glucose, Bld: 529 mg/dL (ref 70–99)

## 2024-08-04 LAB — MAGNESIUM: Magnesium: 2.3 mg/dL (ref 1.7–2.4)

## 2024-08-04 MED ORDER — INSULIN ASPART 100 UNIT/ML IJ SOLN
30.0000 [IU] | Freq: Once | INTRAMUSCULAR | Status: AC
  Administered 2024-08-04: 30 [IU] via SUBCUTANEOUS

## 2024-08-04 MED ORDER — INSULIN GLARGINE 100 UNIT/ML ~~LOC~~ SOLN
35.0000 [IU] | Freq: Every day | SUBCUTANEOUS | Status: DC
  Filled 2024-08-04: qty 0.35

## 2024-08-04 MED ORDER — LACTATED RINGERS IV BOLUS
500.0000 mL | Freq: Once | INTRAVENOUS | Status: AC
  Administered 2024-08-04: 500 mL via INTRAVENOUS

## 2024-08-04 MED ORDER — DEXTROSE 50 % IV SOLN: 0.0000 mL | INTRAVENOUS | Status: DC | PRN

## 2024-08-04 MED ORDER — INSULIN REGULAR(HUMAN) IN NACL 100-0.9 UT/100ML-% IV SOLN
INTRAVENOUS | Status: DC
  Administered 2024-08-04: 23 [IU]/h via INTRAVENOUS
  Administered 2024-08-04: 11.5 [IU]/h via INTRAVENOUS
  Administered 2024-08-04: 12 [IU]/h via INTRAVENOUS
  Administered 2024-08-05: 24 [IU]/h via INTRAVENOUS
  Administered 2024-08-05: 20 [IU]/h via INTRAVENOUS
  Filled 2024-08-04 (×5): qty 100

## 2024-08-04 MED ORDER — INSULIN ASPART 100 UNIT/ML IJ SOLN
25.0000 [IU] | Freq: Once | INTRAMUSCULAR | Status: AC
  Administered 2024-08-04: 25 [IU] via SUBCUTANEOUS

## 2024-08-04 MED ORDER — LACTATED RINGERS IV BOLUS
1000.0000 mL | Freq: Once | INTRAVENOUS | Status: AC
  Administered 2024-08-04: 1000 mL via INTRAVENOUS

## 2024-08-04 MED ORDER — DEXTROSE IN LACTATED RINGERS 5 % IV SOLN: INTRAVENOUS | Status: DC

## 2024-08-04 MED ORDER — LACTATED RINGERS IV SOLN: INTRAVENOUS | Status: DC

## 2024-08-04 MED ORDER — ALUM & MAG HYDROXIDE-SIMETH 200-200-20 MG/5ML PO SUSP
30.0000 mL | ORAL | Status: DC | PRN
  Administered 2024-08-04: 30 mL via ORAL
  Filled 2024-08-04: qty 30

## 2024-08-04 NOTE — Inpatient Diabetes Management (Signed)
 Inpatient Diabetes Program Recommendations  AACE/ADA: New Consensus Statement on Inpatient Glycemic Control (2015)  Target Ranges:  Prepandial:   less than 140 mg/dL      Peak postprandial:   less than 180 mg/dL (1-2 hours)      Critically ill patients:  140 - 180 mg/dL   Lab Results  Component Value Date   GLUCAP 354 (H) 08/04/2024   HGBA1C 9.1 (H) 03/04/2024    Review of Glycemic Control  Diabetes history: DM2 Outpatient Diabetes medications: U-500 95-120 units TID, Glipizide  10 mg BID, Freestyle Libre 2  Current orders for Inpatient glycemic control: IV insulin  per [employer] for DKA  Inpatient Diabetes Program Recommendations:    If pt is eating: begin U-500 insulin  1H prior to discontinuation of drip  U-500 60 units TID Novolog  Q4H x 12H then TID with meals and 0-5 HS  Titrate as needed.  Will see pt in am.  Thank you. Shona Brandy, RD, LDN, CDCES Inpatient Diabetes Coordinator [phone]

## 2024-08-04 NOTE — Plan of Care (Signed)
   Problem: Fluid Volume: Goal: Ability to maintain a balanced intake and output will improve Outcome: Progressing

## 2024-08-04 NOTE — Progress Notes (Signed)
 Progress Note   Patient: [name] FMW:[id number] DOB: June 20, 1961 DOA: 08/01/2024     2 DOS: the patient was seen and examined on 08/04/2024   Brief hospital course: 63 year old man PMH including CAD, chronic diastolic CHF, chronic hypoxic respiratory failure on 4 L, he was sent to the emergency department by PCP for hypokalemia.  Admitted for severe hypokalemia, hypomagnesemia, electrolytes repleted but developed sudden stridor and hypotension consistent with anaphylaxis requiring transfer to the ICU.  Cause of anaphylaxis unclear, no apparent new medications or foods.  Subsequently developed DKA  Consultants Critical care  Procedures/Events 10/9 admission 10/11 sudden stridor and anaphylaxis  10/12 development of AKI, DKA  Assessment and Plan: Severe hypokalemia -- resolved Hypomagnesemia --resolved Contraction alkalosis Thought related to recent diarrhea, Lasix   Anaphylactic shock Sudden onset 10/11, unclear etiology.  Developed stridor and marked hypotension, responded rapidly to Solu-Medrol , H2 blocker, epinephrine  No apparent new foods, no new medications.  Inciting event unclear. Appears clinically resolved EpiPen  on discharge  DKA DM type 2 Developed 10/12, placed on insulin  infusion Probably tipped off by administration of steroids for anaphylaxis  AKI Creatinine up from 0.84 yesterday morning to 3.15 today with poor urine output.  Suspect related to hypotension from anaphylaxis, complicated by DKA Bolus IV fluids then maintenance, bladder scans, trend BMP, strict I/O   Chronic diastolic CHF Chronic atypical chest pain Troponins minimally elevated, trivial, EKG nonischemic Appears stable.  Holding Imdur  and carvedilol  for hypotension earlier. Stop Lasix    Acute on chronic hypoxic respiratory failure (4 L) OSA Currently on 6 L  Does not use CPAP, suspect may have sleep apnea   Essential hypertension Hypotension resolved Carvedilol  and lisinopril  on  hold   Chronic back pain Cervical radiculopathy, myelopathy Walks with walker and cane Hold gabapentin  given AKI   Chronic leg wound Can follow-up at wound care clinic   Prolonged QTc Follow EKG  EpiPen  at discharge.    Subjective:  No issues overnight except BP still low Very sleepy per nursing No UOP for awhile  Physical Exam: Vitals:   08/04/24 0900 08/04/24 1000 08/04/24 1043 08/04/24 1100  BP: (!) 102/47 (!) 91/46  (!) 118/59  Pulse: 80 78  78  Resp: 13 10  17   Temp:   97.9 F (36.6 C)   TempSrc:   Oral   SpO2: 92% 94%  93%  Weight:      Height:       Physical Exam Vitals reviewed.  Constitutional:      General: He is not in acute distress.    Appearance: He is ill-appearing. He is not toxic-appearing.  Cardiovascular:     Rate and Rhythm: Normal rate and regular rhythm.     Heart sounds: No murmur heard. Pulmonary:     Effort: Pulmonary effort is normal. No respiratory distress.     Breath sounds: No wheezing, rhonchi or rales.  Neurological:     Mental Status: He is alert.  Psychiatric:        Mood and Affect: Mood normal.        Behavior: Behavior normal.     Data Reviewed: CBG high overnight into the 500s Potassium within normal limits, creatinine nearly doubled, up to 3.15, anion gap up to 17 WBC up to 16.7 after steroids, hemoglobin stable  Family Communication: none  Disposition: Status is: Inpatient Remains inpatient appropriate because: hyperglycemia, AKI     Time spent: 35 critical care minutes  Author: Toribio Door, MD 08/04/2024 12:11 PM  For on  call review [email or web address].

## 2024-08-05 DIAGNOSIS — T782XXA Anaphylactic shock, unspecified, initial encounter: Secondary | ICD-10-CM | POA: Diagnosis not present

## 2024-08-05 DIAGNOSIS — N179 Acute kidney failure, unspecified: Secondary | ICD-10-CM | POA: Diagnosis not present

## 2024-08-05 DIAGNOSIS — E876 Hypokalemia: Secondary | ICD-10-CM | POA: Diagnosis not present

## 2024-08-05 DIAGNOSIS — E1110 Type 2 diabetes mellitus with ketoacidosis without coma: Secondary | ICD-10-CM | POA: Diagnosis not present

## 2024-08-05 LAB — BASIC METABOLIC PANEL WITH GFR
Anion gap: 13 (ref 5–15)
Anion gap: 12 (ref 5–15)
Anion gap: 14 (ref 5–15)
BUN: 39 mg/dL — ABNORMAL HIGH (ref 6–20)
BUN: 38 mg/dL — ABNORMAL HIGH (ref 6–20)
BUN: 35 mg/dL — ABNORMAL HIGH (ref 6–20)
CO2: 33 mmol/L — ABNORMAL HIGH (ref 22–32)
CO2: 34 mmol/L — ABNORMAL HIGH (ref 22–32)
CO2: 33 mmol/L — ABNORMAL HIGH (ref 22–32)
Calcium: 9 mg/dL (ref 8.9–10.3)
Calcium: 9.1 mg/dL (ref 8.9–10.3)
Calcium: 9.4 mg/dL (ref 8.9–10.3)
Chloride: 91 mmol/L — ABNORMAL LOW (ref 98–111)
Chloride: 91 mmol/L — ABNORMAL LOW (ref 98–111)
Chloride: 91 mmol/L — ABNORMAL LOW (ref 98–111)
Creatinine, Ser: 1.92 mg/dL — ABNORMAL HIGH (ref 0.61–1.24)
Creatinine, Ser: 1.65 mg/dL — ABNORMAL HIGH (ref 0.61–1.24)
Creatinine, Ser: 1.54 mg/dL — ABNORMAL HIGH (ref 0.61–1.24)
GFR, Estimated: 39 mL/min — ABNORMAL LOW (ref >60)
GFR, Estimated: 47 mL/min — ABNORMAL LOW (ref >60)
GFR, Estimated: 51 mL/min — ABNORMAL LOW (ref >60)
Glucose, Bld: 233 mg/dL — ABNORMAL HIGH (ref 70–99)
Glucose, Bld: 170 mg/dL — ABNORMAL HIGH (ref 70–99)
Glucose, Bld: 178 mg/dL — ABNORMAL HIGH (ref 70–99)
Potassium: 3.3 mmol/L — ABNORMAL LOW (ref 3.5–5.1)
Potassium: 3.5 mmol/L (ref 3.5–5.1)
Potassium: 3.9 mmol/L (ref 3.5–5.1)
Sodium: 136 mmol/L (ref 135–145)
Sodium: 137 mmol/L (ref 135–145)
Sodium: 138 mmol/L (ref 135–145)

## 2024-08-05 LAB — GLUCOSE, CAPILLARY
Glucose-Capillary: 153 mg/dL — ABNORMAL HIGH (ref 70–99)
Glucose-Capillary: 154 mg/dL — ABNORMAL HIGH (ref 70–99)
Glucose-Capillary: 155 mg/dL — ABNORMAL HIGH (ref 70–99)
Glucose-Capillary: 160 mg/dL — ABNORMAL HIGH (ref 70–99)
Glucose-Capillary: 178 mg/dL — ABNORMAL HIGH (ref 70–99)
Glucose-Capillary: 186 mg/dL — ABNORMAL HIGH (ref 70–99)
Glucose-Capillary: 188 mg/dL — ABNORMAL HIGH (ref 70–99)
Glucose-Capillary: 191 mg/dL — ABNORMAL HIGH (ref 70–99)
Glucose-Capillary: 201 mg/dL — ABNORMAL HIGH (ref 70–99)
Glucose-Capillary: 204 mg/dL — ABNORMAL HIGH (ref 70–99)
Glucose-Capillary: 205 mg/dL — ABNORMAL HIGH (ref 70–99)
Glucose-Capillary: 210 mg/dL — ABNORMAL HIGH (ref 70–99)
Glucose-Capillary: 215 mg/dL — ABNORMAL HIGH (ref 70–99)
Glucose-Capillary: 231 mg/dL — ABNORMAL HIGH (ref 70–99)

## 2024-08-05 LAB — BETA-HYDROXYBUTYRIC ACID: Beta-Hydroxybutyric Acid: 0.1 mmol/L (ref 0.05–0.27)

## 2024-08-05 MED ORDER — ORAL CARE MOUTH RINSE: 15.0000 mL | OROMUCOSAL | Status: DC | PRN

## 2024-08-05 MED ORDER — INSULIN ASPART 100 UNIT/ML IJ SOLN
0.0000 [IU] | Freq: Every day | INTRAMUSCULAR | Status: DC
  Administered 2024-08-05: 2 [IU] via SUBCUTANEOUS

## 2024-08-05 MED ORDER — POTASSIUM CHLORIDE CRYS ER 20 MEQ PO TBCR
40.0000 meq | EXTENDED_RELEASE_TABLET | ORAL | Status: AC
  Administered 2024-08-05 (×2): 40 meq via ORAL
  Filled 2024-08-05 (×2): qty 2

## 2024-08-05 MED ORDER — INSULIN REGULAR HUMAN (CONC) 500 UNIT/ML ~~LOC~~ SOPN
60.0000 [IU] | PEN_INJECTOR | Freq: Three times a day (TID) | SUBCUTANEOUS | Status: DC
Start: 2024-08-05 — End: 2024-08-06
  Administered 2024-08-05 – 2024-08-06 (×5): 60 [IU] via SUBCUTANEOUS
  Filled 2024-08-05: qty 3

## 2024-08-05 MED ORDER — INSULIN ASPART 100 UNIT/ML IJ SOLN
0.0000 [IU] | Freq: Three times a day (TID) | INTRAMUSCULAR | Status: DC
  Administered 2024-08-06: 7 [IU] via SUBCUTANEOUS
  Administered 2024-08-06: 4 [IU] via SUBCUTANEOUS
  Administered 2024-08-06 – 2024-08-07 (×2): 7 [IU] via SUBCUTANEOUS
  Administered 2024-08-07: 4 [IU] via SUBCUTANEOUS

## 2024-08-05 MED ORDER — OXYCODONE HCL 5 MG PO TABS
5.0000 mg | ORAL_TABLET | ORAL | Status: AC | PRN
  Administered 2024-08-05 – 2024-08-07 (×3): 10 mg via ORAL
  Filled 2024-08-05 (×3): qty 2

## 2024-08-05 MED ORDER — INSULIN ASPART 100 UNIT/ML IJ SOLN
0.0000 [IU] | INTRAMUSCULAR | Status: AC
  Administered 2024-08-05 (×3): 4 [IU] via SUBCUTANEOUS

## 2024-08-05 NOTE — Progress Notes (Signed)
   08/05/24 1621  TOC Brief Assessment  Insurance and Status Reviewed  Patient has primary care physician Yes  Home environment has been reviewed Apartment  Prior level of function: Mod independent with ADL's  Prior/Current Home Services No current home services  Social Drivers of Health Review SDOH reviewed no interventions necessary  Readmission risk has been reviewed Yes  Transition of care needs transition of care needs identified, TOC will continue to follow

## 2024-08-05 NOTE — Progress Notes (Signed)
 Progress Note   Patient: [name] FMW:[id number] DOB: 20-Apr-1961 DOA: 08/01/2024     3 DOS: the patient was seen and examined on 08/05/2024   Brief hospital course: 63 year old man PMH including CAD, chronic diastolic CHF, chronic hypoxic respiratory failure on 4 L, he was sent to the emergency department by PCP for hypokalemia.  Admitted for severe hypokalemia, hypomagnesemia, electrolytes repleted but developed sudden stridor and hypotension consistent with anaphylaxis requiring transfer to the ICU.  Cause of anaphylaxis unclear, no apparent new medications or foods.  Subsequently developed DKA.  Consultants Critical care  Procedures/Events 10/9 admission 10/11 sudden stridor and anaphylaxis  10/12 development of AKI, DKA 10/13 DKA resolved, renal function improving, no recurrent anaphylaxis  Assessment and Plan: Severe hypokalemia -- resolved Hypomagnesemia --resolved Contraction alkalosis -- resolving Thought related to recent diarrhea, Lasix    Anaphylactic shock -- resolved Sudden onset 10/11, unclear etiology.  Developed stridor and marked hypotension, responded rapidly to Solu-Medrol , H2 blocker, epinephrine  No apparent new foods, no new medications.  Inciting event unclear. Clinically resolved EpiPen  on discharge   DKA DM type 2 Developed 10/12, placed on insulin  infusion.  Resolved 10/13, transition to subcutaneous insulin . Probably tipped off by administration of steroids for anaphylaxis   AKI Creatinine up from 0.84 to 3.15 at peak, in context of anaphylactic shock, trending down now with creatinine 1.54 today.  Good urine output.  Suspect related to hypotension from anaphylaxis, complicated by DKA Resolving nicely.   Chronic diastolic CHF Chronic atypical chest pain Troponins minimally elevated, trivial, EKG nonischemic Remained stable.  Continue to hold Imdur  and carvedilol  for blood pressures Lasix  on hold, perhaps resume next 1 to 2 days   Acute on  chronic hypoxic respiratory failure (4 L) OSA Down to 5 L Does not use CPAP, suspect may have sleep apnea   Essential hypertension Hypotension resolved Carvedilol  and lisinopril  on hold   Chronic back pain Cervical radiculopathy, myelopathy Walks with walker and cane Hold gabapentin  given AKI   Chronic leg wound Can follow-up at wound care clinic   Prolonged QTc Nearly resolved on last EKG 10/12   EpiPen  at discharge.  Much improved, if blood sugar stable can likely transfer out later today and discharged in the next 1 to 2 days.     Subjective:  Feeling ok today  Physical Exam: Vitals:   08/05/24 0700 08/05/24 0744 08/05/24 0800 08/05/24 1131  BP: (!) 125/56     Pulse:   85   Resp: 14  17   Temp:  97.8 F (36.6 C)  98.3 F (36.8 C)  TempSrc:  Oral  Oral  SpO2:   (!) 78%   Weight:      Height:       Physical Exam Vitals reviewed.  Constitutional:      General: He is not in acute distress.    Appearance: He is not ill-appearing or toxic-appearing.  Cardiovascular:     Rate and Rhythm: Normal rate and regular rhythm.     Heart sounds: No murmur heard. Pulmonary:     Effort: Pulmonary effort is normal. No respiratory distress.     Breath sounds: No wheezing, rhonchi or rales.  Neurological:     Mental Status: He is alert.  Psychiatric:        Mood and Affect: Mood normal.        Behavior: Behavior normal.     Data Reviewed: CBG satble, AG WNL, BHB down to 0.1 last check  Family Communication: none  Disposition: Status is: Inpatient Remains inpatient appropriate because: s/p DKA     Time spent: 35 minutes  Author: Toribio Door, MD 08/05/2024 12:20 PM  For on call review [email or web address].

## 2024-08-06 ENCOUNTER — Ambulatory Visit: Admitting: Podiatry

## 2024-08-06 DIAGNOSIS — T782XXA Anaphylactic shock, unspecified, initial encounter: Secondary | ICD-10-CM | POA: Diagnosis not present

## 2024-08-06 DIAGNOSIS — N179 Acute kidney failure, unspecified: Secondary | ICD-10-CM | POA: Diagnosis not present

## 2024-08-06 DIAGNOSIS — E876 Hypokalemia: Secondary | ICD-10-CM | POA: Diagnosis not present

## 2024-08-06 DIAGNOSIS — E1122 Type 2 diabetes mellitus with diabetic chronic kidney disease: Secondary | ICD-10-CM | POA: Diagnosis not present

## 2024-08-06 LAB — BASIC METABOLIC PANEL WITH GFR
Anion gap: 13 (ref 5–15)
BUN: 21 mg/dL — ABNORMAL HIGH (ref 6–20)
CO2: 33 mmol/L — ABNORMAL HIGH (ref 22–32)
Calcium: 9.4 mg/dL (ref 8.9–10.3)
Chloride: 94 mmol/L — ABNORMAL LOW (ref 98–111)
Creatinine, Ser: 0.81 mg/dL (ref 0.61–1.24)
GFR, Estimated: >60 mL/min (ref >60)
Glucose, Bld: 246 mg/dL — ABNORMAL HIGH (ref 70–99)
Potassium: 3.8 mmol/L (ref 3.5–5.1)
Sodium: 140 mmol/L (ref 135–145)

## 2024-08-06 LAB — GLUCOSE, CAPILLARY
Glucose-Capillary: 237 mg/dL — ABNORMAL HIGH (ref 70–99)
Glucose-Capillary: 223 mg/dL — ABNORMAL HIGH (ref 70–99)
Glucose-Capillary: 187 mg/dL — ABNORMAL HIGH (ref 70–99)
Glucose-Capillary: 160 mg/dL — ABNORMAL HIGH (ref 70–99)

## 2024-08-06 MED ORDER — METOLAZONE 2.5 MG PO TABS
2.5000 mg | ORAL_TABLET | Freq: Every morning | ORAL | Status: DC
  Administered 2024-08-07: 2.5 mg via ORAL
  Filled 2024-08-06: qty 1

## 2024-08-06 MED ORDER — FUROSEMIDE 40 MG PO TABS
40.0000 mg | ORAL_TABLET | Freq: Every evening | ORAL | Status: DC
  Administered 2024-08-06: 40 mg via ORAL
  Filled 2024-08-06: qty 1

## 2024-08-06 MED ORDER — FUROSEMIDE 40 MG PO TABS: 40.0000 mg | ORAL_TABLET | ORAL | Status: DC

## 2024-08-06 MED ORDER — FUROSEMIDE 40 MG PO TABS
80.0000 mg | ORAL_TABLET | Freq: Every morning | ORAL | Status: DC
  Administered 2024-08-07: 80 mg via ORAL
  Filled 2024-08-06: qty 2

## 2024-08-06 MED ORDER — ISOSORBIDE DINITRATE 20 MG PO TABS
30.0000 mg | ORAL_TABLET | Freq: Two times a day (BID) | ORAL | Status: DC
  Administered 2024-08-06 – 2024-08-07 (×3): 30 mg via ORAL
  Filled 2024-08-06 (×3): qty 1

## 2024-08-06 MED ORDER — INSULIN REGULAR HUMAN (CONC) 500 UNIT/ML ~~LOC~~ SOPN
65.0000 [IU] | PEN_INJECTOR | Freq: Three times a day (TID) | SUBCUTANEOUS | Status: DC
  Administered 2024-08-06 – 2024-08-07 (×3): 65 [IU] via SUBCUTANEOUS
  Filled 2024-08-06: qty 3

## 2024-08-06 MED ORDER — CARVEDILOL 6.25 MG PO TABS
6.2500 mg | ORAL_TABLET | Freq: Two times a day (BID) | ORAL | Status: DC
  Administered 2024-08-06 – 2024-08-07 (×3): 6.25 mg via ORAL
  Filled 2024-08-06 (×3): qty 1

## 2024-08-06 MED ORDER — FUROSEMIDE 40 MG PO TABS
40.0000 mg | ORAL_TABLET | Freq: Every evening | ORAL | Status: DC
Start: 2024-08-07 — End: 2024-08-06

## 2024-08-06 MED ORDER — IPRATROPIUM-ALBUTEROL 0.5-2.5 (3) MG/3ML IN SOLN
3.0000 mL | Freq: Two times a day (BID) | RESPIRATORY_TRACT | Status: DC
  Administered 2024-08-07: 3 mL via RESPIRATORY_TRACT
  Filled 2024-08-06 (×2): qty 3

## 2024-08-06 MED ORDER — FUROSEMIDE 40 MG PO TABS: 80.0000 mg | ORAL_TABLET | Freq: Every morning | ORAL | Status: DC

## 2024-08-06 NOTE — Plan of Care (Signed)
  Problem: Metabolic: Goal: Ability to maintain appropriate glucose levels will improve Outcome: Progressing   Problem: Nutritional: Goal: Maintenance of adequate nutrition will improve Outcome: Progressing

## 2024-08-06 NOTE — Inpatient Diabetes Management (Signed)
 Inpatient Diabetes Program Recommendations  AACE/ADA: New Consensus Statement on Inpatient Glycemic Control (2015)  Target Ranges:  Prepandial:   less than 140 mg/dL      Peak postprandial:   less than 180 mg/dL (1-2 hours)      Critically ill patients:  140 - 180 mg/dL   Lab Results  Component Value Date   GLUCAP 237 (H) 08/06/2024   HGBA1C 9.1 (H) 03/04/2024    Review of Glycemic Control  Diabetes history: DM2 Outpatient Diabetes medications: U-500 95-120 units TID, Glipizide  10 mg BID, Freestyle Libre 2  Current orders for Inpatient glycemic control: U-500 60 units BID, Novolog  0-20 TID with meals and 0-5 HS  Inpatient Diabetes Program Recommendations:    Increase U-500 to 65 units TID  For discharge - send home on home U-500 dose. Pt will need to f/u with Endo after hospitalization.  Need updated HgbA1C.  Continue to follow.  Thank you. Shona Brandy, RD, LDN, CDCES Inpatient Diabetes Coordinator [phone]

## 2024-08-06 NOTE — Progress Notes (Addendum)
 Progress Note   Patient: [name] FMW:[id number] DOB: 1961-05-06 DOA: 08/01/2024     4 DOS: the patient was seen and examined on 08/06/2024   Brief hospital course: 63 year old man PMH including CAD, chronic diastolic CHF, chronic hypoxic respiratory failure on 4 L, he was sent to the emergency department by PCP for hypokalemia.  Admitted for severe hypokalemia, hypomagnesemia, electrolytes repleted but developed sudden stridor and hypotension consistent with anaphylaxis requiring transfer to the ICU.  Cause of anaphylaxis unclear, no apparent new medications or foods.  Subsequently developed DKA which resolved with standard treatment.  AKI resolved with fluids.  Nearing discharge.  Consultants Critical care  Procedures/Events 10/9 admission 10/11 sudden stridor and anaphylaxis  10/12 development of AKI, DKA 10/13 DKA resolved, renal function improving, no recurrent anaphylaxis  Assessment and Plan: Severe hypokalemia -- resolved Hypomagnesemia --resolved Contraction alkalosis -- resolving Thought related to recent diarrhea, Lasix  Resolved   Anaphylactic shock -- resolved Sudden onset 10/11, unclear etiology.  Developed stridor and marked hypotension, responded rapidly to Solu-Medrol , H2 blocker, epinephrine  No apparent new foods, no new medications.  Inciting event unclear. Clinically resolved EpiPen  on discharge   DKA DM type 2 Developed 10/12, placed on insulin  infusion.  Resolved 10/13, transitioned to subcutaneous insulin . Probably tipped off by administration of steroids for anaphylaxis CBG at bed high, will increase U-500   AKI Creatinine up from 0.84 to 3.15 at peak, in context of anaphylactic shock, complicated by DKA Resolved   Chronic diastolic CHF Chronic atypical chest pain Troponins minimally elevated, trivial, EKG nonischemic Hypertensive again.  Resume Imdur , carvedilol , Lasix .   Acute on chronic hypoxic respiratory failure (4 L) OSA Acute  component resolved. Does not use CPAP, suspect may have sleep apnea   Essential hypertension Hypotension resolved Carvedilol  resumed   Chronic back pain Cervical radiculopathy, myelopathy Walks with walker and cane Can resume gabapentin  tomorrow   Chronic leg wound Can follow-up at wound care clinic   Prolonged QTc Resolved on EKG 10/14   Prescribed EpiPen  at discharge.      Subjective:  Doesn't feel great today Breathing ok Had some nose bleeds last night Some swelling in hands  Physical Exam: Vitals:   08/06/24 0738 08/06/24 0930 08/06/24 1200 08/06/24 1222  BP: (!) 158/59   (!) 157/69  Pulse: 78  85 76  Resp: 14  (!) 26 16  Temp: 98.1 F (36.7 C)  97.8 F (36.6 C)   TempSrc: Oral  Oral   SpO2: 92% 94% 92% 94%  Weight:      Height:       Physical Exam Vitals reviewed.  Constitutional:      General: He is not in acute distress.    Appearance: He is not ill-appearing or toxic-appearing.  Cardiovascular:     Rate and Rhythm: Normal rate and regular rhythm.     Heart sounds: No murmur heard. Pulmonary:     Effort: Pulmonary effort is normal. No respiratory distress.     Breath sounds: No wheezing, rhonchi or rales.  Neurological:     Mental Status: He is alert.  Psychiatric:        Mood and Affect: Mood normal.        Behavior: Behavior normal.     Data Reviewed: CBG stable K+ 3.8 Creatinine 0.81 EKG -- QTc WNL  Family Communication: none  Disposition: Status is: Inpatient Remains inpatient appropriate because: see above, hyperglycemia     Time spent: 20 minutes  Author: Toribio Door, MD 08/06/2024  12:55 PM  For on call review [email or web address].

## 2024-08-07 ENCOUNTER — Encounter (HOSPITAL_BASED_OUTPATIENT_CLINIC_OR_DEPARTMENT_OTHER): Admitting: Internal Medicine

## 2024-08-07 DIAGNOSIS — E876 Hypokalemia: Secondary | ICD-10-CM | POA: Diagnosis not present

## 2024-08-07 LAB — MAGNESIUM: Magnesium: 2.2 mg/dL (ref 1.7–2.4)

## 2024-08-07 LAB — CBC WITH DIFFERENTIAL/PLATELET
Abs Immature Granulocytes: 0.02 K/uL (ref 0.00–0.07)
Basophils Absolute: 0 K/uL (ref 0.0–0.1)
Basophils Relative: 0 %
Eosinophils Absolute: 0.1 K/uL (ref 0.0–0.5)
Eosinophils Relative: 1 %
HCT: 39.7 % (ref 39.0–52.0)
Hemoglobin: 12.8 g/dL — ABNORMAL LOW (ref 13.0–17.0)
Immature Granulocytes: 0 %
Lymphocytes Relative: 22 %
Lymphs Abs: 2.1 K/uL (ref 0.7–4.0)
MCH: 31.5 pg (ref 26.0–34.0)
MCHC: 32.2 g/dL (ref 30.0–36.0)
MCV: 97.8 fL (ref 80.0–100.0)
Monocytes Absolute: 0.8 K/uL (ref 0.1–1.0)
Monocytes Relative: 9 %
Neutro Abs: 6.6 K/uL (ref 1.7–7.7)
Neutrophils Relative %: 68 %
Platelets: 222 K/uL (ref 150–400)
RBC: 4.06 MIL/uL — ABNORMAL LOW (ref 4.22–5.81)
RDW: 13.4 % (ref 11.5–15.5)
WBC: 9.7 K/uL (ref 4.0–10.5)
nRBC: 0 % (ref 0.0–0.2)

## 2024-08-07 LAB — BASIC METABOLIC PANEL WITH GFR
Anion gap: 12 (ref 5–15)
BUN: 16 mg/dL (ref 6–20)
CO2: 33 mmol/L — ABNORMAL HIGH (ref 22–32)
Calcium: 10 mg/dL (ref 8.9–10.3)
Chloride: 94 mmol/L — ABNORMAL LOW (ref 98–111)
Creatinine, Ser: 0.91 mg/dL (ref 0.61–1.24)
GFR, Estimated: >60 mL/min (ref >60)
Glucose, Bld: 224 mg/dL — ABNORMAL HIGH (ref 70–99)
Potassium: 3.8 mmol/L (ref 3.5–5.1)
Sodium: 139 mmol/L (ref 135–145)

## 2024-08-07 LAB — GLUCOSE, CAPILLARY
Glucose-Capillary: 162 mg/dL — ABNORMAL HIGH (ref 70–99)
Glucose-Capillary: 218 mg/dL — ABNORMAL HIGH (ref 70–99)

## 2024-08-07 MED ORDER — OXYCODONE HCL 5 MG PO TABS: 5.0000 mg | ORAL_TABLET | ORAL | Status: DC | PRN

## 2024-08-07 MED ORDER — EPINEPHRINE 0.3 MG/0.3ML IJ SOAJ: 0.3000 mg | INTRAMUSCULAR | 0 refills | Status: AC | PRN

## 2024-08-07 NOTE — Discharge Summary (Signed)
 Physician Discharge Summary  [name] FMW:[id number] DOB: [birth year] DOA: 08/01/2024  PCP: Abdul Fine, MD  Admit date: 08/01/2024 Discharge date: 08/07/2024  Admitted From: Home Disposition: Home  Recommendations for Outpatient Follow-up:  Follow up with PCP in 1 week with repeat CBC/BMP Recommend outpatient evaluation by allergy specialist. Follow up in ED if symptoms worsen or new appear   Home Health: No Equipment/Devices: None  Discharge Condition: Stable CODE STATUS: Full Diet recommendation: Heart healthy  Brief/Interim Summary: 63 year old man PMH including CAD, chronic diastolic CHF, chronic hypoxic respiratory failure on 4 L, he was sent to the emergency department by PCP for hypokalemia. Admitted for severe hypokalemia, hypomagnesemia, electrolytes repleted but developed sudden stridor and hypotension consistent with anaphylaxis requiring transfer to the ICU. Cause of anaphylaxis unclear, no apparent new medications or foods. Subsequently developed DKA which resolved with standard treatment. AKI resolved with fluids.  Subsequently, patient remains medically stable and patient feels okay to go home today.  He will be discharged home today with outpatient follow-up with PCP.  Discharge Diagnoses:   Severe hypokalemia: Resolved Hypomagnesemia: Resolved Contraction alkalosis: Improving - Thought related to recent diarrhea and Lasix  use.  Electrolyte abnormalities have mostly resolved.  Lasix  has been restarted.  Continue potassium supplementation on discharge.  Outpatient follow-up of electrolytes. - Patient feels better and wants to go home today.  Discharge patient home today.  Anaphylactic shock: Resolved - Sudden onset on 08/03/2024, unclear etiology.  Developed stridor and marked hypotension, rapidly responded to Solu-Medrol , H2 blocker and epinephrine .  No apparent few new foods, no new medications.  Lisinopril  has been discontinued. - Clinically resolved.   Will need EpiPen  on discharge.  Apparently has EpiPen  at home.  Will still send a new prescription for EpiPen .  Recommend outpatient evaluation and follow-up by allergy specialist  DKA type II in a patient with uncontrolled diabetes mellitus - Developed on 08/04/2024 requiring insulin  drip.  Resolved on 08/05/2024 and transition to subcutaneous insulin .  Blood sugars on the higher side but improving.  Carb modified diet.  Continue home regimen.  Outpatient follow-up with PCP.  AKI - Creatinine up from 0.84-3.15 at peak.  Possibly due to anaphylactic shock.  Complicated by DKA.  Resolved.  Creatinine at baseline currently.  Lisinopril  to remain on hold  Leukocytosis - Resolved  Chronic diastolic CHF Chronic atypical chest pain Minimally elevated troponin, EKG nonischemic Hypertension -Patient has already been resumed on Coreg , metolazone, Lasix  and Imdur .  Outpatient follow-up with PCP and/or cardiology  Acute on chronic respiratory failure with hypoxia Possible OSA - Possibly in the setting of anaphylactic shock.  Required up to 6 L oxygen  via nasal cannula.  Currently improved.  Normally wears 4 L oxygen  via nasal cannula at home.  Outpatient follow-up with PCP and/or pulmonary - Does not wear CPAP.  Chronic back pain with opiate dependence Cervical radiculopathy, myelopathy -Walks with walker and cane.  Resume home regimen.  Outpatient follow-up with PCP and/for pain management  Chronic leg wound - Outpatient follow-up with wound care clinic  Prolonged QTc - Resolved on EKG on 08/06/2024  Obesity class III - Outpatient follow-up   Discharge Instructions  Discharge Instructions     Ambulatory referral to Allergy   Complete by: As directed    Diet - low sodium heart healthy   Complete by: As directed    Diet Carb Modified   Complete by: As directed    Increase activity slowly   Complete by: As directed    No wound  care   Complete by: As directed       Allergies as  of 08/07/2024       Reactions   Coconut Fatty Acid Hives   Ibuprofen Other (See Comments)   Made gastric ulcers worse and stomach upset   Aleve  [naproxen ] Other (See Comments)   DUE TO KIDNEYS   Nsaids Other (See Comments)   Stomach ulcers   Vascepa  [icosapent  Ethyl (epa Ethyl Ester) (fish)] Nausea And Vomiting, Other (See Comments)   Headaches, chest pain, similar to sx of a stroke, hypotension    Other Hives, Rash, Other (See Comments)   Nut Allergy- ALL   Peanut-containing Drug Products Hives, Rash        Medication List     STOP taking these medications    B-complex with vitamin C tablet   docusate sodium  100 MG capsule Commonly known as: COLACE   lisinopril  20 MG tablet Commonly known as: ZESTRIL    methocarbamol  500 MG tablet Commonly known as: ROBAXIN    nystatin  100000 UNIT/ML suspension Commonly known as: MYCOSTATIN    potassium chloride  10 MEQ CR capsule Commonly known as: MICRO-K        TAKE these medications    albuterol  108 (90 Base) MCG/ACT inhaler Commonly known as: VENTOLIN  HFA Inhale 2 puffs into the lungs every 6 (six) hours as needed for wheezing or shortness of breath.   atorvastatin  80 MG tablet Commonly known as: LIPITOR  Take 80 mg by mouth at bedtime.   BIOFREEZE EX Apply 1 application  topically as needed (knee pain).   carvedilol  6.25 MG tablet Commonly known as: COREG  Take 6.25 mg by mouth 2 (two) times daily.   Combivent  Respimat 20-100 MCG/ACT Aers respimat Generic drug: Ipratropium-Albuterol  Inhale 1 puff into the lungs 4 (four) times daily as needed for wheezing or shortness of breath.   Eliquis  5 MG Tabs tablet Generic drug: apixaban  TAKE 1 TABLET BY MOUTH TWICE A DAY   EPINEPHrine  0.3 mg/0.3 mL Soaj injection Commonly known as: EPI-PEN Inject 0.3 mg into the muscle as needed for anaphylaxis.   fenofibrate  145 MG tablet Commonly known as: TRICOR  TAKE 1 TABLET (145 MG TOTAL) BY MOUTH DAILY. What changed: when to  take this   ferrous sulfate  325 (65 FE) MG tablet Take 1 tablet (325 mg total) by mouth daily. What changed: when to take this   FreeStyle Libre 2 Plus Sensor Misc Inject 1 Device into the skin See admin instructions. Place 1 new sensor into the skin every 15 days   furosemide  40 MG tablet Commonly known as: LASIX  Take 40-80 mg by mouth See admin instructions. Take 80 mg by mouth in the morning and 40 mg at night   gabapentin  800 MG tablet Commonly known as: NEURONTIN  Take 800 mg by mouth in the morning and at bedtime.   glipiZIDE  10 MG tablet Commonly known as: GLUCOTROL  Take 10 mg by mouth 2 (two) times daily.   HumuLIN  R U-500 KwikPen 500 UNIT/ML KwikPen Generic drug: insulin  regular human CONCENTRATED Inject 95-120 Units into the skin See admin instructions. Inject 95-120 units into the skin three times a day with meals, per sliding scale   Insulin  Pen Needle 31G X 5 MM Misc Use 1 needle daily to inject insulin  as prescribed   isosorbide  dinitrate 30 MG tablet Commonly known as: ISORDIL  Take 30 mg by mouth 2 (two) times daily.   metolazone 2.5 MG tablet Commonly known as: ZAROXOLYN Take 2.5 mg by mouth in the morning.   nitroGLYCERIN   0.4 MG SL tablet Commonly known as: NITROSTAT  Place 1 tablet (0.4 mg total) under the tongue every 5 (five) minutes as needed for chest pain.   ondansetron  4 MG tablet Commonly known as: ZOFRAN  Take 8 mg by mouth daily as needed for vomiting or nausea.   Oxycodone  HCl 20 MG Tabs Take 20 mg by mouth See admin instructions. Take 20 mg by mouth in the morning and an additional 20 mg up to three times a day as needed for pain What changed: Another medication with the same name was removed. Continue taking this medication, and follow the directions you see here.   pantoprazole  40 MG tablet Commonly known as: PROTONIX  Take 40 mg by mouth daily before breakfast.   potassium chloride  SA 20 MEQ tablet Commonly known as: KLOR-CON  M Take 20  mEq by mouth 2 (two) times daily.   sertraline  50 MG tablet Commonly known as: ZOLOFT  Take 50 mg by mouth every morning.   SSD 1 % cream Generic drug: silver sulfADIAZINE Apply 1 Application topically daily as needed (open wounds).   tiZANidine  4 MG tablet Commonly known as: ZANAFLEX  Take 4 mg by mouth See admin instructions. Take 4 mg by mouth at bedtime and an additional 4 mg up to three times a day as needed for muscle spasms   traZODone  50 MG tablet Commonly known as: DESYREL  Take 1 tablet (50 mg total) by mouth at bedtime.   triamcinolone  cream 0.1 % Commonly known as: KENALOG  Apply 1 Application topically daily as needed (itching).   VITAMIN B12 PO Take 1 tablet by mouth daily.   Vitamin D  (Ergocalciferol ) 1.25 MG (50000 UNIT) Caps capsule Commonly known as: DRISDOL Take 50,000 Units by mouth every Tuesday.        Follow-up Information     Abdul Fine, MD. Schedule an appointment as soon as possible for a visit in 1 week(s).   Specialty: Family Medicine Why: with repeat cbc/bmp Contact information: [address] [place] [address] [phone]                Allergies  Allergen Reactions   Coconut Fatty Acid Hives   Ibuprofen Other (See Comments)    Made gastric ulcers worse and stomach upset   Aleve  [Naproxen ] Other (See Comments)    DUE TO KIDNEYS   Nsaids Other (See Comments)    Stomach ulcers    Vascepa  [Icosapent  Ethyl (Epa Ethyl Ester) (Fish)] Nausea And Vomiting and Other (See Comments)    Headaches, chest pain, similar to sx of a stroke, hypotension    Other Hives, Rash and Other (See Comments)    Nut Allergy- ALL   Peanut-Containing Drug Products Hives and Rash    Consultations: PCCM   Procedures/Studies: DG Chest 2 View Result Date: 08/01/2024 CLINICAL DATA:  Chest pain and shortness of breath. EXAM: CHEST - 2 VIEW COMPARISON:  06/23/2024 FINDINGS: Stable cardiomegaly. Similar central vascular congestion. No focal  airspace disease. No pleural effusion or pneumothorax. No acute osseous abnormality. IMPRESSION: Stable cardiomegaly and central vascular congestion. Electronically Signed   By: Andrea Gasman M.D.   On: 08/01/2024 16:41      Subjective: Patient seen and examined at bedside.  Feels better and wants to go today.  No fever, vomiting, chest pain reported.  Discharge Exam: Vitals:   08/07/24 1002 08/07/24 1145  BP:    Pulse:    Resp:    Temp:  97.9 F (36.6 C)  SpO2: 96%  General: Pt is alert, awake, not in acute distress.  Looks chronically ill and deconditioned.  Currently on supplemental oxygen  via nasal cannula. Cardiovascular: rate controlled, S1/S2 + Respiratory: bilateral decreased breath sounds at bases with scattered crackles Abdominal: Soft, morbidly obese, NT, ND, bowel sounds + Extremities: Bilateral lower extremity edema present; no cyanosis    The results of significant diagnostics from this hospitalization (including imaging, microbiology, ancillary and laboratory) are listed below for reference.     Microbiology: Recent Results (from the past 240 hours)  MRSA Next Gen by PCR, Nasal     Status: None   Collection Time: 08/03/24  2:34 PM   Specimen: Nasal Mucosa; Nasal Swab  Result Value Ref Range Status   MRSA by PCR Next Gen NOT DETECTED NOT DETECTED Final    Comment: (NOTE) The GeneXpert MRSA Assay (FDA approved for NASAL specimens only), is one component of a comprehensive MRSA colonization surveillance program. It is not intended to diagnose MRSA infection nor to guide or monitor treatment for MRSA infections. Test performance is not FDA approved in patients less than 51 years old. Performed at [facility], [address], [place], [place] [address]      Labs: BNP (last 3 results) Recent Labs    08/15/23 0946 09/14/23 0830 06/23/24 0208  BNP 33.4 42.3 13.7   Basic Metabolic Panel: Recent Labs  Lab 08/01/24 1532  08/02/24 0221 08/02/24 0404 08/02/24 1338 08/03/24 0322 08/03/24 1927 08/03/24 2257 08/04/24 2123 08/05/24 0116 08/05/24 0416 08/05/24 0833 08/06/24 1021  NA 138 137  --   --  136 134*   < > 135 136 137 138 140  K 2.3* 2.7*  --    < > 2.9* 3.7   < > 3.5 3.3* 3.5 3.9 3.8  CL 85* 85*  --   --  85* 87*   < > 89* 91* 91* 91* 94*  CO2 36* 35*  --   --  36* 31   < > 31 33* 34* 33* 33*  GLUCOSE 363* 331*  --   --  356* 433*   < > 234* 233* 170* 178* 246*  BUN 18 18  --   --  15 22*   < > 38* 39* 38* 35* 21*  CREATININE 0.93 1.01  --   --  0.84 1.60*   < > 2.11* 1.92* 1.65* 1.54* 0.81  CALCIUM  9.4 9.2  --   --  9.8 9.6   < > 9.2 9.0 9.1 9.4 9.4  MG 1.4* 2.0  --   --  1.9 2.2  --  2.3  --   --   --   --   PHOS  --   --  3.4  --   --   --   --   --   --   --   --   --    < > = values in this interval not displayed.   Liver Function Tests: No results for input(s): AST, ALT, ALKPHOS, BILITOT, PROT, ALBUMIN  in the last 168 hours. No results for input(s): LIPASE, AMYLASE in the last 168 hours. No results for input(s): AMMONIA in the last 168 hours. CBC: Recent Labs  Lab 08/01/24 1532 08/02/24 0221 08/04/24 0832 08/07/24 1207  WBC 11.0* 10.7* 16.7* 9.7  NEUTROABS  --   --   --  6.6  HGB 12.9* 12.8* 12.2* 12.8*  HCT 37.6* 39.6 37.2* 39.7  MCV 93.8 95.4 96.9 97.8  PLT 213  223 222 222   Cardiac Enzymes: No results for input(s): CKTOTAL, CKMB, CKMBINDEX, TROPONINI in the last 168 hours. BNP: Invalid input(s): POCBNP CBG: Recent Labs  Lab 08/06/24 1156 08/06/24 1643 08/06/24 2123 08/07/24 0740 08/07/24 1141  GLUCAP 223* 187* 160* 162* 218*   D-Dimer No results for input(s): DDIMER in the last 72 hours. Hgb A1c No results for input(s): HGBA1C in the last 72 hours. Lipid Profile No results for input(s): CHOL, HDL, LDLCALC, TRIG, CHOLHDL, LDLDIRECT in the last 72 hours. Thyroid  function studies No results for input(s): TSH,  T4TOTAL, T3FREE, THYROIDAB in the last 72 hours.  Invalid input(s): FREET3 Anemia work up No results for input(s): VITAMINB12, FOLATE, FERRITIN, TIBC, IRON, RETICCTPCT in the last 72 hours. Urinalysis    Component Value Date/Time   COLORURINE STRAW (A) 12/09/2023 1522   APPEARANCEUR CLEAR 12/09/2023 1522   LABSPEC 1.009 12/09/2023 1522   PHURINE 5.0 12/09/2023 1522   GLUCOSEU NEGATIVE 12/09/2023 1522   HGBUR NEGATIVE 12/09/2023 1522   BILIRUBINUR NEGATIVE 12/09/2023 1522   KETONESUR NEGATIVE 12/09/2023 1522   PROTEINUR NEGATIVE 12/09/2023 1522   UROBILINOGEN 0.2 05/27/2014 2312   NITRITE NEGATIVE 12/09/2023 1522   LEUKOCYTESUR NEGATIVE 12/09/2023 1522   Sepsis Labs Recent Labs  Lab 08/01/24 1532 08/02/24 0221 08/04/24 0832 08/07/24 1207  WBC 11.0* 10.7* 16.7* 9.7   Microbiology Recent Results (from the past 240 hours)  MRSA Next Gen by PCR, Nasal     Status: None   Collection Time: 08/03/24  2:34 PM   Specimen: Nasal Mucosa; Nasal Swab  Result Value Ref Range Status   MRSA by PCR Next Gen NOT DETECTED NOT DETECTED Final    Comment: (NOTE) The GeneXpert MRSA Assay (FDA approved for NASAL specimens only), is one component of a comprehensive MRSA colonization surveillance program. It is not intended to diagnose MRSA infection nor to guide or monitor treatment for MRSA infections. Test performance is not FDA approved in patients less than 58 years old. Performed at [facility], [address], [place], [place] [address]      Time coordinating discharge: 35 minutes  SIGNED:   Sophie Mao, MD  [facility] 08/07/2024, 12:50 PM

## 2024-08-07 NOTE — Plan of Care (Signed)
  Problem: Skin Integrity: Goal: Risk for impaired skin integrity will decrease Outcome: Progressing   Problem: Tissue Perfusion: Goal: Adequacy of tissue perfusion will improve Outcome: Progressing   Problem: Clinical Measurements: Goal: Will remain free from infection Outcome: Progressing Goal: Cardiovascular complication will be avoided Outcome: Progressing   Problem: Activity: Goal: Risk for activity intolerance will decrease Outcome: Progressing   Problem: Nutritional: Goal: Progress toward achieving an optimal weight will improve Outcome: Not Progressing   Problem: Nutritional: Goal: Maintenance of adequate nutrition will improve Outcome: Adequate for Discharge   Problem: Clinical Measurements: Goal: Respiratory complications will improve Outcome: Adequate for Discharge

## 2024-08-07 NOTE — Progress Notes (Signed)
 Patient discharge instructions reviewed with patient, no questions/concerns at this time. PIV removed, all personal belongings accounted for, patient transferred via wheelchair by Lexi, NT without complication to patient's friend's POV.

## 2024-08-08 ENCOUNTER — Encounter (HOSPITAL_BASED_OUTPATIENT_CLINIC_OR_DEPARTMENT_OTHER): Admitting: Internal Medicine

## 2024-08-08 DIAGNOSIS — L97822 Non-pressure chronic ulcer of other part of left lower leg with fat layer exposed: Secondary | ICD-10-CM

## 2024-08-08 DIAGNOSIS — E11622 Type 2 diabetes mellitus with other skin ulcer: Secondary | ICD-10-CM | POA: Diagnosis not present

## 2024-08-08 DIAGNOSIS — I87312 Chronic venous hypertension (idiopathic) with ulcer of left lower extremity: Secondary | ICD-10-CM | POA: Diagnosis not present

## 2024-08-08 DIAGNOSIS — I890 Lymphedema, not elsewhere classified: Secondary | ICD-10-CM

## 2024-08-15 ENCOUNTER — Encounter (HOSPITAL_BASED_OUTPATIENT_CLINIC_OR_DEPARTMENT_OTHER): Admitting: Internal Medicine

## 2024-08-15 DIAGNOSIS — E11622 Type 2 diabetes mellitus with other skin ulcer: Secondary | ICD-10-CM

## 2024-08-15 DIAGNOSIS — L97822 Non-pressure chronic ulcer of other part of left lower leg with fat layer exposed: Secondary | ICD-10-CM

## 2024-08-15 DIAGNOSIS — I890 Lymphedema, not elsewhere classified: Secondary | ICD-10-CM

## 2024-08-15 DIAGNOSIS — I87312 Chronic venous hypertension (idiopathic) with ulcer of left lower extremity: Secondary | ICD-10-CM | POA: Diagnosis not present

## 2024-08-29 ENCOUNTER — Encounter: Payer: Self-pay | Admitting: Podiatry

## 2024-08-29 ENCOUNTER — Ambulatory Visit: Admitting: Podiatry

## 2024-08-29 DIAGNOSIS — B351 Tinea unguium: Secondary | ICD-10-CM

## 2024-08-29 DIAGNOSIS — E1142 Type 2 diabetes mellitus with diabetic polyneuropathy: Secondary | ICD-10-CM | POA: Diagnosis not present

## 2024-08-29 DIAGNOSIS — M79674 Pain in right toe(s): Secondary | ICD-10-CM

## 2024-08-29 DIAGNOSIS — M79675 Pain in left toe(s): Secondary | ICD-10-CM

## 2024-08-29 NOTE — Progress Notes (Signed)
This patient presents  to my office for at risk foot care.  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  This patient presents for at risk foot care today.  General Appearance  Alert, conversant and in no acute stress.  Vascular  Dorsalis pedis   pulses are absent bilaterally. Posterior tibial pulses are absent due to swelling. Capillary return is within normal limits  bilaterally. Temperature is within normal limits  bilaterally.  Venous stasis  B/L.  Neurologic  Senn-Weinstein monofilament wire test within normal limits  bilaterally. Muscle power within normal limits bilaterally.  Nails Thick disfigured discolored nails with subungual debris  from hallux to fifth toes bilaterally. No evidence of bacterial infection or drainage bilaterally.  Orthopedic  No limitations of motion  feet .  No crepitus or effusions noted.  No bony pathology or digital deformities noted.  Skin  dry skin with no porokeratosis noted bilaterally.  No signs of infections or ulcers noted.     Onychomycosis  Pain in right toes  Pain in left toes  Consent was obtained for treatment procedures.   Mechanical debridement of nails 1-5  bilaterally performed with a nail nipper.  Filed with dremel without incident.    Return office visit    3 months                 Told patient to return for periodic foot care and evaluation due to potential at risk complications.   Gardiner Barefoot DPM

## 2024-09-08 NOTE — Progress Notes (Unsigned)
 New Patient Note  RE: [name] MRN: [id number] DOB: 08-30-1961 Date of Office Visit: 09/09/2024  Consult requested by: Cheryle Page, MD Primary care provider: Abdul Fine, MD (Inactive)  Chief Complaint: No chief complaint on file.  History of Present Illness: I had the pleasure of seeing [name] for initial evaluation at the [facility] on 09/09/2024. He is a 63 y.o. male, who is referred here by Cheryle Page, MD for the evaluation of allergic reaction.  Discussed the use of AI scribe software for clinical note transcription with the patient, who gave verbal consent to proceed.  History of Present Illness             08/01/2024 hospitalization: 63 year old man PMH including CAD, chronic diastolic CHF, chronic hypoxic respiratory failure on 4 L, he was sent to the emergency department by PCP for hypokalemia. Admitted for severe hypokalemia, hypomagnesemia, electrolytes repleted but developed sudden stridor and hypotension consistent with anaphylaxis requiring transfer to the ICU. Cause of anaphylaxis unclear, no apparent new medications or foods. Subsequently developed DKA which resolved with standard treatment. AKI resolved with fluids.  Subsequently, patient remains medically stable and patient feels okay to go home today.  He will be discharged home today with outpatient follow-up with PCP.   Anaphylactic shock: Resolved - Sudden onset on 08/03/2024, unclear etiology.  Developed stridor and marked hypotension, rapidly responded to Solu-Medrol , H2 blocker and epinephrine .  No apparent few new foods, no new medications.  Lisinopril  has been discontinued. - Clinically resolved.  Will need EpiPen  on discharge.  Apparently has EpiPen  at home.  Will still send a new prescription for EpiPen .  Recommend outpatient evaluation and follow-up by allergy specialist  Assessment and Plan: Gearald is a 63 y.o. male with: ***  Assessment and Plan                No follow-ups on file.  No orders of the defined types were placed in this encounter.  Lab Orders  No laboratory test(s) ordered today    Other allergy screening: Asthma: {Blank single:19197::yes,no} Rhino conjunctivitis: {Blank single:19197::yes,no} Food allergy: {Blank single:19197::yes,no} Medication allergy: {Blank single:19197::yes,no} Hymenoptera allergy: {Blank single:19197::yes,no} Urticaria: {Blank single:19197::yes,no} Eczema:{Blank single:19197::yes,no} History of recurrent infections suggestive of immunodeficency: {Blank single:19197::yes,no}  Diagnostics: Spirometry:  Tracings reviewed. His effort: {Blank single:19197::Good reproducible efforts.,It was hard to get consistent efforts and there is a question as to whether this reflects a maximal maneuver.,Poor effort, data can not be interpreted.} FVC: ***L FEV1: ***L, ***% predicted FEV1/FVC ratio: ***% Interpretation: {Blank single:19197::Spirometry consistent with mild obstructive disease,Spirometry consistent with moderate obstructive disease,Spirometry consistent with severe obstructive disease,Spirometry consistent with possible restrictive disease,Spirometry consistent with mixed obstructive and restrictive disease,Spirometry uninterpretable due to technique,Spirometry consistent with normal pattern,No overt abnormalities noted given today's efforts}.  Please see scanned spirometry results for details.  Skin Testing: {Blank single:19197::Select foods,Environmental allergy panel,Environmental allergy panel and select foods,Food allergy panel,None,Deferred due to recent antihistamines use}. *** Results discussed with patient/family.   Past Medical History: Patient Active Problem List   Diagnosis Date Noted  . Anaphylactic shock 08/04/2024  . Prolonged QT interval 08/02/2024  . Blood in the stool 07/09/2024  . Cervical myelopathy  (HCC) 03/13/2024  . Bronchitis 07/06/2023  . Chronic respiratory failure with hypoxia (HCC) 07/06/2023  . Pulmonary embolism (HCC) 07/06/2023  . AKI (acute kidney injury) 05/14/2023  . Paresthesias 11/22/2022  . Hypotension 11/21/2022  . Acute kidney injury superimposed on chronic kidney disease 11/21/2022  . GI bleeding  11/10/2022  . History of pulmonary embolism 11/10/2022  . Right-sided Bell's palsy 10/11/2022  . Class 3 obesity (HCC) 08/16/2022  . Diplopia 08/15/2022  . Carpal tunnel syndrome on both sides 03/14/2022  . Diabetic ulcer of lower leg (HCC) 06/26/2021  . Lumbar radiculitis 03/30/2021  . Morbid obesity (HCC) 01/15/2021  . History of colonic polyps 01/15/2021  . Pain due to onychomycosis of toenails of both feet 01/15/2021  . Epistaxis, recurrent 10/21/2020  . Laceration of nose 10/21/2020  . OSA (obstructive sleep apnea)   . Bilateral carotid artery stenosis 07/28/2020  . Bilateral lower extremity edema 07/28/2020  . CHF (congestive heart failure) (HCC) 07/28/2020  . DDD (degenerative disc disease), lumbar 07/28/2020  . Diabetic peripheral neuropathy (HCC) 07/28/2020  . Fatty liver 07/28/2020  . Irritable bowel syndrome with diarrhea 07/28/2020  . Osteoarthritis of right hip 07/28/2020  . Mixed diabetic hyperlipidemia associated with type 2 diabetes mellitus (HCC) 04/27/2020  . Rotator cuff arthropathy 10/30/2019  . Cervical radiculopathy 09/24/2019  . Trochanteric bursitis of right hip 09/24/2019  . Chronic diastolic CHF (congestive heart failure) (HCC) 08/23/2019  . ARF (acute renal failure) 08/01/2019  . Hyperkalemia 08/01/2019  . Uncontrolled type 2 diabetes mellitus with hyperglycemia (HCC) 08/01/2019  . DKA (diabetic ketoacidosis) (HCC) 05/05/2019  . Trochanteric bursitis 02/11/2019  . Long-term insulin  use (HCC) 01/25/2019  . Neck pain 10/09/2018  . Left arm weakness 10/09/2018  . Decreased sensation of lower extremity 09/12/2018  . B12 deficiency  08/10/2017  . Persistent headaches 08/09/2017  . GERD (gastroesophageal reflux disease) 06/29/2017  . Left arm numbness   . History of CVA (cerebrovascular accident) 06/17/2017  . TIA (transient ischemic attack) 06/17/2017  . Degenerative joint disease involving multiple joints 04/09/2017  . Foraminal stenosis of lumbar region 01/19/2017  . Chronic back pain 12/29/2016  . Depression 12/15/2016  . Vitamin D  deficiency 12/09/2016  . Right hip pain 12/07/2016  . Hypokalemia 12/07/2016  . Type 2 diabetes mellitus with vascular disease (HCC) 05/31/2016  . Normocytic normochromic anemia 05/31/2016  . Chest pain 05/31/2016  . Coronary artery disease involving native coronary artery of native heart without angina pectoris 01/06/2016  . Chronic pancreatitis (HCC) 03/21/2014  . Edema 03/21/2014  . Chest pain, non-cardiac 01/29/2014  . DM2 (diabetes mellitus, type 2) (HCC) 01/29/2014  . Obesity, Class III, BMI 40-49.9 (morbid obesity) (HCC) 01/29/2014  . Snoring 01/29/2014  . Dyslipidemia 01/29/2014  . HTN (hypertension) 01/29/2014  . Abnormal nuclear stress test 01/29/2014  . Benign essential hypertension 08/15/2013   Past Medical History:  Diagnosis Date  . Anemia   . Arthritis   . Back pain   . CAD (coronary artery disease)    a. s/p DES to LAD in 05/2016  . Cervical radiculopathy   . Chronic diastolic CHF (congestive heart failure) (HCC)   . Chronic pain   . Deep vein thrombosis (HCC) 01/06/2016   Formatting of this note might be different from the original.  Formatting of this note might be different from the original. Last Assessment & Plan: Management per primary care. Last Assessment & Plan: Management per primary care.  Formatting of this note might be different from the original. Last Assessment & Plan: Management per primary care. Last Assessment & Plan: Management per primary care.  . Depression   . DVT (deep venous thrombosis) (HCC)   . Dyspnea   . GERD (gastroesophageal  reflux disease)   . Hematemesis   . Hepatic steatosis   . Hyperlipidemia   .  Hypertension   . IBS (irritable bowel syndrome)   . Morbid obesity (HCC)   . Myocardial infarction (HCC)   . OSA (obstructive sleep apnea)    oxygen  4L/[place] at night and PRN  . Pancreatitis   . PE (pulmonary thromboembolism) (HCC)   . Peripheral neuropathy   . PUD (peptic ulcer disease)   . Pulmonary embolism (HCC) 07/15/2021  . Renal disorder   . Stroke [facility])    a. ?details unclear - not seen on imaging when he was admitted in 05/2017 for TIA symptoms which were felt due to cervical radiculopathy.  . Thoracic aortic ectasia    a. 4.3cm ectatic ascending thoracic aorta by CT 06/2017.   . Type 2 diabetes mellitus (HCC)    Past Surgical History: Past Surgical History:  Procedure Laterality Date  . ANTERIOR CERVICAL CORPECTOMY N/A 03/13/2024   Procedure: ANTERIOR CERVICAL DECOMPRESSION AND FUSION WITH PLATING CERVICAL FOUR-CERVICAL FIVE/CERVICAL FIVE-CERVICAL SIX;  Surgeon: Debby Dorn MATSU, MD;  Location[facility];  Service: Neurosurgery;  Laterality: N/A;  . BIOPSY OF SKIN SUBCUTANEOUS TISSUE AND/OR MUCOUS MEMBRANE  07/09/2024   Procedure: BIOPSY, SKIN, SUBCUTANEOUS TISSUE, OR MUCOUS MEMBRANE;  Surgeon: Dianna Specking, MD;  Location: [facility] ENDOSCOPY;  Service: Gastroenterology;;  . CARDIAC CATHETERIZATION N/A 05/31/2016   Procedure: Left Heart Cath and Coronary Angiography;  Surgeon: Peter M Jordan, MD;  Location: [facility] INVASIVE CV LAB;  Service: Cardiovascular;  Laterality: N/A;  . CARDIAC CATHETERIZATION N/A 05/31/2016   Procedure: Intravascular Pressure Wire/FFR Study;  Surgeon: Peter M Jordan, MD;  Location: [facility] INVASIVE CV LAB;  Service: Cardiovascular;  Laterality: N/A;  . CARDIAC CATHETERIZATION N/A 05/31/2016   Procedure: Coronary Stent Intervention;  Surgeon: Peter M Jordan, MD;  Location: [facility] INVASIVE CV LAB;  Service: Cardiovascular;  Laterality: N/A;  . COLONOSCOPY N/A 07/17/2021   Procedure: COLONOSCOPY;   Surgeon: Dianna Specking, MD;  Location: [facility] ENDOSCOPY;  Service: Endoscopy;  Laterality: N/A;  . COLONOSCOPY N/A 07/09/2024   Procedure: COLONOSCOPY;  Surgeon: Dianna Specking, MD;  Location: [facility] ENDOSCOPY;  Service: Gastroenterology;  Laterality: N/A;  . COLONOSCOPY WITH PROPOFOL  N/A 04/29/2020   Procedure: COLONOSCOPY WITH PROPOFOL ;  Surgeon: Dianna Specking, MD;  Location: [facility] ENDOSCOPY;  Service: Endoscopy;  Laterality: N/A;  . ESOPHAGOGASTRODUODENOSCOPY N/A 04/29/2020   Procedure: ESOPHAGOGASTRODUODENOSCOPY (EGD);  Surgeon: Dianna Specking, MD;  Location: [facility] ENDOSCOPY;  Service: Endoscopy;  Laterality: N/A;  . ESOPHAGOGASTRODUODENOSCOPY N/A 07/09/2024   Procedure: EGD (ESOPHAGOGASTRODUODENOSCOPY);  Surgeon: Dianna Specking, MD;  Location: [facility] ENDOSCOPY;  Service: Gastroenterology;  Laterality: N/A;  . ESOPHAGOGASTRODUODENOSCOPY (EGD) WITH PROPOFOL  N/A 07/17/2021   Procedure: ESOPHAGOGASTRODUODENOSCOPY (EGD) WITH PROPOFOL ;  Surgeon: Dianna Specking, MD;  Location: [facility] ENDOSCOPY;  Service: Endoscopy;  Laterality: N/A;  . GIVENS CAPSULE STUDY N/A 11/10/2022   Procedure: GIVENS CAPSULE STUDY;  Surgeon: Elicia Claw, MD;  Location: [facility] ENDOSCOPY;  Service: Gastroenterology;  Laterality: N/A;  . LEFT HEART CATH AND CORONARY ANGIOGRAPHY N/A 12/08/2017   Procedure: LEFT HEART CATH AND CORONARY ANGIOGRAPHY;  Surgeon: Anner Alm ORN, MD;  Location: [facility] INVASIVE CV LAB;  Service: Cardiovascular;  Laterality: N/A;  . LEFT HEART CATHETERIZATION WITH CORONARY ANGIOGRAM N/A 02/03/2014   Procedure: LEFT HEART CATHETERIZATION WITH CORONARY ANGIOGRAM;  Surgeon: Vinie KYM Maxcy, MD;  Location: [facility] CATH LAB;  Service: Cardiovascular;  Laterality: N/A;  . left leg stent     . POLYPECTOMY  04/29/2020   Procedure: POLYPECTOMY;  Surgeon: Dianna Specking, MD;  Location: [facility] ENDOSCOPY;  Service: Endoscopy;;  . POLYPECTOMY  07/17/2021   Procedure:  POLYPECTOMY;  Surgeon: Dianna Specking, MD;  Location: [facility]  ENDOSCOPY;  Service: Endoscopy;;   Medication List:  Current Outpatient Medications  Medication Sig Dispense Refill  . albuterol  (VENTOLIN  HFA) 108 (90 Base) MCG/ACT inhaler Inhale 2 puffs into the lungs every 6 (six) hours as needed for wheezing or shortness of breath.    . atorvastatin  (LIPITOR ) 80 MG tablet Take 80 mg by mouth at bedtime.    . carvedilol  (COREG ) 6.25 MG tablet Take 6.25 mg by mouth 2 (two) times daily.    . COMBIVENT  RESPIMAT 20-100 MCG/ACT AERS respimat Inhale 1 puff into the lungs 4 (four) times daily as needed for wheezing or shortness of breath.    . Continuous Glucose Sensor (FREESTYLE LIBRE 2 PLUS SENSOR) MISC Inject 1 Device into the skin See admin instructions. Place 1 new sensor into the skin every 15 days    . Cyanocobalamin  (VITAMIN B12 PO) Take 1 tablet by mouth daily.    . ELIQUIS  5 MG TABS tablet TAKE 1 TABLET BY MOUTH TWICE A DAY 60 tablet 2  . EPINEPHrine  0.3 mg/0.3 mL IJ SOAJ injection Inject 0.3 mg into the muscle as needed for anaphylaxis. 1 each 0  . fenofibrate  (TRICOR ) 145 MG tablet TAKE 1 TABLET (145 MG TOTAL) BY MOUTH DAILY. (Patient taking differently: Take 145 mg by mouth at bedtime.) 90 tablet 3  . ferrous sulfate  325 (65 FE) MG tablet Take 1 tablet (325 mg total) by mouth daily. (Patient taking differently: Take 325 mg by mouth daily with breakfast.) 30 tablet 0  . furosemide  (LASIX ) 40 MG tablet Take 40-80 mg by mouth See admin instructions. Take 80 mg by mouth in the morning and 40 mg at night    . gabapentin  (NEURONTIN ) 800 MG tablet Take 800 mg by mouth in the morning and at bedtime.    . glipiZIDE  (GLUCOTROL ) 10 MG tablet Take 10 mg by mouth 2 (two) times daily.  4  . HUMULIN  R U-500 KWIKPEN 500 UNIT/ML kwikpen Inject 95-120 Units into the skin See admin instructions. Inject 95-120 units into the skin three times a day with meals, per sliding scale    . Insulin  Pen Needle 31G X 5 MM MISC Use 1 needle daily to inject insulin  as prescribed 100  each 2  . isosorbide  dinitrate (ISORDIL ) 30 MG tablet Take 30 mg by mouth 2 (two) times daily.    . Menthol , Topical Analgesic, (BIOFREEZE EX) Apply 1 application  topically as needed (knee pain).    SABRA metolazone (ZAROXOLYN) 2.5 MG tablet Take 2.5 mg by mouth in the morning.    . nitroGLYCERIN  (NITROSTAT ) 0.4 MG SL tablet Place 1 tablet (0.4 mg total) under the tongue every 5 (five) minutes as needed for chest pain. 100 tablet 1  . ondansetron  (ZOFRAN ) 4 MG tablet Take 8 mg by mouth daily as needed for vomiting or nausea.    . Oxycodone  HCl 20 MG TABS Take 20 mg by mouth See admin instructions. Take 20 mg by mouth in the morning and an additional 20 mg up to three times a day as needed for pain    . pantoprazole  (PROTONIX ) 40 MG tablet Take 40 mg by mouth daily before breakfast.    . potassium chloride  SA (KLOR-CON  M) 20 MEQ tablet Take 20 mEq by mouth 2 (two) times daily.    . sertraline  (ZOLOFT ) 50 MG tablet Take 50 mg by mouth every morning.    SABRA SSD 1 % cream Apply 1 Application topically  daily as needed (open wounds).    . tiZANidine  (ZANAFLEX ) 4 MG tablet Take 4 mg by mouth See admin instructions. Take 4 mg by mouth at bedtime and an additional 4 mg up to three times a day as needed for muscle spasms    . traZODone  (DESYREL ) 50 MG tablet Take 1 tablet (50 mg total) by mouth at bedtime. 30 tablet 11  . triamcinolone  cream (KENALOG ) 0.1 % Apply 1 Application topically daily as needed (itching).    . Vitamin D , Ergocalciferol , (DRISDOL) 1.25 MG (50000 UT) CAPS capsule Take 50,000 Units by mouth every Tuesday.     No current facility-administered medications for this visit.   Allergies: Allergies  Allergen Reactions  . Coconut Fatty Acid Hives  . Ibuprofen Other (See Comments)    Made gastric ulcers worse and stomach upset  . Aleve  [Naproxen ] Other (See Comments)    DUE TO KIDNEYS  . Nsaids Other (See Comments)    Stomach ulcers   . Vascepa  [Icosapent  Ethyl (Epa Ethyl Ester) (Fish)]  Nausea And Vomiting and Other (See Comments)    Headaches, chest pain, similar to sx of a stroke, hypotension   . Other Hives, Rash and Other (See Comments)    Nut Allergy- ALL  . Peanut-Containing Drug Products Hives and Rash   Social History: Social History   Socioeconomic History  . Marital status: Single    Spouse name: Not on file  . Number of children: 3  . Years of education: 2  . Highest education level: Not on file  Occupational History  . Occupation: Disabled  Tobacco Use  . Smoking status: Never    Passive exposure: Past  . Smokeless tobacco: Never  Vaping Use  . Vaping status: Never Used  Substance and Sexual Activity  . Alcohol  use: No  . Drug use: Not Currently    Types: Marijuana  . Sexual activity: Not Currently    Partners: Female    Comment: SINGLE  Other Topics Concern  . Not on file  Social History Narrative   Independent and ambulatory with cane.   Lives at home alone.   Right-handed.   1-2 cups caffeine per day.   Social Drivers of Corporate Investment Banker Strain: Not on File (02/10/2022)   Received from [employer]   . Financial Resource Strain: 0  Food Insecurity: No Food Insecurity (08/01/2024)   Hunger Vital Sign   . Worried About Programme Researcher, Broadcasting/film/video in the Last Year: Never true   . Ran Out of Food in the Last Year: Never true  Transportation Needs: No Transportation Needs (08/01/2024)   PRAPARE - Transportation   . Lack of Transportation (Medical): No   . Lack of Transportation (Non-Medical): No  Physical Activity: Not on File (02/10/2022)   Received from [facility]   Physical Activity   . Physical Activity: 0  Stress: Not on File (02/10/2022)   [facility]   Stress   . Stress: 0  Social Connections: Not on File (07/06/2023)   Received from [employer]   . Connectedness: 0   Lives in a ***. Smoking: *** Occupation: ***  Environmental HistorySurveyor, Minerals in the house: Patent Attorney in the family room: {Blank single:19197::yes,no} Carpet in the bedroom: {Blank single:19197::yes,no} Heating: {Blank single:19197::electric,gas,heat pump} Cooling: {Blank single:19197::central,window,heat pump} Pet: {Blank single:19197::yes ***,no}  Family History: Family History  Problem Relation Age of Onset  . Cancer Father   . Hypertension  Mother   . Diabetes Mother   . Breast cancer Mother   . Hypertension Brother   . Diabetes Brother   . Hypertension Sister   . Diabetes Sister    Problem                               Relation Asthma                                   *** Eczema                                *** Food allergy                          *** Allergic rhino conjunctivitis     ***  Review of Systems  Constitutional:  Negative for appetite change, chills, fever and unexpected weight change.  HENT:  Negative for congestion and rhinorrhea.   Eyes:  Negative for itching.  Respiratory:  Negative for cough, chest tightness, shortness of breath and wheezing.   Cardiovascular:  Negative for chest pain.  Gastrointestinal:  Negative for abdominal pain.  Genitourinary:  Negative for difficulty urinating.  Skin:  Negative for rash.  Neurological:  Negative for headaches.    Objective: There were no vitals taken for this visit. There is no height or weight on file to calculate BMI. Physical Exam Vitals and nursing note reviewed.  Constitutional:      Appearance: Normal appearance. He is well-developed.  HENT:     Head: Normocephalic and atraumatic.     Right Ear: Tympanic membrane and external ear normal.     Left Ear: Tympanic membrane and external ear normal.     Nose: Nose normal.     Mouth/Throat:     Mouth: Mucous membranes are moist.     Pharynx: Oropharynx is clear.  Eyes:     Conjunctiva/sclera: Conjunctivae normal.  Cardiovascular:     Rate and Rhythm: Normal rate and regular rhythm.     Heart  sounds: Normal heart sounds. No murmur heard.    No friction rub. No gallop.  Pulmonary:     Effort: Pulmonary effort is normal.     Breath sounds: Normal breath sounds. No wheezing, rhonchi or rales.  Musculoskeletal:     Cervical back: Neck supple.  Skin:    General: Skin is warm.     Findings: No rash.  Neurological:     Mental Status: He is alert and oriented to person, place, and time.  Psychiatric:        Behavior: Behavior normal.   The plan was reviewed with the patient/family, and all questions/concerned were addressed.  It was my pleasure to see Grabiel today and participate in his care. Please feel free to contact me with any questions or concerns.  Sincerely,  Orlan Cramp, DO Allergy & Immunology  [facility]  [place] office: [phone] [facility] office: [phone]

## 2024-09-09 ENCOUNTER — Ambulatory Visit: Admitting: Allergy

## 2024-09-09 ENCOUNTER — Other Ambulatory Visit: Payer: Self-pay

## 2024-09-09 ENCOUNTER — Encounter: Payer: Self-pay | Admitting: Allergy

## 2024-09-09 VITALS — BP 132/78 | HR 83 | Temp 98.3°F | Resp 20 | Ht 70.0 in | Wt 358.0 lb

## 2024-09-09 DIAGNOSIS — T7840XD Allergy, unspecified, subsequent encounter: Secondary | ICD-10-CM

## 2024-09-09 DIAGNOSIS — T7800XA Anaphylactic reaction due to unspecified food, initial encounter: Secondary | ICD-10-CM

## 2024-09-09 DIAGNOSIS — T7800XD Anaphylactic reaction due to unspecified food, subsequent encounter: Secondary | ICD-10-CM | POA: Diagnosis not present

## 2024-09-09 DIAGNOSIS — T7840XA Allergy, unspecified, initial encounter: Secondary | ICD-10-CM

## 2024-09-09 NOTE — Patient Instructions (Addendum)
 POSSIBLE ALLERGIC REACTION I'm not sure what caused your episode.  If it happens again let us  know. Avoid coconut, peanuts and tree nuts for now.  For mild symptoms you can take over the counter antihistamines (zyrtec  10mg  to 20mg ) and monitor symptoms closely.  If symptoms worsen or if you have severe symptoms including breathing issues, throat closure, significant swelling, whole body hives, severe diarrhea and vomiting, lightheadedness then use epinephrine  and seek immediate medical care afterwards. Emergency action plan given. Get bloodwork We are ordering labs, so please allow 1-2 weeks for the results to come back. With the newly implemented Cures Act, the labs might be visible to you at the same time that they become visible to me. However, I will not address the results until all of the results are back, so please be patient.  In the meantime, continue recommendations in your patient instructions, including avoidance measures (if applicable), until you hear from me.  Follow up as needed depending on lab results.

## 2024-09-11 LAB — ALLERGY PROFILE, FOOD

## 2024-09-12 LAB — SEDIMENTATION RATE: Sed Rate: 69 mm/h — ABNORMAL HIGH (ref 0–30)

## 2024-09-12 LAB — COMPREHENSIVE METABOLIC PANEL WITH GFR
ALT: 21 IU/L (ref 0–44)
AST: 28 IU/L (ref 0–40)
Albumin: 4.3 g/dL (ref 3.8–4.9)
Alkaline Phosphatase: 67 IU/L (ref 47–123)
BUN/Creatinine Ratio: 14 (ref 10–24)
BUN: 14 mg/dL (ref 8–27)
Bilirubin Total: 0.6 mg/dL (ref 0.0–1.2)
CO2: 24 mmol/L (ref 20–29)
Calcium: 9.5 mg/dL (ref 8.6–10.2)
Chloride: 95 mmol/L — ABNORMAL LOW (ref 96–106)
Creatinine, Ser: 1 mg/dL (ref 0.76–1.27)
Globulin, Total: 3 g/dL (ref 1.5–4.5)
Glucose: 211 mg/dL — ABNORMAL HIGH (ref 70–99)
Potassium: 3.4 mmol/L — ABNORMAL LOW (ref 3.5–5.2)
Sodium: 141 mmol/L (ref 134–144)
Total Protein: 7.3 g/dL (ref 6.0–8.5)
eGFR: 86 mL/min/1.73 (ref >59)

## 2024-09-12 LAB — CBC WITH DIFFERENTIAL/PLATELET
Basophils Absolute: 0 x10E3/uL (ref 0.0–0.2)
Basos: 0 %
EOS (ABSOLUTE): 0.1 x10E3/uL (ref 0.0–0.4)
Eos: 1 %
Hematocrit: 40.4 % (ref 37.5–51.0)
Hemoglobin: 13.6 g/dL (ref 13.0–17.7)
Immature Grans (Abs): 0.1 x10E3/uL (ref 0.0–0.1)
Immature Granulocytes: 1 %
Lymphocytes Absolute: 2.4 x10E3/uL (ref 0.7–3.1)
Lymphs: 23 %
MCH: 32.3 pg (ref 26.6–33.0)
MCHC: 33.7 g/dL (ref 31.5–35.7)
MCV: 96 fL (ref 79–97)
Monocytes Absolute: 0.7 x10E3/uL (ref 0.1–0.9)
Monocytes: 7 %
Neutrophils Absolute: 7.1 x10E3/uL — ABNORMAL HIGH (ref 1.4–7.0)
Neutrophils: 68 %
Platelets: 241 x10E3/uL (ref 150–450)
RBC: 4.21 x10E6/uL (ref 4.14–5.80)
RDW: 14.3 % (ref 11.6–15.4)
WBC: 10.3 x10E3/uL (ref 3.4–10.8)

## 2024-09-12 LAB — ALPHA-GAL PANEL
Allergen Lamb IgE: <0.1 kU/L
Beef IgE: <0.1 kU/L
IgE (Immunoglobulin E), Serum: 32 [IU]/mL (ref 6–495)
O215-IgE Alpha-Gal: <0.1 kU/L
Pork IgE: <0.1 kU/L

## 2024-09-12 LAB — ALLERGY PROFILE, FOOD
Allergen Salmon IgE: <0.1 kU/L
Codfish IgE: <0.1 kU/L
F001-IgE Egg White: <0.1 kU/L
F002-IgE Milk: <0.1 kU/L
F004-IgE Wheat: <0.1 kU/L
F010-IgE Sesame Seed: <0.1 kU/L
F017-IgE Hazelnut (Filbert): <0.1 kU/L
F018-IgE Brazil Nut: <0.1 kU/L
F020-IgE Almond: <0.1 kU/L
F202-IgE Cashew Nut: <0.1 kU/L
F256-IgE Walnut: <0.1 kU/L
F416-IgE Tri a 19(w-5 gliadin): <0.1 kU/L
Peanut, IgE: <0.1 kU/L
Scallop IgE: <0.1 kU/L
Shrimp IgE: <0.1 kU/L
Soybean IgE: <0.1 kU/L
Tuna: <0.1 kU/L

## 2024-09-12 LAB — ANTINUCLEAR ANTIBODIES, IFA

## 2024-09-12 LAB — THYROID CASCADE PROFILE: TSH: 1.22 u[IU]/mL (ref 0.450–4.500)

## 2024-09-12 LAB — IGE NUT PROF. W/COMPONENT RFLX
F203-IgE Pistachio Nut: <0.1 kU/L
Macadamia Nut, IgE: <0.1 kU/L
Pecan Nut IgE: <0.1 kU/L

## 2024-09-12 LAB — TRYPTASE: Tryptase: 6.8 ug/L (ref 2.2–13.2)

## 2024-09-12 LAB — ALLERGEN COCONUT IGE: Allergen Coconut IgE: <0.1 kU/L

## 2024-09-12 LAB — C3 AND C4
Complement C3, Serum: 220 mg/dL — ABNORMAL HIGH (ref 82–167)
Complement C4, Serum: 32 mg/dL (ref 12–38)

## 2024-09-17 ENCOUNTER — Ambulatory Visit: Payer: Self-pay | Admitting: Allergy

## 2024-09-17 NOTE — Progress Notes (Signed)
 Please CALL patient regarding results. He doesn't want mychart.  I reviewed the bloodwork.  Blood count, kidney function, liver function, electrolytes, thyroid , autoimmune screener, tryptase (checks for mast cell issues) and alpha gal (checks for red meat allergy ) were all normal which is great.   Negative to common foods including tree nuts, coconut. Continue to avoid coconut due to past history of rash with ingestion.   One of your inflammation markers was elevated but this is nonspecific and I don't think caused whatever symptoms you had.   Your sugars were high in the 200s.   Based on these results, no underlying issue found. If this occurs again let us  know.

## 2024-09-17 NOTE — Progress Notes (Unsigned)
 Cardiology Clinic Note   Patient Name: [name] Date of Encounter: 09/18/2024  Primary Care Provider:  Abdul Fine, MD (Inactive) Primary Cardiologist:  Redell Shallow, MD  Patient Profile    [name] 63 year old male presents to the clinic today for follow-up evaluation of his coronary artery disease and essential hypertension.  Past Medical History    Past Medical History:  Diagnosis Date   Anemia    Angio-edema    Arthritis    Back pain    CAD (coronary artery disease)    a. s/p DES to LAD in 05/2016   Cervical radiculopathy    Chronic diastolic CHF (congestive heart failure) (HCC)    Chronic pain    Deep vein thrombosis (HCC) 01/06/2016   Formatting of this note might be different from the original.  Formatting of this note might be different from the original. Last Assessment & Plan: Management per primary care. Last Assessment & Plan: Management per primary care.  Formatting of this note might be different from the original. Last Assessment & Plan: Management per primary care. Last Assessment & Plan: Management per primary care.   Depression    DVT (deep venous thrombosis) (HCC)    Dyspnea    GERD (gastroesophageal reflux disease)    Hematemesis    Hepatic steatosis    Hyperlipidemia    Hypertension    IBS (irritable bowel syndrome)    Morbid obesity (HCC)    Myocardial infarction (HCC)    OSA (obstructive sleep apnea)    oxygen  4L/[place] at night and PRN   Pancreatitis    PE (pulmonary thromboembolism) (HCC)    Peripheral neuropathy    PUD (peptic ulcer disease)    Pulmonary embolism (HCC) 07/15/2021   Renal disorder    Stroke [facility])    a. ?details unclear - not seen on imaging when he was admitted in 05/2017 for TIA symptoms which were felt due to cervical radiculopathy.   Thoracic aortic ectasia    a. 4.3cm ectatic ascending thoracic aorta by CT 06/2017.    Type 2 diabetes mellitus (HCC)    Past Surgical History:  Procedure Laterality  Date   ANTERIOR CERVICAL CORPECTOMY N/A 03/13/2024   Procedure: ANTERIOR CERVICAL DECOMPRESSION AND FUSION WITH PLATING CERVICAL FOUR-CERVICAL FIVE/CERVICAL FIVE-CERVICAL SIX;  Surgeon: Debby Dorn MATSU, MD;  Location: [facility] OR;  Service: Neurosurgery;  Laterality: N/A;   BIOPSY OF SKIN SUBCUTANEOUS TISSUE AND/OR MUCOUS MEMBRANE  07/09/2024   Procedure: BIOPSY, SKIN, SUBCUTANEOUS TISSUE, OR MUCOUS MEMBRANE;  Surgeon: Dianna Specking, MD;  Location: [facility] ENDOSCOPY;  Service: Gastroenterology;;   CARDIAC CATHETERIZATION N/A 05/31/2016   Procedure: Left Heart Cath and Coronary Angiography;  Surgeon: Peter M Jordan, MD;  Location: [facility] INVASIVE CV LAB;  Service: Cardiovascular;  Laterality: N/A;   CARDIAC CATHETERIZATION N/A 05/31/2016   Procedure: Intravascular Pressure Wire/FFR Study;  Surgeon: Peter M Jordan, MD;  Location: [facility] INVASIVE CV LAB;  Service: Cardiovascular;  Laterality: N/A;   CARDIAC CATHETERIZATION N/A 05/31/2016   Procedure: Coronary Stent Intervention;  Surgeon: Peter M Jordan, MD;  Location: [facility] INVASIVE CV LAB;  Service: Cardiovascular;  Laterality: N/A;   COLONOSCOPY N/A 07/17/2021   Procedure: COLONOSCOPY;  Surgeon: Dianna Specking, MD;  Location: [facility] ENDOSCOPY;  Service: Endoscopy;  Laterality: N/A;   COLONOSCOPY N/A 07/09/2024   Procedure: COLONOSCOPY;  Surgeon: Dianna Specking, MD;  Location: [facility] ENDOSCOPY;  Service: Gastroenterology;  Laterality: N/A;   COLONOSCOPY WITH PROPOFOL  N/A 04/29/2020   Procedure: COLONOSCOPY WITH PROPOFOL ;  Surgeon: Dianna Specking, MD;  Location: [facility] ENDOSCOPY;  Service: Endoscopy;  Laterality: N/A;   ESOPHAGOGASTRODUODENOSCOPY N/A 04/29/2020   Procedure: ESOPHAGOGASTRODUODENOSCOPY (EGD);  Surgeon: Dianna Specking, MD;  Location: [facility] ENDOSCOPY;  Service: Endoscopy;  Laterality: N/A;   ESOPHAGOGASTRODUODENOSCOPY N/A 07/09/2024   Procedure: EGD (ESOPHAGOGASTRODUODENOSCOPY);  Surgeon: Dianna Specking, MD;  Location: [facility];  Service:  Gastroenterology;  Laterality: N/A;   ESOPHAGOGASTRODUODENOSCOPY (EGD) WITH PROPOFOL  N/A 07/17/2021   Procedure: ESOPHAGOGASTRODUODENOSCOPY (EGD) WITH PROPOFOL ;  Surgeon: Dianna Specking, MD;  Location: [facility] ENDOSCOPY;  Service: Endoscopy;  Laterality: N/A;   GIVENS CAPSULE STUDY N/A 11/10/2022   Procedure: GIVENS CAPSULE STUDY;  Surgeon: Elicia Claw, MD;  Location: [facility] ENDOSCOPY;  Service: Gastroenterology;  Laterality: N/A;   LEFT HEART CATH AND CORONARY ANGIOGRAPHY N/A 12/08/2017   Procedure: LEFT HEART CATH AND CORONARY ANGIOGRAPHY;  Surgeon: Anner Alm ORN, MD;  Location: [facility] INVASIVE CV LAB;  Service: Cardiovascular;  Laterality: N/A;   LEFT HEART CATHETERIZATION WITH CORONARY ANGIOGRAM N/A 02/03/2014   Procedure: LEFT HEART CATHETERIZATION WITH CORONARY ANGIOGRAM;  Surgeon: Vinie KYM Maxcy, MD;  Location: [facility] CATH LAB;  Service: Cardiovascular;  Laterality: N/A;   left leg stent      POLYPECTOMY  04/29/2020   Procedure: POLYPECTOMY;  Surgeon: Dianna Specking, MD;  Location: [facility] ENDOSCOPY;  Service: Endoscopy;;   POLYPECTOMY  07/17/2021   Procedure: POLYPECTOMY;  Surgeon: Dianna Specking, MD;  Location: [facility] ENDOSCOPY;  Service: Endoscopy;;    Allergies  Allergies  Allergen Reactions   Coconut Fatty Acid Hives   Ibuprofen Other (See Comments)    Made gastric ulcers worse and stomach upset   Aleve  [Naproxen ] Other (See Comments)    DUE TO KIDNEYS   Nsaids Other (See Comments)    Stomach ulcers    Vascepa  [Icosapent  Ethyl (Epa Ethyl Ester) (Fish)] Nausea And Vomiting and Other (See Comments)    Headaches, chest pain, similar to sx of a stroke, hypotension    Other Hives, Rash and Other (See Comments)    Nut Allergy - ALL   Peanut-Containing Drug Products Hives and Rash    History of Present Illness    [name] has a PMH of coronary artery disease, morbid obesity, PE, essential hypertension, diastolic CHF and hyperlipidemia.  He underwent LHC and had PCI with  DES to his LAD August 2017.  He underwent repeat catheterization 2/19 which showed patent stents and normal LV function.  His left ventricular end-diastolic pressure was elevated at 27 mmHg.  Carotid Dopplers 4/19 showed 1-39% right and near normal left carotid artery.  He had nuclear stress testing 1/23 which showed an EF of 51% and no ischemia or infarct.  He had second pulmonary embolus 6/23.  His echocardiogram 5/23 showed normal LV function, mid left atrial enlargement.  He had intracranial MRA 10/23 which was negative.  He was seen in the emergency department 10/24 with dyspnea and chest pain.  He had CTA 10/24 which showed no pulmonary embolus, enlarged pulmonary artery.  His cardiac troponins and BNP were normal.  He was seen by me last 02/15/2022.  He was stable from a cardiac standpoint at that time and ready to go back to regular exercise.  He was seen in follow-up by Dr. Pietro 12/15/2023.  During that time he did note some dyspnea on exertion which he attributed to back pain.  He denied orthopnea and PND.  He was noted to have mild pedal edema.  He denied exertional chest discomfort and syncope.  He was admitted to  the hospital on 08/01/2024 and discharged on 08/07/2024.  He was noted to have hypokalemia.  He developed a sudden stutter and hypotension.  This was felt to be consistent with anaphylaxis.  The cause of his anaphylaxis was unclear.  He had no new medications or foods.  He developed DKA which resolved with treatment.  His AKI resolved with fluids.  He presents to the clinic today for follow-up evaluation and states that his breathing has returned to baseline.  He is using about 4 L nasal cannula at home.  In the clinic today he presents without oxygen .  He is ambulating with a cane.  He does note occasional episodes of chest discomfort.  He describes these as brief lasting for 2-3 minutes at most.  These are relieved with rest.  These have been ongoing.  They do not appear to be  related to cardiac issues.  He remains stable from a cardiac standpoint.  We reviewed his recent hospitalization.  He expressed understanding.  He has lost about 2 pounds since his last weigh-in.  His weight today is 356 pounds.  I reviewed his lab work.  I will order lipids today, give him heart healthy diet information, have him increase his physical activity as tolerated and plan follow-up in 6 to 9 months.  Today he denies chest pain, shortness of breath, lower extremity edema, fatigue, palpitations, melena, hematuria, hemoptysis, diaphoresis, weakness, presyncope, syncope, orthopnea, and PND.      Home Medications    Prior to Admission medications   Medication Sig Start Date End Date Taking? Authorizing Provider  ACCU-CHEK GUIDE TEST test strip  08/16/24   [provider]  albuterol  (VENTOLIN  HFA) 108 (90 Base) MCG/ACT inhaler Inhale 2 puffs into the lungs every 6 (six) hours as needed for wheezing or shortness of breath.    [provider]  atorvastatin  (LIPITOR ) 80 MG tablet Take 80 mg by mouth at bedtime. 07/04/23   [provider]  carvedilol  (COREG ) 6.25 MG tablet Take 6.25 mg by mouth 2 (two) times daily.    [provider]  COMBIVENT  RESPIMAT 20-100 MCG/ACT AERS respimat Inhale 1 puff into the lungs 4 (four) times daily as needed for wheezing or shortness of breath.    [provider]  Continuous Glucose Sensor (FREESTYLE LIBRE 2 PLUS SENSOR) MISC Inject 1 Device into the skin See admin instructions. Place 1 new sensor into the skin every 15 days    [provider]  Cyanocobalamin  (VITAMIN B12 PO) Take 1 tablet by mouth daily.    [provider]  ELIQUIS  5 MG TABS tablet TAKE 1 TABLET BY MOUTH TWICE A DAY 10/03/22   Amadeo Windell SAILOR, MD  EPINEPHrine  0.3 mg/0.3 mL IJ SOAJ injection Inject 0.3 mg into the muscle as needed for anaphylaxis. 08/07/24   Cheryle Page, MD  fenofibrate  (TRICOR ) 145 MG tablet TAKE 1 TABLET (145 MG  TOTAL) BY MOUTH DAILY. Patient taking differently: Take 145 mg by mouth at bedtime. 05/19/22   Emelia Josefa HERO, NP  ferrous sulfate  325 (65 FE) MG tablet Take 1 tablet (325 mg total) by mouth daily. Patient taking differently: Take 325 mg by mouth daily with breakfast. 11/16/22 09/09/24  Tobie Yetta HERO, MD  furosemide  (LASIX ) 40 MG tablet Take 40-80 mg by mouth See admin instructions. Take 80 mg by mouth in the morning and 40 mg at night    [provider]  gabapentin  (NEURONTIN ) 800 MG tablet Take 800 mg by mouth in the morning  and at bedtime.    [provider]  glipiZIDE  (GLUCOTROL ) 10 MG tablet Take 10 mg by mouth 2 (two) times daily. 05/30/17   [provider]  HUMULIN  R U-500 KWIKPEN 500 UNIT/ML kwikpen Inject 95-120 Units into the skin See admin instructions. Inject 95-120 units into the skin three times a day with meals, per sliding scale 06/04/20   [provider]  Insulin  Pen Needle 31G X 5 MM MISC Use 1 needle daily to inject insulin  as prescribed 06/18/17   Ricky Fines, MD  isosorbide  dinitrate (ISORDIL ) 30 MG tablet Take 30 mg by mouth 2 (two) times daily. 09/01/23   [provider]  JARDIANCE 10 MG TABS tablet Take 10 mg by mouth daily.    [provider]  latanoprost (XALATAN) 0.005 % ophthalmic solution Apply 1 drop to eye. 08/13/24   [provider]  lisinopril  (ZESTRIL ) 20 MG tablet Take 20 mg by mouth. 06/02/23   [provider]  Menthol , Topical Analgesic, (BIOFREEZE EX) Apply 1 application  topically as needed (knee pain).    [provider]  metolazone  (ZAROXOLYN ) 2.5 MG tablet Take 2.5 mg by mouth in the morning.    [provider]  nitroGLYCERIN  (NITROSTAT ) 0.4 MG SL tablet Place 1 tablet (0.4 mg total) under the tongue every 5 (five) minutes as needed for chest pain. 02/06/19   Raford Riggs, MD  ondansetron  (ZOFRAN ) 4 MG tablet Take 8 mg by mouth daily as needed for vomiting or nausea.  04/21/20   [provider]  Oxycodone  HCl 20 MG TABS Take 20 mg by mouth See admin instructions. Take 20 mg by mouth in the morning and an additional 20 mg up to three times a day as needed for pain    [provider]  pantoprazole  (PROTONIX ) 40 MG tablet Take 40 mg by mouth daily before breakfast. 05/26/22   [provider]  potassium chloride  (MICRO-K ) 10 MEQ CR capsule Take by mouth. 08/13/24   [provider]  potassium chloride  SA (KLOR-CON  M) 20 MEQ tablet Take 20 mEq by mouth 2 (two) times daily. 02/27/24   [provider]  sertraline  (ZOLOFT ) 50 MG tablet Take 50 mg by mouth every morning. 12/17/20   [provider]  SSD 1 % cream Apply 1 Application topically daily as needed (open wounds). 02/13/24   [provider]  tiZANidine  (ZANAFLEX ) 4 MG tablet Take 4 mg by mouth See admin instructions. Take 4 mg by mouth at bedtime and an additional 4 mg up to three times a day as needed for muscle spasms    [provider]  traZODone  (DESYREL ) 50 MG tablet Take 1 tablet (50 mg total) by mouth at bedtime. 12/15/16   Burns, Alexa R, MD  triamcinolone  cream (KENALOG ) 0.1 % Apply 1 Application topically daily as needed (itching).    [provider]  Vitamin D , Ergocalciferol , (DRISDOL) 1.25 MG (50000 UT) CAPS capsule Take 50,000 Units by mouth every Tuesday. 08/15/19   [provider]    Family History    Family History  Problem Relation Age of Onset   Angioedema Mother    Hypertension Mother    Diabetes Mother    Breast cancer Mother    Cancer Father    Hypertension Sister    Diabetes Sister    Hypertension Brother    Diabetes Brother    He indicated that his mother is alive. He indicated that his father is deceased. He indicated that the status  of his sister is unknown. He indicated that the status of his brother is unknown. He indicated that his maternal grandmother is deceased. He indicated that his maternal  grandfather is deceased. He indicated that his paternal grandmother is deceased. He indicated that his paternal grandfather is deceased.  Social History    Social History   Socioeconomic History   Marital status: Single    Spouse name: Not on file   Number of children: 3   Years of education: 12   Highest education level: Not on file  Occupational History   Occupation: Disabled  Tobacco Use   Smoking status: Never    Passive exposure: Past   Smokeless tobacco: Never  Vaping Use   Vaping status: Never Used  Substance and Sexual Activity   Alcohol  use: No   Drug use: Not Currently    Types: Marijuana   Sexual activity: Not Currently    Partners: Female    Comment: SINGLE  Other Topics Concern   Not on file  Social History Narrative   Independent and ambulatory with cane.   Lives at home alone.   Right-handed.   1-2 cups caffeine per day.   Social Drivers of Corporate Investment Banker Strain: Not on File (02/10/2022)   Received from [employer]    Financial Resource Strain: 0  Food Insecurity: No Food Insecurity (08/01/2024)   Hunger Vital Sign    Worried About Running Out of Food in the Last Year: Never true    Ran Out of Food in the Last Year: Never true  Transportation Needs: No Transportation Needs (08/01/2024)   PRAPARE - Administrator, Civil Service (Medical): No    Lack of Transportation (Non-Medical): No  Physical Activity: Not on File (02/10/2022)   Received from [facility]   Physical Activity    Physical Activity: 0  Stress: Not on File (02/10/2022)   Received from [facility]   Stress    Stress: 0  Social Connections: Not on File (07/06/2023)   Received from [facility]   Social Connections    Connectedness: 0  Intimate Partner Violence: Patient Unable To Answer (08/01/2024)   Humiliation, Afraid, Rape, and Kick questionnaire    Fear of Current or Ex-Partner: Patient unable to answer    Emotionally Abused: Patient unable to answer     Physically Abused: Patient unable to answer    Sexually Abused: Patient unable to answer     Review of Systems    General:  No chills, fever, night sweats or weight changes.  Cardiovascular:  No chest pain, dyspnea on exertion, edema, orthopnea, palpitations, paroxysmal nocturnal dyspnea. Dermatological: No rash, lesions/masses Respiratory: No cough, dyspnea Urologic: No hematuria, dysuria Abdominal:   No nausea, vomiting, diarrhea, bright red blood per rectum, melena, or hematemesis Neurologic:  No visual changes, wkns, changes in mental status. All other systems reviewed and are otherwise negative except as noted above.  Physical Exam    VS:  BP 124/80   Pulse 77   Ht 5' 10 (1.778 m)   Wt (!) 356 lb 3.2 oz (161.6 kg)   SpO2 92%   BMI 51.11 kg/m  , BMI Body mass index is 51.11 kg/m. GEN: Well nourished, well developed, in no acute distress. HEENT: normal. Neck: Supple, no JVD, carotid bruits, or masses. Cardiac: RRR, no murmurs, rubs, or gallops. No clubbing, cyanosis, edema.  Radials/DP/PT 2+ and equal bilaterally.  Respiratory:  Respirations regular and unlabored, clear to auscultation bilaterally.  GI: Soft, nontender, nondistended, BS + x 4. MS: no deformity or atrophy. Skin: warm and dry, no rash. Neuro:  Strength and sensation are intact. Psych: Normal affect.  Accessory Clinical Findings    Recent Labs: 06/23/2024: B Natriuretic Peptide 13.7 08/07/2024: Magnesium  2.2 09/09/2024: ALT 21; BUN 14; Creatinine, Ser 1.00; Hemoglobin 13.6; Platelets 241; Potassium 3.4; Sodium 141; TSH 1.220   Recent Lipid Panel    Component Value Date/Time   CHOL 97 (L) 12/27/2021 0951   TRIG 248 (H) 12/27/2021 0951   HDL 28 (L) 12/27/2021 0951   CHOLHDL 3.5 12/27/2021 0951   CHOLHDL 4.3 03/08/2019 1008   VLDL 43 (H) 12/08/2017 0212   LDLCALC 31 12/27/2021 0951   LDLCALC  03/08/2019 1008     Comment:     . LDL cholesterol not calculated. Triglyceride levels greater than 400  mg/dL invalidate calculated LDL results. . Reference range: <100 . Desirable range <100 mg/dL for primary prevention;   <70 mg/dL for patients with CHD or diabetic patients  with > or = 2 CHD risk factors. SABRA LDL-C is now calculated using the Martin-Hopkins  calculation, which is a validated novel method providing  better accuracy than the Friedewald equation in the  estimation of LDL-C.  Gladis APPLETHWAITE et al. SANDREA. 7986;689(80): 2061-2068  (http://education.[facility].com/faq/FAQ164)    LDLDIRECT 40 12/27/2021 0951         ECG personally reviewed by me today-none today.     Echocardiogram 04/22/2019 IMPRESSIONS     1. The left ventricle has normal systolic function with an ejection  fraction of 60-65%. The cavity size was normal. There is mildly increased  left ventricular wall thickness. Left ventricular diastolic Doppler  parameters are consistent with impaired  relaxation.   2. The right ventricle has normal systolic function. The cavity was  normal.   3. There is mild dilatation of the ascending aorta measuring 38 mm.   4. The aortic valve is tricuspid.   5. Normal LV systolic function; mild diastolic dysfunction; mild LVH.   6. The mitral valve is grossly normal.    Echocardiogram 06/28/2021   IMPRESSIONS     1. Left ventricular ejection fraction, by estimation, is 60 to 65%. The  left ventricle has normal function. The left ventricle has no regional  wall motion abnormalities. There is moderate left ventricular hypertrophy.   2. Right ventricular systolic function is normal. The right ventricular  size is normal.   3. The mitral valve is normal in structure. No evidence of mitral valve  regurgitation. No evidence of mitral stenosis.   4. The aortic valve is normal in structure. Aortic valve regurgitation is  not visualized. No aortic stenosis is present.   5. The inferior vena cava is dilated in size with >50% respiratory  variability, suggesting right  atrial pressure of 8 mmHg.   FINDINGS   Left Ventricle: Left ventricular ejection fraction, by estimation, is 60  to 65%. The left ventricle has normal function. The left ventricle has no  regional wall motion abnormalities. Definity  contrast agent was given IV  to delineate the left ventricular   endocardial borders. The left ventricular internal cavity size was normal  in size. There is moderate left ventricular hypertrophy.   Right Ventricle: The right ventricular size is normal. No increase in  right ventricular wall thickness. Right ventricular systolic function is  normal.   Left Atrium: Left atrial size was normal in size.   Right Atrium: Right atrial size was normal in size.  Pericardium: There is no evidence of pericardial effusion.   Mitral Valve: The mitral valve is normal in structure. No evidence of  mitral valve stenosis.   Tricuspid Valve: The tricuspid valve is normal in structure. Tricuspid  valve regurgitation is not demonstrated. No evidence of tricuspid  stenosis.   Aortic Valve: The aortic valve is normal in structure. Aortic valve  regurgitation is not visualized. No aortic stenosis is present.   Pulmonic Valve: The pulmonic valve was normal in structure. Pulmonic valve  regurgitation is not visualized. No evidence of pulmonic stenosis.   Aorta: The aortic root is normal in size and structure.   Venous: The inferior vena cava is dilated in size with greater than 50%  respiratory variability, suggesting right atrial pressure of 8 mmHg.   IAS/Shunts: No atrial level shunt detected by color flow Doppler.    Echocardiogram 06/19/2024  IMPRESSIONS     1. Left ventricular ejection fraction, by estimation, is 60 to 65%. The  left ventricle has normal function. Left ventricular endocardial border  not optimally defined to evaluate regional wall motion, though appears  grossly normal in short axis. Definity    considered but IV unable to be placed. There  is moderate concentric left  ventricular hypertrophy. Left ventricular diastolic parameters were  normal.   2. Right ventricular systolic function is normal. The right ventricular  size is normal. Tricuspid regurgitation signal is inadequate for assessing  PA pressure.   3. The mitral valve is normal in structure. Trivial mitral valve  regurgitation. No evidence of mitral stenosis.   4. The aortic valve is grossly normal. Unable to determine aortic valve  morphology due to image quality. Aortic valve regurgitation is not  visualized. No aortic stenosis is present.   5. Aortic dilatation noted. There is mild dilatation of the ascending  aorta, measuring 41 mm.   6. IVC not well visualized.   FINDINGS   Left Ventricle: Left ventricular ejection fraction, by estimation, is 60  to 65%. The left ventricle has normal function. Left ventricular  endocardial border not optimally defined to evaluate regional wall motion.  The left ventricular internal cavity  size was normal in size. There is moderate concentric left ventricular  hypertrophy. Left ventricular diastolic parameters were normal.   Right Ventricle: The right ventricular size is normal. No increase in  right ventricular wall thickness. Right ventricular systolic function is  normal. Tricuspid regurgitation signal is inadequate for assessing PA  pressure.   Left Atrium: Left atrial size was normal in size.   Right Atrium: Right atrial size was normal in size.   Pericardium: There is no evidence of pericardial effusion.   Mitral Valve: The mitral valve is normal in structure. Trivial mitral  valve regurgitation. No evidence of mitral valve stenosis.   Tricuspid Valve: The tricuspid valve is normal in structure. Tricuspid  valve regurgitation is not demonstrated. No evidence of tricuspid  stenosis.   Aortic Valve: The aortic valve is grossly normal. Aortic valve  regurgitation is not visualized. No aortic stenosis is  present.   Pulmonic Valve: The pulmonic valve was normal in structure. Pulmonic valve  regurgitation is trivial. No evidence of pulmonic stenosis.   Aorta: Aortic dilatation noted. There is mild dilatation of the ascending  aorta, measuring 41 mm.   Venous: The inferior vena cava was not well visualized.   IAS/Shunts: No atrial level shunt detected by color flow Doppler.   Assessment & Plan   1.  Coronary artery disease-no chest pain  today.  Denies exertional chest discomfort.  Underwent catheterization with PCI and DES to his LAD in 2017.  Had a subsequent catheterization 2/19 which showed patent stents and normal LV function.  Stress testing 1/23 showed normal EF and no ischemia. Heart healthy low-sodium diet Increase physical activity as tolerated Continue Imdur , atorvastatin , fenofibrate , carvedilol    Chronic diastolic CHF-euvolemic today.  Weight stable. Heart healthy low-sodium diet Continue carvedilol ,, Imdur , Jardiance, metolazone , potassium  Hyperlipidemia-LDL 31 on 3/23. 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  Reports compliance with Eliquis .  Denies bleeding issues.  He was positive for PE in the setting of COVID infection.  CTA 12/22 showed resolution of PE. Continue Eliquis  Follows with hematology  Essential hypertension-BP today 124/80. Maintain blood pressure log Continue Imdur , carvedilol   Disposition: Follow-up with Dr. Pietro or me in 6-9 months.   Josefa HERO. Dywane Peruski NP-C     09/18/2024, 8:53 AM [facility] [address] [place], [place] [address] Office [phone]    Notice: This dictation was prepared with Dragon dictation along with smaller phrase technology. Any transcriptional errors that result from this process are unintentional and may not be corrected upon review.   I spent 14 minutes examining this patient, reviewing medications, and  using patient centered shared decision making involving their cardiac care.   I spent  20 minutes reviewing past medical history,  medications, and prior cardiac tests.

## 2024-09-18 ENCOUNTER — Ambulatory Visit: Attending: General Practice | Admitting: General Practice

## 2024-09-18 ENCOUNTER — Encounter: Payer: Self-pay | Admitting: General Practice

## 2024-09-18 VITALS — BP 124/80 | HR 77 | Ht 70.0 in | Wt 356.2 lb

## 2024-09-18 DIAGNOSIS — I25119 Atherosclerotic heart disease of native coronary artery with unspecified angina pectoris: Secondary | ICD-10-CM | POA: Insufficient documentation

## 2024-09-18 DIAGNOSIS — I2699 Other pulmonary embolism without acute cor pulmonale: Secondary | ICD-10-CM | POA: Diagnosis not present

## 2024-09-18 DIAGNOSIS — I5032 Chronic diastolic (congestive) heart failure: Secondary | ICD-10-CM | POA: Insufficient documentation

## 2024-09-18 DIAGNOSIS — I10 Essential (primary) hypertension: Secondary | ICD-10-CM | POA: Insufficient documentation

## 2024-09-18 DIAGNOSIS — E785 Hyperlipidemia, unspecified: Secondary | ICD-10-CM | POA: Diagnosis present

## 2024-09-18 NOTE — Patient Instructions (Signed)
 Thank you for choosing  HeartCare!     Medication Instructions:  No medication changes were made during today's visit.   Maintain heart healthy diet.  Maintain physical activity. *If you need a refill on your cardiac medications before your next appointment, please call your pharmacy*   Lab Work: Lipid panel If you have labs (blood work) drawn today and your tests are completely normal, you will receive your results only by: MyChart Message (if you have MyChart) OR A paper copy in the mail If you have any lab test that is abnormal or we need to change your treatment, we will call you to review the results.   Testing/Procedures: No procedures were ordered during today's visit.   Your next appointment:   6-9 month(s)   Provider:   Redell Shallow, MD or Josefa Beauvais     Follow-Up: At [facility], you and your health needs are our priority.  As part of our continuing mission to provide you with exceptional heart care, we have created designated Provider Care Teams.  These Care Teams include your primary Cardiologist (physician) and Advanced Practice Providers (APPs -  Physician Assistants and Nurse Practitioners) who all work together to provide you with the care you need, when you need it. We recommend signing up for the patient portal called MyChart.  Sign up information is provided on this After Visit Summary.  MyChart is used to connect with patients for Virtual Visits (Telemedicine).  Patients are able to view lab/test results, encounter notes, upcoming appointments, etc.  Non-urgent messages can be sent to your provider as well.   To learn more about what you can do with MyChart, go to [email or web address].

## 2024-09-23 ENCOUNTER — Ambulatory Visit: Admitting: Cardiology

## 2024-09-24 ENCOUNTER — Ambulatory Visit: Payer: Self-pay | Admitting: General Practice

## 2024-09-24 DIAGNOSIS — Z79899 Other long term (current) drug therapy: Secondary | ICD-10-CM

## 2024-09-24 LAB — LIPID PANEL
Chol/HDL Ratio: 4 ratio (ref 0.0–5.0)
Cholesterol, Total: 133 mg/dL (ref 100–199)
HDL: 33 mg/dL — ABNORMAL LOW (ref >39)
LDL Chol Calc (NIH): 55 mg/dL (ref 0–99)
Triglycerides: 286 mg/dL — ABNORMAL HIGH (ref 0–149)
VLDL Cholesterol Cal: 45 mg/dL — ABNORMAL HIGH (ref 5–40)

## 2024-09-24 MED ORDER — FENOFIBRATE 160 MG PO TABS: 160.0000 mg | ORAL_TABLET | Freq: Every day | ORAL | 3 refills | Status: AC

## 2024-09-26 ENCOUNTER — Other Ambulatory Visit (HOSPITAL_COMMUNITY): Payer: Self-pay

## 2024-09-26 ENCOUNTER — Telehealth: Payer: Self-pay | Admitting: Pharmacy Technician

## 2024-09-26 NOTE — Progress Notes (Unsigned)
 Cardiology Office Note   Date:  10/02/2024  ID:  Juan, Stein 09-Oct-1961, MRN [id number] PCP: Montgomery Sherlean LABOR, MD  [facility] Providers Cardiologist:  Redell Shallow, MD { Click to update primary MD,subspecialty MD or APP then REFRESH:1}    PMH CAD S/p DES to LAD 05/2016 Cardiac cath February 2019 showed patent stent Pulmonary embolus Hyperlipidemia Morbid obesity Type 2 diabetes Chronic HFpEF  Referred to Advanced lipid disorders clinic and seen by Dr. Mona 04/24/2019.  He had previously seen [name] in 2015 following cardiac catheterization which showed moderate LAD disease at the time.  He was managed medically.  At the time of referral he had markedly elevated triglycerides near 600.  He also had a history of pancreatitis in the past and chronic pain relieved by steroids.  A1c was 9.8%.  He ultimately had progression of CAD and had stent to LAD in August 2017.  His care was transitioned to Dr. Shallow.  He was prescribed Vascepa  but had possible anaphylaxis with significant episode of hypotension.  He had several admissions for hyperglycemia.  Triglycerides had risen to 529.  A1c had trended all the way up to 12.4 in 2021.  He had PE in the setting of COVID infection with CTA 09/2021 which showed resolution of PE.  He is followed by hematology and has remained on Eliquis .  He had some improvement in triglycerides on fenofibrate .  At office visit 12/27/2021, recent lipids reveal triglycerides of 499, just slightly below the 500 or higher cutoff for the CORE triglyceride trial.  He was on max tolerated fenofibrate  and statin.  Last lipid clinic visit was 02/22/2023.  He had recently been hospitalized with acute renal failure.  Follow-up with nephrology revealed normal creatinine.  He was taken off diuretic and ACE inhibitor.  Recent lipid panel revealed total cholesterol 124, triglycerides 345, HDL 29, and LDL 43.  Carotid Doppler revealed mild bilateral carotid  disease.  He was unable to take fish oil  and fenofibrate  dose needed to be adjusted due to renal function.  Seen by Juan Beauvais, NP, for general cardiology follow-up on 09/18/2024.  He was doing well from a cardiac perspective and was advised to follow-up in 6 to 9 months.  Lipid panel 09/23/2024 revealed total cholesterol 133, triglycerides 286, HDL 33, LDL-C 55.  History of Present Illness [name] is a 63 y.o. male ***  On home oxygen  Eats mostly home cooked meals Cut back on bread Rare nitroglycerine 0rtho problems limit activity  ROS: ***  Studies Reviewed      ***  No results found for: LIPOA  Risk Assessment/Calculations {Does this patient have ATRIAL FIBRILLATION?[phone]}         Physical Exam VS:  BP 120/72 (BP Location: Right Arm, Patient Position: Sitting, Cuff Size: Large)   Pulse 71   Ht 5' 10 (1.778 m)   Wt (!) 356 lb (161.5 kg)   SpO2 91%   BMI 51.08 kg/m    Wt Readings from Last 3 Encounters:  10/02/24 (!) 356 lb (161.5 kg)  09/18/24 (!) 356 lb 3.2 oz (161.6 kg)  09/09/24 (!) 358 lb (162.4 kg)    GEN: Well nourished, well developed in no acute distress NECK: No JVD; No carotid bruits CARDIAC: ***RRR, no murmurs, rubs, gallops RESPIRATORY:  Clear to auscultation without rales, wheezing or rhonchi  ABDOMEN: Soft, non-tender, non-distended EXTREMITIES:  No edema; No deformity   ASSESSMENT AND PLAN ***    {Are you ordering a CV  Procedure (e.g. stress test, cath, DCCV, TEE, etc)?   Press F2        :[id number]}  Dispo: ***  Signed, Rosaline Bane, NP-C

## 2024-09-26 NOTE — Telephone Encounter (Signed)
 Pharmacy Patient Advocate Encounter  Received notification from [place] MEDICAID that Prior Authorization for fenofibrate  has been APPROVED from 09/26/24 to 09/26/25   PA #/Case ID/Reference #: [id number]

## 2024-09-26 NOTE — Telephone Encounter (Signed)
    Pharmacy Patient Advocate Encounter   Received notification from Onbase that prior authorization for fenofibrate  160mg  is required/requested.   Insurance verification completed.   The patient is insured through [facility] MEDICAID.   Per test claim: PA required; PA submitted to above mentioned insurance via Latent Key/confirmation #/EOC [id number] Status is pending

## 2024-10-02 ENCOUNTER — Encounter (HOSPITAL_BASED_OUTPATIENT_CLINIC_OR_DEPARTMENT_OTHER): Payer: Self-pay | Admitting: Nurse Practitioner

## 2024-10-02 ENCOUNTER — Ambulatory Visit (INDEPENDENT_AMBULATORY_CARE_PROVIDER_SITE_OTHER): Admitting: Nurse Practitioner

## 2024-10-02 DIAGNOSIS — I5032 Chronic diastolic (congestive) heart failure: Secondary | ICD-10-CM

## 2024-10-02 DIAGNOSIS — E785 Hyperlipidemia, unspecified: Secondary | ICD-10-CM

## 2024-10-02 DIAGNOSIS — E118 Type 2 diabetes mellitus with unspecified complications: Secondary | ICD-10-CM | POA: Diagnosis not present

## 2024-10-02 DIAGNOSIS — Z794 Long term (current) use of insulin: Secondary | ICD-10-CM

## 2024-10-02 DIAGNOSIS — R0609 Other forms of dyspnea: Secondary | ICD-10-CM

## 2024-10-02 DIAGNOSIS — I25119 Atherosclerotic heart disease of native coronary artery with unspecified angina pectoris: Secondary | ICD-10-CM | POA: Diagnosis not present

## 2024-10-02 NOTE — Patient Instructions (Signed)
 Medication Instructions:  Your physician recommends that you continue on your current medications as directed. Please refer to the Current Medication list given to you today.  *If you need a refill on your cardiac medications before your next appointment, please call your pharmacy*  Lab Work: None Ordered If you have labs (blood work) drawn today and your tests are completely normal, you will receive your results only by: MyChart Message (if you have MyChart) OR A paper copy in the mail If you have any lab test that is abnormal or we need to change your treatment, we will call you to review the results.  Testing/Procedures: None Ordered  Follow-Up: At [facility], you and your health needs are our priority.  As part of our continuing mission to provide you with exceptional heart care, our providers are all part of one team.  This team includes your primary Cardiologist (physician) and Advanced Practice Providers or APPs (Physician Assistants and Nurse Practitioners) who all work together to provide you with the care you need, when you need it.  Your next appointment:   1 year(s)  Provider:   K. Chad Hilty, MD   Rosaline Bane, NP  We recommend signing up for the patient portal called MyChart.  Sign up information is provided on this After Visit Summary.  MyChart is used to connect with patients for Virtual Visits (Telemedicine).  Patients are able to view lab/test results, encounter notes, upcoming appointments, etc.  Non-urgent messages can be sent to your provider as well.   To learn more about what you can do with MyChart, go to [email or web address].   Other Instructions Tackling Obesity with Lifestyle Changes  Obesity- What is it? And What can we do about it?  Obesity is a chronic complex disease defined as excessive fat deposits that can have a negative effect on our health. It can lead to many other diseases including type 2 diabetes.  Weight gain occurs when  the amount of energy (calories) we consume is greater than the amount we use.  When our energy output is greater than our energy input we lose weight. The basic concept is simple, but in reality, its much more complicated.  Unfortunately, in some people, our bodies have many ways it can compensate when we try to eat less and move more which can prevent us  from changing our weight. This can lead to some people having a much more difficult time losing weight even when they put healthy habits into practice. This can be frustrating. We want to focus on healthy habits, physical activity and how we feel, and less the number on the scale.  Food As Energy  Calories  Calories is just a unit of measurement for energy.  Counting calories is not required to lose weight but counting for a short period of time can:   help you learn good portion sizes   Learn what your true energy needs are.   Help you be more aware of your snacking or grazing habits  To help calculate how many calories you should be eating, the NIH has a great body weight planner calculator at beveragebuggy.si  Types of Energy Expenditure  Basal Metabolic Rate (BMR) Energy that our bodies use to preform everyday tasks. More muscle mass through resistance training can increase this a small amount  Thermic Effect of Food The amount of energy that it takes to breakdown the food we eat. This will be highest when we eat protein and fiber rich foods  Exercise  Energy Expenditure The amount of energy used during formal exercise (walking, biking, weightlifting)  Non-exercise activity thermogenesis (NEAT) The amount of energy spent on activities that are not formal exercise (standing, fidgeting). Therefore, it is not only important to do formal exercise but also move around throughout the day.  Managing The Meal  Macro nutrients (carbohydrates, fats and protein, fiber, water)  Micronutrients (vitamins, minerals)  Dietary  Fiber  Benefits Examples Cautions  Soluble fiber  Decreases cholesterol  improve blood sugar control,  Feeds our gut bacteria  Allows us  to feel fuller for longer so we eat less  fruits  oats  barley  legumes  peas  Beans  vegetables (broccoli) and root vegetables (carrots) Add fiber into your diet slowly and be sure to drink at least 8 cups of water a day. This will help limit gas, bloating, diarrhea, or constipation.  Insoluble Fiber  Improves digestive health by making stool easier to pass  Allows us  to feel fuller for longer so we eat less  whole grains  nuts  seeds  skin of fruit  vegetables (green beans, zucchini, cauliflower)  Tricks to add more fiber to your diet   Add beans (pinto, kidney, lima, navy and garbanzo) to salads, ground meat or brown rice   Add nuts or seeds and or fresh/frozen fruit to yogurt, cottage cheese, salads or steel cut oats   Cut up vegetables and eat with hummus   Look for unsweetened whole grain cereals with at least 5g of fiber per serving   Switch to whole grain bread. Look for bread that has whole grain flour as the first ingredient and has more fiber than carbs if you were to multiple the fiber x 10.   Try bulgar, barely, quinoa, buckwheat, brown rice wild rice instead of white rice   Keep frozen vegetables on hand to add to dishes or soups  Meal Planning:  Meal planning is the key to setting you up for success. Here are some examples of healthy meal options.  Breakfast  Option 1: Omelette with vegetables (1 egg, spinach, mushrooms, or other vegetable of your choice), 2 slices whole-grain toast, tip of thumb size butter or soft margarine,  cup low-fat milk or yogurt  Option 2: steel-cut rolled oats (? cup dry), 1 tbsp peanut butter added to cooked oats,  cup low-fat milk.  Option 3: 2 slices whole-grain or rye toast with avocado spread ( small avocado mased with herbs and pepper to taste), 1 poached egg or sunnyside  up (cooked to your liking)  Option 4:  cup plain 0% Greek yogurt topped with  cup berries and  cup walnuts or almonds, 2 slices whole-grain or rye toast, tip of thumb size soft margarine/butter  Lunch:  Option 1: 2 cups red lentil soup, green salad with 1 tbsp homemade vinaigrette (extra virgin olive oil and vinegar of choice plus spices)  Option 2: 3 oz. roasted chicken, 2 slices whole-grain bread, 2 tsp mayonnaise, mustard, lettuce, tomato if desired, 1 fruit (example: medium-sized apple or small pear)  Option 3: 3 oz. tuna packed in water, 1 whole-wheat pita (6 inch), 2 tsp mayonnaise, lettuce, tomato, or other non-starchy vegetable of your choice, 1 fruit (example: medium-sized apple or small pear)  Option 4: 1 serving of garden veggie buddha bowl with lentils and tahini sauce and 1 cup berries topped with  cup plain 0% Greek yogurt  Dinner:  Option 1: 1 serving roasted cauliflower salad, 3-4 oz. grilled or baked pork loin  chop, 1/2 cup mashed potato, or brown rice or quinoa  Option 2: 1 serving fish (baked, grilled or air fried), green salad, 1 tbsp homemade vinaigrette,  cup cooked couscous  Option 3: 1 cup cooked whole grained pasta (example: spaghetti, spirals, macaroni),  cup favorite pasta sauce (preferably homemade), 3-4 oz. grilled or baked chicken, green salad, 1 tbsp homemade vinaigrette  Option 4: 1 serving oven roasted salmon,  cup mashed sweet potato or couscous or brown rice or quinoa, broccoli (steamed or roasted)  Healthy snacks:   Carrots or celery with 1 tbsp of hummus   1 medium-sized fruit (apple or orange)   1 cup plain 0% Greek yogurt with  cup berries   Half apple, sliced, with 1 tbsp (15 mL) peanut or almond butter  Dining out:  Eating away from home has become a part of many peoples lifestyle. Making healthy choices when you are eating out is important too. Portion size is an important part of healthy choices. Most branded fast-food  places provide calories, sodium, and fat content for their menu items. www.calorieking.com would be great resource to find nutrition facts for your favorite brands and fast-food restaurants. Company specific website can be chief technology officer for nutrition information for their items. (e.g. www.mcdonalds.com or www.nutritionix.com/[place]/menu/premium)  Here are some tips to help you make wise food choices when you are dining out.  Chose more often Avoid  Beverages   Choose more often: Water, low fat milk  Sugar-free/diet drinks  Unsweet tea or coffee    Avoid: Milkshakes, fruit drinks, regular pop  Alcohol , specialty drinks (e.g. iced cappuccino)  Fast food  Choose more often:  Garden salad  Mini subs, pita sandwiches ect with extra vegetables  plain burgers, grilled chicken  Vegetarian or cheese pizza with whole-grain crust    Avoid: Burgers/sandwiches with bacon, cheese, and high-fat sauces  French fries, fried chicken, fried fish, poutine, hash browns  Pizza with processed meats  Starters   Choose more often: Raw vegetables, salads (garden, spinach, fruit)  clear or vegetable soups  Seafood cocktail  Whole-grain breads and rolls    Avoid: Salads with high-fat dressings or toppings  Creamy soups  Wings, egg rolls  onion rings, nachos  White or garlic bread  Main courses Grains & Starches (amount equal to  of your plate)  Choose more often:  Oatmeal, high-fiber/lower-sugar cereals  Whole-grain breads, rice, pasta, barley, couscous  Sweet potatoes    Avoid: Sugary, low-fiber cereals  Large bagels, muffins, croissants, white bread  French fries, hash browns, fried rice   Meat and alternative (amount equal to  of your plate)  Choose more often:  Lean meats, poultry, fish, eggs, low-fat cheese  Tofu, vegetable protein Legumes (e.g. lentils, chickpeas, beans)    Avoid: High-salt and/or high-fat meats (e.g. ribs, wings, sausages, wieners, processed lunch  meats, imposter meats)   Vegetables (amount equal to  of your plate)  Choose more often:  Salads (Greek, garden, spinach), plain vegetables   Avoid:  Salads with creamy, high-fat dressings and   Vegetables on sandwiches ect toppings like bacon bits, croutons, cheese  Desserts  Choose more often:  Fresh fruit, frozen yogourt, skim milk latte    Avoid: Cakes, pies, pastries, ice cream, cheesecake

## 2024-10-04 ENCOUNTER — Encounter (HOSPITAL_BASED_OUTPATIENT_CLINIC_OR_DEPARTMENT_OTHER): Payer: Self-pay | Admitting: Nurse Practitioner

## 2024-11-07 ENCOUNTER — Encounter: Payer: Self-pay | Admitting: Podiatry

## 2024-11-07 ENCOUNTER — Ambulatory Visit: Admitting: Podiatry

## 2024-11-07 DIAGNOSIS — E1142 Type 2 diabetes mellitus with diabetic polyneuropathy: Secondary | ICD-10-CM | POA: Diagnosis not present

## 2024-11-07 DIAGNOSIS — N179 Acute kidney failure, unspecified: Secondary | ICD-10-CM

## 2024-11-07 DIAGNOSIS — B351 Tinea unguium: Secondary | ICD-10-CM

## 2024-11-07 DIAGNOSIS — M79675 Pain in left toe(s): Secondary | ICD-10-CM

## 2024-11-07 DIAGNOSIS — M79674 Pain in right toe(s): Secondary | ICD-10-CM | POA: Diagnosis not present

## 2024-11-07 NOTE — Progress Notes (Signed)
 This patient presents  to my office for at risk foot care.  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  This patient presents for at risk foot care today.  General Appearance  Alert, conversant and in no acute stress.  Vascular  Dorsalis pedis   pulses are absent bilaterally. Posterior tibial pulses are absent due to swelling. Capillary return is within normal limits  bilaterally. Temperature is within normal limits  bilaterally.  Venous stasis  B/L.  Neurologic  Senn-Weinstein monofilament wire test within normal limits  bilaterally. Muscle power within normal limits bilaterally.  Nails Thick disfigured discolored nails with subungual debris  from hallux to fifth toes bilaterally. No evidence of bacterial infection or drainage bilaterally.  Orthopedic  No limitations of motion  feet .  No crepitus or effusions noted.  No bony pathology or digital deformities noted.  Skin  dry skin with no porokeratosis noted bilaterally.  No signs of infections or ulcers noted.     Onychomycosis  Pain in right toes  Pain in left toes  Consent was obtained for treatment procedures.   Mechanical debridement of nails 1-5  bilaterally performed with a nail nipper.  Filed with dremel without incident.    Return office visit    3 months                 Told patient to return for periodic foot care and evaluation due to potential at risk complications.   Cordella Bold ROSENA weldon

## 2024-12-26 ENCOUNTER — Institutional Professional Consult (permissible substitution): Admitting: Neurology

## 2025-02-05 ENCOUNTER — Ambulatory Visit: Admitting: Podiatry
